# Patient Record
Sex: Female | Born: 1960
Health system: Southern US, Community
[De-identification: ages and names within clinical notes are randomized; demographics above are authoritative.]

## PROBLEM LIST (undated history)

## (undated) DIAGNOSIS — B019 Varicella without complication: Secondary | ICD-10-CM

## (undated) DIAGNOSIS — F909 Attention-deficit hyperactivity disorder, unspecified type: Secondary | ICD-10-CM

## (undated) DIAGNOSIS — G20 Parkinson's disease: Secondary | ICD-10-CM

## (undated) DIAGNOSIS — J45909 Unspecified asthma, uncomplicated: Secondary | ICD-10-CM

## (undated) DIAGNOSIS — R0609 Other forms of dyspnea: Secondary | ICD-10-CM

## (undated) DIAGNOSIS — D122 Benign neoplasm of ascending colon: Secondary | ICD-10-CM

## (undated) DIAGNOSIS — G8929 Other chronic pain: Secondary | ICD-10-CM

## (undated) DIAGNOSIS — R7303 Prediabetes: Secondary | ICD-10-CM

## (undated) DIAGNOSIS — I219 Acute myocardial infarction, unspecified: Secondary | ICD-10-CM

## (undated) DIAGNOSIS — I252 Old myocardial infarction: Secondary | ICD-10-CM

## (undated) DIAGNOSIS — F319 Bipolar disorder, unspecified: Secondary | ICD-10-CM

## (undated) DIAGNOSIS — I639 Cerebral infarction, unspecified: Secondary | ICD-10-CM

## (undated) DIAGNOSIS — B977 Papillomavirus as the cause of diseases classified elsewhere: Secondary | ICD-10-CM

## (undated) DIAGNOSIS — M1990 Unspecified osteoarthritis, unspecified site: Secondary | ICD-10-CM

## (undated) DIAGNOSIS — G2581 Restless legs syndrome: Secondary | ICD-10-CM

## (undated) DIAGNOSIS — K635 Polyp of colon: Secondary | ICD-10-CM

## (undated) DIAGNOSIS — G20A1 Parkinson's disease without dyskinesia, without mention of fluctuations: Secondary | ICD-10-CM

## (undated) DIAGNOSIS — F32A Depression, unspecified: Secondary | ICD-10-CM

## (undated) DIAGNOSIS — J45901 Unspecified asthma with (acute) exacerbation: Secondary | ICD-10-CM

## (undated) DIAGNOSIS — E786 Lipoprotein deficiency: Secondary | ICD-10-CM

## (undated) DIAGNOSIS — K219 Gastro-esophageal reflux disease without esophagitis: Secondary | ICD-10-CM

## (undated) DIAGNOSIS — M109 Gout, unspecified: Secondary | ICD-10-CM

## (undated) DIAGNOSIS — M6282 Rhabdomyolysis: Secondary | ICD-10-CM

## (undated) DIAGNOSIS — R251 Tremor, unspecified: Secondary | ICD-10-CM

## (undated) DIAGNOSIS — R0600 Dyspnea, unspecified: Secondary | ICD-10-CM

## (undated) DIAGNOSIS — F329 Major depressive disorder, single episode, unspecified: Secondary | ICD-10-CM

## (undated) DIAGNOSIS — E785 Hyperlipidemia, unspecified: Secondary | ICD-10-CM

## (undated) DIAGNOSIS — J449 Chronic obstructive pulmonary disease, unspecified: Secondary | ICD-10-CM

## (undated) DIAGNOSIS — G4730 Sleep apnea, unspecified: Secondary | ICD-10-CM

## (undated) DIAGNOSIS — Z8659 Personal history of other mental and behavioral disorders: Secondary | ICD-10-CM

## (undated) DIAGNOSIS — F4310 Post-traumatic stress disorder, unspecified: Secondary | ICD-10-CM

## (undated) DIAGNOSIS — M810 Age-related osteoporosis without current pathological fracture: Secondary | ICD-10-CM

## (undated) HISTORY — DX: Depression, unspecified: F32.A

## (undated) HISTORY — PX: TUBAL LIGATION: SHX77

## (undated) HISTORY — DX: Unspecified asthma, uncomplicated: J45.909

## (undated) HISTORY — DX: Major depressive disorder, single episode, unspecified: F32.9

## (undated) HISTORY — DX: Polyp of colon: K63.5

## (undated) HISTORY — DX: Hyperlipidemia, unspecified: E78.5

## (undated) HISTORY — PX: REPLACEMENT TOTAL KNEE: SUR1224

## (undated) HISTORY — DX: Benign neoplasm of ascending colon: D12.2

## (undated) HISTORY — PX: EYE SURGERY: SHX253

## (undated) HISTORY — DX: Unspecified asthma with (acute) exacerbation: J45.901

## (undated) HISTORY — DX: Prediabetes: R73.03

## (undated) HISTORY — DX: Chronic obstructive pulmonary disease, unspecified: J44.9

## (undated) HISTORY — DX: Sleep apnea, unspecified: G47.30

## (undated) HISTORY — PX: HAND SURGERY: SHX662

## (undated) HISTORY — DX: Attention-deficit hyperactivity disorder, unspecified type: F90.9

## (undated) HISTORY — DX: Varicella without complication: B01.9

## (undated) HISTORY — DX: Gastro-esophageal reflux disease without esophagitis: K21.9

## (undated) HISTORY — DX: Parkinson's disease without dyskinesia, without mention of fluctuations: G20.A1

## (undated) HISTORY — DX: Lipoprotein deficiency: E78.6

## (undated) HISTORY — PX: BACK SURGERY: SHX140

## (undated) HISTORY — PX: JOINT REPLACEMENT: SHX530

## (undated) HISTORY — DX: Parkinson's disease: G20

## (undated) HISTORY — DX: Old myocardial infarction: I25.2

---
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
## 2004-01-06 ENCOUNTER — Emergency Department: Payer: Self-pay | Admitting: Unknown Physician Specialty

## 2008-10-16 ENCOUNTER — Ambulatory Visit: Payer: Self-pay | Admitting: Internal Medicine

## 2009-04-08 ENCOUNTER — Emergency Department: Payer: Self-pay | Admitting: Emergency Medicine

## 2009-04-11 ENCOUNTER — Emergency Department: Payer: Self-pay | Admitting: Emergency Medicine

## 2009-04-19 ENCOUNTER — Emergency Department: Payer: Self-pay | Admitting: Unknown Physician Specialty

## 2010-02-08 ENCOUNTER — Ambulatory Visit: Payer: Self-pay | Admitting: Family Medicine

## 2010-02-28 ENCOUNTER — Emergency Department: Payer: Self-pay | Admitting: Internal Medicine

## 2010-05-02 ENCOUNTER — Ambulatory Visit: Payer: Self-pay | Admitting: Family Medicine

## 2010-06-19 ENCOUNTER — Ambulatory Visit: Payer: Self-pay | Admitting: Sports Medicine

## 2010-06-23 ENCOUNTER — Emergency Department: Payer: Self-pay | Admitting: Unknown Physician Specialty

## 2010-08-30 ENCOUNTER — Emergency Department: Payer: Self-pay | Admitting: Internal Medicine

## 2010-10-01 ENCOUNTER — Emergency Department: Payer: Self-pay | Admitting: Emergency Medicine

## 2010-10-09 ENCOUNTER — Emergency Department: Payer: Self-pay | Admitting: *Deleted

## 2010-10-16 ENCOUNTER — Emergency Department: Payer: Self-pay | Admitting: Emergency Medicine

## 2010-11-18 ENCOUNTER — Emergency Department (HOSPITAL_COMMUNITY): Payer: Medicare Other

## 2010-11-18 ENCOUNTER — Emergency Department (HOSPITAL_COMMUNITY)
Admission: EM | Admit: 2010-11-18 | Discharge: 2010-11-18 | Disposition: A | Discharge status: Home / Self Care | Payer: Medicare Other | Attending: Emergency Medicine | Admitting: Emergency Medicine

## 2010-11-18 DIAGNOSIS — G8929 Other chronic pain: Secondary | ICD-10-CM | POA: Insufficient documentation

## 2010-11-18 DIAGNOSIS — R071 Chest pain on breathing: Secondary | ICD-10-CM | POA: Insufficient documentation

## 2010-11-18 DIAGNOSIS — Z79899 Other long term (current) drug therapy: Secondary | ICD-10-CM | POA: Insufficient documentation

## 2010-11-18 DIAGNOSIS — F17200 Nicotine dependence, unspecified, uncomplicated: Secondary | ICD-10-CM | POA: Insufficient documentation

## 2010-11-18 DIAGNOSIS — J90 Pleural effusion, not elsewhere classified: Secondary | ICD-10-CM | POA: Insufficient documentation

## 2010-11-18 LAB — DIFFERENTIAL
Basophils Relative: 0 % (ref 0–1)
Eosinophils Absolute: 0.2 10*3/uL (ref 0.0–0.7)
Eosinophils Relative: 3 % (ref 0–5)
Monocytes Relative: 8 % (ref 3–12)
Neutrophils Relative %: 54 % (ref 43–77)

## 2010-11-18 LAB — CBC
MCH: 31.8 pg (ref 26.0–34.0)
Platelets: 234 10*3/uL (ref 150–400)
RBC: 4.66 MIL/uL (ref 3.87–5.11)
RDW: 12.3 % (ref 11.5–15.5)
WBC: 6.9 10*3/uL (ref 4.0–10.5)

## 2010-11-18 LAB — POCT I-STAT TROPONIN I: Troponin i, poc: 0.02 ng/mL (ref 0.00–0.08)

## 2010-11-18 LAB — POCT I-STAT, CHEM 8
Calcium, Ion: 1.21 mmol/L (ref 1.12–1.32)
HCT: 45 % (ref 36.0–46.0)
TCO2: 24 mmol/L (ref 0–100)

## 2010-11-18 MED ORDER — IOHEXOL 300 MG/ML  SOLN: 80.0000 mL | Freq: Once | INTRAMUSCULAR | Status: DC | PRN

## 2010-12-02 ENCOUNTER — Emergency Department (HOSPITAL_COMMUNITY)
Admission: EM | Admit: 2010-12-02 | Discharge: 2010-12-02 | Disposition: A | Discharge status: Home / Self Care | Payer: Medicare Other | Attending: Emergency Medicine | Admitting: Emergency Medicine

## 2010-12-02 DIAGNOSIS — W010XXA Fall on same level from slipping, tripping and stumbling without subsequent striking against object, initial encounter: Secondary | ICD-10-CM | POA: Insufficient documentation

## 2010-12-02 DIAGNOSIS — F319 Bipolar disorder, unspecified: Secondary | ICD-10-CM | POA: Insufficient documentation

## 2010-12-02 DIAGNOSIS — G8929 Other chronic pain: Secondary | ICD-10-CM | POA: Insufficient documentation

## 2010-12-02 DIAGNOSIS — F988 Other specified behavioral and emotional disorders with onset usually occurring in childhood and adolescence: Secondary | ICD-10-CM | POA: Insufficient documentation

## 2010-12-02 DIAGNOSIS — M549 Dorsalgia, unspecified: Secondary | ICD-10-CM | POA: Insufficient documentation

## 2011-02-12 ENCOUNTER — Emergency Department: Payer: Self-pay | Admitting: Emergency Medicine

## 2011-02-19 ENCOUNTER — Emergency Department: Payer: Self-pay | Admitting: Emergency Medicine

## 2011-02-28 ENCOUNTER — Emergency Department: Payer: Self-pay | Admitting: Emergency Medicine

## 2011-02-28 LAB — CBC
HCT: 36.1 % (ref 35.0–47.0)
HGB: 12.4 g/dL (ref 12.0–16.0)
MCV: 95 fL (ref 80–100)
RBC: 3.82 10*6/uL (ref 3.80–5.20)
WBC: 4.9 10*3/uL (ref 3.6–11.0)

## 2011-02-28 LAB — COMPREHENSIVE METABOLIC PANEL
Albumin: 3.4 g/dL (ref 3.4–5.0)
BUN: 11 mg/dL (ref 7–18)
EGFR (African American): 49 — ABNORMAL LOW
Glucose: 92 mg/dL (ref 65–99)
SGOT(AST): 54 U/L — ABNORMAL HIGH (ref 15–37)
SGPT (ALT): 57 U/L
Total Protein: 6.5 g/dL (ref 6.4–8.2)

## 2011-02-28 LAB — CK TOTAL AND CKMB (NOT AT ARMC): CK, Total: 382 U/L — ABNORMAL HIGH (ref 21–215)

## 2011-07-31 DIAGNOSIS — Z87898 Personal history of other specified conditions: Secondary | ICD-10-CM | POA: Insufficient documentation

## 2011-07-31 DIAGNOSIS — Z85828 Personal history of other malignant neoplasm of skin: Secondary | ICD-10-CM | POA: Insufficient documentation

## 2011-12-06 ENCOUNTER — Emergency Department: Payer: Self-pay | Admitting: Emergency Medicine

## 2012-01-02 ENCOUNTER — Ambulatory Visit: Payer: Self-pay | Admitting: Pain Medicine

## 2012-02-26 ENCOUNTER — Ambulatory Visit: Payer: Self-pay | Admitting: Cardiology

## 2012-02-27 HISTORY — PX: SPINAL CORD STIMULATOR REMOVAL: SHX2423

## 2012-02-27 HISTORY — PX: SPINAL CORD STIMULATOR INSERTION: SHX5378

## 2012-03-21 ENCOUNTER — Emergency Department: Payer: Self-pay | Admitting: Emergency Medicine

## 2012-04-07 ENCOUNTER — Ambulatory Visit: Payer: Self-pay | Admitting: Pain Medicine

## 2012-04-07 ENCOUNTER — Ambulatory Visit: Payer: Self-pay | Admitting: General Practice

## 2012-04-07 LAB — BASIC METABOLIC PANEL
BUN: 12 mg/dL (ref 7–18)
Calcium, Total: 9.1 mg/dL (ref 8.5–10.1)
Chloride: 101 mmol/L (ref 98–107)
Co2: 29 mmol/L (ref 21–32)
Creatinine: 1.02 mg/dL (ref 0.60–1.30)
EGFR (African American): >60
EGFR (Non-African Amer.): >60
Potassium: 4.4 mmol/L (ref 3.5–5.1)
Sodium: 135 mmol/L — ABNORMAL LOW (ref 136–145)

## 2012-04-07 LAB — CBC WITH DIFFERENTIAL/PLATELET
Eosinophil #: 0.4 10*3/uL (ref 0.0–0.7)
Eosinophil %: 5.8 %
HCT: 41.5 % (ref 35.0–47.0)
HGB: 14.1 g/dL (ref 12.0–16.0)
Lymphocyte #: 2.6 10*3/uL (ref 1.0–3.6)
MCHC: 34 g/dL (ref 32.0–36.0)
MCV: 94 fL (ref 80–100)
Monocyte #: 0.6 x10 3/mm (ref 0.2–0.9)
Monocyte %: 8.6 %
Neutrophil #: 3.5 10*3/uL (ref 1.4–6.5)
Neutrophil %: 49 %
Platelet: 195 10*3/uL (ref 150–440)
RBC: 4.43 10*6/uL (ref 3.80–5.20)
WBC: 7.1 10*3/uL (ref 3.6–11.0)

## 2012-04-16 ENCOUNTER — Emergency Department: Payer: Self-pay | Admitting: Emergency Medicine

## 2012-04-25 ENCOUNTER — Ambulatory Visit: Payer: Self-pay | Admitting: General Practice

## 2012-05-02 ENCOUNTER — Emergency Department: Payer: Self-pay | Admitting: Emergency Medicine

## 2012-05-03 ENCOUNTER — Emergency Department: Payer: Self-pay | Admitting: Emergency Medicine

## 2012-05-05 ENCOUNTER — Ambulatory Visit: Payer: Self-pay | Admitting: Pain Medicine

## 2012-06-04 ENCOUNTER — Ambulatory Visit: Payer: Self-pay | Admitting: Pain Medicine

## 2012-06-18 ENCOUNTER — Emergency Department: Payer: Self-pay | Admitting: Emergency Medicine

## 2012-06-19 LAB — CBC WITH DIFFERENTIAL/PLATELET
Basophil #: 0.1 10*3/uL (ref 0.0–0.1)
HCT: 39.5 % (ref 35.0–47.0)
HGB: 13.4 g/dL (ref 12.0–16.0)
Lymphocyte #: 2.6 10*3/uL (ref 1.0–3.6)
Neutrophil #: 2.9 10*3/uL (ref 1.4–6.5)
Neutrophil %: 42.9 %
Platelet: 211 10*3/uL (ref 150–440)
RBC: 4.24 10*6/uL (ref 3.80–5.20)
WBC: 6.7 10*3/uL (ref 3.6–11.0)

## 2012-06-19 LAB — BASIC METABOLIC PANEL
Anion Gap: 5 — ABNORMAL LOW (ref 7–16)
Calcium, Total: 9 mg/dL (ref 8.5–10.1)
Co2: 30 mmol/L (ref 21–32)
Creatinine: 0.88 mg/dL (ref 0.60–1.30)
EGFR (African American): >60
EGFR (Non-African Amer.): >60
Osmolality: 276 (ref 275–301)
Potassium: 3.9 mmol/L (ref 3.5–5.1)
Sodium: 139 mmol/L (ref 136–145)

## 2012-06-19 LAB — PRO B NATRIURETIC PEPTIDE: B-Type Natriuretic Peptide: 209 pg/mL — ABNORMAL HIGH (ref 0–125)

## 2012-07-11 ENCOUNTER — Ambulatory Visit: Payer: Self-pay | Admitting: Pain Medicine

## 2012-08-05 ENCOUNTER — Ambulatory Visit: Payer: Self-pay | Admitting: Pain Medicine

## 2012-08-15 ENCOUNTER — Emergency Department: Payer: Self-pay | Admitting: Emergency Medicine

## 2012-08-15 LAB — CBC
MCH: 31.8 pg (ref 26.0–34.0)
MCHC: 34.3 g/dL (ref 32.0–36.0)
Platelet: 187 10*3/uL (ref 150–440)

## 2012-08-15 LAB — BASIC METABOLIC PANEL
Calcium, Total: 9 mg/dL (ref 8.5–10.1)
Co2: 28 mmol/L (ref 21–32)
EGFR (Non-African Amer.): >60
Glucose: 98 mg/dL (ref 65–99)
Osmolality: 277 (ref 275–301)

## 2012-09-05 ENCOUNTER — Ambulatory Visit: Payer: Self-pay | Admitting: Pain Medicine

## 2012-09-07 ENCOUNTER — Emergency Department: Payer: Self-pay | Admitting: Emergency Medicine

## 2012-09-26 ENCOUNTER — Ambulatory Visit: Payer: Self-pay | Admitting: Pain Medicine

## 2012-09-30 ENCOUNTER — Ambulatory Visit: Payer: Self-pay | Admitting: Pain Medicine

## 2012-10-31 ENCOUNTER — Ambulatory Visit: Payer: Self-pay | Admitting: Pain Medicine

## 2012-12-15 ENCOUNTER — Emergency Department: Payer: Self-pay | Admitting: Emergency Medicine

## 2012-12-15 LAB — COMPREHENSIVE METABOLIC PANEL
BUN: 9 mg/dL (ref 7–18)
Chloride: 105 mmol/L (ref 98–107)
Co2: 28 mmol/L (ref 21–32)
Creatinine: 0.97 mg/dL (ref 0.60–1.30)
EGFR (Non-African Amer.): >60
Glucose: 86 mg/dL (ref 65–99)
Osmolality: 270 (ref 275–301)
SGOT(AST): 32 U/L (ref 15–37)
SGPT (ALT): 30 U/L (ref 12–78)
Sodium: 136 mmol/L (ref 136–145)
Total Protein: 7.3 g/dL (ref 6.4–8.2)

## 2012-12-19 ENCOUNTER — Ambulatory Visit: Payer: Self-pay | Admitting: Family Medicine

## 2012-12-26 ENCOUNTER — Emergency Department: Payer: Self-pay | Admitting: Internal Medicine

## 2013-01-01 ENCOUNTER — Ambulatory Visit: Payer: Self-pay

## 2013-02-20 ENCOUNTER — Emergency Department: Payer: Self-pay | Admitting: Emergency Medicine

## 2013-05-27 ENCOUNTER — Ambulatory Visit: Payer: Self-pay | Admitting: General Practice

## 2013-05-27 LAB — CBC
HCT: 39.4 % (ref 35.0–47.0)
HGB: 13.4 g/dL (ref 12.0–16.0)
MCH: 31.2 pg (ref 26.0–34.0)
MCHC: 33.9 g/dL (ref 32.0–36.0)
MCV: 92 fL (ref 80–100)
PLATELETS: 205 10*3/uL (ref 150–440)
RBC: 4.28 10*6/uL (ref 3.80–5.20)
RDW: 14.3 % (ref 11.5–14.5)
WBC: 7.9 10*3/uL (ref 3.6–11.0)

## 2013-05-27 LAB — URINALYSIS, COMPLETE
BILIRUBIN, UR: NEGATIVE
BLOOD: NEGATIVE
Glucose,UR: NEGATIVE mg/dL (ref 0–75)
Ketone: NEGATIVE
NITRITE: POSITIVE
PROTEIN: NEGATIVE
Ph: 6 (ref 4.5–8.0)
RBC,UR: NONE SEEN /HPF (ref 0–5)
Specific Gravity: 1.008 (ref 1.003–1.030)
Squamous Epithelial: 1

## 2013-05-27 LAB — BASIC METABOLIC PANEL
ANION GAP: 3 — AB (ref 7–16)
BUN: 13 mg/dL (ref 7–18)
CHLORIDE: 103 mmol/L (ref 98–107)
CREATININE: 1.06 mg/dL (ref 0.60–1.30)
Calcium, Total: 9.2 mg/dL (ref 8.5–10.1)
Co2: 33 mmol/L — ABNORMAL HIGH (ref 21–32)
EGFR (African American): >60
EGFR (Non-African Amer.): 60 — ABNORMAL LOW
Glucose: 117 mg/dL — ABNORMAL HIGH (ref 65–99)
OSMOLALITY: 279 (ref 275–301)
Potassium: 3.8 mmol/L (ref 3.5–5.1)
Sodium: 139 mmol/L (ref 136–145)

## 2013-05-27 LAB — PROTIME-INR
INR: 0.9
Prothrombin Time: 11.7 secs (ref 11.5–14.7)

## 2013-05-27 LAB — APTT: Activated PTT: 37.9 secs — ABNORMAL HIGH (ref 23.6–35.9)

## 2013-05-27 LAB — SEDIMENTATION RATE: ERYTHROCYTE SED RATE: 18 mm/h (ref 0–30)

## 2013-05-27 LAB — MRSA PCR SCREENING

## 2013-05-29 LAB — URINE CULTURE

## 2013-06-03 ENCOUNTER — Inpatient Hospital Stay: Payer: Self-pay | Admitting: General Practice

## 2013-06-04 LAB — BASIC METABOLIC PANEL
Anion Gap: 8 (ref 7–16)
BUN: 9 mg/dL (ref 7–18)
Calcium, Total: 8.1 mg/dL — ABNORMAL LOW (ref 8.5–10.1)
Chloride: 103 mmol/L (ref 98–107)
Co2: 25 mmol/L (ref 21–32)
Creatinine: 1.19 mg/dL (ref 0.60–1.30)
EGFR (African American): >60
EGFR (Non-African Amer.): 52 — ABNORMAL LOW
Glucose: 169 mg/dL — ABNORMAL HIGH (ref 65–99)
OSMOLALITY: 275 (ref 275–301)
Potassium: 4.2 mmol/L (ref 3.5–5.1)
Sodium: 136 mmol/L (ref 136–145)

## 2013-06-04 LAB — PLATELET COUNT: PLATELETS: 201 10*3/uL (ref 150–440)

## 2013-06-04 LAB — HEMOGLOBIN: HGB: 11.5 g/dL — ABNORMAL LOW (ref 12.0–16.0)

## 2013-06-05 LAB — BASIC METABOLIC PANEL
ANION GAP: 0 — AB (ref 7–16)
BUN: 7 mg/dL (ref 7–18)
CHLORIDE: 105 mmol/L (ref 98–107)
CO2: 34 mmol/L — AB (ref 21–32)
CREATININE: 0.83 mg/dL (ref 0.60–1.30)
Calcium, Total: 7.5 mg/dL — ABNORMAL LOW (ref 8.5–10.1)
EGFR (African American): >60
EGFR (Non-African Amer.): >60
Glucose: 99 mg/dL (ref 65–99)
Osmolality: 276 (ref 275–301)
Potassium: 4.2 mmol/L (ref 3.5–5.1)
SODIUM: 139 mmol/L (ref 136–145)

## 2013-06-05 LAB — HEMOGLOBIN: HGB: 10.4 g/dL — ABNORMAL LOW (ref 12.0–16.0)

## 2013-06-05 LAB — PLATELET COUNT: Platelet: 181 10*3/uL (ref 150–440)

## 2013-07-15 DIAGNOSIS — M1712 Unilateral primary osteoarthritis, left knee: Secondary | ICD-10-CM | POA: Insufficient documentation

## 2013-07-15 DIAGNOSIS — M179 Osteoarthritis of knee, unspecified: Secondary | ICD-10-CM | POA: Insufficient documentation

## 2013-07-15 DIAGNOSIS — M1710 Unilateral primary osteoarthritis, unspecified knee: Secondary | ICD-10-CM | POA: Insufficient documentation

## 2013-07-17 DIAGNOSIS — B977 Papillomavirus as the cause of diseases classified elsewhere: Secondary | ICD-10-CM | POA: Insufficient documentation

## 2013-07-17 HISTORY — DX: Papillomavirus as the cause of diseases classified elsewhere: B97.7

## 2013-08-10 ENCOUNTER — Emergency Department: Payer: Self-pay | Admitting: Emergency Medicine

## 2013-10-20 ENCOUNTER — Observation Stay: Payer: Self-pay | Admitting: Internal Medicine

## 2013-10-20 LAB — CBC
HCT: 40.8 % (ref 35.0–47.0)
HGB: 13.8 g/dL (ref 12.0–16.0)
MCH: 30.8 pg (ref 26.0–34.0)
MCHC: 33.8 g/dL (ref 32.0–36.0)
MCV: 91 fL (ref 80–100)
PLATELETS: 196 10*3/uL (ref 150–440)
RBC: 4.48 10*6/uL (ref 3.80–5.20)
RDW: 14.3 % (ref 11.5–14.5)
WBC: 10.9 10*3/uL (ref 3.6–11.0)

## 2013-10-20 LAB — CK TOTAL AND CKMB (NOT AT ARMC)
CK, Total: 101 U/L
CK-MB: 2.9 ng/mL (ref 0.5–3.6)

## 2013-10-20 LAB — COMPREHENSIVE METABOLIC PANEL
ALBUMIN: 3.6 g/dL (ref 3.4–5.0)
ALT: 51 U/L
AST: 42 U/L — AB (ref 15–37)
Alkaline Phosphatase: 114 U/L
Anion Gap: 12 (ref 7–16)
BILIRUBIN TOTAL: 0.5 mg/dL (ref 0.2–1.0)
BUN: 10 mg/dL (ref 7–18)
CHLORIDE: 103 mmol/L (ref 98–107)
CO2: 25 mmol/L (ref 21–32)
Calcium, Total: 8.7 mg/dL (ref 8.5–10.1)
Creatinine: 1.01 mg/dL (ref 0.60–1.30)
EGFR (Non-African Amer.): >60
GLUCOSE: 93 mg/dL (ref 65–99)
Osmolality: 278 (ref 275–301)
Potassium: 3.6 mmol/L (ref 3.5–5.1)
Sodium: 140 mmol/L (ref 136–145)
TOTAL PROTEIN: 7.4 g/dL (ref 6.4–8.2)

## 2013-10-20 LAB — TROPONIN I: Troponin-I: <0.02 ng/mL

## 2013-10-20 LAB — D-DIMER(ARMC): D-Dimer: 1117 ng/ml

## 2013-10-21 LAB — TROPONIN I: Troponin-I: <0.02 ng/mL

## 2013-10-21 LAB — LIPID PANEL
Cholesterol: 169 mg/dL (ref 0–200)
HDL: 35 mg/dL — AB (ref 40–60)
Ldl Cholesterol, Calc: 98 mg/dL (ref 0–100)
TRIGLYCERIDES: 182 mg/dL (ref 0–200)
VLDL Cholesterol, Calc: 36 mg/dL (ref 5–40)

## 2013-10-21 LAB — TSH: THYROID STIMULATING HORM: 1.05 u[IU]/mL

## 2013-10-21 LAB — HEMOGLOBIN A1C: Hemoglobin A1C: 5.7 % (ref 4.2–6.3)

## 2013-10-21 LAB — CK TOTAL AND CKMB (NOT AT ARMC)
CK, Total: 93 U/L
CK-MB: 2.6 ng/mL (ref 0.5–3.6)

## 2013-10-24 LAB — EXPECTORATED SPUTUM ASSESSMENT W REFEX TO RESP CULTURE

## 2013-11-18 ENCOUNTER — Ambulatory Visit: Payer: Self-pay | Admitting: Specialist

## 2013-11-18 DIAGNOSIS — J449 Chronic obstructive pulmonary disease, unspecified: Secondary | ICD-10-CM

## 2013-11-18 HISTORY — DX: Chronic obstructive pulmonary disease, unspecified: J44.9

## 2013-11-24 DIAGNOSIS — R519 Headache, unspecified: Secondary | ICD-10-CM | POA: Insufficient documentation

## 2013-11-24 DIAGNOSIS — H539 Unspecified visual disturbance: Secondary | ICD-10-CM | POA: Insufficient documentation

## 2013-11-24 DIAGNOSIS — R51 Headache: Secondary | ICD-10-CM

## 2013-11-24 DIAGNOSIS — R2689 Other abnormalities of gait and mobility: Secondary | ICD-10-CM | POA: Insufficient documentation

## 2013-11-24 DIAGNOSIS — F411 Generalized anxiety disorder: Secondary | ICD-10-CM | POA: Insufficient documentation

## 2013-11-24 DIAGNOSIS — H538 Other visual disturbances: Secondary | ICD-10-CM | POA: Insufficient documentation

## 2014-01-13 ENCOUNTER — Ambulatory Visit: Payer: Self-pay | Admitting: Family Medicine

## 2014-01-14 ENCOUNTER — Emergency Department: Payer: Self-pay | Admitting: Emergency Medicine

## 2014-01-29 ENCOUNTER — Emergency Department: Payer: Self-pay | Admitting: Emergency Medicine

## 2014-02-26 HISTORY — PX: REPLACEMENT TOTAL KNEE: SUR1224

## 2014-02-26 HISTORY — PX: JOINT REPLACEMENT: SHX530

## 2014-04-17 ENCOUNTER — Inpatient Hospital Stay: Payer: Self-pay | Admitting: Internal Medicine

## 2014-04-19 ENCOUNTER — Ambulatory Visit: Payer: Self-pay | Admitting: Neurology

## 2014-05-26 ENCOUNTER — Emergency Department
Admit: 2014-05-26 | Disposition: A | Discharge status: Home / Self Care | Payer: Self-pay | Admitting: Emergency Medicine

## 2014-06-04 DIAGNOSIS — F3189 Other bipolar disorder: Secondary | ICD-10-CM | POA: Diagnosis not present

## 2014-06-07 ENCOUNTER — Other Ambulatory Visit: Payer: Self-pay

## 2014-06-07 DIAGNOSIS — F3189 Other bipolar disorder: Secondary | ICD-10-CM | POA: Diagnosis not present

## 2014-06-07 NOTE — Patient Outreach (Signed)
[facility]) Care Management  06/07/2014  Deniece Rankin Bethard 04/30/1960 [id number]  RN CM attempted to reach patient to discuss the services of [facility].  Unable to reach patient and unable to leave message.   RN CM will try again to reach patient at a later date.  Maury Dus, RN, Ishmael Holter, [place] Telephonic Care Coordinator [phone]

## 2014-06-08 ENCOUNTER — Other Ambulatory Visit: Payer: Self-pay

## 2014-06-08 DIAGNOSIS — F3189 Other bipolar disorder: Secondary | ICD-10-CM | POA: Diagnosis not present

## 2014-06-08 NOTE — Patient Outreach (Signed)
[facility]) Care Management  06/08/2014  Nicole Austin Aug 20, 1960 [id number]  RN CM spoke with patient to discuss the services of [facility].  RN CM explained to patient that her primary doctor had made a referral.  Patient stated she was having a hard time this morning. Patient stated her 56 year old nephew passed away suddenly.  Patient stated she rolled off her couch last night onto the floor and that is where she slept until her neighbor came this morning. Patient speech was slurred and at times hard to understand.  Patient gave RN CM permission to speak with her neighbor, Nicole Austin.  RN CM manager asked [name] was patient hurt from the fall and needed medical attention.  [name] stated no the patient has done this many times before and that she would be all right. RN CM asked why was patient's speech slurred. [name] stated patient had taken her morning medications.  She stated patient has a CNA named Barnett Applebaum who helps patient 5 days a week with personal and companion care services.  States the agency is [employer] an [employer] services [employer].  [name] asked that RN CM call patient back after 2 pm today and speak with patient.  She states patient will be more alert and will be able engage with RN CM.  Patient agrees to being called back later.    Maury Dus, RN, Ishmael Holter, [place] Telephonic Care Coordinator [phone]

## 2014-06-09 ENCOUNTER — Other Ambulatory Visit: Payer: Self-pay

## 2014-06-09 DIAGNOSIS — F3189 Other bipolar disorder: Secondary | ICD-10-CM | POA: Diagnosis not present

## 2014-06-09 NOTE — Patient Outreach (Signed)
[facility]) Care Management  06/09/2014  Nicole Austin 08/30/1960 [id number]   RN CM attempted outreach call to patient to discuss the services of [facility].  Patient was no available and a HIPPA compliant voice mail message left with return call back number.  RN CM will try again at a later date.  Maury Dus, RN, Ishmael Holter, [place] Telephonic Care Coordinator [phone]

## 2014-06-10 DIAGNOSIS — F3189 Other bipolar disorder: Secondary | ICD-10-CM | POA: Diagnosis not present

## 2014-06-11 DIAGNOSIS — F3189 Other bipolar disorder: Secondary | ICD-10-CM | POA: Diagnosis not present

## 2014-06-14 ENCOUNTER — Inpatient Hospital Stay
Admit: 2014-06-14 | Disposition: A | Discharge status: Home / Self Care | Payer: Self-pay | Attending: Internal Medicine | Admitting: Internal Medicine

## 2014-06-14 DIAGNOSIS — Z87891 Personal history of nicotine dependence: Secondary | ICD-10-CM | POA: Diagnosis not present

## 2014-06-14 DIAGNOSIS — M25571 Pain in right ankle and joints of right foot: Secondary | ICD-10-CM | POA: Diagnosis not present

## 2014-06-14 DIAGNOSIS — S0990XA Unspecified injury of head, initial encounter: Secondary | ICD-10-CM | POA: Diagnosis not present

## 2014-06-14 DIAGNOSIS — F319 Bipolar disorder, unspecified: Secondary | ICD-10-CM | POA: Diagnosis not present

## 2014-06-14 DIAGNOSIS — R319 Hematuria, unspecified: Secondary | ICD-10-CM | POA: Diagnosis not present

## 2014-06-14 DIAGNOSIS — M79621 Pain in right upper arm: Secondary | ICD-10-CM | POA: Diagnosis not present

## 2014-06-14 DIAGNOSIS — J449 Chronic obstructive pulmonary disease, unspecified: Secondary | ICD-10-CM | POA: Diagnosis not present

## 2014-06-14 DIAGNOSIS — R312 Other microscopic hematuria: Secondary | ICD-10-CM | POA: Diagnosis not present

## 2014-06-14 DIAGNOSIS — S79911A Unspecified injury of right hip, initial encounter: Secondary | ICD-10-CM | POA: Diagnosis not present

## 2014-06-14 DIAGNOSIS — M1990 Unspecified osteoarthritis, unspecified site: Secondary | ICD-10-CM | POA: Diagnosis not present

## 2014-06-14 DIAGNOSIS — F3189 Other bipolar disorder: Secondary | ICD-10-CM | POA: Diagnosis not present

## 2014-06-14 DIAGNOSIS — E872 Acidosis: Secondary | ICD-10-CM | POA: Diagnosis not present

## 2014-06-14 DIAGNOSIS — R4182 Altered mental status, unspecified: Secondary | ICD-10-CM | POA: Diagnosis not present

## 2014-06-14 DIAGNOSIS — G9340 Encephalopathy, unspecified: Secondary | ICD-10-CM | POA: Diagnosis not present

## 2014-06-14 DIAGNOSIS — M25551 Pain in right hip: Secondary | ICD-10-CM | POA: Diagnosis not present

## 2014-06-14 DIAGNOSIS — M6282 Rhabdomyolysis: Secondary | ICD-10-CM | POA: Diagnosis not present

## 2014-06-14 DIAGNOSIS — S4991XA Unspecified injury of right shoulder and upper arm, initial encounter: Secondary | ICD-10-CM | POA: Diagnosis not present

## 2014-06-14 DIAGNOSIS — R32 Unspecified urinary incontinence: Secondary | ICD-10-CM | POA: Diagnosis not present

## 2014-06-14 DIAGNOSIS — R410 Disorientation, unspecified: Secondary | ICD-10-CM | POA: Diagnosis not present

## 2014-06-14 DIAGNOSIS — R197 Diarrhea, unspecified: Secondary | ICD-10-CM | POA: Diagnosis not present

## 2014-06-14 LAB — CBC WITH DIFFERENTIAL/PLATELET
BASOS PCT: 0.8 %
Basophil #: 0.1 10*3/uL (ref 0.0–0.1)
EOS ABS: 0.1 10*3/uL (ref 0.0–0.7)
Eosinophil %: 1.2 %
HCT: 40 % (ref 35.0–47.0)
HGB: 13 g/dL (ref 12.0–16.0)
LYMPHS ABS: 1.5 10*3/uL (ref 1.0–3.6)
Lymphocyte %: 15.2 %
MCH: 30.4 pg (ref 26.0–34.0)
MCHC: 32.5 g/dL (ref 32.0–36.0)
MCV: 93 fL (ref 80–100)
MONO ABS: 0.9 x10 3/mm (ref 0.2–0.9)
Monocyte %: 9.5 %
NEUTROS ABS: 7.2 10*3/uL — AB (ref 1.4–6.5)
Neutrophil %: 73.3 %
PLATELETS: 224 10*3/uL (ref 150–440)
RBC: 4.29 10*6/uL (ref 3.80–5.20)
RDW: 15 % — ABNORMAL HIGH (ref 11.5–14.5)
WBC: 9.8 10*3/uL (ref 3.6–11.0)

## 2014-06-14 LAB — URINALYSIS, COMPLETE
BLOOD: NEGATIVE
Bacteria: NONE SEEN
Bilirubin,UR: NEGATIVE
Glucose,UR: NEGATIVE mg/dL (ref 0–75)
Leukocyte Esterase: NEGATIVE
Nitrite: NEGATIVE
PH: 6 (ref 4.5–8.0)
Protein: 30
Specific Gravity: 1.027 (ref 1.003–1.030)
WBC UR: NONE SEEN /HPF (ref 0–5)

## 2014-06-14 LAB — LACTIC ACID, PLASMA: LACTIC ACID, VENOUS: 2.9 mmol/L — AB

## 2014-06-14 LAB — BASIC METABOLIC PANEL
ANION GAP: 12 (ref 7–16)
BUN: 14 mg/dL
CALCIUM: 8.9 mg/dL
Chloride: 102 mmol/L
Co2: 25 mmol/L
Creatinine: 0.78 mg/dL
EGFR (African American): >60
Glucose: 83 mg/dL
Potassium: 3.6 mmol/L
Sodium: 139 mmol/L

## 2014-06-14 LAB — HEPATIC FUNCTION PANEL A (ARMC)
ALBUMIN: 3 g/dL — AB
ALK PHOS: 77 U/L
AST: 56 U/L — AB
Bilirubin, Direct: 0.2 mg/dL
Bilirubin,Total: 0.9 mg/dL
Indirect Bilirubin: 0.7
SGPT (ALT): 37 U/L
TOTAL PROTEIN: 6.3 g/dL — AB

## 2014-06-14 LAB — TROPONIN I: Troponin-I: <0.03 ng/mL

## 2014-06-14 LAB — CK-MB: CK-MB: 8.5 ng/mL — ABNORMAL HIGH

## 2014-06-14 LAB — SALICYLATE LEVEL: Salicylates, Serum: <4 mg/dL

## 2014-06-14 LAB — CK: CK, Total: 2041 U/L — ABNORMAL HIGH

## 2014-06-15 LAB — COMPREHENSIVE METABOLIC PANEL
ALT: 32 U/L
AST: 40 U/L
Albumin: 2.6 g/dL — ABNORMAL LOW
Alkaline Phosphatase: 73 U/L
Anion Gap: 7 (ref 7–16)
BUN: 12 mg/dL
Bilirubin,Total: 0.7 mg/dL
CHLORIDE: 105 mmol/L
Calcium, Total: 8.1 mg/dL — ABNORMAL LOW
Co2: 24 mmol/L
Creatinine: 0.67 mg/dL
EGFR (African American): >60
GLUCOSE: 92 mg/dL
Potassium: 3.1 mmol/L — ABNORMAL LOW
SODIUM: 136 mmol/L
Total Protein: 5.5 g/dL — ABNORMAL LOW

## 2014-06-15 LAB — URINALYSIS, COMPLETE
BILIRUBIN, UR: NEGATIVE
Bacteria: NONE SEEN
Blood: NEGATIVE
Glucose,UR: NEGATIVE mg/dL (ref 0–75)
Leukocyte Esterase: NEGATIVE
NITRITE: NEGATIVE
Ph: 6 (ref 4.5–8.0)
Protein: 30
Specific Gravity: 1.024 (ref 1.003–1.030)

## 2014-06-15 LAB — DRUG SCREEN, URINE
Amphetamines, Ur Screen: NEGATIVE
Barbiturates, Ur Screen: NEGATIVE
Benzodiazepine, Ur Scrn: POSITIVE
CANNABINOID 50 NG, UR ~~LOC~~: POSITIVE
COCAINE METABOLITE, UR ~~LOC~~: NEGATIVE
MDMA (ECSTASY) UR SCREEN: NEGATIVE
Methadone, Ur Screen: NEGATIVE
Opiate, Ur Screen: POSITIVE
PHENCYCLIDINE (PCP) UR S: NEGATIVE
Tricyclic, Ur Screen: POSITIVE

## 2014-06-15 LAB — CBC WITH DIFFERENTIAL/PLATELET
Basophil #: 0 10*3/uL (ref 0.0–0.1)
Basophil %: 0.6 %
EOS PCT: 3 %
Eosinophil #: 0.2 10*3/uL (ref 0.0–0.7)
HCT: 32.9 % — AB (ref 35.0–47.0)
HGB: 10.9 g/dL — ABNORMAL LOW (ref 12.0–16.0)
LYMPHS ABS: 1.5 10*3/uL (ref 1.0–3.6)
Lymphocyte %: 21.1 %
MCH: 30.5 pg (ref 26.0–34.0)
MCHC: 33.2 g/dL (ref 32.0–36.0)
MCV: 92 fL (ref 80–100)
Monocyte #: 0.7 x10 3/mm (ref 0.2–0.9)
Monocyte %: 10.1 %
NEUTROS ABS: 4.6 10*3/uL (ref 1.4–6.5)
Neutrophil %: 65.2 %
Platelet: 219 10*3/uL (ref 150–440)
RBC: 3.58 10*6/uL — AB (ref 3.80–5.20)
RDW: 15 % — ABNORMAL HIGH (ref 11.5–14.5)
WBC: 7 10*3/uL (ref 3.6–11.0)

## 2014-06-15 LAB — MAGNESIUM: MAGNESIUM: 1.7 mg/dL

## 2014-06-15 LAB — TSH: Thyroid Stimulating Horm: 2.162 u[IU]/mL

## 2014-06-15 LAB — PHOSPHORUS: Phosphorus: 3 mg/dL

## 2014-06-15 LAB — CK: CK, Total: 853 U/L — ABNORMAL HIGH

## 2014-06-15 LAB — LACTIC ACID, PLASMA: Lactic Acid, Venous: 0.7 mmol/L

## 2014-06-16 LAB — CK: CK, Total: 321 U/L — ABNORMAL HIGH

## 2014-06-16 LAB — POTASSIUM: Potassium: 3.3 mmol/L — ABNORMAL LOW

## 2014-06-18 DIAGNOSIS — M6282 Rhabdomyolysis: Secondary | ICD-10-CM | POA: Diagnosis not present

## 2014-06-18 DIAGNOSIS — F319 Bipolar disorder, unspecified: Secondary | ICD-10-CM | POA: Diagnosis not present

## 2014-06-18 DIAGNOSIS — J449 Chronic obstructive pulmonary disease, unspecified: Secondary | ICD-10-CM | POA: Diagnosis not present

## 2014-06-18 DIAGNOSIS — M1990 Unspecified osteoarthritis, unspecified site: Secondary | ICD-10-CM | POA: Diagnosis not present

## 2014-06-18 DIAGNOSIS — F4310 Post-traumatic stress disorder, unspecified: Secondary | ICD-10-CM | POA: Diagnosis not present

## 2014-06-18 DIAGNOSIS — F3189 Other bipolar disorder: Secondary | ICD-10-CM | POA: Diagnosis not present

## 2014-06-18 DIAGNOSIS — F17210 Nicotine dependence, cigarettes, uncomplicated: Secondary | ICD-10-CM | POA: Diagnosis not present

## 2014-06-18 LAB — URINE CULTURE

## 2014-06-18 NOTE — Op Note (Signed)
PATIENT NAME:  Nicole Austin, Nicole Austin MR#:  [id number] DATE OF BIRTH:  October 19, 1960  DATE OF PROCEDURE:  04/25/2012  PREOPERATIVE DIAGNOSIS: Internal derangement of the left knee.   POSTOPERATIVE DIAGNOSES:  1.  Tear of the anterior and posterior horns of the medial meniscus, left knee.  2.  Grade 3 chondromalacia involving the medial compartment and patellofemoral compartment.   PROCEDURES PERFORMED: Left knee arthroscopy, partial medial meniscectomy and chondroplasty of the medial and patellofemoral compartments.   SURGEON: Laurice Record. Hooten, MD.   ANESTHESIA: General.   ESTIMATED BLOOD LOSS: Minimal.   DRAINS: None.   TOURNIQUET TIME: Not used.   INDICATIONS FOR SURGERY: The patient is a 54 year old female who has been seen for complaints of left knee pain and swelling. Original injury was approximately 2 years ago with a fall. More recently, the patient sustained a fall while attempting to walk outdoors in the snow. She had had some increased pain and swelling to the knee. Previous MRI demonstrated findings consistent with meniscal pathology. After discussion of the risks and benefits of surgical intervention, the patient expressed understanding of the risks and benefits and agreed with plans for surgical intervention.   PROCEDURE IN DETAIL: The patient was brought to the Operating Room and after adequate general anesthesia was achieved, a tourniquet was placed on the patient's left thigh and the leg was placed in a leg holder. All bony prominences were well padded. The patient's left knee and leg were cleaned and prepped with alcohol and DuraPrep and draped in the usual sterile fashion. A "timeout" was performed as per usual protocol. The anticipated portal sites were injected with 0.25% Marcaine with epinephrine. An anterolateral portal was created and a cannula was inserted. A moderate effusion was evacuated. The scope was inserted and the knee was distended with fluid using the Stryker pump. The  scope was advanced down the medial gutter into the medial compartment of the knee. Under visualization with the scope, an anteromedial portal was created and hook probe was inserted. Inspection of the medial compartment demonstrated a complex tear involving both the anterior and posterior horns of the medial meniscus. A flap-type lesion was countered. The areas of the tears were debrided using meniscal punches and a 4.5 mm incisor shaver. Final contouring of the meniscus was performed using a 50 degree ArthroCare wand. The remaining rim of the meniscus was visualized and probed and felt to be stable. Inspection of the articular surface demonstrated grade 3 changes of chondromalacia to both the femoral and tibial aspects. The ArthroCare wand was used to debride and contour the most severe areas of fibrillation. The scope was then advanced into the intercondylar region. The anterior cruciate ligament was visualized and probed and felt to be stable. The scope was removed and reinserted via the anteromedial portal so as to better visualize the lateral compartment. Inspection of the lateral compartment showed the articular surface to be in reasonably good condition. The lateral meniscus was visualized and probed and felt to be stable. The scope was then positioned so as to visualize the patellofemoral articulation. There were grade 3 changes noted to the intercondylar notch. The articular surface was somewhat soft in this area. Debridement was performed and contouring also performed using the 50 degree ArthroCare wand. Reasonably good patellar tracking was noted. The knee was irrigated with copious amounts of fluid and then suctioned dry. The anterolateral portal was reapproximated using 3-0 nylon. A combination of 0.25% Marcaine with epinephrine and 4 mg of morphine was injected via  the scope. The scope was removed and the anteromedial portal was reapproximated using 3-0 nylon. A sterile dressing was applied followed by  application of ice wrap.   The patient tolerated the procedure well. She was transported to the recovery room in stable condition.   Of note, the patient had apparently used all of the oxycodone that had previously been prescribed by Dr. Milinda Pointer via the pain management clinic. I spoke to Dr. Dossie Arbour and informed him of this fact. The patient was given a prescription for oxycodone 5 mg 1 to 2 tablets p.o. q.4 hours p.r.n. pain, #60 with no refills.    ____________________________ Laurice Record. Holley Bouche., MD jph:jm D: 04/26/2012 14:43:15 ET T: 04/26/2012 13:32:41 ET JOB#: [id number]  cc: Jeneen Rinks P. Holley Bouche., MD, <Dictator> Francisco A. Dossie Arbour, MD Marciano Sequin MD ELECTRONICALLY SIGNED 04/28/2012 6:55

## 2014-06-19 NOTE — Discharge Summary (Signed)
PATIENT NAME:  Nicole Austin, Nicole Austin MR#:  [id number] DATE OF BIRTH:  Apr 21, 1960  DATE OF ADMISSION:  10/20/2013 DATE OF DISCHARGE:  10/22/2013  ADMITTING DIAGNOSIS: Chest pain.   DISCHARGE DIAGNOSES:  1.  Chest pain noncardiac, likely due to hypoxia, not coronary artery disease. 2.  Chronic obstructive pulmonary disease exacerbation.  3.  Acute bronchitis.  4.  Hypoxia, resolved.  5.  Tobacco abuse, ongoing.   DISCHARGE CONDITION: Stable.   DISCHARGE MEDICATIONS:  1.  The patient is to resume gabapentin 800 mg p.o. 3 times daily.  2.  Alprazolam 2 mg 3 times daily. 3.  Seroquel XR 200 mg p.o. at bedtime.  4.  Escitalopram 20 mg p.o. daily.  5.  Cyclobenzaprine 10 mg twice daily. 6.   Lidocaine topical  5% cream 1 application to affected area as needed.  7.  ProAir HFA 2 puffs 4 times daily as needed.  8.  Prednisone taper 50 mg p.o. once on 10/23/2013, then taper by 10 mg until stopped.  9.  Levaquin 750 mg p.o. daily for 4 more days.  10.  Symbicort 160/4.5 two puffs twice daily.  11.  Habitrol transdermal film 14 mg topically daily.  12.  Nicotine oral inhaler 1 inhalation every 2 hours as needed.  13.  DuoNebs 2.5/0.5 mg in 3 mL inhalation solution 6 times daily as needed.   HOME OXYGEN: None.   DIET: Two gram salt, low fat, low cholesterol.   ACTIVITY LIMITATIONS: As tolerated.    FOLLOWUP APPOINTMENT: With Dr. Gavin Potters in 2 days after discharge.   CONSULTANTS: Care management, social work.   RADIOLOGIC STUDIES: Chest x-ray, portable single view, August 25th, showed no acute cardiopulmonary disease. CT scan of chest with IV contrast to rule out pulmonary embolism 10/20/2013 revealed no acute cardiopulmonary disease and no evidence of pulmonary embolism, small amount of pericardial fluid, 1.2 cm liver hypodensity, likely cyst. Nuclear medicine myocardial stress test 10/21/2013 showed no significant wall motion abnormality noted, normal study; overall, low risk scan.  Pharmacological myocardial perfusion study with no significant ischemia. The estimated ejection fraction was 54%. The left ventricular global function was normal. There were no EKG changes concerning for ischemia and there was no artifact noted on this study, read by Dr. Lady Gary.  HOSPITAL COURSE: The patient is a 54 year old Caucasian female with history of ongoing tobacco abuse who presents to the hospital with complaints of chest pains, as well as shortness of breath. Please refer to Dr. Nicky Pugh admission note on 10/20/2013. On arrival to the hospital, the patient's temperature was 98.8, pulse was 107, blood pressure 123/77, saturation was 93% on room air. Physical exam revealed no significant abnormalities. The patient's EKG showed normal sinus rhythm with V4 to V6 T wave inversions. The patient's laboratory data done on admission, 10/20/2013, was unremarkable except of mild elevation of AST to 42. Cardiac enzymes x 3 were within normal limits. TSH was normal at 1.05 and CBC was within normal limits. D-dimer was up to 1117. The patient was admitted to the hospital for further evaluation. Initially, she underwent a stress test after cardiac enzymes were found to be normal. Stress test was negative for ischemia. She was noted to be wheezing and very tight on auscultation on the 10/21/2013 and she was initiated on antibiotics, also steroids and inhalation therapy with which her condition significantly improved and her breathing has improved and she was weaned off oxygen. She was advised to continue steroid taper, as well as inhalation therapy and antibiotics. It  was felt that the patient had acute bronchitis and tobacco abuse related to COPD exacerbation. The patient was advised to stop smoking and this was discussed with her extensively. She was advised to follow up with her primary care physician for further recommendations. She felt satisfactory on the day of discharge, complained of no significant shortness of  breath or chest pains. She did well on exertion on room air. On the day of discharge, 10/22/2013, her vitals were stable with temperature of 97.5, pulse was 98, respirations were 18, blood pressure 120/83, saturation was 95% on 2 liters of oxygen through nasal cannula at rest and 93% on room air on exertion.   TIME SPENT: Forty minutes.  ____________________________ Katharina Caper, MD rv:TT D: 10/22/2013 14:29:55 ET T: 10/23/2013 00:10:50 ET JOB#: [id number]  cc: Katharina Caper, MD, <Dictator> Letitia Caul, MD Nohemy Koop MD ELECTRONICALLY SIGNED 11/14/2013 17:49

## 2014-06-19 NOTE — H&P (Signed)
PATIENT NAME:  Nicole Austin, JEMMOTT MR#:  [id number] DATE OF BIRTH:  Jan 15, 1961  DATE OF ADMISSION:  10/20/2013  PRIMARY CARE PHYSICIAN: Kerin Perna, MD  REFERRING PHYSICIAN: Dr. Clearnce Hasten  CHIEF COMPLAINT: Chest pain today.   HISTORY OF PRESENT ILLNESS: A 54 year old Caucasian female with a history of COPD, bipolar disorder, PTSD, depression, arthritis, presented the ED with chest pain today. The patient is alert, awake, oriented, in no acute distress. The patient said that she was fine until today. She developed chest pain at 2:00 p.m. today. Chest pain is in the substernal area, burning and squeezing, tight, intermittent without radiation. The patient also has epigastric pain and some burning sensation. The patient denies any palpitations. No diaphoresis, but has shortness of breath. The patient denies any other symptoms, but she said recently she has had stress and feels anxious and depressed.  PAST MEDICAL HISTORY: COPD, arthritis, bipolar disorder, PTSD, degenerative disk disease.   PAST SURGICAL HISTORY: Left knee replacement, back surgery.   SOCIAL HISTORY: Smokes 1 pack a day for almost 40 years. Denies any alcohol drinking or any illicit drugs.    FAMILY HISTORY: Mother had a heart attack and stroke.   ALLERGIES: ASPIRIN, CODEINE, IBUPROFEN. SHE SAID ASPIRIN UPSET HER STOMACH.   HOME MEDICATIONS: Seroquel XR 200 mg p.o. in the evening, ProAir HFA CFC free 90 mcg inhalation 2 packs 4 times p.r.n., Levaquin topical 50% topical cream apply topically to affected area p.r.n., gabapentin 800 mg p.o. t.i.d., escitalopram 20 mg p.o. daily, cyclobenzaprine 10 mg p.o. b.i.d., alprazolam 2 mg p.o. t.i.d.   REVIEW OF SYSTEMS: CONSTITUTIONAL: The patient denies any fever or chills. No headache or dizziness. No weakness.  EYES: No double vision. No blurry vision.\ EARS, NOSE AND THROAT: No postnasal drip, slurred speech or dysphagia.  CARDIOVASCULAR: Positive for chest pain. No palpitations.  No orthopnea. No nocturnal dyspnea. No leg edema.  PULMONARY: No cough, sputum, but has shortness of breath. No hemoptysis.  GASTROINTESTINAL: No abdominal pain, nausea, vomiting, diarrhea. No melena or bloody stool.  GENITOURINARY: No dysuria, hematuria or incontinence.  SKIN: No rash or jaundice.  NEUROLOGIC: No syncope, loss of consciousness or seizure.  ENDOCRINOLOGY: No polyuria, polydipsia, heat or cold intolerance.  HEMATOLOGY: No easy bruising or bleeding.  PSYCHIATRY: Has had depression and anxiety.   PHYSICAL EXAMINATION:  VITAL SIGNS: Temperature 98.8, blood pressure 123/77, pulse 107, O2 saturation 93% on room air.  GENERAL: The patient is alert, awake, oriented, in no acute distress.  HEENT: Pupils round, equal and reactive to light and accommodation. NECK: Supple. No JVD or carotid bruit. No lymphadenopathy. No thyromegaly.  CARDIOVASCULAR: S1, S2 regular rate and rhythm. No murmurs or gallops.  PULMONARY: Bilateral air entry. No wheezing or rales. No use of accessory muscle to breathe.  ABDOMEN: Soft. No distention or tenderness. No organomegaly. Bowel sounds present, obese.  EXTREMITIES: No edema, clubbing or cyanosis. No calf tenderness. Bilateral pedal pulses present.  SKIN: No rash or jaundice.  NEUROLOGIC: A and O x 3. No focal deficit. Power 5/5. Sensation intact.   LABORATORY DATA: D-dimer  1117. Chest x-ray did not show any acute cardiopulmonary disease. Glucose 93, BUN 10, creatinine 1.01. Electrolytes normal. CBC normal. Troponin less than 0.02. EKG showed normal sinus rhythm with V4 to V6 T-wave inversion.   IMPRESSION:  1.  Chest pain, need to rule out acute coronary syndrome.  2.  Elevated D-dimer, rule out pulmonary embolism and deep vein thrombosis.  3.  History of chronic obstructive  pulmonary disease, stable.  4.  Anxiety and depression.  5. Tobacco abuse.  PLAN OF TREATMENT:  1.  The patient will be placed for observation. We will give aspirin, statin  and follow up troponin level and get a stress test.  2.  Due to elevated D-dimer we will get an CT angiogram of chest to rule out PE.  3.  We will give nebulizer treatment. Continue the patient's home medication.  4. Smoking cessation was counselled for 4 min, nicotine patch.  I discussed the patient's condition and plan of treatment with the patient.   TIME SPENT: About 53 minutes.   ____________________________ Demetrios Loll, MD qc:TT D: 10/20/2013 18:06:34 ET T: 10/20/2013 19:44:47 ET JOB#: [id number]  cc: Demetrios Loll, MD, <Dictator> Demetrios Loll MD ELECTRONICALLY SIGNED 10/21/2013 21:42

## 2014-06-19 NOTE — Discharge Summary (Signed)
PATIENT NAME:  Nicole Austin, Nicole Austin MR#:  [id number] DATE OF BIRTH:  Nov 16, 1960  DATE OF ADMISSION:  06/03/2013  DATE OF DISCHARGE:  06/05/2013  ADMITTING DIAGNOSIS: Degenerative arthritis, left knee.   DISCHARGE DIAGNOSIS: Degenerative arthritis, left knee.   PROCEDURE: Left total knee arthroplasty using computer-assisted navigation.   SURGEON: Skip Estimable, MD   ASSISTANT: Rachelle Hora, PA-C.   ANESTHESIA:  Spinal.   ESTIMATED BLOOD LOSS: 50 Ml.   FLUIDS REPLACED:  1700 mL of crystalloid.   TOURNIQUET TIME: 103 minutes.   DRAINS: 2 medium drains to reinfusion system.   SOFT TISSUE RELEASES: Anterior cruciate ligament, posterior cruciate ligament, deep medial collateral ligament and patellofemoral ligament.   IMPLANTS UTILIZED:  DePuy PFC Sigma size 3 posterior stabilized femoral component with a size 2.5 MBT tibial component and a 32 mm 3 peg oval dome patella, and a 12.5 mm stabilized rotating platform polyethylene insert. Gentamicin cement was utilized.   HISTORY: The patient is 54 year old female who has had evaluation of left knee. She has had significant knee pain since 2013 that has progressively gotten worse. She had a left knee arthroscopy with partial medial meniscectomy and chondroplasty that showed grade II changes of chondromalacia involving the medial and patellofemoral compartments. Her pain is moderate to severe and aggravated with weight-bearing activities. Pain is progressively getting worse. She has had pain with rest. The knee gives away on her and she does not have much confidence with it. She tried antiinflammatory medications as well as Synvisc and cortisone injections with no relief. She has seen Dr. Marry Guan, agreed, and consented to a left total knee replacement.   PHYSICAL EXAMINATION:  GENERAL: Well-developed, well-nourished female in no apparent distress. She is obese. She presents in a wheelchair.  HEENT: Head normocephalic, atraumatic. Pupils equal, round,  and reactive to light. Patient has upper and lower dentures.  HEART: Regular rate and rhythm.  LUNGS: Clear to auscultation bilaterally. No wheezing, rales or rhonchi.  EXTREMITIES:  Left knee shows the patient has mild swelling. No warmth, erythema, or effusion. She has medial joint line tenderness but no lateral joint line tenderness. She has mild effusion. She has crepitus with range of motion. Range of motion 0-122 degrees. She has a negative anterior, posterior and  Lachman test. She has a negative McMurray's test. She has good quadriceps tone with no significant atrophy. She is able to straight leg raise and she is neurovascularly intact in the left lower extremity.   HOSPITAL COURSE: The patient was admitted to the hospital on 06/03/2013. She had surgery that same day and was brought to the orthopedic floor from the PACU in stable condition. On postop day 1, the patient's pain was controlled. Labs were normal. She did have an acute postop blood loss anemia with a hemoglobin of  11.5. This trended down to 10 on postop day 2. On postop day 2, she had good progress with physical therapy and was able to ambulate 275 feet. She is doing well. Pain control, vital signs are stable. On postoperative day 3, she was stable and ready for discharge to home with home health PT.   CONDITION AT DISCHARGE: Stable.   DISCHARGE INSTRUCTIONS: The patient may gradually increase weight-bearing on the affected extremity. Elevate the affected foot on 1-2 pillows with the foot higher than the knee. Thigh-high TED hose on both legs remove at bedtime and replace on arising the next morning. Elevate the heels off the bed. Use incentive spirometer every hour while awake. Encourage cough and  deep breathing. The patient may resume a regular diet as tolerated. Continue using Polar Care unit maintaining temperature between 40-50 degrees. Do not get the dressing or bandage wet or dirty. Call [facility] ortho if the dressing gets water under  it. Leave dressing on. Call for any fevers above 101.5 degrees, redness, swelling, or drainage at the incision. Call [facility] ortho if you experience any increased leg pain, numbness or weakness and/or bowel or bladder symptoms. Home physical therapy has been arranged for continuation of rehab program. Please call [facility] if a therapist has not contacted you within 48 hours of return home.   DISCHARGE MEDICATIONS: See discharge medications from discharge instructions.     ____________________________ T. Rachelle Hora, PA-C tcg:tc D: 06/05/2013 17:43:13 ET T: 06/05/2013 19:50:04 ET JOB#: [id number]  cc: T. Rachelle Hora, PA-C, <Dictator> Duanne Guess [place] ELECTRONICALLY SIGNED 06/19/2013 8:10

## 2014-06-19 NOTE — Op Note (Signed)
PATIENT NAME:  Nicole Austin, DOUBEK MR#:  [id number] DATE OF BIRTH:  1960-06-14  DATE OF PROCEDURE:  06/03/2013  PREOPERATIVE DIAGNOSIS: Degenerative arthrosis of the left knee.   POSTOPERATIVE DIAGNOSIS: Degenerative arthrosis of the left knee.   PROCEDURE PERFORMED: Left total knee arthroplasty using computer-assisted navigation.   SURGEON: Laurice Record. Holley Bouche., MD  ASSISTANT: Rachelle Hora, PA-C (required to maintain retraction throughout the procedure).   ANESTHESIA: Spinal.   ESTIMATED BLOOD LOSS: 50 mL.   FLUIDS REPLACED: 1700 mL of crystalloid.   TOURNIQUET TIME: 103 minutes.   DRAINS: Two medium drains to reinfusion system.   SOFT TISSUE RELEASES: Anterior cruciate ligament, posterior cruciate ligament, deep medial collateral ligament and patellofemoral ligament.   IMPLANTS UTILIZED: DePuy PFC Sigma size 3 posterior stabilized femoral component (cemented), size 2.5 MBT tibial component (cemented), 32 mm 3 peg oval dome patella (cemented) and a 12.5 mm stabilized rotating platform polyethylene insert. Gentamicin cement was utilized.   INDICATIONS FOR SURGERY: The patient is a 54 year old female who has been seen for complaints of progressive left knee pain. X-rays demonstrated significant degenerative changes in a tricompartmental fashion. After discussion of the risks and benefits of surgical intervention, the patient expressed understanding of the risks and benefits and agreed with plans for surgical intervention.   PROCEDURE IN DETAIL: The patient was brought into the operating room and, after adequate spinal anesthesia was achieved, a tourniquet was placed on the patient's upper left thigh. The patient's left knee and leg were cleaned and prepped with alcohol and DuraPrep and draped in the usual sterile fashion. A "timeout" was performed as per usual protocol. The left lower extremity was exsanguinated using an Esmarch, and the tourniquet was inflated to 300 mmHg. An anterior  longitudinal incision was made, followed by a standard mid vastus approach. A moderate effusion was evacuated. The deep fibers of the medial collateral ligament were elevated in a subperiosteal fashion off of the medial flare of the tibia so as to maintain a continuous soft tissue sleeve. The patella was subluxed laterally, and the patellofemoral ligament was incised. Inspection of the knee demonstrated significant degenerative changes in tricompartmental fashion. Prominent osteophytes were debrided using a rongeur. Anterior and posterior cruciate ligaments were excised. Two 4.0 mm Schanz pins were inserted into the femur and into the tibia for attachment of the array of trackers used for computer-assisted navigation. Hip center was identified using the circumduction technique. Distal landmarks were mapped using the computer. The distal femur and proximal tibia were mapped using the computer. Distal femoral cutting guide was positioned using computer-assisted navigation so as to achieve a 5 degree distal valgus cut. Cut was performed and verified using the computer. Distal femur was sized, and it was felt that a size 3 femoral component was appropriate. A size 3 cutting guide was positioned, and the anterior cut was performed and verified using the computer. This was followed by completion of the posterior and chamfer cuts. Femoral cutting guide for the central box was then positioned, and the central box cut was performed. Attention was then directed to the proximal tibia. Medial and lateral menisci were excised. The extramedullary tibial cutting guide was positioned using computer-assisted navigation so as to achieve 0 degree varus and valgus alignment and 0 degree posterior slope. Cut was performed and verified using the computer. The proximal tibia was sized, and it was felt that a size 2.5 tibial tray was appropriate. Tibial and femoral trials were inserted. Posterior osteophytes were debrided off of the  femoral condyles. First, a 10 and subsequently a 12.5 mm polyethylene trial was inserted. This allowed for good medial and lateral soft tissue balancing. Finally, the patella was cut and prepared so as to accommodate a 32 mm 3 peg oval dome patella. Patellar trial was placed, and the knee was placed through a range of motion with excellent patellar tracking appreciated. The femoral trial was removed. Central post hole for the tibial component was reamed, followed by insertion of a keel punch. Tibial trials were then removed. The cut surfaces of bone were irrigated with copious amounts of normal saline with antibiotic solution using pulsatile lavage and then suctioned dry. Polymethyl methacrylate cement with gentamicin was prepared in the usual fashion using a vacuum mixer. Cement was applied to the cut surface of the proximal tibia as well as along the undersurface of a size 2.5 MBT tibial component. The tibial component was positioned and impacted into place. Excess cement was removed using [employer] fast streamer. Cement was then applied to the cut surface of the femur as well as along the posterior flanges of a size 3 posterior stabilized femoral component. The femoral component was positioned and impacted into place. Excess cement was removed using [employer] fast streamer. A 12.5 mm polyethylene trial was inserted, and the knee was brought into full extension with steady axial compression applied. Finally, cement was applied to the backside of a 32 mm 3 peg oval dome patella, and the patellar component was positioned and patellar clamp applied. Excess cement was removed using [employer] fast streamer.   After adequate curing of cement, the tourniquet was deflated after a total tourniquet time of 103 minutes. Hemostasis was achieved using electrocautery. The knee was irrigated with copious amounts of normal saline with antibiotic solution using pulsatile lavage and then suctioned dry. The knee was inspected for any residual cement  debris. Then, 20 mL of 1.3% Exparel in 40 mL of normal saline was injected along the posterior capsule, medial and lateral gutters and along the arthrotomy site. A 12.5 mm stabilized rotating platform polyethylene insert was inserted, and the knee was placed through a range of motion with excellent patellar tracking appreciated and excellent medial and lateral soft tissue balancing noted. Two medium drains were placed in the wound bed and brought out through a separate stab incision to be attached to a reinfusion system. The medial parapatellar portion of the incision was reapproximated using interrupted sutures of #1 Vicryl. The subcutaneous tissue was approximated in layers using first #0 Vicryl, followed by #2-0 Vicryl. Then, 30 mL of 0.25% Marcaine with epinephrine was injected into the subcutaneous tissue along the incision site. Skin was approximated using skin staples. A sterile dressing was applied.   The patient tolerated the procedure well. She was transported to the recovery room in stable condition.    ____________________________ Laurice Record. Holley Bouche., MD jph:lb D: 06/04/2013 06:41:36 ET T: 06/04/2013 06:56:07 ET JOB#: [id number]  cc: Laurice Record. Holley Bouche., MD, <Dictator> Laurice Record Holley Bouche MD ELECTRONICALLY SIGNED 06/17/2013 11:33

## 2014-06-21 DIAGNOSIS — M1990 Unspecified osteoarthritis, unspecified site: Secondary | ICD-10-CM | POA: Diagnosis not present

## 2014-06-21 DIAGNOSIS — F4310 Post-traumatic stress disorder, unspecified: Secondary | ICD-10-CM | POA: Diagnosis not present

## 2014-06-21 DIAGNOSIS — F3189 Other bipolar disorder: Secondary | ICD-10-CM | POA: Diagnosis not present

## 2014-06-21 DIAGNOSIS — F319 Bipolar disorder, unspecified: Secondary | ICD-10-CM | POA: Diagnosis not present

## 2014-06-21 DIAGNOSIS — F17210 Nicotine dependence, cigarettes, uncomplicated: Secondary | ICD-10-CM | POA: Diagnosis not present

## 2014-06-21 DIAGNOSIS — M6282 Rhabdomyolysis: Secondary | ICD-10-CM | POA: Diagnosis not present

## 2014-06-21 DIAGNOSIS — J449 Chronic obstructive pulmonary disease, unspecified: Secondary | ICD-10-CM | POA: Diagnosis not present

## 2014-06-22 ENCOUNTER — Other Ambulatory Visit: Payer: Self-pay

## 2014-06-22 DIAGNOSIS — F319 Bipolar disorder, unspecified: Secondary | ICD-10-CM | POA: Diagnosis not present

## 2014-06-22 DIAGNOSIS — F3189 Other bipolar disorder: Secondary | ICD-10-CM | POA: Diagnosis not present

## 2014-06-22 DIAGNOSIS — F17210 Nicotine dependence, cigarettes, uncomplicated: Secondary | ICD-10-CM | POA: Diagnosis not present

## 2014-06-22 DIAGNOSIS — M6282 Rhabdomyolysis: Secondary | ICD-10-CM | POA: Diagnosis not present

## 2014-06-22 DIAGNOSIS — J441 Chronic obstructive pulmonary disease with (acute) exacerbation: Secondary | ICD-10-CM

## 2014-06-22 DIAGNOSIS — F4310 Post-traumatic stress disorder, unspecified: Secondary | ICD-10-CM | POA: Diagnosis not present

## 2014-06-22 DIAGNOSIS — J449 Chronic obstructive pulmonary disease, unspecified: Secondary | ICD-10-CM | POA: Diagnosis not present

## 2014-06-22 DIAGNOSIS — M1990 Unspecified osteoarthritis, unspecified site: Secondary | ICD-10-CM | POA: Diagnosis not present

## 2014-06-22 NOTE — Patient Outreach (Signed)
[facility]) Care Management  06/22/2014  Peytan Andringa Madrazo 11-Mar-1960 [id number]   RN CM was able to reach this patient and discuss the services of [facility].  RN CM asked patient how was she managing her COPD.  Patient reports she is taking her medications and using her inhalers as ordered.  Patient states she was in the hospital last week for falling.  States she now has a Marine scientist and a physical therapist coming to see her.  States they started yesterday and will return for several visits.  Patient states she has an Aide coming 5 days a week to help with meals and ADLs.  Patient states she recently lost her nephew unexpectedly and is still grieving the loss.  Patient states  the nurse coming from [place] to see her is enough for now.  States that is all the nursing supports needed presently.  Patient did request for a [facility] social worker to help with transportation issues.  RN CM will make a referral to social work for transportation issues and other psycho-social needs.  RN CM make a coordination of care call to the [place] nurse.    Maury Dus, RN, Ishmael Holter, [facility] Telephonic Care Coordinator [phone]

## 2014-06-23 DIAGNOSIS — F3189 Other bipolar disorder: Secondary | ICD-10-CM | POA: Diagnosis not present

## 2014-06-24 DIAGNOSIS — F4310 Post-traumatic stress disorder, unspecified: Secondary | ICD-10-CM | POA: Diagnosis not present

## 2014-06-24 DIAGNOSIS — M6282 Rhabdomyolysis: Secondary | ICD-10-CM | POA: Diagnosis not present

## 2014-06-24 DIAGNOSIS — F17210 Nicotine dependence, cigarettes, uncomplicated: Secondary | ICD-10-CM | POA: Diagnosis not present

## 2014-06-24 DIAGNOSIS — M1990 Unspecified osteoarthritis, unspecified site: Secondary | ICD-10-CM | POA: Diagnosis not present

## 2014-06-24 DIAGNOSIS — F319 Bipolar disorder, unspecified: Secondary | ICD-10-CM | POA: Diagnosis not present

## 2014-06-24 DIAGNOSIS — F3189 Other bipolar disorder: Secondary | ICD-10-CM | POA: Diagnosis not present

## 2014-06-24 DIAGNOSIS — J449 Chronic obstructive pulmonary disease, unspecified: Secondary | ICD-10-CM | POA: Diagnosis not present

## 2014-06-25 DIAGNOSIS — F3189 Other bipolar disorder: Secondary | ICD-10-CM | POA: Diagnosis not present

## 2014-06-27 NOTE — Consult Note (Addendum)
PATIENT NAME:  Nicole Austin, Nicole Austin MR#:  [id number] DATE OF BIRTH:  [birth year]  DATE OF CONSULTATION:  06/15/2014  CONSULTING PHYSICIAN:  Gonzella Lex, MD.  IDENTIFYING INFORMATION AND REASON FOR CONSULTATION:  A 54 year old woman who presented to the Hospital Emergency Room yesterday with altered mental status after having been passed out and today was very agitated.   CHIEF COMPLAINT:  "I've been under a lot of stress."   HISTORY OF PRESENT ILLNESS:  Information from the patient and the chart and speaking with nursing and social work.  Apparently the patient was brought to the Emergency Room yesterday after being found by her home health aide with the possible history of having been passed out on the floor for some number of days.  Etiology of this appeared to be unclear.  Today the patient was very agitated and several people have told me that she was quite psychotic and agitated earlier in the day.  When I came to see patient this afternoon, she was awake and alert and able to give some history.  She tells me that she does not remember coming into the hospital. Only gradually does she put together the story that she had been passed out on her floor for a few days.  She cannot tell me why she was passed out.  I proposed to her that perhaps she had been overusing some of her medication and she denied it.  She tells me that in fact she was trying to  limit the amount of Xanax that she took because she was afraid she was going to run out of it. She eventually admits that she has also been taking some pain medication recently.  She tells me that last week she was feeling very sad because her nephew had died but she denied having had any suicidal ideation.  She said that she has positive things to look forward to now because she is getting back together with her husband.  The patient denies being aware of any auditory or visual hallucinations.  Denies suicidal or homicidal ideation.   PAST PSYCHIATRIC  HISTORY:  The patient tells me that she has been diagnosed with "bipolar disorder, ADHD, PTSD, with a capital D."  She says that she, however, has never been in a psychiatric hospital.  Denies that she has ever had psychotic symptoms.  Says that she tried to kill herself when she was in her 14s but never again since then.  She currently sees Dr. Kasandra Knudsen for outpatient treatment and is on a rather enormous amount of 2 mg of Xanax 4 times a day.  She says she takes Seroquel 800 mg at night which is different than what we have in the chart here.   SOCIAL HISTORY:  Says that she lives alone with her dog.  Has an in-home home health provider five days a week.  Says that she is trying to get back together with her husband.   SUBSTANCE ABUSE HISTORY:  Denies alcohol or drug abuse.  Denies that she has ever had any problems with alcohol or drug abuse.   PAST MEDICAL HISTORY:  She says she is not on any medications for anything except her psychiatric illness and claims that she has no significant ongoing medical problems.  Just a couple of months ago she was in the hospital for an episode of rhabdomyolysis that seemed to be unclear in etiology as well.   FAMILY HISTORY:  Denies any family history of mental illness.   CURRENT  MEDICATIONS:  Alprazolam 2 mg 4 times a day, Seroquel by her account 800 mg at night, Lexapro 10-20 mg a day, most recently had been taking some narcotics that she cannot remember.   ALLERGIES:  ASPIRIN, CODEINE, AND IBUPROFEN.   REVIEW OF SYSTEMS:  Currently denies any pain, denies GI complaints, denies pulmonary or cardiac complaints.  Denies suicidal ideation, denies hallucinations.   MENTAL STATUS EXAMINATION:  Middle-aged woman, looks her stated age.  She was cooperative and pleasant with the interview.  Eye contact good.  Psychomotor activity normal. Speech was normal in tone, not pressured, a bit rambling at times.  Thoughts are rambling and easily distracted.  Sometimes goes off on a  tangent that really does not make any clear sense at all but then brings herself back to the topic.  She does not during my interview appear to be responding to internal stimuli.  She denies auditory or visual hallucinations, denies suicidal or homicidal ideation.  She is an inconsistent and poor historian.  Her affect is a little odd and she gets easily tearful and then anxious alternately.  She can repeat three words immediately, only remembers one of them at three minutes.  Judgment and insight appear to be poor.   LABORATORY RESULTS:  When she came in to the Emergency Room yesterday, her drug screen was positive for tricyclics, opiates, cannabis, and benzodiazepines.  CK very elevated. TSH normal.  CKs are coming down now.  Creatinine and BUN fortunately are normal.   VITAL SIGNS:  Blood pressure 119/76, respirations 20, pulse 107, temperature 99.7.   ASSESSMENT:  A 54 year old woman who came into the hospital after being passed out for some amount of time.  Every description I am hearing of her sounds like she was delirious earlier today, and in my interview, I think she is still a little bit delirious although much less agitated than before.  I think by far the most likely explanation for this whole series of events is substance related.  The patient is not a very reliable historian.  It could have been that she was overusing Xanax and then ran out early and was having withdrawal.  It could have been that she was intoxicated by the combination of Xanax and opiates and then was passed out for an extended period of time during which she started to suffer from Xanax withdrawal.  I do not think what she is having in a manic episode or an episode of major depression.   TREATMENT PLAN:  I have changed the current Xanax from p.r.n. to q. 6 hours standing. Increased the Seroquel up to 400 to split the difference between what we have in the chart and what she is reporting.  I will follow up with her tomorrow.   Supportive counseling done.  I do not think she requires inpatient hospitalization.  She will be counseled about the extreme dangers of combination of narcotics and benzodiazepines.   DIAGNOSIS, PRINCIPAL AND PRIMARY:  AXIS I:  Delirium related to sedative abuse.   SECONDARY DIAGNOSES:  AXIS I:  Sedative abuse disorder, moderate; opiate use disorder, mild; cannabis use disorder, unknown; history of bipolar disorder by history.     ____________________________ Gonzella Lex, MD jtc:kc D: 06/15/2014 16:55:11 ET T: 06/15/2014 18:11:34 ET JOB#: [id number]  cc: Gonzella Lex, MD, <Dictator> Gonzella Lex MD ELECTRONICALLY SIGNED 07/02/2014 19:28

## 2014-06-27 NOTE — Consult Note (Signed)
Brief Consult Note: Diagnosis: delirium relaterd to substance use.   Patient was seen by consultant.   Consult note dictated.   Orders entered.   Comments: PSychiatry: Full note done. Most likely this whole episode is related to substance use problems. Currently still delirious but improving. Will follow.  Electronic Signatures: Tempest Frankland, Madie Reno (MD)  (Signed 19-Apr-16 16:56)  Authored: Brief Consult Note   Last Updated: 19-Apr-16 16:56 by Gonzella Lex (MD)

## 2014-06-27 NOTE — Consult Note (Signed)
Referring Physician:  Demetrios Loll :   Primary Care Physician:  Nonlocal MD, MD :   Reason for Consult: Admit Date: 18-Apr-2014  Chief Complaint: leg weakness  Reason for Consult: leg weakness   History of Present Illness: History of Present Illness:   seen at request of Dr. Jacqulyn Ducking for leg weakness.  54 yo RHD F presents to [facility] secondary to leg weakness for the past 3-4 days.  Pt states that it was all of a sudden and that she had some cramping in her legs.  Pt denies any numbness but states that she is having pain in both legs that seems to radiate down.  No headache or vision changes.  ROS:  General weakness   HEENT no complaints   Lungs no complaints   Cardiac no complaints   GI no complaints   GU no complaints   Musculoskeletal back pain  muscle ache   Extremities no complaints   Skin no complaints   Neuro no complaints   Endocrine no complaints   Psych no complaints   Past Medical/Surgical Hx:  PTSD:   Chronic Back Pain:   Degenerative Disc Disease:   adhd:   Bipolar:   'whip lash":   arthritis:   Knee Surgery - Left:   Spinal Cord Stimulator:   left hand repair: with pins and rods  left wrist surgery:   Past Medical/ Surgical Hx:  Past Medical History reviewed by me as above   Past Surgical History reviewed by me as above   Home Medications: Medication Instructions Last Modified Date/Time  gabapentin 800 mg oral tablet 1 tab(s) orally 3 times a day 20-Feb-16 14:43  SEROquel XR 200 mg oral tablet, extended release 1 tab(s) orally once a day (in the evening) 20-Feb-16 14:43  Lexapro 20 mg oral tablet 1 tab(s) orally once a day 20-Feb-16 14:43  cyclobenzaprine 10 mg oral tablet 1 tab(s) orally 3 times a day 20-Feb-16 14:43   Allergies:  Codeine: N/V/Diarrhea  Aspirin: GI Distress  Ibuprofen: GI Distress  Allergies:  Allergies codeine, ASA, ibuprofen   Social/Family History: Employment Status: disabled  Lives With: friend  Living  Arrangements: apartment  Social History: + tob, no EtOh, no illicits  Family History: no seizures or stroke   Vital Signs: **Vital Signs.:   21-Feb-16 15:36  Vital Signs Type Q 8hr  Temperature Temperature (F) 98.5  Celsius 36.9  Temperature Source oral  Pulse Pulse 100  Respirations Respirations 18  Systolic BP Systolic BP 073  Diastolic BP (mmHg) Diastolic BP (mmHg) 75  Mean BP 88  Pulse Ox % Pulse Ox % 95  Pulse Ox Activity Level  At rest  Oxygen Delivery Room Air/ 21 %   Physical Exam: General: overweight, NAD, anxious  HEENT: normocephalic, sclera nonicteric, oropharynx clear  Neck: supple, no JVD, no bruits  Chest: CTA B, no wheezing  Cardiac: RRR, no murmurs, no edema, 2+ pulses  Extremities: no C/C/E, FROM   Neurologic Exam: Mental Status: alert and oriented x 3, normal speech and language, follows complex commands  Cranial Nerves: PERRLA, EOMI, nl VF, face symmetric, tongue midline, shoulder shrug equal  Motor Exam: 5/ 5 B UE, 1/5 B LE but poor effort, nl tone, tenderness to palpation in B thighs  Deep Tendon Reflexes: 1+/4 B, plantars downgoing  Sensory Exam: decreased pin and temp in legs only but this is more stocking distrobution  Coordination: F to N WNL, unable to test HTS   Lab Results: Thyroid:  20-Feb-16 18:40  Thyroid Stimulating Hormone 1.56 (0.45-4.50 (IU = International Unit)  ----------------------- Pregnant patients have  different reference  ranges for TSH:  - - - - - - - - - -  Pregnant, first trimetser:  0.36 - 2.50 uIU/mL)  Routine Micro:  20-Feb-16 14:56   Micro Text Report BLOOD CULTURE   COMMENT                   NO GROWTH IN 18-24 HOURS   ANTIBIOTIC                       Culture Comment NO GROWTH IN 18-24 HOURS  Result(s) reported on 18 Apr 2014 at 03:00PM.  Specimen Source INDWELLING CATHETER  Routine Chem:  20-Feb-16 14:09   Lipase  38 (Result(s) reported on 17 Apr 2014 at 05:08PM.)  Magnesium, Serum 2.2  (1.8-2.4 THERAPEUTIC RANGE: 4-7 mg/dL TOXIC: > 10 mg/dL  -----------------------)    14:56   Ammonia, Plasma 20  21-Feb-16 04:27   Result Comment SODIUM/CK - RESULTS VERIFIED BY REPEAT TESTING.  Result(s) reported on 18 Apr 2014 at 06:17AM.  Phosphorus, Serum 2.5 (Result(s) reported on 18 Apr 2014 at 06:17AM.)  Glucose, Serum 96  BUN 8  Creatinine (comp) 0.91  Sodium, Serum 143  Potassium, Serum 3.7  Chloride, Serum  109  CO2, Serum 29  Calcium (Total), Serum  7.5  Anion Gap  5  Osmolality (calc) 283  eGFR (African American) >60  eGFR (Non-African American) >60 (eGFR values <36m/min/1.73 m2 may be an indication of chronic kidney disease (CKD). Calculated eGFR, using the MRDR Study equation, is useful in  patients with stable renal function. The eGFR calculation will not be reliable in acutely ill patients when serum creatinine is changing rapidly. It is not useful in patients on dialysis. The eGFR calculation may not be applicable to patients at the low and high extremes of body sizes, pregnant women, and vegetarians.)  Hemoglobin A1c ([facility]) 6.0 (The American Diabetes Association recommends that a primary goal of therapy should be <7% and that physicians should reevaluate the treatment regimen in patients with HbA1c values consistently >8%.)  Urine Drugs:  [id number]  Tricyclic Antidepressant, Ur Qual (comp) POSITIVE (Result(s) reported on 17 Apr 2014 at 03:47PM.)  Amphetamines, Urine Qual. NEGATIVE  MDMA, Urine Qual. NEGATIVE  Cocaine Metabolite, Urine Qual. NEGATIVE  Opiate, Urine qual POSITIVE  Phencyclidine, Urine Qual. NEGATIVE  Cannabinoid, Urine Qual. POSITIVE  Barbiturates, Urine Qual. NEGATIVE  Benzodiazepine, Urine Qual. POSITIVE (----------------- The URINE DRUG SCREEN provides only a preliminary, unconfirmed analytical test result and should not be used for non-medical  purposes.  Clinical consideration and professional judgment should be  applied to  any positive drug screen result due to possible interfering substances.  A more specific alternate chemical method must be used in order to obtain a confirmed analytical result.  Gas chromatography/mass spectrometry (GC/MS) is the preferred confirmatory method.)  Methadone, Urine Qual. NEGATIVE  Cardiac:  20-Feb-16 18:40   Troponin I < 0.02 (0.00-0.05 0.05 ng/mL or less: NEGATIVE  Repeat testing in 3-6 hrs  if clinically indicated. >0.05 ng/mL: POTENTIAL  MYOCARDIAL INJURY. Repeat  testing in 3-6 hrs if  clinically indicated. NOTE: An increase or decrease  of 30% or more on serial  testing suggests a  clinically important change)  21-Feb-16 04:27   CK, Total  10328  CPK-MB, Serum  5.9 (Result(s) reported on 18 Apr 2014 at 06:17AM.)  Routine UA:  20-Feb-16 14:26  Mucous (UA) PRESENT  Amorphous Crystal (UA) PRESENT (Result(s) reported on 17 Apr 2014 at 02:53PM.)    14:56   Color (UA) Advice worker (UA) Clear  Glucose (UA) Negative  Bilirubin (UA) Negative  Ketones (UA) Negative  Specific Gravity (UA) 1.025  Blood (UA) 3+  pH (UA) 6.0  Protein (UA) 100 mg/dL  Nitrite (UA) Negative  Leukocyte Esterase (UA) Negative (Result(s) reported on 17 Apr 2014 at 03:35PM.)  RBC (UA) NONE SEEN  WBC (UA) 3 /HPF  Bacteria (UA) NONE SEEN  Epithelial Cells (UA) <1 /HPF (Result(s) reported on 17 Apr 2014 at 03:35PM.)  Routine Coag:  20-Feb-16 14:09   Prothrombin 12.5 (11.4-15.0 NOTE: New Reference Range  03/26/14)  INR 0.9 (INR reference interval applies to patients on anticoagulant therapy. A single INR therapeutic range for coumarins is not optimal for all indications; however, the suggested range for most indications is 2.0 - 3.0. Exceptions to the INR Reference Range may include: Prosthetic heart valves, acute myocardial infarction, prevention of myocardial infarction, and combinations of aspirin and anticoagulant. The need for a higher or lower target INR must be assessed  individually. Reference: The Pharmacology and Management of the Vitamin K  antagonists: the seventh ACCP Conference on Antithrombotic and Thrombolytic Therapy. TRRNH.6579 Sept:126 (3suppl): [id number]. A HCT value >55% may artifactually increase the PT.  In one study,  the increase was an average of 25%. Reference:  "Effect on Routine and Special Coagulation Testing Values of Citrate Anticoagulant Adjustment in Patients with High HCT Values." American Journal of Clinical Pathology 2006;126:400-405.)  Routine Hem:  21-Feb-16 04:27   WBC (CBC) 9.7  RBC (CBC)  3.51  Hemoglobin (CBC)  11.1  Hematocrit (CBC)  33.1  Platelet Count (CBC) 169  MCV 95  MCH 31.7  MCHC 33.6  RDW 14.5  Neutrophil % 64.3  Lymphocyte % 22.3  Monocyte % 7.7  Eosinophil % 5.2  Basophil % 0.5  Neutrophil # 6.3  Lymphocyte # 2.2  Monocyte # 0.7  Eosinophil # 0.5  Basophil # 0.1 (Result(s) reported on 18 Apr 2014 at 04:48AM.)   Radiology Results: CT:    20-Feb-16 17:30, CT Head Without Contrast  CT Head Without Contrast   REASON FOR EXAM:    confusion  COMMENTS:       PROCEDURE: CT  - CT HEAD WITHOUT CONTRAST  - Apr 17 2014  5:30PM     CLINICAL DATA:  Acute onset of generalized weakness, swelling and  severe BILATERAL lower extremity pain, confusion, nausea, vomiting,  leukocytosis, dark urine    EXAM:  CT HEAD WITHOUT CONTRAST    TECHNIQUE:  Contiguous axial images were obtained from the base of the skull  through the vertex without intravenous contrast.  COMPARISON:  None    FINDINGS:  Mild generalized atrophy.    Normal ventricular morphology.    No midline shift or mass effect.    Otherwise normal appearance of brain parenchyma.    No intracranial hemorrhage, mass lesion, or acute infarction.    Visualized paranasal sinuses clear.  Bones unremarkable.     IMPRESSION:  Generalized atrophy.    No acute intracranial abnormalities.      Electronically Signed    By: Lavonia Dana  M.D.    On: 04/17/2014 17:50         Verified By: Burnetta Sabin, M.D.,   Radiology Impression: Radiology Impression: MRI of cervical and thoractic spine reviewed by me and shows no significant bulges or cord changes  Impression/Recommendations: Recommendations:   prior notes reviewed by me reviewed by me   Probable myositis-  pt has elevated CPK and tenderness but needs additional w/u.  No sign of cord infarct to me but await radiology official read of MRI.  Weakness is more than expected though for myositis and not sure if this is embellishment vs. some other process as well.  No sensory level which goes against cord problem. UTI-  this is likely cause of incontinence Mild peripheral neuropathy-  unsure if this is why pt is on Neurotin start Prednisone 68m daily check B12/folate, ESR, CRP, liver function, myoglobin and aldolase continue high fluids  needs PT consult will follow  Electronic Signatures: SJamison Neighbor(MD)  (Signed 21-Feb-16 19:47)  Authored: REFERRING PHYSICIAN, Primary Care Physician, Consult, History of Present Illness, Review of Systems, PAST MEDICAL/SURGICAL HISTORY, HOME MEDICATIONS, ALLERGIES, Social/Family History, NURSING VITAL SIGNS, Physical Exam-, LAB RESULTS, RADIOLOGY RESULTS, Recommendations   Last Updated: 21-Feb-16 19:47 by SJamison Neighbor(MD)

## 2014-06-27 NOTE — Consult Note (Signed)
Psychiatry: Follow up patient with delirium. This evening she is awake and alert and appears much more lucid. She is oriented an ddenies any hallucinations or mood problems. Affect is euthymic and she is attemtive and denies any feeling of confusion. described to the patient my speculation that she had accidentally overdosed on sedatives and narcotics and then had withdrawl. The risks of use of high dose sedatives and narcotics were reviewed. PAtient was attentive and appropriate about the conversation. No sign of acute dangerousness and no need for inpatient psych treatment. No change to medication. Will continue to follow.  Electronic Signatures: Clapacs, Madie Reno (MD)  (Signed on 20-Apr-16 23:23)  Authored  Last Updated: 20-Apr-16 23:23 by Gonzella Lex (MD)

## 2014-06-27 NOTE — Discharge Summary (Signed)
PATIENT NAME:  Nicole Austin, RATHJE MR#:  [id number] DATE OF BIRTH:  Jun 13, 1960  DATE OF ADMISSION:  04/17/2014 DATE OF DISCHARGE:    DISCHARGE DIAGNOSES:  1.  Rhabdomyolysis.  2.  Myositis.  3.  Urinary tract infection.  4.  Dehydration.  5.  Hyponatremia.   CONDITION ON DISCHARGE: Stable.   CODE STATUS ON DISCHARGE: Full code.  DISCHARGE MEDICATIONS:   1.  Gabapentin 800 mg 3 times a day.  2.  Seroquel slow release 200 mg once a day.  3.  Lexapro 20 mg oral once a day.  4.  Cyclobenzaprine 10 mg oral tablet 3 times a day.  5.  Prednisone 10 mg oral tablets, start at 60 and taper by 10 daily until complete.  6.  Acetaminophen and oxycodone 325/5 mg 1-4 times a day as needed for moderate pain.  7.  Alprazolam 0.5 mg oral tablet 3 times a day as needed for anxiety and nervousness.  8.  Nicotine patch transdermal once a day.   HISTORY OF PRESENTING ILLNESS: A 54 year old Caucasian female with history of chronic obstructive pulmonary disease, arthritis, presented to the Emergency Department with chief complaint of muscle aching and generalized weakness for the last 3 days.  She had complained of a lot of stress in her life for the last few days and then gradually started having weakness. She also had some hematuria and some dysuria. She complained of dark urine but denied any chills or fever.  In the ER, her CK was noted to be very high and so started on IV fluid and admitted for possible rhabdomyolysis.   HOSPITAL COURSE AND STAY: After being admitted for rhabdomyolysis she was continued on IV fluid which helped improve her CK level.  Her renal function remained stable while she was in the hospital and gradually CK level improved.   Bilateral lower extremity weakness. The patient had bilateral leg weakness which also started at same time, 3-4 days ago. She had excruciating pain and some numbness and weakness in both legs so we called neurology consult. He suggested an MRI of the cervical and  thoracic spine to rule out any spinal cord stroke, which was negative.  He also ordered an MRI of the lumbar spine to rule out any sciatica, which also came out to be negative. ESR level was high, so was her myoglobin and aldolase levels so she was started on steroid suspecting myositis and the patient had good response to that. Left-sided weakness completely resolved and right-sided weakness is improving. She still has some pain and numbness in between, so we arranged for physical therapy and rehab placement for her. She needs to continue oral tapering steroid and pain medication for this for at least 1-2 weeks until she recovers.    Urinary tract infection. She was started on Rocephin and cultures were negative so stopped after 3-4 days of therapy.   Dehydration. Continued IV fluid and resolved.  Hyponatremia with normal saline it resolved.    Hypophosphatemia. This was present on admission and replaced and resolved.   Chronic obstructive pulmonary disease. This was stable. The patient did not have any wheezing.  IMPORTANT CONSULTS IN THE HOSPITAL: Neurology, Dr.  Irish Elders.  LABORATORY RESULTS:  WBC 14.4, hemoglobin 13.8, platelet count 197,000, MCV 92. Glucose 106, BUN 12, creatinine 1.17, sodium 135, potassium 3.8, chloride 98, and CO2 was 31, CK level on presentation was 20,760. Urinalysis was positive with 26 WBC and 3+ leukocyte esterase. Blood cultures were negative on admission. Urine  culture was negative. Urine for toxicology was positive for tricyclic, antidepressant, opiate, and benzodiazepine. CT head without contrast was done and showed no acute intracranial abnormality. TSH was 1.56. MRI of the cervical spine, thoracic spine, and lumbar spine are without any acute findings.  ESR is 67, myoglobin level in serum is 581, and the normal is 25-58.  Aldolase level in serum is 19.9 and normal is 3.3-10.1.   TOTAL TIME SPENT ON THIS DISCHARGE: 40 minutes.     ____________________________ Ceasar Lund Anselm Jungling, MD vgv:mc D: 04/22/2014 14:39:43 ET T: 04/22/2014 14:51:53 ET JOB#: [id number]  cc: Ceasar Lund. Anselm Jungling, MD, <Dictator> Vaughan Basta MD ELECTRONICALLY SIGNED 05/10/2014 12:56

## 2014-06-27 NOTE — H&P (Signed)
PATIENT NAME:  Nicole Austin, Nicole Austin MR#:  [id number] DATE OF BIRTH:  1960-11-15  DATE OF ADMISSION:  04/17/2014  PRIMARY CARE PHYSICIAN: Nonlocal.  REFERRING PHYSICIAN: Rebecca L. Lord, MD   CHIEF COMPLAINT: Muscle aching and generalized weakness for the past 3 days.    HISTORY OF PRESENT ILLNESS: A 54 year old Caucasian female with a history of COPD, arthritis, presented to the ED with above chief complaint. The patient is alert, awake, oriented, in no acute distress. The patient complains of generalized weakness and body aching for the past 3 days. She said she recently walked a lot to take care of her 3 grandchildren. She feels tired, feels headache, dizziness, and generalized weakness. The patient also has urinary frequency, but denies any hematuria or dysuria. The patient complains of dark urine, but she denies any fever or chills. Denies any chest pain or palpitation. The patient had pneumonia and bronchitis 2 months ago and still has some mild cough occasionally. The patient was noted to have a high CK and was treated with IV fluid support in the ED.   PAST MEDICAL HISTORY: COPD, arthritis, bipolar disorder, PTSD, degenerative disk disease.   PAST SURGICAL HISTORY: Left knee replacement, back surgery.   SOCIAL HISTORY: Smokes 1 pack a day for almost 40 years. Denies any alcohol drinking or illicit drugs.   FAMILY HISTORY: Mother had a heart attack and a stroke.   ALLERGIES: ASPIRIN, CODEINE, IBUPROFEN.   HOME MEDICATIONS: Seroquel XR 200 mg p.o. in the evening, Lexapro 20 mg p.o. daily, gabapentin 800 mg t.i.d., and cyclobenzaprine 10 mg p.o. t.i.d.  REVIEW OF SYSTEMS: CONSTITUTIONAL: The patient denies any fever or chills, but has headache, dizziness,  generalized weakness, and poor oral intake.  EYES: No double vision or blurred vision.  ENT: No postnasal drip, slurred speech, or dysphagia.  CARDIOVASCULAR: No chest pain, palpitation, orthopnea, or nocturnal dyspnea. No leg edema.   PULMONARY: Mild cough. No shortness of breath, wheezing, or hemoptysis.   GASTROINTESTINAL: No abdominal pain, nausea, vomiting, or diarrhea. No melena or bloody stool.  GENITOURINARY: No dysuria, hematuria, or incontinence, but has dark urine and urinary frequency.  HEMATOLOGY: No easy bleeding or bleeding.  ENDOCRINE: No polyuria, polydipsia, heat or cold intolerance.  NEUROLOGY: No syncope, loss of consciousness, or seizure.  MUSCULOSKELETAL: Generalized body aching, especially bilateral lower extremities. No leg edema.   PHYSICAL EXAMINATION:  VITAL SIGNS: Temperature 99.6, blood pressure 105/60, pulse 103, O2 saturation 95% in room air.  GENERAL: The patient is alert, awake, oriented, in no acute distress.  HEENT: Pupils round, equal, reactive to light and accommodation. Very dry oral mucosa. Clear oropharynx.  NECK: Supple. No JVD or carotid bruit. No lymphadenopathy. No pain. No thyromegaly.  CARDIOVASCULAR: S1, S2, regular rate and rhythm. No murmurs or gallops.  PULMONARY: Bilateral air entry. No wheezing or rales. No use of accessory muscle to breathe.  ABDOMEN: Soft. No distention, obese. Bowel sounds are present. Mild tenderness in the lower part of the abdomen. No rigidity. No rebound. No organomegaly.  EXTREMITIES: No edema, clubbing, or cyanosis, but has generalized tenderness, especially under the knee. In addition, the patient has a blister on the right lower extremity above the knee. No erythema or swelling.  NEUROLOGIC: A and O x 3.  No focal deficit. Power 3/5. Sensation intact, but has increased pin sensation.   LABORATORY DATA: Urinalysis showed WBCs 26, RBCs 29, nitrite negative. CBC showed  WBC 14.4, hemoglobin 13.8, platelets 197,000. Glucose 106, BUN 12, creatinine 1.17, sodium  135, potassium 3.8, chloride 98, bicarbonate 31. Troponin less than 0.02. Magnesium 2.2, phosphorous 1.5.  INR 0.9. CK 20,760. Lipase is 38. Chest x-ray: No active disease. Lactic acid 1.6. CAT  scan of head: No acute intracranial abnormality. TSH 1.56.    EKG showed sinus tachycardia at 109 bpm.   IMPRESSIONS:  1.  Acute hypophosphatemic or excertional rhabdomyolysis.  2.  Urinary tract infection with leukocytosis.  3.  Dehydration.  4.  Tobacco abuse.  5.  Hyponatremia.  6.  History of post-traumatic stress disorder.  7.  Chronic back pain.  8.  Chronic obstructive pulmonary disease, stable.   PLAN OF TREATMENT:  1.  The patient will be admitted to medical floor. The patient was treated with normal saline bolus. We will continue normal saline IV 150 mL per hour. Follow up her CK and BMP. We will give phosphorus and follow up level. 2.  For UTI, we started Rocephin. Check urine culture.  3.  For tobacco abuse, we will give a nicotine patch. Smoking cessation was counseled for 4 minutes.  4.  The patient is taking Seroquel which has  rare side effect of rhabdomyolysis. I will hold  Seroquel and continue other home medications.  5.  I discussed the patient's condition and plan of treatment with the patient.  TIME SPENT: About 52 minutes.    ____________________________ Demetrios Loll, MD qc:at D: 04/17/2014 20:50:00 ET T: 04/17/2014 21:02:16 ET JOB#: [id number]  cc: Demetrios Loll, MD, <Dictator> Demetrios Loll MD ELECTRONICALLY SIGNED 04/17/2014 23:00

## 2014-06-27 NOTE — Discharge Summary (Signed)
PATIENT NAME:  Nicole Austin, Nicole Austin MR#:  [id number] DATE OF BIRTH:  11-05-1960  DATE OF ADMISSION:  06/14/2014 DATE OF DISCHARGE:  06/17/2014  PRIMARY CARE PHYSICIAN: None local.   DISCHARGE DIAGNOSES:  1.  Rhabdomyolysis.  2.  Altered mental status with loss of consciousness, suspect due to dehydration and substance abuse.  3.  Chronic obstructive pulmonary disease. 4.  Bipolar disorder with hallucination.   CONDITION:  Stable.   CODE STATUS: FULL CODE.     HOME MEDICATIONS:  Please refer to the medication reconciliation list.  DISPOSITION:  The patient needs home health and physical therapy with R.N.    DIET:  Low-sodium, low-fat, low-cholesterol diet.   ACTIVITY:  As tolerated.   FOLLOWUP CARE:  Followup with PCP and psychiatry clinic within 1 to 2 weeks.  The patient also needs EPS service.   REASON FOR ADMISSION: Fall on the floor with loss of consciousness for 4 days.   HOSPITAL COURSE:   23.  A 54 year old Caucasian female with a history of COPD , rhabdomyolysis and arthritis was sent to ED due to fall and loss of consciousness for 4 days.  For detailed history and physical examination, please refer to the admission note dictated by me.  The patient has elevated CK with diagnosis of rhabdomyolysis.  The patient has been treated with IV fluid support.  CK decreased.  Rhabdomyolysis has improved.  2.  Altered mental status with loss of consciousness, unclear etiology.  This is possibly due to dehydration and substance abuse.  The patient's mental status is improved.  Dr. Weber Cooks did suggests the patient continue Celexa and Seroquel.  The patient needs APS service.  In addition, the patient needs home health and physical therapy.  3.  Lactic acidosis improved.  4.  Chronic obstructive pulmonary disease, stable.    DISPOSITION:  The patient has no complaints.  Vital signs are stable. Her physical examination is unremarkable.  She is clinically stable and will be discharged to home with  home health and APS service.  Discussed the patient's discharge plan with the patient, nurse, case Freight forwarder and Education officer, museum.   TIME SPENT:  About 38 minutes.    ___________________________ Demetrios Loll, MD [phone] D: 06/17/2014 19:08:05 ET T: 06/17/2014 20:55:25 ET JOB#: [id number]  cc: Demetrios Loll, MD, <Dictator> Demetrios Loll MD ELECTRONICALLY SIGNED 06/18/2014 15:24

## 2014-06-27 NOTE — H&P (Signed)
PATIENT NAME:  Nicole Austin, INABINET MR#:  [id number] DATE OF BIRTH:  January 11, 1961  DATE OF ADMISSION:  06/14/2014  PRIMARY CARE PHYSICIAN:  Nonlocal.   REFERRING PHYSICIAN:  Debbrah Alar, MD   CHIEF COMPLAINT:  Fall on the floor with loss of consciousness for 4 days.   HISTORY OF PRESENT ILLNESS:  A 54 year old Caucasian female with a history of COPD, arthritis, and rhabdomyolysis, who was sent from home to the ED due to the above chief complaint. The patient is alert, awake, and oriented. According to the patient and Dr. Archie Balboa, the patient was found by health caregiver today. The patient was found on the floor covered with feces and urine all over the body. Today, the patient was wakened up by health caregiver and then sent to the ED for further evaluation. The patient was confused, but now the patient is alert and awake. She said she fell on the floor at home on Friday and has been on the floor for 4 days. She cannot remember what happened to her, but it looks like she lost consciousness for the past 4 days. She feels cold, but she denies any fever or chills. She denies any nausea, vomiting, or diarrhea. The patient said that she has incontinence of urine. The patient had rhabdomyolysis 2 months ago.   PAST MEDICAL HISTORY:  COPD, arthritis, bipolar disorder, PTSD, degenerative disk disease, rhabdomyolysis, myositis.   PAST SURGICAL HISTORY:  Left knee replacement and back surgery.   SOCIAL HISTORY:  Smokes 1 pack a day for 40 years. Denies any alcohol drinking or illicit drugs.   FAMILY HISTORY:  Mother had a heart attack and a stroke.   ALLERGIES:  ASPIRIN, CODEINE, AND IBUPROFEN.   HOME MEDICATIONS:  Seroquel XR 200 mg p.o. in the evening, Lyrica 100 mg p.o. t.i.d., escitalopram 20 mg p.o. daily, cyclobenzaprine 10 mg p.o. every 8 hours p.r.n. for muscle spasm, alprazolam 2 mg p.o. 4 times a day p.r.n. for anxiety.   REVIEW OF SYSTEMS: CONSTITUTIONAL:  The patient denies any fever or  chills. No headache or dizziness, but has weakness.  EYES:  No double vision or blurred vision.  EARS, NOSE, AND THROAT:  No postnasal drip, slurred speech, or dysphagia.  CARDIOVASCULAR:  No chest pain, palpitation, orthopnea, or nocturnal dyspnea. No leg edema.  PULMONARY:  No cough, sputum, shortness of breath, or hemoptysis.  GASTROINTESTINAL:  No abdominal pain, nausea, vomiting, or diarrhea. No melena or bloody stool.  GENITOURINARY:  No dysuria, but has hematuria and incontinence.  SKIN:  No rash or jaundice.  NEUROLOGIC:  Questionable syncope, loss of consciousness, but no seizure.  ENDOCRINE:  No polyuria, polydipsia, or heat or cold intolerance.  HEMATOLOGIC:  No easy bruising or bleeding.  PSYCHIATRIC:  No depression. No anxiety.   PHYSICAL EXAMINATION: VITAL SIGNS:  Temperature 98.4, blood pressure 145/81, pulse 82, and O2 saturation 99% on room air.  GENERAL:  The patient is alert, awake, oriented, and in no acute distress, obese.  HEENT:  Pupils are round, equal, and reactive to light and accommodation. Dry oral mucosa. Clear oropharynx.  NECK:  Supple. No JVD or carotid bruit. No lymphadenopathy. No thyromegaly.  CARDIOVASCULAR:  S1 and S2, regular rate and rhythm. No murmurs or gallops.  PULMONARY:  Bilateral air entry. No wheezing or rales. No use of accessory muscles to breathe.  ABDOMEN:  Soft. No distention or tenderness. No organomegaly. Bowel sounds present.  EXTREMITIES:  No edema, clubbing, or cyanosis. No calf tenderness. Bilateral pedal  pulses present.  SKIN:  No rash or jaundice.  NEUROLOGIC:  AO x 3. No focal deficit. Power 5/5. Sensation is intact.   LABORATORY DATA:  Glucose is 83, BUN 14, creatinine 0.78. Electrolytes are normal. Lactic acid is 2.9. CK is 2041, CK-MB 8.5. Troponin is less than 0.03. CBC is in the normal range. Urinalysis shows RBC TNTC, WBC no seen, urine blood negative, nitrites negative, ketones 1+. ABG showed pH of 7.57, pCO2 of 27, and pO2  of 111.   CAT scan of the head:  No evidence of acute intracranial abnormality. Right hip x-ray:  Normal right hip. Right humerus x-ray shows normal right humerus.   IMPRESSION: 1.  Rhabdomyolysis.  2.  Altered mental status with loss of consciousness, unclear etiology.  3.  Hematuria.  4.  Lactic acidosis.  5.  Chronic obstructive pulmonary disease, stable.  6.  Bipolar disorder.   PLAN OF TREATMENT:   1.  The patient will be admitted to medical floor for rhabdomyolysis, and we will start normal saline IV and follow up CK.  2.  For altered mental status with loss of consciousness, CAT scan is negative, unclear etiology, possibly due to medication. I will get urine toxicology. 3.  For hematuria, we will repeat a urinalysis and get an ultrasound of the kidneys and bladder.  4.  Chronic obstructive pulmonary disease, stable.  5.  For tobacco abuse, the patient was counseled for smoking cessation for 3 to 4 minutes. We will give a nicotine patch.  6.  I discussed the patient's condition and plan of treatment with the patient. The patient wants full code.   TIME SPENT:  About 53 minutes.    ____________________________ Demetrios Loll, MD qc:nb D: 06/14/2014 21:44:50 ET T: 06/14/2014 22:02:24 ET JOB#: [id number]  cc: Demetrios Loll, MD, <Dictator> Demetrios Loll MD ELECTRONICALLY SIGNED 06/16/2014 17:37

## 2014-06-28 ENCOUNTER — Other Ambulatory Visit: Payer: Self-pay

## 2014-06-28 DIAGNOSIS — M79609 Pain in unspecified limb: Secondary | ICD-10-CM | POA: Diagnosis not present

## 2014-06-28 DIAGNOSIS — F314 Bipolar disorder, current episode depressed, severe, without psychotic features: Secondary | ICD-10-CM | POA: Diagnosis not present

## 2014-06-28 DIAGNOSIS — M546 Pain in thoracic spine: Secondary | ICD-10-CM | POA: Diagnosis not present

## 2014-06-28 DIAGNOSIS — R202 Paresthesia of skin: Secondary | ICD-10-CM | POA: Diagnosis not present

## 2014-06-28 DIAGNOSIS — J441 Chronic obstructive pulmonary disease with (acute) exacerbation: Secondary | ICD-10-CM

## 2014-06-28 DIAGNOSIS — F3189 Other bipolar disorder: Secondary | ICD-10-CM | POA: Diagnosis not present

## 2014-06-28 DIAGNOSIS — G894 Chronic pain syndrome: Secondary | ICD-10-CM

## 2014-06-28 DIAGNOSIS — Z8739 Personal history of other diseases of the musculoskeletal system and connective tissue: Secondary | ICD-10-CM | POA: Diagnosis not present

## 2014-06-28 NOTE — Patient Outreach (Signed)
[place] [facility]) Care Management  06/28/2014  Nicole Austin 1961/02/26 [id number]   RN CM spoke with [facility] nurse, Steffanie Dunn Reel [phone] cell/ main office [phone]).  She is presently making home visits with this patient.  She states the last visit is August 13, 2014.  She is educating patient on how to self-manage her COPD.  She feels patient will need the support of a [facility] community nurse to continue learning how to self-manage her COPD.  She reports that the Physical Therapist has 3 more visits to make.  She reports patient has transportation issues to get to her doctor appointments.  RN CM has made a referral for social work to address transportation issues.   RN CM will make a referral for community nurse for case management.  Maury Dus, RN, Ishmael Holter, [place] Telephonic Care Coordinator [phone]

## 2014-06-29 ENCOUNTER — Other Ambulatory Visit: Payer: Self-pay | Admitting: *Deleted

## 2014-06-29 DIAGNOSIS — F3189 Other bipolar disorder: Secondary | ICD-10-CM | POA: Diagnosis not present

## 2014-06-29 NOTE — Patient Outreach (Signed)
[place] [facility]) Care Management  06/29/2014  Danaysia Rader Starliper [birth year] [phone]  Phone call to patient to schedule home visit to assess patient for available community resources. HIPPA compliant voice mail message left for a return phone call.  [name] [facility] Care Management [phone]

## 2014-06-30 DIAGNOSIS — F3189 Other bipolar disorder: Secondary | ICD-10-CM | POA: Diagnosis not present

## 2014-06-30 DIAGNOSIS — M1990 Unspecified osteoarthritis, unspecified site: Secondary | ICD-10-CM | POA: Diagnosis not present

## 2014-06-30 DIAGNOSIS — F4310 Post-traumatic stress disorder, unspecified: Secondary | ICD-10-CM | POA: Diagnosis not present

## 2014-06-30 DIAGNOSIS — F17210 Nicotine dependence, cigarettes, uncomplicated: Secondary | ICD-10-CM | POA: Diagnosis not present

## 2014-06-30 DIAGNOSIS — M6282 Rhabdomyolysis: Secondary | ICD-10-CM | POA: Diagnosis not present

## 2014-06-30 DIAGNOSIS — F319 Bipolar disorder, unspecified: Secondary | ICD-10-CM | POA: Diagnosis not present

## 2014-06-30 DIAGNOSIS — J449 Chronic obstructive pulmonary disease, unspecified: Secondary | ICD-10-CM | POA: Diagnosis not present

## 2014-07-01 DIAGNOSIS — F3189 Other bipolar disorder: Secondary | ICD-10-CM | POA: Diagnosis not present

## 2014-07-02 ENCOUNTER — Inpatient Hospital Stay: Payer: Commercial Managed Care - HMO

## 2014-07-02 ENCOUNTER — Emergency Department: Payer: Commercial Managed Care - HMO

## 2014-07-02 ENCOUNTER — Inpatient Hospital Stay
Admission: EM | Admit: 2014-07-02 | Discharge: 2014-07-03 | DRG: 555 | Disposition: A | Discharge status: Home Health | Payer: Commercial Managed Care - HMO | Attending: Internal Medicine | Admitting: Internal Medicine

## 2014-07-02 ENCOUNTER — Encounter: Payer: Self-pay | Admitting: General Practice

## 2014-07-02 DIAGNOSIS — R400 Somnolence: Secondary | ICD-10-CM | POA: Diagnosis present

## 2014-07-02 DIAGNOSIS — Z720 Tobacco use: Secondary | ICD-10-CM | POA: Diagnosis not present

## 2014-07-02 DIAGNOSIS — F4310 Post-traumatic stress disorder, unspecified: Secondary | ICD-10-CM | POA: Diagnosis present

## 2014-07-02 DIAGNOSIS — R4781 Slurred speech: Secondary | ICD-10-CM | POA: Diagnosis present

## 2014-07-02 DIAGNOSIS — G9340 Encephalopathy, unspecified: Secondary | ICD-10-CM | POA: Diagnosis not present

## 2014-07-02 DIAGNOSIS — J449 Chronic obstructive pulmonary disease, unspecified: Secondary | ICD-10-CM | POA: Diagnosis not present

## 2014-07-02 DIAGNOSIS — M5032 Other cervical disc degeneration, mid-cervical region: Secondary | ICD-10-CM | POA: Diagnosis not present

## 2014-07-02 DIAGNOSIS — F319 Bipolar disorder, unspecified: Secondary | ICD-10-CM | POA: Diagnosis present

## 2014-07-02 DIAGNOSIS — F3189 Other bipolar disorder: Secondary | ICD-10-CM | POA: Diagnosis not present

## 2014-07-02 DIAGNOSIS — I639 Cerebral infarction, unspecified: Secondary | ICD-10-CM | POA: Diagnosis present

## 2014-07-02 DIAGNOSIS — Z79899 Other long term (current) drug therapy: Secondary | ICD-10-CM | POA: Diagnosis not present

## 2014-07-02 DIAGNOSIS — M6282 Rhabdomyolysis: Secondary | ICD-10-CM | POA: Diagnosis not present

## 2014-07-02 DIAGNOSIS — Z87891 Personal history of nicotine dependence: Secondary | ICD-10-CM | POA: Diagnosis not present

## 2014-07-02 DIAGNOSIS — R29898 Other symptoms and signs involving the musculoskeletal system: Secondary | ICD-10-CM

## 2014-07-02 DIAGNOSIS — Z96652 Presence of left artificial knee joint: Secondary | ICD-10-CM | POA: Diagnosis present

## 2014-07-02 DIAGNOSIS — M6281 Muscle weakness (generalized): Secondary | ICD-10-CM | POA: Diagnosis not present

## 2014-07-02 DIAGNOSIS — R531 Weakness: Secondary | ICD-10-CM

## 2014-07-02 DIAGNOSIS — F17210 Nicotine dependence, cigarettes, uncomplicated: Secondary | ICD-10-CM | POA: Diagnosis present

## 2014-07-02 DIAGNOSIS — M2578 Osteophyte, vertebrae: Secondary | ICD-10-CM | POA: Diagnosis not present

## 2014-07-02 DIAGNOSIS — H539 Unspecified visual disturbance: Secondary | ICD-10-CM | POA: Diagnosis not present

## 2014-07-02 DIAGNOSIS — G8190 Hemiplegia, unspecified affecting unspecified side: Secondary | ICD-10-CM | POA: Diagnosis not present

## 2014-07-02 DIAGNOSIS — M1990 Unspecified osteoarthritis, unspecified site: Secondary | ICD-10-CM | POA: Diagnosis not present

## 2014-07-02 DIAGNOSIS — G458 Other transient cerebral ischemic attacks and related syndromes: Secondary | ICD-10-CM | POA: Diagnosis not present

## 2014-07-02 DIAGNOSIS — F1910 Other psychoactive substance abuse, uncomplicated: Secondary | ICD-10-CM | POA: Diagnosis present

## 2014-07-02 HISTORY — DX: Chronic obstructive pulmonary disease, unspecified: J44.9

## 2014-07-02 HISTORY — DX: Rhabdomyolysis: M62.82

## 2014-07-02 HISTORY — DX: Unspecified osteoarthritis, unspecified site: M19.90

## 2014-07-02 HISTORY — DX: Bipolar disorder, unspecified: F31.9

## 2014-07-02 HISTORY — DX: Post-traumatic stress disorder, unspecified: F43.10

## 2014-07-02 LAB — DIFFERENTIAL
BASOS PCT: 1 %
Basophils Absolute: 0.1 10*3/uL (ref 0–0.1)
EOS ABS: 0.3 10*3/uL (ref 0–0.7)
EOS PCT: 3 %
Lymphocytes Relative: 25 %
Lymphs Abs: 2.3 10*3/uL (ref 1.0–3.6)
MONO ABS: 0.6 10*3/uL (ref 0.2–0.9)
Monocytes Relative: 7 %
Neutro Abs: 6 10*3/uL (ref 1.4–6.5)
Neutrophils Relative %: 64 %

## 2014-07-02 LAB — COMPREHENSIVE METABOLIC PANEL
ALBUMIN: 3.8 g/dL (ref 3.5–5.0)
ALK PHOS: 85 U/L (ref 38–126)
ALT: 17 U/L (ref 14–54)
ANION GAP: 9 (ref 5–15)
AST: 25 U/L (ref 15–41)
BUN: 10 mg/dL (ref 6–20)
CALCIUM: 9.1 mg/dL (ref 8.9–10.3)
CO2: 26 mmol/L (ref 22–32)
Chloride: 104 mmol/L (ref 101–111)
Creatinine, Ser: 0.89 mg/dL (ref 0.44–1.00)
GFR calc non Af Amer: >60 mL/min (ref >60)
Glucose, Bld: 88 mg/dL (ref 65–99)
POTASSIUM: 3.9 mmol/L (ref 3.5–5.1)
Sodium: 139 mmol/L (ref 135–145)
TOTAL PROTEIN: 6.8 g/dL (ref 6.5–8.1)
Total Bilirubin: 0.4 mg/dL (ref 0.3–1.2)

## 2014-07-02 LAB — CBC
HEMATOCRIT: 42.5 % (ref 35.0–47.0)
HEMOGLOBIN: 13.9 g/dL (ref 12.0–16.0)
MCH: 29.8 pg (ref 26.0–34.0)
MCHC: 32.8 g/dL (ref 32.0–36.0)
MCV: 91.1 fL (ref 80.0–100.0)
Platelets: 214 10*3/uL (ref 150–440)
RBC: 4.67 MIL/uL (ref 3.80–5.20)
RDW: 14.9 % — ABNORMAL HIGH (ref 11.5–14.5)
WBC: 9.3 10*3/uL (ref 3.6–11.0)

## 2014-07-02 LAB — CK: CK TOTAL: 220 U/L (ref 38–234)

## 2014-07-02 LAB — PROTIME-INR
INR: 0.92
Prothrombin Time: 12.6 seconds (ref 11.4–15.0)

## 2014-07-02 LAB — TROPONIN I

## 2014-07-02 LAB — APTT: APTT: 32 s (ref 24–36)

## 2014-07-02 MED ORDER — ASPIRIN 300 MG RE SUPP: 300.0000 mg | Freq: Every day | RECTAL | Status: DC

## 2014-07-02 MED ORDER — QUETIAPINE FUMARATE ER 50 MG PO TB24
200.0000 mg | ORAL_TABLET | Freq: Every day | ORAL | Status: DC
  Administered 2014-07-02: 200 mg via ORAL
  Filled 2014-07-02: qty 4

## 2014-07-02 MED ORDER — ESCITALOPRAM OXALATE 10 MG PO TABS
20.0000 mg | ORAL_TABLET | Freq: Every day | ORAL | Status: DC
  Administered 2014-07-03: 20 mg via ORAL
  Filled 2014-07-02: qty 2

## 2014-07-02 MED ORDER — ASPIRIN 300 MG RE SUPP: 300.0000 mg | Freq: Once | RECTAL | Status: DC

## 2014-07-02 MED ORDER — SODIUM CHLORIDE 0.9 % IV SOLN
INTRAVENOUS | Status: DC
  Administered 2014-07-02: 19:00:00 via INTRAVENOUS

## 2014-07-02 MED ORDER — ONDANSETRON HCL 4 MG/2ML IJ SOLN
INTRAMUSCULAR | Status: AC
  Filled 2014-07-02: qty 2

## 2014-07-02 MED ORDER — STROKE: EARLY STAGES OF RECOVERY BOOK
Freq: Once | Status: AC
  Administered 2014-07-02: 21:00:00
  Filled 2014-07-02 (×2): qty 1

## 2014-07-02 MED ORDER — LORAZEPAM 0.5 MG PO TABS
0.5000 mg | ORAL_TABLET | Freq: Four times a day (QID) | ORAL | Status: DC | PRN
  Filled 2014-07-02: qty 1

## 2014-07-02 MED ORDER — ALPRAZOLAM 1 MG PO TABS
2.0000 mg | ORAL_TABLET | ORAL | Status: DC | PRN
  Administered 2014-07-02 – 2014-07-03 (×2): 2 mg via ORAL
  Filled 2014-07-02 (×2): qty 2

## 2014-07-02 MED ORDER — METHYLPREDNISOLONE SODIUM SUCC 40 MG IJ SOLR
40.0000 mg | Freq: Four times a day (QID) | INTRAMUSCULAR | Status: DC
  Administered 2014-07-02 – 2014-07-03 (×3): 40 mg via INTRAVENOUS
  Filled 2014-07-02 (×3): qty 1

## 2014-07-02 MED ORDER — ASPIRIN EC 81 MG PO TBEC
81.0000 mg | DELAYED_RELEASE_TABLET | Freq: Every day | ORAL | Status: DC
  Administered 2014-07-02 – 2014-07-03 (×2): 81 mg via ORAL
  Filled 2014-07-02 (×2): qty 1

## 2014-07-02 MED ORDER — ENOXAPARIN SODIUM 40 MG/0.4ML ~~LOC~~ SOLN: 40.0000 mg | SUBCUTANEOUS | Status: DC

## 2014-07-02 MED ORDER — LORAZEPAM 2 MG/ML IJ SOLN: 0.5000 mg | Freq: Four times a day (QID) | INTRAMUSCULAR | Status: DC | PRN

## 2014-07-02 NOTE — Progress Notes (Signed)
Patient ID: Nicole Austin, female   DOB: 05-Dec-1960, 54 y.o.   MRN: [id number]   Patient passed swallow evaluation and asking for food. Diet ordered. Oral medications are restarted. I lowered the dose of her Xanax.

## 2014-07-02 NOTE — Progress Notes (Signed)
Patient ID: Nicole Austin, female   DOB: [birth year], 54 y.o.   MRN: [id number] Patient's cervical spine CT scan came back negative for acute fracture. Admission orders put in for suspected stroke. Rectal aspirin ordered. Swallow evaluation needed prior to ordering oral medication.

## 2014-07-02 NOTE — ED Notes (Signed)
MD at bedside--Dr Kaminski 

## 2014-07-02 NOTE — ED Notes (Signed)
Report given to RN on floor, Medical sales representative

## 2014-07-02 NOTE — ED Provider Notes (Signed)
[facility] Emergency Department Provider Note  ____________________________________________  Time seen: 1235  I have reviewed the triage vital signs and the nursing notes.  History of this patient is limited by the patient's altered mental status and reduced verbal communication. HISTORY  Chief Complaint Extremity Weakness  altered mental status, decreased verbal communication    HPI Nicole Austin is a 54 y.o. female who is brought to the emergency department by ambulance after her physical therapist came to her house today and found the patient had left-sided weakness.As noted in the nurse's documentation, the patient was last seen normal last evening. Apparently she began to have some weakness last night and had difficulty holding a cup of tea. The exact time of onset and the duration are not clear at this time. The severity is noted to be moderate to severe based on my initial examination of the patient. She has no prior experience with symptoms like these. These symptoms are consistent, without waxing or waning. Nothing seems to make him better or worse.     Past Medical History  Diagnosis Date  . COPD (chronic obstructive pulmonary disease)     There are no active problems to display for this patient.   No past surgical history on file.  No current outpatient prescriptions on file.  Allergies Review of patient's allergies indicates no known allergies.  No family history on file.  Social History History  Substance Use Topics  . Smoking status: Current Every Day Smoker -- 0.50 packs/day    Types: Cigarettes  . Smokeless tobacco: Not on file  . Alcohol Use: No    Review of Systems is unavailable due to the patient's limited communication.   ____________________________________________   PHYSICAL EXAM:  VITAL SIGNS: ED Triage Vitals  Enc Vitals Group     BP 07/02/14 1149 119/69 mmHg     Pulse Rate 07/02/14 1149 83     Resp  07/02/14 1149 19     Temp 07/02/14 1149 98.1 F (36.7 C)     Temp Source 07/02/14 1149 Oral     SpO2 07/02/14 1144 100 %     Weight 07/02/14 1149 260 lb (117.935 kg)     Height 07/02/14 1149 5\' 1"  (1.549 m)     Head Cir --      Peak Flow --      Pain Score 07/02/14 1149 10     Pain Loc --      Pain Edu? --      Excl. in [place]? --     Constitutional: Patient appears somnolent. She appeared asleep when I walked into the room. She was difficult to awaken. Slowly with noxious stimuli, she opened eyes and was able to answer some questions verbally. She has limitations with her speech at this time. ENT      Eyes: Pupils are 5 mm and slowly responsive - equal.   Head: Normocephalic and atraumatic.   Nose: No congestion/rhinnorhea.   Mouth/Throat: Mucous membranes are moist. Cardiovascular: Normal rate, regular rhythm at 81 Respiratory: Normal respiratory effort without tachypnea. Breath sounds are clear and equal bilaterally.  Gastrointestinal: Soft and nontender. No distention.  Back: There is no CVA tenderness. Musculoskeletal: Nontender without edema. Neurologic:  As noted above the patient is not very alert. Slowly she woke up and started to follow commands. This included the ability to slowly move her right hand and both feet, but she was unable to move her left arm or left hand at all.  Cranial nerves appear to be intact with no asymmetry. The rest of the neurologic exam is limited due to her mental status. Skin:  Skin is warm, dry. No rash noted. Psychiatric: Somnolent, only answers simple questions. ____________________________________________    LABS (pertinent positives/negatives)  CBC and comp wrens metabolic panel are overall unremarkable.troponin is within normal limits  ____________________________________________   EKG  ED ECG REPORT   Date: 07/02/2014  EKG Time: 1155  Rate: 83  Rhythm: normal EKG, normal sinus rhythm,   Axis: Within normal limits   Intervals: Within normal limits  ST&T Change: No changes noted   ____________________________________________    RADIOLOGY  ----------------------------------------- 1:03 PM on 07/02/2014 -----------------------------------------  IMPRESSION: No acute intracranial abnormality. Mild cerebral atrophy. No definite acute cortical infarction. The gray and white-matter differentiation is preserved.   ____________________________________________   PROCEDURES  Procedure(s) performed: None  Critical Care performed: Yes, see critical care note(s)  ____________________________________________   INITIAL IMPRESSION / ASSESSMENT AND PLAN / ED COURSE  ----------------------------------------- 1:00 PM on 07/02/2014 -----------------------------------------  At this time we are concerned for a CVA for this patient. She had a stat head CT with the interpretation pending. She is hemodynamically stable and has a good oxygen saturation level. We will continue supportive care as we collect additional data and then will likely speak with internal medicine for admission to the hospital.   ----------------------------------------- 2:04 PM on 07/02/2014 -----------------------------------------  As noted above her CT scan was within normal limits without an acute infarction noted. Her CBC is within reasonable limits. Her electrolytes are within reason. A recheck of the patient 15 minutes he has showed normal blood pressure and heart rate with an oxygen saturation level of 94% on room air. I have spoken with Dr. Earleen Newport of the [facility] hospitalist service for admission. We have reviewed her chart and noted that she had been admitted recently with rhabdomyolysis. A CK level has been added as well as a urine drug screen.  Given the patient's altered mental status and focal neurologic deficit, this patient required additional monitoring on my part and review of a broader differential diagnosis.  Critical care time of 30 minutes.   ____________________________________________   FINAL CLINICAL IMPRESSION(S) / ED DIAGNOSES  Final diagnoses:  Somnolence  Left arm weakness  CVA (cerebral vascular accident)      Ahmed Prima, MD 07/02/14 1409

## 2014-07-02 NOTE — Progress Notes (Signed)
Patient ID: Nicole Austin, female   DOB: [birth year], 54 y.o.   MRN: [id number]   Patient's MRI is negative for stroke. I spoke with the nurse on the floor. The patient has started to regain movement on her left side. As per the nurse she is very emotional. I will start Solu-Medrol just in case this is cervical radiculopathy. Await neurology evaluation. Can consider an MRI of the cervical spine if still having symptoms tomorrow. Still awaiting urine toxicology.

## 2014-07-02 NOTE — ED Notes (Signed)
Patient transported to CT 

## 2014-07-02 NOTE — ED Notes (Signed)
Lab notified about add on lab work.

## 2014-07-02 NOTE — ED Notes (Signed)
Pt back from MRI, placed on bedpan to void.  Will transfer to floor

## 2014-07-02 NOTE — ED Notes (Signed)
Pt. Arrived to ed via ems from home. Reports pt has her PT come to her house today and witness left side weakness.  Pt. Reports last night around 1900 she drop a cup of tea due to weakness and reports not feeling "good" since. Pt arrived  Alert and oriented to ED.  Last time seen normal was 07/01/14 1900. Pt experiencing difficulty speaking.  NO pervious deficits per EMS. Pt. Reports right arm pain on arrival.

## 2014-07-02 NOTE — ED Notes (Signed)
Pt is still off floor, will transfer up to floor on return

## 2014-07-02 NOTE — ED Notes (Signed)
Patient transported to X-ray 

## 2014-07-02 NOTE — H&P (Addendum)
[place] [facility] [place] NAME: Nicole Austin    MR#:  [id number]  DATE OF BIRTH:  1961-01-09  DATE OF ADMISSION:  07/02/2014  PRIMARY CARE PHYSICIAN: Dr. Hortencia Pilar  REQUESTING/REFERRING PHYSICIAN: Dr. Francene Castle  CHIEF COMPLAINT:   Chief Complaint  Patient presents with  . Extremity Weakness    HISTORY OF PRESENT ILLNESS:  Nicole Austin  is a 54 y.o. female with a known history of COPD, arthritis, bipolar disorder, PTSD, rhabdomyolysis. As per the ER physician, the patient had physical therapy came to her house and she had left-sided weakness. It was assumed that she had a stroke starting last night. When the patient first came in she needed a sternal rub to be aroused by the ER physician but now she is answering questions. The patient states that she was making coffee this morning around 9 or 9:30 this morning and was unable to move her hand at that time. Last night she did have a fall and was dropping things. She has been having some neck pain. She did have some slurred speech and is unable to move her left arm. The patient is also having left leg weakness. Hospitalist services were contacted for further evaluation.  When I came to talk to her she was complaining of neck pain also and can hardly move her neck on her own.  PAST MEDICAL HISTORY:   Past Medical History  Diagnosis Date  . COPD (chronic obstructive pulmonary disease)   . PTSD (post-traumatic stress disorder)   . Rhabdomyolysis   . Bipolar 1 disorder   . Arthritis     PAST SURGICAL HISTORY:   Past Surgical History  Procedure Laterality Date  . Replacement total knee Left     SOCIAL HISTORY:   History  Substance Use Topics  . Smoking status: Current Every Day Smoker -- 0.50 packs/day    Types: Cigarettes  . Smokeless tobacco: Not on file  . Alcohol Use: No    FAMILY HISTORY:  Both parents are alive and have no medical problems.  DRUG ALLERGIES:   As per previous record aspirin codeine and ibuprofen  REVIEW OF SYSTEMS:  CONSTITUTIONAL: No fever, fatigue. Left-sided weakness EYES: No blurred or double vision.  EARS, NOSE, AND THROAT: No tinnitus or ear pain. No sore throat. Slurred speech. RESPIRATORY: No cough, positive for shortness of breath shortness of breath, wheezing or hemoptysis.  CARDIOVASCULAR: No chest pain, orthopnea, edema.  GASTROINTESTINAL: No nausea, vomiting, diarrhea or abdominal pain. No blood in bowel movements GENITOURINARY: No dysuria, hematuria.  ENDOCRINE: No polyuria, nocturia,  HEMATOLOGY: No anemia, easy bruising or bleeding SKIN: No rash or lesion. MUSCULOSKELETAL: Positive for arthritis.  Unable to move left arm NEUROLOGIC: Left arm paralysis, left leg weakness  PSYCHIATRY: Positive for bipolar disorder  MEDICATIONS AT HOME:   Medication reconciliation process still undergoing.    VITAL SIGNS:  Blood pressure 114/69, pulse 83, temperature 98.1 F (36.7 C), temperature source Oral, resp. rate 20, height 5\' 1"  (1.549 m), weight 117.935 kg (260 lb), SpO2 94 %.  PHYSICAL EXAMINATION:  GENERAL:  54 y.o.-year-old patient lying in the bed with no acute distress.  EYES: Pupils equal, round and dilated, reactive to light and accommodation. No scleral icterus. Extraocular muscles intact.  HEENT: Head atraumatic, normocephalic. Oropharynx and nasopharynx clear.  NECK:  Supple, no jugular venous distention. No thyroid enlargement, neck tender to slight movement and patient unable to move on her own. LUNGS: Normal breath sounds  bilaterally, positive for wheezing throughout entire lung field. norales,rhonchi or crepitation. No use of accessory muscles of respiration.  CARDIOVASCULAR: S1, S2 normal. No murmurs, rubs, or gallops.  ABDOMEN: Soft, nontender, nondistended. Bowel sounds present. No organomegaly or mass.  EXTREMITIES: No pedal edema, cyanosis, or clubbing.  NEUROLOGIC: Patient lethargic. Patient  able to shrug shoulders. Patient has slurred speech. Patient unable to move left arm. Patient barely able to move left leg. Right arm and right leg power 5 out of 5. Sensation decreased to light touch on the left side PSYCHIATRIC: The patient is lethargic but does answer questions appropriately. SKIN: No rash, lesion, or ulcer.   LABORATORY PANEL:   CBC  Recent Labs Lab 07/02/14 1227  WBC 9.3  HGB 13.9  HCT 42.5  PLT 214   Chemistries   Recent Labs Lab 07/02/14 1227  NA 139  K 3.9  CL 104  CO2 26  GLUCOSE 88  BUN 10  CREATININE 0.89  CALCIUM 9.1  AST 25  ALT 17  ALKPHOS 85  BILITOT 0.4   Cardiac Enzymes  Recent Labs Lab 07/02/14 1243  TROPONINI <0.03   RADIOLOGY:  Dg Chest 1 View  07/02/2014   CLINICAL DATA:  History of COPD and tobacco use; possible evolving stroke  EXAM: CHEST  1 VIEW  COMPARISON:  Portable chest x-ray of April 17, 2014  FINDINGS: The lungs are adequately inflated and clear. The cardiac silhouette is enlarged. The pulmonary vascularity is normal. The mediastinum is normal in width. The trachea is midline. The bony thorax exhibits no acute abnormality.  IMPRESSION: There is no acute cardiopulmonary abnormality. There is stable cardiomegaly.   Electronically Signed   By: David  Martinique M.D.   On: 07/02/2014 13:41   Ct Head Wo Contrast  07/02/2014   CLINICAL DATA:  Left side weakness  EXAM: CT HEAD WITHOUT CONTRAST  TECHNIQUE: Contiguous axial images were obtained from the base of the skull through the vertex without intravenous contrast.  COMPARISON:  06/14/2014  FINDINGS: No intracranial hemorrhage, mass effect or midline shift.  Paranasal sinuses and mastoid air cells are unremarkable.  Ventricular size is stable from prior exam. Mild cerebral atrophy is stable. No definite acute cortical infarction. No mass lesion is noted on this unenhanced scan. The gray and white-matter differentiation is preserved  IMPRESSION: No acute intracranial abnormality.  Mild cerebral atrophy. No definite acute cortical infarction. The gray and white-matter differentiation is preserved.   Electronically Signed   By: Lahoma Crocker M.D.   On: 07/02/2014 12:27    EKG:   Normal sinus rhythm 83 bpm   IMPRESSION AND PLAN:   1 .need to rule out neurosurgical emergency prior to admission to the hospital. I will get a stat CT scan of the cervical spine. I spoke with radiology and ER physician about this plan.  2 left sided weakness, slurred speech, acute encephalopathy -If CT scan of the cervical spine is negative for fracture then I will admit to rule out stroke. -We'll get an MRI of the brain, carotid ultrasound and echocardiogram with bubble study. -Neuro, PT, OT and speech pathology consultations will be ordered. -Rectal aspirin. -Send toxicology. 3 history of rhabdomyolysis -we'll check a CPK and give IV fluids hydration.  4.bipolar disorder PTSD awaiting med reconciliation. 5. Tobacco abuse smoking cessation counseling done 3 minutes by me, will order a nicotine patch   All the records are reviewed and case discussed with ED provider. Management plans discussed with the patient and she is  in agreement.  CODE STATUS: Full   TOTAL TIME TAKING CARE OF THIS PATIENT: 60 minutes.    Loletha Grayer M.D on 07/02/2014 at 2:32 PM  Between 7am to 6pm - Pager - [phone]  After 6pm call admission pager [facility]  Office  [phone]  CC: Primary care physician; Bobetta Lime, MD

## 2014-07-02 NOTE — ED Notes (Signed)
Patient transported to [place] for carotid dopplers

## 2014-07-03 ENCOUNTER — Inpatient Hospital Stay: Payer: Commercial Managed Care - HMO

## 2014-07-03 DIAGNOSIS — G458 Other transient cerebral ischemic attacks and related syndromes: Secondary | ICD-10-CM

## 2014-07-03 LAB — LIPID PANEL
CHOL/HDL RATIO: 4.1 ratio
Cholesterol: 163 mg/dL (ref 0–200)
HDL: 40 mg/dL — AB (ref >40)
LDL CALC: 108 mg/dL — AB (ref 0–99)
Triglycerides: 75 mg/dL (ref <150)
VLDL: 15 mg/dL (ref 0–40)

## 2014-07-03 LAB — HEMOGLOBIN A1C: Hgb A1c MFr Bld: 5.5 % (ref 4.0–6.0)

## 2014-07-03 MED ORDER — ESCITALOPRAM OXALATE 20 MG PO TABS: 20.0000 mg | ORAL_TABLET | Freq: Every day | ORAL | Status: DC

## 2014-07-03 MED ORDER — QUETIAPINE FUMARATE ER 200 MG PO TB24: 200.0000 mg | ORAL_TABLET | Freq: Every day | ORAL | Status: DC

## 2014-07-03 NOTE — Evaluation (Signed)
Occupational Therapy Evaluation Patient Details Name: Nicole Austin MRN: [id number] DOB: 10-13-1960 Today's Date: 07/03/2014    History of Present Illness Nicole Austin is a 54 y.o. female with a known history of COPD, arthritis, bipolar disorder, PTSD, rhabdomyolysis. As per the ER physician, the patient had physical therapy came to her house and she had left-sided weakness. It was assumed that she had a stroke starting last night. When the patient first came in she needed a sternal rub to be aroused by the ER physician but now she is answering questions. The patient states that she was making coffee this morning around 9 or 9:30 this morning and was unable to move her hand at that time. Last night she did have a fall and was dropping things. She has been having some neck pain. She did have some slurred speech and is unable to move her left arm.    Clinical Impression   Patient is a 54 yo female who was admitted to [facility] with left sided weakness.  She was at home and her home health PT came and found her in the recliner chair.  When she went to get up, she felt weak and reports she could not move her left UE.  She could not grab or hold items on the left side.  MRI was negative and her symptoms are improved this date with active movement in the left UE and able to grasp items and use hand during tasks.  She present with decreased ROM, decreased strength, decreased coordination in left UE which limits her ability to perform tasks independently and with normal speed and accuracy.  She is independent with sit to stand but requires CGA for mobility.  She would benefit from skilled OT to maximize her safety and independence with self care tasks. She would likely benefit from home health OT to focus on LUE hand function as well as participation in basic self care skills    Follow Up Recommendations  Home health OT    Equipment Recommendations  Tub/shower seat    Recommendations for Other Services        Precautions / Restrictions Precautions Precautions: Fall Restrictions Weight Bearing Restrictions: No      Mobility Bed Mobility Overal bed mobility: Modified Independent                Transfers Overall transfer level: Modified independent Equipment used: None Transfers: Sit to/from Stand Sit to Stand: Modified independent (Device/Increase time)         General transfer comment: No gross LOB noted during transfer    Balance Overall balance assessment: Modified Independent Sitting-balance support: No upper extremity supported;Feet unsupported                                    ADL Overall ADL's : Needs assistance/impaired Eating/Feeding: Modified independent   Grooming: Modified independent   Upper Body Bathing: Set up   Lower Body Bathing: Moderate assistance   Upper Body Dressing : Modified independent   Lower Body Dressing: Supervision/safety   Toilet Transfer: Min guard   Toileting- Clothing Manipulation and Hygiene: Min guard       Functional mobility during ADLs: Min guard General ADL Comments: Patient has an aide at home 5 days a week for 2 hours a day.  PT home health 2 x a week.     Vision     Perception  Praxis      Pertinent Vitals/Pain Pain Assessment: No/denies pain     Hand Dominance Right   Extremity/Trunk Assessment Upper Extremity Assessment Upper Extremity Assessment: Defer to OT evaluation LUE Deficits / Details: Patient demonstrates difficulty with LUE ROM, 1st attempt at shoulder flexion to 70 degrees and then to 120 degrees.  Full elbow flexion/extension, wrist ext to 30 degrees and supination to 70 degrees.  Grip on left 42#, right 42#.  Slow to move left UE. LUE Coordination: decreased fine motor;decreased gross motor   Lower Extremity Assessment Lower Extremity Assessment: Overall WFL for tasks assessed (4-/5 bilateral hip flexion, 5/5 bilateral knee extension, 4+/5 bilateral knee  flexion and ankle dorsiflexion. No focal weaknes noted)       Communication Communication Communication: No difficulties   Cognition Arousal/Alertness: Awake/alert Behavior During Therapy: WFL for tasks assessed/performed Overall Cognitive Status: Within Functional Limits for tasks assessed       Memory: Decreased short-term memory             General Comments       Exercises       Shoulder Instructions      Home Living Family/patient expects to be discharged to:: Private residence Living Arrangements: Alone Available Help at Discharge: Home health;Personal care attendant Type of Home: Apartment Home Access: Level entry     Home Layout: One level     Bathroom Shower/Tub: Tub/shower unit Shower/tub characteristics: Architectural technologist: Standard Bathroom Accessibility: Yes How Accessible: Accessible via walker Foreman - single point;Walker - 4 wheels;Walker - 2 wheels          Prior Functioning/Environment Level of Independence: Needs assistance  Gait / Transfers Assistance Needed: she had progressed from walker to rollator to cane and reports she was walking with a cane indoors and outdoors.  ADL's / Homemaking Assistance Needed: Pt reports that she requires assist from [facility] aid for ADLs/IADLs.   She is unable to drive and does not work.        OT Diagnosis: Generalized weakness   OT Problem List: Decreased strength;Decreased coordination;Decreased range of motion;Impaired UE functional use   OT Treatment/Interventions: Self-care/ADL training;Therapeutic exercise;Neuromuscular education;Patient/family education    OT Goals(Current goals can be found in the care plan section) Acute Rehab OT Goals Patient Stated Goal: Patient wants to go home and reports she would like to be able to use her hand more for daily tasks. OT Goal Formulation: With patient Time For Goal Achievement: 07/17/14 Potential to Achieve Goals: Good  OT Frequency: Min  2X/week   Barriers to D/C:            Co-evaluation              End of Session Equipment Utilized During Treatment: Gait belt  Activity Tolerance: Patient tolerated treatment well;Patient limited by fatigue Patient left: in bed;with call bell/phone within reach;with bed alarm set   Time: 1015-1046 OT Time Calculation (min): 31 min Charges:  OT General Charges $OT Visit: 1 Procedure OT Evaluation $Initial OT Evaluation Tier I: 1 Procedure OT Treatments $Therapeutic Exercise: 8-22 mins G-Codes: OT G-codes **NOT FOR INPATIENT CLASS** Functional Assessment Tool Used: clinical judgment, dynamometer grip reading Functional Limitation: Self care Self Care Current Status (G2836): At least 40 percent but less than 60 percent impaired, limited or restricted Self Care Goal Status (O2947): At least 20 percent but less than 40 percent impaired, limited or restricted  Lovett,Amy 07/03/2014, 11:29 AM

## 2014-07-03 NOTE — Progress Notes (Signed)
Patient is discharge home in a stable condition , with moderate grip to all limbs and no facial drooping at discharge summary given and pt's to cal PMD for f/u appt. verbalized understanding,left in a taxi.

## 2014-07-03 NOTE — Progress Notes (Signed)
Notified physician about xanax order and diet order.

## 2014-07-03 NOTE — Discharge Instructions (Signed)
°  DIET:  °Cardiac diet ° °DISCHARGE CONDITION:  °Stable ° °ACTIVITY:  °Activity as tolerated ° °OXYGEN:  °Home Oxygen: No. °  °Oxygen Delivery: room air ° °DISCHARGE LOCATION:  °Home with home health  ° °If you experience worsening of your admission symptoms, develop shortness of breath, life threatening emergency, suicidal or homicidal thoughts you must seek medical attention immediately by calling 911 or calling your MD immediately  if symptoms less severe. ° °You Must read complete instructions/literature along with all the possible adverse reactions/side effects for all the Medicines you take and that have been prescribed to you. Take any new Medicines after you have completely understood and accpet all the possible adverse reactions/side effects.  ° °Please note ° °You were cared for by a hospitalist during your hospital stay. If you have any questions about your discharge medications or the care you received while you were in the hospital after you are discharged, you can call the unit and asked to speak with the hospitalist on call if the hospitalist that took care of you is not available. Once you are discharged, your primary care physician will handle any further medical issues. Please note that NO REFILLS for any discharge medications will be authorized once you are discharged, as it is imperative that you return to your primary care physician (or establish a relationship with a primary care physician if you do not have one) for your aftercare needs so that they can reassess your need for medications and monitor your lab values. ° ° °

## 2014-07-03 NOTE — Progress Notes (Signed)
Discussed home health providers with Ms Molenda. She reports that she already has [facility] providing PT.  [facility] are both providing home health aides. An unknown agency is also coming in twice a week to provide nursing monitoring.

## 2014-07-03 NOTE — Evaluation (Signed)
Speech Language Pathology Evaluation Patient Details Name: Kaiyla Stahly MRN: [id number] DOB: 19-Jun-1960 Today's Date: 07/03/2014 Time: 0263-7858 SLP Time Calculation (min) (ACUTE ONLY): 60 min  Problem List:  Patient Active Problem List   Diagnosis Date Noted  . Acute CVA (cerebrovascular accident) 07/02/2014   Past Medical History:  Past Medical History  Diagnosis Date  . COPD (chronic obstructive pulmonary disease)   . PTSD (post-traumatic stress disorder)   . Rhabdomyolysis   . Bipolar 1 disorder   . Arthritis    Past Surgical History:  Past Surgical History  Procedure Laterality Date  . Replacement total knee Left    HPI:   Pt denied any trouble swallowing or speech deficits. She stated she did not "feel herself" yesterday morning after waking up but "just needed some time", however, her txing PT arrived and called 911 and she was brought to the hospital. She stated she was eating meals and taking meds today w/out difficulty. NSG consulted and agreed.   Assessment / Plan / Recommendation Clinical Impression  Pt appeared to present w/ an adequate oropharyngeal swallow during this evaluation. Pt appears at reduced risk for aspiration w/ a regular diet consistency following general aspiration precautions. Pt denied any swallowing problems or deficits w/ her speech at this time.     SLP Assessment   Pt appears at her baseline for swallowing and speech at this time; per pt report as well.     Follow Up Recommendations   No further skilled ST services indicated at this time as pt appears at her baseline. NSG to reconsult if any change in status.   Frequency and Duration        Pertinent Vitals/Pain Pain Assessment: No/denies pain   SLP Goals  Patient/Family Stated Goal: to go home and celebrate her birthday; pt denied any trouble swallowing or deficits w/ her speech. Pt does not require further skilled ST services at this time.  SLP Evaluation Prior Functioning  Type of Home: Apartment Available Help at Discharge: Home health;Personal care attendant   Cognition  Overall Cognitive Status: Within Functional Limits for tasks assessed Orientation Level: Oriented X4    Comprehension       Expression Written Expression Dominant Hand: Right   Oral / Motor Oral Motor/Sensory Function Overall Oral Motor/Sensory Function: Appears within functional limits for tasks assessed   GO     Naaman Curro 07/03/2014, 11:46 AM

## 2014-07-03 NOTE — Evaluation (Signed)
Physical Therapy Evaluation Patient Details Name: Shalandria Elsbernd MRN: [id number] DOB: 09/13/1960 Today's Date: 07/03/2014   History of Present Illness  Kaniyah Lisby is a 54 y.o. female with a known history of COPD, arthritis, bipolar disorder, PTSD, rhabdomyolysis. As per the ER physician, the patient had physical therapy came to her house and she had left-sided weakness. It was assumed that she had a stroke starting last night. When the patient first came in she needed a sternal rub to be aroused by the ER physician but now she is answering questions. The patient states that she was making coffee this morning around 9 or 9:30 this morning and was unable to move her hand at that time. Last night she did have a fall and was dropping things. She has been having some neck pain. She did have some slurred speech and is unable to move her left arm.     Clinical Impression  Pt presents with baseline mobility and no acute deficits in LE strength during PT evaluation today. Her baseline mobility and balance are impaired and she would benefit from resuming her current [facility] PT to improve these impairments and decrease her risk for falls.     Follow Up Recommendations Home health PT (Continue [facility] PT for strength and balance)    Equipment Recommendations       Recommendations for Other Services       Precautions / Restrictions Precautions Precautions: Fall Restrictions Weight Bearing Restrictions: No      Mobility  Bed Mobility Overal bed mobility: Modified Independent                Transfers Overall transfer level: Modified independent Equipment used: None Transfers: Sit to/from Stand Sit to Stand: Modified independent (Device/Increase time)         General transfer comment: No gross LOB noted during transfer  Ambulation/Gait Ambulation/Gait assistance: Min guard Ambulation Distance (Feet): 75 Feet Assistive device:  (IV pole progressing to no assistive device) Gait  Pattern/deviations: Step-through pattern     General Gait Details: Decreased heel strike and step length bilateral. Overall decreased speed but not recorded  Stairs            Wheelchair Mobility    Modified Rankin (Stroke Patients Only)       Balance Overall balance assessment: Modified Independent Sitting-balance support: No upper extremity supported;Feet unsupported             Single Leg Stance - Right Leg: 8 Single Leg Stance - Left Leg: 8           High Level Balance Comments: Modified tandem without UE support >10 seconds             Pertinent Vitals/Pain Pain Assessment: No/denies pain    Home Living Family/patient expects to be discharged to:: Private residence Living Arrangements: Alone Available Help at Discharge: Home health;Personal care attendant Type of Home: Apartment Home Access: Level entry     Home Layout: One level Home Equipment: Cane - single point;Walker - 4 wheels;Walker - 2 wheels      Prior Function Level of Independence: Needs assistance   Gait / Transfers Assistance Needed: she had progressed from walker to rollator to cane and reports she was walking with a cane indoors and outdoors.   ADL's / Homemaking Assistance Needed: Pt reports that she requires assist from [facility] aid for ADLs/IADLs.   She is unable to drive and does not work.        Hand Dominance  Dominant Hand: Right    Extremity/Trunk Assessment   Upper Extremity Assessment: Defer to OT evaluation       LUE Deficits / Details: Patient demonstrates difficulty with LUE ROM, 1st attempt at shoulder flexion to 70 degrees and then to 120 degrees.  Full elbow flexion/extension, wrist ext to 30 degrees and supination to 70 degrees.  Grip on left 42#, right 42#.  Slow to move left UE.   Lower Extremity Assessment: Overall WFL for tasks assessed (4-/5 bilateral hip flexion, 5/5 bilateral knee extension, 4+/5 bilateral knee flexion and ankle dorsiflexion. No  focal weaknes noted)         Communication   Communication: No difficulties  Cognition Arousal/Alertness: Awake/alert Behavior During Therapy: WFL for tasks assessed/performed Overall Cognitive Status: Within Functional Limits for tasks assessed       Memory: Decreased short-term memory              General Comments      Exercises Hand Exercises Forearm Supination: AROM;Seated;Left;5 reps Wrist Extension: AROM;Seated;Left;5 reps Digit Composite Flexion: AROM;Seated;Left;10 reps Composite Extension: AROM;Seated;Left;10 reps Digit Composite Abduction: AROM;Seated;Left;10 reps Digit Composite Adduction: Left;10 reps Opposition: AROM;Seated;Left;10 reps      Assessment/Plan    PT Assessment Patient needs continued PT services  PT Diagnosis Difficulty walking   PT Problem List Decreased strength;Decreased balance  PT Treatment Interventions     PT Goals (Current goals can be found in the Care Plan section) Acute Rehab PT Goals Patient Stated Goal: "I want to go home so I can celebrate my birthday" PT Goal Formulation: With patient Time For Goal Achievement: 07/17/14 Potential to Achieve Goals: Good    Frequency Min 2X/week   Barriers to discharge        Co-evaluation               End of Session Equipment Utilized During Treatment: Gait belt Activity Tolerance: Patient tolerated treatment well Patient left: in bed;with bed alarm set;with call bell/phone within reach           Time: 1050-1100 PT Time Calculation (min) (ACUTE ONLY): 10 min   Charges:   PT Evaluation $Initial PT Evaluation Tier I: 1 Procedure     PT G Codes:   PT G-Codes **NOT FOR INPATIENT CLASS** Functional Assessment Tool Used: clinical judgment Functional Limitation: Mobility: Walking and moving around Mobility: Walking and Moving Around Current Status (C4888): At least 20 percent but less than 40 percent impaired, limited or restricted Mobility: Walking and Moving  Around Goal Status [phone]): At least 1 percent but less than 20 percent impaired, limited or restricted   Phillips Grout, PT [place] 07/03/2014, 11:14 AM

## 2014-07-03 NOTE — Progress Notes (Addendum)
Pt has been anxious, agitated, and tearful at times through  the night. Xanax PRN has been administered twice on shift and somehow effective. Quiet environment, emotional support, and resting encouragement provided. Pt had a lot of requests on shift. Neuro checks have been stable, no changes. Will continue to monitor.

## 2014-07-03 NOTE — Consult Note (Signed)
CC: L arm weakness   HPI: Nicole Austin is an 54 y.o. female with a known history of COPD, arthritis, bipolar disorder, PTSD, rhabdomyolysis. As per the ER physician, the patient had physical therapy came to her house and she had left-sided weakness. It was assumed that she had a stroke starting last night. When the patient first came in she needed a sternal rub to be aroused by the ER physician but now she is answering questions. The patient states that she was making coffee this morning around 9 or 9:30 this morning and was unable to move her hand at that time. Last night she did have a fall and was dropping things. Last seen by neurology in February for diffuse pain.  MRI brain done no acute ischemia.   Past Medical History  Diagnosis Date  . COPD (chronic obstructive pulmonary disease)   . PTSD (post-traumatic stress disorder)   . Rhabdomyolysis   . Bipolar 1 disorder   . Arthritis     Past Surgical History  Procedure Laterality Date  . Replacement total knee Left     History reviewed. No pertinent family history.  Social History:  reports that she has been smoking Cigarettes.  She has been smoking about 0.50 packs per day. She does not have any smokeless tobacco history on file. She reports that she does not drink alcohol or use illicit drugs.  No Known Allergies  Medications: I have reviewed the patient's current medications.  ROS: History obtained from the patient  General ROS: negative for - chills, fatigue, fever, night sweats, weight gain or weight loss Psychological ROS: negative for - behavioral disorder, hallucinations, memory difficulties, mood swings or suicidal ideation Ophthalmic ROS: negative for - blurry vision, double vision, eye pain or loss of vision ENT ROS: negative for - epistaxis, nasal discharge, oral lesions, sore throat, tinnitus or vertigo Allergy and Immunology ROS: negative for - hives or itchy/watery eyes Hematological and Lymphatic ROS:  negative for - bleeding problems, bruising or swollen lymph nodes Endocrine ROS: negative for - galactorrhea, hair pattern changes, polydipsia/polyuria or temperature intolerance Respiratory ROS: negative for - cough, hemoptysis, shortness of breath or wheezing Cardiovascular ROS: negative for - chest pain, dyspnea on exertion, edema or irregular heartbeat Gastrointestinal ROS: negative for - abdominal pain, diarrhea, hematemesis, nausea/vomiting or stool incontinence Genito-Urinary ROS: negative for - dysuria, hematuria, incontinence or urinary frequency/urgency Musculoskeletal ROS: negative for - joint swelling or muscular weakness Neurological ROS: as noted in HPI Dermatological ROS: negative for rash and skin lesion changes  Physical Examination: Blood pressure 110/49, pulse 89, temperature 98.5 F (36.9 C), temperature source Oral, resp. rate 18, height 5\' 1"  (1.549 m), weight 122.834 kg (270 lb 12.8 oz), SpO2 96 %.  HEENT-  Normocephalic, no lesions, without obvious abnormality.  Normal external eye and conjunctiva.  Normal TM's bilaterally.  Normal auditory canals and external ears. Normal external nose, mucus membranes and septum.  Normal pharynx. Cardiovascular- regular rate and rhythm, S1, S2 normal, no murmur, click, rub or gallop, pulses palpable throughout   Lungs- chest clear, no wheezing, rales, normal symmetric air entry, Heart exam - S1, S2 normal, no murmur, no gallop, rate regular Abdomen- soft, non-tender; bowel sounds normal; no masses,  no organomegaly Extremities- less then 2 second capillary refill Lymph-no adenopathy palpable Musculoskeletal-no joint tenderness, deformity or swelling Skin-warm and dry, no hyperpigmentation, vitiligo, or suspicious lesions  Neurological Examination Mental Status: Alert, oriented, thought content appropriate.  Speech fluent without evidence of aphasia.  Able to follow 3 step commands without difficulty. Cranial Nerves: II: Discs flat  bilaterally; Visual fields grossly normal, pupils equal, round, reactive to light and accommodation III,IV, VI: ptosis not present, extra-ocular motions intact bilaterally V,VII: smile symmetric, facial light touch sensation normal bilaterally VIII: hearing normal bilaterally IX,X: gag reflex present XI: bilateral shoulder shrug XII: midline tongue extension Motor: Right : Upper extremity   5/5    Left:     Upper extremity   4+/5-->drift  Lower extremity   5/5     Lower extremity   5/5 Tone and bulk:normal tone throughout; no atrophy noted Sensory: Pinprick and light touch intact throughout, bilaterally Deep Tendon Reflexes: 2+ and symmetric throughout Plantars: Right: downgoing   Left: downgoing Cerebellar: normal finger-to-nose, normal rapid alternating movements and normal heel-to-shin test Gait: not examined.      Laboratory Studies:   Basic Metabolic Panel:  Recent Labs Lab 07/02/14 1227  NA 139  K 3.9  CL 104  CO2 26  GLUCOSE 88  BUN 10  CREATININE 0.89  CALCIUM 9.1    Liver Function Tests:  Recent Labs Lab 07/02/14 1227  AST 25  ALT 17  ALKPHOS 85  BILITOT 0.4  PROT 6.8  ALBUMIN 3.8   No results for input(s): LIPASE, AMYLASE in the last 168 hours. No results for input(s): AMMONIA in the last 168 hours.  CBC:  Recent Labs Lab 07/02/14 1227  WBC 9.3  NEUTROABS 6.0  HGB 13.9  HCT 42.5  MCV 91.1  PLT 214    Cardiac Enzymes:  Recent Labs Lab 07/02/14 1243  CKTOTAL 220  TROPONINI <0.03    BNP: Invalid input(s): POCBNP  CBG: No results for input(s): GLUCAP in the last 168 hours.  Microbiology: Results for orders placed or performed during the hospital encounter of 06/14/14  Urine culture     Status: None   Collection Time: 06/14/14  4:50 PM  Result Value Ref Range Status   Micro Text Report   Final       SOURCE: CLEAN CATCH    ORGANISM 1                >100,000 CFU/ML Candida glabrata   COMMENT                   -    ANTIBIOTIC                    ORG#1                                                 Coagulation Studies:  Recent Labs  07/02/14 1227  LABPROT 12.6  INR 0.92    Urinalysis: No results for input(s): COLORURINE, LABSPEC, PHURINE, GLUCOSEU, HGBUR, BILIRUBINUR, KETONESUR, PROTEINUR, UROBILINOGEN, NITRITE, LEUKOCYTESUR in the last 168 hours.  Invalid input(s): APPERANCEUR  Lipid Panel:     Component Value Date/Time   CHOL 163 07/03/2014 0500   TRIG 75 07/03/2014 0500   HDL 40* 07/03/2014 0500   CHOLHDL 4.1 07/03/2014 0500   VLDL 15 07/03/2014 0500   LDLCALC 108* 07/03/2014 0500    HgbA1C: No results found for: HGBA1C  Urine Drug Screen:  No results found for: LABOPIA, COCAINSCRNUR, LABBENZ, AMPHETMU, THCU, LABBARB  Alcohol Level: No results for input(s): ETH in the last 168 hours.  Other results: EKG: normal  EKG, normal sinus rhythm, unchanged from previous tracings.  Imaging: Dg Chest 1 View  07/02/2014   CLINICAL DATA:  History of COPD and tobacco use; possible evolving stroke  EXAM: CHEST  1 VIEW  COMPARISON:  Portable chest x-ray of April 17, 2014  FINDINGS: The lungs are adequately inflated and clear. The cardiac silhouette is enlarged. The pulmonary vascularity is normal. The mediastinum is normal in width. The trachea is midline. The bony thorax exhibits no acute abnormality.  IMPRESSION: There is no acute cardiopulmonary abnormality. There is stable cardiomegaly.   Electronically Signed   By: David  Martinique M.D.   On: 07/02/2014 13:41   Ct Head Wo Contrast  07/02/2014   CLINICAL DATA:  Left side weakness  EXAM: CT HEAD WITHOUT CONTRAST  TECHNIQUE: Contiguous axial images were obtained from the base of the skull through the vertex without intravenous contrast.  COMPARISON:  06/14/2014  FINDINGS: No intracranial hemorrhage, mass effect or midline shift.  Paranasal sinuses and mastoid air cells are unremarkable.  Ventricular size is stable from prior exam. Mild cerebral  atrophy is stable. No definite acute cortical infarction. No mass lesion is noted on this unenhanced scan. The gray and white-matter differentiation is preserved  IMPRESSION: No acute intracranial abnormality. Mild cerebral atrophy. No definite acute cortical infarction. The gray and white-matter differentiation is preserved.   Electronically Signed   By: Lahoma Crocker M.D.   On: 07/02/2014 12:27   Ct Cervical Spine Wo Contrast  07/02/2014   CLINICAL DATA:  54 year old female with a history of left-sided weakness.  EXAM: CT CERVICAL SPINE WITHOUT CONTRAST  TECHNIQUE: Multidetector CT imaging of the cervical spine was performed without intravenous contrast. Multiplanar CT image reconstructions were also generated.  COMPARISON:  None.  FINDINGS: Craniocervical junction is unremarkable, without evidence of acute fracture.  Cervical levels from this level of C1-T1 maintain alignment.  Trace anterolisthesis of C3 on C4.  No subluxation.  No acute fracture line identified.  Vertebral bodies with relative anatomic height maintained.  Degenerative disc disease worst at the C4-C5 and C5-C6 levels were there is bilateral uncovertebral joint spurring, worst on the right at both levels.  Mild bilateral facet disease. Posterior disc osteophyte complex at the C5-C6 level appears that it may contact the anterior thecal sac.  Paraseptal emphysema at the lung apices.  Soft tissues of the neck unremarkable, with symmetric contour maintained. Myofacial planes maintained. No focal soft tissue mass or fluid collection.  Unremarkable appearance of the thyroid.  IMPRESSION: No CT evidence of acute fracture or malalignment of the cervical spine.  Multilevel degenerative disc disease with uncovertebral joint disease, worst at the level of C4-C5 and C5-C6. The posterior disc osteophyte complex at C5-C6 appears to contact the anterior thecal sac. At each of these levels there appears to be right-sided neural foraminal narrowing.  Signed,   Dulcy Fanny. Earleen Newport, DO  [facility]  [facility]   Electronically Signed   By: Corrie Mckusick D.O.   On: 07/02/2014 15:02   Mr Brain Wo Contrast  07/02/2014   CLINICAL DATA:  54 year old female with left side weakness and slurred speech for 1 day. Initial encounter.  EXAM: MRI HEAD WITHOUT CONTRAST  TECHNIQUE: Multiplanar, multiecho pulse sequences of the brain and surrounding structures were obtained without intravenous contrast.  COMPARISON:  Head CT 1215 hours today, and earlier.  FINDINGS: Cerebral volume is within normal limits for age. No restricted diffusion to suggest acute infarction. No midline shift, mass effect,  evidence of mass lesion, ventriculomegaly, extra-axial collection or acute intracranial hemorrhage. Cervicomedullary junction and pituitary are within normal limits. Major intracranial vascular flow voids are preserved. Pearline Cables and white matter signal is within normal limits for age throughout the brain.  Visible internal auditory structures appear normal. Trace right mastoid fluid. Trace paranasal sinus mucosal thickening. Visualized orbit soft tissues are within normal limits. Visualized scalp soft tissues are within normal limits. Negative visualized cervical spine. Bone marrow signal is within normal limits.  IMPRESSION: Normal noncontrast MRI appearance of the brain.   Electronically Signed   By: Genevie Ann M.D.   On: 07/02/2014 17:33   US Carotid Bilateral  07/02/2014   CLINICAL DATA:  Left-sided weakness, visual disturbance, smoker  EXAM: BILATERAL CAROTID DUPLEX ULTRASOUND  TECHNIQUE: Pearline Cables scale imaging, color Doppler and duplex ultrasound were performed of bilateral carotid and vertebral arteries in the neck.  COMPARISON:  None.  FINDINGS: Criteria: Quantification of carotid stenosis is based on velocity parameters that correlate the residual internal carotid diameter with NASCET-based stenosis levels, using the diameter of the distal internal  carotid lumen as the denominator for stenosis measurement.  The following velocity measurements were obtained:  RIGHT  ICA:  155/33 cm/sec  CCA:  229/79 cm/sec  SYSTOLIC ICA/CCA RATIO:  1.4  DIASTOLIC ICA/CCA RATIO:  1.3  ECA:  104 cm/sec  LEFT  ICA:  113/32 cm/sec  CCA:  892/11 cm/sec  SYSTOLIC ICA/CCA RATIO:  1.1  DIASTOLIC ICA/CCA RATIO:  1.8  ECA:  100 cm/sec  RIGHT CAROTID ARTERY: Mild tortuosity. Minor echogenic shadowing plaque formation. No hemodynamically significant right ICA stenosis, velocity elevation, or turbulent flow. Degree of narrowing less than 50%.  RIGHT VERTEBRAL ARTERY:  Antegrade  LEFT CAROTID ARTERY: Similar scattered minor echogenic plaque formation. No hemodynamically significant left ICA stenosis, velocity elevation, or turbulent flow.  LEFT VERTEBRAL ARTERY:  Antegrade  IMPRESSION: Minor carotid atherosclerosis. No hemodynamically significant ICA stenosis. Degree of narrowing less than 50% bilaterally.   Electronically Signed   By: Jerilynn Mages.  Shick M.D.   On: 07/02/2014 17:00     Assessment/Plan: 54 y/o F. with a known history of COPD, arthritis, bipolar disorder, PTSD, rhabdomyolysis admitted with LUE weakness. Significant improvement since yesterday.  On examination there is drift LUE and pt has significant give away weakness.  Last seen by neuro in February.    - close to baseline with giveaway weakness. No acute abnormality of MRI brain - started on solumedrol- will  D/c as pt had imaging of C spine few months back - con't anti platelet therapy - pt has home PT can be d/c today and pt does want to go home -  Psych f/up as out pt.  - f/up Utox screen was positive for multiple agents on last admission.   Leotis Pain   07/03/2014, 8:58 AM

## 2014-07-03 NOTE — Progress Notes (Signed)
Chaplain [facility]) provided pastoral presence to PT and explained A.D. Process.  Pinole Syrianna [phone]   07/03/14 0940  Clinical Encounter Type  Visited With Patient  Visit Type Spiritual support (A.D. education)  Referral From Chaplain  Spiritual Encounters  Spiritual Needs Prayer  Stress Factors  Patient Stress Factors Major life changes (PT stated concern over uncertainty of health.)  Family Stress Factors None identified  Advance Directives (For Healthcare)  Does patient have an advance directive? No  Would patient like information on creating an advanced directive? Yes - Scientist, clinical (histocompatibility and immunogenetics) given

## 2014-07-03 NOTE — Patient Instructions (Signed)
Instruction to patient on ROM for LUE and coordination tasks including oppositional movements and grasp and release of small objects.

## 2014-07-04 DIAGNOSIS — F1910 Other psychoactive substance abuse, uncomplicated: Secondary | ICD-10-CM | POA: Insufficient documentation

## 2014-07-04 NOTE — Discharge Summary (Signed)
Nicole [facility] at [facility] NAME: Nicole Austin    MR#:  [id number]  DATE OF BIRTH:  05/08/1960  DATE OF ADMISSION:  07/02/2014 ADMITTING PHYSICIAN: Nicole Grayer, MD  DATE OF DISCHARGE: 07/03/2014  2:27 PM  PRIMARY CARE PHYSICIAN: Nicole Lime, MD    ADMISSION DIAGNOSIS:  Somnolence [R40.0] CVA (cerebral vascular accident) [I63.9] Left arm weakness [R29.898] Left-sided weakness [M62.89]  DISCHARGE DIAGNOSIS:  Left sided weakness, no acute CVA   SECONDARY DIAGNOSIS:   Past Medical History  Diagnosis Date  . COPD (chronic obstructive pulmonary disease)   . PTSD (post-traumatic stress disorder)   . Rhabdomyolysis   . Bipolar 1 disorder   . Arthritis     HOSPITAL COURSE:    1) left sided weakness, slurred speech, acute encephalopathy - symptoms improved at time of discharge. At baseline with some weakness of LUE. - CT and MRI negative for acute change - neurology consultation appreciated - dc home with continued HHPT, RN - UDS + for benzo's (prescribed), opiates (not on med list) and canabiniods. Likely contributing to symptoms.   DISCHARGE CONDITIONS:   stable  CONSULTS OBTAINED:  Treatment Team:  Nicole Pain, MD  DRUG ALLERGIES:  No Known Allergies  DISCHARGE MEDICATIONS:   Discharge Medication List as of 07/03/2014 12:26 PM    CONTINUE these medications which have NOT CHANGED   Details  alprazolam (XANAX) 2 MG tablet Take 2 mg by mouth 4 (four) times daily as needed for anxiety., Until Discontinued, Historical Med    escitalopram (LEXAPRO) 20 MG tablet Take 20 mg by mouth daily., Until Discontinued, Historical Med    Ipratropium-Albuterol (COMBIVENT) 20-100 MCG/ACT AERS respimat Inhale 1 puff into the lungs every 6 (six) hours., Until Discontinued, Historical Med    pregabalin (LYRICA) 150 MG capsule Take 150 mg by mouth 3 (three) times daily., Until Discontinued, Historical Med     QUEtiapine (SEROQUEL XR) 200 MG 24 hr tablet Take 200 mg by mouth every evening., Until Discontinued, Historical Med    Tiotropium Bromide-Olodaterol 2.5-2.5 MCG/ACT AERS Inhale 2 puffs into the lungs daily. , Until Discontinued, Historical Med    triamcinolone (KENALOG) 0.1 % paste Use as directed 1 application in the mouth or throat 3 (three) times daily., Until Discontinued, Historical Med         DISCHARGE INSTRUCTIONS:     DIET:  Cardiac diet  DISCHARGE CONDITION:  Stable  ACTIVITY:  Activity as tolerated  OXYGEN:  Home Oxygen: No.   Oxygen Delivery: room air  DISCHARGE LOCATION:  home with home health services  Today   CHIEF COMPLAINT:   Chief Complaint  Patient presents with  . Extremity Weakness    HISTORY OF PRESENT ILLNESS:   Nicole Austin is a 54 y.o. female with a known history of COPD, arthritis, bipolar disorder, PTSD, rhabdomyolysis. As per the ER physician, the patient had physical therapy came to her house and she had left-sided weakness. It was assumed that she had a stroke starting last night. When the patient first came in she needed a sternal rub to be aroused by the ER physician but now she is answering questions. The patient states that she was making coffee this morning around 9 or 9:30 this morning and was unable to move her hand at that time. Last night she did have a fall and was dropping things. She has been having some neck Austin. She did have some slurred speech and is unable to  move her left arm. The patient is also having left leg weakness. Hospitalist services were contacted for further evaluation.  VITAL SIGNS:  Blood pressure 130/64, pulse 85, temperature 98.7 F (37.1 C), temperature source Oral, resp. rate 18, height 5\' 1"  (1.549 m), weight 122.834 kg (270 lb 12.8 oz), SpO2 97 %.  I/O:   Intake/Output Summary (Last 24 hours) at 07/04/14 0756 Last data filed at 07/03/14 1226  Gross per 24 hour  Intake    370 ml  Output   1000  ml  Net   -630 ml    PHYSICAL EXAMINATION:  GENERAL:  54 y.o.-year-old patient lying in the bed with no acute distress. obese EYES: Pupils equal, round, reactive to light and accommodation. No scleral icterus. Extraocular muscles intact.  HEENT: Head atraumatic, normocephalic. Oropharynx and nasopharynx clear.  NECK:  Supple, no jugular venous distention. No thyroid enlargement, no tenderness.  LUNGS: Normal breath sounds bilaterally, no wheezing, rales,rhonchi or crepitation. No use of accessory muscles of respiration.  CARDIOVASCULAR: S1, S2 normal. No murmurs, rubs, or gallops.  ABDOMEN: Soft, non-tender, non-distended. Bowel sounds present. No organomegaly or mass.  EXTREMITIES: No pedal edema, cyanosis, or clubbing.  NEUROLOGIC: Cranial nerves II through XII are intact. Muscle strength 5/5 in all extremities except LUE 4/5. Sensation intact. Gait not checked.  PSYCHIATRIC: The patient is alert and oriented x 3, anxious.  SKIN: No obvious rash, lesion, or ulcer.   DATA REVIEW:   CBC  Recent Labs Lab 07/02/14 1227  WBC 9.3  HGB 13.9  HCT 42.5  PLT 214    Chemistries   Recent Labs Lab 07/02/14 1227  NA 139  K 3.9  CL 104  CO2 26  GLUCOSE 88  BUN 10  CREATININE 0.89  CALCIUM 9.1  AST 25  ALT 17  ALKPHOS 85  BILITOT 0.4    Cardiac Enzymes  Recent Labs Lab 07/02/14 1243  TROPONINI <0.03    Microbiology Results  Results for orders placed or performed during the hospital encounter of 06/14/14  Urine culture     Status: None   Collection Time: 06/14/14  4:50 PM  Result Value Ref Range Status   Micro Text Report   Final       SOURCE: CLEAN CATCH    ORGANISM 1                >100,000 CFU/ML Candida glabrata   COMMENT                   -   ANTIBIOTIC                    ORG#1                                                 RADIOLOGY:  Dg Chest 1 View  07/02/2014   CLINICAL DATA:  History of COPD and tobacco use; possible evolving stroke  EXAM: CHEST   1 VIEW  COMPARISON:  Portable chest x-ray of April 17, 2014  FINDINGS: The lungs are adequately inflated and clear. The cardiac silhouette is enlarged. The pulmonary vascularity is normal. The mediastinum is normal in width. The trachea is midline. The bony thorax exhibits no acute abnormality.  IMPRESSION: There is no acute cardiopulmonary abnormality. There is stable cardiomegaly.   Electronically Signed   By: Shanon Brow  [place] M.D.   On: 07/02/2014 13:41   Ct Head Wo Contrast  07/02/2014   CLINICAL DATA:  Left side weakness  EXAM: CT HEAD WITHOUT CONTRAST  TECHNIQUE: Contiguous axial images were obtained from the base of the skull through the vertex without intravenous contrast.  COMPARISON:  06/14/2014  FINDINGS: No intracranial hemorrhage, mass effect or midline shift.  Paranasal sinuses and mastoid air cells are unremarkable.  Ventricular size is stable from prior exam. Mild cerebral atrophy is stable. No definite acute cortical infarction. No mass lesion is noted on this unenhanced scan. The gray and white-matter differentiation is preserved  IMPRESSION: No acute intracranial abnormality. Mild cerebral atrophy. No definite acute cortical infarction. The gray and white-matter differentiation is preserved.   Electronically Signed   By: Lahoma Crocker M.D.   On: 07/02/2014 12:27   Ct Cervical Spine Wo Contrast  07/02/2014   CLINICAL DATA:  54 year old female with a history of left-sided weakness.  EXAM: CT CERVICAL SPINE WITHOUT CONTRAST  TECHNIQUE: Multidetector CT imaging of the cervical spine was performed without intravenous contrast. Multiplanar CT image reconstructions were also generated.  COMPARISON:  None.  FINDINGS: Craniocervical junction is unremarkable, without evidence of acute fracture.  Cervical levels from this level of C1-T1 maintain alignment.  Trace anterolisthesis of C3 on C4.  No subluxation.  No acute fracture line identified.  Vertebral bodies with relative anatomic height maintained.   Degenerative disc disease worst at the C4-C5 and C5-C6 levels were there is bilateral uncovertebral joint spurring, worst on the right at both levels.  Mild bilateral facet disease. Posterior disc osteophyte complex at the C5-C6 level appears that it may contact the anterior thecal sac.  Paraseptal emphysema at the lung apices.  Soft tissues of the neck unremarkable, with symmetric contour maintained. Myofacial planes maintained. No focal soft tissue mass or fluid collection.  Unremarkable appearance of the thyroid.  IMPRESSION: No CT evidence of acute fracture or malalignment of the cervical spine.  Multilevel degenerative disc disease with uncovertebral joint disease, worst at the level of C4-C5 and C5-C6. The posterior disc osteophyte complex at C5-C6 appears to contact the anterior thecal sac. At each of these levels there appears to be right-sided neural foraminal narrowing.  Signed,  Dulcy Fanny. Earleen Newport, DO  [facility]  [facility]   Electronically Signed   By: Corrie Mckusick D.O.   On: 07/02/2014 15:02   Mr Brain Wo Contrast  07/02/2014   CLINICAL DATA:  54 year old female with left side weakness and slurred speech for 1 day. Initial encounter.  EXAM: MRI HEAD WITHOUT CONTRAST  TECHNIQUE: Multiplanar, multiecho pulse sequences of the brain and surrounding structures were obtained without intravenous contrast.  COMPARISON:  Head CT 1215 hours today, and earlier.  FINDINGS: Cerebral volume is within normal limits for age. No restricted diffusion to suggest acute infarction. No midline shift, mass effect, evidence of mass lesion, ventriculomegaly, extra-axial collection or acute intracranial hemorrhage. Cervicomedullary junction and pituitary are within normal limits. Major intracranial vascular flow voids are preserved. Pearline Cables and white matter signal is within normal limits for age throughout the brain.  Visible internal auditory structures appear normal. Trace right  mastoid fluid. Trace paranasal sinus mucosal thickening. Visualized orbit soft tissues are within normal limits. Visualized scalp soft tissues are within normal limits. Negative visualized cervical spine. Bone marrow signal is within normal limits.  IMPRESSION: Normal noncontrast MRI appearance of the brain.   Electronically Signed   By: Herminio Heads.D.  On: 07/02/2014 17:33   US Carotid Bilateral  07/02/2014   CLINICAL DATA:  Left-sided weakness, visual disturbance, smoker  EXAM: BILATERAL CAROTID DUPLEX ULTRASOUND  TECHNIQUE: Pearline Cables scale imaging, color Doppler and duplex ultrasound were performed of bilateral carotid and vertebral arteries in the neck.  COMPARISON:  None.  FINDINGS: Criteria: Quantification of carotid stenosis is based on velocity parameters that correlate the residual internal carotid diameter with NASCET-based stenosis levels, using the diameter of the distal internal carotid lumen as the denominator for stenosis measurement.  The following velocity measurements were obtained:  RIGHT  ICA:  155/33 cm/sec  CCA:  161/09 cm/sec  SYSTOLIC ICA/CCA RATIO:  1.4  DIASTOLIC ICA/CCA RATIO:  1.3  ECA:  104 cm/sec  LEFT  ICA:  113/32 cm/sec  CCA:  604/54 cm/sec  SYSTOLIC ICA/CCA RATIO:  1.1  DIASTOLIC ICA/CCA RATIO:  1.8  ECA:  100 cm/sec  RIGHT CAROTID ARTERY: Mild tortuosity. Minor echogenic shadowing plaque formation. No hemodynamically significant right ICA stenosis, velocity elevation, or turbulent flow. Degree of narrowing less than 50%.  RIGHT VERTEBRAL ARTERY:  Antegrade  LEFT CAROTID ARTERY: Similar scattered minor echogenic plaque formation. No hemodynamically significant left ICA stenosis, velocity elevation, or turbulent flow.  LEFT VERTEBRAL ARTERY:  Antegrade  IMPRESSION: Minor carotid atherosclerosis. No hemodynamically significant ICA stenosis. Degree of narrowing less than 50% bilaterally.   Electronically Signed   By: Jerilynn Mages.  Shick M.D.   On: 07/02/2014 17:00    EKG:   Orders placed or  performed during the hospital encounter of 07/02/14  . EKG      Management plans discussed with the patient, family and they are in agreement.  CODE STATUS: full  TOTAL TIME TAKING CARE OF THIS PATIENT: 35 minutes.    Myrtis Ser M.D on 07/04/2014 at 7:56 AM  Between 7am to 6pm - Pager - [phone]  After 6pm go to [email or web address] - password [email or web address] [place] [facility]  Office  [phone]  CC: Primary care physician; Nicole Lime, MD

## 2014-07-05 DIAGNOSIS — F17210 Nicotine dependence, cigarettes, uncomplicated: Secondary | ICD-10-CM | POA: Diagnosis not present

## 2014-07-05 DIAGNOSIS — M6282 Rhabdomyolysis: Secondary | ICD-10-CM | POA: Diagnosis not present

## 2014-07-05 DIAGNOSIS — I639 Cerebral infarction, unspecified: Secondary | ICD-10-CM | POA: Diagnosis not present

## 2014-07-05 DIAGNOSIS — M1990 Unspecified osteoarthritis, unspecified site: Secondary | ICD-10-CM | POA: Diagnosis not present

## 2014-07-05 DIAGNOSIS — F4310 Post-traumatic stress disorder, unspecified: Secondary | ICD-10-CM | POA: Diagnosis not present

## 2014-07-05 DIAGNOSIS — F319 Bipolar disorder, unspecified: Secondary | ICD-10-CM | POA: Diagnosis not present

## 2014-07-05 DIAGNOSIS — R202 Paresthesia of skin: Secondary | ICD-10-CM | POA: Diagnosis not present

## 2014-07-05 DIAGNOSIS — F3189 Other bipolar disorder: Secondary | ICD-10-CM | POA: Diagnosis not present

## 2014-07-05 DIAGNOSIS — J449 Chronic obstructive pulmonary disease, unspecified: Secondary | ICD-10-CM | POA: Diagnosis not present

## 2014-07-06 DIAGNOSIS — F3189 Other bipolar disorder: Secondary | ICD-10-CM | POA: Diagnosis not present

## 2014-07-07 DIAGNOSIS — F319 Bipolar disorder, unspecified: Secondary | ICD-10-CM | POA: Diagnosis not present

## 2014-07-07 DIAGNOSIS — M6282 Rhabdomyolysis: Secondary | ICD-10-CM | POA: Diagnosis not present

## 2014-07-07 DIAGNOSIS — J449 Chronic obstructive pulmonary disease, unspecified: Secondary | ICD-10-CM | POA: Diagnosis not present

## 2014-07-07 DIAGNOSIS — F17210 Nicotine dependence, cigarettes, uncomplicated: Secondary | ICD-10-CM | POA: Diagnosis not present

## 2014-07-07 DIAGNOSIS — F4310 Post-traumatic stress disorder, unspecified: Secondary | ICD-10-CM | POA: Diagnosis not present

## 2014-07-07 DIAGNOSIS — M1990 Unspecified osteoarthritis, unspecified site: Secondary | ICD-10-CM | POA: Diagnosis not present

## 2014-07-07 DIAGNOSIS — F3189 Other bipolar disorder: Secondary | ICD-10-CM | POA: Diagnosis not present

## 2014-07-08 DIAGNOSIS — F4310 Post-traumatic stress disorder, unspecified: Secondary | ICD-10-CM | POA: Diagnosis not present

## 2014-07-08 DIAGNOSIS — F3189 Other bipolar disorder: Secondary | ICD-10-CM | POA: Diagnosis not present

## 2014-07-08 DIAGNOSIS — F17210 Nicotine dependence, cigarettes, uncomplicated: Secondary | ICD-10-CM | POA: Diagnosis not present

## 2014-07-08 DIAGNOSIS — F319 Bipolar disorder, unspecified: Secondary | ICD-10-CM | POA: Diagnosis not present

## 2014-07-08 DIAGNOSIS — J449 Chronic obstructive pulmonary disease, unspecified: Secondary | ICD-10-CM | POA: Diagnosis not present

## 2014-07-08 DIAGNOSIS — M6282 Rhabdomyolysis: Secondary | ICD-10-CM | POA: Diagnosis not present

## 2014-07-08 DIAGNOSIS — M1990 Unspecified osteoarthritis, unspecified site: Secondary | ICD-10-CM | POA: Diagnosis not present

## 2014-07-09 ENCOUNTER — Other Ambulatory Visit: Payer: Self-pay | Admitting: *Deleted

## 2014-07-09 DIAGNOSIS — F17210 Nicotine dependence, cigarettes, uncomplicated: Secondary | ICD-10-CM | POA: Diagnosis not present

## 2014-07-09 DIAGNOSIS — J449 Chronic obstructive pulmonary disease, unspecified: Secondary | ICD-10-CM | POA: Diagnosis not present

## 2014-07-09 DIAGNOSIS — F319 Bipolar disorder, unspecified: Secondary | ICD-10-CM | POA: Diagnosis not present

## 2014-07-09 DIAGNOSIS — M6282 Rhabdomyolysis: Secondary | ICD-10-CM | POA: Diagnosis not present

## 2014-07-09 DIAGNOSIS — F4310 Post-traumatic stress disorder, unspecified: Secondary | ICD-10-CM | POA: Diagnosis not present

## 2014-07-09 DIAGNOSIS — F3189 Other bipolar disorder: Secondary | ICD-10-CM | POA: Diagnosis not present

## 2014-07-09 DIAGNOSIS — M1990 Unspecified osteoarthritis, unspecified site: Secondary | ICD-10-CM | POA: Diagnosis not present

## 2014-07-09 NOTE — Patient Outreach (Signed)
[facility]) Care Management  07/09/2014  Nicole Austin 1960-07-04 [id number]   Phone call to patient to assess for social work needs and community resources that patient may benefit from.  Patient states that she uses [facility], however would like more information regarding transportation benefit through her insurance.  Patient discussed grief issues related to the sudden death of her nephew.  Patient willing to schedule home visit to complete social work assessment and to provide resources for transportation and grief and loss. Home visit scheduled for 07/20/14 at [place], [place] Management [phone]

## 2014-07-11 DIAGNOSIS — R0902 Hypoxemia: Secondary | ICD-10-CM | POA: Diagnosis not present

## 2014-07-11 DIAGNOSIS — J449 Chronic obstructive pulmonary disease, unspecified: Secondary | ICD-10-CM | POA: Diagnosis not present

## 2014-07-12 DIAGNOSIS — F3189 Other bipolar disorder: Secondary | ICD-10-CM | POA: Diagnosis not present

## 2014-07-13 ENCOUNTER — Other Ambulatory Visit: Payer: Self-pay

## 2014-07-13 ENCOUNTER — Emergency Department: Payer: Commercial Managed Care - HMO

## 2014-07-13 ENCOUNTER — Emergency Department
Admission: EM | Admit: 2014-07-13 | Discharge: 2014-07-13 | Disposition: A | Discharge status: Home / Self Care | Payer: Commercial Managed Care - HMO | Attending: Emergency Medicine | Admitting: Emergency Medicine

## 2014-07-13 ENCOUNTER — Encounter: Payer: Self-pay | Admitting: Emergency Medicine

## 2014-07-13 DIAGNOSIS — R4781 Slurred speech: Secondary | ICD-10-CM | POA: Diagnosis not present

## 2014-07-13 DIAGNOSIS — Z720 Tobacco use: Secondary | ICD-10-CM | POA: Insufficient documentation

## 2014-07-13 DIAGNOSIS — R400 Somnolence: Secondary | ICD-10-CM | POA: Diagnosis not present

## 2014-07-13 DIAGNOSIS — F17200 Nicotine dependence, unspecified, uncomplicated: Secondary | ICD-10-CM | POA: Diagnosis not present

## 2014-07-13 DIAGNOSIS — J441 Chronic obstructive pulmonary disease with (acute) exacerbation: Secondary | ICD-10-CM | POA: Insufficient documentation

## 2014-07-13 DIAGNOSIS — L259 Unspecified contact dermatitis, unspecified cause: Secondary | ICD-10-CM | POA: Diagnosis not present

## 2014-07-13 DIAGNOSIS — J989 Respiratory disorder, unspecified: Secondary | ICD-10-CM | POA: Diagnosis not present

## 2014-07-13 DIAGNOSIS — F17210 Nicotine dependence, cigarettes, uncomplicated: Secondary | ICD-10-CM | POA: Diagnosis not present

## 2014-07-13 DIAGNOSIS — R531 Weakness: Secondary | ICD-10-CM | POA: Diagnosis not present

## 2014-07-13 DIAGNOSIS — R51 Headache: Secondary | ICD-10-CM | POA: Diagnosis not present

## 2014-07-13 DIAGNOSIS — M6282 Rhabdomyolysis: Secondary | ICD-10-CM | POA: Diagnosis not present

## 2014-07-13 DIAGNOSIS — J449 Chronic obstructive pulmonary disease, unspecified: Secondary | ICD-10-CM | POA: Diagnosis not present

## 2014-07-13 DIAGNOSIS — F3189 Other bipolar disorder: Secondary | ICD-10-CM | POA: Diagnosis not present

## 2014-07-13 DIAGNOSIS — F4310 Post-traumatic stress disorder, unspecified: Secondary | ICD-10-CM | POA: Diagnosis not present

## 2014-07-13 DIAGNOSIS — Z79899 Other long term (current) drug therapy: Secondary | ICD-10-CM | POA: Diagnosis not present

## 2014-07-13 DIAGNOSIS — M6281 Muscle weakness (generalized): Secondary | ICD-10-CM | POA: Diagnosis present

## 2014-07-13 DIAGNOSIS — M1990 Unspecified osteoarthritis, unspecified site: Secondary | ICD-10-CM | POA: Diagnosis not present

## 2014-07-13 DIAGNOSIS — F319 Bipolar disorder, unspecified: Secondary | ICD-10-CM | POA: Diagnosis not present

## 2014-07-13 DIAGNOSIS — R079 Chest pain, unspecified: Secondary | ICD-10-CM | POA: Diagnosis not present

## 2014-07-13 HISTORY — DX: Acute myocardial infarction, unspecified: I21.9

## 2014-07-13 HISTORY — DX: Cerebral infarction, unspecified: I63.9

## 2014-07-13 LAB — CBC
HCT: 38.4 % (ref 35.0–47.0)
Hemoglobin: 12.5 g/dL (ref 12.0–16.0)
MCH: 29.6 pg (ref 26.0–34.0)
MCHC: 32.6 g/dL (ref 32.0–36.0)
MCV: 91 fL (ref 80.0–100.0)
Platelets: 170 10*3/uL (ref 150–440)
RBC: 4.22 MIL/uL (ref 3.80–5.20)
RDW: 14.7 % — ABNORMAL HIGH (ref 11.5–14.5)
WBC: 7.8 10*3/uL (ref 3.6–11.0)

## 2014-07-13 LAB — BASIC METABOLIC PANEL
Anion gap: 9 (ref 5–15)
BUN: 9 mg/dL (ref 6–20)
CHLORIDE: 100 mmol/L — AB (ref 101–111)
CO2: 32 mmol/L (ref 22–32)
Calcium: 9.3 mg/dL (ref 8.9–10.3)
Creatinine, Ser: 0.75 mg/dL (ref 0.44–1.00)
GFR calc non Af Amer: >60 mL/min (ref >60)
Glucose, Bld: 87 mg/dL (ref 65–99)
POTASSIUM: 3.5 mmol/L (ref 3.5–5.1)
Sodium: 141 mmol/L (ref 135–145)

## 2014-07-13 LAB — SEDIMENTATION RATE: SED RATE: 39 mm/h — AB (ref 0–30)

## 2014-07-13 MED ORDER — PREDNISONE 20 MG PO TABS
ORAL_TABLET | ORAL | Status: AC
  Administered 2014-07-13: 60 mg via ORAL
  Filled 2014-07-13: qty 3

## 2014-07-13 MED ORDER — PREDNISONE 20 MG PO TABS: 60.0000 mg | ORAL_TABLET | Freq: Every day | ORAL | Status: DC

## 2014-07-13 MED ORDER — PREDNISONE 20 MG PO TABS
60.0000 mg | ORAL_TABLET | ORAL | Status: AC
  Administered 2014-07-13: 60 mg via ORAL

## 2014-07-13 NOTE — ED Notes (Signed)
Pt states she has been lethargic/weak since Sunday, her caregiver told ems that her xanax was increased and seems to be the source of some of the lethargy, pt states she has been cold at home and having chills the past few days, sats upon arrival in low 90's, upper 80's when pt sleeps. Appears in no distress.

## 2014-07-13 NOTE — ED Notes (Signed)
Pt requested food, box lunch given-Dr Karma Greaser aware.

## 2014-07-13 NOTE — ED Notes (Signed)

## 2014-07-13 NOTE — ED Notes (Signed)
Pt states the 2 red areas on her left upper arm where blisters that popped a few days ago, pt states there was oozing from site, however, no ooze at this time. Her in home nurse took a picture of them and pt was to have a Dr appoint to check the site tomorrow.  Pt states that now she feels the blisters forming on the left side of her neck. No blisters noted, however, there are 2 small round areas that pt states are tender, directly under her left ear. Will address with Dr Karma Greaser.

## 2014-07-13 NOTE — ED Notes (Signed)
Patient attempting to call friend to pick her up at this time from hospital.
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
## 2014-07-13 NOTE — ED Notes (Signed)
Patient resting with eyes closed, respirations even and unlabored. NAD noted at this time.

## 2014-07-13 NOTE — ED Notes (Signed)
Pt placed on 2L per [place], sats dropping in the upper 80's.

## 2014-07-14 DIAGNOSIS — F17210 Nicotine dependence, cigarettes, uncomplicated: Secondary | ICD-10-CM | POA: Diagnosis not present

## 2014-07-14 DIAGNOSIS — F3189 Other bipolar disorder: Secondary | ICD-10-CM | POA: Diagnosis not present

## 2014-07-14 DIAGNOSIS — F319 Bipolar disorder, unspecified: Secondary | ICD-10-CM | POA: Diagnosis not present

## 2014-07-14 DIAGNOSIS — J449 Chronic obstructive pulmonary disease, unspecified: Secondary | ICD-10-CM | POA: Diagnosis not present

## 2014-07-14 DIAGNOSIS — F4310 Post-traumatic stress disorder, unspecified: Secondary | ICD-10-CM | POA: Diagnosis not present

## 2014-07-14 DIAGNOSIS — M1990 Unspecified osteoarthritis, unspecified site: Secondary | ICD-10-CM | POA: Diagnosis not present

## 2014-07-14 DIAGNOSIS — M6282 Rhabdomyolysis: Secondary | ICD-10-CM | POA: Diagnosis not present

## 2014-07-15 DIAGNOSIS — M6282 Rhabdomyolysis: Secondary | ICD-10-CM | POA: Diagnosis not present

## 2014-07-15 DIAGNOSIS — F4310 Post-traumatic stress disorder, unspecified: Secondary | ICD-10-CM | POA: Diagnosis not present

## 2014-07-15 DIAGNOSIS — F17210 Nicotine dependence, cigarettes, uncomplicated: Secondary | ICD-10-CM | POA: Diagnosis not present

## 2014-07-15 DIAGNOSIS — F3189 Other bipolar disorder: Secondary | ICD-10-CM | POA: Diagnosis not present

## 2014-07-15 DIAGNOSIS — F3181 Bipolar II disorder: Secondary | ICD-10-CM | POA: Diagnosis not present

## 2014-07-15 DIAGNOSIS — M1990 Unspecified osteoarthritis, unspecified site: Secondary | ICD-10-CM | POA: Diagnosis not present

## 2014-07-15 DIAGNOSIS — J449 Chronic obstructive pulmonary disease, unspecified: Secondary | ICD-10-CM | POA: Diagnosis not present

## 2014-07-15 DIAGNOSIS — F319 Bipolar disorder, unspecified: Secondary | ICD-10-CM | POA: Diagnosis not present

## 2014-07-16 DIAGNOSIS — F3189 Other bipolar disorder: Secondary | ICD-10-CM | POA: Diagnosis not present

## 2014-07-19 ENCOUNTER — Other Ambulatory Visit: Payer: Self-pay | Admitting: *Deleted

## 2014-07-19 DIAGNOSIS — G894 Chronic pain syndrome: Secondary | ICD-10-CM | POA: Diagnosis not present

## 2014-07-19 DIAGNOSIS — F3189 Other bipolar disorder: Secondary | ICD-10-CM | POA: Diagnosis not present

## 2014-07-19 DIAGNOSIS — F314 Bipolar disorder, current episode depressed, severe, without psychotic features: Secondary | ICD-10-CM | POA: Diagnosis not present

## 2014-07-19 NOTE — Patient Outreach (Signed)
RNCM placed a phone call to [facility] related to a referral received regarding this pt. Kristi Reels RN will call RNCM back to give appropriate report.   Rutherford Limerick RN, BSN  [facility] [phone])
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
## 2014-07-20 ENCOUNTER — Other Ambulatory Visit: Payer: Self-pay | Admitting: *Deleted

## 2014-07-20 DIAGNOSIS — M1990 Unspecified osteoarthritis, unspecified site: Secondary | ICD-10-CM | POA: Diagnosis not present

## 2014-07-20 DIAGNOSIS — J449 Chronic obstructive pulmonary disease, unspecified: Secondary | ICD-10-CM | POA: Diagnosis not present

## 2014-07-20 DIAGNOSIS — F4310 Post-traumatic stress disorder, unspecified: Secondary | ICD-10-CM | POA: Diagnosis not present

## 2014-07-20 DIAGNOSIS — M6282 Rhabdomyolysis: Secondary | ICD-10-CM | POA: Diagnosis not present

## 2014-07-20 DIAGNOSIS — F3189 Other bipolar disorder: Secondary | ICD-10-CM | POA: Diagnosis not present

## 2014-07-20 DIAGNOSIS — F17210 Nicotine dependence, cigarettes, uncomplicated: Secondary | ICD-10-CM | POA: Diagnosis not present

## 2014-07-20 DIAGNOSIS — F319 Bipolar disorder, unspecified: Secondary | ICD-10-CM | POA: Diagnosis not present

## 2014-07-20 NOTE — Patient Outreach (Addendum)
[place] [facility]) Care Management  [facility] Social Work  07/20/2014  Roshanna Cimino Tangen April 14, 1960 [id number]   Current Medications:  Current Outpatient Prescriptions  Medication Sig Dispense Refill  . alprazolam (XANAX) 2 MG tablet Take 2 mg by mouth 4 (four) times daily as needed for anxiety.    Marland Kitchen escitalopram (LEXAPRO) 20 MG tablet Take 20 mg by mouth daily.    . Ipratropium-Albuterol (COMBIVENT) 20-100 MCG/ACT AERS respimat Inhale 1 puff into the lungs every 6 (six) hours.    . predniSONE (DELTASONE) 20 MG tablet Take 3 tablets (60 mg total) by mouth daily. 15 tablet 0  . pregabalin (LYRICA) 150 MG capsule Take 150 mg by mouth 3 (three) times daily.    . QUEtiapine (SEROQUEL XR) 200 MG 24 hr tablet Take 200 mg by mouth every evening.    . Tiotropium Bromide-Olodaterol 2.5-2.5 MCG/ACT AERS Inhale 2 puffs into the lungs daily.     Marland Kitchen triamcinolone (KENALOG) 0.1 % paste Use as directed 1 application in the mouth or throat 3 (three) times daily.     No current facility-administered medications for this visit.    Functional Status:  In your present state of health, do you have any difficulty performing the following activities: 07/02/2014  Hearing? N  Vision? N  Difficulty concentrating or making decisions? N  Walking or climbing stairs? Y  Dressing or bathing? Y  Doing errands, shopping? Y    Fall/Depression Screening:  No flowsheet data found.  Assessment: Initial home visit to patient's home.  [facility] nurse present when this social worker arrived. Patient oriented X3, affect-euthymic.  States that she is feeling much better.  Per patient, her mobility has improved, walking with the assistance of a cain when she leaves her home.  Per patient, she has been  approved for the pain clinic in [place], start date pending, possibly next week..  Patient states that she has a Education officer, museum from the [place] that visits regularly to make sure that  her medicines are maintained appropriately.  Per patient her sister called the Department of Social Services due to suspicion of xanax and pain pill abuse.  Per patient she admitted to over use  to the point of overdose and passing out. Per patient overdosed was related to the loss of her nephew  in March, 2016. Per patient, she could not bring herself to attend the funeral.  Per patient she was found passed out on  the floor by her in home  aid after being unconscious for 2 days.  Patient attributes her improved mood to the  change in medications and realizing she has support and is not alone. She discussed being grateful that her sister made the call to the Depatment of Social Services.   Patient currently active with [facility]-outpatient behavioral health- Dr. Jimmye Norman,  last appointment  07/15/14( changed medications). She also has an in home aid coming out 5 days a week for 2 hours a day. The social worker from [place] also involved as well to provde supportive counseling.  Patient has declined need for grief counseling, referrals and emotional support provided.  Plan:   Per patient, she has no current needs as this time as she is already active with another care management program.  This social worker will contact patient in 2 weeks to continue to assess for continued social work needs.  Note routed to patient's provider office.  [name] [facility] Care Management [phone]

## 2014-07-21 DIAGNOSIS — F3189 Other bipolar disorder: Secondary | ICD-10-CM | POA: Diagnosis not present

## 2014-07-21 NOTE — Patient Instructions (Signed)
2

## 2014-07-22 ENCOUNTER — Other Ambulatory Visit: Payer: Self-pay | Admitting: *Deleted

## 2014-07-22 NOTE — Patient Outreach (Signed)
[place] [facility]) Care Management  07/22/2014  Nicole Austin Mar 07, 1960 [id number]   Phone call to patient to follow up with questions regarding her meals at home program.  Left message for a return call.  [name] [facility] Care Management [phone]

## 2014-07-23 ENCOUNTER — Other Ambulatory Visit: Payer: Self-pay | Admitting: *Deleted

## 2014-07-23 DIAGNOSIS — F4310 Post-traumatic stress disorder, unspecified: Secondary | ICD-10-CM | POA: Diagnosis not present

## 2014-07-23 DIAGNOSIS — F3189 Other bipolar disorder: Secondary | ICD-10-CM | POA: Diagnosis not present

## 2014-07-23 DIAGNOSIS — M6282 Rhabdomyolysis: Secondary | ICD-10-CM | POA: Diagnosis not present

## 2014-07-23 DIAGNOSIS — M1990 Unspecified osteoarthritis, unspecified site: Secondary | ICD-10-CM | POA: Diagnosis not present

## 2014-07-23 DIAGNOSIS — J449 Chronic obstructive pulmonary disease, unspecified: Secondary | ICD-10-CM | POA: Diagnosis not present

## 2014-07-23 DIAGNOSIS — F17210 Nicotine dependence, cigarettes, uncomplicated: Secondary | ICD-10-CM | POA: Diagnosis not present

## 2014-07-23 DIAGNOSIS — F319 Bipolar disorder, unspecified: Secondary | ICD-10-CM | POA: Diagnosis not present

## 2014-07-23 NOTE — Patient Outreach (Signed)
[facility] [facility]) Care Management  07/23/2014  Nicole Austin August 01, 1960 [id number]    Phone call to patient to follow up on post discharge meals through [facility].  Per patient, she has not received her 10 day supply.  CSW contacted [employer] meals division [phone], who stated that the meals were delivered on 07/15/14.  This information given to patient.  Contact phone number given to patient to follow up. Patient agrees to contact them regarding her post discharge meals.   [name] [facility] Care Management [phone]

## 2014-07-27 ENCOUNTER — Other Ambulatory Visit: Payer: Self-pay | Admitting: *Deleted

## 2014-07-27 DIAGNOSIS — F17210 Nicotine dependence, cigarettes, uncomplicated: Secondary | ICD-10-CM | POA: Diagnosis not present

## 2014-07-27 DIAGNOSIS — F319 Bipolar disorder, unspecified: Secondary | ICD-10-CM | POA: Diagnosis not present

## 2014-07-27 DIAGNOSIS — M1990 Unspecified osteoarthritis, unspecified site: Secondary | ICD-10-CM | POA: Diagnosis not present

## 2014-07-27 DIAGNOSIS — F3189 Other bipolar disorder: Secondary | ICD-10-CM | POA: Diagnosis not present

## 2014-07-27 DIAGNOSIS — J449 Chronic obstructive pulmonary disease, unspecified: Secondary | ICD-10-CM | POA: Diagnosis not present

## 2014-07-27 DIAGNOSIS — F4310 Post-traumatic stress disorder, unspecified: Secondary | ICD-10-CM | POA: Diagnosis not present

## 2014-07-27 DIAGNOSIS — M6282 Rhabdomyolysis: Secondary | ICD-10-CM | POA: Diagnosis not present

## 2014-07-27 NOTE — Patient Outreach (Signed)
RNCM called and spoke with pt. Pt open to having RNCM come out to do a home visit to assist with any present healthcare barriers.  Plan: Initial visit scheduled for 08/05/2014  Roth Ress RN, BSN  [facility] Care Management [phone])

## 2014-07-28 DIAGNOSIS — F3189 Other bipolar disorder: Secondary | ICD-10-CM | POA: Diagnosis not present

## 2014-07-29 ENCOUNTER — Telehealth: Payer: Self-pay | Admitting: Family Medicine

## 2014-07-29 DIAGNOSIS — F3189 Other bipolar disorder: Secondary | ICD-10-CM | POA: Diagnosis not present

## 2014-07-29 DIAGNOSIS — R079 Chest pain, unspecified: Secondary | ICD-10-CM | POA: Diagnosis not present

## 2014-07-29 NOTE — Telephone Encounter (Signed)
Faxed [employer] referral to [facility] Pain Management [phone], Auth# [id number] Start -07/14/2014 Expires - 01/10/2015

## 2014-07-30 ENCOUNTER — Emergency Department: Payer: Commercial Managed Care - HMO

## 2014-07-30 ENCOUNTER — Other Ambulatory Visit: Payer: Self-pay

## 2014-07-30 ENCOUNTER — Emergency Department
Admission: EM | Admit: 2014-07-30 | Discharge: 2014-07-30 | Disposition: A | Discharge status: Home / Self Care | Payer: Commercial Managed Care - HMO | Attending: Emergency Medicine | Admitting: Emergency Medicine

## 2014-07-30 ENCOUNTER — Encounter: Payer: Self-pay | Admitting: Emergency Medicine

## 2014-07-30 DIAGNOSIS — I252 Old myocardial infarction: Secondary | ICD-10-CM | POA: Insufficient documentation

## 2014-07-30 DIAGNOSIS — Z7952 Long term (current) use of systemic steroids: Secondary | ICD-10-CM | POA: Diagnosis not present

## 2014-07-30 DIAGNOSIS — R079 Chest pain, unspecified: Secondary | ICD-10-CM | POA: Diagnosis not present

## 2014-07-30 DIAGNOSIS — R0789 Other chest pain: Secondary | ICD-10-CM | POA: Diagnosis not present

## 2014-07-30 DIAGNOSIS — Z720 Tobacco use: Secondary | ICD-10-CM | POA: Diagnosis not present

## 2014-07-30 DIAGNOSIS — Z79899 Other long term (current) drug therapy: Secondary | ICD-10-CM | POA: Diagnosis not present

## 2014-07-30 DIAGNOSIS — J449 Chronic obstructive pulmonary disease, unspecified: Secondary | ICD-10-CM | POA: Insufficient documentation

## 2014-07-30 DIAGNOSIS — F3189 Other bipolar disorder: Secondary | ICD-10-CM | POA: Diagnosis not present

## 2014-07-30 LAB — URINE DRUG SCREEN, QUALITATIVE (ARMC ONLY)
AMPHETAMINES, UR SCREEN: NOT DETECTED
BARBITURATES, UR SCREEN: NOT DETECTED
Benzodiazepine, Ur Scrn: POSITIVE — AB
COCAINE METABOLITE, UR ~~LOC~~: NOT DETECTED
Cannabinoid 50 Ng, Ur ~~LOC~~: POSITIVE — AB
MDMA (Ecstasy)Ur Screen: NOT DETECTED
Methadone Scn, Ur: NOT DETECTED
Opiate, Ur Screen: POSITIVE — AB
Phencyclidine (PCP) Ur S: NOT DETECTED
TRICYCLIC, UR SCREEN: POSITIVE — AB

## 2014-07-30 LAB — COMPREHENSIVE METABOLIC PANEL
ALT: 55 U/L — ABNORMAL HIGH (ref 14–54)
ANION GAP: 8 (ref 5–15)
AST: 28 U/L (ref 15–41)
Albumin: 3.3 g/dL — ABNORMAL LOW (ref 3.5–5.0)
Alkaline Phosphatase: 95 U/L (ref 38–126)
BILIRUBIN TOTAL: 0.3 mg/dL (ref 0.3–1.2)
BUN: 12 mg/dL (ref 6–20)
CALCIUM: 9.4 mg/dL (ref 8.9–10.3)
CO2: 29 mmol/L (ref 22–32)
CREATININE: 0.74 mg/dL (ref 0.44–1.00)
Chloride: 102 mmol/L (ref 101–111)
GFR calc Af Amer: >60 mL/min (ref >60)
GFR calc non Af Amer: >60 mL/min (ref >60)
Glucose, Bld: 103 mg/dL — ABNORMAL HIGH (ref 65–99)
Potassium: 3.5 mmol/L (ref 3.5–5.1)
Sodium: 139 mmol/L (ref 135–145)
Total Protein: 6.3 g/dL — ABNORMAL LOW (ref 6.5–8.1)

## 2014-07-30 LAB — CBC WITH DIFFERENTIAL/PLATELET
BASOS ABS: 0.2 10*3/uL — AB (ref 0–0.1)
Basophils Relative: 2 %
EOS PCT: 6 %
Eosinophils Absolute: 0.5 10*3/uL (ref 0–0.7)
HCT: 37.3 % (ref 35.0–47.0)
HEMOGLOBIN: 12.3 g/dL (ref 12.0–16.0)
Lymphocytes Relative: 34 %
Lymphs Abs: 3 10*3/uL (ref 1.0–3.6)
MCH: 29.8 pg (ref 26.0–34.0)
MCHC: 32.9 g/dL (ref 32.0–36.0)
MCV: 90.6 fL (ref 80.0–100.0)
MONO ABS: 0.9 10*3/uL (ref 0.2–0.9)
MONOS PCT: 11 %
NEUTROS ABS: 4 10*3/uL (ref 1.4–6.5)
Neutrophils Relative %: 47 %
PLATELETS: 204 10*3/uL (ref 150–440)
RBC: 4.11 MIL/uL (ref 3.80–5.20)
RDW: 15.2 % — ABNORMAL HIGH (ref 11.5–14.5)
WBC: 8.7 10*3/uL (ref 3.6–11.0)

## 2014-07-30 LAB — TROPONIN I

## 2014-07-30 MED ORDER — ONDANSETRON HCL 4 MG/2ML IJ SOLN
4.0000 mg | Freq: Once | INTRAMUSCULAR | Status: AC
  Administered 2014-07-30: 4 mg via INTRAVENOUS

## 2014-07-30 MED ORDER — MORPHINE SULFATE 2 MG/ML IJ SOLN
2.0000 mg | Freq: Once | INTRAMUSCULAR | Status: AC
  Administered 2014-07-30: 2 mg via INTRAVENOUS

## 2014-07-30 MED ORDER — ONDANSETRON HCL 4 MG/2ML IJ SOLN
INTRAMUSCULAR | Status: AC
  Administered 2014-07-30: 4 mg via INTRAVENOUS
  Filled 2014-07-30: qty 8

## 2014-07-30 MED ORDER — MORPHINE SULFATE 2 MG/ML IJ SOLN
INTRAMUSCULAR | Status: AC
  Administered 2014-07-30: 2 mg via INTRAVENOUS
  Filled 2014-07-30: qty 1

## 2014-07-30 NOTE — Discharge Instructions (Signed)
1. Continue medications as directed by your doctor. 2. Return to the ER for worsening symptoms, persistent vomiting, difficulty breathing or other concerns.  Chest Pain (Nonspecific) It is often hard to give a specific diagnosis for the cause of chest pain. There is always a chance that your pain could be related to something serious, such as a heart attack or a blood clot in the lungs. You need to follow up with your health care provider for further evaluation. CAUSES   Heartburn.  Pneumonia or bronchitis.  Anxiety or stress.  Inflammation around your heart (pericarditis) or lung (pleuritis or pleurisy).  A blood clot in the lung.  A collapsed lung (pneumothorax). It can develop suddenly on its own (spontaneous pneumothorax) or from trauma to the chest.  Shingles infection (herpes zoster virus). The chest wall is composed of bones, muscles, and cartilage. Any of these can be the source of the pain.  The bones can be bruised by injury.  The muscles or cartilage can be strained by coughing or overwork.  The cartilage can be affected by inflammation and become sore (costochondritis). DIAGNOSIS  Lab tests or other studies may be needed to find the cause of your pain. Your health care provider may have you take a test called an ambulatory electrocardiogram (ECG). An ECG records your heartbeat patterns over a 24-hour period. You may also have other tests, such as:  Transthoracic echocardiogram (TTE). During echocardiography, sound waves are used to evaluate how blood flows through your heart.  Transesophageal echocardiogram (TEE).  Cardiac monitoring. This allows your health care provider to monitor your heart rate and rhythm in real time.  Holter monitor. This is a portable device that records your heartbeat and can help diagnose heart arrhythmias. It allows your health care provider to track your heart activity for several days, if needed.  Stress tests by exercise or by giving  medicine that makes the heart beat faster. TREATMENT   Treatment depends on what may be causing your chest pain. Treatment may include:  Acid blockers for heartburn.  Anti-inflammatory medicine.  Pain medicine for inflammatory conditions.  Antibiotics if an infection is present.  You may be advised to change lifestyle habits. This includes stopping smoking and avoiding alcohol, caffeine, and chocolate.  You may be advised to keep your head raised (elevated) when sleeping. This reduces the chance of acid going backward from your stomach into your esophagus. Most of the time, nonspecific chest pain will improve within 2-3 days with rest and mild pain medicine.  HOME CARE INSTRUCTIONS   If antibiotics were prescribed, take them as directed. Finish them even if you start to feel better.  For the next few days, avoid physical activities that bring on chest pain. Continue physical activities as directed.  Do not use any tobacco products, including cigarettes, chewing tobacco, or electronic cigarettes.  Avoid drinking alcohol.  Only take medicine as directed by your health care provider.  Follow your health care provider's suggestions for further testing if your chest pain does not go away.  Keep any follow-up appointments you made. If you do not go to an appointment, you could develop lasting (chronic) problems with pain. If there is any problem keeping an appointment, call to reschedule. SEEK MEDICAL CARE IF:   Your chest pain does not go away, even after treatment.  You have a rash with blisters on your chest.  You have a fever. SEEK IMMEDIATE MEDICAL CARE IF:   You have increased chest pain or pain that  spreads to your arm, neck, jaw, back, or abdomen.  You have shortness of breath.  You have an increasing cough, or you cough up blood.  You have severe back or abdominal pain.  You feel nauseous or vomit.  You have severe weakness.  You faint.  You have  chills. This is an emergency. Do not wait to see if the pain will go away. Get medical help at once. Call your local emergency services (911 in [place] Do not drive yourself to the hospital. MAKE SURE YOU:   Understand these instructions.  Will watch your condition.  Will get help right away if you are not doing well or get worse. Document Released: 11/22/2004 Document Revised: 02/17/2013 Document Reviewed: 09/18/2007 [facility] Patient Information 2015 [place], [place]. This information is not intended to replace advice given to you by your health care provider. Make sure you discuss any questions you have with your health care provider.

## 2014-07-30 NOTE — ED Provider Notes (Signed)
[facility] Emergency Department Provider Note  ____________________________________________  Time seen: Approximately 1:30 AM  I have reviewed the triage vital signs and the nursing notes.   HISTORY  Chief Complaint Chest Pain    HPI Nicole Austin is a 54 y.o. female who presents via EMS with complaints of left-sided chest pressure that started approximately 11 PM while washing dishes. Patient describes 8/10 left chest pain radiating to left arm. Denies associated diaphoresis, nausea/vomiting, shortness of breath. Patient has a history of a prior MI and TIA approximately 3 months prior. States current symptoms are unlike anginal symptoms. Review of records reveal patient also has a history of polysubstance abuse. Patient does not have nitroglycerin at home. Received 324 mg aspirin per EMS.No recent travel or surgery MI; no use of hormones.   Past Medical History  Diagnosis Date  . COPD (chronic obstructive pulmonary disease)   . PTSD (post-traumatic stress disorder)   . Rhabdomyolysis   . Bipolar 1 disorder   . Arthritis   . Stroke   . MI (myocardial infarction)     Patient Active Problem List   Diagnosis Date Noted  . Polysubstance abuse 07/04/2014    Past Surgical History  Procedure Laterality Date  . Replacement total knee Left     Current Outpatient Rx  Name  Route  Sig  Dispense  Refill  . albuterol (PROVENTIL HFA;VENTOLIN HFA) 108 (90 BASE) MCG/ACT inhaler   Inhalation   Inhale 2 puffs into the lungs every 6 (six) hours as needed for wheezing or shortness of breath.         . ALPRAZolam (XANAX) 0.5 MG tablet   Oral   Take 0.5 mg by mouth 3 (three) times daily as needed for anxiety.         Marland Kitchen escitalopram (LEXAPRO) 20 MG tablet   Oral   Take 20 mg by mouth daily.         . pregabalin (LYRICA) 200 MG capsule   Oral   Take 200 mg by mouth 3 (three) times daily.         . QUEtiapine (SEROQUEL XR) 200 MG 24 hr tablet   Oral   Take 200 mg by mouth every evening.         . triamcinolone (KENALOG) 0.1 % paste   Mouth/Throat   Use as directed 1 application in the mouth or throat 3 (three) times daily.         . predniSONE (DELTASONE) 20 MG tablet   Oral   Take 3 tablets (60 mg total) by mouth daily. Patient not taking: Reported on 07/30/2014   15 tablet   0     Allergies Review of patient's allergies indicates no known allergies.  History reviewed. No pertinent family history.  Social History History  Substance Use Topics  . Smoking status: Current Every Day Smoker -- 1.00 packs/day    Types: Cigarettes  . Smokeless tobacco: Not on file  . Alcohol Use: No    Review of Systems Constitutional: No fever/chills Eyes: No visual changes. ENT: No sore throat. Cardiovascular: Positive for chest pain. Respiratory: Denies shortness of breath. Gastrointestinal: No abdominal pain.  No nausea, no vomiting.  No diarrhea.  No constipation. Genitourinary: Negative for dysuria. Musculoskeletal: Negative for back pain. Skin: Negative for rash. Neurological: Negative for headaches, focal weakness or numbness.  10-point ROS otherwise negative.  ____________________________________________   PHYSICAL EXAM:  VITAL SIGNS: ED Triage Vitals  Enc Vitals Group  BP 07/30/14 0127 97/59 mmHg     Pulse Rate 07/30/14 0127 79     Resp 07/30/14 0127 13     Temp 07/30/14 0127 98.3 F (36.8 C)     Temp Source 07/30/14 0127 Oral     SpO2 07/30/14 0127 98 %     Weight 07/30/14 0127 232 lb 2.3 oz (105.3 kg)     Height 07/30/14 0127 5\' 1"  (1.549 m)     Head Cir --      Peak Flow --      Pain Score --      Pain Loc --      Pain Edu? --      Excl. in [place]? --     Constitutional: Alert and oriented. Well appearing and in no acute distress. Eyes: Conjunctivae are normal. PERRL. EOMI. Head: Atraumatic. Nose: No congestion/rhinnorhea. Mouth/Throat: Mucous membranes are moist.  Oropharynx  non-erythematous. Neck: No stridor.   Cardiovascular: Normal rate, regular rhythm. Grossly normal heart sounds.  Good peripheral circulation. Respiratory: Normal respiratory effort.  No retractions. Lungs CTAB. Gastrointestinal: Soft and nontender. No distention. No abdominal bruits. No CVA tenderness. Musculoskeletal: No lower extremity tenderness nor edema.  No joint effusions. Neurologic:  Normal speech and language. No gross focal neurologic deficits are appreciated. Speech is normal. Gait not tested. Skin:  Skin is warm, dry and intact. No rash noted. Psychiatric: Mood and affect are flat. Speech and behavior are normal.  ____________________________________________   LABS (all labs ordered are listed, but only abnormal results are displayed)  Labs Reviewed  CBC WITH DIFFERENTIAL/PLATELET - Abnormal; Notable for the following:    RDW 15.2 (*)    Basophils Absolute 0.2 (*)    All other components within normal limits  COMPREHENSIVE METABOLIC PANEL - Abnormal; Notable for the following:    Glucose, Bld 103 (*)    Total Protein 6.3 (*)    Albumin 3.3 (*)    ALT 55 (*)    All other components within normal limits  TROPONIN I  TROPONIN I  URINE DRUG SCREEN, QUALITATIVE ([facility] ONLY)   ____________________________________________  EKG  ED ECG REPORT I, Kevonte Vanecek J, the attending physician, personally viewed and interpreted this ECG.   Date: 07/30/2014  EKG Time: 0121  Rate: 84  Rhythm: normal EKG, normal sinus rhythm  Axis: Normal  Intervals:none  ST&T Change: Nonspecific  ____________________________________________  RADIOLOGY  Portable chest x-ray (viewed by me, interpreted by Dr. Radene Knee): No acute cardiopulmonary process seen. ____________________________________________   PROCEDURES  Procedure(s) performed: None  Critical Care performed: No  ____________________________________________   INITIAL IMPRESSION / ASSESSMENT AND PLAN / ED COURSE  Pertinent  labs & imaging results that were available during my care of the patient were reviewed by me and considered in my medical decision making (see chart for details).  54 year old female with complaints of left-sided chest pressure. Patient is borderline hypotensive and thus will avoid nitroglycerin. Morphine and Zofran given for pain. Will obtain labs including troponin, chest x-ray; will also obtain urine drug screen.  ----------------------------------------- 4:59 AM on 07/30/2014 -----------------------------------------  Patient sleeping in no acute distress. Denies chest pain. Will plan to draw a repeat troponin.  ----------------------------------------- 6:34 AM on 07/30/2014 -----------------------------------------  Patient resting in no acute distress. Voices no complaints. Repeat troponin negative. Discussed with patient and given strict return precautions. Patient verbalizes understanding and agrees with plan of care. ____________________________________________   FINAL CLINICAL IMPRESSION(S) / ED DIAGNOSES  Final diagnoses:  Chest pain, unspecified chest pain type  Paulette Blanch, MD 07/30/14 [phone]

## 2014-07-30 NOTE — ED Notes (Signed)
Patient presents to Emergency Department via EMS with complaints of Chest pain that started suddenly at 2300 and is left chest radiating to left arm.  Reports tightness and pressure, prior MI and TIA approx 3 months prior , Denies N/V no diaphoresis.

## 2014-08-02 DIAGNOSIS — F3189 Other bipolar disorder: Secondary | ICD-10-CM | POA: Diagnosis not present

## 2014-08-02 DIAGNOSIS — F17210 Nicotine dependence, cigarettes, uncomplicated: Secondary | ICD-10-CM | POA: Diagnosis not present

## 2014-08-02 DIAGNOSIS — F319 Bipolar disorder, unspecified: Secondary | ICD-10-CM | POA: Diagnosis not present

## 2014-08-02 DIAGNOSIS — M6282 Rhabdomyolysis: Secondary | ICD-10-CM | POA: Diagnosis not present

## 2014-08-02 DIAGNOSIS — F4310 Post-traumatic stress disorder, unspecified: Secondary | ICD-10-CM | POA: Diagnosis not present

## 2014-08-02 DIAGNOSIS — J449 Chronic obstructive pulmonary disease, unspecified: Secondary | ICD-10-CM | POA: Diagnosis not present

## 2014-08-02 DIAGNOSIS — M1990 Unspecified osteoarthritis, unspecified site: Secondary | ICD-10-CM | POA: Diagnosis not present

## 2014-08-03 DIAGNOSIS — F3189 Other bipolar disorder: Secondary | ICD-10-CM | POA: Diagnosis not present

## 2014-08-04 DIAGNOSIS — Z720 Tobacco use: Secondary | ICD-10-CM | POA: Insufficient documentation

## 2014-08-04 DIAGNOSIS — F17200 Nicotine dependence, unspecified, uncomplicated: Secondary | ICD-10-CM | POA: Insufficient documentation

## 2014-08-04 DIAGNOSIS — F39 Unspecified mood [affective] disorder: Secondary | ICD-10-CM | POA: Diagnosis not present

## 2014-08-04 DIAGNOSIS — M5440 Lumbago with sciatica, unspecified side: Secondary | ICD-10-CM | POA: Insufficient documentation

## 2014-08-04 DIAGNOSIS — F411 Generalized anxiety disorder: Secondary | ICD-10-CM | POA: Diagnosis not present

## 2014-08-04 DIAGNOSIS — F3189 Other bipolar disorder: Secondary | ICD-10-CM | POA: Diagnosis not present

## 2014-08-04 DIAGNOSIS — H538 Other visual disturbances: Secondary | ICD-10-CM | POA: Diagnosis not present

## 2014-08-04 DIAGNOSIS — R2689 Other abnormalities of gait and mobility: Secondary | ICD-10-CM | POA: Diagnosis not present

## 2014-08-05 ENCOUNTER — Other Ambulatory Visit: Payer: Self-pay | Admitting: *Deleted

## 2014-08-05 ENCOUNTER — Encounter: Payer: Self-pay | Admitting: *Deleted

## 2014-08-05 DIAGNOSIS — F3189 Other bipolar disorder: Secondary | ICD-10-CM | POA: Diagnosis not present

## 2014-08-05 NOTE — Patient Outreach (Addendum)
[place] [facility]) Care Management   08/05/2014  Nicole Austin 09/27/1960 [id number]  Nicole Austin is an 54 y.o. female  Subjective: "I am ready to get my life back and get back on the dance floor." "I know I need to lose weight I fee like that is my biggest obstacle right now." " I am doing so much better since my hospital stay and I want to keep going in the right direction, I am walking so much better, so I would like to start exercising again."  Objective: Blood pressure 110/72, pulse 89, resp. rate 18, height 1.549 m (5\' 1" ), weight 234 lb (106.142 kg), SpO2 92 %.  Review of Systems  Respiratory: Positive for shortness of breath.   Musculoskeletal: Positive for back pain, joint pain and falls.  Neurological:       Pt reports she feels shaky. Slight numbness and tingling to finger tips.  Endo/Heme/Allergies: Bruises/bleeds easily.  Psychiatric/Behavioral: Negative for depression.    Physical Exam  Constitutional: She is oriented to person, place, and time. She appears well-developed and well-nourished.  HENT:  Head: Normocephalic.  Cardiovascular: Normal rate, regular rhythm and normal heart sounds.   Pulses:      Dorsalis pedis pulses are 2+ on the right side, and 2+ on the left side.  Respiratory: Effort normal. She has decreased breath sounds in the right lower field and the left lower field. She has rhonchi in the right middle field and the left middle field.  GI: Soft. Bowel sounds are normal.  Musculoskeletal:       Left knee: Tenderness found.       Lumbar back: She exhibits pain.       Right foot: There is tenderness and swelling.       Left foot: There is tenderness and swelling.  Neurological: She is alert and oriented to person, place, and time.  Skin: Skin is warm and dry.     Psychiatric: She has a normal mood and affect. Her behavior is normal. Judgment and thought content normal. Cognition and memory are normal.  Pt noted to have  slower speech and fell asleep briefly during conversation as if it was a side effect of medication.     Current Medications:   Current Outpatient Prescriptions  Medication Sig Dispense Refill  . albuterol (PROVENTIL HFA;VENTOLIN HFA) 108 (90 BASE) MCG/ACT inhaler Inhale 2 puffs into the lungs every 6 (six) hours as needed for wheezing or shortness of breath.    . ALPRAZolam (XANAX) 0.5 MG tablet Take 0.5 mg by mouth 3 (three) times daily as needed for anxiety.    . divalproex (DEPAKOTE) 250 MG DR tablet Take 250 mg by mouth 2 (two) times daily.    Marland Kitchen escitalopram (LEXAPRO) 20 MG tablet Take 20 mg by mouth daily.    . pregabalin (LYRICA) 200 MG capsule Take 200 mg by mouth 3 (three) times daily.    . QUEtiapine (SEROQUEL XR) 200 MG 24 hr tablet Take 200 mg by mouth every evening.    . triamcinolone (KENALOG) 0.1 % paste Use as directed 1 application in the mouth or throat 3 (three) times daily.    . predniSONE (DELTASONE) 20 MG tablet Take 3 tablets (60 mg total) by mouth daily. (Patient not taking: Reported on 07/30/2014) 15 tablet 0   No current facility-administered medications for this visit.    Functional Status:   In your present state of health, do you have any difficulty performing the following activities:  08/05/2014 07/20/2014  Hearing? N -  Vision? Y -  Difficulty concentrating or making decisions? Y -  Walking or climbing stairs? Y -  Dressing or bathing? Y -  Doing errands, shopping? N -  Preparing Food and eating ? N Y  Using the Toilet? N Y  In the past six months, have you accidently leaked urine? Y N  Do you have problems with loss of bowel control? N N  Managing your Medications? N Y  Managing your Finances? N N  Housekeeping or managing your Housekeeping? Y N    Fall/Depression Screening:    PHQ 2/9 Scores 08/05/2014 07/20/2014  PHQ - 2 Score 0 0   [facility] CM Care Plan Problem One        Patient Outreach from 08/05/2014 in [place] Problem One   knowlege deficit of healthy eating as evidenced by obesity and reported food choices   Care Plan for Problem One  Active   [facility] Long Term Goal (31-90 days)  Pt would like to not be admitted to the hospital in the next 90days   [place] Term Goal Start Date  08/05/14   Interventions for Problem One Long Term Goal  Education about healthy living. disscusion of benefits of insurance, Education on benefits of exercise.   [facility] CM Short Term Goal #1 (0-30 days)  Pt will lose 8lbs in the next 30 days.    [facility] CM Short Term Goal #1 Start Date  08/05/14   Interventions for Short Term Goal #1  RNCM will print and ptovide heart healthy weight loss strategy education. RNCM discussed with pt the high risk associated with obesity and heart disease.    [facility] CM Short Term Goal #2 (0-30 days)  Pt will increase exercise to 6 days a week for 30 minutes a day for the next 30 days.   [facility] CM Short Term Goal #2 Start Date  08/05/14   Interventions for Short Term Goal #2  RNCM discussed the benefit of exercise with pt. Pt will record in [facility] calendar days exercise completed.       Assessment: General: Pt is pleasant and talkative.Alert and oriented x3. Noted to close her eyes during conversation appearing drowsy. Noted to be ambulating slowly and with slight impairment of balance using furniture at times to steady herself. Pt denies depression and relates that her past attempt to harm herself has resolved. She related that the therapy and hospitalization has helped her feel more supported at home and she is ready to get back to doing some of the things she enjoys. Pt reports new medication Depakote helpful in controlling back pain.   COPD: Lungs with ronchi noted to bilateral middle lung fields and decreased breath sounds to bilateral lower lung fields. Pt reports taking inhaler as prescribed. Pt reports she continues to smoke 1 pack of cigarettes per day. No SOB observed during conversation but pt reports SOB with exertion.  Congested cough noted. Pt reports she occasionally will cough up mucus denies any discoloration. Denies the need to sleep sitting upright. Pt attempting to use an E-cigarette to cut down on smoking. Oximetry was 92% on room air.    CAD: Pt noted to be morbidly obese. Bilateral lower extremities with 1+ edema, left foot slightly more puffy.  In discussion of diet pt seemed unaware of heart healthy choices. Pt was familiar with signs and symptoms of a heart attack and stroke. Heart sounds WNL upon auscultation.  Plan:  RNCM will find out if [facility] will transport pt to [facility] for exercise classes. RNCM will collaborate with SW to address pt's financial and equipment needs. ie: home scale, raised toilet seat, and a "reacher".  RNCM will print and go over with pt heart healthy way to lose weight. RNCM will make primary care physician aware of [facility] involvement. RNCM scheduled f/u visit with pt in 1 month.  Rutherford Limerick RN, BSN  [facility] Care Management [phone])

## 2014-08-06 DIAGNOSIS — F3189 Other bipolar disorder: Secondary | ICD-10-CM | POA: Diagnosis not present

## 2014-08-09 DIAGNOSIS — F3189 Other bipolar disorder: Secondary | ICD-10-CM | POA: Diagnosis not present

## 2014-08-10 DIAGNOSIS — F3189 Other bipolar disorder: Secondary | ICD-10-CM | POA: Diagnosis not present

## 2014-08-11 DIAGNOSIS — F3189 Other bipolar disorder: Secondary | ICD-10-CM | POA: Diagnosis not present

## 2014-08-12 ENCOUNTER — Encounter: Payer: Self-pay | Admitting: *Deleted

## 2014-08-12 DIAGNOSIS — M1990 Unspecified osteoarthritis, unspecified site: Secondary | ICD-10-CM | POA: Diagnosis not present

## 2014-08-12 DIAGNOSIS — F4310 Post-traumatic stress disorder, unspecified: Secondary | ICD-10-CM | POA: Diagnosis not present

## 2014-08-12 DIAGNOSIS — F3189 Other bipolar disorder: Secondary | ICD-10-CM | POA: Diagnosis not present

## 2014-08-12 DIAGNOSIS — M6282 Rhabdomyolysis: Secondary | ICD-10-CM | POA: Diagnosis not present

## 2014-08-12 DIAGNOSIS — F319 Bipolar disorder, unspecified: Secondary | ICD-10-CM | POA: Diagnosis not present

## 2014-08-12 DIAGNOSIS — F17210 Nicotine dependence, cigarettes, uncomplicated: Secondary | ICD-10-CM | POA: Diagnosis not present

## 2014-08-12 DIAGNOSIS — J449 Chronic obstructive pulmonary disease, unspecified: Secondary | ICD-10-CM | POA: Diagnosis not present

## 2014-08-12 NOTE — Patient Outreach (Signed)
[facility]) Care Management  08/12/2014  Nicole Austin June 02, 1960 [id number]   RNCM took printed education and a scale to pt. RNCM requested pt read information before next scheduled visit. Home health CNA at pt's home and stated she would assist pt by reminding her to read the information.   Plan: RNCM will see pt at normally scheduled home visit appt.  Rutherford Limerick RN, BSN  [facility] Care Management [phone])

## 2014-08-13 ENCOUNTER — Encounter: Payer: Self-pay | Admitting: Psychiatry

## 2014-08-13 ENCOUNTER — Ambulatory Visit (INDEPENDENT_AMBULATORY_CARE_PROVIDER_SITE_OTHER): Payer: Commercial Managed Care - HMO | Admitting: Psychiatry

## 2014-08-13 VITALS — BP 129/95 | HR 93 | Resp 16 | Ht 61.0 in

## 2014-08-13 DIAGNOSIS — F3176 Bipolar disorder, in full remission, most recent episode depressed: Secondary | ICD-10-CM | POA: Diagnosis not present

## 2014-08-13 DIAGNOSIS — M549 Dorsalgia, unspecified: Secondary | ICD-10-CM | POA: Insufficient documentation

## 2014-08-13 DIAGNOSIS — F319 Bipolar disorder, unspecified: Secondary | ICD-10-CM | POA: Insufficient documentation

## 2014-08-13 DIAGNOSIS — F4310 Post-traumatic stress disorder, unspecified: Secondary | ICD-10-CM

## 2014-08-13 DIAGNOSIS — F3189 Other bipolar disorder: Secondary | ICD-10-CM | POA: Diagnosis not present

## 2014-08-13 DIAGNOSIS — G8929 Other chronic pain: Secondary | ICD-10-CM | POA: Insufficient documentation

## 2014-08-13 MED ORDER — ESCITALOPRAM OXALATE 20 MG PO TABS: 20.0000 mg | ORAL_TABLET | Freq: Every day | ORAL | Status: DC

## 2014-08-13 MED ORDER — ALPRAZOLAM 0.5 MG PO TABS: 0.5000 mg | ORAL_TABLET | Freq: Three times a day (TID) | ORAL | Status: DC | PRN

## 2014-08-13 MED ORDER — QUETIAPINE FUMARATE ER 200 MG PO TB24: 200.0000 mg | ORAL_TABLET | Freq: Every evening | ORAL | Status: DC

## 2014-08-13 NOTE — Progress Notes (Signed)
BH MD/PA/NP OP Progress Note  08/13/2014 11:41 AM Nicole Austin  MRN:  [id number]  Subjective:  Patient reports that her mood is good. She states that in hindsight she realizes that her alprazolam dose was too high and she feels she's had more energy, more interest in things and enjoying things more since being on the reduced dose of alprazolam.  So of note she has prescription for Depakote 250 mg twice a day from, Dr. Melrose Nakayama, a neurologist. I discussed with her whether he was using it to treat a neurologic disorder versus for mood. Patient indicates his for mood. However I've instructed her to speak with him about the management of this medication.  She does have some lipid studies from May of this year which indicated total cholesterol of 163 with a slightly low HDL of 40. Her LDL is slightly elevated at 108. He has a hemoglobin A1c from May 2016 which is 5.5. I will send her for a prolactin level and, Depakote level and LFTs.  He feels good on the current regimen and denies any side effects. Chief Complaint:  Chief Complaint    Other     Visit Diagnosis:     ICD-9-CM ICD-10-CM   1. Bipolar disorder, in full remission, most recent episode depressed 296.56 F31.76 escitalopram (LEXAPRO) 20 MG tablet     QUEtiapine (SEROQUEL XR) 200 MG 24 hr tablet  2. PTSD (post-traumatic stress disorder) 309.81 F43.10 ALPRAZolam (XANAX) 0.5 MG tablet     escitalopram (LEXAPRO) 20 MG tablet    Past Medical History:  Past Medical History  Diagnosis Date  . COPD (chronic obstructive pulmonary disease)   . PTSD (post-traumatic stress disorder)   . Rhabdomyolysis   . Bipolar 1 disorder   . Arthritis   . Stroke   . MI (myocardial infarction)   . Depression     Past Surgical History  Procedure Laterality Date  . Replacement total knee Left    Family History: No family history on file. Social History:  History   Social History  . Marital Status: Divorced    Spouse Name: N/A  . Number of  Children: N/A  . Years of Education: N/A   Social History Main Topics  . Smoking status: Current Every Day Smoker -- 1.00 packs/day    Types: Cigarettes  . Smokeless tobacco: Not on file  . Alcohol Use: No  . Drug Use: No  . Sexual Activity: No   Other Topics Concern  . None   Social History Narrative   Additional History:   Assessment:   Musculoskeletal: Strength & Muscle Tone: within normal limits Gait & Station: normal Patient leans: N/A  Psychiatric Specialty Exam: HPI  Review of Systems  Psychiatric/Behavioral: Negative for depression, suicidal ideas, hallucinations, memory loss and substance abuse. The patient is not nervous/anxious and does not have insomnia.     Blood pressure 129/95, pulse 93, resp. rate 16, height 5\' 1"  (1.549 m).There is no weight on file to calculate BMI.  General Appearance: Well Groomed  Eye Contact:  Good  Speech:  Clear and Coherent and Normal Rate  Volume:  Normal  Mood:  Good  Affect:  Appropriate and Full Range  Thought Process:  Linear  Orientation:  Full (Time, Place, and Person)  Thought Content:  Negative  Suicidal Thoughts:  No  Homicidal Thoughts:  No  Memory:  Immediate;   Good Recent;   Good Remote;   Good  Judgement:  Good  Insight:  Good  Psychomotor  Activity:  Negative  Concentration:  Good  Recall:  Good  Fund of Knowledge: Good  Language: Good  Akathisia:  Negative  Handed:  Right unknown  AIMS (if indicated):  Not done  Assets:  Communication Skills Housing Social Support  ADL's:  Intact  Cognition: WNL  Sleep:  Good   Is the patient at risk to self?  No. Has the patient been a risk to self in the past 6 months?  No. Has the patient been a risk to self within the distant past?  Yes.   Is the patient a risk to others?  No. Has the patient been a risk to others in the past 6 months?  No. Has the patient been a risk to others within the distant past?  No.  Current Medications: Current Outpatient  Prescriptions  Medication Sig Dispense Refill  . albuterol (PROVENTIL HFA;VENTOLIN HFA) 108 (90 BASE) MCG/ACT inhaler Inhale 2 puffs into the lungs every 6 (six) hours as needed for wheezing or shortness of breath.    . ALPRAZolam (XANAX) 0.5 MG tablet Take 1 tablet (0.5 mg total) by mouth 3 (three) times daily as needed for anxiety. 90 tablet 1  . alprazolam (XANAX) 2 MG tablet Take by mouth.    . divalproex (DEPAKOTE) 250 MG DR tablet Take 250 mg by mouth 2 (two) times daily.    . divalproex (DEPAKOTE) 250 MG DR tablet Take by mouth.    . escitalopram (LEXAPRO) 20 MG tablet Take 1 tablet (20 mg total) by mouth daily. 30 tablet 2  . escitalopram (LEXAPRO) 20 MG tablet Take by mouth.    . pregabalin (LYRICA) 200 MG capsule Take 200 mg by mouth 3 (three) times daily.    . QUEtiapine (SEROQUEL XR) 200 MG 24 hr tablet Take 1 tablet (200 mg total) by mouth every evening. 20 tablet 2  . QUEtiapine (SEROQUEL XR) 200 MG 24 hr tablet Take by mouth.    . triamcinolone (KENALOG) 0.1 % paste Use as directed 1 application in the mouth or throat 3 (three) times daily.    Marland Kitchen triamcinolone (KENALOG) 0.1 % paste     . albuterol (PROAIR HFA) 108 (90 BASE) MCG/ACT inhaler Inhale into the lungs.    . predniSONE (DELTASONE) 20 MG tablet Take 3 tablets (60 mg total) by mouth daily. (Patient not taking: Reported on 07/30/2014) 15 tablet 0  . pregabalin (LYRICA) 200 MG capsule Take by mouth.     No current facility-administered medications for this visit.    Medical Decision Making:  Established Problem, Stable/Improving (1) and Review or order clinical lab tests (1)  Treatment Plan Summary:Medication management We will continue her Seroquel XR 200 mg at bedtime. We'll continue her alprazolam 0.5 mg 3 times a day as needed., Continue her Lexapro 20 mg a day. She is on Depakote 250 mg twice a day prescribed by her neurologist. As discussed above she can discuss with her neurologist whether he will be managing this for  neurological purposes or would prefer that this writer handle that medication. Patient will follow up in 2 months. She's been encouraged calling questions or concerns prior to her next appointment. I've given her laboratory orders to have her Depakote, liver function and prolactin assess.  Faith Rogue 08/13/2014, 11:41 AM

## 2014-08-16 ENCOUNTER — Other Ambulatory Visit: Payer: Self-pay

## 2014-08-16 DIAGNOSIS — M62838 Other muscle spasm: Secondary | ICD-10-CM | POA: Insufficient documentation

## 2014-08-16 DIAGNOSIS — E66813 Obesity, class 3: Secondary | ICD-10-CM | POA: Insufficient documentation

## 2014-08-16 DIAGNOSIS — F3189 Other bipolar disorder: Secondary | ICD-10-CM | POA: Diagnosis not present

## 2014-08-16 DIAGNOSIS — R252 Cramp and spasm: Secondary | ICD-10-CM | POA: Insufficient documentation

## 2014-08-16 DIAGNOSIS — E668 Other obesity: Secondary | ICD-10-CM | POA: Insufficient documentation

## 2014-08-16 DIAGNOSIS — F319 Bipolar disorder, unspecified: Secondary | ICD-10-CM | POA: Insufficient documentation

## 2014-08-16 DIAGNOSIS — Z8719 Personal history of other diseases of the digestive system: Secondary | ICD-10-CM | POA: Insufficient documentation

## 2014-08-16 DIAGNOSIS — M109 Gout, unspecified: Secondary | ICD-10-CM

## 2014-08-16 DIAGNOSIS — F419 Anxiety disorder, unspecified: Secondary | ICD-10-CM | POA: Insufficient documentation

## 2014-08-16 HISTORY — DX: Gout, unspecified: M10.9

## 2014-08-17 DIAGNOSIS — F3189 Other bipolar disorder: Secondary | ICD-10-CM | POA: Diagnosis not present

## 2014-08-18 ENCOUNTER — Telehealth: Payer: Self-pay | Admitting: Anesthesiology

## 2014-08-18 DIAGNOSIS — F3189 Other bipolar disorder: Secondary | ICD-10-CM | POA: Diagnosis not present

## 2014-08-18 NOTE — Telephone Encounter (Signed)
left vm for pt to call to sch apt with dr Maureen Chatters , pt papers in parriss box

## 2014-08-19 DIAGNOSIS — F3189 Other bipolar disorder: Secondary | ICD-10-CM | POA: Diagnosis not present

## 2014-08-20 DIAGNOSIS — F3189 Other bipolar disorder: Secondary | ICD-10-CM | POA: Diagnosis not present

## 2014-08-23 DIAGNOSIS — F3189 Other bipolar disorder: Secondary | ICD-10-CM | POA: Diagnosis not present

## 2014-08-24 DIAGNOSIS — F3189 Other bipolar disorder: Secondary | ICD-10-CM | POA: Diagnosis not present

## 2014-08-25 ENCOUNTER — Encounter: Payer: Self-pay | Admitting: Anesthesiology

## 2014-08-25 ENCOUNTER — Ambulatory Visit: Payer: Commercial Managed Care - HMO | Attending: Anesthesiology | Admitting: Anesthesiology

## 2014-08-25 VITALS — BP 113/55 | HR 85 | Temp 97.8°F | Resp 20 | Ht 61.0 in | Wt 223.0 lb

## 2014-08-25 DIAGNOSIS — I259 Chronic ischemic heart disease, unspecified: Secondary | ICD-10-CM

## 2014-08-25 DIAGNOSIS — Z96652 Presence of left artificial knee joint: Secondary | ICD-10-CM | POA: Diagnosis not present

## 2014-08-25 DIAGNOSIS — E669 Obesity, unspecified: Secondary | ICD-10-CM

## 2014-08-25 DIAGNOSIS — F319 Bipolar disorder, unspecified: Secondary | ICD-10-CM | POA: Diagnosis not present

## 2014-08-25 DIAGNOSIS — M5136 Other intervertebral disc degeneration, lumbar region: Secondary | ICD-10-CM | POA: Diagnosis not present

## 2014-08-25 DIAGNOSIS — I639 Cerebral infarction, unspecified: Secondary | ICD-10-CM

## 2014-08-25 DIAGNOSIS — M5441 Lumbago with sciatica, right side: Secondary | ICD-10-CM | POA: Diagnosis not present

## 2014-08-25 DIAGNOSIS — M545 Low back pain: Secondary | ICD-10-CM | POA: Insufficient documentation

## 2014-08-25 DIAGNOSIS — M1712 Unilateral primary osteoarthritis, left knee: Secondary | ICD-10-CM

## 2014-08-25 DIAGNOSIS — F3189 Other bipolar disorder: Secondary | ICD-10-CM | POA: Diagnosis not present

## 2014-08-25 DIAGNOSIS — F17210 Nicotine dependence, cigarettes, uncomplicated: Secondary | ICD-10-CM | POA: Diagnosis not present

## 2014-08-25 DIAGNOSIS — F329 Major depressive disorder, single episode, unspecified: Secondary | ICD-10-CM | POA: Diagnosis not present

## 2014-08-25 DIAGNOSIS — Z8673 Personal history of transient ischemic attack (TIA), and cerebral infarction without residual deficits: Secondary | ICD-10-CM | POA: Diagnosis not present

## 2014-08-25 DIAGNOSIS — M797 Fibromyalgia: Secondary | ICD-10-CM | POA: Insufficient documentation

## 2014-08-25 DIAGNOSIS — I252 Old myocardial infarction: Secondary | ICD-10-CM | POA: Insufficient documentation

## 2014-08-25 DIAGNOSIS — M25561 Pain in right knee: Secondary | ICD-10-CM | POA: Diagnosis present

## 2014-08-25 DIAGNOSIS — M5137 Other intervertebral disc degeneration, lumbosacral region: Secondary | ICD-10-CM | POA: Diagnosis not present

## 2014-08-25 DIAGNOSIS — G8929 Other chronic pain: Secondary | ICD-10-CM | POA: Diagnosis not present

## 2014-08-25 DIAGNOSIS — M1288 Other specific arthropathies, not elsewhere classified, other specified site: Secondary | ICD-10-CM | POA: Insufficient documentation

## 2014-08-25 DIAGNOSIS — M47816 Spondylosis without myelopathy or radiculopathy, lumbar region: Secondary | ICD-10-CM

## 2014-08-25 DIAGNOSIS — F39 Unspecified mood [affective] disorder: Secondary | ICD-10-CM

## 2014-08-25 MED ORDER — MELOXICAM 7.5 MG PO TABS: 7.5000 mg | ORAL_TABLET | Freq: Two times a day (BID) | ORAL | Status: DC
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
## 2014-08-25 NOTE — Progress Notes (Signed)
Safety precautions to be maintained throughout the outpatient stay will include: orient to surroundings, keep bed in low position, maintain call bell within reach at all times, provide assistance with transfer out of bed and ambulation.  

## 2014-08-25 NOTE — Patient Instructions (Addendum)
Epidural Steroid Injection Patient Information  Description: The epidural space surrounds the nerves as they exit the spinal cord.  In some patients, the nerves can be compressed and inflamed by a bulging disc or a tight spinal canal (spinal stenosis).  By injecting steroids into the epidural space, we can bring irritated nerves into direct contact with a potentially helpful medication.  These steroids act directly on the irritated nerves and can reduce swelling and inflammation which often leads to decreased pain.  Epidural steroids may be injected anywhere along the spine and from the neck to the low back depending upon the location of your pain.   After numbing the skin with local anesthetic (like Novocaine), a small needle is passed into the epidural space slowly.  You may experience a sensation of pressure while this is being done.  The entire block usually last less than 10 minutes.  Conditions which may be treated by epidural steroids:   Low back and leg pain  Neck and arm pain  Spinal stenosis  Post-laminectomy syndrome  Herpes zoster (shingles) pain  Pain from compression fractures  Preparation for the injection:  1. Do not eat any solid food or dairy products within 6 hours of your appointment.  2. You may drink clear liquids up to 2 hours before appointment.  Clear liquids include water, black coffee, juice or soda.  No milk or cream please. 3. You may take your regular medication, including pain medications, with a sip of water before your appointment  Diabetics should hold regular insulin (if taken separately) and take 1/2 normal NPH dos the morning of the procedure.  Carry some sugar containing items with you to your appointment. 4. A driver must accompany you and be prepared to drive you home after your procedure.  5. Bring all your current medications with your. 6. An IV may be inserted and sedation may be given at the discretion of the physician.   7. A blood pressure  cuff, EKG and other monitors will often be applied during the procedure.  Some patients may need to have extra oxygen administered for a short period. 8. You will be asked to provide medical information, including your allergies, prior to the procedure.  We must know immediately if you are taking blood thinners (like Coumadin/Warfarin)  Or if you are allergic to IV iodine contrast (dye). We must know if you could possible be pregnant.  Possible side-effects:  Bleeding from needle site  Infection (rare, may require surgery)  Nerve injury (rare)  Numbness & tingling (temporary)  Difficulty urinating (rare, temporary)  Spinal headache ( a headache worse with upright posture)  Light -headedness (temporary)  Pain at injection site (several days)  Decreased blood pressure (temporary)  Weakness in arm/leg (temporary)  Pressure sensation in back/neck (temporary)  Call if you experience:  Fever/chills associated with headache or increased back/neck pain.  Headache worsened by an upright position.  New onset weakness or numbness of an extremity below the injection site  Hives or difficulty breathing (go to the emergency room)  Inflammation or drainage at the infection site  Severe back/neck pain  Any new symptoms which are concerning to you  Please note:  Although the local anesthetic injected can often make your back or neck feel good for several hours after the injection, the pain will likely return.  It takes 3-7 days for steroids to work in the epidural space.  You may not notice any pain relief for at least that one week.    If effective, we will often do a series of three injections spaced 3-6 weeks apart to maximally decrease your pain.  After the initial series, we generally will wait several months before considering a repeat injection of the same type.  If you have any questions, please call [phone] [facility]   Meloxicam  script sent to your pharmacy     Pt patient was told that she would have the caudal epidural steroid injection done for her on Friday, July 7.  She was also told that we would begin smoking cessation counseling and physical physical therapy and diet consultation at the next visit

## 2014-08-26 DIAGNOSIS — F3189 Other bipolar disorder: Secondary | ICD-10-CM | POA: Diagnosis not present

## 2014-08-27 DIAGNOSIS — F3189 Other bipolar disorder: Secondary | ICD-10-CM | POA: Diagnosis not present

## 2014-08-30 DIAGNOSIS — F3189 Other bipolar disorder: Secondary | ICD-10-CM | POA: Diagnosis not present

## 2014-08-31 DIAGNOSIS — F3189 Other bipolar disorder: Secondary | ICD-10-CM | POA: Diagnosis not present

## 2014-09-01 DIAGNOSIS — F3189 Other bipolar disorder: Secondary | ICD-10-CM | POA: Diagnosis not present

## 2014-09-02 ENCOUNTER — Other Ambulatory Visit: Payer: Self-pay | Admitting: *Deleted

## 2014-09-02 NOTE — Patient Outreach (Signed)
RNCM received a return phone call from pt. Pt stated she had been staying with her sister and plans to be home on Monday of next week. RNCM talked with pt about coming to visit. Pt stated she would call RNCM when she got home to schedule a visit or RNCM could come to see her in [place] at her sisters. RNCM discussed with pt the possibility of obtaining a raised toilet seat and a reacher to assist with decreased falls and chronic back pain. Pt was was extremely thankful and stated these items would be so helpful to her.   Plan: RNCM will reschedule home visit for next week.  RNCM will reach out to SW to obtain these items.  Rutherford Limerick RN, BSN  [facility] Care Management [phone])

## 2014-09-02 NOTE — Patient Outreach (Signed)
[facility]) Care Management  09/02/2014  Cherlyn Syring Dolph 1960-07-20 [id number]   RNCM arrived at pt's home and pt was not home. RNCM asked pt's given contact, which is pt's neighbor(Nicole Austin) if she was aware of pt's whereabouts. Nicole Austin stated "Nicole Austin is staying at her sisters house, but I can let her know you came by." RNCM left a note on pt's door letting her know I had stopped by.   Plan: Will attempt to contact pt next week if she does not call RNCM.   Rutherford Limerick RN, BSN  [facility] Care Management [phone])

## 2014-09-03 ENCOUNTER — Ambulatory Visit: Payer: Commercial Managed Care - HMO | Admitting: *Deleted

## 2014-09-03 ENCOUNTER — Ambulatory Visit: Payer: Commercial Managed Care - HMO | Admitting: Anesthesiology

## 2014-09-07 ENCOUNTER — Other Ambulatory Visit: Payer: Self-pay | Admitting: *Deleted

## 2014-09-07 DIAGNOSIS — F3189 Other bipolar disorder: Secondary | ICD-10-CM | POA: Diagnosis not present

## 2014-09-07 NOTE — Patient Outreach (Signed)
RNCM received a call from pt to reschedule previous appointment. Pt stated she was planning to have spinal epidural on Friday. She stated she would be taken care of by her friend Levada Dy. RNCM will see pt on Thursday.  Plan: RNCM will see pt on Thursday.

## 2014-09-08 ENCOUNTER — Other Ambulatory Visit: Payer: Self-pay | Admitting: *Deleted

## 2014-09-08 DIAGNOSIS — F3189 Other bipolar disorder: Secondary | ICD-10-CM | POA: Diagnosis not present

## 2014-09-08 NOTE — Patient Outreach (Signed)
[facility]) Care Management  09/08/2014  [place] [birth year] [id number]   Care coordination phone call to patient to assess needs for raised toilet seat due to high risk for falls.  Voice mail message left for a return call.    [name] [facility] Care Management [phone]

## 2014-09-09 ENCOUNTER — Other Ambulatory Visit: Payer: Self-pay | Admitting: *Deleted

## 2014-09-09 DIAGNOSIS — F3189 Other bipolar disorder: Secondary | ICD-10-CM | POA: Diagnosis not present

## 2014-09-09 DIAGNOSIS — M25562 Pain in left knee: Secondary | ICD-10-CM | POA: Diagnosis not present

## 2014-09-09 DIAGNOSIS — Z96652 Presence of left artificial knee joint: Secondary | ICD-10-CM | POA: Diagnosis not present

## 2014-09-09 NOTE — Patient Outreach (Signed)
[facility]) Care Management  09/09/2014  Nicole Austin 1960/09/15 [id number]   Phone call to patient to confirm home visit for 09/14/14.  Per RNCM, patient thought that appointment was scheduled for today.  This social worker left message for a return call to confirm next home visit.    [name] [facility] Care Management [phone]

## 2014-09-09 NOTE — Patient Outreach (Signed)
RNCM called pt to inquire about pt's visit to orthopedic MD this afternoon @ 2:45pm. RNCM and pt had discussed pt's current swelling/edema to bilateral lower extremities and the need for her to mention this to the MD along with a healing blister noted to pt's first finger on her right hand. Pt stated, " I told the doctor and showed him, but he was only concerned about the site of my knee surgery a year ago. All he said about it was that it was swelling." Pt stated, " I don't know what to do?" RNCM discussed with pt s/s of HF related to pt's history of heart attack, pt stated she was having some SOB with longer distances walking but not while resting, denied chest pain, no observed weight gain(pt reports weighing daily), denies eating high sodium foods, and pt stating she feels the swelling and blister started after taking several doses of the medication Meloxicam. Pt stated she had stopped medication and stated she did make MD aware of this also. Pt scheduled to go to pain clinic tomorrow(09/10/14) for a procedure. RNCM discussed with pt to elevate legs this evening and if increased SOB or s/s of chest pain call 911. RNCM also talked with pt about informing MD tomorrow before procedure about swelling to legs and blister to finger. Pt's friend Levada Dy at her home and available for assistance if pt needed. RNCM reminded pt of the [facility] 24 hour nurse line if she had questions before the morning.   Plan: RNCM will touch base with pt on Monday to inquire about any improvement. Rutherford Limerick RN, BSN  [facility] Care Management [phone])

## 2014-09-09 NOTE — Patient Outreach (Signed)
[facility]) Care Management  09/09/2014  [name] 1961/01/10 [id number]  Phone call to patient.  Home visit scheduled for 09/14/14 at 1:00pm to address safety needs in her bathroom area.  [name] [facility] Care Management [phone]

## 2014-09-09 NOTE — Addendum Note (Signed)
Addended by: Morley Kos on: 09/09/2014 02:47 PM   Modules accepted: Level of Service

## 2014-09-09 NOTE — Patient Outreach (Addendum)
[facility]) Care Management   09/09/2014  Nicole Austin 05/06/1960 [id number]  Nicole Austin is an 54 y.o. female  Subjective: "I am having an epidural to help with my pain on Friday, my friend is going with me I would like to know more about it." " After my epidural, I really want to get back on track with my exercise and weight loss. I have already lost 8lbs."  Objective: Blood pressure 110/70, pulse 74, resp. rate 18, height 1.549 m (5\' 1" ), weight 245 lb (111.131 kg), SpO2 94 %.  Review of Systems  Cardiovascular: Positive for claudication and leg swelling.    Physical Exam  Constitutional: She is oriented to person, place, and time. She appears well-developed and well-nourished.  Cardiovascular: Normal rate and regular rhythm.   Respiratory:  Pt reporting SOB with short distance ambulation, ie: form her front door to the end of the sidewalk, approx 20 feet.  GI: Soft. Normal appearance and bowel sounds are normal.  Musculoskeletal:       Right lower leg: She exhibits swelling.       Left lower leg: She exhibits swelling.       Right foot: There is swelling.       Left foot: There is swelling.  BIlat lower extremities with 3+ edema. Not noted to be pitting, skin was tight and pt described it as very painful.   Neurological: She is alert and oriented to person, place, and time.  Skin: Skin is warm and dry.       Current Medications:   Current Outpatient Prescriptions  Medication Sig Dispense Refill  . albuterol (PROAIR HFA) 108 (90 BASE) MCG/ACT inhaler Inhale into the lungs.    . ALPRAZolam (XANAX) 0.5 MG tablet Take 1 tablet (0.5 mg total) by mouth 3 (three) times daily as needed for anxiety. 90 tablet 1  . divalproex (DEPAKOTE) 250 MG DR tablet Take 250 mg by mouth 2 (two) times daily.    . divalproex (DEPAKOTE) 250 MG DR tablet Take by mouth.    . escitalopram (LEXAPRO) 20 MG tablet Take 1 tablet (20 mg total) by mouth daily. 30 tablet 2    . escitalopram (LEXAPRO) 20 MG tablet Take by mouth daily.     Marland Kitchen LYRICA 100 MG capsule Take 200 mg by mouth 3 (three) times daily.     . Misc Natural Products (OSTEO BI-FLEX ADV TRIPLE ST) TABS Take by mouth 2 (two) times daily.    . pregabalin (LYRICA) 200 MG capsule Take 200 mg by mouth 3 (three) times daily.    . pregabalin (LYRICA) 200 MG capsule Take by mouth.    . pregabalin (LYRICA) 200 MG capsule Take 200 mg by mouth 3 (three) times daily.    . QUEtiapine (SEROQUEL XR) 200 MG 24 hr tablet Take 1 tablet (200 mg total) by mouth every evening. 20 tablet 2  . QUEtiapine (SEROQUEL XR) 200 MG 24 hr tablet Take by mouth.    . triamcinolone (KENALOG) 0.1 % paste Use as directed 1 application in the mouth or throat 3 (three) times daily.    Marland Kitchen triamcinolone (KENALOG) 0.1 % paste     . ADVAIR DISKUS 250-50 MCG/DOSE AEPB     . albuterol (PROVENTIL HFA;VENTOLIN HFA) 108 (90 BASE) MCG/ACT inhaler Inhale 2 puffs into the lungs every 6 (six) hours as needed for wheezing or shortness of breath.    . alprazolam (XANAX) 2 MG tablet Take by mouth.    Marland Kitchen  cyclobenzaprine (FLEXERIL) 10 MG tablet     . gabapentin (NEURONTIN) 800 MG tablet     . LYRICA 150 MG capsule     . meloxicam (MOBIC) 7.5 MG tablet Take 1 tablet (7.5 mg total) by mouth 2 (two) times daily after a meal. Take one tablet after breakfast and after dinner. (Patient not taking: Reported on 09/09/2014) 42 tablet 1  . oxyCODONE-acetaminophen (PERCOCET/ROXICET) 5-325 MG per tablet     . predniSONE (DELTASONE) 10 MG tablet     . predniSONE (DELTASONE) 20 MG tablet Take 3 tablets (60 mg total) by mouth daily. (Patient not taking: Reported on 07/30/2014) 15 tablet 0   No current facility-administered medications for this visit.    Functional Status:   In your present state of health, do you have any difficulty performing the following activities: 08/05/2014 07/20/2014  Hearing? N -  Vision? Y -  Difficulty concentrating or making decisions? Y -   Walking or climbing stairs? Y -  Dressing or bathing? Y -  Doing errands, shopping? N -  Preparing Food and eating ? N Y  Using the Toilet? N Y  In the past six months, have you accidently leaked urine? Y N  Do you have problems with loss of bowel control? N N  Managing your Medications? N Y  Managing your Finances? N N  Housekeeping or managing your Housekeeping? Y N    Fall/Depression Screening:    PHQ 2/9 Scores 08/25/2014 08/05/2014 07/20/2014  PHQ - 2 Score 1 0 0    Assessment: See physical exam as above. RNCM played EMMI video related to spinal epidural for pt and her friend who would be caring for her afterwards. RNCM discussed with pt the importance of her showing the blister on her finger and the swelling to her legs to her MD who she had an appt with on this afternoon and she agreed she would. RNCM and pt discussed techniques for being able to remove ring from swollen finger to reduce the risk of it cutting off circulation. Pt noted to have good support system in place with friend, sister and home health aide coming every day. RNCM requested home health aide assist pt in remembering to write down weights to track possible fluid build up related to Bilat LE swelling.    Plan: Call pt this afternoon to inquire how MD appt went. See pt in one month 8/12.  Rutherford Limerick RN, BSN  [facility] Care Management [phone])

## 2014-09-10 ENCOUNTER — Ambulatory Visit: Payer: Commercial Managed Care - HMO | Attending: Anesthesiology | Admitting: Anesthesiology

## 2014-09-10 ENCOUNTER — Encounter: Payer: Self-pay | Admitting: Anesthesiology

## 2014-09-10 VITALS — BP 100/63 | HR 86 | Temp 98.2°F | Resp 16 | Ht 61.0 in | Wt 245.0 lb

## 2014-09-10 DIAGNOSIS — G8929 Other chronic pain: Secondary | ICD-10-CM | POA: Insufficient documentation

## 2014-09-10 DIAGNOSIS — M5136 Other intervertebral disc degeneration, lumbar region: Secondary | ICD-10-CM | POA: Diagnosis not present

## 2014-09-10 DIAGNOSIS — M545 Low back pain: Secondary | ICD-10-CM | POA: Insufficient documentation

## 2014-09-10 DIAGNOSIS — M5442 Lumbago with sciatica, left side: Secondary | ICD-10-CM | POA: Diagnosis not present

## 2014-09-10 DIAGNOSIS — M47816 Spondylosis without myelopathy or radiculopathy, lumbar region: Secondary | ICD-10-CM

## 2014-09-10 DIAGNOSIS — M5441 Lumbago with sciatica, right side: Secondary | ICD-10-CM | POA: Diagnosis not present

## 2014-09-10 DIAGNOSIS — F3189 Other bipolar disorder: Secondary | ICD-10-CM | POA: Diagnosis not present

## 2014-09-10 MED ORDER — LIDOCAINE HCL (PF) 1 % IJ SOLN
INTRAMUSCULAR | Status: AC
  Administered 2014-09-10: 11:00:00
  Filled 2014-09-10: qty 5

## 2014-09-10 MED ORDER — TRIAMCINOLONE ACETONIDE 40 MG/ML IJ SUSP
INTRAMUSCULAR | Status: AC
  Administered 2014-09-10: 11:00:00
  Filled 2014-09-10: qty 2

## 2014-09-10 MED ORDER — MIDAZOLAM HCL 5 MG/5ML IJ SOLN
INTRAMUSCULAR | Status: AC
  Administered 2014-09-10: 2 mg via INTRAVENOUS
  Filled 2014-09-10: qty 5

## 2014-09-10 MED ORDER — BUPIVACAINE HCL (PF) 0.25 % IJ SOLN
INTRAMUSCULAR | Status: AC
  Administered 2014-09-10: 11:00:00
  Filled 2014-09-10: qty 30

## 2014-09-10 MED ORDER — FENTANYL CITRATE (PF) 100 MCG/2ML IJ SOLN
INTRAMUSCULAR | Status: AC
  Administered 2014-09-10: 50 ug via INTRAVENOUS
  Filled 2014-09-10: qty 2

## 2014-09-10 MED ORDER — IOPAMIDOL (ISOVUE-M 200) INJECTION 41%
INTRAMUSCULAR | Status: AC
  Administered 2014-09-10: 11:00:00
  Filled 2014-09-10: qty 10

## 2014-09-10 NOTE — Progress Notes (Signed)
Safety precautions to be maintained throughout the outpatient stay will include: orient to surroundings, keep bed in low position, maintain call bell within reach at all times, provide assistance with transfer out of bed and ambulation.  

## 2014-09-10 NOTE — Progress Notes (Signed)
   Subjective:    Patient ID: Nicole Austin, female    DOB: May 10, 1960, 54 y.o.   MRN: [id number]  HPI  Date of procedure 09/10/2014  Preoperative diagnosis  1 chronic low back pain 2 lumbar degenerative disc disease 3 lumbar facetogenic disease  Post operative diagnosis Same  Procedure planned 1 Caudal epidural steroid injection 2 epidurogram with interpretation 3 fluoroscopic guidance  Surgeon Beryle Flock M.D.  Anesthesia Intravenous sedation under my direction  Informed consent was obtained and the patient appeared to accept and understand the benefits and risks of this procedure Timeout was performed with the and the nursing staff and it was decided that this procedure was in the midline and no particular side was involved  Description of procedure Patient was taken to the procedure room and intravenous sedation was provided under my direction. After appropriate sedation the sacral coccygeal area was prepped with Betadine After adequate drip and the area between the sacral cornua was palpated and infiltrated with 3 cc of 1% lidocaine And AP view of the sacrum was visualized and a 17-gauge Tuohy needle was inserted in the midline at an angle of 45 through the sacrococcygeal membrane After making contact with the bone and the needle was withdrawn slightly and readvanced in a horizontal position today caudal epidural space  Epidurogram study 1 cc of Isovue was injected through the needle and an epidurogram was visualized in both the lateral and AP views. The spread of the contrast was observed going into the neural foramina on S1 and L5 bilaterally  Caudal epidural steroid injection Then 10 cc of 0.25% bupivacaine and 20 mg of Kenalog were injected into the caudal epidural space. The needle was removed and adequate hemostasis was established. Patient tolerated the procedure quite well vital signs were stable there were no adverse effects Patient was taken to the  recovery room in satisfactory condition dishing where she was observed for approximately 1 hour and then discharged home   we'll follow-up with this patient in the next 3 weeks   Beryle Flock M.D.   Review of Systems     Objective:   Physical Exam        Assessment & Plan:

## 2014-09-10 NOTE — Patient Instructions (Signed)
GENERAL RISKS AND COMPLICATIONS  What are the risk, side effects and possible complications? Generally speaking, most procedures are safe.  However, with any procedure there are risks, side effects, and the possibility of complications.  The risks and complications are dependent upon the sites that are lesioned, or the type of nerve block to be performed.  The closer the procedure is to the spine, the more serious the risks are.  Great care is taken when placing the radio frequency needles, block needles or lesioning probes, but sometimes complications can occur. 1. Infection: Any time there is an injection through the skin, there is a risk of infection.  This is why sterile conditions are used for these blocks.  There are four possible types of infection. 1. Localized skin infection. 2. Central Nervous System Infection-This can be in the form of Meningitis, which can be deadly. 3. Epidural Infections-This can be in the form of an epidural abscess, which can cause pressure inside of the spine, causing compression of the spinal cord with subsequent paralysis. This would require an emergency surgery to decompress, and there are no guarantees that the patient would recover from the paralysis. 4. Discitis-This is an infection of the intervertebral discs.  It occurs in about 1% of discography procedures.  It is difficult to treat and it may lead to surgery.        2. Pain: the needles have to go through skin and soft tissues, will cause soreness.       3. Damage to internal structures:  The nerves to be lesioned may be near blood vessels or    other nerves which can be potentially damaged.       4. Bleeding: Bleeding is more common if the patient is taking blood thinners such as  aspirin, Coumadin, Ticiid, Plavix, etc., or if he/she have some genetic predisposition  such as hemophilia. Bleeding into the spinal canal can cause compression of the spinal  cord with subsequent paralysis.  This would require an  emergency surgery to  decompress and there are no guarantees that the patient would recover from the  paralysis.       5. Pneumothorax:  Puncturing of a lung is a possibility, every time a needle is introduced in  the area of the chest or upper back.  Pneumothorax refers to free air around the  collapsed lung(s), inside of the thoracic cavity (chest cavity).  Another two possible  complications related to a similar event would include: Hemothorax and Chylothorax.   These are variations of the Pneumothorax, where instead of air around the collapsed  lung(s), you may have blood or chyle, respectively.       6. Spinal headaches: They may occur with any procedures in the area of the spine.       7. Persistent CSF (Cerebro-Spinal Fluid) leakage: This is a rare problem, but may occur  with prolonged intrathecal or epidural catheters either due to the formation of a fistulous  track or a dural tear.       8. Nerve damage: By working so close to the spinal cord, there is always a possibility of  nerve damage, which could be as serious as a permanent spinal cord injury with  paralysis.       9. Death:  Although rare, severe deadly allergic reactions known as "Anaphylactic  reaction" can occur to any of the medications used.      10. Worsening of the symptoms:  We can always make thing worse.    What are the chances of something like this happening? Chances of any of this occuring are extremely low.  By statistics, you have more of a chance of getting killed in a motor vehicle accident: while driving to the hospital than any of the above occurring .  Nevertheless, you should be aware that they are possibilities.  In general, it is similar to taking a shower.  Everybody knows that you can slip, hit your head and get killed.  Does that mean that you should not shower again?  Nevertheless always keep in mind that statistics do not mean anything if you happen to be on the wrong side of them.  Even if a procedure has a 1  (one) in a 1,000,000 (million) chance of going wrong, it you happen to be that one..Also, keep in mind that by statistics, you have more of a chance of having something go wrong when taking medications.  Who should not have this procedure? If you are on a blood thinning medication (e.g. Coumadin, Plavix, see list of "Blood Thinners"), or if you have an active infection going on, you should not have the procedure.  If you are taking any blood thinners, please inform your physician.  How should I prepare for this procedure?  Do not eat or drink anything at least six hours prior to the procedure.  Bring a driver with you .  It cannot be a taxi.  Come accompanied by an adult that can drive you back, and that is strong enough to help you if your legs get weak or numb from the local anesthetic.  Take all of your medicines the morning of the procedure with just enough water to swallow them.  If you have diabetes, make sure that you are scheduled to have your procedure done first thing in the morning, whenever possible.  If you have diabetes, take only half of your insulin dose and notify our nurse that you have done so as soon as you arrive at the clinic.  If you are diabetic, but only take blood sugar pills (oral hypoglycemic), then do not take them on the morning of your procedure.  You may take them after you have had the procedure.  Do not take aspirin or any aspirin-containing medications, at least eleven (11) days prior to the procedure.  They may prolong bleeding.  Wear loose fitting clothing that may be easy to take off and that you would not mind if it got stained with Betadine or blood.  Do not wear any jewelry or perfume  Remove any nail coloring.  It will interfere with some of our monitoring equipment.  NOTE: Remember that this is not meant to be interpreted as a complete list of all possible complications.  Unforeseen problems may occur.  BLOOD THINNERS The following drugs  contain aspirin or other products, which can cause increased bleeding during surgery and should not be taken for 2 weeks prior to and 1 week after surgery.  If you should need take something for relief of minor pain, you may take acetaminophen which is found in Tylenol,m Datril, Anacin-3 and Panadol. It is not blood thinner. The products listed below are.  Do not take any of the products listed below in addition to any listed on your instruction sheet.  A.P.C or A.P.C with Codeine Codeine Phosphate Capsules #3 Ibuprofen Ridaura  ABC compound Congesprin Imuran rimadil  Advil Cope Indocin Robaxisal  Alka-Seltzer Effervescent Pain Reliever and Antacid Coricidin or Coricidin-D  Indomethacin Rufen    Alka-Seltzer plus Cold Medicine Cosprin Ketoprofen S-A-C Tablets  Anacin Analgesic Tablets or Capsules Coumadin Korlgesic Salflex  Anacin Extra Strength Analgesic tablets or capsules CP-2 Tablets Lanoril Salicylate  Anaprox Cuprimine Capsules Levenox Salocol  Anexsia-D Dalteparin Magan Salsalate  Anodynos Darvon compound Magnesium Salicylate Sine-off  Ansaid Dasin Capsules Magsal Sodium Salicylate  Anturane Depen Capsules Marnal Soma  APF Arthritis pain formula Dewitt's Pills Measurin Stanback  Argesic Dia-Gesic Meclofenamic Sulfinpyrazone  Arthritis Bayer Timed Release Aspirin Diclofenac Meclomen Sulindac  Arthritis pain formula Anacin Dicumarol Medipren Supac  Analgesic (Safety coated) Arthralgen Diffunasal Mefanamic Suprofen  Arthritis Strength Bufferin Dihydrocodeine Mepro Compound Suprol  Arthropan liquid Dopirydamole Methcarbomol with Aspirin Synalgos  ASA tablets/Enseals Disalcid Micrainin Tagament  Ascriptin Doan's Midol Talwin  Ascriptin A/D Dolene Mobidin Tanderil  Ascriptin Extra Strength Dolobid Moblgesic Ticlid  Ascriptin with Codeine Doloprin or Doloprin with Codeine Momentum Tolectin  Asperbuf Duoprin Mono-gesic Trendar  Aspergum Duradyne Motrin or Motrin IB Triminicin  Aspirin  plain, buffered or enteric coated Durasal Myochrisine Trigesic  Aspirin Suppositories Easprin Nalfon Trillsate  Aspirin with Codeine Ecotrin Regular or Extra Strength Naprosyn Uracel  Atromid-S Efficin Naproxen Ursinus  Auranofin Capsules Elmiron Neocylate Vanquish  Axotal Emagrin Norgesic Verin  Azathioprine Empirin or Empirin with Codeine Normiflo Vitamin E  Azolid Emprazil Nuprin Voltaren  Bayer Aspirin plain, buffered or children's or timed BC Tablets or powders Encaprin Orgaran Warfarin Sodium  Buff-a-Comp Enoxaparin Orudis Zorpin  Buff-a-Comp with Codeine Equegesic Os-Cal-Gesic   Buffaprin Excedrin plain, buffered or Extra Strength Oxalid   Bufferin Arthritis Strength Feldene Oxphenbutazone   Bufferin plain or Extra Strength Feldene Capsules Oxycodone with Aspirin   Bufferin with Codeine Fenoprofen Fenoprofen Pabalate or Pabalate-SF   Buffets II Flogesic Panagesic   Buffinol plain or Extra Strength Florinal or Florinal with Codeine Panwarfarin   Buf-Tabs Flurbiprofen Penicillamine   Butalbital Compound Four-way cold tablets Penicillin   Butazolidin Fragmin Pepto-Bismol   Carbenicillin Geminisyn Percodan   Carna Arthritis Reliever Geopen Persantine   Carprofen Gold's salt Persistin   Chloramphenicol Goody's Phenylbutazone   Chloromycetin Haltrain Piroxlcam   Clmetidine heparin Plaquenil   Cllnoril Hyco-pap Ponstel   Clofibrate Hydroxy chloroquine Propoxyphen         Before stopping any of these medications, be sure to consult the physician who ordered them.  Some, such as Coumadin (Warfarin) are ordered to prevent or treat serious conditions such as "deep thrombosis", "pumonary embolisms", and other heart problems.  The amount of time that you may need off of the medication may also vary with the medication and the reason for which you were taking it.  If you are taking any of these medications, please make sure you notify your pain physician before you undergo any  procedures.         Pain Management Discharge Instructions  General Discharge Instructions :  If you need to reach your doctor call: Monday-Friday 8:00 am - 4:00 pm at [phone] or toll free [phone].  After clinic hours [phone] to have operator reach doctor.  Bring all of your medication bottles to all your appointments in the pain clinic.  To cancel or reschedule your appointment with Pain Management please remember to call 24 hours in advance to avoid a fee.  Refer to the educational materials which you have been given on: General Risks, I had my Procedure. Discharge Instructions, Post Sedation.  Post Procedure Instructions:  The drugs you were given will stay in your system until tomorrow, so for the next 24 hours you should   not drive, make any legal decisions or drink any alcoholic beverages.  You may eat anything you prefer, but it is better to start with liquids then soups and crackers, and gradually work up to solid foods.  Please notify your doctor immediately if you have any unusual bleeding, trouble breathing or pain that is not related to your normal pain.  Depending on the type of procedure that was done, some parts of your body may feel week and/or numb.  This usually clears up by tonight or the next day.  Walk with the use of an assistive device or accompanied by an adult for the 24 hours.  You may use ice on the affected area for the first 24 hours.  Put ice in a Ziploc bag and cover with a towel and place against area 15 minutes on 15 minutes off.  You may switch to heat after 24 hours.

## 2014-09-13 DIAGNOSIS — F3189 Other bipolar disorder: Secondary | ICD-10-CM | POA: Diagnosis not present

## 2014-09-14 ENCOUNTER — Other Ambulatory Visit: Payer: Self-pay | Admitting: *Deleted

## 2014-09-14 DIAGNOSIS — F3189 Other bipolar disorder: Secondary | ICD-10-CM | POA: Diagnosis not present

## 2014-09-14 NOTE — Patient Outreach (Signed)
[facility]) Care Management  [facility] Social Work  09/14/2014  Nicole Austin [birth year] [id number]    Current Medications:  Current Outpatient Prescriptions  Medication Sig Dispense Refill  . albuterol (PROAIR HFA) 108 (90 BASE) MCG/ACT inhaler Inhale into the lungs.    . ALPRAZolam (XANAX) 0.5 MG tablet Take 1 tablet (0.5 mg total) by mouth 3 (three) times daily as needed for anxiety. 90 tablet 1  . divalproex (DEPAKOTE) 250 MG DR tablet Take 250 mg by mouth 2 (two) times daily.    Marland Kitchen escitalopram (LEXAPRO) 20 MG tablet Take 1 tablet (20 mg total) by mouth daily. 30 tablet 2  . LYRICA 100 MG capsule Take 200 mg by mouth 3 (three) times daily.     . pregabalin (LYRICA) 200 MG capsule Take 200 mg by mouth 3 (three) times daily.    . QUEtiapine (SEROQUEL XR) 200 MG 24 hr tablet Take 1 tablet (200 mg total) by mouth every evening. 20 tablet 2  . triamcinolone (KENALOG) 0.1 % paste Use as directed 1 application in the mouth or throat 3 (three) times daily.    Marland Kitchen ADVAIR DISKUS 250-50 MCG/DOSE AEPB     . albuterol (PROVENTIL HFA;VENTOLIN HFA) 108 (90 BASE) MCG/ACT inhaler Inhale 2 puffs into the lungs every 6 (six) hours as needed for wheezing or shortness of breath.    . alprazolam (XANAX) 2 MG tablet Take by mouth.    . cyclobenzaprine (FLEXERIL) 10 MG tablet     . divalproex (DEPAKOTE) 250 MG DR tablet Take by mouth.    . escitalopram (LEXAPRO) 20 MG tablet Take by mouth daily.     Marland Kitchen gabapentin (NEURONTIN) 800 MG tablet     . LYRICA 150 MG capsule     . meloxicam (MOBIC) 7.5 MG tablet Take 1 tablet (7.5 mg total) by mouth 2 (two) times daily after a meal. Take one tablet after breakfast and after dinner. (Patient not taking: Reported on 09/09/2014) 42 tablet 1  . Misc Natural Products (OSTEO BI-FLEX ADV TRIPLE ST) TABS Take by mouth 2 (two) times daily.    Marland Kitchen oxyCODONE-acetaminophen (PERCOCET/ROXICET) 5-325 MG per tablet     . predniSONE (DELTASONE) 10 MG  tablet     . predniSONE (DELTASONE) 20 MG tablet Take 3 tablets (60 mg total) by mouth daily. (Patient not taking: Reported on 07/30/2014) 15 tablet 0  . pregabalin (LYRICA) 200 MG capsule Take 200 mg by mouth 3 (three) times daily.    . pregabalin (LYRICA) 200 MG capsule Take by mouth.    . QUEtiapine (SEROQUEL XR) 200 MG 24 hr tablet Take by mouth.    . triamcinolone (KENALOG) 0.1 % paste      No current facility-administered medications for this visit.    Functional Status:  In your present state of health, do you have any difficulty performing the following activities: 08/05/2014 07/20/2014  Hearing? N -  Vision? Y -  Difficulty concentrating or making decisions? Y -  Walking or climbing stairs? Y -  Dressing or bathing? Y -  Doing errands, shopping? N -  Preparing Food and eating ? N Y  Using the Toilet? N Y  In the past six months, have you accidently leaked urine? Y N  Do you have problems with loss of bowel control? N N  Managing your Medications? N Y  Managing your Finances? N N  Housekeeping or managing your Housekeeping? Y N    Fall/Depression  Screening:  PHQ 2/9 Scores 09/10/2014 08/25/2014 08/05/2014 07/20/2014  PHQ - 2 Score 0 1 0 0  Exception Documentation (No Data) - - -     Assessment: Discharge visit completed in patient's home.  Per patient, she continues to have her pain medications managed through the pain clinic.   Per patient, she had an epidural steroid injection on 09/10/14.  Patient reports feeling good, reports that she has lost some weight.   Patient continues to  be followed by Psychiatrist, Dr.  Faith Rogue and reports being compliant with all psychotropic medications.  The Department of Social  Services has closed her case as well as Home Health. Patient uses [facility] for her transportation needs to medical appointments.  Patient continues to have her aid coming out daily for approximately 2 hours weekly.    Raised toilet seat and  reacher provided to patient to assist with decreased falls and back pain.     Plan: Patient discussed no further social work needs.  Case to be closed to social work at this time.  Patient encouraged to contact this social worker in the future if needed.    [name] [facility] Care Management [phone]

## 2014-09-15 DIAGNOSIS — F3189 Other bipolar disorder: Secondary | ICD-10-CM | POA: Diagnosis not present

## 2014-09-16 DIAGNOSIS — F3189 Other bipolar disorder: Secondary | ICD-10-CM | POA: Diagnosis not present

## 2014-09-17 DIAGNOSIS — F3189 Other bipolar disorder: Secondary | ICD-10-CM | POA: Diagnosis not present

## 2014-09-20 ENCOUNTER — Ambulatory Visit: Payer: Self-pay | Admitting: Family Medicine

## 2014-09-20 DIAGNOSIS — F3189 Other bipolar disorder: Secondary | ICD-10-CM | POA: Diagnosis not present

## 2014-09-21 DIAGNOSIS — F3189 Other bipolar disorder: Secondary | ICD-10-CM | POA: Diagnosis not present

## 2014-09-22 DIAGNOSIS — F3189 Other bipolar disorder: Secondary | ICD-10-CM | POA: Diagnosis not present

## 2014-09-23 ENCOUNTER — Telehealth: Payer: Self-pay | Admitting: Anesthesiology

## 2014-09-23 ENCOUNTER — Encounter: Payer: Self-pay | Admitting: Family Medicine

## 2014-09-23 ENCOUNTER — Ambulatory Visit (INDEPENDENT_AMBULATORY_CARE_PROVIDER_SITE_OTHER): Payer: Commercial Managed Care - HMO | Admitting: Family Medicine

## 2014-09-23 VITALS — BP 118/80 | HR 88 | Temp 98.2°F | Resp 18 | Ht 61.0 in | Wt 225.2 lb

## 2014-09-23 DIAGNOSIS — M62838 Other muscle spasm: Secondary | ICD-10-CM | POA: Diagnosis not present

## 2014-09-23 DIAGNOSIS — T23029A Burn of unspecified degree of unspecified single finger (nail) except thumb, initial encounter: Secondary | ICD-10-CM | POA: Insufficient documentation

## 2014-09-23 DIAGNOSIS — T23021S Burn of unspecified degree of single right finger (nail) except thumb, sequela: Secondary | ICD-10-CM

## 2014-09-23 DIAGNOSIS — F3189 Other bipolar disorder: Secondary | ICD-10-CM | POA: Diagnosis not present

## 2014-09-23 DIAGNOSIS — J449 Chronic obstructive pulmonary disease, unspecified: Secondary | ICD-10-CM | POA: Diagnosis not present

## 2014-09-23 MED ORDER — CYCLOBENZAPRINE HCL 5 MG PO TABS: 5.0000 mg | ORAL_TABLET | Freq: Three times a day (TID) | ORAL | Status: DC | PRN

## 2014-09-23 MED ORDER — ALBUTEROL SULFATE HFA 108 (90 BASE) MCG/ACT IN AERS: 1.0000 | INHALATION_SPRAY | Freq: Four times a day (QID) | RESPIRATORY_TRACT | Status: DC | PRN

## 2014-09-23 NOTE — Telephone Encounter (Signed)
Pt having leg cramps and numbness and cant sleep. Can we give her meds for it. Would like to talk to someone

## 2014-09-23 NOTE — Telephone Encounter (Signed)
Patient informed will discuss this with Dr. Idelia Salm tomorrow. Has appt on 09-29-14

## 2014-09-23 NOTE — Progress Notes (Signed)
Name: Nicole Austin   MRN: [id number]    DOB: Feb 08, 1961   Date:09/23/2014       Progress Note  Subjective  Chief Complaint  Chief Complaint  Patient presents with  . Blister    patient stated that she had a 3rd burn on her right index finger about 2 weeks ago.  Marland Kitchen Spasms    patient stated that she has had several episodes of cramps and muscle spasms.  . Medication Refill    patient needs a refill of her inhaler    HPI  Nicole Austin is here today to discuss recent skin changes on her right index finger. She is not sure how it happened. But she describes a burn palmar surface and over PIP knuckle which blistered then she placed Neosporin and drained the blisters and now it has healed. She still has numbness in that finger but movement intact. She continues to complain of pain and lower extremity spasms. She is followed by [facility] for long history of multi-level spinal pain, sciatica, fibromyalgia all complicated by her frequently fluctuating mood disorder. Asthma and emphysema are well controled, she needs refills today.   Patient Active Problem List   Diagnosis Date Noted  . Anxiety 08/16/2014  . Affective bipolar disorder 08/16/2014  . Gout 08/16/2014  . H/O gastrointestinal disease 08/16/2014  . Extreme obesity 08/16/2014  . Chicken pox 08/16/2014  . Bipolar 1 disorder, depressed 08/13/2014  . Back pain, chronic 08/13/2014  . Low back pain with sciatica 08/04/2014  . Compulsive tobacco user syndrome 08/04/2014  . Polysubstance abuse 07/04/2014  . Anxiety, generalized 11/24/2013  . Imbalance 11/24/2013  . Blurred vision 11/24/2013  . Cephalalgia 11/24/2013  . COPD, moderate 11/18/2013  . HPV (human papilloma virus) infection 07/17/2013  . Arthritis of knee, degenerative 07/15/2013  . H/O neoplasm 07/31/2011    History  Substance Use Topics  . Smoking status: Current Every Day Smoker -- 1.00 packs/day    Types: Cigarettes  . Smokeless tobacco: Not on  file  . Alcohol Use: No     Current outpatient prescriptions:  .  ADVAIR DISKUS 250-50 MCG/DOSE AEPB, , Disp: , Rfl:  .  albuterol (PROAIR HFA) 108 (90 BASE) MCG/ACT inhaler, Inhale into the lungs., Disp: , Rfl:  .  albuterol (PROVENTIL HFA;VENTOLIN HFA) 108 (90 BASE) MCG/ACT inhaler, Inhale 2 puffs into the lungs every 6 (six) hours as needed for wheezing or shortness of breath., Disp: , Rfl:  .  ALPRAZolam (XANAX) 0.5 MG tablet, Take 1 tablet (0.5 mg total) by mouth 3 (three) times daily as needed for anxiety., Disp: 90 tablet, Rfl: 1 .  alprazolam (XANAX) 2 MG tablet, Take by mouth., Disp: , Rfl:  .  cyclobenzaprine (FLEXERIL) 10 MG tablet, , Disp: , Rfl:  .  divalproex (DEPAKOTE) 250 MG DR tablet, Take 250 mg by mouth 2 (two) times daily., Disp: , Rfl:  .  divalproex (DEPAKOTE) 250 MG DR tablet, Take by mouth., Disp: , Rfl:  .  escitalopram (LEXAPRO) 20 MG tablet, Take 1 tablet (20 mg total) by mouth daily., Disp: 30 tablet, Rfl: 2 .  escitalopram (LEXAPRO) 20 MG tablet, Take by mouth daily. , Disp: , Rfl:  .  gabapentin (NEURONTIN) 800 MG tablet, , Disp: , Rfl:  .  GLUCOSAMINE HCL PO, Take by mouth., Disp: , Rfl:  .  LYRICA 100 MG capsule, Take 200 mg by mouth 3 (three) times daily. , Disp: , Rfl:  .  LYRICA 150  MG capsule, , Disp: , Rfl:  .  meloxicam (MOBIC) 7.5 MG tablet, Take 1 tablet (7.5 mg total) by mouth 2 (two) times daily after a meal. Take one tablet after breakfast and after dinner. (Patient not taking: Reported on 09/09/2014), Disp: 42 tablet, Rfl: 1 .  Misc Natural Products (OSTEO BI-FLEX ADV TRIPLE ST) TABS, Take by mouth 2 (two) times daily., Disp: , Rfl:  .  oxyCODONE-acetaminophen (PERCOCET/ROXICET) 5-325 MG per tablet, , Disp: , Rfl:  .  predniSONE (DELTASONE) 10 MG tablet, , Disp: , Rfl:  .  predniSONE (DELTASONE) 20 MG tablet, Take 3 tablets (60 mg total) by mouth daily. (Patient not taking: Reported on 07/30/2014), Disp: 15 tablet, Rfl: 0 .  pregabalin (LYRICA) 200 MG  capsule, Take 200 mg by mouth 3 (three) times daily., Disp: , Rfl:  .  pregabalin (LYRICA) 200 MG capsule, Take by mouth., Disp: , Rfl:  .  pregabalin (LYRICA) 200 MG capsule, Take 200 mg by mouth 3 (three) times daily., Disp: , Rfl:  .  QUEtiapine (SEROQUEL XR) 200 MG 24 hr tablet, Take 1 tablet (200 mg total) by mouth every evening., Disp: 20 tablet, Rfl: 2 .  QUEtiapine (SEROQUEL XR) 200 MG 24 hr tablet, Take by mouth., Disp: , Rfl:  .  triamcinolone (KENALOG) 0.1 % paste, Use as directed 1 application in the mouth or throat 3 (three) times daily., Disp: , Rfl:  .  triamcinolone (KENALOG) 0.1 % paste, , Disp: , Rfl:   Past Surgical History  Procedure Laterality Date  . Replacement total knee Left     Family History  Problem Relation Age of Onset  . Family history unknown: Yes    Allergies  Allergen Reactions  . Codeine Other (See Comments)    Other reaction(s): Vomiting Per pharmacist     Review of Systems  Ten systems reviewed and is negative except as mentioned in HPI.   Objective  BP 118/80 mmHg  Pulse 88  Temp(Src) 98.2 F (36.8 C) (Oral)  Resp 18  Ht 5' 1"  (1.549 m)  Wt 225 lb 3.2 oz (102.15 kg)  BMI 42.57 kg/m2  SpO2 94%  LMP  (LMP Unknown) Body mass index is 42.57 kg/(m^2).  Physical Exam  Constitutional: Patient is obese and well-nourished. In no distress. Well dressed today and happy. HEENT:  - Head: Normocephalic and atraumatic.  - Ears: Bilateral TMs gray, no erythema or effusion - Nose: Nasal mucosa moist - Mouth/Throat: Oropharynx is clear and moist. No tonsillar hypertrophy or erythema. No post nasal drainage.  - Eyes: Conjunctivae clear, EOM movements normal. PERRLA. No scleral icterus.  Neck: Normal range of motion. Neck supple. No JVD present. No thyromegaly present.  Cardiovascular: Normal rate, regular rhythm and normal heart sounds.  No murmur heard.  Pulmonary/Chest: Effort normal and breath sounds normal. No respiratory  distress. Musculoskeletal: Normal range of motion bilateral UE and LE, no joint effusions. Right hand 2nd digit hyperpigmented palmar surface well healed.  Peripheral vascular: Bilateral LE no edema. Neurological: CN II-XII grossly intact with no focal deficits. Alert and oriented to person, place, and time. Coordination, balance, strength, speech and gait are normal.  Skin: Skin is warm and dry. No rash noted. No erythema.  Psychiatric: Patient has stable mood and affect. Behavior is normal in office today. Judgment and thought content normal in office today.   Recent Results (from the past 2160 hour(s))  Protime-INR     Status: None   Collection Time: 07/02/14 12:27 PM  Result Value Ref Range   Prothrombin Time 12.6 11.4 - 15.0 seconds   INR 0.92   APTT     Status: None   Collection Time: 07/02/14 12:27 PM  Result Value Ref Range   aPTT 32 24 - 36 seconds  CBC     Status: Abnormal   Collection Time: 07/02/14 12:27 PM  Result Value Ref Range   WBC 9.3 3.6 - 11.0 K/uL   RBC 4.67 3.80 - 5.20 MIL/uL   Hemoglobin 13.9 12.0 - 16.0 g/dL   HCT 42.5 35.0 - 47.0 %   MCV 91.1 80.0 - 100.0 fL   MCH 29.8 26.0 - 34.0 pg   MCHC 32.8 32.0 - 36.0 g/dL   RDW 14.9 (H) 11.5 - 14.5 %   Platelets 214 150 - 440 K/uL  Differential     Status: None   Collection Time: 07/02/14 12:27 PM  Result Value Ref Range   Neutrophils Relative % 64 %   Neutro Abs 6.0 1.4 - 6.5 K/uL   Lymphocytes Relative 25 %   Lymphs Abs 2.3 1.0 - 3.6 K/uL   Monocytes Relative 7 %   Monocytes Absolute 0.6 0.2 - 0.9 K/uL   Eosinophils Relative 3 %   Eosinophils Absolute 0.3 0 - 0.7 K/uL   Basophils Relative 1 %   Basophils Absolute 0.1 0 - 0.1 K/uL  Comprehensive metabolic panel     Status: None   Collection Time: 07/02/14 12:27 PM  Result Value Ref Range   Sodium 139 135 - 145 mmol/L   Potassium 3.9 3.5 - 5.1 mmol/L   Chloride 104 101 - 111 mmol/L   CO2 26 22 - 32 mmol/L   Glucose, Bld 88 65 - 99 mg/dL   BUN 10 6 -  20 mg/dL   Creatinine, Ser 0.89 0.44 - 1.00 mg/dL   Calcium 9.1 8.9 - 10.3 mg/dL   Total Protein 6.8 6.5 - 8.1 g/dL   Albumin 3.8 3.5 - 5.0 g/dL   AST 25 15 - 41 U/L   ALT 17 14 - 54 U/L   Alkaline Phosphatase 85 38 - 126 U/L   Total Bilirubin 0.4 0.3 - 1.2 mg/dL   GFR calc non Af Amer >60 >60 mL/min   GFR calc Af Amer >60 >60 mL/min    Comment: (NOTE) The eGFR has been calculated using the CKD EPI equation. This calculation has not been validated in all clinical situations. eGFR's persistently <60 mL/min signify possible Chronic Kidney Disease.    Anion gap 9 5 - 15  Troponin I     Status: None   Collection Time: 07/02/14 12:43 PM  Result Value Ref Range   Troponin I <0.03 <0.031 ng/mL    Comment:        NO INDICATION OF MYOCARDIAL INJURY.   CK     Status: None   Collection Time: 07/02/14 12:43 PM  Result Value Ref Range   Total CK 220 38 - 234 U/L  Hemoglobin A1c     Status: None   Collection Time: 07/03/14  5:00 AM  Result Value Ref Range   Hgb A1c MFr Bld 5.5 4.0 - 6.0 %  Lipid panel     Status: Abnormal   Collection Time: 07/03/14  5:00 AM  Result Value Ref Range   Cholesterol 163 0 - 200 mg/dL   Triglycerides 75 <150 mg/dL   HDL 40 (L) >40 mg/dL   Total CHOL/HDL Ratio 4.1 RATIO   VLDL 15 0 -  40 mg/dL   LDL Cholesterol 108 (H) 0 - 99 mg/dL    Comment:        Total Cholesterol/HDL:CHD Risk Coronary Heart Disease Risk Table                     Men   Women  1/2 Average Risk   3.4   3.3  Average Risk       5.0   4.4  2 X Average Risk   9.6   7.1  3 X Average Risk  23.4   11.0        Use the calculated Patient Ratio above and the CHD Risk Table to determine the patient's CHD Risk.        ATP III CLASSIFICATION (LDL):  <100     mg/dL   Optimal  100-129  mg/dL   Near or Above                    Optimal  130-159  mg/dL   Borderline  160-189  mg/dL   High  >190     mg/dL   Very High   CBC     Status: Abnormal   Collection Time: 07/13/14  5:21 PM  Result  Value Ref Range   WBC 7.8 3.6 - 11.0 K/uL   RBC 4.22 3.80 - 5.20 MIL/uL   Hemoglobin 12.5 12.0 - 16.0 g/dL   HCT 38.4 35.0 - 47.0 %   MCV 91.0 80.0 - 100.0 fL   MCH 29.6 26.0 - 34.0 pg   MCHC 32.6 32.0 - 36.0 g/dL   RDW 14.7 (H) 11.5 - 14.5 %   Platelets 170 150 - 440 K/uL  Basic metabolic panel     Status: Abnormal   Collection Time: 07/13/14  5:21 PM  Result Value Ref Range   Sodium 141 135 - 145 mmol/L   Potassium 3.5 3.5 - 5.1 mmol/L   Chloride 100 (L) 101 - 111 mmol/L   CO2 32 22 - 32 mmol/L   Glucose, Bld 87 65 - 99 mg/dL   BUN 9 6 - 20 mg/dL   Creatinine, Ser 0.75 0.44 - 1.00 mg/dL   Calcium 9.3 8.9 - 10.3 mg/dL   GFR calc non Af Amer >60 >60 mL/min   GFR calc Af Amer >60 >60 mL/min    Comment: (NOTE) The eGFR has been calculated using the CKD EPI equation. This calculation has not been validated in all clinical situations. eGFR's persistently <60 mL/min signify possible Chronic Kidney Disease.    Anion gap 9 5 - 15  Sedimentation rate     Status: Abnormal   Collection Time: 07/13/14  5:21 PM  Result Value Ref Range   Sed Rate 39 (H) 0 - 30 mm/hr  Troponin I     Status: None   Collection Time: 07/30/14  1:40 AM  Result Value Ref Range   Troponin I <0.03 <0.031 ng/mL    Comment:        NO INDICATION OF MYOCARDIAL INJURY.   CBC with Differential/Platelet     Status: Abnormal   Collection Time: 07/30/14  1:40 AM  Result Value Ref Range   WBC 8.7 3.6 - 11.0 K/uL   RBC 4.11 3.80 - 5.20 MIL/uL   Hemoglobin 12.3 12.0 - 16.0 g/dL   HCT 37.3 35.0 - 47.0 %   MCV 90.6 80.0 - 100.0 fL   MCH 29.8 26.0 - 34.0 pg   MCHC 32.9 32.0 -  36.0 g/dL   RDW 15.2 (H) 11.5 - 14.5 %   Platelets 204 150 - 440 K/uL   Neutrophils Relative % 47 %   Neutro Abs 4.0 1.4 - 6.5 K/uL   Lymphocytes Relative 34 %   Lymphs Abs 3.0 1.0 - 3.6 K/uL   Monocytes Relative 11 %   Monocytes Absolute 0.9 0.2 - 0.9 K/uL   Eosinophils Relative 6 %   Eosinophils Absolute 0.5 0 - 0.7 K/uL   Basophils  Relative 2 %   Basophils Absolute 0.2 (H) 0 - 0.1 K/uL  Comprehensive metabolic panel     Status: Abnormal   Collection Time: 07/30/14  1:40 AM  Result Value Ref Range   Sodium 139 135 - 145 mmol/L   Potassium 3.5 3.5 - 5.1 mmol/L   Chloride 102 101 - 111 mmol/L   CO2 29 22 - 32 mmol/L   Glucose, Bld 103 (H) 65 - 99 mg/dL   BUN 12 6 - 20 mg/dL   Creatinine, Ser 0.74 0.44 - 1.00 mg/dL   Calcium 9.4 8.9 - 10.3 mg/dL   Total Protein 6.3 (L) 6.5 - 8.1 g/dL   Albumin 3.3 (L) 3.5 - 5.0 g/dL   AST 28 15 - 41 U/L   ALT 55 (H) 14 - 54 U/L   Alkaline Phosphatase 95 38 - 126 U/L   Total Bilirubin 0.3 0.3 - 1.2 mg/dL   GFR calc non Af Amer >60 >60 mL/min   GFR calc Af Amer >60 >60 mL/min    Comment: (NOTE) The eGFR has been calculated using the CKD EPI equation. This calculation has not been validated in all clinical situations. eGFR's persistently <60 mL/min signify possible Chronic Kidney Disease.    Anion gap 8 5 - 15  Urine Drug Screen, Qualitative ([facility] only)     Status: Abnormal   Collection Time: 07/30/14  5:16 AM  Result Value Ref Range   Tricyclic, Ur Screen POSITIVE (A) NONE DETECTED   Amphetamines, Ur Screen NONE DETECTED NONE DETECTED   MDMA (Ecstasy)Ur Screen NONE DETECTED NONE DETECTED   Cocaine Metabolite,Ur [place] NONE DETECTED NONE DETECTED   Opiate, Ur Screen POSITIVE (A) NONE DETECTED   Phencyclidine (PCP) Ur S NONE DETECTED NONE DETECTED   Cannabinoid 50 Ng, Ur [place] POSITIVE (A) NONE DETECTED   Barbiturates, Ur Screen NONE DETECTED NONE DETECTED   Benzodiazepine, Ur Scrn POSITIVE (A) NONE DETECTED   Methadone Scn, Ur NONE DETECTED NONE DETECTED    Comment: (NOTE) 073  Tricyclics, urine               Cutoff 1000 ng/mL 200  Amphetamines, urine             Cutoff 1000 ng/mL 300  MDMA (Ecstasy), urine           Cutoff 500 ng/mL 400  Cocaine Metabolite, urine       Cutoff 300 ng/mL 500  Opiate, urine                   Cutoff 300 ng/mL 600  Phencyclidine (PCP), urine       Cutoff 25 ng/mL 700  Cannabinoid, urine              Cutoff 50 ng/mL 800  Barbiturates, urine             Cutoff 200 ng/mL 900  Benzodiazepine, urine           Cutoff 200 ng/mL 1000 Methadone, urine  Cutoff 300 ng/mL 1100 1200 The urine drug screen provides only a preliminary, unconfirmed 1300 analytical test result and should not be used for non-medical 1400 purposes. Clinical consideration and professional judgment should 1500 be applied to any positive drug screen result due to possible 1600 interfering substances. A more specific alternate chemical method 1700 must be used in order to obtain a confirmed analytical result.  1800 Gas chromato graphy / mass spectrometry (GC/MS) is the preferred 1900 confirmatory method.   Troponin I     Status: None   Collection Time: 07/30/14  5:16 AM  Result Value Ref Range   Troponin I <0.03 <0.031 ng/mL    Comment:        NO INDICATION OF MYOCARDIAL INJURY.      Assessment & Plan  1. Muscle spasms of both lower extremities Muscle relaxer refilled. Discuss symptoms with pain management.   - cyclobenzaprine (FLEXERIL) 5 MG tablet; Take 1 tablet (5 mg total) by mouth 3 (three) times daily as needed for muscle spasms.  Dispense: 90 tablet; Refill: 2  2. COPD, moderate Refilled rescue inhaler, clinically stable  - albuterol (PROVENTIL HFA;VENTOLIN HFA) 108 (90 BASE) MCG/ACT inhaler; Inhale 1-2 puffs into the lungs every 6 (six) hours as needed for wheezing or shortness of breath.  Dispense: 6.7 g; Refill: 2  3. Burn of finger, right, unspecified degree, sequela Well healed.

## 2014-09-24 DIAGNOSIS — F3189 Other bipolar disorder: Secondary | ICD-10-CM | POA: Diagnosis not present

## 2014-09-24 NOTE — Telephone Encounter (Signed)
Spoke with Dr. Idelia Salm, will discuss this at appt on 09-29-14. Attempted to call patient, message left on voicemail informing her of this.

## 2014-09-27 DIAGNOSIS — F3189 Other bipolar disorder: Secondary | ICD-10-CM | POA: Diagnosis not present

## 2014-09-28 DIAGNOSIS — F3189 Other bipolar disorder: Secondary | ICD-10-CM | POA: Diagnosis not present

## 2014-09-29 ENCOUNTER — Ambulatory Visit: Payer: Commercial Managed Care - HMO | Admitting: Anesthesiology

## 2014-09-29 DIAGNOSIS — F3189 Other bipolar disorder: Secondary | ICD-10-CM | POA: Diagnosis not present

## 2014-09-30 DIAGNOSIS — F3189 Other bipolar disorder: Secondary | ICD-10-CM | POA: Diagnosis not present

## 2014-10-01 DIAGNOSIS — F3189 Other bipolar disorder: Secondary | ICD-10-CM | POA: Diagnosis not present

## 2014-10-04 ENCOUNTER — Other Ambulatory Visit: Payer: Self-pay | Admitting: Family Medicine

## 2014-10-04 DIAGNOSIS — G894 Chronic pain syndrome: Secondary | ICD-10-CM

## 2014-10-04 DIAGNOSIS — F3189 Other bipolar disorder: Secondary | ICD-10-CM | POA: Diagnosis not present

## 2014-10-04 MED ORDER — LYRICA 200 MG PO CAPS: 200.0000 mg | ORAL_CAPSULE | Freq: Two times a day (BID) | ORAL | Status: DC

## 2014-10-04 NOTE — Telephone Encounter (Signed)
Pt needs refills on Lyrica  [facility] in [place].

## 2014-10-04 NOTE — Telephone Encounter (Signed)
Refill request was sent to Dr. Ashany Sundaram for approval and submission.  

## 2014-10-05 ENCOUNTER — Other Ambulatory Visit: Payer: Self-pay | Admitting: *Deleted

## 2014-10-05 DIAGNOSIS — F3189 Other bipolar disorder: Secondary | ICD-10-CM | POA: Diagnosis not present

## 2014-10-05 NOTE — Patient Outreach (Signed)
RNCM called to reschedule pt appointment. While on the phone RNCM asked pt about leg swelling and pt's pain. Pt stating she had lost, 20lbs, her leg swelling was greatly improved and her pain was minimal compared to before epidural. Pt was in an upbeat mood and was excited to tell RNCM about the healthy food she was eating and the amount of exercise she had participating in. RNCM encouraged pt to keep up the good work.  Plan: RNCM rescheduled home visit for 8/22  Nicole Lingelbach RN, BSN  [facility] [phone])

## 2014-10-06 ENCOUNTER — Ambulatory Visit: Payer: Commercial Managed Care - HMO | Attending: Anesthesiology | Admitting: Anesthesiology

## 2014-10-06 ENCOUNTER — Encounter: Payer: Self-pay | Admitting: Anesthesiology

## 2014-10-06 VITALS — BP 132/69 | HR 106 | Temp 98.5°F | Resp 18 | Ht 61.0 in | Wt 223.0 lb

## 2014-10-06 DIAGNOSIS — M545 Low back pain, unspecified: Secondary | ICD-10-CM

## 2014-10-06 DIAGNOSIS — M5136 Other intervertebral disc degeneration, lumbar region: Secondary | ICD-10-CM | POA: Insufficient documentation

## 2014-10-06 DIAGNOSIS — F39 Unspecified mood [affective] disorder: Secondary | ICD-10-CM | POA: Diagnosis not present

## 2014-10-06 DIAGNOSIS — M5441 Lumbago with sciatica, right side: Secondary | ICD-10-CM | POA: Diagnosis not present

## 2014-10-06 DIAGNOSIS — M5416 Radiculopathy, lumbar region: Secondary | ICD-10-CM

## 2014-10-06 DIAGNOSIS — E669 Obesity, unspecified: Secondary | ICD-10-CM

## 2014-10-06 DIAGNOSIS — F3131 Bipolar disorder, current episode depressed, mild: Secondary | ICD-10-CM

## 2014-10-06 DIAGNOSIS — M5442 Lumbago with sciatica, left side: Secondary | ICD-10-CM | POA: Diagnosis not present

## 2014-10-06 DIAGNOSIS — F329 Major depressive disorder, single episode, unspecified: Secondary | ICD-10-CM | POA: Diagnosis not present

## 2014-10-06 DIAGNOSIS — I252 Old myocardial infarction: Secondary | ICD-10-CM | POA: Insufficient documentation

## 2014-10-06 DIAGNOSIS — M47816 Spondylosis without myelopathy or radiculopathy, lumbar region: Secondary | ICD-10-CM | POA: Diagnosis not present

## 2014-10-06 DIAGNOSIS — Z8673 Personal history of transient ischemic attack (TIA), and cerebral infarction without residual deficits: Secondary | ICD-10-CM | POA: Diagnosis not present

## 2014-10-06 DIAGNOSIS — G8929 Other chronic pain: Secondary | ICD-10-CM | POA: Insufficient documentation

## 2014-10-06 DIAGNOSIS — M1288 Other specific arthropathies, not elsewhere classified, other specified site: Secondary | ICD-10-CM | POA: Insufficient documentation

## 2014-10-06 DIAGNOSIS — M51369 Other intervertebral disc degeneration, lumbar region without mention of lumbar back pain or lower extremity pain: Secondary | ICD-10-CM

## 2014-10-06 DIAGNOSIS — F3189 Other bipolar disorder: Secondary | ICD-10-CM | POA: Diagnosis not present

## 2014-10-06 DIAGNOSIS — Z96652 Presence of left artificial knee joint: Secondary | ICD-10-CM | POA: Insufficient documentation

## 2014-10-06 DIAGNOSIS — F319 Bipolar disorder, unspecified: Secondary | ICD-10-CM | POA: Insufficient documentation

## 2014-10-06 DIAGNOSIS — M47817 Spondylosis without myelopathy or radiculopathy, lumbosacral region: Secondary | ICD-10-CM

## 2014-10-06 DIAGNOSIS — I639 Cerebral infarction, unspecified: Secondary | ICD-10-CM

## 2014-10-06 NOTE — Progress Notes (Signed)
Safety precautions to be maintained throughout the outpatient stay will include: orient to surroundings, keep bed in low position, maintain call bell within reach at all times, provide assistance with transfer out of bed and ambulation.  

## 2014-10-06 NOTE — Progress Notes (Signed)
Subjective:    Patient ID: Nicole Austin, female    DOB: 1960-11-29, 54 y.o.   MRN: [id number] This patient returned to the clinic today looking pretty stressed out. She had a caudal epidural steroid injection but she indicated that she got very little relief. She indicates that her pain is quite severe and continues to have unacceptable pain  Today her subjective pain intensity rating is 80 Review of her MRI shows there is multiple level severe degenerative disc disease greatest at L4 and L5. She is using medication that is being prescribed to her by her primary care physician %HPI    Review of Systems  Constitutional: Negative.   HENT: Negative.   Eyes: Negative.   Cardiovascular: Negative.   Gastrointestinal: Positive for abdominal distention.  Endocrine: Negative.   Genitourinary: Negative.   Musculoskeletal: Positive for myalgias, back pain, joint swelling, arthralgias and gait problem. Negative for neck pain and neck stiffness.       Musculoskeletal system examination shows there is some decreased range of motion especially in the lower extremities. Straight leg raising test on the left side is 30% on the right side is 45% Torsion test is also positive for facetogenic disease  Skin: Negative.   Allergic/Immunologic: Negative.   Neurological: Negative.   Hematological: Negative.   Psychiatric/Behavioral: Negative.        Objective:   Physical Exam  Constitutional: She is oriented to person, place, and time. She appears well-developed and well-nourished. No distress.  HENT:  Head: Normocephalic and atraumatic.  Right Ear: External ear normal.  Left Ear: External ear normal.  Mouth/Throat: Oropharynx is clear and moist.  Eyes: Conjunctivae and EOM are normal. Pupils are equal, round, and reactive to light. Right eye exhibits no discharge. Left eye exhibits no discharge. No scleral icterus.  Neck: Normal range of motion. Neck supple. No JVD present. No tracheal  deviation present. No thyromegaly present.  Cardiovascular: Normal rate, regular rhythm, normal heart sounds and intact distal pulses.  Exam reveals no gallop and no friction rub.   No murmur heard. Vital signs are relatively stable Blood pressure is 132/69 mmHg Pulse is 106 bpm equal and regular Respirations are 18 breaths per minute  SPO2 is 99   Pulmonary/Chest: No stridor.  Musculoskeletal: Normal range of motion. She exhibits no edema or tenderness.  Range of motion is significantly decreased Straight leg raising test on the left side was 40% Straight-leg raising test on the right side was 30% Neurological exam using light touch and pinprick showed a significant decrease in sensation on the left side Torsion test was also positive  Lymphadenopathy:    She has no cervical adenopathy.  Neurological: She is alert and oriented to person, place, and time. She has normal reflexes. She displays normal reflexes. No cranial nerve deficit. She exhibits normal muscle tone. Coordination normal.  Skin: Skin is warm and dry. She is not diaphoretic.  Psychiatric: She has a normal mood and affect. Her behavior is normal. Thought content normal.    in summary this is a pleasant delightful lady who appeared to be in some distress her subjective pain intensity rating is 85% and she continues to have low back pain Rucci radiates into both lower extremities.   review of her MRI shows significant lumbar DDD discogenic and facetogenic disease. This was mainly present at the L4-L5 and to a lesser extent the L3 level       Assessment & Plan:   Assessment 1) chronic low back  pain 2) lumbar degenerative disc disease 3) lumbar facetogenic arthropathy 4) status post left total knee replacement 5) status post bipolar disorder and depression and mood disorder 6) status post mild obesity.Marland Kitchen 7) S/P Heart Attack 8) S/P CVA   Plan of management This patient did not get much relief from an initial caudal  epidural steroid injection. A review of her lumbar MRI shows significant degenerative disc disease at L4-L5 bilaterally She is not taking any medications that would contraindicate any perispinal lesion. Therefore I will plan to perform a bilateral lumbar transforaminal epidural steroid injection at L4 and L5. We'll plan to do that next week Friday  Established patient ; level III   [place].

## 2014-10-07 DIAGNOSIS — F3189 Other bipolar disorder: Secondary | ICD-10-CM | POA: Diagnosis not present

## 2014-10-08 ENCOUNTER — Ambulatory Visit: Payer: Commercial Managed Care - HMO | Admitting: *Deleted

## 2014-10-08 DIAGNOSIS — F3189 Other bipolar disorder: Secondary | ICD-10-CM | POA: Diagnosis not present

## 2014-10-11 ENCOUNTER — Ambulatory Visit: Payer: Self-pay | Admitting: Psychiatry

## 2014-10-11 DIAGNOSIS — F3189 Other bipolar disorder: Secondary | ICD-10-CM | POA: Diagnosis not present

## 2014-10-12 ENCOUNTER — Encounter: Payer: Self-pay | Admitting: Psychiatry

## 2014-10-12 ENCOUNTER — Ambulatory Visit (INDEPENDENT_AMBULATORY_CARE_PROVIDER_SITE_OTHER): Payer: Commercial Managed Care - HMO | Admitting: Psychiatry

## 2014-10-12 VITALS — BP 128/82 | HR 112 | Temp 99.4°F | Ht 61.0 in | Wt 225.8 lb

## 2014-10-12 DIAGNOSIS — F3176 Bipolar disorder, in full remission, most recent episode depressed: Secondary | ICD-10-CM

## 2014-10-12 DIAGNOSIS — F4310 Post-traumatic stress disorder, unspecified: Secondary | ICD-10-CM

## 2014-10-12 DIAGNOSIS — F3189 Other bipolar disorder: Secondary | ICD-10-CM | POA: Diagnosis not present

## 2014-10-12 MED ORDER — QUETIAPINE FUMARATE ER 200 MG PO TB24: 200.0000 mg | ORAL_TABLET | Freq: Every evening | ORAL | Status: DC

## 2014-10-12 MED ORDER — ESCITALOPRAM OXALATE 20 MG PO TABS: 20.0000 mg | ORAL_TABLET | Freq: Every day | ORAL | Status: DC

## 2014-10-12 MED ORDER — ALPRAZOLAM 0.5 MG PO TABS: 0.5000 mg | ORAL_TABLET | Freq: Three times a day (TID) | ORAL | Status: DC | PRN

## 2014-10-12 NOTE — Progress Notes (Signed)
BH MD/PA/NP OP Progress Note  10/12/2014 3:08 PM Nicole Austin  MRN:  [id number]  Subjective:  Patient reports that her mood is good. She states that she does have several social stressors but she feels she is managing them. She states she got an eviction notice for her apartment and states that the reason was that inspectors found a roach and they're also asserting she did not allow them to perform inspection. She states however she is looking forward to a new housing arrangement she has reconnected with a woman who was the mother of one of her sons best friends. She states that she plans to move in with this woman and her 55 year old son. She states the benefits will be that this woman can support her particularly since the patient has significant back issues. She states she is planning to go get some steroid shots in her lower back and is hopeful that this will provide some relief to her pain.  She states otherwise her mood is been good she is able to laugh and she feels that all the above stressors will work their way out in a positive manner. She denies any complaints or side effects regarding her psychiatric medications. Her neurologist continues to feel her Depakote. Chief Complaint:  Chief Complaint    Follow-up; Medication Refill; Anxiety; Stress     Visit Diagnosis:     ICD-9-CM ICD-10-CM   1. PTSD (post-traumatic stress disorder) 309.81 F43.10 ALPRAZolam (XANAX) 0.5 MG tablet    Past Medical History:  Past Medical History  Diagnosis Date  . COPD (chronic obstructive pulmonary disease)   . PTSD (post-traumatic stress disorder)   . Rhabdomyolysis   . Bipolar 1 disorder   . Arthritis   . Stroke   . MI (myocardial infarction)   . Depression   . ADHD (attention deficit hyperactivity disorder)     Past Surgical History  Procedure Laterality Date  . Replacement total knee Left    Family History:  Family History  Problem Relation Age of Onset  . Hernia Mother   . Heart  disease Mother   . OCD Mother   . Diabetes Mother   . Parkinson's disease Father   . Bipolar disorder Sister   . Schizophrenia Sister   . ADD / ADHD Sister   . Alcohol abuse Brother   . Bipolar disorder Sister   . Paranoid behavior Sister   . ADD / ADHD Sister    Social History:  Social History   Social History  . Marital Status: Divorced    Spouse Name: N/A  . Number of Children: N/A  . Years of Education: N/A   Social History Main Topics  . Smoking status: Current Every Day Smoker -- 0.50 packs/day    Types: Cigarettes    Start date: 10/11/1984  . Smokeless tobacco: Never Used  . Alcohol Use: No  . Drug Use: No  . Sexual Activity: No   Other Topics Concern  . None   Social History Narrative   Additional History:   Assessment:   Musculoskeletal: Strength & Muscle Tone: within normal limits Gait & Station: normal Patient leans: N/A  Psychiatric Specialty Exam: Anxiety Patient reports no insomnia, nervous/anxious behavior or suicidal ideas.      Review of Systems  Psychiatric/Behavioral: Negative for depression, suicidal ideas, hallucinations, memory loss and substance abuse. The patient is not nervous/anxious and does not have insomnia.     Blood pressure 128/82, pulse 112, temperature 99.4 F (37.4 C), temperature source  Tympanic, height 5\' 1"  (1.549 m), weight 225 lb 12.8 oz (102.422 kg), SpO2 93 %.Body mass index is 42.69 kg/(m^2).  General Appearance: Well Groomed  Eye Contact:  Good  Speech:  Clear and Coherent and Normal Rate  Volume:  Normal  Mood:  Good  Affect:  Appropriate and Full Range  Thought Process:  Linear  Orientation:  Full (Time, Place, and Person)  Thought Content:  Negative  Suicidal Thoughts:  No  Homicidal Thoughts:  No  Memory:  Immediate;   Good Recent;   Good Remote;   Good  Judgement:  Good  Insight:  Good  Psychomotor Activity:  Negative  Concentration:  Good  Recall:  Good  Fund of Knowledge: Good  Language: Good   Akathisia:  Negative  Handed:  Right unknown  AIMS (if indicated):  Not done  Assets:  Communication Skills Housing Social Support  ADL's:  Intact  Cognition: WNL  Sleep:  Good   Is the patient at risk to self?  No. Has the patient been a risk to self in the past 6 months?  No. Has the patient been a risk to self within the distant past?  Yes.   Is the patient a risk to others?  No. Has the patient been a risk to others in the past 6 months?  No. Has the patient been a risk to others within the distant past?  No.  Current Medications: Current Outpatient Prescriptions  Medication Sig Dispense Refill  . albuterol (PROVENTIL HFA;VENTOLIN HFA) 108 (90 BASE) MCG/ACT inhaler Inhale 1-2 puffs into the lungs every 6 (six) hours as needed for wheezing or shortness of breath. 6.7 g 2  . ALPRAZolam (XANAX) 0.5 MG tablet Take 1 tablet (0.5 mg total) by mouth 3 (three) times daily as needed for anxiety. 90 tablet 4  . cyclobenzaprine (FLEXERIL) 5 MG tablet Take 1 tablet (5 mg total) by mouth 3 (three) times daily as needed for muscle spasms. 90 tablet 2  . divalproex (DEPAKOTE) 250 MG DR tablet Take 250 mg by mouth 2 (two) times daily.    Marland Kitchen escitalopram (LEXAPRO) 20 MG tablet Take 1 tablet (20 mg total) by mouth daily. 30 tablet 2  . GLUCOSAMINE HCL PO Take by mouth.    Marland Kitchen LYRICA 200 MG capsule Take 1 capsule (200 mg total) by mouth 2 (two) times daily. 60 capsule 5  . Misc Natural Products (OSTEO BI-FLEX ADV TRIPLE ST) TABS Take by mouth 2 (two) times daily.    . QUEtiapine (SEROQUEL XR) 200 MG 24 hr tablet Take 1 tablet (200 mg total) by mouth every evening. 20 tablet 2  . triamcinolone (KENALOG) 0.1 % paste Use as directed 1 application in the mouth or throat 3 (three) times daily.    Marland Kitchen ADVAIR DISKUS 250-50 MCG/DOSE AEPB     . albuterol (PROAIR HFA) 108 (90 BASE) MCG/ACT inhaler Inhale into the lungs.    . cyclobenzaprine (FLEXERIL) 10 MG tablet     . divalproex (DEPAKOTE) 250 MG DR tablet  Take by mouth.    . escitalopram (LEXAPRO) 20 MG tablet Take by mouth daily.     Marland Kitchen gabapentin (NEURONTIN) 800 MG tablet     . meloxicam (MOBIC) 7.5 MG tablet Take 1 tablet (7.5 mg total) by mouth 2 (two) times daily after a meal. Take one tablet after breakfast and after dinner. (Patient not taking: Reported on 09/09/2014) 42 tablet 1  . oxyCODONE-acetaminophen (PERCOCET/ROXICET) 5-325 MG per tablet     .  predniSONE (DELTASONE) 10 MG tablet     . predniSONE (DELTASONE) 20 MG tablet Take 3 tablets (60 mg total) by mouth daily. (Patient not taking: Reported on 07/30/2014) 15 tablet 0  . triamcinolone (KENALOG) 0.1 % paste      No current facility-administered medications for this visit.    Medical Decision Making:  Established Problem, Stable/Improving (1) and Review or order clinical lab tests (1)  Treatment Plan Summary:Medication management We will continue her Seroquel XR 200 mg at bedtime. We'll continue her alprazolam 0.5 mg 3 times a day as needed., Continue her Lexapro 20 mg a day. She is on Depakote 250 mg twice a day prescribed by her neurologist. She has been stable on this regimen and thus she will follow up in 3 months. She's been encouraged call any questions or concerns prior to her next appointment. Faith Rogue 10/12/2014, 3:08 PM

## 2014-10-13 ENCOUNTER — Ambulatory Visit: Payer: Self-pay | Admitting: Psychiatry

## 2014-10-13 DIAGNOSIS — F3189 Other bipolar disorder: Secondary | ICD-10-CM | POA: Diagnosis not present

## 2014-10-14 DIAGNOSIS — F3189 Other bipolar disorder: Secondary | ICD-10-CM | POA: Diagnosis not present

## 2014-10-15 ENCOUNTER — Encounter: Payer: Self-pay | Admitting: Anesthesiology

## 2014-10-15 ENCOUNTER — Ambulatory Visit: Payer: Commercial Managed Care - HMO | Attending: Anesthesiology | Admitting: Anesthesiology

## 2014-10-15 VITALS — BP 117/60 | HR 77 | Temp 98.1°F | Resp 16 | Ht 61.0 in | Wt 223.0 lb

## 2014-10-15 DIAGNOSIS — M5441 Lumbago with sciatica, right side: Secondary | ICD-10-CM | POA: Diagnosis not present

## 2014-10-15 DIAGNOSIS — F3189 Other bipolar disorder: Secondary | ICD-10-CM | POA: Diagnosis not present

## 2014-10-15 DIAGNOSIS — M545 Low back pain, unspecified: Secondary | ICD-10-CM

## 2014-10-15 DIAGNOSIS — M5442 Lumbago with sciatica, left side: Secondary | ICD-10-CM | POA: Diagnosis not present

## 2014-10-15 DIAGNOSIS — M5136 Other intervertebral disc degeneration, lumbar region: Secondary | ICD-10-CM | POA: Insufficient documentation

## 2014-10-15 DIAGNOSIS — M1288 Other specific arthropathies, not elsewhere classified, other specified site: Secondary | ICD-10-CM | POA: Diagnosis not present

## 2014-10-15 DIAGNOSIS — M47816 Spondylosis without myelopathy or radiculopathy, lumbar region: Secondary | ICD-10-CM | POA: Diagnosis not present

## 2014-10-15 DIAGNOSIS — E669 Obesity, unspecified: Secondary | ICD-10-CM | POA: Diagnosis not present

## 2014-10-15 DIAGNOSIS — M5416 Radiculopathy, lumbar region: Secondary | ICD-10-CM | POA: Diagnosis not present

## 2014-10-15 DIAGNOSIS — Z8673 Personal history of transient ischemic attack (TIA), and cerebral infarction without residual deficits: Secondary | ICD-10-CM | POA: Insufficient documentation

## 2014-10-15 DIAGNOSIS — G8929 Other chronic pain: Secondary | ICD-10-CM | POA: Diagnosis not present

## 2014-10-15 DIAGNOSIS — F3131 Bipolar disorder, current episode depressed, mild: Secondary | ICD-10-CM | POA: Diagnosis not present

## 2014-10-15 DIAGNOSIS — I639 Cerebral infarction, unspecified: Secondary | ICD-10-CM

## 2014-10-15 MED ORDER — TRIAMCINOLONE ACETONIDE 40 MG/ML IJ SUSP
INTRAMUSCULAR | Status: AC
  Administered 2014-10-15: 12:00:00
  Filled 2014-10-15: qty 2

## 2014-10-15 MED ORDER — BUPIVACAINE HCL (PF) 0.25 % IJ SOLN
INTRAMUSCULAR | Status: AC
  Administered 2014-10-15: 12:00:00
  Filled 2014-10-15: qty 30

## 2014-10-15 MED ORDER — IOHEXOL 180 MG/ML  SOLN
INTRAMUSCULAR | Status: AC
  Administered 2014-10-15: 12:00:00
  Filled 2014-10-15: qty 20

## 2014-10-15 MED ORDER — MIDAZOLAM HCL 5 MG/5ML IJ SOLN
INTRAMUSCULAR | Status: AC
  Administered 2014-10-15: 2 mg via INTRAVENOUS
  Filled 2014-10-15: qty 5

## 2014-10-15 NOTE — Progress Notes (Signed)
Subjective:    Patient ID: Nicole Austin, female    DOB: 10/18/1960, 54 y.o.   MRN: [id number]  HPI    Review of Systems     Objective:   Physical Exam        Assessment & Plan:   Bilateral transforaminal epidural steroid injection at L4 and L5  Date of procedure  10/15/2014  Preoperative diagnosis  Diagnoses     Back pain at L4-L5 level - Primary    ICD-9-CM: 724.2 ICD-10-CM: M54.5    DDD (degenerative disc disease), lumbar     ICD-9-CM: 722.52 ICD-10-CM: M51.36    Facet arthropathy, lumbosacral     ICD-9-CM: 721.3 ICD-10-CM: M47.817    Lumbar radiculopathy     ICD-9-CM: 724.4 ICD-10-CM: M54.16    Bipolar affective disorder, currently depressed, mild     ICD-9-CM: 296.51 ICD-10-CM: F31.31    Obesity     ICD-9-CM: 278.00 ICD-10-CM: E66.9    CVA (cerebral vascular accident)     ICD-9-CM: 434.91 ICD-10-CM      Postoperative diagnosis Same  Procedure  1 Bilateral lumbar transforaminal epidural steroid injection at L4 and L5  2 epidurogram with interpretation 3 fluoroscopic guidance  Surgeon Lance Bosch M.D.  Anesthesia  Mac anesthesia administered by the nurse and staff under my direction  Informed consent was obtained and the patient appeared to accept and understand the benefits and risks of the procedure Timeout was carried out in conjunction with the nursing staff to verified the precise low patient and side of the procedure  Description of procedure The patient was taken to the procedure room and placed in the prone position. Intravenous sedation and Mac anesthesia was administered by the nursing staff under my direction.  After appropriate sedation the lumbar sacral area was prepped with Betadine and the patient was fully draped. Fluoroscopic view of the lumbar spine was done and the pedicle of the right side was visualized. The skin over the 6:00 position of the pedicle on the right side of L4 4 and 5  was infiltrated with 3 cc of 1% lidocaine using a 25-gauge needle Adequate visualization of the pedicle was obtained by using an oblique fluoroscopic view. Then a 22-gauge 3-1/2 inch needle was introduced through the skin and directed towards the 6:00 position of the pedicles at L4 and L5. Then the needles were advanced into the superior medial portion of the foramen at L4 and L5 A lateral view was utilized to ensure optimal needle positioning and depth in the foramen  Epidurogram with interpretation  After negative aspiration, each needle was injected with 1 cc of Omnipaque 180 118 the performance of independent epidurograms for the purposes of detailing the epidural [place] anatomy along with nerve root anatomy at the corresponding levels which were L4-L5 further the visualization of the contrast media was also to ensure adequate spread of the injectate into the presumed affected areas. No intravascular or intrathecal spread was observed at any level and there were no corresponding paresthesias upon injection. Epidurograms were visualized along with the spread of contrast media into the exiting nerve root.  Transforaminal epidural steroid injection Again aspirations were negative for blurred CSF and other body fluids then 1.5 cc of 0.25% bupivacaine and 25 mg of Kenalog were injected into the L4 and L5 foramina  The same procedure was repeated on the left side at L4 and L5 and epidurograms were obtained.. Injections were made into the foramina on the left side of L4 and L5 and there  were no complications  The patient tolerated procedure quite well the needles were removed and adequate hemostasis was obtained and vital signs were stable There were no complications The patient was taken to the recovery room in satisfactory condition where she was observed and subsequently discharged home.  I will follow-up with the patient in the next 3 weeks.   Lance Bosch M.D.

## 2014-10-15 NOTE — Patient Instructions (Signed)
Pain Management Discharge Instructions  General Discharge Instructions :  If you need to reach your doctor call: Monday-Friday 8:00 am - 4:00 pm at [phone] or toll free [phone].  After clinic hours [phone] to have operator reach doctor.  Bring all of your medication bottles to all your appointments in the pain clinic.  To cancel or reschedule your appointment with Pain Management please remember to call 24 hours in advance to avoid a fee.  Refer to the educational materials which you have been given on: General Risks, I had my Procedure. Discharge Instructions, Post Sedation.  Post Procedure Instructions:  The drugs you were given will stay in your system until tomorrow, so for the next 24 hours you should not drive, make any legal decisions or drink any alcoholic beverages.  You may eat anything you prefer, but it is better to start with liquids then soups and crackers, and gradually work up to solid foods.  Please notify your doctor immediately if you have any unusual bleeding, trouble breathing or pain that is not related to your normal pain.  Depending on the type of procedure that was done, some parts of your body may feel week and/or numb.  This usually clears up by tonight or the next day.  Walk with the use of an assistive device or accompanied by an adult for the 24 hours.  You may use ice on the affected area for the first 24 hours.  Put ice in a Ziploc bag and cover with a towel and place against area 15 minutes on 15 minutes off.  You may switch to heat after 24 hours.Epidural Steroid Injection Patient Information  Description: The epidural space surrounds the nerves as they exit the spinal cord.  In some patients, the nerves can be compressed and inflamed by a bulging disc or a tight spinal canal (spinal stenosis).  By injecting steroids into the epidural space, we can bring irritated nerves into direct contact with a potentially helpful medication.  These  steroids act directly on the irritated nerves and can reduce swelling and inflammation which often leads to decreased pain.  Epidural steroids may be injected anywhere along the spine and from the neck to the low back depending upon the location of your pain.   After numbing the skin with local anesthetic (like Novocaine), a small needle is passed into the epidural space slowly.  You may experience a sensation of pressure while this is being done.  The entire block usually last less than 10 minutes.  Conditions which may be treated by epidural steroids:   Low back and leg pain  Neck and arm pain  Spinal stenosis  Post-laminectomy syndrome  Herpes zoster (shingles) pain  Pain from compression fractures  Preparation for the injection:  1. Do not eat any solid food or dairy products within 6 hours of your appointment.  2. You may drink clear liquids up to 2 hours before appointment.  Clear liquids include water, black coffee, juice or soda.  No milk or cream please. 3. You may take your regular medication, including pain medications, with a sip of water before your appointment  Diabetics should hold regular insulin (if taken separately) and take 1/2 normal NPH dos the morning of the procedure.  Carry some sugar containing items with you to your appointment. 4. A driver must accompany you and be prepared to drive you home after your procedure.  5. Bring all your current medications with your. 6. An IV may be inserted and   sedation may be given at the discretion of the physician.   7. A blood pressure cuff, EKG and other monitors will often be applied during the procedure.  Some patients may need to have extra oxygen administered for a short period. 8. You will be asked to provide medical information, including your allergies, prior to the procedure.  We must know immediately if you are taking blood thinners (like Coumadin/Warfarin)  Or if you are allergic to IV iodine contrast (dye). We must  know if you could possible be pregnant.  Possible side-effects:  Bleeding from needle site  Infection (rare, may require surgery)  Nerve injury (rare)  Numbness & tingling (temporary)  Difficulty urinating (rare, temporary)  Spinal headache ( a headache worse with upright posture)  Light -headedness (temporary)  Pain at injection site (several days)  Decreased blood pressure (temporary)  Weakness in arm/leg (temporary)  Pressure sensation in back/neck (temporary)  Call if you experience:  Fever/chills associated with headache or increased back/neck pain.  Headache worsened by an upright position.  New onset weakness or numbness of an extremity below the injection site  Hives or difficulty breathing (go to the emergency room)  Inflammation or drainage at the infection site  Severe back/neck pain  Any new symptoms which are concerning to you  Please note:  Although the local anesthetic injected can often make your back or neck feel good for several hours after the injection, the pain will likely return.  It takes 3-7 days for steroids to work in the epidural space.  You may not notice any pain relief for at least that one week.  If effective, we will often do a series of three injections spaced 3-6 weeks apart to maximally decrease your pain.  After the initial series, we generally will wait several months before considering a repeat injection of the same type.  If you have any questions, please call [phone] [facility] 

## 2014-10-15 NOTE — Progress Notes (Signed)
Safety precautions to be maintained throughout the outpatient stay will include: orient to surroundings, keep bed in low position, maintain call bell within reach at all times, provide assistance with transfer out of bed and ambulation.  

## 2014-10-18 ENCOUNTER — Telehealth: Payer: Self-pay

## 2014-10-18 ENCOUNTER — Telehealth: Payer: Self-pay | Admitting: Pain Medicine

## 2014-10-18 ENCOUNTER — Other Ambulatory Visit: Payer: Self-pay | Admitting: *Deleted

## 2014-10-18 DIAGNOSIS — F3189 Other bipolar disorder: Secondary | ICD-10-CM | POA: Diagnosis not present

## 2014-10-18 MED ORDER — TRAMADOL HCL 50 MG PO TABS: 50.0000 mg | ORAL_TABLET | Freq: Three times a day (TID) | ORAL | Status: DC

## 2014-10-18 NOTE — Patient Outreach (Signed)
RNCM picked up prescription for pt from MD office and took it to [place] related to the pt being unable to get this done with public transportation before her scheduled train leaving to see her mother. After dropping off prescription at the drug store the pharmacy informed RNCM  medication may cause an interaction with pt's other medications so they were unable to fill it until speaking with the MD office.RNCM made pharmacy aware of pt's plans to go out of town to see her mother and they said they would try again in the morning and pt could also pick up prescription and take it with her. RNCM relayed to pt the pharmacy needed to speak with the MD before filling the prescription and she could come pick up the prescription tomorrow and take it with her to [facility] and have a pharmacy call the MD from there. RNCM called pt to let her know this information.  Pt voiced understanding and appreciation for RNCM's help with this.  Plan: RNCM will keep scheduled appointment in one month.   Rutherford Limerick RN, BSN  [facility] Care Management [phone])

## 2014-10-18 NOTE — Telephone Encounter (Signed)
pt called states that her mom is in ccu in [place]. she is leaving tomorrow afternoon to be on the way up to see her mom.  per the patient her mom is not expect to live much longer, she states her mom kidney and lungs are not working and she on a ventilator. Pt states that she can not sleep and can't stay focus, she wants to know if it is anything she can do to help.

## 2014-10-18 NOTE — Addendum Note (Signed)
Addended by: Lance Bosch on: 10/18/2014 04:58 PM   Modules accepted: Orders

## 2014-10-18 NOTE — Telephone Encounter (Signed)
Dr. Idelia Salm notified of this message. Will write a script for Tramadol. Patient notified.

## 2014-10-18 NOTE — Telephone Encounter (Signed)
This patient had an intervention last Friday and has some residual pain. She is traveling out-of-state on a family emergency and would like to have some medication to allow her to travel and 2 management while she is away from home Therefore I would give her a 2 week course of tramadol 50 mg 3 times a day after meals would give her 42 pills and will schedule for her to return to the clinic when she returns back to [place].  Lance Bosch M.D.

## 2014-10-18 NOTE — Telephone Encounter (Signed)
Pt had procedure on Friday , hurting real bad. Pt will be traveling to [place] on Wednesday due to Mother in coma in hospital. Ms Mom would like to get some pain meds to help her pain while she travels.

## 2014-10-18 NOTE — Patient Outreach (Signed)
[facility]) Care Management   10/18/2014  Nicole Austin 1960-11-25 [id number]  Nicole Austin is an 54 y.o. female  Subjective: " I am so upset. I can't stop crying since my dad told me my mom was in ICU and not expected to make it." "I can't sleep, I'm in a lot of pain. And I am scared I'm going to have another episode like when my nephew died. I just can't handle things like this well."  Objective: Blood pressure 138/72, pulse 88, resp. rate 18, height 1.549 m (5\' 1" ), weight 223 lb (101.152 kg), SpO2 98 %.   Review of Systems  Constitutional: Positive for weight loss.  Eyes: Positive for blurred vision.  Respiratory: Positive for sputum production.   Musculoskeletal: Positive for back pain.  Neurological: Positive for headaches.  Psychiatric/Behavioral: Positive for depression. The patient is nervous/anxious.     Physical Exam  Constitutional: She is oriented to person, place, and time. She appears well-developed and well-nourished.  Cardiovascular: Normal rate, regular rhythm and normal heart sounds.   Pulses:      Dorsalis pedis pulses are 2+ on the right side, and 2+ on the left side.  Respiratory: She has wheezes in the right upper field, the right middle field and the left upper field.  GI: Soft. Bowel sounds are normal.  Musculoskeletal:       Lumbar back: She exhibits pain.  Neurological: She is alert and oriented to person, place, and time.  Skin: Skin is warm, dry and intact.  Psychiatric: Her mood appears anxious. She is agitated. She exhibits a depressed mood.  Pt crying upset related to her mother being in ICU and dying. Travelling to [place].     Current Medications:   Medications reviewed please see encounter for currently taking meds. Current Outpatient Prescriptions  Medication Sig Dispense Refill  . albuterol (PROVENTIL HFA;VENTOLIN HFA) 108 (90 BASE) MCG/ACT inhaler Inhale 1-2 puffs into the lungs every 6 (six) hours as needed for  wheezing or shortness of breath. 6.7 g 2  . ALPRAZolam (XANAX) 0.5 MG tablet Take 1 tablet (0.5 mg total) by mouth 3 (three) times daily as needed for anxiety. 90 tablet 4  . cyclobenzaprine (FLEXERIL) 5 MG tablet Take 1 tablet (5 mg total) by mouth 3 (three) times daily as needed for muscle spasms. 90 tablet 2  . divalproex (DEPAKOTE) 250 MG DR tablet Take 250 mg by mouth 2 (two) times daily.    Marland Kitchen escitalopram (LEXAPRO) 20 MG tablet Take 1 tablet (20 mg total) by mouth daily. 30 tablet 2  . LYRICA 200 MG capsule Take 1 capsule (200 mg total) by mouth 2 (two) times daily. 60 capsule 5  . Misc Natural Products (OSTEO BI-FLEX ADV TRIPLE ST) TABS Take by mouth 2 (two) times daily.    . Multiple Vitamin (MULTIVITAMIN) tablet Take 1 tablet by mouth daily.    . QUEtiapine (SEROQUEL XR) 200 MG 24 hr tablet Take 1 tablet (200 mg total) by mouth every evening. 20 tablet 3  . triamcinolone (KENALOG) 0.1 % paste Use as directed 1 application in the mouth or throat 3 (three) times daily.    Marland Kitchen GLUCOSAMINE HCL PO Take by mouth.     No current facility-administered medications for this visit.    Functional Status:   In your present state of health, do you have any difficulty performing the following activities: 08/05/2014 07/20/2014  Hearing? N -  Vision? Y -  Difficulty concentrating or making decisions?  Y -  Walking or climbing stairs? Y -  Dressing or bathing? Y -  Doing errands, shopping? N -  Preparing Food and eating ? N Y  Using the Toilet? N Y  In the past six months, have you accidently leaked urine? Y N  Do you have problems with loss of bowel control? N N  Managing your Medications? N Y  Managing your Finances? N N  Housekeeping or managing your Housekeeping? Y N    Fall/Depression Screening:    PHQ 2/9 Scores 10/15/2014 09/10/2014 08/25/2014 08/05/2014 07/20/2014  PHQ - 2 Score 1 0 1 0 0  PHQ- 9 Score 9 - - - -  Exception Documentation - (No Data) - - -    Assessment: See physical  assessment above. General: When RNCM arrived pt was sobbing on the phone. She was relaying that her mother was placed in ICU in [place] after complications from a routine hernia repair operation and not expected to live. She stated she was leaving on Wednesday on the train to go to [place] to be with her family. When pt got off the phone she relayed this same information to [facility] and continued crying. RNCM listened and supported pt. RNCM talked with pt about all of her support and resources if she started having suicidal thoughts or feelings of hopelessness. We discussed her calling her psychiatrist for a plan also. RNCM gave pt information about grieving and loss. By the end of the conversation pt was able to list several family and friends she could turn to for support. Jeana pt's assistant through touched by [employer]udenc[facility] Pair planned to assist pt in packing and getting ready for her trip to [place] to see her mother. Also pt staying the night with her sister on tomorrow night so pt not planning to be alone during this time.   Lungs with scattered wheezes. Denies increased cough or colored sputum production. Continues to smoke.  Pt's bilateral LE swelling resolved. Pt without swelling to hands or feet and skin intact.  Pt reported continued weight loss, healthy eating and exercise.    Plan: Pt will call psychiatrist. RNCM will plan to see pt in one month.  RNCM will plan to d/c pt at this time. Rutherford Limerick RN, BSN  [facility] Care Management [phone])

## 2014-10-18 NOTE — Patient Outreach (Signed)
RNCM received a call from pt after home visit. Pt inquired if RNCM could pick a prescription for her at the doctor's office related to she had no one else who could do it for her. RNCM let pt know she could and would take it to the pharmacy for her. Pt expressed gratefulness.   Plan: RNCM will pick up prescription from Dr. Jimmye Norman office at [place] [facility] before [place] will take prescription to [place] for pt.   Rutherford Limerick RN, BSN  [facility] Care Management [phone])

## 2014-10-19 ENCOUNTER — Telehealth: Payer: Self-pay | Admitting: Psychiatry

## 2014-10-19 DIAGNOSIS — F3189 Other bipolar disorder: Secondary | ICD-10-CM | POA: Diagnosis not present

## 2014-10-19 NOTE — Telephone Encounter (Signed)
She contacted the clinic yesterday. Spoke with patient today. Patient indicated she wanted some medicine to help her feel more comfortable as she traveled to see her sick mother in [place]. She indicated she got in contact with her pain management doctor who called in medication and she is comfortable with the medication from her pain management doctor and denies any needs from this writer at this time. AW

## 2014-11-04 ENCOUNTER — Telehealth: Payer: Self-pay | Admitting: Psychiatry

## 2014-11-04 ENCOUNTER — Telehealth: Payer: Self-pay | Admitting: Anesthesiology

## 2014-11-04 NOTE — Telephone Encounter (Signed)
Attempted to call again, same as previous.

## 2014-11-04 NOTE — Telephone Encounter (Signed)
Mother passed and she is trying to travel back here / having severe pain and cramps needs refill please call has appt with dr Idelia Salm on 9-14

## 2014-11-04 NOTE — Telephone Encounter (Signed)
Attempted to call patient, message said, "due to network difficulties, call cannot be completed at this time."

## 2014-11-05 ENCOUNTER — Emergency Department
Admission: EM | Admit: 2014-11-05 | Discharge: 2014-11-06 | Disposition: A | Discharge status: Other Acute Inpatient Hospital | Payer: Commercial Managed Care - HMO | Attending: Student | Admitting: Student

## 2014-11-05 ENCOUNTER — Emergency Department: Payer: Commercial Managed Care - HMO

## 2014-11-05 ENCOUNTER — Telehealth: Payer: Self-pay | Admitting: Family Medicine

## 2014-11-05 ENCOUNTER — Encounter: Payer: Self-pay | Admitting: Emergency Medicine

## 2014-11-05 ENCOUNTER — Other Ambulatory Visit: Payer: Self-pay | Admitting: *Deleted

## 2014-11-05 DIAGNOSIS — F1310 Sedative, hypnotic or anxiolytic abuse, uncomplicated: Secondary | ICD-10-CM | POA: Diagnosis not present

## 2014-11-05 DIAGNOSIS — F329 Major depressive disorder, single episode, unspecified: Secondary | ICD-10-CM | POA: Insufficient documentation

## 2014-11-05 DIAGNOSIS — F1910 Other psychoactive substance abuse, uncomplicated: Secondary | ICD-10-CM | POA: Diagnosis not present

## 2014-11-05 DIAGNOSIS — Z720 Tobacco use: Secondary | ICD-10-CM | POA: Insufficient documentation

## 2014-11-05 DIAGNOSIS — R45851 Suicidal ideations: Secondary | ICD-10-CM

## 2014-11-05 DIAGNOSIS — Z79899 Other long term (current) drug therapy: Secondary | ICD-10-CM | POA: Insufficient documentation

## 2014-11-05 DIAGNOSIS — R079 Chest pain, unspecified: Secondary | ICD-10-CM | POA: Diagnosis not present

## 2014-11-05 DIAGNOSIS — F419 Anxiety disorder, unspecified: Secondary | ICD-10-CM | POA: Insufficient documentation

## 2014-11-05 DIAGNOSIS — F6389 Other impulse disorders: Secondary | ICD-10-CM | POA: Diagnosis not present

## 2014-11-05 DIAGNOSIS — R000 Tachycardia, unspecified: Secondary | ICD-10-CM | POA: Diagnosis not present

## 2014-11-05 HISTORY — DX: Unspecified asthma, uncomplicated: J45.909

## 2014-11-05 LAB — COMPREHENSIVE METABOLIC PANEL
ALBUMIN: 3.7 g/dL (ref 3.5–5.0)
ALK PHOS: 89 U/L (ref 38–126)
ALT: 26 U/L (ref 14–54)
AST: 24 U/L (ref 15–41)
Anion gap: 9 (ref 5–15)
BILIRUBIN TOTAL: 0.3 mg/dL (ref 0.3–1.2)
BUN: 10 mg/dL (ref 6–20)
CALCIUM: 8.8 mg/dL — AB (ref 8.9–10.3)
CO2: 28 mmol/L (ref 22–32)
CREATININE: 0.9 mg/dL (ref 0.44–1.00)
Chloride: 100 mmol/L — ABNORMAL LOW (ref 101–111)
GFR calc Af Amer: >60 mL/min (ref >60)
GLUCOSE: 87 mg/dL (ref 65–99)
Potassium: 3.7 mmol/L (ref 3.5–5.1)
Sodium: 137 mmol/L (ref 135–145)
TOTAL PROTEIN: 6.6 g/dL (ref 6.5–8.1)

## 2014-11-05 LAB — CBC WITH DIFFERENTIAL/PLATELET
BASOS ABS: 0.1 10*3/uL (ref 0–0.1)
BASOS PCT: 1 %
Eosinophils Absolute: 0.1 10*3/uL (ref 0–0.7)
Eosinophils Relative: 1 %
HEMATOCRIT: 40 % (ref 35.0–47.0)
HEMOGLOBIN: 13.2 g/dL (ref 12.0–16.0)
LYMPHS PCT: 29 %
Lymphs Abs: 3 10*3/uL (ref 1.0–3.6)
MCH: 29.6 pg (ref 26.0–34.0)
MCHC: 32.9 g/dL (ref 32.0–36.0)
MCV: 89.9 fL (ref 80.0–100.0)
MONO ABS: 0.8 10*3/uL (ref 0.2–0.9)
MONOS PCT: 8 %
NEUTROS ABS: 6.5 10*3/uL (ref 1.4–6.5)
NEUTROS PCT: 61 %
Platelets: 218 10*3/uL (ref 150–440)
RBC: 4.45 MIL/uL (ref 3.80–5.20)
RDW: 16.1 % — ABNORMAL HIGH (ref 11.5–14.5)
WBC: 10.5 10*3/uL (ref 3.6–11.0)

## 2014-11-05 LAB — URINE DRUG SCREEN, QUALITATIVE (ARMC ONLY)
AMPHETAMINES, UR SCREEN: NOT DETECTED
Barbiturates, Ur Screen: NOT DETECTED
Benzodiazepine, Ur Scrn: POSITIVE — AB
COCAINE METABOLITE, UR ~~LOC~~: NOT DETECTED
Cannabinoid 50 Ng, Ur ~~LOC~~: NOT DETECTED
MDMA (ECSTASY) UR SCREEN: NOT DETECTED
Methadone Scn, Ur: NOT DETECTED
OPIATE, UR SCREEN: NOT DETECTED
PHENCYCLIDINE (PCP) UR S: NOT DETECTED
Tricyclic, Ur Screen: POSITIVE — AB

## 2014-11-05 LAB — TROPONIN I: Troponin I: <0.03 ng/mL (ref <0.031)

## 2014-11-05 LAB — FIBRIN DERIVATIVES D-DIMER (ARMC ONLY): Fibrin derivatives D-dimer (ARMC): 1023.86 — ABNORMAL HIGH (ref 0–499)

## 2014-11-05 LAB — ACETAMINOPHEN LEVEL: Acetaminophen (Tylenol), Serum: <10 ug/mL — ABNORMAL LOW (ref 10–30)

## 2014-11-05 LAB — SALICYLATE LEVEL: Salicylate Lvl: <4 mg/dL (ref 2.8–30.0)

## 2014-11-05 LAB — ETHANOL

## 2014-11-05 MED ORDER — ESCITALOPRAM OXALATE 10 MG PO TABS
20.0000 mg | ORAL_TABLET | Freq: Every day | ORAL | Status: DC
  Administered 2014-11-06: 20 mg via ORAL
  Filled 2014-11-05: qty 2

## 2014-11-05 MED ORDER — CYCLOBENZAPRINE HCL 10 MG PO TABS
5.0000 mg | ORAL_TABLET | Freq: Three times a day (TID) | ORAL | Status: DC | PRN
  Administered 2014-11-05: 5 mg via ORAL
  Filled 2014-11-05: qty 1

## 2014-11-05 MED ORDER — QUETIAPINE FUMARATE 25 MG PO TABS
25.0000 mg | ORAL_TABLET | Freq: Two times a day (BID) | ORAL | Status: DC
  Administered 2014-11-06: 25 mg via ORAL
  Filled 2014-11-05: qty 1

## 2014-11-05 MED ORDER — ALPRAZOLAM 0.5 MG PO TABS
0.5000 mg | ORAL_TABLET | Freq: Three times a day (TID) | ORAL | Status: DC | PRN
  Administered 2014-11-05 – 2014-11-06 (×2): 0.5 mg via ORAL
  Filled 2014-11-05 (×2): qty 1

## 2014-11-05 MED ORDER — HYDROXYZINE PAMOATE 25 MG PO CAPS: ORAL_CAPSULE | ORAL | Status: DC

## 2014-11-05 MED ORDER — QUETIAPINE FUMARATE ER 200 MG PO TB24
200.0000 mg | ORAL_TABLET | Freq: Every day | ORAL | Status: DC
  Administered 2014-11-05: 200 mg via ORAL
  Filled 2014-11-05 (×2): qty 1

## 2014-11-05 MED ORDER — SODIUM CHLORIDE 0.9 % IV BOLUS (SEPSIS)
500.0000 mL | Freq: Once | INTRAVENOUS | Status: AC
  Administered 2014-11-05: 500 mL via INTRAVENOUS

## 2014-11-05 MED ORDER — DIVALPROEX SODIUM 500 MG PO DR TAB
500.0000 mg | DELAYED_RELEASE_TABLET | Freq: Two times a day (BID) | ORAL | Status: DC
  Administered 2014-11-06: 500 mg via ORAL
  Filled 2014-11-05: qty 1

## 2014-11-05 MED ORDER — IOHEXOL 350 MG/ML SOLN
100.0000 mL | Freq: Once | INTRAVENOUS | Status: AC | PRN
  Administered 2014-11-05: 100 mL via INTRAVENOUS

## 2014-11-05 NOTE — ED Notes (Signed)

## 2014-11-05 NOTE — ED Notes (Signed)
Dr Elayne Guerin called for report on pt prior to speaking to pt.  SOC machine in room with pt awaiting Dr Elayne Guerin to come on line

## 2014-11-05 NOTE — Telephone Encounter (Signed)
Attempted to call, same as previous.

## 2014-11-05 NOTE — ED Notes (Signed)
BEHAVIORAL HEALTH ROUNDING Patient sleeping: No. Patient alert and oriented: yes Behavior appropriate: Yes.   Nutrition and fluids offered: Yes  Toileting and hygiene offered: Yes  Sitter present: q15 min observations and security camera monitoring Law enforcement present: Yes [employer] 

## 2014-11-05 NOTE — ED Notes (Signed)
BEHAVIORAL HEALTH ROUNDING Patient sleeping: No. Patient alert and oriented: yes Behavior appropriate: Yes.  ; If no, describe:  Nutrition and fluids offered: Yes  Toileting and hygiene offered: Yes  Sitter present: yes Law enforcement present: Yes  

## 2014-11-05 NOTE — ED Notes (Signed)
ENVIRONMENTAL ASSESSMENT Potentially harmful objects out of patient reach: Yes.   Personal belongings secured: Yes.   Patient dressed in hospital provided attire only: Yes.   Plastic bags out of patient reach: Yes.   Patient care equipment (cords, cables, call bells, lines, and drains) shortened, removed, or accounted for: Yes.   Equipment and supplies removed from bottom of stretcher: Yes.   Potentially toxic materials out of patient reach: yes Sharps container removed or out of patient reach: Yes.

## 2014-11-05 NOTE — BH Assessment (Signed)
Assessment Note  Nicole Austin is an 54 y.o. female presenting to the ED via EMS from home.  Per EMS, [place] [employer] was called out for a psychiatric evaluation then patient started having chest pain.  Per EMS, patient told them that her mother died 06/05/2022 of this week and that [place] were [place] and patient is upset that she left without her.  Pt reports mother died unexpectedly after having multiple surgeries in a 2-week span in [place].  Patient is tearful, but responds to questions. Pt states chest pain started after her mother died, but got better with anxiety medications.   Pt denies being suicidal.  She states that when she said that she and her mother were supposed to go to the [place] together did not mean that she was ready to die now.  She reports that she is a [place] and that her reference to the rapture was based on her religious beliefs.  Pt reports she was having an anxiety attack and feeling overwhelmed over the sudden death of her mother.  Pt states she is out of Seroquel and will feel better once she gets her prescription refilled.  Pt denies drug/alcohol use, HI and A/V hallucinations.   Axis I: Anxiety Disorder NOS and Bipolar, Depressed  Past Medical History:  Past Medical History  Diagnosis Date  . COPD (chronic obstructive pulmonary disease)   . PTSD (post-traumatic stress disorder)   . Rhabdomyolysis   . Bipolar 1 disorder   . Arthritis   . Stroke   . MI (myocardial infarction)   . Depression   . ADHD (attention deficit hyperactivity disorder)   . Asthma     Past Surgical History  Procedure Laterality Date  . Replacement total knee Left     Family History:  Family History  Problem Relation Age of Onset  . Hernia Mother   . Heart disease Mother   . OCD Mother   . Diabetes Mother   . Parkinson's disease Father   . Bipolar disorder Sister   . Schizophrenia Sister   . ADD / ADHD Sister   . Alcohol abuse Brother   .  Bipolar disorder Sister   . Paranoid behavior Sister   . ADD / ADHD Sister     Social History:  reports that she has been smoking Cigarettes.  She started smoking about 30 years ago. She has been smoking about 0.50 packs per day. She has never used smokeless tobacco. She reports that she does not drink alcohol or use illicit drugs.  Additional Social History:  Alcohol / Drug Use History of alcohol / drug use?: No history of alcohol / drug abuse (Pt denies drug/alcohol use)  CIWA: CIWA-Ar BP: (!) 107/51 mmHg Pulse Rate: 91 COWS:    Allergies:  Allergies  Allergen Reactions  . Codeine Nausea And Vomiting    Home Medications:  (Not in a hospital admission)  OB/GYN Status:  No LMP recorded (lmp unknown). Patient is postmenopausal.  General Assessment Data Location of Assessment: [facility] ED TTS Assessment: In system Is this a Tele or Face-to-Face Assessment?: Face-to-Face Is this an Initial Assessment or a Re-assessment for this encounter?: Initial Assessment Marital status: Single Maiden name: N/A Is patient pregnant?: No Pregnancy Status: No Living Arrangements: Alone Can pt return to current living arrangement?: Yes Admission Status: Involuntary Is patient capable of signing voluntary admission?: Yes Referral Source: Self/Family/Friend Insurance type: [facility] Medicare     Crisis Care Plan Living Arrangements: Alone  Name of Psychiatrist: Dr. Jimmye Norman Name of Therapist: Dr. Jimmye Norman  Education Status Is patient currently in school?: No Current Grade: N/A Highest grade of school patient has completed: 15 Name of school: [employer] [place] [employer] Contact person: N/a  Risk to self with the past 6 months Suicidal Ideation: No Has patient been a risk to self within the past 6 months prior to admission? : No Suicidal Intent: No Has patient had any suicidal intent within the past 6 months prior to admission? : No Is patient at risk for suicide?: No Suicidal Plan?: No-Not  Currently/Within Last 6 Months Has patient had any suicidal plan within the past 6 months prior to admission? : No Access to Means: No What has been your use of drugs/alcohol within the last 12 months?: None reported Previous Attempts/Gestures: No How many times?: 0 Other Self Harm Risks: None reported Triggers for Past Attempts: None known Intentional Self Injurious Behavior: None Family Suicide History: No Recent stressful life event(s): Loss (Comment) (Recent death of mother) Persecutory voices/beliefs?: No Depression: No Substance abuse history and/or treatment for substance abuse?: No Suicide prevention information given to non-admitted patients: Not applicable  Risk to Others within the past 6 months Homicidal Ideation: No Does patient have any lifetime risk of violence toward others beyond the six months prior to admission? : No Thoughts of Harm to Others: No Current Homicidal Intent: No Current Homicidal Plan: No Access to Homicidal Means: No Identified Victim: None reported History of harm to others?: No Assessment of Violence: On admission Violent Behavior Description: None reported Does patient have access to weapons?: No Criminal Charges Pending?: No Does patient have a court date: No Is patient on probation?: No  Psychosis Hallucinations: None noted Delusions: None noted  Mental Status Report Appearance/Hygiene: In scrubs Eye Contact: Good Motor Activity: Freedom of movement Speech: Logical/coherent Level of Consciousness: Alert Mood: Anxious, Sad Affect: Anxious Anxiety Level: Minimal Thought Processes: Coherent Judgement: Unimpaired Orientation: Person, Place, Time, Situation, Appropriate for developmental age Obsessive Compulsive Thoughts/Behaviors: None  Cognitive Functioning Concentration: Normal Memory: Recent Intact IQ: Average Insight: Good Impulse Control: Good Appetite: Fair Weight Loss: 0 Weight Gain: 0 Sleep: Decreased Total Hours  of Sleep: 2 Vegetative Symptoms: None  ADLScreening [facility] Assessment Services) Patient's cognitive ability adequate to safely complete daily activities?: Yes Patient able to express need for assistance with ADLs?: Yes Independently performs ADLs?: Yes (appropriate for developmental age)  Prior Inpatient Therapy Prior Inpatient Therapy: No  Prior Outpatient Therapy Prior Outpatient Therapy: No Prior Therapy Dates: 11/04/2014 Prior Therapy Facilty/Provider(s): [facility] Reason for Treatment: bipolar;anxiety Does patient have an ACCT team?: No Does patient have Intensive In-House Services?  : No Does patient have [facility] services? : No Does patient have P4CC services?: No  ADL Screening (condition at time of admission) Patient's cognitive ability adequate to safely complete daily activities?: Yes Patient able to express need for assistance with ADLs?: Yes Independently performs ADLs?: Yes (appropriate for developmental age)       Abuse/Neglect Assessment (Assessment to be complete while patient is alone) Physical Abuse: Denies Verbal Abuse: Denies Sexual Abuse: Denies Exploitation of patient/patient's resources: Denies Self-Neglect: Denies Values / Beliefs Cultural Requests During Hospitalization: None Spiritual Requests During Hospitalization: None Consults Spiritual Care Consult Needed: No Social Work Consult Needed: No Regulatory affairs officer (For Healthcare) Does patient have an advance directive?: No Would patient like information on creating an advanced directive?: No - patient declined information    Additional Information 1:1 In Past 12 Months?: No CIRT Risk: No Elopement  Risk: No     Disposition:  Disposition Initial Assessment Completed for this Encounter: Yes Disposition of Patient: Other dispositions Other disposition(s): Other (Comment) (Disposition pending Psych MD consult)  On Site Evaluation by:   Reviewed with Physician:    Oneita Hurt 11/05/2014 9:17  PM

## 2014-11-05 NOTE — ED Notes (Signed)
BEHAVIORAL HEALTH ROUNDING Patient sleeping: No. Patient alert and oriented: yes Behavior appropriate: Yes.   Nutrition and fluids offered: Yes  Toileting and hygiene offered: Yes  Sitter present: q15 min observations Law enforcement present: Yes [employer] ENVIRONMENTAL ASSESSMENT Potentially harmful objects out of patient reach: Yes.   Personal belongings secured: Yes.   Patient dressed in hospital provided attire only: Yes.   Plastic bags out of patient reach: Yes.   Patient care equipment (cords, cables, call bells, lines, and drains) shortened, removed, or accounted for: Yes.   Equipment and supplies removed from bottom of stretcher: Yes.   Potentially toxic materials out of patient reach: Yes.   Sharps container removed or out of patient reach: Yes.    ED BHU [place] Is the patient under IVC or is there intent for IVC: Yes.    Is IVC current? Yes Is the patient medically cleared: Yes.   Is there vacancy in the ED BHU: Yes.   Is the population mix appropriate for patient: Yes.   Is the patient awaiting placement in inpatient or outpatient setting: Yes.  Inpatient facility Has the patient had a psychiatric consult: Yes.   Survey of unit performed for contraband, proper placement and condition of furniture, tampering with fixtures in bathroom, shower, and each patient room: Yes.  ; Findings: none APPEARANCE/BEHAVIOR Cooperative,calm NEURO ASSESSMENT Orientation: A&O x3 Hallucinations: none noted at this time Speech: Normal Gait: normal RESPIRATORY ASSESSMENT Breathing Pattern-regular, no respiratory distress noted CARDIOVASCULAR ASSESSMENT Skin color appropriate for age and race GASTROINTESTINAL ASSESSMENT no GI distress noted EXTREMITIES Moves all extremities, no distress noted PLAN OF CARE Provide calm/safe environment. Vital signs assessed twice daily. ED BHU Assessment once each 12-hour shift. Collaborate with intake RN daily or as condition  indicates. Assure the ED provider has rounded once each shift. Provide and encourage hygiene. Provide redirection as needed. Assess for escalating behavior; address immediately and inform ED provider.  Assess family dynamic and appropriateness for visitation as needed: Yes.  Educate the patient/family about BHU procedures/visitation: Yes.

## 2014-11-05 NOTE — ED Notes (Signed)
Pt arrived via EMS from home.  Per EMS, [place] PD was called out for a psychiatric evaluation then patient started having chest pain.  Per EMS, patient told them that her mother died Jun 27, 2022 of this week and that they were [place] to go [place] and patient is upset that she left without her.  Pt reports mother died unexpectedly after having multiple surgeries in a 2 week span in [place].  Patient is tearful, but responds to questions. Pt states chest pain started after her mother died, but got better with anxiety medications.  Patient unable to stop crying.  Per EMS, patient was here before in last several months after she was found on the floor after 2-3 days and was confused. It was suspected she had overdosed.  Barnett Applebaum her home health aide was present and called the crisis line.

## 2014-11-05 NOTE — ED Notes (Signed)
Report called to RN Manuela Schwartz in [place], pt to go to [facility]

## 2014-11-05 NOTE — Telephone Encounter (Signed)
pt called earlier and was told that no more xanax and that a different medication was called in for her. pt was stress that she needs to take medication as directed and not take more the what doctor advises. (see previous tc message)

## 2014-11-05 NOTE — Telephone Encounter (Signed)
Attempted to call patient, same as previous.

## 2014-11-05 NOTE — ED Notes (Signed)
BEHAVIORAL HEALTH ROUNDING Patient sleeping: No. Patient alert and oriented: yes Behavior appropriate: Yes.  ; If no, describe:  Nutrition and fluids offered: waiting for MD to see patient Toileting and hygiene offered: Yes  Sitter present: yes Law enforcement present: Yes [employer]

## 2014-11-05 NOTE — Patient Outreach (Signed)
RNCM returned a phone call from pt, who sounded distressed on the phone. Pt relaying that her mother had passed away on 06/25/22 and she was having severe anxiety. Pt stated she had talked with her doctor's offices and they were calling her something in. Pt crying and hyper-ventilating on the phone. Pt yelling upset about the loss of her mom. RNCM talked pt through some deep breathing exercises and requested she go get her friend who lives in a nearby apartment to sit with her until her home health aide arrived. RNCM stayed on the phone with pt comforting her and listening to her until she was able to reach her friend/neighbor who agreed to stay with her until aide arrived at [place] spoke with friend and requested she stay with pt, friend agreed she would. RNCM continued to converse with pt. RNCM asked pt if she felt she was going to harm herself and she stated "no her mom would not like that." As RNCM and pt talked she was able to get calmer and stop crying. RNCM requested pt call her back if she felt she wanted to harm herself, pt verbally agreed she would. RNCM talked with pt about medication coming, and appropriate dose. Pt voiced understanding. RNCM told pt not to take more medication than was ordered, pt stated she wouldn't. RNCM asked pt if she felt ok enough for RNCM to hang up pt voiced yes.  Plan: RNCM will call [facility] to collaborate on pt's status. RNCM will call pt back this afternoon to make sure she is ok.   Rutherford Limerick RN, BSN  [facility] Care Management [phone])

## 2014-11-05 NOTE — Telephone Encounter (Signed)
Patient has overused her Xanax. Will not order any early. She can have Vistaril 25-50 mg twice daily until her next refill of Xanax on 11/09/2014.Marland Kitchen

## 2014-11-05 NOTE — ED Provider Notes (Signed)
[facility] Emergency Department Provider Note  ____________________________________________  Time seen: Approximately 4:00 PM  I have reviewed the triage vital signs and the nursing notes.   HISTORY  Chief Complaint Chest Pain and Depression    HPI Nicole Austin is a 54 y.o. female with COPD, PTSD, bipolar disorder, history of CVA, anxiety and depression who presents for evaluation of severe anxiety and depression, gradual onset, worsening over the past 2 days since she found out her mother died suddenly. Today, she called a crisis hot line and stated that she wanted to die and the police arrived to her house where she was crying uncontrollably. They placed an IVC but just before coming to the hospital, the patient told them that she had been having chest pain all day and so they called EMS to bring the patient to the hospital. Patient reports that she has had cramping pains in the left chest which has been constant today, not exertional, not radiating, not associated with any short of breath, no pleuritic. She reports this does not feel like a heart attack. She has no homicidal ideation, no audiovisual hallucinations. Current severity of symptoms is moderate to severe. She denies any personal or family history of PE or DVT.   Past Medical History  Diagnosis Date  . COPD (chronic obstructive pulmonary disease)   . PTSD (post-traumatic stress disorder)   . Rhabdomyolysis   . Bipolar 1 disorder   . Arthritis   . Stroke   . MI (myocardial infarction)   . Depression   . ADHD (attention deficit hyperactivity disorder)   . Asthma     Patient Active Problem List   Diagnosis Date Noted  . Burn of finger 09/23/2014  . Anxiety 08/16/2014  . Affective bipolar disorder 08/16/2014  . Gout 08/16/2014  . H/O gastrointestinal disease 08/16/2014  . Extreme obesity 08/16/2014  . Muscle spasms of both lower extremities 08/16/2014  . Bipolar 1 disorder, depressed  08/13/2014  . Back pain, chronic 08/13/2014  . Low back pain with sciatica 08/04/2014  . Compulsive tobacco user syndrome 08/04/2014  . Current tobacco use 08/04/2014  . Polysubstance abuse 07/04/2014  . Anxiety, generalized 11/24/2013  . Imbalance 11/24/2013  . Blurred vision 11/24/2013  . Cephalalgia 11/24/2013  . COPD, moderate 11/18/2013  . HPV (human papilloma virus) infection 07/17/2013  . Arthritis of knee, degenerative 07/15/2013  . H/O neoplasm 07/31/2011    Past Surgical History  Procedure Laterality Date  . Replacement total knee Left     Current Outpatient Rx  Name  Route  Sig  Dispense  Refill  . albuterol (PROVENTIL HFA;VENTOLIN HFA) 108 (90 BASE) MCG/ACT inhaler   Inhalation   Inhale 1-2 puffs into the lungs every 6 (six) hours as needed for wheezing or shortness of breath.   6.7 g   2   . ALPRAZolam (XANAX) 0.5 MG tablet   Oral   Take 1 tablet (0.5 mg total) by mouth 3 (three) times daily as needed for anxiety.   90 tablet   4   . cyclobenzaprine (FLEXERIL) 5 MG tablet   Oral   Take 1 tablet (5 mg total) by mouth 3 (three) times daily as needed for muscle spasms.   90 tablet   2   . divalproex (DEPAKOTE) 250 MG DR tablet   Oral   Take 250 mg by mouth 2 (two) times daily.         Marland Kitchen escitalopram (LEXAPRO) 20 MG tablet   Oral  Take 1 tablet (20 mg total) by mouth daily.   30 tablet   2   . hydrOXYzine (VISTARIL) 25 MG capsule      1-2 tablets twice daily as needed for anxiety. Patient taking differently: Take 25-50 mg by mouth 2 (two) times daily as needed for anxiety.    120 capsule   0   . lidocaine (XYLOCAINE) 5 % ointment   Topical   Apply 1 application topically as needed for mild pain.         Marland Kitchen LYRICA 200 MG capsule   Oral   Take 1 capsule (200 mg total) by mouth 2 (two) times daily.   60 capsule   5     Dispense as written.   . Misc Natural Products (OSTEO BI-FLEX ADV TRIPLE ST) TABS   Oral   Take 1 tablet by mouth 2  (two) times daily.         . Multiple Vitamin (MULTIVITAMIN WITH MINERALS) TABS tablet   Oral   Take 1 tablet by mouth daily.         . QUEtiapine (SEROQUEL XR) 200 MG 24 hr tablet   Oral   Take 1 tablet (200 mg total) by mouth every evening.   20 tablet   3   . traMADol (ULTRAM) 50 MG tablet   Oral   Take 1 tablet (50 mg total) by mouth 3 (three) times daily after meals.   42 tablet   0     Allergies Codeine  Family History  Problem Relation Age of Onset  . Hernia Mother   . Heart disease Mother   . OCD Mother   . Diabetes Mother   . Parkinson's disease Father   . Bipolar disorder Sister   . Schizophrenia Sister   . ADD / ADHD Sister   . Alcohol abuse Brother   . Bipolar disorder Sister   . Paranoid behavior Sister   . ADD / ADHD Sister     Social History Social History  Substance Use Topics  . Smoking status: Current Every Day Smoker -- 0.50 packs/day    Types: Cigarettes    Start date: 10/11/1984  . Smokeless tobacco: Never Used  . Alcohol Use: No    Review of Systems Constitutional: No fever/chills Eyes: No visual changes. ENT: No sore throat. Cardiovascular: + chest pain. Respiratory: Denies shortness of breath. Gastrointestinal: No abdominal pain.  No nausea, no vomiting.  No diarrhea.  No constipation. Genitourinary: Negative for dysuria. Musculoskeletal: Negative for back pain. Skin: Negative for rash. Neurological: Negative for headaches, focal weakness or numbness.  10-point ROS otherwise negative.  ____________________________________________   PHYSICAL EXAM:  Filed Vitals:   11/05/14 1608 11/05/14 2109  BP: 155/72 107/51  Pulse: 73 91  Temp: 98.6 F (37 C) 98.4 F (36.9 C)  TempSrc: Oral Oral  Resp: 24 18  Height: 5\' 1"  (1.549 m)   Weight: 220 lb (99.791 kg)   SpO2: 100% 96%      Constitutional: Alert and oriented. Crying, sobbing but redirectable. Eyes: Conjunctivae are normal. PERRL. EOMI. Head: Atraumatic. Nose:  No congestion/rhinnorhea. Mouth/Throat: Mucous membranes are moist.  Oropharynx non-erythematous. Neck: No stridor.   Cardiovascular: Normal rate, regular rhythm. Grossly normal heart sounds.  Good peripheral circulation. Respiratory: Normal respiratory effort.  No retractions. Lungs CTAB. Gastrointestinal: Soft and nontender. No distention. No abdominal bruits. No CVA tenderness. Genitourinary: deferred Musculoskeletal: No lower extremity tenderness nor edema.  No joint effusions.Tenderness to palpation throughout the left chest  wall, palpation reproduces pain.  Neurologic:  Normal speech and language. No gross focal neurologic deficits are appreciated. No gait instability. Skin:  Skin is warm, dry and intact. No rash noted. Psychiatric: Mood and affect are normal. Speech and behavior are normal.  ____________________________________________   LABS (all labs ordered are listed, but only abnormal results are displayed)  Labs Reviewed  CBC WITH DIFFERENTIAL/PLATELET - Abnormal; Notable for the following:    RDW 16.1 (*)    All other components within normal limits  COMPREHENSIVE METABOLIC PANEL - Abnormal; Notable for the following:    Chloride 100 (*)    Calcium 8.8 (*)    All other components within normal limits  FIBRIN DERIVATIVES D-DIMER ([facility] ONLY) - Abnormal; Notable for the following:    Fibrin derivatives D-dimer ([facility]) 1023.86 (*)    All other components within normal limits  ACETAMINOPHEN LEVEL - Abnormal; Notable for the following:    Acetaminophen (Tylenol), Serum <10 (*)    All other components within normal limits  URINE DRUG SCREEN, QUALITATIVE ([facility] ONLY) - Abnormal; Notable for the following:    Tricyclic, Ur Screen POSITIVE (*)    Benzodiazepine, Ur Scrn POSITIVE (*)    All other components within normal limits  TROPONIN I  SALICYLATE LEVEL  ETHANOL  TROPONIN I   ____________________________________________  EKG  ED ECG REPORT I, Joanne Gavel, the  attending physician, personally viewed and interpreted this ECG.   Date: 11/05/2014  EKG Time: 16:01  Rate: 95  Rhythm: normal EKG, normal sinus rhythm  Axis: normal  Intervals:none  ST&T Change: No acute ST segment elevation  ____________________________________________  RADIOLOGY  CXR  IMPRESSION: No active cardiopulmonary disease.  ____________________________________________   PROCEDURES  Procedure(s) performed: None  Critical Care performed: No  ____________________________________________   INITIAL IMPRESSION / ASSESSMENT AND PLAN / ED COURSE  Pertinent labs & imaging results that were available during my care of the patient were reviewed by me and considered in my medical decision making (see chart for details).  Nicole Austin is a 54 y.o. female with COPD, PTSD, bipolar disorder, history of CVA, anxiety and depression who presents for evaluation of severe anxiety and depression, gradual onset, worsening over the past 2 days since she found out her mother died suddenly. Per EMS, she was tachycardic on their arrival. Here in the emergency department she has no tachycardia, vital signs stable, she is afebrile. She is tearful but redirectable. EKG reassuring, troponin negative, doubt ACS. Her pain is reproducible in the left chest wall and suspected as muscular skeletal however her d-dimer is elevated so will obtain CTA chest to rule out PE. We'll continue IVC, consult psychiatry and Behavioral Health.   ----------------------------------------- 9:55 PM on 11/05/2014 ----------------------------------------- Second troponin negative, doubt ACS. CTA chest negative for PE. Patient is medically cleared. SOC/psychiatry evaluated her and recommended inpatient admission.  ____________________________________________   FINAL CLINICAL IMPRESSION(S) / ED DIAGNOSES  Final diagnoses:  Chest pain, unspecified chest pain type  Suicidal ideation      Joanne Gavel,  MD 11/05/14 2304

## 2014-11-05 NOTE — Telephone Encounter (Signed)
Pt called stating her mom passed away on June 01, 2022 and she is having a really hard time with her passing and is asking if you could call her something in for her anxiety. [facility] in [place].

## 2014-11-05 NOTE — ED Notes (Signed)
Report received from [place] in room. No complaints or concerns voiced at this time. No abnormal behavior noted at this time. Will continue to monitor with q15 min checks. ODS officer in area.

## 2014-11-05 NOTE — ED Notes (Signed)
Pt transported to CT with officer at bedside.

## 2014-11-05 NOTE — Patient Outreach (Signed)
[facility]) Care Management  11/05/2014  Nicole Austin [birth year] [id number]  Phone call from [place] stating that patient's mother died recenlty and that patient was having a difficult time managing her grief.  Phone call made to patient.  Patient crying uncontrollably, stating "I want to go to sleep and not wake up".  Patient very difficult to console.    Crisis line for [facility] contacted [phone] patient's thoughts of suicide ideation reported by this Education officer, museum.  This social worker was told that patient would have to call the crisis line herself as I was not patient's guardian.  Per patient " I can't even think straight to call".  This Education officer, museum given [place] permission to call the crisis line back with patient on 3 way.  Patient was able to speak with clinician on the phone informing her of her mother's recent death and thoughts of wanting to harm herself.  Per patient, she could not be sure  her own safety in the amount of time that a clinician from mobile crisis could get to her home to assess her(up to 2 hours).  [place] office contacted by crisis line.  This social worker was able to speak to the officer who confirmed that patient would be taken to the hospital today for further assessment.   Nicole Austin [facility] Care Management [phone]

## 2014-11-05 NOTE — Telephone Encounter (Signed)
called patient and notified her that she would not get anymore xanax that rx for vistaril was sent in.  it was stressed to patient that she needed to take rx as doctor order do not take any extra unless she gets it oked by doctor first.

## 2014-11-05 NOTE — Telephone Encounter (Signed)
pt called states that her mother just passed away and she was wanted to get her xanax refill earl. Pt states pharmancy would not let her unless it was oked by you.  pt told me that she had been taking 6-8 because she didnt want to cry in front of her mother who was in the hospital. But when i asked her how many she had she stated she had 25. (but pt did not go count when i asked her to count how many she had left.) (i did called the pharmacy and the pharmacist  stated that pt received medication on  10-12-14 and that the earliest the patient can have medication refill will be the 11-09-14.    Patient wants to know if you can give her something else to take if she can't get anymore of the xanax

## 2014-11-05 NOTE — ED Notes (Signed)
Returned from CT.

## 2014-11-05 NOTE — ED Notes (Signed)
Called deborah for [employer]  To order consult  [phone]

## 2014-11-05 NOTE — ED Notes (Signed)
Pt in room. No complaints or concerns voiced at this time. No abnormal behavior noted at this time. Will continue to monitor with q15 min checks. ODS officer in area. 

## 2014-11-06 DIAGNOSIS — F1310 Sedative, hypnotic or anxiolytic abuse, uncomplicated: Secondary | ICD-10-CM | POA: Diagnosis not present

## 2014-11-06 DIAGNOSIS — F1910 Other psychoactive substance abuse, uncomplicated: Secondary | ICD-10-CM | POA: Diagnosis not present

## 2014-11-06 DIAGNOSIS — R079 Chest pain, unspecified: Secondary | ICD-10-CM | POA: Diagnosis not present

## 2014-11-06 DIAGNOSIS — F419 Anxiety disorder, unspecified: Secondary | ICD-10-CM | POA: Diagnosis not present

## 2014-11-06 DIAGNOSIS — Z720 Tobacco use: Secondary | ICD-10-CM | POA: Diagnosis not present

## 2014-11-06 DIAGNOSIS — F329 Major depressive disorder, single episode, unspecified: Secondary | ICD-10-CM | POA: Diagnosis not present

## 2014-11-06 DIAGNOSIS — Z79899 Other long term (current) drug therapy: Secondary | ICD-10-CM | POA: Diagnosis not present

## 2014-11-06 NOTE — Consult Note (Signed)
[facility] Consult   Reason for Consult:  Follow up Referring Physician:  ER Patient Identification: Nicole Austin MRN:  [id number] Principal Diagnosis: <principal problem not specified> Diagnosis:   Patient Active Problem List   Diagnosis Date Noted  . Burn of finger [T23.029A] 09/23/2014  . Anxiety [F41.9] 08/16/2014  . Affective bipolar disorder [F31.9] 08/16/2014  . Gout [M10.9] 08/16/2014  . H/O gastrointestinal disease [Z87.19] 08/16/2014  . Extreme obesity [E66.01] 08/16/2014  . Muscle spasms of both lower extremities [M62.838] 08/16/2014  . Bipolar 1 disorder, depressed [F31.9] 08/13/2014  . Back pain, chronic [M54.9, G89.29] 08/13/2014  . Low back pain with sciatica [M54.40] 08/04/2014  . Compulsive tobacco user syndrome [F17.200] 08/04/2014  . Current tobacco use [Z72.0] 08/04/2014  . Polysubstance abuse [F19.10] 07/04/2014  . Anxiety, generalized [F41.1] 11/24/2013  . Imbalance [R26.89] 11/24/2013  . Blurred vision [H53.8] 11/24/2013  . Cephalalgia [R51] 11/24/2013  . COPD, moderate [J44.9] 11/18/2013  . HPV (human papilloma virus) infection [B97.7] 07/17/2013  . Arthritis of knee, degenerative [M17.9] 07/15/2013  . H/O neoplasm [Z87.898] 07/31/2011    Total Time spent with patient: 1 hour  Subjective:   Nicole Austin is a 54 y.o. female patient admitted with a  Long H/O Bi-polar disorder and is divorced and lives by herself in an apt with her dog. Cc" Started getting depressed over my mother's death and today is her 76th birthday.  HPI:  Pt has a long h/O Mi and is being followed by Dr. Jimmye Norman and haS at comign  Up in Nov of 2016. Father is to call her and sisters about ,other's service which will be in [place] and she died in [place]. Pt got very depressed about  The wole situation and be cause of poor coping skills she decompensated and called for help and stated she was suicidal but now she is eager to go home as she has 2 sisters that live  close by and are supportive. HPI Elements:     Past Medical History:  Past Medical History  Diagnosis Date  . COPD (chronic obstructive pulmonary disease)   . PTSD (post-traumatic stress disorder)   . Rhabdomyolysis   . Bipolar 1 disorder   . Arthritis   . Stroke   . MI (myocardial infarction)   . Depression   . ADHD (attention deficit hyperactivity disorder)   . Asthma     Past Surgical History  Procedure Laterality Date  . Replacement total knee Left    Family History:  Family History  Problem Relation Age of Onset  . Hernia Mother   . Heart disease Mother   . OCD Mother   . Diabetes Mother   . Parkinson's disease Father   . Bipolar disorder Sister   . Schizophrenia Sister   . ADD / ADHD Sister   . Alcohol abuse Brother   . Bipolar disorder Sister   . Paranoid behavior Sister   . ADD / ADHD Sister    Social History:  History  Alcohol Use No     History  Drug Use No    Social History   Social History  . Marital Status: Divorced    Spouse Name: N/A  . Number of Children: N/A  . Years of Education: N/A   Social History Main Topics  . Smoking status: Current Every Day Smoker -- 0.50 packs/day    Types: Cigarettes    Start date: 10/11/1984  . Smokeless tobacco: Never Used  . Alcohol Use: No  .  Drug Use: No  . Sexual Activity: No   Other Topics Concern  . None   Social History Narrative   Additional Social History:    History of alcohol / drug use?: No history of alcohol / drug abuse (Pt denies drug/alcohol use)                     Allergies:   Allergies  Allergen Reactions  . Codeine Nausea And Vomiting    Labs:  Results for orders placed or performed during the hospital encounter of 11/05/14 (from the past 48 hour(s))  Urine Drug Screen, Qualitative ([facility] only)     Status: Abnormal   Collection Time: 11/05/14  4:20 PM  Result Value Ref Range   Tricyclic, Ur Screen POSITIVE (A) NONE DETECTED   Amphetamines, Ur Screen NONE  DETECTED NONE DETECTED   MDMA (Ecstasy)Ur Screen NONE DETECTED NONE DETECTED   Cocaine Metabolite,Ur [place] NONE DETECTED NONE DETECTED   Opiate, Ur Screen NONE DETECTED NONE DETECTED   Phencyclidine (PCP) Ur S NONE DETECTED NONE DETECTED   Cannabinoid 50 Ng, Ur [place] NONE DETECTED NONE DETECTED   Barbiturates, Ur Screen NONE DETECTED NONE DETECTED   Benzodiazepine, Ur Scrn POSITIVE (A) NONE DETECTED   Methadone Scn, Ur NONE DETECTED NONE DETECTED    Comment: (NOTE) 683  Tricyclics, urine               Cutoff 1000 ng/mL 200  Amphetamines, urine             Cutoff 1000 ng/mL 300  MDMA (Ecstasy), urine           Cutoff 500 ng/mL 400  Cocaine Metabolite, urine       Cutoff 300 ng/mL 500  Opiate, urine                   Cutoff 300 ng/mL 600  Phencyclidine (PCP), urine      Cutoff 25 ng/mL 700  Cannabinoid, urine              Cutoff 50 ng/mL 800  Barbiturates, urine             Cutoff 200 ng/mL 900  Benzodiazepine, urine           Cutoff 200 ng/mL 1000 Methadone, urine                Cutoff 300 ng/mL 1100 1200 The urine drug screen provides only a preliminary, unconfirmed 1300 analytical test result and should not be used for non-medical 1400 purposes. Clinical consideration and professional judgment should 1500 be applied to any positive drug screen result due to possible 1600 interfering substances. A more specific alternate chemical method 1700 must be used in order to obtain a confirmed analytical result.  1800 Gas chromato graphy / mass spectrometry (GC/MS) is the preferred 1900 confirmatory method.   CBC with Differential     Status: Abnormal   Collection Time: 11/05/14  4:28 PM  Result Value Ref Range   WBC 10.5 3.6 - 11.0 K/uL   RBC 4.45 3.80 - 5.20 MIL/uL   Hemoglobin 13.2 12.0 - 16.0 g/dL   HCT 40.0 35.0 - 47.0 %   MCV 89.9 80.0 - 100.0 fL   MCH 29.6 26.0 - 34.0 pg   MCHC 32.9 32.0 - 36.0 g/dL   RDW 16.1 (H) 11.5 - 14.5 %   Platelets 218 150 - 440 K/uL   Neutrophils Relative  % 61 %   Neutro Abs  6.5 1.4 - 6.5 K/uL   Lymphocytes Relative 29 %   Lymphs Abs 3.0 1.0 - 3.6 K/uL   Monocytes Relative 8 %   Monocytes Absolute 0.8 0.2 - 0.9 K/uL   Eosinophils Relative 1 %   Eosinophils Absolute 0.1 0 - 0.7 K/uL   Basophils Relative 1 %   Basophils Absolute 0.1 0 - 0.1 K/uL  Comprehensive metabolic panel     Status: Abnormal   Collection Time: 11/05/14  4:28 PM  Result Value Ref Range   Sodium 137 135 - 145 mmol/L   Potassium 3.7 3.5 - 5.1 mmol/L   Chloride 100 (L) 101 - 111 mmol/L   CO2 28 22 - 32 mmol/L   Glucose, Bld 87 65 - 99 mg/dL   BUN 10 6 - 20 mg/dL   Creatinine, Ser 0.90 0.44 - 1.00 mg/dL   Calcium 8.8 (L) 8.9 - 10.3 mg/dL   Total Protein 6.6 6.5 - 8.1 g/dL   Albumin 3.7 3.5 - 5.0 g/dL   AST 24 15 - 41 U/L   ALT 26 14 - 54 U/L   Alkaline Phosphatase 89 38 - 126 U/L   Total Bilirubin 0.3 0.3 - 1.2 mg/dL   GFR calc non Af Amer >60 >60 mL/min   GFR calc Af Amer >60 >60 mL/min    Comment: (NOTE) The eGFR has been calculated using the CKD EPI equation. This calculation has not been validated in all clinical situations. eGFR's persistently <60 mL/min signify possible Chronic Kidney Disease.    Anion gap 9 5 - 15  Troponin I     Status: None   Collection Time: 11/05/14  4:28 PM  Result Value Ref Range   Troponin I <0.03 <0.031 ng/mL    Comment:        NO INDICATION OF MYOCARDIAL INJURY.   Fibrin derivatives D-Dimer ([facility] only)     Status: Abnormal   Collection Time: 11/05/14  4:28 PM  Result Value Ref Range   Fibrin derivatives D-dimer ([facility]) 1023.86 (H) 0 - 499    Comment: <> Exclusion of Venous Thromboembolism (VTE) - OUTPATIENTS ONLY        (Emergency Department or [place])             0-499 ng/ml (FEU)  : With a low to intermediate pretest                                        probability for VTE this test result                                        excludes the diagnosis of VTE.           > 499 ng/ml (FEU)  : VTE not excluded.   Additional work up                                   for VTE is required.   <>  Testing on Inpatients and Evaluation of Disseminated Intravascular        Coagulation ([facility])             Reference Range:   0-499 ng/ml (FEU)   Acetaminophen level     Status: Abnormal  Collection Time: 11/05/14  4:28 PM  Result Value Ref Range   Acetaminophen (Tylenol), Serum <10 (L) 10 - 30 ug/mL    Comment:        THERAPEUTIC CONCENTRATIONS VARY SIGNIFICANTLY. A RANGE OF 10-30 ug/mL MAY BE AN EFFECTIVE CONCENTRATION FOR MANY PATIENTS. HOWEVER, SOME ARE BEST TREATED AT CONCENTRATIONS OUTSIDE THIS RANGE. ACETAMINOPHEN CONCENTRATIONS >150 ug/mL AT 4 HOURS AFTER INGESTION AND >50 ug/mL AT 12 HOURS AFTER INGESTION ARE OFTEN ASSOCIATED WITH TOXIC REACTIONS.   Salicylate level     Status: None   Collection Time: 11/05/14  4:28 PM  Result Value Ref Range   Salicylate Lvl <4.8 2.8 - 30.0 mg/dL  Ethanol     Status: None   Collection Time: 11/05/14  4:28 PM  Result Value Ref Range   Alcohol, Ethyl (B) <5 <5 mg/dL    Comment:        LOWEST DETECTABLE LIMIT FOR SERUM ALCOHOL IS 5 mg/dL FOR MEDICAL PURPOSES ONLY   Troponin I     Status: None   Collection Time: 11/05/14  7:35 PM  Result Value Ref Range   Troponin I <0.03 <0.031 ng/mL    Comment:        NO INDICATION OF MYOCARDIAL INJURY.     Vitals: Blood pressure 110/57, pulse 103, temperature 97.7 F (36.5 C), temperature source Oral, resp. rate 17, height 5' 1" (1.549 m), weight 220 lb (99.791 kg), SpO2 97 %.  Risk to Self: Suicidal Ideation: No Suicidal Intent: No Is patient at risk for suicide?: No Suicidal Plan?: No-Not Currently/Within Last 6 Months Access to Means: No What has been your use of drugs/alcohol within the last 12 months?: None reported How many times?: 0 Other Self Harm Risks: None reported Triggers for Past Attempts: None known Intentional Self Injurious Behavior: None Risk to Others: Homicidal Ideation:  No Thoughts of Harm to Others: No Current Homicidal Intent: No Current Homicidal Plan: No Access to Homicidal Means: No Identified Victim: None reported History of harm to others?: No Assessment of Violence: On admission Violent Behavior Description: None reported Does patient have access to weapons?: No Criminal Charges Pending?: No Does patient have a court date: No Prior Inpatient Therapy: Prior Inpatient Therapy: No Prior Outpatient Therapy: Prior Outpatient Therapy: No Prior Therapy Dates: 11/04/2014 Prior Therapy Facilty/Provider(s): [facility] Reason for Treatment: bipolar;anxiety Does patient have an ACCT team?: No Does patient have Intensive In-House Services?  : No Does patient have [facility] services? : No Does patient have [facility] services?: No  Current Facility-Administered Medications  Medication Dose Route Frequency Provider Last Rate Last Dose  . ALPRAZolam Duanne Moron) tablet 0.5 mg  0.5 mg Oral TID PRN Joanne Gavel, MD   0.5 mg at 11/06/14 1134  . cyclobenzaprine (FLEXERIL) tablet 5 mg  5 mg Oral TID PRN Joanne Gavel, MD   5 mg at 11/05/14 2133  . divalproex (DEPAKOTE) DR tablet 500 mg  500 mg Oral Q12H Joanne Gavel, MD   500 mg at 11/06/14 1133  . escitalopram (LEXAPRO) tablet 20 mg  20 mg Oral Daily Joanne Gavel, MD   20 mg at 11/06/14 1132  . QUEtiapine (SEROQUEL XR) 24 hr tablet 200 mg  200 mg Oral QHS Joanne Gavel, MD   200 mg at 11/05/14 2132  . QUEtiapine (SEROQUEL) tablet 25 mg  25 mg Oral BID Joanne Gavel, MD   25 mg at 11/06/14 1133   Current Outpatient Prescriptions  Medication Sig Dispense Refill  .  albuterol (PROVENTIL HFA;VENTOLIN HFA) 108 (90 BASE) MCG/ACT inhaler Inhale 1-2 puffs into the lungs every 6 (six) hours as needed for wheezing or shortness of breath. 6.7 g 2  . ALPRAZolam (XANAX) 0.5 MG tablet Take 1 tablet (0.5 mg total) by mouth 3 (three) times daily as needed for anxiety. 90 tablet 4  . cyclobenzaprine (FLEXERIL) 5 MG tablet Take 1 tablet (5 mg  total) by mouth 3 (three) times daily as needed for muscle spasms. 90 tablet 2  . divalproex (DEPAKOTE) 250 MG DR tablet Take 250 mg by mouth 2 (two) times daily.    Marland Kitchen escitalopram (LEXAPRO) 20 MG tablet Take 1 tablet (20 mg total) by mouth daily. 30 tablet 2  . hydrOXYzine (VISTARIL) 25 MG capsule 1-2 tablets twice daily as needed for anxiety. (Patient taking differently: Take 25-50 mg by mouth 2 (two) times daily as needed for anxiety. ) 120 capsule 0  . lidocaine (XYLOCAINE) 5 % ointment Apply 1 application topically as needed for mild pain.    Marland Kitchen LYRICA 200 MG capsule Take 1 capsule (200 mg total) by mouth 2 (two) times daily. 60 capsule 5  . Misc Natural Products (OSTEO BI-FLEX ADV TRIPLE ST) TABS Take 1 tablet by mouth 2 (two) times daily.    . Multiple Vitamin (MULTIVITAMIN WITH MINERALS) TABS tablet Take 1 tablet by mouth daily.    . QUEtiapine (SEROQUEL XR) 200 MG 24 hr tablet Take 1 tablet (200 mg total) by mouth every evening. 20 tablet 3  . traMADol (ULTRAM) 50 MG tablet Take 1 tablet (50 mg total) by mouth 3 (three) times daily after meals. 42 tablet 0    Musculoskeletal: Strength & Muscle Tone: within normal limits Gait & Station: normal Patient leans: N/A  Psychiatric Specialty Exam: Physical Exam  Nursing note and vitals reviewed.   ROS  Blood pressure 110/57, pulse 103, temperature 97.7 F (36.5 C), temperature source Oral, resp. rate 17, height 5' 1" (1.549 m), weight 220 lb (99.791 kg), SpO2 97 %.Body mass index is 41.59 kg/(m^2).  General Appearance: Casual  Eye Contact::  Fair  Speech:  Normal Rate  Volume:  Normal  Mood:  Anxious, Depressed and fair  Affect:  Appropriate  Thought Process:  Circumstantial and Logical  Orientation:  Full (Time, Place, and Person)  Thought Content:  WDL  Suicidal Thoughts:  No  Homicidal Thoughts:  No  Memory:  Immediate;   Fair Recent;   Fair Remote;   Fair adequate  Judgement:  Fair  Insight:  Fair  Psychomotor Activity:   Normal  Concentration:  Fair  Recall:  [employer] [place]  Language: Fair  Akathisia:  No  Handed:  Right  AIMS (if indicated):     Assets:  Communication Skills Desire for Improvement Housing Social Support Transportation  ADL's:  Intact  Cognition: WNL  Sleep:      Medical Decision Making: Review of Psycho-Social Stressors (1)  Treatment Plan Summary: Plan D/C IVC and discharge pt home and she has enough meds at home and will call for an early apt with Dr. Jimmye Norman  on 11/08/2014 which she already did ith her Home Health Nurse  Plan:  No evidence of imminent risk to self or others at present.   Disposition: as above.  Dewain Penning 11/06/2014 3:17 PM

## 2014-11-06 NOTE — ED Notes (Signed)

## 2014-11-06 NOTE — ED Notes (Signed)
Patient observed lying in bed with eyes closed  Even, unlabored respirations observed   NAD pt appears to be sleeping  I will continue to monitor along with every 15 minute visual observations and ongoing security camera monitoring    

## 2014-11-06 NOTE — ED Notes (Signed)
Discharge instructions reviewed with her and she verbalized agreement and understanding  A bag with her purse was given back in the sally port area - items that have been locked in the safe have been returned to her by [place]   She verbalizes that she received back all items that she came here with

## 2014-11-06 NOTE — ED Notes (Addendum)
BEHAVIORAL HEALTH ROUNDING Patient sleeping: Yes.   Patient alert and oriented: yes Behavior appropriate: Yes.  ; If no, describe:  Nutrition and fluids offered: Yes  Toileting and hygiene offered: Yes  Sitter present: not applicable Law enforcement present: Yes  

## 2014-11-06 NOTE — ED Notes (Signed)
BEHAVIORAL HEALTH ROUNDING Patient sleeping: No. Patient alert and oriented: yes Behavior appropriate: Yes.  ; If no, describe:  Nutrition and fluids offered: yes Toileting and hygiene offered: Yes  Sitter present: q15 minute observations and security camera monitoring Law enforcement present: Yes  ODS  

## 2014-11-06 NOTE — ED Notes (Signed)
BEHAVIORAL HEALTH ROUNDING Patient sleeping: Yes.   Patient alert and oriented: yes Behavior appropriate: Yes.  ; If no, describe:  Nutrition and fluids offered: Yes  Toileting and hygiene offered: Yes  Sitter present: not applicable Law enforcement present: Yes  

## 2014-11-06 NOTE — Telephone Encounter (Signed)
She needs to discuss this with her Psychiatrist.

## 2014-11-06 NOTE — ED Notes (Signed)
ED BHU PLACEMENT JUSTIFICATION Is the patient under IVC or is there intent for IVC: Yes.   Is the patient medically cleared: Yes.   Is there vacancy in the ED BHU: Yes.   Is the population mix appropriate for patient: Yes.   Is the patient awaiting placement in inpatient or outpatient setting: No. Has the patient had a psychiatric consult:  Consult pending Survey of unit performed for contraband, proper placement and condition of furniture, tampering with fixtures in bathroom, shower, and each patient room: Yes.  ; Findings:  APPEARANCE/BEHAVIOR Calm and cooperative NEURO ASSESSMENT Orientation: oriented x3  Denies pain Hallucinations: No.None noted (Hallucinations) Speech: Normal Gait: normal RESPIRATORY ASSESSMENT even unlabored respirations  CARDIOVASCULAR ASSESSMENT Pulses equal  regular rate  Skin warm and dry GASTROINTESTINAL ASSESSMENT no GI complaint EXTREMITIES Full ROM PLAN OF CARE Provide calm/safe environment. Vital signs assessed twice daily. ED BHU Assessment once each 12-hour shift. Collaborate with intake RN daily or as condition indicates. Assure the ED provider has rounded once each shift. Provide and encourage hygiene. Provide redirection as needed. Assess for escalating behavior; address immediately and inform ED provider.  Assess family dynamic and appropriateness for visitation as needed: Yes.  ; If necessary, describe findings:  Educate the patient/family about BHU procedures/visitation: Yes.  ; If necessary, describe findings:   

## 2014-11-06 NOTE — ED Notes (Signed)
Am meds administered as ordered  assessment completed  Lunch provided along with an extra drink   She denies pain    Appropriate to stimulation  No verbalized needs or concerns at this time  NAD assessed  Continue to monitor

## 2014-11-06 NOTE — ED Notes (Signed)
BEHAVIORAL HEALTH ROUNDING Patient sleeping: Yes.   Patient alert and oriented: eyes closed  Appears asleep Behavior appropriate: Yes.  ; If no, describe:  Nutrition and fluids offered: Yes  Toileting and hygiene offered: sleeping Sitter present: q 15 minute observations and security camera monitoring Law enforcement present: yes  ODS 

## 2014-11-06 NOTE — ED Notes (Signed)
Supper provided along with an extra drink  Pt observed with no unusual behavior  Appropriate to stimulation  No verbalized needs or concerns at this time  NAD assessed  Continue to monitor 

## 2014-11-06 NOTE — ED Notes (Signed)
She will be discharged to home  Pt has belongings in the safe that Angie has gone to retrieve    She has called her sister to come and pick her up

## 2014-11-06 NOTE — ED Notes (Signed)
Breakfast provided   Patient observed lying in bed with eyes closed  Even, unlabored respirations observed   NAD pt appears to be sleeping  I will continue to monitor along with every 15 minute visual observations and ongoing security camera monitoring    

## 2014-11-06 NOTE — ED Provider Notes (Addendum)
-----------------------------------------   4:31 PM on 11/06/2014 -----------------------------------------   Blood pressure 110/57, pulse 103, temperature 97.7 F (36.5 C), temperature source Oral, resp. rate 17, height 5\' 1"  (1.549 m), weight 220 lb (99.791 kg), SpO2 97 %.  The patient had no acute events since last update.  Calm and cooperative at this time.  No suicidal or homicidal ideation. No complaints of chest pain at this time. She did evaluated by Dr. Dillard Cannon of psychiatry who deems appropriate for discharge and will follow-up as an outpatient with her known psychiatrist Dr. Jimmye Norman. Per Dr. Dillard Cannon the patient is alert he called for an appointment.  Orbie Pyo, MD 11/06/14 [phone]  Dr. Dillard Cannon also says the patient has a full supply of medicines at home.  Orbie Pyo, MD 11/06/14 [phone]

## 2014-11-06 NOTE — ED Provider Notes (Signed)
-----------------------------------------   7:25 AM on 11/06/2014 -----------------------------------------   Blood pressure 107/51, pulse 91, temperature 98.4 F (36.9 C), temperature source Oral, resp. rate 18, height 5\' 1"  (1.549 m), weight 220 lb (99.791 kg), SpO2 96 %.  The patient had no acute events since last update.  Calm and cooperative at this time.  Disposition is pending per Psychiatry/Behavioral Medicine team recommendations.     Paulette Blanch, MD 11/06/14 [phone]

## 2014-11-06 NOTE — ED Notes (Signed)
Patient ate 100% of breakfast tray

## 2014-11-06 NOTE — Discharge Instructions (Signed)
Chest Pain (Nonspecific) °It is often hard to give a specific diagnosis for the cause of chest pain. There is always a chance that your pain could be related to something serious, such as a heart attack or a blood clot in the lungs. You need to follow up with your health care provider for further evaluation. °CAUSES  °· Heartburn. °· Pneumonia or bronchitis. °· Anxiety or stress. °· Inflammation around your heart (pericarditis) or lung (pleuritis or pleurisy). °· A blood clot in the lung. °· A collapsed lung (pneumothorax). It can develop suddenly on its own (spontaneous pneumothorax) or from trauma to the chest. °· Shingles infection (herpes zoster virus). °The chest wall is composed of bones, muscles, and cartilage. Any of these can be the source of the pain. °· The bones can be bruised by injury. °· The muscles or cartilage can be strained by coughing or overwork. °· The cartilage can be affected by inflammation and become sore (costochondritis). °DIAGNOSIS  °Lab tests or other studies may be needed to find the cause of your pain. Your health care provider may have you take a test called an ambulatory electrocardiogram (ECG). An ECG records your heartbeat patterns over a 24-hour period. You may also have other tests, such as: °· Transthoracic echocardiogram (TTE). During echocardiography, sound waves are used to evaluate how blood flows through your heart. °· Transesophageal echocardiogram (TEE). °· Cardiac monitoring. This allows your health care provider to monitor your heart rate and rhythm in real time. °· Holter monitor. This is a portable device that records your heartbeat and can help diagnose heart arrhythmias. It allows your health care provider to track your heart activity for several days, if needed. °· Stress tests by exercise or by giving medicine that makes the heart beat faster. °TREATMENT  °· Treatment depends on what may be causing your chest pain. Treatment may include: °· Acid blockers for  heartburn. °· Anti-inflammatory medicine. °· Pain medicine for inflammatory conditions. °· Antibiotics if an infection is present. °· You may be advised to change lifestyle habits. This includes stopping smoking and avoiding alcohol, caffeine, and chocolate. °· You may be advised to keep your head raised (elevated) when sleeping. This reduces the chance of acid going backward from your stomach into your esophagus. °Most of the time, nonspecific chest pain will improve within 2-3 days with rest and mild pain medicine.  °HOME CARE INSTRUCTIONS  °· If antibiotics were prescribed, take them as directed. Finish them even if you start to feel better. °· For the next few days, avoid physical activities that bring on chest pain. Continue physical activities as directed. °· Do not use any tobacco products, including cigarettes, chewing tobacco, or electronic cigarettes. °· Avoid drinking alcohol. °· Only take medicine as directed by your health care provider. °· Follow your health care provider's suggestions for further testing if your chest pain does not go away. °· Keep any follow-up appointments you made. If you do not go to an appointment, you could develop lasting (chronic) problems with pain. If there is any problem keeping an appointment, call to reschedule. °SEEK MEDICAL CARE IF:  °· Your chest pain does not go away, even after treatment. °· You have a rash with blisters on your chest. °· You have a fever. °SEEK IMMEDIATE MEDICAL CARE IF:  °· You have increased chest pain or pain that spreads to your arm, neck, jaw, back, or abdomen. °· You have shortness of breath. °· You have an increasing cough, or you cough   up blood.  You have severe back or abdominal pain.  You feel nauseous or vomit.  You have severe weakness.  You faint.  You have chills. This is an emergency. Do not wait to see if the pain will go away. Get medical help at once. Call your local emergency services (911 in [place] Do not drive  yourself to the hospital. MAKE SURE YOU:   Understand these instructions.  Will watch your condition.  Will get help right away if you are not doing well or get worse. Document Released: 11/22/2004 Document Revised: 02/17/2013 Document Reviewed: 09/18/2007 [facility] Patient Information 2015 [place], [place]. This information is not intended to replace advice given to you by your health care provider. Make sure you discuss any questions you have with your health care provider.  Depression Depression refers to feeling sad, low, down in the dumps, blue, gloomy, or empty. In general, there are two kinds of depression:  Normal sadness or normal grief. This kind of depression is one that we all feel from time to time after upsetting life experiences, such as the loss of a job or the ending of a relationship. This kind of depression is considered normal, is short lived, and resolves within a few days to 2 weeks. Depression experienced after the loss of a loved one (bereavement) often lasts longer than 2 weeks but normally gets better with time.  Clinical depression. This kind of depression lasts longer than normal sadness or normal grief or interferes with your ability to function at home, at work, and in school. It also interferes with your personal relationships. It affects almost every aspect of your life. Clinical depression is an illness. Symptoms of depression can also be caused by conditions other than those mentioned above, such as:  Physical illness. Some physical illnesses, including underactive thyroid gland (hypothyroidism), severe anemia, specific types of cancer, diabetes, uncontrolled seizures, heart and lung problems, strokes, and chronic pain are commonly associated with symptoms of depression.  Side effects of some prescription medicine. In some people, certain types of medicine can cause symptoms of depression.  Substance abuse. Abuse of alcohol and illicit drugs can cause symptoms  of depression. SYMPTOMS Symptoms of normal sadness and normal grief include the following:  Feeling sad or crying for short periods of time.  Not caring about anything (apathy).  Difficulty sleeping or sleeping too much.  No longer able to enjoy the things you used to enjoy.  Desire to be by oneself all the time (social isolation).  Lack of energy or motivation.  Difficulty concentrating or remembering.  Change in appetite or weight.  Restlessness or agitation. Symptoms of clinical depression include the same symptoms of normal sadness or normal grief and also the following symptoms:  Feeling sad or crying all the time.  Feelings of guilt or worthlessness.  Feelings of hopelessness or helplessness.  Thoughts of suicide or the desire to harm yourself (suicidal ideation).  Loss of touch with reality (psychotic symptoms). Seeing or hearing things that are not real (hallucinations) or having false beliefs about your life or the people around you (delusions and paranoia). DIAGNOSIS  The diagnosis of clinical depression is usually based on how bad the symptoms are and how long they have lasted. Your health care provider will also ask you questions about your medical history and substance use to find out if physical illness, use of prescription medicine, or substance abuse is causing your depression. Your health care provider may also order blood tests. TREATMENT  Often,  normal sadness and normal grief do not require treatment. However, sometimes antidepressant medicine is given for bereavement to ease the depressive symptoms until they resolve. The treatment for clinical depression depends on how bad the symptoms are but often includes antidepressant medicine, counseling with a mental health professional, or both. Your health care provider will help to determine what treatment is best for you. Depression caused by physical illness usually goes away with appropriate medical treatment of  the illness. If prescription medicine is causing depression, talk with your health care provider about stopping the medicine, decreasing the dose, or changing to another medicine. Depression caused by the abuse of alcohol or illicit drugs goes away when you stop using these substances. Some adults need professional help in order to stop drinking or using drugs. SEEK IMMEDIATE MEDICAL CARE IF:  You have thoughts about hurting yourself or others.  You lose touch with reality (have psychotic symptoms).  You are taking medicine for depression and have a serious side effect. FOR MORE INFORMATION  National Alliance on Mental Illness: www.nami.[employer] of Mental Health: https://carter.com/ Document Released: 02/10/2000 Document Revised: 06/29/2013 Document Reviewed: 05/14/2011 [facility] Patient Information 2015 [place], [place]. This information is not intended to replace advice given to you by your health care provider. Make sure you discuss any questions you have with your health care provider.  Suicidal Feelings, How to Help Yourself Everyone feels sad or unhappy at times, but depressing thoughts and feelings of hopelessness can lead to thoughts of suicide. It can seem as if life is too tough to handle. If you feel as though you have reached the point where suicide is the only answer, it is time to let someone know immediately.  HOW TO COPE AND PREVENT SUICIDE  Let family, friends, teachers, or counselors know. Get help. Try not to isolate yourself from those who care about you. Even though you may not feel sociable, talk with someone every day. It is best if it is face-to-face. Remember, they will want to help you.  Eat a regularly spaced and well-balanced diet.  Get plenty of rest.  Avoid alcohol and drugs because they will only make you feel worse and may also lower your inhibitions. Remove them from the home. If you are thinking of taking an overdose of your prescribed  medicines, give your medicines to someone who can give them to you one day at a time. If you are on antidepressants, let your caregiver know of your feelings so he or she can provide a safer medicine, if that is a concern.  Remove weapons or poisons from your home.  Try to stick to routines. Follow a schedule and remind yourself that you have to keep that schedule every day.  Set some realistic goals and achieve them. Make a list and cross things off as you go. Accomplishments give a sense of worth. Wait until you are feeling better before doing things you find difficult or unpleasant to do.  If you are able, try to start exercising. Even half-hour periods of exercise each day will make you feel better. Getting out in the sun or into nature helps you recover from depression faster. If you have a favorite place to walk, take advantage of that.  Increase safe activities that have always given you pleasure. This may include playing your favorite music, reading a good book, painting a picture, or playing your favorite instrument. Do whatever takes your mind off your depression.  Keep your living space well-lighted. GET  HELP Contact a suicide hotline, crisis center, or local suicide prevention center for help right away. Local centers may include a hospital, clinic, community service organization, social service provider, or health department.  Call your local emergency services (911 in the [place]).  Call a suicide hotline:  1-800-273-TALK (1-(615) 517-5712) in the [place].  1-800-SUICIDE 7340409021) in the [place].  212 357 0197 in the [place] for Spanish-speaking counselors.  3-646-803-2ZYY 418-172-4456) in the [place] for TTY users.  Visit the following websites for information and help:  National Suicide Prevention Lifeline: www.suicidepreventionlifeline.org  Hopeline: www.hopeline.[place] for Suicide Prevention:  PromotionalLoans.co.za  For lesbian, gay, bisexual, transgender, or questioning youth, contact [employer]:  8-891-6-X-IHWTUU 614-114-0074) in the [place].  www.thetrevorproject.org  In [place], treatment resources are listed in each [place] with listings available under USAA for [employer] or similar titles. Another source for Crisis Centres by [place] is located at http://www.suicideprevention.ca/in-crisis-now/find-a-crisis-centre-now/crisis-centres Document Released: 08/19/2002 Document Revised: 05/07/2011 Document Reviewed: 06/09/2013 [facility] Patient Information 2015 [place], [place]. This information is not intended to replace advice given to you by your health care provider. Make sure you discuss any questions you have with your health care provider.

## 2014-11-08 ENCOUNTER — Telehealth: Payer: Self-pay

## 2014-11-08 DIAGNOSIS — F3189 Other bipolar disorder: Secondary | ICD-10-CM | POA: Diagnosis not present

## 2014-11-08 NOTE — Telephone Encounter (Signed)
Patient returned my call and the message was reviewed. Patient stated that she will give her psychiatrist a call.

## 2014-11-08 NOTE — Telephone Encounter (Signed)
Per the request of Dr. Nadine Counts, I tried to contact this patient to inform her that she will have to contact her Psychiatrist in order to get something for her anxiety to help her cope with the passing of her mother, but she was not there. A message was left for her giving our condolences and letting her know that it will be better handled by her psychiatrist, but that if she needed [place] for anything else to give [place] a call

## 2014-11-09 DIAGNOSIS — F3189 Other bipolar disorder: Secondary | ICD-10-CM | POA: Diagnosis not present

## 2014-11-09 NOTE — Telephone Encounter (Signed)
Pt has appt 11-10-14

## 2014-11-10 ENCOUNTER — Ambulatory Visit: Payer: Commercial Managed Care - HMO | Admitting: Anesthesiology

## 2014-11-10 ENCOUNTER — Other Ambulatory Visit: Payer: Self-pay | Admitting: *Deleted

## 2014-11-10 DIAGNOSIS — F3189 Other bipolar disorder: Secondary | ICD-10-CM | POA: Diagnosis not present

## 2014-11-10 NOTE — Patient Outreach (Signed)
[facility]) Care Management  11/10/2014  Nicole Austin [birth year] [id number]   Follow up phone call to patient following her ED visit.  HIPPA compliant voicemail message left for a return call.    [name] [facility] Care Management [phone]

## 2014-11-11 DIAGNOSIS — F3189 Other bipolar disorder: Secondary | ICD-10-CM | POA: Diagnosis not present

## 2014-11-12 DIAGNOSIS — F3189 Other bipolar disorder: Secondary | ICD-10-CM | POA: Diagnosis not present

## 2014-11-12 DIAGNOSIS — F3181 Bipolar II disorder: Secondary | ICD-10-CM | POA: Diagnosis not present

## 2014-11-15 DIAGNOSIS — F3189 Other bipolar disorder: Secondary | ICD-10-CM | POA: Diagnosis not present

## 2014-11-16 ENCOUNTER — Ambulatory Visit (INDEPENDENT_AMBULATORY_CARE_PROVIDER_SITE_OTHER): Payer: Commercial Managed Care - HMO | Admitting: Family Medicine

## 2014-11-16 ENCOUNTER — Encounter: Payer: Self-pay | Admitting: Family Medicine

## 2014-11-16 ENCOUNTER — Other Ambulatory Visit: Payer: Self-pay | Admitting: *Deleted

## 2014-11-16 ENCOUNTER — Telehealth: Payer: Self-pay | Admitting: Anesthesiology

## 2014-11-16 VITALS — BP 130/84 | HR 101 | Temp 98.7°F | Resp 18 | Ht 61.0 in | Wt 238.6 lb

## 2014-11-16 DIAGNOSIS — F3189 Other bipolar disorder: Secondary | ICD-10-CM | POA: Diagnosis not present

## 2014-11-16 DIAGNOSIS — F319 Bipolar disorder, unspecified: Secondary | ICD-10-CM

## 2014-11-16 DIAGNOSIS — M62838 Other muscle spasm: Secondary | ICD-10-CM

## 2014-11-16 DIAGNOSIS — M549 Dorsalgia, unspecified: Secondary | ICD-10-CM | POA: Diagnosis not present

## 2014-11-16 DIAGNOSIS — R21 Rash and other nonspecific skin eruption: Secondary | ICD-10-CM

## 2014-11-16 DIAGNOSIS — G8929 Other chronic pain: Secondary | ICD-10-CM

## 2014-11-16 MED ORDER — PREDNISONE 10 MG (21) PO TBPK: ORAL_TABLET | ORAL | Status: DC

## 2014-11-16 MED ORDER — CYCLOBENZAPRINE HCL 5 MG PO TABS: 5.0000 mg | ORAL_TABLET | Freq: Three times a day (TID) | ORAL | Status: DC | PRN
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
## 2014-11-16 NOTE — Telephone Encounter (Signed)
Needs refill on medications / mother passed away / please call  Needs tramadol

## 2014-11-16 NOTE — Progress Notes (Signed)
Name: Nicole Austin   MRN: [id number]    DOB: 1960/12/16   Date:11/16/2014       Progress Note  Subjective  Chief Complaint  Chief Complaint  Patient presents with  . Spasms    need medication refills  . Rash    spreading for 2 days  . Anxiety    mother passed on 2014/11/06    HPI  Nicole Austin is here today for routine follow up. Patient reports that her mood is very sad, down and tearful since the death of her mother a few weeks ago. She states that she does have several social stressors as usual. [name] did go the ER with depressive moods but denies current suicidal thoughts or plans. In addition she has developed a rash on her nose, ears and back for a few days now she is not sure where it came from. Not painful, not pruritic. Denies exposure to new soaps, lotions. Not associated with fevers, chills or other sick contacts. Has not tried anything for the rash. Is requesting refill of Tramadol and muscle relaxer. She continues to complain of pain in multiple joints and lower extremity spasms. She is followed by [facility] for long history of multi-level spinal pain, sciatica, fibromyalgia all complicated by her frequently fluctuating mood disorder.  Active Ambulatory Problems    Diagnosis Date Noted  . Polysubstance abuse 07/04/2014  . Anxiety, generalized 11/24/2013  . Bipolar 1 disorder, depressed 08/13/2014  . Low back pain with sciatica 08/04/2014  . Back pain, chronic 08/13/2014  . Anxiety 08/16/2014  . Imbalance 11/24/2013  . Affective bipolar disorder 08/16/2014  . Blurred vision 11/24/2013  . COPD, moderate 11/18/2013  . Gout 08/16/2014  . H/O neoplasm 07/31/2011  . Cephalalgia 11/24/2013  . H/O gastrointestinal disease 08/16/2014  . HPV (human papilloma virus) infection 07/17/2013  . Extreme obesity 08/16/2014  . Arthritis of knee, degenerative 07/15/2013  . Compulsive tobacco user syndrome 08/04/2014  . Muscle spasms of both lower extremities 08/16/2014   . Burn of finger 09/23/2014  . Current tobacco use 08/04/2014  . Rash and nonspecific skin eruption 11/16/2014   Resolved Ambulatory Problems    Diagnosis Date Noted  . Acute CVA (cerebrovascular accident) 07/02/2014   Past Medical History  Diagnosis Date  . COPD (chronic obstructive pulmonary disease)   . PTSD (post-traumatic stress disorder)   . Rhabdomyolysis   . Bipolar 1 disorder   . Arthritis   . Stroke   . MI (myocardial infarction)   . Depression   . ADHD (attention deficit hyperactivity disorder)   . Asthma      Social History  Substance Use Topics  . Smoking status: Current Every Day Smoker -- 0.50 packs/day    Types: Cigarettes    Start date: 10/11/1984  . Smokeless tobacco: Never Used  . Alcohol Use: No     Current outpatient prescriptions:  .  albuterol (PROVENTIL HFA;VENTOLIN HFA) 108 (90 BASE) MCG/ACT inhaler, Inhale 1-2 puffs into the lungs every 6 (six) hours as needed for wheezing or shortness of breath., Disp: 6.7 g, Rfl: 2 .  ALPRAZolam (XANAX) 0.5 MG tablet, Take 1 tablet (0.5 mg total) by mouth 3 (three) times daily as needed for anxiety., Disp: 90 tablet, Rfl: 4 .  cyclobenzaprine (FLEXERIL) 5 MG tablet, Take 1 tablet (5 mg total) by mouth 3 (three) times daily as needed for muscle spasms., Disp: 90 tablet, Rfl: 2 .  divalproex (DEPAKOTE) 250 MG DR tablet, Take 250 mg by mouth  2 (two) times daily., Disp: , Rfl:  .  escitalopram (LEXAPRO) 20 MG tablet, Take 1 tablet (20 mg total) by mouth daily., Disp: 30 tablet, Rfl: 2 .  hydrOXYzine (VISTARIL) 25 MG capsule, 1-2 tablets twice daily as needed for anxiety. (Patient taking differently: Take 25-50 mg by mouth 2 (two) times daily as needed for anxiety. ), Disp: 120 capsule, Rfl: 0 .  lidocaine (XYLOCAINE) 5 % ointment, Apply 1 application topically as needed for mild pain., Disp: , Rfl:  .  LYRICA 200 MG capsule, Take 1 capsule (200 mg total) by mouth 2 (two) times daily., Disp: 60 capsule, Rfl: 5 .   Misc Natural Products (OSTEO BI-FLEX ADV TRIPLE ST) TABS, Take 1 tablet by mouth 2 (two) times daily., Disp: , Rfl:  .  Multiple Vitamin (MULTIVITAMIN WITH MINERALS) TABS tablet, Take 1 tablet by mouth daily., Disp: , Rfl:  .  QUEtiapine (SEROQUEL XR) 200 MG 24 hr tablet, Take 1 tablet (200 mg total) by mouth every evening., Disp: 20 tablet, Rfl: 3 .  traMADol (ULTRAM) 50 MG tablet, Take 1 tablet (50 mg total) by mouth 3 (three) times daily after meals. (Patient not taking: Reported on 11/16/2014), Disp: 42 tablet, Rfl: 0  Past Surgical History  Procedure Laterality Date  . Replacement total knee Left     Family History  Problem Relation Age of Onset  . Hernia Mother   . Heart disease Mother   . OCD Mother   . Diabetes Mother   . Parkinson's disease Father   . Bipolar disorder Sister   . Schizophrenia Sister   . ADD / ADHD Sister   . Alcohol abuse Brother   . Bipolar disorder Sister   . Paranoid behavior Sister   . ADD / ADHD Sister     Allergies  Allergen Reactions  . Codeine Nausea And Vomiting     Review of Systems  CONSTITUTIONAL: No significant weight changes, fever, chills, weakness or fatigue.  HEENT:  - Eyes: No visual changes.  - Ears: No auditory changes. No pain.  - Nose: No sneezing, congestion, runny nose. - Throat: No sore throat. No changes in swallowing. SKIN: Yes rash. CARDIOVASCULAR: No chest pain, chest pressure or chest discomfort. No palpitations or edema.  RESPIRATORY: No shortness of breath, cough or sputum.  NEUROLOGICAL: No headache, dizziness, syncope, paralysis, ataxia, numbness or tingling in the extremities. No memory changes. No change in bowel or bladder control.  MUSCULOSKELETAL: Chronic joint pain. Chronic muscle pain. HEMATOLOGIC: No anemia, bleeding or bruising.  LYMPHATICS: No enlarged lymph nodes.  PSYCHIATRIC: Yes change in mood. No change in sleep pattern.  ENDOCRINOLOGIC: No reports of sweating, cold or heat intolerance. No  polyuria or polydipsia.     Objective  BP 130/84 mmHg  Pulse 101  Temp(Src) 98.7 F (37.1 C) (Oral)  Resp 18  Ht 5' 1"  (1.549 m)  Wt 238 lb 9.6 oz (108.228 kg)  BMI 45.11 kg/m2  SpO2 95%  LMP  (LMP Unknown) Body mass index is 45.11 kg/(m^2).  Physical Exam  Constitutional: Patient appears obese, depressed and well-nourished. In no distress.  Cardiovascular: Normal rate, regular rhythm and normal heart sounds.  No murmur heard.  Pulmonary/Chest: Effort normal and breath sounds normal. No respiratory distress. Musculoskeletal: Normal range of motion bilateral UE and LE, no joint effusions. Peripheral vascular: Bilateral LE no edema.  Neurological: CN II-XII grossly intact with no focal deficits. Alert and oriented to person, place, and time. Coordination, balance, strength, speech and gait  are normal.  Skin: Skin is warm and dry. Scattered areas of redness to skin on tip of nose, at ear lobes without vesicles or pustules.  Psychiatric: Patient has a depressed tearful mood and affect. Behavior is stable in office today. She is very drowsy.    Recent Results (from the past 2160 hour(s))  Urine Drug Screen, Qualitative ([place] only)     Status: Abnormal   Collection Time: 11/05/14  4:20 PM  Result Value Ref Range   Tricyclic, Ur Screen POSITIVE (A) NONE DETECTED   Amphetamines, Ur Screen NONE DETECTED NONE DETECTED   MDMA (Ecstasy)Ur Screen NONE DETECTED NONE DETECTED   Cocaine Metabolite,Ur [place] NONE DETECTED NONE DETECTED   Opiate, Ur Screen NONE DETECTED NONE DETECTED   Phencyclidine (PCP) Ur S NONE DETECTED NONE DETECTED   Cannabinoid 50 Ng, Ur [place] NONE DETECTED NONE DETECTED   Barbiturates, Ur Screen NONE DETECTED NONE DETECTED   Benzodiazepine, Ur Scrn POSITIVE (A) NONE DETECTED   Methadone Scn, Ur NONE DETECTED NONE DETECTED    Comment: (NOTE) 852  Tricyclics, urine               Cutoff 1000 ng/mL 200  Amphetamines, urine             Cutoff 1000 ng/mL 300  MDMA (Ecstasy),  urine           Cutoff 500 ng/mL 400  Cocaine Metabolite, urine       Cutoff 300 ng/mL 500  Opiate, urine                   Cutoff 300 ng/mL 600  Phencyclidine (PCP), urine      Cutoff 25 ng/mL 700  Cannabinoid, urine              Cutoff 50 ng/mL 800  Barbiturates, urine             Cutoff 200 ng/mL 900  Benzodiazepine, urine           Cutoff 200 ng/mL 1000 Methadone, urine                Cutoff 300 ng/mL 1100 1200 The urine drug screen provides only a preliminary, unconfirmed 1300 analytical test result and should not be used for non-medical 1400 purposes. Clinical consideration and professional judgment should 1500 be applied to any positive drug screen result due to possible 1600 interfering substances. A more specific alternate chemical method 1700 must be used in order to obtain a confirmed analytical result.  1800 Gas chromato graphy / mass spectrometry (GC/MS) is the preferred 1900 confirmatory method.   CBC with Differential     Status: Abnormal   Collection Time: 11/05/14  4:28 PM  Result Value Ref Range   WBC 10.5 3.6 - 11.0 K/uL   RBC 4.45 3.80 - 5.20 MIL/uL   Hemoglobin 13.2 12.0 - 16.0 g/dL   HCT 40.0 35.0 - 47.0 %   MCV 89.9 80.0 - 100.0 fL   MCH 29.6 26.0 - 34.0 pg   MCHC 32.9 32.0 - 36.0 g/dL   RDW 16.1 (H) 11.5 - 14.5 %   Platelets 218 150 - 440 K/uL   Neutrophils Relative % 61 %   Neutro Abs 6.5 1.4 - 6.5 K/uL   Lymphocytes Relative 29 %   Lymphs Abs 3.0 1.0 - 3.6 K/uL   Monocytes Relative 8 %   Monocytes Absolute 0.8 0.2 - 0.9 K/uL   Eosinophils Relative 1 %   Eosinophils Absolute 0.1  0 - 0.7 K/uL   Basophils Relative 1 %   Basophils Absolute 0.1 0 - 0.1 K/uL  Comprehensive metabolic panel     Status: Abnormal   Collection Time: 11/05/14  4:28 PM  Result Value Ref Range   Sodium 137 135 - 145 mmol/L   Potassium 3.7 3.5 - 5.1 mmol/L   Chloride 100 (L) 101 - 111 mmol/L   CO2 28 22 - 32 mmol/L   Glucose, Bld 87 65 - 99 mg/dL   BUN 10 6 - 20 mg/dL    Creatinine, Ser 0.90 0.44 - 1.00 mg/dL   Calcium 8.8 (L) 8.9 - 10.3 mg/dL   Total Protein 6.6 6.5 - 8.1 g/dL   Albumin 3.7 3.5 - 5.0 g/dL   AST 24 15 - 41 U/L   ALT 26 14 - 54 U/L   Alkaline Phosphatase 89 38 - 126 U/L   Total Bilirubin 0.3 0.3 - 1.2 mg/dL   GFR calc non Af Amer >60 >60 mL/min   GFR calc Af Amer >60 >60 mL/min    Comment: (NOTE) The eGFR has been calculated using the CKD EPI equation. This calculation has not been validated in all clinical situations. eGFR's persistently <60 mL/min signify possible Chronic Kidney Disease.    Anion gap 9 5 - 15  Troponin I     Status: None   Collection Time: 11/05/14  4:28 PM  Result Value Ref Range   Troponin I <0.03 <0.031 ng/mL    Comment:        NO INDICATION OF MYOCARDIAL INJURY.   Fibrin derivatives D-Dimer ([facility] only)     Status: Abnormal   Collection Time: 11/05/14  4:28 PM  Result Value Ref Range   Fibrin derivatives D-dimer ([facility]) 1023.86 (H) 0 - 499    Comment: <> Exclusion of Venous Thromboembolism (VTE) - OUTPATIENTS ONLY        (Emergency Department or [place])             0-499 ng/ml (FEU)  : With a low to intermediate pretest                                        probability for VTE this test result                                        excludes the diagnosis of VTE.           > 499 ng/ml (FEU)  : VTE not excluded.  Additional work up                                   for VTE is required.   <>  Testing on Inpatients and Evaluation of Disseminated Intravascular        Coagulation ([facility]IC)             Reference Range:   0-499 ng/ml (FEU)   Acetaminophen level     Status: Abnormal   Collection Time: 11/05/14  4:28 PM  Result Value Ref Range   Acetaminophen (Tylenol), Serum <10 (L) 10 - 30 ug/mL    Comment:        THERAPEUTIC CONCENTRATIONS VARY SIGNIFICANTLY. A RANGE OF 10-30 ug/mL MAY BE AN  EFFECTIVE CONCENTRATION FOR MANY PATIENTS. HOWEVER, SOME ARE BEST TREATED AT CONCENTRATIONS OUTSIDE  THIS RANGE. ACETAMINOPHEN CONCENTRATIONS >150 ug/mL AT 4 HOURS AFTER INGESTION AND >50 ug/mL AT 12 HOURS AFTER INGESTION ARE OFTEN ASSOCIATED WITH TOXIC REACTIONS.   Salicylate level     Status: None   Collection Time: 11/05/14  4:28 PM  Result Value Ref Range   Salicylate Lvl <7.4 2.8 - 30.0 mg/dL  Ethanol     Status: None   Collection Time: 11/05/14  4:28 PM  Result Value Ref Range   Alcohol, Ethyl (B) <5 <5 mg/dL    Comment:        LOWEST DETECTABLE LIMIT FOR SERUM ALCOHOL IS 5 mg/dL FOR MEDICAL PURPOSES ONLY   Troponin I     Status: None   Collection Time: 11/05/14  7:35 PM  Result Value Ref Range   Troponin I <0.03 <0.031 ng/mL    Comment:        NO INDICATION OF MYOCARDIAL INJURY.      Assessment & Plan  1. Rash and nonspecific skin eruption If prednisone does not help she is instructed to call me for Derm referral.   - predniSONE (STERAPRED UNI-PAK 21 TAB) 10 MG (21) TBPK tablet; Use as directed in a 6 day taper predpak  Dispense: 21 tablet; Refill: 0  2. Muscle spasms of both lower extremities Refilled.  - cyclobenzaprine (FLEXERIL) 5 MG tablet; Take 1 tablet (5 mg total) by mouth 3 (three) times daily as needed for muscle spasms.  Dispense: 90 tablet; Refill: 5  3. Back pain, chronic Needs to follow up with pain management for tramadol refills.   4. Bipolar 1 disorder, depressed Exacerbation since mother passed, she is to follow up with Psychiatrist this week.

## 2014-11-17 ENCOUNTER — Other Ambulatory Visit: Payer: Self-pay | Admitting: *Deleted

## 2014-11-17 DIAGNOSIS — F3189 Other bipolar disorder: Secondary | ICD-10-CM | POA: Diagnosis not present

## 2014-11-17 NOTE — Telephone Encounter (Signed)
Scheduled for appt 11-24-14 at [facility]

## 2014-11-17 NOTE — Patient Outreach (Signed)
[facility]) Care Management  11/17/2014  [name] 11/24/1960 [id number]   Phone call to patient to follow up on emergency room stay.  Patient states that she has been diagnosed with `Hives and has been prescribed Flexeril.  Patient further states that she has an appointment to see her Psychiatrist on 11/18/14 at 1:45pm.   Patient discussed that she was also in the process of being evicted due to missing several required inspections of her home.  Per patient, she did not receive the notices.  Per patient, she has an informal hearing at the [employer] on Friday, 11/19/14.  She has contacted legal counsel through legal aid, Martyn Ehrich [phone] who has helped her in the past and has requested that he attend this hearing with her.  This social worker was also given verbal permission to contact her attorney through legal aid.  Voicemail message left for a return call.  Patient to be referred to the [employer] program through [facility].    [name] [facility] Care Management [phone]

## 2014-11-18 ENCOUNTER — Other Ambulatory Visit: Payer: Self-pay | Admitting: *Deleted

## 2014-11-18 ENCOUNTER — Encounter: Payer: Self-pay | Admitting: Psychiatry

## 2014-11-18 ENCOUNTER — Ambulatory Visit (INDEPENDENT_AMBULATORY_CARE_PROVIDER_SITE_OTHER): Payer: Commercial Managed Care - HMO | Admitting: Psychiatry

## 2014-11-18 VITALS — BP 124/86 | HR 116 | Temp 99.1°F | Ht 61.0 in | Wt 240.0 lb

## 2014-11-18 DIAGNOSIS — F3189 Other bipolar disorder: Secondary | ICD-10-CM | POA: Diagnosis not present

## 2014-11-18 DIAGNOSIS — F4310 Post-traumatic stress disorder, unspecified: Secondary | ICD-10-CM

## 2014-11-18 DIAGNOSIS — F3176 Bipolar disorder, in full remission, most recent episode depressed: Secondary | ICD-10-CM

## 2014-11-18 MED ORDER — ESCITALOPRAM OXALATE 20 MG PO TABS: 20.0000 mg | ORAL_TABLET | Freq: Every day | ORAL | Status: DC

## 2014-11-18 MED ORDER — DIVALPROEX SODIUM 250 MG PO DR TAB: 250.0000 mg | DELAYED_RELEASE_TABLET | Freq: Two times a day (BID) | ORAL | Status: DC

## 2014-11-18 MED ORDER — QUETIAPINE FUMARATE ER 300 MG PO TB24
300.0000 mg | ORAL_TABLET | Freq: Every evening | ORAL | Status: DC
Start: 2014-11-18 — End: 2015-01-13

## 2014-11-18 MED ORDER — ALPRAZOLAM 1 MG PO TABS: 1.0000 mg | ORAL_TABLET | Freq: Three times a day (TID) | ORAL | Status: DC | PRN

## 2014-11-18 NOTE — Progress Notes (Signed)
BH MD/PA/NP OP Progress Note  11/18/2014 2:23 PM Nicole Austin  MRN:  [id number]  Subjective:  Patient presents to the appointment in 2 years. She indicates that the reason is that her mother died. She states she did not get to say goodbye. While saying that she does put her bag of medications. She does make comments about some medications that this writer does not prescribe such as tramadol. She states that they're not doing anything for her.  I did discuss with her the grieving process and that some of her frustration and sadness is a normal response. I did offer to try to facilitate getting her into grief counseling. Patient declines this stating that she is too anxious to enroll in that. She seemed to brighten slightly when I discussed that we could adjust her medications.  She describes a good support social support system. She describes one friend that his social support and another friend that is living with her and is a good social support and facilitated getting her to the appointment today. When we discussed any issues related to suicidal thoughts. Patient was adamant that she did not have any such intent or plan for this because she has faith in the belief that suicide would prevent her from seeing her mother.  Chief Complaint: My mother died Chief Complaint    Follow-up; Medication Refill; Depression; Stress; Insomnia     Visit Diagnosis:     ICD-9-CM ICD-10-CM   1. Bipolar disorder, in full remission, most recent episode depressed 296.56 F31.76 QUEtiapine (SEROQUEL XR) 300 MG 24 hr tablet     escitalopram (LEXAPRO) 20 MG tablet  2. PTSD (post-traumatic stress disorder) 309.81 F43.10 ALPRAZolam (XANAX) 1 MG tablet     escitalopram (LEXAPRO) 20 MG tablet    Past Medical History:  Past Medical History  Diagnosis Date  . COPD (chronic obstructive pulmonary disease)   . PTSD (post-traumatic stress disorder)   . Rhabdomyolysis   . Bipolar 1 disorder   . Arthritis   .  Stroke   . MI (myocardial infarction)   . Depression   . ADHD (attention deficit hyperactivity disorder)   . Asthma     Past Surgical History  Procedure Laterality Date  . Replacement total knee Left    Family History:  Family History  Problem Relation Age of Onset  . Hernia Mother   . Heart disease Mother   . OCD Mother   . Diabetes Mother   . Parkinson's disease Father   . Bipolar disorder Sister   . Schizophrenia Sister   . ADD / ADHD Sister   . Alcohol abuse Brother   . Bipolar disorder Sister   . Paranoid behavior Sister   . ADD / ADHD Sister    Social History:  Social History   Social History  . Marital Status: Divorced    Spouse Name: N/A  . Number of Children: N/A  . Years of Education: N/A   Social History Main Topics  . Smoking status: Current Every Day Smoker -- 0.50 packs/day    Types: Cigarettes    Start date: 10/11/1984  . Smokeless tobacco: Never Used  . Alcohol Use: No  . Drug Use: No  . Sexual Activity: No   Other Topics Concern  . None   Social History Narrative   Additional History:   Assessment:   Musculoskeletal: Strength & Muscle Tone: within normal limits Gait & Station: normal Patient leans: N/A  Psychiatric Specialty Exam: Depression  Associated symptoms include insomnia.  Associated symptoms include no suicidal ideas.  Past medical history includes anxiety.   Insomnia PMH includes: depression.  Anxiety Symptoms include insomnia. Patient reports no nervous/anxious behavior or suicidal ideas.      Review of Systems  Psychiatric/Behavioral: Positive for depression. Negative for suicidal ideas, hallucinations, memory loss and substance abuse. The patient has insomnia. The patient is not nervous/anxious.     Blood pressure 124/86, pulse 116, temperature 99.1 F (37.3 C), temperature source Tympanic, height 5\' 1"  (1.549 m), weight 240 lb (108.863 kg), SpO2 95 %.Body mass index is 45.37 kg/(m^2).  General Appearance:  Well Groomed  Eye Contact:  Good  Speech:  Clear and Coherent and Normal Rate  Volume:  Normal  Mood:  terrible  Affect:  Tearful  Thought Process:  Linear  Orientation:  Full (Time, Place, and Person)  Thought Content:  Negative  Suicidal Thoughts:  No  Homicidal Thoughts:  No  Memory:  Immediate;   Good Recent;   Good Remote;   Good  Judgement:  Good  Insight:  Good  Psychomotor Activity:  Negative  Concentration:  Good  Recall:  Good  Fund of Knowledge: Good  Language: Good  Akathisia:  Negative  Handed:  Right unknown  AIMS (if indicated):  Not done  Assets:  Communication Skills Housing Social Support  ADL's:  Intact  Cognition: WNL  Sleep:  Good   Is the patient at risk to self?  No. Has the patient been a risk to self in the past 6 months?  No. Has the patient been a risk to self within the distant past?  Yes.   Is the patient a risk to others?  No. Has the patient been a risk to others in the past 6 months?  No. Has the patient been a risk to others within the distant past?  No.  Current Medications: Current Outpatient Prescriptions  Medication Sig Dispense Refill  . albuterol (PROVENTIL HFA;VENTOLIN HFA) 108 (90 BASE) MCG/ACT inhaler Inhale 1-2 puffs into the lungs every 6 (six) hours as needed for wheezing or shortness of breath. 6.7 g 2  . ALPRAZolam (XANAX) 1 MG tablet Take 1 tablet (1 mg total) by mouth 3 (three) times daily as needed for anxiety. 90 tablet 1  . cyclobenzaprine (FLEXERIL) 5 MG tablet Take 1 tablet (5 mg total) by mouth 3 (three) times daily as needed for muscle spasms. 90 tablet 5  . divalproex (DEPAKOTE) 250 MG DR tablet Take 1 tablet (250 mg total) by mouth 2 (two) times daily. 60 tablet 1  . escitalopram (LEXAPRO) 20 MG tablet Take 1 tablet (20 mg total) by mouth daily. 30 tablet 2  . hydrOXYzine (VISTARIL) 25 MG capsule 1-2 tablets twice daily as needed for anxiety. (Patient taking differently: Take 25-50 mg by mouth 2 (two) times daily  as needed for anxiety. ) 120 capsule 0  . lidocaine (XYLOCAINE) 5 % ointment Apply 1 application topically as needed for mild pain.    Marland Kitchen LYRICA 200 MG capsule Take 1 capsule (200 mg total) by mouth 2 (two) times daily. 60 capsule 5  . Misc Natural Products (OSTEO BI-FLEX ADV TRIPLE ST) TABS Take 1 tablet by mouth 2 (two) times daily.    . Multiple Vitamin (MULTIVITAMIN WITH MINERALS) TABS tablet Take 1 tablet by mouth daily.    . predniSONE (STERAPRED UNI-PAK 21 TAB) 10 MG (21) TBPK tablet Use as directed in a 6 day taper predpak 21 tablet 0  .  QUEtiapine (SEROQUEL XR) 300 MG 24 hr tablet Take 1 tablet (300 mg total) by mouth every evening. 30 tablet 1  . traMADol (ULTRAM) 50 MG tablet Take 1 tablet (50 mg total) by mouth 3 (three) times daily after meals. 42 tablet 0   No current facility-administered medications for this visit.    Medical Decision Making:  Established Problem, Stable/Improving (1) and Review or order clinical lab tests (1)  Treatment Plan Summary:Medication management  Bipolar disorder-We will increase her Seroquel XR to 300 mg at bedtime. We'll increase her alprazolam to 1 mg 3 times a day as needed., Continue her Lexapro 20 mg a day. She is on Depakote 250 mg twice a day prescribed by her neurologist. She has been stable on this regimen and thus she will follow up in 2 weeks. She's been encouraged call any questions or concerns prior to her next appointment.  Bereavement-patient does have some social supports. She was offered a referral to grief counseling but declines this at this time.   PTSD-stable. Will continue the Lexapro and Seroquel.  Faith Rogue 11/18/2014, 2:23 PM

## 2014-11-18 NOTE — Patient Outreach (Signed)
[facility]) Care Management  11/18/2014  Nicole Austin 04-Oct-1960 [id number]  Phone call to patient to follow up on eviction hearing and if she was able to get in touch with legal aid.  Voicemail message left for a return call.   [name] [facility] Care Management [phone]

## 2014-11-19 ENCOUNTER — Telehealth: Payer: Self-pay | Admitting: Anesthesiology

## 2014-11-19 ENCOUNTER — Other Ambulatory Visit: Payer: Self-pay | Admitting: *Deleted

## 2014-11-19 DIAGNOSIS — F3189 Other bipolar disorder: Secondary | ICD-10-CM | POA: Diagnosis not present

## 2014-11-19 NOTE — Patient Outreach (Signed)
[place] [facility]) Care Management  11/19/2014  Lakea Mittelman [place] 1960-05-10 [id number]  Phone call to patient to follow up on her court hearing at [employer].  Per patient, [place] completed their inspection and needed repairs were completed.  Patient now has an inspection scheduled for 11/25/14 with an exterminator.  Patient will have to have all her furniture moved from the wall in order for the inspection to be completed.  Patient plans to have her friends, neighbors and  personal care aid to assist with moving her furniture so that the inspection can be done.  After the inspection, she will be notified by [employer] if eviction proceedings will continue or not.  Patient declined referral to grief counselor, however agrees to a behavioral referral  where she would be contacted by a licensed therapist by phone which is a benefit of her [employer]. Behavioral Referral completed today.  Per Judeen Hammans, she should be able to reach out to patient this afternoon.    [name] [facility] Care Management [phone]

## 2014-11-19 NOTE — Telephone Encounter (Signed)
Spoke with Dr. Idelia Salm, asked if pt can be seen today. Per Dr.Parris, pt cannto be seen today. Patient notified.

## 2014-11-19 NOTE — Telephone Encounter (Signed)
Having extreme pain / cannot stand it until Wed appt / needs medications called in or something

## 2014-11-22 DIAGNOSIS — F3189 Other bipolar disorder: Secondary | ICD-10-CM | POA: Diagnosis not present

## 2014-11-23 DIAGNOSIS — F3189 Other bipolar disorder: Secondary | ICD-10-CM | POA: Diagnosis not present

## 2014-11-24 ENCOUNTER — Encounter: Payer: Self-pay | Admitting: Anesthesiology

## 2014-11-24 ENCOUNTER — Ambulatory Visit: Payer: Commercial Managed Care - HMO | Attending: Anesthesiology | Admitting: Anesthesiology

## 2014-11-24 VITALS — BP 118/56 | HR 89 | Temp 98.0°F | Resp 20 | Ht 61.0 in | Wt 245.0 lb

## 2014-11-24 DIAGNOSIS — F1190 Opioid use, unspecified, uncomplicated: Secondary | ICD-10-CM | POA: Diagnosis not present

## 2014-11-24 DIAGNOSIS — M5136 Other intervertebral disc degeneration, lumbar region: Secondary | ICD-10-CM | POA: Diagnosis not present

## 2014-11-24 DIAGNOSIS — Z7289 Other problems related to lifestyle: Secondary | ICD-10-CM | POA: Diagnosis not present

## 2014-11-24 DIAGNOSIS — M5441 Lumbago with sciatica, right side: Secondary | ICD-10-CM

## 2014-11-24 DIAGNOSIS — M47816 Spondylosis without myelopathy or radiculopathy, lumbar region: Secondary | ICD-10-CM | POA: Diagnosis not present

## 2014-11-24 DIAGNOSIS — G8929 Other chronic pain: Secondary | ICD-10-CM | POA: Insufficient documentation

## 2014-11-24 DIAGNOSIS — F3189 Other bipolar disorder: Secondary | ICD-10-CM | POA: Diagnosis not present

## 2014-11-24 DIAGNOSIS — M5442 Lumbago with sciatica, left side: Secondary | ICD-10-CM | POA: Diagnosis not present

## 2014-11-24 DIAGNOSIS — F1910 Other psychoactive substance abuse, uncomplicated: Secondary | ICD-10-CM

## 2014-11-24 DIAGNOSIS — F319 Bipolar disorder, unspecified: Secondary | ICD-10-CM

## 2014-11-24 DIAGNOSIS — G894 Chronic pain syndrome: Secondary | ICD-10-CM | POA: Diagnosis not present

## 2014-11-24 DIAGNOSIS — M545 Low back pain: Secondary | ICD-10-CM | POA: Insufficient documentation

## 2014-11-24 DIAGNOSIS — Z79891 Long term (current) use of opiate analgesic: Secondary | ICD-10-CM | POA: Diagnosis not present

## 2014-11-24 DIAGNOSIS — M62838 Other muscle spasm: Secondary | ICD-10-CM

## 2014-11-24 MED ORDER — TRAMADOL HCL 50 MG PO TABS: 50.0000 mg | ORAL_TABLET | Freq: Three times a day (TID) | ORAL | Status: DC

## 2014-11-24 MED ORDER — CYCLOBENZAPRINE HCL 5 MG PO TABS: 10.0000 mg | ORAL_TABLET | Freq: Three times a day (TID) | ORAL | Status: DC | PRN

## 2014-11-24 NOTE — Progress Notes (Signed)
Safety precautions to be maintained throughout the outpatient stay will include: orient to surroundings, keep bed in low position, maintain call bell within reach at all times, provide assistance with transfer out of bed and ambulation.  

## 2014-11-24 NOTE — Progress Notes (Signed)
   Subjective:    Patient ID: Nicole Austin, female    DOB: May 15, 1960, 54 y.o.   MRN: [id number] This patient continues to have low back pain She demonstrates pain dramatization and some significant pain behavior She recently lost her mother and is having significant grief issues associated with that loss She also has a long-standing history of chronic opioid use and misuse and she is demonstrating some drug seeking behavior HPI    Review of Systems  Constitutional: Negative.   HENT: Negative.   Eyes: Negative.   Respiratory: Negative.   Cardiovascular: Negative.   Gastrointestinal: Negative.   Genitourinary: Negative.   Allergic/Immunologic: Negative.   Neurological: Negative.   Hematological: Negative.   Psychiatric/Behavioral:       Patient is demonstrating some drug seeking behavior and is also showing some significant pain exaggeration and pain dramatization Notwithstanding she had a loss in her family and has some grief issues associated with her other symptoms   Physical examination She continues to have tenderness in her lower lumbar area Range of motion is significantly decreased in both lower extremities Straight-leg raising test on the left side is 50 Straight leg raising test on the right side is 70 Torsion test is positive  Her blood pressure is 118/56 mmHg  Her pulse is 89 bpm Equal and regular  Heart sounds 1 and 2 were heard in all areas and there were no audible murmurs   Temperature is 90.108F  Chest is clinically clear and there are no adventitious sounds   Abdomen is soft and nontender and there is no palpable organomegaly No significant lymphadenopathy  Pupils are equal and reactive There are no new positive physical findings       Objective:   Physical Exam        Assessment & Plan:   Assessment 1 chronic low back pain 2 lumbar degenerative disc disease 3 bipolar disorder 4 chronic opioid use and misuse  Plan of  management 1 Will continue to keep her on trazodone 50 mg 3 times a day 2 Will will increase her Flexeril to 10 mg 3 times a day 3 plan a caudal epidural steroid injection for her in the next few days   Established patient   level III   Lance Bosch M.D.

## 2014-11-24 NOTE — Patient Instructions (Signed)
You were given a script for Tramadol today.  A script for Cyclobenzaprine was sent to your pharmacy. GENERAL RISKS AND COMPLICATIONS  What are the risk, side effects and possible complications? Generally speaking, most procedures are safe.  However, with any procedure there are risks, side effects, and the possibility of complications.  The risks and complications are dependent upon the sites that are lesioned, or the type of nerve block to be performed.  The closer the procedure is to the spine, the more serious the risks are.  Great care is taken when placing the radio frequency needles, block needles or lesioning probes, but sometimes complications can occur. 1. Infection: Any time there is an injection through the skin, there is a risk of infection.  This is why sterile conditions are used for these blocks.  There are four possible types of infection. 1. Localized skin infection. 2. Central Nervous System Infection-This can be in the form of Meningitis, which can be deadly. 3. Epidural Infections-This can be in the form of an epidural abscess, which can cause pressure inside of the spine, causing compression of the spinal cord with subsequent paralysis. This would require an emergency surgery to decompress, and there are no guarantees that the patient would recover from the paralysis. 4. Discitis-This is an infection of the intervertebral discs.  It occurs in about 1% of discography procedures.  It is difficult to treat and it may lead to surgery.        2. Pain: the needles have to go through skin and soft tissues, will cause soreness.       3. Damage to internal structures:  The nerves to be lesioned may be near blood vessels or    other nerves which can be potentially damaged.       4. Bleeding: Bleeding is more common if the patient is taking blood thinners such as  aspirin, Coumadin, Ticiid, Plavix, etc., or if he/she have some genetic predisposition  such as hemophilia. Bleeding into the  spinal canal can cause compression of the spinal  cord with subsequent paralysis.  This would require an emergency surgery to  decompress and there are no guarantees that the patient would recover from the  paralysis.       5. Pneumothorax:  Puncturing of a lung is a possibility, every time a needle is introduced in  the area of the chest or upper back.  Pneumothorax refers to free air around the  collapsed lung(s), inside of the thoracic cavity (chest cavity).  Another two possible  complications related to a similar event would include: Hemothorax and Chylothorax.   These are variations of the Pneumothorax, where instead of air around the collapsed  lung(s), you may have blood or chyle, respectively.       6. Spinal headaches: They may occur with any procedures in the area of the spine.       7. Persistent CSF (Cerebro-Spinal Fluid) leakage: This is a rare problem, but may occur  with prolonged intrathecal or epidural catheters either due to the formation of a fistulous  track or a dural tear.       8. Nerve damage: By working so close to the spinal cord, there is always a possibility of  nerve damage, which could be as serious as a permanent spinal cord injury with  paralysis.       9. Death:  Although rare, severe deadly allergic reactions known as "Anaphylactic  reaction" can occur to any of the medications  used.      10. Worsening of the symptoms:  We can always make thing worse.  What are the chances of something like this happening? Chances of any of this occuring are extremely low.  By statistics, you have more of a chance of getting killed in a motor vehicle accident: while driving to the hospital than any of the above occurring .  Nevertheless, you should be aware that they are possibilities.  In general, it is similar to taking a shower.  Everybody knows that you can slip, hit your head and get killed.  Does that mean that you should not shower again?  Nevertheless always keep in mind that  statistics do not mean anything if you happen to be on the wrong side of them.  Even if a procedure has a 1 (one) in a 1,000,000 (million) chance of going wrong, it you happen to be that one..Also, keep in mind that by statistics, you have more of a chance of having something go wrong when taking medications.  Who should not have this procedure? If you are on a blood thinning medication (e.g. Coumadin, Plavix, see list of "Blood Thinners"), or if you have an active infection going on, you should not have the procedure.  If you are taking any blood thinners, please inform your physician.  How should I prepare for this procedure?  Do not eat or drink anything at least six hours prior to the procedure.  Bring a driver with you .  It cannot be a taxi.  Come accompanied by an adult that can drive you back, and that is strong enough to help you if your legs get weak or numb from the local anesthetic.  Take all of your medicines the morning of the procedure with just enough water to swallow them.  If you have diabetes, make sure that you are scheduled to have your procedure done first thing in the morning, whenever possible.  If you have diabetes, take only half of your insulin dose and notify our nurse that you have done so as soon as you arrive at the clinic.  If you are diabetic, but only take blood sugar pills (oral hypoglycemic), then do not take them on the morning of your procedure.  You may take them after you have had the procedure.  Do not take aspirin or any aspirin-containing medications, at least eleven (11) days prior to the procedure.  They may prolong bleeding.  Wear loose fitting clothing that may be easy to take off and that you would not mind if it got stained with Betadine or blood.  Do not wear any jewelry or perfume  Remove any nail coloring.  It will interfere with some of our monitoring equipment.  NOTE: Remember that this is not meant to be interpreted as a complete  list of all possible complications.  Unforeseen problems may occur.  BLOOD THINNERS The following drugs contain aspirin or other products, which can cause increased bleeding during surgery and should not be taken for 2 weeks prior to and 1 week after surgery.  If you should need take something for relief of minor pain, you may take acetaminophen which is found in Tylenol,m Datril, Anacin-3 and Panadol. It is not blood thinner. The products listed below are.  Do not take any of the products listed below in addition to any listed on your instruction sheet.  A.P.C or A.P.C with Codeine Codeine Phosphate Capsules #3 Ibuprofen Ridaura  ABC compound Congesprin Imuran rimadil  Advil Cope Indocin Robaxisal  Alka-Seltzer Effervescent Pain Reliever and Antacid Coricidin or Coricidin-D  Indomethacin Rufen  Alka-Seltzer plus Cold Medicine Cosprin Ketoprofen S-A-C Tablets  Anacin Analgesic Tablets or Capsules Coumadin Korlgesic Salflex  Anacin Extra Strength Analgesic tablets or capsules CP-2 Tablets Lanoril Salicylate  Anaprox Cuprimine Capsules Levenox Salocol  Anexsia-D Dalteparin Magan Salsalate  Anodynos Darvon compound Magnesium Salicylate Sine-off  Ansaid Dasin Capsules Magsal Sodium Salicylate  Anturane Depen Capsules Marnal Soma  APF Arthritis pain formula Dewitt's Pills Measurin Stanback  Argesic Dia-Gesic Meclofenamic Sulfinpyrazone  Arthritis Bayer Timed Release Aspirin Diclofenac Meclomen Sulindac  Arthritis pain formula Anacin Dicumarol Medipren Supac  Analgesic (Safety coated) Arthralgen Diffunasal Mefanamic Suprofen  Arthritis Strength Bufferin Dihydrocodeine Mepro Compound Suprol  Arthropan liquid Dopirydamole Methcarbomol with Aspirin Synalgos  ASA tablets/Enseals Disalcid Micrainin Tagament  Ascriptin Doan's Midol Talwin  Ascriptin A/D Dolene Mobidin Tanderil  Ascriptin Extra Strength Dolobid Moblgesic Ticlid  Ascriptin with Codeine Doloprin or Doloprin with Codeine Momentum  Tolectin  Asperbuf Duoprin Mono-gesic Trendar  Aspergum Duradyne Motrin or Motrin IB Triminicin  Aspirin plain, buffered or enteric coated Durasal Myochrisine Trigesic  Aspirin Suppositories Easprin Nalfon Trillsate  Aspirin with Codeine Ecotrin Regular or Extra Strength Naprosyn Uracel  Atromid-S Efficin Naproxen Ursinus  Auranofin Capsules Elmiron Neocylate Vanquish  Axotal Emagrin Norgesic Verin  Azathioprine Empirin or Empirin with Codeine Normiflo Vitamin E  Azolid Emprazil Nuprin Voltaren  Bayer Aspirin plain, buffered or children's or timed BC Tablets or powders Encaprin Orgaran Warfarin Sodium  Buff-a-Comp Enoxaparin Orudis Zorpin  Buff-a-Comp with Codeine Equegesic Os-Cal-Gesic   Buffaprin Excedrin plain, buffered or Extra Strength Oxalid   Bufferin Arthritis Strength Feldene Oxphenbutazone   Bufferin plain or Extra Strength Feldene Capsules Oxycodone with Aspirin   Bufferin with Codeine Fenoprofen Fenoprofen Pabalate or Pabalate-SF   Buffets II Flogesic Panagesic   Buffinol plain or Extra Strength Florinal or Florinal with Codeine Panwarfarin   Buf-Tabs Flurbiprofen Penicillamine   Butalbital Compound Four-way cold tablets Penicillin   Butazolidin Fragmin Pepto-Bismol   Carbenicillin Geminisyn Percodan   Carna Arthritis Reliever Geopen Persantine   Carprofen Gold's salt Persistin   Chloramphenicol Goody's Phenylbutazone   Chloromycetin Haltrain Piroxlcam   Clmetidine heparin Plaquenil   Cllnoril Hyco-pap Ponstel   Clofibrate Hydroxy chloroquine Propoxyphen         Before stopping any of these medications, be sure to consult the physician who ordered them.  Some, such as Coumadin (Warfarin) are ordered to prevent or treat serious conditions such as "deep thrombosis", "pumonary embolisms", and other heart problems.  The amount of time that you may need off of the medication may also vary with the medication and the reason for which you were taking it.  If you are taking any  of these medications, please make sure you notify your pain physician before you undergo any procedures.         Epidural Steroid Injection Patient Information  Description: The epidural space surrounds the nerves as they exit the spinal cord.  In some patients, the nerves can be compressed and inflamed by a bulging disc or a tight spinal canal (spinal stenosis).  By injecting steroids into the epidural space, we can bring irritated nerves into direct contact with a potentially helpful medication.  These steroids act directly on the irritated nerves and can reduce swelling and inflammation which often leads to decreased pain.  Epidural steroids may be injected anywhere along the spine and from the neck to the low back depending upon the location of  your pain.   After numbing the skin with local anesthetic (like Novocaine), a small needle is passed into the epidural space slowly.  You may experience a sensation of pressure while this is being done.  The entire block usually last less than 10 minutes.  Conditions which may be treated by epidural steroids:   Low back and leg pain  Neck and arm pain  Spinal stenosis  Post-laminectomy syndrome  Herpes zoster (shingles) pain  Pain from compression fractures  Preparation for the injection:  1. Do not eat any solid food or dairy products within 6 hours of your appointment.  2. You may drink clear liquids up to 2 hours before appointment.  Clear liquids include water, black coffee, juice or soda.  No milk or cream please. 3. You may take your regular medication, including pain medications, with a sip of water before your appointment  Diabetics should hold regular insulin (if taken separately) and take 1/2 normal NPH dos the morning of the procedure.  Carry some sugar containing items with you to your appointment. 4. A driver must accompany you and be prepared to drive you home after your procedure.  5. Bring all your current medications  with your. 6. An IV may be inserted and sedation may be given at the discretion of the physician.   7. A blood pressure cuff, EKG and other monitors will often be applied during the procedure.  Some patients may need to have extra oxygen administered for a short period. 8. You will be asked to provide medical information, including your allergies, prior to the procedure.  We must know immediately if you are taking blood thinners (like Coumadin/Warfarin)  Or if you are allergic to IV iodine contrast (dye). We must know if you could possible be pregnant.  Possible side-effects:  Bleeding from needle site  Infection (rare, may require surgery)  Nerve injury (rare)  Numbness & tingling (temporary)  Difficulty urinating (rare, temporary)  Spinal headache ( a headache worse with upright posture)  Light -headedness (temporary)  Pain at injection site (several days)  Decreased blood pressure (temporary)  Weakness in arm/leg (temporary)  Pressure sensation in back/neck (temporary)  Call if you experience:  Fever/chills associated with headache or increased back/neck pain.  Headache worsened by an upright position.  New onset weakness or numbness of an extremity below the injection site  Hives or difficulty breathing (go to the emergency room)  Inflammation or drainage at the infection site  Severe back/neck pain  Any new symptoms which are concerning to you  Please note:  Although the local anesthetic injected can often make your back or neck feel good for several hours after the injection, the pain will likely return.  It takes 3-7 days for steroids to work in the epidural space.  You may not notice any pain relief for at least that one week.  If effective, we will often do a series of three injections spaced 3-6 weeks apart to maximally decrease your pain.  After the initial series, we generally will wait several months before considering a repeat injection of the same  type.  If you have any questions, please call [phone] [facility]

## 2014-11-25 ENCOUNTER — Telehealth: Payer: Self-pay | Admitting: Family Medicine

## 2014-11-25 NOTE — Telephone Encounter (Signed)
Tried to contact [place] to talk to her regarding the orthosis she was trying to get for this patient. It is not listed in out notes where we requested these items nor was it mentioned by this patient that she needed these items so we were trying to get clarity on how this process was initiated, but there was no answer so a message was left for someone to call us when they got the chance.

## 2014-11-25 NOTE — Telephone Encounter (Signed)
Nicole [facility] from [facility] called about paperwork for her back,knee and ankle brace. She states they have not received the forms and would like a call back.

## 2014-11-26 ENCOUNTER — Other Ambulatory Visit: Payer: Self-pay | Admitting: *Deleted

## 2014-11-26 DIAGNOSIS — F3189 Other bipolar disorder: Secondary | ICD-10-CM | POA: Diagnosis not present

## 2014-11-26 NOTE — Patient Outreach (Signed)
RNCM received a call back from pt after RNCM's call. Pt made RNCM aware her mother's service had been moved to a later date in October. Pt stating she was dealing with the grief as well as could be expected, but was really having a hard time with her back pain. Pt explained her left foot was " really swollen" and her right foot was only slightly swollen. Pt stated she could not get her shoes on her feet. Pt denies any hot red areas to legs. Pt denies fall or injury to legs. Pt stated she had been in touch with her pain MD this week and he ordered her Tramadol 50 mg, which she states is not helping. RNCM encouraged pt to call pain MD this morning and make him aware of worsening pain and swelling. Pt verbalized understanding and stated she would call right after we hung up. Pt denies suicidal ideation and stated she has had right much support from friends and family. Pt noted not to be tearful, slurring speech or have flight of thoughts. Pt oriented to person place and time.    Plan: RNCM will check Pt's chart to see if she was able to reach pain MD this day.  RNCM will call pt next week.  RNCM will see pt in 3 weeks.  Rutherford Limerick RN, BSN  [facility] [phone])

## 2014-11-26 NOTE — Patient Outreach (Signed)
RNCM called pt to offer her support and condolences related to pt's mother's funeral today. RNCM was planning to attend but because of unforseen circumstances RNCM will not be able to. Message left for pt on her voice mail to make pt aware that RNCM is thinking of her and is available for support, and pt may call her back if she needs to.    Plan: RNCM will await a return call from pt. RNCM will see pt in 3 weeks.  Rutherford Limerick RN, BSN  [facility] [phone])

## 2014-11-29 DIAGNOSIS — F3189 Other bipolar disorder: Secondary | ICD-10-CM | POA: Diagnosis not present

## 2014-11-30 DIAGNOSIS — F3189 Other bipolar disorder: Secondary | ICD-10-CM | POA: Diagnosis not present

## 2014-12-01 DIAGNOSIS — F3189 Other bipolar disorder: Secondary | ICD-10-CM | POA: Diagnosis not present

## 2014-12-06 ENCOUNTER — Ambulatory Visit: Payer: Commercial Managed Care - HMO | Admitting: Psychiatry

## 2014-12-07 ENCOUNTER — Other Ambulatory Visit: Payer: Self-pay | Admitting: Anesthesiology

## 2014-12-08 DIAGNOSIS — F3189 Other bipolar disorder: Secondary | ICD-10-CM | POA: Diagnosis not present

## 2014-12-09 DIAGNOSIS — F3189 Other bipolar disorder: Secondary | ICD-10-CM | POA: Diagnosis not present

## 2014-12-10 ENCOUNTER — Ambulatory Visit: Payer: Commercial Managed Care - HMO | Attending: Anesthesiology | Admitting: Anesthesiology

## 2014-12-10 ENCOUNTER — Encounter: Payer: Self-pay | Admitting: Anesthesiology

## 2014-12-10 VITALS — BP 105/54 | HR 88 | Temp 98.2°F | Resp 18 | Wt 240.0 lb

## 2014-12-10 DIAGNOSIS — M5441 Lumbago with sciatica, right side: Secondary | ICD-10-CM | POA: Diagnosis not present

## 2014-12-10 DIAGNOSIS — M545 Low back pain, unspecified: Secondary | ICD-10-CM

## 2014-12-10 DIAGNOSIS — M5137 Other intervertebral disc degeneration, lumbosacral region: Secondary | ICD-10-CM

## 2014-12-10 DIAGNOSIS — G8929 Other chronic pain: Secondary | ICD-10-CM | POA: Insufficient documentation

## 2014-12-10 DIAGNOSIS — M5116 Intervertebral disc disorders with radiculopathy, lumbar region: Secondary | ICD-10-CM | POA: Diagnosis not present

## 2014-12-10 DIAGNOSIS — F3189 Other bipolar disorder: Secondary | ICD-10-CM | POA: Diagnosis not present

## 2014-12-10 DIAGNOSIS — M5136 Other intervertebral disc degeneration, lumbar region: Secondary | ICD-10-CM | POA: Diagnosis not present

## 2014-12-10 DIAGNOSIS — M47816 Spondylosis without myelopathy or radiculopathy, lumbar region: Secondary | ICD-10-CM | POA: Diagnosis not present

## 2014-12-10 DIAGNOSIS — M5416 Radiculopathy, lumbar region: Secondary | ICD-10-CM

## 2014-12-10 DIAGNOSIS — M5442 Lumbago with sciatica, left side: Secondary | ICD-10-CM | POA: Diagnosis not present

## 2014-12-10 MED ORDER — TRAMADOL HCL 50 MG PO TABS: 50.0000 mg | ORAL_TABLET | Freq: Three times a day (TID) | ORAL | Status: DC

## 2014-12-10 NOTE — Patient Instructions (Signed)
Epidural Steroid Injection An epidural steroid injection is given to relieve pain in your neck, back, or legs that is caused by the irritation or swelling of a nerve root. This procedure involves injecting a steroid and numbing medicine (anesthetic) into the epidural space. The epidural space is the space between the outer covering of your spinal cord and the bones that form your backbone (vertebra).  LET YOUR HEALTH CARE PROVIDER KNOW ABOUT:   Any allergies you have.  All medicines you are taking, including vitamins, herbs, eye drops, creams, and over-the-counter medicines such as aspirin.  Previous problems you or members of your family have had with the use of anesthetics.  Any blood disorders or blood clotting disorders you have.  Previous surgeries you have had.  Medical conditions you have. RISKS AND COMPLICATIONS Generally, this is a safe procedure. However, as with any procedure, complications can occur. Possible complications of epidural steroid injection include:  Headache.  Bleeding.  Infection.  Allergic reaction to the medicines.  Damage to your nerves. The response to this procedure depends on the underlying cause of the pain and its duration. People who have long-term (chronic) pain are less likely to benefit from epidural steroids than are those people whose pain comes on strong and suddenly. BEFORE THE PROCEDURE   Ask your health care provider about changing or stopping your regular medicines. You may be advised to stop taking blood-thinning medicines a few days before the procedure.  You may be given medicines to reduce anxiety.  Arrange for someone to take you home after the procedure. PROCEDURE   You will remain awake during the procedure. You may receive medicine to make you relaxed.  You will be asked to lie on your stomach.  The injection site will be cleaned.  The injection site will be numbed with a medicine (local anesthetic).  A needle will be  injected through your skin into the epidural space.  Your health care provider will use an X-ray machine to ensure that the steroid is delivered closest to the affected nerve. You may have minimal discomfort at this time.  Once the needle is in the right position, the local anesthetic and the steroid will be injected into the epidural space.  The needle will then be removed and a bandage will be applied to the injection site. AFTER THE PROCEDURE   You may be monitored for a short time before you go home.  You may feel weakness or numbness in your arm or leg, which disappears within hours.  You may be allowed to eat, drink, and take your regular medicine.  You may have soreness at the site of the injection.   This information is not intended to replace advice given to you by your health care provider. Make sure you discuss any questions you have with your health care provider.   Document Released: 05/22/2007 Document Revised: 10/15/2012 Document Reviewed: 08/01/2012 Elsevier Interactive Patient Education 2016 [employer] Pain Management Discharge Instructions  General Discharge Instructions :  If you need to reach your doctor call: Monday-Friday 8:00 am - 4:00 pm at [phone] or toll free [phone].  After clinic hours [phone] to have operator reach doctor.  Bring all of your medication bottles to all your appointments in the pain clinic.  To cancel or reschedule your appointment with Pain Management please remember to call 24 hours in advance to avoid a fee.  Refer to the educational materials which you have been given on: General Risks, I had my Procedure.   Discharge Instructions, Post Sedation.  Post Procedure Instructions:  The drugs you were given will stay in your system until tomorrow, so for the next 24 hours you should not drive, make any legal decisions or drink any alcoholic beverages.  You may eat anything you prefer, but it is better to start with  liquids then soups and crackers, and gradually work up to solid foods.  Please notify your doctor immediately if you have any unusual bleeding, trouble breathing or pain that is not related to your normal pain.  Depending on the type of procedure that was done, some parts of your body may feel week and/or numb.  This usually clears up by tonight or the next day.  Walk with the use of an assistive device or accompanied by an adult for the 24 hours.  You may use ice on the affected area for the first 24 hours.  Put ice in a Ziploc bag and cover with a towel and place against area 15 minutes on 15 minutes off.  You may switch to heat after 24 hours. 

## 2014-12-10 NOTE — Progress Notes (Signed)
Safety precautions to be maintained throughout the outpatient stay will include: orient to surroundings, keep bed in low position, maintain call bell within reach at all times, provide assistance with transfer out of bed and ambulation.  

## 2014-12-10 NOTE — Procedures (Signed)
Date of procedure: 12/10/2014  Preoperative Diagnosis:  1 chronic low back pain  2 lumbar degenerative disc disease 3 lumbar radiculopathy  Postoperative Diagnosis: Same.  Procedure: 1. Caudal epidural steroid injection, 2. Epidural with interpretation. 3. Fluoroscopic guidance.  Surgeon: Lance Bosch, MD  Anesthesia: MAC anesthesia by the nurse and staff under my direction.  Informed consent was obtained and the patient appeared to accept and understand the benefits and risks of this procedure.   Pre procedure comments:  None  Description of the Procedure:  The patient was taken to the operating room and placed in the prone position.  Intravenous sedation and MAC anesthesia was administered by the nurse and staff under my direction. After appropriate sedation, the sacrococcygeal area was prepped with Betadine.   After adequate draping, the area between the sacral cornu was palpated and infiltrated with 3 cc of 1% Lidocaine.   An AP fluoroscopic view of the sacrum was visualized and a 17 gauge Tuohy needle was inserted in the midline at the angle of 45 degrees through the sacrococcygeal membrane.   After making contact with the bone, the needle was withdrawn and readvanced in horizontal position, into the caudal epidural space.  Epidurogram Study: After the contrast media was injected into the caudal epidural space, the dye was observed to spread catheter lab as high as L3 distribution was satisfactory except at the left side at L4 where it appeared that there was some foraminal stenosis which interfered with the distribution of the contrast media.  One cc of Omnipaque 180 was injected through the needle and epidurogram was visualized in both the later and AP views.  Comments:   This procedure was done under fluoroscopic control  Fluoroscopy time was 0.2 minutes  MG Y was 6.6  There were 2 fluoroscopic frames  No catheter was used.  Caudal Epidural Steroid  Injection:  Then 10 cc of 0.25% Bupivacaine and 25 mg of Kenalog were injected into the Caudal epidural space.  The needle was removed and adequate hemostasis was established.    The patient tolerated the procedure quite well and vital signs were stable.   There were no adverse effects.  Additional comments:   None  The patient was taken to the recovery room in satisfactory condition where the patient was observed and subsequently discharged home.  Will follow up in the clinic in the next week.    Lance Bosch M.D.

## 2014-12-10 NOTE — Progress Notes (Signed)
   Subjective:    Patient ID: Nicole Austin, female    DOB: November 11, 1960, 54 y.o.   MRN: [id number]  HPI    Review of Systems     Objective:   Physical Exam        Assessment & Plan:   This patient was out of her tramadol and so I refilled her prescription tramadol 50 mg 3 times a day after meals and I gave her 42 tablets which should last her for 2 weeks  Lance Bosch M.D.

## 2014-12-13 ENCOUNTER — Telehealth: Payer: Self-pay | Admitting: Anesthesiology

## 2014-12-13 DIAGNOSIS — F3189 Other bipolar disorder: Secondary | ICD-10-CM | POA: Diagnosis not present

## 2014-12-13 NOTE — Telephone Encounter (Signed)
Spoke with patient, advised that it is normal to still have pain 3 days post-procedure. Advised to use heat to the lower back area, may take several more days to see any change in pain.

## 2014-12-13 NOTE — Telephone Encounter (Signed)
Attempted to call patient, message left. 

## 2014-12-13 NOTE — Telephone Encounter (Signed)
Had procedure Fri 12-10-14  Still having a lot of pain / please call asap

## 2014-12-14 ENCOUNTER — Other Ambulatory Visit: Payer: Self-pay | Admitting: *Deleted

## 2014-12-14 DIAGNOSIS — F3189 Other bipolar disorder: Secondary | ICD-10-CM | POA: Diagnosis not present

## 2014-12-14 NOTE — Patient Outreach (Signed)
[facility]) Care Management   12/14/2014  Nicole Austin 1961/02/26 [id number]  Nicole Austin is an 54 y.o. female  Subjective: " I am doing better"  " I am making the best of things and I have a lot of support right now."   Objective: Blood pressure 132/80, pulse 95, resp. rate 18, height 1.575 m (5\' 2" ), weight 245 lb (111.131 kg), SpO2 97 %.  Review of Systems  Constitutional: Positive for malaise/fatigue.  Cardiovascular: Positive for leg swelling.  Genitourinary:       Pt reports she has an appt. w gyn Md related to new vag bleeding.  Musculoskeletal: Positive for back pain.    Physical Exam  Constitutional: She is oriented to person, place, and time. She appears well-developed and well-nourished.  Cardiovascular: Normal rate and regular rhythm.   Pulses:      Dorsalis pedis pulses are 2+ on the right side, and 2+ on the left side.  Respiratory: Effort normal and breath sounds normal.  GI: Soft. Bowel sounds are normal.  Musculoskeletal: Normal range of motion. She exhibits edema.       Lumbar back: She exhibits pain.       Right lower leg: She exhibits edema.       Left lower leg: She exhibits edema.  Neurological: She is alert and oriented to person, place, and time.  Skin: Skin is warm and dry.  Psychiatric: She has a normal mood and affect. Her speech is normal and behavior is normal. Judgment and thought content normal. Cognition and memory are normal. She expresses no suicidal plans.    Current Medications:   Current Outpatient Prescriptions  Medication Sig Dispense Refill  . albuterol (PROVENTIL HFA;VENTOLIN HFA) 108 (90 BASE) MCG/ACT inhaler Inhale 1-2 puffs into the lungs every 6 (six) hours as needed for wheezing or shortness of breath. 6.7 g 2  . ALPRAZolam (XANAX) 1 MG tablet Take 1 tablet (1 mg total) by mouth 3 (three) times daily as needed for anxiety. 90 tablet 1  . cyclobenzaprine (FLEXERIL) 5 MG tablet Take 2 tablets (10 mg  total) by mouth 3 (three) times daily as needed for muscle spasms. 90 tablet 5  . divalproex (DEPAKOTE) 250 MG DR tablet Take 1 tablet (250 mg total) by mouth 2 (two) times daily. 60 tablet 1  . escitalopram (LEXAPRO) 20 MG tablet Take 1 tablet (20 mg total) by mouth daily. 30 tablet 2  . lidocaine (XYLOCAINE) 5 % ointment Apply 1 application topically as needed for mild pain.    Marland Kitchen LYRICA 200 MG capsule Take 1 capsule (200 mg total) by mouth 2 (two) times daily. 60 capsule 5  . Misc Natural Products (OSTEO BI-FLEX ADV TRIPLE ST) TABS Take 1 tablet by mouth 2 (two) times daily.    . Multiple Vitamin (MULTIVITAMIN WITH MINERALS) TABS tablet Take 1 tablet by mouth daily.    . QUEtiapine (SEROQUEL XR) 300 MG 24 hr tablet Take 1 tablet (300 mg total) by mouth every evening. 30 tablet 1  . traMADol (ULTRAM) 50 MG tablet Take 1 tablet (50 mg total) by mouth 3 (three) times daily after meals. 42 tablet 0  . hydrOXYzine (VISTARIL) 25 MG capsule 1-2 tablets twice daily as needed for anxiety. (Patient not taking: Reported on 12/10/2014) 120 capsule 0  . predniSONE (STERAPRED UNI-PAK 21 TAB) 10 MG (21) TBPK tablet Use as directed in a 6 day taper predpak (Patient not taking: Reported on 11/24/2014) 21 tablet 0  No current facility-administered medications for this visit.    Functional Status:   In your present state of health, do you have any difficulty performing the following activities: 11/16/2014 08/05/2014  Hearing? N N  Vision? N Y  Difficulty concentrating or making decisions? N Y  Walking or climbing stairs? N Y  Dressing or bathing? N Y  Doing errands, shopping? N N  Preparing Food and eating ? - N  Using the Toilet? - N  In the past six months, have you accidently leaked urine? - Y  Do you have problems with loss of bowel control? - N  Managing your Medications? - N  Managing your Finances? - N  Housekeeping or managing your Housekeeping? - Y    Fall/Depression Screening:    PHQ 2/9  Scores 12/10/2014 11/24/2014 11/16/2014 10/15/2014 09/10/2014 08/25/2014 08/05/2014  PHQ - 2 Score 6 3 1 1  0 1 0  PHQ- 9 Score 24 16 10 9  - - -  Exception Documentation (No Data) - - - (No Data) - -    Assessment: RNCM arrived pt was still in the bed. Pt's roommate Santiago Glad answered the door. Pt rested in bed for most of the visit stating she was tired from her back pain. RNCM talked with pt about recent loss of her mother, pt reports she is doing better. Pt reported she and roommate are planning to move from current apartment to a nicer one and she was happy about this. RNCM asked pt if she had gotten a referral for Dr. Rockey Situ Cardiologist and she said she had. Appointment w [facility] Nov 4, RNCM wrote time and date on pt's calendar. RNCM stressed to pt the importance of this follow up. Pt reports she has transportation. Med Review done and RNCM counseled pt on med adherence. RNCM talked with pt about getting back on track with a healthy diet and exercise, pt reports she was trying to. She stated she still had education provided by [facility] on these subjects. RNCM talked with pt about smoking cessation but pt not receptive at this time. Pt stating she still has personal care services coming to assist her daily. RNCM made pt aware she would be discharged from care management at this time but would be able to reenter the program if needed. Pt has [facility] main office numbers. RNCM made pt aware [facility] was still open to her case if she had further needs.   Plan: RNCM will close this pt to community care management.  Rutherford Limerick RN, BSN  [facility] Care Management [phone])

## 2014-12-15 DIAGNOSIS — F3189 Other bipolar disorder: Secondary | ICD-10-CM | POA: Diagnosis not present

## 2014-12-16 DIAGNOSIS — F3189 Other bipolar disorder: Secondary | ICD-10-CM | POA: Diagnosis not present

## 2014-12-17 ENCOUNTER — Encounter: Payer: Self-pay | Admitting: *Deleted

## 2014-12-17 ENCOUNTER — Other Ambulatory Visit: Payer: Self-pay | Admitting: *Deleted

## 2014-12-17 DIAGNOSIS — F3189 Other bipolar disorder: Secondary | ICD-10-CM | POA: Diagnosis not present

## 2014-12-17 NOTE — Patient Outreach (Signed)
[facility]) Care Management  12/17/2014  Nicole Austin 1960-05-22 [id number]   Phone call to patient to confirm involvement with the [facility] program.  Initial call made to [facility] who confirms that patient is active in the program and [facility]  is scheduled for today, 12/17/14.  Patient active with outpatient psychiatry.    Patient confirms this, stating that the phone calls are helpful and working out fine.     Patient to be closed to social work services at this time. Patient informed to call the [facility] main office number if further needs arise in the future.    [name] [facility] Care Management [phone]

## 2014-12-20 DIAGNOSIS — F3189 Other bipolar disorder: Secondary | ICD-10-CM | POA: Diagnosis not present

## 2014-12-20 NOTE — Patient Outreach (Signed)
[facility]) Care Management  12/20/2014  Nicole Austin [birth year] [id number]   Notification from [facility], LCSW to close case due to goals met with [place] Management.  Thanks, Ronnell Freshwater. [place], Shelley Assistant Phone: [phone] Fax: [phone]

## 2014-12-21 DIAGNOSIS — F3189 Other bipolar disorder: Secondary | ICD-10-CM | POA: Diagnosis not present

## 2014-12-22 ENCOUNTER — Encounter: Payer: Self-pay | Admitting: Family Medicine

## 2014-12-22 ENCOUNTER — Ambulatory Visit (INDEPENDENT_AMBULATORY_CARE_PROVIDER_SITE_OTHER): Payer: Commercial Managed Care - HMO | Admitting: Family Medicine

## 2014-12-22 VITALS — BP 116/70 | HR 115 | Temp 98.8°F | Resp 16 | Wt 241.9 lb

## 2014-12-22 DIAGNOSIS — N939 Abnormal uterine and vaginal bleeding, unspecified: Secondary | ICD-10-CM | POA: Diagnosis not present

## 2014-12-22 DIAGNOSIS — J449 Chronic obstructive pulmonary disease, unspecified: Secondary | ICD-10-CM

## 2014-12-22 DIAGNOSIS — Z13820 Encounter for screening for osteoporosis: Secondary | ICD-10-CM | POA: Diagnosis not present

## 2014-12-22 DIAGNOSIS — Z1211 Encounter for screening for malignant neoplasm of colon: Secondary | ICD-10-CM | POA: Insufficient documentation

## 2014-12-22 DIAGNOSIS — Z23 Encounter for immunization: Secondary | ICD-10-CM

## 2014-12-22 DIAGNOSIS — F3130 Bipolar disorder, current episode depressed, mild or moderate severity, unspecified: Secondary | ICD-10-CM | POA: Insufficient documentation

## 2014-12-22 DIAGNOSIS — F319 Bipolar disorder, unspecified: Secondary | ICD-10-CM | POA: Insufficient documentation

## 2014-12-22 DIAGNOSIS — Z1231 Encounter for screening mammogram for malignant neoplasm of breast: Secondary | ICD-10-CM | POA: Diagnosis not present

## 2014-12-22 DIAGNOSIS — Z0000 Encounter for general adult medical examination without abnormal findings: Secondary | ICD-10-CM | POA: Diagnosis not present

## 2014-12-22 DIAGNOSIS — Z124 Encounter for screening for malignant neoplasm of cervix: Secondary | ICD-10-CM

## 2014-12-22 NOTE — Progress Notes (Signed)
Name: Nicole Austin   MRN: [id number]    DOB: [birth year]   Date:12/22/2014       Progress Note  Subjective  Chief Complaint  Chief Complaint  Patient presents with  . Referral    insurance is requiring that this patient has a mammogram, dexa scan, spirometry performed  . Spasms    right gluteal to lower calf    HPI  Patient is here today for a Complete Female Physical Exam:  The patient has a complex medial history and complex mental health history. Today she reports she is hanging on and feels stable.  Diet is not well balanced. In general does not exercise regularly. Sees dentist regularly and addresses vision concerns with ophthalmologist if applicable. In regards to sexual activity the patient is not currently sexually active. Currently is not concerned about exposure to any STDs.   Menstrual history is post menopausal but she mentions that 2 weeks ago she had some questionable vaginal spotting not associated with pelvic pain.  COPD Follow-up: He has previously been evaluated here for COPD exacerbation and presents for an follow-up with no reported acute exacerbation symptoms today. Symptoms currently include dyspnea with exertion and occasional cough with scant white or yellow sputum. Observed precipitants include cigarette smoke, URI, dramatic change in weather temperature.  Current limitations in activity from COPD is prolonged walking or climbing stairs. Has not needed any maintenance or rescue inhalers in several months. She has been on Advair before but felt that it made her too anxious so she self discontinued.    Past Medical History  Diagnosis Date  . COPD (chronic obstructive pulmonary disease) ([place])   . PTSD (post-traumatic stress disorder)   . Rhabdomyolysis   . Bipolar 1 disorder ([place])   . Arthritis   . Stroke ([place])   . MI (myocardial infarction) ([place])   . Depression   . ADHD (attention deficit hyperactivity disorder)   . Asthma     Past Surgical History   Procedure Laterality Date  . Replacement total knee Left     Family History  Problem Relation Age of Onset  . Hernia Mother   . Heart disease Mother   . OCD Mother   . Diabetes Mother   . Parkinson's disease Father   . Bipolar disorder Sister   . Schizophrenia Sister   . ADD / ADHD Sister   . Alcohol abuse Brother   . Bipolar disorder Sister   . Paranoid behavior Sister   . ADD / ADHD Sister     Social History   Social History  . Marital Status: Divorced    Spouse Name: N/A  . Number of Children: N/A  . Years of Education: N/A   Occupational History  . Not on file.   Social History Main Topics  . Smoking status: Current Every Day Smoker -- 0.50 packs/day    Types: Cigarettes    Start date: 10/11/1984  . Smokeless tobacco: Never Used  . Alcohol Use: No  . Drug Use: No  . Sexual Activity: No   Other Topics Concern  . Not on file   Social History Narrative     Current outpatient prescriptions:  .  albuterol (PROVENTIL HFA;VENTOLIN HFA) 108 (90 BASE) MCG/ACT inhaler, Inhale 1-2 puffs into the lungs every 6 (six) hours as needed for wheezing or shortness of breath., Disp: 6.7 g, Rfl: 2 .  ALPRAZolam (XANAX) 1 MG tablet, Take 1 tablet (1 mg total) by mouth 3 (three) times daily as needed  for anxiety., Disp: 90 tablet, Rfl: 1 .  cyclobenzaprine (FLEXERIL) 5 MG tablet, Take 2 tablets (10 mg total) by mouth 3 (three) times daily as needed for muscle spasms., Disp: 90 tablet, Rfl: 5 .  divalproex (DEPAKOTE) 250 MG DR tablet, Take 1 tablet (250 mg total) by mouth 2 (two) times daily., Disp: 60 tablet, Rfl: 1 .  escitalopram (LEXAPRO) 20 MG tablet, Take 1 tablet (20 mg total) by mouth daily., Disp: 30 tablet, Rfl: 2 .  lidocaine (XYLOCAINE) 5 % ointment, Apply 1 application topically as needed for mild pain., Disp: , Rfl:  .  LYRICA 200 MG capsule, Take 1 capsule (200 mg total) by mouth 2 (two) times daily., Disp: 60 capsule, Rfl: 5 .  Misc Natural Products (OSTEO BI-FLEX  ADV TRIPLE ST) TABS, Take 1 tablet by mouth 2 (two) times daily., Disp: , Rfl:  .  Multiple Vitamin (MULTIVITAMIN WITH MINERALS) TABS tablet, Take 1 tablet by mouth daily., Disp: , Rfl:  .  QUEtiapine (SEROQUEL XR) 300 MG 24 hr tablet, Take 1 tablet (300 mg total) by mouth every evening., Disp: 30 tablet, Rfl: 1 .  traMADol (ULTRAM) 50 MG tablet, Take 1 tablet (50 mg total) by mouth 3 (three) times daily after meals., Disp: 42 tablet, Rfl: 0  Allergies  Allergen Reactions  . Codeine Nausea And Vomiting    ROS  CONSTITUTIONAL: No significant weight changes, fever, chills, weakness or fatigue.  HEENT:  - Eyes: No visual changes.  - Ears: No auditory changes. No pain.  - Nose: No sneezing, congestion, runny nose. - Throat: No sore throat. No changes in swallowing. SKIN: No rash or itching.  CARDIOVASCULAR: No chest pain, chest pressure or chest discomfort. No palpitations or edema.  RESPIRATORY: No shortness of breath, cough or sputum.  GASTROINTESTINAL: No anorexia, nausea, vomiting. No changes in bowel habits. No abdominal pain or blood.  GENITOURINARY: No dysuria. No frequency. No discharge.  NEUROLOGICAL: No headache, dizziness, syncope, paralysis, ataxia, numbness or tingling in the extremities. No memory changes. No change in bowel or bladder control.  MUSCULOSKELETAL: No joint pain. No muscle pain. HEMATOLOGIC: No anemia, bleeding or bruising.  LYMPHATICS: No enlarged lymph nodes.  PSYCHIATRIC: No change in mood. No change in sleep pattern.  ENDOCRINOLOGIC: No reports of sweating, cold or heat intolerance. No polyuria or polydipsia.   Objective  Filed Vitals:   12/22/14 1421  BP: 116/70  Pulse: 115  Temp: 98.8 F (37.1 C)  Resp: 16  Weight: 241 lb 14.4 oz (109.725 kg)  SpO2: 96%   Body mass index is 44.23 kg/(m^2).  Depression screen [facility] 2/9 12/10/2014 11/24/2014 11/16/2014 10/15/2014 09/10/2014  Decreased Interest 3 0 1 1 0  Down, Depressed, Hopeless 3 3 - 0 0  PHQ - 2  Score 6 3 1 1  0  Altered sleeping 3 2 2 2  -  Tired, decreased energy 3 2 2 2  -  Change in appetite 3 2 2 2  -  Feeling bad or failure about yourself  3 2 1 1  -  Trouble concentrating 3 3 0 1 -  Moving slowly or fidgety/restless 3 2 2  0 -  Suicidal thoughts 0 0 0 0 -  PHQ-9 Score 24 16 10 9  -  Difficult doing work/chores Extremely dIfficult Extremely dIfficult Very difficult Very difficult -      Recent Results (from the past 2160 hour(s))  Urine Drug Screen, Qualitative ([place] only)     Status: Abnormal   Collection Time: 11/05/14  4:20  PM  Result Value Ref Range   Tricyclic, Ur Screen POSITIVE (A) NONE DETECTED   Amphetamines, Ur Screen NONE DETECTED NONE DETECTED   MDMA (Ecstasy)Ur Screen NONE DETECTED NONE DETECTED   Cocaine Metabolite,Ur [place] NONE DETECTED NONE DETECTED   Opiate, Ur Screen NONE DETECTED NONE DETECTED   Phencyclidine (PCP) Ur S NONE DETECTED NONE DETECTED   Cannabinoid 50 Ng, Ur [place] NONE DETECTED NONE DETECTED   Barbiturates, Ur Screen NONE DETECTED NONE DETECTED   Benzodiazepine, Ur Scrn POSITIVE (A) NONE DETECTED   Methadone Scn, Ur NONE DETECTED NONE DETECTED    Comment: (NOTE) 563  Tricyclics, urine               Cutoff 1000 ng/mL 200  Amphetamines, urine             Cutoff 1000 ng/mL 300  MDMA (Ecstasy), urine           Cutoff 500 ng/mL 400  Cocaine Metabolite, urine       Cutoff 300 ng/mL 500  Opiate, urine                   Cutoff 300 ng/mL 600  Phencyclidine (PCP), urine      Cutoff 25 ng/mL 700  Cannabinoid, urine              Cutoff 50 ng/mL 800  Barbiturates, urine             Cutoff 200 ng/mL 900  Benzodiazepine, urine           Cutoff 200 ng/mL 1000 Methadone, urine                Cutoff 300 ng/mL 1100 1200 The urine drug screen provides only a preliminary, unconfirmed 1300 analytical test result and should not be used for non-medical 1400 purposes. Clinical consideration and professional judgment should 1500 be applied to any positive drug  screen result due to possible 1600 interfering substances. A more specific alternate chemical method 1700 must be used in order to obtain a confirmed analytical result.  1800 Gas chromato graphy / mass spectrometry (GC/MS) is the preferred 1900 confirmatory method.   CBC with Differential     Status: Abnormal   Collection Time: 11/05/14  4:28 PM  Result Value Ref Range   WBC 10.5 3.6 - 11.0 K/uL   RBC 4.45 3.80 - 5.20 MIL/uL   Hemoglobin 13.2 12.0 - 16.0 g/dL   HCT 40.0 35.0 - 47.0 %   MCV 89.9 80.0 - 100.0 fL   MCH 29.6 26.0 - 34.0 pg   MCHC 32.9 32.0 - 36.0 g/dL   RDW 16.1 (H) 11.5 - 14.5 %   Platelets 218 150 - 440 K/uL   Neutrophils Relative % 61 %   Neutro Abs 6.5 1.4 - 6.5 K/uL   Lymphocytes Relative 29 %   Lymphs Abs 3.0 1.0 - 3.6 K/uL   Monocytes Relative 8 %   Monocytes Absolute 0.8 0.2 - 0.9 K/uL   Eosinophils Relative 1 %   Eosinophils Absolute 0.1 0 - 0.7 K/uL   Basophils Relative 1 %   Basophils Absolute 0.1 0 - 0.1 K/uL  Comprehensive metabolic panel     Status: Abnormal   Collection Time: 11/05/14  4:28 PM  Result Value Ref Range   Sodium 137 135 - 145 mmol/L   Potassium 3.7 3.5 - 5.1 mmol/L   Chloride 100 (L) 101 - 111 mmol/L   CO2 28 22 - 32 mmol/L  Glucose, Bld 87 65 - 99 mg/dL   BUN 10 6 - 20 mg/dL   Creatinine, Ser 0.90 0.44 - 1.00 mg/dL   Calcium 8.8 (L) 8.9 - 10.3 mg/dL   Total Protein 6.6 6.5 - 8.1 g/dL   Albumin 3.7 3.5 - 5.0 g/dL   AST 24 15 - 41 U/L   ALT 26 14 - 54 U/L   Alkaline Phosphatase 89 38 - 126 U/L   Total Bilirubin 0.3 0.3 - 1.2 mg/dL   GFR calc non Af Amer >60 >60 mL/min   GFR calc Af Amer >60 >60 mL/min    Comment: (NOTE) The eGFR has been calculated using the CKD EPI equation. This calculation has not been validated in all clinical situations. eGFR's persistently <60 mL/min signify possible Chronic Kidney Disease.    Anion gap 9 5 - 15  Troponin I     Status: None   Collection Time: 11/05/14  4:28 PM  Result Value Ref  Range   Troponin I <0.03 <0.031 ng/mL    Comment:        NO INDICATION OF MYOCARDIAL INJURY.   Fibrin derivatives D-Dimer ([facility] only)     Status: Abnormal   Collection Time: 11/05/14  4:28 PM  Result Value Ref Range   Fibrin derivatives D-dimer ([facility]) 1023.86 (H) 0 - 499    Comment: <> Exclusion of Venous Thromboembolism (VTE) - OUTPATIENTS ONLY        (Emergency Department or [place])             0-499 ng/ml (FEU)  : With a low to intermediate pretest                                        probability for VTE this test result                                        excludes the diagnosis of VTE.           > 499 ng/ml (FEU)  : VTE not excluded.  Additional work up                                   for VTE is required.   <>  Testing on Inpatients and Evaluation of Disseminated Intravascular        Coagulation ([facility])             Reference Range:   0-499 ng/ml (FEU)   Acetaminophen level     Status: Abnormal   Collection Time: 11/05/14  4:28 PM  Result Value Ref Range   Acetaminophen (Tylenol), Serum <10 (L) 10 - 30 ug/mL    Comment:        THERAPEUTIC CONCENTRATIONS VARY SIGNIFICANTLY. A RANGE OF 10-30 ug/mL MAY BE AN EFFECTIVE CONCENTRATION FOR MANY PATIENTS. HOWEVER, SOME ARE BEST TREATED AT CONCENTRATIONS OUTSIDE THIS RANGE. ACETAMINOPHEN CONCENTRATIONS >150 ug/mL AT 4 HOURS AFTER INGESTION AND >50 ug/mL AT 12 HOURS AFTER INGESTION ARE OFTEN ASSOCIATED WITH TOXIC REACTIONS.   Salicylate level     Status: None   Collection Time: 11/05/14  4:28 PM  Result Value Ref Range   Salicylate Lvl <0.1 2.8 - 30.0 mg/dL  Ethanol     Status:  None   Collection Time: 11/05/14  4:28 PM  Result Value Ref Range   Alcohol, Ethyl (B) <5 <5 mg/dL    Comment:        LOWEST DETECTABLE LIMIT FOR SERUM ALCOHOL IS 5 mg/dL FOR MEDICAL PURPOSES ONLY   Troponin I     Status: None   Collection Time: 11/05/14  7:35 PM  Result Value Ref Range   Troponin I <0.03 <0.031 ng/mL    Comment:         NO INDICATION OF MYOCARDIAL INJURY.     Physical Exam  Constitutional: Patient is obese and well-nourished. In no distress.  HEENT:  - Head: Normocephalic and atraumatic.  - Ears: Bilateral TMs gray, no erythema or effusion - Nose: Nasal mucosa moist - Mouth/Throat: Oropharynx is clear and moist. No tonsillar hypertrophy or erythema. No post nasal drainage.  - Eyes: Conjunctivae clear, EOM movements normal. PERRLA. No scleral icterus.  Neck: Normal range of motion. Neck supple. No JVD present. No thyromegaly present.  Cardiovascular: Normal rate, regular rhythm and normal heart sounds.  No murmur heard.  Pulmonary/Chest: Effort normal and breath sounds normal. No respiratory distress. Abdominal: Soft. Bowel sounds are normal, no distension. There is no tenderness. no masses BREAST: Bilateral breast exam normal with no masses, skin changes or nipple discharge FEMALE GENITALIA:  External genitalia normal External urethra normal Vaginal vault normal without discharge or lesions Cervix normal without discharge or lesions, anteverted uterus making cervix point down Bimanual exam normal without masses RECTAL: no rectal masses or hemorrhoids Musculoskeletal: Normal range of motion bilateral UE and LE, no joint effusions. Peripheral vascular: Bilateral LE no edema. Neurological: CN II-XII grossly intact with no focal deficits. Alert and oriented to person, place, and time. Coordination, balance, strength, speech and gait are normal.  Skin: Skin is warm and dry. No rash noted. No erythema.  Psychiatric: Patient has a stable mood and affect. Behavior is normal in office today. Judgment and thought content normal in office today.   Assessment & Plan  1. Annual physical exam Preventative measures implemented today.  2. Need for influenza vaccination Done  3. Moderate COPD (chronic obstructive pulmonary disease) (HCC) Spirometry testing poor technique today, not reliable. Based on  clinical exam we have decided against daily control inhaler at this time. Needs to stop smoking.  4. Encounter for screening for malignant neoplasm of colon  - Ambulatory referral to Gen Surg, [facility] for C-scope  5. Encounter for screening for malignant neoplasm of cervix Difficult PAP testing due to anterior tilt of uterus causing cervix to point down and back towards her dorsal surface.   - Pap IG w/ reflex to HPV when ASC-U  6. Encounter for screening mammogram for malignant neoplasm of breast  - MM Digital Screening; Future  7. Screening for osteoporosis  - DG Bone Density; Future  8. Vaginal bleeding Resolved. No bleeding seen on pelvic exam. PAP testing done today. If re occurs instructed patient to follow up as next step is pelvic US.

## 2014-12-23 ENCOUNTER — Encounter: Payer: Self-pay | Admitting: Family Medicine

## 2014-12-23 DIAGNOSIS — Z124 Encounter for screening for malignant neoplasm of cervix: Secondary | ICD-10-CM | POA: Diagnosis not present

## 2014-12-24 ENCOUNTER — Other Ambulatory Visit: Payer: Self-pay

## 2014-12-24 ENCOUNTER — Telehealth: Payer: Self-pay | Admitting: Gastroenterology

## 2014-12-24 ENCOUNTER — Telehealth: Payer: Self-pay

## 2014-12-24 NOTE — Telephone Encounter (Signed)
Gastroenterology Pre-Procedure Review  Request Date: 01-11-2015 Requesting Physician: Dr.   PATIENT REVIEW QUESTIONS: The patient responded to the following health history questions as indicated:    1. Are you having any GI issues? no 2. Do you have a personal history of Polyps? no 3. Do you have a family history of Colon Cancer or Polyps? no 4. Diabetes Mellitus? NO 5. Joint replacements in the past 12 months?yes (Total knee replacement last year) 6. Major health problems in the past 3 months?no 7. Any artificial heart valves, MVP, or defibrillator?no    MEDICATIONS & ALLERGIES:    Patient reports the following regarding taking any anticoagulation/antiplatelet therapy:   Plavix, Coumadin, Eliquis, Xarelto, Lovenox, Pradaxa, Brilinta, or Effient? no Aspirin? no  Patient confirms/reports the following medications:  Current Outpatient Prescriptions  Medication Sig Dispense Refill   albuterol (PROVENTIL HFA;VENTOLIN HFA) 108 (90 BASE) MCG/ACT inhaler Inhale 1-2 puffs into the lungs every 6 (six) hours as needed for wheezing or shortness of breath. 6.7 g 2   ALPRAZolam (XANAX) 1 MG tablet Take 1 tablet (1 mg total) by mouth 3 (three) times daily as needed for anxiety. 90 tablet 1   cyclobenzaprine (FLEXERIL) 5 MG tablet Take 2 tablets (10 mg total) by mouth 3 (three) times daily as needed for muscle spasms. 90 tablet 5   divalproex (DEPAKOTE) 250 MG DR tablet Take 1 tablet (250 mg total) by mouth 2 (two) times daily. 60 tablet 1   escitalopram (LEXAPRO) 20 MG tablet Take 1 tablet (20 mg total) by mouth daily. 30 tablet 2   lidocaine (XYLOCAINE) 5 % ointment Apply 1 application topically as needed for mild pain.     LYRICA 200 MG capsule Take 1 capsule (200 mg total) by mouth 2 (two) times daily. 60 capsule 5   Misc Natural Products (OSTEO BI-FLEX ADV TRIPLE ST) TABS Take 1 tablet by mouth 2 (two) times daily.     Multiple Vitamin (MULTIVITAMIN WITH MINERALS) TABS tablet Take 1  tablet by mouth daily.     QUEtiapine (SEROQUEL XR) 300 MG 24 hr tablet Take 1 tablet (300 mg total) by mouth every evening. 30 tablet 1   traMADol (ULTRAM) 50 MG tablet Take 1 tablet (50 mg total) by mouth 3 (three) times daily after meals. 42 tablet 0   No current facility-administered medications for this visit.    Patient confirms/reports the following allergies:  Allergies  Allergen Reactions   Codeine Nausea And Vomiting    No orders of the defined types were placed in this encounter.    AUTHORIZATION INFORMATION Primary Insurance: 1D#: Group #:  Secondary Insurance: 1D#: Group #:  SCHEDULE INFORMATION: Date: 01-11-2015 Time: Location:[facility]

## 2014-12-27 LAB — PAP IG W/ RFLX HPV ASCU: PAP SMEAR COMMENT: 0

## 2014-12-28 NOTE — Telephone Encounter (Signed)
Opened in error

## 2014-12-29 ENCOUNTER — Ambulatory Visit: Payer: Commercial Managed Care - HMO | Admitting: Anesthesiology

## 2014-12-30 DIAGNOSIS — F3189 Other bipolar disorder: Secondary | ICD-10-CM | POA: Diagnosis not present

## 2014-12-31 ENCOUNTER — Encounter: Payer: Self-pay | Admitting: Cardiovascular Disease

## 2014-12-31 ENCOUNTER — Ambulatory Visit (INDEPENDENT_AMBULATORY_CARE_PROVIDER_SITE_OTHER): Payer: Commercial Managed Care - HMO | Admitting: Cardiovascular Disease

## 2014-12-31 VITALS — BP 107/65 | HR 95 | Ht 61.0 in | Wt 253.5 lb

## 2014-12-31 DIAGNOSIS — R079 Chest pain, unspecified: Secondary | ICD-10-CM | POA: Diagnosis not present

## 2014-12-31 DIAGNOSIS — J449 Chronic obstructive pulmonary disease, unspecified: Secondary | ICD-10-CM

## 2014-12-31 DIAGNOSIS — F17200 Nicotine dependence, unspecified, uncomplicated: Secondary | ICD-10-CM

## 2014-12-31 DIAGNOSIS — R0602 Shortness of breath: Secondary | ICD-10-CM | POA: Diagnosis not present

## 2014-12-31 DIAGNOSIS — F3189 Other bipolar disorder: Secondary | ICD-10-CM | POA: Diagnosis not present

## 2014-12-31 DIAGNOSIS — F319 Bipolar disorder, unspecified: Secondary | ICD-10-CM

## 2014-12-31 NOTE — Assessment & Plan Note (Signed)
We have encouraged continued exercise, careful diet management in an effort to lose weight. 

## 2014-12-31 NOTE — Assessment & Plan Note (Signed)
Atypical chest pain, no further workup needed CT scan of her chest reviewed with her showing no significant coronary artery atherosclerosis, no significant aortic plaque Other risk factors are good except for smoking Nondiabetic, cholesterol excellent

## 2014-12-31 NOTE — Assessment & Plan Note (Signed)
Seen by psychiatry Appears stable on today's visit Adjustment disorder after the death of her mother several months ago

## 2014-12-31 NOTE — Progress Notes (Signed)
Patient ID: Nicole Austin, female    DOB: 06/26/1960, 54 y.o.   MRN: [id number]  HPI Comments: [name] is a 54 year old woman with morbid obesity, COPD, who continues to smoke more than one pack per day, bipolar, PTSD per the notes, who presents for evaluation of chest pain  She reports that she was in the hospital earlier this year for heart attack Review of the records from May 2016 shows she had encephalopathy, possibly secondary to pain medication She had left-sided weakness that improved with further monitoring MRI did not show stroke  She presents in a wheelchair, not on oxygen in no distress There has been significant stress at home with her housing She would like to move in with her girlfriends  Reports having chest pain sometimes at rest, does not last very long Several trips to the emergency room for various issues  Has been otherwise very active, shopping, spending time with her grandchildren On her feet for long periods of time, causing some leg swelling Swelling is been a chronic issue, sometimes wears compression hose  EKG on today's visit shows normal sinus rhythm with rate 95 bpm, no significant ST or T-wave changes CT angiogram of the chest reviewed with her in detail This shows no significant coronary artery atherosclerosis, minimal aortic plaquing at the level of the ostial carotids, otherwise no significant atherosclerotic plaque through her aorta     Allergies  Allergen Reactions  . Codeine Nausea And Vomiting    Current Outpatient Prescriptions on File Prior to Visit  Medication Sig Dispense Refill  . albuterol (PROVENTIL HFA;VENTOLIN HFA) 108 (90 BASE) MCG/ACT inhaler Inhale 1-2 puffs into the lungs every 6 (six) hours as needed for wheezing or shortness of breath. 6.7 g 2  . cyclobenzaprine (FLEXERIL) 5 MG tablet Take 2 tablets (10 mg total) by mouth 3 (three) times daily as needed for muscle spasms. 90 tablet 5  . divalproex (DEPAKOTE) 250 MG DR  tablet Take 1 tablet (250 mg total) by mouth 2 (two) times daily. 60 tablet 1  . escitalopram (LEXAPRO) 20 MG tablet Take 1 tablet (20 mg total) by mouth daily. 30 tablet 2  . lidocaine (XYLOCAINE) 5 % ointment Apply 1 application topically as needed for mild pain.    Marland Kitchen LYRICA 200 MG capsule Take 1 capsule (200 mg total) by mouth 2 (two) times daily. 60 capsule 5  . Misc Natural Products (OSTEO BI-FLEX ADV TRIPLE ST) TABS Take 1 tablet by mouth 2 (two) times daily.    . Multiple Vitamin (MULTIVITAMIN WITH MINERALS) TABS tablet Take 1 tablet by mouth daily.    . QUEtiapine (SEROQUEL XR) 300 MG 24 hr tablet Take 1 tablet (300 mg total) by mouth every evening. 30 tablet 1  . traMADol (ULTRAM) 50 MG tablet Take 1 tablet (50 mg total) by mouth 3 (three) times daily after meals. 42 tablet 0   No current facility-administered medications on file prior to visit.    Past Medical History  Diagnosis Date  . COPD (chronic obstructive pulmonary disease) ([place])   . PTSD (post-traumatic stress disorder)   . Rhabdomyolysis   . Bipolar 1 disorder ([place])   . Arthritis   . Stroke ([place])   . MI (myocardial infarction) ([place])   . Depression   . ADHD (attention deficit hyperactivity disorder)   . Asthma     Past Surgical History  Procedure Laterality Date  . Replacement total knee Left     Social History  reports that  she has been smoking Cigarettes.  She started smoking about 30 years ago. She has been smoking about 0.50 packs per day. She has never used smokeless tobacco. She reports that she does not drink alcohol or use illicit drugs.  Family History family history includes ADD / ADHD in her sister and sister; Alcohol abuse in her brother; Bipolar disorder in her sister and sister; Diabetes in her mother; Heart disease in her mother; Hernia in her mother; OCD in her mother; Paranoid behavior in her sister; Parkinson's disease in her father; Schizophrenia in her sister.    Review of Systems   Constitutional: Positive for fatigue.  Respiratory: Positive for chest tightness and shortness of breath.   Gastrointestinal: Negative.   Musculoskeletal: Positive for gait problem.  Allergic/Immunologic: Negative.   Neurological: Negative.   Hematological: Negative.   Psychiatric/Behavioral: Negative.   All other systems reviewed and are negative.   BP 107/65 mmHg  Pulse 95  Ht 5\' 1"  (1.549 m)  Wt 253 lb 8 oz (114.987 kg)  BMI 47.92 kg/m2  SpO2 94%  LMP  (LMP Unknown)   Physical Exam  Constitutional: She is oriented to person, place, and time. She appears well-developed and well-nourished.  Obesity, presenting in a wheelchair  HENT:  Head: Normocephalic.  Nose: Nose normal.  Mouth/Throat: Oropharynx is clear and moist.  Eyes: Conjunctivae are normal. Pupils are equal, round, and reactive to light.  Neck: Normal range of motion. Neck supple. No JVD present.  Cardiovascular: Normal rate, regular rhythm, normal heart sounds and intact distal pulses.  Exam reveals no gallop and no friction rub.   No murmur heard. Pulmonary/Chest: Effort normal. No respiratory distress. She has decreased breath sounds. She has no wheezes. She has no rales. She exhibits no tenderness.  Abdominal: Soft. Bowel sounds are normal. She exhibits no distension. There is no tenderness.  Musculoskeletal: Normal range of motion. She exhibits no edema or tenderness.  Lymphadenopathy:    She has no cervical adenopathy.  Neurological: She is alert and oriented to person, place, and time. Coordination normal.  Skin: Skin is warm and dry. No rash noted. No erythema.  Psychiatric: She has a normal mood and affect. Her behavior is normal. Judgment and thought content normal.

## 2014-12-31 NOTE — Assessment & Plan Note (Signed)
Reports that she uses oxygen at home No respiratory distress on room air in the office today Smoking cessation recommended

## 2014-12-31 NOTE — Assessment & Plan Note (Signed)
We have encouraged her to continue to work on weaning her cigarettes and smoking cessation. She will continue to work on this and does not want any assistance with chantix.  

## 2014-12-31 NOTE — Patient Instructions (Addendum)
You are doing well. No medication changes were made.  Your cholesterol is excellent Your sugars are excellent Your CT scan of the heart is excellent (no blockages) Little blockage are the neck area, mild  Please call us if you have new issues that need to be addressed before your next appt.    Steps to Quit Smoking  Smoking tobacco can be harmful to your health and can affect almost every organ in your body. Smoking puts you, and those around you, at risk for developing many serious chronic diseases. Quitting smoking is difficult, but it is one of the best things that you can do for your health. It is never too late to quit. WHAT ARE THE BENEFITS OF QUITTING SMOKING? When you quit smoking, you lower your risk of developing serious diseases and conditions, such as:  Lung cancer or lung disease, such as COPD.  Heart disease.  Stroke.  Heart attack.  Infertility.  Osteoporosis and bone fractures. Additionally, symptoms such as coughing, wheezing, and shortness of breath may get better when you quit. You may also find that you get sick less often because your body is stronger at fighting off colds and infections. If you are pregnant, quitting smoking can help to reduce your chances of having a baby of low birth weight. HOW DO I GET READY TO QUIT? When you decide to quit smoking, create a plan to make sure that you are successful. Before you quit:  Pick a date to quit. Set a date within the next two weeks to give you time to prepare.  Write down the reasons why you are quitting. Keep this list in places where you will see it often, such as on your bathroom mirror or in your car or wallet.  Identify the people, places, things, and activities that make you want to smoke (triggers) and avoid them. Make sure to take these actions:  Throw away all cigarettes at home, at work, and in your car.  Throw away smoking accessories, such as Scientist, research (medical).  Clean your car and make sure  to empty the ashtray.  Clean your home, including curtains and carpets.  Tell your family, friends, and coworkers that you are quitting. Support from your loved ones can make quitting easier.  Talk with your health care provider about your options for quitting smoking.  Find out what treatment options are covered by your health insurance. WHAT STRATEGIES CAN I USE TO QUIT SMOKING?  Talk with your healthcare provider about different strategies to quit smoking. Some strategies include:  Quitting smoking altogether instead of gradually lessening how much you smoke over a period of time. Research shows that quitting "cold [place]" is more successful than gradually quitting.  Attending in-person counseling to help you build problem-solving skills. You are more likely to have success in quitting if you attend several counseling sessions. Even short sessions of 10 minutes can be effective.  Finding resources and support systems that can help you to quit smoking and remain smoke-free after you quit. These resources are most helpful when you use them often. They can include:  Online chats with a Social worker.  Telephone quitlines.  Printed Furniture conservator/restorer.  Support groups or group counseling.  Text messaging programs.  Mobile phone applications.  Taking medicines to help you quit smoking. (If you are pregnant or breastfeeding, talk with your health care provider first.) Some medicines contain nicotine and some do not. Both types of medicines help with cravings, but the medicines that include nicotine  help to relieve withdrawal symptoms. Your health care provider may recommend:  Nicotine patches, gum, or lozenges.  Nicotine inhalers or sprays.  Non-nicotine medicine that is taken by mouth. Talk with your health care provider about combining strategies, such as taking medicines while you are also receiving in-person counseling. Using these two strategies together makes you more likely to  succeed in quitting than if you used either strategy on its own. If you are pregnant or breastfeeding, talk with your health care provider about finding counseling or other support strategies to quit smoking. Do not take medicine to help you quit smoking unless told to do so by your health care provider. WHAT THINGS CAN I DO TO MAKE IT EASIER TO QUIT? Quitting smoking might feel overwhelming at first, but there is a lot that you can do to make it easier. Take these important actions:  Reach out to your family and friends and ask that they support and encourage you during this time. Call telephone quitlines, reach out to support groups, or work with a counselor for support.  Ask people who smoke to avoid smoking around you.  Avoid places that trigger you to smoke, such as bars, parties, or smoke-break areas at work.  Spend time around people who do not smoke.  Lessen stress in your life, because stress can be a smoking trigger for some people. To lessen stress, try:  Exercising regularly.  Deep-breathing exercises.  Yoga.  Meditating.  Performing a body scan. This involves closing your eyes, scanning your body from head to toe, and noticing which parts of your body are particularly tense. Purposefully relax the muscles in those areas.  Download or purchase mobile phone or tablet apps (applications) that can help you stick to your quit plan by providing reminders, tips, and encouragement. There are many free apps, such as QuitGuide from the [employer] Office manager for Disease Control and Prevention). You can find other support for quitting smoking (smoking cessation) through smokefree.gov and other websites. HOW WILL I FEEL WHEN I QUIT SMOKING? Within the first 24 hours of quitting smoking, you may start to feel some withdrawal symptoms. These symptoms are usually most noticeable 2-3 days after quitting, but they usually do not last beyond 2-3 weeks. Changes or symptoms that you might experience  include:  Mood swings.  Restlessness, anxiety, or irritation.  Difficulty concentrating.  Dizziness.  Strong cravings for sugary foods in addition to nicotine.  Mild weight gain.  Constipation.  Nausea.  Coughing or a sore throat.  Changes in how your medicines work in your body.  A depressed mood.  Difficulty sleeping (insomnia). After the first 2-3 weeks of quitting, you may start to notice more positive results, such as:  Improved sense of smell and taste.  Decreased coughing and sore throat.  Slower heart rate.  Lower blood pressure.  Clearer skin.  The ability to breathe more easily.  Fewer sick days. Quitting smoking is very challenging for most people. Do not get discouraged if you are not successful the first time. Some people need to make many attempts to quit before they achieve long-term success. Do your best to stick to your quit plan, and talk with your health care provider if you have any questions or concerns.   This information is not intended to replace advice given to you by your health care provider. Make sure you discuss any questions you have with your health care provider.   Document Released: 02/06/2001 Document Revised: 06/29/2014 Document Reviewed: 06/29/2014 Elsevier Interactive  Patient Education 2016 [place] WHAT IS SECONDHAND SMOKE? Secondhand smoke is smoke that comes from burning tobacco. It could be the smoke from a cigarette, a pipe, or a cigar. Even if you are not the one smoking, secondhand smoke exposes you to the dangers of smoking. This is called involuntary, or passive, smoking. There are two types of secondhand smoke:  Sidestream smoke is the smoke that comes off the lighted end of a cigarette, pipe, or cigar.  This type of smoke has the highest amount of cancer-causing agents (carcinogens).  The particles in sidestream smoke are smaller. They get into your lungs more easily.  Mainstream smoke is  the smoke that is exhaled by a person who is smoking.  This type of smoke is also dangerous to your health. HOW CAN SECONDHAND SMOKE AFFECT MY HEALTH? Studies show that there is no safe level of secondhand smoke. This smoke contains thousands of chemicals. At least 34 of them are known to cause cancer. Secondhand smoke can also cause many other health problems. It has been linked to:  Lung cancer.  Cancer of the voice box (larynx) or throat.  Cancer of the sinuses.  Brain cancer.  Bladder cancer.  Stomach cancer.  Breast cancer.  White blood cell cancers (lymphoma and leukemia).  Brain and liver tumors in children.  Heart disease and stroke in adults.  Pregnancy loss (miscarriage).  Diseases in children, such as:  Asthma.  Lung infections.  Ear infections.  Sudden infant death syndrome (SIDS).  Slow growth. WHERE CAN I BE AT RISK FOR EXPOSURE TO SECONDHAND SMOKE?   For adults, the workplace is the main source of exposure to secondhand smoke.  Your workplace should have a policy separating smoking areas from nonsmoking areas.  Smoking areas should have a system for ventilating and cleaning the air.  For children, the home may be the most dangerous place for exposure to secondhand smoke.  Children who live in apartment buildings may be at risk from smoke drifting from hallways or other people's homes.  For everyone, many public places are possible sources of exposure to secondhand smoke.  These places include restaurants, shopping centers, and parks. HOW CAN I REDUCE MY RISK FOR EXPOSURE TO SECONDHAND SMOKE? The most important thing you can do is not smoke. Discourage family members from smoking. Other ways to reduce exposure for you and your family include the following:  Keep your home smoke free.  Make sure your child care providers do not smoke.  Warn your child about the dangers of smoking and secondhand smoke.  Do not allow smoking in your car. When  someone smokes in a car, all the damaging chemicals from the smoke are confined in a small area.  Avoid public places where smoking is allowed.   This information is not intended to replace advice given to you by your health care provider. Make sure you discuss any questions you have with your health care provider.   Document Released: 03/22/2004 Document Revised: 03/05/2014 Document Reviewed: 05/29/2013 Elsevier Interactive Patient Education 2016 [employer]. Smoking Hazards Smoking cigarettes is extremely bad for your health. Tobacco smoke has over 200 known poisons in it. It contains the poisonous gases nitrogen oxide and carbon monoxide. There are over 60 chemicals in tobacco smoke that cause cancer. Some of the chemicals found in cigarette smoke include:   Cyanide.   Benzene.   Formaldehyde.   Methanol (wood alcohol).   Acetylene (fuel used in welding torches).   Ammonia.  Even smoking lightly shortens your life expectancy by several years. You can greatly reduce the risk of medical problems for you and your family by stopping now. Smoking is the most preventable cause of death and disease in our society. Within days of quitting smoking, your circulation improves, you decrease the risk of having a heart attack, and your lung capacity improves. There may be some increased phlegm in the first few days after quitting, and it may take months for your lungs to clear up completely. Quitting for 10 years reduces your risk of developing lung cancer to almost that of a nonsmoker.  WHAT ARE THE RISKS OF SMOKING? Cigarette smokers have an increased risk of many serious medical problems, including:  Lung cancer.   Lung disease (such as pneumonia, bronchitis, and emphysema).   Heart attack and chest pain due to the heart not getting enough oxygen (angina).   Heart disease and peripheral blood vessel disease.   Hypertension.   Stroke.   Oral cancer (cancer of the lip,  mouth, or voice box).   Bladder cancer.   Pancreatic cancer.   Cervical cancer.   Pregnancy complications, including premature birth.   Stillbirths and smaller newborn babies, birth defects, and genetic damage to sperm.   Early menopause.   Lower estrogen level for women.   Infertility.   Facial wrinkles.   Blindness.   Increased risk of broken bones (fractures).   Senile dementia.   Stomach ulcers and internal bleeding.   Delayed wound healing and increased risk of complications during surgery. Because of secondhand smoke exposure, children of smokers have an increased risk of the following:   Sudden infant death syndrome (SIDS).   Respiratory infections.   Lung cancer.   Heart disease.   Ear infections.  WHY IS SMOKING ADDICTIVE? Nicotine is the chemical agent in tobacco that is capable of causing addiction or dependence. When you smoke and inhale, nicotine is absorbed rapidly into the bloodstream through your lungs. Both inhaled and noninhaled nicotine may be addictive.  WHAT ARE THE BENEFITS OF QUITTING?  There are many health benefits to quitting smoking. Some are:   The likelihood of developing cancer and heart disease decreases. Health improvements are seen almost immediately.   Blood pressure, pulse rate, and breathing patterns start returning to normal soon after quitting.   People who quit may see an improvement in their overall quality of life.  HOW DO YOU QUIT SMOKING? Smoking is an addiction with both physical and psychological effects, and longtime habits can be hard to change. Your health care provider can recommend:  Programs and community resources, which may include group support, education, or therapy.  Replacement products, such as patches, gum, and nasal sprays. Use these products only as directed. Do not replace cigarette smoking with electronic cigarettes (commonly called e-cigarettes). The safety of e-cigarettes is  unknown, and some may contain harmful chemicals. FOR MORE INFORMATION  American Lung Association: www.lung.org  American Cancer Society: www.cancer.org   This information is not intended to replace advice given to you by your health care provider. Make sure you discuss any questions you have with your health care provider.   Document Released: 03/22/2004 Document Revised: 12/03/2012 Document Reviewed: 08/04/2012 Elsevier Interactive Patient Education 2016 [employer]. Smoking Cessation, Tips for Success If you are ready to quit smoking, congratulations! You have chosen to help yourself be healthier. Cigarettes bring nicotine, tar, carbon monoxide, and other irritants into your body. Your lungs, heart, and blood vessels will be able to  work better without these poisons. There are many different ways to quit smoking. Nicotine gum, nicotine patches, a nicotine inhaler, or nicotine nasal spray can help with physical craving. Hypnosis, support groups, and medicines help break the habit of smoking. WHAT THINGS CAN I DO TO MAKE QUITTING EASIER?  Here are some tips to help you quit for good:  Pick a date when you will quit smoking completely. Tell all of your friends and family about your plan to quit on that date.  Do not try to slowly cut down on the number of cigarettes you are smoking. Pick a quit date and quit smoking completely starting on that day.  Throw away all cigarettes.   Clean and remove all ashtrays from your home, work, and car.  On a card, write down your reasons for quitting. Carry the card with you and read it when you get the urge to smoke.  Cleanse your body of nicotine. Drink enough water and fluids to keep your urine clear or pale yellow. Do this after quitting to flush the nicotine from your body.  Learn to predict your moods. Do not let a bad situation be your excuse to have a cigarette. Some situations in your life might tempt you into wanting a cigarette.  Never  have "just one" cigarette. It leads to wanting another and another. Remind yourself of your decision to quit.  Change habits associated with smoking. If you smoked while driving or when feeling stressed, try other activities to replace smoking. Stand up when drinking your coffee. Brush your teeth after eating. Sit in a different chair when you read the paper. Avoid alcohol while trying to quit, and try to drink fewer caffeinated beverages. Alcohol and caffeine may urge you to smoke.  Avoid foods and drinks that can trigger a desire to smoke, such as sugary or spicy foods and alcohol.  Ask people who smoke not to smoke around you.  Have something planned to do right after eating or having a cup of coffee. For example, plan to take a walk or exercise.  Try a relaxation exercise to calm you down and decrease your stress. Remember, you may be tense and nervous for the first 2 weeks after you quit, but this will pass.  Find new activities to keep your hands busy. Play with a pen, coin, or rubber band. Doodle or draw things on paper.  Brush your teeth right after eating. This will help cut down on the craving for the taste of tobacco after meals. You can also try mouthwash.   Use oral substitutes in place of cigarettes. Try using lemon drops, carrots, cinnamon sticks, or chewing gum. Keep them handy so they are available when you have the urge to smoke.  When you have the urge to smoke, try deep breathing.  Designate your home as a nonsmoking area.  If you are a heavy smoker, ask your health care provider about a prescription for nicotine chewing gum. It can ease your withdrawal from nicotine.  Reward yourself. Set aside the cigarette money you save and buy yourself something nice.  Look for support from others. Join a support group or smoking cessation program. Ask someone at home or at work to help you with your plan to quit smoking.  Always ask yourself, "Do I need this cigarette or is this  just a reflex?" Tell yourself, "Today, I choose not to smoke," or "I do not want to smoke." You are reminding yourself of your decision to quit.  Do not replace cigarette smoking with electronic cigarettes (commonly called e-cigarettes). The safety of e-cigarettes is unknown, and some may contain harmful chemicals.  If you relapse, do not give up! Plan ahead and think about what you will do the next time you get the urge to smoke. HOW WILL I FEEL WHEN I QUIT SMOKING? You may have symptoms of withdrawal because your body is used to nicotine (the addictive substance in cigarettes). You may crave cigarettes, be irritable, feel very hungry, cough often, get headaches, or have difficulty concentrating. The withdrawal symptoms are only temporary. They are strongest when you first quit but will go away within 10-14 days. When withdrawal symptoms occur, stay in control. Think about your reasons for quitting. Remind yourself that these are signs that your body is healing and getting used to being without cigarettes. Remember that withdrawal symptoms are easier to treat than the major diseases that smoking can cause.  Even after the withdrawal is over, expect periodic urges to smoke. However, these cravings are generally short lived and will go away whether you smoke or not. Do not smoke! WHAT RESOURCES ARE AVAILABLE TO HELP ME QUIT SMOKING? Your health care provider can direct you to community resources or hospitals for support, which may include:  Group support.  Education.  Hypnosis.  Therapy.   This information is not intended to replace advice given to you by your health care provider. Make sure you discuss any questions you have with your health care provider.   Document Released: 11/11/2003 Document Revised: 03/05/2014 Document Reviewed: 07/31/2012 Elsevier Interactive Patient Education [employer].

## 2015-01-03 DIAGNOSIS — F3189 Other bipolar disorder: Secondary | ICD-10-CM | POA: Diagnosis not present

## 2015-01-04 DIAGNOSIS — F3189 Other bipolar disorder: Secondary | ICD-10-CM | POA: Diagnosis not present

## 2015-01-05 ENCOUNTER — Ambulatory Visit: Payer: Commercial Managed Care - HMO | Admitting: Psychiatry

## 2015-01-05 ENCOUNTER — Ambulatory Visit: Payer: Commercial Managed Care - HMO | Admitting: Anesthesiology

## 2015-01-10 ENCOUNTER — Ambulatory Visit: Payer: Commercial Managed Care - HMO | Admitting: Licensed Clinical Social Worker

## 2015-01-10 DIAGNOSIS — F3189 Other bipolar disorder: Secondary | ICD-10-CM | POA: Diagnosis not present

## 2015-01-11 ENCOUNTER — Ambulatory Visit: Payer: Self-pay | Admitting: Family Medicine

## 2015-01-11 DIAGNOSIS — F3189 Other bipolar disorder: Secondary | ICD-10-CM | POA: Diagnosis not present

## 2015-01-12 ENCOUNTER — Ambulatory Visit: Payer: Commercial Managed Care - HMO | Attending: Anesthesiology | Admitting: Anesthesiology

## 2015-01-12 ENCOUNTER — Ambulatory Visit: Payer: Commercial Managed Care - HMO | Admitting: Psychiatry

## 2015-01-12 ENCOUNTER — Encounter: Payer: Self-pay | Admitting: Anesthesiology

## 2015-01-12 VITALS — BP 118/65 | HR 102 | Temp 97.9°F | Resp 20 | Ht 61.0 in | Wt 240.0 lb

## 2015-01-12 DIAGNOSIS — G8929 Other chronic pain: Secondary | ICD-10-CM | POA: Insufficient documentation

## 2015-01-12 DIAGNOSIS — F329 Major depressive disorder, single episode, unspecified: Secondary | ICD-10-CM | POA: Insufficient documentation

## 2015-01-12 DIAGNOSIS — M5136 Other intervertebral disc degeneration, lumbar region: Secondary | ICD-10-CM | POA: Insufficient documentation

## 2015-01-12 DIAGNOSIS — M549 Dorsalgia, unspecified: Secondary | ICD-10-CM

## 2015-01-12 DIAGNOSIS — M5442 Lumbago with sciatica, left side: Secondary | ICD-10-CM | POA: Diagnosis not present

## 2015-01-12 DIAGNOSIS — F3189 Other bipolar disorder: Secondary | ICD-10-CM | POA: Diagnosis not present

## 2015-01-12 DIAGNOSIS — M5137 Other intervertebral disc degeneration, lumbosacral region: Secondary | ICD-10-CM

## 2015-01-12 DIAGNOSIS — M5441 Lumbago with sciatica, right side: Secondary | ICD-10-CM | POA: Diagnosis not present

## 2015-01-12 DIAGNOSIS — M545 Low back pain: Secondary | ICD-10-CM | POA: Insufficient documentation

## 2015-01-12 DIAGNOSIS — M47816 Spondylosis without myelopathy or radiculopathy, lumbar region: Secondary | ICD-10-CM | POA: Diagnosis not present

## 2015-01-12 MED ORDER — TRAZODONE HCL 100 MG PO TABS: 100.0000 mg | ORAL_TABLET | Freq: Two times a day (BID) | ORAL | Status: DC

## 2015-01-12 NOTE — Patient Instructions (Signed)
A prescription for Trazodone was sent to your pharmacy.

## 2015-01-12 NOTE — Progress Notes (Signed)
Safety precautions to be maintained throughout the outpatient stay will include: orient to surroundings, keep bed in low position, maintain call bell within reach at all times, provide assistance with transfer out of bed and ambulation.  

## 2015-01-12 NOTE — Progress Notes (Signed)
   Subjective:    Patient ID: Nicole Austin, female    DOB: 1960-08-26, 54 y.o.   MRN: [id number]  HPI  This patient returned to the clinic today indicating that she is not getting significant pain relief from the interventions especially the epidural steroid injections We'll therefore discontinue all interventions at this time She appears to have multiple issues which are possibly interfering with her progress She constantly talks about the death of her mother and the fact that her children do not interact with her Fortunately she is under the care of Dr. Faith Rogue of psychiatry I spent some time trying to comfort her and offered her some new perspectives for optimism She requests opioids but it is clear that these drugs are  contraindicated given her other psychogenic issues I will therefore consider given her trazodone 100 mg twice a day  Review of Systems  Constitutional: Negative.   HENT: Negative.   Eyes: Negative.   Respiratory: Negative.   Cardiovascular: Negative.   Gastrointestinal: Negative.   Endocrine: Negative.   Genitourinary: Negative.   Musculoskeletal: Negative.   Skin: Negative.   Allergic/Immunologic: Negative.   Hematological: Negative.   Psychiatric/Behavioral: Positive for behavioral problems and agitation. Negative for suicidal ideas, hallucinations, confusion, sleep disturbance, dysphoric mood and decreased concentration. The patient is not nervous/anxious and is not hyperactive.        Patient is very depressed and has some other serious issues affecting her emotions and her psychogenic status She is constantly talking about the loss of her mother and also the fact that her children do not interact with her She did not express any suicidal ideations other than the need to be with her mother very soon.       Objective:   Physical Exam  Cardiovascular:  Patient appeared to be under significant emotional stress Her blood pressure is 118/55  mmHg Pulse is 102 bpm Equal and Regular Heart sounds 1 and 2 were heard in all areas There were no audible murmurs Temperature was 97.61F Respirations were 20 breaths per minute SPO2 was 97% Chest is clinically clear There were no adventitious sounds Abdomen was soft and nontender There was some abdominal distention There is no palpable organomegaly No significant lymphadenopathy  Pupils are equal and reactive Cranial nerves are intact No new neurological or musculoskeletal findings  Nursing note and vitals reviewed.         Assessment & Plan:    Assessment 1 chronic low back pain 2 lumbar degenerative disc disease 3 major depression 4 history of polysubstance abuse   Plan of management 1 Will perform all interventions at this time 2 Will begin her on trazodone 100 mg twice a day but give her 60 tablets 3 Will ask her to continue to follow up with Dr. Jimmye Norman for continued care   Established patient   level III   Stand Tawny Hopping.D.

## 2015-01-13 ENCOUNTER — Other Ambulatory Visit: Payer: Self-pay | Admitting: Family Medicine

## 2015-01-13 ENCOUNTER — Ambulatory Visit (INDEPENDENT_AMBULATORY_CARE_PROVIDER_SITE_OTHER): Payer: Commercial Managed Care - HMO | Admitting: Psychiatry

## 2015-01-13 ENCOUNTER — Telehealth: Payer: Self-pay | Admitting: Anesthesiology

## 2015-01-13 ENCOUNTER — Telehealth: Payer: Self-pay | Admitting: Family Medicine

## 2015-01-13 ENCOUNTER — Encounter: Payer: Self-pay | Admitting: Psychiatry

## 2015-01-13 VITALS — BP 118/72 | HR 106 | Temp 99.0°F | Ht 61.0 in | Wt 246.6 lb

## 2015-01-13 DIAGNOSIS — F4310 Post-traumatic stress disorder, unspecified: Secondary | ICD-10-CM

## 2015-01-13 DIAGNOSIS — F3176 Bipolar disorder, in full remission, most recent episode depressed: Secondary | ICD-10-CM | POA: Diagnosis not present

## 2015-01-13 DIAGNOSIS — F3189 Other bipolar disorder: Secondary | ICD-10-CM | POA: Diagnosis not present

## 2015-01-13 DIAGNOSIS — M25561 Pain in right knee: Secondary | ICD-10-CM

## 2015-01-13 MED ORDER — ALPRAZOLAM 1 MG PO TABS: 1.0000 mg | ORAL_TABLET | Freq: Four times a day (QID) | ORAL | Status: DC | PRN

## 2015-01-13 MED ORDER — QUETIAPINE FUMARATE ER 300 MG PO TB24: 300.0000 mg | ORAL_TABLET | Freq: Every evening | ORAL | Status: DC

## 2015-01-13 MED ORDER — ESCITALOPRAM OXALATE 20 MG PO TABS: 20.0000 mg | ORAL_TABLET | Freq: Every day | ORAL | Status: DC

## 2015-01-13 NOTE — Telephone Encounter (Signed)
Pt needs a referral to Dr Marylee Floras at [facility] for her right knee and hip. Pt is having a lot pain.

## 2015-01-13 NOTE — Progress Notes (Signed)
BH MD/PA/NP OP Progress Note  01/13/2015 12:31 PM Nicole Austin  MRN:  [id number]  Subjective:  Patient presents or bipolar disorder and post right stress disorder. She states overall things have been somewhat stressful for her. She spends most of the appointment talking about frustration with her pain. She states that she is not finding relief from the steroid injections. She states that her pain management doctor did give her trazodone and showed me a bottle of trazodone with the instructions 100 mg twice a day. She states that she has not found any benefit of that medication in regards to her pain. She feels like the Seroquel is been effective for stabilizing her mood and stated that the last increase at the last visit was helpful.  He is to use the alprazolam to help her with anxiety. Despite frustrations for pain she does indicate that a good thing as occurred and that she's been approved for section 8 housing. She states she's got her voucher and she has found a place where she wants to live but she is finding difficulty establishing contact with the manager for that house. Chief Complaint: pain Chief Complaint    Medication Refill; Follow-up     Visit Diagnosis:     ICD-9-CM ICD-10-CM   1. Bipolar disorder, in full remission, most recent episode depressed ([place]) 296.56 F31.76 escitalopram (LEXAPRO) 20 MG tablet     QUEtiapine (SEROQUEL XR) 300 MG 24 hr tablet  2. PTSD (post-traumatic stress disorder) 309.81 F43.10 escitalopram (LEXAPRO) 20 MG tablet    Past Medical History:  Past Medical History  Diagnosis Date  . COPD (chronic obstructive pulmonary disease) ([place])   . PTSD (post-traumatic stress disorder)   . Rhabdomyolysis   . Bipolar 1 disorder ([place])   . Arthritis   . Stroke ([place])   . MI (myocardial infarction) ([place])   . Depression   . ADHD (attention deficit hyperactivity disorder)   . Asthma     Past Surgical History  Procedure Laterality Date  . Replacement total  knee Left    Family History:  Family History  Problem Relation Age of Onset  . Hernia Mother   . Heart disease Mother   . OCD Mother   . Diabetes Mother   . Parkinson's disease Father   . Bipolar disorder Sister   . Schizophrenia Sister   . ADD / ADHD Sister   . Alcohol abuse Brother   . Bipolar disorder Sister   . Paranoid behavior Sister   . ADD / ADHD Sister    Social History:  Social History   Social History  . Marital Status: Divorced    Spouse Name: N/A  . Number of Children: N/A  . Years of Education: N/A   Social History Main Topics  . Smoking status: Current Every Day Smoker -- 0.50 packs/day    Types: Cigarettes    Start date: 10/11/1984  . Smokeless tobacco: Never Used  . Alcohol Use: No  . Drug Use: No  . Sexual Activity: No   Other Topics Concern  . None   Social History Narrative   Additional History:   Assessment:   Musculoskeletal: Strength & Muscle Tone: within normal limits Gait & Station: normal Patient leans: N/A  Psychiatric Specialty Exam: Depression        Associated symptoms include does not have insomnia and no suicidal ideas.  Past medical history includes anxiety.   Insomnia PMH includes: no depression.  Anxiety Symptoms include nervous/anxious behavior. Patient reports no  insomnia or suicidal ideas.      Review of Systems  Psychiatric/Behavioral: Negative for depression, suicidal ideas, hallucinations, memory loss and substance abuse. The patient is nervous/anxious. The patient does not have insomnia.     Blood pressure 118/72, pulse 106, temperature 99 F (37.2 C), temperature source Tympanic, height 5\' 1"  (1.549 m), weight 246 lb 9.6 oz (111.857 kg), SpO2 91 %.Body mass index is 46.62 kg/(m^2).  General Appearance: Well Groomed  Eye Contact:  Good  Speech:  Clear and Coherent and Normal Rate  Volume:  Normal  Mood:  not good  Affect:  Constricted  Thought Process:  Linear  Orientation:  Full (Time, Place, and  Person)  Thought Content:  Negative  Suicidal Thoughts:  No  Homicidal Thoughts:  No  Memory:  Immediate;   Good Recent;   Good Remote;   Good  Judgement:  Good  Insight:  Good  Psychomotor Activity:  Negative  Concentration:  Good  Recall:  Good  Fund of Knowledge: Good  Language: Good  Akathisia:  Negative  Handed:  Right unknown  AIMS (if indicated):  Not done  Assets:  Communication Skills Housing Social Support  ADL's:  Intact  Cognition: WNL  Sleep:  Good   Is the patient at risk to self?  No. Has the patient been a risk to self in the past 6 months?  No. Has the patient been a risk to self within the distant past?  Yes.   Is the patient a risk to others?  No. Has the patient been a risk to others in the past 6 months?  No. Has the patient been a risk to others within the distant past?  No.  Current Medications: Current Outpatient Prescriptions  Medication Sig Dispense Refill  . albuterol (PROVENTIL HFA;VENTOLIN HFA) 108 (90 BASE) MCG/ACT inhaler Inhale 1-2 puffs into the lungs every 6 (six) hours as needed for wheezing or shortness of breath. 6.7 g 2  . cyclobenzaprine (FLEXERIL) 5 MG tablet Take 2 tablets (10 mg total) by mouth 3 (three) times daily as needed for muscle spasms. 90 tablet 5  . divalproex (DEPAKOTE) 250 MG DR tablet Take 1 tablet (250 mg total) by mouth 2 (two) times daily. 60 tablet 1  . escitalopram (LEXAPRO) 20 MG tablet Take 1 tablet (20 mg total) by mouth daily. 30 tablet 2  . lidocaine (XYLOCAINE) 5 % ointment Apply 1 application topically as needed for mild pain.    Marland Kitchen LYRICA 200 MG capsule Take 1 capsule (200 mg total) by mouth 2 (two) times daily. 60 capsule 5  . Misc Natural Products (OSTEO BI-FLEX ADV TRIPLE ST) TABS Take 1 tablet by mouth 2 (two) times daily.    . Multiple Vitamin (MULTIVITAMIN WITH MINERALS) TABS tablet Take 1 tablet by mouth daily.    . QUEtiapine (SEROQUEL XR) 300 MG 24 hr tablet Take 1 tablet (300 mg total) by mouth  every evening. 30 tablet 2  . traMADol (ULTRAM) 50 MG tablet Take 1 tablet (50 mg total) by mouth 3 (three) times daily after meals. 42 tablet 0  . traZODone (DESYREL) 100 MG tablet Take 1 tablet (100 mg total) by mouth 2 (two) times daily. 60 tablet 1  . ALPRAZolam (XANAX) 1 MG tablet Take 1 tablet (1 mg total) by mouth 4 (four) times daily as needed for anxiety. 120 tablet 2   No current facility-administered medications for this visit.    Medical Decision Making:  Established Problem, Stable/Improving (1) and Review  or order clinical lab tests (1)  Treatment Plan Summary:Medication management at this time her moods are largely stable and her biggest complaint right now seems to be pain management.  Bipolar disorder-We will increase her Seroquel XR to 300 mg at bedtime. We'll increase her alprazolam to 1 mg 4 times a day as needed., Continue her Lexapro 20 mg a day. She is on Depakote 250 mg twice a day prescribed by her neurologist.   Cherlyn Roberts does have some social supports. She was offered a referral to grief counseling but declines this at this time.   PTSD-stable. Will continue the Lexapro and Seroquel.  Patient will follow up in 3 months. She's been encouraged call with any questions or concerns prior to her next appointment.  Faith Rogue 01/13/2015, 12:31 PM

## 2015-01-13 NOTE — Telephone Encounter (Signed)
Done

## 2015-01-13 NOTE — Telephone Encounter (Signed)
Forgot to get Tramadol HCL 50mg  refill when she was here for appt/ can this be called in ?

## 2015-01-14 ENCOUNTER — Other Ambulatory Visit: Payer: Self-pay

## 2015-01-14 DIAGNOSIS — M25559 Pain in unspecified hip: Secondary | ICD-10-CM | POA: Diagnosis not present

## 2015-01-14 DIAGNOSIS — F3189 Other bipolar disorder: Secondary | ICD-10-CM | POA: Diagnosis not present

## 2015-01-14 NOTE — Telephone Encounter (Signed)
Pt informed

## 2015-01-14 NOTE — Telephone Encounter (Signed)
Got a fax from [facility] requesting a refill for this patient's Tramadol.  Refill request was sent to Dr. Bobetta Lime for approval and submission.

## 2015-01-14 NOTE — Telephone Encounter (Signed)
Spoke with Dr. Idelia Salm, he changed the meds from Tramadol to Trazadone. Pt informed of this, asked if he could change it back to Tramadol.

## 2015-01-14 NOTE — Telephone Encounter (Signed)
Pt informed that meds will not be changed at this time, that she should give it more time. Pt states she isnt going to take the Trazadone.

## 2015-01-15 ENCOUNTER — Emergency Department
Admission: EM | Admit: 2015-01-15 | Discharge: 2015-01-15 | Disposition: A | Discharge status: Home / Self Care | Payer: Commercial Managed Care - HMO | Attending: Emergency Medicine | Admitting: Emergency Medicine

## 2015-01-15 ENCOUNTER — Emergency Department: Payer: Commercial Managed Care - HMO

## 2015-01-15 DIAGNOSIS — S79912A Unspecified injury of left hip, initial encounter: Secondary | ICD-10-CM | POA: Diagnosis not present

## 2015-01-15 DIAGNOSIS — F17210 Nicotine dependence, cigarettes, uncomplicated: Secondary | ICD-10-CM | POA: Insufficient documentation

## 2015-01-15 DIAGNOSIS — M25552 Pain in left hip: Secondary | ICD-10-CM | POA: Diagnosis not present

## 2015-01-15 DIAGNOSIS — Z79899 Other long term (current) drug therapy: Secondary | ICD-10-CM | POA: Diagnosis not present

## 2015-01-15 DIAGNOSIS — G8929 Other chronic pain: Secondary | ICD-10-CM | POA: Diagnosis not present

## 2015-01-15 MED ORDER — CYCLOBENZAPRINE HCL 10 MG PO TABS
5.0000 mg | ORAL_TABLET | Freq: Once | ORAL | Status: AC
  Administered 2015-01-15: 5 mg via ORAL
  Filled 2015-01-15: qty 1

## 2015-01-15 MED ORDER — KETOROLAC TROMETHAMINE 30 MG/ML IJ SOLN
60.0000 mg | Freq: Once | INTRAMUSCULAR | Status: AC
  Administered 2015-01-15: 60 mg via INTRAMUSCULAR
  Filled 2015-01-15: qty 2

## 2015-01-15 NOTE — ED Notes (Signed)
Pt went to X-Ray.  

## 2015-01-15 NOTE — ED Notes (Signed)
Pt has chronic left hip pain but over the last week having increasing pain to the area. Pt reports after walking through grocery store yesterday the pain is worse. EMS reports she walked to the ambulance with no difficulty.

## 2015-01-15 NOTE — ED Notes (Signed)
Pt returned from X Ray.

## 2015-01-15 NOTE — ED Provider Notes (Signed)
[facility] Emergency Department Provider Note  ____________________________________________  Time seen: Approximately 3:49 AM  I have reviewed the triage vital signs and the nursing notes.   HISTORY  Chief Complaint Hip Pain    HPI Choyce Haverty is a 54 y.o. female who presents to the ED from home via EMS for chief complaint of left hip pain. Patient has chronic left hip pain but states over the past week she is experiencing increasing pain to the affected area. States after walking through a grocery store yesterday the pain has worsened. Denies recent trauma, injury or fall. Per EMS, patient ambulated without difficulty to the ambulance. Denies associated symptoms of numbness/tingling, weakness. Denies recent fever, chills, chest pain, shortness of breath, abdominal pain, nausea, vomiting, diarrhea. Nothing makes her pain better. Movement makes her pain worse.   Past Medical History  Diagnosis Date  . COPD (chronic obstructive pulmonary disease) ([place])   . PTSD (post-traumatic stress disorder)   . Rhabdomyolysis   . Bipolar 1 disorder ([place])   . Arthritis   . Stroke ([place])   . MI (myocardial infarction) ([place])   . Depression   . ADHD (attention deficit hyperactivity disorder)   . Asthma     Patient Active Problem List   Diagnosis Date Noted  . Chest pain 12/31/2014  . Bipolar I disorder, most recent episode depressed ([place]) 12/22/2014  . Bipolar affective disorder ([place]) 12/22/2014  . Morbid obesity ([place]) 12/22/2014  . Annual physical exam 12/22/2014  . Need for influenza vaccination 12/22/2014  . Encounter for screening for malignant neoplasm of colon 12/22/2014  . Encounter for screening for malignant neoplasm of cervix 12/22/2014  . Encounter for screening mammogram for malignant neoplasm of breast 12/22/2014  . Screening for osteoporosis 12/22/2014  . Rash and nonspecific skin eruption 11/16/2014  . Burn of finger 09/23/2014  . Anxiety  08/16/2014  . Affective bipolar disorder ([place]) 08/16/2014  . Gout 08/16/2014  . H/O gastrointestinal disease 08/16/2014  . Extreme obesity ([place]) 08/16/2014  . Muscle spasms of both lower extremities 08/16/2014  . Bipolar 1 disorder, depressed ([place]) 08/13/2014  . Back pain, chronic 08/13/2014  . Low back pain with sciatica 08/04/2014  . Compulsive tobacco user syndrome 08/04/2014  . Current tobacco use 08/04/2014  . Polysubstance abuse 07/04/2014  . Anxiety, generalized 11/24/2013  . Imbalance 11/24/2013  . Blurred vision 11/24/2013  . Cephalalgia 11/24/2013  . COPD, moderate ([place]) 11/18/2013  . Moderate COPD (chronic obstructive pulmonary disease) ([place]) 11/18/2013  . HPV (human papilloma virus) infection 07/17/2013  . Arthritis of knee, degenerative 07/15/2013  . H/O neoplasm 07/31/2011    Past Surgical History  Procedure Laterality Date  . Replacement total knee Left     Current Outpatient Rx  Name  Route  Sig  Dispense  Refill  . albuterol (PROVENTIL HFA;VENTOLIN HFA) 108 (90 BASE) MCG/ACT inhaler   Inhalation   Inhale 1-2 puffs into the lungs every 6 (six) hours as needed for wheezing or shortness of breath.   6.7 g   2   . ALPRAZolam (XANAX) 1 MG tablet   Oral   Take 1 tablet (1 mg total) by mouth 4 (four) times daily as needed for anxiety.   120 tablet   2   . cyclobenzaprine (FLEXERIL) 5 MG tablet   Oral   Take 2 tablets (10 mg total) by mouth 3 (three) times daily as needed for muscle spasms.   90 tablet   5   . divalproex (DEPAKOTE)  250 MG DR tablet   Oral   Take 1 tablet (250 mg total) by mouth 2 (two) times daily.   60 tablet   1   . escitalopram (LEXAPRO) 20 MG tablet   Oral   Take 1 tablet (20 mg total) by mouth daily.   30 tablet   2   . lidocaine (XYLOCAINE) 5 % ointment   Topical   Apply 1 application topically as needed for mild pain.         Marland Kitchen LYRICA 200 MG capsule   Oral   Take 1 capsule (200 mg total) by mouth 2 (two) times  daily.   60 capsule   5     Dispense as written.   . Misc Natural Products (OSTEO BI-FLEX ADV TRIPLE ST) TABS   Oral   Take 1 tablet by mouth 2 (two) times daily.         . Multiple Vitamin (MULTIVITAMIN WITH MINERALS) TABS tablet   Oral   Take 1 tablet by mouth daily.         . QUEtiapine (SEROQUEL XR) 300 MG 24 hr tablet   Oral   Take 1 tablet (300 mg total) by mouth every evening.   30 tablet   2   . traMADol (ULTRAM) 50 MG tablet   Oral   Take 1 tablet (50 mg total) by mouth 3 (three) times daily after meals.   42 tablet   0   . traZODone (DESYREL) 100 MG tablet   Oral   Take 1 tablet (100 mg total) by mouth 2 (two) times daily.   60 tablet   1     Allergies Codeine  Family History  Problem Relation Age of Onset  . Hernia Mother   . Heart disease Mother   . OCD Mother   . Diabetes Mother   . Parkinson's disease Father   . Bipolar disorder Sister   . Schizophrenia Sister   . ADD / ADHD Sister   . Alcohol abuse Brother   . Bipolar disorder Sister   . Paranoid behavior Sister   . ADD / ADHD Sister     Social History Social History  Substance Use Topics  . Smoking status: Current Every Day Smoker -- 0.50 packs/day    Types: Cigarettes    Start date: 10/11/1984  . Smokeless tobacco: Never Used  . Alcohol Use: No    Review of Systems Constitutional: No fever/chills Eyes: No visual changes. ENT: No sore throat. Cardiovascular: Denies chest pain. Respiratory: Denies shortness of breath. Gastrointestinal: No abdominal pain.  No nausea, no vomiting.  No diarrhea.  No constipation. Genitourinary: Negative for dysuria. Musculoskeletal: Positive for left hip pain. Negative for back pain. Skin: Negative for rash. Neurological: Negative for headaches, focal weakness or numbness.  10-point ROS otherwise negative.  ____________________________________________   PHYSICAL EXAM:  VITAL SIGNS: ED Triage Vitals  Enc Vitals Group     BP 01/15/15  0016 104/66 mmHg     Pulse Rate 01/15/15 0016 87     Resp 01/15/15 0016 18     Temp 01/15/15 0016 97.7 F (36.5 C)     Temp Source 01/15/15 0016 Oral     SpO2 01/15/15 0016 96 %     Weight 01/15/15 0016 245 lb (111.131 kg)     Height 01/15/15 0016 5\' 1"  (1.549 m)     Head Cir --      Peak Flow --      Pain Score 01/15/15  0004 10     Pain Loc --      Pain Edu? --      Excl. in [place]? --     Constitutional: Asleep, easily awakened for exam. Alert and oriented. Well appearing and in no acute distress. Eyes: Conjunctivae are normal. PERRL. EOMI. Head: Atraumatic. Nose: No congestion/rhinnorhea. Mouth/Throat: Mucous membranes are moist.  Oropharynx non-erythematous. Neck: No stridor.   Cardiovascular: Normal rate, regular rhythm. Grossly normal heart sounds.  Good peripheral circulation. Respiratory: Normal respiratory effort.  No retractions. Lungs CTAB. Gastrointestinal: Soft and nontender. No distention. No abdominal bruits. No CVA tenderness. Musculoskeletal: Left anterior hip tender to palpation. Full range of motion with mild pain. No external evidence of injury. There is no shortening or rotation.  No joint effusions. Neurologic:  Normal speech and language. No gross focal neurologic deficits are appreciated. No gait instability. Skin:  Skin is warm, dry and intact. No rash noted. Psychiatric: Mood and affect are normal. Speech and behavior are normal.  ____________________________________________   LABS (all labs ordered are listed, but only abnormal results are displayed)  Labs Reviewed - No data to display ____________________________________________  EKG  None ____________________________________________  RADIOLOGY  Left hip xrays (viewed by me, interpreted per Dr. Marisue Humble): No acute bony abnormality of the pelvis or left hip. ____________________________________________   PROCEDURES  Procedure(s) performed: None  Critical Care performed:  No  ____________________________________________   INITIAL IMPRESSION / ASSESSMENT AND PLAN / ED COURSE  Pertinent labs & imaging results that were available during my care of the patient were reviewed by me and considered in my medical decision making (see chart for details).  54 year old female with acute on chronic left hip pain which is nontraumatic. X-rays ordered at patient's request. Discussed with patient chronic pain policy; will treat with NSAIDs and muscle relaxer.  ----------------------------------------- 5:49 AM on 01/15/2015 -----------------------------------------  Patient sleeping. Updated her of imaging results. Strict return precautions given. Patient verbalizes understanding and agrees with plan of care. ____________________________________________   FINAL CLINICAL IMPRESSION(S) / ED DIAGNOSES  Final diagnoses:  Chronic hip pain, left      Paulette Blanch, MD 01/15/15 [phone]

## 2015-01-15 NOTE — Discharge Instructions (Signed)
1. You may take Tylenol and/or ibuprofen as needed for pain. 2. Please see your doctor for further medications. 3. Return to the ER for worsening symptoms, numbness/tingling, leg weakness or other concerns.  Chronic Pain Chronic pain can be defined as pain that is off and on and lasts for 3-6 months or longer. Many things cause chronic pain, which can make it difficult to make a diagnosis. There are many treatment options available for chronic pain. However, finding a treatment that works well for you may require trying various approaches until the right one is found. Many people benefit from a combination of two or more types of treatment to control their pain. SYMPTOMS  Chronic pain can occur anywhere in the body and can range from mild to very severe. Some types of chronic pain include:  Headache.  Low back pain.  Cancer pain.  Arthritis pain.  Neurogenic pain. This is pain resulting from damage to nerves. People with chronic pain may also have other symptoms such as:  Depression.  Anger.  Insomnia.  Anxiety. DIAGNOSIS  Your health care provider will help diagnose your condition over time. In many cases, the initial focus will be on excluding possible conditions that could be causing the pain. Depending on your symptoms, your health care provider may order tests to diagnose your condition. Some of these tests may include:   Blood tests.   CT scan.   MRI.   X-rays.   Ultrasounds.   Nerve conduction studies.  You may need to see a specialist.  TREATMENT  Finding treatment that works well may take time. You may be referred to a pain specialist. He or she may prescribe medicine or therapies, such as:   Mindful meditation or yoga.  Shots (injections) of numbing or pain-relieving medicines into the spine or area of pain.  Local electrical stimulation.  Acupuncture.   Massage therapy.   Aroma, color, light, or sound therapy.   Biofeedback.   Working  with a physical therapist to keep from getting stiff.   Regular, gentle exercise.   Cognitive or behavioral therapy.   Group support.  Sometimes, surgery may be recommended.  HOME CARE INSTRUCTIONS   Take all medicines as directed by your health care provider.   Lessen stress in your life by relaxing and doing things such as listening to calming music.   Exercise or be active as directed by your health care provider.   Eat a healthy diet and include things such as vegetables, fruits, fish, and lean meats in your diet.   Keep all follow-up appointments with your health care provider.   Attend a support group with others suffering from chronic pain. SEEK MEDICAL CARE IF:   Your pain gets worse.   You develop a new pain that was not there before.   You cannot tolerate medicines given to you by your health care provider.   You have new symptoms since your last visit with your health care provider.  SEEK IMMEDIATE MEDICAL CARE IF:   You feel weak.   You have decreased sensation or numbness.   You lose control of bowel or bladder function.   Your pain suddenly gets much worse.   You develop shaking.  You develop chills.  You develop confusion.  You develop chest pain.  You develop shortness of breath.  MAKE SURE YOU:  Understand these instructions.  Will watch your condition.  Will get help right away if you are not doing well or get worse.  This information is not intended to replace advice given to you by your health care provider. Make sure you discuss any questions you have with your health care provider.   Document Released: 11/04/2001 Document Revised: 10/15/2012 Document Reviewed: 08/08/2012 Elsevier Interactive Patient Education 2016 Elsevier Inc[employer]  Hip Pain Your hip is the joint between your upper legs and your lower pelvis. The bones, cartilage, tendons, and muscles of your hip joint perform a lot of work each day supporting your  body weight and allowing you to move around. Hip pain can range from a minor ache to severe pain in one or both of your hips. Pain may be felt on the inside of the hip joint near the groin, or the outside near the buttocks and upper thigh. You may have swelling or stiffness as well.  HOME CARE INSTRUCTIONS   Take medicines only as directed by your health care provider.  Apply ice to the injured area:  Put ice in a plastic bag.  Place a towel between your skin and the bag.  Leave the ice on for 15-20 minutes at a time, 3-4 times a day.  Keep your leg raised (elevated) when possible to lessen swelling.  Avoid activities that cause pain.  Follow specific exercises as directed by your health care provider.  Sleep with a pillow between your legs on your most comfortable side.  Record how often you have hip pain, the location of the pain, and what it feels like. SEEK MEDICAL CARE IF:   You are unable to put weight on your leg.  Your hip is red or swollen or very tender to touch.  Your pain or swelling continues or worsens after 1 week.  You have increasing difficulty walking.  You have a fever. SEEK IMMEDIATE MEDICAL CARE IF:   You have fallen.  You have a sudden increase in pain and swelling in your hip. MAKE SURE YOU:   Understand these instructions.  Will watch your condition.  Will get help right away if you are not doing well or get worse.   This information is not intended to replace advice given to you by your health care provider. Make sure you discuss any questions you have with your health care provider.   Document Released: 08/02/2009 Document Revised: 03/05/2014 Document Reviewed: 10/09/2012 Elsevier Interactive Patient Education [employer].

## 2015-01-17 ENCOUNTER — Ambulatory Visit: Payer: Self-pay | Admitting: Family Medicine

## 2015-01-18 DIAGNOSIS — F3189 Other bipolar disorder: Secondary | ICD-10-CM | POA: Diagnosis not present

## 2015-01-24 ENCOUNTER — Ambulatory Visit: Payer: Self-pay | Admitting: Family Medicine

## 2015-01-24 ENCOUNTER — Telehealth: Payer: Self-pay | Admitting: Family Medicine

## 2015-01-24 DIAGNOSIS — F3189 Other bipolar disorder: Secondary | ICD-10-CM | POA: Diagnosis not present

## 2015-01-24 NOTE — Telephone Encounter (Signed)
Patient had appointment for today but had to reschedule due to Dr not being in the office. She did reschedule for next week. Also, she is requesting a referral for [facility] (so they can help with cleaning her home) and also need referral to an Orthopedic.

## 2015-01-25 ENCOUNTER — Ambulatory Visit (INDEPENDENT_AMBULATORY_CARE_PROVIDER_SITE_OTHER): Payer: Commercial Managed Care - HMO | Admitting: Licensed Clinical Social Worker

## 2015-01-25 DIAGNOSIS — F3176 Bipolar disorder, in full remission, most recent episode depressed: Secondary | ICD-10-CM | POA: Diagnosis not present

## 2015-01-25 NOTE — Telephone Encounter (Signed)
Referred to ortho already on 01/13/2015. Will discuss need for PCS with [facility] at our follow up visit.
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
## 2015-01-26 ENCOUNTER — Encounter: Payer: Self-pay | Admitting: Emergency Medicine

## 2015-01-26 ENCOUNTER — Ambulatory Visit: Payer: Commercial Managed Care - HMO | Admitting: Anesthesiology

## 2015-01-26 ENCOUNTER — Emergency Department: Payer: Commercial Managed Care - HMO

## 2015-01-26 ENCOUNTER — Emergency Department
Admission: EM | Admit: 2015-01-26 | Discharge: 2015-01-26 | Disposition: A | Discharge status: Home / Self Care | Payer: Commercial Managed Care - HMO | Attending: Emergency Medicine | Admitting: Emergency Medicine

## 2015-01-26 DIAGNOSIS — M25552 Pain in left hip: Secondary | ICD-10-CM

## 2015-01-26 DIAGNOSIS — Y998 Other external cause status: Secondary | ICD-10-CM | POA: Diagnosis not present

## 2015-01-26 DIAGNOSIS — Y9289 Other specified places as the place of occurrence of the external cause: Secondary | ICD-10-CM | POA: Diagnosis not present

## 2015-01-26 DIAGNOSIS — G8929 Other chronic pain: Secondary | ICD-10-CM | POA: Insufficient documentation

## 2015-01-26 DIAGNOSIS — M25559 Pain in unspecified hip: Secondary | ICD-10-CM | POA: Diagnosis not present

## 2015-01-26 DIAGNOSIS — S79912A Unspecified injury of left hip, initial encounter: Secondary | ICD-10-CM | POA: Insufficient documentation

## 2015-01-26 DIAGNOSIS — F3189 Other bipolar disorder: Secondary | ICD-10-CM | POA: Diagnosis not present

## 2015-01-26 DIAGNOSIS — Y9389 Activity, other specified: Secondary | ICD-10-CM | POA: Diagnosis not present

## 2015-01-26 DIAGNOSIS — F17210 Nicotine dependence, cigarettes, uncomplicated: Secondary | ICD-10-CM | POA: Diagnosis not present

## 2015-01-26 DIAGNOSIS — W2209XA Striking against other stationary object, initial encounter: Secondary | ICD-10-CM | POA: Diagnosis not present

## 2015-01-26 MED ORDER — NAPROXEN 500 MG PO TABS: 500.0000 mg | ORAL_TABLET | Freq: Two times a day (BID) | ORAL | Status: DC

## 2015-01-26 NOTE — ED Notes (Signed)
States she is having left hip pain  Pain started several months ago. Denies new injury hx of hip replacement   Unable to bear wt.. But having increased pain

## 2015-01-26 NOTE — Discharge Instructions (Signed)
Chronic Pain  Chronic pain can be defined as pain that is off and on and lasts for 3-6 months or longer. Many things cause chronic pain, which can make it difficult to make a diagnosis. There are many treatment options available for chronic pain. However, finding a treatment that works well for you may require trying various approaches until the right one is found. Many people benefit from a combination of two or more types of treatment to control their pain.  SYMPTOMS   Chronic pain can occur anywhere in the body and can range from mild to very severe. Some types of chronic pain include:  · Headache.  · Low back pain.  · Cancer pain.  · Arthritis pain.  · Neurogenic pain. This is pain resulting from damage to nerves.   People with chronic pain may also have other symptoms such as:  · Depression.  · Anger.  · Insomnia.  · Anxiety.  DIAGNOSIS   Your health care provider will help diagnose your condition over time. In many cases, the initial focus will be on excluding possible conditions that could be causing the pain. Depending on your symptoms, your health care provider may order tests to diagnose your condition. Some of these tests may include:   · Blood tests.    · CT scan.    · MRI.    · X-rays.    · Ultrasounds.    · Nerve conduction studies.    You may need to see a specialist.   TREATMENT   Finding treatment that works well may take time. You may be referred to a pain specialist. He or she may prescribe medicine or therapies, such as:   · Mindful meditation or yoga.  · Shots (injections) of numbing or pain-relieving medicines into the spine or area of pain.  · Local electrical stimulation.  · Acupuncture.    · Massage therapy.    · Aroma, color, light, or sound therapy.    · Biofeedback.    · Working with a physical therapist to keep from getting stiff.    · Regular, gentle exercise.    · Cognitive or behavioral therapy.    · Group support.    Sometimes, surgery may be recommended.   HOME CARE INSTRUCTIONS    · Take all medicines as directed by your health care provider.    · Lessen stress in your life by relaxing and doing things such as listening to calming music.    · Exercise or be active as directed by your health care provider.    · Eat a healthy diet and include things such as vegetables, fruits, fish, and lean meats in your diet.    · Keep all follow-up appointments with your health care provider.    · Attend a support group with others suffering from chronic pain.  SEEK MEDICAL CARE IF:   · Your pain gets worse.    · You develop a new pain that was not there before.    · You cannot tolerate medicines given to you by your health care provider.    · You have new symptoms since your last visit with your health care provider.    SEEK IMMEDIATE MEDICAL CARE IF:   · You feel weak.    · You have decreased sensation or numbness.    · You lose control of bowel or bladder function.    · Your pain suddenly gets much worse.    · You develop shaking.  · You develop chills.  · You develop confusion.  · You develop chest pain.  · You develop shortness of breath.    MAKE SURE YOU:  ·   Document Revised: 10/15/2012 Document Reviewed: 08/08/2012 Elsevier Interactive Patient Education 2016 [place] will need to call and make an appointment with the orthopedist written on your papers tonight. Naproxen 500 mg 1 twice a day with food. Follow-up with your primary care doctor for your hip pain until you are seen by the orthopedist.

## 2015-01-26 NOTE — ED Notes (Signed)
Pt presents to ed via ems with reports of left hip pain since august. Pt was able to transfer from stretcher to wheelchair without any acute distress noted.
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
## 2015-01-27 ENCOUNTER — Emergency Department
Admission: EM | Admit: 2015-01-27 | Discharge: 2015-01-27 | Disposition: A | Discharge status: Home / Self Care | Payer: Commercial Managed Care - HMO | Attending: Emergency Medicine | Admitting: Emergency Medicine

## 2015-01-27 ENCOUNTER — Encounter: Payer: Self-pay | Admitting: General Practice

## 2015-01-27 DIAGNOSIS — M549 Dorsalgia, unspecified: Secondary | ICD-10-CM | POA: Diagnosis not present

## 2015-01-27 DIAGNOSIS — M545 Low back pain: Secondary | ICD-10-CM | POA: Diagnosis present

## 2015-01-27 DIAGNOSIS — Z79899 Other long term (current) drug therapy: Secondary | ICD-10-CM | POA: Diagnosis not present

## 2015-01-27 DIAGNOSIS — Z791 Long term (current) use of non-steroidal anti-inflammatories (NSAID): Secondary | ICD-10-CM | POA: Diagnosis not present

## 2015-01-27 DIAGNOSIS — G8929 Other chronic pain: Secondary | ICD-10-CM | POA: Insufficient documentation

## 2015-01-27 DIAGNOSIS — M5442 Lumbago with sciatica, left side: Secondary | ICD-10-CM | POA: Diagnosis not present

## 2015-01-27 DIAGNOSIS — M25552 Pain in left hip: Secondary | ICD-10-CM

## 2015-01-27 DIAGNOSIS — F17210 Nicotine dependence, cigarettes, uncomplicated: Secondary | ICD-10-CM | POA: Insufficient documentation

## 2015-01-27 DIAGNOSIS — F3189 Other bipolar disorder: Secondary | ICD-10-CM | POA: Diagnosis not present

## 2015-01-27 MED ORDER — OXYCODONE-ACETAMINOPHEN 5-325 MG PO TABS
1.0000 | ORAL_TABLET | Freq: Once | ORAL | Status: AC
  Administered 2015-01-27: 1 via ORAL
  Filled 2015-01-27: qty 1

## 2015-01-27 MED ORDER — OXYCODONE-ACETAMINOPHEN 5-325 MG PO TABS: 1.0000 | ORAL_TABLET | Freq: Four times a day (QID) | ORAL | Status: DC | PRN

## 2015-01-27 NOTE — Discharge Instructions (Signed)
Chronic Back Pain  When back pain lasts longer than 3 months, it is called chronic back pain.People with chronic back pain often go through certain periods that are more intense (flare-ups).  CAUSES Chronic back pain can be caused by wear and tear (degeneration) on different structures in your back. These structures include:  The bones of your spine (vertebrae) and the joints surrounding your spinal cord and nerve roots (facets).  The strong, fibrous tissues that connect your vertebrae (ligaments). Degeneration of these structures may result in pressure on your nerves. This can lead to constant pain. HOME CARE INSTRUCTIONS  Avoid bending, heavy lifting, prolonged sitting, and activities which make the problem worse.  Take brief periods of rest throughout the day to reduce your pain. Lying down or standing usually is better than sitting while you are resting.  Take over-the-counter or prescription medicines only as directed by your caregiver. SEEK IMMEDIATE MEDICAL CARE IF:   You have weakness or numbness in one of your legs or feet.  You have trouble controlling your bladder or bowels.  You have nausea, vomiting, abdominal pain, shortness of breath, or fainting.   This information is not intended to replace advice given to you by your health care provider. Make sure you discuss any questions you have with your health care provider.   Document Released: 03/22/2004 Document Revised: 05/07/2011 Document Reviewed: 08/02/2014 Elsevier Interactive Patient Education 2016 Elsevier Inc[employer]  Hip Pain Your hip is the joint between your upper legs and your lower pelvis. The bones, cartilage, tendons, and muscles of your hip joint perform a lot of work each day supporting your body weight and allowing you to move around. Hip pain can range from a minor ache to severe pain in one or both of your hips. Pain may be felt on the inside of the hip joint near the groin, or the outside near the buttocks and  upper thigh. You may have swelling or stiffness as well.  HOME CARE INSTRUCTIONS   Take medicines only as directed by your health care provider.  Apply ice to the injured area:  Put ice in a plastic bag.  Place a towel between your skin and the bag.  Leave the ice on for 15-20 minutes at a time, 3-4 times a day.  Keep your leg raised (elevated) when possible to lessen swelling.  Avoid activities that cause pain.  Follow specific exercises as directed by your health care provider.  Sleep with a pillow between your legs on your most comfortable side.  Record how often you have hip pain, the location of the pain, and what it feels like. SEEK MEDICAL CARE IF:   You are unable to put weight on your leg.  Your hip is red or swollen or very tender to touch.  Your pain or swelling continues or worsens after 1 week.  You have increasing difficulty walking.  You have a fever. SEEK IMMEDIATE MEDICAL CARE IF:   You have fallen.  You have a sudden increase in pain and swelling in your hip. MAKE SURE YOU:   Understand these instructions.  Will watch your condition.  Will get help right away if you are not doing well or get worse.   This information is not intended to replace advice given to you by your health care provider. Make sure you discuss any questions you have with your health care provider.   Document Released: 08/02/2009 Document Revised: 03/05/2014 Document Reviewed: 10/09/2012 Elsevier Interactive Patient Education [employer].  Kidney pain medicine as directed for pain control. Contact your primary physician for follow-up as well as the orthopedist. Return to the emergency room for any concerns.

## 2015-01-27 NOTE — ED Notes (Signed)
Ems pt via wheelchair to lobby , complaining of back pain, hx of same , VSS

## 2015-01-27 NOTE — ED Notes (Signed)
Pt to ED via EMS c/o L hip pain that radiates down L leg. Pt denies any specific injury. Pt was walking out of [employer] when the pain began.

## 2015-01-27 NOTE — ED Provider Notes (Signed)
[facility] Emergency Department Provider Note  ____________________________________________  Time seen: Approximately 7:53 PM  I have reviewed the triage vital signs and the nursing notes.   HISTORY  Chief Complaint Back Pain and Hip Pain    HPI Nicole Austin is a 54 y.o. female with history of chronic back pain and worsening left hip pain. Was seen yesterday here in the ER and x-ray revealed possible avascular necrosis. She contacted her primary physician today and plans to see her in 3 days. She's had persistent pain with standing in the left groin, left buttock and left lower back. She denies any abdominal pain, no fevers or chills   Past Medical History  Diagnosis Date  . COPD (chronic obstructive pulmonary disease) ([place])   . PTSD (post-traumatic stress disorder)   . Rhabdomyolysis   . Bipolar 1 disorder ([place])   . Arthritis   . Stroke ([place])   . MI (myocardial infarction) ([place])   . Depression   . ADHD (attention deficit hyperactivity disorder)   . Asthma     Patient Active Problem List   Diagnosis Date Noted  . Chest pain 12/31/2014  . Bipolar I disorder, most recent episode depressed ([place]) 12/22/2014  . Morbid obesity ([place]) 12/22/2014  . Rash and nonspecific skin eruption 11/16/2014  . Burn of finger 09/23/2014  . Anxiety 08/16/2014  . Affective bipolar disorder ([place]) 08/16/2014  . Gout 08/16/2014  . H/O gastrointestinal disease 08/16/2014  . Extreme obesity ([place]) 08/16/2014  . Muscle spasms of both lower extremities 08/16/2014  . Back pain, chronic 08/13/2014  . Low back pain with sciatica 08/04/2014  . Compulsive tobacco user syndrome 08/04/2014  . Current tobacco use 08/04/2014  . Polysubstance abuse 07/04/2014  . Anxiety, generalized 11/24/2013  . Imbalance 11/24/2013  . Blurred vision 11/24/2013  . Cephalalgia 11/24/2013  . COPD, moderate ([place]) 11/18/2013  . Moderate COPD (chronic obstructive pulmonary disease) ([place])  11/18/2013  . HPV (human papilloma virus) infection 07/17/2013  . Arthritis of knee, degenerative 07/15/2013  . H/O neoplasm 07/31/2011    Past Surgical History  Procedure Laterality Date  . Replacement total knee Left     Current Outpatient Rx  Name  Route  Sig  Dispense  Refill  . albuterol (PROVENTIL HFA;VENTOLIN HFA) 108 (90 BASE) MCG/ACT inhaler   Inhalation   Inhale 1-2 puffs into the lungs every 6 (six) hours as needed for wheezing or shortness of breath.   6.7 g   2   . ALPRAZolam (XANAX) 1 MG tablet   Oral   Take 1 tablet (1 mg total) by mouth 4 (four) times daily as needed for anxiety.   120 tablet   2   . cyclobenzaprine (FLEXERIL) 5 MG tablet   Oral   Take 2 tablets (10 mg total) by mouth 3 (three) times daily as needed for muscle spasms.   90 tablet   5   . divalproex (DEPAKOTE) 250 MG DR tablet   Oral   Take 1 tablet (250 mg total) by mouth 2 (two) times daily.   60 tablet   1   . escitalopram (LEXAPRO) 20 MG tablet   Oral   Take 1 tablet (20 mg total) by mouth daily.   30 tablet   2   . lidocaine (XYLOCAINE) 5 % ointment   Topical   Apply 1 application topically as needed for mild pain.         Marland Kitchen LYRICA 200 MG capsule   Oral  Take 1 capsule (200 mg total) by mouth 2 (two) times daily.   60 capsule   5     Dispense as written.   . Misc Natural Products (OSTEO BI-FLEX ADV TRIPLE ST) TABS   Oral   Take 1 tablet by mouth 2 (two) times daily.         . Multiple Vitamin (MULTIVITAMIN WITH MINERALS) TABS tablet   Oral   Take 1 tablet by mouth daily.         . naproxen (NAPROSYN) 500 MG tablet   Oral   Take 1 tablet (500 mg total) by mouth 2 (two) times daily with a meal.   30 tablet   0   . oxyCODONE-acetaminophen (ROXICET) 5-325 MG tablet   Oral   Take 1 tablet by mouth every 6 (six) hours as needed.   20 tablet   0   . QUEtiapine (SEROQUEL XR) 300 MG 24 hr tablet   Oral   Take 1 tablet (300 mg total) by mouth every  evening.   30 tablet   2   . traMADol (ULTRAM) 50 MG tablet   Oral   Take 1 tablet (50 mg total) by mouth 3 (three) times daily after meals.   42 tablet   0   . traZODone (DESYREL) 100 MG tablet   Oral   Take 1 tablet (100 mg total) by mouth 2 (two) times daily.   60 tablet   1     Allergies Codeine  Family History  Problem Relation Age of Onset  . Hernia Mother   . Heart disease Mother   . OCD Mother   . Diabetes Mother   . Parkinson's disease Father   . Bipolar disorder Sister   . Schizophrenia Sister   . ADD / ADHD Sister   . Alcohol abuse Brother   . Bipolar disorder Sister   . Paranoid behavior Sister   . ADD / ADHD Sister     Social History Social History  Substance Use Topics  . Smoking status: Current Every Day Smoker -- 0.50 packs/day    Types: Cigarettes    Start date: 10/11/1984  . Smokeless tobacco: Never Used  . Alcohol Use: No    Review of Systems Constitutional: No fever/chills Eyes: No visual changes. ENT: No sore throat. Cardiovascular: Denies chest pain. Respiratory: Denies shortness of breath. Gastrointestinal: No abdominal pain.  No nausea, no vomiting.  No diarrhea.  No constipation. Genitourinary: Negative for dysuria. Musculoskeletal: per HPI. Skin: Negative for rash. Neurological: Negative for headaches, focal weakness or numbness. 10-point ROS otherwise negative.  ____________________________________________   PHYSICAL EXAM:  VITAL SIGNS: ED Triage Vitals  Enc Vitals Group     BP 01/27/15 1917 121/75 mmHg     Pulse Rate 01/27/15 1917 86     Resp 01/27/15 1917 18     Temp 01/27/15 1917 98.4 F (36.9 C)     Temp Source 01/27/15 1917 Oral     SpO2 01/27/15 1917 98 %     Weight 01/27/15 1917 255 lb (115.667 kg)     Height 01/27/15 1917 5\' 1"  (1.549 m)     Head Cir --      Peak Flow --      Pain Score 01/27/15 1919 9     Pain Loc --      Pain Edu? --      Excl. in [place]? --     Constitutional: Alert and oriented.  Well appearing and in no  acute distress. Eyes: Conjunctivae are normal. PERRL. EOMI. Ears:  Clear with normal landmarks. No erythema. Head: Atraumatic. Nose: No congestion/rhinnorhea. Mouth/Throat: Mucous membranes are moist.  Oropharynx non-erythematous. No lesions. Neck:  Supple.  No adenopathy.   Cardiovascular: Normal rate, regular rhythm. Grossly normal heart sounds.  Good peripheral circulation. Respiratory: Normal respiratory effort.  No retractions. Lungs CTAB. Gastrointestinal: Soft and nontender. No distention. No abdominal bruits. No CVA tenderness. Musculoskeletal: Tender over left lower back, left buttock, left trochanteric bursa, and left groin. Pain with internal and external rotation of the hip joint. Neurologic:  Normal speech and language. No gross focal neurologic deficits are appreciated. No gait instability. Skin:  Skin is warm, dry and intact. No rash noted. Psychiatric: Mood and affect are normal. Speech and behavior are normal.  ____________________________________________   LABS (all labs ordered are listed, but only abnormal results are displayed)  Labs Reviewed - No data to display ____________________________________________  EKG   ____________________________________________  RADIOLOGY  CLINICAL DATA: Left hip pain. Unable to bear weight.  EXAM: DG HIP (WITH OR WITHOUT PELVIS) 2-3V LEFT  COMPARISON: None.  FINDINGS: There is no evidence of hip fracture or dislocation. There is no evidence of arthropathy. There is subtle subchondral sclerosis in bilateral femoral heads as can be seen with avascular necrosis.  IMPRESSION: 1. No acute osseous injury of the left hip. 2. Subchondral sclerosis in bilateral femoral heads as can be seen with avascular necrosis. If there is further clinical concern, recommend an MRI of the hips.   Electronically Signed  By: Kathreen Devoid  On: 01/26/2015  19:02  ____________________________________________   PROCEDURES  Procedure(s) performed: None  Critical Care performed: No  ____________________________________________   INITIAL IMPRESSION / ASSESSMENT AND PLAN / ED COURSE  Pertinent labs & imaging results that were available during my care of the patient were reviewed by me and considered in my medical decision making (see chart for details).  54 year old female with acute on chronic low back pain, and left hip pain. She was seen yesterday in the emergency room and x-rays revealed possible avascular necrosis. She spoke with her primary physician today and has an appointment on December 5. She anticipates seeing orthopedist Dr. Rudene Christians next week. Pain is worse with standing. She currently takes tramadol, Xanax and Lyrica. In the emergency room she is given Percocet 1 with additional prescription for 20 pills. ____________________________________________   FINAL CLINICAL IMPRESSION(S) / ED DIAGNOSES  Final diagnoses:  Hip pain, acute, left  Left-sided low back pain with left-sided sciatica      Mortimer Fries, PA-C 01/27/15 1957  Ahmed Prima, MD 01/27/15 [phone]

## 2015-01-28 DIAGNOSIS — F3189 Other bipolar disorder: Secondary | ICD-10-CM | POA: Diagnosis not present

## 2015-01-31 ENCOUNTER — Ambulatory Visit (INDEPENDENT_AMBULATORY_CARE_PROVIDER_SITE_OTHER): Payer: Commercial Managed Care - HMO | Admitting: Family Medicine

## 2015-01-31 ENCOUNTER — Ambulatory Visit: Payer: Self-pay | Admitting: Family Medicine

## 2015-01-31 ENCOUNTER — Encounter: Payer: Self-pay | Admitting: Family Medicine

## 2015-01-31 VITALS — BP 124/78 | HR 94 | Temp 98.9°F | Resp 18 | Wt 263.0 lb

## 2015-01-31 DIAGNOSIS — F3189 Other bipolar disorder: Secondary | ICD-10-CM | POA: Diagnosis not present

## 2015-01-31 DIAGNOSIS — M25552 Pain in left hip: Secondary | ICD-10-CM

## 2015-01-31 DIAGNOSIS — F3130 Bipolar disorder, current episode depressed, mild or moderate severity, unspecified: Secondary | ICD-10-CM

## 2015-01-31 NOTE — Progress Notes (Signed)
Name: Nicole Austin   MRN: [id number]    DOB: [birth year]   Date:01/31/2015       Progress Note  Subjective  Chief Complaint  Chief Complaint  Patient presents with  . Depression    patient stated that she is still not over her mother's death.    HPI   Nicole Austin is here today for routine follow up. Patient reports that her mood is very sad, down and tearful since the death of her mother in 10-14-14. She states that she does have several social stressors as usual. Her sisters are dividing up her late mother's belongings and they will not give Nicole Austin what she wants, mostly pictures. She has followed up with her psychiatrist on 01/13/15, medications adjusted. At the time she was offered grief and bereavement counseling which she declined according to his notes but today patient states she changed her mind and has started grief counseling. Has another meeting coming up next week.  Nicole Austin has frequented the ER with complaints of pain recently. She denies current suicidal thoughts or plans. She continues to complain of pain in multiple joints and lower extremity spasms. She is followed by Nicole Austin for long history of multi-level spinal pain, sciatica, fibromyalgia all complicated by her frequently fluctuating mood disorder.  More recently she has been having worsening left hip pain. Was seen in the local ER and x-ray revealed possible avascular necrosis without evidence of fracture. She's had persistent pain with standing in the left groin, left buttock and left lower back. She denies any abdominal pain, no fevers or chills.   Other providers: psychiatry specialist Dr. Faith Austin pain management specialist Dr. Lance Austin cardiologist Dr. Ida Austin    Past Medical History  Diagnosis Date  . COPD (chronic obstructive pulmonary disease) ([place])   . PTSD (post-traumatic stress disorder)   . Rhabdomyolysis   . Bipolar 1 disorder ([place])   . Arthritis   . Stroke  ([place])   . MI (myocardial infarction) ([place])   . Depression   . ADHD (attention deficit hyperactivity disorder)   . Asthma     Patient Active Problem List   Diagnosis Date Noted  . Chest pain 12/31/2014  . Bipolar I disorder, most recent episode depressed ([place]) 12/22/2014  . Morbid obesity ([place]) 12/22/2014  . Rash and nonspecific skin eruption 11/16/2014  . Burn of finger 09/23/2014  . Anxiety 08/16/2014  . Affective bipolar disorder ([place]) 08/16/2014  . Gout 08/16/2014  . H/O gastrointestinal disease 08/16/2014  . Extreme obesity ([place]) 08/16/2014  . Muscle spasms of both lower extremities 08/16/2014  . Back pain, chronic 08/13/2014  . Low back pain with sciatica 08/04/2014  . Compulsive tobacco user syndrome 08/04/2014  . Current tobacco use 08/04/2014  . Polysubstance abuse 07/04/2014  . Anxiety, generalized 11/24/2013  . Imbalance 11/24/2013  . Blurred vision 11/24/2013  . Cephalalgia 11/24/2013  . COPD, moderate ([place]) 11/18/2013  . Moderate COPD (chronic obstructive pulmonary disease) ([place]) 11/18/2013  . HPV (human papilloma virus) infection 07/17/2013  . Arthritis of knee, degenerative 07/15/2013  . H/O neoplasm 07/31/2011    Social History  Substance Use Topics  . Smoking status: Current Every Day Smoker -- 0.50 packs/day    Types: Cigarettes    Start date: 10/11/1984  . Smokeless tobacco: Never Used  . Alcohol Use: No     Current outpatient prescriptions:  .  albuterol (PROVENTIL HFA;VENTOLIN HFA) 108 (90 BASE) MCG/ACT inhaler, Inhale 1-2 puffs into the lungs every  6 (six) hours as needed for wheezing or shortness of breath., Disp: 6.7 g, Rfl: 2 .  ALPRAZolam (XANAX) 1 MG tablet, Take 1 tablet (1 mg total) by mouth 4 (four) times daily as needed for anxiety., Disp: 120 tablet, Rfl: 2 .  cyclobenzaprine (FLEXERIL) 5 MG tablet, Take 2 tablets (10 mg total) by mouth 3 (three) times daily as needed for muscle spasms., Disp: 90 tablet, Rfl: 5 .  divalproex (DEPAKOTE)  250 MG DR tablet, Take 1 tablet (250 mg total) by mouth 2 (two) times daily., Disp: 60 tablet, Rfl: 1 .  escitalopram (LEXAPRO) 20 MG tablet, Take 1 tablet (20 mg total) by mouth daily., Disp: 30 tablet, Rfl: 2 .  lidocaine (XYLOCAINE) 5 % ointment, Apply 1 application topically as needed for mild pain., Disp: , Rfl:  .  LYRICA 200 MG capsule, Take 1 capsule (200 mg total) by mouth 2 (two) times daily., Disp: 60 capsule, Rfl: 5 .  Misc Natural Products (OSTEO BI-FLEX ADV TRIPLE ST) TABS, Take 1 tablet by mouth 2 (two) times daily., Disp: , Rfl:  .  Multiple Vitamin (MULTIVITAMIN WITH MINERALS) TABS tablet, Take 1 tablet by mouth daily., Disp: , Rfl:  .  naproxen (NAPROSYN) 500 MG tablet, Take 1 tablet (500 mg total) by mouth 2 (two) times daily with a meal., Disp: 30 tablet, Rfl: 0 .  oxyCODONE-acetaminophen (ROXICET) 5-325 MG tablet, Take 1 tablet by mouth every 6 (six) hours as needed., Disp: 20 tablet, Rfl: 0 .  QUEtiapine (SEROQUEL XR) 300 MG 24 hr tablet, Take 1 tablet (300 mg total) by mouth every evening., Disp: 30 tablet, Rfl: 2 .  traMADol (ULTRAM) 50 MG tablet, Take 1 tablet (50 mg total) by mouth 3 (three) times daily after meals., Disp: 42 tablet, Rfl: 0 .  traZODone (DESYREL) 100 MG tablet, Take 1 tablet (100 mg total) by mouth 2 (two) times daily., Disp: 60 tablet, Rfl: 1  Past Surgical History  Procedure Laterality Date  . Replacement total knee Left     Family History  Problem Relation Age of Onset  . Hernia Mother   . Heart disease Mother   . OCD Mother   . Diabetes Mother   . Parkinson's disease Father   . Bipolar disorder Sister   . Schizophrenia Sister   . ADD / ADHD Sister   . Alcohol abuse Brother   . Bipolar disorder Sister   . Paranoid behavior Sister   . ADD / ADHD Sister     Allergies  Allergen Reactions  . Codeine Itching     Review of Systems  CONSTITUTIONAL: No significant weight changes, fever, chills, weakness or fatigue.   CARDIOVASCULAR: No  chest pain, chest pressure or chest discomfort. No palpitations or edema.  RESPIRATORY: No shortness of breath, cough or sputum.  GASTROINTESTINAL: No anorexia, nausea, vomiting. No changes in bowel habits. No abdominal pain or blood.  NEUROLOGICAL: No headache, dizziness, syncope, paralysis, ataxia, numbness or tingling in the extremities. No memory changes. No change in bowel or bladder control.  MUSCULOSKELETAL: Yes joint pain. No muscle pain. PSYCHIATRIC: Fluctuations in mood. No change in sleep pattern.    Objective  BP 124/78 mmHg  Pulse 94  Temp(Src) 98.9 F (37.2 C) (Oral)  Resp 18  Wt 263 lb (119.296 kg)  SpO2 93%  LMP  (LMP Unknown) Body mass index is 49.72 kg/(m^2).  Physical Exam  Constitutional: Patient is obese and well-nourished. In no distress. In wheel chair today but able to get  up and ambulate with no assistance today. Cardiovascular: Normal rate, regular rhythm and normal heart sounds.  No murmur heard.  Pulmonary/Chest: Effort normal and breath sounds normal. No respiratory distress. Musculoskeletal: Normal range of motion bilateral UE and LE, no joint effusions. Peripheral vascular: Bilateral LE no edema. Neurological: CN II-XII grossly intact with no focal deficits. Alert and oriented to person, place, and time.  Skin: Skin is warm and dry. No rash noted. No erythema.  Psychiatric: Patient has sad but stable mood and affect. Behavior is normal in office today.   Assessment & Plan  1. Bipolar I disorder, most recent episode depressed ([place]) Encouraged [place] to continue grief counseling. If thoughts of suicide occur proceed to ER immediately. Always welcome to reach out to me if she needs anything. She was pleased with our attention to her and thanked me.   2. Left hip pain On going symptoms are stable. She did not request further pain management from me today. I could not find an appointment with Dr. Rudene Christians or [place] so I placed an official referral  as I am concerned about possible avascular necrosis of left hip. Advised patient to continue using cane or walker, avoid falls.   - Ambulatory referral to Orthopedic Surgery

## 2015-02-01 DIAGNOSIS — F3189 Other bipolar disorder: Secondary | ICD-10-CM | POA: Diagnosis not present

## 2015-02-02 DIAGNOSIS — F3189 Other bipolar disorder: Secondary | ICD-10-CM | POA: Diagnosis not present

## 2015-02-03 ENCOUNTER — Other Ambulatory Visit: Payer: Self-pay

## 2015-02-03 DIAGNOSIS — F3189 Other bipolar disorder: Secondary | ICD-10-CM | POA: Diagnosis not present

## 2015-02-03 MED ORDER — DIVALPROEX SODIUM 250 MG PO DR TAB: 250.0000 mg | DELAYED_RELEASE_TABLET | Freq: Two times a day (BID) | ORAL | Status: DC

## 2015-02-03 NOTE — Telephone Encounter (Signed)
received fax for refill on divalproex sodium dr 250mg . pt last seen on 01-13-15 next appt 04-05-15.

## 2015-02-04 DIAGNOSIS — F3189 Other bipolar disorder: Secondary | ICD-10-CM | POA: Diagnosis not present

## 2015-02-07 ENCOUNTER — Ambulatory Visit: Payer: Commercial Managed Care - HMO | Admitting: Licensed Clinical Social Worker

## 2015-02-07 DIAGNOSIS — F3189 Other bipolar disorder: Secondary | ICD-10-CM | POA: Diagnosis not present

## 2015-02-08 ENCOUNTER — Encounter: Admission: RE | Payer: Self-pay | Source: Ambulatory Visit

## 2015-02-08 ENCOUNTER — Ambulatory Visit
Admission: RE | Admit: 2015-02-08 | Payer: Commercial Managed Care - HMO | Source: Ambulatory Visit | Admitting: Gastroenterology

## 2015-02-08 ENCOUNTER — Other Ambulatory Visit: Payer: Self-pay

## 2015-02-08 DIAGNOSIS — F3189 Other bipolar disorder: Secondary | ICD-10-CM | POA: Diagnosis not present

## 2015-02-08 DIAGNOSIS — J449 Chronic obstructive pulmonary disease, unspecified: Secondary | ICD-10-CM

## 2015-02-08 SURGERY — COLONOSCOPY WITH PROPOFOL
Anesthesia: General

## 2015-02-08 MED ORDER — ALBUTEROL SULFATE HFA 108 (90 BASE) MCG/ACT IN AERS: 1.0000 | INHALATION_SPRAY | Freq: Four times a day (QID) | RESPIRATORY_TRACT | Status: DC | PRN

## 2015-02-08 NOTE — Telephone Encounter (Signed)
Got a fax from [place] requesting a refill of this patient's Prednisone 10mg  and Ventolin inhaler.  Refill request was sent to Dr. Bobetta Lime for approval and submission.

## 2015-02-09 DIAGNOSIS — F3189 Other bipolar disorder: Secondary | ICD-10-CM | POA: Diagnosis not present

## 2015-02-10 DIAGNOSIS — F3189 Other bipolar disorder: Secondary | ICD-10-CM | POA: Diagnosis not present

## 2015-02-11 ENCOUNTER — Ambulatory Visit: Payer: Commercial Managed Care - HMO | Admitting: Licensed Clinical Social Worker

## 2015-02-13 ENCOUNTER — Encounter: Payer: Self-pay | Admitting: Emergency Medicine

## 2015-02-13 ENCOUNTER — Emergency Department
Admission: EM | Admit: 2015-02-13 | Discharge: 2015-02-13 | Disposition: A | Discharge status: Home / Self Care | Payer: Commercial Managed Care - HMO | Attending: Emergency Medicine | Admitting: Emergency Medicine

## 2015-02-13 DIAGNOSIS — M1612 Unilateral primary osteoarthritis, left hip: Secondary | ICD-10-CM | POA: Diagnosis not present

## 2015-02-13 DIAGNOSIS — Y9389 Activity, other specified: Secondary | ICD-10-CM | POA: Insufficient documentation

## 2015-02-13 DIAGNOSIS — F17210 Nicotine dependence, cigarettes, uncomplicated: Secondary | ICD-10-CM | POA: Insufficient documentation

## 2015-02-13 DIAGNOSIS — Z79899 Other long term (current) drug therapy: Secondary | ICD-10-CM | POA: Insufficient documentation

## 2015-02-13 DIAGNOSIS — Z791 Long term (current) use of non-steroidal anti-inflammatories (NSAID): Secondary | ICD-10-CM | POA: Diagnosis not present

## 2015-02-13 DIAGNOSIS — M7062 Trochanteric bursitis, left hip: Secondary | ICD-10-CM

## 2015-02-13 DIAGNOSIS — M25559 Pain in unspecified hip: Secondary | ICD-10-CM | POA: Diagnosis not present

## 2015-02-13 DIAGNOSIS — M25552 Pain in left hip: Secondary | ICD-10-CM | POA: Diagnosis present

## 2015-02-13 DIAGNOSIS — G8929 Other chronic pain: Secondary | ICD-10-CM | POA: Insufficient documentation

## 2015-02-13 MED ORDER — MELOXICAM 15 MG PO TABS: 15.0000 mg | ORAL_TABLET | Freq: Every day | ORAL | Status: DC

## 2015-02-13 MED ORDER — KETOROLAC TROMETHAMINE 60 MG/2ML IM SOLN
60.0000 mg | Freq: Once | INTRAMUSCULAR | Status: AC
  Administered 2015-02-13: 60 mg via INTRAMUSCULAR
  Filled 2015-02-13: qty 2

## 2015-02-13 NOTE — ED Notes (Signed)
Patient presents to the ED with left hip pain that she has had for several weeks.  Patient states she has been seen in the ED 3-4 times and has been referred to the orthopedist but they have not called the patient back.  Patient states she went to [employer] today and moving increased her pain.

## 2015-02-13 NOTE — Discharge Instructions (Signed)
Bursitis °Bursitis is inflammation and irritation of a bursa, which is one of the small, fluid-filled sacs that cushion and protect the moving parts of your body. These sacs are located between bones and muscles, muscle attachments, or skin areas next to bones. A bursa protects these structures from the wear and tear that results from frequent movement. °An inflamed bursa causes pain and swelling. Fluid may build up inside the sac. Bursitis is most common near joints, especially the knees, elbows, hips, and shoulders. °CAUSES °Bursitis can be caused by:  °· Injury from: °· A direct blow, like falling on your knee or elbow. °· Overuse of a joint (repetitive stress). °· Infection. This can happen if bacteria gets into a bursa through a cut or scrape near a joint. °· Diseases that cause joint inflammation, such as gout and rheumatoid arthritis. °RISK FACTORS °You may be at risk for bursitis if you:  °· Have a job or hobby that involves a lot of repetitive stress on your joints. °· Have a condition that weakens your body's defense system (immune system), such as diabetes, cancer, or HIV. °· Lift and reach overhead often. °· Kneel or lean on hard surfaces often. °· Run or walk often. °SIGNS AND SYMPTOMS °The most common signs and symptoms of bursitis are: °· Pain that gets worse when you move the affected body part or put weight on it. °· Inflammation. °· Stiffness. °Other signs and symptoms may include: °· Redness. °· Tenderness. °· Warmth. °· Pain that continues after rest. °· Fever and chills. This may occur in bursitis caused by infection. °DIAGNOSIS °Bursitis may be diagnosed by:  °· Medical history and physical exam. °· MRI. °· A procedure to drain fluid from the bursa with a needle (aspiration). The fluid may be checked for signs of infection or gout. °· Blood tests to rule out other causes of inflammation. °TREATMENT  °Bursitis can usually be treated at home with rest, ice, compression, and elevation (RICE). For  mild bursitis, RICE treatment may be all you need. Other treatments may include: °· Nonsteroidal anti-inflammatory drugs (NSAIDs) to treat pain and inflammation. °· Corticosteroids to fight inflammation. You may have these drugs injected into and around the area of bursitis. °· Aspiration of bursitis fluid to relieve pain and improve movement. °· Antibiotic medicine to treat an infected bursa. °· A splint, brace, or walking aid. °· Physical therapy if you continue to have pain or limited movement. °· Surgery to remove a damaged or infected bursa. This may be needed if you have a very bad case of bursitis or if other treatments have not worked. °HOME CARE INSTRUCTIONS  °· Take medicines only as directed by your health care provider. °· If you were prescribed an antibiotic medicine, finish it all even if you start to feel better. °· Rest the affected area as directed by your health care provider. °· Keep the area elevated. °· Avoid activities that make pain worse. °· Apply ice to the injured area: °· Place ice in a plastic bag. °· Place a towel between your skin and the bag. °· Leave the ice on for 20 minutes, 2-3 times a day. °· Use splints, braces, pads, or walking aids as directed by your health care provider. °· Keep all follow-up visits as directed by your health care provider. This is important. °PREVENTION  °· Wear knee pads if you kneel often. °· Wear sturdy running or walking shoes that fit you well. °· Take regular breaks from repetitive activity. °· Warm   up by stretching before doing any strenuous activity. °· Maintain a healthy weight or lose weight as recommended by your health care provider. Ask your health care provider if you need help. °· Exercise regularly. Start any new physical activity gradually. °SEEK MEDICAL CARE IF:  °· Your bursitis is not responding to treatment or home care. °· You have a fever. °· You have chills. °  °This information is not intended to replace advice given to you by your  health care provider. Make sure you discuss any questions you have with your health care provider. °  °Document Released: 02/10/2000 Document Revised: 11/03/2014 Document Reviewed: 05/04/2013 °Elsevier Interactive Patient Education ©2016 [employer] ° °Osteoarthritis °Osteoarthritis is a disease that causes soreness and inflammation of a joint. It occurs when the cartilage at the affected joint wears down. Cartilage acts as a cushion, covering the ends of bones where they meet to form a joint. Osteoarthritis is the most common form of arthritis. It often occurs in older people. The joints affected most often by this condition include those in the: °· Ends of the fingers. °· Thumbs. °· Neck. °· Lower back. °· Knees. °· Hips. °CAUSES  °Over time, the cartilage that covers the ends of bones begins to wear away. This causes bone to rub on bone, producing pain and stiffness in the affected joints.  °RISK FACTORS °Certain factors can increase your chances of having osteoarthritis, including: °· Older age. °· Excessive body weight. °· Overuse of joints. °· Previous joint injury. °SIGNS AND SYMPTOMS  °· Pain, swelling, and stiffness in the joint. °· Over time, the joint may lose its normal shape. °· Small deposits of bone (osteophytes) may grow on the edges of the joint. °· Bits of bone or cartilage can break off and float inside the joint space. This may cause more pain and damage. °DIAGNOSIS  °Your health care provider will do a physical exam and ask about your symptoms. Various tests may be ordered, such as: °· X-rays of the affected joint. °· Blood tests to rule out other types of arthritis. °Additional tests may be used to diagnose your condition. °TREATMENT  °Goals of treatment are to control pain and improve joint function. Treatment plans may include: °· A prescribed exercise program that allows for rest and joint relief. °· A weight control plan. °· Pain relief techniques, such as: °¨ Properly applied heat and  cold. °¨ Electric pulses delivered to nerve endings under the skin (transcutaneous electrical nerve stimulation [TENS]). °¨ Massage. °¨ Certain nutritional supplements. °· Medicines to control pain, such as: °¨ Acetaminophen. °¨ Nonsteroidal anti-inflammatory drugs (NSAIDs), such as naproxen. °¨ Narcotic or central-acting agents, such as tramadol. °¨ Corticosteroids. These can be given orally or as an injection. °· Surgery to reposition the bones and relieve pain (osteotomy) or to remove loose pieces of bone and cartilage. Joint replacement may be needed in advanced states of osteoarthritis. °HOME CARE INSTRUCTIONS  °· Take medicines only as directed by your health care provider. °· Maintain a healthy weight. Follow your health care provider's instructions for weight control. This may include dietary instructions. °· Exercise as directed. Your health care provider can recommend specific types of exercise. These may include: °¨ Strengthening exercises. These are done to strengthen the muscles that support joints affected by arthritis. They can be performed with weights or with exercise bands to add resistance. °¨ Aerobic activities. These are exercises, such as brisk walking or low-impact aerobics, that get your heart pumping. °¨ Range-of-motion activities. These   keep your joints limber. °¨ Balance and agility exercises. These help you maintain daily living skills. °· Rest your affected joints as directed by your health care provider. °· Keep all follow-up visits as directed by your health care provider. °SEEK MEDICAL CARE IF:  °· Your skin turns red. °· You develop a rash in addition to your joint pain. °· You have worsening joint pain. °· You have a fever along with joint or muscle aches. °SEEK IMMEDIATE MEDICAL CARE IF: °· You have a significant loss of weight or appetite. °· You have night sweats. °FOR MORE INFORMATION  °· National Institute of Arthritis and Musculoskeletal and Skin Diseases:  www.niams.nih.gov °· National Institute on Aging: www.nia.nih.gov °· American College of Rheumatology: www.rheumatology.org °  °This information is not intended to replace advice given to you by your health care provider. Make sure you discuss any questions you have with your health care provider. °  °Document Released: 02/12/2005 Document Revised: 03/05/2014 Document Reviewed: 10/20/2012 °Elsevier Interactive Patient Education ©2016 Elsevier Inc. ° °

## 2015-02-13 NOTE — ED Notes (Signed)
Patient medicated for pain. Has called for transport to home. Will D/C after med hold.

## 2015-02-13 NOTE — ED Provider Notes (Signed)
[facility] Emergency Department Provider Note  ____________________________________________  Time seen: Approximately 7:01 PM  I have reviewed the triage vital signs and the nursing notes.   HISTORY  Chief Complaint Hip Pain    HPI Nicole Austin is a 54 y.o. female who presents emergency department complaining of left hip pain. Patient has been seen and evaluated for this chronic pain multiple times in the emergency department. Patient states that she was out doing more activity than normal yesterday and now is having an increase in her pain. Patient states that she has known arthritic and degenerative changes and hip. Patient is on a pain management contract at this time. Patient has been instructed in the past to follow-up with orthopedics by she states that she was waiting for orthopedics to call her. Pain is sharp, constant, unrelieved by chronic pain medicine.   Past Medical History  Diagnosis Date  . COPD (chronic obstructive pulmonary disease) ([place])   . PTSD (post-traumatic stress disorder)   . Rhabdomyolysis   . Bipolar 1 disorder ([place])   . Arthritis   . Stroke ([place])   . MI (myocardial infarction) ([place])   . Depression   . ADHD (attention deficit hyperactivity disorder)   . Asthma     Patient Active Problem List   Diagnosis Date Noted  . Left hip pain 01/31/2015  . Chest pain 12/31/2014  . Bipolar I disorder, most recent episode depressed ([place]) 12/22/2014  . Morbid obesity ([place]) 12/22/2014  . Rash and nonspecific skin eruption 11/16/2014  . Burn of finger 09/23/2014  . Anxiety 08/16/2014  . Affective bipolar disorder ([place]) 08/16/2014  . Gout 08/16/2014  . H/O gastrointestinal disease 08/16/2014  . Extreme obesity ([place]) 08/16/2014  . Muscle spasms of both lower extremities 08/16/2014  . Back pain, chronic 08/13/2014  . Low back pain with sciatica 08/04/2014  . Compulsive tobacco user syndrome 08/04/2014  . Current tobacco use  08/04/2014  . Polysubstance abuse 07/04/2014  . Anxiety, generalized 11/24/2013  . Imbalance 11/24/2013  . Blurred vision 11/24/2013  . Cephalalgia 11/24/2013  . COPD, moderate ([place]) 11/18/2013  . Moderate COPD (chronic obstructive pulmonary disease) ([place]) 11/18/2013  . HPV (human papilloma virus) infection 07/17/2013  . Arthritis of knee, degenerative 07/15/2013  . H/O neoplasm 07/31/2011    Past Surgical History  Procedure Laterality Date  . Replacement total knee Left     Current Outpatient Rx  Name  Route  Sig  Dispense  Refill  . albuterol (PROVENTIL HFA;VENTOLIN HFA) 108 (90 BASE) MCG/ACT inhaler   Inhalation   Inhale 1-2 puffs into the lungs every 6 (six) hours as needed for wheezing or shortness of breath.   6.7 g   1   . ALPRAZolam (XANAX) 1 MG tablet   Oral   Take 1 tablet (1 mg total) by mouth 4 (four) times daily as needed for anxiety.   120 tablet   2   . cyclobenzaprine (FLEXERIL) 5 MG tablet   Oral   Take 2 tablets (10 mg total) by mouth 3 (three) times daily as needed for muscle spasms.   90 tablet   5   . divalproex (DEPAKOTE) 250 MG DR tablet   Oral   Take 1 tablet (250 mg total) by mouth 2 (two) times daily.   60 tablet   1   . escitalopram (LEXAPRO) 20 MG tablet   Oral   Take 1 tablet (20 mg total) by mouth daily.   30 tablet  2   . lidocaine (XYLOCAINE) 5 % ointment   Topical   Apply 1 application topically as needed for mild pain.         Marland Kitchen LYRICA 200 MG capsule   Oral   Take 1 capsule (200 mg total) by mouth 2 (two) times daily.   60 capsule   5     Dispense as written.   . meloxicam (MOBIC) 15 MG tablet   Oral   Take 1 tablet (15 mg total) by mouth daily.   30 tablet   0   . Misc Natural Products (OSTEO BI-FLEX ADV TRIPLE ST) TABS   Oral   Take 1 tablet by mouth 2 (two) times daily.         . Multiple Vitamin (MULTIVITAMIN WITH MINERALS) TABS tablet   Oral   Take 1 tablet by mouth daily.         . naproxen  (NAPROSYN) 500 MG tablet   Oral   Take 1 tablet (500 mg total) by mouth 2 (two) times daily with a meal.   30 tablet   0   . oxyCODONE-acetaminophen (ROXICET) 5-325 MG tablet   Oral   Take 1 tablet by mouth every 6 (six) hours as needed.   20 tablet   0   . QUEtiapine (SEROQUEL XR) 300 MG 24 hr tablet   Oral   Take 1 tablet (300 mg total) by mouth every evening.   30 tablet   2   . traMADol (ULTRAM) 50 MG tablet   Oral   Take 1 tablet (50 mg total) by mouth 3 (three) times daily after meals.   42 tablet   0   . traZODone (DESYREL) 100 MG tablet   Oral   Take 1 tablet (100 mg total) by mouth 2 (two) times daily.   60 tablet   1     Allergies Codeine  Family History  Problem Relation Age of Onset  . Hernia Mother   . Heart disease Mother   . OCD Mother   . Diabetes Mother   . Parkinson's disease Father   . Bipolar disorder Sister   . Schizophrenia Sister   . ADD / ADHD Sister   . Alcohol abuse Brother   . Bipolar disorder Sister   . Paranoid behavior Sister   . ADD / ADHD Sister     Social History Social History  Substance Use Topics  . Smoking status: Current Every Day Smoker -- 0.50 packs/day    Types: Cigarettes    Start date: 10/11/1984  . Smokeless tobacco: Never Used  . Alcohol Use: No    Review of Systems Constitutional: No fever/chills Eyes: No visual changes. ENT: No sore throat. Cardiovascular: Denies chest pain. Respiratory: Denies shortness of breath. Gastrointestinal: No abdominal pain.  No nausea, no vomiting.  No diarrhea.  No constipation. Genitourinary: Negative for dysuria. Musculoskeletal: Negative for back pain. Endorses left hip pain. Skin: Negative for rash. Neurological: Negative for headaches, focal weakness or numbness.  10-point ROS otherwise negative.  ____________________________________________   PHYSICAL EXAM:  VITAL SIGNS: ED Triage Vitals  Enc Vitals Group     BP 02/13/15 1755 95/80 mmHg     Pulse Rate  02/13/15 1755 91     Resp 02/13/15 1755 16     Temp 02/13/15 1755 98.1 F (36.7 C)     Temp Source 02/13/15 1755 Oral     SpO2 02/13/15 1755 96 %     Weight 02/13/15 1755  260 lb (117.935 kg)     Height 02/13/15 1755 5\' 1"  (1.549 m)     Head Cir --      Peak Flow --      Pain Score 02/13/15 1755 10     Pain Loc --      Pain Edu? --      Excl. in [place]? --     Constitutional: Alert and oriented. Well appearing and in no acute distress. Eyes: Conjunctivae are normal. PERRL. EOMI. Head: Atraumatic. Nose: No congestion/rhinnorhea. Mouth/Throat: Mucous membranes are moist.  Oropharynx non-erythematous. Neck: No stridor.   Cardiovascular: Normal rate, regular rhythm. Grossly normal heart sounds.  Good peripheral circulation. Respiratory: Normal respiratory effort.  No retractions. Lungs CTAB. Gastrointestinal: Soft and nontender. No distention. No abdominal bruits. No CVA tenderness. Musculoskeletal: No visible deformity to left hip when compared with right. Patient has sharp tenderness to palpation over the trochanteric region. No palpable abnormality. Limited range of motion due to pain. Sensation and pulses are intact distally. Examination of lumbar spine and left knee are unremarkable..  No joint effusions. Neurologic:  Normal speech and language. No gross focal neurologic deficits are appreciated. No gait instability. Skin:  Skin is warm, dry and intact. No rash noted. Psychiatric: Mood and affect are normal. Speech and behavior are normal.  ____________________________________________   LABS (all labs ordered are listed, but only abnormal results are displayed)  Labs Reviewed - No data to display ____________________________________________  EKG   ____________________________________________  RADIOLOGY  Left hip x-ray reviewed from 01/26/2015. Degenerative changes are appreciated with no fractures.  ____________________________________________   PROCEDURES  Procedure(s)  performed: None  Critical Care performed: No  ____________________________________________   INITIAL IMPRESSION / ASSESSMENT AND PLAN / ED COURSE  Pertinent labs & imaging results that were available during my care of the patient were reviewed by me and considered in my medical decision making (see chart for details).  Patient reports him or to department complaining of left hip pain. This is an aggravation of a chronic pain issue. Patient was informed to follow-up with orthopedics from previous visits but patient states that she was waiting for orthopedics to call her and has never contacted orthopedics. Patient's imaging from 01/26/2015 was reviewed. There is degenerative changes consistent with osteoarthritis to left hip. Patient is very tender to palpation over the trochanteric region left hip. The patient's diagnosis is consistent with osteoarthritis and trochanteric bursitis. Patient is to follow-up with orthopedics. Patient is given a shot of Toradol in the emergency department and is discharged home with anti-inflammatories. ____________________________________________   FINAL CLINICAL IMPRESSION(S) / ED DIAGNOSES  Final diagnoses:  Osteoarthritis of left hip, unspecified osteoarthritis type  Trochanteric bursitis of left hip      Darletta Moll, PA-C 02/13/15 1930  Nena Polio, MD 02/13/15 [phone]

## 2015-02-13 NOTE — ED Notes (Signed)
Pt from home with pain in her left hip. She has been seen here on multiple occasions for this hip pain and states that she is waiting for the orthopedist to contact her. She has been given orthopedist contact info on each visit but "doesn't read" her discharge papers and doesn't know who to call. She is tearful during assessment. Pt states she went shopping yesterday and woke up this morning unable to move well and in pain. Pt alert & oriented.

## 2015-02-14 DIAGNOSIS — F3189 Other bipolar disorder: Secondary | ICD-10-CM | POA: Diagnosis not present

## 2015-02-15 ENCOUNTER — Ambulatory Visit (INDEPENDENT_AMBULATORY_CARE_PROVIDER_SITE_OTHER): Payer: Commercial Managed Care - HMO | Admitting: Family Medicine

## 2015-02-15 ENCOUNTER — Encounter: Payer: Self-pay | Admitting: Family Medicine

## 2015-02-15 VITALS — BP 138/86 | HR 105 | Temp 98.6°F | Resp 16 | Wt 261.5 lb

## 2015-02-15 DIAGNOSIS — M25552 Pain in left hip: Secondary | ICD-10-CM | POA: Diagnosis not present

## 2015-02-15 DIAGNOSIS — F3189 Other bipolar disorder: Secondary | ICD-10-CM | POA: Diagnosis not present

## 2015-02-15 MED ORDER — KETOROLAC TROMETHAMINE 60 MG/2ML IM SOLN
60.0000 mg | Freq: Once | INTRAMUSCULAR | Status: AC
  Administered 2015-02-15: 60 mg via INTRAMUSCULAR

## 2015-02-15 NOTE — Progress Notes (Signed)
Name: Nicole Austin   MRN: [id number]    DOB: Sep 19, 1960   Date:02/15/2015       Progress Note  Subjective  Chief Complaint  Chief Complaint  Patient presents with  . Hip Pain    patient needs an appt with Dr. Hessie Knows with [facility]. patient was told she has osteoarthritis of left hip with trochanteric bursitis     HPI  Nicole Austin is here today to complain about her hip pain. She has an appointment with [facility] on 02/24/15 at 3 pm. She is convinced she needs a hip replacement and is upset about this.   Nicole Austin has frequented the ER with complaints of pain recently. She denies current suicidal thoughts or plans. She continues to complain of pain in multiple joints and lower extremity spasms. She is followed by [facility] for long history of multi-level spinal pain, sciatica, fibromyalgia all complicated by her frequently fluctuating mood disorder.  More recently she has been having worsening left hip pain. Was seen in the local ER and x-ray revealed possible avascular necrosis without evidence of fracture. She's had persistent pain with standing in the left groin, left buttock and left lower back. She denies any abdominal pain, no fevers or chills.   Other providers: psychiatry specialist Dr. Faith Rogue pain management specialist Dr. Lance Bosch cardiologist Dr. Ida Rogue  Past Medical History  Diagnosis Date  . COPD (chronic obstructive pulmonary disease) ([place])   . PTSD (post-traumatic stress disorder)   . Rhabdomyolysis   . Bipolar 1 disorder ([place])   . Arthritis   . Stroke ([place])   . MI (myocardial infarction) ([place])   . Depression   . ADHD (attention deficit hyperactivity disorder)   . Asthma     Patient Active Problem List   Diagnosis Date Noted  . Left hip pain 01/31/2015  . Chest pain 12/31/2014  . Bipolar I disorder, most recent episode depressed ([place]) 12/22/2014  . Morbid obesity ([place]) 12/22/2014  . Rash and nonspecific skin eruption  11/16/2014  . Burn of finger 09/23/2014  . Anxiety 08/16/2014  . Affective bipolar disorder ([place]) 08/16/2014  . Gout 08/16/2014  . H/O gastrointestinal disease 08/16/2014  . Extreme obesity ([place]) 08/16/2014  . Muscle spasms of both lower extremities 08/16/2014  . Back pain, chronic 08/13/2014  . Low back pain with sciatica 08/04/2014  . Compulsive tobacco user syndrome 08/04/2014  . Current tobacco use 08/04/2014  . Polysubstance abuse 07/04/2014  . Anxiety, generalized 11/24/2013  . Imbalance 11/24/2013  . Blurred vision 11/24/2013  . Cephalalgia 11/24/2013  . COPD, moderate ([place]) 11/18/2013  . Moderate COPD (chronic obstructive pulmonary disease) ([place]) 11/18/2013  . HPV (human papilloma virus) infection 07/17/2013  . Arthritis of knee, degenerative 07/15/2013  . H/O neoplasm 07/31/2011    Social History  Substance Use Topics  . Smoking status: Current Every Day Smoker -- 0.50 packs/day    Types: Cigarettes    Start date: 10/11/1984  . Smokeless tobacco: Never Used  . Alcohol Use: No     Current outpatient prescriptions:  .  albuterol (PROVENTIL HFA;VENTOLIN HFA) 108 (90 BASE) MCG/ACT inhaler, Inhale 1-2 puffs into the lungs every 6 (six) hours as needed for wheezing or shortness of breath., Disp: 6.7 g, Rfl: 1 .  ALPRAZolam (XANAX) 1 MG tablet, Take 1 tablet (1 mg total) by mouth 4 (four) times daily as needed for anxiety., Disp: 120 tablet, Rfl: 2 .  cyclobenzaprine (FLEXERIL) 5 MG tablet, Take 2 tablets (10 mg  total) by mouth 3 (three) times daily as needed for muscle spasms., Disp: 90 tablet, Rfl: 5 .  divalproex (DEPAKOTE) 250 MG DR tablet, Take 1 tablet (250 mg total) by mouth 2 (two) times daily., Disp: 60 tablet, Rfl: 1 .  escitalopram (LEXAPRO) 20 MG tablet, Take 1 tablet (20 mg total) by mouth daily., Disp: 30 tablet, Rfl: 2 .  lidocaine (XYLOCAINE) 5 % ointment, Apply 1 application topically as needed for mild pain., Disp: , Rfl:  .  LYRICA 200 MG capsule, Take 1  capsule (200 mg total) by mouth 2 (two) times daily., Disp: 60 capsule, Rfl: 5 .  meloxicam (MOBIC) 15 MG tablet, Take 1 tablet (15 mg total) by mouth daily., Disp: 30 tablet, Rfl: 0 .  Misc Natural Products (OSTEO BI-FLEX ADV TRIPLE ST) TABS, Take 1 tablet by mouth 2 (two) times daily., Disp: , Rfl:  .  Multiple Vitamin (MULTIVITAMIN WITH MINERALS) TABS tablet, Take 1 tablet by mouth daily., Disp: , Rfl:  .  naproxen (NAPROSYN) 500 MG tablet, Take 1 tablet (500 mg total) by mouth 2 (two) times daily with a meal., Disp: 30 tablet, Rfl: 0 .  oxyCODONE-acetaminophen (ROXICET) 5-325 MG tablet, Take 1 tablet by mouth every 6 (six) hours as needed., Disp: 20 tablet, Rfl: 0 .  QUEtiapine (SEROQUEL XR) 300 MG 24 hr tablet, Take 1 tablet (300 mg total) by mouth every evening., Disp: 30 tablet, Rfl: 2 .  traMADol (ULTRAM) 50 MG tablet, Take 1 tablet (50 mg total) by mouth 3 (three) times daily after meals., Disp: 42 tablet, Rfl: 0 .  traZODone (DESYREL) 100 MG tablet, Take 1 tablet (100 mg total) by mouth 2 (two) times daily., Disp: 60 tablet, Rfl: 1  Past Surgical History  Procedure Laterality Date  . Replacement total knee Left     Family History  Problem Relation Age of Onset  . Hernia Mother   . Heart disease Mother   . OCD Mother   . Diabetes Mother   . Parkinson's disease Father   . Bipolar disorder Sister   . Schizophrenia Sister   . ADD / ADHD Sister   . Alcohol abuse Brother   . Bipolar disorder Sister   . Paranoid behavior Sister   . ADD / ADHD Sister     Allergies  Allergen Reactions  . Codeine Itching     Review of Systems  CONSTITUTIONAL: No significant weight changes, fever, chills, weakness or fatigue.  SKIN: No rash or itching.  CARDIOVASCULAR: No chest pain, chest pressure or chest discomfort. No palpitations or edema.  RESPIRATORY: No shortness of breath, cough or sputum.  NEUROLOGICAL: No headache, dizziness, syncope, paralysis, ataxia, numbness or tingling in the  extremities. No memory changes. No change in bowel or bladder control.  MUSCULOSKELETAL: Chronic joint pain. No muscle pain. PSYCHIATRIC: No change in mood. No change in sleep pattern.  ENDOCRINOLOGIC: No reports of sweating, cold or heat intolerance. No polyuria or polydipsia.     Objective  BP 138/86 mmHg  Pulse 105  Temp(Src) 98.6 F (37 C) (Oral)  Resp 16  Wt 261 lb 8 oz (118.616 kg)  SpO2 94%  LMP  (LMP Unknown) Body mass index is 49.44 kg/(m^2).  Physical Exam  Constitutional: Patient is obese and well-nourished. In no distress. Using a seated walker today but able to get up and ambulate with no assistance today. Cardiovascular: Normal rate, regular rhythm and normal heart sounds. No murmur heard.  Pulmonary/Chest: Effort normal and breath sounds normal.  No respiratory distress. Musculoskeletal: Normal range of motion bilateral UE and LE, no joint effusions. Patient reports more pain with external and internal rotation of left hip more then flexion/extension movements.  Peripheral vascular: Bilateral LE no edema. Neurological: CN II-XII grossly intact with no focal deficits. Alert and oriented to person, place, and time.  Skin: Skin is warm and dry. No rash noted. No erythema.  Psychiatric: Patient has sad but stable mood and affect. Behavior is normal in office today.    Assessment & Plan  1. Left hip pain Follow up with pain management specialist and keep Ortho appointment. No one has told her she needs a hip replacement yet. I am not sure where she assumed this from. I reassured her that we are investigating into her left hip pain and her Orthopedist will likely conduct more imaging and then recommend treatment.   - ketorolac (TORADOL) injection 60 mg; Inject 2 mLs (60 mg total) into the muscle once.

## 2015-02-16 ENCOUNTER — Other Ambulatory Visit: Payer: Self-pay

## 2015-02-16 DIAGNOSIS — F3189 Other bipolar disorder: Secondary | ICD-10-CM | POA: Diagnosis not present

## 2015-02-16 MED ORDER — TRAMADOL HCL 50 MG PO TABS: 50.0000 mg | ORAL_TABLET | Freq: Three times a day (TID) | ORAL | Status: DC | PRN

## 2015-02-16 NOTE — Telephone Encounter (Signed)
Patient called stating that she is in a lot of pain and has just found out that her pain doctor is out of the [place]. She wanted to know if something could be called in for her.  After consulting with Dr. Nadine Counts, she agreed to submit a rx for tramadol to her preferred pharmacy.  Refill request was sent to Dr. Bobetta Lime for approval and submission.

## 2015-02-17 DIAGNOSIS — F3189 Other bipolar disorder: Secondary | ICD-10-CM | POA: Diagnosis not present

## 2015-02-18 DIAGNOSIS — F3189 Other bipolar disorder: Secondary | ICD-10-CM | POA: Diagnosis not present

## 2015-02-23 NOTE — Progress Notes (Signed)
Pharmacy notified.

## 2015-02-24 ENCOUNTER — Ambulatory Visit: Payer: Commercial Managed Care - HMO | Admitting: Licensed Clinical Social Worker

## 2015-02-24 DIAGNOSIS — F3189 Other bipolar disorder: Secondary | ICD-10-CM | POA: Diagnosis not present

## 2015-02-25 DIAGNOSIS — F3189 Other bipolar disorder: Secondary | ICD-10-CM | POA: Diagnosis not present

## 2015-03-01 ENCOUNTER — Encounter: Payer: Self-pay | Admitting: Emergency Medicine

## 2015-03-01 ENCOUNTER — Emergency Department: Payer: Commercial Managed Care - HMO

## 2015-03-01 ENCOUNTER — Inpatient Hospital Stay
Admission: EM | Admit: 2015-03-01 | Discharge: 2015-03-08 | DRG: 871 | Disposition: A | Discharge status: Home / Self Care | Payer: Commercial Managed Care - HMO | Attending: Internal Medicine | Admitting: Internal Medicine

## 2015-03-01 DIAGNOSIS — G629 Polyneuropathy, unspecified: Secondary | ICD-10-CM | POA: Diagnosis present

## 2015-03-01 DIAGNOSIS — M549 Dorsalgia, unspecified: Secondary | ICD-10-CM | POA: Diagnosis present

## 2015-03-01 DIAGNOSIS — Z96652 Presence of left artificial knee joint: Secondary | ICD-10-CM | POA: Diagnosis present

## 2015-03-01 DIAGNOSIS — E875 Hyperkalemia: Secondary | ICD-10-CM | POA: Diagnosis not present

## 2015-03-01 DIAGNOSIS — I252 Old myocardial infarction: Secondary | ICD-10-CM

## 2015-03-01 DIAGNOSIS — Z820 Family history of epilepsy and other diseases of the nervous system: Secondary | ICD-10-CM

## 2015-03-01 DIAGNOSIS — J80 Acute respiratory distress syndrome: Secondary | ICD-10-CM | POA: Diagnosis not present

## 2015-03-01 DIAGNOSIS — J441 Chronic obstructive pulmonary disease with (acute) exacerbation: Secondary | ICD-10-CM | POA: Diagnosis present

## 2015-03-01 DIAGNOSIS — R0902 Hypoxemia: Secondary | ICD-10-CM | POA: Diagnosis not present

## 2015-03-01 DIAGNOSIS — G9341 Metabolic encephalopathy: Secondary | ICD-10-CM

## 2015-03-01 DIAGNOSIS — R7989 Other specified abnormal findings of blood chemistry: Secondary | ICD-10-CM | POA: Diagnosis not present

## 2015-03-01 DIAGNOSIS — R7401 Elevation of levels of liver transaminase levels: Secondary | ICD-10-CM

## 2015-03-01 DIAGNOSIS — Z8701 Personal history of pneumonia (recurrent): Secondary | ICD-10-CM | POA: Diagnosis not present

## 2015-03-01 DIAGNOSIS — G8929 Other chronic pain: Secondary | ICD-10-CM | POA: Diagnosis present

## 2015-03-01 DIAGNOSIS — Z8249 Family history of ischemic heart disease and other diseases of the circulatory system: Secondary | ICD-10-CM | POA: Diagnosis not present

## 2015-03-01 DIAGNOSIS — R0689 Other abnormalities of breathing: Secondary | ICD-10-CM | POA: Diagnosis not present

## 2015-03-01 DIAGNOSIS — F17210 Nicotine dependence, cigarettes, uncomplicated: Secondary | ICD-10-CM | POA: Diagnosis not present

## 2015-03-01 DIAGNOSIS — J440 Chronic obstructive pulmonary disease with acute lower respiratory infection: Secondary | ICD-10-CM | POA: Diagnosis not present

## 2015-03-01 DIAGNOSIS — M1990 Unspecified osteoarthritis, unspecified site: Secondary | ICD-10-CM | POA: Diagnosis present

## 2015-03-01 DIAGNOSIS — R740 Nonspecific elevation of levels of transaminase and lactic acid dehydrogenase [LDH]: Secondary | ICD-10-CM | POA: Diagnosis present

## 2015-03-01 DIAGNOSIS — J45909 Unspecified asthma, uncomplicated: Secondary | ICD-10-CM | POA: Diagnosis not present

## 2015-03-01 DIAGNOSIS — J189 Pneumonia, unspecified organism: Secondary | ICD-10-CM | POA: Diagnosis present

## 2015-03-01 DIAGNOSIS — F419 Anxiety disorder, unspecified: Secondary | ICD-10-CM | POA: Diagnosis present

## 2015-03-01 DIAGNOSIS — Z8673 Personal history of transient ischemic attack (TIA), and cerebral infarction without residual deficits: Secondary | ICD-10-CM

## 2015-03-01 DIAGNOSIS — Z833 Family history of diabetes mellitus: Secondary | ICD-10-CM | POA: Diagnosis not present

## 2015-03-01 DIAGNOSIS — Z9981 Dependence on supplemental oxygen: Secondary | ICD-10-CM | POA: Diagnosis not present

## 2015-03-01 DIAGNOSIS — Z886 Allergy status to analgesic agent status: Secondary | ICD-10-CM | POA: Diagnosis not present

## 2015-03-01 DIAGNOSIS — A419 Sepsis, unspecified organism: Secondary | ICD-10-CM | POA: Diagnosis not present

## 2015-03-01 DIAGNOSIS — F329 Major depressive disorder, single episode, unspecified: Secondary | ICD-10-CM | POA: Diagnosis present

## 2015-03-01 DIAGNOSIS — J181 Lobar pneumonia, unspecified organism: Secondary | ICD-10-CM

## 2015-03-01 DIAGNOSIS — G40909 Epilepsy, unspecified, not intractable, without status epilepticus: Secondary | ICD-10-CM | POA: Diagnosis present

## 2015-03-01 DIAGNOSIS — Z79899 Other long term (current) drug therapy: Secondary | ICD-10-CM

## 2015-03-01 DIAGNOSIS — J9601 Acute respiratory failure with hypoxia: Secondary | ICD-10-CM | POA: Diagnosis not present

## 2015-03-01 DIAGNOSIS — J449 Chronic obstructive pulmonary disease, unspecified: Secondary | ICD-10-CM | POA: Diagnosis not present

## 2015-03-01 DIAGNOSIS — M109 Gout, unspecified: Secondary | ICD-10-CM | POA: Diagnosis present

## 2015-03-01 DIAGNOSIS — F319 Bipolar disorder, unspecified: Secondary | ICD-10-CM

## 2015-03-01 DIAGNOSIS — Z818 Family history of other mental and behavioral disorders: Secondary | ICD-10-CM

## 2015-03-01 DIAGNOSIS — M6281 Muscle weakness (generalized): Secondary | ICD-10-CM | POA: Diagnosis not present

## 2015-03-01 DIAGNOSIS — R0602 Shortness of breath: Secondary | ICD-10-CM | POA: Diagnosis not present

## 2015-03-01 DIAGNOSIS — R918 Other nonspecific abnormal finding of lung field: Secondary | ICD-10-CM | POA: Diagnosis not present

## 2015-03-01 DIAGNOSIS — F3176 Bipolar disorder, in full remission, most recent episode depressed: Secondary | ICD-10-CM | POA: Diagnosis present

## 2015-03-01 MED ORDER — IPRATROPIUM-ALBUTEROL 0.5-2.5 (3) MG/3ML IN SOLN
3.0000 mL | Freq: Once | RESPIRATORY_TRACT | Status: AC
  Administered 2015-03-01: 3 mL via RESPIRATORY_TRACT

## 2015-03-01 MED ORDER — DEXTROSE 5 % IV SOLN
1.0000 g | Freq: Once | INTRAVENOUS | Status: AC
  Administered 2015-03-02: 1 g via INTRAVENOUS
  Filled 2015-03-01: qty 10

## 2015-03-01 MED ORDER — IPRATROPIUM-ALBUTEROL 0.5-2.5 (3) MG/3ML IN SOLN
RESPIRATORY_TRACT | Status: AC
  Administered 2015-03-01: 3 mL via RESPIRATORY_TRACT
  Filled 2015-03-01: qty 6

## 2015-03-01 MED ORDER — DEXTROSE 5 % IV SOLN
500.0000 mg | Freq: Once | INTRAVENOUS | Status: AC
  Administered 2015-03-02: 500 mg via INTRAVENOUS
  Filled 2015-03-01: qty 500

## 2015-03-01 NOTE — ED Provider Notes (Signed)
[facility] Emergency Department Provider Note  ____________________________________________  Time seen: Approximately 11:36 PM  I have reviewed the triage vital signs and the nursing notes.   HISTORY  Chief Complaint Respiratory Distress    HPI Nicole Austin is a 55 y.o. female patient reports she's not been feeling good for several days not eating much feeling cold. She was then told she had a fever she's had a cough which is nonproductive. She feels short of breath. She cannot tell me exactly when this started but again his been going on for several days. She does not have any chest pain at this point.   Past Medical History  Diagnosis Date  . COPD (chronic obstructive pulmonary disease) ([place])   . PTSD (post-traumatic stress disorder)   . Rhabdomyolysis   . Bipolar 1 disorder ([place])   . Arthritis   . Stroke ([place])   . MI (myocardial infarction) ([place])   . Depression   . ADHD (attention deficit hyperactivity disorder)   . Asthma     Patient Active Problem List   Diagnosis Date Noted  . Left hip pain 01/31/2015  . Chest pain 12/31/2014  . Bipolar I disorder, most recent episode depressed ([place]) 12/22/2014  . Morbid obesity ([place]) 12/22/2014  . Rash and nonspecific skin eruption 11/16/2014  . Burn of finger 09/23/2014  . Anxiety 08/16/2014  . Affective bipolar disorder ([place]) 08/16/2014  . Gout 08/16/2014  . H/O gastrointestinal disease 08/16/2014  . Extreme obesity ([place]) 08/16/2014  . Muscle spasms of both lower extremities 08/16/2014  . Back pain, chronic 08/13/2014  . Low back pain with sciatica 08/04/2014  . Compulsive tobacco user syndrome 08/04/2014  . Current tobacco use 08/04/2014  . Polysubstance abuse 07/04/2014  . Anxiety, generalized 11/24/2013  . Imbalance 11/24/2013  . Blurred vision 11/24/2013  . Cephalalgia 11/24/2013  . COPD, moderate ([place]) 11/18/2013  . Moderate COPD (chronic obstructive pulmonary disease) ([place])  11/18/2013  . HPV (human papilloma virus) infection 07/17/2013  . Arthritis of knee, degenerative 07/15/2013  . H/O neoplasm 07/31/2011    Past Surgical History  Procedure Laterality Date  . Replacement total knee Left     Current Outpatient Rx  Name  Route  Sig  Dispense  Refill  . albuterol (PROVENTIL HFA;VENTOLIN HFA) 108 (90 BASE) MCG/ACT inhaler   Inhalation   Inhale 1-2 puffs into the lungs every 6 (six) hours as needed for wheezing or shortness of breath.   6.7 g   1   . ALPRAZolam (XANAX) 1 MG tablet   Oral   Take 1 tablet (1 mg total) by mouth 4 (four) times daily as needed for anxiety.   120 tablet   2   . cyclobenzaprine (FLEXERIL) 5 MG tablet   Oral   Take 2 tablets (10 mg total) by mouth 3 (three) times daily as needed for muscle spasms.   90 tablet   5   . divalproex (DEPAKOTE) 250 MG DR tablet   Oral   Take 1 tablet (250 mg total) by mouth 2 (two) times daily.   60 tablet   1   . escitalopram (LEXAPRO) 20 MG tablet   Oral   Take 1 tablet (20 mg total) by mouth daily.   30 tablet   2   . lidocaine (XYLOCAINE) 5 % ointment   Topical   Apply 1 application topically as needed for mild pain.         Marland Kitchen LYRICA 200 MG capsule  Oral   Take 1 capsule (200 mg total) by mouth 2 (two) times daily.   60 capsule   5     Dispense as written.   . meloxicam (MOBIC) 15 MG tablet   Oral   Take 1 tablet (15 mg total) by mouth daily.   30 tablet   0   . Misc Natural Products (OSTEO BI-FLEX ADV TRIPLE ST) TABS   Oral   Take 1 tablet by mouth 2 (two) times daily.         . Multiple Vitamin (MULTIVITAMIN WITH MINERALS) TABS tablet   Oral   Take 1 tablet by mouth daily.         . naproxen (NAPROSYN) 500 MG tablet   Oral   Take 1 tablet (500 mg total) by mouth 2 (two) times daily with a meal.   30 tablet   0   . QUEtiapine (SEROQUEL XR) 300 MG 24 hr tablet   Oral   Take 1 tablet (300 mg total) by mouth every evening.   30 tablet   2   .  traMADol (ULTRAM) 50 MG tablet   Oral   Take 1 tablet (50 mg total) by mouth every 8 (eight) hours as needed.   60 tablet   0     Call in to [place], refill 02/16/15, [phone]   . traZODone (DESYREL) 100 MG tablet   Oral   Take 1 tablet (100 mg total) by mouth 2 (two) times daily.   60 tablet   1     Allergies Codeine  Family History  Problem Relation Age of Onset  . Hernia Mother   . Heart disease Mother   . OCD Mother   . Diabetes Mother   . Parkinson's disease Father   . Bipolar disorder Sister   . Schizophrenia Sister   . ADD / ADHD Sister   . Alcohol abuse Brother   . Bipolar disorder Sister   . Paranoid behavior Sister   . ADD / ADHD Sister     Social History Social History  Substance Use Topics  . Smoking status: Current Every Day Smoker -- 0.50 packs/day    Types: Cigarettes    Start date: 10/11/1984  . Smokeless tobacco: Never Used  . Alcohol Use: No    Review of Systems Constitutional:fever/chills Eyes: No visual changes. ENT: No sore throat. Cardiovascular: Denies chest pain. Respiratory: See history of present illness Gastrointestinal: No abdominal pain.  No nausea, no vomiting.  No diarrhea.  No constipation. Genitourinary: Negative for dysuria. Musculoskeletal: Negative for back pain. Skin: Negative for rash. Neurological: Negative for headaches, focal weakness or numbness.  10-point ROS otherwise negative.  ____________________________________________   PHYSICAL EXAM:  VITAL SIGNS: ED Triage Vitals  Enc Vitals Group     BP 03/01/15 2315 128/51 mmHg     Pulse Rate 03/01/15 2315 119     Resp 03/01/15 2315 28     Temp 03/01/15 2315 101.3 F (38.5 C)     Temp Source 03/01/15 2315 Oral     SpO2 03/01/15 2315 81 %     Weight 03/01/15 2315 261 lb (118.389 kg)     Height 03/01/15 2315 5\' 1"  (1.549 m)     Head Cir --      Peak Flow --      Pain Score 03/01/15 2317 8     Pain Loc --      Pain Edu? --      Excl. in [place]? --  Constitutional: Alert and oriented. Looks short of breath Eyes: Conjunctivae are normal. PERRL. EOMI. Head: Atraumatic. Nose: No congestion/rhinnorhea. Mouth/Throat: Mucous membranes are somewhat dry  Oropharynx non-erythematous. Neck: No stridor. Cardiovascular: Normal rate, regular rhythm. Grossly normal heart sounds.  Good peripheral circulation. Respiratory: Normal respiratory effort.  No retractions. Lungs crackles in bases especially on right. Gastrointestinal: Soft and nontender. No distention. No abdominal bruits. No CVA tenderness. Musculoskeletal: No lower extremity tenderness or lateral edema.  No joint effusions. Neurologic:  Normal speech and language. No gross focal neurologic deficits are appreciated. No gait instability. Skin:  Skin is warm, dry and intact. No rash noted. Psychiatric: Mood and affect are normal. Speech and behavior are normal.  ____________________________________________   LABS (all labs ordered are listed, but only abnormal results are displayed)  Labs Reviewed  CBC  COMPREHENSIVE METABOLIC PANEL  BRAIN NATRIURETIC PEPTIDE  TROPONIN I  LACTIC ACID, PLASMA  LACTIC ACID, PLASMA   ____________________________________________  EKG  EKG read and interpreted by me shows sinus tach at a rate of 1:15 normal axis nonspecific ST-T wave changes ____________________________________________  RADIOLOGY  Chest x-ray read and interpreted by me waiting for the radiologist's reading to make official however looks like a right-sided right lower lobe pneumonia. ____________________________________________   PROCEDURES    ____________________________________________   INITIAL IMPRESSION / ASSESSMENT AND PLAN / ED COURSE  Pertinent labs & imaging results that were available during my care of the patient were reviewed by me and considered in my medical decision making (see chart for  details).   ____________________________________________   FINAL CLINICAL IMPRESSION(S) / ED DIAGNOSES  Final diagnoses:  Community acquired pneumonia  Hypoxia      Nena Polio, MD 03/01/15 [phone]

## 2015-03-01 NOTE — ED Notes (Addendum)
Pt presents to ED from home by EMS with c/o difficulty breathing for the past 4 days. Friends had come to her house and were concerned about her breathing and her living conditions so they called EMS. O2 saturation was 76% upon their arrival. Pt had expiratory wheezing persent and appeared to be in distress at that time. Pt currently has increased work of breathing noted. BP 148/116 per EMS. Pt alert and able to answer questions.

## 2015-03-02 ENCOUNTER — Encounter: Payer: Self-pay | Admitting: Internal Medicine

## 2015-03-02 DIAGNOSIS — J189 Pneumonia, unspecified organism: Secondary | ICD-10-CM | POA: Diagnosis present

## 2015-03-02 DIAGNOSIS — E875 Hyperkalemia: Secondary | ICD-10-CM | POA: Diagnosis present

## 2015-03-02 DIAGNOSIS — J440 Chronic obstructive pulmonary disease with acute lower respiratory infection: Secondary | ICD-10-CM | POA: Diagnosis present

## 2015-03-02 DIAGNOSIS — F17210 Nicotine dependence, cigarettes, uncomplicated: Secondary | ICD-10-CM | POA: Diagnosis present

## 2015-03-02 DIAGNOSIS — Z79899 Other long term (current) drug therapy: Secondary | ICD-10-CM | POA: Diagnosis not present

## 2015-03-02 DIAGNOSIS — A419 Sepsis, unspecified organism: Secondary | ICD-10-CM | POA: Diagnosis present

## 2015-03-02 DIAGNOSIS — F329 Major depressive disorder, single episode, unspecified: Secondary | ICD-10-CM | POA: Diagnosis present

## 2015-03-02 DIAGNOSIS — Z833 Family history of diabetes mellitus: Secondary | ICD-10-CM | POA: Diagnosis not present

## 2015-03-02 DIAGNOSIS — J9601 Acute respiratory failure with hypoxia: Secondary | ICD-10-CM | POA: Diagnosis present

## 2015-03-02 DIAGNOSIS — Z8249 Family history of ischemic heart disease and other diseases of the circulatory system: Secondary | ICD-10-CM | POA: Diagnosis not present

## 2015-03-02 DIAGNOSIS — M109 Gout, unspecified: Secondary | ICD-10-CM | POA: Diagnosis present

## 2015-03-02 DIAGNOSIS — F3176 Bipolar disorder, in full remission, most recent episode depressed: Secondary | ICD-10-CM | POA: Diagnosis present

## 2015-03-02 DIAGNOSIS — Z8673 Personal history of transient ischemic attack (TIA), and cerebral infarction without residual deficits: Secondary | ICD-10-CM | POA: Diagnosis not present

## 2015-03-02 DIAGNOSIS — Z818 Family history of other mental and behavioral disorders: Secondary | ICD-10-CM | POA: Diagnosis not present

## 2015-03-02 DIAGNOSIS — J45909 Unspecified asthma, uncomplicated: Secondary | ICD-10-CM | POA: Diagnosis present

## 2015-03-02 DIAGNOSIS — F319 Bipolar disorder, unspecified: Secondary | ICD-10-CM | POA: Diagnosis not present

## 2015-03-02 DIAGNOSIS — G40909 Epilepsy, unspecified, not intractable, without status epilepticus: Secondary | ICD-10-CM | POA: Diagnosis present

## 2015-03-02 DIAGNOSIS — R7989 Other specified abnormal findings of blood chemistry: Secondary | ICD-10-CM | POA: Diagnosis present

## 2015-03-02 DIAGNOSIS — G629 Polyneuropathy, unspecified: Secondary | ICD-10-CM | POA: Diagnosis present

## 2015-03-02 DIAGNOSIS — I252 Old myocardial infarction: Secondary | ICD-10-CM | POA: Diagnosis not present

## 2015-03-02 DIAGNOSIS — Z8701 Personal history of pneumonia (recurrent): Secondary | ICD-10-CM | POA: Diagnosis not present

## 2015-03-02 DIAGNOSIS — G9341 Metabolic encephalopathy: Secondary | ICD-10-CM | POA: Diagnosis present

## 2015-03-02 DIAGNOSIS — F419 Anxiety disorder, unspecified: Secondary | ICD-10-CM | POA: Diagnosis present

## 2015-03-02 DIAGNOSIS — R740 Nonspecific elevation of levels of transaminase and lactic acid dehydrogenase [LDH]: Secondary | ICD-10-CM | POA: Diagnosis present

## 2015-03-02 DIAGNOSIS — Z820 Family history of epilepsy and other diseases of the nervous system: Secondary | ICD-10-CM | POA: Diagnosis not present

## 2015-03-02 DIAGNOSIS — Z886 Allergy status to analgesic agent status: Secondary | ICD-10-CM | POA: Diagnosis not present

## 2015-03-02 DIAGNOSIS — J441 Chronic obstructive pulmonary disease with (acute) exacerbation: Secondary | ICD-10-CM | POA: Diagnosis present

## 2015-03-02 DIAGNOSIS — G8929 Other chronic pain: Secondary | ICD-10-CM | POA: Diagnosis present

## 2015-03-02 DIAGNOSIS — M1990 Unspecified osteoarthritis, unspecified site: Secondary | ICD-10-CM | POA: Diagnosis present

## 2015-03-02 DIAGNOSIS — Z96652 Presence of left artificial knee joint: Secondary | ICD-10-CM | POA: Diagnosis present

## 2015-03-02 DIAGNOSIS — M549 Dorsalgia, unspecified: Secondary | ICD-10-CM | POA: Diagnosis present

## 2015-03-02 LAB — BLOOD GAS, ARTERIAL
ACID-BASE EXCESS: 8.7 mmol/L — AB (ref 0.0–3.0)
Allens test (pass/fail): POSITIVE — AB
BICARBONATE: 34.1 meq/L — AB (ref 21.0–28.0)
FIO2: 34
O2 Saturation: 92.1 %
PATIENT TEMPERATURE: 37
PO2 ART: 61 mmHg — AB (ref 83.0–108.0)
pCO2 arterial: 49 mmHg — ABNORMAL HIGH (ref 32.0–48.0)
pH, Arterial: 7.45 (ref 7.350–7.450)

## 2015-03-02 LAB — COMPREHENSIVE METABOLIC PANEL
ALBUMIN: 3.3 g/dL — AB (ref 3.5–5.0)
ALT: 90 U/L — AB (ref 14–54)
AST: 413 U/L — AB (ref 15–41)
Alkaline Phosphatase: 75 U/L (ref 38–126)
Anion gap: 11 (ref 5–15)
BUN: 12 mg/dL (ref 6–20)
CHLORIDE: 99 mmol/L — AB (ref 101–111)
CO2: 26 mmol/L (ref 22–32)
CREATININE: 0.87 mg/dL (ref 0.44–1.00)
Calcium: 8.6 mg/dL — ABNORMAL LOW (ref 8.9–10.3)
GFR calc Af Amer: >60 mL/min (ref >60)
Glucose, Bld: 130 mg/dL — ABNORMAL HIGH (ref 65–99)
POTASSIUM: 5 mmol/L (ref 3.5–5.1)
SODIUM: 136 mmol/L (ref 135–145)
Total Bilirubin: 1.8 mg/dL — ABNORMAL HIGH (ref 0.3–1.2)
Total Protein: 6.9 g/dL (ref 6.5–8.1)

## 2015-03-02 LAB — CBC
HCT: 39.6 % (ref 35.0–47.0)
HEMATOCRIT: 36.7 % (ref 35.0–47.0)
Hemoglobin: 13.2 g/dL (ref 12.0–16.0)
Hemoglobin: 12.4 g/dL (ref 12.0–16.0)
MCH: 30 pg (ref 26.0–34.0)
MCH: 30.2 pg (ref 26.0–34.0)
MCHC: 33.3 g/dL (ref 32.0–36.0)
MCHC: 33.7 g/dL (ref 32.0–36.0)
MCV: 90.1 fL (ref 80.0–100.0)
MCV: 89.7 fL (ref 80.0–100.0)
PLATELETS: 172 10*3/uL (ref 150–440)
Platelets: 162 K/uL (ref 150–440)
RBC: 4.39 MIL/uL (ref 3.80–5.20)
RBC: 4.1 MIL/uL (ref 3.80–5.20)
RDW: 14.7 % — ABNORMAL HIGH (ref 11.5–14.5)
RDW: 15 % — AB (ref 11.5–14.5)
WBC: 10.3 K/uL (ref 3.6–11.0)
WBC: 10.5 10*3/uL (ref 3.6–11.0)

## 2015-03-02 LAB — CREATININE, SERUM
CREATININE: 0.81 mg/dL (ref 0.44–1.00)
GFR calc non Af Amer: >60 mL/min (ref >60)

## 2015-03-02 LAB — BASIC METABOLIC PANEL
Anion gap: 6 (ref 5–15)
BUN: 11 mg/dL (ref 6–20)
CALCIUM: 8.2 mg/dL — AB (ref 8.9–10.3)
CO2: 30 mmol/L (ref 22–32)
CREATININE: 0.83 mg/dL (ref 0.44–1.00)
Chloride: 100 mmol/L — ABNORMAL LOW (ref 101–111)
GFR calc Af Amer: >60 mL/min (ref >60)
GLUCOSE: 133 mg/dL — AB (ref 65–99)
Potassium: 4 mmol/L (ref 3.5–5.1)
SODIUM: 136 mmol/L (ref 135–145)

## 2015-03-02 LAB — BRAIN NATRIURETIC PEPTIDE: B Natriuretic Peptide: 159 pg/mL — ABNORMAL HIGH (ref 0.0–100.0)

## 2015-03-02 LAB — LACTIC ACID, PLASMA
Lactic Acid, Venous: 2.2 mmol/L (ref 0.5–2.0)
Lactic Acid, Venous: 2 mmol/L (ref 0.5–2.0)

## 2015-03-02 LAB — TROPONIN I

## 2015-03-02 MED ORDER — DEXTROSE 5 % IV SOLN
500.0000 mg | INTRAVENOUS | Status: DC
  Administered 2015-03-02 – 2015-03-03 (×2): 500 mg via INTRAVENOUS
  Filled 2015-03-02 (×5): qty 500

## 2015-03-02 MED ORDER — TRAMADOL HCL 50 MG PO TABS: 50.0000 mg | ORAL_TABLET | Freq: Four times a day (QID) | ORAL | Status: DC | PRN

## 2015-03-02 MED ORDER — ALPRAZOLAM 0.5 MG PO TABS
1.0000 mg | ORAL_TABLET | Freq: Four times a day (QID) | ORAL | Status: DC | PRN
  Administered 2015-03-03 – 2015-03-08 (×15): 1 mg via ORAL
  Filled 2015-03-02 (×15): qty 2

## 2015-03-02 MED ORDER — SENNOSIDES-DOCUSATE SODIUM 8.6-50 MG PO TABS
2.0000 | ORAL_TABLET | Freq: Two times a day (BID) | ORAL | Status: DC
  Administered 2015-03-02 – 2015-03-08 (×8): 2 via ORAL
  Filled 2015-03-02 (×10): qty 2

## 2015-03-02 MED ORDER — METHYLPREDNISOLONE SODIUM SUCC 40 MG IJ SOLR
40.0000 mg | Freq: Four times a day (QID) | INTRAMUSCULAR | Status: DC
  Administered 2015-03-02 – 2015-03-04 (×8): 40 mg via INTRAVENOUS
  Filled 2015-03-02 (×8): qty 1

## 2015-03-02 MED ORDER — ESCITALOPRAM OXALATE 10 MG PO TABS
20.0000 mg | ORAL_TABLET | Freq: Every day | ORAL | Status: DC
  Administered 2015-03-02 – 2015-03-08 (×7): 20 mg via ORAL
  Filled 2015-03-02 (×7): qty 2

## 2015-03-02 MED ORDER — ENOXAPARIN SODIUM 40 MG/0.4ML ~~LOC~~ SOLN
40.0000 mg | Freq: Two times a day (BID) | SUBCUTANEOUS | Status: DC
Start: 2015-03-02 — End: 2015-03-08
  Administered 2015-03-02 – 2015-03-08 (×12): 40 mg via SUBCUTANEOUS
  Filled 2015-03-02 (×12): qty 0.4

## 2015-03-02 MED ORDER — ONDANSETRON HCL 4 MG/2ML IJ SOLN
4.0000 mg | Freq: Four times a day (QID) | INTRAMUSCULAR | Status: DC | PRN
Start: 2015-03-02 — End: 2015-03-08

## 2015-03-02 MED ORDER — PREGABALIN 75 MG PO CAPS
200.0000 mg | ORAL_CAPSULE | Freq: Two times a day (BID) | ORAL | Status: DC
  Administered 2015-03-02 – 2015-03-08 (×14): 200 mg via ORAL
  Filled 2015-03-02 (×14): qty 1

## 2015-03-02 MED ORDER — LIDOCAINE 5 % EX OINT
1.0000 "application " | TOPICAL_OINTMENT | CUTANEOUS | Status: DC | PRN
  Filled 2015-03-02: qty 35.44

## 2015-03-02 MED ORDER — ONDANSETRON HCL 4 MG PO TABS: 4.0000 mg | ORAL_TABLET | Freq: Four times a day (QID) | ORAL | Status: DC | PRN

## 2015-03-02 MED ORDER — ACETAMINOPHEN 650 MG RE SUPP: 650.0000 mg | Freq: Four times a day (QID) | RECTAL | Status: DC | PRN

## 2015-03-02 MED ORDER — MELOXICAM 7.5 MG PO TABS
15.0000 mg | ORAL_TABLET | Freq: Every day | ORAL | Status: DC
  Administered 2015-03-02 – 2015-03-08 (×7): 15 mg via ORAL
  Filled 2015-03-02 (×7): qty 2

## 2015-03-02 MED ORDER — ADULT MULTIVITAMIN W/MINERALS CH
1.0000 | ORAL_TABLET | Freq: Every day | ORAL | Status: DC
  Administered 2015-03-02 – 2015-03-08 (×7): 1 via ORAL
  Filled 2015-03-02 (×7): qty 1

## 2015-03-02 MED ORDER — SODIUM CHLORIDE 0.9 % IV SOLN
INTRAVENOUS | Status: DC
  Administered 2015-03-02 – 2015-03-03 (×3): via INTRAVENOUS

## 2015-03-02 MED ORDER — TRAZODONE HCL 100 MG PO TABS
100.0000 mg | ORAL_TABLET | Freq: Two times a day (BID) | ORAL | Status: DC
  Administered 2015-03-02 (×2): 100 mg via ORAL
  Filled 2015-03-02 (×2): qty 1

## 2015-03-02 MED ORDER — CYCLOBENZAPRINE HCL 10 MG PO TABS: 10.0000 mg | ORAL_TABLET | Freq: Three times a day (TID) | ORAL | Status: DC | PRN

## 2015-03-02 MED ORDER — SODIUM CHLORIDE 0.9 % IV SOLN: Freq: Once | INTRAVENOUS | Status: DC

## 2015-03-02 MED ORDER — QUETIAPINE FUMARATE ER 300 MG PO TB24
300.0000 mg | ORAL_TABLET | Freq: Every evening | ORAL | Status: DC
  Administered 2015-03-03 – 2015-03-07 (×5): 300 mg via ORAL
  Filled 2015-03-02 (×7): qty 1

## 2015-03-02 MED ORDER — IPRATROPIUM-ALBUTEROL 0.5-2.5 (3) MG/3ML IN SOLN
3.0000 mL | RESPIRATORY_TRACT | Status: DC | PRN
  Administered 2015-03-02 – 2015-03-06 (×8): 3 mL via RESPIRATORY_TRACT
  Filled 2015-03-02 (×9): qty 3

## 2015-03-02 MED ORDER — ACETAMINOPHEN 325 MG PO TABS
650.0000 mg | ORAL_TABLET | Freq: Four times a day (QID) | ORAL | Status: DC | PRN
  Administered 2015-03-02: 650 mg via ORAL
  Filled 2015-03-02: qty 2

## 2015-03-02 MED ORDER — DIVALPROEX SODIUM 250 MG PO DR TAB
250.0000 mg | DELAYED_RELEASE_TABLET | Freq: Two times a day (BID) | ORAL | Status: DC
  Administered 2015-03-02 – 2015-03-08 (×14): 250 mg via ORAL
  Filled 2015-03-02 (×14): qty 1

## 2015-03-02 MED ORDER — HEPARIN SODIUM (PORCINE) 5000 UNIT/ML IJ SOLN
5000.0000 [IU] | Freq: Three times a day (TID) | INTRAMUSCULAR | Status: DC
  Administered 2015-03-02 (×2): 5000 [IU] via SUBCUTANEOUS
  Filled 2015-03-02 (×2): qty 1

## 2015-03-02 MED ORDER — DEXTROSE 5 % IV SOLN
1.0000 g | INTRAVENOUS | Status: DC
  Administered 2015-03-02 – 2015-03-07 (×6): 1 g via INTRAVENOUS
  Filled 2015-03-02 (×9): qty 10

## 2015-03-02 MED ORDER — OSTEO BI-FLEX ADV TRIPLE ST PO TABS: 1.0000 | ORAL_TABLET | Freq: Two times a day (BID) | ORAL | Status: DC

## 2015-03-02 NOTE — Care Management (Signed)
Patient lives at home alone.  Acute need for O2.  Attempted to assess.  Patient is sleeping and lethargic at time

## 2015-03-02 NOTE — Progress Notes (Signed)
Patient very lethargic and difficult to arouse.  MD stopped Trazadone and advised to hold seroquel this evening if still sedated.  ABG levels obtained and no new orders due to results.

## 2015-03-02 NOTE — Progress Notes (Signed)
Pt transferred to 148 from ED via stretcher. Pt oriented to unit/room. Skin team checked with Adonis Huguenin, RN, see assessment for details. No s/s distress at this time, will continue to monitor.

## 2015-03-02 NOTE — H&P (Signed)
[place] [facility] [place] NAME: Nicole Austin    MR#:  [id number]  DATE OF BIRTH:  [birth year]   DATE OF ADMISSION:  03/01/2015  PRIMARY CARE PHYSICIAN: Bobetta Lime, MD   REQUESTING/REFERRING PHYSICIAN: Malinda  CHIEF COMPLAINT:   Feels bad Cough, chills  HISTORY OF PRESENT ILLNESS:  Nicole Austin  is a 55 y.o. female with a known history of COPD non oxygen dependent who is presenting with cough and shortness of breath. She describes having acute duration of cold-like symptoms now with a nonproductive cough as well as having subjective fevers and chills. Her neighbor saw that she looks short of breath and called EMS on arrival she is hypoxic. Denies any further syncopal hematology at this time. On arrival to emergency department noted be febrile as well  PAST MEDICAL HISTORY:   Past Medical History  Diagnosis Date  . COPD (chronic obstructive pulmonary disease) ([place])   . PTSD (post-traumatic stress disorder)   . Rhabdomyolysis   . Bipolar 1 disorder ([place])   . Arthritis   . Stroke ([place])   . MI (myocardial infarction) ([place])   . Depression   . ADHD (attention deficit hyperactivity disorder)   . Asthma     PAST SURGICAL HISTORY:   Past Surgical History  Procedure Laterality Date  . Replacement total knee Left     SOCIAL HISTORY:   Social History  Substance Use Topics  . Smoking status: Current Every Day Smoker -- 0.50 packs/day    Types: Cigarettes    Start date: 10/11/1984  . Smokeless tobacco: Never Used  . Alcohol Use: No    FAMILY HISTORY:   Family History  Problem Relation Age of Onset  . Hernia Mother   . Heart disease Mother   . OCD Mother   . Diabetes Mother   . Parkinson's disease Father   . Bipolar disorder Sister   . Schizophrenia Sister   . ADD / ADHD Sister   . Alcohol abuse Brother   . Bipolar disorder Sister   . Paranoid behavior Sister   . ADD / ADHD Sister     DRUG ALLERGIES:    Allergies  Allergen Reactions  . Codeine Itching    REVIEW OF SYSTEMS:  REVIEW OF SYSTEMS:  CONSTITUTIONAL: Positive fevers, chills, fatigue, weakness.  EYES: Denies blurred vision, double vision, or eye pain.  EARS, NOSE, THROAT: Denies tinnitus, ear pain, hearing loss.  RESPIRATORY: Positive cough, shortness of breath, denies wheezing  CARDIOVASCULAR: Denies chest pain, palpitations, edema.  GASTROINTESTINAL: Denies nausea, vomiting, diarrhea, abdominal pain.  GENITOURINARY: Denies dysuria, hematuria.  ENDOCRINE: Denies nocturia or thyroid problems. HEMATOLOGIC AND LYMPHATIC: Denies easy bruising or bleeding.  SKIN: Denies rash or lesions.  MUSCULOSKELETAL: Denies pain in neck, back, shoulder, knees, hips, or further arthritic symptoms.  NEUROLOGIC: Denies paralysis, paresthesias.  PSYCHIATRIC: Denies anxiety or depressive symptoms. Otherwise full review of systems performed by me is negative.   MEDICATIONS AT HOME:   Prior to Admission medications   Medication Sig Start Date End Date Taking? Authorizing Provider  albuterol (PROVENTIL HFA;VENTOLIN HFA) 108 (90 BASE) MCG/ACT inhaler Inhale 1-2 puffs into the lungs every 6 (six) hours as needed for wheezing or shortness of breath. 02/08/15  Yes Bobetta Lime, MD  ALPRAZolam Duanne Moron) 1 MG tablet Take 1 tablet (1 mg total) by mouth 4 (four) times daily as needed for anxiety. 01/13/15  Yes Marjie Skiff, MD  cyclobenzaprine (FLEXERIL) 5 MG tablet Take 2  tablets (10 mg total) by mouth 3 (three) times daily as needed for muscle spasms. 11/24/14  Yes Lance Bosch, MD  divalproex (DEPAKOTE) 250 MG DR tablet Take 1 tablet (250 mg total) by mouth 2 (two) times daily. 02/03/15  Yes Marjie Skiff, MD  escitalopram (LEXAPRO) 20 MG tablet Take 1 tablet (20 mg total) by mouth daily. 01/13/15  Yes Marjie Skiff, MD  lidocaine (XYLOCAINE) 5 % ointment Apply 1 application topically as needed for mild pain.   Yes Historical Provider, MD   LYRICA 200 MG capsule Take 1 capsule (200 mg total) by mouth 2 (two) times daily. 10/04/14  Yes Bobetta Lime, MD  meloxicam (MOBIC) 15 MG tablet Take 1 tablet (15 mg total) by mouth daily. 02/13/15  Yes Charline Bills Cuthriell, PA-C  Misc Natural Products (OSTEO BI-FLEX ADV TRIPLE ST) TABS Take 1 tablet by mouth 2 (two) times daily.   Yes Historical Provider, MD  Multiple Vitamin (MULTIVITAMIN WITH MINERALS) TABS tablet Take 1 tablet by mouth daily.   Yes Historical Provider, MD  naproxen (NAPROSYN) 500 MG tablet Take 1 tablet (500 mg total) by mouth 2 (two) times daily with a meal. 01/26/15  Yes Johnn Hai, PA-C  QUEtiapine (SEROQUEL XR) 300 MG 24 hr tablet Take 1 tablet (300 mg total) by mouth every evening. 01/13/15  Yes Marjie Skiff, MD  traMADol (ULTRAM) 50 MG tablet Take 1 tablet (50 mg total) by mouth every 8 (eight) hours as needed. 02/16/15  Yes Bobetta Lime, MD  traZODone (DESYREL) 100 MG tablet Take 1 tablet (100 mg total) by mouth 2 (two) times daily. 01/12/15  Yes Lance Bosch, MD      VITAL SIGNS:  Blood pressure 106/56, pulse 121, temperature 101.3 F (38.5 C), temperature source Oral, resp. rate 26, height 5\' 1"  (1.549 m), weight 261 lb (118.389 kg), SpO2 93 %.  PHYSICAL EXAMINATION:  VITAL SIGNS: Filed Vitals:   03/02/15 0015 03/02/15 0100  BP:  106/56  Pulse: 122 121  Temp:    Resp: 31 50   GENERAL:54 y.o.female currently ill-appearing HEAD: Normocephalic, atraumatic.  EYES: Pupils equal, round, reactive to light. Extraocular muscles intact. No scleral icterus.  MOUTH: Moist mucosal membrane. Dentition intact. No abscess noted.  EAR, NOSE, THROAT: Clear without exudates. No external lesions.  NECK: Supple. No thyromegaly. No nodules. No JVD.  PULMONARY: Diffuse coarse rhonchi with diminished breath sounds, tachypneic No use of accessory muscles, Good respiratory effort. Poor air entry bilaterally CHEST: Nontender to palpation.  CARDIOVASCULAR: S1 and  S2. Tachycardic. No murmurs, rubs, or gallops. No edema. Pedal pulses 2+ bilaterally.  GASTROINTESTINAL: Soft, nontender, nondistended. No masses. Positive bowel sounds. No hepatosplenomegaly.  MUSCULOSKELETAL: No swelling, clubbing, or edema. Range of motion full in all extremities.  NEUROLOGIC: Cranial nerves II through XII are intact. No gross focal neurological deficits. Sensation intact. Reflexes intact.  SKIN: No ulceration, lesions, rashes, or cyanosis. Skin warm and dry. Turgor intact.  PSYCHIATRIC: Mood, affect within normal limits. The patient is awake, alert and oriented x 3. Insight, judgment intact.    LABORATORY PANEL:   CBC  Recent Labs Lab 03/01/15 2327  WBC 10.3  HGB 13.2  HCT 39.6  PLT 162   ------------------------------------------------------------------------------------------------------------------  Chemistries   Recent Labs Lab 03/01/15 2327  NA 136  K 5.0  CL 99*  CO2 26  GLUCOSE 130*  BUN 12  CREATININE 0.81  0.87  CALCIUM 8.6*  AST 413*  ALT 90*  ALKPHOS 75  BILITOT  1.8*   ------------------------------------------------------------------------------------------------------------------  Cardiac Enzymes  Recent Labs Lab 03/01/15 2327  TROPONINI <0.03   ------------------------------------------------------------------------------------------------------------------  RADIOLOGY:  Dg Chest Port 1 View  03/02/2015  CLINICAL DATA:  Respiratory distress for 4 days. Hypoxia. Wheezing. EXAM: PORTABLE CHEST 1 VIEW COMPARISON:  Radiographs and CT 11/05/2014 FINDINGS: Patchy and confluent airspace opacity at the right lung base. Left lung is clear. Cardiomediastinal contours are unchanged. Questionable blunting of right costophrenic angle. No pneumothorax. Soft tissue attenuation from body habitus limits detailed assessment. IMPRESSION: Patchy and confluent airspace opacity at the right lung base, concerning for pneumonia. Followup PA and lateral  chest X-ray is recommended in 3-4 weeks following trial of antibiotic therapy to ensure resolution and exclude underlying malignancy. Electronically Signed   By: Jeb Levering M.D.   On: 03/02/2015 00:18    EKG:   Orders placed or performed during the hospital encounter of 03/01/15  . EKG test  . EKG test  . EKG 12-Lead  . EKG 12-Lead    IMPRESSION AND PLAN:   [name] history of COPD non-oxygen dependent presenting with shortness of breath cough  1.Sepsis, meeting septic criteria by temperature, heart rate present on arrival. Source community-acquired pneumonia Panculture. Broad-spectrum antibiotics including azithromycin/ceftriaxone and taper antibiotics when culture data returns.  Continue IV fluid hydration to keep mean arterial pressure greater than 65. He may require pressor therapy if blood pressure worsens. We will repeat lactic acid given the initial is greater than 2.2.  2. COPD not acute exacerbation: Submental oxygen continue home medications 3. Seizure disorder Depakote 4. Hyperkalemia: IV fluid hydration follow potassium level V. Elevated LFTs: Check right upper quadrant ultrasound 6. Venous thromboembolism prophylactic: Heparin subcutaneous     All the records are reviewed and case discussed with ED provider. Management plans discussed with the patient, family and they are in agreement.  CODE STATUS: Full  TOTAL TIME TAKING CARE OF THIS PATIENT: 45 minutes.    Hower,  Karenann Cai.D on 03/02/2015 at 1:54 AM  Between 7am to 6pm - Pager - [phone]  After 6pm: House Pager: - [phone]  [facility]  Office  [phone]  CC: Primary care physician; Bobetta Lime, MD

## 2015-03-02 NOTE — Progress Notes (Signed)
PHARMACIST - PHYSICIAN ORDER COMMUNICATION  CONCERNING: P&T Medication Policy on Herbal Medications  DESCRIPTION:  This patient's order for:  Osteo Bi-flex Adv Triple  has been noted.  This product(s) is classified as an "herbal" or natural product. Due to a lack of definitive safety studies or FDA approval, nonstandard manufacturing practices, plus the potential risk of unknown drug-drug interactions while on inpatient medications, the Pharmacy and Therapeutics Committee does not permit the use of "herbal" or natural products of this type within [facility].   ACTION TAKEN: The pharmacy department is unable to verify this order at this time  Please reevaluate patient's clinical condition at discharge and address if the herbal or natural product(s) should be resumed at that time.

## 2015-03-02 NOTE — Progress Notes (Signed)
[place] at [place] NAME: Nicole Austin    MR#:  [id number]  DATE OF BIRTH:  Jan 04, 1961  SUBJECTIVE:   Patient here due to shortness of breath, wheezing and noted to be in acute respiratory failure. Also noted to have sepsis secondary to pneumonia. Patient quite lethargic/encephalopathic today. Responds to verbal stimuli but falls back asleep.  REVIEW OF SYSTEMS:    Review of Systems  Constitutional: Positive for fever. Negative for chills.  HENT: Negative for congestion and tinnitus.   Eyes: Negative for blurred vision and double vision.  Respiratory: Positive for cough and wheezing. Negative for shortness of breath.   Cardiovascular: Negative for chest pain, orthopnea and PND.  Gastrointestinal: Negative for nausea, vomiting, abdominal pain and diarrhea.  Genitourinary: Negative for dysuria and hematuria.  Neurological: Negative for dizziness, sensory change and focal weakness.  All other systems reviewed and are negative.   Nutrition: Heart healthy Tolerating Diet: Yes Tolerating PT: Await evaluation  DRUG ALLERGIES:   Allergies  Allergen Reactions  . Codeine Itching    VITALS:  Blood pressure 122/63, pulse 95, temperature 99.1 F (37.3 C), temperature source Oral, resp. rate 18, height 5\' 1"  (1.549 m), weight 118.389 kg (261 lb), SpO2 93 %.  PHYSICAL EXAMINATION:   Physical Exam  GENERAL:  55 y.o.-year-old obese patient lying in the bed lethargic/encephalopathic EYES: Pupils equal, round, reactive to light and accommodation. No scleral icterus. Extraocular muscles intact.  HEENT: Head atraumatic, normocephalic. Oropharynx and nasopharynx clear.  NECK:  Supple, no jugular venous distention. No thyroid enlargement, no tenderness.  LUNGS: Diffuse rhonchi/wheezing bilaterally. Good air entry bilaterally. No use of accessory muscles of respiration.  CARDIOVASCULAR: S1, S2 normal. No murmurs, rubs, or gallops.  ABDOMEN:  Soft, nontender, nondistended. Bowel sounds present. No organomegaly or mass.  EXTREMITIES: No cyanosis, clubbing or edema b/l.    NEUROLOGIC: Cranial nerves II through XII are intact. No focal Motor or sensory deficits b/l. Globally weak  PSYCHIATRIC: The patient is alert and oriented x 1.  SKIN: No obvious rash, lesion, or ulcer.    LABORATORY PANEL:   CBC  Recent Labs Lab 03/02/15 0548  WBC 10.5  HGB 12.4  HCT 36.7  PLT 172   ------------------------------------------------------------------------------------------------------------------  Chemistries   Recent Labs Lab 03/01/15 2327 03/02/15 0548  NA 136 136  K 5.0 4.0  CL 99* 100*  CO2 26 30  GLUCOSE 130* 133*  BUN 12 11  CREATININE 0.81  0.87 0.83  CALCIUM 8.6* 8.2*  AST 413*  --   ALT 90*  --   ALKPHOS 75  --   BILITOT 1.8*  --    ------------------------------------------------------------------------------------------------------------------  Cardiac Enzymes  Recent Labs Lab 03/01/15 2327  TROPONINI <0.03   ------------------------------------------------------------------------------------------------------------------  RADIOLOGY:  Dg Chest Port 1 View  03/02/2015  CLINICAL DATA:  Respiratory distress for 4 days. Hypoxia. Wheezing. EXAM: PORTABLE CHEST 1 VIEW COMPARISON:  Radiographs and CT 11/05/2014 FINDINGS: Patchy and confluent airspace opacity at the right lung base. Left lung is clear. Cardiomediastinal contours are unchanged. Questionable blunting of right costophrenic angle. No pneumothorax. Soft tissue attenuation from body habitus limits detailed assessment. IMPRESSION: Patchy and confluent airspace opacity at the right lung base, concerning for pneumonia. Followup PA and lateral chest X-ray is recommended in 3-4 weeks following trial of antibiotic therapy to ensure resolution and exclude underlying malignancy. Electronically Signed   By: Jeb Levering M.D.   On: 03/02/2015 00:18      ASSESSMENT  AND PLAN:   55 year old female with past medical history of COPD, PTSD, bipolar disorder, history of previous CVA, depression, ADHD, who presented to the hospital due to shortness of breath, wheezing and noted to be in acute respiratory failure.  #1 acute respiratory failure with hypoxia-due to COPD exacerbation with pneumonia. -Continue O2 supplementation, continue care for treatment for COPD with pneumonia with IV steroids, DuoNeb's, IV antibiotics and follow clinically.  #2 COPD exacerbation-due to pneumonia.  -I will start the patient on IV steroids, continue ceftriaxone and Zithromax for the pneumonia. -Continue duo nebs.  #3 pneumonia-community acquired. -Continue ceftriaxone, Zithromax.  Follow blood/sputum cultures  #4 altered mental status/encephalopathy-etiology unclear. Possible polypharmacy. -ABG obtained shows no evidence of CO2 retention. -Avoid sedative meds until mental status improves. Patient is on high doses of Lyrica, trazodone, Seroquel. May need to get psychiatric consult to adjust her meds prior to discharge.  #5 history of bipolar disorder-continue Seroquel, Depakote, Lexapro  #6 anxiety-continue Xanax as needed.  #7 osteoarthritis-continue meloxicam.  #8 neuropathy-continue Lyrica  #9 Sepsis - pt. Ruled on admission due to fever, tachycardia, CXR + for pneumonia.  - cont. IV fluids, IV abx (Ceftriaxone, Zithromax), tylenol.  Follow fever curve and cultures.    All the records are reviewed and case discussed with Care Management/Social Workerr. Management plans discussed with the patient, family and they are in agreement.  CODE STATUS: full  DVT Prophylaxis: Lovenox  TOTAL TIME TAKING CARE OF THIS PATIENT: 30 minutes.   POSSIBLE D/C IN 2-3 DAYS, DEPENDING ON CLINICAL CONDITION.   Henreitta Leber M.D on 03/02/2015 at 3:16 PM  Between 7am to 6pm - Pager - [phone]  After 6pm go to [email or web address] - password [email or web address] [facility]  Office  [phone]  CC: Primary care physician; Bobetta Lime, MD

## 2015-03-03 DIAGNOSIS — F319 Bipolar disorder, unspecified: Secondary | ICD-10-CM

## 2015-03-03 LAB — BASIC METABOLIC PANEL
ANION GAP: 8 (ref 5–15)
BUN: 12 mg/dL (ref 6–20)
CALCIUM: 8.3 mg/dL — AB (ref 8.9–10.3)
CO2: 28 mmol/L (ref 22–32)
Chloride: 103 mmol/L (ref 101–111)
Creatinine, Ser: 0.76 mg/dL (ref 0.44–1.00)
GFR calc Af Amer: >60 mL/min (ref >60)
GLUCOSE: 150 mg/dL — AB (ref 65–99)
Potassium: 4.2 mmol/L (ref 3.5–5.1)
SODIUM: 139 mmol/L (ref 135–145)

## 2015-03-03 LAB — CBC
HCT: 38.8 % (ref 35.0–47.0)
Hemoglobin: 12.9 g/dL (ref 12.0–16.0)
MCH: 30 pg (ref 26.0–34.0)
MCHC: 33.2 g/dL (ref 32.0–36.0)
MCV: 90.5 fL (ref 80.0–100.0)
PLATELETS: 186 10*3/uL (ref 150–440)
RBC: 4.29 MIL/uL (ref 3.80–5.20)
RDW: 15 % — AB (ref 11.5–14.5)
WBC: 9.9 10*3/uL (ref 3.6–11.0)

## 2015-03-03 MED ORDER — BUDESONIDE 0.25 MG/2ML IN SUSP
0.2500 mg | Freq: Two times a day (BID) | RESPIRATORY_TRACT | Status: DC
  Administered 2015-03-03 – 2015-03-08 (×10): 0.25 mg via RESPIRATORY_TRACT
  Filled 2015-03-03 (×10): qty 2

## 2015-03-03 NOTE — Progress Notes (Signed)
[place] at [place] NAME: Nicole Austin    MR#:  [id number]  DATE OF BIRTH:  Nov 30, 1960  SUBJECTIVE:   Patient here due to shortness of breath, wheezing and noted to be in acute respiratory failure. Also noted to have sepsis secondary to pneumonia. Much more awake and alert today.  Tearful at times as she is still grieving from her mother's death last year.    REVIEW OF SYSTEMS:    Review of Systems  Constitutional: Positive for fever. Negative for chills.  HENT: Negative for congestion and tinnitus.   Eyes: Negative for blurred vision and double vision.  Respiratory: Positive for cough and wheezing. Negative for shortness of breath.   Cardiovascular: Negative for chest pain, orthopnea and PND.  Gastrointestinal: Negative for nausea, vomiting, abdominal pain and diarrhea.  Genitourinary: Negative for dysuria and hematuria.  Neurological: Negative for dizziness, sensory change and focal weakness.  Psychiatric/Behavioral: Positive for depression.  All other systems reviewed and are negative.   Nutrition: Heart healthy Tolerating Diet: Yes Tolerating PT: Await evaluation  DRUG ALLERGIES:   Allergies  Allergen Reactions  . Codeine Itching    VITALS:  Blood pressure 133/75, pulse 87, temperature 98.5 F (36.9 C), temperature source Oral, resp. rate 18, height 5\' 1"  (1.549 m), weight 118.389 kg (261 lb), SpO2 94 %.  PHYSICAL EXAMINATION:   Physical Exam  GENERAL:  55 y.o.-year-old obese patient lying in the bed in NAD.  Tearful at times.  EYES: Pupils equal, round, reactive to light and accommodation. No scleral icterus. Extraocular muscles intact.  HEENT: Head atraumatic, normocephalic. Oropharynx and nasopharynx clear.  NECK:  Supple, no jugular venous distention. No thyroid enlargement, no tenderness.  LUNGS: Diffuse rhonchi/wheezing bilaterally. Good air entry bilaterally. No use of accessory muscles of respiration.   CARDIOVASCULAR: S1, S2 normal. No murmurs, rubs, or gallops.  ABDOMEN: Soft, nontender, nondistended. Bowel sounds present. No organomegaly or mass.  EXTREMITIES: No cyanosis, clubbing or edema b/l.    NEUROLOGIC: Cranial nerves II through XII are intact. No focal Motor or sensory deficits b/l. Globally weak  PSYCHIATRIC: The patient is alert and oriented x 3. Depressed and Tearful SKIN: No obvious rash, lesion, or ulcer.    LABORATORY PANEL:   CBC  Recent Labs Lab 03/03/15 0511  WBC 9.9  HGB 12.9  HCT 38.8  PLT 186   ------------------------------------------------------------------------------------------------------------------  Chemistries   Recent Labs Lab 03/01/15 2327  03/03/15 0511  NA 136  < > 139  K 5.0  < > 4.2  CL 99*  < > 103  CO2 26  < > 28  GLUCOSE 130*  < > 150*  BUN 12  < > 12  CREATININE 0.81  0.87  < > 0.76  CALCIUM 8.6*  < > 8.3*  AST 413*  --   --   ALT 90*  --   --   ALKPHOS 75  --   --   BILITOT 1.8*  --   --   < > = values in this interval not displayed. ------------------------------------------------------------------------------------------------------------------  Cardiac Enzymes  Recent Labs Lab 03/01/15 2327  TROPONINI <0.03   ------------------------------------------------------------------------------------------------------------------  RADIOLOGY:  Dg Chest Port 1 View  03/02/2015  CLINICAL DATA:  Respiratory distress for 4 days. Hypoxia. Wheezing. EXAM: PORTABLE CHEST 1 VIEW COMPARISON:  Radiographs and CT 11/05/2014 FINDINGS: Patchy and confluent airspace opacity at the right lung base. Left lung is clear. Cardiomediastinal contours are unchanged. Questionable blunting of right costophrenic  angle. No pneumothorax. Soft tissue attenuation from body habitus limits detailed assessment. IMPRESSION: Patchy and confluent airspace opacity at the right lung base, concerning for pneumonia. Followup PA and lateral chest X-ray is  recommended in 3-4 weeks following trial of antibiotic therapy to ensure resolution and exclude underlying malignancy. Electronically Signed   By: Jeb Levering M.D.   On: 03/02/2015 00:18     ASSESSMENT AND PLAN:   55 year old female with past medical history of COPD, PTSD, bipolar disorder, history of previous CVA, depression, ADHD, who presented to the hospital due to shortness of breath, wheezing and noted to be in acute respiratory failure.  #1 acute respiratory failure with hypoxia-due to COPD exacerbation with pneumonia. -Continue O2 supplementation, continue treatment for COPD with pneumonia with IV steroids, DuoNeb's, IV ceftriaxone and Zithromax. -Improving and will wean O2 as tolerated.  #2 COPD exacerbation-due to pneumonia.  -Continue IV steroids, I will start Pulmicort nebs, continue ceftriaxone and Zithromax for the pneumonia. -Continue duo nebs PRN - assess for Home O2 prior to discharge.   #3 pneumonia-community acquired. -Continue ceftriaxone, Zithromax.   - afebrile overnight.   #4 altered mental status/encephalopathy-etiology unclear. Possible polypharmacy. -ABG obtained yesterday showed no evidence of CO2 retention. -Patient is on high doses of Lyrica, trazodone, Seroquel. Will get Psych consult today to help with meds.   #5 history of bipolar disorder/Depression-continue Seroquel, Depakote, Lexapro - pt. Is tearful at times and still grieving from her mother's death last year. I will get a psychiatric consult to help with medical management.  #6 anxiety-continue Xanax as needed.  #7 osteoarthritis-continue meloxicam.  #8 neuropathy-continue Lyrica  #9 Sepsis - pt. Ruled in admission due to fever, tachycardia, CXR + for pneumonia.  - afebrile overnight, hemodynamically stable. Will d/c IV fluids.  - cont. IV abx (Ceftriaxone, Zithromax). Follow fever curve and cultures.   PT eval to assess mobility.   All the records are reviewed and case discussed with  Care Management/Social Workerr. Management plans discussed with the patient, family and they are in agreement.  CODE STATUS: full  DVT Prophylaxis: Lovenox  TOTAL TIME TAKING CARE OF THIS PATIENT: 30 minutes.   POSSIBLE D/C IN 2-3 DAYS, DEPENDING ON CLINICAL CONDITION.   Henreitta Leber M.D on 03/03/2015 at 3:20 PM  Between 7am to 6pm - Pager - [phone]  After 6pm go to [email or web address] - password [email or web address] [facility]  Office  [phone]  CC: Primary care physician; Bobetta Lime, MD

## 2015-03-03 NOTE — Care Management Note (Signed)
Case Management Note  Patient Details  Name: Nicole Austin MRN: [id number] Date of Birth: 08-Mar-1960  Subjective/Objective:              Patient admitted from home with acute respiratory failure with hypoxia.  Patient lives at home with a room mate.  Patient obtains her medications from [facility], which delivers her medications to her home.  Patient states that she has recently been approved for section 8 housing, and is concerned that she will miss the opportunity since she is in the hospital.  Patient is currently requiring acute O2, and would need qualifying sats and diagnosis at time of discharge if needed.  Patient uses [facility] transportation.  Patient states that she has CNA services through [facility] [employer] that come 5 days a week. Patient is very tearful during my assessment, especially when she speaks about the death of her mother in Nov 17, 2022.  Patient does have a sister who has reported concerns of that patient's living conditions. A psych consult has been placed.     Action/Plan:   Expected Discharge Date:                  Expected Discharge Plan:     In-House Referral:     Discharge planning Services     Post Acute Care Choice:    Choice offered to:     DME Arranged:    DME Agency:     HH Arranged:    [place] Agency:     Status of Service:     Medicare Important Message Given:    Date Medicare IM Given:    Medicare IM give by:    Date Additional Medicare IM Given:    Additional Medicare Important Message give by:     If discussed at [place] of Stay Meetings, dates discussed:    Additional Comments:  Beverly Sessions, RN 03/03/2015, 11:46 AM

## 2015-03-03 NOTE — Consult Note (Signed)
[facility] Consult   Reason for Consult:  Consult for this 55 year old woman with a history of bipolar disorder currently in the hospital for pneumonia and sepsis. Concern about depression Referring Physician:  Verdell Carmine Patient Identification: Nicole Austin MRN:  [id number] Principal Diagnosis: Bipolar I disorder [facility]) Diagnosis:   Patient Active Problem List   Diagnosis Date Noted  . Bipolar I disorder ([place]) [F31.9] 03/03/2015  . Sepsis ([place]) [A41.9] 03/02/2015  . Left hip pain [M25.552] 01/31/2015  . Bipolar I disorder, most recent episode depressed ([place]) [F31.30] 12/22/2014  . Morbid obesity ([place]) [E66.01] 12/22/2014  . Rash and nonspecific skin eruption [R21] 11/16/2014  . Burn of finger [T23.029A] 09/23/2014  . Anxiety [F41.9] 08/16/2014  . Affective bipolar disorder ([place]) [F31.9] 08/16/2014  . Gout [M10.9] 08/16/2014  . H/O gastrointestinal disease [Z87.19] 08/16/2014  . Extreme obesity ([place]) [E66.01] 08/16/2014  . Muscle spasms of both lower extremities [M62.838] 08/16/2014  . Back pain, chronic [M54.9, G89.29] 08/13/2014  . Low back pain with sciatica [M54.40] 08/04/2014  . Compulsive tobacco user syndrome [F17.200] 08/04/2014  . Current tobacco use [Z72.0] 08/04/2014  . Polysubstance abuse [F19.10] 07/04/2014  . Anxiety, generalized [F41.1] 11/24/2013  . Imbalance [R26.89] 11/24/2013  . Blurred vision [H53.8] 11/24/2013  . Cephalalgia [R51] 11/24/2013  . COPD, moderate ([place]) [J44.9] 11/18/2013  . Moderate COPD (chronic obstructive pulmonary disease) ([place]) [J44.9] 11/18/2013  . HPV (human papilloma virus) infection [B97.7] 07/17/2013  . Arthritis of knee, degenerative [M17.9] 07/15/2013  . H/O neoplasm [Z87.898] 07/31/2011    Total Time spent with patient: 1 hour  Subjective:   Nicole Austin is a 55 y.o. female patient admitted with "I still get weepy when you talk about my mother".  HPI:  Patient interviewed. Chart reviewed. Old  psychiatric Notes reviewed. Labs reviewed. This a 55 year old woman with a history of bipolar disorder who is currently in the hospital for pneumonia and sepsis. Patient states that today she is feeling much better. Yesterday she admits that she was feeling very confused which she blames on being sick. She says that she was sick for several days before she came into the hospital with fevers and confusion to the point where apparently she wasn't even able to take her medicines normally. Today she says her mood is starting to feel back to normal. Most of the time she feels fairly stable. When she thinks about or someone mentions her mother she still gets upset and tearful but she has good insight into it and is able to keep that under control. Patient says she hadn't been sleeping very well recently. She denies having any hallucinations. Denies any suicidal intent or plan. She admits that over the holidays she had some passing suicidal thoughts related to her missing her mother but never seriously considered acting on it. She is seen by Dr. Jimmye Norman in the outpatient office and is maintained on Seroquel, Lexapro, Depakote and Xanax all of which has been restarted. Her Payton Mccallum  Social history: Patient had been living by herself but it sounds like she has made an agreement with a friend of hers that the 2 of them are going to split an apartment going forward and they are looking for a section 8 house which will save them some money and also give her a companion and someone to help take care of her. Patient's mother passed away in 12/08/2022 of this year and she still gets upset about it. Sounds like she maintains good relationship with her siblings.  Medical history: Patient has COPD, history of pneumonia, chronic low back pain, gout. She is currently in the hospital with acute infection that she is starting to recover from an a worsening of her COPD.  Substance abuse history: Patient continues to smoke up until when  she came into the hospital but she denies use of alcohol or other drugs and there is not a past history of substance abuse identified  Past Psychiatric History: Patient has a history of bipolar disorder. She is currently seen by Dr. Jimmye Norman. She says that she does get periods of time were her mood gets elevated but it doesn't sound like she's ever had a full psychotic mania. Denies any history of hospitalization. She does have a past history of self cutting and suicide attempts but it's been years ago. She finds her current medicine to be helpful and stable.  Risk to Self: Is patient at risk for suicide?: No Risk to Others:   Prior Inpatient Therapy:   Prior Outpatient Therapy:    Past Medical History:  Past Medical History  Diagnosis Date  . COPD (chronic obstructive pulmonary disease) ([place])   . PTSD (post-traumatic stress disorder)   . Rhabdomyolysis   . Bipolar 1 disorder ([place])   . Arthritis   . Stroke ([place])   . MI (myocardial infarction) ([place])   . Depression   . ADHD (attention deficit hyperactivity disorder)   . Asthma     Past Surgical History  Procedure Laterality Date  . Replacement total knee Left    Family History:  Family History  Problem Relation Age of Onset  . Hernia Mother   . Heart disease Mother   . OCD Mother   . Diabetes Mother   . Parkinson's disease Father   . Bipolar disorder Sister   . Schizophrenia Sister   . ADD / ADHD Sister   . Alcohol abuse Brother   . Bipolar disorder Sister   . Paranoid behavior Sister   . ADD / ADHD Sister    Family Psychiatric  History: Patient says she has a sister who probably has social anxiety disorder Social History:  History  Alcohol Use No     History  Drug Use No    Social History   Social History  . Marital Status: Divorced    Spouse Name: N/A  . Number of Children: N/A  . Years of Education: N/A   Social History Main Topics  . Smoking status: Current Every Day Smoker -- 0.50 packs/day    Types:  Cigarettes    Start date: 10/11/1984  . Smokeless tobacco: Never Used  . Alcohol Use: No  . Drug Use: No  . Sexual Activity: No   Other Topics Concern  . None   Social History Narrative   Additional Social History:                          Allergies:   Allergies  Allergen Reactions  . Codeine Itching    Labs:  Results for orders placed or performed during the hospital encounter of 03/01/15 (from the past 48 hour(s))  Comprehensive metabolic panel     Status: Abnormal   Collection Time: 03/01/15 11:27 PM  Result Value Ref Range   Sodium 136 135 - 145 mmol/L   Potassium 5.0 3.5 - 5.1 mmol/L    Comment: HEMOLYSIS AT THIS LEVEL MAY AFFECT RESULT   Chloride 99 (L) 101 - 111 mmol/L   CO2 26 22 -  32 mmol/L   Glucose, Bld 130 (H) 65 - 99 mg/dL   BUN 12 6 - 20 mg/dL   Creatinine, Ser 0.87 0.44 - 1.00 mg/dL   Calcium 8.6 (L) 8.9 - 10.3 mg/dL   Total Protein 6.9 6.5 - 8.1 g/dL   Albumin 3.3 (L) 3.5 - 5.0 g/dL   AST 413 (H) 15 - 41 U/L    Comment: HEMOLYSIS AT THIS LEVEL MAY AFFECT RESULT   ALT 90 (H) 14 - 54 U/L    Comment: HEMOLYSIS AT THIS LEVEL MAY AFFECT RESULT   Alkaline Phosphatase 75 38 - 126 U/L   Total Bilirubin 1.8 (H) 0.3 - 1.2 mg/dL    Comment: HEMOLYSIS AT THIS LEVEL MAY AFFECT RESULT   GFR calc non Af Amer >60 >60 mL/min   GFR calc Af Amer >60 >60 mL/min    Comment: (NOTE) The eGFR has been calculated using the CKD EPI equation. This calculation has not been validated in all clinical situations. eGFR's persistently <60 mL/min signify possible Chronic Kidney Disease.    Anion gap 11 5 - 15  Troponin I     Status: None   Collection Time: 03/01/15 11:27 PM  Result Value Ref Range   Troponin I <0.03 <0.031 ng/mL    Comment:        NO INDICATION OF MYOCARDIAL INJURY.   Lactic acid, plasma     Status: Abnormal   Collection Time: 03/01/15 11:27 PM  Result Value Ref Range   Lactic Acid, Venous 2.2 (HH) 0.5 - 2.0 mmol/L    Comment: CRITICAL RESULT  CALLED TO, READ BACK BY AND VERIFIED WITH [facility] AT 0107 03/02/15 MLZ   CBC     Status: Abnormal   Collection Time: 03/01/15 11:27 PM  Result Value Ref Range   WBC 10.3 3.6 - 11.0 K/uL   RBC 4.39 3.80 - 5.20 MIL/uL   Hemoglobin 13.2 12.0 - 16.0 g/dL   HCT 39.6 35.0 - 47.0 %   MCV 90.1 80.0 - 100.0 fL   MCH 30.0 26.0 - 34.0 pg   MCHC 33.3 32.0 - 36.0 g/dL   RDW 14.7 (H) 11.5 - 14.5 %   Platelets 162 150 - 440 K/uL  Creatinine, serum     Status: None   Collection Time: 03/01/15 11:27 PM  Result Value Ref Range   Creatinine, Ser 0.81 0.44 - 1.00 mg/dL   GFR calc non Af Amer >60 >60 mL/min   GFR calc Af Amer >60 >60 mL/min    Comment: (NOTE) The eGFR has been calculated using the CKD EPI equation. This calculation has not been validated in all clinical situations. eGFR's persistently <60 mL/min signify possible Chronic Kidney Disease.   Brain natriuretic peptide     Status: Abnormal   Collection Time: 03/02/15  1:28 AM  Result Value Ref Range   B Natriuretic Peptide 159.0 (H) 0.0 - 100.0 pg/mL  Lactic acid, plasma     Status: None   Collection Time: 03/02/15  5:48 AM  Result Value Ref Range   Lactic Acid, Venous 2.0 0.5 - 2.0 mmol/L  Basic metabolic panel     Status: Abnormal   Collection Time: 03/02/15  5:48 AM  Result Value Ref Range   Sodium 136 135 - 145 mmol/L   Potassium 4.0 3.5 - 5.1 mmol/L   Chloride 100 (L) 101 - 111 mmol/L   CO2 30 22 - 32 mmol/L   Glucose, Bld 133 (H) 65 - 99 mg/dL  BUN 11 6 - 20 mg/dL   Creatinine, Ser 0.83 0.44 - 1.00 mg/dL   Calcium 8.2 (L) 8.9 - 10.3 mg/dL   GFR calc non Af Amer >60 >60 mL/min   GFR calc Af Amer >60 >60 mL/min    Comment: (NOTE) The eGFR has been calculated using the CKD EPI equation. This calculation has not been validated in all clinical situations. eGFR's persistently <60 mL/min signify possible Chronic Kidney Disease.    Anion gap 6 5 - 15  CBC     Status: Abnormal   Collection Time: 03/02/15  5:48 AM   Result Value Ref Range   WBC 10.5 3.6 - 11.0 K/uL   RBC 4.10 3.80 - 5.20 MIL/uL   Hemoglobin 12.4 12.0 - 16.0 g/dL   HCT 36.7 35.0 - 47.0 %   MCV 89.7 80.0 - 100.0 fL   MCH 30.2 26.0 - 34.0 pg   MCHC 33.7 32.0 - 36.0 g/dL   RDW 15.0 (H) 11.5 - 14.5 %   Platelets 172 150 - 440 K/uL  Blood gas, arterial     Status: Abnormal   Collection Time: 03/02/15 12:32 PM  Result Value Ref Range   FIO2 34.00    Delivery systems NASAL CANNULA    pH, Arterial 7.45 7.350 - 7.450   pCO2 arterial 49 (H) 32.0 - 48.0 mmHg   pO2, Arterial 61 (L) 83.0 - 108.0 mmHg   Bicarbonate 34.1 (H) 21.0 - 28.0 mEq/L   Acid-Base Excess 8.7 (H) 0.0 - 3.0 mmol/L   O2 Saturation 92.1 %   Patient temperature 37.0    Collection site RIGHT RADIAL    Sample type ARTERIAL DRAW    Allens test (pass/fail) POSITIVE (A) PASS  CBC     Status: Abnormal   Collection Time: 03/03/15  5:11 AM  Result Value Ref Range   WBC 9.9 3.6 - 11.0 K/uL   RBC 4.29 3.80 - 5.20 MIL/uL   Hemoglobin 12.9 12.0 - 16.0 g/dL   HCT 38.8 35.0 - 47.0 %   MCV 90.5 80.0 - 100.0 fL   MCH 30.0 26.0 - 34.0 pg   MCHC 33.2 32.0 - 36.0 g/dL   RDW 15.0 (H) 11.5 - 14.5 %   Platelets 186 150 - 440 K/uL  Basic metabolic panel     Status: Abnormal   Collection Time: 03/03/15  5:11 AM  Result Value Ref Range   Sodium 139 135 - 145 mmol/L   Potassium 4.2 3.5 - 5.1 mmol/L   Chloride 103 101 - 111 mmol/L   CO2 28 22 - 32 mmol/L   Glucose, Bld 150 (H) 65 - 99 mg/dL   BUN 12 6 - 20 mg/dL   Creatinine, Ser 0.76 0.44 - 1.00 mg/dL   Calcium 8.3 (L) 8.9 - 10.3 mg/dL   GFR calc non Af Amer >60 >60 mL/min   GFR calc Af Amer >60 >60 mL/min    Comment: (NOTE) The eGFR has been calculated using the CKD EPI equation. This calculation has not been validated in all clinical situations. eGFR's persistently <60 mL/min signify possible Chronic Kidney Disease.    Anion gap 8 5 - 15    Current Facility-Administered Medications  Medication Dose Route Frequency  Provider Last Rate Last Dose  . acetaminophen (TYLENOL) tablet 650 mg  650 mg Oral Q6H PRN Lytle Butte, MD   650 mg at 03/02/15 2841   Or  . acetaminophen (TYLENOL) suppository 650 mg  650 mg Rectal Q6H PRN  Lytle Butte, MD      . ALPRAZolam Duanne Moron) tablet 1 mg  1 mg Oral QID PRN Lytle Butte, MD   1 mg at 03/03/15 1418  . azithromycin (ZITHROMAX) 500 mg in dextrose 5 % 250 mL IVPB  500 mg Intravenous Q24H Lytle Butte, MD   500 mg at 03/03/15 1650  . budesonide (PULMICORT) nebulizer solution 0.25 mg  0.25 mg Nebulization BID Henreitta Leber, MD   0.25 mg at 03/03/15 1540  . cefTRIAXone (ROCEPHIN) 1 g in dextrose 5 % 50 mL IVPB  1 g Intravenous Q24H Lytle Butte, MD   1 g at 03/02/15 1740  . cyclobenzaprine (FLEXERIL) tablet 10 mg  10 mg Oral TID PRN Lytle Butte, MD      . divalproex (DEPAKOTE) DR tablet 250 mg  250 mg Oral BID Lytle Butte, MD   250 mg at 03/03/15 1034  . enoxaparin (LOVENOX) injection 40 mg  40 mg Subcutaneous Q12H Henreitta Leber, MD   40 mg at 03/03/15 1033  . escitalopram (LEXAPRO) tablet 20 mg  20 mg Oral Daily Lytle Butte, MD   20 mg at 03/03/15 1034  . ipratropium-albuterol (DUONEB) 0.5-2.5 (3) MG/3ML nebulizer solution 3 mL  3 mL Nebulization Q4H PRN Lytle Butte, MD   3 mL at 03/03/15 1540  . lidocaine (XYLOCAINE) 5 % ointment 1 application  1 application Topical PRN Lytle Butte, MD      . meloxicam [facility]) tablet 15 mg  15 mg Oral Daily Lytle Butte, MD   15 mg at 03/03/15 1034  . methylPREDNISolone sodium succinate (SOLU-MEDROL) 40 mg/mL injection 40 mg  40 mg Intravenous Q6H Henreitta Leber, MD   40 mg at 03/03/15 1418  . multivitamin with minerals tablet 1 tablet  1 tablet Oral Daily Lytle Butte, MD   1 tablet at 03/03/15 1034  . ondansetron (ZOFRAN) tablet 4 mg  4 mg Oral Q6H PRN Lytle Butte, MD       Or  . ondansetron [facility]) injection 4 mg  4 mg Intravenous Q6H PRN Lytle Butte, MD      . pregabalin (LYRICA) capsule 200 mg  200 mg Oral BID  Lytle Butte, MD   200 mg at 03/03/15 1033  . QUEtiapine (SEROQUEL XR) 24 hr tablet 300 mg  300 mg Oral QPM Lytle Butte, MD   Stopped at 03/02/15 1522  . senna-docusate (Senokot-S) tablet 2 tablet  2 tablet Oral BID Henreitta Leber, MD   2 tablet at 03/03/15 1033  . traMADol (ULTRAM) tablet 50 mg  50 mg Oral Q6H PRN Lytle Butte, MD        Musculoskeletal: Strength & Muscle Tone: within normal limits Gait & Station: unsteady Patient leans: N/A  Psychiatric Specialty Exam: Review of Systems  HENT: Negative.   Eyes: Negative.   Respiratory: Positive for cough and shortness of breath.   Cardiovascular: Negative.   Gastrointestinal: Negative.   Musculoskeletal: Negative.   Skin: Negative.   Neurological: Positive for weakness.  Psychiatric/Behavioral: Negative for depression, suicidal ideas, hallucinations, memory loss and substance abuse. The patient has insomnia. The patient is not nervous/anxious.     Blood pressure 142/69, pulse 94, temperature 98.5 F (36.9 C), temperature source Oral, resp. rate 18, height 5' 1"  (1.549 m), weight 118.389 kg (261 lb), SpO2 94 %.Body mass index is 49.34 kg/(m^2).  General Appearance: Casual  Eye Contact::  Good  Speech:  Normal Rate, Pressured and It's possible that it may be a little pressured although she is certainly interruptible and I would guess that it may not be too much different from her baseline.  Volume:  Increased  Mood:  Euthymic and There is a lability to her mood. She can quickly get tearful when discussing her mother but then pulls out of it equally quickly. For the most part she seems pretty jolly but not to a bizarre degree.  Affect:  Labile and Full Range  Thought Process:  Tangential  Orientation:  Full (Time, Place, and Person)  Thought Content:  Negative  Suicidal Thoughts:  No  Homicidal Thoughts:  No  Memory:  Immediate;   Good Recent;   Fair Remote;   Fair  Judgement:  Fair  Insight:  Fair  Psychomotor Activity:   Normal  Concentration:  Fair  Recall:  [employer] of Knowledge:Fair  Language: Fair  Akathisia:  No  Handed:  Right  AIMS (if indicated):     Assets:  Communication Skills Desire for Improvement Housing Resilience Social Support  ADL's:  Intact  Cognition: WNL  Sleep:      Treatment Plan Summary: Medication management and Plan 55 year old woman with a history of bipolar disorder. Based on my review of her history it sounds like it's probably more like a bipolar type II or 3. Patient currently seems to be a little bit pressured and labile but I don't know that it's that far off her baseline. She is not currently psychotic. It's certainly possible that some of this could be from the intravenous steroids as well. Patient herself says she feels pretty much at her normal baseline. She is very appropriate in understanding her medical problems and cooperative with that. She has been started back on her combination of medicine she was taking as an outpatient. I don't see any indication to make any changes to that. Supportive counseling. I will follow-up as needed.  Disposition: Patient does not meet criteria for psychiatric inpatient admission. Supportive therapy provided about ongoing stressors.  Aurelius Gildersleeve 03/03/2015 5:36 PM

## 2015-03-03 NOTE — Progress Notes (Signed)
Nicole Austin's family visited this evening and were very concerned about "her lifestyle, her over-use of her prescription medications, and her street drug use." They are concerned that "she is going to over-dose and kill herself." Family is wanting to know if there is anyway they can commit her, stated they have been in contact with Nicole Austin's case worker at [employer] [facility] [place], and states the case worker is willing to help as needed. Nursing informed family that the MD will be made aware of their concerns. Nursing continues to monitor.

## 2015-03-04 MED ORDER — AZITHROMYCIN 250 MG PO TABS
500.0000 mg | ORAL_TABLET | ORAL | Status: DC
  Administered 2015-03-04 – 2015-03-07 (×4): 500 mg via ORAL
  Filled 2015-03-04 (×4): qty 2

## 2015-03-04 MED ORDER — METHYLPREDNISOLONE SODIUM SUCC 40 MG IJ SOLR
40.0000 mg | Freq: Two times a day (BID) | INTRAMUSCULAR | Status: DC
  Administered 2015-03-04 – 2015-03-05 (×2): 40 mg via INTRAVENOUS
  Filled 2015-03-04 (×2): qty 1

## 2015-03-04 MED ORDER — LACTULOSE 10 GM/15ML PO SOLN
30.0000 g | Freq: Two times a day (BID) | ORAL | Status: DC
  Administered 2015-03-04 – 2015-03-05 (×3): 30 g via ORAL
  Filled 2015-03-04 (×3): qty 60

## 2015-03-04 NOTE — Evaluation (Signed)
Physical Therapy Evaluation Patient Details Name: Nicole Austin MRN: [id number] DOB: 10-09-1960 Today's Date: 03/04/2015   History of Present Illness  Pt here with prolonged cold-like symptoms and had O2 issues, she also is being seen for bipolar disorder  Clinical Impression  Pt very pleasant and eager to participate with PT.  Attempted some light bed activities on room air and her O2 dropped below 90, maintained 4 liters t/o the rest of the session.  Pt is able to ambulate slowly with no LOBs, but becomes very fatigued with the effort and generally is not able to push herself very much needing a standing rest break and cues for breathing.  Pt very much wants to go home but will need increased activity tolerance to safely do this.     Follow Up Recommendations SNF;Home health PT (per progress/O2 status, pt wants to go home)    Equipment Recommendations  None recommended by PT    Recommendations for Other Services Rehab consult     Precautions / Restrictions Precautions Precautions: Fall Precaution Comments: Pt has had 2 falls, reports she had throw rugs removed Restrictions Weight Bearing Restrictions: No      Mobility  Bed Mobility Overal bed mobility: Needs Assistance Bed Mobility: Supine to Sit;Sit to Supine     Supine to sit: Min assist Sit to supine: Min assist   General bed mobility comments: Pt needing heavy use of rails and extra time to get to sitting, only minimal assist with LEs  Transfers Overall transfer level: Modified independent Equipment used: Rolling walker (2 wheeled)             General transfer comment: Pt needing minimal HHA/walker use to maintain balance, but able to sit/rise w/o physical assist  Ambulation/Gait Ambulation/Gait assistance: Min assist Ambulation Distance (Feet): 30 Feet Assistive device: Rolling walker (2 wheeled)     Gait velocity interpretation: <1.8 ft/sec, indicative of risk for recurrent falls General Gait  Details: Pt with slow, guarded ambulation and she has signficant fatigue on 4 liters O2 with the effort.  Her sats remain in the 90s (drop from mid to low 90s) but she feels very tired and fatigued with the effort.   Stairs            Wheelchair Mobility    Modified Rankin (Stroke Patients Only)       Balance Overall balance assessment: Modified Independent                                           Pertinent Vitals/Pain Pain Assessment: No/denies pain    Home Living Family/patient expects to be discharged to:: Private residence Living Arrangements:  (reports she has a room mate)   Type of Home: Apartment Home Access: Level entry     Home Layout: One level Home Equipment: Cane - single point;Walker - 4 wheels;Walker - 2 wheels;Walker - standard      Prior Function Level of Independence: Needs assistance         Comments: Pt has needed ADs in the past, reports she had not needed any recently     Hand Dominance        Extremity/Trunk Assessment   Upper Extremity Assessment: Overall WFL for tasks assessed           Lower Extremity Assessment: Overall WFL for tasks assessed         Communication  Cognition Arousal/Alertness: Awake/alert Behavior During Therapy: Anxious Overall Cognitive Status: Within Functional Limits for tasks assessed                      General Comments      Exercises        Assessment/Plan    PT Assessment Patient needs continued PT services  PT Diagnosis Difficulty walking;Generalized weakness   PT Problem List Decreased strength;Decreased activity tolerance;Decreased mobility;Decreased safety awareness  PT Treatment Interventions Gait training;Therapeutic exercise;Balance training;Functional mobility training;Therapeutic activities   PT Goals (Current goals can be found in the Care Plan section) Acute Rehab PT Goals Patient Stated Goal: "I really would rather not go to rehab" PT  Goal Formulation: With patient Time For Goal Achievement: 03/18/15    Frequency Min 2X/week   Barriers to discharge        Co-evaluation               End of Session Equipment Utilized During Treatment: Gait belt;Oxygen (4 liters, attempted bed exercises on room air, sats dropped) Activity Tolerance: Patient limited by fatigue Patient left: with bed alarm set           Time: [id number] PT Time Calculation (min) (ACUTE ONLY): 31 min   Charges:   PT Evaluation $PT Eval Low Complexity: 1 Procedure     PT G Codes:       Wayne Both, PT, DPT [phone] Kreg Shropshire 03/04/2015, 11:58 AM

## 2015-03-04 NOTE — Progress Notes (Signed)
PHARMACIST - PHYSICIAN COMMUNICATION DR:   Sain[place] CONCERNING: Antibiotic IV to Oral Route Change Policy  RECOMMENDATION: This patient is receiving azithromycin by the intravenous route.  Based on criteria approved by the Pharmacy and Therapeutics Committee, the antibiotic(s) is/are being converted to the equivalent oral dose form(s).   DESCRIPTION: These criteria include:  Patient being treated for a respiratory tract infection, urinary tract infection, cellulitis or clostridium difficile associated diarrhea if on metronidazole  The patient is not neutropenic and does not exhibit a GI malabsorption state  The patient is eating (either orally or via tube) and/or has been taking other orally administered medications for a least 24 hours  The patient is improving clinically and has a Tmax < 100.5  If you have questions about this conversion, please contact the Pharmacy Department  []  ( [phone] )  [place] [x]  ( [phone] )  [facility] []  ( [phone] )  [place] []  ( [phone] )  [facility] []  ( [phone] )  [facility]   

## 2015-03-04 NOTE — Care Management Important Message (Signed)
Important Message  Patient Details  Name: Nicole Austin MRN: [id number] Date of Birth: [birth year]   Medicare Important Message Given:  Yes    Nahiara Kretzschmar A, RN 03/04/2015, 1:16 PM

## 2015-03-04 NOTE — Progress Notes (Signed)
[facility] [place] NAME: Nicole Austin    MR#:  [id number]  DATE OF BIRTH:  03-07-1960  SUBJECTIVE:   Patient here due to shortness of breath, wheezing and noted to be in acute respiratory failure. Also noted to have sepsis secondary to pneumonia. Awake and worked with PT today. Wheezing/shortness of breath improved.   REVIEW OF SYSTEMS:    Review of Systems  Constitutional: Positive for fever. Negative for chills.  HENT: Negative for congestion and tinnitus.   Eyes: Negative for blurred vision and double vision.  Respiratory: Positive for cough and wheezing. Negative for shortness of breath.   Cardiovascular: Negative for chest pain, orthopnea and PND.  Gastrointestinal: Negative for nausea, vomiting, abdominal pain and diarrhea.  Genitourinary: Negative for dysuria and hematuria.  Neurological: Negative for dizziness, sensory change and focal weakness.  Psychiatric/Behavioral: Positive for depression.  All other systems reviewed and are negative.   Nutrition: Heart healthy Tolerating Diet: Yes Tolerating PT: Eval noted.   DRUG ALLERGIES:   Allergies  Allergen Reactions  . Codeine Itching    VITALS:  Blood pressure 128/61, pulse 81, temperature 98.1 F (36.7 C), temperature source Oral, resp. rate 18, height 5\' 1"  (1.549 m), weight 118.389 kg (261 lb), SpO2 91 %.  PHYSICAL EXAMINATION:   Physical Exam  GENERAL:  55 y.o.-year-old obese patient lying in the bed in NAD.   EYES: Pupils equal, round, reactive to light and accommodation. No scleral icterus. Extraocular muscles intact.  HEENT: Head atraumatic, normocephalic. Oropharynx and nasopharynx clear.  NECK:  Supple, no jugular venous distention. No thyroid enlargement, no tenderness.  LUNGS: End-exp. Wheezing b/l. Good air entry bilaterally. No use of accessory muscles of respiration.  CARDIOVASCULAR: S1, S2 normal. No murmurs, rubs, or gallops.  ABDOMEN: Soft,  nontender, nondistended. Bowel sounds present. No organomegaly or mass.  EXTREMITIES: No cyanosis, clubbing or edema b/l.    NEUROLOGIC: Cranial nerves II through XII are intact. No focal Motor or sensory deficits b/l. Globally weak  PSYCHIATRIC: The patient is alert and oriented x 3. SKIN: No obvious rash, lesion, or ulcer.    LABORATORY PANEL:   CBC  Recent Labs Lab 03/03/15 0511  WBC 9.9  HGB 12.9  HCT 38.8  PLT 186   ------------------------------------------------------------------------------------------------------------------  Chemistries   Recent Labs Lab 03/01/15 2327  03/03/15 0511  NA 136  < > 139  K 5.0  < > 4.2  CL 99*  < > 103  CO2 26  < > 28  GLUCOSE 130*  < > 150*  BUN 12  < > 12  CREATININE 0.81  0.87  < > 0.76  CALCIUM 8.6*  < > 8.3*  AST 413*  --   --   ALT 90*  --   --   ALKPHOS 75  --   --   BILITOT 1.8*  --   --   < > = values in this interval not displayed. ------------------------------------------------------------------------------------------------------------------  Cardiac Enzymes  Recent Labs Lab 03/01/15 2327  TROPONINI <0.03   ------------------------------------------------------------------------------------------------------------------  RADIOLOGY:  No results found.   ASSESSMENT AND PLAN:   55 year old female with past medical history of COPD, PTSD, bipolar disorder, history of previous CVA, depression, ADHD, who presented to the hospital due to shortness of breath, wheezing and noted to be in acute respiratory failure.  #1 acute respiratory failure with hypoxia-due to COPD exacerbation with pneumonia. -Continue O2 supplementation, continue treatment for COPD with pneumonia with IV steroids,  DuoNeb's, IV ceftriaxone and Zithromax. Will wean IV steroids.   -Improving and will wean O2 as tolerated.  #2 COPD exacerbation-due to pneumonia.  -Continue IV steroids bit will wean today, cont. Pulmicort nebs, ceftriaxone and  Zithromax for the pneumonia. -Continue duo nebs PRN - assess for Home O2 prior to discharge.   #3 pneumonia-community acquired. -Continue ceftriaxone, Zithromax and cultures so far (-).   #4 altered mental status/encephalopathy-etiology unclear. Possible polypharmacy. -ABG obtained showed no evidence of CO2 retention. -Patient is on high doses of Lyrica, trazodone, Seroquel. Appreciate Psych input and no change in meds so far.    #5 history of bipolar disorder/Depression-continue Seroquel, Depakote, Lexapro - appreciate psychiatric input and no change in meds. No Acute need for inpatient psychiatric hospitalization.  #6 anxiety-continue Xanax as needed.  #7 osteoarthritis-continue meloxicam.  #8 neuropathy-continue Lyrica  #9 Sepsis - pt. Ruled in admission due to fever, tachycardia, CXR + for pneumonia.  - afebrile overnight, hemodynamically stable. - cont. IV abx (Ceftriaxone, Zithromax). Cultures (-) so far.    Seen by PT and ?? SNF (vs) Home w/ Home health depending on progress and they will reevaluate pt. Tomorrow.   All the records are reviewed and case discussed with Care Management/Social Workerr. Management plans discussed with the patient, family and they are in agreement.  CODE STATUS: full  DVT Prophylaxis: Lovenox  TOTAL TIME TAKING CARE OF THIS PATIENT: 30 minutes.   POSSIBLE D/C IN 1-2 DAYS, DEPENDING ON CLINICAL CONDITION.   Henreitta Leber M.D on 03/04/2015 at 3:15 PM  Between 7am to 6pm - Pager - [phone]  After 6pm go to [email or web address] - password [email or web address] [facility]  Office  [phone]  CC: Primary care physician; Bobetta Lime, MD

## 2015-03-04 NOTE — Progress Notes (Signed)
Pt is alert and oriented, no complaints of pain.  On 2-4 L [place], expiratory wheezing at times.  Tolerating diet, up to [facility].  VSS, afebrile.

## 2015-03-04 NOTE — Clinical Social Work Note (Signed)
Clinical Social Work Assessment  Patient Details  Name: Nicole Austin MRN: [id number] Date of Birth: [birth year]  Date of referral:  03/04/15               Reason for consult:  Facility Placement                Permission sought to share information with:    Permission granted to share information::  No  Name::        Agency::     Relationship::     Contact Information:     Housing/Transportation Living arrangements for the past 2 months:  Single Family Home Source of Information:  Patient Patient Interpreter Needed:  None Criminal Activity/Legal Involvement Pertinent to Current Situation/Hospitalization:    Significant Relationships:    Lives with:  Roommate Do you feel safe going back to the place where you live?  Yes Need for family participation in patient care:  No (Coment)  Care giving concerns:  None at this time   Facilities manager / plan:  Holiday representative (CSW) consult for SNF placement, PT is recommending STR to assist with getting patient back to previous level of functioning.   Patient was alert, oriented and engaged in conversation with CSW.  .  CSW introduced self and explained role of CSW department.    Patient is on disability and currently lives with her roomate. Patient has two sons that live in [place].  Her primary support is her roommate.  Patient states her roommate does not work and will be home with her during the day.  CSW explained that PT is recommending rehab.  Patient has been to SNF and does not want to return. CSW reviewed SNF process with patient, Patient is not agreeable to SNF search as of today as she states she wants to discharge home with PT.    CSW will follow patient in the event she changes her mind and needs SNF for STR.  Employment status:  Disabled (Comment on whether or not currently receiving Disability) Insurance information:  Programmer, applications PT Recommendations:  Home with [place], [place] / Referral to community resources:   (none at this time as patient does not want SNF)  Patient/Family's Response to care:  Patient was appreciative of information provided by CSW.  Patient/Family's Understanding of and Emotional Response to Diagnosis, Current Treatment, and Prognosis:  Patient understands that she is under continued medical work up at this time.  Once medically stable she wants to discharge home with home health PT.  Emotional Assessment Appearance:  Appears stated age Attitude/Demeanor/Rapport:    Affect (typically observed):  Accepting, Tearful/Crying, Pleasant Orientation:  Oriented to Self, Oriented to Place, Oriented to  Time, Oriented to Situation Alcohol / Substance use:  Tobacco Use Psych involvement (Current and /or in the community):  Yes (Comment) (evaluated by psych)  Discharge Needs  Concerns to be addressed:  Care Coordination, Discharge Planning Concerns Readmission within the last 30 days:  Yes Current discharge risk:  Dependent with Mobility, Chronically ill, Psychiatric Illness Barriers to Discharge:  Continued Medical Work up, No Barriers Identified   Maurine Cane, LCSW 03/04/2015, 4:12 PM

## 2015-03-05 MED ORDER — METHYLPREDNISOLONE SODIUM SUCC 40 MG IJ SOLR
40.0000 mg | Freq: Every day | INTRAMUSCULAR | Status: DC
  Administered 2015-03-06 – 2015-03-08 (×3): 40 mg via INTRAVENOUS
  Filled 2015-03-05 (×3): qty 1

## 2015-03-05 NOTE — Progress Notes (Signed)
[facility] at [place] NAME: Nicole Austin    MR#:  [id number]  DATE OF BIRTH:  Sep 08, 1960  SUBJECTIVE:   Patient here due to shortness of breath, wheezing and noted to be in acute respiratory failure. Also noted to have sepsis secondary to pneumonia. Feels better today, less wheezing. No significant sputum production. Overall feels improved , being weaned to room air  REVIEW OF SYSTEMS:    Review of Systems  Constitutional: Positive for fever. Negative for chills.  HENT: Negative for congestion and tinnitus.   Eyes: Negative for blurred vision and double vision.  Respiratory: Positive for cough and wheezing. Negative for shortness of breath.   Cardiovascular: Negative for chest pain, orthopnea and PND.  Gastrointestinal: Negative for nausea, vomiting, abdominal pain and diarrhea.  Genitourinary: Negative for dysuria and hematuria.  Neurological: Negative for dizziness, sensory change and focal weakness.  Psychiatric/Behavioral: Positive for depression.  All other systems reviewed and are negative.   Nutrition: Heart healthy Tolerating Diet: Yes Tolerating PT: Eval noted.   DRUG ALLERGIES:   Allergies  Allergen Reactions  . Codeine Itching    VITALS:  Blood pressure 124/78, pulse 81, temperature 97.7 F (36.5 C), temperature source Oral, resp. rate 17, height 5\' 1"  (1.549 m), weight 118.389 kg (261 lb), SpO2 95 %.  PHYSICAL EXAMINATION:   Physical Exam  GENERAL:  55 y.o.-year-old obese patient lying in the bed in NAD.   EYES: Pupils equal, round, reactive to light and accommodation. No scleral icterus. Extraocular muscles intact.  HEENT: Head atraumatic, normocephalic. Oropharynx and nasopharynx clear.  NECK:  Supple, no jugular venous distention. No thyroid enlargement, no tenderness.  LUNGS: End-exp. Wheezing b/l. Good air entry bilaterally. No use of accessory muscles of respiration.  CARDIOVASCULAR: S1, S2 normal. No  murmurs, rubs, or gallops.  ABDOMEN: Soft, nontender, nondistended. Bowel sounds present. No organomegaly or mass.  EXTREMITIES: No cyanosis, clubbing or edema b/l.    NEUROLOGIC: Cranial nerves II through XII are intact. No focal Motor or sensory deficits b/l. Globally weak  PSYCHIATRIC: The patient is alert and oriented x 3. SKIN: No obvious rash, lesion, or ulcer.    LABORATORY PANEL:   CBC  Recent Labs Lab 03/03/15 0511  WBC 9.9  HGB 12.9  HCT 38.8  PLT 186   ------------------------------------------------------------------------------------------------------------------  Chemistries   Recent Labs Lab 03/01/15 2327  03/03/15 0511  NA 136  < > 139  K 5.0  < > 4.2  CL 99*  < > 103  CO2 26  < > 28  GLUCOSE 130*  < > 150*  BUN 12  < > 12  CREATININE 0.81  0.87  < > 0.76  CALCIUM 8.6*  < > 8.3*  AST 413*  --   --   ALT 90*  --   --   ALKPHOS 75  --   --   BILITOT 1.8*  --   --   < > = values in this interval not displayed. ------------------------------------------------------------------------------------------------------------------  Cardiac Enzymes  Recent Labs Lab 03/01/15 2327  TROPONINI <0.03   ------------------------------------------------------------------------------------------------------------------  RADIOLOGY:  No results found.   ASSESSMENT AND PLAN:   55 year old female with past medical history of COPD, PTSD, bipolar disorder, history of previous CVA, depression, ADHD, who presented to the hospital due to shortness of breath, wheezing and noted to be in acute respiratory failure.  #1 acute respiratory failure with hypoxia-due to COPD exacerbation with pneumonia. -Continue O2 supplementation, continue treatment  for COPD with pneumonia with IV steroids, DuoNeb's, IV ceftriaxone and Zithromax. Weaning off  IV steroids.   -Improving and will wean O2 as tolerated.  #2 COPD exacerbation-due to pneumonia.  -Continue IV steroids, , cont.  Pulmicort nebs, ceftriaxone and Zithromax for the pneumonia. -Continue duo nebs PRN - assess for Home O2 prior to discharge.   #3 pneumonia-community acquired. -Continue ceftriaxone, Zithromax and cultures and not taken.   #4 altered mental status/encephalopathy-etiology unclear. Possible polypharmacy. -ABG obtained showed no evidence of CO2 retention. -Patient is on high doses of Lyrica, trazodone, Seroquel. Appreciate Psych input and no change in meds so far.  She is alert today and has complaints  #5 history of bipolar disorder/Depression-continue Seroquel, Depakote, Lexapro - appreciate psychiatric input and no change in meds. No Acute need for inpatient psychiatric hospitalization.  #6 anxiety-continue Xanax as needed.  #7 osteoarthritis-continue meloxicam.  #8 neuropathy-continue Lyrica  #9 Sepsis - pt. Ruled in admission due to fever, tachycardia, CXR + for pneumonia.  - afebrile overnight, hemodynamically stable. - cont. IV abx (Ceftriaxone, Zithromax). Cultures (-) so far.    Seen by PT and ?? SNF (vs) Home w/ Home health depending on progress and they will reevaluate pt. Tomorrow.   All the records are reviewed and case discussed with Care Management/Social Workerr. Management plans discussed with the patient, family and they are in agreement.  CODE STATUS: full  DVT Prophylaxis: Lovenox  TOTAL TIME TAKING CARE OF THIS PATIENT: 35 minutes.   POSSIBLE D/C IN 1-2 DAYS, DEPENDING ON CLINICAL CONDITION.   Theodoro Grist M.D on 03/05/2015 at 12:54 PM  Between 7am to 6pm - Pager - [phone]  After 6pm go to [email or web address] - password [email or web address] [facility]  Office  [phone]  CC: Primary care physician; Bobetta Lime, MD

## 2015-03-05 NOTE — Plan of Care (Signed)
Problem: Respiratory: Goal: Respiratory status will improve Outcome: Progressing Pt now able to tolerate 2L of oxygen. Able to move around in room with minimal shortness of breath. Goal: Pain level will decrease Outcome: Progressing No complaints of pain this shift.

## 2015-03-06 LAB — CBC
HEMATOCRIT: 39.5 % (ref 35.0–47.0)
HEMOGLOBIN: 12.8 g/dL (ref 12.0–16.0)
MCH: 30.1 pg (ref 26.0–34.0)
MCHC: 32.5 g/dL (ref 32.0–36.0)
MCV: 92.6 fL (ref 80.0–100.0)
Platelets: 252 10*3/uL (ref 150–440)
RBC: 4.27 MIL/uL (ref 3.80–5.20)
RDW: 15.2 % — ABNORMAL HIGH (ref 11.5–14.5)
WBC: 9.3 10*3/uL (ref 3.6–11.0)

## 2015-03-06 LAB — CREATININE, SERUM
Creatinine, Ser: 0.92 mg/dL (ref 0.44–1.00)
GFR calc Af Amer: >60 mL/min (ref >60)

## 2015-03-06 LAB — HEMOGLOBIN A1C: HEMOGLOBIN A1C: 5.7 % (ref 4.0–6.0)

## 2015-03-06 NOTE — Care Management Note (Signed)
Case Management Note  Patient Details  Name: Nicole Austin MRN: [id number] Date of Birth: 1960/07/21  Subjective/Objective:      No discharge orders written and per Dr Keenan Bachelor note today, discharge is anticipated in 1-2 days. Remains on oxygen.              Action/Plan:   Expected Discharge Date:                  Expected Discharge Plan:     In-House Referral:     Discharge planning Services     Post Acute Care Choice:    Choice offered to:     DME Arranged:    DME Agency:     HH Arranged:    [place] Agency:     Status of Service:     Medicare Important Message Given:  Yes Date Medicare IM Given:    Medicare IM give by:    Date Additional Medicare IM Given:    Additional Medicare Important Message give by:     If discussed at [place] of Stay Meetings, dates discussed:    Additional Comments:  Mclain Freer A, RN 03/06/2015, 3:59 PM

## 2015-03-06 NOTE — Progress Notes (Signed)
[facility] at [place] NAME: Nicole Austin    MR#:  [id number]  DATE OF BIRTH:  1960/11/12  SUBJECTIVE:   Patient here due to shortness of breath, wheezing and noted to be in acute respiratory failure. Also noted to have sepsis secondary to pneumonia. Feels good today, less wheezing. No significant sputum production. Overall feels improved , being weaned to room air, but still remains on 2 L of oxygen through nasal cannula. O2 sats on room air at rest as well as on exertion and still pending. Patient wants to go home ASAP  REVIEW OF SYSTEMS:    Review of Systems  Constitutional: Positive for fever. Negative for chills.  HENT: Negative for congestion and tinnitus.   Eyes: Negative for blurred vision and double vision.  Respiratory: Positive for cough and wheezing. Negative for shortness of breath.   Cardiovascular: Negative for chest pain, orthopnea and PND.  Gastrointestinal: Negative for nausea, vomiting, abdominal pain and diarrhea.  Genitourinary: Negative for dysuria and hematuria.  Neurological: Negative for dizziness, sensory change and focal weakness.  Psychiatric/Behavioral: Positive for depression.  All other systems reviewed and are negative.   Nutrition: Heart healthy Tolerating Diet: Yes Tolerating PT: Eval noted.   DRUG ALLERGIES:   Allergies  Allergen Reactions  . Codeine Itching    VITALS:  Blood pressure 118/57, pulse 72, temperature 98.1 F (36.7 C), temperature source Oral, resp. rate 18, height 5\' 1"  (1.549 m), weight 118.389 kg (261 lb), SpO2 92 %.  PHYSICAL EXAMINATION:   Physical Exam  GENERAL:  55 year old obese patient lying in the bed in NAD. lying in the bed in NAD.   EYES: Pupils equal, round, reactive to light and accommodation. No scleral icterus. Extraocular muscles intact.  HEENT: Head atraumatic, normocephalic. Oropharynx and nasopharynx clear.  NECK:  Supple, no jugular venous distention. No thyroid  enlargement, no tenderness.  LUNGS: Minimal expiratory wheezing b/l at bases Good air entry bilaterally. No use of accessory muscles of respiration.  CARDIOVASCULAR: S1, S2 normal. No murmurs, rubs, or gallops.  ABDOMEN: Soft, nontender, nondistended. Bowel sounds present. No organomegaly or mass.  EXTREMITIES: No cyanosis, clubbing or edema b/l.    NEUROLOGIC: Cranial nerves II through XII are intact. No focal Motor or sensory deficits b/l. Globally weak  PSYCHIATRIC: The patient is alert and oriented x 3. SKIN: No obvious rash, lesion, or ulcer.    LABORATORY PANEL:   CBC  Recent Labs Lab 03/06/15 0726  WBC 9.3  HGB 12.8  HCT 39.5  PLT 252   ------------------------------------------------------------------------------------------------------------------  Chemistries   Recent Labs Lab 03/01/15 2327  03/03/15 0511 03/06/15 0726  NA 136  < > 139  --   K 5.0  < > 4.2  --   CL 99*  < > 103  --   CO2 26  < > 28  --   GLUCOSE 130*  < > 150*  --   BUN 12  < > 12  --   CREATININE 0.81  0.87  < > 0.76 0.92  CALCIUM 8.6*  < > 8.3*  --   AST 413*  --   --   --   ALT 90*  --   --   --   ALKPHOS 75  --   --   --   BILITOT 1.8*  --   --   --   < > = values in this interval not displayed. ------------------------------------------------------------------------------------------------------------------  Cardiac Enzymes  Recent Labs Lab 03/01/15 2327  TROPONINI <0.03   ------------------------------------------------------------------------------------------------------------------  RADIOLOGY:  No results found.   ASSESSMENT AND PLAN:   55 year old female with past medical history of COPD, PTSD, bipolar disorder, history of previous CVA, depression, ADHD, who presented to the hospital due to shortness of breath, wheezing and noted to be in acute respiratory failure.  #1 acute respiratory failure with hypoxia-due to COPD exacerbation with pneumonia. -Continue O2  supplementation, continue treatment for COPD with pneumonia with IV steroids, DuoNeb's, IV ceftriaxone and Zithromax. Weaning off  IV steroids, weaning off to room air. Follow O2 sats on room air on exertion as well as at rest and make decisions about discharging patient home, depending on oxygenation.     #2 COPD exacerbation-due to pneumonia.  -Continue IV steroids, , cont. Pulmicort nebs, ceftriaxone and Zithromax for the pneumonia. -Continue duo nebs PRN - assess for Home O2 prior to discharge.   #3 pneumonia-community acquired. -Continue ceftriaxone, Zithromax and cultures and not done.   #4 altered mental status/encephalopathy-etiology unclear. Possible polypharmacy. -ABG obtained showed no evidence of CO2 retention. -Patient is on high doses of Lyrica, trazodone, Seroquel. Appreciate Psych input and no change in meds so far.  She is alert today and has complaints  #5 history of bipolar disorder/Depression-continue Seroquel, Depakote, Lexapro - appreciate psychiatric input and no change in meds. No Acute need for inpatient psychiatric hospitalization.  #6 anxiety-continue Xanax as needed.  #7 osteoarthritis-continue meloxicam.  #8 neuropathy-continue Lyrica  #9 Sepsis - pt. Ruled in admission due to fever, tachycardia, CXR + for pneumonia.  - afebrile overnight, hemodynamically stable. - cont. IV abx (Ceftriaxone, Zithromax). Cultures were not done   All the records are reviewed and case discussed with Care Management/Social Workerr. Management plans discussed with the patient, family and they are in agreement.  CODE STATUS: full  DVT Prophylaxis: Lovenox  TOTAL TIME TAKING CARE OF THIS PATIENT: 35 minutes.   POSSIBLE D/C IN 1-2 DAYS, DEPENDING ON CLINICAL CONDITION.   Theodoro Grist M.D on 03/06/2015 at 11:51 AM  Between 7am to 6pm - Pager - [phone]  After 6pm go to [email or web address] - password [email or web address] [facility]  Office   [phone]  CC: Primary care physician; Bobetta Lime, MD

## 2015-03-06 NOTE — Plan of Care (Signed)
Problem: Respiratory: Goal: Respiratory status will improve Outcome: Progressing Weaned to 1 liter of oxygen

## 2015-03-06 NOTE — Progress Notes (Signed)
Oxygen study showed patient oxygen saturations on room air dropped to 88% at lowest point.  Placed patient back on 1 liter of oxygen and ranged from 92-94% on 1 liter of oxygen.  Educated patient on cough and deep breathing and incentive spirometer.  Patient was able to cough up sputum.

## 2015-03-07 ENCOUNTER — Encounter: Payer: Self-pay | Admitting: Radiology

## 2015-03-07 ENCOUNTER — Inpatient Hospital Stay: Payer: Commercial Managed Care - HMO

## 2015-03-07 ENCOUNTER — Ambulatory Visit: Payer: Commercial Managed Care - HMO | Admitting: Anesthesiology

## 2015-03-07 DIAGNOSIS — R7401 Elevation of levels of liver transaminase levels: Secondary | ICD-10-CM

## 2015-03-07 DIAGNOSIS — J181 Lobar pneumonia, unspecified organism: Secondary | ICD-10-CM

## 2015-03-07 DIAGNOSIS — J9601 Acute respiratory failure with hypoxia: Secondary | ICD-10-CM

## 2015-03-07 DIAGNOSIS — G9341 Metabolic encephalopathy: Secondary | ICD-10-CM

## 2015-03-07 DIAGNOSIS — J189 Pneumonia, unspecified organism: Secondary | ICD-10-CM

## 2015-03-07 DIAGNOSIS — J441 Chronic obstructive pulmonary disease with (acute) exacerbation: Secondary | ICD-10-CM

## 2015-03-07 DIAGNOSIS — R740 Nonspecific elevation of levels of transaminase and lactic acid dehydrogenase [LDH]: Secondary | ICD-10-CM

## 2015-03-07 LAB — HEPATIC FUNCTION PANEL
ALK PHOS: 71 U/L (ref 38–126)
ALT: 45 U/L (ref 14–54)
AST: 34 U/L (ref 15–41)
Albumin: 3 g/dL — ABNORMAL LOW (ref 3.5–5.0)
BILIRUBIN INDIRECT: 0.6 mg/dL (ref 0.3–0.9)
Bilirubin, Direct: 0.1 mg/dL (ref 0.1–0.5)
TOTAL PROTEIN: 6.1 g/dL — AB (ref 6.5–8.1)
Total Bilirubin: 0.7 mg/dL (ref 0.3–1.2)

## 2015-03-07 MED ORDER — IPRATROPIUM-ALBUTEROL 0.5-2.5 (3) MG/3ML IN SOLN: 3.0000 mL | RESPIRATORY_TRACT | Status: DC | PRN

## 2015-03-07 MED ORDER — IOHEXOL 350 MG/ML SOLN
100.0000 mL | Freq: Once | INTRAVENOUS | Status: AC | PRN
  Administered 2015-03-07: 100 mL via INTRAVENOUS

## 2015-03-07 MED ORDER — FUROSEMIDE 10 MG/ML IJ SOLN
20.0000 mg | Freq: Once | INTRAMUSCULAR | Status: AC
  Administered 2015-03-07: 20 mg via INTRAVENOUS
  Filled 2015-03-07: qty 2

## 2015-03-07 MED ORDER — PREDNISONE 10 MG (21) PO TBPK: 10.0000 mg | ORAL_TABLET | Freq: Every day | ORAL | Status: DC

## 2015-03-07 NOTE — Care Management Important Message (Signed)
Important Message  Patient Details  Name: Nicole Austin MRN: [id number] Date of Birth: 05/04/1960   Medicare Important Message Given:  Yes    Juliann Pulse A Edelin Fryer 03/07/2015, 1:44 PM

## 2015-03-07 NOTE — Progress Notes (Signed)
SATURATION QUALIFICATIONS: (This note is used to comply with regulatory documentation for home oxygen)  Patient Saturations on Room Air at Rest = 89%  Patient Saturations on Room Air while Ambulating = 84%  Patient Saturations on 2 Liters of oxygen while Ambulating = 92%  Please briefly explain why patient needs home oxygen: COPD 

## 2015-03-07 NOTE — Care Management Note (Addendum)
Case Management Note  Patient Details  Name: Mikalah Skyles MRN: [id number] Date of Birth: [birth year]  Subjective/Objective:                  Met with patient to discuss discharge planning oxygen is new and patient will require home O2 at discharge. Her PCP is with [facility] but she could not recall MD name. She has a walker, rollator and wheelchair available at home. She has a nebulizer at home. She lives with a roommate. She receives [employer] services through [employer] by [place].Patient does not feel that she will need HHPT.   Action/Plan: Referral made to Will with [facility] for home O2. CT scan pending.   Expected Discharge Date:                  Expected Discharge Plan:     In-House Referral:     Discharge planning Services  CM Consult  Post Acute Care Choice:  Durable Medical Equipment Choice offered to:  Patient  DME Arranged:  Oxygen DME Agency:  [place]:    [place] Agency:     Status of Service:  In process, will continue to follow  Medicare Important Message Given:  Yes Date Medicare IM Given:    Medicare IM give by:    Date Additional Medicare IM Given:    Additional Medicare Important Message give by:     If discussed at [place] of Stay Meetings, dates discussed:    Additional Comments: O2 tank has been delivered.   Marshell Garfinkel, RN 03/07/2015, 11:03 AM

## 2015-03-07 NOTE — Progress Notes (Signed)
Patient attempted all afternoon to get a ride home after discharge was ordered and friend who was going to come pick her up now states she is unable to pick her up.  Unable to send patient home with taxi due to oxygen tank and tubing and unsure if patient will have a way to get into the house.  Plan for discharge tomorrow am.

## 2015-03-07 NOTE — Progress Notes (Signed)
Placed 20G IV in right forearm for CT chest

## 2015-03-07 NOTE — Discharge Summary (Signed)
[facility] [place] NAME: Nicole Austin    MR#:  [id number]  DATE OF BIRTH:  1960-11-01  DATE OF ADMISSION:  03/01/2015 ADMITTING PHYSICIAN: Lytle Butte, MD  DATE OF DISCHARGE: No discharge date for patient encounter.  PRIMARY CARE PHYSICIAN: Bobetta Lime, MD     ADMISSION DIAGNOSIS:  Community acquired pneumonia [J18.9] Hypoxia [R09.02]  DISCHARGE DIAGNOSIS:  Principal Problem:   Bipolar I disorder ([place]) Active Problems:   Sepsis ([place])   Acute respiratory failure with hypoxia ([place])   COPD exacerbation (HCC)   Right lower lobe pneumonia   Encephalopathy, metabolic   Elevated transaminase level   SECONDARY DIAGNOSIS:   Past Medical History  Diagnosis Date  . COPD (chronic obstructive pulmonary disease) ([place])   . PTSD (post-traumatic stress disorder)   . Rhabdomyolysis   . Bipolar 1 disorder ([place])   . Arthritis   . Stroke ([place])   . MI (myocardial infarction) ([place])   . Depression   . ADHD (attention deficit hyperactivity disorder)   . Asthma     .pro HOSPITAL COURSE:  Patient is 55 year old female with past medical history significant for history of COPD, not on oxygen at home, bipolar disorder, stroke, coronary artery disease who presents to the hospital with complaints of shortness of breath and wheezing. She complained of cough and was having subjective fevers and chills. On arrival to emergency room, she was noticed to be hypoxic. Chest x-ray revealed patchy and confluent airspace opacity in the right lung base. Patient was admitted to the hospital for further evaluation and treatment. She was initiated on steroids, inhalation therapy, nebulizers as well as antibiotics. With this, her condition slowly improved, she was finally weaned off oxygen, however, still remained hypoxic on exertion, CT scan of the chest was performed which showed no acute pulmonary embolism. However, trace pleural and pericardial effusions.  No edema. There was noted. Nonspecific groundglass opacity in the left lung apex which was felt to be inflammatory. However, CT of chest was recommended in 3 months. Patient was advised to continue steroid taper inhalation therapy and follow up with her primary care physician.  Discussion by problem  #1 acute respiratory failure with hypoxia-due to COPD exacerbation with pneumonia. -Continue O2 supplementation on exertion, continue treatment for COPD with pneumonia with tapering doses of steroids, DuoNeb's, patient completed ceftriaxone and Zithromax course. CT scan of the chest with IV contrast showed no pulmonary embolism but some inflammatory lung disease, follow-up CT scan was recommended in 3 months  #2 COPD exacerbation-due to pneumonia.  -Continue to taper steroids, , cont. nebulizers. -Continue duo nebs PRN - assessed for Home O2 , qualified for oxygen therapy on exertion.   #3 pneumonia-community acquired. Completed ceftriaxone, Zithromax and cultures were not done.   #4 altered mental status/encephalopathy-etiology unclear. Possible polypharmacy. -ABG obtained showed no evidence of CO2 retention. -Patient is on high doses of Lyrica, trazodone, Seroquel. Patient was seen by Psych , recommended no change in meds, patient's encephalopathy was very likely exacerbated by infection/hypoxemia . She is alert now and has complaints.   #5 history of bipolar disorder/Depression-continue Seroquel, Depakote, Lexapro - appreciate psychiatric input and no change in meds. No Acute need for inpatient psychiatric hospitalization.  #6 anxiety-continue Xanax as needed.  #7 osteoarthritis-continue meloxicam.  #8 neuropathy-continue Lyrica  #9 Sepsis - pt. Ruled in admission due to fever, tachycardia, CXR + for pneumonia. Resolved now  #10. Elevated transaminases of unclear etiology, patient's transaminase levels normalized  DISCHARGE CONDITIONS:   Stable  CONSULTS OBTAINED:  Treatment  Team:  Lytle Butte, MD Gonzella Lex, MD  DRUG ALLERGIES:   Allergies  Allergen Reactions  . Codeine Itching    DISCHARGE MEDICATIONS:   Current Discharge Medication List    START taking these medications   Details  ipratropium-albuterol (DUONEB) 0.5-2.5 (3) MG/3ML SOLN Take 3 mLs by nebulization every 4 (four) hours as needed. Qty: 360 mL, Refills: 6    predniSONE (STERAPRED UNI-PAK 21 TAB) 10 MG (21) TBPK tablet Take 1 tablet (10 mg total) by mouth daily. Please take 6 pills on the day 1 and 2, then 5 pills on day 3 and 4, then taper by 10 mg every 2 days until finished, thank you Qty: 42 tablet, Refills: 0      CONTINUE these medications which have NOT CHANGED   Details  albuterol (PROVENTIL HFA;VENTOLIN HFA) 108 (90 BASE) MCG/ACT inhaler Inhale 1-2 puffs into the lungs every 6 (six) hours as needed for wheezing or shortness of breath. Qty: 6.7 g, Refills: 1   Associated Diagnoses: COPD, moderate (HCC)    ALPRAZolam (XANAX) 1 MG tablet Take 1 tablet (1 mg total) by mouth 4 (four) times daily as needed for anxiety. Qty: 120 tablet, Refills: 2    cyclobenzaprine (FLEXERIL) 5 MG tablet Take 2 tablets (10 mg total) by mouth 3 (three) times daily as needed for muscle spasms. Qty: 90 tablet, Refills: 5   Associated Diagnoses: Muscle spasms of both lower extremities    divalproex (DEPAKOTE) 250 MG DR tablet Take 1 tablet (250 mg total) by mouth 2 (two) times daily. Qty: 60 tablet, Refills: 1    escitalopram (LEXAPRO) 20 MG tablet Take 1 tablet (20 mg total) by mouth daily. Qty: 30 tablet, Refills: 2   Associated Diagnoses: Bipolar disorder, in full remission, most recent episode depressed ([place]); PTSD (post-traumatic stress disorder)    lidocaine (XYLOCAINE) 5 % ointment Apply 1 application topically as needed for mild pain.    LYRICA 200 MG capsule Take 1 capsule (200 mg total) by mouth 2 (two) times daily. Qty: 60 capsule, Refills: 5   Associated Diagnoses: Chronic  pain disorder    meloxicam (MOBIC) 15 MG tablet Take 1 tablet (15 mg total) by mouth daily. Qty: 30 tablet, Refills: 0    Misc Natural Products (OSTEO BI-FLEX ADV TRIPLE ST) TABS Take 1 tablet by mouth 2 (two) times daily.    Multiple Vitamin (MULTIVITAMIN WITH MINERALS) TABS tablet Take 1 tablet by mouth daily.    naproxen (NAPROSYN) 500 MG tablet Take 1 tablet (500 mg total) by mouth 2 (two) times daily with a meal. Qty: 30 tablet, Refills: 0    QUEtiapine (SEROQUEL XR) 300 MG 24 hr tablet Take 1 tablet (300 mg total) by mouth every evening. Qty: 30 tablet, Refills: 2   Associated Diagnoses: Bipolar disorder, in full remission, most recent episode depressed (HCC)    traMADol (ULTRAM) 50 MG tablet Take 1 tablet (50 mg total) by mouth every 8 (eight) hours as needed. Qty: 60 tablet, Refills: 0    traZODone (DESYREL) 100 MG tablet Take 1 tablet (100 mg total) by mouth 2 (two) times daily. Qty: 60 tablet, Refills: 1         DISCHARGE INSTRUCTIONS:    Patient is to follow-up with primary care physician, she is going to be followed by home health nursing staff  If you experience worsening of your admission symptoms, develop shortness of breath, life threatening  emergency, suicidal or homicidal thoughts you must seek medical attention immediately by calling 911 or calling your MD immediately  if symptoms less severe.  You Must read complete instructions/literature along with all the possible adverse reactions/side effects for all the Medicines you take and that have been prescribed to you. Take any new Medicines after you have completely understood and accept all the possible adverse reactions/side effects.   Please note  You were cared for by a hospitalist during your hospital stay. If you have any questions about your discharge medications or the care you received while you were in the hospital after you are discharged, you can call the unit and asked to speak with the hospitalist  on call if the hospitalist that took care of you is not available. Once you are discharged, your primary care physician will handle any further medical issues. Please note that NO REFILLS for any discharge medications will be authorized once you are discharged, as it is imperative that you return to your primary care physician (or establish a relationship with a primary care physician if you do not have one) for your aftercare needs so that they can reassess your need for medications and monitor your lab values.    Today   CHIEF COMPLAINT:   Chief Complaint  Patient presents with  . Respiratory Distress    HISTORY OF PRESENT ILLNESS:  Heilyn Gagne  is a 55 y.o. female with a known history of COPD, not on oxygen at home, bipolar disorder, stroke, coronary artery disease who presents to the hospital with complaints of shortness of breath and wheezing. She complained of cough and was having subjective fevers and chills. On arrival to emergency room, she was noticed to be hypoxic. Chest x-ray revealed patchy and confluent airspace opacity in the right lung base. Patient was admitted to the hospital for further evaluation and treatment. She was initiated on steroids, inhalation therapy, nebulizers as well as antibiotics. With this, her condition slowly improved, she was finally weaned off oxygen, however, still remained hypoxic on exertion, CT scan of the chest was performed which showed no acute pulmonary embolism. However, trace pleural and pericardial effusions. No edema. There was noted. Nonspecific groundglass opacity in the left lung apex which was felt to be inflammatory. However, CT of chest was recommended in 3 months. Patient was advised to continue steroid taper inhalation therapy and follow up with her primary care physician. Discussion by problem  #1 acute respiratory failure with hypoxia-due to COPD exacerbation with pneumonia. -Continue O2 supplementation on exertion, continue treatment  for COPD with pneumonia with tapering doses of steroids, DuoNeb's, patient completed ceftriaxone and Zithromax course. CT scan of the chest with IV contrast showed no pulmonary embolism but some inflammatory lung disease, follow-up CT scan was recommended in 3 months  #2 COPD exacerbation-due to pneumonia.  -Continue to taper steroids, , cont. nebulizers. -Continue duo nebs PRN - assessed for Home O2 , qualified for oxygen therapy on exertion.   #3 pneumonia-community acquired. Completed ceftriaxone, Zithromax and cultures were not done.   #4 altered mental status/encephalopathy-etiology unclear. Possible polypharmacy. -ABG obtained showed no evidence of CO2 retention. -Patient is on high doses of Lyrica, trazodone, Seroquel. Patient was seen by Psych , recommended no change in meds, patient's encephalopathy was very likely exacerbated by infection/hypoxemia . She is alert now and has complaints.   #5 history of bipolar disorder/Depression-continue Seroquel, Depakote, Lexapro - appreciate psychiatric input and no change in meds. No Acute need for inpatient psychiatric  hospitalization.  #6 anxiety-continue Xanax as needed.  #7 osteoarthritis-continue meloxicam.  #8 neuropathy-continue Lyrica  #9 Sepsis - pt. Ruled in admission due to fever, tachycardia, CXR + for pneumonia. Resolved now #10. Elevated transaminases of unclear etiology, patient's transaminase levels normalized  VITAL SIGNS:  Blood pressure 108/53, pulse 78, temperature 98 F (36.7 C), temperature source Oral, resp. rate 18, height 5\' 1"  (1.549 m), weight 118.389 kg (261 lb), SpO2 92 %.  I/O:   Intake/Output Summary (Last 24 hours) at 03/07/15 1516 Last data filed at 03/07/15 1301  Gross per 24 hour  Intake    600 ml  Output    500 ml  Net    100 ml    PHYSICAL EXAMINATION:  GENERAL:  55 y.o.-year-old patient lying in the bed with no acute distress.  EYES: Pupils equal, round, reactive to light and  accommodation. No scleral icterus. Extraocular muscles intact.  HEENT: Head atraumatic, normocephalic. Oropharynx and nasopharynx clear.  NECK:  Supple, no jugular venous distention. No thyroid enlargement, no tenderness.  LUNGS: Normal breath sounds bilaterally, no wheezing, rales,rhonchi or crepitation. No use of accessory muscles of respiration.  CARDIOVASCULAR: S1, S2 normal. No murmurs, rubs, or gallops.  ABDOMEN: Soft, non-tender, non-distended. Bowel sounds present. No organomegaly or mass.  EXTREMITIES: No pedal edema, cyanosis, or clubbing.  NEUROLOGIC: Cranial nerves II through XII are intact. Muscle strength 5/5 in all extremities. Sensation intact. Gait not checked.  PSYCHIATRIC: The patient is alert and oriented x 3.  SKIN: No obvious rash, lesion, or ulcer.   DATA REVIEW:   CBC  Recent Labs Lab 03/06/15 0726  WBC 9.3  HGB 12.8  HCT 39.5  PLT 252    Chemistries   Recent Labs Lab 03/03/15 0511 03/06/15 0726 03/07/15 1121  NA 139  --   --   K 4.2  --   --   CL 103  --   --   CO2 28  --   --   GLUCOSE 150*  --   --   BUN 12  --   --   CREATININE 0.76 0.92  --   CALCIUM 8.3*  --   --   AST  --   --  34  ALT  --   --  45  ALKPHOS  --   --  71  BILITOT  --   --  0.7    Cardiac Enzymes  Recent Labs Lab 03/01/15 2327  TROPONINI <0.03    Microbiology Results  Results for orders placed or performed during the hospital encounter of 06/14/14  Urine culture     Status: None   Collection Time: 06/14/14  4:50 PM  Result Value Ref Range Status   Micro Text Report   Final       SOURCE: CLEAN CATCH    ORGANISM 1                >100,000 CFU/ML Candida glabrata   COMMENT                   -   ANTIBIOTIC                    ORG#1                                                 RADIOLOGY:  Ct Angio Chest Pe W/cm &/or Wo Cm  03/07/2015  CLINICAL DATA:  55 year old female with shortness of breath, wheezing, acute respiratory failure. Possible sepsis,  pneumonia. Initial encounter. EXAM: CT ANGIOGRAPHY CHEST WITH CONTRAST TECHNIQUE: Multidetector CT imaging of the chest was performed using the standard protocol during bolus administration of intravenous contrast. Multiplanar CT image reconstructions and MIPs were obtained to evaluate the vascular anatomy. CONTRAST:  129mL OMNIPAQUE IOHEXOL 350 MG/ML SOLN COMPARISON:  Portable chest radiograph 03/01/2015. Chest CTA 11/05/2014. FINDINGS: Adequate contrast bolus timing in the pulmonary arterial tree. Mild respiratory motion artifact. No focal filling defect identified in the pulmonary arterial tree to suggest the presence of acute pulmonary embolism. There are small to trace bilateral layering pleural effusions today. Similar trace pericardial effusion. Stable cardiac size. Negative visualized aorta aside from mild soft and calcified atherosclerosis. No definite calcified coronary artery plaque. No mediastinal or hilar lymphadenopathy. No axillary lymphadenopathy. Negative thoracic inlet. Incidental small volume of gas in the thoracic esophagus. Stable visualized liver, gallbladder, spleen, pancreas, adrenal glands, kidneys, and bowel in the upper abdomen. Lower lung volumes today with mild right middle lobe atelectasis. Small nonspecific area of ground-glass opacity in the left lung apex is new on series 6, image 24. Major airways are patent. Mild dependent atelectasis also at both lung bases. Mild chronic paraseptal emphysema at the apices. No acute osseous abnormality identified. Review of the MIP images confirms the above findings. IMPRESSION: 1.  No evidence of acute pulmonary embolus. 2. Trace pleural and pericardial effusions today. No overt pulmonary edema. Mildly lower lung volumes with increased atelectasis. 3. 2 cm area of nonspecific ground-glass opacity in the left lung apex is new. Favor inflammatory, but initial follow-up by Chest CT without contrast is recommended in 3 months to confirm persistence.  This recommendation follows the consensus statement: Recommendations for the Management of Subsolid Pulmonary Nodules Detected at CT: A Statement from the [place] as published in Radiology 2013; 266:304-317. Electronically Signed   By: Genevie Ann M.D.   On: 03/07/2015 12:08    EKG:   Orders placed or performed during the hospital encounter of 03/01/15  . EKG 12-Lead  . EKG 12-Lead      Management plans discussed with the patient, family and they are in agreement.  CODE STATUS:     Code Status Orders        Start     Ordered   03/02/15 0129  Full code   Continuous     03/02/15 0128      TOTAL TIME TAKING CARE OF THIS PATIENT: 40 minutes.    Theodoro Grist M.D on 03/07/2015 at 3:16 PM  Between 7am to 6pm - Pager - [phone]  After 6pm go to [email or web address] - password [email or web address] [facility]  Office  [phone]  CC: Primary care physician; Bobetta Lime, MD

## 2015-03-08 NOTE — Progress Notes (Signed)
Patient discharged home via EMS. Patient ambulates w/o difficulty. Oxygen tank with patient. All belongings with patient. Discharge paperwork given. All questions answered. No needs. PIV removed with cath intact.

## 2015-03-08 NOTE — Care Management (Signed)
EMS arranged to prevent delay of O2 being set up in the home. Patient states that side-roads are still bad enough that her roommate cannot get out of the driveway. She denies anyone else being able to pick her up. I have notified Will with [employer] that patient will being going home today to arrange O2 in the home.

## 2015-03-08 NOTE — Progress Notes (Signed)
Physical Therapy Treatment Patient Details Name: Nicole Austin MRN: [id number] DOB: [birth year] Today's Date: 2015-04-06    History of Present Illness Pt here with prolonged cold-like symptoms and had O2 issues, she also is being seen for bipolar disorder    PT Comments    Pt agreeable to ambulation, however session cut short as EMS arrived for transport home. Pt demonstrates safe technique with gait sequencing, no LOB noted. Pt ambulated on 2L of O2 while keeping sats WNL. Improved progress towards goals with increased functional independence and safety with OOB mobility. No further needs during acute care stay.  Follow Up Recommendations  Home health PT     Equipment Recommendations  None recommended by PT    Recommendations for Other Services       Precautions / Restrictions Precautions Precautions: Fall Precaution Comments: Pt has had 2 falls, reports she had throw rugs removed Restrictions Weight Bearing Restrictions: No    Mobility  Bed Mobility               General bed mobility comments: not performed as pt received sitting at EOB  Transfers Overall transfer level: Independent Equipment used: None             General transfer comment: safe technique during standing with no AD required  Ambulation/Gait Ambulation/Gait assistance: Supervision Ambulation Distance (Feet): 150 Feet Assistive device: None Gait Pattern/deviations: Step-through pattern     General Gait Details: ambulated with reciprocal gait pattern and slow speed. Pt stays near railing on wall, however no LOB noted. Pt ambulated on 2L of O2 with sats at 91%. Slight SOB symptoms noted. Able to complete 10' walk test in 7 seconds indicating slow speed.   Stairs            Wheelchair Mobility    Modified Rankin (Stroke Patients Only)       Balance                                    Cognition Arousal/Alertness: Awake/alert Behavior During Therapy: WFL  for tasks assessed/performed Overall Cognitive Status: Within Functional Limits for tasks assessed                      Exercises      General Comments        Pertinent Vitals/Pain Pain Assessment: No/denies pain    Home Living                      Prior Function            PT Goals (current goals can now be found in the care plan section) Acute Rehab PT Goals Patient Stated Goal: go home PT Goal Formulation: With patient Time For Goal Achievement: 03/18/15 Progress towards PT goals: Progressing toward goals    Frequency  Min 2X/week    PT Plan Discharge plan needs to be updated    Co-evaluation             End of Session Equipment Utilized During Treatment: Gait belt;Oxygen Activity Tolerance: Patient tolerated treatment well Patient left: in bed     Time: 1020-1028 PT Time Calculation (min) (ACUTE ONLY): 8 min  Charges:  $Gait Training: 8-22 mins                    G Codes:      Vercie Pokorny 04-06-15,  11:28 AM  Elizabeth Palau, PT, DPT [phone]

## 2015-03-14 ENCOUNTER — Other Ambulatory Visit: Payer: Self-pay

## 2015-03-14 DIAGNOSIS — J449 Chronic obstructive pulmonary disease, unspecified: Secondary | ICD-10-CM

## 2015-03-14 MED ORDER — ALBUTEROL SULFATE HFA 108 (90 BASE) MCG/ACT IN AERS: 1.0000 | INHALATION_SPRAY | Freq: Four times a day (QID) | RESPIRATORY_TRACT | Status: DC | PRN

## 2015-03-16 ENCOUNTER — Other Ambulatory Visit: Payer: Self-pay

## 2015-03-16 MED ORDER — TRAMADOL HCL 50 MG PO TABS: 50.0000 mg | ORAL_TABLET | Freq: Two times a day (BID) | ORAL | Status: DC

## 2015-03-25 ENCOUNTER — Emergency Department: Payer: Commercial Managed Care - HMO

## 2015-03-25 ENCOUNTER — Encounter: Payer: Self-pay | Admitting: Emergency Medicine

## 2015-03-25 ENCOUNTER — Observation Stay
Admission: EM | Admit: 2015-03-25 | Discharge: 2015-03-28 | Disposition: A | Discharge status: Home / Self Care | Payer: Commercial Managed Care - HMO | Attending: Internal Medicine | Admitting: Internal Medicine

## 2015-03-25 ENCOUNTER — Observation Stay: Payer: Commercial Managed Care - HMO

## 2015-03-25 DIAGNOSIS — Z833 Family history of diabetes mellitus: Secondary | ICD-10-CM | POA: Insufficient documentation

## 2015-03-25 DIAGNOSIS — R4781 Slurred speech: Secondary | ICD-10-CM | POA: Diagnosis not present

## 2015-03-25 DIAGNOSIS — Z811 Family history of alcohol abuse and dependence: Secondary | ICD-10-CM | POA: Insufficient documentation

## 2015-03-25 DIAGNOSIS — G894 Chronic pain syndrome: Secondary | ICD-10-CM

## 2015-03-25 DIAGNOSIS — R600 Localized edema: Secondary | ICD-10-CM | POA: Insufficient documentation

## 2015-03-25 DIAGNOSIS — M1991 Primary osteoarthritis, unspecified site: Secondary | ICD-10-CM | POA: Insufficient documentation

## 2015-03-25 DIAGNOSIS — R479 Unspecified speech disturbances: Secondary | ICD-10-CM | POA: Diagnosis not present

## 2015-03-25 DIAGNOSIS — Z791 Long term (current) use of non-steroidal anti-inflammatories (NSAID): Secondary | ICD-10-CM | POA: Diagnosis not present

## 2015-03-25 DIAGNOSIS — Z8673 Personal history of transient ischemic attack (TIA), and cerebral infarction without residual deficits: Secondary | ICD-10-CM | POA: Diagnosis not present

## 2015-03-25 DIAGNOSIS — R748 Abnormal levels of other serum enzymes: Secondary | ICD-10-CM | POA: Insufficient documentation

## 2015-03-25 DIAGNOSIS — Z96652 Presence of left artificial knee joint: Secondary | ICD-10-CM | POA: Diagnosis not present

## 2015-03-25 DIAGNOSIS — R531 Weakness: Secondary | ICD-10-CM | POA: Diagnosis not present

## 2015-03-25 DIAGNOSIS — Z7952 Long term (current) use of systemic steroids: Secondary | ICD-10-CM | POA: Insufficient documentation

## 2015-03-25 DIAGNOSIS — M79673 Pain in unspecified foot: Secondary | ICD-10-CM | POA: Diagnosis not present

## 2015-03-25 DIAGNOSIS — F17210 Nicotine dependence, cigarettes, uncomplicated: Secondary | ICD-10-CM | POA: Insufficient documentation

## 2015-03-25 DIAGNOSIS — I639 Cerebral infarction, unspecified: Secondary | ICD-10-CM | POA: Diagnosis not present

## 2015-03-25 DIAGNOSIS — Z818 Family history of other mental and behavioral disorders: Secondary | ICD-10-CM | POA: Diagnosis not present

## 2015-03-25 DIAGNOSIS — I252 Old myocardial infarction: Secondary | ICD-10-CM | POA: Diagnosis not present

## 2015-03-25 DIAGNOSIS — R778 Other specified abnormalities of plasma proteins: Secondary | ICD-10-CM

## 2015-03-25 DIAGNOSIS — F319 Bipolar disorder, unspecified: Secondary | ICD-10-CM | POA: Diagnosis not present

## 2015-03-25 DIAGNOSIS — F909 Attention-deficit hyperactivity disorder, unspecified type: Secondary | ICD-10-CM | POA: Diagnosis not present

## 2015-03-25 DIAGNOSIS — Z8249 Family history of ischemic heart disease and other diseases of the circulatory system: Secondary | ICD-10-CM | POA: Insufficient documentation

## 2015-03-25 DIAGNOSIS — J45909 Unspecified asthma, uncomplicated: Secondary | ICD-10-CM | POA: Insufficient documentation

## 2015-03-25 DIAGNOSIS — R200 Anesthesia of skin: Secondary | ICD-10-CM | POA: Insufficient documentation

## 2015-03-25 DIAGNOSIS — R5383 Other fatigue: Secondary | ICD-10-CM | POA: Diagnosis not present

## 2015-03-25 DIAGNOSIS — J449 Chronic obstructive pulmonary disease, unspecified: Secondary | ICD-10-CM | POA: Insufficient documentation

## 2015-03-25 DIAGNOSIS — G5621 Lesion of ulnar nerve, right upper limb: Principal | ICD-10-CM | POA: Insufficient documentation

## 2015-03-25 DIAGNOSIS — R7989 Other specified abnormal findings of blood chemistry: Secondary | ICD-10-CM | POA: Diagnosis not present

## 2015-03-25 DIAGNOSIS — N179 Acute kidney failure, unspecified: Secondary | ICD-10-CM | POA: Diagnosis not present

## 2015-03-25 DIAGNOSIS — Z79899 Other long term (current) drug therapy: Secondary | ICD-10-CM | POA: Insufficient documentation

## 2015-03-25 DIAGNOSIS — R609 Edema, unspecified: Secondary | ICD-10-CM | POA: Diagnosis present

## 2015-03-25 DIAGNOSIS — M7989 Other specified soft tissue disorders: Secondary | ICD-10-CM | POA: Diagnosis not present

## 2015-03-25 DIAGNOSIS — Z885 Allergy status to narcotic agent status: Secondary | ICD-10-CM | POA: Diagnosis not present

## 2015-03-25 DIAGNOSIS — G8929 Other chronic pain: Secondary | ICD-10-CM | POA: Insufficient documentation

## 2015-03-25 DIAGNOSIS — F4310 Post-traumatic stress disorder, unspecified: Secondary | ICD-10-CM | POA: Diagnosis not present

## 2015-03-25 DIAGNOSIS — R404 Transient alteration of awareness: Secondary | ICD-10-CM | POA: Diagnosis not present

## 2015-03-25 LAB — COMPREHENSIVE METABOLIC PANEL
ALK PHOS: 72 U/L (ref 38–126)
ALT: 230 U/L — ABNORMAL HIGH (ref 14–54)
ANION GAP: 9 (ref 5–15)
AST: 161 U/L — ABNORMAL HIGH (ref 15–41)
Albumin: 3.5 g/dL (ref 3.5–5.0)
BUN: 15 mg/dL (ref 6–20)
CALCIUM: 9.1 mg/dL (ref 8.9–10.3)
CO2: 30 mmol/L (ref 22–32)
Chloride: 102 mmol/L (ref 101–111)
Creatinine, Ser: 1.44 mg/dL — ABNORMAL HIGH (ref 0.44–1.00)
GFR calc non Af Amer: 40 mL/min — ABNORMAL LOW (ref >60)
GFR, EST AFRICAN AMERICAN: 46 mL/min — AB (ref >60)
Glucose, Bld: 100 mg/dL — ABNORMAL HIGH (ref 65–99)
Potassium: 4.6 mmol/L (ref 3.5–5.1)
SODIUM: 141 mmol/L (ref 135–145)
TOTAL PROTEIN: 7.8 g/dL (ref 6.5–8.1)
Total Bilirubin: 0.9 mg/dL (ref 0.3–1.2)

## 2015-03-25 LAB — PROTIME-INR
INR: 1
PROTHROMBIN TIME: 13.4 s (ref 11.4–15.0)

## 2015-03-25 LAB — DIFFERENTIAL
Basophils Absolute: 0.1 10*3/uL (ref 0–0.1)
Basophils Relative: 1 %
EOS ABS: 0.1 10*3/uL (ref 0–0.7)
EOS PCT: 1 %
LYMPHS PCT: 18 %
Lymphs Abs: 1.7 10*3/uL (ref 1.0–3.6)
MONO ABS: 1 10*3/uL — AB (ref 0.2–0.9)
Monocytes Relative: 11 %
Neutro Abs: 6.3 10*3/uL (ref 1.4–6.5)
Neutrophils Relative %: 69 %

## 2015-03-25 LAB — CBC
HCT: 42.3 % (ref 35.0–47.0)
Hemoglobin: 13.7 g/dL (ref 12.0–16.0)
MCH: 30.1 pg (ref 26.0–34.0)
MCHC: 32.5 g/dL (ref 32.0–36.0)
MCV: 92.8 fL (ref 80.0–100.0)
PLATELETS: 153 10*3/uL (ref 150–440)
RBC: 4.56 MIL/uL (ref 3.80–5.20)
RDW: 16.6 % — AB (ref 11.5–14.5)
WBC: 9.2 10*3/uL (ref 3.6–11.0)

## 2015-03-25 LAB — TROPONIN I: Troponin I: 0.08 ng/mL — ABNORMAL HIGH (ref <0.031)

## 2015-03-25 LAB — GLUCOSE, CAPILLARY: GLUCOSE-CAPILLARY: 83 mg/dL (ref 65–99)

## 2015-03-25 LAB — VALPROIC ACID LEVEL: VALPROIC ACID LVL: 37 ug/mL — AB (ref 50.0–100.0)

## 2015-03-25 LAB — APTT: aPTT: 27 seconds (ref 24–36)

## 2015-03-25 MED ORDER — ONDANSETRON HCL 4 MG PO TABS: 4.0000 mg | ORAL_TABLET | Freq: Four times a day (QID) | ORAL | Status: DC | PRN

## 2015-03-25 MED ORDER — ASPIRIN 325 MG PO TABS
325.0000 mg | ORAL_TABLET | Freq: Every day | ORAL | Status: DC
  Administered 2015-03-26 – 2015-03-28 (×3): 325 mg via ORAL
  Filled 2015-03-25 (×3): qty 1

## 2015-03-25 MED ORDER — CYCLOBENZAPRINE HCL 10 MG PO TABS: 10.0000 mg | ORAL_TABLET | Freq: Three times a day (TID) | ORAL | Status: DC | PRN

## 2015-03-25 MED ORDER — MELOXICAM 7.5 MG PO TABS
15.0000 mg | ORAL_TABLET | Freq: Every day | ORAL | Status: DC
  Administered 2015-03-26 – 2015-03-28 (×3): 15 mg via ORAL
  Filled 2015-03-25 (×4): qty 2

## 2015-03-25 MED ORDER — QUETIAPINE FUMARATE ER 300 MG PO TB24
300.0000 mg | ORAL_TABLET | Freq: Every day | ORAL | Status: DC
  Administered 2015-03-26: 02:00:00 300 mg via ORAL
  Filled 2015-03-25 (×2): qty 1

## 2015-03-25 MED ORDER — TRAZODONE HCL 100 MG PO TABS
100.0000 mg | ORAL_TABLET | Freq: Two times a day (BID) | ORAL | Status: DC
  Administered 2015-03-26: 02:00:00 100 mg via ORAL
  Filled 2015-03-25 (×2): qty 1

## 2015-03-25 MED ORDER — PREGABALIN 75 MG PO CAPS
200.0000 mg | ORAL_CAPSULE | Freq: Two times a day (BID) | ORAL | Status: DC
  Administered 2015-03-26 (×2): 200 mg via ORAL
  Filled 2015-03-25 (×2): qty 2

## 2015-03-25 MED ORDER — ALBUTEROL SULFATE (2.5 MG/3ML) 0.083% IN NEBU: 3.0000 mL | INHALATION_SOLUTION | Freq: Four times a day (QID) | RESPIRATORY_TRACT | Status: DC | PRN

## 2015-03-25 MED ORDER — ACETAMINOPHEN 325 MG PO TABS
650.0000 mg | ORAL_TABLET | Freq: Four times a day (QID) | ORAL | Status: DC | PRN
  Filled 2015-03-25: qty 2

## 2015-03-25 MED ORDER — ONDANSETRON HCL 4 MG/2ML IJ SOLN: 4.0000 mg | Freq: Four times a day (QID) | INTRAMUSCULAR | Status: DC | PRN

## 2015-03-25 MED ORDER — OXYCODONE HCL 5 MG PO TABS: 5.0000 mg | ORAL_TABLET | ORAL | Status: DC | PRN

## 2015-03-25 MED ORDER — ESCITALOPRAM OXALATE 10 MG PO TABS
20.0000 mg | ORAL_TABLET | ORAL | Status: DC
  Administered 2015-03-26 – 2015-03-28 (×3): 20 mg via ORAL
  Filled 2015-03-25 (×3): qty 2

## 2015-03-25 MED ORDER — GLUCOSAMINE-CHONDROITIN 500-400 MG PO TABS: 1.0000 | ORAL_TABLET | Freq: Two times a day (BID) | ORAL | Status: DC

## 2015-03-25 MED ORDER — SODIUM CHLORIDE 0.9% FLUSH
3.0000 mL | Freq: Two times a day (BID) | INTRAVENOUS | Status: DC
  Administered 2015-03-26 – 2015-03-28 (×5): 3 mL via INTRAVENOUS

## 2015-03-25 MED ORDER — LIDOCAINE 5 % EX OINT: 1.0000 "application " | TOPICAL_OINTMENT | Freq: Every day | CUTANEOUS | Status: DC | PRN

## 2015-03-25 MED ORDER — ADULT MULTIVITAMIN W/MINERALS CH
1.0000 | ORAL_TABLET | Freq: Every day | ORAL | Status: DC
  Administered 2015-03-26 – 2015-03-28 (×3): 1 via ORAL
  Filled 2015-03-25 (×3): qty 1

## 2015-03-25 MED ORDER — DIVALPROEX SODIUM 250 MG PO DR TAB
250.0000 mg | DELAYED_RELEASE_TABLET | Freq: Two times a day (BID) | ORAL | Status: DC
  Administered 2015-03-26 – 2015-03-28 (×6): 250 mg via ORAL
  Filled 2015-03-25 (×6): qty 1

## 2015-03-25 MED ORDER — ALPRAZOLAM 1 MG PO TABS
1.0000 mg | ORAL_TABLET | Freq: Four times a day (QID) | ORAL | Status: DC | PRN
  Administered 2015-03-28: 1 mg via ORAL
  Filled 2015-03-25: qty 1

## 2015-03-25 MED ORDER — IPRATROPIUM-ALBUTEROL 0.5-2.5 (3) MG/3ML IN SOLN: 3.0000 mL | RESPIRATORY_TRACT | Status: DC | PRN

## 2015-03-25 MED ORDER — ATORVASTATIN CALCIUM 20 MG PO TABS
40.0000 mg | ORAL_TABLET | Freq: Every day | ORAL | Status: DC
  Administered 2015-03-26 – 2015-03-27 (×2): 40 mg via ORAL
  Filled 2015-03-25 (×2): qty 2

## 2015-03-25 MED ORDER — TRAMADOL HCL 50 MG PO TABS: 50.0000 mg | ORAL_TABLET | Freq: Two times a day (BID) | ORAL | Status: DC | PRN

## 2015-03-25 MED ORDER — ASPIRIN 300 MG RE SUPP: 300.0000 mg | Freq: Every day | RECTAL | Status: DC

## 2015-03-25 MED ORDER — STROKE: EARLY STAGES OF RECOVERY BOOK
Freq: Once | Status: AC
  Administered 2015-03-26: 02:00:00

## 2015-03-25 MED ORDER — ACETAMINOPHEN 650 MG RE SUPP: 650.0000 mg | Freq: Four times a day (QID) | RECTAL | Status: DC | PRN

## 2015-03-25 NOTE — ED Notes (Signed)
Registration at bedside, pt having difficulty signing name.  When asked how long she has had difficulty writing, pt states couple weeks.

## 2015-03-25 NOTE — ED Notes (Signed)
Called floor to inquire about when bed will be ready, per charge nurse, waiting for nurse to come in

## 2015-03-25 NOTE — Progress Notes (Addendum)
PHARMACIST - PHYSICIAN ORDER COMMUNICATION  CONCERNING: P&T Medication Policy on Herbal Medications  DESCRIPTION:  This patient's order for:  Glucosamine-chondroitin  has been noted.  This product(s) is classified as an "herbal" or natural product. Due to a lack of definitive safety studies or FDA approval, nonstandard manufacturing practices, plus the potential risk of unknown drug-drug interactions while on inpatient medications, the Pharmacy and Therapeutics Committee does not permit the use of "herbal" or natural products of this type within [place].   ACTION TAKEN: The pharmacy department is unable to verify this order at this time  Please reevaluate patient's clinical condition at discharge and address if the herbal or natural product(s) should be resumed at that time.    

## 2015-03-25 NOTE — ED Notes (Signed)
Spoke to patient's sister on the phone at this time.  Sister states patient started having difficulty speaking yesterday evening around 2pm.  Patient's sister states patient was having difficulty holding a cup, not able to walk and was having trouble eating.  Patient's sister states the words she was trying to say weren't making any sense.

## 2015-03-25 NOTE — ED Notes (Signed)
Pt arrived by EMS with c/o of weakness. Per family symptoms started yesterday around 2pm. Pt states both legs feel weak.

## 2015-03-25 NOTE — Progress Notes (Signed)
Chaplain received a code stroke and maitained a presence until the code was released. Minerva Fester [phone]

## 2015-03-25 NOTE — H&P (Signed)
[facility] NAME: [name]    MR#:  [id number]  DATE OF BIRTH:  08-18-1960   DATE OF ADMISSION:  03/25/2015  PRIMARY CARE PHYSICIAN: Bobetta Lime, MD   REQUESTING/REFERRING PHYSICIAN: Kinner  CHIEF COMPLAINT:   Chief Complaint  Patient presents with  . Code Stroke   speech difficulty  HISTORY OF PRESENT ILLNESS:  Nicole Austin  is a 55 y.o. female with a known history of COPD non-oxygen dependent, bipolar type I, anxiety not otherwise specified presenting with one-day duration of altered speech. She describes one day duration of altered speech pattern which she describes difficulty getting words out as well as having difficulty finding the correct words. She denies any numbness or paresthesias, paralysis however she does attests to having bilateral lower extremity weakness is been intermittent for about one week total duration. She is evaluated in the emergency department by tele- neurology who recommended stroke workup.  PAST MEDICAL HISTORY:   Past Medical History  Diagnosis Date  . COPD (chronic obstructive pulmonary disease) ([place])   . PTSD (post-traumatic stress disorder)   . Rhabdomyolysis   . Bipolar 1 disorder ([place])   . Arthritis   . Stroke ([place])   . MI (myocardial infarction) ([place])   . Depression   . ADHD (attention deficit hyperactivity disorder)   . Asthma     PAST SURGICAL HISTORY:   Past Surgical History  Procedure Laterality Date  . Replacement total knee Left     SOCIAL HISTORY:   Social History  Substance Use Topics  . Smoking status: Current Every Day Smoker -- 0.50 packs/day    Types: Cigarettes    Start date: 10/11/1984  . Smokeless tobacco: Never Used  . Alcohol Use: No    FAMILY HISTORY:   Family History  Problem Relation Age of Onset  . Hernia Mother   . Heart disease Mother   . OCD Mother   . Diabetes Mother   . Parkinson's disease Father   . Bipolar disorder  Sister   . Schizophrenia Sister   . ADD / ADHD Sister   . Alcohol abuse Brother   . Bipolar disorder Sister   . Paranoid behavior Sister   . ADD / ADHD Sister     DRUG ALLERGIES:   Allergies  Allergen Reactions  . Codeine Itching    REVIEW OF SYSTEMS:  REVIEW OF SYSTEMS:  CONSTITUTIONAL: Denies fevers, chills, fatigue, weakness.  EYES: Denies blurred vision, double vision, or eye pain.  EARS, NOSE, THROAT: Denies tinnitus, ear pain, hearing loss.  RESPIRATORY: denies cough, shortness of breath, wheezing  CARDIOVASCULAR: Denies chest pain, palpitations, edema.  GASTROINTESTINAL: Denies nausea, vomiting, diarrhea, abdominal pain.  GENITOURINARY: Denies dysuria, hematuria.  ENDOCRINE: Denies nocturia or thyroid problems. HEMATOLOGIC AND LYMPHATIC: Denies easy bruising or bleeding.  SKIN: Denies rash or lesions.  MUSCULOSKELETAL: Denies pain in neck, back, shoulder, knees, hips, or further arthritic symptoms.  NEUROLOGIC: Denies paralysis, paresthesias. Bilateral lower extremity weakness PSYCHIATRIC: Positive anxiety denies depressive symptoms. Otherwise full review of systems performed by me is negative.   MEDICATIONS AT HOME:   Prior to Admission medications   Medication Sig Start Date End Date Taking? Authorizing Provider  albuterol (PROVENTIL HFA;VENTOLIN HFA) 108 (90 Base) MCG/ACT inhaler Inhale 1-2 puffs into the lungs every 6 (six) hours as needed for wheezing or shortness of breath. 03/14/15  Yes Bobetta Lime, MD  ALPRAZolam Duanne Moron) 1 MG tablet Take 1 tablet (1 mg total)  by mouth 4 (four) times daily as needed for anxiety. 01/13/15  Yes Marjie Skiff, MD  cyclobenzaprine (FLEXERIL) 5 MG tablet Take 2 tablets (10 mg total) by mouth 3 (three) times daily as needed for muscle spasms. 11/24/14  Yes Lance Bosch, MD  divalproex (DEPAKOTE) 250 MG DR tablet Take 1 tablet (250 mg total) by mouth 2 (two) times daily. 02/03/15  Yes Marjie Skiff, MD  escitalopram (LEXAPRO)  20 MG tablet Take 20 mg by mouth every other day.   Yes Historical Provider, MD  glucosamine-chondroitin 500-400 MG tablet Take 1 tablet by mouth 2 (two) times daily.   Yes Historical Provider, MD  ipratropium-albuterol (DUONEB) 0.5-2.5 (3) MG/3ML SOLN Take 3 mLs by nebulization every 4 (four) hours as needed. Patient taking differently: Take 3 mLs by nebulization every 4 (four) hours as needed (for wheezing).  03/07/15  Yes Theodoro Grist, MD  lidocaine (XYLOCAINE) 5 % ointment Apply 1 application topically as needed for mild pain.   Yes Historical Provider, MD  LYRICA 200 MG capsule Take 1 capsule (200 mg total) by mouth 2 (two) times daily. 10/04/14  Yes Bobetta Lime, MD  meloxicam (MOBIC) 15 MG tablet Take 1 tablet (15 mg total) by mouth daily. 02/13/15  Yes Charline Bills Cuthriell, PA-C  Multiple Vitamin (MULTIVITAMIN WITH MINERALS) TABS tablet Take 1 tablet by mouth daily.   Yes Historical Provider, MD  QUEtiapine (SEROQUEL XR) 300 MG 24 hr tablet Take 300 mg by mouth at bedtime.   Yes Historical Provider, MD  traMADol (ULTRAM) 50 MG tablet Take 1 tablet (50 mg total) by mouth 2 (two) times daily. Patient taking differently: Take 50 mg by mouth 2 (two) times daily as needed for moderate pain.  03/16/15  Yes Bobetta Lime, MD  traZODone (DESYREL) 100 MG tablet Take 1 tablet (100 mg total) by mouth 2 (two) times daily. 01/12/15  Yes Lance Bosch, MD  naproxen (NAPROSYN) 500 MG tablet Take 1 tablet (500 mg total) by mouth 2 (two) times daily with a meal. Patient not taking: Reported on 03/25/2015 01/26/15   Johnn Hai, PA-C  predniSONE (STERAPRED UNI-PAK 21 TAB) 10 MG (21) TBPK tablet Take 1 tablet (10 mg total) by mouth daily. Please take 6 pills on the day 1 and 2, then 5 pills on day 3 and 4, then taper by 10 mg every 2 days until finished, thank you Patient not taking: Reported on 03/25/2015 03/07/15   Theodoro Grist, MD      VITAL SIGNS:  Blood pressure 129/72, pulse 103, temperature 98.4  F (36.9 C), resp. rate 17, height 5\' 1"  (1.549 m), weight 285 lb (129.275 kg), SpO2 97 %.  PHYSICAL EXAMINATION:  VITAL SIGNS: Filed Vitals:   03/25/15 2100 03/25/15 2130  BP: 126/67 129/72  Pulse: 106 103  Temp:    Resp: 31 17   GENERAL:54 y.o.female currently in no acute distress. Anxious HEAD: Normocephalic, atraumatic.  EYES: Pupils equal, round, reactive to light. Extraocular muscles intact. No scleral icterus.  MOUTH: Moist mucosal membrane. Dentition intact. No abscess noted.  EAR, NOSE, THROAT: Clear without exudates. No external lesions.  NECK: Supple. No thyromegaly. No nodules. No JVD.  PULMONARY: Clear to ascultation, without wheeze rails or rhonci. No use of accessory muscles, Good respiratory effort. good air entry bilaterally CHEST: Nontender to palpation.  CARDIOVASCULAR: S1 and S2. Regular rate and rhythm. No murmurs, rubs, or gallops. 2+ edema left leg greater than right. Pedal pulses 2+ bilaterally.  GASTROINTESTINAL: Soft, nontender,  nondistended. No masses. Positive bowel sounds. No hepatosplenomegaly.  MUSCULOSKELETAL: No swelling, clubbing, or edema. Range of motion full in all extremities.  NEUROLOGIC: Cranial nerves II through XII are intact. Strength 4/5 in all extremities including proximal and distal flexion and extension, pronator drift within normal limits, gait deferred at this time Sensation intact. Reflexes intact. Has aphasic speech pattern SKIN: Erythema posterior left calf No ulceration, lesions, rashes, or cyanosis. Skin warm and dry. Turgor intact.  PSYCHIATRIC: Mood, affect within normal limits. The patient is awake, alert and oriented x 3. Insight, judgment intact.    LABORATORY PANEL:   CBC  Recent Labs Lab 03/25/15 1837  WBC 9.2  HGB 13.7  HCT 42.3  PLT 153   ------------------------------------------------------------------------------------------------------------------  Chemistries   Recent Labs Lab 03/25/15 1837  NA 141  K  4.6  CL 102  CO2 30  GLUCOSE 100*  BUN 15  CREATININE 1.44*  CALCIUM 9.1  AST 161*  ALT 230*  ALKPHOS 72  BILITOT 0.9   ------------------------------------------------------------------------------------------------------------------  Cardiac Enzymes  Recent Labs Lab 03/25/15 1837  TROPONINI 0.08*   ------------------------------------------------------------------------------------------------------------------  RADIOLOGY:  Ct Head Wo Contrast  03/25/2015  CLINICAL DATA:  Left-sided weakness EXAM: CT HEAD WITHOUT CONTRAST TECHNIQUE: Contiguous axial images were obtained from the base of the skull through the vertex without intravenous contrast. COMPARISON:  07/02/2014 FINDINGS: No acute intracranial abnormality. Specifically, no hemorrhage, hydrocephalus, mass lesion, acute infarction, or significant intracranial injury. No acute calvarial abnormality. Visualized paranasal sinuses and mastoids clear. Orbital soft tissues unremarkable. IMPRESSION: Negative noncontrast head CT. Electronically Signed   By: Rolm Baptise M.D.   On: 03/25/2015 17:59    EKG:   Orders placed or performed during the hospital encounter of 03/25/15  . ED EKG  . ED EKG  . EKG 12-Lead  . EKG 12-Lead  . EKG 12-Lead  . EKG 12-Lead    IMPRESSION AND PLAN:   [name] history of bipolar 1, COPD presenting with altered speech  1. Stroke, unspecified: Aspirin statin therapy, place on telemetry, check lipid panel hemoglobin A1c, echocardiogram, carotid Doppler, neuro checks, MRI, permissive hypertension 2. Left lower extremity edema: Given the unilateral swelling will check venous Doppler 3. Elevated troponin: Place on telemetry aspirin statin therapy, trend cardiac enzymes 3 it is possible to have elevated troponin mildly and strokelike symptoms would avoid full anticoagulation given concern for stroke 4. Venous thromboembolism prophylactic: SCDs    All the records are reviewed  and case discussed with ED provider. Management plans discussed with the patient, family and they are in agreement.  CODE STATUS: Full  TOTAL TIME TAKING CARE OF THIS PATIENT: 40 minutes.    Hower,  Karenann Cai.D on 03/25/2015 at 9:33 PM  Between 7am to 6pm - Pager - [phone]  After 6pm: House Pager: - [phone]  [facility]  Office  [phone]  CC: Primary care physician; Bobetta Lime, MD

## 2015-03-25 NOTE — ED Provider Notes (Signed)
[facility] Emergency Department Provider Note  ____________________________________________    I have reviewed the triage vital signs and the nursing notes.   HISTORY  Chief Complaint Left-sided weakness, difficulty speaking   HPI Nicole Austin is a 55 y.o. female who presents with left-sided weakness and difficult speaking which apparently started yesterday evening around 2 PM. This is according to the patient's sister who reports that the patient was unable to hold a cup not able to walk and was having a hard time eating. This is very unusual for the patient apparently. The patient is a poor historian and is having a hard time describing her symptoms.     Past Medical History  Diagnosis Date  . COPD (chronic obstructive pulmonary disease) ([place])   . PTSD (post-traumatic stress disorder)   . Rhabdomyolysis   . Bipolar 1 disorder ([place])   . Arthritis   . Stroke ([place])   . MI (myocardial infarction) ([place])   . Depression   . ADHD (attention deficit hyperactivity disorder)   . Asthma     Patient Active Problem List   Diagnosis Date Noted  . Acute respiratory failure with hypoxia ([place]) 03/07/2015  . COPD exacerbation ([place]) 03/07/2015  . Right lower lobe pneumonia 03/07/2015  . Encephalopathy, metabolic 99991111  . Elevated transaminase level 03/07/2015  . Bipolar I disorder ([place]) 03/03/2015  . Sepsis ([place]) 03/02/2015  . Left hip pain 01/31/2015  . Bipolar I disorder, most recent episode depressed ([place]) 12/22/2014  . Morbid obesity ([place]) 12/22/2014  . Rash and nonspecific skin eruption 11/16/2014  . Burn of finger 09/23/2014  . Anxiety 08/16/2014  . Affective bipolar disorder ([place]) 08/16/2014  . Gout 08/16/2014  . H/O gastrointestinal disease 08/16/2014  . Extreme obesity ([place]) 08/16/2014  . Muscle spasms of both lower extremities 08/16/2014  . Back pain, chronic 08/13/2014  . Low back pain with sciatica 08/04/2014  . Compulsive tobacco  user syndrome 08/04/2014  . Current tobacco use 08/04/2014  . Polysubstance abuse 07/04/2014  . Anxiety, generalized 11/24/2013  . Imbalance 11/24/2013  . Blurred vision 11/24/2013  . Cephalalgia 11/24/2013  . COPD, moderate ([place]) 11/18/2013  . Moderate COPD (chronic obstructive pulmonary disease) ([place]) 11/18/2013  . HPV (human papilloma virus) infection 07/17/2013  . Arthritis of knee, degenerative 07/15/2013  . H/O neoplasm 07/31/2011    Past Surgical History  Procedure Laterality Date  . Replacement total knee Left     Current Outpatient Rx  Name  Route  Sig  Dispense  Refill  . albuterol (PROVENTIL HFA;VENTOLIN HFA) 108 (90 Base) MCG/ACT inhaler   Inhalation   Inhale 1-2 puffs into the lungs every 6 (six) hours as needed for wheezing or shortness of breath.   6.7 g   1   . ALPRAZolam (XANAX) 1 MG tablet   Oral   Take 1 tablet (1 mg total) by mouth 4 (four) times daily as needed for anxiety.   120 tablet   2   . cyclobenzaprine (FLEXERIL) 5 MG tablet   Oral   Take 2 tablets (10 mg total) by mouth 3 (three) times daily as needed for muscle spasms.   90 tablet   5   . divalproex (DEPAKOTE) 250 MG DR tablet   Oral   Take 1 tablet (250 mg total) by mouth 2 (two) times daily.   60 tablet   1   . escitalopram (LEXAPRO) 20 MG tablet   Oral   Take 20 mg by mouth every other day.         Marland Kitchen  glucosamine-chondroitin 500-400 MG tablet   Oral   Take 1 tablet by mouth 2 (two) times daily.         Marland Kitchen ipratropium-albuterol (DUONEB) 0.5-2.5 (3) MG/3ML SOLN   Nebulization   Take 3 mLs by nebulization every 4 (four) hours as needed. Patient taking differently: Take 3 mLs by nebulization every 4 (four) hours as needed (for wheezing).    360 mL   6   . lidocaine (XYLOCAINE) 5 % ointment   Topical   Apply 1 application topically as needed for mild pain.         Marland Kitchen LYRICA 200 MG capsule   Oral   Take 1 capsule (200 mg total) by mouth 2 (two) times daily.   60  capsule   5     Dispense as written.   . meloxicam (MOBIC) 15 MG tablet   Oral   Take 1 tablet (15 mg total) by mouth daily.   30 tablet   0   . Multiple Vitamin (MULTIVITAMIN WITH MINERALS) TABS tablet   Oral   Take 1 tablet by mouth daily.         . QUEtiapine (SEROQUEL XR) 300 MG 24 hr tablet   Oral   Take 300 mg by mouth at bedtime.         . traMADol (ULTRAM) 50 MG tablet   Oral   Take 1 tablet (50 mg total) by mouth 2 (two) times daily. Patient taking differently: Take 50 mg by mouth 2 (two) times daily as needed for moderate pain.    60 tablet   0     Call in to [facility], refill 03/15/16, [phone]   . traZODone (DESYREL) 100 MG tablet   Oral   Take 1 tablet (100 mg total) by mouth 2 (two) times daily.   60 tablet   1   . naproxen (NAPROSYN) 500 MG tablet   Oral   Take 1 tablet (500 mg total) by mouth 2 (two) times daily with a meal. Patient not taking: Reported on 03/25/2015   30 tablet   0   . predniSONE (STERAPRED UNI-PAK 21 TAB) 10 MG (21) TBPK tablet   Oral   Take 1 tablet (10 mg total) by mouth daily. Please take 6 pills on the day 1 and 2, then 5 pills on day 3 and 4, then taper by 10 mg every 2 days until finished, thank you Patient not taking: Reported on 03/25/2015   42 tablet   0     Allergies Codeine  Family History  Problem Relation Age of Onset  . Hernia Mother   . Heart disease Mother   . OCD Mother   . Diabetes Mother   . Parkinson's disease Father   . Bipolar disorder Sister   . Schizophrenia Sister   . ADD / ADHD Sister   . Alcohol abuse Brother   . Bipolar disorder Sister   . Paranoid behavior Sister   . ADD / ADHD Sister     Social History Social History  Substance Use Topics  . Smoking status: Current Every Day Smoker -- 0.50 packs/day    Types: Cigarettes    Start date: 10/11/1984  . Smokeless tobacco: Never Used  . Alcohol Use: No    Review of Systems  Constitutional: Negative for fever. Eyes: Negative  for visual changes. ENT: Negative for sore throat Cardiovascular: Negative for chest pain. Respiratory: Negative for shortness of breath. Gastrointestinal: Negative for abdominal pain, vomiting and diarrhea. Genitourinary:  Negative for dysuria. Musculoskeletal: Negative for back pain. Skin: Negative for rash. Neurological: Negative for headaches, difficulty speaking Psychiatric: Positive for anxiety    ____________________________________________   PHYSICAL EXAM:  VITAL SIGNS: ED Triage Vitals  Enc Vitals Group     BP 03/25/15 1713 118/75 mmHg     Pulse Rate 03/25/15 1713 103     Resp 03/25/15 1713 18     Temp 03/25/15 1713 98.4 F (36.9 C)     Temp src --      SpO2 03/25/15 1713 90 %     Weight 03/25/15 1713 285 lb (129.275 kg)     Height 03/25/15 1713 5\' 1"  (1.549 m)     Head Cir --      Peak Flow --      Pain Score 03/25/15 1811 0     Pain Loc --      Pain Edu? --      Excl. in [place]? --      Constitutional: Alert and oriented. Anxious and unkempt Eyes: Conjunctivae are normal.  ENT   Head: Normocephalic and atraumatic.   Mouth/Throat: Mucous membranes are moist. Cardiovascular: Normal rate, regular rhythm. Normal and symmetric distal pulses are present in all extremities.  Respiratory: Normal respiratory effort without tachypnea nor retractions. Breath sounds are clear and equal bilaterally.  Gastrointestinal: Soft and non-tender in all quadrants. No distention. There is no CVA tenderness. Genitourinary: deferred Musculoskeletal: Patient appears to move all extremity is equally Neurologic:  Speech is abnormal and patient is difficult to understand. No gross extremity focal neurologic deficits are appreciated. Skin:  Skin is warm, dry and intact. No rash noted. Psychiatric: Mood and affect are normal. Patient exhibits appropriate insight and judgment.  ____________________________________________    LABS (pertinent positives/negatives)  Labs Reviewed   CBC - Abnormal; Notable for the following:    RDW 16.6 (*)    All other components within normal limits  DIFFERENTIAL - Abnormal; Notable for the following:    Monocytes Absolute 1.0 (*)    All other components within normal limits  COMPREHENSIVE METABOLIC PANEL - Abnormal; Notable for the following:    Glucose, Bld 100 (*)    Creatinine, Ser 1.44 (*)    AST 161 (*)    ALT 230 (*)    GFR calc non Af Amer 40 (*)    GFR calc Af Amer 46 (*)    All other components within normal limits  TROPONIN I - Abnormal; Notable for the following:    Troponin I 0.08 (*)    All other components within normal limits  PROTIME-INR  APTT  GLUCOSE, CAPILLARY  CBG MONITORING, ED    ____________________________________________   EKG  ED ECG REPORT I, Lavonia Drafts, the attending physician, personally viewed and interpreted this ECG.  Date: 03/25/2015 EKG Time: 6:13 PM Rate: 104 Rhythm: Sinus tachycardia QRS Axis: normal Intervals: normal ST/T Wave abnormalities: normal Conduction Disturbances: none Narrative Interpretation: unremarkable   ____________________________________________    RADIOLOGY I have personally reviewed any xrays that were ordered on this patient: Negative head CT   ____________________________________________   PROCEDURES  Procedure(s) performed: none  Critical Care performed: none  ____________________________________________   INITIAL IMPRESSION / ASSESSMENT AND PLAN / ED COURSE  Pertinent labs & imaging results that were available during my care of the patient were reviewed by me and considered in my medical decision making (see chart for details).  Patient presented with difficult speaking and possible left-sided weakness. Code stroke called by triage nurse.  Patient seen by neurologist who does not recommend TPA as she is outside the window but does recommend admission for MRI as well. Patient has an elevated troponin as well of unclear  significance, her EKG is unremarkable. This will need to be trended.  ____________________________________________   FINAL CLINICAL IMPRESSION(S) / ED DIAGNOSES  Final diagnoses:  Cerebral infarction due to unspecified mechanism  Elevated troponin     Lavonia Drafts, MD 03/25/15 2113

## 2015-03-25 NOTE — ED Notes (Signed)
Pt taken to US

## 2015-03-25 NOTE — ED Notes (Signed)
Multiple RNs at bedside to attempt IV placement

## 2015-03-25 NOTE — ED Notes (Signed)
Pt's o2sat in low 90s, 2L o2 via [place] applied

## 2015-03-25 NOTE — ED Notes (Signed)
Patient presents to the ED with left sided weakness, difficulty speaking and uneven smile.  Patient was dropped of by [facility] with a complaint of leg pain.  Patient is having difficulty answering questions and is unable to explain the time of onset.  This RN attempted to call sister to know a time of onset.

## 2015-03-26 ENCOUNTER — Observation Stay: Payer: Commercial Managed Care - HMO

## 2015-03-26 ENCOUNTER — Observation Stay
Admit: 2015-03-26 | Discharge: 2015-03-26 | Disposition: A | Discharge status: Home / Self Care | Payer: Commercial Managed Care - HMO | Attending: Internal Medicine | Admitting: Internal Medicine

## 2015-03-26 DIAGNOSIS — R7989 Other specified abnormal findings of blood chemistry: Secondary | ICD-10-CM | POA: Diagnosis not present

## 2015-03-26 DIAGNOSIS — I6359 Cerebral infarction due to unspecified occlusion or stenosis of other cerebral artery: Secondary | ICD-10-CM | POA: Diagnosis not present

## 2015-03-26 DIAGNOSIS — G5621 Lesion of ulnar nerve, right upper limb: Secondary | ICD-10-CM | POA: Diagnosis not present

## 2015-03-26 DIAGNOSIS — R600 Localized edema: Secondary | ICD-10-CM | POA: Diagnosis not present

## 2015-03-26 DIAGNOSIS — M6289 Other specified disorders of muscle: Secondary | ICD-10-CM | POA: Diagnosis not present

## 2015-03-26 DIAGNOSIS — I639 Cerebral infarction, unspecified: Secondary | ICD-10-CM

## 2015-03-26 DIAGNOSIS — N179 Acute kidney failure, unspecified: Secondary | ICD-10-CM | POA: Diagnosis not present

## 2015-03-26 DIAGNOSIS — R5383 Other fatigue: Secondary | ICD-10-CM | POA: Diagnosis not present

## 2015-03-26 DIAGNOSIS — R471 Dysarthria and anarthria: Secondary | ICD-10-CM | POA: Diagnosis not present

## 2015-03-26 LAB — HEMOGLOBIN A1C: Hgb A1c MFr Bld: 5.6 % (ref 4.0–6.0)

## 2015-03-26 LAB — LIPID PANEL
Cholesterol: 160 mg/dL (ref 0–200)
HDL: 28 mg/dL — ABNORMAL LOW (ref >40)
LDL CALC: 102 mg/dL — AB (ref 0–99)
Total CHOL/HDL Ratio: 5.7 RATIO
Triglycerides: 152 mg/dL — ABNORMAL HIGH (ref <150)
VLDL: 30 mg/dL (ref 0–40)

## 2015-03-26 LAB — TROPONIN I
TROPONIN I: 0.04 ng/mL — AB (ref <0.031)
TROPONIN I: 0.05 ng/mL — AB (ref <0.031)

## 2015-03-26 NOTE — Evaluation (Signed)
Physical Therapy Evaluation Patient Details Name: Nicole Austin MRN: [id number] DOB: [birth year] Today's Date: 03/26/2015   History of Present Illness  Nicole Austin is a 55 y.o. female with a known history of COPD non-oxygen dependent, bipolar type I, anxiety not otherwise specified presenting with one-day duration of altered speech. She describes one day duration of altered speech pattern which she describes difficulty getting words out as well as having difficulty finding the correct words. She denies any numbness or paresthesias, paralysis however she does attests to having bilateral lower extremity weakness is been intermittent for about one week total duration. She is evaluated in the emergency department by tele- neurology who recommended stroke workup. Pt is lethargic and drowsy during evaluation. Eyes remain closed for significant portion of evaluation. Pt is AOx3 but with multiple incorrect answers before answering questions correctly. Does not appear to have a good understanding of why she is in the hospital. Multiple aspects of history are obtained from medical record  Clinical Impression  Pt is very lethargic and somnolent throughout assessment. She keeps her eyes closed for a large portion of the exam. RN present to observe most of neurological testing. Pt does present with slurred speech and L sided facial drooop which is deviation from her baseline. Positive pronator drift on LUE. However strength testing is inconsistent and does not correlate with observed functional mobility. For example pt unable to perform L hip flexion in sitting during MMT but then able to flex L hip to return to bed and perform SLR. Decreased R grip strength. Attempted finger follow with patient which she complains of not being able to see the finger and does not complete appropriately. As stated pt is lethargic and appears somewhat confused at times during evaluation. She is disoriented to situation and takes  multiple tries to provide date and location. Pt is tearful and frustrated at times during evaluation. She is complaining of L foot/ankle pain with light touch and in standing. Unable to march in standing due to LE weakness/pain and unable to ambulate at this time. This is a gross deviation from her baseline mobility which she demonstrated just a few weeks ago while admitted. Currently pt will need SNF placement at discharge to return to prior level of function. Pt will benefit from skilled PT services to address deficits in strength, balance, and mobility in order to return to full function at home.    Follow Up Recommendations SNF    Equipment Recommendations  None recommended by PT    Recommendations for Other Services Rehab consult     Precautions / Restrictions Precautions Precautions: Fall Restrictions Weight Bearing Restrictions: No      Mobility  Bed Mobility Overal bed mobility: Needs Assistance Bed Mobility: Supine to Sit     Supine to sit: Min assist Sit to supine: Min assist   General bed mobility comments: Pt able to roll to right side but requires assist to come to sitting and to scoot toward EOB  Transfers Overall transfer level: Needs assistance Equipment used: Rolling walker (2 wheeled) Transfers: Sit to/from Stand Sit to Stand: Min assist         General transfer comment: Pt requires 2 attempts to come to standing. She reports LE weakness and maintains her eyes closed throughout most of transfer. Once upright pt reports increase in L foot/ankle pain. Attempted stand marches at EOB however pt unable to clear either foot from floor. RN notified of bedpan only for toileting  Ambulation/Gait  General Gait Details: Pt unable to ambulate at this time due to weakness and LLE pain in standing  Stairs            Wheelchair Mobility    Modified Rankin (Stroke Patients Only)       Balance Overall balance assessment: Needs  assistance Sitting-balance support: No upper extremity supported Sitting balance-Leahy Scale: Good     Standing balance support: Bilateral upper extremity supported Standing balance-Leahy Scale: Poor                               Pertinent Vitals/Pain Pain Assessment: No/denies pain (Denies pain initially, reports L ankle/heel pain w/ movement)    Home Living Family/patient expects to be discharged to:: Private residence Living Arrangements: Alone Available Help at Discharge: Home health;Personal care attendant Type of Home: Apartment Home Access: Elevator     Home Layout: One level Home Equipment: Cane - single point;Walker - 4 wheels;Walker - 2 wheels;Walker - standard      Prior Function Level of Independence: Needs assistance   Gait / Transfers Assistance Needed: Pt reports she use single point cane for ambulation  ADL's / Homemaking Assistance Needed: Pt states she is independent with ADLs but has assist with IADLs. Does not drive        Hand Dominance   Dominant Hand: Right    Extremity/Trunk Assessment   Upper Extremity Assessment: LUE deficits/detail       LUE Deficits / Details: Pt present with positive pronator drift in LUE however MMT of LUE appears symmetrical to R side. Poor effort on both sides and lethargy noted. Decreased R grip strength   Lower Extremity Assessment: LLE deficits/detail   LLE Deficits / Details: Inconsistent performance with testing. Pt will not tolerate pressure of MMT on bilateral LE. Unable to flex L hip against gravity in sitting however when returning to bed she lifts her leg to return to bed. Able to perform SLR against gravity inconsistently. States she cannot lift legs to perform heel to shin testing. Reports intact sensation to light touch. Pt reports increase in L ankle/heel pain with light touch by RN while donning socks     Communication   Communication: No difficulties (Speech is slurred)  Cognition  Arousal/Alertness: Lethargic;Suspect due to medications Behavior During Therapy: Flat affect Overall Cognitive Status: Within Functional Limits for tasks assessed       Memory: Decreased short-term memory              General Comments      Exercises        Assessment/Plan    PT Assessment Patient needs continued PT services  PT Diagnosis Difficulty walking;Generalized weakness   PT Problem List Decreased strength;Decreased activity tolerance;Decreased mobility;Decreased balance;Decreased cognition;Decreased safety awareness  PT Treatment Interventions Gait training;Therapeutic exercise;Balance training;Functional mobility training;Therapeutic activities;Neuromuscular re-education;Cognitive remediation;Patient/family education   PT Goals (Current goals can be found in the Care Plan section) Acute Rehab PT Goals Patient Stated Goal: Return to prior functioning at home. Pt tearful, frustrated and lethargic during assessment PT Goal Formulation: With patient Time For Goal Achievement: 04/09/15 Potential to Achieve Goals: Good    Frequency 7X/week   Barriers to discharge Decreased caregiver support Lives alone    Co-evaluation               End of Session Equipment Utilized During Treatment: Gait belt Activity Tolerance: Patient limited by lethargy Patient left: in bed;with call bell/phone  within reach;with bed alarm set;with nursing/sitter in room Nurse Communication: Mobility status;Other (comment) (Inconsistent examination)    Functional Assessment Tool Used: clinical judgement Functional Limitation: Mobility: Walking and moving around Mobility: Walking and Moving Around Current Status [phone]): At least 60 percent but less than 80 percent impaired, limited or restricted Mobility: Walking and Moving Around Goal Status [phone]): At least 20 percent but less than 40 percent impaired, limited or restricted    Time: 1007-1032 PT Time Calculation (min) (ACUTE  ONLY): 25 min   Charges:   PT Evaluation $PT Eval Moderate Complexity: 1 Procedure PT Treatments $Therapeutic Activity: 8-22 mins   PT G Codes:   PT G-Codes **NOT FOR INPATIENT CLASS** Functional Assessment Tool Used: clinical judgement Functional Limitation: Mobility: Walking and moving around Mobility: Walking and Moving Around Current Status ([id number]): At least 60 percent but less than 80 percent impaired, limited or restricted Mobility: Walking and Moving Around Goal Status [phone]): At least 20 percent but less than 40 percent impaired, limited or restricted   Lynnea Maizes PT, DPT   Rubby Barbary 03/26/2015, 12:06 PM

## 2015-03-26 NOTE — Plan of Care (Signed)
Pt admitted w/Left sided weakness and altered stroke to r/o stroke.  CT, MRI negative for stroke.  Carotid doppler "showed min amt of atherosclerotic plaque L>R - not resulting in a hemodynamically significant stenosis". Pt does exhibit L side facial droop.  Was very somnolent first part of shift and confused w/regard to time and place.  NIHSS was 6 but PT and I witnessed inconsistent findings.  She couldn't move left leg one minute but could the next. Pt had cognitive difficulties - could count to 10 but when asked days of the week, she started to count to 10 again.  Pt acts as if she's overmedicated.  Was given trazodone and seroquel last night.  Per dr, the trazodone is d/ced.   Spoke to pt's sister, w/whom she's been living since she was d/ced  From here two weeks ago.  She also sd that pt isn't on trazodone any longer.  Stated that pt has been "talking crazy" for last year and having hallucinations, but seems to have increased lately.  She also states that pt hasn't had meds for past 2 days, but also wonders if pt has been taking too much medication before that.

## 2015-03-26 NOTE — Progress Notes (Signed)
[place] [facility] [place] NAME: Nicole Austin    MR#:  [id number]  DATE OF BIRTH:  1960-04-23  SUBJECTIVE:  CHIEF COMPLAINT:  Patient is very lethargic during my examination. Arousable but falling asleep during conversation. Patient comes with left-sided weakness but now reporting right-sided weakness as well  REVIEW OF SYSTEMS:  CONSTITUTIONAL: Denies fevers, chills, fatigue, weakness.  EYES: Denies blurred vision, double vision, or eye pain.  EARS, NOSE, THROAT: Denies tinnitus, ear pain, hearing loss.  RESPIRATORY: denies cough, shortness of breath, wheezing  CARDIOVASCULAR: Denies chest pain, palpitations, edema.  GASTROINTESTINAL: Denies nausea, vomiting, diarrhea, abdominal pain.  GENITOURINARY: Denies dysuria, hematuria.  ENDOCRINE: Denies nocturia or thyroid problems. HEMATOLOGIC AND LYMPHATIC: Denies easy bruising or bleeding.  SKIN: Denies rash or lesions.  MUSCULOSKELETAL: Denies pain in neck, back, shoulder, knees, hips, or further arthritic symptoms.  NEUROLOGIC:  Bilateral lower extremity weakness PSYCHIATRIC: Positive anxiety denies depressive symptoms.  DRUG ALLERGIES:   Allergies  Allergen Reactions  . Codeine Itching    VITALS:  Blood pressure 103/62, pulse 92, temperature 97.8 F (36.6 C), temperature source Oral, resp. rate 18, height 5\' 1"  (1.549 m), weight 114.669 kg (252 lb 12.8 oz), SpO2 100 %.  PHYSICAL EXAMINATION:  GENERAL:  55 y.o.-year-old patient lying in the bed with no acute distress.  EYES: Pupils equal, round, reactive to light and accommodation. No scleral icterus.  HEENT: Head atraumatic, normocephalic. Oropharynx and nasopharynx clear.  NECK:  Supple, no jugular venous distention. No thyroid enlargement, no tenderness.  LUNGS: Normal breath sounds bilaterally, no wheezing, rales,rhonchi or crepitation. No use of accessory muscles of respiration.  CARDIOVASCULAR: S1, S2 normal. No  murmurs, rubs, or gallops.  ABDOMEN: Soft, nontender, nondistended. Bowel sounds present. No organomegaly or mass.  EXTREMITIES: No pedal edema, cyanosis, or clubbing.  NEUROLOGIC: Motor left upper extremity and left lower extremity 3 out of 5 Right upper and right lower extremity 4 out of 5  Sensation intact. Gait not checked.  PSYCHIATRIC: The patient is lethargic but arousable and answers questions  SKIN: No obvious rash, lesion, or ulcer.    LABORATORY PANEL:   CBC  Recent Labs Lab 03/25/15 1837  WBC 9.2  HGB 13.7  HCT 42.3  PLT 153   ------------------------------------------------------------------------------------------------------------------  Chemistries   Recent Labs Lab 03/25/15 1837  NA 141  K 4.6  CL 102  CO2 30  GLUCOSE 100*  BUN 15  CREATININE 1.44*  CALCIUM 9.1  AST 161*  ALT 230*  ALKPHOS 72  BILITOT 0.9   ------------------------------------------------------------------------------------------------------------------  Cardiac Enzymes  Recent Labs Lab 03/25/15 1837  TROPONINI 0.08*   ------------------------------------------------------------------------------------------------------------------  RADIOLOGY:  Ct Head Wo Contrast  03/25/2015  CLINICAL DATA:  Left-sided weakness EXAM: CT HEAD WITHOUT CONTRAST TECHNIQUE: Contiguous axial images were obtained from the base of the skull through the vertex without intravenous contrast. COMPARISON:  07/02/2014 FINDINGS: No acute intracranial abnormality. Specifically, no hemorrhage, hydrocephalus, mass lesion, acute infarction, or significant intracranial injury. No acute calvarial abnormality. Visualized paranasal sinuses and mastoids clear. Orbital soft tissues unremarkable. IMPRESSION: Negative noncontrast head CT. Electronically Signed   By: Rolm Baptise M.D.   On: 03/25/2015 17:59   Mr Brain Wo Contrast  03/26/2015  CLINICAL DATA:  One day duration of speech disturbance. Difficulty with word  finding. EXAM: MRI HEAD WITHOUT CONTRAST TECHNIQUE: Multiplanar, multiecho pulse sequences of the brain and surrounding structures were obtained without intravenous contrast. COMPARISON:  Head CT 03/25/2015.  MRI 07/02/2014. FINDINGS:  Diffusion imaging does not show any acute or subacute infarction. The brainstem and cerebellum are normal. The cerebral hemispheres show mild atrophy but no evidence of focal small or large vessel infarction. No mass lesion, hemorrhage, hydrocephalus or extra-axial collection. No pituitary mass. No inflammatory sinus disease. No skull or skullbase lesion. Major vessels at the base of the brain show flow. IMPRESSION: No acute or reversible finding. Mild brain volume loss. Otherwise normal. Electronically Signed   By: Nelson Chimes M.D.   On: 03/26/2015 13:56   US Carotid Bilateral  03/26/2015  CLINICAL DATA:  Stroke and visual disturbance.  History of smoking. EXAM: BILATERAL CAROTID DUPLEX ULTRASOUND TECHNIQUE: Pearline Cables scale imaging, color Doppler and duplex ultrasound were performed of bilateral carotid and vertebral arteries in the neck. COMPARISON:  Carotid Doppler ultrasound - 07/02/2014 FINDINGS: Criteria: Quantification of carotid stenosis is based on velocity parameters that correlate the residual internal carotid diameter with NASCET-based stenosis levels, using the diameter of the distal internal carotid lumen as the denominator for stenosis measurement. The following velocity measurements were obtained: RIGHT ICA:  107/34 cm/sec CCA:  [id number] cm/sec SYSTOLIC ICA/CCA RATIO:  0.6 DIASTOLIC ICA/CCA RATIO:  1.0 ECA:  159 cm/sec LEFT ICA:  160/40 cm/sec CCA:  [id number] cm/sec SYSTOLIC ICA/CCA RATIO:  1.0 DIASTOLIC ICA/CCA RATIO:  1.0 ECA:  117 cm/sec RIGHT CAROTID ARTERY: There is a very minimal amount of intimal thickening involving the right carotid bulb (Image 16), extending to involve the origin and proximal aspect the right internal carotid artery (image 23), morphologically  similar to the 06/2014 examination and not definitely resulting in a hemodynamically significant stenosis. Borderline elevated peak systolic velocities within the mid and distal aspects of the right internal carotid artery felt to be factitious Lee elevated due to sampling at a location of tortuous flow. RIGHT VERTEBRAL ARTERY:  Antegrade flow LEFT CAROTID ARTERY: There is a minimal amount of intimal thickening and eccentric hypoechoic plaque involving the origin and proximal aspects of the left internal carotid artery (image 56), morphologically similar to the 06/2014 examination and not definitely resulting in elevated peak systolic velocities within the left internal carotid artery. Borderline elevated peak systolic velocities throughout the left internal carotid artery are transmitted from the common carotid artery with preservation of the systolic and diastolic ICA/CCA ratios. LEFT VERTEBRAL ARTERY:  Antegrade flow IMPRESSION: Minimal amount of bilateral atherosclerotic plaque and intimal wall thickening, left greater than right, not resulting in a hemodynamically significant stenosis within either internal carotid artery. Electronically Signed   By: Sandi Mariscal M.D.   On: 03/26/2015 09:22   US Venous Img Lower Unilateral Left  03/25/2015  CLINICAL DATA:  Acute onset of left leg swelling. Initial encounter. EXAM: LEFT LOWER EXTREMITY VENOUS DOPPLER ULTRASOUND TECHNIQUE: Gray-scale sonography with graded compression, as well as color Doppler and duplex ultrasound were performed to evaluate the lower extremity deep venous systems from the level of the common femoral vein and including the common femoral, femoral, profunda femoral, popliteal and calf veins including the posterior tibial, peroneal and gastrocnemius veins when visible. The superficial great saphenous vein was also interrogated. Spectral Doppler was utilized to evaluate flow at rest and with distal augmentation maneuvers in the common femoral,  femoral and popliteal veins. COMPARISON:  None. FINDINGS: Contralateral Common Femoral Vein: Respiratory phasicity is normal and symmetric with the symptomatic side. No evidence of thrombus. Normal compressibility. Common Femoral Vein: No evidence of thrombus. Normal compressibility, respiratory phasicity and response to augmentation. Saphenofemoral Junction: No evidence of thrombus. Normal  compressibility and flow on color Doppler imaging. Profunda Femoral Vein: No evidence of thrombus. Normal compressibility and flow on color Doppler imaging. Femoral Vein: No evidence of thrombus. Normal compressibility, respiratory phasicity and response to augmentation. Popliteal Vein: No evidence of thrombus. Normal compressibility, respiratory phasicity and response to augmentation. Calf Veins: Not well seen on this study. Superficial Great Saphenous Vein: No evidence of thrombus. Normal compressibility and flow on color Doppler imaging. Venous Reflux:  None. Other Findings:  None. IMPRESSION: No evidence of deep venous thrombosis. Electronically Signed   By: Garald Balding M.D.   On: 03/25/2015 23:12    EKG:   Orders placed or performed during the hospital encounter of 03/25/15  . ED EKG  . ED EKG  . EKG 12-Lead  . EKG 12-Lead  . EKG 12-Lead  . EKG 12-Lead    ASSESSMENT AND PLAN:   [name] history of bipolar 1, COPD presenting with altered speech  1. Stroke, unspecified:  Continue Aspirin and statin therapy MRA of the brain is negative. Echocardiogram is done and results are pending. Carotid Dopplers with minimal atherosclerosis which is not significant  on telemetry,   lipid panel-LDL 102, on statin  hemoglobin A1c 5.6. Ruled out diabetes Allow permissive hypertension Follow-up with neurology   Therapy has recommended mechanical soft diet  Physical therapy has recommended SNf Patient is lethargic . holding Seroquel and Lyrica tonight until patient is more awake and alert ,  discontinue trazodone as patient is not on trazodone at home   2. Left lower extremity edema: Given the unilateral swelling  Negative DVT on  venous Doppler  3. Elevated troponin: Place on telemetry aspirin statin therapy,  Patient is asymptomatic  trend cardiac enzymes 3 it is possible to have elevated troponin mildly and strokelike symptoms would avoid full anticoagulation given concern for stroke  4. Venous thromboembolism prophylactic: SCDs      All the records are reviewed and case discussed with Care Management/Social Workerr. Management plans discussed with the patient, family and they are in agreement.  CODE STATUS: Full code  TOTAL TIME TAKING CARE OF THIS PATIENT: 35  minutes.   POSSIBLE D/C IN 1-2  DAYS, DEPENDING ON CLINICAL CONDITION.   Nicholes Mango M.D on 03/26/2015 at 4:13 PM  Between 7am to 6pm - Pager - [phone] After 6pm go to [email or web address] - password [email or web address] [facility]  Office  [phone]  CC: Primary care physician; Bobetta Lime, MD

## 2015-03-26 NOTE — Evaluation (Signed)
Occupational Therapy Evaluation Patient Details Name: Nicole Austin MRN: [id number] DOB: [birth year] Today's Date: 03/26/2015    History of Present Illness Patient is a 55 yo old female with a history of COPD, denies use of oxygen at home, bipolar type 1, anxiety.  She reports living at home in an apartment alone, however, nursing reports patient has been staying at her sisters house more recently.  Patient states her sister felt she had a blank stare, had difficulty speaking and called EMS.  She was admitted to [facility] to rule out CVA.  She reports she had been independent prior to admission but also had home health services almost daily to assist with housekeeping and some self care tasks.  Patient appears to be a poor historian.    Clinical Impression   Patient was seen for OT evaluation this date following admission for CVA.  She was lethargic this am however more alert during lunch time hour.  She reports living alone in an apartment with home health care coming almost daily.  She had been recently staying with her sister who helped with medication management.  She was crying upon therapist arrival and stated "I don't know what is wrong with me, I can't hold my spoon to feed myself."  She was attempting to use spoon in right hand but did not have grip sufficient to hold.  She presents with inconsistent symptoms with comparison to left/right.  Appears to have some left facial droop and pronator drift but decreased grip and functional use of right hand for self feeding and grooming tasks.  Added built up red foam to spoon handle and patient was able to bring it hand to mouth.  She did require moderate assistance to load spoon with food and cues provided to be successful.  She presents with decreased overall strength and coordination of BUEs limiting her ability to perform self care tasks as well as decreased transfers and functional mobility.  Cognitive status may need to be further assessed, she  did demonstrate decreased attention to task and difficulty processing information at times. She would benefit from skilled OT to further address her limitations and improve her independence in basic self care tasks.  She will likely require short term rehab based on her performance this date. Tech arrived to transport patient to MRI at the end of the session. Discussed findings of eval with nursing as well as the need for built up foam for self feeding.    Follow Up Recommendations  SNF    Equipment Recommendations       Recommendations for Other Services       Precautions / Restrictions Precautions Precautions: Fall Precaution Comments: Pt has had 2 falls, reports she had throw rugs removed Restrictions Weight Bearing Restrictions: No      Mobility   Balance Overall balance assessment: Needs assistance Sitting-balance support: No upper extremity supported Sitting balance-Leahy Scale: Good     Standing balance support: Bilateral upper extremity supported Standing balance-Leahy Scale: Poor  per PT                            ADL Overall ADL's : Needs assistance/impaired Eating/Feeding: Moderate assistance Eating/Feeding Details (indicate cue type and reason): Patient demonstrated difficulty with grasping utensils, provided built up foam for spoon which improved performance however, she still had difficulty at times loading the spoon but was then able to bring it to mouth, increased spillage noted.  Attempted to use  left hand but reported it was awkward as well.  Grooming: Moderate assistance   Upper Body Bathing: Moderate assistance   Lower Body Bathing: Maximal assistance   Upper Body Dressing : Moderate assistance   Lower Body Dressing: Maximal assistance     Toilet Transfer Details (indicate cue type and reason): Did not perform this date, anticipate patient may need 1-2 people to attempt.            General ADL Comments: Patient was taken down to MRI  at the end of the session this date. Discussed with nursing findings of evaluation.     Vision Additional Comments: Difficult to assess tihs date, will need to look at in future sessions.   Perception     Praxis      Pertinent Vitals/Pain Pain Assessment: 0-10 Pain Score: 2  Pain Location: bilateral feet Pain Descriptors / Indicators: Aching     Hand Dominance Right   Extremity/Trunk Assessment Upper Extremity Assessment Upper Extremity Assessment: Generalized weakness;RUE deficits/detail;LUE deficits/detail (Shoulder flexion to 90 degrees bilaterally) RUE Deficits / Details: Patient was attempting to feed herself upon therapist arrival and was crying because she could not hold the spoon or fork in her right hand and coordinate the movements.  Attempted with left hand and had increased spillage.   RUE Coordination: decreased fine motor;decreased gross motor LUE Deficits / Details: Decreased functional use of left hand but was symmetrical as far as motion at the shoulder and hands.   LUE Sensation:  (reports intact sensation to light touch) LUE Coordination: decreased fine motor;decreased gross motor   Lower Extremity Assessment Lower Extremity Assessment: Defer to PT evaluation LLE Deficits / Details: Inconsistent performance with testing. Pt will not tolerate pressure of MMT on bilateral LE. Unable to flex L hip against gravity in sitting however when returning to bed she lifts her leg to return to bed. Able to perform SLR against gravity inconsistently. States she cannot lift legs to perform heel to shin testing. Reports intact sensation to light touch. Pt reports increase in L ankle/heel pain with light touch by RN while donning socks       Communication Communication Communication: No difficulties   Cognition Arousal/Alertness: Awake/alert Behavior During Therapy: Flat affect Overall Cognitive Status: Difficult to assess (decreased processing of info at times. )        Memory: Decreased short-term memory             General Comments       Exercises       Shoulder Instructions      Home Living Family/patient expects to be discharged to:: Private residence Living Arrangements: Alone Available Help at Discharge: Home health;Personal care attendant Type of Home: Apartment Home Access: Elevator     Home Layout: One level     Bathroom Shower/Tub: Tub/shower unit Shower/tub characteristics: Architectural technologist: Standard Bathroom Accessibility: Yes How Accessible: Accessible via walker Home Equipment: [place] - single point;Walker - 4 wheels;Walker - 2 wheels;Walker - standard          Prior Functioning/Environment Level of Independence: Needs assistance  Gait / Transfers Assistance Needed: Pt reports she use single point cane for ambulation ADL's / Homemaking Assistance Needed: Pt states she is independent with ADLs but has assist with IADLs. Does not drive.  She did report occasional assistance with her bathing and dressing from home health. Communication / Swallowing Assistance Needed: No reported baselines slurring of speech      OT Diagnosis: Generalized weakness;Altered mental status;Cognitive  deficits   OT Problem List: Decreased strength;Impaired balance (sitting and/or standing);Decreased cognition;Pain;Decreased range of motion;Decreased safety awareness;Decreased coordination;Impaired UE functional use   OT Treatment/Interventions: Self-care/ADL training;Therapeutic exercise;Patient/family education;Neuromuscular education;DME and/or AE instruction;Cognitive remediation/compensation;Therapeutic activities;Energy conservation    OT Goals(Current goals can be found in the care plan section) Acute Rehab OT Goals Patient Stated Goal: Be able to take care of myself OT Goal Formulation: With patient Time For Goal Achievement: 04/09/15 Potential to Achieve Goals: Good  OT Frequency: Min 1X/week   Barriers to D/C:             Co-evaluation              End of Session    Activity Tolerance:   Patient left: in bed;Other (comment) (tech in to transport to MRI)   Time: 1220-1259 OT Time Calculation (min): 39 min Charges:  OT General Charges $OT Visit: 1 Procedure OT Evaluation $OT Eval Moderate Complexity: 1 Procedure OT Treatments $Self Care/Home Management : 8-22 mins G-Codes: OT G-codes **NOT FOR INPATIENT CLASS** Functional Assessment Tool Used: clinical judgment, self care assessment, strength testing Functional Limitation: Self care Self Care Current Status [id number]): At least 60 percent but less than 80 percent impaired, limited or restricted Self Care Goal Status [id number]): At least 20 percent but less than 40 percent impaired, limited or restricted  [employer], OTR/L, CLT 03/26/2015, 1:27 PM

## 2015-03-26 NOTE — Plan of Care (Signed)
Called Dr. Margaretmary Eddy b/c of pt's somnolence - putting a hold on all sedative meds - trazodone, seroquel and lyrica.  Stated it sometimes takes a while to work out of system - as long as pt is stable.

## 2015-03-26 NOTE — Progress Notes (Signed)
Initial Nutrition Assessment     INTERVENTION:   Meals and Snacks: Cater to patient preferences; if po itnake adequate, recommend addition of Heart Healthy to current diet order  NUTRITION DIAGNOSIS:     Reassess on follow-up  GOAL:   Patient will meet greater than or equal to 90% of their needs  MONITOR:    (Energy Intake, Anthropometrics, Digestive system, Electrolyte/Renal Profile, Glucose Profile, Lipid Profile)  REASON FOR ASSESSMENT:   Consult COPD Protocol  ASSESSMENT:    Pt admitted with 1 day duration of altered speech, bilateral LE weakness x 1 week with acute stoke, neurology following; pt down for MRI on visit today. Pt seen by SLP/OT/PT  Past Medical History  Diagnosis Date  . COPD (chronic obstructive pulmonary disease) ([place])   . PTSD (post-traumatic stress disorder)   . Rhabdomyolysis   . Bipolar 1 disorder ([place])   . Arthritis   . Stroke ([place])   . MI (myocardial infarction) ([place])   . Depression   . ADHD (attention deficit hyperactivity disorder)   . Asthma      Diet Order:  DIET DYS 3 Room service appropriate?: Yes; Fluid consistency:: Thin   Energy Intake: no po intake recorded  Food and Nutrition related History: unable to assess  Skin:  Reviewed, no issues  Last BM:  1/28   Electrolyte and Renal Profile:  Recent Labs Lab 03/25/15 1837  BUN 15  CREATININE 1.44*  NA 141  K 4.6   Protein Profile:  Recent Labs Lab 03/25/15 1837  ALBUMIN 3.5   Glucose Profile:  Recent Labs  03/25/15 1840  GLUCAP 83   Lipid Profile:     Component Value Date/Time   CHOL 160 03/26/2015 0532   CHOL 169 10/21/2013 0406   TRIG 152* 03/26/2015 0532   TRIG 182 10/21/2013 0406   HDL 28* 03/26/2015 0532   HDL 35* 10/21/2013 0406   CHOLHDL 5.7 03/26/2015 0532   VLDL 30 03/26/2015 0532   VLDL 36 10/21/2013 0406   LDLCALC 102* 03/26/2015 0532   LDLCALC 98 10/21/2013 0406     Lab Results  Component Value Date   HGBA1C 5.6 03/26/2015    Meds: lipitor, MVI  Height:   Ht Readings from Last 1 Encounters:  03/25/15 5\' 1"  (1.549 m)    Weight: weight relatively stable since November based on weight encounters (some weight gain followed by wt loss)  Wt Readings from Last 1 Encounters:  03/25/15 252 lb 12.8 oz (114.669 kg)    Wt Readings from Last 10 Encounters:  03/25/15 252 lb 12.8 oz (114.669 kg)  03/01/15 261 lb (118.389 kg)  02/15/15 261 lb 8 oz (118.616 kg)  02/13/15 260 lb (117.935 kg)  01/31/15 263 lb (119.296 kg)  01/27/15 255 lb (115.667 kg)  01/26/15 250 lb (113.399 kg)  01/15/15 245 lb (111.131 kg)  01/13/15 246 lb 9.6 oz (111.857 kg)  01/12/15 240 lb (108.863 kg)    BMI:  Body mass index is 47.79 kg/(m^2).  LOW Care Level  Kerman Passey MS, [place], LDN [phone] Pager  [phone] Weekend/On-Call Pager

## 2015-03-26 NOTE — Consult Note (Addendum)
Referring Physician: Gouru    Chief Complaint: Slurred speech, right sided weakness  HPI: Elexa Latzke is an 55 y.o. female who is extremely lethargic this morning.  No family present to provide history therefore history obtained from the chart.  It appears that she presented with a one day history of slurred speech and difficulty getting her words out.  At that time she had no complaints of focal weakness but did reports that her lower extremities had been weak for about a week.  Patient now also reports right sided weakness as well.    Date last known well: Unable to determine Time last known well: Unable to determine tPA Given: No: Unable to determine LKW  Past Medical History  Diagnosis Date  . COPD (chronic obstructive pulmonary disease) ([place])   . PTSD (post-traumatic stress disorder)   . Rhabdomyolysis   . Bipolar 1 disorder ([place])   . Arthritis   . Stroke ([place])   . MI (myocardial infarction) ([place])   . Depression   . ADHD (attention deficit hyperactivity disorder)   . Asthma     Past Surgical History  Procedure Laterality Date  . Replacement total knee Left     Family History  Problem Relation Age of Onset  . Hernia Mother   . Heart disease Mother   . OCD Mother   . Diabetes Mother   . Parkinson's disease Father   . Bipolar disorder Sister   . Schizophrenia Sister   . ADD / ADHD Sister   . Alcohol abuse Brother   . Bipolar disorder Sister   . Paranoid behavior Sister   . ADD / ADHD Sister    Social History:  reports that she has been smoking Cigarettes.  She started smoking about 30 years ago. She has been smoking about 0.50 packs per day. She has never used smokeless tobacco. She reports that she does not drink alcohol or use illicit drugs.  Allergies:  Allergies  Allergen Reactions  . Codeine Itching    Medications:  I have reviewed the patient's current medications. Prior to Admission:  Prescriptions prior to admission  Medication Sig Dispense  Refill Last Dose  . albuterol (PROVENTIL HFA;VENTOLIN HFA) 108 (90 Base) MCG/ACT inhaler Inhale 1-2 puffs into the lungs every 6 (six) hours as needed for wheezing or shortness of breath. 6.7 g 1 Past Week at Unknown time  . ALPRAZolam (XANAX) 1 MG tablet Take 1 tablet (1 mg total) by mouth 4 (four) times daily as needed for anxiety. 120 tablet 2 03/24/2015 at Unknown time  . cyclobenzaprine (FLEXERIL) 5 MG tablet Take 2 tablets (10 mg total) by mouth 3 (three) times daily as needed for muscle spasms. 90 tablet 5 Past Week at Unknown time  . divalproex (DEPAKOTE) 250 MG DR tablet Take 1 tablet (250 mg total) by mouth 2 (two) times daily. 60 tablet 1 Past Week at Unknown time  . escitalopram (LEXAPRO) 20 MG tablet Take 20 mg by mouth every other day.   03/23/2015 at unknown   . glucosamine-chondroitin 500-400 MG tablet Take 1 tablet by mouth 2 (two) times daily.   03/24/2015 at Unknown time  . ipratropium-albuterol (DUONEB) 0.5-2.5 (3) MG/3ML SOLN Take 3 mLs by nebulization every 4 (four) hours as needed. (Patient taking differently: Take 3 mLs by nebulization every 4 (four) hours as needed (for wheezing). ) 360 mL 6 Past Week at Unknown time  . lidocaine (XYLOCAINE) 5 % ointment Apply 1 application topically as needed for mild pain.  03/24/2015 at Unknown time  . LYRICA 200 MG capsule Take 1 capsule (200 mg total) by mouth 2 (two) times daily. 60 capsule 5 03/25/2015 at Unknown time  . meloxicam (MOBIC) 15 MG tablet Take 1 tablet (15 mg total) by mouth daily. 30 tablet 0 Past Week at Unknown time  . Multiple Vitamin (MULTIVITAMIN WITH MINERALS) TABS tablet Take 1 tablet by mouth daily.   03/24/2015 at Unknown time  . QUEtiapine (SEROQUEL XR) 300 MG 24 hr tablet Take 300 mg by mouth at bedtime.   03/24/2015 at Unknown time  . traMADol (ULTRAM) 50 MG tablet Take 1 tablet (50 mg total) by mouth 2 (two) times daily. (Patient taking differently: Take 50 mg by mouth 2 (two) times daily as needed for moderate pain.  ) 60 tablet 0 Past Week at Unknown time  . traZODone (DESYREL) 100 MG tablet Take 1 tablet (100 mg total) by mouth 2 (two) times daily. 60 tablet 1 03/24/2015 at Unknown time  . naproxen (NAPROSYN) 500 MG tablet Take 1 tablet (500 mg total) by mouth 2 (two) times daily with a meal. (Patient not taking: Reported on 03/25/2015) 30 tablet 0 unknown at unknown  . predniSONE (STERAPRED UNI-PAK 21 TAB) 10 MG (21) TBPK tablet Take 1 tablet (10 mg total) by mouth daily. Please take 6 pills on the day 1 and 2, then 5 pills on day 3 and 4, then taper by 10 mg every 2 days until finished, thank you (Patient not taking: Reported on 03/25/2015) 42 tablet 0    Scheduled: . aspirin  300 mg Rectal Daily   Or  . aspirin  325 mg Oral Daily  . atorvastatin  40 mg Oral q1800  . divalproex  250 mg Oral BID  . escitalopram  20 mg Oral QODAY  . meloxicam  15 mg Oral Daily  . multivitamin with minerals  1 tablet Oral Daily  . pregabalin  200 mg Oral BID  . QUEtiapine  300 mg Oral QHS  . sodium chloride flush  3 mL Intravenous Q12H    ROS: History obtained from the patient  General ROS: negative for - chills, fatigue, fever, night sweats, weight gain or weight loss Psychological ROS: negative for - behavioral disorder, hallucinations, memory difficulties, mood swings or suicidal ideation Ophthalmic ROS: negative for - blurry vision, double vision, eye pain or loss of vision ENT ROS: negative for - epistaxis, nasal discharge, oral lesions, sore throat, tinnitus or vertigo Allergy and Immunology ROS: negative for - hives or itchy/watery eyes Hematological and Lymphatic ROS: negative for - bleeding problems, bruising or swollen lymph nodes Endocrine ROS: negative for - galactorrhea, hair pattern changes, polydipsia/polyuria or temperature intolerance Respiratory ROS: negative for - cough, hemoptysis, shortness of breath or wheezing Cardiovascular ROS: lower extremity swelling Gastrointestinal ROS: negative for -  abdominal pain, diarrhea, hematemesis, nausea/vomiting or stool incontinence Genito-Urinary ROS: negative for - dysuria, hematuria, incontinence or urinary frequency/urgency Musculoskeletal ROS: left foot pain Neurological ROS: as noted in HPI Dermatological ROS: negative for rash and skin lesion changes  Physical Examination: Blood pressure 107/54, pulse 106, temperature 99 F (37.2 C), temperature source Oral, resp. rate 22, height 5\' 1"  (1.549 m), weight 114.669 kg (252 lb 12.8 oz), SpO2 96 %.  HEENT-  Normocephalic, no lesions, without obvious abnormality.  Normal external eye and conjunctiva.  Normal TM's bilaterally.  Normal auditory canals and external ears. Normal external nose, mucus membranes and septum.  Normal pharynx. Cardiovascular- S1, S2 normal, pulses palpable throughout  Lungs- chest clear, no wheezing, rales, normal symmetric air entry Abdomen- soft, non-tender; bowel sounds normal; no masses,  no organomegaly Extremities- BLE edema and erythema Lymph-no adenopathy palpable Musculoskeletal-no joint tenderness, deformity or swelling Skin-erythema noted in lower extremities  Neurological Examination Mental Status: Lethargic but can be awakened, oriented, thought content appropriate.  Speech fluent without evidence of aphasia.  Does have some slurring of speech.  Able to follow commands but needs reinforcement and constant alerting for 3-step commands. Cranial Nerves: II: Discs flat bilaterally; Visual fields grossly normal, pupils equal, round, reactive to light and accommodation III,IV, VI: ptosis not present, extra-ocular motions intact bilaterally V,VII: left facial, facial light touch sensation normal bilaterally VIII: hearing normal bilaterally IX,X: gag reflex present XI: bilateral shoulder shrug XII: midline tongue extension Motor: Right : Upper extremity   5/5    Left:     Upper extremity   5/5  Lower extremity   5/5     Lower extremity   5/5 Tone and  bulk:normal tone throughout; no atrophy noted Sensory: Pinprick and light touch intact throughout, bilaterally Deep Tendon Reflexes: 2+ in the upper extremities and absent in the lower extremities Plantars: Right: upgoing   Left: downgoing Cerebellar: normal finger-to-nose testing.  Unable to perform heel-to-shin testing with either lower extremity Gait: not tested due to safety concerns   Laboratory Studies:  Basic Metabolic Panel:  Recent Labs Lab 03/25/15 1837  NA 141  K 4.6  CL 102  CO2 30  GLUCOSE 100*  BUN 15  CREATININE 1.44*  CALCIUM 9.1    Liver Function Tests:  Recent Labs Lab 03/25/15 1837  AST 161*  ALT 230*  ALKPHOS 72  BILITOT 0.9  PROT 7.8  ALBUMIN 3.5   No results for input(s): LIPASE, AMYLASE in the last 168 hours. No results for input(s): AMMONIA in the last 168 hours.  CBC:  Recent Labs Lab 03/25/15 1837  WBC 9.2  NEUTROABS 6.3  HGB 13.7  HCT 42.3  MCV 92.8  PLT 153    Cardiac Enzymes:  Recent Labs Lab 03/25/15 1837  TROPONINI 0.08*    BNP: Invalid input(s): POCBNP  CBG:  Recent Labs Lab 03/25/15 1840  GLUCAP 83    Microbiology: Results for orders placed or performed during the hospital encounter of 06/14/14  Urine culture     Status: None   Collection Time: 06/14/14  4:50 PM  Result Value Ref Range Status   Micro Text Report   Final       SOURCE: CLEAN CATCH    ORGANISM 1                >100,000 CFU/ML Candida glabrata   COMMENT                   -   ANTIBIOTIC                    ORG#1                                                 Coagulation Studies:  Recent Labs  03/25/15 1837  LABPROT 13.4  INR 1.00    Urinalysis: No results for input(s): COLORURINE, LABSPEC, PHURINE, GLUCOSEU, HGBUR, BILIRUBINUR, KETONESUR, PROTEINUR, UROBILINOGEN, NITRITE, LEUKOCYTESUR in the last 168 hours.  Invalid input(s): APPERANCEUR  Lipid Panel:    Component  Value Date/Time   CHOL 160 03/26/2015 0532   CHOL 169  10/21/2013 0406   TRIG 152* 03/26/2015 0532   TRIG 182 10/21/2013 0406   HDL 28* 03/26/2015 0532   HDL 35* 10/21/2013 0406   CHOLHDL 5.7 03/26/2015 0532   VLDL 30 03/26/2015 0532   VLDL 36 10/21/2013 0406   LDLCALC 102* 03/26/2015 0532   LDLCALC 98 10/21/2013 0406    HgbA1C:  Lab Results  Component Value Date   HGBA1C 5.7 03/06/2015    Urine Drug Screen:     Component Value Date/Time   LABOPIA NONE DETECTED 11/05/2014 1620   LABBENZ POSITIVE* 11/05/2014 1620   AMPHETMU NONE DETECTED 11/05/2014 1620   THCU NONE DETECTED 11/05/2014 1620   LABBARB NONE DETECTED 11/05/2014 1620    Alcohol Level: No results for input(s): ETH in the last 168 hours.  Other results: EKG: sinus tachycardia at 104 bpm.  Imaging: Ct Head Wo Contrast  03/25/2015  CLINICAL DATA:  Left-sided weakness EXAM: CT HEAD WITHOUT CONTRAST TECHNIQUE: Contiguous axial images were obtained from the base of the skull through the vertex without intravenous contrast. COMPARISON:  07/02/2014 FINDINGS: No acute intracranial abnormality. Specifically, no hemorrhage, hydrocephalus, mass lesion, acute infarction, or significant intracranial injury. No acute calvarial abnormality. Visualized paranasal sinuses and mastoids clear. Orbital soft tissues unremarkable. IMPRESSION: Negative noncontrast head CT. Electronically Signed   By: Rolm Baptise M.D.   On: 03/25/2015 17:59   US Carotid Bilateral  03/26/2015  CLINICAL DATA:  Stroke and visual disturbance.  History of smoking. EXAM: BILATERAL CAROTID DUPLEX ULTRASOUND TECHNIQUE: Pearline Cables scale imaging, color Doppler and duplex ultrasound were performed of bilateral carotid and vertebral arteries in the neck. COMPARISON:  Carotid Doppler ultrasound - 07/02/2014 FINDINGS: Criteria: Quantification of carotid stenosis is based on velocity parameters that correlate the residual internal carotid diameter with NASCET-based stenosis levels, using the diameter of the distal internal carotid lumen  as the denominator for stenosis measurement. The following velocity measurements were obtained: RIGHT ICA:  107/34 cm/sec CCA:  [id number] cm/sec SYSTOLIC ICA/CCA RATIO:  0.6 DIASTOLIC ICA/CCA RATIO:  1.0 ECA:  159 cm/sec LEFT ICA:  160/40 cm/sec CCA:  [id number] cm/sec SYSTOLIC ICA/CCA RATIO:  1.0 DIASTOLIC ICA/CCA RATIO:  1.0 ECA:  117 cm/sec RIGHT CAROTID ARTERY: There is a very minimal amount of intimal thickening involving the right carotid bulb (Image 16), extending to involve the origin and proximal aspect the right internal carotid artery (image 23), morphologically similar to the 06/2014 examination and not definitely resulting in a hemodynamically significant stenosis. Borderline elevated peak systolic velocities within the mid and distal aspects of the right internal carotid artery felt to be factitious Lee elevated due to sampling at a location of tortuous flow. RIGHT VERTEBRAL ARTERY:  Antegrade flow LEFT CAROTID ARTERY: There is a minimal amount of intimal thickening and eccentric hypoechoic plaque involving the origin and proximal aspects of the left internal carotid artery (image 56), morphologically similar to the 06/2014 examination and not definitely resulting in elevated peak systolic velocities within the left internal carotid artery. Borderline elevated peak systolic velocities throughout the left internal carotid artery are transmitted from the common carotid artery with preservation of the systolic and diastolic ICA/CCA ratios. LEFT VERTEBRAL ARTERY:  Antegrade flow IMPRESSION: Minimal amount of bilateral atherosclerotic plaque and intimal wall thickening, left greater than right, not resulting in a hemodynamically significant stenosis within either internal carotid artery. Electronically Signed   By: Sandi Mariscal M.D.   On: 03/26/2015 09:22  US Venous Img Lower Unilateral Left  03/25/2015  CLINICAL DATA:  Acute onset of left leg swelling. Initial encounter. EXAM: LEFT LOWER EXTREMITY VENOUS  DOPPLER ULTRASOUND TECHNIQUE: Gray-scale sonography with graded compression, as well as color Doppler and duplex ultrasound were performed to evaluate the lower extremity deep venous systems from the level of the common femoral vein and including the common femoral, femoral, profunda femoral, popliteal and calf veins including the posterior tibial, peroneal and gastrocnemius veins when visible. The superficial great saphenous vein was also interrogated. Spectral Doppler was utilized to evaluate flow at rest and with distal augmentation maneuvers in the common femoral, femoral and popliteal veins. COMPARISON:  None. FINDINGS: Contralateral Common Femoral Vein: Respiratory phasicity is normal and symmetric with the symptomatic side. No evidence of thrombus. Normal compressibility. Common Femoral Vein: No evidence of thrombus. Normal compressibility, respiratory phasicity and response to augmentation. Saphenofemoral Junction: No evidence of thrombus. Normal compressibility and flow on color Doppler imaging. Profunda Femoral Vein: No evidence of thrombus. Normal compressibility and flow on color Doppler imaging. Femoral Vein: No evidence of thrombus. Normal compressibility, respiratory phasicity and response to augmentation. Popliteal Vein: No evidence of thrombus. Normal compressibility, respiratory phasicity and response to augmentation. Calf Veins: Not well seen on this study. Superficial Great Saphenous Vein: No evidence of thrombus. Normal compressibility and flow on color Doppler imaging. Venous Reflux:  None. Other Findings:  None. IMPRESSION: No evidence of deep venous thrombosis. Electronically Signed   By: Garald Balding M.D.   On: 03/25/2015 23:12    Assessment: 55 y.o. female presenting with difficulty with speech.  Now with complaints of right sided weakness as well.  Patient very lethargic this morning and therefore full cooperation is difficult to obtain but no focal weakness appreciated.  Speech is  slurred and facial droop noted.  Head CT personally reviewed and shows no acute changes.  Patient with stroke risk factor of smoking.  On no antiplatelet therapy at home.  Further work up recommended.   Carotid US shows no hemodynamically significant stenosis.    Stroke Risk Factors - smoking  Plan: 1. HgbA1c, fasting lipid panel 2. MRI, MRA  of the brain without contrast 3. PT consult, OT consult, Speech consult 4. Echocardiogram 5. Prophylactic therapy-Antiplatelet med: Aspirin - dose 325mg  daily 6. NPO until RN stroke swallow screen 7. Telemetry monitoring 8. Frequent neuro checks 9. Decrease sedating medications  Alexis Goodell, MD Neurology [phone] 03/26/2015, 11:46 AM

## 2015-03-26 NOTE — Evaluation (Signed)
Speech Language Pathology Evaluation Patient Details Name: Nicole Austin MRN: [id number] DOB: 27-May-1960 Today's Date: 03/26/2015 Time: [id number] SLP Time Calculation (min) (ACUTE ONLY): 50 min  Problem List:  Patient Active Problem List   Diagnosis Date Noted  . Stroke ([place]) 03/25/2015  . Bipolar I disorder ([place]) 03/03/2015  . Bipolar I disorder, most recent episode depressed ([place]) 12/22/2014  . Morbid obesity ([place]) 12/22/2014  . Anxiety 08/16/2014  . Affective bipolar disorder ([place]) 08/16/2014  . Gout 08/16/2014  . Extreme obesity ([place]) 08/16/2014  . Muscle spasms of both lower extremities 08/16/2014  . Back pain, chronic 08/13/2014  . Low back pain with sciatica 08/04/2014  . Compulsive tobacco user syndrome 08/04/2014  . Current tobacco use 08/04/2014  . Polysubstance abuse 07/04/2014  . Anxiety, generalized 11/24/2013  . Imbalance 11/24/2013  . Blurred vision 11/24/2013  . Cephalalgia 11/24/2013  . COPD, moderate ([place]) 11/18/2013  . Moderate COPD (chronic obstructive pulmonary disease) ([place]) 11/18/2013  . HPV (human papilloma virus) infection 07/17/2013  . Arthritis of knee, degenerative 07/15/2013  . H/O neoplasm 07/31/2011   Past Medical History:  Past Medical History  Diagnosis Date  . COPD (chronic obstructive pulmonary disease) ([place])   . PTSD (post-traumatic stress disorder)   . Rhabdomyolysis   . Bipolar 1 disorder ([place])   . Arthritis   . Stroke ([place])   . MI (myocardial infarction) ([place])   . Depression   . ADHD (attention deficit hyperactivity disorder)   . Asthma    Past Surgical History:  Past Surgical History  Procedure Laterality Date  . Replacement total knee Left    HPI:      Assessment / Plan / Recommendation Clinical Impression  pt presents with a mild aphasia possible  due to medications. pt unable to state the days of the week, months of the year, and would often lose train of thought in sentence. On one conversational task she kept  repeating, her birthday is, and the date would change. SLP was present for intake of morning meal as pt was in test prior to this visit. pt could not cut meal independently,. st recommended downgrade for motor skills ability, however no overt ssx aspiraiton noted. pt recommended to have slp follow up in 1-3 days to ensure speech deficits resolve.     SLP Assessment  Patient needs continued Speech Lanaguage Pathology Services    Follow Up Recommendations    Cont with current poc   Frequency and Duration min 3x week  1 week      SLP Evaluation Prior Functioning  Cognitive/Linguistic Baseline: Within functional limits Type of Home: Apartment Available Help at Discharge: Home health;Personal care attendant   Cognition  Overall Cognitive Status: Within Functional Limits for tasks assessed Arousal/Alertness: Suspect due to medications Orientation Level: Oriented to person;Oriented to situation;Disoriented to place;Disoriented to time Memory: Appears intact Awareness: Appears intact Problem Solving: Appears intact Safety/Judgment: Appears intact Comments: pt had difficutly answering basic questions about days of the week and months of the year. She was able to give me a hx of recent events but difficult time processing. this could be due to medications as multiple medications were given the night before.     Comprehension  Auditory Comprehension Overall Auditory Comprehension: Appears within functional limits for tasks assessed Yes/No Questions: Within Functional Limits Commands: Within Functional Limits Conversation: Simple Interfering Components: Attention;Processing speed EffectiveTechniques: Extra processing time;Repetition;Slowed speech Visual Recognition/Discrimination Discrimination: Within Function Limits    Expression Expression Primary Mode of Expression: Verbal Verbal  Expression Overall Verbal Expression: Impaired Initiation: No impairment Automatic Speech:  Counting;Social Response;Name Level of Generative/Spontaneous Verbalization: Conversation Repetition: No impairment Naming: No impairment Pragmatics: No impairment Interfering Components: Attention Effective Techniques: Open ended questions Non-Verbal Means of Communication: Not applicable Other Verbal Expression Comments: pt unable to complete days of the week, moths of the year, she continued to try to count. suspected due to medications.  Written Expression Dominant Hand: Right   Oral / Motor  Oral Motor/Sensory Function Overall Oral Motor/Sensory Function: Within functional limits Motor Speech Overall Motor Speech: Appears within functional limits for tasks assessed Respiration: Within functional limits Phonation: Normal Resonance: Within functional limits Articulation: Within functional limitis Intelligibility: Intelligible Motor Planning: Witnin functional limits   GO                    Nicole Austin 03/26/2015, 12:05 PM

## 2015-03-26 NOTE — Care Management Obs Status (Signed)
[facility] NOTIFICATION   Patient Details  Name: Nicole Austin MRN: [id number] Date of Birth: March 09, 1960   Medicare Observation Status Notification Given:  Yes (patient lethargic/copy left in room/no family in room)    Ival Bible, RN 03/26/2015, 6:41 PM

## 2015-03-27 DIAGNOSIS — G5621 Lesion of ulnar nerve, right upper limb: Secondary | ICD-10-CM | POA: Diagnosis not present

## 2015-03-27 DIAGNOSIS — I639 Cerebral infarction, unspecified: Secondary | ICD-10-CM | POA: Diagnosis not present

## 2015-03-27 DIAGNOSIS — M6289 Other specified disorders of muscle: Secondary | ICD-10-CM

## 2015-03-27 DIAGNOSIS — R7989 Other specified abnormal findings of blood chemistry: Secondary | ICD-10-CM | POA: Diagnosis not present

## 2015-03-27 DIAGNOSIS — N179 Acute kidney failure, unspecified: Secondary | ICD-10-CM | POA: Diagnosis not present

## 2015-03-27 DIAGNOSIS — R5383 Other fatigue: Secondary | ICD-10-CM | POA: Diagnosis not present

## 2015-03-27 DIAGNOSIS — R600 Localized edema: Secondary | ICD-10-CM | POA: Diagnosis not present

## 2015-03-27 MED ORDER — SODIUM CHLORIDE 0.9 % IV SOLN
INTRAVENOUS | Status: AC
  Administered 2015-03-27: 18:00:00 via INTRAVENOUS

## 2015-03-27 NOTE — Clinical Social Work Note (Signed)
CSW consulted for SNF. Pt assessed, full assessment to follow. Pt would like to return home with home health PT. CSW will continue to follow.   Darden Dates, MSW, LCSW Clinical Social Worker [phone]

## 2015-03-27 NOTE — Progress Notes (Signed)
Physical Therapy Treatment Patient Details Name: Nicole Austin MRN: [id number] DOB: [birth year] Today's Date: 03/27/2015    History of Present Illness Patient is a 55 yo old female with a history of COPD, denies use of oxygen at home, bipolar type 1, anxiety.  She reports living at home in an apartment alone, however, nursing reports patient has been staying at her sisters house more recently.  Patient states her sister felt she had a blank stare, had difficulty speaking and called EMS.  She was admitted to [facility] to rule out CVA.  She reports she had been independent prior to admission but also had home health services almost daily to assist with housekeeping and some self care tasks.  Patient appears to be a poor historian.     PT Comments    Pt does not show unilateral weakness today and though she is generally weak and c/o vague pain she is able to participate fairly well with PT.  She shows good effort with exercises, and ambulation, but does need regular redirecting to stay on task.  Pt with no LOBs during ambulation with FWW, but does not show great confidence or activity tolerance. Depending on pt's continued improvement and her amount of assist at home she could be appropriate for HHPT on d/c.  Follow Up Recommendations  SNF (shows functional improvement, may be able to go home w/ HHPT)     Equipment Recommendations       Recommendations for Other Services       Precautions / Restrictions Precautions Precautions: Fall Restrictions Weight Bearing Restrictions: No    Mobility  Bed Mobility Overal bed mobility: Modified Independent Bed Mobility: Supine to Sit;Sit to Supine     Supine to sit: Supervision Sit to supine: Supervision   General bed mobility comments: Pt able to get sitting EOB w/o issue, and returns to supine after ambulation w/o assist  Transfers Overall transfer level: Modified independent Equipment used: Rolling walker (2 wheeled) Transfers: Sit  to/from Stand Sit to Stand: Min guard         General transfer comment: Pt is able to get to standing w/o direct assist though she does need cuing and encourgament.  Ambulation/Gait Ambulation/Gait assistance: Min guard Ambulation Distance (Feet): 35 Feet Assistive device: Rolling walker (2 wheeled)       General Gait Details: Pt with slow but safe ambulation and though she has some fatigue with the effort she does not appear to have any limp, unilateral weakness, or other signficant safety concerns though she was impulsive and needed cuing to remain focused on task at hand.    Stairs            Wheelchair Mobility    Modified Rankin (Stroke Patients Only)       Balance                                    Cognition Arousal/Alertness: Awake/alert Behavior During Therapy: Impulsive Overall Cognitive Status: Difficult to assess                      Exercises General Exercises - Lower Extremity Ankle Circles/Pumps: AROM;10 reps Heel Slides: Strengthening;10 reps Hip ABduction/ADduction: Strengthening;10 reps Straight Leg Raises: Strengthening;10 reps    General Comments        Pertinent Vitals/Pain Pain Score:  (pt reports only minimal chronic pain)    Home Living  Prior Function            PT Goals (current goals can now be found in the care plan section) Progress towards PT goals: Progressing toward goals    Frequency  Min 2X/week    PT Plan Frequency needs to be updated    Co-evaluation             End of Session Equipment Utilized During Treatment: Gait belt;Oxygen (2 liters, O2 drops from 98% to 92% with activity) Activity Tolerance: Patient limited by fatigue Patient left: with bed alarm set;with call bell/phone within reach     Time: 1359-1424 PT Time Calculation (min) (ACUTE ONLY): 25 min  Charges:  $Gait Training: 8-22 mins $Therapeutic Exercise: 8-22 mins                     G Codes:     Nicole Austin, PT, DPT [phone]  Nicole Austin 03/27/2015, 4:16 PM

## 2015-03-27 NOTE — Progress Notes (Signed)
[facility] [place] NAME: Nicole Austin    MR#:  [id number]  DATE OF BIRTH:  1960-05-29  SUBJECTIVE:  CHIEF COMPLAINT:  Patient is more awahe and alert today, reporting rt 4 and 5  Finger numbness . Lt sided weakness improved , denies any dysphagia or dysarthria  REVIEW OF SYSTEMS:  CONSTITUTIONAL: Denies fevers, chills, fatigue, weakness.  EYES: Denies blurred vision, double vision, or eye pain.  EARS, NOSE, THROAT: Denies tinnitus, ear pain, hearing loss.  RESPIRATORY: denies cough, shortness of breath, wheezing  CARDIOVASCULAR: Denies chest pain, palpitations, edema.  GASTROINTESTINAL: Denies nausea, vomiting, diarrhea, abdominal pain.  GENITOURINARY: Denies dysuria, hematuria.  ENDOCRINE: Denies nocturia or thyroid problems. HEMATOLOGIC AND LYMPHATIC: Denies easy bruising or bleeding.  SKIN: Denies rash or lesions.  MUSCULOSKELETAL: Denies pain in neck, back, shoulder, knees, hips, or further arthritic symptoms.  NEUROLOGIC:  Rt hand numbness PSYCHIATRIC: Positive anxiety denies depressive symptoms.  DRUG ALLERGIES:   Allergies  Allergen Reactions  . Codeine Itching    VITALS:  Blood pressure 114/51, pulse 99, temperature 97.7 F (36.5 C), temperature source Oral, resp. rate 20, height 5\' 1"  (1.549 m), weight 114.669 kg (252 lb 12.8 oz), SpO2 97 %.  PHYSICAL EXAMINATION:  GENERAL:  55 y.o.-year-old patient lying in the bed with no acute distress.  EYES: Pupils equal, round, reactive to light and accommodation. No scleral icterus.  HEENT: Head atraumatic, normocephalic. Oropharynx and nasopharynx clear.  NECK:  Supple, no jugular venous distention. No thyroid enlargement, no tenderness.  LUNGS: Normal breath sounds bilaterally, no wheezing, rales,rhonchi or crepitation. No use of accessory muscles of respiration.  CARDIOVASCULAR: S1, S2 normal. No murmurs, rubs, or gallops.  ABDOMEN: Soft, nontender,  nondistended. Bowel sounds present. No organomegaly or mass.  EXTREMITIES: No pedal edema, cyanosis, or clubbing.  NEUROLOGIC: Motor 5/5 in all extremities . Rt 4th and 5 th finger numbness.   Sensation intact. Gait not checked.  PSYCHIATRIC: The patient is lethargic but arousable and answers questions  SKIN: No obvious rash, lesion, or ulcer.    LABORATORY PANEL:   CBC  Recent Labs Lab 03/25/15 1837  WBC 9.2  HGB 13.7  HCT 42.3  PLT 153   ------------------------------------------------------------------------------------------------------------------  Chemistries   Recent Labs Lab 03/25/15 1837  NA 141  K 4.6  CL 102  CO2 30  GLUCOSE 100*  BUN 15  CREATININE 1.44*  CALCIUM 9.1  AST 161*  ALT 230*  ALKPHOS 72  BILITOT 0.9   ------------------------------------------------------------------------------------------------------------------  Cardiac Enzymes  Recent Labs Lab 03/26/15 2213  TROPONINI 0.05*   ------------------------------------------------------------------------------------------------------------------  RADIOLOGY:  Ct Head Wo Contrast  03/25/2015  CLINICAL DATA:  Left-sided weakness EXAM: CT HEAD WITHOUT CONTRAST TECHNIQUE: Contiguous axial images were obtained from the base of the skull through the vertex without intravenous contrast. COMPARISON:  07/02/2014 FINDINGS: No acute intracranial abnormality. Specifically, no hemorrhage, hydrocephalus, mass lesion, acute infarction, or significant intracranial injury. No acute calvarial abnormality. Visualized paranasal sinuses and mastoids clear. Orbital soft tissues unremarkable. IMPRESSION: Negative noncontrast head CT. Electronically Signed   By: Rolm Baptise M.D.   On: 03/25/2015 17:59   Mr Brain Wo Contrast  03/26/2015  CLINICAL DATA:  One day duration of speech disturbance. Difficulty with word finding. EXAM: MRI HEAD WITHOUT CONTRAST TECHNIQUE: Multiplanar, multiecho pulse sequences of the  brain and surrounding structures were obtained without intravenous contrast. COMPARISON:  Head CT 03/25/2015.  MRI 07/02/2014. FINDINGS: Diffusion imaging does not show any acute or  subacute infarction. The brainstem and cerebellum are normal. The cerebral hemispheres show mild atrophy but no evidence of focal small or large vessel infarction. No mass lesion, hemorrhage, hydrocephalus or extra-axial collection. No pituitary mass. No inflammatory sinus disease. No skull or skullbase lesion. Major vessels at the base of the brain show flow. IMPRESSION: No acute or reversible finding. Mild brain volume loss. Otherwise normal. Electronically Signed   By: Nelson Chimes M.D.   On: 03/26/2015 13:56   US Carotid Bilateral  03/26/2015  CLINICAL DATA:  Stroke and visual disturbance.  History of smoking. EXAM: BILATERAL CAROTID DUPLEX ULTRASOUND TECHNIQUE: Pearline Cables scale imaging, color Doppler and duplex ultrasound were performed of bilateral carotid and vertebral arteries in the neck. COMPARISON:  Carotid Doppler ultrasound - 07/02/2014 FINDINGS: Criteria: Quantification of carotid stenosis is based on velocity parameters that correlate the residual internal carotid diameter with NASCET-based stenosis levels, using the diameter of the distal internal carotid lumen as the denominator for stenosis measurement. The following velocity measurements were obtained: RIGHT ICA:  107/34 cm/sec CCA:  [id number] cm/sec SYSTOLIC ICA/CCA RATIO:  0.6 DIASTOLIC ICA/CCA RATIO:  1.0 ECA:  159 cm/sec LEFT ICA:  160/40 cm/sec CCA:  [id number] cm/sec SYSTOLIC ICA/CCA RATIO:  1.0 DIASTOLIC ICA/CCA RATIO:  1.0 ECA:  117 cm/sec RIGHT CAROTID ARTERY: There is a very minimal amount of intimal thickening involving the right carotid bulb (Image 16), extending to involve the origin and proximal aspect the right internal carotid artery (image 23), morphologically similar to the 06/2014 examination and not definitely resulting in a hemodynamically significant  stenosis. Borderline elevated peak systolic velocities within the mid and distal aspects of the right internal carotid artery felt to be factitious Lee elevated due to sampling at a location of tortuous flow. RIGHT VERTEBRAL ARTERY:  Antegrade flow LEFT CAROTID ARTERY: There is a minimal amount of intimal thickening and eccentric hypoechoic plaque involving the origin and proximal aspects of the left internal carotid artery (image 56), morphologically similar to the 06/2014 examination and not definitely resulting in elevated peak systolic velocities within the left internal carotid artery. Borderline elevated peak systolic velocities throughout the left internal carotid artery are transmitted from the common carotid artery with preservation of the systolic and diastolic ICA/CCA ratios. LEFT VERTEBRAL ARTERY:  Antegrade flow IMPRESSION: Minimal amount of bilateral atherosclerotic plaque and intimal wall thickening, left greater than right, not resulting in a hemodynamically significant stenosis within either internal carotid artery. Electronically Signed   By: Sandi Mariscal M.D.   On: 03/26/2015 09:22   US Venous Img Lower Unilateral Left  03/25/2015  CLINICAL DATA:  Acute onset of left leg swelling. Initial encounter. EXAM: LEFT LOWER EXTREMITY VENOUS DOPPLER ULTRASOUND TECHNIQUE: Gray-scale sonography with graded compression, as well as color Doppler and duplex ultrasound were performed to evaluate the lower extremity deep venous systems from the level of the common femoral vein and including the common femoral, femoral, profunda femoral, popliteal and calf veins including the posterior tibial, peroneal and gastrocnemius veins when visible. The superficial great saphenous vein was also interrogated. Spectral Doppler was utilized to evaluate flow at rest and with distal augmentation maneuvers in the common femoral, femoral and popliteal veins. COMPARISON:  None. FINDINGS: Contralateral Common Femoral Vein:  Respiratory phasicity is normal and symmetric with the symptomatic side. No evidence of thrombus. Normal compressibility. Common Femoral Vein: No evidence of thrombus. Normal compressibility, respiratory phasicity and response to augmentation. Saphenofemoral Junction: No evidence of thrombus. Normal compressibility and flow on color Doppler imaging. Profunda  Femoral Vein: No evidence of thrombus. Normal compressibility and flow on color Doppler imaging. Femoral Vein: No evidence of thrombus. Normal compressibility, respiratory phasicity and response to augmentation. Popliteal Vein: No evidence of thrombus. Normal compressibility, respiratory phasicity and response to augmentation. Calf Veins: Not well seen on this study. Superficial Great Saphenous Vein: No evidence of thrombus. Normal compressibility and flow on color Doppler imaging. Venous Reflux:  None. Other Findings:  None. IMPRESSION: No evidence of deep venous thrombosis. Electronically Signed   By: Garald Balding M.D.   On: 03/25/2015 23:12    EKG:   Orders placed or performed during the hospital encounter of 03/25/15  . ED EKG  . ED EKG  . EKG 12-Lead  . EKG 12-Lead  . EKG 12-Lead  . EKG 12-Lead    ASSESSMENT AND PLAN:   [name] history of bipolar 1, COPD presenting with altered speech  1. Stroke ruled out , negative MRI   Continue Aspirin and statin therapy MRA of the brain is negative. Echocardiogram is neg- LVEF 55-60%  Carotid Dopplers with minimal atherosclerosis which is not significant  on telemetry,   lipid panel-LDL 102, on statin  hemoglobin A1c 5.6. Ruled out diabetes Neurology signed off  Therapy has recommended mechanical soft diet  Physical therapy has recommended SNf Patient's lethargy resolved . holding Seroquel and Lyrica tonight until patient is more awake and alert , discontinue trazodone as patient is not on trazodone at home   2. Left lower extremity edema:  Negative DVT on  venous  Doppler  3. Elevated troponin:  No events on telemetry, on  aspirin statin therapy,  Patient is asymptomatic  Non trending  cardiac enzymes 3  4. Rt 4th and 5 th finger numbness - prob 2/2 ulnar neuropathy  Anticipating spontaneous resolution   5. AKI - ivf, check bmp am   5. Venous thromboembolism prophylactic: SCDs  Disposition - SNF possibly tomorrow      All the records are reviewed and case discussed with Care Management/Social Workerr. Management plans discussed with the patient, family and they are in agreement.  CODE STATUS: Full code  TOTAL TIME TAKING CARE OF THIS PATIENT: 35  minutes.   POSSIBLE D/C IN 1-2  DAYS, DEPENDING ON CLINICAL CONDITION.   Nicholes Mango M.D on 03/27/2015 at 4:02 PM  Between 7am to 6pm - Pager - [phone] After 6pm go to [email or web address] - password [email or web address]  Office  [phone]  CC: Primary care physician; Bobetta Lime, MD

## 2015-03-27 NOTE — Progress Notes (Signed)
Subjective: Patient much improved this morning.  Reports numbness in the last two fingers on her right hand.    Objective: Current vital signs: BP 132/89 mmHg  Pulse 103  Temp(Src) 98.7 F (37.1 C) (Oral)  Resp 18  Ht 5\' 1"  (1.549 m)  Wt 114.669 kg (252 lb 12.8 oz)  BMI 47.79 kg/m2  SpO2 96%  LMP  (LMP Unknown) Vital signs in last 24 hours: Temp:  [97.8 F (36.6 C)-98.7 F (37.1 C)] 98.7 F (37.1 C) (01/29 0856) Pulse Rate:  [92-103] 103 (01/29 0856) Resp:  [18] 18 (01/29 0458) BP: (102-132)/(48-89) 132/89 mmHg (01/29 0856) SpO2:  [95 %-100 %] 96 % (01/29 0856)  Intake/Output from previous day: 01/28 0701 - 01/29 0700 In: 123 [P.O.:120; I.V.:3] Out: 1950 [Urine:1950] Intake/Output this shift:   Nutritional status: DIET DYS 3 Room service appropriate?: Yes; Fluid consistency:: Thin  Neurologic Exam: Mental Status: Alert, oriented, thought content appropriate. Speech fluent without evidence of aphasia. Does have some slurring of speech at times. Able to follow commands without difficulty. Cranial Nerves: II: Discs flat bilaterally; Visual fields grossly normal, pupils equal, round, reactive to light and accommodation III,IV, VI: ptosis not present, extra-ocular motions intact bilaterally V,VII: right facial, facial light touch sensation normal bilaterally VIII: hearing normal bilaterally IX,X: gag reflex present XI: bilateral shoulder shrug XII: midline tongue extension Motor: Right :Upper extremity 5/5Left: Upper extremity 5/5 Lower extremity 5/5Lower extremity 5/5 Sensation: Decreased light touch in the pinky and fourth digit on the RUE   Lab Results: Basic Metabolic Panel:  Recent Labs Lab 03/25/15 1837  NA 141  K 4.6  CL 102  CO2 30  GLUCOSE 100*  BUN 15  CREATININE 1.44*  CALCIUM 9.1    Liver Function Tests:  Recent Labs Lab  03/25/15 1837  AST 161*  ALT 230*  ALKPHOS 72  BILITOT 0.9  PROT 7.8  ALBUMIN 3.5   No results for input(s): LIPASE, AMYLASE in the last 168 hours. No results for input(s): AMMONIA in the last 168 hours.  CBC:  Recent Labs Lab 03/25/15 1837  WBC 9.2  NEUTROABS 6.3  HGB 13.7  HCT 42.3  MCV 92.8  PLT 153    Cardiac Enzymes:  Recent Labs Lab 03/25/15 1837 03/26/15 1622 03/26/15 2213  TROPONINI 0.08* 0.04* 0.05*    Lipid Panel:  Recent Labs Lab 03/26/15 0532  CHOL 160  TRIG 152*  HDL 28*  CHOLHDL 5.7  VLDL 30  LDLCALC 102*    CBG:  Recent Labs Lab 03/25/15 1840  GLUCAP 22    Microbiology: Results for orders placed or performed during the hospital encounter of 06/14/14  Urine culture     Status: None   Collection Time: 06/14/14  4:50 PM  Result Value Ref Range Status   Micro Text Report   Final       SOURCE: CLEAN CATCH    ORGANISM 1                >100,000 CFU/ML Candida glabrata   COMMENT                   -   ANTIBIOTIC                    ORG#1  Coagulation Studies:  Recent Labs  03/25/15 1837  LABPROT 13.4  INR 1.00    Imaging: Ct Head Wo Contrast  03/25/2015  CLINICAL DATA:  Left-sided weakness EXAM: CT HEAD WITHOUT CONTRAST TECHNIQUE: Contiguous axial images were obtained from the base of the skull through the vertex without intravenous contrast. COMPARISON:  07/02/2014 FINDINGS: No acute intracranial abnormality. Specifically, no hemorrhage, hydrocephalus, mass lesion, acute infarction, or significant intracranial injury. No acute calvarial abnormality. Visualized paranasal sinuses and mastoids clear. Orbital soft tissues unremarkable. IMPRESSION: Negative noncontrast head CT. Electronically Signed   By: Rolm Baptise M.D.   On: 03/25/2015 17:59   Mr Brain Wo Contrast  03/26/2015  CLINICAL DATA:  One day duration of speech disturbance. Difficulty with word finding. EXAM: MRI HEAD  WITHOUT CONTRAST TECHNIQUE: Multiplanar, multiecho pulse sequences of the brain and surrounding structures were obtained without intravenous contrast. COMPARISON:  Head CT 03/25/2015.  MRI 07/02/2014. FINDINGS: Diffusion imaging does not show any acute or subacute infarction. The brainstem and cerebellum are normal. The cerebral hemispheres show mild atrophy but no evidence of focal small or large vessel infarction. No mass lesion, hemorrhage, hydrocephalus or extra-axial collection. No pituitary mass. No inflammatory sinus disease. No skull or skullbase lesion. Major vessels at the base of the brain show flow. IMPRESSION: No acute or reversible finding. Mild brain volume loss. Otherwise normal. Electronically Signed   By: Nelson Chimes M.D.   On: 03/26/2015 13:56   US Carotid Bilateral  03/26/2015  CLINICAL DATA:  Stroke and visual disturbance.  History of smoking. EXAM: BILATERAL CAROTID DUPLEX ULTRASOUND TECHNIQUE: Pearline Cables scale imaging, color Doppler and duplex ultrasound were performed of bilateral carotid and vertebral arteries in the neck. COMPARISON:  Carotid Doppler ultrasound - 07/02/2014 FINDINGS: Criteria: Quantification of carotid stenosis is based on velocity parameters that correlate the residual internal carotid diameter with NASCET-based stenosis levels, using the diameter of the distal internal carotid lumen as the denominator for stenosis measurement. The following velocity measurements were obtained: RIGHT ICA:  107/34 cm/sec CCA:  [id number] cm/sec SYSTOLIC ICA/CCA RATIO:  0.6 DIASTOLIC ICA/CCA RATIO:  1.0 ECA:  159 cm/sec LEFT ICA:  160/40 cm/sec CCA:  [id number] cm/sec SYSTOLIC ICA/CCA RATIO:  1.0 DIASTOLIC ICA/CCA RATIO:  1.0 ECA:  117 cm/sec RIGHT CAROTID ARTERY: There is a very minimal amount of intimal thickening involving the right carotid bulb (Image 16), extending to involve the origin and proximal aspect the right internal carotid artery (image 23), morphologically similar to the 06/2014  examination and not definitely resulting in a hemodynamically significant stenosis. Borderline elevated peak systolic velocities within the mid and distal aspects of the right internal carotid artery felt to be factitious Lee elevated due to sampling at a location of tortuous flow. RIGHT VERTEBRAL ARTERY:  Antegrade flow LEFT CAROTID ARTERY: There is a minimal amount of intimal thickening and eccentric hypoechoic plaque involving the origin and proximal aspects of the left internal carotid artery (image 56), morphologically similar to the 06/2014 examination and not definitely resulting in elevated peak systolic velocities within the left internal carotid artery. Borderline elevated peak systolic velocities throughout the left internal carotid artery are transmitted from the common carotid artery with preservation of the systolic and diastolic ICA/CCA ratios. LEFT VERTEBRAL ARTERY:  Antegrade flow IMPRESSION: Minimal amount of bilateral atherosclerotic plaque and intimal wall thickening, left greater than right, not resulting in a hemodynamically significant stenosis within either internal carotid artery. Electronically Signed   By: Sandi Mariscal M.D.   On: 03/26/2015 09:22  US Venous Img Lower Unilateral Left  03/25/2015  CLINICAL DATA:  Acute onset of left leg swelling. Initial encounter. EXAM: LEFT LOWER EXTREMITY VENOUS DOPPLER ULTRASOUND TECHNIQUE: Gray-scale sonography with graded compression, as well as color Doppler and duplex ultrasound were performed to evaluate the lower extremity deep venous systems from the level of the common femoral vein and including the common femoral, femoral, profunda femoral, popliteal and calf veins including the posterior tibial, peroneal and gastrocnemius veins when visible. The superficial great saphenous vein was also interrogated. Spectral Doppler was utilized to evaluate flow at rest and with distal augmentation maneuvers in the common femoral, femoral and popliteal  veins. COMPARISON:  None. FINDINGS: Contralateral Common Femoral Vein: Respiratory phasicity is normal and symmetric with the symptomatic side. No evidence of thrombus. Normal compressibility. Common Femoral Vein: No evidence of thrombus. Normal compressibility, respiratory phasicity and response to augmentation. Saphenofemoral Junction: No evidence of thrombus. Normal compressibility and flow on color Doppler imaging. Profunda Femoral Vein: No evidence of thrombus. Normal compressibility and flow on color Doppler imaging. Femoral Vein: No evidence of thrombus. Normal compressibility, respiratory phasicity and response to augmentation. Popliteal Vein: No evidence of thrombus. Normal compressibility, respiratory phasicity and response to augmentation. Calf Veins: Not well seen on this study. Superficial Great Saphenous Vein: No evidence of thrombus. Normal compressibility and flow on color Doppler imaging. Venous Reflux:  None. Other Findings:  None. IMPRESSION: No evidence of deep venous thrombosis. Electronically Signed   By: Garald Balding M.D.   On: 03/25/2015 23:12    Medications:  I have reviewed the patient's current medications. Scheduled: . aspirin  300 mg Rectal Daily   Or  . aspirin  325 mg Oral Daily  . atorvastatin  40 mg Oral q1800  . divalproex  250 mg Oral BID  . escitalopram  20 mg Oral QODAY  . meloxicam  15 mg Oral Daily  . multivitamin with minerals  1 tablet Oral Daily  . sodium chloride flush  3 mL Intravenous Q12H    Assessment/Plan: Patient much improved today.  Complaints of numbness consistent with an ulnar neuropathy that likely occurred due to pressure when she was so lethargic.  Expect this to improve on its own.  Patient advised to prevent putting further pressure at the elbow.  MRI of the brain personally reviewed and shows no evidence of acute changes.  Echocardiogram shows no cardiac source of emboli and a normal EF.    Recommendations: 1.  Continue ASA 2.  No  further neurologic intervention is recommended at this time.  If further questions arise, please call or page at that time.  Thank you for allowing neurology to participate in the care of this patient.     Alexis Goodell, MD Neurology [phone] 03/27/2015  11:19 AM

## 2015-03-27 NOTE — Plan of Care (Signed)
Problem: Coping: Goal: Ability to identify strategies to decrease anxiety will improve Outcome: Progressing Pt on  meds for this calm/ cooperative. Voiding well. Will try to get oob. Possibly will go to snf tomorrow. neuros intact. Dr Doy Mince saw todayi

## 2015-03-28 DIAGNOSIS — R7989 Other specified abnormal findings of blood chemistry: Secondary | ICD-10-CM | POA: Diagnosis not present

## 2015-03-28 DIAGNOSIS — R5383 Other fatigue: Secondary | ICD-10-CM | POA: Diagnosis not present

## 2015-03-28 DIAGNOSIS — R600 Localized edema: Secondary | ICD-10-CM | POA: Diagnosis not present

## 2015-03-28 DIAGNOSIS — G5621 Lesion of ulnar nerve, right upper limb: Secondary | ICD-10-CM | POA: Diagnosis not present

## 2015-03-28 DIAGNOSIS — N179 Acute kidney failure, unspecified: Secondary | ICD-10-CM | POA: Diagnosis not present

## 2015-03-28 DIAGNOSIS — I639 Cerebral infarction, unspecified: Secondary | ICD-10-CM | POA: Diagnosis not present

## 2015-03-28 LAB — BASIC METABOLIC PANEL
Anion gap: 6 (ref 5–15)
BUN: 13 mg/dL (ref 6–20)
CHLORIDE: 107 mmol/L (ref 101–111)
CO2: 27 mmol/L (ref 22–32)
CREATININE: 1.19 mg/dL — AB (ref 0.44–1.00)
Calcium: 8.3 mg/dL — ABNORMAL LOW (ref 8.9–10.3)
GFR, EST AFRICAN AMERICAN: 58 mL/min — AB (ref >60)
GFR, EST NON AFRICAN AMERICAN: 50 mL/min — AB (ref >60)
Glucose, Bld: 99 mg/dL (ref 65–99)
POTASSIUM: 3.3 mmol/L — AB (ref 3.5–5.1)
SODIUM: 140 mmol/L (ref 135–145)

## 2015-03-28 MED ORDER — ATORVASTATIN CALCIUM 40 MG PO TABS: 40.0000 mg | ORAL_TABLET | Freq: Every day | ORAL | Status: DC

## 2015-03-28 MED ORDER — ACETAMINOPHEN 325 MG PO TABS: 650.0000 mg | ORAL_TABLET | Freq: Four times a day (QID) | ORAL | Status: DC | PRN

## 2015-03-28 MED ORDER — ASPIRIN EC 81 MG PO TBEC
81.0000 mg | DELAYED_RELEASE_TABLET | Freq: Every day | ORAL | Status: DC
Start: 2015-03-28 — End: 2015-06-21

## 2015-03-28 NOTE — Progress Notes (Signed)
Occupational Therapy Treatment Patient Details Name: Nicole Austin MRN: [id number] DOB: Aug 13, 1960 Today's Date: 03/28/2015    History of present illness Patient is a 55 yo old female with a history of COPD, denies use of oxygen at home, bipolar type 1, anxiety.  She reports living at home in an apartment alone, however, nursing reports patient has been staying at her sisters house more recently.  Patient states her sister felt she had a blank stare, had difficulty speaking and called EMS.  She was admitted to [facility] to rule out CVA.  She reports she had been independent prior to admission but also had home health services almost daily to assist with housekeeping and some self care tasks.  Patient appears to be a poor historian.    OT comments  Patient doing much better today than at evaluation. She is now able to self feed with out assistive device. Fine motor is still slow but able to complete 9 hole peg test in 108 seconds with cues for technique. Patient has discharge orders.  Follow Up Recommendations       Equipment Recommendations       Recommendations for Other Services      Precautions / Restrictions Precautions Precautions: Fall Precaution Comments: Pt has had 2 falls, reports she had throw rugs removed Restrictions Weight Bearing Restrictions: No       Mobility Bed Mobility  Transfers            Balance                          ADL                                                Vision                     Perception     Praxis      Cognition   Behavior During Therapy: WFL for tasks assessed/performed Overall Cognitive Status: Difficult to assess (Appears likely baseline)                       Extremity/Trunk Assessment               Exercises     Shoulder Instructions       General Comments      Pertinent Vitals/ Pain       Pain Assessment: No/denies pain  Home Living                                           Prior Functioning/Environment              Frequency       Progress Toward Goals  OT Goals(current goals can now be found in the care plan section)     Acute Rehab OT Goals Patient Stated Goal: Be able to take care of myself  Plan      Co-evaluation                 End of Session Equipment Utilized During Treatment:  (9 hole peg test)   Activity Tolerance     Patient Left     Nurse  Communication          Time: [id number] OT Time Calculation (min): 13 min  Charges: OT General Charges $OT Visit: 1 Procedure OT Treatments $Neuromuscular Re-education: 8-22 mins Sharon Mt, MS/OTR/L  Sharon Mt 03/28/2015, 11:51 AM

## 2015-03-28 NOTE — Progress Notes (Signed)
Sister on Unit for transport. Discharged via wheelchair by nursing staff. Belongings sent with patient and family.

## 2015-03-28 NOTE — Progress Notes (Signed)
MD making rounds. Discharge orders received. IV discontinued. Education handouts provided. Discharge paperwork provided, explained and signed. No unanswered questions. Awaiting transportation.

## 2015-03-28 NOTE — Care Management (Signed)
Admitted to [facility] with the diagnosis of stroke. Lives alone in her apartment in [place] x 7 years in February. Last seen Dr. Nadine Counts less than a month ago. Sister is Lisbeth Renshaw [phone]). Chronic home oxygen was established 02/27/15 during that admission here at [facility]. [facility] in the past. Home Health/Physical therapy 3-4 years ago. Doesn't remember the name of the agency. Uses a rollator and wheelchair to aid in getting around. States she has been getting services thru [facility]. No falls. Fair appetite. Physical therapy evaluation completed. Skilled Nursing vs Home Health. States she prefers to go home with home health thru [place]. Sister will transport. Shelbie Ammons RN MSN CCM Care management [phone]

## 2015-03-28 NOTE — Discharge Summary (Signed)
[facility] [place] NAME: Nicole Austin    MR#:  [id number]  DATE OF BIRTH:  21-Sep-1960  DATE OF ADMISSION:  03/25/2015 ADMITTING PHYSICIAN: Lytle Butte, MD  DATE OF DISCHARGE: 03/28/2015 PRIMARY CARE PHYSICIAN: Bobetta Lime, MD    ADMISSION DIAGNOSIS:  Edema [R60.9] Elevated troponin [R79.89] Cerebral infarction due to unspecified mechanism [I63.9]  DISCHARGE DIAGNOSIS:  Right-sided ulnar neuropathy Left leg edema  SECONDARY DIAGNOSIS:   Past Medical History  Diagnosis Date  . COPD (chronic obstructive pulmonary disease) ([place])   . PTSD (post-traumatic stress disorder)   . Rhabdomyolysis   . Bipolar 1 disorder ([place])   . Arthritis   . Stroke ([place])   . MI (myocardial infarction) ([place])   . Depression   . ADHD (attention deficit hyperactivity disorder)   . Asthma     HOSPITAL COURSE:   [name] history of bipolar 1, COPD presenting with altered speech  1. Stroke ruled out , negative MRI  Continue Aspirin and statin therapy MRA of the brain is negative. Echocardiogram is neg- LVEF 55-60% Carotid Dopplers with minimal atherosclerosis which is not significant Monitor patient on telemetry no arrhythmias lipid panel-LDL 102, on statin hemoglobin A1c 5.6. Ruled out diabetes Neurology seen the patient during hospital course. Appreciate their recommendations Therapy has recommended mechanical soft diet  Physical therapy has recommended SNf initially but today patient did good job and they're recommending home PT Patient's lethargy resolved .  , discontinue trazodone as patient is not on trazodone at home   2. Left lower extremity edema: Improving. Negative DVT on venous Doppler  3. Elevated troponin:  No events on telemetry, on aspirin statin therapy,  Patient is asymptomatic  Non trending cardiac enzymes 3  4. Rt 4th and 5 th finger numbness - prob 2/2 ulnar neuropathy   Anticipating spontaneous resolution   5. AKI - creatinine 1.44-1.19, improved with ivf  5. Venous thromboembolism prophylactic: SCDs  Disposition -home with home health    DISCHARGE CONDITIONS:   Fair  CONSULTS OBTAINED:  Treatment Team:  Lytle Butte, MD Catarina Hartshorn, MD   PROCEDURES none  DRUG ALLERGIES:   Allergies  Allergen Reactions  . Codeine Itching    DISCHARGE MEDICATIONS:   Current Discharge Medication List    START taking these medications   Details  acetaminophen (TYLENOL) 325 MG tablet Take 2 tablets (650 mg total) by mouth every 6 (six) hours as needed for mild pain (or Fever >/= 101).    aspirin EC 81 MG tablet Take 1 tablet (81 mg total) by mouth daily.    atorvastatin (LIPITOR) 40 MG tablet Take 1 tablet (40 mg total) by mouth daily at 6 PM. Qty: 30 tablet, Refills: 0      CONTINUE these medications which have NOT CHANGED   Details  albuterol (PROVENTIL HFA;VENTOLIN HFA) 108 (90 Base) MCG/ACT inhaler Inhale 1-2 puffs into the lungs every 6 (six) hours as needed for wheezing or shortness of breath. Qty: 6.7 g, Refills: 1   Associated Diagnoses: COPD, moderate (HCC)    ALPRAZolam (XANAX) 1 MG tablet Take 1 tablet (1 mg total) by mouth 4 (four) times daily as needed for anxiety. Qty: 120 tablet, Refills: 2    cyclobenzaprine (FLEXERIL) 5 MG tablet Take 2 tablets (10 mg total) by mouth 3 (three) times daily as needed for muscle spasms. Qty: 90 tablet, Refills: 5   Associated Diagnoses: Muscle spasms of both lower extremities  divalproex (DEPAKOTE) 250 MG DR tablet Take 1 tablet (250 mg total) by mouth 2 (two) times daily. Qty: 60 tablet, Refills: 1    escitalopram (LEXAPRO) 20 MG tablet Take 20 mg by mouth every other day.    glucosamine-chondroitin 500-400 MG tablet Take 1 tablet by mouth 2 (two) times daily.    ipratropium-albuterol (DUONEB) 0.5-2.5 (3) MG/3ML SOLN Take 3 mLs by nebulization every 4 (four) hours as needed. Qty:  360 mL, Refills: 6    lidocaine (XYLOCAINE) 5 % ointment Apply 1 application topically as needed for mild pain.    LYRICA 200 MG capsule Take 1 capsule (200 mg total) by mouth 2 (two) times daily. Qty: 60 capsule, Refills: 5   Associated Diagnoses: Chronic pain disorder    Multiple Vitamin (MULTIVITAMIN WITH MINERALS) TABS tablet Take 1 tablet by mouth daily.    QUEtiapine (SEROQUEL XR) 300 MG 24 hr tablet Take 300 mg by mouth at bedtime.    traMADol (ULTRAM) 50 MG tablet Take 1 tablet (50 mg total) by mouth 2 (two) times daily. Qty: 60 tablet, Refills: 0    naproxen (NAPROSYN) 500 MG tablet Take 1 tablet (500 mg total) by mouth 2 (two) times daily with a meal. Qty: 30 tablet, Refills: 0      STOP taking these medications     meloxicam (MOBIC) 15 MG tablet      traZODone (DESYREL) 100 MG tablet      predniSONE (STERAPRED UNI-PAK 21 TAB) 10 MG (21) TBPK tablet          DISCHARGE INSTRUCTIONS:   Follow-up with primary care physician in a week  DIET:  Cardiac diet  DISCHARGE CONDITION:  Fair  ACTIVITY:  Activity as tolerated per home health PT recommends  OXYGEN:  Home Oxygen: No.   Oxygen Delivery: room air  DISCHARGE LOCATION:  home   If you experience worsening of your admission symptoms, develop shortness of breath, life threatening emergency, suicidal or homicidal thoughts you must seek medical attention immediately by calling 911 or calling your MD immediately  if symptoms less severe.  You Must read complete instructions/literature along with all the possible adverse reactions/side effects for all the Medicines you take and that have been prescribed to you. Take any new Medicines after you have completely understood and accpet all the possible adverse reactions/side effects.   Please note  You were cared for by a hospitalist during your hospital stay. If you have any questions about your discharge medications or the care you received while you were in the  hospital after you are discharged, you can call the unit and asked to speak with the hospitalist on call if the hospitalist that took care of you is not available. Once you are discharged, your primary care physician will handle any further medical issues. Please note that NO REFILLS for any discharge medications will be authorized once you are discharged, as it is imperative that you return to your primary care physician (or establish a relationship with a primary care physician if you do not have one) for your aftercare needs so that they can reassess your need for medications and monitor your lab values.     Today  Chief Complaint  Patient presents with  . Code Stroke   Patient is feeling fine. Right fourth and fifth fingers numbness is better but not completely resolved. Denies any speech problems or dysphagia. Wants to go home with home health  ROS:  CONSTITUTIONAL: Denies fevers, chills. Denies any fatigue,  weakness.  EYES: Denies blurry vision, double vision, eye pain. EARS, NOSE, THROAT: Denies tinnitus, ear pain, hearing loss. RESPIRATORY: Denies cough, wheeze, shortness of breath.  CARDIOVASCULAR: Denies chest pain, palpitations, edema.  GASTROINTESTINAL: Denies nausea, vomiting, diarrhea, abdominal pain. Denies bright red blood per rectum. GENITOURINARY: Denies dysuria, hematuria. ENDOCRINE: Denies nocturia or thyroid problems. HEMATOLOGIC AND LYMPHATIC: Denies easy bruising or bleeding. SKIN: Denies rash or lesion. MUSCULOSKELETAL: Denies pain in neck, back, shoulder, knees, hips or arthritic symptoms.  NEUROLOGIC: Denies paralysis, paresthesias. Right hand fourth and fifth fingers are numb PSYCHIATRIC: Denies anxiety or depressive symptoms.   VITAL SIGNS:  Blood pressure 105/41, pulse 86, temperature 98.3 F (36.8 C), temperature source Oral, resp. rate 20, height 5\' 1"  (1.549 m), weight 114.669 kg (252 lb 12.8 oz), SpO2 96 %.  I/O:    Intake/Output Summary (Last 24  hours) at 03/28/15 1314 Last data filed at 03/28/15 1100  Gross per 24 hour  Intake    435 ml  Output   1525 ml  Net  -1090 ml    PHYSICAL EXAMINATION:  GENERAL:  55 y.o.-year-old patient lying in the bed with no acute distress.  EYES: Pupils equal, round, reactive to light and accommodation. No scleral icterus. Extraocular muscles intact.  HEENT: Head atraumatic, normocephalic. Oropharynx and nasopharynx clear.  NECK:  Supple, no jugular venous distention. No thyroid enlargement, no tenderness.  LUNGS: Normal breath sounds bilaterally, no wheezing, rales,rhonchi or crepitation. No use of accessory muscles of respiration.  CARDIOVASCULAR: S1, S2 normal. No murmurs, rubs, or gallops.  ABDOMEN: Soft, non-tender, non-distended. Bowel sounds present. No organomegaly or mass.  EXTREMITIES: No pedal edema, cyanosis, or clubbing.  NEUROLOGIC: Cranial nerves II through XII are intact. Muscle strength 5/5 in all extremities. Sensation intact. Gait not checked. Right hand fourth and fifth finger with decreased touch sensation PSYCHIATRIC: The patient is alert and oriented x 3.  SKIN: No obvious rash, lesion, or ulcer.   DATA REVIEW:   CBC  Recent Labs Lab 03/25/15 1837  WBC 9.2  HGB 13.7  HCT 42.3  PLT 153    Chemistries   Recent Labs Lab 03/25/15 1837 03/28/15 0434  NA 141 140  K 4.6 3.3*  CL 102 107  CO2 30 27  GLUCOSE 100* 99  BUN 15 13  CREATININE 1.44* 1.19*  CALCIUM 9.1 8.3*  AST 161*  --   ALT 230*  --   ALKPHOS 72  --   BILITOT 0.9  --     Cardiac Enzymes  Recent Labs Lab 03/26/15 2213  TROPONINI 0.05*    Microbiology Results  Results for orders placed or performed during the hospital encounter of 06/14/14  Urine culture     Status: None   Collection Time: 06/14/14  4:50 PM  Result Value Ref Range Status   Micro Text Report   Final       SOURCE: CLEAN CATCH    ORGANISM 1                >100,000 CFU/ML Candida glabrata   COMMENT                    -   ANTIBIOTIC                    ORG#1  RADIOLOGY:  Ct Head Wo Contrast  03/25/2015  CLINICAL DATA:  Left-sided weakness EXAM: CT HEAD WITHOUT CONTRAST TECHNIQUE: Contiguous axial images were obtained from the base of the skull through the vertex without intravenous contrast. COMPARISON:  07/02/2014 FINDINGS: No acute intracranial abnormality. Specifically, no hemorrhage, hydrocephalus, mass lesion, acute infarction, or significant intracranial injury. No acute calvarial abnormality. Visualized paranasal sinuses and mastoids clear. Orbital soft tissues unremarkable. IMPRESSION: Negative noncontrast head CT. Electronically Signed   By: Rolm Baptise M.D.   On: 03/25/2015 17:59   Mr Brain Wo Contrast  03/26/2015  CLINICAL DATA:  One day duration of speech disturbance. Difficulty with word finding. EXAM: MRI HEAD WITHOUT CONTRAST TECHNIQUE: Multiplanar, multiecho pulse sequences of the brain and surrounding structures were obtained without intravenous contrast. COMPARISON:  Head CT 03/25/2015.  MRI 07/02/2014. FINDINGS: Diffusion imaging does not show any acute or subacute infarction. The brainstem and cerebellum are normal. The cerebral hemispheres show mild atrophy but no evidence of focal small or large vessel infarction. No mass lesion, hemorrhage, hydrocephalus or extra-axial collection. No pituitary mass. No inflammatory sinus disease. No skull or skullbase lesion. Major vessels at the base of the brain show flow. IMPRESSION: No acute or reversible finding. Mild brain volume loss. Otherwise normal. Electronically Signed   By: Nelson Chimes M.D.   On: 03/26/2015 13:56   US Carotid Bilateral  03/26/2015  CLINICAL DATA:  Stroke and visual disturbance.  History of smoking. EXAM: BILATERAL CAROTID DUPLEX ULTRASOUND TECHNIQUE: Pearline Cables scale imaging, color Doppler and duplex ultrasound were performed of bilateral carotid and vertebral arteries in the neck.  COMPARISON:  Carotid Doppler ultrasound - 07/02/2014 FINDINGS: Criteria: Quantification of carotid stenosis is based on velocity parameters that correlate the residual internal carotid diameter with NASCET-based stenosis levels, using the diameter of the distal internal carotid lumen as the denominator for stenosis measurement. The following velocity measurements were obtained: RIGHT ICA:  107/34 cm/sec CCA:  [id number] cm/sec SYSTOLIC ICA/CCA RATIO:  0.6 DIASTOLIC ICA/CCA RATIO:  1.0 ECA:  159 cm/sec LEFT ICA:  160/40 cm/sec CCA:  [id number] cm/sec SYSTOLIC ICA/CCA RATIO:  1.0 DIASTOLIC ICA/CCA RATIO:  1.0 ECA:  117 cm/sec RIGHT CAROTID ARTERY: There is a very minimal amount of intimal thickening involving the right carotid bulb (Image 16), extending to involve the origin and proximal aspect the right internal carotid artery (image 23), morphologically similar to the 06/2014 examination and not definitely resulting in a hemodynamically significant stenosis. Borderline elevated peak systolic velocities within the mid and distal aspects of the right internal carotid artery felt to be factitious Lee elevated due to sampling at a location of tortuous flow. RIGHT VERTEBRAL ARTERY:  Antegrade flow LEFT CAROTID ARTERY: There is a minimal amount of intimal thickening and eccentric hypoechoic plaque involving the origin and proximal aspects of the left internal carotid artery (image 56), morphologically similar to the 06/2014 examination and not definitely resulting in elevated peak systolic velocities within the left internal carotid artery. Borderline elevated peak systolic velocities throughout the left internal carotid artery are transmitted from the common carotid artery with preservation of the systolic and diastolic ICA/CCA ratios. LEFT VERTEBRAL ARTERY:  Antegrade flow IMPRESSION: Minimal amount of bilateral atherosclerotic plaque and intimal wall thickening, left greater than right, not resulting in a hemodynamically  significant stenosis within either internal carotid artery. Electronically Signed   By: Sandi Mariscal M.D.   On: 03/26/2015 09:22   US Venous Img Lower Unilateral Left  03/25/2015  CLINICAL DATA:  Acute onset of  left leg swelling. Initial encounter. EXAM: LEFT LOWER EXTREMITY VENOUS DOPPLER ULTRASOUND TECHNIQUE: Gray-scale sonography with graded compression, as well as color Doppler and duplex ultrasound were performed to evaluate the lower extremity deep venous systems from the level of the common femoral vein and including the common femoral, femoral, profunda femoral, popliteal and calf veins including the posterior tibial, peroneal and gastrocnemius veins when visible. The superficial great saphenous vein was also interrogated. Spectral Doppler was utilized to evaluate flow at rest and with distal augmentation maneuvers in the common femoral, femoral and popliteal veins. COMPARISON:  None. FINDINGS: Contralateral Common Femoral Vein: Respiratory phasicity is normal and symmetric with the symptomatic side. No evidence of thrombus. Normal compressibility. Common Femoral Vein: No evidence of thrombus. Normal compressibility, respiratory phasicity and response to augmentation. Saphenofemoral Junction: No evidence of thrombus. Normal compressibility and flow on color Doppler imaging. Profunda Femoral Vein: No evidence of thrombus. Normal compressibility and flow on color Doppler imaging. Femoral Vein: No evidence of thrombus. Normal compressibility, respiratory phasicity and response to augmentation. Popliteal Vein: No evidence of thrombus. Normal compressibility, respiratory phasicity and response to augmentation. Calf Veins: Not well seen on this study. Superficial Great Saphenous Vein: No evidence of thrombus. Normal compressibility and flow on color Doppler imaging. Venous Reflux:  None. Other Findings:  None. IMPRESSION: No evidence of deep venous thrombosis. Electronically Signed   By: Garald Balding M.D.    On: 03/25/2015 23:12    EKG:   Orders placed or performed during the hospital encounter of 03/25/15  . ED EKG  . ED EKG  . EKG 12-Lead  . EKG 12-Lead  . EKG 12-Lead  . EKG 12-Lead      Management plans discussed with the patient, family and they are in agreement.  CODE STATUS:     Code Status Orders        Start     Ordered   03/25/15 2119  Full code   Continuous     03/25/15 2118    Code Status History    Date Active Date Inactive Code Status Order ID Comments User Context   03/02/2015  1:28 AM 03/08/2015  1:49 PM Full Code [id number]  Lytle Butte, MD ED   07/02/2014  3:15 PM 07/03/2014  5:27 PM Full Code [id number]  Loletha Grayer, MD ED      TOTAL TIME TAKING CARE OF THIS PATIENT: 45 minutes.    @[facility] @  on 03/28/2015 at 1:14 PM  Between 7am to 6pm - Pager - [phone]  After 6pm go to [email or web address] - password [email or web address] [facility]  Office  [phone]  CC: Primary care physician; Bobetta Lime, MD

## 2015-03-28 NOTE — Discharge Instructions (Signed)
Activity as tolerated per home health PT Diet cardiac Follow-up with primary care physician in a week

## 2015-03-28 NOTE — Progress Notes (Signed)
Physical Therapy Treatment Patient Details Name: Nicole Austin MRN: [id number] DOB: 06-Nov-1960 Today's Date: 03/28/2015    History of Present Illness Patient is a 55 yo old female with a history of COPD, denies use of oxygen at home, bipolar type 1, anxiety.  She reports living at home in an apartment alone, however, nursing reports patient has been staying at her sisters house more recently.  Patient states her sister felt she had a blank stare, had difficulty speaking and called EMS.  She was admitted to [facility] to rule out CVA.  She reports she had been independent prior to admission but also had home health services almost daily to assist with housekeeping and some self care tasks.  Patient appears to be a poor historian.     PT Comments    Pt demonstrates considerable improvement in mobility and strength compared to initial evaluation. She is no longer complaining of unilateral weakness. Slurring has improved and pt reports that mobility has returned to baseline. Pt able to considerably increase ambulation distance. Pt denies DOE and vitals are WNL following ambulation on room air. Pt reports she has home O2 following last admission and is unclear if she is supposed to continue utilizing. CSW notified to clarify with patient. Pt is appropriate to discharge home with [facility] PT. Discharge recommendations updated and discharge planning team notified. Pt will benefit from skilled PT services to address deficits in strength, balance, and mobility in order to return to full function at home.    Follow Up Recommendations  Home health PT     Equipment Recommendations  None recommended by PT    Recommendations for Other Services       Precautions / Restrictions Precautions Precautions: Fall Precaution Comments: Pt has had 2 falls, reports she had throw rugs removed Restrictions Weight Bearing Restrictions: No    Mobility  Bed Mobility Overal bed mobility: Modified Independent Bed  Mobility: Supine to Sit;Sit to Supine Rolling: Modified independent (Device/Increase time)   Supine to sit: Modified independent (Device/Increase time) Sit to supine: Modified independent (Device/Increase time)   General bed mobility comments: Pt demonstrates good technique with rolling and coming up to EOB. Considerable improvement in speed and strength noted  Transfers Overall transfer level: Modified independent Equipment used: Rolling walker (2 wheeled) Transfers: Sit to/from Stand Sit to Stand: Min guard         General transfer comment: Pt is able to come to standing with improving LE strength and stability. Able to maintain static standing balance without UE assist but clearly more comfortable with rolling walker. Pt does require 2 attempts due to poor positioning during first attempt  Ambulation/Gait Ambulation/Gait assistance: Min guard Ambulation Distance (Feet): 150 Feet Assistive device: Rolling walker (2 wheeled) Gait Pattern/deviations: Step-through pattern Gait velocity: Decreased but fully functional for hosuehold mobility   General Gait Details: Pt with slow gait speed initially but improves throughout ambulation distance. Able to increase her step length as distance progresses. Good scanning of visual environment noted. Overall gait appears to get easier with extended distance. Pt with progressively less reliance on UE from rolling walker. Denies DOE. Upon returning to bed SaO2 measured at 94% on room air and HR 101.   Stairs            Wheelchair Mobility    Modified Rankin (Stroke Patients Only)       Balance Overall balance assessment: Needs assistance Sitting-balance support: No upper extremity supported Sitting balance-Leahy Scale: Good     Standing  balance support: No upper extremity supported Standing balance-Leahy Scale: Fair                      Cognition Arousal/Alertness: Awake/alert Behavior During Therapy: WFL for tasks  assessed/performed Overall Cognitive Status: Difficult to assess (Appears likely baseline)                      Exercises      General Comments        Pertinent Vitals/Pain Pain Assessment: No/denies pain    Home Living                      Prior Function            PT Goals (current goals can now be found in the care plan section) Acute Rehab PT Goals Patient Stated Goal: Be able to take care of myself PT Goal Formulation: With patient Time For Goal Achievement: 04/09/15 Potential to Achieve Goals: Good Progress towards PT goals: Progressing toward goals    Frequency  Min 2X/week    PT Plan Discharge plan needs to be updated    Co-evaluation             End of Session Equipment Utilized During Treatment: Gait belt Activity Tolerance: Patient tolerated treatment well Patient left: with bed alarm set;with call bell/phone within reach;in bed     Time: 1022-1031 PT Time Calculation (min) (ACUTE ONLY): 9 min  Charges:  $Gait Training: 8-22 mins                    G Codes:      Lyndel Safe Huprich PT, DPT   Huprich,Jason 03/28/2015, 11:02 AM

## 2015-03-29 DIAGNOSIS — J45909 Unspecified asthma, uncomplicated: Secondary | ICD-10-CM | POA: Diagnosis not present

## 2015-03-29 DIAGNOSIS — G5621 Lesion of ulnar nerve, right upper limb: Secondary | ICD-10-CM | POA: Diagnosis not present

## 2015-03-29 DIAGNOSIS — Z96652 Presence of left artificial knee joint: Secondary | ICD-10-CM | POA: Diagnosis not present

## 2015-03-29 DIAGNOSIS — F4310 Post-traumatic stress disorder, unspecified: Secondary | ICD-10-CM | POA: Diagnosis not present

## 2015-03-29 DIAGNOSIS — R2689 Other abnormalities of gait and mobility: Secondary | ICD-10-CM | POA: Diagnosis not present

## 2015-03-29 DIAGNOSIS — J449 Chronic obstructive pulmonary disease, unspecified: Secondary | ICD-10-CM | POA: Diagnosis not present

## 2015-03-29 DIAGNOSIS — F17210 Nicotine dependence, cigarettes, uncomplicated: Secondary | ICD-10-CM | POA: Diagnosis not present

## 2015-03-29 DIAGNOSIS — F319 Bipolar disorder, unspecified: Secondary | ICD-10-CM | POA: Diagnosis not present

## 2015-03-29 DIAGNOSIS — M6281 Muscle weakness (generalized): Secondary | ICD-10-CM | POA: Diagnosis not present

## 2015-03-29 NOTE — Clinical Social Work Note (Signed)
Clinical Social Work Assessment  Patient Details  Name: Nicole Austin MRN: [id number] Date of Birth: [birth year]  Date of referral:  03/27/15               Reason for consult:  Facility Placement                Permission sought to share information with:  Family Supports Permission granted to share information::  Yes, Verbal Permission Granted  Name::     Santiago Glad, roommate   Housing/Transportation Living arrangements for the past 2 months:  Apartment Source of Information:  Patient Patient Interpreter Needed:  None Criminal Activity/Legal Involvement Pertinent to Current Situation/Hospitalization:  No - Comment as needed Significant Relationships:  Adult Children, Friend Lives with:  Adult Children, Friends Do you feel safe going back to the place where you live?  Yes Need for family participation in patient care:  No (Coment)  Care giving concerns:  PT is recommending SNF, however pt is declining placement.    Social Worker assessment / plan:  CSW met with pt to address consult for New SNF. CSW introduced herself and explained role of social work. CSW also explained the process of discharging to SNF with a managed medicare. Pt was very tearful as she does not want to go to a facility. Pt would like to return home with home health services. Pt stated that pt will return home and her roommate will be able to provide the 24 hour supervision needed, if necessary.    CSW provided supportive counseling to pt around her independence and want to return home. Pt is seen by a therapist and has a comfort animal.   CSW will update RNCM of pt's wishes. CSW will continue to follow.   Employment status:  Disabled (Comment on whether or not currently receiving Disability) Insurance information:   Programmer, applications.  PT Recommendations:  [place] / Referral to community resources:  [place]  Patient/Family's Response to care:  Pt was appreciative of  CSW support.   Patient/Family's Understanding of and Emotional Response to Diagnosis, Current Treatment, and Prognosis:  Pt would like to return home rather than STR.   Emotional Assessment Appearance:  Appears stated age Attitude/Demeanor/Rapport:  Guarded Affect (typically observed):  Tearful/Crying Orientation:  Oriented to Self, Oriented to Place, Oriented to  Time, Oriented to Situation Alcohol / Substance use:  Never Used Psych involvement (Current and /or in the community):  No (Comment)  Discharge Needs  Concerns to be addressed:  Patient refuses services, Adjustment to Illness Readmission within the last 30 days:  Yes Current discharge risk:  Chronically ill Barriers to Discharge:  Continued Medical Work up   [employer], LCSW 03/29/2015, 8:55 PM

## 2015-03-29 NOTE — Clinical Social Work Note (Signed)
Pt is ready for discharge today. Pt was revaluated by PT and pt is safe to return home. Pt was appreciative of CSW. RNCM will follow for home health services. CSW is signing off as no further needs identified.   Darden Dates, MSW, LCSW  Clinical Social Worker  [phone]

## 2015-03-30 DIAGNOSIS — J45909 Unspecified asthma, uncomplicated: Secondary | ICD-10-CM | POA: Diagnosis not present

## 2015-03-30 DIAGNOSIS — F17210 Nicotine dependence, cigarettes, uncomplicated: Secondary | ICD-10-CM | POA: Diagnosis not present

## 2015-03-30 DIAGNOSIS — Z96652 Presence of left artificial knee joint: Secondary | ICD-10-CM | POA: Diagnosis not present

## 2015-03-30 DIAGNOSIS — M6281 Muscle weakness (generalized): Secondary | ICD-10-CM | POA: Diagnosis not present

## 2015-03-30 DIAGNOSIS — G5621 Lesion of ulnar nerve, right upper limb: Secondary | ICD-10-CM | POA: Diagnosis not present

## 2015-03-30 DIAGNOSIS — F319 Bipolar disorder, unspecified: Secondary | ICD-10-CM | POA: Diagnosis not present

## 2015-03-30 DIAGNOSIS — J449 Chronic obstructive pulmonary disease, unspecified: Secondary | ICD-10-CM | POA: Diagnosis not present

## 2015-03-30 DIAGNOSIS — R2689 Other abnormalities of gait and mobility: Secondary | ICD-10-CM | POA: Diagnosis not present

## 2015-03-30 DIAGNOSIS — F4310 Post-traumatic stress disorder, unspecified: Secondary | ICD-10-CM | POA: Diagnosis not present

## 2015-04-05 ENCOUNTER — Ambulatory Visit: Payer: Commercial Managed Care - HMO | Admitting: Psychiatry

## 2015-04-05 DIAGNOSIS — M6281 Muscle weakness (generalized): Secondary | ICD-10-CM | POA: Diagnosis not present

## 2015-04-05 DIAGNOSIS — G5621 Lesion of ulnar nerve, right upper limb: Secondary | ICD-10-CM | POA: Diagnosis not present

## 2015-04-05 DIAGNOSIS — F4310 Post-traumatic stress disorder, unspecified: Secondary | ICD-10-CM | POA: Diagnosis not present

## 2015-04-05 DIAGNOSIS — J45909 Unspecified asthma, uncomplicated: Secondary | ICD-10-CM | POA: Diagnosis not present

## 2015-04-05 DIAGNOSIS — F319 Bipolar disorder, unspecified: Secondary | ICD-10-CM | POA: Diagnosis not present

## 2015-04-05 DIAGNOSIS — R2689 Other abnormalities of gait and mobility: Secondary | ICD-10-CM | POA: Diagnosis not present

## 2015-04-05 DIAGNOSIS — J449 Chronic obstructive pulmonary disease, unspecified: Secondary | ICD-10-CM | POA: Diagnosis not present

## 2015-04-05 DIAGNOSIS — Z96652 Presence of left artificial knee joint: Secondary | ICD-10-CM | POA: Diagnosis not present

## 2015-04-05 DIAGNOSIS — F17210 Nicotine dependence, cigarettes, uncomplicated: Secondary | ICD-10-CM | POA: Diagnosis not present

## 2015-04-06 DIAGNOSIS — Z96652 Presence of left artificial knee joint: Secondary | ICD-10-CM | POA: Diagnosis not present

## 2015-04-06 DIAGNOSIS — F4310 Post-traumatic stress disorder, unspecified: Secondary | ICD-10-CM | POA: Diagnosis not present

## 2015-04-06 DIAGNOSIS — F319 Bipolar disorder, unspecified: Secondary | ICD-10-CM | POA: Diagnosis not present

## 2015-04-06 DIAGNOSIS — J45909 Unspecified asthma, uncomplicated: Secondary | ICD-10-CM | POA: Diagnosis not present

## 2015-04-06 DIAGNOSIS — G5621 Lesion of ulnar nerve, right upper limb: Secondary | ICD-10-CM | POA: Diagnosis not present

## 2015-04-06 DIAGNOSIS — J449 Chronic obstructive pulmonary disease, unspecified: Secondary | ICD-10-CM | POA: Diagnosis not present

## 2015-04-06 DIAGNOSIS — F17210 Nicotine dependence, cigarettes, uncomplicated: Secondary | ICD-10-CM | POA: Diagnosis not present

## 2015-04-06 DIAGNOSIS — M6281 Muscle weakness (generalized): Secondary | ICD-10-CM | POA: Diagnosis not present

## 2015-04-06 DIAGNOSIS — R2689 Other abnormalities of gait and mobility: Secondary | ICD-10-CM | POA: Diagnosis not present

## 2015-04-06 DIAGNOSIS — L03116 Cellulitis of left lower limb: Secondary | ICD-10-CM | POA: Diagnosis not present

## 2015-04-07 DIAGNOSIS — G5621 Lesion of ulnar nerve, right upper limb: Secondary | ICD-10-CM | POA: Diagnosis not present

## 2015-04-07 DIAGNOSIS — J45909 Unspecified asthma, uncomplicated: Secondary | ICD-10-CM | POA: Diagnosis not present

## 2015-04-07 DIAGNOSIS — F4310 Post-traumatic stress disorder, unspecified: Secondary | ICD-10-CM | POA: Diagnosis not present

## 2015-04-07 DIAGNOSIS — F319 Bipolar disorder, unspecified: Secondary | ICD-10-CM | POA: Diagnosis not present

## 2015-04-07 DIAGNOSIS — M6281 Muscle weakness (generalized): Secondary | ICD-10-CM | POA: Diagnosis not present

## 2015-04-07 DIAGNOSIS — Z96652 Presence of left artificial knee joint: Secondary | ICD-10-CM | POA: Diagnosis not present

## 2015-04-07 DIAGNOSIS — J449 Chronic obstructive pulmonary disease, unspecified: Secondary | ICD-10-CM | POA: Diagnosis not present

## 2015-04-07 DIAGNOSIS — F17210 Nicotine dependence, cigarettes, uncomplicated: Secondary | ICD-10-CM | POA: Diagnosis not present

## 2015-04-07 DIAGNOSIS — R2689 Other abnormalities of gait and mobility: Secondary | ICD-10-CM | POA: Diagnosis not present

## 2015-04-08 DIAGNOSIS — J449 Chronic obstructive pulmonary disease, unspecified: Secondary | ICD-10-CM | POA: Diagnosis not present

## 2015-04-08 NOTE — Progress Notes (Signed)
   03/26/15 1205  SLP G-Codes **NOT FOR INPATIENT CLASS**  Functional Assessment Tool Used clinical judgement  Functional Limitations Spoken language comprehension  Spoken Language Comprehension Current Status [phone]) CJ  Spoken Language Comprehension Goal Status (G9160) CI  SLP Evaluations  $ SLP Speech Visit 1 Procedure  SLP Evaluations  $ SLP EVAL LANGUAGE/SOUND PRODUCTION 1 Procedure   Late entry G-codes entered by Tera Helper, PT, DPT, NCS after review of documentation by Leafy Kindle and discussion with primary speech therapist Orinda Kenner, [place].  Karey Stucki H. Owens Shark, PT, DPT, NCS 04/08/2015, 8:12 AM [phone]

## 2015-04-11 ENCOUNTER — Ambulatory Visit: Payer: Self-pay | Admitting: Family Medicine

## 2015-04-12 DIAGNOSIS — J449 Chronic obstructive pulmonary disease, unspecified: Secondary | ICD-10-CM | POA: Diagnosis not present

## 2015-04-12 DIAGNOSIS — Z96652 Presence of left artificial knee joint: Secondary | ICD-10-CM | POA: Diagnosis not present

## 2015-04-12 DIAGNOSIS — G5621 Lesion of ulnar nerve, right upper limb: Secondary | ICD-10-CM | POA: Diagnosis not present

## 2015-04-12 DIAGNOSIS — M6281 Muscle weakness (generalized): Secondary | ICD-10-CM | POA: Diagnosis not present

## 2015-04-12 DIAGNOSIS — F17210 Nicotine dependence, cigarettes, uncomplicated: Secondary | ICD-10-CM | POA: Diagnosis not present

## 2015-04-12 DIAGNOSIS — F4310 Post-traumatic stress disorder, unspecified: Secondary | ICD-10-CM | POA: Diagnosis not present

## 2015-04-12 DIAGNOSIS — F319 Bipolar disorder, unspecified: Secondary | ICD-10-CM | POA: Diagnosis not present

## 2015-04-12 DIAGNOSIS — R2689 Other abnormalities of gait and mobility: Secondary | ICD-10-CM | POA: Diagnosis not present

## 2015-04-12 DIAGNOSIS — J45909 Unspecified asthma, uncomplicated: Secondary | ICD-10-CM | POA: Diagnosis not present

## 2015-04-14 DIAGNOSIS — R2689 Other abnormalities of gait and mobility: Secondary | ICD-10-CM | POA: Diagnosis not present

## 2015-04-14 DIAGNOSIS — F319 Bipolar disorder, unspecified: Secondary | ICD-10-CM | POA: Diagnosis not present

## 2015-04-14 DIAGNOSIS — F17210 Nicotine dependence, cigarettes, uncomplicated: Secondary | ICD-10-CM | POA: Diagnosis not present

## 2015-04-14 DIAGNOSIS — M6281 Muscle weakness (generalized): Secondary | ICD-10-CM | POA: Diagnosis not present

## 2015-04-14 DIAGNOSIS — Z96652 Presence of left artificial knee joint: Secondary | ICD-10-CM | POA: Diagnosis not present

## 2015-04-14 DIAGNOSIS — J45909 Unspecified asthma, uncomplicated: Secondary | ICD-10-CM | POA: Diagnosis not present

## 2015-04-14 DIAGNOSIS — J449 Chronic obstructive pulmonary disease, unspecified: Secondary | ICD-10-CM | POA: Diagnosis not present

## 2015-04-14 DIAGNOSIS — F4310 Post-traumatic stress disorder, unspecified: Secondary | ICD-10-CM | POA: Diagnosis not present

## 2015-04-14 DIAGNOSIS — G5621 Lesion of ulnar nerve, right upper limb: Secondary | ICD-10-CM | POA: Diagnosis not present

## 2015-04-15 ENCOUNTER — Other Ambulatory Visit: Payer: Self-pay

## 2015-04-15 ENCOUNTER — Ambulatory Visit: Payer: Commercial Managed Care - HMO | Admitting: Psychiatry

## 2015-04-15 NOTE — Telephone Encounter (Signed)
Pt needs enough medication to come to appt . Pt has appt on Thursday the 23rd.

## 2015-04-20 ENCOUNTER — Ambulatory Visit: Payer: Self-pay | Admitting: Family Medicine

## 2015-04-21 ENCOUNTER — Encounter: Payer: Self-pay | Admitting: Psychiatry

## 2015-04-21 ENCOUNTER — Ambulatory Visit: Payer: Commercial Managed Care - HMO | Admitting: Psychiatry

## 2015-04-21 DIAGNOSIS — F319 Bipolar disorder, unspecified: Secondary | ICD-10-CM | POA: Insufficient documentation

## 2015-04-21 DIAGNOSIS — L03119 Cellulitis of unspecified part of limb: Secondary | ICD-10-CM | POA: Diagnosis not present

## 2015-04-21 MED ORDER — ALPRAZOLAM 1 MG PO TABS: 1.0000 mg | ORAL_TABLET | Freq: Two times a day (BID) | ORAL | Status: DC | PRN

## 2015-04-21 MED ORDER — DIVALPROEX SODIUM 250 MG PO DR TAB: 250.0000 mg | DELAYED_RELEASE_TABLET | Freq: Two times a day (BID) | ORAL | Status: DC

## 2015-04-21 MED ORDER — ESCITALOPRAM OXALATE 20 MG PO TABS: 20.0000 mg | ORAL_TABLET | ORAL | Status: DC
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
## 2015-04-22 ENCOUNTER — Emergency Department: Payer: Commercial Managed Care - HMO

## 2015-04-22 ENCOUNTER — Emergency Department
Admission: EM | Admit: 2015-04-22 | Discharge: 2015-04-22 | Disposition: A | Discharge status: Home / Self Care | Payer: Commercial Managed Care - HMO | Attending: Emergency Medicine | Admitting: Emergency Medicine

## 2015-04-22 ENCOUNTER — Ambulatory Visit: Payer: Self-pay | Admitting: Family Medicine

## 2015-04-22 ENCOUNTER — Encounter: Payer: Self-pay | Admitting: *Deleted

## 2015-04-22 DIAGNOSIS — Z7982 Long term (current) use of aspirin: Secondary | ICD-10-CM | POA: Diagnosis not present

## 2015-04-22 DIAGNOSIS — Z79899 Other long term (current) drug therapy: Secondary | ICD-10-CM | POA: Diagnosis not present

## 2015-04-22 DIAGNOSIS — F17210 Nicotine dependence, cigarettes, uncomplicated: Secondary | ICD-10-CM | POA: Diagnosis not present

## 2015-04-22 DIAGNOSIS — L97429 Non-pressure chronic ulcer of left heel and midfoot with unspecified severity: Secondary | ICD-10-CM | POA: Diagnosis not present

## 2015-04-22 DIAGNOSIS — L98499 Non-pressure chronic ulcer of skin of other sites with unspecified severity: Secondary | ICD-10-CM

## 2015-04-22 DIAGNOSIS — L089 Local infection of the skin and subcutaneous tissue, unspecified: Secondary | ICD-10-CM | POA: Diagnosis present

## 2015-04-22 DIAGNOSIS — Z791 Long term (current) use of non-steroidal anti-inflammatories (NSAID): Secondary | ICD-10-CM | POA: Insufficient documentation

## 2015-04-22 LAB — CBC
HCT: 39 % (ref 35.0–47.0)
HEMOGLOBIN: 12.9 g/dL (ref 12.0–16.0)
MCH: 30.1 pg (ref 26.0–34.0)
MCHC: 33.2 g/dL (ref 32.0–36.0)
MCV: 90.8 fL (ref 80.0–100.0)
Platelets: 167 10*3/uL (ref 150–440)
RBC: 4.29 MIL/uL (ref 3.80–5.20)
RDW: 17.3 % — AB (ref 11.5–14.5)
WBC: 5.3 10*3/uL (ref 3.6–11.0)

## 2015-04-22 LAB — BASIC METABOLIC PANEL
ANION GAP: 8 (ref 5–15)
BUN: 10 mg/dL (ref 6–20)
CHLORIDE: 95 mmol/L — AB (ref 101–111)
CO2: 32 mmol/L (ref 22–32)
CREATININE: 1.31 mg/dL — AB (ref 0.44–1.00)
Calcium: 9.1 mg/dL (ref 8.9–10.3)
GFR calc non Af Amer: 45 mL/min — ABNORMAL LOW (ref >60)
GFR, EST AFRICAN AMERICAN: 52 mL/min — AB (ref >60)
GLUCOSE: 100 mg/dL — AB (ref 65–99)
Potassium: 3.6 mmol/L (ref 3.5–5.1)
Sodium: 135 mmol/L (ref 135–145)

## 2015-04-22 MED ORDER — SODIUM CHLORIDE 0.9 % IV BOLUS (SEPSIS)
500.0000 mL | Freq: Once | INTRAVENOUS | Status: AC
  Administered 2015-04-22: 500 mL via INTRAVENOUS

## 2015-04-22 MED ORDER — CLINDAMYCIN PHOSPHATE 600 MG/50ML IV SOLN
600.0000 mg | Freq: Once | INTRAVENOUS | Status: AC
  Administered 2015-04-22: 600 mg via INTRAVENOUS
  Filled 2015-04-22: qty 50

## 2015-04-22 MED ORDER — CLINDAMYCIN HCL 300 MG PO CAPS: 300.0000 mg | ORAL_CAPSULE | Freq: Three times a day (TID) | ORAL | Status: DC

## 2015-04-22 MED ORDER — CLINDAMYCIN PHOSPHATE 600 MG/50ML IV SOLN
INTRAVENOUS | Status: AC
  Administered 2015-04-22: 600 mg via INTRAVENOUS
  Filled 2015-04-22: qty 50

## 2015-04-22 NOTE — ED Notes (Signed)
Pt states she was seen at urgent care yesterday for an infection in her left heel and told to go to the ER, states she has had an ulcer there for a while, arrives with left heel wrapped, yellow drainage and black wound area

## 2015-04-22 NOTE — ED Notes (Signed)
Patient presents to the ED with infection to left heel.  Patient states she stepped on a pebble about 3 months ago and had a "sore" on her foot.  Patient states she first got area looked at about 3 weeks ago at Urgent care returned to Urgent care yesterday and was told to come into the hospital.  Patient is having difficulty walking on left foot.  Patient denies any history of diabetes.  Foot is wrapped at this time.

## 2015-04-22 NOTE — Discharge Instructions (Signed)
Please seek medical attention for any high fevers, chest pain, shortness of breath, change in behavior, persistent vomiting, bloody stool or any other new or concerning symptoms. ° ° °Cellulitis °Cellulitis is an infection of the skin and the tissue beneath it. The infected area is usually red and tender. Cellulitis occurs most often in the arms and lower legs.  °CAUSES  °Cellulitis is caused by bacteria that enter the skin through cracks or cuts in the skin. The most common types of bacteria that cause cellulitis are staphylococci and streptococci. °SIGNS AND SYMPTOMS  °· Redness and warmth. °· Swelling. °· Tenderness or pain. °· Fever. °DIAGNOSIS  °Your health care provider can usually determine what is wrong based on a physical exam. Blood tests may also be done. °TREATMENT  °Treatment usually involves taking an antibiotic medicine. °HOME CARE INSTRUCTIONS  °· Take your antibiotic medicine as directed by your health care provider. Finish the antibiotic even if you start to feel better. °· Keep the infected arm or leg elevated to reduce swelling. °· Apply a warm cloth to the affected area up to 4 times per day to relieve pain. °· Take medicines only as directed by your health care provider. °· Keep all follow-up visits as directed by your health care provider. °SEEK MEDICAL CARE IF:  °· You notice red streaks coming from the infected area. °· Your red area gets larger or turns dark in color. °· Your bone or joint underneath the infected area becomes painful after the skin has healed. °· Your infection returns in the same area or another area. °· You notice a swollen bump in the infected area. °· You develop new symptoms. °· You have a fever. °SEEK IMMEDIATE MEDICAL CARE IF:  °· You feel very sleepy. °· You develop vomiting or diarrhea. °· You have a general ill feeling (malaise) with muscle aches and pains. °  °This information is not intended to replace advice given to you by your health care provider. Make sure  you discuss any questions you have with your health care provider. °  °Document Released: 11/22/2004 Document Revised: 11/03/2014 Document Reviewed: 04/30/2011 °Elsevier Interactive Patient Education ©2016 [employer] Inc[employer] ° °

## 2015-04-22 NOTE — ED Notes (Signed)
Patient given firm instructions regarding taking anti-biotics and following up with wound care, podiatry, and pcp.  Patient verbalized understanding using teach back method.  Patient also reviewed signs and symptoms of worsening infection and instructed to come back to ED if she notices red streaking, fever, or has vomiting.  Patient verbalized understanding.

## 2015-04-22 NOTE — ED Provider Notes (Signed)
[facility] Emergency Department Provider Note   ____________________________________________  Time seen: ~1510  I have reviewed the triage vital signs and the nursing notes.   HISTORY  Chief Complaint Skin Ulcer   History limited by: Not Limited   HPI Nicole Austin is a 55 y.o. female who presents to the emergency department today because of concerns for left heel infection. The patient states that this started about 3 weeks ago. She states it is progressively gotten worse. She does have pain at that left heel. The pain is constant the pain is severe. It is worse with pressure. She states that the pain does come up her leg a little. There is some associated redness and swelling. The patient states she had a low-grade fever at her psychiatrist's point yesterday. She denies any nausea or vomiting. She denies being put on any antibiotics for this. Patient states that she has not seen her primary care for this.      Past Medical History  Diagnosis Date  . COPD (chronic obstructive pulmonary disease) ([place])   . PTSD (post-traumatic stress disorder)   . Rhabdomyolysis   . Bipolar 1 disorder ([place])   . Arthritis   . Stroke ([place])   . MI (myocardial infarction) ([place])   . Depression   . ADHD (attention deficit hyperactivity disorder)   . Asthma     Patient Active Problem List   Diagnosis Date Noted  . Bipolar affective disorder ([place]) 04/21/2015  . Stroke ([place]) 03/25/2015  . Bipolar I disorder ([place]) 03/03/2015  . Bipolar I disorder, most recent episode depressed ([place]) 12/22/2014  . Morbid obesity ([place]) 12/22/2014  . Anxiety 08/16/2014  . Affective bipolar disorder ([place]) 08/16/2014  . Gout 08/16/2014  . Extreme obesity ([place]) 08/16/2014  . Muscle spasms of both lower extremities 08/16/2014  . Back pain, chronic 08/13/2014  . Low back pain with sciatica 08/04/2014  . Compulsive tobacco user syndrome 08/04/2014  . Current tobacco use 08/04/2014  .  Polysubstance abuse 07/04/2014  . Anxiety, generalized 11/24/2013  . Imbalance 11/24/2013  . Blurred vision 11/24/2013  . Cephalalgia 11/24/2013  . COPD, moderate ([place]) 11/18/2013  . Moderate COPD (chronic obstructive pulmonary disease) ([place]) 11/18/2013  . HPV (human papilloma virus) infection 07/17/2013  . Arthritis of knee, degenerative 07/15/2013  . H/O neoplasm 07/31/2011    Past Surgical History  Procedure Laterality Date  . Replacement total knee Left     Current Outpatient Rx  Name  Route  Sig  Dispense  Refill  . acetaminophen (TYLENOL) 325 MG tablet   Oral   Take 2 tablets (650 mg total) by mouth every 6 (six) hours as needed for mild pain (or Fever >/= 101).         Marland Kitchen albuterol (PROVENTIL HFA;VENTOLIN HFA) 108 (90 Base) MCG/ACT inhaler   Inhalation   Inhale 1-2 puffs into the lungs every 6 (six) hours as needed for wheezing or shortness of breath.   6.7 g   1   . ALPRAZolam (XANAX) 1 MG tablet   Oral   Take 1 tablet (1 mg total) by mouth 2 (two) times daily as needed for anxiety.   45 tablet   2   . aspirin EC 81 MG tablet   Oral   Take 1 tablet (81 mg total) by mouth daily.         Marland Kitchen atorvastatin (LIPITOR) 40 MG tablet   Oral   Take 1 tablet (40 mg total) by mouth daily at  6 PM.   30 tablet   0   . cyclobenzaprine (FLEXERIL) 5 MG tablet   Oral   Take 2 tablets (10 mg total) by mouth 3 (three) times daily as needed for muscle spasms.   90 tablet   5   . divalproex (DEPAKOTE) 250 MG DR tablet   Oral   Take 1 tablet (250 mg total) by mouth 2 (two) times daily.   60 tablet   1   . escitalopram (LEXAPRO) 20 MG tablet   Oral   Take 1 tablet (20 mg total) by mouth every other day.   30 tablet   2   . glucosamine-chondroitin 500-400 MG tablet   Oral   Take 1 tablet by mouth 2 (two) times daily.         Marland Kitchen ipratropium-albuterol (DUONEB) 0.5-2.5 (3) MG/3ML SOLN   Nebulization   Take 3 mLs by nebulization every 4 (four) hours as  needed. Patient taking differently: Take 3 mLs by nebulization every 4 (four) hours as needed (for wheezing).    360 mL   6   . lidocaine (XYLOCAINE) 5 % ointment   Topical   Apply 1 application topically as needed for mild pain.         Marland Kitchen LYRICA 200 MG capsule   Oral   Take 1 capsule (200 mg total) by mouth 2 (two) times daily.   60 capsule   5     Dispense as written.   . Multiple Vitamin (MULTIVITAMIN WITH MINERALS) TABS tablet   Oral   Take 1 tablet by mouth daily.         . naproxen (NAPROSYN) 500 MG tablet   Oral   Take 1 tablet (500 mg total) by mouth 2 (two) times daily with a meal.   30 tablet   0   . QUEtiapine (SEROQUEL XR) 300 MG 24 hr tablet   Oral   Take 300 mg by mouth at bedtime.         . traMADol (ULTRAM) 50 MG tablet   Oral   Take 1 tablet (50 mg total) by mouth 2 (two) times daily. Patient taking differently: Take 50 mg by mouth 2 (two) times daily as needed for moderate pain.    60 tablet   0     Call in to [facility], refill 03/15/16, [phone]     Allergies Codeine  Family History  Problem Relation Age of Onset  . Hernia Mother   . Heart disease Mother   . OCD Mother   . Diabetes Mother   . Parkinson's disease Father   . Bipolar disorder Sister   . Schizophrenia Sister   . ADD / ADHD Sister   . Alcohol abuse Brother   . Bipolar disorder Sister   . Paranoid behavior Sister   . ADD / ADHD Sister     Social History Social History  Substance Use Topics  . Smoking status: Current Every Day Smoker -- 0.50 packs/day    Types: Cigarettes    Start date: 10/11/1984  . Smokeless tobacco: Never Used  . Alcohol Use: No    Review of Systems  Constitutional: Positive for low-grade fever Cardiovascular: Negative for chest pain. Respiratory: Negative for shortness of breath. Gastrointestinal: Negative for abdominal pain, vomiting and diarrhea. Musculoskeletal: Negative for back pain. Skin: left heel ulcer Neurological: Negative  for headaches, focal weakness or numbness.   10-point ROS otherwise negative.  ____________________________________________   PHYSICAL EXAM:  VITAL SIGNS: ED Triage  Vitals  Enc Vitals Group     BP 04/22/15 1201 121/63 mmHg     Pulse Rate 04/22/15 1201 113     Resp 04/22/15 1201 18     Temp 04/22/15 1201 98.4 F (36.9 C)     Temp Source 04/22/15 1201 Oral     SpO2 04/22/15 1201 95 %     Weight 04/22/15 1201 254 lb (115.214 kg)     Height 04/22/15 1201 5\' 1"  (1.549 m)     Head Cir --      Peak Flow --      Pain Score 04/22/15 1204 10   Constitutional: Alert and oriented. Well appearing and in no distress. Eyes: Conjunctivae are normal. PERRL. Normal extraocular movements. ENT   Head: Normocephalic and atraumatic.   Nose: No congestion/rhinnorhea.   Mouth/Throat: Mucous membranes are moist.   Neck: No stridor. Hematological/Lymphatic/Immunilogical: No cervical lymphadenopathy. Cardiovascular: Normal rate, regular rhythm.  No murmurs, rubs, or gallops. Respiratory: Normal respiratory effort without tachypnea nor retractions. Breath sounds are clear and equal bilaterally. No wheezes/rales/rhonchi. Gastrointestinal: Soft and nontender. No distention.  Genitourinary: Deferred Musculoskeletal: Normal range of motion in all extremities. No joint effusions.  No lower extremity tenderness nor edema. Neurologic:  Normal speech and language. No gross focal neurologic deficits are appreciated.  Skin:  Ulcer to left heel, some erythema and swelling surrounding the area Psychiatric: Mood and affect are normal. Speech and behavior are normal. Patient exhibits appropriate insight and judgment.  ____________________________________________    LABS (pertinent positives/negatives)  Labs Reviewed  CBC - Abnormal; Notable for the following:    RDW 17.3 (*)    All other components within normal limits  BASIC METABOLIC PANEL - Abnormal; Notable for the following:    Chloride  95 (*)    Glucose, Bld 100 (*)    Creatinine, Ser 1.31 (*)    GFR calc non Af Amer 45 (*)    GFR calc Af Amer 52 (*)    All other components within normal limits     ____________________________________________   EKG  None  ____________________________________________    RADIOLOGY  Left foot IMPRESSION: Soft tissue abnormality of the heel. No evidence of bone involvement.  ____________________________________________   PROCEDURES  Procedure(s) performed: None  Critical Care performed: No  ____________________________________________   INITIAL IMPRESSION / ASSESSMENT AND PLAN / ED COURSE  Pertinent labs & imaging results that were available during my care of the patient were reviewed by me and considered in my medical decision making (see chart for details).  She presented to the emergency department today because of concerns for left heel ulcer. X-ray did not show any osseous involvement. Patient has not been on antibiotics for this. Will give dose of IV antibiotics here and discharged home with antibiotics. Additionally will give patient podiatry and wound care follow-up.  ____________________________________________   FINAL CLINICAL IMPRESSION(S) / ED DIAGNOSES  Final diagnoses:  Skin ulcer, with unspecified severity ([place])     Nance Pear, MD 04/22/15 [id number]

## 2015-04-27 ENCOUNTER — Encounter: Payer: Self-pay | Admitting: Family Medicine

## 2015-04-27 ENCOUNTER — Ambulatory Visit (INDEPENDENT_AMBULATORY_CARE_PROVIDER_SITE_OTHER): Payer: Medicare HMO | Admitting: Family Medicine

## 2015-04-27 VITALS — BP 128/82 | HR 113 | Temp 98.4°F | Resp 16 | Wt 261.0 lb

## 2015-04-27 DIAGNOSIS — M62838 Other muscle spasm: Secondary | ICD-10-CM

## 2015-04-27 DIAGNOSIS — L97529 Non-pressure chronic ulcer of other part of left foot with unspecified severity: Secondary | ICD-10-CM | POA: Insufficient documentation

## 2015-04-27 DIAGNOSIS — G894 Chronic pain syndrome: Secondary | ICD-10-CM

## 2015-04-27 DIAGNOSIS — L97522 Non-pressure chronic ulcer of other part of left foot with fat layer exposed: Secondary | ICD-10-CM

## 2015-04-27 DIAGNOSIS — L97909 Non-pressure chronic ulcer of unspecified part of unspecified lower leg with unspecified severity: Secondary | ICD-10-CM | POA: Insufficient documentation

## 2015-04-27 DIAGNOSIS — E785 Hyperlipidemia, unspecified: Secondary | ICD-10-CM | POA: Diagnosis not present

## 2015-04-27 MED ORDER — TRAMADOL HCL 50 MG PO TABS: 50.0000 mg | ORAL_TABLET | Freq: Two times a day (BID) | ORAL | Status: DC | PRN

## 2015-04-27 MED ORDER — LYRICA 200 MG PO CAPS: 200.0000 mg | ORAL_CAPSULE | Freq: Two times a day (BID) | ORAL | Status: DC

## 2015-04-27 MED ORDER — CYCLOBENZAPRINE HCL 5 MG PO TABS: 5.0000 mg | ORAL_TABLET | Freq: Three times a day (TID) | ORAL | Status: DC | PRN

## 2015-04-27 MED ORDER — ATORVASTATIN CALCIUM 40 MG PO TABS: 40.0000 mg | ORAL_TABLET | Freq: Every day | ORAL | Status: DC

## 2015-04-27 NOTE — Progress Notes (Signed)
Name: Nicole Austin   MRN: [id number]    DOB: 1960-11-01   Date:04/27/2015       Progress Note  Subjective  Chief Complaint  Chief Complaint  Patient presents with  . Medication Refill  . Hyperlipidemia  . Foot Pain    needs referral to podiatrist for foot infection on left heel    HPI  Nicole Austin is a 55 y.o. female who presents after being seen in the ER on 04/22/15 for a left heel ulcer. The patient states that this started about 4 weeks ago. She states it is progressively gotten worse. She does have pain at that left heel. The pain is constant the pain is severe. It is worse with pressure. She states that the pain does come up her leg a little. There is some associated redness and swelling.   In the ER x-rays showed no bone involvement. She was given IV antibiotics and was to have podiatry and wound care f/u but no referral is in place that I can see and patient does not know of any appointment either.  She was seeing pain management some months ago and the provider has advised against chronic use of pain medications esp opioids due to Nicole Austin propensity for dependency and overuse of her benzodiazepines.  She has known to be drowsy and "falls asleep" throughout" the day on her alprazolam often waking up and forgetting she took the medication and taking more benzo. Her sister is with her today and verifies this and that is why Nicole Austin is staying with her for now. Her sister has been giving Nicole Austin her medications and dressing her heal.  Lipid labs done 03/26/15 non fasting. Tolerating statin therapy well. Mood disorder fluctuates. Follows with psychiatrist regularly.   Patient is requesting tramadol, lyrica refill.   Past Medical History  Diagnosis Date  . COPD (chronic obstructive pulmonary disease) ([place])   . PTSD (post-traumatic stress disorder)   . Rhabdomyolysis   . Bipolar 1 disorder ([place])   . Arthritis   . Stroke ([place])   . MI (myocardial infarction) ([place])    . Depression   . ADHD (attention deficit hyperactivity disorder)   . Asthma     Patient Active Problem List   Diagnosis Date Noted  . Bipolar affective disorder ([place]) 04/21/2015  . Stroke ([place]) 03/25/2015  . Bipolar I disorder ([place]) 03/03/2015  . Bipolar I disorder, most recent episode depressed ([place]) 12/22/2014  . Morbid obesity ([place]) 12/22/2014  . Anxiety 08/16/2014  . Affective bipolar disorder ([place]) 08/16/2014  . Gout 08/16/2014  . Extreme obesity ([place]) 08/16/2014  . Muscle spasms of both lower extremities 08/16/2014  . Back pain, chronic 08/13/2014  . Low back pain with sciatica 08/04/2014  . Compulsive tobacco user syndrome 08/04/2014  . Current tobacco use 08/04/2014  . Polysubstance abuse 07/04/2014  . Anxiety, generalized 11/24/2013  . Imbalance 11/24/2013  . Blurred vision 11/24/2013  . Cephalalgia 11/24/2013  . COPD, moderate ([place]) 11/18/2013  . Moderate COPD (chronic obstructive pulmonary disease) ([place]) 11/18/2013  . HPV (human papilloma virus) infection 07/17/2013  . Arthritis of knee, degenerative 07/15/2013  . H/O neoplasm 07/31/2011    Social History  Substance Use Topics  . Smoking status: Current Every Day Smoker -- 0.50 packs/day    Types: Cigarettes    Start date: 10/11/1984  . Smokeless tobacco: Never Used  . Alcohol Use: No     Current outpatient prescriptions:  .  acetaminophen (TYLENOL) 325 MG tablet, Take 2  tablets (650 mg total) by mouth every 6 (six) hours as needed for mild pain (or Fever >/= 101)., Disp: , Rfl:  .  albuterol (PROVENTIL HFA;VENTOLIN HFA) 108 (90 Base) MCG/ACT inhaler, Inhale 1-2 puffs into the lungs every 6 (six) hours as needed for wheezing or shortness of breath., Disp: 6.7 g, Rfl: 1 .  ALPRAZolam (XANAX) 1 MG tablet, Take 1 tablet (1 mg total) by mouth 2 (two) times daily as needed for anxiety., Disp: 45 tablet, Rfl: 2 .  aspirin EC 81 MG tablet, Take 1 tablet (81 mg total) by mouth daily., Disp: , Rfl:  .  atorvastatin  (LIPITOR) 40 MG tablet, Take 1 tablet (40 mg total) by mouth daily at 6 PM., Disp: 30 tablet, Rfl: 0 .  clindamycin (CLEOCIN) 300 MG capsule, Take 1 capsule (300 mg total) by mouth 3 (three) times daily., Disp: 30 capsule, Rfl: 0 .  cyclobenzaprine (FLEXERIL) 5 MG tablet, Take 2 tablets (10 mg total) by mouth 3 (three) times daily as needed for muscle spasms., Disp: 90 tablet, Rfl: 5 .  divalproex (DEPAKOTE) 250 MG DR tablet, Take 1 tablet (250 mg total) by mouth 2 (two) times daily., Disp: 60 tablet, Rfl: 1 .  escitalopram (LEXAPRO) 20 MG tablet, Take 1 tablet (20 mg total) by mouth every other day., Disp: 30 tablet, Rfl: 2 .  glucosamine-chondroitin 500-400 MG tablet, Take 1 tablet by mouth 2 (two) times daily., Disp: , Rfl:  .  ipratropium-albuterol (DUONEB) 0.5-2.5 (3) MG/3ML SOLN, Take 3 mLs by nebulization every 4 (four) hours as needed. (Patient taking differently: Take 3 mLs by nebulization every 4 (four) hours as needed (for wheezing). ), Disp: 360 mL, Rfl: 6 .  lidocaine (XYLOCAINE) 5 % ointment, Apply 1 application topically as needed for mild pain., Disp: , Rfl:  .  LYRICA 200 MG capsule, Take 1 capsule (200 mg total) by mouth 2 (two) times daily., Disp: 60 capsule, Rfl: 5 .  Multiple Vitamin (MULTIVITAMIN WITH MINERALS) TABS tablet, Take 1 tablet by mouth daily., Disp: , Rfl:  .  naproxen (NAPROSYN) 500 MG tablet, Take 1 tablet (500 mg total) by mouth 2 (two) times daily with a meal., Disp: 30 tablet, Rfl: 0 .  QUEtiapine (SEROQUEL XR) 300 MG 24 hr tablet, Take 300 mg by mouth at bedtime., Disp: , Rfl:  .  traMADol (ULTRAM) 50 MG tablet, Take 1 tablet (50 mg total) by mouth 2 (two) times daily. (Patient taking differently: Take 50 mg by mouth 2 (two) times daily as needed for moderate pain. ), Disp: 60 tablet, Rfl: 0  Past Surgical History  Procedure Laterality Date  . Replacement total knee Left     Family History  Problem Relation Age of Onset  . Hernia Mother   . Heart disease  Mother   . OCD Mother   . Diabetes Mother   . Parkinson's disease Father   . Bipolar disorder Sister   . Schizophrenia Sister   . ADD / ADHD Sister   . Alcohol abuse Brother   . Bipolar disorder Sister   . Paranoid behavior Sister   . ADD / ADHD Sister     Allergies  Allergen Reactions  . Codeine Itching     Review of Systems  CONSTITUTIONAL: No significant weight changes, fever, chills, weakness or fatigue.  SKIN: Yes left foot skin break down. CARDIOVASCULAR: No chest pain, chest pressure or chest discomfort. No palpitations or edema.  RESPIRATORY: No shortness of breath, cough or sputum.  NEUROLOGICAL: No headache, dizziness, syncope, paralysis, ataxia, numbness or tingling in the extremities. No memory changes. No change in bowel or bladder control.  MUSCULOSKELETAL: Yes joint pain. No muscle pain. HEMATOLOGIC: No anemia, bleeding or bruising.  LYMPHATICS: No enlarged lymph nodes.  PSYCHIATRIC: No change in mood. No change in sleep pattern.  ENDOCRINOLOGIC: No reports of sweating, cold or heat intolerance. No polyuria or polydipsia.     Objective  BP 128/82 mmHg  Pulse 113  Temp(Src) 98.4 F (36.9 C) (Oral)  Resp 16  Wt 261 lb (118.389 kg)  SpO2 90%  LMP  (LMP Unknown) Body mass index is 49.34 kg/(m^2).  Physical Exam  Constitutional: Patient is obese and well-nourished. In no distress.  Cardiovascular: Normal rate, regular rhythm and normal heart sounds.  No murmur heard.  Pulmonary/Chest: Effort normal and breath sounds normal. No respiratory distress. Musculoskeletal: Normal range of motion bilateral UE and LE, no joint effusions. Peripheral vascular: Bilateral LE trace pedal edema.  Neurological: CN II-XII grossly intact with no focal deficits. Alert and oriented to person, place, and time. Coordination, balance, strength, speech is normal. Gait is waddling and limping. Walks in and out of clinic with no support. Skin: LEFT FOOT medial aspect heal with a  3 cm ulcer with fat layer exposure. Necrotic debrie present. No pus or erythema in surrounding skin. Psychiatric: Patient has a stable mood and affect. Behavior is normal in office today. Judgment and thought content normal in office today.   Recent Results (from the past 2160 hour(s))  Comprehensive metabolic panel     Status: Abnormal   Collection Time: 03/01/15 11:27 PM  Result Value Ref Range   Sodium 136 135 - 145 mmol/L   Potassium 5.0 3.5 - 5.1 mmol/L    Comment: HEMOLYSIS AT THIS LEVEL MAY AFFECT RESULT   Chloride 99 (L) 101 - 111 mmol/L   CO2 26 22 - 32 mmol/L   Glucose, Bld 130 (H) 65 - 99 mg/dL   BUN 12 6 - 20 mg/dL   Creatinine, Ser 0.87 0.44 - 1.00 mg/dL   Calcium 8.6 (L) 8.9 - 10.3 mg/dL   Total Protein 6.9 6.5 - 8.1 g/dL   Albumin 3.3 (L) 3.5 - 5.0 g/dL   AST 413 (H) 15 - 41 U/L    Comment: HEMOLYSIS AT THIS LEVEL MAY AFFECT RESULT   ALT 90 (H) 14 - 54 U/L    Comment: HEMOLYSIS AT THIS LEVEL MAY AFFECT RESULT   Alkaline Phosphatase 75 38 - 126 U/L   Total Bilirubin 1.8 (H) 0.3 - 1.2 mg/dL    Comment: HEMOLYSIS AT THIS LEVEL MAY AFFECT RESULT   GFR calc non Af Amer >60 >60 mL/min   GFR calc Af Amer >60 >60 mL/min    Comment: (NOTE) The eGFR has been calculated using the CKD EPI equation. This calculation has not been validated in all clinical situations. eGFR's persistently <60 mL/min signify possible Chronic Kidney Disease.    Anion gap 11 5 - 15  Troponin I     Status: None   Collection Time: 03/01/15 11:27 PM  Result Value Ref Range   Troponin I <0.03 <0.031 ng/mL    Comment:        NO INDICATION OF MYOCARDIAL INJURY.   Lactic acid, plasma     Status: Abnormal   Collection Time: 03/01/15 11:27 PM  Result Value Ref Range   Lactic Acid, Venous 2.2 (HH) 0.5 - 2.0 mmol/L    Comment: CRITICAL RESULT CALLED TO,  READ BACK BY AND VERIFIED WITH [facility] AT 0107 03/02/15 MLZ   CBC     Status: Abnormal   Collection Time: 03/01/15 11:27 PM  Result Value Ref  Range   WBC 10.3 3.6 - 11.0 K/uL   RBC 4.39 3.80 - 5.20 MIL/uL   Hemoglobin 13.2 12.0 - 16.0 g/dL   HCT 39.6 35.0 - 47.0 %   MCV 90.1 80.0 - 100.0 fL   MCH 30.0 26.0 - 34.0 pg   MCHC 33.3 32.0 - 36.0 g/dL   RDW 14.7 (H) 11.5 - 14.5 %   Platelets 162 150 - 440 K/uL  Creatinine, serum     Status: None   Collection Time: 03/01/15 11:27 PM  Result Value Ref Range   Creatinine, Ser 0.81 0.44 - 1.00 mg/dL   GFR calc non Af Amer >60 >60 mL/min   GFR calc Af Amer >60 >60 mL/min    Comment: (NOTE) The eGFR has been calculated using the CKD EPI equation. This calculation has not been validated in all clinical situations. eGFR's persistently <60 mL/min signify possible Chronic Kidney Disease.   Brain natriuretic peptide     Status: Abnormal   Collection Time: 03/02/15  1:28 AM  Result Value Ref Range   B Natriuretic Peptide 159.0 (H) 0.0 - 100.0 pg/mL  Lactic acid, plasma     Status: None   Collection Time: 03/02/15  5:48 AM  Result Value Ref Range   Lactic Acid, Venous 2.0 0.5 - 2.0 mmol/L  Basic metabolic panel     Status: Abnormal   Collection Time: 03/02/15  5:48 AM  Result Value Ref Range   Sodium 136 135 - 145 mmol/L   Potassium 4.0 3.5 - 5.1 mmol/L   Chloride 100 (L) 101 - 111 mmol/L   CO2 30 22 - 32 mmol/L   Glucose, Bld 133 (H) 65 - 99 mg/dL   BUN 11 6 - 20 mg/dL   Creatinine, Ser 0.83 0.44 - 1.00 mg/dL   Calcium 8.2 (L) 8.9 - 10.3 mg/dL   GFR calc non Af Amer >60 >60 mL/min   GFR calc Af Amer >60 >60 mL/min    Comment: (NOTE) The eGFR has been calculated using the CKD EPI equation. This calculation has not been validated in all clinical situations. eGFR's persistently <60 mL/min signify possible Chronic Kidney Disease.    Anion gap 6 5 - 15  CBC     Status: Abnormal   Collection Time: 03/02/15  5:48 AM  Result Value Ref Range   WBC 10.5 3.6 - 11.0 K/uL   RBC 4.10 3.80 - 5.20 MIL/uL   Hemoglobin 12.4 12.0 - 16.0 g/dL   HCT 36.7 35.0 - 47.0 %   MCV 89.7 80.0 -  100.0 fL   MCH 30.2 26.0 - 34.0 pg   MCHC 33.7 32.0 - 36.0 g/dL   RDW 15.0 (H) 11.5 - 14.5 %   Platelets 172 150 - 440 K/uL  Blood gas, arterial     Status: Abnormal   Collection Time: 03/02/15 12:32 PM  Result Value Ref Range   FIO2 34.00    Delivery systems NASAL CANNULA    pH, Arterial 7.45 7.350 - 7.450   pCO2 arterial 49 (H) 32.0 - 48.0 mmHg   pO2, Arterial 61 (L) 83.0 - 108.0 mmHg   Bicarbonate 34.1 (H) 21.0 - 28.0 mEq/L   Acid-Base Excess 8.7 (H) 0.0 - 3.0 mmol/L   O2 Saturation 92.1 %   Patient temperature 37.0  Collection site RIGHT RADIAL    Sample type ARTERIAL DRAW    Allens test (pass/fail) POSITIVE (A) PASS  CBC     Status: Abnormal   Collection Time: 03/03/15  5:11 AM  Result Value Ref Range   WBC 9.9 3.6 - 11.0 K/uL   RBC 4.29 3.80 - 5.20 MIL/uL   Hemoglobin 12.9 12.0 - 16.0 g/dL   HCT 38.8 35.0 - 47.0 %   MCV 90.5 80.0 - 100.0 fL   MCH 30.0 26.0 - 34.0 pg   MCHC 33.2 32.0 - 36.0 g/dL   RDW 15.0 (H) 11.5 - 14.5 %   Platelets 186 150 - 440 K/uL  Basic metabolic panel     Status: Abnormal   Collection Time: 03/03/15  5:11 AM  Result Value Ref Range   Sodium 139 135 - 145 mmol/L   Potassium 4.2 3.5 - 5.1 mmol/L   Chloride 103 101 - 111 mmol/L   CO2 28 22 - 32 mmol/L   Glucose, Bld 150 (H) 65 - 99 mg/dL   BUN 12 6 - 20 mg/dL   Creatinine, Ser 0.76 0.44 - 1.00 mg/dL   Calcium 8.3 (L) 8.9 - 10.3 mg/dL   GFR calc non Af Amer >60 >60 mL/min   GFR calc Af Amer >60 >60 mL/min    Comment: (NOTE) The eGFR has been calculated using the CKD EPI equation. This calculation has not been validated in all clinical situations. eGFR's persistently <60 mL/min signify possible Chronic Kidney Disease.    Anion gap 8 5 - 15  Hemoglobin A1c     Status: None   Collection Time: 03/06/15  7:26 AM  Result Value Ref Range   Hgb A1c MFr Bld 5.7 4.0 - 6.0 %  CBC     Status: Abnormal   Collection Time: 03/06/15  7:26 AM  Result Value Ref Range   WBC 9.3 3.6 - 11.0 K/uL    RBC 4.27 3.80 - 5.20 MIL/uL   Hemoglobin 12.8 12.0 - 16.0 g/dL   HCT 39.5 35.0 - 47.0 %   MCV 92.6 80.0 - 100.0 fL   MCH 30.1 26.0 - 34.0 pg   MCHC 32.5 32.0 - 36.0 g/dL   RDW 15.2 (H) 11.5 - 14.5 %   Platelets 252 150 - 440 K/uL  Creatinine, serum     Status: None   Collection Time: 03/06/15  7:26 AM  Result Value Ref Range   Creatinine, Ser 0.92 0.44 - 1.00 mg/dL   GFR calc non Af Amer >60 >60 mL/min   GFR calc Af Amer >60 >60 mL/min    Comment: (NOTE) The eGFR has been calculated using the CKD EPI equation. This calculation has not been validated in all clinical situations. eGFR's persistently <60 mL/min signify possible Chronic Kidney Disease.   Hepatic function panel     Status: Abnormal   Collection Time: 03/07/15 11:21 AM  Result Value Ref Range   Total Protein 6.1 (L) 6.5 - 8.1 g/dL   Albumin 3.0 (L) 3.5 - 5.0 g/dL   AST 34 15 - 41 U/L   ALT 45 14 - 54 U/L   Alkaline Phosphatase 71 38 - 126 U/L   Total Bilirubin 0.7 0.3 - 1.2 mg/dL   Bilirubin, Direct 0.1 0.1 - 0.5 mg/dL   Indirect Bilirubin 0.6 0.3 - 0.9 mg/dL  Protime-INR     Status: None   Collection Time: 03/25/15  6:37 PM  Result Value Ref Range   Prothrombin Time 13.4 11.4 -  15.0 seconds   INR 1.00   APTT     Status: None   Collection Time: 03/25/15  6:37 PM  Result Value Ref Range   aPTT 27 24 - 36 seconds  CBC     Status: Abnormal   Collection Time: 03/25/15  6:37 PM  Result Value Ref Range   WBC 9.2 3.6 - 11.0 K/uL   RBC 4.56 3.80 - 5.20 MIL/uL   Hemoglobin 13.7 12.0 - 16.0 g/dL   HCT 42.3 35.0 - 47.0 %   MCV 92.8 80.0 - 100.0 fL   MCH 30.1 26.0 - 34.0 pg   MCHC 32.5 32.0 - 36.0 g/dL   RDW 16.6 (H) 11.5 - 14.5 %   Platelets 153 150 - 440 K/uL  Differential     Status: Abnormal   Collection Time: 03/25/15  6:37 PM  Result Value Ref Range   Neutrophils Relative % 69 %   Neutro Abs 6.3 1.4 - 6.5 K/uL   Lymphocytes Relative 18 %   Lymphs Abs 1.7 1.0 - 3.6 K/uL   Monocytes Relative 11 %    Monocytes Absolute 1.0 (H) 0.2 - 0.9 K/uL   Eosinophils Relative 1 %   Eosinophils Absolute 0.1 0 - 0.7 K/uL   Basophils Relative 1 %   Basophils Absolute 0.1 0 - 0.1 K/uL  Comprehensive metabolic panel     Status: Abnormal   Collection Time: 03/25/15  6:37 PM  Result Value Ref Range   Sodium 141 135 - 145 mmol/L   Potassium 4.6 3.5 - 5.1 mmol/L   Chloride 102 101 - 111 mmol/L   CO2 30 22 - 32 mmol/L   Glucose, Bld 100 (H) 65 - 99 mg/dL   BUN 15 6 - 20 mg/dL   Creatinine, Ser 1.44 (H) 0.44 - 1.00 mg/dL   Calcium 9.1 8.9 - 10.3 mg/dL   Total Protein 7.8 6.5 - 8.1 g/dL   Albumin 3.5 3.5 - 5.0 g/dL   AST 161 (H) 15 - 41 U/L   ALT 230 (H) 14 - 54 U/L   Alkaline Phosphatase 72 38 - 126 U/L   Total Bilirubin 0.9 0.3 - 1.2 mg/dL   GFR calc non Af Amer 40 (L) >60 mL/min   GFR calc Af Amer 46 (L) >60 mL/min    Comment: (NOTE) The eGFR has been calculated using the CKD EPI equation. This calculation has not been validated in all clinical situations. eGFR's persistently <60 mL/min signify possible Chronic Kidney Disease.    Anion gap 9 5 - 15  Troponin I     Status: Abnormal   Collection Time: 03/25/15  6:37 PM  Result Value Ref Range   Troponin I 0.08 (H) <0.031 ng/mL    Comment: READ BACK AND VERIFIED WITH CHRISTINE KIM ON 03/25/15 AT 2010PM BY TLB        PERSISTENTLY INCREASED TROPONIN VALUES IN THE RANGE OF 0.04-0.49 ng/mL CAN BE SEEN IN:       -UNSTABLE ANGINA       -CONGESTIVE HEART FAILURE       -MYOCARDITIS       -CHEST TRAUMA       -ARRYHTHMIAS       -LATE PRESENTING MYOCARDIAL INFARCTION       -COPD   CLINICAL FOLLOW-UP RECOMMENDED.   Valproic acid level     Status: Abnormal   Collection Time: 03/25/15  6:37 PM  Result Value Ref Range   Valproic Acid Lvl 37 (L) 50.0 - 100.0  ug/mL  Glucose, capillary     Status: None   Collection Time: 03/25/15  6:40 PM  Result Value Ref Range   Glucose-Capillary 83 65 - 99 mg/dL  Hemoglobin A1c     Status: None   Collection  Time: 03/26/15  5:32 AM  Result Value Ref Range   Hgb A1c MFr Bld 5.6 4.0 - 6.0 %  Lipid panel     Status: Abnormal   Collection Time: 03/26/15  5:32 AM  Result Value Ref Range   Cholesterol 160 0 - 200 mg/dL   Triglycerides 152 (H) <150 mg/dL   HDL 28 (L) >40 mg/dL   Total CHOL/HDL Ratio 5.7 RATIO   VLDL 30 0 - 40 mg/dL   LDL Cholesterol 102 (H) 0 - 99 mg/dL    Comment:        Total Cholesterol/HDL:CHD Risk Coronary Heart Disease Risk Table                     Men   Women  1/2 Average Risk   3.4   3.3  Average Risk       5.0   4.4  2 X Average Risk   9.6   7.1  3 X Average Risk  23.4   11.0        Use the calculated Patient Ratio above and the CHD Risk Table to determine the patient's CHD Risk.        ATP III CLASSIFICATION (LDL):  <100     mg/dL   Optimal  100-129  mg/dL   Near or Above                    Optimal  130-159  mg/dL   Borderline  160-189  mg/dL   High  >190     mg/dL   Very High   Troponin I (q 6hr x 3)     Status: Abnormal   Collection Time: 03/26/15  4:22 PM  Result Value Ref Range   Troponin I 0.04 (H) <0.031 ng/mL    Comment: PREVIOUS RESULT CALLED TO [place] ON 03/25/15 AT 2010 BY TLB/QSD        PERSISTENTLY INCREASED TROPONIN VALUES IN THE RANGE OF 0.04-0.49 ng/mL CAN BE SEEN IN:       -UNSTABLE ANGINA       -CONGESTIVE HEART FAILURE       -MYOCARDITIS       -CHEST TRAUMA       -ARRYHTHMIAS       -LATE PRESENTING MYOCARDIAL INFARCTION       -COPD   CLINICAL FOLLOW-UP RECOMMENDED.   Troponin I (q 6hr x 3)     Status: Abnormal   Collection Time: 03/26/15 10:13 PM  Result Value Ref Range   Troponin I 0.05 (H) <0.031 ng/mL    Comment: READ BACK AND VERIFIED WITH [facility] AT 2252 03/26/15.PMH        PERSISTENTLY INCREASED TROPONIN VALUES IN THE RANGE OF 0.04-0.49 ng/mL CAN BE SEEN IN:       -UNSTABLE ANGINA       -CONGESTIVE HEART FAILURE       -MYOCARDITIS       -CHEST TRAUMA       -ARRYHTHMIAS       -LATE PRESENTING  MYOCARDIAL INFARCTION       -COPD   CLINICAL FOLLOW-UP RECOMMENDED.   Basic metabolic panel     Status: Abnormal   Collection Time: 03/28/15  4:34 AM  Result Value Ref Range   Sodium 140 135 - 145 mmol/L   Potassium 3.3 (L) 3.5 - 5.1 mmol/L   Chloride 107 101 - 111 mmol/L   CO2 27 22 - 32 mmol/L   Glucose, Bld 99 65 - 99 mg/dL   BUN 13 6 - 20 mg/dL   Creatinine, Ser 1.19 (H) 0.44 - 1.00 mg/dL   Calcium 8.3 (L) 8.9 - 10.3 mg/dL   GFR calc non Af Amer 50 (L) >60 mL/min   GFR calc Af Amer 58 (L) >60 mL/min    Comment: (NOTE) The eGFR has been calculated using the CKD EPI equation. This calculation has not been validated in all clinical situations. eGFR's persistently <60 mL/min signify possible Chronic Kidney Disease.    Anion gap 6 5 - 15  CBC     Status: Abnormal   Collection Time: 04/22/15 12:07 PM  Result Value Ref Range   WBC 5.3 3.6 - 11.0 K/uL   RBC 4.29 3.80 - 5.20 MIL/uL   Hemoglobin 12.9 12.0 - 16.0 g/dL   HCT 39.0 35.0 - 47.0 %   MCV 90.8 80.0 - 100.0 fL   MCH 30.1 26.0 - 34.0 pg   MCHC 33.2 32.0 - 36.0 g/dL   RDW 17.3 (H) 11.5 - 14.5 %   Platelets 167 150 - 440 K/uL  Basic metabolic panel     Status: Abnormal   Collection Time: 04/22/15 12:07 PM  Result Value Ref Range   Sodium 135 135 - 145 mmol/L   Potassium 3.6 3.5 - 5.1 mmol/L   Chloride 95 (L) 101 - 111 mmol/L   CO2 32 22 - 32 mmol/L   Glucose, Bld 100 (H) 65 - 99 mg/dL   BUN 10 6 - 20 mg/dL   Creatinine, Ser 1.31 (H) 0.44 - 1.00 mg/dL   Calcium 9.1 8.9 - 10.3 mg/dL   GFR calc non Af Amer 45 (L) >60 mL/min   GFR calc Af Amer 52 (L) >60 mL/min    Comment: (NOTE) The eGFR has been calculated using the CKD EPI equation. This calculation has not been validated in all clinical situations. eGFR's persistently <60 mL/min signify possible Chronic Kidney Disease.    Anion gap 8 5 - 15     Assessment & Plan  1. Foot ulcer, left, with fat layer exposed ([place]) Patient acquired foot ulcer due to sedentary  life with frequent drowsiness from medications causing her to fall asleep throughout the day. Certainly PAD could be an issue as she is a heavy cigarette smoker but will need further testing with ABI near future if needed.  Wound cleaned with NS and alcohol swab, triple antibiotic placed and dressing replaced. Will get her to podiatry asap.   - traMADol (ULTRAM) 50 MG tablet; Take 1 tablet (50 mg total) by mouth every 12 (twelve) hours as needed.  Dispense: 60 tablet; Refill: 0 - Ambulatory referral to Podiatry  2. Hyperlipidemia LDL goal <100 Refilled  - atorvastatin (LIPITOR) 40 MG tablet; Take 1 tablet (40 mg total) by mouth at bedtime.  Dispense: 30 tablet; Refill: 5  3. Chronic pain disorder Patient does not have pain contract with me as she should be going to chronic pain management specialist. I have filled tramadol for acute heel injury as she is monitored closely by her sister now and patient should not be on mentation altering medications chronically.   - LYRICA 200 MG capsule; Take 1 capsule (200 mg total) by mouth 2 (  two) times daily.  Dispense: 60 capsule; Refill: 5 - traMADol (ULTRAM) 50 MG tablet; Take 1 tablet (50 mg total) by mouth every 12 (twelve) hours as needed.  Dispense: 60 tablet; Refill: 0  4. Muscle spasms of both lower extremities Refilled. I am not sure she is following with pain management regularly.  - cyclobenzaprine (FLEXERIL) 5 MG tablet; Take 1 tablet (5 mg total) by mouth 3 (three) times daily as needed for muscle spasms.  Dispense: 90 tablet; Refill: 2

## 2015-05-02 DIAGNOSIS — L97422 Non-pressure chronic ulcer of left heel and midfoot with fat layer exposed: Secondary | ICD-10-CM | POA: Diagnosis not present

## 2015-05-04 ENCOUNTER — Ambulatory Visit: Payer: Self-pay | Admitting: Family Medicine

## 2015-05-04 DIAGNOSIS — M1991 Primary osteoarthritis, unspecified site: Secondary | ICD-10-CM | POA: Diagnosis not present

## 2015-05-04 DIAGNOSIS — G8929 Other chronic pain: Secondary | ICD-10-CM | POA: Diagnosis not present

## 2015-05-04 DIAGNOSIS — F319 Bipolar disorder, unspecified: Secondary | ICD-10-CM | POA: Diagnosis not present

## 2015-05-04 DIAGNOSIS — L97422 Non-pressure chronic ulcer of left heel and midfoot with fat layer exposed: Secondary | ICD-10-CM | POA: Diagnosis not present

## 2015-05-04 DIAGNOSIS — G629 Polyneuropathy, unspecified: Secondary | ICD-10-CM | POA: Diagnosis not present

## 2015-05-04 DIAGNOSIS — L97421 Non-pressure chronic ulcer of left heel and midfoot limited to breakdown of skin: Secondary | ICD-10-CM | POA: Diagnosis not present

## 2015-05-04 DIAGNOSIS — M1990 Unspecified osteoarthritis, unspecified site: Secondary | ICD-10-CM | POA: Diagnosis not present

## 2015-05-04 DIAGNOSIS — F988 Other specified behavioral and emotional disorders with onset usually occurring in childhood and adolescence: Secondary | ICD-10-CM | POA: Diagnosis not present

## 2015-05-04 DIAGNOSIS — J449 Chronic obstructive pulmonary disease, unspecified: Secondary | ICD-10-CM | POA: Diagnosis not present

## 2015-05-06 DIAGNOSIS — J449 Chronic obstructive pulmonary disease, unspecified: Secondary | ICD-10-CM | POA: Diagnosis not present

## 2015-05-09 DIAGNOSIS — L97422 Non-pressure chronic ulcer of left heel and midfoot with fat layer exposed: Secondary | ICD-10-CM | POA: Diagnosis not present

## 2015-05-10 DIAGNOSIS — F988 Other specified behavioral and emotional disorders with onset usually occurring in childhood and adolescence: Secondary | ICD-10-CM | POA: Diagnosis not present

## 2015-05-10 DIAGNOSIS — L97422 Non-pressure chronic ulcer of left heel and midfoot with fat layer exposed: Secondary | ICD-10-CM | POA: Diagnosis not present

## 2015-05-10 DIAGNOSIS — M1990 Unspecified osteoarthritis, unspecified site: Secondary | ICD-10-CM | POA: Diagnosis not present

## 2015-05-10 DIAGNOSIS — G8929 Other chronic pain: Secondary | ICD-10-CM | POA: Diagnosis not present

## 2015-05-10 DIAGNOSIS — J449 Chronic obstructive pulmonary disease, unspecified: Secondary | ICD-10-CM | POA: Diagnosis not present

## 2015-05-10 DIAGNOSIS — F319 Bipolar disorder, unspecified: Secondary | ICD-10-CM | POA: Diagnosis not present

## 2015-05-12 ENCOUNTER — Encounter: Payer: Self-pay | Admitting: Podiatry

## 2015-05-12 ENCOUNTER — Encounter: Payer: Self-pay | Admitting: Emergency Medicine

## 2015-05-12 ENCOUNTER — Emergency Department: Payer: Commercial Managed Care - HMO

## 2015-05-12 DIAGNOSIS — Z79891 Long term (current) use of opiate analgesic: Secondary | ICD-10-CM | POA: Insufficient documentation

## 2015-05-12 DIAGNOSIS — Z791 Long term (current) use of non-steroidal anti-inflammatories (NSAID): Secondary | ICD-10-CM | POA: Insufficient documentation

## 2015-05-12 DIAGNOSIS — Z820 Family history of epilepsy and other diseases of the nervous system: Secondary | ICD-10-CM | POA: Insufficient documentation

## 2015-05-12 DIAGNOSIS — J449 Chronic obstructive pulmonary disease, unspecified: Secondary | ICD-10-CM | POA: Insufficient documentation

## 2015-05-12 DIAGNOSIS — L89629 Pressure ulcer of left heel, unspecified stage: Secondary | ICD-10-CM | POA: Diagnosis not present

## 2015-05-12 DIAGNOSIS — M1990 Unspecified osteoarthritis, unspecified site: Secondary | ICD-10-CM | POA: Diagnosis not present

## 2015-05-12 DIAGNOSIS — Z833 Family history of diabetes mellitus: Secondary | ICD-10-CM | POA: Insufficient documentation

## 2015-05-12 DIAGNOSIS — F411 Generalized anxiety disorder: Secondary | ICD-10-CM | POA: Insufficient documentation

## 2015-05-12 DIAGNOSIS — L03116 Cellulitis of left lower limb: Secondary | ICD-10-CM | POA: Insufficient documentation

## 2015-05-12 DIAGNOSIS — I252 Old myocardial infarction: Secondary | ICD-10-CM | POA: Diagnosis not present

## 2015-05-12 DIAGNOSIS — M79605 Pain in left leg: Secondary | ICD-10-CM | POA: Diagnosis not present

## 2015-05-12 DIAGNOSIS — G629 Polyneuropathy, unspecified: Secondary | ICD-10-CM | POA: Insufficient documentation

## 2015-05-12 DIAGNOSIS — Z8249 Family history of ischemic heart disease and other diseases of the circulatory system: Secondary | ICD-10-CM | POA: Insufficient documentation

## 2015-05-12 DIAGNOSIS — Z7982 Long term (current) use of aspirin: Secondary | ICD-10-CM | POA: Diagnosis not present

## 2015-05-12 DIAGNOSIS — Z818 Family history of other mental and behavioral disorders: Secondary | ICD-10-CM | POA: Diagnosis not present

## 2015-05-12 DIAGNOSIS — Z79899 Other long term (current) drug therapy: Secondary | ICD-10-CM | POA: Diagnosis not present

## 2015-05-12 DIAGNOSIS — F319 Bipolar disorder, unspecified: Secondary | ICD-10-CM | POA: Insufficient documentation

## 2015-05-12 DIAGNOSIS — Z8673 Personal history of transient ischemic attack (TIA), and cerebral infarction without residual deficits: Secondary | ICD-10-CM | POA: Insufficient documentation

## 2015-05-12 DIAGNOSIS — F17210 Nicotine dependence, cigarettes, uncomplicated: Secondary | ICD-10-CM | POA: Insufficient documentation

## 2015-05-12 DIAGNOSIS — M549 Dorsalgia, unspecified: Secondary | ICD-10-CM | POA: Diagnosis not present

## 2015-05-12 DIAGNOSIS — E785 Hyperlipidemia, unspecified: Secondary | ICD-10-CM | POA: Diagnosis not present

## 2015-05-12 DIAGNOSIS — J45909 Unspecified asthma, uncomplicated: Secondary | ICD-10-CM | POA: Diagnosis not present

## 2015-05-12 DIAGNOSIS — M7989 Other specified soft tissue disorders: Secondary | ICD-10-CM | POA: Diagnosis not present

## 2015-05-12 DIAGNOSIS — F4310 Post-traumatic stress disorder, unspecified: Secondary | ICD-10-CM | POA: Diagnosis not present

## 2015-05-12 DIAGNOSIS — Z885 Allergy status to narcotic agent status: Secondary | ICD-10-CM | POA: Insufficient documentation

## 2015-05-12 DIAGNOSIS — F909 Attention-deficit hyperactivity disorder, unspecified type: Secondary | ICD-10-CM | POA: Diagnosis not present

## 2015-05-12 DIAGNOSIS — G8929 Other chronic pain: Secondary | ICD-10-CM | POA: Diagnosis not present

## 2015-05-12 LAB — CBC WITH DIFFERENTIAL/PLATELET
Basophils Absolute: 0 10*3/uL (ref 0–0.1)
Basophils Relative: 1 %
EOS ABS: 1.1 10*3/uL — AB (ref 0–0.7)
EOS PCT: 21 %
HCT: 38.5 % (ref 35.0–47.0)
HEMOGLOBIN: 12.7 g/dL (ref 12.0–16.0)
LYMPHS ABS: 1.8 10*3/uL (ref 1.0–3.6)
Lymphocytes Relative: 35 %
MCH: 29.5 pg (ref 26.0–34.0)
MCHC: 33.1 g/dL (ref 32.0–36.0)
MCV: 89.1 fL (ref 80.0–100.0)
MONO ABS: 0.7 10*3/uL (ref 0.2–0.9)
Monocytes Relative: 13 %
NEUTROS ABS: 1.6 10*3/uL (ref 1.4–6.5)
NEUTROS PCT: 30 %
Platelets: 164 10*3/uL (ref 150–440)
RBC: 4.32 MIL/uL (ref 3.80–5.20)
RDW: 16.9 % — AB (ref 11.5–14.5)
WBC: 5.3 10*3/uL (ref 3.6–11.0)

## 2015-05-12 LAB — BASIC METABOLIC PANEL
ANION GAP: 5 (ref 5–15)
BUN: 14 mg/dL (ref 6–20)
CALCIUM: 9.2 mg/dL (ref 8.9–10.3)
CO2: 33 mmol/L — AB (ref 22–32)
Chloride: 99 mmol/L — ABNORMAL LOW (ref 101–111)
Creatinine, Ser: 1.12 mg/dL — ABNORMAL HIGH (ref 0.44–1.00)
GFR calc non Af Amer: 54 mL/min — ABNORMAL LOW (ref >60)
Glucose, Bld: 94 mg/dL (ref 65–99)
POTASSIUM: 3.4 mmol/L — AB (ref 3.5–5.1)
Sodium: 137 mmol/L (ref 135–145)

## 2015-05-12 MED ORDER — IBUPROFEN 600 MG PO TABS: 600.0000 mg | ORAL_TABLET | Freq: Once | ORAL | Status: DC

## 2015-05-12 MED ORDER — IBUPROFEN 100 MG/5ML PO SUSP
ORAL | Status: AC
  Filled 2015-05-12: qty 30

## 2015-05-12 NOTE — ED Notes (Signed)
Patient has had a wound on left heel for over 1 month.  Under the care of physician and has home health care for dressing change.  Patient states that nurse from [facility] was in to see patient today at home and was concerned that patient had developed cellulitis.  Patient c/o left lower leg swelling and pain.

## 2015-05-13 ENCOUNTER — Telehealth: Payer: Self-pay | Admitting: Family Medicine

## 2015-05-13 ENCOUNTER — Encounter: Payer: Self-pay | Admitting: Internal Medicine

## 2015-05-13 ENCOUNTER — Observation Stay
Admission: EM | Admit: 2015-05-13 | Discharge: 2015-05-14 | Disposition: A | Discharge status: Home / Self Care | Payer: Commercial Managed Care - HMO | Attending: Internal Medicine | Admitting: Internal Medicine

## 2015-05-13 DIAGNOSIS — F419 Anxiety disorder, unspecified: Secondary | ICD-10-CM | POA: Diagnosis present

## 2015-05-13 DIAGNOSIS — F319 Bipolar disorder, unspecified: Secondary | ICD-10-CM | POA: Diagnosis not present

## 2015-05-13 DIAGNOSIS — J449 Chronic obstructive pulmonary disease, unspecified: Secondary | ICD-10-CM | POA: Diagnosis present

## 2015-05-13 DIAGNOSIS — E786 Lipoprotein deficiency: Secondary | ICD-10-CM | POA: Insufficient documentation

## 2015-05-13 DIAGNOSIS — L89629 Pressure ulcer of left heel, unspecified stage: Secondary | ICD-10-CM | POA: Diagnosis not present

## 2015-05-13 DIAGNOSIS — L03116 Cellulitis of left lower limb: Secondary | ICD-10-CM | POA: Diagnosis not present

## 2015-05-13 DIAGNOSIS — E785 Hyperlipidemia, unspecified: Secondary | ICD-10-CM | POA: Diagnosis present

## 2015-05-13 DIAGNOSIS — F3130 Bipolar disorder, current episode depressed, mild or moderate severity, unspecified: Secondary | ICD-10-CM | POA: Diagnosis present

## 2015-05-13 DIAGNOSIS — L0390 Cellulitis, unspecified: Secondary | ICD-10-CM | POA: Diagnosis present

## 2015-05-13 LAB — CBC
HCT: 35.8 % (ref 35.0–47.0)
Hemoglobin: 11.9 g/dL — ABNORMAL LOW (ref 12.0–16.0)
MCH: 29.7 pg (ref 26.0–34.0)
MCHC: 33.1 g/dL (ref 32.0–36.0)
MCV: 89.7 fL (ref 80.0–100.0)
PLATELETS: 156 10*3/uL (ref 150–440)
RBC: 3.99 MIL/uL (ref 3.80–5.20)
RDW: 17 % — ABNORMAL HIGH (ref 11.5–14.5)
WBC: 5.5 10*3/uL (ref 3.6–11.0)

## 2015-05-13 LAB — CREATININE, SERUM
CREATININE: 1.03 mg/dL — AB (ref 0.44–1.00)
GFR calc non Af Amer: >60 mL/min (ref >60)

## 2015-05-13 LAB — MRSA PCR SCREENING: MRSA BY PCR: NEGATIVE

## 2015-05-13 MED ORDER — PIPERACILLIN-TAZOBACTAM 3.375 G IVPB
3.3750 g | Freq: Three times a day (TID) | INTRAVENOUS | Status: DC
  Administered 2015-05-13 – 2015-05-14 (×3): 3.375 g via INTRAVENOUS
  Filled 2015-05-13 (×5): qty 50

## 2015-05-13 MED ORDER — ATORVASTATIN CALCIUM 20 MG PO TABS
40.0000 mg | ORAL_TABLET | Freq: Every day | ORAL | Status: DC
  Administered 2015-05-13: 40 mg via ORAL
  Filled 2015-05-13: qty 2

## 2015-05-13 MED ORDER — ACETAMINOPHEN 650 MG RE SUPP: 650.0000 mg | Freq: Four times a day (QID) | RECTAL | Status: DC | PRN

## 2015-05-13 MED ORDER — IPRATROPIUM-ALBUTEROL 0.5-2.5 (3) MG/3ML IN SOLN: 3.0000 mL | RESPIRATORY_TRACT | Status: DC | PRN

## 2015-05-13 MED ORDER — MORPHINE SULFATE (PF) 4 MG/ML IV SOLN
4.0000 mg | Freq: Once | INTRAVENOUS | Status: AC
  Administered 2015-05-13: 4 mg via INTRAVENOUS
  Filled 2015-05-13: qty 1

## 2015-05-13 MED ORDER — VANCOMYCIN HCL IN DEXTROSE 1-5 GM/200ML-% IV SOLN
1000.0000 mg | Freq: Once | INTRAVENOUS | Status: AC
  Administered 2015-05-13: 1000 mg via INTRAVENOUS
  Filled 2015-05-13: qty 200

## 2015-05-13 MED ORDER — PREGABALIN 75 MG PO CAPS
200.0000 mg | ORAL_CAPSULE | Freq: Two times a day (BID) | ORAL | Status: DC
  Administered 2015-05-13 – 2015-05-14 (×3): 200 mg via ORAL
  Filled 2015-05-13 (×3): qty 1

## 2015-05-13 MED ORDER — ESCITALOPRAM OXALATE 10 MG PO TABS
20.0000 mg | ORAL_TABLET | ORAL | Status: DC
  Administered 2015-05-13: 20 mg via ORAL
  Filled 2015-05-13: qty 2

## 2015-05-13 MED ORDER — DIVALPROEX SODIUM 250 MG PO DR TAB
250.0000 mg | DELAYED_RELEASE_TABLET | Freq: Two times a day (BID) | ORAL | Status: DC
  Administered 2015-05-13 – 2015-05-14 (×2): 250 mg via ORAL
  Filled 2015-05-13 (×4): qty 1

## 2015-05-13 MED ORDER — TRAMADOL HCL 50 MG PO TABS
50.0000 mg | ORAL_TABLET | Freq: Four times a day (QID) | ORAL | Status: DC | PRN
Start: 2015-05-13 — End: 2015-05-14
  Administered 2015-05-13 – 2015-05-14 (×3): 50 mg via ORAL
  Filled 2015-05-13 (×3): qty 1

## 2015-05-13 MED ORDER — ALPRAZOLAM 0.5 MG PO TABS
1.0000 mg | ORAL_TABLET | Freq: Two times a day (BID) | ORAL | Status: DC | PRN
  Administered 2015-05-13: 1 mg via ORAL
  Filled 2015-05-13: qty 2

## 2015-05-13 MED ORDER — ENOXAPARIN SODIUM 40 MG/0.4ML ~~LOC~~ SOLN
40.0000 mg | Freq: Two times a day (BID) | SUBCUTANEOUS | Status: DC
  Administered 2015-05-13 – 2015-05-14 (×3): 40 mg via SUBCUTANEOUS
  Filled 2015-05-13 (×3): qty 0.4

## 2015-05-13 MED ORDER — SODIUM CHLORIDE 0.9 % IV SOLN
3.0000 g | Freq: Once | INTRAVENOUS | Status: DC
  Filled 2015-05-13 (×2): qty 3

## 2015-05-13 MED ORDER — VANCOMYCIN HCL IN DEXTROSE 750-5 MG/150ML-% IV SOLN
750.0000 mg | Freq: Two times a day (BID) | INTRAVENOUS | Status: DC
  Administered 2015-05-13 – 2015-05-14 (×3): 750 mg via INTRAVENOUS
  Filled 2015-05-13 (×4): qty 150

## 2015-05-13 MED ORDER — ACETAMINOPHEN 325 MG PO TABS: 650.0000 mg | ORAL_TABLET | Freq: Four times a day (QID) | ORAL | Status: DC | PRN

## 2015-05-13 MED ORDER — ASPIRIN EC 81 MG PO TBEC
81.0000 mg | DELAYED_RELEASE_TABLET | Freq: Every day | ORAL | Status: DC
  Administered 2015-05-13 – 2015-05-14 (×2): 81 mg via ORAL
  Filled 2015-05-13 (×2): qty 1

## 2015-05-13 MED ORDER — VANCOMYCIN HCL 10 G IV SOLR: 1.0000 g | Freq: Once | INTRAVENOUS | Status: DC

## 2015-05-13 MED ORDER — ONDANSETRON HCL 4 MG PO TABS: 4.0000 mg | ORAL_TABLET | Freq: Four times a day (QID) | ORAL | Status: DC | PRN

## 2015-05-13 MED ORDER — QUETIAPINE FUMARATE ER 300 MG PO TB24
300.0000 mg | ORAL_TABLET | Freq: Every day | ORAL | Status: DC
  Administered 2015-05-13: 300 mg via ORAL
  Filled 2015-05-13 (×2): qty 1

## 2015-05-13 MED ORDER — ONDANSETRON HCL 4 MG/2ML IJ SOLN
4.0000 mg | Freq: Once | INTRAMUSCULAR | Status: AC
  Administered 2015-05-13: 4 mg via INTRAVENOUS
  Filled 2015-05-13: qty 2

## 2015-05-13 MED ORDER — ONDANSETRON HCL 4 MG/2ML IJ SOLN: 4.0000 mg | Freq: Four times a day (QID) | INTRAMUSCULAR | Status: DC | PRN

## 2015-05-13 MED ORDER — COLLAGENASE 250 UNIT/GM EX OINT
TOPICAL_OINTMENT | Freq: Every day | CUTANEOUS | Status: DC
Start: 2015-05-13 — End: 2015-05-14
  Administered 2015-05-13 – 2015-05-14 (×2): via TOPICAL
  Filled 2015-05-13: qty 30

## 2015-05-13 NOTE — Progress Notes (Signed)
Pharmacy Antibiotic Note  Nicole Austin is a 55 y.o. female admitted on 05/13/2015 with cellulitis.  Pharmacy has been consulted for vancomycin & piperacillin/tazobactam dosing.  Plan: Vancomycin 750 IV every 12 hours.  Goal trough 10-15 mcg/mL. Zosyn 3.375g IV q8h (4 hour infusion).   Kinetics: adjusted BW 75 kg CrCl 67 mL/min Ke: 0.060 Half-life: 11.5 hrs Cmin (calculated) ~11 mcg/mL Stacked dose to begin 8 hours after initial dose  Height: 5\' 1"  (154.9 cm) Weight: 255 lb (115.667 kg) IBW/kg (Calculated) : 47.8  Temp (24hrs), Avg:97.7 F (36.5 C), Min:97.7 F (36.5 C), Max:97.7 F (36.5 C)   Recent Labs Lab 05/12/15 2253  WBC 5.3  CREATININE 1.12*    Estimated Creatinine Clearance: 67.2 mL/min (by C-G formula based on Cr of 1.12).    Allergies  Allergen Reactions  . Codeine Itching    Thank you for allowing pharmacy to be a part of this patient's care.  Darylene Price [facility] 05/13/2015 4:39 AM

## 2015-05-13 NOTE — Care Management Note (Addendum)
Case Management Note  Patient Details  Name: Nicole Austin MRN: [id number] Date of Birth: 1961-02-17  Subjective/Objective:                  Met with patient to discuss concerns [employer] shared. Patient states that she lives with a friend named Santiago Glad. Santiago Glad pays half bills and able to drive. Patient denies difficulty getting to [facility] doctor appointments or getting her medications. She states she uses [employer] for transportation at times. She states she ambulates with a cane. Santiago Glad has been helping with meals and housekeeping needs per patient.  Action/Plan: RNCM will continue to follow.   Expected Discharge Date:                  Expected Discharge Plan:     In-House Referral:     Discharge planning Services  CM Consult  Post Acute Care Choice:  Home Health Choice offered to:  Patient  DME Arranged:    DME Agency:     HH Arranged:    [place] Agency:  [place]  Status of Service:  In process, will continue to follow  Medicare Important Message Given:    Date Medicare IM Given:    Medicare IM give by:    Date Additional Medicare IM Given:    Additional Medicare Important Message give by:     If discussed at [place] of Stay Meetings, dates discussed:    Additional Comments: [facility] closed patient out in Feb. 2017. Patient was only open to HHPT not nursing. They state she did not have heel wounds.  She states that phone number is accurate. She does not want to see Dr. Elvina Mattes anymore which is why she states she missed appointments. She will not accept another home health agency and states "she wants to keep the [place]" but denies [place] nurse doing wound care. RNCM to continue to follow.    Marshell Garfinkel, RN 05/13/2015, 12:45 PM

## 2015-05-13 NOTE — Progress Notes (Signed)
[facility] [place] NAME: Nicole Austin    MR#:  [id number]  DATE OF BIRTH:  17-Mar-1960  SUBJECTIVE:   Pt. Here due to left lower extremity pain and swelling and redness and noted to have a cellulitis. Still complaining of significant pain in her left leg. Afebrile.   REVIEW OF SYSTEMS:    Review of Systems  Constitutional: Negative for fever and chills.  HENT: Negative for congestion and tinnitus.   Eyes: Negative for blurred vision and double vision.  Respiratory: Negative for cough, shortness of breath and wheezing.   Cardiovascular: Negative for chest pain, orthopnea and PND.  Gastrointestinal: Negative for nausea, vomiting, abdominal pain and diarrhea.  Genitourinary: Negative for dysuria and hematuria.  Skin: Positive for rash (left lower extremity cellulitis).  Neurological: Negative for dizziness, sensory change and focal weakness.  All other systems reviewed and are negative.   Nutrition: Heart healthy Tolerating Diet: Yes Tolerating PT: Await evaluation   DRUG ALLERGIES:   Allergies  Allergen Reactions  . Codeine Itching    VITALS:  Blood pressure 112/63, pulse 79, temperature 97.8 F (36.6 C), temperature source Oral, resp. rate 18, height 5\' 1"  (1.549 m), weight 115.667 kg (255 lb), SpO2 99 %.  PHYSICAL EXAMINATION:   Physical Exam  GENERAL:  55 y.o.-year-old patient lying in the bed in no acute distress.  EYES: Pupils equal, round, reactive to light and accommodation. No scleral icterus. Extraocular muscles intact.  HEENT: Head atraumatic, normocephalic. Oropharynx and nasopharynx clear.  NECK:  Supple, no jugular venous distention. No thyroid enlargement, no tenderness.  LUNGS: Normal breath sounds bilaterally, no wheezing, rales, rhonchi. No use of accessory muscles of respiration.  CARDIOVASCULAR: S1, S2 normal. No murmurs, rubs, or gallops.  ABDOMEN: Soft, nontender, nondistended. Bowel sounds  present. No organomegaly or mass.  EXTREMITIES: No cyanosis, clubbing, +1 dependent edema bilaterally.   NEUROLOGIC: Cranial nerves II through XII are intact. No focal Motor or sensory deficits b/l.   PSYCHIATRIC: The patient is alert and oriented x 3.  SKIN: No obvious rash, lesion, left heel ulcer with clean base and no acute drainage. Left lower extremity swelling redness consistent with cellulitis.  LABORATORY PANEL:   CBC  Recent Labs Lab 05/13/15 0553  WBC 5.5  HGB 11.9*  HCT 35.8  PLT 156   ------------------------------------------------------------------------------------------------------------------  Chemistries   Recent Labs Lab 05/12/15 2253 05/13/15 0553  NA 137  --   K 3.4*  --   CL 99*  --   CO2 33*  --   GLUCOSE 94  --   BUN 14  --   CREATININE 1.12* 1.03*  CALCIUM 9.2  --    ------------------------------------------------------------------------------------------------------------------  Cardiac Enzymes No results for input(s): TROPONINI in the last 168 hours. ------------------------------------------------------------------------------------------------------------------  RADIOLOGY:  US Venous Img Lower Unilateral Left  05/12/2015  CLINICAL DATA:  Acute onset of left leg pain and swelling. Left heel ulceration. Initial encounter. EXAM: LEFT LOWER EXTREMITY VENOUS DOPPLER ULTRASOUND TECHNIQUE: Gray-scale sonography with graded compression, as well as color Doppler and duplex ultrasound were performed to evaluate the lower extremity deep venous systems from the level of the common femoral vein and including the common femoral, femoral, profunda femoral, popliteal and calf veins including the posterior tibial, peroneal and gastrocnemius veins when visible. The superficial great saphenous vein was also interrogated. Spectral Doppler was utilized to evaluate flow at rest and with distal augmentation maneuvers in the common femoral, femoral and popliteal  veins. COMPARISON:  None. FINDINGS: Contralateral Common Femoral Vein: Respiratory phasicity is normal and symmetric with the symptomatic side. No evidence of thrombus. Normal compressibility. Common Femoral Vein: No evidence of thrombus. Normal compressibility, respiratory phasicity and response to augmentation. Saphenofemoral Junction: No evidence of thrombus. Normal compressibility and flow on color Doppler imaging. Profunda Femoral Vein: No evidence of thrombus. Normal compressibility and flow on color Doppler imaging. Femoral Vein: No evidence of thrombus. Normal compressibility, respiratory phasicity and response to augmentation. Popliteal Vein: No evidence of thrombus. Normal compressibility, respiratory phasicity and response to augmentation. Calf Veins: No evidence of thrombus. Normal compressibility and flow on color Doppler imaging. The peroneal vein is not visualized. Superficial Great Saphenous Vein: No evidence of thrombus. Normal compressibility and flow on color Doppler imaging. Venous Reflux:  None. Other Findings:  None. IMPRESSION: No evidence of deep venous thrombosis. Electronically Signed   By: Garald Balding M.D.   On: 05/12/2015 23:51     ASSESSMENT AND PLAN:   56 year old female with past medical history of COPD, PTSD, history of bipolar, previous CVA, depression who presented to the hospital and left lower extremity redness swelling and pain.  #1 left lower extremity cellulitis-this is the cause of patient's redness swelling and pain. -Doppler lower extremities negative for DVT. -Continue vancomycin, Zosyn and can narrow antibiotics by tomorrow if her cultures are negative and she is clinically better. - afebrile, WBC count normal.   #2 left lower heel ulcer-continue local wound care as per wound team.  #3 history of bipolar disorder-continue Seroquel, Depakote.  #4 neuropathy-continue Lyrica.  #5 depression/Anxiety-continue Lexapro/Xanax.  #6 hyperlipidemia-continue  atorvastatin.  Will get PT eval.   All the records are reviewed and case discussed with Care Management/Social Workerr. Management plans discussed with the patient, family and they are in agreement.  CODE STATUS: Full  DVT Prophylaxis: Lovenox  TOTAL TIME TAKING CARE OF THIS PATIENT: 30 minutes.   POSSIBLE D/C IN 1-2 DAYS, DEPENDING ON CLINICAL CONDITION.   Henreitta Leber M.D on 05/13/2015 at 2:28 PM  Between 7am to 6pm - Pager - [phone]  After 6pm go to [email or web address] - password [email or web address] [facility]  Office  [phone]  CC: Primary care physician; Bobetta Lime, MD

## 2015-05-13 NOTE — Telephone Encounter (Signed)
I spoke with home health nurse that has been seeing [place] for several months now, last seen early to mid Feb 2016 prior to patient having left heel ulcer and now cellulitis issues. Today the nurse tried to set up follow up visit but patient stated she was in the hospital for infected ulcer. Overall we have decided to DC home nursing care for now and re-order based on new needs near future as needed. The nurse does express to me concerns that she was a difficult patient to get a hold of, she sleeps all day and stays up all night. Again suspected self dosing of benzodiazepine prescribed by psychiatrist.

## 2015-05-13 NOTE — Progress Notes (Signed)
Order for enoxaparin 40 mg subcutaneously daily changed to 40 mg BID per anticoagulation protocol for CrCl > 30 mL/min and BMI > 40.  Lenis Noon, PharmD

## 2015-05-13 NOTE — ED Notes (Signed)
Pt in room at this time. Helene Kelp, RN at bedside to see patient. PT left with 2 bags of clothing, shoes, pocketbook and medications in room.

## 2015-05-13 NOTE — ED Notes (Signed)
Pt placed on 2L [place] states that she wears at home at night. PT 89% on RA.

## 2015-05-13 NOTE — ED Provider Notes (Signed)
[facility] Emergency Department Provider Note  ____________________________________________  Time seen: Approximately 2:28 AM  I have reviewed the triage vital signs and the nursing notes.   HISTORY  Chief Complaint Leg Pain and Leg Swelling    HPI Nicole Austin is a 55 y.o. female patient reports she had a small wound on her heel she went to the wound clinic and then Dr. Elvina Mattes who debrided it gave her a cast boot to walk with. She is very unhappy she says the wound is gotten worse in fact she thinks Dr. Elvina Mattes is not treated her properly. I tried to explain the care that she has received based on what she is told me sounds to be entirely appropriate at any rate she was sent here today because a wound care nurse who visits her at home thought she had cellulitis. The leg is red swollen warm and tender. Ultrasound showed no DVT. Patient is not running a fever. Patient has a total knee replacement in that leg. The redness is up to the knee at least.  Past Medical History  Diagnosis Date  . COPD (chronic obstructive pulmonary disease) ([place])   . PTSD (post-traumatic stress disorder)   . Rhabdomyolysis   . Bipolar 1 disorder ([place])   . Arthritis   . Stroke ([place])   . MI (myocardial infarction) ([place])   . Depression   . ADHD (attention deficit hyperactivity disorder)   . Asthma     Patient Active Problem List   Diagnosis Date Noted  . Foot ulcer, left ([place]) 04/27/2015  . Hyperlipidemia LDL goal <100 04/27/2015  . Chronic pain disorder 04/27/2015  . Stroke ([place]) 03/25/2015  . Bipolar I disorder, most recent episode depressed ([place]) 12/22/2014  . Morbid obesity ([place]) 12/22/2014  . Anxiety 08/16/2014  . Affective bipolar disorder ([place]) 08/16/2014  . Gout 08/16/2014  . Extreme obesity ([place]) 08/16/2014  . Muscle spasms of both lower extremities 08/16/2014  . Back pain, chronic 08/13/2014  . Low back pain with sciatica 08/04/2014  . Compulsive tobacco  user syndrome 08/04/2014  . Current tobacco use 08/04/2014  . Polysubstance abuse 07/04/2014  . Anxiety, generalized 11/24/2013  . Imbalance 11/24/2013  . Blurred vision 11/24/2013  . Cephalalgia 11/24/2013  . COPD, moderate ([place]) 11/18/2013  . Moderate COPD (chronic obstructive pulmonary disease) ([place]) 11/18/2013  . HPV (human papilloma virus) infection 07/17/2013  . Arthritis of knee, degenerative 07/15/2013  . H/O neoplasm 07/31/2011    Past Surgical History  Procedure Laterality Date  . Replacement total knee Left     Current Outpatient Rx  Name  Route  Sig  Dispense  Refill  . acetaminophen (TYLENOL) 325 MG tablet   Oral   Take 2 tablets (650 mg total) by mouth every 6 (six) hours as needed for mild pain (or Fever >/= 101).         Marland Kitchen albuterol (PROVENTIL HFA;VENTOLIN HFA) 108 (90 Base) MCG/ACT inhaler   Inhalation   Inhale 1-2 puffs into the lungs every 6 (six) hours as needed for wheezing or shortness of breath.   6.7 g   1   . ALPRAZolam (XANAX) 1 MG tablet   Oral   Take 1 tablet (1 mg total) by mouth 2 (two) times daily as needed for anxiety.   45 tablet   2   . aspirin EC 81 MG tablet   Oral   Take 1 tablet (81 mg total) by mouth daily.         Marland Kitchen  atorvastatin (LIPITOR) 40 MG tablet   Oral   Take 1 tablet (40 mg total) by mouth at bedtime.   30 tablet   5   . clindamycin (CLEOCIN) 300 MG capsule   Oral   Take 1 capsule (300 mg total) by mouth 3 (three) times daily.   30 capsule   0   . cyclobenzaprine (FLEXERIL) 5 MG tablet   Oral   Take 1 tablet (5 mg total) by mouth 3 (three) times daily as needed for muscle spasms.   90 tablet   2   . divalproex (DEPAKOTE) 250 MG DR tablet   Oral   Take 1 tablet (250 mg total) by mouth 2 (two) times daily.   60 tablet   1   . escitalopram (LEXAPRO) 20 MG tablet   Oral   Take 1 tablet (20 mg total) by mouth every other day.   30 tablet   2   . glucosamine-chondroitin 500-400 MG tablet   Oral    Take 1 tablet by mouth 2 (two) times daily.         Marland Kitchen ipratropium-albuterol (DUONEB) 0.5-2.5 (3) MG/3ML SOLN   Nebulization   Take 3 mLs by nebulization every 4 (four) hours as needed. Patient taking differently: Take 3 mLs by nebulization every 4 (four) hours as needed (for wheezing).    360 mL   6   . lidocaine (XYLOCAINE) 5 % ointment   Topical   Apply 1 application topically as needed for mild pain.         Marland Kitchen LYRICA 200 MG capsule   Oral   Take 1 capsule (200 mg total) by mouth 2 (two) times daily.   60 capsule   5     Dispense as written.   . Multiple Vitamin (MULTIVITAMIN WITH MINERALS) TABS tablet   Oral   Take 1 tablet by mouth daily.         . naproxen (NAPROSYN) 500 MG tablet   Oral   Take 1 tablet (500 mg total) by mouth 2 (two) times daily with a meal.   30 tablet   0   . QUEtiapine (SEROQUEL XR) 300 MG 24 hr tablet   Oral   Take 300 mg by mouth at bedtime.         . traMADol (ULTRAM) 50 MG tablet   Oral   Take 1 tablet (50 mg total) by mouth every 12 (twelve) hours as needed.   60 tablet   0     Refill 04/27/15     Allergies Codeine  Family History  Problem Relation Age of Onset  . Hernia Mother   . Heart disease Mother   . OCD Mother   . Diabetes Mother   . Parkinson's disease Father   . Bipolar disorder Sister   . Schizophrenia Sister   . ADD / ADHD Sister   . Alcohol abuse Brother   . Bipolar disorder Sister   . Paranoid behavior Sister   . ADD / ADHD Sister     Social History Social History  Substance Use Topics  . Smoking status: Current Every Day Smoker -- 0.50 packs/day    Types: Cigarettes    Start date: 10/11/1984  . Smokeless tobacco: Never Used  . Alcohol Use: No    Review of Systems Constitutional: No fever/chills Eyes: No visual changes. ENT: No sore throat. Cardiovascular: Denies chest pain. Respiratory: Denies shortness of breath. Gastrointestinal: No abdominal pain.  No nausea, no vomiting.  No  diarrhea.  No constipation. Genitourinary: Negative for dysuria. Musculoskeletal: Negative for Any change in her back pain. Skin: Negative for rash. Neurological: Negative for headaches, focal weakness or numbness.  10-point ROS otherwise negative.  ____________________________________________   PHYSICAL EXAM:  VITAL SIGNS: ED Triage Vitals  Enc Vitals Group     BP 05/12/15 2241 115/51 mmHg     Pulse Rate 05/12/15 2241 88     Resp 05/12/15 2241 18     Temp 05/12/15 2241 97.7 F (36.5 C)     Temp Source 05/12/15 2241 Oral     SpO2 05/12/15 2241 94 %     Weight 05/12/15 2241 255 lb (115.667 kg)     Height 05/12/15 2241 5\' 1"  (1.549 m)     Head Cir --      Peak Flow --      Pain Score 05/12/15 2241 10     Pain Loc --      Pain Edu? --      Excl. in [place]? --     Constitutional: Alert and oriented. Well appearing and in no acute distress. Eyes: Conjunctivae are normal. PERRL. EOMI. Head: Atraumatic. Nose: No congestion/rhinnorhea. Mouth/Throat: Mucous membranes are moist.  Oropharynx non-erythematous. Neck: No stridor.  Cardiovascular: Normal rate, regular rhythm. Grossly normal heart sounds.  Good peripheral circulation. Respiratory: Normal respiratory effort.  No retractions. Lungs CTAB. Gastrointestinal: Soft and nontender. No distention. No abdominal bruits. No CVA tenderness. }Musculoskeletal:Left leg is red warm somewhat swollen and tender. The heel has a tonic appearing wound on it with some apparently necrotic tissue surrounding it. Does not have a bad odor at the present time. Neurologic:  Normal speech and language. No gross focal neurologic deficits are appreciated. No gait instability. Skin:  Skin is warm, dry and intact. No rash noted. Psychiatric: Mood and affect are normal. Speech and behavior are normal.  ____________________________________________   LABS (all labs ordered are listed, but only abnormal results are displayed)  Labs Reviewed  CBC WITH  DIFFERENTIAL/PLATELET - Abnormal; Notable for the following:    RDW 16.9 (*)    Eosinophils Absolute 1.1 (*)    All other components within normal limits  BASIC METABOLIC PANEL - Abnormal; Notable for the following:    Potassium 3.4 (*)    Chloride 99 (*)    CO2 33 (*)    Creatinine, Ser 1.12 (*)    GFR calc non Af Amer 54 (*)    All other components within normal limits   ____________________________________________  EKG   ____________________________________________  RADIOLOGY  Ultrasound read by radiology as no DVT ____________________________________________   PROCEDURES   ____________________________________________   INITIAL IMPRESSION / ASSESSMENT AND PLAN / ED COURSE  Pertinent labs & imaging results that were available during my care of the patient were reviewed by me and considered in my medical decision making (see chart for details).   ____________________________________________   FINAL CLINICAL IMPRESSION(S) / ED DIAGNOSES  Final diagnoses:  Cellulitis of left lower extremity      Nena Polio, MD 05/13/15 [phone]

## 2015-05-13 NOTE — Care Management (Addendum)
Per [facility] January admission : Admitted to [facility] with the diagnosis of stroke. Lives alone in her apartment in [place] x 7 years in February. Last seen Dr. Nadine Counts less than a month ago. Sister is Lisbeth Renshaw [phone]). Chronic home oxygen was established 02/27/15 during that admission here at [facility]. [facility] in the past. Home Health/Physical therapy 3-4 years ago. Doesn't remember the name of the agency. Uses a rollator and wheelchair to aid in getting around. States she has been getting services thru [facility]. No falls. Fair appetite. Physical therapy evaluation completed. Skilled Nursing vs Home Health. States she prefers to go home with home health thru [facility]. Sister will transport. Shelbie Ammons RN MSN CCM Care management [phone].  RNCM will continue to follow. I have asked Corene Cornea with [facility] and they are current with patient but have concerns about continuing service. Chronic O2 through [facility].

## 2015-05-13 NOTE — Consult Note (Signed)
WOC wound consult note Reason for Consult:Chronic nonhealing unstageable pressure injury to left heel, was seen by podiatry.  Does not recall what recommended treatment was. States she was wearing a wrap on the left leg, but refuses one now due to pain.  Wound type:Chronic nonhealing pressure injury to left heel, cellulitis to left lower leg Pressure Ulcer POA: Yes Measurement:1 cm x 1 cm nonintact skin with callous present circumferentially and devitalized tissue to wound bed.  Wound [id number] tissue and callous.  Will begin enzymatic debridement.  Drainage (amount, consistency, odor) minimal serosanguinous Periwound:Callous to periwound on heel Left lower extremity of erythematous and edematous.  Dressing procedure/placement/frequency:Cleanse left heel with NS and pat gently dry.  Apply Santyl to wound bed.  Cover with NS moist gauze.  Secure with 4x4 gauze, kerlix and tape.  Change daily.  Refusing compression at this time.   Will not follow at this time.  Please re-consult if needed.  Domenic Moras RN BSN [place] Pager [phone]

## 2015-05-13 NOTE — Care Management Obs Status (Signed)
[place] NOTIFICATION   Patient Details  Name: Dorothy Sather MRN: [id number] Date of Birth: 22-May-1960   Medicare Observation Status Notification Given:  Yes Patient on isolation- verbal acknowledgement. Copy left with patient.   Marshell Garfinkel, RN 05/13/2015, 10:28 AM

## 2015-05-13 NOTE — ED Notes (Signed)
Measurement of left calf 18 1/4", right calf 16 1/2".  Redness and swelling present on left calf.

## 2015-05-13 NOTE — H&P (Signed)
[facility] - [place] at [facility]   PATIENT NAME: Nicole Austin    MR#:  [id number]  DATE OF BIRTH:  [birth year]  DATE OF ADMISSION:  05/13/2015  PRIMARY CARE PHYSICIAN: Edwena Felty, MD   REQUESTING/REFERRING PHYSICIAN: Darnelle Catalan, MD  CHIEF COMPLAINT:   Chief Complaint  Patient presents with  . Leg Pain  . Leg Swelling    HISTORY OF PRESENT ILLNESS:  Nicole Austin  is a 55 y.o. female who presents with Left leg cellulitis. Patient has a left heel ulcer which has been followed by podiatry. She recently had bandaging and wrapping procedures done to try and help the heel ulcer heal. About a week ago she began to develop some erythema and tenderness to that leg. This started distally and has progressed proximally over the week. She came to the ED today because her home health nurse was concerned about the way her leg looked, and after hearing her symptoms of extreme tenderness in conjunction with her physical exam findings of erythema and warmth told her she needed to have the leg looked at. In the ED her workup was largely benign, but she does have what seems to be ascending cellulitis on the leg.  Hospitalists were called for further evaluation and admission for IV antibiotics.  PAST MEDICAL HISTORY:   Past Medical History  Diagnosis Date  . COPD (chronic obstructive pulmonary disease) (HCC)   . PTSD (post-traumatic stress disorder)   . Rhabdomyolysis   . Bipolar 1 disorder (HCC)   . Arthritis   . Stroke (HCC)   . MI (myocardial infarction) (HCC)   . Depression   . ADHD (attention deficit hyperactivity disorder)   . Asthma     PAST SURGICAL HISTORY:   Past Surgical History  Procedure Laterality Date  . Replacement total knee Left     SOCIAL HISTORY:   Social History  Substance Use Topics  . Smoking status: Current Every Day Smoker -- 0.50 packs/day    Types: Cigarettes    Start date: 10/11/1984  . Smokeless tobacco: Never Used  . Alcohol  Use: No    FAMILY HISTORY:   Family History  Problem Relation Age of Onset  . Hernia Mother   . Heart disease Mother   . OCD Mother   . Diabetes Mother   . Parkinson's disease Father   . Bipolar disorder Sister   . Schizophrenia Sister   . ADD / ADHD Sister   . Alcohol abuse Brother   . Bipolar disorder Sister   . Paranoid behavior Sister   . ADD / ADHD Sister     DRUG ALLERGIES:   Allergies  Allergen Reactions  . Codeine Itching    MEDICATIONS AT HOME:   Prior to Admission medications   Medication Sig Start Date End Date Taking? Authorizing Provider  acetaminophen (TYLENOL) 325 MG tablet Take 2 tablets (650 mg total) by mouth every 6 (six) hours as needed for mild pain (or Fever >/= 101). 03/28/15  Yes Ramonita Lab, MD  albuterol (PROVENTIL HFA;VENTOLIN HFA) 108 (90 Base) MCG/ACT inhaler Inhale 1-2 puffs into the lungs every 6 (six) hours as needed for wheezing or shortness of breath. 03/14/15  Yes Edwena Felty, MD  ALPRAZolam Prudy Feeler) 1 MG tablet Take 1 tablet (1 mg total) by mouth 2 (two) times daily as needed for anxiety. 04/21/15  Yes Himabindu Ravi, MD  aspirin EC 81 MG tablet Take 1 tablet (81 mg total) by mouth daily. 03/28/15  Yes Ramonita Lab, MD  atorvastatin (LIPITOR) 40 MG tablet Take 1 tablet (40 mg total) by mouth at bedtime. 04/27/15  Yes Edwena Felty, MD  cyclobenzaprine (FLEXERIL) 5 MG tablet Take 1 tablet (5 mg total) by mouth 3 (three) times daily as needed for muscle spasms. 04/27/15  Yes Edwena Felty, MD  divalproex (DEPAKOTE) 250 MG DR tablet Take 1 tablet (250 mg total) by mouth 2 (two) times daily. 04/21/15  Yes Himabindu Ravi, MD  escitalopram (LEXAPRO) 20 MG tablet Take 1 tablet (20 mg total) by mouth every other day. 04/21/15  Yes Himabindu Ravi, MD  glucosamine-chondroitin 500-400 MG tablet Take 1 tablet by mouth 2 (two) times daily.   Yes Historical Provider, MD  ipratropium-albuterol (DUONEB) 0.5-2.5 (3) MG/3ML SOLN Take 3 mLs by nebulization every  4 (four) hours as needed. Patient taking differently: Take 3 mLs by nebulization every 4 (four) hours as needed (for wheezing).  03/07/15  Yes Katharina Caper, MD  lidocaine (XYLOCAINE) 5 % ointment Apply 1 application topically as needed for mild pain.   Yes Historical Provider, MD  LYRICA 200 MG capsule Take 1 capsule (200 mg total) by mouth 2 (two) times daily. 04/27/15  Yes Edwena Felty, MD  Multiple Vitamin (MULTIVITAMIN WITH MINERALS) TABS tablet Take 1 tablet by mouth daily.   Yes Historical Provider, MD  naproxen (NAPROSYN) 500 MG tablet Take 1 tablet (500 mg total) by mouth 2 (two) times daily with a meal. 01/26/15  Yes Tommi Rumps, PA-C  QUEtiapine (SEROQUEL XR) 300 MG 24 hr tablet Take 300 mg by mouth at bedtime.   Yes Historical Provider, MD  traMADol (ULTRAM) 50 MG tablet Take 1 tablet (50 mg total) by mouth every 12 (twelve) hours as needed. 04/27/15  Yes Edwena Felty, MD  clindamycin (CLEOCIN) 300 MG capsule Take 1 capsule (300 mg total) by mouth 3 (three) times daily. 04/22/15   Phineas Semen, MD    REVIEW OF SYSTEMS:  Review of Systems  Constitutional: Negative for fever, chills, weight loss and malaise/fatigue.  HENT: Negative for ear pain, hearing loss and tinnitus.   Eyes: Negative for blurred vision, double vision, pain and redness.  Respiratory: Negative for cough, hemoptysis and shortness of breath.   Cardiovascular: Negative for chest pain, palpitations, orthopnea and leg swelling.  Gastrointestinal: Negative for nausea, vomiting, abdominal pain, diarrhea and constipation.  Genitourinary: Negative for dysuria, frequency and hematuria.  Musculoskeletal: Negative for back pain, joint pain and neck pain.       Left leg tenderness  Skin:       Ascending erythema left leg. Left heel ulcer. No acne, rash  Neurological: Negative for dizziness, tremors, focal weakness and weakness.  Endo/Heme/Allergies: Negative for polydipsia. Does not bruise/bleed easily.   Psychiatric/Behavioral: Negative for depression. The patient is not nervous/anxious and does not have insomnia.      VITAL SIGNS:   Filed Vitals:   05/12/15 2241  BP: 115/51  Pulse: 88  Temp: 97.7 F (36.5 C)  TempSrc: Oral  Resp: 18  Height: 5\' 1"  (1.549 m)  Weight: 115.667 kg (255 lb)  SpO2: 94%   Wt Readings from Last 3 Encounters:  05/12/15 115.667 kg (255 lb)  04/27/15 118.389 kg (261 lb)  04/22/15 115.214 kg (254 lb)    PHYSICAL EXAMINATION:  Physical Exam  Vitals reviewed. Constitutional: She is oriented to person, place, and time. She appears well-developed and well-nourished. No distress.  HENT:  Head: Normocephalic and atraumatic.  Mouth/Throat: Oropharynx is clear and moist.  Eyes: Conjunctivae and EOM  are normal. Pupils are equal, round, and reactive to light. No scleral icterus.  Neck: Normal range of motion. Neck supple. No JVD present. No thyromegaly present.  Cardiovascular: Normal rate, regular rhythm and intact distal pulses.  Exam reveals no gallop and no friction rub.   No murmur heard. Respiratory: Effort normal and breath sounds normal. No respiratory distress. She has no wheezes. She has no rales.  GI: Soft. Bowel sounds are normal. She exhibits no distension. There is no tenderness.  Musculoskeletal: Normal range of motion. She exhibits tenderness (Left leg tenderness). She exhibits no edema.  No arthritis, no gout  Lymphadenopathy:    She has no cervical adenopathy.  Neurological: She is alert and oriented to person, place, and time. No cranial nerve deficit.  No dysarthria, no aphasia  Skin: Skin is warm and dry. No rash noted. There is erythema (left leg).  Left heel ulcer, no drainage  Psychiatric: She has a normal mood and affect. Her behavior is normal. Judgment and thought content normal.    LABORATORY PANEL:   CBC  Recent Labs Lab 05/12/15 2253  WBC 5.3  HGB 12.7  HCT 38.5  PLT 164    ------------------------------------------------------------------------------------------------------------------  Chemistries   Recent Labs Lab 05/12/15 2253  NA 137  K 3.4*  CL 99*  CO2 33*  GLUCOSE 94  BUN 14  CREATININE 1.12*  CALCIUM 9.2   ------------------------------------------------------------------------------------------------------------------  Cardiac Enzymes No results for input(s): TROPONINI in the last 168 hours. ------------------------------------------------------------------------------------------------------------------  RADIOLOGY:  US Venous Img Lower Unilateral Left  05/12/2015  CLINICAL DATA:  Acute onset of left leg pain and swelling. Left heel ulceration. Initial encounter. EXAM: LEFT LOWER EXTREMITY VENOUS DOPPLER ULTRASOUND TECHNIQUE: Gray-scale sonography with graded compression, as well as color Doppler and duplex ultrasound were performed to evaluate the lower extremity deep venous systems from the level of the common femoral vein and including the common femoral, femoral, profunda femoral, popliteal and calf veins including the posterior tibial, peroneal and gastrocnemius veins when visible. The superficial great saphenous vein was also interrogated. Spectral Doppler was utilized to evaluate flow at rest and with distal augmentation maneuvers in the common femoral, femoral and popliteal veins. COMPARISON:  None. FINDINGS: Contralateral Common Femoral Vein: Respiratory phasicity is normal and symmetric with the symptomatic side. No evidence of thrombus. Normal compressibility. Common Femoral Vein: No evidence of thrombus. Normal compressibility, respiratory phasicity and response to augmentation. Saphenofemoral Junction: No evidence of thrombus. Normal compressibility and flow on color Doppler imaging. Profunda Femoral Vein: No evidence of thrombus. Normal compressibility and flow on color Doppler imaging. Femoral Vein: No evidence of thrombus. Normal  compressibility, respiratory phasicity and response to augmentation. Popliteal Vein: No evidence of thrombus. Normal compressibility, respiratory phasicity and response to augmentation. Calf Veins: No evidence of thrombus. Normal compressibility and flow on color Doppler imaging. The peroneal vein is not visualized. Superficial Great Saphenous Vein: No evidence of thrombus. Normal compressibility and flow on color Doppler imaging. Venous Reflux:  None. Other Findings:  None. IMPRESSION: No evidence of deep venous thrombosis. Electronically Signed   By: Roanna Raider M.D.   On: 05/12/2015 23:51    EKG:   Orders placed or performed during the hospital encounter of 03/25/15  . ED EKG  . ED EKG  . EKG 12-Lead  . EKG 12-Lead  . EKG 12-Lead  . EKG 12-Lead    IMPRESSION AND PLAN:  Principal Problem:   Cellulitis - IV antibiotics initiated in the ED. We'll continue these based on  cellulitis order set. Blood culture sent. Active Problems:   Anxiety - continue home anxiolytics   COPD, moderate (HCC) - continue home meds including when necessary nebs   Bipolar I disorder, most recent episode depressed (HCC) - continue home meds   HLD (hyperlipidemia) - home dose statin  All the records are reviewed and case discussed with ED provider. Management plans discussed with the patient and/or family.  DVT PROPHYLAXIS: SubQ lovenox  GI PROPHYLAXIS: None  ADMISSION STATUS: Observation  CODE STATUS: Full Code Status History    Date Active Date Inactive Code Status Order ID Comments User Context   03/25/2015  9:18 PM 03/28/2015  8:48 PM Full Code [id number]  Wyatt Haste, MD ED   03/02/2015  1:28 AM 03/08/2015  1:49 PM Full Code [id number]  Wyatt Haste, MD ED   07/02/2014  3:15 PM 07/03/2014  5:27 PM Full Code [id number]  Alford Highland, MD ED      TOTAL TIME TAKING CARE OF THIS PATIENT: 40 minutes.    Albirtha Grinage FIELDING 05/13/2015, 3:15 AM  [facility]  Office   [phone]  CC: Primary care physician; Edwena Felty, MD

## 2015-05-14 DIAGNOSIS — F419 Anxiety disorder, unspecified: Secondary | ICD-10-CM | POA: Diagnosis not present

## 2015-05-14 DIAGNOSIS — L03116 Cellulitis of left lower limb: Secondary | ICD-10-CM | POA: Diagnosis not present

## 2015-05-14 DIAGNOSIS — L89629 Pressure ulcer of left heel, unspecified stage: Secondary | ICD-10-CM | POA: Diagnosis not present

## 2015-05-14 DIAGNOSIS — F319 Bipolar disorder, unspecified: Secondary | ICD-10-CM | POA: Diagnosis not present

## 2015-05-14 DIAGNOSIS — J449 Chronic obstructive pulmonary disease, unspecified: Secondary | ICD-10-CM | POA: Diagnosis not present

## 2015-05-14 LAB — BASIC METABOLIC PANEL
ANION GAP: 3 — AB (ref 5–15)
BUN: 17 mg/dL (ref 6–20)
CALCIUM: 8.6 mg/dL — AB (ref 8.9–10.3)
CO2: 34 mmol/L — AB (ref 22–32)
Chloride: 100 mmol/L — ABNORMAL LOW (ref 101–111)
Creatinine, Ser: 0.94 mg/dL (ref 0.44–1.00)
GFR calc non Af Amer: >60 mL/min (ref >60)
GLUCOSE: 104 mg/dL — AB (ref 65–99)
POTASSIUM: 3.6 mmol/L (ref 3.5–5.1)
Sodium: 137 mmol/L (ref 135–145)

## 2015-05-14 LAB — CBC
HEMATOCRIT: 35.4 % (ref 35.0–47.0)
HEMOGLOBIN: 11.6 g/dL — AB (ref 12.0–16.0)
MCH: 29.7 pg (ref 26.0–34.0)
MCHC: 32.7 g/dL (ref 32.0–36.0)
MCV: 90.6 fL (ref 80.0–100.0)
Platelets: 160 10*3/uL (ref 150–440)
RBC: 3.9 MIL/uL (ref 3.80–5.20)
RDW: 16.6 % — ABNORMAL HIGH (ref 11.5–14.5)
WBC: 5.3 10*3/uL (ref 3.6–11.0)

## 2015-05-14 MED ORDER — AMOXICILLIN-POT CLAVULANATE 875-125 MG PO TABS: 1.0000 | ORAL_TABLET | Freq: Two times a day (BID) | ORAL | Status: DC

## 2015-05-14 MED ORDER — COLLAGENASE 250 UNIT/GM EX OINT: TOPICAL_OINTMENT | Freq: Every day | CUTANEOUS | Status: DC

## 2015-05-14 NOTE — Discharge Instructions (Signed)
Dressing changes per instruction. Keep the area dry.

## 2015-05-14 NOTE — Discharge Summary (Signed)
[facility] - [place] at [facility]   PATIENT NAME: Nicole Austin    MR#:  [id number]  DATE OF BIRTH:  08-17-1960  DATE OF ADMISSION:  05/13/2015 ADMITTING PHYSICIAN: Oralia Manis, MD  DATE OF DISCHARGE: 05/14/2015  PRIMARY CARE PHYSICIAN: Edwena Felty, MD    ADMISSION DIAGNOSIS:  Cellulitis of left lower extremity [L03.116]  DISCHARGE DIAGNOSIS:  Chronic left heel ulcer with left lower extremity cellulitis improved  SECONDARY DIAGNOSIS:   Past Medical History  Diagnosis Date  . COPD (chronic obstructive pulmonary disease) (HCC)   . PTSD (post-traumatic stress disorder)   . Rhabdomyolysis   . Bipolar 1 disorder (HCC)   . Arthritis   . Stroke (HCC)   . MI (myocardial infarction) (HCC)   . Depression   . ADHD (attention deficit hyperactivity disorder)   . Asthma     HOSPITAL COURSE:   55 year old female with past medical history of COPD, PTSD, history of bipolar, previous CVA, depression who presented to the hospital and left lower extremity redness swelling and pain.  #1 left lower extremity cellulitis-this is the cause of patient's redness swelling and pain. -Doppler lower extremities negative for DVT. -pt was onvancomycin, Zosyn ----> po augmentin - afebrile, WBC count normal.  -BC neg Redness improved a lot  #2 left lower heel ulcer-continue local wound care as per wound team. Appears dry  #3 history of bipolar disorder-continue Seroquel, Depakote.  #4 neuropathy-continue Lyrica.  #5 depression/Anxiety-continue Lexapro/Xanax.  #6 hyperlipidemia-continue atorvastatin. Overall stable D/c home  CONSULTS OBTAINED:     DRUG ALLERGIES:   Allergies  Allergen Reactions  . Codeine Itching    DISCHARGE MEDICATIONS:   Current Discharge Medication List    START taking these medications   Details  amoxicillin-clavulanate (AUGMENTIN) 875-125 MG tablet Take 1 tablet by mouth every 12 (twelve) hours. Qty: 14 tablet, Refills: 0     collagenase (SANTYL) ointment Apply topically daily. Qty: 15 g, Refills: 0      CONTINUE these medications which have NOT CHANGED   Details  acetaminophen (TYLENOL) 325 MG tablet Take 2 tablets (650 mg total) by mouth every 6 (six) hours as needed for mild pain (or Fever >/= 101).    albuterol (PROVENTIL HFA;VENTOLIN HFA) 108 (90 Base) MCG/ACT inhaler Inhale 1-2 puffs into the lungs every 6 (six) hours as needed for wheezing or shortness of breath. Qty: 6.7 g, Refills: 1   Associated Diagnoses: COPD, moderate (HCC)    ALPRAZolam (XANAX) 1 MG tablet Take 1 tablet (1 mg total) by mouth 2 (two) times daily as needed for anxiety. Qty: 45 tablet, Refills: 2    aspirin EC 81 MG tablet Take 1 tablet (81 mg total) by mouth daily.    atorvastatin (LIPITOR) 40 MG tablet Take 1 tablet (40 mg total) by mouth at bedtime. Qty: 30 tablet, Refills: 5   Associated Diagnoses: Hyperlipidemia LDL goal <100    cyclobenzaprine (FLEXERIL) 5 MG tablet Take 1 tablet (5 mg total) by mouth 3 (three) times daily as needed for muscle spasms. Qty: 90 tablet, Refills: 2   Associated Diagnoses: Muscle spasms of both lower extremities    divalproex (DEPAKOTE) 250 MG DR tablet Take 1 tablet (250 mg total) by mouth 2 (two) times daily. Qty: 60 tablet, Refills: 1    escitalopram (LEXAPRO) 20 MG tablet Take 1 tablet (20 mg total) by mouth every other day. Qty: 30 tablet, Refills: 2    glucosamine-chondroitin 500-400 MG tablet Take 1 tablet by mouth 2 (two)  times daily.    ipratropium-albuterol (DUONEB) 0.5-2.5 (3) MG/3ML SOLN Take 3 mLs by nebulization every 4 (four) hours as needed. Qty: 360 mL, Refills: 6    lidocaine (XYLOCAINE) 5 % ointment Apply 1 application topically as needed for mild pain.    LYRICA 200 MG capsule Take 1 capsule (200 mg total) by mouth 2 (two) times daily. Qty: 60 capsule, Refills: 5   Associated Diagnoses: Chronic pain disorder    Multiple Vitamin (MULTIVITAMIN WITH MINERALS)  TABS tablet Take 1 tablet by mouth daily.    naproxen (NAPROSYN) 500 MG tablet Take 1 tablet (500 mg total) by mouth 2 (two) times daily with a meal. Qty: 30 tablet, Refills: 0    QUEtiapine (SEROQUEL XR) 300 MG 24 hr tablet Take 300 mg by mouth at bedtime.    traMADol (ULTRAM) 50 MG tablet Take 1 tablet (50 mg total) by mouth every 12 (twelve) hours as needed. Qty: 60 tablet, Refills: 0   Associated Diagnoses: Foot ulcer, left, with fat layer exposed (HCC); Chronic pain disorder    clindamycin (CLEOCIN) 300 MG capsule Take 1 capsule (300 mg total) by mouth 3 (three) times daily. Qty: 30 capsule, Refills: 0        If you experience worsening of your admission symptoms, develop shortness of breath, life threatening emergency, suicidal or homicidal thoughts you must seek medical attention immediately by calling 911 or calling your MD immediately  if symptoms less severe.  You Must read complete instructions/literature along with all the possible adverse reactions/side effects for all the Medicines you take and that have been prescribed to you. Take any new Medicines after you have completely understood and accept all the possible adverse reactions/side effects.   Please note  You were cared for by a hospitalist during your hospital stay. If you have any questions about your discharge medications or the care you received while you were in the hospital after you are discharged, you can call the unit and asked to speak with the hospitalist on call if the hospitalist that took care of you is not available. Once you are discharged, your primary care physician will handle any further medical issues. Please note that NO REFILLS for any discharge medications will be authorized once you are discharged, as it is imperative that you return to your primary care physician (or establish a relationship with a primary care physician if you do not have one) for your aftercare needs so that they can reassess your  need for medications and monitor your lab values. Today   SUBJECTIVE   Doing well  VITAL SIGNS:  Blood pressure 108/56, pulse 79, temperature 98.9 F (37.2 C), temperature source Oral, resp. rate 20, height 5\' 1"  (1.549 m), weight 115.667 kg (255 lb), SpO2 98 %.  I/O:   Intake/Output Summary (Last 24 hours) at 05/14/15 1300 Last data filed at 05/14/15 0834  Gross per 24 hour  Intake    520 ml  Output    401 ml  Net    119 ml    PHYSICAL EXAMINATION:  GENERAL:  55 y.o.-year-old patient lying in the bed with no acute distress.  EYES: Pupils equal, round, reactive to light and accommodation. No scleral icterus. Extraocular muscles intact.  HEENT: Head atraumatic, normocephalic. Oropharynx and nasopharynx clear.  NECK:  Supple, no jugular venous distention. No thyroid enlargement, no tenderness.  LUNGS: Normal breath sounds bilaterally, no wheezing, rales,rhonchi or crepitation. No use of accessory muscles of respiration.  CARDIOVASCULAR: S1, S2 normal.  No murmurs, rubs, or gallops.  ABDOMEN: Soft, non-tender, non-distended. Bowel sounds present. No organomegaly or mass.  EXTREMITIES: No pedal edema, cyanosis, or clubbing. Left heel dry ulcer. Left LE redness resolved NEUROLOGIC: Cranial nerves II through XII are intact. Muscle strength 5/5 in all extremities. Sensation intact. Gait not checked.  PSYCHIATRIC:  patient is alert and oriented x 3.  SKIN: No obvious rash, lesion, or ulcer.   DATA REVIEW:   CBC   Recent Labs Lab 05/14/15 0515  WBC 5.3  HGB 11.6*  HCT 35.4  PLT 160    Chemistries   Recent Labs Lab 05/14/15 0515  NA 137  K 3.6  CL 100*  CO2 34*  GLUCOSE 104*  BUN 17  CREATININE 0.94  CALCIUM 8.6*    Microbiology Results   Recent Results (from the past 240 hour(s))  Culture, blood (routine x 2)     Status: None (Preliminary result)   Collection Time: 05/13/15  5:45 AM  Result Value Ref Range Status   Specimen Description BLOOD LEFT ANTECUBITAL   Final   Special Requests BOTTLES DRAWN AEROBIC AND ANAEROBIC  Final   Culture NO GROWTH 1 DAY  Final   Report Status PENDING  Incomplete  Culture, blood (routine x 2)     Status: None (Preliminary result)   Collection Time: 05/13/15  5:53 AM  Result Value Ref Range Status   Specimen Description BLOOD RIGHT ANTECUBITAL  Final   Special Requests BOTTLES DRAWN AEROBIC AND ANAEROBIC  Final   Culture NO GROWTH 1 DAY  Final   Report Status PENDING  Incomplete  MRSA PCR Screening     Status: None   Collection Time: 05/13/15  6:21 AM  Result Value Ref Range Status   MRSA by PCR NEGATIVE NEGATIVE Final    Comment:        The GeneXpert MRSA Assay (FDA approved for NASAL specimens only), is one component of a comprehensive MRSA colonization surveillance program. It is not intended to diagnose MRSA infection nor to guide or monitor treatment for MRSA infections.     RADIOLOGY:  US Venous Img Lower Unilateral Left  05/12/2015  CLINICAL DATA:  Acute onset of left leg pain and swelling. Left heel ulceration. Initial encounter. EXAM: LEFT LOWER EXTREMITY VENOUS DOPPLER ULTRASOUND TECHNIQUE: Gray-scale sonography with graded compression, as well as color Doppler and duplex ultrasound were performed to evaluate the lower extremity deep venous systems from the level of the common femoral vein and including the common femoral, femoral, profunda femoral, popliteal and calf veins including the posterior tibial, peroneal and gastrocnemius veins when visible. The superficial great saphenous vein was also interrogated. Spectral Doppler was utilized to evaluate flow at rest and with distal augmentation maneuvers in the common femoral, femoral and popliteal veins. COMPARISON:  None. FINDINGS: Contralateral Common Femoral Vein: Respiratory phasicity is normal and symmetric with the symptomatic side. No evidence of thrombus. Normal compressibility. Common Femoral Vein: No evidence of thrombus. Normal  compressibility, respiratory phasicity and response to augmentation. Saphenofemoral Junction: No evidence of thrombus. Normal compressibility and flow on color Doppler imaging. Profunda Femoral Vein: No evidence of thrombus. Normal compressibility and flow on color Doppler imaging. Femoral Vein: No evidence of thrombus. Normal compressibility, respiratory phasicity and response to augmentation. Popliteal Vein: No evidence of thrombus. Normal compressibility, respiratory phasicity and response to augmentation. Calf Veins: No evidence of thrombus. Normal compressibility and flow on color Doppler imaging. The peroneal vein is not visualized. Superficial Great Saphenous Vein: No  evidence of thrombus. Normal compressibility and flow on color Doppler imaging. Venous Reflux:  None. Other Findings:  None. IMPRESSION: No evidence of deep venous thrombosis. Electronically Signed   By: Roanna Raider M.D.   On: 05/12/2015 23:51     Management plans discussed with the patient, family and they are in agreement.  CODE STATUS:     Code Status Orders        Start     Ordered   05/13/15 0404  Full code   Continuous     05/13/15 0403    Code Status History    Date Active Date Inactive Code Status Order ID Comments User Context   03/25/2015  9:18 PM 03/28/2015  8:48 PM Full Code [id number]  Wyatt Haste, MD ED   03/02/2015  1:28 AM 03/08/2015  1:49 PM Full Code [id number]  Wyatt Haste, MD ED   07/02/2014  3:15 PM 07/03/2014  5:27 PM Full Code [id number]  Alford Highland, MD ED      TOTAL TIME TAKING CARE OF THIS PATIENT: 40 minutes.    Adianna Darwin M.D on 05/14/2015 at 1:00 PM  Between 7am to 6pm - Pager - [phone] After 6pm go to [email or web address] - password [email or web address] [facility]  [place] [facility]  Office  [phone]  CC: Primary care physician; Edwena Felty, MD

## 2015-05-14 NOTE — Progress Notes (Signed)
Patient being discharged to home. RN from [facility] to come in and change dressing on left foot. IV removed & belongings packed.  Rx's were sent to the incorrect pharmacy [facility]) per patient. Nurse made attempt to call pharmacy to have rx sent to [place] but they had closed. Patient stated that she would call Monday to have Rx's transferred.

## 2015-05-14 NOTE — Care Management Note (Signed)
Case Management Note  Patient Details  Name: Nicole Austin MRN: [id number] Date of Birth: 26-Sep-1960  Subjective/Objective:       A referral was faxed to [facility] requesting RN services for wound care on heel.            Action/Plan:   Expected Discharge Date:                  Expected Discharge Plan:     In-House Referral:     Discharge planning Services  CM Consult  Post Acute Care Choice:  Home Health Choice offered to:  Patient  DME Arranged:    DME Agency:     HH Arranged:    [place] Agency:  [facility]  Status of Service:  In process, will continue to follow  Medicare Important Message Given:    Date Medicare IM Given:    Medicare IM give by:    Date Additional Medicare IM Given:    Additional Medicare Important Message give by:     If discussed at [place] of Stay Meetings, dates discussed:    Additional Comments:  Aidian Salomon A, RN 05/14/2015, 1:19 PM

## 2015-05-16 DIAGNOSIS — L97422 Non-pressure chronic ulcer of left heel and midfoot with fat layer exposed: Secondary | ICD-10-CM | POA: Diagnosis not present

## 2015-05-17 DIAGNOSIS — G8929 Other chronic pain: Secondary | ICD-10-CM | POA: Diagnosis not present

## 2015-05-17 DIAGNOSIS — F988 Other specified behavioral and emotional disorders with onset usually occurring in childhood and adolescence: Secondary | ICD-10-CM | POA: Diagnosis not present

## 2015-05-17 DIAGNOSIS — J449 Chronic obstructive pulmonary disease, unspecified: Secondary | ICD-10-CM | POA: Diagnosis not present

## 2015-05-17 DIAGNOSIS — F319 Bipolar disorder, unspecified: Secondary | ICD-10-CM | POA: Diagnosis not present

## 2015-05-17 DIAGNOSIS — L97422 Non-pressure chronic ulcer of left heel and midfoot with fat layer exposed: Secondary | ICD-10-CM | POA: Diagnosis not present

## 2015-05-17 DIAGNOSIS — M1990 Unspecified osteoarthritis, unspecified site: Secondary | ICD-10-CM | POA: Diagnosis not present

## 2015-05-18 DIAGNOSIS — F3189 Other bipolar disorder: Secondary | ICD-10-CM | POA: Diagnosis not present

## 2015-05-18 DIAGNOSIS — M1990 Unspecified osteoarthritis, unspecified site: Secondary | ICD-10-CM | POA: Diagnosis not present

## 2015-05-18 DIAGNOSIS — L97422 Non-pressure chronic ulcer of left heel and midfoot with fat layer exposed: Secondary | ICD-10-CM | POA: Diagnosis not present

## 2015-05-18 DIAGNOSIS — G8929 Other chronic pain: Secondary | ICD-10-CM | POA: Diagnosis not present

## 2015-05-18 DIAGNOSIS — J449 Chronic obstructive pulmonary disease, unspecified: Secondary | ICD-10-CM | POA: Diagnosis not present

## 2015-05-18 DIAGNOSIS — F988 Other specified behavioral and emotional disorders with onset usually occurring in childhood and adolescence: Secondary | ICD-10-CM | POA: Diagnosis not present

## 2015-05-18 DIAGNOSIS — F319 Bipolar disorder, unspecified: Secondary | ICD-10-CM | POA: Diagnosis not present

## 2015-05-18 LAB — CULTURE, BLOOD (ROUTINE X 2)
Culture: NO GROWTH
Culture: NO GROWTH

## 2015-05-19 ENCOUNTER — Telehealth: Payer: Self-pay

## 2015-05-19 ENCOUNTER — Other Ambulatory Visit: Payer: Self-pay

## 2015-05-19 DIAGNOSIS — G894 Chronic pain syndrome: Secondary | ICD-10-CM

## 2015-05-19 DIAGNOSIS — F3189 Other bipolar disorder: Secondary | ICD-10-CM | POA: Diagnosis not present

## 2015-05-19 DIAGNOSIS — L97522 Non-pressure chronic ulcer of other part of left foot with fat layer exposed: Secondary | ICD-10-CM

## 2015-05-19 NOTE — Telephone Encounter (Signed)
pt was called and told that seroquel was called into [facility] and that the lexapro and xanax was sent to the [facility] that she can call an have transfered.

## 2015-05-19 NOTE — Telephone Encounter (Signed)
called dr. Einar Grad and read the note to her and explained what was going on.  I did get the ok for seroquel to be called in

## 2015-05-19 NOTE — Telephone Encounter (Signed)
spoke with patient she was ok until i told her who i was and where i was calling from.  patient started crying.  patient stated that no one let her know that  Dr. Jimmye Norman had left.  pt was told that was because she did not show up for her appt.  then patient states that the new doctor didn't ask her how she was doing she just changed her medications and she needs her medication.  Pt was told that her xanax was decreased from 4 to 2 and that the lexapro and the new rx for xanax was sent to [facility] and that i would have to call dr. Einar Grad and get the ok for the seroquel to be called in.

## 2015-05-19 NOTE — Telephone Encounter (Signed)
per the nurse kim Ricci Barker at [facility] clinic podiatry pt called crying and stating that the doctor cut her medication and would not refill her medication and that when she called over her she ask to speak to nurse or the doctor and she states that we refused to let her.  Per Virl Axe at [facility] something needs to be done because she is crying and she couldn't have way understand patient.   (kim was told that we would never tell a patient that they couldn't talk to a nurse and that i would call patient back and see what was going on )

## 2015-05-19 NOTE — Telephone Encounter (Signed)
Patient called states she has a foot ulcer has already seen podiatry but since Dr. Nadine Counts was giving tramadol he did not put her on anything else.  She needs a refill is already out due to pain.

## 2015-05-19 NOTE — Telephone Encounter (Signed)
called to find out when seroquel was last filled per the pharmacy pt is due for another refill pt last received february .  a verbal rx was given over the phone per dr. Einar Grad ok

## 2015-05-23 DIAGNOSIS — F3189 Other bipolar disorder: Secondary | ICD-10-CM | POA: Diagnosis not present

## 2015-05-24 ENCOUNTER — Telehealth: Payer: Self-pay | Admitting: Family Medicine

## 2015-05-24 DIAGNOSIS — G8929 Other chronic pain: Secondary | ICD-10-CM | POA: Diagnosis not present

## 2015-05-24 DIAGNOSIS — M1990 Unspecified osteoarthritis, unspecified site: Secondary | ICD-10-CM | POA: Diagnosis not present

## 2015-05-24 DIAGNOSIS — F988 Other specified behavioral and emotional disorders with onset usually occurring in childhood and adolescence: Secondary | ICD-10-CM | POA: Diagnosis not present

## 2015-05-24 DIAGNOSIS — F319 Bipolar disorder, unspecified: Secondary | ICD-10-CM | POA: Diagnosis not present

## 2015-05-24 DIAGNOSIS — J449 Chronic obstructive pulmonary disease, unspecified: Secondary | ICD-10-CM | POA: Diagnosis not present

## 2015-05-24 DIAGNOSIS — L97422 Non-pressure chronic ulcer of left heel and midfoot with fat layer exposed: Secondary | ICD-10-CM | POA: Diagnosis not present

## 2015-05-24 DIAGNOSIS — F3189 Other bipolar disorder: Secondary | ICD-10-CM | POA: Diagnosis not present

## 2015-05-24 NOTE — Telephone Encounter (Signed)
Patient is requesting something for yeast infection. States that it is itching and burning along with a little white discharge. States that she just finished antibiotic last night for her foot. Also patient is requesting a prescription for women underwear because (thin ones) because patient has began to wet herself. [facility] send to [facility].

## 2015-05-25 DIAGNOSIS — F3189 Other bipolar disorder: Secondary | ICD-10-CM | POA: Diagnosis not present

## 2015-05-25 MED ORDER — FLUCONAZOLE 150 MG PO TABS: 150.0000 mg | ORAL_TABLET | ORAL | Status: DC

## 2015-05-25 NOTE — Telephone Encounter (Signed)
Sent medication for yeast infection, however she will have to be evaluated for incontinence before we can write prescriptions for that.

## 2015-05-26 ENCOUNTER — Other Ambulatory Visit: Payer: Self-pay

## 2015-05-26 DIAGNOSIS — J449 Chronic obstructive pulmonary disease, unspecified: Secondary | ICD-10-CM | POA: Diagnosis not present

## 2015-05-26 DIAGNOSIS — F988 Other specified behavioral and emotional disorders with onset usually occurring in childhood and adolescence: Secondary | ICD-10-CM | POA: Diagnosis not present

## 2015-05-26 DIAGNOSIS — G8929 Other chronic pain: Secondary | ICD-10-CM | POA: Diagnosis not present

## 2015-05-26 DIAGNOSIS — F3189 Other bipolar disorder: Secondary | ICD-10-CM | POA: Diagnosis not present

## 2015-05-26 DIAGNOSIS — F319 Bipolar disorder, unspecified: Secondary | ICD-10-CM | POA: Diagnosis not present

## 2015-05-26 DIAGNOSIS — L97422 Non-pressure chronic ulcer of left heel and midfoot with fat layer exposed: Secondary | ICD-10-CM | POA: Diagnosis not present

## 2015-05-26 DIAGNOSIS — M1990 Unspecified osteoarthritis, unspecified site: Secondary | ICD-10-CM | POA: Diagnosis not present

## 2015-05-27 DIAGNOSIS — F3189 Other bipolar disorder: Secondary | ICD-10-CM | POA: Diagnosis not present

## 2015-05-27 NOTE — Progress Notes (Signed)
This encounter was created in error - please disregard.

## 2015-05-30 ENCOUNTER — Ambulatory Visit (INDEPENDENT_AMBULATORY_CARE_PROVIDER_SITE_OTHER): Payer: Commercial Managed Care - HMO | Admitting: Psychiatry

## 2015-05-30 ENCOUNTER — Encounter: Payer: Self-pay | Admitting: Psychiatry

## 2015-05-30 VITALS — BP 126/78 | HR 109 | Temp 98.7°F | Ht 61.0 in | Wt 249.4 lb

## 2015-05-30 DIAGNOSIS — F4310 Post-traumatic stress disorder, unspecified: Secondary | ICD-10-CM

## 2015-05-30 DIAGNOSIS — F3130 Bipolar disorder, current episode depressed, mild or moderate severity, unspecified: Secondary | ICD-10-CM

## 2015-05-30 MED ORDER — DIVALPROEX SODIUM 250 MG PO DR TAB: 250.0000 mg | DELAYED_RELEASE_TABLET | Freq: Two times a day (BID) | ORAL | Status: DC

## 2015-05-30 MED ORDER — ESCITALOPRAM OXALATE 20 MG PO TABS: 20.0000 mg | ORAL_TABLET | ORAL | Status: DC

## 2015-05-30 MED ORDER — TRAZODONE HCL 100 MG PO TABS: 100.0000 mg | ORAL_TABLET | Freq: Every day | ORAL | Status: DC

## 2015-05-30 MED ORDER — ALPRAZOLAM 0.5 MG PO TABS: 0.5000 mg | ORAL_TABLET | Freq: Three times a day (TID) | ORAL | Status: DC

## 2015-05-30 MED ORDER — QUETIAPINE FUMARATE ER 300 MG PO TB24: 300.0000 mg | ORAL_TABLET | Freq: Every day | ORAL | Status: DC

## 2015-05-30 NOTE — Progress Notes (Signed)
Patient ID: Nicole Austin, female   DOB: [birth year], 55 y.o.   MRN: [id number] [facility] MD/PA/NP OP Progress Note  05/30/2015 10:00 AM Nicole Austin  MRN:  [id number]  Subjective:  Patient is a [name] with history of bipolar disorder and posttraumatic stress disorder who was seen for follow-up. She was sad and tearful during the interview as she reported that her medications were adjusted at the last appointment. She reported that she was following Dr. Jimmye Norman for long period Of time and her providers were changed without notification. She reported that she is going through a lot and has been feeling depressed and anxious due to the death of her mother. She reported that she does not do well with the provider change. She reported that she wants to have her medications adjusted during this appointment. She brought her bottles of medications and we discussed in detail about them. She currently denied having any side effects and reported that she does not know the reason why she was prescribed trazodone at her last appointment. She has not been taking it as prescribed. She currently denied having any suicidal homicidal ideations or plans. Patient reported that her foot is infected and she is having pain in her legs and has been prescribed Lyrica due to the same reason. She currently lives by herself.   She is compliant with her medications. She is not having any thoughts to hurt herself.    Chief Complaint: pain Chief Complaint    Follow-up; Medication Refill; Anxiety; Depression; Panic Attack; Insomnia     Visit Diagnosis:     ICD-9-CM ICD-10-CM   1. PTSD (post-traumatic stress disorder) 309.81 F43.10   2. Bipolar I disorder, most recent episode depressed ([place]) 296.50 F31.30     Past Medical History:  Past Medical History  Diagnosis Date  . COPD (chronic obstructive pulmonary disease) ([place])   . PTSD (post-traumatic stress disorder)   . Rhabdomyolysis   . Bipolar 1  disorder ([place])   . Arthritis   . Stroke ([place])   . MI (myocardial infarction) ([place])   . Depression   . ADHD (attention deficit hyperactivity disorder)   . Asthma     Past Surgical History  Procedure Laterality Date  . Replacement total knee Left    Family History:  Family History  Problem Relation Age of Onset  . Hernia Mother   . Heart disease Mother   . OCD Mother   . Diabetes Mother   . Parkinson's disease Father   . Bipolar disorder Sister   . Schizophrenia Sister   . ADD / ADHD Sister   . Alcohol abuse Brother   . Bipolar disorder Sister   . Paranoid behavior Sister   . ADD / ADHD Sister    Social History:  Social History   Social History  . Marital Status: Divorced    Spouse Name: N/A  . Number of Children: N/A  . Years of Education: N/A   Social History Main Topics  . Smoking status: Current Every Day Smoker -- 0.50 packs/day    Types: Cigarettes    Start date: 10/11/1984  . Smokeless tobacco: Never Used  . Alcohol Use: No  . Drug Use: No  . Sexual Activity: No   Other Topics Concern  . None   Social History Narrative   Additional History:   Assessment:   Musculoskeletal: Strength & Muscle Tone: within normal limits Gait & Station: limping Patient leans: N/A  Psychiatric Specialty Exam: Anxiety Symptoms include  insomnia and nervous/anxious behavior. Patient reports no suicidal ideas.    Depression        Associated symptoms include insomnia.  Associated symptoms include no suicidal ideas.  Past medical history includes anxiety.   Insomnia PMH includes: depression.    Review of Systems  Psychiatric/Behavioral: Positive for depression. Negative for suicidal ideas, hallucinations, memory loss and substance abuse. The patient is nervous/anxious and has insomnia.     Blood pressure 126/78, pulse 109, temperature 98.7 F (37.1 C), temperature source Tympanic, height 5\' 1"  (1.549 m), weight 249 lb 6.4 oz (113.127 kg), SpO2 89 %.Body mass index  is 47.15 kg/(m^2).  General Appearance: Disheveled   Eye Contact:  Good  Speech:  Clear and Coherent and Normal Rate  Volume:  Normal  Mood:  Anxious and Depressed  Affect:  Constricted and tearful   Thought Process:  Linear  Orientation:  Full (Time, Place, and Person)  Thought Content:  Negative  Suicidal Thoughts:  No  Homicidal Thoughts:  No  Memory:  Immediate;   Good Recent;   Good Remote;   Good  Judgement:  Good  Insight:  Good  Psychomotor Activity:  Negative  Concentration:  Good  Recall:  Good  Fund of Knowledge: Good  Language: Good  Akathisia:  Negative  Handed:  Right unknown  AIMS (if indicated):  Not done  Assets:  Communication Skills Housing Social Support  ADL's:  Intact  Cognition: WNL  Sleep:  Good   Is the patient at risk to self?  No. Has the patient been a risk to self in the past 6 months?  No. Has the patient been a risk to self within the distant past?  Yes.   Is the patient a risk to others?  No. Has the patient been a risk to others in the past 6 months?  No. Has the patient been a risk to others within the distant past?  No.  Current Medications: Current Outpatient Prescriptions  Medication Sig Dispense Refill  . albuterol (PROVENTIL HFA;VENTOLIN HFA) 108 (90 Base) MCG/ACT inhaler Inhale 1-2 puffs into the lungs every 6 (six) hours as needed for wheezing or shortness of breath. 6.7 g 1  . ALPRAZolam (XANAX) 1 MG tablet Take 1 tablet (1 mg total) by mouth 2 (two) times daily as needed for anxiety. 45 tablet 2  . aspirin EC 81 MG tablet Take 1 tablet (81 mg total) by mouth daily.    Marland Kitchen atorvastatin (LIPITOR) 40 MG tablet Take 1 tablet (40 mg total) by mouth at bedtime. 30 tablet 5  . collagenase (SANTYL) ointment Apply topically daily. 15 g 0  . cyclobenzaprine (FLEXERIL) 5 MG tablet Take 1 tablet (5 mg total) by mouth 3 (three) times daily as needed for muscle spasms. 90 tablet 2  . divalproex (DEPAKOTE) 250 MG DR tablet Take 1 tablet (250  mg total) by mouth 2 (two) times daily. 60 tablet 1  . escitalopram (LEXAPRO) 20 MG tablet Take 1 tablet (20 mg total) by mouth every other day. 30 tablet 2  . fluconazole (DIFLUCAN) 150 MG tablet Take 1 tablet (150 mg total) by mouth every other day. 3 tablet 0  . glucosamine-chondroitin 500-400 MG tablet Take 1 tablet by mouth 2 (two) times daily.    Marland Kitchen ipratropium-albuterol (DUONEB) 0.5-2.5 (3) MG/3ML SOLN Take 3 mLs by nebulization every 4 (four) hours as needed. (Patient taking differently: Take 3 mLs by nebulization every 4 (four) hours as needed (for wheezing). ) 360 mL 6  .  lidocaine (XYLOCAINE) 5 % ointment Apply 1 application topically as needed for mild pain.    Marland Kitchen LYRICA 200 MG capsule Take 1 capsule (200 mg total) by mouth 2 (two) times daily. 60 capsule 5  . Multiple Vitamin (MULTIVITAMIN WITH MINERALS) TABS tablet Take 1 tablet by mouth daily.    . naproxen (NAPROSYN) 500 MG tablet Take 1 tablet (500 mg total) by mouth 2 (two) times daily with a meal. 30 tablet 0  . QUEtiapine (SEROQUEL XR) 300 MG 24 hr tablet Take 300 mg by mouth at bedtime.    . traMADol (ULTRAM) 50 MG tablet Take 1 tablet (50 mg total) by mouth every 12 (twelve) hours as needed. 60 tablet 0   No current facility-administered medications for this visit.    Medical Decision Making:  Established Problem, Stable/Improving (1) and Review or order clinical lab tests (1)  Treatment Plan Summary:Medication management    Bipolar disorder-continue Seroquel XR at 300 mg at bedtime.   Discussed with patient about her Xanax dose and I will decrease it to 0.5 mg by mouth 3 times a day and patient agreed with the plan.   Continue her Lexapro 20 mg a day. She is on Depakote 250 mg twice a day medication refills.   PTSD-stable. Will continue the Lexapro and Seroquel.  Patient will follow up in a  months. She's been encouraged call with any questions or concerns prior to her next appointment.  Time spent was 30  minutes   More than 50% of the time spent in psychoeducation, counseling and coordination of care.    This note was generated in part or whole with voice recognition software. Voice regonition is usually quite accurate but there are transcription errors that can and very often do occur. I apologize for any typographical errors that were not detected and corrected.    Rainey Pines, MD  05/30/2015, 10:00 AM

## 2015-05-31 ENCOUNTER — Emergency Department
Admission: EM | Admit: 2015-05-31 | Discharge: 2015-05-31 | Disposition: A | Discharge status: Home / Self Care | Payer: Commercial Managed Care - HMO | Attending: Emergency Medicine | Admitting: Emergency Medicine

## 2015-05-31 ENCOUNTER — Ambulatory Visit: Payer: Self-pay | Admitting: Family Medicine

## 2015-05-31 ENCOUNTER — Emergency Department: Payer: Commercial Managed Care - HMO

## 2015-05-31 DIAGNOSIS — I639 Cerebral infarction, unspecified: Secondary | ICD-10-CM | POA: Insufficient documentation

## 2015-05-31 DIAGNOSIS — B349 Viral infection, unspecified: Secondary | ICD-10-CM | POA: Diagnosis not present

## 2015-05-31 DIAGNOSIS — Z79899 Other long term (current) drug therapy: Secondary | ICD-10-CM | POA: Diagnosis not present

## 2015-05-31 DIAGNOSIS — F17210 Nicotine dependence, cigarettes, uncomplicated: Secondary | ICD-10-CM | POA: Insufficient documentation

## 2015-05-31 DIAGNOSIS — F319 Bipolar disorder, unspecified: Secondary | ICD-10-CM | POA: Insufficient documentation

## 2015-05-31 DIAGNOSIS — Z7982 Long term (current) use of aspirin: Secondary | ICD-10-CM | POA: Insufficient documentation

## 2015-05-31 DIAGNOSIS — J441 Chronic obstructive pulmonary disease with (acute) exacerbation: Secondary | ICD-10-CM | POA: Diagnosis not present

## 2015-05-31 DIAGNOSIS — R531 Weakness: Secondary | ICD-10-CM | POA: Diagnosis present

## 2015-05-31 DIAGNOSIS — J45909 Unspecified asthma, uncomplicated: Secondary | ICD-10-CM | POA: Insufficient documentation

## 2015-05-31 DIAGNOSIS — I252 Old myocardial infarction: Secondary | ICD-10-CM | POA: Diagnosis not present

## 2015-05-31 DIAGNOSIS — M6281 Muscle weakness (generalized): Secondary | ICD-10-CM | POA: Diagnosis not present

## 2015-05-31 DIAGNOSIS — R05 Cough: Secondary | ICD-10-CM | POA: Diagnosis not present

## 2015-05-31 LAB — COMPREHENSIVE METABOLIC PANEL
ALBUMIN: 3.4 g/dL — AB (ref 3.5–5.0)
ALT: 10 U/L — AB (ref 14–54)
AST: 23 U/L (ref 15–41)
Alkaline Phosphatase: 100 U/L (ref 38–126)
Anion gap: 6 (ref 5–15)
BUN: 11 mg/dL (ref 6–20)
CHLORIDE: 103 mmol/L (ref 101–111)
CO2: 27 mmol/L (ref 22–32)
CREATININE: 0.96 mg/dL (ref 0.44–1.00)
Calcium: 9.1 mg/dL (ref 8.9–10.3)
GFR calc Af Amer: >60 mL/min (ref >60)
GLUCOSE: 89 mg/dL (ref 65–99)
POTASSIUM: 4.1 mmol/L (ref 3.5–5.1)
Sodium: 136 mmol/L (ref 135–145)
Total Bilirubin: 0.6 mg/dL (ref 0.3–1.2)
Total Protein: 7.3 g/dL (ref 6.5–8.1)

## 2015-05-31 LAB — CBC WITH DIFFERENTIAL/PLATELET
Basophils Absolute: 0 10*3/uL (ref 0–0.1)
Basophils Relative: 1 %
EOS PCT: 14 %
Eosinophils Absolute: 1.1 10*3/uL — ABNORMAL HIGH (ref 0–0.7)
HCT: 41.3 % (ref 35.0–47.0)
Hemoglobin: 13.7 g/dL (ref 12.0–16.0)
LYMPHS ABS: 2.1 10*3/uL (ref 1.0–3.6)
LYMPHS PCT: 29 %
MCH: 29.1 pg (ref 26.0–34.0)
MCHC: 33 g/dL (ref 32.0–36.0)
MCV: 88 fL (ref 80.0–100.0)
MONO ABS: 0.5 10*3/uL (ref 0.2–0.9)
MONOS PCT: 7 %
Neutro Abs: 3.6 10*3/uL (ref 1.4–6.5)
Neutrophils Relative %: 49 %
PLATELETS: 164 10*3/uL (ref 150–440)
RBC: 4.7 MIL/uL (ref 3.80–5.20)
RDW: 16 % — ABNORMAL HIGH (ref 11.5–14.5)
WBC: 7.4 10*3/uL (ref 3.6–11.0)

## 2015-05-31 LAB — URINALYSIS COMPLETE WITH MICROSCOPIC (ARMC ONLY)
Bilirubin Urine: NEGATIVE
Glucose, UA: NEGATIVE mg/dL
HGB URINE DIPSTICK: NEGATIVE
Ketones, ur: NEGATIVE mg/dL
LEUKOCYTES UA: NEGATIVE
Nitrite: NEGATIVE
PH: 8 (ref 5.0–8.0)
PROTEIN: NEGATIVE mg/dL
Specific Gravity, Urine: 1.008 (ref 1.005–1.030)

## 2015-05-31 LAB — VALPROIC ACID LEVEL: Valproic Acid Lvl: 29 ug/mL — ABNORMAL LOW (ref 50.0–100.0)

## 2015-05-31 LAB — TROPONIN I: Troponin I: <0.03 ng/mL (ref <0.031)

## 2015-05-31 MED ORDER — IPRATROPIUM-ALBUTEROL 0.5-2.5 (3) MG/3ML IN SOLN
9.0000 mL | Freq: Once | RESPIRATORY_TRACT | Status: AC
  Administered 2015-05-31: 9 mL via RESPIRATORY_TRACT
  Filled 2015-05-31: qty 9

## 2015-05-31 MED ORDER — PREDNISONE 20 MG PO TABS: 40.0000 mg | ORAL_TABLET | Freq: Every day | ORAL | Status: DC

## 2015-05-31 MED ORDER — AZITHROMYCIN 500 MG PO TABS: 500.0000 mg | ORAL_TABLET | Freq: Once | ORAL | Status: DC

## 2015-05-31 MED ORDER — DOXYCYCLINE HYCLATE 100 MG PO TABS
100.0000 mg | ORAL_TABLET | Freq: Once | ORAL | Status: AC
  Administered 2015-05-31: 100 mg via ORAL
  Filled 2015-05-31: qty 1

## 2015-05-31 MED ORDER — DOXYCYCLINE HYCLATE 100 MG PO TABS: 100.0000 mg | ORAL_TABLET | Freq: Two times a day (BID) | ORAL | Status: DC

## 2015-05-31 MED ORDER — ALBUTEROL SULFATE HFA 108 (90 BASE) MCG/ACT IN AERS: 2.0000 | INHALATION_SPRAY | Freq: Four times a day (QID) | RESPIRATORY_TRACT | Status: DC | PRN

## 2015-05-31 MED ORDER — AZITHROMYCIN 250 MG PO TABS: ORAL_TABLET | ORAL | Status: DC

## 2015-05-31 MED ORDER — SODIUM CHLORIDE 0.9 % IV BOLUS (SEPSIS)
1000.0000 mL | Freq: Once | INTRAVENOUS | Status: AC
  Administered 2015-05-31: 1000 mL via INTRAVENOUS

## 2015-05-31 MED ORDER — METHYLPREDNISOLONE SODIUM SUCC 125 MG IJ SOLR
125.0000 mg | Freq: Once | INTRAMUSCULAR | Status: AC
  Administered 2015-05-31: 125 mg via INTRAVENOUS
  Filled 2015-05-31: qty 2

## 2015-05-31 MED ORDER — ONDANSETRON HCL 4 MG/2ML IJ SOLN
4.0000 mg | Freq: Once | INTRAMUSCULAR | Status: AC
  Administered 2015-05-31: 4 mg via INTRAVENOUS
  Filled 2015-05-31: qty 2

## 2015-05-31 NOTE — ED Provider Notes (Addendum)
[facility] Emergency Department Provider Note  ____________________________________________  Time seen: Seen upon arrival to the emergency department  I have reviewed the triage vital signs and the nursing notes.   HISTORY  Chief Complaint Weakness    HPI Nicole Austin is a 55 y.o. female with a history of COPD as well as bipolar disorder who is presenting to the emergency department today with weakness. Patient has been weak and aching over the past week and has not "been her normal self" per her caretaker. Patient is complaining of diarrhea, multiple episodes per day, over the past week. Says she is now wearing a diaper because she says she wakes up with diarrhea in the mornings. She denies any recent antibiotics. Also says that she has a cough which is nonproductive. Denies fever home. Initial blood pressure by EMS was hypotensive but has been normotensive here initially.Patient also reports mild nausea but no vomiting.   Past Medical History  Diagnosis Date  . COPD (chronic obstructive pulmonary disease) ([place])   . PTSD (post-traumatic stress disorder)   . Rhabdomyolysis   . Bipolar 1 disorder ([place])   . Arthritis   . Stroke ([place])   . MI (myocardial infarction) ([place])   . Depression   . ADHD (attention deficit hyperactivity disorder)   . Asthma     Patient Active Problem List   Diagnosis Date Noted  . Cellulitis 05/13/2015  . HLD (hyperlipidemia) 05/13/2015  . Foot ulcer, left ([place]) 04/27/2015  . Hyperlipidemia LDL goal <100 04/27/2015  . Chronic pain disorder 04/27/2015  . Stroke ([place]) 03/25/2015  . Bipolar I disorder, most recent episode depressed ([place]) 12/22/2014  . Morbid obesity ([place]) 12/22/2014  . Anxiety 08/16/2014  . Affective bipolar disorder ([place]) 08/16/2014  . Gout 08/16/2014  . Extreme obesity ([place]) 08/16/2014  . Muscle spasms of both lower extremities 08/16/2014  . Back pain, chronic 08/13/2014  . Low back pain with  sciatica 08/04/2014  . Compulsive tobacco user syndrome 08/04/2014  . Current tobacco use 08/04/2014  . Polysubstance abuse 07/04/2014  . Anxiety, generalized 11/24/2013  . Imbalance 11/24/2013  . Blurred vision 11/24/2013  . Cephalalgia 11/24/2013  . COPD, moderate ([place]) 11/18/2013  . Moderate COPD (chronic obstructive pulmonary disease) ([place]) 11/18/2013  . HPV (human papilloma virus) infection 07/17/2013  . Arthritis of knee, degenerative 07/15/2013  . H/O neoplasm 07/31/2011    Past Surgical History  Procedure Laterality Date  . Replacement total knee Left     Current Outpatient Rx  Name  Route  Sig  Dispense  Refill  . albuterol (PROVENTIL HFA;VENTOLIN HFA) 108 (90 Base) MCG/ACT inhaler   Inhalation   Inhale 1-2 puffs into the lungs every 6 (six) hours as needed for wheezing or shortness of breath.   6.7 g   1   . ALPRAZolam (XANAX) 0.5 MG tablet   Oral   Take 1 tablet (0.5 mg total) by mouth 3 (three) times daily.   90 tablet   0   . aspirin EC 81 MG tablet   Oral   Take 1 tablet (81 mg total) by mouth daily.         Marland Kitchen atorvastatin (LIPITOR) 40 MG tablet   Oral   Take 1 tablet (40 mg total) by mouth at bedtime.   30 tablet   5   . collagenase (SANTYL) ointment   Topical   Apply topically daily.   15 g   0   . cyclobenzaprine (FLEXERIL) 5 MG tablet  Oral   Take 1 tablet (5 mg total) by mouth 3 (three) times daily as needed for muscle spasms.   90 tablet   2   . divalproex (DEPAKOTE) 250 MG DR tablet   Oral   Take 1 tablet (250 mg total) by mouth 2 (two) times daily.   60 tablet   1   . escitalopram (LEXAPRO) 20 MG tablet   Oral   Take 1 tablet (20 mg total) by mouth every other day.   30 tablet   2   . fluconazole (DIFLUCAN) 150 MG tablet   Oral   Take 1 tablet (150 mg total) by mouth every other day.   3 tablet   0   . glucosamine-chondroitin 500-400 MG tablet   Oral   Take 1 tablet by mouth 2 (two) times daily.         Marland Kitchen  ipratropium-albuterol (DUONEB) 0.5-2.5 (3) MG/3ML SOLN   Nebulization   Take 3 mLs by nebulization every 4 (four) hours as needed. Patient taking differently: Take 3 mLs by nebulization every 4 (four) hours as needed (for wheezing).    360 mL   6   . lidocaine (XYLOCAINE) 5 % ointment   Topical   Apply 1 application topically as needed for mild pain.         Marland Kitchen LYRICA 200 MG capsule   Oral   Take 1 capsule (200 mg total) by mouth 2 (two) times daily.   60 capsule   5     Dispense as written.   . Multiple Vitamin (MULTIVITAMIN WITH MINERALS) TABS tablet   Oral   Take 1 tablet by mouth daily.         . naproxen (NAPROSYN) 500 MG tablet   Oral   Take 1 tablet (500 mg total) by mouth 2 (two) times daily with a meal.   30 tablet   0   . QUEtiapine (SEROQUEL XR) 300 MG 24 hr tablet   Oral   Take 1 tablet (300 mg total) by mouth at bedtime.   30 tablet   1   . traMADol (ULTRAM) 50 MG tablet   Oral   Take 1 tablet (50 mg total) by mouth every 12 (twelve) hours as needed.   60 tablet   0     Refill 04/27/15   . traZODone (DESYREL) 100 MG tablet   Oral   Take 1 tablet (100 mg total) by mouth at bedtime.   30 tablet   0     Pt has supply     Allergies Codeine  Family History  Problem Relation Age of Onset  . Hernia Mother   . Heart disease Mother   . OCD Mother   . Diabetes Mother   . Parkinson's disease Father   . Bipolar disorder Sister   . Schizophrenia Sister   . ADD / ADHD Sister   . Alcohol abuse Brother   . Bipolar disorder Sister   . Paranoid behavior Sister   . ADD / ADHD Sister     Social History Social History  Substance Use Topics  . Smoking status: Current Every Day Smoker -- 0.50 packs/day    Types: Cigarettes    Start date: 10/11/1984  . Smokeless tobacco: Never Used  . Alcohol Use: No    Review of Systems Constitutional: No fever/chills Eyes: No visual changes. ENT: No sore throat. Cardiovascular: Denies chest  pain. Respiratory: Denies shortness of breath. Gastrointestinal: No abdominal pain.  no vomiting.  No diarrhea.  No constipation. Genitourinary: Negative for dysuria. Musculoskeletal: Negative for back pain. Skin: Negative for rash. Neurological: Negative for headaches, focal weakness or numbness.  10-point ROS otherwise negative.  ____________________________________________   PHYSICAL EXAM:  VITAL SIGNS: ED Triage Vitals  Enc Vitals Group     BP 05/31/15 1635 122/76 mmHg     Pulse Rate 05/31/15 1635 95     Resp 05/31/15 1635 18     Temp 05/31/15 1635 98 F (36.7 C)     Temp Source 05/31/15 1635 Oral     SpO2 05/31/15 1632 93 %     Weight 05/31/15 1635 249 lb (112.946 kg)     Height 05/31/15 1635 5\' 1"  (1.549 m)     Head Cir --      Peak Flow --      Pain Score 05/31/15 1636 10     Pain Loc --      Pain Edu? --      Excl. in [place]? --     Constitutional: Alert and oriented. Well appearing and in no acute distress. Eyes: Conjunctivae are normal. PERRL. EOMI. Head: Atraumatic. Nose: No congestion/rhinnorhea. Mouth/Throat: Mucous membranes are moist.   Neck: No stridor.   Cardiovascular: Normal rate, regular rhythm. Grossly normal heart sounds.  Good peripheral circulation. Respiratory: Normal respiratory effort.  No retractions. Lungs CTAB With a mildly prolonged for a phase. Gastrointestinal: Soft with mild diffuse tenderness palpation. No distention.  No CVA tenderness. Musculoskeletal: No lower extremity tenderness nor edema.  No joint effusions. Neurologic:  Normal speech and language. No gross focal neurologic deficits are appreciated.  Skin:  Skin is warm, dry and intact. No rash noted. Psychiatric: Mood and affect are normal. Speech and behavior are normal.  ____________________________________________   LABS (all labs ordered are listed, but only abnormal results are displayed)  Labs Reviewed  CBC WITH DIFFERENTIAL/PLATELET - Abnormal; Notable for the  following:    RDW 16.0 (*)    Eosinophils Absolute 1.1 (*)    All other components within normal limits  COMPREHENSIVE METABOLIC PANEL - Abnormal; Notable for the following:    Albumin 3.4 (*)    ALT 10 (*)    All other components within normal limits  VALPROIC ACID LEVEL - Abnormal; Notable for the following:    Valproic Acid Lvl 29 (*)    All other components within normal limits  URINALYSIS COMPLETEWITH MICROSCOPIC ([facility] ONLY) - Abnormal; Notable for the following:    Color, Urine STRAW (*)    APPearance CLEAR (*)    Bacteria, UA RARE (*)    Squamous Epithelial / LPF 0-5 (*)    All other components within normal limits  C DIFFICILE QUICK SCREEN W PCR REFLEX  TROPONIN I   ____________________________________________  EKG  ED ECG REPORT I, Doran Stabler, the attending physician, personally viewed and interpreted this ECG.   Date: 05/31/2015  EKG Time: 1641  Rate: 96  Rhythm: normal sinus rhythm  Axis: Normal  Intervals:none  ST&T Change: No ST segment elevation or depression. No abnormal T-wave inversions.  ____________________________________________  RADIOLOGY  No active cardiopulmonary disease on the chest x-ray. ____________________________________________   PROCEDURES  ____________________________________________   INITIAL IMPRESSION / ASSESSMENT AND PLAN / ED COURSE  Pertinent labs & imaging results that were available during my care of the patient were reviewed by me and considered in my medical decision making (see chart for details).  ----------------------------------------- 9:25 PM on 05/31/2015 -----------------------------------------  Patient lungs are now  clear and no longer has a prolonged history phase. We'll discharge with antibiotics as well as steroids. Patient also says that she is supposed be on 1 L nasal cannula at home and has the equipment but does not wear it currently. Says she will begin wearing her at home oxygen.  Understands plan and willing to comply. Respiratory rate upon my reevaluation is 18 breaths for minute. ____________________________________________   FINAL CLINICAL IMPRESSION(S) / ED DIAGNOSES  Viral illness. COPD exacerbation.    Orbie Pyo, MD 05/31/15 2126  Correction to above. Left heel with small area of scabbing about 1 x 3 cm. No surrounding induration, pus or erythema.  Orbie Pyo, MD 05/31/15 2128  Patient also without any episodes of diarrhea in the emergency department. Unlikely to be C. difficile.  Orbie Pyo, MD 05/31/15 2135

## 2015-05-31 NOTE — ED Notes (Signed)
Pt arrived from home via EMS c/o weakness. Per EMS pt's care giver called and stated she has been weak, aching, and not getting out of bed and moving around as her normal self. Pt alert and oriented c/o diarrhea, cough, aching, in no acute distress at this time.

## 2015-06-02 DIAGNOSIS — J449 Chronic obstructive pulmonary disease, unspecified: Secondary | ICD-10-CM | POA: Diagnosis not present

## 2015-06-02 DIAGNOSIS — M1990 Unspecified osteoarthritis, unspecified site: Secondary | ICD-10-CM | POA: Diagnosis not present

## 2015-06-02 DIAGNOSIS — G8929 Other chronic pain: Secondary | ICD-10-CM | POA: Diagnosis not present

## 2015-06-02 DIAGNOSIS — F319 Bipolar disorder, unspecified: Secondary | ICD-10-CM | POA: Diagnosis not present

## 2015-06-02 DIAGNOSIS — L97422 Non-pressure chronic ulcer of left heel and midfoot with fat layer exposed: Secondary | ICD-10-CM | POA: Diagnosis not present

## 2015-06-02 DIAGNOSIS — F988 Other specified behavioral and emotional disorders with onset usually occurring in childhood and adolescence: Secondary | ICD-10-CM | POA: Diagnosis not present

## 2015-06-06 DIAGNOSIS — J449 Chronic obstructive pulmonary disease, unspecified: Secondary | ICD-10-CM | POA: Diagnosis not present

## 2015-06-07 DIAGNOSIS — G8929 Other chronic pain: Secondary | ICD-10-CM | POA: Diagnosis not present

## 2015-06-07 DIAGNOSIS — J449 Chronic obstructive pulmonary disease, unspecified: Secondary | ICD-10-CM | POA: Diagnosis not present

## 2015-06-07 DIAGNOSIS — F319 Bipolar disorder, unspecified: Secondary | ICD-10-CM | POA: Diagnosis not present

## 2015-06-07 DIAGNOSIS — L97422 Non-pressure chronic ulcer of left heel and midfoot with fat layer exposed: Secondary | ICD-10-CM | POA: Diagnosis not present

## 2015-06-07 DIAGNOSIS — M1990 Unspecified osteoarthritis, unspecified site: Secondary | ICD-10-CM | POA: Diagnosis not present

## 2015-06-07 DIAGNOSIS — F988 Other specified behavioral and emotional disorders with onset usually occurring in childhood and adolescence: Secondary | ICD-10-CM | POA: Diagnosis not present

## 2015-06-09 DIAGNOSIS — H25013 Cortical age-related cataract, bilateral: Secondary | ICD-10-CM | POA: Diagnosis not present

## 2015-06-09 DIAGNOSIS — L97422 Non-pressure chronic ulcer of left heel and midfoot with fat layer exposed: Secondary | ICD-10-CM | POA: Diagnosis not present

## 2015-06-09 DIAGNOSIS — H43399 Other vitreous opacities, unspecified eye: Secondary | ICD-10-CM | POA: Diagnosis not present

## 2015-06-09 DIAGNOSIS — M1990 Unspecified osteoarthritis, unspecified site: Secondary | ICD-10-CM | POA: Diagnosis not present

## 2015-06-09 DIAGNOSIS — F988 Other specified behavioral and emotional disorders with onset usually occurring in childhood and adolescence: Secondary | ICD-10-CM | POA: Diagnosis not present

## 2015-06-09 DIAGNOSIS — J449 Chronic obstructive pulmonary disease, unspecified: Secondary | ICD-10-CM | POA: Diagnosis not present

## 2015-06-09 DIAGNOSIS — F319 Bipolar disorder, unspecified: Secondary | ICD-10-CM | POA: Diagnosis not present

## 2015-06-09 DIAGNOSIS — G8929 Other chronic pain: Secondary | ICD-10-CM | POA: Diagnosis not present

## 2015-06-13 DIAGNOSIS — L97521 Non-pressure chronic ulcer of other part of left foot limited to breakdown of skin: Secondary | ICD-10-CM | POA: Diagnosis not present

## 2015-06-14 DIAGNOSIS — L97422 Non-pressure chronic ulcer of left heel and midfoot with fat layer exposed: Secondary | ICD-10-CM | POA: Diagnosis not present

## 2015-06-14 DIAGNOSIS — F988 Other specified behavioral and emotional disorders with onset usually occurring in childhood and adolescence: Secondary | ICD-10-CM | POA: Diagnosis not present

## 2015-06-14 DIAGNOSIS — J449 Chronic obstructive pulmonary disease, unspecified: Secondary | ICD-10-CM | POA: Diagnosis not present

## 2015-06-14 DIAGNOSIS — G8929 Other chronic pain: Secondary | ICD-10-CM | POA: Diagnosis not present

## 2015-06-14 DIAGNOSIS — F319 Bipolar disorder, unspecified: Secondary | ICD-10-CM | POA: Diagnosis not present

## 2015-06-14 DIAGNOSIS — M1990 Unspecified osteoarthritis, unspecified site: Secondary | ICD-10-CM | POA: Diagnosis not present

## 2015-06-17 DIAGNOSIS — L97422 Non-pressure chronic ulcer of left heel and midfoot with fat layer exposed: Secondary | ICD-10-CM | POA: Diagnosis not present

## 2015-06-17 DIAGNOSIS — J449 Chronic obstructive pulmonary disease, unspecified: Secondary | ICD-10-CM | POA: Diagnosis not present

## 2015-06-17 DIAGNOSIS — F988 Other specified behavioral and emotional disorders with onset usually occurring in childhood and adolescence: Secondary | ICD-10-CM | POA: Diagnosis not present

## 2015-06-17 DIAGNOSIS — M1990 Unspecified osteoarthritis, unspecified site: Secondary | ICD-10-CM | POA: Diagnosis not present

## 2015-06-17 DIAGNOSIS — F319 Bipolar disorder, unspecified: Secondary | ICD-10-CM | POA: Diagnosis not present

## 2015-06-17 DIAGNOSIS — G8929 Other chronic pain: Secondary | ICD-10-CM | POA: Diagnosis not present

## 2015-06-21 ENCOUNTER — Encounter: Payer: Self-pay | Admitting: Family Medicine

## 2015-06-21 ENCOUNTER — Ambulatory Visit (INDEPENDENT_AMBULATORY_CARE_PROVIDER_SITE_OTHER): Payer: Commercial Managed Care - HMO | Admitting: Family Medicine

## 2015-06-21 VITALS — BP 138/82 | HR 98 | Temp 98.5°F | Resp 16 | Ht 61.0 in | Wt 246.0 lb

## 2015-06-21 DIAGNOSIS — F411 Generalized anxiety disorder: Secondary | ICD-10-CM | POA: Diagnosis not present

## 2015-06-21 DIAGNOSIS — J449 Chronic obstructive pulmonary disease, unspecified: Secondary | ICD-10-CM

## 2015-06-21 DIAGNOSIS — M1712 Unilateral primary osteoarthritis, left knee: Secondary | ICD-10-CM | POA: Diagnosis not present

## 2015-06-21 DIAGNOSIS — Z720 Tobacco use: Secondary | ICD-10-CM | POA: Diagnosis not present

## 2015-06-21 DIAGNOSIS — L97522 Non-pressure chronic ulcer of other part of left foot with fat layer exposed: Secondary | ICD-10-CM | POA: Diagnosis not present

## 2015-06-21 DIAGNOSIS — J441 Chronic obstructive pulmonary disease with (acute) exacerbation: Secondary | ICD-10-CM | POA: Diagnosis not present

## 2015-06-21 DIAGNOSIS — Z79899 Other long term (current) drug therapy: Secondary | ICD-10-CM

## 2015-06-21 DIAGNOSIS — Z23 Encounter for immunization: Secondary | ICD-10-CM

## 2015-06-21 DIAGNOSIS — F1910 Other psychoactive substance abuse, uncomplicated: Secondary | ICD-10-CM | POA: Diagnosis not present

## 2015-06-21 DIAGNOSIS — M5441 Lumbago with sciatica, right side: Secondary | ICD-10-CM | POA: Diagnosis not present

## 2015-06-21 MED ORDER — TIOTROPIUM BROMIDE MONOHYDRATE 2.5 MCG/ACT IN AERS: 2.0000 | INHALATION_SPRAY | Freq: Every day | RESPIRATORY_TRACT | Status: DC

## 2015-06-21 MED ORDER — PREDNISONE 10 MG PO TABS: ORAL_TABLET | ORAL | Status: DC

## 2015-06-21 MED ORDER — ALBUTEROL SULFATE (2.5 MG/3ML) 0.083% IN NEBU: 2.5000 mg | INHALATION_SOLUTION | RESPIRATORY_TRACT | Status: DC | PRN

## 2015-06-21 MED ORDER — NAPROXEN 375 MG PO TABS: 375.0000 mg | ORAL_TABLET | Freq: Two times a day (BID) | ORAL | Status: DC

## 2015-06-21 NOTE — Assessment & Plan Note (Addendum)
Encouraged weight loss; see AVS for recommendations

## 2015-06-21 NOTE — Assessment & Plan Note (Signed)
I will prescribe her naproxen, but no narcotics for this

## 2015-06-21 NOTE — Assessment & Plan Note (Addendum)
Encouraged her to quit; she does not sound ready, but I gave her numbers to call if she decides to try class or QUIT-NOW program

## 2015-06-21 NOTE — Patient Instructions (Addendum)
Check out the information at familydoctor.org entitled "What It Takes to Lose Weight" Try to lose between 1-2 pounds per week by taking in fewer calories and burning off more calories You can succeed by limiting portions, limiting foods dense in calories and fat, becoming more active, and drinking 8 glasses of water a day (64 ounces) Don't skip meals, especially breakfast, as skipping meals may alter your metabolism Do not use over-the-counter weight loss pills or gimmicks that claim rapid weight loss A healthy BMI (or body mass index) is between 18.5 and 24.9 You can calculate your ideal BMI at the [place] website ClubMon[employer]fr I do encourage you to quit smoking Call [phone] to sign up for smoking cessation classes You can call 1-800-QUIT-NOW to talk with a smoking cessation coach Return for fasting labs and follow-up in 4 weeks Start the prednisone STOP the combined nebulizer medicine Use the plain albuterol nebulizer medicine or inhaler You have received the Pneumovax vaccine (PPSV-23) and you will not need another booster of this until AFTER you are 56 years old, per current ACIP guidelines

## 2015-06-21 NOTE — Assessment & Plan Note (Signed)
I will be willing to prescribe naproxen 375 mg BID and continue her lyrica as already written, but will not prescribe her any narcotic pain medicine; see note re: polysubstance use; if she decides she would like to see a pain clinic for her back pain, let me know and we'll make that referral

## 2015-06-21 NOTE — Assessment & Plan Note (Signed)
Patient reviewed controlled substance contract, signed, copy given; she may receive Lyrica only here; she gets anti-anxiety medicine from psychiatrist; she will need to get pain medicine from her podiatrist, OR she may be referred to a pain clinic, but I will not prescribe narcotic pain medicine for her; discussed risk of addiction, reasons to call if she thinks she needs help with prescription pills

## 2015-06-21 NOTE — Assessment & Plan Note (Signed)
She is followed by psychiatrist for this issue; we talked some about her anxiety, mother's death, smoking as a coping mechanism for anxiety, etc.

## 2015-06-21 NOTE — Progress Notes (Signed)
BP 138/82 mmHg  Pulse 98  Temp(Src) 98.5 F (36.9 C) (Oral)  Resp 16  Ht 5\' 1"  (1.549 m)  Wt 246 lb (111.585 kg)  BMI 46.51 kg/m2  SpO2 90%  LMP  (LMP Unknown)   Subjective:    Patient ID: Nicole Austin, female    DOB: 09-01-1960, 55 y.o.   MRN: [id number]  HPI: Nicole Austin is a 55 y.o. female  Chief Complaint  Patient presents with  . Medication Refill  . Follow-up   She is here for regular follow-up She sees psychiatrist for her depression, Dr. Gretel Acre; does not see counselor  She is wearing a boot on the left foot; she has a wound on her left heel; started last year; would not heal; seeing Dr. Elvina Mattes, podiatrist; boot keeps pressure off the heel; it started as a callus; broke out like a blister, then several blisters around it; going twice a month; nurse comes out to the house to redress it every 3 days; it would not close up; was infected; he cuts away the dead skin when she sees podiatrist; no fevers; done with antibiotics  Smoking; if the weather is gloomy, smoke more; can't get out and is stuck inside; either smoke or eat or both; tried the patches but didn't help; she takes anxiety medicine; talked again about mother's passing, grieving, or maybe anxiety with everyday life; smoking is a coping mechanism; does make her feel better  The last few weeks, she has had to do breathing treatments and inhaler; coughing and having trouble catching breath; bringing stuff up; used to use the disc; coughing a lot; has had to have prednisone tapers; did not tolerate Advair, makes her revved up  Not going to pain clinic any more; Dr. Nadine Counts was giving her pain medicine, tramadol, for chronic pain; total knee replacement on the left; foot pain related to the ulcer; back pain as well; she had some red flags from percocet, but no red flags from the tramadol; she only takes it for pain; was in rehabilitation; she does not drink any alcohol; no marijuana; not taking naproxen  but willing to start back  Hx of rhabdo noted in chart  Hx of pneumonia in January; has not had pneumonia vaccine  Depression screen [facility] 2/9 06/21/2015 01/31/2015 01/12/2015 12/10/2014 11/24/2014  Decreased Interest 1 2 0 3 0  Down, Depressed, Hopeless 1 3 0 3 3  PHQ - 2 Score 2 5 0 6 3  Altered sleeping 0 2 - 3 2  Tired, decreased energy 2 1 - 3 2  Change in appetite 2 0 - 3 2  Feeling bad or failure about yourself  0 1 - 3 2  Trouble concentrating 1 1 - 3 3  Moving slowly or fidgety/restless 0 1 - 3 2  Suicidal thoughts 0 0 - 0 0  PHQ-9 Score 7 11 - 24 16  Difficult doing work/chores Somewhat difficult - - Extremely dIfficult Extremely dIfficult   Relevant past medical, surgical, family and social history reviewed Past Medical History  Diagnosis Date  . COPD (chronic obstructive pulmonary disease) ([place])   . PTSD (post-traumatic stress disorder)   . Rhabdomyolysis   . Bipolar 1 disorder ([place])   . Arthritis   . Stroke ([place])   . MI (myocardial infarction) ([place])   . Depression   . ADHD (attention deficit hyperactivity disorder)   . Asthma    Past Surgical History  Procedure Laterality Date  . Replacement total knee Left  Family History  Problem Relation Age of Onset  . Hernia Mother   . Heart disease Mother   . OCD Mother   . Diabetes Mother   . Parkinson's disease Father   . Bipolar disorder Sister   . Schizophrenia Sister   . ADD / ADHD Sister   . Alcohol abuse Brother   . Bipolar disorder Sister   . Paranoid behavior Sister   . ADD / ADHD Sister   Mother died after developing sepsis as complication from herniorrhaphy 2016 per pt report; [place]  Social History  Substance Use Topics  . Smoking status: Current Every Day Smoker -- 0.50 packs/day    Types: Cigarettes    Start date: 10/11/1984  . Smokeless tobacco: Never Used  . Alcohol Use: No  Patient is the oldest of three girls in her family Interim medical history since last visit reviewed. Allergies  and medications reviewed and updated.  Review of Systems Per HPI unless specifically indicated above Down from almost 300 pounds     Objective:    BP 138/82 mmHg  Pulse 98  Temp(Src) 98.5 F (36.9 C) (Oral)  Resp 16  Ht 5\' 1"  (1.549 m)  Wt 246 lb (111.585 kg)  BMI 46.51 kg/m2  SpO2 90%  LMP  (LMP Unknown)  Wt Readings from Last 3 Encounters:  06/21/15 246 lb (111.585 kg)  05/31/15 249 lb (112.946 kg)  05/30/15 249 lb 6.4 oz (113.127 kg)    Physical Exam  Constitutional: She appears well-developed and well-nourished.  Morbidly obese  HENT:  Mouth/Throat: Mucous membranes are normal.  Eyes: EOM are normal. No scleral icterus.  Cardiovascular: Normal rate and regular rhythm.   Pulmonary/Chest: Effort normal. No accessory muscle usage. No respiratory distress. She has no decreased breath sounds. She has wheezes (expiratory wheezes). She has no rhonchi. She has no rales.  Musculoskeletal:  Wearing hard soled shoe on left foot  Skin: She is not diaphoretic. No pallor.  Psychiatric: Her behavior is normal. Her mood appears anxious. She exhibits a depressed mood (briefly when discussing her mother, who died last Fall).  Good eye contact with examiner; polite and cooperative   Results for orders placed or performed during the hospital encounter of 05/31/15  CBC with Differential  Result Value Ref Range   WBC 7.4 3.6 - 11.0 K/uL   RBC 4.70 3.80 - 5.20 MIL/uL   Hemoglobin 13.7 12.0 - 16.0 g/dL   HCT 41.3 35.0 - 47.0 %   MCV 88.0 80.0 - 100.0 fL   MCH 29.1 26.0 - 34.0 pg   MCHC 33.0 32.0 - 36.0 g/dL   RDW 16.0 (H) 11.5 - 14.5 %   Platelets 164 150 - 440 K/uL   Neutrophils Relative % 49 %   Neutro Abs 3.6 1.4 - 6.5 K/uL   Lymphocytes Relative 29 %   Lymphs Abs 2.1 1.0 - 3.6 K/uL   Monocytes Relative 7 %   Monocytes Absolute 0.5 0.2 - 0.9 K/uL   Eosinophils Relative 14 %   Eosinophils Absolute 1.1 (H) 0 - 0.7 K/uL   Basophils Relative 1 %   Basophils Absolute 0.0 0 - 0.1  K/uL  Comprehensive metabolic panel  Result Value Ref Range   Sodium 136 135 - 145 mmol/L   Potassium 4.1 3.5 - 5.1 mmol/L   Chloride 103 101 - 111 mmol/L   CO2 27 22 - 32 mmol/L   Glucose, Bld 89 65 - 99 mg/dL   BUN 11 6 - 20  mg/dL   Creatinine, Ser 0.96 0.44 - 1.00 mg/dL   Calcium 9.1 8.9 - 10.3 mg/dL   Total Protein 7.3 6.5 - 8.1 g/dL   Albumin 3.4 (L) 3.5 - 5.0 g/dL   AST 23 15 - 41 U/L   ALT 10 (L) 14 - 54 U/L   Alkaline Phosphatase 100 38 - 126 U/L   Total Bilirubin 0.6 0.3 - 1.2 mg/dL   GFR calc non Af Amer >60 >60 mL/min   GFR calc Af Amer >60 >60 mL/min   Anion gap 6 5 - 15  Valproic acid level  Result Value Ref Range   Valproic Acid Lvl 29 (L) 50.0 - 100.0 ug/mL  Urinalysis complete, with microscopic ([facility] only)  Result Value Ref Range   Color, Urine STRAW (A) YELLOW   APPearance CLEAR (A) CLEAR   Glucose, UA NEGATIVE NEGATIVE mg/dL   Bilirubin Urine NEGATIVE NEGATIVE   Ketones, ur NEGATIVE NEGATIVE mg/dL   Specific Gravity, Urine 1.008 1.005 - 1.030   Hgb urine dipstick NEGATIVE NEGATIVE   pH 8.0 5.0 - 8.0   Protein, ur NEGATIVE NEGATIVE mg/dL   Nitrite NEGATIVE NEGATIVE   Leukocytes, UA NEGATIVE NEGATIVE   RBC / HPF 0-5 0 - 5 RBC/hpf   WBC, UA 0-5 0 - 5 WBC/hpf   Bacteria, UA RARE (A) NONE SEEN   Squamous Epithelial / LPF 0-5 (A) NONE SEEN   Mucous PRESENT   Troponin I  Result Value Ref Range   Troponin I <0.03 <0.031 ng/mL      Assessment & Plan:   Problem List Items Addressed This Visit      Respiratory   Moderate COPD (chronic obstructive pulmonary disease) (HCC) - Primary    Pneumonia vaccine offered and given today; she did not tolerate LABA; will start her on Spiriva; stop combivent neb; use only SABA neb OR inhaler, not both; smoking cessation urged, and I am here to help if she wants assistance quitting; f/u in 4 weeks      Relevant Medications   Tiotropium Bromide Monohydrate (SPIRIVA RESPIMAT) 2.5 MCG/ACT AERS   albuterol (PROVENTIL) (2.5  MG/3ML) 0.083% nebulizer solution   predniSONE (DELTASONE) 10 MG tablet   Other Relevant Orders   Pneumococcal polysaccharide vaccine 23-valent greater than or equal to 2yo subcutaneous/IM (Completed)     Musculoskeletal and Integument   Arthritis of knee, degenerative    I will prescribe her naproxen, but no narcotics for this      Relevant Medications   predniSONE (DELTASONE) 10 MG tablet   naproxen (NAPROSYN) 375 MG tablet     Other   Polysubstance abuse    I reviewed the [place] and reviewed this with her; she asked for a refill of tramadol today, but had just gotten a prescription from her foot doctor last week; I explained risk of addiction, behaviors suggesting problem use; I explained that I will not prescribe her any narcotic pain medicine; I will prescribe naproxen for her, but no narcotic pain medicine; if she thinks about this issue further and wants to call back, I am here to help if she thinks she has a problem with pain medicine; no shame at all; it can happen to anyone; I am here to help, but will not prescribe her narcotics      Anxiety, generalized    She is followed by psychiatrist for this issue; we talked some about her anxiety, mother's death, smoking as a coping mechanism for anxiety, etc.  Low back pain with sciatica    I will be willing to prescribe naproxen 375 mg BID and continue her lyrica as already written, but will not prescribe her any narcotic pain medicine; see note re: polysubstance use; if she decides she would like to see a pain clinic for her back pain, let me know and we'll make that referral      Relevant Medications   predniSONE (DELTASONE) 10 MG tablet   naproxen (NAPROSYN) 375 MG tablet   Current tobacco use    Encouraged her to quit; she does not sound ready, but I gave her numbers to call if she decides to try class or QUIT-NOW program      Relevant Orders   Pneumococcal polysaccharide vaccine 23-valent greater than or equal to 2yo  subcutaneous/IM (Completed)   Morbid obesity ([place])    Encouraged weight loss      Foot ulcer, left ([place])    Treated by podiatrist; he has provided most recent prescription for pain medicine; I am not going to prescribe any narcotics for her for this or other pain issues that she has; I will be glad to refer her to a pain specialist if she feels like that would be beneficial      Controlled substance agreement signed    Patient reviewed controlled substance contract, signed, copy given; she may receive Lyrica only here; she gets anti-anxiety medicine from psychiatrist; she will need to get pain medicine from her podiatrist, OR she may be referred to a pain clinic, but I will not prescribe narcotic pain medicine for her; discussed risk of addiction, reasons to call if she thinks she needs help with prescription pills       Other Visit Diagnoses    COPD exacerbation ([place])        will treat with prednisone; patient to call if getting worse, would call in Doxycycline if needed; f/u in 4 weeks; quit smoking    Relevant Medications    Tiotropium Bromide Monohydrate (SPIRIVA RESPIMAT) 2.5 MCG/ACT AERS    albuterol (PROVENTIL) (2.5 MG/3ML) 0.083% nebulizer solution    predniSONE (DELTASONE) 10 MG tablet       Follow up plan: Return in about 4 weeks (around 07/19/2015) for fasting labs and visit with Dr. Sanda Klein.  An after-visit summary was printed and given to the patient at [facility].  Please see the patient instructions which may contain other information and recommendations beyond what is mentioned above in the assessment and plan.  Meds ordered this encounter  Medications  . Tiotropium Bromide Monohydrate (SPIRIVA RESPIMAT) 2.5 MCG/ACT AERS    Sig: Inhale 2 puffs into the lungs daily.    Dispense:  1 Inhaler    Refill:  5  . albuterol (PROVENTIL) (2.5 MG/3ML) 0.083% nebulizer solution    Sig: Take 3 mLs (2.5 mg total) by nebulization every 4 (four) hours as needed for wheezing or shortness  of breath.    Dispense:  150 mL    Refill:  2    STOP ipratropium combo neb  . predniSONE (DELTASONE) 10 MG tablet    Sig: Take five pills by mouth on day 1, then 4 pills on day 2, then 3 pills on day 3, then 2 pills on day 4, and 1 on day 5; take with food    Dispense:  15 tablet    Refill:  0  . naproxen (NAPROSYN) 375 MG tablet    Sig: Take 1 tablet (375 mg total) by mouth 2 (two)  times daily with a meal. Take 81 mg coated aspirin daily one hour PRIOR to morning naproxen    Dispense:  60 tablet    Refill:  5   Orders Placed This Encounter  Procedures  . Pneumococcal polysaccharide vaccine 23-valent greater than or equal to 2yo subcutaneous/IM   (Note: Doxy if needed) Face-to-face time with patient was more than 40 minutes, >50% time spent counseling and coordination of care

## 2015-06-21 NOTE — Assessment & Plan Note (Signed)
I reviewed the [place] and reviewed this with her; she asked for a refill of tramadol today, but had just gotten a prescription from her foot doctor last week; I explained risk of addiction, behaviors suggesting problem use; I explained that I will not prescribe her any narcotic pain medicine; I will prescribe naproxen for her, but no narcotic pain medicine; if she thinks about this issue further and wants to call back, I am here to help if she thinks she has a problem with pain medicine; no shame at all; it can happen to anyone; I am here to help, but will not prescribe her narcotics

## 2015-06-21 NOTE — Assessment & Plan Note (Signed)
Treated by podiatrist; he has provided most recent prescription for pain medicine; I am not going to prescribe any narcotics for her for this or other pain issues that she has; I will be glad to refer her to a pain specialist if she feels like that would be beneficial

## 2015-06-21 NOTE — Assessment & Plan Note (Addendum)
Pneumonia vaccine offered and given today; she did not tolerate LABA; will start her on Spiriva; stop combivent neb; use only SABA neb OR inhaler, not both; smoking cessation urged, and I am here to help if she wants assistance quitting; f/u in 4 weeks

## 2015-06-22 DIAGNOSIS — F319 Bipolar disorder, unspecified: Secondary | ICD-10-CM | POA: Diagnosis not present

## 2015-06-22 DIAGNOSIS — F988 Other specified behavioral and emotional disorders with onset usually occurring in childhood and adolescence: Secondary | ICD-10-CM | POA: Diagnosis not present

## 2015-06-22 DIAGNOSIS — G8929 Other chronic pain: Secondary | ICD-10-CM | POA: Diagnosis not present

## 2015-06-22 DIAGNOSIS — L97422 Non-pressure chronic ulcer of left heel and midfoot with fat layer exposed: Secondary | ICD-10-CM | POA: Diagnosis not present

## 2015-06-22 DIAGNOSIS — J449 Chronic obstructive pulmonary disease, unspecified: Secondary | ICD-10-CM | POA: Diagnosis not present

## 2015-06-22 DIAGNOSIS — M1990 Unspecified osteoarthritis, unspecified site: Secondary | ICD-10-CM | POA: Diagnosis not present

## 2015-06-24 DIAGNOSIS — G8929 Other chronic pain: Secondary | ICD-10-CM | POA: Diagnosis not present

## 2015-06-24 DIAGNOSIS — J449 Chronic obstructive pulmonary disease, unspecified: Secondary | ICD-10-CM | POA: Diagnosis not present

## 2015-06-24 DIAGNOSIS — F988 Other specified behavioral and emotional disorders with onset usually occurring in childhood and adolescence: Secondary | ICD-10-CM | POA: Diagnosis not present

## 2015-06-24 DIAGNOSIS — L97422 Non-pressure chronic ulcer of left heel and midfoot with fat layer exposed: Secondary | ICD-10-CM | POA: Diagnosis not present

## 2015-06-24 DIAGNOSIS — M1990 Unspecified osteoarthritis, unspecified site: Secondary | ICD-10-CM | POA: Diagnosis not present

## 2015-06-24 DIAGNOSIS — F319 Bipolar disorder, unspecified: Secondary | ICD-10-CM | POA: Diagnosis not present

## 2015-06-28 DIAGNOSIS — F319 Bipolar disorder, unspecified: Secondary | ICD-10-CM | POA: Diagnosis not present

## 2015-06-28 DIAGNOSIS — F988 Other specified behavioral and emotional disorders with onset usually occurring in childhood and adolescence: Secondary | ICD-10-CM | POA: Diagnosis not present

## 2015-06-28 DIAGNOSIS — G8929 Other chronic pain: Secondary | ICD-10-CM | POA: Diagnosis not present

## 2015-06-28 DIAGNOSIS — J449 Chronic obstructive pulmonary disease, unspecified: Secondary | ICD-10-CM | POA: Diagnosis not present

## 2015-06-28 DIAGNOSIS — L97422 Non-pressure chronic ulcer of left heel and midfoot with fat layer exposed: Secondary | ICD-10-CM | POA: Diagnosis not present

## 2015-06-28 DIAGNOSIS — M1990 Unspecified osteoarthritis, unspecified site: Secondary | ICD-10-CM | POA: Diagnosis not present

## 2015-06-29 ENCOUNTER — Other Ambulatory Visit: Payer: Self-pay | Admitting: Psychiatry

## 2015-06-29 ENCOUNTER — Ambulatory Visit: Payer: Commercial Managed Care - HMO | Admitting: Psychiatry

## 2015-06-29 NOTE — Telephone Encounter (Signed)
called in enough medication for to have until her appt on  07-05-15.  xanax #21, depakote #14 and trazodon #7. pt should have refills on the lexapro and on the seroquel.

## 2015-06-29 NOTE — Telephone Encounter (Signed)
pt called back and stated that she did not have a way to appt.  lea moved appt to 07-05-15. pt was told that i would just call in enough medications to last until her appt date. pt states that was fine.

## 2015-06-29 NOTE — Telephone Encounter (Signed)
pt called states that she did not have a f/u appt and that she needs appt and that she also needs enough medication to do until she is seen again.  pt was told that she had an appt today at 1:15. pt states that she will try and find a ride because she did not know that she had an appt today.

## 2015-06-30 DIAGNOSIS — R209 Unspecified disturbances of skin sensation: Secondary | ICD-10-CM | POA: Insufficient documentation

## 2015-06-30 DIAGNOSIS — Z9889 Other specified postprocedural states: Secondary | ICD-10-CM | POA: Insufficient documentation

## 2015-06-30 DIAGNOSIS — L97422 Non-pressure chronic ulcer of left heel and midfoot with fat layer exposed: Secondary | ICD-10-CM | POA: Diagnosis not present

## 2015-06-30 DIAGNOSIS — J45901 Unspecified asthma with (acute) exacerbation: Secondary | ICD-10-CM

## 2015-06-30 DIAGNOSIS — M810 Age-related osteoporosis without current pathological fracture: Secondary | ICD-10-CM | POA: Insufficient documentation

## 2015-06-30 DIAGNOSIS — I213 ST elevation (STEMI) myocardial infarction of unspecified site: Secondary | ICD-10-CM | POA: Insufficient documentation

## 2015-06-30 DIAGNOSIS — J45909 Unspecified asthma, uncomplicated: Secondary | ICD-10-CM

## 2015-06-30 DIAGNOSIS — I252 Old myocardial infarction: Secondary | ICD-10-CM | POA: Insufficient documentation

## 2015-06-30 DIAGNOSIS — G8929 Other chronic pain: Secondary | ICD-10-CM | POA: Insufficient documentation

## 2015-06-30 DIAGNOSIS — F314 Bipolar disorder, current episode depressed, severe, without psychotic features: Secondary | ICD-10-CM | POA: Insufficient documentation

## 2015-06-30 DIAGNOSIS — M79671 Pain in right foot: Secondary | ICD-10-CM | POA: Diagnosis not present

## 2015-06-30 DIAGNOSIS — G4730 Sleep apnea, unspecified: Secondary | ICD-10-CM

## 2015-06-30 DIAGNOSIS — S92514A Nondisplaced fracture of proximal phalanx of right lesser toe(s), initial encounter for closed fracture: Secondary | ICD-10-CM | POA: Diagnosis not present

## 2015-06-30 DIAGNOSIS — Z8739 Personal history of other diseases of the musculoskeletal system and connective tissue: Secondary | ICD-10-CM | POA: Insufficient documentation

## 2015-06-30 DIAGNOSIS — J40 Bronchitis, not specified as acute or chronic: Secondary | ICD-10-CM | POA: Insufficient documentation

## 2015-06-30 DIAGNOSIS — C801 Malignant (primary) neoplasm, unspecified: Secondary | ICD-10-CM | POA: Insufficient documentation

## 2015-06-30 DIAGNOSIS — M546 Pain in thoracic spine: Secondary | ICD-10-CM | POA: Insufficient documentation

## 2015-06-30 HISTORY — DX: Unspecified asthma with (acute) exacerbation: J45.901

## 2015-06-30 HISTORY — DX: Sleep apnea, unspecified: G47.30

## 2015-06-30 HISTORY — DX: Unspecified asthma, uncomplicated: J45.909

## 2015-06-30 HISTORY — DX: Old myocardial infarction: I25.2

## 2015-07-01 DIAGNOSIS — L97422 Non-pressure chronic ulcer of left heel and midfoot with fat layer exposed: Secondary | ICD-10-CM | POA: Diagnosis not present

## 2015-07-01 DIAGNOSIS — J449 Chronic obstructive pulmonary disease, unspecified: Secondary | ICD-10-CM | POA: Diagnosis not present

## 2015-07-01 DIAGNOSIS — F988 Other specified behavioral and emotional disorders with onset usually occurring in childhood and adolescence: Secondary | ICD-10-CM | POA: Diagnosis not present

## 2015-07-01 DIAGNOSIS — G8929 Other chronic pain: Secondary | ICD-10-CM | POA: Diagnosis not present

## 2015-07-01 DIAGNOSIS — M1990 Unspecified osteoarthritis, unspecified site: Secondary | ICD-10-CM | POA: Diagnosis not present

## 2015-07-01 DIAGNOSIS — F319 Bipolar disorder, unspecified: Secondary | ICD-10-CM | POA: Diagnosis not present

## 2015-07-03 DIAGNOSIS — R69 Illness, unspecified: Secondary | ICD-10-CM | POA: Diagnosis not present

## 2015-07-05 ENCOUNTER — Ambulatory Visit (INDEPENDENT_AMBULATORY_CARE_PROVIDER_SITE_OTHER): Payer: Self-pay | Admitting: Psychiatry

## 2015-07-05 DIAGNOSIS — L97421 Non-pressure chronic ulcer of left heel and midfoot limited to breakdown of skin: Secondary | ICD-10-CM | POA: Diagnosis not present

## 2015-07-05 DIAGNOSIS — J449 Chronic obstructive pulmonary disease, unspecified: Secondary | ICD-10-CM | POA: Diagnosis not present

## 2015-07-05 DIAGNOSIS — G8929 Other chronic pain: Secondary | ICD-10-CM | POA: Diagnosis not present

## 2015-07-05 DIAGNOSIS — F319 Bipolar disorder, unspecified: Secondary | ICD-10-CM | POA: Diagnosis not present

## 2015-07-05 DIAGNOSIS — M1991 Primary osteoarthritis, unspecified site: Secondary | ICD-10-CM | POA: Diagnosis not present

## 2015-07-05 DIAGNOSIS — G629 Polyneuropathy, unspecified: Secondary | ICD-10-CM | POA: Diagnosis not present

## 2015-07-06 ENCOUNTER — Telehealth: Payer: Self-pay

## 2015-07-06 DIAGNOSIS — J449 Chronic obstructive pulmonary disease, unspecified: Secondary | ICD-10-CM | POA: Diagnosis not present

## 2015-07-06 DIAGNOSIS — M1990 Unspecified osteoarthritis, unspecified site: Secondary | ICD-10-CM | POA: Diagnosis not present

## 2015-07-06 MED ORDER — CANE MISC: Status: DC

## 2015-07-06 NOTE — Telephone Encounter (Signed)
Pt wants to see about getting rx for a cane due to broke toe and wound on leg? Pt is in the area and would like to pick up today?

## 2015-07-06 NOTE — Telephone Encounter (Signed)
Yes, will be signed

## 2015-07-08 ENCOUNTER — Emergency Department
Admission: EM | Admit: 2015-07-08 | Discharge: 2015-07-08 | Disposition: A | Discharge status: Home / Self Care | Payer: Commercial Managed Care - HMO | Attending: Emergency Medicine | Admitting: Emergency Medicine

## 2015-07-08 ENCOUNTER — Encounter: Payer: Self-pay | Admitting: Emergency Medicine

## 2015-07-08 DIAGNOSIS — G8929 Other chronic pain: Secondary | ICD-10-CM | POA: Diagnosis not present

## 2015-07-08 DIAGNOSIS — Z791 Long term (current) use of non-steroidal anti-inflammatories (NSAID): Secondary | ICD-10-CM | POA: Insufficient documentation

## 2015-07-08 DIAGNOSIS — L97421 Non-pressure chronic ulcer of left heel and midfoot limited to breakdown of skin: Secondary | ICD-10-CM | POA: Diagnosis not present

## 2015-07-08 DIAGNOSIS — M1991 Primary osteoarthritis, unspecified site: Secondary | ICD-10-CM | POA: Diagnosis not present

## 2015-07-08 DIAGNOSIS — F319 Bipolar disorder, unspecified: Secondary | ICD-10-CM | POA: Diagnosis not present

## 2015-07-08 DIAGNOSIS — J449 Chronic obstructive pulmonary disease, unspecified: Secondary | ICD-10-CM | POA: Insufficient documentation

## 2015-07-08 DIAGNOSIS — M179 Osteoarthritis of knee, unspecified: Secondary | ICD-10-CM | POA: Diagnosis not present

## 2015-07-08 DIAGNOSIS — Z8673 Personal history of transient ischemic attack (TIA), and cerebral infarction without residual deficits: Secondary | ICD-10-CM | POA: Diagnosis not present

## 2015-07-08 DIAGNOSIS — G629 Polyneuropathy, unspecified: Secondary | ICD-10-CM | POA: Diagnosis not present

## 2015-07-08 DIAGNOSIS — J45909 Unspecified asthma, uncomplicated: Secondary | ICD-10-CM | POA: Insufficient documentation

## 2015-07-08 DIAGNOSIS — L03119 Cellulitis of unspecified part of limb: Secondary | ICD-10-CM

## 2015-07-08 DIAGNOSIS — F17210 Nicotine dependence, cigarettes, uncomplicated: Secondary | ICD-10-CM | POA: Diagnosis not present

## 2015-07-08 DIAGNOSIS — M7989 Other specified soft tissue disorders: Secondary | ICD-10-CM | POA: Diagnosis present

## 2015-07-08 DIAGNOSIS — F909 Attention-deficit hyperactivity disorder, unspecified type: Secondary | ICD-10-CM | POA: Diagnosis not present

## 2015-07-08 DIAGNOSIS — L03116 Cellulitis of left lower limb: Secondary | ICD-10-CM | POA: Insufficient documentation

## 2015-07-08 DIAGNOSIS — L03115 Cellulitis of right lower limb: Secondary | ICD-10-CM | POA: Diagnosis not present

## 2015-07-08 DIAGNOSIS — Z79899 Other long term (current) drug therapy: Secondary | ICD-10-CM | POA: Insufficient documentation

## 2015-07-08 DIAGNOSIS — M79672 Pain in left foot: Secondary | ICD-10-CM | POA: Diagnosis not present

## 2015-07-08 DIAGNOSIS — I252 Old myocardial infarction: Secondary | ICD-10-CM | POA: Insufficient documentation

## 2015-07-08 LAB — COMPREHENSIVE METABOLIC PANEL
ALT: 30 U/L (ref 14–54)
AST: 36 U/L (ref 15–41)
Albumin: 3.3 g/dL — ABNORMAL LOW (ref 3.5–5.0)
Alkaline Phosphatase: 92 U/L (ref 38–126)
Anion gap: 6 (ref 5–15)
BILIRUBIN TOTAL: 0.5 mg/dL (ref 0.3–1.2)
BUN: 7 mg/dL (ref 6–20)
CHLORIDE: 102 mmol/L (ref 101–111)
CO2: 32 mmol/L (ref 22–32)
Calcium: 8.9 mg/dL (ref 8.9–10.3)
Creatinine, Ser: 1.11 mg/dL — ABNORMAL HIGH (ref 0.44–1.00)
GFR, EST NON AFRICAN AMERICAN: 54 mL/min — AB (ref >60)
Glucose, Bld: 97 mg/dL (ref 65–99)
POTASSIUM: 4.5 mmol/L (ref 3.5–5.1)
Sodium: 140 mmol/L (ref 135–145)
TOTAL PROTEIN: 6.1 g/dL — AB (ref 6.5–8.1)

## 2015-07-08 LAB — CBC WITH DIFFERENTIAL/PLATELET
Basophils Absolute: 0.1 10*3/uL (ref 0–0.1)
Basophils Relative: 1 %
EOS ABS: 0.6 10*3/uL (ref 0–0.7)
EOS PCT: 10 %
HCT: 36.1 % (ref 35.0–47.0)
HEMOGLOBIN: 12.3 g/dL (ref 12.0–16.0)
LYMPHS ABS: 1.7 10*3/uL (ref 1.0–3.6)
LYMPHS PCT: 30 %
MCH: 29.7 pg (ref 26.0–34.0)
MCHC: 34.1 g/dL (ref 32.0–36.0)
MCV: 87.2 fL (ref 80.0–100.0)
Monocytes Absolute: 0.8 10*3/uL (ref 0.2–0.9)
Monocytes Relative: 14 %
NEUTROS PCT: 45 %
Neutro Abs: 2.5 10*3/uL (ref 1.4–6.5)
PLATELETS: 156 10*3/uL (ref 150–440)
RBC: 4.14 MIL/uL (ref 3.80–5.20)
RDW: 17.2 % — ABNORMAL HIGH (ref 11.5–14.5)
WBC: 5.7 10*3/uL (ref 3.6–11.0)

## 2015-07-08 MED ORDER — CLINDAMYCIN HCL 300 MG PO CAPS: 300.0000 mg | ORAL_CAPSULE | Freq: Four times a day (QID) | ORAL | Status: DC

## 2015-07-08 MED ORDER — KETOROLAC TROMETHAMINE 10 MG PO TABS: 10.0000 mg | ORAL_TABLET | Freq: Three times a day (TID) | ORAL | Status: DC

## 2015-07-08 MED ORDER — CLINDAMYCIN HCL 150 MG PO CAPS: 300.0000 mg | ORAL_CAPSULE | Freq: Once | ORAL | Status: DC

## 2015-07-08 MED ORDER — KETOROLAC TROMETHAMINE 60 MG/2ML IM SOLN: 30.0000 mg | Freq: Once | INTRAMUSCULAR | Status: DC

## 2015-07-08 NOTE — ED Provider Notes (Signed)
[facility] Emergency Department Provider Note ____________________________________________  Time seen: 1612  I have reviewed the triage vital signs and the nursing notes.  HISTORY  Chief Complaint  Leg Swelling  HPI Nicole Austin is a 55 y.o. female presents to the ED for evaluation of bilateral leg edema. She arrives via EMS from home, where her wound care nurse dressed her chronic left heel ulcer today. She was last seen by this same home health nurse 3-days prior for routine wound care. The RN reported to EMS that her legs are more swollen and erythematous than on Tuesday. The nurse called EMS for transport to the ED for evaluation and management of her BLE "cellullitis". The wound is apparently stable. The patient denies interim fevers, chills, sweats, or SOB. She has a history of obesity, COPD/chronic bronchitis, MI, and smoking. She denies HTN, CHF, CAD, or DM. She reports pain in the legs due to swelling. She has not been able to wear her [place] boot due to leg swelling. She reports her pain at 8/10 upon arrival.   Past Medical History  Diagnosis Date  . COPD (chronic obstructive pulmonary disease) ([place])   . PTSD (post-traumatic stress disorder)   . Rhabdomyolysis   . Bipolar 1 disorder ([place])   . Arthritis   . Stroke ([place])   . MI (myocardial infarction) ([place])   . Depression   . ADHD (attention deficit hyperactivity disorder)   . Asthma     Patient Active Problem List   Diagnosis Date Noted  . Controlled substance agreement signed 06/21/2015  . HLD (hyperlipidemia) 05/13/2015  . Foot ulcer, left ([place]) 04/27/2015  . Chronic pain disorder 04/27/2015  . Stroke ([place]) 03/25/2015  . Bipolar I disorder, most recent episode depressed ([place]) 12/22/2014  . Morbid obesity ([place]) 12/22/2014  . Affective bipolar disorder ([place]) 08/16/2014  . Gout 08/16/2014  . Extreme obesity ([place]) 08/16/2014  . Muscle spasms of both lower extremities 08/16/2014  . Back  pain, chronic 08/13/2014  . Low back pain with sciatica 08/04/2014  . Current tobacco use 08/04/2014  . Polysubstance abuse 07/04/2014  . Anxiety, generalized 11/24/2013  . Imbalance 11/24/2013  . Blurred vision 11/24/2013  . Cephalalgia 11/24/2013  . COPD, moderate ([place]) 11/18/2013  . Moderate COPD (chronic obstructive pulmonary disease) ([place]) 11/18/2013  . HPV (human papilloma virus) infection 07/17/2013  . Arthritis of knee, degenerative 07/15/2013  . H/O neoplasm 07/31/2011    Past Surgical History  Procedure Laterality Date  . Replacement total knee Left     Current Outpatient Rx  Name  Route  Sig  Dispense  Refill  . albuterol (PROVENTIL HFA;VENTOLIN HFA) 108 (90 Base) MCG/ACT inhaler   Inhalation   Inhale 2 puffs into the lungs every 6 (six) hours as needed for wheezing or shortness of breath.   1 Inhaler   0   . albuterol (PROVENTIL) (2.5 MG/3ML) 0.083% nebulizer solution   Nebulization   Take 3 mLs (2.5 mg total) by nebulization every 4 (four) hours as needed for wheezing or shortness of breath.   150 mL   2     STOP ipratropium combo neb   . ALPRAZolam (XANAX) 0.5 MG tablet   Oral   Take 1 tablet (0.5 mg total) by mouth 3 (three) times daily.   90 tablet   0   . clindamycin (CLEOCIN) 300 MG capsule   Oral   Take 1 capsule (300 mg total) by mouth 4 (four) times daily.   40 capsule  0   . collagenase (SANTYL) ointment   Topical   Apply topically daily.   15 g   0   . cyclobenzaprine (FLEXERIL) 5 MG tablet   Oral   Take 1 tablet (5 mg total) by mouth 3 (three) times daily as needed for muscle spasms.   90 tablet   2   . divalproex (DEPAKOTE) 250 MG DR tablet   Oral   Take 1 tablet (250 mg total) by mouth 2 (two) times daily.   60 tablet   1   . escitalopram (LEXAPRO) 20 MG tablet   Oral   Take 1 tablet (20 mg total) by mouth every other day.   30 tablet   2   . ketorolac (TORADOL) 10 MG tablet   Oral   Take 1 tablet (10 mg total) by  mouth every 8 (eight) hours.   15 tablet   0   . lidocaine (XYLOCAINE) 5 % ointment   Topical   Apply 1 application topically as needed for mild pain.         Marland Kitchen LYRICA 200 MG capsule   Oral   Take 1 capsule (200 mg total) by mouth 2 (two) times daily.   60 capsule   5     Dispense as written.   . Misc. Devices (CANE) MISC      Use as needed   1 each   0   . Multiple Vitamin (MULTIVITAMIN WITH MINERALS) TABS tablet   Oral   Take 1 tablet by mouth daily.         . naproxen (NAPROSYN) 375 MG tablet   Oral   Take 1 tablet (375 mg total) by mouth 2 (two) times daily with a meal. Take 81 mg coated aspirin daily one hour PRIOR to morning naproxen   60 tablet   5   . predniSONE (DELTASONE) 10 MG tablet      Take five pills by mouth on day 1, then 4 pills on day 2, then 3 pills on day 3, then 2 pills on day 4, and 1 on day 5; take with food   15 tablet   0   . QUEtiapine (SEROQUEL XR) 300 MG 24 hr tablet   Oral   Take 1 tablet (300 mg total) by mouth at bedtime.   30 tablet   1   . Tiotropium Bromide Monohydrate (SPIRIVA RESPIMAT) 2.5 MCG/ACT AERS   Inhalation   Inhale 2 puffs into the lungs daily.   1 Inhaler   5   . traMADol (ULTRAM) 50 MG tablet   Oral   Take 1 tablet (50 mg total) by mouth every 12 (twelve) hours as needed.   60 tablet   0     Refill 04/27/15   . traZODone (DESYREL) 100 MG tablet   Oral   Take 1 tablet (100 mg total) by mouth at bedtime.   30 tablet   0     Pt has supply     Allergies Codeine  Family History  Problem Relation Age of Onset  . Hernia Mother   . Heart disease Mother   . OCD Mother   . Diabetes Mother   . Parkinson's disease Father   . Bipolar disorder Sister   . Schizophrenia Sister   . ADD / ADHD Sister   . Alcohol abuse Brother   . Bipolar disorder Sister   . Paranoid behavior Sister   . ADD / ADHD Sister  Social History Social History  Substance Use Topics  . Smoking status: Current Every Day  Smoker -- 0.50 packs/day    Types: Cigarettes    Start date: 10/11/1984  . Smokeless tobacco: Never Used  . Alcohol Use: No    Review of Systems  Constitutional: Negative for fever, chills, or sweats. Cardiovascular: Negative for chest pain. Respiratory: Negative for shortness of breath. Gastrointestinal: Negative for abdominal pain, vomiting and diarrhea. Genitourinary: Negative for dysuria. Denies urinary frequency Musculoskeletal: Negative for back pain. BLE edema as above. Right pinky toe fracture.  Skin: Negative for rash. Chronic left heel wound Neurological: Negative for headaches, focal weakness or numbness. ____________________________________________  PHYSICAL EXAM:  VITAL SIGNS: ED Triage Vitals  Enc Vitals Group     BP 07/08/15 1600 104/62 mmHg     Pulse Rate 07/08/15 1600 90     Resp 07/08/15 1600 18     Temp 07/08/15 1600 97.6 F (36.4 C)     Temp Source 07/08/15 1600 Oral     SpO2 07/08/15 1600 98 %     Weight 07/08/15 1600 245 lb (111.131 kg)     Height 07/08/15 1600 5\' 1"  (1.549 m)     Head Cir --      Peak Flow --      Pain Score 07/08/15 1559 8     Pain Loc --      Pain Edu? --      Excl. in [place]? --    Constitutional: Alert and oriented. Well appearing and in no distress. Head: Normocephalic and atraumatic. Hematological/Lymphatic/Immunological: No cervical lymphadenopathy. Cardiovascular: Normal rate, regular rhythm. Normal distal pulses and cap refill Respiratory: Normal respiratory effort. No wheezes/rales/rhonchi. Gastrointestinal: Soft and nontender. No distention, rebound, guarding, or organomegaly. Musculoskeletal: Nontender with normal range of motion in all extremities. BLE with 2+ pitting edema noted from the feet to the knees. Left heel dressing in place.  Neurologic:  Normal speech and language. No gross focal neurologic deficits are appreciated. Skin:  Skin is warm, dry and intact. No rash noted. Erythema noted to the bilateral LEs. Skin  is without weeping, ulceration, or lesions.  Psychiatric: Mood and affect are normal. Patient exhibits appropriate insight and judgment. ____________________________________________   LABS (pertinent positives/negatives) Labs Reviewed  COMPREHENSIVE METABOLIC PANEL - Abnormal; Notable for the following:    Creatinine, Ser 1.11 (*)    Total Protein 6.1 (*)    Albumin 3.3 (*)    GFR calc non Af Amer 54 (*)    All other components within normal limits  CBC WITH DIFFERENTIAL/PLATELET - Abnormal; Notable for the following:    RDW 17.2 (*)    All other components within normal limits  ____________________________________________  PROCEDURES  Toradol 30 mg IM Cleocin 300 mg PO ____________________________________________  INITIAL IMPRESSION / ASSESSMENT AND PLAN / ED COURSE  Patient with a cellulitis of the bilateral LEs without evidence of sepsis. She will be discharged for outpatient management with clindamycin and ketorolac. She will follow-up with Dr. Sanda Klein on Monday, as scheduled. She should rest with her feet elevated to reduce swelling. Return to the ED as needed.  ____________________________________________  FINAL CLINICAL IMPRESSION(S) / ED DIAGNOSES  Final diagnoses:  Cellulitis of lower extremity, unspecified laterality     Melvenia Needles, PA-C 07/08/15 1855  Nance Pear, MD 07/08/15 2115

## 2015-07-08 NOTE — Discharge Instructions (Signed)
Cellulitis Cellulitis is an infection of the skin and the tissue beneath it. The infected area is usually red and tender. Cellulitis occurs most often in the arms and lower legs.  CAUSES  Cellulitis is caused by bacteria that enter the skin through cracks or cuts in the skin. The most common types of bacteria that cause cellulitis are staphylococci and streptococci. SIGNS AND SYMPTOMS   Redness and warmth.  Swelling.  Tenderness or pain.  Fever. DIAGNOSIS  Your health care provider can usually determine what is wrong based on a physical exam. Blood tests may also be done. TREATMENT  Treatment usually involves taking an antibiotic medicine. HOME CARE INSTRUCTIONS   Take your antibiotic medicine as directed by your health care provider. Finish the antibiotic even if you start to feel better.  Keep the infected arm or leg elevated to reduce swelling.  Apply a warm cloth to the affected area up to 4 times per day to relieve pain.  Take medicines only as directed by your health care provider.  Keep all follow-up visits as directed by your health care provider. SEEK MEDICAL CARE IF:   You notice red streaks coming from the infected area.  Your red area gets larger or turns dark in color.  Your bone or joint underneath the infected area becomes painful after the skin has healed.  Your infection returns in the same area or another area.  You notice a swollen bump in the infected area.  You develop new symptoms.  You have a fever. SEEK IMMEDIATE MEDICAL CARE IF:   You feel very sleepy.  You develop vomiting or diarrhea.  You have a general ill feeling (malaise) with muscle aches and pains.   This information is not intended to replace advice given to you by your health care provider. Make sure you discuss any questions you have with your health care provider.   Document Released: 11/22/2004 Document Revised: 11/03/2014 Document Reviewed: 04/30/2011 Elsevier Interactive  Patient Education [employer].   Your labs were normal today. You should start the antibiotic tomorrow for your cellulitis. You must rest with the legs elevated to reduce swelling. Follow-up with Dr. Sanda Klein on Monday, as scheduled. Take the ketorolac for pain and inflammation, in place of your previously prescribed Naproxen. Return to the ED if needed.

## 2015-07-08 NOTE — ED Notes (Signed)
Patient states she has cellulitis in both legs and feet. Has a wound on left heel that is being managed by podiatry.  Patient has difficulty walking without pain.  Patient noticed increased swelling in lower legs.

## 2015-07-08 NOTE — ED Notes (Signed)
Attempted IV access x1. Unsuccessful, however, able to obtain needed blood work.

## 2015-07-08 NOTE — ED Notes (Signed)
Home health ns sent pt to ER to evaluate wound on left foot.

## 2015-07-12 DIAGNOSIS — L97421 Non-pressure chronic ulcer of left heel and midfoot limited to breakdown of skin: Secondary | ICD-10-CM | POA: Diagnosis not present

## 2015-07-12 DIAGNOSIS — F319 Bipolar disorder, unspecified: Secondary | ICD-10-CM | POA: Diagnosis not present

## 2015-07-12 DIAGNOSIS — J449 Chronic obstructive pulmonary disease, unspecified: Secondary | ICD-10-CM | POA: Diagnosis not present

## 2015-07-12 DIAGNOSIS — G629 Polyneuropathy, unspecified: Secondary | ICD-10-CM | POA: Diagnosis not present

## 2015-07-12 DIAGNOSIS — G8929 Other chronic pain: Secondary | ICD-10-CM | POA: Diagnosis not present

## 2015-07-12 DIAGNOSIS — M1991 Primary osteoarthritis, unspecified site: Secondary | ICD-10-CM | POA: Diagnosis not present

## 2015-07-13 ENCOUNTER — Encounter: Payer: Self-pay | Admitting: Psychiatry

## 2015-07-13 ENCOUNTER — Ambulatory Visit (INDEPENDENT_AMBULATORY_CARE_PROVIDER_SITE_OTHER): Payer: Commercial Managed Care - HMO | Admitting: Psychiatry

## 2015-07-13 VITALS — BP 138/84 | HR 86 | Temp 97.3°F | Ht 61.0 in

## 2015-07-13 DIAGNOSIS — F4310 Post-traumatic stress disorder, unspecified: Secondary | ICD-10-CM

## 2015-07-13 DIAGNOSIS — F3130 Bipolar disorder, current episode depressed, mild or moderate severity, unspecified: Secondary | ICD-10-CM | POA: Diagnosis not present

## 2015-07-13 DIAGNOSIS — F988 Other specified behavioral and emotional disorders with onset usually occurring in childhood and adolescence: Secondary | ICD-10-CM | POA: Insufficient documentation

## 2015-07-13 MED ORDER — ESCITALOPRAM OXALATE 20 MG PO TABS: 20.0000 mg | ORAL_TABLET | ORAL | Status: DC

## 2015-07-13 MED ORDER — QUETIAPINE FUMARATE ER 300 MG PO TB24: 300.0000 mg | ORAL_TABLET | Freq: Every day | ORAL | Status: DC

## 2015-07-13 MED ORDER — ALPRAZOLAM 0.5 MG PO TABS: 0.5000 mg | ORAL_TABLET | Freq: Three times a day (TID) | ORAL | Status: DC

## 2015-07-13 MED ORDER — DIVALPROEX SODIUM 250 MG PO DR TAB: 250.0000 mg | DELAYED_RELEASE_TABLET | Freq: Two times a day (BID) | ORAL | Status: DC

## 2015-07-13 NOTE — Progress Notes (Signed)
Patient ID: Nicole Austin Austin, female   DOB: 1960-11-16, 55 y.o.   MRN: [id number] [facility] MD/PA/NP OP Progress Note  07/13/2015 2:49 PM Nicole Austin Austin  MRN:  [id number]  Subjective:  Patient is a [name] with history of bipolar disorder and posttraumatic stress disorder who was seen for follow-up. She patient reported that the medication changes have really helped her and she has been noticing improvement in her mood symptoms. She does not feel depressed or angry. She brought her medication bottles with them. She reported that she is currently in the wheelchair due to worsening of her cellulitis. Her legs look swollen but she has been following with her primary care physician on a regular basis. She stated that she was seen recently. Home health nurse comes to her house on a regular basis and has with her medication as well as with her daily chores. Patient currently denied worsening of her symptoms. She feels that the current combination is really helping her and she is not having any suicidal homicidal ideations or plans. She currently lives by herself.   She is compliant with her medications. She is not having any thoughts to hurt herself.    Chief Complaint: pain Chief Complaint    Follow-up; Medication Refill     Visit Diagnosis:     ICD-9-CM ICD-10-CM   1. PTSD (post-traumatic stress disorder) 309.81 F43.10   2. Bipolar I disorder, most recent episode depressed ([place]) 296.50 F31.30     Past Medical History:  Past Medical History  Diagnosis Date  . COPD (chronic obstructive pulmonary disease) ([place])   . PTSD (post-traumatic stress disorder)   . Rhabdomyolysis   . Bipolar 1 disorder ([place])   . Arthritis   . Stroke ([place])   . MI (myocardial infarction) ([place])   . Depression   . ADHD (attention deficit hyperactivity disorder)   . Asthma     Past Surgical History  Procedure Laterality Date  . Replacement total knee Left    Family History:  Family History   Problem Relation Age of Onset  . Hernia Mother   . Heart disease Mother   . OCD Mother   . Diabetes Mother   . Parkinson's disease Father   . Bipolar disorder Sister   . Schizophrenia Sister   . ADD / ADHD Sister   . Alcohol abuse Brother   . Bipolar disorder Sister   . Paranoid behavior Sister   . ADD / ADHD Sister    Social History:  Social History   Social History  . Marital Status: Divorced    Spouse Name: N/A  . Number of Children: N/A  . Years of Education: N/A   Social History Main Topics  . Smoking status: Current Every Day Smoker -- 0.50 packs/day    Types: Cigarettes    Start date: 10/11/1984  . Smokeless tobacco: Never Used  . Alcohol Use: No  . Drug Use: No  . Sexual Activity: No   Other Topics Concern  . None   Social History Narrative   Additional History:   Assessment:   Musculoskeletal: Strength & Muscle Tone: within normal limits Gait & Station: limping Patient leans: N/A  Psychiatric Specialty Exam: Anxiety Symptoms include insomnia and nervous/anxious behavior. Patient reports no suicidal ideas.    Depression        Associated symptoms include insomnia.  Associated symptoms include no suicidal ideas.  Past medical history includes anxiety.   Insomnia PMH includes: depression.    Review of  Systems  Psychiatric/Behavioral: Positive for depression. Negative for suicidal ideas, hallucinations, memory loss and substance abuse. The patient is nervous/anxious and has insomnia.     Blood pressure 138/84, pulse 86, temperature 97.3 F (36.3 C), temperature source Tympanic, height 5\' 1"  (1.549 m), SpO2 89 %.There is no weight on file to calculate BMI.  General Appearance: Disheveled   Eye Contact:  Good  Speech:  Clear and Coherent and Normal Rate  Volume:  Normal  Mood:  Anxious and Depressed  Affect:  Constricted and tearful   Thought Process:  Linear  Orientation:  Full (Time, Place, and Person)  Thought Content:  Negative  Suicidal  Thoughts:  No  Homicidal Thoughts:  No  Memory:  Immediate;   Good Recent;   Good Remote;   Good  Judgement:  Good  Insight:  Good  Psychomotor Activity:  Negative  Concentration:  Good  Recall:  Good  Fund of Knowledge: Good  Language: Good  Akathisia:  Negative  Handed:  Right unknown  AIMS (if indicated):  Not done  Assets:  Communication Skills Housing Social Support  ADL's:  Intact  Cognition: WNL  Sleep:  Good   Is the patient at risk to self?  No. Has the patient been a risk to self in the past 6 months?  No. Has the patient been a risk to self within the distant past?  Yes.   Is the patient a risk to others?  No. Has the patient been a risk to others in the past 6 months?  No. Has the patient been a risk to others within the distant past?  No.  Current Medications: Current Outpatient Prescriptions  Medication Sig Dispense Refill  . albuterol (PROVENTIL HFA;VENTOLIN HFA) 108 (90 Base) MCG/ACT inhaler Inhale 2 puffs into the lungs every 6 (six) hours as needed for wheezing or shortness of breath. 1 Inhaler 0  . albuterol (PROVENTIL) (2.5 MG/3ML) 0.083% nebulizer solution Take 3 mLs (2.5 mg total) by nebulization every 4 (four) hours as needed for wheezing or shortness of breath. 150 mL 2  . ALPRAZolam (XANAX) 0.5 MG tablet Take 1 tablet (0.5 mg total) by mouth 3 (three) times daily. 90 tablet 1  . collagenase (SANTYL) ointment Apply topically daily. 15 g 0  . cyclobenzaprine (FLEXERIL) 5 MG tablet Take 1 tablet (5 mg total) by mouth 3 (three) times daily as needed for muscle spasms. 90 tablet 2  . divalproex (DEPAKOTE) 250 MG DR tablet Take 1 tablet (250 mg total) by mouth 2 (two) times daily. 60 tablet 1  . doxycycline (VIBRAMYCIN) 100 MG capsule     . escitalopram (LEXAPRO) 20 MG tablet Take 1 tablet (20 mg total) by mouth every other day. 30 tablet 2  . ketorolac (TORADOL) 10 MG tablet Take 1 tablet (10 mg total) by mouth every 8 (eight) hours. 15 tablet 0  .  lidocaine (XYLOCAINE) 5 % ointment Apply 1 application topically as needed for mild pain.    Marland Kitchen LYRICA 200 MG capsule Take 1 capsule (200 mg total) by mouth 2 (two) times daily. 60 capsule 5  . Misc. Devices (CANE) MISC Use as needed 1 each 0  . Multiple Vitamin (MULTIVITAMIN WITH MINERALS) TABS tablet Take 1 tablet by mouth daily.    . naproxen (NAPROSYN) 375 MG tablet Take 1 tablet (375 mg total) by mouth 2 (two) times daily with a meal. Take 81 mg coated aspirin daily one hour PRIOR to morning naproxen 60 tablet 5  . predniSONE (  DELTASONE) 10 MG tablet Take five pills by mouth on day 1, then 4 pills on day 2, then 3 pills on day 3, then 2 pills on day 4, and 1 on day 5; take with food 15 tablet 0  . QUEtiapine (SEROQUEL XR) 300 MG 24 hr tablet Take 1 tablet (300 mg total) by mouth at bedtime. 30 tablet 1  . traMADol (ULTRAM) 50 MG tablet Take 1 tablet (50 mg total) by mouth every 12 (twelve) hours as needed. 60 tablet 0   No current facility-administered medications for this visit.    Medical Decision Making:  Established Problem, Stable/Improving (1) and Review or order clinical lab tests (1)  Treatment Plan Summary:Medication management    Bipolar disorder-continue Seroquel XR at 300 mg at bedtime.   Discussed with patient about her Xanax dose continue  0.5 mg by mouth 3 times a day and patient agreed with the plan.  Continue her Lexapro 20 mg a day. She is on Depakote 250 mg twice a day medication refills.   PTSD-stable. Will continue the Lexapro and Seroquel.  Patient will follow up in 2 months or earlier depending on her symptoms    More than 50% of the time spent in psychoeducation, counseling and coordination of care.    This note was generated in part or whole with voice recognition software. Voice regonition is usually quite accurate but there are transcription errors that can and very often do occur. I apologize for any typographical errors that were not detected and  corrected.    Rainey Pines, MD  07/13/2015, 2:49 PM

## 2015-07-14 ENCOUNTER — Telehealth: Payer: Self-pay

## 2015-07-14 NOTE — Telephone Encounter (Signed)
Pt called states has cellulitis and is currently on an antibiotic.  Wants to see if she can get rx for pain med?

## 2015-07-14 NOTE — Telephone Encounter (Signed)
No, I'm sorry

## 2015-07-15 ENCOUNTER — Encounter: Payer: Self-pay | Admitting: *Deleted

## 2015-07-15 ENCOUNTER — Telehealth: Payer: Self-pay | Admitting: Family Medicine

## 2015-07-15 ENCOUNTER — Emergency Department
Admission: EM | Admit: 2015-07-15 | Discharge: 2015-07-16 | Disposition: A | Discharge status: Home / Self Care | Payer: Commercial Managed Care - HMO | Attending: Emergency Medicine | Admitting: Emergency Medicine

## 2015-07-15 DIAGNOSIS — F329 Major depressive disorder, single episode, unspecified: Secondary | ICD-10-CM | POA: Diagnosis not present

## 2015-07-15 DIAGNOSIS — M1991 Primary osteoarthritis, unspecified site: Secondary | ICD-10-CM | POA: Diagnosis not present

## 2015-07-15 DIAGNOSIS — F319 Bipolar disorder, unspecified: Secondary | ICD-10-CM | POA: Diagnosis not present

## 2015-07-15 DIAGNOSIS — J45909 Unspecified asthma, uncomplicated: Secondary | ICD-10-CM | POA: Insufficient documentation

## 2015-07-15 DIAGNOSIS — M79605 Pain in left leg: Secondary | ICD-10-CM | POA: Diagnosis present

## 2015-07-15 DIAGNOSIS — F909 Attention-deficit hyperactivity disorder, unspecified type: Secondary | ICD-10-CM | POA: Insufficient documentation

## 2015-07-15 DIAGNOSIS — Z7982 Long term (current) use of aspirin: Secondary | ICD-10-CM | POA: Insufficient documentation

## 2015-07-15 DIAGNOSIS — G629 Polyneuropathy, unspecified: Secondary | ICD-10-CM | POA: Diagnosis not present

## 2015-07-15 DIAGNOSIS — M1990 Unspecified osteoarthritis, unspecified site: Secondary | ICD-10-CM | POA: Insufficient documentation

## 2015-07-15 DIAGNOSIS — R609 Edema, unspecified: Secondary | ICD-10-CM | POA: Diagnosis not present

## 2015-07-15 DIAGNOSIS — G8929 Other chronic pain: Secondary | ICD-10-CM | POA: Diagnosis not present

## 2015-07-15 DIAGNOSIS — J449 Chronic obstructive pulmonary disease, unspecified: Secondary | ICD-10-CM | POA: Diagnosis not present

## 2015-07-15 DIAGNOSIS — L97421 Non-pressure chronic ulcer of left heel and midfoot limited to breakdown of skin: Secondary | ICD-10-CM | POA: Diagnosis not present

## 2015-07-15 DIAGNOSIS — F17210 Nicotine dependence, cigarettes, uncomplicated: Secondary | ICD-10-CM | POA: Insufficient documentation

## 2015-07-15 DIAGNOSIS — Z8673 Personal history of transient ischemic attack (TIA), and cerebral infarction without residual deficits: Secondary | ICD-10-CM | POA: Diagnosis not present

## 2015-07-15 DIAGNOSIS — I252 Old myocardial infarction: Secondary | ICD-10-CM | POA: Diagnosis not present

## 2015-07-15 DIAGNOSIS — Z79899 Other long term (current) drug therapy: Secondary | ICD-10-CM | POA: Diagnosis not present

## 2015-07-15 DIAGNOSIS — R600 Localized edema: Secondary | ICD-10-CM | POA: Diagnosis not present

## 2015-07-15 NOTE — Telephone Encounter (Signed)
It sounds like she needs to go to the urgent care or ER now

## 2015-07-15 NOTE — ED Provider Notes (Signed)
[facility] Emergency Department Provider Note   ____________________________________________  Time seen: Approximately 11:50 PM  I have reviewed the triage vital signs and the nursing notes.   HISTORY  Chief Complaint Leg Pain    HPI Nicole Austin is a 55 y.o. female who presents to the ED from home with a chief complaint of BLE pain and swelling. Patient has a history of BLE edema; started on clindamycin for cellulitis approximately one week ago. Patient has a home health nurse that comes to change her bandages on her left foot for chronic ulcer. Patient states she is having increased swelling to bilateral lower extremities and pain. Denies associated symptoms of fever, chills, chest pain, shortness of breath, abdominal pain, nausea, vomiting, diarrhea. Nothing makes her symptoms better. Ambulation makes her symptoms worse. Denies recent travel or trauma.   Past Medical History  Diagnosis Date  . COPD (chronic obstructive pulmonary disease) ([place])   . PTSD (post-traumatic stress disorder)   . Rhabdomyolysis   . Bipolar 1 disorder ([place])   . Arthritis   . Stroke ([place])   . MI (myocardial infarction) ([place])   . Depression   . ADHD (attention deficit hyperactivity disorder)   . Asthma     Patient Active Problem List   Diagnosis Date Noted  . Other specified behavioral and emotional disorders with onset usually occurring in childhood and adolescence 07/13/2015  . Acute myocardial infarction ([place]) 06/30/2015  . Acute asthma exacerbation 06/30/2015  . Airway hyperreactivity 06/30/2015  . Bronchitis 06/30/2015  . Disturbance of skin sensation 06/30/2015  . OP (osteoporosis) 06/30/2015  . Chronic pain 06/30/2015  . Postprocedural state 06/30/2015  . Pain in thoracic spine 06/30/2015  . Personal history of other diseases of the musculoskeletal system and connective tissue 06/30/2015  . Primary malignant neoplasm ([place]) 06/30/2015  . Apnea, sleep  06/30/2015  . Severe bipolar I disorder with depression ( [place]) 06/30/2015  . Controlled substance agreement signed 06/21/2015  . Other long term (current) drug therapy 06/21/2015  . HLD (hyperlipidemia) 05/13/2015  . Foot ulcer, left ([place]) 04/27/2015  . Chronic pain disorder 04/27/2015  . Chronic pain associated with significant psychosocial dysfunction 04/27/2015  . Non-pressure ulcer of lower extremity ([place]) 04/27/2015  . Stroke ([place]) 03/25/2015  . Cerebral infarction ([place]) 03/25/2015  . Bipolar I disorder, most recent episode depressed ([place]) 12/22/2014  . Morbid obesity ([place]) 12/22/2014  . Bipolar affective disorder, current episode depressed ([place]) 12/22/2014  . Affective bipolar disorder ([place]) 08/16/2014  . Gout 08/16/2014  . Extreme obesity ([place]) 08/16/2014  . Muscle spasms of both lower extremities 08/16/2014  . Spasm 08/16/2014  . Back pain, chronic 08/13/2014  . Low back pain with sciatica 08/04/2014  . Current tobacco use 08/04/2014  . Polysubstance abuse 07/04/2014  . Psychoactive substance abuse 07/04/2014  . Anxiety, generalized 11/24/2013  . Imbalance 11/24/2013  . Blurred vision 11/24/2013  . Cephalalgia 11/24/2013  . Other abnormalities of gait and mobility 11/24/2013  . Unspecified visual disturbance 11/24/2013  . COPD, moderate ([place]) 11/18/2013  . Moderate COPD (chronic obstructive pulmonary disease) ([place]) 11/18/2013  . Chronic obstructive pulmonary disease ([place]) 11/18/2013  . HPV (human papilloma virus) infection 07/17/2013  . Arthritis of knee, degenerative 07/15/2013  . H/O neoplasm 07/31/2011  . Personal history of other specified conditions 07/31/2011    Past Surgical History  Procedure Laterality Date  . Replacement total knee Left     Current Outpatient Rx  Name  Route  Sig  Dispense  Refill  . albuterol (PROVENTIL HFA;VENTOLIN HFA) 108 (90 Base) MCG/ACT inhaler   Inhalation   Inhale 2 puffs into the lungs every 6 (six) hours as needed for  wheezing or shortness of breath.   1 Inhaler   0   . albuterol (PROVENTIL) (2.5 MG/3ML) 0.083% nebulizer solution   Nebulization   Take 3 mLs (2.5 mg total) by nebulization every 4 (four) hours as needed for wheezing or shortness of breath.   150 mL   2     STOP ipratropium combo neb   . ALPRAZolam (XANAX) 0.5 MG tablet   Oral   Take 1 tablet (0.5 mg total) by mouth 3 (three) times daily.   90 tablet   1   . cyclobenzaprine (FLEXERIL) 5 MG tablet   Oral   Take 1 tablet (5 mg total) by mouth 3 (three) times daily as needed for muscle spasms.   90 tablet   2   . divalproex (DEPAKOTE) 250 MG DR tablet   Oral   Take 1 tablet (250 mg total) by mouth 2 (two) times daily.   60 tablet   1   . escitalopram (LEXAPRO) 20 MG tablet   Oral   Take 1 tablet (20 mg total) by mouth every other day. Patient taking differently: Take 20 mg by mouth daily.    30 tablet   2   . ketorolac (TORADOL) 10 MG tablet   Oral   Take 1 tablet (10 mg total) by mouth every 8 (eight) hours.   15 tablet   0   . lidocaine (XYLOCAINE) 5 % ointment   Topical   Apply 1 application topically as needed for mild pain.         Marland Kitchen LYRICA 200 MG capsule   Oral   Take 1 capsule (200 mg total) by mouth 2 (two) times daily.   60 capsule   5     Dispense as written.   . Multiple Vitamin (MULTIVITAMIN WITH MINERALS) TABS tablet   Oral   Take 1 tablet by mouth daily.         . naproxen (NAPROSYN) 375 MG tablet   Oral   Take 1 tablet (375 mg total) by mouth 2 (two) times daily with a meal. Take 81 mg coated aspirin daily one hour PRIOR to morning naproxen   60 tablet   5   . QUEtiapine (SEROQUEL XR) 300 MG 24 hr tablet   Oral   Take 1 tablet (300 mg total) by mouth at bedtime.   30 tablet   1   . traMADol (ULTRAM) 50 MG tablet   Oral   Take 1 tablet (50 mg total) by mouth every 12 (twelve) hours as needed.   60 tablet   0     Refill 04/27/15   . collagenase (SANTYL) ointment   Topical    Apply topically daily.   15 g   0   . furosemide (LASIX) 20 MG tablet   Oral   Take 1 tablet (20 mg total) by mouth daily.   3 tablet   0   . HYDROcodone-acetaminophen (NORCO) 5-325 MG tablet   Oral   Take 1 tablet by mouth every 6 (six) hours as needed for moderate pain.   15 tablet   0   . predniSONE (DELTASONE) 10 MG tablet      Take five pills by mouth on day 1, then 4 pills on day 2, then 3 pills on day 3, then  2 pills on day 4, and 1 on day 5; take with food Patient not taking: Reported on 07/16/2015   15 tablet   0     Allergies Codeine  Family History  Problem Relation Age of Onset  . Hernia Mother   . Heart disease Mother   . OCD Mother   . Diabetes Mother   . Parkinson's disease Father   . Bipolar disorder Sister   . Schizophrenia Sister   . ADD / ADHD Sister   . Alcohol abuse Brother   . Bipolar disorder Sister   . Paranoid behavior Sister   . ADD / ADHD Sister     Social History Social History  Substance Use Topics  . Smoking status: Current Every Day Smoker -- 0.50 packs/day    Types: Cigarettes    Start date: 10/11/1984  . Smokeless tobacco: Never Used  . Alcohol Use: No    Review of Systems  Constitutional: No fever/chills. Eyes: No visual changes. ENT: No sore throat. Cardiovascular: Denies chest pain. Respiratory: Denies shortness of breath. Gastrointestinal: No abdominal pain.  No nausea, no vomiting.  No diarrhea.  No constipation. Genitourinary: Negative for dysuria. Musculoskeletal: Positive for BLE edema. Negative for back pain. Skin: Negative for rash. Neurological: Negative for headaches, focal weakness or numbness.  10-point ROS otherwise negative.  ____________________________________________   PHYSICAL EXAM:  VITAL SIGNS: ED Triage Vitals  Enc Vitals Group     BP 07/15/15 2221 110/66 mmHg     Pulse Rate 07/15/15 2221 95     Resp 07/15/15 2221 20     Temp 07/15/15 2221 97.8 F (36.6 C)     Temp src --       SpO2 07/15/15 2221 94 %     Weight 07/15/15 2221 245 lb (111.131 kg)     Height 07/15/15 2221 5\' 1"  (1.549 m)     Head Cir --      Peak Flow --      Pain Score 07/15/15 2222 10     Pain Loc --      Pain Edu? --      Excl. in [place]? --     Constitutional: Asleep, easily awakened for exam. Alert and oriented. Well appearing and in no acute distress. Eyes: Conjunctivae are normal. PERRL. EOMI. Head: Atraumatic. Nose: No congestion/rhinnorhea. Mouth/Throat: Mucous membranes are moist.  Oropharynx non-erythematous. Neck: No stridor.   Cardiovascular: Normal rate, regular rhythm. Grossly normal heart sounds.  Good peripheral circulation. Respiratory: Normal respiratory effort.  No retractions. Lungs CTAB. Gastrointestinal: Soft and nontender. No distention. No abdominal bruits. No CVA tenderness. Musculoskeletal: 3+ nonpitting BLE edema symmetrically. Clean bandage to left foot which is not removed removed as home health nurse just dressed it this afternoon. Legs appear mildly erythematous but not warm. Calves are not rigid; no suspicion for compartment syndrome. Bilateral warm extremities without evidence for ischemia.  No joint effusions. Neurologic:  Normal speech and language. No gross focal neurologic deficits are appreciated.  Skin:  Skin is warm, dry and intact. No rash noted. Psychiatric: Mood and affect are normal. Speech and behavior are normal.  ____________________________________________   LABS (all labs ordered are listed, but only abnormal results are displayed)  Labs Reviewed  CBC WITH DIFFERENTIAL/PLATELET - Abnormal; Notable for the following:    RDW 17.7 (*)    All other components within normal limits  BASIC METABOLIC PANEL - Abnormal; Notable for the following:    Creatinine, Ser 1.08 (*)    Calcium  8.7 (*)    GFR calc non Af Amer 56 (*)    All other components within normal limits  BRAIN NATRIURETIC PEPTIDE - Abnormal; Notable for the following:    B Natriuretic  Peptide 157.0 (*)    All other components within normal limits   ____________________________________________  EKG  None ____________________________________________  RADIOLOGY  Doppler ultrasound interpreted per Dr. Gerilyn Nestle: No evidence of deep venous thrombosis. ____________________________________________   PROCEDURES  Procedure(s) performed: None  Critical Care performed: No  ____________________________________________   INITIAL IMPRESSION / ASSESSMENT AND PLAN / ED COURSE  Pertinent labs & imaging results that were available during my care of the patient were reviewed by me and considered in my medical decision making (see chart for details).  55 year old female who is currently on antibiotics for BLE cellulitis presents with increased pain secondary to increased swelling. Will obtain screening lab work including BNP, chest x-ray and obtain BLE Dopplers to evaluate for DVT. Will hold analgesia for now as patient fell asleep soon as the exam was over; she took her nighttime dose of Seroquel prior to arrival.  ----------------------------------------- 2:44 AM on 07/16/2015 -----------------------------------------  Delay secondary to ultrasound. Patient asleep in no acute distress.  ----------------------------------------- 6:45 AM on 07/16/2015 -----------------------------------------  Updated patient of ultrasound results negative for DVT. Advised patient to finish her antibiotic as prescribed by her doctor. Will prescribe a limited quantity of analgesia, place patient on Lasix 20 mg daily for the next 3 days, advised elevation of legs as often as possible. Patient will follow up closely with her PCP early next week. Strict return precautions given. Patient verbalizes understanding and agrees with plan of care. ____________________________________________   FINAL CLINICAL IMPRESSION(S) / ED DIAGNOSES  Final diagnoses:  Peripheral edema      NEW  MEDICATIONS STARTED DURING THIS VISIT:  New Prescriptions   FUROSEMIDE (LASIX) 20 MG TABLET    Take 1 tablet (20 mg total) by mouth daily.   HYDROCODONE-ACETAMINOPHEN (NORCO) 5-325 MG TABLET    Take 1 tablet by mouth every 6 (six) hours as needed for moderate pain.     Note:  This document was prepared using Dragon voice recognition software and may include unintentional dictation errors.    Paulette Blanch, MD 07/16/15 [phone]

## 2015-07-15 NOTE — Telephone Encounter (Signed)
Pt.notified

## 2015-07-15 NOTE — ED Notes (Signed)
Pt states she f/u w/ PCP on past Monday, started on abx by him which she has been taking. Pt has home health that comes to change bandages on L foot that is chronic. Pt was told by this home health RN that she should be seen for her pain in L leg. Pt states she is having increased generalized swelling and pain.

## 2015-07-15 NOTE — Telephone Encounter (Signed)
Nicole Austin from [facility] states patient was dx with cellulitis and list patients compliants: Swollen all over Feet swollen Not able to sleep in bed When putting blanket over feet it hurts Antibotics not helping [name] also states that she has not seen the patient today but [name] has called her. Also stated that normally when she calls she ends up in the hospital. Please advise

## 2015-07-16 ENCOUNTER — Emergency Department: Payer: Commercial Managed Care - HMO

## 2015-07-16 DIAGNOSIS — R600 Localized edema: Secondary | ICD-10-CM | POA: Diagnosis not present

## 2015-07-16 LAB — CBC WITH DIFFERENTIAL/PLATELET
BASOS PCT: 0 %
Basophils Absolute: 0 10*3/uL (ref 0–0.1)
EOS ABS: 0.5 10*3/uL (ref 0–0.7)
EOS PCT: 7 %
HCT: 36.2 % (ref 35.0–47.0)
HEMOGLOBIN: 12.2 g/dL (ref 12.0–16.0)
Lymphocytes Relative: 20 %
Lymphs Abs: 1.2 10*3/uL (ref 1.0–3.6)
MCH: 29.6 pg (ref 26.0–34.0)
MCHC: 33.8 g/dL (ref 32.0–36.0)
MCV: 87.5 fL (ref 80.0–100.0)
MONOS PCT: 5 %
Monocytes Absolute: 0.3 10*3/uL (ref 0.2–0.9)
NEUTROS PCT: 68 %
Neutro Abs: 4.2 10*3/uL (ref 1.4–6.5)
PLATELETS: 153 10*3/uL (ref 150–440)
RBC: 4.14 MIL/uL (ref 3.80–5.20)
RDW: 17.7 % — ABNORMAL HIGH (ref 11.5–14.5)
WBC: 6.3 10*3/uL (ref 3.6–11.0)

## 2015-07-16 LAB — BASIC METABOLIC PANEL
Anion gap: 7 (ref 5–15)
BUN: 13 mg/dL (ref 6–20)
CHLORIDE: 103 mmol/L (ref 101–111)
CO2: 28 mmol/L (ref 22–32)
Calcium: 8.7 mg/dL — ABNORMAL LOW (ref 8.9–10.3)
Creatinine, Ser: 1.08 mg/dL — ABNORMAL HIGH (ref 0.44–1.00)
GFR calc Af Amer: >60 mL/min (ref >60)
GFR, EST NON AFRICAN AMERICAN: 56 mL/min — AB (ref >60)
GLUCOSE: 90 mg/dL (ref 65–99)
POTASSIUM: 4.1 mmol/L (ref 3.5–5.1)
Sodium: 138 mmol/L (ref 135–145)

## 2015-07-16 LAB — BRAIN NATRIURETIC PEPTIDE: B NATRIURETIC PEPTIDE 5: 157 pg/mL — AB (ref 0.0–100.0)

## 2015-07-16 MED ORDER — FUROSEMIDE 20 MG PO TABS: 20.0000 mg | ORAL_TABLET | Freq: Every day | ORAL | Status: DC

## 2015-07-16 MED ORDER — HYDROCODONE-ACETAMINOPHEN 5-325 MG PO TABS: 1.0000 | ORAL_TABLET | Freq: Four times a day (QID) | ORAL | Status: DC | PRN

## 2015-07-16 NOTE — Discharge Instructions (Signed)
1. Continue to finish your antibiotic as prescribed by your doctor. 2. Take fluid pill (Lasix 20 mg) daily for the next 3 days. 3. Elevate legs whenever possible. 4. You may take pain medicine as needed (Norco #10). 5. Return to the ER for worsening symptoms, persistent vomiting, fever or other concerns.  Peripheral Edema You have swelling in your legs (peripheral edema). This swelling is due to excess accumulation of salt and water in your body. Edema may be a sign of heart, kidney or liver disease, or a side effect of a medication. It may also be due to problems in the leg veins. Elevating your legs and using special support stockings may be very helpful, if the cause of the swelling is due to poor venous circulation. Avoid long periods of standing, whatever the cause. Treatment of edema depends on identifying the cause. Chips, pretzels, pickles and other salty foods should be avoided. Restricting salt in your diet is almost always needed. Water pills (diuretics) are often used to remove the excess salt and water from your body via urine. These medicines prevent the kidney from reabsorbing sodium. This increases urine flow. Diuretic treatment may also result in lowering of potassium levels in your body. Potassium supplements may be needed if you have to use diuretics daily. Daily weights can help you keep track of your progress in clearing your edema. You should call your caregiver for follow up care as recommended. SEEK IMMEDIATE MEDICAL CARE IF:   You have increased swelling, pain, redness, or heat in your legs.  You develop shortness of breath, especially when lying down.  You develop chest or abdominal pain, weakness, or fainting.  You have a fever.   This information is not intended to replace advice given to you by your health care provider. Make sure you discuss any questions you have with your health care provider.   Document Released: 03/22/2004 Document Revised: 05/07/2011 Document  Reviewed: 08/25/2014 Elsevier Interactive Patient Education [employer].

## 2015-07-19 ENCOUNTER — Ambulatory Visit: Payer: Self-pay | Admitting: Family Medicine

## 2015-07-19 DIAGNOSIS — J449 Chronic obstructive pulmonary disease, unspecified: Secondary | ICD-10-CM | POA: Diagnosis not present

## 2015-07-19 DIAGNOSIS — G8929 Other chronic pain: Secondary | ICD-10-CM | POA: Diagnosis not present

## 2015-07-19 DIAGNOSIS — G629 Polyneuropathy, unspecified: Secondary | ICD-10-CM | POA: Diagnosis not present

## 2015-07-19 DIAGNOSIS — L97421 Non-pressure chronic ulcer of left heel and midfoot limited to breakdown of skin: Secondary | ICD-10-CM | POA: Diagnosis not present

## 2015-07-19 DIAGNOSIS — F319 Bipolar disorder, unspecified: Secondary | ICD-10-CM | POA: Diagnosis not present

## 2015-07-19 DIAGNOSIS — M1991 Primary osteoarthritis, unspecified site: Secondary | ICD-10-CM | POA: Diagnosis not present

## 2015-07-22 ENCOUNTER — Telehealth: Payer: Self-pay | Admitting: Family Medicine

## 2015-07-22 DIAGNOSIS — J449 Chronic obstructive pulmonary disease, unspecified: Secondary | ICD-10-CM | POA: Diagnosis not present

## 2015-07-22 DIAGNOSIS — F319 Bipolar disorder, unspecified: Secondary | ICD-10-CM | POA: Diagnosis not present

## 2015-07-22 DIAGNOSIS — M1991 Primary osteoarthritis, unspecified site: Secondary | ICD-10-CM | POA: Diagnosis not present

## 2015-07-22 DIAGNOSIS — L97421 Non-pressure chronic ulcer of left heel and midfoot limited to breakdown of skin: Secondary | ICD-10-CM | POA: Diagnosis not present

## 2015-07-22 DIAGNOSIS — G629 Polyneuropathy, unspecified: Secondary | ICD-10-CM | POA: Diagnosis not present

## 2015-07-22 DIAGNOSIS — G8929 Other chronic pain: Secondary | ICD-10-CM | POA: Diagnosis not present

## 2015-07-22 NOTE — Telephone Encounter (Signed)
She needs to go to urgent care please

## 2015-07-22 NOTE — Telephone Encounter (Signed)
Patient notified

## 2015-07-22 NOTE — Telephone Encounter (Signed)
Called to inform pt not voicemail setup, also called Leah from [facility] and left voicemail

## 2015-07-22 NOTE — Telephone Encounter (Signed)
Leah from [facility] states that patient has lower extremity edema and wheezing associated with cough. Has history of COPD. Patient was discharged from hospital on last Friday and is suppose to follow up with you. She does have appointment with you for June 2. Denny Peon feels that she need to be seen sooner. Would like to know if your able to see her sooner or call her something in. Patient uses [facility]. Your schedule is completely booked for today Please call Leah at [phone] and you can also give the patient a call as well.

## 2015-07-26 DIAGNOSIS — L97521 Non-pressure chronic ulcer of other part of left foot limited to breakdown of skin: Secondary | ICD-10-CM | POA: Diagnosis not present

## 2015-07-27 ENCOUNTER — Telehealth: Payer: Self-pay | Admitting: Family Medicine

## 2015-07-27 DIAGNOSIS — J449 Chronic obstructive pulmonary disease, unspecified: Secondary | ICD-10-CM

## 2015-07-27 NOTE — Telephone Encounter (Signed)
I received a form from a place called [facility] that patient wants to monitor her oxygen; I do not see anywhere on the chart that she is seeing a pulmonologist, but I may have overlooked it Please call her and let her know that I'd like her to have a lung specialist; referral entered; thank you

## 2015-07-27 NOTE — Telephone Encounter (Signed)
Left voice mail

## 2015-07-27 NOTE — Assessment & Plan Note (Signed)
Refer to pulmonologist 

## 2015-07-28 DIAGNOSIS — M1991 Primary osteoarthritis, unspecified site: Secondary | ICD-10-CM | POA: Diagnosis not present

## 2015-07-28 DIAGNOSIS — F319 Bipolar disorder, unspecified: Secondary | ICD-10-CM | POA: Diagnosis not present

## 2015-07-28 DIAGNOSIS — L97421 Non-pressure chronic ulcer of left heel and midfoot limited to breakdown of skin: Secondary | ICD-10-CM | POA: Diagnosis not present

## 2015-07-28 DIAGNOSIS — G8929 Other chronic pain: Secondary | ICD-10-CM | POA: Diagnosis not present

## 2015-07-28 DIAGNOSIS — J449 Chronic obstructive pulmonary disease, unspecified: Secondary | ICD-10-CM | POA: Diagnosis not present

## 2015-07-28 DIAGNOSIS — G629 Polyneuropathy, unspecified: Secondary | ICD-10-CM | POA: Diagnosis not present

## 2015-07-29 ENCOUNTER — Ambulatory Visit: Payer: Self-pay | Admitting: Family Medicine

## 2015-07-29 DIAGNOSIS — G8929 Other chronic pain: Secondary | ICD-10-CM | POA: Diagnosis not present

## 2015-07-29 DIAGNOSIS — F319 Bipolar disorder, unspecified: Secondary | ICD-10-CM | POA: Diagnosis not present

## 2015-07-29 DIAGNOSIS — G629 Polyneuropathy, unspecified: Secondary | ICD-10-CM | POA: Diagnosis not present

## 2015-07-29 DIAGNOSIS — L97421 Non-pressure chronic ulcer of left heel and midfoot limited to breakdown of skin: Secondary | ICD-10-CM | POA: Diagnosis not present

## 2015-07-29 DIAGNOSIS — J449 Chronic obstructive pulmonary disease, unspecified: Secondary | ICD-10-CM | POA: Diagnosis not present

## 2015-07-29 DIAGNOSIS — M1991 Primary osteoarthritis, unspecified site: Secondary | ICD-10-CM | POA: Diagnosis not present

## 2015-07-30 DIAGNOSIS — L97421 Non-pressure chronic ulcer of left heel and midfoot limited to breakdown of skin: Secondary | ICD-10-CM | POA: Diagnosis not present

## 2015-07-30 DIAGNOSIS — G8929 Other chronic pain: Secondary | ICD-10-CM | POA: Diagnosis not present

## 2015-07-30 DIAGNOSIS — J449 Chronic obstructive pulmonary disease, unspecified: Secondary | ICD-10-CM | POA: Diagnosis not present

## 2015-07-30 DIAGNOSIS — M1991 Primary osteoarthritis, unspecified site: Secondary | ICD-10-CM | POA: Diagnosis not present

## 2015-07-30 DIAGNOSIS — F319 Bipolar disorder, unspecified: Secondary | ICD-10-CM | POA: Diagnosis not present

## 2015-07-30 DIAGNOSIS — G629 Polyneuropathy, unspecified: Secondary | ICD-10-CM | POA: Diagnosis not present

## 2015-07-31 DIAGNOSIS — G629 Polyneuropathy, unspecified: Secondary | ICD-10-CM | POA: Diagnosis not present

## 2015-07-31 DIAGNOSIS — M1991 Primary osteoarthritis, unspecified site: Secondary | ICD-10-CM | POA: Diagnosis not present

## 2015-07-31 DIAGNOSIS — F319 Bipolar disorder, unspecified: Secondary | ICD-10-CM | POA: Diagnosis not present

## 2015-07-31 DIAGNOSIS — L97421 Non-pressure chronic ulcer of left heel and midfoot limited to breakdown of skin: Secondary | ICD-10-CM | POA: Diagnosis not present

## 2015-07-31 DIAGNOSIS — J449 Chronic obstructive pulmonary disease, unspecified: Secondary | ICD-10-CM | POA: Diagnosis not present

## 2015-07-31 DIAGNOSIS — G8929 Other chronic pain: Secondary | ICD-10-CM | POA: Diagnosis not present

## 2015-08-01 DIAGNOSIS — L97521 Non-pressure chronic ulcer of other part of left foot limited to breakdown of skin: Secondary | ICD-10-CM | POA: Diagnosis not present

## 2015-08-02 ENCOUNTER — Other Ambulatory Visit: Payer: Self-pay | Admitting: Psychiatry

## 2015-08-02 DIAGNOSIS — J449 Chronic obstructive pulmonary disease, unspecified: Secondary | ICD-10-CM | POA: Diagnosis not present

## 2015-08-02 DIAGNOSIS — G629 Polyneuropathy, unspecified: Secondary | ICD-10-CM | POA: Diagnosis not present

## 2015-08-02 DIAGNOSIS — L97421 Non-pressure chronic ulcer of left heel and midfoot limited to breakdown of skin: Secondary | ICD-10-CM | POA: Diagnosis not present

## 2015-08-02 DIAGNOSIS — F319 Bipolar disorder, unspecified: Secondary | ICD-10-CM | POA: Diagnosis not present

## 2015-08-02 DIAGNOSIS — L89629 Pressure ulcer of left heel, unspecified stage: Secondary | ICD-10-CM | POA: Diagnosis not present

## 2015-08-02 DIAGNOSIS — M1991 Primary osteoarthritis, unspecified site: Secondary | ICD-10-CM | POA: Diagnosis not present

## 2015-08-02 DIAGNOSIS — G8929 Other chronic pain: Secondary | ICD-10-CM | POA: Diagnosis not present

## 2015-08-03 DIAGNOSIS — M1991 Primary osteoarthritis, unspecified site: Secondary | ICD-10-CM | POA: Diagnosis not present

## 2015-08-03 DIAGNOSIS — J449 Chronic obstructive pulmonary disease, unspecified: Secondary | ICD-10-CM | POA: Diagnosis not present

## 2015-08-03 DIAGNOSIS — L97421 Non-pressure chronic ulcer of left heel and midfoot limited to breakdown of skin: Secondary | ICD-10-CM | POA: Diagnosis not present

## 2015-08-03 DIAGNOSIS — G629 Polyneuropathy, unspecified: Secondary | ICD-10-CM | POA: Diagnosis not present

## 2015-08-03 DIAGNOSIS — G8929 Other chronic pain: Secondary | ICD-10-CM | POA: Diagnosis not present

## 2015-08-03 DIAGNOSIS — F319 Bipolar disorder, unspecified: Secondary | ICD-10-CM | POA: Diagnosis not present

## 2015-08-04 DIAGNOSIS — J449 Chronic obstructive pulmonary disease, unspecified: Secondary | ICD-10-CM | POA: Diagnosis not present

## 2015-08-04 DIAGNOSIS — G629 Polyneuropathy, unspecified: Secondary | ICD-10-CM | POA: Diagnosis not present

## 2015-08-04 DIAGNOSIS — M1991 Primary osteoarthritis, unspecified site: Secondary | ICD-10-CM | POA: Diagnosis not present

## 2015-08-04 DIAGNOSIS — L97421 Non-pressure chronic ulcer of left heel and midfoot limited to breakdown of skin: Secondary | ICD-10-CM | POA: Diagnosis not present

## 2015-08-04 DIAGNOSIS — G8929 Other chronic pain: Secondary | ICD-10-CM | POA: Diagnosis not present

## 2015-08-04 DIAGNOSIS — F319 Bipolar disorder, unspecified: Secondary | ICD-10-CM | POA: Diagnosis not present

## 2015-08-05 DIAGNOSIS — G8929 Other chronic pain: Secondary | ICD-10-CM | POA: Diagnosis not present

## 2015-08-05 DIAGNOSIS — F319 Bipolar disorder, unspecified: Secondary | ICD-10-CM | POA: Diagnosis not present

## 2015-08-05 DIAGNOSIS — J449 Chronic obstructive pulmonary disease, unspecified: Secondary | ICD-10-CM | POA: Diagnosis not present

## 2015-08-05 DIAGNOSIS — M1991 Primary osteoarthritis, unspecified site: Secondary | ICD-10-CM | POA: Diagnosis not present

## 2015-08-05 DIAGNOSIS — L97421 Non-pressure chronic ulcer of left heel and midfoot limited to breakdown of skin: Secondary | ICD-10-CM | POA: Diagnosis not present

## 2015-08-05 DIAGNOSIS — G629 Polyneuropathy, unspecified: Secondary | ICD-10-CM | POA: Diagnosis not present

## 2015-08-06 DIAGNOSIS — M1991 Primary osteoarthritis, unspecified site: Secondary | ICD-10-CM | POA: Diagnosis not present

## 2015-08-06 DIAGNOSIS — F319 Bipolar disorder, unspecified: Secondary | ICD-10-CM | POA: Diagnosis not present

## 2015-08-06 DIAGNOSIS — G8929 Other chronic pain: Secondary | ICD-10-CM | POA: Diagnosis not present

## 2015-08-06 DIAGNOSIS — L97421 Non-pressure chronic ulcer of left heel and midfoot limited to breakdown of skin: Secondary | ICD-10-CM | POA: Diagnosis not present

## 2015-08-06 DIAGNOSIS — G629 Polyneuropathy, unspecified: Secondary | ICD-10-CM | POA: Diagnosis not present

## 2015-08-06 DIAGNOSIS — J449 Chronic obstructive pulmonary disease, unspecified: Secondary | ICD-10-CM | POA: Diagnosis not present

## 2015-08-07 DIAGNOSIS — L97421 Non-pressure chronic ulcer of left heel and midfoot limited to breakdown of skin: Secondary | ICD-10-CM | POA: Diagnosis not present

## 2015-08-07 DIAGNOSIS — M1991 Primary osteoarthritis, unspecified site: Secondary | ICD-10-CM | POA: Diagnosis not present

## 2015-08-07 DIAGNOSIS — G629 Polyneuropathy, unspecified: Secondary | ICD-10-CM | POA: Diagnosis not present

## 2015-08-07 DIAGNOSIS — F319 Bipolar disorder, unspecified: Secondary | ICD-10-CM | POA: Diagnosis not present

## 2015-08-07 DIAGNOSIS — G8929 Other chronic pain: Secondary | ICD-10-CM | POA: Diagnosis not present

## 2015-08-07 DIAGNOSIS — J449 Chronic obstructive pulmonary disease, unspecified: Secondary | ICD-10-CM | POA: Diagnosis not present

## 2015-08-08 ENCOUNTER — Telehealth: Payer: Self-pay | Admitting: Family Medicine

## 2015-08-08 DIAGNOSIS — G8929 Other chronic pain: Secondary | ICD-10-CM | POA: Diagnosis not present

## 2015-08-08 DIAGNOSIS — M1991 Primary osteoarthritis, unspecified site: Secondary | ICD-10-CM | POA: Diagnosis not present

## 2015-08-08 DIAGNOSIS — F319 Bipolar disorder, unspecified: Secondary | ICD-10-CM | POA: Diagnosis not present

## 2015-08-08 DIAGNOSIS — J449 Chronic obstructive pulmonary disease, unspecified: Secondary | ICD-10-CM | POA: Diagnosis not present

## 2015-08-08 DIAGNOSIS — L97421 Non-pressure chronic ulcer of left heel and midfoot limited to breakdown of skin: Secondary | ICD-10-CM | POA: Diagnosis not present

## 2015-08-08 DIAGNOSIS — G629 Polyneuropathy, unspecified: Secondary | ICD-10-CM | POA: Diagnosis not present

## 2015-08-08 NOTE — Telephone Encounter (Signed)
PT NEED [employer] REF FOR PIODOTRY TO AT [place]. APPT IS August 10 2015 . CODE IS [id number]

## 2015-08-08 NOTE — Telephone Encounter (Signed)
Tiffany can you please do this for me?

## 2015-08-09 DIAGNOSIS — G629 Polyneuropathy, unspecified: Secondary | ICD-10-CM | POA: Diagnosis not present

## 2015-08-09 DIAGNOSIS — F319 Bipolar disorder, unspecified: Secondary | ICD-10-CM | POA: Diagnosis not present

## 2015-08-09 DIAGNOSIS — G8929 Other chronic pain: Secondary | ICD-10-CM | POA: Diagnosis not present

## 2015-08-09 DIAGNOSIS — M1991 Primary osteoarthritis, unspecified site: Secondary | ICD-10-CM | POA: Diagnosis not present

## 2015-08-09 DIAGNOSIS — J449 Chronic obstructive pulmonary disease, unspecified: Secondary | ICD-10-CM | POA: Diagnosis not present

## 2015-08-09 DIAGNOSIS — L97421 Non-pressure chronic ulcer of left heel and midfoot limited to breakdown of skin: Secondary | ICD-10-CM | POA: Diagnosis not present

## 2015-08-09 NOTE — Telephone Encounter (Signed)
Referral for Nicole Austin has been submitted and was approved authorization number is [id number] valid from 08/09/15 through 02/05/16 for 6 visits.

## 2015-08-10 DIAGNOSIS — L97512 Non-pressure chronic ulcer of other part of right foot with fat layer exposed: Secondary | ICD-10-CM | POA: Diagnosis not present

## 2015-08-11 DIAGNOSIS — M1991 Primary osteoarthritis, unspecified site: Secondary | ICD-10-CM | POA: Diagnosis not present

## 2015-08-11 DIAGNOSIS — J449 Chronic obstructive pulmonary disease, unspecified: Secondary | ICD-10-CM | POA: Diagnosis not present

## 2015-08-11 DIAGNOSIS — G8929 Other chronic pain: Secondary | ICD-10-CM | POA: Diagnosis not present

## 2015-08-11 DIAGNOSIS — G629 Polyneuropathy, unspecified: Secondary | ICD-10-CM | POA: Diagnosis not present

## 2015-08-11 DIAGNOSIS — F319 Bipolar disorder, unspecified: Secondary | ICD-10-CM | POA: Diagnosis not present

## 2015-08-11 DIAGNOSIS — L97421 Non-pressure chronic ulcer of left heel and midfoot limited to breakdown of skin: Secondary | ICD-10-CM | POA: Diagnosis not present

## 2015-08-12 ENCOUNTER — Other Ambulatory Visit: Payer: Self-pay | Admitting: Psychiatry

## 2015-08-12 DIAGNOSIS — G8929 Other chronic pain: Secondary | ICD-10-CM | POA: Diagnosis not present

## 2015-08-12 DIAGNOSIS — G629 Polyneuropathy, unspecified: Secondary | ICD-10-CM | POA: Diagnosis not present

## 2015-08-12 DIAGNOSIS — M1991 Primary osteoarthritis, unspecified site: Secondary | ICD-10-CM | POA: Diagnosis not present

## 2015-08-12 DIAGNOSIS — L97421 Non-pressure chronic ulcer of left heel and midfoot limited to breakdown of skin: Secondary | ICD-10-CM | POA: Diagnosis not present

## 2015-08-12 DIAGNOSIS — F319 Bipolar disorder, unspecified: Secondary | ICD-10-CM | POA: Diagnosis not present

## 2015-08-12 DIAGNOSIS — J449 Chronic obstructive pulmonary disease, unspecified: Secondary | ICD-10-CM | POA: Diagnosis not present

## 2015-08-13 DIAGNOSIS — G8929 Other chronic pain: Secondary | ICD-10-CM | POA: Diagnosis not present

## 2015-08-13 DIAGNOSIS — G629 Polyneuropathy, unspecified: Secondary | ICD-10-CM | POA: Diagnosis not present

## 2015-08-13 DIAGNOSIS — M1991 Primary osteoarthritis, unspecified site: Secondary | ICD-10-CM | POA: Diagnosis not present

## 2015-08-13 DIAGNOSIS — J449 Chronic obstructive pulmonary disease, unspecified: Secondary | ICD-10-CM | POA: Diagnosis not present

## 2015-08-13 DIAGNOSIS — L97421 Non-pressure chronic ulcer of left heel and midfoot limited to breakdown of skin: Secondary | ICD-10-CM | POA: Diagnosis not present

## 2015-08-13 DIAGNOSIS — F319 Bipolar disorder, unspecified: Secondary | ICD-10-CM | POA: Diagnosis not present

## 2015-08-14 DIAGNOSIS — L97421 Non-pressure chronic ulcer of left heel and midfoot limited to breakdown of skin: Secondary | ICD-10-CM | POA: Diagnosis not present

## 2015-08-14 DIAGNOSIS — M1991 Primary osteoarthritis, unspecified site: Secondary | ICD-10-CM | POA: Diagnosis not present

## 2015-08-14 DIAGNOSIS — G8929 Other chronic pain: Secondary | ICD-10-CM | POA: Diagnosis not present

## 2015-08-14 DIAGNOSIS — F319 Bipolar disorder, unspecified: Secondary | ICD-10-CM | POA: Diagnosis not present

## 2015-08-14 DIAGNOSIS — J449 Chronic obstructive pulmonary disease, unspecified: Secondary | ICD-10-CM | POA: Diagnosis not present

## 2015-08-14 DIAGNOSIS — G629 Polyneuropathy, unspecified: Secondary | ICD-10-CM | POA: Diagnosis not present

## 2015-08-15 DIAGNOSIS — F319 Bipolar disorder, unspecified: Secondary | ICD-10-CM | POA: Diagnosis not present

## 2015-08-15 DIAGNOSIS — G629 Polyneuropathy, unspecified: Secondary | ICD-10-CM | POA: Diagnosis not present

## 2015-08-15 DIAGNOSIS — G8929 Other chronic pain: Secondary | ICD-10-CM | POA: Diagnosis not present

## 2015-08-15 DIAGNOSIS — J449 Chronic obstructive pulmonary disease, unspecified: Secondary | ICD-10-CM | POA: Diagnosis not present

## 2015-08-15 DIAGNOSIS — M1991 Primary osteoarthritis, unspecified site: Secondary | ICD-10-CM | POA: Diagnosis not present

## 2015-08-15 DIAGNOSIS — L97421 Non-pressure chronic ulcer of left heel and midfoot limited to breakdown of skin: Secondary | ICD-10-CM | POA: Diagnosis not present

## 2015-08-16 DIAGNOSIS — F319 Bipolar disorder, unspecified: Secondary | ICD-10-CM | POA: Diagnosis not present

## 2015-08-16 DIAGNOSIS — G629 Polyneuropathy, unspecified: Secondary | ICD-10-CM | POA: Diagnosis not present

## 2015-08-16 DIAGNOSIS — G8929 Other chronic pain: Secondary | ICD-10-CM | POA: Diagnosis not present

## 2015-08-16 DIAGNOSIS — L97421 Non-pressure chronic ulcer of left heel and midfoot limited to breakdown of skin: Secondary | ICD-10-CM | POA: Diagnosis not present

## 2015-08-16 DIAGNOSIS — J449 Chronic obstructive pulmonary disease, unspecified: Secondary | ICD-10-CM | POA: Diagnosis not present

## 2015-08-16 DIAGNOSIS — M1991 Primary osteoarthritis, unspecified site: Secondary | ICD-10-CM | POA: Diagnosis not present

## 2015-08-17 DIAGNOSIS — G629 Polyneuropathy, unspecified: Secondary | ICD-10-CM | POA: Diagnosis not present

## 2015-08-17 DIAGNOSIS — L97421 Non-pressure chronic ulcer of left heel and midfoot limited to breakdown of skin: Secondary | ICD-10-CM | POA: Diagnosis not present

## 2015-08-17 DIAGNOSIS — J449 Chronic obstructive pulmonary disease, unspecified: Secondary | ICD-10-CM | POA: Diagnosis not present

## 2015-08-17 DIAGNOSIS — G8929 Other chronic pain: Secondary | ICD-10-CM | POA: Diagnosis not present

## 2015-08-17 DIAGNOSIS — M1991 Primary osteoarthritis, unspecified site: Secondary | ICD-10-CM | POA: Diagnosis not present

## 2015-08-17 DIAGNOSIS — F319 Bipolar disorder, unspecified: Secondary | ICD-10-CM | POA: Diagnosis not present

## 2015-08-19 DIAGNOSIS — G629 Polyneuropathy, unspecified: Secondary | ICD-10-CM | POA: Diagnosis not present

## 2015-08-19 DIAGNOSIS — L97421 Non-pressure chronic ulcer of left heel and midfoot limited to breakdown of skin: Secondary | ICD-10-CM | POA: Diagnosis not present

## 2015-08-19 DIAGNOSIS — J449 Chronic obstructive pulmonary disease, unspecified: Secondary | ICD-10-CM | POA: Diagnosis not present

## 2015-08-19 DIAGNOSIS — G8929 Other chronic pain: Secondary | ICD-10-CM | POA: Diagnosis not present

## 2015-08-19 DIAGNOSIS — F319 Bipolar disorder, unspecified: Secondary | ICD-10-CM | POA: Diagnosis not present

## 2015-08-19 DIAGNOSIS — M1991 Primary osteoarthritis, unspecified site: Secondary | ICD-10-CM | POA: Diagnosis not present

## 2015-08-22 DIAGNOSIS — J449 Chronic obstructive pulmonary disease, unspecified: Secondary | ICD-10-CM | POA: Diagnosis not present

## 2015-08-22 DIAGNOSIS — G8929 Other chronic pain: Secondary | ICD-10-CM | POA: Diagnosis not present

## 2015-08-22 DIAGNOSIS — M1991 Primary osteoarthritis, unspecified site: Secondary | ICD-10-CM | POA: Diagnosis not present

## 2015-08-22 DIAGNOSIS — L97421 Non-pressure chronic ulcer of left heel and midfoot limited to breakdown of skin: Secondary | ICD-10-CM | POA: Diagnosis not present

## 2015-08-22 DIAGNOSIS — F319 Bipolar disorder, unspecified: Secondary | ICD-10-CM | POA: Diagnosis not present

## 2015-08-22 DIAGNOSIS — G629 Polyneuropathy, unspecified: Secondary | ICD-10-CM | POA: Diagnosis not present

## 2015-08-24 DIAGNOSIS — L97512 Non-pressure chronic ulcer of other part of right foot with fat layer exposed: Secondary | ICD-10-CM | POA: Diagnosis not present

## 2015-08-26 DIAGNOSIS — F319 Bipolar disorder, unspecified: Secondary | ICD-10-CM | POA: Diagnosis not present

## 2015-08-26 DIAGNOSIS — G8929 Other chronic pain: Secondary | ICD-10-CM | POA: Diagnosis not present

## 2015-08-26 DIAGNOSIS — J449 Chronic obstructive pulmonary disease, unspecified: Secondary | ICD-10-CM | POA: Diagnosis not present

## 2015-08-26 DIAGNOSIS — M1991 Primary osteoarthritis, unspecified site: Secondary | ICD-10-CM | POA: Diagnosis not present

## 2015-08-26 DIAGNOSIS — G629 Polyneuropathy, unspecified: Secondary | ICD-10-CM | POA: Diagnosis not present

## 2015-08-26 DIAGNOSIS — L97421 Non-pressure chronic ulcer of left heel and midfoot limited to breakdown of skin: Secondary | ICD-10-CM | POA: Diagnosis not present

## 2015-08-29 ENCOUNTER — Encounter: Payer: Self-pay | Admitting: Family Medicine

## 2015-08-29 ENCOUNTER — Ambulatory Visit (INDEPENDENT_AMBULATORY_CARE_PROVIDER_SITE_OTHER): Payer: Commercial Managed Care - HMO | Admitting: Family Medicine

## 2015-08-29 VITALS — BP 130/82 | HR 120 | Temp 98.2°F | Resp 18 | Ht 61.0 in | Wt 260.0 lb

## 2015-08-29 DIAGNOSIS — M1991 Primary osteoarthritis, unspecified site: Secondary | ICD-10-CM | POA: Diagnosis not present

## 2015-08-29 DIAGNOSIS — Z720 Tobacco use: Secondary | ICD-10-CM

## 2015-08-29 DIAGNOSIS — L97421 Non-pressure chronic ulcer of left heel and midfoot limited to breakdown of skin: Secondary | ICD-10-CM | POA: Diagnosis not present

## 2015-08-29 DIAGNOSIS — F319 Bipolar disorder, unspecified: Secondary | ICD-10-CM | POA: Diagnosis not present

## 2015-08-29 DIAGNOSIS — M797 Fibromyalgia: Secondary | ICD-10-CM

## 2015-08-29 DIAGNOSIS — G894 Chronic pain syndrome: Secondary | ICD-10-CM | POA: Diagnosis not present

## 2015-08-29 DIAGNOSIS — G629 Polyneuropathy, unspecified: Secondary | ICD-10-CM | POA: Diagnosis not present

## 2015-08-29 DIAGNOSIS — G8929 Other chronic pain: Secondary | ICD-10-CM | POA: Diagnosis not present

## 2015-08-29 DIAGNOSIS — K219 Gastro-esophageal reflux disease without esophagitis: Secondary | ICD-10-CM

## 2015-08-29 DIAGNOSIS — E786 Lipoprotein deficiency: Secondary | ICD-10-CM | POA: Diagnosis not present

## 2015-08-29 DIAGNOSIS — L97911 Non-pressure chronic ulcer of unspecified part of right lower leg limited to breakdown of skin: Secondary | ICD-10-CM | POA: Diagnosis not present

## 2015-08-29 DIAGNOSIS — J449 Chronic obstructive pulmonary disease, unspecified: Secondary | ICD-10-CM | POA: Diagnosis not present

## 2015-08-29 MED ORDER — LYRICA 200 MG PO CAPS: 200.0000 mg | ORAL_CAPSULE | Freq: Two times a day (BID) | ORAL | Status: DC

## 2015-08-29 MED ORDER — RANITIDINE HCL 150 MG PO TABS: 150.0000 mg | ORAL_TABLET | Freq: Two times a day (BID) | ORAL | Status: DC

## 2015-08-29 NOTE — Patient Instructions (Signed)
Do not take both naproxen and ibuprofen -- they do the same thing and can hurt your kidneys  Please return for visit and fasting labs at the end of the July or early August  Try to limit saturated fats in your diet (bologna, hot dogs, barbeque, cheeseburgers, hamburgers, steak, bacon, sausage, cheese, etc.) and get more fresh fruits, vegetables, and whole grains  I do encourage you to quit smoking Call [phone] to sign up for smoking cessation classes You can call 1-800-QUIT-NOW to talk with a smoking cessation coach

## 2015-08-29 NOTE — Progress Notes (Signed)
BP 130/82 mmHg  Pulse 120  Temp(Src) 98.2 F (36.8 C) (Oral)  Resp 18  Ht 5\' 1"  (1.549 m)  Wt 260 lb (117.935 kg)  BMI 49.15 kg/m2  SpO2 94%  LMP  (LMP Unknown)   Subjective:    Patient ID: Nicole Austin, female    DOB: 02/05/1961, 55 y.o.   MRN: [id number]  HPI: Nicole Austin is a 55 y.o. female  Chief Complaint  Patient presents with  . Anxiety    medication refills follow up  . Hyperlipidemia  . Foot Swelling    pain med refills   She is dealing with Dr. Elvina Mattes; she has "gangrene" of her 4th and 5th toes of her right foot; nurse coming today at 4:30 pm today to check toes and do dressing change; she has a round cushion thing on her heel but she did not wear it today; she is not having any fevers; goes back to see Dr. Elvina Mattes next Wednesday; they are hoping to not have to amputate; on doxycycline 100 mg BID   She is having swelling in her feet; dryness of the legs  High cholesterol; HDL 28; we talked about weight loss; she has been able to lose weight with walking, but can't walk; she does not want to have labs drawn today; ALT 10 in April; no muscle aches on the atoravastatin; does not eat much cheese; not many processed meats  Lab Results  Component Value Date   CHOL 160 03/26/2015   CHOL 163 07/03/2014   CHOL 169 10/21/2013   Lab Results  Component Value Date   HDL 28* 03/26/2015   HDL 40* 07/03/2014   HDL 35* 10/21/2013   Lab Results  Component Value Date   LDLCALC 102* 03/26/2015   LDLCALC 108* 07/03/2014   LDLCALC 98 10/21/2013   Lab Results  Component Value Date   TRIG 152* 03/26/2015   TRIG 75 07/03/2014   TRIG 182 10/21/2013   Lab Results  Component Value Date   CHOLHDL 5.7 03/26/2015   CHOLHDL 4.1 07/03/2014   No results found for: LDLDIRECT  She is staying elsewhere and having to get her medicines forwarded from [place] to [place]  She needs refill of the Lyrica; she has fibromyalgia; she does think that the  medicine makes a difference; helps a little bit; medicine does not make her feel loopy or goofy  Went to pain clinic, Dr. Idelia Salm was giving her cyclobenzaprine; pain medicine coming from foot doctor now; not able to get to pain clinic so pain meds coming from foot doctor  She was in the hospital in May; labs reviewed   Depression screen [facility] 2/9 08/29/2015 06/21/2015 01/31/2015 01/12/2015 12/10/2014  Decreased Interest 0 1 2 0 3  Down, Depressed, Hopeless 0 1 3 0 3  PHQ - 2 Score 0 2 5 0 6  Altered sleeping - 0 2 - 3  Tired, decreased energy - 2 1 - 3  Change in appetite - 2 0 - 3  Feeling bad or failure about yourself  - 0 1 - 3  Trouble concentrating - 1 1 - 3  Moving slowly or fidgety/restless - 0 1 - 3  Suicidal thoughts - 0 0 - 0  PHQ-9 Score - 7 11 - 24  Difficult doing work/chores - Somewhat difficult - - Extremely dIfficult   Relevant past medical, surgical, family and social history reviewed Past Medical History  Diagnosis Date  . COPD (chronic obstructive pulmonary disease) ([place])   .  PTSD (post-traumatic stress disorder)   . Rhabdomyolysis   . Bipolar 1 disorder ([place])   . Arthritis   . Stroke ([place])   . MI (myocardial infarction) ([place])   . Depression   . ADHD (attention deficit hyperactivity disorder)   . Asthma   . Low HDL (under 40) 08/30/2015  . GERD (gastroesophageal reflux disease) 08/30/2015   Past Surgical History  Procedure Laterality Date  . Replacement total knee Left    Family History  Problem Relation Age of Onset  . Hernia Mother   . Heart disease Mother   . OCD Mother   . Diabetes Mother   . Parkinson's disease Father   . Bipolar disorder Sister   . Schizophrenia Sister   . ADD / ADHD Sister   . Alcohol abuse Brother   . Bipolar disorder Sister   . Paranoid behavior Sister   . ADD / ADHD Sister    Social History  Substance Use Topics  . Smoking status: Current Every Day Smoker -- 0.50 packs/day    Types: Cigarettes    Start date: 10/11/1984  .  Smokeless tobacco: Never Used  . Alcohol Use: No   Interim medical history since last visit reviewed. Allergies and medications reviewed  Review of Systems Per HPI unless specifically indicated above     Objective:    BP 130/82 mmHg  Pulse 120  Temp(Src) 98.2 F (36.8 C) (Oral)  Resp 18  Ht 5\' 1"  (1.549 m)  Wt 260 lb (117.935 kg)  BMI 49.15 kg/m2  SpO2 94%  LMP  (LMP Unknown)  Wt Readings from Last 3 Encounters:  08/29/15 260 lb (117.935 kg)  07/15/15 245 lb (111.131 kg)  07/08/15 245 lb (111.131 kg)   body mass index is 49.15 kg/(m^2).  Physical Exam  Constitutional: She appears well-developed and well-nourished.  Morbidly obese  HENT:  Mouth/Throat: Mucous membranes are normal.  Eyes: EOM are normal. No scleral icterus.  Cardiovascular: Regular rhythm.   No extrasystoles are present. Tachycardia present.   Rate just above 100 during auscultation  Pulmonary/Chest: Effort normal. No accessory muscle usage. She has no decreased breath sounds. She has no wheezes (expiratory wheezes). She has no rhonchi. She has no rales.  Musculoskeletal:       Right foot: There is tenderness. There is no swelling.  Wearing hard soled shoe on right foot; bandages removed; ulcerated lesions on 4th and 5th toes; no evidence of gangrene; granulation tissue present around edges; some foul smell noted, yellowish drainage adherent to bandage; new sterile gauze applied between toes, gently wrapped, nurse coming to dress wounds in about 2 hours  Skin: She is not diaphoretic. No pallor.  Dry skin, almost ichthyotic on shins  Psychiatric: Her behavior is normal. Her mood appears not anxious. Her affect is not blunt and not inappropriate. Her speech is not rapid and/or pressured. She does not exhibit a depressed mood.  Good eye contact with examiner; polite and cooperative   Results for orders placed or performed during the hospital encounter of 07/15/15  CBC with Differential  Result Value Ref  Range   WBC 6.3 3.6 - 11.0 K/uL   RBC 4.14 3.80 - 5.20 MIL/uL   Hemoglobin 12.2 12.0 - 16.0 g/dL   HCT 36.2 35.0 - 47.0 %   MCV 87.5 80.0 - 100.0 fL   MCH 29.6 26.0 - 34.0 pg   MCHC 33.8 32.0 - 36.0 g/dL   RDW 17.7 (H) 11.5 - 14.5 %   Platelets 153  150 - 440 K/uL   Neutrophils Relative % 68 %   Neutro Abs 4.2 1.4 - 6.5 K/uL   Lymphocytes Relative 20 %   Lymphs Abs 1.2 1.0 - 3.6 K/uL   Monocytes Relative 5 %   Monocytes Absolute 0.3 0.2 - 0.9 K/uL   Eosinophils Relative 7 %   Eosinophils Absolute 0.5 0 - 0.7 K/uL   Basophils Relative 0 %   Basophils Absolute 0.0 0 - 0.1 K/uL  Basic metabolic panel  Result Value Ref Range   Sodium 138 135 - 145 mmol/L   Potassium 4.1 3.5 - 5.1 mmol/L   Chloride 103 101 - 111 mmol/L   CO2 28 22 - 32 mmol/L   Glucose, Bld 90 65 - 99 mg/dL   BUN 13 6 - 20 mg/dL   Creatinine, Ser 1.08 (H) 0.44 - 1.00 mg/dL   Calcium 8.7 (L) 8.9 - 10.3 mg/dL   GFR calc non Af Amer 56 (L) >60 mL/min   GFR calc Af Amer >60 >60 mL/min   Anion gap 7 5 - 15  Brain natriuretic peptide  Result Value Ref Range   B Natriuretic Peptide 157.0 (H) 0.0 - 100.0 pg/mL      Assessment & Plan:   Problem List Items Addressed This Visit      Digestive   GERD (gastroesophageal reflux disease)    Risks associated with long-term use of PPIs; in absence of red flags, recommend she use BID H2 blocker      Relevant Medications   ranitidine (ZANTAC) 150 MG tablet     Musculoskeletal and Integument   Non-pressure ulcer of lower extremity (HCC)    Two ulcers on the 4th and 5th toes right foot; under the care of podiatrist; dressed with sterile gauze; home health nurse coming out in 2 hours to redress and monitor; continue antibiotics per podiatrist; no pain medicine written from this office, podiatrist writing pain medicine      Fibromyalgia    Reviewed [place] website; no other prescribers for Lyrica; no early fills; Rx supplied; she is on an SSRI per her psychiatrist, though  the bottle says to take every other day; I encouraged her to contact her psychiatrist right away to find out if that is correct, as I've not seen Lexapro dosed every other day; patient agrees to call her        Other   Morbid obesity ([place])    We talked about her weight gain; she has limited mobility with her foot ulcers, so I urged her to really watch her dietary intake      Low HDL (under 40)    Encouraged patient to work on weight loss and smoking cessation, both of which would help her HDL result      Current tobacco use    She is not ready to quit smoking, but I encouraged her to do so; explained smoking may be interfering with healing of her foot ulcers      Chronic pain disorder - Primary    She is getting pain medicine from foot doctor at this time; she has been unable to get back to the pain clinic; I will not be writing her pain medicine in the future should the need arise; I am willing to prescribe Lyrica for her; Rx written after review of the [place] website      Relevant Medications   LYRICA 200 MG capsule      Follow up plan: Return in about 4 weeks (  around 09/26/2015) for fasting labs and visit.  An after-visit summary was printed and given to the patient at [place].  Please see the patient instructions which may contain other information and recommendations beyond what is mentioned above in the assessment and plan.  Meds ordered this encounter  Medications  . LYRICA 200 MG capsule    Sig: Take 1 capsule (200 mg total) by mouth 2 (two) times daily.    Dispense:  60 capsule    Refill:  2  . doxycycline (DORYX) 100 MG EC tablet    Sig: Take 100 mg by mouth 2 (two) times daily.  Marland Kitchen atorvastatin (LIPITOR) 40 MG tablet    Sig: Take 40 mg by mouth daily.  . ranitidine (ZANTAC) 150 MG tablet    Sig: Take 1 tablet (150 mg total) by mouth 2 (two) times daily.    Dispense:  60 tablet    Refill:  2   Medications Discontinued During This Encounter  Medication Reason    . predniSONE (DELTASONE) 10 MG tablet Completed Course  . HYDROcodone-acetaminophen (NORCO) 5-325 MG tablet Discontinued by provider  . ketorolac (TORADOL) 10 MG tablet Completed Course  . LYRICA 200 MG capsule Reorder

## 2015-08-30 ENCOUNTER — Encounter: Payer: Self-pay | Admitting: Family Medicine

## 2015-08-30 DIAGNOSIS — K219 Gastro-esophageal reflux disease without esophagitis: Secondary | ICD-10-CM

## 2015-08-30 DIAGNOSIS — M797 Fibromyalgia: Secondary | ICD-10-CM | POA: Insufficient documentation

## 2015-08-30 DIAGNOSIS — E786 Lipoprotein deficiency: Secondary | ICD-10-CM

## 2015-08-30 HISTORY — DX: Lipoprotein deficiency: E78.6

## 2015-08-30 HISTORY — DX: Gastro-esophageal reflux disease without esophagitis: K21.9

## 2015-08-30 NOTE — Assessment & Plan Note (Signed)
Encouraged patient to work on weight loss and smoking cessation, both of which would help her HDL result

## 2015-08-30 NOTE — Assessment & Plan Note (Signed)
Risks associated with long-term use of PPIs; in absence of red flags, recommend she use BID H2 blocker

## 2015-08-30 NOTE — Assessment & Plan Note (Signed)
Two ulcers on the 4th and 5th toes right foot; under the care of podiatrist; dressed with sterile gauze; home health nurse coming out in 2 hours to redress and monitor; continue antibiotics per podiatrist; no pain medicine written from this office, podiatrist writing pain medicine

## 2015-08-30 NOTE — Assessment & Plan Note (Signed)
She is getting pain medicine from foot doctor at this time; she has been unable to get back to the pain clinic; I will not be writing her pain medicine in the future should the need arise; I am willing to prescribe Lyrica for her; Rx written after review of the [employer]

## 2015-08-30 NOTE — Assessment & Plan Note (Signed)
We talked about her weight gain; she has limited mobility with her foot ulcers, so I urged her to really watch her dietary intake

## 2015-08-30 NOTE — Assessment & Plan Note (Signed)
Reviewed [place] website; no other prescribers for Lyrica; no early fills; Rx supplied; she is on an SSRI per her psychiatrist, though the bottle says to take every other day; I encouraged her to contact her psychiatrist right away to find out if that is correct, as I've not seen Lexapro dosed every other day; patient agrees to call her

## 2015-08-30 NOTE — Assessment & Plan Note (Signed)
She is not ready to quit smoking, but I encouraged her to do so; explained smoking may be interfering with healing of her foot ulcers

## 2015-08-31 DIAGNOSIS — G629 Polyneuropathy, unspecified: Secondary | ICD-10-CM | POA: Diagnosis not present

## 2015-08-31 DIAGNOSIS — L97421 Non-pressure chronic ulcer of left heel and midfoot limited to breakdown of skin: Secondary | ICD-10-CM | POA: Diagnosis not present

## 2015-08-31 DIAGNOSIS — G8929 Other chronic pain: Secondary | ICD-10-CM | POA: Diagnosis not present

## 2015-08-31 DIAGNOSIS — J449 Chronic obstructive pulmonary disease, unspecified: Secondary | ICD-10-CM | POA: Diagnosis not present

## 2015-08-31 DIAGNOSIS — M1991 Primary osteoarthritis, unspecified site: Secondary | ICD-10-CM | POA: Diagnosis not present

## 2015-08-31 DIAGNOSIS — F319 Bipolar disorder, unspecified: Secondary | ICD-10-CM | POA: Diagnosis not present

## 2015-09-02 DIAGNOSIS — J449 Chronic obstructive pulmonary disease, unspecified: Secondary | ICD-10-CM | POA: Diagnosis not present

## 2015-09-02 DIAGNOSIS — G629 Polyneuropathy, unspecified: Secondary | ICD-10-CM | POA: Diagnosis not present

## 2015-09-02 DIAGNOSIS — L89892 Pressure ulcer of other site, stage 2: Secondary | ICD-10-CM | POA: Diagnosis not present

## 2015-09-02 DIAGNOSIS — M1991 Primary osteoarthritis, unspecified site: Secondary | ICD-10-CM | POA: Diagnosis not present

## 2015-09-02 DIAGNOSIS — G8929 Other chronic pain: Secondary | ICD-10-CM | POA: Diagnosis not present

## 2015-09-02 DIAGNOSIS — F319 Bipolar disorder, unspecified: Secondary | ICD-10-CM | POA: Diagnosis not present

## 2015-09-05 DIAGNOSIS — J449 Chronic obstructive pulmonary disease, unspecified: Secondary | ICD-10-CM | POA: Diagnosis not present

## 2015-09-05 DIAGNOSIS — M1991 Primary osteoarthritis, unspecified site: Secondary | ICD-10-CM | POA: Diagnosis not present

## 2015-09-05 DIAGNOSIS — F319 Bipolar disorder, unspecified: Secondary | ICD-10-CM | POA: Diagnosis not present

## 2015-09-05 DIAGNOSIS — G629 Polyneuropathy, unspecified: Secondary | ICD-10-CM | POA: Diagnosis not present

## 2015-09-05 DIAGNOSIS — L89892 Pressure ulcer of other site, stage 2: Secondary | ICD-10-CM | POA: Diagnosis not present

## 2015-09-05 DIAGNOSIS — G8929 Other chronic pain: Secondary | ICD-10-CM | POA: Diagnosis not present

## 2015-09-07 DIAGNOSIS — L97521 Non-pressure chronic ulcer of other part of left foot limited to breakdown of skin: Secondary | ICD-10-CM | POA: Diagnosis not present

## 2015-09-07 DIAGNOSIS — L97512 Non-pressure chronic ulcer of other part of right foot with fat layer exposed: Secondary | ICD-10-CM | POA: Diagnosis not present

## 2015-09-09 ENCOUNTER — Other Ambulatory Visit: Payer: Self-pay | Admitting: Family Medicine

## 2015-09-09 DIAGNOSIS — J449 Chronic obstructive pulmonary disease, unspecified: Secondary | ICD-10-CM | POA: Diagnosis not present

## 2015-09-09 DIAGNOSIS — G8929 Other chronic pain: Secondary | ICD-10-CM | POA: Diagnosis not present

## 2015-09-09 DIAGNOSIS — G629 Polyneuropathy, unspecified: Secondary | ICD-10-CM | POA: Diagnosis not present

## 2015-09-09 DIAGNOSIS — L89892 Pressure ulcer of other site, stage 2: Secondary | ICD-10-CM | POA: Diagnosis not present

## 2015-09-09 DIAGNOSIS — M1991 Primary osteoarthritis, unspecified site: Secondary | ICD-10-CM | POA: Diagnosis not present

## 2015-09-09 DIAGNOSIS — F319 Bipolar disorder, unspecified: Secondary | ICD-10-CM | POA: Diagnosis not present

## 2015-09-12 DIAGNOSIS — F319 Bipolar disorder, unspecified: Secondary | ICD-10-CM | POA: Diagnosis not present

## 2015-09-12 DIAGNOSIS — L89892 Pressure ulcer of other site, stage 2: Secondary | ICD-10-CM | POA: Diagnosis not present

## 2015-09-12 DIAGNOSIS — G629 Polyneuropathy, unspecified: Secondary | ICD-10-CM | POA: Diagnosis not present

## 2015-09-12 DIAGNOSIS — G8929 Other chronic pain: Secondary | ICD-10-CM | POA: Diagnosis not present

## 2015-09-12 DIAGNOSIS — M1991 Primary osteoarthritis, unspecified site: Secondary | ICD-10-CM | POA: Diagnosis not present

## 2015-09-12 DIAGNOSIS — J449 Chronic obstructive pulmonary disease, unspecified: Secondary | ICD-10-CM | POA: Diagnosis not present

## 2015-09-13 ENCOUNTER — Other Ambulatory Visit: Payer: Self-pay | Admitting: Psychiatry

## 2015-09-13 ENCOUNTER — Ambulatory Visit: Payer: Commercial Managed Care - HMO | Admitting: Psychiatry

## 2015-09-14 ENCOUNTER — Other Ambulatory Visit: Payer: Self-pay

## 2015-09-14 DIAGNOSIS — L89892 Pressure ulcer of other site, stage 2: Secondary | ICD-10-CM | POA: Diagnosis not present

## 2015-09-14 DIAGNOSIS — G629 Polyneuropathy, unspecified: Secondary | ICD-10-CM | POA: Diagnosis not present

## 2015-09-14 DIAGNOSIS — M1991 Primary osteoarthritis, unspecified site: Secondary | ICD-10-CM | POA: Diagnosis not present

## 2015-09-14 DIAGNOSIS — G8929 Other chronic pain: Secondary | ICD-10-CM | POA: Diagnosis not present

## 2015-09-14 DIAGNOSIS — F319 Bipolar disorder, unspecified: Secondary | ICD-10-CM | POA: Diagnosis not present

## 2015-09-14 DIAGNOSIS — J449 Chronic obstructive pulmonary disease, unspecified: Secondary | ICD-10-CM | POA: Diagnosis not present

## 2015-09-14 MED ORDER — ALPRAZOLAM 0.5 MG PO TABS: 0.5000 mg | ORAL_TABLET | Freq: Three times a day (TID) | ORAL | Status: DC

## 2015-09-14 NOTE — Telephone Encounter (Signed)
rx faxed and confirmed. xanax .5mg  id # [id number] order  # [id number]

## 2015-09-14 NOTE — Telephone Encounter (Signed)
pt called states that she needs a refill on xanax pt has appt on  09-20-15

## 2015-09-16 DIAGNOSIS — M1991 Primary osteoarthritis, unspecified site: Secondary | ICD-10-CM | POA: Diagnosis not present

## 2015-09-16 DIAGNOSIS — G629 Polyneuropathy, unspecified: Secondary | ICD-10-CM | POA: Diagnosis not present

## 2015-09-16 DIAGNOSIS — F319 Bipolar disorder, unspecified: Secondary | ICD-10-CM | POA: Diagnosis not present

## 2015-09-16 DIAGNOSIS — J449 Chronic obstructive pulmonary disease, unspecified: Secondary | ICD-10-CM | POA: Diagnosis not present

## 2015-09-16 DIAGNOSIS — G8929 Other chronic pain: Secondary | ICD-10-CM | POA: Diagnosis not present

## 2015-09-16 DIAGNOSIS — L89892 Pressure ulcer of other site, stage 2: Secondary | ICD-10-CM | POA: Diagnosis not present

## 2015-09-19 DIAGNOSIS — G8929 Other chronic pain: Secondary | ICD-10-CM | POA: Diagnosis not present

## 2015-09-19 DIAGNOSIS — J449 Chronic obstructive pulmonary disease, unspecified: Secondary | ICD-10-CM | POA: Diagnosis not present

## 2015-09-19 DIAGNOSIS — M1991 Primary osteoarthritis, unspecified site: Secondary | ICD-10-CM | POA: Diagnosis not present

## 2015-09-19 DIAGNOSIS — G629 Polyneuropathy, unspecified: Secondary | ICD-10-CM | POA: Diagnosis not present

## 2015-09-19 DIAGNOSIS — F319 Bipolar disorder, unspecified: Secondary | ICD-10-CM | POA: Diagnosis not present

## 2015-09-19 DIAGNOSIS — L89892 Pressure ulcer of other site, stage 2: Secondary | ICD-10-CM | POA: Diagnosis not present

## 2015-09-19 DIAGNOSIS — L89629 Pressure ulcer of left heel, unspecified stage: Secondary | ICD-10-CM | POA: Diagnosis not present

## 2015-09-20 ENCOUNTER — Ambulatory Visit (INDEPENDENT_AMBULATORY_CARE_PROVIDER_SITE_OTHER): Payer: Commercial Managed Care - HMO | Admitting: Psychiatry

## 2015-09-20 ENCOUNTER — Encounter: Payer: Self-pay | Admitting: Psychiatry

## 2015-09-20 DIAGNOSIS — F4310 Post-traumatic stress disorder, unspecified: Secondary | ICD-10-CM

## 2015-09-20 DIAGNOSIS — F3130 Bipolar disorder, current episode depressed, mild or moderate severity, unspecified: Secondary | ICD-10-CM | POA: Diagnosis not present

## 2015-09-20 MED ORDER — ALPRAZOLAM 0.5 MG PO TABS: 0.5000 mg | ORAL_TABLET | Freq: Three times a day (TID) | ORAL | 0 refills | Status: DC | PRN

## 2015-09-20 MED ORDER — ESCITALOPRAM OXALATE 20 MG PO TABS: 20.0000 mg | ORAL_TABLET | Freq: Every day | ORAL | 2 refills | Status: DC

## 2015-09-20 MED ORDER — QUETIAPINE FUMARATE ER 300 MG PO TB24: 300.0000 mg | ORAL_TABLET | Freq: Every day | ORAL | 1 refills | Status: DC

## 2015-09-20 MED ORDER — DIVALPROEX SODIUM 250 MG PO DR TAB: 250.0000 mg | DELAYED_RELEASE_TABLET | Freq: Two times a day (BID) | ORAL | 1 refills | Status: DC

## 2015-09-20 NOTE — Progress Notes (Signed)
Patient ID: Nicole Austin, female   DOB: 10-30-1960, 55 y.o.   MRN: [id number] [facility] MD/PA/NP OP Progress Note  09/20/2015 4:24 PM Nicole Austin  MRN:  [id number]  Subjective:  Patient is a [name] with history of bipolar disorder and posttraumatic stress disorder who was seen for follow-up. She was tearful during the interview and reported that she still living with his sisters and does not have her own space. She reported that they have evicted her from her house as she did not have the fire extinguisher in the right place.. She reported that she is trying to go back to her own apartment. She has been compliant with her medications. She remained focus on going on the higher dose of the Xanax. She currently denied having any suicidal ideations or plans. She is able to walk on her feet and continues to have limping. She was tearful throughout the interview and reported that she has increased anxiety as she does not have a good relationship with her sister who are supporting her at this time. She denied having any suicidal homicidal ideations or plans. We discussed about her medications in detail and she demonstrated understanding.     Chief Complaint: pain  Visit Diagnosis:     ICD-9-CM ICD-10-CM   1. PTSD (post-traumatic stress disorder) 309.81 F43.10   2. Bipolar I disorder, most recent episode depressed ([place]) 296.50 F31.30     Past Medical History:  Past Medical History:  Diagnosis Date  . ADHD (attention deficit hyperactivity disorder)   . Arthritis   . Asthma   . Bipolar 1 disorder ([place])   . COPD (chronic obstructive pulmonary disease) ([place])   . Depression   . GERD (gastroesophageal reflux disease) 08/30/2015  . Low HDL (under 40) 08/30/2015  . MI (myocardial infarction) ([place])   . PTSD (post-traumatic stress disorder)   . Rhabdomyolysis   . Stroke [facility])     Past Surgical History:  Procedure Laterality Date  . REPLACEMENT TOTAL KNEE Left    Family  History:  Family History  Problem Relation Age of Onset  . Hernia Mother   . Heart disease Mother   . OCD Mother   . Diabetes Mother   . Parkinson's disease Father   . Bipolar disorder Sister   . Schizophrenia Sister   . ADD / ADHD Sister   . Alcohol abuse Brother   . Bipolar disorder Sister   . Paranoid behavior Sister   . ADD / ADHD Sister    Social History:  Social History   Social History  . Marital status: Divorced    Spouse name: N/A  . Number of children: N/A  . Years of education: N/A   Social History Main Topics  . Smoking status: Current Every Day Smoker    Packs/day: 0.50    Types: Cigarettes    Start date: 10/11/1984  . Smokeless tobacco: Never Used  . Alcohol use No  . Drug use: No  . Sexual activity: No   Other Topics Concern  . Not on file   Social History Narrative  . No narrative on file   Additional History:   Assessment:   Musculoskeletal: Strength & Muscle Tone: within normal limits Gait & Station: limping Patient leans: N/A  Psychiatric Specialty Exam: Anxiety  Symptoms include insomnia and nervous/anxious behavior. Patient reports no suicidal ideas.    Depression         Associated symptoms include insomnia.  Associated symptoms include no suicidal ideas.  Past medical history includes anxiety.   Insomnia  PMH includes: depression.    Review of Systems  Psychiatric/Behavioral: Positive for depression. Negative for hallucinations, memory loss, substance abuse and suicidal ideas. The patient is nervous/anxious and has insomnia.     There were no vitals taken for this visit.There is no height or weight on file to calculate BMI.  General Appearance: Disheveled   Eye Contact:  Good  Speech:  Clear and Coherent and Normal Rate  Volume:  Normal  Mood:  Anxious and Depressed  Affect:  Constricted and tearful   Thought Process:  Linear  Orientation:  Full (Time, Place, and Person)  Thought Content:  Negative  Suicidal Thoughts:  No   Homicidal Thoughts:  No  Memory:  Immediate;   Good Recent;   Good Remote;   Good  Judgement:  Good  Insight:  Good  Psychomotor Activity:  Negative  Concentration:  Good  Recall:  Good  Fund of Knowledge: Good  Language: Good  Akathisia:  Negative  Handed:  Right unknown  AIMS (if indicated):  Not done  Assets:  Communication Skills Housing Social Support  ADL's:  Intact  Cognition: WNL  Sleep:  Good   Is the patient at risk to self?  No. Has the patient been a risk to self in the past 6 months?  No. Has the patient been a risk to self within the distant past?  Yes.   Is the patient a risk to others?  No. Has the patient been a risk to others in the past 6 months?  No. Has the patient been a risk to others within the distant past?  No.  Current Medications: Current Outpatient Prescriptions  Medication Sig Dispense Refill  . albuterol (PROVENTIL HFA;VENTOLIN HFA) 108 (90 Base) MCG/ACT inhaler Inhale 2 puffs into the lungs every 6 (six) hours as needed for wheezing or shortness of breath. 1 Inhaler 0  . albuterol (PROVENTIL) (2.5 MG/3ML) 0.083% nebulizer solution TAKE 3 ML (2.5MG  TOTAL) BY NEBULIZATION EVERY 4 (FOUR) HOURS AS NEEDED FOR WHEEZING AND SHORTNESS OF BREATH 75 mL 0  . ALPRAZolam (XANAX) 0.5 MG tablet Take 1 tablet (0.5 mg total) by mouth 3 (three) times daily. 21 tablet 0  . atorvastatin (LIPITOR) 40 MG tablet Take 40 mg by mouth daily.    . collagenase (SANTYL) ointment Apply topically daily. 15 g 0  . cyclobenzaprine (FLEXERIL) 5 MG tablet Take 1 tablet (5 mg total) by mouth 3 (three) times daily as needed for muscle spasms. 90 tablet 2  . divalproex (DEPAKOTE) 250 MG DR tablet Take 1 tablet (250 mg total) by mouth 2 (two) times daily. 60 tablet 1  . doxycycline (DORYX) 100 MG EC tablet Take 100 mg by mouth 2 (two) times daily.    Marland Kitchen escitalopram (LEXAPRO) 20 MG tablet Take 1 tablet (20 mg total) by mouth every other day. (Patient taking differently: Take 20 mg by  mouth daily. ) 30 tablet 2  . furosemide (LASIX) 20 MG tablet Take 1 tablet (20 mg total) by mouth daily. 3 tablet 0  . lidocaine (XYLOCAINE) 5 % ointment Apply 1 application topically as needed for mild pain.    Marland Kitchen LYRICA 200 MG capsule Take 1 capsule (200 mg total) by mouth 2 (two) times daily. 60 capsule 2  . Multiple Vitamin (MULTIVITAMIN WITH MINERALS) TABS tablet Take 1 tablet by mouth daily.    . naproxen (NAPROSYN) 375 MG tablet Take 1 tablet (375 mg total) by mouth 2 (  two) times daily with a meal. Take 81 mg coated aspirin daily one hour PRIOR to morning naproxen 60 tablet 5  . QUEtiapine (SEROQUEL XR) 300 MG 24 hr tablet Take 1 tablet (300 mg total) by mouth at bedtime. 30 tablet 1  . ranitidine (ZANTAC) 150 MG tablet Take 1 tablet (150 mg total) by mouth 2 (two) times daily. 60 tablet 2  . traMADol (ULTRAM) 50 MG tablet Take 1 tablet (50 mg total) by mouth every 12 (twelve) hours as needed. 60 tablet 0   No current facility-administered medications for this visit.     Medical Decision Making:  Established Problem, Stable/Improving (1) and Review or order clinical lab tests (1)  Treatment Plan Summary:Medication management    Bipolar disorder-continue Seroquel XR at 300 mg at bedtime.   Discussed with patient about her Xanax dose continue  0.5 mg by mouth 3 times a day and patient agreed with the plan.  Continue her Lexapro 20 mg a day. She is on Depakote 250 mg twice a day medication refills.   PTSD-stable. Will continue the Lexapro and Seroquel.  Patient will follow up in 1 months or earlier depending on her symptoms    More than 50% of the time spent in psychoeducation, counseling and coordination of care.    This note was generated in part or whole with voice recognition software. Voice regonition is usually quite accurate but there are transcription errors that can and very often do occur. I apologize for any typographical errors that were not detected and corrected.     Rainey Pines, MD  09/20/2015, 4:24 PM

## 2015-09-21 DIAGNOSIS — L97512 Non-pressure chronic ulcer of other part of right foot with fat layer exposed: Secondary | ICD-10-CM | POA: Diagnosis not present

## 2015-09-23 DIAGNOSIS — J449 Chronic obstructive pulmonary disease, unspecified: Secondary | ICD-10-CM | POA: Diagnosis not present

## 2015-09-23 DIAGNOSIS — F319 Bipolar disorder, unspecified: Secondary | ICD-10-CM | POA: Diagnosis not present

## 2015-09-23 DIAGNOSIS — G8929 Other chronic pain: Secondary | ICD-10-CM | POA: Diagnosis not present

## 2015-09-23 DIAGNOSIS — M1991 Primary osteoarthritis, unspecified site: Secondary | ICD-10-CM | POA: Diagnosis not present

## 2015-09-23 DIAGNOSIS — L89892 Pressure ulcer of other site, stage 2: Secondary | ICD-10-CM | POA: Diagnosis not present

## 2015-09-23 DIAGNOSIS — G629 Polyneuropathy, unspecified: Secondary | ICD-10-CM | POA: Diagnosis not present

## 2015-09-26 DIAGNOSIS — G629 Polyneuropathy, unspecified: Secondary | ICD-10-CM | POA: Diagnosis not present

## 2015-09-26 DIAGNOSIS — M1991 Primary osteoarthritis, unspecified site: Secondary | ICD-10-CM | POA: Diagnosis not present

## 2015-09-26 DIAGNOSIS — J449 Chronic obstructive pulmonary disease, unspecified: Secondary | ICD-10-CM | POA: Diagnosis not present

## 2015-09-26 DIAGNOSIS — F319 Bipolar disorder, unspecified: Secondary | ICD-10-CM | POA: Diagnosis not present

## 2015-09-26 DIAGNOSIS — G8929 Other chronic pain: Secondary | ICD-10-CM | POA: Diagnosis not present

## 2015-09-26 DIAGNOSIS — L89892 Pressure ulcer of other site, stage 2: Secondary | ICD-10-CM | POA: Diagnosis not present

## 2015-09-28 DIAGNOSIS — F319 Bipolar disorder, unspecified: Secondary | ICD-10-CM | POA: Diagnosis not present

## 2015-09-28 DIAGNOSIS — G8929 Other chronic pain: Secondary | ICD-10-CM | POA: Diagnosis not present

## 2015-09-28 DIAGNOSIS — G629 Polyneuropathy, unspecified: Secondary | ICD-10-CM | POA: Diagnosis not present

## 2015-09-28 DIAGNOSIS — M1991 Primary osteoarthritis, unspecified site: Secondary | ICD-10-CM | POA: Diagnosis not present

## 2015-09-28 DIAGNOSIS — L89892 Pressure ulcer of other site, stage 2: Secondary | ICD-10-CM | POA: Diagnosis not present

## 2015-09-28 DIAGNOSIS — J449 Chronic obstructive pulmonary disease, unspecified: Secondary | ICD-10-CM | POA: Diagnosis not present

## 2015-09-29 ENCOUNTER — Ambulatory Visit: Payer: Self-pay | Admitting: Family Medicine

## 2015-09-30 DIAGNOSIS — J449 Chronic obstructive pulmonary disease, unspecified: Secondary | ICD-10-CM | POA: Diagnosis not present

## 2015-09-30 DIAGNOSIS — M1991 Primary osteoarthritis, unspecified site: Secondary | ICD-10-CM | POA: Diagnosis not present

## 2015-09-30 DIAGNOSIS — F319 Bipolar disorder, unspecified: Secondary | ICD-10-CM | POA: Diagnosis not present

## 2015-09-30 DIAGNOSIS — L89892 Pressure ulcer of other site, stage 2: Secondary | ICD-10-CM | POA: Diagnosis not present

## 2015-09-30 DIAGNOSIS — G629 Polyneuropathy, unspecified: Secondary | ICD-10-CM | POA: Diagnosis not present

## 2015-09-30 DIAGNOSIS — G8929 Other chronic pain: Secondary | ICD-10-CM | POA: Diagnosis not present

## 2015-10-04 ENCOUNTER — Other Ambulatory Visit: Payer: Self-pay | Admitting: Family Medicine

## 2015-10-04 ENCOUNTER — Ambulatory Visit (INDEPENDENT_AMBULATORY_CARE_PROVIDER_SITE_OTHER): Payer: Medicare HMO | Admitting: Family Medicine

## 2015-10-04 ENCOUNTER — Encounter: Payer: Self-pay | Admitting: Family Medicine

## 2015-10-04 DIAGNOSIS — R5383 Other fatigue: Secondary | ICD-10-CM | POA: Insufficient documentation

## 2015-10-04 DIAGNOSIS — E786 Lipoprotein deficiency: Secondary | ICD-10-CM | POA: Diagnosis not present

## 2015-10-04 DIAGNOSIS — E785 Hyperlipidemia, unspecified: Secondary | ICD-10-CM

## 2015-10-04 DIAGNOSIS — Z5181 Encounter for therapeutic drug level monitoring: Secondary | ICD-10-CM | POA: Insufficient documentation

## 2015-10-04 DIAGNOSIS — I638 Other cerebral infarction: Secondary | ICD-10-CM | POA: Diagnosis not present

## 2015-10-04 DIAGNOSIS — E8809 Other disorders of plasma-protein metabolism, not elsewhere classified: Secondary | ICD-10-CM | POA: Diagnosis not present

## 2015-10-04 DIAGNOSIS — I6389 Other cerebral infarction: Secondary | ICD-10-CM

## 2015-10-04 DIAGNOSIS — M810 Age-related osteoporosis without current pathological fracture: Secondary | ICD-10-CM | POA: Diagnosis not present

## 2015-10-04 DIAGNOSIS — R635 Abnormal weight gain: Secondary | ICD-10-CM | POA: Diagnosis not present

## 2015-10-04 DIAGNOSIS — M1A0710 Idiopathic chronic gout, right ankle and foot, without tophus (tophi): Secondary | ICD-10-CM

## 2015-10-04 DIAGNOSIS — E538 Deficiency of other specified B group vitamins: Secondary | ICD-10-CM | POA: Diagnosis not present

## 2015-10-04 DIAGNOSIS — M797 Fibromyalgia: Secondary | ICD-10-CM | POA: Diagnosis not present

## 2015-10-04 DIAGNOSIS — R2689 Other abnormalities of gait and mobility: Secondary | ICD-10-CM | POA: Diagnosis not present

## 2015-10-04 LAB — CBC WITH DIFFERENTIAL/PLATELET
BASOS ABS: 0 {cells}/uL (ref 0–200)
Basophils Relative: 0 %
EOS PCT: 8 %
Eosinophils Absolute: 568 cells/uL — ABNORMAL HIGH (ref 15–500)
HCT: 38.5 % (ref 35.0–45.0)
Hemoglobin: 13.1 g/dL (ref 11.7–15.5)
Lymphocytes Relative: 34 %
Lymphs Abs: 2414 cells/uL (ref 850–3900)
MCH: 30.9 pg (ref 27.0–33.0)
MCHC: 34 g/dL (ref 32.0–36.0)
MCV: 90.8 fL (ref 80.0–100.0)
MONOS PCT: 10 %
MPV: 11 fL (ref 7.5–12.5)
Monocytes Absolute: 710 cells/uL (ref 200–950)
NEUTROS ABS: 3408 {cells}/uL (ref 1500–7800)
Neutrophils Relative %: 48 %
PLATELETS: 185 10*3/uL (ref 140–400)
RBC: 4.24 MIL/uL (ref 3.80–5.10)
RDW: 15.3 % — AB (ref 11.0–15.0)
WBC: 7.1 10*3/uL (ref 3.8–10.8)

## 2015-10-04 LAB — FERRITIN: FERRITIN: 25 ng/mL (ref 10–232)

## 2015-10-04 LAB — VITAMIN B12: Vitamin B-12: 278 pg/mL (ref 200–1100)

## 2015-10-04 NOTE — Assessment & Plan Note (Signed)
Followed by Dr. Melrose Nakayama; check lipids today

## 2015-10-04 NOTE — Assessment & Plan Note (Signed)
Check lipid panel  

## 2015-10-04 NOTE — Assessment & Plan Note (Signed)
Check lipid

## 2015-10-04 NOTE — Assessment & Plan Note (Signed)
Check uric acid. 

## 2015-10-04 NOTE — Patient Instructions (Signed)
Try to limit saturated fats in your diet (bologna, hot dogs, barbeque, cheeseburgers, hamburgers, steak, bacon, sausage, cheese, etc.) and get more fresh fruits, vegetables, and whole grains  Your goal blood pressure is less than 140 mmHg on top. Try to follow the DASH guidelines (DASH stands for Dietary Approaches to Stop Hypertension) Try to limit the sodium in your diet.  Ideally, consume less than 1.5 grams (less than 1,500mg ) per day. Do not add salt when cooking or at the table.  Check the sodium amount on labels when shopping, and choose items lower in sodium when given a choice. Avoid or limit foods that already contain a lot of sodium. Eat a diet rich in fruits and vegetables and whole grains.  Check out the information at familydoctor.org entitled "Nutrition for Weight Loss: What You Need to Know about Fad Diets" Try to lose between 1-2 pounds per week by taking in fewer calories and burning off more calories You can succeed by limiting portions, limiting foods dense in calories and fat, becoming more active, and drinking 8 glasses of water a day (64 ounces) Don't skip meals, especially breakfast, as skipping meals may alter your metabolism Do not use over-the-counter weight loss pills or gimmicks that claim rapid weight loss A healthy BMI (or body mass index) is between 18.5 and 24.9 You can calculate your ideal BMI at the [place] website [employer]fr

## 2015-10-04 NOTE — Assessment & Plan Note (Signed)
Check cmp 

## 2015-10-04 NOTE — Assessment & Plan Note (Signed)
Check TSH and free T4 

## 2015-10-04 NOTE — Assessment & Plan Note (Signed)
Check TSH, refer to bariatric surgeon

## 2015-10-04 NOTE — Progress Notes (Signed)
Pulse 96   Temp 98.1 F (36.7 C) (Oral)   Resp 18   Wt 267 lb (121.1 kg)   LMP  (LMP Unknown)   SpO2 91%   BMI 50.45 kg/m    Subjective:    Patient ID: Nicole Austin, female    DOB: 1960-08-20, 55 y.o.   MRN: [id number]  HPI: Nicole Austin is a 55 y.o. female  Chief Complaint  Patient presents with  . Follow-up   Patient is here for follow-up She has gained 22 pounds over the last 2.5 months Having some trouble breathing, fluid in the legs Has a boot on the right foot; gangrene, seeing had a broke pinky toe and nurse wrapped it, Dr. Elvina Mattes was seeing every 2 weeks, but now every 3 weeks Appetite is through the roof; nervous hungry, stress eating like crazy She is interested in the bands for weight loss; helps loose weight and curb appetite is what she has read; she wants to get full faster; eats nutritional food; drinks not much water during the day; uses packets of sweet tea (artificial sweetener), but not doing that any more Weight loss is paramount for her Sister had thyrotoxic storm and is not hypothyroid Little bit of constipation; dry skin; having fatigue  Reviewed last labs; BNP mildly elevated in May; Cr was mildly elevated in May; normal CBC in May Protein 6.1, albumin 3.3 in May Had some tea with sugar one hour before  She wants referral to pain clinic; has "messed up" her L4, L5; has seen back doctor, Dr. Rogers Blocker at [facility] Also sees Dr. Melrose Nakayama, stroke  Depression screen [facility] 2/9 08/29/2015 06/21/2015 01/31/2015 01/12/2015 12/10/2014  Decreased Interest 0 1 2 0 3  Down, Depressed, Hopeless 0 1 3 0 3  PHQ - 2 Score 0 2 5 0 6  Altered sleeping - 0 2 - 3  Tired, decreased energy - 2 1 - 3  Change in appetite - 2 0 - 3  Feeling bad or failure about yourself  - 0 1 - 3  Trouble concentrating - 1 1 - 3  Moving slowly or fidgety/restless - 0 1 - 3  Suicidal thoughts - 0 0 - 0  PHQ-9 Score - 7 11 - 24  Difficult doing work/chores - Somewhat difficult - -  Extremely dIfficult   Relevant past medical, surgical, family and social history reviewed Past Medical History:  Diagnosis Date  . ADHD (attention deficit hyperactivity disorder)   . Arthritis   . Asthma   . Bipolar 1 disorder ([place])   . COPD (chronic obstructive pulmonary disease) ([place])   . Depression   . GERD (gastroesophageal reflux disease) 08/30/2015  . Low HDL (under 40) 08/30/2015  . MI (myocardial infarction) ([place])   . PTSD (post-traumatic stress disorder)   . Rhabdomyolysis   . Stroke [facility])    Past Surgical History:  Procedure Laterality Date  . REPLACEMENT TOTAL KNEE Left    Family History  Problem Relation Age of Onset  . Hernia Mother   . Heart disease Mother   . OCD Mother   . Diabetes Mother   . Parkinson's disease Father   . Bipolar disorder Sister   . Schizophrenia Sister   . ADD / ADHD Sister   . Alcohol abuse Brother   . Bipolar disorder Sister   . Paranoid behavior Sister   . ADD / ADHD Sister    Social History  Substance Use Topics  . Smoking status: Current Every Day  Smoker    Packs/day: 0.50    Types: Cigarettes    Start date: 10/11/1984  . Smokeless tobacco: Never Used  . Alcohol use No   Interim medical history since last visit reviewed. Allergies and medications reviewed  Review of Systems Per HPI unless specifically indicated above     Objective:    Pulse 96   Temp 98.1 F (36.7 C) (Oral)   Resp 18   Wt 267 lb (121.1 kg)   LMP  (LMP Unknown)   SpO2 91%   BMI 50.45 kg/m   Wt Readings from Last 3 Encounters:  10/06/15 267 lb (121.1 kg)  10/04/15 267 lb (121.1 kg)  08/29/15 260 lb (117.9 kg)    Physical Exam  Constitutional: She appears well-developed and well-nourished.  Morbidly obese, weight gain 7 pounds in one month  HENT:  Mouth/Throat: Mucous membranes are normal.  Eyes: EOM are normal. No scleral icterus.  Cardiovascular: Normal rate and regular rhythm.   No extrasystoles are present.  Pulmonary/Chest: Effort normal.  No accessory muscle usage. She has no decreased breath sounds. She has no wheezes. She has no rhonchi. She has no rales.  Musculoskeletal: She exhibits no edema (no pitting pretibial edema).       Right foot: There is no tenderness and no swelling.  Skin: She is not diaphoretic. No pallor.  Dry skin, almost ichthyotic on shins  Psychiatric: Her behavior is normal. Her mood appears not anxious. Her affect is not blunt and not inappropriate. Her speech is not rapid and/or pressured. She does not exhibit a depressed mood.  Good eye contact with examiner; polite and cooperative      Assessment & Plan:   Problem List Items Addressed This Visit      Cardiovascular and Mediastinum   Stroke [facility])    Followed by Dr. Melrose Nakayama; check lipids today        Musculoskeletal and Integument   OP (osteoporosis)    Check vit D, fall precautions, three servings of calcium a day        Other   Morbid obesity ([place])    Check TSH, refer to bariatric surgeon      Relevant Orders   Ambulatory referral to General Surgery   Low HDL (under 40)    Check lipid panel      Relevant Orders   Lipid panel (Completed)   Hypoalbuminemia    Check urine for protein      Relevant Orders   Microalbumin / creatinine urine ratio (Completed)   HLD (hyperlipidemia)    Check lipid      Gout    Check uric acid      Fatigue    Check labs      Relevant Orders   CBC with Differential/Platelet (Completed)   Ferritin (Completed)   Vitamin B12 (Completed)   VITAMIN D 25 Hydroxy (Vit-D Deficiency, Fractures) (Completed)   Encounter for medication monitoring    Check cmp      Relevant Orders   Comprehensive Metabolic Panel (CMET) (Completed)   Abnormal weight gain    Check TSH and free T4      Relevant Orders   TSH (Completed)   T4, free (Completed)    Other Visit Diagnoses   None.     Follow up plan: Return in about 4 weeks (around 11/01/2015) for follow-up.  An after-visit summary was printed  and given to the patient at [facility].  Please see the patient instructions which may contain other information  and recommendations beyond what is mentioned above in the assessment and plan.  No orders of the defined types were placed in this encounter.   Orders Placed This Encounter  Procedures  . CBC with Differential/Platelet  . Ferritin  . Vitamin B12  . VITAMIN D 25 Hydroxy (Vit-D Deficiency, Fractures)  . TSH  . T4, free  . Lipid panel  . Comprehensive Metabolic Panel (CMET)  . Microalbumin / creatinine urine ratio  . Ambulatory referral to General Surgery

## 2015-10-04 NOTE — Assessment & Plan Note (Signed)
Check vit D, fall precautions, three servings of calcium a day

## 2015-10-04 NOTE — Assessment & Plan Note (Signed)
Check labs 

## 2015-10-04 NOTE — Assessment & Plan Note (Signed)
Check urine for protein

## 2015-10-05 ENCOUNTER — Telehealth: Payer: Self-pay | Admitting: Family Medicine

## 2015-10-05 DIAGNOSIS — G629 Polyneuropathy, unspecified: Secondary | ICD-10-CM | POA: Diagnosis not present

## 2015-10-05 DIAGNOSIS — M1991 Primary osteoarthritis, unspecified site: Secondary | ICD-10-CM | POA: Diagnosis not present

## 2015-10-05 DIAGNOSIS — G8929 Other chronic pain: Secondary | ICD-10-CM | POA: Diagnosis not present

## 2015-10-05 DIAGNOSIS — J449 Chronic obstructive pulmonary disease, unspecified: Secondary | ICD-10-CM | POA: Diagnosis not present

## 2015-10-05 DIAGNOSIS — F319 Bipolar disorder, unspecified: Secondary | ICD-10-CM | POA: Diagnosis not present

## 2015-10-05 DIAGNOSIS — L89892 Pressure ulcer of other site, stage 2: Secondary | ICD-10-CM | POA: Diagnosis not present

## 2015-10-05 LAB — LIPID PANEL
CHOL/HDL RATIO: 3.4 ratio (ref <=5.0)
Cholesterol: 140 mg/dL (ref 125–200)
HDL: 41 mg/dL — AB (ref >=46)
LDL CALC: 49 mg/dL (ref <130)
Triglycerides: 248 mg/dL — ABNORMAL HIGH (ref <150)
VLDL: 50 mg/dL — AB (ref <30)

## 2015-10-05 LAB — COMPREHENSIVE METABOLIC PANEL
ALBUMIN: 3.8 g/dL (ref 3.6–5.1)
ALK PHOS: 80 U/L (ref 33–130)
ALT: 19 U/L (ref 6–29)
AST: 24 U/L (ref 10–35)
BILIRUBIN TOTAL: 0.4 mg/dL (ref 0.2–1.2)
BUN: 12 mg/dL (ref 7–25)
CO2: 30 mmol/L (ref 20–31)
CREATININE: 1.13 mg/dL — AB (ref 0.50–1.05)
Calcium: 9.6 mg/dL (ref 8.6–10.4)
Chloride: 102 mmol/L (ref 98–110)
Glucose, Bld: 86 mg/dL (ref 65–99)
Potassium: 4.6 mmol/L (ref 3.5–5.3)
SODIUM: 142 mmol/L (ref 135–146)
TOTAL PROTEIN: 6.2 g/dL (ref 6.1–8.1)

## 2015-10-05 LAB — T4, FREE: Free T4: 1 ng/dL (ref 0.8–1.8)

## 2015-10-05 LAB — MICROALBUMIN / CREATININE URINE RATIO

## 2015-10-05 LAB — TSH: TSH: 1.9 m[IU]/L

## 2015-10-05 LAB — VITAMIN D 25 HYDROXY (VIT D DEFICIENCY, FRACTURES): Vit D, 25-Hydroxy: 31 ng/mL (ref 30–100)

## 2015-10-06 ENCOUNTER — Emergency Department: Payer: Commercial Managed Care - HMO

## 2015-10-06 ENCOUNTER — Encounter: Payer: Self-pay | Admitting: Medical Oncology

## 2015-10-06 ENCOUNTER — Emergency Department
Admission: EM | Admit: 2015-10-06 | Discharge: 2015-10-06 | Disposition: A | Discharge status: Home / Self Care | Payer: Commercial Managed Care - HMO | Attending: Emergency Medicine | Admitting: Emergency Medicine

## 2015-10-06 ENCOUNTER — Telehealth: Payer: Self-pay | Admitting: Family Medicine

## 2015-10-06 DIAGNOSIS — J069 Acute upper respiratory infection, unspecified: Secondary | ICD-10-CM | POA: Diagnosis not present

## 2015-10-06 DIAGNOSIS — F17210 Nicotine dependence, cigarettes, uncomplicated: Secondary | ICD-10-CM | POA: Diagnosis not present

## 2015-10-06 DIAGNOSIS — Z79899 Other long term (current) drug therapy: Secondary | ICD-10-CM | POA: Insufficient documentation

## 2015-10-06 DIAGNOSIS — Z8673 Personal history of transient ischemic attack (TIA), and cerebral infarction without residual deficits: Secondary | ICD-10-CM | POA: Diagnosis not present

## 2015-10-06 DIAGNOSIS — J449 Chronic obstructive pulmonary disease, unspecified: Secondary | ICD-10-CM | POA: Insufficient documentation

## 2015-10-06 DIAGNOSIS — R05 Cough: Secondary | ICD-10-CM | POA: Diagnosis present

## 2015-10-06 DIAGNOSIS — F909 Attention-deficit hyperactivity disorder, unspecified type: Secondary | ICD-10-CM | POA: Diagnosis not present

## 2015-10-06 DIAGNOSIS — R220 Localized swelling, mass and lump, head: Secondary | ICD-10-CM | POA: Diagnosis not present

## 2015-10-06 DIAGNOSIS — J45909 Unspecified asthma, uncomplicated: Secondary | ICD-10-CM | POA: Diagnosis not present

## 2015-10-06 LAB — BASIC METABOLIC PANEL
ANION GAP: 7 (ref 5–15)
BUN: 14 mg/dL (ref 6–20)
CHLORIDE: 102 mmol/L (ref 101–111)
CO2: 30 mmol/L (ref 22–32)
Calcium: 9.3 mg/dL (ref 8.9–10.3)
Creatinine, Ser: 1.01 mg/dL — ABNORMAL HIGH (ref 0.44–1.00)
GFR calc Af Amer: >60 mL/min (ref >60)
GLUCOSE: 99 mg/dL (ref 65–99)
POTASSIUM: 4.3 mmol/L (ref 3.5–5.1)
Sodium: 139 mmol/L (ref 135–145)

## 2015-10-06 LAB — CBC WITH DIFFERENTIAL/PLATELET
BASOS ABS: 0 10*3/uL (ref 0–0.1)
Basophils Relative: 1 %
Eosinophils Absolute: 0.7 10*3/uL (ref 0–0.7)
Eosinophils Relative: 9 %
HEMATOCRIT: 39.2 % (ref 35.0–47.0)
HEMOGLOBIN: 13.6 g/dL (ref 12.0–16.0)
LYMPHS ABS: 2.1 10*3/uL (ref 1.0–3.6)
LYMPHS PCT: 28 %
MCH: 31.2 pg (ref 26.0–34.0)
MCHC: 34.6 g/dL (ref 32.0–36.0)
MCV: 90.2 fL (ref 80.0–100.0)
Monocytes Absolute: 0.6 10*3/uL (ref 0.2–0.9)
Monocytes Relative: 9 %
NEUTROS ABS: 4.2 10*3/uL (ref 1.4–6.5)
Neutrophils Relative %: 55 %
PLATELETS: 181 10*3/uL (ref 150–440)
RBC: 4.35 MIL/uL (ref 3.80–5.20)
RDW: 15 % — ABNORMAL HIGH (ref 11.5–14.5)
WBC: 7.6 10*3/uL (ref 3.6–11.0)

## 2015-10-06 MED ORDER — AZITHROMYCIN 250 MG PO TABS: 500.0000 mg | ORAL_TABLET | Freq: Once | ORAL | Status: DC

## 2015-10-06 MED ORDER — DOXYCYCLINE HYCLATE 100 MG PO TABS
100.0000 mg | ORAL_TABLET | Freq: Once | ORAL | Status: DC
  Filled 2015-10-06 (×2): qty 1

## 2015-10-06 MED ORDER — DOXYCYCLINE HYCLATE 100 MG PO CAPS: 100.0000 mg | ORAL_CAPSULE | Freq: Two times a day (BID) | ORAL | 0 refills | Status: DC

## 2015-10-06 NOTE — ED Triage Notes (Signed)
Pt reports she woke up this am with watery eyes and swelling to bilt eyes, pt also reports that she feels like her face is swollen. Pt denies difficulty breathing.

## 2015-10-06 NOTE — ED Provider Notes (Addendum)
[facility] Emergency Department Provider Note   ____________________________________________   First MD Initiated Contact with Patient 10/06/15 1543     (approximate)  I have reviewed the triage vital signs and the nursing notes.   HISTORY  Chief Complaint Eye Drainage   HPI Nicole Austin is a 55 y.o. female with a history of bipolar disorder as well as COPD who is presenting to the emergency department today because of eye drainage and facial swelling. She says that she has felt weak and had chills last night. She is also reporting a runny nose and cough. She has no known sick contacts. No documented fever. Denies any pain. Michela Pitcher that she called her primary care doctor this morning who sent her to the emergency department for further evaluation. Denying any ear pain or sore throat. Denies any nausea or vomiting.   Past Medical History:  Diagnosis Date  . ADHD (attention deficit hyperactivity disorder)   . Arthritis   . Asthma   . Bipolar 1 disorder ([place])   . COPD (chronic obstructive pulmonary disease) ([place])   . Depression   . GERD (gastroesophageal reflux disease) 08/30/2015  . Low HDL (under 40) 08/30/2015  . MI (myocardial infarction) ([place])   . PTSD (post-traumatic stress disorder)   . Rhabdomyolysis   . Stroke [facility])     Patient Active Problem List   Diagnosis Date Noted  . Abnormal weight gain 10/04/2015  . Fatigue 10/04/2015  . Encounter for medication monitoring 10/04/2015  . Hypoalbuminemia 10/04/2015  . Fibromyalgia 08/30/2015  . Low HDL (under 40) 08/30/2015  . GERD (gastroesophageal reflux disease) 08/30/2015  . Other specified behavioral and emotional disorders with onset usually occurring in childhood and adolescence 07/13/2015  . Acute myocardial infarction ([place]) 06/30/2015  . OP (osteoporosis) 06/30/2015  . Pain in thoracic spine 06/30/2015  . Personal history of other diseases of the musculoskeletal system and connective  tissue 06/30/2015  . Primary malignant neoplasm ([place]) 06/30/2015  . Apnea, sleep 06/30/2015  . Controlled substance agreement signed 06/21/2015  . Other long term (current) drug therapy 06/21/2015  . HLD (hyperlipidemia) 05/13/2015  . Foot ulcer, left ([place]) 04/27/2015  . Chronic pain disorder 04/27/2015  . Chronic pain associated with significant psychosocial dysfunction 04/27/2015  . Non-pressure ulcer of lower extremity ([place]) 04/27/2015  . Stroke ([place]) 03/25/2015  . Cerebral infarction ([place]) 03/25/2015  . Morbid obesity ([place]) 12/22/2014  . Bipolar affective disorder, current episode depressed ([place]) 12/22/2014  . Gout 08/16/2014  . Muscle spasms of both lower extremities 08/16/2014  . Spasm 08/16/2014  . Back pain, chronic 08/13/2014  . Low back pain with sciatica 08/04/2014  . Current tobacco use 08/04/2014  . Polysubstance abuse 07/04/2014  . Psychoactive substance abuse 07/04/2014  . Anxiety, generalized 11/24/2013  . Imbalance 11/24/2013  . Blurred vision 11/24/2013  . Other abnormalities of gait and mobility 11/24/2013  . Unspecified visual disturbance 11/24/2013  . Moderate COPD (chronic obstructive pulmonary disease) ([place]) 11/18/2013  . HPV (human papilloma virus) infection 07/17/2013  . Arthritis of knee, degenerative 07/15/2013  . Personal history of other specified conditions 07/31/2011    Past Surgical History:  Procedure Laterality Date  . REPLACEMENT TOTAL KNEE Left     Prior to Admission medications   Medication Sig Start Date End Date Taking? Authorizing Provider  albuterol (PROVENTIL HFA;VENTOLIN HFA) 108 (90 Base) MCG/ACT inhaler Inhale 2 puffs into the lungs every 6 (six) hours as needed for wheezing or shortness of breath. 05/31/15  Orbie Pyo, MD  albuterol (PROVENTIL) (2.5 MG/3ML) 0.083% nebulizer solution TAKE 3 ML (2.5MG  TOTAL) BY NEBULIZATION EVERY 4 (FOUR) HOURS AS NEEDED FOR WHEEZING AND SHORTNESS OF BREATH 09/09/15   Arnetha Courser, MD    ALPRAZolam Duanne Moron) 0.5 MG tablet Take 1 tablet (0.5 mg total) by mouth 3 (three) times daily as needed for anxiety. 09/20/15   Rainey Pines, MD  atorvastatin (LIPITOR) 40 MG tablet Take 40 mg by mouth daily.    Historical Provider, MD  cyclobenzaprine (FLEXERIL) 5 MG tablet Take 1 tablet (5 mg total) by mouth 3 (three) times daily as needed for muscle spasms. 04/27/15   Bobetta Lime, MD  divalproex (DEPAKOTE) 250 MG DR tablet Take 1 tablet (250 mg total) by mouth 2 (two) times daily. 09/20/15   Rainey Pines, MD  doxycycline (DORYX) 100 MG EC tablet Take 100 mg by mouth 2 (two) times daily.    Historical Provider, MD  escitalopram (LEXAPRO) 20 MG tablet Take 1 tablet (20 mg total) by mouth daily. 09/20/15   Rainey Pines, MD  furosemide (LASIX) 20 MG tablet Take 1 tablet (20 mg total) by mouth daily. 07/16/15   Paulette Blanch, MD  lidocaine (XYLOCAINE) 5 % ointment Apply 1 application topically as needed for mild pain.    Historical Provider, MD  LYRICA 200 MG capsule Take 1 capsule (200 mg total) by mouth 2 (two) times daily. 08/29/15   Arnetha Courser, MD  Multiple Vitamin (MULTIVITAMIN WITH MINERALS) TABS tablet Take 1 tablet by mouth daily.    Historical Provider, MD  naproxen (NAPROSYN) 375 MG tablet Take 1 tablet (375 mg total) by mouth 2 (two) times daily with a meal. Take 81 mg coated aspirin daily one hour PRIOR to morning naproxen 06/21/15   Arnetha Courser, MD  QUEtiapine (SEROQUEL XR) 300 MG 24 hr tablet Take 1 tablet (300 mg total) by mouth at bedtime. 09/20/15   Rainey Pines, MD  ranitidine (ZANTAC) 150 MG tablet Take 1 tablet (150 mg total) by mouth 2 (two) times daily. 08/29/15   Arnetha Courser, MD  traMADol (ULTRAM) 50 MG tablet Take 1 tablet (50 mg total) by mouth every 12 (twelve) hours as needed. 04/27/15   Bobetta Lime, MD    Allergies Codeine  Family History  Problem Relation Age of Onset  . Hernia Mother   . Heart disease Mother   . OCD Mother   . Diabetes Mother   . Parkinson's  disease Father   . Bipolar disorder Sister   . Schizophrenia Sister   . ADD / ADHD Sister   . Alcohol abuse Brother   . Bipolar disorder Sister   . Paranoid behavior Sister   . ADD / ADHD Sister     Social History Social History  Substance Use Topics  . Smoking status: Current Every Day Smoker    Packs/day: 0.50    Types: Cigarettes    Start date: 10/11/1984  . Smokeless tobacco: Never Used  . Alcohol use No    Review of Systems Constitutional: No fever/chills Eyes: As above ENT: No sore throat. Cardiovascular: Denies chest pain. Respiratory:Cough Gastrointestinal: No abdominal pain.  No nausea, no vomiting.  No diarrhea.  No constipation. Genitourinary: Negative for dysuria. Musculoskeletal: Negative for back pain. Skin: Negative for rash. Neurological: Negative for headaches, focal weakness or numbness.  10-point ROS otherwise negative.  ____________________________________________   PHYSICAL EXAM:  VITAL SIGNS: ED Triage Vitals  Enc Vitals Group     BP  10/06/15 1535 (!) 150/84     Pulse Rate 10/06/15 1535 83     Resp 10/06/15 1535 20     Temp 10/06/15 1535 98.2 F (36.8 C)     Temp Source 10/06/15 1535 Oral     SpO2 10/06/15 1535 94 %     Weight 10/06/15 1535 267 lb (121.1 kg)     Height 10/06/15 1535 5\' 1"  (1.549 m)     Head Circumference --      Peak Flow --      Pain Score 10/06/15 1536 10     Pain Loc --      Pain Edu? --      Excl. in [place]? --     Constitutional: Alert and oriented. Well appearing and in no acute distress. Eyes: Conjunctivae are normal. PERRL. EOMI.No swelling around the eyes.No drainage visualized. Head: Atraumatic. TMs are normal bilaterally. Nose: No congestion/rhinnorhea. Mouth/Throat: Mucous membranes are moist.  Oropharynx non-erythematous. Neck: No stridor.   Cardiovascular: Normal rate, regular rhythm. Grossly normal heart sounds.   Respiratory: Normal respiratory effort.  No retractions. Lungs CTAB. Coughs several time  in the room but without any sputum production. Gastrointestinal: Soft and nontender. No distention.  No CVA tenderness. Musculoskeletal: No lower extremity tenderness nor edema.  No joint effusions. Neurologic:  Normal speech and language. No gross focal neurologic deficits are appreciated. No gait instability. Skin:  Skin is warm, dry and intact. No rash noted. Psychiatric: Mood and affect are normal. Speech and behavior are normal.  ____________________________________________   LABS (all labs ordered are listed, but only abnormal results are displayed)  Labs Reviewed  CBC WITH DIFFERENTIAL/PLATELET - Abnormal; Notable for the following:       Result Value   RDW 15.0 (*)    All other components within normal limits  BASIC METABOLIC PANEL - Abnormal; Notable for the following:    Creatinine, Ser 1.01 (*)    All other components within normal limits   ____________________________________________  EKG   ____________________________________________  RADIOLOGY  DG Chest 2 View (Accession [id number]) (Order [id number])  Imaging  Date: 10/06/2015 Department: [facility] EMERGENCY DEPARTMENT Released By/Authorizing: Orbie Pyo, MD (auto-released)  PACS Images   Show images for DG Chest 2 View  Study Result   CLINICAL DATA:  Severe eye swelling.  EXAM: CHEST  2 VIEW  COMPARISON:  Radiographs of Jul 16, 2015.  FINDINGS: The heart size and mediastinal contours are within normal limits. Both lungs are clear. No pneumothorax or pleural effusion is noted. The visualized skeletal structures are unremarkable.  IMPRESSION: No active cardiopulmonary disease.   Electronically Signed   By: Marijo Conception, M.D.   On: 10/06/2015 16:17     ____________________________________________   PROCEDURES  Procedure(s) performed:   Procedures  Critical Care performed:   ____________________________________________   INITIAL  IMPRESSION / ASSESSMENT AND PLAN / ED COURSE  Pertinent labs & imaging results that were available during my care of the patient were reviewed by me and considered in my medical decision making (see chart for details).  ----------------------------------------- 4:50 PM on 10/06/2015 -----------------------------------------  Very reassuring lab workup. The patient likely has a viral etiology for her symptoms are because her COPD history and also that she still smokes 2 packs a day of cigarettes I will be prescribing her azithromycin as an outpatient. I explained her results as well as diagnosis and she is understanding and wanted to comply. She'll be discharged home.  Clinical Course  ____________________________________________   FINAL CLINICAL IMPRESSION(S) / ED DIAGNOSES  Upper respiratory infection.    NEW MEDICATIONS STARTED DURING THIS VISIT:  New Prescriptions   No medications on file     Note:  This document was prepared using Dragon voice recognition software and may include unintentional dictation errors.    Orbie Pyo, MD 10/06/15 1650  Patient says that she is already on doxycycline for skin infection. She will continue this dose. She said that she already took her dose of doxycycline this morning and will continue on her Doxy at home.   Orbie Pyo, MD 10/06/15 1722  I ended up not giving the patient prescription and she understands the plan to continue on her at home Doxy as is.   Orbie Pyo, MD 10/06/15 [phone]

## 2015-10-06 NOTE — Telephone Encounter (Signed)
Patient called; her eyes are practically swollen shut; watering; I told her that doesn't sound like anything related to what I saw her for the other day; she could have an allergic reaction or conjunctivitis; go to urgent care

## 2015-10-06 NOTE — Telephone Encounter (Signed)
No, I'm not sending any pills for fluid She can use leg elevation, compression stockings, salt avoidance

## 2015-10-07 DIAGNOSIS — J449 Chronic obstructive pulmonary disease, unspecified: Secondary | ICD-10-CM | POA: Diagnosis not present

## 2015-10-07 DIAGNOSIS — L89892 Pressure ulcer of other site, stage 2: Secondary | ICD-10-CM | POA: Diagnosis not present

## 2015-10-07 DIAGNOSIS — G629 Polyneuropathy, unspecified: Secondary | ICD-10-CM | POA: Diagnosis not present

## 2015-10-07 DIAGNOSIS — M1991 Primary osteoarthritis, unspecified site: Secondary | ICD-10-CM | POA: Diagnosis not present

## 2015-10-07 DIAGNOSIS — G8929 Other chronic pain: Secondary | ICD-10-CM | POA: Diagnosis not present

## 2015-10-07 DIAGNOSIS — F319 Bipolar disorder, unspecified: Secondary | ICD-10-CM | POA: Diagnosis not present

## 2015-10-07 LAB — MICROALBUMIN / CREATININE URINE RATIO: CREATININE, URINE: 128 mg/dL (ref 20–320)

## 2015-10-10 ENCOUNTER — Telehealth: Payer: Self-pay | Admitting: Family Medicine

## 2015-10-10 DIAGNOSIS — J449 Chronic obstructive pulmonary disease, unspecified: Secondary | ICD-10-CM | POA: Diagnosis not present

## 2015-10-10 DIAGNOSIS — M1991 Primary osteoarthritis, unspecified site: Secondary | ICD-10-CM | POA: Diagnosis not present

## 2015-10-10 DIAGNOSIS — F319 Bipolar disorder, unspecified: Secondary | ICD-10-CM | POA: Diagnosis not present

## 2015-10-10 DIAGNOSIS — G8929 Other chronic pain: Secondary | ICD-10-CM | POA: Diagnosis not present

## 2015-10-10 DIAGNOSIS — G629 Polyneuropathy, unspecified: Secondary | ICD-10-CM | POA: Diagnosis not present

## 2015-10-10 DIAGNOSIS — L89892 Pressure ulcer of other site, stage 2: Secondary | ICD-10-CM | POA: Diagnosis not present

## 2015-10-12 ENCOUNTER — Other Ambulatory Visit: Payer: Self-pay | Admitting: Family Medicine

## 2015-10-12 ENCOUNTER — Telehealth: Payer: Self-pay | Admitting: Family Medicine

## 2015-10-12 DIAGNOSIS — L97512 Non-pressure chronic ulcer of other part of right foot with fat layer exposed: Secondary | ICD-10-CM | POA: Diagnosis not present

## 2015-10-13 ENCOUNTER — Other Ambulatory Visit: Payer: Self-pay | Admitting: Psychiatry

## 2015-10-14 DIAGNOSIS — G629 Polyneuropathy, unspecified: Secondary | ICD-10-CM | POA: Diagnosis not present

## 2015-10-14 DIAGNOSIS — G8929 Other chronic pain: Secondary | ICD-10-CM | POA: Diagnosis not present

## 2015-10-14 DIAGNOSIS — M1991 Primary osteoarthritis, unspecified site: Secondary | ICD-10-CM | POA: Diagnosis not present

## 2015-10-14 DIAGNOSIS — F319 Bipolar disorder, unspecified: Secondary | ICD-10-CM | POA: Diagnosis not present

## 2015-10-14 DIAGNOSIS — L89892 Pressure ulcer of other site, stage 2: Secondary | ICD-10-CM | POA: Diagnosis not present

## 2015-10-14 DIAGNOSIS — J449 Chronic obstructive pulmonary disease, unspecified: Secondary | ICD-10-CM | POA: Diagnosis not present

## 2015-10-14 NOTE — Telephone Encounter (Signed)
done

## 2015-10-22 DIAGNOSIS — G629 Polyneuropathy, unspecified: Secondary | ICD-10-CM | POA: Diagnosis not present

## 2015-10-22 DIAGNOSIS — Z8673 Personal history of transient ischemic attack (TIA), and cerebral infarction without residual deficits: Secondary | ICD-10-CM | POA: Diagnosis not present

## 2015-10-22 DIAGNOSIS — I509 Heart failure, unspecified: Secondary | ICD-10-CM | POA: Diagnosis not present

## 2015-10-22 DIAGNOSIS — J449 Chronic obstructive pulmonary disease, unspecified: Secondary | ICD-10-CM | POA: Diagnosis not present

## 2015-10-22 DIAGNOSIS — G250 Essential tremor: Secondary | ICD-10-CM | POA: Diagnosis not present

## 2015-10-22 DIAGNOSIS — F17210 Nicotine dependence, cigarettes, uncomplicated: Secondary | ICD-10-CM | POA: Diagnosis not present

## 2015-10-22 DIAGNOSIS — F319 Bipolar disorder, unspecified: Secondary | ICD-10-CM | POA: Diagnosis not present

## 2015-10-22 DIAGNOSIS — K219 Gastro-esophageal reflux disease without esophagitis: Secondary | ICD-10-CM | POA: Diagnosis not present

## 2015-10-22 DIAGNOSIS — I252 Old myocardial infarction: Secondary | ICD-10-CM | POA: Diagnosis not present

## 2015-10-24 ENCOUNTER — Other Ambulatory Visit: Payer: Self-pay

## 2015-10-24 ENCOUNTER — Encounter: Payer: Self-pay | Admitting: Family Medicine

## 2015-10-24 DIAGNOSIS — E538 Deficiency of other specified B group vitamins: Secondary | ICD-10-CM | POA: Insufficient documentation

## 2015-10-24 MED ORDER — ALPRAZOLAM 0.5 MG PO TABS: 0.5000 mg | ORAL_TABLET | Freq: Three times a day (TID) | ORAL | 0 refills | Status: DC | PRN

## 2015-10-24 NOTE — Telephone Encounter (Signed)
faxed confirmed xana .5mg  id # [id number] order # [id number]

## 2015-10-24 NOTE — Telephone Encounter (Signed)
pt needs a refill on alprazolam .5mg  pt was last seen on  09-20-15 next appt  11-07-15

## 2015-10-26 ENCOUNTER — Emergency Department: Payer: Commercial Managed Care - HMO

## 2015-10-26 ENCOUNTER — Telehealth: Payer: Self-pay

## 2015-10-26 ENCOUNTER — Encounter: Payer: Self-pay | Admitting: Emergency Medicine

## 2015-10-26 DIAGNOSIS — R0602 Shortness of breath: Secondary | ICD-10-CM | POA: Insufficient documentation

## 2015-10-26 DIAGNOSIS — F17210 Nicotine dependence, cigarettes, uncomplicated: Secondary | ICD-10-CM | POA: Diagnosis not present

## 2015-10-26 DIAGNOSIS — F909 Attention-deficit hyperactivity disorder, unspecified type: Secondary | ICD-10-CM | POA: Insufficient documentation

## 2015-10-26 DIAGNOSIS — Z79899 Other long term (current) drug therapy: Secondary | ICD-10-CM | POA: Insufficient documentation

## 2015-10-26 DIAGNOSIS — J449 Chronic obstructive pulmonary disease, unspecified: Secondary | ICD-10-CM | POA: Diagnosis not present

## 2015-10-26 DIAGNOSIS — J45909 Unspecified asthma, uncomplicated: Secondary | ICD-10-CM | POA: Diagnosis not present

## 2015-10-26 DIAGNOSIS — Z791 Long term (current) use of non-steroidal anti-inflammatories (NSAID): Secondary | ICD-10-CM | POA: Insufficient documentation

## 2015-10-26 DIAGNOSIS — I252 Old myocardial infarction: Secondary | ICD-10-CM | POA: Diagnosis not present

## 2015-10-26 DIAGNOSIS — Z96652 Presence of left artificial knee joint: Secondary | ICD-10-CM | POA: Insufficient documentation

## 2015-10-26 DIAGNOSIS — R6889 Other general symptoms and signs: Secondary | ICD-10-CM | POA: Diagnosis not present

## 2015-10-26 DIAGNOSIS — R609 Edema, unspecified: Secondary | ICD-10-CM | POA: Diagnosis not present

## 2015-10-26 DIAGNOSIS — M7989 Other specified soft tissue disorders: Secondary | ICD-10-CM | POA: Diagnosis not present

## 2015-10-26 LAB — BASIC METABOLIC PANEL
ANION GAP: 7 (ref 5–15)
BUN: 9 mg/dL (ref 6–20)
CALCIUM: 9.2 mg/dL (ref 8.9–10.3)
CO2: 30 mmol/L (ref 22–32)
CREATININE: 1.01 mg/dL — AB (ref 0.44–1.00)
Chloride: 100 mmol/L — ABNORMAL LOW (ref 101–111)
Glucose, Bld: 105 mg/dL — ABNORMAL HIGH (ref 65–99)
Potassium: 3.9 mmol/L (ref 3.5–5.1)
Sodium: 137 mmol/L (ref 135–145)

## 2015-10-26 LAB — CBC
HCT: 35.9 % (ref 35.0–47.0)
HEMOGLOBIN: 12.6 g/dL (ref 12.0–16.0)
MCH: 31.8 pg (ref 26.0–34.0)
MCHC: 35.1 g/dL (ref 32.0–36.0)
MCV: 90.6 fL (ref 80.0–100.0)
PLATELETS: 156 10*3/uL (ref 150–440)
RBC: 3.96 MIL/uL (ref 3.80–5.20)
RDW: 14.8 % — ABNORMAL HIGH (ref 11.5–14.5)
WBC: 7.1 10*3/uL (ref 3.6–11.0)

## 2015-10-26 LAB — BRAIN NATRIURETIC PEPTIDE: B Natriuretic Peptide: 23 pg/mL (ref 0.0–100.0)

## 2015-10-26 LAB — TROPONIN I

## 2015-10-26 NOTE — Telephone Encounter (Signed)
Patient called is having a lot of swelling in feet,ankle and legs.  She says it will not go down with elevation and states does not eat salt.  She says has tried compression stockings and hurts to bad.  Please advise?

## 2015-10-26 NOTE — ED Triage Notes (Signed)
Pt reports swelling to legs bilaterally x 4 days.  Pt denies hx of CHF.  Pt reports SOB r/t pain.  Lung sounds clear upon auscultation.  PT NAD at this time, resp equal and unlabored, skin warm and dry.

## 2015-10-27 ENCOUNTER — Encounter: Payer: Self-pay | Admitting: Radiology

## 2015-10-27 ENCOUNTER — Emergency Department: Payer: Commercial Managed Care - HMO

## 2015-10-27 ENCOUNTER — Emergency Department
Admission: EM | Admit: 2015-10-27 | Discharge: 2015-10-27 | Disposition: A | Discharge status: Home / Self Care | Payer: Commercial Managed Care - HMO | Attending: Emergency Medicine | Admitting: Emergency Medicine

## 2015-10-27 DIAGNOSIS — R609 Edema, unspecified: Secondary | ICD-10-CM | POA: Diagnosis not present

## 2015-10-27 DIAGNOSIS — M7989 Other specified soft tissue disorders: Secondary | ICD-10-CM | POA: Diagnosis not present

## 2015-10-27 DIAGNOSIS — R0602 Shortness of breath: Secondary | ICD-10-CM

## 2015-10-27 LAB — FIBRIN DERIVATIVES D-DIMER (ARMC ONLY): FIBRIN DERIVATIVES D-DIMER (ARMC): 850 — AB (ref 0–499)

## 2015-10-27 MED ORDER — FUROSEMIDE 20 MG PO TABS: 20.0000 mg | ORAL_TABLET | Freq: Two times a day (BID) | ORAL | 0 refills | Status: DC

## 2015-10-27 MED ORDER — ACETAMINOPHEN 325 MG PO TABS
650.0000 mg | ORAL_TABLET | Freq: Once | ORAL | Status: AC
  Administered 2015-10-27: 650 mg via ORAL
  Filled 2015-10-27: qty 2

## 2015-10-27 MED ORDER — FUROSEMIDE 10 MG/ML IJ SOLN
20.0000 mg | Freq: Once | INTRAMUSCULAR | Status: AC
  Administered 2015-10-27: 20 mg via INTRAVENOUS
  Filled 2015-10-27: qty 4

## 2015-10-27 MED ORDER — IOPAMIDOL (ISOVUE-370) INJECTION 76%
100.0000 mL | Freq: Once | INTRAVENOUS | Status: AC | PRN
  Administered 2015-10-27: 100 mL via INTRAVENOUS

## 2015-10-27 MED ORDER — IPRATROPIUM-ALBUTEROL 0.5-2.5 (3) MG/3ML IN SOLN
3.0000 mL | Freq: Once | RESPIRATORY_TRACT | Status: AC
  Administered 2015-10-27: 3 mL via RESPIRATORY_TRACT
  Filled 2015-10-27: qty 3

## 2015-10-27 NOTE — ED Provider Notes (Signed)
Time Seen: Approximately 1232 I have reviewed the triage notes  Chief Complaint: Leg Swelling   History of Present Illness: Nicole Austin is a 54 y.o. female *who presents with bilateral lower extremity swelling over the last 4 days. She describes discomfort from the swelling in both legs. She denies any history of blood clots in the legs or lungs. She denies any significant shortness of breath though she does have a history of COPD. Patient was found to be hypoxic and states that she was told to stop her oxygen at home. She still does have oxygen tanks. She was found to be somewhat listless and had a pulse ox of 77% on room air. She corrected easily with supplemental oxygen here. She denies any chest or abdominal pain.   Past Medical History:  Diagnosis Date  . ADHD (attention deficit hyperactivity disorder)   . Arthritis   . Asthma   . Bipolar 1 disorder ([place])   . COPD (chronic obstructive pulmonary disease) ([place])   . Depression   . GERD (gastroesophageal reflux disease) 08/30/2015  . Low HDL (under 40) 08/30/2015  . MI (myocardial infarction) ([place])   . PTSD (post-traumatic stress disorder)   . Rhabdomyolysis   . Stroke [facility])     Patient Active Problem List   Diagnosis Date Noted  . Vitamin B12 deficiency 10/24/2015  . Abnormal weight gain 10/04/2015  . Fatigue 10/04/2015  . Encounter for medication monitoring 10/04/2015  . Hypoalbuminemia 10/04/2015  . Fibromyalgia 08/30/2015  . Low HDL (under 40) 08/30/2015  . GERD (gastroesophageal reflux disease) 08/30/2015  . Other specified behavioral and emotional disorders with onset usually occurring in childhood and adolescence 07/13/2015  . Acute myocardial infarction ([place]) 06/30/2015  . OP (osteoporosis) 06/30/2015  . Pain in thoracic spine 06/30/2015  . Personal history of other diseases of the musculoskeletal system and connective tissue 06/30/2015  . Primary malignant neoplasm ([place]) 06/30/2015  . Apnea, sleep 06/30/2015   . Controlled substance agreement signed 06/21/2015  . Other long term (current) drug therapy 06/21/2015  . HLD (hyperlipidemia) 05/13/2015  . Foot ulcer, left ([place]) 04/27/2015  . Chronic pain disorder 04/27/2015  . Chronic pain associated with significant psychosocial dysfunction 04/27/2015  . Non-pressure ulcer of lower extremity ([place]) 04/27/2015  . Stroke ([place]) 03/25/2015  . Cerebral infarction ([place]) 03/25/2015  . Morbid obesity ([place]) 12/22/2014  . Bipolar affective disorder, current episode depressed ([place]) 12/22/2014  . Gout 08/16/2014  . Muscle spasms of both lower extremities 08/16/2014  . Spasm 08/16/2014  . Back pain, chronic 08/13/2014  . Low back pain with sciatica 08/04/2014  . Current tobacco use 08/04/2014  . Polysubstance abuse 07/04/2014  . Psychoactive substance abuse 07/04/2014  . Anxiety, generalized 11/24/2013  . Imbalance 11/24/2013  . Blurred vision 11/24/2013  . Other abnormalities of gait and mobility 11/24/2013  . Unspecified visual disturbance 11/24/2013  . Moderate COPD (chronic obstructive pulmonary disease) ([place]) 11/18/2013  . HPV (human papilloma virus) infection 07/17/2013  . Arthritis of knee, degenerative 07/15/2013  . Personal history of other specified conditions 07/31/2011    Past Surgical History:  Procedure Laterality Date  . REPLACEMENT TOTAL KNEE Left     Past Surgical History:  Procedure Laterality Date  . REPLACEMENT TOTAL KNEE Left     Current Outpatient Rx  . Order #: [id number] Class: Print  . Order #: [id number] Class: Normal  . Order #: [id number] Class: Print  . Order #: [id number] Class: Historical Med  . Order #: [id number] Class: Normal  . Order #:  [id number] Class: Normal  . Order #: [id number] Class: Print  . Order #: [id number] Class: Normal  . Order #: [id number] Class: Print  . Order #: [id number] Class: Historical Med  . Order #: [id number] Class: Print  . Order #: [id number] Class: Historical Med  . Order #: [id number] Class: Normal   . Order #: [id number] Class: Normal  . Order #: [id number] Class: Normal  . Order #: [id number] Class: Normal  . Order #: [id number] Class: Print    Allergies:  Codeine  Family History: Family History  Problem Relation Age of Onset  . Hernia Mother   . Heart disease Mother   . OCD Mother   . Diabetes Mother   . Parkinson's disease Father   . Bipolar disorder Sister   . Schizophrenia Sister   . ADD / ADHD Sister   . Alcohol abuse Brother   . Bipolar disorder Sister   . Paranoid behavior Sister   . ADD / ADHD Sister     Social History: Social History  Substance Use Topics  . Smoking status: Current Every Day Smoker    Packs/day: 0.50    Types: Cigarettes    Start date: 10/11/1984  . Smokeless tobacco: Never Used  . Alcohol use No     Review of Systems:   10 point review of systems was performed and was otherwise negative:  Constitutional: No fever Eyes: No visual disturbances ENT: No sore throat, ear pain Cardiac: No chest pain Respiratory: No new shortness of breath, wheezing, or stridor Abdomen: No abdominal pain, no vomiting, No diarrhea Endocrine: No weight loss, No night sweats Extremities: Bilateral leg pain and swelling. No cyanosis Skin: No rashes, easy bruising Neurologic: No focal weakness, trouble with speech or swollowing Urologic: No dysuria, Hematuria, or urinary frequency   Physical Exam:  ED Triage Vitals  Enc Vitals Group     BP 10/26/15 2150 (!) 116/49     Pulse Rate 10/26/15 2150 93     Resp 10/26/15 2150 20     Temp 10/26/15 2150 98.3 F (36.8 C)     Temp Source 10/26/15 2150 Oral     SpO2 10/26/15 2150 95 %     Weight 10/26/15 2151 267 lb (121.1 kg)     Height 10/26/15 2151 5\' 1"  (1.549 m)     Head Circumference --      Peak Flow --      Pain Score 10/26/15 2150 9     Pain Loc --      Pain Edu? --      Excl. in [place]? --     General: Awake , Alert , and Oriented times 3; GCS 15 No signs of respiratory distress Head: Normal cephalic  , atraumatic Eyes: Pupils equal , round, reactive to light Nose/Throat: No nasal drainage, patent upper airway without erythema or exudate.  Neck: Supple, Full range of motion, No anterior adenopathy or palpable thyroid masses Lungs: Bilateral and expiratory wheezes , no rhonchi or rales Heart: Regular rate, regular rhythm without murmurs , gallops , or rubs Abdomen: Obese Soft, non tender without rebound, guarding , or rigidity; bowel sounds positive and symmetric in all 4 quadrants. No organomegaly .        Extremities: 2 plus symmetric pulses. Bilateral circumferential nonpitting edema no focal calf tenderness or swelling  Neurologic: normal ambulation, Motor symmetric without deficits, sensory intact Skin: warm, dry, no rashes   Labs:   All laboratory work was reviewed including any pertinent negatives or positives listed below:  [place] -  Abnormal; Notable for the following:       Result Value   Chloride 100 (*)    Glucose, Bld 105 (*)    Creatinine, Ser 1.01 (*)    All other components within normal limits  CBC - Abnormal; Notable for the following:    RDW 14.8 (*)    All other components within normal limits  TROPONIN I  BRAIN NATRIURETIC PEPTIDE  FIBRIN DERIVATIVES D-DIMER ([facility] ONLY)    EKG:  ED ECG REPORT I, Daymon Larsen, the attending physician, personally viewed and interpreted this ECG.  Date: 10/27/2015 EKG Time: 2207 Rate: 97 Rhythm: normal sinus rhythm QRS Axis: normal Intervals: normal ST/T Wave abnormalities: normal Conduction Disturbances: none Narrative Interpretation: unremarkable No acute ischemic changes  Radiology:  "Dg Chest 2 View  Result Date: 10/26/2015 CLINICAL DATA:  Leg swelling EXAM: CHEST  2 VIEW COMPARISON:  10/06/2015 FINDINGS: Borderline cardiomegaly. Lungs under aerated and grossly clear. Normal vascularity. No pneumothorax or pleural effusion. IMPRESSION: No active cardiopulmonary disease.  Electronically Signed   By: Marybelle Killings M.D.   On: 10/26/2015 22:37   Dg Chest 2 View  Result Date: 10/06/2015 CLINICAL DATA:  Severe eye swelling. EXAM: CHEST  2 VIEW COMPARISON:  Radiographs of Jul 16, 2015. FINDINGS: The heart size and mediastinal contours are within normal limits. Both lungs are clear. No pneumothorax or pleural effusion is noted. The visualized skeletal structures are unremarkable. IMPRESSION: No active cardiopulmonary disease. Electronically Signed   By: Marijo Conception, M.D.   On: 10/06/2015 16:17  "  Doppler studies of both legs showed no evidence of deep venous thrombosis  I personally reviewed the radiologic studies     ED Course:  Patient's stay here was uneventful and her hypoxia seemed to correct with a DuoNeb which she does have at home. She does have home oxygen available and she was advised to apply that she feels short of breath. Otherwise, she should continue with all her medications. Her swelling in her legs appears to be bilateral peripheral edema and she'll be started back up on diuretic therapy. She was advised to follow up with her primary physician for further outpatient follow-up and treatment Clinical Course     Assessment:  Peripheral edema Acute exacerbation of chronic obstructive pulmonary disease      Plan:  Outpatient " New Prescriptions   FUROSEMIDE (LASIX) 20 MG TABLET    Take 1 tablet (20 mg total) by mouth 2 (two) times daily.  " Patient was advised to return immediately if condition worsens. Patient was advised to follow up with their primary care physician or other specialized physicians involved in their outpatient care. The patient and/or family member/power of attorney had laboratory results reviewed at the bedside. All questions and concerns were addressed and appropriate discharge instructions were distributed by the nursing staff.             Daymon Larsen, MD 10/27/15 [phone]

## 2015-10-27 NOTE — ED Notes (Addendum)
Attempted to notify pt's sister of pt's d/c and need for transportation home. No answer on home phone, VMM left for sister to call hospital ED when she gets the message. Upon leaving room, pt stated she would wait in the lobby until she could contact her sister and stated "she won't wake up until 19".  Pt agreeable to wait in the lobby, but stated she "didn't understand why she would be expected to be there for over 3 hrs". This RN explained to the pt that she was medically cleared for d/c and that the room was needed for other pt's.  Pt verbalizes understanding and was taken to lobby in a w/c at this time to wait for transportation.

## 2015-10-27 NOTE — ED Notes (Signed)
Placed pt onto 2L due to stats at 77%. Pt states she has O2 at home.

## 2015-10-28 ENCOUNTER — Telehealth: Payer: Self-pay | Admitting: Emergency Medicine

## 2015-10-28 MED ORDER — FUROSEMIDE 20 MG PO TABS: 20.0000 mg | ORAL_TABLET | Freq: Every day | ORAL | 0 refills | Status: DC

## 2015-10-28 MED ORDER — POTASSIUM CHLORIDE ER 10 MEQ PO TBCR: EXTENDED_RELEASE_TABLET | ORAL | 0 refills | Status: DC

## 2015-10-28 NOTE — Telephone Encounter (Signed)
I already saw that she went to the ER about this and they put her back on BID furosemide Have her do that for just the one week that ER doctor wrote, and then decrease it to just once a day I don't see any additional potassium, so I'll send Rx for that too While she's on BID furosemide, have her take BID potassium When she drops down to once a day furosemide, have her go to once a day potassium

## 2015-10-28 NOTE — Telephone Encounter (Signed)
Pt.notified

## 2015-11-01 ENCOUNTER — Ambulatory Visit: Payer: Self-pay | Admitting: Family Medicine

## 2015-11-04 ENCOUNTER — Telehealth: Payer: Self-pay | Admitting: Family Medicine

## 2015-11-04 NOTE — Telephone Encounter (Signed)
She was supposed to see me Sept 5th but I see she canceled that appointment Please ask her to schedule and come in and we'll set her up for home health then; we need to do a face to face for that

## 2015-11-06 DIAGNOSIS — J449 Chronic obstructive pulmonary disease, unspecified: Secondary | ICD-10-CM | POA: Diagnosis not present

## 2015-11-07 ENCOUNTER — Ambulatory Visit: Payer: Self-pay | Admitting: Psychiatry

## 2015-11-07 ENCOUNTER — Telehealth: Payer: Self-pay | Admitting: Family Medicine

## 2015-11-07 NOTE — Telephone Encounter (Signed)
Thank you :)

## 2015-11-07 NOTE — Telephone Encounter (Signed)
Left detailed voicemail

## 2015-11-07 NOTE — Telephone Encounter (Signed)
Patient has appointment for the end of September (which is your first availability)

## 2015-11-07 NOTE — Telephone Encounter (Signed)
She told me she saw Dr. Rogers Blocker (ortho), but notes reviewed, she hasn't been there in over a year, and that was for her knee; they recommended weight loss, not pain clinic referral I'll see her back and we'll find another route besides pain management

## 2015-11-09 ENCOUNTER — Encounter: Payer: Self-pay | Admitting: Family Medicine

## 2015-11-09 DIAGNOSIS — R7989 Other specified abnormal findings of blood chemistry: Secondary | ICD-10-CM | POA: Insufficient documentation

## 2015-11-14 ENCOUNTER — Other Ambulatory Visit: Payer: Self-pay

## 2015-11-14 DIAGNOSIS — M62838 Other muscle spasm: Secondary | ICD-10-CM

## 2015-11-14 NOTE — Telephone Encounter (Signed)
Pt used to get through psych and she has a new one who states she would need to get refilled by PCP from now on.

## 2015-11-15 MED ORDER — CYCLOBENZAPRINE HCL 5 MG PO TABS: 5.0000 mg | ORAL_TABLET | Freq: Three times a day (TID) | ORAL | 2 refills | Status: DC | PRN

## 2015-11-15 NOTE — Telephone Encounter (Signed)
rx sent

## 2015-11-16 ENCOUNTER — Encounter: Payer: Self-pay | Admitting: Psychiatry

## 2015-11-16 ENCOUNTER — Ambulatory Visit (INDEPENDENT_AMBULATORY_CARE_PROVIDER_SITE_OTHER): Payer: Medicare HMO | Admitting: Psychiatry

## 2015-11-16 ENCOUNTER — Other Ambulatory Visit: Payer: Self-pay

## 2015-11-16 VITALS — BP 108/71 | HR 111 | Temp 97.7°F | Ht 61.0 in | Wt 264.2 lb

## 2015-11-16 DIAGNOSIS — F4310 Post-traumatic stress disorder, unspecified: Secondary | ICD-10-CM | POA: Diagnosis not present

## 2015-11-16 DIAGNOSIS — F3130 Bipolar disorder, current episode depressed, mild or moderate severity, unspecified: Secondary | ICD-10-CM | POA: Diagnosis not present

## 2015-11-16 MED ORDER — ALPRAZOLAM 0.5 MG PO TABS: 0.5000 mg | ORAL_TABLET | Freq: Two times a day (BID) | ORAL | 1 refills | Status: DC | PRN

## 2015-11-16 MED ORDER — QUETIAPINE FUMARATE ER 300 MG PO TB24: 300.0000 mg | ORAL_TABLET | Freq: Every day | ORAL | 1 refills | Status: DC

## 2015-11-16 MED ORDER — ESCITALOPRAM OXALATE 20 MG PO TABS: 20.0000 mg | ORAL_TABLET | Freq: Every day | ORAL | 2 refills | Status: DC

## 2015-11-16 MED ORDER — ATORVASTATIN CALCIUM 40 MG PO TABS: 40.0000 mg | ORAL_TABLET | Freq: Every day | ORAL | 4 refills | Status: DC

## 2015-11-16 MED ORDER — DIVALPROEX SODIUM 250 MG PO DR TAB
250.0000 mg | DELAYED_RELEASE_TABLET | Freq: Two times a day (BID) | ORAL | 1 refills | Status: DC
Start: 2015-11-16 — End: 2016-01-09

## 2015-11-16 NOTE — Progress Notes (Signed)
Patient ID: Nicole Austin, female   DOB: 09/14/1960, 55 y.o.   MRN: [id number] [facility] MD/PA/NP OP Progress Note  11/16/2015 2:26 PM Rajah Sera  MRN:  [id number]  Subjective:  Patient is a [name] with history of bipolar disorder and posttraumatic stress disorder who was seen for follow-up. She reported that she was tired and slept almost all day yesterday. She woke up 7 PM and did not realize that it was evening. She reported that she has been feeling depressed. She reported that she has been taking high doses of the Seroquel and does not want to have her medications adjusted. She reported that she is going to talk to her sister as her sister has been feeling sick. She reported that she is also getting letters from the [place] housing as the sprinkler was not about them". She will get it fixed. Patient currently lives by herself. She reported that she is compliant with her medications. She reported that she continues to have neuropathy but does not want to take any other medications. She has been taking Xanax 3 times daily as well. We discussed about her medications at this time and she is not interested in changing her medications.    She denied having any suicidal homicidal ideations or plans. We discussed about her medications in detail and she demonstrated understanding.     Chief Complaint: pain Chief Complaint    Follow-up; Medication Refill     Visit Diagnosis:     ICD-9-CM ICD-10-CM   1. PTSD (post-traumatic stress disorder) 309.81 F43.10   2. Bipolar I disorder, most recent episode depressed ([place]) 296.50 F31.30     Past Medical History:  Past Medical History:  Diagnosis Date  . ADHD (attention deficit hyperactivity disorder)   . Arthritis   . Asthma   . Bipolar 1 disorder ([place])   . COPD (chronic obstructive pulmonary disease) ([place])   . Depression   . GERD (gastroesophageal reflux disease) 08/30/2015  . Low HDL (under 40) 08/30/2015  . MI  (myocardial infarction) ([place])   . PTSD (post-traumatic stress disorder)   . Rhabdomyolysis   . Stroke [facility])     Past Surgical History:  Procedure Laterality Date  . REPLACEMENT TOTAL KNEE Left    Family History:  Family History  Problem Relation Age of Onset  . Hernia Mother   . Heart disease Mother   . OCD Mother   . Diabetes Mother   . Parkinson's disease Father   . Bipolar disorder Sister   . Schizophrenia Sister   . ADD / ADHD Sister   . Alcohol abuse Brother   . Bipolar disorder Sister   . Paranoid behavior Sister   . ADD / ADHD Sister    Social History:  Social History   Social History  . Marital status: Single    Spouse name: N/A  . Number of children: N/A  . Years of education: N/A   Social History Main Topics  . Smoking status: Current Every Day Smoker    Packs/day: 0.50    Types: Cigarettes    Start date: 10/11/1984  . Smokeless tobacco: Never Used  . Alcohol use No  . Drug use: No  . Sexual activity: No   Other Topics Concern  . None   Social History Narrative  . None   Additional History:   Assessment:   Musculoskeletal: Strength & Muscle Tone: within normal limits Gait & Station: limping Patient leans: N/A  Psychiatric Specialty Exam: Anxiety  Symptoms include insomnia and nervous/anxious behavior. Patient reports no suicidal ideas.    Depression         Associated symptoms include insomnia.  Associated symptoms include no suicidal ideas.  Past medical history includes anxiety.   Insomnia  PMH includes: depression.    Review of Systems  Psychiatric/Behavioral: Positive for depression. Negative for hallucinations, memory loss, substance abuse and suicidal ideas. The patient is nervous/anxious and has insomnia.     Blood pressure 108/71, pulse (!) 111, temperature 97.7 F (36.5 C), temperature source Oral, height 5\' 1"  (1.549 m), weight 264 lb 3.2 oz (119.8 kg).Body mass index is 49.92 kg/m.  General Appearance: Disheveled   Eye  Contact:  Good  Speech:  Clear and Coherent and Normal Rate  Volume:  Normal  Mood:  Anxious and Depressed  Affect:  Constricted   Thought Process:  Linear  Orientation:  Full (Time, Place, and Person)  Thought Content:  Negative  Suicidal Thoughts:  No  Homicidal Thoughts:  No  Memory:  Immediate;   Good Recent;   Good Remote;   Good  Judgement:  Good  Insight:  Good  Psychomotor Activity:  Negative  Concentration:  Good  Recall:  Good  Fund of Knowledge: Good  Language: Good  Akathisia:  Negative  Handed:  Right unknown  AIMS (if indicated):  Not done  Assets:  Communication Skills Housing Social Support  ADL's:  Intact  Cognition: WNL  Sleep:  Good   Is the patient at risk to self?  No. Has the patient been a risk to self in the past 6 months?  No. Has the patient been a risk to self within the distant past?  Yes.   Is the patient a risk to others?  No. Has the patient been a risk to others in the past 6 months?  No. Has the patient been a risk to others within the distant past?  No.  Current Medications: Current Outpatient Prescriptions  Medication Sig Dispense Refill  . albuterol (PROVENTIL HFA;VENTOLIN HFA) 108 (90 Base) MCG/ACT inhaler Inhale 2 puffs into the lungs every 6 (six) hours as needed for wheezing or shortness of breath. 1 Inhaler 0  . albuterol (PROVENTIL) (2.5 MG/3ML) 0.083% nebulizer solution Take 3 mLs (2.5 mg total) by nebulization every 4 (four) hours as needed for wheezing or shortness of breath. 75 mL 0  . ALPRAZolam (XANAX) 0.5 MG tablet Take 1 tablet (0.5 mg total) by mouth 3 (three) times daily as needed for anxiety. 90 tablet 0  . atorvastatin (LIPITOR) 40 MG tablet Take 40 mg by mouth daily.    . cyclobenzaprine (FLEXERIL) 5 MG tablet Take 1 tablet (5 mg total) by mouth 3 (three) times daily as needed for muscle spasms. 90 tablet 2  . divalproex (DEPAKOTE) 250 MG DR tablet Take 1 tablet (250 mg total) by mouth 2 (two) times daily. 60 tablet 1   . doxycycline (VIBRAMYCIN) 100 MG capsule Take 1 capsule (100 mg total) by mouth 2 (two) times daily. 14 capsule 0  . escitalopram (LEXAPRO) 20 MG tablet Take 1 tablet (20 mg total) by mouth daily. 30 tablet 2  . lidocaine (XYLOCAINE) 5 % ointment Apply 1 application topically as needed for mild pain.    Marland Kitchen LYRICA 200 MG capsule Take 1 capsule (200 mg total) by mouth 2 (two) times daily. 60 capsule 2  . Multiple Vitamin (MULTIVITAMIN WITH MINERALS) TABS tablet Take 1 tablet by mouth daily.    . naproxen (NAPROSYN)  375 MG tablet Take 1 tablet (375 mg total) by mouth 2 (two) times daily with a meal. Take 81 mg coated aspirin daily one hour PRIOR to morning naproxen 60 tablet 5  . potassium chloride (KLOR-CON 10) 10 MEQ tablet One by mouth twice a day while on furosemide twice a day (for five more days), then just once a day 40 tablet 0  . QUEtiapine (SEROQUEL XR) 300 MG 24 hr tablet Take 1 tablet (300 mg total) by mouth at bedtime. 30 tablet 1  . ranitidine (ZANTAC) 150 MG tablet Take 1 tablet (150 mg total) by mouth 2 (two) times daily. 60 tablet 2  . Tiotropium Bromide Monohydrate (SPIRIVA RESPIMAT) 2.5 MCG/ACT AERS Inhale 2 puffs into the lungs daily. 4 g 11  . traMADol (ULTRAM) 50 MG tablet Take 1 tablet (50 mg total) by mouth every 12 (twelve) hours as needed. 60 tablet 0  . furosemide (LASIX) 20 MG tablet Take 1 tablet (20 mg total) by mouth daily. (after finishing the BID prescription) 30 tablet 0   No current facility-administered medications for this visit.     Medical Decision Making:  Established Problem, Stable/Improving (1) and Review or order clinical lab tests (1)  Treatment Plan Summary:Medication management    Bipolar disorder-continue Seroquel XR at 300 mg at bedtime.   Discussed with patient about her Xanax doseI will decrease her Xanax dose to twice a day as she has been sleeping a lot and she agreed with the plan..   Continue her Lexapro 20 mg a day. She is on Depakote 250  mg twice a day medication refills.   PTSD-stable. Will continue the Lexapro and Seroquel.  Patient will follow up in 1 months or earlier depending on her symptoms    More than 50% of the time spent in psychoeducation, counseling and coordination of care.    This note was generated in part or whole with voice recognition software. Voice regonition is usually quite accurate but there are transcription errors that can and very often do occur. I apologize for any typographical errors that were not detected and corrected.    Rainey Pines, MD  11/16/2015, 2:26 PM

## 2015-11-16 NOTE — Telephone Encounter (Signed)
reveiwed last lipids and sgpt; rx approved

## 2015-11-17 ENCOUNTER — Other Ambulatory Visit: Payer: Self-pay | Admitting: Family Medicine

## 2015-11-17 NOTE — Telephone Encounter (Signed)
Patient has requested furosemide too soon; 30 pills on 10/28/15; not due yet

## 2015-11-21 DIAGNOSIS — D2372 Other benign neoplasm of skin of left lower limb, including hip: Secondary | ICD-10-CM | POA: Diagnosis not present

## 2015-11-21 DIAGNOSIS — M79672 Pain in left foot: Secondary | ICD-10-CM | POA: Diagnosis not present

## 2015-11-21 DIAGNOSIS — L988 Other specified disorders of the skin and subcutaneous tissue: Secondary | ICD-10-CM | POA: Diagnosis not present

## 2015-11-25 ENCOUNTER — Encounter: Payer: Self-pay | Admitting: Family Medicine

## 2015-11-25 ENCOUNTER — Ambulatory Visit (INDEPENDENT_AMBULATORY_CARE_PROVIDER_SITE_OTHER): Payer: Medicare HMO | Admitting: Family Medicine

## 2015-11-25 ENCOUNTER — Other Ambulatory Visit: Payer: Self-pay

## 2015-11-25 VITALS — BP 106/78 | HR 92 | Temp 97.7°F | Resp 18 | Ht 61.0 in | Wt 268.5 lb

## 2015-11-25 DIAGNOSIS — M47896 Other spondylosis, lumbar region: Secondary | ICD-10-CM | POA: Diagnosis not present

## 2015-11-25 DIAGNOSIS — Z720 Tobacco use: Secondary | ICD-10-CM

## 2015-11-25 DIAGNOSIS — M797 Fibromyalgia: Secondary | ICD-10-CM

## 2015-11-25 DIAGNOSIS — G894 Chronic pain syndrome: Secondary | ICD-10-CM | POA: Diagnosis not present

## 2015-11-25 DIAGNOSIS — G8929 Other chronic pain: Secondary | ICD-10-CM | POA: Diagnosis not present

## 2015-11-25 DIAGNOSIS — W19XXXA Unspecified fall, initial encounter: Secondary | ICD-10-CM | POA: Insufficient documentation

## 2015-11-25 DIAGNOSIS — W19XXXD Unspecified fall, subsequent encounter: Secondary | ICD-10-CM | POA: Diagnosis not present

## 2015-11-25 DIAGNOSIS — M5441 Lumbago with sciatica, right side: Secondary | ICD-10-CM | POA: Diagnosis not present

## 2015-11-25 DIAGNOSIS — Y92009 Unspecified place in unspecified non-institutional (private) residence as the place of occurrence of the external cause: Secondary | ICD-10-CM

## 2015-11-25 DIAGNOSIS — M47816 Spondylosis without myelopathy or radiculopathy, lumbar region: Secondary | ICD-10-CM | POA: Insufficient documentation

## 2015-11-25 MED ORDER — TIZANIDINE HCL 4 MG PO CAPS: 4.0000 mg | ORAL_CAPSULE | Freq: Three times a day (TID) | ORAL | 2 refills | Status: DC | PRN

## 2015-11-25 NOTE — Assessment & Plan Note (Signed)
Referral to pain clinic; benefitted in the past from injections and will see if we can have a pain management specialist take over her care

## 2015-11-25 NOTE — Progress Notes (Signed)
BP 106/78 (BP Location: Right Arm, Patient Position: Sitting, Cuff Size: Large)   Pulse 92   Temp 97.7 F (36.5 C) (Oral)   Resp 18   Ht 5\' 1"  (1.549 m)   Wt 268 lb 8 oz (121.8 kg)   LMP  (LMP Unknown)   SpO2 (!) 87%   BMI 50.73 kg/m    Subjective:    Patient ID: Nicole Austin, female    DOB: 11/14/1960, 55 y.o.   MRN: [id number]  HPI: Nicole Austin is a 55 y.o. female  Chief Complaint  Patient presents with  . Referral    [facility]; band for stomach  . Neck Pain   She says she was contacted by someone about getting a free back brace and a knee brace; she does not remember the name of the company; she has chronic back pain; she has pain in her lower back, feels like it going to separate from her body; when she is walking, it feels like her waist down it is going to separate; hurts so bad when walking that she has to stop sometimes; last PT was January 2017; she was in rehab to do PT after having pneumonia and sepsis; last xrays reviewed; MRI of lumbar spine Feb 2016:  CLINICAL DATA:  Right leg numbness and tingling. Unable to bear  weight or walker for 2 weeks.   EXAM:  MRI LUMBAR SPINE WITHOUT AND WITH CONTRAST   TECHNIQUE:  Multiplanar and multiecho pulse sequences of the lumbar spine were  obtained without and with intravenous contrast.   CONTRAST:  20 mL MultiHance   COMPARISON:  MRI thoracic spine without and with contrast  04/18/2014.   FINDINGS:  Normal signal is present in the conus medullaris which terminates at  L2-3. Marrow signal, vertebral body heights, and alignment are  normal.   Limited imaging of the abdomen is unremarkable. There is no  significant adenopathy.   The disc levels at L2-3 and above are normal.   L3-4: Mild disc bulging and facet hypertrophy is present. No  significant stenosis is present.   L4-5: A broad-based disc protrusion is present. Moderate facet  hypertrophy is evident bilaterally.  Prominent bilateral joint  effusions are noted.   L5-S1:  Mild disc bulging is present without significant stenosis.   IMPRESSION:  1. Leftward disc protrusion at L4-5 with moderate facet hypertrophy.  Although no significant stenosis is present. The large bilateral  facet effusions suggests the probability of motion at this level.  Upright, flexion, and extension views of the lumbar spine would  prove useful for evaluation of stability in the appropriate clinical  setting.  2. Mild disc bulging and facet hypertrophy at L3-4 and L5-S1 without  significant stenosis.    Electronically Signed    By: San Morelle M.D.    On: 04/20/2014 15:30   -----------------------------------  Neck pain radiating from the back of the neck down to the lower back; muscles are tight like a rubber band; twisted the rubber band too much she says and the nerves or tendons have stretched to their limit; tight; not doing anything like massage or biofreeze; using aleve and tylenol; neck and thoracic spine pain as well, and all the muscles around there are tight; tried flexeril but not helping; she used to see Dr. Lance Bosch and would like to see another pain clinic  She needs a referral to [facility]; they can help her with bathing, personal care; they will clean  her house; she cannot do it; she needs help with bathing, she cannot get in and out of the tub alone, scared that she might fall; fear of falling; she fell once with a chair tipping over in the shower; toileting is okay, problems reaching behind to wipe; trouble standing to fix meals; not able to do light household chores; she is not supposed to lift over five pounds  Thinking about quitting smoking  Depression screen [facility] 2/9 11/25/2015 08/29/2015 06/21/2015 01/31/2015 01/12/2015  Decreased Interest 0 0 1 2 0  Down, Depressed, Hopeless 0 0 1 3 0  PHQ - 2 Score 0 0 2 5 0  Altered sleeping - - 0 2 -  Tired, decreased energy - - 2 1 -    Change in appetite - - 2 0 -  Feeling bad or failure about yourself  - - 0 1 -  Trouble concentrating - - 1 1 -  Moving slowly or fidgety/restless - - 0 1 -  Suicidal thoughts - - 0 0 -  PHQ-9 Score - - 7 11 -  Difficult doing work/chores - - Somewhat difficult - -   Relevant past medical, surgical, family and social history reviewed Past Medical History:  Diagnosis Date  . ADHD (attention deficit hyperactivity disorder)   . Arthritis   . Asthma   . Bipolar 1 disorder ([place])   . COPD (chronic obstructive pulmonary disease) ([place])   . Depression   . GERD (gastroesophageal reflux disease) 08/30/2015  . Low HDL (under 40) 08/30/2015  . MI (myocardial infarction)   . PTSD (post-traumatic stress disorder)   . Rhabdomyolysis   . Stroke [facility])    Past Surgical History:  Procedure Laterality Date  . REPLACEMENT TOTAL KNEE Left    Family History  Problem Relation Age of Onset  . Hernia Mother   . Heart disease Mother   . OCD Mother   . Diabetes Mother   . Parkinson's disease Father   . Bipolar disorder Sister   . Schizophrenia Sister   . ADD / ADHD Sister   . Alcohol abuse Brother   . Bipolar disorder Sister   . Paranoid behavior Sister   . ADD / ADHD Sister    Social History  Substance Use Topics  . Smoking status: Current Every Day Smoker    Packs/day: 0.50    Types: Cigarettes    Start date: 10/11/1984  . Smokeless tobacco: Never Used  . Alcohol use No   Interim medical history since last visit reviewed. Allergies and medications reviewed  Review of Systems Per HPI unless specifically indicated above     Objective:    BP 106/78 (BP Location: Right Arm, Patient Position: Sitting, Cuff Size: Large)   Pulse 92   Temp 97.7 F (36.5 C) (Oral)   Resp 18   Ht 5\' 1"  (1.549 m)   Wt 268 lb 8 oz (121.8 kg)   LMP  (LMP Unknown)   SpO2 (!) 87%   BMI 50.73 kg/m   Wt Readings from Last 3 Encounters:  11/25/15 268 lb 8 oz (121.8 kg)  11/16/15 264 lb 3.2 oz (119.8 kg)   10/26/15 267 lb (121.1 kg)    Physical Exam  Constitutional: She appears well-developed and well-nourished.  Morbidly obese  HENT:  Mouth/Throat: Mucous membranes are normal.  Eyes: EOM are normal. No scleral icterus.  Cardiovascular: Normal rate and regular rhythm.   No extrasystoles are present.  Pulmonary/Chest: Effort normal. No accessory muscle usage. She has  no decreased breath sounds. She has no rhonchi.  Abdominal: She exhibits no distension.  Musculoskeletal: She exhibits no edema (no pitting pretibial edema).       Right foot: There is no tenderness and no swelling.  Skin: She is not diaphoretic. No pallor.  Psychiatric: Her behavior is normal. Her mood appears not anxious. Her affect is not blunt and not inappropriate. Her speech is not rapid and/or pressured. She does not exhibit a depressed mood.  Good eye contact with examiner; polite and cooperative      Assessment & Plan:   Problem List Items Addressed This Visit      Musculoskeletal and Integument   Facet hypertrophy of lumbar region - Primary    See MRI from Feb 2016; refer to pain clinic, physical therapy      Relevant Orders   Ambulatory referral to Physical Therapy   Ambulatory referral to Pain Clinic     Other   Morbid obesity ([place])    Encouraged weight loss; see after visit summary      Low back pain with sciatica    Right side; refer to physical therapy      Relevant Medications   tiZANidine (ZANAFLEX) 4 MG capsule   Other Relevant Orders   Ambulatory referral to Physical Therapy   Ambulatory referral to Pain Clinic   Ambulatory referral to Physical Therapy   Ambulatory referral to [place] of chronic pain; refer to PT, pain clinic, and PCS      Fall at home    Refer to PT, refer for PCS      Current tobacco use    Encouraged patient to quit smoking; smoking cessation may help with improved pain control, tissue oxygenation; see after visit summary       Chronic pain disorder    Referral to pain clinic; benefitted in the past from injections and will see if we can have a pain management specialist take over her care      Relevant Orders   Ambulatory referral to Physical Therapy   Ambulatory referral to Pain Clinic   Back pain, chronic    Reviewed MRI from Feb 2016; refer to PT; weight loss and smoking cessation urged      Relevant Medications   tiZANidine (ZANAFLEX) 4 MG capsule   Other Relevant Orders   Ambulatory referral to Physical Therapy   Ambulatory referral to Pain Clinic   Ambulatory referral to Physical Therapy   Ambulatory referral to Pain Clinic    Other Visit Diagnoses   None.      Follow up plan: Return 2-3 weeks, for smoking cessation.  An after-visit summary was printed and given to the patient at [place].  Please see the patient instructions which may contain other information and recommendations beyond what is mentioned above in the assessment and plan.  Meds ordered this encounter  Medications  . DISCONTD: tiZANidine (ZANAFLEX) 4 MG capsule    Sig: Take 1 capsule (4 mg total) by mouth every 8 (eight) hours as needed for muscle spasms. (STOP flexeril, cyclobenzaprine)    Dispense:  90 capsule    Refill:  2  . tiZANidine (ZANAFLEX) 4 MG capsule    Sig: Take 1 capsule (4 mg total) by mouth every 8 (eight) hours as needed for muscle spasms. (STOP flexeril, cyclobenzaprine)    Dispense:  90 capsule    Refill:  2   Orders Placed This Encounter  Procedures  . Ambulatory  referral to Physical Therapy  . Ambulatory referral to Pain Clinic

## 2015-11-25 NOTE — Assessment & Plan Note (Signed)
See MRI from Feb 2016; refer to pain clinic, physical therapy

## 2015-11-25 NOTE — Patient Instructions (Addendum)
Check out the information at familydoctor.org entitled "Nutrition for Weight Loss: What You Need to Know about Fad Diets" Try to lose between 1-2 pounds per week by taking in fewer calories and burning off more calories You can succeed by limiting portions, limiting foods dense in calories and fat, becoming more active, and drinking 8 glasses of water a day (64 ounces) Don't skip meals, especially breakfast, as skipping meals may alter your metabolism Do not use over-the-counter weight loss pills or gimmicks that claim rapid weight loss A healthy BMI (or body mass index) is between 18.5 and 24.9 You can calculate your ideal BMI at the [place] website ClubMon[employer]fr  I do encourage you to quit smoking Call [phone] to sign up for smoking cessation classes You can call 1-800-QUIT-NOW to talk with a smoking cessation coach  We'll have you see physical therapy for your back and neck Stop Flexeril (cyclobenzaprine) and use Zanaflex instead if needed for muscle spasms  We'll contact [place] for your personal care services Please check with staff about the referral that was made to the bariatric surgeon  Chronic Pain Chronic pain can be defined as pain that is off and on and lasts for 3-6 months or longer. Many things cause chronic pain, which can make it difficult to make a diagnosis. There are many treatment options available for chronic pain. However, finding a treatment that works well for you may require trying various approaches until the right one is found. Many people benefit from a combination of two or more types of treatment to control their pain. SYMPTOMS  Chronic pain can occur anywhere in the body and can range from mild to very severe. Some types of chronic pain include:  Headache.  Low back pain.  Cancer pain.  Arthritis pain.  Neurogenic pain. This is pain resulting from damage to nerves. People with chronic  pain may also have other symptoms such as:  Depression.  Anger.  Insomnia.  Anxiety. DIAGNOSIS  Your health care provider will help diagnose your condition over time. In many cases, the initial focus will be on excluding possible conditions that could be causing the pain. Depending on your symptoms, your health care provider may order tests to diagnose your condition. Some of these tests may include:   Blood tests.   CT scan.   MRI.   X-rays.   Ultrasounds.   Nerve conduction studies.  You may need to see a specialist.  TREATMENT  Finding treatment that works well may take time. You may be referred to a pain specialist. He or she may prescribe medicine or therapies, such as:   Mindful meditation or yoga.  Shots (injections) of numbing or pain-relieving medicines into the spine or area of pain.  Local electrical stimulation.  Acupuncture.   Massage therapy.   Aroma, color, light, or sound therapy.   Biofeedback.   Working with a physical therapist to keep from getting stiff.   Regular, gentle exercise.   Cognitive or behavioral therapy.   Group support.  Sometimes, surgery may be recommended.  HOME CARE INSTRUCTIONS   Take all medicines as directed by your health care provider.   Lessen stress in your life by relaxing and doing things such as listening to calming music.   Exercise or be active as directed by your health care provider.   Eat a healthy diet and include things such as vegetables, fruits, fish, and lean meats in your diet.   Keep all follow-up appointments with your health  care provider.   Attend a support group with others suffering from chronic pain. SEEK MEDICAL CARE IF:   Your pain gets worse.   You develop a new pain that was not there before.   You cannot tolerate medicines given to you by your health care provider.   You have new symptoms since your last visit with your health care provider.  SEEK  IMMEDIATE MEDICAL CARE IF:   You feel weak.   You have decreased sensation or numbness.   You lose control of bowel or bladder function.   Your pain suddenly gets much worse.   You develop shaking.  You develop chills.  You develop confusion.  You develop chest pain.  You develop shortness of breath.  MAKE SURE YOU:  Understand these instructions.  Will watch your condition.  Will get help right away if you are not doing well or get worse.   This information is not intended to replace advice given to you by your health care provider. Make sure you discuss any questions you have with your health care provider.   Document Released: 11/04/2001 Document Revised: 10/15/2012 Document Reviewed: 08/08/2012 Elsevier Interactive Patient Education 2016 [employer]. Smoking Cessation, Tips for Success If you are ready to quit smoking, congratulations! You have chosen to help yourself be healthier. Cigarettes bring nicotine, tar, carbon monoxide, and other irritants into your body. Your lungs, heart, and blood vessels will be able to work better without these poisons. There are many different ways to quit smoking. Nicotine gum, nicotine patches, a nicotine inhaler, or nicotine nasal spray can help with physical craving. Hypnosis, support groups, and medicines help break the habit of smoking. WHAT THINGS CAN I DO TO MAKE QUITTING EASIER?  Here are some tips to help you quit for good:  Pick a date when you will quit smoking completely. Tell all of your friends and family about your plan to quit on that date.  Do not try to slowly cut down on the number of cigarettes you are smoking. Pick a quit date and quit smoking completely starting on that day.  Throw away all cigarettes.   Clean and remove all ashtrays from your home, work, and car.  On a card, write down your reasons for quitting. Carry the card with you and read it when you get the urge to smoke.  Cleanse your body of  nicotine. Drink enough water and fluids to keep your urine clear or pale yellow. Do this after quitting to flush the nicotine from your body.  Learn to predict your moods. Do not let a bad situation be your excuse to have a cigarette. Some situations in your life might tempt you into wanting a cigarette.  Never have "just one" cigarette. It leads to wanting another and another. Remind yourself of your decision to quit.  Change habits associated with smoking. If you smoked while driving or when feeling stressed, try other activities to replace smoking. Stand up when drinking your coffee. Brush your teeth after eating. Sit in a different chair when you read the paper. Avoid alcohol while trying to quit, and try to drink fewer caffeinated beverages. Alcohol and caffeine may urge you to smoke.  Avoid foods and drinks that can trigger a desire to smoke, such as sugary or spicy foods and alcohol.  Ask people who smoke not to smoke around you.  Have something planned to do right after eating or having a cup of coffee. For example, plan to take a walk  or exercise.  Try a relaxation exercise to calm you down and decrease your stress. Remember, you may be tense and nervous for the first 2 weeks after you quit, but this will pass.  Find new activities to keep your hands busy. Play with a pen, coin, or rubber band. Doodle or draw things on paper.  Brush your teeth right after eating. This will help cut down on the craving for the taste of tobacco after meals. You can also try mouthwash.   Use oral substitutes in place of cigarettes. Try using lemon drops, carrots, cinnamon sticks, or chewing gum. Keep them handy so they are available when you have the urge to smoke.  When you have the urge to smoke, try deep breathing.  Designate your home as a nonsmoking area.  If you are a heavy smoker, ask your health care provider about a prescription for nicotine chewing gum. It can ease your withdrawal from  nicotine.  Reward yourself. Set aside the cigarette money you save and buy yourself something nice.  Look for support from others. Join a support group or smoking cessation program. Ask someone at home or at work to help you with your plan to quit smoking.  Always ask yourself, "Do I need this cigarette or is this just a reflex?" Tell yourself, "Today, I choose not to smoke," or "I do not want to smoke." You are reminding yourself of your decision to quit.  Do not replace cigarette smoking with electronic cigarettes (commonly called e-cigarettes). The safety of e-cigarettes is unknown, and some may contain harmful chemicals.  If you relapse, do not give up! Plan ahead and think about what you will do the next time you get the urge to smoke. HOW WILL I FEEL WHEN I QUIT SMOKING? You may have symptoms of withdrawal because your body is used to nicotine (the addictive substance in cigarettes). You may crave cigarettes, be irritable, feel very hungry, cough often, get headaches, or have difficulty concentrating. The withdrawal symptoms are only temporary. They are strongest when you first quit but will go away within 10-14 days. When withdrawal symptoms occur, stay in control. Think about your reasons for quitting. Remind yourself that these are signs that your body is healing and getting used to being without cigarettes. Remember that withdrawal symptoms are easier to treat than the major diseases that smoking can cause.  Even after the withdrawal is over, expect periodic urges to smoke. However, these cravings are generally short lived and will go away whether you smoke or not. Do not smoke! WHAT RESOURCES ARE AVAILABLE TO HELP ME QUIT SMOKING? Your health care provider can direct you to community resources or hospitals for support, which may include:  Group support.  Education.  Hypnosis.  Therapy.   This information is not intended to replace advice given to you by your health care provider.  Make sure you discuss any questions you have with your health care provider.   Document Released: 11/11/2003 Document Revised: 03/05/2014 Document Reviewed: 07/31/2012 Elsevier Interactive Patient Education 2016 [place] in the Home  Falls can cause injuries. They can happen to people of all ages. There are many things you can do to make your home safe and to help prevent falls.  WHAT CAN I DO ON THE OUTSIDE OF MY HOME?  Regularly fix the edges of walkways and driveways and fix any cracks.  Remove anything that might make you trip as you walk through a door, such as a  raised step or threshold.  Trim any bushes or trees on the path to your home.  Use bright outdoor lighting.  Clear any walking paths of anything that might make someone trip, such as rocks or tools.  Regularly check to see if handrails are loose or broken. Make sure that both sides of any steps have handrails.  Any raised decks and porches should have guardrails on the edges.  Have any leaves, snow, or ice cleared regularly.  Use sand or salt on walking paths during winter.  Clean up any spills in your garage right away. This includes oil or grease spills. WHAT CAN I DO IN THE BATHROOM?   Use night lights.  Install grab bars by the toilet and in the tub and shower. Do not use towel bars as grab bars.  Use non-skid mats or decals in the tub or shower.  If you need to sit down in the shower, use a plastic, non-slip stool.  Keep the floor dry. Clean up any water that spills on the floor as soon as it happens.  Remove soap buildup in the tub or shower regularly.  Attach bath mats securely with double-sided non-slip rug tape.  Do not have throw rugs and other things on the floor that can make you trip. WHAT CAN I DO IN THE BEDROOM?  Use night lights.  Make sure that you have a light by your bed that is easy to reach.  Do not use any sheets or blankets that are too big for your bed.  They should not hang down onto the floor.  Have a firm chair that has side arms. You can use this for support while you get dressed.  Do not have throw rugs and other things on the floor that can make you trip. WHAT CAN I DO IN THE KITCHEN?  Clean up any spills right away.  Avoid walking on wet floors.  Keep items that you use a lot in easy-to-reach places.  If you need to reach something above you, use a strong step stool that has a grab bar.  Keep electrical cords out of the way.  Do not use floor polish or wax that makes floors slippery. If you must use wax, use non-skid floor wax.  Do not have throw rugs and other things on the floor that can make you trip. WHAT CAN I DO WITH MY STAIRS?  Do not leave any items on the stairs.  Make sure that there are handrails on both sides of the stairs and use them. Fix handrails that are broken or loose. Make sure that handrails are as long as the stairways.  Check any carpeting to make sure that it is firmly attached to the stairs. Fix any carpet that is loose or worn.  Avoid having throw rugs at the top or bottom of the stairs. If you do have throw rugs, attach them to the floor with carpet tape.  Make sure that you have a light switch at the top of the stairs and the bottom of the stairs. If you do not have them, ask someone to add them for you. WHAT ELSE CAN I DO TO HELP PREVENT FALLS?  Wear shoes that:  Do not have high heels.  Have rubber bottoms.  Are comfortable and fit you well.  Are closed at the toe. Do not wear sandals.  If you use a stepladder:  Make sure that it is fully opened. Do not climb a closed stepladder.  Make sure  that both sides of the stepladder are locked into place.  Ask someone to hold it for you, if possible.  Clearly mark and make sure that you can see:  Any grab bars or handrails.  First and last steps.  Where the edge of each step is.  Use tools that help you move around (mobility aids)  if they are needed. These include:  Canes.  Walkers.  Scooters.  Crutches.  Turn on the lights when you go into a dark area. Replace any light bulbs as soon as they burn out.  Set up your furniture so you have a clear path. Avoid moving your furniture around.  If any of your floors are uneven, fix them.  If there are any pets around you, be aware of where they are.  Review your medicines with your doctor. Some medicines can make you feel dizzy. This can increase your chance of falling. Ask your doctor what other things that you can do to help prevent falls.   This information is not intended to replace advice given to you by your health care provider. Make sure you discuss any questions you have with your health care provider.   Document Released: 12/09/2008 Document Revised: 06/29/2014 Document Reviewed: 03/19/2014 Elsevier Interactive Patient Education [employer].

## 2015-11-25 NOTE — Assessment & Plan Note (Signed)
Refer to PT, refer for PCS

## 2015-11-25 NOTE — Assessment & Plan Note (Signed)
Right side; refer to physical therapy

## 2015-11-25 NOTE — Assessment & Plan Note (Signed)
Source of chronic pain; refer to PT, pain clinic, and PCS

## 2015-11-25 NOTE — Assessment & Plan Note (Signed)
Reviewed MRI from Feb 2016; refer to PT; weight loss and smoking cessation urged

## 2015-11-29 ENCOUNTER — Other Ambulatory Visit: Payer: Self-pay | Admitting: Psychiatry

## 2015-11-29 NOTE — Assessment & Plan Note (Signed)
Encouraged patient to quit smoking; smoking cessation may help with improved pain control, tissue oxygenation; see after visit summary

## 2015-11-29 NOTE — Assessment & Plan Note (Signed)
Encouraged weight loss; see after visit summary

## 2015-11-30 ENCOUNTER — Telehealth: Payer: Self-pay | Admitting: Family Medicine

## 2015-11-30 DIAGNOSIS — M5441 Lumbago with sciatica, right side: Secondary | ICD-10-CM

## 2015-11-30 DIAGNOSIS — L97911 Non-pressure chronic ulcer of unspecified part of right lower leg limited to breakdown of skin: Secondary | ICD-10-CM

## 2015-11-30 DIAGNOSIS — G8929 Other chronic pain: Secondary | ICD-10-CM

## 2015-11-30 DIAGNOSIS — M47816 Spondylosis without myelopathy or radiculopathy, lumbar region: Secondary | ICD-10-CM

## 2015-11-30 DIAGNOSIS — M797 Fibromyalgia: Secondary | ICD-10-CM

## 2015-11-30 NOTE — Assessment & Plan Note (Signed)
Refer for [facility]

## 2015-11-30 NOTE — Assessment & Plan Note (Signed)
Refer for [facility]

## 2015-11-30 NOTE — Telephone Encounter (Signed)
-----   Message from Cathrine Muster, [place] sent at 11/30/2015 12:25 PM EDT ----- This should be put in as a [facility] referral, which can be placed through EPIC ----- Message ----- From: Cathrine Muster, CMA Sent: 11/30/2015   8:33 AM To: Andria Frames Mock    ----- Message ----- From: Arnetha Courser, MD Sent: 11/29/2015   8:40 PM To: Cathrine Muster, CMA  I'd like to refer patient to personal care services, but don't know how to find that. (?) do you have any forms? Thanks!!

## 2015-11-30 NOTE — Assessment & Plan Note (Signed)
Refer for [facility]

## 2015-11-30 NOTE — Assessment & Plan Note (Signed)
Refer for [facility]

## 2015-11-30 NOTE — Telephone Encounter (Signed)
PT said that the dr Laurie Panda that she is to have home care again at [place] but they said that they have not recevied any orders. The phone number is [phone] this is for intake. fax# is [phone]. She said that you all discussed about her getting the lap band but she has not heard from them either. She has not heard anything about the referral of the lap band either. Also say that you were getting her to have physical therapy and she has not heard from that either.

## 2015-11-30 NOTE — Telephone Encounter (Signed)
Entered; thank you 

## 2015-12-02 NOTE — Telephone Encounter (Signed)
It was entered on 11/30/15 to [facility] as was suggested

## 2015-12-02 NOTE — Telephone Encounter (Signed)
I have took care of everything but the home health?

## 2015-12-08 ENCOUNTER — Telehealth: Payer: Self-pay

## 2015-12-08 NOTE — Telephone Encounter (Signed)
I put in a referral for personal care services on 11/30/15 Please check on that for her I will have to say "no" to the tramadol; I'm sorry; she has Xanax in her medicine list Thank you ------------------------------------------------- Copied from referral comments: Provider Comments [facility], [place] P  Note Text:  I want to refer my patient for personal care services and I was instructed by staff to use this method; thank you

## 2015-12-08 NOTE — Telephone Encounter (Signed)
Patient called stating that she tried to clean her house and now she is hurting really bad. She is requesting a referral for home health and wanted to know if she could get a rx for Tramadol until she could get in with the pain clinic or get a home health aide.  [facility] uses [facility] [employer] [facility]  Please advise.

## 2015-12-12 ENCOUNTER — Other Ambulatory Visit: Payer: Self-pay

## 2015-12-12 NOTE — Patient Outreach (Signed)
[place] [facility]) Care Management  12/12/2015  Nicole Austin 01-06-1961 [id number]  REFERRAL DATE: 12/01/15  REFERRAL SOURCE: Primary MD REFERRAL REASON: Referring patient for personal care services.   Telephone call to patient regarding primary MD. Unable to reach patient.  HIPAA compliant voice message left with call back phone number.   PLAN: RNCM will attempt 2nd telephone call within 1 week.   Quinn Plowman RN,BSN,CCM [facility] Telephonic  [phone]

## 2015-12-13 ENCOUNTER — Other Ambulatory Visit: Payer: Self-pay

## 2015-12-13 ENCOUNTER — Ambulatory Visit (INDEPENDENT_AMBULATORY_CARE_PROVIDER_SITE_OTHER): Payer: Medicare HMO | Admitting: Family Medicine

## 2015-12-13 ENCOUNTER — Encounter: Payer: Self-pay | Admitting: Family Medicine

## 2015-12-13 VITALS — BP 118/80 | HR 86 | Temp 98.0°F | Resp 16 | Wt 266.2 lb

## 2015-12-13 DIAGNOSIS — G8929 Other chronic pain: Secondary | ICD-10-CM | POA: Diagnosis not present

## 2015-12-13 DIAGNOSIS — M5441 Lumbago with sciatica, right side: Secondary | ICD-10-CM

## 2015-12-13 DIAGNOSIS — Z23 Encounter for immunization: Secondary | ICD-10-CM | POA: Diagnosis not present

## 2015-12-13 DIAGNOSIS — M62838 Other muscle spasm: Secondary | ICD-10-CM

## 2015-12-13 DIAGNOSIS — Z720 Tobacco use: Secondary | ICD-10-CM

## 2015-12-13 MED ORDER — POTASSIUM CHLORIDE ER 10 MEQ PO TBCR: EXTENDED_RELEASE_TABLET | ORAL | 2 refills | Status: DC

## 2015-12-13 MED ORDER — VARENICLINE TARTRATE 0.5 MG X 11 & 1 MG X 42 PO MISC: ORAL | 0 refills | Status: DC

## 2015-12-13 NOTE — Assessment & Plan Note (Addendum)
Discussed different forms of smoking cessation with the patient, and I encouraged cessation. Patient requests trial of Chantix.  I discussed common side effects including but not limited to nausea/vomiting, vivid dreams, behaviour changes, depression, suicidal ideation; also explained possible increased cardiovascular risk. After discussion, patient opts to try quitting with Chantix. The 1-800-QUIT-NOW number given in patient instructions. Patient was encouraged to choose a quit date about 1 week after starting medicine. More than 3 minutes spent on smoking cessation counseling

## 2015-12-13 NOTE — Patient Outreach (Signed)
[facility]) Care Management  12/13/2015  Nicole Austin 14-Nov-1960 [id number]   REFERRAL DATE: 12/01/15     REFERRAL SOURCE: Primary MD REFERRAL REASON: Referring patient for personal care services.  Second telephone call to patient regarding primary MD referral. Unable to reach patient. HIPAA compliant voice message left with call back phone number.   PLAN; RNCM will attempt 3rd telephone outreach to patient within 1 week.  Quinn Plowman RN,BSN,CCM [facility] Telephonic  [phone]

## 2015-12-13 NOTE — Patient Instructions (Addendum)
I do encourage you to quit smoking Call [phone] to sign up for smoking cessation classes You can call 1-800-QUIT-NOW to talk with a smoking cessation coach  Check out the information at familydoctor.org entitled "Nutrition for Weight Loss: What You Need to Know about Fad Diets" Try to lose between 1-2 pounds per week by taking in fewer calories and burning off more calories You can succeed by limiting portions, limiting foods dense in calories and fat, becoming more active, and drinking 8 glasses of water a day (64 ounces) Don't skip meals, especially breakfast, as skipping meals may alter your metabolism Do not use over-the-counter weight loss pills or gimmicks that claim rapid weight loss A healthy BMI (or body mass index) is between 18.5 and 24.9 You can calculate your ideal BMI at the [place] website ClubMonetize.fr  Call [facility] and see if they can do dry needling; try the stretching exercise I taught you (anchor the left hand and lean right ear toward your right shoulder) Ask up front about the August referral to the bariatric surgeon  Smoking Cessation, Tips for Success If you are ready to quit smoking, congratulations! You have chosen to help yourself be healthier. Cigarettes bring nicotine, tar, carbon monoxide, and other irritants into your body. Your lungs, heart, and blood vessels will be able to work better without these poisons. There are many different ways to quit smoking. Nicotine gum, nicotine patches, a nicotine inhaler, or nicotine nasal spray can help with physical craving. Hypnosis, support groups, and medicines help break the habit of smoking. WHAT THINGS CAN I DO TO MAKE QUITTING EASIER?  Here are some tips to help you quit for good:  Pick a date when you will quit smoking completely. Tell all of your friends and family about your plan to quit on that date.  Do not try to slowly cut down on the number  of cigarettes you are smoking. Pick a quit date and quit smoking completely starting on that day.  Throw away all cigarettes.   Clean and remove all ashtrays from your home, work, and car.  On a card, write down your reasons for quitting. Carry the card with you and read it when you get the urge to smoke.  Cleanse your body of nicotine. Drink enough water and fluids to keep your urine clear or pale yellow. Do this after quitting to flush the nicotine from your body.  Learn to predict your moods. Do not let a bad situation be your excuse to have a cigarette. Some situations in your life might tempt you into wanting a cigarette.  Never have "just one" cigarette. It leads to wanting another and another. Remind yourself of your decision to quit.  Change habits associated with smoking. If you smoked while driving or when feeling stressed, try other activities to replace smoking. Stand up when drinking your coffee. Brush your teeth after eating. Sit in a different chair when you read the paper. Avoid alcohol while trying to quit, and try to drink fewer caffeinated beverages. Alcohol and caffeine may urge you to smoke.  Avoid foods and drinks that can trigger a desire to smoke, such as sugary or spicy foods and alcohol.  Ask people who smoke not to smoke around you.  Have something planned to do right after eating or having a cup of coffee. For example, plan to take a walk or exercise.  Try a relaxation exercise to calm you down and decrease your stress. Remember, you may be tense  and nervous for the first 2 weeks after you quit, but this will pass.  Find new activities to keep your hands busy. Play with a pen, coin, or rubber band. Doodle or draw things on paper.  Brush your teeth right after eating. This will help cut down on the craving for the taste of tobacco after meals. You can also try mouthwash.   Use oral substitutes in place of cigarettes. Try using lemon drops, carrots, cinnamon  sticks, or chewing gum. Keep them handy so they are available when you have the urge to smoke.  When you have the urge to smoke, try deep breathing.  Designate your home as a nonsmoking area.  If you are a heavy smoker, ask your health care provider about a prescription for nicotine chewing gum. It can ease your withdrawal from nicotine.  Reward yourself. Set aside the cigarette money you save and buy yourself something nice.  Look for support from others. Join a support group or smoking cessation program. Ask someone at home or at work to help you with your plan to quit smoking.  Always ask yourself, "Do I need this cigarette or is this just a reflex?" Tell yourself, "Today, I choose not to smoke," or "I do not want to smoke." You are reminding yourself of your decision to quit.  Do not replace cigarette smoking with electronic cigarettes (commonly called e-cigarettes). The safety of e-cigarettes is unknown, and some may contain harmful chemicals.  If you relapse, do not give up! Plan ahead and think about what you will do the next time you get the urge to smoke. HOW WILL I FEEL WHEN I QUIT SMOKING? You may have symptoms of withdrawal because your body is used to nicotine (the addictive substance in cigarettes). You may crave cigarettes, be irritable, feel very hungry, cough often, get headaches, or have difficulty concentrating. The withdrawal symptoms are only temporary. They are strongest when you first quit but will go away within 10-14 days. When withdrawal symptoms occur, stay in control. Think about your reasons for quitting. Remind yourself that these are signs that your body is healing and getting used to being without cigarettes. Remember that withdrawal symptoms are easier to treat than the major diseases that smoking can cause.  Even after the withdrawal is over, expect periodic urges to smoke. However, these cravings are generally short lived and will go away whether you smoke or  not. Do not smoke! WHAT RESOURCES ARE AVAILABLE TO HELP ME QUIT SMOKING? Your health care provider can direct you to community resources or hospitals for support, which may include:  Group support.  Education.  Hypnosis.  Therapy.   This information is not intended to replace advice given to you by your health care provider. Make sure you discuss any questions you have with your health care provider.   Document Released: 11/11/2003 Document Revised: 03/05/2014 Document Reviewed: 07/31/2012 Elsevier Interactive Patient Education [employer].

## 2015-12-13 NOTE — Progress Notes (Signed)
BP 118/80   Pulse 86   Temp 98 F (36.7 C)   Resp 16   Wt 266 lb 3 oz (120.7 kg)   LMP  (LMP Unknown)   SpO2 99%   BMI 50.30 kg/m    Subjective:    Patient ID: Nicole Austin, female    DOB: [birth year], 55 y.o.   MRN: [id number]  HPI: Nicole Austin is a 55 y.o. female  Chief Complaint  Patient presents with  . Follow-up    2- 3 week check up for smoking cessation   . Medication Management    Pt is no longer taking tizanidine; not working    She is not taking the tizanidine any more; tight muscles in her neck and trapezius, left > right today, but it seems to go back and forth she says; she is taking also naproxen  Tobacco abuse; smoking 1.5 ppd; does not live with any smokers; friends are smokers Her sister did Chantix and got down to 2 cigarettes a day; patient is interested in quitting with Chantix Wants to have a smoke-free home  Obesity; she has lost 2.5 pounds over last 3 weeks; doing more chores at home; getting around without cane; cleaning out clutter; asked about gastric banding  Depression screen [facility] 2/9 12/13/2015 11/25/2015 08/29/2015 06/21/2015 01/31/2015  Decreased Interest 0 0 0 1 2  Down, Depressed, Hopeless 0 0 0 1 3  PHQ - 2 Score 0 0 0 2 5  Altered sleeping - - - 0 2  Tired, decreased energy - - - 2 1  Change in appetite - - - 2 0  Feeling bad or failure about yourself  - - - 0 1  Trouble concentrating - - - 1 1  Moving slowly or fidgety/restless - - - 0 1  Suicidal thoughts - - - 0 0  PHQ-9 Score - - - 7 11  Difficult doing work/chores - - - Somewhat difficult -   Relevant past medical, surgical, family and social history reviewed Past Medical History:  Diagnosis Date  . ADHD (attention deficit hyperactivity disorder)   . Arthritis   . Asthma   . Bipolar 1 disorder ([place])   . COPD (chronic obstructive pulmonary disease) ([place])   . Depression   . GERD (gastroesophageal reflux disease) 08/30/2015  . Low HDL (under 40) 08/30/2015  . MI  (myocardial infarction)   . PTSD (post-traumatic stress disorder)   . Rhabdomyolysis   . Stroke [facility])    Past Surgical History:  Procedure Laterality Date  . REPLACEMENT TOTAL KNEE Left    Family History  Problem Relation Age of Onset  . Hernia Mother   . Heart disease Mother   . OCD Mother   . Diabetes Mother   . Parkinson's disease Father   . Bipolar disorder Sister   . Schizophrenia Sister   . ADD / ADHD Sister   . Alcohol abuse Brother   . Bipolar disorder Sister   . Paranoid behavior Sister   . ADD / ADHD Sister    Social History  Substance Use Topics  . Smoking status: Current Every Day Smoker    Packs/day: 0.50    Types: Cigarettes    Start date: 10/11/1984  . Smokeless tobacco: Never Used  . Alcohol use No   Interim medical history since last visit reviewed. Allergies and medications reviewed  Review of Systems Per HPI unless specifically indicated above     Objective:    BP 118/80  Pulse 86   Temp 98 F (36.7 C)   Resp 16   Wt 266 lb 3 oz (120.7 kg)   LMP  (LMP Unknown)   SpO2 99%   BMI 50.30 kg/m   Wt Readings from Last 3 Encounters:  12/13/15 266 lb 3 oz (120.7 kg)  11/25/15 268 lb 8 oz (121.8 kg)  11/16/15 264 lb 3.2 oz (119.8 kg)    Physical Exam  Constitutional: She appears well-developed and well-nourished. No distress.  Eyes: EOM are normal. No scleral icterus.  Neck: No thyromegaly present.  Cardiovascular: Normal rate.   Pulmonary/Chest: Effort normal.  Abdominal: She exhibits no distension.  Musculoskeletal:       Right shoulder: She exhibits tenderness (over right trapezius).       Left shoulder: She exhibits tenderness (over left trapezius).  Skin: No pallor.  Psychiatric: She has a normal mood and affect. Her behavior is normal. Judgment and thought content normal. Her mood appears not anxious. She does not exhibit a depressed mood.      Assessment & Plan:   Problem List Items Addressed This Visit      Musculoskeletal  and Integument   Trapezius muscle spasm - Primary    Patient has quit taking tizanidine; she may continue naproxen, recommended gentle stretching; smoking cessation may help decresae pain threshold; I also suggested dry needling and gave her phone number to call for local PT office who can do this for her        Other   Morbid obesity ([place])    Encouragement given for patient to lose weight; she responded favorably to positive feedback      Current tobacco use    Discussed different forms of smoking cessation with the patient, and I encouraged cessation. Patient requests trial of Chantix.  I discussed common side effects including but not limited to nausea/vomiting, vivid dreams, behaviour changes, depression, suicidal ideation; also explained possible increased cardiovascular risk. After discussion, patient opts to try quitting with Chantix. The 1-800-QUIT-NOW number given in patient instructions. Patient was encouraged to choose a quit date about 1 week after starting medicine. More than 3 minutes spent on smoking cessation counseling      Back pain, chronic    No longer taking tizanidine; continue naproxen; encouraged gentle stretching, smoking cessation      Relevant Medications   aspirin EC 81 MG tablet    Other Visit Diagnoses    Needs flu shot       Relevant Orders   Flu Vaccine QUAD 36+ mos PF IM (Fluarix & Fluzone Quad PF) (Completed)   Need for diphtheria-tetanus-pertussis (Tdap) vaccine       Relevant Orders   Tdap vaccine greater than or equal to 7yo IM (Completed)      Follow up plan: Return 4-6 weeks, for smoking cessation.  An after-visit summary was printed and given to the patient at [place].  Please see the patient instructions which may contain other information and recommendations beyond what is mentioned above in the assessment and plan.  Meds ordered this encounter  Medications  . DISCONTD: tiZANidine (ZANAFLEX) 4 MG tablet  . varenicline (CHANTIX  STARTING MONTH PAK) 0.5 MG X 11 & 1 MG X 42 tablet    Sig: Take one 0.5 mg tablet by mouth once daily for 3 days, then increase to one 0.5 mg tablet twice daily for 4 days, then increase to one 1 mg tablet twice daily.    Dispense:  53 tablet  Refill:  0  . potassium chloride (KLOR-CON 10) 10 MEQ tablet    Sig: One by mouth daily    Dispense:  30 tablet    Refill:  2  . aspirin EC 81 MG tablet    Sig: Take 81 mg by mouth daily.    Orders Placed This Encounter  Procedures  . Flu Vaccine QUAD 36+ mos PF IM (Fluarix & Fluzone Quad PF)  . Tdap vaccine greater than or equal to 7yo IM

## 2015-12-14 ENCOUNTER — Other Ambulatory Visit: Payer: Self-pay

## 2015-12-14 ENCOUNTER — Telehealth: Payer: Self-pay | Admitting: Family Medicine

## 2015-12-14 DIAGNOSIS — G894 Chronic pain syndrome: Secondary | ICD-10-CM

## 2015-12-14 MED ORDER — ATORVASTATIN CALCIUM 40 MG PO TABS: 40.0000 mg | ORAL_TABLET | Freq: Every day | ORAL | 3 refills | Status: DC

## 2015-12-14 MED ORDER — LYRICA 200 MG PO CAPS: 200.0000 mg | ORAL_CAPSULE | Freq: Two times a day (BID) | ORAL | 2 refills | Status: DC

## 2015-12-14 NOTE — Telephone Encounter (Signed)
[place] web site reviewed; last fill reviewed; no unknown prescribers; Lyrica Rx approved Last SGPT and lipids reviewed; atorvastatin Rx approved

## 2015-12-14 NOTE — Telephone Encounter (Signed)
Pt said that the [facility] told her that they are not taking new patients. She called [phone]. Please advise.

## 2015-12-14 NOTE — Telephone Encounter (Signed)
The requested information was sent to [facility] for review by there pain clinic physicians.    They will contact us with an appt date and time.

## 2015-12-15 ENCOUNTER — Telehealth: Payer: Self-pay | Admitting: Family Medicine

## 2015-12-15 ENCOUNTER — Other Ambulatory Visit: Payer: Self-pay

## 2015-12-15 NOTE — Telephone Encounter (Signed)
Left voicemail to give [facility] a call back

## 2015-12-15 NOTE — Patient Outreach (Signed)
[place] [facility]) Care Management  12/15/2015  Latorria Muralles [place] 05/04/1960 [id number]  REFERRAL DATE: 12/01/15     REFERRAL SOURCE: Primary MD REFERRAL REASON: Referring patient for personal care services.   Third telephone call to patient regarding primary MD referral. Attempted call to listed home and mobile number.  Unable to reach patient. HIPAA compliant voice messages left at both listed numbers.  RNCM called patients primary MD office, Dr. Sanda Klein.  Spoke with Cassandra and informed her that contact with patient has been unsuccessful. Requested Dr. Sanda Klein be notified.    PLAN:RNCM will send patient [facility] care management outreach letter to attempt contact.  Quinn Plowman RN,BSN,CCM [facility] Telephonic  [phone]

## 2015-12-15 NOTE — Telephone Encounter (Signed)
Nicole Austin from [facility] states that she has been unable to reach patientt to offer [facility] services as requested by Dr Sanda Klein referral.  [phone]

## 2015-12-17 ENCOUNTER — Encounter: Payer: Self-pay | Admitting: Family Medicine

## 2015-12-17 DIAGNOSIS — M62838 Other muscle spasm: Secondary | ICD-10-CM | POA: Insufficient documentation

## 2015-12-17 NOTE — Assessment & Plan Note (Signed)
Encouragement given for patient to lose weight; she responded favorably to positive feedback

## 2015-12-17 NOTE — Assessment & Plan Note (Addendum)
Patient has quit taking tizanidine; she may continue naproxen, recommended gentle stretching; smoking cessation may help decresae pain threshold; I also suggested dry needling and gave her phone number to call for local PT office who can do this for her

## 2015-12-17 NOTE — Assessment & Plan Note (Signed)
No longer taking tizanidine; continue naproxen; encouraged gentle stretching, smoking cessation

## 2015-12-20 ENCOUNTER — Other Ambulatory Visit: Payer: Self-pay

## 2015-12-20 NOTE — Patient Outreach (Signed)
[place] [facility]) Care Management  12/20/2015  Nicole Austin 11/26/1960 [id number]   Telephone call to patient regarding primary MD referral.  Unable to reach patient. HIPAA compliant voice message left with call back phone number.   RNCM contacted Dr. Delight Ovens office and spoke with [place].  Notified RNCM has not been able to contact patient at phone numbers listed.  [name] requested patient be contacted on her sisters phone at [phone].  Informed [facility] care management outreach letter will be sent to patient to attempt contact as well. RNCM called patients sister's phone as requested.  Unable to reach. HIPAA compliant voice message left with call back phone number .  PLAN: RNCM will send patient [facility] care management outreach letter to attempt contact.  Quinn Plowman RN,BSN,CCM [facility] Telephonic  [phone]

## 2015-12-22 ENCOUNTER — Ambulatory Visit: Payer: Commercial Managed Care - HMO | Attending: Family Medicine

## 2015-12-26 ENCOUNTER — Other Ambulatory Visit: Payer: Self-pay

## 2015-12-27 NOTE — Patient Outreach (Signed)
[name]) Care Management  12/27/2015  Nicole Austin 10/15/1960 [id number]  Late entry for 12/26/15 REFERRAL DATE: 12/01/15 REFERRAL SOURCE: Primary MD REFERRAL REASON: Referring patient for personal care services:  CONSENT:  Self / patient  PROVIDER:  Dr. Sanda Klein - primary MD Dr. Elvina Mattes - podiatry Dr. Rainey Pines - psychiatrist  SUBJECTIVE:  Received incoming call from patient. HIPAA verified.  Discussed and offered [facility] care management services to patient. Patient agreed to [facility] care management services.  Patient states she is overweight.  Patient reports she is going to have the band weight surgery. Patient states she is starting the process to prepare for the surgery.  Patient states she has goals she has to meet.  Patient states she has to start trying to lose weight on her own and stop smoking. Patient states she has been taking Chantix for 2 weeks. Patient states she has a total knee replacement 3 years ago on her left leg. Patient states she is now having the same problems from her right leg.  Patient states she is suppose to have physical therapy but it has not started yet.   Patient states due to her knee pain she is afraid to get in the shower. Patient states she is afraid she may fall. Patient states it is hard for her to get around at times.  Patient states she does use a cane for ambulation. Patient states she previously had gangrene in her 2 end toes of her right foot.  Patient states she has been seeing Dr. Elvina Mattes for this. Patient states the gangrene has resolved with no removal of her toes.  Patient states while she was being treated for the gangrene she gained 22 lbs in 2 months.  Patient states she has problems managing her personal care.  Patient states she has problems cleaning herself well and is unable to do house cleaning. Patient reports she has chronic back pain.  Patient states it depends on how she feels determines how much she is able to do with  home maintenance.   Patient states she has a history of heart attack, stroke, and has COPD. Patient states she takes her inhalers and uses her nebulizer. Patient states she does not have to use oxygen anymore.  Patient states she lost her mother in December 2016. Patient states she has still not gotten over her mothers death. Patient reports she has ADD and she is bipolar. Patient states she goes to counseling at [facility] every 3 months and is see by Dr. Gretel Acre.  Patient reports she is on approximately 19 medications. Patient states she is able to afford her medications.   ASSESSMENT: Patient will benefit from referral to social worker to assist  With community resources for ADL's and house keeping. Patient will benefit from referral to community case manager for fall safety evaluation and COPD education/ management.   Referral to pharmacy due to polypharmacy / smoking cessation  PLAN; RNCM will refer patient to community case manager, Education officer, museum, and pharmacy.

## 2015-12-28 ENCOUNTER — Ambulatory Visit: Payer: Commercial Managed Care - HMO

## 2015-12-28 ENCOUNTER — Other Ambulatory Visit: Payer: Self-pay | Admitting: *Deleted

## 2015-12-28 NOTE — Patient Outreach (Addendum)
[facility]) Care Management  12/28/2015  Nicole Austin [birth year] [id number]   This patient was referred to this social worker to assist with a referral for personal care assistance.  Per patient, she is unable to complete her ADL's and fears that she will fall. Per patient, she has had personal care services in the past, however she went to live with her sister for approximately 3 months and her case was closed.  Patient would like assistance to re-apply. Patient also states that she is preparing to have lap-band surgery and will need extra assistance in the home   Plan:  This social worker will fax the request form for personal care services to patient's providers office to complete.   [name] [facility] Care Management [phone]

## 2015-12-28 NOTE — Patient Outreach (Signed)
[facility]) Care Management  12/28/2015  Alyna Mcnaney Radoncic May 14, 1960 [id number]   Patient referred to this social worker to assist with personal care services in the home.  HIPPA complaint voicemail message left requesting a return call.   [name] [facility] Care Management [phone]

## 2015-12-29 ENCOUNTER — Ambulatory Visit: Payer: Medicare HMO | Admitting: Psychiatry

## 2016-01-03 ENCOUNTER — Other Ambulatory Visit: Payer: Self-pay | Admitting: *Deleted

## 2016-01-03 NOTE — Patient Outreach (Signed)
Attempt made to contact pt, follow up on referral received from [place] 11/1 for community nurse case management services.   HIPAA compliant voice message left with contact name and number..     Plan: If no response to voice message left, plan to follow up again within 2  days.     Zara Chess.   [place] Care Management  [phone]

## 2016-01-04 ENCOUNTER — Ambulatory Visit: Payer: Medicare HMO | Admitting: Psychiatry

## 2016-01-04 ENCOUNTER — Other Ambulatory Visit: Payer: Self-pay | Admitting: *Deleted

## 2016-01-04 NOTE — Patient Outreach (Signed)
Late entry- 11/7, received a return phone call from pt to voice message left earlier by RN CM- following up on referral from [facility] telephonic RN CM.  Spoke with pt, HIPAA verified, discussed purpose of calling her earlier, f/u on referral received.  Pt reports she is having the bariatric surgery with the band, looking forward to it, to go for  registration 11/15.  Pt reports she is to f/u with [employer] tomorrow, will find out if she has to move or can stay.   RN CM discussed with pt doing a home visit to which pt agreed.    Plan: As discussed, plan to do a home visit this week.    Zara Chess.   [place] Care Management  [phone]

## 2016-01-05 ENCOUNTER — Encounter: Payer: Self-pay | Admitting: Family Medicine

## 2016-01-05 ENCOUNTER — Ambulatory Visit: Payer: Self-pay | Admitting: *Deleted

## 2016-01-05 ENCOUNTER — Ambulatory Visit (INDEPENDENT_AMBULATORY_CARE_PROVIDER_SITE_OTHER): Payer: Medicare HMO | Admitting: Family Medicine

## 2016-01-05 ENCOUNTER — Ambulatory Visit: Payer: Commercial Managed Care - HMO | Admitting: *Deleted

## 2016-01-05 DIAGNOSIS — G8929 Other chronic pain: Secondary | ICD-10-CM | POA: Diagnosis not present

## 2016-01-05 DIAGNOSIS — M25561 Pain in right knee: Secondary | ICD-10-CM

## 2016-01-05 MED ORDER — NAPROXEN 500 MG PO TABS: 500.0000 mg | ORAL_TABLET | Freq: Two times a day (BID) | ORAL | 0 refills | Status: DC

## 2016-01-05 MED ORDER — NAPROXEN 375 MG PO TABS: 375.0000 mg | ORAL_TABLET | Freq: Two times a day (BID) | ORAL | 2 refills | Status: DC

## 2016-01-05 NOTE — Assessment & Plan Note (Addendum)
Refer to ortho for evaluation; she has had total left knee replacement, likely has significant OA in the right knee as well from her morbid obesity; with her level of pain and subjective symptoms, she may also have internal derangement with ligamentous injury or meniscal injury; ice; since she is on benzo, I am not comfortable giving her narcotic for her pain; she understands; will treat with NSAID Rx; do not use other OTC NSAIDs; I initially sent 500 mg BID Rx to pharmacy, but decided to use the 375 mg BID dosing instead and printed that Rx for patient, handed it to her, and circled the instructions to pharmacy to cancel the 500 mg Rx

## 2016-01-05 NOTE — Patient Instructions (Signed)
We'll have you see the orthopaedist Use the naproxen 375 mg twice a day if needed for pain Ice 15-20 minutes, every 3-4 hours while awake; use protective cloth between skin and ice

## 2016-01-05 NOTE — Progress Notes (Signed)
BP 134/72   Pulse 93   Temp 98.3 F (36.8 C) (Oral)   Resp 16   Wt 268 lb (121.6 kg)   LMP  (LMP Unknown)   SpO2 93%   BMI 50.64 kg/m    Subjective:    Patient ID: Nicole Austin, female    DOB: 1960-10-13, 55 y.o.   MRN: [id number]  HPI: Nicole Austin is a 55 y.o. female  Chief Complaint  Patient presents with  . Knee Pain    right knee   She has trouble with her right knee She has arthritis, torn meniscus in the left knee, then now cartilage, then had total knee replacement on the left Now the right hurts and aches to the bone; hurts so bad If you can imagine a tooth that needs a root canal She limps now If she tries to stand on it, she's going down It buckles and gives way If she has been sitting and then gets up, feels like it tries to twist, feels like the torn meniscus Uses ice and ace bandages Going on for a month Feels like a snap; wants to go backwards; popping  Depression screen [facility] 2/9 01/05/2016 12/26/2015 12/13/2015 11/25/2015 08/29/2015  Decreased Interest 0 1 0 0 0  Down, Depressed, Hopeless 1 0 0 0 0  PHQ - 2 Score 1 1 0 0 0  Altered sleeping - - - - -  Tired, decreased energy - - - - -  Change in appetite - - - - -  Feeling bad or failure about yourself  - - - - -  Trouble concentrating - - - - -  Moving slowly or fidgety/restless - - - - -  Suicidal thoughts - - - - -  PHQ-9 Score - - - - -  Difficult doing work/chores - - - - -  Some recent data might be hidden    No flowsheet data found.  Relevant past medical, surgical, family and social history reviewed Past Medical History:  Diagnosis Date  . ADHD (attention deficit hyperactivity disorder)   . Arthritis   . Asthma   . Bipolar 1 disorder ([place])   . COPD (chronic obstructive pulmonary disease) ([place])   . Depression   . GERD (gastroesophageal reflux disease) 08/30/2015  . Low HDL (under 40) 08/30/2015  . MI (myocardial infarction)   . PTSD (post-traumatic stress disorder)   .  Rhabdomyolysis   . Stroke [facility])    Past Surgical History:  Procedure Laterality Date  . REPLACEMENT TOTAL KNEE Left    Family History  Problem Relation Age of Onset  . Hernia Mother   . Heart disease Mother   . OCD Mother   . Diabetes Mother   . Parkinson's disease Father   . Bipolar disorder Sister   . Schizophrenia Sister   . ADD / ADHD Sister   . Alcohol abuse Brother   . Bipolar disorder Sister   . Paranoid behavior Sister   . ADD / ADHD Sister    Social History  Substance Use Topics  . Smoking status: Current Every Day Smoker    Packs/day: 0.50    Types: Cigarettes    Start date: 10/11/1984  . Smokeless tobacco: Never Used  . Alcohol use No    Interim medical history since last visit reviewed. Allergies and medications reviewed  Review of Systems Per HPI unless specifically indicated above     Objective:    BP 134/72   Pulse 93  Temp 98.3 F (36.8 C) (Oral)   Resp 16   Wt 268 lb (121.6 kg)   LMP  (LMP Unknown)   SpO2 93%   BMI 50.64 kg/m   Wt Readings from Last 3 Encounters:  01/05/16 268 lb (121.6 kg)  12/26/15 266 lb (120.7 kg)  12/13/15 266 lb 3 oz (120.7 kg)    Physical Exam  Constitutional: She appears well-developed and well-nourished. No distress.  Eyes: EOM are normal. No scleral icterus.  Neck: No thyromegaly present.  Cardiovascular: Normal rate.   Pulmonary/Chest: Effort normal.  Abdominal: She exhibits no distension.  Musculoskeletal:       Right knee: She exhibits decreased range of motion. She exhibits no effusion, no deformity and no erythema. Tenderness found. Medial joint line and lateral joint line tenderness noted. No patellar tendon tenderness noted.  Skin: No pallor.  Psychiatric: She has a normal mood and affect. Her behavior is normal. Judgment and thought content normal.  Briefly tearful when discussing her weight gain; frustration with her obesity      Assessment & Plan:   Problem List Items Addressed This Visit       Other   Pain in right knee    Refer to ortho for evaluation; she has had total left knee replacement, likely has significant OA in the right knee as well from her morbid obesity; with her level of pain and subjective symptoms, she may also have internal derangement with ligamentous injury or meniscal injury; ice; since she is on benzo, I am not comfortable giving her narcotic for her pain; she understands; will treat with NSAID Rx; do not use other OTC NSAIDs; I initially sent 500 mg BID Rx to pharmacy, but decided to use the 375 mg BID dosing instead and printed that Rx for patient, handed it to her, and circled the instructions to pharmacy to cancel the 500 mg Rx      Relevant Orders   Ambulatory referral to Orthopedic Surgery   Morbid obesity ([place])    Patient is understandably upset with her obesity and inability to lose weight; support given; she is unable to exercise for any considerable time because of her knees; healthy eating encouraged, little by little          Follow up plan: No Follow-up on file.  An after-visit summary was printed and given to the patient at [facility].  Please see the patient instructions which may contain other information and recommendations beyond what is mentioned above in the assessment and plan.  Meds ordered this encounter  Medications  . DISCONTD: naproxen (NAPROSYN) 500 MG tablet    Sig: Take 1 tablet (500 mg total) by mouth 2 (two) times daily with a meal. If needed for pain; do not take with any other non-steroidals    Dispense:  60 tablet    Refill:  0  . naproxen (NAPROSYN) 375 MG tablet    Sig: Take 1 tablet (375 mg total) by mouth 2 (two) times daily with a meal. If needed; take 81 mg coated aspirin daily one hour PRIOR to morning naproxen    Dispense:  60 tablet    Refill:  2    CANCEL the 500 mg strength I just sent    Orders Placed This Encounter  Procedures  . Ambulatory referral to Orthopedic Surgery

## 2016-01-06 NOTE — Assessment & Plan Note (Signed)
Patient is understandably upset with her obesity and inability to lose weight; support given; she is unable to exercise for any considerable time because of her knees; healthy eating encouraged, little by little

## 2016-01-09 ENCOUNTER — Encounter: Payer: Self-pay | Admitting: Psychiatry

## 2016-01-09 ENCOUNTER — Telehealth: Payer: Self-pay

## 2016-01-09 ENCOUNTER — Ambulatory Visit (INDEPENDENT_AMBULATORY_CARE_PROVIDER_SITE_OTHER): Payer: Medicare HMO | Admitting: Psychiatry

## 2016-01-09 VITALS — BP 116/74 | HR 88 | Ht 61.5 in | Wt 274.8 lb

## 2016-01-09 DIAGNOSIS — F4310 Post-traumatic stress disorder, unspecified: Secondary | ICD-10-CM | POA: Diagnosis not present

## 2016-01-09 DIAGNOSIS — F3130 Bipolar disorder, current episode depressed, mild or moderate severity, unspecified: Secondary | ICD-10-CM

## 2016-01-09 MED ORDER — DIVALPROEX SODIUM 250 MG PO DR TAB: 250.0000 mg | DELAYED_RELEASE_TABLET | Freq: Two times a day (BID) | ORAL | 1 refills | Status: DC

## 2016-01-09 MED ORDER — ALPRAZOLAM 0.5 MG PO TABS: 0.5000 mg | ORAL_TABLET | Freq: Two times a day (BID) | ORAL | 1 refills | Status: DC | PRN

## 2016-01-09 MED ORDER — QUETIAPINE FUMARATE ER 300 MG PO TB24: 300.0000 mg | ORAL_TABLET | Freq: Every day | ORAL | 1 refills | Status: DC

## 2016-01-09 MED ORDER — ESCITALOPRAM OXALATE 20 MG PO TABS: 20.0000 mg | ORAL_TABLET | Freq: Every day | ORAL | 2 refills | Status: DC

## 2016-01-09 NOTE — Telephone Encounter (Signed)
Patient was not happy but notified

## 2016-01-09 NOTE — Telephone Encounter (Signed)
I prescribed naproxen for her I'm not able to prescribe any narcotics for her because she uses a benzodiazepine Narcotics plus a benzo can cause serious risk and potential death from accidental overdose, so I can't use anything stronger than naproxen for this She can use ice, heat, topical rubs on the knee

## 2016-01-09 NOTE — Progress Notes (Signed)
Patient ID: Nicole Austin, female   DOB: Jul 09, 1960, 55 y.o.   MRN: [id number] [facility] MD/PA/NP OP Progress Note  01/09/2016 1:36 PM Delrae Himmelman  MRN:  [id number]  Subjective:  Patient is a [name] with history of bipolar disorder and posttraumatic stress disorder who was seen for follow-up. She reported that she is currently living in the [place] housing and is trying to get herself established. She reported that she feels that her medications are not helping and was focused on getting a high dose of the Xanax. She reported that she has been taking 3 pills daily. She was upset and agitated. She reported that she wants a higher dose. She sleeps most of the time. She reported that she has been feeling sad and does not enjoy the holidays as she remembers her mother who passed away last year in 11/27/22. She has not started seeing a therapist yet. We discussed about starting her therapy on the regular basis and she agreed with the plan. She currently denied having any suicidal homicidal ideations or plans.   She reported that other medications do not help and she will stop taking the other medications except for the Xanax. I encouraged her to start talking to the therapist and she agreed with the plan. She most recent understanding. She reported that she is going to spend the rest of the day with her sister who brought her for the appointment.      Chief Complaint:  Chief Complaint    Follow-up     Visit Diagnosis:     ICD-9-CM ICD-10-CM   1. Bipolar I disorder, most recent episode depressed ([place]) 296.50 F31.30   2. PTSD (post-traumatic stress disorder) 309.81 F43.10     Past Medical History:  Past Medical History:  Diagnosis Date  . ADHD (attention deficit hyperactivity disorder)   . Arthritis   . Asthma   . Bipolar 1 disorder ([place])   . COPD (chronic obstructive pulmonary disease) ([place])   . Depression   . GERD (gastroesophageal reflux disease) 08/30/2015  . Low  HDL (under 40) 08/30/2015  . MI (myocardial infarction)   . PTSD (post-traumatic stress disorder)   . Rhabdomyolysis   . Stroke [facility])     Past Surgical History:  Procedure Laterality Date  . REPLACEMENT TOTAL KNEE Left    Family History:  Family History  Problem Relation Age of Onset  . Hernia Mother   . Heart disease Mother   . OCD Mother   . Diabetes Mother   . Parkinson's disease Father   . Bipolar disorder Sister   . Schizophrenia Sister   . ADD / ADHD Sister   . Alcohol abuse Brother   . Bipolar disorder Sister   . Paranoid behavior Sister   . ADD / ADHD Sister    Social History:  Social History   Social History  . Marital status: Single    Spouse name: N/A  . Number of children: N/A  . Years of education: N/A   Social History Main Topics  . Smoking status: Current Every Day Smoker    Packs/day: 1.00    Years: 36.00    Types: Cigarettes    Start date: 10/11/1984  . Smokeless tobacco: Never Used  . Alcohol use No  . Drug use: No  . Sexual activity: No   Other Topics Concern  . None   Social History Narrative  . None   Additional History:   Assessment:   Musculoskeletal: Strength &  Muscle Tone: within normal limits Gait & Station: limping Patient leans: N/A  Psychiatric Specialty Exam: Anxiety  Symptoms include insomnia and nervous/anxious behavior. Patient reports no suicidal ideas.    Depression         Associated symptoms include insomnia.  Associated symptoms include no suicidal ideas.  Past medical history includes anxiety.   Insomnia  PMH includes: depression.    Review of Systems  Psychiatric/Behavioral: Positive for depression. Negative for hallucinations, memory loss, substance abuse and suicidal ideas. The patient is nervous/anxious and has insomnia.     Blood pressure 116/74, pulse 88, height 5' 1.5" (1.562 m), weight 274 lb 12.8 oz (124.6 kg).Body mass index is 51.08 kg/m.  General Appearance:fair   Eye Contact:  Good  Speech:   Clear and Coherent and Normal Rate  Volume:  Normal  Mood:  Anxious and Depressed  Affect:  Constricted   Thought Process:  Linear  Orientation:  Full (Time, Place, and Person)  Thought Content:  Negative  Suicidal Thoughts:  No  Homicidal Thoughts:  No  Memory:  Immediate;   Good Recent;   Good Remote;   Good  Judgement:  Good  Insight:  Good  Psychomotor Activity:  Negative  Concentration:  Good  Recall:  Good  Fund of Knowledge: Good  Language: Good  Akathisia:  Negative  Handed:  Right unknown  AIMS (if indicated):  Not done  Assets:  Communication Skills Housing Social Support  ADL's:  Intact  Cognition: WNL  Sleep:  Good   Is the patient at risk to self?  No. Has the patient been a risk to self in the past 6 months?  No. Has the patient been a risk to self within the distant past?  Yes.   Is the patient a risk to others?  No. Has the patient been a risk to others in the past 6 months?  No. Has the patient been a risk to others within the distant past?  No.  Current Medications: Current Outpatient Prescriptions  Medication Sig Dispense Refill  . albuterol (PROVENTIL HFA;VENTOLIN HFA) 108 (90 Base) MCG/ACT inhaler Inhale 2 puffs into the lungs every 6 (six) hours as needed for wheezing or shortness of breath. 1 Inhaler 0  . albuterol (PROVENTIL) (2.5 MG/3ML) 0.083% nebulizer solution Take 3 mLs (2.5 mg total) by nebulization every 4 (four) hours as needed for wheezing or shortness of breath. 75 mL 0  . ALPRAZolam (XANAX) 0.5 MG tablet Take 1 tablet (0.5 mg total) by mouth 2 (two) times daily as needed for anxiety. 60 tablet 1  . aspirin EC 81 MG tablet Take 81 mg by mouth daily.    Marland Kitchen atorvastatin (LIPITOR) 40 MG tablet Take 1 tablet (40 mg total) by mouth at bedtime. 30 tablet 3  . divalproex (DEPAKOTE) 250 MG DR tablet Take 1 tablet (250 mg total) by mouth 2 (two) times daily. 60 tablet 1  . escitalopram (LEXAPRO) 20 MG tablet Take 1 tablet (20 mg total) by mouth  daily. 30 tablet 2  . furosemide (LASIX) 20 MG tablet Take 1 tablet (20 mg total) by mouth daily. (after finishing the BID prescription) 30 tablet 0  . lidocaine (XYLOCAINE) 5 % ointment Apply 1 application topically as needed for mild pain.    Marland Kitchen LYRICA 200 MG capsule Take 1 capsule (200 mg total) by mouth 2 (two) times daily. 60 capsule 2  . Multiple Vitamin (MULTIVITAMIN WITH MINERALS) TABS tablet Take 1 tablet by mouth daily.    Marland Kitchen  naproxen (NAPROSYN) 375 MG tablet Take 1 tablet (375 mg total) by mouth 2 (two) times daily with a meal. If needed; take 81 mg coated aspirin daily one hour PRIOR to morning naproxen 60 tablet 2  . potassium chloride (KLOR-CON 10) 10 MEQ tablet One by mouth daily 30 tablet 2  . QUEtiapine (SEROQUEL XR) 300 MG 24 hr tablet Take 1 tablet (300 mg total) by mouth at bedtime. 30 tablet 1  . ranitidine (ZANTAC) 150 MG tablet Take 1 tablet (150 mg total) by mouth 2 (two) times daily. 60 tablet 2  . Tiotropium Bromide Monohydrate (SPIRIVA RESPIMAT) 2.5 MCG/ACT AERS Inhale 2 puffs into the lungs daily. 4 g 11   No current facility-administered medications for this visit.     Medical Decision Making:  Established Problem, Stable/Improving (1) and Review or order clinical lab tests (1)  Treatment Plan Summary:Medication management    Bipolar disorder-continue Seroquel XR at 300 mg at bedtime.   Discussed with patient about her Xanax dose  Continue  Xanax 0.5 mg  twice a day    Continue her Lexapro 20 mg a day.  She is on Depakote 250 mg twice a day   PTSD-stable. Will continue the Lexapro and Seroquel.  Patient will follow up in 2 weeks  or earlier depending on her symptoms    More than 50% of the time spent in psychoeducation, counseling and coordination of care.    This note was generated in part or whole with voice recognition software. Voice regonition is usually quite accurate but there are transcription errors that can and very often do occur. I apologize  for any typographical errors that were not detected and corrected.    Rainey Pines, MD  01/09/2016, 1:36 PM

## 2016-01-09 NOTE — Telephone Encounter (Signed)
Patient called states her right leg is still hurting very badly wants to see if you can give her something for pain?

## 2016-01-10 ENCOUNTER — Encounter: Payer: Self-pay | Admitting: *Deleted

## 2016-01-10 ENCOUNTER — Other Ambulatory Visit: Payer: Self-pay | Admitting: *Deleted

## 2016-01-10 NOTE — Patient Outreach (Signed)
[facility]) Care Management  01/10/2016  Nicole Austin 02-19-1961 [id number]   Phone call to patient to inform her that the completed request form for  a Independent Assessment for [place] has been set to [facility].   Per patient, she states that she is having issues with [employer] stating that they are trying to evict her due to hanging wires from a smoke detector that remained undone.  Patient states that they were also trying to evict her over non-payment of rent. Her next court date on February 28, 2016.  Patient states that she has a Chief Executive Officer with [employer] assisting her with this as  Well as to address additional repairs needed in the phone.   Plan:  This social worker will follow up with patient regarding the independent assessment on  01/16/16.    [name] [facility] Care Management [phone]

## 2016-01-10 NOTE — Patient Outreach (Signed)
[place] [name]) Care Management   01/10/2016  Nicole Austin Hair 1960/04/21 [id number]  Nicole Austin is an 55 y.o. female  Subjective:  Pt reports COPD doing better since using Spiriva, has not had to use  Rescue inhaler.  Pt reports she needs a new nebulizer machine.   Pt reports having  Right knee pain, recently saw Primary Care MD, MD referred her to Orthopedic MD. Pt reports only taking Naproxen for pain, pain currently +10.  Pt reports to register  For bariatric surgery tomorrow, looking forward to weight loss.      Objective:   Vitals:   01/10/16 1338  BP: 112/70  Pulse: 65  Resp: 16    ROS  Physical Exam  Constitutional: She is oriented to person, place, and time. She appears well-developed and well-nourished.  Overweight   Cardiovascular: Normal rate, regular rhythm and normal heart sounds.   Respiratory: Effort normal. She has wheezes.  Wheezing noted on inspiration in posterior left upper lobe.   GI: Soft. Bowel sounds are normal.  Musculoskeletal: She exhibits edema.  +1 edema right lower leg/ankle.   Right knee pain-  Impairs walking.   Neurological: She is alert and oriented to person, place, and time.  Skin: Skin is warm and dry.  Psychiatric: She has a normal mood and affect. Her behavior is normal. Judgment and thought content normal.    Encounter Medications:   Outpatient Encounter Prescriptions as of 01/10/2016  Medication Sig Note  . albuterol (PROVENTIL) (2.5 MG/3ML) 0.083% nebulizer solution Take 3 mLs (2.5 mg total) by nebulization every 4 (four) hours as needed for wheezing or shortness of breath.   . ALPRAZolam (XANAX) 0.5 MG tablet Take 1 tablet (0.5 mg total) by mouth 2 (two) times daily as needed for anxiety.   Marland Kitchen aspirin EC 81 MG tablet Take 81 mg by mouth daily.   Marland Kitchen atorvastatin (LIPITOR) 40 MG tablet Take 1 tablet (40 mg total) by mouth at bedtime.   . divalproex (DEPAKOTE) 250 MG DR tablet Take 1 tablet (250 mg total)  by mouth 2 (two) times daily.   Marland Kitchen escitalopram (LEXAPRO) 20 MG tablet Take 1 tablet (20 mg total) by mouth daily. 01/10/2016: Per pt, taking twice daily   . furosemide (LASIX) 20 MG tablet Take 1 tablet (20 mg total) by mouth daily. (after finishing the BID prescription)   . lidocaine (XYLOCAINE) 5 % ointment Apply 1 application topically as needed for mild pain.   Marland Kitchen LYRICA 200 MG capsule Take 1 capsule (200 mg total) by mouth 2 (two) times daily.   . Multiple Vitamin (MULTIVITAMIN WITH MINERALS) TABS tablet Take 1 tablet by mouth daily.   . naproxen (NAPROSYN) 500 MG tablet Take 500 mg by mouth 2 (two) times daily with a meal. 01/10/2016: As needed.   . potassium chloride (KLOR-CON 10) 10 MEQ tablet One by mouth daily   . QUEtiapine (SEROQUEL XR) 300 MG 24 hr tablet Take 1 tablet (300 mg total) by mouth at bedtime.   . ranitidine (ZANTAC) 150 MG tablet Take 1 tablet (150 mg total) by mouth 2 (two) times daily.   . Tiotropium Bromide Monohydrate (SPIRIVA RESPIMAT) 2.5 MCG/ACT AERS Inhale 2 puffs into the lungs daily.   Marland Kitchen tiZANidine (ZANAFLEX) 4 MG tablet Take 4 mg by mouth every 8 (eight) hours as needed. Take 4 mg every 8 hours as needed. 01/10/2016: As needed.   Marland Kitchen albuterol (PROVENTIL HFA;VENTOLIN HFA) 108 (90 Base) MCG/ACT inhaler Inhale 2 puffs into  the lungs every 6 (six) hours as needed for wheezing or shortness of breath. (Patient not taking: Reported on 01/10/2016)   . naproxen (NAPROSYN) 375 MG tablet Take 1 tablet (375 mg total) by mouth 2 (two) times daily with a meal. If needed; take 81 mg coated aspirin daily one hour PRIOR to morning naproxen (Patient not taking: Reported on 01/10/2016)    No facility-administered encounter medications on file as of 01/10/2016.     Functional Status:   In your present state of health, do you have any difficulty performing the following activities: 01/10/2016 01/05/2016  Hearing? N N  Vision? Y Y  Difficulty concentrating or making decisions? N N   Walking or climbing stairs? Y Y  Dressing or bathing? Y Y  Doing errands, shopping? Nicole Austin  Preparing Food and eating ? N -  Using the Toilet? N -  In the past six months, have you accidently leaked urine? N -  Do you have problems with loss of bowel control? N -  Managing your Medications? N -  Managing your Finances? N -  Housekeeping or managing your Housekeeping? N -  Some recent data might be hidden    Fall/Depression Screening:    PHQ 2/9 Scores 01/10/2016 01/05/2016 12/26/2015 12/13/2015 11/25/2015 08/29/2015 06/21/2015  PHQ - 2 Score 2 1 1  0 0 0 2  PHQ- 9 Score 12 - - - - - 7  Exception Documentation - - - - - - -    Assessment:  Pleasant 55 year old woman, lives alone, sister close by very supportive.                          COPD:  Wheezing noted on inspiration in posterior left upper lobe.  O 2 sat                              At rest 93%.  Discussed with pt use of purse lip breathing, COPD zones magnet                              Provided/reveiwed.                           Obesity:  Per pt to register for Bariatric surgery tomorrow.                          Chronic pain:  Right knee pain, +10 today, taking Naprosyn 500 mg bid.                                                 Per pt, referral for Orthopedic arranged by Primary Care MD.                  Plan:  As discussed, pt to call MD office and request prescription for new nebulizer              Machine be sent to medical equipment company.               Plan to continue to provide pt with ongoing community nurse case management services,  Follow up again next month- home visit.             Barrier letter sent to Dr. Sanda Austin, also to send MD  11/14 home visit encounter.   THN CM Care Plan Problem One   Flowsheet Row Most Recent Value  Care Plan Problem One  COPD- improvement in self management   Role Documenting the Problem One  Care Management Coordinator  Care Plan for Problem One  Active  THN Long Term Goal  (31-90 days)  Pt would have a better understanding of self management of COPD   THN Long Term Goal Start Date  01/10/16  Interventions for Problem One Long Term Goal  initial home visit done- COPD zones magnet provided.   THN CM Short Term Goal #1 (0-30 days)  Pt would have better understanding of yellow zones, when to call MD within the next 30 days   THN CM Short Term Goal #1 Start Date  01/10/16  Interventions for Short Term Goal #1  Reviewed with pt yellow zones of COPD, when to call MD      Zara Chess.   [place] Care Management  [phone]

## 2016-01-10 NOTE — Patient Outreach (Addendum)
[place] [facility]) Care Management  01/10/2016  Nicole Austin 1960-10-04 [id number]   Late Entry-completed request form for an independent assessment for personal care services received from patient's providers office, however missing elements identified and needed to be added for  processing.  Request form taken to the provider office on 01/09/16, missing elements provided and will be faxed to [facility] for processing.  [name] [facility] Care Management [phone]

## 2016-01-16 ENCOUNTER — Ambulatory Visit: Payer: Self-pay | Admitting: *Deleted

## 2016-01-16 ENCOUNTER — Ambulatory Visit: Payer: Commercial Managed Care - HMO | Admitting: *Deleted

## 2016-01-17 ENCOUNTER — Ambulatory Visit: Payer: Self-pay | Admitting: Family Medicine

## 2016-01-17 ENCOUNTER — Other Ambulatory Visit: Payer: Self-pay | Admitting: *Deleted

## 2016-01-17 NOTE — Patient Outreach (Signed)
[place] [facility]) Care Management  01/17/2016  Nicole Austin 17-Aug-1960 [id number]   Phone call to patient to follow up on the scheduling of her independent assessment for personal care services. HIPPA compliant voicemail message let requesting a return call.    [name] [facility] Care Management [phone]

## 2016-01-17 NOTE — Patient Outreach (Signed)
[place] [facility]) Care Management  01/17/2016  Nicole Austin [birth year] [id number]   Phone call to patient to confirm that the independent assessment for personal care services has been received. by [facility]. They will contact patient to schedule a in home assessment. Patient continues to have issues with [facility] and is actively working with [employer] cabin crew for assistance.  Next court date 02/28/15.  Patient plans to spend the Thanksgiving Holiday with her sister for support.   Plan:  This social worker will call patient next week to follow up on referral for personal care services.   [name] [facility] Care Management [phone]

## 2016-01-23 ENCOUNTER — Ambulatory Visit: Payer: Self-pay | Admitting: *Deleted

## 2016-01-23 ENCOUNTER — Other Ambulatory Visit: Payer: Self-pay | Admitting: Family Medicine

## 2016-01-23 ENCOUNTER — Other Ambulatory Visit: Payer: Self-pay | Admitting: *Deleted

## 2016-01-23 DIAGNOSIS — Z5181 Encounter for therapeutic drug level monitoring: Secondary | ICD-10-CM

## 2016-01-23 NOTE — Patient Outreach (Signed)
[place] [facility]) Care Management  01/23/2016  Nicole Austin May 27, 1960 [id number]   Phone call to patient to follow up on request for a independent assessment for personal care services.  HIPPA compliant voicemail message left requesting a return call.   [name] [facility] Care Management [phone]

## 2016-01-24 ENCOUNTER — Other Ambulatory Visit: Payer: Self-pay | Admitting: Family Medicine

## 2016-01-24 ENCOUNTER — Ambulatory Visit: Payer: Medicare HMO | Admitting: Licensed Clinical Social Worker

## 2016-01-24 ENCOUNTER — Ambulatory Visit: Payer: Medicare HMO | Admitting: Psychiatry

## 2016-01-24 ENCOUNTER — Telehealth: Payer: Self-pay

## 2016-01-24 DIAGNOSIS — M62838 Other muscle spasm: Secondary | ICD-10-CM

## 2016-01-24 NOTE — Telephone Encounter (Signed)
Patient called states got referral to ortho but cant go due to a balance she owes and cant afford.  Also says the naproxen is not helping, please advise?

## 2016-01-25 ENCOUNTER — Other Ambulatory Visit: Payer: Self-pay

## 2016-01-25 ENCOUNTER — Other Ambulatory Visit: Payer: Self-pay | Admitting: *Deleted

## 2016-01-25 ENCOUNTER — Other Ambulatory Visit: Payer: Self-pay | Admitting: Psychiatry

## 2016-01-25 DIAGNOSIS — Z5181 Encounter for therapeutic drug level monitoring: Secondary | ICD-10-CM

## 2016-01-25 NOTE — Telephone Encounter (Signed)
I refilled patient's furosemide Please ask if she'll please stop by this week or early next for a lab check to monitor her electrolytes and kidneys on the medicine (I ordered labs already) Thank you

## 2016-01-25 NOTE — Patient Outreach (Signed)
I called Ms. Branscome to discuss smoking cessation and her medications. I had to leave a HIPPA compliant message for the patient to call me back.  If I do not hear back from the patient today, I will call the patient back at a later date.  This was my first attempt to reach her.   Deanne Coffer, PharmD, [employer] [phone]

## 2016-01-25 NOTE — Assessment & Plan Note (Signed)
Check Mg2+ and K+ and creatinine on loop diuretic

## 2016-01-25 NOTE — Patient Outreach (Signed)
[name]) Care Management  01/25/2016  Nicole Austin 10-03-1960 [id number]   Late Entry-Phone call to patient on 01/24/16 to follow up on the request for an independent assessment for personal care services through [facility]. Per patient, [facility] has contacted her and she is scheduled for a independent assessment on 01/27/16 at 8:30am.   Plan:  This Education officer, museum will follow up with patient regarding the results of that in home assessment within 2 weeks   [place], [place] Management [phone]

## 2016-01-25 NOTE — Telephone Encounter (Signed)
Patient notified

## 2016-01-26 ENCOUNTER — Other Ambulatory Visit: Payer: Self-pay | Admitting: *Deleted

## 2016-01-26 ENCOUNTER — Telehealth: Payer: Self-pay | Admitting: Family Medicine

## 2016-01-26 MED ORDER — VARENICLINE TARTRATE 1 MG PO TABS: 1.0000 mg | ORAL_TABLET | Freq: Two times a day (BID) | ORAL | 2 refills | Status: DC

## 2016-01-26 MED ORDER — NEBULIZER COMPRESSOR KIT: PACK | 0 refills | Status: DC

## 2016-01-26 NOTE — Telephone Encounter (Signed)
Can you write new rx?

## 2016-01-26 NOTE — Telephone Encounter (Signed)
Pt states she needs a new nebulizer machine. She is not sure who to contact about this and would like a return call.

## 2016-01-26 NOTE — Telephone Encounter (Signed)
Cyclobenzaprine denied; she has zanaflex on med list Chantix continuing month rx approved

## 2016-01-26 NOTE — Patient Outreach (Signed)
[place] [facility]) Care Management  01/26/2016  Kenesha Kuechle Duffin 1961/02/24 [id number]   Phone call from patient stating need for a new nebulizer machine.  This Education officer, museum suggested that she contact her provider's office to request this.  Per patient, she has already placed a call with them and is waiting on a return call.  This social worker was able to confirm that patient had contacted her doctor and they were working on getting the prescription. This Education officer, museum will follow up with patient regarding the nebulizer within 1 week.   [name] [facility] Care Management [phone]

## 2016-01-26 NOTE — Telephone Encounter (Signed)
rx sent

## 2016-01-27 ENCOUNTER — Other Ambulatory Visit: Payer: Self-pay

## 2016-01-27 MED ORDER — ETODOLAC 200 MG PO CAPS: 200.0000 mg | ORAL_CAPSULE | Freq: Three times a day (TID) | ORAL | 1 refills | Status: DC

## 2016-01-27 NOTE — Telephone Encounter (Signed)
I called, left detailed msg If she can't go to that particular ortho, just call my staff on Monday and they can find out who that is and get her to another ortho; I don't need to be involved with that I will be happy to prescribe a different medicine STOP the naproxen and I'll send in new Rx for pain; do not take them both Chart reviewed; she has already used meloxicam; I'll send in different Rx

## 2016-01-27 NOTE — Patient Outreach (Signed)
I  called Ms. Twist to discuss smoking cessation and her medications. I had to leave a HIPPA compliant message for the patient to call me back.  If I do not hear back from the patient today, I will call the patient back at a later date.  This was my second attempt to reach her.   Deanne Coffer, PharmD, [employer] [phone]

## 2016-02-02 ENCOUNTER — Other Ambulatory Visit: Payer: Self-pay | Admitting: *Deleted

## 2016-02-02 NOTE — Patient Outreach (Signed)
[place] [facility]) Care Management  02/02/2016  Nicole Austin 03/11/1960 [id number]   Phone call to patient to follow up on Independent assessment for personal care services. Per patient, the assessment was completed today and went well. Per patient, she has not received a new nebulizer machine yet and has to received any calls regarding it yet.   Plan: This Education officer, museum will contact providers office to follow up on nebulizer machine.    [name] [facility] Care Management [phone]

## 2016-02-06 ENCOUNTER — Telehealth: Payer: Self-pay

## 2016-02-06 NOTE — Telephone Encounter (Signed)
Yes, instruct her to stop the Chantix Remove from med list and add to adverse reactions please Encourage her to call 1-800-QUIT-NOW for assistance

## 2016-02-06 NOTE — Telephone Encounter (Signed)
Patient states having a lot of nausea, wants to see if it could be a side effect or coming from the chantix?

## 2016-02-07 ENCOUNTER — Other Ambulatory Visit: Payer: Self-pay

## 2016-02-07 NOTE — Telephone Encounter (Signed)
Left detailed voicemail

## 2016-02-10 ENCOUNTER — Other Ambulatory Visit: Payer: Self-pay | Admitting: *Deleted

## 2016-02-10 ENCOUNTER — Telehealth: Payer: Self-pay

## 2016-02-10 MED ORDER — ALBUTEROL SULFATE (2.5 MG/3ML) 0.083% IN NEBU: 2.5000 mg | INHALATION_SOLUTION | RESPIRATORY_TRACT | 0 refills | Status: DC | PRN

## 2016-02-10 MED ORDER — NEBULIZER COMPRESSOR KIT: PACK | 0 refills | Status: DC

## 2016-02-10 NOTE — Patient Outreach (Signed)
[place] [facility]) Care Management   02/10/2016  Nicole Austin December 25, 1960 [id number]  Nicole Austin is an 55 y.o. female  Subjective: Pt reports they took her off Chantix, it was making her violently sick, was on the  Medication for 30 days, ate to get rid of the nausea which is not good for weight loss.  Pt reports She did still have nausea after stopping the Chantix but noticed today it was after having a  Cigarette.   Pt reports in order to qualify for bariatric surgery, has to stop smoking, patch did  Not work in the past.   Pt reports she did  receive nebulizer machine yet, did call MD office and Request it.     Objective:   Vitals:   02/10/16 1408  BP: 90/68  Pulse: 75  Resp: 20    ROS  Physical Exam  Constitutional: She is oriented to person, place, and time. She appears well-developed and well-nourished.  Cardiovascular: Normal rate, regular rhythm and normal heart sounds.   Respiratory: Effort normal.  Wheezing noted on expiration- posterior  bilateral upper lobes.   GI: Soft.  Musculoskeletal: Normal range of motion.  Neurological: She is alert and oriented to person, place, and time.  Skin: Skin is warm and dry.  Psychiatric: She has a normal mood and affect. Her behavior is normal. Judgment and thought content normal.    Encounter Medications:   Outpatient Encounter Prescriptions as of 02/10/2016  Medication Sig Note  . albuterol (PROVENTIL HFA;VENTOLIN HFA) 108 (90 Base) MCG/ACT inhaler Inhale 2 puffs into the lungs every 6 (six) hours as needed for wheezing or shortness of breath. 02/10/2016: As needed.   . ALPRAZolam (XANAX) 0.5 MG tablet Take 1 tablet (0.5 mg total) by mouth 2 (two) times daily as needed for anxiety. 02/10/2016: As needed.   Marland Kitchen aspirin EC 81 MG tablet Take 81 mg by mouth daily.   Marland Kitchen atorvastatin (LIPITOR) 40 MG tablet Take 1 tablet (40 mg total) by mouth at bedtime.   . divalproex (DEPAKOTE) 250 MG DR tablet Take 1  tablet (250 mg total) by mouth 2 (two) times daily.   Marland Kitchen escitalopram (LEXAPRO) 20 MG tablet Take 1 tablet (20 mg total) by mouth daily. 01/10/2016: Per pt, taking twice daily   . etodolac (LODINE) 200 MG capsule Take 1 capsule (200 mg total) by mouth every 8 (eight) hours. PRN pain; take with food; do NOT take w/other NSAIDs 02/10/2016: As needed.   . furosemide (LASIX) 20 MG tablet Take 1 tablet (20 mg total) by mouth daily.   Marland Kitchen lidocaine (XYLOCAINE) 5 % ointment Apply 1 application topically as needed for mild pain. 02/10/2016: As needed.   Marland Kitchen LYRICA 200 MG capsule Take 1 capsule (200 mg total) by mouth 2 (two) times daily.   . Multiple Vitamin (MULTIVITAMIN WITH MINERALS) TABS tablet Take 1 tablet by mouth daily.   . potassium chloride (KLOR-CON 10) 10 MEQ tablet One by mouth daily   . QUEtiapine (SEROQUEL XR) 300 MG 24 hr tablet Take 1 tablet (300 mg total) by mouth at bedtime.   . ranitidine (ZANTAC) 150 MG tablet Take 1 tablet (150 mg total) by mouth 2 (two) times daily.   . Tiotropium Bromide Monohydrate (SPIRIVA RESPIMAT) 2.5 MCG/ACT AERS Inhale 2 puffs into the lungs daily.   Marland Kitchen tiZANidine (ZANAFLEX) 4 MG tablet Take 4 mg by mouth every 8 (eight) hours as needed. Take 4 mg every 8 hours as needed. 02/10/2016: As needed.   Marland Kitchen  albuterol (PROVENTIL) (2.5 MG/3ML) 0.083% nebulizer solution Take 3 mLs (2.5 mg total) by nebulization every 4 (four) hours as needed for wheezing or shortness of breath. (Patient not taking: Reported on 02/10/2016)   . Respiratory Therapy Supplies (NEBULIZER COMPRESSOR) KIT One nebulizer kit/machine with tubing; for use with nebulized albuterol (Patient not taking: Reported on 02/10/2016)    No facility-administered encounter medications on file as of 02/10/2016.     Functional Status:   In your present state of health, do you have any difficulty performing the following activities: 01/10/2016 01/05/2016  Hearing? N N  Vision? Y Y  Difficulty concentrating or making  decisions? N N  Walking or climbing stairs? Y Y  Dressing or bathing? Y Y  Doing errands, shopping? [name]  Preparing Food and eating ? N -  Using the Toilet? N -  In the past six months, have you accidently leaked urine? N -  Do you have problems with loss of bowel control? N -  Managing your Medications? N -  Managing your Finances? N -  Housekeeping or managing your Housekeeping? N -  Some recent data might be hidden    Fall/Depression Screening:    PHQ 2/9 Scores 01/10/2016 01/05/2016 12/26/2015 12/13/2015 11/25/2015 08/29/2015 06/21/2015  PHQ - 2 Score 2 1 1  0 0 0 2  PHQ- 9 Score 12 - - - - - 7  Exception Documentation - - - - - - -    Assessment:  Pleasant 55 year old woman,resides alone.  No c/o pain today                          COPD:  Wheezing noted on expiration posterior bilateral upper lobes.                                         Did not receive new nebulizer machine yet.  Reinforced with pt use of purse                                       Lip breathing when sob, rescue inhaler.                          Smoking cessation: pt taking off Chantix- side effect nausea.                                                           Unable to tolerate nicotine patch in the patch,qualified for                                                          Bariatric surgery, need to quit smoking.  Plan:  RN CM called Dr. Delight Ovens office about nebulizer machine order status, was informed order sent To [facility] to which pt requested order be sent to current pharmacy [facility] (carries  Nebulizer machines)- MD to be notified to send order to [employer].              As discussed, pt  to call  Quit smoking- get help with smoking cessation.            RN CM to continue to provide pt with community nurse case management services, follow up             Again next month - home visit.   [facility] CM Care Plan Problem One    Flowsheet Row Most Recent Value  Care Plan Problem One  COPD- improvement in self improvement   Role Documenting the Problem One  Care Management Coordinator  Care Plan for Problem One  Active  THN Long Term Goal (31-90 days)  Pt would have better understanding of COPD in the next 60 days   THN Long Term Goal Start Date  01/10/16  Interventions for Problem One Long Term Goal  Reviewed with pt use of purse lip breathing when sob.   THN CM Short Term Goal #1 (0-30 days)  Pt will have better understanding of yellow zones, when to call MD within next 30 days   THN CM Short Term Goal #1 Start Date  01/10/16  [facility] CM Short Term Goal #1 Met Date  02/10/16 [passed time frame.  ]    [facility] CM Care Plan Problem Two   Flowsheet Row Most Recent Value  Care Plan Problem Two  Smoking cessation   Role Documenting the Problem Two  Care Management Coordinator  Care Plan for Problem Two  Active  THN CM Short Term Goal #1 (0-30 days)  Pt would follow up with Quit now within the next 30 days   THN CM Short Term Goal #1 Start Date  02/10/16  Interventions for Short Term Goal #2   Provided pt with information on smoking cessation, help line to call, meds to use to help with quitting.   THN CM Short Term Goal #2 (0-30 days)  Pt would follow up with Dr. Sanda Klein within the next 30 days- inquire about other smoking cessation medications   THN CM Short Term Goal #2 Start Date  02/10/16  Interventions for Short Term Goal #2  Discussed with pt other smoking cessation medications to help with smoking cessation.      Zara Chess.   [place] Care Management  [phone]

## 2016-02-10 NOTE — Telephone Encounter (Signed)
Please refax nebulizer to [facility]

## 2016-02-10 NOTE — Telephone Encounter (Signed)
Faxed kit and med to [facility]

## 2016-02-15 ENCOUNTER — Ambulatory Visit: Payer: Medicare HMO | Admitting: Licensed Clinical Social Worker

## 2016-02-15 ENCOUNTER — Ambulatory Visit: Payer: Medicare HMO | Admitting: Psychiatry

## 2016-02-17 ENCOUNTER — Other Ambulatory Visit: Payer: Self-pay | Admitting: *Deleted

## 2016-02-17 NOTE — Patient Outreach (Signed)
Phone call successful, follow up with pt about her nebulizer machine.  Spoke with pt, HIPAA/identity verified.  Pt reports she did not receive her nebulizer machine, found out [facility] does not accept her insurance.  Pt reports she is going to call [facility] as that is where she got other medical supply from.   Pt reports she is doing okay with her breathing but needs her machine.   RN CM encouraged pt to call Medical supply store today, MD prescription will still be needed  to which pt said she would call.   Pt reports he has not follow up with Quit Line yet, been busy but it has been on her mind.     Plan:  As discussed with pt, plan to follow up again next month- home visit.    Zara Chess.   [place] Care Management  [phone]

## 2016-02-22 ENCOUNTER — Other Ambulatory Visit: Payer: Self-pay | Admitting: Family Medicine

## 2016-02-22 ENCOUNTER — Other Ambulatory Visit: Payer: Self-pay | Admitting: Psychiatry

## 2016-02-22 DIAGNOSIS — G894 Chronic pain syndrome: Secondary | ICD-10-CM

## 2016-02-23 ENCOUNTER — Telehealth: Payer: Self-pay | Admitting: Family Medicine

## 2016-02-23 ENCOUNTER — Other Ambulatory Visit: Payer: Self-pay | Admitting: *Deleted

## 2016-02-23 NOTE — Telephone Encounter (Signed)
Chart review shows that she has moderate COPD and uses the nebulizer machine to deliver her SABA  ------------------------------------  For sore throat, I'll recommend chloraseptic spray which is available over-the-counter; I would need to see her for a visit if she is thinking that she might need an antibiotic or something else; I can't diagnose her throat issue over the phone; thank you

## 2016-02-23 NOTE — Patient Outreach (Signed)
Received a call from pt related to nebulizer machine, HIPAA/identity verified by pt.   Pt reports she called [facility] about getting nebulizer machine and was told  paperwork was needed from Primary Care MD.  Pt  Reports she called MD office, relayed paperwork needed  as well as dealing with a sore throat.  Pt reports she was told to use chloraseptic spray, told to make an appointment  but has a cold and  really needs nebulizer machine.  Pt reports being  more sob.  RN CM discussed with pt to follow up with  [facility] again about nebulizer plus  if symptoms get worse to go to ED to which pt voiced understanding.    Plan:  As discussed, pt to follow up with RN CM again  Today if did not receive nebulizer machine.     RN CM to follow up with pt next month- home visit.   Zara Chess.   [place] Care Management  [phone]

## 2016-02-23 NOTE — Telephone Encounter (Signed)
Formation sent to [facility] fax [phone]

## 2016-02-23 NOTE — Telephone Encounter (Signed)
Patient is needing a new nebulizer machine. She contacted [facility] and they told her that we would have to send office notes showing that she is on the machine and why. [facility] can be reached at [phone].  Patient also would like to know if you could please call her something in for sore throat. Please send to [facility]. Have had sore throat for 2 days.

## 2016-02-24 ENCOUNTER — Other Ambulatory Visit: Payer: Self-pay | Admitting: Family Medicine

## 2016-02-24 DIAGNOSIS — J441 Chronic obstructive pulmonary disease with (acute) exacerbation: Secondary | ICD-10-CM | POA: Diagnosis not present

## 2016-02-27 ENCOUNTER — Inpatient Hospital Stay
Admission: EM | Admit: 2016-02-27 | Discharge: 2016-03-01 | DRG: 190 | Disposition: A | Discharge status: Home / Self Care | Payer: Medicare HMO | Attending: Internal Medicine | Admitting: Internal Medicine

## 2016-02-27 ENCOUNTER — Emergency Department: Payer: Medicare HMO

## 2016-02-27 DIAGNOSIS — F329 Major depressive disorder, single episode, unspecified: Secondary | ICD-10-CM | POA: Diagnosis present

## 2016-02-27 DIAGNOSIS — F17210 Nicotine dependence, cigarettes, uncomplicated: Secondary | ICD-10-CM | POA: Diagnosis present

## 2016-02-27 DIAGNOSIS — I252 Old myocardial infarction: Secondary | ICD-10-CM | POA: Diagnosis not present

## 2016-02-27 DIAGNOSIS — Z8673 Personal history of transient ischemic attack (TIA), and cerebral infarction without residual deficits: Secondary | ICD-10-CM | POA: Diagnosis not present

## 2016-02-27 DIAGNOSIS — F319 Bipolar disorder, unspecified: Secondary | ICD-10-CM | POA: Diagnosis not present

## 2016-02-27 DIAGNOSIS — M797 Fibromyalgia: Secondary | ICD-10-CM | POA: Diagnosis present

## 2016-02-27 DIAGNOSIS — I2510 Atherosclerotic heart disease of native coronary artery without angina pectoris: Secondary | ICD-10-CM | POA: Diagnosis present

## 2016-02-27 DIAGNOSIS — Z79899 Other long term (current) drug therapy: Secondary | ICD-10-CM

## 2016-02-27 DIAGNOSIS — Z885 Allergy status to narcotic agent status: Secondary | ICD-10-CM

## 2016-02-27 DIAGNOSIS — I639 Cerebral infarction, unspecified: Secondary | ICD-10-CM

## 2016-02-27 DIAGNOSIS — K219 Gastro-esophageal reflux disease without esophagitis: Secondary | ICD-10-CM | POA: Diagnosis present

## 2016-02-27 DIAGNOSIS — Z6841 Body Mass Index (BMI) 40.0 and over, adult: Secondary | ICD-10-CM

## 2016-02-27 DIAGNOSIS — Z888 Allergy status to other drugs, medicaments and biological substances status: Secondary | ICD-10-CM

## 2016-02-27 DIAGNOSIS — R079 Chest pain, unspecified: Secondary | ICD-10-CM

## 2016-02-27 DIAGNOSIS — J9601 Acute respiratory failure with hypoxia: Secondary | ICD-10-CM | POA: Diagnosis present

## 2016-02-27 DIAGNOSIS — Z7982 Long term (current) use of aspirin: Secondary | ICD-10-CM

## 2016-02-27 DIAGNOSIS — J209 Acute bronchitis, unspecified: Secondary | ICD-10-CM | POA: Diagnosis not present

## 2016-02-27 DIAGNOSIS — F909 Attention-deficit hyperactivity disorder, unspecified type: Secondary | ICD-10-CM | POA: Diagnosis not present

## 2016-02-27 DIAGNOSIS — Z818 Family history of other mental and behavioral disorders: Secondary | ICD-10-CM

## 2016-02-27 DIAGNOSIS — Z833 Family history of diabetes mellitus: Secondary | ICD-10-CM

## 2016-02-27 DIAGNOSIS — Z96652 Presence of left artificial knee joint: Secondary | ICD-10-CM | POA: Diagnosis present

## 2016-02-27 DIAGNOSIS — J440 Chronic obstructive pulmonary disease with acute lower respiratory infection: Secondary | ICD-10-CM | POA: Diagnosis not present

## 2016-02-27 DIAGNOSIS — R0600 Dyspnea, unspecified: Secondary | ICD-10-CM | POA: Diagnosis not present

## 2016-02-27 DIAGNOSIS — M791 Myalgia: Secondary | ICD-10-CM | POA: Diagnosis present

## 2016-02-27 DIAGNOSIS — R0602 Shortness of breath: Secondary | ICD-10-CM | POA: Diagnosis not present

## 2016-02-27 DIAGNOSIS — F4310 Post-traumatic stress disorder, unspecified: Secondary | ICD-10-CM | POA: Diagnosis present

## 2016-02-27 DIAGNOSIS — I10 Essential (primary) hypertension: Secondary | ICD-10-CM | POA: Diagnosis not present

## 2016-02-27 DIAGNOSIS — J441 Chronic obstructive pulmonary disease with (acute) exacerbation: Secondary | ICD-10-CM | POA: Diagnosis not present

## 2016-02-27 DIAGNOSIS — I219 Acute myocardial infarction, unspecified: Secondary | ICD-10-CM

## 2016-02-27 DIAGNOSIS — R05 Cough: Secondary | ICD-10-CM | POA: Diagnosis not present

## 2016-02-27 HISTORY — DX: Acute myocardial infarction, unspecified: I21.9

## 2016-02-27 HISTORY — DX: Cerebral infarction, unspecified: I63.9

## 2016-02-27 LAB — BASIC METABOLIC PANEL
Anion gap: 6 (ref 5–15)
BUN: 8 mg/dL (ref 6–20)
CALCIUM: 8.9 mg/dL (ref 8.9–10.3)
CO2: 33 mmol/L — AB (ref 22–32)
CREATININE: 1.04 mg/dL — AB (ref 0.44–1.00)
Chloride: 100 mmol/L — ABNORMAL LOW (ref 101–111)
GFR calc Af Amer: >60 mL/min (ref >60)
GFR, EST NON AFRICAN AMERICAN: 59 mL/min — AB (ref >60)
GLUCOSE: 114 mg/dL — AB (ref 65–99)
Potassium: 4.1 mmol/L (ref 3.5–5.1)
Sodium: 139 mmol/L (ref 135–145)

## 2016-02-27 LAB — CBC
HEMATOCRIT: 37.2 % (ref 35.0–47.0)
Hemoglobin: 13.1 g/dL (ref 12.0–16.0)
MCH: 31.8 pg (ref 26.0–34.0)
MCHC: 35.1 g/dL (ref 32.0–36.0)
MCV: 90.5 fL (ref 80.0–100.0)
PLATELETS: 163 10*3/uL (ref 150–440)
RBC: 4.11 MIL/uL (ref 3.80–5.20)
RDW: 15.1 % — AB (ref 11.5–14.5)
WBC: 7.5 10*3/uL (ref 3.6–11.0)

## 2016-02-27 LAB — FIBRIN DERIVATIVES D-DIMER (ARMC ONLY): Fibrin derivatives D-dimer (ARMC): 909 — ABNORMAL HIGH (ref 0–499)

## 2016-02-27 LAB — TROPONIN I: Troponin I: <0.03 ng/mL (ref <0.03)

## 2016-02-27 MED ORDER — MORPHINE SULFATE (PF) 2 MG/ML IV SOLN
2.0000 mg | Freq: Once | INTRAVENOUS | Status: AC
  Administered 2016-02-27: 2 mg via INTRAVENOUS
  Filled 2016-02-27: qty 1

## 2016-02-27 MED ORDER — DIVALPROEX SODIUM 250 MG PO DR TAB
250.0000 mg | DELAYED_RELEASE_TABLET | Freq: Two times a day (BID) | ORAL | Status: DC
  Administered 2016-02-27 – 2016-03-01 (×6): 250 mg via ORAL
  Filled 2016-02-27 (×6): qty 1

## 2016-02-27 MED ORDER — ASPIRIN EC 81 MG PO TBEC
81.0000 mg | DELAYED_RELEASE_TABLET | Freq: Every day | ORAL | Status: DC
  Administered 2016-02-28 – 2016-03-01 (×3): 81 mg via ORAL
  Filled 2016-02-27 (×3): qty 1

## 2016-02-27 MED ORDER — IOPAMIDOL (ISOVUE-370) INJECTION 76%
75.0000 mL | Freq: Once | INTRAVENOUS | Status: AC | PRN
  Administered 2016-02-27: 75 mL via INTRAVENOUS

## 2016-02-27 MED ORDER — ATORVASTATIN CALCIUM 20 MG PO TABS
40.0000 mg | ORAL_TABLET | Freq: Every day | ORAL | Status: DC
  Administered 2016-02-27 – 2016-02-29 (×3): 40 mg via ORAL
  Filled 2016-02-27 (×3): qty 2

## 2016-02-27 MED ORDER — MORPHINE SULFATE (PF) 4 MG/ML IV SOLN
4.0000 mg | Freq: Once | INTRAVENOUS | Status: AC
  Administered 2016-02-27: 4 mg via INTRAVENOUS
  Filled 2016-02-27: qty 1

## 2016-02-27 MED ORDER — ACETAMINOPHEN 325 MG PO TABS
650.0000 mg | ORAL_TABLET | Freq: Four times a day (QID) | ORAL | Status: DC | PRN
Start: 2016-02-27 — End: 2016-03-01

## 2016-02-27 MED ORDER — ACETAMINOPHEN 650 MG RE SUPP
650.0000 mg | Freq: Four times a day (QID) | RECTAL | Status: DC | PRN
Start: 2016-02-27 — End: 2016-03-01

## 2016-02-27 MED ORDER — ESCITALOPRAM OXALATE 10 MG PO TABS
20.0000 mg | ORAL_TABLET | Freq: Every day | ORAL | Status: DC
  Administered 2016-02-28 – 2016-03-01 (×3): 20 mg via ORAL
  Filled 2016-02-27 (×3): qty 2

## 2016-02-27 MED ORDER — ONDANSETRON HCL 4 MG PO TABS: 4.0000 mg | ORAL_TABLET | Freq: Four times a day (QID) | ORAL | Status: DC | PRN

## 2016-02-27 MED ORDER — ALBUTEROL SULFATE (2.5 MG/3ML) 0.083% IN NEBU
2.5000 mg | INHALATION_SOLUTION | RESPIRATORY_TRACT | Status: DC | PRN
  Administered 2016-02-28 – 2016-02-29 (×2): 2.5 mg via RESPIRATORY_TRACT
  Filled 2016-02-27 (×2): qty 3

## 2016-02-27 MED ORDER — AZITHROMYCIN 500 MG PO TABS
500.0000 mg | ORAL_TABLET | Freq: Once | ORAL | Status: AC
  Administered 2016-02-27: 500 mg via ORAL
  Filled 2016-02-27: qty 1

## 2016-02-27 MED ORDER — TIOTROPIUM BROMIDE MONOHYDRATE 18 MCG IN CAPS
1.0000 | ORAL_CAPSULE | Freq: Every day | RESPIRATORY_TRACT | Status: DC
  Administered 2016-02-28 – 2016-03-01 (×3): 18 ug via RESPIRATORY_TRACT
  Filled 2016-02-27: qty 5

## 2016-02-27 MED ORDER — MORPHINE SULFATE (PF) 2 MG/ML IV SOLN
2.0000 mg | INTRAVENOUS | Status: DC | PRN
  Administered 2016-02-27 – 2016-03-01 (×6): 2 mg via INTRAVENOUS
  Filled 2016-02-27 (×6): qty 1

## 2016-02-27 MED ORDER — ALBUTEROL SULFATE (2.5 MG/3ML) 0.083% IN NEBU
5.0000 mg | INHALATION_SOLUTION | Freq: Once | RESPIRATORY_TRACT | Status: AC
  Administered 2016-02-27: 5 mg via RESPIRATORY_TRACT

## 2016-02-27 MED ORDER — METHYLPREDNISOLONE SODIUM SUCC 125 MG IJ SOLR
125.0000 mg | INTRAMUSCULAR | Status: AC
  Administered 2016-02-27: 125 mg via INTRAVENOUS
  Filled 2016-02-27: qty 2

## 2016-02-27 MED ORDER — SODIUM CHLORIDE 0.9% FLUSH
3.0000 mL | INTRAVENOUS | Status: DC | PRN
Start: 2016-02-27 — End: 2016-03-01

## 2016-02-27 MED ORDER — METHYLPREDNISOLONE SODIUM SUCC 125 MG IJ SOLR
60.0000 mg | Freq: Two times a day (BID) | INTRAMUSCULAR | Status: DC
  Administered 2016-02-28 – 2016-02-29 (×3): 60 mg via INTRAVENOUS
  Filled 2016-02-27 (×3): qty 2

## 2016-02-27 MED ORDER — PREGABALIN 75 MG PO CAPS
200.0000 mg | ORAL_CAPSULE | Freq: Two times a day (BID) | ORAL | Status: DC
  Administered 2016-02-27 – 2016-03-01 (×6): 200 mg via ORAL
  Filled 2016-02-27 (×6): qty 2

## 2016-02-27 MED ORDER — POLYETHYLENE GLYCOL 3350 17 G PO PACK: 17.0000 g | PACK | Freq: Every day | ORAL | Status: DC | PRN

## 2016-02-27 MED ORDER — QUETIAPINE FUMARATE ER 300 MG PO TB24
300.0000 mg | ORAL_TABLET | Freq: Every day | ORAL | Status: DC
  Administered 2016-02-27 – 2016-02-29 (×3): 300 mg via ORAL
  Filled 2016-02-27 (×3): qty 1

## 2016-02-27 MED ORDER — ALPRAZOLAM 0.5 MG PO TABS
0.5000 mg | ORAL_TABLET | Freq: Two times a day (BID) | ORAL | Status: DC | PRN
  Administered 2016-02-28 – 2016-03-01 (×4): 0.5 mg via ORAL
  Filled 2016-02-27 (×4): qty 1

## 2016-02-27 MED ORDER — ONDANSETRON HCL 4 MG/2ML IJ SOLN: 4.0000 mg | Freq: Four times a day (QID) | INTRAMUSCULAR | Status: DC | PRN

## 2016-02-27 MED ORDER — FUROSEMIDE 20 MG PO TABS
20.0000 mg | ORAL_TABLET | Freq: Every day | ORAL | Status: DC
Start: 2016-02-28 — End: 2016-02-29
  Administered 2016-02-28 – 2016-02-29 (×2): 20 mg via ORAL
  Filled 2016-02-27 (×2): qty 1

## 2016-02-27 MED ORDER — ALBUTEROL SULFATE (2.5 MG/3ML) 0.083% IN NEBU
INHALATION_SOLUTION | RESPIRATORY_TRACT | Status: AC
  Administered 2016-02-27: 5 mg via RESPIRATORY_TRACT
  Filled 2016-02-27: qty 6

## 2016-02-27 MED ORDER — ETODOLAC 200 MG PO CAPS
200.0000 mg | ORAL_CAPSULE | Freq: Three times a day (TID) | ORAL | Status: DC | PRN
  Filled 2016-02-27 (×2): qty 1

## 2016-02-27 MED ORDER — FAMOTIDINE 20 MG PO TABS
20.0000 mg | ORAL_TABLET | Freq: Two times a day (BID) | ORAL | Status: DC
  Administered 2016-02-28 – 2016-03-01 (×5): 20 mg via ORAL
  Filled 2016-02-27 (×6): qty 1

## 2016-02-27 MED ORDER — IPRATROPIUM-ALBUTEROL 0.5-2.5 (3) MG/3ML IN SOLN
3.0000 mL | Freq: Once | RESPIRATORY_TRACT | Status: AC
  Administered 2016-02-27: 3 mL via RESPIRATORY_TRACT

## 2016-02-27 MED ORDER — IPRATROPIUM-ALBUTEROL 0.5-2.5 (3) MG/3ML IN SOLN
3.0000 mL | Freq: Once | RESPIRATORY_TRACT | Status: AC
  Administered 2016-02-27: 3 mL via RESPIRATORY_TRACT
  Filled 2016-02-27: qty 3

## 2016-02-27 MED ORDER — TIZANIDINE HCL 4 MG PO TABS
4.0000 mg | ORAL_TABLET | Freq: Three times a day (TID) | ORAL | Status: DC | PRN
  Administered 2016-02-28: 4 mg via ORAL
  Filled 2016-02-27: qty 1

## 2016-02-27 MED ORDER — SODIUM CHLORIDE 0.9% FLUSH
3.0000 mL | Freq: Two times a day (BID) | INTRAVENOUS | Status: DC
  Administered 2016-02-27 – 2016-03-01 (×6): 3 mL via INTRAVENOUS

## 2016-02-27 MED ORDER — SODIUM CHLORIDE 0.9 % IV SOLN: 250.0000 mL | INTRAVENOUS | Status: DC | PRN

## 2016-02-27 MED ORDER — POTASSIUM CHLORIDE ER 10 MEQ PO TBCR
10.0000 meq | EXTENDED_RELEASE_TABLET | Freq: Every day | ORAL | Status: DC
  Administered 2016-02-28 – 2016-02-29 (×2): 10 meq via ORAL
  Filled 2016-02-27 (×4): qty 1

## 2016-02-27 MED ORDER — ENOXAPARIN SODIUM 40 MG/0.4ML ~~LOC~~ SOLN
40.0000 mg | Freq: Two times a day (BID) | SUBCUTANEOUS | Status: DC
  Administered 2016-02-27 – 2016-03-01 (×6): 40 mg via SUBCUTANEOUS
  Filled 2016-02-27 (×6): qty 0.4

## 2016-02-27 NOTE — Progress Notes (Signed)
PHARMACIST - PHYSICIAN COMMUNICATION  CONCERNING:  Enoxaparin (Lovenox) for DVT Prophylaxis    RECOMMENDATION: Patient was prescribed enoxaprin 40mg  q24 hours for VTE prophylaxis.   Filed Weights   02/27/16 1546  Weight: 272 lb (123.4 kg)    Body mass index is 50.56 kg/m.  Estimated Creatinine Clearance: 75.1 mL/min (by C-G formula based on SCr of 1.04 mg/dL (H)).   Based on [place] patient is candidate for enoxaparin 40mg  every 12 hour dosing due to BMI being >40.   DESCRIPTION: Pharmacy has adjusted enoxaparin dose per [facility] policy.  Patient is now receiving enoxaparin 40mg  every 12 hours.   Nancy Fetter, PharmD Clinical Pharmacist  02/27/2016 9:36 PM

## 2016-02-27 NOTE — ED Provider Notes (Signed)
[facility] Emergency Department Provider Note ____________________________________________   None    (approximate)  I have reviewed the triage vital signs and the nursing notes.   HISTORY  Chief Complaint Shortness of Breath  HPI Nicole Austin is a 56 y.o. female here for evaluation of cough and shortness of breath.  Patient reports she feels like she is having a "COPD attack". For about a week she's been coughing, she is using her steroid inhaler, it is not improved. She reports she feels extremely short of breath that it up to walk. She is utilizing her friend's albuterol nebulizer today, but despite that continued feeling short of breath.  Reports a dry productive cough and reports she's been coughing so much is giving her moderate to severe pain along the pain of the ribs along the left lateral chest.     Past Medical History:  Diagnosis Date  . ADHD (attention deficit hyperactivity disorder)   . Arthritis   . Asthma   . Bipolar 1 disorder ([place])   . COPD (chronic obstructive pulmonary disease) ([place])   . Depression   . GERD (gastroesophageal reflux disease) 08/30/2015  . Low HDL (under 40) 08/30/2015  . MI (myocardial infarction)   . PTSD (post-traumatic stress disorder)   . Rhabdomyolysis   . Stroke [facility])     Patient Active Problem List   Diagnosis Date Noted  . Pain in right knee 01/05/2016  . Trapezius muscle spasm 12/17/2015  . Facet hypertrophy of lumbar region 11/25/2015  . Fall at home 11/25/2015  . Abnormal CBC 11/09/2015  . Vitamin B12 deficiency 10/24/2015  . Fatigue 10/04/2015  . Encounter for medication monitoring 10/04/2015  . Hypoalbuminemia 10/04/2015  . Fibromyalgia 08/30/2015  . Low HDL (under 40) 08/30/2015  . GERD (gastroesophageal reflux disease) 08/30/2015  . Other specified behavioral and emotional disorders with onset usually occurring in childhood and adolescence 07/13/2015  . Acute myocardial infarction  06/30/2015  . OP (osteoporosis) 06/30/2015  . Pain in thoracic spine 06/30/2015  . Personal history of other diseases of the musculoskeletal system and connective tissue 06/30/2015  . Primary malignant neoplasm ([place]) 06/30/2015  . Apnea, sleep 06/30/2015  . Controlled substance agreement signed 06/21/2015  . Other long term (current) drug therapy 06/21/2015  . HLD (hyperlipidemia) 05/13/2015  . Foot ulcer, left ([place]) 04/27/2015  . Chronic pain disorder 04/27/2015  . Chronic pain associated with significant psychosocial dysfunction 04/27/2015  . Non-pressure ulcer of lower extremity ([place]) 04/27/2015  . Stroke ([place]) 03/25/2015  . Cerebral infarction ([place]) 03/25/2015  . Morbid obesity ([place]) 12/22/2014  . Bipolar affective disorder, current episode depressed ([place]) 12/22/2014  . Gout 08/16/2014  . Muscle spasms of both lower extremities 08/16/2014  . Spasm 08/16/2014  . Back pain, chronic 08/13/2014  . Low back pain with sciatica 08/04/2014  . Current tobacco use 08/04/2014  . Polysubstance abuse 07/04/2014  . Psychoactive substance abuse 07/04/2014  . Anxiety, generalized 11/24/2013  . Imbalance 11/24/2013  . Blurred vision 11/24/2013  . Other abnormalities of gait and mobility 11/24/2013  . Unspecified visual disturbance 11/24/2013  . Moderate COPD (chronic obstructive pulmonary disease) ([place]) 11/18/2013  . HPV (human papilloma virus) infection 07/17/2013  . Arthritis of knee, degenerative 07/15/2013  . Personal history of other specified conditions 07/31/2011    Past Surgical History:  Procedure Laterality Date  . BACK SURGERY    . REPLACEMENT TOTAL KNEE Left     Prior to Admission medications   Medication Sig Start  Date End Date Taking? Authorizing Provider  albuterol (PROVENTIL HFA;VENTOLIN HFA) 108 (90 Base) MCG/ACT inhaler Inhale 2 puffs into the lungs every 6 (six) hours as needed for wheezing or shortness of breath. 05/31/15   Orbie Pyo, MD  albuterol  (PROVENTIL) (2.5 MG/3ML) 0.083% nebulizer solution Take 3 mLs (2.5 mg total) by nebulization every 4 (four) hours as needed for wheezing or shortness of breath. 02/10/16   Arnetha Courser, MD  ALPRAZolam Duanne [place]on) 0.5 MG tablet Take 1 tablet (0.5 mg total) by mouth 2 (two) times daily as needed for anxiety. 01/09/16   Rainey Pines, MD  aspirin EC 81 MG tablet Take 81 mg by mouth daily.    Historical Provider, MD  atorvastatin (LIPITOR) 40 MG tablet Take 1 tablet (40 mg total) by mouth at bedtime. 12/14/15   Arnetha Courser, MD  divalproex (DEPAKOTE) 250 MG DR tablet Take 1 tablet (250 mg total) by mouth 2 (two) times daily. 01/09/16   Rainey Pines, MD  escitalopram (LEXAPRO) 20 MG tablet Take 1 tablet (20 mg total) by mouth daily. 01/09/16   Rainey Pines, MD  etodolac (LODINE) 200 MG capsule Take 1 capsule (200 mg total) by mouth every 8 (eight) hours. PRN pain; take with food; do NOT take w/other NSAIDs 01/27/16   Arnetha Courser, MD  furosemide (LASIX) 20 MG tablet Take 1 tablet (20 mg total) by mouth daily. 01/25/16   Arnetha Courser, MD  lidocaine (XYLOCAINE) 5 % ointment Apply 1 application topically as needed for mild pain.    Historical Provider, MD  LYRICA 200 MG capsule Take 1 capsule (200 mg total) by mouth 2 (two) times daily. 12/14/15   Arnetha Courser, MD  Multiple Vitamin (MULTIVITAMIN WITH MINERALS) TABS tablet Take 1 tablet by mouth daily.    Historical Provider, MD  potassium chloride (KLOR-CON 10) 10 MEQ tablet One by mouth daily 12/13/15   Arnetha Courser, MD  QUEtiapine (SEROQUEL XR) 300 MG 24 hr tablet Take 1 tablet (300 mg total) by mouth at bedtime. 01/09/16   Rainey Pines, MD  ranitidine (ZANTAC) 150 MG tablet Take 1 tablet (150 mg total) by mouth 2 (two) times daily. 08/29/15   Arnetha Courser, MD  Respiratory Therapy Supplies (NEBULIZER COMPRESSOR) KIT One nebulizer kit/machine with tubing; for use with nebulized albuterol 02/10/16   Arnetha Courser, MD  Tiotropium Bromide Monohydrate  (SPIRIVA RESPIMAT) 2.5 MCG/ACT AERS Inhale 2 puffs into the lungs daily. 10/13/15   Arnetha Courser, MD  tiZANidine (ZANAFLEX) 4 MG tablet Take 4 mg by mouth every 8 (eight) hours as needed. Take 4 mg every 8 hours as needed.    Historical Provider, MD    Allergies Chantix [varenicline] and Codeine  Family History  Problem Relation Age of Onset  . Hernia Mother   . Heart disease Mother   . OCD Mother   . Diabetes Mother   . Parkinson's disease Father   . Bipolar disorder Sister   . Schizophrenia Sister   . ADD / ADHD Sister   . Alcohol abuse Brother   . Bipolar disorder Sister   . Paranoid behavior Sister   . ADD / ADHD Sister     Social History Social History  Substance Use Topics  . Smoking status: Current Every Day Smoker    Packs/day: 1.00    Years: 36.00    Types: Cigarettes    Start date: 10/11/1984  . Smokeless tobacco: Never Used  . Alcohol use No  Review of Systems Constitutional: No fever/chills Eyes: No visual changes. ENT: No sore throat. Cardiovascular: Sharp pain in the left chest with coughing and breathing.  Respiratory: Denies shortness of breath. Gastrointestinal: No abdominal pain.  No nausea, no vomiting.  No diarrhea.  No constipation. Genitourinary: Negative for dysuria. Musculoskeletal: Negative for back pain. Skin: Negative for rash. Neurological: Negative for headaches, focal weakness or numbness.  10-point ROS otherwise negative.  ____________________________________________   PHYSICAL EXAM:  VITAL SIGNS: ED Triage Vitals  Enc Vitals Group     BP      Pulse      Resp      Temp      Temp src      SpO2      Weight      Height      Head Circumference      Peak Flow      Pain Score      Pain Loc      Pain Edu?      Excl. in [place]?     Constitutional: Alert and oriented. Moderately dyspneic, sitting upright, and expiratory wheezing heard from a few feet away. She appears dyspneic. Also appears to have moderate pain when taking  deep breaths, slight splinting on the left. Eyes: Conjunctivae are normal. PERRL. EOMI. Head: Atraumatic. Nose: No congestion/rhinnorhea. Mouth/Throat: Mucous membranes are moist.  Oropharynx non-erythematous. Neck: No stridor.   Cardiovascular: Normal rate, regular rhythm. Grossly normal heart sounds.  Good peripheral circulation. Respiratory: Moderate work of breathing, sitting upright, slight splinting. Moderate end expiratory wheezing heard throughout. Mild tachypnea with accessory muscle use speaking 1-2 word sentences. Gastrointestinal: Soft and nontender. No distention. No abdominal bruits. No CVA tenderness. Musculoskeletal: No lower extremity tenderness nor edema.  No joint effusions. Neurologic:  Normal speech and language. No gross focal neurologic deficits are appreciated.  Skin:  Skin is warm, dry and intact. No rash noted. Psychiatric: Mood and affect are normal. Speech and behavior are normal.  ____________________________________________   LABS (all labs ordered are listed, but only abnormal results are displayed)  Labs Reviewed  CBC - Abnormal; Notable for the following:       Result Value   RDW 15.1 (*)    All other components within normal limits  BASIC METABOLIC PANEL - Abnormal; Notable for the following:    Chloride 100 (*)    CO2 33 (*)    Glucose, Bld 114 (*)    Creatinine, Ser 1.04 (*)    GFR calc non Af Amer 59 (*)    All other components within normal limits  FIBRIN DERIVATIVES D-DIMER ([facility] ONLY) - Abnormal; Notable for the following:    Fibrin derivatives D-dimer ([facility]) 909 (*)    All other components within normal limits  TROPONIN I   ____________________________________________  EKG  Reviewed injury by me at 1545 Heart rate 90 QRS 90 QTc 490 Normal axis, mild baseline artifact, normal sinus rhythm, nonspecific T wave abnormality seen in anteroseptal distribution, and no clear evidence of acute  ischemia ____________________________________________  RADIOLOGY  Dg Chest 2 View  Result Date: 02/27/2016 CLINICAL DATA:  Patient with shortness of breath and cough for 1 week. EXAM: CHEST  2 VIEW COMPARISON:  Chest radiograph 10/26/2015. FINDINGS: Stable cardiomegaly. No consolidative pulmonary opacities. No pleural effusion or pneumothorax. Mid thoracic spine degenerative changes. IMPRESSION: Cardiomegaly.  No acute cardiopulmonary process. Electronically Signed   By: Lovey Newcomer M.D.   On: 02/27/2016 16:36   Ct Angio Chest Pe W  Or Wo Contrast  Result Date: 02/27/2016 CLINICAL DATA:  Shortness of breath and cough for 1 week EXAM: CT ANGIOGRAPHY CHEST WITH CONTRAST TECHNIQUE: Multidetector CT imaging of the chest was performed using the standard protocol during bolus administration of intravenous contrast. Multiplanar CT image reconstructions and MIPs were obtained to evaluate the vascular anatomy. CONTRAST:  75 mL Isovue 370 intravenous COMPARISON:  Chest x-ray 01/01/ 2018, CT chest 10/27/2015 FINDINGS: Cardiovascular: No evidence for filling defect within the central or segmental pulmonary arteries to suggest an acute embolus. Left-sided aortic arch with normal 3 vessel origin. No aneurysm or dissection. Heart size upper normal. Small pericardial effusion is present. Mediastinum/Nodes: Mild mediastinal adenopathy is present. A right paratracheal lymph node, series 4, image number 18 measures 8 mm. Additional right paratracheal lymph node measures 12 mm in diameter, series 4, image number 27. Lobulated lymph node adjacent to the distal arch measures 9 mm, series 4, image number 28. Trachea and mainstem bronchi appear within normal limits. The imaged thyroid glands is unremarkable. Esophagus is within normal limits. Lungs/Pleura: Mild apical emphysematous changes, right greater than left are again visualized. There is no focal consolidation or pleural effusion identified. Scattered hazy densities may  reflect patchy foci of atelectasis. Upper Abdomen: Enlarged fatty appearing liver. No acute abnormality. Musculoskeletal: Stable skeletal structures with degenerative changes of the spine. Multiple mid to lower thoracic spinous process/posterior element deformities, probably postsurgical. This is unchanged. Review of the MIP images confirms the above findings. IMPRESSION: 1. No CT evidence for acute pulmonary embolus or aortic dissection. 2. Small pericardial effusion 3. Mild mediastinal adenopathy, nonspecific and could be secondary to inflammation or infection. 4. Mild apical emphysematous disease Electronically Signed   By: Donavan Foil M.D.   On: 02/27/2016 18:22    ____________________________________________   PROCEDURES  Procedure(s) performed: None  Procedures  Critical Care performed: No  ____________________________________________   INITIAL IMPRESSION / ASSESSMENT AND PLAN / ED COURSE  Pertinent labs & imaging results that were available during my care of the patient were reviewed by me and considered in my medical decision making (see chart for details).    Clinical Course as of Feb 26 2045  Molli Knock Feb 27, 2016  1702 Patient reports she is feeling better and her chest pain is much better. D-dimer positive, CT imaging ordered. She continues to have moderate and expiratory wheezing with mild increased work of breathing with mid to low 90s on 2 L nasal cannula. I will give additional 2 mg morphine, and additional albuterol this time. Continue following the patient carefully  [MQ]    Clinical Course User Index [MQ] Delman Kitten, MD   ----------------------------------------- 8:46 PM on 02/27/2016 -----------------------------------------  Patient continues to do well on nasal cane oxygen, however when removed she desats again to the mid to high 80s. Patient will be admitted for ongoing care. Overall status improved, additional DuoNeb given at this time. Ongoing care and  admission by the hospitalist service. Patient agreeable with plan discussed the hospice, likely start the patient on doxycycline as her QT is prolonged.  ____________________________________________   FINAL CLINICAL IMPRESSION(S) / ED DIAGNOSES  Final diagnoses:  Right-sided chest pain  COPD exacerbation (HCC)  Prolonged QT interval on EKG    NEW MEDICATIONS STARTED DURING THIS VISIT:  New Prescriptions   No medications on file     Note:  This document was prepared using Dragon voice recognition software and may include unintentional dictation errors.     Delman Kitten, MD 02/27/16 2047

## 2016-02-27 NOTE — Progress Notes (Signed)
[place], [place].   02/27/2016  Patient: Nicole Austin   Date of Birth:  10-20-1960  Date of admission:  02/27/2016  Date of Discharge  02/27/2016    To Whom it May Concern   Nicole Austin  is admitted at [facility] [place] and will likely be here till 03/01/2015.  If you have any questions or concerns, please don't hesitate to call.  Sincerely,   Hillary Bow R M.D Office : [phone]   .

## 2016-02-27 NOTE — ED Notes (Signed)
ED Provider at bedside. 

## 2016-02-27 NOTE — ED Notes (Signed)
Meal tray given to patient.

## 2016-02-27 NOTE — ED Notes (Signed)
Patient took her O2 off to eat and dropped her saturation level to 85%.

## 2016-02-27 NOTE — ED Notes (Signed)
Pt transported to room 115 

## 2016-02-27 NOTE — ED Triage Notes (Signed)
Pt presents via EMS c/o SOB and cough x1 week. Reports hx COPD. No improvement with nebulizer at home. RA saturation 88%, improved with 02.

## 2016-02-27 NOTE — H&P (Signed)
[facility] NAME: Nicole Austin    MR#:  [id number]  DATE OF BIRTH:  Apr 20, 1960  DATE OF ADMISSION:  02/27/2016  PRIMARY CARE PHYSICIAN: Enid Derry, MD   REQUESTING/REFERRING PHYSICIAN: Dr. Jacqualine Code  CHIEF COMPLAINT:   Chief Complaint  Patient presents with  . Shortness of Breath    HISTORY OF PRESENT ILLNESS:  Nicole Austin  is a 57 y.o. female with a known history of COPD, hypertension, PTSD, CAD presents to the emergency room complaining of worsening shortness of breath and wheezing for 1 week. She thinks she caught a cold from her sister which slowly got worse. She has had a productive cough. No chest pain or orthopnea or edema. She continued to use her inhalers at home. She has been waiting for a nebulizer from her primary care physician's office. Here in the emergency room patient had IV steroids and multiple nebulizer treatment and she continued to desat to 85% on room air and is being admitted to the hospitalist service with diffuse wheezing.  PAST MEDICAL HISTORY:   Past Medical History:  Diagnosis Date  . ADHD (attention deficit hyperactivity disorder)   . Arthritis   . Asthma   . Bipolar 1 disorder ([place])   . COPD (chronic obstructive pulmonary disease) ([place])   . Depression   . GERD (gastroesophageal reflux disease) 08/30/2015  . Low HDL (under 40) 08/30/2015  . MI (myocardial infarction)   . PTSD (post-traumatic stress disorder)   . Rhabdomyolysis   . Stroke [facility])     PAST SURGICAL HISTORY:   Past Surgical History:  Procedure Laterality Date  . BACK SURGERY    . REPLACEMENT TOTAL KNEE Left     SOCIAL HISTORY:   Social History  Substance Use Topics  . Smoking status: Current Every Day Smoker    Packs/day: 1.00    Years: 36.00    Types: Cigarettes    Start date: 10/11/1984  . Smokeless tobacco: Never Used  . Alcohol use No    FAMILY HISTORY:   Family History  Problem Relation Age of Onset  . Hernia  Mother   . Heart disease Mother   . OCD Mother   . Diabetes Mother   . Parkinson's disease Father   . Bipolar disorder Sister   . Schizophrenia Sister   . ADD / ADHD Sister   . Alcohol abuse Brother   . Bipolar disorder Sister   . Paranoid behavior Sister   . ADD / ADHD Sister     DRUG ALLERGIES:   Allergies  Allergen Reactions  . Chantix [Varenicline] Nausea Only  . Codeine Itching    REVIEW OF SYSTEMS:   Review of Systems  Constitutional: Positive for malaise/fatigue. Negative for chills, fever and weight loss.  HENT: Negative for hearing loss and nosebleeds.   Eyes: Negative for blurred vision, double vision and pain.  Respiratory: Positive for cough, sputum production, shortness of breath and wheezing. Negative for hemoptysis.   Cardiovascular: Negative for chest pain, palpitations, orthopnea and leg swelling.  Gastrointestinal: Negative for abdominal pain, constipation, diarrhea, nausea and vomiting.  Genitourinary: Negative for dysuria and hematuria.  Musculoskeletal: Negative for back pain, falls and myalgias.  Skin: Negative for rash.  Neurological: Positive for weakness. Negative for dizziness, tremors, sensory change, speech change, focal weakness, seizures and headaches.  Endo/Heme/Allergies: Does not bruise/bleed easily.  Psychiatric/Behavioral: Negative for depression and memory loss. The patient is not nervous/anxious.     MEDICATIONS AT HOME:  Prior to Admission medications   Medication Sig Start Date End Date Taking? Authorizing Provider  albuterol (PROVENTIL HFA;VENTOLIN HFA) 108 (90 Base) MCG/ACT inhaler Inhale 2 puffs into the lungs every 6 (six) hours as needed for wheezing or shortness of breath. 05/31/15   Orbie Pyo, MD  albuterol (PROVENTIL) (2.5 MG/3ML) 0.083% nebulizer solution Take 3 mLs (2.5 mg total) by nebulization every 4 (four) hours as needed for wheezing or shortness of breath. 02/10/16   Arnetha Courser, MD  ALPRAZolam Duanne Moron)  0.5 MG tablet Take 1 tablet (0.5 mg total) by mouth 2 (two) times daily as needed for anxiety. 01/09/16   Rainey Pines, MD  aspirin EC 81 MG tablet Take 81 mg by mouth daily.    Historical Provider, MD  atorvastatin (LIPITOR) 40 MG tablet Take 1 tablet (40 mg total) by mouth at bedtime. 12/14/15   Arnetha Courser, MD  divalproex (DEPAKOTE) 250 MG DR tablet Take 1 tablet (250 mg total) by mouth 2 (two) times daily. 01/09/16   Rainey Pines, MD  escitalopram (LEXAPRO) 20 MG tablet Take 1 tablet (20 mg total) by mouth daily. 01/09/16   Rainey Pines, MD  etodolac (LODINE) 200 MG capsule Take 1 capsule (200 mg total) by mouth every 8 (eight) hours. PRN pain; take with food; do NOT take w/other NSAIDs 01/27/16   Arnetha Courser, MD  furosemide (LASIX) 20 MG tablet Take 1 tablet (20 mg total) by mouth daily. 01/25/16   Arnetha Courser, MD  lidocaine (XYLOCAINE) 5 % ointment Apply 1 application topically as needed for mild pain.    Historical Provider, MD  LYRICA 200 MG capsule Take 1 capsule (200 mg total) by mouth 2 (two) times daily. 12/14/15   Arnetha Courser, MD  Multiple Vitamin (MULTIVITAMIN WITH MINERALS) TABS tablet Take 1 tablet by mouth daily.    Historical Provider, MD  potassium chloride (KLOR-CON 10) 10 MEQ tablet One by mouth daily 12/13/15   Arnetha Courser, MD  QUEtiapine (SEROQUEL XR) 300 MG 24 hr tablet Take 1 tablet (300 mg total) by mouth at bedtime. 01/09/16   Rainey Pines, MD  ranitidine (ZANTAC) 150 MG tablet Take 1 tablet (150 mg total) by mouth 2 (two) times daily. 08/29/15   Arnetha Courser, MD  Respiratory Therapy Supplies (NEBULIZER COMPRESSOR) KIT One nebulizer kit/machine with tubing; for use with nebulized albuterol 02/10/16   Arnetha Courser, MD  Tiotropium Bromide Monohydrate (SPIRIVA RESPIMAT) 2.5 MCG/ACT AERS Inhale 2 puffs into the lungs daily. 10/13/15   Arnetha Courser, MD  tiZANidine (ZANAFLEX) 4 MG tablet Take 4 mg by mouth every 8 (eight) hours as needed. Take 4 mg every 8 hours as  needed.    Historical Provider, MD     VITAL SIGNS:  Blood pressure 124/65, pulse 90, temperature 98.6 F (37 C), temperature source Oral, resp. rate 20, height 5' 1.5" (1.562 m), weight 123.4 kg (272 lb), SpO2 92 %.  PHYSICAL EXAMINATION:  Physical Exam  GENERAL:  56 y.o.-year-old patient lying in the bed With respiratory distress. Obese. EYES: Pupils equal, round, reactive to light and accommodation. No scleral icterus. Extraocular muscles intact.  HEENT: Head atraumatic, normocephalic. Oropharynx and nasopharynx clear. No oropharyngeal erythema, moist oral mucosa  NECK:  Supple, no jugular venous distention. No thyroid enlargement, no tenderness.  LUNGS: Bilateral wheezing and decreased air entry CARDIOVASCULAR: S1, S2 normal. No murmurs, rubs, or gallops.  ABDOMEN: Soft, nontender, nondistended. Bowel sounds present. No organomegaly or mass.  EXTREMITIES: No pedal edema, cyanosis, or clubbing. + 2 pedal & radial pulses b/l.   NEUROLOGIC: Cranial nerves II through XII are intact. No focal Motor or sensory deficits appreciated b/l PSYCHIATRIC: The patient is alert and oriented x 3. Good affect.  SKIN: No obvious rash, lesion, or ulcer.   LABORATORY PANEL:   CBC  Recent Labs Lab 02/27/16 1546  WBC 7.5  HGB 13.1  HCT 37.2  PLT 163   ------------------------------------------------------------------------------------------------------------------  Chemistries   Recent Labs Lab 02/27/16 1546  NA 139  K 4.1  CL 100*  CO2 33*  GLUCOSE 114*  BUN 8  CREATININE 1.04*  CALCIUM 8.9   ------------------------------------------------------------------------------------------------------------------  Cardiac Enzymes  Recent Labs Lab 02/27/16 1546  TROPONINI <0.03   ------------------------------------------------------------------------------------------------------------------  RADIOLOGY:  Dg Chest 2 View  Result Date: 02/27/2016 CLINICAL DATA:  Patient with  shortness of breath and cough for 1 week. EXAM: CHEST  2 VIEW COMPARISON:  Chest radiograph 10/26/2015. FINDINGS: Stable cardiomegaly. No consolidative pulmonary opacities. No pleural effusion or pneumothorax. Mid thoracic spine degenerative changes. IMPRESSION: Cardiomegaly.  No acute cardiopulmonary process. Electronically Signed   By: Lovey Newcomer M.D.   On: 02/27/2016 16:36   Ct Angio Chest Pe W Or Wo Contrast  Result Date: 02/27/2016 CLINICAL DATA:  Shortness of breath and cough for 1 week EXAM: CT ANGIOGRAPHY CHEST WITH CONTRAST TECHNIQUE: Multidetector CT imaging of the chest was performed using the standard protocol during bolus administration of intravenous contrast. Multiplanar CT image reconstructions and MIPs were obtained to evaluate the vascular anatomy. CONTRAST:  75 mL Isovue 370 intravenous COMPARISON:  Chest x-ray 01/01/ 2018, CT chest 10/27/2015 FINDINGS: Cardiovascular: No evidence for filling defect within the central or segmental pulmonary arteries to suggest an acute embolus. Left-sided aortic arch with normal 3 vessel origin. No aneurysm or dissection. Heart size upper normal. Small pericardial effusion is present. Mediastinum/Nodes: Mild mediastinal adenopathy is present. A right paratracheal lymph node, series 4, image number 18 measures 8 mm. Additional right paratracheal lymph node measures 12 mm in diameter, series 4, image number 27. Lobulated lymph node adjacent to the distal arch measures 9 mm, series 4, image number 28. Trachea and mainstem bronchi appear within normal limits. The imaged thyroid glands is unremarkable. Esophagus is within normal limits. Lungs/Pleura: Mild apical emphysematous changes, right greater than left are again visualized. There is no focal consolidation or pleural effusion identified. Scattered hazy densities may reflect patchy foci of atelectasis. Upper Abdomen: Enlarged fatty appearing liver. No acute abnormality. Musculoskeletal: Stable skeletal  structures with degenerative changes of the spine. Multiple mid to lower thoracic spinous process/posterior element deformities, probably postsurgical. This is unchanged. Review of the MIP images confirms the above findings. IMPRESSION: 1. No CT evidence for acute pulmonary embolus or aortic dissection. 2. Small pericardial effusion 3. Mild mediastinal adenopathy, nonspecific and could be secondary to inflammation or infection. 4. Mild apical emphysematous disease Electronically Signed   By: Donavan Foil M.D.   On: 02/27/2016 18:22     IMPRESSION AND PLAN:   * Acute copd exacerbation With acute hypoxic respiratory failure -IV steroids, Antibiotics - Scheduled Nebulizers - Inhalers -Wean O2 as tolerated - Consult pulmonary if no improvement  * Hypertension Continue home medications  DVT prophylaxis with Lovenox    All the records are reviewed and case discussed with ED provider. Management plans discussed with the patient, family and they are in agreement.  CODE STATUS: FULL CODE  TOTAL TIME TAKING CARE OF THIS PATIENT: 40 minutes.  Hillary Bow R M.D on 02/27/2016 at 8:52 PM  Between 7am to 6pm - Pager - [phone]  After 6pm go to [email or web address] - password [email or web address] [facility]  Office  [phone]  CC: Primary care physician; Enid Derry, MD  Note: This dictation was prepared with Dragon dictation along with smaller phrase technology. Any transcriptional errors that result from this process are unintentional. [email or web address]!

## 2016-02-27 NOTE — ED Notes (Signed)
Patient assisted to the restroom with no difficulty.  Patient kept on O2 and no drop in saturation occurred.

## 2016-02-28 LAB — CBC
HEMATOCRIT: 36.8 % (ref 35.0–47.0)
HEMOGLOBIN: 12.8 g/dL (ref 12.0–16.0)
MCH: 31.5 pg (ref 26.0–34.0)
MCHC: 34.8 g/dL (ref 32.0–36.0)
MCV: 90.6 fL (ref 80.0–100.0)
Platelets: 177 10*3/uL (ref 150–440)
RBC: 4.06 MIL/uL (ref 3.80–5.20)
RDW: 14.7 % — AB (ref 11.5–14.5)
WBC: 7.3 10*3/uL (ref 3.6–11.0)

## 2016-02-28 LAB — BASIC METABOLIC PANEL
Anion gap: 6 (ref 5–15)
BUN: 10 mg/dL (ref 6–20)
CHLORIDE: 98 mmol/L — AB (ref 101–111)
CO2: 31 mmol/L (ref 22–32)
Calcium: 8.6 mg/dL — ABNORMAL LOW (ref 8.9–10.3)
Creatinine, Ser: 1.05 mg/dL — ABNORMAL HIGH (ref 0.44–1.00)
GFR calc Af Amer: >60 mL/min (ref >60)
GFR calc non Af Amer: 58 mL/min — ABNORMAL LOW (ref >60)
GLUCOSE: 202 mg/dL — AB (ref 65–99)
POTASSIUM: 4.7 mmol/L (ref 3.5–5.1)
Sodium: 135 mmol/L (ref 135–145)

## 2016-02-28 MED ORDER — GUAIFENESIN ER 600 MG PO TB12
600.0000 mg | ORAL_TABLET | Freq: Two times a day (BID) | ORAL | Status: DC
  Administered 2016-02-28 – 2016-03-01 (×4): 600 mg via ORAL
  Filled 2016-02-28 (×4): qty 1

## 2016-02-28 MED ORDER — KETOROLAC TROMETHAMINE 30 MG/ML IJ SOLN
30.0000 mg | Freq: Once | INTRAMUSCULAR | Status: AC
  Administered 2016-02-28: 30 mg via INTRAVENOUS
  Filled 2016-02-28: qty 1

## 2016-02-28 MED ORDER — ORAL CARE MOUTH RINSE
15.0000 mL | Freq: Two times a day (BID) | OROMUCOSAL | Status: DC
  Administered 2016-02-28 – 2016-03-01 (×5): 15 mL via OROMUCOSAL

## 2016-02-28 MED ORDER — OXYCODONE-ACETAMINOPHEN 5-325 MG PO TABS
1.0000 | ORAL_TABLET | Freq: Four times a day (QID) | ORAL | Status: DC | PRN
  Administered 2016-02-28: 1 via ORAL
  Administered 2016-02-29 – 2016-03-01 (×5): 2 via ORAL
  Filled 2016-02-28 (×3): qty 2
  Filled 2016-02-28: qty 1
  Filled 2016-02-28 (×2): qty 2

## 2016-02-28 MED ORDER — DOXYCYCLINE HYCLATE 100 MG PO TABS
100.0000 mg | ORAL_TABLET | Freq: Two times a day (BID) | ORAL | Status: DC
  Administered 2016-02-28 – 2016-03-01 (×4): 100 mg via ORAL
  Filled 2016-02-28 (×4): qty 1

## 2016-02-28 NOTE — Progress Notes (Signed)
[place] at [facility]                                                                                                                                                                                  Patient Demographics   Nicole Austin, is a 56 y.o. female, DOB - 1960/05/13, [id number]  Admit date - 02/27/2016   Admitting Physician Hillary Bow, MD  Outpatient Primary MD for the patient is Enid Derry, MD   LOS - 1  Subjective: Patient admitted with shortness of cough and wheezing. Complains of pain in the left side of her body due to coughing She is feeling better    Review of Systems:   CONSTITUTIONAL: No documented fever. No fatigue, weakness. No weight gain, no weight loss.  EYES: No blurry or double vision.  ENT: No tinnitus. No postnasal drip. No redness of the oropharynx.  RESPIRATORY: +cough, + wheeze, no hemoptysis. +dyspnea.  CARDIOVASCULAR: No chest pain. No orthopnea. No palpitations. No syncope.  GASTROINTESTINAL: No nausea, no vomiting or diarrhea. No abdominal pain. No melena or hematochezia.  GENITOURINARY: No dysuria or hematuria.  ENDOCRINE: No polyuria or nocturia. No heat or cold intolerance.  HEMATOLOGY: No anemia. No bruising. No bleeding.  INTEGUMENTARY: No rashes. No lesions.  MUSCULOSKELETAL: No arthritis. No swelling. No gout.  NEUROLOGIC: No numbness, tingling, or ataxia. No seizure-type activity.  PSYCHIATRIC: No anxiety. No insomnia. No ADD.    Vitals:   Vitals:   02/27/16 2143 02/28/16 0456 02/28/16 0500 02/28/16 1409  BP: (!) 129/56 (!) 126/52  112/74  Pulse: 96 86  94  Resp: 20 20  16   Temp: 97.8 F (36.6 C) 97.8 F (36.6 C)  98.1 F (36.7 C)  TempSrc: Oral Oral  Oral  SpO2: 90% 91%  97%  Weight: 278 lb 9.6 oz (126.4 kg)  278 lb 9.6 oz (126.4 kg)   Height: 5\' 2"  (1.575 m)       Wt Readings from Last 3 Encounters:  02/28/16 278 lb 9.6 oz (126.4 kg)  02/10/16 260 lb (117.9 kg)  01/10/16 175 lb (79.4  kg)     Intake/Output Summary (Last 24 hours) at 02/28/16 1524 Last data filed at 02/28/16 1345  Gross per 24 hour  Intake              480 ml  Output              200 ml  Net              280 ml    Physical Exam:   GENERAL: Pleasant-appearing in no apparent distress.  HEAD, EYES, EARS, NOSE AND THROAT: Atraumatic,  normocephalic. Extraocular muscles are intact. Pupils equal and reactive to light. Sclerae anicteric. No conjunctival injection. No oro-pharyngeal erythema.  NECK: Supple. There is no jugular venous distention. No bruits, no lymphadenopathy, no thyromegaly.  HEART: Regular rate and rhythm,. No murmurs, no rubs, no clicks.  LUNGS: bilateral wheezing, no crackles or rhonchi  ABDOMEN: Soft, flat, nontender, nondistended. Has good bowel sounds. No hepatosplenomegaly appreciated.  EXTREMITIES: No evidence of any cyanosis, clubbing, or peripheral edema.  +2 pedal and radial pulses bilaterally.  NEUROLOGIC: The patient is alert, awake, and oriented x3 with no focal motor or sensory deficits appreciated bilaterally.  SKIN: Moist and warm with no rashes appreciated.  Psych: Not anxious, depressed LN: No inguinal LN enlargement    Antibiotics   Anti-infectives    Start     Dose/Rate Route Frequency Ordered Stop   02/28/16 1800  doxycycline (VIBRA-TABS) tablet 100 mg     100 mg Oral Every 12 hours 02/28/16 1508     02/27/16 1545  azithromycin (ZITHROMAX) tablet 500 mg     500 mg Oral  Once 02/27/16 1542 02/27/16 1633      Medications   Scheduled Meds: . aspirin EC  81 mg Oral Daily  . atorvastatin  40 mg Oral QHS  . divalproex  250 mg Oral BID  . doxycycline  100 mg Oral Q12H  . enoxaparin (LOVENOX) injection  40 mg Subcutaneous Q12H  . escitalopram  20 mg Oral Daily  . famotidine  20 mg Oral BID  . furosemide  20 mg Oral Daily  . guaiFENesin  600 mg Oral BID  . ketorolac  30 mg Intravenous Once  . mouth rinse  15 mL Mouth Rinse BID  . methylPREDNISolone  (SOLU-MEDROL) injection  60 mg Intravenous Q12H  . potassium chloride  10 mEq Oral Daily  . pregabalin  200 mg Oral BID  . QUEtiapine  300 mg Oral QHS  . sodium chloride flush  3 mL Intravenous Q12H  . tiotropium  1 capsule Inhalation Daily   Continuous Infusions: PRN Meds:.sodium chloride, acetaminophen **OR** acetaminophen, albuterol, ALPRAZolam, etodolac, morphine injection, ondansetron **OR** ondansetron (ZOFRAN) IV, oxyCODONE-acetaminophen, polyethylene glycol, sodium chloride flush, tiZANidine   Data Review:   Micro Results No results found for this or any previous visit (from the past 240 hour(s)).  Radiology Reports Dg Chest 2 View  Result Date: 02/27/2016 CLINICAL DATA:  Patient with shortness of breath and cough for 1 week. EXAM: CHEST  2 VIEW COMPARISON:  Chest radiograph 10/26/2015. FINDINGS: Stable cardiomegaly. No consolidative pulmonary opacities. No pleural effusion or pneumothorax. Mid thoracic spine degenerative changes. IMPRESSION: Cardiomegaly.  No acute cardiopulmonary process. Electronically Signed   By: Lovey Newcomer M.D.   On: 02/27/2016 16:36   Ct Angio Chest Pe W Or Wo Contrast  Result Date: 02/27/2016 CLINICAL DATA:  Shortness of breath and cough for 1 week EXAM: CT ANGIOGRAPHY CHEST WITH CONTRAST TECHNIQUE: Multidetector CT imaging of the chest was performed using the standard protocol during bolus administration of intravenous contrast. Multiplanar CT image reconstructions and MIPs were obtained to evaluate the vascular anatomy. CONTRAST:  75 mL Isovue 370 intravenous COMPARISON:  Chest x-ray 01/01/ 2018, CT chest 10/27/2015 FINDINGS: Cardiovascular: No evidence for filling defect within the central or segmental pulmonary arteries to suggest an acute embolus. Left-sided aortic arch with normal 3 vessel origin. No aneurysm or dissection. Heart size upper normal. Small pericardial effusion is present. Mediastinum/Nodes: Mild mediastinal adenopathy is present. A right  paratracheal lymph node, series 4,  image number 18 measures 8 mm. Additional right paratracheal lymph node measures 12 mm in diameter, series 4, image number 27. Lobulated lymph node adjacent to the distal arch measures 9 mm, series 4, image number 28. Trachea and mainstem bronchi appear within normal limits. The imaged thyroid glands is unremarkable. Esophagus is within normal limits. Lungs/Pleura: Mild apical emphysematous changes, right greater than left are again visualized. There is no focal consolidation or pleural effusion identified. Scattered hazy densities may reflect patchy foci of atelectasis. Upper Abdomen: Enlarged fatty appearing liver. No acute abnormality. Musculoskeletal: Stable skeletal structures with degenerative changes of the spine. Multiple mid to lower thoracic spinous process/posterior element deformities, probably postsurgical. This is unchanged. Review of the MIP images confirms the above findings. IMPRESSION: 1. No CT evidence for acute pulmonary embolus or aortic dissection. 2. Small pericardial effusion 3. Mild mediastinal adenopathy, nonspecific and could be secondary to inflammation or infection. 4. Mild apical emphysematous disease Electronically Signed   By: Donavan Foil M.D.   On: 02/27/2016 18:22     CBC  Recent Labs Lab 02/27/16 1546 02/28/16 0534  WBC 7.5 7.3  HGB 13.1 12.8  HCT 37.2 36.8  PLT 163 177  MCV 90.5 90.6  MCH 31.8 31.5  MCHC 35.1 34.8  RDW 15.1* 14.7*    Chemistries   Recent Labs Lab 02/27/16 1546 02/28/16 0534  NA 139 135  K 4.1 4.7  CL 100* 98*  CO2 33* 31  GLUCOSE 114* 202*  BUN 8 10  CREATININE 1.04* 1.05*  CALCIUM 8.9 8.6*   ------------------------------------------------------------------------------------------------------------------ estimated creatinine clearance is 76.1 mL/min (by C-G formula based on SCr of 1.05 mg/dL  (H)). ------------------------------------------------------------------------------------------------------------------ No results for input(s): HGBA1C in the last 72 hours. ------------------------------------------------------------------------------------------------------------------ No results for input(s): CHOL, HDL, LDLCALC, TRIG, CHOLHDL, LDLDIRECT in the last 72 hours. ------------------------------------------------------------------------------------------------------------------ No results for input(s): TSH, T4TOTAL, T3FREE, THYROIDAB in the last 72 hours.  Invalid input(s): FREET3 ------------------------------------------------------------------------------------------------------------------ No results for input(s): VITAMINB12, FOLATE, FERRITIN, TIBC, IRON, RETICCTPCT in the last 72 hours.  Coagulation profile No results for input(s): INR, PROTIME in the last 168 hours.  No results for input(s): DDIMER in the last 72 hours.  Cardiac Enzymes  Recent Labs Lab 02/27/16 1546  TROPONINI <0.03   ------------------------------------------------------------------------------------------------------------------ Invalid input(s): POCBNP    Assessment & Plan  Patient is a 56 year old admitted with shortness of breath   Acute copd exacerbation With acute hypoxic respiratory failure -IV steroids, Antibiotics with oral for acute bronchitis - Scheduled Nebulizers - Inhalers -Wean O2 as tolerated - CT of the chest without pulmonary embolism  * Hypertension Continue therapy with Lasix  * Anxiety continue alprazolam and Seroquel  *Muscular pain due to coughing I will give her a dose of Toradol will use Percocet as needed  DVT prophylaxis with Lovenox      Code Status Orders        Start     Ordered   02/27/16 2049  Full code  Continuous     02/27/16 2049    Code Status History    Date Active Date Inactive Code Status Order ID Comments User Context    05/13/2015  4:03 AM 05/14/2015  6:08 PM Full Code [id number]  Lance Coon, MD ED   03/25/2015  9:18 PM 03/28/2015  8:48 PM Full Code [id number]  Lytle Butte, MD ED   03/02/2015  1:28 AM 03/08/2015  1:49 PM Full Code [id number]  Lytle Butte, MD ED   07/02/2014  3:15 PM 07/03/2014  5:27 PM Full Code [id number]  Loletha Grayer, MD ED           Consults  none   DVT Prophylaxis  Lovenox   Lab Results  Component Value Date   PLT 177 02/28/2016     Time Spent in minutes 28min Greater than 50% of time spent in care coordination and counseling patient regarding the condition and plan of care.   Dustin Flock M.D on 02/28/2016 at 3:24 PM  Between 7am to 6pm - Pager - [phone]  After 6pm go to [email or web address] - password [email or web address] [facility]   Office  [phone]

## 2016-02-29 LAB — HEMOGLOBIN A1C
HEMOGLOBIN A1C: 5.7 % — AB (ref 4.8–5.6)
Mean Plasma Glucose: 117 mg/dL

## 2016-02-29 MED ORDER — METHYLPREDNISOLONE SODIUM SUCC 40 MG IJ SOLR
40.0000 mg | Freq: Two times a day (BID) | INTRAMUSCULAR | Status: DC
  Administered 2016-02-29 – 2016-03-01 (×2): 40 mg via INTRAVENOUS
  Filled 2016-02-29 (×2): qty 1

## 2016-02-29 MED ORDER — IPRATROPIUM-ALBUTEROL 0.5-2.5 (3) MG/3ML IN SOLN
3.0000 mL | Freq: Four times a day (QID) | RESPIRATORY_TRACT | Status: DC
  Administered 2016-02-29 – 2016-03-01 (×3): 3 mL via RESPIRATORY_TRACT
  Filled 2016-02-29 (×5): qty 3

## 2016-02-29 MED ORDER — ACETYLCYSTEINE 20 % IN SOLN
4.0000 mL | Freq: Two times a day (BID) | RESPIRATORY_TRACT | Status: DC
  Administered 2016-02-29 – 2016-03-01 (×3): 4 mL via RESPIRATORY_TRACT
  Filled 2016-02-29 (×3): qty 4

## 2016-02-29 MED ORDER — BUDESONIDE 0.25 MG/2ML IN SUSP
0.2500 mg | Freq: Two times a day (BID) | RESPIRATORY_TRACT | Status: DC
  Administered 2016-02-29 – 2016-03-01 (×2): 0.25 mg via RESPIRATORY_TRACT
  Filled 2016-02-29 (×2): qty 2

## 2016-02-29 NOTE — Care Management (Signed)
Admitted to this facility with the diagnosis of COPD. Lives alone. Sister is Haynes Dage [phone]). Friend is Santiago Glad [phone]). Last seen Dr. Sanda Klein in November 2017. [facility] in the past. [facility] in the past. [facility] personal care services in the past. States that [facility] is arranging personal care services per [facility]. She is not sure when these services will start. No home oxygen. Last fall was 3 months ago. Good appetite. Prescriptions are filled at [facility]. Takes care of basic activities of daily living herself, doesn't drive. Maxie Barb or sister helps with errands. [place], rolling walker, and 2 canes in the home. Sister or friend will transport. Shelbie Ammons RN MSN CCM Care Management

## 2016-02-29 NOTE — Telephone Encounter (Signed)
Glitch in the system; I was not getting electronic refill requests from Dec 13th until yesterday; addressed with IT; handling refill requests now --------------------- Last Cr reviewed; rx approved

## 2016-02-29 NOTE — Care Management Important Message (Signed)
Important Message  Patient Details  Name: Nicole Austin MRN: [id number] Date of Birth: January 16, 1961   Medicare Important Message Given:  Yes    Shelbie Ammons, RN 02/29/2016, 12:17 PM

## 2016-02-29 NOTE — Telephone Encounter (Signed)
Glitch in our system; I was not receiving electronic refill requests from Dec 13 until yesterday; addressed with IT; addressing now ------------------------------------ Patient currently hospitalized Will not send in any refills, as meds may change

## 2016-02-29 NOTE — Progress Notes (Signed)
Resumed o2 at 3l after neb due to room sat of 88%.

## 2016-02-29 NOTE — Consult Note (Signed)
[facility] CM Inpatient Consult   02/29/2016  Nicole Austin Dec 07, 1960 [id number]   Patient is currently active with [facility] Care Management for chronic disease management services.  Patient has been engaged by a [employer]. Community case manager made hospital liaison know patient was in need of a home nebulizer. This information was passed to inpatient case manager. Our community based plan of care has focused on disease management and community resource support.  Patient will receive a post discharge transition of care call and will be evaluated for monthly home visits for assessments and disease process education.  Made Inpatient Case Manager aware that [facility] Care Management following. Of note, [facility] Care Management services does not replace or interfere with any services that are needed or arranged by inpatient case management or social work.  For additional questions or referrals please contact:  Daniil Labarge RN, BSN [facility] Liaison  [phone]) [employer] [phone]) Toll free office

## 2016-02-29 NOTE — Progress Notes (Signed)
[place] at [facility]                                                                                                                                                                                  Patient Demographics   Nicole Austin, is a 56 y.o. female, DOB - [birth year], [id number]  Admit date - 02/27/2016   Admitting Physician Hillary Bow, MD  Outpatient Primary MD for the patient is Enid Derry, MD   LOS - 2  Subjective: Patient admitted with shortness of cough and wheezing. Complains of pain in the left side of her body due to coughing She is feeling better    Review of Systems:   CONSTITUTIONAL: No documented fever. No fatigue, weakness. No weight gain, no weight loss.  EYES: No blurry or double vision.  ENT: No tinnitus. No postnasal drip. No redness of the oropharynx.  RESPIRATORY: +cough, + wheeze, no hemoptysis. +dyspnea.  CARDIOVASCULAR: No chest pain. No orthopnea. No palpitations. No syncope.  GASTROINTESTINAL: No nausea, no vomiting or diarrhea. No abdominal pain. No melena or hematochezia.  GENITOURINARY: No dysuria or hematuria.  ENDOCRINE: No polyuria or nocturia. No heat or cold intolerance.  HEMATOLOGY: No anemia. No bruising. No bleeding.  INTEGUMENTARY: No rashes. No lesions.  MUSCULOSKELETAL: No arthritis. No swelling. No gout.  NEUROLOGIC: No numbness, tingling, or ataxia. No seizure-type activity.  PSYCHIATRIC: No anxiety. No insomnia. No ADD.    Vitals:   Vitals:   02/29/16 0500 02/29/16 0601 02/29/16 0752 02/29/16 1116  BP:  (!) 118/53 (!) 119/56   Pulse:  82 70   Resp:  (!) 22 20   Temp:  97.5 F (36.4 C) 97.8 F (36.6 C)   TempSrc:  Oral Oral   SpO2:  92% 96% 92%  Weight: 279 lb 1.6 oz (126.6 kg)     Height:        Wt Readings from Last 3 Encounters:  02/29/16 279 lb 1.6 oz (126.6 kg)  02/10/16 260 lb (117.9 kg)  01/10/16 175 lb (79.4 kg)     Intake/Output Summary (Last 24 hours) at 02/29/16  1251 Last data filed at 02/28/16 1844  Gross per 24 hour  Intake              480 ml  Output              500 ml  Net              -20 ml    Physical Exam:   GENERAL: Pleasant-appearing in no apparent distress.  HEAD, EYES, EARS, NOSE AND THROAT: Atraumatic, normocephalic. Extraocular muscles are intact. Pupils equal and reactive to  light. Sclerae anicteric. No conjunctival injection. No oro-pharyngeal erythema.  NECK: Supple. There is no jugular venous distention. No bruits, no lymphadenopathy, no thyromegaly.  HEART: Regular rate and rhythm,. No murmurs, no rubs, no clicks.  LUNGS: bilateral wheezing, no crackles or rhonchi  ABDOMEN: Soft, flat, nontender, nondistended. Has good bowel sounds. No hepatosplenomegaly appreciated.  EXTREMITIES: No evidence of any cyanosis, clubbing, or peripheral edema.  +2 pedal and radial pulses bilaterally.  NEUROLOGIC: The patient is alert, awake, and oriented x3 with no focal motor or sensory deficits appreciated bilaterally.  SKIN: Moist and warm with no rashes appreciated.  Psych: Not anxious, depressed LN: No inguinal LN enlargement    Antibiotics   Anti-infectives    Start     Dose/Rate Route Frequency Ordered Stop   02/28/16 1800  doxycycline (VIBRA-TABS) tablet 100 mg     100 mg Oral Every 12 hours 02/28/16 1508     02/27/16 1545  azithromycin (ZITHROMAX) tablet 500 mg     500 mg Oral  Once 02/27/16 1542 02/27/16 1633      Medications   Scheduled Meds: . acetylcysteine  4 mL Nebulization BID  . aspirin EC  81 mg Oral Daily  . atorvastatin  40 mg Oral QHS  . divalproex  250 mg Oral BID  . doxycycline  100 mg Oral Q12H  . enoxaparin (LOVENOX) injection  40 mg Subcutaneous Q12H  . escitalopram  20 mg Oral Daily  . famotidine  20 mg Oral BID  . furosemide  20 mg Oral Daily  . guaiFENesin  600 mg Oral BID  . mouth rinse  15 mL Mouth Rinse BID  . methylPREDNISolone (SOLU-MEDROL) injection  60 mg Intravenous Q12H  . potassium  chloride  10 mEq Oral Daily  . pregabalin  200 mg Oral BID  . QUEtiapine  300 mg Oral QHS  . sodium chloride flush  3 mL Intravenous Q12H  . tiotropium  1 capsule Inhalation Daily   Continuous Infusions: PRN Meds:.sodium chloride, acetaminophen **OR** acetaminophen, albuterol, ALPRAZolam, etodolac, morphine injection, ondansetron **OR** ondansetron (ZOFRAN) IV, oxyCODONE-acetaminophen, polyethylene glycol, sodium chloride flush, tiZANidine   Data Review:   Micro Results No results found for this or any previous visit (from the past 240 hour(s)).  Radiology Reports Dg Chest 2 View  Result Date: 02/27/2016 CLINICAL DATA:  Patient with shortness of breath and cough for 1 week. EXAM: CHEST  2 VIEW COMPARISON:  Chest radiograph 10/26/2015. FINDINGS: Stable cardiomegaly. No consolidative pulmonary opacities. No pleural effusion or pneumothorax. Mid thoracic spine degenerative changes. IMPRESSION: Cardiomegaly.  No acute cardiopulmonary process. Electronically Signed   By: Lovey Newcomer M.D.   On: 02/27/2016 16:36   Ct Angio Chest Pe W Or Wo Contrast  Result Date: 02/27/2016 CLINICAL DATA:  Shortness of breath and cough for 1 week EXAM: CT ANGIOGRAPHY CHEST WITH CONTRAST TECHNIQUE: Multidetector CT imaging of the chest was performed using the standard protocol during bolus administration of intravenous contrast. Multiplanar CT image reconstructions and MIPs were obtained to evaluate the vascular anatomy. CONTRAST:  75 mL Isovue 370 intravenous COMPARISON:  Chest x-ray 01/01/ 2018, CT chest 10/27/2015 FINDINGS: Cardiovascular: No evidence for filling defect within the central or segmental pulmonary arteries to suggest an acute embolus. Left-sided aortic arch with normal 3 vessel origin. No aneurysm or dissection. Heart size upper normal. Small pericardial effusion is present. Mediastinum/Nodes: Mild mediastinal adenopathy is present. A right paratracheal lymph node, series 4, image number 18 measures 8 mm.  Additional right paratracheal lymph  node measures 12 mm in diameter, series 4, image number 27. Lobulated lymph node adjacent to the distal arch measures 9 mm, series 4, image number 28. Trachea and mainstem bronchi appear within normal limits. The imaged thyroid glands is unremarkable. Esophagus is within normal limits. Lungs/Pleura: Mild apical emphysematous changes, right greater than left are again visualized. There is no focal consolidation or pleural effusion identified. Scattered hazy densities may reflect patchy foci of atelectasis. Upper Abdomen: Enlarged fatty appearing liver. No acute abnormality. Musculoskeletal: Stable skeletal structures with degenerative changes of the spine. Multiple mid to lower thoracic spinous process/posterior element deformities, probably postsurgical. This is unchanged. Review of the MIP images confirms the above findings. IMPRESSION: 1. No CT evidence for acute pulmonary embolus or aortic dissection. 2. Small pericardial effusion 3. Mild mediastinal adenopathy, nonspecific and could be secondary to inflammation or infection. 4. Mild apical emphysematous disease Electronically Signed   By: Donavan Foil M.D.   On: 02/27/2016 18:22     CBC  Recent Labs Lab 02/27/16 1546 02/28/16 0534  WBC 7.5 7.3  HGB 13.1 12.8  HCT 37.2 36.8  PLT 163 177  MCV 90.5 90.6  MCH 31.8 31.5  MCHC 35.1 34.8  RDW 15.1* 14.7*    Chemistries   Recent Labs Lab 02/27/16 1546 02/28/16 0534  NA 139 135  K 4.1 4.7  CL 100* 98*  CO2 33* 31  GLUCOSE 114* 202*  BUN 8 10  CREATININE 1.04* 1.05*  CALCIUM 8.9 8.6*   ------------------------------------------------------------------------------------------------------------------ estimated creatinine clearance is 76.2 mL/min (by C-G formula based on SCr of 1.05 mg/dL (H)). ------------------------------------------------------------------------------------------------------------------  Recent Labs  02/28/16 0534  HGBA1C 5.7*    ------------------------------------------------------------------------------------------------------------------ No results for input(s): CHOL, HDL, LDLCALC, TRIG, CHOLHDL, LDLDIRECT in the last 72 hours. ------------------------------------------------------------------------------------------------------------------ No results for input(s): TSH, T4TOTAL, T3FREE, THYROIDAB in the last 72 hours.  Invalid input(s): FREET3 ------------------------------------------------------------------------------------------------------------------ No results for input(s): VITAMINB12, FOLATE, FERRITIN, TIBC, IRON, RETICCTPCT in the last 72 hours.  Coagulation profile No results for input(s): INR, PROTIME in the last 168 hours.  No results for input(s): DDIMER in the last 72 hours.  Cardiac Enzymes  Recent Labs Lab 02/27/16 1546  TROPONINI <0.03   ------------------------------------------------------------------------------------------------------------------ Invalid input(s): POCBNP    Assessment & Plan  Patient is a 55 year old admitted with shortness of breath   Acute copd exacerbation With acute hypoxic respiratory failure -continueIV steroids, Antibiotics with oral for acute bronchitis - Scheduled Nebulizers - Inhalers -Wean O2 as tolerated - addMucinex and Mucomyst  * Hypertension Continue therapy with Lasix  * Anxiety continue alprazolam and Seroquel  *Muscular pain due to coughing continue Percocet as needed  DVT prophylaxis with Lovenox      Code Status Orders        Start     Ordered   02/27/16 2049  Full code  Continuous     02/27/16 2049    Code Status History    Date Active Date Inactive Code Status Order ID Comments User Context   05/13/2015  4:03 AM 05/14/2015  6:08 PM Full Code [id number]  Lance Coon, MD ED   03/25/2015  9:18 PM 03/28/2015  8:48 PM Full Code [id number]  Lytle Butte, MD ED   03/02/2015  1:28 AM 03/08/2015  1:49 PM Full Code [id number]   Lytle Butte, MD ED   07/02/2014  3:15 PM 07/03/2014  5:27 PM Full Code [id number]  Loletha Grayer, MD ED           Consults  none   DVT Prophylaxis  Lovenox   Lab Results  Component Value Date   PLT 177 02/28/2016     Time Spent in minutes 81min Greater than 50% of time spent in care coordination and counseling patient regarding the condition and plan of care.   Dustin Flock M.D on 02/29/2016 at 12:51 PM  Between 7am to 6pm - Pager - [phone]  After 6pm go to [email or web address] - password [email or web address] [place] [facility]   Office  [phone]

## 2016-03-01 MED ORDER — DOXYCYCLINE HYCLATE 100 MG PO TABS
100.0000 mg | ORAL_TABLET | Freq: Two times a day (BID) | ORAL | 0 refills | Status: DC
Start: 2016-03-01 — End: 2016-03-01

## 2016-03-01 MED ORDER — GUAIFENESIN-DM 100-10 MG/5ML PO SYRP: 15.0000 mL | ORAL_SOLUTION | ORAL | 0 refills | Status: DC | PRN

## 2016-03-01 MED ORDER — PREDNISONE 10 MG (21) PO TBPK: 10.0000 mg | ORAL_TABLET | Freq: Every day | ORAL | 0 refills | Status: DC

## 2016-03-01 MED ORDER — GUAIFENESIN ER 600 MG PO TB12: 600.0000 mg | ORAL_TABLET | Freq: Two times a day (BID) | ORAL | 0 refills | Status: DC

## 2016-03-01 MED ORDER — ALBUTEROL SULFATE (2.5 MG/3ML) 0.083% IN NEBU: 2.5000 mg | INHALATION_SOLUTION | RESPIRATORY_TRACT | 2 refills | Status: DC | PRN

## 2016-03-01 MED ORDER — OXYCODONE-ACETAMINOPHEN 5-325 MG PO TABS: 1.0000 | ORAL_TABLET | Freq: Four times a day (QID) | ORAL | 0 refills | Status: DC | PRN

## 2016-03-01 MED ORDER — DOXYCYCLINE HYCLATE 100 MG PO TABS: 100.0000 mg | ORAL_TABLET | Freq: Two times a day (BID) | ORAL | 0 refills | Status: AC

## 2016-03-01 NOTE — Discharge Summary (Signed)
[name] at [facility], 56 y.o., DOB 06-Feb-1961, MRN [id number]. Admission date: 02/27/2016 Discharge Date 03/01/2016 Primary MD Enid Derry, MD Admitting Physician Hillary Bow, MD  Admission Diagnosis  COPD exacerbation [facility]) [J44.1] Right-sided chest pain [R07.9]  Discharge Diagnosis   Active Problems: Acute on chronic COPD exacerbation ([place]) Essential Hypertension anxiety Muscular pain from coughing pTSD Coronary artery disease GERD Bipolar 1 disorder         [name]  is a 56 y.o. female with a known history of COPD, hypertension, PTSD, CAD presents to the emergency room complaining of worsening shortness of breath and wheezing for 1 week. She thinks she caught a cold from her sister which slowly got worse. She has had a productive cough. She came to the emergency room with he symptoms and noted to have acute on chronic COPD exaceberation as well as acute bronchitis.  She was treated with antibiotics nebulizer and steroids . Patient Complaining of pain in her abdomen  Due to coughing. Which was treated with pain medications. Her breathing is much improved. She is stable for discharge to home.            Consults  None  Significant Tests:  See full reports for all details     Dg Chest 2 View  Result Date: 02/27/2016 CLINICAL DATA:  Patient with shortness of breath and cough for 1 week. EXAM: CHEST  2 VIEW COMPARISON:  Chest radiograph 10/26/2015. FINDINGS: Stable cardiomegaly. No consolidative pulmonary opacities. No pleural effusion or pneumothorax. Mid thoracic spine degenerative changes. IMPRESSION: Cardiomegaly.  No acute cardiopulmonary process. Electronically Signed   By: Lovey Newcomer M.D.   On: 02/27/2016 16:36   Ct Angio Chest Pe W Or Wo Contrast  Result Date: 02/27/2016 CLINICAL DATA:  Shortness of breath and cough for 1 week EXAM: CT ANGIOGRAPHY CHEST WITH CONTRAST TECHNIQUE: Multidetector  CT imaging of the chest was performed using the standard protocol during bolus administration of intravenous contrast. Multiplanar CT image reconstructions and MIPs were obtained to evaluate the vascular anatomy. CONTRAST:  75 mL Isovue 370 intravenous COMPARISON:  Chest x-ray 01/01/ 2018, CT chest 10/27/2015 FINDINGS: Cardiovascular: No evidence for filling defect within the central or segmental pulmonary arteries to suggest an acute embolus. Left-sided aortic arch with normal 3 vessel origin. No aneurysm or dissection. Heart size upper normal. Small pericardial effusion is present. Mediastinum/Nodes: Mild mediastinal adenopathy is present. A right paratracheal lymph node, series 4, image number 18 measures 8 mm. Additional right paratracheal lymph node measures 12 mm in diameter, series 4, image number 27. Lobulated lymph node adjacent to the distal arch measures 9 mm, series 4, image number 28. Trachea and mainstem bronchi appear within normal limits. The imaged thyroid glands is unremarkable. Esophagus is within normal limits. Lungs/Pleura: Mild apical emphysematous changes, right greater than left are again visualized. There is no focal consolidation or pleural effusion identified. Scattered hazy densities may reflect patchy foci of atelectasis. Upper Abdomen: Enlarged fatty appearing liver. No acute abnormality. Musculoskeletal: Stable skeletal structures with degenerative changes of the spine. Multiple mid to lower thoracic spinous process/posterior element deformities, probably postsurgical. This is unchanged. Review of the MIP images confirms the above findings. IMPRESSION: 1. No CT evidence for acute pulmonary embolus or aortic dissection. 2. Small pericardial effusion 3. Mild mediastinal adenopathy, nonspecific and could be secondary to inflammation or infection. 4. Mild apical emphysematous disease Electronically Signed   By: Madie Reno.D.  On: 02/27/2016 18:22       Today   Subjective:    Nicole Austin  Feeling better shortness of breath  Objective:   Blood pressure (!) 103/57, pulse 68, temperature 97.6 F (36.4 C), temperature source Oral, resp. rate 18, height 5' 2"  (1.575 m), weight 281 lb 1.6 oz (127.5 kg), SpO2 93 %.  .  Intake/Output Summary (Last 24 hours) at 03/01/16 1356 Last data filed at 03/01/16 0900  Gross per 24 hour  Intake              360 ml  Output                0 ml  Net              360 ml    Exam VITAL SIGNS: Blood pressure (!) 103/57, pulse 68, temperature 97.6 F (36.4 C), temperature source Oral, resp. rate 18, height 5' 2"  (1.575 m), weight 281 lb 1.6 oz (127.5 kg), SpO2 93 %.  GENERAL:  56 y.o.-year-old patient lying in the bed with no acute distress.  EYES: Pupils equal, round, reactive to light and accommodation. No scleral icterus. Extraocular muscles intact.  HEENT: Head atraumatic, normocephalic. Oropharynx and nasopharynx clear.  NECK:  Supple, no jugular venous distention. No thyroid enlargement, no tenderness.  LUNGS: Normal breath sounds bilaterally, no wheezing, rales,rhonchi or crepitation. No use of accessory muscles of respiration.  CARDIOVASCULAR: S1, S2 normal. No murmurs, rubs, or gallops.  ABDOMEN: Soft, nontender, nondistended. Bowel sounds present. No organomegaly or mass.  EXTREMITIES: No pedal edema, cyanosis, or clubbing.  NEUROLOGIC: Cranial nerves II through XII are intact. Muscle strength 5/5 in all extremities. Sensation intact. Gait not checked.  PSYCHIATRIC: The patient is alert and oriented x 3.  SKIN: No obvious rash, lesion, or ulcer.   Data Review     CBC w Diff: Lab Results  Component Value Date   WBC 7.3 02/28/2016   HGB 12.8 02/28/2016   HGB 10.9 (L) 06/15/2014   HCT 36.8 02/28/2016   HCT 32.9 (L) 06/15/2014   PLT 177 02/28/2016   PLT 219 06/15/2014   LYMPHOPCT 28 10/06/2015   LYMPHOPCT 21.1 06/15/2014   MONOPCT 9 10/06/2015   MONOPCT 10.1 06/15/2014   EOSPCT 9 10/06/2015   EOSPCT 3.0  06/15/2014   BASOPCT 1 10/06/2015   BASOPCT 0.6 06/15/2014   CMP: Lab Results  Component Value Date   NA 135 02/28/2016   NA 136 06/15/2014   K 4.7 02/28/2016   K 3.3 (L) 06/16/2014   CL 98 (L) 02/28/2016   CL 105 06/15/2014   CO2 31 02/28/2016   CO2 24 06/15/2014   BUN 10 02/28/2016   BUN 12 06/15/2014   CREATININE 1.05 (H) 02/28/2016   CREATININE 1.13 (H) 10/04/2015   PROT 6.2 10/04/2015   PROT 5.5 (L) 06/15/2014   ALBUMIN 3.8 10/04/2015   ALBUMIN 2.6 (L) 06/15/2014   BILITOT 0.4 10/04/2015   BILITOT 0.7 06/15/2014   ALKPHOS 80 10/04/2015   ALKPHOS 73 06/15/2014   AST 24 10/04/2015   AST 40 06/15/2014   ALT 19 10/04/2015   ALT 32 06/15/2014  .  Micro Results No results found for this or any previous visit (from the past 240 hour(s)).      Code Status Orders        Start     Ordered   02/27/16 2049  Full code  Continuous     02/27/16 2049    Code  Status History    Date Active Date Inactive Code Status Order ID Comments User Context   05/13/2015  4:03 AM 05/14/2015  6:08 PM Full Code [id number]  Lance Coon, MD ED   03/25/2015  9:18 PM 03/28/2015  8:48 PM Full Code [id number]  Lytle Butte, MD ED   03/02/2015  1:28 AM 03/08/2015  1:49 PM Full Code [id number]  Lytle Butte, MD ED   07/02/2014  3:15 PM 07/03/2014  5:27 PM Full Code [id number]  Loletha Grayer, MD ED            Discharge Medications   Allergies as of 03/01/2016      Reactions   Chantix [varenicline] Nausea Only   Codeine Itching      Medication List    TAKE these medications   albuterol 108 (90 Base) MCG/ACT inhaler Commonly known as:  PROVENTIL HFA;VENTOLIN HFA Inhale 2 puffs into the lungs every 6 (six) hours as needed for wheezing or shortness of breath.   albuterol (2.5 MG/3ML) 0.083% nebulizer solution Commonly known as:  PROVENTIL Take 3 mLs (2.5 mg total) by nebulization every 4 (four) hours as needed for wheezing or shortness of breath.   ALPRAZolam 0.5 MG tablet Commonly known  as:  XANAX Take 1 tablet (0.5 mg total) by mouth 2 (two) times daily as needed for anxiety.   aspirin EC 81 MG tablet Take 81 mg by mouth daily.   atorvastatin 40 MG tablet Commonly known as:  LIPITOR Take 1 tablet (40 mg total) by mouth at bedtime.   divalproex 250 MG DR tablet Commonly known as:  DEPAKOTE Take 1 tablet (250 mg total) by mouth 2 (two) times daily.   doxycycline 100 MG tablet Commonly known as:  VIBRA-TABS Take 1 tablet (100 mg total) by mouth 2 (two) times daily.   escitalopram 20 MG tablet Commonly known as:  LEXAPRO Take 1 tablet (20 mg total) by mouth daily.   etodolac 200 MG capsule Commonly known as:  LODINE TAKE 1 CAPSULE BY MOUTH EVERY 8 HOURS ASNEEDED FOR PAIN. TAKE WITH FOOD What changed:  See the new instructions.   furosemide 20 MG tablet Commonly known as:  LASIX Take 1 tablet (20 mg total) by mouth daily.   guaiFENesin 600 MG 12 hr tablet Commonly known as:  MUCINEX Take 1 tablet (600 mg total) by mouth 2 (two) times daily.   guaiFENesin-dextromethorphan 100-10 MG/5ML syrup Commonly known as:  ROBITUSSIN DM Take 15 mLs by mouth every 4 (four) hours as needed for cough.   lidocaine 5 % ointment Commonly known as:  XYLOCAINE Apply 1 application topically as needed for mild pain.   LYRICA 200 MG capsule Generic drug:  pregabalin Take 1 capsule (200 mg total) by mouth 2 (two) times daily.   multivitamin with minerals Tabs tablet Take 1 tablet by mouth daily.   Nebulizer Compressor Kit One nebulizer kit/machine with tubing; for use with nebulized albuterol   oxyCODONE-acetaminophen 5-325 MG tablet Commonly known as:  PERCOCET/ROXICET Take 1-2 tablets by mouth every 6 (six) hours as needed for moderate pain.   potassium chloride 10 MEQ tablet Commonly known as:  KLOR-CON 10 One by mouth daily   predniSONE 10 MG (21) Tbpk tablet Commonly known as:  STERAPRED UNI-PAK 21 TAB Take 1 tablet (10 mg total) by mouth daily. Start at 53m  taper by 163m Until complete   QUEtiapine 300 MG 24 hr tablet Commonly known as:  SEROQUEL XR Take 1 tablet (300  mg total) by mouth at bedtime.   ranitidine 150 MG tablet Commonly known as:  ZANTAC Take 1 tablet (150 mg total) by mouth 2 (two) times daily.   Tiotropium Bromide Monohydrate 2.5 MCG/ACT Aers Commonly known as:  SPIRIVA RESPIMAT Inhale 2 puffs into the lungs daily.   tiZANidine 4 MG tablet Commonly known as:  ZANAFLEX Take 4 mg by mouth every 8 (eight) hours as needed. Take 4 mg every 8 hours as needed.            Durable Medical Equipment        Start     Ordered   03/01/16 1205  For home use only DME Nebulizer machine  Once    Question:  Patient needs a nebulizer to treat with the following condition  Answer:  COPD (chronic obstructive pulmonary disease) ([place])   03/01/16 1204         Total Time in preparing paper work, data evaluation and todays exam - 3 minutes  Dustin Flock M.D on 03/01/2016 at 1:56 PM  [facility]   Office  [phone]

## 2016-03-01 NOTE — Progress Notes (Signed)
Discharge paperwork reviewed with patient who verbalized understanding. New prescriptions reviewed and educated how to take. Patient is stable for discharge. Friend to transport home.

## 2016-03-01 NOTE — Care Management (Addendum)
Nebulizer requested for this patient per [facility] nurse evaluation. Corene Cornea with [facility] will deliver prior to discharge. Message sent to Dr. Posey Pronto with this request.

## 2016-03-01 NOTE — Discharge Instructions (Signed)
Chronic Obstructive Pulmonary Disease Chronic obstructive pulmonary disease (COPD) is a common lung problem. In COPD, the flow of air from the lungs is limited. The way your lungs work will probably never return to normal, but there are things you can do to improve your lungs and make yourself feel better. Your doctor may treat your condition with:  Medicines.  Oxygen.  Lung surgery.  Changes to your diet.  Rehabilitation. This may involve a team of specialists. Follow these instructions at home:  Take all medicines as told by your doctor.  Avoid medicines or cough syrups that dry up your airway (such as antihistamines) and do not allow you to get rid of thick spit. You do not need to avoid them if told differently by your doctor.  If you smoke, stop. Smoking makes the problem worse.  Avoid being around things that make your breathing worse (like smoke, chemicals, and fumes).  Use oxygen therapy and therapy to help improve your lungs (pulmonary rehabilitation) if told by your doctor. If you need home oxygen therapy, ask your doctor if you should buy a tool to measure your oxygen level (oximeter).  Avoid people who have a sickness you can catch (contagious).  Avoid going outside when it is very hot, cold, or humid.  Eat healthy foods. Eat smaller meals more often. Rest before meals.  Stay active, but remember to also rest.  Make sure to get all the shots (vaccines) your doctor recommends. Ask your doctor if you need a pneumonia shot.  Learn and use tips on how to relax.  Learn and use tips on how to control your breathing as told by your doctor. Try: 1. Breathing in (inhaling) through your nose for 1 second. Then, pucker your lips and breath out (exhale) through your lips for 2 seconds. 2. Putting one hand on your belly (abdomen). Breathe in slowly through your nose for 1 second. Your hand on your belly should move out. Pucker your lips and breathe out slowly through your lips.  Your hand on your belly should move in as you breathe out.  Learn and use controlled coughing to clear thick spit from your lungs. The steps are: 1. Lean your head a little forward. 2. Breathe in deeply. 3. Try to hold your breath for 3 seconds. 4. Keep your mouth slightly open while coughing 2 times. 5. Spit any thick spit out into a tissue. 6. Rest and do the steps again 1 or 2 times as needed. Contact a doctor if:  You cough up more thick spit than usual.  There is a change in the color or thickness of the spit.  It is harder to breathe than usual.  Your breathing is faster than usual. Get help right away if:  You have shortness of breath while resting.  You have shortness of breath that stops you from:  Being able to talk.  Doing normal activities.  You chest hurts for longer than 5 minutes.  Your skin color is more blue than usual.  Your pulse oximeter shows that you have low oxygen for longer than 5 minutes. This information is not intended to replace advice given to you by your health care provider. Make sure you discuss any questions you have with your health care provider. Document Released: 08/01/2007 Document Revised: 07/21/2015 Document Reviewed: 10/09/2012 Elsevier Interactive Patient Education  2017 Elsevier Inc.  

## 2016-03-01 NOTE — Progress Notes (Signed)
Patient is requesting to go outside for a walk. I explained I preferred patient stay on the unit due to snow/ice outside, to taking pain medication frequently and so she is close by if she were to get short of breath. Patient verbalized understanding.

## 2016-03-01 NOTE — Progress Notes (Signed)
Nicole Austin was admitted to the [facility] on 02/27/2016 and Discharged  03/01/2016 and should be excused from work/school   for 4 days starting 02/27/2016 , may return to work/school without any restrictions.  Call Dustin Flock MD with questions.  Dustin Flock M.D on 03/01/2016,at 12:12 PM  [place] at [facility]  [phone]

## 2016-03-01 NOTE — Care Management (Signed)
Discharge to home today per Dr. Posey Pronto. Doesn't qualify for home oxygen. Request for nebulizer sent to [facility] per text message. Friend will transport. Shelbie Ammons RN MSN CCM Care Management

## 2016-03-02 ENCOUNTER — Other Ambulatory Visit: Payer: Self-pay | Admitting: *Deleted

## 2016-03-02 NOTE — Patient Outreach (Signed)
Attempt made to contact pt for transition of care, follow up on recent hospitalization 1/1-1/4 COPD exacerbation, right sided chest pain.     Unable to leave a voice message on pt's phone as phone kept ringing.   HIPAA  Compliant voice message left  on pt's mobile phone- RN CM's contact name and number.    Plan:  If no response, plan to follow up again telephonically in 3 days (next business day).     Zara Chess.   [place] Care Management  [phone]

## 2016-03-05 ENCOUNTER — Other Ambulatory Visit: Payer: Self-pay | Admitting: Pharmacist

## 2016-03-05 ENCOUNTER — Other Ambulatory Visit: Payer: Self-pay | Admitting: *Deleted

## 2016-03-05 NOTE — Patient Outreach (Signed)
[facility]) Care Management  03/05/2016  Nicole Austin December 22, 1960 [id number]   56 year old female referred to [place] for medication management/polypharmacy. Per referral Patient reports being on approximately 19 medications. Per chart review patient was on Chantix for 30 days but unable to tolerate due to side effects and would like more assistance with smoking cessation.  Called patient today to discuss medications but patient states that she is visiting her sister in the hospital and today is not a good time to call.  She requests I call her back later this week.   Plan: Followup via telephone to assess medication management/adherence in 2-3 days  Bennye Alm, PharmD, [place] PGY2 Pharmacy Resident [phone]

## 2016-03-05 NOTE — Patient Outreach (Signed)
Second attempt made to contact pt for transition of care, follow up on recent hospitalization 1/1-1/4 COPD exacerbation, right sided chest pain.   HIPAA compliant voice message left with contact name and number.    Plan: If no response, plan to follow up again tomorrow telephonically  as part of ongoing transition of care.    Zara Chess.   [place] Care Management  [phone]

## 2016-03-06 ENCOUNTER — Telehealth: Payer: Self-pay | Admitting: Family Medicine

## 2016-03-06 ENCOUNTER — Other Ambulatory Visit: Payer: Self-pay | Admitting: *Deleted

## 2016-03-06 ENCOUNTER — Ambulatory Visit: Payer: Medicare HMO | Admitting: Licensed Clinical Social Worker

## 2016-03-06 NOTE — Patient Outreach (Signed)
Another attempt (third) made to contact pt for transition of care, follow up on recent hospitalization 1/1-1/4 COPD exacerbation, right sided chest pain.   HIPAA compliant voice messages left with RN CM's contact name and number on both pt's  Home  and mobile phones.    Plan:  If no response, plan pt to follow up next week for scheduled home visit (arranged prior to recent hospitalization).    Zara Chess.   [place] Care Management  [phone]

## 2016-03-06 NOTE — Patient Outreach (Signed)
Follow up phone call.  Spoke with pt, HIPAA/identity verified.  RN CM informed pt MD office will contact her for scheduled appointment, request was made to make earlier than 1/22.  Also discussed with pt  when MD office calls to ask  MD if okay to restart Prednisone (missed 4 days) to which pt said she would.      Plan:  RN CM to follow up with pt next week- home visit.   Nicole Austin.   [place] Care Management  [phone]

## 2016-03-06 NOTE — Patient Outreach (Signed)
Return phone call successful, follow up on voice message received from pt to  message left earlier by RN CM.  Spoke with pt, HIPAA/identity verified, discussed purpose of call to follow up on recent hospitalization (1/1-1/4 COPD exacerbation,right sided chest pain, bronchitis).   Pt reports been  at sister's for a week (since discharge) but then sister  got sick/ went to the hospital yesterday,home now.  Pt reports both she and sister are taking care of each other.  Pt reports she did receive her nebulizer machine from the hospital when discharged, machine at her house, been using her sister's twice a day, helps some.   Pt reports she has not scheduled follow up appointment with Dr. Sanda Klein yet, plan to do so to which RN CM offered to call, pt agreed.   Patient was recently discharged from hospital and all medications have been reviewed.  Pt reports she took the first two of her Prednisone,forgot to bring it from home, missed 4 days now.   RN CM discussed with pt importance of medication adherence, to have Depakote refilled.    Plan:  As discussed with pt, plan to follow up again next week- scheduled home visit.             RN CM to call Dr. Delight Ovens office, schedule pt's post hospital follow up appointment.               Zara Chess.   [place] Care Management  [phone]

## 2016-03-06 NOTE — Patient Outreach (Signed)
4:26 pm- called Dr. Delight Ovens office, requested post hospital follow up appointment for pt with Dr. Sanda Klein, pt discharged 1/4 (COPD exacerbation).   Was informed next appointment available is 1/22 to which RN CM requested earlier one if possible with another MD for pt.   Was informed office would contact pt to schedule appointment.   Plan:  RN CM to inform pt that  MD office will contact her to schedule appointment.    Zara Chess.   [place] Care Management  [phone]

## 2016-03-06 NOTE — Telephone Encounter (Signed)
[place] Freight forwarder for [facility] would like to schedule an hospital follow up. patient was discharged on 03/01/16. I tried putting her on your scheduled for 03/19/16 but Kalman Shan stated pt need to be seen sooner. Please contact patient to schedule appointment and please advise as to where to place her. She was in the hospital for COPD with excerebration.

## 2016-03-07 ENCOUNTER — Other Ambulatory Visit: Payer: Self-pay | Admitting: Psychiatry

## 2016-03-07 ENCOUNTER — Other Ambulatory Visit: Payer: Self-pay | Admitting: *Deleted

## 2016-03-07 NOTE — Telephone Encounter (Signed)
Patient scheduled for 1st available 1/22, notified

## 2016-03-07 NOTE — Patient Outreach (Signed)
3:51 pm-  Received a return phone call from pt, reports on recent medication refills- Depakote was refilled yesterday, today refill for Seroquel and Zanaflex called in.   Pt confirmed received a call from MD office, to follow up with MD 1/22.   Pt reports she did not talk to MD about restarting Prednisone, went home and could not find the medication.   Pt reports breathing is better, did not do a treatment yesterday or today.  Pt reports she now has her own machine at sisters.  RN CM discussed with pt adherence with using  nebulizer treatments as needed to which pt voiced understanding.     Plan:  As discussed with pt, plan to follow up again next week- home visit (part of ongoing transition of care).  Zara Chess.   [place] Care Management  [phone]

## 2016-03-07 NOTE — Patient Outreach (Addendum)
Attempt made to contact pt, inquire if pt talked to Dr. Sanda Klein about restarting Prednisone.   HIPAA compliant voice message left with contact name and number.   Plan:  If no response, plan to try again tomorrow.  Zara Chess.   [place] Care Management  [phone]

## 2016-03-09 ENCOUNTER — Other Ambulatory Visit: Payer: Self-pay | Admitting: Pharmacist

## 2016-03-09 ENCOUNTER — Ambulatory Visit: Payer: Self-pay | Admitting: Pharmacist

## 2016-03-09 NOTE — Patient Outreach (Signed)
[facility]) Care Management  03/09/2016  Nicole [name] Austin 12-06-1960 [id number]   56 year old female referred to [place] for medication management/polypharmacy. Per referral Patient reports being on approximately 19 medications. Per chart review patient was on Chantix for 30 days but unable to tolerate due to side effects and would like more assistance with smoking cessation.  Called patient today to followup medications but was unable to reach her via telephone.  I have left a HIPAA compliant voicemail asking her to return my call.  Plan:  Will followup via telephone next week.  Nicole Austin, PharmD, [place] PGY2 Pharmacy Resident [phone]

## 2016-03-12 ENCOUNTER — Ambulatory Visit: Payer: Medicare HMO | Admitting: Licensed Clinical Social Worker

## 2016-03-13 ENCOUNTER — Other Ambulatory Visit: Payer: Self-pay | Admitting: Pharmacist

## 2016-03-13 ENCOUNTER — Ambulatory Visit: Payer: Self-pay | Admitting: Pharmacist

## 2016-03-13 ENCOUNTER — Other Ambulatory Visit: Payer: Self-pay | Admitting: *Deleted

## 2016-03-13 NOTE — Patient Outreach (Signed)
[facility]) Care Management  03/13/2016  Zyanah Brightwell Kenisha Lynds [birth year] [id number]   56 year old female referred to [place] for medication management/polypharmacy. Per referral Patient reports being on approximately 19 medications. Per chart review patient was on Chantix for 30 days but unable to tolerate due to side effects and would like more assistance with smoking cessation.  Called patient today to followup medications but was unable to reach her via telephone.  I have left a HIPAA compliant voicemail asking her to return my call. (unsuccessful outreach attempt #2)   Plan: Will followup via telephone tomorrow  Bennye Alm, PharmD, [place] PGY2 Pharmacy Resident [phone]

## 2016-03-13 NOTE — Patient Outreach (Signed)
[facility]) Care Management  03/13/2016  Jason Herbst Bennison [birth year] [id number]   Arrived at pt's home for scheduled home visit (part of transition of care), knocked on door two separate times, no response.  Attempt made to contact by phone, HIPAA compliant voice message left with contact number.    Plan:  If no response, plan to follow up again with pt  telephonically within next 3 days.      Zara Chess.   [place] Care Management  [phone]

## 2016-03-14 ENCOUNTER — Ambulatory Visit: Payer: Self-pay | Admitting: Pharmacist

## 2016-03-14 ENCOUNTER — Other Ambulatory Visit: Payer: Self-pay | Admitting: Pharmacist

## 2016-03-14 NOTE — Patient Outreach (Signed)
[facility]) Care Management  03/14/2016  Nicole [name] Austin Jul 01, 1960 [id number]   56 year old female referred to [place] for medication management/polypharmacy. Per referral Patient reports being on approximately 19 medications. Per chart review patient was on Chantix for 30 days but unable to tolerate due to side effects and would like more assistance with smoking cessation. Called patient today to followup medications but was unable to reach her via telephone. I have left a HIPAA compliant voicemail asking her to return my call. (unsuccessful outreach attempt #3)  Plan: Will send unsuccessful outreach attempt letter and will allow 10 days for patient to respond prior to closing [place] case. Will contact [facility] RN who has previously contacted patient to see if she is able to ask patient to contact [place], PharmD, [place] PGY2 Pharmacy Resident [phone]

## 2016-03-16 ENCOUNTER — Other Ambulatory Visit: Payer: Self-pay | Admitting: *Deleted

## 2016-03-16 ENCOUNTER — Ambulatory Visit: Payer: Self-pay | Admitting: *Deleted

## 2016-03-16 NOTE — Patient Outreach (Signed)
Attempt made to contact pt as part of ongoing transition of care- recent hospitalization 1/1-1/4 COPD exacerbation, right sided chest pain, bronchitis.   HIPAA compliant voice message left with contact name and number.    Plan: if no response, plan to follow up again next week telephonically (part of ongoing transition of care).    Zara Chess.   [place] Care Management  [phone]

## 2016-03-19 ENCOUNTER — Ambulatory Visit: Payer: Self-pay | Admitting: Family Medicine

## 2016-03-21 ENCOUNTER — Other Ambulatory Visit: Payer: Self-pay | Admitting: *Deleted

## 2016-03-21 NOTE — Patient Outreach (Signed)
11:24 am-  Received a return phone call from pt to  transition of care call made earlier by RN CM- ongoing follow up on  recent hospitalization 1/1-1/4 COPD exacerbation, bronchitis.   Spoke with pt, HIPAA verified, pt reports been staying at [facility] as sister went into the hospital for 5 days (caring for sister).   Pt reports she is tired, wants to go home, plan is 1/26.  Pt reports she is doing her nebulizer treatments twice a day, still has a lot of congestion/little cough, treatments helping, doing better.  RN CM discussed with pt ongoing use of nebulizer treatments as needed, use of incentive spirometer, hydration to which pt voiced understanding.  Pt reports she has an appointment to follow up with Dr. Sanda Klein 1/30- been having female  problems/cramping.   Pt reports appetite is good, gained a pound, current weight 271 lbs.  Pt reports she cannot quit smoking.    Plan:  As discussed with pt, plan to follow up again next week- home visit.    Zara Chess.   [place] Care Management  [phone]

## 2016-03-21 NOTE — Patient Outreach (Signed)
Third attempt made to contact pt as part of ongoing transition of care (recent hospitalization 1/1-1/4 for COPD exacerbation, bronchitis.   Home number called- message phone disconnected/not in service.   Called mobile number, HIPAA compliant voice message left with contact name and number.     Plan: If no response, plan to send unable to contact letter.     Zara Chess.   [place] Care Management  [phone]

## 2016-03-23 ENCOUNTER — Telehealth: Payer: Self-pay

## 2016-03-23 DIAGNOSIS — L89629 Pressure ulcer of left heel, unspecified stage: Secondary | ICD-10-CM | POA: Diagnosis not present

## 2016-03-23 NOTE — Telephone Encounter (Signed)
pt called states she needs a refill on her medications. pt will not have enough to last until next  appt

## 2016-03-24 ENCOUNTER — Other Ambulatory Visit: Payer: Self-pay | Admitting: Family Medicine

## 2016-03-24 DIAGNOSIS — G894 Chronic pain syndrome: Secondary | ICD-10-CM

## 2016-03-26 NOTE — Telephone Encounter (Signed)
left message on doctor's line to refill medications lexapri. seroquel and xanax. with no additional refills.

## 2016-03-26 NOTE — Telephone Encounter (Signed)
noted 

## 2016-03-27 ENCOUNTER — Ambulatory Visit: Payer: Self-pay | Admitting: Family Medicine

## 2016-03-27 ENCOUNTER — Other Ambulatory Visit: Payer: Self-pay | Admitting: Pharmacist

## 2016-03-28 NOTE — Patient Outreach (Signed)
[facility]) Care Management  03/28/2016  Nicole [name] Austin [birth year] [id number]   56 year old female referred to [place] for medication management/polypharmacy. Per referral Patient reports being on approximately 19 medications. Per chart review patient was on Chantix for 30 days but unable to tolerate due to side effects and would like more assistance with smoking cessation.  [facility] pharmacy has been unable to reach patient after 3 outreach attempts and sending an unsuccessful outreach letter.  Plan: [place] will close case after unsuccessful outreach attempts. Will notify [facility] RN and ask to reopen case if patient would like [place] involvement.  Bennye Alm, PharmD, [place] PGY2 Pharmacy Resident [phone]

## 2016-03-29 ENCOUNTER — Other Ambulatory Visit: Payer: Self-pay | Admitting: *Deleted

## 2016-03-29 ENCOUNTER — Ambulatory Visit: Payer: Self-pay | Admitting: *Deleted

## 2016-03-29 ENCOUNTER — Ambulatory Visit: Payer: Commercial Managed Care - HMO | Admitting: *Deleted

## 2016-03-29 NOTE — Patient Outreach (Signed)
Received a call from pt cancelling today's home visit, HIPAA/identity verified by pt.    Pt reports needs to reschedule, not a good day, still with sister. This RN CM following pt for transition of care, recent hospitalization 1/1-1/4 COPD exacerbation, right sides chest pain.     Pt reports following up with Dr. Sanda Klein 2/5, still getting sob, no better or no worse.  Pt reports continues with nebulizer treatments, mucous yellow, taking all of her medications.  Pt reports no change in her smoking, does want to quit to which RN CM  discussed  [facility] pharmacy's attempts to contact her, voice messages left with no responses.  Pt reports she still interested in [facility] pharmacist following up with her on her smoking, to make sure to call mobile  number [phone] and she will answer it.     Plan:  As discussed with pt, plan to follow up again next week- home visit (part of ongoing transition of care).      Zara Chess.   [place] Care Management  [phone]

## 2016-04-02 ENCOUNTER — Ambulatory Visit (INDEPENDENT_AMBULATORY_CARE_PROVIDER_SITE_OTHER): Payer: Medicare HMO | Admitting: Family Medicine

## 2016-04-02 ENCOUNTER — Encounter: Payer: Self-pay | Admitting: Family Medicine

## 2016-04-02 VITALS — BP 132/70 | HR 100 | Temp 97.8°F | Resp 18 | Wt 273.0 lb

## 2016-04-02 DIAGNOSIS — M25561 Pain in right knee: Secondary | ICD-10-CM

## 2016-04-02 DIAGNOSIS — Z1239 Encounter for other screening for malignant neoplasm of breast: Secondary | ICD-10-CM

## 2016-04-02 DIAGNOSIS — Z1231 Encounter for screening mammogram for malignant neoplasm of breast: Secondary | ICD-10-CM | POA: Diagnosis not present

## 2016-04-02 DIAGNOSIS — J449 Chronic obstructive pulmonary disease, unspecified: Secondary | ICD-10-CM | POA: Diagnosis not present

## 2016-04-02 DIAGNOSIS — G8929 Other chronic pain: Secondary | ICD-10-CM | POA: Diagnosis not present

## 2016-04-02 NOTE — Patient Instructions (Addendum)
I do encourage you to quit smoking Call [phone] to sign up for smoking cessation classes You can call 1-800-QUIT-NOW to talk with a smoking cessation coach  We'll have you see the nutritionist Return in 2-3 weeks and bring in a list of everything you have eaten for five days, with estimated portion sizes Drink 64 ounces of water or other calorie-free and caffeine-free beverage every day

## 2016-04-02 NOTE — Assessment & Plan Note (Signed)
Patient did not hear about last referral from May 2017; will refer to pulmonologist; patient unfortunately still smokes; I am here to help her quit if/when ready

## 2016-04-02 NOTE — Progress Notes (Signed)
BP 132/70   Pulse 100   Temp 97.8 F (36.6 C) (Oral)   Resp 18   Wt 273 lb (123.8 kg)   LMP  (LMP Unknown)   SpO2 92%   BMI 49.93 kg/m    Subjective:    Patient ID: Nicole Austin, female    DOB: 04/08/1960, 56 y.o.   MRN: [id number]  HPI: [name] is a 56 y.o. female  Chief Complaint  Patient presents with  . Hospitalization Follow-up    COPD   Patient is tired, thinks she is getting a cold; she was in the hospital the first of January; COPD exacerbation and was admitted Jan 1st and discharged on Jan 4th; d/c summary reviewed; breathing is not good now; she gets so out of breath so fast; not seeing a pulmonologist  Patient says she was taking care of her sister; was diagnosed with viral and bacterial meningitis; this is the third week; sister was in the hospital / ICU for five days; no spleen; patient was directly taking care of her during this time; she was in her room and hugged her, kissed her on the forehead; they told her after that to wear a mask and gown and gloves; no neck stiffness; she had a headache the other day and took excedrin PM and then little one day after, then no more headaches; no hallucinations or confusion  She is 273 pounds today; she doesn't know why she weighs this much; she is getting "exercise"; walking around the house, letting the dog out; busy all the time; takes her fiance to work Thyroid tests done in August 2017 We discussed eating; for breakfast today, she had sausage and eggs and toast Dinner last night, [place] beef, white rice, did not eat the whole portion [employer] (regular, not diet)  She is having pain in her leg; toothache; hurts down to the bone; right knee; I put in a referral to Dr. Rogers Blocker on January 05, 2016; needs to see someone else  Depression screen [facility] 2/9 04/02/2016 01/10/2016 01/05/2016 12/26/2015 12/13/2015  Decreased Interest 0 2 0 1 0  Down, Depressed, Hopeless 1 - 1 0 0  PHQ - 2 Score 1  2 1 1  0  Altered sleeping - 1 - - -  Tired, decreased energy - 1 - - -  Change in appetite - 2 - - -  Feeling bad or failure about yourself  - 3 - - -  Trouble concentrating - - - - -  Moving slowly or fidgety/restless - 3 - - -  Suicidal thoughts - 0 - - -  PHQ-9 Score - 12 - - -  Difficult doing work/chores - Very difficult - - -  Some recent data might be hidden   Relevant past medical, surgical, family and social history reviewed Past Medical History:  Diagnosis Date  . ADHD (attention deficit hyperactivity disorder)   . Arthritis   . Asthma   . Bipolar 1 disorder ([place])   . COPD (chronic obstructive pulmonary disease) ([place])   . Depression   . GERD (gastroesophageal reflux disease) 08/30/2015  . Low HDL (under 40) 08/30/2015  . MI (myocardial infarction)   . PTSD (post-traumatic stress disorder)   . Rhabdomyolysis   . Stroke [facility])    Past Surgical History:  Procedure Laterality Date  . BACK SURGERY    . REPLACEMENT TOTAL KNEE Left    Family History  Problem Relation Age of Onset  . Hernia Mother   .  Heart disease Mother   . OCD Mother   . Diabetes Mother   . Parkinson's disease Father   . Bipolar disorder Sister   . Schizophrenia Sister   . ADD / ADHD Sister   . Alcohol abuse Brother   . Bipolar disorder Sister   . Paranoid behavior Sister   . ADD / ADHD Sister    Social History  Substance Use Topics  . Smoking status: Current Every Day Smoker    Packs/day: 1.00    Years: 36.00    Types: Cigarettes    Start date: 10/11/1984  . Smokeless tobacco: Never Used  . Alcohol use No   Interim medical history since last visit reviewed. Allergies and medications reviewed  Review of Systems Per HPI unless specifically indicated above     Objective:    BP 132/70   Pulse 100   Temp 97.8 F (36.6 C) (Oral)   Resp 18   Wt 273 lb (123.8 kg)   LMP  (LMP Unknown)   SpO2 92%   BMI 49.93 kg/m   Wt Readings from Last 3 Encounters:  04/03/16 273 lb (123.8 kg)    04/02/16 273 lb (123.8 kg)  03/01/16 281 lb 1.6 oz (127.5 kg)    Physical Exam  Constitutional: She appears well-developed and well-nourished. No distress.  Eyes: EOM are normal. No scleral icterus.  Neck: No thyromegaly present.  Cardiovascular: Normal rate.   Pulmonary/Chest: Effort normal. She has wheezes (opening low-pitched at beginning of inspiration).  Abdominal: She exhibits no distension.  Musculoskeletal:       Right shoulder: She exhibits decreased range of motion.       Right knee: She exhibits decreased range of motion. She exhibits no effusion, no deformity and no erythema. Tenderness found. Medial joint line and lateral joint line tenderness noted. No patellar tendon tenderness noted.  Skin: No pallor.  Psychiatric: She has a normal mood and affect. Her behavior is normal. Judgment and thought content normal.  Briefly tearful when discussing her weight gain; frustration with her obesity   Results for orders placed or performed during the hospital encounter of 02/27/16  CBC  Result Value Ref Range   WBC 7.5 3.6 - 11.0 K/uL   RBC 4.11 3.80 - 5.20 MIL/uL   Hemoglobin 13.1 12.0 - 16.0 g/dL   HCT 37.2 35.0 - 47.0 %   MCV 90.5 80.0 - 100.0 fL   MCH 31.8 26.0 - 34.0 pg   MCHC 35.1 32.0 - 36.0 g/dL   RDW 15.1 (H) 11.5 - 14.5 %   Platelets 163 150 - 440 K/uL  Basic metabolic panel  Result Value Ref Range   Sodium 139 135 - 145 mmol/L   Potassium 4.1 3.5 - 5.1 mmol/L   Chloride 100 (L) 101 - 111 mmol/L   CO2 33 (H) 22 - 32 mmol/L   Glucose, Bld 114 (H) 65 - 99 mg/dL   BUN 8 6 - 20 mg/dL   Creatinine, Ser 1.04 (H) 0.44 - 1.00 mg/dL   Calcium 8.9 8.9 - 10.3 mg/dL   GFR calc non Af Amer 59 (L) >60 mL/min   GFR calc Af Amer >60 >60 mL/min   Anion gap 6 5 - 15  Troponin I  Result Value Ref Range   Troponin I <0.03 <0.03 ng/mL  Fibrin derivatives D-Dimer ([facility] only)  Result Value Ref Range   Fibrin derivatives D-dimer ([facility]) 909 (H) 0 - 99991111  Basic metabolic panel   Result Value Ref Range  Sodium 135 135 - 145 mmol/L   Potassium 4.7 3.5 - 5.1 mmol/L   Chloride 98 (L) 101 - 111 mmol/L   CO2 31 22 - 32 mmol/L   Glucose, Bld 202 (H) 65 - 99 mg/dL   BUN 10 6 - 20 mg/dL   Creatinine, Ser 1.05 (H) 0.44 - 1.00 mg/dL   Calcium 8.6 (L) 8.9 - 10.3 mg/dL   GFR calc non Af Amer 58 (L) >60 mL/min   GFR calc Af Amer >60 >60 mL/min   Anion gap 6 5 - 15  CBC  Result Value Ref Range   WBC 7.3 3.6 - 11.0 K/uL   RBC 4.06 3.80 - 5.20 MIL/uL   Hemoglobin 12.8 12.0 - 16.0 g/dL   HCT 36.8 35.0 - 47.0 %   MCV 90.6 80.0 - 100.0 fL   MCH 31.5 26.0 - 34.0 pg   MCHC 34.8 32.0 - 36.0 g/dL   RDW 14.7 (H) 11.5 - 14.5 %   Platelets 177 150 - 440 K/uL  Hemoglobin A1c  Result Value Ref Range   Hgb A1c MFr Bld 5.7 (H) 4.8 - 5.6 %   Mean Plasma Glucose 117 mg/dL      Assessment & Plan:   Problem List Items Addressed This Visit      Respiratory   Moderate COPD (chronic obstructive pulmonary disease) ([place])    Patient did not hear about last referral from May 2017; will refer to pulmonologist; patient unfortunately still smokes; I am here to help her quit if/when ready      Relevant Orders   Ambulatory referral to Pulmonology     Other   Pain in right knee    Chronic, refer to orthopaedist      Relevant Orders   Ambulatory referral to Orthopedic Surgery   Morbid obesity ([place])    Refer to nutritionist; encourage plant-based diet, avoidance of sugary beverages      Relevant Orders   Amb ref to Medical Nutrition Therapy-MNT    Other Visit Diagnoses    Screening for breast cancer    -  Primary   Relevant Orders   MM DIGITAL SCREENING BILATERAL      Follow up plan: Return in about 3 weeks (around 04/23/2016) for follow-up, weight management.  An after-visit summary was printed and given to the patient at [place].  Please see the patient instructions which may contain other information and recommendations beyond what is mentioned above in the assessment  and plan.  No orders of the defined types were placed in this encounter.   Orders Placed This Encounter  Procedures  . MM DIGITAL SCREENING BILATERAL  . Amb ref to Medical Nutrition Therapy-MNT  . Ambulatory referral to Pulmonology  . Ambulatory referral to Orthopedic Surgery   Face-to-face time with patient was more than 25 minutes, >50% time spent counseling and coordination of care

## 2016-04-02 NOTE — Assessment & Plan Note (Signed)
Refer to nutritionist; encourage plant-based diet, avoidance of sugary beverages

## 2016-04-02 NOTE — Assessment & Plan Note (Signed)
Chronic, refer to orthopaedist 

## 2016-04-03 ENCOUNTER — Other Ambulatory Visit: Payer: Self-pay | Admitting: *Deleted

## 2016-04-03 NOTE — Patient Outreach (Signed)
[place] [facility]) Care Management   04/03/2016  Nicole Austin 07/05/1960 [id number]  Nicole Austin is an 56 y.o. female  Subjective: Pt reports on visit with Dr. Sanda Klein yesterday, MD to send referral to  Nutritionist, Orthopedic for right leg and Lung MD.  Pt reports still has sob walking around, has not been using her nebulizer lately.  Pt reports needs refill on  Lasix (use for edema), forgot to let MD know. Pt reports She is staying with her sister who has meningitis, coming home occasionally.  Pt reports she has to move From current apartment by 5/1, causing her stress, does not like change.   Objective:   Vitals:   04/03/16 1459  BP: 106/60  Pulse: 87  Resp: (!) 24    ROS  Physical Exam  Constitutional: She is oriented to person, place, and time. She appears well-developed and well-nourished.  Cardiovascular: Normal rate, regular rhythm and normal heart sounds.   Respiratory: Effort normal.  Anterior left upper lobe, congestion noted upon expiration.   GI: Soft. Bowel sounds are normal.  Musculoskeletal: Normal range of motion. She exhibits no edema.  Neurological: She is alert and oriented to person, place, and time.  Skin: Skin is warm and dry.  Psychiatric: Her behavior is normal. Judgment and thought content normal.  Tearful at times over thought of moving.       Encounter Medications:   Outpatient Encounter Prescriptions as of 04/03/2016  Medication Sig Note  . ALPRAZolam (XANAX) 0.5 MG tablet Take 1 tablet (0.5 mg total) by mouth 2 (two) times daily as needed for anxiety. 03/06/2016: As needed.   Marland Kitchen aspirin EC 81 MG tablet Take 81 mg by mouth daily.   . divalproex (DEPAKOTE) 250 MG DR tablet Take 1 tablet (250 mg total) by mouth 2 (two) times daily. 03/06/2016: Per pt, out of medication   . escitalopram (LEXAPRO) 20 MG tablet Take 1 tablet (20 mg total) by mouth daily.   Marland Kitchen etodolac (LODINE) 200 MG capsule TAKE 1 CAPSULE BY MOUTH EVERY 8 HOURS  ASNEEDED FOR PAIN. TAKE WITH FOOD 04/03/2016: As needed.   . lidocaine (XYLOCAINE) 5 % ointment Apply 1 application topically as needed for mild pain. 04/03/2016: As needed.   Marland Kitchen LYRICA 200 MG capsule TAKE 1 CAPSULE BY MOUTH TWICE DAILY   . Multiple Vitamin (MULTIVITAMIN WITH MINERALS) TABS tablet Take 1 tablet by mouth daily.   . QUEtiapine (SEROQUEL XR) 300 MG 24 hr tablet TAKE 1 TABLET BY MOUTH AT BEDTIME   . ranitidine (ZANTAC) 150 MG tablet Take 1 tablet (150 mg total) by mouth 2 (two) times daily.   Marland Kitchen Respiratory Therapy Supplies (NEBULIZER COMPRESSOR) KIT One nebulizer kit/machine with tubing; for use with nebulized albuterol 02/10/2016: Pt needs new machine.   . Tiotropium Bromide Monohydrate (SPIRIVA RESPIMAT) 2.5 MCG/ACT AERS Inhale 2 puffs into the lungs daily.   Marland Kitchen tiZANidine (ZANAFLEX) 4 MG tablet Take 4 mg by mouth every 8 (eight) hours as needed. Take 4 mg every 8 hours as needed. 04/03/2016: As  Needed.   Marland Kitchen albuterol (PROVENTIL HFA;VENTOLIN HFA) 108 (90 Base) MCG/ACT inhaler Inhale 2 puffs into the lungs every 6 (six) hours as needed for wheezing or shortness of breath. (Patient not taking: Reported on 04/03/2016) 03/06/2016: As needed.    Marland Kitchen albuterol (PROVENTIL) (2.5 MG/3ML) 0.083% nebulizer solution Take 3 mLs (2.5 mg total) by nebulization every 4 (four) hours as needed for wheezing or shortness of breath. (Patient not taking: Reported on  04/03/2016)   . atorvastatin (LIPITOR) 40 MG tablet Take 1 tablet (40 mg total) by mouth at bedtime. (Patient not taking: Reported on 04/03/2016) 04/03/2016: Not been taking, to start   . furosemide (LASIX) 20 MG tablet Take 1 tablet (20 mg total) by mouth daily. (Patient not taking: Reported on 04/03/2016)   . potassium chloride (KLOR-CON 10) 10 MEQ tablet One by mouth daily (Patient not taking: Reported on 04/03/2016)    No facility-administered encounter medications on file as of 04/03/2016.     Functional Status:   In your present state of health, do you have any  difficulty performing the following activities: 04/02/2016 02/27/2016  Hearing? N N  Vision? Y N  Difficulty concentrating or making decisions? N N  Walking or climbing stairs? Y Y  Dressing or bathing? N N  Doing errands, shopping? Y Y  Preparing Food and eating ? - -  Using the Toilet? - -  In the past six months, have you accidently leaked urine? - -  Do you have problems with loss of bowel control? - -  Managing your Medications? - -  Managing your Finances? - -  Housekeeping or managing your Housekeeping? - -  Some recent data might be hidden    Fall/Depression Screening:    PHQ 2/9 Scores 04/02/2016 01/10/2016 01/05/2016 12/26/2015 12/13/2015 11/25/2015 08/29/2015  PHQ - 2 Score _0 0 0 0  PHQ- 9 Score - 12 - - - - -  Exception Documentation - - - - - - -    Assessment:  56 year old woman, lives alone but has been staying with sister (recovering from meningitis)                      COPD: congestion noted upon expiration in anterior left upper lobe, all other fields clear.                          Pt not using nebulizer machine, as discussed to do twice a day, more if needed.                           Pt continues to smoke, wants to quit (MD provided information on smoking cessation                            Classes and Quit Now Line.    Plan:  Plan to continue to follow pt for transition of care, follow up again this week - final transition of care            Call.            As discussed, pt to start back using nebulizer machine at least twice a day, more if needed to                 Help with congestion.             Pt to call MD office, request refill on Lasix.    THN CM Care Plan Problem Three   Flowsheet Row Most Recent Value  Care Plan Problem Three  Risk for readmission related to recent hospitalization - COPD exacerbation , right sided chest pain, bronchitis   Role Documenting the Problem Three  Care Management Coordinator  Care Plan for Problem Three  Active  [facility]  CM Short Term Goal #  2 (0-30 days)  Pt would keep MD appointment within the next 30 days   [facility]N CM Short Term Goal #2 Start Date  03/06/16  [facility] CM Short Term Goal #2 Met Date  04/03/16 [met, pt saw Dr. Sanda Klein 2/5. ]     Zara Chess.   [place] Care Management  [phone]

## 2016-04-04 ENCOUNTER — Other Ambulatory Visit: Payer: Self-pay

## 2016-04-04 MED ORDER — FUROSEMIDE 20 MG PO TABS: ORAL_TABLET | ORAL | 0 refills | Status: DC

## 2016-04-04 NOTE — Telephone Encounter (Signed)
I reviewed her echocardiogram; she does not have congestive heart failure that I can find in the chart Please let her know that I'd like to taper her off of the furosemide Take one every other day for five doses, then one every three days for five doses, then stop

## 2016-04-05 NOTE — Telephone Encounter (Signed)
left detailed voice mail

## 2016-04-06 ENCOUNTER — Other Ambulatory Visit: Payer: Self-pay | Admitting: *Deleted

## 2016-04-06 ENCOUNTER — Ambulatory Visit: Payer: Commercial Managed Care - HMO | Admitting: *Deleted

## 2016-04-06 ENCOUNTER — Ambulatory Visit: Payer: Self-pay | Admitting: *Deleted

## 2016-04-06 NOTE — Patient Outreach (Signed)
Final transition of care call completed - follow up on recent hospitalization 1/1-1/4 COPD exacerbation, bronchitis.   Spoke with pt, HIPAA/identity verified.  Pt reports has not started back doing nebulizer treatments, congestion not as bad.  Pt reports did follow up with Dr. Sanda Klein about Lasix, prescription refilled-  to take for 5 days, then one every other day for so long, has prescription at home.  RN CM looked in Epic (medications ordered), relayed to pt  Order for  Lasix (20 mg)- to take one tablet every other day for five doses, then one every third day for five doses,then stop to which pt voiced understanding.  Pt reports swelling in feet.   Pt reports taking all of her medications, Lipitor in am.      Plan: RN CM to continue to provide community nurse case management services.   As discussed with pt, plan to follow up again in 3 weeks telephonically.    Zara Chess.   [place] Care Management  [phone]

## 2016-04-09 ENCOUNTER — Encounter: Payer: Self-pay | Admitting: Family Medicine

## 2016-04-11 ENCOUNTER — Ambulatory Visit (INDEPENDENT_AMBULATORY_CARE_PROVIDER_SITE_OTHER): Payer: Commercial Managed Care - HMO | Admitting: Psychiatry

## 2016-04-11 ENCOUNTER — Encounter: Payer: Self-pay | Admitting: Psychiatry

## 2016-04-11 VITALS — BP 113/72 | HR 99 | Temp 97.6°F | Wt 276.0 lb

## 2016-04-11 DIAGNOSIS — F3130 Bipolar disorder, current episode depressed, mild or moderate severity, unspecified: Secondary | ICD-10-CM | POA: Diagnosis not present

## 2016-04-11 DIAGNOSIS — F4310 Post-traumatic stress disorder, unspecified: Secondary | ICD-10-CM

## 2016-04-11 MED ORDER — QUETIAPINE FUMARATE ER 300 MG PO TB24: 300.0000 mg | ORAL_TABLET | Freq: Every day | ORAL | 2 refills | Status: DC

## 2016-04-11 MED ORDER — DIVALPROEX SODIUM 250 MG PO DR TAB: 250.0000 mg | DELAYED_RELEASE_TABLET | Freq: Two times a day (BID) | ORAL | 2 refills | Status: DC

## 2016-04-11 MED ORDER — ESCITALOPRAM OXALATE 20 MG PO TABS: 20.0000 mg | ORAL_TABLET | Freq: Every day | ORAL | 2 refills | Status: DC

## 2016-04-11 MED ORDER — ALPRAZOLAM 0.5 MG PO TABS
0.5000 mg | ORAL_TABLET | Freq: Two times a day (BID) | ORAL | 2 refills | Status: DC | PRN
Start: 2016-04-11 — End: 2016-07-04

## 2016-04-11 NOTE — Progress Notes (Signed)
Patient ID: Nicole Austin, female   DOB: 08/23/1960, 56 y.o.   MRN: [id number] [facility] MD/PA/NP OP Progress Note  04/11/2016 1:57 PM Nicole Austin  MRN:  [id number]  Subjective:  Patient is a 56 year old Caucasian female with history of bipolar disorder and posttraumatic stress disorder who was seen for follow-up. She reported that her sister has recently been diagnosed with meningitis. She was in the hospital for 3 weeks and currently at home. Patient reported that she has been taking care of her. Patient reported that she has been feeling depressed and anxious because of her sister. She also has to evacuate  her home on May 1. She reported that she has been compliant with her medications. She denied having any side effects of medications. She is worried about her sister and has been helping her and her boyfriend. We discussed about her medications in detail. She denied having any suicidal homicidal ideations or plans.   We discussed about starting her therapy on the regular basis and she agreed with the plan.     Chief Complaint:  Chief Complaint    Follow-up; Medication Refill     Visit Diagnosis:     ICD-9-CM ICD-10-CM   1. Bipolar I disorder, most recent episode depressed ([place]) 296.50 F31.30   2. PTSD (post-traumatic stress disorder) 309.81 F43.10     Past Medical History:  Past Medical History:  Diagnosis Date  . ADHD (attention deficit hyperactivity disorder)   . Arthritis   . Asthma   . Bipolar 1 disorder ([place])   . COPD (chronic obstructive pulmonary disease) ([place])   . Depression   . GERD (gastroesophageal reflux disease) 08/30/2015  . History of acute myocardial infarction 06/30/2015   Overview:  [facility]   . Low HDL (under 40) 08/30/2015  . MI (myocardial infarction)   . PTSD (post-traumatic stress disorder)   . Rhabdomyolysis   . Stroke [facility])     Past Surgical History:  Procedure Laterality Date  . BACK SURGERY    . REPLACEMENT TOTAL KNEE Left    Family History:   Family History  Problem Relation Age of Onset  . Hernia Mother   . Heart disease Mother   . OCD Mother   . Diabetes Mother   . Parkinson's disease Father   . Bipolar disorder Sister   . Schizophrenia Sister   . ADD / ADHD Sister   . Alcohol abuse Brother   . Bipolar disorder Sister   . Paranoid behavior Sister   . ADD / ADHD Sister    Social History:  Social History   Social History  . Marital status: Single    Spouse name: N/A  . Number of children: N/A  . Years of education: N/A   Social History Main Topics  . Smoking status: Current Every Day Smoker    Packs/day: 1.00    Years: 36.00    Types: Cigarettes    Start date: 10/11/1984  . Smokeless tobacco: Never Used  . Alcohol use No  . Drug use: No  . Sexual activity: No   Other Topics Concern  . None   Social History Narrative  . None   Additional History:   Assessment:   Musculoskeletal: Strength & Muscle Tone: within normal limits Gait & Station: limping Patient leans: N/A  Psychiatric Specialty Exam: Anxiety  Symptoms include insomnia and nervous/anxious behavior. Patient reports no suicidal ideas.    Depression         Associated symptoms include insomnia.  Associated  symptoms include no suicidal ideas.  Past medical history includes anxiety.   Insomnia  PMH includes: depression.    Review of Systems  Psychiatric/Behavioral: Positive for depression. Negative for hallucinations, memory loss, substance abuse and suicidal ideas. The patient is nervous/anxious and has insomnia.     Blood pressure 113/72, pulse 99, temperature 97.6 F (36.4 C), temperature source Oral, weight 276 lb (125.2 kg).Body mass index is 52.15 kg/m.  General Appearance:fair   Eye Contact:  Good  Speech:  Clear and Coherent and Normal Rate  Volume:  Normal  Mood:  Anxious and Depressed  Affect:  Constricted   Thought Process:  Linear  Orientation:  Full (Time, Place, and Person)  Thought Content:  Negative  Suicidal  Thoughts:  No  Homicidal Thoughts:  No  Memory:  Immediate;   Good Recent;   Good Remote;   Good  Judgement:  Good  Insight:  Good  Psychomotor Activity:  Negative  Concentration:  Good  Recall:  Good  Fund of Knowledge: Good  Language: Good  Akathisia:  Negative  Handed:  Right unknown  AIMS (if indicated):  Not done  Assets:  Communication Skills Housing Social Support  ADL's:  Intact  Cognition: WNL  Sleep:  Good   Is the patient at risk to self?  No. Has the patient been a risk to self in the past 6 months?  No. Has the patient been a risk to self within the distant past?  Yes.   Is the patient a risk to others?  No. Has the patient been a risk to others in the past 6 months?  No. Has the patient been a risk to others within the distant past?  No.  Current Medications: Current Outpatient Prescriptions  Medication Sig Dispense Refill  . albuterol (PROVENTIL HFA;VENTOLIN HFA) 108 (90 Base) MCG/ACT inhaler Inhale 2 puffs into the lungs every 6 (six) hours as needed for wheezing or shortness of breath. 1 Inhaler 0  . albuterol (PROVENTIL) (2.5 MG/3ML) 0.083% nebulizer solution Take 3 mLs (2.5 mg total) by nebulization every 4 (four) hours as needed for wheezing or shortness of breath. 75 mL 2  . ALPRAZolam (XANAX) 0.5 MG tablet Take 1 tablet (0.5 mg total) by mouth 2 (two) times daily as needed for anxiety. 60 tablet 2  . aspirin EC 81 MG tablet Take 81 mg by mouth daily.    Marland Kitchen atorvastatin (LIPITOR) 40 MG tablet Take 1 tablet (40 mg total) by mouth at bedtime. 30 tablet 3  . divalproex (DEPAKOTE) 250 MG DR tablet Take 1 tablet (250 mg total) by mouth 2 (two) times daily. 60 tablet 2  . escitalopram (LEXAPRO) 20 MG tablet Take 1 tablet (20 mg total) by mouth daily. 30 tablet 2  . etodolac (LODINE) 200 MG capsule TAKE 1 CAPSULE BY MOUTH EVERY 8 HOURS ASNEEDED FOR PAIN. TAKE WITH FOOD 90 capsule 0  . furosemide (LASIX) 20 MG tablet One every other day for five doses, then one  every third day for five doses, then stop 10 tablet 0  . lidocaine (XYLOCAINE) 5 % ointment Apply 1 application topically as needed for mild pain.    Marland Kitchen LYRICA 200 MG capsule TAKE 1 CAPSULE BY MOUTH TWICE DAILY 60 capsule 0  . Multiple Vitamin (MULTIVITAMIN WITH MINERALS) TABS tablet Take 1 tablet by mouth daily.    . potassium chloride (KLOR-CON 10) 10 MEQ tablet One by mouth daily 30 tablet 2  . QUEtiapine (SEROQUEL XR) 300 MG 24 hr  tablet Take 1 tablet (300 mg total) by mouth at bedtime. 30 tablet 2  . ranitidine (ZANTAC) 150 MG tablet Take 1 tablet (150 mg total) by mouth 2 (two) times daily. 60 tablet 2  . Respiratory Therapy Supplies (NEBULIZER COMPRESSOR) KIT One nebulizer kit/machine with tubing; for use with nebulized albuterol 1 each 0  . Tiotropium Bromide Monohydrate (SPIRIVA RESPIMAT) 2.5 MCG/ACT AERS Inhale 2 puffs into the lungs daily. 4 g 11  . tiZANidine (ZANAFLEX) 4 MG tablet Take 4 mg by mouth every 8 (eight) hours as needed. Take 4 mg every 8 hours as needed.     No current facility-administered medications for this visit.     Medical Decision Making:  Established Problem, Stable/Improving (1) and Review or order clinical lab tests (1)  Treatment Plan Summary:Medication management    Bipolar disorder-continue Seroquel XR at 300 mg at bedtime.   Discussed with patient about her Xanax dose  Continue  Xanax 0.5 mg  twice a day    Continue her Lexapro 20 mg a day.  She is on Depakote 250 mg twice a day   PTSD-stable. Will continue the Lexapro and Seroquel.  Patient will follow up in 2 months  or earlier depending on her symptoms    More than 50% of the time spent in psychoeducation, counseling and coordination of care.    This note was generated in part or whole with voice recognition software. Voice regonition is usually quite accurate but there are transcription errors that can and very often do occur. I apologize for any typographical errors that were not detected  and corrected.    Rainey Pines, MD  04/11/2016, 1:57 PM

## 2016-04-12 ENCOUNTER — Other Ambulatory Visit: Payer: Self-pay | Admitting: *Deleted

## 2016-04-12 NOTE — Patient Outreach (Signed)
[facility]) Care Management  04/12/2016  Nicole Austin [birth year] [id number]   Phone call to patient to confirm that personal care services have been  arranged through [facility].  HIPPA compliant voicemail message left requesting a return call.   [name] [facility] Care Management [phone]

## 2016-04-17 ENCOUNTER — Other Ambulatory Visit: Payer: Self-pay | Admitting: Family Medicine

## 2016-04-17 ENCOUNTER — Telehealth: Payer: Self-pay | Admitting: Family Medicine

## 2016-04-17 NOTE — Telephone Encounter (Signed)
We have chantix listed as an allergy I don't recommend she take it

## 2016-04-17 NOTE — Telephone Encounter (Signed)
Pt states she is on Chantix and she states it makes her feel nausea and wants to know if something can be called into [facility].

## 2016-04-18 MED ORDER — BUPROPION HCL ER (SMOKING DET) 150 MG PO TB12: ORAL_TABLET | ORAL | 0 refills | Status: DC

## 2016-04-18 NOTE — Telephone Encounter (Signed)
Rx sent Encourage her to pick a quit date on day 8 of this medicine If she starts the medicine this Thursday, pick next Thursday as her quit date, for example Check out 800-QUIT-NOW or the smoking cessation classes to help her Good luck!

## 2016-04-18 NOTE — Telephone Encounter (Signed)
Patient wants to see about getting something else?

## 2016-04-18 NOTE — Telephone Encounter (Signed)
Left detailed voicemail

## 2016-04-19 ENCOUNTER — Encounter: Payer: Self-pay | Admitting: *Deleted

## 2016-04-19 ENCOUNTER — Telehealth: Payer: Self-pay | Admitting: Family Medicine

## 2016-04-19 ENCOUNTER — Other Ambulatory Visit: Payer: Self-pay | Admitting: *Deleted

## 2016-04-19 DIAGNOSIS — M5431 Sciatica, right side: Secondary | ICD-10-CM

## 2016-04-19 NOTE — Assessment & Plan Note (Signed)
Talked with patient; zanaflex isn't really helping; will do PT with iontophoresis, TENS unit if helpful

## 2016-04-19 NOTE — Patient Outreach (Signed)
[facility]) Care Management  04/19/2016  Nicole Austin [birth year] [id number]   Phone call to patient to follow up on personal care services.  Patient states that she has been at her sister's home providing care as her sister has been ill.  Patient is back home now and states that she is doing well.  Per patient, she does not have to use her cain and she is taking her medications as prescribed.  Patient states that she has come a long way. Patient plans to move in with her best friend by 5/1 due to problems dealing with [facility].  Patient states that at this time she does not feel that she needs an in home aid at this time.   Patient verbalized having no additional community resource needs.  Patient to be closed to social work at this time.  Patient's provider and RNCM Rose Pierzchala to be notified of case closure.     [name] [facility] Care Management [phone]

## 2016-04-19 NOTE — Telephone Encounter (Signed)
I called patient She has sciatica; sharp like an ice pick type of pain Very distracting Zanaflex not really helping This has been going on a few months She is not doing PT and I'll approve iontophoresis and TENS unit if needed

## 2016-04-23 ENCOUNTER — Ambulatory Visit: Payer: Self-pay | Admitting: Family Medicine

## 2016-04-24 ENCOUNTER — Other Ambulatory Visit: Payer: Self-pay | Admitting: Family Medicine

## 2016-04-24 ENCOUNTER — Encounter: Payer: Self-pay | Admitting: *Deleted

## 2016-04-24 ENCOUNTER — Institutional Professional Consult (permissible substitution): Payer: Commercial Managed Care - HMO | Admitting: Pulmonary Disease

## 2016-04-27 ENCOUNTER — Other Ambulatory Visit: Payer: Self-pay | Admitting: *Deleted

## 2016-04-27 NOTE — Patient Outreach (Signed)
Attempt made to contact pt, follow up on her  self management of COPD.  This RN CM followed pt for transition of care/completed- recent hospitalization 1/1-1/4 COPD exacerbation, bronchitis.   HIPAA compliant voice message left with contact name and number.     Plan:  If no response, plan to follow up again next week telephonically.     Zara Chess.   [place] Care Management  [phone]

## 2016-05-01 ENCOUNTER — Other Ambulatory Visit: Payer: Self-pay | Admitting: *Deleted

## 2016-05-01 NOTE — Patient Outreach (Signed)
Follow up phone call successful.  Spoke with pt, HIPAA verified, discussed purpose of call, check on her  respiratory status(hx of COPD) to which pt reports could be better, doing her nebulizer treatments, Spiriva - helping.  Pt reports swelling in legs is  up and down, if stay off feet comes down, currently retaining fluid. Pt reports she completed the Lasix, MD would not order it again.  Pt reports she needs she needs new support stockings, too small to which RN CM informed her can obtain an order from MD.  Pt reports she has an OTC wellness catalog where she can order support stockings.   Pt reports cough is still there, still got some congestion, weather does not help.  Pt reports gets sob walking short distances, coming from being overweight.  RN CM reviewed with pt signs and symptoms of COPD- cough, increased sob, need to call MD and schedule office visit to be assessed to which pt said she would  Call.  Plan:  As discussed with pt, plan to call her again in 3 days, check on status/see if she called MD, scheduled office visit.    Zara Chess.   [place] Care Management  [phone]

## 2016-05-02 DIAGNOSIS — H5213 Myopia, bilateral: Secondary | ICD-10-CM | POA: Diagnosis not present

## 2016-05-02 DIAGNOSIS — H52209 Unspecified astigmatism, unspecified eye: Secondary | ICD-10-CM | POA: Diagnosis not present

## 2016-05-04 ENCOUNTER — Other Ambulatory Visit: Payer: Self-pay | Admitting: *Deleted

## 2016-05-04 NOTE — Patient Outreach (Signed)
Attempt made to contact pt, follow up on respiratory status, call to MD.  View in Epic/appointment desk- pt has an appointment with Dr. Sanda Klein 3/12.   HIPAA compliant voice message left with contact number.  Plan:  If no response, plan to follow up with pt again next week telephonically after visit with MD.    Zara Chess.   [place] Care Management  [phone]

## 2016-05-07 ENCOUNTER — Other Ambulatory Visit: Payer: Self-pay | Admitting: Family Medicine

## 2016-05-07 ENCOUNTER — Encounter: Payer: Self-pay | Admitting: Family Medicine

## 2016-05-07 ENCOUNTER — Other Ambulatory Visit: Payer: Self-pay

## 2016-05-07 ENCOUNTER — Ambulatory Visit (INDEPENDENT_AMBULATORY_CARE_PROVIDER_SITE_OTHER): Payer: Medicare HMO | Admitting: Family Medicine

## 2016-05-07 VITALS — BP 112/72 | HR 94 | Temp 98.0°F | Resp 16 | Wt 273.6 lb

## 2016-05-07 DIAGNOSIS — J449 Chronic obstructive pulmonary disease, unspecified: Secondary | ICD-10-CM | POA: Diagnosis not present

## 2016-05-07 DIAGNOSIS — G894 Chronic pain syndrome: Secondary | ICD-10-CM

## 2016-05-07 DIAGNOSIS — G609 Hereditary and idiopathic neuropathy, unspecified: Secondary | ICD-10-CM | POA: Diagnosis not present

## 2016-05-07 DIAGNOSIS — G629 Polyneuropathy, unspecified: Secondary | ICD-10-CM | POA: Insufficient documentation

## 2016-05-07 DIAGNOSIS — Z1211 Encounter for screening for malignant neoplasm of colon: Secondary | ICD-10-CM | POA: Diagnosis not present

## 2016-05-07 DIAGNOSIS — R7303 Prediabetes: Secondary | ICD-10-CM | POA: Diagnosis not present

## 2016-05-07 MED ORDER — FUROSEMIDE 20 MG PO TABS: ORAL_TABLET | ORAL | 2 refills | Status: DC

## 2016-05-07 MED ORDER — POTASSIUM CHLORIDE ER 10 MEQ PO TBCR: EXTENDED_RELEASE_TABLET | ORAL | 2 refills | Status: DC

## 2016-05-07 MED ORDER — LIRAGLUTIDE -WEIGHT MANAGEMENT 18 MG/3ML ~~LOC~~ SOPN: 0.6000 mg | PEN_INJECTOR | Freq: Every day | SUBCUTANEOUS | 0 refills | Status: DC

## 2016-05-07 MED ORDER — GLYCOPYRROLATE-FORMOTEROL 9-4.8 MCG/ACT IN AERO: 2.0000 | INHALATION_SPRAY | Freq: Two times a day (BID) | RESPIRATORY_TRACT | 11 refills | Status: DC

## 2016-05-07 MED ORDER — PREGABALIN 200 MG PO CAPS: 200.0000 mg | ORAL_CAPSULE | Freq: Two times a day (BID) | ORAL | 2 refills | Status: DC

## 2016-05-07 NOTE — Telephone Encounter (Signed)
Pt need refill on lyric . She mention to me before she left. Lasix and Klor-con needs should be sent to [facility] instead.

## 2016-05-07 NOTE — Patient Instructions (Signed)
Let's start the Saxenda Stop the Spiriva Start the [place] Use the fluid pill and potassium every 3 days Recheck in 4 weeks

## 2016-05-07 NOTE — Progress Notes (Signed)
BP 112/72   Pulse 94   Temp 98 F (36.7 C) (Oral)   Resp 16   Wt 273 lb 9.6 oz (124.1 kg)   LMP  (LMP Unknown)   SpO2 95%   BMI 51.70 kg/m    Subjective:    Patient ID: Nicole Austin, female    DOB: [birth year], 56 y.o.   MRN: [id number]  HPI: Nicole Austin is a 56 y.o. female  Chief Complaint  Patient presents with  . Follow-up  . Medication Refill   Patient is here for f/u  COPD; nebulizer treatments help her breathing  Swelling in the legs, fluid retention She has been taking OTC fluid pills Tries to avoid salt She started using pepper instead of salt She had slight decrease in her GFR, so we weaned her off of furosemide She got her tongue pierced and her eyebrow pierced last week  Prediabetes; last A1c was 5.7 in January; struggling with weight, eating  Her feet were screaming at her at night; she has tried new shoes, thrown flip flops away; she now supportive shoes; she does not want to put her feet down on the floor in the morning; feet have been bothering her for several months  Her obesity is getting her down; was disappointed that bariatric surgeon would not see her because of bill owed to [facility]; she only started to struggle with weight gain a few years ago  Depression screen [facility] 2/9 05/07/2016 04/02/2016 01/10/2016 01/05/2016 12/26/2015  Decreased Interest 0 0 2 0 1  Down, Depressed, Hopeless 1 1 - 1 0  PHQ - 2 Score 1 1 2 1 1   Altered sleeping - - 1 - -  Tired, decreased energy - - 1 - -  Change in appetite - - 2 - -  Feeling bad or failure about yourself  - - 3 - -  Trouble concentrating - - - - -  Moving slowly or fidgety/restless - - 3 - -  Suicidal thoughts - - 0 - -  PHQ-9 Score - - 12 - -  Difficult doing work/chores - - Very difficult - -  Some recent data might be hidden   Relevant past medical, surgical, family and social history reviewed Past Medical History:  Diagnosis Date  . ADHD (attention deficit hyperactivity  disorder)   . Arthritis   . Asthma   . Bipolar 1 disorder ([place])   . COPD (chronic obstructive pulmonary disease) ([place])   . Depression   . GERD (gastroesophageal reflux disease) 08/30/2015  . History of acute myocardial infarction 06/30/2015   Overview:  [facility]   . Low HDL (under 40) 08/30/2015  . MI (myocardial infarction)   . Prediabetes 05/20/2016  . PTSD (post-traumatic stress disorder)   . Rhabdomyolysis   . Stroke [facility])    Past Surgical History:  Procedure Laterality Date  . BACK SURGERY    . REPLACEMENT TOTAL KNEE Left    Family History  Problem Relation Age of Onset  . Hernia Mother   . Heart disease Mother   . OCD Mother   . Diabetes Mother   . Parkinson's disease Father   . Bipolar disorder Sister   . Schizophrenia Sister   . ADD / ADHD Sister   . Alcohol abuse Brother   . Bipolar disorder Sister   . Paranoid behavior Sister   . ADD / ADHD Sister   . ADD / ADHD Son   . Dementia Maternal Grandmother   . Emphysema  Maternal Grandfather   . ADD / ADHD Son   . ADD / ADHD Son   . Depression Son    Social History  Substance Use Topics  . Smoking status: Current Every Day Smoker    Packs/day: 1.00    Years: 36.00    Types: Cigarettes    Start date: 10/11/1984  . Smokeless tobacco: Never Used  . Alcohol use No    Interim medical history since last visit reviewed. Allergies and medications reviewed  Review of Systems Per HPI unless specifically indicated above     Objective:    BP 112/72   Pulse 94   Temp 98 F (36.7 C) (Oral)   Resp 16   Wt 273 lb 9.6 oz (124.1 kg)   LMP  (LMP Unknown)   SpO2 95%   BMI 51.70 kg/m   Wt Readings from Last 3 Encounters:  05/18/16 273 lb (123.8 kg)  05/07/16 273 lb 9.6 oz (124.1 kg)  04/03/16 273 lb (123.8 kg)    Physical Exam  Constitutional: She appears well-developed and well-nourished. No distress.  HENT:  Head: Normocephalic and atraumatic.  Eyes: EOM are normal. No scleral icterus.  Neck: No thyromegaly  present.  Cardiovascular: Normal rate, regular rhythm and normal heart sounds.   No murmur heard. Pulmonary/Chest: Effort normal and breath sounds normal. No respiratory distress. She has no wheezes.  Abdominal: Soft. Bowel sounds are normal. She exhibits no distension.  Musculoskeletal: Normal range of motion. She exhibits no edema.  Neurological: She is alert. She displays no tremor. She exhibits normal muscle tone.  Sensation is not intact distally to monofilament testing, vibration sense, or cold both feet equally in stocking distribution  Skin: Skin is warm and dry. She is not diaphoretic. No pallor.  Psychiatric: She has a normal mood and affect. Her behavior is normal. Judgment and thought content normal.   Results for orders placed or performed during the hospital encounter of 02/27/16  CBC  Result Value Ref Range   WBC 7.5 3.6 - 11.0 K/uL   RBC 4.11 3.80 - 5.20 MIL/uL   Hemoglobin 13.1 12.0 - 16.0 g/dL   HCT 37.2 35.0 - 47.0 %   MCV 90.5 80.0 - 100.0 fL   MCH 31.8 26.0 - 34.0 pg   MCHC 35.1 32.0 - 36.0 g/dL   RDW 15.1 (H) 11.5 - 14.5 %   Platelets 163 150 - 440 K/uL  Basic metabolic panel  Result Value Ref Range   Sodium 139 135 - 145 mmol/L   Potassium 4.1 3.5 - 5.1 mmol/L   Chloride 100 (L) 101 - 111 mmol/L   CO2 33 (H) 22 - 32 mmol/L   Glucose, Bld 114 (H) 65 - 99 mg/dL   BUN 8 6 - 20 mg/dL   Creatinine, Ser 1.04 (H) 0.44 - 1.00 mg/dL   Calcium 8.9 8.9 - 10.3 mg/dL   GFR calc non Af Amer 59 (L) >60 mL/min   GFR calc Af Amer >60 >60 mL/min   Anion gap 6 5 - 15  Troponin I  Result Value Ref Range   Troponin I <0.03 <0.03 ng/mL  Fibrin derivatives D-Dimer ([facility] only)  Result Value Ref Range   Fibrin derivatives D-dimer ([facility]) 909 (H) 0 - 502  Basic metabolic panel  Result Value Ref Range   Sodium 135 135 - 145 mmol/L   Potassium 4.7 3.5 - 5.1 mmol/L   Chloride 98 (L) 101 - 111 mmol/L   CO2 31 22 - 32  mmol/L   Glucose, Bld 202 (H) 65 - 99 mg/dL   BUN 10 6 - 20  mg/dL   Creatinine, Ser 1.05 (H) 0.44 - 1.00 mg/dL   Calcium 8.6 (L) 8.9 - 10.3 mg/dL   GFR calc non Af Amer 58 (L) >60 mL/min   GFR calc Af Amer >60 >60 mL/min   Anion gap 6 5 - 15  CBC  Result Value Ref Range   WBC 7.3 3.6 - 11.0 K/uL   RBC 4.06 3.80 - 5.20 MIL/uL   Hemoglobin 12.8 12.0 - 16.0 g/dL   HCT 36.8 35.0 - 47.0 %   MCV 90.6 80.0 - 100.0 fL   MCH 31.5 26.0 - 34.0 pg   MCHC 34.8 32.0 - 36.0 g/dL   RDW 14.7 (H) 11.5 - 14.5 %   Platelets 177 150 - 440 K/uL  Hemoglobin A1c  Result Value Ref Range   Hgb A1c MFr Bld 5.7 (H) 4.8 - 5.6 %   Mean Plasma Glucose 117 mg/dL      Assessment & Plan:   Problem List Items Addressed This Visit      Respiratory   Moderate COPD (chronic obstructive pulmonary disease) (HCC)    Will switch therapy; new Rx sent; f/u in 4 weeks; smoking cessation key      Relevant Medications   Glycopyrrolate-Formoterol (BEVESPI AEROSPHERE) 9-4.8 MCG/ACT AERO     Nervous and Auditory   Neuropathy, peripheral (HCC)    Refer to neurologist; sugars are not high enough that I would expect the monofilament testing to be diminished, and she has other deficits as well      Relevant Medications   Liraglutide -Weight Management ([place]) 18 MG/3ML SOPN   Other Relevant Orders   Ambulatory referral to Neurology     Other   Prediabetes    Encouraged weight loss, healthy eating      Morbid obesity ([place])    Patient is frustrated; encouragement given, limit portions, fatty foods, drink enough water, etc; discussed options for medical treatment and she will try Saxenda; Rx sent; close f/u      Relevant Medications   Liraglutide -Weight Management (SAXENDA) 18 MG/3ML SOPN    Other Visit Diagnoses    Screen for colon cancer    -  Primary   Relevant Orders   Ambulatory referral to Gastroenterology       Follow up plan: Return in about 4 weeks (around 06/04/2016) for visit with Dr. Sanda Klein.  An after-visit summary was printed and given to the patient at  [place].  Please see the patient instructions which may contain other information and recommendations beyond what is mentioned above in the assessment and plan.  Meds ordered this encounter  Medications  . DISCONTD: furosemide (LASIX) 20 MG tablet    Sig: One by mouth very third day for swelling    Dispense:  10 tablet    Refill:  2  . DISCONTD: potassium chloride (KLOR-CON 10) 10 MEQ tablet    Sig: One by mouth every three days    Dispense:  10 tablet    Refill:  2  . Glycopyrrolate-Formoterol (BEVESPI AEROSPHERE) 9-4.8 MCG/ACT AERO    Sig: Inhale 2 puffs into the lungs 2 (two) times daily.    Dispense:  1 Inhaler    Refill:  11    STOP Spiriva  . Liraglutide -Weight Management (SAXENDA) 18 MG/3ML SOPN    Sig: Inject 0.6 mg into the skin daily. x 1 week, then 1.2 mg daily  x 1 week, then 1.8 mg daily x 1 week, then 2.4 mg daily x 1 week, then 3 mg daily    Dispense:  3 mL    Refill:  0  . furosemide (LASIX) 20 MG tablet    Sig: One by mouth every three days for swelling    Dispense:  10 tablet    Refill:  2  . potassium chloride (KLOR-CON 10) 10 MEQ tablet    Sig: One by mouth every three days    Dispense:  10 tablet    Refill:  2    Orders Placed This Encounter  Procedures  . Ambulatory referral to Gastroenterology  . Ambulatory referral to Neurology

## 2016-05-07 NOTE — Telephone Encounter (Signed)
[place] web site reviewed Other substances from expected providers Rx approved

## 2016-05-08 ENCOUNTER — Other Ambulatory Visit: Payer: Self-pay | Admitting: Family Medicine

## 2016-05-08 DIAGNOSIS — G894 Chronic pain syndrome: Secondary | ICD-10-CM

## 2016-05-08 NOTE — Telephone Encounter (Signed)
Already addressed yesterday; Rx approved after looking at [employer] site

## 2016-05-09 ENCOUNTER — Other Ambulatory Visit: Payer: Self-pay | Admitting: *Deleted

## 2016-05-09 NOTE — Patient Outreach (Signed)
Another  attempt made to contact pt - check on status (COPD, edema)  review recent visit with Dr. Sanda Klein 3/12- medication changes.Marland Kitchen  HIPAA compliant voice message left with contact name and number.   Plan:  If no response, plan to follow up again next week telephonically.   Zara Chess.   [place] Care Management  [phone]

## 2016-05-10 DIAGNOSIS — R079 Chest pain, unspecified: Secondary | ICD-10-CM | POA: Diagnosis not present

## 2016-05-10 DIAGNOSIS — F419 Anxiety disorder, unspecified: Secondary | ICD-10-CM | POA: Diagnosis not present

## 2016-05-11 ENCOUNTER — Telehealth: Payer: Self-pay

## 2016-05-11 DIAGNOSIS — R079 Chest pain, unspecified: Secondary | ICD-10-CM

## 2016-05-11 DIAGNOSIS — I252 Old myocardial infarction: Secondary | ICD-10-CM

## 2016-05-11 NOTE — Assessment & Plan Note (Signed)
With current chest pain; advised her to go to the ER; refer to cardiologist

## 2016-05-11 NOTE — Telephone Encounter (Signed)
Patient called states she thought she was having a heart attack called EMS they states nothing showed any concern on EKG and it was probably anxiety.  Patient states has had heart attack in past and needs referral to cardio?

## 2016-05-11 NOTE — Telephone Encounter (Signed)
I called patient to find out what's going on Last night she called the ambulance, she thought she was having a heart attack; they hooked her up and the ambulance drivers told her it wasn't a heart attack; she didn't go to the hospital She says that she had a heart attack; happened after her mother died; late June 17, 2014; hospitalized at [facility]; Jan 2017 She is having chest pain now; it's been doing this for about a week; "nothing serious"; that why she called last night; her BP was high; oxygen level was up and down I told her that I advise her to get checked out She says "they" told her it's breathing treatments and stress related She is taking aspirin I'll make referral, but explained I cannot tell her what's wrong over the phone; if she has chest pain and thinks it's her heart, she needs to go to the ER

## 2016-05-14 ENCOUNTER — Other Ambulatory Visit: Payer: Self-pay | Admitting: *Deleted

## 2016-05-14 NOTE — Patient Outreach (Signed)
Third attempt made to contact pt, check on respiratory status (COPD,edema), review recent office visit with Dr. Sanda Klein 3/12.  View in [place]- pt's call to Dr. Sanda Klein 3/16  reported EMS call for chest pain on 3/15, told anxiety, also reported  current chest pain to  which MD advised  to go to ER.  HIPAA compliant voice message left with contact name and number.    Plan:  If no response to voice message left and being third phone call attempt, will send unable to contact letter- no response to letter in 10 business days, close case and notify MD.    Zara Chess.   [place] Care Management  [phone]

## 2016-05-15 ENCOUNTER — Other Ambulatory Visit: Payer: Self-pay | Admitting: Family Medicine

## 2016-05-15 ENCOUNTER — Other Ambulatory Visit: Payer: Self-pay | Admitting: Psychiatry

## 2016-05-15 NOTE — Telephone Encounter (Signed)
SGPT and lipids from August 2017 reviewed; Rx approved

## 2016-05-16 ENCOUNTER — Encounter: Payer: Self-pay | Admitting: *Deleted

## 2016-05-18 ENCOUNTER — Encounter: Payer: Self-pay | Admitting: Emergency Medicine

## 2016-05-18 ENCOUNTER — Emergency Department
Admission: EM | Admit: 2016-05-18 | Discharge: 2016-05-19 | Disposition: A | Discharge status: Home / Self Care | Payer: Medicare HMO | Attending: Emergency Medicine | Admitting: Emergency Medicine

## 2016-05-18 DIAGNOSIS — J449 Chronic obstructive pulmonary disease, unspecified: Secondary | ICD-10-CM | POA: Insufficient documentation

## 2016-05-18 DIAGNOSIS — Z79899 Other long term (current) drug therapy: Secondary | ICD-10-CM | POA: Diagnosis not present

## 2016-05-18 DIAGNOSIS — I252 Old myocardial infarction: Secondary | ICD-10-CM | POA: Diagnosis not present

## 2016-05-18 DIAGNOSIS — F909 Attention-deficit hyperactivity disorder, unspecified type: Secondary | ICD-10-CM | POA: Insufficient documentation

## 2016-05-18 DIAGNOSIS — R0789 Other chest pain: Secondary | ICD-10-CM | POA: Diagnosis not present

## 2016-05-18 DIAGNOSIS — J45909 Unspecified asthma, uncomplicated: Secondary | ICD-10-CM | POA: Insufficient documentation

## 2016-05-18 DIAGNOSIS — R0602 Shortness of breath: Secondary | ICD-10-CM | POA: Insufficient documentation

## 2016-05-18 DIAGNOSIS — F17210 Nicotine dependence, cigarettes, uncomplicated: Secondary | ICD-10-CM | POA: Diagnosis not present

## 2016-05-18 DIAGNOSIS — R079 Chest pain, unspecified: Secondary | ICD-10-CM | POA: Diagnosis not present

## 2016-05-18 LAB — CBC
HCT: 40 % (ref 35.0–47.0)
HEMOGLOBIN: 14 g/dL (ref 12.0–16.0)
MCH: 31.3 pg (ref 26.0–34.0)
MCHC: 34.9 g/dL (ref 32.0–36.0)
MCV: 89.7 fL (ref 80.0–100.0)
PLATELETS: 185 10*3/uL (ref 150–440)
RBC: 4.46 MIL/uL (ref 3.80–5.20)
RDW: 14 % (ref 11.5–14.5)
WBC: 9.7 10*3/uL (ref 3.6–11.0)

## 2016-05-18 MED ORDER — ONDANSETRON HCL 4 MG/2ML IJ SOLN
4.0000 mg | Freq: Once | INTRAMUSCULAR | Status: AC
  Administered 2016-05-19: 4 mg via INTRAVENOUS
  Filled 2016-05-18: qty 2

## 2016-05-18 MED ORDER — MORPHINE SULFATE (PF) 4 MG/ML IV SOLN
4.0000 mg | Freq: Once | INTRAVENOUS | Status: AC
  Administered 2016-05-19: 4 mg via INTRAVENOUS
  Filled 2016-05-18: qty 1

## 2016-05-18 NOTE — ED Triage Notes (Signed)
patinet comes in from home via [facility] with chest pain left sided, worse with palpation and deep breathing. Patient reports that she had an episode similar a week ago and called EMS but never came to ER. Patient doe shave a hx of remote MI. Patient is a smoker. Patient used to wear O2 but doesn't anymore. Patient was 87% RA. Placed on 2L [place]. EMS gave patient 324mg  aspirin in route.

## 2016-05-18 NOTE — ED Notes (Signed)
ED Provider at bedside. 

## 2016-05-19 ENCOUNTER — Emergency Department: Payer: Medicare HMO

## 2016-05-19 DIAGNOSIS — R079 Chest pain, unspecified: Secondary | ICD-10-CM | POA: Diagnosis not present

## 2016-05-19 LAB — TROPONIN I
Troponin I: <0.03 ng/mL (ref <0.03)
Troponin I: <0.03 ng/mL (ref <0.03)

## 2016-05-19 LAB — FIBRIN DERIVATIVES D-DIMER (ARMC ONLY): Fibrin derivatives D-dimer (ARMC): 888.36 — ABNORMAL HIGH (ref 0.00–499.00)

## 2016-05-19 LAB — BASIC METABOLIC PANEL
Anion gap: 9 (ref 5–15)
BUN: 16 mg/dL (ref 6–20)
CALCIUM: 9 mg/dL (ref 8.9–10.3)
CHLORIDE: 100 mmol/L — AB (ref 101–111)
CO2: 28 mmol/L (ref 22–32)
CREATININE: 0.96 mg/dL (ref 0.44–1.00)
GFR calc Af Amer: >60 mL/min (ref >60)
GFR calc non Af Amer: >60 mL/min (ref >60)
GLUCOSE: 104 mg/dL — AB (ref 65–99)
Potassium: 3.3 mmol/L — ABNORMAL LOW (ref 3.5–5.1)
Sodium: 137 mmol/L (ref 135–145)

## 2016-05-19 LAB — BRAIN NATRIURETIC PEPTIDE: B NATRIURETIC PEPTIDE 5: 17 pg/mL (ref 0.0–100.0)

## 2016-05-19 MED ORDER — KETOROLAC TROMETHAMINE 30 MG/ML IJ SOLN
30.0000 mg | Freq: Once | INTRAMUSCULAR | Status: AC
  Administered 2016-05-19: 30 mg via INTRAVENOUS
  Filled 2016-05-19: qty 1

## 2016-05-19 MED ORDER — IOPAMIDOL (ISOVUE-370) INJECTION 76%
75.0000 mL | Freq: Once | INTRAVENOUS | Status: AC | PRN
  Administered 2016-05-19: 75 mL via INTRAVENOUS

## 2016-05-19 MED ORDER — TRAMADOL HCL 50 MG PO TABS: 50.0000 mg | ORAL_TABLET | Freq: Four times a day (QID) | ORAL | 0 refills | Status: DC | PRN

## 2016-05-19 NOTE — ED Notes (Signed)
Patient transported to X-ray 

## 2016-05-19 NOTE — ED Provider Notes (Signed)
[facility] Provider Note   ____________________________________________   First MD Initiated Contact with Patient 05/18/16 2327     (approximate)  I have reviewed the triage vital signs and the nursing notes.   HISTORY  Chief Complaint Chest Pain    HPI Nicole Austin is a 56 y.o. female who comes into the hospital today with some chest pain. The patient reports this started around 1045. She was sitting watching TV when it started. She feels like squeezing in her chest hurts to breathe. The patient had a heart attack and a stroke a couple of years ago. She called the ambulance last Thursday with the same pain and reports that she was seen and told everything was okay. She was told that time she breathing treatment. She reports that she's been using her inhalers and even took one before her pain started. The patient reports that she's had some shortness of breath but denies lightheadedness and dizziness. She's had nausea with no vomiting as well as some mild sweats. The patient did have a headache today and rates her pain 8 out of 10 in intensity. The patient did receive some aspirin by EMS. She is here today for evaluation of her pain.   Past Medical History:  Diagnosis Date  . ADHD (attention deficit hyperactivity disorder)   . Arthritis   . Asthma   . Bipolar 1 disorder ([place])   . COPD (chronic obstructive pulmonary disease) ([place])   . Depression   . GERD (gastroesophageal reflux disease) 08/30/2015  . History of acute myocardial infarction 06/30/2015   Overview:  [facility]   . Low HDL (under 40) 08/30/2015  . MI (myocardial infarction)   . PTSD (post-traumatic stress disorder)   . Rhabdomyolysis   . Stroke [facility])     Patient Active Problem List   Diagnosis Date Noted  . History of heart attack 05/11/2016  . Neuropathy, peripheral ([place]) 05/07/2016  . Right sided sciatica 04/19/2016  . Pain in right knee 01/05/2016  . Trapezius  muscle spasm 12/17/2015  . Facet hypertrophy of lumbar region 11/25/2015  . Fall at home 11/25/2015  . Abnormal CBC 11/09/2015  . Vitamin B12 deficiency 10/24/2015  . Fatigue 10/04/2015  . Encounter for medication monitoring 10/04/2015  . Hypoalbuminemia 10/04/2015  . Fibromyalgia 08/30/2015  . Low HDL (under 40) 08/30/2015  . GERD (gastroesophageal reflux disease) 08/30/2015  . Other specified behavioral and emotional disorders with onset usually occurring in childhood and adolescence 07/13/2015  . History of acute myocardial infarction 06/30/2015  . OP (osteoporosis) 06/30/2015  . Pain in thoracic spine 06/30/2015  . Personal history of other diseases of the musculoskeletal system and connective tissue 06/30/2015  . Primary malignant neoplasm ([place]) 06/30/2015  . Apnea, sleep 06/30/2015  . Controlled substance agreement signed 06/21/2015  . Other long term (current) drug therapy 06/21/2015  . HLD (hyperlipidemia) 05/13/2015  . Foot ulcer, left ([place]) 04/27/2015  . Chronic pain disorder 04/27/2015  . Chronic pain associated with significant psychosocial dysfunction 04/27/2015  . Non-pressure ulcer of lower extremity ([place]) 04/27/2015  . Stroke ([place]) 03/25/2015  . Cerebral infarction ([place]) 03/25/2015  . Chest pain 12/31/2014  . Morbid obesity ([place]) 12/22/2014  . Bipolar affective disorder, current episode depressed ([place]) 12/22/2014  . Gout 08/16/2014  . Muscle spasms of both lower extremities 08/16/2014  . Spasm 08/16/2014  . Back pain, chronic 08/13/2014  . Low back pain with sciatica 08/04/2014  . Current tobacco use 08/04/2014  . Polysubstance  abuse 07/04/2014  . Psychoactive substance abuse 07/04/2014  . Anxiety, generalized 11/24/2013  . Imbalance 11/24/2013  . Blurred vision 11/24/2013  . Other abnormalities of gait and mobility 11/24/2013  . Unspecified visual disturbance 11/24/2013  . Moderate COPD (chronic obstructive pulmonary disease) ([place]) 11/18/2013  . HPV (human  papilloma virus) infection 07/17/2013  . Arthritis of knee, degenerative 07/15/2013  . Personal history of other specified conditions 07/31/2011    Past Surgical History:  Procedure Laterality Date  . BACK SURGERY    . REPLACEMENT TOTAL KNEE Left     Prior to Admission medications   Medication Sig Start Date End Date Taking? Authorizing Provider  albuterol (PROVENTIL HFA;VENTOLIN HFA) 108 (90 Base) MCG/ACT inhaler Inhale 2 puffs into the lungs every 6 (six) hours as needed for wheezing or shortness of breath. 05/31/15   Orbie Pyo, MD  albuterol (PROVENTIL) (2.5 MG/3ML) 0.083% nebulizer solution Take 3 mLs (2.5 mg total) by nebulization every 4 (four) hours as needed for wheezing or shortness of breath. 03/01/16   Dustin Flock, MD  ALPRAZolam Duanne Moron) 0.5 MG tablet Take 1 tablet (0.5 mg total) by mouth 2 (two) times daily as needed for anxiety. 04/11/16   Rainey Pines, MD  ASPIRIN LOW DOSE 81 MG EC tablet Take 1 tablet by mouth daily. 04/04/16   Historical Provider, MD  atorvastatin (LIPITOR) 40 MG tablet TAKE 1 TABLET BY MOUTH AT BEDTIME 05/15/16   Arnetha Courser, MD  divalproex (DEPAKOTE) 250 MG DR tablet Take 1 tablet (250 mg total) by mouth 2 (two) times daily. 04/11/16   Rainey Pines, MD  escitalopram (LEXAPRO) 20 MG tablet Take 1 tablet (20 mg total) by mouth daily. 04/11/16   Rainey Pines, MD  furosemide (LASIX) 20 MG tablet One by mouth every three days for swelling 05/07/16 06/01/16  Arnetha Courser, MD  Glycopyrrolate-Formoterol (BEVESPI AEROSPHERE) 9-4.8 MCG/ACT AERO Inhale 2 puffs into the lungs 2 (two) times daily. 05/07/16   Arnetha Courser, MD  lidocaine (XYLOCAINE) 5 % ointment Apply 1 application topically as needed for mild pain.    Historical Provider, MD  Liraglutide -Weight Management (SAXENDA) 18 MG/3ML SOPN Inject 0.6 mg into the skin daily. x 1 week, then 1.2 mg daily x 1 week, then 1.8 mg daily x 1 week, then 2.4 mg daily x 1 week, then 3 mg daily 05/07/16   Arnetha Courser,  MD  Multiple Vitamin (MULTIVITAMIN WITH MINERALS) TABS tablet Take 1 tablet by mouth daily.    Historical Provider, MD  potassium chloride (KLOR-CON 10) 10 MEQ tablet One by mouth every three days 05/07/16   Arnetha Courser, MD  pregabalin (LYRICA) 200 MG capsule Take 1 capsule (200 mg total) by mouth 2 (two) times daily. 05/07/16   Arnetha Courser, MD  QUEtiapine (SEROQUEL XR) 300 MG 24 hr tablet Take 1 tablet (300 mg total) by mouth at bedtime. 04/11/16   Rainey Pines, MD  ranitidine (ZANTAC) 150 MG tablet Take 1 tablet (150 mg total) by mouth 2 (two) times daily. 08/29/15   Arnetha Courser, MD  Respiratory Therapy Supplies (NEBULIZER COMPRESSOR) KIT One nebulizer kit/machine with tubing; for use with nebulized albuterol 02/10/16   Arnetha Courser, MD  traMADol (ULTRAM) 50 MG tablet Take 1 tablet (50 mg total) by mouth every 6 (six) hours as needed. 05/19/16   Loney Hering, MD    Allergies Chantix [varenicline] and Codeine  Family History  Problem Relation Age of Onset  . Hernia  Mother   . Heart disease Mother   . OCD Mother   . Diabetes Mother   . Parkinson's disease Father   . Bipolar disorder Sister   . Schizophrenia Sister   . ADD / ADHD Sister   . Alcohol abuse Brother   . Bipolar disorder Sister   . Paranoid behavior Sister   . ADD / ADHD Sister   . ADD / ADHD Son   . Dementia Maternal Grandmother   . Emphysema Maternal Grandfather   . ADD / ADHD Son   . ADD / ADHD Son   . Depression Son     Social History Social History  Substance Use Topics  . Smoking status: Current Every Day Smoker    Packs/day: 1.00    Years: 36.00    Types: Cigarettes    Start date: 10/11/1984  . Smokeless tobacco: Never Used  . Alcohol use No    Review of Systems Constitutional: No fever/chills Eyes: No visual changes. ENT: No sore throat. Cardiovascular: chest pain. Respiratory:  shortness of breath. Gastrointestinal: No abdominal pain.  No nausea, no vomiting.  No diarrhea.  No  constipation. Genitourinary: Negative for dysuria. Musculoskeletal: Negative for back pain. Skin: Negative for rash. Neurological: Headache.  10-point ROS otherwise negative.  ____________________________________________   PHYSICAL EXAM:  VITAL SIGNS: ED Triage Vitals  Enc Vitals Group     BP 05/18/16 2331 104/70     Pulse Rate 05/18/16 2331 80     Resp 05/18/16 2331 14     Temp 05/18/16 2331 98.4 F (36.9 C)     Temp Source 05/18/16 2331 Oral     SpO2 05/18/16 2331 94 %     Weight 05/18/16 2333 273 lb (123.8 kg)     Height --      Head Circumference --      Peak Flow --      Pain Score 05/18/16 2335 8     Pain Loc --      Pain Edu? --      Excl. in [place]? --     Constitutional: Alert and oriented. Well appearing and in mild distress. Eyes: Conjunctivae are normal. PERRL. EOMI. Head: Atraumatic. Nose: No congestion/rhinnorhea. Mouth/Throat: Mucous membranes are moist.  Oropharynx non-erythematous. Cardiovascular: Normal rate, regular rhythm. Grossly normal heart sounds.  Good peripheral circulation. Respiratory: Normal respiratory effort.  No retractions. Crackles in bilateral bases Gastrointestinal: Soft and nontender. No distention. Positive bowel sounds Musculoskeletal: No lower extremity tenderness nor edema.   Neurologic:  Normal speech and language.  Skin:  Skin is warm, dry and intact.  Psychiatric: Mood and affect are normal. Speech and behavior are normal.  ____________________________________________   LABS (all labs ordered are listed, but only abnormal results are displayed)  Labs Reviewed  BASIC METABOLIC PANEL - Abnormal; Notable for the following:       Result Value   Potassium 3.3 (*)    Chloride 100 (*)    Glucose, Bld 104 (*)    All other components within normal limits  FIBRIN DERIVATIVES D-DIMER ([facility] ONLY) - Abnormal; Notable for the following:    Fibrin derivatives D-dimer ([facility]) 888.36 (*)    All other components within normal limits    CBC  TROPONIN I  BRAIN NATRIURETIC PEPTIDE  TROPONIN I   ____________________________________________  EKG  ED ECG REPORT I, Loney Hering, the attending physician, personally viewed and interpreted this ECG.   Date: 05/18/2016  EKG Time: 2327  Rate: 80  Rhythm:  normal sinus rhythm  Axis: normal  Intervals:none  ST&T Change: none  ____________________________________________  RADIOLOGY  CXR CT angio chest ____________________________________________   PROCEDURES  Procedure(s) performed: None  Procedures  Critical Care performed: No  ____________________________________________   INITIAL IMPRESSION / ASSESSMENT AND PLAN / ED COURSE  Pertinent labs & imaging results that were available during my care of the patient were reviewed by me and considered in my medical decision making (see chart for details).  This is a 56 year old female who comes into the hospital today with chest pain. The patient has had an MI in the past. She reports that it feels that someone is squeezing in her chest and does have a pleuritic component to her pain. The patient's blood work is unremarkable but I did send her for a CTA because her d-dimer was elevated. The patient's CTA is negative but she does have some minimal left basilar atelectasis. I will give the patient a DuoNeb and I will repeat the patient's troponin. She did receive some morphine and Zofran for her pain.  Clinical Course as of May 20 435  Sat May 19, 2016  0112 1. No active cardiopulmonary disease identified. 2. Stable cardiomegaly   DG Chest 2 View [AW]  0112 1. No evidence of pulmonary embolus. 2. Minimal left basilar atelectasis noted. Lungs otherwise clear.   CT Angio Chest PE W and/or Wo Contrast [AW]    Clinical Course User Index [AW] Loney Hering, MD   The patient's studies are unremarkable. She did have a repeat troponin that was negative. I did go back into the room and the patient was  sleeping comfortably. She did tell the nurse that her pain was coming back so I will give the patient a shot of Toradol. The patient will be discharged home to follow-up with her primary care physician.  ____________________________________________   FINAL CLINICAL IMPRESSION(S) / ED DIAGNOSES  Final diagnoses:  Chest pain, unspecified type      NEW MEDICATIONS STARTED DURING THIS VISIT:  New Prescriptions   TRAMADOL (ULTRAM) 50 MG TABLET    Take 1 tablet (50 mg total) by mouth every 6 (six) hours as needed.     Note:  This document was prepared using Dragon voice recognition software and may include unintentional dictation errors.    Loney Hering, MD 05/19/16 [phone]

## 2016-05-19 NOTE — ED Notes (Signed)
Went to give patient pain medication x2 patient was in imaging.

## 2016-05-19 NOTE — ED Notes (Signed)
Patient transported to CT 

## 2016-05-19 NOTE — ED Notes (Signed)
Patient states her blood pressure is usually on the "lower" side which is normal for her.

## 2016-05-20 ENCOUNTER — Encounter: Payer: Self-pay | Admitting: Family Medicine

## 2016-05-20 DIAGNOSIS — R7303 Prediabetes: Secondary | ICD-10-CM

## 2016-05-20 HISTORY — DX: Prediabetes: R73.03

## 2016-05-20 NOTE — Assessment & Plan Note (Signed)
Will switch therapy; new Rx sent; f/u in 4 weeks; smoking cessation key

## 2016-05-20 NOTE — Assessment & Plan Note (Signed)
Refer to neurologist; sugars are not high enough that I would expect the monofilament testing to be diminished, and she has other deficits as well

## 2016-05-20 NOTE — Assessment & Plan Note (Addendum)
Patient is frustrated; encouragement given, limit portions, fatty foods, drink enough water, etc; discussed options for medical treatment and she will try Saxenda; Rx sent; close f/u

## 2016-05-20 NOTE — Assessment & Plan Note (Signed)
Encouraged weight loss, healthy eating 

## 2016-05-22 ENCOUNTER — Telehealth: Payer: Self-pay | Admitting: Family Medicine

## 2016-05-22 DIAGNOSIS — L89629 Pressure ulcer of left heel, unspecified stage: Secondary | ICD-10-CM | POA: Diagnosis not present

## 2016-05-22 NOTE — Telephone Encounter (Signed)
Patient notified saxenda and other weight loss supplements is not covered by ins.

## 2016-05-22 NOTE — Telephone Encounter (Signed)
Requesting return call. Received a call pertaining to a medication and prior authorization. She cannot remember the name of the medication however it is a shot. [phone]

## 2016-05-23 ENCOUNTER — Telehealth: Payer: Self-pay | Admitting: Family Medicine

## 2016-05-23 NOTE — Telephone Encounter (Signed)
Left detailed voicemail

## 2016-05-23 NOTE — Telephone Encounter (Signed)
No I'm sorry, but I won't give her tramadol

## 2016-05-23 NOTE — Telephone Encounter (Signed)
Pt states she went to the ER on 05/18/16, she thought she was having a heartattach. Pt states they gave her 14 Tramadol and she is going out out town and wants to know if Dr Sanda Klein would be willing to give her Crista Elliot so she can have some for when she goes out of town. Pain is in her chest on left side. Pt has an appt with cardiologist on 06/06/16.

## 2016-05-24 ENCOUNTER — Ambulatory Visit: Payer: Commercial Managed Care - HMO | Admitting: Internal Medicine

## 2016-05-31 ENCOUNTER — Encounter: Payer: Self-pay | Admitting: *Deleted

## 2016-05-31 ENCOUNTER — Other Ambulatory Visit: Payer: Self-pay | Admitting: *Deleted

## 2016-05-31 NOTE — Patient Outreach (Signed)
Plan to close pt's case as 3 phone call attempts made to follow up on self management of COPD, no response so unable to contact letter sent- no response.    Plan: per [facility] workflow, to close case.          Plan to notify Dr. Sanda Klein of pt's discharge from community nurse case management services,              Send case closure letter by in basket.           Inform [facility] care management assistant to close case.    Zara Chess.   [facility]  [phone]

## 2016-06-04 ENCOUNTER — Telehealth: Payer: Self-pay

## 2016-06-04 DIAGNOSIS — M5431 Sciatica, right side: Secondary | ICD-10-CM

## 2016-06-04 NOTE — Telephone Encounter (Signed)
Pt stated she would like to be refereed to [facility] in [place]. Pt states she could use the pool at this location and this could help her legs since she has sciatica. The referral that you put in on 04/19/2016 she never went. She states she can get a ride at that location as well since her roommate is a pt there as well.

## 2016-06-04 NOTE — Telephone Encounter (Signed)
Great idea Referral entered Thank you

## 2016-06-05 ENCOUNTER — Telehealth: Payer: Self-pay

## 2016-06-05 NOTE — Progress Notes (Deleted)
Cardiology Office Note  Date:  06/05/2016   ID:  Ahriana Gunkel, DOB [birth year], MRN [id number]  PCP:  Enid Derry, MD   No chief complaint on file.   HPI:  Ms. Whitworth is a 56 year old woman with  morbid obesity,  COPD,  continues to smoke more than one pack per day,  bipolar, PTSD per the notes,  Hx of chest pain at rest, atyjpical Previous CT scan with no CAD Chronic leg swelling who presents for evaluation of chest pain, SOB  Evaluated in the hospital 02/27/2016 Hospital records reviewed with the patient in detail worsening shortness of breath and wheezing for 1 week, productive cough.  acute on chronic COPD exaceberation,  acute bronchitis.  treated with antibiotics nebulizer and steroids .  pain in her abdomen  Due to coughing.   Seen in the ER for chest pain, 05/18/2016 1045 am watching TV,  squeezing in her chest hurts to breathe  headache today and rates her pain 8 out of 10 in intensity.  CT with no PE (el;evated d-dimer)  May 2016 in the hospital for encephalopathy, possibly secondary to pain medication She had left-sided weakness that improved  MRI did not show stroke  She presents in a wheelchair, not on oxygen in no distress  Reports having chest pain sometimes at rest, does not last very long  Has been otherwise very active, shopping, spending time with her grandchildren On her feet for long periods of time, causing some leg swelling Swelling is been a chronic issue, sometimes wears compression hose  EKG personally reviewed by myself on todays visit  shows normal sinus rhythm with rate 95 bpm, no significant ST or T-wave changes  CT angiogram of the chest  This shows no significant coronary artery atherosclerosis, minimal aortic plaquing at the level of the ostial carotids, otherwise no significant atherosclerotic plaque through her aorta     PMH:   has a past medical history of ADHD (attention deficit hyperactivity disorder); Arthritis; Asthma;  Bipolar 1 disorder ([place]); COPD (chronic obstructive pulmonary disease) ([place]); Depression; GERD (gastroesophageal reflux disease) (08/30/2015); History of acute myocardial infarction (06/30/2015); Low HDL (under 40) (08/30/2015); MI (myocardial infarction); Prediabetes (05/20/2016); PTSD (post-traumatic stress disorder); Rhabdomyolysis; and Stroke ([place]).  PSH:    Past Surgical History:  Procedure Laterality Date  . BACK SURGERY    . REPLACEMENT TOTAL KNEE Left     Current Outpatient Prescriptions  Medication Sig Dispense Refill  . albuterol (PROVENTIL HFA;VENTOLIN HFA) 108 (90 Base) MCG/ACT inhaler Inhale 2 puffs into the lungs every 6 (six) hours as needed for wheezing or shortness of breath. 1 Inhaler 0  . albuterol (PROVENTIL) (2.5 MG/3ML) 0.083% nebulizer solution Take 3 mLs (2.5 mg total) by nebulization every 4 (four) hours as needed for wheezing or shortness of breath. 75 mL 2  . ALPRAZolam (XANAX) 0.5 MG tablet Take 1 tablet (0.5 mg total) by mouth 2 (two) times daily as needed for anxiety. 60 tablet 2  . ASPIRIN LOW DOSE 81 MG EC tablet Take 1 tablet by mouth daily.    Marland Kitchen atorvastatin (LIPITOR) 40 MG tablet TAKE 1 TABLET BY MOUTH AT BEDTIME 30 tablet 3  . divalproex (DEPAKOTE) 250 MG DR tablet Take 1 tablet (250 mg total) by mouth 2 (two) times daily. 60 tablet 2  . escitalopram (LEXAPRO) 20 MG tablet Take 1 tablet (20 mg total) by mouth daily. 30 tablet 2  . furosemide (LASIX) 20 MG tablet One by mouth every three days for swelling 10  tablet 2  . Glycopyrrolate-Formoterol (BEVESPI AEROSPHERE) 9-4.8 MCG/ACT AERO Inhale 2 puffs into the lungs 2 (two) times daily. 1 Inhaler 11  . lidocaine (XYLOCAINE) 5 % ointment Apply 1 application topically as needed for mild pain.    . Liraglutide -Weight Management (SAXENDA) 18 MG/3ML SOPN Inject 0.6 mg into the skin daily. x 1 week, then 1.2 mg daily x 1 week, then 1.8 mg daily x 1 week, then 2.4 mg daily x 1 week, then 3 mg daily 3 mL 0  . Multiple Vitamin  (MULTIVITAMIN WITH MINERALS) TABS tablet Take 1 tablet by mouth daily.    . potassium chloride (KLOR-CON 10) 10 MEQ tablet One by mouth every three days 10 tablet 2  . pregabalin (LYRICA) 200 MG capsule Take 1 capsule (200 mg total) by mouth 2 (two) times daily. 60 capsule 2  . QUEtiapine (SEROQUEL XR) 300 MG 24 hr tablet Take 1 tablet (300 mg total) by mouth at bedtime. 30 tablet 2  . ranitidine (ZANTAC) 150 MG tablet Take 1 tablet (150 mg total) by mouth 2 (two) times daily. 60 tablet 2  . Respiratory Therapy Supplies (NEBULIZER COMPRESSOR) KIT One nebulizer kit/machine with tubing; for use with nebulized albuterol 1 each 0  . traMADol (ULTRAM) 50 MG tablet Take 1 tablet (50 mg total) by mouth every 6 (six) hours as needed. 12 tablet 0   No current facility-administered medications for this visit.      Allergies:   Chantix [varenicline] and Codeine   Social History:  The patient  reports that she has been smoking Cigarettes.  She started smoking about 31 years ago. She has a 36.00 pack-year smoking history. She has never used smokeless tobacco. She reports that she does not drink alcohol or use drugs.   Family History:   family history includes ADD / ADHD in her sister, sister, son, son, and son; Alcohol abuse in her brother; Bipolar disorder in her sister and sister; Dementia in her maternal grandmother; Depression in her son; Diabetes in her mother; Emphysema in her maternal grandfather; Heart disease in her mother; Hernia in her mother; OCD in her mother; Paranoid behavior in her sister; Parkinson's disease in her father; Schizophrenia in her sister.    Review of Systems: ROS   PHYSICAL EXAM: VS:  LMP  (LMP Unknown)  , BMI There is no height or weight on file to calculate BMI. GEN: Well nourished, well developed, in no acute distress  HEENT: normal  Neck: no JVD, carotid bruits, or masses Cardiac: RRR; no murmurs, rubs, or gallops,no edema  Respiratory:  clear to auscultation  bilaterally, normal work of breathing GI: soft, nontender, nondistended, + BS MS: no deformity or atrophy  Skin: warm and dry, no rash Neuro:  Strength and sensation are intact Psych: euthymic mood, full affect    Recent Labs: 10/04/2015: ALT 19; TSH 1.90 05/18/2016: B Natriuretic Peptide 17.0; BUN 16; Creatinine, Ser 0.96; Hemoglobin 14.0; Platelets 185; Potassium 3.3; Sodium 137    Lipid Panel Lab Results  Component Value Date   CHOL 140 10/04/2015   HDL 41 (L) 10/04/2015   LDLCALC 49 10/04/2015   TRIG 248 (H) 10/04/2015      Wt Readings from Last 3 Encounters:  05/18/16 273 lb (123.8 kg)  05/07/16 273 lb 9.6 oz (124.1 kg)  04/03/16 273 lb (123.8 kg)       ASSESSMENT AND PLAN:  No diagnosis found.   Disposition:   F/U  6 months  No orders of  the defined types were placed in this encounter.    Signed, Esmond Plants, M.D., Ph.D. 06/05/2016  [place], [place]

## 2016-06-05 NOTE — Telephone Encounter (Signed)
error 

## 2016-06-06 ENCOUNTER — Ambulatory Visit: Payer: Commercial Managed Care - HMO | Admitting: Cardiovascular Disease

## 2016-06-07 ENCOUNTER — Ambulatory Visit: Payer: Self-pay | Admitting: Family Medicine

## 2016-06-12 ENCOUNTER — Other Ambulatory Visit: Payer: Self-pay | Admitting: Family Medicine

## 2016-06-12 NOTE — Telephone Encounter (Signed)
Confirmed with pharmacy.

## 2016-06-12 NOTE — Telephone Encounter (Signed)
Another request for atorvastatin received Receipt confirmed by [facility] in March; please resolve with pharmacy

## 2016-06-13 ENCOUNTER — Ambulatory Visit: Payer: Self-pay | Admitting: Family Medicine

## 2016-06-17 ENCOUNTER — Other Ambulatory Visit: Payer: Self-pay | Admitting: Psychiatry

## 2016-06-18 NOTE — Progress Notes (Deleted)
Cardiology Office Note  Date:  06/18/2016   ID:  Britley, Gashi April 20, 1960, MRN [id number]  PCP:  Enid Derry, MD   No chief complaint on file.  Patient did not show up for her visit today Notes below were precharted in preparation for her visit  HPI:  Ms. Storts is a 56 year old woman with  morbid obesity,  COPD,  continues to smoke more than one pack per day, bipolar,  PTSD per the notes,  Carotid u/s 2017 with minimal dz Previous CT angiogram of the chest no significant coronary artery atherosclerosis, minimal aortic plaquing at the level of the ostial carotids, otherwise no significant atherosclerotic plaque through her aorta who presents for evaluation of chest pain  In the ER 04/2016 with chest pain Work up negative   May 2016 shows she had encephalopathy, possibly secondary to pain medication She had left-sided weakness that improved with further monitoring MRI did not show stroke  Reports having chest pain sometimes at rest, does not last very long Several trips to the emergency room for various issues  Has been otherwise very active, shopping, spending time with her grandchildren On her feet for long periods of time, causing some leg swelling Swelling is been a chronic issue, sometimes wears compression hose  EKG personally reviewed by myself on todays visit  shows normal sinus rhythm with rate 95 bpm, no significant ST or T-wave changes   PMH:   has a past medical history of ADHD (attention deficit hyperactivity disorder); Arthritis; Asthma; Bipolar 1 disorder ([place]); COPD (chronic obstructive pulmonary disease) ([place]); Depression; GERD (gastroesophageal reflux disease) (08/30/2015); History of acute myocardial infarction (06/30/2015); Low HDL (under 40) (08/30/2015); MI (myocardial infarction); Prediabetes (05/20/2016); PTSD (post-traumatic stress disorder); Rhabdomyolysis; and Stroke ([place]).  PSH:    Past Surgical History:  Procedure Laterality Date  . BACK  SURGERY    . REPLACEMENT TOTAL KNEE Left     Current Outpatient Prescriptions  Medication Sig Dispense Refill  . albuterol (PROVENTIL HFA;VENTOLIN HFA) 108 (90 Base) MCG/ACT inhaler Inhale 2 puffs into the lungs every 6 (six) hours as needed for wheezing or shortness of breath. 1 Inhaler 0  . albuterol (PROVENTIL) (2.5 MG/3ML) 0.083% nebulizer solution Take 3 mLs (2.5 mg total) by nebulization every 4 (four) hours as needed for wheezing or shortness of breath. 75 mL 2  . ALPRAZolam (XANAX) 0.5 MG tablet Take 1 tablet (0.5 mg total) by mouth 2 (two) times daily as needed for anxiety. 60 tablet 2  . ASPIRIN LOW DOSE 81 MG EC tablet Take 1 tablet by mouth daily.    Marland Kitchen atorvastatin (LIPITOR) 40 MG tablet TAKE 1 TABLET BY MOUTH AT BEDTIME 30 tablet 3  . divalproex (DEPAKOTE) 250 MG DR tablet Take 1 tablet (250 mg total) by mouth 2 (two) times daily. 60 tablet 2  . escitalopram (LEXAPRO) 20 MG tablet Take 1 tablet (20 mg total) by mouth daily. 30 tablet 2  . furosemide (LASIX) 20 MG tablet One by mouth every three days for swelling 10 tablet 2  . Glycopyrrolate-Formoterol (BEVESPI AEROSPHERE) 9-4.8 MCG/ACT AERO Inhale 2 puffs into the lungs 2 (two) times daily. 1 Inhaler 11  . lidocaine (XYLOCAINE) 5 % ointment Apply 1 application topically as needed for mild pain.    . Liraglutide -Weight Management (SAXENDA) 18 MG/3ML SOPN Inject 0.6 mg into the skin daily. x 1 week, then 1.2 mg daily x 1 week, then 1.8 mg daily x 1 week, then 2.4 mg daily x 1 week,  then 3 mg daily 3 mL 0  . Multiple Vitamin (MULTIVITAMIN WITH MINERALS) TABS tablet Take 1 tablet by mouth daily.    . potassium chloride (KLOR-CON 10) 10 MEQ tablet One by mouth every three days 10 tablet 2  . pregabalin (LYRICA) 200 MG capsule Take 1 capsule (200 mg total) by mouth 2 (two) times daily. 60 capsule 2  . QUEtiapine (SEROQUEL XR) 300 MG 24 hr tablet Take 1 tablet (300 mg total) by mouth at bedtime. 30 tablet 2  . ranitidine (ZANTAC) 150 MG  tablet Take 1 tablet (150 mg total) by mouth 2 (two) times daily. 60 tablet 2  . Respiratory Therapy Supplies (NEBULIZER COMPRESSOR) KIT One nebulizer kit/machine with tubing; for use with nebulized albuterol 1 each 0  . traMADol (ULTRAM) 50 MG tablet Take 1 tablet (50 mg total) by mouth every 6 (six) hours as needed. 12 tablet 0   No current facility-administered medications for this visit.      Allergies:   Chantix [varenicline] and Codeine   Social History:  The patient  reports that she has been smoking Cigarettes.  She started smoking about 31 years ago. She has a 36.00 pack-year smoking history. She has never used smokeless tobacco. She reports that she does not drink alcohol or use drugs.   Family History:   family history includes ADD / ADHD in her sister, sister, son, son, and son; Alcohol abuse in her brother; Bipolar disorder in her sister and sister; Dementia in her maternal grandmother; Depression in her son; Diabetes in her mother; Emphysema in her maternal grandfather; Heart disease in her mother; Hernia in her mother; OCD in her mother; Paranoid behavior in her sister; Parkinson's disease in her father; Schizophrenia in her sister.    Review of Systems: ROS   PHYSICAL EXAM: VS:  LMP  (LMP Unknown)  , BMI There is no height or weight on file to calculate BMI. GEN: Well nourished, well developed, in no acute distress  HEENT: normal  Neck: no JVD, carotid bruits, or masses Cardiac: RRR; no murmurs, rubs, or gallops,no edema  Respiratory:  clear to auscultation bilaterally, normal work of breathing GI: soft, nontender, nondistended, + BS MS: no deformity or atrophy  Skin: warm and dry, no rash Neuro:  Strength and sensation are intact Psych: euthymic mood, full affect    Recent Labs: 10/04/2015: ALT 19; TSH 1.90 05/18/2016: B Natriuretic Peptide 17.0; BUN 16; Creatinine, Ser 0.96; Hemoglobin 14.0; Platelets 185; Potassium 3.3; Sodium 137    Lipid Panel Lab Results   Component Value Date   CHOL 140 10/04/2015   HDL 41 (L) 10/04/2015   LDLCALC 49 10/04/2015   TRIG 248 (H) 10/04/2015      Wt Readings from Last 3 Encounters:  05/18/16 273 lb (123.8 kg)  05/07/16 273 lb 9.6 oz (124.1 kg)  04/03/16 273 lb (123.8 kg)       ASSESSMENT AND PLAN:  No diagnosis found.   Disposition:   F/U  6 months  No orders of the defined types were placed in this encounter.    Signed, Esmond Plants, M.D., Ph.D. 06/18/2016  [place], [place]

## 2016-06-19 ENCOUNTER — Ambulatory Visit: Payer: Commercial Managed Care - HMO | Admitting: Cardiovascular Disease

## 2016-06-20 ENCOUNTER — Encounter: Payer: Self-pay | Admitting: Cardiovascular Disease

## 2016-07-04 ENCOUNTER — Other Ambulatory Visit: Payer: Self-pay | Admitting: Family Medicine

## 2016-07-04 ENCOUNTER — Other Ambulatory Visit: Payer: Self-pay | Admitting: Psychiatry

## 2016-07-04 ENCOUNTER — Telehealth: Payer: Self-pay

## 2016-07-04 DIAGNOSIS — G894 Chronic pain syndrome: Secondary | ICD-10-CM

## 2016-07-04 MED ORDER — DIVALPROEX SODIUM 250 MG PO DR TAB: 250.0000 mg | DELAYED_RELEASE_TABLET | Freq: Two times a day (BID) | ORAL | 2 refills | Status: DC

## 2016-07-04 MED ORDER — ALPRAZOLAM 0.25 MG PO TABS: 0.2500 mg | ORAL_TABLET | Freq: Two times a day (BID) | ORAL | 0 refills | Status: DC | PRN

## 2016-07-04 MED ORDER — QUETIAPINE FUMARATE ER 300 MG PO TB24: 300.0000 mg | ORAL_TABLET | Freq: Every day | ORAL | 2 refills | Status: DC

## 2016-07-04 NOTE — Telephone Encounter (Signed)
Meds refilled till next appt

## 2016-07-04 NOTE — Telephone Encounter (Signed)
pharmacy faxed a refill request for divalproes sodium dr 250mg  . pt will not have enough to get to next appt.  pt was last seen on 04-11-16 next appt 07-11-16

## 2016-07-04 NOTE — Telephone Encounter (Signed)
faxed and confirmed rx for xanax .25 mg id # [id number] order # [id number] #[id number] no refills.

## 2016-07-04 NOTE — Telephone Encounter (Signed)
Okay for naproxen Her potassium was low, creatinine bumped, so I thought lasix was weaned off Too early for lyrica Last filled on 06/10/16 so next due 07/10/16

## 2016-07-09 ENCOUNTER — Ambulatory Visit: Payer: Self-pay | Admitting: Psychiatry

## 2016-07-09 ENCOUNTER — Encounter: Payer: Self-pay | Admitting: Emergency Medicine

## 2016-07-09 ENCOUNTER — Emergency Department
Admission: EM | Admit: 2016-07-09 | Discharge: 2016-07-09 | Disposition: A | Discharge status: Home / Self Care | Payer: Medicare HMO | Attending: Emergency Medicine | Admitting: Emergency Medicine

## 2016-07-09 ENCOUNTER — Emergency Department: Payer: Medicare HMO

## 2016-07-09 DIAGNOSIS — S99921A Unspecified injury of right foot, initial encounter: Secondary | ICD-10-CM | POA: Diagnosis not present

## 2016-07-09 DIAGNOSIS — Y929 Unspecified place or not applicable: Secondary | ICD-10-CM | POA: Diagnosis not present

## 2016-07-09 DIAGNOSIS — F17210 Nicotine dependence, cigarettes, uncomplicated: Secondary | ICD-10-CM | POA: Diagnosis not present

## 2016-07-09 DIAGNOSIS — M79604 Pain in right leg: Secondary | ICD-10-CM

## 2016-07-09 DIAGNOSIS — Z7982 Long term (current) use of aspirin: Secondary | ICD-10-CM | POA: Insufficient documentation

## 2016-07-09 DIAGNOSIS — S93401A Sprain of unspecified ligament of right ankle, initial encounter: Secondary | ICD-10-CM

## 2016-07-09 DIAGNOSIS — J45909 Unspecified asthma, uncomplicated: Secondary | ICD-10-CM | POA: Diagnosis not present

## 2016-07-09 DIAGNOSIS — X501XXA Overexertion from prolonged static or awkward postures, initial encounter: Secondary | ICD-10-CM | POA: Diagnosis not present

## 2016-07-09 DIAGNOSIS — F909 Attention-deficit hyperactivity disorder, unspecified type: Secondary | ICD-10-CM | POA: Insufficient documentation

## 2016-07-09 DIAGNOSIS — Y939 Activity, unspecified: Secondary | ICD-10-CM | POA: Insufficient documentation

## 2016-07-09 DIAGNOSIS — S99911A Unspecified injury of right ankle, initial encounter: Secondary | ICD-10-CM | POA: Diagnosis not present

## 2016-07-09 DIAGNOSIS — J449 Chronic obstructive pulmonary disease, unspecified: Secondary | ICD-10-CM | POA: Insufficient documentation

## 2016-07-09 DIAGNOSIS — S8991XA Unspecified injury of right lower leg, initial encounter: Secondary | ICD-10-CM | POA: Diagnosis not present

## 2016-07-09 DIAGNOSIS — Z79899 Other long term (current) drug therapy: Secondary | ICD-10-CM | POA: Insufficient documentation

## 2016-07-09 DIAGNOSIS — M25571 Pain in right ankle and joints of right foot: Secondary | ICD-10-CM | POA: Diagnosis not present

## 2016-07-09 DIAGNOSIS — Y999 Unspecified external cause status: Secondary | ICD-10-CM | POA: Insufficient documentation

## 2016-07-09 MED ORDER — MELOXICAM 15 MG PO TABS: 15.0000 mg | ORAL_TABLET | Freq: Every day | ORAL | 0 refills | Status: DC

## 2016-07-09 MED ORDER — TRAMADOL HCL 50 MG PO TABS: 50.0000 mg | ORAL_TABLET | Freq: Four times a day (QID) | ORAL | 0 refills | Status: DC | PRN

## 2016-07-09 NOTE — ED Triage Notes (Signed)
R ankle pain since twisted yesterday.

## 2016-07-09 NOTE — ED Provider Notes (Signed)
[facility] Emergency Department Provider Note ____________________________________________  Time seen: Approximately 1:02 PM  I have reviewed the triage vital signs and the nursing notes.   HISTORY  Chief Complaint Ankle Pain    HPI Nicole Austin is a 56 y.o. female who presents to the emergency department for evaluation of right foot, ankle, and knee pain after stepping off of a [employer] trailer yesterday. She has had a previous injury to the same about 2 years ago. No relief with naprosyn.  Past Medical History:  Diagnosis Date  . ADHD (attention deficit hyperactivity disorder)   . Arthritis   . Asthma   . Bipolar 1 disorder ([place])   . COPD (chronic obstructive pulmonary disease) ([place])   . Depression   . GERD (gastroesophageal reflux disease) 08/30/2015  . History of acute myocardial infarction 06/30/2015   Overview:  [facility]   . Low HDL (under 40) 08/30/2015  . MI (myocardial infarction) ([place])   . Prediabetes 05/20/2016  . PTSD (post-traumatic stress disorder)   . Rhabdomyolysis   . Stroke [facility])     Patient Active Problem List   Diagnosis Date Noted  . Prediabetes 05/20/2016  . Neuropathy, peripheral 05/07/2016  . Right sided sciatica 04/19/2016  . Pain in right knee 01/05/2016  . Trapezius muscle spasm 12/17/2015  . Facet hypertrophy of lumbar region 11/25/2015  . Fall at home 11/25/2015  . Abnormal CBC 11/09/2015  . Vitamin B12 deficiency 10/24/2015  . Fatigue 10/04/2015  . Encounter for medication monitoring 10/04/2015  . Hypoalbuminemia 10/04/2015  . Fibromyalgia 08/30/2015  . Low HDL (under 40) 08/30/2015  . GERD (gastroesophageal reflux disease) 08/30/2015  . Other specified behavioral and emotional disorders with onset usually occurring in childhood and adolescence 07/13/2015  . OP (osteoporosis) 06/30/2015  . Pain in thoracic spine 06/30/2015  . Personal history of other diseases of the musculoskeletal system and connective tissue  06/30/2015  . Primary malignant neoplasm ([place]) 06/30/2015  . Apnea, sleep 06/30/2015  . Controlled substance agreement signed 06/21/2015  . Other long term (current) drug therapy 06/21/2015  . HLD (hyperlipidemia) 05/13/2015  . Foot ulcer, left ([place]) 04/27/2015  . Chronic pain disorder 04/27/2015  . Chronic pain associated with significant psychosocial dysfunction 04/27/2015  . Non-pressure ulcer of lower extremity ([place]) 04/27/2015  . Stroke ([place]) 03/25/2015  . Cerebral infarction ([place]) 03/25/2015  . Chest pain 12/31/2014  . Morbid obesity ([place]) 12/22/2014  . Bipolar affective disorder, current episode depressed ([place]) 12/22/2014  . Gout 08/16/2014  . Muscle spasms of both lower extremities 08/16/2014  . Spasm 08/16/2014  . Back pain, chronic 08/13/2014  . Low back pain with sciatica 08/04/2014  . Current tobacco use 08/04/2014  . Polysubstance abuse 07/04/2014  . Psychoactive substance abuse 07/04/2014  . Anxiety, generalized 11/24/2013  . Imbalance 11/24/2013  . Other abnormalities of gait and mobility 11/24/2013  . Moderate COPD (chronic obstructive pulmonary disease) ([place]) 11/18/2013  . HPV (human papilloma virus) infection 07/17/2013  . Arthritis of knee, degenerative 07/15/2013  . Personal history of other specified conditions 07/31/2011    Past Surgical History:  Procedure Laterality Date  . BACK SURGERY    . REPLACEMENT TOTAL KNEE Left     Prior to Admission medications   Medication Sig Start Date End Date Taking? Authorizing Provider  albuterol (PROVENTIL HFA;VENTOLIN HFA) 108 (90 Base) MCG/ACT inhaler Inhale 2 puffs into the lungs every 6 (six) hours as needed for wheezing or shortness of breath. 05/31/15   Schaevitz, Randall An,  MD  albuterol (PROVENTIL) (2.5 MG/3ML) 0.083% nebulizer solution Take 3 mLs (2.5 mg total) by nebulization every 4 (four) hours as needed for wheezing or shortness of breath. 03/01/16   Dustin Flock, MD  ALPRAZolam Duanne Moron) 0.25 MG tablet  Take 1 tablet (0.25 mg total) by mouth 2 (two) times daily as needed for anxiety. 07/04/16   Rainey Pines, MD  ASPIRIN LOW DOSE 81 MG EC tablet Take 1 tablet by mouth daily. 04/04/16   [provider]  atorvastatin (LIPITOR) 40 MG tablet TAKE 1 TABLET BY MOUTH AT BEDTIME 05/15/16   Lada, Satira Anis, MD  divalproex (DEPAKOTE) 250 MG DR tablet Take 1 tablet (250 mg total) by mouth 2 (two) times daily. 07/04/16   Rainey Pines, MD  escitalopram (LEXAPRO) 20 MG tablet Take 1 tablet (20 mg total) by mouth daily. 04/11/16   Rainey Pines, MD  furosemide (LASIX) 20 MG tablet One by mouth every three days for swelling 05/07/16 06/01/16  Lada, Satira Anis, MD  Glycopyrrolate-Formoterol (BEVESPI AEROSPHERE) 9-4.8 MCG/ACT AERO Inhale 2 puffs into the lungs 2 (two) times daily. 05/07/16   Lada, Satira Anis, MD  lidocaine (XYLOCAINE) 5 % ointment Apply 1 application topically as needed for mild pain.    [provider]  Liraglutide -Weight Management (SAXENDA) 18 MG/3ML SOPN Inject 0.6 mg into the skin daily. x 1 week, then 1.2 mg daily x 1 week, then 1.8 mg daily x 1 week, then 2.4 mg daily x 1 week, then 3 mg daily 05/07/16   Lada, Satira Anis, MD  meloxicam (MOBIC) 15 MG tablet Take 1 tablet (15 mg total) by mouth daily. 07/09/16   Lana Flaim, Johnette Abraham B, FNP  Multiple Vitamin (MULTIVITAMIN WITH MINERALS) TABS tablet Take 1 tablet by mouth daily.    [provider]  naproxen (NAPROSYN) 375 MG tablet Take 1 tablet (375 mg total) by mouth 2 (two) times daily as needed. (take aspirin at least one full hour before first naproxen of the day) 07/04/16   Lada, Satira Anis, MD  potassium chloride (KLOR-CON 10) 10 MEQ tablet One by mouth every three days 05/07/16   Arnetha Courser, MD  pregabalin (LYRICA) 200 MG capsule Take 1 capsule (200 mg total) by mouth 2 (two) times daily. 05/07/16   Arnetha Courser, MD  QUEtiapine (SEROQUEL XR) 300 MG 24 hr tablet Take 1 tablet (300 mg total) by mouth at bedtime. 07/04/16   Rainey Pines, MD   ranitidine (ZANTAC) 150 MG tablet Take 1 tablet (150 mg total) by mouth 2 (two) times daily. 08/29/15   Arnetha Courser, MD  Respiratory Therapy Supplies (NEBULIZER COMPRESSOR) KIT One nebulizer kit/machine with tubing; for use with nebulized albuterol 02/10/16   Lada, Satira Anis, MD  traMADol (ULTRAM) 50 MG tablet Take 1 tablet (50 mg total) by mouth every 6 (six) hours as needed. 07/09/16   Victorino Dike, FNP    Allergies Chantix [varenicline]  Family History  Problem Relation Age of Onset  . Hernia Mother   . Heart disease Mother   . OCD Mother   . Diabetes Mother   . Parkinson's disease Father   . Bipolar disorder Sister   . Schizophrenia Sister   . ADD / ADHD Sister   . Alcohol abuse Brother   . Bipolar disorder Sister   . Paranoid behavior Sister   . ADD / ADHD Sister   . ADD / ADHD Son   . Dementia Maternal Grandmother   . Emphysema Maternal Grandfather   .  ADD / ADHD Son   . ADD / ADHD Son   . Depression Son     Social History Social History  Substance Use Topics  . Smoking status: Current Every Day Smoker    Packs/day: 1.00    Years: 36.00    Types: Cigarettes    Start date: 10/11/1984  . Smokeless tobacco: Never Used  . Alcohol use No    Review of Systems Constitutional: No recent illness. Cardiovascular: Denies chest pain or palpitations. Respiratory: Denies shortness of breath. Musculoskeletal: Pain in right foot, ankle, and knee. Skin: Negative for rash, wound, lesion. Neurological: Negative for focal weakness or numbness.  ____________________________________________   PHYSICAL EXAM:  VITAL SIGNS: ED Triage Vitals [07/09/16 1245]  Enc Vitals Group     BP 107/67     Pulse Rate 87     Resp 20     Temp 98.1 F (36.7 C)     Temp Source Oral     SpO2 94 %     Weight 270 lb (122.5 kg)     Height _0  (1.549 m)     Head Circumference      Peak Flow      Pain Score 10     Pain Loc      Pain Edu?      Excl. in [place]?     Constitutional:  Alert and oriented. Well appearing and in no acute distress. Eyes: Conjunctivae are normal. EOMI. Head: Atraumatic. Neck: No stridor.  Respiratory: Normal respiratory effort.   Musculoskeletal: Diffuse tenderness and swelling of the right foot and ankle. [place] ankle rules are positive for tenderness over the distal tibiofibular syndesmosis. Neurologic:  Normal speech and language. No gross focal neurologic deficits are appreciated. Speech is normal. No gait instability. Skin:  Skin is warm, dry and intact. Atraumatic. Psychiatric: Mood and affect are normal. Speech and behavior are normal.  ____________________________________________   LABS (all labs ordered are listed, but only abnormal results are displayed)  Labs Reviewed - No data to display ____________________________________________  RADIOLOGY  X-ray of the right ankle, foot, and knee are negative for acute bony abnormality per radiology. ____________________________________________   PROCEDURES  Procedure(s) performed: Ankle stirrup splint applied by ER tech. Patient neurovascularly intact post-application.  ____________________________________________   INITIAL IMPRESSION / ASSESSMENT AND PLAN / ED COURSE  56 year old female presenting to the emergency department for evaluation of right foot, ankle, and knee pain after a twisting injury yesterday evening. Exam concerning for fracture based on the [place] ankle rules, however x-ray does not reveal any displacement or acute fracture. Ankle stirrup splint and crutches were given in the emergency department and she was advised to see her podiatrist which is Dr. Elvina Mattes in about a week for follow-up. She was advised to rest, ice, and elevate her leg as often as possible. She will be given a prescription for meloxicam and tramadol. She was advised to return to the emergency department for symptoms that change or worsen if she is unable to schedule an appointment with either her  primary care provider or the specialist.  Pertinent labs & imaging results that were available during my care of the patient were reviewed by me and considered in my medical decision making (see chart for details).  _________________________________________   FINAL CLINICAL IMPRESSION(S) / ED DIAGNOSES  Final diagnoses:  Sprain of right ankle, unspecified ligament, initial encounter  Musculoskeletal pain of lower extremity, right    Discharge Medication List as of 07/09/2016  2:47 PM  START taking these medications   Details  meloxicam (MOBIC) 15 MG tablet Take 1 tablet (15 mg total) by mouth daily., Starting Mon 07/09/2016, Print        If controlled substance prescribed during this visit, 12 month history viewed on the [place] prior to issuing an initial prescription for Schedule II or III opiod.    Victorino Dike, FNP 07/09/16 1637    Lavonia Drafts, MD 07/11/16 1323

## 2016-07-09 NOTE — ED Notes (Signed)
See triage note  States she twisted her right ankle yesterday  Min swelling  Positive pulses   Unable to bear full wt

## 2016-07-11 ENCOUNTER — Ambulatory Visit: Payer: Commercial Managed Care - HMO | Admitting: Psychiatry

## 2016-07-17 ENCOUNTER — Other Ambulatory Visit: Payer: Self-pay | Admitting: Psychiatry

## 2016-07-18 NOTE — Telephone Encounter (Signed)
Pt has not been seen since Feb. Need appt for medication refill. She has already received x1 refill of xanax.

## 2016-07-30 ENCOUNTER — Encounter: Payer: Self-pay | Admitting: Psychiatry

## 2016-07-30 ENCOUNTER — Ambulatory Visit (INDEPENDENT_AMBULATORY_CARE_PROVIDER_SITE_OTHER): Payer: Commercial Managed Care - HMO | Admitting: Psychiatry

## 2016-07-30 VITALS — BP 124/77 | HR 81 | Temp 98.4°F | Wt 266.8 lb

## 2016-07-30 DIAGNOSIS — F3130 Bipolar disorder, current episode depressed, mild or moderate severity, unspecified: Secondary | ICD-10-CM

## 2016-07-30 DIAGNOSIS — F4310 Post-traumatic stress disorder, unspecified: Secondary | ICD-10-CM

## 2016-07-30 MED ORDER — HYDROXYZINE HCL 10 MG PO TABS: 10.0000 mg | ORAL_TABLET | Freq: Two times a day (BID) | ORAL | 1 refills | Status: DC | PRN

## 2016-07-30 MED ORDER — DIVALPROEX SODIUM 250 MG PO DR TAB: 250.0000 mg | DELAYED_RELEASE_TABLET | Freq: Two times a day (BID) | ORAL | 2 refills | Status: DC

## 2016-07-30 MED ORDER — FUROSEMIDE 20 MG PO TABS: ORAL_TABLET | ORAL | 2 refills | Status: DC

## 2016-07-30 MED ORDER — ALPRAZOLAM 0.25 MG PO TABS: 0.2500 mg | ORAL_TABLET | Freq: Two times a day (BID) | ORAL | 1 refills | Status: DC | PRN

## 2016-07-30 NOTE — Progress Notes (Signed)
Patient ID: Nicole Austin, female   DOB: [birth year], 56 y.o.   MRN: [id number] [facility] MD/PA/NP OP Progress Note  07/30/2016 4:04 PM Nicole Austin  MRN:  [id number]  Subjective:  Patient is a 56 year old Caucasian female with history of bipolar disorder and posttraumatic stress disorder who was seen for follow-up. She was loud and yelling during the interview as she reported that she has just moved to a trailer home. She reported that she was having problems in her apartment due to smoke detector. She was upset about the dose of her Xanax which was recently decreased. She reported that she has several problems and she cannot tolerate the dose of Xanax to be decreased. She continued to be loud and upset. She reported that she also takes Lyrica and Seroquel and does not have any problems with being too sedated. We discussed about her medications to be adjusted.  Patient appeared to have been more symptoms at this time and is not able to control herself.  I reviewed her chart as well as her [place] controlled registry. She is also getting lyrica on a regular basis.    Chief Complaint:  Chief Complaint    Follow-up; Medication Refill     Visit Diagnosis:     ICD-9-CM ICD-10-CM   1. Bipolar I disorder, most recent episode depressed ([place]) 296.50 F31.30   2. PTSD (post-traumatic stress disorder) 309.81 F43.10     Past Medical History:  Past Medical History:  Diagnosis Date  . ADHD (attention deficit hyperactivity disorder)   . Arthritis   . Asthma   . Bipolar 1 disorder ([place])   . COPD (chronic obstructive pulmonary disease) ([place])   . Depression   . GERD (gastroesophageal reflux disease) 08/30/2015  . History of acute myocardial infarction 06/30/2015   Overview:  [facility]   . Low HDL (under 40) 08/30/2015  . MI (myocardial infarction) ([place])   . Prediabetes 05/20/2016  . PTSD (post-traumatic stress disorder)   . Rhabdomyolysis   . Stroke [facility])     Past Surgical History:  Procedure  Laterality Date  . BACK SURGERY    . REPLACEMENT TOTAL KNEE Left    Family History:  Family History  Problem Relation Age of Onset  . Hernia Mother   . Heart disease Mother   . OCD Mother   . Diabetes Mother   . Parkinson's disease Father   . Bipolar disorder Sister   . Schizophrenia Sister   . ADD / ADHD Sister   . Alcohol abuse Brother   . Bipolar disorder Sister   . Paranoid behavior Sister   . ADD / ADHD Sister   . ADD / ADHD Son   . Dementia Maternal Grandmother   . Emphysema Maternal Grandfather   . ADD / ADHD Son   . ADD / ADHD Son   . Depression Son    Social History:  Social History   Social History  . Marital status: Single    Spouse name: N/A  . Number of children: N/A  . Years of education: N/A   Social History Main Topics  . Smoking status: Current Every Day Smoker    Packs/day: 1.00    Years: 36.00    Types: Cigarettes    Start date: 10/11/1984  . Smokeless tobacco: Never Used  . Alcohol use No  . Drug use: No  . Sexual activity: No   Other Topics Concern  . None   Social History Narrative  . None  Additional History:   Assessment:   Musculoskeletal: Strength & Muscle Tone: within normal limits Gait & Station: limping Patient leans: N/A  Psychiatric Specialty Exam: Anxiety  Symptoms include insomnia and nervous/anxious behavior. Patient reports no suicidal ideas.    Depression         Associated symptoms include insomnia.  Associated symptoms include no suicidal ideas.  Past medical history includes anxiety.   Insomnia  PMH includes: depression.  Medication Refill     Review of Systems  Psychiatric/Behavioral: Positive for depression. Negative for hallucinations, memory loss, substance abuse and suicidal ideas. The patient is nervous/anxious and has insomnia.     Blood pressure 124/77, pulse 81, temperature 98.4 F (36.9 C), temperature source Oral, weight 266 lb 12.8 oz (121 kg).Body mass index is 50.41 kg/m.  General  Appearance:fair   Eye Contact:  Good  Speech:  Pressured  Volume:  Increased  Mood:  Anxious and Depressed  Affect:  Constricted   Thought Process:  Goal Directed  Orientation:  Full (Time, Place, and Person)  Thought Content:  Negative  Suicidal Thoughts:  No  Homicidal Thoughts:  No  Memory:  Immediate;   Good Recent;   Good Remote;   Good  Judgement:  Good  Insight:  Good  Psychomotor Activity:  Negative  Concentration:  Good  Recall:  Good  Fund of Knowledge: Good  Language: Good  Akathisia:  Negative  Handed:  Right unknown  AIMS (if indicated):  Not done  Assets:  Communication Skills Housing Social Support  ADL's:  Intact  Cognition: WNL  Sleep:  Good   Is the patient at risk to self?  No. Has the patient been a risk to self in the past 6 months?  No. Has the patient been a risk to self within the distant past?  Yes.   Is the patient a risk to others?  No. Has the patient been a risk to others in the past 6 months?  No. Has the patient been a risk to others within the distant past?  No.  Current Medications: Current Outpatient Prescriptions  Medication Sig Dispense Refill  . albuterol (PROVENTIL HFA;VENTOLIN HFA) 108 (90 Base) MCG/ACT inhaler Inhale 2 puffs into the lungs every 6 (six) hours as needed for wheezing or shortness of breath. 1 Inhaler 0  . albuterol (PROVENTIL) (2.5 MG/3ML) 0.083% nebulizer solution Take 3 mLs (2.5 mg total) by nebulization every 4 (four) hours as needed for wheezing or shortness of breath. 75 mL 2  . ALPRAZolam (XANAX) 0.25 MG tablet Take 1 tablet (0.25 mg total) by mouth 2 (two) times daily as needed for anxiety. 60 tablet 1  . ASPIRIN LOW DOSE 81 MG EC tablet Take 1 tablet by mouth daily.    Marland Kitchen atorvastatin (LIPITOR) 40 MG tablet TAKE 1 TABLET BY MOUTH AT BEDTIME 30 tablet 3  . divalproex (DEPAKOTE) 250 MG DR tablet Take 1 tablet (250 mg total) by mouth 2 (two) times daily. 60 tablet 2  . escitalopram (LEXAPRO) 20 MG tablet Take 1  tablet (20 mg total) by mouth daily. 30 tablet 2  . Glycopyrrolate-Formoterol (BEVESPI AEROSPHERE) 9-4.8 MCG/ACT AERO Inhale 2 puffs into the lungs 2 (two) times daily. 1 Inhaler 11  . lidocaine (XYLOCAINE) 5 % ointment Apply 1 application topically as needed for mild pain.    . Liraglutide -Weight Management (SAXENDA) 18 MG/3ML SOPN Inject 0.6 mg into the skin daily. x 1 week, then 1.2 mg daily x 1 week, then 1.8 mg daily  x 1 week, then 2.4 mg daily x 1 week, then 3 mg daily 3 mL 0  . meloxicam (MOBIC) 15 MG tablet Take 1 tablet (15 mg total) by mouth daily. 30 tablet 0  . Multiple Vitamin (MULTIVITAMIN WITH MINERALS) TABS tablet Take 1 tablet by mouth daily.    . naproxen (NAPROSYN) 375 MG tablet Take 1 tablet (375 mg total) by mouth 2 (two) times daily as needed. (take aspirin at least one full hour before first naproxen of the day) 60 tablet 1  . potassium chloride (KLOR-CON 10) 10 MEQ tablet One by mouth every three days 10 tablet 2  . pregabalin (LYRICA) 200 MG capsule Take 1 capsule (200 mg total) by mouth 2 (two) times daily. 60 capsule 2  . QUEtiapine (SEROQUEL XR) 300 MG 24 hr tablet Take 1 tablet (300 mg total) by mouth at bedtime. 30 tablet 2  . ranitidine (ZANTAC) 150 MG tablet Take 1 tablet (150 mg total) by mouth 2 (two) times daily. 60 tablet 2  . Respiratory Therapy Supplies (NEBULIZER COMPRESSOR) KIT One nebulizer kit/machine with tubing; for use with nebulized albuterol 1 each 0  . traMADol (ULTRAM) 50 MG tablet Take 1 tablet (50 mg total) by mouth every 6 (six) hours as needed. 12 tablet 0  . furosemide (LASIX) 20 MG tablet One by mouth every three days for swelling 30 tablet 2  . hydrOXYzine (ATARAX/VISTARIL) 10 MG tablet Take 1 tablet (10 mg total) by mouth 2 (two) times daily as needed. 30 tablet 1   No current facility-administered medications for this visit.     Medical Decision Making:  Established Problem, Stable/Improving (1) and Review or order clinical lab tests  (1)  Treatment Plan Summary:Medication management    Bipolar disorder-continue Seroquel XR at 300 mg at bedtime.    Continue  Xanax 0.25 mg  twice a day    Continue her Lexapro 20 mg a day.  She is on Depakote 250 mg twice a day   Patient will follow up in 2 months  or earlier depending on her symptoms    More than 50% of the time spent in psychoeducation, counseling and coordination of care.    This note was generated in part or whole with voice recognition software. Voice regonition is usually quite accurate but there are transcription errors that can and very often do occur. I apologize for any typographical errors that were not detected and corrected.    Rainey Pines, MD  07/30/2016, 4:04 PM

## 2016-08-09 ENCOUNTER — Telehealth: Payer: Self-pay | Admitting: Family Medicine

## 2016-08-09 NOTE — Telephone Encounter (Signed)
Patient would like a RX for her sciatica pain sent into [facility].

## 2016-08-13 NOTE — Telephone Encounter (Signed)
Pt has an appointment tomorrow at 3 pm.

## 2016-08-13 NOTE — Telephone Encounter (Signed)
She has Xanax and Tramadol listed in her med list, so I'm not able to send anything in She should have an appointment or go to urgent care Thank you

## 2016-08-14 ENCOUNTER — Ambulatory Visit: Payer: Self-pay | Admitting: Family Medicine

## 2016-08-25 ENCOUNTER — Other Ambulatory Visit: Payer: Self-pay | Admitting: Psychiatry

## 2016-08-25 ENCOUNTER — Other Ambulatory Visit: Payer: Self-pay | Admitting: Family Medicine

## 2016-08-26 ENCOUNTER — Encounter: Payer: Self-pay | Admitting: Family Medicine

## 2016-08-26 NOTE — Telephone Encounter (Signed)
Naproxen was refilled on 07/04/16 with 2 month supply, so too early to fill - may request around 09/03/16. PT also has no follow up scheduled - Please have patient schedule a follow up with her PCP (Dr. Sanda Klein) for as soon as possible, was supposed to follow up in April and no-showed.  Thank you!

## 2016-09-13 ENCOUNTER — Ambulatory Visit: Payer: Self-pay | Admitting: Family Medicine

## 2016-09-20 ENCOUNTER — Ambulatory Visit: Payer: Self-pay | Admitting: Family Medicine

## 2016-09-28 ENCOUNTER — Other Ambulatory Visit: Payer: Self-pay | Admitting: Family Medicine

## 2016-10-01 ENCOUNTER — Other Ambulatory Visit: Payer: Self-pay | Admitting: *Deleted

## 2016-10-01 NOTE — Patient Outreach (Signed)
[place] [facility]) Care Management  10/01/2016  [name] 04/30/1960 [id number]   Phone call from patient stating that she has recently moved from [place] to a friend of hers and is now regretting the move stating financial difficulties. Patient discussed feeling pushed to move out of  [facility] due to unsubstantiated reports. Reports were found unsubstantiated by the courts, however patient continued to feel harassed. {Patient discussed being represented by legal aid.    Per patient, the rent alone is half of her income. Residing with her sister is not an option at this time. She has thought about moving in with her children in [place], however they have not returned her call.  Patient also discussed that her psychiatrist took replaced her xanax with another medication that does not seem to work as well. Per patient, she sees her Psychiatrist this week and will discuss the medication issue at that time.  This Education officer, museum suggested applying for [employer] as well as low income housing in the [place] area. This social worker will mail patient a list of low income housing in [place] to assist with possible housing options.    [name] [facility] Care Management [phone]

## 2016-10-01 NOTE — Telephone Encounter (Signed)
Pt hs appt on 10/04/16 Rx approved

## 2016-10-02 ENCOUNTER — Encounter: Payer: Self-pay | Admitting: *Deleted

## 2016-10-02 NOTE — Progress Notes (Signed)
This encounter was created in error - please disregard.

## 2016-10-02 NOTE — Patient Outreach (Signed)
Request received from Chrystal Land, LCSW to mail patient personal care resources.  Information mailed today. 

## 2016-10-04 ENCOUNTER — Emergency Department: Payer: Medicare HMO

## 2016-10-04 ENCOUNTER — Telehealth: Payer: Self-pay | Admitting: Family Medicine

## 2016-10-04 ENCOUNTER — Encounter: Payer: Self-pay | Admitting: Emergency Medicine

## 2016-10-04 ENCOUNTER — Emergency Department
Admission: EM | Admit: 2016-10-04 | Discharge: 2016-10-04 | Disposition: A | Discharge status: Home / Self Care | Payer: Medicare HMO | Attending: Emergency Medicine | Admitting: Emergency Medicine

## 2016-10-04 ENCOUNTER — Ambulatory Visit (INDEPENDENT_AMBULATORY_CARE_PROVIDER_SITE_OTHER): Payer: Medicare HMO | Admitting: Family Medicine

## 2016-10-04 ENCOUNTER — Encounter: Payer: Self-pay | Admitting: Family Medicine

## 2016-10-04 VITALS — BP 152/94 | HR 119 | Temp 98.0°F | Resp 18 | Wt 256.1 lb

## 2016-10-04 DIAGNOSIS — F411 Generalized anxiety disorder: Secondary | ICD-10-CM | POA: Diagnosis not present

## 2016-10-04 DIAGNOSIS — F17210 Nicotine dependence, cigarettes, uncomplicated: Secondary | ICD-10-CM | POA: Diagnosis not present

## 2016-10-04 DIAGNOSIS — Z96652 Presence of left artificial knee joint: Secondary | ICD-10-CM | POA: Insufficient documentation

## 2016-10-04 DIAGNOSIS — J449 Chronic obstructive pulmonary disease, unspecified: Secondary | ICD-10-CM | POA: Insufficient documentation

## 2016-10-04 DIAGNOSIS — F319 Bipolar disorder, unspecified: Secondary | ICD-10-CM | POA: Insufficient documentation

## 2016-10-04 DIAGNOSIS — J45909 Unspecified asthma, uncomplicated: Secondary | ICD-10-CM | POA: Diagnosis not present

## 2016-10-04 DIAGNOSIS — F419 Anxiety disorder, unspecified: Secondary | ICD-10-CM | POA: Diagnosis not present

## 2016-10-04 DIAGNOSIS — R0789 Other chest pain: Secondary | ICD-10-CM | POA: Diagnosis not present

## 2016-10-04 DIAGNOSIS — I252 Old myocardial infarction: Secondary | ICD-10-CM | POA: Diagnosis not present

## 2016-10-04 DIAGNOSIS — Z1211 Encounter for screening for malignant neoplasm of colon: Secondary | ICD-10-CM

## 2016-10-04 DIAGNOSIS — Z8673 Personal history of transient ischemic attack (TIA), and cerebral infarction without residual deficits: Secondary | ICD-10-CM | POA: Diagnosis not present

## 2016-10-04 DIAGNOSIS — R0602 Shortness of breath: Secondary | ICD-10-CM | POA: Diagnosis not present

## 2016-10-04 DIAGNOSIS — R079 Chest pain, unspecified: Secondary | ICD-10-CM

## 2016-10-04 DIAGNOSIS — G894 Chronic pain syndrome: Secondary | ICD-10-CM

## 2016-10-04 DIAGNOSIS — F3132 Bipolar disorder, current episode depressed, moderate: Secondary | ICD-10-CM

## 2016-10-04 LAB — BASIC METABOLIC PANEL
Anion gap: 9 (ref 5–15)
BUN: 11 mg/dL (ref 6–20)
CHLORIDE: 104 mmol/L (ref 101–111)
CO2: 25 mmol/L (ref 22–32)
Calcium: 9.4 mg/dL (ref 8.9–10.3)
Creatinine, Ser: 1.02 mg/dL — ABNORMAL HIGH (ref 0.44–1.00)
GFR calc Af Amer: >60 mL/min (ref >60)
GFR calc non Af Amer: 60 mL/min — ABNORMAL LOW (ref >60)
GLUCOSE: 114 mg/dL — AB (ref 65–99)
POTASSIUM: 3.3 mmol/L — AB (ref 3.5–5.1)
Sodium: 138 mmol/L (ref 135–145)

## 2016-10-04 LAB — TROPONIN I: Troponin I: <0.03 ng/mL (ref <0.03)

## 2016-10-04 LAB — CBC
HCT: 41.7 % (ref 35.0–47.0)
HEMOGLOBIN: 14.3 g/dL (ref 12.0–16.0)
MCH: 31 pg (ref 26.0–34.0)
MCHC: 34.3 g/dL (ref 32.0–36.0)
MCV: 90.4 fL (ref 80.0–100.0)
Platelets: 200 10*3/uL (ref 150–440)
RBC: 4.61 MIL/uL (ref 3.80–5.20)
RDW: 14.2 % (ref 11.5–14.5)
WBC: 8.5 10*3/uL (ref 3.6–11.0)

## 2016-10-04 MED ORDER — ALPRAZOLAM 0.5 MG PO TABS
1.0000 mg | ORAL_TABLET | Freq: Once | ORAL | Status: AC
  Administered 2016-10-04: 1 mg via ORAL
  Filled 2016-10-04: qty 2

## 2016-10-04 MED ORDER — ALPRAZOLAM 0.5 MG PO TABS: 0.5000 mg | ORAL_TABLET | Freq: Three times a day (TID) | ORAL | 0 refills | Status: DC | PRN

## 2016-10-04 NOTE — ED Triage Notes (Signed)
Pt to ed via [facility] with reports of left side chest pain for one week. Pt states ran out of her anxiety meds one week ago and does not know if the two are related. Pt was given 2 nitro tabs SL and four baby asa. Pt with hx of MI.

## 2016-10-04 NOTE — ED Notes (Signed)
Pt states that HR went up during a doctors visit. States she has been under a lot of stress lately and has been having pains in the chest and left arm for the past week.

## 2016-10-04 NOTE — Assessment & Plan Note (Signed)
Refer to psych 

## 2016-10-04 NOTE — ED Provider Notes (Signed)
[facility] Emergency Department Provider Note       Time seen: ----------------------------------------- 4:38 PM on 10/04/2016 -----------------------------------------     I have reviewed the triage vital signs and the nursing notes.   HISTORY   Chief Complaint Chest Pain and Anxiety    HPI Nicole Austin is a 56 y.o. female who presents to the ED for left-sided chest pain for the last week. Patient states she ran out of her anxiety medication at least a week ago and is not sure if the 2 are related. She was given 2 nitroglycerin tablets and 4 baby aspirin. She does report history of heart attack in the past. Nothing makes her symptoms better or worse.   Past Medical History:  Diagnosis Date  . ADHD (attention deficit hyperactivity disorder)   . Arthritis   . Asthma   . Bipolar 1 disorder ([place])   . COPD (chronic obstructive pulmonary disease) ([place])   . Depression   . GERD (gastroesophageal reflux disease) 08/30/2015  . History of acute myocardial infarction 06/30/2015   Overview:  [facility]   . Low HDL (under 40) 08/30/2015  . MI (myocardial infarction) ([place])   . Prediabetes 05/20/2016  . PTSD (post-traumatic stress disorder)   . Rhabdomyolysis   . Stroke [facility])     Patient Active Problem List   Diagnosis Date Noted  . Prediabetes 05/20/2016  . Neuropathy, peripheral 05/07/2016  . Right sided sciatica 04/19/2016  . Pain in right knee 01/05/2016  . Trapezius muscle spasm 12/17/2015  . Facet hypertrophy of lumbar region 11/25/2015  . Fall at home 11/25/2015  . Abnormal CBC 11/09/2015  . Vitamin B12 deficiency 10/24/2015  . Fatigue 10/04/2015  . Encounter for medication monitoring 10/04/2015  . Hypoalbuminemia 10/04/2015  . Fibromyalgia 08/30/2015  . Low HDL (under 40) 08/30/2015  . GERD (gastroesophageal reflux disease) 08/30/2015  . Other specified behavioral and emotional disorders with onset usually occurring in childhood and  adolescence 07/13/2015  . OP (osteoporosis) 06/30/2015  . Pain in thoracic spine 06/30/2015  . Personal history of other diseases of the musculoskeletal system and connective tissue 06/30/2015  . Primary malignant neoplasm ([place]) 06/30/2015  . Apnea, sleep 06/30/2015  . Controlled substance agreement signed 06/21/2015  . Other long term (current) drug therapy 06/21/2015  . HLD (hyperlipidemia) 05/13/2015  . Foot ulcer, left ([place]) 04/27/2015  . Chronic pain disorder 04/27/2015  . Chronic pain associated with significant psychosocial dysfunction 04/27/2015  . Non-pressure ulcer of lower extremity ([place]) 04/27/2015  . Stroke ([place]) 03/25/2015  . Cerebral infarction ([place]) 03/25/2015  . Chest pain 12/31/2014  . Morbid obesity ([place]) 12/22/2014  . Bipolar affective disorder, current episode depressed ([place]) 12/22/2014  . Gout 08/16/2014  . Muscle spasms of both lower extremities 08/16/2014  . Spasm 08/16/2014  . Back pain, chronic 08/13/2014  . Low back pain with sciatica 08/04/2014  . Current tobacco use 08/04/2014  . Polysubstance abuse 07/04/2014  . Psychoactive substance abuse 07/04/2014  . Anxiety, generalized 11/24/2013  . Imbalance 11/24/2013  . Other abnormalities of gait and mobility 11/24/2013  . Moderate COPD (chronic obstructive pulmonary disease) ([place]) 11/18/2013  . HPV (human papilloma virus) infection 07/17/2013  . Arthritis of knee, degenerative 07/15/2013  . Personal history of other specified conditions 07/31/2011    Past Surgical History:  Procedure Laterality Date  . BACK SURGERY    . REPLACEMENT TOTAL KNEE Left     Allergies Chantix [varenicline]  Social History Social History  Substance Use Topics  .  Smoking status: Current Every Day Smoker    Packs/day: 1.00    Years: 36.00    Types: Cigarettes    Start date: 10/11/1984  . Smokeless tobacco: Never Used  . Alcohol use No    Review of Systems Constitutional: Negative for fever. Eyes: Negative for  vision changes ENT:  Negative for congestion, sore throat Cardiovascular:Positive for chest pain Respiratory: Negative for shortness of breath. Gastrointestinal: Negative for abdominal pain, vomiting and diarrhea. Genitourinary: Negative for dysuria. Musculoskeletal: Negative for back pain. Skin: Negative for rash. Neurological: Negative for headaches, focal weakness or numbness. Psychiatric: Positive for anxiety  All systems negative/normal/unremarkable except as stated in the HPI  ____________________________________________   PHYSICAL EXAM:  VITAL SIGNS: ED Triage Vitals  Enc Vitals Group     BP 10/04/16 1445 124/64     Pulse Rate 10/04/16 1445 92     Resp 10/04/16 1445 18     Temp 10/04/16 1445 98 F (36.7 C)     Temp Source 10/04/16 1445 Oral     SpO2 10/04/16 1445 97 %     Weight 10/04/16 1441 256 lb (116.1 kg)     Height --      Head Circumference --      Peak Flow --      Pain Score 10/04/16 1441 10     Pain Loc --      Pain Edu? --      Excl. in [place]? --     Constitutional: Alert and oriented. Well appearing and in no distress. Eyes: Conjunctivae are normal. Normal extraocular movements. ENT   Head: Normocephalic and atraumatic.   Nose: No congestion/rhinnorhea.   Mouth/Throat: Mucous membranes are moist.   Neck: No stridor. Cardiovascular: Normal rate, regular rhythm. No murmurs, rubs, or gallops. Respiratory: Normal respiratory effort without tachypnea nor retractions. Breath sounds are clear and equal bilaterally. No wheezes/rales/rhonchi. Gastrointestinal: Soft and nontender. Normal bowel sounds Musculoskeletal: Nontender with normal range of motion in extremities. No lower extremity tenderness nor edema. Neurologic:  Normal speech and language. No gross focal neurologic deficits are appreciated.  Skin:  Skin is warm, dry and intact. No rash noted. Psychiatric: Mood and affect are normal. Speech and behavior are normal.   ____________________________________________  EKG: Interpreted by me.Sinus rhythm rate 90 bpm, normal PR interval, normal QRS size, inverted T waves  ____________________________________________  ED COURSE:  Pertinent labs & imaging results that were available during my care of the patient were reviewed by me and considered in my medical decision making (see chart for details). Patient presents for chest pain and anxiety, we will assess with labs and imaging as indicated.   Procedures ____________________________________________   LABS (pertinent positives/negatives)  Labs Reviewed  BASIC METABOLIC PANEL - Abnormal; Notable for the following:       Result Value   Potassium 3.3 (*)    Glucose, Bld 114 (*)    Creatinine, Ser 1.02 (*)    GFR calc non Af Amer 60 (*)    All other components within normal limits  CBC  TROPONIN I  TROPONIN I    RADIOLOGY Images were viewed by me  Chest x-ray is normal  ____________________________________________  FINAL ASSESSMENT AND PLAN  Chest pain, anxiety  Plan: Patient's labs and imaging were dictated above. Patient had presented for Chest pain which seems to be related to anxiety. Repeat troponin was negative. She'll be discharged with a short supply of Xanax which she is supposed to be taking.   Lenise Arena  E, MD   Note: This note was generated in part or whole with voice recognition software. Voice recognition is usually quite accurate but there are transcription errors that can and very often do occur. I apologize for any typographical errors that were not detected and corrected.     Earleen Newport, MD 10/04/16 [phone]

## 2016-10-04 NOTE — Telephone Encounter (Signed)
Pt would like a referral to Dr Kennith Gain. He is Teacher, music at [employer].

## 2016-10-04 NOTE — Progress Notes (Signed)
BP (!) 152/94   Pulse (!) 119   Temp 98 F (36.7 C) (Oral)   Resp 18   Wt 256 lb 1.6 oz (116.2 kg)   LMP  (LMP Unknown)   SpO2 97%   BMI 48.39 kg/m    Subjective:    Patient ID: Nicole Austin, female    DOB: 04-13-1960, 56 y.o.   MRN: [id number]  HPI: Nicole Austin is a 56 y.o. female  Chief Complaint  Patient presents with  . Follow-up  . Medication Refill  . Chest Pain    1 week    HPI Patient is here for f/u but is complaining of chest pain; hx of MI Patient is out of lexapro; psychiatrist stopped Xanax cold [place] and started Atarax, "Oh my God" Chest pain one week ago; pain is a 9 out of 10 in severity; left arm is painful, pain under the left breast She had a heart attack in January and stroke Cardiologist is -- "I don't have one" Shortness of breath Also nauseated No hx of DVT, no swelling in the legs Chest getting tighter and tighter through the visit she says Previous dose of Xanax 1 mg and last dose, cut down and then stopped completely 1 week ago Last dose of Lexapro 20 mg was 2 months ago; that was prescribed by Dr. Gretel Acre She ran out of aspirin a few months ago She has been taking atorvastatin, did not run out of that Lots of stress and weight loss lately  Depression screen [facility] 2/9 10/04/2016 05/07/2016 04/02/2016 01/10/2016 01/05/2016  Decreased Interest 3 0 0 2 0  Down, Depressed, Hopeless 3 1 1  - 1  PHQ - 2 Score 6 1 1 2 1   Altered sleeping 3 - - 1 -  Tired, decreased energy 3 - - 1 -  Change in appetite 3 - - 2 -  Feeling bad or failure about yourself  3 - - 3 -  Trouble concentrating 3 - - - -  Moving slowly or fidgety/restless 1 - - 3 -  Suicidal thoughts 3 - - 0 -  PHQ-9 Score 25 - - 12 -  Difficult doing work/chores Extremely dIfficult - - Very difficult -  Some recent data might be hidden    Relevant past medical, surgical, family and social history reviewed Past Medical History:  Diagnosis Date  . ADHD (attention deficit  hyperactivity disorder)   . Arthritis   . Asthma   . Bipolar 1 disorder ([place])   . COPD (chronic obstructive pulmonary disease) ([place])   . Depression   . GERD (gastroesophageal reflux disease) 08/30/2015  . History of acute myocardial infarction 06/30/2015   Overview:  [facility]   . Low HDL (under 40) 08/30/2015  . MI (myocardial infarction) ([place])   . Prediabetes 05/20/2016  . PTSD (post-traumatic stress disorder)   . Rhabdomyolysis   . Stroke [facility])    Past Surgical History:  Procedure Laterality Date  . BACK SURGERY    . REPLACEMENT TOTAL KNEE Left    Family History  Problem Relation Age of Onset  . Hernia Mother   . Heart disease Mother   . OCD Mother   . Diabetes Mother   . Parkinson's disease Father   . Bipolar disorder Sister   . Schizophrenia Sister   . ADD / ADHD Sister   . Alcohol abuse Brother   . Bipolar disorder Sister   . Paranoid behavior Sister   . ADD / ADHD Sister   .  ADD / ADHD Son   . Dementia Maternal Grandmother   . Emphysema Maternal Grandfather   . ADD / ADHD Son   . ADD / ADHD Son   . Depression Son    Social History   Social History  . Marital status: Single    Spouse name: N/A  . Number of children: N/A  . Years of education: N/A   Occupational History  . Not on file.   Social History Main Topics  . Smoking status: Current Every Day Smoker    Packs/day: 1.00    Years: 36.00    Types: Cigarettes    Start date: 10/11/1984  . Smokeless tobacco: Never Used  . Alcohol use No  . Drug use: No  . Sexual activity: No   Other Topics Concern  . Not on file   Social History Narrative  . No narrative on file    Interim medical history since last visit reviewed. Allergies and medications reviewed  Review of Systems Per HPI unless specifically indicated above     Objective:    BP (!) 152/94   Pulse (!) 119   Temp 98 F (36.7 C) (Oral)   Resp 18   Wt 256 lb 1.6 oz (116.2 kg)   LMP  (LMP Unknown)   SpO2 97%   BMI 48.39 kg/m      Physical Exam  Constitutional: She appears well-developed and well-nourished.  HENT:  Mouth/Throat: Mucous membranes are normal.  Eyes: EOM are normal. No scleral icterus.  Cardiovascular: Regular rhythm.  Tachycardia present.   Pulmonary/Chest: Effort normal and breath sounds normal.  Musculoskeletal: She exhibits no edema.  Skin: She is not diaphoretic. No pallor.  Psychiatric: Her behavior is normal. Her mood appears anxious. She exhibits a depressed mood.  Tearful, anxious; good eye contact with examiner      Assessment & Plan:   Problem List Items Addressed This Visit      Other   Chest pain - Primary   Relevant Orders   EKG 12-Lead   Anxiety, generalized (Chronic)    Patient reports changes in her medicines; may have precipitated anxiety; will have work with psychiatrist; I have no plans to prescribe benzodiazepines for this patient       Other Visit Diagnoses    Screen for colon cancer       Relevant Orders   Ambulatory referral to Gastroenterology       Follow up plan: No Follow-up on file.  An after-visit summary was printed and given to the patient at [place].  Please see the patient instructions which may contain other information and recommendations beyond what is mentioned above in the assessment and plan.  Meds ordered this encounter  Medications  . acetaminophen (TYLENOL) 500 MG tablet    Sig: Take 500 mg by mouth every 6 (six) hours as needed.    Orders Placed This Encounter  Procedures  . Ambulatory referral to Gastroenterology  . EKG 12-Lead   4 baby aspirin 81 mg 1358 hours 0.4 mg NTG SL at 1359 hours, pain level 9 out of 10 when given 2 liters/minute of oxygen started at 1358 hours Five minutes after NTG, pain is now 9 out of 10, no improvement in chest pain 1st NTG did not worsen headache 2nd NTG given at 1406 hours

## 2016-10-05 ENCOUNTER — Other Ambulatory Visit: Payer: Self-pay | Admitting: Family Medicine

## 2016-10-05 ENCOUNTER — Other Ambulatory Visit: Payer: Self-pay | Admitting: Psychiatry

## 2016-10-05 DIAGNOSIS — G894 Chronic pain syndrome: Secondary | ICD-10-CM

## 2016-10-07 ENCOUNTER — Other Ambulatory Visit: Payer: Self-pay | Admitting: Psychiatry

## 2016-10-10 NOTE — Assessment & Plan Note (Signed)
Patient reports changes in her medicines; may have precipitated anxiety; will have work with psychiatrist; I have no plans to prescribe benzodiazepines for this patient

## 2016-10-16 ENCOUNTER — Telehealth: Payer: Self-pay

## 2016-10-16 ENCOUNTER — Encounter: Payer: Self-pay | Admitting: Family Medicine

## 2016-10-16 ENCOUNTER — Ambulatory Visit (INDEPENDENT_AMBULATORY_CARE_PROVIDER_SITE_OTHER): Payer: Medicare HMO | Admitting: Family Medicine

## 2016-10-16 VITALS — BP 138/80 | HR 90 | Temp 98.5°F | Resp 16 | Wt 257.2 lb

## 2016-10-16 DIAGNOSIS — E876 Hypokalemia: Secondary | ICD-10-CM | POA: Diagnosis not present

## 2016-10-16 DIAGNOSIS — Z114 Encounter for screening for human immunodeficiency virus [HIV]: Secondary | ICD-10-CM | POA: Diagnosis not present

## 2016-10-16 DIAGNOSIS — E8809 Other disorders of plasma-protein metabolism, not elsewhere classified: Secondary | ICD-10-CM

## 2016-10-16 DIAGNOSIS — F410 Panic disorder [episodic paroxysmal anxiety] without agoraphobia: Secondary | ICD-10-CM

## 2016-10-16 DIAGNOSIS — Z23 Encounter for immunization: Secondary | ICD-10-CM | POA: Diagnosis not present

## 2016-10-16 DIAGNOSIS — F430 Acute stress reaction: Secondary | ICD-10-CM | POA: Diagnosis not present

## 2016-10-16 DIAGNOSIS — Z5181 Encounter for therapeutic drug level monitoring: Secondary | ICD-10-CM | POA: Insufficient documentation

## 2016-10-16 DIAGNOSIS — J449 Chronic obstructive pulmonary disease, unspecified: Secondary | ICD-10-CM | POA: Diagnosis not present

## 2016-10-16 DIAGNOSIS — E786 Lipoprotein deficiency: Secondary | ICD-10-CM

## 2016-10-16 DIAGNOSIS — R7303 Prediabetes: Secondary | ICD-10-CM

## 2016-10-16 DIAGNOSIS — Z1159 Encounter for screening for other viral diseases: Secondary | ICD-10-CM | POA: Diagnosis not present

## 2016-10-16 MED ORDER — POTASSIUM CHLORIDE ER 10 MEQ PO TBCR: EXTENDED_RELEASE_TABLET | ORAL | 2 refills | Status: DC

## 2016-10-16 NOTE — Assessment & Plan Note (Signed)
Stable on current medicines; quit smoking, see AVS

## 2016-10-16 NOTE — Assessment & Plan Note (Signed)
Praise given for her efforts to lose weight; encouragement given; see AVS

## 2016-10-16 NOTE — Assessment & Plan Note (Signed)
Check K+, check SGPT

## 2016-10-16 NOTE — Assessment & Plan Note (Signed)
Check A1c today; weight loss key to preventing diabetes

## 2016-10-16 NOTE — Telephone Encounter (Signed)
Attempted colonoscopy triage but patient is having diarrhea, nausea, and change of eating habits.   Transferred patient to scheduler for appointment.

## 2016-10-16 NOTE — Assessment & Plan Note (Signed)
Check albumin and total protein

## 2016-10-16 NOTE — Patient Instructions (Addendum)
Please do get a colonoscopy Please do call to schedule your mammogram; the number to schedule one at either [facility] or [facility] is [phone]  I do encourage you to quit smoking Call [phone] to sign up for smoking cessation classes You can call 1-800-QUIT-NOW to talk with a smoking cessation coach  Check out the information at familydoctor.org entitled "Nutrition for Weight Loss: What You Need to Know about Fad Diets" Try to lose between 1-2 pounds per week by taking in fewer calories and burning off more calories You can succeed by limiting portions, limiting foods dense in calories and fat, becoming more active, and drinking 8 glasses of water a day (64 ounces) Don't skip meals, especially breakfast, as skipping meals may alter your metabolism Do not use over-the-counter weight loss pills or gimmicks that claim rapid weight loss A healthy BMI (or body mass index) is between 18.5 and 24.9 You can calculate your ideal BMI at the [place] website ClubMonetize.fr   Health Risks of Smoking Smoking cigarettes is very bad for your health. Tobacco smoke has over 200 known poisons in it. It contains the poisonous gases nitrogen oxide and carbon monoxide. There are over 60 chemicals in tobacco smoke that cause cancer. Smoking is difficult to quit because a chemical in tobacco, called nicotine, causes addiction or dependence. When you smoke and inhale, nicotine is absorbed rapidly into the bloodstream through your lungs. Both inhaled and non-inhaled nicotine may be addictive. What are the risks of cigarette smoke? Cigarette smokers have an increased risk of many serious medical problems, including:  Lung cancer.  Lung disease, such as pneumonia, bronchitis, and emphysema.  Chest pain (angina) and heart attack because the heart is not getting enough oxygen.  Heart disease and peripheral blood vessel  disease.  High blood pressure (hypertension).  Stroke.  Oral cancer, including cancer of the lip, mouth, or voice box.  Bladder cancer.  Pancreatic cancer.  Cervical cancer.  Pregnancy complications, including premature birth.  Stillbirths and smaller newborn babies, birth defects, and genetic damage to sperm.  Early menopause.  Lower estrogen level for women.  Infertility.  Facial wrinkles.  Blindness.  Increased risk of broken bones (fractures).  Senile dementia.  Stomach ulcers and internal bleeding.  Delayed wound healing and increased risk of complications during surgery.  Even smoking lightly shortens your life expectancy by several years.  Because of secondhand smoke exposure, children of smokers have an increased risk of the following:  Sudden infant death syndrome (SIDS).  Respiratory infections.  Lung cancer.  Heart disease.  Ear infections.  What are the benefits of quitting? There are many health benefits of quitting smoking. Here are some of them:  Within days of quitting smoking, your risk of having a heart attack decreases, your blood flow improves, and your lung capacity improves. Blood pressure, pulse rate, and breathing patterns start returning to normal soon after quitting.  Within months, your lungs may clear up completely.  Quitting for 10 years reduces your risk of developing lung cancer and heart disease to almost that of a nonsmoker.  People who quit may see an improvement in their overall quality of life.  How do I quit smoking? Smoking is an addiction with both physical and psychological effects, and longtime habits can be hard to change. Your health care provider can recommend:  Programs and community resources, which may include group support, education, or talk therapy.  Prescription medicines to help reduce cravings.  Nicotine replacement products, such  as patches, gum, and nasal sprays. Use these products only as  directed. Do not replace cigarette smoking with electronic cigarettes, which are commonly called e-cigarettes. The safety of e-cigarettes is not known, and some may contain harmful chemicals.  A combination of two or more of these methods.  Where to find more information:  American Lung Association: www.lung.org  American Cancer Society: www.cancer.org Summary  Smoking cigarettes is very bad for your health. Cigarette smokers have an increased risk of many serious medical problems, including several cancers, heart disease, and stroke.  Smoking is an addiction with both physical and psychological effects, and longtime habits can be hard to change.  By stopping right away, you can greatly reduce the risk of medical problems for you and your family.  To help you quit smoking, your health care provider can recommend programs, community resources, prescription medicines, and nicotine replacement products such as patches, gum, and nasal sprays. This information is not intended to replace advice given to you by your health care provider. Make sure you discuss any questions you have with your health care provider. Document Released: 03/22/2004 Document Revised: 02/17/2016 Document Reviewed: 02/17/2016 Elsevier Interactive Patient Education  2017 [employer].

## 2016-10-16 NOTE — Assessment & Plan Note (Signed)
Check lipids today; weight loss should help with the low HDL

## 2016-10-16 NOTE — Progress Notes (Signed)
BP 138/80   Pulse 90   Temp 98.5 F (36.9 C) (Oral)   Resp 16   Wt 257 lb 3.2 oz (116.7 kg)   LMP  (LMP Unknown)   SpO2 93%   BMI 48.60 kg/m    Subjective:    Patient ID: Nicole Austin, female    DOB: [birth year], 56 y.o.   MRN: [id number]  HPI: Nicole Austin is a 56 y.o. female  Chief Complaint  Patient presents with  . ER FOLLOW UP    Chest pain     HPI Patient is here for ER follow-up She was seen here and was sent to the ER on August 9th They told her that she had a panic attack The atarax makes her feel cloudy or like a robot and she quit taking it Doesn't feel right; did not agree with her stomach; horrible They gave her a week's supply of the Xanax in the ER; she was supposed to go back to the psychiatrist but they changed her date She will be seeing psychiatrist soon Her living situation "sucks"; she is not subsidized any more; paying $400 for rent; on disability check; has a roommate and not used to that She is losing weight after stopping the Lyrica; used to weigh 274 down to 257 pounds now She has the lasix but no potassium  Swelling is better after stopping the Lyrica too Back on seroquel; back on lexapro, "thank God" she said; felt like a new person again after starting She is going to change psychiatrist, back to Dr. Kasandra Knudsen Came fasting for labs Health maintenance She is going to get the flu shot Mammogram order is in the system; ordered on Apr 02, 2016 She has the card to call She is going to call to set up her colonoscopy I reviewed her ER records, labs, study reports Chart review; prediabetes, last A1c was 5.7 in January; last K+ was 3.3  Depression screen [facility] 2/9 10/16/2016 10/04/2016 05/07/2016 04/02/2016 01/10/2016  Decreased Interest 2 3 0 0 2  Down, Depressed, Hopeless 2 3 1 1  -  PHQ - 2 Score 4 6 1 1 2   Altered sleeping 3 3 - - 1  Tired, decreased energy 3 3 - - 1  Change in appetite 1 3 - - 2  Feeling bad or failure about yourself  2 3  - - 3  Trouble concentrating 2 3 - - -  Moving slowly or fidgety/restless 2 1 - - 3  Suicidal thoughts 0 3 - - 0  PHQ-9 Score 17 25 - - 12  Difficult doing work/chores Not difficult at all Extremely dIfficult - - Very difficult  Some recent data might be hidden    Relevant past medical, surgical, family and social history reviewed Past Medical History:  Diagnosis Date  . ADHD (attention deficit hyperactivity disorder)   . Arthritis   . Asthma   . Bipolar 1 disorder ([place])   . COPD (chronic obstructive pulmonary disease) ([place])   . Depression   . GERD (gastroesophageal reflux disease) 08/30/2015  . History of acute myocardial infarction 06/30/2015   Overview:  [facility]   . Low HDL (under 40) 08/30/2015  . MI (myocardial infarction) ([place])   . Prediabetes 05/20/2016  . PTSD (post-traumatic stress disorder)   . Rhabdomyolysis   . Stroke [facility])    Past Surgical History:  Procedure Laterality Date  . BACK SURGERY    . REPLACEMENT TOTAL KNEE Left    Family History  Problem Relation Age of Onset  . Hernia Mother   . Heart disease Mother   . OCD Mother   . Diabetes Mother   . Parkinson's disease Father   . Bipolar disorder Sister   . Schizophrenia Sister   . ADD / ADHD Sister   . Alcohol abuse Brother   . Bipolar disorder Sister   . Paranoid behavior Sister   . ADD / ADHD Sister   . ADD / ADHD Son   . Dementia Maternal Grandmother   . Emphysema Maternal Grandfather   . ADD / ADHD Son   . ADD / ADHD Son   . Depression Son    Social History   Social History  . Marital status: Single    Spouse name: N/A  . Number of children: N/A  . Years of education: N/A   Occupational History  . Not on file.   Social History Main Topics  . Smoking status: Current Every Day Smoker    Packs/day: 1.00    Years: 36.00    Types: Cigarettes    Start date: 10/11/1984  . Smokeless tobacco: Never Used  . Alcohol use No  . Drug use: No  . Sexual activity: No   Other Topics Concern  . Not on  file   Social History Narrative  . No narrative on file    Interim medical history since last visit reviewed. Allergies and medications reviewed  Review of Systems Per HPI unless specifically indicated above     Objective:    BP 138/80   Pulse 90   Temp 98.5 F (36.9 C) (Oral)   Resp 16   Wt 257 lb 3.2 oz (116.7 kg)   LMP  (LMP Unknown)   SpO2 93%   BMI 48.60 kg/m   Wt Readings from Last 3 Encounters:  10/16/16 257 lb 3.2 oz (116.7 kg)  10/04/16 256 lb (116.1 kg)  10/04/16 256 lb 1.6 oz (116.2 kg)    Physical Exam  Constitutional: She appears well-developed and well-nourished. No distress.  HENT:  Head: Normocephalic and atraumatic.  Eyes: EOM are normal. No scleral icterus.  Neck: No thyromegaly present.  Cardiovascular: Normal rate, regular rhythm and normal heart sounds.   No murmur heard. Pulmonary/Chest: Effort normal and breath sounds normal. No respiratory distress. She has no wheezes.  Abdominal: Soft. Bowel sounds are normal. She exhibits no distension.  Musculoskeletal: Normal range of motion. She exhibits no edema.  Neurological: She is alert. She exhibits normal muscle tone.  Skin: Skin is warm and dry. She is not diaphoretic. No pallor.  Psychiatric: She has a normal mood and affect. Her behavior is normal. Judgment and thought content normal.      Assessment & Plan:   Problem List Items Addressed This Visit      Respiratory   Moderate COPD (chronic obstructive pulmonary disease) ([place])    Stable on current medicines; quit smoking, see AVS      Relevant Medications   Tiotropium Bromide Monohydrate (SPIRIVA RESPIMAT) 2.5 MCG/ACT AERS     Other   Prediabetes    Check A1c today; weight loss key to preventing diabetes      Relevant Orders   Hemoglobin A1c (Completed)   Morbid obesity ([place])    Praise given for her efforts to lose weight; encouragement given; see AVS      Medication monitoring encounter    Check K+, check SGPT       Relevant Orders   COMPLETE METABOLIC PANEL WITH  GFR (Completed)   Low HDL (under 40)    Check lipids today; weight loss should help with the low HDL      Relevant Orders   Lipid panel (Completed)   Hypoalbuminemia    Check albumin and total protein      Relevant Orders   COMPLETE METABOLIC PANEL WITH GFR (Completed)    Other Visit Diagnoses    Panic attack as reaction to stress    -  Primary   on August 9th; she will keep f/u with psychiatrist; I am not going to prescribe this patient benzos   Needs flu shot       Relevant Orders   Flu Vaccine QUAD 6+ mos PF IM (Fluarix Quad PF) (Completed)   Encounter for screening for HIV       Relevant Orders   HIV antibody (Completed)   Need for hepatitis C screening test       Relevant Orders   Hepatitis C antibody (Completed)   Hypokalemia       Relevant Orders   Magnesium (Completed)       Follow up plan: Return in about 3 months (around 01/16/2017) for twenty minute follow-up with fasting labs.  An after-visit summary was printed and given to the patient at [place].  Please see the patient instructions which may contain other information and recommendations beyond what is mentioned above in the assessment and plan.  Meds ordered this encounter  Medications  . aspirin-acetaminophen-caffeine (EXCEDRIN MIGRAINE) 250-250-65 MG tablet    Sig: Take by mouth every 6 (six) hours as needed for headache.  . Tiotropium Bromide Monohydrate (SPIRIVA RESPIMAT) 2.5 MCG/ACT AERS    Sig: Inhale 2 puffs into the lungs daily.  . potassium chloride (KLOR-CON 10) 10 MEQ tablet    Sig: One by mouth every three days    Dispense:  10 tablet    Refill:  2    Orders Placed This Encounter  Procedures  . Flu Vaccine QUAD 6+ mos PF IM (Fluarix Quad PF)  . Lipid panel  . Hemoglobin A1c  . COMPLETE METABOLIC PANEL WITH GFR  . Magnesium  . HIV antibody  . Hepatitis C antibody

## 2016-10-17 LAB — COMPLETE METABOLIC PANEL WITH GFR
ALBUMIN: 4 g/dL (ref 3.6–5.1)
ALK PHOS: 108 U/L (ref 33–130)
ALT: 15 U/L (ref 6–29)
AST: 16 U/L (ref 10–35)
BILIRUBIN TOTAL: 0.6 mg/dL (ref 0.2–1.2)
BUN: 9 mg/dL (ref 7–25)
CO2: 27 mmol/L (ref 20–32)
Calcium: 10 mg/dL (ref 8.6–10.4)
Chloride: 101 mmol/L (ref 98–110)
Creat: 0.96 mg/dL (ref 0.50–1.05)
GFR, EST AFRICAN AMERICAN: 76 mL/min (ref >=60)
GFR, EST NON AFRICAN AMERICAN: 66 mL/min (ref >=60)
Glucose, Bld: 90 mg/dL (ref 65–99)
POTASSIUM: 4.4 mmol/L (ref 3.5–5.3)
SODIUM: 141 mmol/L (ref 135–146)
TOTAL PROTEIN: 6.6 g/dL (ref 6.1–8.1)

## 2016-10-17 LAB — MAGNESIUM: MAGNESIUM: 1.8 mg/dL (ref 1.5–2.5)

## 2016-10-17 LAB — HEPATITIS C ANTIBODY: HCV AB: NONREACTIVE

## 2016-10-17 LAB — LIPID PANEL
CHOL/HDL RATIO: 3.7 ratio (ref <5.0)
CHOLESTEROL: 130 mg/dL (ref <200)
HDL: 35 mg/dL — ABNORMAL LOW (ref >50)
LDL CALC: 54 mg/dL (ref <100)
TRIGLYCERIDES: 203 mg/dL — AB (ref <150)
VLDL: 41 mg/dL — AB (ref <30)

## 2016-10-17 LAB — HEMOGLOBIN A1C
Hgb A1c MFr Bld: 5.5 % (ref <5.7)
Mean Plasma Glucose: 111 mg/dL

## 2016-10-17 LAB — HIV ANTIBODY (ROUTINE TESTING W REFLEX): HIV: NONREACTIVE

## 2016-10-18 ENCOUNTER — Other Ambulatory Visit: Payer: Self-pay

## 2016-10-19 ENCOUNTER — Other Ambulatory Visit: Payer: Self-pay

## 2016-10-19 DIAGNOSIS — Z5181 Encounter for therapeutic drug level monitoring: Secondary | ICD-10-CM

## 2016-10-22 ENCOUNTER — Telehealth: Payer: Self-pay

## 2016-10-22 NOTE — Telephone Encounter (Signed)
I got a call from [place] with [facility] stating that they have tried to reach this patient on multiple occasions and have been unsuccessful. They also stated that she has failed to return their calls.   They assured me that the referral is still valid if she chooses to follow through with this referral.

## 2016-10-23 ENCOUNTER — Ambulatory Visit: Payer: Medicaid Other | Admitting: Psychiatry

## 2016-10-24 NOTE — Telephone Encounter (Signed)
pt called states that her insurance changed so she had to change providers to a dr.sus. but she couldn't get an appt until  Dec 09, 2016. pt needs enough medication until then.  xanax, lexapro, seroquel. Pt seen 07-2016

## 2016-10-26 ENCOUNTER — Other Ambulatory Visit: Payer: Self-pay | Admitting: Psychiatry

## 2016-10-26 ENCOUNTER — Other Ambulatory Visit: Payer: Self-pay | Admitting: Family Medicine

## 2016-10-30 NOTE — Telephone Encounter (Signed)
pt called states she needs refill on her xanax, lexapro and seroquel her insurance changed and therefore she has to see someone else because none of the doctors are in network but her appt is not until the middle of october. pt needs enought meds to due until then.

## 2016-10-31 ENCOUNTER — Other Ambulatory Visit: Payer: Self-pay | Admitting: Licensed Clinical Social Worker

## 2016-10-31 NOTE — Patient Outreach (Signed)
[facility]) Care Management  10/31/2016  Nicole Austin 08-26-1960 [id number]   Assessment- CSW completed outreach call to patient on 10/31/16 and patient answered successfully. HIPPA verifications were provided. CSW introduced self and reason for call (to follow up on housing resources that [employer]). Patient confirms that she did receive those housing resources but has not had the chance to explore them because she has not been feeling well recently. CSW reviewed housing resources with patient and answered all of patient's questions. Patient reports that she will start to pursue these resources once she starts to feel better and have the time. CSW encouraged her to contact [place] if she had any further concerns or questions. Patient was very appreciative of call and community resource review.  Plan-CSW will close case at this time and will update [place] Management Assistant.  [name], BSW, MSW, [email or web address] Phone: [phone] Fax: [phone]

## 2016-11-01 ENCOUNTER — Encounter (HOSPITAL_COMMUNITY): Payer: Self-pay | Admitting: Psychiatry

## 2016-11-01 NOTE — Telephone Encounter (Signed)
Will defer this to DR. Faheem.

## 2016-11-01 NOTE — Telephone Encounter (Signed)
It appears that Dr. Gretel Acre re-prescribed quetiapine. Will not prescribe furosemide and will defer it to her PCP

## 2016-11-01 NOTE — Telephone Encounter (Signed)
Please advise 

## 2016-11-05 ENCOUNTER — Other Ambulatory Visit: Payer: Self-pay | Admitting: Psychiatry

## 2016-11-06 ENCOUNTER — Emergency Department
Admission: EM | Admit: 2016-11-06 | Discharge: 2016-11-06 | Disposition: A | Discharge status: Home / Self Care | Payer: Medicare Other | Attending: Emergency Medicine | Admitting: Emergency Medicine

## 2016-11-06 ENCOUNTER — Other Ambulatory Visit: Payer: Self-pay

## 2016-11-06 ENCOUNTER — Emergency Department: Payer: Medicare Other

## 2016-11-06 ENCOUNTER — Encounter: Payer: Self-pay | Admitting: *Deleted

## 2016-11-06 DIAGNOSIS — G8929 Other chronic pain: Secondary | ICD-10-CM | POA: Diagnosis not present

## 2016-11-06 DIAGNOSIS — J45909 Unspecified asthma, uncomplicated: Secondary | ICD-10-CM | POA: Insufficient documentation

## 2016-11-06 DIAGNOSIS — F17210 Nicotine dependence, cigarettes, uncomplicated: Secondary | ICD-10-CM | POA: Insufficient documentation

## 2016-11-06 DIAGNOSIS — F419 Anxiety disorder, unspecified: Secondary | ICD-10-CM | POA: Insufficient documentation

## 2016-11-06 DIAGNOSIS — J449 Chronic obstructive pulmonary disease, unspecified: Secondary | ICD-10-CM | POA: Insufficient documentation

## 2016-11-06 DIAGNOSIS — I10 Essential (primary) hypertension: Secondary | ICD-10-CM | POA: Diagnosis not present

## 2016-11-06 DIAGNOSIS — R002 Palpitations: Secondary | ICD-10-CM | POA: Insufficient documentation

## 2016-11-06 DIAGNOSIS — E119 Type 2 diabetes mellitus without complications: Secondary | ICD-10-CM | POA: Insufficient documentation

## 2016-11-06 DIAGNOSIS — R079 Chest pain, unspecified: Secondary | ICD-10-CM | POA: Diagnosis not present

## 2016-11-06 DIAGNOSIS — Z8673 Personal history of transient ischemic attack (TIA), and cerebral infarction without residual deficits: Secondary | ICD-10-CM | POA: Insufficient documentation

## 2016-11-06 DIAGNOSIS — I2510 Atherosclerotic heart disease of native coronary artery without angina pectoris: Secondary | ICD-10-CM | POA: Insufficient documentation

## 2016-11-06 DIAGNOSIS — R0602 Shortness of breath: Secondary | ICD-10-CM | POA: Diagnosis not present

## 2016-11-06 LAB — CBC
HEMATOCRIT: 41.1 % (ref 35.0–47.0)
HEMOGLOBIN: 14.3 g/dL (ref 12.0–16.0)
MCH: 31.8 pg (ref 26.0–34.0)
MCHC: 34.8 g/dL (ref 32.0–36.0)
MCV: 91.2 fL (ref 80.0–100.0)
Platelets: 212 10*3/uL (ref 150–440)
RBC: 4.51 MIL/uL (ref 3.80–5.20)
RDW: 13.8 % (ref 11.5–14.5)
WBC: 9.9 10*3/uL (ref 3.6–11.0)

## 2016-11-06 LAB — BASIC METABOLIC PANEL
Anion gap: 10 (ref 5–15)
BUN: 11 mg/dL (ref 6–20)
CALCIUM: 9.5 mg/dL (ref 8.9–10.3)
CO2: 28 mmol/L (ref 22–32)
CREATININE: 1.05 mg/dL — AB (ref 0.44–1.00)
Chloride: 98 mmol/L — ABNORMAL LOW (ref 101–111)
GFR calc Af Amer: >60 mL/min (ref >60)
GFR, EST NON AFRICAN AMERICAN: 58 mL/min — AB (ref >60)
GLUCOSE: 106 mg/dL — AB (ref 65–99)
Potassium: 4.2 mmol/L (ref 3.5–5.1)
Sodium: 136 mmol/L (ref 135–145)

## 2016-11-06 LAB — TROPONIN I: Troponin I: <0.03 ng/mL (ref <0.03)

## 2016-11-06 LAB — BRAIN NATRIURETIC PEPTIDE: B Natriuretic Peptide: 38 pg/mL (ref 0.0–100.0)

## 2016-11-06 MED ORDER — LORAZEPAM 1 MG PO TABS: 1.0000 mg | ORAL_TABLET | Freq: Four times a day (QID) | ORAL | 0 refills | Status: DC | PRN

## 2016-11-06 MED ORDER — LORAZEPAM 1 MG PO TABS
1.0000 mg | ORAL_TABLET | Freq: Once | ORAL | Status: AC
Start: 2016-11-06 — End: 2016-11-06
  Administered 2016-11-06: 1 mg via ORAL
  Filled 2016-11-06: qty 1

## 2016-11-06 MED ORDER — IPRATROPIUM-ALBUTEROL 0.5-2.5 (3) MG/3ML IN SOLN
3.0000 mL | Freq: Once | RESPIRATORY_TRACT | Status: AC
  Administered 2016-11-06: 3 mL via RESPIRATORY_TRACT
  Filled 2016-11-06: qty 3

## 2016-11-06 MED ORDER — ASPIRIN 81 MG PO CHEW
324.0000 mg | CHEWABLE_TABLET | Freq: Once | ORAL | Status: AC
  Administered 2016-11-06: 324 mg via ORAL
  Filled 2016-11-06: qty 4

## 2016-11-06 NOTE — ED Provider Notes (Addendum)
[facility] Emergency Department Provider Note  ____________________________________________  Time seen: Approximately 8:52 PM  I have reviewed the triage vital signs and the nursing notes.   HISTORY  Chief Complaint Chest Pain    HPI Alece Koppel is a 56 y.o. female with a history of CAD status post MI, CVA, COPD with ongoing tobacco abuse, HTN, DM, fibromyalgia, anxiety, depression, PTSD, presenting with chest pain. The patient reports that she has had significant stress around her roommate and issues with that person. She has not taken any of her anxiety medications, and in the setting of a disagreement with her roommate, she developed a left-sided chest "tightness" and sharp pain under the left breast with some associated pain in the bicep in the left neck. She also had a "fluttering" sensation. These symptoms are typical of her anxiety in the past. She had no associated shortness of breath, diaphoresis, nausea or vomiting. The symptoms came and went for the past 2 days, but she is chest pain-free at this time. The patient had a similar episode approximately one month ago for which she was seen in the emergency department, but did not follow-up with a cardiologist or her psychiatrist. She has not had any risk stratification study in the last year. She takes a low-dose aspirin daily.  SH: Ongoing tobacco abuse.    Past Medical History:  Diagnosis Date  . ADHD (attention deficit hyperactivity disorder)   . Arthritis   . Asthma   . Bipolar 1 disorder ([place])   . COPD (chronic obstructive pulmonary disease) ([place])   . Depression   . GERD (gastroesophageal reflux disease) 08/30/2015  . History of acute myocardial infarction 06/30/2015   Overview:  [facility]   . Low HDL (under 40) 08/30/2015  . MI (myocardial infarction) ([place])   . Prediabetes 05/20/2016  . PTSD (post-traumatic stress disorder)   . Rhabdomyolysis   . Stroke [facility])     Patient Active Problem List    Diagnosis Date Noted  . Medication monitoring encounter 10/16/2016  . Prediabetes 05/20/2016  . Neuropathy, peripheral 05/07/2016  . Right sided sciatica 04/19/2016  . Pain in right knee 01/05/2016  . Trapezius muscle spasm 12/17/2015  . Facet hypertrophy of lumbar region 11/25/2015  . Fall at home 11/25/2015  . Abnormal CBC 11/09/2015  . Vitamin B12 deficiency 10/24/2015  . Fatigue 10/04/2015  . Encounter for medication monitoring 10/04/2015  . Hypoalbuminemia 10/04/2015  . Fibromyalgia 08/30/2015  . Low HDL (under 40) 08/30/2015  . GERD (gastroesophageal reflux disease) 08/30/2015  . Other specified behavioral and emotional disorders with onset usually occurring in childhood and adolescence 07/13/2015  . OP (osteoporosis) 06/30/2015  . Pain in thoracic spine 06/30/2015  . Personal history of other diseases of the musculoskeletal system and connective tissue 06/30/2015  . Primary malignant neoplasm ([place]) 06/30/2015  . Apnea, sleep 06/30/2015  . Controlled substance agreement signed 06/21/2015  . Other long term (current) drug therapy 06/21/2015  . HLD (hyperlipidemia) 05/13/2015  . Foot ulcer, left ([place]) 04/27/2015  . Chronic pain disorder 04/27/2015  . Chronic pain associated with significant psychosocial dysfunction 04/27/2015  . Non-pressure ulcer of lower extremity ([place]) 04/27/2015  . Stroke ([place]) 03/25/2015  . Cerebral infarction ([place]) 03/25/2015  . Chest pain 12/31/2014  . Morbid obesity ([place]) 12/22/2014  . Bipolar affective disorder, current episode depressed ([place]) 12/22/2014  . Gout 08/16/2014  . Muscle spasms of both lower extremities 08/16/2014  . Spasm 08/16/2014  . Back pain, chronic 08/13/2014  .  Low back pain with sciatica 08/04/2014  . Current tobacco use 08/04/2014  . Polysubstance abuse 07/04/2014  . Psychoactive substance abuse 07/04/2014  . Anxiety, generalized 11/24/2013  . Imbalance 11/24/2013  . Other abnormalities of gait and mobility 11/24/2013   . Moderate COPD (chronic obstructive pulmonary disease) ([place]) 11/18/2013  . HPV (human papilloma virus) infection 07/17/2013  . Arthritis of knee, degenerative 07/15/2013  . Personal history of other specified conditions 07/31/2011    Past Surgical History:  Procedure Laterality Date  . BACK SURGERY    . REPLACEMENT TOTAL KNEE Left     Current Outpatient Rx  . Order #: [id number] Class: Historical Med  . Order #: [id number] Class: Print  . Order #: [id number] Class: Print  . Order #: [id number] Class: Print  . Order #: [id number] Class: Historical Med  . Order #: [id number] Class: Historical Med  . Order #: [id number] Class: Normal  . Order #: [id number] Class: Normal  . Order #: [id number] Class: Normal  . Order #: [id number] Class: Normal  . Order #: [id number] Class: Historical Med  . Order #: [id number] Class: Historical Med  . Order #: [id number] Class: Normal  . Order #: [id number] Class: Normal  . Order #: [id number] Class: Normal  . Order #: [id number] Class: Normal  . Order #: [id number] Class: Normal  . Order #: [id number] Class: Print    Allergies Chantix [varenicline]  Family History  Problem Relation Age of Onset  . Hernia Mother   . Heart disease Mother   . OCD Mother   . Diabetes Mother   . Parkinson's disease Father   . Bipolar disorder Sister   . Schizophrenia Sister   . ADD / ADHD Sister   . Alcohol abuse Brother   . Bipolar disorder Sister   . Paranoid behavior Sister   . ADD / ADHD Sister   . ADD / ADHD Son   . Dementia Maternal Grandmother   . Emphysema Maternal Grandfather   . ADD / ADHD Son   . ADD / ADHD Son   . Depression Son     Social History Social History  Substance Use Topics  . Smoking status: Current Every Day Smoker    Packs/day: 1.00    Years: 36.00    Types: Cigarettes    Start date: 10/11/1984  . Smokeless tobacco: Never Used  . Alcohol use No    Review of Systems Constitutional: No fever/chills.No lightheadedness or syncope. No diaphoresis.  Eyes: No visual changes. ENT: No sore throat. No congestion or rhinorrhea. Cardiovascular: Positive chest pain. Positive palpitations. Respiratory: Denies shortness of breath.  No cough. Gastrointestinal: No abdominal pain.  No nausea, no vomiting.  No diarrhea.  No constipation. Genitourinary: Negative for dysuria. Musculoskeletal: Negative for back pain. Skin: Negative for rash. Neurological: Negative for headaches. No focal numbness, tingling or weakness.  Psychiatric:Positive anxiety.  ____________________________________________   PHYSICAL EXAM:  VITAL SIGNS: ED Triage Vitals  Enc Vitals Group     BP 11/06/16 2013 125/80     Pulse Rate 11/06/16 2013 87     Resp 11/06/16 2013 20     Temp 11/06/16 2013 98.4 F (36.9 C)     Temp Source 11/06/16 2013 Oral     SpO2 11/06/16 2013 96 %     Weight 11/06/16 1825 257 lb (116.6 kg)     Height 11/06/16 1825 5\' 1"  (1.549 m)     Head Circumference --      Peak Flow --      Pain Score 11/06/16 1824 9     Pain Loc --  Pain Edu? --      Excl. in [place]? --     Constitutional: Alert and orientedAnd anxious. Well appearing and in no acute distress. Answers questions appropriately. Eyes: Conjunctivae are normal.  EOMI. No scleral icterus. Head: Atraumatic. Nose: No congestion/rhinnorhea. Mouth/Throat: Mucous membranes are moist.  Neck: No stridor.  Supple.  No JVD. Cardiovascular: Normal rate, regular rhythm. No murmurs, rubs or gallops.  Respiratory: Normal respiratory effort.  No accessory muscle use or retractions. Lungs CTAB.  No wheezes, rales or ronchi. Gastrointestinal: Obese. Soft, nontender and nondistended.  No guarding or rebound.  No peritoneal signs. Musculoskeletal: No LE edema. No ttp in the calves or palpable cords.  Negative Homan's sign. Neurologic:  A&Ox3.  Speech is clear.  Face and smile are symmetric.  EOMI.  Moves all extremities well. Skin:  Skin is warm, dry and intact. No rash noted. Psychiatric: Mood and  affect are normal. Speech and behavior are normal.  Normal judgement.  ____________________________________________   LABS (all labs ordered are listed, but only abnormal results are displayed)  Labs Reviewed  BASIC METABOLIC PANEL - Abnormal; Notable for the following:       Result Value   Chloride 98 (*)    Glucose, Bld 106 (*)    Creatinine, Ser 1.05 (*)    GFR calc non Af Amer 58 (*)    All other components within normal limits  CBC  TROPONIN I  BRAIN NATRIURETIC PEPTIDE  TROPONIN I   ____________________________________________  EKG  ED ECG REPORT I, Eula Listen, the attending physician, personally viewed and interpreted this ECG.   Date: 11/06/2016  EKG Time: 1821  Rate: 93  Rhythm: normal sinus rhythm  Axis: leftward  Intervals:none  ST&T Change: Nonspecific Q-wave inversions in V1 through V5. No STEMI.  EKG is grossly unchanged from 10/05/16, 1/18, including the T-wave inversions. ____________________________________________  RADIOLOGY  Dg Chest 2 View  Result Date: 11/06/2016 CLINICAL DATA:  Left-sided chest pain and shortness of Breath EXAM: CHEST  2 VIEW COMPARISON:  10/04/2016 FINDINGS: The heart size and mediastinal contours are within normal limits. Both lungs are clear. The visualized skeletal structures are unremarkable. IMPRESSION: No active cardiopulmonary disease. Electronically Signed   By: Inez Catalina M.D.   On: 11/06/2016 19:10    ____________________________________________   PROCEDURES  Procedure(s) performed: None  Procedures  Critical Care performed: No ____________________________________________   INITIAL IMPRESSION / ASSESSMENT AND PLAN / ED COURSE  Pertinent labs & imaging results that were available during my care of the patient were reviewed by me and considered in my medical decision making (see chart for details).  56 y.o. female with multiple medical and psychiatric comorbidities presenting with chest pain in  the setting of a disagreement with her roommate. Overall, the patient is hemodynamically stable. Her EKG does not show any ischemic changes and has no recent changes compared to baseline. She has a troponin which is negative, and we will repeat this for a second troponin. At this time, she is chest pain-free. It is possible that the patient has her symptoms from anxiety surrounding her roommate, she has had anemia plan to stay with her sister. ACS or MI is also on the differential, but less likely with her reassuring workup. I have spoken with Dr. Saunders Revel, who was see the patient has an outpatient for risk stratification study.  ____________________________________________  FINAL CLINICAL IMPRESSION(S) / ED DIAGNOSES  Final diagnoses:  Chest pain, unspecified type  Anxiety  Palpitations  NEW MEDICATIONS STARTED DURING THIS VISIT:  New Prescriptions   No medications on file      Eula Listen, MD 11/06/16 2107    Eula Listen, MD 11/06/16 2236

## 2016-11-06 NOTE — ED Triage Notes (Signed)
PT to ED reporting left sided chest pain radiating to left arm x 1 week. Pt reports dizziness, lightheadedness and nausea. Pt reports having increase SOB over the past week. Pt has Hx of COPD and panic attacks and reports she is not sure if this is anxiety  But reports increased stress latly.   Pt also reports having a headache at this time. No neuro deficits noted.

## 2016-11-06 NOTE — Discharge Instructions (Signed)
Please contact your psychiatrist to discuss your anxiety medications. Try to avoid confrontations which produce anxiety for you.  Please make an appointment with Dr. Saunders Revel for an outpatient stress test  Return to the emergency department if you develop severe pain, lightheadedness, palpitations, nausea or vomiting, sweaty feeling, thoughts of hurting herself or anyone else, or any other symptoms concerning to you.

## 2016-11-08 ENCOUNTER — Telehealth: Payer: Self-pay | Admitting: Family Medicine

## 2016-11-08 NOTE — Telephone Encounter (Signed)
This encounter was created in error - please disregard.

## 2016-11-08 NOTE — Telephone Encounter (Signed)
Pt is requesting refill on lexapro. States that her psychologist is out of the office until Monday. She has been completely out since day before yesterday. She has been trying to call the psychologist office but she is not getting a answer, states that her doctor is only in the office on Mondays. States that she can not be without this medication. Please advise [phone]

## 2016-11-08 NOTE — Telephone Encounter (Signed)
Routing to Dr. Manuella Ghazi to see if he will possibly fill?

## 2016-11-08 NOTE — Telephone Encounter (Signed)
Called patient and left a voice message. Okay to refill Lexapro for 5 days until patient is able to see her psychiatrist.

## 2016-11-08 NOTE — Telephone Encounter (Signed)
Left voicemail with patient and phoned in rx to [facility].

## 2016-11-12 ENCOUNTER — Ambulatory Visit: Payer: Commercial Managed Care - HMO | Admitting: Psychiatry

## 2016-11-12 ENCOUNTER — Telehealth: Payer: Self-pay

## 2016-11-12 NOTE — Telephone Encounter (Signed)
pt called very upset pt states she was suppose to have had an appt with dr. Gretel Acre today to get refills on her medication. pt states that the hospital only gave her enough for today. pt was made a new appt for next monday.   Order Providers   Prescribing Provider Encounter Provider  Earleen Newport, MD None  Medication Detail    Disp Refills Start End   ALPRAZolam Duanne Moron) 0.5 MG tablet 20 tablet 0 10/04/2016 10/04/2017   Sig - Route: Take 1 tablet (0.5 mg total) by mouth 3 (three) times daily as needed for sleep or anxiety. - Oral   Patient not taking: Reported on 10/16/2016       Class: Print    divalproex (DEPAKOTE) 250 MG DR tablet  Medication  Date: 07/30/2016 Department: [facility] Ordering/Authorizing: Rainey Pines, MD  Order Providers   Prescribing Provider Encounter Provider  Rainey Pines, MD Rainey Pines, MD  Medication Detail    Disp Refills Start End   divalproex (DEPAKOTE) 250 MG DR tablet 60 tablet 2 07/30/2016    Sig - Route: Take 1 tablet (250 mg total) by mouth 2 (two) times daily. - Oral   Sent to pharmacy as: divalproex (DEPAKOTE) 250 MG DR tablet   E-Prescribing Status: Receipt confirmed by pharmacy (07/30/2016 3:56 PM EDT)    escitalopram (LEXAPRO) 20 MG tablet  Medication  Date: 04/11/2016 Department: [facility] Ordering/Authorizing: Rainey Pines, MD  Order Providers   Prescribing Provider Encounter Provider  Rainey Pines, MD Rainey Pines, MD  Medication Detail    Disp Refills Start End   escitalopram (LEXAPRO) 20 MG tablet 30 tablet 2 04/11/2016    Sig - Route: Take 1 tablet (20 mg total) by mouth daily. - Oral   Sent to pharmacy as: escitalopram (LEXAPRO) 20 MG tablet   E-Prescribing Status: Receipt confirmed by pharmacy (04/11/2016 1:16 PM EST)    LORazepam (ATIVAN) 1 MG tablet  Medication  Date: 11/06/2016 Department: [facility] EMERGENCY DEPARTMENT Ordering/Authorizing:  Eula Listen, MD  Order Providers   Prescribing Provider Encounter Provider  Eula Listen, MD None  Medication Detail    Disp Refills Start End   LORazepam (ATIVAN) 1 MG tablet 10 tablet 0 11/06/2016 11/06/2017   Sig - Route: Take 1 tablet (1 mg total) by mouth every 6 (six) hours as needed for anxiety. - Oral   Class: Print    QUEtiapine (SEROQUEL XR) 300 MG 24 hr tablet  Medication  Date: 07/04/2016 Department: [facility] Ordering/Authorizing: Rainey Pines, MD  Order Providers   Prescribing Provider Encounter Provider  Rainey Pines, MD Rainey Pines, MD  Medication Detail    Disp Refills Start End   QUEtiapine (SEROQUEL XR) 300 MG 24 hr tablet 30 tablet 2 07/04/2016    Sig - Route: Take 1 tablet (300 mg total) by mouth at bedtime. - Oral   Sent to pharmacy as: QUEtiapine (SEROQUEL XR) 300 MG 24 hr tablet   E-Prescribing Status: Receipt confirmed by pharmacy (07/04/2016 2:32 PM EDT)

## 2016-11-12 NOTE — Telephone Encounter (Signed)
Will defer to Dr. Faheem

## 2016-11-19 ENCOUNTER — Other Ambulatory Visit: Payer: Self-pay | Admitting: Family Medicine

## 2016-11-19 ENCOUNTER — Encounter: Payer: Self-pay | Admitting: Psychiatry

## 2016-11-19 ENCOUNTER — Ambulatory Visit (INDEPENDENT_AMBULATORY_CARE_PROVIDER_SITE_OTHER): Payer: 59 | Admitting: Psychiatry

## 2016-11-19 VITALS — BP 120/78 | HR 112 | Temp 98.7°F | Wt 259.0 lb

## 2016-11-19 DIAGNOSIS — F1394 Sedative, hypnotic or anxiolytic use, unspecified with sedative, hypnotic or anxiolytic-induced mood disorder: Secondary | ICD-10-CM | POA: Diagnosis not present

## 2016-11-19 DIAGNOSIS — F3130 Bipolar disorder, current episode depressed, mild or moderate severity, unspecified: Secondary | ICD-10-CM

## 2016-11-19 MED ORDER — DIVALPROEX SODIUM 250 MG PO DR TAB: 250.0000 mg | DELAYED_RELEASE_TABLET | Freq: Two times a day (BID) | ORAL | 2 refills | Status: AC

## 2016-11-19 MED ORDER — ALPRAZOLAM 0.25 MG PO TABS: 0.2500 mg | ORAL_TABLET | Freq: Every evening | ORAL | 0 refills | Status: DC | PRN

## 2016-11-19 MED ORDER — QUETIAPINE FUMARATE ER 300 MG PO TB24
300.0000 mg | ORAL_TABLET | Freq: Every day | ORAL | 2 refills | Status: DC
Start: 2016-11-19 — End: 2017-02-13

## 2016-11-19 MED ORDER — ESCITALOPRAM OXALATE 20 MG PO TABS: 20.0000 mg | ORAL_TABLET | Freq: Every day | ORAL | 2 refills | Status: DC

## 2016-11-19 NOTE — Telephone Encounter (Signed)
pt was seen today by dr. Gretel Acre.

## 2016-11-19 NOTE — Progress Notes (Signed)
Patient ID: Nicole Austin, female   DOB: Nov 07, 1960, 56 y.o.   MRN: [id number] [facility] MD/PA/NP OP Progress Note  11/19/2016 10:30 AM Ashika Apuzzo  MRN:  [id number]  Subjective:  Patient is a 56 year old Caucasian female with history of bipolar disorder and posttraumatic stress disorder who was seen for follow-up. She remains focused on the prescription of Ativan/Xanax. She has been in the ER since last appointment and was seen there for chest pain and has received prescriptions of Xanax and Ativan. She was last seen on September 11. She reported that she was given 10 day supply of Ativan. Patient reported that she has already made an appointment at [facility] with Dr. Collie Siad on October 11 and she needs a prescription of Xanax till her next appointment. She reported that she WILL GET A PRESCRIPTION TODAY. She was adamant about her benzodiazepine prescription throughout the interview. Patient reported that she has changed her pharmacy as they were not giving her the right dosage of the medications.   Patient reported that she has been compliant with her other medications. She denied having any suicidal homicidal ideations or plans.       Chief Complaint:  Chief Complaint    Follow-up; Medication Refill     Visit Diagnosis:     ICD-10-CM   1. Bipolar I disorder, most recent episode depressed ([place]) F31.30   2. Sedative, hypnotic or anxiolytic-induced mood disorder ([place]) F13.94     Past Medical History:  Past Medical History:  Diagnosis Date  . ADHD (attention deficit hyperactivity disorder)   . Arthritis   . Asthma   . Bipolar 1 disorder ([place])   . COPD (chronic obstructive pulmonary disease) ([place])   . Depression   . GERD (gastroesophageal reflux disease) 08/30/2015  . History of acute myocardial infarction 06/30/2015   Overview:  [facility]   . Low HDL (under 40) 08/30/2015  . MI (myocardial infarction) ([place])   . Prediabetes 05/20/2016  . PTSD (post-traumatic stress disorder)   . Rhabdomyolysis    . Stroke [facility])     Past Surgical History:  Procedure Laterality Date  . BACK SURGERY    . REPLACEMENT TOTAL KNEE Left    Family History:  Family History  Problem Relation Age of Onset  . Hernia Mother   . Heart disease Mother   . OCD Mother   . Diabetes Mother   . Parkinson's disease Father   . Bipolar disorder Sister   . Schizophrenia Sister   . ADD / ADHD Sister   . Alcohol abuse Brother   . Bipolar disorder Sister   . Paranoid behavior Sister   . ADD / ADHD Sister   . ADD / ADHD Son   . Dementia Maternal Grandmother   . Emphysema Maternal Grandfather   . ADD / ADHD Son   . ADD / ADHD Son   . Depression Son    Social History:  Social History   Social History  . Marital status: Single    Spouse name: N/A  . Number of children: N/A  . Years of education: N/A   Social History Main Topics  . Smoking status: Current Every Day Smoker    Packs/day: 1.00    Years: 36.00    Types: Cigarettes    Start date: 10/11/1984  . Smokeless tobacco: Never Used  . Alcohol use No  . Drug use: No  . Sexual activity: No   Other Topics Concern  . None   Social History Narrative  .  None   Additional History:   Assessment:   Musculoskeletal: Strength & Muscle Tone: within normal limits Gait & Station: limping Patient leans: N/A  Psychiatric Specialty Exam: Medication Refill   Anxiety  Symptoms include insomnia and nervous/anxious behavior. Patient reports no suicidal ideas.    Depression         Associated symptoms include insomnia.  Associated symptoms include no suicidal ideas.  Past medical history includes anxiety.   Insomnia  PMH includes: depression.    Review of Systems  Psychiatric/Behavioral: Positive for depression. Negative for hallucinations, memory loss, substance abuse and suicidal ideas. The patient is nervous/anxious and has insomnia.     Blood pressure 120/78, pulse (!) 112, temperature 98.7 F (37.1 C), temperature source Oral, weight 259 lb  (117.5 kg).Body mass index is 48.94 kg/m.  General Appearance:fair   Eye Contact:  Good  Speech:  Pressured  Volume:  Increased  Mood:  Anxious and Depressed  Affect:  Constricted   Thought Process:  Goal Directed  Orientation:  Full (Time, Place, and Person)  Thought Content:  Negative  Suicidal Thoughts:  No  Homicidal Thoughts:  No  Memory:  Immediate;   Good Recent;   Good Remote;   Good  Judgement:  Good  Insight:  Good  Psychomotor Activity:  Negative  Concentration:  Good  Recall:  Good  Fund of Knowledge: Good  Language: Good  Akathisia:  Negative  Handed:  Right unknown  AIMS (if indicated):  Not done  Assets:  Communication Skills Housing Social Support  ADL's:  Intact  Cognition: WNL  Sleep:  Good   Is the patient at risk to self?  No. Has the patient been a risk to self in the past 6 months?  No. Has the patient been a risk to self within the distant past?  Yes.   Is the patient a risk to others?  No. Has the patient been a risk to others in the past 6 months?  No. Has the patient been a risk to others within the distant past?  No.  Current Medications: Current Outpatient Prescriptions  Medication Sig Dispense Refill  . acetaminophen (TYLENOL) 500 MG tablet Take 500 mg by mouth every 6 (six) hours as needed.    Marland Kitchen albuterol (PROVENTIL HFA;VENTOLIN HFA) 108 (90 Base) MCG/ACT inhaler Inhale 2 puffs into the lungs every 6 (six) hours as needed for wheezing or shortness of breath. 1 Inhaler 0  . albuterol (PROVENTIL) (2.5 MG/3ML) 0.083% nebulizer solution Take 3 mLs (2.5 mg total) by nebulization every 4 (four) hours as needed for wheezing or shortness of breath. 75 mL 2  . ASPIRIN LOW DOSE 81 MG EC tablet Take 1 tablet by mouth daily.    Marland Kitchen aspirin-acetaminophen-caffeine (EXCEDRIN MIGRAINE) 250-250-65 MG tablet Take by mouth every 6 (six) hours as needed for headache.    Marland Kitchen atorvastatin (LIPITOR) 40 MG tablet TAKE 1 TABLET BY MOUTH AT BEDTIME 30 tablet 1  .  divalproex (DEPAKOTE) 250 MG DR tablet Take 1 tablet (250 mg total) by mouth 2 (two) times daily. 60 tablet 2  . escitalopram (LEXAPRO) 20 MG tablet Take 1 tablet (20 mg total) by mouth daily. 30 tablet 2  . lidocaine (XYLOCAINE) 5 % ointment Apply 1 application topically as needed for mild pain.    . Multiple Vitamin (MULTIVITAMIN WITH MINERALS) TABS tablet Take 1 tablet by mouth daily.    . potassium chloride (KLOR-CON 10) 10 MEQ tablet One by mouth every three days 10 tablet  2  . QUEtiapine (SEROQUEL XR) 300 MG 24 hr tablet Take 1 tablet (300 mg total) by mouth at bedtime. 30 tablet 2  . ranitidine (ZANTAC) 150 MG tablet Take 1 tablet (150 mg total) by mouth 2 (two) times daily. 60 tablet 2  . Respiratory Therapy Supplies (NEBULIZER COMPRESSOR) KIT One nebulizer kit/machine with tubing; for use with nebulized albuterol 1 each 0  . SPIRIVA RESPIMAT 2.5 MCG/ACT AERS INHALE 2 PUFFS INTO LUNGS DAILY 4 g 6  . traMADol (ULTRAM) 50 MG tablet Take 1 tablet (50 mg total) by mouth every 6 (six) hours as needed. 12 tablet 0  . ALPRAZolam (XANAX) 0.25 MG tablet Take 1 tablet (0.25 mg total) by mouth at bedtime as needed for anxiety. 30 tablet 0  . furosemide (LASIX) 20 MG tablet One by mouth every three days for swelling 30 tablet 2   No current facility-administered medications for this visit.     Medical Decision Making:  Established Problem, Stable/Improving (1) and Review or order clinical lab tests (1)  Treatment Plan Summary:Medication management    Bipolar disorder-continue Seroquel XR at 300 mg at bedtime.    Continue  Xanax 0.25 mg qhs - one month supply given. Continue her Lexapro 20 mg a day.  She is on Depakote 250 mg twice a day   Patient will follow up at [facility] with Dr. Collie Siad.    More than 50% of the time spent in psychoeducation, counseling and coordination of care.    This note was generated in part or whole with voice recognition software. Voice regonition is  usually quite accurate but there are transcription errors that can and very often do occur. I apologize for any typographical errors that were not detected and corrected.    Rainey Pines, MD  11/19/2016, 10:30 AM

## 2016-11-19 NOTE — Telephone Encounter (Signed)
ALPRAZolam (XANAX) 0.25 MG tablet  Medication  Date: 11/19/2016 Department: [facility] Ordering/Authorizing: Rainey Pines, MD  Order Providers   Prescribing Provider Encounter Provider  Rainey Pines, MD Rainey Pines, MD  Medication Detail    Disp Refills Start End   ALPRAZolam Duanne Moron) 0.25 MG tablet 30 tablet 0 11/19/2016    Sig - Route: Take 1 tablet (0.25 mg total) by mouth at bedtime as needed for anxiety. - Oral   Class: Print

## 2016-11-29 NOTE — Telephone Encounter (Signed)
PLEASE SEE PREVIOUS MESSAGE. PLEASE ADVISE

## 2016-12-03 NOTE — Telephone Encounter (Signed)
Pt will follow up at [facility].

## 2016-12-12 DIAGNOSIS — Z1389 Encounter for screening for other disorder: Secondary | ICD-10-CM | POA: Diagnosis not present

## 2016-12-12 DIAGNOSIS — Z79899 Other long term (current) drug therapy: Secondary | ICD-10-CM | POA: Diagnosis not present

## 2017-01-02 ENCOUNTER — Other Ambulatory Visit: Payer: Self-pay

## 2017-01-02 ENCOUNTER — Inpatient Hospital Stay
Admission: EM | Admit: 2017-01-02 | Discharge: 2017-01-05 | DRG: 191 | Disposition: A | Discharge status: Home / Self Care | Payer: Medicare Other | Attending: Internal Medicine | Admitting: Internal Medicine

## 2017-01-02 ENCOUNTER — Emergency Department: Payer: Medicare Other

## 2017-01-02 ENCOUNTER — Encounter: Payer: Self-pay | Admitting: Emergency Medicine

## 2017-01-02 DIAGNOSIS — R7303 Prediabetes: Secondary | ICD-10-CM | POA: Diagnosis present

## 2017-01-02 DIAGNOSIS — G4730 Sleep apnea, unspecified: Secondary | ICD-10-CM | POA: Diagnosis present

## 2017-01-02 DIAGNOSIS — K219 Gastro-esophageal reflux disease without esophagitis: Secondary | ICD-10-CM | POA: Diagnosis present

## 2017-01-02 DIAGNOSIS — Z8249 Family history of ischemic heart disease and other diseases of the circulatory system: Secondary | ICD-10-CM | POA: Diagnosis not present

## 2017-01-02 DIAGNOSIS — Z888 Allergy status to other drugs, medicaments and biological substances status: Secondary | ICD-10-CM

## 2017-01-02 DIAGNOSIS — E876 Hypokalemia: Secondary | ICD-10-CM | POA: Diagnosis not present

## 2017-01-02 DIAGNOSIS — Z96652 Presence of left artificial knee joint: Secondary | ICD-10-CM | POA: Diagnosis present

## 2017-01-02 DIAGNOSIS — F17210 Nicotine dependence, cigarettes, uncomplicated: Secondary | ICD-10-CM | POA: Diagnosis present

## 2017-01-02 DIAGNOSIS — J441 Chronic obstructive pulmonary disease with (acute) exacerbation: Secondary | ICD-10-CM | POA: Diagnosis not present

## 2017-01-02 DIAGNOSIS — Z833 Family history of diabetes mellitus: Secondary | ICD-10-CM

## 2017-01-02 DIAGNOSIS — M6282 Rhabdomyolysis: Secondary | ICD-10-CM | POA: Diagnosis present

## 2017-01-02 DIAGNOSIS — Z7982 Long term (current) use of aspirin: Secondary | ICD-10-CM

## 2017-01-02 DIAGNOSIS — Z8673 Personal history of transient ischemic attack (TIA), and cerebral infarction without residual deficits: Secondary | ICD-10-CM

## 2017-01-02 DIAGNOSIS — M1990 Unspecified osteoarthritis, unspecified site: Secondary | ICD-10-CM | POA: Diagnosis present

## 2017-01-02 DIAGNOSIS — Z818 Family history of other mental and behavioral disorders: Secondary | ICD-10-CM | POA: Diagnosis not present

## 2017-01-02 DIAGNOSIS — Z79899 Other long term (current) drug therapy: Secondary | ICD-10-CM | POA: Diagnosis not present

## 2017-01-02 DIAGNOSIS — F909 Attention-deficit hyperactivity disorder, unspecified type: Secondary | ICD-10-CM | POA: Diagnosis present

## 2017-01-02 DIAGNOSIS — R0600 Dyspnea, unspecified: Secondary | ICD-10-CM | POA: Diagnosis not present

## 2017-01-02 DIAGNOSIS — Z825 Family history of asthma and other chronic lower respiratory diseases: Secondary | ICD-10-CM | POA: Diagnosis not present

## 2017-01-02 DIAGNOSIS — I252 Old myocardial infarction: Secondary | ICD-10-CM

## 2017-01-02 DIAGNOSIS — R0902 Hypoxemia: Secondary | ICD-10-CM | POA: Diagnosis not present

## 2017-01-02 DIAGNOSIS — F3130 Bipolar disorder, current episode depressed, mild or moderate severity, unspecified: Secondary | ICD-10-CM | POA: Diagnosis present

## 2017-01-02 DIAGNOSIS — E785 Hyperlipidemia, unspecified: Secondary | ICD-10-CM | POA: Diagnosis not present

## 2017-01-02 DIAGNOSIS — F4310 Post-traumatic stress disorder, unspecified: Secondary | ICD-10-CM | POA: Diagnosis present

## 2017-01-02 DIAGNOSIS — J449 Chronic obstructive pulmonary disease, unspecified: Secondary | ICD-10-CM | POA: Diagnosis not present

## 2017-01-02 DIAGNOSIS — Z720 Tobacco use: Secondary | ICD-10-CM | POA: Diagnosis not present

## 2017-01-02 LAB — BRAIN NATRIURETIC PEPTIDE: B NATRIURETIC PEPTIDE 5: 52 pg/mL (ref 0.0–100.0)

## 2017-01-02 LAB — BASIC METABOLIC PANEL
Anion gap: 11 (ref 5–15)
BUN: 12 mg/dL (ref 6–20)
CALCIUM: 8.5 mg/dL — AB (ref 8.9–10.3)
CO2: 30 mmol/L (ref 22–32)
CREATININE: 1.1 mg/dL — AB (ref 0.44–1.00)
Chloride: 98 mmol/L — ABNORMAL LOW (ref 101–111)
GFR, EST NON AFRICAN AMERICAN: 55 mL/min — AB (ref >60)
Glucose, Bld: 112 mg/dL — ABNORMAL HIGH (ref 65–99)
Potassium: 3.2 mmol/L — ABNORMAL LOW (ref 3.5–5.1)
SODIUM: 139 mmol/L (ref 135–145)

## 2017-01-02 LAB — TROPONIN I

## 2017-01-02 LAB — BLOOD GAS, VENOUS
Acid-Base Excess: 9.5 mmol/L — ABNORMAL HIGH (ref 0.0–2.0)
Bicarbonate: 35.8 mmol/L — ABNORMAL HIGH (ref 20.0–28.0)
O2 Saturation: 97.9 %
PATIENT TEMPERATURE: 37
PCO2 VEN: 54 mmHg (ref 44.0–60.0)
PH VEN: 7.43 (ref 7.250–7.430)
PO2 VEN: 100 mmHg — AB (ref 32.0–45.0)

## 2017-01-02 LAB — CBC
HCT: 41.2 % (ref 35.0–47.0)
Hemoglobin: 13.8 g/dL (ref 12.0–16.0)
MCH: 30.7 pg (ref 26.0–34.0)
MCHC: 33.4 g/dL (ref 32.0–36.0)
MCV: 92 fL (ref 80.0–100.0)
PLATELETS: 190 10*3/uL (ref 150–440)
RBC: 4.48 MIL/uL (ref 3.80–5.20)
RDW: 13.5 % (ref 11.5–14.5)
WBC: 7.2 10*3/uL (ref 3.6–11.0)

## 2017-01-02 MED ORDER — ALBUTEROL SULFATE (2.5 MG/3ML) 0.083% IN NEBU
2.5000 mg | INHALATION_SOLUTION | Freq: Once | RESPIRATORY_TRACT | Status: AC
  Administered 2017-01-02: 2.5 mg via RESPIRATORY_TRACT
  Filled 2017-01-02: qty 3

## 2017-01-02 MED ORDER — IPRATROPIUM-ALBUTEROL 0.5-2.5 (3) MG/3ML IN SOLN
3.0000 mL | Freq: Once | RESPIRATORY_TRACT | Status: AC
  Administered 2017-01-02: 3 mL via RESPIRATORY_TRACT
  Filled 2017-01-02: qty 6

## 2017-01-02 MED ORDER — METHYLPREDNISOLONE SODIUM SUCC 125 MG IJ SOLR
125.0000 mg | Freq: Once | INTRAMUSCULAR | Status: AC
  Administered 2017-01-02: 125 mg via INTRAVENOUS
  Filled 2017-01-02: qty 2

## 2017-01-02 MED ORDER — ALBUTEROL SULFATE (2.5 MG/3ML) 0.083% IN NEBU: 5.0000 mg | INHALATION_SOLUTION | Freq: Once | RESPIRATORY_TRACT | Status: DC

## 2017-01-02 MED ORDER — IPRATROPIUM-ALBUTEROL 0.5-2.5 (3) MG/3ML IN SOLN
3.0000 mL | Freq: Once | RESPIRATORY_TRACT | Status: AC
  Administered 2017-01-02: 3 mL via RESPIRATORY_TRACT

## 2017-01-02 NOTE — H&P (Signed)
[facility] [place] [facility]   PATIENT NAME: Nicole Austin    MR#:  [id number]  DATE OF BIRTH:  1960-04-06  DATE OF ADMISSION:  01/02/2017  PRIMARY CARE PHYSICIAN: Kerman Passey, MD   REQUESTING/REFERRING PHYSICIAN: Marisa Severin, MD  CHIEF COMPLAINT:   Chief Complaint  Patient presents with  . COPD  . Shortness of Breath    HISTORY OF PRESENT ILLNESS:  Nicole Austin  is a 56 y.o. female who presents with 2 days of progressive shortness of breath and wheezing.  Patient has known history of COPD and has been hospitalized for the same in the past.  She states that several months ago her nebulizer machine stopped working, so she was not able to use any nebulizer treatments at home.  Initial workup here did not indicate infection, she was treated for COPD exacerbation, and hospitalists were called for admission  PAST MEDICAL HISTORY:   Past Medical History:  Diagnosis Date  . ADHD (attention deficit hyperactivity disorder)   . Arthritis   . Asthma   . Bipolar 1 disorder (HCC)   . COPD (chronic obstructive pulmonary disease) (HCC)   . Depression   . GERD (gastroesophageal reflux disease) 08/30/2015  . History of acute myocardial infarction 06/30/2015   Overview:  [facility]   . Low HDL (under 40) 08/30/2015  . MI (myocardial infarction) (HCC)   . Prediabetes 05/20/2016  . PTSD (post-traumatic stress disorder)   . Rhabdomyolysis   . Stroke [facility])     PAST SURGICAL HISTORY:   Past Surgical History:  Procedure Laterality Date  . BACK SURGERY    . REPLACEMENT TOTAL KNEE Left     SOCIAL HISTORY:   Social History   Tobacco Use  . Smoking status: Current Every Day Smoker    Packs/day: 1.00    Years: 36.00    Pack years: 36.00    Types: Cigarettes    Start date: 10/11/1984  . Smokeless tobacco: Never Used  Substance Use Topics  . Alcohol use: No    Alcohol/week: 0.0 oz    FAMILY HISTORY:   Family History  Problem Relation Age of Onset  . Hernia  Mother   . Heart disease Mother   . OCD Mother   . Diabetes Mother   . Parkinson's disease Father   . Bipolar disorder Sister   . Schizophrenia Sister   . ADD / ADHD Sister   . Alcohol abuse Brother   . Bipolar disorder Sister   . Paranoid behavior Sister   . ADD / ADHD Sister   . ADD / ADHD Son   . Dementia Maternal Grandmother   . Emphysema Maternal Grandfather   . ADD / ADHD Son   . ADD / ADHD Son   . Depression Son     DRUG ALLERGIES:   Allergies  Allergen Reactions  . Chantix [Varenicline] Nausea Only    MEDICATIONS AT HOME:   Prior to Admission medications   Medication Sig Start Date End Date Taking? Authorizing Provider  acetaminophen (TYLENOL) 500 MG tablet Take 500 mg by mouth every 6 (six) hours as needed.   Yes [provider]  albuterol (PROVENTIL HFA;VENTOLIN HFA) 108 (90 Base) MCG/ACT inhaler Inhale 2 puffs into the lungs every 6 (six) hours as needed for wheezing or shortness of breath. 05/31/15  Yes Myrna Blazer, MD  albuterol (PROVENTIL) (2.5 MG/3ML) 0.083% nebulizer solution Take 3 mLs (2.5 mg total) by nebulization every 4 (four) hours as needed for  wheezing or shortness of breath. 03/01/16  Yes Auburn Bilberry, MD  ALPRAZolam Prudy Feeler) 0.5 MG tablet Take 0.5 mg 3 (three) times daily as needed by mouth for anxiety or sleep.   Yes [provider]  atorvastatin (LIPITOR) 40 MG tablet TAKE 1 TABLET BY MOUTH AT BEDTIME 11/19/16  Yes Lada, Janit Bern, MD  divalproex (DEPAKOTE) 250 MG DR tablet Take 1 tablet (250 mg total) by mouth 2 (two) times daily. 11/19/16  Yes Brandy Hale, MD  escitalopram (LEXAPRO) 20 MG tablet Take 1 tablet (20 mg total) by mouth daily. 11/19/16  Yes Brandy Hale, MD  furosemide (LASIX) 20 MG tablet One by mouth every three days for swelling 07/30/16 01/02/18 Yes Brandy Hale, MD  lurasidone (LATUDA) 40 MG TABS tablet Take 40 mg every evening by mouth.   Yes [provider]  Multiple Vitamin (MULTIVITAMIN WITH  MINERALS) TABS tablet Take 1 tablet by mouth daily.   Yes [provider]  potassium chloride (KLOR-CON 10) 10 MEQ tablet One by mouth every three days 10/16/16  Yes Lada, Janit Bern, MD  ranitidine (ZANTAC) 150 MG tablet Take 1 tablet (150 mg total) by mouth 2 (two) times daily. 08/29/15  Yes Lada, Janit Bern, MD  SPIRIVA RESPIMAT 2.5 MCG/ACT AERS INHALE 2 PUFFS INTO LUNGS DAILY 10/26/16  Yes Lada, Janit Bern, MD  aspirin-acetaminophen-caffeine (EXCEDRIN MIGRAINE) [phone] MG tablet Take by mouth every 6 (six) hours as needed for headache.    [provider]  QUEtiapine (SEROQUEL XR) 300 MG 24 hr tablet Take 1 tablet (300 mg total) by mouth at bedtime. 11/19/16   Brandy Hale, MD  Respiratory Therapy Supplies (NEBULIZER COMPRESSOR) KIT One nebulizer kit/machine with tubing; for use with nebulized albuterol 02/10/16   Lada, Janit Bern, MD  traMADol (ULTRAM) 50 MG tablet Take 1 tablet (50 mg total) by mouth every 6 (six) hours as needed. Patient not taking: Reported on 01/02/2017 07/09/16   Kem Boroughs B, FNP    REVIEW OF SYSTEMS:  Review of Systems  Constitutional: Negative for chills, fever, malaise/fatigue and weight loss.  HENT: Negative for ear pain, hearing loss and tinnitus.   Eyes: Negative for blurred vision, double vision, pain and redness.  Respiratory: Positive for cough, shortness of breath and wheezing. Negative for hemoptysis.   Cardiovascular: Negative for chest pain, palpitations, orthopnea and leg swelling.  Gastrointestinal: Negative for abdominal pain, constipation, diarrhea, nausea and vomiting.  Genitourinary: Negative for dysuria, frequency and hematuria.  Musculoskeletal: Negative for back pain, joint pain and neck pain.  Skin:       No acne, rash, or lesions  Neurological: Negative for dizziness, tremors, focal weakness and weakness.  Endo/Heme/Allergies: Negative for polydipsia. Does not bruise/bleed easily.  Psychiatric/Behavioral: Negative for  depression. The patient is not nervous/anxious and does not have insomnia.      VITAL SIGNS:   Vitals:   01/02/17 2111 01/02/17 2304  BP: 140/68 118/62  Pulse: 79 81  Resp: 16 17  Temp: 97.9 F (36.6 C)   TempSrc: Oral   SpO2: (!) 89% 100%   Wt Readings from Last 3 Encounters:  11/06/16 116.6 kg (257 lb)  10/16/16 116.7 kg (257 lb 3.2 oz)  10/04/16 116.1 kg (256 lb)    PHYSICAL EXAMINATION:  Physical Exam  Vitals reviewed. Constitutional: She is oriented to person, place, and time. She appears well-developed and well-nourished. No distress.  HENT:  Head: Normocephalic and atraumatic.  Mouth/Throat: Oropharynx is clear and moist.  Eyes: Conjunctivae and EOM are  normal. Pupils are equal, round, and reactive to light. No scleral icterus.  Neck: Normal range of motion. Neck supple. No JVD present. No thyromegaly present.  Cardiovascular: Normal rate, regular rhythm and intact distal pulses. Exam reveals no gallop and no friction rub.  No murmur heard. Respiratory: She is in respiratory distress (Requiring nonrebreather). She has wheezes. She has no rales.  GI: Soft. Bowel sounds are normal. She exhibits no distension. There is no tenderness.  Musculoskeletal: Normal range of motion. She exhibits no edema.  No arthritis, no gout  Lymphadenopathy:    She has no cervical adenopathy.  Neurological: She is alert and oriented to person, place, and time. No cranial nerve deficit.  No dysarthria, no aphasia  Skin: Skin is warm and dry. No rash noted. No erythema.  Psychiatric: She has a normal mood and affect. Her behavior is normal. Judgment and thought content normal.    LABORATORY PANEL:   CBC Recent Labs  Lab 01/02/17 2141  WBC 7.2  HGB 13.8  HCT 41.2  PLT 190   ------------------------------------------------------------------------------------------------------------------  Chemistries  Recent Labs  Lab 01/02/17 2141  NA 139  K 3.2*  CL 98*  CO2 30  GLUCOSE  112*  BUN 12  CREATININE 1.10*  CALCIUM 8.5*   ------------------------------------------------------------------------------------------------------------------  Cardiac Enzymes Recent Labs  Lab 01/02/17 2141  TROPONINI <0.03   ------------------------------------------------------------------------------------------------------------------  RADIOLOGY:  Dg Chest Portable 1 View  Result Date: 01/02/2017 CLINICAL DATA:  Dyspnea and wheezing today.  History of COPD. EXAM: PORTABLE CHEST 1 VIEW COMPARISON:  11/06/2016 FINDINGS: Mildly enlarged but stable cardiopericardial silhouette. No aortic aneurysm. Both lungs are clear. The visualized skeletal structures are unremarkable. IMPRESSION: No active disease. Electronically Signed   By: Tollie Eth M.D.   On: 01/02/2017 21:58    EKG:   Orders placed or performed during the hospital encounter of 01/02/17  . ED EKG  . ED EKG    IMPRESSION AND PLAN:  Principal Problem:   COPD exacerbation (HCC) -IV steroid, duo nebs, azithromycin, continue home inhalers Active Problems:   HLD (hyperlipidemia) -continue home meds   Bipolar affective disorder, current episode depressed (HCC) -home medications   GERD (gastroesophageal reflux disease) -home dose H2 blocker  All the records are reviewed and case discussed with ED provider. Management plans discussed with the patient and/or family.  DVT PROPHYLAXIS: SubQ lovenox  GI PROPHYLAXIS: H2 blocker  ADMISSION STATUS: Inpatient  CODE STATUS: Full Code Status History    Date Active Date Inactive Code Status Order ID Comments User Context   02/27/2016 20:49 03/01/2016 17:18 Full Code [id number]  Milagros Loll, MD ED   05/13/2015 04:03 05/14/2015 18:08 Full Code [id number]  Oralia Manis, MD ED   03/25/2015 21:18 03/28/2015 20:48 Full Code [id number]  Wyatt Haste, MD ED   03/02/2015 01:28 03/08/2015 13:49 Full Code [id number]  Wyatt Haste, MD ED   07/02/2014 15:15 07/03/2014 17:27 Full Code [id number]   Alford Highland, MD ED      TOTAL TIME TAKING CARE OF THIS PATIENT: 45 minutes.   Tamala Manzer FIELDING 01/02/2017, 11:55 PM  [facility]  Office  [phone]  CC: Primary care physician; Kerman Passey, MD  Note:  This document was prepared using Dragon voice recognition software and may include unintentional dictation errors.

## 2017-01-02 NOTE — ED Triage Notes (Signed)
Pt comes into the Ed via PV c/o shortness of breath and COPD.  Patient has wheezing at this time and labored breathing.  Patient has not been using her nebulizer and inhalers regularly at home.  Denies any )2 use at home.  Patient has soft spoken voice at this time, and has a dry non productive cough.

## 2017-01-02 NOTE — ED Provider Notes (Signed)
[facility] Emergency Department Provider Note ____________________________________________   First MD Initiated Contact with Patient 01/02/17 2126     (approximate)  I have reviewed the triage vital signs and the nursing notes.   HISTORY  Chief Complaint COPD and Shortness of Breath    HPI Nicole Austin is a 56 y.o. female with history of COPD, and other past medical history as noted below, who presents with shortness of breath, gradual onset, duration approximately 1 day, and worsening.  It is associated with wheeze and cough.  She also reports some chest discomfort.  She denies fever chills, vomiting, diarrhea, or urinary symptoms.  Unrelieved by albuterol at home.   Past Medical History:  Diagnosis Date  . ADHD (attention deficit hyperactivity disorder)   . Arthritis   . Asthma   . Bipolar 1 disorder ([place])   . COPD (chronic obstructive pulmonary disease) ([place])   . Depression   . GERD (gastroesophageal reflux disease) 08/30/2015  . History of acute myocardial infarction 06/30/2015   Overview:  [facility]   . Low HDL (under 40) 08/30/2015  . MI (myocardial infarction) ([place])   . Prediabetes 05/20/2016  . PTSD (post-traumatic stress disorder)   . Rhabdomyolysis   . Stroke [facility])     Patient Active Problem List   Diagnosis Date Noted  . Medication monitoring encounter 10/16/2016  . Prediabetes 05/20/2016  . Neuropathy, peripheral 05/07/2016  . Right sided sciatica 04/19/2016  . Pain in right knee 01/05/2016  . Trapezius muscle spasm 12/17/2015  . Facet hypertrophy of lumbar region 11/25/2015  . Fall at home 11/25/2015  . Abnormal CBC 11/09/2015  . Vitamin B12 deficiency 10/24/2015  . Fatigue 10/04/2015  . Encounter for medication monitoring 10/04/2015  . Hypoalbuminemia 10/04/2015  . Fibromyalgia 08/30/2015  . Low HDL (under 40) 08/30/2015  . GERD (gastroesophageal reflux disease) 08/30/2015  . Other specified behavioral and emotional  disorders with onset usually occurring in childhood and adolescence 07/13/2015  . OP (osteoporosis) 06/30/2015  . Pain in thoracic spine 06/30/2015  . Personal history of other diseases of the musculoskeletal system and connective tissue 06/30/2015  . Primary malignant neoplasm ([place]) 06/30/2015  . Apnea, sleep 06/30/2015  . Controlled substance agreement signed 06/21/2015  . Other long term (current) drug therapy 06/21/2015  . HLD (hyperlipidemia) 05/13/2015  . Foot ulcer, left ([place]) 04/27/2015  . Chronic pain disorder 04/27/2015  . Chronic pain associated with significant psychosocial dysfunction 04/27/2015  . Non-pressure ulcer of lower extremity ([place]) 04/27/2015  . Stroke ([place]) 03/25/2015  . Cerebral infarction ([place]) 03/25/2015  . Chest pain 12/31/2014  . Morbid obesity ([place]) 12/22/2014  . Bipolar affective disorder, current episode depressed ([place]) 12/22/2014  . Gout 08/16/2014  . Muscle spasms of both lower extremities 08/16/2014  . Spasm 08/16/2014  . Back pain, chronic 08/13/2014  . Low back pain with sciatica 08/04/2014  . Current tobacco use 08/04/2014  . Polysubstance abuse ([place]) 07/04/2014  . Psychoactive substance abuse ([place]) 07/04/2014  . Anxiety, generalized 11/24/2013  . Imbalance 11/24/2013  . Other abnormalities of gait and mobility 11/24/2013  . Moderate COPD (chronic obstructive pulmonary disease) ([place]) 11/18/2013  . HPV (human papilloma virus) infection 07/17/2013  . Arthritis of knee, degenerative 07/15/2013  . Personal history of other specified conditions 07/31/2011    Past Surgical History:  Procedure Laterality Date  . BACK SURGERY    . REPLACEMENT TOTAL KNEE Left     Prior to Admission medications   Medication Sig Start Date  End Date Taking? Authorizing Provider  acetaminophen (TYLENOL) 500 MG tablet Take 500 mg by mouth every 6 (six) hours as needed.    [provider]  albuterol (PROVENTIL HFA;VENTOLIN HFA) 108 (90 Base) MCG/ACT inhaler  Inhale 2 puffs into the lungs every 6 (six) hours as needed for wheezing or shortness of breath. 05/31/15   Orbie Pyo, MD  albuterol (PROVENTIL) (2.5 MG/3ML) 0.083% nebulizer solution Take 3 mLs (2.5 mg total) by nebulization every 4 (four) hours as needed for wheezing or shortness of breath. 03/01/16   Dustin Flock, MD  ALPRAZolam Duanne Moron) 0.25 MG tablet Take 1 tablet (0.25 mg total) by mouth at bedtime as needed for anxiety. 11/19/16   Rainey Pines, MD  ASPIRIN LOW DOSE 81 MG EC tablet Take 1 tablet by mouth daily. 04/04/16   [provider]  aspirin-acetaminophen-caffeine (EXCEDRIN MIGRAINE) [phone] MG tablet Take by mouth every 6 (six) hours as needed for headache.    [provider]  atorvastatin (LIPITOR) 40 MG tablet TAKE 1 TABLET BY MOUTH AT BEDTIME 11/19/16   Lada, Satira Anis, MD  divalproex (DEPAKOTE) 250 MG DR tablet Take 1 tablet (250 mg total) by mouth 2 (two) times daily. 11/19/16   Rainey Pines, MD  escitalopram (LEXAPRO) 20 MG tablet Take 1 tablet (20 mg total) by mouth daily. 11/19/16   Rainey Pines, MD  furosemide (LASIX) 20 MG tablet One by mouth every three days for swelling 07/30/16 10/16/16  Rainey Pines, MD  lidocaine (XYLOCAINE) 5 % ointment Apply 1 application topically as needed for mild pain.    [provider]  Multiple Vitamin (MULTIVITAMIN WITH MINERALS) TABS tablet Take 1 tablet by mouth daily.    [provider]  potassium chloride (KLOR-CON 10) 10 MEQ tablet One by mouth every three days 10/16/16   Arnetha Courser, MD  QUEtiapine (SEROQUEL XR) 300 MG 24 hr tablet Take 1 tablet (300 mg total) by mouth at bedtime. 11/19/16   Rainey Pines, MD  ranitidine (ZANTAC) 150 MG tablet Take 1 tablet (150 mg total) by mouth 2 (two) times daily. 08/29/15   Arnetha Courser, MD  Respiratory Therapy Supplies (NEBULIZER COMPRESSOR) KIT One nebulizer kit/machine with tubing; for use with nebulized albuterol 02/10/16   Lada, Satira Anis, MD  SPIRIVA  RESPIMAT 2.5 MCG/ACT AERS INHALE 2 PUFFS INTO LUNGS DAILY 10/26/16   Lada, Satira Anis, MD  traMADol (ULTRAM) 50 MG tablet Take 1 tablet (50 mg total) by mouth every 6 (six) hours as needed. 07/09/16   Victorino Dike, FNP    Allergies Chantix [varenicline]  Family History  Problem Relation Age of Onset  . Hernia Mother   . Heart disease Mother   . OCD Mother   . Diabetes Mother   . Parkinson's disease Father   . Bipolar disorder Sister   . Schizophrenia Sister   . ADD / ADHD Sister   . Alcohol abuse Brother   . Bipolar disorder Sister   . Paranoid behavior Sister   . ADD / ADHD Sister   . ADD / ADHD Son   . Dementia Maternal Grandmother   . Emphysema Maternal Grandfather   . ADD / ADHD Son   . ADD / ADHD Son   . Depression Son     Social History Social History   Tobacco Use  . Smoking status: Current Every Day Smoker    Packs/day: 1.00    Years: 36.00    Pack years: 36.00    Types: Cigarettes  Start date: 10/11/1984  . Smokeless tobacco: Never Used  Substance Use Topics  . Alcohol use: No    Alcohol/week: 0.0 oz  . Drug use: No    Review of Systems  Constitutional: No fever/chills Eyes: No visual changes. ENT: Positive for sore throat. Cardiovascular: Positive for chest pain. Respiratory: Positive for shortness of breath. Gastrointestinal: No nausea, no vomiting.  No diarrhea.  Genitourinary: Negative for dysuria.  Musculoskeletal: Negative for back pain. Skin: Negative for rash. Neurological: Negative for headache.    ____________________________________________   PHYSICAL EXAM:  VITAL SIGNS: ED Triage Vitals [01/02/17 2111]  Enc Vitals Group     BP 140/68     Pulse Rate 79     Resp 16     Temp 97.9 F (36.6 C)     Temp Source Oral     SpO2 (!) 89 %     Weight      Height      Head Circumference      Peak Flow      Pain Score      Pain Loc      Pain Edu?      Excl. in [place]?     Constitutional: Alert and oriented. Uncomfortable  appearing. Eyes: Conjunctivae are normal.  Head: Atraumatic. Nose: No congestion/rhinnorhea. Mouth/Throat: Mucous membranes are slightly dry.  Oropharynx clear, with slight erythema but no swelling or exudates.    Neck: Normal range of motion.  Cardiovascular: Normal rate, regular rhythm. Grossly normal heart sounds.  Good peripheral circulation. Respiratory: Increased respiratory effort.  Bilateral moderate wheezing.  Gastrointestinal: Soft and nontender. No distention.  Genitourinary: No flank tenderness. Musculoskeletal: No lower extremity edema.  Extremities warm and well perfused.  Neurologic:  Normal speech and language. No gross focal neurologic deficits are appreciated.  Skin:  Skin is warm and dry. No rash noted. Psychiatric: Mood and affect are normal. Speech and behavior are normal.  ____________________________________________   LABS (all labs ordered are listed, but only abnormal results are displayed)  Labs Reviewed  BASIC METABOLIC PANEL - Abnormal; Notable for the following components:      Result Value   Potassium 3.2 (*)    Chloride 98 (*)    Glucose, Bld 112 (*)    Creatinine, Ser 1.10 (*)    Calcium 8.5 (*)    GFR calc non Af Amer 55 (*)    All other components within normal limits  BLOOD GAS, VENOUS - Abnormal; Notable for the following components:   pO2, Ven 100.0 (*)    Bicarbonate 35.8 (*)    Acid-Base Excess 9.5 (*)    All other components within normal limits  CBC  BRAIN NATRIURETIC PEPTIDE  TROPONIN I   ____________________________________________  EKG  ED ECG REPORT I, Arta Silence, the attending physician, personally viewed and interpreted this ECG.  Date: 01/02/2017 EKG Time: 2123 Rate: 87 Rhythm: normal sinus rhythm QRS Axis: normal Intervals: normal ST/T Wave abnormalities: T wave inversions V1 through V3, and ST flattening in lead III Narrative Interpretation: no evidence of acute ischemia; no significant change when compared  to EKG of 11/07/2016  ____________________________________________  RADIOLOGY  CXR: No focal infiltrate.   ____________________________________________   PROCEDURES  Procedure(s) performed: No    Critical Care performed: No ____________________________________________   INITIAL IMPRESSION / ASSESSMENT AND PLAN / ED COURSE  Pertinent labs & imaging results that were available during my care of the patient were reviewed by me and considered in my medical decision making (see  chart for details).  56 year old female with past medical history of COPD and other medical history as noted above presents with 1 day of worsening shortness of breath with wheeze.  In the ED, vital signs are normal except for hypoxia, patient is acutely short of breath and uncomfortable appearing with bilateral wheeze, but no other significant findings.  On review of past medical records in [place], patient was last seen in the emergency department on 11/06/2016 for chest pain and had negative workup and was discharged.  He was last admitted early this year for COPD exacerbation.  Differential includes most likely acute on chronic COPD exacerbation, versus acute bronchitis or pneumonia.  Low susp for acute cardiac cause.  Plan: CXR, nebs, steroid, labs, reassess.     ----------------------------------------- 11:09 PM on 01/02/2017 -----------------------------------------  On reassessment, patient still has significant wheeze and when taken off O2 her O2 sat is 88-89%.  Her respiratory effort has slightly improved.  At this time patient will need admission for COPD exacerbation.  Chest x-ray and cardiac workup are negative.  I discussed the case and signed out to the hospitalist, Dr. Jannifer Franklin.  ____________________________________________   FINAL CLINICAL IMPRESSION(S) / ED DIAGNOSES  Final diagnoses:  COPD exacerbation ([place])  Hypoxia      NEW MEDICATIONS STARTED DURING THIS VISIT:  This SmartLink is  deprecated. Use AVSMEDLIST instead to display the medication list for a patient.   Note:  This document was prepared using Dragon voice recognition software and may include unintentional dictation errors.    Arta Silence, MD 01/02/17 2310

## 2017-01-03 ENCOUNTER — Other Ambulatory Visit: Payer: Self-pay

## 2017-01-03 LAB — CBC
HEMATOCRIT: 38.9 % (ref 35.0–47.0)
HEMOGLOBIN: 13.2 g/dL (ref 12.0–16.0)
MCH: 31.3 pg (ref 26.0–34.0)
MCHC: 34 g/dL (ref 32.0–36.0)
MCV: 92.2 fL (ref 80.0–100.0)
Platelets: 175 10*3/uL (ref 150–440)
RBC: 4.22 MIL/uL (ref 3.80–5.20)
RDW: 13.6 % (ref 11.5–14.5)
WBC: 4.5 10*3/uL (ref 3.6–11.0)

## 2017-01-03 LAB — BASIC METABOLIC PANEL
ANION GAP: 12 (ref 5–15)
BUN: 12 mg/dL (ref 6–20)
CHLORIDE: 96 mmol/L — AB (ref 101–111)
CO2: 30 mmol/L (ref 22–32)
Calcium: 8.5 mg/dL — ABNORMAL LOW (ref 8.9–10.3)
Creatinine, Ser: 1.06 mg/dL — ABNORMAL HIGH (ref 0.44–1.00)
GFR, EST NON AFRICAN AMERICAN: 57 mL/min — AB (ref >60)
Glucose, Bld: 226 mg/dL — ABNORMAL HIGH (ref 65–99)
POTASSIUM: 3.8 mmol/L (ref 3.5–5.1)
SODIUM: 138 mmol/L (ref 135–145)

## 2017-01-03 MED ORDER — IPRATROPIUM-ALBUTEROL 0.5-2.5 (3) MG/3ML IN SOLN: 3.0000 mL | RESPIRATORY_TRACT | Status: DC | PRN

## 2017-01-03 MED ORDER — MORPHINE SULFATE (PF) 2 MG/ML IV SOLN
2.0000 mg | INTRAVENOUS | Status: DC | PRN
  Administered 2017-01-03: 2 mg via INTRAVENOUS
  Filled 2017-01-03: qty 1

## 2017-01-03 MED ORDER — ALPRAZOLAM 0.5 MG PO TABS
0.5000 mg | ORAL_TABLET | Freq: Three times a day (TID) | ORAL | Status: DC | PRN
  Administered 2017-01-03 – 2017-01-05 (×5): 0.5 mg via ORAL
  Filled 2017-01-03 (×5): qty 1

## 2017-01-03 MED ORDER — OXYCODONE-ACETAMINOPHEN 5-325 MG PO TABS
1.0000 | ORAL_TABLET | Freq: Four times a day (QID) | ORAL | Status: DC | PRN
  Administered 2017-01-03 – 2017-01-05 (×8): 1 via ORAL
  Filled 2017-01-03 (×8): qty 1

## 2017-01-03 MED ORDER — ATORVASTATIN CALCIUM 20 MG PO TABS
40.0000 mg | ORAL_TABLET | Freq: Every day | ORAL | Status: DC
  Administered 2017-01-03 – 2017-01-04 (×3): 40 mg via ORAL
  Filled 2017-01-03 (×3): qty 2

## 2017-01-03 MED ORDER — NICOTINE 21 MG/24HR TD PT24
21.0000 mg | MEDICATED_PATCH | Freq: Every day | TRANSDERMAL | Status: DC
  Administered 2017-01-03 – 2017-01-05 (×3): 21 mg via TRANSDERMAL
  Filled 2017-01-03 (×4): qty 1

## 2017-01-03 MED ORDER — ONDANSETRON HCL 4 MG PO TABS: 4.0000 mg | ORAL_TABLET | Freq: Four times a day (QID) | ORAL | Status: DC | PRN

## 2017-01-03 MED ORDER — GUAIFENESIN-DM 100-10 MG/5ML PO SYRP
5.0000 mL | ORAL_SOLUTION | ORAL | Status: DC | PRN
  Administered 2017-01-03: 14:00:00 5 mL via ORAL
  Filled 2017-01-03 (×2): qty 5

## 2017-01-03 MED ORDER — ONDANSETRON HCL 4 MG/2ML IJ SOLN: 4.0000 mg | Freq: Four times a day (QID) | INTRAMUSCULAR | Status: DC | PRN

## 2017-01-03 MED ORDER — TIOTROPIUM BROMIDE MONOHYDRATE 2.5 MCG/ACT IN AERS: 2.5000 ug | INHALATION_SPRAY | Freq: Two times a day (BID) | RESPIRATORY_TRACT | Status: DC

## 2017-01-03 MED ORDER — ACETAMINOPHEN 650 MG RE SUPP: 650.0000 mg | Freq: Four times a day (QID) | RECTAL | Status: DC | PRN

## 2017-01-03 MED ORDER — ENOXAPARIN SODIUM 40 MG/0.4ML ~~LOC~~ SOLN
40.0000 mg | SUBCUTANEOUS | Status: DC
  Administered 2017-01-03: 08:00:00 40 mg via SUBCUTANEOUS
  Filled 2017-01-03: qty 0.4

## 2017-01-03 MED ORDER — METHYLPREDNISOLONE SODIUM SUCC 125 MG IJ SOLR
60.0000 mg | Freq: Four times a day (QID) | INTRAMUSCULAR | Status: DC
  Administered 2017-01-03 – 2017-01-05 (×10): 60 mg via INTRAVENOUS
  Filled 2017-01-03 (×10): qty 2

## 2017-01-03 MED ORDER — DIVALPROEX SODIUM 250 MG PO DR TAB
250.0000 mg | DELAYED_RELEASE_TABLET | Freq: Two times a day (BID) | ORAL | Status: DC
  Administered 2017-01-03 – 2017-01-05 (×6): 250 mg via ORAL
  Filled 2017-01-03 (×7): qty 1

## 2017-01-03 MED ORDER — ATORVASTATIN CALCIUM 20 MG PO TABS: 40.0000 mg | ORAL_TABLET | Freq: Every day | ORAL | Status: DC

## 2017-01-03 MED ORDER — ESCITALOPRAM OXALATE 10 MG PO TABS
20.0000 mg | ORAL_TABLET | Freq: Every day | ORAL | Status: DC
  Administered 2017-01-03 – 2017-01-05 (×3): 20 mg via ORAL
  Filled 2017-01-03 (×2): qty 2
  Filled 2017-01-03: qty 1

## 2017-01-03 MED ORDER — AZITHROMYCIN 500 MG PO TABS
500.0000 mg | ORAL_TABLET | Freq: Every day | ORAL | Status: DC
  Administered 2017-01-03 – 2017-01-05 (×3): 500 mg via ORAL
  Filled 2017-01-03 (×4): qty 1

## 2017-01-03 MED ORDER — QUETIAPINE FUMARATE ER 300 MG PO TB24
300.0000 mg | ORAL_TABLET | Freq: Every day | ORAL | Status: DC
  Administered 2017-01-03 – 2017-01-04 (×2): 300 mg via ORAL
  Filled 2017-01-03 (×3): qty 1

## 2017-01-03 MED ORDER — ENOXAPARIN SODIUM 40 MG/0.4ML ~~LOC~~ SOLN
40.0000 mg | Freq: Two times a day (BID) | SUBCUTANEOUS | Status: DC
  Administered 2017-01-03 – 2017-01-05 (×4): 40 mg via SUBCUTANEOUS
  Filled 2017-01-03 (×4): qty 0.4

## 2017-01-03 MED ORDER — GUAIFENESIN ER 600 MG PO TB12
600.0000 mg | ORAL_TABLET | Freq: Two times a day (BID) | ORAL | Status: DC
  Administered 2017-01-03 – 2017-01-05 (×4): 600 mg via ORAL
  Filled 2017-01-03 (×4): qty 1

## 2017-01-03 MED ORDER — IPRATROPIUM-ALBUTEROL 0.5-2.5 (3) MG/3ML IN SOLN
3.0000 mL | Freq: Four times a day (QID) | RESPIRATORY_TRACT | Status: DC
  Administered 2017-01-03 – 2017-01-05 (×7): 3 mL via RESPIRATORY_TRACT
  Filled 2017-01-03 (×7): qty 3

## 2017-01-03 MED ORDER — TIOTROPIUM BROMIDE MONOHYDRATE 18 MCG IN CAPS
18.0000 ug | ORAL_CAPSULE | Freq: Every day | RESPIRATORY_TRACT | Status: DC
  Administered 2017-01-03 – 2017-01-05 (×3): 18 ug via RESPIRATORY_TRACT
  Filled 2017-01-03: qty 5

## 2017-01-03 MED ORDER — LURASIDONE HCL 40 MG PO TABS
40.0000 mg | ORAL_TABLET | Freq: Every evening | ORAL | Status: DC
  Administered 2017-01-03 – 2017-01-04 (×3): 40 mg via ORAL
  Filled 2017-01-03 (×4): qty 1

## 2017-01-03 MED ORDER — ACETAMINOPHEN 325 MG PO TABS
650.0000 mg | ORAL_TABLET | Freq: Four times a day (QID) | ORAL | Status: DC | PRN
Start: 2017-01-03 — End: 2017-01-05
  Administered 2017-01-03: 650 mg via ORAL
  Filled 2017-01-03: qty 2

## 2017-01-03 MED ORDER — BUDESONIDE 0.25 MG/2ML IN SUSP
0.2500 mg | Freq: Two times a day (BID) | RESPIRATORY_TRACT | Status: DC
  Administered 2017-01-03 – 2017-01-05 (×4): 0.25 mg via RESPIRATORY_TRACT
  Filled 2017-01-03 (×4): qty 2

## 2017-01-03 NOTE — Progress Notes (Signed)
Order for enoxaparin 40 mg subcutaneously daily for DVT ppx changed to enoxaparin 40 mg q12h per protocol for BMI > 40 and CrCl > 30 mL/min.  Lenis Noon, PharmD 01/03/17 10:18 AM

## 2017-01-03 NOTE — Progress Notes (Signed)
[place] at [facility]                                                                                                                                                                                  Patient Demographics   Nicole Austin, is a 56 y.o. female, DOB - 04-27-1960, ZOX:[id number]  Admit date - 01/02/2017   Admitting Physician Lance Coon, MD  Outpatient Primary MD for the patient is Lada, Satira Anis, MD   LOS - 1  Subjective: Patient admitted with COPD exacerbation.  She is still short of breath but improved compared to yesterday. Complains of dry cough and wheezing    Review of Systems:   CONSTITUTIONAL: No documented fever. No fatigue, weakness. No weight gain, no weight loss.  EYES: No blurry or double vision.  ENT: No tinnitus. No postnasal drip. No redness of the oropharynx.  RESPIRATORY: Positive cough, positive wheeze, no hemoptysis. No dyspnea.  CARDIOVASCULAR: No chest pain. No orthopnea. No palpitations. No syncope.  GASTROINTESTINAL: No nausea, no vomiting or diarrhea. No abdominal pain. No melena or hematochezia.  GENITOURINARY: No dysuria or hematuria.  ENDOCRINE: No polyuria or nocturia. No heat or cold intolerance.  HEMATOLOGY: No anemia. No bruising. No bleeding.  INTEGUMENTARY: No rashes. No lesions.  MUSCULOSKELETAL: No arthritis. No swelling. No gout.  NEUROLOGIC: No numbness, tingling, or ataxia. No seizure-type activity.  PSYCHIATRIC: No anxiety. No insomnia. No ADD.    Vitals:   Vitals:   01/03/17 0420 01/03/17 0807 01/03/17 1100 01/03/17 1344  BP: (!) 121/52 (!) 128/58  (!) 121/51  Pulse: 74 65  68  Resp: 20 20  18   Temp: (!) 96.2 F (35.7 C) (!) 97.4 F (36.3 C)  97.8 F (36.6 C)  TempSrc: Axillary Oral  Oral  SpO2: 100% 100% 100% 96%  Weight:      Height:        Wt Readings from Last 3 Encounters:  01/03/17 251 lb 9.6 oz (114.1 kg)  11/06/16 257 lb (116.6 kg)  10/16/16 257 lb 3.2 oz (116.7 kg)      Intake/Output Summary (Last 24 hours) at 01/03/2017 1355 Last data filed at 01/03/2017 1100 Gross per 24 hour  Intake 240 ml  Output -  Net 240 ml    Physical Exam:   GENERAL: Pleasant-appearing in no apparent distress.  HEAD, EYES, EARS, NOSE AND THROAT: Atraumatic, normocephalic. Extraocular muscles are intact. Pupils equal and reactive to light. Sclerae anicteric. No conjunctival injection. No oro-pharyngeal erythema.  NECK: Supple. There is no jugular venous distention. No bruits, no lymphadenopathy, no thyromegaly.  HEART: Regular rate and rhythm,. No murmurs, no rubs, no clicks.  LUNGS:  Bilateral wheezing throughout both lungs ABDOMEN: Soft, flat, nontender, nondistended. Has good bowel sounds. No hepatosplenomegaly appreciated.  EXTREMITIES: No evidence of any cyanosis, clubbing, or peripheral edema.  +2 pedal and radial pulses bilaterally.  NEUROLOGIC: The patient is alert, awake, and oriented x3 with no focal motor or sensory deficits appreciated bilaterally.  SKIN: Moist and warm with no rashes appreciated.  Psych: Not anxious, depressed LN: No inguinal LN enlargement    Antibiotics   Anti-infectives (From admission, onward)   Start     Dose/Rate Route Frequency Ordered Stop   01/03/17 1000  azithromycin (ZITHROMAX) tablet 500 mg     500 mg Oral Daily 01/03/17 0204        Medications   Scheduled Meds: . albuterol  5 mg Nebulization Once  . atorvastatin  40 mg Oral QHS  . azithromycin  500 mg Oral Daily  . divalproex  250 mg Oral BID  . enoxaparin (LOVENOX) injection  40 mg Subcutaneous Q12H  . escitalopram  20 mg Oral Daily  . lurasidone  40 mg Oral QPM  . methylPREDNISolone (SOLU-MEDROL) injection  60 mg Intravenous Q6H  . QUEtiapine  300 mg Oral QHS  . tiotropium  18 mcg Inhalation Daily   Continuous Infusions: PRN Meds:.acetaminophen **OR** acetaminophen, ALPRAZolam, guaiFENesin-dextromethorphan, ipratropium-albuterol, morphine injection, ondansetron  **OR** ondansetron (ZOFRAN) IV, oxyCODONE-acetaminophen   Data Review:   Micro Results No results found for this or any previous visit (from the past 240 hour(s)).  Radiology Reports Dg Chest Portable 1 View  Result Date: 01/02/2017 CLINICAL DATA:  Dyspnea and wheezing today.  History of COPD. EXAM: PORTABLE CHEST 1 VIEW COMPARISON:  11/06/2016 FINDINGS: Mildly enlarged but stable cardiopericardial silhouette. No aortic aneurysm. Both lungs are clear. The visualized skeletal structures are unremarkable. IMPRESSION: No active disease. Electronically Signed   By: Ashley Royalty M.D.   On: 01/02/2017 21:58     CBC Recent Labs  Lab 01/02/17 2141 01/03/17 0549  WBC 7.2 4.5  HGB 13.8 13.2  HCT 41.2 38.9  PLT 190 175  MCV 92.0 92.2  MCH 30.7 31.3  MCHC 33.4 34.0  RDW 13.5 13.6    Chemistries  Recent Labs  Lab 01/02/17 2141 01/03/17 0549  NA 139 138  K 3.2* 3.8  CL 98* 96*  CO2 30 30  GLUCOSE 112* 226*  BUN 12 12  CREATININE 1.10* 1.06*  CALCIUM 8.5* 8.5*   ------------------------------------------------------------------------------------------------------------------ estimated creatinine clearance is 72.6 mL/min (A) (by C-G formula based on SCr of 1.06 mg/dL (H)). ------------------------------------------------------------------------------------------------------------------ No results for input(s): HGBA1C in the last 72 hours. ------------------------------------------------------------------------------------------------------------------ No results for input(s): CHOL, HDL, LDLCALC, TRIG, CHOLHDL, LDLDIRECT in the last 72 hours. ------------------------------------------------------------------------------------------------------------------ No results for input(s): TSH, T4TOTAL, T3FREE, THYROIDAB in the last 72 hours.  Invalid input(s): FREET3 ------------------------------------------------------------------------------------------------------------------ No  results for input(s): VITAMINB12, FOLATE, FERRITIN, TIBC, IRON, RETICCTPCT in the last 72 hours.  Coagulation profile No results for input(s): INR, PROTIME in the last 168 hours.  No results for input(s): DDIMER in the last 72 hours.  Cardiac Enzymes Recent Labs  Lab 01/02/17 2141  TROPONINI <0.03   ------------------------------------------------------------------------------------------------------------------ Invalid input(s): POCBNP    Assessment & Plan  Patient is a 56 year old with COPD with worsening shortness of breath  1.  Acute on chronic COPD exacerbation  Continue therapy with nebulizer Continue IV Solu-Medrol Add Pulmicort to current regimen Mucinex Continue with azithromycin  2.  Hypokalemia replaced  3.  Hyperlipidemia unspecified continue Lipitor  4.  Anxiety depressioncontinue her home regimen  5.  Bipolar disorder continue home regimen  6.  GERD continue H2 blockers  7. Nicotine abuse smoking cessation provided 61min recommend she stop, nicotine patch will be started      Code Status Orders  (From admission, onward)        Start     Ordered   01/03/17 0205  Full code  Continuous     01/03/17 0204    Code Status History    Date Active Date Inactive Code Status Order ID Comments User Context   02/27/2016 20:49 03/01/2016 17:18 Full Code [id number]  Hillary Bow, MD ED   05/13/2015 04:03 05/14/2015 18:08 Full Code [id number]  Lance Coon, MD ED   03/25/2015 21:18 03/28/2015 20:48 Full Code [id number]  Lytle Butte, MD ED   03/02/2015 01:28 03/08/2015 13:49 Full Code [id number]  Lytle Butte, MD ED   07/02/2014 15:15 07/03/2014 17:27 Full Code [id number]  Loletha Grayer, MD ED           Consults none   DVT Prophylaxis  Lovenox  Lab Results  Component Value Date   PLT 175 01/03/2017     Time Spent in minutes 28min Greater than 50% of time spent in care coordination and counseling patient regarding the condition and plan of  care.   Dustin Flock M.D on 01/03/2017 at 1:55 PM  Between 7am to 6pm - Pager - [phone]  After 6pm go to [email or web address] - password [email or web address] [facility]   Office  [phone]

## 2017-01-03 NOTE — Progress Notes (Signed)
Inpatient Diabetes Program Recommendations  AACE/ADA: New Consensus Statement on Inpatient Glycemic Control (2015)  Target Ranges:  Prepandial:   less than 140 mg/dL      Peak postprandial:   less than 180 mg/dL (1-2 hours)      Critically ill patients:  140 - 180 mg/dL   Results for MAYO, OWCZARZAK (MRN [id number]) as of 01/03/2017 15:43  Ref. Range 01/02/2017 21:41 01/03/2017 05:49  Glucose Latest Ref Range: 65 - 99 mg/dL 112 (H) 226 (H)   Review of Glycemic Control  Diabetes history: None last A1c 5.5% on 10/16/16 Current orders for Inpatient glycemic control: None  Inpatient Diabetes Program Recommendations:    Lab glucose 226 this am. Patient receiving IV Solumedrol 60 mg Q6 hours. Please consider CBGs and if elevated Novolog Sensitive Correction 0-9 units tid + Novolog HS scale 0-5 units.  Thanks,  Tama Headings RN, MSN, [facility] Inpatient Diabetes Coordinator Team Pager [phone] (8a-5p)

## 2017-01-03 NOTE — Progress Notes (Signed)
Admission complete. Pnt resting no issues or concerns at this time. Will continue to monitor and assess.

## 2017-01-03 NOTE — Progress Notes (Signed)
Medications administered by student RN [id number] with supervision of Clinical Instructor Elray Dains MSN, RN-BC or patient's assigned RN.   

## 2017-01-04 MED ORDER — PHENOL 1.4 % MT LIQD
1.0000 | OROMUCOSAL | Status: DC | PRN
  Administered 2017-01-04 – 2017-01-05 (×3): 1 via OROMUCOSAL
  Filled 2017-01-04: qty 177

## 2017-01-04 MED ORDER — GUAIFENESIN-CODEINE 100-10 MG/5ML PO SOLN: 10.0000 mL | ORAL | Status: DC | PRN

## 2017-01-04 NOTE — Care Management Note (Signed)
Case Management Note  Patient Details  Name: Mattisyn Cardona MRN: [id number] Date of Birth: Mar 23, 1960  Subjective/Objective: Admitted to [facility] with the diagnosis of COPD. Lives with sister Haynes Dage [phone]). Last seen Dr. Sanda Klein in September. Prescriptions are filled at [facility] and [facility]. [facility] in the past. [facility] 3 years ago. [facility] and [facility] services in the past. States she did qualify for home oxygen per Dr Sanda Klein and had home oxygen, but doesn't anymore. Issued a home nebulizer last January, but lost it when moving,  [place] and 2 canes in the home. Takes care of all basic activities of daily living herself, doesn't drive, Sister helps with errands.Fell last week. Good appetite Sister will transport                   Action/Plan: Will continue to follow for discharge plans   Expected Discharge Date:                  Expected Discharge Plan:     In-House Referral:     Discharge planning Services     Post Acute Care Choice:    Choice offered to:     DME Arranged:    DME Agency:     HH Arranged:    HH Agency:     Status of Service:     If discussed at [employer], dates discussed:    Additional Comments:  Shelbie Ammons, RN MSN CCM Care Management [phone] 01/04/2017, 9:06 AM

## 2017-01-04 NOTE — Care Management Important Message (Signed)
Important Message  Patient Details  Name: Nicole Austin MRN: [id number] Date of Birth: December 09, 1960   Medicare Important Message Given:  Yes    Shelbie Ammons, RN 01/04/2017, 8:22 AM

## 2017-01-04 NOTE — Progress Notes (Signed)
[place] at [facility]                                                                                                                                                                                  Patient Demographics   Nicole Austin, is a 56 y.o. female, DOB - 05-12-1960, WPY:[id number]  Admit date - 01/02/2017   Admitting Physician Lance Coon, MD  Outpatient Primary MD for the patient is Lada, Satira Anis, MD   LOS - 2  Subjective: Patient's breathing is improved compared to admission but still complaining of cough  Review of Systems:   CONSTITUTIONAL: No documented fever. No fatigue, weakness. No weight gain, no weight loss.  EYES: No blurry or double vision.  ENT: No tinnitus. No postnasal drip. No redness of the oropharynx.  RESPIRATORY: Positive cough, positive wheeze, no hemoptysis. No dyspnea.  CARDIOVASCULAR: No chest pain. No orthopnea. No palpitations. No syncope.  GASTROINTESTINAL: No nausea, no vomiting or diarrhea. No abdominal pain. No melena or hematochezia.  GENITOURINARY: No dysuria or hematuria.  ENDOCRINE: No polyuria or nocturia. No heat or cold intolerance.  HEMATOLOGY: No anemia. No bruising. No bleeding.  INTEGUMENTARY: No rashes. No lesions.  MUSCULOSKELETAL: No arthritis. No swelling. No gout.  NEUROLOGIC: No numbness, tingling, or ataxia. No seizure-type activity.  PSYCHIATRIC: No anxiety. No insomnia. No ADD.    Vitals:   Vitals:   01/04/17 0743 01/04/17 0810 01/04/17 0930 01/04/17 1159  BP:  (!) 110/50  125/61  Pulse:  82  75  Resp:    20  Temp:    97.8 F (36.6 C)  TempSrc:    Oral  SpO2: 92% (!) 89% (!) 89% 91%  Weight:      Height:        Wt Readings from Last 3 Encounters:  01/03/17 251 lb 9.6 oz (114.1 kg)  11/06/16 257 lb (116.6 kg)  10/16/16 257 lb 3.2 oz (116.7 kg)    No intake or output data in the 24 hours ending 01/04/17 1322  Physical Exam:   GENERAL: Pleasant-appearing in no apparent  distress.  HEAD, EYES, EARS, NOSE AND THROAT: Atraumatic, normocephalic. Extraocular muscles are intact. Pupils equal and reactive to light. Sclerae anicteric. No conjunctival injection. No oro-pharyngeal erythema.  NECK: Supple. There is no jugular venous distention. No bruits, no lymphadenopathy, no thyromegaly.  HEART: Regular rate and rhythm,. No murmurs, no rubs, no clicks.  LUNGS: Bilateral wheezing throughout both lungs ABDOMEN: Soft, flat, nontender, nondistended. Has good bowel sounds. No hepatosplenomegaly appreciated.  EXTREMITIES: No evidence of any cyanosis, clubbing, or peripheral edema.  +2 pedal and radial pulses bilaterally.  NEUROLOGIC: The patient is alert,  awake, and oriented x3 with no focal motor or sensory deficits appreciated bilaterally.  SKIN: Moist and warm with no rashes appreciated.  Psych: Not anxious, depressed LN: No inguinal LN enlargement    Antibiotics   Anti-infectives (From admission, onward)   Start     Dose/Rate Route Frequency Ordered Stop   01/03/17 1000  azithromycin (ZITHROMAX) tablet 500 mg     500 mg Oral Daily 01/03/17 0204 01/07/17 2359      Medications   Scheduled Meds: . albuterol  5 mg Nebulization Once  . atorvastatin  40 mg Oral QHS  . azithromycin  500 mg Oral Daily  . budesonide (PULMICORT) nebulizer solution  0.25 mg Nebulization BID  . divalproex  250 mg Oral BID  . enoxaparin (LOVENOX) injection  40 mg Subcutaneous Q12H  . escitalopram  20 mg Oral Daily  . guaiFENesin  600 mg Oral BID  . ipratropium-albuterol  3 mL Nebulization Q6H  . lurasidone  40 mg Oral QPM  . methylPREDNISolone (SOLU-MEDROL) injection  60 mg Intravenous Q6H  . nicotine  21 mg Transdermal Daily  . QUEtiapine  300 mg Oral QHS  . tiotropium  18 mcg Inhalation Daily   Continuous Infusions: PRN Meds:.acetaminophen **OR** acetaminophen, ALPRAZolam, guaiFENesin-codeine, morphine injection, ondansetron **OR** ondansetron (ZOFRAN) IV,  oxyCODONE-acetaminophen, phenol   Data Review:   Micro Results No results found for this or any previous visit (from the past 240 hour(s)).  Radiology Reports Dg Chest Portable 1 View  Result Date: 01/02/2017 CLINICAL DATA:  Dyspnea and wheezing today.  History of COPD. EXAM: PORTABLE CHEST 1 VIEW COMPARISON:  11/06/2016 FINDINGS: Mildly enlarged but stable cardiopericardial silhouette. No aortic aneurysm. Both lungs are clear. The visualized skeletal structures are unremarkable. IMPRESSION: No active disease. Electronically Signed   By: Ashley Royalty M.D.   On: 01/02/2017 21:58     CBC Recent Labs  Lab 01/02/17 2141 01/03/17 0549  WBC 7.2 4.5  HGB 13.8 13.2  HCT 41.2 38.9  PLT 190 175  MCV 92.0 92.2  MCH 30.7 31.3  MCHC 33.4 34.0  RDW 13.5 13.6    Chemistries  Recent Labs  Lab 01/02/17 2141 01/03/17 0549  NA 139 138  K 3.2* 3.8  CL 98* 96*  CO2 30 30  GLUCOSE 112* 226*  BUN 12 12  CREATININE 1.10* 1.06*  CALCIUM 8.5* 8.5*   ------------------------------------------------------------------------------------------------------------------ estimated creatinine clearance is 72.6 mL/min (A) (by C-G formula based on SCr of 1.06 mg/dL (H)). ------------------------------------------------------------------------------------------------------------------ No results for input(s): HGBA1C in the last 72 hours. ------------------------------------------------------------------------------------------------------------------ No results for input(s): CHOL, HDL, LDLCALC, TRIG, CHOLHDL, LDLDIRECT in the last 72 hours. ------------------------------------------------------------------------------------------------------------------ No results for input(s): TSH, T4TOTAL, T3FREE, THYROIDAB in the last 72 hours.  Invalid input(s): FREET3 ------------------------------------------------------------------------------------------------------------------ No results for input(s):  VITAMINB12, FOLATE, FERRITIN, TIBC, IRON, RETICCTPCT in the last 72 hours.  Coagulation profile No results for input(s): INR, PROTIME in the last 168 hours.  No results for input(s): DDIMER in the last 72 hours.  Cardiac Enzymes Recent Labs  Lab 01/02/17 2141  TROPONINI <0.03   ------------------------------------------------------------------------------------------------------------------ Invalid input(s): POCBNP    Assessment & Plan  Patient is a 56 year old with COPD with worsening shortness of breath  1.  Acute on chronic COPD exacerbation  Continue therapy with nebulizer Continue IV Solu-Medrol Add Pulmicort to current regimen Mucinex Continue with azithromycin  2.  Hypokalemia replaced  3.  Hyperlipidemia unspecified continue Lipitor  4.  Anxiety depressioncontinue her home regimen  5.  Bipolar disorder continue home  regimen  6.  GERD continue H2 blockers  7. Nicotine abuse smoking cessation provided 67min recommend she stop, nicotine patch will be started      Code Status Orders  (From admission, onward)        Start     Ordered   01/03/17 0205  Full code  Continuous     01/03/17 0204    Code Status History    Date Active Date Inactive Code Status Order ID Comments User Context   02/27/2016 20:49 03/01/2016 17:18 Full Code [id number]  Hillary Bow, MD ED   05/13/2015 04:03 05/14/2015 18:08 Full Code [id number]  Lance Coon, MD ED   03/25/2015 21:18 03/28/2015 20:48 Full Code [id number]  Lytle Butte, MD ED   03/02/2015 01:28 03/08/2015 13:49 Full Code [id number]  Lytle Butte, MD ED   07/02/2014 15:15 07/03/2014 17:27 Full Code [id number]  Loletha Grayer, MD ED           Consults none   DVT Prophylaxis  Lovenox  Lab Results  Component Value Date   PLT 175 01/03/2017     Time Spent in minutes 65min Greater than 50% of time spent in care coordination and counseling patient regarding the condition and plan of care.   Dustin Flock M.D on  01/04/2017 at 1:22 PM  Between 7am to 6pm - Pager - [phone]  After 6pm go to [email or web address] - password [email or web address] [facility]   Office  [phone]

## 2017-01-05 ENCOUNTER — Other Ambulatory Visit: Payer: Self-pay | Admitting: Psychiatry

## 2017-01-05 ENCOUNTER — Other Ambulatory Visit: Payer: Self-pay | Admitting: Family Medicine

## 2017-01-05 MED ORDER — ALBUTEROL SULFATE (2.5 MG/3ML) 0.083% IN NEBU: 2.5000 mg | INHALATION_SOLUTION | RESPIRATORY_TRACT | 0 refills | Status: DC | PRN

## 2017-01-05 MED ORDER — PREDNISONE 10 MG (21) PO TBPK: 10.0000 mg | ORAL_TABLET | Freq: Every day | ORAL | 0 refills | Status: DC

## 2017-01-05 MED ORDER — IPRATROPIUM-ALBUTEROL 0.5-2.5 (3) MG/3ML IN SOLN: 3.0000 mL | Freq: Four times a day (QID) | RESPIRATORY_TRACT | 0 refills | Status: DC

## 2017-01-05 MED ORDER — NICOTINE 21 MG/24HR TD PT24: 21.0000 mg | MEDICATED_PATCH | Freq: Every day | TRANSDERMAL | 0 refills | Status: DC

## 2017-01-05 MED ORDER — AZITHROMYCIN 500 MG PO TABS: 500.0000 mg | ORAL_TABLET | Freq: Every day | ORAL | 0 refills | Status: DC

## 2017-01-05 MED ORDER — METHYLPREDNISOLONE SODIUM SUCC 125 MG IJ SOLR: 60.0000 mg | Freq: Two times a day (BID) | INTRAMUSCULAR | Status: DC

## 2017-01-05 MED ORDER — GUAIFENESIN ER 600 MG PO TB12: 600.0000 mg | ORAL_TABLET | Freq: Two times a day (BID) | ORAL | Status: DC

## 2017-01-05 NOTE — Discharge Summary (Signed)
[place] [facility] [place] NAME: Nicole Austin    MR#:  [id number]  DATE OF BIRTH:  1960/11/18  DATE OF ADMISSION:  01/02/2017 ADMITTING PHYSICIAN: Lance Coon, MD  DATE OF DISCHARGE:  01/05/17  PRIMARY CARE PHYSICIAN: Arnetha Courser, MD    ADMISSION DIAGNOSIS:  Hypoxia [R09.02] COPD exacerbation ([place]) [J44.1]  DISCHARGE DIAGNOSIS:  Principal Problem:   COPD exacerbation ([place]) Active Problems:   HLD (hyperlipidemia)   Bipolar affective disorder, current episode depressed ([place])   Apnea, sleep   GERD (gastroesophageal reflux disease)   SECONDARY DIAGNOSIS:   Past Medical History:  Diagnosis Date  . ADHD (attention deficit hyperactivity disorder)   . Arthritis   . Asthma   . Bipolar 1 disorder ([place])   . COPD (chronic obstructive pulmonary disease) ([place])   . Depression   . GERD (gastroesophageal reflux disease) 08/30/2015  . History of acute myocardial infarction 06/30/2015   Overview:  [facility]   . Low HDL (under 40) 08/30/2015  . MI (myocardial infarction) ([place])   . Prediabetes 05/20/2016  . PTSD (post-traumatic stress disorder)   . Rhabdomyolysis   . Stroke [facility])     HOSPITAL COURSE:  HPI  Nicole Austin  is a 56 y.o. female who presents with 2 days of progressive shortness of breath and wheezing.  Patient has known history of COPD and has been hospitalized for the same in the past.  She states that several months ago her nebulizer machine stopped working, so she was not able to use any nebulizer treatments at home.  Initial workup here did not indicate infection, she was treated for COPD exacerbation, and hospitalists were called for admission   1.  Acute on chronic COPD exacerbation  Continue therapy with nebulizer If clinically improved with IV Solu-Medrol discharge with tapering steroids Patient will be discharged with a nebulizer machine and neb treatments  meets criteria for 2 L of home oxygen, will arrange home O2 Add  Pulmicort to current regimen Mucinex Continue with azithromycin  2.  Hypokalemia replaced  3.  Hyperlipidemia unspecified continue Lipitor  4.  Anxiety depressioncontinue her home regimen  5.  Bipolar disorder continue home regimen  6.  GERD continue H2 blockers  7. Nicotine abuse smoking cessation provided 12mn recommend she stop, nicotine patch started      DISCHARGE CONDITIONS:   stable  CONSULTS OBTAINED:     PROCEDURES  NONE   DRUG ALLERGIES:   Allergies  Allergen Reactions  . Chantix [Varenicline] Nausea Only    DISCHARGE MEDICATIONS:   Current Discharge Medication List    START taking these medications   Details  azithromycin (ZITHROMAX) 500 MG tablet Take 1 tablet (500 mg total) daily by mouth. Qty: 2 tablet, Refills: 0    guaiFENesin (MUCINEX) 600 MG 12 hr tablet Take 1 tablet (600 mg total) 2 (two) times daily by mouth.    ipratropium-albuterol (DUONEB) 0.5-2.5 (3) MG/3ML SOLN Take 3 mLs every 6 (six) hours by nebulization. Qty: 360 mL, Refills: 0    nicotine (NICODERM CQ - DOSED IN MG/24 HOURS) 21 mg/24hr patch Place 1 patch (21 mg total) daily onto the skin. Qty: 28 patch, Refills: 0    predniSONE (STERAPRED UNI-PAK 21 TAB) 10 MG (21) TBPK tablet Take 1 tablet (10 mg total) daily by mouth. Take 6 tablets by mouth for 1 day followed by  5 tablets by mouth for 1 day followed by  4 tablets by mouth for 1 day  followed by  3 tablets by mouth for 1 day followed by  2 tablets by mouth for 1 day followed by  1 tablet by mouth for a day and stop Qty: 21 tablet, Refills: 0      CONTINUE these medications which have CHANGED   Details  albuterol (PROVENTIL) (2.5 MG/3ML) 0.083% nebulizer solution Take 3 mLs (2.5 mg total) every 4 (four) hours as needed by nebulization for wheezing or shortness of breath. Qty: 75 mL, Refills: 0      CONTINUE these medications which have NOT CHANGED   Details  acetaminophen (TYLENOL) 500 MG tablet Take 500 mg  by mouth every 6 (six) hours as needed.    albuterol (PROVENTIL HFA;VENTOLIN HFA) 108 (90 Base) MCG/ACT inhaler Inhale 2 puffs into the lungs every 6 (six) hours as needed for wheezing or shortness of breath. Qty: 1 Inhaler, Refills: 0    ALPRAZolam (XANAX) 0.5 MG tablet Take 0.5 mg 3 (three) times daily as needed by mouth for anxiety or sleep.    atorvastatin (LIPITOR) 40 MG tablet TAKE 1 TABLET BY MOUTH AT BEDTIME Qty: 30 tablet, Refills: 3    divalproex (DEPAKOTE) 250 MG DR tablet Take 1 tablet (250 mg total) by mouth 2 (two) times daily. Qty: 60 tablet, Refills: 2    escitalopram (LEXAPRO) 20 MG tablet Take 1 tablet (20 mg total) by mouth daily. Qty: 30 tablet, Refills: 2    furosemide (LASIX) 20 MG tablet One by mouth every three days for swelling Qty: 30 tablet, Refills: 2    lurasidone (LATUDA) 40 MG TABS tablet Take 40 mg every evening by mouth.    Multiple Vitamin (MULTIVITAMIN WITH MINERALS) TABS tablet Take 1 tablet by mouth daily.    potassium chloride (KLOR-CON 10) 10 MEQ tablet One by mouth every three days Qty: 10 tablet, Refills: 2    ranitidine (ZANTAC) 150 MG tablet Take 1 tablet (150 mg total) by mouth 2 (two) times daily. Qty: 60 tablet, Refills: 2    SPIRIVA RESPIMAT 2.5 MCG/ACT AERS INHALE 2 PUFFS INTO LUNGS DAILY Qty: 4 g, Refills: 6    aspirin-acetaminophen-caffeine (EXCEDRIN MIGRAINE) 250-250-65 MG tablet Take by mouth every 6 (six) hours as needed for headache.    QUEtiapine (SEROQUEL XR) 300 MG 24 hr tablet Take 1 tablet (300 mg total) by mouth at bedtime. Qty: 30 tablet, Refills: 2    Respiratory Therapy Supplies (NEBULIZER COMPRESSOR) KIT One nebulizer kit/machine with tubing; for use with nebulized albuterol Qty: 1 each, Refills: 0      STOP taking these medications     traMADol (ULTRAM) 50 MG tablet          DISCHARGE INSTRUCTIONS:  Continue 2 L of oxygen via nasal cannula Follow-up with primary care physician in 1 week Quit  smoking   DIET:  Cardiac diet  DISCHARGE CONDITION:  Fair  ACTIVITY:  Activity as tolerated  OXYGEN:  Home Oxygen: Yes.     Oxygen Delivery: 2 liters/min via Patient connected to nasal cannula oxygen  DISCHARGE LOCATION:  home   If you experience worsening of your admission symptoms, develop shortness of breath, life threatening emergency, suicidal or homicidal thoughts you must seek medical attention immediately by calling 911 or calling your MD immediately  if symptoms less severe.  You Must read complete instructions/literature along with all the possible adverse reactions/side effects for all the Medicines you take and that have been prescribed to you. Take any new Medicines after you have completely understood and  accpet all the possible adverse reactions/side effects.   Please note  You were cared for by a hospitalist during your hospital stay. If you have any questions about your discharge medications or the care you received while you were in the hospital after you are discharged, you can call the unit and asked to speak with the hospitalist on call if the hospitalist that took care of you is not available. Once you are discharged, your primary care physician will handle any further medical issues. Please note that NO REFILLS for any discharge medications will be authorized once you are discharged, as it is imperative that you return to your primary care physician (or establish a relationship with a primary care physician if you do not have one) for your aftercare needs so that they can reassess your need for medications and monitor your lab values.     Today  Chief Complaint  Patient presents with  . COPD  . Shortness of Breath   Shortness of breath significantly improved.  Denies wheezing.  Wants to go home.  Ambulating in the hallway, desaturated requiring 2 L of oxygen, decided to quit smoking  ROS:  CONSTITUTIONAL: Denies fevers, chills. Denies any fatigue,  weakness.  EYES: Denies blurry vision, double vision, eye pain. EARS, NOSE, THROAT: Denies tinnitus, ear pain, hearing loss. RESPIRATORY: Denies cough, wheeze, shortness of breath.  CARDIOVASCULAR: Denies chest pain, palpitations, edema.  GASTROINTESTINAL: Denies nausea, vomiting, diarrhea, abdominal pain. Denies bright red blood per rectum. GENITOURINARY: Denies dysuria, hematuria. ENDOCRINE: Denies nocturia or thyroid problems. HEMATOLOGIC AND LYMPHATIC: Denies easy bruising or bleeding. SKIN: Denies rash or lesion. MUSCULOSKELETAL: Denies pain in neck, back, shoulder, knees, hips or arthritic symptoms.  NEUROLOGIC: Denies paralysis, paresthesias.  PSYCHIATRIC: Denies anxiety or depressive symptoms.   VITAL SIGNS:  Blood pressure (!) 106/45, pulse 73, temperature 97.7 F (36.5 C), temperature source Oral, resp. rate 20, height _0  (1.626 m), weight 114.1 kg (251 lb 9.6 oz), SpO2 93 %.  I/O:    Intake/Output Summary (Last 24 hours) at 01/05/2017 1413 Last data filed at 01/05/2017 1349 Gross per 24 hour  Intake 720 ml  Output -  Net 720 ml    PHYSICAL EXAMINATION:  GENERAL:  56 y.o.-year-old patient lying in the bed with no acute distress.  EYES: Pupils equal, round, reactive to light and accommodation. No scleral icterus. Extraocular muscles intact.  HEENT: Head atraumatic, normocephalic. Oropharynx and nasopharynx clear.  NECK:  Supple, no jugular venous distention. No thyroid enlargement, no tenderness.  LUNGS: Normal breath sounds bilaterally, no wheezing, rales,rhonchi or crepitation. No use of accessory muscles of respiration.  CARDIOVASCULAR: S1, S2 normal. No murmurs, rubs, or gallops.  ABDOMEN: Soft, non-tender, non-distended. Bowel sounds present. No organomegaly or mass.  EXTREMITIES: No pedal edema, cyanosis, or clubbing.  NEUROLOGIC: Cranial nerves II through XII are intact. Muscle strength 5/5 in all extremities. Sensation intact. Gait not checked.   PSYCHIATRIC: The patient is alert and oriented x 3.  SKIN: No obvious rash, lesion, or ulcer.   DATA REVIEW:   CBC Recent Labs  Lab 01/03/17 0549  WBC 4.5  HGB 13.2  HCT 38.9  PLT 175    Chemistries  Recent Labs  Lab 01/03/17 0549  NA 138  K 3.8  CL 96*  CO2 30  GLUCOSE 226*  BUN 12  CREATININE 1.06*  CALCIUM 8.5*    Cardiac Enzymes Recent Labs  Lab 01/02/17 2141  TROPONINI <0.03    Microbiology Results  Results for orders  placed or performed during the hospital encounter of 05/13/15  Culture, blood (routine x 2)     Status: None   Collection Time: 05/13/15  5:45 AM  Result Value Ref Range Status   Specimen Description BLOOD LEFT ANTECUBITAL  Final   Special Requests BOTTLES DRAWN AEROBIC AND ANAEROBIC 5ML  Final   Culture NO GROWTH 5 DAYS  Final   Report Status 05/18/2015 FINAL  Final  Culture, blood (routine x 2)     Status: None   Collection Time: 05/13/15  5:53 AM  Result Value Ref Range Status   Specimen Description BLOOD RIGHT ANTECUBITAL  Final   Special Requests BOTTLES DRAWN AEROBIC AND ANAEROBIC 5ML  Final   Culture NO GROWTH 5 DAYS  Final   Report Status 05/18/2015 FINAL  Final  MRSA PCR Screening     Status: None   Collection Time: 05/13/15  6:21 AM  Result Value Ref Range Status   MRSA by PCR NEGATIVE NEGATIVE Final    Comment:        The GeneXpert MRSA Assay (FDA approved for NASAL specimens only), is one component of a comprehensive MRSA colonization surveillance program. It is not intended to diagnose MRSA infection nor to guide or monitor treatment for MRSA infections.     RADIOLOGY:  Dg Chest Portable 1 View  Result Date: 01/02/2017 CLINICAL DATA:  Dyspnea and wheezing today.  History of COPD. EXAM: PORTABLE CHEST 1 VIEW COMPARISON:  11/06/2016 FINDINGS: Mildly enlarged but stable cardiopericardial silhouette. No aortic aneurysm. Both lungs are clear. The visualized skeletal structures are unremarkable. IMPRESSION: No active  disease. Electronically Signed   By: Ashley Royalty M.D.   On: 01/02/2017 21:58    EKG:   Orders placed or performed during the hospital encounter of 01/02/17  . ED EKG  . ED EKG      Management plans discussed with the patient, family and they are in agreement.  CODE STATUS:     Code Status Orders  (From admission, onward)        Start     Ordered   01/03/17 0205  Full code  Continuous     01/03/17 0204    Code Status History    Date Active Date Inactive Code Status Order ID Comments User Context   02/27/2016 20:49 03/01/2016 17:18 Full Code [id number]  Hillary Bow, MD ED   05/13/2015 04:03 05/14/2015 18:08 Full Code [id number]  Lance Coon, MD ED   03/25/2015 21:18 03/28/2015 20:48 Full Code [id number]  Lytle Butte, MD ED   03/02/2015 01:28 03/08/2015 13:49 Full Code [id number]  Lytle Butte, MD ED   07/02/2014 15:15 07/03/2014 17:27 Full Code [id number]  Loletha Grayer, MD ED      TOTAL TIME TAKING CARE OF THIS PATIENT: 45  minutes.   Note: This dictation was prepared with Dragon dictation along with smaller phrase technology. Any transcriptional errors that result from this process are unintentional.   _0 @  on 01/05/2017 at 2:13 PM  Between 7am to 6pm - Pager - [phone]  After 6pm go to [email or web address] - password [email or web address] [facility]  Office  [phone]  CC: Primary care physician; Arnetha Courser, MD

## 2017-01-05 NOTE — Progress Notes (Signed)
Pt being discharged home, discharge instructions and prescriptions reviewed with pt, states understanding, pt with no complaints, no distress or discomfort noted, waiting for sister for transport

## 2017-01-05 NOTE — Discharge Instructions (Signed)
Continue 2 L of oxygen via nasal cannula Follow-up with primary care physician in 1 week Quit smoking

## 2017-01-05 NOTE — Progress Notes (Signed)
SATURATION QUALIFICATIONS: (This note is used to comply with regulatory documentation for home oxygen)  Patient Saturations on Room Air at Rest = 92%  Patient Saturations on Room Air while Ambulating = 86%  Patient Saturations on 2Liters of oxygen while Ambulating = 90%

## 2017-01-05 NOTE — Care Management Note (Addendum)
Case Management Note  Patient Details  Name: Nicole Austin MRN: [id number] Date of Birth: 05/17/1960  Subjective/Objective:  [name] qualifies for new home 02. An order for new 02 and a new nebulizer machine was called to Melene Muller at [facility]. [name] has chronic COPD and her 02 sats drop to below 88% upon exertion on room air. [name] has used Spriva, Ventolin HFA, and PO lasix which has not controlled her symptoms. She will now be using Albuterol 2.5mg /48ml nebulizer solution along with new continuous home oxygen.                   Action/Plan:   Expected Discharge Date:  01/05/17               Expected Discharge Plan:  Home/Self Care  In-House Referral:     Discharge planning Services  CM Consult  Post Acute Care Choice:  Durable Medical Equipment Choice offered to:  Patient  DME Arranged:  Oxygen DME Agency:  [place]:  NA Center Agency:  NA  Status of Service:  Completed, signed off  If discussed at [place] of Stay Meetings, dates discussed:    Additional Comments:  Lige Lakeman A, RN 01/05/2017, 2:07 PM

## 2017-01-07 ENCOUNTER — Telehealth: Payer: Self-pay

## 2017-01-07 NOTE — Telephone Encounter (Signed)
Called pt to follow up after discharge from the hospital. Also wanted to confirm her follow up appt with Dr. Sanda Klein on 01/10/17 @ 10:20am. LVM requesting returned call.

## 2017-01-08 ENCOUNTER — Telehealth: Payer: Self-pay

## 2017-01-08 NOTE — Telephone Encounter (Signed)
Transition Care Management Follow-Up Telephone Call   Date discharged and where: 01/05/17 from [facility]  How  have you been since you were released from the hospital?   Symptoms are improving since she started on Prednisone and ATB's. Also states neb txt's are helping to relieve her symptoms as well.   Any patient concerns?   Unable to afford Nicoderm and Mucinex at the time of discharge. Since her discharge, [facility] has agreed to offer financial assistance with these medications.   Pt also states she is homeless but temporarily living with her sister until she can find a place of her own. Pt has reached out to [employer] for financial assistance with food, housing and medical. Provided pt with contact number for [facility] CM and Social Services. Advised she is welcome to reach out to them to determine if she may qualify for any additional services.  Items Reviewed:   Meds: Medications reviewed and has started all newly prescribed medications EXCEPT Mucinex and Nicoderm. Again, [facility] has agreed to offer financial assistance for these medications.  Allergies: Pt denies having developed any new drug allergies since discharge   Dietary Changes Reviewed: Denies any changes to her diet at this time  Functional Questionnaire:  Independent-I Dependent-D  ADLs:   Dressing- I    Eating- I   Maintaining continence- I   Transferring- I   Transportation- I   Meal Prep- I   Managing Meds- I  Confirmed importance and Date/Time of follow-up visits scheduled: Confirmed follow up appt with Dr. Sanda Klein on 01/10/17 @ 10:20am. Sister has agreed to transport pt to this appt.   Confirmed with patient if condition worsens to call PCP or go to the Emergency Dept. Patient was given office number and encouraged to call back with questions or concerns: Verbalized acceptance and understanding.

## 2017-01-10 ENCOUNTER — Emergency Department
Admission: EM | Admit: 2017-01-10 | Discharge: 2017-01-10 | Disposition: A | Discharge status: Home / Self Care | Payer: Medicare Other | Attending: Emergency Medicine | Admitting: Emergency Medicine

## 2017-01-10 ENCOUNTER — Emergency Department: Payer: Medicare Other

## 2017-01-10 ENCOUNTER — Ambulatory Visit (INDEPENDENT_AMBULATORY_CARE_PROVIDER_SITE_OTHER): Payer: Medicare Other | Admitting: Family Medicine

## 2017-01-10 ENCOUNTER — Encounter: Payer: Self-pay | Admitting: Family Medicine

## 2017-01-10 VITALS — BP 128/64 | HR 92 | Temp 98.0°F | Resp 16 | Wt 251.4 lb

## 2017-01-10 DIAGNOSIS — R079 Chest pain, unspecified: Secondary | ICD-10-CM | POA: Diagnosis not present

## 2017-01-10 DIAGNOSIS — J449 Chronic obstructive pulmonary disease, unspecified: Secondary | ICD-10-CM

## 2017-01-10 DIAGNOSIS — J438 Other emphysema: Secondary | ICD-10-CM | POA: Diagnosis not present

## 2017-01-10 DIAGNOSIS — F3132 Bipolar disorder, current episode depressed, moderate: Secondary | ICD-10-CM

## 2017-01-10 DIAGNOSIS — F339 Major depressive disorder, recurrent, unspecified: Secondary | ICD-10-CM | POA: Insufficient documentation

## 2017-01-10 DIAGNOSIS — F4325 Adjustment disorder with mixed disturbance of emotions and conduct: Secondary | ICD-10-CM | POA: Diagnosis not present

## 2017-01-10 DIAGNOSIS — F17210 Nicotine dependence, cigarettes, uncomplicated: Secondary | ICD-10-CM | POA: Insufficient documentation

## 2017-01-10 DIAGNOSIS — Z8673 Personal history of transient ischemic attack (TIA), and cerebral infarction without residual deficits: Secondary | ICD-10-CM | POA: Diagnosis not present

## 2017-01-10 DIAGNOSIS — R45851 Suicidal ideations: Secondary | ICD-10-CM

## 2017-01-10 DIAGNOSIS — J439 Emphysema, unspecified: Secondary | ICD-10-CM | POA: Diagnosis not present

## 2017-01-10 DIAGNOSIS — I252 Old myocardial infarction: Secondary | ICD-10-CM | POA: Diagnosis not present

## 2017-01-10 DIAGNOSIS — Z79899 Other long term (current) drug therapy: Secondary | ICD-10-CM | POA: Insufficient documentation

## 2017-01-10 DIAGNOSIS — F411 Generalized anxiety disorder: Secondary | ICD-10-CM | POA: Diagnosis not present

## 2017-01-10 DIAGNOSIS — R0602 Shortness of breath: Secondary | ICD-10-CM | POA: Diagnosis not present

## 2017-01-10 DIAGNOSIS — F1910 Other psychoactive substance abuse, uncomplicated: Secondary | ICD-10-CM

## 2017-01-10 DIAGNOSIS — J45909 Unspecified asthma, uncomplicated: Secondary | ICD-10-CM | POA: Insufficient documentation

## 2017-01-10 DIAGNOSIS — F331 Major depressive disorder, recurrent, moderate: Secondary | ICD-10-CM

## 2017-01-10 LAB — CBC
HEMATOCRIT: 43 % (ref 35.0–47.0)
HEMOGLOBIN: 14.5 g/dL (ref 12.0–16.0)
MCH: 31.1 pg (ref 26.0–34.0)
MCHC: 33.7 g/dL (ref 32.0–36.0)
MCV: 92.2 fL (ref 80.0–100.0)
Platelets: 276 10*3/uL (ref 150–440)
RBC: 4.67 MIL/uL (ref 3.80–5.20)
RDW: 13.8 % (ref 11.5–14.5)
WBC: 13.9 10*3/uL — ABNORMAL HIGH (ref 3.6–11.0)

## 2017-01-10 LAB — URINALYSIS, COMPLETE (UACMP) WITH MICROSCOPIC
BACTERIA UA: NONE SEEN
BILIRUBIN URINE: NEGATIVE
Glucose, UA: NEGATIVE mg/dL
HGB URINE DIPSTICK: NEGATIVE
Ketones, ur: NEGATIVE mg/dL
Leukocytes, UA: NEGATIVE
Nitrite: NEGATIVE
Protein, ur: NEGATIVE mg/dL
RBC / HPF: NONE SEEN RBC/hpf (ref 0–5)
SPECIFIC GRAVITY, URINE: 1.006 (ref 1.005–1.030)
pH: 7 (ref 5.0–8.0)

## 2017-01-10 LAB — COMPREHENSIVE METABOLIC PANEL
ALBUMIN: 3.9 g/dL (ref 3.5–5.0)
ALK PHOS: 94 U/L (ref 38–126)
ALT: 18 U/L (ref 14–54)
ANION GAP: 14 (ref 5–15)
AST: 25 U/L (ref 15–41)
BUN: 15 mg/dL (ref 6–20)
CO2: 23 mmol/L (ref 22–32)
Calcium: 9.1 mg/dL (ref 8.9–10.3)
Chloride: 98 mmol/L — ABNORMAL LOW (ref 101–111)
Creatinine, Ser: 1 mg/dL (ref 0.44–1.00)
GFR calc Af Amer: >60 mL/min (ref >60)
GFR calc non Af Amer: >60 mL/min (ref >60)
GLUCOSE: 109 mg/dL — AB (ref 65–99)
POTASSIUM: 4.3 mmol/L (ref 3.5–5.1)
SODIUM: 135 mmol/L (ref 135–145)
Total Bilirubin: 0.7 mg/dL (ref 0.3–1.2)
Total Protein: 7.3 g/dL (ref 6.5–8.1)

## 2017-01-10 LAB — URINE DRUG SCREEN, QUALITATIVE (ARMC ONLY)
Amphetamines, Ur Screen: NOT DETECTED
Barbiturates, Ur Screen: NOT DETECTED
Benzodiazepine, Ur Scrn: POSITIVE — AB
CANNABINOID 50 NG, UR ~~LOC~~: POSITIVE — AB
COCAINE METABOLITE, UR ~~LOC~~: NOT DETECTED
MDMA (ECSTASY) UR SCREEN: NOT DETECTED
Methadone Scn, Ur: NOT DETECTED
Opiate, Ur Screen: NOT DETECTED
PHENCYCLIDINE (PCP) UR S: NOT DETECTED
Tricyclic, Ur Screen: NOT DETECTED

## 2017-01-10 LAB — TROPONIN I: Troponin I: <0.03 ng/mL (ref <0.03)

## 2017-01-10 LAB — ETHANOL: Alcohol, Ethyl (B): <10 mg/dL (ref <10)

## 2017-01-10 MED ORDER — FUROSEMIDE 40 MG PO TABS
20.0000 mg | ORAL_TABLET | Freq: Every day | ORAL | Status: DC
  Administered 2017-01-10: 20 mg via ORAL
  Filled 2017-01-10: qty 1

## 2017-01-10 MED ORDER — ESCITALOPRAM OXALATE 10 MG PO TABS
20.0000 mg | ORAL_TABLET | Freq: Every day | ORAL | Status: DC
  Administered 2017-01-10: 20 mg via ORAL
  Filled 2017-01-10: qty 2

## 2017-01-10 MED ORDER — ATORVASTATIN CALCIUM 20 MG PO TABS: 40.0000 mg | ORAL_TABLET | Freq: Every day | ORAL | Status: DC

## 2017-01-10 MED ORDER — POTASSIUM CHLORIDE ER 10 MEQ PO TBCR
10.0000 meq | EXTENDED_RELEASE_TABLET | Freq: Every day | ORAL | Status: DC
  Administered 2017-01-10: 10 meq via ORAL
  Filled 2017-01-10: qty 1

## 2017-01-10 MED ORDER — ALBUTEROL SULFATE (2.5 MG/3ML) 0.083% IN NEBU: 2.5000 mg | INHALATION_SOLUTION | RESPIRATORY_TRACT | Status: DC | PRN

## 2017-01-10 MED ORDER — LURASIDONE HCL 40 MG PO TABS
40.0000 mg | ORAL_TABLET | Freq: Every evening | ORAL | Status: DC
  Filled 2017-01-10: qty 1

## 2017-01-10 MED ORDER — QUETIAPINE FUMARATE ER 300 MG PO TB24
300.0000 mg | ORAL_TABLET | Freq: Every day | ORAL | Status: DC
  Filled 2017-01-10: qty 1

## 2017-01-10 MED ORDER — ACETAMINOPHEN 500 MG PO TABS: 500.0000 mg | ORAL_TABLET | Freq: Four times a day (QID) | ORAL | Status: DC | PRN

## 2017-01-10 MED ORDER — IPRATROPIUM-ALBUTEROL 0.5-2.5 (3) MG/3ML IN SOLN
3.0000 mL | Freq: Four times a day (QID) | RESPIRATORY_TRACT | Status: DC
  Administered 2017-01-10: 3 mL via RESPIRATORY_TRACT
  Filled 2017-01-10: qty 3

## 2017-01-10 MED ORDER — ALBUTEROL SULFATE HFA 108 (90 BASE) MCG/ACT IN AERS
2.0000 | INHALATION_SPRAY | Freq: Four times a day (QID) | RESPIRATORY_TRACT | Status: DC | PRN
  Filled 2017-01-10: qty 6.7

## 2017-01-10 MED ORDER — DIVALPROEX SODIUM 250 MG PO DR TAB
250.0000 mg | DELAYED_RELEASE_TABLET | Freq: Two times a day (BID) | ORAL | Status: DC
  Administered 2017-01-10: 250 mg via ORAL
  Filled 2017-01-10: qty 1

## 2017-01-10 MED ORDER — ALPRAZOLAM 0.5 MG PO TABS
0.5000 mg | ORAL_TABLET | Freq: Three times a day (TID) | ORAL | Status: DC | PRN
  Administered 2017-01-10: 0.5 mg via ORAL
  Filled 2017-01-10: qty 1

## 2017-01-10 NOTE — ED Provider Notes (Signed)
-----------------------------------------   4:24 PM on 01/10/2017 -----------------------------------------  Patient has been seen by psychiatry they believe the patient is safe for discharge home from a psychiatric standpoint.  Patient's medical workup has been largely nonrevealing.  No significant shortness of breath or evidence of COPD exacerbation currently.  Patient will be discharged home with PCP follow-up.   Harvest Dark, MD 01/10/17 [phone]

## 2017-01-10 NOTE — Discharge Instructions (Signed)
You have been seen in the emergency department for a  psychiatric concern. You have been evaluated both medically as well as psychiatrically. Please follow-up with your outpatient resources provided. Return to the emergency department for any worsening symptoms, or any thoughts of hurting yourself or anyone else so that we may attempt to help you. 

## 2017-01-10 NOTE — Patient Instructions (Signed)
Please go directly to [facility] ER for further evaluation.  

## 2017-01-10 NOTE — ED Notes (Signed)
When asked pt about SI, pt becomes very tearful, states, "I am just confused right now."  See first nurse note.  Will adjust protocols accordingly.

## 2017-01-10 NOTE — Progress Notes (Signed)
Name: Nicole Austin   MRN: [id number]    DOB: 28-Jun-1960   Date:01/10/2017       Progress Note  Subjective  Chief Complaint  Chief Complaint  Patient presents with  . Hospitalization Follow-up    COPD    HPI  Patient presents to hospital admission follow up for COPD exacerbation.  She was discharged on 01/05/2017 and has been staying with her sister - she has been staying with her sister since May 2018.   COPD: Current Smoker; she was given a patch in the hospital and this worked while there, however the second she went home, she picked up her cigarettes again.  She was given [place] O2 for home use, but she can't use it because people are smoking in the home.  She has two days left on her prednisone, finished azithromycin. Could not afford nicotine patch or mucinex. Has albuterol PRN. Shortness of breath has remained and has not improved. Endorses intermittent mild chest pain - says this is normal for her when she is having a lot of stress and anxiety.  Depression: She has been staying with her sister since May 2018 and feels like she has lost her independence.  She is feeling very depressed, says all she wanted was to have a one bedroom apartment.  She doesn't go back to see her psychiatrist until 01/22/2017.  Says she is tired of fighting, feels like she is getting nowhere.  She has not been sleeping, has been very stressed, and feels like she has no control. Says she asks herself everyday "Why am I still here", says she has no purpose. "I just want to lay down and go to sleep and not wake up no more".  Says she does not want to kill herself and go to [place], but doesn't know what to do.  She says she is not neglected or abused.  Patient's sister brought her to her appointment today.  She endorses passive thoughts of harming others "I just want to strangle somebody".  When pressed, patient does not have a plan or any individual in mind, just feeling very frustrated.   PHQ-9 not performed in  office today due to presence acute SI and urgency to have patient present for emergency care. Depression screen [facility] 2/9 01/10/2017 10/16/2016 10/04/2016 05/07/2016 04/02/2016  Decreased Interest 0 2 3 0 0  Down, Depressed, Hopeless 1 2 3 1 1   PHQ - 2 Score 1 4 6 1 1   Altered sleeping - 3 3 - -  Tired, decreased energy - 3 3 - -  Change in appetite - 1 3 - -  Feeling bad or failure about yourself  - 2 3 - -  Trouble concentrating - 2 3 - -  Moving slowly or fidgety/restless - 2 1 - -  Suicidal thoughts - 0 3 - -  PHQ-9 Score - 17 25 - -  Difficult doing work/chores - Not difficult at all Extremely dIfficult - -  Some recent data might be hidden    Patient Active Problem List   Diagnosis Date Noted  . Medication monitoring encounter 10/16/2016  . Prediabetes 05/20/2016  . Neuropathy, peripheral 05/07/2016  . Right sided sciatica 04/19/2016  . COPD exacerbation ([place]) 02/27/2016  . Pain in right knee 01/05/2016  . Trapezius muscle spasm 12/17/2015  . Facet hypertrophy of lumbar region 11/25/2015  . Fall at home 11/25/2015  . Abnormal CBC 11/09/2015  . Vitamin B12 deficiency 10/24/2015  . Fatigue 10/04/2015  . Encounter  for medication monitoring 10/04/2015  . Hypoalbuminemia 10/04/2015  . Fibromyalgia 08/30/2015  . Low HDL (under 40) 08/30/2015  . GERD (gastroesophageal reflux disease) 08/30/2015  . Other specified behavioral and emotional disorders with onset usually occurring in childhood and adolescence 07/13/2015  . OP (osteoporosis) 06/30/2015  . Pain in thoracic spine 06/30/2015  . Personal history of other diseases of the musculoskeletal system and connective tissue 06/30/2015  . Primary malignant neoplasm ([place]) 06/30/2015  . Apnea, sleep 06/30/2015  . Controlled substance agreement signed 06/21/2015  . Other long term (current) drug therapy 06/21/2015  . HLD (hyperlipidemia) 05/13/2015  . Foot ulcer, left ([place]) 04/27/2015  . Chronic pain disorder 04/27/2015  . Chronic pain  associated with significant psychosocial dysfunction 04/27/2015  . Non-pressure ulcer of lower extremity ([place]) 04/27/2015  . Stroke ([place]) 03/25/2015  . Cerebral infarction ([place]) 03/25/2015  . Chest pain 12/31/2014  . Morbid obesity ([place]) 12/22/2014  . Bipolar affective disorder, current episode depressed ([place]) 12/22/2014  . Gout 08/16/2014  . Muscle spasms of both lower extremities 08/16/2014  . Spasm 08/16/2014  . Back pain, chronic 08/13/2014  . Low back pain with sciatica 08/04/2014  . Current tobacco use 08/04/2014  . Polysubstance abuse ([place]) 07/04/2014  . Psychoactive substance abuse ([place]) 07/04/2014  . Anxiety, generalized 11/24/2013  . Imbalance 11/24/2013  . Other abnormalities of gait and mobility 11/24/2013  . Moderate COPD (chronic obstructive pulmonary disease) ([place]) 11/18/2013  . HPV (human papilloma virus) infection 07/17/2013  . Arthritis of knee, degenerative 07/15/2013  . Personal history of other specified conditions 07/31/2011    Social History   Tobacco Use  . Smoking status: Current Every Day Smoker    Packs/day: 1.00    Years: 36.00    Pack years: 36.00    Types: Cigarettes    Start date: 10/11/1984  . Smokeless tobacco: Never Used  . Tobacco comment: Rx for Nioderm  Substance Use Topics  . Alcohol use: No    Alcohol/week: 0.0 oz     Current Outpatient Medications:  .  acetaminophen (TYLENOL) 500 MG tablet, Take 500 mg by mouth every 6 (six) hours as needed., Disp: , Rfl:  .  albuterol (PROVENTIL HFA;VENTOLIN HFA) 108 (90 Base) MCG/ACT inhaler, Inhale 2 puffs into the lungs every 6 (six) hours as needed for wheezing or shortness of breath., Disp: 1 Inhaler, Rfl: 0 .  albuterol (PROVENTIL) (2.5 MG/3ML) 0.083% nebulizer solution, Take 3 mLs (2.5 mg total) every 4 (four) hours as needed by nebulization for wheezing or shortness of breath., Disp: 75 mL, Rfl: 0 .  ALPRAZolam (XANAX) 0.5 MG tablet, Take 0.5 mg 3 (three) times daily as needed by mouth for  anxiety or sleep., Disp: , Rfl:  .  aspirin-acetaminophen-caffeine (EXCEDRIN MIGRAINE) 250-250-65 MG tablet, Take by mouth every 6 (six) hours as needed for headache., Disp: , Rfl:  .  atorvastatin (LIPITOR) 40 MG tablet, TAKE 1 TABLET BY MOUTH AT BEDTIME, Disp: 30 tablet, Rfl: 3 .  azithromycin (ZITHROMAX) 500 MG tablet, Take 1 tablet (500 mg total) daily by mouth., Disp: 2 tablet, Rfl: 0 .  divalproex (DEPAKOTE) 250 MG DR tablet, Take 1 tablet (250 mg total) by mouth 2 (two) times daily., Disp: 60 tablet, Rfl: 2 .  escitalopram (LEXAPRO) 20 MG tablet, Take 1 tablet (20 mg total) by mouth daily., Disp: 30 tablet, Rfl: 2 .  furosemide (LASIX) 20 MG tablet, One by mouth every three days for swelling, Disp: 30 tablet, Rfl: 2 .  ipratropium-albuterol (DUONEB)  0.5-2.5 (3) MG/3ML SOLN, Take 3 mLs every 6 (six) hours by nebulization., Disp: 360 mL, Rfl: 0 .  lurasidone (LATUDA) 40 MG TABS tablet, Take 40 mg every evening by mouth., Disp: , Rfl:  .  Multiple Vitamin (MULTIVITAMIN WITH MINERALS) TABS tablet, Take 1 tablet by mouth daily., Disp: , Rfl:  .  potassium chloride (K-DUR) 10 MEQ tablet, TAKE 1 TABLET BY MOUTH EVERY THREE DAYS, Disp: 10 tablet, Rfl: 2 .  predniSONE (STERAPRED UNI-PAK 21 TAB) 10 MG (21) TBPK tablet, Take 1 tablet (10 mg total) daily by mouth. Take 6 tablets by mouth for 1 day followed by  5 tablets by mouth for 1 day followed by  4 tablets by mouth for 1 day followed by  3 tablets by mouth for 1 day followed by  2 tablets by mouth for 1 day followed by  1 tablet by mouth for a day and stop, Disp: 21 tablet, Rfl: 0 .  QUEtiapine (SEROQUEL XR) 300 MG 24 hr tablet, Take 1 tablet (300 mg total) by mouth at bedtime., Disp: 30 tablet, Rfl: 2 .  ranitidine (ZANTAC) 150 MG tablet, Take 1 tablet (150 mg total) by mouth 2 (two) times daily., Disp: 60 tablet, Rfl: 2 .  Respiratory Therapy Supplies (NEBULIZER COMPRESSOR) KIT, One nebulizer kit/machine with tubing; for use with nebulized albuterol,  Disp: 1 each, Rfl: 0 .  SPIRIVA RESPIMAT 2.5 MCG/ACT AERS, INHALE 2 PUFFS INTO LUNGS DAILY, Disp: 4 g, Rfl: 6 .  guaiFENesin (MUCINEX) 600 MG 12 hr tablet, Take 1 tablet (600 mg total) 2 (two) times daily by mouth. (Patient not taking: Reported on 01/08/2017), Disp: , Rfl:  .  nicotine (NICODERM CQ - DOSED IN MG/24 HOURS) 21 mg/24hr patch, Place 1 patch (21 mg total) daily onto the skin. (Patient not taking: Reported on 01/08/2017), Disp: 28 patch, Rfl: 0  Allergies  Allergen Reactions  . Chantix [Varenicline] Nausea Only    ROS  Constitutional: Negative for fever or weight change.  Respiratory: Positive for cough and shortness of breath.   Cardiovascular: Negative for chest pain or palpitations.  Gastrointestinal: Negative for abdominal pain, no bowel changes.  Musculoskeletal: Negative for gait problem or joint swelling.  Skin: Negative for rash.  Neurological: Negative for dizziness or headache.  Psych: See HPI No other specific complaints in a complete review of systems (except as listed in HPI above).  Objective  Vitals:   01/10/17 1035  BP: 128/64  Pulse: 92  Resp: 16  Temp: 98 F (36.7 C)  TempSrc: Oral  SpO2: 92%  Weight: 251 lb 6.4 oz (114 kg)   Body mass index is 43.15 kg/m.  Nursing Note and Vital Signs reviewed.  Physical Exam  Constitutional: Patient appears well-developed and well-nourished. Obese No distress.  HEENT: head atraumatic, normocephalic Cardiovascular: Normal rate, regular rhythm, S1/S2 present.  No murmur or rub heard. No BLE edema. Pulmonary/Chest: Effort normal and breath sounds clear. No respiratory distress or retractions. Psychiatric: Patient has a depressed and tearful mood and anxious and depressed affect. behavior is appropriate for situation. Judgment and thought content clear for situation.  Recent Results (from the past 2160 hour(s))  HIV antibody     Status: None   Collection Time: 10/16/16  4:04 PM  Result Value Ref Range    HIV 1&2 Ab, 4th Generation NONREACTIVE NONREACTIVE    Comment:   HIV-1 antigen and HIV-1/HIV-2 antibodies were not detected.  There is no laboratory evidence of HIV infection.   HIV-1/2 Antibody  Diff        Not indicated. HIV-1 RNA, Qual TMA          Not indicated.     PLEASE NOTE: This information has been disclosed to you from records whose confidentiality may be protected by state law. If your state requires such protection, then the state law prohibits you from making any further disclosure of the information without the specific written consent of the person to whom it pertains, or as otherwise permitted by law. A general authorization for the release of medical or other information is NOT sufficient for this purpose.   The performance of this assay has not been clinically validated in patients less than 3 years old.   For additional information please refer to http://education.[facility].com/faq/FAQ106.  (This link is being provided for informational/educational purposes only.)     Hepatitis C antibody     Status: None   Collection Time: 10/16/16  4:04 PM  Result Value Ref Range   HCV Ab NON-REACTIVE NON-REACTIVE    Comment:                                                                        This test is for screening purposes only.  Reactive results should be confirmed by an alternative method.  Suggest HCV Qualitative, PCR, test code 83130.  Specimens will be stable for reflex testing up to 3 days after collection.   Lipid panel     Status: Abnormal   Collection Time: 10/16/16  4:29 PM  Result Value Ref Range   Cholesterol 130 <200 mg/dL   Triglycerides 203 (H) <150 mg/dL   HDL 35 (L) >50 mg/dL   Total CHOL/HDL Ratio 3.7 <5.0 Ratio   VLDL 41 (H) <30 mg/dL   LDL Cholesterol 54 <100 mg/dL  Hemoglobin A1c     Status: None   Collection Time: 10/16/16  4:29 PM  Result Value Ref Range   Hgb A1c MFr Bld 5.5 <5.7 %    Comment:   For the purpose of screening  for the presence of diabetes:   <5.7%       Consistent with the absence of diabetes 5.7-6.4 %   Consistent with increased risk for diabetes (prediabetes) >=6.5 %     Consistent with diabetes   This assay result is consistent with a decreased risk of diabetes.   Currently, no consensus exists regarding use of hemoglobin A1c for diagnosis of diabetes in children.   According to American Diabetes Association (ADA) guidelines, hemoglobin A1c <7.0% represents optimal control in non-pregnant diabetic patients. Different metrics may apply to specific patient populations. Standards of Medical Care in Diabetes (ADA).      Mean Plasma Glucose 111 mg/dL  COMPLETE METABOLIC PANEL WITH GFR     Status: None   Collection Time: 10/16/16  4:29 PM  Result Value Ref Range   Sodium 141 135 - 146 mmol/L   Potassium 4.4 3.5 - 5.3 mmol/L   Chloride 101 98 - 110 mmol/L   CO2 27 20 - 32 mmol/L    Comment: ** Please note change in reference range(s). **      Glucose, Bld 90 65 - 99 mg/dL   BUN 9 7 - 25 mg/dL  Creat 0.96 0.50 - 1.05 mg/dL    Comment:   For patients > or = 57 years of age: The upper reference limit for Creatinine is approximately 13% higher for people identified as African-American.      Total Bilirubin 0.6 0.2 - 1.2 mg/dL   Alkaline Phosphatase 108 33 - 130 U/L   AST 16 10 - 35 U/L   ALT 15 6 - 29 U/L   Total Protein 6.6 6.1 - 8.1 g/dL   Albumin 4.0 3.6 - 5.1 g/dL   Calcium 10.0 8.6 - 10.4 mg/dL   GFR, Est African American 76 >=60 mL/min   GFR, Est Non African American 66 >=60 mL/min  Magnesium     Status: None   Collection Time: 10/16/16  4:29 PM  Result Value Ref Range   Magnesium 1.8 1.5 - 2.5 mg/dL  Basic metabolic panel     Status: Abnormal   Collection Time: 11/06/16  6:24 PM  Result Value Ref Range   Sodium 136 135 - 145 mmol/L   Potassium 4.2 3.5 - 5.1 mmol/L   Chloride 98 (L) 101 - 111 mmol/L   CO2 28 22 - 32 mmol/L   Glucose, Bld 106 (H) 65 - 99 mg/dL   BUN  11 6 - 20 mg/dL   Creatinine, Ser 1.05 (H) 0.44 - 1.00 mg/dL   Calcium 9.5 8.9 - 10.3 mg/dL   GFR calc non Af Amer 58 (L) >60 mL/min   GFR calc Af Amer >60 >60 mL/min    Comment: (NOTE) The eGFR has been calculated using the CKD EPI equation. This calculation has not been validated in all clinical situations. eGFR's persistently <60 mL/min signify possible Chronic Kidney Disease.    Anion gap 10 5 - 15  CBC     Status: None   Collection Time: 11/06/16  6:24 PM  Result Value Ref Range   WBC 9.9 3.6 - 11.0 K/uL   RBC 4.51 3.80 - 5.20 MIL/uL   Hemoglobin 14.3 12.0 - 16.0 g/dL   HCT 41.1 35.0 - 47.0 %   MCV 91.2 80.0 - 100.0 fL   MCH 31.8 26.0 - 34.0 pg   MCHC 34.8 32.0 - 36.0 g/dL   RDW 13.8 11.5 - 14.5 %   Platelets 212 150 - 440 K/uL  Troponin I     Status: None   Collection Time: 11/06/16  6:24 PM  Result Value Ref Range   Troponin I <0.03 <0.03 ng/mL  Brain natriuretic peptide     Status: None   Collection Time: 11/06/16  6:24 PM  Result Value Ref Range   B Natriuretic Peptide 38.0 0.0 - 100.0 pg/mL  Troponin I     Status: None   Collection Time: 11/06/16  9:32 PM  Result Value Ref Range   Troponin I <0.03 <0.03 ng/mL  Basic metabolic panel     Status: Abnormal   Collection Time: 01/02/17  9:41 PM  Result Value Ref Range   Sodium 139 135 - 145 mmol/L   Potassium 3.2 (L) 3.5 - 5.1 mmol/L   Chloride 98 (L) 101 - 111 mmol/L   CO2 30 22 - 32 mmol/L   Glucose, Bld 112 (H) 65 - 99 mg/dL   BUN 12 6 - 20 mg/dL   Creatinine, Ser 1.10 (H) 0.44 - 1.00 mg/dL   Calcium 8.5 (L) 8.9 - 10.3 mg/dL   GFR calc non Af Amer 55 (L) >60 mL/min   GFR calc Af Amer >60 >60 mL/min  Comment: (NOTE) The eGFR has been calculated using the CKD EPI equation. This calculation has not been validated in all clinical situations. eGFR's persistently <60 mL/min signify possible Chronic Kidney Disease.    Anion gap 11 5 - 15  CBC     Status: None   Collection Time: 01/02/17  9:41 PM  Result  Value Ref Range   WBC 7.2 3.6 - 11.0 K/uL   RBC 4.48 3.80 - 5.20 MIL/uL   Hemoglobin 13.8 12.0 - 16.0 g/dL   HCT 41.2 35.0 - 47.0 %   MCV 92.0 80.0 - 100.0 fL   MCH 30.7 26.0 - 34.0 pg   MCHC 33.4 32.0 - 36.0 g/dL   RDW 13.5 11.5 - 14.5 %   Platelets 190 150 - 440 K/uL  Blood gas, venous     Status: Abnormal   Collection Time: 01/02/17  9:41 PM  Result Value Ref Range   pH, Ven 7.43 7.250 - 7.430   pCO2, Ven 54 44.0 - 60.0 mmHg   pO2, Ven 100.0 (H) 32.0 - 45.0 mmHg   Bicarbonate 35.8 (H) 20.0 - 28.0 mmol/L   Acid-Base Excess 9.5 (H) 0.0 - 2.0 mmol/L   O2 Saturation 97.9 %   Patient temperature 37.0    Collection site LINE    Sample type VENOUS   Brain natriuretic peptide     Status: None   Collection Time: 01/02/17  9:41 PM  Result Value Ref Range   B Natriuretic Peptide 52.0 0.0 - 100.0 pg/mL  Troponin I     Status: None   Collection Time: 01/02/17  9:41 PM  Result Value Ref Range   Troponin I <0.03 <0.03 ng/mL  Basic metabolic panel     Status: Abnormal   Collection Time: 01/03/17  5:49 AM  Result Value Ref Range   Sodium 138 135 - 145 mmol/L   Potassium 3.8 3.5 - 5.1 mmol/L   Chloride 96 (L) 101 - 111 mmol/L   CO2 30 22 - 32 mmol/L   Glucose, Bld 226 (H) 65 - 99 mg/dL   BUN 12 6 - 20 mg/dL   Creatinine, Ser 1.06 (H) 0.44 - 1.00 mg/dL   Calcium 8.5 (L) 8.9 - 10.3 mg/dL   GFR calc non Af Amer 57 (L) >60 mL/min   GFR calc Af Amer >60 >60 mL/min    Comment: (NOTE) The eGFR has been calculated using the CKD EPI equation. This calculation has not been validated in all clinical situations. eGFR's persistently <60 mL/min signify possible Chronic Kidney Disease.    Anion gap 12 5 - 15  CBC     Status: None   Collection Time: 01/03/17  5:49 AM  Result Value Ref Range   WBC 4.5 3.6 - 11.0 K/uL   RBC 4.22 3.80 - 5.20 MIL/uL   Hemoglobin 13.2 12.0 - 16.0 g/dL   HCT 38.9 35.0 - 47.0 %   MCV 92.2 80.0 - 100.0 fL   MCH 31.3 26.0 - 34.0 pg   MCHC 34.0 32.0 - 36.0 g/dL    RDW 13.6 11.5 - 14.5 %   Platelets 175 150 - 440 K/uL     Assessment & Plan  1. Suicidal thoughts 2. Bipolar affective disorder, currently depressed, moderate ([place]) 3. Anxiety, generalized - Patient able to contract for safety and to have sister driver her to [facility] ER for evaluation of SI. - Go directly to [facility] ER for further evaluation; report is called to [facility]. 4. Moderate COPD (chronic  obstructive pulmonary disease) ([facility]) We will follow up on this when patient is out of the hospital for SI.  Remain on current medications for the time being. Smoking cessation reinforced.  - Advised patient to call as soon as she is discharged from the hospital to schedule a follow up visit with our clinic. - Discussed case with Dr. Sanda Klein, PCP, who agrees with the plan of care.

## 2017-01-10 NOTE — ED Notes (Signed)
Pt belongings (clothes, shoes, jewelry) sent home with pt's sister, Haynes Dage.

## 2017-01-10 NOTE — Consult Note (Signed)
[place] [facility] Consult   Reason for Consult: Consult for 56 year old woman with a history of depression and multiple medical problems who was referred by her primary care doctor for symptoms of depression Referring Physician:  Eual Fines Patient Identification: Nicole Austin MRN:  [id number] Principal Diagnosis: Adjustment disorder with mixed disturbance of emotions and conduct Diagnosis:   Patient Active Problem List   Diagnosis Date Noted  . Adjustment disorder with mixed disturbance of emotions and conduct [F43.25] 01/10/2017  . Moderate recurrent major depression ([place]) [F33.1] 01/10/2017  . Medication monitoring encounter [Z51.81] 10/16/2016  . Prediabetes [R73.03] 05/20/2016  . Neuropathy, peripheral [G62.9] 05/07/2016  . Right sided sciatica [M54.31] 04/19/2016  . COPD exacerbation ([place]) [J44.1] 02/27/2016  . Pain in right knee [M25.561] 01/05/2016  . Trapezius muscle spasm [M62.838] 12/17/2015  . Facet hypertrophy of lumbar region [facility] 11/25/2015  . Fall at home [W19.Merril Abbe, P71.062] 11/25/2015  . Abnormal CBC [R79.89] 11/09/2015  . Vitamin B12 deficiency [E53.8] 10/24/2015  . Fatigue [R53.83] 10/04/2015  . Encounter for medication monitoring [Z51.81] 10/04/2015  . Hypoalbuminemia [E88.09] 10/04/2015  . Fibromyalgia [M79.7] 08/30/2015  . Low HDL (under 40) [E78.6] 08/30/2015  . GERD (gastroesophageal reflux disease) [K21.9] 08/30/2015  . Other specified behavioral and emotional disorders with onset usually occurring in childhood and adolescence [F98.8] 07/13/2015  . OP (osteoporosis) [M81.0] 06/30/2015  . Pain in thoracic spine [M54.6] 06/30/2015  . Personal history of other diseases of the musculoskeletal system and connective tissue [Z87.39] 06/30/2015  . Primary malignant neoplasm ([place]) [C80.1] 06/30/2015  . Apnea, sleep [G47.30] 06/30/2015  . Controlled substance agreement signed [Z79.899] 06/21/2015  . Other long term (current) drug therapy [Z79.899]  06/21/2015  . HLD (hyperlipidemia) [E78.5] 05/13/2015  . Foot ulcer, left ([place]) [L97.529] 04/27/2015  . Chronic pain disorder [G89.4] 04/27/2015  . Chronic pain associated with significant psychosocial dysfunction [G89.4] 04/27/2015  . Non-pressure ulcer of lower extremity ([place]) [L97.909] 04/27/2015  . Stroke ([place]) [I63.9] 03/25/2015  . Cerebral infarction ([place]) [I63.9] 03/25/2015  . Chest pain [R07.9] 12/31/2014  . Morbid obesity ([place]) [E66.01] 12/22/2014  . Bipolar affective disorder, current episode depressed ([place]) [F31.30] 12/22/2014  . Gout [M10.9] 08/16/2014  . Muscle spasms of both lower extremities [M62.838] 08/16/2014  . Spasm [R25.2] 08/16/2014  . Back pain, chronic [M54.9, G89.29] 08/13/2014  . Low back pain with sciatica [M54.40] 08/04/2014  . Current tobacco use [Z72.0] 08/04/2014  . Polysubstance abuse ([place]) [F19.10] 07/04/2014  . Psychoactive substance abuse ([place]) [F19.10] 07/04/2014  . Anxiety, generalized [F41.1] 11/24/2013  . Imbalance [R26.89] 11/24/2013  . Other abnormalities of gait and mobility [R26.89] 11/24/2013  . Moderate COPD (chronic obstructive pulmonary disease) ([place]) [J44.9] 11/18/2013  . HPV (human papilloma virus) infection [B97.7] 07/17/2013  . Arthritis of knee, degenerative [M17.10] 07/15/2013  . Personal history of other specified conditions [Z87.898] 07/31/2011    Total Time spent with patient: 1 hour  Subjective:   Nicole Austin is a 56 y.o. female patient admitted with "I just do not know how to make it anymore".  HPI: Patient interviewed chart reviewed.  56 year old woman with a history of depression.  Went to see her primary care doctor today and expressed worsening of her depression which resulted in referral to the emergency room.  Patient complains of major stresses in her life.  Over the last several months she has moved out of her independent living situation and been through some difficult social times.  She is now living with  her sister and does not  like it.  Feels that she has lost all of her independence.  Patient's mood stays anxious and down much of the time.  Cries quite a bit.  Has trouble sleeping well at night.  Appetite adequate.  Patient says she has had suicidal thoughts but has no intention or plan of acting on them.  No homicidal ideation.  Not drinking alcohol but admits to marijuana use.  She has an outpatient psychiatrist she sees regularly and has been compliant with her prescribed medicine.  Not reporting any psychotic symptoms.  Social history: Patient gets disability.  Previously lived independently but through a variety of circumstance has come to now no longer have a place of her own but is staying with her sister.  She finds it very constricting and resents losing her independence.  Medical history: Patient has severe COPD dyslipidemia overweight.  Multiple medical problems.  Substance abuse history: Regular marijuana use.  Denies alcohol abuse denies other clear substance abuse problems  Past Psychiatric History: Patient has a long history of depression.  She has been seen in consultation before.  Denies any past suicide attempts.  Has been hospitalized in the past.  No history of psychosis.  Currently seeing her outpatient psychiatrist and taking medicine regularly.  Denies overusing or abusing her medicine.    Risk to Self: Is patient at risk for suicide?: No, but patient needs Medical Clearance(Pt tearful, states, "I'm just confused right now.") Risk to Others:   Prior Inpatient Therapy:   Prior Outpatient Therapy:    Past Medical History:  Past Medical History:  Diagnosis Date  . ADHD (attention deficit hyperactivity disorder)   . Arthritis   . Asthma   . Bipolar 1 disorder ([place])   . COPD (chronic obstructive pulmonary disease) ([place])   . Depression   . GERD (gastroesophageal reflux disease) 08/30/2015  . History of acute myocardial infarction 06/30/2015   Overview:  [facility]   . Low HDL  (under 40) 08/30/2015  . MI (myocardial infarction) ([place])   . Prediabetes 05/20/2016  . PTSD (post-traumatic stress disorder)   . Rhabdomyolysis   . Stroke [facility])     Past Surgical History:  Procedure Laterality Date  . BACK SURGERY    . REPLACEMENT TOTAL KNEE Left    Family History:  Family History  Problem Relation Age of Onset  . Hernia Mother   . Heart disease Mother   . OCD Mother   . Diabetes Mother   . Parkinson's disease Father   . Bipolar disorder Sister   . Schizophrenia Sister   . ADD / ADHD Sister   . Alcohol abuse Brother   . Bipolar disorder Sister   . Paranoid behavior Sister   . ADD / ADHD Sister   . ADD / ADHD Son   . Dementia Maternal Grandmother   . Emphysema Maternal Grandfather   . ADD / ADHD Son   . ADD / ADHD Son   . Depression Son    Family Psychiatric  History: Positive for depression and anxiety Social History:  Social History   Substance and Sexual Activity  Alcohol Use No  . Alcohol/week: 0.0 oz     Social History   Substance and Sexual Activity  Drug Use No    Social History   Socioeconomic History  . Marital status: Single    Spouse name: None  . Number of children: None  . Years of education: None  . Highest education level: None  Social Needs  . Emergency planning/management officer  strain: Somewhat hard  . Food insecurity - worry: Sometimes true  . Food insecurity - inability: Sometimes true  . Transportation needs - medical: No  . Transportation needs - non-medical: No  Occupational History  . Occupation: Disability  Tobacco Use  . Smoking status: Current Every Day Smoker    Packs/day: 1.00    Years: 36.00    Pack years: 36.00    Types: Cigarettes    Start date: 10/11/1984  . Smokeless tobacco: Never Used  . Tobacco comment: Rx for Nioderm  Substance and Sexual Activity  . Alcohol use: No    Alcohol/week: 0.0 oz  . Drug use: No  . Sexual activity: No  Other Topics Concern  . None  Social History Narrative   Pt is currently  homeless but living with her sister temporarily. Has reached out to [employer] with housing and food. Sister has agreed to assist with Transportation to all appts and to [employer]. Pt has also made arrangements with [facility] for assistance with affording Nicoderm and Mucinex.   Additional Social History:    Allergies:   Allergies  Allergen Reactions  . Chantix [Varenicline] Nausea Only    Labs:  Results for orders placed or performed during the hospital encounter of 01/10/17 (from the past 48 hour(s))  Troponin I     Status: None   Collection Time: 01/10/17 12:24 PM  Result Value Ref Range   Troponin I <0.03 <0.03 ng/mL  Comprehensive metabolic panel     Status: Abnormal   Collection Time: 01/10/17 12:24 PM  Result Value Ref Range   Sodium 135 135 - 145 mmol/L   Potassium 4.3 3.5 - 5.1 mmol/L   Chloride 98 (L) 101 - 111 mmol/L   CO2 23 22 - 32 mmol/L   Glucose, Bld 109 (H) 65 - 99 mg/dL   BUN 15 6 - 20 mg/dL   Creatinine, Ser 1.00 0.44 - 1.00 mg/dL   Calcium 9.1 8.9 - 10.3 mg/dL   Total Protein 7.3 6.5 - 8.1 g/dL   Albumin 3.9 3.5 - 5.0 g/dL   AST 25 15 - 41 U/L   ALT 18 14 - 54 U/L   Alkaline Phosphatase 94 38 - 126 U/L   Total Bilirubin 0.7 0.3 - 1.2 mg/dL   GFR calc non Af Amer >60 >60 mL/min   GFR calc Af Amer >60 >60 mL/min    Comment: (NOTE) The eGFR has been calculated using the CKD EPI equation. This calculation has not been validated in all clinical situations. eGFR's persistently <60 mL/min signify possible Chronic Kidney Disease.    Anion gap 14 5 - 15  Ethanol     Status: None   Collection Time: 01/10/17 12:24 PM  Result Value Ref Range   Alcohol, Ethyl (B) <10 <10 mg/dL    Comment:        LOWEST DETECTABLE LIMIT FOR SERUM ALCOHOL IS 10 mg/dL FOR MEDICAL PURPOSES ONLY   cbc     Status: Abnormal   Collection Time: 01/10/17 12:24 PM  Result Value Ref Range   WBC 13.9 (H) 3.6 - 11.0 K/uL   RBC 4.67 3.80 - 5.20 MIL/uL    Hemoglobin 14.5 12.0 - 16.0 g/dL   HCT 43.0 35.0 - 47.0 %   MCV 92.2 80.0 - 100.0 fL   MCH 31.1 26.0 - 34.0 pg   MCHC 33.7 32.0 - 36.0 g/dL   RDW 13.8 11.5 - 14.5 %   Platelets 276 150 -  440 K/uL  Urine Drug Screen, Qualitative     Status: Abnormal   Collection Time: 01/10/17 12:24 PM  Result Value Ref Range   Tricyclic, Ur Screen NONE DETECTED NONE DETECTED   Amphetamines, Ur Screen NONE DETECTED NONE DETECTED   MDMA (Ecstasy)Ur Screen NONE DETECTED NONE DETECTED   Cocaine Metabolite,Ur [place] NONE DETECTED NONE DETECTED   Opiate, Ur Screen NONE DETECTED NONE DETECTED   Phencyclidine (PCP) Ur S NONE DETECTED NONE DETECTED   Cannabinoid 50 Ng, Ur Ages POSITIVE (A) NONE DETECTED   Barbiturates, Ur Screen NONE DETECTED NONE DETECTED   Benzodiazepine, Ur Scrn POSITIVE (A) NONE DETECTED   Methadone Scn, Ur NONE DETECTED NONE DETECTED    Comment: (NOTE) 528  Tricyclics, urine               Cutoff 1000 ng/mL 200  Amphetamines, urine             Cutoff 1000 ng/mL 300  MDMA (Ecstasy), urine           Cutoff 500 ng/mL 400  Cocaine Metabolite, urine       Cutoff 300 ng/mL 500  Opiate, urine                   Cutoff 300 ng/mL 600  Phencyclidine (PCP), urine      Cutoff 25 ng/mL 700  Cannabinoid, urine              Cutoff 50 ng/mL 800  Barbiturates, urine             Cutoff 200 ng/mL 900  Benzodiazepine, urine           Cutoff 200 ng/mL 1000 Methadone, urine                Cutoff 300 ng/mL 1100 1200 The urine drug screen provides only a preliminary, unconfirmed 1300 analytical test result and should not be used for non-medical 1400 purposes. Clinical consideration and professional judgment should 1500 be applied to any positive drug screen result due to possible 1600 interfering substances. A more specific alternate chemical method 1700 must be used in order to obtain a confirmed analytical result.  1800 Gas chromato graphy / mass spectrometry (GC/MS) is the preferred 1900 confirmatory  method.   Urinalysis, Complete w Microscopic     Status: Abnormal   Collection Time: 01/10/17 12:24 PM  Result Value Ref Range   Color, Urine STRAW (A) YELLOW   APPearance CLEAR (A) CLEAR   Specific Gravity, Urine 1.006 1.005 - 1.030   pH 7.0 5.0 - 8.0   Glucose, UA NEGATIVE NEGATIVE mg/dL   Hgb urine dipstick NEGATIVE NEGATIVE   Bilirubin Urine NEGATIVE NEGATIVE   Ketones, ur NEGATIVE NEGATIVE mg/dL   Protein, ur NEGATIVE NEGATIVE mg/dL   Nitrite NEGATIVE NEGATIVE   Leukocytes, UA NEGATIVE NEGATIVE   RBC / HPF NONE SEEN 0 - 5 RBC/hpf   WBC, UA 0-5 0 - 5 WBC/hpf   Bacteria, UA NONE SEEN NONE SEEN   Squamous Epithelial / LPF 0-5 (A) NONE SEEN    Current Facility-Administered Medications  Medication Dose Route Frequency Provider Last Rate Last Dose  . acetaminophen (TYLENOL) tablet 500 mg  500 mg Oral Q6H PRN Alfred Levins, [place], MD      . albuterol (PROVENTIL HFA;VENTOLIN HFA) 108 (90 Base) MCG/ACT inhaler 2 puff  2 puff Inhalation Q6H PRN Alfred Levins, [place], MD      . albuterol (PROVENTIL) (2.5 MG/3ML) 0.083% nebulizer solution 2.5 mg  2.5  mg Nebulization Q4H PRN Alfred Levins, [place], MD      . ALPRAZolam Duanne Moron) tablet 0.5 mg  0.5 mg Oral TID PRN Rudene Re, MD   0.5 mg at 01/10/17 1516  . atorvastatin (LIPITOR) tablet 40 mg  40 mg Oral QHS [place], [place], MD      . divalproex (DEPAKOTE) DR tablet 250 mg  250 mg Oral BID Alfred Levins, [place], MD   250 mg at 01/10/17 1502  . escitalopram (LEXAPRO) tablet 20 mg  20 mg Oral Daily Alfred Levins, [place], MD   20 mg at 01/10/17 1501  . furosemide (LASIX) tablet 20 mg  20 mg Oral Daily Alfred Levins, [place], MD   20 mg at 01/10/17 1501  . ipratropium-albuterol (DUONEB) 0.5-2.5 (3) MG/3ML nebulizer solution 3 mL  3 mL Nebulization Q6H Alfred Levins, [place], MD   3 mL at 01/10/17 1517  . lurasidone (LATUDA) tablet 40 mg  40 mg Oral QPM Alfred Levins, [place], MD      . potassium chloride (K-DUR) CR tablet 10 mEq  10 mEq Oral Daily Alfred Levins,  [place], MD   10 mEq at 01/10/17 1502  . QUEtiapine (SEROQUEL XR) 24 hr tablet 300 mg  300 mg Oral QHS Rudene Re, MD       Current Outpatient Medications  Medication Sig Dispense Refill  . acetaminophen (TYLENOL) 500 MG tablet Take 500 mg by mouth every 6 (six) hours as needed.    Marland Kitchen albuterol (PROVENTIL HFA;VENTOLIN HFA) 108 (90 Base) MCG/ACT inhaler Inhale 2 puffs into the lungs every 6 (six) hours as needed for wheezing or shortness of breath. 1 Inhaler 0  . albuterol (PROVENTIL) (2.5 MG/3ML) 0.083% nebulizer solution Take 3 mLs (2.5 mg total) every 4 (four) hours as needed by nebulization for wheezing or shortness of breath. 75 mL 0  . ALPRAZolam (XANAX) 0.5 MG tablet Take 0.5 mg 3 (three) times daily as needed by mouth for anxiety or sleep.    Marland Kitchen aspirin-acetaminophen-caffeine (EXCEDRIN MIGRAINE) 250-250-65 MG tablet Take by mouth every 6 (six) hours as needed for headache.    Marland Kitchen atorvastatin (LIPITOR) 40 MG tablet TAKE 1 TABLET BY MOUTH AT BEDTIME 30 tablet 3  . divalproex (DEPAKOTE) 250 MG DR tablet Take 1 tablet (250 mg total) by mouth 2 (two) times daily. 60 tablet 2  . escitalopram (LEXAPRO) 20 MG tablet Take 1 tablet (20 mg total) by mouth daily. 30 tablet 2  . furosemide (LASIX) 20 MG tablet One by mouth every three days for swelling 30 tablet 2  . ipratropium-albuterol (DUONEB) 0.5-2.5 (3) MG/3ML SOLN Take 3 mLs every 6 (six) hours by nebulization. 360 mL 0  . lurasidone (LATUDA) 40 MG TABS tablet Take 40 mg every evening by mouth.    . Multiple Vitamin (MULTIVITAMIN WITH MINERALS) TABS tablet Take 1 tablet by mouth daily.    . potassium chloride (K-DUR) 10 MEQ tablet TAKE 1 TABLET BY MOUTH EVERY THREE DAYS 10 tablet 2  . QUEtiapine (SEROQUEL XR) 300 MG 24 hr tablet Take 1 tablet (300 mg total) by mouth at bedtime. 30 tablet 2  . ranitidine (ZANTAC) 150 MG tablet Take 1 tablet (150 mg total) by mouth 2 (two) times daily. 60 tablet 2  . Respiratory Therapy Supplies (NEBULIZER  COMPRESSOR) KIT One nebulizer kit/machine with tubing; for use with nebulized albuterol 1 each 0  . SPIRIVA RESPIMAT 2.5 MCG/ACT AERS INHALE 2 PUFFS INTO LUNGS DAILY 4 g 6    Musculoskeletal: Strength & Muscle Tone: within normal limits Gait & Station: unsteady Patient  leans: N/A  Psychiatric Specialty Exam: Physical Exam  Nursing note and vitals reviewed. Constitutional: She appears well-developed and well-nourished.  HENT:  Head: Normocephalic and atraumatic.  Eyes: Conjunctivae are normal. Pupils are equal, round, and reactive to light.  Neck: Normal range of motion.  Cardiovascular: Regular rhythm and normal heart sounds.  Respiratory: She is in respiratory distress.  GI: Soft.  Musculoskeletal: Normal range of motion.  Neurological: She is alert.  Skin: Skin is warm and dry.  Psychiatric: Her mood appears anxious. Her speech is tangential. She is agitated. She is not aggressive. Thought content is not paranoid. Cognition and memory are normal. She expresses impulsivity. She exhibits a depressed mood. She expresses no homicidal and no suicidal ideation.    Review of Systems  Constitutional: Negative.   HENT: Negative.   Eyes: Negative.   Respiratory: Positive for shortness of breath.   Cardiovascular: Negative.   Gastrointestinal: Negative.   Musculoskeletal: Negative.   Skin: Negative.   Neurological: Negative.   Psychiatric/Behavioral: Positive for depression. Negative for hallucinations, memory loss, substance abuse and suicidal ideas. The patient is nervous/anxious. The patient does not have insomnia.     Blood pressure 122/74, pulse 72, temperature 97.9 F (36.6 C), temperature source Oral, resp. rate 18, height 5' 1"  (1.549 m), weight 113.9 kg (251 lb), SpO2 95 %.Body mass index is 47.43 kg/m.  General Appearance: Casual  Eye Contact:  Good  Speech:  Clear and Coherent  Volume:  Increased  Mood:  Anxious and Depressed  Affect:  Congruent and Labile  Thought  Process:  Coherent  Orientation:  Full (Time, Place, and Person)  Thought Content:  Logical and Tangential  Suicidal Thoughts:  Yes.  without intent/plan  Homicidal Thoughts:  No  Memory:  Immediate;   Fair Recent;   Fair Remote;   Fair  Judgement:  Fair  Insight:  Fair  Psychomotor Activity:  Decreased  Concentration:  Concentration: Fair  Recall:  [employer] of Knowledge:  Fair  Language:  Fair  Akathisia:  No  Handed:  Right  AIMS (if indicated):     Assets:  Desire for Improvement Resilience Social Support  ADL's:  Intact  Cognition:  WNL  Sleep:        Treatment Plan Summary: Plan 56 year old woman with chronic depression and anxiety.  She presents sad today and wanting to talk about her stresses.  Very demonstrative dramatic conversation.  Nevertheless the patient multiple times states she has no intention of hurting herself.  Is not psychotic.  Patient really does not require inpatient psychiatric treatment.  Her outpatient medicines are appropriate and there is no clear indication to change anything.  Spent time doing supportive therapy and cognitive therapy helping her to focus on planning for the future instead of being overwhelmed by her emotions.  Case reviewed with emergency room doctor and TTS.  Discontinue IVC she can be discharged home and follow-up with Dr. Collie Siad.  Disposition: Patient does not meet criteria for psychiatric inpatient admission. Supportive therapy provided about ongoing stressors. Discussed crisis plan, support from social network, calling 911, coming to the Emergency Department, and calling Suicide Hotline.  Alethia Berthold, MD 01/10/2017 6:16 PM

## 2017-01-10 NOTE — ED Notes (Addendum)
FIRST NURSE NOTE: Provider from [place] called about patient to inform RN that pt was seen in office and referred to ER for SI ideation. Hx of polysubstance abuse and bipolar  Pt provided wheelchair on arrival. Pt alert and oriented X4, active, cooperative, pt in NAD. RR even and unlabored, color WNL.

## 2017-01-10 NOTE — ED Triage Notes (Signed)
Pt in via POV, sent over PCP, being seen today for follow up due from recent hospitalization, advised to be further evaluated here.  Pt reports being sent home from hospital with home oxygen and nebulizers, pt states, "I chose to smoke rather than using oxygen and nebulizers."  NAD noted at this time.

## 2017-01-10 NOTE — ED Provider Notes (Signed)
[facility] Emergency Department Provider Note  ____________________________________________  Time seen: Approximately 2:31 PM  I have reviewed the triage vital signs and the nursing notes.   HISTORY  Chief Complaint Shortness of Breath   HPI Nicole Austin is a 56 y.o. female with a history of asthma, COPD, smoking, PTSD, bipolar disorder, ADHD who presents from her primary care doctor's office for evaluation of suicidal ideation. Patient was there for a checkup after being diagnosed with a COPD exacerbation.Patient endorses suicidal ideation and worsening depression during her visit in which prompted her to be sent to the emergency room for evaluation. Patient reports that she has been very sad for several months since losing her apartment. She is currently living with her sister and a very small bedroom. She is feeling overwhelmed. She's been having suicidal thoughts for several months with no plan. She has tried to kill herself in the past by cutting. She endorses compliance with her psychiatric medications. Patient is also complaining of mild shortness of breath which she has had for several months. She reports that she does not use the oxygen that was given to her or her inhalers but continues to smoke. No fever or chills, no chest pain.  Past Medical History:  Diagnosis Date  . ADHD (attention deficit hyperactivity disorder)   . Arthritis   . Asthma   . Bipolar 1 disorder ([place])   . COPD (chronic obstructive pulmonary disease) ([place])   . Depression   . GERD (gastroesophageal reflux disease) 08/30/2015  . History of acute myocardial infarction 06/30/2015   Overview:  [facility]   . Low HDL (under 40) 08/30/2015  . MI (myocardial infarction) ([place])   . Prediabetes 05/20/2016  . PTSD (post-traumatic stress disorder)   . Rhabdomyolysis   . Stroke [facility])     Patient Active Problem List   Diagnosis Date Noted  . Medication monitoring encounter 10/16/2016  .  Prediabetes 05/20/2016  . Neuropathy, peripheral 05/07/2016  . Right sided sciatica 04/19/2016  . COPD exacerbation ([place]) 02/27/2016  . Pain in right knee 01/05/2016  . Trapezius muscle spasm 12/17/2015  . Facet hypertrophy of lumbar region 11/25/2015  . Fall at home 11/25/2015  . Abnormal CBC 11/09/2015  . Vitamin B12 deficiency 10/24/2015  . Fatigue 10/04/2015  . Encounter for medication monitoring 10/04/2015  . Hypoalbuminemia 10/04/2015  . Fibromyalgia 08/30/2015  . Low HDL (under 40) 08/30/2015  . GERD (gastroesophageal reflux disease) 08/30/2015  . Other specified behavioral and emotional disorders with onset usually occurring in childhood and adolescence 07/13/2015  . OP (osteoporosis) 06/30/2015  . Pain in thoracic spine 06/30/2015  . Personal history of other diseases of the musculoskeletal system and connective tissue 06/30/2015  . Primary malignant neoplasm ([place]) 06/30/2015  . Apnea, sleep 06/30/2015  . Controlled substance agreement signed 06/21/2015  . Other long term (current) drug therapy 06/21/2015  . HLD (hyperlipidemia) 05/13/2015  . Foot ulcer, left ([place]) 04/27/2015  . Chronic pain disorder 04/27/2015  . Chronic pain associated with significant psychosocial dysfunction 04/27/2015  . Non-pressure ulcer of lower extremity ([place]) 04/27/2015  . Stroke ([place]) 03/25/2015  . Cerebral infarction ([place]) 03/25/2015  . Chest pain 12/31/2014  . Morbid obesity ([place]) 12/22/2014  . Bipolar affective disorder, current episode depressed ([place]) 12/22/2014  . Gout 08/16/2014  . Muscle spasms of both lower extremities 08/16/2014  . Spasm 08/16/2014  . Back pain, chronic 08/13/2014  . Low back pain with sciatica 08/04/2014  . Current tobacco use 08/04/2014  .  Polysubstance abuse ([place]) 07/04/2014  . Psychoactive substance abuse ([place]) 07/04/2014  . Anxiety, generalized 11/24/2013  . Imbalance 11/24/2013  . Other abnormalities of gait and mobility 11/24/2013  . Moderate COPD  (chronic obstructive pulmonary disease) ([place]) 11/18/2013  . HPV (human papilloma virus) infection 07/17/2013  . Arthritis of knee, degenerative 07/15/2013  . Personal history of other specified conditions 07/31/2011    Past Surgical History:  Procedure Laterality Date  . BACK SURGERY    . REPLACEMENT TOTAL KNEE Left     Prior to Admission medications   Medication Sig Start Date End Date Taking? Authorizing Provider  acetaminophen (TYLENOL) 500 MG tablet Take 500 mg by mouth every 6 (six) hours as needed.    [provider]  albuterol (PROVENTIL HFA;VENTOLIN HFA) 108 (90 Base) MCG/ACT inhaler Inhale 2 puffs into the lungs every 6 (six) hours as needed for wheezing or shortness of breath. 05/31/15   Orbie Pyo, MD  albuterol (PROVENTIL) (2.5 MG/3ML) 0.083% nebulizer solution Take 3 mLs (2.5 mg total) every 4 (four) hours as needed by nebulization for wheezing or shortness of breath. 01/05/17   Nicholes Mango, MD  ALPRAZolam Duanne Moron) 0.5 MG tablet Take 0.5 mg 3 (three) times daily as needed by mouth for anxiety or sleep.    [provider]  aspirin-acetaminophen-caffeine (EXCEDRIN MIGRAINE) 208 [id number] MG tablet Take by mouth every 6 (six) hours as needed for headache.    [provider]  atorvastatin (LIPITOR) 40 MG tablet TAKE 1 TABLET BY MOUTH AT BEDTIME 11/19/16   Lada, Satira Anis, MD  divalproex (DEPAKOTE) 250 MG DR tablet Take 1 tablet (250 mg total) by mouth 2 (two) times daily. 11/19/16   Rainey Pines, MD  escitalopram (LEXAPRO) 20 MG tablet Take 1 tablet (20 mg total) by mouth daily. 11/19/16   Rainey Pines, MD  furosemide (LASIX) 20 MG tablet One by mouth every three days for swelling 07/30/16 01/02/18  Rainey Pines, MD  ipratropium-albuterol (DUONEB) 0.5-2.5 (3) MG/3ML SOLN Take 3 mLs every 6 (six) hours by nebulization. 01/05/17   Gouru, Illene Silver, MD  lurasidone (LATUDA) 40 MG TABS tablet Take 40 mg every evening by mouth.    [provider]    Multiple Vitamin (MULTIVITAMIN WITH MINERALS) TABS tablet Take 1 tablet by mouth daily.    [provider]  potassium chloride (K-DUR) 10 MEQ tablet TAKE 1 TABLET BY MOUTH EVERY THREE DAYS 01/07/17   Arnetha Courser, MD  QUEtiapine (SEROQUEL XR) 300 MG 24 hr tablet Take 1 tablet (300 mg total) by mouth at bedtime. 11/19/16   Rainey Pines, MD  ranitidine (ZANTAC) 150 MG tablet Take 1 tablet (150 mg total) by mouth 2 (two) times daily. 08/29/15   Arnetha Courser, MD  Respiratory Therapy Supplies (NEBULIZER COMPRESSOR) KIT One nebulizer kit/machine with tubing; for use with nebulized albuterol 02/10/16   Arnetha Courser, MD  SPIRIVA RESPIMAT 2.5 MCG/ACT AERS INHALE 2 PUFFS INTO LUNGS DAILY 10/26/16   Arnetha Courser, MD    Allergies Chantix [varenicline]  Family History  Problem Relation Age of Onset  . Hernia Mother   . Heart disease Mother   . OCD Mother   . Diabetes Mother   . Parkinson's disease Father   . Bipolar disorder Sister   . Schizophrenia Sister   . ADD / ADHD Sister   . Alcohol abuse Brother   . Bipolar disorder Sister   . Paranoid behavior Sister   . ADD / ADHD Sister   .  ADD / ADHD Son   . Dementia Maternal Grandmother   . Emphysema Maternal Grandfather   . ADD / ADHD Son   . ADD / ADHD Son   . Depression Son     Social History Social History   Tobacco Use  . Smoking status: Current Every Day Smoker    Packs/day: 1.00    Years: 36.00    Pack years: 36.00    Types: Cigarettes    Start date: 10/11/1984  . Smokeless tobacco: Never Used  . Tobacco comment: Rx for Nioderm  Substance Use Topics  . Alcohol use: No    Alcohol/week: 0.0 oz  . Drug use: No    Review of Systems  Constitutional: Negative for fever. Eyes: Negative for visual changes. ENT: Negative for sore throat. Neck: No neck pain  Cardiovascular: Negative for chest pain. Respiratory: + shortness of breath. Gastrointestinal: Negative for abdominal pain, vomiting or  diarrhea. Genitourinary: Negative for dysuria. Musculoskeletal: Negative for back pain. Skin: Negative for rash. Neurological: Negative for headaches, weakness or numbness. Psych: + SI. No HI  ____________________________________________   PHYSICAL EXAM:  VITAL SIGNS: ED Triage Vitals  Enc Vitals Group     BP 01/10/17 1142 122/74     Pulse Rate 01/10/17 1142 72     Resp 01/10/17 1142 18     Temp 01/10/17 1142 97.9 F (36.6 C)     Temp Source 01/10/17 1142 Oral     SpO2 01/10/17 1142 95 %     Weight 01/10/17 1142 251 lb (113.9 kg)     Height 01/10/17 1142 5' 1" (1.549 m)     Head Circumference --      Peak Flow --      Pain Score 01/10/17 1202 9     Pain Loc --      Pain Edu? --      Excl. in [place]? --     Constitutional: Alert and oriented, tearful.  HEENT:      Head: Normocephalic and atraumatic.         Eyes: Conjunctivae are normal. Sclera is non-icteric.       Mouth/Throat: Mucous membranes are moist.       Neck: Supple with no signs of meningismus. Cardiovascular: Regular rate and rhythm. No murmurs, gallops, or rubs. 2+ symmetrical distal pulses are present in all extremities. No JVD. Respiratory: Normal respiratory effort. Lungs are clear to auscultation bilaterally with faint wheezes.  Gastrointestinal: Soft, non tender, and non distended with positive bowel sounds. No rebound or guarding. Musculoskeletal: Nontender with normal range of motion in all extremities. No edema, cyanosis, or erythema of extremities. Neurologic: Normal speech and language. Face is symmetric. Moving all extremities. No gross focal neurologic deficits are appreciated. Skin: Skin is warm, dry and intact. No rash noted. Psychiatric: Mood and affect are normal. Speech and behavior are normal.  ____________________________________________   LABS (all labs ordered are listed, but only abnormal results are displayed)  Labs Reviewed  COMPREHENSIVE METABOLIC PANEL - Abnormal; Notable for the  following components:      Result Value   Chloride 98 (*)    Glucose, Bld 109 (*)    All other components within normal limits  CBC - Abnormal; Notable for the following components:   WBC 13.9 (*)    All other components within normal limits  URINE DRUG SCREEN, QUALITATIVE ([facility] ONLY) - Abnormal; Notable for the following components:   Cannabinoid 50 Ng, Ur [place] POSITIVE (*)    Benzodiazepine,  Ur Scrn POSITIVE (*)    All other components within normal limits  TROPONIN I  ETHANOL   ____________________________________________  EKG  ED ECG REPORT I, Rudene Re, the attending physician, personally viewed and interpreted this ECG.  Normal sinus rhythm, rate of 62, normal intervals, normal axis, no ST elevations or depressions.  ____________________________________________  RADIOLOGY  CXR:  Chronic bronchitic changes, likely smoking-related. No active cardiopulmonary disease ____________________________________________   PROCEDURES  Procedure(s) performed: None Procedures Critical Care performed:  None ____________________________________________   INITIAL IMPRESSION / ASSESSMENT AND PLAN / ED COURSE  56 y.o. female with a history of asthma, COPD, smoking, PTSD, bipolar disorder, ADHD who presents from her primary care doctor's office for evaluation of suicidal ideation. Patient endorsing SI, prior h/o SA. IVC papers taken and psych consulted.   # SOB: COPD, normal sats, normal WOB, faint wheezes, patient does not use nebs at home and continues to smoke. Will re-start COPD home meds. EKG with no ischemia. Labs showing slight leukocytosis (patient recently on steroids, no fever and CXR negative for PNA). Patient is medically cleared.       As part of my medical decision making, I reviewed the following data within the [place] notes reviewed and incorporated, Labs reviewed , EKG interpreted , Old chart reviewed, Radiograph reviewed , A  consult was requested and obtained from this/these consultant(s) Psychiatry, Notes from prior ED visits and [place] Controlled Substance Database    Pertinent labs & imaging results that were available during my care of the patient were reviewed by me and considered in my medical decision making (see chart for details).    ____________________________________________   FINAL CLINICAL IMPRESSION(S) / ED DIAGNOSES  Final diagnoses:  Pulmonary emphysema, unspecified emphysema type ([place])  Episode of recurrent major depressive disorder, unspecified depression episode severity ([place])  Suicidal ideation      NEW MEDICATIONS STARTED DURING THIS VISIT:  This SmartLink is deprecated. Use AVSMEDLIST instead to display the medication list for a patient.   Note:  This document was prepared using Dragon voice recognition software and may include unintentional dictation errors.    Rudene Re, MD 01/10/17 [phone]

## 2017-01-15 ENCOUNTER — Ambulatory Visit: Payer: Self-pay | Admitting: Family Medicine

## 2017-01-22 DIAGNOSIS — Z79899 Other long term (current) drug therapy: Secondary | ICD-10-CM | POA: Diagnosis not present

## 2017-01-23 ENCOUNTER — Other Ambulatory Visit: Payer: Self-pay | Admitting: Family Medicine

## 2017-01-23 NOTE — Telephone Encounter (Signed)
Too soon; four months approved in late Sept; not due until late January

## 2017-01-24 ENCOUNTER — Other Ambulatory Visit: Payer: Self-pay

## 2017-01-24 MED ORDER — ATORVASTATIN CALCIUM 40 MG PO TABS: 40.0000 mg | ORAL_TABLET | Freq: Every day | ORAL | 1 refills | Status: DC

## 2017-01-24 NOTE — Telephone Encounter (Signed)
Insurance request 90 days.

## 2017-01-24 NOTE — Telephone Encounter (Signed)
Reviewed last sgpt and lipids; rx approved 

## 2017-01-29 ENCOUNTER — Ambulatory Visit: Payer: Self-pay | Admitting: Family Medicine

## 2017-02-02 IMAGING — CR DG CHEST 1V PORT
1 series · 1 of 1 positions shown · non-contrast
Comparison: 10/20/2013

CLINICAL DATA: Cough since yesterday

EXAM:
PORTABLE CHEST - 1 VIEW

[ap]
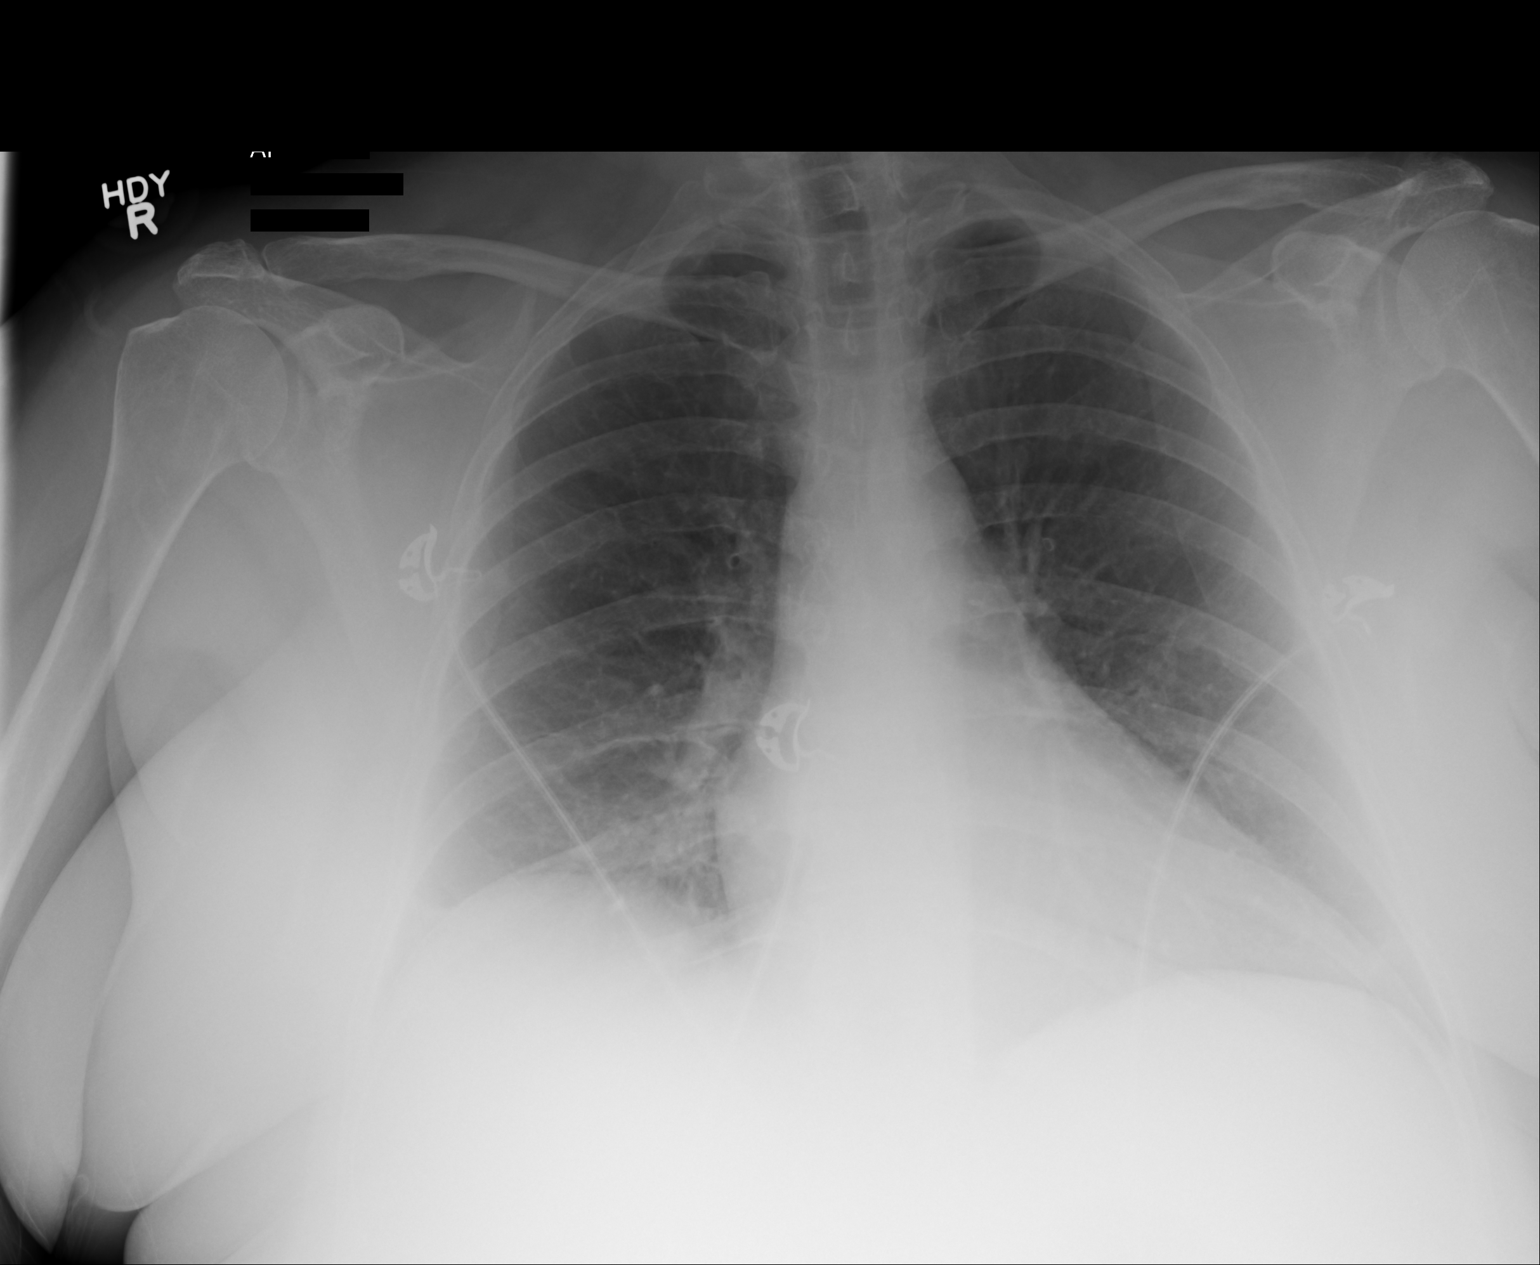

[1 of 1 positions shown; findings below may reference images not displayed]

FINDINGS: The heart size and mediastinal contours are within normal limits.
Both lungs are clear. The visualized skeletal structures are
unremarkable.
IMPRESSION: No active disease.

## 2017-02-02 IMAGING — CT CT HEAD WITHOUT CONTRAST
1 series · 16 of 30 positions shown, 20 images · non-contrast
Comparison: None

CLINICAL DATA: Acute onset of generalized weakness, swelling and
severe BILATERAL lower extremity pain, confusion, nausea, vomiting,
leukocytosis, dark urine

EXAM:
CT HEAD WITHOUT CONTRAST
TECHNIQUE: Contiguous axial images were obtained from the base of the skull
through the vertex without intravenous contrast.

[Series 2: head wo · axial · 0.46mm/px · z∈[-138,-12]mm · 16 of 32 slices shown, 20 images]
[im 2/32  brain]
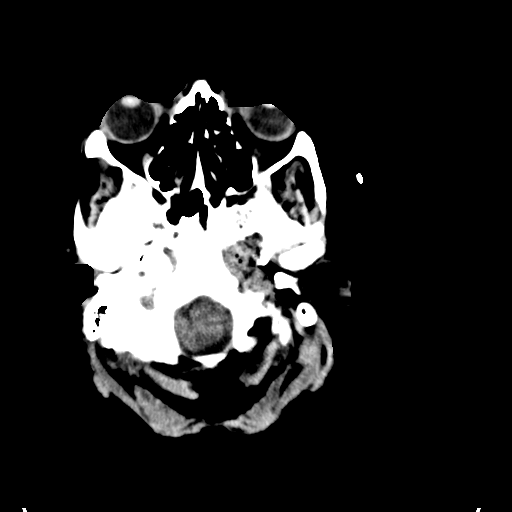
[im 2/32  bone]
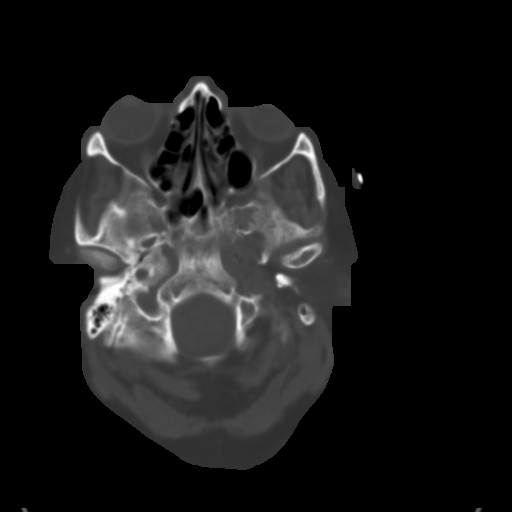
[im 4/32  brain]
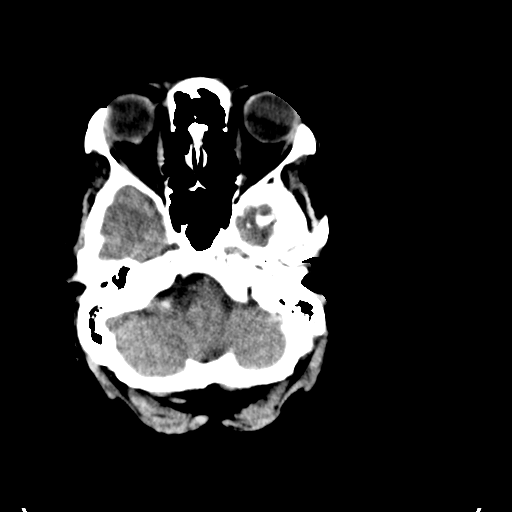
[im 6/32  brain]
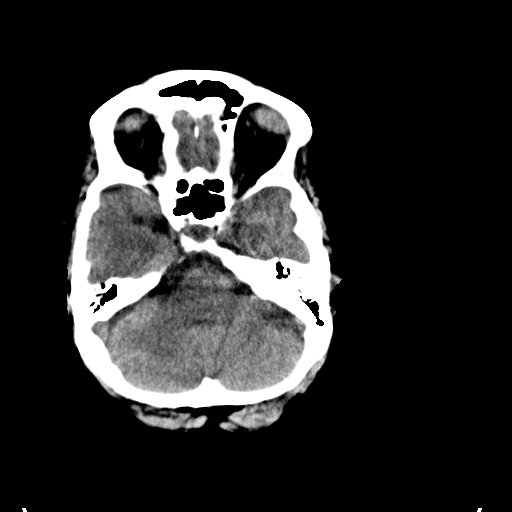
[im 8/32  brain]
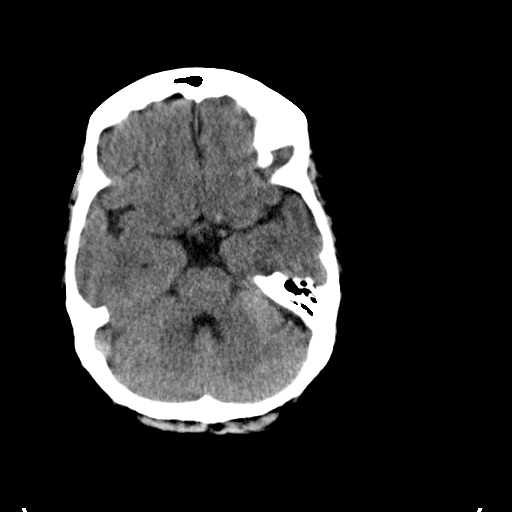
[im 9/32  brain]
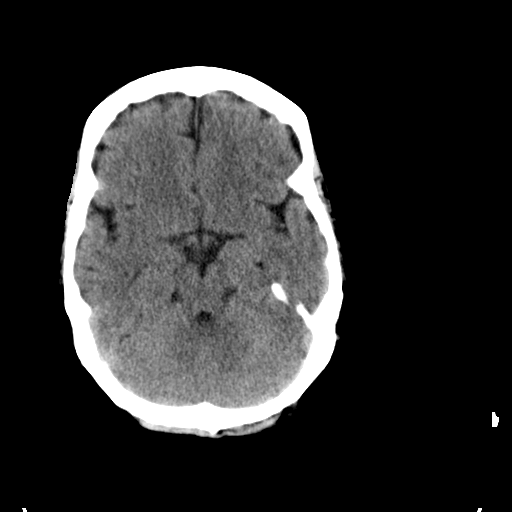
[im 9/32  bone]
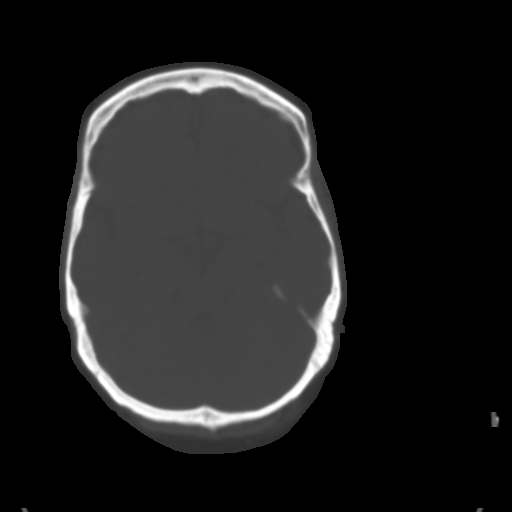
[im 11/32  brain]
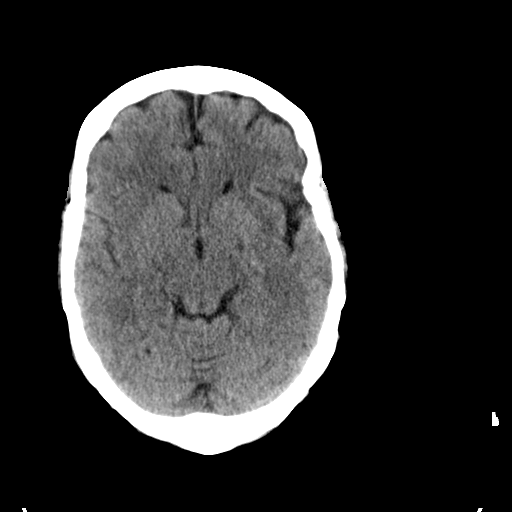
[im 13/32  brain]
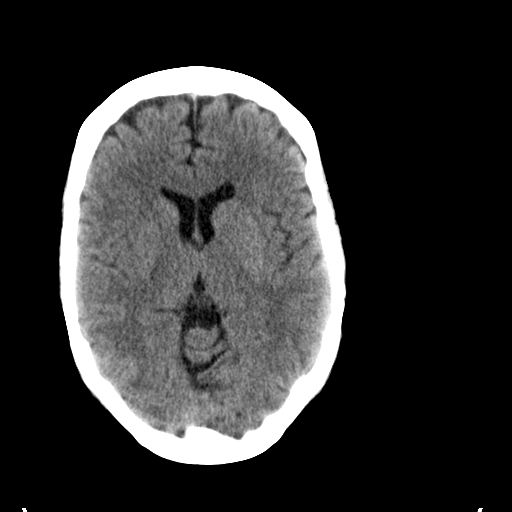
[im 15/32  brain]
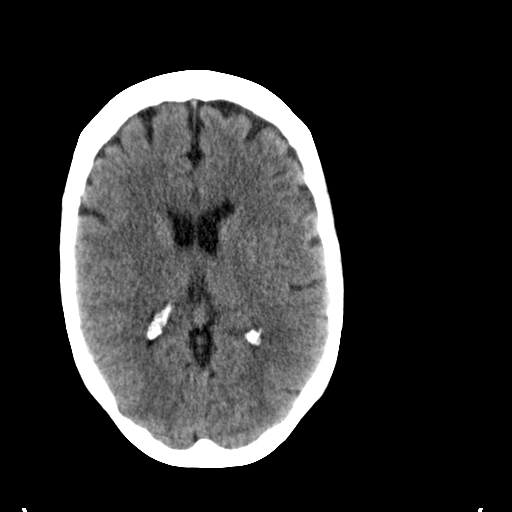
[im 17/32  brain]
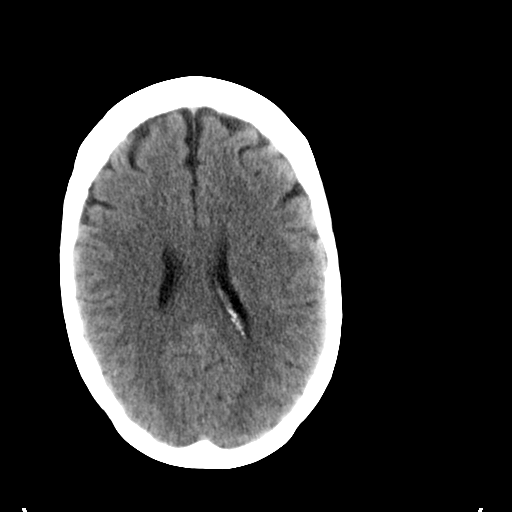
[im 17/32  bone]
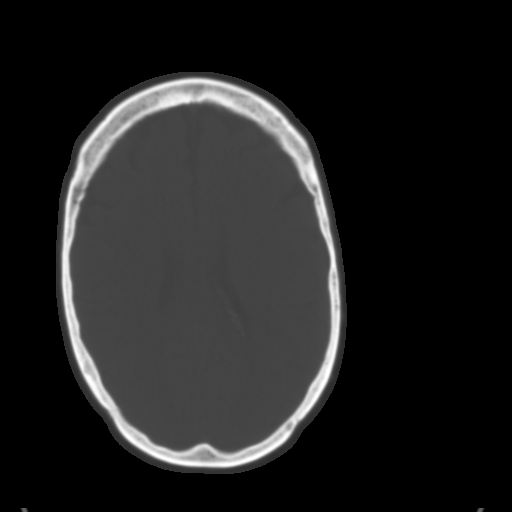
[im 19/32  brain]
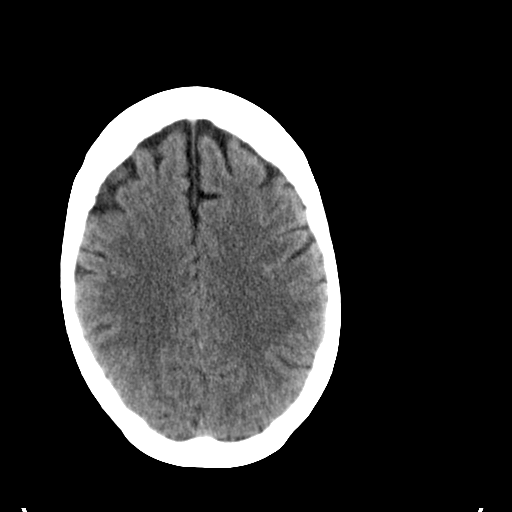
[im 21/32  brain]
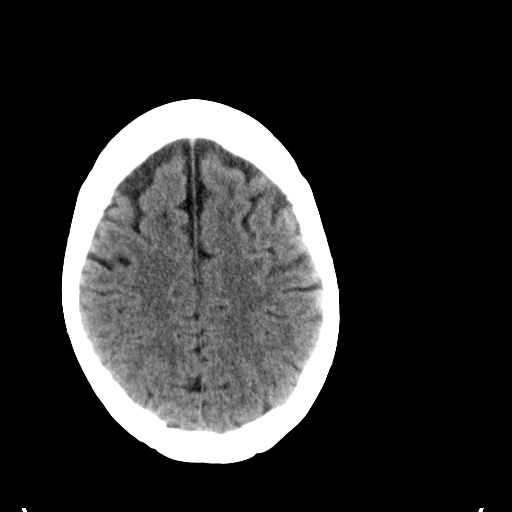
[im 23/32  brain]
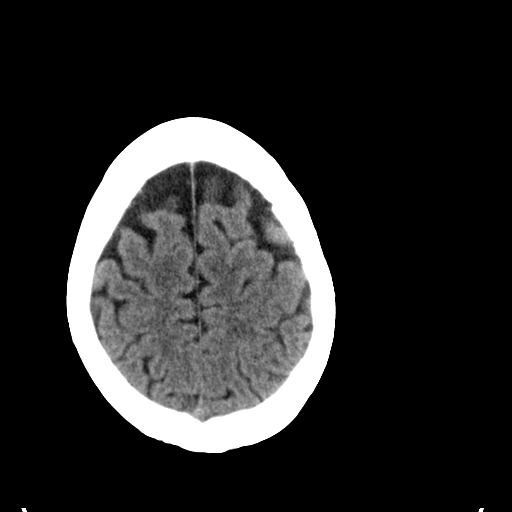
[im 24/32  brain]
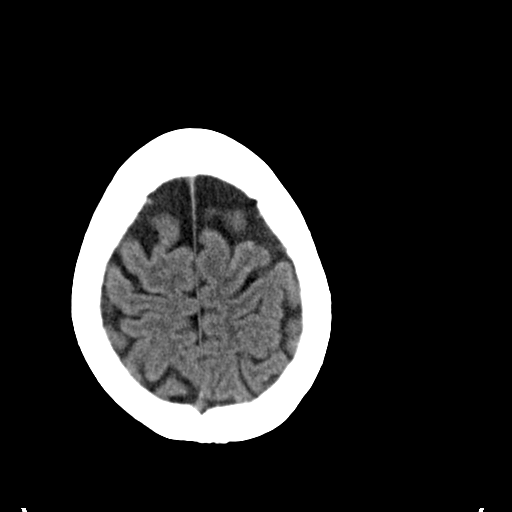
[im 24/32  bone]
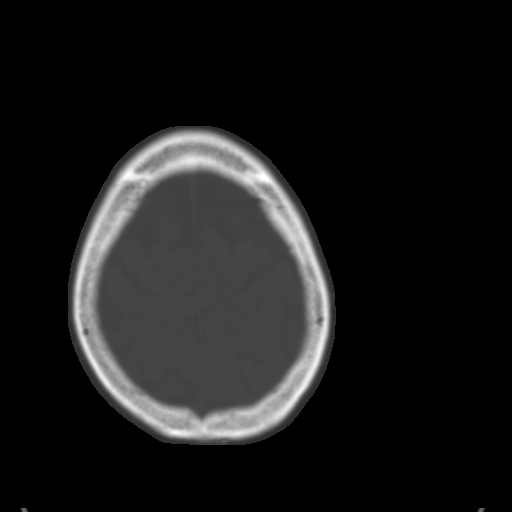
[im 26/32  brain]
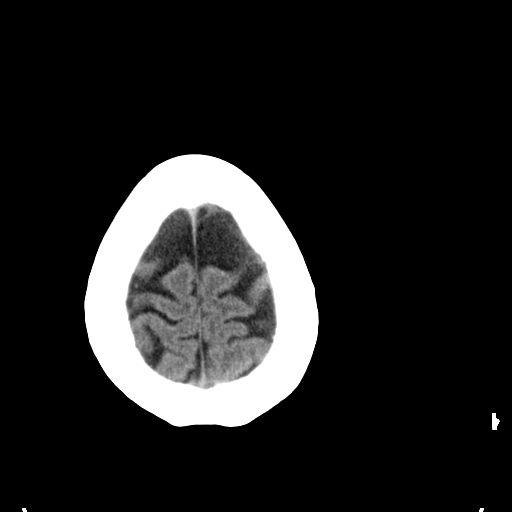
[im 28/32  brain]
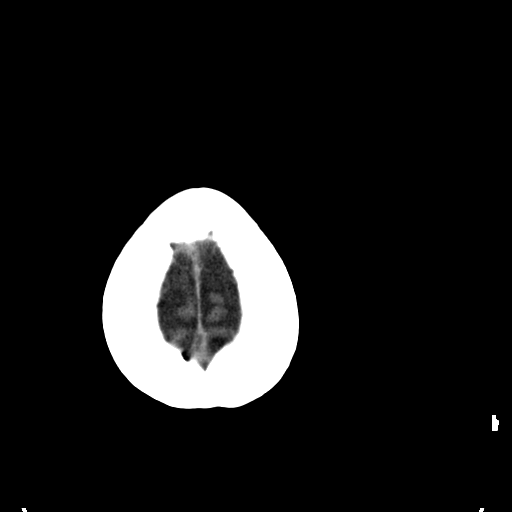
[im 30/32  brain]
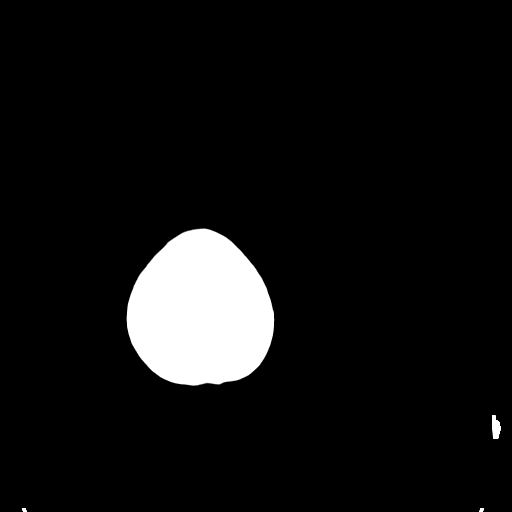

[16 of 30 positions shown; findings below may reference images not displayed]

FINDINGS: Mild generalized atrophy.

Normal ventricular morphology.

No midline shift or mass effect.

Otherwise normal appearance of brain parenchyma.

No intracranial hemorrhage, mass lesion, or acute infarction.

Visualized paranasal sinuses clear.

Bones unremarkable.
IMPRESSION: Generalized atrophy.

No acute intracranial abnormalities.

## 2017-02-04 DIAGNOSIS — J449 Chronic obstructive pulmonary disease, unspecified: Secondary | ICD-10-CM | POA: Diagnosis not present

## 2017-02-05 ENCOUNTER — Ambulatory Visit: Payer: Self-pay | Admitting: Family Medicine

## 2017-02-08 ENCOUNTER — Other Ambulatory Visit: Payer: Self-pay | Admitting: Psychiatry

## 2017-02-11 ENCOUNTER — Other Ambulatory Visit: Payer: Self-pay | Admitting: Psychiatry

## 2017-02-13 ENCOUNTER — Ambulatory Visit (INDEPENDENT_AMBULATORY_CARE_PROVIDER_SITE_OTHER): Payer: Medicare Other | Admitting: Family Medicine

## 2017-02-13 ENCOUNTER — Encounter: Payer: Self-pay | Admitting: Family Medicine

## 2017-02-13 VITALS — BP 132/84 | HR 64 | Temp 97.6°F | Resp 16 | Wt 251.0 lb

## 2017-02-13 DIAGNOSIS — M47896 Other spondylosis, lumbar region: Secondary | ICD-10-CM | POA: Diagnosis not present

## 2017-02-13 DIAGNOSIS — R7303 Prediabetes: Secondary | ICD-10-CM | POA: Diagnosis not present

## 2017-02-13 DIAGNOSIS — M797 Fibromyalgia: Secondary | ICD-10-CM | POA: Diagnosis not present

## 2017-02-13 DIAGNOSIS — J449 Chronic obstructive pulmonary disease, unspecified: Secondary | ICD-10-CM

## 2017-02-13 DIAGNOSIS — R222 Localized swelling, mass and lump, trunk: Secondary | ICD-10-CM

## 2017-02-13 DIAGNOSIS — F1290 Cannabis use, unspecified, uncomplicated: Secondary | ICD-10-CM | POA: Diagnosis not present

## 2017-02-13 DIAGNOSIS — M1712 Unilateral primary osteoarthritis, left knee: Secondary | ICD-10-CM

## 2017-02-13 DIAGNOSIS — M47816 Spondylosis without myelopathy or radiculopathy, lumbar region: Secondary | ICD-10-CM

## 2017-02-13 MED ORDER — PEN NEEDLES 31G X 6 MM MISC: 0 refills | Status: DC

## 2017-02-13 MED ORDER — BARIATRIC ROLLATOR MISC: 0 refills | Status: DC

## 2017-02-13 MED ORDER — LIRAGLUTIDE -WEIGHT MANAGEMENT 18 MG/3ML ~~LOC~~ SOPN: 0.6000 mg | PEN_INJECTOR | Freq: Every day | SUBCUTANEOUS | 0 refills | Status: DC

## 2017-02-13 MED ORDER — UMECLIDINIUM-VILANTEROL 62.5-25 MCG/INH IN AEPB: 1.0000 | INHALATION_SPRAY | Freq: Every day | RESPIRATORY_TRACT | 11 refills | Status: DC

## 2017-02-13 NOTE — Assessment & Plan Note (Signed)
Last A1c was controlled in August (5.5); check again in February or March

## 2017-02-13 NOTE — Assessment & Plan Note (Signed)
Okay for rolling walker with four wheels and seat

## 2017-02-13 NOTE — Patient Instructions (Addendum)
Please do talk with the family members you live with and try to either quit smoking together or at the very least smoke outdoors Consider hyponosis One place is [facility] Address: [address] [place], [place], [place] [address]  Phone: [phone]  I do encourage you to quit smoking Call [phone] to sign up for smoking cessation classes You can call 1-800-QUIT-NOW to talk with a smoking cessation coach  Stop the Spiriva Start the Anoro  Return in February for fasting labs and visit  Check out the information at familydoctor.org entitled "Nutrition for Weight Loss: What You Need to Know about Fad Diets" Try to lose between 1-2 pounds per week by taking in fewer calories and burning off more calories You can succeed by limiting portions, limiting foods dense in calories and fat, becoming more active, and drinking 8 glasses of water a day (64 ounces) Don't skip meals, especially breakfast, as skipping meals may alter your metabolism Do not use over-the-counter weight loss pills or gimmicks that claim rapid weight loss A healthy BMI (or body mass index) is between 18.5 and 24.9 You can calculate your ideal BMI at the [place] website ClubMonetize.fr  Start the new Saxenda for weight loss; call with any problems in the meantime  We'll have you see a surgeon for the spot on your chest  Steps to Quit Smoking Smoking tobacco can be bad for your health. It can also affect almost every organ in your body. Smoking puts you and people around you at risk for many serious long-lasting (chronic) diseases. Quitting smoking is hard, but it is one of the best things that you can do for your health. It is never too late to quit. What are the benefits of quitting smoking? When you quit smoking, you lower your risk for getting serious diseases and conditions. They can include:  Lung cancer or lung disease.  Heart disease.  Stroke.  Heart  attack.  Not being able to have children (infertility).  Weak bones (osteoporosis) and broken bones (fractures).  If you have coughing, wheezing, and shortness of breath, those symptoms may get better when you quit. You may also get sick less often. If you are pregnant, quitting smoking can help to lower your chances of having a baby of low birth weight. What can I do to help me quit smoking? Talk with your doctor about what can help you quit smoking. Some things you can do (strategies) include:  Quitting smoking totally, instead of slowly cutting back how much you smoke over a period of time.  Going to in-person counseling. You are more likely to quit if you go to many counseling sessions.  Using resources and support systems, such as: ? Database administrator with a Social worker. ? Phone quitlines. ? Careers information officer. ? Support groups or group counseling. ? Text messaging programs. ? Mobile phone apps or applications.  Taking medicines. Some of these medicines may have nicotine in them. If you are pregnant or breastfeeding, do not take any medicines to quit smoking unless your doctor says it is okay. Talk with your doctor about counseling or other things that can help you.  Talk with your doctor about using more than one strategy at the same time, such as taking medicines while you are also going to in-person counseling. This can help make quitting easier. What things can I do to make it easier to quit? Quitting smoking might feel very hard at first, but there is a lot that you can  do to make it easier. Take these steps:  Talk to your family and friends. Ask them to support and encourage you.  Call phone quitlines, reach out to support groups, or work with a Social worker.  Ask people who smoke to not smoke around you.  Avoid places that make you want (trigger) to smoke, such as: ? Bars. ? Parties. ? Smoke-break areas at work.  Spend time with people who do not smoke.  Lower the  stress in your life. Stress can make you want to smoke. Try these things to help your stress: ? Getting regular exercise. ? Deep-breathing exercises. ? Yoga. ? Meditating. ? Doing a body scan. To do this, close your eyes, focus on one area of your body at a time from head to toe, and notice which parts of your body are tense. Try to relax the muscles in those areas.  Download or buy apps on your mobile phone or tablet that can help you stick to your quit plan. There are many free apps, such as QuitGuide from the [employer] Office manager for Disease Control and Prevention). You can find more support from smokefree.gov and other websites.  This information is not intended to replace advice given to you by your health care provider. Make sure you discuss any questions you have with your health care provider. Document Released: 12/09/2008 Document Revised: 10/11/2015 Document Reviewed: 06/29/2014 Elsevier Interactive Patient Education  2018 [employer].

## 2017-02-13 NOTE — Progress Notes (Signed)
BP 132/84   Pulse 64   Temp 97.6 F (36.4 C) (Oral)   Resp 16   Wt 251 lb (113.9 kg)   LMP  (LMP Unknown)   SpO2 93%   BMI 47.43 kg/m    Subjective:    Patient ID: Nicole Austin, female    DOB: 1960/12/27, 56 y.o.   MRN: [id number]  HPI: Nicole Austin is a 56 y.o. female  Chief Complaint  Patient presents with  . Follow-up    HPI Patient is here for follow-up She has COPD; "very very difficult" It's hard when she has to do her spiriva inhaler She has not seen a pulmonologist  She continues to smoke She smokes to relieve stress; she has to have it She cannot take Chantix Smoking 1 ppd, sometimes more Brother-in-law and sister smoke, everybody in the house smokes; living together Patient is not comfortable asking her sister to smoke outside; sister does not smoke as much as she does Brother-in-law smokes as much as patient does We talked about relieving stress Seeing psychiatrist, Dr. Carlos Levering Su; back with him now; very glad  She had a rollator, walker and could get more exercise with the other walker Back pain and leg pain; she has chronic back pain; she had the total knee replacement Hip arthritis Xray from May 2018 of the right knee shows OA Chest CT from Jan 2018 showed degenerative changes of the spine  Obesity; stable even with the holidays and limited ability to walk; buys bottled water No hx of MTC; no fam hx of MEN-2 Would be comfortable doing an injection, has done injections in the past to prevent blood clots No hx of pancreatitis  Prediabetes in the chart; she was much heavier then; last A1c was excellent Lab Results  Component Value Date   HGBA1C 5.5 10/16/2016    Depression screen [facility] 2/9 02/13/2017 01/10/2017 10/16/2016 10/04/2016 05/07/2016  Decreased Interest 3 0 2 3 0  Down, Depressed, Hopeless 3 1 2 3 1   PHQ - 2 Score 6 1 4 6 1   Altered sleeping 3 - 3 3 -  Tired, decreased energy 3 - 3 3 -  Change in appetite 3 - 1 3 -  Feeling  bad or failure about yourself  3 - 2 3 -  Trouble concentrating 3 - 2 3 -  Moving slowly or fidgety/restless 1 - 2 1 -  Suicidal thoughts 0 - 0 3 -  PHQ-9 Score 22 - 17 25 -  Difficult doing work/chores Very difficult - Not difficult at all Extremely dIfficult -  Some recent data might be hidden  seeing her psychiatrist, Dr. Carlos Levering Su  Relevant past medical, surgical, family and social history reviewed Past Medical History:  Diagnosis Date  . ADHD (attention deficit hyperactivity disorder)   . Arthritis   . Asthma   . Bipolar 1 disorder ([place])   . COPD (chronic obstructive pulmonary disease) ([place])   . Depression   . GERD (gastroesophageal reflux disease) 08/30/2015  . History of acute myocardial infarction 06/30/2015   Overview:  [facility]   . Low HDL (under 40) 08/30/2015  . MI (myocardial infarction) ([place])   . Prediabetes 05/20/2016  . PTSD (post-traumatic stress disorder)   . Rhabdomyolysis   . Stroke [facility])    Past Surgical History:  Procedure Laterality Date  . BACK SURGERY    . REPLACEMENT TOTAL KNEE Left    Family History  Problem Relation Age of Onset  . Hernia Mother   .  Heart disease Mother   . OCD Mother   . Diabetes Mother   . Parkinson's disease Father   . Bipolar disorder Sister   . Schizophrenia Sister   . ADD / ADHD Sister   . Alcohol abuse Brother   . Bipolar disorder Sister   . Paranoid behavior Sister   . ADD / ADHD Sister   . ADD / ADHD Son   . Dementia Maternal Grandmother   . Emphysema Maternal Grandfather   . ADD / ADHD Son   . ADD / ADHD Son   . Depression Son    Social History   Tobacco Use  . Smoking status: Current Every Day Smoker    Packs/day: 1.00    Years: 36.00    Pack years: 36.00    Types: Cigarettes    Start date: 10/11/1984  . Smokeless tobacco: Never Used  . Tobacco comment: Rx for Nioderm  Substance Use Topics  . Alcohol use: No    Alcohol/week: 0.0 oz  . Drug use: No    Interim medical history since last visit  reviewed. Allergies and medications reviewed  Review of Systems Per HPI unless specifically indicated above     Objective:    BP 132/84   Pulse 64   Temp 97.6 F (36.4 C) (Oral)   Resp 16   Wt 251 lb (113.9 kg)   LMP  (LMP Unknown)   SpO2 93%   BMI 47.43 kg/m   Wt Readings from Last 3 Encounters:  02/13/17 251 lb (113.9 kg)  01/10/17 251 lb (113.9 kg)  01/10/17 251 lb 6.4 oz (114 kg)    Physical Exam  Constitutional: She appears well-developed and well-nourished. No distress.  HENT:  Head: Normocephalic and atraumatic.  Eyes: EOM are normal. No scleral icterus.  Neck: No thyromegaly present.  Cardiovascular: Normal rate, regular rhythm and normal heart sounds.  No murmur heard. Pulmonary/Chest: Effort normal and breath sounds normal. No respiratory distress. She has no wheezes.    Soft palpable mass upper chest wall; no overlying skin changes, slightly irregular, consistency of lipoma, perhaps 4 cm in greatest diameter  Abdominal: Soft. Bowel sounds are normal. She exhibits no distension.  Musculoskeletal: Normal range of motion. She exhibits no edema.  Neurological: She is alert. She exhibits normal muscle tone.  Skin: Skin is warm and dry. She is not diaphoretic. No pallor.  Psychiatric: She has a normal mood and affect. Her behavior is normal. Her affect is not blunt. She is not agitated and not slowed.  Good eye contact with examiner; briefly tearful when discussing her lung function, but otherwise full range of affect, smiling, conversant, forward-thinking    Results for orders placed or performed during the hospital encounter of 01/10/17  Troponin I  Result Value Ref Range   Troponin I <0.03 <0.03 ng/mL  Comprehensive metabolic panel  Result Value Ref Range   Sodium 135 135 - 145 mmol/L   Potassium 4.3 3.5 - 5.1 mmol/L   Chloride 98 (L) 101 - 111 mmol/L   CO2 23 22 - 32 mmol/L   Glucose, Bld 109 (H) 65 - 99 mg/dL   BUN 15 6 - 20 mg/dL   Creatinine, Ser  1.00 0.44 - 1.00 mg/dL   Calcium 9.1 8.9 - 10.3 mg/dL   Total Protein 7.3 6.5 - 8.1 g/dL   Albumin 3.9 3.5 - 5.0 g/dL   AST 25 15 - 41 U/L   ALT 18 14 - 54 U/L   Alkaline Phosphatase  94 38 - 126 U/L   Total Bilirubin 0.7 0.3 - 1.2 mg/dL   GFR calc non Af Amer >60 >60 mL/min   GFR calc Af Amer >60 >60 mL/min   Anion gap 14 5 - 15  Ethanol  Result Value Ref Range   Alcohol, Ethyl (B) <10 <10 mg/dL  cbc  Result Value Ref Range   WBC 13.9 (H) 3.6 - 11.0 K/uL   RBC 4.67 3.80 - 5.20 MIL/uL   Hemoglobin 14.5 12.0 - 16.0 g/dL   HCT 43.0 35.0 - 47.0 %   MCV 92.2 80.0 - 100.0 fL   MCH 31.1 26.0 - 34.0 pg   MCHC 33.7 32.0 - 36.0 g/dL   RDW 13.8 11.5 - 14.5 %   Platelets 276 150 - 440 K/uL  Urine Drug Screen, Qualitative  Result Value Ref Range   Tricyclic, Ur Screen NONE DETECTED NONE DETECTED   Amphetamines, Ur Screen NONE DETECTED NONE DETECTED   MDMA (Ecstasy)Ur Screen NONE DETECTED NONE DETECTED   Cocaine Metabolite,Ur [place] NONE DETECTED NONE DETECTED   Opiate, Ur Screen NONE DETECTED NONE DETECTED   Phencyclidine (PCP) Ur S NONE DETECTED NONE DETECTED   Cannabinoid 50 Ng, Ur [place] POSITIVE (A) NONE DETECTED   Barbiturates, Ur Screen NONE DETECTED NONE DETECTED   Benzodiazepine, Ur Scrn POSITIVE (A) NONE DETECTED   Methadone Scn, Ur NONE DETECTED NONE DETECTED  Urinalysis, Complete w Microscopic  Result Value Ref Range   Color, Urine STRAW (A) YELLOW   APPearance CLEAR (A) CLEAR   Specific Gravity, Urine 1.006 1.005 - 1.030   pH 7.0 5.0 - 8.0   Glucose, UA NEGATIVE NEGATIVE mg/dL   Hgb urine dipstick NEGATIVE NEGATIVE   Bilirubin Urine NEGATIVE NEGATIVE   Ketones, ur NEGATIVE NEGATIVE mg/dL   Protein, ur NEGATIVE NEGATIVE mg/dL   Nitrite NEGATIVE NEGATIVE   Leukocytes, UA NEGATIVE NEGATIVE   RBC / HPF NONE SEEN 0 - 5 RBC/hpf   WBC, UA 0-5 0 - 5 WBC/hpf   Bacteria, UA NONE SEEN NONE SEEN   Squamous Epithelial / LPF 0-5 (A) NONE SEEN      Assessment & Plan:   Problem List  Items Addressed This Visit      Respiratory   Stage 3 severe COPD by GOLD classification ([place]) - Primary    Showed patient Fletcher-Peto curve, today's spirometry Okay the rolling walker; gives out of breath if walking far; she requests a seat for resting when needed      Relevant Medications   umeclidinium-vilanterol (ANORO ELLIPTA) 62.5-25 MCG/INH AEPB     Musculoskeletal and Integument   Facet hypertrophy of lumbar region    Okay for rolling walker with four wheels and seat      Arthritis of knee, degenerative (Chronic)    Okay for rolling walker with four wheels and seat        Other   Prediabetes    Last A1c was controlled in August (5.5); check again in February or March      Morbid obesity ([place])    Encouraged weight loss      Relevant Medications   Liraglutide -Weight Management (SAXENDA) 18 MG/3ML SOPN   Marijuana use    Cautioned about recent rise in accidental fentanyl overdoses, marijuana can be laced with other drugs; use extreme caution because of risk of accidental overdose and death      Fibromyalgia    Limits her ability to exercise, in addition to her morbid obesity and COPD  Other Visit Diagnoses    Mass of chest wall       Relevant Orders   Ambulatory referral to General Surgery      Follow up plan: Return in about 2 months (around 04/19/2017) for twenty minute follow-up with fasting labs, or just after.  An after-visit summary was printed and given to the patient at [place].  Please see the patient instructions which may contain other information and recommendations beyond what is mentioned above in the assessment and plan.  Meds ordered this encounter  Medications  . Misc. Devices (BARIATRIC ROLLATOR) MISC    Sig: For use when walking; dx: degenerative changes of spine, OA of knee; morbid obesity, COPD    Dispense:  1 each    Refill:  0  . umeclidinium-vilanterol (ANORO ELLIPTA) 62.5-25 MCG/INH AEPB    Sig: Inhale 1 puff into the  lungs daily. (this replaces the spiriva)    Dispense:  1 each    Refill:  11  . Liraglutide -Weight Management (SAXENDA) 18 MG/3ML SOPN    Sig: Inject 0.6 mg into the skin daily. x 1 week, then 1.2 mg daily x 1 week, then 1.8 mg daily x 1 week, then 2.4 mg daily x 1 week, then 3 mg daily    Dispense:  3 mL    Refill:  0  . Insulin Pen Needle (PEN NEEDLES) 31G X 6 MM MISC    Sig: To use with Saxenda for daily injections subcutaneously    Dispense:  100 each    Refill:  0    Orders Placed This Encounter  Procedures  . Ambulatory referral to General Surgery  . Spirometry with Graph

## 2017-02-13 NOTE — Assessment & Plan Note (Signed)
Encouraged weight loss 

## 2017-02-13 NOTE — Assessment & Plan Note (Addendum)
Showed patient Fletcher-Peto curve, today's spirometry Okay the rolling walker; gives out of breath if walking far; she requests a seat for resting when needed

## 2017-02-13 NOTE — Assessment & Plan Note (Signed)
Limits her ability to exercise, in addition to her morbid obesity and COPD

## 2017-02-13 NOTE — Assessment & Plan Note (Signed)
Cautioned about recent rise in accidental fentanyl overdoses, marijuana can be laced with other drugs; use extreme caution because of risk of accidental overdose and death

## 2017-02-25 DIAGNOSIS — G8929 Other chronic pain: Secondary | ICD-10-CM | POA: Insufficient documentation

## 2017-02-25 DIAGNOSIS — F319 Bipolar disorder, unspecified: Secondary | ICD-10-CM | POA: Insufficient documentation

## 2017-02-25 DIAGNOSIS — M549 Dorsalgia, unspecified: Secondary | ICD-10-CM

## 2017-02-25 DIAGNOSIS — F988 Other specified behavioral and emotional disorders with onset usually occurring in childhood and adolescence: Secondary | ICD-10-CM | POA: Insufficient documentation

## 2017-02-25 DIAGNOSIS — B019 Varicella without complication: Secondary | ICD-10-CM

## 2017-02-25 DIAGNOSIS — Z8719 Personal history of other diseases of the digestive system: Secondary | ICD-10-CM | POA: Insufficient documentation

## 2017-02-25 HISTORY — DX: Varicella without complication: B01.9

## 2017-03-01 ENCOUNTER — Ambulatory Visit: Payer: Self-pay | Admitting: Cardiothoracic Surgery

## 2017-03-07 DIAGNOSIS — J449 Chronic obstructive pulmonary disease, unspecified: Secondary | ICD-10-CM | POA: Diagnosis not present

## 2017-03-08 ENCOUNTER — Ambulatory Visit: Payer: Self-pay | Admitting: Surgery

## 2017-03-13 IMAGING — CT CT CERVICAL SPINE WITHOUT CONTRAST
4 series · 16 of 33 positions shown, 19 images · non-contrast
Comparison: Cervical MRI 04/18/2014

CLINICAL DATA: Fall today.  Neck pain

EXAM:
CT CERVICAL SPINE WITHOUT CONTRAST
TECHNIQUE: Multidetector CT imaging of the cervical spine was performed without
intravenous contrast. Multiplanar CT image reconstructions were also
generated.

[Series 3: c spine soft · axial · 0.33mm/px · z∈[-380,-324]mm · 3 of 83 slices shown]
[im 14/83  soft-tissue]
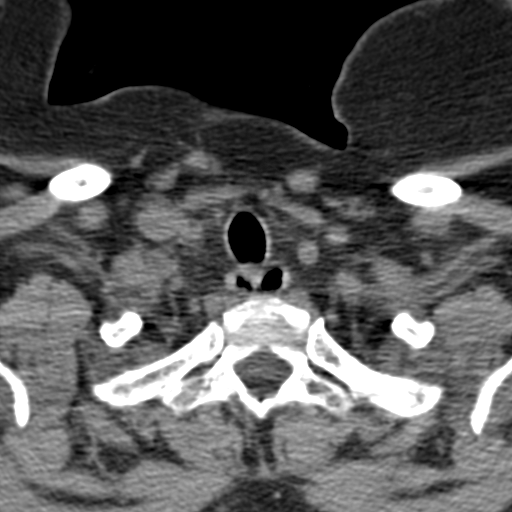
[im 28/83  soft-tissue]
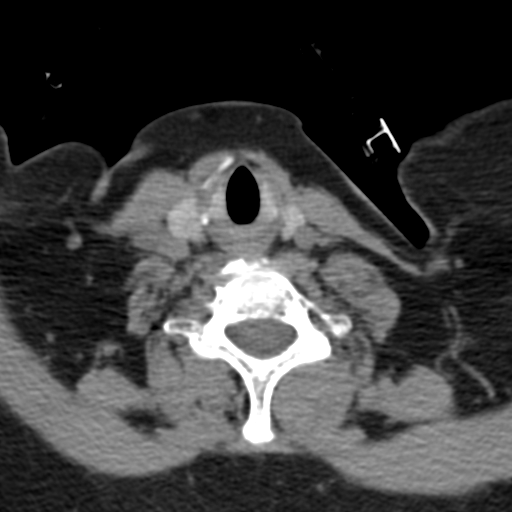
[im 42/83  soft-tissue]
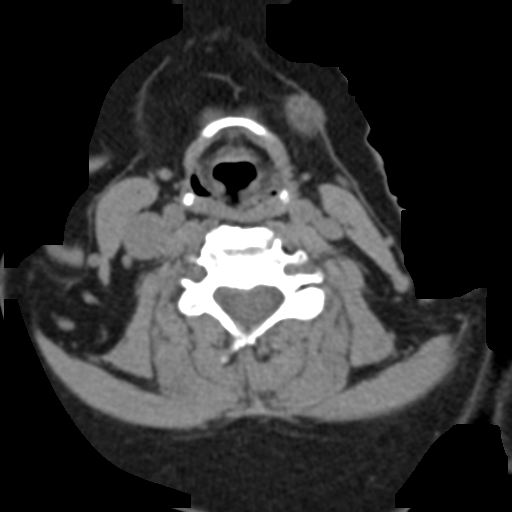

[Series 4: sag bone · sagittal · 0.28mm/px · 5 of 40 slices shown, 6 images]
[im 14/40  bone]
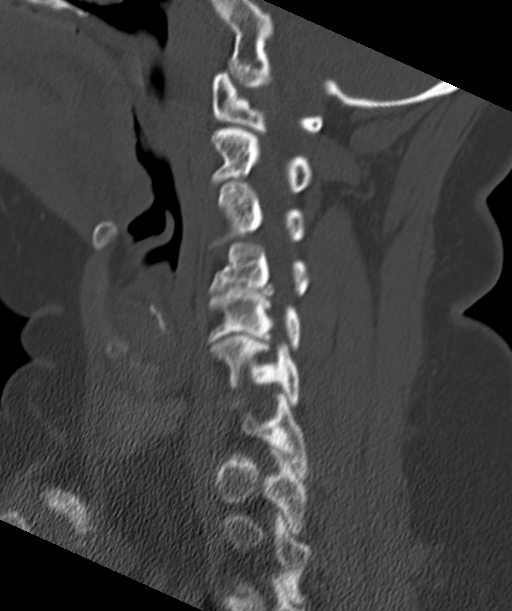
[im 17/40  bone]
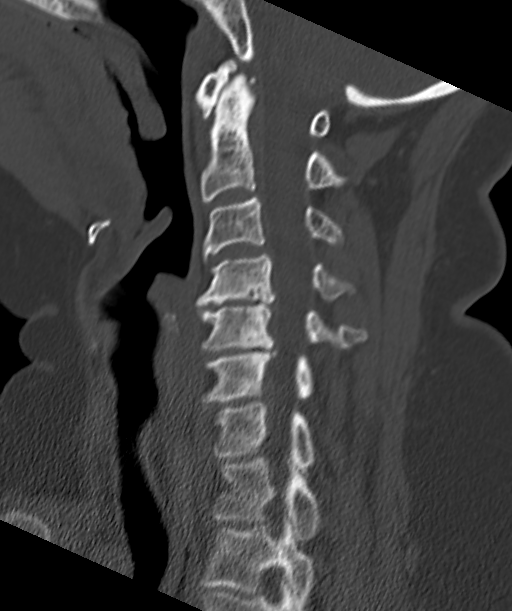
[im 20/40  soft-tissue]
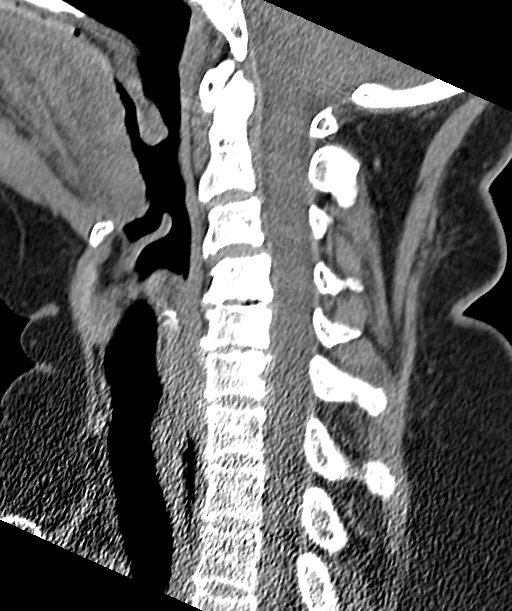
[im 20/40  bone]
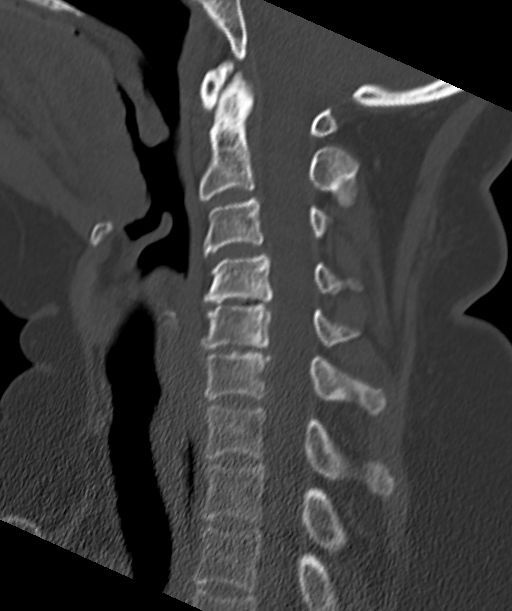
[im 23/40  bone]
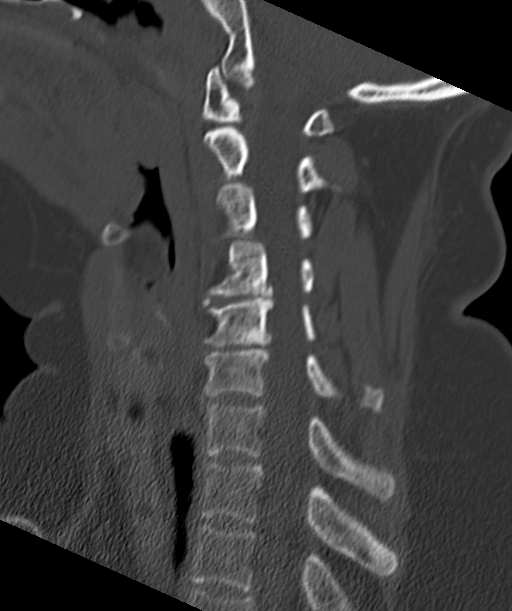
[im 27/40  bone]
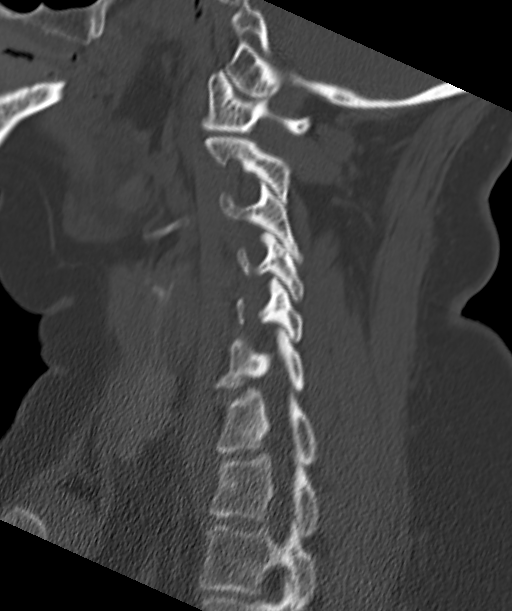

[Series 5: cor bone · coronal · 0.35mm/px · 3 of 43 slices shown]
[im 9/43  bone]
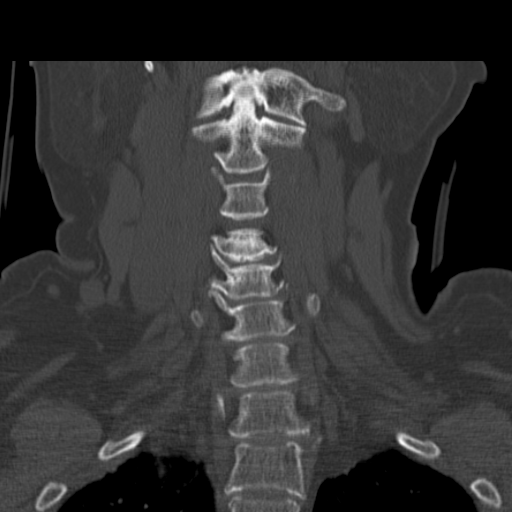
[im 17/43  bone]
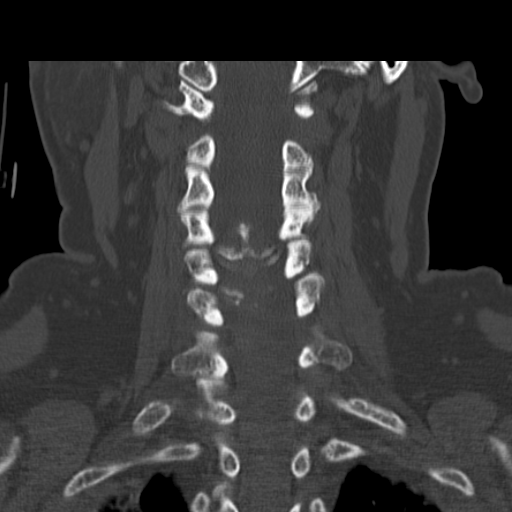
[im 26/43  bone]
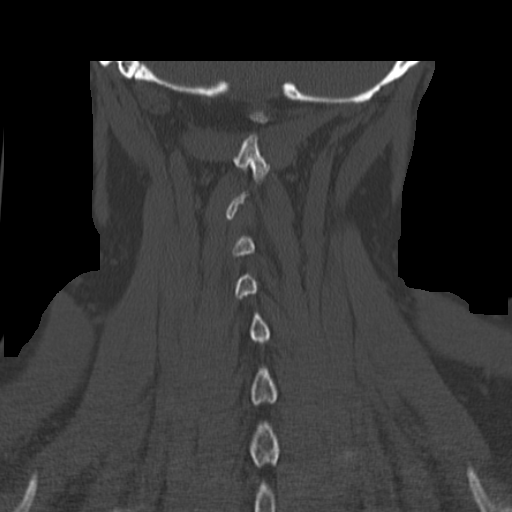

[Series 6: orthogonal axials · axial · 0.29mm/px · z∈[-407,-305]mm · 5 of 85 slices shown, 7 images]
[im 15/85  soft-tissue]
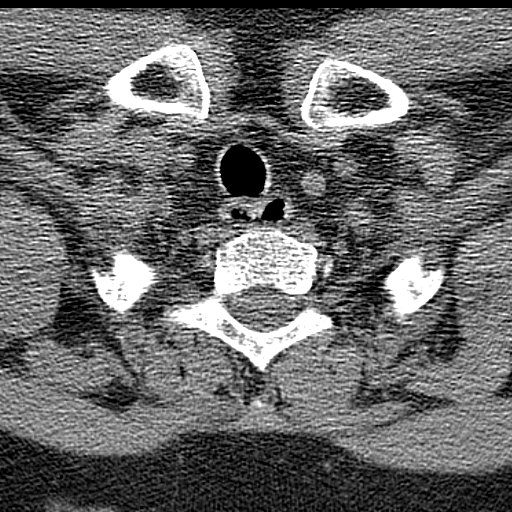
[im 15/85  bone]
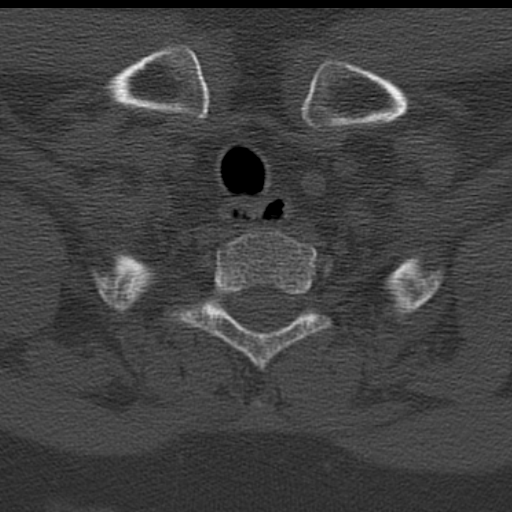
[im 29/85  bone]
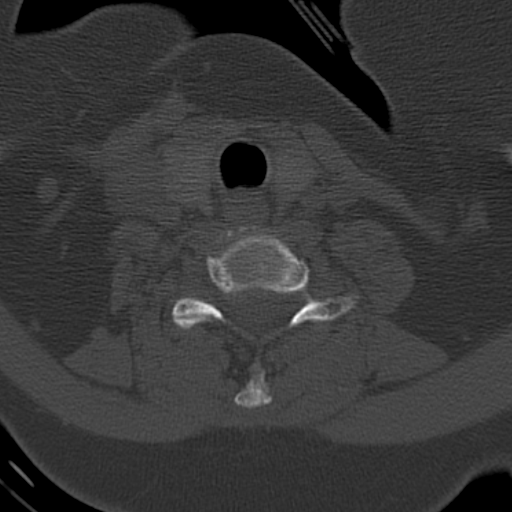
[im 43/85  bone]
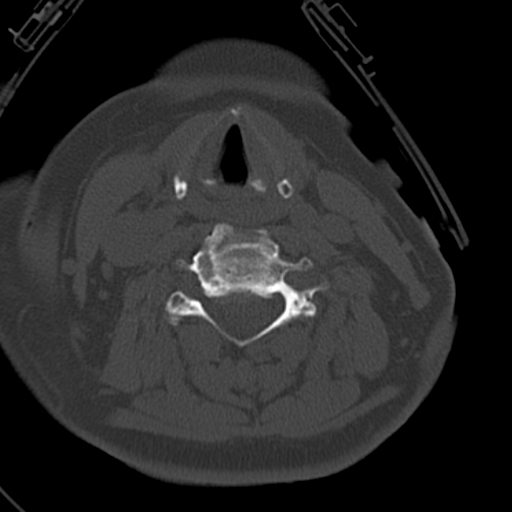
[im 57/85  bone]
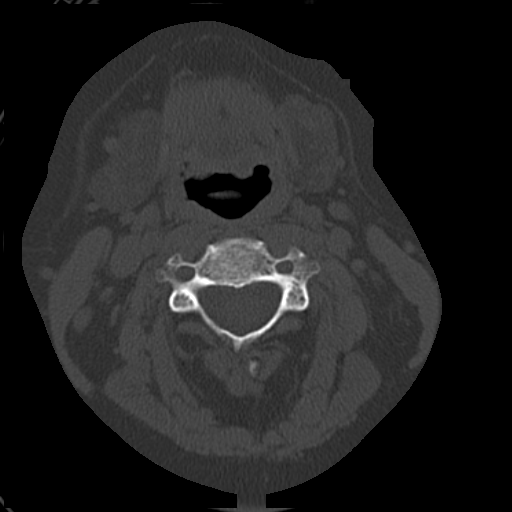
[im 71/85  soft-tissue]
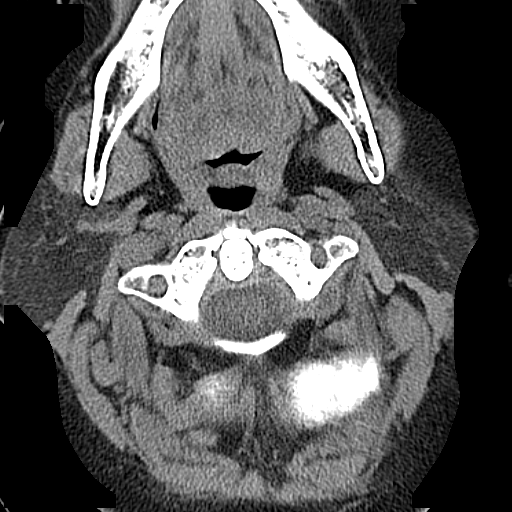
[im 71/85  bone]
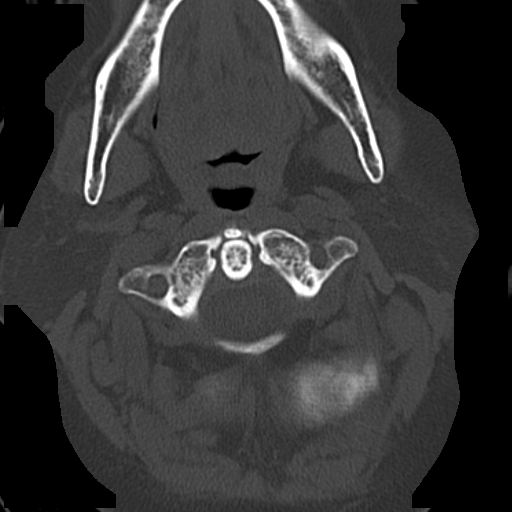

[16 of 33 positions shown; findings below may reference images not displayed]

FINDINGS: Mild cervical kyphosis. Mild anterior slip C3-4 unchanged from the
prior study. Moderate disc degeneration and spondylosis C4-5 and
C5-6. Foraminal narrowing bilaterally C4-5. Right foraminal
narrowing C5-6 secondary to spurring. Mild disc degeneration C6-7.

Negative for fracture. No acute bony change. No soft tissue mass or
adenopathy.
IMPRESSION: Cervical spondylosis.  Negative for fracture.

## 2017-03-14 ENCOUNTER — Ambulatory Visit: Payer: Self-pay | Admitting: Surgery

## 2017-03-15 ENCOUNTER — Telehealth: Payer: Self-pay | Admitting: General Practice

## 2017-03-15 NOTE — Telephone Encounter (Signed)
I have left a message for the patient to call the office patient no showed appointment 03/14/17. Please r/s if patient calls back.

## 2017-03-19 NOTE — Telephone Encounter (Signed)
Patient had a death in the family and asked if we could call her back in about a week.

## 2017-04-01 ENCOUNTER — Encounter: Payer: Self-pay | Admitting: Surgery

## 2017-04-01 ENCOUNTER — Ambulatory Visit (INDEPENDENT_AMBULATORY_CARE_PROVIDER_SITE_OTHER): Payer: Medicare Other | Admitting: Surgery

## 2017-04-01 VITALS — BP 122/81 | HR 77 | Temp 98.1°F | Ht 61.0 in | Wt 245.6 lb

## 2017-04-01 DIAGNOSIS — L821 Other seborrheic keratosis: Secondary | ICD-10-CM

## 2017-04-01 NOTE — Patient Instructions (Signed)
We will send the referral to the Dermatologist. Someone from their office will contact you to schedule an appointment.  If you have not heard from their office within 5-7 days please call our office so we can check on this for you.

## 2017-04-01 NOTE — Progress Notes (Signed)
Nicole Austin is an 57 y.o. female.   Chief Complaint: Chest wall mass HPI: This a patient with a chest wall mass.  She actually points to an area over her manubrium stating that is been there for 2 years and is growing slightly and is discolored.  She states that she used to do a lot of tanning both tanning beds and sitting in the sun and has a family history of skin cancer.  This does not cause her much pain except when she pushes on it  Past Medical History:  Diagnosis Date  . ADHD (attention deficit hyperactivity disorder)   . Apnea, sleep 06/30/2015  . Arthritis   . Asthma   . Asthma with acute exacerbation 06/30/2015  . Bipolar 1 disorder ([place])   . COPD (chronic obstructive pulmonary disease) ([place])   . COPD, moderate ([place]) 11/18/2013   Overview:  Last Assessment & Plan:  Refer to pulmonologist Overview:  Overview:  Last Assessment & Plan:  Pneumonia vaccine offered and given today; she did not tolerate LABA; will start her on Spiriva; stop combivent neb; use only SABA neb OR inhaler, not both; smoking cessation urged, and I am here to help if she wants assistance quitting; f/u in 4 weeks Last Assessment & Plan:  Pneumonia vaccin  . Depression   . GERD (gastroesophageal reflux disease) 08/30/2015  . History of acute myocardial infarction 06/30/2015   Overview:  [facility]   . Low HDL (under 40) 08/30/2015  . MI (myocardial infarction) ([place])   . Prediabetes 05/20/2016  . PTSD (post-traumatic stress disorder)   . Rhabdomyolysis   . Sleep apnea 06/30/2015  . Stage 3 severe COPD by GOLD classification ([place]) 11/18/2013  . Stroke ([place])   . Uncomplicated asthma 05/04/1827  . Varicella 02/25/2017    Past Surgical History:  Procedure Laterality Date  . BACK SURGERY    . REPLACEMENT TOTAL KNEE Left     Family History  Problem Relation Age of Onset  . Hernia Mother   . Heart disease Mother   . OCD Mother   . Diabetes Mother   . Parkinson's disease Father   . Bipolar disorder Sister   .  Schizophrenia Sister   . ADD / ADHD Sister   . Alcohol abuse Brother   . Bipolar disorder Sister   . Paranoid behavior Sister   . ADD / ADHD Sister   . ADD / ADHD Son   . Dementia Maternal Grandmother   . Emphysema Maternal Grandfather   . ADD / ADHD Son   . ADD / ADHD Son   . Depression Son    Social History:  reports that she has been smoking cigarettes.  She started smoking about 32 years ago. She has a 36.00 pack-year smoking history. she has never used smokeless tobacco. She reports that she does not drink alcohol or use drugs.  Allergies:  Allergies  Allergen Reactions  . Chantix [Varenicline] Nausea Only  . Codeine Nausea And Vomiting     (Not in a hospital admission)   Review of Systems:   Review of Systems  Constitutional: Negative.   HENT: Negative.   Respiratory: Positive for shortness of breath.   Skin: Negative.     Physical Exam:  Physical Exam  Constitutional: She is well-developed, well-nourished, and in no distress. No distress.  Morbidly obese patient multiple piercings including her nose and tongue In general she has trouble completing long sentences without taking a long deep breath  HENT:  Head: Normocephalic  and atraumatic.  Pulmonary/Chest: Effort normal. No respiratory distress.  Fatty tissue over the manubrium with overlying seborrheic keratosis.  Multiple SKs over the chest.  No significant worrisome lesions.  Skin: She is not diaphoretic.  Vitals reviewed.   There were no vitals taken for this visit.    No results found for this or any previous visit (from the past 48 hour(s)). No results found.   Assessment/Plan Seborrheic keratosis over multiple areas of the anterior chest especially over the area where she points which is fatty tissue over the manubrium.  I would recommend that she see a dermatologist as she has a family history of skin cancer and extensive tanning history both in tanning beds and in the sun.  She continues to  smoke.  She has considerable lung disease as well.  She will follow-up with [place] on an as-needed basis dermatology consultation will be obtained  Florene Glen, MD, FACS

## 2017-04-02 ENCOUNTER — Telehealth: Payer: Self-pay | Admitting: Surgery

## 2017-04-02 NOTE — Telephone Encounter (Signed)
Patient has been referred to dermatology per Dr Burt Knack for seborrheic keratosis.   Referral has been faxed to [facility] in [place]. Fax: [phone] Phone: [phone].  Demographics, insurance and clinic notes have been faxed.   I spoke with Lelon Frohlich with the department and she stated that she will call the patient to make an appointment within 24 hrs of receiving the referral.

## 2017-04-03 ENCOUNTER — Telehealth: Payer: Self-pay | Admitting: Surgery

## 2017-04-03 NOTE — Telephone Encounter (Signed)
Referral was sent to [facility] per Dr Burt Knack for patient to be consulted for seborrheic keratosis.   Appt: 04/08/17 @ 2:30pm with Dr Phillip Heal.  Patient is aware of appointment.

## 2017-04-07 DIAGNOSIS — J449 Chronic obstructive pulmonary disease, unspecified: Secondary | ICD-10-CM | POA: Diagnosis not present

## 2017-04-19 ENCOUNTER — Ambulatory Visit (INDEPENDENT_AMBULATORY_CARE_PROVIDER_SITE_OTHER): Payer: Medicare Other | Admitting: Family Medicine

## 2017-04-19 ENCOUNTER — Encounter: Payer: Self-pay | Admitting: Family Medicine

## 2017-04-19 VITALS — BP 114/74 | HR 99 | Temp 98.3°F | Resp 18 | Wt 238.7 lb

## 2017-04-19 DIAGNOSIS — E782 Mixed hyperlipidemia: Secondary | ICD-10-CM | POA: Diagnosis not present

## 2017-04-19 DIAGNOSIS — R197 Diarrhea, unspecified: Secondary | ICD-10-CM

## 2017-04-19 DIAGNOSIS — R7303 Prediabetes: Secondary | ICD-10-CM

## 2017-04-19 DIAGNOSIS — B349 Viral infection, unspecified: Secondary | ICD-10-CM | POA: Diagnosis not present

## 2017-04-19 DIAGNOSIS — E786 Lipoprotein deficiency: Secondary | ICD-10-CM | POA: Diagnosis not present

## 2017-04-19 MED ORDER — PROMETHAZINE HCL 25 MG PO TABS: 25.0000 mg | ORAL_TABLET | Freq: Three times a day (TID) | ORAL | 0 refills | Status: DC | PRN

## 2017-04-19 NOTE — Progress Notes (Signed)
BP 114/74   Pulse 99   Temp 98.3 F (36.8 C) (Oral)   Resp 18   Wt 238 lb 11.2 oz (108.3 kg)   LMP  (LMP Unknown)   SpO2 94%   BMI 45.10 kg/m    Subjective:    Patient ID: Nicole Austin, female    DOB: Feb 08, 1961, 57 y.o.   MRN: [id number]  HPI: Nicole Austin is a 56 y.o. female  Chief Complaint  Patient presents with  . Follow-up  . URI    last friday symptoms include: headache, congestion, nausea, diarrhea and cough    HPI Patient is here for f/u, but also sick; she has headache, congestion, nausea, diarrhea, and cough; got sick last Friday (today is Friday); vomiting up phlegm and dry heaves; taking "everything" she could, "severe cold and chest medicine, nasal decongestant tablets, vicks vapor rub, nyquil, robitussin, tylenol, but just one thing one day, not mixing them altogether; no energy; not eating much; diarrhea is almost water, just a blow out; she does not have jaundice; abd pain, gnawing  Her mood is doing much better; trying to think positive; "[place] wasn't built in a day"; she is getting older and was really set with her own things for so long, then it spiraled, and now she is coming to accept her situation, can't do everything; more accepting, seeing the bright side of things  COPD; has the [place] but not using much, but will start back after the sickness  High cholesterol; on medicine; tries to watch what she eats  Depression screen [facility] 2/9 04/19/2017 02/13/2017 01/10/2017 10/16/2016 10/04/2016  Decreased Interest 0 3 0 2 3  Down, Depressed, Hopeless 1 3 1 2 3   PHQ - 2 Score 1 6 1 4 6   Altered sleeping - 3 - 3 3  Tired, decreased energy - 3 - 3 3  Change in appetite - 3 - 1 3  Feeling bad or failure about yourself  - 3 - 2 3  Trouble concentrating - 3 - 2 3  Moving slowly or fidgety/restless - 1 - 2 1  Suicidal thoughts - 0 - 0 3  PHQ-9 Score - 22 - 17 25  Difficult doing work/chores - Very difficult - Not difficult at all Extremely dIfficult    Some recent data might be hidden    Relevant past medical, surgical, family and social history reviewed Past Medical History:  Diagnosis Date  . ADHD (attention deficit hyperactivity disorder)   . Apnea, sleep 06/30/2015  . Arthritis   . Asthma   . Asthma with acute exacerbation 06/30/2015  . Bipolar 1 disorder ([place])   . COPD (chronic obstructive pulmonary disease) ([place])   . COPD, moderate ([place]) 11/18/2013   Overview:  Last Assessment & Plan:  Refer to pulmonologist Overview:  Overview:  Last Assessment & Plan:  Pneumonia vaccine offered and given today; she did not tolerate LABA; will start her on Spiriva; stop combivent neb; use only SABA neb OR inhaler, not both; smoking cessation urged, and I am here to help if she wants assistance quitting; f/u in 4 weeks Last Assessment & Plan:  Pneumonia vaccin  . Depression   . GERD (gastroesophageal reflux disease) 08/30/2015  . History of acute myocardial infarction 06/30/2015   Overview:  [facility]   . Low HDL (under 40) 08/30/2015  . MI (myocardial infarction) ([place])   . Prediabetes 05/20/2016  . PTSD (post-traumatic stress disorder)   . Rhabdomyolysis   . Sleep apnea 06/30/2015  .  Stage 3 severe COPD by GOLD classification ([place]) 11/18/2013  . Stroke ([place])   . Uncomplicated asthma 05/27/9620  . Varicella 02/25/2017   Past Surgical History:  Procedure Laterality Date  . BACK SURGERY    . REPLACEMENT TOTAL KNEE Left    Family History  Problem Relation Age of Onset  . Hernia Mother   . Heart disease Mother   . OCD Mother   . Diabetes Mother   . Parkinson's disease Father   . Bipolar disorder Sister   . Schizophrenia Sister   . ADD / ADHD Sister   . Alcohol abuse Brother   . Bipolar disorder Sister   . Paranoid behavior Sister   . ADD / ADHD Sister   . ADD / ADHD Son   . Dementia Maternal Grandmother   . Emphysema Maternal Grandfather   . ADD / ADHD Son   . ADD / ADHD Son   . Depression Son    Social History   Tobacco Use  . Smoking  status: Current Every Day Smoker    Packs/day: 1.00    Years: 36.00    Pack years: 36.00    Types: Cigarettes    Start date: 10/11/1984  . Smokeless tobacco: Never Used  . Tobacco comment: Rx for Nioderm  Substance Use Topics  . Alcohol use: No    Alcohol/week: 0.0 oz  . Drug use: No    Interim medical history since last visit reviewed. Allergies and medications reviewed  Review of Systems Per HPI unless specifically indicated above     Objective:    BP 114/74   Pulse 99   Temp 98.3 F (36.8 C) (Oral)   Resp 18   Wt 238 lb 11.2 oz (108.3 kg)   LMP  (LMP Unknown)   SpO2 94%   BMI 45.10 kg/m   Wt Readings from Last 3 Encounters:  04/19/17 238 lb 11.2 oz (108.3 kg)  04/01/17 245 lb 9.6 oz (111.4 kg)  02/13/17 251 lb (113.9 kg)    Physical Exam  Constitutional: She appears well-developed and well-nourished. No distress.  HENT:  Head: Normocephalic and atraumatic.  Eyes: EOM are normal. No scleral icterus.  Neck: No thyromegaly present.  Cardiovascular: Normal rate, regular rhythm and normal heart sounds.  No murmur heard. Pulmonary/Chest: Effort normal and breath sounds normal. No respiratory distress. She has no wheezes.  Abdominal: Soft. Bowel sounds are normal. She exhibits no distension.  Musculoskeletal: Normal range of motion. She exhibits no edema.  Neurological: She is alert. She exhibits normal muscle tone.  Skin: Skin is warm and dry. She is not diaphoretic. No pallor.  Psychiatric: She has a normal mood and affect. Her behavior is normal. Judgment and thought content normal.       Assessment & Plan:   Problem List Items Addressed This Visit      Other   Prediabetes   Relevant Orders   Hemoglobin A1c (Completed)   Low HDL (under 40)    Check lipids today      Relevant Orders   Lipid panel (Completed)   Hyperlipidemia, unspecified    Check lipids      Relevant Orders   Lipid panel (Completed)    Other Visit Diagnoses    Diarrhea of  presumed infectious origin    -  Primary   Relevant Orders   Gastrointestinal Pathogen Panel PCR   COMPLETE METABOLIC PANEL WITH GFR (Completed)   Viral syndrome       Relevant Orders  Gastrointestinal Pathogen Panel PCR   CBC with Differential/Platelet (Completed)   COMPLETE METABOLIC PANEL WITH GFR (Completed)       Follow up plan: Return in about 3 months (around 07/17/2017) for follow-up visit with Dr. Sanda Klein.  An after-visit summary was printed and given to the patient at [place].  Please see the patient instructions which may contain other information and recommendations beyond what is mentioned above in the assessment and plan.  Meds ordered this encounter  Medications  . promethazine (PHENERGAN) 25 MG tablet    Sig: Take 1 tablet (25 mg total) by mouth every 8 (eight) hours as needed for nausea or vomiting.    Dispense:  20 tablet    Refill:  0    Orders Placed This Encounter  Procedures  . Gastrointestinal Pathogen Panel PCR  . CBC with Differential/Platelet  . COMPLETE METABOLIC PANEL WITH GFR  . Lipid panel  . Hemoglobin A1c

## 2017-04-19 NOTE — Patient Instructions (Signed)
Stay well-hydrated Use the phenergan if needed We'll get labs today Go to the ER or urgent care if you get worse If you have not heard anything from my staff in a week about any orders/referrals/studies from today, please contact us here to follow-up [phone]

## 2017-04-19 NOTE — Assessment & Plan Note (Signed)
Check lipids today 

## 2017-04-19 NOTE — Assessment & Plan Note (Signed)
Check lipids 

## 2017-04-20 LAB — CBC WITH DIFFERENTIAL/PLATELET
BASOS PCT: 0.7 %
Basophils Absolute: 69 cells/uL (ref 0–200)
EOS PCT: 9.9 %
Eosinophils Absolute: 980 cells/uL — ABNORMAL HIGH (ref 15–500)
HCT: 46.1 % — ABNORMAL HIGH (ref 35.0–45.0)
Hemoglobin: 16.1 g/dL — ABNORMAL HIGH (ref 11.7–15.5)
Lymphs Abs: 3128 cells/uL (ref 850–3900)
MCH: 31.1 pg (ref 27.0–33.0)
MCHC: 34.9 g/dL (ref 32.0–36.0)
MCV: 89.2 fL (ref 80.0–100.0)
MPV: 11.5 fL (ref 7.5–12.5)
Monocytes Relative: 7.6 %
Neutro Abs: 4970 cells/uL (ref 1500–7800)
Neutrophils Relative %: 50.2 %
PLATELETS: 253 10*3/uL (ref 140–400)
RBC: 5.17 10*6/uL — AB (ref 3.80–5.10)
RDW: 13.2 % (ref 11.0–15.0)
TOTAL LYMPHOCYTE: 31.6 %
WBC: 9.9 10*3/uL (ref 3.8–10.8)
WBCMIX: 752 {cells}/uL (ref 200–950)

## 2017-04-20 LAB — COMPLETE METABOLIC PANEL WITH GFR
AG Ratio: 1.6 (calc) (ref 1.0–2.5)
ALKALINE PHOSPHATASE (APISO): 97 U/L (ref 33–130)
ALT: 14 U/L (ref 6–29)
AST: 17 U/L (ref 10–35)
Albumin: 4.2 g/dL (ref 3.6–5.1)
BUN / CREAT RATIO: 6 (calc) (ref 6–22)
BUN: 6 mg/dL — AB (ref 7–25)
CHLORIDE: 103 mmol/L (ref 98–110)
CO2: 26 mmol/L (ref 20–32)
CREATININE: 0.97 mg/dL (ref 0.50–1.05)
Calcium: 9.7 mg/dL (ref 8.6–10.4)
GFR, Est African American: 75 mL/min/{1.73_m2} (ref >=60)
GFR, Est Non African American: 65 mL/min/{1.73_m2} (ref >=60)
GLUCOSE: 90 mg/dL (ref 65–99)
Globulin: 2.7 g/dL (calc) (ref 1.9–3.7)
Potassium: 3.9 mmol/L (ref 3.5–5.3)
Sodium: 140 mmol/L (ref 135–146)
Total Bilirubin: 0.6 mg/dL (ref 0.2–1.2)
Total Protein: 6.9 g/dL (ref 6.1–8.1)

## 2017-04-20 LAB — LIPID PANEL
CHOLESTEROL: 120 mg/dL (ref <200)
HDL: 34 mg/dL — AB (ref >50)
LDL Cholesterol (Calc): 60 mg/dL (calc)
Non-HDL Cholesterol (Calc): 86 mg/dL (calc) (ref <130)
TRIGLYCERIDES: 191 mg/dL — AB (ref <150)
Total CHOL/HDL Ratio: 3.5 (calc) (ref <5.0)

## 2017-04-20 LAB — HEMOGLOBIN A1C
Hgb A1c MFr Bld: 5.6 % of total Hgb (ref <5.7)
MEAN PLASMA GLUCOSE: 114 (calc)
eAG (mmol/L): 6.3 (calc)

## 2017-04-22 ENCOUNTER — Telehealth: Payer: Self-pay

## 2017-04-22 NOTE — Telephone Encounter (Signed)
Called pt informed her of lab results per Dr.Lada, pt gave verbal understanding.

## 2017-04-22 NOTE — Telephone Encounter (Signed)
-----   Message from Arnetha Courser, MD sent at 04/20/2017 11:13 AM EST ----- Please let patient know that her white count was not elevated; she does have a fair amount of eosinophils, so we'll be looking for her stool studies to see if there are parasites or other infection causing the diarrhea; she looks a little dehydrated; stay well-hydrated Her liver enzymes are okay, so she doesn't have hepatitis, good news; her 3 month blood sugar average is very good; thank you

## 2017-05-01 ENCOUNTER — Other Ambulatory Visit: Payer: Self-pay | Admitting: Family Medicine

## 2017-05-02 MED ORDER — POTASSIUM CHLORIDE ER 10 MEQ PO TBCR: EXTENDED_RELEASE_TABLET | ORAL | 2 refills | Status: DC

## 2017-05-02 MED ORDER — FUROSEMIDE 20 MG PO TABS: ORAL_TABLET | ORAL | 2 refills | Status: DC

## 2017-05-05 DIAGNOSIS — J449 Chronic obstructive pulmonary disease, unspecified: Secondary | ICD-10-CM | POA: Diagnosis not present

## 2017-05-30 ENCOUNTER — Telehealth: Payer: Self-pay | Admitting: Family Medicine

## 2017-05-30 DIAGNOSIS — M5440 Lumbago with sciatica, unspecified side: Secondary | ICD-10-CM

## 2017-05-30 DIAGNOSIS — M1712 Unilateral primary osteoarthritis, left knee: Secondary | ICD-10-CM

## 2017-05-30 DIAGNOSIS — M5441 Lumbago with sciatica, right side: Principal | ICD-10-CM

## 2017-05-30 DIAGNOSIS — G8929 Other chronic pain: Secondary | ICD-10-CM

## 2017-05-30 DIAGNOSIS — M47816 Spondylosis without myelopathy or radiculopathy, lumbar region: Secondary | ICD-10-CM

## 2017-05-30 MED ORDER — BARIATRIC ROLLATOR MISC: 0 refills | Status: DC

## 2017-05-30 NOTE — Telephone Encounter (Signed)
Copied from [place] [id number]. Topic: Quick Communication - See Telephone Encounter >> May 30, 2017  4:16 PM Boyd Kerbs wrote: CRM for notification.   Pt. Called about prescription for rolader.  Pharmacy does not do this, and said they lost it. She needs a hand written one to take to medical supply.  Please call her when ready to pick up   See Telephone encounter for: 05/30/17.

## 2017-05-30 NOTE — Telephone Encounter (Signed)
Called pt and she needs a written prescription for a "Roll-Aider" which is a four wheel walker with a seat and a basket. Pt would like to be called when the prescription is ready so that she may come pick it up. Need to clarify with pt if she wanted it also sent to [facility].

## 2017-05-30 NOTE — Telephone Encounter (Signed)
Bariatric rolling walker Rx printed out; to be faxed to [facility] 99 months ICD-10 codes: M17.2, M54.40, J44.9, E66.01

## 2017-05-30 NOTE — Addendum Note (Signed)
Addended by: Salia Cangemi, Satira Anis on: 05/30/2017 05:08 PM   Modules accepted: Orders

## 2017-06-05 DIAGNOSIS — J449 Chronic obstructive pulmonary disease, unspecified: Secondary | ICD-10-CM | POA: Diagnosis not present

## 2017-06-10 ENCOUNTER — Other Ambulatory Visit: Payer: Self-pay | Admitting: Family Medicine

## 2017-06-11 NOTE — Telephone Encounter (Signed)
Last sgpt and lipids reviewed; Rx approved 

## 2017-06-13 DIAGNOSIS — J449 Chronic obstructive pulmonary disease, unspecified: Secondary | ICD-10-CM | POA: Diagnosis not present

## 2017-07-05 DIAGNOSIS — J449 Chronic obstructive pulmonary disease, unspecified: Secondary | ICD-10-CM | POA: Diagnosis not present

## 2017-07-18 ENCOUNTER — Other Ambulatory Visit: Payer: Self-pay

## 2017-07-18 ENCOUNTER — Ambulatory Visit (INDEPENDENT_AMBULATORY_CARE_PROVIDER_SITE_OTHER): Payer: Medicare Other | Admitting: Family Medicine

## 2017-07-18 ENCOUNTER — Encounter: Payer: Self-pay | Admitting: Family Medicine

## 2017-07-18 VITALS — BP 124/72 | HR 90 | Temp 97.9°F | Resp 16 | Ht 61.0 in | Wt 233.5 lb

## 2017-07-18 DIAGNOSIS — Z5982 Transportation insecurity: Secondary | ICD-10-CM

## 2017-07-18 DIAGNOSIS — B977 Papillomavirus as the cause of diseases classified elsewhere: Secondary | ICD-10-CM

## 2017-07-18 DIAGNOSIS — M1712 Unilateral primary osteoarthritis, left knee: Secondary | ICD-10-CM | POA: Diagnosis not present

## 2017-07-18 DIAGNOSIS — Z1211 Encounter for screening for malignant neoplasm of colon: Secondary | ICD-10-CM | POA: Diagnosis not present

## 2017-07-18 DIAGNOSIS — R251 Tremor, unspecified: Secondary | ICD-10-CM

## 2017-07-18 DIAGNOSIS — Z638 Other specified problems related to primary support group: Secondary | ICD-10-CM

## 2017-07-18 DIAGNOSIS — J449 Chronic obstructive pulmonary disease, unspecified: Secondary | ICD-10-CM

## 2017-07-18 DIAGNOSIS — Z1231 Encounter for screening mammogram for malignant neoplasm of breast: Secondary | ICD-10-CM | POA: Diagnosis not present

## 2017-07-18 DIAGNOSIS — G4730 Sleep apnea, unspecified: Secondary | ICD-10-CM | POA: Diagnosis not present

## 2017-07-18 DIAGNOSIS — Z1239 Encounter for other screening for malignant neoplasm of breast: Secondary | ICD-10-CM

## 2017-07-18 DIAGNOSIS — K529 Noninfective gastroenteritis and colitis, unspecified: Secondary | ICD-10-CM

## 2017-07-18 DIAGNOSIS — Z720 Tobacco use: Secondary | ICD-10-CM | POA: Diagnosis not present

## 2017-07-18 DIAGNOSIS — Z9189 Other specified personal risk factors, not elsewhere classified: Secondary | ICD-10-CM

## 2017-07-18 DIAGNOSIS — Z124 Encounter for screening for malignant neoplasm of cervix: Secondary | ICD-10-CM

## 2017-07-18 NOTE — Assessment & Plan Note (Signed)
Discussed other options to deal with stress

## 2017-07-18 NOTE — Assessment & Plan Note (Addendum)
Cannot go back to ortho because of unpaid bill; suggested biofreeze first and then refer to another ortho if needed

## 2017-07-18 NOTE — Assessment & Plan Note (Signed)
Refer to GYN; overdue for pap smear

## 2017-07-18 NOTE — Assessment & Plan Note (Signed)
Not seeing pulm; managed here; controlled except during recent pollen season

## 2017-07-18 NOTE — Assessment & Plan Note (Signed)
Questionable diagnosis, patient doubts, had sleep study at [facility] and was not told to use CPAP
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
## 2017-07-18 NOTE — Assessment & Plan Note (Signed)
Encouraged weight loss 

## 2017-07-18 NOTE — Progress Notes (Signed)
BP 124/72   Pulse 90   Temp 97.9 F (36.6 C) (Oral)   Resp 16   Ht 5\' 1"  (1.549 m)   Wt 233 lb 8 oz (105.9 kg)   LMP  (LMP Unknown)   SpO2 93%   BMI 44.12 kg/m    Subjective:    Patient ID: Nicole Austin, female    DOB: May 04, 1960, 57 y.o.   MRN: [id number]  HPI: Nicole Austin is a 57 y.o. female  Chief Complaint  Patient presents with  . Follow-up    HPI See above  Depression screen [facility] 2/9 07/18/2017 04/19/2017 02/13/2017 01/10/2017 10/16/2016  Decreased Interest 0 0 3 0 2  Down, Depressed, Hopeless 0 1 3 1 2   PHQ - 2 Score 0 1 6 1 4   Altered sleeping - - 3 - 3  Tired, decreased energy - - 3 - 3  Change in appetite - - 3 - 1  Feeling bad or failure about yourself  - - 3 - 2  Trouble concentrating - - 3 - 2  Moving slowly or fidgety/restless - - 1 - 2  Suicidal thoughts - - 0 - 0  PHQ-9 Score - - 22 - 17  Difficult doing work/chores - - Very difficult - Not difficult at all  Some recent data might be hidden    Relevant past medical, surgical, family and social history reviewed Past Medical History:  Diagnosis Date  . ADHD (attention deficit hyperactivity disorder)   . Apnea, sleep 06/30/2015  . Arthritis   . Asthma   . Asthma with acute exacerbation 06/30/2015  . Bipolar 1 disorder ([place])   . COPD (chronic obstructive pulmonary disease) ([place])   . COPD, moderate ([place]) 11/18/2013   Overview:  Last Assessment & Plan:  Refer to pulmonologist Overview:  Overview:  Last Assessment & Plan:  Pneumonia vaccine offered and given today; she did not tolerate LABA; will start her on Spiriva; stop combivent neb; use only SABA neb OR inhaler, not both; smoking cessation urged, and I am here to help if she wants assistance quitting; f/u in 4 weeks Last Assessment & Plan:  Pneumonia vaccin  . Depression   . GERD (gastroesophageal reflux disease) 08/30/2015  . History of acute myocardial infarction 06/30/2015   Overview:  [facility]   . Low HDL (under 40) 08/30/2015  . MI  (myocardial infarction) ([place])   . Prediabetes 05/20/2016  . PTSD (post-traumatic stress disorder)   . Rhabdomyolysis   . Sleep apnea 06/30/2015  . Stage 3 severe COPD by GOLD classification ([place]) 11/18/2013  . Stroke ([place])   . Uncomplicated asthma 4/0/9811  . Varicella 02/25/2017   Past Surgical History:  Procedure Laterality Date  . BACK SURGERY    . REPLACEMENT TOTAL KNEE Left    Family History  Problem Relation Age of Onset  . Hernia Mother   . Heart disease Mother   . OCD Mother   . Diabetes Mother   . Parkinson's disease Father   . Bipolar disorder Sister   . Schizophrenia Sister   . ADD / ADHD Sister   . Alcohol abuse Brother   . Bipolar disorder Sister   . Paranoid behavior Sister   . ADD / ADHD Sister   . ADD / ADHD Son   . Dementia Maternal Grandmother   . Emphysema Maternal Grandfather   . ADD / ADHD Son   . ADD / ADHD Son   . Depression Son    Social History  Tobacco Use  . Smoking status: Current Every Day Smoker    Packs/day: 1.00    Years: 36.00    Pack years: 36.00    Types: Cigarettes    Start date: 10/11/1984  . Smokeless tobacco: Never Used  . Tobacco comment: Rx for Nioderm  Substance Use Topics  . Alcohol use: No    Alcohol/week: 0.0 oz  . Drug use: No    Interim medical history since last visit reviewed. Allergies and medications reviewed  Review of Systems Per HPI unless specifically indicated above     Objective:    BP 124/72   Pulse 90   Temp 97.9 F (36.6 C) (Oral)   Resp 16   Ht 5\' 1"  (1.549 m)   Wt 233 lb 8 oz (105.9 kg)   LMP  (LMP Unknown)   SpO2 93%   BMI 44.12 kg/m   Wt Readings from Last 3 Encounters:  07/18/17 233 lb 8 oz (105.9 kg)  04/19/17 238 lb 11.2 oz (108.3 kg)  04/01/17 245 lb 9.6 oz (111.4 kg)    Physical Exam  Constitutional: She appears well-developed and well-nourished. No distress.  Morbidly obese  HENT:  Head: Normocephalic and atraumatic.  Eyes: EOM are normal. No scleral icterus.  Neck: No  thyromegaly present.  Cardiovascular: Normal rate, regular rhythm and normal heart sounds.  No murmur heard. Pulmonary/Chest: Effort normal. No respiratory distress. She has wheezes.  Abdominal: Soft. Bowel sounds are normal. She exhibits no distension.  Musculoskeletal: Normal range of motion. She exhibits no edema.  Neurological: She is alert. She exhibits normal muscle tone.  Skin: Skin is warm and dry. She is not diaphoretic. No pallor.  Psychiatric: She has a normal mood and affect. Her behavior is normal. Judgment and thought content normal.    Results for orders placed or performed in visit on 04/19/17  CBC with Differential/Platelet  Result Value Ref Range   WBC 9.9 3.8 - 10.8 Thousand/uL   RBC 5.17 (H) 3.80 - 5.10 Million/uL   Hemoglobin 16.1 (H) 11.7 - 15.5 g/dL   HCT 46.1 (H) 35.0 - 45.0 %   MCV 89.2 80.0 - 100.0 fL   MCH 31.1 27.0 - 33.0 pg   MCHC 34.9 32.0 - 36.0 g/dL   RDW 13.2 11.0 - 15.0 %   Platelets 253 140 - 400 Thousand/uL   MPV 11.5 7.5 - 12.5 fL   Neutro Abs 4,970 1,500 - 7,800 cells/uL   Lymphs Abs 3,128 850 - 3,900 cells/uL   WBC mixed population 752 200 - 950 cells/uL   Eosinophils Absolute 980 (H) 15 - 500 cells/uL   Basophils Absolute 69 0 - 200 cells/uL   Neutrophils Relative % 50.2 %   Total Lymphocyte 31.6 %   Monocytes Relative 7.6 %   Eosinophils Relative 9.9 %   Basophils Relative 0.7 %  COMPLETE METABOLIC PANEL WITH GFR  Result Value Ref Range   Glucose, Bld 90 65 - 99 mg/dL   BUN 6 (L) 7 - 25 mg/dL   Creat 0.97 0.50 - 1.05 mg/dL   GFR, Est Non African American 65 > OR = 60 mL/min/1.56m2   GFR, Est African American 75 > OR = 60 mL/min/1.19m2   BUN/Creatinine Ratio 6 6 - 22 (calc)   Sodium 140 135 - 146 mmol/L   Potassium 3.9 3.5 - 5.3 mmol/L   Chloride 103 98 - 110 mmol/L   CO2 26 20 - 32 mmol/L   Calcium 9.7 8.6 - 10.4 mg/dL  Total Protein 6.9 6.1 - 8.1 g/dL   Albumin 4.2 3.6 - 5.1 g/dL   Globulin 2.7 1.9 - 3.7 g/dL (calc)   AG  Ratio 1.6 1.0 - 2.5 (calc)   Total Bilirubin 0.6 0.2 - 1.2 mg/dL   Alkaline phosphatase (APISO) 97 33 - 130 U/L   AST 17 10 - 35 U/L   ALT 14 6 - 29 U/L  Lipid panel  Result Value Ref Range   Cholesterol 120 <200 mg/dL   HDL 34 (L) >50 mg/dL   Triglycerides 191 (H) <150 mg/dL   LDL Cholesterol (Calc) 60 mg/dL (calc)   Total CHOL/HDL Ratio 3.5 <5.0 (calc)   Non-HDL Cholesterol (Calc) 86 <130 mg/dL (calc)  Hemoglobin A1c  Result Value Ref Range   Hgb A1c MFr Bld 5.6 <5.7 % of total Hgb   Mean Plasma Glucose 114 (calc)   eAG (mmol/L) 6.3 (calc)      Assessment & Plan:   Problem List Items Addressed This Visit      Respiratory   Sleep apnea    Questionable diagnosis, patient doubts, had sleep study at [facility] and was not told to use CPAP      COPD, moderate ([place]) - Primary    Not seeing pulm; managed here; controlled except during recent pollen season        Musculoskeletal and Integument   Arthritis of knee, degenerative (Chronic)    Cannot go back to ortho because of unpaid bill; suggested biofreeze first and then refer to another ortho if needed        Other   Morbid obesity ([place])    Encouraged weight loss      HPV (human papilloma virus) infection    Refer to GYN; overdue for pap smear      Relevant Orders   Ambulatory referral to Obstetrics / Gynecology   Current tobacco use    Discussed other options to deal with stress       Other Visit Diagnoses    Lack of access to transportation       Relevant Orders   Ambulatory referral to Connected Care   Stress due to family tension       Screen for colon cancer       Screening for breast cancer       Relevant Orders   MM Digital Screening   Tremor of both hands       Relevant Orders   Ambulatory referral to Neurology   Cervical cancer screening       Relevant Orders   Ambulatory referral to Obstetrics / Gynecology   Chronic diarrhea       Relevant Orders   Ambulatory referral to  Gastroenterology       Follow up plan: Return in about 1 month (around 08/18/2017).  An after-visit summary was printed and given to the patient at [facility].  Please see the patient instructions which may contain other information and recommendations beyond what is mentioned above in the assessment and plan.  No orders of the defined types were placed in this encounter.   Orders Placed This Encounter  Procedures  . MM Digital Screening  . Ambulatory referral to Connected Care  . Ambulatory referral to Neurology  . Ambulatory referral to Obstetrics / Gynecology  . Ambulatory referral to Gastroenterology

## 2017-07-18 NOTE — Patient Instructions (Signed)
I do encourage you to quit smoking Call [phone] to sign up for smoking cessation classes You can call 1-800-QUIT-NOW to talk with a smoking cessation coach Check out the information at familydoctor.org entitled "Nutrition for Weight Loss: What You Need to Know about Fad Diets" Try to lose between 1-2 pounds per week by taking in fewer calories and burning off more calories You can succeed by limiting portions, limiting foods dense in calories and fat, becoming more active, and drinking 8 glasses of water a day (64 ounces) Don't skip meals, especially breakfast, as skipping meals may alter your metabolism Do not use over-the-counter weight loss pills or gimmicks that claim rapid weight loss A healthy BMI (or body mass index) is between 18.5 and 24.9 You can calculate your ideal BMI at the [employer] website ClubMonetize.fr We'll refer you to the gynecologist and the gastroenterologist We'll have someone from the [facility] care team contact you about your needs If you have not heard anything from my staff in a week about any orders/referrals/studies from today, please contact us here to follow-up [phone]

## 2017-07-23 ENCOUNTER — Telehealth: Payer: Self-pay | Admitting: Obstetrics and Gynecology

## 2017-07-23 NOTE — Telephone Encounter (Signed)
-----   Message ----- From: Will Bonnet, MD Sent: 07/19/2017   1:48 PM To: Coralee Pesa Paschal Subject: RE: Referral.                                  She should be seen for a visit where a pap smear needs to be performed.  If she has a hard time with mobility, she should be scheduled for this in the procedure room. Thank you! Will Bonnet, MD    ----- Message ----- From: Audree Camel Sent: 07/19/2017   8:10 AM To: Will Bonnet, MD Subject: Referral.                                      Hey Dr. Glennon Mac, Can you please advise if this patient show come in for an COLPO or an Consultation. Thank you.   [facility] referring for HPV (human papilloma virus) infection Cervical cancer screening. I called and left voicemail for patient to call back to be schedule for pap smear.

## 2017-07-23 NOTE — Telephone Encounter (Signed)
Referral scheduled with [facility] 5/31

## 2017-07-26 ENCOUNTER — Telehealth: Payer: Self-pay | Admitting: Gastroenterology

## 2017-07-26 ENCOUNTER — Ambulatory Visit: Payer: Self-pay | Admitting: Obstetrics and Gynecology

## 2017-07-26 ENCOUNTER — Ambulatory Visit: Payer: Self-pay

## 2017-07-26 NOTE — Telephone Encounter (Signed)
PT IS RETURNING CALL FOR MICHELLE

## 2017-07-26 NOTE — Telephone Encounter (Signed)
Patient has been contacted to move up her colonoscopy with Dr. Bonna Gains from 08/01/17 to 07/31/17.  Pt has agreed to move up her colonoscopy date and I've notified Esther in Endo of change.  Thanks [employer]

## 2017-07-31 ENCOUNTER — Encounter: Payer: Self-pay | Admitting: Certified Registered Nurse Anesthetist

## 2017-07-31 ENCOUNTER — Other Ambulatory Visit: Payer: Self-pay

## 2017-07-31 ENCOUNTER — Encounter: Admission: RE | Payer: Self-pay | Source: Ambulatory Visit

## 2017-07-31 ENCOUNTER — Ambulatory Visit: Admission: RE | Admit: 2017-07-31 | Payer: Medicare Other | Source: Ambulatory Visit | Admitting: Gastroenterology

## 2017-07-31 DIAGNOSIS — Z1211 Encounter for screening for malignant neoplasm of colon: Secondary | ICD-10-CM

## 2017-07-31 SURGERY — COLONOSCOPY WITH PROPOFOL
Anesthesia: General

## 2017-07-31 MED ORDER — PROPOFOL 500 MG/50ML IV EMUL
INTRAVENOUS | Status: AC
Start: 2017-07-31 — End: ?
  Filled 2017-07-31: qty 50

## 2017-08-05 ENCOUNTER — Ambulatory Visit: Payer: Self-pay | Admitting: Obstetrics and Gynecology

## 2017-08-05 DIAGNOSIS — J449 Chronic obstructive pulmonary disease, unspecified: Secondary | ICD-10-CM | POA: Diagnosis not present

## 2017-08-06 ENCOUNTER — Encounter: Payer: Self-pay | Admitting: Student

## 2017-08-07 ENCOUNTER — Encounter: Payer: Self-pay | Admitting: *Deleted

## 2017-08-07 ENCOUNTER — Ambulatory Visit: Payer: Medicare Other | Admitting: Certified Registered"

## 2017-08-07 ENCOUNTER — Encounter
Admission: RE | Disposition: A | Discharge status: Home / Self Care | Payer: Self-pay | Source: Ambulatory Visit | Attending: Gastroenterology

## 2017-08-07 ENCOUNTER — Ambulatory Visit
Admission: RE | Admit: 2017-08-07 | Discharge: 2017-08-07 | Disposition: A | Discharge status: Home / Self Care | Payer: Medicare Other | Source: Ambulatory Visit | Attending: Gastroenterology | Admitting: Gastroenterology

## 2017-08-07 DIAGNOSIS — Z885 Allergy status to narcotic agent status: Secondary | ICD-10-CM | POA: Diagnosis not present

## 2017-08-07 DIAGNOSIS — F319 Bipolar disorder, unspecified: Secondary | ICD-10-CM | POA: Insufficient documentation

## 2017-08-07 DIAGNOSIS — D125 Benign neoplasm of sigmoid colon: Secondary | ICD-10-CM | POA: Diagnosis not present

## 2017-08-07 DIAGNOSIS — Z9981 Dependence on supplemental oxygen: Secondary | ICD-10-CM | POA: Diagnosis not present

## 2017-08-07 DIAGNOSIS — F4310 Post-traumatic stress disorder, unspecified: Secondary | ICD-10-CM | POA: Insufficient documentation

## 2017-08-07 DIAGNOSIS — I252 Old myocardial infarction: Secondary | ICD-10-CM | POA: Diagnosis not present

## 2017-08-07 DIAGNOSIS — Z96652 Presence of left artificial knee joint: Secondary | ICD-10-CM | POA: Insufficient documentation

## 2017-08-07 DIAGNOSIS — Z888 Allergy status to other drugs, medicaments and biological substances status: Secondary | ICD-10-CM | POA: Insufficient documentation

## 2017-08-07 DIAGNOSIS — I2510 Atherosclerotic heart disease of native coronary artery without angina pectoris: Secondary | ICD-10-CM | POA: Insufficient documentation

## 2017-08-07 DIAGNOSIS — F909 Attention-deficit hyperactivity disorder, unspecified type: Secondary | ICD-10-CM | POA: Diagnosis not present

## 2017-08-07 DIAGNOSIS — G709 Myoneural disorder, unspecified: Secondary | ICD-10-CM | POA: Insufficient documentation

## 2017-08-07 DIAGNOSIS — Z8249 Family history of ischemic heart disease and other diseases of the circulatory system: Secondary | ICD-10-CM | POA: Insufficient documentation

## 2017-08-07 DIAGNOSIS — Z1211 Encounter for screening for malignant neoplasm of colon: Secondary | ICD-10-CM | POA: Diagnosis not present

## 2017-08-07 DIAGNOSIS — D122 Benign neoplasm of ascending colon: Secondary | ICD-10-CM

## 2017-08-07 DIAGNOSIS — Z79899 Other long term (current) drug therapy: Secondary | ICD-10-CM | POA: Insufficient documentation

## 2017-08-07 DIAGNOSIS — F17210 Nicotine dependence, cigarettes, uncomplicated: Secondary | ICD-10-CM | POA: Insufficient documentation

## 2017-08-07 DIAGNOSIS — G4730 Sleep apnea, unspecified: Secondary | ICD-10-CM | POA: Diagnosis not present

## 2017-08-07 DIAGNOSIS — K635 Polyp of colon: Secondary | ICD-10-CM | POA: Diagnosis not present

## 2017-08-07 DIAGNOSIS — F419 Anxiety disorder, unspecified: Secondary | ICD-10-CM | POA: Insufficient documentation

## 2017-08-07 DIAGNOSIS — Z8673 Personal history of transient ischemic attack (TIA), and cerebral infarction without residual deficits: Secondary | ICD-10-CM | POA: Insufficient documentation

## 2017-08-07 DIAGNOSIS — Z8619 Personal history of other infectious and parasitic diseases: Secondary | ICD-10-CM | POA: Insufficient documentation

## 2017-08-07 DIAGNOSIS — J449 Chronic obstructive pulmonary disease, unspecified: Secondary | ICD-10-CM | POA: Insufficient documentation

## 2017-08-07 DIAGNOSIS — Z6841 Body Mass Index (BMI) 40.0 and over, adult: Secondary | ICD-10-CM | POA: Diagnosis not present

## 2017-08-07 DIAGNOSIS — R7303 Prediabetes: Secondary | ICD-10-CM | POA: Insufficient documentation

## 2017-08-07 HISTORY — PX: COLONOSCOPY WITH PROPOFOL: SHX5780

## 2017-08-07 HISTORY — DX: Prediabetes: R73.03

## 2017-08-07 SURGERY — COLONOSCOPY WITH PROPOFOL
Anesthesia: General

## 2017-08-07 MED ORDER — MIDAZOLAM HCL 2 MG/2ML IJ SOLN
INTRAMUSCULAR | Status: DC | PRN
  Administered 2017-08-07: 2 mg via INTRAVENOUS

## 2017-08-07 MED ORDER — EPHEDRINE SULFATE 50 MG/ML IJ SOLN
INTRAMUSCULAR | Status: AC
  Filled 2017-08-07: qty 1

## 2017-08-07 MED ORDER — PHENYLEPHRINE HCL 10 MG/ML IJ SOLN
INTRAMUSCULAR | Status: AC
  Filled 2017-08-07: qty 1

## 2017-08-07 MED ORDER — SODIUM CHLORIDE 0.9 % IV SOLN
INTRAVENOUS | Status: DC
  Administered 2017-08-07: 1000 mL via INTRAVENOUS

## 2017-08-07 MED ORDER — PROPOFOL 500 MG/50ML IV EMUL
INTRAVENOUS | Status: AC
  Filled 2017-08-07: qty 50

## 2017-08-07 MED ORDER — EPHEDRINE SULFATE 50 MG/ML IJ SOLN
INTRAMUSCULAR | Status: DC | PRN
  Administered 2017-08-07: 20 mg via INTRAVENOUS

## 2017-08-07 MED ORDER — PROPOFOL 10 MG/ML IV BOLUS
INTRAVENOUS | Status: DC | PRN
  Administered 2017-08-07: 50 mg via INTRAVENOUS

## 2017-08-07 MED ORDER — PROPOFOL 10 MG/ML IV BOLUS
INTRAVENOUS | Status: AC
  Filled 2017-08-07: qty 20

## 2017-08-07 MED ORDER — MIDAZOLAM HCL 2 MG/2ML IJ SOLN
INTRAMUSCULAR | Status: AC
  Filled 2017-08-07: qty 2

## 2017-08-07 MED ORDER — SODIUM CHLORIDE 0.9 % IV SOLN
INTRAVENOUS | Status: DC | PRN
  Administered 2017-08-07: 13:00:00 via INTRAVENOUS

## 2017-08-07 MED ORDER — GLYCOPYRROLATE 0.2 MG/ML IJ SOLN
INTRAMUSCULAR | Status: AC
  Filled 2017-08-07: qty 1

## 2017-08-07 MED ORDER — LIDOCAINE HCL (PF) 1 % IJ SOLN
INTRAMUSCULAR | Status: AC
  Administered 2017-08-07: 0.3 mL
  Filled 2017-08-07: qty 2

## 2017-08-07 MED ORDER — LIDOCAINE HCL (CARDIAC) PF 100 MG/5ML IV SOSY
PREFILLED_SYRINGE | INTRAVENOUS | Status: DC | PRN
  Administered 2017-08-07: 50 mg via INTRAVENOUS

## 2017-08-07 MED ORDER — GLYCOPYRROLATE 0.2 MG/ML IJ SOLN
INTRAMUSCULAR | Status: DC | PRN
  Administered 2017-08-07: 0.1 mg via INTRAVENOUS

## 2017-08-07 MED ORDER — LIDOCAINE HCL (PF) 2 % IJ SOLN
INTRAMUSCULAR | Status: AC
  Filled 2017-08-07: qty 10

## 2017-08-07 MED ORDER — PROPOFOL 500 MG/50ML IV EMUL
INTRAVENOUS | Status: DC | PRN
  Administered 2017-08-07: 100 ug/kg/min via INTRAVENOUS

## 2017-08-07 NOTE — Op Note (Signed)
[facility] Patient Name: Areli Weir Procedure Date: 08/07/2017 12:50 PM MRN: [id number] Account #: [id number] Date of Birth: [birth year] Admit Type: Outpatient Age: 57 Room: [facility] ENDO ROOM 2 Gender: Female Note Status: Finalized Procedure:            Colonoscopy Indications:          Screening for colorectal malignant neoplasm Providers:            Mohamud Mrozek B. Maximino Greenland MD, MD Referring MD:         Darrin Nipper. Kizzie Ide (Referring MD) Medicines:            Monitored Anesthesia Care Complications:        No immediate complications. Procedure:            Pre-Anesthesia Assessment:                       - ASA Grade Assessment: III - A patient with severe                        systemic disease.                       - Prior to the procedure, a History and Physical was                        performed, and patient medications, allergies and                        sensitivities were reviewed. The patient's tolerance of                        previous anesthesia was reviewed.                       - The risks and benefits of the procedure and the                        sedation options and risks were discussed with the                        patient. All questions were answered and informed                        consent was obtained.                       - Patient identification and proposed procedure were                        verified prior to the procedure by the physician, the                        nurse, the anesthesiologist, the anesthetist and the                        technician. The procedure was verified in the procedure                        room.  After obtaining informed consent, the colonoscope was                        passed under direct vision. Throughout the procedure,                        the patient's blood pressure, pulse, and oxygen                        saturations were monitored continuously. The                         Colonoscope was introduced through the anus and                        advanced to the the cecum, identified by appendiceal                        orifice and ileocecal valve. The colonoscopy was                        performed with ease. The patient tolerated the                        procedure well. The quality of the bowel preparation                        was fair except the ascending colon was poor and the                        cecum was poor. Findings:      The perianal and digital rectal examinations were normal.      A 3 mm polyp was found in the ascending colon. The polyp was sessile.       The polyp was removed with a cold biopsy forceps. Resection and       retrieval were complete.      Six sessile polyps were found in the sigmoid colon and ascending colon.       The polyps were 4 to 7 mm in size. These polyps were removed with a cold       snare. Resection and retrieval were complete.      A 7 mm polyp was found in the sigmoid colon. The polyp was       semi-pedunculated. The polyp was removed with a hot snare. Resection and       retrieval were complete.      The exam was otherwise without abnormality.      The rectum, sigmoid colon, descending colon, transverse colon, ascending       colon and cecum appeared normal.      The retroflexed view of the distal rectum and anal verge was normal and       showed no anal or rectal abnormalities. Impression:           - One 3 mm polyp in the ascending colon, removed with a                        cold biopsy forceps. Resected and retrieved.                       -  Six 4 to 7 mm polyps in the sigmoid colon and in the                        ascending colon, removed with a cold snare. Resected                        and retrieved.                       - One 7 mm polyp in the sigmoid colon, removed with a                        hot snare. Resected and retrieved.                       - The examination was otherwise  normal.                       - The rectum, sigmoid colon, descending colon,                        transverse colon, ascending colon and cecum are normal.                       - The distal rectum and anal verge are normal on                        retroflexion view. Recommendation:       - Discharge patient to home (with escort).                       - Advance diet as tolerated.                       - Continue present medications.                       - Await pathology results.                       - Repeat colonoscopy in 6 months for surveillance with                        2 day prep due to poor prep.                       - The findings and recommendations were discussed with                        the patient.                       - The findings and recommendations were discussed with                        the patient's family.                       - Return to primary care physician as previously                        scheduled. Procedure Code(s):    ---  Professional ---                       [phone], Colonoscopy, flexible; with removal of tumor(s),                        polyp(s), or other lesion(s) by snare technique                       45380, 59, Colonoscopy, flexible; with biopsy, single                        or multiple Diagnosis Code(s):    --- Professional ---                       Z12.11, Encounter for screening for malignant neoplasm                        of colon                       D12.2, Benign neoplasm of ascending colon                       D12.5, Benign neoplasm of sigmoid colon CPT copyright 2017 American Medical Association. All rights reserved. The codes documented in this report are preliminary and upon coder review may  be revised to meet current compliance requirements.  Melodie Bouillon, MD Michel Bickers B. Maximino Greenland MD, MD 08/07/2017 1:53:45 PM This report has been signed electronically. Number of Addenda: 0 Note Initiated On: 08/07/2017 12:50  PM Scope Withdrawal Time: 0 hours 29 minutes 43 seconds  Total Procedure Duration: 0 hours 34 minutes 39 seconds  Estimated Blood Loss: Estimated blood loss: none.      [facility]

## 2017-08-07 NOTE — Anesthesia Postprocedure Evaluation (Signed)
Anesthesia Post Note  Patient: Nicole Austin  Procedure(s) Performed: COLONOSCOPY WITH PROPOFOL (N/A )  Patient location during evaluation: Endoscopy Anesthesia Type: General Level of consciousness: awake and alert, oriented and patient cooperative Pain management: satisfactory to patient Vital Signs Assessment: post-procedure vital signs reviewed and stable Respiratory status: spontaneous breathing and respiratory function stable Cardiovascular status: blood pressure returned to baseline and stable Postop Assessment: no backache, no headache, patient able to bend at knees, no apparent nausea or vomiting, adequate PO intake and able to ambulate Anesthetic complications: no     Last Vitals:  Vitals:   08/07/17 1237  BP: 128/68  Pulse: 62  Resp: 17  Temp: (!) 36.1 C  SpO2: 96%    Last Pain:  Vitals:   08/07/17 1237  TempSrc: Tympanic  PainSc: 0-No pain                 Bayler Gehrig H Jaydis Duchene

## 2017-08-07 NOTE — Anesthesia Post-op Follow-up Note (Signed)
Anesthesia QCDR form completed.        

## 2017-08-07 NOTE — H&P (Signed)
Nicole Antigua, MD [address], [place], [place], [place], [address] [address], [place], [place], [place], [address] Phone: [phone]  Fax: [phone]  Primary Care Physician:  Arnetha Courser, MD   Pre-Procedure History & Physical: HPI:  Nicole Austin is a 57 y.o. female is here for a colonoscopy.   Past Medical History:  Diagnosis Date  . ADHD (attention deficit hyperactivity disorder)   . Apnea, sleep 06/30/2015  . Arthritis   . Asthma   . Asthma with acute exacerbation 06/30/2015  . Bipolar 1 disorder ([place])   . COPD (chronic obstructive pulmonary disease) ([place])   . COPD, moderate ([place]) 11/18/2013   Overview:  Last Assessment & Plan:  Refer to pulmonologist Overview:  Overview:  Last Assessment & Plan:  Pneumonia vaccine offered and given today; she did not tolerate LABA; will start her on Spiriva; stop combivent neb; use only SABA neb OR inhaler, not both; smoking cessation urged, and I am here to help if she wants assistance quitting; f/u in 4 weeks Last Assessment & Plan:  Pneumonia vaccin  . Depression   . GERD (gastroesophageal reflux disease) 08/30/2015  . History of acute myocardial infarction 06/30/2015   Overview:  [facility]   . Low HDL (under 40) 08/30/2015  . MI (myocardial infarction) ([place])   . Pre-diabetes   . Prediabetes 05/20/2016  . PTSD (post-traumatic stress disorder)   . Rhabdomyolysis   . Sleep apnea 06/30/2015  . Stage 3 severe COPD by GOLD classification ([place]) 11/18/2013  . Stroke ([place])   . Uncomplicated asthma 07/28/8313  . Varicella 02/25/2017    Past Surgical History:  Procedure Laterality Date  . BACK SURGERY    . REPLACEMENT TOTAL KNEE Left     Prior to Admission medications   Medication Sig Start Date End Date Taking? Authorizing Provider  acetaminophen (TYLENOL) 500 MG tablet Take 500 mg by mouth every 6 (six) hours as needed.    [provider]  albuterol (PROVENTIL HFA;VENTOLIN HFA) 108 (90 Base) MCG/ACT inhaler Inhale 2  puffs into the lungs every 6 (six) hours as needed for wheezing or shortness of breath. 05/31/15   Orbie Pyo, MD  albuterol (PROVENTIL) (2.5 MG/3ML) 0.083% nebulizer solution Take 3 mLs (2.5 mg total) every 4 (four) hours as needed by nebulization for wheezing or shortness of breath. 01/05/17   Gouru, Illene Silver, MD  Alpha-D-Galactosidase (BEANO PO) Take 1 tablet by mouth as needed.    [provider]  ALPRAZolam Duanne Moron) 0.5 MG tablet Take 0.5 mg 3 (three) times daily as needed by mouth for anxiety or sleep.    [provider]  aspirin-acetaminophen-caffeine (EXCEDRIN MIGRAINE) [phone] MG tablet Take by mouth every 6 (six) hours as needed for headache.    [provider]  atorvastatin (LIPITOR) 40 MG tablet TAKE 1 TABLET BY MOUTH AT BEDTIME 06/11/17   Lada, Satira Anis, MD  divalproex (DEPAKOTE) 250 MG DR tablet Take 1 tablet (250 mg total) by mouth 2 (two) times daily. 11/19/16   Rainey Pines, MD  escitalopram (LEXAPRO) 20 MG tablet Take 1 tablet (20 mg total) by mouth daily. 11/19/16   Rainey Pines, MD  furosemide (LASIX) 20 MG tablet One by mouth every three days for swelling 05/02/17 10/05/18  Lada, Satira Anis, MD  ipratropium-albuterol (DUONEB) 0.5-2.5 (3) MG/3ML SOLN Take 3 mLs every 6 (six) hours by nebulization. Patient not taking: Reported on 07/18/2017 01/05/17   Nicholes Mango, MD  lurasidone (LATUDA) 40 MG TABS tablet Take 60  mg by mouth every evening.     [provider]  Misc. Devices (BARIATRIC ROLLATOR) MISC For use when walking; dx: degenerative changes of spine, OA of knee; morbid obesity, COPD 05/30/17   Lada, Satira Anis, MD  Multiple Vitamin (MULTIVITAMIN WITH MINERALS) TABS tablet Take 1 tablet by mouth daily.    [provider]  potassium chloride (K-DUR) 10 MEQ tablet TAKE 1 TABLET BY MOUTH EVERY THREE DAYS 05/02/17   Arnetha Courser, MD  promethazine (PHENERGAN) 25 MG tablet Take 1 tablet (25 mg total) by mouth every 8 (eight) hours as  needed for nausea or vomiting. 04/19/17   Lada, Satira Anis, MD  Respiratory Therapy Supplies (NEBULIZER COMPRESSOR) KIT One nebulizer kit/machine with tubing; for use with nebulized albuterol Patient not taking: Reported on 04/19/2017 02/10/16   Arnetha Courser, MD  umeclidinium-vilanterol (ANORO ELLIPTA) 62.5-25 MCG/INH AEPB Inhale 1 puff into the lungs daily. (this replaces the spiriva) 02/13/17   Lada, Satira Anis, MD    Allergies as of 07/31/2017 - Review Complete 07/18/2017  Allergen Reaction Noted  . Chantix [varenicline] Nausea Only 02/07/2016  . Codeine Nausea And Vomiting 09/23/2014    Family History  Problem Relation Age of Onset  . Hernia Mother   . Heart disease Mother   . OCD Mother   . Diabetes Mother   . Parkinson's disease Father   . Bipolar disorder Sister   . Schizophrenia Sister   . ADD / ADHD Sister   . Alcohol abuse Brother   . Bipolar disorder Sister   . Paranoid behavior Sister   . ADD / ADHD Sister   . ADD / ADHD Son   . Dementia Maternal Grandmother   . Emphysema Maternal Grandfather   . ADD / ADHD Son   . ADD / ADHD Son   . Depression Son     Social History   Socioeconomic History  . Marital status: Single    Spouse name: Not on file  . Number of children: Not on file  . Years of education: Not on file  . Highest education level: Not on file  Occupational History  . Occupation: Disability  Social Needs  . Financial resource strain: Somewhat hard  . Food insecurity:    Worry: Sometimes true    Inability: Sometimes true  . Transportation needs:    Medical: No    Non-medical: No  Tobacco Use  . Smoking status: Current Every Day Smoker    Packs/day: 1.00    Years: 36.00    Pack years: 36.00    Types: Cigarettes    Start date: 10/11/1984  . Smokeless tobacco: Never Used  . Tobacco comment: Rx for Nioderm  Substance and Sexual Activity  . Alcohol use: No    Alcohol/week: 0.0 oz  . Drug use: No  . Sexual activity: Not Currently  Lifestyle   . Physical activity:    Days per week: 0 days    Minutes per session: 0 min  . Stress: Rather much  Relationships  . Social connections:    Talks on phone: Once a week    Gets together: Once a week    Attends religious service: Never    Active member of club or organization: No    Attends meetings of clubs or organizations: Never    Relationship status: Patient refused  . Intimate partner violence:    Fear of current or ex partner: No    Emotionally abused: No    Physically abused: No  Forced sexual activity: No  Other Topics Concern  . Not on file  Social History Narrative   Pt is currently homeless but living with her sister temporarily. Has reached out to [employer] with housing and food. Sister has agreed to assist with Transportation to all appts and to [employer]. Pt has also made arrangements with [facility] for assistance with affording Nicoderm and Mucinex.    Review of Systems: See HPI, otherwise negative ROS  Physical Exam: LMP  (LMP Unknown)  General:   Alert,  pleasant and cooperative in NAD Head:  Normocephalic and atraumatic. Neck:  Supple; no masses or thyromegaly. Lungs:  Clear throughout to auscultation, normal respiratory effort.    Heart:  +S1, +S2, Regular rate and rhythm, No edema. Abdomen:  Soft, nontender and nondistended. Normal bowel sounds, without guarding, and without rebound.   Neurologic:  Alert and  oriented x4;  grossly normal neurologically.  Impression/Plan: Nicole Austin is here for a colonoscopy to be performed for average risk screening.  Risks, benefits, limitations, and alternatives regarding  colonoscopy have been reviewed with the patient.  Questions have been answered.  All parties agreeable.   Virgel Manifold, MD  08/07/2017, 11:55 AM

## 2017-08-07 NOTE — Anesthesia Preprocedure Evaluation (Addendum)
Anesthesia Evaluation  Patient identified by MRN, date of birth, ID band Patient awake    Reviewed: Allergy & Precautions, H&P , NPO status , Patient's Chart, lab work & pertinent test results, reviewed documented beta blocker date and time   History of Anesthesia Complications Negative for: history of anesthetic complications  Airway Mallampati: I  TM Distance: >3 FB Neck ROM: full    Dental  (+) Dental Advidsory Given, Upper Dentures, Lower Dentures   Pulmonary shortness of breath, asthma , sleep apnea , COPD,  COPD inhaler and oxygen dependent, neg recent URI, Current Smoker,           Cardiovascular Exercise Tolerance: Good (-) hypertension(-) angina+ CAD and + Past MI  (-) Cardiac Stents and (-) CABG Normal cardiovascular exam(-) dysrhythmias (-) Valvular Problems/Murmurs     Neuro/Psych  Headaches, neg Seizures PSYCHIATRIC DISORDERS Anxiety Depression Bipolar Disorder  Neuromuscular disease CVA, No Residual Symptoms    GI/Hepatic Neg liver ROS, GERD  ,  Endo/Other  diabetes (borderline)Morbid obesity  Renal/GU negative Renal ROS  negative genitourinary   Musculoskeletal   Abdominal   Peds  Hematology negative hematology ROS (+)   Anesthesia Other Findings Past Medical History: No date: ADHD (attention deficit hyperactivity disorder) 06/30/2015: Apnea, sleep No date: Arthritis No date: Asthma 06/30/2015: Asthma with acute exacerbation No date: Bipolar 1 disorder ([facility]) No date: COPD (chronic obstructive pulmonary disease) ([place]) 11/18/2013: COPD, moderate ([place])     Comment:  Overview:  Last Assessment & Plan:  Refer to               pulmonologist Overview:  Overview:  Last Assessment &               Plan:  Pneumonia vaccine offered and given today; she did              not tolerate LABA; will start her on Spiriva; stop               combivent neb; use only SABA neb OR inhaler, not both;               smoking  cessation urged, and I am here to help if she               wants assistance quitting; f/u in 4 weeks Last Assessment              & Plan:  Pneumonia vaccin No date: Depression 08/30/2015: GERD (gastroesophageal reflux disease) 06/30/2015: History of acute myocardial infarction     Comment:  Overview:  [facility]  08/30/2015: Low HDL (under 40) No date: MI (myocardial infarction) ([place]) No date: Pre-diabetes 05/20/2016: Prediabetes No date: PTSD (post-traumatic stress disorder) No date: Rhabdomyolysis 06/30/2015: Sleep apnea 11/18/2013: Stage 3 severe COPD by GOLD classification ([place]) No date: Stroke ([place]) 06/30/2015: Uncomplicated asthma 12/87/8676: Varicella   Reproductive/Obstetrics negative OB ROS                            Anesthesia Physical Anesthesia Plan  ASA: III  Anesthesia Plan: General   Post-op Pain Management:    Induction: Intravenous  PONV Risk Score and Plan: 2 and Propofol infusion  Airway Management Planned: Nasal Cannula  Additional Equipment:   Intra-op Plan:   Post-operative Plan:   Informed Consent: I have reviewed the patients History and Physical, chart, labs and discussed the procedure including the risks, benefits and alternatives for the proposed anesthesia with the  patient or authorized representative who has indicated his/her understanding and acceptance.   Dental Advisory Given  Plan Discussed with: Anesthesiologist, CRNA and Surgeon  Anesthesia Plan Comments:         Anesthesia Quick Evaluation

## 2017-08-07 NOTE — Transfer of Care (Signed)
Immediate Anesthesia Transfer of Care Note  Patient: Nicole Austin  Procedure(s) Performed: COLONOSCOPY WITH PROPOFOL (N/A )  Patient Location: PACU and Endoscopy Unit  Anesthesia Type:General  Level of Consciousness: awake, alert , oriented and patient cooperative  Airway & Oxygen Therapy: Patient Spontanous Breathing  Post-op Assessment: Report given to RN, Post -op Vital signs reviewed and stable and Patient moving all extremities  Post vital signs: Reviewed and stable  Last Vitals:  Vitals Value Taken Time  BP    Temp    Pulse    Resp    SpO2      Last Pain:  Vitals:   08/07/17 1237  TempSrc: Tympanic  PainSc: 0-No pain         Complications: No apparent anesthesia complications

## 2017-08-10 LAB — SURGICAL PATHOLOGY

## 2017-08-12 ENCOUNTER — Encounter: Payer: Self-pay | Admitting: Gastroenterology

## 2017-08-13 ENCOUNTER — Ambulatory Visit: Payer: Self-pay | Admitting: Obstetrics and Gynecology

## 2017-08-13 ENCOUNTER — Encounter: Payer: Self-pay | Admitting: Gastroenterology

## 2017-08-19 ENCOUNTER — Encounter: Payer: Self-pay | Admitting: Family Medicine

## 2017-08-19 ENCOUNTER — Ambulatory Visit (INDEPENDENT_AMBULATORY_CARE_PROVIDER_SITE_OTHER): Payer: Medicare Other | Admitting: Family Medicine

## 2017-08-19 VITALS — BP 122/78 | HR 92 | Temp 98.9°F | Resp 16 | Ht 61.0 in | Wt 228.3 lb

## 2017-08-19 DIAGNOSIS — F319 Bipolar disorder, unspecified: Secondary | ICD-10-CM

## 2017-08-19 DIAGNOSIS — R197 Diarrhea, unspecified: Secondary | ICD-10-CM | POA: Diagnosis not present

## 2017-08-19 DIAGNOSIS — Z720 Tobacco use: Secondary | ICD-10-CM

## 2017-08-19 NOTE — Assessment & Plan Note (Signed)
She is not ready to quit smoking; I am here to help her if/when ready to quit

## 2017-08-19 NOTE — Assessment & Plan Note (Signed)
She agrees to call 911 with any thoughts of hurting herself or others; please do call psychiatrist today

## 2017-08-19 NOTE — Progress Notes (Signed)
BP 122/78   Pulse 92   Temp 98.9 F (37.2 C) (Oral)   Resp 16   Ht 5\' 1"  (1.549 m)   Wt 228 lb 4.8 oz (103.6 kg)   LMP  (LMP Unknown)   SpO2 96%   BMI 43.14 kg/m    Subjective:    Patient ID: Nicole Austin, female    DOB: [birth year], 57 y.o.   MRN: [id number]  HPI: Nicole Austin is a 57 y.o. female  Chief Complaint  Patient presents with  . Follow-up    HPI Patient is here for f/u; see last note 07/18/2017  Her PHQ-9 score today is 23, with a score of 2 for suicidal thoughts She has been in a crappy mood for the last few weeks; her sister is "always bitchin'" She is living with her sister right now and it's not working out well; she just feels like she is paying their bills She has a chance to get her own apartment  She had a stooling accident the other day; she had to go one block away to use the bathroom; all the pipes are messed up; "I got bitched at yesterday"; sister told her she needed to go to assisted living; just couldn't get to the bathroom She is having chronic diarrhea; they removed a lot of polpys; has to have repeat colonoscopy in 6 months; she just had her colonoscopy on 08/07/2017  Last A1c was normal; no longer in prediabetes range; losing some weight; scared to eat because she is afraid she'll have to go to the bathroom  COPD; doing fine and not needing the rescue inhaler right now  Functional Status Survey: Is the patient deaf or have difficulty hearing?: No Does the patient have difficulty seeing, even when wearing glasses/contacts?: No Does the patient have difficulty concentrating, remembering, or making decisions?: No Does the patient have difficulty walking or climbing stairs?: No Does the patient have difficulty dressing or bathing?: No Does the patient have difficulty doing errands alone such as visiting a doctor's office or shopping?: No  Fall Risk  08/19/2017 07/18/2017 04/19/2017 02/13/2017 01/10/2017  Falls in the past year? No  No Yes No Yes  Comment - - - - -  Number falls in past yr: - - 1 1 2  or more  Comment - - - - -  Injury with Fall? - - No No No  Risk Factor Category  - - - - -  Risk for fall due to : - - - - -  Follow up - - - - -  Comment - - - - -     Depression screen [facility] 2/9 08/19/2017 07/18/2017 04/19/2017 02/13/2017 01/10/2017  Decreased Interest 2 0 0 3 0  Down, Depressed, Hopeless 3 0 1 3 1   PHQ - 2 Score 5 0 1 6 1   Altered sleeping 3 - - 3 -  Tired, decreased energy 3 - - 3 -  Change in appetite 3 - - 3 -  Feeling bad or failure about yourself  3 - - 3 -  Trouble concentrating 3 - - 3 -  Moving slowly or fidgety/restless 1 - - 1 -  Suicidal thoughts 2 - - 0 -  PHQ-9 Score 23 - - 22 -  Difficult doing work/chores Extremely dIfficult - - Very difficult -  Some recent data might be hidden  MD notes: no active thoughts of hurting herself; she doesn't want to die and go to hell; if the living  situation changed, she would "feel like an individual again"  Relevant past medical, surgical, family and social history reviewed Past Medical History:  Diagnosis Date  . ADHD (attention deficit hyperactivity disorder)   . Apnea, sleep 06/30/2015  . Arthritis   . Asthma   . Asthma with acute exacerbation 06/30/2015  . Bipolar 1 disorder ([place])   . COPD (chronic obstructive pulmonary disease) ([place])   . COPD, moderate ([place]) 11/18/2013   Overview:  Last Assessment & Plan:  Refer to pulmonologist Overview:  Overview:  Last Assessment & Plan:  Pneumonia vaccine offered and given today; she did not tolerate LABA; will start her on Spiriva; stop combivent neb; use only SABA neb OR inhaler, not both; smoking cessation urged, and I am here to help if she wants assistance quitting; f/u in 4 weeks Last Assessment & Plan:  Pneumonia vaccin  . Depression   . GERD (gastroesophageal reflux disease) 08/30/2015  . History of acute myocardial infarction 06/30/2015   Overview:  [facility]   . Low HDL (under 40) 08/30/2015  . MI  (myocardial infarction) ([place])   . Pre-diabetes   . Prediabetes 05/20/2016  . PTSD (post-traumatic stress disorder)   . Rhabdomyolysis   . Sleep apnea 06/30/2015  . Stage 3 severe COPD by GOLD classification ([place]) 11/18/2013  . Stroke ([place])   . Uncomplicated asthma 06/29/6142  . Varicella 02/25/2017   Past Surgical History:  Procedure Laterality Date  . BACK SURGERY    . COLONOSCOPY WITH PROPOFOL N/A 08/07/2017   Procedure: COLONOSCOPY WITH PROPOFOL;  Surgeon: Virgel Manifold, MD;  Location: [facility] ENDOSCOPY;  Service: Endoscopy;  Laterality: N/A;  . JOINT REPLACEMENT    . REPLACEMENT TOTAL KNEE Left    Family History  Problem Relation Age of Onset  . Hernia Mother   . Heart disease Mother   . OCD Mother   . Diabetes Mother   . Parkinson's disease Father   . Bipolar disorder Sister   . Schizophrenia Sister   . ADD / ADHD Sister   . Alcohol abuse Brother   . Bipolar disorder Sister   . Paranoid behavior Sister   . ADD / ADHD Sister   . ADD / ADHD Son   . Dementia Maternal Grandmother   . Emphysema Maternal Grandfather   . ADD / ADHD Son   . ADD / ADHD Son   . Depression Son    Social History   Tobacco Use  . Smoking status: Current Every Day Smoker    Packs/day: 1.00    Years: 36.00    Pack years: 36.00    Types: Cigarettes    Start date: 10/11/1984  . Smokeless tobacco: Never Used  . Tobacco comment: Rx for Nioderm  Substance Use Topics  . Alcohol use: No    Alcohol/week: 0.0 oz  . Drug use: No    Interim medical history since last visit reviewed. Allergies and medications reviewed  Review of Systems Per HPI unless specifically indicated above     Objective:    BP 122/78   Pulse 92   Temp 98.9 F (37.2 C) (Oral)   Resp 16   Ht 5\' 1"  (1.549 m)   Wt 228 lb 4.8 oz (103.6 kg)   LMP  (LMP Unknown)   SpO2 96%   BMI 43.14 kg/m   Wt Readings from Last 3 Encounters:  08/19/17 228 lb 4.8 oz (103.6 kg)  08/07/17 233 lb (105.7 kg)  07/18/17 233 lb 8 oz  (105.9 kg)  Physical Exam  Constitutional: She appears well-developed and well-nourished. No distress.  HENT:  Head: Normocephalic and atraumatic.  Eyes: EOM are normal. No scleral icterus.  Neck: No thyromegaly present.  Cardiovascular: Normal rate, regular rhythm and normal heart sounds.  No murmur heard. Pulmonary/Chest: Effort normal and breath sounds normal. No respiratory distress. She has no wheezes.  Abdominal: Soft. Bowel sounds are normal. She exhibits no distension.  Musculoskeletal: She exhibits no edema.  Neurological: She is alert. She exhibits normal muscle tone.  Skin: Skin is warm and dry. She is not diaphoretic. No pallor.  Psychiatric: She has a normal mood and affect. She expresses no suicidal plans.  Good eye contact with examiner; she believes if living situation changed, problem would be solved    Results for orders placed or performed during the hospital encounter of 08/07/17  Surgical pathology  Result Value Ref Range   SURGICAL PATHOLOGY      Surgical Pathology CASE: [id number] PATIENT: Heena Mizuno Surgical Pathology Report     SPECIMEN SUBMITTED: A. Colon polyp x 7, ascending and sigmoid; cbx and cold snare B. Colon polyp, sigmoid; hot snare  CLINICAL HISTORY: None provided  PRE-OPERATIVE DIAGNOSIS: Screening colonoscopy  POST-OPERATIVE DIAGNOSIS: None provided.     DIAGNOSIS: A.  COLON POLYPS X 7, ASCENDING; COLD BIOPSY AND COLD SNARE: - TUBULAR ADENOMAS, 3 FRAGMENTS, NEGATIVE FOR HIGH-GRADE DYSPLASIA AND MALIGNANCY. - HYPERPLASTIC POLYPS, 3 FRAGMENTS, NEGATIVE FOR DYSPLASIA AND MALIGNANCY.  B.  COLON POLYP, SIGMOID; HOT SNARE: - TUBULAR ADENOMA, 5 MM, NEGATIVE FOR HIGH-GRADE DYSPLASIA AND MALIGNANCY. - HYPERPLASTIC POLYP, 2 MM, NEGATIVE FOR DYSPLASIA AND MALIGNANCY.  Comment: The hyperplastic polyp in container B is probably the missing polyp from container A.   GROSS DESCRIPTION: A. Labeled: Ascending colon polyp  cbx/cold snare x2 cold snare sigmoid colon polyp x5 Receive d: In formalin Tissue fragment(s): Multiple Size: Aggregate, 2.2 x 1.8 x 0.2 cm Description: Tan fragments and fecal material Entirely submitted in one cassette.  B. Labeled: Sigmoid colon polyp hot snare Received: In formalin Tissue fragment(s): 2 Size: 0.3 and 0.5 cm Description: Tan polypoid fragments, largest marked blue at the base and bisected Entirely submitted in one cassette.    Final Diagnosis performed by Bryan Lemma, MD.   Electronically signed 08/10/2017 2:08:08PM The electronic signature indicates that the named Attending Pathologist has evaluated the specimen  Technical component performed at [facility], [address] [place], [place] [address] Lab: [phone] Dir: Rush Farmer, MD, MMM  Professional component performed at [facility], [facility], [place], [place], [place] [address] Lab: [phone] Dir: Dellia Nims. Rubinas, MD       Assessment & Plan:   Problem List Items Addressed This Visit      Other   Morbid obesity ([place])    Encouraged her to eat healthy and work on weight loss      Current tobacco use    She is not ready to quit smoking; I am here to help her if/when ready to quit      Bipolar disorder with depression ([place])    She agrees to call 911 with any thoughts of hurting herself or others; please do call psychiatrist today       Other Visit Diagnoses    Diarrhea, unspecified type    -  Primary   asked her to speak with GI specialist; reviewed colonoscopy; avoid sugar-free gums and mints; repeat colonoscopy in 6 months       Follow up plan: Return in about  2 weeks (around 09/02/2017) for follow-up visit with Dr. Sanda Klein.  An after-visit summary was printed and given to the patient at [facility].  Please see the patient instructions which may contain other information and recommendations beyond what is mentioned above in the assessment and plan.  Face-to-face  time with patient was more than 25 minutes, >50% time spent counseling and coordination of care

## 2017-08-19 NOTE — Patient Instructions (Addendum)
Please do call Dr. Bonna Gains about your chronic diarrhea Phone: [phone]  Please do contact your psychiatrist today about your symptoms; call him even before leaving the office  Here are some resources to help you if you feel you are in a mental health crisis:  [place] - Call [phone]  for help - Website with more resources: GripTrip.com.pt  [facility] - Call [phone] for help. - Mobile Crisis Program available 24 hours a day, 365 days a year. - Available for anyone of any age in [place].  [facility] - Address: [address], [place] - Telephone: [phone]  - Hours of Operation: Sunday - Saturday - 8:00 a.m. - 8:00 p.m. - Medicaid, Medicare (Government Issued Only), BCBS, and [place] [employer], [place], Psychiatrists on-site to provide medication management, [place], and Peer Support Care.  [facility] - Call [phone] for help. - Mobile Crisis Program available 24 hours a day, 365 days a year. - Available for anyone of any age in [place] out the information at [employer].org entitled "Nutrition for Weight Loss: What You Need to Know about Fad Diets" Try to lose between 1-2 pounds per week by taking in fewer calories and burning off more calories You can succeed by limiting portions, limiting foods dense in calories and fat, becoming more active, and drinking 8 glasses of water a day (64 ounces) Don't skip meals, especially breakfast, as skipping meals may alter your metabolism Do not use over-the-counter weight loss pills or gimmicks that claim rapid weight loss A healthy BMI (or body mass index) is between 18.5 and 24.9 You can calculate your ideal BMI at the [place] website  ClubMonetize.fr  I do encourage you to quit smoking Call [phone] to sign up for smoking cessation classes You can call 1-800-QUIT-NOW to talk with a smoking cessation coach  Steps to Quit Smoking Smoking tobacco can be bad for your health. It can also affect almost every organ in your body. Smoking puts you and people around you at risk for many serious long-lasting (chronic) diseases. Quitting smoking is hard, but it is one of the best things that you can do for your health. It is never too late to quit. What are the benefits of quitting smoking? When you quit smoking, you lower your risk for getting serious diseases and conditions. They can include:  Lung cancer or lung disease.  Heart disease.  Stroke.  Heart attack.  Not being able to have children (infertility).  Weak bones (osteoporosis) and broken bones (fractures).  If you have coughing, wheezing, and shortness of breath, those symptoms may get better when you quit. You may also get sick less often. If you are pregnant, quitting smoking can help to lower your chances of having a baby of low birth weight. What can I do to help me quit smoking? Talk with your doctor about what can help you quit smoking. Some things you can do (strategies) include:  Quitting smoking totally, instead of slowly cutting back how much you smoke over a period of time.  Going to in-person counseling. You are more likely to quit if you go to many counseling sessions.  Using resources and support systems, such as: ? Database administrator with a Social worker. ? Phone quitlines. ? Careers information officer. ? Support groups or group counseling. ? Text messaging programs. ? Mobile phone apps or applications.  Taking medicines. Some of these medicines may have nicotine in them. If you are pregnant or breastfeeding, do not take any medicines to quit smoking unless your doctor says it is okay. Talk with  your doctor about counseling or other things that can help you.  Talk with your doctor about using more than one strategy at the same time, such as taking medicines while you are also going to in-person counseling. This can help make quitting easier. What things can I do to make it easier to quit? Quitting smoking might feel very hard at first, but there is a lot that you can do to make it easier. Take these steps:  Talk to your family and friends. Ask them to support and encourage you.  Call phone quitlines, reach out to support groups, or work with a Social worker.  Ask people who smoke to not smoke around you.  Avoid places that make you want (trigger) to smoke, such as: ? Bars. ? Parties. ? Smoke-break areas at work.  Spend time with people who do not smoke.  Lower the stress in your life. Stress can make you want to smoke. Try these things to help your stress: ? Getting regular exercise. ? Deep-breathing exercises. ? Yoga. ? Meditating. ? Doing a body scan. To do this, close your eyes, focus on one area of your body at a time from head to toe, and notice which parts of your body are tense. Try to relax the muscles in those areas.  Download or buy apps on your mobile phone or tablet that can help you stick to your quit plan. There are many free apps, such as QuitGuide from the [employer] Office manager for Disease Control and Prevention). You can find more support from smokefree.gov and other websites.  This information is not intended to replace advice given to you by your health care provider. Make sure you discuss any questions you have with your health care provider. Document Released: 12/09/2008 Document Revised: 10/11/2015 Document Reviewed: 06/29/2014 Elsevier Interactive Patient Education  2018 [employer].

## 2017-08-19 NOTE — Assessment & Plan Note (Signed)
Encouraged her to eat healthy and work on weight loss

## 2017-08-26 ENCOUNTER — Other Ambulatory Visit: Payer: Self-pay | Admitting: Psychiatry

## 2017-08-26 ENCOUNTER — Ambulatory Visit: Payer: Medicare Other | Admitting: Gastroenterology

## 2017-08-30 ENCOUNTER — Ambulatory Visit: Payer: Self-pay | Admitting: Obstetrics and Gynecology

## 2017-09-03 ENCOUNTER — Ambulatory Visit: Payer: Medicare Other | Admitting: Gastroenterology

## 2017-09-04 DIAGNOSIS — J449 Chronic obstructive pulmonary disease, unspecified: Secondary | ICD-10-CM | POA: Diagnosis not present

## 2017-09-05 ENCOUNTER — Ambulatory Visit: Payer: Self-pay | Admitting: Family Medicine

## 2017-09-12 ENCOUNTER — Other Ambulatory Visit: Payer: Self-pay | Admitting: Family Medicine

## 2017-09-14 ENCOUNTER — Other Ambulatory Visit: Payer: Self-pay | Admitting: Family Medicine

## 2017-09-17 ENCOUNTER — Ambulatory Visit: Payer: Medicaid Other | Admitting: Obstetrics and Gynecology

## 2017-09-17 ENCOUNTER — Ambulatory Visit: Payer: Self-pay

## 2017-09-23 ENCOUNTER — Ambulatory Visit: Payer: Self-pay | Admitting: Family Medicine

## 2017-09-23 ENCOUNTER — Other Ambulatory Visit: Payer: Self-pay | Admitting: Family Medicine

## 2017-10-04 ENCOUNTER — Encounter: Payer: Self-pay | Admitting: Family Medicine

## 2017-10-04 ENCOUNTER — Ambulatory Visit (INDEPENDENT_AMBULATORY_CARE_PROVIDER_SITE_OTHER): Payer: Medicare Other | Admitting: Family Medicine

## 2017-10-04 VITALS — BP 100/60 | HR 105 | Temp 98.1°F | Resp 16 | Ht 61.0 in | Wt 223.0 lb

## 2017-10-04 DIAGNOSIS — R2689 Other abnormalities of gait and mobility: Secondary | ICD-10-CM

## 2017-10-04 DIAGNOSIS — Z5181 Encounter for therapeutic drug level monitoring: Secondary | ICD-10-CM

## 2017-10-04 DIAGNOSIS — F319 Bipolar disorder, unspecified: Secondary | ICD-10-CM

## 2017-10-04 DIAGNOSIS — R251 Tremor, unspecified: Secondary | ICD-10-CM

## 2017-10-04 DIAGNOSIS — E782 Mixed hyperlipidemia: Secondary | ICD-10-CM

## 2017-10-04 DIAGNOSIS — R5383 Other fatigue: Secondary | ICD-10-CM | POA: Diagnosis not present

## 2017-10-04 DIAGNOSIS — R002 Palpitations: Secondary | ICD-10-CM | POA: Diagnosis not present

## 2017-10-04 DIAGNOSIS — K529 Noninfective gastroenteritis and colitis, unspecified: Secondary | ICD-10-CM | POA: Diagnosis not present

## 2017-10-04 DIAGNOSIS — F3132 Bipolar disorder, current episode depressed, moderate: Secondary | ICD-10-CM

## 2017-10-04 NOTE — Assessment & Plan Note (Signed)
Will check B12

## 2017-10-04 NOTE — Progress Notes (Signed)
BP 100/60 (BP Location: Right Arm, Patient Position: Sitting, Cuff Size: Large)   Pulse (!) 105   Temp 98.1 F (36.7 C) (Oral)   Resp 16   Ht 5\' 1"  (1.549 m)   Wt 223 lb (101.2 kg)   LMP  (LMP Unknown)   SpO2 96%   BMI 42.14 kg/m    Subjective:    Patient ID: Nicole Austin, female    DOB: [birth year], 57 y.o.   MRN: [id number]  HPI: Nicole Austin is a 57 y.o. female  Chief Complaint  Patient presents with  . Diarrhea  . Tremors    HPI She is going to see Dr. Manuella Ghazi on August 19th; feels like she is going to jump out of her skin; not sure if tremors or the heat and no air conditioning  Worried about gaiing her weight back; she is down 10 pounds since June; not hungry with the heat lately; drinking enough water, but more iced tea (sweetened); we discussed the calories right away  Diarrhea; still got it; appt with GI on Tuesday; seeing clear slimy stuff; thinks she has some bleeding from hemorrhoids, or if just raw from all the wiping; going maybe 3x a day; urgency to it; previously all 7 on the [facility] scale, now mostly 6 and occasionally 7; sometimes foods will be a trigger; sister uses spices  Depression; reviewed PHQ-9; treated by Dr. Kasandra Knudsen; sees him August 26th; seeing this psychiatrist for years and years, then switched for insurance purposes, then back with him for at least six months; she feels confident with him; she has not had thyroid checked in a while; not sure about vit D, but spending time in the sun; not vegetarian  Depression screen [facility] 2/9 10/04/2017 08/19/2017 07/18/2017 04/19/2017 02/13/2017  Decreased Interest 3 2 0 0 3  Down, Depressed, Hopeless 3 3 0 1 3  PHQ - 2 Score 6 5 0 1 6  Altered sleeping 3 3 - - 3  Tired, decreased energy 3 3 - - 3  Change in appetite 3 3 - - 3  Feeling bad or failure about yourself  3 3 - - 3  Trouble concentrating 3 3 - - 3  Moving slowly or fidgety/restless 3 1 - - 1  Suicidal thoughts 2 2 - - 0  PHQ-9 Score 26 23 - -  22  Difficult doing work/chores Somewhat difficult Extremely dIfficult - - Very difficult  Some recent data might be hidden    Relevant past medical, surgical, family and social history reviewed Past Medical History:  Diagnosis Date  . ADHD (attention deficit hyperactivity disorder)   . Apnea, sleep 06/30/2015  . Arthritis   . Asthma   . Asthma with acute exacerbation 06/30/2015  . Bipolar 1 disorder ([place])   . COPD (chronic obstructive pulmonary disease) ([place])   . COPD, moderate ([place]) 11/18/2013   Overview:  Last Assessment & Plan:  Refer to pulmonologist Overview:  Overview:  Last Assessment & Plan:  Pneumonia vaccine offered and given today; she did not tolerate LABA; will start her on Spiriva; stop combivent neb; use only SABA neb OR inhaler, not both; smoking cessation urged, and I am here to help if she wants assistance quitting; f/u in 4 weeks Last Assessment & Plan:  Pneumonia vaccin  . Depression   . GERD (gastroesophageal reflux disease) 08/30/2015  . History of acute myocardial infarction 06/30/2015   Overview:  [facility]   . Low HDL (under 40) 08/30/2015  .  MI (myocardial infarction) ([place])   . Pre-diabetes   . Prediabetes 05/20/2016  . PTSD (post-traumatic stress disorder)   . Rhabdomyolysis   . Sleep apnea 06/30/2015  . Stage 3 severe COPD by GOLD classification ([place]) 11/18/2013  . Stroke ([place])   . Uncomplicated asthma 03/04/7937  . Varicella 02/25/2017   Past Surgical History:  Procedure Laterality Date  . BACK SURGERY    . COLONOSCOPY WITH PROPOFOL N/A 08/07/2017   Procedure: COLONOSCOPY WITH PROPOFOL;  Surgeon: Virgel Manifold, MD;  Location: [facility] ENDOSCOPY;  Service: Endoscopy;  Laterality: N/A;  . JOINT REPLACEMENT    . REPLACEMENT TOTAL KNEE Left    Family History  Problem Relation Age of Onset  . Hernia Mother   . Heart disease Mother   . OCD Mother   . Diabetes Mother   . Parkinson's disease Father   . Bipolar disorder Sister   . Schizophrenia Sister   . ADD / ADHD  Sister   . Alcohol abuse Brother   . Bipolar disorder Sister   . Paranoid behavior Sister   . ADD / ADHD Sister   . ADD / ADHD Son   . Dementia Maternal Grandmother   . Emphysema Maternal Grandfather   . ADD / ADHD Son   . ADD / ADHD Son   . Depression Son    Social History   Tobacco Use  . Smoking status: Current Every Day Smoker    Packs/day: 1.00    Years: 36.00    Pack years: 36.00    Types: Cigarettes    Start date: 10/11/1984  . Smokeless tobacco: Never Used  . Tobacco comment: Rx for Nioderm  Substance Use Topics  . Alcohol use: No    Alcohol/week: 0.0 standard drinks  . Drug use: No    Interim medical history since last visit reviewed. Allergies and medications reviewed  Review of Systems Per HPI unless specifically indicated above     Objective:    BP 100/60 (BP Location: Right Arm, Patient Position: Sitting, Cuff Size: Large)   Pulse (!) 105   Temp 98.1 F (36.7 C) (Oral)   Resp 16   Ht 5\' 1"  (1.549 m)   Wt 223 lb (101.2 kg)   LMP  (LMP Unknown)   SpO2 96%   BMI 42.14 kg/m   Wt Readings from Last 3 Encounters:  10/08/17 226 lb 9.6 oz (102.8 kg)  10/04/17 223 lb (101.2 kg)  08/19/17 228 lb 4.8 oz (103.6 kg)    Physical Exam  Constitutional: She appears well-developed and well-nourished. No distress.  Morbidly obese, but down 10 pounds over last 2- months  HENT:  Head: Normocephalic and atraumatic.  Eyes: EOM are normal. No scleral icterus.  Neck: No thyromegaly present.  Cardiovascular: Normal rate, regular rhythm and normal heart sounds.  No murmur heard. Pulmonary/Chest: Effort normal and breath sounds normal. No respiratory distress. She has no wheezes.  Abdominal: Soft. Bowel sounds are normal. She exhibits no distension.  Musculoskeletal: She exhibits no edema.  Neurological: She is alert.  Skin: Skin is warm and dry. She is not diaphoretic. No pallor.  Psychiatric: She has a normal mood and affect. Her behavior is normal. Judgment and  thought content normal.       Assessment & Plan:   Problem List Items Addressed This Visit      Other   Fatigue   Relevant Orders   Vitamin B12 (Completed)   Medication monitoring encounter   Relevant Orders  CBC with Differential/Platelet (Completed)   COMPLETE METABOLIC PANEL WITH GFR (Completed)   Hyperlipidemia, unspecified   Relevant Orders   Lipid panel (Completed)   Balance problem    Will check B12      Bipolar disorder with depression ([place])    Seeing psychiatrist, no SI/HI      Relevant Orders   Vitamin B12 (Completed)   Bipolar affective disorder, current episode depressed ([place])    Seeing psychiatrist       Other Visit Diagnoses    Palpitation    -  Primary   Relevant Orders   TSH (Completed)   T4, free (Completed)   T3, free (Completed)   Chronic diarrhea       Relevant Orders   CBC with Differential/Platelet (Completed)   COMPLETE METABOLIC PANEL WITH GFR (Completed)   Magnesium (Completed)   Tremor of both hands       will be seeing neurologist       Follow up plan: No follow-ups on file.  An after-visit summary was printed and given to the patient at [facility].  Please see the patient instructions which may contain other information and recommendations beyond what is mentioned above in the assessment and plan.  No orders of the defined types were placed in this encounter.   Orders Placed This Encounter  Procedures  . TSH  . T4, free  . T3, free  . Vitamin B12  . CBC with Differential/Platelet  . COMPLETE METABOLIC PANEL WITH GFR  . Lipid panel  . Magnesium

## 2017-10-05 DIAGNOSIS — J449 Chronic obstructive pulmonary disease, unspecified: Secondary | ICD-10-CM | POA: Diagnosis not present

## 2017-10-05 LAB — COMPLETE METABOLIC PANEL WITH GFR
AG Ratio: 1.6 (calc) (ref 1.0–2.5)
ALKALINE PHOSPHATASE (APISO): 90 U/L (ref 33–130)
ALT: 10 U/L (ref 6–29)
AST: 15 U/L (ref 10–35)
Albumin: 4.2 g/dL (ref 3.6–5.1)
BUN/Creatinine Ratio: 7 (calc) (ref 6–22)
BUN: 8 mg/dL (ref 7–25)
CO2: 28 mmol/L (ref 20–32)
CREATININE: 1.16 mg/dL — AB (ref 0.50–1.05)
Calcium: 9.6 mg/dL (ref 8.6–10.4)
Chloride: 100 mmol/L (ref 98–110)
GFR, EST NON AFRICAN AMERICAN: 52 mL/min/{1.73_m2} — AB (ref >=60)
GFR, Est African American: 60 mL/min/{1.73_m2} (ref >=60)
GLOBULIN: 2.7 g/dL (ref 1.9–3.7)
GLUCOSE: 83 mg/dL (ref 65–139)
Potassium: 4.1 mmol/L (ref 3.5–5.3)
Sodium: 137 mmol/L (ref 135–146)
Total Bilirubin: 0.7 mg/dL (ref 0.2–1.2)
Total Protein: 6.9 g/dL (ref 6.1–8.1)

## 2017-10-05 LAB — CBC WITH DIFFERENTIAL/PLATELET
Basophils Absolute: 64 cells/uL (ref 0–200)
Basophils Relative: 0.7 %
EOS PCT: 9.6 %
Eosinophils Absolute: 883 cells/uL — ABNORMAL HIGH (ref 15–500)
HEMATOCRIT: 43.3 % (ref 35.0–45.0)
Hemoglobin: 15.2 g/dL (ref 11.7–15.5)
LYMPHS ABS: 3312 {cells}/uL (ref 850–3900)
MCH: 31.4 pg (ref 27.0–33.0)
MCHC: 35.1 g/dL (ref 32.0–36.0)
MCV: 89.5 fL (ref 80.0–100.0)
MONOS PCT: 7.6 %
MPV: 11.7 fL (ref 7.5–12.5)
NEUTROS ABS: 4241 {cells}/uL (ref 1500–7800)
NEUTROS PCT: 46.1 %
Platelets: 260 10*3/uL (ref 140–400)
RBC: 4.84 10*6/uL (ref 3.80–5.10)
RDW: 12.8 % (ref 11.0–15.0)
Total Lymphocyte: 36 %
WBC mixed population: 699 cells/uL (ref 200–950)
WBC: 9.2 10*3/uL (ref 3.8–10.8)

## 2017-10-05 LAB — LIPID PANEL
CHOL/HDL RATIO: 3.8 (calc) (ref <5.0)
CHOLESTEROL: 125 mg/dL (ref <200)
HDL: 33 mg/dL — AB (ref >50)
LDL Cholesterol (Calc): 63 mg/dL (calc)
NON-HDL CHOLESTEROL (CALC): 92 mg/dL (ref <130)
TRIGLYCERIDES: 236 mg/dL — AB (ref <150)

## 2017-10-05 LAB — TSH: TSH: 0.66 m[IU]/L (ref 0.40–4.50)

## 2017-10-05 LAB — T4, FREE: Free T4: 1.1 ng/dL (ref 0.8–1.8)

## 2017-10-05 LAB — MAGNESIUM: Magnesium: 1.7 mg/dL (ref 1.5–2.5)

## 2017-10-05 LAB — VITAMIN B12: VITAMIN B 12: 281 pg/mL (ref 200–1100)

## 2017-10-05 LAB — T3, FREE: T3, Free: 2.6 pg/mL (ref 2.3–4.2)

## 2017-10-08 ENCOUNTER — Ambulatory Visit (INDEPENDENT_AMBULATORY_CARE_PROVIDER_SITE_OTHER): Payer: Medicare Other

## 2017-10-08 ENCOUNTER — Encounter: Payer: Self-pay | Admitting: Gastroenterology

## 2017-10-08 ENCOUNTER — Ambulatory Visit (INDEPENDENT_AMBULATORY_CARE_PROVIDER_SITE_OTHER): Payer: Medicare Other | Admitting: Gastroenterology

## 2017-10-08 VITALS — BP 107/71 | HR 66 | Ht 61.0 in | Wt 226.6 lb

## 2017-10-08 DIAGNOSIS — R197 Diarrhea, unspecified: Secondary | ICD-10-CM

## 2017-10-08 DIAGNOSIS — E538 Deficiency of other specified B group vitamins: Secondary | ICD-10-CM

## 2017-10-08 MED ORDER — CYANOCOBALAMIN 1000 MCG/ML IJ SOLN
1000.0000 ug | Freq: Once | INTRAMUSCULAR | Status: AC
  Administered 2017-10-08: 1000 ug via INTRAMUSCULAR

## 2017-10-08 NOTE — Progress Notes (Signed)
[facility] [place]  [place], [place] [address]  Main: [phone]  Fax: [phone]   Gastroenterology Consultation  Referring Provider:     Arnetha Courser, MD Primary Care Physician:  Arnetha Courser, MD Primary Gastroenterologist:  Dr. Vonda Antigua Reason for Consultation:     Diarrhea        HPI:   CC: Diarrhea  Nicole Austin is a 57 y.o. y/o female referred for consultation & management  by Dr. Arnetha Courser, MD.  Patient with history of bipolar disorder, reports loose stools since February 2019.  States has a loose bowel movement in the morning, but does not feel completely evacuated, and has to go shortly after that again.  No blood in stool.  No weight loss.  Has 3-4 loose bowel movements a day.  No abdominal pain, nausea vomiting, heartburn, dysphagia.  Had a screening colonoscopy in June 2019, with 8 total polyps removed, largest one being 7 mm.  Colon mucosa was otherwise normal, no reports of inflammation or ulceration present.  Pathology showed tubular adenoma and hyperplastic polyps.  Repeat recommended in 6 months with a 2-day prep due to poor prep on last exam.  Past Medical History:  Diagnosis Date  . ADHD (attention deficit hyperactivity disorder)   . Apnea, sleep 06/30/2015  . Arthritis   . Asthma   . Asthma with acute exacerbation 06/30/2015  . Bipolar 1 disorder ([place])   . COPD (chronic obstructive pulmonary disease) ([place])   . COPD, moderate ([place]) 11/18/2013   Overview:  Last Assessment & Plan:  Refer to pulmonologist Overview:  Overview:  Last Assessment & Plan:  Pneumonia vaccine offered and given today; she did not tolerate LABA; will start her on Spiriva; stop combivent neb; use only SABA neb OR inhaler, not both; smoking cessation urged, and I am here to help if she wants assistance quitting; f/u in 4 weeks Last Assessment & Plan:  Pneumonia vaccin  . Depression   . GERD (gastroesophageal reflux disease) 08/30/2015    . History of acute myocardial infarction 06/30/2015   Overview:  [facility]   . Low HDL (under 40) 08/30/2015  . MI (myocardial infarction) ([place])   . Pre-diabetes   . Prediabetes 05/20/2016  . PTSD (post-traumatic stress disorder)   . Rhabdomyolysis   . Sleep apnea 06/30/2015  . Stage 3 severe COPD by GOLD classification ([place]) 11/18/2013  . Stroke ([place])   . Uncomplicated asthma 03/01/9700  . Varicella 02/25/2017    Past Surgical History:  Procedure Laterality Date  . BACK SURGERY    . COLONOSCOPY WITH PROPOFOL N/A 08/07/2017   Procedure: COLONOSCOPY WITH PROPOFOL;  Surgeon: Virgel Manifold, MD;  Location: [facility] ENDOSCOPY;  Service: Endoscopy;  Laterality: N/A;  . JOINT REPLACEMENT    . REPLACEMENT TOTAL KNEE Left     Prior to Admission medications   Medication Sig Start Date End Date Taking? Authorizing Provider  acetaminophen (TYLENOL) 500 MG tablet Take 500 mg by mouth every 6 (six) hours as needed.    [provider]  albuterol (PROVENTIL HFA;VENTOLIN HFA) 108 (90 Base) MCG/ACT inhaler Inhale 2 puffs into the lungs every 6 (six) hours as needed for wheezing or shortness of breath. 05/31/15   Orbie Pyo, MD  albuterol (PROVENTIL) (2.5 MG/3ML) 0.083% nebulizer solution Take 3 mLs (2.5 mg total) every 4 (four) hours as needed by nebulization for wheezing or shortness of breath. 01/05/17   Nicholes Mango, MD  Alpha-D-Galactosidase (  BEANO PO) Take 1 tablet by mouth as needed.    [provider]  ALPRAZolam Duanne Moron) 0.5 MG tablet Take 0.5 mg 3 (three) times daily as needed by mouth for anxiety or sleep.    [provider]  aspirin-acetaminophen-caffeine (EXCEDRIN MIGRAINE) [phone] MG tablet Take by mouth every 6 (six) hours as needed for headache.    [provider]  atorvastatin (LIPITOR) 40 MG tablet TAKE 1 TABLET BY MOUTH AT BEDTIME 06/11/17   Lada, Satira Anis, MD  divalproex (DEPAKOTE) 250 MG DR tablet Take 1 tablet (250 mg total) by mouth 2 (two)  times daily. 11/19/16   Rainey Pines, MD  escitalopram (LEXAPRO) 20 MG tablet Take 1 tablet (20 mg total) by mouth daily. 11/19/16   Rainey Pines, MD  furosemide (LASIX) 20 MG tablet TAKE 1 TABLET BY MOUTH EVERY 3 DAYS FOR SWELLING. 09/14/17   Lada, Satira Anis, MD  ipratropium-albuterol (DUONEB) 0.5-2.5 (3) MG/3ML SOLN Take 3 mLs every 6 (six) hours by nebulization. 01/05/17   Gouru, Illene Silver, MD  lurasidone (LATUDA) 40 MG TABS tablet Take 60 mg by mouth every evening.     [provider]  Misc. Devices (BARIATRIC ROLLATOR) MISC For use when walking; dx: degenerative changes of spine, OA of knee; morbid obesity, COPD 05/30/17   Lada, Satira Anis, MD  Multiple Vitamin (MULTIVITAMIN WITH MINERALS) TABS tablet Take 1 tablet by mouth daily.    [provider]  potassium chloride (K-DUR) 10 MEQ tablet TAKE 1 TABLET BY MOUTH EVERY THREE DAYS 05/02/17   Arnetha Courser, MD  promethazine (PHENERGAN) 25 MG tablet Take 1 tablet (25 mg total) by mouth every 8 (eight) hours as needed for nausea or vomiting. 04/19/17   Lada, Satira Anis, MD  Respiratory Therapy Supplies (NEBULIZER COMPRESSOR) KIT One nebulizer kit/machine with tubing; for use with nebulized albuterol 02/10/16   Lada, Satira Anis, MD  umeclidinium-vilanterol (ANORO ELLIPTA) 62.5-25 MCG/INH AEPB Inhale 1 puff into the lungs daily. (this replaces the spiriva) 02/13/17   Lada, Satira Anis, MD    Family History  Problem Relation Age of Onset  . Hernia Mother   . Heart disease Mother   . OCD Mother   . Diabetes Mother   . Parkinson's disease Father   . Bipolar disorder Sister   . Schizophrenia Sister   . ADD / ADHD Sister   . Alcohol abuse Brother   . Bipolar disorder Sister   . Paranoid behavior Sister   . ADD / ADHD Sister   . ADD / ADHD Son   . Dementia Maternal Grandmother   . Emphysema Maternal Grandfather   . ADD / ADHD Son   . ADD / ADHD Son   . Depression Son      Social History   Tobacco Use  . Smoking status: Current Every  Day Smoker    Packs/day: 1.00    Years: 36.00    Pack years: 36.00    Types: Cigarettes    Start date: 10/11/1984  . Smokeless tobacco: Never Used  . Tobacco comment: Rx for Nioderm  Substance Use Topics  . Alcohol use: No    Alcohol/week: 0.0 standard drinks  . Drug use: No    Allergies as of 10/08/2017 - Review Complete 10/04/2017  Allergen Reaction Noted  . Chantix [varenicline] Nausea Only 02/07/2016  . Codeine Nausea And Vomiting 09/23/2014    Review of Systems:    All systems reviewed and negative except where noted in HPI.   Physical Exam:  LMP  (LMP Unknown)  No LMP recorded (lmp unknown). Patient is postmenopausal.  Vitals:   10/08/17 1437  BP: 107/71  Pulse: 66  Weight: 226 lb 9.6 oz (102.8 kg)  Height: _0  (1.549 m)    Psych:  Alert and cooperative. Normal mood and affect. General:   Alert,  Well-developed, well-nourished, pleasant and cooperative in NAD Head:  Normocephalic and atraumatic. Eyes:  Sclera clear, no icterus.   Conjunctiva pink. Ears:  Normal auditory acuity. Nose:  No deformity, discharge, or lesions. Mouth:  No deformity or lesions,oropharynx pink & moist. Neck:  Supple; no masses or thyromegaly. Lungs:  Respirations even and unlabored.  Clear throughout to auscultation.   No wheezes, crackles, or rhonchi. No acute distress. Heart:  Regular rate and rhythm; no murmurs, clicks, rubs, or gallops. Abdomen:  Normal bowel sounds.  No bruits.  Soft, non-tender and non-distended without masses, hepatosplenomegaly or hernias noted.  No guarding or rebound tenderness.    Msk:  Symmetrical without gross deformities. Good, equal movement & strength bilaterally. Pulses:  Normal pulses noted. Extremities:  No clubbing or edema.  No cyanosis. Neurologic:  Alert and oriented x3;  grossly normal neurologically. Skin:  Intact without significant lesions or rashes. No jaundice. Lymph Nodes:  No significant cervical adenopathy. Psych:  Alert and  cooperative. Normal mood and affect.   Labs: CBC    Component Value Date/Time   WBC 9.2 10/04/2017 1536   RBC 4.84 10/04/2017 1536   HGB 15.2 10/04/2017 1536   HGB 10.9 (L) 06/15/2014 0625   HCT 43.3 10/04/2017 1536   HCT 32.9 (L) 06/15/2014 0625   PLT 260 10/04/2017 1536   PLT 219 06/15/2014 0625   MCV 89.5 10/04/2017 1536   MCV 92 06/15/2014 0625   MCH 31.4 10/04/2017 1536   MCHC 35.1 10/04/2017 1536   RDW 12.8 10/04/2017 1536   RDW 15.0 (H) 06/15/2014 0625   LYMPHSABS 3,312 10/04/2017 1536   LYMPHSABS 1.5 06/15/2014 0625   MONOABS 0.6 10/06/2015 1552   MONOABS 0.7 06/15/2014 0625   EOSABS 883 (H) 10/04/2017 1536   EOSABS 0.2 06/15/2014 0625   BASOSABS 64 10/04/2017 1536   BASOSABS 0.0 06/15/2014 0625   CMP     Component Value Date/Time   NA 137 10/04/2017 1536   NA 136 06/15/2014 0625   K 4.1 10/04/2017 1536   K 3.3 (L) 06/16/2014 0744   CL 100 10/04/2017 1536   CL 105 06/15/2014 0625   CO2 28 10/04/2017 1536   CO2 24 06/15/2014 0625   GLUCOSE 83 10/04/2017 1536   GLUCOSE 92 06/15/2014 0625   BUN 8 10/04/2017 1536   BUN 12 06/15/2014 0625   CREATININE 1.16 (H) 10/04/2017 1536   CALCIUM 9.6 10/04/2017 1536   CALCIUM 8.1 (L) 06/15/2014 0625   PROT 6.9 10/04/2017 1536   PROT 5.5 (L) 06/15/2014 0625   ALBUMIN 3.9 01/10/2017 1224   ALBUMIN 2.6 (L) 06/15/2014 0625   AST 15 10/04/2017 1536   AST 40 06/15/2014 0625   ALT 10 10/04/2017 1536   ALT 32 06/15/2014 0625   ALKPHOS 94 01/10/2017 1224   ALKPHOS 73 06/15/2014 0625   BILITOT 0.7 10/04/2017 1536   BILITOT 0.7 06/15/2014 0625   GFRNONAA 52 (L) 10/04/2017 1536   GFRAA 60 10/04/2017 1536    Imaging Studies: No results found.  Assessment and Plan:   Nicole Austin is a 57 y.o. y/o female has been referred for diarrhea  Patient reports feeling of incomplete  evacuation after a bowel movement in the mornings Possibly postobstructive diarrhea We will obtain stool studies to rule out infection,  fat malabsorption, and fecal calprotectin as well Recent colonoscopy in June 2019 for screening is reassuring, his colon mucosa was normal except for the polyps removed Patient was asked to start taking Metamucil to help bulk stool and she verbalized understanding If above measures do not help, can consider repeat colonoscopy sooner than 6 months  Lab work is otherwise reassuring, with normal MCV and hemoglobin   Dr Vonda Antigua

## 2017-10-09 ENCOUNTER — Ambulatory Visit: Payer: Self-pay

## 2017-10-13 NOTE — Assessment & Plan Note (Signed)
Seeing psychiatrist 

## 2017-10-13 NOTE — Assessment & Plan Note (Signed)
Seeing psychiatrist, no SI/HI

## 2017-10-14 ENCOUNTER — Telehealth: Payer: Self-pay

## 2017-10-14 NOTE — Telephone Encounter (Signed)
Copied from [place] [id number]. Topic: Inquiry >> Oct 14, 2017 12:52 PM Nils Flack, Marland Kitchen wrote: Reason for CRM: pt is calling to find out the phone number to the [facility] homeless shelter in [place]. She says that you gave her this info previously.  Cb is [phone]

## 2017-10-15 NOTE — Telephone Encounter (Signed)
I also left informational VM.

## 2017-10-15 NOTE — Telephone Encounter (Signed)
LVM with the information of the [facility] phone number [phone].

## 2017-11-05 DIAGNOSIS — J449 Chronic obstructive pulmonary disease, unspecified: Secondary | ICD-10-CM | POA: Diagnosis not present

## 2017-11-14 DIAGNOSIS — R259 Unspecified abnormal involuntary movements: Secondary | ICD-10-CM | POA: Diagnosis not present

## 2017-11-14 DIAGNOSIS — R0683 Snoring: Secondary | ICD-10-CM | POA: Diagnosis not present

## 2017-11-15 ENCOUNTER — Other Ambulatory Visit: Payer: Self-pay | Admitting: Neurology

## 2017-11-15 DIAGNOSIS — G252 Other specified forms of tremor: Secondary | ICD-10-CM

## 2017-11-15 DIAGNOSIS — R259 Unspecified abnormal involuntary movements: Principal | ICD-10-CM

## 2017-11-30 ENCOUNTER — Ambulatory Visit: Payer: Medicare Other | Attending: Neurology

## 2017-12-05 DIAGNOSIS — J449 Chronic obstructive pulmonary disease, unspecified: Secondary | ICD-10-CM | POA: Diagnosis not present

## 2017-12-30 ENCOUNTER — Other Ambulatory Visit: Payer: Self-pay | Admitting: Family Medicine

## 2017-12-30 DIAGNOSIS — Z79899 Other long term (current) drug therapy: Secondary | ICD-10-CM | POA: Diagnosis not present

## 2017-12-30 DIAGNOSIS — Z720 Tobacco use: Secondary | ICD-10-CM | POA: Diagnosis not present

## 2018-01-07 ENCOUNTER — Ambulatory Visit: Payer: Self-pay

## 2018-01-07 ENCOUNTER — Ambulatory Visit: Payer: Self-pay | Admitting: Family Medicine

## 2018-01-30 ENCOUNTER — Ambulatory Visit: Payer: Self-pay

## 2018-01-30 ENCOUNTER — Ambulatory Visit: Payer: Self-pay | Admitting: Family Medicine

## 2018-02-03 ENCOUNTER — Ambulatory Visit: Payer: Self-pay | Admitting: Family Medicine

## 2018-02-04 ENCOUNTER — Ambulatory Visit: Payer: Self-pay | Admitting: Family Medicine

## 2018-02-11 ENCOUNTER — Ambulatory Visit: Payer: Self-pay | Admitting: Family Medicine

## 2018-02-27 ENCOUNTER — Ambulatory Visit (INDEPENDENT_AMBULATORY_CARE_PROVIDER_SITE_OTHER): Payer: Medicare Other

## 2018-02-27 ENCOUNTER — Ambulatory Visit (INDEPENDENT_AMBULATORY_CARE_PROVIDER_SITE_OTHER): Payer: Medicare Other | Admitting: Family Medicine

## 2018-02-27 ENCOUNTER — Encounter: Payer: Self-pay | Admitting: Family Medicine

## 2018-02-27 VITALS — BP 138/74 | HR 91 | Temp 98.5°F | Resp 18 | Ht 61.0 in | Wt 220.3 lb

## 2018-02-27 VITALS — BP 138/74 | HR 91 | Temp 98.5°F | Ht 61.0 in | Wt 220.3 lb

## 2018-02-27 DIAGNOSIS — R6883 Chills (without fever): Secondary | ICD-10-CM | POA: Diagnosis not present

## 2018-02-27 DIAGNOSIS — B349 Viral infection, unspecified: Secondary | ICD-10-CM

## 2018-02-27 DIAGNOSIS — Z748 Other problems related to care provider dependency: Secondary | ICD-10-CM | POA: Diagnosis not present

## 2018-02-27 DIAGNOSIS — R7303 Prediabetes: Secondary | ICD-10-CM

## 2018-02-27 DIAGNOSIS — R05 Cough: Secondary | ICD-10-CM

## 2018-02-27 DIAGNOSIS — E786 Lipoprotein deficiency: Secondary | ICD-10-CM | POA: Diagnosis not present

## 2018-02-27 DIAGNOSIS — Z599 Problem related to housing and economic circumstances, unspecified: Secondary | ICD-10-CM | POA: Diagnosis not present

## 2018-02-27 DIAGNOSIS — Z0000 Encounter for general adult medical examination without abnormal findings: Secondary | ICD-10-CM

## 2018-02-27 DIAGNOSIS — J449 Chronic obstructive pulmonary disease, unspecified: Secondary | ICD-10-CM

## 2018-02-27 DIAGNOSIS — R059 Cough, unspecified: Secondary | ICD-10-CM

## 2018-02-27 DIAGNOSIS — Z720 Tobacco use: Secondary | ICD-10-CM

## 2018-02-27 LAB — POCT INFLUENZA A/B
INFLUENZA A, POC: NEGATIVE
Influenza B, POC: NEGATIVE

## 2018-02-27 NOTE — Assessment & Plan Note (Signed)
Encouraged smoking; transportation is an issue so she cannot attent classes at the hospital, but can use QUIT-NOW services, number given

## 2018-02-27 NOTE — Assessment & Plan Note (Signed)
Doing well even with this illness; not seeing pulmonologist

## 2018-02-27 NOTE — Assessment & Plan Note (Signed)
Praise given for weight loss; offered nutritionist referral; nutritional counseling given today; limit the sweet tea; try to drink more water; limit portions

## 2018-02-27 NOTE — Progress Notes (Signed)
BP 138/74   Pulse 91   Temp 98.5 F (36.9 C) (Oral)   Ht 5\' 1"  (1.549 m)   Wt 220 lb 4.8 oz (99.9 kg)   LMP  (LMP Unknown)   SpO2 95%   BMI 41.63 kg/m    Subjective:    Patient ID: Nicole Austin, female    DOB: 03-19-1960, 58 y.o.   MRN: [id number]  HPI: Jodell Weitman is a 58 y.o. female  Chief Complaint  Patient presents with  . Follow-up  . URI    onset 6 days symptoms include: cold chills, cough runny, nasal congestion, sore throat and headaches    HPI Patient is here for f/u  She says it started with her granddaughter who had the flu; then patient's sister got sick, then patient got sick Patient started with aching and sneezing and coughing and sore throat; she just feels miserable She has been using OTC dayquil and nyquil Eating a lot of cough drops; still eating some, but stomach upset; little bit of diarrhea, almost dry heaves, but no outright vomiting; lots of coughing, no phlegm She has not had the flu shot yet this year She is getting an apartment soon, getting transportation  COPD; lungs have actually been okay with no extra wheezing with this illness; not seeing pulmonologist; not on oxygen; still smoking; about 3/4 ppd; down from 1 ppd  Obesity; losing weight even with the holidays; using artificial sweeteners in iced tea; not drinking much water  Recurrent depression; seeing Dr. Kasandra Knudsen in [place]; she is very pleased with his service; he gave her a new medicine; he upped her Xanax  Dr. Manuella Ghazi diagnosed her with Parkinsons; saw him in September; runs in the family, biological dad had it (he is deceased); did not meet him until she was 78 and he was in her 66s maybe  Vitamin B12 deficiency; taking the medicine; helping energy until the illness  CKD stage 3; was using NSAID when staying with sister when she was sick; at home she takes excedrin for headaches; just with the illness  High TG; not many fried foods; some cheese; some fatty  meats  Prediabetes; not much dry mouth; A1c was 5.7 in Jan 2018 and Jan 2017  Due for pap smear; does not want female doctor  Depression screen [facility] 2/9 02/27/2018 10/04/2017 08/19/2017 07/18/2017 04/19/2017  Decreased Interest 0 3 2 0 0  Down, Depressed, Hopeless 1 3 3  0 1  PHQ - 2 Score 1 6 5  0 1  Altered sleeping 0 3 3 - -  Tired, decreased energy 1 3 3  - -  Change in appetite 1 3 3  - -  Feeling bad or failure about yourself  0 3 3 - -  Trouble concentrating 2 3 3  - -  Moving slowly or fidgety/restless 0 3 1 - -  Suicidal thoughts 0 2 2 - -  PHQ-9 Score 5 26 23  - -  Difficult doing work/chores Somewhat difficult Somewhat difficult Extremely dIfficult - -  Some recent data might be hidden   Fall Risk  02/27/2018 10/04/2017 08/19/2017 07/18/2017 04/19/2017  Falls in the past year? 1 No No No Yes  Comment - - - - -  Number falls in past yr: 1 - - - 1  Comment - - - - -  Injury with Fall? 0 - - - No  Risk Factor Category  - - - - -  Risk for fall due to : - - - - -  Follow up - - - - -  Comment - - - - -    Relevant past medical, surgical, family and social history reviewed Past Medical History:  Diagnosis Date  . ADHD (attention deficit hyperactivity disorder)   . Apnea, sleep 06/30/2015  . Arthritis   . Asthma   . Asthma with acute exacerbation 06/30/2015  . Bipolar 1 disorder ([place])   . COPD (chronic obstructive pulmonary disease) ([place])   . COPD, moderate ([place]) 11/18/2013   Overview:  Last Assessment & Plan:  Refer to pulmonologist Overview:  Overview:  Last Assessment & Plan:  Pneumonia vaccine offered and given today; she did not tolerate LABA; will start her on Spiriva; stop combivent neb; use only SABA neb OR inhaler, not both; smoking cessation urged, and I am here to help if she wants assistance quitting; f/u in 4 weeks Last Assessment & Plan:  Pneumonia vaccin  . Depression   . GERD (gastroesophageal reflux disease) 08/30/2015  . History of acute myocardial infarction 06/30/2015    Overview:  [facility]   . Low HDL (under 40) 08/30/2015  . MI (myocardial infarction) ([place])   . Parkinson disease ([place])   . Pre-diabetes   . Prediabetes 05/20/2016  . PTSD (post-traumatic stress disorder)   . Rhabdomyolysis   . Sleep apnea 06/30/2015  . Stage 3 severe COPD by GOLD classification ([place]) 11/18/2013  . Stroke ([place])   . Uncomplicated asthma 05/27/9620  . Varicella 02/25/2017   Past Surgical History:  Procedure Laterality Date  . BACK SURGERY    . COLONOSCOPY WITH PROPOFOL N/A 08/07/2017   Procedure: COLONOSCOPY WITH PROPOFOL;  Surgeon: Virgel Manifold, MD;  Location: [facility] ENDOSCOPY;  Service: Endoscopy;  Laterality: N/A;  . JOINT REPLACEMENT    . REPLACEMENT TOTAL KNEE Left    Family History  Problem Relation Age of Onset  . Hernia Mother   . Heart disease Mother   . OCD Mother   . Diabetes Mother   . Parkinson's disease Father   . Bipolar disorder Sister   . Schizophrenia Sister   . ADD / ADHD Sister   . Alcohol abuse Brother   . Bipolar disorder Sister   . Paranoid behavior Sister   . ADD / ADHD Sister   . ADD / ADHD Son   . Dementia Maternal Grandmother   . Emphysema Maternal Grandfather   . ADD / ADHD Son   . ADD / ADHD Son   . Depression Son    Social History   Tobacco Use  . Smoking status: Current Every Day Smoker    Packs/day: 1.00    Years: 36.00    Pack years: 36.00    Types: Cigarettes    Start date: 10/11/1984  . Smokeless tobacco: Never Used  . Tobacco comment: Rx for Nioderm  Substance Use Topics  . Alcohol use: No    Alcohol/week: 0.0 standard drinks  . Drug use: No     Office Visit from 02/27/2018 in [facility]  AUDIT-C Score  0      Interim medical history since last visit reviewed. Allergies and medications reviewed  Review of Systems Per HPI unless specifically indicated above     Objective:    BP 138/74   Pulse 91   Temp 98.5 F (36.9 C) (Oral)   Ht 5\' 1"  (1.549 m)   Wt 220 lb 4.8 oz (99.9 kg)   LMP   (LMP Unknown)   SpO2 95%   BMI 41.63 kg/m  Wt Readings from Last 3 Encounters:  02/27/18 220 lb 4.8 oz (99.9 kg)  10/08/17 226 lb 9.6 oz (102.8 kg)  10/04/17 223 lb (101.2 kg)    Physical Exam Constitutional:      General: She is not in acute distress.    Appearance: She is well-developed. She is obese. She is not ill-appearing or diaphoretic.  HENT:     Head: Normocephalic and atraumatic.     Mouth/Throat:     Pharynx: Posterior oropharyngeal erythema (mild injection posteriorly) present. No pharyngeal swelling or oropharyngeal exudate.     Tonsils: No tonsillar exudate.  Eyes:     General: No scleral icterus. Neck:     Thyroid: No thyromegaly.  Cardiovascular:     Rate and Rhythm: Normal rate and regular rhythm.     Heart sounds: Normal heart sounds. No murmur.  Pulmonary:     Effort: Pulmonary effort is normal. No respiratory distress.     Breath sounds: Normal breath sounds. No wheezing.  Abdominal:     General: Bowel sounds are normal. There is no distension.     Palpations: Abdomen is soft.  Lymphadenopathy:     Cervical: No cervical adenopathy.  Skin:    General: Skin is warm and dry.     Coloration: Skin is not pale.  Neurological:     Mental Status: She is alert.  Psychiatric:        Behavior: Behavior normal.        Thought Content: Thought content normal.        Judgment: Judgment normal.     Results for orders placed or performed in visit on 02/27/18  POCT Influenza A/B  Result Value Ref Range   Influenza A, POC Negative Negative   Influenza B, POC Negative Negative      Assessment & Plan:   Problem List Items Addressed This Visit      Respiratory   Stage 3 severe COPD by GOLD classification ([place])    Doing well even with this illness; not seeing pulmonologist      Relevant Orders   COMPLETE METABOLIC PANEL WITH GFR     Other   Tobacco abuse    Encouraged smoking; transportation is an issue so she cannot attent classes at the hospital,  but can use QUIT[facility]NOW services, number given      Prediabetes    Check A1c      Relevant Orders   Hemoglobin A1c   Morbid obesity ([place])    Praise given for weight loss; offered nutritionist referral; nutritional counseling given today; limit the sweet tea; try to drink more water; limit portions      Relevant Orders   TSH   Low HDL (under 40)    Check lipids today; working on weight loss      Relevant Orders   Lipid panel    Other Visit Diagnoses    Viral infection    -  Primary   flu swab done; rest and hydration; contagious so take care; call or seek medical care if worse   Relevant Orders   CBC with Differential/Platelet   Assistance needed with transportation       Relevant Orders   Referral to Chronic Care Management Services   Housing or economic problem       Relevant Orders   Referral to Chronic Care Management Services   Cough       Relevant Orders   POCT Influenza A/B (Completed)   Chills  Relevant Orders   POCT Influenza A/B (Completed)       Follow up plan: Return in about 5 weeks (around 04/03/2018) for CPE with Dr. Sanda Klein (before or after is okay).  An after-visit summary was printed and given to the patient at [facility].  Please see the patient instructions which may contain other information and recommendations beyond what is mentioned above in the assessment and plan.  No orders of the defined types were placed in this encounter.   Orders Placed This Encounter  Procedures  . CBC with Differential/Platelet  . COMPLETE METABOLIC PANEL WITH GFR  . Lipid panel  . TSH  . Hemoglobin A1c  . Referral to Chronic Care Management Services  . POCT Influenza A/B

## 2018-02-27 NOTE — Patient Instructions (Addendum)
I do encourage you to quit smoking Call [phone] to sign up for smoking cessation classes You can call 1-800-QUIT-NOW to talk with a smoking cessation coach  Check out the information at familydoctor.org entitled "Nutrition for Weight Loss: What You Need to Know about Fad Diets" Try to lose between 1-2 pounds per week by taking in fewer calories and burning off more calories You can succeed by limiting portions, limiting foods dense in calories and fat, becoming more active, and drinking 8 glasses of water a day (64 ounces) Don't skip meals, especially breakfast, as skipping meals may alter your metabolism Do not use over-the-counter weight loss pills or gimmicks that claim rapid weight loss A healthy BMI (or body mass index) is between 18.5 and 24.9 You can calculate your ideal BMI at the [place] website ClubMonetize.fr  Obesity, Adult Obesity is the condition of having too much total body fat. Being overweight or obese means that your weight is greater than what is considered healthy for your body size. Obesity is determined by a measurement called BMI. BMI is an estimate of body fat and is calculated from height and weight. For adults, a BMI of 30 or higher is considered obese. Obesity can eventually lead to other health concerns and major illnesses, including:  Stroke.  Coronary artery disease (CAD).  Type 2 diabetes.  Some types of cancer, including cancers of the colon, breast, uterus, and gallbladder.  Osteoarthritis.  High blood pressure (hypertension).  High cholesterol.  Sleep apnea.  Gallbladder stones.  Infertility problems. What are the causes? The main cause of obesity is taking in (consuming) more calories than your body uses for energy. Other factors that contribute to this condition may include:  Being born with genes that make you more likely to become obese.  Having a medical condition that causes obesity.  These conditions include: ? Hypothyroidism. ? Polycystic ovarian syndrome (PCOS). ? Binge-eating disorder. ? Cushing syndrome.  Taking certain medicines, such as steroids, antidepressants, and seizure medicines.  Not being physically active (sedentary lifestyle).  Living where there are limited places to exercise safely or buy healthy foods.  Not getting enough sleep. What increases the risk? The following factors may increase your risk of this condition:  Having a family history of obesity.  Being a woman of African-American descent.  Being a man of Hispanic descent. What are the signs or symptoms? Having excessive body fat is the main symptom of this condition. How is this diagnosed? This condition may be diagnosed based on:  Your symptoms.  Your medical history.  A physical exam. Your health care provider may measure: ? Your BMI. If you are an adult with a BMI between 25 and less than 30, you are considered overweight. If you are an adult with a BMI of 30 or higher, you are considered obese. ? The distances around your hips and your waist (circumferences). These may be compared to each other to help diagnose your condition. ? Your skinfold thickness. Your health care provider may gently pinch a fold of your skin and measure it. How is this treated? Treatment for this condition often includes changing your lifestyle. Treatment may include some or all of the following:  Dietary changes. Work with your health care provider and a dietitian to set a weight-loss goal that is healthy and reasonable for you. Dietary changes may include eating: ? Smaller portions. A portion size is the amount of a particular food that is healthy for you to eat at one time. This  varies from person to person. ? Low-calorie or low-fat options. ? More whole grains, fruits, and vegetables.  Regular physical activity. This may include aerobic activity (cardio) and strength training.  Medicine to help  you lose weight. Your health care provider may prescribe medicine if you are unable to lose 1 pound a week after 6 weeks of eating more healthily and doing more physical activity.  Surgery. Surgical options may include gastric banding and gastric bypass. Surgery may be done if: ? Other treatments have not helped to improve your condition. ? You have a BMI of 40 or higher. ? You have life-threatening health problems related to obesity. Follow these instructions at home:  Eating and drinking   Follow recommendations from your health care provider about what you eat and drink. Your health care provider may advise you to: ? Limit fast foods, sweets, and processed snack foods. ? Choose low-fat options, such as low-fat milk instead of whole milk. ? Eat 5 or more servings of fruits or vegetables every day. ? Eat at home more often. This gives you more control over what you eat. ? Choose healthy foods when you eat out. ? Learn what a healthy portion size is. ? Keep low-fat snacks on hand. ? Avoid sugary drinks, such as soda, fruit juice, iced tea sweetened with sugar, and flavored milk. ? Eat a healthy breakfast.  Drink enough water to keep your urine clear or pale yellow.  Do not go without eating for long periods of time (do not fast) or follow a fad diet. Fasting and fad diets can be unhealthy and even dangerous. Physical Activity  Exercise regularly, as told by your health care provider. Ask your health care provider what types of exercise are safe for you and how often you should exercise.  Warm up and stretch before being active.  Cool down and stretch after being active.  Rest between periods of activity. Lifestyle  Limit the time that you spend in front of your TV, computer, or video game system.  Find ways to reward yourself that do not involve food.  Limit alcohol intake to no more than 1 drink a day for nonpregnant women and 2 drinks a day for men. One drink equals 12 oz of  beer, 5 oz of wine, or 1 oz of hard liquor. General instructions  Keep a weight loss journal to keep track of the food you eat and how much you exercise you get.  Take over-the-counter and prescription medicines only as told by your health care provider.  Take vitamins and supplements only as told by your health care provider.  Consider joining a support group. Your health care provider may be able to recommend a support group.  Keep all follow-up visits as told by your health care provider. This is important. Contact a health care provider if:  You are unable to meet your weight loss goal after 6 weeks of dietary and lifestyle changes. This information is not intended to replace advice given to you by your health care provider. Make sure you discuss any questions you have with your health care provider. Document Released: 03/22/2004 Document Revised: 07/18/2015 Document Reviewed: 12/01/2014 Elsevier Interactive Patient Education  2019 [employer]  Preventing Unhealthy [employer], Adult Staying at a healthy weight is important to your overall health. When fat builds up in your body, you may become overweight or obese. Being overweight or obese increases your risk of developing certain health problems, such as heart disease, diabetes, sleeping problems,  joint problems, and some types of cancer. Unhealthy weight gain is often the result of making unhealthy food choices or not getting enough exercise. You can make changes to your lifestyle to prevent obesity and stay as healthy as possible. What nutrition changes can be made?   Eat only as much as your body needs. To do this: ? Pay attention to signs that you are hungry or full. Stop eating as soon as you feel full. ? If you feel hungry, try drinking water first before eating. Drink enough water so your urine is clear or pale yellow. ? Eat smaller portions. Pay attention to portion sizes when eating out. ? Look at serving sizes on food  labels. Most foods contain more than one serving per container. ? Eat the recommended number of calories for your gender and activity level. For most active people, a daily total of 2,000 calories is appropriate. If you are trying to lose weight or are not very active, you may need to eat fewer calories. Talk with your health care provider or a diet and nutrition specialist (dietitian) about how many calories you need each day.  Choose healthy foods, such as: ? Fruits and vegetables. At each meal, try to fill at least half of your plate with fruits and vegetables. ? Whole grains, such as whole-wheat bread, brown rice, and quinoa. ? Lean meats, such as chicken or fish. ? Other healthy proteins, such as beans, eggs, or tofu. ? Healthy fats, such as nuts, seeds, fatty fish, and olive oil. ? Low-fat or fat-free dairy products.  Check food labels, and avoid food and drinks that: ? Are high in calories. ? Have added sugar. ? Are high in sodium. ? Have saturated fats or trans fats.  Cook foods in healthier ways, such as by baking, broiling, or grilling.  Make a meal plan for the week, and shop with a grocery list to help you stay on track with your purchases. Try to avoid going to the grocery store when you are hungry.  When grocery shopping, try to shop around the outside of the store first, where the fresh foods are. Doing this helps you to avoid prepackaged foods, which can be high in sugar, salt (sodium), and fat. What lifestyle changes can be made?   Exercise for 30 or more minutes on 5 or more days each week. Exercising may include brisk walking, yard work, biking, running, swimming, and team sports like basketball and soccer. Ask your health care provider which exercises are safe for you.  Do muscle-strengthening activities, such as lifting weights or using resistance bands, on 2 or more days a week.  Do not use any products that contain nicotine or tobacco, such as cigarettes and  e-cigarettes. If you need help quitting, ask your health care provider.  Limit alcohol intake to no more than 1 drink a day for nonpregnant women and 2 drinks a day for men. One drink equals 12 oz of beer, 5 oz of wine, or 1 oz of hard liquor.  Try to get 7-9 hours of sleep each night. What other changes can be made?  Keep a food and activity journal to keep track of: ? What you ate and how many calories you had. Remember to count the calories in sauces, dressings, and side dishes. ? Whether you were active, and what exercises you did. ? Your calorie, weight, and activity goals.  Check your weight regularly. Track any changes. If you notice you have gained weight, make changes  to your diet or activity routine.  Avoid taking weight-loss medicines or supplements. Talk to your health care provider before starting any new medicine or supplement.  Talk to your health care provider before trying any new diet or exercise plan. Why are these changes important? Eating healthy, staying active, and having healthy habits can help you to prevent obesity. Those changes also:  Help you manage stress and emotions.  Help you connect with friends and family.  Improve your self-esteem.  Improve your sleep.  Prevent long-term health problems. What can happen if changes are not made? Being obese or overweight can cause you to develop joint or bone problems, which can make it hard for you to stay active or do activities you enjoy. Being obese or overweight also puts stress on your heart and lungs and can lead to health problems like diabetes, heart disease, and some cancers. Where to find more information Talk with your health care provider or a dietitian about healthy eating and healthy lifestyle choices. You may also find information from:  U.[place]. Department of Agriculture, MyPlate: FormerBoss.no  American Heart Association: www.heart.org  Centers for Disease Control and Prevention:  http://www.wolf.info/ Summary  Staying at a healthy weight is important to your overall health. It helps you to prevent certain diseases and health problems, such as heart disease, diabetes, joint problems, sleep disorders, and some types of cancer.  Being obese or overweight can cause you to develop joint or bone problems, which can make it hard for you to stay active or do activities you enjoy.  You can prevent unhealthy weight gain by eating a healthy diet, exercising regularly, not smoking, limiting alcohol, and getting enough sleep.  Talk with your health care provider or a dietitian for guidance about healthy eating and healthy lifestyle choices. This information is not intended to replace advice given to you by your health care provider. Make sure you discuss any questions you have with your health care provider. Document Released: 02/14/2016 Document Revised: 11/23/2016 Document Reviewed: 03/21/2016 Elsevier Interactive Patient Education  2019 [place] with Quitting Smoking  Quitting smoking is a physical and mental challenge. You will face cravings, withdrawal symptoms, and temptation. Before quitting, work with your health care provider to make a plan that can help you cope. Preparation can help you quit and keep you from giving in. How can I cope with cravings? Cravings usually last for 5-10 minutes. If you get through it, the craving will pass. Consider taking the following actions to help you cope with cravings:  Keep your mouth busy: ? Chew sugar-free gum. ? Suck on hard candies or a straw. ? Brush your teeth.  Keep your hands and body busy: ? Immediately change to a different activity when you feel a craving. ? Squeeze or play with a ball. ? Do an activity or a hobby, like making bead jewelry, practicing needlepoint, or working with wood. ? Mix up your normal routine. ? Take a short exercise break. Go for a quick walk or run up and down stairs. ? Spend time in public  places where smoking is not allowed.  Focus on doing something kind or helpful for someone else.  Call a friend or family member to talk during a craving.  Join a support group.  Call a quit line, such as 1-800-QUIT-NOW.  Talk with your health care provider about medicines that might help you cope with cravings and make quitting easier for you. How can I deal with withdrawal symptoms? Your  body may experience negative effects as it tries to get used to not having nicotine in the system. These effects are called withdrawal symptoms. They may include:  Feeling hungrier than normal.  Trouble concentrating.  Irritability.  Trouble sleeping.  Feeling depressed.  Restlessness and agitation.  Craving a cigarette. To manage withdrawal symptoms:  Avoid places, people, and activities that trigger your cravings.  Remember why you want to quit.  Get plenty of sleep.  Avoid coffee and other caffeinated drinks. These may worsen some of your symptoms. How can I handle social situations? Social situations can be difficult when you are quitting smoking, especially in the first few weeks. To manage this, you can:  Avoid parties, bars, and other social situations where people might be smoking.  Avoid alcohol.  Leave right away if you have the urge to smoke.  Explain to your family and friends that you are quitting smoking. Ask for understanding and support.  Plan activities with friends or family where smoking is not an option. What are some ways I can cope with stress? Wanting to smoke may cause stress, and stress can make you want to smoke. Find ways to manage your stress. Relaxation techniques can help. For example:  Breathe slowly and deeply, in through your nose and out through your mouth.  Listen to soothing, relaxing music.  Talk with a family member or friend about your stress.  Light a candle.  Soak in a bath or take a shower.  Think about a peaceful place. What  are some ways I can prevent weight gain? Be aware that many people gain weight after they quit smoking. However, not everyone does. To keep from gaining weight, have a plan in place before you quit and stick to the plan after you quit. Your plan should include:  Having healthy snacks. When you have a craving, it may help to: ? Eat plain popcorn, crunchy carrots, celery, or other cut vegetables. ? Chew sugar-free gum.  Changing how you eat: ? Eat small portion sizes at meals. ? Eat 4-6 small meals throughout the day instead of 1-2 large meals a day. ? Be mindful when you eat. Do not watch television or do other things that might distract you as you eat.  Exercising regularly: ? Make time to exercise each day. If you do not have time for a long workout, do short bouts of exercise for 5-10 minutes several times a day. ? Do some form of strengthening exercise, like weight lifting, and some form of aerobic exercise, like running or swimming.  Drinking plenty of water or other low-calorie or no-calorie drinks. Drink 6-8 glasses of water daily, or as much as instructed by your health care provider. Summary  Quitting smoking is a physical and mental challenge. You will face cravings, withdrawal symptoms, and temptation to smoke again. Preparation can help you as you go through these challenges.  You can cope with cravings by keeping your mouth busy (such as by chewing gum), keeping your body and hands busy, and making calls to family, friends, or a helpline for people who want to quit smoking.  You can cope with withdrawal symptoms by avoiding places where people smoke, avoiding drinks with caffeine, and getting plenty of rest.  Ask your health care provider about the different ways to prevent weight gain, avoid stress, and handle social situations. This information is not intended to replace advice given to you by your health care provider. Make sure you discuss any questions you have  with your  health care provider. Document Released: 02/10/2016 Document Revised: 02/10/2016 Document Reviewed: 02/10/2016 Elsevier Interactive Patient Education  2019 [employer].

## 2018-02-27 NOTE — Patient Instructions (Signed)
Nicole Austin , Thank you for taking time to come for your Medicare Wellness Visit. I appreciate your ongoing commitment to your health goals. Please review the following plan we discussed and let me know if I can assist you in the future.   Screening recommendations/referrals: Colonoscopy: done 08/07/17 Please contact gastroenterology to set up repeat screening.  Mammogram: Please call [phone] to schedule your mammogram.  Bone Density: due at age 58 Recommended yearly ophthalmology/optometry visit for glaucoma screening and checkup Recommended yearly dental visit for hygiene and checkup  Vaccinations: Influenza vaccine: postpone for today due to sickness Pneumococcal vaccine: done 06/21/15 second dose due at age 25 Tdap vaccine: done 12/13/15 Shingles vaccine: Shingrix discussed. Please contact your pharmacy for coverage information.     Advanced directives: Advance directive discussed with you today. I have provided a copy for you to complete at home and have notarized. Once this is complete please bring a copy in to our office so we can scan it into your chart.  Conditions/risks identified: Recommend increasing water intake to 6-8 glasses per day.   Next appointment: Please follow up in one year for your Medicare Annual Wellness visit.    Preventive Care 40-64 Years, Female Preventive care refers to lifestyle choices and visits with your health care provider that can promote health and wellness. What does preventive care include?  A yearly physical exam. This is also called an annual well check.  Dental exams once or twice a year.  Routine eye exams. Ask your health care provider how often you should have your eyes checked.  Personal lifestyle choices, including:  Daily care of your teeth and gums.  Regular physical activity.  Eating a healthy diet.  Avoiding tobacco and drug use.  Limiting alcohol use.  Practicing safe sex.  Taking low-dose aspirin daily starting at  age 11.  Taking vitamin and mineral supplements as recommended by your health care provider. What happens during an annual well check? The services and screenings done by your health care provider during your annual well check will depend on your age, overall health, lifestyle risk factors, and family history of disease. Counseling  Your health care provider may ask you questions about your:  Alcohol use.  Tobacco use.  Drug use.  Emotional well-being.  Home and relationship well-being.  Sexual activity.  Eating habits.  Work and work Statistician.  Method of birth control.  Menstrual cycle.  Pregnancy history. Screening  You may have the following tests or measurements:  Height, weight, and BMI.  Blood pressure.  Lipid and cholesterol levels. These may be checked every 5 years, or more frequently if you are over 61 years old.  Skin check.  Lung cancer screening. You may have this screening every year starting at age 58 if you have a 30-pack-year history of smoking and currently smoke or have quit within the past 15 years.  Fecal occult blood test (FOBT) of the stool. You may have this test every year starting at age 65.  Flexible sigmoidoscopy or colonoscopy. You may have a sigmoidoscopy every 5 years or a colonoscopy every 10 years starting at age 18.  Hepatitis C blood test.  Hepatitis B blood test.  Sexually transmitted disease (STD) testing.  Diabetes screening. This is done by checking your blood sugar (glucose) after you have not eaten for a while (fasting). You may have this done every 1-3 years.  Mammogram. This may be done every 1-2 years. Talk to your health care provider about when you  should start having regular mammograms. This may depend on whether you have a family history of breast cancer.  BRCA-related cancer screening. This may be done if you have a family history of breast, ovarian, tubal, or peritoneal cancers.  Pelvic exam and Pap test.  This may be done every 3 years starting at age 20. Starting at age 45, this may be done every 5 years if you have a Pap test in combination with an HPV test.  Bone density scan. This is done to screen for osteoporosis. You may have this scan if you are at high risk for osteoporosis. Discuss your test results, treatment options, and if necessary, the need for more tests with your health care provider. Vaccines  Your health care provider may recommend certain vaccines, such as:  Influenza vaccine. This is recommended every year.  Tetanus, diphtheria, and acellular pertussis (Tdap, Td) vaccine. You may need a Td booster every 10 years.  Zoster vaccine. You may need this after age 36.  Pneumococcal 13-valent conjugate (PCV13) vaccine. You may need this if you have certain conditions and were not previously vaccinated.  Pneumococcal polysaccharide (PPSV23) vaccine. You may need one or two doses if you smoke cigarettes or if you have certain conditions. Talk to your health care provider about which screenings and vaccines you need and how often you need them. This information is not intended to replace advice given to you by your health care provider. Make sure you discuss any questions you have with your health care provider. Document Released: 03/11/2015 Document Revised: 11/02/2015 Document Reviewed: 12/14/2014 Elsevier Interactive Patient Education  2017 [employer] Prevention in the Home Falls can cause injuries. They can happen to people of all ages. There are many things you can do to make your home safe and to help prevent falls. What can I do on the outside of my home?  Regularly fix the edges of walkways and driveways and fix any cracks.  Remove anything that might make you trip as you walk through a door, such as a raised step or threshold.  Trim any bushes or trees on the path to your home.  Use bright outdoor lighting.  Clear any walking paths of anything that  might make someone trip, such as rocks or tools.  Regularly check to see if handrails are loose or broken. Make sure that both sides of any steps have handrails.  Any raised decks and porches should have guardrails on the edges.  Have any leaves, snow, or ice cleared regularly.  Use sand or salt on walking paths during winter.  Clean up any spills in your garage right away. This includes oil or grease spills. What can I do in the bathroom?  Use night lights.  Install grab bars by the toilet and in the tub and shower. Do not use towel bars as grab bars.  Use non-skid mats or decals in the tub or shower.  If you need to sit down in the shower, use a plastic, non-slip stool.  Keep the floor dry. Clean up any water that spills on the floor as soon as it happens.  Remove soap buildup in the tub or shower regularly.  Attach bath mats securely with double-sided non-slip rug tape.  Do not have throw rugs and other things on the floor that can make you trip. What can I do in the bedroom?  Use night lights.  Make sure that you have a light by your bed that is  easy to reach.  Do not use any sheets or blankets that are too big for your bed. They should not hang down onto the floor.  Have a firm chair that has side arms. You can use this for support while you get dressed.  Do not have throw rugs and other things on the floor that can make you trip. What can I do in the kitchen?  Clean up any spills right away.  Avoid walking on wet floors.  Keep items that you use a lot in easy-to-reach places.  If you need to reach something above you, use a strong step stool that has a grab bar.  Keep electrical cords out of the way.  Do not use floor polish or wax that makes floors slippery. If you must use wax, use non-skid floor wax.  Do not have throw rugs and other things on the floor that can make you trip. What can I do with my stairs?  Do not leave any items on the stairs.  Make  sure that there are handrails on both sides of the stairs and use them. Fix handrails that are broken or loose. Make sure that handrails are as long as the stairways.  Check any carpeting to make sure that it is firmly attached to the stairs. Fix any carpet that is loose or worn.  Avoid having throw rugs at the top or bottom of the stairs. If you do have throw rugs, attach them to the floor with carpet tape.  Make sure that you have a light switch at the top of the stairs and the bottom of the stairs. If you do not have them, ask someone to add them for you. What else can I do to help prevent falls?  Wear shoes that:  Do not have high heels.  Have rubber bottoms.  Are comfortable and fit you well.  Are closed at the toe. Do not wear sandals.  If you use a stepladder:  Make sure that it is fully opened. Do not climb a closed stepladder.  Make sure that both sides of the stepladder are locked into place.  Ask someone to hold it for you, if possible.  Clearly mark and make sure that you can see:  Any grab bars or handrails.  First and last steps.  Where the edge of each step is.  Use tools that help you move around (mobility aids) if they are needed. These include:  Canes.  Walkers.  Scooters.  Crutches.  Turn on the lights when you go into a dark area. Replace any light bulbs as soon as they burn out.  Set up your furniture so you have a clear path. Avoid moving your furniture around.  If any of your floors are uneven, fix them.  If there are any pets around you, be aware of where they are.  Review your medicines with your doctor. Some medicines can make you feel dizzy. This can increase your chance of falling. Ask your doctor what other things that you can do to help prevent falls. This information is not intended to replace advice given to you by your health care provider. Make sure you discuss any questions you have with your health care provider. Document  Released: 12/09/2008 Document Revised: 07/21/2015 Document Reviewed: 03/19/2014 Elsevier Interactive Patient Education  2017 [employer].

## 2018-02-27 NOTE — Assessment & Plan Note (Signed)
Check A1c. 

## 2018-02-27 NOTE — Progress Notes (Signed)
Subjective:   Nicole Austin is a 58 y.o. female who presents for an Initial Medicare Annual Wellness Visit.  Review of Systems      Cardiac Risk Factors include: dyslipidemia;obesity (BMI >30kg/m2);smoking/ tobacco exposure     Objective:    Today's Vitals   02/27/18 1517  BP: 138/74  Pulse: 91  Resp: 18  Temp: 98.5 F (36.9 C)  TempSrc: Oral  SpO2: 95%  Weight: 220 lb 4.8 oz (99.9 kg)  Height: 5' 1"  (1.549 m)   Body mass index is 41.63 kg/m.  Advanced Directives 02/27/2018 08/07/2017 01/10/2017 01/03/2017 01/02/2017 11/06/2016 10/16/2016  Does Patient Have a Medical Advance Directive? No No No No No No No  Type of Advance Directive - - - - - - -  Does patient want to make changes to medical advance directive? - - - - - - -  Copy of [place] in Chart? - - - - - - -  Would patient like information on creating a medical advance directive? Yes (MAU/Ambulatory/Procedural Areas - Information given) - No - Patient declined No - Patient declined - - -  Some encounter information is confidential and restricted. Go to Review Flowsheets activity to see all data.    Current Medications (verified) Outpatient Encounter Medications as of 02/27/2018  Medication Sig  . acetaminophen (TYLENOL) 500 MG tablet Take 500 mg by mouth every 6 (six) hours as needed.  Marland Kitchen albuterol (PROVENTIL HFA;VENTOLIN HFA) 108 (90 Base) MCG/ACT inhaler Inhale 2 puffs into the lungs every 6 (six) hours as needed for wheezing or shortness of breath.  Marland Kitchen albuterol (PROVENTIL) (2.5 MG/3ML) 0.083% nebulizer solution Take 3 mLs (2.5 mg total) every 4 (four) hours as needed by nebulization for wheezing or shortness of breath.  . Alpha-D-Galactosidase (BEANO PO) Take 1 tablet by mouth as needed.  . ALPRAZolam (XANAX) 0.5 MG tablet Take 0.5 mg by mouth 3 (three) times daily as needed for anxiety or sleep. And bedtime if needed  . aspirin-acetaminophen-caffeine (EXCEDRIN MIGRAINE) 250-250-65 MG tablet  Take by mouth every 6 (six) hours as needed for headache.  Marland Kitchen atorvastatin (LIPITOR) 40 MG tablet TAKE 1 TABLET BY MOUTH AT BEDTIME.  . benztropine (COGENTIN) 1 MG tablet Take 1 mg by mouth at bedtime.  . divalproex (DEPAKOTE) 250 MG DR tablet Take 1 tablet (250 mg total) by mouth 2 (two) times daily.  Marland Kitchen escitalopram (LEXAPRO) 20 MG tablet Take 1 tablet (20 mg total) by mouth daily.  . furosemide (LASIX) 20 MG tablet TAKE 1 TABLET BY MOUTH EVERY 3 DAYS FOR SWELLING.  Marland Kitchen ipratropium-albuterol (DUONEB) 0.5-2.5 (3) MG/3ML SOLN Take 3 mLs every 6 (six) hours by nebulization.  Marland Kitchen lurasidone (LATUDA) 40 MG TABS tablet Take 60 mg by mouth every evening.   . Misc. Devices (BARIATRIC ROLLATOR) MISC For use when walking; dx: degenerative changes of spine, OA of knee; morbid obesity, COPD  . Multiple Vitamin (MULTIVITAMIN WITH MINERALS) TABS tablet Take 1 tablet by mouth daily.  . potassium chloride (K-DUR) 10 MEQ tablet TAKE 1 TABLET BY MOUTH EVERY THREE DAYS  . promethazine (PHENERGAN) 25 MG tablet Take 1 tablet (25 mg total) by mouth every 8 (eight) hours as needed for nausea or vomiting.  . umeclidinium-vilanterol (ANORO ELLIPTA) 62.5-25 MCG/INH AEPB Inhale 1 puff into the lungs daily. (this replaces the spiriva)  . [DISCONTINUED] Respiratory Therapy Supplies (NEBULIZER COMPRESSOR) KIT One nebulizer kit/machine with tubing; for use with nebulized albuterol   No facility-administered encounter medications on file  as of 02/27/2018.     Allergies (verified) Chantix [varenicline] and Codeine   History: Past Medical History:  Diagnosis Date  . ADHD (attention deficit hyperactivity disorder)   . Apnea, sleep 06/30/2015  . Arthritis   . Asthma   . Asthma with acute exacerbation 06/30/2015  . Bipolar 1 disorder ([place])   . COPD (chronic obstructive pulmonary disease) ([place])   . COPD, moderate ([place]) 11/18/2013   Overview:  Last Assessment & Plan:  Refer to pulmonologist Overview:  Overview:  Last Assessment &  Plan:  Pneumonia vaccine offered and given today; she did not tolerate LABA; will start her on Spiriva; stop combivent neb; use only SABA neb OR inhaler, not both; smoking cessation urged, and I am here to help if she wants assistance quitting; f/u in 4 weeks Last Assessment & Plan:  Pneumonia vaccin  . Depression   . GERD (gastroesophageal reflux disease) 08/30/2015  . History of acute myocardial infarction 06/30/2015   Overview:  [facility]   . Hyperlipidemia   . Low HDL (under 40) 08/30/2015  . MI (myocardial infarction) ([place])   . Parkinson disease ([place])   . Pre-diabetes   . Prediabetes 05/20/2016  . PTSD (post-traumatic stress disorder)   . Rhabdomyolysis   . Sleep apnea 06/30/2015  . Stage 3 severe COPD by GOLD classification ([place]) 11/18/2013  . Stroke ([place])   . Uncomplicated asthma 7/0/9643  . Varicella 02/25/2017   Past Surgical History:  Procedure Laterality Date  . BACK SURGERY    . COLONOSCOPY WITH PROPOFOL N/A 08/07/2017   Procedure: COLONOSCOPY WITH PROPOFOL;  Surgeon: Virgel Manifold, MD;  Location: [facility] ENDOSCOPY;  Service: Endoscopy;  Laterality: N/A;  . JOINT REPLACEMENT    . REPLACEMENT TOTAL KNEE Left    Family History  Problem Relation Age of Onset  . Hernia Mother   . Heart disease Mother   . OCD Mother   . Diabetes Mother   . Parkinson's disease Father   . Bipolar disorder Sister   . Schizophrenia Sister   . ADD / ADHD Sister   . Alcohol abuse Brother   . Bipolar disorder Sister   . Paranoid behavior Sister   . ADD / ADHD Sister   . ADD / ADHD Son   . Dementia Maternal Grandmother   . Emphysema Maternal Grandfather   . ADD / ADHD Son   . ADD / ADHD Son   . Depression Son    Social History   Socioeconomic History  . Marital status: Single    Spouse name: Not on file  . Number of children: Not on file  . Years of education: Not on file  . Highest education level: Not on file  Occupational History  . Occupation: Disability  Social Needs  . Financial  resource strain: Somewhat hard  . Food insecurity:    Worry: Sometimes true    Inability: Sometimes true  . Transportation needs:    Medical: No    Non-medical: No  Tobacco Use  . Smoking status: Current Every Day Smoker    Packs/day: 1.00    Years: 36.00    Pack years: 36.00    Types: Cigarettes    Start date: 10/11/1984  . Smokeless tobacco: Never Used  Substance and Sexual Activity  . Alcohol use: No    Alcohol/week: 0.0 standard drinks  . Drug use: No  . Sexual activity: Not Currently  Lifestyle  . Physical activity:    Days per week: 0 days  Minutes per session: 0 min  . Stress: Rather much  Relationships  . Social connections:    Talks on phone: Once a week    Gets together: Once a week    Attends religious service: Never    Active member of club or organization: No    Attends meetings of clubs or organizations: Never    Relationship status: Patient refused  Other Topics Concern  . Not on file  Social History Narrative   Pt is currently homeless but living with her sister temporarily. Has reached out to [employer] [facility] [employer] with housing and food. Sister has agreed to assist with Transportation to all appts and to [employer]. Pt has also made arrangements with [facility] for assistance with affording Nicoderm and Mucinex.    Tobacco Counseling Ready to quit: Yes Counseling given: Yes   Clinical Intake:  Pre-visit preparation completed: Yes  Pain : No/denies pain     BMI - recorded: 41.63 Nutritional Status: BMI > 30  Obese Nutritional Risks: Nausea/ vomitting/ diarrhea(mild diarrhea, pt has head cold) Diabetes: No  How often do you need to have someone help you when you read instructions, pamphlets, or other written materials from your doctor or pharmacy?: 1 - Never What is the last grade level you completed in school?: 10th grade  Interpreter Needed?: No  Information entered by :: [name] LPN   Activities of Daily Living In  your present state of health, do you have any difficulty performing the following activities: 02/27/2018 02/27/2018  Hearing? N N  Comment declines hearing aids -  Vision? N N  Comment wears glasses, due for eye exam -  Difficulty concentrating or making decisions? Y N  Walking or climbing stairs? Y Y  Dressing or bathing? N N  Doing errands, shopping? [name]  Preparing Food and eating ? N -  Using the Toilet? N -  In the past six months, have you accidently leaked urine? Y -  Comment wears pads for protection -  Do you have problems with loss of bowel control? N -  Managing your Medications? N -  Managing your Finances? N -  Housekeeping or managing your Housekeeping? N -  Some recent data might be hidden     Immunizations and Health Maintenance Immunization History  Administered Date(s) Administered  . Influenza Split 12/15/2012  . Influenza,inj,Quad PF,6+ Mos 10/27/2013, 12/22/2014, 12/13/2015, 10/16/2016  . Influenza-Unspecified 11/07/2011, 12/15/2012  . Pneumococcal Polysaccharide-23 06/21/2015  . Tdap 12/13/2015   Health Maintenance Due  Topic Date Due  . MAMMOGRAM  07/01/1977  . PAP SMEAR-Modifier  12/22/2017    Patient Care Team: Lada, Satira Anis, MD as PCP - General (Family Medicine) Rainey Pines, MD as Referring Physician (Psychiatry) Virgel Manifold, MD as Consulting Physician (Gastroenterology)  Indicate any recent Medical Services you may have received from other than [facility] providers in the past year (date may be approximate).     Assessment:   This is a routine wellness examination for Shantina.  Hearing/Vision screen Hearing Screening Comments: Pt has no difficulty hearing  Vision Screening Comments: Past due for vision screening, Dr. Ellin Mayhew  Dietary issues and exercise activities discussed: Current Exercise Habits: The patient does not participate in regular exercise at present, Exercise limited by: orthopedic condition(s);neurologic  condition(s)  Goals    . DIET - INCREASE WATER INTAKE     Recommend increasing water to 6-8 glasses per day    . Eat more fruits and vegetables    .  Increase physical activity    . Increase physical activity      Depression Screen PHQ 2/9 Scores 02/27/2018 10/04/2017 08/19/2017 07/18/2017 04/19/2017 02/13/2017 01/10/2017  PHQ - 2 Score 1 6 5  0 1 6 1   PHQ- 9 Score 5 26 23  - - 22 -  Exception Documentation - - - - - - -    Fall Risk Fall Risk  02/27/2018 02/27/2018 10/04/2017 08/19/2017 07/18/2017  Falls in the past year? 1 1 No No No  Comment - - - - -  Number falls in past yr: 1 1 - - -  Comment - - - - -  Injury with Fall? 0 0 - - -  Comment tripped in house, no major injury - - - -  Risk Factor Category  - - - - -  Risk for fall due to : History of fall(s) - - - -  Follow up Falls evaluation completed;Falls prevention discussed - - - -  Comment - - - - -   FALL RISK PREVENTION PERTAINING TO THE HOME:  Any stairs in or around the home WITH handrails? No  Home free of loose throw rugs in walkways, pet beds, electrical cords, etc? No  currently lives with sister Adequate lighting in your home to reduce risk of falls? Yes   ASSISTIVE DEVICES UTILIZED TO PREVENT FALLS:  Life alert? No  Use of a cane, walker or w/c? Yes  Grab bars in the bathroom? No  Shower chair or bench in shower? Yes  Elevated toilet seat or a handicapped toilet? No   DME ORDERS:  DME order needed?  No   TIMED UP AND GO:  Was the test performed? Yes .  Length of time to ambulate 10 feet: 6 sec.   GAIT:  Appearance of gait: Gait stead-fast and without the use of an assistive device.  Education: Fall risk prevention has been discussed.  Intervention(s) required? No    Cognitive Function:     6CIT Screen 02/27/2018  What Year? 0 points  What month? 0 points  What time? 0 points  Count back from 20 0 points  Months in reverse 0 points  Repeat phrase 4 points  Total Score 4    Screening  Tests Health Maintenance  Topic Date Due  . MAMMOGRAM  07/01/1977  . PAP SMEAR-Modifier  12/22/2017  . INFLUENZA VACCINE  03/29/2018 (Originally 09/26/2017)  . TETANUS/TDAP  12/12/2025  . COLONOSCOPY  08/08/2027  . Hepatitis C Screening  Completed  . HIV Screening  Completed    Qualifies for Shingles Vaccine? Yes . Due for Shingrix. Education has been provided regarding the importance of this vaccine. Pt has been advised to call insurance company to determine out of pocket expense. Advised may also receive vaccine at local pharmacy or Health Dept. Verbalized acceptance and understanding.  Tdap: Up to date  Flu Vaccine: Due for Flu vaccine. Does the patient want to receive this vaccine today?  No . Education has been provided regarding the importance of this vaccine but still declined. Advised may receive this vaccine at local pharmacy or Health Dept. Aware to provide a copy of the vaccination record if obtained from local pharmacy or Health Dept. Verbalized acceptance and understanding. Pt seen in office today for sick visit for viral URI.  Pneumococcal Vaccine: PCV13 due age 41  Cancer Screenings:  Colorectal Screening: Completed 08/07/17. Pt received letter to repeat colonoscopy due to eating something prior to procedure and not completing prep. Pt to contact  GI to reschedule.   Mammogram: Ordered 07/18/17. Pt provided with contact information and advised to call to schedule appt.   Bone Density: due age 38   Lung Cancer Screening: (Low Dose CT Chest recommended if Age 51-80 years, 30 pack-year currently smoking OR have quit w/in 15years.) does qualify.   Lung Cancer Screening Referral: An Epic message has been sent to Burgess Estelle, RN (Oncology Nurse Navigator) regarding the possible need for this exam. Raquel Sarna will review the patient's chart to determine if the patient truly qualifies for the exam. If the patient qualifies, Raquel Sarna will order the Low Dose CT of the chest to facilitate the  scheduling of this exam.  Additional Screening:  Hepatitis C Screening: does qualify; Completed 10/16/16  Vision Screening: Recommended annual ophthalmology exams for early detection of glaucoma and other disorders of the eye. Is the patient up to date with their annual eye exam?  No  Who is the provider or what is the name of the office in which the pt attends annual eye exams? [place] Screening: Recommended annual dental exams for proper oral hygiene  Community Resource Referral:  CRR required this visit?  No     Plan:    I have personally reviewed and addressed the Medicare Annual Wellness questionnaire and have noted the following in the patient's chart:  A. Medical and social history B. Use of alcohol, tobacco or illicit drugs  C. Current medications and supplements D. Functional ability and status E.  Nutritional status F.  Physical activity G. Advance directives H. List of other physicians I.  Hospitalizations, surgeries, and ER visits in previous 12 months J.  [place] such as hearing and vision if needed, cognitive and depression L. Referrals and appointments   In addition, I have reviewed and discussed with patient certain preventive protocols, quality metrics, and best practice recommendations. A written personalized care plan for preventive services as well as general preventive health recommendations were provided to patient.   Signed,  [name], LPN Nurse Health Advisor   Nurse Notes: pt seen in office for sick visit prior to appt with me for viral URI. Pt is currently living with her sister and has an interview for her own apartment on Monday so that she can become more independent and apply for food stamps and assistance. Pt aware of  community resources options and will contact us for additional information if needed.

## 2018-02-27 NOTE — Assessment & Plan Note (Signed)
Check lipids today; working on weight loss

## 2018-02-28 ENCOUNTER — Telehealth: Payer: Self-pay | Admitting: *Deleted

## 2018-02-28 DIAGNOSIS — Z122 Encounter for screening for malignant neoplasm of respiratory organs: Secondary | ICD-10-CM

## 2018-02-28 DIAGNOSIS — Z87891 Personal history of nicotine dependence: Secondary | ICD-10-CM

## 2018-02-28 LAB — CBC WITH DIFFERENTIAL/PLATELET
Absolute Monocytes: 670 cells/uL (ref 200–950)
BASOS ABS: 74 {cells}/uL (ref 0–200)
Basophils Relative: 0.8 %
EOS ABS: 651 {cells}/uL — AB (ref 15–500)
Eosinophils Relative: 7 %
HCT: 41.8 % (ref 35.0–45.0)
HEMOGLOBIN: 13.9 g/dL (ref 11.7–15.5)
Lymphs Abs: 2911 cells/uL (ref 850–3900)
MCH: 30.4 pg (ref 27.0–33.0)
MCHC: 33.3 g/dL (ref 32.0–36.0)
MCV: 91.5 fL (ref 80.0–100.0)
MONOS PCT: 7.2 %
MPV: 11.9 fL (ref 7.5–12.5)
Neutro Abs: 4994 cells/uL (ref 1500–7800)
Neutrophils Relative %: 53.7 %
PLATELETS: 227 10*3/uL (ref 140–400)
RBC: 4.57 10*6/uL (ref 3.80–5.10)
RDW: 12.2 % (ref 11.0–15.0)
TOTAL LYMPHOCYTE: 31.3 %
WBC: 9.3 10*3/uL (ref 3.8–10.8)

## 2018-02-28 LAB — LIPID PANEL
Cholesterol: 112 mg/dL (ref <200)
HDL: 34 mg/dL — ABNORMAL LOW (ref >50)
LDL Cholesterol (Calc): 54 mg/dL (calc)
Non-HDL Cholesterol (Calc): 78 mg/dL (calc) (ref <130)
Total CHOL/HDL Ratio: 3.3 (calc) (ref <5.0)
Triglycerides: 159 mg/dL — ABNORMAL HIGH (ref <150)

## 2018-02-28 LAB — COMPLETE METABOLIC PANEL WITH GFR
AG RATIO: 1.5 (calc) (ref 1.0–2.5)
ALT: 7 U/L (ref 6–29)
AST: 11 U/L (ref 10–35)
Albumin: 3.8 g/dL (ref 3.6–5.1)
Alkaline phosphatase (APISO): 72 U/L (ref 33–130)
BUN: 11 mg/dL (ref 7–25)
CO2: 30 mmol/L (ref 20–32)
Calcium: 9.2 mg/dL (ref 8.6–10.4)
Chloride: 102 mmol/L (ref 98–110)
Creat: 1.03 mg/dL (ref 0.50–1.05)
GFR, EST AFRICAN AMERICAN: 69 mL/min/{1.73_m2} (ref >=60)
GFR, EST NON AFRICAN AMERICAN: 60 mL/min/{1.73_m2} (ref >=60)
GLOBULIN: 2.5 g/dL (ref 1.9–3.7)
Glucose, Bld: 85 mg/dL (ref 65–99)
POTASSIUM: 4.3 mmol/L (ref 3.5–5.3)
SODIUM: 138 mmol/L (ref 135–146)
TOTAL PROTEIN: 6.3 g/dL (ref 6.1–8.1)
Total Bilirubin: 0.4 mg/dL (ref 0.2–1.2)

## 2018-02-28 LAB — HEMOGLOBIN A1C
Hgb A1c MFr Bld: 5.5 % of total Hgb (ref <5.7)
Mean Plasma Glucose: 111 (calc)
eAG (mmol/L): 6.2 (calc)

## 2018-02-28 LAB — TSH: TSH: 1.29 mIU/L (ref 0.40–4.50)

## 2018-02-28 NOTE — Progress Notes (Signed)
Nicole Austin, please let the patient know that her labs overall look stable; her cholesterol is better; her kidney function has improved; A1c is back to normal; thank you

## 2018-02-28 NOTE — Telephone Encounter (Signed)
Received referral for initial lung cancer screening scan. Contacted patient and obtained smoking history,(current, 36 pack year) as well as answering questions related to screening process. Patient denies signs of lung cancer such as weight loss or hemoptysis. Patient denies comorbidity that would prevent curative treatment if lung cancer were found. Patient is scheduled for shared decision making visit and CT scan on 03/25/18 at 145. Delay is per patient request.

## 2018-03-07 ENCOUNTER — Ambulatory Visit: Payer: Self-pay

## 2018-03-07 DIAGNOSIS — F1910 Other psychoactive substance abuse, uncomplicated: Secondary | ICD-10-CM

## 2018-03-07 DIAGNOSIS — Z748 Other problems related to care provider dependency: Secondary | ICD-10-CM

## 2018-03-07 DIAGNOSIS — J449 Chronic obstructive pulmonary disease, unspecified: Secondary | ICD-10-CM

## 2018-03-07 DIAGNOSIS — Z599 Problem related to housing and economic circumstances, unspecified: Secondary | ICD-10-CM

## 2018-03-07 DIAGNOSIS — Z720 Tobacco use: Secondary | ICD-10-CM

## 2018-03-07 NOTE — Chronic Care Management (AMB) (Signed)
Chronic Care Management   Note  03/07/2018 Name: [name] MRN: [id number] DOB: [birth year]   Nicole Austin is a 58 year old female who sees Dr. Baruch Gouty for primary care. Dr. Sherie Don asked the CCM team to consult the patient for assistance with chronic disease management related to financial insecurities, transportation, and  housing. Referral was placed 02/27/2018. Telephone outreach to patient today to introduce CCM services.   [name] agreed that CCM services would be helpful to them.   She does not have time to discuss chronic disease management at this time as she is at the hospital with her sister. She also consents to a referral to C3 for her financial insecurities and transportation barrier. She states her housing has been resolved as she will move into an apartment in [place] 03/13/2018.   Patient agreed to services and verbal consent obtained  [name] was given information about Chronic Care Management services today including:  1. CCM service includes personalized support from designated clinical staff supervised by her physician, including individualized plan of care and coordination with other care providers 2. 24/7 contact phone numbers for assistance for urgent and routine care needs. 3. Service will only be billed when office clinical staff spend 20 minutes or more in a month to coordinate care. 4. Only one practitioner may furnish and bill the service in a calendar month. 5. The patient may stop CCM services at any time (effective at the end of the month) by phone call to the office staff. 6. The patient will be responsible for cost sharing (co-pay) of up to 20% of the service fee (after annual deductible is met). .  Goals Addressed    . "I need help with transportation" (pt-stated)       Current Barriers: knowledge deficits related to available resources for transportation  Nurse Case Manager Clinical Goal(s):   Patient will consent to referral  to C3 team for transportation resources. Goal met 03/07/2018  Over the next 2 weeks patient will verbalize receipt of transportation resources from C3 team.  Interventions:  . Discussed in depth patients need for transportation . Referral placed to C3 team . Obtained consent for CCM Services      Plan: CCM RN CM will refer to C3 for financial difficulties. Will call patient back next week to discuss face to face appointment for disease management education and medication assistance.     Anginette Espejo E. Suzie Portela, RN, BSN Nurse Care Coordinator [facility] Care Management  [phone]

## 2018-03-10 ENCOUNTER — Telehealth: Payer: Self-pay

## 2018-03-10 NOTE — Telephone Encounter (Signed)
Copied from [place] [id number]. Topic: Referral - Status >> Mar 10, 2018  8:38 PM Simone Curia D wrote: 1/84/0375 called left msg with my name and number to return call re: community resources. Will attempt to call again 03/11/2018.MA

## 2018-03-11 ENCOUNTER — Telehealth: Payer: Self-pay

## 2018-03-11 NOTE — Telephone Encounter (Signed)
Copied from [place] [id number]. Topic: Referral - Status >> Mar 11, 2018  9:12 PM Simone Curia D wrote: 2/58/3462 Spoke with patient about community resources for transportation and in-home aide.MA >> Mar 11, 2018  1:94 PM Simone Curia D wrote: 09/06/5269 Spoke with patient about community resources for transportation and in-home aide.MA

## 2018-03-13 ENCOUNTER — Ambulatory Visit: Payer: Self-pay

## 2018-03-13 DIAGNOSIS — F3132 Bipolar disorder, current episode depressed, moderate: Secondary | ICD-10-CM

## 2018-03-13 DIAGNOSIS — J449 Chronic obstructive pulmonary disease, unspecified: Secondary | ICD-10-CM

## 2018-03-13 DIAGNOSIS — Z720 Tobacco use: Secondary | ICD-10-CM

## 2018-03-13 NOTE — Chronic Care Management (AMB) (Signed)
  Chronic Care Management Note  Nicole Austin is a 58 year old female who sees Dr. Mikki Harbor primary care. Dr. Sanda Klein asked the CCM team to consult the patient for assistance with chronic disease management related tofinancial insecurities, transportation, and  housing. Referral was placed1/03/2018. Telephone outreach 03/07/2018 to introduce CCM services.Patient agreed to services but did not have time to discuss healthcare goals/needs. Referral was place to C3 and per EMR documentation, contacted patient on 03/11/2018.   Was unable to reach patient via telephone today for follow up and to see if patient would like to engage with CCM RN CM/Clinic Pharmacist for disease management. I have left HIPAA compliant voicemail asking patient to return my call. (unsuccessful outreach #1).  Plan: Will follow-up within 3-5  business days via telephone.     [name] E. Rollene Rotunda, RN, BSN Nurse Care Coordinator [facility] Care Management  [phone]

## 2018-03-17 ENCOUNTER — Ambulatory Visit (INDEPENDENT_AMBULATORY_CARE_PROVIDER_SITE_OTHER): Payer: Medicare Other

## 2018-03-17 DIAGNOSIS — Z720 Tobacco use: Secondary | ICD-10-CM

## 2018-03-17 DIAGNOSIS — J449 Chronic obstructive pulmonary disease, unspecified: Secondary | ICD-10-CM

## 2018-03-17 DIAGNOSIS — F3132 Bipolar disorder, current episode depressed, moderate: Secondary | ICD-10-CM | POA: Diagnosis not present

## 2018-03-17 DIAGNOSIS — F1910 Other psychoactive substance abuse, uncomplicated: Secondary | ICD-10-CM

## 2018-03-17 NOTE — Patient Instructions (Signed)
1. Please contact the CCM Team with any needs, concerns, or questions.  CCM (Chronic Care Management) Team   Trish Fountain RN, BSN Nurse Care Coordinator  [phone]  Ruben Reason PharmD  Clinical Pharmacist  [phone]

## 2018-03-17 NOTE — Chronic Care Management (AMB) (Signed)
Chronic Care Management   Follow Up Note   03/17/2018 Name: Nicole Austin MRN: [id number] DOB: September 28, 1960  Referred by: Kerman Passey, MD Reason for referral : Chronic Care Management (follow up to schedule face to face with CCM Team)    Subjective: "I have no way to appointments right now until my sister can drive" "Aldean Jewett has given me numbers to call for transportation"   Objective:  Lab Results  Component Value Date   HGBA1C 5.5 02/27/2018    BP Readings from Last 3 Encounters:  02/27/18 138/74  02/27/18 138/74  10/08/17 107/71     Assessment: Nicole Austin is a 57 year old female who sees Dr. Vickey Huger primary care. Dr. Sherie Don asked the CCM team to consult the patient for assistance with chronic disease management related tofinancial insecurities, transportation, and  housing. Referral was placed1/03/2018. Telephone outreach to patient today to follow up with C3 referral and to see if patient would like to work with CCM Team regarding chronic disease management secondary to COPD.  Patient states she has been contacted by the C3 team and provided information (numbers to call) to secure public transportation now that she has moved into her own apartment in [place]. Confirmed C3 contact via EMR documentation. Patient is now in [facility] and will utilize [employer]. Patient has also been provided with DSS number to request home aide through her Medicaid benefit. Patient states she is able to make these call independently and does not need CCM to assist.  Patient is not interested in schedule appointment with the CCM Team to discuss chronic disease management at this time. Currently her sister provides her transportation and is recovering from recent surgery. Sister does not have follow up appointment until later this week to see if she is allowed to drive.  Ms. Wider denies needs for medication assistance and feels her COPD is stable at  this time.   Follow Up Plan: Will follow up with patient next week to see if calls have been made to transportation resources and to schedule face to face with CCM Team     Zi Sek E. Suzie Portela, RN, BSN Nurse Care Coordinator [facility] Care Management  [phone]

## 2018-03-18 ENCOUNTER — Telehealth: Payer: Self-pay

## 2018-03-24 ENCOUNTER — Telehealth: Payer: Self-pay | Admitting: *Deleted

## 2018-03-24 NOTE — Telephone Encounter (Signed)
Called pt to remind her of her appt for ldct screening pt unable to keep appt at this time, just moved and does not have transportation, needs to reschedule.  Would like to be called back in about 1 month.

## 2018-03-25 ENCOUNTER — Ambulatory Visit: Payer: Medicare Other

## 2018-03-25 ENCOUNTER — Inpatient Hospital Stay: Payer: Medicare Other | Admitting: Oncology

## 2018-03-27 ENCOUNTER — Telehealth: Payer: Self-pay

## 2018-03-28 ENCOUNTER — Telehealth: Payer: Self-pay

## 2018-03-28 ENCOUNTER — Ambulatory Visit: Payer: Self-pay

## 2018-03-28 DIAGNOSIS — J449 Chronic obstructive pulmonary disease, unspecified: Secondary | ICD-10-CM

## 2018-03-28 DIAGNOSIS — Z720 Tobacco use: Secondary | ICD-10-CM

## 2018-03-28 NOTE — Chronic Care Management (AMB) (Signed)
Chronic Care Management   Note  03/28/2018 Name: Nicole Austin MRN: [id number] DOB: [birth year]  Assessment: Vena Rua Combsis a 58year old female who sees Dr. Vickey Huger primary care.Dr. Oneita Hurt the CCM team to consult the patient for assistance with chronic disease management related tofinancial insecurities, transportation, and housing. Referral was placed1/03/2018. Telephone outreach to patient today to confirm patient has engaged with transportation resource and to possible schedule appointment for initial visit with CCM Team if her transportation barrier has been resolved. She previously declined assistance from CCM Team secondary to transportation barrier.  Was unable to reach patient via telephone today for follow up and to see if patient would like to engage with CCM RN CM/Clinic Pharmacist for disease management. I have left HIPAA compliant voicemail asking patient to return my call. (unsuccessful outreach #2).   The CM team will reach out to the patient again over the next 7 days.     Oyindamola Key E. Suzie Portela, RN, BSN Nurse Care Coordinator [facility] Care Management  [phone]

## 2018-04-02 ENCOUNTER — Other Ambulatory Visit: Payer: Self-pay | Admitting: Family Medicine

## 2018-04-03 ENCOUNTER — Encounter: Payer: Self-pay | Admitting: Family Medicine

## 2018-04-04 ENCOUNTER — Ambulatory Visit: Payer: Self-pay

## 2018-04-04 DIAGNOSIS — Z599 Problem related to housing and economic circumstances, unspecified: Secondary | ICD-10-CM

## 2018-04-04 DIAGNOSIS — J449 Chronic obstructive pulmonary disease, unspecified: Secondary | ICD-10-CM

## 2018-04-04 DIAGNOSIS — Z720 Tobacco use: Secondary | ICD-10-CM

## 2018-04-04 NOTE — Chronic Care Management (AMB) (Signed)
Chronic Care Management   Note  04/04/2018 Name: Nicole Austin MRN: [id number] DOB: 11/11/1960  Assessment:Etoile Sallye Ober Combsis a 58year old female who sees Dr. Vickey Huger primary care.Dr. Oneita Hurt the CCM team to consult the patient for assistance with chronic disease management related tofinancial insecurities, transportation, and housing. Referral was placed1/03/2018. Telephone outreach to patient today to confirm patient has engaged with transportation resource and to possible schedule appointment for initial visit with CCM Team if her transportation barrier has been resolved. She previously declined assistance from CCM Team secondary to transportation barrier.  Was unable to reach patient via telephone today forfollow up and to see if patient would like to engage with CCM RN CM/Clinic Pharmacist for disease management. Ihave left HIPAA compliant voicemail asking patient to return my call. (unsuccessful outreach #3).  We will discontinue outreach calls but will gladly be available at any time to provide care management services to Ms. [name].   Mikhi Athey E. Suzie Portela, RN, BSN Nurse Care Coordinator [facility] Care Management  [phone]

## 2018-04-25 ENCOUNTER — Encounter: Payer: Self-pay | Admitting: Family Medicine

## 2018-04-28 ENCOUNTER — Other Ambulatory Visit: Payer: Self-pay | Admitting: Family Medicine

## 2018-04-30 ENCOUNTER — Telehealth: Payer: Self-pay | Admitting: *Deleted

## 2018-04-30 NOTE — Telephone Encounter (Signed)
Received referral for initial lung cancer screening scan. Contacted patient and obtained smoking history,(current, 36 pack year) as well as answering questions related to screening process. Patient denies signs of lung cancer such as weight loss or hemoptysis. Patient denies comorbidity that would prevent curative treatment if lung cancer were found. Patient is scheduled for shared decision making visit and CT scan on 05/16/18 at 2pm.

## 2018-05-02 ENCOUNTER — Ambulatory Visit (INDEPENDENT_AMBULATORY_CARE_PROVIDER_SITE_OTHER): Payer: Medicare Other | Admitting: Family Medicine

## 2018-05-02 ENCOUNTER — Other Ambulatory Visit (HOSPITAL_COMMUNITY)
Admission: RE | Admit: 2018-05-02 | Discharge: 2018-05-02 | Disposition: A | Discharge status: Home / Self Care | Payer: Medicare Other | Source: Ambulatory Visit | Attending: Family Medicine | Admitting: Family Medicine

## 2018-05-02 ENCOUNTER — Encounter: Payer: Self-pay | Admitting: Family Medicine

## 2018-05-02 VITALS — BP 118/70 | HR 67 | Temp 98.0°F | Ht 62.0 in | Wt 216.7 lb

## 2018-05-02 DIAGNOSIS — M25562 Pain in left knee: Secondary | ICD-10-CM | POA: Diagnosis not present

## 2018-05-02 DIAGNOSIS — Z124 Encounter for screening for malignant neoplasm of cervix: Secondary | ICD-10-CM | POA: Insufficient documentation

## 2018-05-02 DIAGNOSIS — Z0000 Encounter for general adult medical examination without abnormal findings: Secondary | ICD-10-CM | POA: Diagnosis not present

## 2018-05-02 DIAGNOSIS — N898 Other specified noninflammatory disorders of vagina: Secondary | ICD-10-CM | POA: Diagnosis not present

## 2018-05-02 DIAGNOSIS — R5383 Other fatigue: Secondary | ICD-10-CM | POA: Diagnosis not present

## 2018-05-02 DIAGNOSIS — Z23 Encounter for immunization: Secondary | ICD-10-CM | POA: Diagnosis not present

## 2018-05-02 DIAGNOSIS — Z1151 Encounter for screening for human papillomavirus (HPV): Secondary | ICD-10-CM | POA: Insufficient documentation

## 2018-05-02 NOTE — Progress Notes (Signed)
BP 118/70   Pulse 67   Temp 98 F (36.7 C)   Ht _0  (1.575 m)   Wt 216 lb 11.2 oz (98.3 kg)   LMP  (LMP Unknown)   SpO2 93%   BMI 39.63 kg/m    Subjective:    Patient ID: Nicole Austin, female    DOB: [birth year], 58 y.o.   MRN: [id number]  HPI: Nicole Austin is a 58 y.o. female  Chief Complaint  Patient presents with  . Annual Exam    with pap    HPI USPSTF grade A and B recommendations Depression:  Depression screen [facility] 2/9 05/02/2018 02/27/2018 10/04/2017 08/19/2017 07/18/2017  Decreased Interest 0 0 3 2 0  Down, Depressed, Hopeless 0 _1 0  PHQ - 2 Score 0 _2 0  Altered sleeping 0 0 3 3 -  Tired, decreased energy 0 _3 -  Change in appetite 0 _4 -  Feeling bad or failure about yourself  0 0 3 3 -  Trouble concentrating 0 _5 -  Moving slowly or fidgety/restless 0 0 3 1 -  Suicidal thoughts 0 0 2 2 -  PHQ-9 Score 0 _6 -  Difficult doing work/chores Not difficult at all Somewhat difficult Somewhat difficult Extremely dIfficult -  Some recent data might be hidden  MD note: not depressed  Hypertension: BP Readings from Last 3 Encounters:  05/02/18 118/70  02/27/18 138/74  02/27/18 138/74   Obesity: working on this, clothes are fitting differently, 3x to 2x; jeans size dropped Wt Readings from Last 3 Encounters:  05/02/18 216 lb 11.2 oz (98.3 kg)  02/27/18 220 lb 4.8 oz (99.9 kg)  02/27/18 220 lb 4.8 oz (99.9 kg)   BMI Readings from Last 3 Encounters:  05/02/18 39.63 kg/m  02/27/18 41.63 kg/m  02/27/18 41.63 kg/m     Skin cancer: nothing worrisome; SK on the left shoulder Lung cancer:  Smoker 1/2 ppd; smoking since 18; 39 years total, most years were close to 1 ppd; now down to 1/2 ppd, sounds like 30 pack years or more; scheduled for chest CT March 20th; continue yearly until she has been quit for 70 years or reaches age 37 Breast cancer: still dealing with transportation; will get mammogram soon; no lumps or bumps Colorectal  cancer: overdue and she will call Cervical cancer screening: when she was a teenager, had "cancer" of the cervix at age 15 or 83; they froze it; they never took it out; managed to have 3 boys naturally; all other paps after were normal BRCA gene screening: family hx of breast and/or ovarian cancer and/or metastatic prostate cancer? Not sure HIV, hep B, hep C: will get STD testing and prevention (chl/gon/syphilis): will get Intimate partner violence: no abuse Contraception: "that is not possible", tubes tied in 1984 Osteoporosis: no bony abnormalities on previous imaging Fall prevention/vitamin D: discussed Immunizations: shingrix and flu today; all other UTD Diet: eating good things Exercise: gets out breath real fast, can walk to mailbox, chores keep her active Alcohol:    Office Visit from 05/02/2018 in [facility]  AUDIT-C Score  0     Tobacco use: current, encouraged to quit AAA: n/a Aspirin: taking aspirin daily, no bleeding Glucose: check today Lipids: check today    Depression screen [facility] 2/9 05/02/2018 02/27/2018 10/04/2017 08/19/2017 07/18/2017  Decreased Interest 0 0 3 2 0  Down, Depressed, Hopeless 0 1 3  3 0  PHQ - 2 Score 0 _0 0  Altered sleeping 0 0 3 3 -  Tired, decreased energy 0 _1 -  Change in appetite 0 _2 -  Feeling bad or failure about yourself  0 0 3 3 -  Trouble concentrating 0 _3 -  Moving slowly or fidgety/restless 0 0 3 1 -  Suicidal thoughts 0 0 2 2 -  PHQ-9 Score 0 _4 -  Difficult doing work/chores Not difficult at all Somewhat difficult Somewhat difficult Extremely dIfficult -  Some recent data might be hidden  MD note: not depressed  Fall Risk  05/02/2018 02/27/2018 02/27/2018 10/04/2017 08/19/2017  Falls in the past year? _5 No No  Comment - - - - -  Number falls in past yr: _6 - -  Comment - - - - -  Injury with Fall? 0 0 0 - -  Comment - tripped in house, no major injury - - -  Risk Factor Category  - - - - -    Risk for fall due to : - History of fall(s) - - -  Follow up - Falls evaluation completed;Falls prevention discussed - - -  Comment - - - - -    Relevant past medical, surgical, family and social history reviewed Past Medical History:  Diagnosis Date  . ADHD (attention deficit hyperactivity disorder)   . Apnea, sleep 06/30/2015  . Arthritis   . Asthma   . Asthma with acute exacerbation 06/30/2015  . Bipolar 1 disorder ([place])   . COPD (chronic obstructive pulmonary disease) ([place])   . COPD, moderate ([place]) 11/18/2013   Overview:  Last Assessment & Plan:  Refer to pulmonologist Overview:  Overview:  Last Assessment & Plan:  Pneumonia vaccine offered and given today; she did not tolerate LABA; will start her on Spiriva; stop combivent neb; use only SABA neb OR inhaler, not both; smoking cessation urged, and I am here to help if she wants assistance quitting; f/u in 4 weeks Last Assessment & Plan:  Pneumonia vaccin  . Depression   . GERD (gastroesophageal reflux disease) 08/30/2015  . History of acute myocardial infarction 06/30/2015   Overview:  [facility]   . Hyperlipidemia   . Low HDL (under 40) 08/30/2015  . MI (myocardial infarction) ([place])   . Parkinson disease ([place])   . Pre-diabetes   . Prediabetes 05/20/2016  . PTSD (post-traumatic stress disorder)   . Rhabdomyolysis   . Sleep apnea 06/30/2015  . Stage 3 severe COPD by GOLD classification ([place]) 11/18/2013  . Stroke ([place])   . Uncomplicated asthma 09/27/1570  . Varicella 02/25/2017   Past Surgical History:  Procedure Laterality Date  . BACK SURGERY    . COLONOSCOPY WITH PROPOFOL N/A 08/07/2017   Procedure: COLONOSCOPY WITH PROPOFOL;  Surgeon: Virgel Manifold, MD;  Location: [facility] ENDOSCOPY;  Service: Endoscopy;  Laterality: N/A;  . JOINT REPLACEMENT    . REPLACEMENT TOTAL KNEE Left    Family History  Problem Relation Age of Onset  . Hernia Mother   . Heart disease Mother   . OCD Mother   . Diabetes Mother   . Parkinson's disease Father    . Bipolar disorder Sister   . Schizophrenia Sister   . ADD / ADHD Sister   . Alcohol abuse Brother   . Bipolar disorder Sister   . Paranoid behavior Sister   . ADD / ADHD Sister   .  ADD / ADHD Son   . Dementia Maternal Grandmother   . Emphysema Maternal Grandfather   . ADD / ADHD Son   . ADD / ADHD Son   . Depression Son    Social History   Tobacco Use  . Smoking status: Current Every Day Smoker    Packs/day: 0.50    Years: 36.00    Pack years: 18.00    Types: Cigarettes    Start date: 10/11/1984  . Smokeless tobacco: Never Used  Substance Use Topics  . Alcohol use: No    Alcohol/week: 0.0 standard drinks  . Drug use: No     Office Visit from 05/02/2018 in [facility]  AUDIT-C Score  0      Interim medical history since last visit reviewed. Allergies and medications reviewed  Review of Systems  Constitutional: Negative for fever and unexpected weight change (not unexplected; patient is working hard).  HENT: Negative for hearing loss.   Eyes: Negative for visual disturbance.  Gastrointestinal: Negative for blood in stool.  Endocrine: Positive for polydipsia.  Genitourinary: Negative for hematuria.  Skin:       No worrisome moles  Allergic/Immunologic: Negative for food allergies.  Neurological: Positive for tremors (the same).  Hematological: Negative for adenopathy.   Per HPI unless specifically indicated above     Objective:    BP 118/70   Pulse 67   Temp 98 F (36.7 C)   Ht '5\' 2"'$  (1.575 m)   Wt 216 lb 11.2 oz (98.3 kg)   LMP  (LMP Unknown)   SpO2 93%   BMI 39.63 kg/m   Wt Readings from Last 3 Encounters:  05/02/18 216 lb 11.2 oz (98.3 kg)  02/27/18 220 lb 4.8 oz (99.9 kg)  02/27/18 220 lb 4.8 oz (99.9 kg)    Physical Exam Constitutional:      General: She is not in acute distress.    Appearance: She is well-developed. She is obese.  HENT:     Head: Normocephalic and atraumatic.  Eyes:     General: No scleral icterus.        Right eye: No hordeolum.        Left eye: No hordeolum.     Conjunctiva/sclera: Conjunctivae normal.  Neck:     Thyroid: No thyromegaly.     Vascular: No carotid bruit.  Cardiovascular:     Rate and Rhythm: Normal rate and regular rhythm.  No extrasystoles are present.    Heart sounds: Normal heart sounds, S1 normal and S2 normal.  Pulmonary:     Effort: Pulmonary effort is normal. No respiratory distress.     Breath sounds: Normal breath sounds.  Chest:     Breasts: Breasts are symmetrical.        Right: No inverted nipple, mass, nipple discharge, skin change or tenderness.        Left: No inverted nipple, mass, nipple discharge, skin change or tenderness.  Abdominal:     General: Bowel sounds are normal. There is no distension or abdominal bruit.     Palpations: Abdomen is soft. There is no mass or pulsatile mass.     Tenderness: There is no abdominal tenderness.     Hernia: No hernia is present.  Genitourinary:    Exam position: Prone.     Labia:        Right: No rash or lesion.        Left: No rash or lesion.      Cervix:  No cervical motion tenderness.     Adnexa:        Right: No mass, tenderness or fullness.         Left: No mass, tenderness or fullness.    Musculoskeletal: Normal range of motion.  Lymphadenopathy:     Head:     Right side of head: No submandibular adenopathy.     Left side of head: No submandibular adenopathy.     Cervical: No cervical adenopathy.  Skin:    General: Skin is warm and dry.     Coloration: Skin is not pale.     Findings: No bruising or ecchymosis.  Neurological:     Mental Status: She is alert.     Cranial Nerves: No cranial nerve deficit.     Motor: No tremor or abnormal muscle tone.     Gait: Gait normal.  Psychiatric:        Mood and Affect: Mood is not anxious or depressed.        Speech: Speech normal.        Behavior: Behavior normal.        Thought Content: Thought content normal.       Assessment & Plan:    Problem List Items Addressed This Visit      Other   Morbid obesity ([place])    BMI > 35 with depression, COPD, GERD; encouraged weight loss       Other Visit Diagnoses    Preventative health care    -  Primary   Relevant Orders   CBC (Completed)   COMPLETE METABOLIC PANEL WITH GFR (Completed)   Lipid panel (Completed)   TSH (Completed)   Need for influenza vaccination       Relevant Orders   Flu Vaccine QUAD 6+ mos PF IM (Fluarix Quad PF) (Completed)   Need for shingles vaccine       Vaginal discharge       Relevant Orders   Cervicovaginal ancillary only (Completed)   Screening for cervical cancer       Relevant Orders   Cytology - PAP (Completed)   Left knee pain, unspecified chronicity       Relevant Orders   Ambulatory referral to Orthopedic Surgery       Follow up plan: Return in about 3 months (around 08/02/2018) for Shingrix booster only; one year for next physical.  An after-visit summary was printed and given to the patient at [place].  Please see the patient instructions which may contain other information and recommendations beyond what is mentioned above in the assessment and plan.  No orders of the defined types were placed in this encounter.   Orders Placed This Encounter  Procedures  . Flu Vaccine QUAD 6+ mos PF IM (Fluarix Quad PF)  . CBC  . COMPLETE METABOLIC PANEL WITH GFR  . Lipid panel  . TSH  . Ambulatory referral to Orthopedic Surgery

## 2018-05-02 NOTE — Patient Instructions (Addendum)
Please do contact Dr. Bonna Gains to schedule your repeat colonoscopy (overdue) I do encourage you to quit smoking Call [phone] to sign up for smoking cessation classes You can call 1-800-QUIT-NOW to talk with a smoking cessation coach  Check out the information at familydoctor.org entitled "Nutrition for Weight Loss: What You Need to Know about Fad Diets" Try to lose between 1-2 pounds per week by taking in fewer calories and burning off more calories You can succeed by limiting portions, limiting foods dense in calories and fat, becoming more active, and drinking 8 glasses of water a day (64 ounces) Don't skip meals, especially breakfast, as skipping meals may alter your metabolism Do not use over-the-counter weight loss pills or gimmicks that claim rapid weight loss A healthy BMI (or body mass index) is between 18.5 and 24.9 You can calculate your ideal BMI at the [place] website [employer]fr   Obesity, Adult Obesity is the condition of having too much total body fat. Being overweight or obese means that your weight is greater than what is considered healthy for your body size. Obesity is determined by a measurement called BMI. BMI is an estimate of body fat and is calculated from height and weight. For adults, a BMI of 30 or higher is considered obese. Obesity can eventually lead to other health concerns and major illnesses, including:  Stroke.  Coronary artery disease (CAD).  Type 2 diabetes.  Some types of cancer, including cancers of the colon, breast, uterus, and gallbladder.  Osteoarthritis.  High blood pressure (hypertension).  High cholesterol.  Sleep apnea.  Gallbladder stones.  Infertility problems. What are the causes? The main cause of obesity is taking in (consuming) more calories than your body uses for energy. Other factors that contribute to this condition may include:  Being born with genes that make you  more likely to become obese.  Having a medical condition that causes obesity. These conditions include: ? Hypothyroidism. ? Polycystic ovarian syndrome (PCOS). ? Binge-eating disorder. ? Cushing syndrome.  Taking certain medicines, such as steroids, antidepressants, and seizure medicines.  Not being physically active (sedentary lifestyle).  Living where there are limited places to exercise safely or buy healthy foods.  Not getting enough sleep. What increases the risk? The following factors may increase your risk of this condition:  Having a family history of obesity.  Being a woman of African-American descent.  Being a man of Hispanic descent. What are the signs or symptoms? Having excessive body fat is the main symptom of this condition. How is this diagnosed? This condition may be diagnosed based on:  Your symptoms.  Your medical history.  A physical exam. Your health care provider may measure: ? Your BMI. If you are an adult with a BMI between 25 and less than 30, you are considered overweight. If you are an adult with a BMI of 30 or higher, you are considered obese. ? The distances around your hips and your waist (circumferences). These may be compared to each other to help diagnose your condition. ? Your skinfold thickness. Your health care provider may gently pinch a fold of your skin and measure it. How is this treated? Treatment for this condition often includes changing your lifestyle. Treatment may include some or all of the following:  Dietary changes. Work with your health care provider and a dietitian to set a weight-loss goal that is healthy and reasonable for you. Dietary changes may include eating: ? Smaller portions. A portion size is the amount of a particular  food that is healthy for you to eat at one time. This varies from person to person. ? Low-calorie or low-fat options. ? More whole grains, fruits, and vegetables.  Regular physical activity. This  may include aerobic activity (cardio) and strength training.  Medicine to help you lose weight. Your health care provider may prescribe medicine if you are unable to lose 1 pound a week after 6 weeks of eating more healthily and doing more physical activity.  Surgery. Surgical options may include gastric banding and gastric bypass. Surgery may be done if: ? Other treatments have not helped to improve your condition. ? You have a BMI of 40 or higher. ? You have life-threatening health problems related to obesity. Follow these instructions at home:  Eating and drinking   Follow recommendations from your health care provider about what you eat and drink. Your health care provider may advise you to: ? Limit fast foods, sweets, and processed snack foods. ? Choose low-fat options, such as low-fat milk instead of whole milk. ? Eat 5 or more servings of fruits or vegetables every day. ? Eat at home more often. This gives you more control over what you eat. ? Choose healthy foods when you eat out. ? Learn what a healthy portion size is. ? Keep low-fat snacks on hand. ? Avoid sugary drinks, such as soda, fruit juice, iced tea sweetened with sugar, and flavored milk. ? Eat a healthy breakfast.  Drink enough water to keep your urine clear or pale yellow.  Do not go without eating for long periods of time (do not fast) or follow a fad diet. Fasting and fad diets can be unhealthy and even dangerous. Physical Activity  Exercise regularly, as told by your health care provider. Ask your health care provider what types of exercise are safe for you and how often you should exercise.  Warm up and stretch before being active.  Cool down and stretch after being active.  Rest between periods of activity. Lifestyle  Limit the time that you spend in front of your TV, computer, or video game system.  Find ways to reward yourself that do not involve food.  Limit alcohol intake to no more than 1 drink  a day for nonpregnant women and 2 drinks a day for men. One drink equals 12 oz of beer, 5 oz of wine, or 1 oz of hard liquor. General instructions  Keep a weight loss journal to keep track of the food you eat and how much you exercise you get.  Take over-the-counter and prescription medicines only as told by your health care provider.  Take vitamins and supplements only as told by your health care provider.  Consider joining a support group. Your health care provider may be able to recommend a support group.  Keep all follow-up visits as told by your health care provider. This is important. Contact a health care provider if:  You are unable to meet your weight loss goal after 6 weeks of dietary and lifestyle changes. This information is not intended to replace advice given to you by your health care provider. Make sure you discuss any questions you have with your health care provider. Document Released: 03/22/2004 Document Revised: 07/18/2015 Document Reviewed: 12/01/2014 Elsevier Interactive Patient Education  2019 [place] Maintenance, Female Adopting a healthy lifestyle and getting preventive care can go a long way to promote health and wellness. Talk with your health care provider about what schedule of regular examinations is right for you.  This is a good chance for you to check in with your provider about disease prevention and staying healthy. In between checkups, there are plenty of things you can do on your own. Experts have done a lot of research about which lifestyle changes and preventive measures are most likely to keep you healthy. Ask your health care provider for more information. Weight and diet Eat a healthy diet  Be sure to include plenty of vegetables, fruits, low-fat dairy products, and lean protein.  Do not eat a lot of foods high in solid fats, added sugars, or salt.  Get regular exercise. This is one of the most important things you can do for your  health. ? Most adults should exercise for at least 150 minutes each week. The exercise should increase your heart rate and make you sweat (moderate-intensity exercise). ? Most adults should also do strengthening exercises at least twice a week. This is in addition to the moderate-intensity exercise. Maintain a healthy weight  Body mass index (BMI) is a measurement that can be used to identify possible weight problems. It estimates body fat based on height and weight. Your health care provider can help determine your BMI and help you achieve or maintain a healthy weight.  For females 29 years of age and older: ? A BMI below 18.5 is considered underweight. ? A BMI of 18.5 to 24.9 is normal. ? A BMI of 25 to 29.9 is considered overweight. ? A BMI of 30 and above is considered obese. Watch levels of cholesterol and blood lipids  You should start having your blood tested for lipids and cholesterol at 59 years of age, then have this test every 5 years.  You may need to have your cholesterol levels checked more often if: ? Your lipid or cholesterol levels are high. ? You are older than 59 years of age. ? You are at high risk for heart disease. Cancer screening Lung Cancer  Lung cancer screening is recommended for adults 2-46 years old who are at high risk for lung cancer because of a history of smoking.  A yearly low-dose CT scan of the lungs is recommended for people who: ? Currently smoke. ? Have quit within the past 15 years. ? Have at least a 30-pack-year history of smoking. A pack year is smoking an average of one pack of cigarettes a day for 1 year.  Yearly screening should continue until it has been 15 years since you quit.  Yearly screening should stop if you develop a health problem that would prevent you from having lung cancer treatment. Breast Cancer  Practice breast self-awareness. This means understanding how your breasts normally appear and feel.  It also means doing  regular breast self-exams. Let your health care provider know about any changes, no matter how small.  If you are in your 20s or 30s, you should have a clinical breast exam (CBE) by a health care provider every 1-3 years as part of a regular health exam.  If you are 79 or older, have a CBE every year. Also consider having a breast X-ray (mammogram) every year.  If you have a family history of breast cancer, talk to your health care provider about genetic screening.  If you are at high risk for breast cancer, talk to your health care provider about having an MRI and a mammogram every year.  Breast cancer gene (BRCA) assessment is recommended for women who have family members with BRCA-related cancers. BRCA-related cancers include: ? Breast. ?  Ovarian. ? Tubal. ? Peritoneal cancers.  Results of the assessment will determine the need for genetic counseling and BRCA1 and BRCA2 testing. Cervical Cancer Your health care provider may recommend that you be screened regularly for cancer of the pelvic organs (ovaries, uterus, and vagina). This screening involves a pelvic examination, including checking for microscopic changes to the surface of your cervix (Pap test). You may be encouraged to have this screening done every 3 years, beginning at age 75.  For women ages 59-65, health care providers may recommend pelvic exams and Pap testing every 3 years, or they may recommend the Pap and pelvic exam, combined with testing for human papilloma virus (HPV), every 5 years. Some types of HPV increase your risk of cervical cancer. Testing for HPV may also be done on women of any age with unclear Pap test results.  Other health care providers may not recommend any screening for nonpregnant women who are considered low risk for pelvic cancer and who do not have symptoms. Ask your health care provider if a screening pelvic exam is right for you.  If you have had past treatment for cervical cancer or a condition  that could lead to cancer, you need Pap tests and screening for cancer for at least 20 years after your treatment. If Pap tests have been discontinued, your risk factors (such as having a new sexual partner) need to be reassessed to determine if screening should resume. Some women have medical problems that increase the chance of getting cervical cancer. In these cases, your health care provider may recommend more frequent screening and Pap tests. Colorectal Cancer  This type of cancer can be detected and often prevented.  Routine colorectal cancer screening usually begins at 59 years of age and continues through 59 years of age.  Your health care provider may recommend screening at an earlier age if you have risk factors for colon cancer.  Your health care provider may also recommend using home test kits to check for hidden blood in the stool.  A small camera at the end of a tube can be used to examine your colon directly (sigmoidoscopy or colonoscopy). This is done to check for the earliest forms of colorectal cancer.  Routine screening usually begins at age 87.  Direct examination of the colon should be repeated every 5-10 years through 59 years of age. However, you may need to be screened more often if early forms of precancerous polyps or small growths are found. Skin Cancer  Check your skin from head to toe regularly.  Tell your health care provider about any new moles or changes in moles, especially if there is a change in a mole's shape or color.  Also tell your health care provider if you have a mole that is larger than the size of a pencil eraser.  Always use sunscreen. Apply sunscreen liberally and repeatedly throughout the day.  Protect yourself by wearing long sleeves, pants, a wide-brimmed hat, and sunglasses whenever you are outside. Heart disease, diabetes, and high blood pressure  High blood pressure causes heart disease and increases the risk of stroke. High blood  pressure is more likely to develop in: ? People who have blood pressure in the high end of the normal range (130-139/85-89 mm Hg). ? People who are overweight or obese. ? People who are African American.  If you are 43-62 years of age, have your blood pressure checked every 3-5 years. If you are 44 years of age or older, have  your blood pressure checked every year. You should have your blood pressure measured twice-once when you are at a hospital or clinic, and once when you are not at a hospital or clinic. Record the average of the two measurements. To check your blood pressure when you are not at a hospital or clinic, you can use: ? An automated blood pressure machine at a pharmacy. ? A home blood pressure monitor.  If you are between 75 years and 68 years old, ask your health care provider if you should take aspirin to prevent strokes.  Have regular diabetes screenings. This involves taking a blood sample to check your fasting blood sugar level. ? If you are at a normal weight and have a low risk for diabetes, have this test once every three years after 59 years of age. ? If you are overweight and have a high risk for diabetes, consider being tested at a younger age or more often. Preventing infection Hepatitis B  If you have a higher risk for hepatitis B, you should be screened for this virus. You are considered at high risk for hepatitis B if: ? You were born in a country where hepatitis B is common. Ask your health care provider which countries are considered high risk. ? Your parents were born in a high-risk country, and you have not been immunized against hepatitis B (hepatitis B vaccine). ? You have HIV or AIDS. ? You use needles to inject street drugs. ? You live with someone who has hepatitis B. ? You have had sex with someone who has hepatitis B. ? You get hemodialysis treatment. ? You take certain medicines for conditions, including cancer, organ transplantation, and autoimmune  conditions. Hepatitis C  Blood testing is recommended for: ? Everyone born from [birth year] through 1966. ? Anyone with known risk factors for hepatitis C. Sexually transmitted infections (STIs)  You should be screened for sexually transmitted infections (STIs) including gonorrhea and chlamydia if: ? You are sexually active and are younger than 59 years of age. ? You are older than 59 years of age and your health care provider tells you that you are at risk for this type of infection. ? Your sexual activity has changed since you were last screened and you are at an increased risk for chlamydia or gonorrhea. Ask your health care provider if you are at risk.  If you do not have HIV, but are at risk, it may be recommended that you take a prescription medicine daily to prevent HIV infection. This is called pre-exposure prophylaxis (PrEP). You are considered at risk if: ? You are sexually active and do not regularly use condoms or know the HIV status of your partner(s). ? You take drugs by injection. ? You are sexually active with a partner who has HIV. Talk with your health care provider about whether you are at high risk of being infected with HIV. If you choose to begin PrEP, you should first be tested for HIV. You should then be tested every 3 months for as long as you are taking PrEP. Pregnancy  If you are premenopausal and you may become pregnant, ask your health care provider about preconception counseling.  If you may become pregnant, take 400 to 800 micrograms (mcg) of folic acid every day.  If you want to prevent pregnancy, talk to your health care provider about birth control (contraception). Osteoporosis and menopause  Osteoporosis is a disease in which the bones lose minerals and strength with aging. This can  result in serious bone fractures. Your risk for osteoporosis can be identified using a bone density scan.  If you are 32 years of age or older, or if you are at risk for  osteoporosis and fractures, ask your health care provider if you should be screened.  Ask your health care provider whether you should take a calcium or vitamin D supplement to lower your risk for osteoporosis.  Menopause may have certain physical symptoms and risks.  Hormone replacement therapy may reduce some of these symptoms and risks. Talk to your health care provider about whether hormone replacement therapy is right for you. Follow these instructions at home:  Schedule regular health, dental, and eye exams.  Stay current with your immunizations.  Do not use any tobacco products including cigarettes, chewing tobacco, or electronic cigarettes.  If you are pregnant, do not drink alcohol.  If you are breastfeeding, limit how much and how often you drink alcohol.  Limit alcohol intake to no more than 1 drink per day for nonpregnant women. One drink equals 12 ounces of beer, 5 ounces of wine, or 1 ounces of hard liquor.  Do not use street drugs.  Do not share needles.  Ask your health care provider for help if you need support or information about quitting drugs.  Tell your health care provider if you often feel depressed.  Tell your health care provider if you have ever been abused or do not feel safe at home. This information is not intended to replace advice given to you by your health care provider. Make sure you discuss any questions you have with your health care provider. Document Released: 08/28/2010 Document Revised: 07/21/2015 Document Reviewed: 11/16/2014 Elsevier Interactive Patient Education  2019 [employer].

## 2018-05-03 LAB — COMPLETE METABOLIC PANEL WITH GFR
AG Ratio: 1.4 (calc) (ref 1.0–2.5)
ALT: 13 U/L (ref 6–29)
AST: 23 U/L (ref 10–35)
Albumin: 3.8 g/dL (ref 3.6–5.1)
Alkaline phosphatase (APISO): 79 U/L (ref 37–153)
BUN/Creatinine Ratio: 9 (calc) (ref 6–22)
BUN: 10 mg/dL (ref 7–25)
CO2: 29 mmol/L (ref 20–32)
Calcium: 9.5 mg/dL (ref 8.6–10.4)
Chloride: 102 mmol/L (ref 98–110)
Creat: 1.13 mg/dL — ABNORMAL HIGH (ref 0.50–1.05)
GFR, EST NON AFRICAN AMERICAN: 54 mL/min/{1.73_m2} — AB (ref >=60)
GFR, Est African American: 62 mL/min/{1.73_m2} (ref >=60)
GLUCOSE: 92 mg/dL (ref 65–99)
Globulin: 2.8 g/dL (calc) (ref 1.9–3.7)
Potassium: 4.5 mmol/L (ref 3.5–5.3)
Sodium: 140 mmol/L (ref 135–146)
Total Bilirubin: 0.4 mg/dL (ref 0.2–1.2)
Total Protein: 6.6 g/dL (ref 6.1–8.1)

## 2018-05-03 LAB — CBC
HCT: 39.3 % (ref 35.0–45.0)
Hemoglobin: 13.5 g/dL (ref 11.7–15.5)
MCH: 31.4 pg (ref 27.0–33.0)
MCHC: 34.4 g/dL (ref 32.0–36.0)
MCV: 91.4 fL (ref 80.0–100.0)
MPV: 11.7 fL (ref 7.5–12.5)
Platelets: 251 10*3/uL (ref 140–400)
RBC: 4.3 10*6/uL (ref 3.80–5.10)
RDW: 12.6 % (ref 11.0–15.0)
WBC: 9.1 10*3/uL (ref 3.8–10.8)

## 2018-05-03 LAB — LIPID PANEL
Cholesterol: 131 mg/dL (ref <200)
HDL: 35 mg/dL — ABNORMAL LOW (ref >=50)
LDL Cholesterol (Calc): 77 mg/dL (calc)
Non-HDL Cholesterol (Calc): 96 mg/dL (calc) (ref <130)
Total CHOL/HDL Ratio: 3.7 (calc) (ref <5.0)
Triglycerides: 106 mg/dL (ref <150)

## 2018-05-03 LAB — TSH: TSH: 1.45 mIU/L (ref 0.40–4.50)

## 2018-05-03 NOTE — Progress Notes (Signed)
Nicole Austin, please let the patient know that her CBC is normal. Her kidney function has declined some, but is pretty stable over the last few years overall. Her kidneys don't work like they did when she was 58 years old. Avoid NSAIDs, stay well-hydrated, don't smoke. LDL cholesterol has gone up some. Really encourage her to work on weight loss and healthy eating. If she could try going vegetarian or vegan one or two days a week, that may help. Her thyroid test is normal.

## 2018-05-06 ENCOUNTER — Telehealth: Payer: Self-pay | Admitting: Family Medicine

## 2018-05-06 LAB — CYTOLOGY - PAP
Diagnosis: NEGATIVE
HPV (WINDOPATH): NOT DETECTED

## 2018-05-06 LAB — CERVICOVAGINAL ANCILLARY ONLY
Bacterial vaginitis: NEGATIVE
Candida vaginitis: NEGATIVE
Chlamydia: NEGATIVE
Neisseria Gonorrhea: NEGATIVE
Trichomonas: NEGATIVE

## 2018-05-06 NOTE — Telephone Encounter (Signed)
Copied from [place] [id number]. Topic: Quick Communication - Rx Refill/Question >> May 06, 2018 10:10 AM Margot Ables wrote: Medication: medication for yeast infection Pt states a medicine was supposed to be sent in Friday but the pharmacy does not have anything. I do not see orders from 05/02/2018.  Has the patient contacted their pharmacy? yes Preferred Pharmacy (with phone number or street name): [place], [place] [facility] [address] [phone] (Phone) [phone] (Fax)

## 2018-05-06 NOTE — Telephone Encounter (Signed)
I don't see the results back yet for the yeast infection testing We usually wait on the results before anything would be called in; I'm sorry if she misunderstood Please check with lab

## 2018-05-06 NOTE — Telephone Encounter (Signed)
Pt called back in to follow up on antibiotic concern. Advised pt per Dr. Delight Ovens response. Pt expressed understanding.

## 2018-05-10 NOTE — Assessment & Plan Note (Signed)
BMI > 35 with depression, COPD, GERD; encouraged weight loss

## 2018-05-15 ENCOUNTER — Telehealth: Payer: Self-pay | Admitting: *Deleted

## 2018-05-15 NOTE — Telephone Encounter (Signed)
Patient notified/message left to notify patient that due to current restrictions lung screening appointments are cancelled and patient will be contacted regarding rescheduling.  

## 2018-05-16 ENCOUNTER — Inpatient Hospital Stay: Payer: Medicare Other | Admitting: Oncology

## 2018-05-16 ENCOUNTER — Ambulatory Visit: Payer: Medicare Other

## 2018-05-22 DIAGNOSIS — E611 Iron deficiency: Secondary | ICD-10-CM | POA: Diagnosis not present

## 2018-05-22 DIAGNOSIS — G2581 Restless legs syndrome: Secondary | ICD-10-CM | POA: Diagnosis not present

## 2018-05-22 DIAGNOSIS — R259 Unspecified abnormal involuntary movements: Secondary | ICD-10-CM | POA: Diagnosis not present

## 2018-05-22 DIAGNOSIS — E538 Deficiency of other specified B group vitamins: Secondary | ICD-10-CM | POA: Diagnosis not present

## 2018-05-27 ENCOUNTER — Other Ambulatory Visit: Payer: Self-pay | Admitting: Neurology

## 2018-05-27 DIAGNOSIS — R259 Unspecified abnormal involuntary movements: Principal | ICD-10-CM

## 2018-05-27 DIAGNOSIS — G252 Other specified forms of tremor: Secondary | ICD-10-CM

## 2018-06-11 ENCOUNTER — Telehealth: Payer: Self-pay

## 2018-06-11 NOTE — Telephone Encounter (Signed)
Copied from [place] [id number]. Topic: General - Other >> Jun 11, 2018 11:09 AM Yvette Rack wrote: Reason for CRM: Pt stated she needs the paperwork for [facility] [employer] to be completed and faxed back as soon as possible because she needs to be able to have a ride to her appts.

## 2018-06-11 NOTE — Telephone Encounter (Signed)
She lives in [place] now and wants to keep same doctors Dr. Sanda Klein and orthopaedist, already established relationships and does not want to change That is fine

## 2018-07-01 ENCOUNTER — Ambulatory Visit: Admission: RE | Admit: 2018-07-01 | Payer: Medicare Other | Source: Ambulatory Visit

## 2018-07-09 ENCOUNTER — Other Ambulatory Visit: Payer: Self-pay | Admitting: Nurse Practitioner

## 2018-07-09 ENCOUNTER — Ambulatory Visit (HOSPITAL_COMMUNITY): Payer: Medicare Other

## 2018-07-09 DIAGNOSIS — M1711 Unilateral primary osteoarthritis, right knee: Secondary | ICD-10-CM | POA: Diagnosis not present

## 2018-07-09 DIAGNOSIS — Z96652 Presence of left artificial knee joint: Secondary | ICD-10-CM | POA: Diagnosis not present

## 2018-07-10 ENCOUNTER — Telehealth: Payer: Self-pay | Admitting: *Deleted

## 2018-07-10 NOTE — Telephone Encounter (Signed)
Received referral for low dose lung cancer screening CT scan. Message left at phone number listed in EMR for patient to call me back to facilitate scheduling scan.  

## 2018-07-17 ENCOUNTER — Ambulatory Visit (HOSPITAL_COMMUNITY)
Admission: RE | Admit: 2018-07-17 | Discharge: 2018-07-17 | Disposition: A | Discharge status: Home / Self Care | Payer: Medicare Other | Source: Ambulatory Visit | Attending: Neurology | Admitting: Neurology

## 2018-07-17 ENCOUNTER — Other Ambulatory Visit: Payer: Self-pay

## 2018-07-17 DIAGNOSIS — R259 Unspecified abnormal involuntary movements: Secondary | ICD-10-CM | POA: Diagnosis not present

## 2018-07-17 DIAGNOSIS — G252 Other specified forms of tremor: Secondary | ICD-10-CM

## 2018-07-17 DIAGNOSIS — R251 Tremor, unspecified: Secondary | ICD-10-CM | POA: Diagnosis not present

## 2018-07-25 ENCOUNTER — Other Ambulatory Visit: Payer: Self-pay

## 2018-07-25 ENCOUNTER — Ambulatory Visit (INDEPENDENT_AMBULATORY_CARE_PROVIDER_SITE_OTHER): Payer: Medicare Other

## 2018-07-25 DIAGNOSIS — Z23 Encounter for immunization: Secondary | ICD-10-CM

## 2018-08-07 ENCOUNTER — Telehealth: Payer: Self-pay | Admitting: *Deleted

## 2018-08-07 NOTE — Telephone Encounter (Signed)
Attempted to contact patient to schedule lung screening scan. However, the phone was hung up twice as soon as it was answered. Will be mailing notification.

## 2018-08-26 DIAGNOSIS — Z96652 Presence of left artificial knee joint: Secondary | ICD-10-CM | POA: Diagnosis not present

## 2018-08-26 DIAGNOSIS — M179 Osteoarthritis of knee, unspecified: Secondary | ICD-10-CM | POA: Diagnosis not present

## 2018-09-01 ENCOUNTER — Encounter: Payer: Self-pay | Admitting: *Deleted

## 2018-09-16 ENCOUNTER — Ambulatory Visit: Payer: Medicare Other | Admitting: Nurse Practitioner

## 2018-10-01 ENCOUNTER — Other Ambulatory Visit: Payer: Self-pay | Admitting: Family Medicine

## 2018-10-08 DIAGNOSIS — H2513 Age-related nuclear cataract, bilateral: Secondary | ICD-10-CM | POA: Diagnosis not present

## 2018-10-14 ENCOUNTER — Other Ambulatory Visit: Payer: Self-pay

## 2018-10-14 ENCOUNTER — Encounter: Payer: Self-pay | Admitting: Nurse Practitioner

## 2018-10-14 ENCOUNTER — Ambulatory Visit (INDEPENDENT_AMBULATORY_CARE_PROVIDER_SITE_OTHER): Payer: Medicare Other | Admitting: Nurse Practitioner

## 2018-10-14 DIAGNOSIS — G4730 Sleep apnea, unspecified: Secondary | ICD-10-CM | POA: Diagnosis not present

## 2018-10-14 DIAGNOSIS — J449 Chronic obstructive pulmonary disease, unspecified: Secondary | ICD-10-CM

## 2018-10-14 DIAGNOSIS — H259 Unspecified age-related cataract: Secondary | ICD-10-CM

## 2018-10-14 DIAGNOSIS — F411 Generalized anxiety disorder: Secondary | ICD-10-CM | POA: Diagnosis not present

## 2018-10-14 DIAGNOSIS — K219 Gastro-esophageal reflux disease without esophagitis: Secondary | ICD-10-CM

## 2018-10-14 DIAGNOSIS — R251 Tremor, unspecified: Secondary | ICD-10-CM

## 2018-10-14 DIAGNOSIS — E786 Lipoprotein deficiency: Secondary | ICD-10-CM

## 2018-10-14 DIAGNOSIS — F3132 Bipolar disorder, current episode depressed, moderate: Secondary | ICD-10-CM

## 2018-10-14 DIAGNOSIS — Z720 Tobacco use: Secondary | ICD-10-CM

## 2018-10-14 MED ORDER — ATORVASTATIN CALCIUM 40 MG PO TABS: 40.0000 mg | ORAL_TABLET | Freq: Every day | ORAL | 1 refills | Status: DC

## 2018-10-14 NOTE — Progress Notes (Signed)
Telephone Note  I connected with Nicole Austin on 10/14/18 at  4:00 PM EDT by telephone and verified that I am speaking with the correct person using two identifiers.   Staff discussed the limitations of evaluation and management by telemedicine and the availability of in person appointments. The patient expressed understanding and agreed to proceed.  Patient location: home  My location: work office Other people present:  none HPI  COPD & Asthma Pulmonologist: does not have one Has not had exacerbation in over a year Takes Anoro daily  Has exertional shortness of breath when cleaning- has to take breaks. Uses albuterol inhaler PRN- maybe once every few months.  Smoking- cut back 0.5 pack  Sleep apnea- states she was told she did not CPAP  Hyperlipidemia Patient rx atorvastatin 40mg  daily Takes medications as prescribed with no missed doses a month.  Diet: eats vegetables daily and Fried foods rarely  Denies myalgias Lab Results  Component Value Date   CHOL 131 05/02/2018   HDL 35 (L) 05/02/2018   LDLCALC 77 05/02/2018   TRIG 106 05/02/2018   CHOLHDL 3.7 05/02/2018    GERD Avoids triggers has not had an issues since   Tremors Neurology- Dr. Manuella Ghazi, her father had parkinsons and she started to notice she was having tremors starting in 2018 and is progressively getting worse in stead of better. She is taking congerntin  Peripheral edema Has been on lasix 20mg  and potassium 10 MEQ PRN to help with lower extremity swelling Last ECHO 2017- EF 60%  Prediabetes Had a history but last A1C was normal.  Lab Results  Component Value Date   HGBA1C 5.5 02/27/2018   Bipolar disorder with depression and anxiety Sees Dr. Carlos Levering Su MD depakote 150mg  BID, Lexapro 20mg  daily, Latuda 60mg  every morning, Xanax TID  PHQ2/9: Depression screen [facility] 2/9 10/14/2018 05/02/2018 02/27/2018 10/04/2017 08/19/2017  Decreased Interest 0 0 0 3 2  Down, Depressed, Hopeless 0 0 1 3 3   PHQ - 2 Score 0 0 1 6 5    Altered sleeping 0 0 0 3 3  Tired, decreased energy 0 0 1 3 3   Change in appetite 0 0 1 3 3   Feeling bad or failure about yourself  0 0 0 3 3  Trouble concentrating 0 0 2 3 3   Moving slowly or fidgety/restless 0 0 0 3 1  Suicidal thoughts 0 0 0 2 2  PHQ-9 Score 0 0 5 26 23   Difficult doing work/chores Not difficult at all Not difficult at all Somewhat difficult Somewhat difficult Extremely dIfficult  Some recent data might be hidden  PHQ reviewed. Negative  Patient Active Problem List   Diagnosis Date Noted  . Benign neoplasm of ascending colon   . Polyp of sigmoid colon   . ADD (attention deficit disorder) 02/25/2017  . Marijuana use 02/13/2017  . Adjustment disorder with mixed disturbance of emotions and conduct 01/10/2017  . Moderate recurrent major depression ([place]) 01/10/2017  . Medication monitoring encounter 10/16/2016  . Prediabetes 05/20/2016  . Neuropathy, peripheral 05/07/2016  . Right sided sciatica 04/19/2016  . Trapezius muscle spasm 12/17/2015  . Facet hypertrophy of lumbar region 11/25/2015  . Vitamin B12 deficiency 10/24/2015  . Fatigue 10/04/2015  . Encounter for medication monitoring 10/04/2015  . Hypoalbuminemia 10/04/2015  . Fibromyalgia 08/30/2015  . Low HDL (under 40) 08/30/2015  . GERD (gastroesophageal reflux disease) 08/30/2015  . OP (osteoporosis) 06/30/2015  . Pain in thoracic spine 06/30/2015  . Primary malignant neoplasm ([place]) 06/30/2015  .  Age-related osteoporosis without current pathological fracture 06/30/2015  . Uncomplicated asthma 15/17/6160  . Sleep apnea 06/30/2015  . Severe bipolar I disorder, current or most recent episode depressed ([place]) 06/30/2015  . Other chronic pain 06/30/2015  . Osteoporosis 06/30/2015  . Chronic pain disorder 04/27/2015  . Chronic pain syndrome 04/27/2015  . Morbid obesity ([place]) 12/22/2014  . Bipolar affective disorder, current episode depressed ([place]) 12/22/2014  . Gout 08/16/2014  . Morbid (severe)  obesity due to excess calories ([place]) 08/16/2014  . Back pain, chronic 08/13/2014  . Low back pain with sciatica 08/04/2014  . Current tobacco use 08/04/2014  . Tobacco abuse 08/04/2014  . Lumbago with sciatica 08/04/2014  . Polysubstance abuse ([place]) 07/04/2014  . Psychoactive substance abuse ([place]) 07/04/2014  . Anxiety, generalized 11/24/2013  . Other abnormalities of gait and mobility 11/24/2013  . Stage 3 severe COPD by GOLD classification ([place]) 11/18/2013  . HPV (human papilloma virus) infection 07/17/2013  . Arthritis of knee, degenerative 07/15/2013  . H/O nonmelanoma skin cancer 07/31/2011    Past Medical History:  Diagnosis Date  . ADHD (attention deficit hyperactivity disorder)   . Apnea, sleep 06/30/2015  . Arthritis   . Asthma   . Asthma with acute exacerbation 06/30/2015  . Bipolar 1 disorder ([place])   . COPD (chronic obstructive pulmonary disease) ([place])   . COPD, moderate ([place]) 11/18/2013   Overview:  Last Assessment & Plan:  Refer to pulmonologist Overview:  Overview:  Last Assessment & Plan:  Pneumonia vaccine offered and given today; she did not tolerate LABA; will start her on Spiriva; stop combivent neb; use only SABA neb OR inhaler, not both; smoking cessation urged, and I am here to help if she wants assistance quitting; f/u in 4 weeks Last Assessment & Plan:  Pneumonia vaccin  . Depression   . GERD (gastroesophageal reflux disease) 08/30/2015  . History of acute myocardial infarction 06/30/2015   Overview:  [facility]   . Hyperlipidemia   . Low HDL (under 40) 08/30/2015  . MI (myocardial infarction) ([place])   . Parkinson disease ([place])   . Pre-diabetes   . Prediabetes 05/20/2016  . PTSD (post-traumatic stress disorder)   . Rhabdomyolysis   . Sleep apnea 06/30/2015  . Stage 3 severe COPD by GOLD classification ([place]) 11/18/2013  . Stroke ([place])   . Uncomplicated asthma 08/28/7104  . Varicella 02/25/2017    Past Surgical History:  Procedure Laterality Date  . BACK SURGERY    .  COLONOSCOPY WITH PROPOFOL N/A 08/07/2017   Procedure: COLONOSCOPY WITH PROPOFOL;  Surgeon: Virgel Manifold, MD;  Location: [facility] ENDOSCOPY;  Service: Endoscopy;  Laterality: N/A;  . JOINT REPLACEMENT    . REPLACEMENT TOTAL KNEE Left     Social History   Tobacco Use  . Smoking status: Current Every Day Smoker    Packs/day: 0.50    Years: 36.00    Pack years: 18.00    Types: Cigarettes    Start date: 10/11/1984  . Smokeless tobacco: Never Used  Substance Use Topics  . Alcohol use: No    Alcohol/week: 0.0 standard drinks     Current Outpatient Medications:  .  acetaminophen (TYLENOL) 500 MG tablet, Take 500 mg by mouth every 6 (six) hours as needed., Disp: , Rfl:  .  albuterol (PROVENTIL HFA;VENTOLIN HFA) 108 (90 Base) MCG/ACT inhaler, Inhale 2 puffs into the lungs every 6 (six) hours as needed for wheezing or shortness of breath., Disp: 1 Inhaler, Rfl: 0 .  albuterol (PROVENTIL) (2.5  MG/3ML) 0.083% nebulizer solution, Take 3 mLs (2.5 mg total) every 4 (four) hours as needed by nebulization for wheezing or shortness of breath., Disp: 75 mL, Rfl: 0 .  Alpha-D-Galactosidase (BEANO PO), Take 1 tablet by mouth as needed., Disp: , Rfl:  .  ALPRAZolam (XANAX) 0.5 MG tablet, Take 0.5 mg by mouth 3 (three) times daily as needed for anxiety or sleep. And bedtime if needed, Disp: , Rfl:  .  ANORO ELLIPTA 62.5-25 MCG/INH AEPB, INHALE 1 PUFF INTO THE LUNGS DAILY TO REPLACE SPIRIVA, Disp: 30 each, Rfl: 11 .  aspirin-acetaminophen-caffeine (EXCEDRIN MIGRAINE) 250-250-65 MG tablet, Take by mouth every 6 (six) hours as needed for headache., Disp: , Rfl:  .  atorvastatin (LIPITOR) 40 MG tablet, TAKE 1 TABLET BY MOUTH AT BEDTIME, Disp: 90 tablet, Rfl: 1 .  benztropine (COGENTIN) 1 MG tablet, Take 1 mg by mouth at bedtime., Disp: , Rfl:  .  divalproex (DEPAKOTE) 250 MG DR tablet, Take 1 tablet (250 mg total) by mouth 2 (two) times daily., Disp: 60 tablet, Rfl: 2 .  escitalopram (LEXAPRO) 20 MG tablet,  Take 1 tablet (20 mg total) by mouth daily., Disp: 30 tablet, Rfl: 2 .  furosemide (LASIX) 20 MG tablet, TAKE 1 TABLET BY MOUTH EVERY 3 DAYS FOR SWELLING., Disp: 10 tablet, Rfl: 2 .  ipratropium-albuterol (DUONEB) 0.5-2.5 (3) MG/3ML SOLN, Take 3 mLs every 6 (six) hours by nebulization., Disp: 360 mL, Rfl: 0 .  lurasidone (LATUDA) 40 MG TABS tablet, Take 60 mg by mouth every evening. , Disp: , Rfl:  .  methocarbamol (ROBAXIN) 500 MG tablet, Take 1 tablet by mouth as needed., Disp: , Rfl:  .  Misc. Devices (BARIATRIC ROLLATOR) MISC, For use when walking; dx: degenerative changes of spine, OA of knee; morbid obesity, COPD, Disp: 1 each, Rfl: 0 .  Multiple Vitamin (MULTIVITAMIN WITH MINERALS) TABS tablet, Take 1 tablet by mouth daily., Disp: , Rfl:  .  potassium chloride (K-DUR) 10 MEQ tablet, TAKE 1 TABLET BY MOUTH EVERY THREE DAYS, Disp: 10 tablet, Rfl: 2 .  promethazine (PHENERGAN) 25 MG tablet, Take 1 tablet (25 mg total) by mouth every 8 (eight) hours as needed for nausea or vomiting., Disp: 20 tablet, Rfl: 0 .  rOPINIRole (REQUIP) 4 MG tablet, TAKE 1 TABLET (4MG  TOTAL) BY MOUTH DAILYAT BEDTIME 2 HOURS BEFORE RLSSYMPTOMS START. BEGIN TAKING AFTER COMPLETING THE2MG  TABLETS, Disp: , Rfl:   Allergies  Allergen Reactions  . Chantix [Varenicline] Nausea Only  . Codeine Nausea And Vomiting    ROS   No other specific complaints in a complete review of systems (except as listed in HPI above).  Objective  There were no vitals filed for this visit.   There is no height or weight on file to calculate BMI.  Nursing Note and Vital Signs reviewed.  Physical Exam    Assessment & Plan  1. COPD with asthma ([place]) Stable, continue inhalers.   2. Sleep apnea, unspecified type Does not use cpap, consider retesting   3. Gastroesophageal reflux disease, esophagitis presence not specified Avoids triggers  4. Anxiety, generalized Follows up with psychiatry, stable   5. Bipolar disorder with   depression ([place]) Follows up with psychiatry, stable   6. Age-related cataract of both eyes, unspecified age-related cataract type Surgery coming up   7. Low HDL (under 40)  - atorvastatin (LIPITOR) 40 MG tablet; Take 1 tablet (40 mg total) by mouth at bedtime.  Dispense: 90 tablet; Refill: 1  8. Current tobacco  use Continue to decrease  9. Tremors of nervous system Follow up with neuro Follow Up Instructions:   3 months  I discussed the assessment and treatment plan with the patient. The patient was provided an opportunity to ask questions and all were answered. The patient agreed with the plan and demonstrated an understanding of the instructions.   The patient was advised to call back or seek an in-person evaluation if the symptoms worsen or if the condition fails to improve as anticipated.  I provided 26 minutes of non-face-to-face time during this encounter.   Fredderick Severance, NP

## 2018-10-21 ENCOUNTER — Telehealth: Payer: Self-pay | Admitting: *Deleted

## 2018-10-21 NOTE — Telephone Encounter (Signed)
Reschedule for lung screening scan. See prior documentation for eligibility information.

## 2018-10-24 DIAGNOSIS — G252 Other specified forms of tremor: Secondary | ICD-10-CM | POA: Diagnosis not present

## 2018-10-29 ENCOUNTER — Encounter: Payer: Self-pay | Admitting: Oncology

## 2018-10-30 ENCOUNTER — Ambulatory Visit: Payer: Medicare Other

## 2018-10-30 ENCOUNTER — Inpatient Hospital Stay: Payer: Medicare Other | Admitting: Oncology

## 2018-11-10 ENCOUNTER — Inpatient Hospital Stay: Payer: Medicare Other | Admitting: Nurse Practitioner

## 2018-11-10 ENCOUNTER — Ambulatory Visit: Payer: Medicaid Other

## 2018-11-10 ENCOUNTER — Other Ambulatory Visit: Payer: Self-pay

## 2018-11-17 ENCOUNTER — Telehealth: Payer: Self-pay | Admitting: *Deleted

## 2018-11-17 NOTE — Telephone Encounter (Signed)
Patient has previously requested a call back this afternoon to reschedule lung screening scan. Attempted to contact and left voicemail for patient to return my call to reschedule.

## 2018-11-18 ENCOUNTER — Ambulatory Visit: Payer: Medicare Other | Attending: Oncology

## 2018-11-18 ENCOUNTER — Inpatient Hospital Stay: Payer: Medicare Other | Admitting: Nurse Practitioner

## 2018-11-27 DIAGNOSIS — J449 Chronic obstructive pulmonary disease, unspecified: Secondary | ICD-10-CM

## 2018-11-27 HISTORY — DX: Chronic obstructive pulmonary disease, unspecified: J44.9

## 2018-12-03 ENCOUNTER — Encounter: Payer: Self-pay | Admitting: Emergency Medicine

## 2018-12-03 ENCOUNTER — Encounter: Payer: Self-pay | Admitting: Family Medicine

## 2018-12-03 ENCOUNTER — Ambulatory Visit (INDEPENDENT_AMBULATORY_CARE_PROVIDER_SITE_OTHER): Payer: Medicare Other | Admitting: Family Medicine

## 2018-12-03 ENCOUNTER — Emergency Department
Admission: EM | Admit: 2018-12-03 | Discharge: 2018-12-03 | Disposition: A | Discharge status: Home / Self Care | Payer: Medicare Other | Attending: Emergency Medicine | Admitting: Emergency Medicine

## 2018-12-03 ENCOUNTER — Other Ambulatory Visit: Payer: Self-pay

## 2018-12-03 DIAGNOSIS — I259 Chronic ischemic heart disease, unspecified: Secondary | ICD-10-CM | POA: Insufficient documentation

## 2018-12-03 DIAGNOSIS — Z79899 Other long term (current) drug therapy: Secondary | ICD-10-CM | POA: Insufficient documentation

## 2018-12-03 DIAGNOSIS — M797 Fibromyalgia: Secondary | ICD-10-CM

## 2018-12-03 DIAGNOSIS — F3163 Bipolar disorder, current episode mixed, severe, without psychotic features: Secondary | ICD-10-CM | POA: Diagnosis not present

## 2018-12-03 DIAGNOSIS — F430 Acute stress reaction: Secondary | ICD-10-CM

## 2018-12-03 DIAGNOSIS — G20 Parkinson's disease: Secondary | ICD-10-CM | POA: Diagnosis not present

## 2018-12-03 DIAGNOSIS — F1910 Other psychoactive substance abuse, uncomplicated: Secondary | ICD-10-CM

## 2018-12-03 DIAGNOSIS — F419 Anxiety disorder, unspecified: Secondary | ICD-10-CM | POA: Diagnosis not present

## 2018-12-03 DIAGNOSIS — F329 Major depressive disorder, single episode, unspecified: Secondary | ICD-10-CM | POA: Diagnosis present

## 2018-12-03 DIAGNOSIS — F411 Generalized anxiety disorder: Secondary | ICD-10-CM

## 2018-12-03 DIAGNOSIS — F603 Borderline personality disorder: Secondary | ICD-10-CM

## 2018-12-03 DIAGNOSIS — J449 Chronic obstructive pulmonary disease, unspecified: Secondary | ICD-10-CM | POA: Insufficient documentation

## 2018-12-03 DIAGNOSIS — F17210 Nicotine dependence, cigarettes, uncomplicated: Secondary | ICD-10-CM | POA: Insufficient documentation

## 2018-12-03 DIAGNOSIS — R4589 Other symptoms and signs involving emotional state: Secondary | ICD-10-CM

## 2018-12-03 LAB — CBC WITH DIFFERENTIAL/PLATELET
Abs Immature Granulocytes: 0.01 10*3/uL (ref 0.00–0.07)
Basophils Absolute: 0.1 10*3/uL (ref 0.0–0.1)
Basophils Relative: 1 %
Eosinophils Absolute: 0.7 10*3/uL — ABNORMAL HIGH (ref 0.0–0.5)
Eosinophils Relative: 8 %
HCT: 39.6 % (ref 36.0–46.0)
Hemoglobin: 13.9 g/dL (ref 12.0–15.0)
Immature Granulocytes: 0 %
Lymphocytes Relative: 36 %
Lymphs Abs: 3.2 10*3/uL (ref 0.7–4.0)
MCH: 30.9 pg (ref 26.0–34.0)
MCHC: 35.1 g/dL (ref 30.0–36.0)
MCV: 88 fL (ref 80.0–100.0)
Monocytes Absolute: 0.7 10*3/uL (ref 0.1–1.0)
Monocytes Relative: 8 %
Neutro Abs: 4.3 10*3/uL (ref 1.7–7.7)
Neutrophils Relative %: 47 %
Platelets: 242 10*3/uL (ref 150–400)
RBC: 4.5 MIL/uL (ref 3.87–5.11)
RDW: 12 % (ref 11.5–15.5)
WBC: 9 10*3/uL (ref 4.0–10.5)
nRBC: 0 % (ref 0.0–0.2)

## 2018-12-03 LAB — URINALYSIS, COMPLETE (UACMP) WITH MICROSCOPIC
Bacteria, UA: NONE SEEN
Bilirubin Urine: NEGATIVE
Glucose, UA: NEGATIVE mg/dL
Hgb urine dipstick: NEGATIVE
Ketones, ur: NEGATIVE mg/dL
Leukocytes,Ua: NEGATIVE
Nitrite: NEGATIVE
Protein, ur: NEGATIVE mg/dL
Specific Gravity, Urine: 1.01 (ref 1.005–1.030)
pH: 6 (ref 5.0–8.0)

## 2018-12-03 LAB — COMPREHENSIVE METABOLIC PANEL
ALT: 15 U/L (ref 0–44)
AST: 24 U/L (ref 15–41)
Albumin: 3.9 g/dL (ref 3.5–5.0)
Alkaline Phosphatase: 67 U/L (ref 38–126)
Anion gap: 9 (ref 5–15)
BUN: 8 mg/dL (ref 6–20)
CO2: 25 mmol/L (ref 22–32)
Calcium: 8.8 mg/dL — ABNORMAL LOW (ref 8.9–10.3)
Chloride: 96 mmol/L — ABNORMAL LOW (ref 98–111)
Creatinine, Ser: 0.89 mg/dL (ref 0.44–1.00)
GFR calc Af Amer: >60 mL/min (ref >60)
GFR calc non Af Amer: >60 mL/min (ref >60)
Glucose, Bld: 92 mg/dL (ref 70–99)
Potassium: 4.2 mmol/L (ref 3.5–5.1)
Sodium: 130 mmol/L — ABNORMAL LOW (ref 135–145)
Total Bilirubin: 0.6 mg/dL (ref 0.3–1.2)
Total Protein: 6.9 g/dL (ref 6.5–8.1)

## 2018-12-03 LAB — URINE DRUG SCREEN, QUALITATIVE (ARMC ONLY)
Amphetamines, Ur Screen: NOT DETECTED
Barbiturates, Ur Screen: NOT DETECTED
Benzodiazepine, Ur Scrn: POSITIVE — AB
Cannabinoid 50 Ng, Ur ~~LOC~~: POSITIVE — AB
Cocaine Metabolite,Ur ~~LOC~~: NOT DETECTED
MDMA (Ecstasy)Ur Screen: NOT DETECTED
Methadone Scn, Ur: NOT DETECTED
Opiate, Ur Screen: NOT DETECTED
Phencyclidine (PCP) Ur S: NOT DETECTED
Tricyclic, Ur Screen: NOT DETECTED

## 2018-12-03 LAB — ACETAMINOPHEN LEVEL: Acetaminophen (Tylenol), Serum: <10 ug/mL — ABNORMAL LOW (ref 10–30)

## 2018-12-03 LAB — ETHANOL: Alcohol, Ethyl (B): <10 mg/dL (ref <10)

## 2018-12-03 LAB — SALICYLATE LEVEL: Salicylate Lvl: <7 mg/dL (ref 2.8–30.0)

## 2018-12-03 MED ORDER — ACETAMINOPHEN 325 MG PO TABS
650.0000 mg | ORAL_TABLET | Freq: Once | ORAL | Status: AC
  Administered 2018-12-03: 650 mg via ORAL
  Filled 2018-12-03: qty 2

## 2018-12-03 NOTE — Progress Notes (Signed)
Name: Nicole Austin   MRN: [id number]    DOB: [birth year]   Date:12/03/2018       Progress Note  Subjective:    Chief Complaint  Chief Complaint  Patient presents with  . Follow-up  . Fatigue    with headaches    I connected with  Mercadies Truxillo Defino on 12/03/18 at  1:20 PM EDT by telephone and verified that I am speaking with the correct person using two identifiers.   I discussed the limitations, risks, security and privacy concerns of performing an evaluation and management service by telephone and the availability of in person appointments. Staff also discussed with the patient that there may be a patient responsible charge related to this service. Patient Location: home Provider Location: [facility] Additional Individuals present: none  HPI  Pt sobbing on the phone, pt new to me, she states that she is exhausted, doesn't feel good, she can't eat, can't sleep, she doesn't want to be around anyone.  She is upset.  Everything hurts.  She lives alone.  "I can't explain everything that's going on, I don't know whats going on and I'm scared. I'm a smoker and I can't even smoke" Totally stressed out and exhausted Restless Having nightmares Very scared She's taking her temperature all the time and its normal   See's psychiatry, has appt Psychiatry the [facility].  she's "so scared"  And screaming and sobbing on the phone says "what's wrong with me, I'm afraid somethings wrong with me"   She lives alone, she cannot drive herself anywhere.  She has not called psychiatry to discuss her feelings/anxiety.  She is is on mood stabilizers and antidepressants she is taking medications as prescribed -including Latuda, Lexapro and Depakote -Cogentin and Xanax are also on her chart.  She wonders if her medicine should be adjusted.  When asked very directly if she is having suicidal thoughts or plan she denies SI.      Patient Active Problem List   Diagnosis Date Noted  . Benign neoplasm of  ascending colon   . Polyp of sigmoid colon   . Bipolar disorder with depression ([place]) 02/25/2017  . ADD (attention deficit disorder) 02/25/2017  . Marijuana use 02/13/2017  . Neuropathy, peripheral 05/07/2016  . Vitamin B12 deficiency 10/24/2015  . Fibromyalgia 08/30/2015  . Low HDL (under 40) 08/30/2015  . GERD (gastroesophageal reflux disease) 08/30/2015  . OP (osteoporosis) 06/30/2015  . Sleep apnea 06/30/2015  . Morbid (severe) obesity due to excess calories ([place]) 08/16/2014  . Low back pain with sciatica 08/04/2014  . Current tobacco use 08/04/2014  . Polysubstance abuse ([place]) 07/04/2014  . Psychoactive substance abuse ([place]) 07/04/2014  . Anxiety, generalized 11/24/2013  . COPD with asthma ([place]) 11/18/2013  . Arthritis of knee, degenerative 07/15/2013    Social History   Tobacco Use  . Smoking status: Current Every Day Smoker    Packs/day: 1.00    Years: 36.00    Pack years: 36.00    Types: Cigarettes  . Smokeless tobacco: Never Used  Substance Use Topics  . Alcohol use: No    Alcohol/week: 0.0 standard drinks     Current Outpatient Medications:  .  acetaminophen (TYLENOL) 500 MG tablet, Take 500 mg by mouth every 6 (six) hours as needed., Disp: , Rfl:  .  albuterol (PROVENTIL HFA;VENTOLIN HFA) 108 (90 Base) MCG/ACT inhaler, Inhale 2 puffs into the lungs every 6 (six) hours as needed for wheezing or shortness of breath., Disp: 1 Inhaler,  Rfl: 0 .  Alpha-D-Galactosidase (BEANO PO), Take 1 tablet by mouth as needed., Disp: , Rfl:  .  ALPRAZolam (XANAX) 0.5 MG tablet, Take 0.5 mg by mouth 3 (three) times daily as needed for anxiety or sleep. And bedtime if needed, Disp: , Rfl:  .  ANORO ELLIPTA 62.5-25 MCG/INH AEPB, INHALE 1 PUFF INTO THE LUNGS DAILY TO REPLACE SPIRIVA, Disp: 30 each, Rfl: 11 .  aspirin-acetaminophen-caffeine (EXCEDRIN MIGRAINE) 250-250-65 MG tablet, Take by mouth every 6 (six) hours as needed for headache., Disp: , Rfl:  .  benztropine (COGENTIN) 1 MG  tablet, Take 1 mg by mouth at bedtime., Disp: , Rfl:  .  divalproex (DEPAKOTE) 250 MG DR tablet, Take 1 tablet (250 mg total) by mouth 2 (two) times daily., Disp: 60 tablet, Rfl: 2 .  escitalopram (LEXAPRO) 20 MG tablet, Take 1 tablet (20 mg total) by mouth daily., Disp: 30 tablet, Rfl: 2 .  furosemide (LASIX) 20 MG tablet, TAKE 1 TABLET BY MOUTH EVERY 3 DAYS FOR SWELLING., Disp: 10 tablet, Rfl: 2 .  ipratropium-albuterol (DUONEB) 0.5-2.5 (3) MG/3ML SOLN, Take 3 mLs every 6 (six) hours by nebulization., Disp: 360 mL, Rfl: 0 .  lurasidone (LATUDA) 40 MG TABS tablet, Take 60 mg by mouth every evening. , Disp: , Rfl:  .  Multiple Vitamin (MULTIVITAMIN WITH MINERALS) TABS tablet, Take 1 tablet by mouth daily., Disp: , Rfl:  .  potassium chloride (K-DUR) 10 MEQ tablet, TAKE 1 TABLET BY MOUTH EVERY THREE DAYS, Disp: 10 tablet, Rfl: 2 .  rOPINIRole (REQUIP) 4 MG tablet, TAKE 1 TABLET (4MG  TOTAL) BY MOUTH DAILYAT BEDTIME 2 HOURS BEFORE RLSSYMPTOMS START. BEGIN TAKING AFTER COMPLETING THE2MG  TABLETS, Disp: , Rfl:  .  atorvastatin (LIPITOR) 40 MG tablet, Take 1 tablet (40 mg total) by mouth at bedtime. (Patient not taking: Reported on 12/03/2018), Disp: 90 tablet, Rfl: 1 .  methocarbamol (ROBAXIN) 500 MG tablet, Take 1 tablet by mouth as needed., Disp: , Rfl:   Allergies  Allergen Reactions  . Chantix [Varenicline] Nausea Only  . Codeine Nausea And Vomiting    I personally reviewed active problem list, medication list, allergies, family history, social history with the patient/caregiver today.  Review of Systems  Unable to perform ROS: Psychiatric disorder (Unable to due to emotional and mental state)     Objective:    Virtual encounter, vitals limited, only able to obtain the following There were no vitals filed for this visit. There is no height or weight on file to calculate BMI. Nursing Note and Vital Signs reviewed.  Physical Exam Neurological:     Mental Status: She is alert.   Psychiatric:        Attention and Perception: She is inattentive.        Mood and Affect: Mood is anxious and depressed. Affect is tearful.        Speech: Speech is rapid and pressured and tangential.        Thought Content: Thought content is paranoid. Thought content does not include suicidal ideation. Thought content does not include suicidal plan.        Judgment: Judgment is impulsive.     Comments: Possibly delusional     PE limited by telephone encounter  No results found for this or any previous visit (from the past 72 hour(s)).  Assessment and Plan:     ICD-10-CM   1. Emotional instability ([place])  F60.3    pt new to me, severly emotionally unstable on phone visit, significant  psych history, 911 to do well check, advised go to ER for psych assessment, since I currently am extremely worried about her safety at home by herself in her current state - 911 called to check on her with our concerns and would like her to get to the ER or [facility]   Some of psych hx below and in chart -   2. Psychoactive substance abuse ([place])  F19.10   3. Polysubstance abuse ([place])  F19.10   4. Anxiety as acute reaction to exceptional stress  F41.1    F43.0   5. Fibromyalgia  M79.7    I did talk to the patient about me calling an ambulance or 911 for her or options of seeing if I could find a crisis hotline or a mobile crisis unit to come to her, she was agreeable to this.  Did call the nonemergency 911 line in [place] to build to go out and check on her and explained her concerns   I provided 21 minutes of non-face-to-face time during this encounter.  Delsa Grana, PA-C 12/03/18 1:44 PM

## 2018-12-03 NOTE — ED Notes (Signed)
Signature pad not available for discharge. Hard copy printed and signed by patient.

## 2018-12-03 NOTE — ED Triage Notes (Signed)
Pt in via POV, reports migraine, fatigue, decreased appetite, decreased sleep.  Pt tearful, states, "I just feel like something is wrong but I dont know whats going on."  NAD noted at this time.

## 2018-12-03 NOTE — ED Notes (Addendum)
Pt brought here by [employer], was on a e-visit with primary Dr for c/o "everything hurting and being wrong with me," Dr requested pt be brought here for a psych eval. Pt is voluntary at this time. Pt tearful during first nurse evaluation. NAD.

## 2018-12-03 NOTE — ED Notes (Signed)
SOC called report given.  Pt. Moved to interview room to talk to [facility].  Pt. Given drink while waiting in interview room.

## 2018-12-03 NOTE — ED Notes (Signed)
Pt states has hx of anxiety and believes her symptoms may be related. Pt states she just doesn't "feel right".

## 2018-12-03 NOTE — ED Provider Notes (Signed)
[facility] Emergency Department Provider Note   ____________________________________________   First MD Initiated Contact with Patient 12/03/18 1533     (approximate)  I have reviewed the triage vital signs and the nursing notes.   HISTORY  Chief Complaint Multiple Complaints    HPI Nicole Austin is a 58 y.o. female with possible history of Parkinson's disease, bipolar disorder, depression, COPD, PTSD, CAD who presents to the ED complaining of "feeling overwhelmed.  Patient reports that for about the past week she has been increasingly depressed.  She denies any specific stressors but states she has been overwhelmed and cannot sleep, states "I feel like something is wrong with me but I do not know what it is".  She denies any fevers, states she has been regularly checking her temperature but it has been normal.  She has not had any cough, chest pain, shortness of breath, abdominal pain, vomiting, diarrhea, or urinary symptoms.  She states she has been taking all of her medications and denies any alcohol or drug abuse.  She specifically denies any suicidal ideation, homicidal ideation, auditory or visual hallucinations.  She spoke with her psychiatrist via phone earlier today and was referred to the ED for psychiatric evaluation.        Past Medical History:  Diagnosis Date   ADHD (attention deficit hyperactivity disorder)    Apnea, sleep 06/30/2015   Arthritis    Asthma    Asthma with acute exacerbation 06/30/2015   Bipolar 1 disorder (HCC)    COPD (chronic obstructive pulmonary disease) (HCC)    COPD, moderate ([place]) 11/18/2013   Overview:  Last Assessment & Plan:  Refer to pulmonologist Overview:  Overview:  Last Assessment & Plan:  Pneumonia vaccine offered and given today; she did not tolerate LABA; will start her on Spiriva; stop combivent neb; use only SABA neb OR inhaler, not both; smoking cessation urged, and I am here to help if she wants  assistance quitting; f/u in 4 weeks Last Assessment & Plan:  Pneumonia vaccin   Depression    GERD (gastroesophageal reflux disease) 08/30/2015   Gout 08/16/2014   History of acute myocardial infarction 06/30/2015   Overview:  [facility]    HPV (human papilloma virus) infection 07/17/2013   Hyperlipidemia    Low HDL (under 40) 08/30/2015   MI (myocardial infarction) ([place])    Parkinson disease ([place])    Pre-diabetes    Prediabetes 05/20/2016   PTSD (post-traumatic stress disorder)    Rhabdomyolysis    Sleep apnea 06/30/2015   Stage 3 severe COPD by GOLD classification ([place]) 11/18/2013   Stroke ([place])    Uncomplicated asthma [id number]   Varicella 02/25/2017    Patient Active Problem List   Diagnosis Date Noted   Benign neoplasm of ascending colon    Polyp of sigmoid colon    Bipolar disorder with depression ([place]) 02/25/2017   ADD (attention deficit disorder) 02/25/2017   Marijuana use 02/13/2017   Neuropathy, peripheral 05/07/2016   Vitamin B12 deficiency 10/24/2015   Fibromyalgia 08/30/2015   Low HDL (under 40) 08/30/2015   GERD (gastroesophageal reflux disease) 08/30/2015   OP (osteoporosis) 06/30/2015   Sleep apnea 06/30/2015   Morbid (severe) obesity due to excess calories ([place]) 08/16/2014   Low back pain with sciatica 08/04/2014   Current tobacco use 08/04/2014   Polysubstance abuse ([place]) 07/04/2014   Psychoactive substance abuse ([place]) 07/04/2014   Anxiety, generalized 11/24/2013   COPD with asthma ([place]) 11/18/2013   Arthritis of  knee, degenerative 07/15/2013    Past Surgical History:  Procedure Laterality Date   BACK SURGERY     COLONOSCOPY WITH PROPOFOL N/A 08/07/2017   Procedure: COLONOSCOPY WITH PROPOFOL;  Surgeon: Virgel Manifold, MD;  Location: [facility] ENDOSCOPY;  Service: Endoscopy;  Laterality: N/A;   JOINT REPLACEMENT     REPLACEMENT TOTAL KNEE Left     Prior to Admission medications   Medication Sig Start Date End Date Taking?  Authorizing Provider  acetaminophen (TYLENOL) 500 MG tablet Take 500-1,000 mg by mouth every 6 (six) hours as needed for mild pain or fever.    Yes [provider]  albuterol (PROVENTIL HFA;VENTOLIN HFA) 108 (90 Base) MCG/ACT inhaler Inhale 2 puffs into the lungs every 6 (six) hours as needed for wheezing or shortness of breath. 05/31/15  Yes Orbie Pyo, MD  Alpha-D-Galactosidase (BEANO PO) Take 1 tablet by mouth as needed (flatulence).    Yes [provider]  ALPRAZolam Duanne Moron) 0.5 MG tablet Take 0.5 mg by mouth 4 (four) times daily as needed for anxiety or sleep. And bedtime if neede   Yes [provider]  ANORO ELLIPTA 62.5-25 MCG/INH AEPB INHALE 1 PUFF INTO THE LUNGS DAILY TO REPLACE SPIRIVA Patient taking differently: Inhale 1 puff into the lungs daily.  04/02/18  Yes Lada, Satira Anis, MD  aspirin-acetaminophen-caffeine (EXCEDRIN MIGRAINE) [phone] MG tablet Take 1-2 tablets by mouth every 6 (six) hours as needed for headache.    Yes [provider]  benztropine (COGENTIN) 1 MG tablet Take 1 mg by mouth at bedtime.   Yes [provider]  carbidopa-levodopa (SINEMET IR) 25-100 MG tablet Take 0.5 tablets by mouth 3 (three) times daily.   Yes [provider]  divalproex (DEPAKOTE) 250 MG DR tablet Take 1 tablet (250 mg total) by mouth 2 (two) times daily. 11/19/16  Yes Rainey Pines, MD  escitalopram (LEXAPRO) 20 MG tablet Take 1 tablet (20 mg total) by mouth daily. 11/19/16  Yes Rainey Pines, MD  lurasidone (LATUDA) 40 MG TABS tablet Take 40 mg by mouth every evening.    Yes [provider]  meloxicam (MOBIC) 15 MG tablet Take 15 mg by mouth daily.   Yes [provider]  methocarbamol (ROBAXIN) 500 MG tablet Take 500 mg by mouth 2 (two) times daily as needed for muscle spasms.    Yes [provider]  Multiple Vitamin (MULTIVITAMIN WITH MINERALS) TABS tablet Take 1 tablet by mouth daily.   Yes [provider]  rOPINIRole (REQUIP) 4 MG tablet Take 4 mg by mouth at bedtime.  09/25/18  Yes [provider]  atorvastatin (LIPITOR) 40 MG tablet Take 1 tablet (40 mg total) by mouth at bedtime. Patient not taking: Reported on 12/03/2018 10/14/18   Fredderick Severance, NP    Allergies Chantix [varenicline] and Codeine  Family History  Problem Relation Age of Onset   Hernia Mother    Heart disease Mother    OCD Mother    Diabetes Mother    Parkinson's disease Father    Bipolar disorder Sister    Schizophrenia Sister    ADD / ADHD Sister    Alcohol abuse Brother    Bipolar disorder Sister    Paranoid behavior Sister    ADD / ADHD Sister    ADD / ADHD Son    Dementia Maternal Grandmother    Emphysema Maternal Grandfather    ADD / ADHD Son    ADD / ADHD Son  Depression Son     Social History Social History   Tobacco Use   Smoking status: Current Every Day Smoker    Packs/day: 1.00    Years: 36.00    Pack years: 36.00    Types: Cigarettes   Smokeless tobacco: Never Used  Substance Use Topics   Alcohol use: No    Alcohol/week: 0.0 standard drinks   Drug use: No    Review of Systems  Constitutional: No fever/chills.  Positive for malaise. Eyes: No visual changes. ENT: No sore throat. Cardiovascular: Denies chest pain. Respiratory: Denies shortness of breath. Gastrointestinal: No abdominal pain.  No nausea, no vomiting.  No diarrhea.  No constipation. Genitourinary: Negative for dysuria. Musculoskeletal: Negative for back pain. Skin: Negative for rash. Neurological: Negative for headaches, focal weakness or numbness.  Positive for depression and anxiety.  ____________________________________________   PHYSICAL EXAM:  VITAL SIGNS: ED Triage Vitals  Enc Vitals Group     BP 12/03/18 1500 (!) 135/109     Pulse Rate 12/03/18 1500 89     Resp --      Temp 12/03/18 1500 98.4 F (36.9 C)     Temp Source 12/03/18 1500 Oral      SpO2 12/03/18 1500 95 %     Weight 12/03/18 1501 220 lb (99.8 kg)     Height 12/03/18 1501 5\' 1"  (1.549 m)     Head Circumference --      Peak Flow --      Pain Score 12/03/18 1515 10     Pain Loc --      Pain Edu? --      Excl. in [place]? --     Constitutional: Alert and oriented. Eyes: Conjunctivae are normal. Head: Atraumatic. Nose: No congestion/rhinnorhea. Mouth/Throat: Mucous membranes are moist. Neck: Normal ROM Cardiovascular: Normal rate, regular rhythm. Grossly normal heart sounds. Respiratory: Normal respiratory effort.  No retractions. Lungs CTAB. Gastrointestinal: Soft and nontender. No distention. Genitourinary: deferred Musculoskeletal: No lower extremity tenderness nor edema. Neurologic:  Normal speech and language. No gross focal neurologic deficits are appreciated. Skin:  Skin is warm, dry and intact. No rash noted. Psychiatric: Anxious appearing and tearful, depressed mood. Speech and behavior are normal.  ____________________________________________   LABS (all labs ordered are listed, but only abnormal results are displayed)  Labs Reviewed  URINALYSIS, COMPLETE (UACMP) WITH MICROSCOPIC - Abnormal; Notable for the following components:      Result Value   Color, Urine YELLOW (*)    APPearance CLEAR (*)    All other components within normal limits  CBC WITH DIFFERENTIAL/PLATELET - Abnormal; Notable for the following components:   Eosinophils Absolute 0.7 (*)    All other components within normal limits  COMPREHENSIVE METABOLIC PANEL - Abnormal; Notable for the following components:   Sodium 130 (*)    Chloride 96 (*)    Calcium 8.8 (*)    All other components within normal limits  URINE DRUG SCREEN, QUALITATIVE ([facility] ONLY) - Abnormal; Notable for the following components:   Cannabinoid 50 Ng, Ur  POSITIVE (*)    Benzodiazepine, Ur Scrn POSITIVE (*)    All other components within normal limits  ACETAMINOPHEN LEVEL - Abnormal; Notable for the following  components:   Acetaminophen (Tylenol), Serum <10 (*)    All other components within normal limits  ETHANOL  SALICYLATE LEVEL   ____________________________________________  EKG  ED ECG REPORT I, Blake Divine, the attending physician, personally viewed and interpreted this ECG.   Date: 12/03/2018  EKG Time: 10:55  Rate: 56  Rhythm: normal sinus rhythm  Axis: Normal  Intervals:none  ST&T Change: T wave inversion in V2, similar to prior  _  PROCEDURES  Procedure(s) performed (including Critical Care):  Procedures   ____________________________________________   INITIAL IMPRESSION / ASSESSMENT AND PLAN / ED COURSE       58 year old female presents to the ED with increasing anxiety and depression over the past week, but denies any suicidal or homicidal ideation.  She states "I feel bad but I do not know what is going on".  Will screen labs, EKG, UA and if these are unremarkable patient to be medically cleared for psychiatric evaluation.  Will maintain voluntary status for now as she does not appear to be a threat to herself or others.  Labs unremarkable, patient medically cleared.  Will have patient evaluated by tele-psychiatry.  Patient evaluated by tele-psychiatry and does not meet criteria for admission.  She is appropriate for outpatient psychiatric follow-up and was provided with resources.  She continues to deny SI or HI, counseled patient to return to the ED for new or worsening symptoms, patient agrees with plan.      ____________________________________________   FINAL CLINICAL IMPRESSION(S) / ED DIAGNOSES  Final diagnoses:  Depressed mood  Anxiety     ED Discharge Orders    None       Note:  This document was prepared using Dragon voice recognition software and may include unintentional dictation errors.   Blake Divine, MD 12/03/18 2337

## 2018-12-03 NOTE — Patient Instructions (Signed)
Here are some resources to help you if you feel you are in a mental health crisis:  [place] - Call [phone]  for help - Website with more resources: [email or web address].pt  [employer] - Call [phone] for help. - Mobile Crisis Program available 24 hours a day, 365 days a year. - Available for anyone of any age in [place] counties.  [facility] - Address: [address], [place] - Telephone: [phone]  - Hours of Operation: Sunday - Saturday - 8:00 a.m. - 8:00 p.m. - Medicaid, Medicare (Government Issued Only), BCBS, and [place], Psychiatrists on-site to provide medication management, [place], and Peer Support Care.  National Mobile Crisis: [phone] [phone] - [place] available 24 hours a day, 365 days a year. - Available for anyone of any age in [place]

## 2018-12-04 ENCOUNTER — Telehealth: Payer: Self-pay

## 2018-12-04 ENCOUNTER — Other Ambulatory Visit: Payer: Self-pay | Admitting: Family Medicine

## 2018-12-04 MED ORDER — TIZANIDINE HCL 2 MG PO CAPS: 2.0000 mg | ORAL_CAPSULE | Freq: Three times a day (TID) | ORAL | 1 refills | Status: DC | PRN

## 2018-12-04 NOTE — Telephone Encounter (Signed)
Copied from [place] [phone]. Topic: General - Other >> Dec 04, 2018 10:31 AM Burchel, Abbi R wrote: Reason for CRM: Pt states she would like to get rx(s) discussed during OV yesterday w/ [name] Tapia (muscle relaxer?).  Please advise.  Pt: [phone]

## 2018-12-04 NOTE — Telephone Encounter (Signed)
Sent in muscle relaxer refill

## 2018-12-16 ENCOUNTER — Encounter: Payer: Self-pay | Admitting: *Deleted

## 2018-12-18 ENCOUNTER — Telehealth: Payer: Self-pay

## 2018-12-18 NOTE — Telephone Encounter (Signed)
Copied from [place] [phone]. Topic: Referral - Request for Referral >> Dec 18, 2018  2:24 PM Rainey Pines A wrote: Has patient seen PCP for this complaint? Yes *If NO, is insurance requiring patient see PCP for this issue before PCP can refer them? Referral for which specialty:Psychology Preferred provider/office: Dr. Ernest Mallick Reason for referral:Depression

## 2018-12-19 ENCOUNTER — Other Ambulatory Visit: Payer: Self-pay | Admitting: Family Medicine

## 2018-12-19 DIAGNOSIS — F603 Borderline personality disorder: Secondary | ICD-10-CM

## 2018-12-19 DIAGNOSIS — F411 Generalized anxiety disorder: Secondary | ICD-10-CM

## 2018-12-19 DIAGNOSIS — F988 Other specified behavioral and emotional disorders with onset usually occurring in childhood and adolescence: Secondary | ICD-10-CM

## 2018-12-19 DIAGNOSIS — F329 Major depressive disorder, single episode, unspecified: Secondary | ICD-10-CM

## 2018-12-19 DIAGNOSIS — F319 Bipolar disorder, unspecified: Secondary | ICD-10-CM

## 2018-12-19 DIAGNOSIS — F1910 Other psychoactive substance abuse, uncomplicated: Secondary | ICD-10-CM

## 2018-12-19 NOTE — Progress Notes (Signed)
Referrals put in for CCM and psych/psychology    ICD-10-CM   1. Bipolar disorder with depression [facility])  F31.9 Ambulatory referral to Chronic Care Management Services    Ambulatory referral to Psychiatry    Ambulatory referral to Psychology  2. Attention deficit disorder, unspecified hyperactivity presence  F98.8 Ambulatory referral to Chronic Care Management Services    Ambulatory referral to Psychiatry    Ambulatory referral to Psychology  3. Anxiety, generalized  F41.1 Ambulatory referral to Chronic Care Management Services    Ambulatory referral to Psychiatry    Ambulatory referral to Psychology  4. Polysubstance abuse ([place])  F19.10 Ambulatory referral to Chronic Care Management Services    Ambulatory referral to Psychiatry    Ambulatory referral to Psychology  5. Emotional instability ([place])  F60.3 Ambulatory referral to Chronic Care Management Services    Ambulatory referral to Psychiatry    Ambulatory referral to Psychology  6. Psychoactive substance abuse ([place])  F19.10 Ambulatory referral to Chronic Care Management Services    Ambulatory referral to Psychiatry    Ambulatory referral to Psychology  7. Major depressive disorder with current active episode, unspecified depression episode severity, unspecified whether recurrent  F32.9 Ambulatory referral to Chronic Care Management Services    Ambulatory referral to Psychiatry    Ambulatory referral to Psychology

## 2018-12-22 ENCOUNTER — Telehealth: Payer: Self-pay | Admitting: Family Medicine

## 2018-12-22 ENCOUNTER — Telehealth: Payer: Self-pay

## 2018-12-22 NOTE — Telephone Encounter (Signed)
I called this patient to find out who she wanted to be referred to since I was unable to find Ernest Mallick and she told me that it is [employer] [facility] in [place].   The referral will be processed and faxed as requested by patient.  Mrs. Nicole [name] Austin, [place] [address] [phone]

## 2018-12-22 NOTE — Chronic Care Management (AMB) (Signed)
  Chronic Care Management   Note  12/22/2018 Name: Yakisha Detorres MRN: [id number] DOB: [birth year]  Sachi Maw Selmer is a 58 y.o. year old female who is a primary care patient of Lada, Satira Anis, MD. Cortina Halim Cunning is currently enrolled in care management services. An additional referral for 12/25/2018 was placed.   Follow up plan: Telephone appointment with CCM team member scheduled for: 12/25/2018  Glenna Durand LPN Nurse Health Advisor . [place]  [email or web address] ??[phone]

## 2018-12-23 ENCOUNTER — Ambulatory Visit: Payer: Self-pay | Admitting: *Deleted

## 2018-12-23 ENCOUNTER — Telehealth: Payer: Self-pay

## 2018-12-23 ENCOUNTER — Encounter: Payer: Self-pay | Admitting: *Deleted

## 2018-12-23 NOTE — Telephone Encounter (Signed)
Copied from [place] [id number]. Topic: General - Other >> Dec 23, 2018  2:07 PM Pauline Good wrote: Reason for CRM: pt's appt is sched for 11.12.20 at 1pm   appt date and time has been documented in the referral and closed out.

## 2018-12-23 NOTE — Chronic Care Management (AMB) (Signed)
  Chronic Care Management    Clinical Social Work General Note  12/23/2018 Name: Nicole Austin MRN: [id number] DOB: Feb 11, 1961  Nicole Austin is a 58 y.o. year old female who is a primary care patient of Lada, Satira Anis, MD. The CCM was consulted to assist the patient with Mental Health Counseling and Resources. Patient had a recent phone visit and ER visit with worsening mood/anxiety/stress.  Per patient, she has an appointment set up with [facility] in [place]. Due to transportation issues, the therapist, Ernest Mallick will be coming to her home on 01/05/19 .  Patient also has an appointment with her psychiatrist on 01/19/19 with Dr.Hansen  Su through [facility] Patient verbalized having no additional community resource needs at this time.  Review of patient status, including review of consultants reports, relevant laboratory and other test results, and collaboration with appropriate care team members and the patient's provider was performed as part of comprehensive patient evaluation and provision of chronic care management services.    Advanced Directives Status: <no information> See Care Plan for related entries.   Outpatient Encounter Medications as of 12/23/2018  Medication Sig  . acetaminophen (TYLENOL) 500 MG tablet Take 500-1,000 mg by mouth every 6 (six) hours as needed for mild pain or fever.   Marland Kitchen albuterol (PROVENTIL HFA;VENTOLIN HFA) 108 (90 Base) MCG/ACT inhaler Inhale 2 puffs into the lungs every 6 (six) hours as needed for wheezing or shortness of breath.  . Alpha-D-Galactosidase (BEANO PO) Take 1 tablet by mouth as needed (flatulence).   . ALPRAZolam (XANAX) 0.5 MG tablet Take 0.5 mg by mouth 4 (four) times daily as needed for anxiety or sleep. And bedtime if neede  . ANORO ELLIPTA 62.5-25 MCG/INH AEPB INHALE 1 PUFF INTO THE LUNGS DAILY TO REPLACE SPIRIVA (Patient taking differently: Inhale 1 puff into the lungs daily. )  .  aspirin-acetaminophen-caffeine (EXCEDRIN MIGRAINE) 250-250-65 MG tablet Take 1-2 tablets by mouth every 6 (six) hours as needed for headache.   Marland Kitchen atorvastatin (LIPITOR) 40 MG tablet Take 1 tablet (40 mg total) by mouth at bedtime. (Patient not taking: Reported on 12/03/2018)  . benztropine (COGENTIN) 1 MG tablet Take 1 mg by mouth at bedtime.  . carbidopa-levodopa (SINEMET IR) 25-100 MG tablet Take 0.5 tablets by mouth 3 (three) times daily.  . divalproex (DEPAKOTE) 250 MG DR tablet Take 1 tablet (250 mg total) by mouth 2 (two) times daily.  Marland Kitchen escitalopram (LEXAPRO) 20 MG tablet Take 1 tablet (20 mg total) by mouth daily.  Marland Kitchen lurasidone (LATUDA) 40 MG TABS tablet Take 40 mg by mouth every evening.   . meloxicam (MOBIC) 15 MG tablet Take 15 mg by mouth daily.  . methocarbamol (ROBAXIN) 500 MG tablet Take 500 mg by mouth 2 (two) times daily as needed for muscle spasms.   . Multiple Vitamin (MULTIVITAMIN WITH MINERALS) TABS tablet Take 1 tablet by mouth daily.  Marland Kitchen rOPINIRole (REQUIP) 4 MG tablet Take 4 mg by mouth at bedtime.   . tizanidine (ZANAFLEX) 2 MG capsule Take 1-2 capsules (2-4 mg total) by mouth 3 (three) times daily as needed for muscle spasms.   No facility-administered encounter medications on file as of 12/23/2018.     Goals Addressed   None      Follow Up Plan: Client will contact this social worker with any additional community resource needs       Elliot Gurney, [place] Care Management [phone]

## 2018-12-25 ENCOUNTER — Telehealth: Payer: Medicare Other

## 2018-12-29 ENCOUNTER — Other Ambulatory Visit
Admission: RE | Admit: 2018-12-29 | Discharge: 2018-12-29 | Disposition: A | Discharge status: Home / Self Care | Payer: Medicare Other | Source: Ambulatory Visit | Attending: Ophthalmology | Admitting: Ophthalmology

## 2018-12-29 ENCOUNTER — Other Ambulatory Visit: Payer: Self-pay

## 2018-12-29 DIAGNOSIS — Z01812 Encounter for preprocedural laboratory examination: Secondary | ICD-10-CM | POA: Diagnosis present

## 2018-12-29 DIAGNOSIS — Z20828 Contact with and (suspected) exposure to other viral communicable diseases: Secondary | ICD-10-CM | POA: Insufficient documentation

## 2018-12-29 LAB — SARS CORONAVIRUS 2 (TAT 6-24 HRS): SARS Coronavirus 2: NEGATIVE

## 2019-01-01 ENCOUNTER — Ambulatory Visit
Admission: RE | Admit: 2019-01-01 | Discharge: 2019-01-01 | Disposition: A | Discharge status: Home / Self Care | Payer: Medicare Other | Attending: Ophthalmology | Admitting: Ophthalmology

## 2019-01-01 ENCOUNTER — Ambulatory Visit: Payer: Medicare Other | Admitting: Certified Registered"

## 2019-01-01 ENCOUNTER — Encounter: Payer: Self-pay | Admitting: *Deleted

## 2019-01-01 ENCOUNTER — Other Ambulatory Visit: Payer: Self-pay

## 2019-01-01 ENCOUNTER — Encounter
Admission: RE | Disposition: A | Discharge status: Home / Self Care | Payer: Self-pay | Source: Home / Self Care | Attending: Ophthalmology

## 2019-01-01 DIAGNOSIS — Z6841 Body Mass Index (BMI) 40.0 and over, adult: Secondary | ICD-10-CM | POA: Diagnosis not present

## 2019-01-01 DIAGNOSIS — Z888 Allergy status to other drugs, medicaments and biological substances status: Secondary | ICD-10-CM | POA: Diagnosis not present

## 2019-01-01 DIAGNOSIS — H2511 Age-related nuclear cataract, right eye: Secondary | ICD-10-CM | POA: Insufficient documentation

## 2019-01-01 DIAGNOSIS — J449 Chronic obstructive pulmonary disease, unspecified: Secondary | ICD-10-CM | POA: Diagnosis not present

## 2019-01-01 DIAGNOSIS — Z79899 Other long term (current) drug therapy: Secondary | ICD-10-CM | POA: Diagnosis not present

## 2019-01-01 DIAGNOSIS — G20 Parkinson's disease: Secondary | ICD-10-CM | POA: Diagnosis not present

## 2019-01-01 DIAGNOSIS — Z96652 Presence of left artificial knee joint: Secondary | ICD-10-CM | POA: Insufficient documentation

## 2019-01-01 DIAGNOSIS — F419 Anxiety disorder, unspecified: Secondary | ICD-10-CM | POA: Diagnosis not present

## 2019-01-01 DIAGNOSIS — Z791 Long term (current) use of non-steroidal anti-inflammatories (NSAID): Secondary | ICD-10-CM | POA: Diagnosis not present

## 2019-01-01 DIAGNOSIS — F988 Other specified behavioral and emotional disorders with onset usually occurring in childhood and adolescence: Secondary | ICD-10-CM | POA: Insufficient documentation

## 2019-01-01 DIAGNOSIS — G2581 Restless legs syndrome: Secondary | ICD-10-CM | POA: Diagnosis not present

## 2019-01-01 DIAGNOSIS — F17200 Nicotine dependence, unspecified, uncomplicated: Secondary | ICD-10-CM | POA: Insufficient documentation

## 2019-01-01 DIAGNOSIS — I252 Old myocardial infarction: Secondary | ICD-10-CM | POA: Insufficient documentation

## 2019-01-01 DIAGNOSIS — F319 Bipolar disorder, unspecified: Secondary | ICD-10-CM | POA: Diagnosis not present

## 2019-01-01 DIAGNOSIS — G4730 Sleep apnea, unspecified: Secondary | ICD-10-CM | POA: Insufficient documentation

## 2019-01-01 DIAGNOSIS — M1990 Unspecified osteoarthritis, unspecified site: Secondary | ICD-10-CM | POA: Insufficient documentation

## 2019-01-01 HISTORY — DX: Other chronic pain: G89.29

## 2019-01-01 HISTORY — PX: CATARACT EXTRACTION W/PHACO: SHX586

## 2019-01-01 HISTORY — DX: Tremor, unspecified: R25.1

## 2019-01-01 HISTORY — DX: Dyspnea, unspecified: R06.00

## 2019-01-01 HISTORY — DX: Restless legs syndrome: G25.81

## 2019-01-01 SURGERY — PHACOEMULSIFICATION, CATARACT, WITH IOL INSERTION
Anesthesia: Monitor Anesthesia Care | Site: Eye | Laterality: Right

## 2019-01-01 MED ORDER — TETRACAINE HCL 0.5 % OP SOLN
1.0000 [drp] | Freq: Once | OPHTHALMIC | Status: AC
  Administered 2019-01-01: 08:00:00 1 [drp] via OPHTHALMIC

## 2019-01-01 MED ORDER — CARBACHOL 0.01 % IO SOLN
INTRAOCULAR | Status: DC | PRN
  Administered 2019-01-01: 0.5 mL via INTRAOCULAR

## 2019-01-01 MED ORDER — NA HYALUR & NA CHOND-NA HYALUR 0.55-0.5 ML IO KIT
PACK | INTRAOCULAR | Status: DC | PRN
  Administered 2019-01-01: 1 via OPHTHALMIC

## 2019-01-01 MED ORDER — ARMC OPHTHALMIC DILATING DROPS
OPHTHALMIC | Status: AC
  Filled 2019-01-01: qty 0.5

## 2019-01-01 MED ORDER — MIDAZOLAM HCL 2 MG/2ML IJ SOLN
INTRAMUSCULAR | Status: AC
  Filled 2019-01-01: qty 2

## 2019-01-01 MED ORDER — ARMC OPHTHALMIC DILATING DROPS
1.0000 "application " | OPHTHALMIC | Status: AC
  Administered 2019-01-01 (×3): 1 via OPHTHALMIC

## 2019-01-01 MED ORDER — GLYCOPYRROLATE PF 0.2 MG/ML IJ SOSY
PREFILLED_SYRINGE | INTRAMUSCULAR | Status: DC | PRN
  Administered 2019-01-01 (×2): .2 mg via INTRAVENOUS

## 2019-01-01 MED ORDER — SODIUM CHLORIDE 0.9 % IV SOLN
INTRAVENOUS | Status: DC
  Administered 2019-01-01: 08:00:00 via INTRAVENOUS

## 2019-01-01 MED ORDER — POVIDONE-IODINE 5 % OP SOLN
OPHTHALMIC | Status: DC | PRN
  Administered 2019-01-01: 1 via OPHTHALMIC

## 2019-01-01 MED ORDER — TETRACAINE HCL 0.5 % OP SOLN
OPHTHALMIC | Status: AC
  Administered 2019-01-01: 08:00:00
  Filled 2019-01-01: qty 4

## 2019-01-01 MED ORDER — DEXMEDETOMIDINE HCL 200 MCG/2ML IV SOLN
INTRAVENOUS | Status: DC | PRN
  Administered 2019-01-01 (×2): 8 ug via INTRAVENOUS
  Administered 2019-01-01: 4 ug via INTRAVENOUS

## 2019-01-01 MED ORDER — EPINEPHRINE PF 1 MG/ML IJ SOLN
INTRAOCULAR | Status: DC | PRN
  Administered 2019-01-01: 09:00:00 via OPHTHALMIC

## 2019-01-01 MED ORDER — NA CHONDROIT SULF-NA HYALURON 40-17 MG/ML IO SOLN
INTRAOCULAR | Status: DC | PRN
  Administered 2019-01-01: 1 mL via INTRAOCULAR

## 2019-01-01 MED ORDER — LIDOCAINE HCL (PF) 4 % IJ SOLN
INTRAOCULAR | Status: DC | PRN
  Administered 2019-01-01: 4 mL via OPHTHALMIC

## 2019-01-01 MED ORDER — TRYPAN BLUE 0.06 % OP SOLN
OPHTHALMIC | Status: DC | PRN
  Administered 2019-01-01: 0.5 mL via INTRAOCULAR

## 2019-01-01 MED ORDER — MOXIFLOXACIN HCL 0.5 % OP SOLN
OPHTHALMIC | Status: AC
  Filled 2019-01-01: qty 3

## 2019-01-01 MED ORDER — MOXIFLOXACIN HCL 0.5 % OP SOLN: 1.0000 [drp] | Freq: Once | OPHTHALMIC | Status: DC

## 2019-01-01 MED ORDER — NEOMYCIN-POLYMYXIN-DEXAMETH 3.5-10000-0.1 OP OINT
TOPICAL_OINTMENT | OPHTHALMIC | Status: AC
  Filled 2019-01-01: qty 3.5

## 2019-01-01 MED ORDER — MIDAZOLAM HCL 2 MG/2ML IJ SOLN
INTRAMUSCULAR | Status: DC | PRN
  Administered 2019-01-01 (×2): 2 mg via INTRAVENOUS

## 2019-01-01 MED ORDER — MOXIFLOXACIN HCL 0.5 % OP SOLN
OPHTHALMIC | Status: DC | PRN
  Administered 2019-01-01: 0.2 mL via OPHTHALMIC

## 2019-01-01 SURGICAL SUPPLY — 18 items
BNDG EYE OVAL (GAUZE/BANDAGES/DRESSINGS) ×4 IMPLANT
DISSECTOR HYDRO NUCLEUS 50X22 (MISCELLANEOUS) ×12 IMPLANT
DRSG TEGADERM 2-3/8X2-3/4 SM (GAUZE/BANDAGES/DRESSINGS) ×3 IMPLANT
GLOVE BIOGEL M 6.5 STRL (GLOVE) ×3 IMPLANT
GOWN STRL REUS W/ TWL LRG LVL3 (GOWN DISPOSABLE) ×1 IMPLANT
GOWN STRL REUS W/ TWL XL LVL3 (GOWN DISPOSABLE) ×1 IMPLANT
GOWN STRL REUS W/TWL LRG LVL3 (GOWN DISPOSABLE) ×2
GOWN STRL REUS W/TWL XL LVL3 (GOWN DISPOSABLE) ×2
KNIFE 45D UP 2.3 (MISCELLANEOUS) ×3 IMPLANT
LABEL CATARACT MEDS ST (LABEL) ×3 IMPLANT
LENS IOL TECNIS ITEC 17.5 (Intraocular Lens) ×2 IMPLANT
PACK CATARACT (MISCELLANEOUS) ×3 IMPLANT
PACK CATARACT KING (MISCELLANEOUS) ×3 IMPLANT
PACK EYE AFTER SURG (MISCELLANEOUS) ×3 IMPLANT
SOL BSS BAG (MISCELLANEOUS) ×3
SOLUTION BSS BAG (MISCELLANEOUS) ×1 IMPLANT
WATER STERILE IRR 250ML POUR (IV SOLUTION) ×3 IMPLANT
WIPE NON LINTING 3.25X3.25 (MISCELLANEOUS) ×3 IMPLANT

## 2019-01-01 NOTE — H&P (Signed)
   I have reviewed the patient's H&P and agree with its findings. There have been no interval changes.  Wilberto Console MD Ophthalmology 

## 2019-01-01 NOTE — Transfer of Care (Signed)
Immediate Anesthesia Transfer of Care Note  Patient: Nicole Austin  Procedure(s) Performed: CATARACT EXTRACTION PHACO AND INTRAOCULAR LENS PLACEMENT (IOC) right vision blue (Right Eye)  Patient Location: PACU  Anesthesia Type:MAC  Level of Consciousness: awake, alert  and oriented  Airway & Oxygen Therapy: Patient Spontanous Breathing  Post-op Assessment: Report given to RN and Post -op Vital signs reviewed and stable  Post vital signs: Reviewed and stable  Last Vitals:  Vitals Value Taken Time  BP    Temp    Pulse    Resp    SpO2      Last Pain:  Vitals:   01/01/19 0702  TempSrc: Tympanic  PainSc: 0-No pain         Complications: No apparent anesthesia complications

## 2019-01-01 NOTE — Anesthesia Preprocedure Evaluation (Addendum)
Anesthesia Evaluation  Patient identified by MRN, date of birth, ID band Patient awake    Reviewed: Allergy & Precautions, H&P , NPO status , reviewed documented beta blocker date and time   Airway Mallampati: II  TM Distance: >3 FB Neck ROM: full    Dental  (+) Upper Dentures, Lower Dentures   Pulmonary shortness of breath, asthma , sleep apnea , COPD, Current Smoker and Patient abstained from smoking.,    Pulmonary exam normal        Cardiovascular + Past MI  Normal cardiovascular exam  02/2015 ECHO Study Conclusions  - Left ventricle: The cavity size was normal. Systolic function was   normal. The estimated ejection fraction was in the range of 55%   to 60%.   Neuro/Psych PSYCHIATRIC DISORDERS Anxiety Depression Bipolar Disorder  Neuromuscular disease CVA    GI/Hepatic GERD  Controlled,  Endo/Other  Morbid obesity  Renal/GU      Musculoskeletal  (+) Arthritis , Fibromyalgia -  Abdominal   Peds  Hematology   Anesthesia Other Findings Past Medical History: No date: ADHD (attention deficit hyperactivity disorder) 06/30/2015: Apnea, sleep No date: Arthritis No date: Asthma 06/30/2015: Asthma with acute exacerbation No date: Bipolar 1 disorder (HCC) No date: Chronic pain 11/2018: COPD (chronic obstructive pulmonary disease) (HCC)     Comment:  **LUNG SOUNDS WHEEZING AND WITH RALES** 11/18/2013: COPD, moderate ([place])     Comment:  Overview:  Last Assessment & Plan:  Refer to               pulmonologist Overview:  Overview:  Last Assessment &               Plan:  Pneumonia vaccine offered and given today; she did              not tolerate LABA; will start her on Spiriva; stop               combivent neb; use only SABA neb OR inhaler, not both;               smoking cessation urged, and I am here to help if she               wants assistance quitting; f/u in 4 weeks Last Assessment              & Plan:  Pneumonia  vaccin No date: Depression No date: Dyspnea     Comment:  with exertion 08/30/2015: GERD (gastroesophageal reflux disease) 08/16/2014: Gout 06/30/2015: History of acute myocardial infarction     Comment:  Overview:  [facility]  07/17/2013: HPV (human papilloma virus) infection No date: Hyperlipidemia 08/30/2015: Low HDL (under 40) 2018: MI (myocardial infarction) ([place]) No date: Parkinson disease ([place]) No date: Pre-diabetes 05/20/2016: Prediabetes No date: PTSD (post-traumatic stress disorder)     Comment:  history of panic attacks No date: PTSD (post-traumatic stress disorder) No date: Restless leg syndrome No date: Rhabdomyolysis 06/30/2015: Sleep apnea 11/18/2013: Stage 3 severe COPD by GOLD classification ([place]) 2018: Stroke ([place]) No date: Tremors of nervous system     Comment:  hands 06/30/2015: Uncomplicated asthma 60/73/7106: Varicella  Past Surgical History: No date: BACK SURGERY 08/07/2017: COLONOSCOPY WITH PROPOFOL; N/A     Comment:  Procedure: COLONOSCOPY WITH PROPOFOL;  Surgeon:               Virgel Manifold, MD;  Location: [facility] ENDOSCOPY;  Service: Endoscopy;  Laterality: N/A; 2016: JOINT REPLACEMENT; Left     Comment:  tkr No date: REPLACEMENT TOTAL KNEE; Left  BMI    Body Mass Index: 41.24 kg/m      Reproductive/Obstetrics                            Anesthesia Physical Anesthesia Plan  ASA: III  Anesthesia Plan: MAC   Post-op Pain Management:    Induction: Intravenous  PONV Risk Score and Plan: 1 and TIVA  Airway Management Planned: Nasal Cannula and Natural Airway  Additional Equipment:   Intra-op Plan:   Post-operative Plan:   Informed Consent: I have reviewed the patients History and Physical, chart, labs and discussed the procedure including the risks, benefits and alternatives for the proposed anesthesia with the patient or authorized representative who has indicated his/her understanding and acceptance.      Dental Advisory Given  Plan Discussed with: CRNA  Anesthesia Plan Comments:         Anesthesia Quick Evaluation

## 2019-01-01 NOTE — Anesthesia Post-op Follow-up Note (Signed)
Anesthesia QCDR form completed.        

## 2019-01-01 NOTE — Op Note (Signed)
  PREOPERATIVE DIAGNOSIS:  Nuclear sclerotic cataract of the RIGHT eye.   POSTOPERATIVE DIAGNOSIS:  Nuclear sclerotic cataract of the RIGHT eye.   OPERATIVE PROCEDURE: Cataract surgery OD   SURGEON:  Marchia Meiers, MD.   ANESTHESIA:  Anesthesiologist: Alphonsus Sias, MD CRNA: Staci Acosta, CRNA  1.      Managed anesthesia care. 2.     0.3ml of Shugarcaine was instilled following the paracentesis   COMPLICATIONS:  None.   TECHNIQUE:   Divide and conquer   DESCRIPTION OF PROCEDURE:  The patient was examined and consented in the preoperative holding area where the aforementioned topical anesthesia was applied to the RIGHT eye and then brought back to the Operating Room where the RIGHT eye was prepped and draped in the usual sterile ophthalmic fashion and a lid speculum was placed. A paracentesis was created with the side port blade, the anterior chamber was washed out with trypan blue to stain the anterior capsule, and the anterior chamber was filled with viscoelastic. A near clear corneal incision was performed with the steel keratome. A continuous curvilinear capsulorrhexis was performed with a cystotome followed by the capsulorrhexis forceps. Hydrodissection and hydrodelineation were carried out with BSS on a blunt cannula. The lens was removed in a divide and conquer  technique and the remaining cortical material was removed with the irrigation-aspiration handpiece. The capsular bag was inflated with viscoelastic and the lens was placed in the capsular bag without complication. The remaining viscoelastic was removed from the eye with the irrigation-aspiration handpiece. The wounds were hydrated. The anterior chamber was flushed and the eye was inflated to physiologic pressure. 0.54ml Vigamox was placed in the anterior chamber. The wounds were found to be water tight. The eye was dressed with Vigamox. The patient was given protective glasses to wear throughout the day and a shield with which to  sleep tonight. The patient was also given drops with which to begin a drop regimen today and will follow-up with me in one day. Implant Name Type Inv. Item Serial No. Manufacturer Lot No. LRB No. Used Action  LENS IOL DIOP 17.5 - [id number] Intraocular Lens LENS IOL DIOP 17.5 [id number] [place]  Right 1 Implanted    Procedure(s) with comments: CATARACT EXTRACTION PHACO AND INTRAOCULAR LENS PLACEMENT (IOC) right vision blue (Right) - [place] 00:39.4 CDE 5.49 Fluid Pack lot # [id number]  Electronically signed: Marchia Meiers 01/01/2019 12:04 PM

## 2019-01-01 NOTE — Discharge Instructions (Signed)
Eye Surgery Discharge Instructions    Expect mild scratchy sensation or mild soreness. DO NOT RUB YOUR EYE!  The day of surgery:  Minimal physical activity, but bed rest is not required  No reading, computer work, or close hand work  No bending, lifting, or straining.  May watch TV  For 24 hours:  No driving, legal decisions, or alcoholic beverages  Safety precautions  Eat anything you prefer: It is better to start with liquids, then soup then solid foods.  _____ Eye patch should be worn until postoperative exam tomorrow.  ____ Solar shield eyeglasses should be worn for comfort in the sunlight/patch while sleeping  Resume all regular medications including aspirin or Coumadin if these were discontinued prior to surgery. You may shower, bathe, shave, or wash your hair. Tylenol may be taken for mild discomfort.  Call your doctor if you experience significant pain, nausea, or vomiting, fever > 101 or other signs of infection. [phone] or [phone] Specific instructions:  Follow-up Information    Marchia Meiers, MD Follow up on 01/02/2019.   Specialty: Ophthalmology Why: at [facility] . Contact information: [place] [address] [phone]

## 2019-01-02 ENCOUNTER — Encounter: Payer: Self-pay | Admitting: Ophthalmology

## 2019-01-07 ENCOUNTER — Telehealth: Payer: Self-pay | Admitting: Family Medicine

## 2019-01-07 NOTE — Telephone Encounter (Signed)
Medication Refill - Medication: tizanidine (ZANAFLEX) 2 MG capsule  Has the patient contacted their pharmacy? no (Agent: If no, request that the patient contact the pharmacy for the refill.) (Agent: If yes, when and what did the pharmacy advise?)  Preferred Pharmacy (with phone number or street name):  [facility], [place] [phone] (Phone) [phone] (Fax)   Agent: Please be advised that RX refills may take up to 3 business days. We ask that you follow-up with your pharmacy.

## 2019-01-07 NOTE — Telephone Encounter (Signed)
Pt has refills, notified

## 2019-01-08 ENCOUNTER — Other Ambulatory Visit: Payer: Self-pay | Admitting: Family Medicine

## 2019-01-08 NOTE — Anesthesia Postprocedure Evaluation (Signed)
Anesthesia Post Note  Patient: Faylinn Schwenn Dietze  Procedure(s) Performed: CATARACT EXTRACTION PHACO AND INTRAOCULAR LENS PLACEMENT (IOC) right vision blue (Right Eye)  Patient location during evaluation: Phase II Anesthesia Type: MAC Level of consciousness: awake and alert Pain management: pain level controlled Vital Signs Assessment: post-procedure vital signs reviewed and stable Respiratory status: spontaneous breathing, nonlabored ventilation and respiratory function stable Cardiovascular status: blood pressure returned to baseline and stable Postop Assessment: no apparent nausea or vomiting Anesthetic complications: no     Last Vitals:  Vitals:   01/01/19 0702 01/01/19 0904  BP: 102/69 (!) 113/56  Pulse: 71 61  Resp: 16 16  Temp: (!) 35.9 C (!) 36.3 C  SpO2: 100% 98%    Last Pain:  Vitals:   01/02/19 0916  TempSrc:   PainSc: 0-No pain                 Alphonsus Sias

## 2019-01-14 ENCOUNTER — Other Ambulatory Visit: Payer: Self-pay | Admitting: Family Medicine

## 2019-01-14 ENCOUNTER — Encounter: Payer: Self-pay | Admitting: *Deleted

## 2019-01-26 ENCOUNTER — Other Ambulatory Visit
Admission: RE | Admit: 2019-01-26 | Discharge: 2019-01-26 | Disposition: A | Discharge status: Home / Self Care | Payer: 59 | Source: Ambulatory Visit | Attending: Ophthalmology | Admitting: Ophthalmology

## 2019-01-26 ENCOUNTER — Other Ambulatory Visit: Payer: Self-pay

## 2019-01-26 DIAGNOSIS — Z20828 Contact with and (suspected) exposure to other viral communicable diseases: Secondary | ICD-10-CM | POA: Diagnosis not present

## 2019-01-26 DIAGNOSIS — Z01812 Encounter for preprocedural laboratory examination: Secondary | ICD-10-CM | POA: Insufficient documentation

## 2019-01-26 LAB — SARS CORONAVIRUS 2 (TAT 6-24 HRS): SARS Coronavirus 2: NEGATIVE

## 2019-01-29 ENCOUNTER — Encounter
Admission: RE | Disposition: A | Discharge status: Home / Self Care | Payer: Self-pay | Source: Home / Self Care | Attending: Ophthalmology

## 2019-01-29 ENCOUNTER — Ambulatory Visit: Payer: Medicare Other | Admitting: Certified Registered"

## 2019-01-29 ENCOUNTER — Other Ambulatory Visit: Payer: Self-pay

## 2019-01-29 ENCOUNTER — Ambulatory Visit
Admission: RE | Admit: 2019-01-29 | Discharge: 2019-01-29 | Disposition: A | Discharge status: Home / Self Care | Payer: Medicare Other | Attending: Ophthalmology | Admitting: Ophthalmology

## 2019-01-29 DIAGNOSIS — M1990 Unspecified osteoarthritis, unspecified site: Secondary | ICD-10-CM | POA: Insufficient documentation

## 2019-01-29 DIAGNOSIS — Z96652 Presence of left artificial knee joint: Secondary | ICD-10-CM | POA: Insufficient documentation

## 2019-01-29 DIAGNOSIS — J449 Chronic obstructive pulmonary disease, unspecified: Secondary | ICD-10-CM | POA: Diagnosis not present

## 2019-01-29 DIAGNOSIS — F4310 Post-traumatic stress disorder, unspecified: Secondary | ICD-10-CM | POA: Insufficient documentation

## 2019-01-29 DIAGNOSIS — G20 Parkinson's disease: Secondary | ICD-10-CM | POA: Diagnosis not present

## 2019-01-29 DIAGNOSIS — G4730 Sleep apnea, unspecified: Secondary | ICD-10-CM | POA: Insufficient documentation

## 2019-01-29 DIAGNOSIS — Z79899 Other long term (current) drug therapy: Secondary | ICD-10-CM | POA: Diagnosis not present

## 2019-01-29 DIAGNOSIS — E785 Hyperlipidemia, unspecified: Secondary | ICD-10-CM | POA: Diagnosis not present

## 2019-01-29 DIAGNOSIS — F17200 Nicotine dependence, unspecified, uncomplicated: Secondary | ICD-10-CM | POA: Diagnosis not present

## 2019-01-29 DIAGNOSIS — F319 Bipolar disorder, unspecified: Secondary | ICD-10-CM | POA: Diagnosis not present

## 2019-01-29 DIAGNOSIS — H2512 Age-related nuclear cataract, left eye: Secondary | ICD-10-CM | POA: Insufficient documentation

## 2019-01-29 DIAGNOSIS — M797 Fibromyalgia: Secondary | ICD-10-CM | POA: Insufficient documentation

## 2019-01-29 DIAGNOSIS — F419 Anxiety disorder, unspecified: Secondary | ICD-10-CM | POA: Insufficient documentation

## 2019-01-29 DIAGNOSIS — K219 Gastro-esophageal reflux disease without esophagitis: Secondary | ICD-10-CM | POA: Diagnosis not present

## 2019-01-29 HISTORY — PX: CATARACT EXTRACTION W/PHACO: SHX586

## 2019-01-29 SURGERY — PHACOEMULSIFICATION, CATARACT, WITH IOL INSERTION
Anesthesia: Monitor Anesthesia Care | Site: Eye | Laterality: Left

## 2019-01-29 MED ORDER — MIDAZOLAM HCL 2 MG/2ML IJ SOLN
INTRAMUSCULAR | Status: AC
  Filled 2019-01-29: qty 2

## 2019-01-29 MED ORDER — CARBACHOL 0.01 % IO SOLN
INTRAOCULAR | Status: DC | PRN
  Administered 2019-01-29: 0.5 mL via INTRAOCULAR

## 2019-01-29 MED ORDER — EPINEPHRINE PF 1 MG/ML IJ SOLN
INTRAOCULAR | Status: DC | PRN
  Administered 2019-01-29: 09:00:00 via OPHTHALMIC

## 2019-01-29 MED ORDER — ONDANSETRON HCL 4 MG/2ML IJ SOLN
INTRAMUSCULAR | Status: DC | PRN
  Administered 2019-01-29: 4 mg via INTRAVENOUS

## 2019-01-29 MED ORDER — TRYPAN BLUE 0.06 % OP SOLN
OPHTHALMIC | Status: DC | PRN
  Administered 2019-01-29: 0.5 mL via INTRAOCULAR

## 2019-01-29 MED ORDER — MOXIFLOXACIN HCL 0.5 % OP SOLN
OPHTHALMIC | Status: DC | PRN
  Administered 2019-01-29: 0.2 mL via OPHTHALMIC

## 2019-01-29 MED ORDER — TETRACAINE HCL 0.5 % OP SOLN
1.0000 [drp] | OPHTHALMIC | Status: DC | PRN
  Administered 2019-01-29: 08:00:00 1 [drp] via OPHTHALMIC

## 2019-01-29 MED ORDER — MOXIFLOXACIN HCL 0.5 % OP SOLN - NO CHARGE
1.0000 [drp] | Freq: Once | OPHTHALMIC | Status: AC
  Administered 2019-01-29: 1 [drp] via OPHTHALMIC
  Filled 2019-01-29: qty 3

## 2019-01-29 MED ORDER — POVIDONE-IODINE 5 % OP SOLN
OPHTHALMIC | Status: DC | PRN
  Administered 2019-01-29: 1 via OPHTHALMIC

## 2019-01-29 MED ORDER — SODIUM CHLORIDE 0.9 % IV SOLN
INTRAVENOUS | Status: DC
  Administered 2019-01-29: 08:00:00 via INTRAVENOUS

## 2019-01-29 MED ORDER — FENTANYL CITRATE (PF) 100 MCG/2ML IJ SOLN
INTRAMUSCULAR | Status: DC | PRN
  Administered 2019-01-29: 50 ug via INTRAVENOUS

## 2019-01-29 MED ORDER — BSS IO SOLN
INTRAOCULAR | Status: DC | PRN
  Administered 2019-01-29: 15 mL via INTRAOCULAR

## 2019-01-29 MED ORDER — LIDOCAINE HCL (PF) 4 % IJ SOLN
INTRAOCULAR | Status: DC | PRN
  Administered 2019-01-29: 4 mL via OPHTHALMIC

## 2019-01-29 MED ORDER — PROVISC 10 MG/ML IO SOLN
INTRAOCULAR | Status: DC | PRN
  Administered 2019-01-29: .85 mL via INTRAOCULAR

## 2019-01-29 MED ORDER — MOXIFLOXACIN HCL 0.5 % OP SOLN
OPHTHALMIC | Status: AC
  Filled 2019-01-29: qty 3

## 2019-01-29 MED ORDER — ARMC OPHTHALMIC DILATING DROPS
OPHTHALMIC | Status: AC
  Administered 2019-01-29: 1 via OPHTHALMIC
  Filled 2019-01-29: qty 0.5

## 2019-01-29 MED ORDER — NA CHONDROIT SULF-NA HYALURON 40-17 MG/ML IO SOLN
INTRAOCULAR | Status: DC | PRN
  Administered 2019-01-29: 1 mL via INTRAOCULAR

## 2019-01-29 MED ORDER — TETRACAINE HCL 0.5 % OP SOLN
OPHTHALMIC | Status: AC
  Administered 2019-01-29: 1 [drp] via OPHTHALMIC
  Filled 2019-01-29: qty 4

## 2019-01-29 MED ORDER — ARMC OPHTHALMIC DILATING DROPS
1.0000 "application " | OPHTHALMIC | Status: AC
  Administered 2019-01-29 (×3): 1 via OPHTHALMIC

## 2019-01-29 MED ORDER — MIDAZOLAM HCL 2 MG/2ML IJ SOLN
INTRAMUSCULAR | Status: DC | PRN
  Administered 2019-01-29 (×2): 1 mg via INTRAVENOUS

## 2019-01-29 MED ORDER — FENTANYL CITRATE (PF) 100 MCG/2ML IJ SOLN
INTRAMUSCULAR | Status: AC
  Filled 2019-01-29: qty 2

## 2019-01-29 SURGICAL SUPPLY — 17 items
DISSECTOR HYDRO NUCLEUS 50X22 (MISCELLANEOUS) ×12 IMPLANT
DRSG TEGADERM 2-3/8X2-3/4 SM (GAUZE/BANDAGES/DRESSINGS) ×3 IMPLANT
GLOVE BIOGEL M 6.5 STRL (GLOVE) ×3 IMPLANT
GOWN STRL REUS W/ TWL LRG LVL3 (GOWN DISPOSABLE) ×1 IMPLANT
GOWN STRL REUS W/ TWL XL LVL3 (GOWN DISPOSABLE) ×1 IMPLANT
GOWN STRL REUS W/TWL LRG LVL3 (GOWN DISPOSABLE) ×2
GOWN STRL REUS W/TWL XL LVL3 (GOWN DISPOSABLE) ×2
KNIFE 45D UP 2.3 (MISCELLANEOUS) ×3 IMPLANT
LABEL CATARACT MEDS ST (LABEL) ×3 IMPLANT
LENS IOL TECNIS ITEC 17.0 (Intraocular Lens) ×3 IMPLANT
PACK CATARACT (MISCELLANEOUS) ×3 IMPLANT
PACK CATARACT KING (MISCELLANEOUS) ×3 IMPLANT
PACK EYE AFTER SURG (MISCELLANEOUS) ×3 IMPLANT
SOL BSS BAG (MISCELLANEOUS) ×3
SOLUTION BSS BAG (MISCELLANEOUS) ×1 IMPLANT
WATER STERILE IRR 250ML POUR (IV SOLUTION) ×3 IMPLANT
WIPE NON LINTING 3.25X3.25 (MISCELLANEOUS) ×3 IMPLANT

## 2019-01-29 NOTE — Discharge Instructions (Signed)
Eye Surgery Discharge Instructions    Expect mild scratchy sensation or mild soreness. DO NOT RUB YOUR EYE!  The day of surgery:  Minimal physical activity, but bed rest is not required  No reading, computer work, or close hand work  No bending, lifting, or straining.  May watch TV  For 24 hours:  No driving, legal decisions, or alcoholic beverages  Safety precautions  Eat anything you prefer: It is better to start with liquids, then soup then solid foods.  _____ Eye patch should be worn until postoperative exam tomorrow.  ____ Solar shield eyeglasses should be worn for comfort in the sunlight/patch while sleeping  Resume all regular medications including aspirin or Coumadin if these were discontinued prior to surgery. You may shower, bathe, shave, or wash your hair. Tylenol may be taken for mild discomfort.  Call your doctor if you experience significant pain, nausea, or vomiting, fever > 101 or other signs of infection. [phone] or [phone] Specific instructions:  Follow-up Information    Marchia Meiers, MD Follow up on 01/30/2019.   Specialty: Ophthalmology Why: @ 3:15 pm for post op visit Contact information: [address] [place] [address] [phone]          Eye Surgery Discharge Instructions    Expect mild scratchy sensation or mild soreness. DO NOT RUB YOUR EYE!  The day of surgery:  Minimal physical activity, but bed rest is not required  No reading, computer work, or close hand work  No bending, lifting, or straining.  May watch TV  For 24 hours:  No driving, legal decisions, or alcoholic beverages  Safety precautions  Eat anything you prefer: It is better to start with liquids, then soup then solid foods.  _____ Eye patch should be worn until postoperative exam tomorrow.  ____ Solar shield eyeglasses should be worn for comfort in the sunlight/patch while sleeping  Resume all regular medications including aspirin  or Coumadin if these were discontinued prior to surgery. You may shower, bathe, shave, or wash your hair. Tylenol may be taken for mild discomfort.  Call your doctor if you experience significant pain, nausea, or vomiting, fever > 101 or other signs of infection. [phone] or [phone] Specific instructions:  Follow-up Information    Marchia Meiers, MD Follow up on 01/30/2019.   Specialty: Ophthalmology Why: @ 3:15 pm for post op visit Contact information: [address] [place] [address] [phone]         Eye Surgery Discharge Instructions    Expect mild scratchy sensation or mild soreness. DO NOT RUB YOUR EYE!  The day of surgery:  Minimal physical activity, but bed rest is not required  No reading, computer work, or close hand work  No bending, lifting, or straining.  May watch TV  For 24 hours:  No driving, legal decisions, or alcoholic beverages  Safety precautions  Eat anything you prefer: It is better to start with liquids, then soup then solid foods.  _____ Eye patch should be worn until postoperative exam tomorrow.  ____ Solar shield eyeglasses should be worn for comfort in the sunlight/patch while sleeping  Resume all regular medications including aspirin or Coumadin if these were discontinued prior to surgery. You may shower, bathe, shave, or wash your hair. Tylenol may be taken for mild discomfort.  Call your doctor if you experience significant pain, nausea, or vomiting, fever > 101 or other signs of infection. [phone] or [phone] Specific instructions:  Follow-up Information    Marchia Meiers, MD Follow  up on 01/30/2019.   Specialty: Ophthalmology Why: @ 3:15 pm for post op visit Contact information: [address] [place] [address] [phone]

## 2019-01-29 NOTE — Anesthesia Post-op Follow-up Note (Signed)
Anesthesia QCDR form completed.        

## 2019-01-29 NOTE — Transfer of Care (Signed)
Immediate Anesthesia Transfer of Care Note  Patient: Nicole Austin  Procedure(s) Performed: CATARACT EXTRACTION PHACO AND INTRAOCULAR LENS PLACEMENT (IOC) LEFT Vision Blue (Left Eye)  Patient Location: PACU  Anesthesia Type:MAC  Level of Consciousness: awake, alert  and oriented  Airway & Oxygen Therapy: Patient Spontanous Breathing  Post-op Assessment: Report given to RN and Post -op Vital signs reviewed and stable  Post vital signs: Reviewed and stable  Last Vitals:  Vitals Value Taken Time  BP    Temp    Pulse    Resp    SpO2      Last Pain:  Vitals:   01/29/19 0736  TempSrc: Temporal  PainSc: 0-No pain         Complications: No apparent anesthesia complications

## 2019-01-29 NOTE — Anesthesia Preprocedure Evaluation (Signed)
Anesthesia Evaluation  Patient identified by MRN, date of birth, ID band Patient awake    Reviewed: Allergy & Precautions, H&P , NPO status , reviewed documented beta blocker date and time   Airway Mallampati: II  TM Distance: >3 FB Neck ROM: full    Dental  (+) Upper Dentures, Lower Dentures   Pulmonary shortness of breath, asthma , sleep apnea , COPD, Current Smoker and Patient abstained from smoking.,    Pulmonary exam normal        Cardiovascular + Past MI  Normal cardiovascular exam  02/2015 ECHO Study Conclusions  - Left ventricle: The cavity size was normal. Systolic function was   normal. The estimated ejection fraction was in the range of 55%   to 60%.   Neuro/Psych PSYCHIATRIC DISORDERS Anxiety Depression Bipolar Disorder  Neuromuscular disease CVA    GI/Hepatic GERD  Controlled,  Endo/Other    Renal/GU      Musculoskeletal  (+) Arthritis , Fibromyalgia -  Abdominal   Peds  Hematology   Anesthesia Other Findings Past Medical History: No date: ADHD (attention deficit hyperactivity disorder) 06/30/2015: Apnea, sleep No date: Arthritis No date: Asthma 06/30/2015: Asthma with acute exacerbation No date: Bipolar 1 disorder (HCC) No date: Chronic pain 11/2018: COPD (chronic obstructive pulmonary disease) (HCC)     Comment:  **LUNG SOUNDS WHEEZING AND WITH RALES** 11/18/2013: COPD, moderate ([place])     Comment:  Overview:  Last Assessment & Plan:  Refer to               pulmonologist Overview:  Overview:  Last Assessment &               Plan:  Pneumonia vaccine offered and given today; she did              not tolerate LABA; will start her on Spiriva; stop               combivent neb; use only SABA neb OR inhaler, not both;               smoking cessation urged, and I am here to help if she               wants assistance quitting; f/u in 4 weeks Last Assessment              & Plan:  Pneumonia vaccin No date:  Depression No date: Dyspnea     Comment:  with exertion 08/30/2015: GERD (gastroesophageal reflux disease) 08/16/2014: Gout 06/30/2015: History of acute myocardial infarction     Comment:  Overview:  [facility]  07/17/2013: HPV (human papilloma virus) infection No date: Hyperlipidemia 08/30/2015: Low HDL (under 40) 2018: MI (myocardial infarction) ([place]) No date: Parkinson disease ([place]) No date: Pre-diabetes 05/20/2016: Prediabetes No date: PTSD (post-traumatic stress disorder)     Comment:  history of panic attacks No date: PTSD (post-traumatic stress disorder) No date: PTSD (post-traumatic stress disorder) No date: Restless leg syndrome No date: Rhabdomyolysis 06/30/2015: Sleep apnea 11/18/2013: Stage 3 severe COPD by GOLD classification ([place]) 2018: Stroke ([place]) No date: Tremors of nervous system     Comment:  hands 06/30/2015: Uncomplicated asthma 33/54/5625: Varicella  Past Surgical History: No date: BACK SURGERY 01/01/2019: CATARACT EXTRACTION W/PHACO; Right     Comment:  Procedure: CATARACT EXTRACTION PHACO AND INTRAOCULAR               LENS PLACEMENT (IOC) right vision blue;  Surgeon: Neville Route,  Aaron Edelman, MD;  Location: [facility] ORS;  Service: Ophthalmology;               Laterality: Right;  [place] 00:39.4 CDE 5.49 Fluid Pack lot               # [id number] 08/07/2017: COLONOSCOPY WITH PROPOFOL; N/A     Comment:  Procedure: COLONOSCOPY WITH PROPOFOL;  Surgeon:               Virgel Manifold, MD;  Location: [facility] ENDOSCOPY;                Service: Endoscopy;  Laterality: N/A; 2016: JOINT REPLACEMENT; Left     Comment:  tkr 2016: REPLACEMENT TOTAL KNEE; Left  BMI    Body Mass Index: 40.81 kg/m      Reproductive/Obstetrics                             Anesthesia Physical Anesthesia Plan  ASA: III  Anesthesia Plan: MAC   Post-op Pain Management:    Induction: Intravenous  PONV Risk Score and Plan: Treatment may vary due to age or medical condition and  TIVA  Airway Management Planned: Nasal Cannula and Natural Airway  Additional Equipment:   Intra-op Plan:   Post-operative Plan:   Informed Consent: I have reviewed the patients History and Physical, chart, labs and discussed the procedure including the risks, benefits and alternatives for the proposed anesthesia with the patient or authorized representative who has indicated his/her understanding and acceptance.     Dental Advisory Given  Plan Discussed with: CRNA  Anesthesia Plan Comments:         Anesthesia Quick Evaluation

## 2019-01-29 NOTE — Anesthesia Postprocedure Evaluation (Signed)
Anesthesia Post Note  Patient: Jonet Mathies Faniel  Procedure(s) Performed: CATARACT EXTRACTION PHACO AND INTRAOCULAR LENS PLACEMENT (IOC) LEFT Vision Blue (Left Eye)  Patient location during evaluation: Phase II Anesthesia Type: MAC Level of consciousness: awake and alert Pain management: pain level controlled Vital Signs Assessment: post-procedure vital signs reviewed and stable Respiratory status: spontaneous breathing, nonlabored ventilation and respiratory function stable Cardiovascular status: blood pressure returned to baseline and stable Postop Assessment: no apparent nausea or vomiting Anesthetic complications: no     Last Vitals:  Vitals:   01/29/19 0736 01/29/19 0943  BP: 119/62 135/73  Pulse: 69 67  Resp: 16 16  Temp: (!) 35.7 C 36.5 C  SpO2: 97% 95%    Last Pain:  Vitals:   01/29/19 0943  TempSrc: Temporal  PainSc: 0-No pain                 Alphonsus Sias

## 2019-01-29 NOTE — Op Note (Signed)
  PREOPERATIVE DIAGNOSIS:  Nuclear sclerotic cataract of the LEFT eye.   POSTOPERATIVE DIAGNOSIS:  Nuclear sclerotic cataract of the LEFT eye.   OPERATIVE PROCEDURE: Cataract surgery OS   SURGEON:  Marchia Meiers, MD.   ANESTHESIA:  Anesthesiologist: Alphonsus Sias, MD CRNA: Disser, Einar Grad, CRNA  1.      Managed anesthesia care. 2.     0.57ml of Shugarcaine was instilled following the paracentesis   COMPLICATIONS:  None.   TECHNIQUE:   Divide and conquer   DESCRIPTION OF PROCEDURE:  The patient was examined and consented in the preoperative holding area where the aforementioned topical anesthesia was applied to the LEFT eye and then brought back to the Operating Room where the left eye was prepped and draped in the usual sterile ophthalmic fashion and a lid speculum was placed. A paracentesis was created with the side port blade, the anterior chamber was washed out with trypan blue to stain the anterior capsule, and the anterior chamber was filled with viscoelastic. A near clear corneal incision was performed with the steel keratome. A continuous curvilinear capsulorrhexis was performed with a cystotome followed by the capsulorrhexis forceps. Hydrodissection and hydrodelineation were carried out with BSS on a blunt cannula. The lens was removed in a divide and conquer  technique and the remaining cortical material was removed with the irrigation-aspiration handpiece. The capsular bag was inflated with viscoelastic and the lens was placed in the capsular bag without complication. The remaining viscoelastic was removed from the eye with the irrigation-aspiration handpiece. The wounds were hydrated. The anterior chamber was flushed and the eye was inflated to physiologic pressure. 0.16ml Vigamox was placed in the anterior chamber. The wounds were found to be water tight. The eye was dressed with Vigamox. The patient was given protective glasses to wear throughout the day and a shield with which to  sleep tonight. The patient was also given drops with which to begin a drop regimen today and will follow-up with me in one day. Implant Name Type Inv. Item Serial No. Manufacturer Lot No. LRB No. Used Action  LENS IOL DIOP 17.0 - [id number] Intraocular Lens LENS IOL DIOP 17.0 [id number] [employer]  Left 1 Implanted    Procedure(s) with comments: CATARACT EXTRACTION PHACO AND INTRAOCULAR LENS PLACEMENT (IOC) LEFT Vision Blue (Left) - [place] 00:51.1 CDE 4.38 Fluid Pack Lot # [id number]  Electronically signed: Marchia Meiers 01/29/2019 11:39 AM

## 2019-01-29 NOTE — H&P (Signed)
   I have reviewed the patient's H&P and agree with its findings. There have been no interval changes.  Mckenlee Mangham MD Ophthalmology 

## 2019-01-30 ENCOUNTER — Encounter: Payer: Self-pay | Admitting: Ophthalmology

## 2019-02-04 ENCOUNTER — Other Ambulatory Visit: Payer: Self-pay | Admitting: Family Medicine

## 2019-02-19 ENCOUNTER — Telehealth: Payer: Self-pay | Admitting: Family Medicine

## 2019-02-19 DIAGNOSIS — Z1231 Encounter for screening mammogram for malignant neoplasm of breast: Secondary | ICD-10-CM

## 2019-02-19 NOTE — Telephone Encounter (Signed)
Patient is calling to request a referral for a mammogram. Patient would like the referral placed over by [facility] [facility] Please advise [phone]---[phone]

## 2019-03-03 ENCOUNTER — Ambulatory Visit: Payer: Medicare Other

## 2019-03-17 ENCOUNTER — Ambulatory Visit (INDEPENDENT_AMBULATORY_CARE_PROVIDER_SITE_OTHER): Payer: Medicare Other

## 2019-03-17 ENCOUNTER — Other Ambulatory Visit: Payer: Self-pay

## 2019-03-17 VITALS — Ht 61.0 in | Wt 219.0 lb

## 2019-03-17 DIAGNOSIS — Z0000 Encounter for general adult medical examination without abnormal findings: Secondary | ICD-10-CM

## 2019-03-17 DIAGNOSIS — Z1211 Encounter for screening for malignant neoplasm of colon: Secondary | ICD-10-CM

## 2019-03-17 NOTE — Progress Notes (Signed)
Subjective:   Nicole Austin is a 59 y.o. female who presents for Medicare Annual (Subsequent) preventive examination.  Virtual Visit via Telephone Note  I connected with Nicole Austin on 03/17/19 at  2:10 PM EST by telephone and verified that I am speaking with the correct person using two identifiers.  Medicare Annual Wellness visit completed telephonically due to Covid-19 pandemic.   Location: Patient: home Provider: office   I discussed the limitations, risks, security and privacy concerns of performing an evaluation and management service by telephone and the availability of in person appointments. The patient expressed understanding and agreed to proceed.  Some vital signs may be absent or patient reported.   [name], LPN    Review of Systems:   Cardiac Risk Factors include: obesity (BMI >30kg/m2);smoking/ tobacco exposure;hypertension;dyslipidemia     Objective:     Vitals: Ht 5\' 1"  (1.549 m)   Wt 219 lb (99.3 kg)   LMP  (LMP Unknown)   BMI 41.38 kg/m   Body mass index is 41.38 kg/m.  Advanced Directives 03/17/2019 01/29/2019 01/01/2019 12/03/2018 02/27/2018 08/07/2017 01/10/2017  Does Patient Have a Medical Advance Directive? No No No No No No No  Type of Advance Directive - - - - - - -  Does patient want to make changes to medical advance directive? - - - - - - -  Copy of [place] in Chart? - - - - - - -  Would patient like information on creating a medical advance directive? Yes (MAU/Ambulatory/Procedural Areas - Information given) No - Patient declined No - Patient declined No - Patient declined Yes (MAU/Ambulatory/Procedural Areas - Information given) - No - Patient declined  Some encounter information is confidential and restricted. Go to Review Flowsheets activity to see all data.    Tobacco Social History   Tobacco Use  Smoking Status Current Every Day Smoker  . Packs/day: 0.50  . Years: 36.00  . Pack years: 18.00   . Types: Cigarettes  Smokeless Tobacco Never Used     Ready to quit: Not Answered Counseling given: Not Answered   Clinical Intake:  Pre-visit preparation completed: Yes  Pain : 0-10 Pain Score: 7  Pain Type: Acute pain Pain Location: Back(neck, right knee) Pain Descriptors / Indicators: Aching, Discomfort, Sore, Other (Comment)(stiff) Pain Onset: 1 to 4 weeks ago Pain Frequency: Constant     BMI - recorded: 41.38 Nutritional Status: BMI > 30  Obese Nutritional Risks: None Diabetes: No  How often do you need to have someone help you when you read instructions, pamphlets, or other written materials from your doctor or pharmacy?: 1 - Never  Interpreter Needed?: No  Information entered by :: [name] LPN  Past Medical History:  Diagnosis Date  . ADHD (attention deficit hyperactivity disorder)   . Apnea, sleep 06/30/2015  . Arthritis   . Asthma   . Asthma with acute exacerbation 06/30/2015  . Bipolar 1 disorder ([place])   . Chronic pain   . COPD (chronic obstructive pulmonary disease) ([place]) 11/2018   **LUNG SOUNDS WHEEZING AND WITH RALES**  . COPD, moderate ([place]) 11/18/2013   Overview:  Last Assessment & Plan:  Refer to pulmonologist Overview:  Overview:  Last Assessment & Plan:  Pneumonia vaccine offered and given today; she did not tolerate LABA; will start her on Spiriva; stop combivent neb; use only SABA neb OR inhaler, not both; smoking cessation urged, and I am here to help if she wants assistance quitting; f/u  in 4 weeks Last Assessment & Plan:  Pneumonia vaccin  . Depression   . Dyspnea    with exertion  . GERD (gastroesophageal reflux disease) 08/30/2015  . Gout 08/16/2014  . History of acute myocardial infarction 06/30/2015   Overview:  [facility]   . HPV (human papilloma virus) infection 07/17/2013  . Hyperlipidemia   . Low HDL (under 40) 08/30/2015  . MI (myocardial infarction) ([place]) 2018  . Parkinson disease ([place])   . Pre-diabetes   . Prediabetes 05/20/2016  . PTSD  (post-traumatic stress disorder)    history of panic attacks  . PTSD (post-traumatic stress disorder)   . PTSD (post-traumatic stress disorder)   . Restless leg syndrome   . Rhabdomyolysis   . Sleep apnea 06/30/2015  . Stage 3 severe COPD by GOLD classification ([place]) 11/18/2013  . Stroke ([place]) 2018  . Tremors of nervous system    hands  . Uncomplicated asthma [id number]  . Varicella 02/25/2017   Past Surgical History:  Procedure Laterality Date  . BACK SURGERY    . CATARACT EXTRACTION W/PHACO Right 01/01/2019   Procedure: CATARACT EXTRACTION PHACO AND INTRAOCULAR LENS PLACEMENT (IOC) right vision blue;  Surgeon: Marchia Meiers, MD;  Location: [facility] ORS;  Service: Ophthalmology;  Laterality: Right;  [place] 00:39.4 CDE 5.49 Fluid Pack lot # [id number]  . CATARACT EXTRACTION W/PHACO Left 01/29/2019   Procedure: CATARACT EXTRACTION PHACO AND INTRAOCULAR LENS PLACEMENT ([place]) LEFT Vision Blue;  Surgeon: Marchia Meiers, MD;  Location: [facility] ORS;  Service: Ophthalmology;  Laterality: Left;  [place] 00:51.1 CDE 4.38 Fluid Pack Lot # [id number]  . COLONOSCOPY WITH PROPOFOL N/A 08/07/2017   Procedure: COLONOSCOPY WITH PROPOFOL;  Surgeon: Virgel Manifold, MD;  Location: [facility] ENDOSCOPY;  Service: Endoscopy;  Laterality: N/A;  . JOINT REPLACEMENT Left 2016   tkr  . REPLACEMENT TOTAL KNEE Left 2016   Family History  Problem Relation Age of Onset  . Hernia Mother   . Heart disease Mother   . OCD Mother   . Diabetes Mother   . Parkinson's disease Father   . Bipolar disorder Sister   . Schizophrenia Sister   . ADD / ADHD Sister   . Alcohol abuse Brother   . Bipolar disorder Sister   . Paranoid behavior Sister   . ADD / ADHD Sister   . ADD / ADHD Son   . Dementia Maternal Grandmother   . Emphysema Maternal Grandfather   . ADD / ADHD Son   . ADD / ADHD Son   . Depression Son    Social History   Socioeconomic History  . Marital status: Single    Spouse name: Haynes Dage - sister  . Number of children: 3   . Years of education: Not on file  . Highest education level: 10th grade  Occupational History  . Occupation: Disability  Tobacco Use  . Smoking status: Current Every Day Smoker    Packs/day: 0.50    Years: 36.00    Pack years: 18.00    Types: Cigarettes  . Smokeless tobacco: Never Used  Substance and Sexual Activity  . Alcohol use: No    Alcohol/week: 0.0 standard drinks  . Drug use: No  . Sexual activity: Not Currently  Other Topics Concern  . Not on file  Social History Narrative  . Not on file   Social Determinants of Health   Financial Resource Strain: Medium Risk  . Difficulty of Paying Living Expenses: Somewhat hard  Food Insecurity: No Food Insecurity  . Worried  About Running Out of Food in the Last Year: Never true  . Ran Out of Food in the Last Year: Never true  Transportation Needs: Unmet Transportation Needs  . Lack of Transportation (Medical): Yes  . Lack of Transportation (Non-Medical): Yes  Physical Activity: Inactive  . Days of Exercise per Week: 0 days  . Minutes of Exercise per Session: 0 min  Stress: Stress Concern Present  . Feeling of Stress : Rather much  Social Connections: Unknown  . Frequency of Communication with Friends and Family: Once a week  . Frequency of Social Gatherings with Friends and Family: Once a week  . Attends Religious Services: Never  . Active Member of Clubs or Organizations: No  . Attends Archivist Meetings: Never  . Marital Status: Patient refused    Outpatient Encounter Medications as of 03/17/2019  Medication Sig  . ALPRAZolam (XANAX) 0.5 MG tablet Take 0.5 mg by mouth 4 (four) times daily as needed for anxiety or sleep. And bedtime if neede  . amantadine (SYMMETREL) 100 MG capsule Take 1 capsule by mouth 2 (two) times daily.  Jearl Klinefelter ELLIPTA 62.5-25 MCG/INH AEPB INHALE 1 PUFF INTO THE LUNGS DAILY TO REPLACE SPIRIVA (Patient taking differently: Inhale 1 puff into the lungs daily as needed. )  . benztropine  (COGENTIN) 1 MG tablet Take 1 mg by mouth at bedtime.  . divalproex (DEPAKOTE) 250 MG DR tablet Take 1 tablet (250 mg total) by mouth 2 (two) times daily.  Marland Kitchen escitalopram (LEXAPRO) 20 MG tablet Take 1 tablet (20 mg total) by mouth daily.  Marland Kitchen LATUDA 60 MG TABS Take 1 tablet by mouth at bedtime.  . Multiple Vitamin (MULTIVITAMIN WITH MINERALS) TABS tablet Take 1 tablet by mouth daily.  Marland Kitchen rOPINIRole (REQUIP) 4 MG tablet Take 4 mg by mouth at bedtime.   Marland Kitchen tiZANidine (ZANAFLEX) 2 MG tablet TAKE 1 TO 2 TABLETS BY MOUTH 3 TIMES DAILY AS NEEDED FOR MUSCLE SPASMS  . [DISCONTINUED] lurasidone (LATUDA) 40 MG TABS tablet Take 60 mg by mouth every evening.   . [DISCONTINUED] meloxicam (MOBIC) 15 MG tablet Take 15 mg by mouth daily.   No facility-administered encounter medications on file as of 03/17/2019.    Activities of Daily Living In your present state of health, do you have any difficulty performing the following activities: 03/17/2019 12/03/2018  Hearing? N N  Comment declines hearing aids -  Vision? N Y  Difficulty concentrating or making decisions? Y N  Comment concentrating -  Walking or climbing stairs? N N  Dressing or bathing? N N  Doing errands, shopping? N N  Preparing Food and eating ? N -  Using the Toilet? N -  In the past six months, have you accidently leaked urine? Y -  Comment wears pads for protection -  Do you have problems with loss of bowel control? N -  Managing your Medications? N -  Managing your Finances? N -  Housekeeping or managing your Housekeeping? N -  Some recent data might be hidden    Patient Care Team: Delsa Grana, PA-C as PCP - General (Family Medicine) Rainey Pines, MD as Referring Physician (Psychiatry) Virgel Manifold, MD as Consulting Physician (Gastroenterology) Vladimir Crofts, MD as Consulting Physician (Neurology) Vern Claude, [place] as [facility] Management    Assessment:   This is a routine wellness examination for  Nicole Austin.  Exercise Activities and Dietary recommendations Current Exercise Habits: The patient does not participate in regular exercise  at present, Exercise limited by: orthopedic condition(s)  Goals    . DIET - INCREASE WATER INTAKE     Recommend increasing water to 6-8 glasses per day    . Eat more fruits and vegetables    . Increase physical activity       Fall Risk Fall Risk  03/17/2019 12/03/2018 10/14/2018 05/02/2018 02/27/2018  Falls in the past year? 1 0 0 1 1  Comment - - - - -  Number falls in past yr: 1 0 0 1 1  Comment slipped on steps - - - -  Injury with Fall? 0 0 0 0 0  Comment - - - - tripped in house, no major injury  Risk Factor Category  - - - - -  Risk for fall due to : History of fall(s) - - - History of fall(s)  Follow up Falls prevention discussed - - - Falls evaluation completed;Falls prevention discussed  Comment - - - - -   FALL RISK PREVENTION PERTAINING TO THE HOME:  Any stairs in or around the home? No  If so, do they handrails? No   Home free of loose throw rugs in walkways, pet beds, electrical cords, etc? Yes  Adequate lighting in your home to reduce risk of falls? Yes   ASSISTIVE DEVICES UTILIZED TO PREVENT FALLS:  Life alert? No  Use of a cane, walker or w/c? Yes  Grab bars in the bathroom? No  Shower chair or bench in shower? Yes  Elevated toilet seat or a handicapped toilet? No   DME ORDERS:  DME order needed?  Yes   TIMED UP AND GO:  Was the test performed? No . Telephonic visit.   Education: Fall risk prevention has been discussed.  Intervention(s) required? Yes  - pt states unable to add grab bars in bathroom at her apartment   Depression Screen PHQ 2/9 Scores 03/17/2019 12/23/2018 12/03/2018 10/14/2018  PHQ - 2 Score 6 2 4  0  PHQ- 9 Score 16 11 19  0  Exception Documentation - - - -     Cognitive Function     6CIT Screen 03/17/2019 02/27/2018  What Year? 0 points 0 points  What month? 0 points 0 points  What time? 0 points  0 points  Count back from 20 0 points 0 points  Months in reverse 4 points 0 points  Repeat phrase 0 points 4 points  Total Score 4 4    Immunization History  Administered Date(s) Administered  . Influenza Split 12/15/2012  . Influenza,inj,Quad PF,6+ Mos 10/27/2013, 12/22/2014, 12/13/2015, 10/16/2016, 05/02/2018  . Influenza-Unspecified 11/07/2011, 12/15/2012  . Pneumococcal Polysaccharide-23 06/21/2015  . Tdap 12/13/2015  . Zoster Recombinat (Shingrix) 07/25/2018, 10/08/2018    Qualifies for Shingles Vaccine? Yes  Shingrix series completed.   Tdap: Up to date  Flu Vaccine: Due for Flu vaccine. Does the patient want to receive this vaccine today?  No . Education has been provided regarding the importance of this vaccine but still declined. Advised may receive this vaccine at local pharmacy or Health Dept. Aware to provide a copy of the vaccination record if obtained from local pharmacy or Health Dept. Verbalized acceptance and understanding.  Pneumococcal Vaccine: Up to date   Screening Tests Health Maintenance  Topic Date Due  . MAMMOGRAM  07/01/1977  . COLONOSCOPY  08/08/2018  . INFLUENZA VACCINE  09/27/2018  . PAP SMEAR-Modifier  05/01/2021  . TETANUS/TDAP  12/12/2025  . Hepatitis C Screening  Completed  . HIV Screening  Completed    Cancer Screenings:  Colorectal Screening: Due - pt previously scheduled for colonoscopy 07/2017 but procedure unsuccessful. New referral to GI  Sent today.   Mammogram: Completed due.  Ordered 02/23/19. Pt provided with contact information and advised to call to schedule appt.   Bone Density: due at age 39   Lung Cancer Screening: (Low Dose CT Chest recommended if Age 60-80 years, 30 pack-year currently smoking OR have quit w/in 15years.) does qualify.   Lung Cancer Screening Referral: An Epic message has been sent to Burgess Estelle, RN (Oncology Nurse Navigator) regarding the possible need for this exam. Raquel Sarna will review the patient's  chart to determine if the patient truly qualifies for the exam. If the patient qualifies, Raquel Sarna will order the Low Dose CT of the chest to facilitate the scheduling of this exam. Pt previously scheduled for lung cancer screening and did not keep appt, msg sent to [facility].   Additional Screening:  Hepatitis C Screening: does qualify; Completed 10/16/16  Vision Screening: Recommended annual ophthalmology exams for early detection of glaucoma and other disorders of the eye. Is the patient up to date with their annual eye exam?  Yes  Who is the provider or what is the name of the office in which the pt attends annual eye exams? [place] Screening: Recommended annual dental exams for proper oral hygiene  Community Resource Referral:  CRR required this visit?  No      Plan:     I have personally reviewed and addressed the Medicare Annual Wellness questionnaire and have noted the following in the patient's chart:  A. Medical and social history B. Use of alcohol, tobacco or illicit drugs  C. Current medications and supplements D. Functional ability and status E.  Nutritional status F.  Physical activity G. Advance directives H. List of other physicians I.  Hospitalizations, surgeries, and ER visits in previous 12 months J.  [place] such as hearing and vision if needed, cognitive and depression L. Referrals and appointments   In addition, I have reviewed and discussed with patient certain preventive protocols, quality metrics, and best practice recommendations. A written personalized care plan for preventive services as well as general preventive health recommendations were provided to patient.   Signed,  [name], LPN Nurse Health Advisor   Nurse Notes: PHQ9 score of 16 today but patient is doing better with her current living situation and financial stability. She reports having a therapist come to her home twice a week and feels this is helpful. Pt  has been in contact with [facility] social worker and also sees a Teacher, music.

## 2019-03-17 NOTE — Patient Instructions (Signed)
Nicole Austin , Thank you for taking time to come for your Medicare Wellness Visit. I appreciate your ongoing commitment to your health goals. Please review the following plan we discussed and let me know if I can assist you in the future.   Screening recommendations/referrals: Colonoscopy: due - referral sent to [facility] to reschedule Mammogram: Please call [phone] to schedule your mammogram.  Bone Density: due at age 60 Recommended yearly ophthalmology/optometry visit for glaucoma screening and checkup Recommended yearly dental visit for hygiene and checkup  Vaccinations: Influenza vaccine: done 05/02/18  Pneumococcal vaccine: done 06/21/15 Tdap vaccine: done 12/13/15 Shingles vaccine: Shingrix series completed 10/08/18  Advanced directives: Advance directive discussed with you today. I have provided a copy for you to complete at home and have notarized. Once this is complete please bring a copy in to our office so we can scan it into your chart.  Conditions/risks identified: Recommend increasing physical activity   Next appointment: Please follow up in one year for your Medicare Annual Wellness visit.    Preventive Care 40-64 Years, Female Preventive care refers to lifestyle choices and visits with your health care provider that can promote health and wellness. What does preventive care include?  A yearly physical exam. This is also called an annual well check.  Dental exams once or twice a year.  Routine eye exams. Ask your health care provider how often you should have your eyes checked.  Personal lifestyle choices, including:  Daily care of your teeth and gums.  Regular physical activity.  Eating a healthy diet.  Avoiding tobacco and drug use.  Limiting alcohol use.  Practicing safe sex.  Taking low-dose aspirin daily starting at age 14.  Taking vitamin and mineral supplements as recommended by your health care provider. What happens during an annual well  check? The services and screenings done by your health care provider during your annual well check will depend on your age, overall health, lifestyle risk factors, and family history of disease. Counseling  Your health care provider may ask you questions about your:  Alcohol use.  Tobacco use.  Drug use.  Emotional well-being.  Home and relationship well-being.  Sexual activity.  Eating habits.  Work and work Statistician.  Method of birth control.  Menstrual cycle.  Pregnancy history. Screening  You may have the following tests or measurements:  Height, weight, and BMI.  Blood pressure.  Lipid and cholesterol levels. These may be checked every 5 years, or more frequently if you are over 40 years old.  Skin check.  Lung cancer screening. You may have this screening every year starting at age 62 if you have a 30-pack-year history of smoking and currently smoke or have quit within the past 15 years.  Fecal occult blood test (FOBT) of the stool. You may have this test every year starting at age 43.  Flexible sigmoidoscopy or colonoscopy. You may have a sigmoidoscopy every 5 years or a colonoscopy every 10 years starting at age 61.  Hepatitis C blood test.  Hepatitis B blood test.  Sexually transmitted disease (STD) testing.  Diabetes screening. This is done by checking your blood sugar (glucose) after you have not eaten for a while (fasting). You may have this done every 1-3 years.  Mammogram. This may be done every 1-2 years. Talk to your health care provider about when you should start having regular mammograms. This may depend on whether you have a family history of breast cancer.  BRCA-related cancer screening. This may  be done if you have a family history of breast, ovarian, tubal, or peritoneal cancers.  Pelvic exam and Pap test. This may be done every 3 years starting at age 44. Starting at age 72, this may be done every 5 years if you have a Pap test in  combination with an HPV test.  Bone density scan. This is done to screen for osteoporosis. You may have this scan if you are at high risk for osteoporosis. Discuss your test results, treatment options, and if necessary, the need for more tests with your health care provider. Vaccines  Your health care provider may recommend certain vaccines, such as:  Influenza vaccine. This is recommended every year.  Tetanus, diphtheria, and acellular pertussis (Tdap, Td) vaccine. You may need a Td booster every 10 years.  Zoster vaccine. You may need this after age 30.  Pneumococcal 13-valent conjugate (PCV13) vaccine. You may need this if you have certain conditions and were not previously vaccinated.  Pneumococcal polysaccharide (PPSV23) vaccine. You may need one or two doses if you smoke cigarettes or if you have certain conditions. Talk to your health care provider about which screenings and vaccines you need and how often you need them. This information is not intended to replace advice given to you by your health care provider. Make sure you discuss any questions you have with your health care provider. Document Released: 03/11/2015 Document Revised: 11/02/2015 Document Reviewed: 12/14/2014 Elsevier Interactive Patient Education  2017 [place] Prevention in the Home Falls can cause injuries. They can happen to people of all ages. There are many things you can do to make your home safe and to help prevent falls. What can I do on the outside of my home?  Regularly fix the edges of walkways and driveways and fix any cracks.  Remove anything that might make you trip as you walk through a door, such as a raised step or threshold.  Trim any bushes or trees on the path to your home.  Use bright outdoor lighting.  Clear any walking paths of anything that might make someone trip, such as rocks or tools.  Regularly check to see if handrails are loose or broken. Make sure that both  sides of any steps have handrails.  Any raised decks and porches should have guardrails on the edges.  Have any leaves, snow, or ice cleared regularly.  Use sand or salt on walking paths during winter.  Clean up any spills in your garage right away. This includes oil or grease spills. What can I do in the bathroom?  Use night lights.  Install grab bars by the toilet and in the tub and shower. Do not use towel bars as grab bars.  Use non-skid mats or decals in the tub or shower.  If you need to sit down in the shower, use a plastic, non-slip stool.  Keep the floor dry. Clean up any water that spills on the floor as soon as it happens.  Remove soap buildup in the tub or shower regularly.  Attach bath mats securely with double-sided non-slip rug tape.  Do not have throw rugs and other things on the floor that can make you trip. What can I do in the bedroom?  Use night lights.  Make sure that you have a light by your bed that is easy to reach.  Do not use any sheets or blankets that are too big for your bed. They should not hang down onto  the floor.  Have a firm chair that has side arms. You can use this for support while you get dressed.  Do not have throw rugs and other things on the floor that can make you trip. What can I do in the kitchen?  Clean up any spills right away.  Avoid walking on wet floors.  Keep items that you use a lot in easy-to-reach places.  If you need to reach something above you, use a strong step stool that has a grab bar.  Keep electrical cords out of the way.  Do not use floor polish or wax that makes floors slippery. If you must use wax, use non-skid floor wax.  Do not have throw rugs and other things on the floor that can make you trip. What can I do with my stairs?  Do not leave any items on the stairs.  Make sure that there are handrails on both sides of the stairs and use them. Fix handrails that are broken or loose. Make sure that  handrails are as long as the stairways.  Check any carpeting to make sure that it is firmly attached to the stairs. Fix any carpet that is loose or worn.  Avoid having throw rugs at the top or bottom of the stairs. If you do have throw rugs, attach them to the floor with carpet tape.  Make sure that you have a light switch at the top of the stairs and the bottom of the stairs. If you do not have them, ask someone to add them for you. What else can I do to help prevent falls?  Wear shoes that:  Do not have high heels.  Have rubber bottoms.  Are comfortable and fit you well.  Are closed at the toe. Do not wear sandals.  If you use a stepladder:  Make sure that it is fully opened. Do not climb a closed stepladder.  Make sure that both sides of the stepladder are locked into place.  Ask someone to hold it for you, if possible.  Clearly mark and make sure that you can see:  Any grab bars or handrails.  First and last steps.  Where the edge of each step is.  Use tools that help you move around (mobility aids) if they are needed. These include:  Canes.  Walkers.  Scooters.  Crutches.  Turn on the lights when you go into a dark area. Replace any light bulbs as soon as they burn out.  Set up your furniture so you have a clear path. Avoid moving your furniture around.  If any of your floors are uneven, fix them.  If there are any pets around you, be aware of where they are.  Review your medicines with your doctor. Some medicines can make you feel dizzy. This can increase your chance of falling. Ask your doctor what other things that you can do to help prevent falls. This information is not intended to replace advice given to you by your health care provider. Make sure you discuss any questions you have with your health care provider. Document Released: 12/09/2008 Document Revised: 07/21/2015 Document Reviewed: 03/19/2014 Elsevier Interactive Patient Education  2017  [employer].

## 2019-03-19 ENCOUNTER — Telehealth: Payer: Self-pay | Admitting: *Deleted

## 2019-03-19 DIAGNOSIS — Z87891 Personal history of nicotine dependence: Secondary | ICD-10-CM

## 2019-03-19 NOTE — Telephone Encounter (Signed)
Received referral for initial lung cancer screening scan. Contacted patient and obtained smoking history,(current, 36 pack year) as well as answering questions related to screening process. Patient denies signs of lung cancer such as weight loss or hemoptysis. Patient denies comorbidity that would prevent curative treatment if lung cancer were found. Patient is scheduled for shared decision making visit and CT scan on 04/01/19 at 10am.

## 2019-03-24 ENCOUNTER — Encounter: Payer: Self-pay | Admitting: *Deleted

## 2019-04-01 ENCOUNTER — Ambulatory Visit: Payer: Medicare Other

## 2019-04-01 ENCOUNTER — Inpatient Hospital Stay: Payer: Medicare Other | Admitting: Oncology

## 2019-04-08 ENCOUNTER — Inpatient Hospital Stay: Payer: Medicare Other | Attending: Oncology | Admitting: Oncology

## 2019-04-08 ENCOUNTER — Encounter: Payer: Self-pay | Admitting: Oncology

## 2019-04-08 ENCOUNTER — Ambulatory Visit: Payer: Medicare Other | Attending: Oncology

## 2019-04-08 ENCOUNTER — Other Ambulatory Visit: Payer: Self-pay | Admitting: Family Medicine

## 2019-04-08 DIAGNOSIS — Z87891 Personal history of nicotine dependence: Secondary | ICD-10-CM

## 2019-04-08 NOTE — Progress Notes (Addendum)
This patient was a no show.   Will get her rescheduled.  Faythe Casa, NP 04/08/2019 11:18 AM

## 2019-04-14 ENCOUNTER — Other Ambulatory Visit: Payer: Self-pay | Admitting: Family Medicine

## 2019-04-14 ENCOUNTER — Encounter: Payer: Self-pay | Admitting: *Deleted

## 2019-04-14 MED ORDER — ANORO ELLIPTA 62.5-25 MCG/INH IN AEPB: 1.0000 | INHALATION_SPRAY | Freq: Every day | RESPIRATORY_TRACT | 11 refills | Status: DC

## 2019-04-14 NOTE — Telephone Encounter (Signed)
Patient requesting refill of Spiriva. Patient was unsure if this medication was prescribed by [facility] or another location.     [name], Nicole Austin Phone:  [phone]  Fax:  [phone]

## 2019-04-14 NOTE — Telephone Encounter (Signed)
Refill request for general medication.  Last office visit: 12/03/2018  No follow-ups on file.

## 2019-05-01 ENCOUNTER — Telehealth: Payer: Self-pay | Admitting: Family Medicine

## 2019-05-01 DIAGNOSIS — B351 Tinea unguium: Secondary | ICD-10-CM

## 2019-05-01 NOTE — Telephone Encounter (Signed)
Patient is requesting referral to the foot doctor Dr Elvina Mattes at [facility]. Please advise [phone]

## 2019-05-01 NOTE — Telephone Encounter (Signed)
For toe nail trimming and toenail fungus

## 2019-05-05 NOTE — Addendum Note (Signed)
Addended by: Docia Furl on: 05/05/2019 09:08 AM   Modules accepted: Orders

## 2019-05-11 ENCOUNTER — Encounter: Payer: Medicare Other | Admitting: Family Medicine

## 2019-05-12 ENCOUNTER — Encounter: Payer: Self-pay | Admitting: Family Medicine

## 2019-05-13 ENCOUNTER — Ambulatory Visit: Payer: Self-pay | Admitting: *Deleted

## 2019-05-13 NOTE — Telephone Encounter (Addendum)
Per initial encounter, addendum to original triage note; initial encounter placed; notified Melissa and Jamie at [place] of addendum; the verbalized understanding.  Message from Esaw Dace sent at 05/13/2019 12:59 PM EDT  Summary: Cold chills   Pt stated she has been having chills along with sweating and lower back pain. She would like to speak with a nurse about this.         Call History   Type Contact  05/13/2019 02:35 PM EDT Phone (Outgoing) Lozano, Soliel Claypool (Self)  Phone: [phone] (H)  User: Addison Naegeli, RN  Left Message   05/13/2019 01:33 PM EDT Phone (Outgoing) Dyas, Gennie Alma (Self)  Phone: [phone] (H)  User: Addison Naegeli, RN  Left Message   05/13/2019 01:02 PM EDT Phone (Outgoing) Vilardi, Gennie Alma (Self)  Phone: [phone] (H)  User: Addison Naegeli, RN  Left Message   05/13/2019 12:57 PM EDT Phone (Incoming) Macneill, Gennie Alma (Self)  Phone: [phone] (H)  User: Ky Barban

## 2019-05-13 NOTE — Telephone Encounter (Addendum)
3rd attempt to contact pt; left messages on voicemail; unable to complete nurse triage at this time; she is seen by Delsa Grana, [place]; will route to office for final disposition.  Message from Esaw Dace sent at 05/13/2019 12:59 PM EDT  Summary: Cold chills   Pt stated she has been having chills along with sweating and lower back pain. She would like to speak with a nurse about this.         Call History   Type Contact  05/13/2019 02:35 PM EDT Phone (Outgoing) Austin, Nicole Milkey (Self)  Phone: [phone] (H)  User: Addison Naegeli, RN  Left Message   05/13/2019 01:33 PM EDT Phone (Outgoing) Austin, Nicole Alma (Self)  Phone: [phone] (H)  User: Addison Naegeli, RN  Left Message   05/13/2019 01:02 PM EDT Phone (Outgoing) Austin, Nicole Alma (Self)  Phone: [phone] (H)  User: Addison Naegeli, RN  Left Message   05/13/2019 12:57 PM EDT Phone (Incoming) Austin, Nicole Alma (Self)  Phone: [phone] (H)  User: Ky Barban

## 2019-05-13 NOTE — Telephone Encounter (Signed)
Sorry  Im confused on what this is regarding?

## 2019-05-13 NOTE — Telephone Encounter (Signed)
Pt will need virtual visit

## 2019-05-14 ENCOUNTER — Ambulatory Visit (INDEPENDENT_AMBULATORY_CARE_PROVIDER_SITE_OTHER): Payer: Medicare Other | Admitting: Internal Medicine

## 2019-05-14 ENCOUNTER — Encounter: Payer: Self-pay | Admitting: Internal Medicine

## 2019-05-14 ENCOUNTER — Telehealth: Payer: Self-pay

## 2019-05-14 ENCOUNTER — Inpatient Hospital Stay
Admission: EM | Admit: 2019-05-14 | Discharge: 2019-05-17 | DRG: 872 | Disposition: A | Discharge status: Home / Self Care | Payer: Medicare Other | Attending: Internal Medicine | Admitting: Internal Medicine

## 2019-05-14 ENCOUNTER — Emergency Department: Payer: Medicare Other

## 2019-05-14 ENCOUNTER — Other Ambulatory Visit: Payer: Self-pay

## 2019-05-14 VITALS — Ht 61.0 in | Wt 225.0 lb

## 2019-05-14 DIAGNOSIS — F17210 Nicotine dependence, cigarettes, uncomplicated: Secondary | ICD-10-CM | POA: Diagnosis present

## 2019-05-14 DIAGNOSIS — M797 Fibromyalgia: Secondary | ICD-10-CM | POA: Diagnosis present

## 2019-05-14 DIAGNOSIS — F4310 Post-traumatic stress disorder, unspecified: Secondary | ICD-10-CM | POA: Diagnosis present

## 2019-05-14 DIAGNOSIS — F410 Panic disorder [episodic paroxysmal anxiety] without agoraphobia: Secondary | ICD-10-CM | POA: Diagnosis present

## 2019-05-14 DIAGNOSIS — K219 Gastro-esophageal reflux disease without esophagitis: Secondary | ICD-10-CM | POA: Diagnosis present

## 2019-05-14 DIAGNOSIS — B9620 Unspecified Escherichia coli [E. coli] as the cause of diseases classified elsewhere: Secondary | ICD-10-CM | POA: Diagnosis present

## 2019-05-14 DIAGNOSIS — F329 Major depressive disorder, single episode, unspecified: Secondary | ICD-10-CM | POA: Diagnosis present

## 2019-05-14 DIAGNOSIS — G8929 Other chronic pain: Secondary | ICD-10-CM | POA: Diagnosis present

## 2019-05-14 DIAGNOSIS — A419 Sepsis, unspecified organism: Secondary | ICD-10-CM | POA: Diagnosis present

## 2019-05-14 DIAGNOSIS — F909 Attention-deficit hyperactivity disorder, unspecified type: Secondary | ICD-10-CM | POA: Diagnosis present

## 2019-05-14 DIAGNOSIS — R3915 Urgency of urination: Secondary | ICD-10-CM

## 2019-05-14 DIAGNOSIS — G20 Parkinson's disease: Secondary | ICD-10-CM | POA: Diagnosis present

## 2019-05-14 DIAGNOSIS — M109 Gout, unspecified: Secondary | ICD-10-CM | POA: Diagnosis present

## 2019-05-14 DIAGNOSIS — E785 Hyperlipidemia, unspecified: Secondary | ICD-10-CM | POA: Diagnosis present

## 2019-05-14 DIAGNOSIS — J449 Chronic obstructive pulmonary disease, unspecified: Secondary | ICD-10-CM | POA: Diagnosis present

## 2019-05-14 DIAGNOSIS — G2581 Restless legs syndrome: Secondary | ICD-10-CM | POA: Diagnosis present

## 2019-05-14 DIAGNOSIS — Z8249 Family history of ischemic heart disease and other diseases of the circulatory system: Secondary | ICD-10-CM | POA: Diagnosis not present

## 2019-05-14 DIAGNOSIS — Z6841 Body Mass Index (BMI) 40.0 and over, adult: Secondary | ICD-10-CM

## 2019-05-14 DIAGNOSIS — Z833 Family history of diabetes mellitus: Secondary | ICD-10-CM

## 2019-05-14 DIAGNOSIS — Z96652 Presence of left artificial knee joint: Secondary | ICD-10-CM | POA: Diagnosis present

## 2019-05-14 DIAGNOSIS — M1990 Unspecified osteoarthritis, unspecified site: Secondary | ICD-10-CM | POA: Diagnosis present

## 2019-05-14 DIAGNOSIS — R6883 Chills (without fever): Secondary | ICD-10-CM

## 2019-05-14 DIAGNOSIS — G4730 Sleep apnea, unspecified: Secondary | ICD-10-CM | POA: Diagnosis present

## 2019-05-14 DIAGNOSIS — N179 Acute kidney failure, unspecified: Secondary | ICD-10-CM | POA: Diagnosis present

## 2019-05-14 DIAGNOSIS — Z20822 Contact with and (suspected) exposure to covid-19: Secondary | ICD-10-CM | POA: Diagnosis present

## 2019-05-14 DIAGNOSIS — Z825 Family history of asthma and other chronic lower respiratory diseases: Secondary | ICD-10-CM

## 2019-05-14 DIAGNOSIS — I252 Old myocardial infarction: Secondary | ICD-10-CM | POA: Diagnosis not present

## 2019-05-14 DIAGNOSIS — Z811 Family history of alcohol abuse and dependence: Secondary | ICD-10-CM

## 2019-05-14 DIAGNOSIS — R109 Unspecified abdominal pain: Secondary | ICD-10-CM | POA: Diagnosis present

## 2019-05-14 DIAGNOSIS — N10 Acute tubulo-interstitial nephritis: Secondary | ICD-10-CM | POA: Diagnosis present

## 2019-05-14 DIAGNOSIS — Z820 Family history of epilepsy and other diseases of the nervous system: Secondary | ICD-10-CM

## 2019-05-14 DIAGNOSIS — Z8673 Personal history of transient ischemic attack (TIA), and cerebral infarction without residual deficits: Secondary | ICD-10-CM

## 2019-05-14 DIAGNOSIS — N390 Urinary tract infection, site not specified: Secondary | ICD-10-CM | POA: Diagnosis present

## 2019-05-14 DIAGNOSIS — E7439 Other disorders of intestinal carbohydrate absorption: Secondary | ICD-10-CM | POA: Diagnosis present

## 2019-05-14 DIAGNOSIS — N12 Tubulo-interstitial nephritis, not specified as acute or chronic: Secondary | ICD-10-CM

## 2019-05-14 DIAGNOSIS — Z818 Family history of other mental and behavioral disorders: Secondary | ICD-10-CM

## 2019-05-14 DIAGNOSIS — M545 Low back pain, unspecified: Secondary | ICD-10-CM

## 2019-05-14 DIAGNOSIS — R7303 Prediabetes: Secondary | ICD-10-CM | POA: Diagnosis present

## 2019-05-14 LAB — PROTIME-INR
INR: 1.2 (ref 0.8–1.2)
Prothrombin Time: 15 seconds (ref 11.4–15.2)

## 2019-05-14 LAB — HEPATIC FUNCTION PANEL
ALT: 26 U/L (ref 0–44)
AST: 27 U/L (ref 15–41)
Albumin: 3.4 g/dL — ABNORMAL LOW (ref 3.5–5.0)
Alkaline Phosphatase: 73 U/L (ref 38–126)
Bilirubin, Direct: 0.2 mg/dL (ref 0.0–0.2)
Indirect Bilirubin: 0.7 mg/dL (ref 0.3–0.9)
Total Bilirubin: 0.9 mg/dL (ref 0.3–1.2)
Total Protein: 7 g/dL (ref 6.5–8.1)

## 2019-05-14 LAB — CBC
HCT: 39.6 % (ref 36.0–46.0)
HCT: 36.4 % (ref 36.0–46.0)
Hemoglobin: 13.4 g/dL (ref 12.0–15.0)
Hemoglobin: 11.9 g/dL — ABNORMAL LOW (ref 12.0–15.0)
MCH: 31.2 pg (ref 26.0–34.0)
MCH: 30.8 pg (ref 26.0–34.0)
MCHC: 33.8 g/dL (ref 30.0–36.0)
MCHC: 32.7 g/dL (ref 30.0–36.0)
MCV: 92.3 fL (ref 80.0–100.0)
MCV: 94.3 fL (ref 80.0–100.0)
Platelets: 195 10*3/uL (ref 150–400)
Platelets: 158 10*3/uL (ref 150–400)
RBC: 4.29 MIL/uL (ref 3.87–5.11)
RBC: 3.86 MIL/uL — ABNORMAL LOW (ref 3.87–5.11)
RDW: 12.1 % (ref 11.5–15.5)
RDW: 12.1 % (ref 11.5–15.5)
WBC: 14.4 10*3/uL — ABNORMAL HIGH (ref 4.0–10.5)
WBC: 11.4 10*3/uL — ABNORMAL HIGH (ref 4.0–10.5)
nRBC: 0 % (ref 0.0–0.2)
nRBC: 0 % (ref 0.0–0.2)

## 2019-05-14 LAB — BASIC METABOLIC PANEL
Anion gap: 13 (ref 5–15)
BUN: 17 mg/dL (ref 6–20)
CO2: 22 mmol/L (ref 22–32)
Calcium: 8.9 mg/dL (ref 8.9–10.3)
Chloride: 98 mmol/L (ref 98–111)
Creatinine, Ser: 2.02 mg/dL — ABNORMAL HIGH (ref 0.44–1.00)
GFR calc Af Amer: 31 mL/min — ABNORMAL LOW (ref >60)
GFR calc non Af Amer: 26 mL/min — ABNORMAL LOW (ref >60)
Glucose, Bld: 109 mg/dL — ABNORMAL HIGH (ref 70–99)
Potassium: 3.6 mmol/L (ref 3.5–5.1)
Sodium: 133 mmol/L — ABNORMAL LOW (ref 135–145)

## 2019-05-14 LAB — URINALYSIS, COMPLETE (UACMP) WITH MICROSCOPIC
Bilirubin Urine: NEGATIVE
Glucose, UA: NEGATIVE mg/dL
Hgb urine dipstick: NEGATIVE
Ketones, ur: 5 mg/dL — AB
Nitrite: NEGATIVE
Protein, ur: >=300 mg/dL — AB
Specific Gravity, Urine: 1.023 (ref 1.005–1.030)
WBC, UA: >50 WBC/hpf — ABNORMAL HIGH (ref 0–5)
pH: 5 (ref 5.0–8.0)

## 2019-05-14 LAB — LACTIC ACID, PLASMA
Lactic Acid, Venous: 1.4 mmol/L (ref 0.5–1.9)
Lactic Acid, Venous: 1.4 mmol/L (ref 0.5–1.9)

## 2019-05-14 LAB — CREATININE, SERUM
Creatinine, Ser: 1.8 mg/dL — ABNORMAL HIGH (ref 0.44–1.00)
GFR calc Af Amer: 35 mL/min — ABNORMAL LOW (ref >60)
GFR calc non Af Amer: 30 mL/min — ABNORMAL LOW (ref >60)

## 2019-05-14 LAB — TSH: TSH: 1 u[IU]/mL (ref 0.350–4.500)

## 2019-05-14 LAB — LIPASE, BLOOD: Lipase: 24 U/L (ref 11–51)

## 2019-05-14 LAB — APTT: aPTT: 37 seconds — ABNORMAL HIGH (ref 24–36)

## 2019-05-14 LAB — CK: Total CK: 217 U/L (ref 38–234)

## 2019-05-14 MED ORDER — SODIUM CHLORIDE 0.9 % IV BOLUS (SEPSIS)
1000.0000 mL | Freq: Once | INTRAVENOUS | Status: AC
  Administered 2019-05-14: 1000 mL via INTRAVENOUS

## 2019-05-14 MED ORDER — SODIUM CHLORIDE 0.9 % IV BOLUS (SEPSIS)
500.0000 mL | Freq: Once | INTRAVENOUS | Status: AC
  Administered 2019-05-14: 21:00:00 500 mL via INTRAVENOUS

## 2019-05-14 MED ORDER — FENTANYL CITRATE (PF) 100 MCG/2ML IJ SOLN
75.0000 ug | Freq: Once | INTRAMUSCULAR | Status: AC
  Administered 2019-05-14: 19:00:00 75 ug via INTRAVENOUS
  Filled 2019-05-14: qty 2

## 2019-05-14 MED ORDER — ACETAMINOPHEN 650 MG RE SUPP: 650.0000 mg | Freq: Four times a day (QID) | RECTAL | Status: DC | PRN

## 2019-05-14 MED ORDER — HEPARIN SODIUM (PORCINE) 5000 UNIT/ML IJ SOLN
5000.0000 [IU] | Freq: Three times a day (TID) | INTRAMUSCULAR | Status: DC
  Administered 2019-05-14 – 2019-05-17 (×8): 5000 [IU] via SUBCUTANEOUS
  Filled 2019-05-14 (×8): qty 1

## 2019-05-14 MED ORDER — ONDANSETRON HCL 4 MG/2ML IJ SOLN
4.0000 mg | Freq: Once | INTRAMUSCULAR | Status: AC
  Administered 2019-05-14: 4 mg via INTRAVENOUS
  Filled 2019-05-14: qty 2

## 2019-05-14 MED ORDER — ACETAMINOPHEN 325 MG PO TABS
650.0000 mg | ORAL_TABLET | Freq: Four times a day (QID) | ORAL | Status: DC | PRN
  Administered 2019-05-14 – 2019-05-16 (×2): 650 mg via ORAL
  Filled 2019-05-14 (×2): qty 2

## 2019-05-14 MED ORDER — SODIUM CHLORIDE 0.9 % IV SOLN
1.0000 g | Freq: Once | INTRAVENOUS | Status: AC
  Administered 2019-05-14: 19:00:00 1 g via INTRAVENOUS
  Filled 2019-05-14: qty 10

## 2019-05-14 MED ORDER — SODIUM CHLORIDE 0.9 % IV BOLUS
1000.0000 mL | Freq: Once | INTRAVENOUS | Status: AC
  Administered 2019-05-14: 1000 mL via INTRAVENOUS

## 2019-05-14 MED ORDER — SODIUM CHLORIDE 0.9 % IV SOLN
1.0000 g | INTRAVENOUS | Status: DC
  Filled 2019-05-14: qty 10

## 2019-05-14 MED ORDER — SENNOSIDES-DOCUSATE SODIUM 8.6-50 MG PO TABS: 1.0000 | ORAL_TABLET | Freq: Every evening | ORAL | Status: DC | PRN

## 2019-05-14 MED ORDER — SODIUM CHLORIDE 0.9 % IV SOLN: INTRAVENOUS | Status: DC

## 2019-05-14 MED ORDER — HYDROCODONE-ACETAMINOPHEN 5-325 MG PO TABS
1.0000 | ORAL_TABLET | Freq: Four times a day (QID) | ORAL | Status: DC | PRN
  Administered 2019-05-14 – 2019-05-17 (×6): 1 via ORAL
  Filled 2019-05-14 (×6): qty 1

## 2019-05-14 MED ORDER — ONDANSETRON HCL 4 MG/2ML IJ SOLN
4.0000 mg | Freq: Four times a day (QID) | INTRAMUSCULAR | Status: DC | PRN
  Administered 2019-05-17: 4 mg via INTRAVENOUS
  Filled 2019-05-14: qty 2

## 2019-05-14 MED ORDER — ONDANSETRON HCL 4 MG PO TABS: 4.0000 mg | ORAL_TABLET | Freq: Four times a day (QID) | ORAL | Status: DC | PRN

## 2019-05-14 NOTE — H&P (Signed)
History and Physical    Nicole Austin [id number] DOB: Jul 04, 1960 DOA: 05/14/2019  PCP: Delsa Grana, PA-C  Patient coming from: home  I have personally briefly reviewed patient's old medical records in [place]  Chief Complaint: flank pain fever and chills x4 days  HPI: Nicole Austin is a 59 y.o. female with medical history significant of COPD/asthma, tobacco abuse, hyperlipidemia , glucose intolerance, sleep apnea, GERD,tremors, and bipolar disorder with depression and anxiety flank pain fever and chills x4 days.  Patient also notes symptoms of urinary urgency and hesitancy. She also notes episode of emesis and since then has had poor appetite. Patient presents to day in referral from pcp after tele visit with concern for  pyelonephritis. On further ros, patient denies sob, chest pain, diarrhea, near syncope , or weakness.    ED Course: Tmx 100.7 , hr 102 bp 98/54  Sat 96 % on ra UA :+ Ct scan: findings concerning for left pyelonephritis   Review of Systems: As per HPI otherwise 10 point review of systems negative.   Past Medical History:  Diagnosis Date  . ADHD (attention deficit hyperactivity disorder)   . Apnea, sleep 06/30/2015  . Arthritis   . Asthma   . Asthma with acute exacerbation 06/30/2015  . Bipolar 1 disorder ([place])   . Chronic pain   . COPD (chronic obstructive pulmonary disease) ([place]) 11/2018   **LUNG SOUNDS WHEEZING AND WITH RALES**  . COPD, moderate ([place]) 11/18/2013   Overview:  Last Assessment & Plan:  Refer to pulmonologist Overview:  Overview:  Last Assessment & Plan:  Pneumonia vaccine offered and given today; she did not tolerate LABA; will start her on Spiriva; stop combivent neb; use only SABA neb OR inhaler, not both; smoking cessation urged, and I am here to help if she wants assistance quitting; f/u in 4 weeks Last Assessment & Plan:  Pneumonia vaccin  . Depression   . Dyspnea    with exertion  . GERD (gastroesophageal reflux  disease) 08/30/2015  . Gout 08/16/2014  . History of acute myocardial infarction 06/30/2015   Overview:  [facility]   . HPV (human papilloma virus) infection 07/17/2013  . Hyperlipidemia   . Low HDL (under 40) 08/30/2015  . MI (myocardial infarction) ([place]) 2018  . Parkinson disease ([place])   . Pre-diabetes   . Prediabetes 05/20/2016  . PTSD (post-traumatic stress disorder)    history of panic attacks  . PTSD (post-traumatic stress disorder)   . PTSD (post-traumatic stress disorder)   . Restless leg syndrome   . Rhabdomyolysis   . Sleep apnea 06/30/2015  . Stage 3 severe COPD by GOLD classification ([place]) 11/18/2013  . Stroke ([place]) 2018  . Tremors of nervous system    hands  . Uncomplicated asthma [id number]  . Varicella 02/25/2017    Past Surgical History:  Procedure Laterality Date  . BACK SURGERY    . CATARACT EXTRACTION W/PHACO Right 01/01/2019   Procedure: CATARACT EXTRACTION PHACO AND INTRAOCULAR LENS PLACEMENT (IOC) right vision blue;  Surgeon: Marchia Meiers, MD;  Location: [facility] ORS;  Service: Ophthalmology;  Laterality: Right;  [place] 00:39.4 CDE 5.49 Fluid Pack lot # [id number]  . CATARACT EXTRACTION W/PHACO Left 01/29/2019   Procedure: CATARACT EXTRACTION PHACO AND INTRAOCULAR LENS PLACEMENT ([place]) LEFT Vision Blue;  Surgeon: Marchia Meiers, MD;  Location: [facility] ORS;  Service: Ophthalmology;  Laterality: Left;  [place] 00:51.1 CDE 4.38 Fluid Pack Lot # [id number]  . COLONOSCOPY WITH PROPOFOL N/A 08/07/2017   Procedure: COLONOSCOPY  WITH PROPOFOL;  Surgeon: Virgel Manifold, MD;  Location: [facility] ENDOSCOPY;  Service: Endoscopy;  Laterality: N/A;  . EYE SURGERY    . JOINT REPLACEMENT Left 2016   tkr  . REPLACEMENT TOTAL KNEE Left 2016     reports that she has been smoking cigarettes. She has a 36.00 pack-year smoking history. She has never used smokeless tobacco. She reports that she does not drink alcohol or use drugs.  Allergies  Allergen Reactions  . Chantix [Varenicline] Nausea Only  . Codeine  Nausea And Vomiting    Family History  Problem Relation Age of Onset  . Hernia Mother   . Heart disease Mother   . OCD Mother   . Diabetes Mother   . Parkinson's disease Father   . Bipolar disorder Sister   . Schizophrenia Sister   . ADD / ADHD Sister   . Alcohol abuse Brother   . Bipolar disorder Sister   . Paranoid behavior Sister   . ADD / ADHD Sister   . ADD / ADHD Son   . Dementia Maternal Grandmother   . Emphysema Maternal Grandfather   . ADD / ADHD Son   . ADD / ADHD Son   . Depression Son     Prior to Admission medications   Medication Sig Start Date End Date Taking? Authorizing Provider  ALPRAZolam Duanne Moron) 0.5 MG tablet Take 0.5 mg by mouth 4 (four) times daily as needed for anxiety or sleep. And bedtime if neede    [provider]  amantadine (SYMMETREL) 100 MG capsule Take 1 capsule by mouth 2 (two) times daily. 03/11/19 05/14/19  [provider]  benztropine (COGENTIN) 1 MG tablet Take 1 mg by mouth at bedtime.    [provider]  divalproex (DEPAKOTE) 250 MG DR tablet Take 1 tablet (250 mg total) by mouth 2 (two) times daily. 11/19/16   Rainey Pines, MD  escitalopram (LEXAPRO) 20 MG tablet Take 1 tablet (20 mg total) by mouth daily. 11/19/16   Rainey Pines, MD  LATUDA 60 MG TABS Take 1 tablet by mouth at bedtime. 01/19/19   [provider]  Multiple Vitamin (MULTIVITAMIN WITH MINERALS) TABS tablet Take 1 tablet by mouth daily.    [provider]  rOPINIRole (REQUIP) 4 MG tablet Take 4 mg by mouth at bedtime.  09/25/18   [provider]  tiZANidine (ZANAFLEX) 2 MG tablet TAKE 1 TO 2 TABLETS BY MOUTH 3 TIMES DAILY AS NEEDED FOR MUSCLE SPASMS 04/08/19   Delsa Grana, PA-C  umeclidinium-vilanterol (ANORO ELLIPTA) 62.5-25 MCG/INH AEPB Inhale 1 puff into the lungs daily. 04/14/19   Delsa Grana, PA-C    Physical Exam: Vitals:   05/14/19 1548 05/14/19 1549 05/14/19 1829  BP: (!) 98/54  106/64  Pulse: (!) 102  94  Resp:  18  18  Temp: 98.2 F (36.8 C)  (!) 100.7 F (38.2 C)  TempSrc: Oral  Oral  SpO2: 96%  98%  Weight:  102.1 kg   Height:  5\' 1"  (1.549 m)     Constitutional: NAD, calm, comfortable Vitals:   05/14/19 1548 05/14/19 1549 05/14/19 1829  BP: (!) 98/54  106/64  Pulse: (!) 102  94  Resp: 18  18  Temp: 98.2 F (36.8 C)  (!) 100.7 F (38.2 C)  TempSrc: Oral  Oral  SpO2: 96%  98%  Weight:  102.1 kg   Height:  5\' 1"  (1.549 m)    Eyes: PERRL, lids and conjunctivae normal ENMT: Mucous membranes  are moist. Posterior pharynx clear of any exudate or lesions.Normal dentition.  Neck: normal, supple, no masses, no thyromegaly Respiratory: clear to auscultation bilaterally, no wheezing, no crackles. Normal respiratory effort. No accessory muscle use.  Cardiovascular: Regular rate and rhythm, no murmurs / rubs / gallops. No extremity edema. 2+ pedal pulses. No carotid bruits.  Abdomen: +tenderness left flank and suprapubic area, no masses palpated. No hepatosplenomegaly. Bowel sounds positive.  Musculoskeletal: no clubbing / cyanosis. No joint deformity upper and lower extremities. Good ROM, no contractures. Normal muscle tone.  Skin: no rashes, lesions, ulcers. No induration Neurologic: CN 2-12 grossly intact. Sensation intact, DTR normal. Strength 5/5 in all 4.  Psychiatric: Normal judgment and insight. Alert and oriented x 3. Normal mood.    Labs on Admission: I have personally reviewed following labs and imaging studies  CBC: Recent Labs  Lab 05/14/19 1553  WBC 14.4*  HGB 13.4  HCT 39.6  MCV 92.3  PLT 0000000   Basic Metabolic Panel: Recent Labs  Lab 05/14/19 1553  NA 133*  K 3.6  CL 98  CO2 22  GLUCOSE 109*  BUN 17  CREATININE 2.02*  CALCIUM 8.9   GFR: Estimated Creatinine Clearance: 32.9 mL/min (A) (by C-G formula based on SCr of 2.02 mg/dL (H)). Liver Function Tests: Recent Labs  Lab 05/14/19 1553  AST 27  ALT 26  ALKPHOS 73  BILITOT 0.9  PROT 7.0  ALBUMIN 3.4*    Recent Labs  Lab 05/14/19 1553  LIPASE 24   No results for input(s): AMMONIA in the last 168 hours. Coagulation Profile: No results for input(s): INR, PROTIME in the last 168 hours. Cardiac Enzymes: Recent Labs  Lab 05/14/19 1553  CKTOTAL 217   BNP (last 3 results) No results for input(s): PROBNP in the last 8760 hours. HbA1C: No results for input(s): HGBA1C in the last 72 hours. CBG: No results for input(s): GLUCAP in the last 168 hours. Lipid Profile: No results for input(s): CHOL, HDL, LDLCALC, TRIG, CHOLHDL, LDLDIRECT in the last 72 hours. Thyroid Function Tests: No results for input(s): TSH, T4TOTAL, FREET4, T3FREE, THYROIDAB in the last 72 hours. Anemia Panel: No results for input(s): VITAMINB12, FOLATE, FERRITIN, TIBC, IRON, RETICCTPCT in the last 72 hours. Urine analysis:    Component Value Date/Time   COLORURINE AMBER (A) 05/14/2019 1554   APPEARANCEUR CLOUDY (A) 05/14/2019 1554   APPEARANCEUR Clear 06/15/2014 0241   LABSPEC 1.023 05/14/2019 1554   LABSPEC 1.024 06/15/2014 0241   PHURINE 5.0 05/14/2019 1554   GLUCOSEU NEGATIVE 05/14/2019 1554   GLUCOSEU Negative 06/15/2014 0241   HGBUR NEGATIVE 05/14/2019 1554   BILIRUBINUR NEGATIVE 05/14/2019 1554   BILIRUBINUR Negative 06/15/2014 0241   KETONESUR 5 (A) 05/14/2019 1554   PROTEINUR >=300 (A) 05/14/2019 1554   NITRITE NEGATIVE 05/14/2019 1554   LEUKOCYTESUR LARGE (A) 05/14/2019 1554   LEUKOCYTESUR Negative 06/15/2014 0241    Radiological Exams on Admission: CT Renal Stone Study  Result Date: 05/14/2019 CLINICAL DATA:  Left flank pain for 3 days.  Decreased urine output. EXAM: CT ABDOMEN AND PELVIS WITHOUT CONTRAST TECHNIQUE: Multidetector CT imaging of the abdomen and pelvis was performed following the standard protocol without IV contrast. COMPARISON:  04/17/2014 FINDINGS: Lower chest: Clear lung bases.  Heart normal in size. Hepatobiliary: Liver normal in size. Decreased attenuation of the liver  diffusely consistent with fatty infiltration. 1 cm hypoattenuating lesion in the right lobe, stable consistent with a cyst. No other liver masses or lesions. Normal gallbladder. No bile duct dilation.  Pancreas: Unremarkable. No pancreatic ductal dilatation or surrounding inflammatory changes. Spleen: Normal in size without focal abnormality. Adrenals/Urinary Tract: No adrenal masses. There is left perinephric stranding. No hydronephrosis. No renal mass or stone. Normal left ureter. Normal right kidney. No right hydronephrosis. Normal right ureter. Bladder is unremarkable. Stomach/Bowel: Stomach is within normal limits. Appendix appears normal. No evidence of bowel wall thickening, distention, or inflammatory changes. Vascular/Lymphatic: Aortic atherosclerosis. No aneurysm. No enlarged lymph nodes. Reproductive: Uterus and bilateral adnexa are unremarkable. Other: No abdominal wall hernia or abnormality. No abdominopelvic ascites. Musculoskeletal: No fracture or acute finding. Chronic avascular necrosis of both femoral heads without subchondral collapse. No osteoblastic or osteolytic lesions. IMPRESSION: 1. Left perinephric stranding, which is new compared to the prior CT. There is no left hydronephrosis or hydroureter. No renal or ureteral stones. Findings may be due to left pyelonephritis if this correlates clinically. No evidence of an abscess. 2. No other acute abnormality within the abdomen or pelvis. 3. Hepatic steatosis. 4. Aortic atherosclerosis. Electronically Signed   By: Lajean Manes M.D.   On: 05/14/2019 18:28     Assessment/Plan Acute left sided  Pyelonephritis -ctx  -f/u on cultures  -supportive care with pain medications and antiemetics  COPD/asthma -no acute exacerbation  -continue controller medication   -prn nebs   tobacco abuse -encourage cessation  -patient does not want a nicotine patch    Hyperlipidemia   continue statin  Glucose intolerance Ada diet  Check finger  sticks  Sleep Apnea -cpap as able   GERD  -ppi   Bipolar disorder d/o/anxiety  Resume home medications  DVT prophylaxis:  Lwmh Code Status: FULL Family Communication: N/A Disposition Plan: 2-3 days  Consults called: n/a Admission status:inpatient  Clance Boll MD [facility]  If 7PM-7AM, please contact night-coverage [email or web address] Password [facility]  05/14/2019, 7:33 PM

## 2019-05-14 NOTE — Telephone Encounter (Signed)
Called patient, she is currently at the hospital. She did get a ride.

## 2019-05-14 NOTE — Progress Notes (Signed)
CODE SEPSIS - PHARMACY COMMUNICATION  **Broad Spectrum Antibiotics should be administered within 1 hour of Sepsis diagnosis**  Time Code Sepsis Called/Page Received: 03/18  1859  Antibiotics Ordered: Ceftriaxone  Time of 1st antibiotic administration: 1929  Additional action taken by pharmacy:    If necessary, Name of Provider/Nurse Contacted:      Noralee Space ,PharmD Clinical Pharmacist  05/14/2019  7:37 PM

## 2019-05-14 NOTE — ED Notes (Signed)
Pt mildly hypotensive 9in triage. Pt states she has a hx of low BP at times. Pt denies taking any meds PTA.

## 2019-05-14 NOTE — ED Provider Notes (Signed)
[facility] Emergency Department Provider Note  ____________________________________________   First MD Initiated Contact with Patient 05/14/19 1744     (approximate)  I have reviewed the triage vital signs and the nursing notes.   HISTORY  Chief Complaint Flank Pain    HPI Nicole Austin is a 59 y.o. female with bipolar, chronic chronic pain, prior rhabdo who comes in with left flank pain.  Patient states that she started to have flank pain 4 days ago.  The pain is in her left flank, radiates into her groin, constant, not better with Tylenol.  Last took Tylenol just prior to arrival.  Denies ever having pain like this before, nothing seems to make it worse.  Denies history of kidney stones.  Denies any chest pain or shortness of breath.  It has been associated with some difficulty with starting her stream.          Past Medical History:  Diagnosis Date  . ADHD (attention deficit hyperactivity disorder)   . Apnea, sleep 06/30/2015  . Arthritis   . Asthma   . Asthma with acute exacerbation 06/30/2015  . Bipolar 1 disorder ([place])   . Chronic pain   . COPD (chronic obstructive pulmonary disease) ([place]) 11/2018   **LUNG SOUNDS WHEEZING AND WITH RALES**  . COPD, moderate ([place]) 11/18/2013   Overview:  Last Assessment & Plan:  Refer to pulmonologist Overview:  Overview:  Last Assessment & Plan:  Pneumonia vaccine offered and given today; she did not tolerate LABA; will start her on Spiriva; stop combivent neb; use only SABA neb OR inhaler, not both; smoking cessation urged, and I am here to help if she wants assistance quitting; f/u in 4 weeks Last Assessment & Plan:  Pneumonia vaccin  . Depression   . Dyspnea    with exertion  . GERD (gastroesophageal reflux disease) 08/30/2015  . Gout 08/16/2014  . History of acute myocardial infarction 06/30/2015   Overview:  [facility]   . HPV (human papilloma virus) infection 07/17/2013  . Hyperlipidemia   . Low HDL (under  40) 08/30/2015  . MI (myocardial infarction) ([place]) 2018  . Parkinson disease ([place])   . Pre-diabetes   . Prediabetes 05/20/2016  . PTSD (post-traumatic stress disorder)    history of panic attacks  . PTSD (post-traumatic stress disorder)   . PTSD (post-traumatic stress disorder)   . Restless leg syndrome   . Rhabdomyolysis   . Sleep apnea 06/30/2015  . Stage 3 severe COPD by GOLD classification ([place]) 11/18/2013  . Stroke ([place]) 2018  . Tremors of nervous system    hands  . Uncomplicated asthma [id number]  . Varicella 02/25/2017    Patient Active Problem List   Diagnosis Date Noted  . Benign neoplasm of ascending colon   . Polyp of sigmoid colon   . Bipolar disorder with depression ([place]) 02/25/2017  . ADD (attention deficit disorder) 02/25/2017  . Marijuana use 02/13/2017  . Neuropathy, peripheral 05/07/2016  . Vitamin B12 deficiency 10/24/2015  . Fibromyalgia 08/30/2015  . Low HDL (under 40) 08/30/2015  . GERD (gastroesophageal reflux disease) 08/30/2015  . OP (osteoporosis) 06/30/2015  . Sleep apnea 06/30/2015  . Morbid (severe) obesity due to excess calories ([place]) 08/16/2014  . Low back pain with sciatica 08/04/2014  . Current tobacco use 08/04/2014  . Polysubstance abuse ([place]) 07/04/2014  . Psychoactive substance abuse ([place]) 07/04/2014  . Anxiety, generalized 11/24/2013  . COPD with asthma ([place]) 11/18/2013  . Arthritis of knee, degenerative 07/15/2013  Past Surgical History:  Procedure Laterality Date  . BACK SURGERY    . CATARACT EXTRACTION W/PHACO Right 01/01/2019   Procedure: CATARACT EXTRACTION PHACO AND INTRAOCULAR LENS PLACEMENT (IOC) right vision blue;  Surgeon: Marchia Meiers, MD;  Location: [facility] ORS;  Service: Ophthalmology;  Laterality: Right;  [place] 00:39.4 CDE 5.49 Fluid Pack lot # [id number]  . CATARACT EXTRACTION W/PHACO Left 01/29/2019   Procedure: CATARACT EXTRACTION PHACO AND INTRAOCULAR LENS PLACEMENT ([place]) LEFT Vision Blue;  Surgeon: Marchia Meiers, MD;   Location: [facility] ORS;  Service: Ophthalmology;  Laterality: Left;  [place] 00:51.1 CDE 4.38 Fluid Pack Lot # [id number]  . COLONOSCOPY WITH PROPOFOL N/A 08/07/2017   Procedure: COLONOSCOPY WITH PROPOFOL;  Surgeon: Virgel Manifold, MD;  Location: [facility] ENDOSCOPY;  Service: Endoscopy;  Laterality: N/A;  . EYE SURGERY    . JOINT REPLACEMENT Left 2016   tkr  . REPLACEMENT TOTAL KNEE Left 2016    Prior to Admission medications   Medication Sig Start Date End Date Taking? Authorizing Provider  ALPRAZolam Duanne Moron) 0.5 MG tablet Take 0.5 mg by mouth 4 (four) times daily as needed for anxiety or sleep. And bedtime if neede    [provider]  amantadine (SYMMETREL) 100 MG capsule Take 1 capsule by mouth 2 (two) times daily. 03/11/19 05/14/19  [provider]  benztropine (COGENTIN) 1 MG tablet Take 1 mg by mouth at bedtime.    [provider]  divalproex (DEPAKOTE) 250 MG DR tablet Take 1 tablet (250 mg total) by mouth 2 (two) times daily. 11/19/16   Rainey Pines, MD  escitalopram (LEXAPRO) 20 MG tablet Take 1 tablet (20 mg total) by mouth daily. 11/19/16   Rainey Pines, MD  LATUDA 60 MG TABS Take 1 tablet by mouth at bedtime. 01/19/19   [provider]  Multiple Vitamin (MULTIVITAMIN WITH MINERALS) TABS tablet Take 1 tablet by mouth daily.    [provider]  rOPINIRole (REQUIP) 4 MG tablet Take 4 mg by mouth at bedtime.  09/25/18   [provider]  tiZANidine (ZANAFLEX) 2 MG tablet TAKE 1 TO 2 TABLETS BY MOUTH 3 TIMES DAILY AS NEEDED FOR MUSCLE SPASMS 04/08/19   Delsa Grana, PA-C  umeclidinium-vilanterol (ANORO ELLIPTA) 62.5-25 MCG/INH AEPB Inhale 1 puff into the lungs daily. 04/14/19   Delsa Grana, PA-C    Allergies Chantix [varenicline] and Codeine  Family History  Problem Relation Age of Onset  . Hernia Mother   . Heart disease Mother   . OCD Mother   . Diabetes Mother   . Parkinson's disease Father   . Bipolar disorder Sister   .  Schizophrenia Sister   . ADD / ADHD Sister   . Alcohol abuse Brother   . Bipolar disorder Sister   . Paranoid behavior Sister   . ADD / ADHD Sister   . ADD / ADHD Son   . Dementia Maternal Grandmother   . Emphysema Maternal Grandfather   . ADD / ADHD Son   . ADD / ADHD Son   . Depression Son     Social History Social History   Tobacco Use  . Smoking status: Current Every Day Smoker    Packs/day: 1.00    Years: 36.00    Pack years: 36.00    Types: Cigarettes  . Smokeless tobacco: Never Used  Substance Use Topics  . Alcohol use: No    Alcohol/week: 0.0 standard drinks  . Drug use: No      Review of Systems Constitutional: No  fever/chills Eyes: No visual changes. ENT: No sore throat. Cardiovascular: Denies chest pain. Respiratory: Denies shortness of breath. Gastrointestinal: No abdominal pain.  No nausea, no vomiting.  No diarrhea.  No constipation. Genitourinary: Difficulty with starting her stream Musculoskeletal: Left flank tenderness, Skin: Negative for rash. Neurological: Negative for headaches, focal weakness or numbness. All other ROS negative ____________________________________________   PHYSICAL EXAM:  VITAL SIGNS: ED Triage Vitals  Enc Vitals Group     BP 05/14/19 1548 (!) 98/54     Pulse Rate 05/14/19 1548 (!) 102     Resp 05/14/19 1548 18     Temp 05/14/19 1548 98.2 F (36.8 C)     Temp Source 05/14/19 1548 Oral     SpO2 05/14/19 1548 96 %     Weight 05/14/19 1549 225 lb (102.1 kg)     Height 05/14/19 1549 5\' 1"  (1.549 m)     Head Circumference --      Peak Flow --      Pain Score 05/14/19 1549 10     Pain Loc --      Pain Edu? --      Excl. in [place]? --     Constitutional: Alert and oriented. Well appearing and in no acute distress but does appear to be uncomfortable Eyes: Conjunctivae are normal. EOMI. Head: Atraumatic. Nose: No congestion/rhinnorhea. Mouth/Throat: Mucous membranes are moist.   Neck: No stridor. Trachea Midline.  FROM Cardiovascular: Tachycardic, regular rhythm. Grossly normal heart sounds.  Good peripheral circulation. Respiratory: Normal respiratory effort.  No retractions. Lungs CTAB. Gastrointestinal: Soft and nontender. No distention. No abdominal bruits.  Musculoskeletal: No lower extremity tenderness nor edema.  No joint effusions. Neurologic:  Normal speech and language. No gross focal neurologic deficits are appreciated.  Skin:  Skin is warm, dry and intact. No rash noted. Psychiatric: Mood and affect are normal. Speech and behavior are normal. GU: Deferred  Positive CVA tenderness on the left ____________________________________________   LABS (all labs ordered are listed, but only abnormal results are displayed)  Labs Reviewed  URINALYSIS, COMPLETE (UACMP) WITH MICROSCOPIC - Abnormal; Notable for the following components:      Result Value   Color, Urine AMBER (*)    APPearance CLOUDY (*)    Ketones, ur 5 (*)    Protein, ur >=300 (*)    Leukocytes,Ua LARGE (*)    WBC, UA >50 (*)    Bacteria, UA MANY (*)    All other components within normal limits  BASIC METABOLIC PANEL - Abnormal; Notable for the following components:   Sodium 133 (*)    Glucose, Bld 109 (*)    Creatinine, Ser 2.02 (*)    GFR calc non Af Amer 26 (*)    GFR calc Af Amer 31 (*)    All other components within normal limits  CBC - Abnormal; Notable for the following components:   WBC 14.4 (*)    All other components within normal limits  HEPATIC FUNCTION PANEL - Abnormal; Notable for the following components:   Albumin 3.4 (*)    All other components within normal limits  CULTURE, BLOOD (ROUTINE X 2)  CULTURE, BLOOD (ROUTINE X 2)  URINE CULTURE  LIPASE, BLOOD  CK  LACTIC ACID, PLASMA  LACTIC ACID, PLASMA   ____________________________________________   RADIOLOGY   Official radiology report(s): CT Renal Stone Study  Result Date: 05/14/2019 CLINICAL DATA:  Left flank pain for 3 days.  Decreased  urine output. EXAM: CT ABDOMEN AND PELVIS WITHOUT CONTRAST  TECHNIQUE: Multidetector CT imaging of the abdomen and pelvis was performed following the standard protocol without IV contrast. COMPARISON:  04/17/2014 FINDINGS: Lower chest: Clear lung bases.  Heart normal in size. Hepatobiliary: Liver normal in size. Decreased attenuation of the liver diffusely consistent with fatty infiltration. 1 cm hypoattenuating lesion in the right lobe, stable consistent with a cyst. No other liver masses or lesions. Normal gallbladder. No bile duct dilation. Pancreas: Unremarkable. No pancreatic ductal dilatation or surrounding inflammatory changes. Spleen: Normal in size without focal abnormality. Adrenals/Urinary Tract: No adrenal masses. There is left perinephric stranding. No hydronephrosis. No renal mass or stone. Normal left ureter. Normal right kidney. No right hydronephrosis. Normal right ureter. Bladder is unremarkable. Stomach/Bowel: Stomach is within normal limits. Appendix appears normal. No evidence of bowel wall thickening, distention, or inflammatory changes. Vascular/Lymphatic: Aortic atherosclerosis. No aneurysm. No enlarged lymph nodes. Reproductive: Uterus and bilateral adnexa are unremarkable. Other: No abdominal wall hernia or abnormality. No abdominopelvic ascites. Musculoskeletal: No fracture or acute finding. Chronic avascular necrosis of both femoral heads without subchondral collapse. No osteoblastic or osteolytic lesions. IMPRESSION: 1. Left perinephric stranding, which is new compared to the prior CT. There is no left hydronephrosis or hydroureter. No renal or ureteral stones. Findings may be due to left pyelonephritis if this correlates clinically. No evidence of an abscess. 2. No other acute abnormality within the abdomen or pelvis. 3. Hepatic steatosis. 4. Aortic atherosclerosis. Electronically Signed   By: Lajean Manes M.D.   On: 05/14/2019 18:28     ____________________________________________   PROCEDURES  Procedure(s) performed (including Critical Care):  .Critical Care Performed by: Vanessa Belding, MD Authorized by: Vanessa Baden, MD   Critical care provider statement:    Critical care time (minutes):  45   Critical care was necessary to treat or prevent imminent or life-threatening deterioration of the following conditions:  Sepsis   Critical care was time spent personally by me on the following activities:  Discussions with consultants, evaluation of patient's response to treatment, examination of patient, ordering and performing treatments and interventions, ordering and review of laboratory studies, ordering and review of radiographic studies, pulse oximetry, re-evaluation of patient's condition, obtaining history from patient or surrogate and review of old charts     ____________________________________________   INITIAL IMPRESSION / ASSESSMENT AND PLAN / ED COURSE  Nicole Austin was evaluated in Emergency Department on 05/14/2019 for the symptoms described in the history of present illness. She was evaluated in the context of the global COVID-19 pandemic, which necessitated consideration that the patient might be at risk for infection with the SARS-CoV-2 virus that causes COVID-19. Institutional protocols and algorithms that pertain to the evaluation of patients at risk for COVID-19 are in a state of rapid change based on information released by regulatory bodies including the CDC and federal and state organizations. These policies and algorithms were followed during the patient's care in the ED.    Patient is a 59 year old who comes in with left flank pain radiating to the groin.  And difficulty with starting her stream.  Will get urine to evaluate for UTI.  Will get CT scan to evaluate for [place] versus kidney stone.  Will get labs to evaluate for electrolyte abnormalities, AKI  Creatinine is elevated  2.02.  Patient spiked a fever of 38.2 and has a white count of 14 this meet sirs criteria.  We will give patient fluid resuscitation and start on ceftriaxone.  CT scan concerning for pyelonephritis no evidence of  kidney stone  We will discussed with the hospital team for admission  I discussed the provisional nature of ED diagnosis, the treatment so far, the ongoing plan of care, follow up appointments and return precautions with the patient and any family or support people present. They expressed understanding and agreed with the plan, discharged home.          ____________________________________________   FINAL CLINICAL IMPRESSION(S) / ED DIAGNOSES   Final diagnoses:  Sepsis, due to unspecified organism, unspecified whether acute organ dysfunction present ([place])  Pyelonephritis      MEDICATIONS GIVEN DURING THIS VISIT:  Medications  cefTRIAXone (ROCEPHIN) 1 g in sodium chloride 0.9 % 100 mL IVPB (has no administration in time range)  sodium chloride 0.9 % bolus 1,000 mL (has no administration in time range)    And  sodium chloride 0.9 % bolus 1,000 mL (has no administration in time range)    And  sodium chloride 0.9 % bolus 500 mL (has no administration in time range)  sodium chloride 0.9 % bolus 1,000 mL (1,000 mLs Intravenous New Bag/Given 05/14/19 1848)  fentaNYL (SUBLIMAZE) injection 75 mcg (75 mcg Intravenous Given 05/14/19 1849)  ondansetron (ZOFRAN) injection 4 mg (4 mg Intravenous Given 05/14/19 1849)     ED Discharge Orders    None       Note:  This document was prepared using Dragon voice recognition software and may include unintentional dictation errors.   Vanessa Harris, MD 05/14/19 [phone]

## 2019-05-14 NOTE — ED Triage Notes (Signed)
Pt to the ER via [facility] for flank pain x 3 days and decreased urine output. Pt denies n/v/d. Pt from home. No falls. Vitals with ems 110/70, HR 100, 95%, 98.2 temp.

## 2019-05-14 NOTE — Progress Notes (Signed)
Name: Nicole Austin   MRN: [id number]    DOB: 1960/09/02   Date:05/14/2019       Progress Note  Subjective  Chief Complaint  Chief Complaint  Patient presents with  . Excessive Sweating  . Chills  . Back Pain  . Urinary Urgency    has the urge to go but small amount when she tries to urinate    I connected with  Chiara Lamons Gottlieb  on 05/14/19 at  2:00 PM EDT by a video enabled telemedicine application and verified that I am speaking with the correct person using two identifiers.  I discussed the limitations of evaluation and management by telemedicine and the availability of in person appointments. The patient expressed understanding and agreed to proceed. Staff also discussed with the patient that there may be a patient responsible charge related to this service. Patient Location: Home Provider Location: [facility] Additional Individuals present: none  HPI  Patient is a 59 year old female patient of Delsa Grana, with a history of COPD/asthma and takes an oral daily and has an albuterol inhaler to use as needed (is a smoker), hyperlipidemia on a statin, GERD, lower extremity edema, tremors, and bipolar disorder with depression and anxiety who follows up today with the above chief complaint.   Since Monday, started with lower back on left side, pain now significantly increased, 10/10 pain, freezing with chills noted, sweating, threw up day before yesterday, can't eat since or drink as hurts to do so, drinking cranberry grape juice last few days, not drink much water.  She notes when she tries to urinate, just a very little comes out and it is painful.  She feels the pain in her low back.  Is not significantly burning when she urinates, denies frequency, no blood with the urine.  She has not had a bowel movement since Monday.  She denies feeling constipated or marked abdominal pains.  She was lying reclined, looked very uncomfortable, and was tearful in answering questions and noting  she is in so much pain she "just cannot take it anymore "  Lives alone with cat, she notes her sister has an appointment this afternoon, and a neighbor who has a car noted it will start.   Patient Active Problem List   Diagnosis Date Noted  . Benign neoplasm of ascending colon   . Polyp of sigmoid colon   . Bipolar disorder with depression ([place]) 02/25/2017  . ADD (attention deficit disorder) 02/25/2017  . Marijuana use 02/13/2017  . Neuropathy, peripheral 05/07/2016  . Vitamin B12 deficiency 10/24/2015  . Fibromyalgia 08/30/2015  . Low HDL (under 40) 08/30/2015  . GERD (gastroesophageal reflux disease) 08/30/2015  . OP (osteoporosis) 06/30/2015  . Sleep apnea 06/30/2015  . Morbid (severe) obesity due to excess calories ([place]) 08/16/2014  . Low back pain with sciatica 08/04/2014  . Current tobacco use 08/04/2014  . Polysubstance abuse ([place]) 07/04/2014  . Psychoactive substance abuse ([place]) 07/04/2014  . Anxiety, generalized 11/24/2013  . COPD with asthma ([place]) 11/18/2013  . Arthritis of knee, degenerative 07/15/2013    Past Surgical History:  Procedure Laterality Date  . BACK SURGERY    . CATARACT EXTRACTION W/PHACO Right 01/01/2019   Procedure: CATARACT EXTRACTION PHACO AND INTRAOCULAR LENS PLACEMENT (IOC) right vision blue;  Surgeon: Marchia Meiers, MD;  Location: [facility] ORS;  Service: Ophthalmology;  Laterality: Right;  [place] 00:39.4 CDE 5.49 Fluid Pack lot # [id number]  . CATARACT EXTRACTION W/PHACO Left 01/29/2019   Procedure: CATARACT EXTRACTION PHACO  AND INTRAOCULAR LENS PLACEMENT (IOC) LEFT Vision Blue;  Surgeon: Marchia Meiers, MD;  Location: [facility] ORS;  Service: Ophthalmology;  Laterality: Left;  [place] 00:51.1 CDE 4.38 Fluid Pack Lot # [id number]  . COLONOSCOPY WITH PROPOFOL N/A 08/07/2017   Procedure: COLONOSCOPY WITH PROPOFOL;  Surgeon: Virgel Manifold, MD;  Location: [facility] ENDOSCOPY;  Service: Endoscopy;  Laterality: N/A;  . JOINT REPLACEMENT Left 2016   tkr  . REPLACEMENT  TOTAL KNEE Left 2016    Family History  Problem Relation Age of Onset  . Hernia Mother   . Heart disease Mother   . OCD Mother   . Diabetes Mother   . Parkinson's disease Father   . Bipolar disorder Sister   . Schizophrenia Sister   . ADD / ADHD Sister   . Alcohol abuse Brother   . Bipolar disorder Sister   . Paranoid behavior Sister   . ADD / ADHD Sister   . ADD / ADHD Son   . Dementia Maternal Grandmother   . Emphysema Maternal Grandfather   . ADD / ADHD Son   . ADD / ADHD Son   . Depression Son     Social History   Tobacco Use  . Smoking status: Current Every Day Smoker    Packs/day: 1.00    Years: 36.00    Pack years: 36.00    Types: Cigarettes  . Smokeless tobacco: Never Used  Substance Use Topics  . Alcohol use: No    Alcohol/week: 0.0 standard drinks     Current Outpatient Medications:  .  ALPRAZolam (XANAX) 0.5 MG tablet, Take 0.5 mg by mouth 4 (four) times daily as needed for anxiety or sleep. And bedtime if neede, Disp: , Rfl:  .  amantadine (SYMMETREL) 100 MG capsule, Take 1 capsule by mouth 2 (two) times daily., Disp: , Rfl:  .  benztropine (COGENTIN) 1 MG tablet, Take 1 mg by mouth at bedtime., Disp: , Rfl:  .  divalproex (DEPAKOTE) 250 MG DR tablet, Take 1 tablet (250 mg total) by mouth 2 (two) times daily., Disp: 60 tablet, Rfl: 2 .  escitalopram (LEXAPRO) 20 MG tablet, Take 1 tablet (20 mg total) by mouth daily., Disp: 30 tablet, Rfl: 2 .  LATUDA 60 MG TABS, Take 1 tablet by mouth at bedtime., Disp: , Rfl:  .  Multiple Vitamin (MULTIVITAMIN WITH MINERALS) TABS tablet, Take 1 tablet by mouth daily., Disp: , Rfl:  .  rOPINIRole (REQUIP) 4 MG tablet, Take 4 mg by mouth at bedtime. , Disp: , Rfl:  .  tiZANidine (ZANAFLEX) 2 MG tablet, TAKE 1 TO 2 TABLETS BY MOUTH 3 TIMES DAILY AS NEEDED FOR MUSCLE SPASMS, Disp: 180 tablet, Rfl: 1 .  umeclidinium-vilanterol (ANORO ELLIPTA) 62.5-25 MCG/INH AEPB, Inhale 1 puff into the lungs daily., Disp: 30 each, Rfl: 11   Allergies  Allergen Reactions  . Chantix [Varenicline] Nausea Only  . Codeine Nausea And Vomiting    With staff assistance, above reviewed with the patient today.   ROS: As per HPI, otherwise no specific complaints on a limited and focused system review   Objective  Virtual encounter, vitals not obtained.  Body mass index is 42.51 kg/m.  Physical Exam  Patient appears ill, lying reclined and tearful in conversation HENT: She did not open her eyes often in the video chat Breathing: Effort normal. No respiratory distress. Speaking in complete sentences Neurological: Pt is alert  Psychiatric: Patient was tearful and in pain,  appropriate with conversation, judgment and thought content  normal.   No results found for this or any previous visit (from the past 72 hour(s)).  PHQ2/9: Depression screen [facility] 2/9 05/14/2019 03/17/2019 12/23/2018 12/03/2018 10/14/2018  Decreased Interest 1 3 1 1  0  Down, Depressed, Hopeless 1 3 1 3  0  PHQ - 2 Score 2 6 2 4  0  Altered sleeping 1 3 1 3  0  Tired, decreased energy 1 3 2 3  0  Change in appetite 3 0 2 3 0  Feeling bad or failure about yourself  0 0 1 3 0  Trouble concentrating 1 3 3 3  0  Moving slowly or fidgety/restless 0 1 0 0 0  Suicidal thoughts 0 0 0 0 0  PHQ-9 Score 8 16 11 19  0  Difficult doing work/chores Not difficult at all Not difficult at all - Very difficult Not difficult at all  Some recent data might be hidden   PHQ-2/9 Result reviewed, on meds to manage  Fall Risk: Fall Risk  05/14/2019 03/17/2019 12/03/2018 10/14/2018 05/02/2018  Falls in the past year? 0 1 0 0 1  Comment - - - - -  Number falls in past yr: 0 1 0 0 1  Comment - slipped on steps - - -  Injury with Fall? 0 0 0 0 0  Comment - - - - -  Risk Factor Category  - - - - -  Risk for fall due to : - History of fall(s) - - -  Follow up - Falls prevention discussed - - -  Comment - - - - -     Assessment & Plan 1. Acute left-sided low back pain without sciatica  Concerned with this patient's history and appearance for a possible pyelonephritis.  She is ill-appearing, very uncomfortable, and has had poor p.o. intake in the last couple days.  She lives alone presently.  I advised her that she needs to be seen emergently in an emergency room setting and if she cannot get a ride to the ER should very soon, an ambulance needs to be called to transport her.  She stated she is waiting to hear from her sister, expects to within the hour, and if so hopes to be able to get a ride from her.  Emphasized if she does not hear from her sister or is unable to get a ride to the emergency room in that timeframe, she should call an ambulance, and she was understanding of that. I will have a follow-up call in approximately 60 minutes with Melissa's help to reassess her status  2. Chills As above  3. Urinary urgency As above    I discussed the assessment and treatment plan with the patient. The patient was provided an opportunity to ask questions and all were answered. The patient agreed with the plan and demonstrated an understanding of the instructions.  I provided 15 minutes of non-face-to-face time during this encounter that included discussing at length patient's sx/history, pertinent pmhx, medications, treatment and follow up plan. This time also included the necessary documentation, orders, and chart review.

## 2019-05-15 ENCOUNTER — Encounter: Payer: Self-pay | Admitting: Internal Medicine

## 2019-05-15 DIAGNOSIS — N10 Acute tubulo-interstitial nephritis: Secondary | ICD-10-CM | POA: Diagnosis present

## 2019-05-15 DIAGNOSIS — N179 Acute kidney failure, unspecified: Secondary | ICD-10-CM

## 2019-05-15 LAB — BASIC METABOLIC PANEL
Anion gap: 9 (ref 5–15)
BUN: 15 mg/dL (ref 6–20)
CO2: 23 mmol/L (ref 22–32)
Calcium: 7.7 mg/dL — ABNORMAL LOW (ref 8.9–10.3)
Chloride: 104 mmol/L (ref 98–111)
Creatinine, Ser: 1.38 mg/dL — ABNORMAL HIGH (ref 0.44–1.00)
GFR calc Af Amer: 48 mL/min — ABNORMAL LOW (ref >60)
GFR calc non Af Amer: 42 mL/min — ABNORMAL LOW (ref >60)
Glucose, Bld: 107 mg/dL — ABNORMAL HIGH (ref 70–99)
Potassium: 3.9 mmol/L (ref 3.5–5.1)
Sodium: 136 mmol/L (ref 135–145)

## 2019-05-15 LAB — CBC
HCT: 32.5 % — ABNORMAL LOW (ref 36.0–46.0)
Hemoglobin: 11 g/dL — ABNORMAL LOW (ref 12.0–15.0)
MCH: 31.3 pg (ref 26.0–34.0)
MCHC: 33.8 g/dL (ref 30.0–36.0)
MCV: 92.3 fL (ref 80.0–100.0)
Platelets: 143 10*3/uL — ABNORMAL LOW (ref 150–400)
RBC: 3.52 MIL/uL — ABNORMAL LOW (ref 3.87–5.11)
RDW: 12.3 % (ref 11.5–15.5)
WBC: 9.8 10*3/uL (ref 4.0–10.5)
nRBC: 0 % (ref 0.0–0.2)

## 2019-05-15 LAB — HIV ANTIBODY (ROUTINE TESTING W REFLEX): HIV Screen 4th Generation wRfx: NONREACTIVE

## 2019-05-15 LAB — HEMOGLOBIN A1C
Hgb A1c MFr Bld: 5.6 % (ref 4.8–5.6)
Mean Plasma Glucose: 114.02 mg/dL

## 2019-05-15 LAB — SARS CORONAVIRUS 2 (TAT 6-24 HRS): SARS Coronavirus 2: NEGATIVE

## 2019-05-15 MED ORDER — DIVALPROEX SODIUM 250 MG PO DR TAB
250.0000 mg | DELAYED_RELEASE_TABLET | Freq: Two times a day (BID) | ORAL | Status: DC
  Administered 2019-05-15 – 2019-05-17 (×6): 250 mg via ORAL
  Filled 2019-05-15 (×7): qty 1

## 2019-05-15 MED ORDER — BENZTROPINE MESYLATE 1 MG PO TABS
1.0000 mg | ORAL_TABLET | Freq: Every day | ORAL | Status: DC
  Administered 2019-05-15 – 2019-05-16 (×2): 1 mg via ORAL
  Filled 2019-05-15 (×3): qty 1

## 2019-05-15 MED ORDER — ESCITALOPRAM OXALATE 10 MG PO TABS
20.0000 mg | ORAL_TABLET | Freq: Every day | ORAL | Status: DC
  Administered 2019-05-15 – 2019-05-17 (×3): 20 mg via ORAL
  Filled 2019-05-15 (×3): qty 2

## 2019-05-15 MED ORDER — MORPHINE SULFATE (PF) 2 MG/ML IV SOLN
2.0000 mg | INTRAVENOUS | Status: DC | PRN
  Administered 2019-05-15 – 2019-05-17 (×7): 2 mg via INTRAVENOUS
  Filled 2019-05-15 (×7): qty 1

## 2019-05-15 MED ORDER — ADULT MULTIVITAMIN W/MINERALS CH
1.0000 | ORAL_TABLET | Freq: Every day | ORAL | Status: DC
  Administered 2019-05-15 – 2019-05-17 (×3): 1 via ORAL
  Filled 2019-05-15 (×3): qty 1

## 2019-05-15 MED ORDER — LURASIDONE HCL 40 MG PO TABS
60.0000 mg | ORAL_TABLET | Freq: Every day | ORAL | Status: DC
  Administered 2019-05-15 – 2019-05-16 (×2): 60 mg via ORAL
  Filled 2019-05-15 (×3): qty 2

## 2019-05-15 MED ORDER — ALPRAZOLAM 0.5 MG PO TABS: 0.5000 mg | ORAL_TABLET | Freq: Four times a day (QID) | ORAL | Status: DC

## 2019-05-15 MED ORDER — ROPINIROLE HCL 1 MG PO TABS
4.0000 mg | ORAL_TABLET | Freq: Every day | ORAL | Status: DC
  Administered 2019-05-15 – 2019-05-16 (×2): 4 mg via ORAL
  Filled 2019-05-15 (×3): qty 4

## 2019-05-15 MED ORDER — UMECLIDINIUM-VILANTEROL 62.5-25 MCG/INH IN AEPB
1.0000 | INHALATION_SPRAY | Freq: Every day | RESPIRATORY_TRACT | Status: DC
  Administered 2019-05-15: 1 via RESPIRATORY_TRACT
  Filled 2019-05-15: qty 14

## 2019-05-15 MED ORDER — ALPRAZOLAM 0.5 MG PO TABS
0.5000 mg | ORAL_TABLET | Freq: Four times a day (QID) | ORAL | Status: DC | PRN
  Administered 2019-05-16 – 2019-05-17 (×2): 0.5 mg via ORAL
  Filled 2019-05-15 (×2): qty 1

## 2019-05-15 MED ORDER — SODIUM CHLORIDE 0.9 % IV SOLN
2.0000 g | INTRAVENOUS | Status: DC
  Administered 2019-05-15 – 2019-05-16 (×2): 2 g via INTRAVENOUS
  Filled 2019-05-15: qty 20
  Filled 2019-05-15 (×2): qty 2

## 2019-05-15 NOTE — Progress Notes (Addendum)
Progress Note    Angelynne Thacher Hessler  [id number] DOB: 1960-05-02  DOA: 05/14/2019 PCP: Danelle Berry, PA-C      Brief Narrative:    Medical records reviewed and are as summarized below:  Zailyn Mintz is an 59 y.o. female with medical history significant of COPD/asthma, tobacco abuse,hyperlipidemia , glucose intolerance, sleep apnea, GERD,Parkinson's disease, RLS, tremors, and bipolar disorder with depression and anxiety flank pain fever and chills for about 4 days prior to admission.  She also had urinary urgency and hesitancy.  He had poor appetite and vomited once.  She presented to the emergency room at the behest of her primary care physician after she was evaluated by PCP via tele consult.     Assessment/Plan:   Principal Problem:   Acute pyelonephritis, left Active Problems:   UTI (urinary tract infection)   AKI (acute kidney injury) (HCC)   Acute left sided  Pyelonephritis Continue IV Rocephin.  Continue IV fluids because of poor oral intake.  Follow-up urine and blood cultures. leukocytosis has improved  Acute kidney injury Improving.  Continue IV fluids and monitor BMP.  Baseline creatinine is around 0.89.  COPD/asthma Continue bronchodilators   tobacco abuse -encourage cessation  -patient does not want a nicotine patch   Hyperlipidemia   continue statin  Glucose intolerance Ada diet  Check finger sticks  Sleep Apnea -cpap as able   GERD  -ppi   Bipolar disorder d/o/anxiety  Continue psychotropics  Parkinson's disease/ Restless legs syndrome Continue Ropinirole and benztropine   Body mass index is 41.89 kg/m.  (Morbid obesity)   Family Communication/Anticipated D/C date and plan/Code Status   DVT prophylaxis: Heparin Code Status: Full code Family Communication: Plan discussed with patient Disposition Plan: Patient is from home.  Plan to discharge her home after urine and blood culture results are  available.      Subjective:   She complains of left flank pain which he grades as 8/10 in severity.  She has poor appetite and she has not eating well for several days.  Chills have improved.  Objective:    Vitals:   05/14/19 2216 05/15/19 0210 05/15/19 0508 05/15/19 1131  BP: 107/70  (!) 109/54 120/60  Pulse: 95  97 (!) 102  Resp: 19  19 18   Temp: 99.2 F (37.3 C)  100.1 F (37.8 C) 98.3 F (36.8 C)  TempSrc: Oral  Oral   SpO2: 94%  95% 98%  Weight:  100.6 kg    Height:        Intake/Output Summary (Last 24 hours) at 05/15/2019 1154 Last data filed at 05/15/2019 8119 Gross per 24 hour  Intake 2534.03 ml  Output 400 ml  Net 2134.03 ml   Filed Weights   05/14/19 1549 05/14/19 2215 05/15/19 0210  Weight: 102.1 kg 100.7 kg 100.6 kg    Exam:  GEN: NAD SKIN: No rash EYES: EOMI ENT: MMM CV: RRR PULM: Decreased air entry bilaterally, bilateral expiratory wheezing, no rales heard ABD: soft, obese, NT, +BS CNS: AAO x 3, non focal, tremulous right lower extremity EXT: No edema or tenderness GU: Left flank and left costovertebral angle tenderness  Data Reviewed:   I have personally reviewed following labs and imaging studies:  Labs: Labs show the following:   Basic Metabolic Panel: Recent Labs  Lab 05/14/19 1553 05/14/19 2042 05/15/19 0412  NA 133*  --  136  K 3.6  --  3.9  CL 98  --  104  CO2  22  --  23  GLUCOSE 109*  --  107*  BUN 17  --  15  CREATININE 2.02* 1.80* 1.38*  CALCIUM 8.9  --  7.7*   GFR Estimated Creatinine Clearance: 47.7 mL/min (A) (by C-G formula based on SCr of 1.38 mg/dL (H)). Liver Function Tests: Recent Labs  Lab 05/14/19 1553  AST 27  ALT 26  ALKPHOS 73  BILITOT 0.9  PROT 7.0  ALBUMIN 3.4*   Recent Labs  Lab 05/14/19 1553  LIPASE 24   No results for input(s): AMMONIA in the last 168 hours. Coagulation profile Recent Labs  Lab 05/14/19 1934  INR 1.2    CBC: Recent Labs  Lab 05/14/19 1553 05/14/19 01/25/2041  05/15/19 0412  WBC 14.4* 11.4* 9.8  HGB 13.4 11.9* 11.0*  HCT 39.6 36.4 32.5*  MCV 92.3 94.3 92.3  PLT 195 158 143*   Cardiac Enzymes: Recent Labs  Lab 05/14/19 1553  CKTOTAL 217   BNP (last 3 results) No results for input(s): PROBNP in the last 8760 hours. CBG: No results for input(s): GLUCAP in the last 168 hours. D-Dimer: No results for input(s): DDIMER in the last 72 hours. Hgb A1c: No results for input(s): HGBA1C in the last 72 hours. Lipid Profile: No results for input(s): CHOL, HDL, LDLCALC, TRIG, CHOLHDL, LDLDIRECT in the last 72 hours. Thyroid function studies: Recent Labs    05/14/19 January 25, 2041  TSH 1.000   Anemia work up: No results for input(s): VITAMINB12, FOLATE, FERRITIN, TIBC, IRON, RETICCTPCT in the last 72 hours. Sepsis Labs: Recent Labs  Lab 05/14/19 1553 05/14/19 1852 05/14/19 01/26/2040 05/14/19 01-25-41 05/15/19 0412  WBC 14.4*  --   --  11.4* 9.8  LATICACIDVEN  --  1.4 1.4  --   --     Microbiology Recent Results (from the past 240 hour(s))  Blood culture (routine x 2)     Status: None (Preliminary result)   Collection Time: 05/14/19  6:52 PM   Specimen: BLOOD  Result Value Ref Range Status   Specimen Description BLOOD LEFT ANTECUBITAL  Final   Special Requests   Final    BOTTLES DRAWN AEROBIC AND ANAEROBIC Blood Culture adequate volume   Culture   Final    NO GROWTH < 12 HOURS Performed at [facility], [address] [place], [place] [address]    Report Status PENDING  Incomplete  Blood culture (routine x 2)     Status: None (Preliminary result)   Collection Time: 05/14/19  6:52 PM   Specimen: BLOOD  Result Value Ref Range Status   Specimen Description BLOOD RIGHT ANTECUBITAL  Final   Special Requests   Final    BOTTLES DRAWN AEROBIC AND ANAEROBIC Blood Culture adequate volume   Culture   Final    NO GROWTH < 12 HOURS Performed at [facility], [address] [place], [place] [address]    Report Status PENDING   Incomplete  SARS CORONAVIRUS 2 (TAT 6-24 HRS) Nasopharyngeal Nasopharyngeal Swab     Status: None   Collection Time: 05/14/19  7:31 PM   Specimen: Nasopharyngeal Swab  Result Value Ref Range Status   SARS Coronavirus 2 NEGATIVE NEGATIVE Final    Comment: (NOTE) SARS-CoV-2 target nucleic acids are NOT DETECTED. The SARS-CoV-2 RNA is generally detectable in upper and lower respiratory specimens during the acute phase of infection. Negative results do not preclude SARS-CoV-2 infection, do not rule out co-infections with other pathogens, and should not be used as the sole  basis for treatment or other patient management decisions. Negative results must be combined with clinical observations, patient history, and epidemiological information. The expected result is Negative. Fact Sheet for Patients: HairSlick.no Fact Sheet for Healthcare Providers: quierodirigir.com This test is not yet approved or cleared by the [place] FDA and  has been authorized for detection and/or diagnosis of SARS-CoV-2 by FDA under an Emergency Use Authorization (EUA). This EUA will remain  in effect (meaning this test can be used) for the duration of the COVID-19 declaration under Section 56 4(b)(1) of the Act, 21 U.S.C. section 360bbb-3(b)(1), unless the authorization is terminated or revoked sooner. Performed at [facility], [address], [place], [place] [address]     Procedures and diagnostic studies:  CT Renal Stone Study  Result Date: 05/14/2019 CLINICAL DATA:  Left flank pain for 3 days.  Decreased urine output. EXAM: CT ABDOMEN AND PELVIS WITHOUT CONTRAST TECHNIQUE: Multidetector CT imaging of the abdomen and pelvis was performed following the standard protocol without IV contrast. COMPARISON:  04/17/2014 FINDINGS: Lower chest: Clear lung bases.  Heart normal in size. Hepatobiliary: Liver normal in size. Decreased attenuation of the  liver diffusely consistent with fatty infiltration. 1 cm hypoattenuating lesion in the right lobe, stable consistent with a cyst. No other liver masses or lesions. Normal gallbladder. No bile duct dilation. Pancreas: Unremarkable. No pancreatic ductal dilatation or surrounding inflammatory changes. Spleen: Normal in size without focal abnormality. Adrenals/Urinary Tract: No adrenal masses. There is left perinephric stranding. No hydronephrosis. No renal mass or stone. Normal left ureter. Normal right kidney. No right hydronephrosis. Normal right ureter. Bladder is unremarkable. Stomach/Bowel: Stomach is within normal limits. Appendix appears normal. No evidence of bowel wall thickening, distention, or inflammatory changes. Vascular/Lymphatic: Aortic atherosclerosis. No aneurysm. No enlarged lymph nodes. Reproductive: Uterus and bilateral adnexa are unremarkable. Other: No abdominal wall hernia or abnormality. No abdominopelvic ascites. Musculoskeletal: No fracture or acute finding. Chronic avascular necrosis of both femoral heads without subchondral collapse. No osteoblastic or osteolytic lesions. IMPRESSION: 1. Left perinephric stranding, which is new compared to the prior CT. There is no left hydronephrosis or hydroureter. No renal or ureteral stones. Findings may be due to left pyelonephritis if this correlates clinically. No evidence of an abscess. 2. No other acute abnormality within the abdomen or pelvis. 3. Hepatic steatosis. 4. Aortic atherosclerosis. Electronically Signed   By: Amie Portland M.D.   On: 05/14/2019 18:28    Medications:   . benztropine  1 mg Oral QHS  . divalproex  250 mg Oral BID  . escitalopram  20 mg Oral Daily  . heparin  5,000 Units Subcutaneous Q8H  . lurasidone  60 mg Oral QHS  . multivitamin with minerals  1 tablet Oral Daily  . rOPINIRole  4 mg Oral QHS  . umeclidinium-vilanterol  1 puff Inhalation Daily   Continuous Infusions: . sodium chloride 75 mL/hr at 05/15/19  1054  . cefTRIAXone (ROCEPHIN)  IV       LOS: 1 day   Asante Blanda  [facility]     05/15/2019, 11:54 AM

## 2019-05-16 LAB — CBC WITH DIFFERENTIAL/PLATELET
Abs Immature Granulocytes: 0.03 10*3/uL (ref 0.00–0.07)
Basophils Absolute: 0 10*3/uL (ref 0.0–0.1)
Basophils Relative: 0 %
Eosinophils Absolute: 0.1 10*3/uL (ref 0.0–0.5)
Eosinophils Relative: 2 %
HCT: 30.7 % — ABNORMAL LOW (ref 36.0–46.0)
Hemoglobin: 10.1 g/dL — ABNORMAL LOW (ref 12.0–15.0)
Immature Granulocytes: 0 %
Lymphocytes Relative: 13 %
Lymphs Abs: 1 10*3/uL (ref 0.7–4.0)
MCH: 31.2 pg (ref 26.0–34.0)
MCHC: 32.9 g/dL (ref 30.0–36.0)
MCV: 94.8 fL (ref 80.0–100.0)
Monocytes Absolute: 1 10*3/uL (ref 0.1–1.0)
Monocytes Relative: 13 %
Neutro Abs: 5.4 10*3/uL (ref 1.7–7.7)
Neutrophils Relative %: 72 %
Platelets: 144 10*3/uL — ABNORMAL LOW (ref 150–400)
RBC: 3.24 MIL/uL — ABNORMAL LOW (ref 3.87–5.11)
RDW: 12.2 % (ref 11.5–15.5)
WBC: 7.6 10*3/uL (ref 4.0–10.5)
nRBC: 0 % (ref 0.0–0.2)

## 2019-05-16 LAB — BASIC METABOLIC PANEL
Anion gap: 6 (ref 5–15)
BUN: 11 mg/dL (ref 6–20)
CO2: 26 mmol/L (ref 22–32)
Calcium: 8 mg/dL — ABNORMAL LOW (ref 8.9–10.3)
Chloride: 103 mmol/L (ref 98–111)
Creatinine, Ser: 1.09 mg/dL — ABNORMAL HIGH (ref 0.44–1.00)
GFR calc Af Amer: >60 mL/min (ref >60)
GFR calc non Af Amer: 56 mL/min — ABNORMAL LOW (ref >60)
Glucose, Bld: 100 mg/dL — ABNORMAL HIGH (ref 70–99)
Potassium: 4 mmol/L (ref 3.5–5.1)
Sodium: 135 mmol/L (ref 135–145)

## 2019-05-16 LAB — URINE CULTURE: Culture: >=100000 — AB

## 2019-05-16 NOTE — Plan of Care (Signed)
?  Problem: Clinical Measurements: ?Goal: Will remain free from infection ?Outcome: Progressing ?  ?

## 2019-05-16 NOTE — Progress Notes (Signed)
Progress Note    Nicole Austin  OFB:[id number] DOB: Jun 17, 1960  DOA: 05/14/2019 PCP: Danelle Berry, PA-C      Brief Narrative:    Medical records reviewed and are as summarized below:  Nicole Austin is an 59 y.o. female with medical history significant of COPD/asthma, tobacco abuse,hyperlipidemia , glucose intolerance, sleep apnea, GERD,Parkinson's disease, RLS, tremors, and bipolar disorder with depression and anxiety flank pain fever and chills for about 4 days prior to admission.  She also had urinary urgency and hesitancy.  He had poor appetite and vomited once.  She presented to the emergency room at the behest of her primary care physician after she was evaluated by PCP via tele consult.     Assessment/Plan:   Principal Problem:   Acute pyelonephritis, left Active Problems:   UTI (urinary tract infection)   AKI (acute kidney injury) (HCC)   Acute left sided  Pyelonephritis Urine culture showed E. coli.  Continue IV Rocephin.  Follow-up blood cultures.   Acute kidney injury Improving.  Discontinue IV fluids and monitor BMP.  Baseline creatinine is around 0.89.  COPD/asthma Continue bronchodilators   tobacco abuse -encourage cessation  -patient does not want a nicotine patch   Hyperlipidemia   continue statin  Glucose intolerance Ada diet  Check finger sticks  Sleep Apnea -cpap as able   GERD  -ppi   Bipolar disorder d/o/anxiety  Continue psychotropics  Parkinson's disease/ Restless legs syndrome Continue Ropinirole and benztropine   Body mass index is 42.36 kg/m.  (Morbid obesity)   Family Communication/Anticipated D/C date and plan/Code Status   DVT prophylaxis: Heparin Code Status: Full code Family Communication: Plan discussed with patient Disposition Plan: Patient is from home.  Plan to discharge her home tomorrow if there is no growth on blood cultures      Subjective:   She feels better today.  Left  flank pain and back pain are improving.  Her appetite is improving.  Objective:    Vitals:   05/15/19 1928 05/16/19 0356 05/16/19 0729 05/16/19 1158  BP: (!) 115/56 (!) 95/58 (!) 106/57 (!) 119/58  Pulse: 88 94 73 78  Resp: 19 19 18    Temp: 98.8 F (37.1 C) 99.9 F (37.7 C) 98.2 F (36.8 C) 98.7 F (37.1 C)  TempSrc: Oral Oral    SpO2: 96% 91% 91% 98%  Weight:  101.7 kg    Height:        Intake/Output Summary (Last 24 hours) at 05/16/2019 1458 Last data filed at 05/16/2019 1451 Gross per 24 hour  Intake 945.96 ml  Output 1600 ml  Net -654.04 ml   Filed Weights   05/14/19 2215 05/15/19 0210 05/16/19 0356  Weight: 100.7 kg 100.6 kg 101.7 kg    Exam:  GEN: NAD SKIN: No rash EYES: EOMI ENT: MMM CV: RRR PULM: Air entry adequate bilaterally, mild bilateral expiratory wheezing, no rales heard ABD: soft, obese, NT, +BS CNS: AAO x 3, non focal EXT: No edema or tenderness GU: No flank or CVA tenderness  Data Reviewed:   I have personally reviewed following labs and imaging studies:  Labs: Labs show the following:   Basic Metabolic Panel: Recent Labs  Lab 05/14/19 1553 05/14/19 1553 05/14/19 2042 05/15/19 0412 05/16/19 0605  NA 133*  --   --  136 135  K 3.6   < >  --  3.9 4.0  CL 98  --   --  104 103  CO2 22  --   --  23 26  GLUCOSE 109*  --   --  107* 100*  BUN 17  --   --  15 11  CREATININE 2.02*  --  1.80* 1.38* 1.09*  CALCIUM 8.9  --   --  7.7* 8.0*   < > = values in this interval not displayed.   GFR Estimated Creatinine Clearance: 60.9 mL/min (A) (by C-G formula based on SCr of 1.09 mg/dL (H)). Liver Function Tests: Recent Labs  Lab 05/14/19 1553  AST 27  ALT 26  ALKPHOS 73  BILITOT 0.9  PROT 7.0  ALBUMIN 3.4*   Recent Labs  Lab 05/14/19 1553  LIPASE 24   No results for input(s): AMMONIA in the last 168 hours. Coagulation profile Recent Labs  Lab 05/14/19 1934  INR 1.2    CBC: Recent Labs  Lab 05/14/19 1553 05/14/19 Jan 30, 2041  05/15/19 0412 05/16/19 0605  WBC 14.4* 11.4* 9.8 7.6  NEUTROABS  --   --   --  5.4  HGB 13.4 11.9* 11.0* 10.1*  HCT 39.6 36.4 32.5* 30.7*  MCV 92.3 94.3 92.3 94.8  PLT 195 158 143* 144*   Cardiac Enzymes: Recent Labs  Lab 05/14/19 1553  CKTOTAL 217   BNP (last 3 results) No results for input(s): PROBNP in the last 8760 hours. CBG: No results for input(s): GLUCAP in the last 168 hours. D-Dimer: No results for input(s): DDIMER in the last 72 hours. Hgb A1c: Recent Labs    05/14/19 01-30-2041  HGBA1C 5.6   Lipid Profile: No results for input(s): CHOL, HDL, LDLCALC, TRIG, CHOLHDL, LDLDIRECT in the last 72 hours. Thyroid function studies: Recent Labs    05/14/19 01-30-41  TSH 1.000   Anemia work up: No results for input(s): VITAMINB12, FOLATE, FERRITIN, TIBC, IRON, RETICCTPCT in the last 72 hours. Sepsis Labs: Recent Labs  Lab 05/14/19 1553 05/14/19 1852 05/14/19 Jan 31, 2040 05/14/19 January 30, 2041 05/15/19 0412 05/16/19 0605  WBC 14.4*  --   --  11.4* 9.8 7.6  LATICACIDVEN  --  1.4 1.4  --   --   --     Microbiology Recent Results (from the past 240 hour(s))  Urine culture     Status: Abnormal   Collection Time: 05/14/19  3:54 PM   Specimen: Urine, Random  Result Value Ref Range Status   Specimen Description   Final    URINE, RANDOM Performed at [facility], [address] [place], [place] [address]    Special Requests   Final    NONE Performed at [facility], [address] [place], [place] [address]    Culture >=100,000 COLONIES/mL ESCHERICHIA COLI (A)  Final   Report Status 05/16/2019 FINAL  Final   Organism ID, Bacteria ESCHERICHIA COLI (A)  Final      Susceptibility   Escherichia coli - MIC*    AMPICILLIN 4 SENSITIVE Sensitive     CEFAZOLIN <=4 SENSITIVE Sensitive     CEFTRIAXONE <=0.25 SENSITIVE Sensitive     CIPROFLOXACIN <=0.25 SENSITIVE Sensitive     GENTAMICIN <=1 SENSITIVE Sensitive     IMIPENEM <=0.25 SENSITIVE Sensitive      NITROFURANTOIN <=16 SENSITIVE Sensitive     TRIMETH/SULFA <=20 SENSITIVE Sensitive     AMPICILLIN/SULBACTAM <=2 SENSITIVE Sensitive     PIP/TAZO <=4 SENSITIVE Sensitive     * >=100,000 COLONIES/mL ESCHERICHIA COLI  Blood culture (routine x 2)     Status: None (Preliminary result)   Collection Time: 05/14/19  6:52 PM   Specimen: BLOOD  Result Value Ref Range Status   Specimen Description BLOOD LEFT ANTECUBITAL  Final   Special Requests   Final    BOTTLES DRAWN AEROBIC AND ANAEROBIC Blood Culture adequate volume   Culture   Final    NO GROWTH 2 DAYS Performed at [facility], [address] [place], [place] [address]    Report Status PENDING  Incomplete  Blood culture (routine x 2)     Status: None (Preliminary result)   Collection Time: 05/14/19  6:52 PM   Specimen: BLOOD  Result Value Ref Range Status   Specimen Description BLOOD RIGHT ANTECUBITAL  Final   Special Requests   Final    BOTTLES DRAWN AEROBIC AND ANAEROBIC Blood Culture adequate volume   Culture   Final    NO GROWTH 2 DAYS Performed at [facility], [address], [place], [place] [address]    Report Status PENDING  Incomplete  SARS CORONAVIRUS 2 (TAT 6-24 HRS) Nasopharyngeal Nasopharyngeal Swab     Status: None   Collection Time: 05/14/19  7:31 PM   Specimen: Nasopharyngeal Swab  Result Value Ref Range Status   SARS Coronavirus 2 NEGATIVE NEGATIVE Final    Comment: (NOTE) SARS-CoV-2 target nucleic acids are NOT DETECTED. The SARS-CoV-2 RNA is generally detectable in upper and lower respiratory specimens during the acute phase of infection. Negative results do not preclude SARS-CoV-2 infection, do not rule out co-infections with other pathogens, and should not be used as the sole basis for treatment or other patient management decisions. Negative results must be combined with clinical observations, patient history, and epidemiological information. The expected result is Negative. Fact  Sheet for Patients: HairSlick.no Fact Sheet for Healthcare Providers: quierodirigir.com This test is not yet approved or cleared by the [place] FDA and  has been authorized for detection and/or diagnosis of SARS-CoV-2 by FDA under an Emergency Use Authorization (EUA). This EUA will remain  in effect (meaning this test can be used) for the duration of the COVID-19 declaration under Section 56 4(b)(1) of the Act, 21 U.S.C. section 360bbb-3(b)(1), unless the authorization is terminated or revoked sooner. Performed at [facility], [address] [place], [place] [address]     Procedures and diagnostic studies:  CT Renal Stone Study  Result Date: 05/14/2019 CLINICAL DATA:  Left flank pain for 3 days.  Decreased urine output. EXAM: CT ABDOMEN AND PELVIS WITHOUT CONTRAST TECHNIQUE: Multidetector CT imaging of the abdomen and pelvis was performed following the standard protocol without IV contrast. COMPARISON:  04/17/2014 FINDINGS: Lower chest: Clear lung bases.  Heart normal in size. Hepatobiliary: Liver normal in size. Decreased attenuation of the liver diffusely consistent with fatty infiltration. 1 cm hypoattenuating lesion in the right lobe, stable consistent with a cyst. No other liver masses or lesions. Normal gallbladder. No bile duct dilation. Pancreas: Unremarkable. No pancreatic ductal dilatation or surrounding inflammatory changes. Spleen: Normal in size without focal abnormality. Adrenals/Urinary Tract: No adrenal masses. There is left perinephric stranding. No hydronephrosis. No renal mass or stone. Normal left ureter. Normal right kidney. No right hydronephrosis. Normal right ureter. Bladder is unremarkable. Stomach/Bowel: Stomach is within normal limits. Appendix appears normal. No evidence of bowel wall thickening, distention, or inflammatory changes. Vascular/Lymphatic: Aortic atherosclerosis. No aneurysm. No enlarged  lymph nodes. Reproductive: Uterus and bilateral adnexa are unremarkable. Other: No abdominal wall hernia or abnormality. No abdominopelvic ascites. Musculoskeletal: No fracture or acute finding. Chronic avascular necrosis of both femoral heads without subchondral collapse. No osteoblastic or  osteolytic lesions. IMPRESSION: 1. Left perinephric stranding, which is new compared to the prior CT. There is no left hydronephrosis or hydroureter. No renal or ureteral stones. Findings may be due to left pyelonephritis if this correlates clinically. No evidence of an abscess. 2. No other acute abnormality within the abdomen or pelvis. 3. Hepatic steatosis. 4. Aortic atherosclerosis. Electronically Signed   By: Amie Portland M.D.   On: 05/14/2019 18:28    Medications:   . benztropine  1 mg Oral QHS  . divalproex  250 mg Oral BID  . escitalopram  20 mg Oral Daily  . heparin  5,000 Units Subcutaneous Q8H  . lurasidone  60 mg Oral QHS  . multivitamin with minerals  1 tablet Oral Daily  . rOPINIRole  4 mg Oral QHS  . umeclidinium-vilanterol  1 puff Inhalation Daily   Continuous Infusions: . sodium chloride 75 mL/hr at 05/15/19 2050  . cefTRIAXone (ROCEPHIN)  IV 2 g (05/15/19 2050)     LOS: 2 days   Euclide Granito  [facility]     05/16/2019, 2:58 PM

## 2019-05-17 LAB — BASIC METABOLIC PANEL
Anion gap: 11 (ref 5–15)
BUN: 9 mg/dL (ref 6–20)
CO2: 29 mmol/L (ref 22–32)
Calcium: 8.6 mg/dL — ABNORMAL LOW (ref 8.9–10.3)
Chloride: 98 mmol/L (ref 98–111)
Creatinine, Ser: 1.12 mg/dL — ABNORMAL HIGH (ref 0.44–1.00)
GFR calc Af Amer: >60 mL/min (ref >60)
GFR calc non Af Amer: 54 mL/min — ABNORMAL LOW (ref >60)
Glucose, Bld: 104 mg/dL — ABNORMAL HIGH (ref 70–99)
Potassium: 4.2 mmol/L (ref 3.5–5.1)
Sodium: 138 mmol/L (ref 135–145)

## 2019-05-17 MED ORDER — LEVOFLOXACIN 500 MG PO TABS: 500.0000 mg | ORAL_TABLET | ORAL | Status: DC

## 2019-05-17 MED ORDER — LEVOFLOXACIN 750 MG PO TABS: 750.0000 mg | ORAL_TABLET | ORAL | 0 refills | Status: DC

## 2019-05-17 MED ORDER — TIZANIDINE HCL 2 MG PO TABS: 4.0000 mg | ORAL_TABLET | Freq: Three times a day (TID) | ORAL | Status: DC | PRN

## 2019-05-17 MED ORDER — LEVOFLOXACIN 750 MG PO TABS: 750.0000 mg | ORAL_TABLET | ORAL | Status: DC

## 2019-05-17 NOTE — Discharge Summary (Addendum)
Physician Discharge Summary  Vaeda Maye Javed ZDG:[id number] DOB: 10-Feb-1961 DOA: 05/14/2019  PCP: Danelle Berry, PA-C  Admit date: 05/14/2019 Discharge date: 05/17/2019  Discharge disposition: Home   Recommendations for Outpatient Follow-Up:   Outpatient follow-up with PCP in 1 week   Discharge Diagnosis:   Principal Problem:   Acute pyelonephritis, left Active Problems:   UTI (urinary tract infection)   AKI (acute kidney injury) (HCC)    Discharge Condition: Stable.  Diet recommendation: Low-salt low sugar diet  Code status: Full code    Hospital Course:   Ms. Beda Rabuck is a 59 y.o. female with medical history significant ofCOPD/asthma, tobacco abuse,hyperlipidemia, glucose intolerance, sleep apnea,GERD,Parkinson's disease, RLS, tremors, and bipolar disorder with depression and anxiety.  She presented to the hospital with left flank pain, fever and chills for about 4 days prior to admission.  She also had urinary urgency and hesitancy.  She had poor appetite and vomited once.  She presented to the emergency room at the behest of her primary care physician after she was evaluated by PCP via tele consult.  She was admitted to the hospital for acute kidney injury and sepsis secondary to acute left pyelonephritis.  She was treated with analgesics, antiemetics, empiric IV antibiotics and IV fluids.  Urine culture showed E. coli and she was switched from IV Rocephin to oral Levaquin at discharge.  Her condition has improved and she is deemed stable for discharge to home.      Discharge Exam:   Vitals:   05/17/19 0505 05/17/19 0808  BP: (!) 101/49 129/74  Pulse: 73 70  Resp: 20 16  Temp: 98.6 F (37 C) 98.4 F (36.9 C)  SpO2: 96% 98%   Vitals:   05/16/19 1628 05/16/19 1951 05/17/19 0505 05/17/19 0808  BP: (!) 115/98 (!) 123/57 (!) 101/49 129/74  Pulse: 90 (!) 108 73 70  Resp:  20 20 16   Temp: 100.1 F (37.8 C) 98 F (36.7 C) 98.6 F (37 C)  98.4 F (36.9 C)  TempSrc:  Oral Oral   SpO2: 92% 92% 96% 98%  Weight:   102.2 kg   Height:         GEN: NAD SKIN: No rash EYES: EOMI ENT: MMM CV: RRR PULM: CTA B ABD: soft, obese, NT, +BS CNS: AAO x 3, non focal EXT: No edema or tenderness   The results of significant diagnostics from this hospitalization (including imaging, microbiology, ancillary and laboratory) are listed below for reference.     Procedures and Diagnostic Studies:   CT Renal Stone Study  Result Date: 05/14/2019 CLINICAL DATA:  Left flank pain for 3 days.  Decreased urine output. EXAM: CT ABDOMEN AND PELVIS WITHOUT CONTRAST TECHNIQUE: Multidetector CT imaging of the abdomen and pelvis was performed following the standard protocol without IV contrast. COMPARISON:  04/17/2014 FINDINGS: Lower chest: Clear lung bases.  Heart normal in size. Hepatobiliary: Liver normal in size. Decreased attenuation of the liver diffusely consistent with fatty infiltration. 1 cm hypoattenuating lesion in the right lobe, stable consistent with a cyst. No other liver masses or lesions. Normal gallbladder. No bile duct dilation. Pancreas: Unremarkable. No pancreatic ductal dilatation or surrounding inflammatory changes. Spleen: Normal in size without focal abnormality. Adrenals/Urinary Tract: No adrenal masses. There is left perinephric stranding. No hydronephrosis. No renal mass or stone. Normal left ureter. Normal right kidney. No right hydronephrosis. Normal right ureter. Bladder is unremarkable. Stomach/Bowel: Stomach is within normal limits. Appendix appears normal. No evidence of bowel wall  thickening, distention, or inflammatory changes. Vascular/Lymphatic: Aortic atherosclerosis. No aneurysm. No enlarged lymph nodes. Reproductive: Uterus and bilateral adnexa are unremarkable. Other: No abdominal wall hernia or abnormality. No abdominopelvic ascites. Musculoskeletal: No fracture or acute finding. Chronic avascular necrosis of both  femoral heads without subchondral collapse. No osteoblastic or osteolytic lesions. IMPRESSION: 1. Left perinephric stranding, which is new compared to the prior CT. There is no left hydronephrosis or hydroureter. No renal or ureteral stones. Findings may be due to left pyelonephritis if this correlates clinically. No evidence of an abscess. 2. No other acute abnormality within the abdomen or pelvis. 3. Hepatic steatosis. 4. Aortic atherosclerosis. Electronically Signed   By: Amie Portland M.D.   On: 05/14/2019 18:28     Labs:   Basic Metabolic Panel: Recent Labs  Lab 05/14/19 1553 05/14/19 1553 05/14/19 2042 05/15/19 0412 05/15/19 0412 05/16/19 0605 05/17/19 0429  NA 133*  --   --  136  --  135 138  K 3.6   < >  --  3.9   < > 4.0 4.2  CL 98  --   --  104  --  103 98  CO2 22  --   --  23  --  26 29  GLUCOSE 109*  --   --  107*  --  100* 104*  BUN 17  --   --  15  --  11 9  CREATININE 2.02*  --  1.80* 1.38*  --  1.09* 1.12*  CALCIUM 8.9  --   --  7.7*  --  8.0* 8.6*   < > = values in this interval not displayed.   GFR Estimated Creatinine Clearance: 59.4 mL/min (A) (by C-G formula based on SCr of 1.12 mg/dL (H)). Liver Function Tests: Recent Labs  Lab 05/14/19 1553  AST 27  ALT 26  ALKPHOS 73  BILITOT 0.9  PROT 7.0  ALBUMIN 3.4*   Recent Labs  Lab 05/14/19 1553  LIPASE 24   No results for input(s): AMMONIA in the last 168 hours. Coagulation profile Recent Labs  Lab 05/14/19 1934  INR 1.2    CBC: Recent Labs  Lab 05/14/19 1553 05/14/19 2042 05/15/19 0412 05/16/19 0605  WBC 14.4* 11.4* 9.8 7.6  NEUTROABS  --   --   --  5.4  HGB 13.4 11.9* 11.0* 10.1*  HCT 39.6 36.4 32.5* 30.7*  MCV 92.3 94.3 92.3 94.8  PLT 195 158 143* 144*   Cardiac Enzymes: Recent Labs  Lab 05/14/19 1553  CKTOTAL 217   BNP: Invalid input(s): POCBNP CBG: No results for input(s): GLUCAP in the last 168 hours. D-Dimer No results for input(s): DDIMER in the last 72 hours. Hgb  A1c Recent Labs    05/14/19 2042  HGBA1C 5.6   Lipid Profile No results for input(s): CHOL, HDL, LDLCALC, TRIG, CHOLHDL, LDLDIRECT in the last 72 hours. Thyroid function studies Recent Labs    05/14/19 2042  TSH 1.000   Anemia work up No results for input(s): VITAMINB12, FOLATE, FERRITIN, TIBC, IRON, RETICCTPCT in the last 72 hours. Microbiology Recent Results (from the past 240 hour(s))  Urine culture     Status: Abnormal   Collection Time: 05/14/19  3:54 PM   Specimen: Urine, Random  Result Value Ref Range Status   Specimen Description   Final    URINE, RANDOM Performed at [facility], [address] [place], [place] [address]    Special Requests   Final    NONE  Performed at [facility], [address] [place], [place] [address]    Culture >=100,000 COLONIES/mL ESCHERICHIA COLI (A)  Final   Report Status 05/16/2019 FINAL  Final   Organism ID, Bacteria ESCHERICHIA COLI (A)  Final      Susceptibility   Escherichia coli - MIC*    AMPICILLIN 4 SENSITIVE Sensitive     CEFAZOLIN <=4 SENSITIVE Sensitive     CEFTRIAXONE <=0.25 SENSITIVE Sensitive     CIPROFLOXACIN <=0.25 SENSITIVE Sensitive     GENTAMICIN <=1 SENSITIVE Sensitive     IMIPENEM <=0.25 SENSITIVE Sensitive     NITROFURANTOIN <=16 SENSITIVE Sensitive     TRIMETH/SULFA <=20 SENSITIVE Sensitive     AMPICILLIN/SULBACTAM <=2 SENSITIVE Sensitive     PIP/TAZO <=4 SENSITIVE Sensitive     * >=100,000 COLONIES/mL ESCHERICHIA COLI  Blood culture (routine x 2)     Status: None (Preliminary result)   Collection Time: 05/14/19  6:52 PM   Specimen: BLOOD  Result Value Ref Range Status   Specimen Description BLOOD LEFT ANTECUBITAL  Final   Special Requests   Final    BOTTLES DRAWN AEROBIC AND ANAEROBIC Blood Culture adequate volume   Culture   Final    NO GROWTH 3 DAYS Performed at [facility], [address], [place], [place] [address]    Report Status PENDING  Incomplete   Blood culture (routine x 2)     Status: None (Preliminary result)   Collection Time: 05/14/19  6:52 PM   Specimen: BLOOD  Result Value Ref Range Status   Specimen Description BLOOD RIGHT ANTECUBITAL  Final   Special Requests   Final    BOTTLES DRAWN AEROBIC AND ANAEROBIC Blood Culture adequate volume   Culture   Final    NO GROWTH 3 DAYS Performed at [facility], [address] [place], [place] [address]    Report Status PENDING  Incomplete  SARS CORONAVIRUS 2 (TAT 6-24 HRS) Nasopharyngeal Nasopharyngeal Swab     Status: None   Collection Time: 05/14/19  7:31 PM   Specimen: Nasopharyngeal Swab  Result Value Ref Range Status   SARS Coronavirus 2 NEGATIVE NEGATIVE Final    Comment: (NOTE) SARS-CoV-2 target nucleic acids are NOT DETECTED. The SARS-CoV-2 RNA is generally detectable in upper and lower respiratory specimens during the acute phase of infection. Negative results do not preclude SARS-CoV-2 infection, do not rule out co-infections with other pathogens, and should not be used as the sole basis for treatment or other patient management decisions. Negative results must be combined with clinical observations, patient history, and epidemiological information. The expected result is Negative. Fact Sheet for Patients: HairSlick.no Fact Sheet for Healthcare Providers: quierodirigir.com This test is not yet approved or cleared by the [place] FDA and  has been authorized for detection and/or diagnosis of SARS-CoV-2 by FDA under an Emergency Use Authorization (EUA). This EUA will remain  in effect (meaning this test can be used) for the duration of the COVID-19 declaration under Section 56 4(b)(1) of the Act, 21 U.S.C. section 360bbb-3(b)(1), unless the authorization is terminated or revoked sooner. Performed at [facility], [address] [place], [place] [address]      Discharge Instructions:    Discharge Instructions    Diet - low sodium heart healthy   Complete by: As directed    Increase activity slowly   Complete by: As directed      Allergies as of 05/17/2019      Reactions  Chantix [varenicline] Nausea Only   Codeine Nausea And Vomiting      Medication List    TAKE these medications   ALPRAZolam 0.5 MG tablet Commonly known as: XANAX Take 0.5 mg by mouth 4 (four) times daily.   Anoro Ellipta 62.5-25 MCG/INH Aepb Generic drug: umeclidinium-vilanterol Inhale 1 puff into the lungs daily.   benztropine 1 MG tablet Commonly known as: COGENTIN Take 1 mg by mouth at bedtime.   divalproex 250 MG DR tablet Commonly known as: DEPAKOTE Take 1 tablet (250 mg total) by mouth 2 (two) times daily.   escitalopram 20 MG tablet Commonly known as: LEXAPRO Take 1 tablet (20 mg total) by mouth daily.   Latuda 60 MG Tabs Generic drug: Lurasidone HCl Take 60 mg by mouth at bedtime.   levofloxacin 750 MG tablet Commonly known as: LEVAQUIN Take 1 tablet (750 mg total) by mouth every other day. Start taking on: May 19, 2019   meloxicam 15 MG tablet Commonly known as: MOBIC Take 15 mg by mouth daily.   methocarbamol 500 MG tablet Commonly known as: ROBAXIN Take 500 mg by mouth in the morning and at bedtime.   multivitamin with minerals Tabs tablet Take 1 tablet by mouth daily.   promethazine 25 MG tablet Commonly known as: PHENERGAN Take 25 mg by mouth 2 (two) times daily as needed for nausea or vomiting.   rOPINIRole 4 MG tablet Commonly known as: REQUIP Take 4 mg by mouth at bedtime.   tiZANidine 2 MG tablet Commonly known as: ZANAFLEX Take 2 tablets (4 mg total) by mouth 3 (three) times daily as needed for muscle spasms. What changed: See the new instructions.         Time coordinating discharge: 28 minutes  Signed:  Katrenia Alkins  [facility] 05/17/2019, 10:47 AM

## 2019-05-17 NOTE — Plan of Care (Signed)
  Problem: Clinical Measurements: Goal: Will remain free from infection Outcome: Progressing Goal: Respiratory complications will improve Outcome: Progressing   Problem: Nutrition: Goal: Adequate nutrition will be maintained Outcome: Progressing   Problem: Coping: Goal: Level of anxiety will decrease Outcome: Progressing   Problem: Pain Managment: Goal: General experience of comfort will improve Outcome: Progressing

## 2019-05-17 NOTE — Progress Notes (Signed)
Discharge instructions explained to pt/ verbalized an understanding / iv and tele removed/ transported off unit via wheelchair.  

## 2019-05-19 LAB — CULTURE, BLOOD (ROUTINE X 2)
Culture: NO GROWTH
Culture: NO GROWTH
Special Requests: ADEQUATE
Special Requests: ADEQUATE

## 2019-05-20 ENCOUNTER — Telehealth: Payer: Self-pay

## 2019-05-20 NOTE — Telephone Encounter (Signed)
Attempted to reach patient for TCM call and schedule hospital follow up appt. Left msg for her to contact the office at [phone]. Pt to be seen on 3/29 if possible for hospital follow up appt and will need to push back scheduled CPE on 06/05/19 per PCP.

## 2019-05-21 NOTE — Telephone Encounter (Signed)
Transition Care Management Follow-up Telephone Call  Date of discharge and from where: 05/17/19 [facility]  How have you been since you were released from the hospital? Pt states she is doing much better  Any questions or concerns? No   Items Reviewed:  Did the pt receive and understand the discharge instructions provided? Yes   Medications obtained and verified? Yes   Any new allergies since your discharge? No   Dietary orders reviewed? Yes  Do you have support at home? Yes   Functional Questionnaire: (I = Independent and D = Dependent) ADLs: I  Bathing/Dressing- I  Meal Prep- I  Eating- I  Maintaining continence- I  Transferring/Ambulation- I  Managing Meds- I  Follow up appointments reviewed:   PCP Hospital f/u appt confirmed? No  - need approval to overbook per provider.   Are transportation arrangements needed? Yes  - pt has to request transportation 5 days in advance for appts  If their condition worsens, is the pt aware to call PCP or go to the Emergency Dept.? Yes  Was the patient provided with contact information for the PCP's office or ED? Yes  Was to pt encouraged to call back with questions or concerns? Yes

## 2019-05-25 NOTE — Telephone Encounter (Signed)
I spoke with the patient and scheduled two appointments for her at [facility] with Dr. Roxan Hockey. She has to call her transportation and see when they are available to pick her up, then she will call to confirm one appointment.

## 2019-05-25 NOTE — Telephone Encounter (Signed)
Ok, for hosp f/u.

## 2019-05-25 NOTE — Telephone Encounter (Signed)
Hi Nicole Austin, will you please check with Dr. Roxan Hockey to see if he is able to see this patient this week for a hospital follow up and contact her to schedule. She has to contact transportation. For hospital follow up she needs to be seen within 14 days of discharge for TCM services to be billed. Also per [name] we need to push back her physical.   Thank you!

## 2019-05-26 NOTE — Telephone Encounter (Signed)
Patient confirmed appointment for April 1 @ 2:00.

## 2019-05-27 NOTE — Progress Notes (Signed)
Name: Nicole Austin   MRN: [id number]    DOB: [birth year]   Date:05/28/2019       Progress Note  Subjective  Chief Complaint  Chief Complaint  Patient presents with  . Hospitalization Follow-up    had a kidney infection    I connected with  Maryjayne Kleven Fryer  on 05/28/19 at  2:00 PM EDT by a video enabled telemedicine application and verified that I am speaking with the correct person using two identifiers.  I discussed the limitations of evaluation and management by telemedicine and the availability of in person appointments. The patient expressed understanding and agreed to proceed. Staff also discussed with the patient that there may be a patient responsible charge related to this service. Patient Location: Home Provider Location: [facility] Additional Individuals present: none Tried to connect with patient, and she did not respond for over 3 minutes.  Followed up with a phone call, and she agreed to continue the visit by phone. HPI  Patient is a 59 year old female who I first met via televisit May 14, 2019 and referred her to the emergency room due to concerns for pyelonephritis. Her past medical history is significant forCOPD/asthma, tobacco abuse,hyperlipidemia, glucose intolerance, sleep apnea,GERD,Parkinson's disease, RLS, tremors, and bipolar disorder with depression and anxiety.  She was admitted to the hospital 05/14/2019, and discharged 05/17/2019, and follows up today for a post hospital visit.  Her discharge summary: She presented to the hospital with left flank pain, fever and chills for about 4 days prior to admission. She also had urinary urgency and hesitancy. She had poor appetite and vomited once. She presented to the emergency room at the behest of her primary care physician after she was evaluated by PCP via tele consult. She was admitted to the hospital for acute kidney injury and sepsis secondary to acute left pyelonephritis.  She was treated with  analgesics, antiemetics, empiric IV antibiotics and IV fluids.  Urine culture showed E. coli and she was switched from IV Rocephin to oral Levaquin at discharge.  Her condition has improved and she is deemed stable for discharge to home.  Since discharge, doing good.  Notes feel tired, no other problems. Eating better, lost a little weight with her recent illness. Getting meals from [facility] and that has been helpful.. No CP, SOB, no increased leg swelling, urinating back to normal, no pain, no blood.  No flank pains. No fevers, chills, no concerns for Covid.  Lives alone with cat, almost lost cat this past week after an illness. Increased stress and not sleep as well as a result, better now as cat better and can relax more. Plans to get Covid vaccine, made appt with pharmacy.  And encouraged her to get.  Patient Active Problem List   Diagnosis Date Noted  . Acute pyelonephritis, left 05/15/2019  . AKI (acute kidney injury) ([place]) 05/15/2019  . UTI (urinary tract infection) 05/14/2019  . Benign neoplasm of ascending colon   . Polyp of sigmoid colon   . Bipolar disorder with depression ([place]) 02/25/2017  . ADD (attention deficit disorder) 02/25/2017  . Marijuana use 02/13/2017  . Neuropathy, peripheral 05/07/2016  . Vitamin B12 deficiency 10/24/2015  . Fibromyalgia 08/30/2015  . Low HDL (under 40) 08/30/2015  . GERD (gastroesophageal reflux disease) 08/30/2015  . OP (osteoporosis) 06/30/2015  . Sleep apnea 06/30/2015  . Morbid (severe) obesity due to excess calories ([place]) 08/16/2014  . Low back pain with sciatica 08/04/2014  . Current tobacco  use 08/04/2014  . Polysubstance abuse ([place]) 07/04/2014  . Psychoactive substance abuse ([place]) 07/04/2014  . Anxiety, generalized 11/24/2013  . COPD with asthma ([place]) 11/18/2013  . Arthritis of knee, degenerative 07/15/2013    Past Surgical History:  Procedure Laterality Date  . BACK SURGERY    . CATARACT EXTRACTION W/PHACO Right  01/01/2019   Procedure: CATARACT EXTRACTION PHACO AND INTRAOCULAR LENS PLACEMENT (IOC) right vision blue;  Surgeon: Marchia Meiers, MD;  Location: [facility] ORS;  Service: Ophthalmology;  Laterality: Right;  [place] 00:39.4 CDE 5.49 Fluid Pack lot # [id number]  . CATARACT EXTRACTION W/PHACO Left 01/29/2019   Procedure: CATARACT EXTRACTION PHACO AND INTRAOCULAR LENS PLACEMENT ([place]) LEFT Vision Blue;  Surgeon: Marchia Meiers, MD;  Location: [facility] ORS;  Service: Ophthalmology;  Laterality: Left;  [place] 00:51.1 CDE 4.38 Fluid Pack Lot # [id number]  . COLONOSCOPY WITH PROPOFOL N/A 08/07/2017   Procedure: COLONOSCOPY WITH PROPOFOL;  Surgeon: Virgel Manifold, MD;  Location: [facility] ENDOSCOPY;  Service: Endoscopy;  Laterality: N/A;  . EYE SURGERY    . JOINT REPLACEMENT Left 2016   tkr  . REPLACEMENT TOTAL KNEE Left 2016    Family History  Problem Relation Age of Onset  . Hernia Mother   . Heart disease Mother   . OCD Mother   . Diabetes Mother   . Parkinson's disease Father   . Bipolar disorder Sister   . Schizophrenia Sister   . ADD / ADHD Sister   . Alcohol abuse Brother   . Bipolar disorder Sister   . Paranoid behavior Sister   . ADD / ADHD Sister   . ADD / ADHD Son   . Dementia Maternal Grandmother   . Emphysema Maternal Grandfather   . ADD / ADHD Son   . ADD / ADHD Son   . Depression Son     Social History   Tobacco Use  . Smoking status: Current Every Day Smoker    Packs/day: 1.00    Years: 36.00    Pack years: 36.00    Types: Cigarettes  . Smokeless tobacco: Never Used  Substance Use Topics  . Alcohol use: No    Alcohol/week: 0.0 standard drinks     Current Outpatient Medications:  .  ALPRAZolam (XANAX) 0.5 MG tablet, Take 0.5 mg by mouth 4 (four) times daily. , Disp: , Rfl:  .  benztropine (COGENTIN) 1 MG tablet, Take 1 mg by mouth at bedtime., Disp: , Rfl:  .  divalproex (DEPAKOTE) 250 MG DR tablet, Take 1 tablet (250 mg total) by mouth 2 (two) times daily., Disp: 60 tablet, Rfl:  2 .  escitalopram (LEXAPRO) 20 MG tablet, Take 1 tablet (20 mg total) by mouth daily., Disp: 30 tablet, Rfl: 2 .  LATUDA 60 MG TABS, Take 60 mg by mouth at bedtime. , Disp: , Rfl:  .  levofloxacin (LEVAQUIN) 750 MG tablet, Take 1 tablet (750 mg total) by mouth every other day., Disp: 3 tablet, Rfl: 0 .  meloxicam (MOBIC) 15 MG tablet, Take 15 mg by mouth daily., Disp: , Rfl:  .  methocarbamol (ROBAXIN) 500 MG tablet, Take 500 mg by mouth in the morning and at bedtime., Disp: , Rfl:  .  Multiple Vitamin (MULTIVITAMIN WITH MINERALS) TABS tablet, Take 1 tablet by mouth daily., Disp: , Rfl:  .  promethazine (PHENERGAN) 25 MG tablet, Take 25 mg by mouth 2 (two) times daily as needed for nausea or vomiting., Disp: , Rfl:  .  rOPINIRole (REQUIP) 4 MG tablet, Take  4 mg by mouth at bedtime. , Disp: , Rfl:  .  tiZANidine (ZANAFLEX) 2 MG tablet, Take 2 tablets (4 mg total) by mouth 3 (three) times daily as needed for muscle spasms., Disp: , Rfl:  .  umeclidinium-vilanterol (ANORO ELLIPTA) 62.5-25 MCG/INH AEPB, Inhale 1 puff into the lungs daily., Disp: 30 each, Rfl: 11  Allergies  Allergen Reactions  . Chantix [Varenicline] Nausea Only  . Codeine Nausea And Vomiting    With staff assistance, above reviewed with the patient today.   ROS: As per HPI, otherwise no specific complaints on a limited and focused system review   Objective Ht 5' 1" (1.549 m)   Wt 220 lb (99.8 kg)   LMP  (LMP Unknown)   BMI 41.57 kg/m   Virtual encounter, vitals not obtained.  Body mass index is 41.57 kg/m. Wt Readings from Last 3 Encounters:  05/28/19 220 lb (99.8 kg)  05/17/19 225 lb 6.4 oz (102.2 kg)  05/14/19 225 lb (102.1 kg)   Physical Exam  Patient in NAD, very pleasant Breathing: Effort normal. No respiratory distress. Speaking in complete sentences Neurological: Pt is alert and oriented,  Speech is normal.  Psychiatric: Patient has a normal mood and affect. Very appropriate with conversation, judgment  and thought content normal.   No results found for this or any previous visit (from the past 72 hour(s)).  PHQ2/9: Depression screen [facility] 2/9 05/28/2019 05/14/2019 03/17/2019 12/23/2018 12/03/2018  Decreased Interest 0 _0 Down, Depressed, Hopeless _1 PHQ - 2 Score _2 Altered sleeping _3 Tired, decreased energy _4 Change in appetite 1 3 0 2 3  Feeling bad or failure about yourself  0 0 0 1 3  Trouble concentrating _5 Moving slowly or fidgety/restless 0 0 1 0 0  Suicidal thoughts 0 0 0 0 0  PHQ-9 Score _6 Difficult doing work/chores Somewhat difficult Not difficult at all Not difficult at all - Very difficult  Some recent data might be hidden   PHQ-2/9 Result reviewed   Fall Risk: Fall Risk  05/28/2019 05/14/2019 03/17/2019 12/03/2018 10/14/2018  Falls in the past year? 0 0 1 0 0  Comment - - - - -  Number falls in past yr: 0 0 1 0 0  Comment - - slipped on steps - -  Injury with Fall? 0 0 0 0 0  Comment - - - - -  Risk Factor Category  - - - - -  Risk for fall due to : - - History of fall(s) - -  Follow up - - Falls prevention discussed - -  Comment - - - - -     Assessment & Plan 1. Encounter for examination following treatment at hospital Patient home and much improved after her hospital admission for pyelonephritis.  2. H/O pyelonephritis No concerns presently, with no flank pains, fevers, dysuria. Is trying to stay well-hydrated, and encouraged that today.   3. Anxiety, generalized Increased symptoms with the illness of her cat, cause some insomnia issues, and in the last day, she notes her cat is now improved, and she feels much better.  She notes she can now relax, which will help her sleep.  4. Bipolar disorder with depression ([place]) Denies marked depressive symptoms.  Her PHQ-9 was reviewed. Continue her current medications.  Patient noted  she has a follow-up visit in early April, and recommended she keep her  planned scheduled f/u with L.Tapia on 06/05/19  I discussed the assessment and treatment plan with the patient. The patient was provided an opportunity to ask questions and all were answered. The patient agreed with the plan and demonstrated an understanding of the instructions..  The patient was advised to call back or seek an in-person evaluation if the symptoms worsen or if the condition fails to improve as anticipated.  I provided 15 minutes of non-face-to-face time during this encounter that included discussing at length patient's sx/history, pertinent pmhx, medications, treatment and follow up plan. This time also included the necessary documentation, orders, and chart review.

## 2019-05-28 ENCOUNTER — Encounter: Payer: Self-pay | Admitting: Internal Medicine

## 2019-05-28 ENCOUNTER — Ambulatory Visit (INDEPENDENT_AMBULATORY_CARE_PROVIDER_SITE_OTHER): Payer: Medicare Other | Admitting: Internal Medicine

## 2019-05-28 VITALS — Ht 61.0 in | Wt 220.0 lb

## 2019-05-28 DIAGNOSIS — Z87448 Personal history of other diseases of urinary system: Secondary | ICD-10-CM

## 2019-05-28 DIAGNOSIS — F411 Generalized anxiety disorder: Secondary | ICD-10-CM | POA: Diagnosis not present

## 2019-05-28 DIAGNOSIS — F319 Bipolar disorder, unspecified: Secondary | ICD-10-CM

## 2019-05-28 DIAGNOSIS — Z09 Encounter for follow-up examination after completed treatment for conditions other than malignant neoplasm: Secondary | ICD-10-CM | POA: Diagnosis not present

## 2019-05-29 ENCOUNTER — Ambulatory Visit: Payer: Self-pay | Admitting: Internal Medicine

## 2019-06-05 ENCOUNTER — Encounter: Payer: Medicare Other | Admitting: Family Medicine

## 2019-06-06 ENCOUNTER — Other Ambulatory Visit: Payer: Self-pay | Admitting: Family Medicine

## 2019-06-18 ENCOUNTER — Encounter: Payer: Medicare Other | Admitting: Family Medicine

## 2019-07-02 ENCOUNTER — Encounter: Payer: Self-pay | Admitting: Family Medicine

## 2019-07-02 ENCOUNTER — Other Ambulatory Visit: Payer: Self-pay

## 2019-07-02 ENCOUNTER — Encounter (INDEPENDENT_AMBULATORY_CARE_PROVIDER_SITE_OTHER): Payer: Medicare Other | Admitting: Family Medicine

## 2019-07-02 NOTE — Progress Notes (Signed)
Name: Nicole Austin   MRN: [id number]    DOB: 23-Aug-1960   Date:07/02/2019       Progress Note  Subjective:    Chief Complaint  Chief Complaint  Patient presents with  . Hospitalization Follow-up  . Blood Infection    pt states feeling much better    I connected with  Jorian Moore Duquette  on 07/02/19 at  2:20 PM EDT by a video enabled telemedicine application and verified that I am speaking with the correct person using two identifiers.  I discussed the limitations of evaluation and management by telemedicine and the availability of in person appointments. The patient expressed understanding and agreed to proceed. Staff also discussed with the patient that there may be a patient responsible charge related to this service. Patient Location:  Provider Location:  Additional Individuals present:    HPI   Patient Active Problem List   Diagnosis Date Noted  . Acute pyelonephritis, left 05/15/2019  . AKI (acute kidney injury) ([place]) 05/15/2019  . UTI (urinary tract infection) 05/14/2019  . Benign neoplasm of ascending colon   . Polyp of sigmoid colon   . Bipolar disorder with depression ([place]) 02/25/2017  . ADD (attention deficit disorder) 02/25/2017  . Marijuana use 02/13/2017  . Neuropathy, peripheral 05/07/2016  . Vitamin B12 deficiency 10/24/2015  . Fibromyalgia 08/30/2015  . Low HDL (under 40) 08/30/2015  . GERD (gastroesophageal reflux disease) 08/30/2015  . OP (osteoporosis) 06/30/2015  . Sleep apnea 06/30/2015  . Morbid (severe) obesity due to excess calories ([place]) 08/16/2014  . Low back pain with sciatica 08/04/2014  . Current tobacco use 08/04/2014  . Polysubstance abuse ([place]) 07/04/2014  . Psychoactive substance abuse ([place]) 07/04/2014  . Anxiety, generalized 11/24/2013  . COPD with asthma ([place]) 11/18/2013  . Arthritis of knee, degenerative 07/15/2013    Social History   Tobacco Use  . Smoking status: Current Every Day Smoker    Packs/day: 1.00   Years: 36.00    Pack years: 36.00    Types: Cigarettes  . Smokeless tobacco: Never Used  Substance Use Topics  . Alcohol use: No    Alcohol/week: 0.0 standard drinks     Current Outpatient Medications:  .  ALPRAZolam (XANAX) 0.5 MG tablet, Take 0.5 mg by mouth 4 (four) times daily. , Disp: , Rfl:  .  benztropine (COGENTIN) 1 MG tablet, Take 1 mg by mouth at bedtime., Disp: , Rfl:  .  divalproex (DEPAKOTE) 250 MG DR tablet, Take 1 tablet (250 mg total) by mouth 2 (two) times daily., Disp: 60 tablet, Rfl: 2 .  escitalopram (LEXAPRO) 20 MG tablet, Take 1 tablet (20 mg total) by mouth daily., Disp: 30 tablet, Rfl: 2 .  LATUDA 60 MG TABS, Take 60 mg by mouth at bedtime. , Disp: , Rfl:  .  levofloxacin (LEVAQUIN) 750 MG tablet, Take 1 tablet (750 mg total) by mouth every other day., Disp: 3 tablet, Rfl: 0 .  meloxicam (MOBIC) 15 MG tablet, Take 15 mg by mouth daily., Disp: , Rfl:  .  methocarbamol (ROBAXIN) 500 MG tablet, Take 500 mg by mouth in the morning and at bedtime., Disp: , Rfl:  .  Multiple Vitamin (MULTIVITAMIN WITH MINERALS) TABS tablet, Take 1 tablet by mouth daily., Disp: , Rfl:  .  promethazine (PHENERGAN) 25 MG tablet, Take 25 mg by mouth 2 (two) times daily as needed for nausea or vomiting., Disp: , Rfl:  .  rOPINIRole (REQUIP) 4 MG tablet, Take 4 mg by  mouth at bedtime. , Disp: , Rfl:  .  tiZANidine (ZANAFLEX) 2 MG tablet, Take 2 tablets (4 mg total) by mouth 3 (three) times daily as needed for muscle spasms., Disp: , Rfl:  .  umeclidinium-vilanterol (ANORO ELLIPTA) 62.5-25 MCG/INH AEPB, Inhale 1 puff into the lungs daily., Disp: 30 each, Rfl: 11  Allergies  Allergen Reactions  . Chantix [Varenicline] Nausea Only  . Codeine Nausea And Vomiting      Review of Systems    Objective:   Virtual encounter, vitals limited, only able to obtain the following Today's Vitals   07/02/19 1504  Weight: 220 lb (99.8 kg)  Height: 5\' 1"  (1.549 m)   Body mass index is 41.57  kg/m. Nursing Note and Vital Signs reviewed.  Physical Exam  PE limited by telephone encounter  No results found for this or any previous visit (from the past 72 hour(s)).  Assessment and Plan:   No diagnosis found.   -Red flags and when to present for emergency care or RTC including fever >101.8F, chest pain, shortness of breath, new/worsening/un-resolving symptoms, reviewed with patient at time of visit. Follow up and care instructions discussed and provided in AVS. - I discussed the assessment and treatment plan with the patient. The patient was provided an opportunity to ask questions and all were answered. The patient agreed with the plan and demonstrated an understanding of the instructions.  I provided  minutes of non-face-to-face time during this encounter.  Delsa Grana, PA-C 07/02/19 3:28 PM

## 2019-07-24 ENCOUNTER — Other Ambulatory Visit: Payer: Self-pay | Admitting: Family Medicine

## 2019-07-30 ENCOUNTER — Other Ambulatory Visit: Payer: Self-pay | Admitting: Family Medicine

## 2019-07-30 NOTE — Telephone Encounter (Signed)
Pt has an appt with leisa on 08-06-2019 and would like a refill on tizanidine. [facility]. Pt will also discuss medication with provider

## 2019-07-31 MED ORDER — TIZANIDINE HCL 2 MG PO TABS: 2.0000 mg | ORAL_TABLET | Freq: Three times a day (TID) | ORAL | 1 refills | Status: DC | PRN

## 2019-08-06 ENCOUNTER — Ambulatory Visit (INDEPENDENT_AMBULATORY_CARE_PROVIDER_SITE_OTHER): Payer: Medicare Other | Admitting: Family Medicine

## 2019-08-06 ENCOUNTER — Other Ambulatory Visit: Payer: Self-pay

## 2019-08-06 ENCOUNTER — Encounter: Payer: Self-pay | Admitting: Family Medicine

## 2019-08-06 VITALS — BP 122/68 | HR 90 | Temp 97.8°F | Resp 16 | Ht 61.0 in | Wt 218.5 lb

## 2019-08-06 DIAGNOSIS — J449 Chronic obstructive pulmonary disease, unspecified: Secondary | ICD-10-CM

## 2019-08-06 DIAGNOSIS — Z1211 Encounter for screening for malignant neoplasm of colon: Secondary | ICD-10-CM | POA: Diagnosis not present

## 2019-08-06 DIAGNOSIS — Z720 Tobacco use: Secondary | ICD-10-CM

## 2019-08-06 DIAGNOSIS — Z1231 Encounter for screening mammogram for malignant neoplasm of breast: Secondary | ICD-10-CM | POA: Diagnosis not present

## 2019-08-06 DIAGNOSIS — F319 Bipolar disorder, unspecified: Secondary | ICD-10-CM

## 2019-08-06 DIAGNOSIS — G8929 Other chronic pain: Secondary | ICD-10-CM

## 2019-08-06 DIAGNOSIS — Z0000 Encounter for general adult medical examination without abnormal findings: Secondary | ICD-10-CM | POA: Diagnosis not present

## 2019-08-06 DIAGNOSIS — Z780 Asymptomatic menopausal state: Secondary | ICD-10-CM

## 2019-08-06 DIAGNOSIS — M797 Fibromyalgia: Secondary | ICD-10-CM

## 2019-08-06 DIAGNOSIS — M549 Dorsalgia, unspecified: Secondary | ICD-10-CM

## 2019-08-06 LAB — COMPLETE METABOLIC PANEL WITH GFR
AG Ratio: 1.6 (calc) (ref 1.0–2.5)
ALT: 11 U/L (ref 6–29)
AST: 18 U/L (ref 10–35)
Albumin: 4.1 g/dL (ref 3.6–5.1)
Alkaline phosphatase (APISO): 69 U/L (ref 37–153)
BUN/Creatinine Ratio: 7 (calc) (ref 6–22)
BUN: 9 mg/dL (ref 7–25)
CO2: 29 mmol/L (ref 20–32)
Calcium: 9.9 mg/dL (ref 8.6–10.4)
Chloride: 97 mmol/L — ABNORMAL LOW (ref 98–110)
Creat: 1.29 mg/dL — ABNORMAL HIGH (ref 0.50–0.99)
GFR, Est African American: 52 mL/min/{1.73_m2} — ABNORMAL LOW (ref >=60)
GFR, Est Non African American: 45 mL/min/{1.73_m2} — ABNORMAL LOW (ref >=60)
Globulin: 2.6 g/dL (calc) (ref 1.9–3.7)
Glucose, Bld: 75 mg/dL (ref 65–99)
Potassium: 4.5 mmol/L (ref 3.5–5.3)
Sodium: 136 mmol/L (ref 135–146)
Total Bilirubin: 0.3 mg/dL (ref 0.2–1.2)
Total Protein: 6.7 g/dL (ref 6.1–8.1)

## 2019-08-06 LAB — LIPID PANEL
Cholesterol: 193 mg/dL (ref <200)
HDL: 35 mg/dL — ABNORMAL LOW (ref >=50)
LDL Cholesterol (Calc): 118 mg/dL (calc) — ABNORMAL HIGH
Non-HDL Cholesterol (Calc): 158 mg/dL (calc) — ABNORMAL HIGH (ref <130)
Total CHOL/HDL Ratio: 5.5 (calc) — ABNORMAL HIGH (ref <5.0)
Triglycerides: 299 mg/dL — ABNORMAL HIGH (ref <150)

## 2019-08-06 LAB — CBC WITH DIFFERENTIAL/PLATELET
Absolute Monocytes: 723 cells/uL (ref 200–950)
Basophils Absolute: 51 cells/uL (ref 0–200)
Basophils Relative: 0.6 %
Eosinophils Absolute: 723 cells/uL — ABNORMAL HIGH (ref 15–500)
Eosinophils Relative: 8.5 %
HCT: 41.4 % (ref 35.0–45.0)
Hemoglobin: 13.8 g/dL (ref 11.7–15.5)
Lymphs Abs: 2933 cells/uL (ref 850–3900)
MCH: 30.8 pg (ref 27.0–33.0)
MCHC: 33.3 g/dL (ref 32.0–36.0)
MCV: 92.4 fL (ref 80.0–100.0)
MPV: 11.3 fL (ref 7.5–12.5)
Monocytes Relative: 8.5 %
Neutro Abs: 4072 cells/uL (ref 1500–7800)
Neutrophils Relative %: 47.9 %
Platelets: 232 10*3/uL (ref 140–400)
RBC: 4.48 10*6/uL (ref 3.80–5.10)
RDW: 12.6 % (ref 11.0–15.0)
Total Lymphocyte: 34.5 %
WBC: 8.5 10*3/uL (ref 3.8–10.8)

## 2019-08-06 MED ORDER — TRELEGY ELLIPTA 100-62.5-25 MCG/INH IN AEPB: 1.0000 | INHALATION_SPRAY | Freq: Every day | RESPIRATORY_TRACT | 11 refills | Status: DC

## 2019-08-06 MED ORDER — ALBUTEROL SULFATE HFA 108 (90 BASE) MCG/ACT IN AERS: 2.0000 | INHALATION_SPRAY | RESPIRATORY_TRACT | 2 refills | Status: DC | PRN

## 2019-08-06 MED ORDER — TIZANIDINE HCL 2 MG PO TABS: 2.0000 mg | ORAL_TABLET | Freq: Three times a day (TID) | ORAL | 1 refills | Status: DC | PRN

## 2019-08-06 NOTE — Progress Notes (Signed)
Patient: Nicole Austin, Female    DOB: 09-12-1960, 59 y.o.   MRN: [id number] Delsa Grana, PA-C Visit Date: 08/06/2019  Today's Provider: Delsa Grana, PA-C   Chief Complaint  Patient presents with  . Annual Exam   Subjective:   Annual physical exam:  Nicole Austin is a 59 y.o. female who presents today for complete physical exam:  Dr Wende Crease and therapist manages psych meds- on chronic benzos - xanax 0.5 #120 per month - controlled substance/PDMP reviewed   Exercise/Activity:  Not active due to ortho issues and chronic pain/FMS - doesn't get a lot of exercise  Mostly back pain, no recent back specialist - two past surgeries Diet/nutrition:   Not good diet, tough to get good foods, doesn't cook meals as much , buys a lot of fruits  Sleep:  Not sleeping through the night, wakes up several times -    USPSTF grade A and B recommendations - reviewed and addressed today  Depression:  Phq 9 completed today by patient, was reviewed by me with patient in the room PHQ score is positive, pt feels good On xanax 0.5 mg 120 prescribed by Dr. Kasandra Knudsen On latuda, lexapro, cogentin and depakote    PHQ 2/9 Scores 08/06/2019 07/02/2019 05/28/2019 05/14/2019  PHQ - 2 Score 5 0 1 2  PHQ- 9 Score 17 0 5 8  Exception Documentation - - - -   Depression screen [facility] 2/9 08/06/2019 07/02/2019 05/28/2019 05/14/2019 03/17/2019  Decreased Interest 3 0 0 1 3  Down, Depressed, Hopeless 2 0 _0 PHQ - 2 Score 5 0 _1 Altered sleeping 3 0 _2 Tired, decreased energy 3 0 _3 Change in appetite 3 0 1 3 0  Feeling bad or failure about yourself  0 0 0 0 0  Trouble concentrating 3 0 _4 Moving slowly or fidgety/restless 0 0 0 0 1  Suicidal thoughts 0 0 0 0 0  PHQ-9 Score 17 0 _5 Difficult doing work/chores Somewhat difficult Not difficult at all Somewhat difficult Not difficult at all Not difficult at all  Some recent data might be hidden    Alcohol screening:   Office Visit  from 08/06/2019 in [facility]  AUDIT-C Score 0      Immunizations and Health Maintenance: Health Maintenance  Topic Date Due  . MAMMOGRAM  Never done  . COLONOSCOPY  08/08/2018  . INFLUENZA VACCINE  09/27/2019  . PAP SMEAR-Modifier  05/01/2021  . TETANUS/TDAP  12/12/2025  . COVID-19 Vaccine  Completed  . Hepatitis C Screening  Completed  . HIV Screening  Completed     Hep C Screening: done previously  STD testing and prevention (HIV/chl/gon/syphilis):  see above, no additional testing desired by pt today  Intimate partner violence:safe at home, lives alone  Sexual History/Pain during Intercourse: Single  Menstrual History/LMP/Abnormal Bleeding: no abnormal bleeding No LMP recorded (lmp unknown). Patient is postmenopausal. Went through menopause in late 30's   Incontinence Symptoms: none  Breast cancer: due Last Mammogram: see HM list above BRCA gene screening:   Cervical cancer screening: not due to 2023 Pt family hx of cancers - breast, ovarian, uterine, colon:     Osteoporosis:   Discussion on osteoporosis per age, including high calcium and vitamin D supplementation, weight bearing exercises Pt is not supplementing with daily calcium/Vit   Skin cancer:  Hx of skin CA -  NO Discussed atypical lesions   Colorectal cancer:   Colonoscopy is due Discussed concerning signs and sx of CRC, pt denies melena, hematochezia   Lung cancer:  Working with the lung cancer screening team - transportation issues needs to reschedule  Low Dose CT Chest recommended if Age 72-80 years, 30 pack-year currently smoking OR have quit w/in 15years. Patient does qualify.    Social History   Tobacco Use  . Smoking status: Current Every Day Smoker    Packs/day: 1.00    Years: 36.00    Pack years: 36.00    Types: Cigarettes  . Smokeless tobacco: Never Used  Vaping Use  . Vaping Use: Former  Substance Use Topics  . Alcohol use: No    Alcohol/week: 0.0 standard  drinks  . Drug use: No       [facility] Visit from 08/06/2019 in [facility]  AUDIT-C Score 0      Family History  Problem Relation Age of Onset  . Hernia Mother   . Heart disease Mother   . OCD Mother   . Diabetes Mother   . Parkinson's disease Father   . Bipolar disorder Sister   . Schizophrenia Sister   . ADD / ADHD Sister   . Alcohol abuse Brother   . Bipolar disorder Sister   . Paranoid behavior Sister   . ADD / ADHD Sister   . ADD / ADHD Son   . Dementia Maternal Grandmother   . Emphysema Maternal Grandfather   . ADD / ADHD Son   . ADD / ADHD Son   . Depression Son      Blood pressure/Hypertension: BP Readings from Last 3 Encounters:  08/06/19 122/68  05/17/19 130/71  01/29/19 135/73    Weight/Obesity: Wt Readings from Last 3 Encounters:  08/06/19 218 lb 8 oz (99.1 kg)  07/02/19 220 lb (99.8 kg)  05/28/19 220 lb (99.8 kg)   BMI Readings from Last 3 Encounters:  08/06/19 41.29 kg/m  07/02/19 41.57 kg/m  05/28/19 41.57 kg/m     Lipids:  Lab Results  Component Value Date   CHOL 131 05/02/2018   CHOL 112 02/27/2018   CHOL 125 10/04/2017   Lab Results  Component Value Date   HDL 35 (L) 05/02/2018   HDL 34 (L) 02/27/2018   HDL 33 (L) 10/04/2017   Lab Results  Component Value Date   LDLCALC 77 05/02/2018   LDLCALC 54 02/27/2018   LDLCALC 63 10/04/2017   Lab Results  Component Value Date   TRIG 106 05/02/2018   TRIG 159 (H) 02/27/2018   TRIG 236 (H) 10/04/2017   Lab Results  Component Value Date   CHOLHDL 3.7 05/02/2018   CHOLHDL 3.3 02/27/2018   CHOLHDL 3.8 10/04/2017   No results found for: LDLDIRECT Based on the results of lipid panel his/her cardiovascular risk factor ( using [place] )  in the next 10 years is: The ASCVD Risk score Mikey Bussing DC Jr., et al., 2013) failed to calculate for the following reasons:   The patient has a prior MI or stroke diagnosis Glucose:  Glucose  Date Value Ref Range Status    06/15/2014 92 mg/dL Final    Comment:    65-99 NOTE: New Reference Range  05/04/14   06/14/2014 83 mg/dL Final    Comment:    65-99 NOTE: New Reference Range  05/04/14   10/20/2013 93 65 - 99 mg/dL Final   Glucose, Bld  Date Value Ref Range Status  05/17/2019 104 (H) 70 - 99 mg/dL Final    Comment:    Glucose reference range applies only to samples taken after fasting for at least 8 hours.  05/16/2019 100 (H) 70 - 99 mg/dL Final    Comment:    Glucose reference range applies only to samples taken after fasting for at least 8 hours.  05/15/2019 107 (H) 70 - 99 mg/dL Final    Comment:    Glucose reference range applies only to samples taken after fasting for at least 8 hours.   Glucose-Capillary  Date Value Ref Range Status  03/25/2015 83 65 - 99 mg/dL Final   Hypertension: BP Readings from Last 3 Encounters:  08/06/19 122/68  05/17/19 130/71  01/29/19 135/73   Obesity: Wt Readings from Last 3 Encounters:  08/06/19 218 lb 8 oz (99.1 kg)  07/02/19 220 lb (99.8 kg)  05/28/19 220 lb (99.8 kg)   BMI Readings from Last 3 Encounters:  08/06/19 41.29 kg/m  07/02/19 41.57 kg/m  05/28/19 41.57 kg/m      Advanced Care Planning:  A voluntary discussion about advance care planning including the explanation and discussion of advance directives.   Discussed health care proxy and Living will, and the patient was able to identify a health care proxy as her sister Patient does not have a living will at present time.   Social History      She        Social History   Socioeconomic History  . Marital status: Single    Spouse name: Haynes Dage - sister  . Number of children: 3  . Years of education: Not on file  . Highest education level: 10th grade  Occupational History  . Occupation: Disability  Tobacco Use  . Smoking status: Current Every Day Smoker    Packs/day: 1.00    Years: 36.00    Pack years: 36.00    Types: Cigarettes  . Smokeless tobacco: Never Used   Vaping Use  . Vaping Use: Former  Substance and Sexual Activity  . Alcohol use: No    Alcohol/week: 0.0 standard drinks  . Drug use: No  . Sexual activity: Not Currently  Other Topics Concern  . Not on file  Social History Narrative  . Not on file   Social Determinants of Health   Financial Resource Strain: Medium Risk  . Difficulty of Paying Living Expenses: Somewhat hard  Food Insecurity: No Food Insecurity  . Worried About Charity fundraiser in the Last Year: Never true  . Ran Out of Food in the Last Year: Never true  Transportation Needs: Unmet Transportation Needs  . Lack of Transportation (Medical): Yes  . Lack of Transportation (Non-Medical): Yes  Physical Activity: Inactive  . Days of Exercise per Week: 0 days  . Minutes of Exercise per Session: 0 min  Stress: Stress Concern Present  . Feeling of Stress : Rather much  Social Connections: Unknown  . Frequency of Communication with Friends and Family: Once a week  . Frequency of Social Gatherings with Friends and Family: Once a week  . Attends Religious Services: Never  . Active Member of Clubs or Organizations: No  . Attends Archivist Meetings: Never  . Marital Status: Patient refused    Family History        Family History  Problem Relation Age of Onset  . Hernia Mother   . Heart disease Mother   . OCD Mother   . Diabetes Mother   .  Parkinson's disease Father   . Bipolar disorder Sister   . Schizophrenia Sister   . ADD / ADHD Sister   . Alcohol abuse Brother   . Bipolar disorder Sister   . Paranoid behavior Sister   . ADD / ADHD Sister   . ADD / ADHD Son   . Dementia Maternal Grandmother   . Emphysema Maternal Grandfather   . ADD / ADHD Son   . ADD / ADHD Son   . Depression Son     Patient Active Problem List   Diagnosis Date Noted  . Acute pyelonephritis, left 05/15/2019  . AKI (acute kidney injury) ([place]) 05/15/2019  . UTI (urinary tract infection) 05/14/2019  . Benign neoplasm  of ascending colon   . Polyp of sigmoid colon   . Bipolar disorder with depression ([place]) 02/25/2017  . ADD (attention deficit disorder) 02/25/2017  . Marijuana use 02/13/2017  . Neuropathy, peripheral 05/07/2016  . Vitamin B12 deficiency 10/24/2015  . Fibromyalgia 08/30/2015  . Low HDL (under 40) 08/30/2015  . GERD (gastroesophageal reflux disease) 08/30/2015  . OP (osteoporosis) 06/30/2015  . Sleep apnea 06/30/2015  . Morbid (severe) obesity due to excess calories ([place]) 08/16/2014  . Low back pain with sciatica 08/04/2014  . Current tobacco use 08/04/2014  . Polysubstance abuse ([place]) 07/04/2014  . Psychoactive substance abuse ([place]) 07/04/2014  . Anxiety, generalized 11/24/2013  . COPD with asthma ([place]) 11/18/2013  . Arthritis of knee, degenerative 07/15/2013    Past Surgical History:  Procedure Laterality Date  . BACK SURGERY    . CATARACT EXTRACTION W/PHACO Right 01/01/2019   Procedure: CATARACT EXTRACTION PHACO AND INTRAOCULAR LENS PLACEMENT (IOC) right vision blue;  Surgeon: Marchia Meiers, MD;  Location: [facility] ORS;  Service: Ophthalmology;  Laterality: Right;  [place] 00:39.4 CDE 5.49 Fluid Pack lot # [id number]  . CATARACT EXTRACTION W/PHACO Left 01/29/2019   Procedure: CATARACT EXTRACTION PHACO AND INTRAOCULAR LENS PLACEMENT ([place]) LEFT Vision Blue;  Surgeon: Marchia Meiers, MD;  Location: [facility] ORS;  Service: Ophthalmology;  Laterality: Left;  [place] 00:51.1 CDE 4.38 Fluid Pack Lot # [id number]  . COLONOSCOPY WITH PROPOFOL N/A 08/07/2017   Procedure: COLONOSCOPY WITH PROPOFOL;  Surgeon: Virgel Manifold, MD;  Location: [facility] ENDOSCOPY;  Service: Endoscopy;  Laterality: N/A;  . EYE SURGERY    . JOINT REPLACEMENT Left 2016   tkr  . REPLACEMENT TOTAL KNEE Left 2016     Current Outpatient Medications:  .  ALPRAZolam (XANAX) 0.5 MG tablet, Take 0.5 mg by mouth 4 (four) times daily. , Disp: , Rfl:  .  benztropine (COGENTIN) 1 MG tablet, Take 1 mg by mouth at bedtime., Disp: , Rfl:  .   divalproex (DEPAKOTE) 250 MG DR tablet, Take 1 tablet (250 mg total) by mouth 2 (two) times daily., Disp: 60 tablet, Rfl: 2 .  escitalopram (LEXAPRO) 20 MG tablet, Take 1 tablet (20 mg total) by mouth daily., Disp: 30 tablet, Rfl: 2 .  LATUDA 60 MG TABS, Take 60 mg by mouth at bedtime. , Disp: , Rfl:  .  Multiple Vitamin (MULTIVITAMIN WITH MINERALS) TABS tablet, Take 1 tablet by mouth daily., Disp: , Rfl:  .  promethazine (PHENERGAN) 25 MG tablet, Take 25 mg by mouth 2 (two) times daily as needed for nausea or vomiting., Disp: , Rfl:  .  rOPINIRole (REQUIP) 4 MG tablet, Take 4 mg by mouth at bedtime. , Disp: , Rfl:  .  tiZANidine (ZANAFLEX) 2 MG tablet, Take 1-2 tablets (2-4 mg total) by mouth  3 (three) times daily as needed for muscle spasms., Disp: 60 tablet, Rfl: 1 .  umeclidinium-vilanterol (ANORO ELLIPTA) 62.5-25 MCG/INH AEPB, Inhale 1 puff into the lungs daily., Disp: 30 each, Rfl: 11 .  meloxicam (MOBIC) 15 MG tablet, Take 15 mg by mouth daily. (Patient not taking: Reported on 08/06/2019), Disp: , Rfl:   Allergies  Allergen Reactions  . Chantix [Varenicline] Nausea Only  . Codeine Nausea And Vomiting    Patient Care Team: Delsa Grana, PA-C as PCP - General (Family Medicine) Rainey Pines, MD as Referring Physician (Psychiatry) Virgel Manifold, MD as Consulting Physician (Gastroenterology) Vladimir Crofts, MD as Consulting Physician (Neurology) Vern Claude, LCSW as [place] Management  Review of Systems  Constitutional: Negative.  Negative for activity change, appetite change, fatigue and unexpected weight change.  HENT: Negative.   Eyes: Negative.   Respiratory: Negative.  Negative for shortness of breath.   Cardiovascular: Negative.  Negative for chest pain, palpitations and leg swelling.  Gastrointestinal: Negative.  Negative for abdominal pain and blood in stool.  Endocrine: Negative.   Genitourinary: Negative.   Musculoskeletal: Negative.   Negative for arthralgias, gait problem, joint swelling and myalgias.  Skin: Negative.  Negative for color change, pallor and rash.  Allergic/Immunologic: Negative.   Neurological: Negative.  Negative for syncope and weakness.  Hematological: Negative.   Psychiatric/Behavioral: Negative.  Negative for confusion, dysphoric mood, self-injury and suicidal ideas. The patient is not nervous/anxious.   All other systems reviewed and are negative.      I personally reviewed active problem list, medication list, allergies, family history, social history, health maintenance, notes from last encounter, lab results, imaging with the patient/caregiver today.        Objective:   Vitals:  Vitals:   08/06/19 1435  BP: 122/68  Pulse: 90  Resp: 16  Temp: 97.8 F (36.6 C)  SpO2: 94%  Weight: 218 lb 8 oz (99.1 kg)  Height: '5\' 1"'$  (1.549 m)    Body mass index is 41.29 kg/m.  Physical Exam Vitals and nursing note reviewed.  Constitutional:      General: She is not in acute distress.    Appearance: Normal appearance. She is well-developed. She is obese. She is not ill-appearing, toxic-appearing or diaphoretic.     Interventions: Face mask in place.  HENT:     Head: Normocephalic and atraumatic.     Right Ear: External ear normal.     Left Ear: External ear normal.  Eyes:     General: Lids are normal. No scleral icterus.       Right eye: No discharge.        Left eye: No discharge.     Conjunctiva/sclera: Conjunctivae normal.  Neck:     Thyroid: No thyroid mass, thyromegaly or thyroid tenderness.     Trachea: Phonation normal. No tracheal deviation.  Cardiovascular:     Rate and Rhythm: Normal rate and regular rhythm.     Pulses: Normal pulses.          Radial pulses are 2+ on the right side and 2+ on the left side.       Posterior tibial pulses are 2+ on the right side and 2+ on the left side.     Heart sounds: Normal heart sounds. No murmur heard.  No friction rub. No gallop.     Pulmonary:     Effort: Pulmonary effort is normal. No respiratory distress.     Breath sounds: No stridor.  Wheezing present. No rhonchi or rales.  Chest:     Chest wall: No tenderness.  Abdominal:     General: Bowel sounds are normal. There is no distension.     Palpations: Abdomen is soft.     Tenderness: There is no abdominal tenderness. There is no guarding or rebound.  Musculoskeletal:        General: No deformity.     Cervical back: Normal range of motion and neck supple.     Right lower leg: No edema.     Left lower leg: No edema.  Lymphadenopathy:     Cervical: No cervical adenopathy.  Skin:    General: Skin is warm and dry.     Coloration: Skin is not jaundiced or pale.     Findings: No rash.  Neurological:     Mental Status: She is alert.     Motor: No abnormal muscle tone.     Gait: Gait normal.  Psychiatric:        Mood and Affect: Mood normal.        Speech: Speech normal.        Behavior: Behavior normal.       Fall Risk: Fall Risk  08/06/2019 07/02/2019 05/28/2019 05/14/2019 03/17/2019  Falls in the past year? 0 0 0 0 1  Comment - - - - -  Number falls in past yr: 0 0 0 0 1  Comment - - - - slipped on steps  Injury with Fall? 0 0 0 0 0  Comment - - - - -  Risk Factor Category  - - - - -  Risk for fall due to : - - - - History of fall(s)  Follow up - - - - Falls prevention discussed  Comment - - - - -    Functional Status Survey: Is the patient deaf or have difficulty hearing?: No Does the patient have difficulty seeing, even when wearing glasses/contacts?: No Does the patient have difficulty concentrating, remembering, or making decisions?: No Does the patient have difficulty walking or climbing stairs?: No Does the patient have difficulty dressing or bathing?: No Does the patient have difficulty doing errands alone such as visiting a doctor's office or shopping?: No   Assessment & Plan:    CPE completed today  . USPSTF grade A and B  recommendations reviewed with patient; age-appropriate recommendations, preventive care, screening tests, etc discussed and encouraged; healthy living encouraged; see AVS for patient education given to patient  . Discussed importance of 150 minutes of physical activity weekly, AHA exercise recommendations given to pt in AVS/handout  . Discussed importance of healthy diet:  eating lean meats and proteins, avoiding trans fats and saturated fats, avoid simple sugars and excessive carbs in diet, eat 6 servings of fruit/vegetables daily and drink plenty of water and avoid sweet beverages.    . Recommended pt to do annual eye exam and routine dental exams/cleanings  . Depression, alcohol, fall screening completed as documented above and per flowsheets  . Reviewed Health Maintenance: Health Maintenance  Topic Date Due  . MAMMOGRAM  Never done  . COLONOSCOPY  08/08/2018  . INFLUENZA VACCINE  09/27/2019  . PAP SMEAR-Modifier  05/01/2021  . TETANUS/TDAP  12/12/2025  . COVID-19 Vaccine  Completed  . Hepatitis C Screening  Completed  . HIV Screening  Completed    . Immunizations: Immunization History  Administered Date(s) Administered  . Influenza Split 12/15/2012  . Influenza,inj,Quad PF,6+ Mos 10/27/2013, 12/22/2014, 12/13/2015, 10/16/2016, 05/02/2018  .  Influenza-Unspecified 11/07/2011, 12/15/2012  . Moderna SARS-COVID-2 Vaccination 06/03/2019, 07/15/2019  . Pneumococcal Polysaccharide-23 06/21/2015  . Tdap 12/13/2015  . Zoster Recombinat (Shingrix) 07/25/2018, 10/08/2018      ICD-10-CM   1. Adult general medical exam  Z00.00 CBC with Differential/Platelet    COMPLETE METABOLIC PANEL WITH GFR    Lipid panel  2. Screen for colon cancer  Z12.11 Ambulatory referral to Gastroenterology  3. Encounter for screening mammogram for malignant neoplasm of breast  Z12.31 MM 3D SCREEN BREAST BILATERAL  4. Postmenopausal estrogen deficiency  Z78.0 DG Bone Density  5. Chronic back pain, unspecified  back location, unspecified back pain laterality  M54.9 tiZANidine (ZANAFLEX) 2 MG tablet   G89.29    zanaflex, avoid prolonged periods of immobility - may need physiatry? PT? or f/up with spinal specialist  6. COPD with asthma ([place])  J44.9 Fluticasone-Umeclidin-Vilant (TRELEGY ELLIPTA) 100-62.5-25 MCG/INH AEPB    albuterol (VENTOLIN HFA) 108 (90 Base) MCG/ACT inhaler   poorly controlled, trelegy, smoking cessation, avoid asthma triggers, nebs and/or albuterol inhaler PRN  7. Bipolar disorder with depression ([place])  F31.9    per psych, I have concerns about controlled substances   8. Current tobacco use  Z72.0    tobacco counseling done today, resourced offered  9. Fibromyalgia  M79.7 tiZANidine (ZANAFLEX) 2 MG tablet   pt encouraged to come in for management of this or we can refer to specialist, refill on zanaflex - caution with other sedating meds        Delsa Grana, PA-C 08/06/19 2:46 PM  [facility]

## 2019-08-06 NOTE — Patient Instructions (Addendum)
[facility],  [place]  [address] Get Driving Directions Main: [phone]  Call to get your bone density scan and mammogram done   Preventive Care 43-60 Years Old, Female Preventive care refers to visits with your health care provider and lifestyle choices that can promote health and wellness. This includes:  A yearly physical exam. This may also be called an annual well check.  Regular dental visits and eye exams.  Immunizations.  Screening for certain conditions.  Healthy lifestyle choices, such as eating a healthy diet, getting regular exercise, not using drugs or products that contain nicotine and tobacco, and limiting alcohol use. What can I expect for my preventive care visit? Physical exam Your health care provider will check your:  Height and weight. This may be used to calculate body mass index (BMI), which tells if you are at a healthy weight.  Heart rate and blood pressure.  Skin for abnormal spots. Counseling Your health care provider may ask you questions about your:  Alcohol, tobacco, and drug use.  Emotional well-being.  Home and relationship well-being.  Sexual activity.  Eating habits.  Work and work Statistician.  Method of birth control.  Menstrual cycle.  Pregnancy history. What immunizations do I need?  Influenza (flu) vaccine  This is recommended every year. Tetanus, diphtheria, and pertussis (Tdap) vaccine  You may need a Td booster every 10 years. Varicella (chickenpox) vaccine  You may need this if you have not been vaccinated. Zoster (shingles) vaccine  You may need this after age 68. Measles, mumps, and rubella (MMR) vaccine  You may need at least one dose of MMR if you were born in 1957 or later. You may also need a second dose. Pneumococcal conjugate (PCV13) vaccine  You may need this if you have certain conditions and were not previously  vaccinated. Pneumococcal polysaccharide (PPSV23) vaccine  You may need one or two doses if you smoke cigarettes or if you have certain conditions. Meningococcal conjugate (MenACWY) vaccine  You may need this if you have certain conditions. Hepatitis A vaccine  You may need this if you have certain conditions or if you travel or work in places where you may be exposed to hepatitis A. Hepatitis B vaccine  You may need this if you have certain conditions or if you travel or work in places where you may be exposed to hepatitis B. Haemophilus influenzae type b (Hib) vaccine  You may need this if you have certain conditions. Human papillomavirus (HPV) vaccine  If recommended by your health care provider, you may need three doses over 6 months. You may receive vaccines as individual doses or as more than one vaccine together in one shot (combination vaccines). Talk with your health care provider about the risks and benefits of combination vaccines. What tests do I need? Blood tests  Lipid and cholesterol levels. These may be checked every 5 years, or more frequently if you are over 67 years old.  Hepatitis C test.  Hepatitis B test. Screening  Lung cancer screening. You may have this screening every year starting at age 82 if you have a 30-pack-year history of smoking and currently smoke or have quit within the past 15 years.  Colorectal cancer screening. All adults should have this screening starting at age 60 and continuing until age 7. Your health care provider may recommend screening at age 55 if you are at increased risk. You will have tests every 1-10 years, depending  on your results and the type of screening test.  Diabetes screening. This is done by checking your blood sugar (glucose) after you have not eaten for a while (fasting). You may have this done every 1-3 years.  Mammogram. This may be done every 1-2 years. Talk with your health care provider about when you should start  having regular mammograms. This may depend on whether you have a family history of breast cancer.  BRCA-related cancer screening. This may be done if you have a family history of breast, ovarian, tubal, or peritoneal cancers.  Pelvic exam and Pap test. This may be done every 3 years starting at age 29. Starting at age 64, this may be done every 5 years if you have a Pap test in combination with an HPV test. Other tests  Sexually transmitted disease (STD) testing.  Bone density scan. This is done to screen for osteoporosis. You may have this scan if you are at high risk for osteoporosis. Follow these instructions at home: Eating and drinking  Eat a diet that includes fresh fruits and vegetables, whole grains, lean protein, and low-fat dairy.  Take vitamin and mineral supplements as recommended by your health care provider.  Do not drink alcohol if: ? Your health care provider tells you not to drink. ? You are pregnant, may be pregnant, or are planning to become pregnant.  If you drink alcohol: ? Limit how much you have to 0-1 drink a day. ? Be aware of how much alcohol is in your drink. In the [place] one drink equals one 12 oz bottle of beer (355 mL), one 5 oz glass of wine (148 mL), or one 1 oz glass of hard liquor (44 mL). Lifestyle  Take daily care of your teeth and gums.  Stay active. Exercise for at least 30 minutes on 5 or more days each week.  Do not use any products that contain nicotine or tobacco, such as cigarettes, e-cigarettes, and chewing tobacco. If you need help quitting, ask your health care provider.  If you are sexually active, practice safe sex. Use a condom or other form of birth control (contraception) in order to prevent pregnancy and STIs (sexually transmitted infections).  If told by your health care provider, take low-dose aspirin daily starting at age 32. What's next?  Visit your health care provider once a year for a well check visit.  Ask your health  care provider how often you should have your eyes and teeth checked.  Stay up to date on all vaccines. This information is not intended to replace advice given to you by your health care provider. Make sure you discuss any questions you have with your health care provider. Document Revised: 10/24/2017 Document Reviewed: 10/24/2017 Elsevier Patient Education  2020 [place].   Preventing Osteoporosis, Adult Osteoporosis is a condition that causes the bones to lose density. This means that the bones become thinner, and the normal spaces in bone tissue become larger. Low bone density can make the bones weak and cause them to break more easily. Osteoporosis cannot always be prevented, but you can take steps to lower your risk of developing this condition. How can this condition affect me? If you develop osteoporosis, you will be more likely to break bones in your wrist, spine, or hip. Even a minor accident or injury can be enough to break weak bones. The bones will also be slower to heal. Osteoporosis can cause other problems as well, such as a stooped posture or trouble  with movement. Osteoporosis can occur with aging. As you get older, you may lose bone tissue more quickly, or it may be replaced more slowly. Osteoporosis is more likely to develop if you have poor nutrition or do not get enough calcium or vitamin D. Other lifestyle factors can also play a role. By eating a well-balanced diet and making lifestyle changes, you can help keep your bones strong and healthy, lowering your chances of developing osteoporosis. What can increase my risk? The following factors may make you more likely to develop osteoporosis:  Having a family history of the condition.  Having poor nutrition or not getting enough calcium or vitamin D.  Using certain medicines, such as steroid medicines or antiseizure medicines.  Being any of the following: ? 73 years of age or older. ? Female. ? A woman who has gone  through menopause (is postmenopausal). ? White (Caucasian) or of Asian descent.  Smoking or having a history of smoking.  Not being physically active (being sedentary).  Having a small body frame. What actions can I take to prevent this?  Get enough calcium   Make sure you get enough calcium every day. Calcium is the most important mineral for bone health. Most people can get enough calcium from their diet, but supplements may be recommended for people who are at risk for osteoporosis. Follow these guidelines: ? If you are age 61 or younger, aim to get 1,000 mg of calcium every day. ? If you are older than age 15, aim to get 1,200 mg of calcium every day.  Good sources of calcium include: ? Dairy products, such as low-fat or nonfat milk, cheese, and yogurt. ? Dark green leafy vegetables, such as bok choy and broccoli. ? Foods that have had calcium added to them (calcium-fortified foods), such as orange juice, cereal, bread, soy beverages, and tofu products. ? Nuts, such as almonds.  Check nutrition labels to see how much calcium is in a food or drink. Get enough vitamin D  Try to get enough vitamin D every day. Vitamin D is the most essential vitamin for bone health. It helps the body absorb calcium. Follow these guidelines for how much vitamin D to get from food: ? If you are age 32 or younger, aim to get at least 600 international units (IU) every day. Your health care provider may suggest more. ? If you are older than age 43, aim to get at least 800 international units every day. Your health care provider may suggest more.  Good sources of vitamin D in your diet include: ? Egg yolks. ? Oily fish, such as salmon, sardines, and tuna. ? Milk and cereal fortified with vitamin D.  Your body also makes vitamin D when you are out in the sun. Exposing the bare skin on your face, arms, legs, or back to the sun for no more than 30 minutes a day, 2 times a week is more than enough. Beyond  that, make sure you use sunblock to protect your skin from sunburn, which increases your risk for skin cancer. Exercise  Stay active and get exercise every day.  Ask your health care provider what types of exercise are best for you. Weight-bearing and strength-building activities are important for building and maintaining healthy bones. Some examples of these types of activities include: ? Walking and hiking. ? Jogging and running. ? Dancing. ? Gym exercises. ? Lifting weights. ? Tennis and racquetball. ? Climbing stairs. ? Aerobics. Make other lifestyle changes  Do  not use any products that contain nicotine or tobacco, such as cigarettes, e-cigarettes, and chewing tobacco. If you need help quitting, ask your health care provider.  Lose weight if you are overweight.  If you drink alcohol: ? Limit how much you use to:  0-1 drink a day for nonpregnant women.  0-2 drinks a day for men. ? Be aware of how much alcohol is in your drink. In the [place] one drink equals one 12 oz bottle of beer (355 mL), one 5 oz glass of wine (148 mL), or one 1 oz glass of hard liquor (44 mL). Where to find support If you need help making changes to prevent osteoporosis, talk with your health care provider. You can ask for a referral to a diet and nutrition specialist (dietitian) and a physical therapist. Where to find more information Learn more about osteoporosis from:  NIH Osteoporosis and Related [place]: www.bones.SouthExposed.es  [place] Office on [employer] Health: [place].tn  [place]: EquipmentWeekly.com.ee Summary  Osteoporosis is a condition that causes weak bones that are more likely to break.  Eat a healthy diet, making sure you get enough calcium and vitamin D, and stay active by getting regular exercise to help prevent osteoporosis.  Other ways to reduce your risk of osteoporosis include maintaining a healthy weight and avoiding alcohol and  products that contain nicotine or tobacco. This information is not intended to replace advice given to you by your health care provider. Make sure you discuss any questions you have with your health care provider. Document Revised: 09/12/2018 Document Reviewed: 09/12/2018 Elsevier Patient Education  [place].

## 2019-08-10 ENCOUNTER — Other Ambulatory Visit: Payer: Self-pay | Admitting: Family Medicine

## 2019-08-10 DIAGNOSIS — E786 Lipoprotein deficiency: Secondary | ICD-10-CM

## 2019-08-10 MED ORDER — ATORVASTATIN CALCIUM 40 MG PO TABS: 40.0000 mg | ORAL_TABLET | Freq: Every day | ORAL | 3 refills | Status: DC

## 2019-08-18 ENCOUNTER — Encounter: Payer: Self-pay | Admitting: *Deleted

## 2019-08-24 ENCOUNTER — Encounter: Payer: Self-pay | Admitting: Family Medicine

## 2019-09-03 ENCOUNTER — Telehealth: Payer: Self-pay | Admitting: Family Medicine

## 2019-09-03 NOTE — Telephone Encounter (Signed)
Pt request refill  tiZANidine (ZANAFLEX) 2 MG tablet  Pt states she has been taking 3 X a day, 1-2 tabs, depending on pain.  [place], [place] Phone:  [phone]  Fax:  [phone]

## 2019-10-13 ENCOUNTER — Other Ambulatory Visit: Payer: Self-pay | Admitting: Family Medicine

## 2019-10-13 DIAGNOSIS — G8929 Other chronic pain: Secondary | ICD-10-CM

## 2019-10-13 DIAGNOSIS — M797 Fibromyalgia: Secondary | ICD-10-CM

## 2019-10-15 NOTE — Telephone Encounter (Signed)
Pt called in to follow up on her refill request for tiZANidine (ZANAFLEX) 2 MG tablet     Pharmacy:  [place], [place] Phone:  [phone]  Fax:  [phone]

## 2019-10-15 NOTE — Telephone Encounter (Signed)
Requested Prescriptions  Pending Prescriptions Disp Refills  . tiZANidine (ZANAFLEX) 2 MG tablet [Pharmacy Med Name: TIZANIDINE HCL 2 MG TAB] 60 tablet 1    Sig: TAKE 1 TO 2 TABLETS BY MOUTH 3 TIMES DAILY AS NEEDED FOR MUSLCE SPASMS     Not Delegated - Cardiovascular:  Alpha-2 Agonists - tizanidine Failed - 10/15/2019  2:42 PM      Failed - This refill cannot be delegated      Passed - Valid encounter within last 6 months    Recent Outpatient Visits          2 months ago Adult general medical exam   [facility] Delsa Grana, PA-C   4 months ago Encounter for examination following treatment at hospital   [place], MD   5 months ago Acute left-sided low back pain without sciatica   [place], MD   10 months ago Emotional instability [facility])   [facility] Delsa Grana, PA-C   1 year ago COPD with asthma [facility])   [place], NP      Future Appointments            In 3 weeks Delsa Grana, PA-C [facility] [place]   In 5 months  [facility], [facility]

## 2019-11-09 ENCOUNTER — Ambulatory Visit: Payer: Medicare Other | Admitting: Family Medicine

## 2019-11-25 ENCOUNTER — Other Ambulatory Visit: Payer: Self-pay | Admitting: Family Medicine

## 2019-11-25 DIAGNOSIS — M549 Dorsalgia, unspecified: Secondary | ICD-10-CM

## 2019-11-25 DIAGNOSIS — M797 Fibromyalgia: Secondary | ICD-10-CM

## 2019-12-07 ENCOUNTER — Encounter: Payer: Self-pay | Admitting: Family Medicine

## 2019-12-07 ENCOUNTER — Ambulatory Visit (INDEPENDENT_AMBULATORY_CARE_PROVIDER_SITE_OTHER): Payer: Medicare Other | Admitting: Family Medicine

## 2019-12-07 ENCOUNTER — Other Ambulatory Visit: Payer: Self-pay

## 2019-12-07 VITALS — BP 124/82 | HR 85 | Temp 98.0°F | Resp 16 | Ht 61.0 in | Wt 211.6 lb

## 2019-12-07 DIAGNOSIS — G8929 Other chronic pain: Secondary | ICD-10-CM

## 2019-12-07 DIAGNOSIS — J449 Chronic obstructive pulmonary disease, unspecified: Secondary | ICD-10-CM

## 2019-12-07 DIAGNOSIS — M549 Dorsalgia, unspecified: Secondary | ICD-10-CM

## 2019-12-07 DIAGNOSIS — G2581 Restless legs syndrome: Secondary | ICD-10-CM

## 2019-12-07 DIAGNOSIS — F319 Bipolar disorder, unspecified: Secondary | ICD-10-CM

## 2019-12-07 DIAGNOSIS — E785 Hyperlipidemia, unspecified: Secondary | ICD-10-CM | POA: Diagnosis not present

## 2019-12-07 DIAGNOSIS — J441 Chronic obstructive pulmonary disease with (acute) exacerbation: Secondary | ICD-10-CM

## 2019-12-07 DIAGNOSIS — R197 Diarrhea, unspecified: Secondary | ICD-10-CM

## 2019-12-07 DIAGNOSIS — F17200 Nicotine dependence, unspecified, uncomplicated: Secondary | ICD-10-CM

## 2019-12-07 DIAGNOSIS — G252 Other specified forms of tremor: Secondary | ICD-10-CM

## 2019-12-07 DIAGNOSIS — J4489 Other specified chronic obstructive pulmonary disease: Secondary | ICD-10-CM

## 2019-12-07 LAB — LIPID PANEL
Cholesterol: 117 mg/dL (ref <200)
HDL: 31 mg/dL — ABNORMAL LOW (ref >=50)
LDL Cholesterol (Calc): 58 mg/dL (calc)
Non-HDL Cholesterol (Calc): 86 mg/dL (calc) (ref <130)
Total CHOL/HDL Ratio: 3.8 (calc) (ref <5.0)
Triglycerides: 216 mg/dL — ABNORMAL HIGH (ref <150)

## 2019-12-07 LAB — COMPLETE METABOLIC PANEL WITH GFR
AG Ratio: 1.5 (calc) (ref 1.0–2.5)
ALT: 9 U/L (ref 6–29)
AST: 12 U/L (ref 10–35)
Albumin: 4 g/dL (ref 3.6–5.1)
Alkaline phosphatase (APISO): 70 U/L (ref 37–153)
BUN: 10 mg/dL (ref 7–25)
CO2: 30 mmol/L (ref 20–32)
Calcium: 9.3 mg/dL (ref 8.6–10.4)
Chloride: 103 mmol/L (ref 98–110)
Creat: 0.94 mg/dL (ref 0.50–0.99)
GFR, Est African American: 76 mL/min/{1.73_m2} (ref >=60)
GFR, Est Non African American: 66 mL/min/{1.73_m2} (ref >=60)
Globulin: 2.6 g/dL (calc) (ref 1.9–3.7)
Glucose, Bld: 82 mg/dL (ref 65–99)
Potassium: 4 mmol/L (ref 3.5–5.3)
Sodium: 137 mmol/L (ref 135–146)
Total Bilirubin: 0.5 mg/dL (ref 0.2–1.2)
Total Protein: 6.6 g/dL (ref 6.1–8.1)

## 2019-12-07 MED ORDER — NEBULIZER/TUBING/MOUTHPIECE KIT: PACK | 0 refills | Status: DC

## 2019-12-07 MED ORDER — IPRATROPIUM-ALBUTEROL 0.5-2.5 (3) MG/3ML IN SOLN: 3.0000 mL | Freq: Three times a day (TID) | RESPIRATORY_TRACT | 1 refills | Status: DC | PRN

## 2019-12-07 MED ORDER — PREDNISONE 20 MG PO TABS: ORAL_TABLET | ORAL | 0 refills | Status: DC

## 2019-12-07 NOTE — Patient Instructions (Signed)
F/up neurology - call and get back in - I am submitting your repeated referral  Switch back to spiriva inhaler and follow up with pulmonology

## 2019-12-07 NOTE — Progress Notes (Signed)
Name: Nicole Austin   MRN: [id number]    DOB: August 28, 1960   Date:12/07/2019       Progress Note  Chief Complaint  Patient presents with  . Hyperlipidemia  . Anxiety     Subjective:   Nicole Austin is a 59 y.o. female, presents to clinic for HLD and anxiety  Pt is est with Psychiatry - Dr. Kasandra Knudsen in [facility] - manages anxiety On xanax daily - multiple other meds - records not available per specialist (including but not limited to latuda, requip, cogentin, depakote, lexapro, xanax)  Hyperlipidemia: Currently treated with lipitor 40 mg, pt reports good med compliance Last Lipids: Lab Results  Component Value Date   CHOL 193 08/06/2019   HDL 35 (L) 08/06/2019   LDLCALC 118 (H) 08/06/2019   TRIG 299 (H) 08/06/2019   CHOLHDL 5.5 (H) 08/06/2019   - Denies: Chest pain,, myalgias, claudication Confusing hx in chart of MI and stroke in 2018, but imaging report states negative for stroke.  abd sx x 1 week loose BM 2-3 times a day, denies abd pain, N, V - [t est with GI/[place]  Out of requip for her RLS prescribed by Neuro - she hasn't seen in a while  She complains of fatigue and anxiety - per psychiatry Dr. Kasandra Knudsen PHQ 9 and GAD7 positive reviewed  Depression screen [facility] 2/9 12/07/2019 08/06/2019 07/02/2019  Decreased Interest 3 3 0  Down, Depressed, Hopeless 3 2 0  PHQ - 2 Score 6 5 0  Altered sleeping 3 3 0  Tired, decreased energy 3 3 0  Change in appetite 3 3 0  Feeling bad or failure about yourself  3 0 0  Trouble concentrating 3 3 0  Moving slowly or fidgety/restless 3 0 0  Suicidal thoughts 2 0 0  PHQ-9 Score 26 17 0  Difficult doing work/chores Extremely dIfficult Somewhat difficult Not difficult at all  Some recent data might be hidden   GAD 7 : Generalized Anxiety Score 12/07/2019  Nervous, Anxious, on Edge 3  Control/stop worrying 3  Worry too much - different things 3  Trouble relaxing 3  Restless 3  Easily annoyed or irritable 3  Afraid - awful  might happen 3  Total GAD 7 Score 21  Anxiety Difficulty Extremely difficult   COPD and asthma - she is not using trelegy - it made her mouth taste funny and she doesn't feel like it worked as well at [place] in the past.  She has no nebulizer machine or tx, she does note worse DOE over the past couple months.  She is a current smoker, reports smoking more recently - notes probably some of her worsening dyspnea is due to smoking    Current Outpatient Medications:  .  albuterol (VENTOLIN HFA) 108 (90 Base) MCG/ACT inhaler, Inhale 2 puffs into the lungs every 4 (four) hours as needed for wheezing or shortness of breath., Disp: 18 g, Rfl: 2 .  ALPRAZolam (XANAX) 0.5 MG tablet, Take 0.5 mg by mouth 4 (four) times daily. , Disp: , Rfl:  .  atorvastatin (LIPITOR) 40 MG tablet, Take 1 tablet (40 mg total) by mouth at bedtime., Disp: 90 tablet, Rfl: 3 .  benztropine (COGENTIN) 1 MG tablet, Take 1 mg by mouth at bedtime., Disp: , Rfl:  .  divalproex (DEPAKOTE) 250 MG DR tablet, Take 1 tablet (250 mg total) by mouth 2 (two) times daily., Disp: 60 tablet, Rfl: 2 .  escitalopram (LEXAPRO) 20 MG tablet, Take  1 tablet (20 mg total) by mouth daily., Disp: 30 tablet, Rfl: 2 .  Fluticasone-Umeclidin-Vilant (TRELEGY ELLIPTA) 100-62.5-25 MCG/INH AEPB, Inhale 1 puff into the lungs daily., Disp: 28 each, Rfl: 11 .  LATUDA 60 MG TABS, Take 60 mg by mouth at bedtime. , Disp: , Rfl:  .  Multiple Vitamin (MULTIVITAMIN WITH MINERALS) TABS tablet, Take 1 tablet by mouth daily., Disp: , Rfl:  .  promethazine (PHENERGAN) 25 MG tablet, Take 25 mg by mouth 2 (two) times daily as needed for nausea or vomiting., Disp: , Rfl:  .  rOPINIRole (REQUIP) 4 MG tablet, Take 4 mg by mouth at bedtime. , Disp: , Rfl:  .  tiZANidine (ZANAFLEX) 2 MG tablet, TAKE 1 TO 2 TABLETS BY MOUTH 3 TIMES DAILY AS NEEDED FOR MUSLCE SPASMS, Disp: 60 tablet, Rfl: 1  Patient Active Problem List   Diagnosis Date Noted  . Benign neoplasm of ascending  colon   . Polyp of sigmoid colon   . Bipolar disorder with depression ([place]) 02/25/2017  . ADD (attention deficit disorder) 02/25/2017  . Marijuana use 02/13/2017  . Neuropathy, peripheral 05/07/2016  . Vitamin B12 deficiency 10/24/2015  . Fibromyalgia 08/30/2015  . Low HDL (under 40) 08/30/2015  . GERD (gastroesophageal reflux disease) 08/30/2015  . OP (osteoporosis) 06/30/2015  . Sleep apnea 06/30/2015  . Morbid (severe) obesity due to excess calories ([place]) 08/16/2014  . Low back pain with sciatica 08/04/2014  . Current tobacco use 08/04/2014  . Polysubstance abuse ([place]) 07/04/2014  . Psychoactive substance abuse ([place]) 07/04/2014  . Anxiety, generalized 11/24/2013  . COPD with asthma ([place]) 11/18/2013  . Arthritis of knee, degenerative 07/15/2013  . Osteoarthritis of knee, unspecified 07/15/2013    Past Surgical History:  Procedure Laterality Date  . BACK SURGERY    . CATARACT EXTRACTION W/PHACO Right 01/01/2019   Procedure: CATARACT EXTRACTION PHACO AND INTRAOCULAR LENS PLACEMENT (IOC) right vision blue;  Surgeon: Marchia Meiers, MD;  Location: [facility] ORS;  Service: Ophthalmology;  Laterality: Right;  [place] 00:39.4 CDE 5.49 Fluid Pack lot # [id number]  . CATARACT EXTRACTION W/PHACO Left 01/29/2019   Procedure: CATARACT EXTRACTION PHACO AND INTRAOCULAR LENS PLACEMENT ([place]) LEFT Vision Blue;  Surgeon: Marchia Meiers, MD;  Location: [facility] ORS;  Service: Ophthalmology;  Laterality: Left;  [place] 00:51.1 CDE 4.38 Fluid Pack Lot # [id number]  . COLONOSCOPY WITH PROPOFOL N/A 08/07/2017   Procedure: COLONOSCOPY WITH PROPOFOL;  Surgeon: Virgel Manifold, MD;  Location: [facility] ENDOSCOPY;  Service: Endoscopy;  Laterality: N/A;  . EYE SURGERY    . JOINT REPLACEMENT Left 2016   tkr  . REPLACEMENT TOTAL KNEE Left 2016    Family History  Problem Relation Age of Onset  . Hernia Mother   . Heart disease Mother   . OCD Mother   . Diabetes Mother   . Parkinson's disease Father   . Bipolar disorder  Sister   . Schizophrenia Sister   . ADD / ADHD Sister   . Alcohol abuse Brother   . Bipolar disorder Sister   . Paranoid behavior Sister   . ADD / ADHD Sister   . ADD / ADHD Son   . Dementia Maternal Grandmother   . Emphysema Maternal Grandfather   . ADD / ADHD Son   . ADD / ADHD Son   . Depression Son     Social History   Tobacco Use  . Smoking status: Current Every Day Smoker    Packs/day: 1.00    Years: 36.00  Pack years: 36.00    Types: Cigarettes  . Smokeless tobacco: Never Used  Vaping Use  . Vaping Use: Former  Substance Use Topics  . Alcohol use: No    Alcohol/week: 0.0 standard drinks  . Drug use: No     Allergies  Allergen Reactions  . Chantix [Varenicline] Nausea Only  . Codeine Nausea And Vomiting    Health Maintenance  Topic Date Due  . MAMMOGRAM  Never done  . COLONOSCOPY  08/08/2018  . INFLUENZA VACCINE  09/27/2019  . PAP SMEAR-Modifier  05/01/2021  . TETANUS/TDAP  12/12/2025  . COVID-19 Vaccine  Completed  . Hepatitis C Screening  Completed  . HIV Screening  Completed    Chart Review Today: I personally reviewed active problem list, medication list, allergies, family history, social history, health maintenance, notes from last encounter, lab results, imaging with the patient/caregiver today.   Review of Systems  Constitutional: Negative.  Negative for activity change, chills, diaphoresis and fever.  HENT: Negative.   Eyes: Negative.   Respiratory: Positive for cough, chest tightness, shortness of breath and wheezing.   Cardiovascular: Negative.  Negative for chest pain, palpitations and leg swelling.  Gastrointestinal: Negative.  Negative for abdominal distention, abdominal pain, blood in stool, nausea and vomiting.  Endocrine: Negative.   Genitourinary: Negative.   Musculoskeletal: Negative.   Skin: Negative.  Negative for color change.  Allergic/Immunologic: Negative.   Neurological: Negative.   Hematological: Negative.    Psychiatric/Behavioral: Negative.   All other systems reviewed and are negative.   10 Systems reviewed and are negative for acute change except as noted in the HPI.  .Objective:   Vitals:   12/07/19 1429  BP: 124/82  Pulse: 85  Resp: 16  Temp: 98 F (36.7 C)  TempSrc: Oral  SpO2: 99%  Weight: 211 lb 9.6 oz (96 kg)  Height: 5' 1"  (1.549 m)    Body mass index is 39.98 kg/m.  Physical Exam Vitals and nursing note reviewed.  Constitutional:      Appearance: She is obese. She is not diaphoretic.     Interventions: Face mask in place.     Comments: Chronically ill appearing, nontoxic, NAD  HENT:     Head: Normocephalic and atraumatic.     Right Ear: External ear normal.     Left Ear: External ear normal.  Eyes:     General:        Right eye: No discharge.        Left eye: No discharge.     Conjunctiva/sclera: Conjunctivae normal.  Cardiovascular:     Rate and Rhythm: Normal rate and regular rhythm.  Pulmonary:     Effort: Pulmonary effort is normal. No tachypnea, accessory muscle usage, respiratory distress or retractions.     Breath sounds: Normal breath sounds. No stridor. No rales.     Comments: Diffuse inspiratory and expiratory wheeze in all lung fields Abdominal:     General: Bowel sounds are normal. There is no distension.     Palpations: Abdomen is soft.     Tenderness: There is no abdominal tenderness.  Musculoskeletal:     Cervical back: Normal range of motion and neck supple.  Neurological:     Mental Status: She is alert.  Psychiatric:        Mood and Affect: Mood is depressed.        Behavior: Behavior is cooperative.        Thought Content: Thought content includes suicidal ideation. Thought content does  not include suicidal plan.     Comments: Slowed speech, slightly slurred         Assessment & Plan:     ICD-10-CM   1. Hyperlipidemia, unspecified hyperlipidemia type  E78.5 Lipid panel    COMPLETE METABOLIC PANEL WITH GFR   pt reports she  is compliant with meds, due for labs  2. COPD with asthma ([place])  J44.9 Respiratory Therapy Supplies (NEBULIZER/TUBING/MOUTHPIECE) KIT    predniSONE (DELTASONE) 20 MG tablet    ipratropium-albuterol (DUONEB) 0.5-2.5 (3) MG/3ML SOLN    Ambulatory referral to Pulmonology    Tiotropium Bromide Monohydrate (SPIRIVA RESPIMAT) 2.5 MCG/ACT AERS   uncontrolled, noncompliant with maintenence inhaler, refill on prior inhaler, refer to pulm for further eval  3. Chronic back pain, unspecified back location, unspecified back pain laterality  M54.9    G89.29    pt wants refills on muscle relaxers, she is on chronic benzos, warned of potentiating meds effects and interactions, pt advised not to take with sedating meds  4. Bipolar disorder with depression ([place])  F31.9    chronic condition, uncontrolled, per psych, many meds - encouraged pt to f/up with psych  5. COPD with acute exacerbation ([facility])  J44.1 Respiratory Therapy Supplies (NEBULIZER/TUBING/MOUTHPIECE) KIT    predniSONE (DELTASONE) 20 MG tablet    ipratropium-albuterol (DUONEB) 0.5-2.5 (3) MG/3ML SOLN    Ambulatory referral to Pulmonology    Tiotropium Bromide Monohydrate (SPIRIVA RESPIMAT) 2.5 MCG/ACT AERS   steroids, resume maintenence inhaler, spiriva refilled due to pt preference, she doesn't like trelegy, f/up pulm, likely needs some pfts  6. Resting tremor  G25.2 Ambulatory referral to Neurology   pt notes hx of tremor and wants to f/up with neuro  7. RLS (restless legs syndrome)  G25.81 Ambulatory referral to Neurology   per neuro - lost to f/up, pt states she needs new referral to see again fo rmed refills  8. Current smoker  F17.200    increasing smoking, encouraged to cut back and quit  9. Diarrhea, unspecified type  R19.7    few days of sx, 2-3 loose stools a day, no other associated sx, no abd pain, no hypotension or signs of dehydration     Return in about 6 months (around 06/06/2020) for Routine follow-up HLD, med refills.   Delsa Grana, PA-C 12/07/19 2:39 PM

## 2019-12-08 ENCOUNTER — Encounter: Payer: Self-pay | Admitting: Family Medicine

## 2019-12-08 MED ORDER — SPIRIVA RESPIMAT 2.5 MCG/ACT IN AERS: INHALATION_SPRAY | RESPIRATORY_TRACT | 2 refills | Status: DC

## 2019-12-17 ENCOUNTER — Ambulatory Visit: Payer: Self-pay | Admitting: *Deleted

## 2019-12-17 NOTE — Chronic Care Management (AMB) (Signed)
Patient's CCM status changed to previously enrolled.  Elliot Gurney, [place] Administrator, [facility] [phone]

## 2019-12-17 NOTE — Addendum Note (Signed)
Addended by: Vern Claude on: 12/17/2019 09:51 AM   Modules accepted: Level of Service, SmartSet

## 2019-12-17 NOTE — Progress Notes (Signed)
This encounter was created in error - please disregard.

## 2019-12-17 NOTE — Chronic Care Management (AMB) (Signed)
Patient's CCM status changed to previously enrolled   Nicole Austin, [place] Worker  [facility] Management [phone]

## 2019-12-31 ENCOUNTER — Other Ambulatory Visit: Payer: Self-pay | Admitting: Neurology

## 2019-12-31 ENCOUNTER — Other Ambulatory Visit (HOSPITAL_COMMUNITY): Payer: Self-pay | Admitting: Neurology

## 2019-12-31 DIAGNOSIS — M5416 Radiculopathy, lumbar region: Secondary | ICD-10-CM

## 2020-01-20 ENCOUNTER — Ambulatory Visit: Payer: Medicare Other

## 2020-01-20 ENCOUNTER — Encounter: Payer: Self-pay | Admitting: General Practice

## 2020-01-25 ENCOUNTER — Other Ambulatory Visit: Payer: Self-pay | Admitting: Family Medicine

## 2020-01-25 DIAGNOSIS — G8929 Other chronic pain: Secondary | ICD-10-CM

## 2020-01-25 DIAGNOSIS — M797 Fibromyalgia: Secondary | ICD-10-CM

## 2020-02-02 ENCOUNTER — Other Ambulatory Visit: Payer: Self-pay

## 2020-02-02 ENCOUNTER — Encounter: Payer: Self-pay | Admitting: Internal Medicine

## 2020-02-02 ENCOUNTER — Ambulatory Visit (INDEPENDENT_AMBULATORY_CARE_PROVIDER_SITE_OTHER): Payer: Medicare Other | Admitting: Internal Medicine

## 2020-02-02 ENCOUNTER — Ambulatory Visit: Payer: Self-pay | Admitting: *Deleted

## 2020-02-02 DIAGNOSIS — R059 Cough, unspecified: Secondary | ICD-10-CM | POA: Diagnosis not present

## 2020-02-02 DIAGNOSIS — J449 Chronic obstructive pulmonary disease, unspecified: Secondary | ICD-10-CM | POA: Diagnosis not present

## 2020-02-02 DIAGNOSIS — J441 Chronic obstructive pulmonary disease with (acute) exacerbation: Secondary | ICD-10-CM

## 2020-02-02 DIAGNOSIS — F17200 Nicotine dependence, unspecified, uncomplicated: Secondary | ICD-10-CM | POA: Diagnosis not present

## 2020-02-02 DIAGNOSIS — F319 Bipolar disorder, unspecified: Secondary | ICD-10-CM

## 2020-02-02 MED ORDER — PREDNISONE 20 MG PO TABS: ORAL_TABLET | ORAL | 0 refills | Status: DC

## 2020-02-02 MED ORDER — DOXYCYCLINE HYCLATE 100 MG PO TABS: 100.0000 mg | ORAL_TABLET | Freq: Two times a day (BID) | ORAL | 0 refills | Status: DC

## 2020-02-02 NOTE — Telephone Encounter (Signed)
Tried contacting pt but no answer. Please schedule with Dr Roxan Hockey

## 2020-02-02 NOTE — Telephone Encounter (Addendum)
Pt calling in c/o of shortness of breath and pain in her right chest when she coughs only.   "I have COPD and this weather change does this to me".  I'm ok while I'm sitting but when I get up to do something I feel short of breath.  This has been going on for a couple of days now. "I'm going out of town on Friday and I'm wanting to get this taken care of before I leave".  "I'm not going to the hospital".   "I want to come to the office".    (She is agreeable to a video visit which she has done before via FaceTime).   She is not active on MyChart.  Protocol is to be seen within 24 hours.  There were no openings available so I have sent a high priority note to [facility] requesting they give her a call regarding getting her scheduled if possible.  Pt was agreeable to this plan.  Number listed in chart is correct.  I called the office and spoke with Melissa.  She can be scheduled with Dr. Roxan Hockey for today at 1:20 as a telephone call.  I attempted to call her back 4 times but got her voicemail which is full so I can't leave a message.  I did not schedule her for the 1:20 appt with Dr. Roxan Hockey since I could not get a hold of her to see if that time was good for her.   Reason for Disposition . [1] Longstanding difficulty breathing AND [2] not responding to usual therapy  Answer Assessment - Initial Assessment Questions 1. RESPIRATORY STATUS: "Describe your breathing?" (e.g., wheezing, shortness of breath, unable to speak, severe coughing)      I'm having trouble breathing.   The right side of my chest hurts when I cough only and I'm short of breath. My insurance won't cover nebulizer.  I'm going out of town Friday so I'm not going to a hospital. 2. ONSET: "When did this breathing problem begin?"      A couple days 3. PATTERN "Does the difficult breathing come and go, or has it been constant since it started?"      When sitting I'm fine then I get up to do something I'm very  short of breath.   I'm not coughing up anything.   I'm wheezing.  4. SEVERITY: "How bad is your breathing?" (e.g., mild, moderate, severe)    - MILD: No SOB at rest, mild SOB with walking, speaks normally in sentences, can lay down, no retractions, pulse < 100.    - MODERATE: SOB at rest, SOB with minimal exertion and prefers to sit, cannot lie down flat, speaks in phrases, mild retractions, audible wheezing, pulse 100-120.    - SEVERE: Very SOB at rest, speaks in single words, struggling to breathe, sitting hunched forward, retractions, pulse > 120      See above 5. RECURRENT SYMPTOM: "Have you had difficulty breathing before?" If Yes, ask: "When was the last time?" and "What happened that time?"      About once a year.   The change in the weather. 6. CARDIAC HISTORY: "Do you have any history of heart disease?" (e.g., heart attack, angina, bypass surgery, angioplasty)      I've had a heart attack and a stroke several years ago  7. LUNG HISTORY: "Do you have any history of lung disease?"  (e.g., pulmonary embolus, asthma, emphysema)     COPD 8. CAUSE: "What do  you think is causing the breathing problem?"      The change in the weather. 9. OTHER SYMPTOMS: "Do you have any other symptoms? (e.g., dizziness, runny nose, cough, chest pain, fever)     No fever, nasal congestion on and off. I'm taking Anoro and Mucus relief 12 hour.   These are helping me a lot. 10. PREGNANCY: "Is there any chance you are pregnant?" "When was your last menstrual period?"       N/A 11. TRAVEL: "Have you traveled out of the country in the last month?" (e.g., travel history, exposures)       No  Protocols used: BREATHING DIFFICULTY-A-AH

## 2020-02-02 NOTE — Telephone Encounter (Signed)
FYI

## 2020-02-02 NOTE — Progress Notes (Signed)
Name: Nicole Austin   MRN: [id number]    DOB: August 10, 1960   Date:02/02/2020       Progress Note  Subjective  Chief Complaint  Chief Complaint  Patient presents with  . Shortness of Breath    I connected with  Nicole Austin on 02/02/20 at  1:20 PM EST by telephone and verified that I am speaking with the correct person using two identifiers.  I discussed the limitations, risks, security and privacy concerns of performing an evaluation and management service by telephone and the availability of in person appointments. The patient expressed understanding and agreed to proceed. Staff also discussed with the patient that there may be a patient responsible charge related to this service. Patient Location: Home Provider Location: [facility] Additional Individuals present: none  HPI Patient is a 59 year old female patient of Nicole Austin Last visit with her was in October 2021. She called this morning with the following message noted by the triage nurse:  Pt calling in c/o of shortness of breath and pain in her right chest when she coughs only.   "I have COPD and this weather change does this to me".  I'm ok while I'm sitting but when I get up to do something I feel short of breath.  This has been going on for a couple of days now. "I'm going out of town on Friday and I'm wanting to get this taken care of before I leave".  "I'm not going to the hospital".   "I want to come to the office".    (She is agreeable to a video visit which she has done before via FaceTime).   She is not active on MyChart.    Covid vaccination status-had vaccine, supposed to get booster today and could not go Symptoms started 2 days ago + cough, no marked production, described as a dry cough No marked SOB at rest, is when up and about No fever, T - 98.something this am, occas feeling feverish at times noted Scratchy sore throat in the beginning.  No increased sore throat + congestion, + wheeze intermittently No  loss of smell, loss of taste No N/V No muscle aches No marked loose stools/diarrhea No left sided CP, no passing out episodes, some right sided CP with cough, not otherwise  Taking max strength mucus relief product, cough drops, old ANoro inhaler once daily. Tylenol in addition to her maintenance Spiriva inhaler daily and has a red inhaler (likely albuterol) to use as needed, although has not been using.  Tobacco - current every day smoker, states can't smoke presently due to sx's Comorbid conditions reviewed + asthma/COPD hx -  No h/o DM, heart disease + morbid obesity   She also has a history of anxiety/bipolar depression noted on her last note with [name] as follows:  Pt is est with Psychiatry - Dr. Kasandra Knudsen in [facility] - manages anxiety On xanax daily - multiple other meds - records not available per specialist (including but not limited to latuda, requip, cogentin, depakote, lexapro, xanax)   Patient Active Problem List   Diagnosis Date Noted  . Benign neoplasm of ascending colon   . Polyp of sigmoid colon   . Bipolar disorder with depression ([place]) 02/25/2017  . ADD (attention deficit disorder) 02/25/2017  . Marijuana use 02/13/2017  . Neuropathy, peripheral 05/07/2016  . Vitamin B12 deficiency 10/24/2015  . Fibromyalgia 08/30/2015  . Low HDL (under 40) 08/30/2015  . GERD (gastroesophageal reflux disease) 08/30/2015  . OP (osteoporosis) 06/30/2015  .  Sleep apnea 06/30/2015  . Morbid (severe) obesity due to excess calories ([place]) 08/16/2014  . Low back pain with sciatica 08/04/2014  . Current tobacco use 08/04/2014  . Polysubstance abuse ([place]) 07/04/2014  . Psychoactive substance abuse ([place]) 07/04/2014  . Anxiety, generalized 11/24/2013  . COPD with asthma ([place]) 11/18/2013  . Arthritis of knee, degenerative 07/15/2013  . Osteoarthritis of knee, unspecified 07/15/2013    Past Surgical History:  Procedure Laterality Date  . BACK SURGERY    . CATARACT EXTRACTION W/PHACO  Right 01/01/2019   Procedure: CATARACT EXTRACTION PHACO AND INTRAOCULAR LENS PLACEMENT (IOC) right vision blue;  Surgeon: Marchia Meiers, MD;  Location: [facility] ORS;  Service: Ophthalmology;  Laterality: Right;  [place] 00:39.4 CDE 5.49 Fluid Pack lot # [id number]  . CATARACT EXTRACTION W/PHACO Left 01/29/2019   Procedure: CATARACT EXTRACTION PHACO AND INTRAOCULAR LENS PLACEMENT ([place]) LEFT Vision Blue;  Surgeon: Marchia Meiers, MD;  Location: [facility] ORS;  Service: Ophthalmology;  Laterality: Left;  [place] 00:51.1 CDE 4.38 Fluid Pack Lot # [id number]  . COLONOSCOPY WITH PROPOFOL N/A 08/07/2017   Procedure: COLONOSCOPY WITH PROPOFOL;  Surgeon: Virgel Manifold, MD;  Location: [facility] ENDOSCOPY;  Service: Endoscopy;  Laterality: N/A;  . EYE SURGERY    . JOINT REPLACEMENT Left 2016   tkr  . REPLACEMENT TOTAL KNEE Left 2016    Family History  Problem Relation Age of Onset  . Hernia Mother   . Heart disease Mother   . OCD Mother   . Diabetes Mother   . Parkinson's disease Father   . Bipolar disorder Sister   . Schizophrenia Sister   . ADD / ADHD Sister   . Alcohol abuse Brother   . Bipolar disorder Sister   . Paranoid behavior Sister   . ADD / ADHD Sister   . ADD / ADHD Son   . Dementia Maternal Grandmother   . Emphysema Maternal Grandfather   . ADD / ADHD Son   . ADD / ADHD Son   . Depression Son     Social History   Tobacco Use  . Smoking status: Current Every Day Smoker    Packs/day: 1.00    Years: 36.00    Pack years: 36.00    Types: Cigarettes  . Smokeless tobacco: Never Used  Substance Use Topics  . Alcohol use: No    Alcohol/week: 0.0 standard drinks     Current Outpatient Medications:  .  albuterol (VENTOLIN HFA) 108 (90 Base) MCG/ACT inhaler, Inhale 2 puffs into the lungs every 4 (four) hours as needed for wheezing or shortness of breath., Disp: 18 g, Rfl: 2 .  ALPRAZolam (XANAX) 0.5 MG tablet, Take 0.5 mg by mouth 4 (four) times daily. , Disp: , Rfl:  .  atorvastatin (LIPITOR)  40 MG tablet, Take 1 tablet (40 mg total) by mouth at bedtime., Disp: 90 tablet, Rfl: 3 .  benztropine (COGENTIN) 1 MG tablet, Take 1 mg by mouth at bedtime., Disp: , Rfl:  .  divalproex (DEPAKOTE) 250 MG DR tablet, Take 1 tablet (250 mg total) by mouth 2 (two) times daily., Disp: 60 tablet, Rfl: 2 .  escitalopram (LEXAPRO) 20 MG tablet, Take 1 tablet (20 mg total) by mouth daily., Disp: 30 tablet, Rfl: 2 .  ipratropium-albuterol (DUONEB) 0.5-2.5 (3) MG/3ML SOLN, Take 3 mLs by nebulization 3 (three) times daily as needed., Disp: 180 mL, Rfl: 1 .  LATUDA 60 MG TABS, Take 60 mg by mouth at bedtime. , Disp: , Rfl:  .  Multiple  Vitamin (MULTIVITAMIN WITH MINERALS) TABS tablet, Take 1 tablet by mouth daily., Disp: , Rfl:  .  promethazine (PHENERGAN) 25 MG tablet, Take 25 mg by mouth 2 (two) times daily as needed for nausea or vomiting., Disp: , Rfl:  .  Respiratory Therapy Supplies (NEBULIZER/TUBING/MOUTHPIECE) KIT, Disp one nebulizer machine, tubing set and mouthpiece kit, Disp: 1 kit, Rfl: 0 .  rOPINIRole (REQUIP) 4 MG tablet, Take 4 mg by mouth at bedtime. , Disp: , Rfl:  .  Tiotropium Bromide Monohydrate (SPIRIVA RESPIMAT) 2.5 MCG/ACT AERS, INHALE 2 PUFFS INTO LUNGS DAILY, Disp: 4 g, Rfl: 2 .  tiZANidine (ZANAFLEX) 2 MG tablet, TAKE 1 TO 2 TABLETS BY MOUTH 3 TIMES DAILY AS NEEDED FOR MUSLCE SPASMS, Disp: 60 tablet, Rfl: 1  Allergies  Allergen Reactions  . Chantix [Varenicline] Nausea Only  . Codeine Nausea And Vomiting    With staff assistance, above reviewed with the patient today.  ROS: As per HPI, otherwise no specific complaints on a limited and focused system review   Objective  Virtual encounter, vitals not obtained.  There is no height or weight on file to calculate BMI.  Physical Exam   Appears in NAD via conversation, slightly nasal sounding voice, and a couple prolonged coughing episodes during our conversation, sounding like a dry cough Breathing: No obvious respiratory  distress. Speaking in complete sentences Neurological: Pt is alert, Speech is normal Psychiatric: Patient has a normal mood and affect, Judgment and thought content normal.   No results found for this or any previous visit (from the past 72 hour(s)).  PHQ2/9: Depression screen [facility] 2/9 02/02/2020 12/07/2019 08/06/2019 07/02/2019 05/28/2019  Decreased Interest 3 3 3  0 0  Down, Depressed, Hopeless 1 3 2  0 1  PHQ - 2 Score 4 6 5  0 1  Altered sleeping 1 3 3  0 1  Tired, decreased energy 0 3 3 0 1  Change in appetite 1 3 3  0 1  Feeling bad or failure about yourself  0 3 0 0 0  Trouble concentrating 2 3 3  0 1  Moving slowly or fidgety/restless 0 3 0 0 0  Suicidal thoughts 0 2 0 0 0  PHQ-9 Score 8 26 17  0 5  Difficult doing work/chores Somewhat difficult Extremely dIfficult Somewhat difficult Not difficult at all Somewhat difficult  Some recent data might be hidden   PHQ-2/9 Result reviewed  Fall Risk: Fall Risk  02/02/2020 12/07/2019 08/06/2019 07/02/2019 05/28/2019  Falls in the past year? 0 0 0 0 0  Comment - - - - -  Number falls in past yr: 0 0 0 0 0  Comment - - - - -  Injury with Fall? 0 0 0 0 0  Comment - - - - -  Risk Factor Category  - - - - -  Risk for fall due to : - - - - -  Follow up - Falls evaluation completed - - -  Comment - - - - -     Assessment & Plan  1. COPD exacerbation ([place]) 2. Cough 3. COPD with asthma ([place]) Noted concerns with this being a phone visit and not in person, and did note concerns exist for a respiratory infection.  Discussed concerns for possible Covid in the setting of the pandemic, and do feel getting tested is indicated.  Reviewed places she can get tested presently, and strongly recommended she do so.  She states she would do so.  Needs to remain isolated until that test returns  at a minimum, and if negative, remain isolated until her symptoms significantly improved/resolved.  If that test is positive, she should contact us to discuss further next  steps.  She would likely be a candidate for the monoclonal antibody infusions offered if available due to comorbidities.  Did feel treating this like a COPD exacerbation was indicated, and will proceed as follows. We will add an oral steroid with a mild wean as ordered.  (Will prescribe prednisone 40 mg for 3 days, then 20 mg for 3 days) Also will add doxycycline-twice daily, and not use azithromycin due to some concerns with potential interactions of other medicines may be taking. Recommended using the Spiriva product that she was taking daily, not adding the old Anoro at this time, and using the albuterol inhaler routinely 3-4 times a day over the next few days to help with clearance and symptoms. Can continue the generic Mucus Relief product and Tylenol product as needed, stay well-hydrated, rest important  4. Current smoker She notes she has not been able to smoke due to her symptoms, and did note her smoking history puts her at a higher risk for severity of infection and strongly encouraged her to continue off of tobacco.  5. Bipolar disorder with depression (HCC)/anxiety Has this history, with her PHQ noted today.  Was seeing psychiatry to help manage.  Also on medications to help manage.  Emphasized to her at length the importance of following up to be seen if her symptoms are increasing, especially if more severe symptoms arise such as marked shortness of breath, increasing chest pains, fevers, more production to her cough, or passing out feelings, and she was understanding of that.  Not waiting too long to be seen if symptoms not improving emphasized as well.  There are marked limitations to this phone visit.  I discussed the assessment and treatment plan with the patient. The patient was provided an opportunity to ask questions and all were answered. The patient agreed with the plan and demonstrated an understanding of the instructions.  Red flags and when to present for emergency care or  RTC including fevers, chest pain, shortness of breath, new/worsening/un-resolving symptoms reviewed with patient at time of visit.   The patient was advised to call back or seek an in-person evaluation if the symptoms worsen or if the condition fails to improve as anticipated.  I provided 20 minutes of non-face-to-face time during this encounter that included discussing at length patient's sx/history, pertinent pmhx, medications, treatment and follow up plan. This time also included the necessary documentation, orders, and chart review.  Towanda Malkin, MD

## 2020-02-04 ENCOUNTER — Other Ambulatory Visit: Payer: Self-pay | Admitting: Family Medicine

## 2020-02-04 DIAGNOSIS — J449 Chronic obstructive pulmonary disease, unspecified: Secondary | ICD-10-CM

## 2020-02-04 MED ORDER — ALBUTEROL SULFATE HFA 108 (90 BASE) MCG/ACT IN AERS: 2.0000 | INHALATION_SPRAY | RESPIRATORY_TRACT | 2 refills | Status: DC | PRN

## 2020-02-04 NOTE — Telephone Encounter (Signed)
Copied from [place] [id number]. Topic: Quick Communication - Rx Refill/Question >> Feb 04, 2020  8:54 AM Leward Quan A wrote: Medication: albuterol (VENTOLIN HFA) 108 (90 Base) MCG/ACT inhaler Asking for RX to go to [facility] today going out of town tomorrow  Has the patient contacted their pharmacy? Yes.   (Agent: If no, request that the patient contact the pharmacy for the refill.) (Agent: If yes, when and what did the pharmacy advise?)  Preferred Pharmacy (with phone number or street name): [place], [place] [facility] [place]  Phone:  [phone] Fax:  [phone]     Agent: Please be advised that RX refills may take up to 3 business days. We ask that you follow-up with your pharmacy.

## 2020-02-12 ENCOUNTER — Institutional Professional Consult (permissible substitution): Payer: Medicare Other | Admitting: Internal Medicine

## 2020-02-23 ENCOUNTER — Other Ambulatory Visit: Payer: Self-pay | Admitting: Family Medicine

## 2020-02-23 DIAGNOSIS — M549 Dorsalgia, unspecified: Secondary | ICD-10-CM

## 2020-02-23 DIAGNOSIS — M797 Fibromyalgia: Secondary | ICD-10-CM

## 2020-02-23 DIAGNOSIS — J449 Chronic obstructive pulmonary disease, unspecified: Secondary | ICD-10-CM

## 2020-02-23 DIAGNOSIS — J441 Chronic obstructive pulmonary disease with (acute) exacerbation: Secondary | ICD-10-CM

## 2020-02-29 ENCOUNTER — Ambulatory Visit: Payer: Self-pay | Admitting: *Deleted

## 2020-02-29 NOTE — Telephone Encounter (Signed)
Patient experiencing abdominal, feet, calve and hand pain for several months, unsure if its arthritis. Patient states the abdominal pain happens when she eats or drinks. Patient scheduled with the next available female physician on 03/22/2020. Patient reports she has had abdominal pain for a couple of months and getting worse. Pain below "belly button" reported and abdomen feels heavy. Patient reports taking pepto bismol daily and phenergan for the pain. Denies diarrhea or fever or pain radiating to back . Patient also reports vaginal discharge noted for the last month of Dec. White sticky and bad odor, unable to describe. Patient reports abdominal pain in now constant and she is tired of no relief. No available appt until 03/22/20. Patient requires public transportation and needs 3/5 days to set up ride if earlier appt available. Encouraged patient to go to [facility] or ED if pain is constant and she reports she will not go to [facility] Encouraged [place] to patient. Care advise given. Patient verbalized understanding of care advise and to call back or go to ED for worsening symptoms.   Reason for Disposition . [1] MILD-MODERATE pain AND [2] constant AND [3] present > 2 hours  Answer Assessment - Initial Assessment Questions 1. LOCATION: "Where does it hurt?"      Lower abdomen in the middle  2. RADIATION: "Does the pain shoot anywhere else?" (e.g., chest, back)     No but has had shooting pain down  3. ONSET: "When did the pain begin?" (e.g., minutes, hours or days ago)      Couple months ago  4. SUDDEN: "Gradual or sudden onset?"     Was gradual now becoming constant  5. PATTERN "Does the pain come and go, or is it constant?"    - If constant: "Is it getting better, staying the same, or worsening?"      (Note: Constant means the pain never goes away completely; most serious pain is constant and it progresses)     - If intermittent: "How long does it last?" "Do you have pain now?"     (Note:  Intermittent means the pain goes away completely between bouts)     Constant  6. SEVERITY: "How bad is the pain?"  (e.g., Scale 1-10; mild, moderate, or severe)   - MILD (1-3): doesn't interfere with normal activities, abdomen soft and not tender to touch    - MODERATE (4-7): interferes with normal activities or awakens from sleep, tender to touch    - SEVERE (8-10): excruciating pain, doubled over, unable to do any normal activities      Mild  7. RECURRENT SYMPTOM: "Have you ever had this type of stomach pain before?" If Yes, ask: "When was the last time?" and "What happened that time?"      no 8. CAUSE: "What do you think is causing the stomach pain?"     unknown 9. RELIEVING/AGGRAVATING FACTORS: "What makes it better or worse?" (e.g., movement, antacids, bowel movement)     Soft not formed BMs. Taking Pepto bismol. Taking phenergan . 10. OTHER SYMPTOMS: "Has there been any vomiting, diarrhea, constipation, or urine problems?"       No  11. PREGNANCY: "Is there any chance you are pregnant?" "When was your last menstrual period?"       na  Protocols used: ABDOMINAL PAIN - [facility]

## 2020-03-01 NOTE — Telephone Encounter (Signed)
Patient stated she did not go to ED or UC for pain and she refused today also . She stated she is feeling a little better but still in pain.

## 2020-03-07 NOTE — Telephone Encounter (Signed)
Agree pt needs assessment for sx

## 2020-03-07 NOTE — Telephone Encounter (Signed)
Please call and make this patient appointment if she still need

## 2020-03-08 NOTE — Telephone Encounter (Signed)
lvm to see if pt is still needing this appt. If so please schedule

## 2020-03-17 ENCOUNTER — Ambulatory Visit (INDEPENDENT_AMBULATORY_CARE_PROVIDER_SITE_OTHER): Payer: Medicare Other

## 2020-03-17 DIAGNOSIS — J449 Chronic obstructive pulmonary disease, unspecified: Secondary | ICD-10-CM

## 2020-03-17 DIAGNOSIS — M797 Fibromyalgia: Secondary | ICD-10-CM

## 2020-03-17 DIAGNOSIS — Z1211 Encounter for screening for malignant neoplasm of colon: Secondary | ICD-10-CM

## 2020-03-17 DIAGNOSIS — Z720 Tobacco use: Secondary | ICD-10-CM

## 2020-03-17 DIAGNOSIS — F411 Generalized anxiety disorder: Secondary | ICD-10-CM | POA: Diagnosis not present

## 2020-03-17 DIAGNOSIS — Z0000 Encounter for general adult medical examination without abnormal findings: Secondary | ICD-10-CM | POA: Diagnosis not present

## 2020-03-17 NOTE — Patient Instructions (Signed)
[name] , Thank you for taking time to come for your Medicare Wellness Visit. I appreciate your ongoing commitment to your health goals. Please review the following plan we discussed and let me know if I can assist you in the future.   Screening recommendations/referrals: Colonoscopy: done 08/07/17. Due for repeat screening colonoscopy. Referral sent to [facility] for follow up. They will contact you for an appointment.  Mammogram: Please call [phone] to schedule your mammogram.  Bone Density: due at age 43 Recommended yearly ophthalmology/optometry visit for glaucoma screening and checkup Recommended yearly dental visit for hygiene and checkup  Vaccinations: Influenza vaccine: due Pneumococcal vaccine: done 06/21/15 Tdap vaccine: done 12/13/15 Shingles vaccine: done 07/25/18 & 10/08/18  Covid-19: done 06/03/19, 07/15/19 & 03/08/20  Advanced directives: Advance directive discussed with you today. I have provided a copy for you to complete at home and have notarized. Once this is complete please bring a copy in to our office so we can scan it into your chart.  Conditions/risks identified: If you wish to quit smoking, help is available. For free tobacco cessation program offerings call the [facility] at [phone] or Live Well Line at 747 823 1173. You may also visit [email or web address] or email [email or web address] for more information on other programs.   Next appointment: Follow up in one year for your annual wellness visit.   Preventive Care 40-64 Years, Female Preventive care refers to lifestyle choices and visits with your health care provider that can promote health and wellness. What does preventive care include?  A yearly physical exam. This is also called an annual well check.  Dental exams once or twice a year.  Routine eye exams. Ask your health care provider how often you should have your eyes checked.  Personal lifestyle choices,  including:  Daily care of your teeth and gums.  Regular physical activity.  Eating a healthy diet.  Avoiding tobacco and drug use.  Limiting alcohol use.  Practicing safe sex.  Taking low-dose aspirin daily starting at age 44.  Taking vitamin and mineral supplements as recommended by your health care provider. What happens during an annual well check? The services and screenings done by your health care provider during your annual well check will depend on your age, overall health, lifestyle risk factors, and family history of disease. Counseling  Your health care provider may ask you questions about your:  Alcohol use.  Tobacco use.  Drug use.  Emotional well-being.  Home and relationship well-being.  Sexual activity.  Eating habits.  Work and work Statistician.  Method of birth control.  Menstrual cycle.  Pregnancy history. Screening  You may have the following tests or measurements:  Height, weight, and BMI.  Blood pressure.  Lipid and cholesterol levels. These may be checked every 5 years, or more frequently if you are over 76 years old.  Skin check.  Lung cancer screening. You may have this screening every year starting at age 23 if you have a 30-pack-year history of smoking and currently smoke or have quit within the past 15 years.  Fecal occult blood test (FOBT) of the stool. You may have this test every year starting at age 52.  Flexible sigmoidoscopy or colonoscopy. You may have a sigmoidoscopy every 5 years or a colonoscopy every 10 years starting at age 34.  Hepatitis C blood test.  Hepatitis B blood test.  Sexually transmitted disease (STD) testing.  Diabetes screening. This is done by checking your blood sugar (glucose) after you  have not eaten for a while (fasting). You may have this done every 1-3 years.  Mammogram. This may be done every 1-2 years. Talk to your health care provider about when you should start having regular  mammograms. This may depend on whether you have a family history of breast cancer.  BRCA-related cancer screening. This may be done if you have a family history of breast, ovarian, tubal, or peritoneal cancers.  Pelvic exam and Pap test. This may be done every 3 years starting at age 59. Starting at age 35, this may be done every 5 years if you have a Pap test in combination with an HPV test.  Bone density scan. This is done to screen for osteoporosis. You may have this scan if you are at high risk for osteoporosis. Discuss your test results, treatment options, and if necessary, the need for more tests with your health care provider. Vaccines  Your health care provider may recommend certain vaccines, such as:  Influenza vaccine. This is recommended every year.  Tetanus, diphtheria, and acellular pertussis (Tdap, Td) vaccine. You may need a Td booster every 10 years.  Zoster vaccine. You may need this after age 30.  Pneumococcal 13-valent conjugate (PCV13) vaccine. You may need this if you have certain conditions and were not previously vaccinated.  Pneumococcal polysaccharide (PPSV23) vaccine. You may need one or two doses if you smoke cigarettes or if you have certain conditions. Talk to your health care provider about which screenings and vaccines you need and how often you need them. This information is not intended to replace advice given to you by your health care provider. Make sure you discuss any questions you have with your health care provider. Document Released: 03/11/2015 Document Revised: 11/02/2015 Document Reviewed: 12/14/2014 Elsevier Interactive Patient Education  2017 [place] Prevention in the Home Falls can cause injuries. They can happen to people of all ages. There are many things you can do to make your home safe and to help prevent falls. What can I do on the outside of my home?  Regularly fix the edges of walkways and driveways and fix any  cracks.  Remove anything that might make you trip as you walk through a door, such as a raised step or threshold.  Trim any bushes or trees on the path to your home.  Use bright outdoor lighting.  Clear any walking paths of anything that might make someone trip, such as rocks or tools.  Regularly check to see if handrails are loose or broken. Make sure that both sides of any steps have handrails.  Any raised decks and porches should have guardrails on the edges.  Have any leaves, snow, or ice cleared regularly.  Use sand or salt on walking paths during winter.  Clean up any spills in your garage right away. This includes oil or grease spills. What can I do in the bathroom?  Use night lights.  Install grab bars by the toilet and in the tub and shower. Do not use towel bars as grab bars.  Use non-skid mats or decals in the tub or shower.  If you need to sit down in the shower, use a plastic, non-slip stool.  Keep the floor dry. Clean up any water that spills on the floor as soon as it happens.  Remove soap buildup in the tub or shower regularly.  Attach bath mats securely with double-sided non-slip rug tape.  Do not have throw rugs and other  things on the floor that can make you trip. What can I do in the bedroom?  Use night lights.  Make sure that you have a light by your bed that is easy to reach.  Do not use any sheets or blankets that are too big for your bed. They should not hang down onto the floor.  Have a firm chair that has side arms. You can use this for support while you get dressed.  Do not have throw rugs and other things on the floor that can make you trip. What can I do in the kitchen?  Clean up any spills right away.  Avoid walking on wet floors.  Keep items that you use a lot in easy-to-reach places.  If you need to reach something above you, use a strong step stool that has a grab bar.  Keep electrical cords out of the way.  Do not use floor  polish or wax that makes floors slippery. If you must use wax, use non-skid floor wax.  Do not have throw rugs and other things on the floor that can make you trip. What can I do with my stairs?  Do not leave any items on the stairs.  Make sure that there are handrails on both sides of the stairs and use them. Fix handrails that are broken or loose. Make sure that handrails are as long as the stairways.  Check any carpeting to make sure that it is firmly attached to the stairs. Fix any carpet that is loose or worn.  Avoid having throw rugs at the top or bottom of the stairs. If you do have throw rugs, attach them to the floor with carpet tape.  Make sure that you have a light switch at the top of the stairs and the bottom of the stairs. If you do not have them, ask someone to add them for you. What else can I do to help prevent falls?  Wear shoes that:  Do not have high heels.  Have rubber bottoms.  Are comfortable and fit you well.  Are closed at the toe. Do not wear sandals.  If you use a stepladder:  Make sure that it is fully opened. Do not climb a closed stepladder.  Make sure that both sides of the stepladder are locked into place.  Ask someone to hold it for you, if possible.  Clearly mark and make sure that you can see:  Any grab bars or handrails.  First and last steps.  Where the edge of each step is.  Use tools that help you move around (mobility aids) if they are needed. These include:  Canes.  Walkers.  Scooters.  Crutches.  Turn on the lights when you go into a dark area. Replace any light bulbs as soon as they burn out.  Set up your furniture so you have a clear path. Avoid moving your furniture around.  If any of your floors are uneven, fix them.  If there are any pets around you, be aware of where they are.  Review your medicines with your doctor. Some medicines can make you feel dizzy. This can increase your chance of falling. Ask your  doctor what other things that you can do to help prevent falls. This information is not intended to replace advice given to you by your health care provider. Make sure you discuss any questions you have with your health care provider. Document Released: 12/09/2008 Document Revised: 07/21/2015 Document Reviewed: 03/19/2014 Elsevier Interactive Patient  Education  2017 [employer]  

## 2020-03-17 NOTE — Progress Notes (Signed)
Subjective:   Nicole Austin is a 60 y.o. female who presents for Medicare Annual (Subsequent) preventive examination.  Virtual Visit via Telephone Note  I connected with  Nicole Austin on 03/17/20 at  2:10 PM EST by telephone and verified that I am speaking with the correct person using two identifiers.  Location: Patient: home Provider: [place] Persons participating in the virtual visit: Nicole Austin   I discussed the limitations, risks, security and privacy concerns of performing an evaluation and management service by telephone and the availability of in person appointments. The patient expressed understanding and agreed to proceed.  Interactive audio and video telecommunications were attempted between this nurse and patient, however failed, due to patient having technical difficulties OR patient did not have access to video capability.  We continued and completed visit with audio only.  Some vital signs may be absent or patient reported.   Nicole Marker, LPN    Review of Systems     Cardiac Risk Factors include: advanced age (>11mn, >>42women);dyslipidemia;hypertension;smoking/ tobacco exposure;obesity (BMI >30kg/m2)     Objective:    There were no vitals filed for this visit. There is no height or weight on file to calculate BMI.  Advanced Directives 03/17/2020 05/14/2019 05/14/2019 03/17/2019 01/29/2019 01/01/2019 12/03/2018  Does Patient Have a Medical Advance Directive? No No No No No No No  Type of Advance Directive - - - - - - -  Does patient want to make changes to medical advance directive? - - - - - - -  [place]? - - - - - - -  Would patient like information on creating a medical advance directive? Yes (MAU/Ambulatory/Procedural Areas - Information given) No - Patient declined - Yes (MAU/Ambulatory/Procedural Areas - Information given) No - Patient declined No - Patient declined No - Patient declined  Some  encounter information is confidential and restricted. Go to Review Flowsheets activity to see all data.    Current Medications (verified) Outpatient Encounter Medications as of 03/17/2020  Medication Sig  . albuterol (VENTOLIN HFA) 108 (90 Base) MCG/ACT inhaler Inhale 2 puffs into the lungs every 4 (four) hours as needed for wheezing or shortness of breath.  . ALPRAZolam (XANAX) 0.5 MG tablet Take 0.5 mg by mouth 4 (four) times daily.  .Marland Kitchenatorvastatin (LIPITOR) 40 MG tablet Take 1 tablet (40 mg total) by mouth at bedtime.  . benztropine (COGENTIN) 1 MG tablet Take 1 mg by mouth at bedtime.  . divalproex (DEPAKOTE) 250 MG DR tablet Take 1 tablet (250 mg total) by mouth 2 (two) times daily.  .Marland Kitchenescitalopram (LEXAPRO) 20 MG tablet Take 1 tablet (20 mg total) by mouth daily.  .Marland KitchenLATUDA 60 MG TABS Take 60 mg by mouth at bedtime.   . Multiple Vitamin (MULTIVITAMIN WITH MINERALS) TABS tablet Take 1 tablet by mouth daily.  . promethazine (PHENERGAN) 25 MG tablet Take 25 mg by mouth 2 (two) times daily as needed for nausea or vomiting.  .Marland KitchenrOPINIRole (REQUIP XL) 4 MG 24 hr tablet Take 4 mg by mouth daily.  . Tiotropium Bromide Monohydrate (SPIRIVA RESPIMAT) 2.5 MCG/ACT AERS INHHALE TWO PUFFS INTO LUNGS DAILY  . tiZANidine (ZANAFLEX) 2 MG tablet TAKE 1 TO 2 TABLETS BY MOUTH 3 TIMES DAILY AS NEEDED FOR MUSLCE SPASMS  . ipratropium-albuterol (DUONEB) 0.5-2.5 (3) MG/3ML SOLN Take 3 mLs by nebulization 3 (three) times daily as needed. (Patient not taking: Reported on 03/17/2020)  . Respiratory Therapy Supplies (  NEBULIZER/TUBING/MOUTHPIECE) KIT Disp one nebulizer machine, tubing set and mouthpiece kit (Patient not taking: Reported on 03/17/2020)  . [DISCONTINUED] doxycycline (VIBRA-TABS) 100 MG tablet Take 1 tablet (100 mg total) by mouth 2 (two) times daily.  . [DISCONTINUED] predniSONE (DELTASONE) 20 MG tablet Take two tabs daily x 3 days, then one tab daily X 3 days,  . [DISCONTINUED] rOPINIRole (REQUIP) 4 MG  tablet Take 4 mg by mouth at bedtime.    No facility-administered encounter medications on file as of 03/17/2020.    Allergies (verified) Chantix [varenicline] and Codeine   History: Past Medical History:  Diagnosis Date  . ADHD (attention deficit hyperactivity disorder)   . Apnea, sleep 06/30/2015  . Arthritis   . Asthma   . Asthma with acute exacerbation 06/30/2015  . Bipolar 1 disorder ([place])   . Chronic pain   . COPD (chronic obstructive pulmonary disease) ([place]) 11/2018   **LUNG SOUNDS WHEEZING AND WITH RALES**  . COPD, moderate ([place]) 11/18/2013   Overview:  Last Assessment & Plan:  Refer to pulmonologist Overview:  Overview:  Last Assessment & Plan:  Pneumonia vaccine offered and given today; she did not tolerate LABA; will start her on Spiriva; stop combivent neb; use only SABA neb OR inhaler, not both; smoking cessation urged, and I am here to help if she wants assistance quitting; f/u in 4 weeks Last Assessment & Plan:  Pneumonia vaccin  . Depression   . Dyspnea    with exertion  . GERD (gastroesophageal reflux disease) 08/30/2015  . Gout 08/16/2014  . History of acute myocardial infarction 06/30/2015   Overview:  [facility]   . HPV (human papilloma virus) infection 07/17/2013  . Hyperlipidemia   . Low HDL (under 40) 08/30/2015  . MI (myocardial infarction) ([place]) 2018  . Parkinson disease ([place])   . Pre-diabetes   . Prediabetes 05/20/2016  . PTSD (post-traumatic stress disorder)    history of panic attacks  . PTSD (post-traumatic stress disorder)   . PTSD (post-traumatic stress disorder)   . Restless leg syndrome   . Rhabdomyolysis   . Sleep apnea 06/30/2015  . Stage 3 severe COPD by GOLD classification ([place]) 11/18/2013  . Stroke ([place]) 2018  . Tremors of nervous system    hands  . Uncomplicated asthma 05/28/3242  . Varicella 02/25/2017   Past Surgical History:  Procedure Laterality Date  . BACK SURGERY    . CATARACT EXTRACTION W/PHACO Right 01/01/2019   Procedure: CATARACT EXTRACTION  PHACO AND INTRAOCULAR LENS PLACEMENT (IOC) right vision blue;  Surgeon: Marchia Meiers, MD;  Location: [facility] ORS;  Service: Ophthalmology;  Laterality: Right;  [place] 00:39.4 CDE 5.49 Fluid Pack lot # [id number]  . CATARACT EXTRACTION W/PHACO Left 01/29/2019   Procedure: CATARACT EXTRACTION PHACO AND INTRAOCULAR LENS PLACEMENT ([place]) LEFT Vision Blue;  Surgeon: Marchia Meiers, MD;  Location: [facility] ORS;  Service: Ophthalmology;  Laterality: Left;  [place] 00:51.1 CDE 4.38 Fluid Pack Lot # [id number]  . COLONOSCOPY WITH PROPOFOL N/A 08/07/2017   Procedure: COLONOSCOPY WITH PROPOFOL;  Surgeon: Virgel Manifold, MD;  Location: [facility] ENDOSCOPY;  Service: Endoscopy;  Laterality: N/A;  . EYE SURGERY    . JOINT REPLACEMENT Left 2016   tkr  . REPLACEMENT TOTAL KNEE Left 2016   Family History  Problem Relation Age of Onset  . Hernia Mother   . Heart disease Mother   . OCD Mother   . Diabetes Mother   . Parkinson's disease Father   . Bipolar disorder Sister   . Schizophrenia  Sister   . ADD / ADHD Sister   . Alcohol abuse Brother   . Bipolar disorder Sister   . Paranoid behavior Sister   . ADD / ADHD Sister   . ADD / ADHD Son   . Dementia Maternal Grandmother   . Emphysema Maternal Grandfather   . ADD / ADHD Son   . ADD / ADHD Son   . Depression Son    Social History   Socioeconomic History  . Marital status: Single    Spouse name: Haynes Dage - sister  . Number of children: 3  . Years of education: Not on file  . Highest education level: 10th grade  Occupational History  . Occupation: Disability  Tobacco Use  . Smoking status: Current Every Day Smoker    Packs/day: 1.00    Years: 36.00    Pack years: 36.00    Types: Cigarettes  . Smokeless tobacco: Never Used  Vaping Use  . Vaping Use: Former  Substance and Sexual Activity  . Alcohol use: No    Alcohol/week: 0.0 standard drinks  . Drug use: No  . Sexual activity: Not Currently  Other Topics Concern  . Not on file  Social History  Narrative   Pt lives alone   Social Determinants of Health   Financial Resource Strain: Medium Risk  . Difficulty of Paying Living Expenses: Somewhat hard  Food Insecurity: No Food Insecurity  . Worried About Charity fundraiser in the Last Year: Never true  . Ran Out of Food in the Last Year: Never true  Transportation Needs: Unmet Transportation Needs  . Lack of Transportation (Medical): Yes  . Lack of Transportation (Non-Medical): Yes  Physical Activity: Inactive  . Days of Exercise per Week: 0 days  . Minutes of Exercise per Session: 0 min  Stress: Stress Concern Present  . Feeling of Stress : Rather much  Social Connections: Socially Isolated  . Frequency of Communication with Friends and Family: More than three times a week  . Frequency of Social Gatherings with Friends and Family: More than three times a week  . Attends Religious Services: Never  . Active Member of Clubs or Organizations: No  . Attends Archivist Meetings: Never  . Marital Status: Divorced    Tobacco Counseling Ready to quit: Not Answered Counseling given: Not Answered   Clinical Intake:  Pre-visit preparation completed: Yes  Pain : No/denies pain     Nutritional Risks: None Diabetes: No  How often do you need to have someone help you when you read instructions, pamphlets, or other written materials from your doctor or pharmacy?: 1 - Never    Interpreter Needed?: No  Information entered by :: Nicole Marker LPN   Activities of Daily Living In your present state of health, do you have any difficulty performing the following activities: 03/17/2020 02/02/2020  Hearing? N N  Comment declines hearing aids -  Vision? N N  Difficulty concentrating or making decisions? N N  Walking or climbing stairs? N N  Dressing or bathing? N N  Doing errands, shopping? N N  Preparing Food and eating ? N -  Using the Toilet? N -  In the past six months, have you accidently leaked urine? Y -   Comment wears liners for protection -  Do you have problems with loss of bowel control? N -  Managing your Medications? N -  Managing your Finances? N -  Housekeeping or managing your Housekeeping? N -  Some recent  data might be hidden    Patient Care Team: Delsa Grana, PA-C as PCP - General (Family Medicine) Virgel Manifold, MD as Consulting Physician (Gastroenterology) Vladimir Crofts, MD as Consulting Physician (Neurology) Myer Haff, MD as Referring Physician (Psychiatry) Hambright, Stephani Police, LCSW as Social Worker Sales promotion account executive)  Indicate any recent Medical Services you may have received from other than [facility] providers in the past year (date may be approximate).     Assessment:   This is a routine wellness examination for [place].  Hearing/Vision screen  Hearing Screening   125Hz  250Hz  500Hz  1000Hz  2000Hz  3000Hz  4000Hz  6000Hz  8000Hz   Right ear:           Left ear:           Comments: Pt denies hearing difficulty  Vision Screening Comments: Pt denies hearing difficulty  Dietary issues and exercise activities discussed: Current Exercise Habits: The patient does not participate in regular exercise at present, Exercise limited by: respiratory conditions(s)  Goals    . DIET - INCREASE WATER INTAKE     Recommend increasing water to 6-8 glasses per day    . Eat more fruits and vegetables    . Increase physical activity      Depression Screen PHQ 2/9 Scores 03/17/2020 02/02/2020 12/07/2019 08/06/2019 07/02/2019 05/28/2019 05/14/2019  PHQ - 2 Score 6 4 6 5  0 1 2  PHQ- 9 Score 15 8 26 17  0 5 8  Exception Documentation - - - - - - -    Fall Risk Fall Risk  03/17/2020 02/02/2020 12/07/2019 08/06/2019 07/02/2019  Falls in the past year? 0 0 0 0 0  Comment - - - - -  Number falls in past yr: 0 0 0 0 0  Comment - - - - -  Injury with Fall? 0 0 0 0 0  Comment - - - - -  Risk Factor Category  - - - - -  Risk for fall due to : No Fall Risks - - - -  Follow up Falls  prevention discussed - Falls evaluation completed - -  Comment - - - - -    FALL RISK PREVENTION PERTAINING TO THE HOME:  Any stairs in or around the home? No  If so, are there any without handrails? No  Home free of loose throw rugs in walkways, pet beds, electrical cords, etc? Yes  Adequate lighting in your home to reduce risk of falls? Yes   ASSISTIVE DEVICES UTILIZED TO PREVENT FALLS:  Life alert? No  Use of a cane, walker or w/c? No  Grab bars in the bathroom? Yes Shower chair or bench in shower? Yes  Elevated toilet seat or a handicapped toilet? No   TIMED UP AND GO:  Was the test performed? No . Telephonic visit.   Cognitive Function:     6CIT Screen 03/17/2020 03/17/2019 02/27/2018  What Year? 0 points 0 points 0 points  What month? 0 points 0 points 0 points  What time? 0 points 0 points 0 points  Count back from 20 0 points 0 points 0 points  Months in reverse 0 points 4 points 0 points  Repeat phrase 2 points 0 points 4 points  Total Score 2 4 4     Immunizations Immunization History  Administered Date(s) Administered  . Influenza Split 12/15/2012  . Influenza,inj,Quad PF,6+ Mos 10/27/2013, 12/22/2014, 12/13/2015, 10/16/2016, 05/02/2018  . Influenza-Unspecified 11/07/2011, 12/15/2012  . Moderna Sars-Covid-2 Vaccination 06/03/2019, 07/15/2019, 03/08/2020  . Pneumococcal Polysaccharide-23 06/21/2015  .  Tdap 12/13/2015  . Zoster Recombinat (Shingrix) 07/25/2018, 10/08/2018    TDAP status: Up to date  Flu Vaccine status: Due, Education has been provided regarding the importance of this vaccine. Advised may receive this vaccine at local pharmacy or Health Dept. Aware to provide a copy of the vaccination record if obtained from local pharmacy or Health Dept. Verbalized acceptance and understanding.  Pneumococcal vaccine status: Up to date  Covid-19 vaccine status: Completed vaccines  Qualifies for Shingles Vaccine? Yes   Zostavax completed Yes   Shingrix  Completed?: Yes  Screening Tests Health Maintenance  Topic Date Due  . MAMMOGRAM  Never done  . COLONOSCOPY (Pts 45-9yr Insurance coverage will need to be confirmed)  08/08/2018  . INFLUENZA VACCINE  09/27/2019  . COVID-19 Vaccine (4 - Booster for Moderna series) 09/05/2020  . PAP SMEAR-Modifier  05/01/2021  . TETANUS/TDAP  12/12/2025  . Hepatitis C Screening  Completed  . HIV Screening  Completed    Health Maintenance  Health Maintenance Due  Topic Date Due  . MAMMOGRAM  Never done  . COLONOSCOPY (Pts 45-454yrInsurance coverage will need to be confirmed)  08/08/2018  . INFLUENZA VACCINE  09/27/2019    Colorectal cancer screening: Type of screening: Colonoscopy. Completed 08/07/17. Repeat every 1 years. New referral sent to gastroenterology for patient to follow up.   Mammogram status: Ordered 08/06/19. Pt provided with contact info and advised to call to schedule appt.   Bone density status: due at age [age]Lung Cancer Screening: (Low Dose CT Chest recommended if Age 61-80ears, 30 pack-year currently smoking OR have quit w/in 15years.) does qualify. Previously ordered on 03/19/19. Pt unable to keep appt.    Additional Screening:  Hepatitis C Screening: does qualify; Completed 10/16/16  Vision Screening: Recommended annual ophthalmology exams for early detection of glaucoma and other disorders of the eye. Is the patient up to date with their annual eye exam?  Yes  Who is the provider or what is the name of the office in which the patient attends annual eye exams? [place]: Recommended annual dental exams for proper oral hygiene  Community Resource Referral / Chronic Care Management: CRR required this visit?  No   CCM required this visit?  No      Plan:     I have personally reviewed and noted the following in the patient's chart:   . Medical and social history . Use of alcohol, tobacco or illicit drugs  . Current medications and  supplements . Functional ability and status . Nutritional status . Physical activity . Advanced directives . List of other physicians . Hospitalizations, surgeries, and ER visits in previous 12 months . Vitals . Screenings to include cognitive, depression, and falls . Referrals and appointments  In addition, I have reviewed and discussed with patient certain preventive protocols, quality metrics, and best practice recommendations. A written personalized care plan for preventive services as well as general preventive health recommendations were provided to patient.     KaClemetine MarkerLPN   03/06/65/8938 Nurse Notes: pt states she has not been able to get a nebulizer machine. I contact [facility] are unable to bill for DME supplies through Medicare part B. Referral sent to CCM for follow up. [facility] also need [facility] regarding preferred billable DME supplier.

## 2020-03-18 ENCOUNTER — Telehealth: Payer: Self-pay | Admitting: *Deleted

## 2020-03-18 ENCOUNTER — Telehealth: Payer: Self-pay

## 2020-03-18 NOTE — Telephone Encounter (Signed)
Contacted patient to let her know I called [employer] today to find out who preferred DME supplier is in order to obtain nebulizer.   Preferrered provider is [employer]. [place] office and spoke to [place] at [phone] who was able to pull the order for the nebulizer and supplies from [employer]. Per Lavella Lemons they will contact patient to notify and confirm demographics. Per Lavella Lemons pt should not have any coinsurance with [facility] and Medicaid as secondary.   Patient aware Huey Romans will contact her and then she will need to call [place] for duoneb rx. Pt very appreciative of call and thankful to be able to get nebulizer.

## 2020-03-18 NOTE — Chronic Care Management (AMB) (Signed)
  Chronic Care Management   Outreach Note  03/18/2020 Name: [name] MRN: [id number] DOB: [birth year]  Nicole Austin is a 60 y.o. year old female who is a primary care patient of Delsa Grana, [place]. I reached out to [place] by phone today in response to a referral sent by Ms. Gennie Alma Miske's patient's AWV (annual wellness visit) nurse, [name] LPN.      An unsuccessful telephone outreach was attempted today. The patient was referred to the case management team for assistance with care management and care coordination.   Follow Up Plan: A HIPAA compliant phone message was left for the patient providing contact information and requesting a return call. The care management team will reach out to the patient again over the next 7 days. If patient returns call to provider office, please advise to call [place] at [phone].  [place] Management

## 2020-03-21 NOTE — Telephone Encounter (Signed)
Pt called to let me know that she has been contacted by [facility] and they will be delivering her nebulizer machine today at no cost. Pt very appreciative to receive new machine. Pt aware to contact office for any other needs.

## 2020-03-21 NOTE — Chronic Care Management (AMB) (Signed)
  Chronic Care Management   Note  03/21/2020 Name: Nicole Austin MRN: [id number] DOB: 02-21-1961  Nicole Austin is a 60 y.o. year old female who is a primary care patient of Delsa Grana, [place]. I reached out to [place] by phone today in response to a referral sent by [name]'s patient's AWV (annual wellness visit) nurse, Trish Mage, LPN..      Nicole Austin was given information about Chronic Care Management services today including:  1. CCM service includes personalized support from designated clinical staff supervised by her physician, including individualized plan of care and coordination with other care providers 2. 24/7 contact phone numbers for assistance for urgent and routine care needs. 3. Service will only be billed when office clinical staff spend 20 minutes or more in a month to coordinate care. 4. Only one practitioner may furnish and bill the service in a calendar month. 5. The patient may stop CCM services at any time (effective at the end of the month) by phone call to the office staff. 6. The patient will be responsible for cost sharing (co-pay) of up to 20% of the service fee (after annual deductible is met).  Patient agreed to services and verbal consent obtained.   Follow up plan: Telephone appointment with care management team member scheduled for: RNCM on 03/23/2020 and Pharmacy on 04/20/2020  [place] Management

## 2020-03-22 ENCOUNTER — Ambulatory Visit: Payer: Medicaid Other | Admitting: Family Medicine

## 2020-03-23 ENCOUNTER — Telehealth: Payer: Self-pay

## 2020-03-23 ENCOUNTER — Telehealth: Payer: Medicaid Other

## 2020-03-23 NOTE — Telephone Encounter (Signed)
Encounter opened in error. Please disregard.

## 2020-03-23 NOTE — Telephone Encounter (Signed)
  Chronic Care Management   Outreach Note  03/23/2020 Name: Eleshia Wooley MRN: [id number] DOB: 1960/04/04  Primary Care Provider: Delsa Grana, PA-C Reason for referral : Chronic Care Management   An unsuccessful telephone outreach was attempted today. Ms. Friedt was referred to the case management team for assistance with care management and care coordination.    Follow Up Plan:  A HIPAA compliant voice message was left today requesting a return call.    Cristy [facility] Care Management [facility] [phone]

## 2020-03-30 ENCOUNTER — Telehealth: Payer: Self-pay

## 2020-03-30 ENCOUNTER — Telehealth: Payer: Medicare Other

## 2020-03-30 NOTE — Telephone Encounter (Signed)
Copied from [place] [phone]. Topic: General - Other >> Mar 30, 2020  8:11 AM Alanda Slim E wrote: Reason for CRM: pt asked to speak with [name]'s nurse / pt stated it is about something personal with her private parts/ please advise

## 2020-03-30 NOTE — Telephone Encounter (Signed)
Patient has missed her 1:30pm triage call.  LVM for her to call the office back to reschedule.  Thanks, [place], [place]

## 2020-03-30 NOTE — Telephone Encounter (Signed)
Patient is calling again to see if she could get the antibiotic sent to pharmacy today.  She stated she is very uncomfortable and would like the meds asap.  Please advise and call patient to confirm.

## 2020-03-30 NOTE — Telephone Encounter (Signed)
FYI  Patient told me she had yeast and want diflucan.

## 2020-03-30 NOTE — Telephone Encounter (Signed)
Patient stated she has a yeast infection and would like for you to call her in a diflucan.   [place]

## 2020-03-30 NOTE — Telephone Encounter (Signed)
Patient has made contact again requesting a call back. Patient shared that she is experiencing a burning sensation

## 2020-03-31 NOTE — Telephone Encounter (Signed)
Pt has an appt for 04/11/20 and we will call her if we get a cancellation.

## 2020-04-01 ENCOUNTER — Other Ambulatory Visit: Payer: Self-pay

## 2020-04-01 ENCOUNTER — Encounter: Payer: Self-pay | Admitting: Family Medicine

## 2020-04-01 ENCOUNTER — Telehealth (INDEPENDENT_AMBULATORY_CARE_PROVIDER_SITE_OTHER): Payer: Medicare Other | Admitting: Family Medicine

## 2020-04-01 DIAGNOSIS — L304 Erythema intertrigo: Secondary | ICD-10-CM

## 2020-04-01 DIAGNOSIS — N760 Acute vaginitis: Secondary | ICD-10-CM

## 2020-04-01 MED ORDER — FLUCONAZOLE 100 MG PO TABS: ORAL_TABLET | ORAL | 0 refills | Status: DC

## 2020-04-01 MED ORDER — NYSTATIN 100000 UNIT/GM EX POWD: 1.0000 "application " | Freq: Three times a day (TID) | CUTANEOUS | 1 refills | Status: DC

## 2020-04-01 NOTE — Progress Notes (Signed)
Name: Nicole Austin   MRN: [id number]    DOB: 04/06/1960   Date:04/01/2020       Progress Note  Subjective:    Chief Complaint  Chief Complaint  Patient presents with  . Vaginitis    I connected with  Nicole Austin on 04/01/20 at 10:20 AM EST by telephone and verified that I am speaking with the correct person using two identifiers.   I discussed the limitations, risks, security and privacy concerns of performing an evaluation and management service by telephone and the availability of in person appointments. Staff also discussed with the patient that there may be a patient responsible charge related to this service.  Patient verbalized understanding and agreed to proceed with encounter. Patient Location:   Provider Location: [facility] clinic Additional Individuals present: none  HPI   Symptoms started a week ago  Pt presents via telephone Odor and vaginal disch Bad discharge with foul smell - raw in between her legs - creases in legs and where stomach overlaps and skin folds Beefy red color, at one point bleeding, skin folds painful, cannot wear undergarments Trying corn starch to skin folds Now some dysuria the last day - no hematuria, bladder pressure, frequency urgency No abd pain, flank pain, N, V, F/C/S Appetite normal  She states the vag discharge has gotten a little less and odor better - similar to yeast infections in the past Urinary sx mild - does not feel like prior UTI  Skin folds are less raw but still red and uncomfortable and still having difficulty keeping skin dry       Patient Active Problem List   Diagnosis Date Noted  . Benign neoplasm of ascending colon   . Polyp of sigmoid colon   . Bipolar disorder with depression ([place]) 02/25/2017  . ADD (attention deficit disorder) 02/25/2017  . Marijuana use 02/13/2017  . Neuropathy, peripheral 05/07/2016  . Vitamin B12 deficiency 10/24/2015  . Fibromyalgia 08/30/2015  . Low HDL (under 40)  08/30/2015  . GERD (gastroesophageal reflux disease) 08/30/2015  . OP (osteoporosis) 06/30/2015  . Sleep apnea 06/30/2015  . Morbid (severe) obesity due to excess calories ([place]) 08/16/2014  . Low back pain with sciatica 08/04/2014  . Current tobacco use 08/04/2014  . Polysubstance abuse ([place]) 07/04/2014  . Psychoactive substance abuse ([place]) 07/04/2014  . Anxiety, generalized 11/24/2013  . COPD with asthma ([place]) 11/18/2013  . Arthritis of knee, degenerative 07/15/2013  . Osteoarthritis of knee, unspecified 07/15/2013    Social History   Tobacco Use  . Smoking status: Current Every Day Smoker    Packs/day: 1.00    Years: 36.00    Pack years: 36.00    Types: Cigarettes  . Smokeless tobacco: Never Used  Substance Use Topics  . Alcohol use: No    Alcohol/week: 0.0 standard drinks     Current Outpatient Medications:  .  albuterol (VENTOLIN HFA) 108 (90 Base) MCG/ACT inhaler, Inhale 2 puffs into the lungs every 4 (four) hours as needed for wheezing or shortness of breath., Disp: 18 g, Rfl: 2 .  ALPRAZolam (XANAX) 0.5 MG tablet, Take 0.5 mg by mouth 4 (four) times daily., Disp: , Rfl:  .  amantadine (SYMMETREL) 100 MG capsule, Take 100 mg by mouth 2 (two) times daily., Disp: , Rfl:  .  atorvastatin (LIPITOR) 40 MG tablet, Take 1 tablet (40 mg total) by mouth at bedtime., Disp: 90 tablet, Rfl: 3 .  benztropine (COGENTIN) 1 MG tablet, Take 1 mg by  mouth at bedtime., Disp: , Rfl:  .  carbidopa-levodopa (SINEMET IR) 25-100 MG tablet, Take by mouth., Disp: , Rfl:  .  divalproex (DEPAKOTE) 250 MG DR tablet, Take 1 tablet (250 mg total) by mouth 2 (two) times daily., Disp: 60 tablet, Rfl: 2 .  escitalopram (LEXAPRO) 20 MG tablet, Take 1 tablet (20 mg total) by mouth daily., Disp: 30 tablet, Rfl: 2 .  LATUDA 60 MG TABS, Take 60 mg by mouth at bedtime. , Disp: , Rfl:  .  Multiple Vitamin (MULTIVITAMIN WITH MINERALS) TABS tablet, Take 1 tablet by mouth daily., Disp: , Rfl:  .  promethazine  (PHENERGAN) 25 MG tablet, Take 25 mg by mouth 2 (two) times daily as needed for nausea or vomiting., Disp: , Rfl:  .  rOPINIRole (REQUIP XL) 4 MG 24 hr tablet, Take 4 mg by mouth daily., Disp: , Rfl:  .  Tiotropium Bromide Monohydrate (SPIRIVA RESPIMAT) 2.5 MCG/ACT AERS, INHHALE TWO PUFFS INTO LUNGS DAILY, Disp: 4 g, Rfl: 2 .  tiZANidine (ZANAFLEX) 2 MG tablet, TAKE 1 TO 2 TABLETS BY MOUTH 3 TIMES DAILY AS NEEDED FOR MUSLCE SPASMS, Disp: 60 tablet, Rfl: 1 .  ipratropium-albuterol (DUONEB) 0.5-2.5 (3) MG/3ML SOLN, Take 3 mLs by nebulization 3 (three) times daily as needed. (Patient not taking: No sig reported), Disp: 180 mL, Rfl: 1 .  Respiratory Therapy Supplies (NEBULIZER/TUBING/MOUTHPIECE) KIT, Disp one nebulizer machine, tubing set and mouthpiece kit (Patient not taking: No sig reported), Disp: 1 kit, Rfl: 0  Allergies  Allergen Reactions  . Chantix [Varenicline] Nausea Only  . Codeine Nausea And Vomiting    Chart Review: I personally reviewed active problem list, medication list, allergies, family history, social history, health maintenance, notes from last encounter, lab results, imaging with the patient/caregiver today. Personally reviewed last urine micro, labs trends over the past year, blood sugars, past vaginal testing Spent more than 5 min on chart review  Review of Systems   Objective:    Virtual encounter, vitals limited, only able to obtain the following There were no vitals filed for this visit. There is no height or weight on file to calculate BMI. Nursing Note and Vital Signs reviewed.  Physical Exam Phonation clear, pt alert, oriented appropriate, no audible distress PE limited by telephone encounter  No results found for this or any previous visit (from the past 72 hour(s)).  Assessment and Plan:     ICD-10-CM   1. Acute vaginitis  N76.0 fluconazole (DIFLUCAN) 100 MG tablet  2. Intertrigo  L30.4 nystatin (MYCOSTATIN/NYSTOP) powder    fluconazole (DIFLUCAN)  100 MG tablet   Plan reviewed with pt - provided in AVS and noted below: Pt instructions: Continue to keep skin aired out and dry - to the best of your ability - avoid clothing that rubs or causes friction  Start diflucan oral pills and nystatin powder today Do pills for 2 weeks - you can stop early (1 week) if symptoms resolve Do powder up to three times a day to skin folds for 1-2 weeks and you can use later if similar symptoms occur (so can restart treatment as needed - usually you will want to do for 1-2 weeks at a time)  You can apply jock itch over the counter cream - or jock itch treatment with steroids over the counter to areas of skin that are inflammed or have rash but where skin is not raw or broken (only skin folds do not apply to genitals)    If any symptoms  are worsening you need to let us know and you need to arrange transportation because I will need to do testing and examine you.   Info given on intertrigo and vaginitis  Pt was instructed to contact us on Monday if any worsening - and start to arrange transportation  Explained to pt that her symptoms cannot be address through the call center or multiple telephone messages, and for most new symptoms that do not resolve or that are bothersome - she needs to work on arranging transportation so that we can do in person eval and better assessment and treatment.  Limitations to all encounters being on telephone, and cannot do new assessments through call center messages - she will need to start making appts.  If she is unable to make appointments or keep her routine appts going forward, we will be unable to care for her.  -Red flags and when to present for emergency care or RTC including but not limited to new/worsening/un-resolving symptoms,  reviewed with patient at time of visit. Follow up and care instructions discussed and provided in AVS. - I discussed the assessment and treatment plan with the patient. The patient was  provided an opportunity to ask questions and all were answered. The patient agreed with the plan and demonstrated an understanding of the instructions.  - The patient was advised to call back or seek an in-person evaluation if the symptoms worsen or if the condition fails to improve as anticipated.  I provided 25+ minutes of non-face-to-face time during this encounter.  Delsa Grana, PA-C 04/01/20 10:53 AM

## 2020-04-01 NOTE — Patient Instructions (Signed)
Continue to keep skin aired out and dry - to the best of your ability - avoid clothing that rubs or causes friction  Start diflucan oral pills and nystatin powder today Do pills for 2 weeks - you can stop early (1 week) if symptoms resolve Do powder up to three times a day to skin folds for 1-2 weeks and you can use later if similar symptoms occur (so can restart treatment as needed - usually you will want to do for 1-2 weeks at a time)  You can apply jock itch over the counter cream - or jock itch treatment with steroids over the counter to areas of skin that are inflammed or have rash but where skin is not raw or broken (only skin folds do not apply to genitals)    If any symptoms are worsening you need to let us know and you need to arrange transportation because I will need to do testing and examine you.   Intertrigo Intertrigo is skin irritation (inflammation) that happens in warm, moist areas of the body. The irritation can cause a rash and make skin raw and itchy. The rash is usually pink or red. It happens mostly between folds of skin or where skin rubs together, such as:  Between the toes.  In the armpits.  In the groin area.  Under the belly.  Under the breasts.  Around the butt area. This condition is not passed from person to person (is not contagious). What are the causes?  Heat, moisture, rubbing, and not enough air movement.  The condition can be made worse by: ? Sweat. ? Bacteria. ? A fungus, such as yeast. What increases the risk?  Moisture in your skin folds.  You are more likely to develop this condition if you: ? Have diabetes. ? Are overweight. ? Are not able to move around. ? Live in a warm and moist climate. ? Wear splints, braces, or other medical devices. ? Are not able to control your pee (urine) or poop (stool). What are the signs or symptoms?  A pink or red skin rash in the skin fold or near the skin fold.  Raw or scaly  skin.  Itching.  A burning feeling.  Bleeding.  Leaking fluid.  A bad smell. How is this treated?  Cleaning and drying your skin.  Taking an antibiotic medicine or using an antibiotic skin cream for a bacterial infection.  Using an antifungal cream on your skin or taking pills for an infection that was caused by a fungus, such as yeast.  Using a steroid ointment to stop the itching and irritation.  Separating the skin fold with a clean cotton cloth to absorb moisture and allow air to flow into the area. Follow these instructions at home:  Keep the affected area clean and dry.  Do not scratch your skin.  Stay cool as much as you can. Use an air conditioner or a fan, if you have one.  Apply over-the-counter and prescription medicines only as told by your doctor.  If you were prescribed an antibiotic medicine, use it as told by your doctor. Do not stop using the antibiotic even if your condition starts to get better.  Keep all follow-up visits as told by your doctor. This is important. How is this prevented?  Stay at a healthy weight.  Take care of your feet. This is very important if you have diabetes. You should: ? Wear shoes that fit well. ? Keep your feet dry. ? Wear  clean cotton or wool socks.  Protect the skin in your groin and butt area as told by your doctor. To do this: ? Follow a regular cleaning routine. ? Use creams, powders, or ointments that protect your skin. ? Change protection pads often.  Do not wear tight clothes. Wear clothes that: ? Are loose. ? Take moisture away from your body. ? Are made of cotton.  Wear a bra that gives good support, if needed.  Shower and dry yourself well after being active. Use a hair dryer on a cool setting to dry between skin folds.  Keep your blood sugar under control if you have diabetes.   Contact a doctor if:  Your symptoms do not get better with treatment.  Your symptoms get worse or they spread.  You  notice more redness and warmth.  You have a fever. Summary  Intertrigo is skin irritation that occurs when folds of skin rub together.  This condition is caused by heat, moisture, and rubbing.  This condition may be treated by cleaning and drying your skin and with medicines.  Apply over-the-counter and prescription medicines only as told by your doctor.  Keep all follow-up visits as told by your doctor. This is important. This information is not intended to replace advice given to you by your health care provider. Make sure you discuss any questions you have with your health care provider. Document Revised: 11/21/2017 Document Reviewed: 11/21/2017 Elsevier Patient Education  2021 Elsevier Inc.    Vaginitis  Vaginitis is irritation and swelling of the vagina. Treatment will depend on the cause. What are the causes? It can be caused by:  Bacteria.  Yeast.  A parasite.  A virus.  Low hormone levels.  Bubble baths, scented tampons, and feminine sprays. Other things can change the balance of the yeast and bacteria that live in the vagina. These include:  Antibiotic medicines.  Not being clean enough.  Some birth control methods.  Sex.  Infection.  Diabetes.  A weakened body defense system (immune system). What increases the risk?  Smoking or being around someone who smokes.  Using washes (douches), scented tampons, or scented pads.  Wearing tight pants or thong underwear.  Using birth control pills or an IUD.  Having sex without a condom or having a lot of partners.  Having an STI.  Using a certain product to kill sperm (nonoxynol-9).  Eating foods that are high in sugar.  Having diabetes.  Having low levels of a female hormone.  Having a weakened body defense system.  Being pregnant or breastfeeding. What are the signs or symptoms?  Fluid coming from the vagina that is not normal.  A bad smell.  Itching, pain, or swelling.  Pain with  sex.  Pain or burning when you pee (urinate). Sometimes there are no symptoms. How is this treated? Treatment may include:  Antibiotic creams or pills.  Antifungal medicines.  Medicines to ease symptoms if you have a virus. Your sex partner should also be treated.  Estrogen medicines.  Avoiding scented soaps, sprays, or douches.  Stopping use of products that caused irritation and then using a cream to treat symptoms. Follow these instructions at home: Lifestyle  Keep the area around your vagina clean and dry. ? Avoid using soap. ? Rinse the area with water.  Until your doctor says it is okay: ? Do not use washes for the vagina. ? Do not use tampons. ? Do not have sex.  Wipe from front to back after  going to the bathroom.  When your doctor says it is okay, practice safe sex and use condoms. General instructions  Take over-the-counter and prescription medicines only as told by your doctor.  If you were prescribed an antibiotic medicine, take or use it as told by your doctor. Do not stop taking or using it even if you start to feel better.  Keep all follow-up visits. How is this prevented?  Do not use things that can irritate the vagina, such as fabric softeners. Avoid these products if they are scented: ? Sprays. ? Detergents. ? Tampons. ? Products for cleaning the vagina. ? Soaps or bubble baths.  Let air reach your vagina. To do this: ? Wear cotton underwear. ? Do not wear:  Underwear while you sleep.  Tight pants.  Thong underwear.  Underwear or nylons without a cotton panel. ? Take off any wet clothing, such as bathing suits, as soon as you can. ? Practice safe sex and use condoms. Contact a doctor if:  You have pain in your belly or in the area between your hips.  You have a fever or chills.  Your symptoms last for more than 2-3 days. Get help right away if:  You have a fever and your symptoms get worse all of a sudden. Summary  Vaginitis  is irritation and swelling of the vagina.  Treatment will depend on the cause of the condition.  Do not use washes or tampons or have sex until your doctor says it is okay. This information is not intended to replace advice given to you by your health care provider. Make sure you discuss any questions you have with your health care provider. Document Revised: 08/13/2019 Document Reviewed: 08/13/2019 Elsevier Patient Education  [place].

## 2020-04-11 ENCOUNTER — Ambulatory Visit: Payer: Medicaid Other | Admitting: Family Medicine

## 2020-04-13 ENCOUNTER — Telehealth: Payer: Self-pay

## 2020-04-13 NOTE — Progress Notes (Signed)
  Chronic Care Management Pharmacy Assistant   Name: Nicole Austin  MRN: [id number] DOB: 02/24/1961  Reason for Encounter: Medication Review /Initial question for initial visit with clinical pharmacist on 04/20/2020.   Patient Questions:  1.  Have you seen any other providers since your last visit? No  2.  Any changes in your medicines or health? No  .  PCP : Tapia, [name], PA-C  Allergies:   Allergies  Allergen Reactions  . Chantix [Varenicline] Nausea Only  . Codeine Nausea And Vomiting    Medications: Outpatient Encounter Medications as of 04/13/2020  Medication Sig  . albuterol (VENTOLIN HFA) 108 (90 Base) MCG/ACT inhaler Inhale 2 puffs into the lungs every 4 (four) hours as needed for wheezing or shortness of breath.  . ALPRAZolam (XANAX) 0.5 MG tablet Take 0.5 mg by mouth 4 (four) times daily.  . amantadine (SYMMETREL) 100 MG capsule Take 100 mg by mouth 2 (two) times daily.  . atorvastatin (LIPITOR) 40 MG tablet Take 1 tablet (40 mg total) by mouth at bedtime.  . benztropine (COGENTIN) 1 MG tablet Take 1 mg by mouth at bedtime.  . carbidopa-levodopa (SINEMET IR) 25-100 MG tablet Take by mouth.  . divalproex (DEPAKOTE) 250 MG DR tablet Take 1 tablet (250 mg total) by mouth 2 (two) times daily.  . escitalopram (LEXAPRO) 20 MG tablet Take 1 tablet (20 mg total) by mouth daily.  . fluconazole (DIFLUCAN) 100 MG tablet Take 200 mg (2 tabs) PO once today, then take 100 mg (1 tab) po q daily x 13 d  . ipratropium-albuterol (DUONEB) 0.5-2.5 (3) MG/3ML SOLN Take 3 mLs by nebulization 3 (three) times daily as needed. (Patient not taking: No sig reported)  . LATUDA 60 MG TABS Take 60 mg by mouth at bedtime.   . Multiple Vitamin (MULTIVITAMIN WITH MINERALS) TABS tablet Take 1 tablet by mouth daily.  . nystatin (MYCOSTATIN/NYSTOP) powder Apply 1 application topically 3 (three) times daily.  . promethazine (PHENERGAN) 25 MG tablet Take 25 mg by mouth 2 (two) times daily as needed  for nausea or vomiting.  . Respiratory Therapy Supplies (NEBULIZER/TUBING/MOUTHPIECE) KIT Disp one nebulizer machine, tubing set and mouthpiece kit (Patient not taking: No sig reported)  . rOPINIRole (REQUIP XL) 4 MG 24 hr tablet Take 4 mg by mouth daily.  . Tiotropium Bromide Monohydrate (SPIRIVA RESPIMAT) 2.5 MCG/ACT AERS INHHALE TWO PUFFS INTO LUNGS DAILY  . tiZANidine (ZANAFLEX) 2 MG tablet TAKE 1 TO 2 TABLETS BY MOUTH 3 TIMES DAILY AS NEEDED FOR MUSLCE SPASMS   No facility-administered encounter medications on file as of 04/13/2020.    Current Diagnosis: Patient Active Problem List   Diagnosis Date Noted  . Benign neoplasm of ascending colon   . Polyp of sigmoid colon   . Bipolar disorder with depression ([facility]) 02/25/2017  . ADD (attention deficit disorder) 02/25/2017  . Marijuana use 02/13/2017  . Neuropathy, peripheral 05/07/2016  . Vitamin B12 deficiency 10/24/2015  . Fibromyalgia 08/30/2015  . Low HDL (under 40) 08/30/2015  . GERD (gastroesophageal reflux disease) 08/30/2015  . OP (osteoporosis) 06/30/2015  . Sleep apnea 06/30/2015  . Morbid (severe) obesity due to excess calories ([facility]) 08/16/2014  . Low back pain with sciatica 08/04/2014  . Current tobacco use 08/04/2014  . Polysubstance abuse ([facility]) 07/04/2014  . Psychoactive substance abuse ([facility]) 07/04/2014  . Anxiety, generalized 11/24/2013  . COPD with asthma ([facility]) 11/18/2013  . Arthritis of knee, degenerative 07/15/2013  . Osteoarthritis of knee, unspecified 07/15/2013      Goals Addressed   None   Left Voice message to do initial question prior to patient appointment on 04/20/2020  for CCM at 2:00pm  with Alex Fleury the Clinical pharmacist.    LVM 02/16,02/21 ,02/22 Follow-Up:  Pharmacist Review   Bessie Kellihan,CPA Clinical Pharmacist Assistant [phone]   

## 2020-04-19 NOTE — Progress Notes (Signed)
 Chronic Care Management Pharmacy Note  04/25/2020 Name:  Nicole Austin MRN:  [id number] DOB:  08/24/1960  Subjective: Nicole Austin is an 60 y.o. year old female who is a primary patient of Tapia, [name], PA-C.  The CCM team was consulted for assistance with disease management and care coordination needs.    Engaged with patient by telephone for initial visit in response to provider referral for pharmacy case management and/or care coordination services.   Consent to Services:  The patient was given the following information about Chronic Care Management services today, agreed to services, and gave verbal consent: 1. CCM service includes personalized support from designated clinical staff supervised by the primary care provider, including individualized plan of care and coordination with other care providers 2. 24/7 contact phone numbers for assistance for urgent and routine care needs. 3. Service will only be billed when office clinical staff spend 20 minutes or more in a month to coordinate care. 4. Only one practitioner may furnish and bill the service in a calendar month. 5.The patient may stop CCM services at any time (effective at the end of the month) by phone call to the office staff. 6. The patient will be responsible for cost sharing (co-pay) of up to 20% of the service fee (after annual deductible is met). Patient agreed to services and consent obtained.  Patient Care Team: Tapia, [name], PA-C as PCP - General (Family Medicine) Tahiliani, Varnita B, MD as Consulting Physician (Gastroenterology) Shah, Hemang K, MD as Consulting Physician (Neurology) Su, Hansen, MD as Referring Physician (Psychiatry) Hambright, Jennifer L, LCSW as Social Worker (Professional Counselor) ,  A, RPH (Pharmacist) McCray, Felecia, RN as Registered Nurse  Recent office visits: 04/01/20: Video visit with [name] Tapia for vaginitis. Patient started on fluconazole and nystatin.   02/02/20: Patient presented to Dr. Hendrickson for COPD exacerbation. Patient started on doxycycline and prednisone.   Recent consult visits: None in previous 6 months   Hospital visits: None in previous 6 months   Subjective:   Patient reports she has been sleeping a lot more recently, feeling very low energy. She has been stressed because she will have to take care of sister following her sister undergoing a TKR.    Objective:  Lab Results  Component Value Date   CREATININE 0.94 12/07/2019   BUN 10 12/07/2019   GFRNONAA 66 12/07/2019   GFRAA 76 12/07/2019   NA 137 12/07/2019   K 4.0 12/07/2019   CALCIUM 9.3 12/07/2019   CO2 30 12/07/2019    Lab Results  Component Value Date/Time   HGBA1C 5.6 05/14/2019 08:42 PM   HGBA1C 5.5 02/27/2018 03:08 PM   HGBA1C 5.7 10/21/2013 04:06 AM   MICROALBUR CANCELED 10/04/2015 04:07 PM   MICROALBUR <0.2 10/04/2015 04:07 PM    Last diabetic Eye exam: No results found for: HMDIABEYEEXA  Last diabetic Foot exam: No results found for: HMDIABFOOTEX   Lab Results  Component Value Date   CHOL 117 12/07/2019   HDL 31 (L) 12/07/2019   LDLCALC 58 12/07/2019   TRIG 216 (H) 12/07/2019   CHOLHDL 3.8 12/07/2019    Hepatic Function Latest Ref Rng & Units 12/07/2019 08/06/2019 05/14/2019  Total Protein 6.1 - 8.1 g/dL 6.6 6.7 7.0  Albumin 3.5 - 5.0 g/dL - - 3.4(L)  AST 10 - 35 U/L 12 18 27  ALT 6 - 29 U/L 9 11 26  Alk Phosphatase 38 - 126 U/L - - 73  Total Bilirubin 0.2 - 1.2 mg/dL   Chronic Care Management Pharmacy Note  04/25/2020 Name:  Nicole Austin MRN:  [id number] DOB:  [birth year]  Subjective: Nicole Austin is an 60 y.o. year old female who is a primary patient of Delsa Grana, [place].  The CCM team was consulted for assistance with disease management and care coordination needs.    Engaged with patient by telephone for initial visit in response to provider referral for pharmacy case management and/or care coordination services.   Consent to Services:  The patient was given the following information about Chronic Care Management services today, agreed to services, and gave verbal consent: 1. CCM service includes personalized support from designated clinical staff supervised by the primary care provider, including individualized plan of care and coordination with other care providers 2. 24/7 contact phone numbers for assistance for urgent and routine care needs. 3. Service will only be billed when office clinical staff spend 20 minutes or more in a month to coordinate care. 4. Only one practitioner may furnish and bill the service in a calendar month. 5.The patient may stop CCM services at any time (effective at the end of the month) by phone call to the office staff. 6. The patient will be responsible for cost sharing (co-pay) of up to 20% of the service fee (after annual deductible is met). Patient agreed to services and consent obtained.  Patient Care Team: Delsa Grana, PA-C as PCP - General (Family Medicine) Virgel Manifold, MD as Consulting Physician (Gastroenterology) Vladimir Crofts, MD as Consulting Physician (Neurology) Myer Haff, MD as Referring Physician (Psychiatry) Colin Ina Stephani Police, LCSW as Social Worker (Professional Counselor) Germaine Pomfret, [facility] (Pharmacist) Neldon Labella, RN as Registered Nurse  Recent office visits: 04/01/20: Video visit with Delsa Grana for vaginitis. Patient started on fluconazole and nystatin.   02/02/20: Patient presented to Dr. Roxan Hockey for COPD exacerbation. Patient started on doxycycline and prednisone.   Recent consult visits: None in previous 6 months   Hospital visits: None in previous 6 months   Subjective:   Patient reports she has been sleeping a lot more recently, feeling very low energy. She has been stressed because she will have to take care of sister following her sister undergoing a TKR.    Objective:  Lab Results  Component Value Date   CREATININE 0.94 12/07/2019   BUN 10 12/07/2019   GFRNONAA 66 12/07/2019   GFRAA 76 12/07/2019   NA 137 12/07/2019   K 4.0 12/07/2019   CALCIUM 9.3 12/07/2019   CO2 30 12/07/2019    Lab Results  Component Value Date/Time   HGBA1C 5.6 05/14/2019 08:42 PM   HGBA1C 5.5 02/27/2018 03:08 PM   HGBA1C 5.7 10/21/2013 04:06 AM   MICROALBUR CANCELED 10/04/2015 04:07 PM   MICROALBUR <0.2 10/04/2015 04:07 PM    Last diabetic Eye exam: No results found for: HMDIABEYEEXA  Last diabetic Foot exam: No results found for: HMDIABFOOTEX   Lab Results  Component Value Date   CHOL 117 12/07/2019   HDL 31 (L) 12/07/2019   LDLCALC 58 12/07/2019   TRIG 216 (H) 12/07/2019   CHOLHDL 3.8 12/07/2019    Hepatic Function Latest Ref Rng & Units 12/07/2019 08/06/2019 05/14/2019  Total Protein 6.1 - 8.1 g/dL 6.6 6.7 7.0  Albumin 3.5 - 5.0 g/dL - - 3.4(L)  AST 10 - 35 U/L _0 ALT 6 - 29 U/L _1 Alk Phosphatase 38 - 126 U/L - - 73  Total Bilirubin 0.2 - 1.2 mg/dL  Chronic Care Management Pharmacy Note  04/25/2020 Name:  Nicole Austin MRN:  [id number] DOB:  [birth year]  Subjective: Nicole Austin is an 60 y.o. year old female who is a primary patient of Delsa Grana, [place].  The CCM team was consulted for assistance with disease management and care coordination needs.    Engaged with patient by telephone for initial visit in response to provider referral for pharmacy case management and/or care coordination services.   Consent to Services:  The patient was given the following information about Chronic Care Management services today, agreed to services, and gave verbal consent: 1. CCM service includes personalized support from designated clinical staff supervised by the primary care provider, including individualized plan of care and coordination with other care providers 2. 24/7 contact phone numbers for assistance for urgent and routine care needs. 3. Service will only be billed when office clinical staff spend 20 minutes or more in a month to coordinate care. 4. Only one practitioner may furnish and bill the service in a calendar month. 5.The patient may stop CCM services at any time (effective at the end of the month) by phone call to the office staff. 6. The patient will be responsible for cost sharing (co-pay) of up to 20% of the service fee (after annual deductible is met). Patient agreed to services and consent obtained.  Patient Care Team: Delsa Grana, PA-C as PCP - General (Family Medicine) Virgel Manifold, MD as Consulting Physician (Gastroenterology) Vladimir Crofts, MD as Consulting Physician (Neurology) Myer Haff, MD as Referring Physician (Psychiatry) Colin Ina Stephani Police, LCSW as Social Worker (Professional Counselor) Germaine Pomfret, [facility] (Pharmacist) Neldon Labella, RN as Registered Nurse  Recent office visits: 04/01/20: Video visit with Delsa Grana for vaginitis. Patient started on fluconazole and nystatin.   02/02/20: Patient presented to Dr. Roxan Hockey for COPD exacerbation. Patient started on doxycycline and prednisone.   Recent consult visits: None in previous 6 months   Hospital visits: None in previous 6 months   Subjective:   Patient reports she has been sleeping a lot more recently, feeling very low energy. She has been stressed because she will have to take care of sister following her sister undergoing a TKR.    Objective:  Lab Results  Component Value Date   CREATININE 0.94 12/07/2019   BUN 10 12/07/2019   GFRNONAA 66 12/07/2019   GFRAA 76 12/07/2019   NA 137 12/07/2019   K 4.0 12/07/2019   CALCIUM 9.3 12/07/2019   CO2 30 12/07/2019    Lab Results  Component Value Date/Time   HGBA1C 5.6 05/14/2019 08:42 PM   HGBA1C 5.5 02/27/2018 03:08 PM   HGBA1C 5.7 10/21/2013 04:06 AM   MICROALBUR CANCELED 10/04/2015 04:07 PM   MICROALBUR <0.2 10/04/2015 04:07 PM    Last diabetic Eye exam: No results found for: HMDIABEYEEXA  Last diabetic Foot exam: No results found for: HMDIABFOOTEX   Lab Results  Component Value Date   CHOL 117 12/07/2019   HDL 31 (L) 12/07/2019   LDLCALC 58 12/07/2019   TRIG 216 (H) 12/07/2019   CHOLHDL 3.8 12/07/2019    Hepatic Function Latest Ref Rng & Units 12/07/2019 08/06/2019 05/14/2019  Total Protein 6.1 - 8.1 g/dL 6.6 6.7 7.0  Albumin 3.5 - 5.0 g/dL - - 3.4(L)  AST 10 - 35 U/L _0 ALT 6 - 29 U/L _1 Alk Phosphatase 38 - 126 U/L - - 73  Total Bilirubin 0.2 - 1.2 mg/dL  Chronic Care Management Pharmacy Note  04/25/2020 Name:  Nicole Austin MRN:  [id number] DOB:  [birth year]  Subjective: Nicole Austin is an 60 y.o. year old female who is a primary patient of Delsa Grana, [place].  The CCM team was consulted for assistance with disease management and care coordination needs.    Engaged with patient by telephone for initial visit in response to provider referral for pharmacy case management and/or care coordination services.   Consent to Services:  The patient was given the following information about Chronic Care Management services today, agreed to services, and gave verbal consent: 1. CCM service includes personalized support from designated clinical staff supervised by the primary care provider, including individualized plan of care and coordination with other care providers 2. 24/7 contact phone numbers for assistance for urgent and routine care needs. 3. Service will only be billed when office clinical staff spend 20 minutes or more in a month to coordinate care. 4. Only one practitioner may furnish and bill the service in a calendar month. 5.The patient may stop CCM services at any time (effective at the end of the month) by phone call to the office staff. 6. The patient will be responsible for cost sharing (co-pay) of up to 20% of the service fee (after annual deductible is met). Patient agreed to services and consent obtained.  Patient Care Team: Delsa Grana, PA-C as PCP - General (Family Medicine) Virgel Manifold, MD as Consulting Physician (Gastroenterology) Vladimir Crofts, MD as Consulting Physician (Neurology) Myer Haff, MD as Referring Physician (Psychiatry) Colin Ina Stephani Police, LCSW as Social Worker (Professional Counselor) Germaine Pomfret, [facility] (Pharmacist) Neldon Labella, RN as Registered Nurse  Recent office visits: 04/01/20: Video visit with Delsa Grana for vaginitis. Patient started on fluconazole and nystatin.   02/02/20: Patient presented to Dr. Roxan Hockey for COPD exacerbation. Patient started on doxycycline and prednisone.   Recent consult visits: None in previous 6 months   Hospital visits: None in previous 6 months   Subjective:   Patient reports she has been sleeping a lot more recently, feeling very low energy. She has been stressed because she will have to take care of sister following her sister undergoing a TKR.    Objective:  Lab Results  Component Value Date   CREATININE 0.94 12/07/2019   BUN 10 12/07/2019   GFRNONAA 66 12/07/2019   GFRAA 76 12/07/2019   NA 137 12/07/2019   K 4.0 12/07/2019   CALCIUM 9.3 12/07/2019   CO2 30 12/07/2019    Lab Results  Component Value Date/Time   HGBA1C 5.6 05/14/2019 08:42 PM   HGBA1C 5.5 02/27/2018 03:08 PM   HGBA1C 5.7 10/21/2013 04:06 AM   MICROALBUR CANCELED 10/04/2015 04:07 PM   MICROALBUR <0.2 10/04/2015 04:07 PM    Last diabetic Eye exam: No results found for: HMDIABEYEEXA  Last diabetic Foot exam: No results found for: HMDIABFOOTEX   Lab Results  Component Value Date   CHOL 117 12/07/2019   HDL 31 (L) 12/07/2019   LDLCALC 58 12/07/2019   TRIG 216 (H) 12/07/2019   CHOLHDL 3.8 12/07/2019    Hepatic Function Latest Ref Rng & Units 12/07/2019 08/06/2019 05/14/2019  Total Protein 6.1 - 8.1 g/dL 6.6 6.7 7.0  Albumin 3.5 - 5.0 g/dL - - 3.4(L)  AST 10 - 35 U/L _0 ALT 6 - 29 U/L _1 Alk Phosphatase 38 - 126 U/L - - 73  Total Bilirubin 0.2 - 1.2 mg/dL   Chronic Care Management Pharmacy Note  04/25/2020 Name:  Nicole Austin MRN:  [id number] DOB:  08/24/1960  Subjective: Nicole Austin is an 60 y.o. year old female who is a primary patient of Tapia, [name], PA-C.  The CCM team was consulted for assistance with disease management and care coordination needs.    Engaged with patient by telephone for initial visit in response to provider referral for pharmacy case management and/or care coordination services.   Consent to Services:  The patient was given the following information about Chronic Care Management services today, agreed to services, and gave verbal consent: 1. CCM service includes personalized support from designated clinical staff supervised by the primary care provider, including individualized plan of care and coordination with other care providers 2. 24/7 contact phone numbers for assistance for urgent and routine care needs. 3. Service will only be billed when office clinical staff spend 20 minutes or more in a month to coordinate care. 4. Only one practitioner may furnish and bill the service in a calendar month. 5.The patient may stop CCM services at any time (effective at the end of the month) by phone call to the office staff. 6. The patient will be responsible for cost sharing (co-pay) of up to 20% of the service fee (after annual deductible is met). Patient agreed to services and consent obtained.  Patient Care Team: Tapia, [name], PA-C as PCP - General (Family Medicine) Tahiliani, Varnita B, MD as Consulting Physician (Gastroenterology) Shah, Hemang K, MD as Consulting Physician (Neurology) Su, Hansen, MD as Referring Physician (Psychiatry) Hambright, Jennifer L, LCSW as Social Worker (Professional Counselor) ,  A, RPH (Pharmacist) McCray, Felecia, RN as Registered Nurse  Recent office visits: 04/01/20: Video visit with [name] Tapia for vaginitis. Patient started on fluconazole and nystatin.   02/02/20: Patient presented to Dr. Hendrickson for COPD exacerbation. Patient started on doxycycline and prednisone.   Recent consult visits: None in previous 6 months   Hospital visits: None in previous 6 months   Subjective:   Patient reports she has been sleeping a lot more recently, feeling very low energy. She has been stressed because she will have to take care of sister following her sister undergoing a TKR.    Objective:  Lab Results  Component Value Date   CREATININE 0.94 12/07/2019   BUN 10 12/07/2019   GFRNONAA 66 12/07/2019   GFRAA 76 12/07/2019   NA 137 12/07/2019   K 4.0 12/07/2019   CALCIUM 9.3 12/07/2019   CO2 30 12/07/2019    Lab Results  Component Value Date/Time   HGBA1C 5.6 05/14/2019 08:42 PM   HGBA1C 5.5 02/27/2018 03:08 PM   HGBA1C 5.7 10/21/2013 04:06 AM   MICROALBUR CANCELED 10/04/2015 04:07 PM   MICROALBUR <0.2 10/04/2015 04:07 PM    Last diabetic Eye exam: No results found for: HMDIABEYEEXA  Last diabetic Foot exam: No results found for: HMDIABFOOTEX   Lab Results  Component Value Date   CHOL 117 12/07/2019   HDL 31 (L) 12/07/2019   LDLCALC 58 12/07/2019   TRIG 216 (H) 12/07/2019   CHOLHDL 3.8 12/07/2019    Hepatic Function Latest Ref Rng & Units 12/07/2019 08/06/2019 05/14/2019  Total Protein 6.1 - 8.1 g/dL 6.6 6.7 7.0  Albumin 3.5 - 5.0 g/dL - - 3.4(L)  AST 10 - 35 U/L 12 18 27  ALT 6 - 29 U/L 9 11 26  Alk Phosphatase 38 - 126 U/L - - 73  Total Bilirubin 0.2 - 1.2 mg/dL

## 2020-04-20 ENCOUNTER — Ambulatory Visit (INDEPENDENT_AMBULATORY_CARE_PROVIDER_SITE_OTHER): Payer: Medicare Other

## 2020-04-20 ENCOUNTER — Ambulatory Visit: Payer: Self-pay

## 2020-04-20 DIAGNOSIS — J449 Chronic obstructive pulmonary disease, unspecified: Secondary | ICD-10-CM | POA: Diagnosis not present

## 2020-04-20 DIAGNOSIS — F319 Bipolar disorder, unspecified: Secondary | ICD-10-CM

## 2020-04-20 DIAGNOSIS — J4489 Other specified chronic obstructive pulmonary disease: Secondary | ICD-10-CM

## 2020-04-21 NOTE — Patient Instructions (Addendum)
Visit Information It was great speaking with you today!  Please let me know if you have any questions about our visit.  Goals Addressed            This Visit's Progress   . Track and Manage My Triggers-COPD       Timeframe:  Long-Range Goal Priority:  High Start Date: 04/20/2020                            Expected End Date: 10/19/2020                      Follow Up Date 06/25/2020    - eliminate smoking in my home - identify and remove indoor air pollutants - limit outdoor activity during cold weather    Why is this important?    Triggers are activities or things, like tobacco smoke or cold weather, that make your COPD (chronic obstructive pulmonary disease) flare-up.   Knowing these triggers helps you plan how to stay away from them.   When you cannot remove them, you can learn how to manage them.     Notes:        Patient Care Plan: General Pharmacy (Adult)    Problem Identified: GERD, COPD, Asthma, Depression, Anxiety, Osteoporosis, Osteoarthritis, Tobacco use and Bipolar disorder, and ADD     Long-Range Goal: Patient-Specific Goal   Start Date: 04/20/2020  Expected End Date: 10/19/2020  This Visit's Progress: On track  Priority: High  Note:   Current Barriers:  . Does not adhere to prescribed medication regimen  Pharmacist Clinical Goal(s):  Marland Kitchen Over the next 90 days, patient will adhere to prescribed medication regimen as evidenced by improved mood through collaboration with PharmD and provider.  Interventions: . 1:1 collaboration with Delsa Grana, PA-C regarding development and update of comprehensive plan of care as evidenced by provider attestation and co-signature . Inter-disciplinary care team collaboration (see longitudinal plan of care) . Comprehensive medication review performed; medication list updated in electronic medical record  COPD (Goal: control symptoms and prevent exacerbations) -controlled -Current treatment  . Ventolin HFA 108 mcg/act 2  puff every 4 hours as needed  . Duoneb 64mL nebulizer three times daily as needed  . Spiriva 2.5 mcg/act 2 puffs daily  -Medications previously tried: NA  -Gold Grade: NA -Current COPD Classification: NA -MMRC/CAT score: NA -Pulmonary function testing: NA -Exacerbations requiring treatment in last 6 months: None noted -Patient denies consistent use of maintenance inhaler -Frequency of rescue inhaler use: Not using frequently.  -Frequency of nebulizer: twice in past two weeks  -Counseled on Benefits of consistent maintenance inhaler use When to use rescue inhaler -Recommended to continue current medication  Depression/Anxiety (Goal: maintain stable mood) -Managed by Dr. Kasandra Knudsen  -uncontrolled -Current treatment: . Alprazolam 0.5 mg four times daily  . Benztropine 1 mg at bedtime  . Divalproex DR 250 mg twice daily (ineffective)  . Escitalopram 20 mg daily (ineffective)    . Latuda 60 mg at bedtime  -Medications previously tried/failed: NA -PHQ9: 19 -GAD7: 2 -Connected with Dr. Kasandra Knudsen for mental health support -Missed follow-up with psychiatry  -Patient reports divalproex and Lexapro have been ineffective -Patient reports discontinuing benztropine.  -Educated on Benefits of medication for symptom control Benefits of cognitive-behavioral therapy with or without medication -Recommended to continue current medication Recommended rescheduling follow-up with psychiatry   Osteoporosis (Goal maintain bone density and prevent fractures) -uncontrolled -Last DEXA Scan: NA  T-Score femoral neck: NA  T-Score total hip: NA  T-Score lumbar spine: NA  T-Score forearm radius: NA  10-year probability of major osteoporotic fracture: NA  10-year probability of hip fracture: NA -Current treatment  . None -Medications previously tried: NA  -Recommend [phone] units of vitamin D daily. Recommend 1200 mg of calcium daily from dietary and supplemental sources. Recommend rescheduling DEXA  scan. -Counseled on diet and exercise extensively  Parkinson's Disease (Goal: maintain quality of life) -Managed by Dr. Manuella Ghazi and Dr. Sunday Corn -controlled -Current treatment  . Amantadine 100 mg twice daily  . Ropinirole XL 4 mg daily -Medications previously tried: Sinemet IR 25-100 mg ("Makes me feel weird")   -Recommended to continue current medication  Patient Goals/Self-Care Activities . Over the next 90 days, patient will:  - take medications as prescribed  Follow Up Plan: Telephone follow up appointment with care management team member scheduled for: 10/19/2020 at 1:00 PM      Ms. Pinnock was given information about Chronic Care Management services today including:  1. CCM service includes personalized support from designated clinical staff supervised by her physician, including individualized plan of care and coordination with other care providers 2. 24/7 contact phone numbers for assistance for urgent and routine care needs. 3. Standard insurance, coinsurance, copays and deductibles apply for chronic care management only during months in which we provide at least 20 minutes of these services. Most insurances cover these services at 100%, however patients may be responsible for any copay, coinsurance and/or deductible if applicable. This service may help you avoid the need for more expensive face-to-face services. 4. Only one practitioner may furnish and bill the service in a calendar month. 5. The patient may stop CCM services at any time (effective at the end of the month) by phone call to the office staff.  Patient agreed to services and verbal consent obtained.   The patient verbalized understanding of instructions, educational materials, and care plan provided today and declined offer to receive copy of patient instructions, educational materials, and care plan.   [facility] [phone]

## 2020-04-22 ENCOUNTER — Other Ambulatory Visit: Payer: Self-pay

## 2020-04-22 DIAGNOSIS — J45909 Unspecified asthma, uncomplicated: Secondary | ICD-10-CM | POA: Diagnosis not present

## 2020-04-22 DIAGNOSIS — F17210 Nicotine dependence, cigarettes, uncomplicated: Secondary | ICD-10-CM | POA: Insufficient documentation

## 2020-04-22 DIAGNOSIS — R21 Rash and other nonspecific skin eruption: Secondary | ICD-10-CM | POA: Diagnosis present

## 2020-04-22 DIAGNOSIS — M793 Panniculitis, unspecified: Secondary | ICD-10-CM | POA: Diagnosis not present

## 2020-04-22 DIAGNOSIS — B372 Candidiasis of skin and nail: Secondary | ICD-10-CM | POA: Diagnosis not present

## 2020-04-22 DIAGNOSIS — J449 Chronic obstructive pulmonary disease, unspecified: Secondary | ICD-10-CM | POA: Insufficient documentation

## 2020-04-22 NOTE — ED Triage Notes (Signed)
Reports redness and weeping to L groin. Ongoing x 1 wk with treatment by pcp for fungal or yeast infx (pt unsure which). Pt ambulatory in triage, breathing unlabored with symmetric chest rise and fall. Denies pain and is in NAD.

## 2020-04-22 NOTE — ED Notes (Signed)
First RN note: per ems pt complains of yeast infection in perineum. Vitals: 122/70, 90, 95% on ra. Pt ambulatory in no acute distress.

## 2020-04-23 ENCOUNTER — Emergency Department
Admission: EM | Admit: 2020-04-23 | Discharge: 2020-04-23 | Disposition: A | Discharge status: Home / Self Care | Payer: Medicare Other | Attending: Emergency Medicine | Admitting: Emergency Medicine

## 2020-04-23 DIAGNOSIS — M793 Panniculitis, unspecified: Secondary | ICD-10-CM

## 2020-04-23 DIAGNOSIS — B372 Candidiasis of skin and nail: Secondary | ICD-10-CM | POA: Diagnosis not present

## 2020-04-23 LAB — CBG MONITORING, ED: Glucose-Capillary: 105 mg/dL — ABNORMAL HIGH (ref 70–99)

## 2020-04-23 MED ORDER — NYSTATIN-TRIAMCINOLONE 100000-0.1 UNIT/GM-% EX OINT: 1.0000 "application " | TOPICAL_OINTMENT | Freq: Two times a day (BID) | CUTANEOUS | 1 refills | Status: DC

## 2020-04-23 MED ORDER — CEPHALEXIN 500 MG PO CAPS: 500.0000 mg | ORAL_CAPSULE | Freq: Four times a day (QID) | ORAL | 0 refills | Status: AC

## 2020-04-23 MED ORDER — FLUCONAZOLE 50 MG PO TABS
150.0000 mg | ORAL_TABLET | Freq: Once | ORAL | Status: AC
  Administered 2020-04-23: 150 mg via ORAL
  Filled 2020-04-23: qty 1

## 2020-04-23 MED ORDER — CEPHALEXIN 500 MG PO CAPS
500.0000 mg | ORAL_CAPSULE | Freq: Once | ORAL | Status: AC
  Administered 2020-04-23: 500 mg via ORAL
  Filled 2020-04-23: qty 1

## 2020-04-23 NOTE — ED Notes (Signed)
Pt ambulatory around lobby in no acute distress.

## 2020-04-23 NOTE — Discharge Instructions (Signed)
You may alternate Tylenol 1000 mg every 6 hours as needed for pain, fever and Ibuprofen 800 mg every 8 hours as needed for pain, fever.  Please take Ibuprofen with food.  Do not take more than 4000 mg of Tylenol (acetaminophen) in a 24 hour period.  Please keep this area clean and dry.  Please take your antibiotics until complete.

## 2020-04-23 NOTE — ED Provider Notes (Signed)
[facility] Emergency Department Provider Note  ____________________________________________   Event Date/Time   First MD Initiated Contact with Patient 04/23/20 [phone]     (approximate)  I have reviewed the triage vital signs and the nursing notes.   HISTORY  Chief Complaint Skin Problem    HPI Nicole Austin is a 60 y.o. female with history of asthma, bipolar disorder, COPD, prediabetes who presents to the emergency department with pruritic, painful rash underneath her pannus and in the right inguinal area for 3 weeks.  She took a 10-day course of Diflucan and has been using nystatin powder without relief.  No fevers.  No new exposures.        Past Medical History:  Diagnosis Date  . ADHD (attention deficit hyperactivity disorder)   . Apnea, sleep 06/30/2015  . Arthritis   . Asthma   . Asthma with acute exacerbation 06/30/2015  . Bipolar 1 disorder ([place])   . Chronic pain   . COPD (chronic obstructive pulmonary disease) ([place]) 11/2018   **LUNG SOUNDS WHEEZING AND WITH RALES**  . COPD, moderate ([place]) 11/18/2013   Overview:  Last Assessment & Plan:  Refer to pulmonologist Overview:  Overview:  Last Assessment & Plan:  Pneumonia vaccine offered and given today; she did not tolerate LABA; will start her on Spiriva; stop combivent neb; use only SABA neb OR inhaler, not both; smoking cessation urged, and I am here to help if she wants assistance quitting; f/u in 4 weeks Last Assessment & Plan:  Pneumonia vaccin  . Depression   . Dyspnea    with exertion  . GERD (gastroesophageal reflux disease) 08/30/2015  . Gout 08/16/2014  . History of acute myocardial infarction 06/30/2015   Overview:  [facility]   . HPV (human papilloma virus) infection 07/17/2013  . Hyperlipidemia   . Low HDL (under 40) 08/30/2015  . MI (myocardial infarction) ([place]) 2018  . Parkinson disease ([place])   . Pre-diabetes   . Prediabetes 05/20/2016  . PTSD (post-traumatic stress disorder)     history of panic attacks  . PTSD (post-traumatic stress disorder)   . PTSD (post-traumatic stress disorder)   . Restless leg syndrome   . Rhabdomyolysis   . Sleep apnea 06/30/2015  . Stage 3 severe COPD by GOLD classification ([place]) 11/18/2013  . Stroke ([place]) 2018  . Tremors of nervous system    hands  . Uncomplicated asthma 0/0/9381  . Varicella 02/25/2017    Patient Active Problem List   Diagnosis Date Noted  . Benign neoplasm of ascending colon   . Polyp of sigmoid colon   . Bipolar disorder with depression ([place]) 02/25/2017  . ADD (attention deficit disorder) 02/25/2017  . Marijuana use 02/13/2017  . Neuropathy, peripheral 05/07/2016  . Vitamin B12 deficiency 10/24/2015  . Fibromyalgia 08/30/2015  . Low HDL (under 40) 08/30/2015  . GERD (gastroesophageal reflux disease) 08/30/2015  . OP (osteoporosis) 06/30/2015  . Sleep apnea 06/30/2015  . Morbid (severe) obesity due to excess calories ([place]) 08/16/2014  . Low back pain with sciatica 08/04/2014  . Current tobacco use 08/04/2014  . Polysubstance abuse ([place]) 07/04/2014  . Psychoactive substance abuse ([place]) 07/04/2014  . Anxiety, generalized 11/24/2013  . COPD with asthma ([place]) 11/18/2013  . Arthritis of knee, degenerative 07/15/2013  . Osteoarthritis of knee, unspecified 07/15/2013    Past Surgical History:  Procedure Laterality Date  . BACK SURGERY    . CATARACT EXTRACTION W/PHACO Right 01/01/2019   Procedure: CATARACT EXTRACTION PHACO AND INTRAOCULAR  LENS PLACEMENT (IOC) right vision blue;  Surgeon: Marchia Meiers, MD;  Location: [facility] ORS;  Service: Ophthalmology;  Laterality: Right;  [place] 00:39.4 CDE 5.49 Fluid Pack lot # [id number]  . CATARACT EXTRACTION W/PHACO Left 01/29/2019   Procedure: CATARACT EXTRACTION PHACO AND INTRAOCULAR LENS PLACEMENT ([place]) LEFT Vision Blue;  Surgeon: Marchia Meiers, MD;  Location: [facility] ORS;  Service: Ophthalmology;  Laterality: Left;  [place] 00:51.1 CDE 4.38 Fluid Pack Lot # [id number]  . COLONOSCOPY  WITH PROPOFOL N/A 08/07/2017   Procedure: COLONOSCOPY WITH PROPOFOL;  Surgeon: Virgel Manifold, MD;  Location: [facility] ENDOSCOPY;  Service: Endoscopy;  Laterality: N/A;  . EYE SURGERY    . JOINT REPLACEMENT Left 2016   tkr  . REPLACEMENT TOTAL KNEE Left 2016    Prior to Admission medications   Medication Sig Start Date End Date Taking? Authorizing Provider  cephALEXin (KEFLEX) 500 MG capsule Take 1 capsule (500 mg total) by mouth 4 (four) times daily for 7 days. 04/23/20 04/30/20 Yes Deandrae Wajda, Delice Bison, DO  nystatin-triamcinolone ointment (MYCOLOG) Apply 1 application topically 2 (two) times daily. 04/23/20  Yes Rin Gorton, Cyril Mourning N, DO  albuterol (VENTOLIN HFA) 108 (90 Base) MCG/ACT inhaler Inhale 2 puffs into the lungs every 4 (four) hours as needed for wheezing or shortness of breath. 02/04/20   Delsa Grana, PA-C  ALPRAZolam Duanne Moron) 0.5 MG tablet Take 0.5 mg by mouth 4 (four) times daily.    [provider]  amantadine (SYMMETREL) 100 MG capsule Take 100 mg by mouth 2 (two) times daily. 03/21/20   [provider]  atorvastatin (LIPITOR) 40 MG tablet Take 1 tablet (40 mg total) by mouth at bedtime. 08/10/19   Delsa Grana, PA-C  benztropine (COGENTIN) 1 MG tablet Take 1 mg by mouth at bedtime. Patient not taking: Reported on 04/20/2020    [provider]  carbidopa-levodopa (SINEMET IR) 25-100 MG tablet Take by mouth. Patient not taking: Reported on 04/20/2020 03/22/20 03/22/21  [provider]  divalproex (DEPAKOTE) 250 MG DR tablet Take 1 tablet (250 mg total) by mouth 2 (two) times daily. 11/19/16   Rainey Pines, MD  escitalopram (LEXAPRO) 20 MG tablet Take 1 tablet (20 mg total) by mouth daily. 11/19/16   Rainey Pines, MD  ipratropium-albuterol (DUONEB) 0.5-2.5 (3) MG/3ML SOLN Take 3 mLs by nebulization 3 (three) times daily as needed. 12/07/19   Delsa Grana, PA-C  LATUDA 60 MG TABS Take 60 mg by mouth at bedtime.  01/19/19   [provider]  Multiple Vitamin  (MULTIVITAMIN WITH MINERALS) TABS tablet Take 1 tablet by mouth daily.    [provider]  promethazine (PHENERGAN) 25 MG tablet Take 25 mg by mouth 2 (two) times daily as needed for nausea or vomiting.    [provider]  Respiratory Therapy Supplies (NEBULIZER/TUBING/MOUTHPIECE) KIT Disp one nebulizer machine, tubing set and mouthpiece kit Patient not taking: No sig reported 12/07/19   Delsa Grana, PA-C  rOPINIRole (REQUIP XL) 4 MG 24 hr tablet Take 4 mg by mouth daily. 02/23/20   [provider]  Tiotropium Bromide Monohydrate (SPIRIVA RESPIMAT) 2.5 MCG/ACT AERS [facility] TWO PUFFS INTO LUNGS DAILY 02/23/20   Delsa Grana, PA-C  tiZANidine (ZANAFLEX) 2 MG tablet TAKE 1 TO 2 TABLETS BY MOUTH 3 TIMES DAILY AS NEEDED FOR MUSLCE SPASMS 02/23/20   Delsa Grana, PA-C    Allergies Chantix [varenicline] and Codeine  Family History  Problem Relation Age of Onset  . Hernia Mother   . Heart disease Mother   .  OCD Mother   . Diabetes Mother   . Parkinson's disease Father   . Bipolar disorder Sister   . Schizophrenia Sister   . ADD / ADHD Sister   . Alcohol abuse Brother   . Bipolar disorder Sister   . Paranoid behavior Sister   . ADD / ADHD Sister   . ADD / ADHD Son   . Dementia Maternal Grandmother   . Emphysema Maternal Grandfather   . ADD / ADHD Son   . ADD / ADHD Son   . Depression Son     Social History Social History   Tobacco Use  . Smoking status: Current Every Day Smoker    Packs/day: 1.00    Years: 36.00    Pack years: 36.00    Types: Cigarettes  . Smokeless tobacco: Never Used  Vaping Use  . Vaping Use: Former  Substance Use Topics  . Alcohol use: No    Alcohol/week: 0.0 standard drinks  . Drug use: No    Review of Systems Constitutional: No fever. Eyes: No visual changes. ENT: No sore throat. Cardiovascular: Denies chest pain. Respiratory: Denies shortness of breath. Gastrointestinal: No nausea, vomiting, diarrhea. Genitourinary:  Negative for dysuria. Musculoskeletal: Negative for back pain. Skin: + for rash. Neurological: Negative for focal weakness or numbness.  ____________________________________________   PHYSICAL EXAM:  VITAL SIGNS: ED Triage Vitals [04/22/20 2320]  Enc Vitals Group     BP 115/90     Pulse Rate 80     Resp 18     Temp 98.2 F (36.8 C)     Temp Source Oral     SpO2 94 %     Weight 215 lb (97.5 kg)     Height 5' 1"  (1.549 m)     Head Circumference      Peak Flow      Pain Score 0     Pain Loc      Pain Edu?      Excl. in [place]?    CONSTITUTIONAL: Alert and oriented and responds appropriately to questions. Well-appearing; well-nourished HEAD: Normocephalic EYES: Conjunctivae clear, pupils appear equal, EOM appear intact ENT: normal nose; moist mucous membranes NECK: Supple, normal ROM CARD: RRR; S1 and S2 appreciated; no murmurs, no clicks, no rubs, no gallops RESP: Normal chest excursion without splinting or tachypnea; breath sounds clear and equal bilaterally; no wheezes, no rhonchi, no rales, no hypoxia or respiratory distress, speaking full sentences ABD/GI: Normal bowel sounds; non-distended; soft, non-tender, no rebound, no guarding, no peritoneal signs, no hepatosplenomegaly BACK: The back appears normal EXT: Normal ROM in all joints; no deformity noted, no edema; no cyanosis SKIN: Normal color for age and race; warm; patient has erythematous warm raised areas to the right inguinal area and underneath the pannus with scattered excoriation and slightly foul smell without signs of drainage, abscess.  No subcutaneous emphysema. NEURO: Moves all extremities equally PSYCH: The patient's mood and manner are appropriate.  ____________________________________________   LABS (all labs ordered are listed, but only abnormal results are displayed)  Labs Reviewed  CBG MONITORING, ED - Abnormal; Notable for the following components:      Result Value   Glucose-Capillary 105 (*)     All other components within normal limits   ____________________________________________  EKG  None ____________________________________________  RADIOLOGY I, Brunetta Newingham, personally viewed and evaluated these images (plain radiographs) as part of my medical decision making, as well as reviewing the written report by the radiologist.  ED MD interpretation: None  Official radiology report(s): No results found.  ____________________________________________   PROCEDURES  Procedure(s) performed (including Critical Care):  Procedures    ____________________________________________   INITIAL IMPRESSION / ASSESSMENT AND PLAN / ED COURSE  As part of my medical decision making, I reviewed the following data within the [place] notes reviewed and incorporated, Old chart reviewed and Notes from prior ED visits         Patient here with acute yeast infection underneath her pannus and in the right inguinal area with possible superimposed bacterial infection.  She has areas of excoriation.  No abscess.  No subcutaneous emphysema.  No systemic symptoms.  Blood sugar normal.  Will give dose of Diflucan here and start her on nystatin with triamcinolone and discharged on Keflex for possible cellulitis/panniculitis.  She has a primary care provider for close follow-up.  Discussed return precautions.  At this time, I do not feel there is any life-threatening condition present. I have reviewed, interpreted and discussed all results (EKG, imaging, lab, urine as appropriate) and exam findings with patient/family. I have reviewed nursing notes and appropriate previous records.  I feel the patient is safe to be discharged home without further emergent workup and can continue workup as an outpatient as needed. Discussed usual and customary return precautions. Patient/family verbalize understanding and are comfortable with this plan.  Outpatient follow-up has been provided  as needed. All questions have been answered.  ____________________________________________   FINAL CLINICAL IMPRESSION(S) / ED DIAGNOSES  Final diagnoses:  Yeast infection of the skin  Panniculitis     ED Discharge Orders         Ordered    cephALEXin (KEFLEX) 500 MG capsule  4 times daily        04/23/20 0607    nystatin-triamcinolone ointment (MYCOLOG)  2 times daily        04/23/20 5615          *Please note:  Nicole Austin was evaluated in Emergency Department on 04/23/2020 for the symptoms described in the history of present illness. She was evaluated in the context of the global COVID-19 pandemic, which necessitated consideration that the patient might be at risk for infection with the SARS-CoV-2 virus that causes COVID-19. Institutional protocols and algorithms that pertain to the evaluation of patients at risk for COVID-19 are in a state of rapid change based on information released by regulatory bodies including the CDC and federal and state organizations. These policies and algorithms were followed during the patient's care in the ED.  Some ED evaluations and interventions may be delayed as a result of limited staffing during and the pandemic.*   Note:  This document was prepared using Dragon voice recognition software and may include unintentional dictation errors.   Haizley Cannella, Delice Bison, DO 04/23/20 0700

## 2020-04-29 ENCOUNTER — Ambulatory Visit: Payer: Medicare Other | Admitting: Family Medicine

## 2020-05-06 NOTE — Chronic Care Management (AMB) (Signed)
  Chronic Care Management   Outreach Note   Name: Ronetta Molla MRN: [id number] DOB: 1960-09-15  Primary Care Provider: Delsa Grana, PA-C Reason for referral : Chronic Care Management   An unsuccessful telephone outreach was attempted today. Ms. Gulledge was referred to the case management team for assistance with care management and care coordination.     Follow Up Plan:  A HIPAA compliant voice message was left today requesting a return call.    [facility] Care Management [facility] [phone]

## 2020-05-09 ENCOUNTER — Ambulatory Visit: Payer: Self-pay

## 2020-05-09 NOTE — Chronic Care Management (AMB) (Signed)
  Chronic Care Management   Outreach Note  05/09/2020 Name: Nicole Austin MRN: [id number] DOB: March 24, 1960  Primary Care Provider: Delsa Grana, PA-C Reason for referral : Chronic Care Management   Nicole Austin  was referred to the care management team for assistance with chronic care management and care coordination. Unfortunately multiple call attempts have been made and  she has not returned calls to establish nursing services. Per chart review, she has engaged with the team Pharmacist.  Will gladly engage for nursing outreach at any time in the future if she is interested in receiving assistance.   Follow Up Plan:  Will gladly engage for nursing outreach if Nicole Austin requires assistance and agrees to services.    [facility] Care [facility] [phone]

## 2020-05-19 ENCOUNTER — Ambulatory Visit: Payer: Medicare Other | Admitting: Family Medicine

## 2020-05-24 ENCOUNTER — Other Ambulatory Visit: Payer: Self-pay | Admitting: Family Medicine

## 2020-05-24 DIAGNOSIS — M797 Fibromyalgia: Secondary | ICD-10-CM

## 2020-05-24 DIAGNOSIS — G8929 Other chronic pain: Secondary | ICD-10-CM

## 2020-05-24 NOTE — Telephone Encounter (Signed)
Medication Refill - Medication:   tiZANidine (ZANAFLEX) 2 MG tablet    Has the patient contacted their pharmacy? Yes.  no refills and future appt scheduled on 4/5.  (Agent: If no, request that the patient contact the pharmacy for the refill.) (Agent: If yes, when and what did the pharmacy advise?)  Preferred Pharmacy (with phone number or street name):   [place], [place] - [address] [place] [address]  Phone:  [phone] Fax:  [phone]   Agent: Please be advised that RX refills may take up to 3 business days. We ask that you follow-up with your pharmacy.

## 2020-05-24 NOTE — Telephone Encounter (Signed)
Requested medication (s) are due for refill today: yes  Requested medication (s) are on the active medication list: yes  Last refill:  02/23/20  Future visit scheduled: yes  Notes to clinic:  med not delegated    Requested Prescriptions  Pending Prescriptions Disp Refills   tiZANidine (ZANAFLEX) 2 MG tablet 60 tablet 1      Not Delegated - Cardiovascular:  Alpha-2 Agonists - tizanidine Failed - 05/24/2020  1:26 PM      Failed - This refill cannot be delegated      Passed - Valid encounter within last 6 months    Recent Outpatient Visits           1 month ago Acute vaginitis   [facility] Delsa Grana, PA-C   3 months ago COPD exacerbation [facility])   [place], MD   5 months ago Hyperlipidemia, unspecified hyperlipidemia type   [facility] Delsa Grana, PA-C   9 months ago Adult general medical exam   [facility] Delsa Grana, PA-C   12 months ago Encounter for examination following treatment at hospital   [facility] Towanda Malkin, MD       Future Appointments             In 1 week Delsa Grana, PA-C [facility], [place]   In 10 months  [facility], [facility]

## 2020-05-25 ENCOUNTER — Ambulatory Visit: Payer: Self-pay | Admitting: *Deleted

## 2020-05-25 NOTE — Telephone Encounter (Signed)
I returned pt's call.  She is c/o severe pain in her entire left groin area and under her stomach fold (pannus).  The left groin started burning 2 days ago.  "It's cellulitis"    "It's burning, very red and draining yellow substance".   She is requesting something for the pain until she can be seen in the office next Tuesday with Delsa Grana, PA-C.   She uses public transportation and has to make arrangements 5 days in advance so she can't come in today.  She was seen in the ED on 04/23/2020.  They told her she had a yeast infection in her right groin area and under her stomach.  She may possibly have a bacterial infection too.   They gave her medication for it.  The right groin area is a little better but not much and now it has spread to the left groin area.    She is requesting something for the severe burning pain.   "I hurt so bad I can't hardly do anything including eat because I hurt so bad".    "I can't even stand up straight due to the pain in my groin and under my stomach".  Protocol is to be seen within 4 hours however since she doesn't have transportation and doesn't want to return to the ED I have sent a high priority note to [facility] for Delsa Grana, PA-C high priority.  Pt can be reached at [phone].  Reason for Disposition . [1] Looks infected (spreading redness, pus) AND [2] large red area (> 2 in. or 5 cm)    Left groin and under her stomach (pannus).  Answer Assessment - Initial Assessment Questions 1. APPEARANCE of RASH: "Describe the rash."      Has a very sore rash in left groin that started day before yesterday.   It is extremely painful.   I went to the hospital on 04/23/2020 for same thing but it was on my right groin and under my stomach.   It was a yeast infection.  They gave me medication for it but it hasn't helped.   The right groin area is a little better but still not cleared up. 2. LOCATION: "Where is the rash located?"      Left groin area now.    I think it's the cellulitis.   It's not a yeast infection that is going on in my left groin because it's more like the cellulitis.    I have a headache.  My stomach is upset due to the pain.  I can't stand up straight.  3. NUMBER: "How many spots are there?"      My whole left groin area and under my stomach.  "It's not between my legs thank goodness!" 4. SIZE: "How big are the spots?" (Inches, centimeters or compare to size of a coin)      Entire left groin area 5. ONSET: "When did the rash start?"      Day before yesterday the left groin area started burning and is very red and draining a yellow substance. 6. ITCHING: "Does the rash itch?" If Yes, ask: "How bad is the itch?"  (Scale 1-10; or mild, moderate, severe)     No but it burns bad. 7. PAIN: "Does the rash hurt?" If Yes, ask: "How bad is the pain?"  (Scale 1-10; or mild, moderate, severe)     Severe   8. OTHER SYMPTOMS: "Do you have any other symptoms?" (e.g., fever)  Denies fever, chills or body aches but is hurting so bad she is having difficulty doing her daily activities.   "I have a very poor appetite". 9. PREGNANCY: "Is there any chance you are pregnant?" "When was your last menstrual period?"     N/A due to age  Protocols used: [place]

## 2020-05-25 NOTE — Telephone Encounter (Signed)
Called pt told her she could not be treated without appt.  Pt was told to go to urgent care or ER
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
## 2020-05-31 ENCOUNTER — Ambulatory Visit: Payer: Medicare Other | Admitting: Family Medicine

## 2020-05-31 DIAGNOSIS — E786 Lipoprotein deficiency: Secondary | ICD-10-CM

## 2020-05-31 DIAGNOSIS — F319 Bipolar disorder, unspecified: Secondary | ICD-10-CM

## 2020-05-31 DIAGNOSIS — J449 Chronic obstructive pulmonary disease, unspecified: Secondary | ICD-10-CM

## 2020-06-17 ENCOUNTER — Telehealth: Payer: Self-pay

## 2020-06-17 NOTE — Telephone Encounter (Signed)
Pt has to give transprtation a 5 day notice. Please advise as to where to schedule Copied from [place] [phone]. Topic: General - Other >> Jun 17, 2020 12:17 PM Mcneil, Ja-Kwan wrote: Reason for CRM: Pt stated her Rollator walker is broken and she would like to get a Rx for a new one. Pt requests call back

## 2020-06-20 NOTE — Telephone Encounter (Signed)
Per ins. She will have to have an appt.  And this will need to be documented in an office visit for ins to cover

## 2020-06-20 NOTE — Telephone Encounter (Signed)
Please schedule whenever she can come per 5 day notice

## 2020-06-22 ENCOUNTER — Telehealth: Payer: Self-pay

## 2020-06-22 NOTE — Progress Notes (Signed)
Chronic Care Management Pharmacy Assistant   Name: Nicole Austin  MRN: [id number] DOB: 14-Aug-1960  Reason for Encounter:COPD Disease State   Recent office visits:  05/09/2020 Nicole Austin  Recent consult visits:  No recent [facility] [facility] visits:  Medication Reconciliation was completed by comparing discharge summary, patient's EMR and Pharmacy list, and upon discussion with patient.  Admitted to the hospital on 04/23/2020 due to Yeast Infection of the skin . Discharge date was 04/23/2020. Discharged from [place]?Medications Started at [facility] Discharge:?? -started Cephalexin 500 mg 4 times daily,Started Nystatin-Triamcinolone Topical 2 times daily   Medication Changes at Hospital Discharge: -Changed None  Medications Discontinued at Hospital Discharge: -Stopped None   Medications that remain the same after Hospital Discharge:??  -All other medications will remain the same.    Medications: Outpatient Encounter Medications as of 06/22/2020  Medication Sig Note  . albuterol (VENTOLIN HFA) 108 (90 Base) MCG/ACT inhaler Inhale 2 puffs into the lungs every 4 (four) hours as needed for wheezing or shortness of breath.   . ALPRAZolam (XANAX) 0.5 MG tablet Take 0.5 mg by mouth 4 (four) times daily.   Marland Kitchen amantadine (SYMMETREL) 100 MG capsule Take 100 mg by mouth 2 (two) times daily.   Marland Kitchen atorvastatin (LIPITOR) 40 MG tablet Take 1 tablet (40 mg total) by mouth at bedtime.   . divalproex (DEPAKOTE) 250 MG DR tablet Take 1 tablet (250 mg total) by mouth 2 (two) times daily.   Marland Kitchen escitalopram (LEXAPRO) 20 MG tablet Take 1 tablet (20 mg total) by mouth daily.   Marland Kitchen ipratropium-albuterol (DUONEB) 0.5-2.5 (3) MG/3ML SOLN Take 3 mLs by nebulization 3 (three) times daily as needed.   Marland Kitchen LATUDA 60 MG TABS Take 60 mg by mouth at bedtime.    . Multiple Vitamin (MULTIVITAMIN WITH MINERALS) TABS tablet Take 1 tablet by mouth daily. 04/20/2020: Centrum  Silver 50+ Womens   . nystatin-triamcinolone ointment (MYCOLOG) Apply 1 application topically 2 (two) times daily.   . promethazine (PHENERGAN) 25 MG tablet Take 25 mg by mouth 2 (two) times daily as needed for nausea or vomiting.   Marland Kitchen Respiratory Therapy Supplies (NEBULIZER/TUBING/MOUTHPIECE) KIT Disp one nebulizer machine, tubing set and mouthpiece kit (Patient not taking: No sig reported)   . rOPINIRole (REQUIP XL) 4 MG 24 hr tablet Take 4 mg by mouth daily.   . Tiotropium Bromide Monohydrate (SPIRIVA RESPIMAT) 2.5 MCG/ACT AERS INHHALE TWO PUFFS INTO LUNGS DAILY   . tiZANidine (ZANAFLEX) 2 MG tablet TAKE 1 TO 2 TABLETS BY MOUTH 3 TIMES DAILY AS NEEDED FOR MUSLCE SPASMS    No facility-administered encounter medications on file as of 06/22/2020.   Star Rating Drugs: Atorvastatin 40 mg last filled on 06/17/2020 for 30 day supply at [facility].  . Current COPD regimen:   Ventolin HFA 108 mcg/act 2 puff every 4 hours as needed   Duoneb 68m nebulizer three times daily as needed   Spiriva 2.5 mcg/act 2 puffs daily  . No flowsheet data found. . Any recent hospitalizations or ED visits since last visit with CPP? Yes . Reports COPD symptoms, including Increased shortness of breath , Shortness of breath at rest and Wheezing . What recent interventions/DTPs have been made by any provider to improve breathing since last visit:None ID . Have you had exacerbation/flare-up since last visit? No . What do you do when you are short of breath?  Rescue medication  Respiratory Devices/Equipment . Do you have  a nebulizer? Yes . Do you use a Peak Flow Meter? No . Do you use a maintenance inhaler? Yes . How often do you forget to use your daily inhaler?  o Patient states she forgets to use her inhaler two to three times a week because she was busy taking care of her sister . Marland Kitchen Do you use a rescue inhaler? Yes . How often do you use your rescue inhaler?  prn . Do you use a spacer with your  inhaler? No  Adherence Review: . Does the patient have >5 day gap between last estimated fill date for maintenance inhaler medications? Yes   [place] Pharmacist Assistant [phone]

## 2020-06-29 ENCOUNTER — Ambulatory Visit: Payer: Medicare (Managed Care) | Admitting: Family Medicine

## 2020-07-06 ENCOUNTER — Ambulatory Visit (INDEPENDENT_AMBULATORY_CARE_PROVIDER_SITE_OTHER): Payer: Medicare Other | Admitting: Family Medicine

## 2020-07-06 ENCOUNTER — Other Ambulatory Visit: Payer: Self-pay

## 2020-07-06 ENCOUNTER — Encounter: Payer: Self-pay | Admitting: Family Medicine

## 2020-07-06 VITALS — BP 126/74 | HR 92 | Temp 98.2°F | Resp 16 | Ht 61.0 in | Wt 217.2 lb

## 2020-07-06 DIAGNOSIS — Z7409 Other reduced mobility: Secondary | ICD-10-CM

## 2020-07-06 DIAGNOSIS — G8929 Other chronic pain: Secondary | ICD-10-CM

## 2020-07-06 DIAGNOSIS — J449 Chronic obstructive pulmonary disease, unspecified: Secondary | ICD-10-CM | POA: Diagnosis not present

## 2020-07-06 DIAGNOSIS — M549 Dorsalgia, unspecified: Secondary | ICD-10-CM

## 2020-07-06 DIAGNOSIS — E786 Lipoprotein deficiency: Secondary | ICD-10-CM

## 2020-07-06 DIAGNOSIS — M797 Fibromyalgia: Secondary | ICD-10-CM

## 2020-07-06 DIAGNOSIS — E785 Hyperlipidemia, unspecified: Secondary | ICD-10-CM

## 2020-07-06 DIAGNOSIS — Z1211 Encounter for screening for malignant neoplasm of colon: Secondary | ICD-10-CM

## 2020-07-06 DIAGNOSIS — Z789 Other specified health status: Secondary | ICD-10-CM

## 2020-07-06 DIAGNOSIS — F319 Bipolar disorder, unspecified: Secondary | ICD-10-CM

## 2020-07-06 MED ORDER — ATORVASTATIN CALCIUM 40 MG PO TABS: 40.0000 mg | ORAL_TABLET | Freq: Every day | ORAL | 3 refills | Status: DC

## 2020-07-06 MED ORDER — TIZANIDINE HCL 2 MG PO TABS
ORAL_TABLET | ORAL | 1 refills | Status: DC
Start: 2020-07-06 — End: 2020-10-17

## 2020-07-06 NOTE — Patient Instructions (Signed)
Allene Dillon, NP (Attending) [phone] (Work) [phone] (Fax) [address] [place], [place] [address] Physical Medicine and Rehabilitation

## 2020-07-06 NOTE — Progress Notes (Signed)
Name: Nicole Austin   MRN: [id number]    DOB: 1961/02/12   Date:07/06/2020       Progress Note  Chief Complaint  Patient presents with  . Hyperlipidemia  . Medication Refill  . RX    Rollator   . paperwork    For nurse aid       Subjective:   Nicole Austin is a 60 y.o. female, presents to clinic for routine f/up and needs paperwork for home aid and new rolling walker  She has chronic pain, seziure disorder -managed with muscle relaxers, Requip, Mobic, Depakote, Xanax, Latuda she does see neurology and psychiatry specialist   Near fall recently -desire to have a rolling walker with a seat, hers is very old, she uses walker with longer distances -  Fatigue with ambulating long distances she needs to rest due to fatigue, weakness, chronic pain and dyspnea on exertion  Hyperlipidemia on Lipitor 40 mg  History of ADHD, anxiety, fibromyalgia, managed on Lexapro 20 mg   Current Outpatient Medications:  .  albuterol (VENTOLIN HFA) 108 (90 Base) MCG/ACT inhaler, Inhale 2 puffs into the lungs every 4 (four) hours as needed for wheezing or shortness of breath., Disp: 18 g, Rfl: 2 .  ALPRAZolam (XANAX) 0.5 MG tablet, Take 0.5 mg by mouth 4 (four) times daily., Disp: , Rfl:  .  amantadine (SYMMETREL) 100 MG capsule, Take 100 mg by mouth 2 (two) times daily., Disp: , Rfl:  .  atorvastatin (LIPITOR) 40 MG tablet, Take 1 tablet (40 mg total) by mouth at bedtime., Disp: 90 tablet, Rfl: 3 .  divalproex (DEPAKOTE) 250 MG DR tablet, Take 1 tablet (250 mg total) by mouth 2 (two) times daily., Disp: 60 tablet, Rfl: 2 .  escitalopram (LEXAPRO) 20 MG tablet, Take 1 tablet (20 mg total) by mouth daily., Disp: 30 tablet, Rfl: 2 .  ipratropium-albuterol (DUONEB) 0.5-2.5 (3) MG/3ML SOLN, Take 3 mLs by nebulization 3 (three) times daily as needed., Disp: 180 mL, Rfl: 1 .  LATUDA 60 MG TABS, Take 60 mg by mouth at bedtime. , Disp: , Rfl:  .  meloxicam (MOBIC) 15 MG tablet, Take 1 tablet by  mouth daily., Disp: , Rfl:  .  methocarbamol (ROBAXIN) 500 MG tablet, Take 1 tablet by mouth 3 (three) times daily., Disp: , Rfl:  .  Multiple Vitamin (MULTIVITAMIN WITH MINERALS) TABS tablet, Take 1 tablet by mouth daily., Disp: , Rfl:  .  nystatin-triamcinolone ointment (MYCOLOG), Apply 1 application topically 2 (two) times daily., Disp: 60 g, Rfl: 1 .  promethazine (PHENERGAN) 25 MG tablet, Take 25 mg by mouth 2 (two) times daily as needed for nausea or vomiting., Disp: , Rfl:  .  Respiratory Therapy Supplies (NEBULIZER/TUBING/MOUTHPIECE) KIT, Disp one nebulizer machine, tubing set and mouthpiece kit, Disp: 1 kit, Rfl: 0 .  rOPINIRole (REQUIP XL) 4 MG 24 hr tablet, Take 4 mg by mouth daily., Disp: , Rfl:  .  Tiotropium Bromide Monohydrate (SPIRIVA RESPIMAT) 2.5 MCG/ACT AERS, INHHALE TWO PUFFS INTO LUNGS DAILY, Disp: 4 g, Rfl: 2 .  tiZANidine (ZANAFLEX) 2 MG tablet, TAKE 1 TO 2 TABLETS BY MOUTH 3 TIMES DAILY AS NEEDED FOR MUSLCE SPASMS, Disp: 60 tablet, Rfl: 1 .  carbidopa-levodopa (SINEMET IR) 25-100 MG tablet, Take by mouth., Disp: , Rfl:   Patient Active Problem List   Diagnosis Date Noted  . Benign neoplasm of ascending colon   . Polyp of sigmoid colon   . Bipolar disorder with depression ([place])  02/25/2017  . ADD (attention deficit disorder) 02/25/2017  . Marijuana use 02/13/2017  . Neuropathy, peripheral 05/07/2016  . Vitamin B12 deficiency 10/24/2015  . Fibromyalgia 08/30/2015  . Low HDL (under 40) 08/30/2015  . GERD (gastroesophageal reflux disease) 08/30/2015  . OP (osteoporosis) 06/30/2015  . Sleep apnea 06/30/2015  . Morbid (severe) obesity due to excess calories ([place]) 08/16/2014  . Low back pain with sciatica 08/04/2014  . Current tobacco use 08/04/2014  . Polysubstance abuse ([place]) 07/04/2014  . Psychoactive substance abuse ([place]) 07/04/2014  . Anxiety, generalized 11/24/2013  . COPD with asthma ([place]) 11/18/2013  . Arthritis of knee, degenerative 07/15/2013  .  Osteoarthritis of knee, unspecified 07/15/2013    Past Surgical History:  Procedure Laterality Date  . BACK SURGERY    . CATARACT EXTRACTION W/PHACO Right 01/01/2019   Procedure: CATARACT EXTRACTION PHACO AND INTRAOCULAR LENS PLACEMENT (IOC) right vision blue;  Surgeon: Marchia Meiers, MD;  Location: [facility] ORS;  Service: Ophthalmology;  Laterality: Right;  [place] 00:39.4 CDE 5.49 Fluid Pack lot # [id number]  . CATARACT EXTRACTION W/PHACO Left 01/29/2019   Procedure: CATARACT EXTRACTION PHACO AND INTRAOCULAR LENS PLACEMENT ([place]) LEFT Vision Blue;  Surgeon: Marchia Meiers, MD;  Location: [facility] ORS;  Service: Ophthalmology;  Laterality: Left;  [place] 00:51.1 CDE 4.38 Fluid Pack Lot # [id number]  . COLONOSCOPY WITH PROPOFOL N/A 08/07/2017   Procedure: COLONOSCOPY WITH PROPOFOL;  Surgeon: Virgel Manifold, MD;  Location: [facility] ENDOSCOPY;  Service: Endoscopy;  Laterality: N/A;  . EYE SURGERY    . JOINT REPLACEMENT Left 2016   tkr  . REPLACEMENT TOTAL KNEE Left 2016    Family History  Problem Relation Age of Onset  . Hernia Mother   . Heart disease Mother   . OCD Mother   . Diabetes Mother   . Parkinson's disease Father   . Bipolar disorder Sister   . Schizophrenia Sister   . ADD / ADHD Sister   . Alcohol abuse Brother   . Bipolar disorder Sister   . Paranoid behavior Sister   . ADD / ADHD Sister   . ADD / ADHD Son   . Dementia Maternal Grandmother   . Emphysema Maternal Grandfather   . ADD / ADHD Son   . ADD / ADHD Son   . Depression Son     Social History   Tobacco Use  . Smoking status: Current Every Day Smoker    Packs/day: 1.00    Years: 36.00    Pack years: 36.00    Types: Cigarettes  . Smokeless tobacco: Never Used  Vaping Use  . Vaping Use: Former  Substance Use Topics  . Alcohol use: No    Alcohol/week: 0.0 standard drinks  . Drug use: No     Allergies  Allergen Reactions  . Chantix [Varenicline] Nausea Only  . Codeine Nausea And Vomiting    Health Maintenance   Topic Date Due  . MAMMOGRAM  Never done  . COLONOSCOPY (Pts 45-75yr Insurance coverage will need to be confirmed)  08/08/2018  . INFLUENZA VACCINE  09/26/2020  . PAP SMEAR-Modifier  05/01/2021  . TETANUS/TDAP  12/12/2025  . COVID-19 Vaccine  Completed  . Hepatitis C Screening  Completed  . HIV Screening  Completed  . HPV VACCINES  Aged Out    Chart Review Today: I personally reviewed active problem list, medication list, allergies, family history, social history, health maintenance, notes from last encounter, lab results, imaging with the patient/caregiver today.   Review of Systems  Constitutional:  Negative.   HENT: Negative.   Eyes: Negative.   Respiratory: Negative.   Cardiovascular: Negative.   Gastrointestinal: Negative.   Endocrine: Negative.   Genitourinary: Negative.   Musculoskeletal: Negative.   Skin: Negative.   Allergic/Immunologic: Negative.   Neurological: Negative.   Hematological: Negative.   Psychiatric/Behavioral: Negative.   All other systems reviewed and are negative.    Objective:   Vitals:   07/06/20 1459  BP: 126/74  Pulse: 92  Resp: 16  Temp: 98.2 F (36.8 C)  SpO2: 95%  Weight: 217 lb 3.2 oz (98.5 kg)  Height: _0  (1.549 m)    Body mass index is 41.04 kg/m.  Physical Exam Vitals and nursing note reviewed.  Constitutional:      General: She is not in acute distress.    Appearance: She is well-developed and well-groomed. She is morbidly obese. She is not toxic-appearing or diaphoretic. Ill appearance: chronically ill appearing.     Interventions: Face mask in place.  Cardiovascular:     Rate and Rhythm: Normal rate and regular rhythm.     Pulses: Normal pulses.     Heart sounds: Normal heart sounds.  Pulmonary:     Effort: Pulmonary effort is normal. No respiratory distress.     Breath sounds: No stridor. Wheezing present. No rhonchi.  Abdominal:     General: Bowel sounds are normal. There is no distension.     Palpations:  Abdomen is soft.  Skin:    Coloration: Skin is not jaundiced or pale.     Findings: No lesion or rash.  Neurological:     Mental Status: She is alert.     Sensory: No sensory deficit.     Motor: Weakness present.     Coordination: Coordination normal.     Gait: Gait abnormal.  Psychiatric:        Mood and Affect: Mood normal.        Behavior: Behavior normal. Behavior is cooperative.         Assessment & Plan:     ICD-10-CM   1. Hyperlipidemia, unspecified hyperlipidemia type  E78.5   2. Low HDL (under 40)  E78.6 atorvastatin (LIPITOR) 40 MG tablet  3. COPD with asthma ([place])  J44.9 Ambulatory referral to Home Health  4. Chronic back pain, unspecified back location, unspecified back pain laterality  M54.9 tiZANidine (ZANAFLEX) 2 MG tablet   G89.29 For home use only DME 4 wheeled rolling walker with seat ([id number])    Ambulatory referral to [place] mobility and duration of ambulation  5. Chronic back pain, unspecified back location, unspecified back pain laterality  M54.9 tiZANidine (ZANAFLEX) 2 MG tablet   G89.29 For home use only DME 4 wheeled rolling walker with seat ([id number])    Ambulatory referral to [place]   zanaflex, avoid prolonged periods of immobility - may need physiatry? PT? or f/up with spinal specialist  6. Fibromyalgia  M79.7 tiZANidine (ZANAFLEX) 2 MG tablet    For home use only DME 4 wheeled rolling walker with seat ([id number])    Ambulatory referral to [place]   pt encouraged to come in for management of this or we can refer to specialist, refill on zanaflex - caution with other sedating meds  7. Screening for malignant neoplasm of colon  Z12.11 Fecal Globin By Immunochemistry  8. Bipolar disorder with depression ([place]) Chronic F31.9    Managed with Latuda, per specialist  9. Morbid obesity ([place]) Chronic E66.01 Ambulatory referral to Home  Health   Morbid obesity with multiple comorbidities including COPD, hyperlipidemia, impaired  mobility, chronic pain  10. Impaired mobility and ADLs  Z74.09 Ambulatory referral to [place]   Z78.9      Return in about 6 months (around 01/06/2021).   Delsa Grana, PA-C 07/06/20 3:30 PM

## 2020-07-15 ENCOUNTER — Encounter: Payer: Self-pay | Admitting: Family Medicine

## 2020-08-03 ENCOUNTER — Telehealth: Payer: Self-pay

## 2020-08-03 NOTE — Progress Notes (Signed)
Chronic Care Management Pharmacy Assistant   Name: Nicole Austin  MRN: [id number] DOB: 1960/10/14  Reason for Encounter:Osteoporosis and COPD Disease State Call.   Recent office visits:  06/26/2020 Delsa Grana PA-C  (PCP)   Recent consult visits:  None ID  Hospital visits:  None in previous 6 months  Medications: Outpatient Encounter Medications as of 08/03/2020  Medication Sig Note  . albuterol (VENTOLIN HFA) 108 (90 Base) MCG/ACT inhaler Inhale 2 puffs into the lungs every 4 (four) hours as needed for wheezing or shortness of breath.   . ALPRAZolam (XANAX) 0.5 MG tablet Take 0.5 mg by mouth 4 (four) times daily.   Marland Kitchen amantadine (SYMMETREL) 100 MG capsule Take 100 mg by mouth 2 (two) times daily.   Marland Kitchen atorvastatin (LIPITOR) 40 MG tablet Take 1 tablet (40 mg total) by mouth at bedtime.   . carbidopa-levodopa (SINEMET IR) 25-100 MG tablet Take by mouth.   . divalproex (DEPAKOTE) 250 MG DR tablet Take 1 tablet (250 mg total) by mouth 2 (two) times daily.   Marland Kitchen escitalopram (LEXAPRO) 20 MG tablet Take 1 tablet (20 mg total) by mouth daily.   Marland Kitchen ipratropium-albuterol (DUONEB) 0.5-2.5 (3) MG/3ML SOLN Take 3 mLs by nebulization 3 (three) times daily as needed.   Marland Kitchen LATUDA 60 MG TABS Take 60 mg by mouth at bedtime.    . meloxicam (MOBIC) 15 MG tablet Take 1 tablet by mouth daily.   . methocarbamol (ROBAXIN) 500 MG tablet Take 1 tablet by mouth 3 (three) times daily.   . Multiple Vitamin (MULTIVITAMIN WITH MINERALS) TABS tablet Take 1 tablet by mouth daily. 04/20/2020: Centrum Silver 50+ Womens   . nystatin-triamcinolone ointment (MYCOLOG) Apply 1 application topically 2 (two) times daily.   . promethazine (PHENERGAN) 25 MG tablet Take 25 mg by mouth 2 (two) times daily as needed for nausea or vomiting.   Marland Kitchen Respiratory Therapy Supplies (NEBULIZER/TUBING/MOUTHPIECE) KIT Disp one nebulizer machine, tubing set and mouthpiece kit   . rOPINIRole (REQUIP XL) 4 MG 24 hr tablet Take 4 mg by mouth  daily.   . Tiotropium Bromide Monohydrate (SPIRIVA RESPIMAT) 2.5 MCG/ACT AERS INHHALE TWO PUFFS INTO LUNGS DAILY   . tiZANidine (ZANAFLEX) 2 MG tablet TAKE 1 TO 2 TABLETS BY MOUTH 3 TIMES DAILY AS NEEDED FOR MUSLCE SPASMS    No facility-administered encounter medications on file as of 08/03/2020.   Star Rating Drugs: Atorvastatin 40 mg last filled on 07/19/2020 for 30 day supply at [facility]  . Current COPD regimen:   Ventolin HFA 108 mcg/act 2 puff every 4 hours as needed   Duoneb 26m nebulizer three times daily as needed   Spiriva 2.5 mcg/act 2 puffs daily  . No flowsheet data found. . Any recent hospitalizations or ED visits since last visit with CPP? No . Reports COPD symptoms, including Increased shortness of breath , Shortness of breath at rest and Wheezing . What recent interventions/DTPs have been made by any provider to improve breathing since last visit:None ID . Have you had exacerbation/flare-up since last visit? No . What do you do when you are short of breath?  Rescue medication  Respiratory Devices/Equipment . Do you have a nebulizer? Yes . Do you use a Peak Flow Meter? No . Do you use a maintenance inhaler? Yes . How often do you forget to use your daily inhaler?  ? Patient states she forgets to use her inhaler two to three times a week because she was busy taking care  of her sister  . Do you use a rescue inhaler? Yes . How often do you use your rescue inhaler?  prn . Do you use a spacer with your inhaler? No  Adherence Review: . Does the patient have >5 day gap between last estimated fill date for maintenance inhaler medications? Yes   Osteoporosis   Has the patient had any falls recently?  Patient states she a couple of falls a few time a months landing on her knees and hip.Patient denies hitting her head.Patient states she is very off balance.  How many servings of dietary calcium (Milk, cheese, yogurt) do they get daily?  Patient reports she  lives by herself, and she  does not get a lot of calcium in her diet.Patient states she eat sandwiches and soup most of the time.Patient states when she does find a way to the store she will purchase fruits and vegetables.   Are they taking any OTC vitamin D or calcium supplements?Per Clinical Pharmacist patient should be getting at least 2000 units of vitamin D daily.   Patient states she takes a Multivitamin daily that contains 1000 units of Vitamin D.I informed patient clinical pharmacist recommendation of taking at least Vitamin D 2000 units daily.Patient verbalized understanding and states she will try to purchase Vitamin D supplement when she goes back to the store.    Per clinical Pharmacist: Check imaging tab, is there a new DEXA that was done? If not, is patient willing to reschedule their active DEXA scan? patient will need to call and schedule [phone]).  Patient states she has not been able to reschedule her appointment yet because she has been taking care of her sister who has been diagnose with COVID.Patient reports she has not been feeling well and decide to quarantine herself just in case she had COVID. I informed patient if  she was willing to call this number [phone] and schedule her DEXA scan when she is feeling better.Patient verbalized understanding and agrees to call [phone] to schedule her DEXA scan when she is feeling better.   [place] Pharmacist Assistant [phone]

## 2020-08-18 LAB — HEMOGLOBIN A1C: Hemoglobin A1C: 4

## 2020-08-19 ENCOUNTER — Other Ambulatory Visit: Payer: Self-pay | Admitting: Family Medicine

## 2020-09-07 ENCOUNTER — Encounter: Payer: Self-pay | Admitting: Family Medicine

## 2020-10-17 ENCOUNTER — Other Ambulatory Visit: Payer: Self-pay | Admitting: Family Medicine

## 2020-10-17 DIAGNOSIS — G8929 Other chronic pain: Secondary | ICD-10-CM

## 2020-10-17 DIAGNOSIS — M549 Dorsalgia, unspecified: Secondary | ICD-10-CM

## 2020-10-17 DIAGNOSIS — M797 Fibromyalgia: Secondary | ICD-10-CM

## 2020-10-18 ENCOUNTER — Telehealth: Payer: Self-pay

## 2020-10-18 NOTE — Progress Notes (Signed)
No mailbox to leave voice message to confirmed patient telephone appointment on 10/19/2020 for CCM at 1:00 pm with Junius Argyle the Clinical pharmacist.   [place] Pharmacist Assistant [phone]

## 2020-10-19 ENCOUNTER — Telehealth: Payer: Self-pay

## 2020-10-24 ENCOUNTER — Ambulatory Visit: Payer: Medicare Other | Admitting: Family Medicine

## 2020-11-07 ENCOUNTER — Ambulatory Visit: Payer: Medicare Other | Admitting: Family Medicine

## 2020-12-09 ENCOUNTER — Other Ambulatory Visit: Payer: Self-pay | Admitting: Family Medicine

## 2020-12-09 DIAGNOSIS — M797 Fibromyalgia: Secondary | ICD-10-CM

## 2020-12-09 DIAGNOSIS — M549 Dorsalgia, unspecified: Secondary | ICD-10-CM

## 2020-12-09 DIAGNOSIS — G8929 Other chronic pain: Secondary | ICD-10-CM

## 2020-12-09 NOTE — Telephone Encounter (Signed)
Requested medication (s) are due for refill today: yes  Requested medication (s) are on the active medication list: yes  Last refill:  10/17/20 #180 with 1 refill  Future visit scheduled: yes  Notes to clinic:  Please review for refill. Refill not delegated per protocol    Requested Prescriptions  Pending Prescriptions Disp Refills   tiZANidine (ZANAFLEX) 2 MG tablet [Pharmacy Med Name: TIZANIDINE HCL 2 MG TAB] 180 tablet 1    Sig: TAKE 1 TO 2 TABLETS BY MOUTH 3 TIMES DAILY AS NEEDED FOR MUSLCE SPASMS     Not Delegated - Cardiovascular:  Alpha-2 Agonists - tizanidine Failed - 12/09/2020  1:14 PM      Failed - This refill cannot be delegated      Passed - Valid encounter within last 6 months    Recent Outpatient Visits           5 months ago Hyperlipidemia, unspecified hyperlipidemia type   [facility] Delsa Grana, PA-C   8 months ago Acute vaginitis   [facility] [place], [name], PA-C   10 months ago COPD exacerbation [facility])   [facility] Tow[facility] Malkin, MD   1 year ago Hyperlipidemia, unspecified hyperlipidemia type   [facility] Delsa Grana, PA-C   1 year ago Adult general medical exam   [facility] Delsa Grana, PA-C       Future Appointments             In 3 months [facility], [facility]

## 2020-12-12 ENCOUNTER — Ambulatory Visit: Payer: Self-pay

## 2020-12-12 NOTE — Telephone Encounter (Signed)
Called pt to ask how she was doing, if sx worsen suggested to go to ER otherwise we could see the options here at the clinic to get seen before her appointment 10/25 with a different provider. Pt stated due to her transportation issues she cannot get seen earlier.

## 2020-12-12 NOTE — Telephone Encounter (Signed)
Pt. States she had been outside helping her sister in the yard Saturday, and started having neck pain and numbness and tingling down her right arm. Comes and goes. Aleve and Tylenol not helping. Has appointment 12/20/20. States she needs time to arrange transportation. Instructed to go to ED for worsening of symptoms. Verbalizes understanding.   Answer Assessment - Initial Assessment Questions 1. ONSET: "When did the pain begin?"      Saturday 2. LOCATION: "Where does it hurt?"      Neck 3. PATTERN "Does the pain come and go, or has it been constant since it started?"      Comes and goes 4. SEVERITY: "How bad is the pain?"  (Scale 1-10; or mild, moderate, severe)   - NO PAIN (0): no pain or only slight stiffness    - MILD (1-3): doesn't interfere with normal activities    - MODERATE (4-7): interferes with normal activities or awakens from sleep    - SEVERE (8-10):  excruciating pain, unable to do any normal activities      8 5. RADIATION: "Does the pain go anywhere else, shoot into your arms?"     Down right arm and hand 6. CORD SYMPTOMS: "Any weakness or numbness of the arms or legs?"     Mild weakness 7. CAUSE: "What do you think is causing the neck pain?"     Working outside 8. NECK OVERUSE: "Any recent activities that involved turning or twisting the neck?"     Yes 9. OTHER SYMPTOMS: "Do you have any other symptoms?" (e.g., headache, fever, chest pain, difficulty breathing, neck swelling)     Numbness and tingling 10. PREGNANCY: "Is there any chance you are pregnant?" "When was your last menstrual period?"       No  Protocols used: Neck Pain or Stiffness-A-AH

## 2020-12-14 ENCOUNTER — Telehealth: Payer: Self-pay

## 2020-12-14 NOTE — Progress Notes (Signed)
Chronic Care Management Pharmacy Assistant   Name: Dustee Bottenfield  MRN: [id number] DOB: 1960/11/19  Reason for Encounter:COPD Disease State Call.   Recent office visits:  No recent Office Visit  Recent consult visits:  No recent [facility] visits:  None in previous 6 months  Medications: Outpatient Encounter Medications as of 12/14/2020  Medication Sig Note   albuterol (VENTOLIN HFA) 108 (90 Base) MCG/ACT inhaler Inhale 2 puffs into the lungs every 4 (four) hours as needed for wheezing or shortness of breath.    ALPRAZolam (XANAX) 0.5 MG tablet Take 0.5 mg by mouth 4 (four) times daily.    amantadine (SYMMETREL) 100 MG capsule Take 100 mg by mouth 2 (two) times daily.    atorvastatin (LIPITOR) 40 MG tablet Take 1 tablet (40 mg total) by mouth at bedtime.    carbidopa-levodopa (SINEMET IR) 25-100 MG tablet Take by mouth.    divalproex (DEPAKOTE) 250 MG DR tablet Take 1 tablet (250 mg total) by mouth 2 (two) times daily.    escitalopram (LEXAPRO) 20 MG tablet Take 1 tablet (20 mg total) by mouth daily.    ipratropium-albuterol (DUONEB) 0.5-2.5 (3) MG/3ML SOLN Take 3 mLs by nebulization 3 (three) times daily as needed.    LATUDA 60 MG TABS Take 60 mg by mouth at bedtime.     meloxicam (MOBIC) 15 MG tablet Take 1 tablet by mouth daily.    methocarbamol (ROBAXIN) 500 MG tablet Take 1 tablet by mouth 3 (three) times daily.    Multiple Vitamin (MULTIVITAMIN WITH MINERALS) TABS tablet Take 1 tablet by mouth daily. 04/20/2020: Centrum Silver 50+ Womens    nystatin ointment (MYCOSTATIN) APPLY ONE APPLICATION AS INTRUCTED TWO TIMES DAILY.    nystatin-triamcinolone ointment (MYCOLOG) Apply 1 application topically 2 (two) times daily.    promethazine (PHENERGAN) 25 MG tablet Take 25 mg by mouth 2 (two) times daily as needed for nausea or vomiting.    Respiratory Therapy Supplies (NEBULIZER/TUBING/MOUTHPIECE) KIT Disp one nebulizer machine, tubing set and mouthpiece kit     rOPINIRole (REQUIP XL) 4 MG 24 hr tablet Take 4 mg by mouth daily.    Tiotropium Bromide Monohydrate (SPIRIVA RESPIMAT) 2.5 MCG/ACT AERS INHHALE TWO PUFFS INTO LUNGS DAILY    tiZANidine (ZANAFLEX) 2 MG tablet TAKE 1 TO 2 TABLETS BY MOUTH 3 TIMES DAILY AS NEEDED FOR MUSLCE SPASMS    No facility-administered encounter medications on file as of 12/14/2020.    Care Gaps: Mammogram Pneumococcal Vaccine (Last Completed 06/21/2015) Colonoscopy (Last Completed 08/07/2017) COVID-19 Vaccine (4- Booster Moderna Series) Influenza Vaccine Star Rating Drugs: Atorvastatin 40 mg last filled 10/17/2020 90 day supply at [facility]. Medication Fill Gaps: Methocarbamol 500 MG last filled 01/06/2020 90 day supply Ropinirole 4 MG last filled 09/23/2020 30 day supply  Current COPD regimen:  Ventolin HFA 108 mcg/act 2 puff every 4 hours as needed  Duoneb 45m nebulizer three times daily as needed  Spiriva 2.5 mcg/act 2 puffs daily  No flowsheet data found. Any recent hospitalizations or ED visits since last visit with CPP? No Reports COPD symptoms, including Increased shortness of breath  Patient states she feels her rescue inhaler may not be working as great as before,but not sure.Patient states every year around this time when the weather changes her breathing seems to be "acting up". What recent interventions/DTPs have been made by any provider to improve breathing since last visit:None ID Have you had exacerbation/flare-up since last visit? No Patient states she is not  having a exacerbation/flare-up or a cold, but she can tell her breathing is "acting up due to the weather change, it always gets better within a few days.I feel fine now". What do you do when you are short of breath?  Rescue medication  Respiratory Devices/Equipment Do you have a nebulizer? Yes Do you use a Peak Flow Meter? No Do you use a maintenance inhaler? Yes How often do you forget to use your daily inhaler?  Patient  states she forgets to use her inhaler two to three times a week because she will forget to place her inhalers in her pocket of book when she goes to take care of her sister.   Do you use a rescue inhaler? Yes How often do you use your rescue inhaler?  prn Do you use a spacer with your inhaler? Yes  Patient states she feels her rescue inhaler may not be working as great as before,but not sure.Patient states every year around this time when the weather changes her breathing seems to "act up". Adherence Review: Does the patient have >5 day gap between last estimated fill date for maintenance inhaler medications? No    Anderson Malta Clinical Production designer, theatre/television/film [phone]

## 2020-12-20 ENCOUNTER — Ambulatory Visit (INDEPENDENT_AMBULATORY_CARE_PROVIDER_SITE_OTHER): Payer: Medicare Other | Admitting: Nurse Practitioner

## 2020-12-20 ENCOUNTER — Encounter: Payer: Self-pay | Admitting: Nurse Practitioner

## 2020-12-20 ENCOUNTER — Other Ambulatory Visit: Payer: Self-pay

## 2020-12-20 ENCOUNTER — Ambulatory Visit: Payer: Medicare Other | Admitting: Family Medicine

## 2020-12-20 VITALS — BP 132/78 | HR 90 | Temp 98.6°F | Resp 16 | Ht 61.0 in | Wt 219.4 lb

## 2020-12-20 DIAGNOSIS — M62838 Other muscle spasm: Secondary | ICD-10-CM | POA: Diagnosis not present

## 2020-12-20 DIAGNOSIS — Z23 Encounter for immunization: Secondary | ICD-10-CM | POA: Diagnosis not present

## 2020-12-20 DIAGNOSIS — M542 Cervicalgia: Secondary | ICD-10-CM

## 2020-12-20 MED ORDER — METHOCARBAMOL 500 MG PO TABS: 500.0000 mg | ORAL_TABLET | Freq: Three times a day (TID) | ORAL | 0 refills | Status: AC

## 2020-12-20 NOTE — Progress Notes (Signed)
BP 132/78   Pulse 90   Temp 98.6 F (37 C) (Oral)   Resp 16   Ht 5\' 1"  (1.549 m)   Wt 219 lb 6.4 oz (99.5 kg)   LMP  (LMP Unknown)   SpO2 98%   BMI 41.46 kg/m    Subjective:    Patient ID: Nicole Austin, female    DOB: 10-25-1960, 60 y.o.   MRN: [id number]  HPI: Nicole Austin is a 60 y.o. female, here alone  Chief Complaint  Patient presents with   Numbness    Numbness and tingling right side of neck that radiates down to hand for 2 weeks   Neck pain/muscle spasm:  Sharp Pain 8/10, constant, radiates down right arm since 12/10/20.  She says she was lifting boxes and she thinks that's what caused the pain.  She has taken tylenol for pain.  She says it is not working.  She says she did have tingling but it has since resolved.  She has good distal pulse, motor, sensory.  She does not have decrease ROM.    Relevant past medical, surgical, family and social history reviewed and updated as indicated. Interim medical history since our last visit reviewed. Allergies and medications reviewed and updated.  Review of Systems  Constitutional: Negative for fever or weight change.  Respiratory: Negative for cough and shortness of breath.   Cardiovascular: Negative for chest pain or palpitations.  Gastrointestinal: Negative for abdominal pain, no bowel changes.  Musculoskeletal: Negative for gait problem or joint swelling. Positive for neck pain and muscle spasms Skin: Negative for rash.  Neurological: Negative for dizziness or headache.  No other specific complaints in a complete review of systems (except as listed in HPI above).      Objective:    BP 132/78   Pulse 90   Temp 98.6 F (37 C) (Oral)   Resp 16   Ht 5\' 1"  (1.549 m)   Wt 219 lb 6.4 oz (99.5 kg)   LMP  (LMP Unknown)   SpO2 98%   BMI 41.46 kg/m   Wt Readings from Last 3 Encounters:  12/20/20 219 lb 6.4 oz (99.5 kg)  07/06/20 217 lb 3.2 oz (98.5 kg)  04/22/20 215 lb (97.5 kg)    Physical  Exam  Constitutional: Patient appears well-developed and well-nourished. Obese No distress.  HEENT: head atraumatic, normocephalic, pupils equal and reactive to light, neck supple Cardiovascular: Normal rate, regular rhythm and normal heart sounds.  No murmur heard. No BLE edema. Pulmonary/Chest: Effort normal and breath sounds normal. No respiratory distress. Abdominal: Soft.  There is no tenderness. Musculoskeletal:  tenderness and tightness noted to trapezius muscle, no tenderness to cervical vertebrae, no decrease in ROM.   Psychiatric: Patient has a normal mood and affect. behavior is normal. Judgment and thought content normal.   Results for orders placed or performed in visit on 09/07/20  Hemoglobin A1c  Result Value Ref Range   Hemoglobin A1C 4.0       Assessment & Plan:   1. Neck pain -discussed heat therapy and massage - methocarbamol (ROBAXIN) 500 MG tablet; Take 1 tablet (500 mg total) by mouth 3 (three) times daily for 10 days.  Dispense: 30 tablet; Refill: 0 -Will get radiology if no improvement with muscle relaxer  2. Neck muscle spasm -discussed heat therapy and massage - methocarbamol (ROBAXIN) 500 MG tablet; Take 1 tablet (500 mg total) by mouth 3 (three) times daily for 10 days.  Dispense: 30 tablet;  Refill: 0  3. Need for influenza vaccination  - Flu Vaccine QUAD 6+ mos PF IM (Fluarix Quad PF)   Follow up plan: Return if symptoms worsen or fail to improve.

## 2020-12-31 ENCOUNTER — Other Ambulatory Visit: Payer: Self-pay

## 2020-12-31 ENCOUNTER — Encounter: Payer: Self-pay | Admitting: Emergency Medicine

## 2020-12-31 ENCOUNTER — Emergency Department: Payer: Medicare Other

## 2020-12-31 DIAGNOSIS — R059 Cough, unspecified: Secondary | ICD-10-CM | POA: Diagnosis not present

## 2020-12-31 DIAGNOSIS — R0602 Shortness of breath: Secondary | ICD-10-CM | POA: Insufficient documentation

## 2020-12-31 DIAGNOSIS — Z8616 Personal history of COVID-19: Secondary | ICD-10-CM | POA: Diagnosis not present

## 2020-12-31 DIAGNOSIS — Z5321 Procedure and treatment not carried out due to patient leaving prior to being seen by health care provider: Secondary | ICD-10-CM | POA: Diagnosis not present

## 2020-12-31 LAB — CBC WITH DIFFERENTIAL/PLATELET
Abs Immature Granulocytes: 0.08 10*3/uL — ABNORMAL HIGH (ref 0.00–0.07)
Basophils Absolute: 0.1 10*3/uL (ref 0.0–0.1)
Basophils Relative: 1 %
Eosinophils Absolute: 0.2 10*3/uL (ref 0.0–0.5)
Eosinophils Relative: 1 %
HCT: 38.9 % (ref 36.0–46.0)
Hemoglobin: 12.9 g/dL (ref 12.0–15.0)
Immature Granulocytes: 1 %
Lymphocytes Relative: 13 %
Lymphs Abs: 1.8 10*3/uL (ref 0.7–4.0)
MCH: 30.9 pg (ref 26.0–34.0)
MCHC: 33.2 g/dL (ref 30.0–36.0)
MCV: 93.1 fL (ref 80.0–100.0)
Monocytes Absolute: 1.7 10*3/uL — ABNORMAL HIGH (ref 0.1–1.0)
Monocytes Relative: 12 %
Neutro Abs: 10.1 10*3/uL — ABNORMAL HIGH (ref 1.7–7.7)
Neutrophils Relative %: 72 %
Platelets: 234 10*3/uL (ref 150–400)
RBC: 4.18 MIL/uL (ref 3.87–5.11)
RDW: 12.8 % (ref 11.5–15.5)
WBC: 13.9 10*3/uL — ABNORMAL HIGH (ref 4.0–10.5)
nRBC: 0 % (ref 0.0–0.2)

## 2020-12-31 LAB — BASIC METABOLIC PANEL
Anion gap: 8 (ref 5–15)
BUN: 12 mg/dL (ref 8–23)
CO2: 31 mmol/L (ref 22–32)
Calcium: 8.5 mg/dL — ABNORMAL LOW (ref 8.9–10.3)
Chloride: 96 mmol/L — ABNORMAL LOW (ref 98–111)
Creatinine, Ser: 0.94 mg/dL (ref 0.44–1.00)
GFR, Estimated: >60 mL/min (ref >60)
Glucose, Bld: 104 mg/dL — ABNORMAL HIGH (ref 70–99)
Potassium: 3.6 mmol/L (ref 3.5–5.1)
Sodium: 135 mmol/L (ref 135–145)

## 2020-12-31 NOTE — ED Triage Notes (Signed)
Pt to ED via [facility] from home for shortness of breath and cough. Pt had recent COVID exposure. EMS had pt on 4 liters of O2.Pt does not wear oxygen at home.

## 2020-12-31 NOTE — ED Triage Notes (Signed)
Pt in via EMS from home with c/o SOB since yesterday. Pt 98% on 4L. Pt with wet cough, 134/89, 96HR. FSBS 116, 16RR, recent covid exposure

## 2020-12-31 NOTE — ED Provider Notes (Addendum)
Emergency Medicine Provider Triage Evaluation Note  Nicole Austin , a 60 y.o. female  was evaluated in triage.  Pt complains of SHOB and fatigue  O2 sat is 96-97% on O2 which she does not wear normally - Will trial off and check O2 sat .- O2 sat dropped to 82% and she was placed back on O2   Review of Systems  Positive: Reports SHOB Negative: Cough, CP, dizziness, N/V/D  Physical Exam  BP (!) 109/55 (BP Location: Right Arm)   Pulse 97   Temp 98.6 F (37 C) (Oral)   Resp 20   Ht 5\' 1"  (1.549 m)   Wt 99.8 kg   LMP  (LMP Unknown)   SpO2 98%   BMI 41.57 kg/m  Gen:   Awake, no distress   Resp:  Normal effort  MSK:   Moves extremities without difficulty  Other:    Medical Decision Making  Medically screening exam initiated at 4:20 PM.  Appropriate orders placed.  Nicole Austin was informed that the remainder of the evaluation will be completed by another provider, this initial triage assessment does not replace that evaluation, and the importance of remaining in the ED until their evaluation is complete.  Pt will have normal SHOB work up with CXR and basic labs  Will place on O2   Willaim Rayas, NP 12/31/20 1622    Willaim Rayas, NP 12/31/20 1629    Nance Pear, MD 12/31/20 1904

## 2021-01-01 ENCOUNTER — Emergency Department (HOSPITAL_COMMUNITY): Payer: Medicare Other

## 2021-01-01 ENCOUNTER — Inpatient Hospital Stay (HOSPITAL_COMMUNITY)
Admission: EM | Admit: 2021-01-01 | Discharge: 2021-01-05 | DRG: 190 | Disposition: A | Discharge status: Home Health | Payer: Medicare Other | Attending: Internal Medicine | Admitting: Internal Medicine

## 2021-01-01 ENCOUNTER — Other Ambulatory Visit: Payer: Self-pay

## 2021-01-01 ENCOUNTER — Emergency Department
Admission: EM | Admit: 2021-01-01 | Discharge: 2021-01-01 | Disposition: A | Discharge status: Home / Self Care | Payer: Medicare Other | Attending: Student | Admitting: Student

## 2021-01-01 ENCOUNTER — Encounter (HOSPITAL_COMMUNITY): Payer: Self-pay | Admitting: Internal Medicine

## 2021-01-01 DIAGNOSIS — F17210 Nicotine dependence, cigarettes, uncomplicated: Secondary | ICD-10-CM | POA: Diagnosis present

## 2021-01-01 DIAGNOSIS — Z720 Tobacco use: Secondary | ICD-10-CM | POA: Diagnosis present

## 2021-01-01 DIAGNOSIS — R7303 Prediabetes: Secondary | ICD-10-CM | POA: Diagnosis present

## 2021-01-01 DIAGNOSIS — G2581 Restless legs syndrome: Secondary | ICD-10-CM | POA: Diagnosis present

## 2021-01-01 DIAGNOSIS — G8929 Other chronic pain: Secondary | ICD-10-CM | POA: Diagnosis present

## 2021-01-01 DIAGNOSIS — J441 Chronic obstructive pulmonary disease with (acute) exacerbation: Secondary | ICD-10-CM | POA: Diagnosis not present

## 2021-01-01 DIAGNOSIS — F909 Attention-deficit hyperactivity disorder, unspecified type: Secondary | ICD-10-CM | POA: Diagnosis present

## 2021-01-01 DIAGNOSIS — F4310 Post-traumatic stress disorder, unspecified: Secondary | ICD-10-CM | POA: Diagnosis present

## 2021-01-01 DIAGNOSIS — M810 Age-related osteoporosis without current pathological fracture: Secondary | ICD-10-CM | POA: Diagnosis present

## 2021-01-01 DIAGNOSIS — G20 Parkinson's disease: Secondary | ICD-10-CM | POA: Diagnosis present

## 2021-01-01 DIAGNOSIS — Z20822 Contact with and (suspected) exposure to covid-19: Secondary | ICD-10-CM | POA: Diagnosis present

## 2021-01-01 DIAGNOSIS — Z818 Family history of other mental and behavioral disorders: Secondary | ICD-10-CM

## 2021-01-01 DIAGNOSIS — G4733 Obstructive sleep apnea (adult) (pediatric): Secondary | ICD-10-CM | POA: Diagnosis present

## 2021-01-01 DIAGNOSIS — G4730 Sleep apnea, unspecified: Secondary | ICD-10-CM | POA: Diagnosis present

## 2021-01-01 DIAGNOSIS — M797 Fibromyalgia: Secondary | ICD-10-CM | POA: Diagnosis present

## 2021-01-01 DIAGNOSIS — J9621 Acute and chronic respiratory failure with hypoxia: Secondary | ICD-10-CM | POA: Diagnosis present

## 2021-01-01 DIAGNOSIS — Z7951 Long term (current) use of inhaled steroids: Secondary | ICD-10-CM

## 2021-01-01 DIAGNOSIS — E786 Lipoprotein deficiency: Secondary | ICD-10-CM

## 2021-01-01 DIAGNOSIS — F419 Anxiety disorder, unspecified: Secondary | ICD-10-CM | POA: Diagnosis present

## 2021-01-01 DIAGNOSIS — Z9114 Patient's other noncompliance with medication regimen: Secondary | ICD-10-CM

## 2021-01-01 DIAGNOSIS — M1990 Unspecified osteoarthritis, unspecified site: Secondary | ICD-10-CM | POA: Diagnosis present

## 2021-01-01 DIAGNOSIS — R739 Hyperglycemia, unspecified: Secondary | ICD-10-CM | POA: Diagnosis present

## 2021-01-01 DIAGNOSIS — R9431 Abnormal electrocardiogram [ECG] [EKG]: Secondary | ICD-10-CM | POA: Diagnosis present

## 2021-01-01 DIAGNOSIS — Z6841 Body Mass Index (BMI) 40.0 and over, adult: Secondary | ICD-10-CM

## 2021-01-01 DIAGNOSIS — Z791 Long term (current) use of non-steroidal anti-inflammatories (NSAID): Secondary | ICD-10-CM

## 2021-01-01 DIAGNOSIS — Z885 Allergy status to narcotic agent status: Secondary | ICD-10-CM

## 2021-01-01 DIAGNOSIS — K219 Gastro-esophageal reflux disease without esophagitis: Secondary | ICD-10-CM | POA: Diagnosis present

## 2021-01-01 DIAGNOSIS — Z8673 Personal history of transient ischemic attack (TIA), and cerebral infarction without residual deficits: Secondary | ICD-10-CM

## 2021-01-01 DIAGNOSIS — Z825 Family history of asthma and other chronic lower respiratory diseases: Secondary | ICD-10-CM

## 2021-01-01 DIAGNOSIS — I252 Old myocardial infarction: Secondary | ICD-10-CM

## 2021-01-01 DIAGNOSIS — J449 Chronic obstructive pulmonary disease, unspecified: Secondary | ICD-10-CM

## 2021-01-01 DIAGNOSIS — Z79899 Other long term (current) drug therapy: Secondary | ICD-10-CM

## 2021-01-01 DIAGNOSIS — E875 Hyperkalemia: Secondary | ICD-10-CM | POA: Diagnosis present

## 2021-01-01 DIAGNOSIS — E785 Hyperlipidemia, unspecified: Secondary | ICD-10-CM | POA: Diagnosis present

## 2021-01-01 DIAGNOSIS — M109 Gout, unspecified: Secondary | ICD-10-CM | POA: Diagnosis present

## 2021-01-01 DIAGNOSIS — F319 Bipolar disorder, unspecified: Secondary | ICD-10-CM | POA: Diagnosis present

## 2021-01-01 DIAGNOSIS — Z820 Family history of epilepsy and other diseases of the nervous system: Secondary | ICD-10-CM

## 2021-01-01 DIAGNOSIS — Z833 Family history of diabetes mellitus: Secondary | ICD-10-CM

## 2021-01-01 LAB — BLOOD GAS, VENOUS
Acid-Base Excess: 1.1 mmol/L (ref 0.0–2.0)
Bicarbonate: 26.9 mmol/L (ref 20.0–28.0)
O2 Saturation: 88.9 %
Patient temperature: 98.6
pCO2, Ven: 50.1 mmHg (ref 44.0–60.0)
pH, Ven: 7.349 (ref 7.250–7.430)
pO2, Ven: 61 mmHg — ABNORMAL HIGH (ref 32.0–45.0)

## 2021-01-01 LAB — BASIC METABOLIC PANEL
Anion gap: 11 (ref 5–15)
BUN: 14 mg/dL (ref 8–23)
CO2: 27 mmol/L (ref 22–32)
Calcium: 8.5 mg/dL — ABNORMAL LOW (ref 8.9–10.3)
Chloride: 97 mmol/L — ABNORMAL LOW (ref 98–111)
Creatinine, Ser: 0.91 mg/dL (ref 0.44–1.00)
GFR, Estimated: >60 mL/min (ref >60)
Glucose, Bld: 159 mg/dL — ABNORMAL HIGH (ref 70–99)
Potassium: 4 mmol/L (ref 3.5–5.1)
Sodium: 135 mmol/L (ref 135–145)

## 2021-01-01 LAB — BRAIN NATRIURETIC PEPTIDE: B Natriuretic Peptide: 91.8 pg/mL (ref 0.0–100.0)

## 2021-01-01 LAB — CBC WITH DIFFERENTIAL/PLATELET
Abs Immature Granulocytes: 0.08 10*3/uL — ABNORMAL HIGH (ref 0.00–0.07)
Basophils Absolute: 0 10*3/uL (ref 0.0–0.1)
Basophils Relative: 0 %
Eosinophils Absolute: 0.1 10*3/uL (ref 0.0–0.5)
Eosinophils Relative: 1 %
HCT: 39 % (ref 36.0–46.0)
Hemoglobin: 12.9 g/dL (ref 12.0–15.0)
Immature Granulocytes: 1 %
Lymphocytes Relative: 15 %
Lymphs Abs: 1.4 10*3/uL (ref 0.7–4.0)
MCH: 31 pg (ref 26.0–34.0)
MCHC: 33.1 g/dL (ref 30.0–36.0)
MCV: 93.8 fL (ref 80.0–100.0)
Monocytes Absolute: 0.9 10*3/uL (ref 0.1–1.0)
Monocytes Relative: 10 %
Neutro Abs: 7.2 10*3/uL (ref 1.7–7.7)
Neutrophils Relative %: 73 %
Platelets: 251 10*3/uL (ref 150–400)
RBC: 4.16 MIL/uL (ref 3.87–5.11)
RDW: 13.2 % (ref 11.5–15.5)
WBC: 9.8 10*3/uL (ref 4.0–10.5)
nRBC: 0 % (ref 0.0–0.2)

## 2021-01-01 LAB — RESP PANEL BY RT-PCR (FLU A&B, COVID) ARPGX2
Influenza A by PCR: NEGATIVE
Influenza B by PCR: NEGATIVE
SARS Coronavirus 2 by RT PCR: NEGATIVE

## 2021-01-01 MED ORDER — PREDNISONE 20 MG PO TABS
40.0000 mg | ORAL_TABLET | Freq: Every day | ORAL | Status: AC
  Administered 2021-01-02 – 2021-01-05 (×4): 40 mg via ORAL
  Filled 2021-01-01 (×4): qty 2

## 2021-01-01 MED ORDER — ONDANSETRON HCL 4 MG/2ML IJ SOLN: 4.0000 mg | Freq: Once | INTRAMUSCULAR | Status: DC | PRN

## 2021-01-01 MED ORDER — DIVALPROEX SODIUM 250 MG PO DR TAB
250.0000 mg | DELAYED_RELEASE_TABLET | Freq: Two times a day (BID) | ORAL | Status: DC
  Administered 2021-01-01 – 2021-01-05 (×8): 250 mg via ORAL
  Filled 2021-01-01 (×8): qty 1

## 2021-01-01 MED ORDER — ENOXAPARIN SODIUM 40 MG/0.4ML IJ SOSY
40.0000 mg | PREFILLED_SYRINGE | INTRAMUSCULAR | Status: DC
  Administered 2021-01-01 – 2021-01-04 (×4): 40 mg via SUBCUTANEOUS
  Filled 2021-01-01 (×4): qty 0.4

## 2021-01-01 MED ORDER — ALBUTEROL (5 MG/ML) CONTINUOUS INHALATION SOLN
10.0000 mg/h | INHALATION_SOLUTION | Freq: Once | RESPIRATORY_TRACT | Status: AC
  Administered 2021-01-01: 10 mg/h via RESPIRATORY_TRACT

## 2021-01-01 MED ORDER — ACETAMINOPHEN 325 MG PO TABS
650.0000 mg | ORAL_TABLET | Freq: Four times a day (QID) | ORAL | Status: DC | PRN
  Administered 2021-01-01 – 2021-01-02 (×2): 650 mg via ORAL
  Filled 2021-01-01 (×2): qty 2

## 2021-01-01 MED ORDER — LURASIDONE HCL 20 MG PO TABS
60.0000 mg | ORAL_TABLET | Freq: Every day | ORAL | Status: DC
  Administered 2021-01-01 – 2021-01-04 (×4): 60 mg via ORAL
  Filled 2021-01-01 (×3): qty 3
  Filled 2021-01-01: qty 1
  Filled 2021-01-01 (×2): qty 3

## 2021-01-01 MED ORDER — ONDANSETRON HCL 4 MG/2ML IJ SOLN: 4.0000 mg | Freq: Four times a day (QID) | INTRAMUSCULAR | Status: DC | PRN

## 2021-01-01 MED ORDER — ESCITALOPRAM OXALATE 10 MG PO TABS
20.0000 mg | ORAL_TABLET | Freq: Every day | ORAL | Status: DC
  Administered 2021-01-01 – 2021-01-05 (×5): 20 mg via ORAL
  Filled 2021-01-01 (×5): qty 2

## 2021-01-01 MED ORDER — METHYLPREDNISOLONE SODIUM SUCC 125 MG IJ SOLR: 125.0000 mg | Freq: Once | INTRAMUSCULAR | Status: DC

## 2021-01-01 MED ORDER — METHYLPREDNISOLONE SODIUM SUCC 40 MG IJ SOLR
40.0000 mg | Freq: Once | INTRAMUSCULAR | Status: AC
  Administered 2021-01-01: 40 mg via INTRAVENOUS
  Filled 2021-01-01: qty 1

## 2021-01-01 MED ORDER — ROPINIROLE HCL ER 4 MG PO TB24
4.0000 mg | ORAL_TABLET | Freq: Every day | ORAL | Status: DC
  Administered 2021-01-01 – 2021-01-05 (×4): 4 mg via ORAL
  Filled 2021-01-01 (×6): qty 1

## 2021-01-01 MED ORDER — LACTATED RINGERS IV BOLUS
1000.0000 mL | Freq: Once | INTRAVENOUS | Status: AC
  Administered 2021-01-01: 1000 mL via INTRAVENOUS

## 2021-01-01 MED ORDER — PROCHLORPERAZINE EDISYLATE 10 MG/2ML IJ SOLN: 5.0000 mg | INTRAMUSCULAR | Status: DC | PRN

## 2021-01-01 MED ORDER — POTASSIUM CHLORIDE IN NACL 20-0.45 MEQ/L-% IV SOLN
INTRAVENOUS | Status: DC
  Filled 2021-01-01 (×3): qty 1000

## 2021-01-01 MED ORDER — ACETAMINOPHEN 650 MG RE SUPP: 650.0000 mg | Freq: Four times a day (QID) | RECTAL | Status: DC | PRN

## 2021-01-01 MED ORDER — ALPRAZOLAM 0.5 MG PO TABS
0.5000 mg | ORAL_TABLET | Freq: Three times a day (TID) | ORAL | Status: DC | PRN
  Administered 2021-01-01 – 2021-01-05 (×11): 0.5 mg via ORAL
  Filled 2021-01-01 (×11): qty 1

## 2021-01-01 MED ORDER — ATORVASTATIN CALCIUM 40 MG PO TABS
40.0000 mg | ORAL_TABLET | Freq: Every day | ORAL | Status: DC
  Administered 2021-01-01 – 2021-01-04 (×4): 40 mg via ORAL
  Filled 2021-01-01 (×4): qty 1

## 2021-01-01 MED ORDER — IPRATROPIUM-ALBUTEROL 0.5-2.5 (3) MG/3ML IN SOLN: 3.0000 mL | Freq: Once | RESPIRATORY_TRACT | Status: DC

## 2021-01-01 MED ORDER — ALBUTEROL SULFATE (2.5 MG/3ML) 0.083% IN NEBU
INHALATION_SOLUTION | RESPIRATORY_TRACT | Status: AC
  Administered 2021-01-01: 10 mg
  Filled 2021-01-01: qty 12

## 2021-01-01 MED ORDER — NICOTINE 21 MG/24HR TD PT24
21.0000 mg | MEDICATED_PATCH | Freq: Every day | TRANSDERMAL | Status: DC
  Administered 2021-01-01 – 2021-01-05 (×5): 21 mg via TRANSDERMAL
  Filled 2021-01-01 (×5): qty 1

## 2021-01-01 MED ORDER — MAGNESIUM SULFATE 2 GM/50ML IV SOLN
2.0000 g | Freq: Once | INTRAVENOUS | Status: AC
  Administered 2021-01-01: 2 g via INTRAVENOUS
  Filled 2021-01-01: qty 50

## 2021-01-01 MED ORDER — ONDANSETRON HCL 4 MG PO TABS: 4.0000 mg | ORAL_TABLET | Freq: Four times a day (QID) | ORAL | Status: DC | PRN

## 2021-01-01 MED ORDER — IPRATROPIUM-ALBUTEROL 0.5-2.5 (3) MG/3ML IN SOLN
3.0000 mL | Freq: Four times a day (QID) | RESPIRATORY_TRACT | Status: DC
  Administered 2021-01-01 – 2021-01-02 (×5): 3 mL via RESPIRATORY_TRACT
  Filled 2021-01-01 (×5): qty 3

## 2021-01-01 NOTE — H&P (Signed)
History and Physical    Nicole Austin HBZ:[id number] DOB: 1960-06-09 DOA: 01/01/2021  PCP: Delsa Grana, PA-C   Patient coming from: Home.  I have personally briefly reviewed patient's old medical records in [place]  Chief Complaint: Shortness of breath.  HPI: Nicole Austin is a 60year-old female with a past medical history of bipolar disorder, anxiety, ADD, tobacco use, polysubstance abuse, hyperlipidemia, ADD, fibromyalgia, sleep apnea, osteoporosis, morbid obesity who is coming to the emergency department for the second time with in the last 2 days due to progressively worse dyspnea associated with wheezing, dry cough, sore throat and fatigue.  The patient stated that she contracted a URI 2 to 3 days before the symptoms started.  She has chills, but no fever or night sweats.  Denied hemoptysis or sputum production.  She has had decreased appetite and difficulty sleeping.  No chest pain, palpitations, diaphoresis, PND, orthopnea or pitting edema of the lower extremities.  She denied abdominal pain, nausea, vomiting, diarrhea, melena or hematochezia.  No flank pain, dysuria, frequency or hematuria.  No polyuria, polydipsia, polyphagia or blurred vision.  ED Course: Initial vital signs were temperature 97.9 F, pulse 121, respiration 16, BP 152/84 mmHg O2 sat 96% on nasal cannula oxygen at 4 LPM.  Patient received Solu-Medrol 125 mg IVP by EMS and a nebulizer treatment.  She received a continuous neb of 10 mg of albuterol in the emergency department.  Lab work: Her CBC was normal.  Venous blood gas was unremarkable except for a high PO2 of 61 mmHg.  BNP was normal.  CMP showed chloride 97 mmol/L, glucose 159.5 mg/dL.  Imaging: Right chest radiograph did not show any acute cardiopulmonary abnormality.  Please see images and full radiology report for further details.  Review of Systems: As per HPI otherwise all other systems reviewed and are negative.  Past Medical History:   Diagnosis Date   ADHD (attention deficit hyperactivity disorder)    Apnea, sleep 06/30/2015   Arthritis    Asthma    Asthma with acute exacerbation 06/30/2015   Bipolar 1 disorder (HCC)    Chronic pain    COPD (chronic obstructive pulmonary disease) ([place]) 11/2018   **LUNG SOUNDS WHEEZING AND WITH RALES**   COPD, moderate ([place]) 11/18/2013   Overview:  Last Assessment & Plan:  Refer to pulmonologist Overview:  Overview:  Last Assessment & Plan:  Pneumonia vaccine offered and given today; she did not tolerate LABA; will start her on Spiriva; stop combivent neb; use only SABA neb OR inhaler, not both; smoking cessation urged, and I am here to help if she wants assistance quitting; f/u in 4 weeks Last Assessment & Plan:  Pneumonia vaccin   Depression    Dyspnea    with exertion   GERD (gastroesophageal reflux disease) 08/30/2015   Gout 08/16/2014   History of acute myocardial infarction 06/30/2015   Overview:  [facility]    HPV (human papilloma virus) infection 07/17/2013   Hyperlipidemia    Low HDL (under 40) 08/30/2015   MI (myocardial infarction) ([place]) 2018   Parkinson disease ([place])    Pre-diabetes    Prediabetes 05/20/2016   PTSD (post-traumatic stress disorder)    history of panic attacks   PTSD (post-traumatic stress disorder)    PTSD (post-traumatic stress disorder)    Restless leg syndrome    Rhabdomyolysis    Sleep apnea 06/30/2015   Stage 3 severe COPD by GOLD classification ([place]) 11/18/2013   Stroke ([place]) 2018   Tremors  of nervous system    hands   Uncomplicated asthma 09/27/1570   Varicella 02/25/2017   Past Surgical History:  Procedure Laterality Date   BACK SURGERY     CATARACT EXTRACTION W/PHACO Right 01/01/2019   Procedure: CATARACT EXTRACTION PHACO AND INTRAOCULAR LENS PLACEMENT (IOC) right vision blue;  Surgeon: Marchia Meiers, MD;  Location: [facility] ORS;  Service: Ophthalmology;  Laterality: Right;  [place] 00:39.4 CDE 5.49 Fluid Pack lot # [id number]   CATARACT EXTRACTION W/PHACO Left  01/29/2019   Procedure: CATARACT EXTRACTION PHACO AND INTRAOCULAR LENS PLACEMENT ([place]) LEFT Vision Blue;  Surgeon: Marchia Meiers, MD;  Location: [facility] ORS;  Service: Ophthalmology;  Laterality: Left;  [place] 00:51.1 CDE 4.38 Fluid Pack Lot # [id number]   COLONOSCOPY WITH PROPOFOL N/A 08/07/2017   Procedure: COLONOSCOPY WITH PROPOFOL;  Surgeon: Virgel Manifold, MD;  Location: [facility] ENDOSCOPY;  Service: Endoscopy;  Laterality: N/A;   EYE SURGERY     JOINT REPLACEMENT Left 2016   tkr   REPLACEMENT TOTAL KNEE Left 2016   Social History  reports that she has been smoking cigarettes. She has a 36.00 pack-year smoking history. She has never used smokeless tobacco. She reports that she does not drink alcohol and does not use drugs.  Allergies  Allergen Reactions   Chantix [Varenicline] Nausea Only   Codeine Nausea And Vomiting   Family History  Problem Relation Age of Onset   Hernia Mother    Heart disease Mother    OCD Mother    Diabetes Mother    Parkinson's disease Father    Bipolar disorder Sister    Schizophrenia Sister    ADD / ADHD Sister    Alcohol abuse Brother    Bipolar disorder Sister    Paranoid behavior Sister    ADD / ADHD Sister    ADD / ADHD Son    Dementia Maternal Grandmother    Emphysema Maternal Grandfather    ADD / ADHD Son    ADD / ADHD Son    Depression Son    Prior to Admission medications   Medication Sig Start Date End Date Taking? Authorizing Provider  albuterol (VENTOLIN HFA) 108 (90 Base) MCG/ACT inhaler Inhale 2 puffs into the lungs every 4 (four) hours as needed for wheezing or shortness of breath. 02/04/20  Yes Delsa Grana, PA-C  ALPRAZolam Duanne Moron) 0.5 MG tablet Take 0.5 mg by mouth 4 (four) times daily.   Yes [provider]  atorvastatin (LIPITOR) 40 MG tablet Take 1 tablet (40 mg total) by mouth at bedtime. 07/06/20  Yes Delsa Grana, PA-C  divalproex (DEPAKOTE) 250 MG DR tablet Take 1 tablet (250 mg total) by mouth 2 (two) times daily.  11/19/16  Yes Rainey Pines, MD  escitalopram (LEXAPRO) 20 MG tablet Take 1 tablet (20 mg total) by mouth daily. 11/19/16  Yes Rainey Pines, MD  ipratropium-albuterol (DUONEB) 0.5-2.5 (3) MG/3ML SOLN Take 3 mLs by nebulization 3 (three) times daily as needed. Patient taking differently: Take 3 mLs by nebulization 3 (three) times daily as needed (wheezing, shortness of breath). 12/07/19  Yes Tapia, [name], PA-C  LATUDA 60 MG TABS Take 60 mg by mouth at bedtime.  01/19/19  Yes [provider]  meloxicam (MOBIC) 15 MG tablet Take 15 mg by mouth daily. 04/27/20  Yes [provider]  Multiple Vitamin (MULTIVITAMIN WITH MINERALS) TABS tablet Take 1 tablet by mouth daily.   Yes [provider]  nystatin ointment (MYCOSTATIN) APPLY ONE APPLICATION AS INTRUCTED TWO TIMES DAILY. Patient  taking differently: Apply 1 application topically 2 (two) times daily. 08/31/20  Yes Delsa Grana, PA-C  promethazine (PHENERGAN) 25 MG tablet Take 25 mg by mouth 2 (two) times daily as needed for nausea or vomiting.   Yes [provider]  rOPINIRole (REQUIP XL) 4 MG 24 hr tablet Take 4 mg by mouth daily. 02/23/20  Yes [provider]  Tiotropium Bromide Monohydrate (SPIRIVA RESPIMAT) 2.5 MCG/ACT AERS INHHALE TWO PUFFS INTO LUNGS DAILY Patient taking differently: Inhale 2 puffs into the lungs daily. 02/23/20  Yes Delsa Grana, PA-C  tizanidine (ZANAFLEX) 2 MG capsule Take 2-4 mg by mouth 3 (three) times daily as needed for muscle spasms.   Yes [provider]  amantadine (SYMMETREL) 100 MG capsule Take 100 mg by mouth 2 (two) times daily. Patient not taking: Reported on 01/01/2021 03/21/20   [provider]  carbidopa-levodopa (SINEMET IR) 25-100 MG tablet Take 1 tablet by mouth. 06/17/20   [provider]  nystatin-triamcinolone ointment (MYCOLOG) Apply 1 application topically 2 (two) times daily. Patient not taking: Reported on 01/01/2021 04/23/20   Ward, Delice Bison,  DO   Physical Exam: Vitals:   01/01/21 1245 01/01/21 1300 01/01/21 1315 01/01/21 1330  BP: (!) 140/56 (!) 136/56 (!) 141/67 (!) 138/102  Pulse: (!) 105 (!) 107 (!) 114 (!) 123  Resp:  18  18  Temp:      TempSrc:      SpO2: 100% 100% 96% 96%   Constitutional: NAD, calm, comfortable Eyes: PERRL, lids and conjunctivae normal.  Injected sclera bilaterally. ENMT: Mucous membranes are moist. Posterior pharynx mildly erythematous. Neck: normal, supple, no masses, no thyromegaly Respiratory: Decreased breath sounds with bilateral rhonchi and wheezing.  No crackles. Normal respiratory effort. No accessory muscle use.  Cardiovascular: Occasionally tachycardic in the low 100, no murmurs / rubs / gallops. No extremity edema. 2+ pedal pulses. No carotid bruits.  Abdomen: Obese, soft, no tenderness, no masses palpated. No hepatosplenomegaly. Bowel sounds positive.  Musculoskeletal: Mild generalized weakness.  No clubbing / cyanosis. Good ROM, no contractures. Normal muscle tone.  Skin: no acute rashes, lesions, ulcers on limited dermatological examination. Neurologic: CN 2-12 grossly intact. Sensation intact, DTR normal. Strength 5/5 in all 4.  Psychiatric: Normal judgment and insight. Alert and oriented x 3. Normal mood.   Labs on Admission: I have personally reviewed following labs and imaging studies  CBC: Recent Labs  Lab 12/31/20 1617 01/01/21 1030  WBC 13.9* 9.8  NEUTROABS 10.1* 7.2  HGB 12.9 12.9  HCT 38.9 39.0  MCV 93.1 93.8  PLT 234 619    Basic Metabolic Panel: Recent Labs  Lab 12/31/20 1617 01/01/21 1030  NA 135 135  K 3.6 4.0  CL 96* 97*  CO2 31 27  GLUCOSE 104* 159*  BUN 12 14  CREATININE 0.94 0.91  CALCIUM 8.5* 8.5*    GFR: Estimated Creatinine Clearance: 70.3 mL/min (by C-G formula based on SCr of 0.91 mg/dL).  Liver Function Tests: No results for input(s): AST, ALT, ALKPHOS, BILITOT, PROT, ALBUMIN in the last 168 hours.  Radiological Exams on  Admission: DG Chest 2 View  Result Date: 12/31/2020 CLINICAL DATA:  Shortness of breath. EXAM: CHEST - 2 VIEW COMPARISON:  January 10, 2018 FINDINGS: Cardiomediastinal silhouette is normal. Mediastinal contours appear intact. There is no evidence of focal airspace consolidation, pleural effusion or pneumothorax. Osseous structures are without acute abnormality. Soft tissues are grossly normal. IMPRESSION: No active cardiopulmonary disease. Electronically Signed   By: Fidela Salisbury  M.D.   On: 12/31/2020 17:25   DG Chest Port 1 View  Result Date: 01/01/2021 CLINICAL DATA:  Shortness of breath. EXAM: PORTABLE CHEST 1 VIEW COMPARISON:  December 31, 2020 FINDINGS: Cardiomediastinal silhouette is normal. Mediastinal contours appear intact. There is no evidence of focal airspace consolidation, pleural effusion or pneumothorax. Osseous structures are without acute abnormality. Soft tissues are grossly normal. IMPRESSION: No active disease. Electronically Signed   By: Fidela Salisbury M.D.   On: 01/01/2021 11:28    EKG: Independently reviewed.  Vent. rate 111 BPM PR interval 153 ms QRS duration 84 ms QT/QTcB 380/517 ms P-R-T axes 64 42 -45 Sinus tachycardia Borderline repolarization abnormality Prolonged QT interval  Assessment/Plan Principal Problem:   COPD with acute exacerbation (HCC) Observation/telemetry. Continue supplemental oxygen for Scheduled and as needed bronchodilators. Solu-Medrol 40 mg IVP this evening. Begin prednisone taper in AM. Smoking cessation advised.  Active Problems:   Prolonged QT interval Magnesium sulfate 2 g IVPB given. Keep electrolytes optimized. Continue cardio monitoring. Follow-up EKG in AM.. If possible avoid QT prolonging meds.    Current tobacco use Tobacco cessation encouraged. Discussed briefly cardiovascular complications. Discussed briefly risk of cancer. NicoDerm CQ 21 mg patch ordered. Staff to provide written smoking cessation  info.    Sleep apnea CPAP at bedtime.    Bipolar disorder with depression (HCC) Continue Depakote 250 mg p.o. twice daily. Continue Lexapro 20 mg p.o. daily. Continue lactulose 60 mg p.o. bedtime. Continue Xanax 0.5 mg p.o. 3 times daily as needed. Follow-up as an OP with PCP and behavioral health.    Morbid (severe) obesity due to excess calories (HCC) Lifestyle modifications. Follow-up with PCP.    Hyperglycemia Nonfasting level. Check fasting glucose. Monitor CBG check hemoglobin A1c if elevated.    Hyperlipidemia Continue atorvastatin 40 mg p.o. daily.    DVT prophylaxis: Lovenox SQ. Code Status:   Full code. Family Communication:   Disposition Plan:   Patient is from:  Home.  Anticipated DC to:  Home.  Anticipated DC date:  01/03/19/2022 or 01/03/2021.  Anticipated DC barriers: Clinical status. Consults called:   Admission status:  Observation/telemetry.  Severity of Illness:  High severity after presenting for the second day in the route to the emergency department due to progressively worsening dyspnea for several days.  The patient will remain in the hospital for 24 to 48 hours.  COPD with acute exacerbation treatment.  Reubin Milan MD [facility]  How to contact the [facility] Attending or Consulting provider [place] or covering provider during after hours [place], for this patient?   Check the care team in [facility] and look for a) attending/consulting [facility] provider listed and b) the [facility] team listed Log into [email or web address] and use [place]'s universal password to access. If you do not have the password, please contact the hospital operator. Locate the [facility] provider you are looking for under [facility] and page to a number that you can be directly reached. If you still have difficulty reaching the provider, please page the [facility] (Director on Call) for the Hospitalists listed on amion for assistance.  01/01/2021, 3:37 PM   This document was prepared with  Dragon voice recognition software and may contain some unintended transcription errors.

## 2021-01-01 NOTE — ED Provider Notes (Signed)
[facility] DEPT Provider Note   CSN: [id number] Arrival date & time: 01/01/21  1062     History Chief Complaint  Patient presents with   Shortness of Breath    Nicole Austin is a 60 y.o. female with progressively worsening shortness of breath for the last week.  History of COPD but not on any oxygen at home.  States she was on oxygen in the past but her tanks expired and she did not wear it all the time anyway so she never pursued replacement.  Called EMS this morning and upon events arrival patient was clammy, pale, with significantly increased work of breathing.  She received 20 mg of albuterol and 2 of Atrovent, received Solu-Medrol 125 IV.  Was hypoxic to the 80s upon their arrival, required 4 L by nasal cannula for improvement in her sats above 94%. Patient states she has been doing her albuterol and nebulizer treatments at home without improvement  I have personally reviewed this patient's medical records.  She has history of COPD, fibromyalgia, sleep apnea, polysubstance abuse.  She is not anticoagulated.  HPI     Past Medical History:  Diagnosis Date   ADHD (attention deficit hyperactivity disorder)    Apnea, sleep 06/30/2015   Arthritis    Asthma    Asthma with acute exacerbation 06/30/2015   Bipolar 1 disorder (HCC)    Chronic pain    COPD (chronic obstructive pulmonary disease) ([place]) 11/2018   **LUNG SOUNDS WHEEZING AND WITH RALES**   COPD, moderate ([place]) 11/18/2013   Overview:  Last Assessment & Plan:  Refer to pulmonologist Overview:  Overview:  Last Assessment & Plan:  Pneumonia vaccine offered and given today; she did not tolerate LABA; will start her on Spiriva; stop combivent neb; use only SABA neb OR inhaler, not both; smoking cessation urged, and I am here to help if she wants assistance quitting; f/u in 4 weeks Last Assessment & Plan:  Pneumonia vaccin   Depression    Dyspnea    with exertion   GERD (gastroesophageal reflux  disease) 08/30/2015   Gout 08/16/2014   History of acute myocardial infarction 06/30/2015   Overview:  [facility]    HPV (human papilloma virus) infection 07/17/2013   Hyperlipidemia    Low HDL (under 40) 08/30/2015   MI (myocardial infarction) ([place]) 2018   Parkinson disease ([place])    Pre-diabetes    Prediabetes 05/20/2016   PTSD (post-traumatic stress disorder)    history of panic attacks   PTSD (post-traumatic stress disorder)    PTSD (post-traumatic stress disorder)    Restless leg syndrome    Rhabdomyolysis    Sleep apnea 06/30/2015   Stage 3 severe COPD by GOLD classification ([place]) 11/18/2013   Stroke ([place]) 2018   Tremors of nervous system    hands   Uncomplicated asthma 08/04/4852   Varicella 02/25/2017    Patient Active Problem List   Diagnosis Date Noted   COPD with acute exacerbation ([place]) 01/01/2021   Benign neoplasm of ascending colon    Polyp of sigmoid colon    Bipolar disorder with depression ([place]) 02/25/2017   ADD (attention deficit disorder) 02/25/2017   Marijuana use 02/13/2017   Neuropathy, peripheral 05/07/2016   Vitamin B12 deficiency 10/24/2015   Fibromyalgia 08/30/2015   Low HDL (under 40) 08/30/2015   GERD (gastroesophageal reflux disease) 08/30/2015   OP (osteoporosis) 06/30/2015   Sleep apnea 06/30/2015   Morbid (severe) obesity due to excess calories ([place]) 08/16/2014  Low back pain with sciatica 08/04/2014   Current tobacco use 08/04/2014   Polysubstance abuse ([place]) 07/04/2014   Psychoactive substance abuse ([place]) 07/04/2014   Anxiety, generalized 11/24/2013   COPD with asthma ([place]) 11/18/2013   Arthritis of knee, degenerative 07/15/2013   Osteoarthritis of knee, unspecified 07/15/2013    Past Surgical History:  Procedure Laterality Date   BACK SURGERY     CATARACT EXTRACTION W/PHACO Right 01/01/2019   Procedure: CATARACT EXTRACTION PHACO AND INTRAOCULAR LENS PLACEMENT ([place]) right vision blue;  Surgeon: Marchia Meiers, MD;  Location: [facility] ORS;  Service:  Ophthalmology;  Laterality: Right;  [place] 00:39.4 CDE 5.49 Fluid Pack lot # [id number]   CATARACT EXTRACTION W/PHACO Left 01/29/2019   Procedure: CATARACT EXTRACTION PHACO AND INTRAOCULAR LENS PLACEMENT ([place]) LEFT Vision Blue;  Surgeon: Marchia Meiers, MD;  Location: [facility] ORS;  Service: Ophthalmology;  Laterality: Left;  [place] 00:51.1 CDE 4.38 Fluid Pack Lot # [id number]   COLONOSCOPY WITH PROPOFOL N/A 08/07/2017   Procedure: COLONOSCOPY WITH PROPOFOL;  Surgeon: Virgel Manifold, MD;  Location: [facility] ENDOSCOPY;  Service: Endoscopy;  Laterality: N/A;   EYE SURGERY     JOINT REPLACEMENT Left 2016   tkr   REPLACEMENT TOTAL KNEE Left 2016     OB History   No obstetric history on file.     Family History  Problem Relation Age of Onset   Hernia Mother    Heart disease Mother    OCD Mother    Diabetes Mother    Parkinson's disease Father    Bipolar disorder Sister    Schizophrenia Sister    ADD / ADHD Sister    Alcohol abuse Brother    Bipolar disorder Sister    Paranoid behavior Sister    ADD / ADHD Sister    ADD / ADHD Son    Dementia Maternal Grandmother    Emphysema Maternal Grandfather    ADD / ADHD Son    ADD / ADHD Son    Depression Son     Social History   Tobacco Use   Smoking status: Every Day    Packs/day: 1.00    Years: 36.00    Pack years: 36.00    Types: Cigarettes   Smokeless tobacco: Never  Vaping Use   Vaping Use: Former  Substance Use Topics   Alcohol use: No    Alcohol/week: 0.0 standard drinks   Drug use: No    Home Medications Prior to Admission medications   Medication Sig Start Date End Date Taking? Authorizing Provider  albuterol (VENTOLIN HFA) 108 (90 Base) MCG/ACT inhaler Inhale 2 puffs into the lungs every 4 (four) hours as needed for wheezing or shortness of breath. 02/04/20  Yes Delsa Grana, PA-C  ALPRAZolam Duanne Moron) 0.5 MG tablet Take 0.5 mg by mouth 4 (four) times daily.   Yes [provider]  atorvastatin (LIPITOR) 40 MG tablet  Take 1 tablet (40 mg total) by mouth at bedtime. 07/06/20  Yes Delsa Grana, PA-C  divalproex (DEPAKOTE) 250 MG DR tablet Take 1 tablet (250 mg total) by mouth 2 (two) times daily. 11/19/16  Yes Rainey Pines, MD  escitalopram (LEXAPRO) 20 MG tablet Take 1 tablet (20 mg total) by mouth daily. 11/19/16  Yes Rainey Pines, MD  ipratropium-albuterol (DUONEB) 0.5-2.5 (3) MG/3ML SOLN Take 3 mLs by nebulization 3 (three) times daily as needed. Patient taking differently: Take 3 mLs by nebulization 3 (three) times daily as needed (wheezing, shortness of breath). 12/07/19  Yes Delsa Grana, PA-C  LATUDA 60 MG TABS Take 60 mg by mouth at bedtime.  01/19/19  Yes [provider]  meloxicam (MOBIC) 15 MG tablet Take 15 mg by mouth daily. 04/27/20  Yes [provider]  Multiple Vitamin (MULTIVITAMIN WITH MINERALS) TABS tablet Take 1 tablet by mouth daily.   Yes [provider]  nystatin ointment (MYCOSTATIN) APPLY ONE APPLICATION AS INTRUCTED TWO TIMES DAILY. Patient taking differently: Apply 1 application topically 2 (two) times daily. 08/31/20  Yes Delsa Grana, PA-C  promethazine (PHENERGAN) 25 MG tablet Take 25 mg by mouth 2 (two) times daily as needed for nausea or vomiting.   Yes [provider]  rOPINIRole (REQUIP XL) 4 MG 24 hr tablet Take 4 mg by mouth daily. 02/23/20  Yes [provider]  Tiotropium Bromide Monohydrate (SPIRIVA RESPIMAT) 2.5 MCG/ACT AERS INHHALE TWO PUFFS INTO LUNGS DAILY Patient taking differently: Inhale 2 puffs into the lungs daily. 02/23/20  Yes Delsa Grana, PA-C  tizanidine (ZANAFLEX) 2 MG capsule Take 2-4 mg by mouth 3 (three) times daily as needed for muscle spasms.   Yes [provider]  amantadine (SYMMETREL) 100 MG capsule Take 100 mg by mouth 2 (two) times daily. Patient not taking: Reported on 01/01/2021 03/21/20   [provider]  carbidopa-levodopa (SINEMET IR) 25-100 MG tablet Take 1 tablet by mouth. 06/17/20    [provider]  nystatin-triamcinolone ointment (MYCOLOG) Apply 1 application topically 2 (two) times daily. Patient not taking: Reported on 01/01/2021 04/23/20   Ward, Delice Bison, DO    Allergies    Chantix [varenicline] and Codeine  Review of Systems   Review of Systems  Constitutional:  Positive for activity change and fatigue. Negative for appetite change, chills, diaphoresis and fever.  HENT: Negative.    Eyes: Negative.   Respiratory:  Positive for cough, chest tightness, shortness of breath and wheezing.   Cardiovascular:  Positive for leg swelling. Negative for chest pain and palpitations.  Gastrointestinal: Negative.   Genitourinary: Negative.   Musculoskeletal: Negative.   Skin: Negative.   Neurological:  Positive for weakness. Negative for syncope.   Physical Exam Updated Vital Signs BP (!) 138/102   Pulse (!) 123   Temp 97.9 F (36.6 C) (Oral)   Resp 18   LMP  (LMP Unknown)   SpO2 96%   Physical Exam Vitals and nursing note reviewed.  Constitutional:      Appearance: She is obese. She is not ill-appearing or toxic-appearing.  HENT:     Head: Normocephalic and atraumatic.     Nose: Nose normal.     Mouth/Throat:     Mouth: Mucous membranes are moist.     Pharynx: Oropharynx is clear. Uvula midline. No oropharyngeal exudate or posterior oropharyngeal erythema.     Tonsils: No tonsillar exudate.  Eyes:     General: Lids are normal. Vision grossly intact.        Right eye: No discharge.        Left eye: No discharge.     Extraocular Movements: Extraocular movements intact.     Conjunctiva/sclera: Conjunctivae normal.     Pupils: Pupils are equal, round, and reactive to light.  Neck:     Trachea: Trachea and phonation normal.  Cardiovascular:     Rate and Rhythm: Regular rhythm. Tachycardia present.     Pulses: Normal pulses.     Heart sounds: Normal heart sounds. No murmur heard. Pulmonary:     Effort: Pulmonary effort is normal. Tachypnea  present. No bradypnea,  accessory muscle usage, prolonged expiration or respiratory distress.     Breath sounds: Examination of the right-upper field reveals wheezing. Examination of the left-upper field reveals wheezing. Examination of the right-middle field reveals wheezing. Examination of the left-middle field reveals wheezing. Examination of the right-lower field reveals wheezing. Examination of the left-lower field reveals wheezing. Wheezing present. No rales.  Chest:     Chest wall: No mass, lacerations, deformity, swelling, tenderness, crepitus or edema.  Abdominal:     General: Bowel sounds are normal. There is no distension.     Palpations: Abdomen is soft.     Tenderness: There is no abdominal tenderness. There is no right CVA tenderness, left CVA tenderness, guarding or rebound.  Musculoskeletal:        General: No deformity.     Cervical back: Normal range of motion and neck supple. No edema, rigidity or crepitus. No pain with movement, spinous process tenderness or muscular tenderness.     Right lower leg: 1+ Edema present.     Left lower leg: 1+ Edema present.  Lymphadenopathy:     Cervical: No cervical adenopathy.  Skin:    General: Skin is warm and dry.     Capillary Refill: Capillary refill takes less than 2 seconds.  Neurological:     General: No focal deficit present.     Mental Status: She is alert and oriented to person, place, and time. Mental status is at baseline.     Gait: Gait is intact. Gait normal.  Psychiatric:        Mood and Affect: Mood normal.    ED Results / Procedures / Treatments   Labs (all labs ordered are listed, but only abnormal results are displayed) Labs Reviewed  BASIC METABOLIC PANEL - Abnormal; Notable for the following components:      Result Value   Chloride 97 (*)    Glucose, Bld 159 (*)    Calcium 8.5 (*)    All other components within normal limits  CBC WITH DIFFERENTIAL/PLATELET - Abnormal; Notable for the following components:    Abs Immature Granulocytes 0.08 (*)    All other components within normal limits  BLOOD GAS, VENOUS - Abnormal; Notable for the following components:   pO2, Ven 61.0 (*)    All other components within normal limits  RESP PANEL BY RT-PCR (FLU A&B, COVID) [id number]  BRAIN NATRIURETIC PEPTIDE    EKG EKG: sinus tachycardia, prolonged QT.   Radiology DG Chest 2 View  Result Date: 12/31/2020 CLINICAL DATA:  Shortness of breath. EXAM: CHEST - 2 VIEW COMPARISON:  January 10, 2018 FINDINGS: Cardiomediastinal silhouette is normal. Mediastinal contours appear intact. There is no evidence of focal airspace consolidation, pleural effusion or pneumothorax. Osseous structures are without acute abnormality. Soft tissues are grossly normal. IMPRESSION: No active cardiopulmonary disease. Electronically Signed   By: Fidela Salisbury M.D.   On: 12/31/2020 17:25   DG Chest Port 1 View  Result Date: 01/01/2021 CLINICAL DATA:  Shortness of breath. EXAM: PORTABLE CHEST 1 VIEW COMPARISON:  December 31, 2020 FINDINGS: Cardiomediastinal silhouette is normal. Mediastinal contours appear intact. There is no evidence of focal airspace consolidation, pleural effusion or pneumothorax. Osseous structures are without acute abnormality. Soft tissues are grossly normal. IMPRESSION: No active disease. Electronically Signed   By: Fidela Salisbury M.D.   On: 01/01/2021 11:28    Procedures Procedures   Medications Ordered in ED Medications  magnesium sulfate IVPB 2 g 50 mL (has no administration in time range)  lactated ringers bolus 1,000 mL (has no administration in time range)  albuterol (PROVENTIL,VENTOLIN) solution continuous neb (10 mg/hr Nebulization Given 01/01/21 1207)  albuterol (PROVENTIL) (2.5 MG/3ML) 0.083% nebulizer solution (10 mg  Given 01/01/21 1206)    ED Course  I have reviewed the triage vital signs and the nursing notes.  Pertinent labs & imaging results that were available during my care of the  patient were reviewed by me and considered in my medical decision making (see chart for details).  Clinical Course as of 01/01/21 1342  Sun Jan 01, 2021  1339 Consult to hospitalist, Dr. Olevia Bowens, was agreeable to admitting this patient to his service.  Appreciate his collaboration in the care of this patient. [RS]    Clinical Course User Index [RS] Aracelys Glade, Sharlene Dory   MDM Rules/Calculators/A&P                         60 year old female presents with aggressively worsening shortness of breath x1 week.  Not improved with albuterol and nebulizers at home.  Tachycardic and tachypneic on intake, hypoxic to the 80s on room air requiring 4 L by nasal cannula to maintain her oxygen saturation.  Cardiac exam with tachycardia with regular rhythm, pulmonary exam with wheezing throughout lung fields bilaterally and decreased air movement.  Abdominal exam is benign.  Patient with mild lower extremity edema without pitting.  Of note patient did also have bedbugs on her.  Patient administered albuterol and Solu-Medrol via EMS.  We will proceed with continuous albuterol inhaler at this time and laboratory studies.  CBC unremarkable, BMP unremarkable, COVID is negative, VBG without acidosis. Chest x-ray negative for acute cardiopulmonary disease and EKG with sinus tachycardia with prolonged QT with QTC of 517.  Patient with improvement in her wheezing and her symptoms after cat but continues to have wheezing throughout the lung fields and remains intermittently tachypneic.  We will proceed with magnesium.  Given hypoxia upon presentation to feel patient would benefit from admission to the hospital for COPD exacerbation.  Consult to hospitalist above.  Sunny voiced understanding for medical evaluation and treatment plan thus far.  Each of her questions was answered to her expressed satisfaction.  She is amenable to plan for admission at this time.  This chart was dictated using voice recognition  software, Dragon. Despite the best efforts of this provider to proofread and correct errors, errors may still occur which can change documentation meaning.   Final Clinical Impression(s) / ED Diagnoses Final diagnoses:  None    Rx / DC Orders ED Discharge Orders     None        Aura Dials 01/01/21 1342    Lacretia Leigh, MD 01/03/21 1549

## 2021-01-01 NOTE — ED Provider Notes (Signed)
I provided a substantive portion of the care of this patient.  I personally performed the entirety of the medical decision making for this encounter.     60 year old female with history of COPD and medication noncompliance here with increased shortness of breath.  Wheezing noted.  Patient will be given albuterol, Solu-Medrol and will require admission   Lacretia Leigh, MD 01/01/21 1055

## 2021-01-01 NOTE — ED Triage Notes (Signed)
Pt BIB EMS from home c/o SOB x 1 week, was seen at [facility] yesterday but left after triage, recent Covid exposure. Usually takes Albuterol, not on O2 at home. H/O COPD. On EMS arrival, pt was pale and clammy and significant SOB noted. Albuterol 20mg , Atrovent 2mg , Solumedrol 125mg  IV given by EMS.  EMS vs pta: CBG= 116 HR= 122 SPO2= 88% RA. 95% on 4LNC BP 122/90

## 2021-01-02 DIAGNOSIS — M810 Age-related osteoporosis without current pathological fracture: Secondary | ICD-10-CM | POA: Diagnosis present

## 2021-01-02 DIAGNOSIS — Z20822 Contact with and (suspected) exposure to covid-19: Secondary | ICD-10-CM | POA: Diagnosis present

## 2021-01-02 DIAGNOSIS — J9621 Acute and chronic respiratory failure with hypoxia: Secondary | ICD-10-CM | POA: Diagnosis present

## 2021-01-02 DIAGNOSIS — F419 Anxiety disorder, unspecified: Secondary | ICD-10-CM | POA: Diagnosis present

## 2021-01-02 DIAGNOSIS — I252 Old myocardial infarction: Secondary | ICD-10-CM | POA: Diagnosis not present

## 2021-01-02 DIAGNOSIS — R7303 Prediabetes: Secondary | ICD-10-CM | POA: Diagnosis present

## 2021-01-02 DIAGNOSIS — M109 Gout, unspecified: Secondary | ICD-10-CM | POA: Diagnosis present

## 2021-01-02 DIAGNOSIS — J441 Chronic obstructive pulmonary disease with (acute) exacerbation: Secondary | ICD-10-CM | POA: Diagnosis present

## 2021-01-02 DIAGNOSIS — E785 Hyperlipidemia, unspecified: Secondary | ICD-10-CM | POA: Diagnosis present

## 2021-01-02 DIAGNOSIS — E875 Hyperkalemia: Secondary | ICD-10-CM | POA: Diagnosis present

## 2021-01-02 DIAGNOSIS — G2581 Restless legs syndrome: Secondary | ICD-10-CM | POA: Diagnosis present

## 2021-01-02 DIAGNOSIS — F17210 Nicotine dependence, cigarettes, uncomplicated: Secondary | ICD-10-CM | POA: Diagnosis present

## 2021-01-02 DIAGNOSIS — G8929 Other chronic pain: Secondary | ICD-10-CM | POA: Diagnosis present

## 2021-01-02 DIAGNOSIS — F319 Bipolar disorder, unspecified: Secondary | ICD-10-CM | POA: Diagnosis present

## 2021-01-02 DIAGNOSIS — Z6841 Body Mass Index (BMI) 40.0 and over, adult: Secondary | ICD-10-CM | POA: Diagnosis not present

## 2021-01-02 DIAGNOSIS — G20 Parkinson's disease: Secondary | ICD-10-CM | POA: Diagnosis present

## 2021-01-02 DIAGNOSIS — R9431 Abnormal electrocardiogram [ECG] [EKG]: Secondary | ICD-10-CM | POA: Diagnosis present

## 2021-01-02 DIAGNOSIS — F4310 Post-traumatic stress disorder, unspecified: Secondary | ICD-10-CM | POA: Diagnosis present

## 2021-01-02 DIAGNOSIS — K219 Gastro-esophageal reflux disease without esophagitis: Secondary | ICD-10-CM | POA: Diagnosis present

## 2021-01-02 DIAGNOSIS — F909 Attention-deficit hyperactivity disorder, unspecified type: Secondary | ICD-10-CM | POA: Diagnosis present

## 2021-01-02 DIAGNOSIS — M1990 Unspecified osteoarthritis, unspecified site: Secondary | ICD-10-CM | POA: Diagnosis present

## 2021-01-02 DIAGNOSIS — G4733 Obstructive sleep apnea (adult) (pediatric): Secondary | ICD-10-CM | POA: Diagnosis present

## 2021-01-02 DIAGNOSIS — M797 Fibromyalgia: Secondary | ICD-10-CM | POA: Diagnosis present

## 2021-01-02 LAB — HIV ANTIBODY (ROUTINE TESTING W REFLEX): HIV Screen 4th Generation wRfx: NONREACTIVE

## 2021-01-02 MED ORDER — LIP MEDEX EX OINT
TOPICAL_OINTMENT | CUTANEOUS | Status: DC | PRN
  Filled 2021-01-02: qty 7

## 2021-01-02 MED ORDER — ACETAMINOPHEN 325 MG PO TABS
650.0000 mg | ORAL_TABLET | Freq: Four times a day (QID) | ORAL | Status: DC | PRN
  Administered 2021-01-02: 650 mg via ORAL
  Filled 2021-01-02: qty 2

## 2021-01-02 MED ORDER — HYDROCOD POLST-CPM POLST ER 10-8 MG/5ML PO SUER
5.0000 mL | Freq: Two times a day (BID) | ORAL | Status: DC | PRN
  Administered 2021-01-02 – 2021-01-05 (×7): 5 mL via ORAL
  Filled 2021-01-02 (×7): qty 5

## 2021-01-02 MED ORDER — IPRATROPIUM-ALBUTEROL 0.5-2.5 (3) MG/3ML IN SOLN
RESPIRATORY_TRACT | Status: AC
  Filled 2021-01-02: qty 3

## 2021-01-02 MED ORDER — BENZONATATE 100 MG PO CAPS
200.0000 mg | ORAL_CAPSULE | Freq: Three times a day (TID) | ORAL | Status: AC
  Administered 2021-01-02 – 2021-01-04 (×9): 200 mg via ORAL
  Filled 2021-01-02 (×9): qty 2

## 2021-01-02 MED ORDER — IPRATROPIUM-ALBUTEROL 0.5-2.5 (3) MG/3ML IN SOLN
3.0000 mL | RESPIRATORY_TRACT | Status: DC | PRN
  Administered 2021-01-02: 3 mL via RESPIRATORY_TRACT
  Filled 2021-01-02: qty 3

## 2021-01-02 MED ORDER — GUAIFENESIN-DM 100-10 MG/5ML PO SYRP
5.0000 mL | ORAL_SOLUTION | ORAL | Status: DC | PRN
  Administered 2021-01-02 – 2021-01-05 (×14): 5 mL via ORAL
  Filled 2021-01-02 (×14): qty 10

## 2021-01-02 MED ORDER — ACETAMINOPHEN 500 MG PO TABS
500.0000 mg | ORAL_TABLET | Freq: Four times a day (QID) | ORAL | Status: DC | PRN
  Administered 2021-01-02 – 2021-01-05 (×8): 500 mg via ORAL
  Filled 2021-01-02 (×8): qty 1

## 2021-01-02 NOTE — Plan of Care (Signed)
  Problem: Clinical Measurements: Goal: Will remain free from infection Outcome: Progressing Goal: Respiratory complications will improve Outcome: Progressing   Problem: Coping: Goal: Level of anxiety will decrease Outcome: Progressing   Problem: Pain Managment: Goal: General experience of comfort will improve Outcome: Progressing

## 2021-01-02 NOTE — Progress Notes (Signed)
Nicole Austin Kidney  XBD:[id number] DOB: [birth year] DOA: 01/01/2021 PCP: Delsa Grana, PA-C    Brief Narrative:  60 year old with a history of bipolar disorder, ADD, polysubstance abuse, HLD, fibromyalgia, morbid obesity, and sleep apnea who presented to the ED for the second time in 2 days on 11/6 with progressive dyspnea wheezing sore throat dry cough and fatigue.  In the ED she is found to have a heart rate of 121 with respirations of 16 and saturations 96% on 4 L nasal cannula.  She was wheezing.  CXR revealed no acute infiltrate.  Consultants:  None  Code Status: FULL CODE  Antimicrobials:  None  DVT prophylaxis: Lovenox  Interval history: Afebrile since admission.  Vital signs stable.  Coughing remains a significant problem.  Saturations 95% on 4 L nasal cannula.  Tells me that she previously wore oxygen at home but ran out many months ago and has not been able to have this replaced.  States that her breathing is improving since admission but that she does feel she is still short of breath beyond her baseline.  Denies chest pain  Assessment & Plan:  Acute bronchospastic COPD exacerbation Continue inhaled bronchodilators as well as systemic steroid -assess oxygen saturations with exertion prior to discharge as she likely will need resumption of home O2  Tobacco abuse Has been counseled on absolute need to discontinue tobacco use  Sleep apnea Continue usual CPAP regimen  Hyperglycemia A1c pending  HLD Continue usual dose of atorvastatin  Prolonged QTC  Bipolar disorder with depression Continue usual home medications  Obesity - Body mass index is 40.07 kg/m.   Family Communication: No family present at time of exam Status is: Inpatient   Objective: Blood pressure 136/68, pulse 74, temperature 98.2 F (36.8 C), temperature source Oral, resp. rate 20, height 5\' 1"  (1.549 m), weight 96.2 kg, SpO2 95 %.  Intake/Output Summary (Last 24 hours) at 01/02/2021  1002 Last data filed at 01/02/2021 0925 Gross per 24 hour  Intake 2862.56 ml  Output --  Net 2862.56 ml   Filed Weights   01/01/21 1819  Weight: 96.2 kg    Examination: General: No acute respiratory distress Lungs: Mild expiratory wheezing diffusely without focal crackles Cardiovascular: Regular rate and rhythm without murmur gallop or rub normal S1 and S2 Abdomen: Nontender, nondistended, soft, bowel sounds positive, no rebound, no ascites, no appreciable mass Extremities: No significant cyanosis, clubbing, or edema bilateral lower extremities  CBC: Recent Labs  Lab 12/31/20 1617 01/01/21 1030  WBC 13.9* 9.8  NEUTROABS 10.1* 7.2  HGB 12.9 12.9  HCT 38.9 39.0  MCV 93.1 93.8  PLT 234 834   Basic Metabolic Panel: Recent Labs  Lab 12/31/20 1617 01/01/21 1030  NA 135 135  K 3.6 4.0  CL 96* 97*  CO2 31 27  GLUCOSE 104* 159*  BUN 12 14  CREATININE 0.94 0.91  CALCIUM 8.5* 8.5*   GFR: Estimated Creatinine Clearance: 68.9 mL/min (by C-G formula based on SCr of 0.91 mg/dL).  HbA1C: Hemoglobin A1C  Date/Time Value Ref Range Status  08/18/2020 12:00 AM 4.0  Final    Comment:    ins.  10/21/2013 04:06 AM 5.7 4.2 - 6.3 % Final    Comment:    The American Diabetes Association recommends that a primary goal of therapy should be <7% and that physicians should reevaluate the treatment regimen in patients with HbA1c values consistently >8%.    Hgb A1c MFr Bld  Date/Time Value Ref Range Status  05/14/2019  08:42 PM 5.6 4.8 - 5.6 % Final    Comment:    (NOTE) Pre diabetes:          5.7%-6.4% Diabetes:              >6.4% Glycemic control for   <7.0% adults with diabetes   02/27/2018 03:08 PM 5.5 <5.7 % of total Hgb Final    Comment:    For the purpose of screening for the presence of diabetes: . <5.7%       Consistent with the absence of diabetes 5.7-6.4%    Consistent with increased risk for diabetes             (prediabetes) > or =6.5%  Consistent with  diabetes . This assay result is consistent with a decreased risk of diabetes. . Currently, no consensus exists regarding use of hemoglobin A1c for diagnosis of diabetes in children. . According to American Diabetes Association (ADA) guidelines, hemoglobin A1c <7.0% represents optimal control in non-pregnant diabetic patients. Different metrics may apply to specific patient populations.  Standards of Medical Care in Diabetes(ADA). Marland Kitchen     Recent Results (from the past 240 hour(s))  Resp Panel by RT-PCR (Flu A&B, Covid) Nasopharyngeal Swab     Status: None   Collection Time: 01/01/21 11:57 AM   Specimen: Nasopharyngeal Swab; Nasopharyngeal(NP) swabs in vial transport medium  Result Value Ref Range Status   SARS Coronavirus 2 by RT PCR NEGATIVE NEGATIVE Final    Comment: (NOTE) SARS-CoV-2 target nucleic acids are NOT DETECTED.  The SARS-CoV-2 RNA is generally detectable in upper respiratory specimens during the acute phase of infection. The lowest concentration of SARS-CoV-2 viral copies this assay can detect is 138 copies/mL. A negative result does not preclude SARS-Cov-2 infection and should not be used as the sole basis for treatment or other patient management decisions. A negative result may occur with  improper specimen collection/handling, submission of specimen other than nasopharyngeal swab, presence of viral mutation(s) within the areas targeted by this assay, and inadequate number of viral copies(<138 copies/mL). A negative result must be combined with clinical observations, patient history, and epidemiological information. The expected result is Negative.  Fact Sheet for Patients:  EntrepreneurPulse.com.au  Fact Sheet for Healthcare Providers:  IncredibleEmployment.be  This test is no t yet approved or cleared by the [place] FDA and  has been authorized for detection and/or diagnosis of SARS-CoV-2 by FDA under an Emergency  Use Authorization (EUA). This EUA will remain  in effect (meaning this test can be used) for the duration of the COVID-19 declaration under Section 564(b)(1) of the Act, 21 U.S.C.section 360bbb-3(b)(1), unless the authorization is terminated  or revoked sooner.       Influenza A by PCR NEGATIVE NEGATIVE Final   Influenza B by PCR NEGATIVE NEGATIVE Final    Comment: (NOTE) The Xpert Xpress SARS-CoV-2/FLU/RSV plus assay is intended as an aid in the diagnosis of influenza from Nasopharyngeal swab specimens and should not be used as a sole basis for treatment. Nasal washings and aspirates are unacceptable for Xpert Xpress SARS-CoV-2/FLU/RSV testing.  Fact Sheet for Patients: EntrepreneurPulse.com.au  Fact Sheet for Healthcare Providers: IncredibleEmployment.be  This test is not yet approved or cleared by the [place] FDA and has been authorized for detection and/or diagnosis of SARS-CoV-2 by FDA under an Emergency Use Authorization (EUA). This EUA will remain in effect (meaning this test can be used) for the duration of the COVID-19 declaration under Section 564(b)(1) of the Act, 21 U.S.C.  section 360bbb-3(b)(1), unless the authorization is terminated or revoked.  Performed at [facility], [place] [address] [place],  [address]      Scheduled Meds:  atorvastatin  40 mg Oral QHS   benzonatate  200 mg Oral TID   divalproex  250 mg Oral BID   enoxaparin (LOVENOX) injection  40 mg Subcutaneous Q24H   escitalopram  20 mg Oral Daily   ipratropium-albuterol  3 mL Nebulization QID   ipratropium-albuterol       lurasidone  60 mg Oral QHS   nicotine  21 mg Transdermal Daily   predniSONE  40 mg Oral Q breakfast   rOPINIRole  4 mg Oral Daily   Continuous Infusions:  0.45 % NaCl with KCl 20 mEq / L 125 mL/hr at 01/02/21 0436     LOS: 0 days   Cherene Altes, MD [facility] Office  [phone] Pager -  Text Page per Shea Evans  If 7PM-7AM, please contact night-coverage per Amion 01/02/2021, 10:02 AM

## 2021-01-02 NOTE — Evaluation (Addendum)
Physical Therapy Evaluation Patient Details Name: Nicole Austin MRN: [id number] DOB: 11/13/1960 Today's Date: 01/02/2021    SATURATION QUALIFICATIONS: (This note is used to comply with regulatory documentation for home oxygen)  Patient Saturations on Room Air at Rest = 94%  Patient Saturations on Room Air while Ambulating = 86-89%   History of Present Illness  60 yo female admitted with COPD exac. Hx of bipolar d/o, polysubstance abuse, obesity, fibromyalgia. pt report of Parkinson's, OSA, PTSD  Clinical Impression  On eval, pt was Supv-Min guard assist for mobility.She walked ~75 feet x 2 with RW for safety. O2 86-89% on RA, dyspnea 2/4 + coughing spell with ambulation. Will plan to follow and progress activity as tolerated. Do not anticipate any f/u PT needs after discharge, at this time.        Recommendations for follow up therapy are one component of a multi-disciplinary discharge planning process, led by the attending physician.  Recommendations may be updated based on patient status, additional functional criteria and insurance authorization.  Follow Up Recommendations No PT follow up    Assistance Recommended at Discharge    Functional Status Assessment Patient has had a recent decline in their functional status and demonstrates the ability to make significant improvements in function in a reasonable and predictable amount of time.  Equipment Recommendations  None recommended by PT    Recommendations for Other Services       Precautions / Restrictions Precautions Precautions: Fall Precaution Comments: monitor O2 Restrictions Weight Bearing Restrictions: No      Mobility  Bed Mobility               General bed mobility comments: oob in recliner    Transfers Overall transfer level: Needs assistance Equipment used: Rolling walker (2 wheels);None Transfers: Sit to/from Stand Sit to Stand: Supervision           General transfer comment: for  safety.    Ambulation/Gait Ambulation/Gait assistance: Min guard Gait Distance (Feet): 75 Feet (x2) Assistive device: Rolling walker (2 wheels) Gait Pattern/deviations: Step-through pattern;Decreased stride length       General Gait Details: walked x 2. seated rest break between walks. O2 86-89% on RA, dyspnea 2/4, coughing spell.  Stairs            Wheelchair Mobility    Modified Rankin (Stroke Patients Only)       Balance Overall balance assessment: Needs assistance         Standing balance support: Bilateral upper extremity supported Standing balance-Leahy Scale: Poor                               Pertinent Vitals/Pain Pain Assessment: No/denies pain    Home Living Family/patient expects to be discharged to:: Private residence Living Arrangements: Alone   Type of Home: Apartment Home Access: Stairs to enter Entrance Stairs-Rails: None Entrance Stairs-Number of Steps: 1 step   Home Layout: One level Home Equipment: Conservation officer, nature (2 wheels);Rollator (4 wheels);Cane - single point;Crutches;BSC/3in1      Prior Function Prior Level of Function : Independent/Modified Independent             Mobility Comments: not using any devices for ambulation currently       Hand Dominance        Extremity/Trunk Assessment   Upper Extremity Assessment Upper Extremity Assessment: Overall WFL for tasks assessed    Lower Extremity Assessment Lower Extremity Assessment: Generalized weakness  Cervical / Trunk Assessment Cervical / Trunk Assessment: Normal  Communication   Communication: No difficulties  Cognition Arousal/Alertness: Awake/alert Behavior During Therapy: WFL for tasks assessed/performed Overall Cognitive Status: Within Functional Limits for tasks assessed                                          General Comments      Exercises     Assessment/Plan    PT Assessment Patient needs continued PT  services  PT Problem List Decreased mobility;Decreased activity tolerance;Decreased balance       PT Treatment Interventions DME instruction;Gait training;Therapeutic activities;Therapeutic exercise;Patient/family education;Balance training;Functional mobility training    PT Goals (Current goals can be found in the Care Plan section)  Acute Rehab PT Goals Patient Stated Goal: to feel better and get home soon PT Goal Formulation: With patient Time For Goal Achievement: 01/16/21 Potential to Achieve Goals: Good    Frequency Min 3X/week   Barriers to discharge        Co-evaluation               AM-PAC PT "6 Clicks" Mobility  Outcome Measure Help needed turning from your back to your side while in a flat bed without using bedrails?: None Help needed moving from lying on your back to sitting on the side of a flat bed without using bedrails?: None Help needed moving to and from a bed to a chair (including a wheelchair)?: A Little Help needed standing up from a chair using your arms (e.g., wheelchair or bedside chair)?: A Little Help needed to walk in hospital room?: A Little Help needed climbing 3-5 steps with a railing? : A Little 6 Click Score: 20    End of Session   Activity Tolerance: Patient tolerated treatment well Patient left: in chair;with call bell/phone within reach   PT Visit Diagnosis: Difficulty in walking, not elsewhere classified (R26.2)    Time: 7124-5809 PT Time Calculation (min) (ACUTE ONLY): 24 min   Charges:   PT Evaluation $PT Eval Moderate Complexity: 1 Mod PT Treatments $Gait Training: 8-22 mins          Doreatha Massed, PT Acute Rehabilitation  Office: [phone] Pager: [phone]

## 2021-01-02 NOTE — Progress Notes (Signed)
RT entered the Pt's room and she was audible wheezing and coughing. RT gave the Pt a duo neb and the Pt's wheezing was a little better and Rt was able to here some air flow. RT put an order for a duo prn and RT gave the Pt a prn. Pt has coughed a lot and has significant neck wheezes. RT will continue to monitor

## 2021-01-02 NOTE — TOC Initial Note (Signed)
Transition of Care [facility]) - Initial/Assessment Note    Patient Details  Name: Nicole Austin MRN: [id number] Date of Birth: 03/29/1960  Transition of [facility]) CM/SW Contact:    Leeroy Cha, RN Phone Number: 01/02/2021, 7:57 AM  Clinical Narrative:                  60year-old female with a past medical history of bipolar disorder, anxiety, ADD, tobacco use, polysubstance abuse, hyperlipidemia, ADD, fibromyalgia, sleep apnea, osteoporosis, morbid obesity who is coming to the emergency department for the second time with in the last 2 days due to progressively worse dyspnea associated with wheezing, dry cough, sore throat and fatigue.  The patient stated that she contracted a URI 2 to 3 days before the symptoms started.  She has chills, but no fever or night sweats.  Denied hemoptysis or sputum production.  She has had decreased appetite and difficulty sleeping.  No chest pain, palpitations, diaphoresis, PND, orthopnea or pitting edema of the lower extremities.  She denied abdominal pain, nausea, vomiting, diarrhea, melena or hematochezia.  No flank pain, dysuria, frequency or hematuria.  No polyuria, polydipsia, polyphagia or blurred vision.   ED Course: Initial vital signs were temperature 97.9 F, pulse 121, respiration 16, BP 152/84 mmHg O2 sat 96% on nasal cannula oxygen at 4 LPM.  Patient received Solu-Medrol 125 mg IVP by EMS and a nebulizer treatment.  She received a continuous neb of 10 mg of albuterol in the emergency department. TOC PLAN OF CARE: Following for hhc needs and dme needs/lives alone has sister has contact. Progression following: o2 at 4l/min [place], 1/2 ns with 20Meq of kcl.  Breathing treament was required am of 110722 due to increased dough and wheezing. Expected Discharge Plan: [place] Barriers to Discharge: Continued Medical Work up   Patient Goals and CMS Choice Patient states their goals for this hospitalization and ongoing recovery are::  to go home CMS Medicare.gov Compare Post Acute Care list provided to:: Patient    Expected Discharge Plan and Services Expected Discharge Plan: [place]   Discharge Planning Services: CM Consult   Living arrangements for the past 2 months: Apartment                                      Prior Living Arrangements/Services Living arrangements for the past 2 months: Apartment Lives with:: Self Patient language and need for interpreter reviewed:: Yes              Criminal Activity/Legal Involvement Pertinent to Current Situation/Hospitalization: No - Comment as needed  Activities of Daily Living Home Assistive Devices/Equipment: Dentures (specify type), Eyeglasses, Shower chair with back, Raised toilet seat with rails ADL Screening (condition at time of admission) Patient's cognitive ability adequate to safely complete daily activities?: Yes Is the patient deaf or have difficulty hearing?: No Does the patient have difficulty seeing, even when wearing glasses/contacts?: No Does the patient have difficulty concentrating, remembering, or making decisions?: Yes Patient able to express need for assistance with ADLs?: Yes Does the patient have difficulty dressing or bathing?: No Independently performs ADLs?: Yes (appropriate for developmental age) Does the patient have difficulty walking or climbing stairs?: No Weakness of Legs: None Weakness of Arms/Hands: None  Permission Sought/Granted                  Emotional Assessment  Attitude/Demeanor/Rapport: Engaged Affect (typically observed): Calm Orientation: : Oriented to Place, Oriented to  Time, Oriented to Self, Oriented to Situation Alcohol / Substance Use: Not Applicable Psych Involvement: No (comment)  Admission diagnosis:  COPD with acute exacerbation ([place]) [J44.1] Patient Active Problem List   Diagnosis Date Noted   COPD with acute exacerbation ([place]) 01/01/2021   Hyperglycemia  01/01/2021   Prolonged QT interval 01/01/2021   Benign neoplasm of ascending colon    Polyp of sigmoid colon    Bipolar disorder with depression ([place]) 02/25/2017   ADD (attention deficit disorder) 02/25/2017   Marijuana use 02/13/2017   Neuropathy, peripheral 05/07/2016   Vitamin B12 deficiency 10/24/2015   Fibromyalgia 08/30/2015   Low HDL (under 40) 08/30/2015   GERD (gastroesophageal reflux disease) 08/30/2015   OP (osteoporosis) 06/30/2015   Sleep apnea 06/30/2015   Morbid (severe) obesity due to excess calories ([place]) 08/16/2014   Low back pain with sciatica 08/04/2014   Current tobacco use 08/04/2014   Polysubstance abuse ([place]) 07/04/2014   Psychoactive substance abuse ([place]) 07/04/2014   Anxiety, generalized 11/24/2013   COPD with asthma ([place]) 11/18/2013   Arthritis of knee, degenerative 07/15/2013   Osteoarthritis of knee, unspecified 07/15/2013   PCP:  Delsa Grana, PA-C Pharmacy:   [facility], [address] [place] [address] Phone: [phone] Fax: [facility], [place]. [place] [address] Phone: [phone] Fax: [phone]     Social Determinants of Health (SDOH) Interventions    Readmission Risk Interventions No flowsheet data found.

## 2021-01-03 LAB — COMPREHENSIVE METABOLIC PANEL
ALT: 24 U/L (ref 0–44)
AST: 27 U/L (ref 15–41)
Albumin: 2.7 g/dL — ABNORMAL LOW (ref 3.5–5.0)
Alkaline Phosphatase: 76 U/L (ref 38–126)
Anion gap: 7 (ref 5–15)
BUN: 23 mg/dL (ref 8–23)
CO2: 28 mmol/L (ref 22–32)
Calcium: 8.1 mg/dL — ABNORMAL LOW (ref 8.9–10.3)
Chloride: 94 mmol/L — ABNORMAL LOW (ref 98–111)
Creatinine, Ser: 0.8 mg/dL (ref 0.44–1.00)
GFR, Estimated: >60 mL/min (ref >60)
Glucose, Bld: 122 mg/dL — ABNORMAL HIGH (ref 70–99)
Potassium: 5.6 mmol/L — ABNORMAL HIGH (ref 3.5–5.1)
Sodium: 129 mmol/L — ABNORMAL LOW (ref 135–145)
Total Bilirubin: 0.6 mg/dL (ref 0.3–1.2)
Total Protein: 6.1 g/dL — ABNORMAL LOW (ref 6.5–8.1)

## 2021-01-03 LAB — MAGNESIUM: Magnesium: 2 mg/dL (ref 1.7–2.4)

## 2021-01-03 LAB — HEMOGLOBIN A1C
Hgb A1c MFr Bld: 5.8 % — ABNORMAL HIGH (ref 4.8–5.6)
Mean Plasma Glucose: 119.76 mg/dL

## 2021-01-03 MED ORDER — IPRATROPIUM-ALBUTEROL 0.5-2.5 (3) MG/3ML IN SOLN
3.0000 mL | RESPIRATORY_TRACT | Status: DC
Start: 2021-01-03 — End: 2021-01-03
  Administered 2021-01-03 (×3): 3 mL via RESPIRATORY_TRACT
  Filled 2021-01-03 (×3): qty 3

## 2021-01-03 MED ORDER — IPRATROPIUM-ALBUTEROL 0.5-2.5 (3) MG/3ML IN SOLN
3.0000 mL | Freq: Four times a day (QID) | RESPIRATORY_TRACT | Status: DC
Start: 2021-01-03 — End: 2021-01-04
  Administered 2021-01-03 – 2021-01-04 (×4): 3 mL via RESPIRATORY_TRACT
  Filled 2021-01-03 (×3): qty 3

## 2021-01-03 NOTE — Plan of Care (Signed)

## 2021-01-03 NOTE — Progress Notes (Addendum)
Nicole Austin  URK:[id number] DOB: 05/04/1960 DOA: 01/01/2021 PCP: Delsa Grana, PA-C    Brief Narrative:  60 year old with a history of bipolar disorder, ADD, polysubstance abuse, HLD, fibromyalgia, morbid obesity, and sleep apnea who presented to the ED for the second time in 2 days on 11/6 with progressive dyspnea wheezing sore throat dry cough and fatigue.  In the ED she is found to have a heart rate of 121 with respirations of 16 and saturations 96% on 4 L nasal cannula.  She was wheezing.  CXR revealed no acute infiltrate.  Consultants:  None  Code Status: FULL CODE  Antimicrobials:  None  DVT prophylaxis: Lovenox  Interval history: Remains afebrile.  Vital signs stable.  Saturation 94% on 2 L nasal cannula.  Slow to improve clinically.  Continues to wheeze today.  States she still feels quite short of breath, even at rest.  Denies chest pain nausea or vomiting.  Assessment & Plan:  Acute bronchospastic COPD exacerbation - chronic hypoxic resp failure w/ acute exacerbation  Continue inhaled bronchodilators as well as systemic steroid - assess oxygen saturations with exertion prior to discharge as she likely will need resumption of home O2 - no in extremis presently, but resp not yet improved enough to make safe d/c possible   Tobacco abuse Has been counseled on absolute need to discontinue tobacco use  Sleep apnea Continue usual CPAP regimen  Hyperglycemia A1c 5.8 therefore not yet indicative of diabetes but consistent with a prediabetic state -weight loss advised  HLD Continue usual dose of atorvastatin  Prolonged QTC  Bipolar disorder with depression Continue usual home medications  Obesity - Body mass index is 40.07 kg/m.   Family Communication: No family present at time of exam Status is: Inpatient   Objective: Blood pressure 122/61, pulse 72, temperature 97.7 F (36.5 C), temperature source Oral, resp. rate 20, height 5\' 1"  (1.549 m), weight 96.2  kg, SpO2 94 %.  Intake/Output Summary (Last 24 hours) at 01/03/2021 1044 Last data filed at 01/03/2021 0600 Gross per 24 hour  Intake 480 ml  Output --  Net 480 ml    Filed Weights   01/01/21 1819  Weight: 96.2 kg    Examination: General: No acute respiratory distress Lungs: Diffuse expiratory wheezing persist with no focal crackles Cardiovascular: Regular rate and rhythm without rub Abdomen: Obese, soft, BS positive, no rebound Extremities: Trace bilateral lower extremity edema without erythema  CBC: Recent Labs  Lab 12/31/20 1617 01/01/21 1030  WBC 13.9* 9.8  NEUTROABS 10.1* 7.2  HGB 12.9 12.9  HCT 38.9 39.0  MCV 93.1 93.8  PLT 234 831    Basic Metabolic Panel: Recent Labs  Lab 12/31/20 1617 01/01/21 1030 01/03/21 0336  NA 135 135 129*  K 3.6 4.0 5.6*  CL 96* 97* 94*  CO2 31 27 28   GLUCOSE 104* 159* 122*  BUN 12 14 23   CREATININE 0.94 0.91 0.80  CALCIUM 8.5* 8.5* 8.1*  MG  --   --  2.0    GFR: Estimated Creatinine Clearance: 78.3 mL/min (by C-G formula based on SCr of 0.8 mg/dL).  HbA1C: Hemoglobin A1C  Date/Time Value Ref Range Status  08/18/2020 12:00 AM 4.0  Final    Comment:    ins.  10/21/2013 04:06 AM 5.7 4.2 - 6.3 % Final    Comment:    The American Diabetes Association recommends that a primary goal of therapy should be <7% and that physicians should reevaluate the treatment regimen in patients with  HbA1c values consistently >8%.    Hgb A1c MFr Bld  Date/Time Value Ref Range Status  01/03/2021 03:36 AM 5.8 (H) 4.8 - 5.6 % Final    Comment:    (NOTE) Pre diabetes:          5.7%-6.4%  Diabetes:              >6.4%  Glycemic control for   <7.0% adults with diabetes   05/14/2019 08:42 PM 5.6 4.8 - 5.6 % Final    Comment:    (NOTE) Pre diabetes:          5.7%-6.4% Diabetes:              >6.4% Glycemic control for   <7.0% adults with diabetes     Recent Results (from the past 240 hour(s))  Resp Panel by RT-PCR (Flu A&B,  Covid) Nasopharyngeal Swab     Status: None   Collection Time: 01/01/21 11:57 AM   Specimen: Nasopharyngeal Swab; Nasopharyngeal(NP) swabs in vial transport medium  Result Value Ref Range Status   SARS Coronavirus 2 by RT PCR NEGATIVE NEGATIVE Final    Comment: (NOTE) SARS-CoV-2 target nucleic acids are NOT DETECTED.  The SARS-CoV-2 RNA is generally detectable in upper respiratory specimens during the acute phase of infection. The lowest concentration of SARS-CoV-2 viral copies this assay can detect is 138 copies/mL. A negative result does not preclude SARS-Cov-2 infection and should not be used as the sole basis for treatment or other patient management decisions. A negative result may occur with  improper specimen collection/handling, submission of specimen other than nasopharyngeal swab, presence of viral mutation(s) within the areas targeted by this assay, and inadequate number of viral copies(<138 copies/mL). A negative result must be combined with clinical observations, patient history, and epidemiological information. The expected result is Negative.  Fact Sheet for Patients:  EntrepreneurPulse.com.au  Fact Sheet for Healthcare Providers:  IncredibleEmployment.be  This test is no t yet approved or cleared by the [place] FDA and  has been authorized for detection and/or diagnosis of SARS-CoV-2 by FDA under an Emergency Use Authorization (EUA). This EUA will remain  in effect (meaning this test can be used) for the duration of the COVID-19 declaration under Section 564(b)(1) of the Act, 21 U.S.C.section 360bbb-3(b)(1), unless the authorization is terminated  or revoked sooner.       Influenza A by PCR NEGATIVE NEGATIVE Final   Influenza B by PCR NEGATIVE NEGATIVE Final    Comment: (NOTE) The Xpert Xpress SARS-CoV-2/FLU/RSV plus assay is intended as an aid in the diagnosis of influenza from Nasopharyngeal swab specimens and should  not be used as a sole basis for treatment. Nasal washings and aspirates are unacceptable for Xpert Xpress SARS-CoV-2/FLU/RSV testing.  Fact Sheet for Patients: EntrepreneurPulse.com.au  Fact Sheet for Healthcare Providers: IncredibleEmployment.be  This test is not yet approved or cleared by the [place] FDA and has been authorized for detection and/or diagnosis of SARS-CoV-2 by FDA under an Emergency Use Authorization (EUA). This EUA will remain in effect (meaning this test can be used) for the duration of the COVID-19 declaration under Section 564(b)(1) of the Act, 21 U.S.C. section 360bbb-3(b)(1), unless the authorization is terminated or revoked.  Performed at [facility], [place] [address] [place], [place] [address]      Scheduled Meds:  atorvastatin  40 mg Oral QHS   benzonatate  200 mg Oral TID   divalproex  250 mg Oral BID   enoxaparin (LOVENOX) injection  40 mg Subcutaneous Q24H   escitalopram  20 mg Oral Daily   ipratropium-albuterol  3 mL Nebulization Q4H WA   lurasidone  60 mg Oral QHS   nicotine  21 mg Transdermal Daily   predniSONE  40 mg Oral Q breakfast   rOPINIRole  4 mg Oral Daily     LOS: 1 day   Cherene Altes, MD [facility] Office  [phone] Pager - Text Page per Shea Evans  If 7PM-7AM, please contact night-coverage per Amion 01/03/2021, 10:44 AM

## 2021-01-03 NOTE — Evaluation (Signed)
Occupational Therapy Evaluation Patient Details Name: Nicole Austin MRN: [id number] DOB: 07-Aug-1960 Today's Date: 01/03/2021   History of Present Illness 60 yo female admitted with COPD exac. Hx of bipolar d/o, polysubstance abuse, obesity, fibromyalgia. pt report of Parkinson's, OSA, PTSD   Clinical Impression   Patient is a 60 year old female who was living at home independently prior level. Currently, patient is min guard for ADLs with RW while on supplemental O2 2L/min.  Patient would continue to benefit from skilled OT services at this time while admitted and after d/c to address noted deficits in order to improve overall safety and independence in ADLs.    SATURATION QUALIFICATIONS: (This note is used to comply with regulatory documentation for home oxygen)  Patient Saturations on Room Air at Rest = 88%  Patient Saturations on Room Air while Ambulating = 87%  Patient Saturations on 2 Liters of oxygen while Ambulating = 94%  Please briefly explain why patient needs home oxygen:   Patient needs supplemental O2 to maintain O2 saturation during functional mobility and ADL tasks.    Recommendations for follow up therapy are one component of a multi-disciplinary discharge planning process, led by the attending physician.  Recommendations may be updated based on patient status, additional functional criteria and insurance authorization.   Follow Up Recommendations  No OT follow up    Assistance Recommended at Discharge Intermittent Supervision/Assistance  Functional Status Assessment  Patient has had a recent decline in their functional status and demonstrates the ability to make significant improvements in function in a reasonable and predictable amount of time.  Equipment Recommendations  Tub/shower bench;Other (comment) (pulse Ox, total hip kit)    Recommendations for Other Services       Precautions / Restrictions Precautions Precautions: Fall Precaution Comments:  monitor O2 Restrictions Weight Bearing Restrictions: No      Mobility Bed Mobility               General bed mobility comments: oob in recliner    Transfers Overall transfer level: Needs assistance Equipment used: Rolling walker (2 wheels);None Transfers: Sit to/from Stand Sit to Stand: Supervision           General transfer comment: for safety.      Balance Overall balance assessment: Needs assistance Sitting-balance support: No upper extremity supported Sitting balance-Leahy Scale: Good     Standing balance support: During functional activity;Single extremity supported Standing balance-Leahy Scale: Fair                             ADL either performed or assessed with clinical judgement   ADL Overall ADL's : Needs assistance/impaired Eating/Feeding: Modified independent;Sitting   Grooming: Wash/dry face;Sitting;Modified independent   Upper Body Bathing: Set up;Sitting   Lower Body Bathing: Minimal assistance;Sitting/lateral leans Lower Body Bathing Details (indicate cue type and reason): with education on AE for LB bathing for ECT Upper Body Dressing : Set up;Sitting   Lower Body Dressing: Set up;Sit to/from stand Lower Body Dressing Details (indicate cue type and reason): with education on AE to reduce windedness with LB dressing tasks. patient demonstrated understanding with Ae to don/doff socks and simulation for pants Toilet Transfer: Supervision/safety;Rolling walker (2 wheels) Toilet Transfer Details (indicate cue type and reason): with education for O2 cord management Toileting- Clothing Manipulation and Hygiene: Sit to/from stand;Min guard       Functional mobility during ADLs: Min guard;Rolling walker (2 wheels)  Vision Baseline Vision/History: 1 Wears glasses Patient Visual Report: No change from baseline       Perception     Praxis      Pertinent Vitals/Pain Pain Assessment: No/denies pain     Hand Dominance  Right   Extremity/Trunk Assessment Upper Extremity Assessment Upper Extremity Assessment: Overall WFL for tasks assessed   Lower Extremity Assessment Lower Extremity Assessment: Defer to PT evaluation   Cervical / Trunk Assessment Cervical / Trunk Assessment: Normal   Communication Communication Communication: No difficulties   Cognition Arousal/Alertness: Awake/alert Behavior During Therapy: WFL for tasks assessed/performed Overall Cognitive Status: Within Functional Limits for tasks assessed                                       General Comments       Exercises     Shoulder Instructions      Home Living Family/patient expects to be discharged to:: Private residence Living Arrangements: Alone   Type of Home: Apartment Home Access: Stairs to enter [employer] of Steps: 1 step Entrance Stairs-Rails: None Home Layout: One level     Bathroom Shower/Tub: Teacher, early years/pre: Standard Bathroom Accessibility: Yes   Home Equipment: Conservation officer, nature (2 wheels);Rollator (4 wheels);Cane - single point;Crutches;BSC/3in1;Shower seat          Prior Functioning/Environment Prior Level of Function : Independent/Modified Independent             Mobility Comments: not using any devices for ambulation currently ADLs Comments: independent in ADLs        OT Problem List: Decreased strength;Impaired balance (sitting and/or standing);Decreased activity tolerance;Decreased safety awareness;Decreased knowledge of use of DME or AE;Decreased knowledge of precautions      OT Treatment/Interventions: Self-care/ADL training;Therapeutic exercise;Neuromuscular education;DME and/or AE instruction;Energy conservation;Therapeutic activities;Patient/family education;Balance training    OT Goals(Current goals can be found in the care plan section) Acute Rehab OT Goals Patient Stated Goal: to get back home OT Goal Formulation: With patient Time  For Goal Achievement: 01/17/21 Potential to Achieve Goals: Good  OT Frequency: Min 2X/week   Barriers to D/C:            Co-evaluation              AM-PAC OT "6 Clicks" Daily Activity     Outcome Measure Help from another person eating meals?: None Help from another person taking care of personal grooming?: None Help from another person toileting, which includes using toliet, bedpan, or urinal?: A Little Help from another person bathing (including washing, rinsing, drying)?: A Little Help from another person to put on and taking off regular upper body clothing?: None Help from another person to put on and taking off regular lower body clothing?: A Little 6 Click Score: 21   End of Session Equipment Utilized During Treatment: Rolling walker (2 wheels);Oxygen Nurse Communication: Mobility status  Activity Tolerance: Patient tolerated treatment well Patient left: in chair;with call bell/phone within reach  OT Visit Diagnosis: Unsteadiness on feet (R26.81);Muscle weakness (generalized) (M62.81)                Time: 1025-1105 OT Time Calculation (min): 40 min Charges:  OT General Charges $OT Visit: 1 Visit OT Evaluation $OT Eval Low Complexity: 1 Low OT Treatments $Self Care/Home Management : 23-37 mins  Jackelyn Poling OTR/L, MS Acute Rehabilitation Department Office# [phone] Pager# [phone]  01/03/2021, 12:23 PM

## 2021-01-04 LAB — BASIC METABOLIC PANEL
Anion gap: 4 — ABNORMAL LOW (ref 5–15)
BUN: 17 mg/dL (ref 8–23)
CO2: 33 mmol/L — ABNORMAL HIGH (ref 22–32)
Calcium: 8.6 mg/dL — ABNORMAL LOW (ref 8.9–10.3)
Chloride: 95 mmol/L — ABNORMAL LOW (ref 98–111)
Creatinine, Ser: 0.89 mg/dL (ref 0.44–1.00)
GFR, Estimated: >60 mL/min (ref >60)
Glucose, Bld: 106 mg/dL — ABNORMAL HIGH (ref 70–99)
Potassium: 5.2 mmol/L — ABNORMAL HIGH (ref 3.5–5.1)
Sodium: 132 mmol/L — ABNORMAL LOW (ref 135–145)

## 2021-01-04 LAB — POTASSIUM: Potassium: 5 mmol/L (ref 3.5–5.1)

## 2021-01-04 MED ORDER — GUAIFENESIN ER 600 MG PO TB12
600.0000 mg | ORAL_TABLET | Freq: Two times a day (BID) | ORAL | Status: DC
  Administered 2021-01-04 – 2021-01-05 (×2): 600 mg via ORAL
  Filled 2021-01-04 (×2): qty 1

## 2021-01-04 MED ORDER — BUDESONIDE 0.25 MG/2ML IN SUSP
0.2500 mg | Freq: Two times a day (BID) | RESPIRATORY_TRACT | Status: DC
  Administered 2021-01-04 – 2021-01-05 (×3): 0.25 mg via RESPIRATORY_TRACT
  Filled 2021-01-04 (×3): qty 2

## 2021-01-04 MED ORDER — IPRATROPIUM-ALBUTEROL 0.5-2.5 (3) MG/3ML IN SOLN
3.0000 mL | Freq: Three times a day (TID) | RESPIRATORY_TRACT | Status: DC
  Administered 2021-01-05: 3 mL via RESPIRATORY_TRACT
  Filled 2021-01-04: qty 3

## 2021-01-04 MED ORDER — SODIUM ZIRCONIUM CYCLOSILICATE 5 G PO PACK
5.0000 g | PACK | Freq: Once | ORAL | Status: DC
  Filled 2021-01-04: qty 1

## 2021-01-04 NOTE — Care Management Important Message (Signed)
Important Message  Patient Details IM Letter given to the Patient. Name: Nicole Austin MRN: [id number] Date of Birth: Jul 22, 1960   Medicare Important Message Given:  Yes     Kerin Salen 01/04/2021, 11:33 AM

## 2021-01-04 NOTE — TOC Progression Note (Signed)
Transition of Care [facility]) - Progression Note    Patient Details  Name: Talma Aguillard MRN: [id number] Date of Birth: [birth year]  Transition of Care [facility]) CM/SW Contact  Leeroy Cha, RN Phone Number: 01/04/2021, 1:05 PM  Clinical Narrative:    Oxygen for home ordered though [employer] [facility] at 1255   Expected Discharge Plan: [place] Barriers to Discharge: Continued Medical Work up  Expected Discharge Plan and Services Expected Discharge Plan: [place]   Discharge Planning Services: CM Consult   Living arrangements for the past 2 months: Apartment                 DME Arranged: Oxygen DME Agency: [employer] Date DME Agency Contacted: 01/04/21 Time DME Agency Contacted: [phone] Representative spoke with at DME Agency: [facility]             Social Determinants of Health ([place]) Interventions    Readmission Risk Interventions No flowsheet data found.

## 2021-01-04 NOTE — Plan of Care (Signed)
  Problem: Clinical Measurements: Goal: Diagnostic test results will improve Outcome: Progressing Goal: Respiratory complications will improve Outcome: Progressing   Problem: Coping: Goal: Level of anxiety will decrease Outcome: Progressing   

## 2021-01-04 NOTE — TOC Progression Note (Signed)
Transition of Care [facility]) - Progression Note    Patient Details  Name: Nicole Austin MRN: [id number] Date of Birth: 1960/10/02  Transition of Care [facility]) CM/SW Contact  Leeroy Cha, RN Phone Number: 01/04/2021, 7:48 AM  Clinical Narrative:    Remains afebrile.  Vital signs stable.  Saturation 94% on 2 L nasal cannula.  Slow to improve clinically.  Continues to wheeze today.  States she still feels quite short of breath, even at rest.  Denies chest pain nausea or vomiting.   Assessment & Plan:   Acute bronchospastic COPD exacerbation - chronic hypoxic resp failure w/ acute exacerbation  Continue inhaled bronchodilators as well as systemic steroid - assess oxygen saturations with exertion prior to discharge as she likely will need resumption of home O2 - no in extremis presently, but resp not yet improved enough to make safe d/c possible    Tobacco abuse Has been counseled on absolute need to discontinue tobacco use   Sleep apnea Continue usual CPAP regimen   Hyperglycemia A1c 5.8 therefore not yet indicative of diabetes but consistent with a prediabetic state -weight loss advised   HLD Continue usual dose of atorvastatin   Prolonged QTC   Bipolar disorder with depression Continue usual home medications   Obesity - Body mass index is 40.07 kg/m.  TOC PLAN OF CARE: FOLLOWING FOR TOC NEEDS AND PROGRESSION. Plan is to return to home with self care.  Expected Discharge Plan: [place] Barriers to Discharge: Continued Medical Work up  Expected Discharge Plan and Services Expected Discharge Plan: [place]   Discharge Planning Services: CM Consult   Living arrangements for the past 2 months: Apartment                                       Social Determinants of Health (SDOH) Interventions    Readmission Risk Interventions No flowsheet data found.

## 2021-01-04 NOTE — Progress Notes (Addendum)
Attempted to wean pt from 3 to 2 L/min O2 [place].  Pt tolerated poorly, with dyspnea, strong dry hacking cough, anxiety.  Increased O2 to 3 L, SpO2 90-96.  Lung sounds rhonchi, expiratory wheezes throughout.  PRN Robitussin, Xanax administered.  Cough refractory to PRNs.  Tussionex administered.  Will continue to monitor.  Angie Fava, RN

## 2021-01-04 NOTE — Progress Notes (Signed)
Pt refused CPAP qhs.  Pt states that she is unable to tolerate it.

## 2021-01-04 NOTE — Progress Notes (Signed)
Occupational Therapy Treatment Patient Details Name: Nicole Austin MRN: [id number] DOB: [birth year] Today's Date: 01/04/2021   History of present illness 60 yo female admitted with COPD exac. Hx of bipolar d/o, polysubstance abuse, obesity, fibromyalgia. pt report of Parkinson's, OSA, PTSD   OT comments  Patient demonstrated ability to stand and perform grooming task over 10 min at sink on oxygen. No significant complaints of dyspnea or fatigue. Patient able to ambulate in room without a device and with intermittent hand holds on furniture. Patient educated on energy conservation strategies and provided with handout. Patient has met goals and has no further OT needs.   Recommendations for follow up therapy are one component of a multi-disciplinary discharge planning process, led by the attending physician.  Recommendations may be updated based on patient status, additional functional criteria and insurance authorization.    Follow Up Recommendations  No OT follow up    Assistance Recommended at Discharge    Equipment Recommendations  None recommended by OT    Recommendations for Other Services      Precautions / Restrictions Precautions Precautions: Fall Precaution Comments: monitor O2 Restrictions Weight Bearing Restrictions: No       Mobility Bed Mobility Overal bed mobility: Modified Independent                  Transfers Overall transfer level: Needs assistance Equipment used: Rolling walker (2 wheels) Transfers: Sit to/from Stand Sit to Stand: Supervision           General transfer comment: supervision to ambulate in room without a device. Intermittent use of hand to steady herself as needed - she reports doing this at home.     Balance Overall balance assessment: Mild deficits observed, not formally tested         Standing balance support: During functional activity Standing balance-Leahy Scale: Fair                              ADL either performed or assessed with clinical judgement   ADL       Grooming: Independent;Wash/dry hands;Oral care;Brushing hair;Standing Grooming Details (indicate cue type and reason): 10 min 30 sec performing grooming tasks at sink. on 3.5 L [place]             Lower Body Dressing: Modified independent Lower Body Dressing Details (indicate cue type and reason): demonstrated ability to perform LB feet by using figure four method.                    Extremity/Trunk Assessment              Vision Patient Visual Report: No change from baseline     Perception     Praxis      Cognition Arousal/Alertness: Awake/alert Behavior During Therapy: WFL for tasks assessed/performed Overall Cognitive Status: Within Functional Limits for tasks assessed                                            Exercises Other Exercises Other Exercises: Educated on [facility] and provided with handout.   Shoulder Instructions       General Comments      Pertinent Vitals/ Pain       Pain Assessment: No/denies pain  Home Living  Prior Functioning/Environment              Frequency           Progress Toward Goals  OT Goals(current goals can now be found in the care plan section)  Progress towards OT goals: Goals met/education completed, patient discharged from OT     Plan All goals met and education completed, patient discharged from OT services    Co-evaluation                 AM-PAC OT "6 Clicks" Daily Activity     Outcome Measure   Help from another person eating meals?: None Help from another person taking care of personal grooming?: None Help from another person toileting, which includes using toliet, bedpan, or urinal?: None Help from another person bathing (including washing, rinsing, drying)?: None Help from another person to put on and taking off regular upper body clothing?:  None Help from another person to put on and taking off regular lower body clothing?: None 6 Click Score: 24    End of Session Equipment Utilized During Treatment: Oxygen  OT Visit Diagnosis: Unsteadiness on feet (R26.81);Muscle weakness (generalized) (M62.81)   Activity Tolerance Patient tolerated treatment well   Patient Left in bed;with call bell/phone within reach   Nurse Communication Mobility status        Time: 1610-9604 OT Time Calculation (min): 25 min  Charges: OT General Charges $OT Visit: 1 Visit OT Treatments $Self Care/Home Management : 23-37 mins  Lianni Kanaan, OTR/L Acute Care Rehab Services  Office [phone] Pager: [phone]   Kelli Churn 01/04/2021, 1:48 PM

## 2021-01-04 NOTE — Progress Notes (Signed)
Physical Therapy Treatment Patient Details Name: Nicole Austin MRN: [id number] DOB: 01-Jun-1960 Today's Date: 01/04/2021   History of Present Illness 60 yo female admitted with COPD exac. Hx of bipolar d/o, polysubstance abuse, obesity, fibromyalgia. pt report of Parkinson's, OSA, PTSD    PT Comments    Pt continues to participate well. O2 90% on 3L while ambulating.    Recommendations for follow up therapy are one component of a multi-disciplinary discharge planning process, led by the attending physician.  Recommendations may be updated based on patient status, additional functional criteria and insurance authorization.  Follow Up Recommendations  No PT follow up     Assistance Recommended at Discharge    Equipment Recommendations  None recommended by PT    Recommendations for Other Services       Precautions / Restrictions Precautions Precautions: Fall Precaution Comments: monitor O2 Restrictions Weight Bearing Restrictions: No     Mobility  Bed Mobility Overal bed mobility: Modified Independent                  Transfers Overall transfer level: Needs assistance Equipment used: Rolling walker (2 wheels) Transfers: Sit to/from Stand Sit to Stand: Supervision           General transfer comment: for safety, hand placement, proper hand placement    Ambulation/Gait Ambulation/Gait assistance: Min guard Gait Distance (Feet): 165 Feet (x2) Assistive device: Rollator (4 wheels) Gait Pattern/deviations: Step-through pattern;Decreased stride length       General Gait Details: walked x 2. seated rest break between walks. O2 90% on 3L, dyspnea 2/4   Stairs             Wheelchair Mobility    Modified Rankin (Stroke Patients Only)       Balance Overall balance assessment: Needs assistance         Standing balance support: During functional activity Standing balance-Leahy Scale: Fair                               Cognition Arousal/Alertness: Awake/alert Behavior During Therapy: WFL for tasks assessed/performed Overall Cognitive Status: Within Functional Limits for tasks assessed                                          Exercises      General Comments        Pertinent Vitals/Pain Pain Assessment: No/denies pain    Home Living                          Prior Function            PT Goals (current goals can now be found in the care plan section) Progress towards PT goals: Progressing toward goals    Frequency    Min 3X/week      PT Plan Current plan remains appropriate    Co-evaluation              AM-PAC PT "6 Clicks" Mobility   Outcome Measure  Help needed turning from your back to your side while in a flat bed without using bedrails?: None Help needed moving from lying on your back to sitting on the side of a flat bed without using bedrails?: None Help needed moving to and from a bed to a chair (including a  wheelchair)?: A Little Help needed standing up from a chair using your arms (e.g., wheelchair or bedside chair)?: A Little Help needed to walk in hospital room?: A Little Help needed climbing 3-5 steps with a railing? : A Little 6 Click Score: 20    End of Session Equipment Utilized During Treatment: Gait belt;Oxygen Activity Tolerance: Patient tolerated treatment well Patient left: in bed;with call bell/phone within reach   PT Visit Diagnosis: Difficulty in walking, not elsewhere classified (R26.2)     Time: 4174-0814 PT Time Calculation (min) (ACUTE ONLY): 23 min  Charges:  $Gait Training: 23-37 mins                         Doreatha Massed, PT Acute Rehabilitation  Office: [phone] Pager: [phone]

## 2021-01-04 NOTE — Progress Notes (Signed)
PROGRESS NOTE    Nicole Austin  EXB:[id number] DOB: 04-Mar-1960 DOA: 01/01/2021 PCP: Danelle Berry, PA-C    Brief Narrative:  Nicole Austin is a 60 year old female with past medical history significant for bipolar disorder, attention deficit disorder, hyperlipidemia, fibromyalgia, morbid obesity, OSA, tobacco use disorder who presented to [facility] ED on 11/6 with progressive shortness of breath, cough, fatigue.  Patient second ED visit in 2 days.  Reports history of oxygen dependence, has been out of oxygen for some time for unclear reason.  In the ED, HR 121, RR 16, SPO2 96% on 4 L nasal cannula.  Chest x-ray with no acute cardiopulmonary disease process.  Patient was started on neb treatments, steroids.  TRH consulted for further evaluation management of acute hypoxic respiratory failure secondary to COPD exacerbation.   Assessment & Plan:   Principal Problem:   COPD with acute exacerbation (HCC) Active Problems:   Current tobacco use   Sleep apnea   Bipolar disorder with depression (HCC)   Morbid (severe) obesity due to excess calories (HCC)   Hyperglycemia   Prolonged QT interval   Acute respiratory failure with hypoxia COPD with acute exacerbation Patient presenting to the ED for second episode in 2 days with progressive shortness of breath, cough.  Previously oxygen dependent, has been off for some years due to unclear reason.  Patient was noted to be hypoxic requiring 4 L nasal cannula on ED presentation.  Chest x-ray with no pulmonary consolidation.  Patient is afebrile without leukocytosis. --Pulmicort neb BID --DuoNeb q6h and q4h PRN wheezing --Prednisone 40 mg p.o. daily  Tobacco use disorder Patient continues to endorse tobacco abuse.  Discussed need for complete cessation. --Nicotine patch  OSA: Continue nocturnal CPAP  Hyperglycemia Hemoglobin A1c 5.8.  Discussed dietary changes  Hyperkalemia: Resolved --K 5.6>5.2>5.0 -- Continue to monitor on  telemetry  Hyperlipidemia: Atorvastatin  Bipolar disorder with depression --Lexapro 20 mg p.o. daily --Depakote 200 mg p.o. twice daily --Alprazolam 0.5 mg p.o. TID PRN anxiety --Latuda 60 mg p.o. nightly  RLS: Requip 4 mg p.o. daily  QTC prolongation: QTC on EKG 517. --Avoid QTC prolonging medications --Monitor on telemetry  Morbid obesity Body mass index is 40.07 kg/m.  Discussed with patient needs for aggressive lifestyle changes/weight loss as this complicates all facets of care.  Outpatient follow-up with PCP.  May benefit from bariatric evaluation outpatient.    DVT prophylaxis: enoxaparin (LOVENOX) injection 40 mg Start: 01/01/21 2200   Code Status: Full Code Family Communication: No family present at bedside this morning  Disposition Plan:  Level of care: Med-Surg Status is: Inpatient  Remains inpatient appropriate because: Continues require scheduled neb treatments, TOC consult for home oxygen  Consultants:  None  Procedures:  None  Antimicrobials:  None    Subjective: Patient seen examined at bedside, resting comfortably.  Continues to complain of shortness of breath and wheezing.  Continues on 3 L nasal cannula at rest.  Seen by OT yesterday and will require home O2 on discharge.  Discussed need for complete tobacco cessation when returns home, she is in agreement.  No other complaints or questions at this time.  Denies headache, no fever/chills/night sweats, no nausea/vomiting/diarrhea, no chest pain, palpitations, no abdominal pain, no weakness, no fatigue, no paresthesias.  No acute events overnight per nursing staff.  Objective: Vitals:   01/03/21 2137 01/04/21 0418 01/04/21 0845 01/04/21 1257  BP:  139/71  (!) 146/71  Pulse:  66  82  Resp: (!) 23 (!) 24  20  Temp:  97.9 F (36.6 C)  98.9 F (37.2 C)  TempSrc:  Oral  Oral  SpO2:  95% 93% 93%  Weight:      Height:        Intake/Output Summary (Last 24 hours) at 01/04/2021 1639 Last data  filed at 01/04/2021 1247 Gross per 24 hour  Intake 480 ml  Output 4 ml  Net 476 ml   Filed Weights   01/01/21 1819  Weight: 96.2 kg    Examination:  General exam: Appears calm and comfortable, appears slightly older than stated age Respiratory system: Mild mid-late expiratory wheezing bilaterally, normal Respaire effort without accessory muscle use, on 3 L nasal cannula with SPO2 95% at rest Cardiovascular system: S1 & S2 heard, RRR. No JVD, murmurs, rubs, gallops or clicks. No pedal edema. Gastrointestinal system: Abdomen is nondistended, soft and nontender. No organomegaly or masses felt. Normal bowel sounds heard. Central nervous system: Alert and oriented. No focal neurological deficits. Extremities: Symmetric 5 x 5 power. Skin: No rashes, lesions or ulcers Psychiatry: Judgement and insight appear normal. Mood & affect appropriate.     Data Reviewed: I have personally reviewed following labs and imaging studies  CBC: Recent Labs  Lab 12/31/20 1617 01/01/21 1030  WBC 13.9* 9.8  NEUTROABS 10.1* 7.2  HGB 12.9 12.9  HCT 38.9 39.0  MCV 93.1 93.8  PLT 234 251   Basic Metabolic Panel: Recent Labs  Lab 12/31/20 1617 01/01/21 1030 01/03/21 0336 01/04/21 0339 01/04/21 1336  NA 135 135 129* 132*  --   K 3.6 4.0 5.6* 5.2* 5.0  CL 96* 97* 94* 95*  --   CO2 31 27 28  33*  --   GLUCOSE 104* 159* 122* 106*  --   BUN 12 14 23 17   --   CREATININE 0.94 0.91 0.80 0.89  --   CALCIUM 8.5* 8.5* 8.1* 8.6*  --   MG  --   --  2.0  --   --    GFR: Estimated Creatinine Clearance: 70.4 mL/min (by C-G formula based on SCr of 0.89 mg/dL). Liver Function Tests: Recent Labs  Lab 01/03/21 0336  AST 27  ALT 24  ALKPHOS 76  BILITOT 0.6  PROT 6.1*  ALBUMIN 2.7*   No results for input(s): LIPASE, AMYLASE in the last 168 hours. No results for input(s): AMMONIA in the last 168 hours. Coagulation Profile: No results for input(s): INR, PROTIME in the last 168 hours. Cardiac  Enzymes: No results for input(s): CKTOTAL, CKMB, CKMBINDEX, TROPONINI in the last 168 hours. BNP (last 3 results) No results for input(s): PROBNP in the last 8760 hours. HbA1C: Recent Labs    01/03/21 0336  HGBA1C 5.8*   CBG: No results for input(s): GLUCAP in the last 168 hours. Lipid Profile: No results for input(s): CHOL, HDL, LDLCALC, TRIG, CHOLHDL, LDLDIRECT in the last 72 hours. Thyroid Function Tests: No results for input(s): TSH, T4TOTAL, FREET4, T3FREE, THYROIDAB in the last 72 hours. Anemia Panel: No results for input(s): VITAMINB12, FOLATE, FERRITIN, TIBC, IRON, RETICCTPCT in the last 72 hours. Sepsis Labs: No results for input(s): PROCALCITON, LATICACIDVEN in the last 168 hours.  Recent Results (from the past 240 hour(s))  Resp Panel by RT-PCR (Flu A&B, Covid) Nasopharyngeal Swab     Status: None   Collection Time: 01/01/21 11:57 AM   Specimen: Nasopharyngeal Swab; Nasopharyngeal(NP) swabs in vial transport medium  Result Value Ref Range Status   SARS Coronavirus 2 by RT PCR NEGATIVE NEGATIVE Final  Comment: (NOTE) SARS-CoV-2 target nucleic acids are NOT DETECTED.  The SARS-CoV-2 RNA is generally detectable in upper respiratory specimens during the acute phase of infection. The lowest concentration of SARS-CoV-2 viral copies this assay can detect is 138 copies/mL. A negative result does not preclude SARS-Cov-2 infection and should not be used as the sole basis for treatment or other patient management decisions. A negative result may occur with  improper specimen collection/handling, submission of specimen other than nasopharyngeal swab, presence of viral mutation(s) within the areas targeted by this assay, and inadequate number of viral copies(<138 copies/mL). A negative result must be combined with clinical observations, patient history, and epidemiological information. The expected result is Negative.  Fact Sheet for Patients:   BloggerCourse.com  Fact Sheet for Healthcare Providers:  SeriousBroker.it  This test is no t yet approved or cleared by the [place] FDA and  has been authorized for detection and/or diagnosis of SARS-CoV-2 by FDA under an Emergency Use Authorization (EUA). This EUA will remain  in effect (meaning this test can be used) for the duration of the COVID-19 declaration under Section 564(b)(1) of the Act, 21 U.S.C.section 360bbb-3(b)(1), unless the authorization is terminated  or revoked sooner.       Influenza A by PCR NEGATIVE NEGATIVE Final   Influenza B by PCR NEGATIVE NEGATIVE Final    Comment: (NOTE) The Xpert Xpress SARS-CoV-2/FLU/RSV plus assay is intended as an aid in the diagnosis of influenza from Nasopharyngeal swab specimens and should not be used as a sole basis for treatment. Nasal washings and aspirates are unacceptable for Xpert Xpress SARS-CoV-2/FLU/RSV testing.  Fact Sheet for Patients: BloggerCourse.com  Fact Sheet for Healthcare Providers: SeriousBroker.it  This test is not yet approved or cleared by the [place] FDA and has been authorized for detection and/or diagnosis of SARS-CoV-2 by FDA under an Emergency Use Authorization (EUA). This EUA will remain in effect (meaning this test can be used) for the duration of the COVID-19 declaration under Section 564(b)(1) of the Act, 21 U.S.C. section 360bbb-3(b)(1), unless the authorization is terminated or revoked.  Performed at [facility], [address], [place], [place] [address]          Radiology Studies: No results found.      Scheduled Meds:  atorvastatin  40 mg Oral QHS   benzonatate  200 mg Oral TID   budesonide (PULMICORT) nebulizer solution  0.25 mg Nebulization BID   divalproex  250 mg Oral BID   enoxaparin (LOVENOX) injection  40 mg Subcutaneous Q24H    escitalopram  20 mg Oral Daily   guaiFENesin  600 mg Oral BID   ipratropium-albuterol  3 mL Nebulization Q6H   lurasidone  60 mg Oral QHS   nicotine  21 mg Transdermal Daily   predniSONE  40 mg Oral Q breakfast   rOPINIRole  4 mg Oral Daily   Continuous Infusions:   LOS: 2 days    Time spent: 39 minutes spent on chart review, discussion with nursing staff, consultants, updating family and interview/physical exam; more than 50% of that time was spent in counseling and/or coordination of care.    Alvira Philips [place], DO [facility] Available via Epic secure chat 7am-7pm After these hours, please refer to coverage provider listed on [email or web address] 01/04/2021, 4:39 PM

## 2021-01-05 ENCOUNTER — Telehealth: Payer: Self-pay | Admitting: Internal Medicine

## 2021-01-05 DIAGNOSIS — E786 Lipoprotein deficiency: Secondary | ICD-10-CM

## 2021-01-05 LAB — BASIC METABOLIC PANEL
Anion gap: 7 (ref 5–15)
BUN: 15 mg/dL (ref 8–23)
CO2: 35 mmol/L — ABNORMAL HIGH (ref 22–32)
Calcium: 9.1 mg/dL (ref 8.9–10.3)
Chloride: 89 mmol/L — ABNORMAL LOW (ref 98–111)
Creatinine, Ser: 0.92 mg/dL (ref 0.44–1.00)
GFR, Estimated: >60 mL/min (ref >60)
Glucose, Bld: 102 mg/dL — ABNORMAL HIGH (ref 70–99)
Potassium: 4.5 mmol/L (ref 3.5–5.1)
Sodium: 131 mmol/L — ABNORMAL LOW (ref 135–145)

## 2021-01-05 MED ORDER — ATORVASTATIN CALCIUM 40 MG PO TABS: 40.0000 mg | ORAL_TABLET | Freq: Every day | ORAL | 2 refills | Status: DC

## 2021-01-05 MED ORDER — NICOTINE 21 MG/24HR TD PT24: 21.0000 mg | MEDICATED_PATCH | TRANSDERMAL | 0 refills | Status: DC

## 2021-01-05 MED ORDER — SPIRIVA RESPIMAT 2.5 MCG/ACT IN AERS: 2.0000 | INHALATION_SPRAY | Freq: Every day | RESPIRATORY_TRACT | 2 refills | Status: DC

## 2021-01-05 MED ORDER — NICOTINE 14 MG/24HR TD PT24: 14.0000 mg | MEDICATED_PATCH | TRANSDERMAL | 0 refills | Status: DC

## 2021-01-05 MED ORDER — GUAIFENESIN ER 600 MG PO TB12: 600.0000 mg | ORAL_TABLET | Freq: Two times a day (BID) | ORAL | 0 refills | Status: DC

## 2021-01-05 MED ORDER — HYDROCOD POLST-CPM POLST ER 10-8 MG/5ML PO SUER: 5.0000 mL | Freq: Two times a day (BID) | ORAL | 0 refills | Status: DC | PRN

## 2021-01-05 MED ORDER — LATUDA 60 MG PO TABS: 60.0000 mg | ORAL_TABLET | Freq: Every day | ORAL | 2 refills | Status: DC

## 2021-01-05 MED ORDER — PREDNISONE 10 MG PO TABS: 40.0000 mg | ORAL_TABLET | Freq: Every day | ORAL | 0 refills | Status: AC

## 2021-01-05 MED ORDER — DULERA 100-5 MCG/ACT IN AERO
2.0000 | INHALATION_SPRAY | Freq: Two times a day (BID) | RESPIRATORY_TRACT | 3 refills | Status: DC
Start: 2021-01-05 — End: 2021-11-16

## 2021-01-05 MED ORDER — MELOXICAM 15 MG PO TABS: 15.0000 mg | ORAL_TABLET | Freq: Every day | ORAL | 2 refills | Status: AC

## 2021-01-05 MED ORDER — ESCITALOPRAM OXALATE 20 MG PO TABS: 20.0000 mg | ORAL_TABLET | Freq: Every day | ORAL | 2 refills | Status: DC

## 2021-01-05 MED ORDER — NICOTINE 7 MG/24HR TD PT24: 7.0000 mg | MEDICATED_PATCH | TRANSDERMAL | 0 refills | Status: DC

## 2021-01-05 MED ORDER — IPRATROPIUM-ALBUTEROL 0.5-2.5 (3) MG/3ML IN SOLN: 3.0000 mL | Freq: Three times a day (TID) | RESPIRATORY_TRACT | 1 refills | Status: DC | PRN

## 2021-01-05 MED ORDER — NYSTATIN 100000 UNIT/GM EX OINT: 1.0000 "application " | TOPICAL_OINTMENT | Freq: Two times a day (BID) | CUTANEOUS | 0 refills | Status: DC

## 2021-01-05 MED ORDER — PROMETHAZINE HCL 25 MG PO TABS: 25.0000 mg | ORAL_TABLET | Freq: Two times a day (BID) | ORAL | 0 refills | Status: DC | PRN

## 2021-01-05 MED ORDER — TIZANIDINE HCL 4 MG PO TABS: 4.0000 mg | ORAL_TABLET | Freq: Three times a day (TID) | ORAL | 0 refills | Status: DC | PRN

## 2021-01-05 MED ORDER — DOXYCYCLINE HYCLATE 100 MG PO TABS: 100.0000 mg | ORAL_TABLET | Freq: Two times a day (BID) | ORAL | 0 refills | Status: DC

## 2021-01-05 NOTE — Progress Notes (Signed)
Physical Therapy Treatment Patient Details Name: Nicole Austin MRN: [id number] DOB: 1960/05/04 Today's Date: 01/05/2021   History of Present Illness 60 yo female admitted with COPD exac. Hx of bipolar d/o, polysubstance abuse, obesity, fibromyalgia. pt report of Parkinson's, OSA, PTSD    PT Comments    Pt very pleasant and eager to mobilize.  Pt continues to require 3L O2 [place] and provided cues for pursed lip breathing.  Pt reports she will likely d/c home today and plans to quit smoking.  Pt reports her sister has been very supportive and plans to help with encouragement and sustaining her no smoking plan.  Also emphasized pt is NOT to smoke when using oxygen especially with nasal cannula.    Recommendations for follow up therapy are one component of a multi-disciplinary discharge planning process, led by the attending physician.  Recommendations may be updated based on patient status, additional functional criteria and insurance authorization.  Follow Up Recommendations  No PT follow up     Assistance Recommended at Discharge    Equipment Recommendations  None recommended by PT    Recommendations for Other Services       Precautions / Restrictions Precautions Precautions: Fall Precaution Comments: monitor O2     Mobility  Bed Mobility Overal bed mobility: Modified Independent                  Transfers Overall transfer level: Modified independent                 General transfer comment: pt on 2.5L O2 [place] upon arrival in room    Ambulation/Gait Ambulation/Gait assistance: Supervision Gait Distance (Feet): 200 Feet Assistive device: Rolling walker (2 wheels) Gait Pattern/deviations: Step-through pattern;Decreased stride length Gait velocity: decr     General Gait Details: pt required short standing rest break midway, cues for pursed lip breathing; SPO2 down to 88% on 2L and pt with dyspnea so increased to 3L O2 [place] and SPO2 91% upon returning to  room   Stairs             Wheelchair Mobility    Modified Rankin (Stroke Patients Only)       Balance                                            Cognition Arousal/Alertness: Awake/alert Behavior During Therapy: WFL for tasks assessed/performed Overall Cognitive Status: Within Functional Limits for tasks assessed                                          Exercises      General Comments        Pertinent Vitals/Pain Pain Assessment: No/denies pain    Home Living                          Prior Function            PT Goals (current goals can now be found in the care plan section) Progress towards PT goals: Progressing toward goals    Frequency    Min 3X/week      PT Plan Current plan remains appropriate    Co-evaluation  AM-PAC PT "6 Clicks" Mobility   Outcome Measure  Help needed turning from your back to your side while in a flat bed without using bedrails?: None Help needed moving from lying on your back to sitting on the side of a flat bed without using bedrails?: None Help needed moving to and from a bed to a chair (including a wheelchair)?: None Help needed standing up from a chair using your arms (e.g., wheelchair or bedside chair)?: A Little Help needed to walk in hospital room?: A Little Help needed climbing 3-5 steps with a railing? : A Little 6 Click Score: 21    End of Session Equipment Utilized During Treatment: Oxygen Activity Tolerance: Patient tolerated treatment well Patient left: in chair;with call bell/phone within reach Nurse Communication: Mobility status PT Visit Diagnosis: Difficulty in walking, not elsewhere classified (R26.2)     Time: 6578-4696 PT Time Calculation (min) (ACUTE ONLY): 12 min  Charges:  $Gait Training: 8-22 mins                     Arlyce Dice, DPT Acute Rehabilitation Services Pager: [phone] Office: [place] 01/05/2021, 1:42 PM

## 2021-01-05 NOTE — Telephone Encounter (Signed)
Patient requested refills of lost home medications

## 2021-01-05 NOTE — Discharge Summary (Signed)
Physician Discharge Summary  Nicole Austin ZOX:[id number] DOB: 11-30-1960 DOA: 01/01/2021  PCP: Danelle Berry, PA-C  Admit date: 01/01/2021 Discharge date: 01/05/2021  Admitted From: Home Disposition: Home  Recommendations for Outpatient Follow-up:  Follow up with PCP in 1-2 weeks Continue doxycycline 100 mg p.o. twice daily to complete 7-day course Continue prednisone 40 mg p.o. daily to complete 5-day course Started on Dulera inhaler for poorly controlled COPD; continue home Teah Tropium Started on nicotine patch, tapering dose Tussionex as needed for cough Continue to encourage tobacco cessation outpatient  Home Health: No Equipment/Devices: Oxygen 2 L per nasal cannula  Discharge Condition: Stable CODE STATUS: Full code Diet recommendation: Heart healthy diet  History of present illness:  Nicole Austin is a 60 year old female with past medical history significant for bipolar disorder, attention deficit disorder, hyperlipidemia, fibromyalgia, morbid obesity, OSA, tobacco use disorder who presented to [facility] ED on 11/6 with progressive shortness of breath, cough, fatigue.  Patient second ED visit in 2 days.  Reports history of oxygen dependence, has been out of oxygen for some time for unclear reason.   In the ED, HR 121, RR 16, SPO2 96% on 4 L nasal cannula.  Chest x-ray with no acute cardiopulmonary disease process.  Patient was started on neb treatments, steroids.  TRH consulted for further evaluation management of acute hypoxic respiratory failure secondary to COPD exacerbation.  Hospital course:  Acute respiratory failure with hypoxia COPD with acute exacerbation Patient presenting to the ED for second episode in 2 days with progressive shortness of breath, cough.  Previously oxygen dependent, has been off for some years due to unclear reason.  Patient was noted to be hypoxic requiring 4 L nasal cannula on ED presentation.  Chest x-ray with no pulmonary  consolidation.  Patient is afebrile without leukocytosis.  Patient was started on duo nebs and Pulmicort nebs during hospitalization as well as oral prednisone with improvement of her symptoms.  Ambulatory O2 evaluation performed and patient required 2 L nasal cannula for ambulation, social work to arrange restart of home oxygen.  Patient was started on Dulera inhaler to take twice daily and to continue home to Tropium.  We will continue prednisone to complete 5-day course.  We will also continue doxycycline 100 mg p.o. twice daily to complete 7-day course.  Discussed with patient needs complete cessation from tobacco abuse.  Outpatient follow-up with PCP.   Tobacco use disorder Patient continues to endorse tobacco abuse.  Discussed need for complete cessation.  Continue nicotine patch with tapering dose on discharge.  Follow-up with PCP.   OSA: Continue nocturnal CPAP   Hyperglycemia Hemoglobin A1c 5.8.  Discussed dietary changes   Hyperkalemia: Resolved Resolved with IV fluid hydration.   Hyperlipidemia: Atorvastatin   Bipolar disorder with depression  Latuda 60 mg p.o. nightly, Lexapro 20 mg p.o. daily, Depakote 200 mg p.o. twice daily, Alprazolam 0.5 mg p.o. TID PRN anxiety.   RLS: Requip 4 mg p.o. daily   QTC prolongation: QTC on EKG 517. Avoid QTC prolonging medications  Morbid obesity Body mass index is 40.07 kg/m.  Discussed with patient needs for aggressive lifestyle changes/weight loss as this complicates all facets of care.  Outpatient follow-up with PCP.  May benefit from bariatric evaluation outpatient.  Discharge Diagnoses:  Principal Problem:   COPD with acute exacerbation (HCC) Active Problems:   Current tobacco use   Sleep apnea   Bipolar disorder with depression (HCC)   Morbid (severe) obesity due to excess calories (HCC)  Hyperglycemia   Prolonged QT interval    Discharge Instructions  Discharge Instructions     Call MD for:  difficulty breathing,  headache or visual disturbances   Complete by: As directed    Call MD for:  extreme fatigue   Complete by: As directed    Call MD for:  persistant dizziness or light-headedness   Complete by: As directed    Call MD for:  persistant nausea and vomiting   Complete by: As directed    Call MD for:  severe uncontrolled pain   Complete by: As directed    Call MD for:  temperature >100.4   Complete by: As directed    Diet - low sodium heart healthy   Complete by: As directed    Increase activity slowly   Complete by: As directed    No wound care   Complete by: As directed       Allergies as of 01/05/2021       Reactions   Chantix [varenicline] Nausea Only   Codeine Nausea And Vomiting        Medication List     STOP taking these medications    amantadine 100 MG capsule Commonly known as: SYMMETREL   carbidopa-levodopa 25-100 MG tablet Commonly known as: SINEMET IR       TAKE these medications    albuterol 108 (90 Base) MCG/ACT inhaler Commonly known as: VENTOLIN HFA Inhale 2 puffs into the lungs every 4 (four) hours as needed for wheezing or shortness of breath.   ALPRAZolam 0.5 MG tablet Commonly known as: XANAX Take 0.5 mg by mouth 4 (four) times daily.   atorvastatin 40 MG tablet Commonly known as: LIPITOR Take 1 tablet (40 mg total) by mouth at bedtime.   chlorpheniramine-HYDROcodone 10-8 MG/5ML Suer Commonly known as: TUSSIONEX Take 5 mLs by mouth every 12 (twelve) hours as needed (refractory cough).   divalproex 250 MG DR tablet Commonly known as: DEPAKOTE Take 1 tablet (250 mg total) by mouth 2 (two) times daily.   doxycycline 100 MG tablet Commonly known as: VIBRA-TABS Take 1 tablet (100 mg total) by mouth 2 (two) times daily for 7 days.   Dulera 100-5 MCG/ACT Aero Generic drug: mometasone-formoterol Inhale 2 puffs into the lungs in the morning and at bedtime.   escitalopram 20 MG tablet Commonly known as: LEXAPRO Take 1 tablet (20 mg  total) by mouth daily.   guaiFENesin 600 MG 12 hr tablet Commonly known as: MUCINEX Take 1 tablet (600 mg total) by mouth 2 (two) times daily for 14 days.   ipratropium-albuterol 0.5-2.5 (3) MG/3ML Soln Commonly known as: DUONEB Take 3 mLs by nebulization 3 (three) times daily as needed. What changed: reasons to take this   Latuda 60 MG Tabs Generic drug: Lurasidone HCl Take 60 mg by mouth at bedtime.   meloxicam 15 MG tablet Commonly known as: MOBIC Take 15 mg by mouth daily.   multivitamin with minerals Tabs tablet Take 1 tablet by mouth daily.   nicotine 21 mg/24hr patch Commonly known as: NICODERM CQ - dosed in mg/24 hours Place 1 patch (21 mg total) onto the skin daily for 14 days.   nicotine 14 mg/24hr patch Commonly known as: NICODERM CQ - dosed in mg/24 hours Place 1 patch (14 mg total) onto the skin daily for 14 days. Start taking on: January 20, 2021   nicotine 7 mg/24hr patch Commonly known as: NICODERM CQ - dosed in mg/24 hr Place 1 patch (7 mg  total) onto the skin daily for 21 days. Start taking on: February 04, 2021   nystatin ointment Commonly known as: MYCOSTATIN APPLY ONE APPLICATION AS INTRUCTED TWO TIMES DAILY. What changed: See the new instructions.   predniSONE 10 MG tablet Commonly known as: DELTASONE Take 4 tablets (40 mg total) by mouth daily for 2 days. Start taking on: January 06, 2021   promethazine 25 MG tablet Commonly known as: PHENERGAN Take 25 mg by mouth 2 (two) times daily as needed for nausea or vomiting.   rOPINIRole 4 MG 24 hr tablet Commonly known as: REQUIP XL Take 4 mg by mouth daily.   Spiriva Respimat 2.5 MCG/ACT Aers Generic drug: Tiotropium Bromide Monohydrate INHHALE TWO PUFFS INTO LUNGS DAILY What changed:  how much to take how to take this when to take this additional instructions   tizanidine 2 MG capsule Commonly known as: ZANAFLEX Take 2-4 mg by mouth 3 (three) times daily as needed for muscle  spasms.               Durable Medical Equipment  (From admission, onward)           Start     Ordered   01/04/21 1651  For home use only DME oxygen  Once       Comments: Eval for POC  Question Answer Comment  Length of Need Lifetime   Mode or (Route) Nasal cannula   Liters per Minute 2   Frequency Continuous (stationary and portable oxygen unit needed)   Oxygen conserving device Yes   Oxygen delivery system Gas      01/04/21 1651   01/03/21 1609  For home use only DME oxygen  Once       Question Answer Comment  Length of Need Lifetime   Mode or (Route) Nasal cannula   Liters per Minute 2   Frequency Continuous (stationary and portable oxygen unit needed)   Oxygen conserving device Yes   Oxygen delivery system Gas      01/03/21 1608            Allergies  Allergen Reactions   Chantix [Varenicline] Nausea Only   Codeine Nausea And Vomiting    Consultations: None   Procedures/Studies: DG Chest 2 View  Result Date: 12/31/2020 CLINICAL DATA:  Shortness of breath. EXAM: CHEST - 2 VIEW COMPARISON:  January 10, 2018 FINDINGS: Cardiomediastinal silhouette is normal. Mediastinal contours appear intact. There is no evidence of focal airspace consolidation, pleural effusion or pneumothorax. Osseous structures are without acute abnormality. Soft tissues are grossly normal. IMPRESSION: No active cardiopulmonary disease. Electronically Signed   By: Ted Mcalpine M.D.   On: 12/31/2020 17:25   DG Chest Port 1 View  Result Date: 01/01/2021 CLINICAL DATA:  Shortness of breath. EXAM: PORTABLE CHEST 1 VIEW COMPARISON:  December 31, 2020 FINDINGS: Cardiomediastinal silhouette is normal. Mediastinal contours appear intact. There is no evidence of focal airspace consolidation, pleural effusion or pneumothorax. Osseous structures are without acute abnormality. Soft tissues are grossly normal. IMPRESSION: No active disease. Electronically Signed   By: Ted Mcalpine  M.D.   On: 01/01/2021 11:28     Subjective: Patient seen examined bedside, resting comfortably.  Eating breakfast.  No complaints this morning.  Ready for discharge home.  Continues with intermittent cough.  Discussed once again needs for complete tobacco cessation and patient requesting nicotine patches on discharge.  No other questions or concerns at this time.  Denies headache, no fever/chills/night sweats, no nausea/vomiting/diarrhea, no chest  pain, no palpitations, no abdominal pain, no weakness, no fatigue, no congestion, no paresthesias.  No acute events overnight per nursing staff.  Discharge Exam: Vitals:   01/05/21 0428 01/05/21 0821  BP: (!) 143/79   Pulse: 60   Resp: 19   Temp: 98.7 F (37.1 C)   SpO2: 99% 93%   Vitals:   01/04/21 1257 01/04/21 2006 01/05/21 0428 01/05/21 0821  BP: (!) 146/71  (!) 143/79   Pulse: 82  60   Resp: 20  19   Temp: 98.9 F (37.2 C)  98.7 F (37.1 C)   TempSrc: Oral  Oral   SpO2: 93% 92% 99% 93%  Weight:      Height:        General: Pt is alert, awake, not in acute distress, obese Cardiovascular: RRR, S1/S2 +, no rubs, no gallops Respiratory: CTA bilaterally, no wheezing, no rhonchi, on 2 L nasal cannula with SPO2 100% Abdominal: Soft, NT, ND, bowel sounds + Extremities: no edema, no cyanosis    The results of significant diagnostics from this hospitalization (including imaging, microbiology, ancillary and laboratory) are listed below for reference.     Microbiology: Recent Results (from the past 240 hour(s))  Resp Panel by RT-PCR (Flu A&B, Covid) Nasopharyngeal Swab     Status: None   Collection Time: 01/01/21 11:57 AM   Specimen: Nasopharyngeal Swab; Nasopharyngeal(NP) swabs in vial transport medium  Result Value Ref Range Status   SARS Coronavirus 2 by RT PCR NEGATIVE NEGATIVE Final    Comment: (NOTE) SARS-CoV-2 target nucleic acids are NOT DETECTED.  The SARS-CoV-2 RNA is generally detectable in upper respiratory specimens  during the acute phase of infection. The lowest concentration of SARS-CoV-2 viral copies this assay can detect is 138 copies/mL. A negative result does not preclude SARS-Cov-2 infection and should not be used as the sole basis for treatment or other patient management decisions. A negative result may occur with  improper specimen collection/handling, submission of specimen other than nasopharyngeal swab, presence of viral mutation(s) within the areas targeted by this assay, and inadequate number of viral copies(<138 copies/mL). A negative result must be combined with clinical observations, patient history, and epidemiological information. The expected result is Negative.  Fact Sheet for Patients:  BloggerCourse.com  Fact Sheet for Healthcare Providers:  SeriousBroker.it  This test is no t yet approved or cleared by the [place] FDA and  has been authorized for detection and/or diagnosis of SARS-CoV-2 by FDA under an Emergency Use Authorization (EUA). This EUA will remain  in effect (meaning this test can be used) for the duration of the COVID-19 declaration under Section 564(b)(1) of the Act, 21 U.S.C.section 360bbb-3(b)(1), unless the authorization is terminated  or revoked sooner.       Influenza A by PCR NEGATIVE NEGATIVE Final   Influenza B by PCR NEGATIVE NEGATIVE Final    Comment: (NOTE) The Xpert Xpress SARS-CoV-2/FLU/RSV plus assay is intended as an aid in the diagnosis of influenza from Nasopharyngeal swab specimens and should not be used as a sole basis for treatment. Nasal washings and aspirates are unacceptable for Xpert Xpress SARS-CoV-2/FLU/RSV testing.  Fact Sheet for Patients: BloggerCourse.com  Fact Sheet for Healthcare Providers: SeriousBroker.it  This test is not yet approved or cleared by the [place] FDA and has been authorized for detection  and/or diagnosis of SARS-CoV-2 by FDA under an Emergency Use Authorization (EUA). This EUA will remain in effect (meaning this test can be used) for the duration of the COVID-19  declaration under Section 564(b)(1) of the Act, 21 U.S.C. section 360bbb-3(b)(1), unless the authorization is terminated or revoked.  Performed at [facility], [address] [place], [place] [address]      Labs: BNP (last 3 results) Recent Labs    01/01/21 1030  BNP 91.8   Basic Metabolic Panel: Recent Labs  Lab 12/31/20 1617 01/01/21 1030 01/03/21 0336 01/04/21 0339 01/04/21 1336 01/05/21 0332  NA 135 135 129* 132*  --  131*  K 3.6 4.0 5.6* 5.2* 5.0 4.5  CL 96* 97* 94* 95*  --  89*  CO2 31 27 28  33*  --  35*  GLUCOSE 104* 159* 122* 106*  --  102*  BUN 12 14 23 17   --  15  CREATININE 0.94 0.91 0.80 0.89  --  0.92  CALCIUM 8.5* 8.5* 8.1* 8.6*  --  9.1  MG  --   --  2.0  --   --   --    Liver Function Tests: Recent Labs  Lab 01/03/21 0336  AST 27  ALT 24  ALKPHOS 76  BILITOT 0.6  PROT 6.1*  ALBUMIN 2.7*   No results for input(s): LIPASE, AMYLASE in the last 168 hours. No results for input(s): AMMONIA in the last 168 hours. CBC: Recent Labs  Lab 12/31/20 1617 01/01/21 1030  WBC 13.9* 9.8  NEUTROABS 10.1* 7.2  HGB 12.9 12.9  HCT 38.9 39.0  MCV 93.1 93.8  PLT 234 251   Cardiac Enzymes: No results for input(s): CKTOTAL, CKMB, CKMBINDEX, TROPONINI in the last 168 hours. BNP: Invalid input(s): POCBNP CBG: No results for input(s): GLUCAP in the last 168 hours. D-Dimer No results for input(s): DDIMER in the last 72 hours. Hgb A1c Recent Labs    01/03/21 0336  HGBA1C 5.8*   Lipid Profile No results for input(s): CHOL, HDL, LDLCALC, TRIG, CHOLHDL, LDLDIRECT in the last 72 hours. Thyroid function studies No results for input(s): TSH, T4TOTAL, T3FREE, THYROIDAB in the last 72 hours.  Invalid input(s): FREET3 Anemia work up No results for input(s):  VITAMINB12, FOLATE, FERRITIN, TIBC, IRON, RETICCTPCT in the last 72 hours. Urinalysis    Component Value Date/Time   COLORURINE AMBER (A) 05/14/2019 1554   APPEARANCEUR CLOUDY (A) 05/14/2019 1554   APPEARANCEUR Clear 06/15/2014 0241   LABSPEC 1.023 05/14/2019 1554   LABSPEC 1.024 06/15/2014 0241   PHURINE 5.0 05/14/2019 1554   GLUCOSEU NEGATIVE 05/14/2019 1554   GLUCOSEU Negative 06/15/2014 0241   HGBUR NEGATIVE 05/14/2019 1554   BILIRUBINUR NEGATIVE 05/14/2019 1554   BILIRUBINUR Negative 06/15/2014 0241   KETONESUR 5 (A) 05/14/2019 1554   PROTEINUR >=300 (A) 05/14/2019 1554   NITRITE NEGATIVE 05/14/2019 1554   LEUKOCYTESUR LARGE (A) 05/14/2019 1554   LEUKOCYTESUR Negative 06/15/2014 0241   Sepsis Labs Invalid input(s): PROCALCITONIN,  WBC,  LACTICIDVEN Microbiology Recent Results (from the past 240 hour(s))  Resp Panel by RT-PCR (Flu A&B, Covid) Nasopharyngeal Swab     Status: None   Collection Time: 01/01/21 11:57 AM   Specimen: Nasopharyngeal Swab; Nasopharyngeal(NP) swabs in vial transport medium  Result Value Ref Range Status   SARS Coronavirus 2 by RT PCR NEGATIVE NEGATIVE Final    Comment: (NOTE) SARS-CoV-2 target nucleic acids are NOT DETECTED.  The SARS-CoV-2 RNA is generally detectable in upper respiratory specimens during the acute phase of infection. The lowest concentration of SARS-CoV-2 viral copies this assay can detect is 138 copies/mL. A negative result does not preclude SARS-Cov-2 infection and should not be  used as the sole basis for treatment or other patient management decisions. A negative result may occur with  improper specimen collection/handling, submission of specimen other than nasopharyngeal swab, presence of viral mutation(s) within the areas targeted by this assay, and inadequate number of viral copies(<138 copies/mL). A negative result must be combined with clinical observations, patient history, and epidemiological information. The expected  result is Negative.  Fact Sheet for Patients:  BloggerCourse.com  Fact Sheet for Healthcare Providers:  SeriousBroker.it  This test is no t yet approved or cleared by the [place] FDA and  has been authorized for detection and/or diagnosis of SARS-CoV-2 by FDA under an Emergency Use Authorization (EUA). This EUA will remain  in effect (meaning this test can be used) for the duration of the COVID-19 declaration under Section 564(b)(1) of the Act, 21 U.S.C.section 360bbb-3(b)(1), unless the authorization is terminated  or revoked sooner.       Influenza A by PCR NEGATIVE NEGATIVE Final   Influenza B by PCR NEGATIVE NEGATIVE Final    Comment: (NOTE) The Xpert Xpress SARS-CoV-2/FLU/RSV plus assay is intended as an aid in the diagnosis of influenza from Nasopharyngeal swab specimens and should not be used as a sole basis for treatment. Nasal washings and aspirates are unacceptable for Xpert Xpress SARS-CoV-2/FLU/RSV testing.  Fact Sheet for Patients: BloggerCourse.com  Fact Sheet for Healthcare Providers: SeriousBroker.it  This test is not yet approved or cleared by the [place] FDA and has been authorized for detection and/or diagnosis of SARS-CoV-2 by FDA under an Emergency Use Authorization (EUA). This EUA will remain in effect (meaning this test can be used) for the duration of the COVID-19 declaration under Section 564(b)(1) of the Act, 21 U.S.C. section 360bbb-3(b)(1), unless the authorization is terminated or revoked.  Performed at [facility], [address] [place], [place] [address]      Time coordinating discharge: Over 30 minutes  SIGNED:   Alvira Philips [place], DO  [facility] 01/05/2021, 10:24 AM

## 2021-01-09 ENCOUNTER — Telehealth: Payer: Self-pay

## 2021-01-09 NOTE — Telephone Encounter (Signed)
Transition Care Management Follow-up Telephone Call Date of discharge and from where: 01/05/21 [facility] How have you been since you were released from the hospital? Pt states she is doing okay  Any questions or concerns? No  Items Reviewed: Did the pt receive and understand the discharge instructions provided? Yes  Medications obtained and verified? Yes  Other? No  Any new allergies since your discharge? No  Dietary orders reviewed? Yes Do you have support at home? Yes   Home Care and Equipment/Supplies: Were home health services ordered? no Were any new equipment or medical supplies ordered?  Yes: oxygen What is the name of the medical supply agency? [employer] Were you able to get the supplies/equipment? yes Do you have any questions related to the use of the equipment or supplies? No  Functional Questionnaire: (I = Independent and D = Dependent) ADLs: I  Bathing/Dressing- I  Meal Prep- I  Eating- I  Maintaining continence- I  Transferring/Ambulation- I  Managing Meds- I  Follow up appointments reviewed:  PCP Hospital f/u appt confirmed? Yes  Scheduled to see Dr. Ky Barban  on 01/11/21 @ 2;40. [facility] f/u appt confirmed? No  awaiting appt with neurology Are transportation arrangements needed? No  If their condition worsens, is the pt aware to call PCP or go to the Emergency Dept.? Yes Was the patient provided with contact information for the PCP's office or ED? Yes Was to pt encouraged to call back with questions or concerns? Yes

## 2021-01-11 ENCOUNTER — Encounter: Payer: Self-pay | Admitting: Family Medicine

## 2021-01-11 ENCOUNTER — Other Ambulatory Visit: Payer: Self-pay

## 2021-01-11 ENCOUNTER — Ambulatory Visit (INDEPENDENT_AMBULATORY_CARE_PROVIDER_SITE_OTHER): Payer: Medicare Other | Admitting: Family Medicine

## 2021-01-11 VITALS — BP 102/54 | HR 96 | Temp 97.4°F | Resp 16 | Ht 61.0 in | Wt 222.3 lb

## 2021-01-11 DIAGNOSIS — Z122 Encounter for screening for malignant neoplasm of respiratory organs: Secondary | ICD-10-CM

## 2021-01-11 DIAGNOSIS — G4733 Obstructive sleep apnea (adult) (pediatric): Secondary | ICD-10-CM

## 2021-01-11 DIAGNOSIS — J441 Chronic obstructive pulmonary disease with (acute) exacerbation: Secondary | ICD-10-CM

## 2021-01-11 NOTE — Patient Instructions (Addendum)
It was great to see you!  Our plans for today:  - Keep taking your inhaler.  - finish your antibiotic. - Try weaning your oxygen to 1L over the next week.  - Come back in 2 weeks for follow up   Take care and seek immediate care sooner if you develop any concerns.   Dr. Ky Barban

## 2021-01-11 NOTE — Assessment & Plan Note (Signed)
Improved. Compliant with dulera. O2 saturations higher than desired, instructed to decrease to 1L. Follow up in 2 weeks, if continues to do well with appropriate saturations, can d/c supplemental O2. Congratulated efforts for tobacco cessation. Referred for lung cancer screening. H/o OSA but is without CPAP, referred for sleep study. F/u in 2 weeks.

## 2021-01-11 NOTE — Progress Notes (Signed)
   SUBJECTIVE:   CHIEF COMPLAINT / HPI:   HOSPITAL FOLLOW UP Hospital/facility: [facility] 11/6-11/10/22 Diagnosis: COPD exacerbation Procedures/tests:  - COVID/flu negative - CXR clear Consultants: none New medications:  - dulera - prednisone, doxycycline - restart of home O2 Discharge instructions:   Status: better - on 2L home O2 - has 1 more dose of doxycycline left - finished doxycycline - still smoking. 2 cigarettes per day now. 80 pack year history  OBJECTIVE:   BP (!) 102/54   Pulse 96   Temp (!) 97.4 F (36.3 C)   Resp 16   Ht 5\' 1"  (1.549 m)   Wt 222 lb 4.8 oz (100.8 kg)   LMP  (LMP Unknown)   SpO2 96%   BMI 42.00 kg/m   Gen: well appearing, in NAD Card: RRR Lungs: CTAB. No wheeze/rales/rhonchi. Comfortable WOB on RA Ext: WWP, no edema   ASSESSMENT/PLAN:   COPD with acute exacerbation (HCC) Improved. Compliant with dulera. O2 saturations higher than desired, instructed to decrease to 1L. Follow up in 2 weeks, if continues to do well with appropriate saturations, can d/c supplemental O2. Congratulated efforts for tobacco cessation. Referred for lung cancer screening. H/o OSA but is without CPAP, referred for sleep study. F/u in 2 weeks.      Myles Gip, DO

## 2021-01-24 ENCOUNTER — Ambulatory Visit: Payer: Medicare Other | Admitting: Family Medicine

## 2021-01-27 ENCOUNTER — Telehealth: Payer: Self-pay

## 2021-01-27 NOTE — Progress Notes (Signed)
Chronic Care Management Pharmacy Assistant   Name: Careena Degraffenreid  MRN: [id number] DOB: 1960-07-03  Reason for Encounter: COPD Disease State Call.   Recent office visits:  01/11/2021 Rory Percy DO (PCP Office)Decrease [place] Mannan to 1L, Referred for lung cancer screening, referred for sleep stud, Follow up in 2 weeks 12/20/2020 Serafina Royals FNP (PCP Office)  Start methocarbamol (ROBAXIN) 500 MG tablet; Take 1 tablet (500 mg total) by mouth 3 (three) times daily for 10 days  Recent consult visits:  No recent [facility] visits:  Medication Reconciliation was completed by comparing discharge summary, patient's EMR and Pharmacy list, and upon discussion with patient.  Admitted to the hospital on 11/06/202 due to COPD Exacerbation. Discharge date was 01/05/2021. Discharged from [place]?Medications Started at [facility] Discharge:?? -started doxycycline 100 mg p.o. twice daily to complete 7-day course, Start prednisone 40 mg p.o. daily to complete 5-day course,start Dulera inhaler, start on nicotine patch, tapering dose,start Tussionex as needed for cough  Medication Changes at Hospital Discharge: -Changed None  Medications Discontinued at Hospital Discharge: -Stopped amantadine 100 MG capsule, stop carbidopa-levodopa 25-100 MG tablet  Medications that remain the same after Hospital Discharge:??  -All other medications will remain the same.    Medications: Outpatient Encounter Medications as of 01/27/2021  Medication Sig Note   albuterol (VENTOLIN HFA) 108 (90 Base) MCG/ACT inhaler Inhale 2 puffs into the lungs every 4 (four) hours as needed for wheezing or shortness of breath.    ALPRAZolam (XANAX) 0.5 MG tablet Take 0.5 mg by mouth 4 (four) times daily.    atorvastatin (LIPITOR) 40 MG tablet Take 1 tablet (40 mg total) by mouth at bedtime.    chlorpheniramine-HYDROcodone (TUSSIONEX) 10-8 MG/5ML SUER Take 5 mLs by mouth every 12  (twelve) hours as needed (refractory cough).    divalproex (DEPAKOTE) 250 MG DR tablet Take 1 tablet (250 mg total) by mouth 2 (two) times daily.    escitalopram (LEXAPRO) 20 MG tablet Take 1 tablet (20 mg total) by mouth daily.    escitalopram (LEXAPRO) 20 MG tablet Take 1 tablet (20 mg total) by mouth daily.    ipratropium-albuterol (DUONEB) 0.5-2.5 (3) MG/3ML SOLN Take 3 mLs by nebulization 3 (three) times daily as needed. (Patient not taking: Reported on 01/11/2021)    LATUDA 60 MG TABS Take 60 mg by mouth at bedtime.     Lurasidone HCl (LATUDA) 60 MG TABS Take 1 tablet (60 mg total) by mouth daily.    meloxicam (MOBIC) 15 MG tablet Take 15 mg by mouth daily.    meloxicam (MOBIC) 15 MG tablet Take 1 tablet (15 mg total) by mouth daily.    mometasone-formoterol (DULERA) 100-5 MCG/ACT AERO Inhale 2 puffs into the lungs in the morning and at bedtime.    Multiple Vitamin (MULTIVITAMIN WITH MINERALS) TABS tablet Take 1 tablet by mouth daily. 04/20/2020: Centrum Silver 50+ Womens    nicotine (NICODERM CQ - DOSED IN MG/24 HOURS) 14 mg/24hr patch Place 1 patch (14 mg total) onto the skin daily for 14 days.    [START ON 02/04/2021] nicotine (NICODERM CQ - DOSED IN MG/24 HR) 7 mg/24hr patch Place 1 patch (7 mg total) onto the skin daily for 21 days.    nystatin ointment (MYCOSTATIN) APPLY ONE APPLICATION AS INTRUCTED TWO TIMES DAILY. (Patient taking differently: Apply 1 application topically 2 (two) times daily.)    nystatin ointment (MYCOSTATIN) Apply 1 application topically 2 (two) times daily.  promethazine (PHENERGAN) 25 MG tablet Take 25 mg by mouth 2 (two) times daily as needed for nausea or vomiting.    promethazine (PHENERGAN) 25 MG tablet Take 1 tablet (25 mg total) by mouth every 12 (twelve) hours as needed for nausea or vomiting.    rOPINIRole (REQUIP XL) 4 MG 24 hr tablet Take 4 mg by mouth daily.    Tiotropium Bromide Monohydrate (SPIRIVA RESPIMAT) 2.5 MCG/ACT AERS INHHALE TWO PUFFS INTO  LUNGS DAILY (Patient taking differently: Inhale 2 puffs into the lungs daily.)    Tiotropium Bromide Monohydrate (SPIRIVA RESPIMAT) 2.5 MCG/ACT AERS Inhale 2 puffs into the lungs daily.    tizanidine (ZANAFLEX) 2 MG capsule Take 2-4 mg by mouth 3 (three) times daily as needed for muscle spasms.    tiZANidine (ZANAFLEX) 4 MG tablet Take 1 tablet (4 mg total) by mouth 3 (three) times daily as needed for muscle spasms.    No facility-administered encounter medications on file as of 01/27/2021.   Care Gaps: Pneumococcal Vaccine (Last Completed 06/21/2015) COVID-19 Vaccine (4- Booster Moderna Series)  Star Rating Drugs: Atorvastatin 40 mg last filled 12/24/2020 90 day supply at [facility].  Medication Fill Gaps: None ID  Current COPD regimen:  Ventolin HFA 108 mcg/act 2 puff every 4 hours as needed  Duoneb 18mL nebulizer three times daily as needed  Spiriva 2.5 mcg/act 2 puffs daily  Dulera 100-5 MCG/ACT inhale 2 puffs into lungs in the morning and bedtime No flowsheet data found. Any recent hospitalizations or ED visits since last visit with CPP? Yes  What recent interventions/DTPs have been made by any provider to improve breathing since last visit: ED 01/01/2021 started Dulera 100-5 MCG/ACT inhale 2 puffs into lungs in the morning and bedtime  I have attempted without success to contact this patient by phone three times to do her COPD Disease State call. Mailbox Full 12/05,12/06,12/12  Adherence Review: Does the patient have >5 day gap between last estimated fill date for maintenance inhaler medications? No  Anderson [place] Clinical Production designer, theatre/television/film [phone]

## 2021-02-09 ENCOUNTER — Telehealth: Payer: Self-pay

## 2021-02-09 ENCOUNTER — Telehealth (INDEPENDENT_AMBULATORY_CARE_PROVIDER_SITE_OTHER): Payer: Medicare Other | Admitting: Internal Medicine

## 2021-02-09 DIAGNOSIS — J449 Chronic obstructive pulmonary disease, unspecified: Secondary | ICD-10-CM | POA: Diagnosis not present

## 2021-02-09 DIAGNOSIS — R251 Tremor, unspecified: Secondary | ICD-10-CM | POA: Diagnosis not present

## 2021-02-09 DIAGNOSIS — Z720 Tobacco use: Secondary | ICD-10-CM

## 2021-02-09 DIAGNOSIS — Z9981 Dependence on supplemental oxygen: Secondary | ICD-10-CM | POA: Diagnosis not present

## 2021-02-09 MED ORDER — NICOTINE 21 MG/24HR TD PT24: 21.0000 mg | MEDICATED_PATCH | Freq: Every day | TRANSDERMAL | 0 refills | Status: DC

## 2021-02-09 NOTE — Telephone Encounter (Signed)
Mb full but patient need a Return in about 4 weeks (around 03/09/2021) for in person for copd.

## 2021-02-09 NOTE — Patient Instructions (Signed)
It was great seeing you today!  Plan discussed at today's visit: -Blood work ordered today, results will be uploaded to MyChart.   Follow up in:  Take care and let us know if you have any questions or concerns prior to your next visit.  Dr. Wynn Alldredge  

## 2021-02-09 NOTE — Progress Notes (Signed)
Virtual Visit via Telephone Note  I connected with Nicole Austin on 02/09/21 at  2:20 PM EST by telephone and verified that I am speaking with the correct person using two identifiers.  Location: Patient: Home Provider: [facility]   I discussed the limitations, risks, security and privacy concerns of performing an evaluation and management service by telephone and the availability of in person appointments. I also discussed with the patient that there may be a patient responsible charge related to this service. The patient expressed understanding and agreed to proceed.   History of Present Illness:  Nicole Austin is a 60 year old female presenting over the phone for follow up on COPD and oxygen use. She had been admitted to the hospital in November for COPD exacerbation. At that time, she was discharged with [facility] ([facility]), Prednisone and Doxycycline. She was also told to restart her home supplemental oxygen. She was seen in the office for hospital follow up, at that time she was still on oxygen but weaning down. A low dose CT for lung cancer screening was ordered as well as a sleep study.   COPD: -COPD status: better -Current medications: Dulera and Spiriva everyday, using Duonebs twice a day along with Albuterol once a day  -Satisfied with current treatment?: yes -Oxygen use: yes - 1 L at night but not during sleep, none with activity  -Dyspnea frequency: everyday  -Cough frequency: normal smokers cough, no change in volume  -Rescue inhaler frequency:  see above -Limitation of activity: yes -Productive cough: no -Last Spirometry/PFTs:  -Pneumovax:  Had 23, due for 20 -Influenza: Up to Date -Currently smoking 0.5 ppd for 40+ years, cannot tolerate Chantix   Tremors: had for 2 years, seen by Neurology who initially thought the whole body tremors were due to Parkinson's, however they did not respond to treatment. Reviewed note, thinking the tremors are multifactorial and/or due to polypharmacy.  Patient would like a second opinion.   Observations/Objective:  General: no acute distress Neuro: answers questions appropriately  Assessment and Plan:  1. Functional tremor: Patient requesting second opinion, referral ordered.   - Ambulatory referral to Neurology  2. COPD with asthma (HCC)/Current tobacco use/ Requires supplemental oxygen: Discussed tobacco cessation, higher dose nicotine patches sent to pharmacy. COPD better but not completely stable, continue inhalers and follow up in 1 month for in person exam, may require repeat PFTs and pulmonology referral.   - nicotine (NICODERM CQ - DOSED IN MG/24 HOURS) 21 mg/24hr patch; Place 1 patch (21 mg total) onto the skin daily.  Dispense: 45 patch; Refill: 0  Follow Up Instructions: 1 month in person    I discussed the assessment and treatment plan with the patient. The patient was provided an opportunity to ask questions and all were answered. The patient agreed with the plan and demonstrated an understanding of the instructions.   The patient was advised to call back or seek an in-person evaluation if the symptoms worsen or if the condition fails to improve as anticipated.  I provided 34 minutes of non-face-to-face time during this encounter.   [name], DO

## 2021-02-26 DIAGNOSIS — A4902 Methicillin resistant Staphylococcus aureus infection, unspecified site: Secondary | ICD-10-CM

## 2021-02-26 HISTORY — DX: Methicillin resistant Staphylococcus aureus infection, unspecified site: A49.02

## 2021-03-10 ENCOUNTER — Encounter: Payer: Self-pay | Admitting: Nurse Practitioner

## 2021-03-10 ENCOUNTER — Other Ambulatory Visit: Payer: Self-pay

## 2021-03-10 ENCOUNTER — Ambulatory Visit: Payer: Medicare Other | Admitting: Internal Medicine

## 2021-03-10 ENCOUNTER — Telehealth (INDEPENDENT_AMBULATORY_CARE_PROVIDER_SITE_OTHER): Payer: 59 | Admitting: Nurse Practitioner

## 2021-03-10 DIAGNOSIS — J069 Acute upper respiratory infection, unspecified: Secondary | ICD-10-CM

## 2021-03-10 DIAGNOSIS — J441 Chronic obstructive pulmonary disease with (acute) exacerbation: Secondary | ICD-10-CM

## 2021-03-10 MED ORDER — AMOXICILLIN-POT CLAVULANATE 875-125 MG PO TABS: 1.0000 | ORAL_TABLET | Freq: Two times a day (BID) | ORAL | 0 refills | Status: AC

## 2021-03-10 MED ORDER — BENZONATATE 100 MG PO CAPS: 200.0000 mg | ORAL_CAPSULE | Freq: Two times a day (BID) | ORAL | 0 refills | Status: DC | PRN

## 2021-03-10 MED ORDER — PREDNISONE 10 MG PO TABS: ORAL_TABLET | ORAL | 0 refills | Status: DC

## 2021-03-10 NOTE — Addendum Note (Signed)
Addended by: Serafina Royals F on: 03/10/2021 12:09 PM   Modules accepted: Orders

## 2021-03-10 NOTE — Progress Notes (Addendum)
Name: Nicole Austin   MRN: [id number]    DOB: 1961-01-17   Date:03/10/2021       Progress Note  Subjective  Chief Complaint  Chief Complaint  Patient presents with   URI    Cough, sore throat, runny nose since yesterday    I connected with  Nicole Austin  on 03/10/21 at 11:40 am by a telephone enabled telemedicine application and verified that I am speaking with the correct person using two identifiers.  I discussed the limitations of evaluation and management by telemedicine and the availability of in person appointments. The patient expressed understanding and agreed to proceed with a virtual visit  Staff also discussed with the patient that there may be a patient responsible charge related to this service. Patient Location: home Provider Location: [facility] Additional Individuals present: alone  HPI  URI: She says her symptoms started yesterday.  She says she has a productive cough, sore throat, and nasal congestion. She is unsure if she has a fever because she cannot find her thermometer but says she feels like she has a fever. She does say she feels short of breath.  She is speaking in complete sentences and does not sound like she is in distress.  She says she does not have a pulse ox at home to check her oxygen level.  Discussed that if her shortness of breath gets worse she will need to seek emergency care.  She says she has been around her sister who is now sick.  She says she has been taking tylenol and using cough drops for symptoms. Discussed Flu and Covid testing and she says she does not have a ride and cannot do that.  Discussed OTC treatment for symptoms.  Will send in course of steroids and cough medication.   COPD exacerbation:  She says she is using her albuterol as needed which has been once today and she has been using her Dulera two times a day.  She says she is short of breath but she says it is not that bad.  She does not have access to a pulse ox meter. She  says when she has a coughing fit she feels winded. She does say that she has more mucous production. Will send in steroid pack, antibiotics and cough medication.    Patient Active Problem List   Diagnosis Date Noted   COPD with acute exacerbation ([place]) 01/01/2021   Hyperglycemia 01/01/2021   Prolonged QT interval 01/01/2021   Benign neoplasm of ascending colon    Polyp of sigmoid colon    Bipolar disorder with depression ([place]) 02/25/2017   ADD (attention deficit disorder) 02/25/2017   Marijuana use 02/13/2017   Neuropathy, peripheral 05/07/2016   Vitamin B12 deficiency 10/24/2015   Fibromyalgia 08/30/2015   Low HDL (under 40) 08/30/2015   GERD (gastroesophageal reflux disease) 08/30/2015   OP (osteoporosis) 06/30/2015   Sleep apnea 06/30/2015   Morbid (severe) obesity due to excess calories ([place]) 08/16/2014   Low back pain with sciatica 08/04/2014   Current tobacco use 08/04/2014   Polysubstance abuse ([place]) 07/04/2014   Psychoactive substance abuse ([place]) 07/04/2014   Anxiety, generalized 11/24/2013   COPD with asthma ([place]) 11/18/2013   Arthritis of knee, degenerative 07/15/2013   Osteoarthritis of knee, unspecified 07/15/2013    Social History   Tobacco Use   Smoking status: Every Day    Packs/day: 1.00    Years: 36.00    Pack years: 36.00    Types: Cigarettes  Smokeless tobacco: Never  Substance Use Topics   Alcohol use: No    Alcohol/week: 0.0 standard drinks    Current Outpatient Medications:    albuterol (VENTOLIN HFA) 108 (90 Base) MCG/ACT inhaler, Inhale 2 puffs into the lungs every 4 (four) hours as needed for wheezing or shortness of breath., Disp: 18 g, Rfl: 2   ALPRAZolam (XANAX) 0.5 MG tablet, Take 0.5 mg by mouth 4 (four) times daily., Disp: , Rfl:    atorvastatin (LIPITOR) 40 MG tablet, Take 1 tablet (40 mg total) by mouth at bedtime., Disp: 30 tablet, Rfl: 2   chlorpheniramine-HYDROcodone (TUSSIONEX) 10-8 MG/5ML SUER, Take 5 mLs by mouth every 12  (twelve) hours as needed (refractory cough)., Disp: 140 mL, Rfl: 0   divalproex (DEPAKOTE) 250 MG DR tablet, Take 1 tablet (250 mg total) by mouth 2 (two) times daily., Disp: 60 tablet, Rfl: 2   escitalopram (LEXAPRO) 20 MG tablet, Take 1 tablet (20 mg total) by mouth daily., Disp: 30 tablet, Rfl: 2   escitalopram (LEXAPRO) 20 MG tablet, Take 1 tablet (20 mg total) by mouth daily., Disp: 30 tablet, Rfl: 2   ipratropium-albuterol (DUONEB) 0.5-2.5 (3) MG/3ML SOLN, Take 3 mLs by nebulization 3 (three) times daily as needed., Disp: 180 mL, Rfl: 1   LATUDA 60 MG TABS, Take 60 mg by mouth at bedtime. , Disp: , Rfl:    Lurasidone HCl (LATUDA) 60 MG TABS, Take 1 tablet (60 mg total) by mouth daily., Disp: 30 tablet, Rfl: 2   meloxicam (MOBIC) 15 MG tablet, Take 15 mg by mouth daily., Disp: , Rfl:    meloxicam (MOBIC) 15 MG tablet, Take 1 tablet (15 mg total) by mouth daily., Disp: 30 tablet, Rfl: 2   mometasone-formoterol (DULERA) 100-5 MCG/ACT AERO, Inhale 2 puffs into the lungs in the morning and at bedtime., Disp: 1 each, Rfl: 3   Multiple Vitamin (MULTIVITAMIN WITH MINERALS) TABS tablet, Take 1 tablet by mouth daily., Disp: , Rfl:    nicotine (NICODERM CQ - DOSED IN MG/24 HOURS) 21 mg/24hr patch, Place 1 patch (21 mg total) onto the skin daily., Disp: 45 patch, Rfl: 0   nystatin ointment (MYCOSTATIN), APPLY ONE APPLICATION AS INTRUCTED TWO TIMES DAILY. (Patient taking differently: Apply 1 application topically 2 (two) times daily.), Disp: 30 g, Rfl: 1   nystatin ointment (MYCOSTATIN), Apply 1 application topically 2 (two) times daily., Disp: 30 g, Rfl: 0   promethazine (PHENERGAN) 25 MG tablet, Take 25 mg by mouth 2 (two) times daily as needed for nausea or vomiting., Disp: , Rfl:    promethazine (PHENERGAN) 25 MG tablet, Take 1 tablet (25 mg total) by mouth every 12 (twelve) hours as needed for nausea or vomiting., Disp: 30 tablet, Rfl: 0   rOPINIRole (REQUIP XL) 4 MG 24 hr tablet, Take 4 mg by mouth  daily., Disp: , Rfl:    Tiotropium Bromide Monohydrate (SPIRIVA RESPIMAT) 2.5 MCG/ACT AERS, INHHALE TWO PUFFS INTO LUNGS DAILY (Patient taking differently: Inhale 2 puffs into the lungs daily.), Disp: 4 g, Rfl: 2   Tiotropium Bromide Monohydrate (SPIRIVA RESPIMAT) 2.5 MCG/ACT AERS, Inhale 2 puffs into the lungs daily., Disp: 4 g, Rfl: 2   tizanidine (ZANAFLEX) 2 MG capsule, Take 2-4 mg by mouth 3 (three) times daily as needed for muscle spasms., Disp: , Rfl:    tiZANidine (ZANAFLEX) 4 MG tablet, Take 1 tablet (4 mg total) by mouth 3 (three) times daily as needed for muscle spasms., Disp: 30 tablet, Rfl: 0  Allergies  Allergen Reactions   Chantix [Varenicline] Nausea Only   Codeine Nausea And Vomiting    I personally reviewed active problem list, medication list, allergies, notes from last encounter with the patient/caregiver today.  ROS  Constitutional: Negative for fever or weight change.  HEENT positive sore throat, nasal congestion Respiratory: Positive for cough and shortness of breath.   Cardiovascular: Negative for chest pain or palpitations.  Gastrointestinal: Negative for abdominal pain, no bowel changes.  Musculoskeletal: Negative for gait problem or joint swelling.  Skin: Negative for rash.  Neurological: Negative for dizziness or headache.  No other specific complaints in a complete review of systems (except as listed in HPI above).   Objective  Virtual encounter, vitals not obtained.  There is no height or weight on file to calculate BMI.  Nursing Note and Vital Signs reviewed.  Physical Exam  Awake, alert and oriented, speaking in complete sentences  No results found for this or any previous visit (from the past 72 hour(s)).  Assessment & Plan  1. Viral upper respiratory tract infection -discussed OTC treatments for symptoms -gargle with salt water -drink hot tea with honey - benzonatate (TESSALON) 100 MG capsule; Take 2 capsules (200 mg total) by mouth 2  (two) times daily as needed for cough.  Dispense: 20 capsule; Refill: 0 - predniSONE (DELTASONE) 10 MG tablet; Day 1 take 6 pills, day 2 take 5 pills, day 3 take 4 pills, day 4 take 3 pills, day 5 take 2 pills, day 6 take 1 pill  Dispense: 21 tablet; Refill: 0  2. COPD with acute exacerbation ([place]) -continue to take your usual medications - benzonatate (TESSALON) 100 MG capsule; Take 2 capsules (200 mg total) by mouth 2 (two) times daily as needed for cough.  Dispense: 20 capsule; Refill: 0 - predniSONE (DELTASONE) 10 MG tablet; Day 1 take 6 pills, day 2 take 5 pills, day 3 take 4 pills, day 4 take 3 pills, day 5 take 2 pills, day 6 take 1 pill  Dispense: 21 tablet; Refill: 0  - amoxicillin-clavulanate (AUGMENTIN) 875-125 MG tablet; Take 1 tablet by mouth 2 (two) times daily for 5 days.  Dispense: 10 tablet; Refill: 0   -Red flags and when to present for emergency care or RTC including fever >101.35F, chest pain, shortness of breath, new/worsening/un-resolving symptoms,  reviewed with patient at time of visit. Follow up and care instructions discussed and provided in AVS. - I discussed the assessment and treatment plan with the patient. The patient was provided an opportunity to ask questions and all were answered. The patient agreed with the plan and demonstrated an understanding of the instructions.  I provided 20 minutes of non-face-to-face time during this encounter.  Bo Merino, FNP

## 2021-03-13 ENCOUNTER — Other Ambulatory Visit: Payer: Self-pay | Admitting: Family Medicine

## 2021-03-13 NOTE — Telephone Encounter (Signed)
Requested medication (s) are due for refill today: yes  Requested medication (s) are on the active medication list: yes  Last refill:  01/05/21  Future visit scheduled: no  Notes to clinic:  Unable to refill per protocol, medication not assigned to the refill protocol.      Requested Prescriptions  Pending Prescriptions Disp Refills   nystatin ointment (MYCOSTATIN) [Pharmacy Med Name: NYSTATIN 100000 UNIT/GM TOP OINT GM] 30 g     Sig: APPLY ONE APPLICATION AS INTRUCTED TWO TIMES DAILY.     Off-Protocol Failed - 03/13/2021  3:19 PM      Failed - Medication not assigned to a protocol, review manually.      Passed - Valid encounter within last 12 months    Recent Outpatient Visits           3 days ago Viral upper respiratory tract infection   [place], FNP   1 month ago COPD with asthma [facility])   [place], DO   2 months ago Screening for lung cancer   [place], DO   2 months ago Neck pain   [place], FNP   8 months ago Hyperlipidemia, unspecified hyperlipidemia type   [facility] Delsa Grana, PA-C       Future Appointments             In 1 week [facility], [facility]

## 2021-03-14 ENCOUNTER — Ambulatory Visit: Payer: Self-pay | Admitting: *Deleted

## 2021-03-14 NOTE — Telephone Encounter (Signed)
Chief Complaint: anxious, scared, smoking more than ever. Wants to see PCP thinks she is having a reaction to medications  Symptoms: crying, scared, nervous, worn out, keyed up . Smoking more than ever and will not wear oxygen. Breathing difficulties due to hx . Frequency: since started antibiotics, benzonatate, prednisone Pertinent Negatives: Patient denies wearing oxygen. Disposition: [x] ED /[] Urgent Care (no appt availability in office) / [] Appointment(In office/virtual)/ []  [place] [facility]/ [] Home Care/ [x] Refused Recommended Disposition /[] [place] [facility]/ []  Follow-up with PCP Additional Notes:  Please advise. Request appt regarding medication reaction . Reports only 1 tab left of amoxicillin and prednisone. Declined calling 911 for assistance. Gave [phone] urgent crisis center due to anxiety episode. Please advise today. Patient waiting for call back        Reason for Disposition  Symptoms interfere with work or school  Answer Assessment - Initial Assessment Questions 1. CONCERN: "Did anything happen that prompted you to call today?"      Yes . Scared she can not quit smoking . Antibiotics given started increase in anxiety , feeling "wired up". 2. ANXIETY SYMPTOMS: "Can you describe how you (your loved one; patient) have been feeling?" (e.g., tense, restless, panicky, anxious, keyed up, overwhelmed, sense of impending doom).      Restless, anxious, keyed up. Overwhelmed. Crying  3. ONSET: "How long have you been feeling this way?" (e.g., hours, days, weeks)     Since started antibiotics 4. SEVERITY: "How would you rate the level of anxiety?" (e.g., 0 - 10; or mild, moderate, severe).     Unable to stop smoking and now smoking more than ever 5. FUNCTIONAL IMPAIRMENT: "How have these feelings affected your ability to do daily activities?" "Have you had more difficulty than usual doing your normal daily activities?" (e.g., getting better, same, worse; self-care,  school, work, interactions)     No not able to focus, not able to function clean house, do ADL 6. HISTORY: "Have you felt this way before?" "Have you ever been diagnosed with an anxiety problem in the past?" (e.g., generalized anxiety disorder, panic attacks, PTSD). If Yes, ask: "How was this problem treated?" (e.g., medicines, counseling, etc.)     Take xanax  7. RISK OF HARM - SUICIDAL IDEATION: "Do you ever have thoughts of hurting or killing yourself?" If Yes, ask:  "Do you have these feelings now?" "Do you have a plan on how you would do this?"     No just feels scared she will not stop smoking  8. TREATMENT:  "What has been done so far to treat this anxiety?" (e.g., medicines, relaxation strategies). "What has helped?"     Xanax not helping .  9. TREATMENT - THERAPIST: "Do you have a counselor or therapist? Name?"     na 10. POTENTIAL TRIGGERS: "Do you drink caffeinated beverages (e.g., coffee, colas, teas), and how much daily?" "Do you drink alcohol or use any drugs?" "Have you started any new medicines recently?"     na 10. PATIENT SUPPORT: "Who is with you now?" "Who do you live with?" "Do you have family or friends who you can talk to?"        No  11. OTHER SYMPTOMS: "Do you have any other symptoms?" (e.g., feeling depressed, trouble concentrating, trouble sleeping, trouble breathing, palpitations or fast heartbeat, chest pain, sweating, nausea, or diarrhea)       Trouble concentrating. Crying, worn out . Stopped wearing oxygen  12. PREGNANCY: "Is there any chance you are pregnant?" "When  was your last menstrual period?"       na  Protocols used: Anxiety and Panic Attack-A-AH

## 2021-03-14 NOTE — Telephone Encounter (Signed)
Pt notified, should be done with pills today, and most likely hyped up due to prednisone, if not better in a couple of day of not taking to call us back to schedule an appt.  Pt also told to restart her nicotine patches that was prescribed to stop smoking.

## 2021-03-14 NOTE — Telephone Encounter (Signed)
Called pt to confirm if needed pt states she no longer needs any refills at the moment. Also I do see it was not prescribed from any providers here from the office.

## 2021-03-21 ENCOUNTER — Ambulatory Visit (INDEPENDENT_AMBULATORY_CARE_PROVIDER_SITE_OTHER): Payer: 59

## 2021-03-21 DIAGNOSIS — Z0000 Encounter for general adult medical examination without abnormal findings: Secondary | ICD-10-CM | POA: Diagnosis not present

## 2021-03-21 NOTE — Progress Notes (Signed)
Subjective:   Nicole Austin is a 61 y.o. female who presents for Medicare Annual (Subsequent) preventive examination.  Virtual Visit via Telephone Note  I connected with  [name] on 03/21/21 at  2:10 PM EST by telephone and verified that I am speaking with the correct person using two identifiers.  Location: Patient: home Provider: [place] Persons participating in the virtual visit: [place]   I discussed the limitations, risks, security and privacy concerns of performing an evaluation and management service by telephone and the availability of in person appointments. The patient expressed understanding and agreed to proceed.  Interactive audio and video telecommunications were attempted between this nurse and patient, however failed, due to patient having technical difficulties OR patient did not have access to video capability.  We continued and completed visit with audio only.  Some vital signs may be absent or patient reported.   [name], LPN   Review of Systems     Cardiac Risk Factors include: advanced age (>25men, >91 women);dyslipidemia;hypertension;smoking/ tobacco exposure;sedentary lifestyle;obesity (BMI >30kg/m2)     Objective:    There were no vitals filed for this visit. There is no height or weight on file to calculate BMI.  Advanced Directives 03/21/2021 01/01/2021 01/01/2021 12/31/2020 04/22/2020 03/17/2020 05/14/2019  Does Patient Have a Medical Advance Directive? No No No No No No No  Type of Advance Directive - - - - - - -  Does patient want to make changes to medical advance directive? - - - - - - -  Copy of [place] in Chart? - - - - - - -  Would patient like information on creating a medical advance directive? Yes (MAU/Ambulatory/Procedural Areas - Information given) No - Patient declined - - - Yes (MAU/Ambulatory/Procedural Areas - Information given) No - Patient declined  Some encounter information  is confidential and restricted. Go to Review Flowsheets activity to see all data.    Current Medications (verified) Outpatient Encounter Medications as of 03/21/2021  Medication Sig   albuterol (VENTOLIN HFA) 108 (90 Base) MCG/ACT inhaler Inhale 2 puffs into the lungs every 4 (four) hours as needed for wheezing or shortness of breath.   ALPRAZolam (XANAX) 0.5 MG tablet Take 0.5 mg by mouth 4 (four) times daily.   atorvastatin (LIPITOR) 40 MG tablet Take 1 tablet (40 mg total) by mouth at bedtime.   benzonatate (TESSALON) 100 MG capsule Take 2 capsules (200 mg total) by mouth 2 (two) times daily as needed for cough.   escitalopram (LEXAPRO) 20 MG tablet Take 1 tablet (20 mg total) by mouth daily.   ipratropium-albuterol (DUONEB) 0.5-2.5 (3) MG/3ML SOLN Take 3 mLs by nebulization 3 (three) times daily as needed.   Lurasidone HCl (LATUDA) 60 MG TABS Take 1 tablet (60 mg total) by mouth daily.   meloxicam (MOBIC) 15 MG tablet Take 1 tablet (15 mg total) by mouth daily.   mometasone-formoterol (DULERA) 100-5 MCG/ACT AERO Inhale 2 puffs into the lungs in the morning and at bedtime.   Multiple Vitamin (MULTIVITAMIN WITH MINERALS) TABS tablet Take 1 tablet by mouth daily.   nicotine (NICODERM CQ - DOSED IN MG/24 HOURS) 21 mg/24hr patch Place 1 patch (21 mg total) onto the skin daily.   nystatin ointment (MYCOSTATIN) Apply 1 application topically 2 (two) times daily.   promethazine (PHENERGAN) 25 MG tablet Take 1 tablet (25 mg total) by mouth every 12 (twelve) hours as needed for nausea or vomiting.   Tiotropium Bromide Monohydrate (  SPIRIVA RESPIMAT) 2.5 MCG/ACT AERS INHHALE TWO PUFFS INTO LUNGS DAILY (Patient taking differently: Inhale 2 puffs into the lungs daily.)   tizanidine (ZANAFLEX) 2 MG capsule Take 2-4 mg by mouth 3 (three) times daily as needed for muscle spasms.   divalproex (DEPAKOTE) 250 MG DR tablet Take 1 tablet (250 mg total) by mouth 2 (two) times daily.   tiZANidine (ZANAFLEX) 4 MG  tablet Take 1 tablet (4 mg total) by mouth 3 (three) times daily as needed for muscle spasms. (Patient not taking: Reported on 03/21/2021)   [DISCONTINUED] carbidopa-levodopa (SINEMET IR) 25-100 MG tablet Take by mouth.   [DISCONTINUED] chlorpheniramine-HYDROcodone (TUSSIONEX) 10-8 MG/5ML SUER Take 5 mLs by mouth every 12 (twelve) hours as needed (refractory cough).   [DISCONTINUED] escitalopram (LEXAPRO) 20 MG tablet Take 1 tablet (20 mg total) by mouth daily.   [DISCONTINUED] LATUDA 60 MG TABS Take 60 mg by mouth at bedtime.    [DISCONTINUED] meloxicam (MOBIC) 15 MG tablet Take 15 mg by mouth daily.   [DISCONTINUED] nystatin ointment (MYCOSTATIN) APPLY ONE APPLICATION AS INTRUCTED TWO TIMES DAILY. (Patient taking differently: Apply 1 application topically 2 (two) times daily.)   [DISCONTINUED] predniSONE (DELTASONE) 10 MG tablet Day 1 take 6 pills, day 2 take 5 pills, day 3 take 4 pills, day 4 take 3 pills, day 5 take 2 pills, day 6 take 1 pill   [DISCONTINUED] promethazine (PHENERGAN) 25 MG tablet Take 25 mg by mouth 2 (two) times daily as needed for nausea or vomiting.   [DISCONTINUED] rOPINIRole (REQUIP XL) 4 MG 24 hr tablet Take 4 mg by mouth daily.   [DISCONTINUED] Tiotropium Bromide Monohydrate (SPIRIVA RESPIMAT) 2.5 MCG/ACT AERS Inhale 2 puffs into the lungs daily.   No facility-administered encounter medications on file as of 03/21/2021.    Allergies (verified) Chantix [varenicline] and Codeine   History: Past Medical History:  Diagnosis Date   ADHD (attention deficit hyperactivity disorder)    Apnea, sleep 06/30/2015   Arthritis    Asthma    Asthma with acute exacerbation 06/30/2015   Bipolar 1 disorder (HCC)    Chronic pain    COPD (chronic obstructive pulmonary disease) ([place]) 11/2018   **LUNG SOUNDS WHEEZING AND WITH RALES**   COPD, moderate ([place]) 11/18/2013   Overview:  Last Assessment & Plan:  Refer to pulmonologist Overview:  Overview:  Last Assessment & Plan:  Pneumonia  vaccine offered and given today; she did not tolerate LABA; will start her on Spiriva; stop combivent neb; use only SABA neb OR inhaler, not both; smoking cessation urged, and I am here to help if she wants assistance quitting; f/u in 4 weeks Last Assessment & Plan:  Pneumonia vaccin   Depression    Dyspnea    with exertion   GERD (gastroesophageal reflux disease) 08/30/2015   Gout 08/16/2014   History of acute myocardial infarction 06/30/2015   Overview:  [facility]    HPV (human papilloma virus) infection 07/17/2013   Hyperlipidemia    Low HDL (under 40) 08/30/2015   MI (myocardial infarction) ([place]) 2018   Parkinson disease ([name])    Pre-diabetes    Prediabetes 05/20/2016   PTSD (post-traumatic stress disorder)    history of panic attacks   PTSD (post-traumatic stress disorder)    PTSD (post-traumatic stress disorder)    Restless leg syndrome    Rhabdomyolysis    Sleep apnea 06/30/2015   Stage 3 severe COPD by GOLD classification ([place]) 11/18/2013   Stroke ([place]) 2018   Tremors of nervous system  hands   Uncomplicated asthma 07/31/3327   Varicella 02/25/2017   Past Surgical History:  Procedure Laterality Date   BACK SURGERY     CATARACT EXTRACTION W/PHACO Right 01/01/2019   Procedure: CATARACT EXTRACTION PHACO AND INTRAOCULAR LENS PLACEMENT ([place]) right vision blue;  Surgeon: Marchia Meiers, MD;  Location: [facility] ORS;  Service: Ophthalmology;  Laterality: Right;  [place] 00:39.4 CDE 5.49 Fluid Pack lot # [id number]   CATARACT EXTRACTION W/PHACO Left 01/29/2019   Procedure: CATARACT EXTRACTION PHACO AND INTRAOCULAR LENS PLACEMENT ([place]) LEFT Vision Blue;  Surgeon: Marchia Meiers, MD;  Location: [facility] ORS;  Service: Ophthalmology;  Laterality: Left;  [place] 00:51.1 CDE 4.38 Fluid Pack Lot # [id number]   COLONOSCOPY WITH PROPOFOL N/A 08/07/2017   Procedure: COLONOSCOPY WITH PROPOFOL;  Surgeon: Virgel Manifold, MD;  Location: [facility] ENDOSCOPY;  Service: Endoscopy;  Laterality: N/A;   EYE SURGERY     JOINT  REPLACEMENT Left 2016   tkr   REPLACEMENT TOTAL KNEE Left 2016   Family History  Problem Relation Age of Onset   Hernia Mother    Heart disease Mother    OCD Mother    Diabetes Mother    Parkinson's disease Father    Bipolar disorder Sister    Schizophrenia Sister    ADD / ADHD Sister    Alcohol abuse Brother    Bipolar disorder Sister    Paranoid behavior Sister    ADD / ADHD Sister    ADD / ADHD Son    Dementia Maternal Grandmother    Emphysema Maternal Grandfather    ADD / ADHD Son    ADD / ADHD Son    Depression Son    Social History   Socioeconomic History   Marital status: Single    Spouse name: Banker - sister   Number of children: 3   Years of education: Not on file   Highest education level: 10th grade  Occupational History   Occupation: Disability  Tobacco Use   Smoking status: Every Day    Packs/day: 1.00    Years: 36.00    Pack years: 36.00    Types: Cigarettes   Smokeless tobacco: Never   Tobacco comments:    Pt using nicotine patches; 1/2 PPD as of 03/21/21  Vaping Use   Vaping Use: Former  Substance and Sexual Activity   Alcohol use: No    Alcohol/week: 0.0 standard drinks   Drug use: No   Sexual activity: Not Currently  Other Topics Concern   Not on file  Social History Narrative   Pt lives alone   Social Determinants of Health   Financial Resource Strain: Low Risk    Difficulty of Paying Living Expenses: Not hard at all  Food Insecurity: Not on file  Transportation Needs: Not on file  Physical Activity: Inactive   Days of Exercise per Week: 0 days   Minutes of Exercise per Session: 0 min  Stress: Stress Concern Present   Feeling of Stress : Very much  Social Connections: Socially Isolated   Frequency of Communication with Friends and Family: More than three times a week   Frequency of Social Gatherings with Friends and Family: More than three times a week   Attends Religious Services: Never   Marine scientist or  Organizations: No   Attends Archivist Meetings: Never   Marital Status: Divorced    Tobacco Counseling Ready to quit: Yes Counseling given: No Tobacco comments: Pt using nicotine patches; 1/2 PPD as of  03/21/21   Clinical Intake:  Pre-visit preparation completed: Yes  Pain : No/denies pain     Nutritional Risks: None Diabetes: No  How often do you need to have someone help you when you read instructions, pamphlets, or other written materials from your doctor or pharmacy?: 1 - Never    Interpreter Needed?: No  Information entered by :: [name] LPN   Activities of Daily Living In your present state of health, do you have any difficulty performing the following activities: 03/21/2021 03/10/2021  Hearing? N N  Vision? N N  Difficulty concentrating or making decisions? N N  Walking or climbing stairs? Y N  Dressing or bathing? N N  Doing errands, shopping? N N  Preparing Food and eating ? N -  Using the Toilet? N -  In the past six months, have you accidently leaked urine? Y -  Comment wears pads for protection -  Do you have problems with loss of bowel control? N -  Managing your Medications? N -  Managing your Finances? N -  Housekeeping or managing your Housekeeping? N -  Some recent data might be hidden    Patient Care Team: Delsa Grana, PA-C as PCP - General (Family Medicine) Myer Haff, MD as Referring Physician (Psychiatry) Hambright, Stephani Police, LCSW as Social Worker Sales promotion account executive) Germaine Pomfret, [facility] (Pharmacist)  Indicate any recent Medical Services you may have received from other than [facility] providers in the past year (date may be approximate).     Assessment:   This is a routine wellness examination for Nicole Austin.  Hearing/Vision screen Hearing Screening - Comments:: Pt denies hearing difficulty Vision Screening - Comments:: [facility] screen[facility] at [facility]  Dietary issues and exercise activities  discussed: Current Exercise Habits: The patient does not participate in regular exercise at present, Exercise limited by: None identified   Goals Addressed             This Visit's Progress    DIET - INCREASE WATER INTAKE   On track    Recommend increasing water to 6-8 glasses per day     Increase physical activity   Not on track    Quit Smoking       If you wish to quit smoking, help is available. For free tobacco cessation program offerings call the [facility] at 825-093-5225 or Live Well Line at 930-612-2006. You may also visit [email or web address] or email [email or web address] for more information on other programs.         Depression Screen PHQ 2/9 Scores 03/21/2021 03/10/2021 02/09/2021 01/11/2021 12/20/2020 07/06/2020 04/20/2020  PHQ - 2 Score 6 0 0 6 6 6 5   PHQ- 9 Score 18 - 0 20 11 26 19   Exception Documentation - - - - - - -    Fall Risk Fall Risk  03/21/2021 03/10/2021 02/09/2021 01/11/2021 12/20/2020  Falls in the past year? 0 0 0 0 1  Comment - - - - -  Number falls in past yr: 0 0 0 0 1  Comment - - - - -  Injury with Fall? 0 0 0 0 0  Comment - - - - -  Risk Factor Category  - - - - -  Risk for fall due to : No Fall Risks - No Fall Risks - History of fall(s)  Follow up Falls prevention discussed Falls evaluation completed Falls prevention discussed - Falls evaluation completed  Comment - - - - -  FALL RISK PREVENTION PERTAINING TO THE HOME:  Any stairs in or around the home? No  If so, are there any without handrails? No  Home free of loose throw rugs in walkways, pet beds, electrical cords, etc? Yes  Adequate lighting in your home to reduce risk of falls? Yes   ASSISTIVE DEVICES UTILIZED TO PREVENT FALLS:  Life alert? No  Use of a cane, walker or w/c? Yes  Grab bars in the bathroom? Yes  Shower chair or bench in shower? Yes  Elevated toilet seat or a handicapped toilet? No   TIMED UP AND GO:  Was the test performed? No .  Telephonic visit.   Cognitive Function: Normal cognitive status assessed by direct observation by this Nurse Health Advisor. No abnormalities found.       6CIT Screen 03/17/2020 03/17/2019 02/27/2018  What Year? 0 points 0 points 0 points  What month? 0 points 0 points 0 points  What time? 0 points 0 points 0 points  Count back from 20 0 points 0 points 0 points  Months in reverse 0 points 4 points 0 points  Repeat phrase 2 points 0 points 4 points  Total Score 2 4 4     Immunizations Immunization History  Administered Date(s) Administered   Influenza Split 12/15/2012   Influenza,inj,Quad PF,6+ Mos 10/27/2013, 12/22/2014, 12/13/2015, 10/16/2016, 05/02/2018, 12/20/2020   Influenza-Unspecified 11/07/2011, 12/15/2012   Moderna Sars-Covid-2 Vaccination 06/03/2019, 07/15/2019, 03/08/2020   Pneumococcal Polysaccharide-23 06/21/2015   Tdap 12/13/2015   Zoster Recombinat (Shingrix) 07/25/2018, 10/08/2018    TDAP status: Up to date  Flu Vaccine status: Up to date  Pneumococcal vaccine status: Up to date  Covid-19 vaccine status: Completed vaccines  Qualifies for Shingles Vaccine? Yes   Zostavax completed No   Shingrix Completed?: Yes  Screening Tests Health Maintenance  Topic Date Due   COVID-19 Vaccine (4 - Booster for Moderna series) 05/03/2020   PAP SMEAR-Modifier  05/01/2021   MAMMOGRAM  12/20/2021 (Originally 07/01/1978)   COLONOSCOPY (Pts 45-21yrs Insurance coverage will need to be confirmed)  12/20/2021 (Originally 08/08/2018)   TETANUS/TDAP  12/12/2025   INFLUENZA VACCINE  Completed   Hepatitis C Screening  Completed   HIV Screening  Completed   Zoster Vaccines- Shingrix  Completed   HPV VACCINES  Aged Out    Health Maintenance  Health Maintenance Due  Topic Date Due   COVID-19 Vaccine (4 - Booster for Moderna series) 05/03/2020   PAP SMEAR-Modifier  05/01/2021    Colorectal cancer screening: Type of screening: Colonoscopy. Completed 08/07/17. Repeat every 1  years. Pt declined repeat screening at this time.  Mammogram status: pt declines at this time  Bone density status: due age 9  Lung Cancer Screening: (Low Dose CT Chest recommended if Age 28-80 years, 30 pack-year currently smoking OR have quit w/in 15years.) does qualify. Ordered 01/11/21  Additional Screening:  Hepatitis C Screening: does qualify; Completed 10/16/16  Vision Screening: Recommended annual ophthalmology exams for early detection of glaucoma and other disorders of the eye. Is the patient up to date with their annual eye exam?  Yes  Who is the provider or what is the name of the office in which the patient attends annual eye exams? [facility].   Dental Screening: Recommended annual dental exams for proper oral hygiene  Community Resource Referral / Chronic Care Management: CRR required this visit?  No   CCM required this visit?  No      Plan:     I have personally  reviewed and noted the following in the patients chart:   Medical and social history Use of alcohol, tobacco or illicit drugs  Current medications and supplements including opioid prescriptions.  Functional ability and status Nutritional status Physical activity Advanced directives List of other physicians Hospitalizations, surgeries, and ER visits in previous 12 months Vitals Screenings to include cognitive, depression, and falls Referrals and appointments  In addition, I have reviewed and discussed with patient certain preventive protocols, quality metrics, and best practice recommendations. A written personalized care plan for preventive services as well as general preventive health recommendations were provided to patient.    Due to this being a telephonic visit, the after visit summary with patients personalized plan was offered to patient via mail or my-chart. Patient declined at this time.  [name], LPN   3/83/3383   Nurse Notes: patient c/o sinus congestion and pain due  to head cold. Pt taking OTC Sinus PE max strength (phenylephrine). Pt also c/o pain in right ear she attributes to congestion. Pt advised to contact office if sxs worsen or persist.

## 2021-03-21 NOTE — Patient Instructions (Signed)
Nicole Austin , Thank you for taking time to come for your Medicare Wellness Visit. I appreciate your ongoing commitment to your health goals. Please review the following plan we discussed and let me know if I can assist you in the future.   Screening recommendations/referrals: Colonoscopy: done 08/07/17. Due for repeat screening.  Mammogram: due Bone Density: due age 61 Recommended yearly ophthalmology/optometry visit for glaucoma screening and checkup Recommended yearly dental visit for hygiene and checkup  Vaccinations: Influenza vaccine: done 12/20/20 Pneumococcal vaccine: done 06/21/15 Tdap vaccine: done 12/13/15 Shingles vaccine: done 07/25/18 & 10/08/18   Covid-19:done 06/03/19, 07/15/19 & 03/08/20  Advanced directives: Advance directive discussed with you today. I have provided a copy for you to complete at home and have notarized. Once this is complete please bring a copy in to our office so we can scan it into your chart.   Conditions/risks identified: If you wish to quit smoking, help is available. For free tobacco cessation program offerings call the [facility] at 561-225-7353 or Live Well Line at (854)224-0893. You may also visit [email or web address] or email [email or web address] for more information on other programs.    Next appointment: Follow up in one year for your annual wellness visit    Preventive Care 65 Years and Older, Female Preventive care refers to lifestyle choices and visits with your health care provider that can promote health and wellness. What does preventive care include? A yearly physical exam. This is also called an annual well check. Dental exams once or twice a year. Routine eye exams. Ask your health care provider how often you should have your eyes checked. Personal lifestyle choices, including: Daily care of your teeth and gums. Regular physical activity. Eating a healthy diet. Avoiding tobacco and drug use. Limiting alcohol  use. Practicing safe sex. Taking low-dose aspirin every day. Taking vitamin and mineral supplements as recommended by your health care provider. What happens during an annual well check? The services and screenings done by your health care provider during your annual well check will depend on your age, overall health, lifestyle risk factors, and family history of disease. Counseling  Your health care provider may ask you questions about your: Alcohol use. Tobacco use. Drug use. Emotional well-being. Home and relationship well-being. Sexual activity. Eating habits. History of falls. Memory and ability to understand (cognition). Work and work Statistician. Reproductive health. Screening  You may have the following tests or measurements: Height, weight, and BMI. Blood pressure. Lipid and cholesterol levels. These may be checked every 5 years, or more frequently if you are over 30 years old. Skin check. Lung cancer screening. You may have this screening every year starting at age 16 if you have a 30-pack-year history of smoking and currently smoke or have quit within the past 15 years. Fecal occult blood test (FOBT) of the stool. You may have this test every year starting at age 15. Flexible sigmoidoscopy or colonoscopy. You may have a sigmoidoscopy every 5 years or a colonoscopy every 10 years starting at age 47. Hepatitis C blood test. Hepatitis B blood test. Sexually transmitted disease (STD) testing. Diabetes screening. This is done by checking your blood sugar (glucose) after you have not eaten for a while (fasting). You may have this done every 1-3 years. Bone density scan. This is done to screen for osteoporosis. You may have this done starting at age 62. Mammogram. This may be done every 1-2 years. Talk to your health care provider about how often you  should have regular mammograms. Talk with your health care provider about your test results, treatment options, and if necessary,  the need for more tests. Vaccines  Your health care provider may recommend certain vaccines, such as: Influenza vaccine. This is recommended every year. Tetanus, diphtheria, and acellular pertussis (Tdap, Td) vaccine. You may need a Td booster every 10 years. Zoster vaccine. You may need this after age 42. Pneumococcal 13-valent conjugate (PCV13) vaccine. One dose is recommended after age 3. Pneumococcal polysaccharide (PPSV23) vaccine. One dose is recommended after age 42. Talk to your health care provider about which screenings and vaccines you need and how often you need them. This information is not intended to replace advice given to you by your health care provider. Make sure you discuss any questions you have with your health care provider. Document Released: 03/11/2015 Document Revised: 11/02/2015 Document Reviewed: 12/14/2014 Elsevier Interactive Patient Education  2017 [place] Prevention in the Home Falls can cause injuries. They can happen to people of all ages. There are many things you can do to make your home safe and to help prevent falls. What can I do on the outside of my home? Regularly fix the edges of walkways and driveways and fix any cracks. Remove anything that might make you trip as you walk through a door, such as a raised step or threshold. Trim any bushes or trees on the path to your home. Use bright outdoor lighting. Clear any walking paths of anything that might make someone trip, such as rocks or tools. Regularly check to see if handrails are loose or broken. Make sure that both sides of any steps have handrails. Any raised decks and porches should have guardrails on the edges. Have any leaves, snow, or ice cleared regularly. Use sand or salt on walking paths during winter. Clean up any spills in your garage right away. This includes oil or grease spills. What can I do in the bathroom? Use night lights. Install grab bars by the toilet and in the  tub and shower. Do not use towel bars as grab bars. Use non-skid mats or decals in the tub or shower. If you need to sit down in the shower, use a plastic, non-slip stool. Keep the floor dry. Clean up any water that spills on the floor as soon as it happens. Remove soap buildup in the tub or shower regularly. Attach bath mats securely with double-sided non-slip rug tape. Do not have throw rugs and other things on the floor that can make you trip. What can I do in the bedroom? Use night lights. Make sure that you have a light by your bed that is easy to reach. Do not use any sheets or blankets that are too big for your bed. They should not hang down onto the floor. Have a firm chair that has side arms. You can use this for support while you get dressed. Do not have throw rugs and other things on the floor that can make you trip. What can I do in the kitchen? Clean up any spills right away. Avoid walking on wet floors. Keep items that you use a lot in easy-to-reach places. If you need to reach something above you, use a strong step stool that has a grab bar. Keep electrical cords out of the way. Do not use floor polish or wax that makes floors slippery. If you must use wax, use non-skid floor wax. Do not have throw rugs and other things on the  floor that can make you trip. What can I do with my stairs? Do not leave any items on the stairs. Make sure that there are handrails on both sides of the stairs and use them. Fix handrails that are broken or loose. Make sure that handrails are as long as the stairways. Check any carpeting to make sure that it is firmly attached to the stairs. Fix any carpet that is loose or worn. Avoid having throw rugs at the top or bottom of the stairs. If you do have throw rugs, attach them to the floor with carpet tape. Make sure that you have a light switch at the top of the stairs and the bottom of the stairs. If you do not have them, ask someone to add them for  you. What else can I do to help prevent falls? Wear shoes that: Do not have high heels. Have rubber bottoms. Are comfortable and fit you well. Are closed at the toe. Do not wear sandals. If you use a stepladder: Make sure that it is fully opened. Do not climb a closed stepladder. Make sure that both sides of the stepladder are locked into place. Ask someone to hold it for you, if possible. Clearly mark and make sure that you can see: Any grab bars or handrails. First and last steps. Where the edge of each step is. Use tools that help you move around (mobility aids) if they are needed. These include: Canes. Walkers. Scooters. Crutches. Turn on the lights when you go into a dark area. Replace any light bulbs as soon as they burn out. Set up your furniture so you have a clear path. Avoid moving your furniture around. If any of your floors are uneven, fix them. If there are any pets around you, be aware of where they are. Review your medicines with your doctor. Some medicines can make you feel dizzy. This can increase your chance of falling. Ask your doctor what other things that you can do to help prevent falls. This information is not intended to replace advice given to you by your health care provider. Make sure you discuss any questions you have with your health care provider. Document Released: 12/09/2008 Document Revised: 07/21/2015 Document Reviewed: 03/19/2014 Elsevier Interactive Patient Education  2017 [employer].

## 2021-03-30 ENCOUNTER — Telehealth: Payer: Self-pay

## 2021-03-30 NOTE — Progress Notes (Signed)
Chronic Care Management Pharmacy Assistant   Name: Shenandoah Yeats  MRN: [id number] DOB: 07-21-1960  Reason for Encounter:COPD Disease State Call.   Recent office visits:  03/21/2021 [name] LPN (PCP Office) Medicare Wellness completed,No medication Changes noted 03/10/2021 Serafina Royals FNP (PCP Office) start amoxicillin-clavulanate 875-125 MG tablet 2 times daily, start benzonatate 200 mg PRN, Start Prednisone 10 mg for 6 days 02/09/2021 [name] DO (PCP office) Increase Nicotine to 21 mg transdermal daily, Ambulatory referral to Neurology, Follow up 1 month  Recent consult visits:  No recent consult visit  Hospital visits:  None in previous 6 months  Medications: Outpatient Encounter Medications as of 03/30/2021  Medication Sig Note   albuterol (VENTOLIN HFA) 108 (90 Base) MCG/ACT inhaler Inhale 2 puffs into the lungs every 4 (four) hours as needed for wheezing or shortness of breath.    ALPRAZolam (XANAX) 0.5 MG tablet Take 0.5 mg by mouth 4 (four) times daily.    atorvastatin (LIPITOR) 40 MG tablet Take 1 tablet (40 mg total) by mouth at bedtime.    benzonatate (TESSALON) 100 MG capsule Take 2 capsules (200 mg total) by mouth 2 (two) times daily as needed for cough.    divalproex (DEPAKOTE) 250 MG DR tablet Take 1 tablet (250 mg total) by mouth 2 (two) times daily.    escitalopram (LEXAPRO) 20 MG tablet Take 1 tablet (20 mg total) by mouth daily.    ipratropium-albuterol (DUONEB) 0.5-2.5 (3) MG/3ML SOLN Take 3 mLs by nebulization 3 (three) times daily as needed.    Lurasidone HCl (LATUDA) 60 MG TABS Take 1 tablet (60 mg total) by mouth daily.    meloxicam (MOBIC) 15 MG tablet Take 1 tablet (15 mg total) by mouth daily.    mometasone-formoterol (DULERA) 100-5 MCG/ACT AERO Inhale 2 puffs into the lungs in the morning and at bedtime.    Multiple Vitamin (MULTIVITAMIN WITH MINERALS) TABS tablet Take 1 tablet by mouth daily. 04/20/2020: Centrum Silver 50+ Womens     nicotine (NICODERM CQ - DOSED IN MG/24 HOURS) 21 mg/24hr patch Place 1 patch (21 mg total) onto the skin daily.    nystatin ointment (MYCOSTATIN) Apply 1 application topically 2 (two) times daily.    promethazine (PHENERGAN) 25 MG tablet Take 1 tablet (25 mg total) by mouth every 12 (twelve) hours as needed for nausea or vomiting.    Tiotropium Bromide Monohydrate (SPIRIVA RESPIMAT) 2.5 MCG/ACT AERS INHHALE TWO PUFFS INTO LUNGS DAILY (Patient taking differently: Inhale 2 puffs into the lungs daily.)    tizanidine (ZANAFLEX) 2 MG capsule Take 2-4 mg by mouth 3 (three) times daily as needed for muscle spasms.    tiZANidine (ZANAFLEX) 4 MG tablet Take 1 tablet (4 mg total) by mouth 3 (three) times daily as needed for muscle spasms. (Patient not taking: Reported on 03/21/2021)    No facility-administered encounter medications on file as of 03/30/2021.    Care Gaps: COVID-19 Vaccine (4- Booster Moderna Series)   Star Rating Drugs: Atorvastatin 40 mg last filled 12/24/2020 90 day supply at [facility].   Medication Fill Gaps: None ID  Current COPD regimen:  Ventolin HFA 108 mcg/act 2 puff every 4 hours as needed  Duoneb 58mL nebulizer three times daily as needed  Spiriva 2.5 mcg/act 2 puffs daily  Dulera 100-5 MCG/ACT inhale 2 puffs into lungs in the morning and bedtime No flowsheet data found. Any recent hospitalizations or ED visits since last visit with CPP? No  What recent interventions/DTPs have  been made by any provider to improve breathing since last visit:None ID  I have attempted without success to contact this patient by phone three times to do her COPD Disease State call. I left a Voice message for patient to return my call.  Adherence Review: Does the patient have >5 day gap between last estimated fill date for maintenance inhaler medications? No  Anderson [place] Clinical Production designer, theatre/television/film [phone]

## 2021-04-10 ENCOUNTER — Ambulatory Visit: Payer: 59 | Admitting: Physician Assistant

## 2021-04-15 ENCOUNTER — Emergency Department
Admission: EM | Admit: 2021-04-15 | Discharge: 2021-04-15 | Disposition: A | Discharge status: Home / Self Care | Payer: 59 | Attending: Emergency Medicine | Admitting: Emergency Medicine

## 2021-04-15 ENCOUNTER — Emergency Department: Payer: 59

## 2021-04-15 ENCOUNTER — Other Ambulatory Visit: Payer: Self-pay

## 2021-04-15 ENCOUNTER — Encounter: Payer: Self-pay | Admitting: Emergency Medicine

## 2021-04-15 DIAGNOSIS — L0291 Cutaneous abscess, unspecified: Secondary | ICD-10-CM

## 2021-04-15 DIAGNOSIS — R079 Chest pain, unspecified: Secondary | ICD-10-CM | POA: Diagnosis present

## 2021-04-15 DIAGNOSIS — L03313 Cellulitis of chest wall: Secondary | ICD-10-CM | POA: Insufficient documentation

## 2021-04-15 DIAGNOSIS — J449 Chronic obstructive pulmonary disease, unspecified: Secondary | ICD-10-CM | POA: Diagnosis not present

## 2021-04-15 DIAGNOSIS — L0390 Cellulitis, unspecified: Secondary | ICD-10-CM

## 2021-04-15 LAB — BASIC METABOLIC PANEL
Anion gap: 8 (ref 5–15)
BUN: 11 mg/dL (ref 8–23)
CO2: 32 mmol/L (ref 22–32)
Calcium: 9.3 mg/dL (ref 8.9–10.3)
Chloride: 93 mmol/L — ABNORMAL LOW (ref 98–111)
Creatinine, Ser: 0.96 mg/dL (ref 0.44–1.00)
GFR, Estimated: >60 mL/min (ref >60)
Glucose, Bld: 95 mg/dL (ref 70–99)
Potassium: 4 mmol/L (ref 3.5–5.1)
Sodium: 133 mmol/L — ABNORMAL LOW (ref 135–145)

## 2021-04-15 LAB — CBC
HCT: 39.4 % (ref 36.0–46.0)
Hemoglobin: 13.3 g/dL (ref 12.0–15.0)
MCH: 30.4 pg (ref 26.0–34.0)
MCHC: 33.8 g/dL (ref 30.0–36.0)
MCV: 90 fL (ref 80.0–100.0)
Platelets: 242 10*3/uL (ref 150–400)
RBC: 4.38 MIL/uL (ref 3.87–5.11)
RDW: 12.7 % (ref 11.5–15.5)
WBC: 9.9 10*3/uL (ref 4.0–10.5)
nRBC: 0 % (ref 0.0–0.2)

## 2021-04-15 LAB — TROPONIN I (HIGH SENSITIVITY): Troponin I (High Sensitivity): 3 ng/L (ref <18)

## 2021-04-15 MED ORDER — OXYCODONE HCL 5 MG PO TABS
5.0000 mg | ORAL_TABLET | Freq: Once | ORAL | Status: AC
  Administered 2021-04-15: 5 mg via ORAL
  Filled 2021-04-15: qty 1

## 2021-04-15 MED ORDER — DOXYCYCLINE MONOHYDRATE 100 MG PO TABS: 100.0000 mg | ORAL_TABLET | Freq: Two times a day (BID) | ORAL | 0 refills | Status: DC

## 2021-04-15 MED ORDER — CEPHALEXIN 500 MG PO CAPS: 500.0000 mg | ORAL_CAPSULE | Freq: Two times a day (BID) | ORAL | 0 refills | Status: DC

## 2021-04-15 MED ORDER — OXYCODONE HCL 5 MG PO TABS: 5.0000 mg | ORAL_TABLET | Freq: Four times a day (QID) | ORAL | 0 refills | Status: AC | PRN

## 2021-04-15 MED ORDER — OXYCODONE HCL 5 MG PO TABS
5.0000 mg | ORAL_TABLET | Freq: Four times a day (QID) | ORAL | 0 refills | Status: DC | PRN
Start: 2021-04-15 — End: 2021-04-15

## 2021-04-15 NOTE — Discharge Instructions (Addendum)
Do warm compresses over this area call your primary care office on Monday so they can help schedule you a repeat ultrasound and help set up a mammogram outpatient.  Return to the ER for worsening symptoms including fevers, worsening spreading or any other concerns.  Do not drink or use the oxycodone with your benzos  IMPRESSION: 1. 1 cm heterogeneous collection with adjacent inflammation/edema in the UPPER OUTER LEFT breast with a tract to the skin. This may represent a small area of infection/abscess or inflamed/infected sebaceous cyst. Given complexity to this collection, it is felt that this collection would be very difficult to drain by needle aspiration. 2. 0.7 cm inflamed versus infected sebaceous cyst within the LEFT axilla. 3. Short-term follow-up is recommended after appropriate antibiotic therapy.  RECOMMENDATION: Bilateral diagnostic mammogram and LEFT breast ultrasound in 5-7 days after initiation of antibiotic therapy.  I have discussed the findings and recommendations with the patient. If applicable, a reminder letter will be sent to the patient regarding the next appointment.  BI-RADS CATEGORY 3: Probably benign.   Take oxycodone as prescribed. Do not drink alcohol, drive or participate in any other potentially dangerous activities while taking this medication as it may make you sleepy. Do not take this medication with any other sedating medications, either prescription or over-the-counter. If you were prescribed Percocet or Vicodin, do not take these with acetaminophen (Tylenol) as it is already contained within these medications.  This medication is an opiate (or narcotic) pain medication and can be habit forming. Use it as little as possible to achieve adequate pain control. Do not use or use it with extreme caution if you have a history of opiate abuse or dependence. If you are on a pain contract with your primary care doctor or a pain specialist, be sure to let them know  you were prescribed this medication today from the Emergency Department. This medication is intended for your use only - do not give any to anyone else and keep it in a secure place where nobody else, especially children, have access to it.

## 2021-04-15 NOTE — ED Triage Notes (Signed)
Pt via POV from home. Pt c/o L sided CP that started in her underarms on Thursday and travelled to her chest. Pt is SOB and has a hx of COPD. Pt states that she is 1.5L of O2 chronically.Pt is A&OX4 and NAD.

## 2021-04-15 NOTE — ED Provider Notes (Signed)
[facility] Provider Note    Event Date/Time   First MD Initiated Contact with Patient 04/15/21 1206     (approximate)   History   Chest Pain   HPI  Nicole Austin is a 61 y.o. female with COPD, bipolar, fibromyalgia who comes in for chest pain.  Patient reports some increasing chest pain with pressure on the left side.  She reports that the pain is worse when she presses on it.  She denies any history of heart attacks.  Denies any shortness of breath.  She is on her normal oxygen level.  Denies any swelling in her legs.  Denies any fevers.   Physical Exam   Triage Vital Signs: ED Triage Vitals  Enc Vitals Group     BP 04/15/21 1112 (!) 147/64     Pulse Rate 04/15/21 1112 81     Resp 04/15/21 1112 20     Temp 04/15/21 1112 98.3 F (36.8 C)     Temp Source 04/15/21 1112 Oral     SpO2 04/15/21 1112 93 %     Weight 04/15/21 1113 220 lb (99.8 kg)     Height 04/15/21 1113 5\' 1"  (1.549 m)     Head Circumference --      Peak Flow --      Pain Score 04/15/21 1113 10     Pain Loc --      Pain Edu? --      Excl. in [place]? --     Most recent vital signs: Vitals:   04/15/21 1112  BP: (!) 147/64  Pulse: 81  Resp: 20  Temp: 98.3 F (36.8 C)  SpO2: 93%     General: Awake, no distress.  CV:  Good peripheral perfusion.  Resp:  Normal effort.  Abd:  No distention.  Other:  See media tab.  Patient has a white nodule noted in the armpit and 1 noted on the left breast wall with surrounding erythema, warmth, redness   ED Results / Procedures / Treatments   Labs (all labs ordered are listed, but only abnormal results are displayed) Labs Reviewed  BASIC METABOLIC PANEL - Abnormal; Notable for the following components:      Result Value   Sodium 133 (*)    Chloride 93 (*)    All other components within normal limits  CBC  TROPONIN I (HIGH SENSITIVITY)     EKG  My interpretation of EKG: Normal sinus rate of 81 without any ST elevation or  T wave inversions, normal intervals   RADIOLOGY I have reviewed the [place] personally no obvious large abscess   I reviewed the x-ray personally without evidence of pneumonia  PROCEDURES:  Critical Care performed: No  Procedures   MEDICATIONS ORDERED IN ED: Medications  oxyCODONE (Oxy IR/ROXICODONE) immediate release tablet 5 mg (5 mg Oral Given 04/15/21 1313)     IMPRESSION / MDM / ASSESSMENT AND PLAN / ED COURSE  I reviewed the triage vital signs and the nursing notes.   Differential diagnosis includes, but is not limited to, ACS, pneumonia.  However based on my examination patient has cellulitis.  There already 2 white nodules noted 1 in her armpit and one noted on the lateral side of her left breast.  Please see media tab.  Suspect patient has cellulitis but will get ultrasound to make sure no evidence of underlying abscess that would require drainage.  Patient is not septic.  Discussed case with ID Dr. Juleen China and just  recommends warm complexes for pustule nodules but agrees with antibiotics and outpatient management  Troponin is negative. Bmp Labs show slightly low sodium and chloride similar to prior Her white count is normal on CBC  Patient given oxycodone to help with pain.  She understands not to use the oxycodone in addition with her Xanax and do not drive on it.  Ultrasound with maybe 1 cm collection that they stated would be very difficult to drain.  We will start off with some antibiotics with Keflex and doxycycline and have her follow-up for repeat ultrasound and mammogram.  She expressed understanding already has follow-up with her primary care doctor on Thursday.  Extract her to call them on Monday to help him schedule an repeat ultrasound.  She expressed understanding felt comfortable with plan and understands return precautions in regards to fevers, worsening pain.  Considered admission given the  cellulitis but given patient is afebrile patient would prefer to go  home and trial oral antibiotics  I discussed the provisional nature of ED diagnosis, the treatment so far, the ongoing plan of care, follow up appointments and return precautions with the patient and any family or support people present. They expressed understanding and agreed with the plan, discharged home.         FINAL CLINICAL IMPRESSION(S) / ED DIAGNOSES   Final diagnoses:  Cellulitis     Rx / DC Orders   ED Discharge Orders     None        Note:  This document was prepared using Dragon voice recognition software and may include unintentional dictation errors.   Vanessa [place], MD 04/15/21 [phone]

## 2021-04-15 NOTE — ED Notes (Signed)
Pt to ED for soreness in chest since 2 days and it hurts to breathe deeply. Has to "hold chest" when walks or moves certain ways. At times chest feels heavy.  Lives alone at home with cat, who is "special needs".  Pt wears 2L at baseline and is current smoker, "5 cigarettes/day". Pt has occasional weak cough that sounds wet.  Pt in wheelchair. Is ambulatory at home.

## 2021-04-17 ENCOUNTER — Telehealth: Payer: Self-pay

## 2021-04-17 ENCOUNTER — Ambulatory Visit: Payer: Self-pay | Admitting: *Deleted

## 2021-04-17 NOTE — Telephone Encounter (Signed)
Second attempted to call patient- no answer and mailbox is full. Unable to leave call back message

## 2021-04-17 NOTE — Telephone Encounter (Signed)
Please advise 

## 2021-04-17 NOTE — Telephone Encounter (Signed)
Summary: abx & oxy making her sick to stomach   Pt states she cannot seem to get the abx she is on and the oxycodone to stay down.  She says when she takes together it makes her sick and she vomits.  Needs to keep abx down please advise     Attempted to call patient to discuss medication SE- vomiting. No answer- mailbox is full- unable to leave message

## 2021-04-17 NOTE — Telephone Encounter (Signed)
Third attempt to contact patient- no answer and mailbox is full.

## 2021-04-17 NOTE — Telephone Encounter (Signed)
Pt notified to eat w/ antibiotic

## 2021-04-17 NOTE — Telephone Encounter (Signed)
Reason for CRM: pt went to ED on Sat. They recommend another [place] within 5-7 days after abx  But she has to schedule her ride 5 days out.  Would like to know if we can get her to [place] appointment when it is scheduled

## 2021-04-20 ENCOUNTER — Ambulatory Visit (INDEPENDENT_AMBULATORY_CARE_PROVIDER_SITE_OTHER): Payer: 59 | Admitting: Surgery

## 2021-04-20 ENCOUNTER — Encounter: Payer: Self-pay | Admitting: Surgery

## 2021-04-20 ENCOUNTER — Other Ambulatory Visit: Payer: Self-pay

## 2021-04-20 ENCOUNTER — Encounter: Payer: Self-pay | Admitting: Internal Medicine

## 2021-04-20 ENCOUNTER — Ambulatory Visit (INDEPENDENT_AMBULATORY_CARE_PROVIDER_SITE_OTHER): Payer: 59 | Admitting: Internal Medicine

## 2021-04-20 VITALS — BP 126/84 | HR 86 | Temp 98.3°F | Ht 61.0 in | Wt 231.0 lb

## 2021-04-20 VITALS — BP 122/64 | HR 104 | Temp 98.1°F | Resp 20 | Ht 61.0 in | Wt 231.9 lb

## 2021-04-20 DIAGNOSIS — L02412 Cutaneous abscess of left axilla: Secondary | ICD-10-CM

## 2021-04-20 DIAGNOSIS — N611 Abscess of the breast and nipple: Secondary | ICD-10-CM | POA: Diagnosis not present

## 2021-04-20 DIAGNOSIS — L0291 Cutaneous abscess, unspecified: Secondary | ICD-10-CM | POA: Diagnosis not present

## 2021-04-20 DIAGNOSIS — B49 Unspecified mycosis: Secondary | ICD-10-CM

## 2021-04-20 MED ORDER — SULFAMETHOXAZOLE-TRIMETHOPRIM 800-160 MG PO TABS: 1.0000 | ORAL_TABLET | Freq: Two times a day (BID) | ORAL | 0 refills | Status: DC

## 2021-04-20 MED ORDER — NYSTATIN 100000 UNIT/GM EX POWD: 1.0000 "application " | Freq: Three times a day (TID) | CUTANEOUS | 0 refills | Status: DC

## 2021-04-20 MED ORDER — DOXYCYCLINE HYCLATE 100 MG PO TABS: 100.0000 mg | ORAL_TABLET | Freq: Two times a day (BID) | ORAL | 0 refills | Status: DC

## 2021-04-20 MED ORDER — IBUPROFEN 600 MG PO TABS: 600.0000 mg | ORAL_TABLET | Freq: Four times a day (QID) | ORAL | 0 refills | Status: DC | PRN

## 2021-04-20 NOTE — Patient Instructions (Signed)
You may shower tomorrow. First remove your dressings and packing. Shower letting the warm soapy water run over the area. Rinse areas well pat dry, then repack and redress.  You will pack the area once daily with the iodoform packing until you meet resistance, let some hang out, then place 1-2 dry gauze to the are and tape in place. For the area under the arm just use a dry gauze dressing and tape in place.   Follow up here in 9 days.  We have sent in a prescription for Motrin/Ibuprofen 600 mg to your pharmacy. Take ibuprofen, then 3-4 hours later take 2 extra strength Tylenol, then 3-4 hours later take the Ibuprofen and so on for pain control.   We have sent in a prescription for antibiotics, please take all of your medication.

## 2021-04-20 NOTE — Patient Instructions (Addendum)
It was great seeing you today!  Plan discussed at today's visit: -Finished first two antibiotics as directed, then continue with Doxycycline twice a day for another 7 days for a total of 14 days  Follow up in: as needed  Take care and let us know if you have any questions or concerns prior to your next visit.  Dr. Rosana Berger

## 2021-04-20 NOTE — Progress Notes (Signed)
Acute Office Visit  Subjective:    Patient ID: Nicole Austin, female    DOB: [birth year], 61 y.o.   MRN: [id number]  Chief Complaint  Patient presents with   Cellulitis    Left side of breast.  Pt states worse and still on antibiotics    HPI Patient is in today for lump under arm. Was in the ER for chest pain recently but evaluation for lump under arm as well. US showed 1 cm heterogenous collection with adjacent inflammation/edema in the upper outer left breast qudrant. Consider abscess vs infected sebaceous cyst. She was told to do warm compresses and was given Keflex and Doxycycline and Oxycodone with suggestion of repeat US and mammogram.   Today she states that she first noticed symptoms last week while showering, lump with white center in her left axillary region. It is very tender and she cannot bare to touch it or lift her arm. Has been taking antibiotics but feels the pain is worse and it has enlarged> Additionally, there is a new similar lump on side left breast. No fevers/chills.   Past Medical History:  Diagnosis Date   ADHD (attention deficit hyperactivity disorder)    Apnea, sleep 06/30/2015   Arthritis    Asthma    Asthma with acute exacerbation 06/30/2015   Bipolar 1 disorder (HCC)    Chronic pain    COPD (chronic obstructive pulmonary disease) ([place]) 11/2018   **LUNG SOUNDS WHEEZING AND WITH RALES**   COPD, moderate ([place]) 11/18/2013   Overview:  Last Assessment & Plan:  Refer to pulmonologist Overview:  Overview:  Last Assessment & Plan:  Pneumonia vaccine offered and given today; she did not tolerate LABA; will start her on Spiriva; stop combivent neb; use only SABA neb OR inhaler, not both; smoking cessation urged, and I am here to help if she wants assistance quitting; f/u in 4 weeks Last Assessment & Plan:  Pneumonia vaccin   Depression    Dyspnea    with exertion   GERD (gastroesophageal reflux disease) 08/30/2015   Gout 08/16/2014   History of acute myocardial  infarction 06/30/2015   Overview:  [facility]    HPV (human papilloma virus) infection 07/17/2013   Hyperlipidemia    Low HDL (under 40) 08/30/2015   MI (myocardial infarction) ([place]) 2018   Parkinson disease ([place])    Pre-diabetes    Prediabetes 05/20/2016   PTSD (post-traumatic stress disorder)    history of panic attacks   PTSD (post-traumatic stress disorder)    PTSD (post-traumatic stress disorder)    Restless leg syndrome    Rhabdomyolysis    Sleep apnea 06/30/2015   Stage 3 severe COPD by GOLD classification ([place]) 11/18/2013   Stroke ([place]) 2018   Tremors of nervous system    hands   Uncomplicated asthma 03/05/5629   Varicella 02/25/2017    Past Surgical History:  Procedure Laterality Date   BACK SURGERY     CATARACT EXTRACTION W/PHACO Right 01/01/2019   Procedure: CATARACT EXTRACTION PHACO AND INTRAOCULAR LENS PLACEMENT ([place]) right vision blue;  Surgeon: Marchia Meiers, MD;  Location: [facility] ORS;  Service: Ophthalmology;  Laterality: Right;  [place] 00:39.4 CDE 5.49 Fluid Pack lot # [id number]   CATARACT EXTRACTION W/PHACO Left 01/29/2019   Procedure: CATARACT EXTRACTION PHACO AND INTRAOCULAR LENS PLACEMENT ([place]) LEFT Vision Blue;  Surgeon: Marchia Meiers, MD;  Location: [facility] ORS;  Service: Ophthalmology;  Laterality: Left;  [place] 00:51.1 CDE 4.38 Fluid Pack Lot # [id number]   COLONOSCOPY WITH PROPOFOL  N/A 08/07/2017   Procedure: COLONOSCOPY WITH PROPOFOL;  Surgeon: Virgel Manifold, MD;  Location: [facility] ENDOSCOPY;  Service: Endoscopy;  Laterality: N/A;   EYE SURGERY     JOINT REPLACEMENT Left 2016   tkr   REPLACEMENT TOTAL KNEE Left 2016    Family History  Problem Relation Age of Onset   Hernia Mother    Heart disease Mother    OCD Mother    Diabetes Mother    Parkinson's disease Father    Bipolar disorder Sister    Schizophrenia Sister    ADD / ADHD Sister    Alcohol abuse Brother    Bipolar disorder Sister    Paranoid behavior Sister    ADD / ADHD Sister    ADD / ADHD Son    Dementia  Maternal Grandmother    Emphysema Maternal Grandfather    ADD / ADHD Son    ADD / ADHD Son    Depression Son     Social History   Socioeconomic History   Marital status: Single    Spouse name: Banker - sister   Number of children: 3   Years of education: Not on file   Highest education level: 10th grade  Occupational History   Occupation: Disability  Tobacco Use   Smoking status: Every Day    Packs/day: 1.00    Years: 36.00    Pack years: 36.00    Types: Cigarettes   Smokeless tobacco: Never   Tobacco comments:    Pt using nicotine patches; 1/2 PPD as of 03/21/21  Vaping Use   Vaping Use: Former  Substance and Sexual Activity   Alcohol use: No    Alcohol/week: 0.0 standard drinks   Drug use: No   Sexual activity: Not Currently  Other Topics Concern   Not on file  Social History Narrative   Pt lives alone   Social Determinants of Health   Financial Resource Strain: Low Risk    Difficulty of Paying Living Expenses: Not hard at all  Food Insecurity: Not on file  Transportation Needs: Not on file  Physical Activity: Inactive   Days of Exercise per Week: 0 days   Minutes of Exercise per Session: 0 min  Stress: Stress Concern Present   Feeling of Stress : Very much  Social Connections: Socially Isolated   Frequency of Communication with Friends and Family: More than three times a week   Frequency of Social Gatherings with Friends and Family: More than three times a week   Attends Religious Services: Never   Marine scientist or Organizations: No   Attends Music therapist: Never   Marital Status: Divorced  Human resources officer Violence: Not At Risk   Fear of Current or Ex-Partner: No   Emotionally Abused: No   Physically Abused: No   Sexually Abused: No    Outpatient Medications Prior to Visit  Medication Sig Dispense Refill   albuterol (VENTOLIN HFA) 108 (90 Base) MCG/ACT inhaler Inhale 2 puffs into the lungs every 4 (four) hours as needed  for wheezing or shortness of breath. 18 g 2   ALPRAZolam (XANAX) 0.5 MG tablet Take 0.5 mg by mouth 4 (four) times daily.     atorvastatin (LIPITOR) 40 MG tablet Take 1 tablet (40 mg total) by mouth at bedtime. 30 tablet 2   benzonatate (TESSALON) 100 MG capsule Take 2 capsules (200 mg total) by mouth 2 (two) times daily as needed for cough. 20 capsule 0   cephALEXin (KEFLEX)  500 MG capsule Take 1 capsule (500 mg total) by mouth 2 (two) times daily for 7 days. 14 capsule 0   divalproex (DEPAKOTE) 250 MG DR tablet Take 1 tablet (250 mg total) by mouth 2 (two) times daily. 60 tablet 2   doxycycline (ADOXA) 100 MG tablet Take 1 tablet (100 mg total) by mouth 2 (two) times daily for 7 days. 14 tablet 0   escitalopram (LEXAPRO) 20 MG tablet Take 1 tablet (20 mg total) by mouth daily. 30 tablet 2   ipratropium-albuterol (DUONEB) 0.5-2.5 (3) MG/3ML SOLN Take 3 mLs by nebulization 3 (three) times daily as needed. 180 mL 1   Lurasidone HCl (LATUDA) 60 MG TABS Take 1 tablet (60 mg total) by mouth daily. 30 tablet 2   mometasone-formoterol (DULERA) 100-5 MCG/ACT AERO Inhale 2 puffs into the lungs in the morning and at bedtime. 1 each 3   Multiple Vitamin (MULTIVITAMIN WITH MINERALS) TABS tablet Take 1 tablet by mouth daily.     nicotine (NICODERM CQ - DOSED IN MG/24 HOURS) 21 mg/24hr patch Place 1 patch (21 mg total) onto the skin daily. 45 patch 0   nystatin ointment (MYCOSTATIN) Apply 1 application topically 2 (two) times daily. 30 g 0   promethazine (PHENERGAN) 25 MG tablet Take 1 tablet (25 mg total) by mouth every 12 (twelve) hours as needed for nausea or vomiting. 30 tablet 0   Tiotropium Bromide Monohydrate (SPIRIVA RESPIMAT) 2.5 MCG/ACT AERS INHHALE TWO PUFFS INTO LUNGS DAILY (Patient taking differently: Inhale 2 puffs into the lungs daily.) 4 g 2   tizanidine (ZANAFLEX) 2 MG capsule Take 2-4 mg by mouth 3 (three) times daily as needed for muscle spasms.     tiZANidine (ZANAFLEX) 4 MG tablet Take 1  tablet (4 mg total) by mouth 3 (three) times daily as needed for muscle spasms. (Patient not taking: Reported on 03/21/2021) 30 tablet 0   No facility-administered medications prior to visit.    Allergies  Allergen Reactions   Chantix [Varenicline] Nausea Only   Codeine Nausea And Vomiting    Review of Systems  Constitutional:  Negative for chills and fever.  Gastrointestinal:  Negative for diarrhea, nausea and vomiting.  Skin:  Positive for color change and rash.      Objective:    Physical Exam Constitutional:      Appearance: Normal appearance. She is obese.  HENT:     Head: Normocephalic and atraumatic.  Eyes:     Conjunctiva/sclera: Conjunctivae normal.  Cardiovascular:     Rate and Rhythm: Normal rate and regular rhythm.  Pulmonary:     Effort: Pulmonary effort is normal.     Breath sounds: Normal breath sounds.  Skin:    Comments: Abscess with white center in left axilla with surrounding erythema and swelling, very tender to touch. A second 1x1 inch abscess on the side of her left breast with erythema and swelling. Fungal rash under breast as well.   Neurological:     General: No focal deficit present.     Mental Status: She is alert. Mental status is at baseline.  Psychiatric:        Mood and Affect: Mood normal.        Behavior: Behavior normal.    BP 122/64    Pulse (!) 104    Temp 98.1 F (36.7 C)    Resp 20    Ht 5\' 1"  (1.549 m)    Wt 231 lb 14.4 oz (105.2 kg)    LMP  (  LMP Unknown)    SpO2 91%    BMI 43.82 kg/m  Wt Readings from Last 3 Encounters:  04/20/21 231 lb 14.4 oz (105.2 kg)  04/15/21 220 lb (99.8 kg)  01/11/21 222 lb 4.8 oz (100.8 kg)    Health Maintenance Due  Topic Date Due   COVID-19 Vaccine (4 - Booster for Moderna series) 05/03/2020   PAP SMEAR-Modifier  05/01/2021    There are no preventive care reminders to display for this patient.   Lab Results  Component Value Date   TSH 1.000 05/14/2019   Lab Results  Component Value Date    WBC 9.9 04/15/2021   HGB 13.3 04/15/2021   HCT 39.4 04/15/2021   MCV 90.0 04/15/2021   PLT 242 04/15/2021   Lab Results  Component Value Date   NA 133 (L) 04/15/2021   K 4.0 04/15/2021   CO2 32 04/15/2021   GLUCOSE 95 04/15/2021   BUN 11 04/15/2021   CREATININE 0.96 04/15/2021   BILITOT 0.6 01/03/2021   ALKPHOS 76 01/03/2021   AST 27 01/03/2021   ALT 24 01/03/2021   PROT 6.1 (L) 01/03/2021   ALBUMIN 2.7 (L) 01/03/2021   CALCIUM 9.3 04/15/2021   ANIONGAP 8 04/15/2021   Lab Results  Component Value Date   CHOL 117 12/07/2019   Lab Results  Component Value Date   HDL 31 (L) 12/07/2019   Lab Results  Component Value Date   LDLCALC 58 12/07/2019   Lab Results  Component Value Date   TRIG 216 (H) 12/07/2019   Lab Results  Component Value Date   CHOLHDL 3.8 12/07/2019   Lab Results  Component Value Date   HGBA1C 5.8 (H) 01/03/2021       Assessment & Plan:   1. Abscess: Extended antibiotics that cover MRSA but will need to be lanced. Referral to general surgery placed.   - doxycycline (VIBRA-TABS) 100 MG tablet; Take 1 tablet (100 mg total) by mouth 2 (two) times daily for 7 days.  Dispense: 14 tablet; Refill: 0 - Ambulatory referral to General Surgery  2. Fungal infection:   - nystatin (MYCOSTATIN/NYSTOP) powder; Apply 1 application topically 3 (three) times daily.  Dispense: 15 g; Refill: 0    [name], DO

## 2021-04-20 NOTE — Progress Notes (Signed)
Patient ID: Nicole Austin, female   DOB: February 15, 1961, 61 y.o.   MRN: [id number]  Chief Complaint: Boils of left lateral breast and left axilla.  History of Present Illness Nicole Austin is a 61 y.o. female with small abscesses of the left breast and axilla as noted, apparently first appeared Friday the 17th.  Was treated with oral doxycycline without progress.  She completed her last dose today.  She denies fevers and chills.  She denies abnormal blood sugars.  Past Medical History Past Medical History:  Diagnosis Date   ADHD (attention deficit hyperactivity disorder)    Apnea, sleep 06/30/2015   Arthritis    Asthma    Asthma with acute exacerbation 06/30/2015   Bipolar 1 disorder (HCC)    Chronic pain    COPD (chronic obstructive pulmonary disease) ([place]) 11/2018   **LUNG SOUNDS WHEEZING AND WITH RALES**   COPD, moderate ([place]) 11/18/2013   Overview:  Last Assessment & Plan:  Refer to pulmonologist Overview:  Overview:  Last Assessment & Plan:  Pneumonia vaccine offered and given today; she did not tolerate LABA; will start her on Spiriva; stop combivent neb; use only SABA neb OR inhaler, not both; smoking cessation urged, and I am here to help if she wants assistance quitting; f/u in 4 weeks Last Assessment & Plan:  Pneumonia vaccin   Depression    Dyspnea    with exertion   GERD (gastroesophageal reflux disease) 08/30/2015   Gout 08/16/2014   History of acute myocardial infarction 06/30/2015   Overview:  [facility]    HPV (human papilloma virus) infection 07/17/2013   Hyperlipidemia    Low HDL (under 40) 08/30/2015   MI (myocardial infarction) ([place]) 2018   Parkinson disease ([place])    Pre-diabetes    Prediabetes 05/20/2016   PTSD (post-traumatic stress disorder)    history of panic attacks   PTSD (post-traumatic stress disorder)    PTSD (post-traumatic stress disorder)    Restless leg syndrome    Rhabdomyolysis    Sleep apnea 06/30/2015   Stage 3 severe COPD by GOLD classification ([place])  11/18/2013   Stroke ([place]) 2018   Tremors of nervous system    hands   Uncomplicated asthma 02/28/7515   Varicella 02/25/2017      Past Surgical History:  Procedure Laterality Date   BACK SURGERY     CATARACT EXTRACTION W/PHACO Right 01/01/2019   Procedure: CATARACT EXTRACTION PHACO AND INTRAOCULAR LENS PLACEMENT ([place]) right vision blue;  Surgeon: Marchia Meiers, MD;  Location: [facility] ORS;  Service: Ophthalmology;  Laterality: Right;  [place] 00:39.4 CDE 5.49 Fluid Pack lot # [id number]   CATARACT EXTRACTION W/PHACO Left 01/29/2019   Procedure: CATARACT EXTRACTION PHACO AND INTRAOCULAR LENS PLACEMENT ([place]) LEFT Vision Blue;  Surgeon: Marchia Meiers, MD;  Location: [facility];  Service: Ophthalmology;  Laterality: Left;  [place] 00:51.1 CDE 4.38 Fluid Pack Lot # [id number]   COLONOSCOPY WITH PROPOFOL N/A 08/07/2017   Procedure: COLONOSCOPY WITH PROPOFOL;  Surgeon: Virgel Manifold, MD;  Location: [facility];  Service: Endoscopy;  Laterality: N/A;   EYE SURGERY     JOINT REPLACEMENT Left 2016   tkr   REPLACEMENT TOTAL KNEE Left 2016    Allergies  Allergen Reactions   Chantix [Varenicline] Nausea Only    Current Outpatient Medications  Medication Sig Dispense Refill   albuterol (VENTOLIN HFA) 108 (90 Base) MCG/ACT inhaler Inhale 2 puffs into the lungs every 4 (four) hours as needed for wheezing or shortness of breath. 18 g  2   ALPRAZolam (XANAX) 0.5 MG tablet Take 0.5 mg by mouth 4 (four) times daily.     benzonatate (TESSALON) 100 MG capsule Take 2 capsules (200 mg total) by mouth 2 (two) times daily as needed for cough. 20 capsule 0   divalproex (DEPAKOTE) 250 MG DR tablet Take 1 tablet (250 mg total) by mouth 2 (two) times daily. 60 tablet 2   doxycycline (VIBRA-TABS) 100 MG tablet Take 1 tablet (100 mg total) by mouth 2 (two) times daily for 7 days. 14 tablet 0   ipratropium-albuterol (DUONEB) 0.5-2.5 (3) MG/3ML SOLN Take 3 mLs by nebulization 3 (three) times daily as needed. 180 mL 1    meloxicam (MOBIC) 15 MG tablet Take 15 mg by mouth daily.     mometasone-formoterol (DULERA) 100-5 MCG/ACT AERO Inhale 2 puffs into the lungs in the morning and at bedtime. 1 each 3   Multiple Vitamin (MULTIVITAMIN WITH MINERALS) TABS tablet Take 1 tablet by mouth daily.     nystatin ointment (MYCOSTATIN) Apply 1 application topically 2 (two) times daily. 30 g 0   promethazine (PHENERGAN) 25 MG tablet Take 1 tablet (25 mg total) by mouth every 12 (twelve) hours as needed for nausea or vomiting. 30 tablet 0   Tiotropium Bromide Monohydrate (SPIRIVA RESPIMAT) 2.5 MCG/ACT AERS INHHALE TWO PUFFS INTO LUNGS DAILY (Patient taking differently: Inhale 2 puffs into the lungs daily.) 4 g 2   tizanidine (ZANAFLEX) 2 MG capsule Take 2-4 mg by mouth 3 (three) times daily as needed for muscle spasms.     tiZANidine (ZANAFLEX) 4 MG tablet Take 1 tablet (4 mg total) by mouth 3 (three) times daily as needed for muscle spasms. 30 tablet 0   atorvastatin (LIPITOR) 40 MG tablet Take 1 tablet (40 mg total) by mouth at bedtime. 30 tablet 2   escitalopram (LEXAPRO) 20 MG tablet Take 1 tablet (20 mg total) by mouth daily. 30 tablet 2   Lurasidone HCl (LATUDA) 60 MG TABS Take 1 tablet (60 mg total) by mouth daily. 30 tablet 2   nicotine (NICODERM CQ - DOSED IN MG/24 HOURS) 21 mg/24hr patch Place 1 patch (21 mg total) onto the skin daily. 45 patch 0   No current facility-administered medications for this visit.    Family History Family History  Problem Relation Age of Onset   Hernia Mother    Heart disease Mother    OCD Mother    Diabetes Mother    Parkinson's disease Father    Bipolar disorder Sister    Schizophrenia Sister    ADD / ADHD Sister    Alcohol abuse Brother    Bipolar disorder Sister    Paranoid behavior Sister    ADD / ADHD Sister    ADD / ADHD Son    Dementia Maternal Grandmother    Emphysema Maternal Grandfather    ADD / ADHD Son    ADD / ADHD Son    Depression Son       Social  History Social History   Tobacco Use   Smoking status: Every Day    Packs/day: 0.50    Years: 36.00    Pack years: 18.00    Types: Cigarettes    Passive exposure: Past   Smokeless tobacco: Never   Tobacco comments:    Pt using nicotine patches; 1/2 PPD as of 03/21/21  Vaping Use   Vaping Use: Former  Substance Use Topics   Alcohol use: No    Alcohol/week: 0.0 standard drinks  Drug use: No        Review of Systems  Constitutional:  Positive for malaise/fatigue.  HENT: Negative.    Eyes: Negative.   Respiratory:  Positive for cough, shortness of breath and wheezing.   Cardiovascular:  Positive for chest pain.  Gastrointestinal: Negative.   Genitourinary: Negative.   Skin:  Positive for rash.  Neurological:  Positive for headaches.  Psychiatric/Behavioral:  Positive for depression.      Physical Exam Blood pressure 126/84, pulse 86, temperature 98.3 F (36.8 C), height 5\' 1"  (1.549 m), weight 231 lb (104.8 kg), SpO2 92 %. Last Weight  Most recent update: 04/20/2021  2:24 PM    Weight  104.8 kg (231 lb)             CONSTITUTIONAL: Well developed, and morbidly obese, appropriately responsive and aware without distress.   EYES: Sclera non-icteric.   EARS, NOSE, MOUTH AND THROAT: Mask worn.    Hearing is intact to voice.  NECK: Trachea is midline, and there is no jugular venous distension.  LYMPH NODES:  Lymph nodes in the neck are not enlarged. RESPIRATORY:  Lungs are clear, and breath sounds are equal bilaterally. Normal respiratory effort without pathologic use of accessory muscles. CARDIOVASCULAR: Heart is regular in rate and rhythm. GI: The abdomen is  soft, nontender, and nondistended. There were no palpable masses.  MUSCULOSKELETAL:  Symmetrical muscle tone appreciated in all four extremities.    SKIN: Skin turgor is normal. No pathologic skin lesions appreciated.  On the lateral aspect of her left breast there is exquisitely tender rather focal abscess of  about a centimeter in diameter with yellow center.  Surrounding erythema present. In the axilla there is a small area, which appears erythematous, indurated and without significant cellulitis around it however it is also exquisitely tender. NEUROLOGIC:  Motor and sensation appear grossly normal.  Cranial nerves are grossly without defect. PSYCH:  Alert and oriented to person, place and time. Affect is appropriate for situation.  Data Reviewed I have personally reviewed what is currently available of the patient's imaging, recent labs and medical records.   Labs:  CBC Latest Ref Rng & Units 04/15/2021 01/01/2021 12/31/2020  WBC 4.0 - 10.5 K/uL 9.9 9.8 13.9(H)  Hemoglobin 12.0 - 15.0 g/dL 13.3 12.9 12.9  Hematocrit 36.0 - 46.0 % 39.4 39.0 38.9  Platelets 150 - 400 K/uL 242 251 234   CMP Latest Ref Rng & Units 04/15/2021 01/05/2021 01/04/2021  Glucose 70 - 99 mg/dL 95 102(H) -  BUN 8 - 23 mg/dL 11 15 -  Creatinine 0.44 - 1.00 mg/dL 0.96 0.92 -  Sodium 135 - 145 mmol/L 133(L) 131(L) -  Potassium 3.5 - 5.1 mmol/L 4.0 4.5 5.0  Chloride 98 - 111 mmol/L 93(L) 89(L) -  CO2 22 - 32 mmol/L 32 35(H) -  Calcium 8.9 - 10.3 mg/dL 9.3 9.1 -  Total Protein 6.5 - 8.1 g/dL - - -  Total Bilirubin 0.3 - 1.2 mg/dL - - -  Alkaline Phos 38 - 126 U/L - - -  AST 15 - 41 U/L - - -  ALT 0 - 44 U/L - - -     Imaging:  Within last 24 hrs: No results found.  Assessment    Abscesses left breast and axilla. Patient Active Problem List   Diagnosis Date Noted   COPD with acute exacerbation ([place]) 01/01/2021   Hyperglycemia 01/01/2021   Prolonged QT interval 01/01/2021   Benign neoplasm of ascending colon  Polyp of sigmoid colon    Bipolar disorder with depression ([place]) 02/25/2017   ADD (attention deficit disorder) 02/25/2017   Marijuana use 02/13/2017   Neuropathy, peripheral 05/07/2016   Vitamin B12 deficiency 10/24/2015   Fibromyalgia 08/30/2015   Low HDL (under 40) 08/30/2015   GERD (gastroesophageal  reflux disease) 08/30/2015   OP (osteoporosis) 06/30/2015   Sleep apnea 06/30/2015   Morbid (severe) obesity due to excess calories ([place]) 08/16/2014   Low back pain with sciatica 08/04/2014   Current tobacco use 08/04/2014   Polysubstance abuse ([place]) 07/04/2014   Psychoactive substance abuse ([place]) 07/04/2014   Anxiety, generalized 11/24/2013   COPD with asthma ([place]) 11/18/2013   Arthritis of knee, degenerative 07/15/2013   Osteoarthritis of knee, unspecified 07/15/2013    Plan    Incision and drainage, left lateral breast and axillary abscesses.  After informed consent was obtained the patient's left breast and axilla were prepped and draped in usual sterile manner using ChloraPrep.  Local infiltration with 1% lidocaine with epinephrine utilized to adequate anesthetic effect.  The lateral breast site was incised initially, an elliptical incision to remove the overlying roof was completed.  Purulent drainage was obtained.  The Q-tip was probed to a depth of 2 cm with attaining more purulent drainage.  Cultures were collected and sent. Like manner the same incision was done to the axillary focus, there was not nearly the purulent cavity present in the axilla fortunately.  The breast wound was subsequently irrigated and packed with half-inch iodoform packing strip.  Instructions given to the patient to continue the dressing changes, she may remove the dressing and shower on a daily basis and replace the dressing. We will change her antibiotics over to Bactrim. We will have her follow-up in a week.  Face-to-face time spent with the patient and accompanying care providers(if present) was 30 minutes, with more than 50% of the time spent counseling, educating, and coordinating care of the patient.    These notes generated with voice recognition software. I apologize for typographical errors.  Ronny Bacon M.D., FACS 04/20/2021, 2:35 PM

## 2021-04-24 ENCOUNTER — Telehealth: Payer: Self-pay

## 2021-04-24 ENCOUNTER — Ambulatory Visit: Payer: Self-pay | Admitting: *Deleted

## 2021-04-24 MED ORDER — DOXYCYCLINE HYCLATE 100 MG PO CAPS: 100.0000 mg | ORAL_CAPSULE | Freq: Two times a day (BID) | ORAL | 0 refills | Status: AC

## 2021-04-24 NOTE — Telephone Encounter (Signed)
Reason for Disposition  [1] Caller has URGENT medicine question about med that PCP or specialist prescribed AND [2] triager unable to answer question  Answer Assessment - Initial Assessment Questions 1. NAME of MEDICATION: "What medicine are you calling about?"     Ibuprofen and antibiotic 2. QUESTION: "What is your question?" (e.g., double dose of medicine, side effect)     Patient is not sure what medication is causing her stomach upset 3. PRESCRIBING HCP: "Who prescribed it?" Reason: if prescribed by specialist, call should be referred to that group.     PCP/general surgeon 4. SYMPTOMS: "Do you have any symptoms?"     Nausea, diarrhea 5. SEVERITY: If symptoms are present, ask "Are they mild, moderate or severe?"     moderate  Patient states she has sensitive stomach and believes the ibuprofen is upsetting her system. Patient states she has never had a reaction to antibiotic.Patient would like to stop the ibuprofen and see if she gets better- advised would send her message to PCP for advised on pain control.  Protocols used: Medication Question Call-A-AH

## 2021-04-24 NOTE — Telephone Encounter (Signed)
°  Chief Complaint: medication upsetting patient GI Symptoms: nausea, diarrhea, stomach discomfort Frequency:   Pertinent Negatives: Patient denies vomiting Disposition: [] ED /[] Urgent Care (no appt availability in office) / [] Appointment(In office/virtual)/ []  [place] [facility]/ [] Home Care/ [] Refused Recommended Disposition /[] [place] [facility]/ []  Follow-up with PCP Additional Notes: Patient is stating she is having stomach upset due to Ibuprofen- she is also on antibiotic. Patient would like to know if she can stop the ibuprofen. Advised Tylenol may be option- will send message to provider

## 2021-04-24 NOTE — Telephone Encounter (Signed)
Pt was last seen by you Dr.Andrews any advice?

## 2021-04-24 NOTE — Telephone Encounter (Signed)
Relayed Dr.Andrews message to pt, pt understood and will contact Surgery department.

## 2021-04-24 NOTE — Telephone Encounter (Signed)
Patient called and states that she has developed very bad side effects from her antibiotic, Bactrim, she is having nausea with vomiting and diarrhea with stomach cramping. She would like to see if she could change her antibiotic as she is unable to take this one due to the side effects.  Will notify Dr Christian Mate to see what may be done.

## 2021-04-24 NOTE — Telephone Encounter (Signed)
Spoke with the patient. Dr Ferdinand Cava said we could stop the Bactrim and send in a prescription for Doxycycline BID for 1 week. Patient notified. She states that the surgery sites are doing well and packing has been going well. She will follow up as scheduled.

## 2021-04-25 ENCOUNTER — Other Ambulatory Visit: Payer: Self-pay | Admitting: Family Medicine

## 2021-04-25 DIAGNOSIS — E786 Lipoprotein deficiency: Secondary | ICD-10-CM

## 2021-04-27 ENCOUNTER — Ambulatory Visit: Payer: Medicaid Other | Admitting: Neurology

## 2021-04-29 LAB — ANAEROBIC AND AEROBIC CULTURE

## 2021-05-02 ENCOUNTER — Encounter: Payer: 59 | Admitting: Surgery

## 2021-05-04 ENCOUNTER — Ambulatory Visit (INDEPENDENT_AMBULATORY_CARE_PROVIDER_SITE_OTHER): Payer: 59 | Admitting: Surgery

## 2021-05-04 ENCOUNTER — Encounter: Payer: Self-pay | Admitting: Surgery

## 2021-05-04 ENCOUNTER — Encounter: Payer: 59 | Admitting: Surgery

## 2021-05-04 ENCOUNTER — Other Ambulatory Visit: Payer: Self-pay

## 2021-05-04 VITALS — BP 134/70 | HR 74 | Temp 98.2°F | Ht 61.0 in | Wt 229.8 lb

## 2021-05-04 DIAGNOSIS — N611 Abscess of the breast and nipple: Secondary | ICD-10-CM | POA: Insufficient documentation

## 2021-05-04 DIAGNOSIS — Z22322 Carrier or suspected carrier of Methicillin resistant Staphylococcus aureus: Secondary | ICD-10-CM | POA: Diagnosis not present

## 2021-05-04 NOTE — Patient Instructions (Signed)
Please wash the wound with soap and water daily. Remove the old packing material prior to showering washing the wound well. Rinse well. After your shower place new packing material into the wound bed using a q-tip. Place a dry dressing over the area and secure with tape. Do this daily.  ?

## 2021-05-04 NOTE — Progress Notes (Signed)
Surgical Clinic Progress/Follow-up Note  ? ?HPI:  ?61 y.o. Female presents to clinic for left lateral breast dermal abscess follow-up.  She was unable to have attentive daily wound care over the last week, leaving the packing in place for the last week.   And accordingly it has become significantly tender since then.  The secondary smaller boil, appears to have completely resolved.  She has been tolerating regular diet with +flatus and normal BM's, denies N/V, fever/chills, CP, or SOB.  She has caregivers that will be ensuring her dressings are changed daily from now on. ?We discussed the fact that she has MRSA cultured from this cavity, resistant to everything but linezolid.  She did not tolerate prior antibiotics of Septra and doxycycline.  And she is also suffering from sensitivity to ibuprofen as well. ? ?Review of Systems:  ?Constitutional: denies fever/chills  ?ENT: denies sore throat, hearing problems  ?Respiratory: denies shortness of breath, wheezing  ?Cardiovascular: denies chest pain, palpitations  ?Gastrointestinal: denies abdominal pain, N/V, or diarrhea/and bowel function as per interval history ?Skin: Denies any other rashes or skin discolorations except post-surgical wound. ?Vital Signs:  ?BP 134/70   Pulse 74   Temp 98.2 ?F (36.8 ?C) (Oral)   Ht '5\' 1"'$  (1.549 m)   Wt 229 lb 12.8 oz (104.2 kg)   LMP  (LMP Unknown)   SpO2 92%   BMI 43.42 kg/m?   ? ?Physical Exam:  ?Constitutional:  ?-- Obese body habitus  ?-- Awake, alert, and oriented x3  ?Pulmonary:  ?-- No crackles ?-- Equal breath sounds bilaterally ?-- Breathing non-labored at rest ?Cardiovascular:  ?-- S1, S2 present  ?-- No pericardial rubs  ?Gastrointestinal:  ?-- Soft and non-distended, non-tender/with no tenderness to palpation, no guarding/rebound tenderness ?Musculoskeletal / Integumentary:  ?-- Wounds or skin discoloration: There is a well-defined 1 to 2 cm hyperemic ring about the centimeter diameter sized wound of the upper outer  left breast skin.  I remove the old packing and found no evidence of purulent discharge.  Utilizing a sterile Q-tip I probed it to its depth which is about the Q-tip head size.  The only drainage was bloody.  This is consistent with a well granulated cavity very well defined.  There is no other adjacent breast induration, or erythematous changes of the adjacent skin except for that immediate hyperemic perimeter.   ?-- Extremities: B/L UE and LE FROM, hands and feet warm, no edema  ? ?Laboratory studies: Microbiology culture and sensitivity reviewed. ? ?Imaging: No new pertinent imaging available for review ? ? ?Assessment:  ?61 y.o. yo Female with a problem list including...  ?Patient Active Problem List  ? Diagnosis Date Noted  ? COPD with acute exacerbation ([place]) 01/01/2021  ? Hyperglycemia 01/01/2021  ? Prolonged QT interval 01/01/2021  ? Benign neoplasm of ascending colon   ? Polyp of sigmoid colon   ? Bipolar disorder with depression ([place]) 02/25/2017  ? ADD (attention deficit disorder) 02/25/2017  ? Marijuana use 02/13/2017  ? Neuropathy, peripheral 05/07/2016  ? Vitamin B12 deficiency 10/24/2015  ? Fibromyalgia 08/30/2015  ? Low HDL (under 40) 08/30/2015  ? GERD (gastroesophageal reflux disease) 08/30/2015  ? OP (osteoporosis) 06/30/2015  ? Sleep apnea 06/30/2015  ? Morbid (severe) obesity due to excess calories ([place]) 08/16/2014  ? Low back pain with sciatica 08/04/2014  ? Current tobacco use 08/04/2014  ? Polysubstance abuse ([place]) 07/04/2014  ? Psychoactive substance abuse ([place]) 07/04/2014  ? Anxiety, generalized 11/24/2013  ? COPD with asthma (  [place]) 11/18/2013  ? Arthritis of knee, degenerative 07/15/2013  ? Osteoarthritis of knee, unspecified 07/15/2013  ?  ?presents to clinic for follow-up evaluation of MRSA dermal abscess, progressing well, despite exacerbated local inflammatory changes due to inadequate wound care. ? ?Plan:  ?            - return to clinic 2 weeks or as needed, instructed to call office  if any questions or concerns ? -We discussed the need for oral antibiotics, may be determined on how she improves over the next few days. ?Today we irrigated her wound with saline solution, and applied plain packing strip of half-inch with.  She has once again reviewed that changing her dressing must be done daily, and after showering where she allows the water to run past her wound. ?All of the above recommendations were discussed with the patient, and all of patient's questions were answered to her expressed satisfaction. ? ?These notes generated with voice recognition software. I apologize for typographical errors. ? ?Ronny Bacon, MD, FACS ?[place] ?General Surgery - Partnering for exceptional care. ?Office: [phone]  ?

## 2021-05-18 ENCOUNTER — Other Ambulatory Visit: Payer: Self-pay

## 2021-05-18 ENCOUNTER — Encounter: Payer: Self-pay | Admitting: Surgery

## 2021-05-18 ENCOUNTER — Ambulatory Visit (INDEPENDENT_AMBULATORY_CARE_PROVIDER_SITE_OTHER): Payer: 59 | Admitting: Surgery

## 2021-05-18 ENCOUNTER — Telehealth: Payer: Self-pay

## 2021-05-18 VITALS — BP 143/79 | HR 67 | Temp 97.9°F | Ht 61.0 in | Wt 228.0 lb

## 2021-05-18 DIAGNOSIS — Z22322 Carrier or suspected carrier of Methicillin resistant Staphylococcus aureus: Secondary | ICD-10-CM | POA: Diagnosis not present

## 2021-05-18 DIAGNOSIS — L02219 Cutaneous abscess of trunk, unspecified: Secondary | ICD-10-CM

## 2021-05-18 DIAGNOSIS — L03319 Cellulitis of trunk, unspecified: Secondary | ICD-10-CM | POA: Diagnosis not present

## 2021-05-18 MED ORDER — DOXYCYCLINE HYCLATE 100 MG PO CAPS: 100.0000 mg | ORAL_CAPSULE | Freq: Two times a day (BID) | ORAL | 0 refills | Status: DC

## 2021-05-18 NOTE — Progress Notes (Signed)
Surgical Clinic Progress/Follow-up Note  ? ?HPI:  ?61 y.o. Female presents to clinic for left lateral breast dermal abscess follow-up.   ?The primary lesion in follow-up appears to have healed.  She reports however that a couple new areas of tenderness in the axilla on the left and in the axilla on the right have developed.  She has been off doxycycline for a while, and is concerned why these keep recurring. ?We discussed the fact that she has MRSA cultured from this cavity, resistant to everything but linezolid.  She did not tolerate prior antibiotics of Septra, but reports tolerance of doxycycline.  And she is also suffering from sensitivity to ibuprofen as well. ? ?Review of Systems:  ?Constitutional: denies fever/chills  ?ENT: denies sore throat, hearing problems  ?Respiratory: denies shortness of breath, wheezing  ?Cardiovascular: denies chest pain, palpitations  ?Gastrointestinal: denies abdominal pain, N/V, or diarrhea/and bowel function as per interval history ?Skin: Denies any other rashes or skin discolorations except post-surgical wound. ?Vital Signs:  ?BP (!) 143/79   Pulse 67   Temp 97.9 ?F (36.6 ?C) (Oral)   Ht '5\' 1"'$  (1.549 m)   Wt 228 lb (103.4 kg)   LMP  (LMP Unknown)   SpO2 96%   BMI 43.08 kg/m?   ? ?Physical Exam:  ?Constitutional:  ?-- Obese body habitus  ?-- Awake, alert, and oriented x3  ?Pulmonary:  ?-- No crackles ?-- Equal breath sounds bilaterally ?-- Breathing non-labored at rest ?Cardiovascular:  ?-- S1, S2 present  ?-- No pericardial rubs  ?Gastrointestinal:  ?-- Soft and non-distended, non-tender/with no tenderness to palpation, no guarding/rebound tenderness ?Musculoskeletal / Integumentary:  ?-- Wounds or skin discoloration: The prior wound of the upper outer left breast skin, has since healed without evidence of residual.  There are 2 areas 1 in each axilla that is quite superficial and small consistent with a minute pustule.  Just enough pink/erythema there to identify it.   Feels quite superficial.  Yet mildly tender. ?-- Extremities: B/L UE and LE FROM, hands and feet warm, no edema  ? ?Laboratory studies: Microbiology culture and sensitivity reviewed. ? ?Imaging: No new pertinent imaging available for review ? ? ?Assessment:  ?61 y.o. yo Female with a problem list including...  ?Patient Active Problem List  ? Diagnosis Date Noted  ? MRSA (methicillin resistant staph aureus) culture positive 05/04/2021  ? Breast abscess of female 05/04/2021  ? COPD with acute exacerbation ([place]) 01/01/2021  ? Hyperglycemia 01/01/2021  ? Prolonged QT interval 01/01/2021  ? Benign neoplasm of ascending colon   ? Polyp of sigmoid colon   ? Bipolar disorder with depression ([place]) 02/25/2017  ? ADD (attention deficit disorder) 02/25/2017  ? Marijuana use 02/13/2017  ? Neuropathy, peripheral 05/07/2016  ? Vitamin B12 deficiency 10/24/2015  ? Fibromyalgia 08/30/2015  ? Low HDL (under 40) 08/30/2015  ? GERD (gastroesophageal reflux disease) 08/30/2015  ? OP (osteoporosis) 06/30/2015  ? Sleep apnea 06/30/2015  ? Morbid (severe) obesity due to excess calories ([place]) 08/16/2014  ? Low back pain with sciatica 08/04/2014  ? Current tobacco use 08/04/2014  ? Polysubstance abuse ([place]) 07/04/2014  ? Psychoactive substance abuse ([place]) 07/04/2014  ? Anxiety, generalized 11/24/2013  ? COPD with asthma ([place]) 11/18/2013  ? Arthritis of knee, degenerative 07/15/2013  ? Osteoarthritis of knee, unspecified 07/15/2013  ?  ?presents to clinic for follow-up evaluation of history of MRSA dermal abscess.  Now with 2 new areas in each axilla that concern her. ?Plan:  ?            -  return to clinic 3 weeks or as needed, instructed to call office if any questions or concerns ? -We discussed the role for oral antibiotics, warm compresses and twice daily showering/cleansing of the axillary skin.  We asked her to follow-up with [place] should she worsen. ? ?All of the above recommendations were discussed with the patient, and all of patient's  questions were answered to her expressed satisfaction. ? ?These notes generated with voice recognition software. I apologize for typographical errors. ? ?Ronny Bacon, MD, FACS ?[place]: [facility] ?General Surgery - Partnering for exceptional care. ?Office: [phone]  ?

## 2021-05-18 NOTE — Patient Instructions (Addendum)
Apply warm compresses to the area as many times as you want. Allow warm water to flow over the area in the shower. As warm as you can handle as long as you do not burn yourself.  ?Please take the antibiotic for one month.  ?

## 2021-05-18 NOTE — Telephone Encounter (Signed)
Called pt to inform her she needs a follow-up appointment per [facility] based on her recent Housecall visit summary. Pt stated she will get her self scheduled. ?

## 2021-06-05 ENCOUNTER — Ambulatory Visit: Payer: 59 | Admitting: Family Medicine

## 2021-06-06 ENCOUNTER — Ambulatory Visit (INDEPENDENT_AMBULATORY_CARE_PROVIDER_SITE_OTHER): Payer: 59 | Admitting: Family Medicine

## 2021-06-06 ENCOUNTER — Encounter: Payer: Self-pay | Admitting: Family Medicine

## 2021-06-06 VITALS — BP 122/64 | HR 96 | Temp 97.7°F | Resp 16 | Ht 61.0 in | Wt 232.7 lb

## 2021-06-06 DIAGNOSIS — G609 Hereditary and idiopathic neuropathy, unspecified: Secondary | ICD-10-CM | POA: Diagnosis not present

## 2021-06-06 DIAGNOSIS — M5441 Lumbago with sciatica, right side: Secondary | ICD-10-CM

## 2021-06-06 DIAGNOSIS — G8929 Other chronic pain: Secondary | ICD-10-CM

## 2021-06-06 DIAGNOSIS — F319 Bipolar disorder, unspecified: Secondary | ICD-10-CM

## 2021-06-06 DIAGNOSIS — J449 Chronic obstructive pulmonary disease, unspecified: Secondary | ICD-10-CM | POA: Diagnosis not present

## 2021-06-06 DIAGNOSIS — N611 Abscess of the breast and nipple: Secondary | ICD-10-CM

## 2021-06-06 DIAGNOSIS — M179 Osteoarthritis of knee, unspecified: Secondary | ICD-10-CM

## 2021-06-06 DIAGNOSIS — Z22322 Carrier or suspected carrier of Methicillin resistant Staphylococcus aureus: Secondary | ICD-10-CM

## 2021-06-06 MED ORDER — METOCLOPRAMIDE HCL 10 MG PO TABS: 10.0000 mg | ORAL_TABLET | Freq: Once | ORAL | 0 refills | Status: DC

## 2021-06-06 MED ORDER — LURASIDONE HCL 80 MG PO TABS
80.0000 mg | ORAL_TABLET | Freq: Every day | ORAL | 0 refills | Status: DC
Start: 2021-06-06 — End: 2022-07-10

## 2021-06-06 NOTE — Patient Instructions (Signed)
It was great to see you! ? ?Our plans for today:  ?- Your abscess site looks great! Continue your antibiotics. ?- Take the walker prescription to the medical supply store for your walker. ?- We increased your latuda, make sure to follow up with your psychiatrist soon.  ?- take the metoclopramide for your headache. Make sure not to take tylenol and ibuprofen so frequently.  ? ?Take care and seek immediate care sooner if you develop any concerns.  ? ?Dr. Ky Barban ? ?

## 2021-06-06 NOTE — Progress Notes (Signed)
? ?SUBJECTIVE:  ? ?CHIEF COMPLAINT / HPI:  ? ?Cellulitis, abscess ?- abscess near L breast, 2/23 underwent I&D with surgery, wound culture +MRSA.  ?- had f/u with surgery 3/23 with improvement but few additional areas of redness and tenderness in b/l axilla, rx 30 days of doxycycline. ?- still with some tenderness and redness in b/l axilla but improving. Initial abscess site well healed. No fevers.  ? ?Anxiety, Bipolar d/o ?- Medications: lexapro, latuda, depakote, xanax prn ?- followed by Psychiatry, Neurology. Last appt with Psych was last week. Seeing therapy every Friday. Last saw neurology 01/2021 for tremor, referred for second opinion 01/2021. ?- Taking: xanax usually BID.  ?- Counseling: yes ?- Previous hospitalizations: no ?- Symptoms: down, tired ?- Current stressors: family stress, weather changes ? ? ?  06/06/2021  ?  2:21 PM 04/20/2021  ?  1:38 PM 03/21/2021  ?  2:30 PM  ?Depression screen PHQ 2/9  ?Decreased Interest '3 1 3  '$ ?Down, Depressed, Hopeless '3 1 3  '$ ?PHQ - 2 Score '6 2 6  '$ ?Altered sleeping '2 1 2  '$ ?Tired, decreased energy '2 1 3  '$ ?Change in appetite '2 1 1  '$ ?Feeling bad or failure about yourself  2 1 0  ?Trouble concentrating 3 0 3  ?Moving slowly or fidgety/restless 0 0 3  ?Suicidal thoughts 0 0 0  ?PHQ-9 Score '17 6 18  '$ ?Difficult doing work/chores Extremely dIfficult Somewhat difficult Not difficult at all  ? ? ?  06/06/2021  ?  2:22 PM 01/11/2021  ?  2:55 PM 07/06/2020  ?  3:04 PM 04/01/2020  ?  9:55 AM  ?GAD 7 : Generalized Anxiety Score  ?Nervous, Anxious, on Edge '2 3 1 '$ 0  ?Control/stop worrying '3 3 1 1  '$ ?Worry too much - different things '3 3 1 '$ 0  ?Trouble relaxing '3 3 1 1  '$ ?Restless 3 0 1 0  ?Easily annoyed or irritable 0 2 1 0  ?Afraid - awful might happen '3 2 1 '$ 0  ?Total GAD 7 Score '17 16 7 2  '$ ?Anxiety Difficulty Extremely difficult Somewhat difficult Somewhat difficult Not difficult at all  ? ?HEADACHE ?Duration:  week ?Quality: sharp ?Frequency: intermittent, current headache constant for a  week. ?Location: frontal ?Radiation: no ?Alleviating factors: nothing ?Aggravating factors: nothing ?Headache status at time of visit: current headache ?Treatments attempted: tylenol and ibuprofen scheduled q6-8h for the past week, phenergan   ?Nausea:  yes ?Vomiting: no ?Photophobia:  no ?Phonophobia:  yes ?Confusion:  no ?Gait disturbance/ataxia:  no ?Behavioral changes:  no ?Fevers:  no ? ? ? ?OBJECTIVE:  ? ?BP 122/64   Pulse 96   Temp 97.7 ?F (36.5 ?C) (Oral)   Resp 16   Ht '5\' 1"'$  (1.549 m)   Wt 232 lb 11.2 oz (105.6 kg)   LMP  (LMP Unknown)   SpO2 95%   BMI 43.97 kg/m?   ?Gen: well appearing, in NAD ?Card: RRR ?Lungs: CTAB, wheezing throughout ?Ext: WWP, no edema ?MSK: Full ROM, strength 5/5 to U/LE bilaterally, normal gait.  No edema.  ?Neuro: Alert and oriented, speech normal.  Optic field normal. PERRL, Extraocular movements intact.  Intact symmetric sensation to light touch of face and extremities bilaterally.  Hearing grossly intact bilaterally.  Tongue protrudes normally with no deviation.  Shoulder shrug, smile symmetric. Finger to nose normal. ? ? ?ASSESSMENT/PLAN:  ? ?COPD with asthma ([place]) ?With wheezing on exam today, otherwise comfortable WOB and appropriately saturated. Reports has not been compliant with inhaler,  encouraged compliance. ? ?Bipolar disorder with depression ([place]) ?Chronic, worsened. No SI. Increase latuda. Maintain f/u with Psych. ? ?Breast abscess of female ?Improved. F/u after completion of antibiotics.  ?  ?Headache ?H/o tension headache, likely element of medication overuse as well. Normal neuro exam. Will give abortive therapy, cautioned on judicious use of OTC pain relief. ? ?Myles Gip, DO ?

## 2021-06-07 NOTE — Assessment & Plan Note (Signed)
Improved. F/u after completion of antibiotics.  ?

## 2021-06-07 NOTE — Assessment & Plan Note (Signed)
Chronic, worsened. No SI. Increase latuda. Maintain f/u with Psych. ?

## 2021-06-07 NOTE — Assessment & Plan Note (Signed)
With wheezing on exam today, otherwise comfortable WOB and appropriately saturated. Reports has not been compliant with inhaler, encouraged compliance. ?

## 2021-06-13 ENCOUNTER — Encounter: Payer: Self-pay | Admitting: Family Medicine

## 2021-06-13 ENCOUNTER — Other Ambulatory Visit: Payer: Self-pay

## 2021-06-13 ENCOUNTER — Ambulatory Visit: Payer: Self-pay | Admitting: *Deleted

## 2021-06-13 ENCOUNTER — Telehealth (INDEPENDENT_AMBULATORY_CARE_PROVIDER_SITE_OTHER): Payer: 59 | Admitting: Family Medicine

## 2021-06-13 DIAGNOSIS — N611 Abscess of the breast and nipple: Secondary | ICD-10-CM | POA: Diagnosis not present

## 2021-06-13 DIAGNOSIS — K219 Gastro-esophageal reflux disease without esophagitis: Secondary | ICD-10-CM

## 2021-06-13 MED ORDER — PANTOPRAZOLE SODIUM 40 MG PO TBEC: 40.0000 mg | DELAYED_RELEASE_TABLET | Freq: Every day | ORAL | 0 refills | Status: DC

## 2021-06-13 NOTE — Assessment & Plan Note (Signed)
With few nodular areas of induration and redness in b/l axilla not improved with prolonged antibiotics and warm compresses. Ok to discontinue antibiotics given above and not much improvement despite prolonged course. May require surgical excision given previous I&D. Exam limited due to virtual format.  ?

## 2021-06-13 NOTE — Telephone Encounter (Signed)
?  Chief Complaint: abd pain/burning ?Symptoms: some diarrhea,some vomiting ?Frequency: pain almost constant ?Pertinent Negatives: Patient denies fever ?Disposition: '[]'$ ED /'[]'$ Urgent Care (no appt availability in office) / '[x]'$ Appointment(In office/virtual)/ '[]'$  [place] [facility]/ '[]'$ Home Care/ '[]'$ Refused Recommended Disposition /'[]'$ [place] [facility]/ '[]'$  Follow-up with PCP ?Additional Notes: Pt states she mentioned this to Dr. Ky Barban at last weeks appt. Pt states that is started after starting Doxycycline given by Dr. Christian Mate. It is a month long regimen with only 5 days left. Virtual appt made for this afternoon since pt just seen last in person last week and has had no changes. ? ?Reason for Disposition ? [1] MODERATE pain (e.g., interferes with normal activities) AND [2] pain comes and goes (cramps) AND [3] present > 24 hours  (Exception: pain with Vomiting or Diarrhea - see that Guideline) ? ?Answer Assessment - Initial Assessment Questions ?1. LOCATION: "Where does it hurt?"  ?    Lower abdomen ?2. RADIATION: "Does the pain shoot anywhere else?" (e.g., chest, back) ?    No, but does cramp, bm everyday ?3. ONSET: "When did the pain begin?" (e.g., minutes, hours or days ago)  ?    Several weeks, is an antibiotic for something else. ?4. SUDDEN: "Gradual or sudden onset?" ?    unsure ?5. PATTERN "Does the pain come and go, or is it constant?" ?   - If constant: "Is it getting better, staying the same, or worsening?"  ?    (Note: Constant means the pain never goes away completely; most serious pain is constant and it progresses)  ?   - If intermittent: "How long does it last?" "Do you have pain now?" ?    (Note: Intermittent means the pain goes away completely between bouts) ?    constanbt ?6. SEVERITY: "How bad is the pain?"  (e.g., Scale 1-10; mild, moderate, or severe) ?  - MILD (1-3): doesn't interfere with normal activities, abdomen soft and not tender to touch  ?  - MODERATE (4-7): interferes with normal  activities or awakens from sleep, abdomen tender to touch  ?  - SEVERE (8-10): excruciating pain, doubled over, unable to do any normal activities  ?    9 ?7. RECURRENT SYMPTOM: "Have you ever had this type of stomach pain before?" If Yes, ask: "When was the last time?" and "What happened that time?"  ?    Right now, mouth waterying like need to vomit ?8. CAUSE: "What do you think is causing the stomach pain?" ?    Maybe antibiotic ?9. RELIEVING/AGGRAVATING FACTORS: "What makes it better or worse?" (e.g., movement, antacids, bowel movement) ?    None, taking phenergan, antacids, not helping ?10. OTHER SYMPTOMS: "Do you have any other symptoms?" (e.g., back pain, diarrhea, fever, urination pain, vomiting) ?      Vomiting, diarrhea twice a day ?11. PREGNANCY: "Is there any chance you are pregnant?" "When was your last menstrual period?" ?      na ? ?Protocols used: Abdominal Pain - Female-A-AH ? ?

## 2021-06-13 NOTE — Assessment & Plan Note (Signed)
Worsened with recent prolonged doxycycline use. Given few days of antibiotic course left and not much improvement, ok to discontinue use. Recommend stop NSAID. Will send protonix. No red flags to concern for ulcer, malignant process. F/u in person in a few weeks if no better. ?

## 2021-06-13 NOTE — Progress Notes (Signed)
Virtual Visit via Video Note ? ?I connected with Nicole Austin on 06/13/21 at  9:20 AM EDT by a video enabled telemedicine application and verified that I am speaking with the correct person using two identifiers. ? ?Location: ?Patient: home ?Provider: [facility] ?  ?I discussed the limitations of evaluation and management by telemedicine and the availability of in person appointments. The patient expressed understanding and agreed to proceed. ? ?History of Present Illness: ? ?ABDOMINAL ISSUES ?- h/o abscess near L breast, 2/23 underwent I&D with surgery, wound culture +MRSA.  ?- had f/u with surgery 3/23 with improvement but few additional areas of redness and tenderness in b/l axilla, rx 30 days of doxycycline. Putting warm compresses on axilla areas without much improvement.  ?- has had some nausea since starting doxycycline, getting worse. Now with abd pain, diffuse. ?- eating helps, has gained some weight. ?- h/o GERD, not currently on treatment ? ?Duration:  few weeks ?Nature: burning ?Location: diffuse  ?Radiation: no ?Alleviating factors: eating ?Aggravating factors: ibuprofen ?Treatments attempted: phenergan, zofran, tylenol, ibuprofen, tums ?Constipation: no ?Diarrhea:  some ?Episodes of diarrhea/day: 1-2 ?Nausea: yes ?Vomiting: yes ?Episodes of vomit/day: 2, not every day ?Melena or hematochezia: no ?Fever: no ?Weight loss: no ?  ? ?Observations/Objective: ? ?Well appearing, in NAD. Speaks in full sentences. Comfortable WOB on RA. No resp distress.  ? ? ?Assessment and Plan: ? ?Problem List Items Addressed This Visit   ? ?  ? Digestive  ? GERD (gastroesophageal reflux disease) - Primary  ?  Worsened with recent prolonged doxycycline use. Given few days of antibiotic course left and not much improvement, ok to discontinue use. Recommend stop NSAID. Will send protonix. No red flags to concern for ulcer, malignant process. F/u in person in a few weeks if no better. ? ?  ?  ? Relevant Medications  ?  pantoprazole (PROTONIX) 40 MG tablet  ?  ? Other  ? Breast abscess of female  ?  With few nodular areas of induration and redness in b/l axilla not improved with prolonged antibiotics and warm compresses. Ok to discontinue antibiotics given above and not much improvement despite prolonged course. May require surgical excision given previous I&D. Exam limited due to virtual format.  ? ?  ?  ? ? ? ?  ?I discussed the assessment and treatment plan with the patient. The patient was provided an opportunity to ask questions and all were answered. The patient agreed with the plan and demonstrated an understanding of the instructions. ?  ?The patient was advised to call back or seek an in-person evaluation if the symptoms worsen or if the condition fails to improve as anticipated. ? ?I provided 12 minutes of non-face-to-face time during this encounter. ? ? ?Myles Gip, DO ?

## 2021-06-26 ENCOUNTER — Telehealth: Payer: Self-pay

## 2021-06-26 ENCOUNTER — Other Ambulatory Visit: Payer: Self-pay | Admitting: Internal Medicine

## 2021-06-26 DIAGNOSIS — B49 Unspecified mycosis: Secondary | ICD-10-CM

## 2021-06-26 NOTE — Progress Notes (Signed)
? ? ?Chronic Care Management ?Pharmacy Assistant  ? ?Name: Nicole Austin  MRN: [id number] DOB: Oct 15, 1960 ? ?Reason for Encounter:COPD Disease State Call. ? ?Recent office visits:  ?06/13/2021 Rory Percy DO (PCP Office)  start Pantoprazole 40 mg daily ?06/06/2021 Rory Percy DO (PCP Office) Start metoclopramide 10 mg once , Increase Lurasidone HCI to 80 mg daily ?04/20/2021 [name] DO (PCP Office) Start Nystatin 100000 3 times daily, Referral to general surgery  ? ?Recent consult visits:  ?05/18/2021 Dr. Christian Mate MD (General Surgery) start Doxycycline 100 mg 2 times daily,return to clinic 3 weeks  ?05/04/2021 Dr. Christian Mate MD (General Surgery) No medication Changes noted ?04/24/2021  Dr. Christian Mate MD (General Surgery) stop Bactrim, start Doxycycline 100 mg 2 times daily ?04/20/2021 Dr. Christian Mate MD (General Surgery) Start Ibuprofen 600 mg PRN, start BACTRIM 800-160 mg 2 times daily, Follow up 9 days ? ?Hospital visits:  ?Medication Reconciliation was completed by comparing discharge summary, patient?s EMR and Pharmacy list, and upon discussion with patient. ? ?Admitted to the hospital on 04/15/2021 due to Cellulitis. Discharge date was 04/15/2021. Discharged from [facility].   ? ?New?Medications Started at [facility] Discharge:?? ?-started Cephalexin 500 mg 2 times daily ?- Started Doxycycline 100 mg 2 times daily ?- Started Oxycodone 5 mg PRN ? ?Medication Changes at Hospital Discharge: ?-Changed None ID ? ?Medications Discontinued at Hospital Discharge: ?-Stopped None ID ? ?Medications that remain the same after Hospital Discharge:??  ?-All other medications will remain the same.   ? ?Medications: ?Outpatient Encounter Medications as of 06/26/2021  ?Medication Sig Note  ? albuterol (VENTOLIN HFA) 108 (90 Base) MCG/ACT inhaler Inhale 2 puffs into the lungs every 4 (four) hours as needed for wheezing or shortness of breath.   ? ALPRAZolam (XANAX) 0.5 MG tablet Take 0.5 mg  by mouth 4 (four) times daily.   ? atorvastatin (LIPITOR) 40 MG tablet TAKE 1 TABLET BY MOUTH EVERY NIGHT AT BEDTIME   ? benzonatate (TESSALON) 100 MG capsule Take 2 capsules (200 mg total) by mouth 2 (two) times daily as needed for cough. (Patient not taking: Reported on 06/13/2021)   ? divalproex (DEPAKOTE) 250 MG DR tablet Take 1 tablet (250 mg total) by mouth 2 (two) times daily.   ? doxycycline (VIBRAMYCIN) 100 MG capsule Take 1 capsule (100 mg total) by mouth 2 (two) times daily. (Patient not taking: Reported on 06/13/2021)   ? escitalopram (LEXAPRO) 20 MG tablet Take 1 tablet (20 mg total) by mouth daily.   ? ipratropium-albuterol (DUONEB) 0.5-2.5 (3) MG/3ML SOLN Take 3 mLs by nebulization 3 (three) times daily as needed.   ? lurasidone (LATUDA) 80 MG TABS tablet Take 1 tablet (80 mg total) by mouth daily with breakfast.   ? meloxicam (MOBIC) 15 MG tablet Take 15 mg by mouth daily.   ? metoCLOPramide (REGLAN) 10 MG tablet Take 1 tablet (10 mg total) by mouth once for 1 dose.   ? mometasone-formoterol (DULERA) 100-5 MCG/ACT AERO Inhale 2 puffs into the lungs in the morning and at bedtime.   ? Multiple Vitamin (MULTIVITAMIN WITH MINERALS) TABS tablet Take 1 tablet by mouth daily. 04/20/2020: Centrum Silver 50+ Womens   ? nicotine (NICODERM CQ - DOSED IN MG/24 HOURS) 21 mg/24hr patch Place 1 patch (21 mg total) onto the skin daily.   ? nystatin (MYCOSTATIN/NYSTOP) powder Apply 1 application topically 3 (three) times daily.   ? nystatin ointment (MYCOSTATIN) Apply 1 application topically 2 (two) times daily.   ? pantoprazole (PROTONIX) 40 MG  tablet Take 1 tablet (40 mg total) by mouth daily.   ? promethazine (PHENERGAN) 25 MG tablet Take 1 tablet (25 mg total) by mouth every 12 (twelve) hours as needed for nausea or vomiting.   ? Tiotropium Bromide Monohydrate (SPIRIVA RESPIMAT) 2.5 MCG/ACT AERS INHHALE TWO PUFFS INTO LUNGS DAILY (Patient taking differently: Inhale 2 puffs into the lungs daily.)   ? tizanidine  (ZANAFLEX) 2 MG capsule Take 2-4 mg by mouth 3 (three) times daily as needed for muscle spasms.   ? tiZANidine (ZANAFLEX) 4 MG tablet Take 1 tablet (4 mg total) by mouth 3 (three) times daily as needed for muscle spasms.   ? ?No facility-administered encounter medications on file as of 06/26/2021.  ? ? ?Care Gaps: ?COVID-19 Vaccine (4- Booster Moderna Series) ?PAP Smears ?  ?Star Rating Drugs: ?Atorvastatin 40 mg last filled 04/07/2021 90 day supply at [facility]. ?  ?Medication Fill Gaps: ?None ID ? ?Current COPD regimen:  ? ?Ventolin HFA 108 mcg/act 2 puff every 4 hours as needed  ?Duoneb 74m nebulizer three times daily as needed  ?Spiriva 2.5 mcg/act 2 puffs daily  ?Dulera 100-5 MCG/ACT inhale 2 puffs into lungs in the morning and bedtime ?   ? View : No data to display.  ?  ?  ?  ? ?Any recent hospitalizations or ED visits since last visit with CPP? Yes ? ?Reports COPD symptoms, including Increased shortness of breath  and Wheezing ?Patient reports she lives in a small one bedroom apartment, and when she walks from her living room to her bathroom she is out of breath. ? ?What recent interventions/DTPs have been made by any provider to improve breathing since last visit: ?None ID ? ?Have you had exacerbation/flare-up since last visit? Yes ? ?What do you do when you are short of breath?  Rescue medication and Rest ? ?Respiratory Devices/Equipment ?Do you have a nebulizer? Yes ? ?Do you use a Peak Flow Meter? No ? ?Do you use a maintenance inhaler? Yes ? ?How often do you forget to use your daily inhaler?  ?Patient reports she may forget to use her daily inhaler "from time to time". ? ?Do you use a rescue inhaler? Yes ? ?How often do you use your rescue inhaler?  3-5x times per week ? ?Do you use a spacer with your inhaler? No ? ? ?CAT ASSESSMENT  ?Rank each of the following items on a scale of 0 to 5 (with 5 being most severe) Write a # 0-5 in each box  ?I never cough (0) > I cough all the time (5) 5  ?I  have no phlegm (mucus) in my chest (0) > My chest is completely full of phlegm (mucus) (5) 5  ?My chest does not feel tight at all (0) > My chest feels very tight (5) 0  ?When I walk up a hill or one flight of stairs I am not breathless (0) > When I walk up a hill or one flight of stairs I am very breathless (5) 5  ?I am not limited doing any activities at home (0) > I am very limited doing activities at home (5) 5  ?I am confident leaving my home despite my lung function (0) > I am not at all confident leaving my home because of my lung condition (5)  5  ?I sleep soundly (0) > I don't sleep soundly because of my lung condition (5) 5  ?I have lots of energy (0) > I have no  energy at all (5) 5  ? ?Total CAT Score: 35 - Notified clinical pharmacist. ? ?Patient agreed to schedule Telephone follow up appointment with Care management team member  for : 08/16/2021 at 3:45 pm. ? ? ?Adherence Review: ?Does the patient have >5 day gap between last estimated fill date for maintenance inhaler medications? Yes ? ?Bessie Kellihan,CPA ?Clinical Pharmacist Assistant ?[phone]  ? ?

## 2021-06-27 ENCOUNTER — Telehealth (INDEPENDENT_AMBULATORY_CARE_PROVIDER_SITE_OTHER): Payer: 59 | Admitting: Family Medicine

## 2021-06-27 ENCOUNTER — Encounter: Payer: Self-pay | Admitting: Family Medicine

## 2021-06-27 ENCOUNTER — Telehealth: Payer: Self-pay

## 2021-06-27 VITALS — Ht 61.0 in | Wt 225.0 lb

## 2021-06-27 DIAGNOSIS — R32 Unspecified urinary incontinence: Secondary | ICD-10-CM | POA: Diagnosis not present

## 2021-06-27 DIAGNOSIS — Z9981 Dependence on supplemental oxygen: Secondary | ICD-10-CM

## 2021-06-27 DIAGNOSIS — R110 Nausea: Secondary | ICD-10-CM

## 2021-06-27 DIAGNOSIS — K219 Gastro-esophageal reflux disease without esophagitis: Secondary | ICD-10-CM | POA: Diagnosis not present

## 2021-06-27 DIAGNOSIS — R21 Rash and other nonspecific skin eruption: Secondary | ICD-10-CM | POA: Diagnosis not present

## 2021-06-27 DIAGNOSIS — L304 Erythema intertrigo: Secondary | ICD-10-CM

## 2021-06-27 DIAGNOSIS — Z789 Other specified health status: Secondary | ICD-10-CM

## 2021-06-27 DIAGNOSIS — L089 Local infection of the skin and subcutaneous tissue, unspecified: Secondary | ICD-10-CM

## 2021-06-27 DIAGNOSIS — Z7409 Other reduced mobility: Secondary | ICD-10-CM

## 2021-06-27 DIAGNOSIS — Z8614 Personal history of Methicillin resistant Staphylococcus aureus infection: Secondary | ICD-10-CM

## 2021-06-27 MED ORDER — FLUCONAZOLE 150 MG PO TABS: ORAL_TABLET | ORAL | 0 refills | Status: DC

## 2021-06-27 MED ORDER — FAMOTIDINE 20 MG PO TABS: 20.0000 mg | ORAL_TABLET | Freq: Two times a day (BID) | ORAL | 1 refills | Status: DC | PRN

## 2021-06-27 MED ORDER — NYSTATIN 100000 UNIT/GM EX POWD: 1.0000 "application " | Freq: Three times a day (TID) | CUTANEOUS | 1 refills | Status: DC | PRN

## 2021-06-27 NOTE — Telephone Encounter (Signed)
Refilled today at pt's appt. ?Requested Prescriptions  ?Pending Prescriptions Disp Refills  ?? NYSTATIN powder [Pharmacy Med Name: NYSTOP 100000 UNIT/GM TOP POW GM] 15 g 0  ?  Sig: APPLY 1 APPLICATION TOPICALLY 3 TIMES DAILY  ?  ? Off-Protocol Failed - 06/26/2021  9:09 AM  ?  ?  Failed - Medication not assigned to a protocol, review manually.  ?  ?  Passed - Valid encounter within last 12 months  ?  Recent Outpatient Visits   ?      ? Today Intertrigo  ? [facility] Delsa Grana, PA-C  ? 2 weeks ago Gastroesophageal reflux disease, unspecified whether esophagitis present  ? [place], DO  ? 3 weeks ago COPD with asthma [facility])  ? [place], DO  ? 2 months ago Abscess  ? [facility] [name], DO  ? 3 months ago Viral upper respiratory tract infection  ? [facility] Bo Merino, FNP  ?  ?  ?Future Appointments   ?        ? In 8 months [place]   ?  ? ?  ?  ?  ? ? ? ?

## 2021-06-27 NOTE — Telephone Encounter (Signed)
Pt triaged and link sent ?

## 2021-06-27 NOTE — Telephone Encounter (Signed)
Copied from [place]. Topic: Appointment Scheduling - Scheduling Inquiry for Clinic ?>> Jun 27, 2021  9:46 AM Erick Blinks wrote: ?Reason for CRM: Pt called to report that she needs the link sent to her phone to join her appt because she is not technologically savvy she says.  ?Best contact: [phone] ?

## 2021-06-27 NOTE — Progress Notes (Signed)
? ?Name: Nicole Austin   MRN: [id number]    DOB: 1960-09-23   Date:06/27/2021 ? ?     Progress Note ? ?Subjective:  ? ? ?Chief Complaint ? ?Chief Complaint  ?Patient presents with  ? Raw Spot  ?  In groin area, wearing underwear hurts. Nystatin not helping, getting worse.  ? Nausea  ?  Medicine is not helping  ? ? ?I connected with  Tonja Jezewski Doukas on 06/27/21 at 10:40 AM EDT by telephone and verified that I am speaking with the correct person using two identifiers. ?  ?I discussed the limitations, risks, security and privacy concerns of performing an evaluation and management service by telephone and the availability of in person appointments. Staff also discussed with the patient that there may be a patient responsible charge related to this service.  Patient verbalized understanding and agreed to proceed with encounter. ?Patient Location: home ?Provider Location: [facility] clinic office ?Additional Individuals present: none ? ?HPI ?Raw skin to groin -in skin fold she states is very painful irritating and it burns, she has been doing nystatin ointment and powders without improvement ?She reports upon waking its soaking wet she is not sure if it sweat but it smells foul ?She also reports some urinary incontinence -stress incontinence to leaking urine with any cough or sneeze ? ?She has no way to get to the clinic without arranging several days ahead of time transportation ? ?She recently had a breast abscess, positive for MRSA with surgery and difficult follow-up, she started a months worth of antibiotics and afterwards had GI upset and nausea ? ? ? ? ? ? ?Patient Active Problem List  ? Diagnosis Date Noted  ? MRSA (methicillin resistant staph aureus) culture positive 05/04/2021  ? Breast abscess of female 05/04/2021  ? COPD with acute exacerbation ([place]) 01/01/2021  ? Hyperglycemia 01/01/2021  ? Prolonged QT interval 01/01/2021  ? Benign neoplasm of ascending colon   ? Polyp of sigmoid colon   ? Bipolar disorder  with depression ([place]) 02/25/2017  ? ADD (attention deficit disorder) 02/25/2017  ? Marijuana use 02/13/2017  ? Neuropathy, peripheral 05/07/2016  ? Vitamin B12 deficiency 10/24/2015  ? Fibromyalgia 08/30/2015  ? Low HDL (under 40) 08/30/2015  ? GERD (gastroesophageal reflux disease) 08/30/2015  ? OP (osteoporosis) 06/30/2015  ? Sleep apnea 06/30/2015  ? Morbid (severe) obesity due to excess calories ([place]) 08/16/2014  ? Low back pain with sciatica 08/04/2014  ? Current tobacco use 08/04/2014  ? Polysubstance abuse ([place]) 07/04/2014  ? Psychoactive substance abuse ([place]) 07/04/2014  ? Anxiety, generalized 11/24/2013  ? COPD with asthma ([place]) 11/18/2013  ? Arthritis of knee, degenerative 07/15/2013  ? Osteoarthritis of knee, unspecified 07/15/2013  ? ? ?Social History  ? ?Tobacco Use  ? Smoking status: Every Day  ?  Packs/day: 0.50  ?  Years: 36.00  ?  Pack years: 18.00  ?  Types: Cigarettes  ?  Passive exposure: Past  ? Smokeless tobacco: Never  ? Tobacco comments:  ?  Pt using nicotine patches; 1/2 PPD as of 03/21/21  ?Substance Use Topics  ? Alcohol use: No  ?  Alcohol/week: 0.0 standard drinks  ? ? ? ?Current Outpatient Medications:  ?  albuterol (VENTOLIN HFA) 108 (90 Base) MCG/ACT inhaler, Inhale 2 puffs into the lungs every 4 (four) hours as needed for wheezing or shortness of breath., Disp: 18 g, Rfl: 2 ?  ALPRAZolam (XANAX) 0.5 MG tablet, Take 0.5 mg by mouth 4 (four) times daily., Disp: ,  Rfl:  ?  atorvastatin (LIPITOR) 40 MG tablet, TAKE 1 TABLET BY MOUTH EVERY NIGHT AT BEDTIME, Disp: 90 tablet, Rfl: 3 ?  divalproex (DEPAKOTE) 250 MG DR tablet, Take 1 tablet (250 mg total) by mouth 2 (two) times daily., Disp: 60 tablet, Rfl: 2 ?  escitalopram (LEXAPRO) 20 MG tablet, Take 1 tablet (20 mg total) by mouth daily., Disp: 30 tablet, Rfl: 2 ?  ipratropium-albuterol (DUONEB) 0.5-2.5 (3) MG/3ML SOLN, Take 3 mLs by nebulization 3 (three) times daily as needed., Disp: 180 mL, Rfl: 1 ?  lurasidone (LATUDA) 80 MG TABS  tablet, Take 1 tablet (80 mg total) by mouth daily with breakfast., Disp: 90 tablet, Rfl: 0 ?  meloxicam (MOBIC) 15 MG tablet, Take 15 mg by mouth daily., Disp: , Rfl:  ?  metoCLOPramide (REGLAN) 10 MG tablet, Take 1 tablet (10 mg total) by mouth once for 1 dose., Disp: 1 tablet, Rfl: 0 ?  mometasone-formoterol (DULERA) 100-5 MCG/ACT AERO, Inhale 2 puffs into the lungs in the morning and at bedtime., Disp: 1 each, Rfl: 3 ?  Multiple Vitamin (MULTIVITAMIN WITH MINERALS) TABS tablet, Take 1 tablet by mouth daily., Disp: , Rfl:  ?  nicotine (NICODERM CQ - DOSED IN MG/24 HOURS) 21 mg/24hr patch, Place 1 patch (21 mg total) onto the skin daily., Disp: 45 patch, Rfl: 0 ?  nystatin (MYCOSTATIN/NYSTOP) powder, Apply 1 application topically 3 (three) times daily., Disp: 15 g, Rfl: 0 ?  nystatin ointment (MYCOSTATIN), Apply 1 application topically 2 (two) times daily., Disp: 30 g, Rfl: 0 ?  pantoprazole (PROTONIX) 40 MG tablet, Take 1 tablet (40 mg total) by mouth daily., Disp: 90 tablet, Rfl: 0 ?  promethazine (PHENERGAN) 25 MG tablet, Take 1 tablet (25 mg total) by mouth every 12 (twelve) hours as needed for nausea or vomiting., Disp: 30 tablet, Rfl: 0 ?  Tiotropium Bromide Monohydrate (SPIRIVA RESPIMAT) 2.5 MCG/ACT AERS, INHHALE TWO PUFFS INTO LUNGS DAILY (Patient taking differently: Inhale 2 puffs into the lungs daily.), Disp: 4 g, Rfl: 2 ?  tizanidine (ZANAFLEX) 2 MG capsule, Take 2-4 mg by mouth 3 (three) times daily as needed for muscle spasms., Disp: , Rfl:  ?  tiZANidine (ZANAFLEX) 4 MG tablet, Take 1 tablet (4 mg total) by mouth 3 (three) times daily as needed for muscle spasms., Disp: 30 tablet, Rfl: 0 ? ?Allergies  ?Allergen Reactions  ? Chantix [Varenicline] Nausea Only  ? ? ?Chart Review: ?I personally reviewed active problem list, medication list, allergies, family history, social history, health maintenance, notes from last encounter, lab results, imaging with the patient/caregiver today. ? ? ?Review of  Systems  ?Constitutional: Negative.   ?HENT: Negative.    ?Eyes: Negative.   ?Respiratory: Negative.    ?Cardiovascular: Negative.   ?Gastrointestinal: Negative.   ?Endocrine: Negative.   ?Genitourinary: Negative.   ?Musculoskeletal: Negative.   ?Skin: Negative.   ?Allergic/Immunologic: Negative.   ?Neurological: Negative.   ?Hematological: Negative.   ?Psychiatric/Behavioral: Negative.    ?All other systems reviewed and are negative. ? ? ?Objective:  ? ? ?Virtual encounter, vitals limited, only able to obtain the following ?Today's Vitals  ? 06/27/21 0959  ?Weight: 225 lb (102.1 kg)  ?Height: '5\' 1"'$  (1.549 m)  ? ?Body mass index is 42.51 kg/m?Marland Kitchen ?Nursing Note and Vital Signs reviewed. ? ?Physical Exam ?Vitals and nursing note reviewed.  ?Pulmonary:  ?   Effort: No respiratory distress.  ?Neurological:  ?   Mental Status: She is alert.  ?Psychiatric:     ?  Mood and Affect: Mood normal.  ? ? ?PE limited by telephone encounter ? ?No results found for this or any previous visit (from the past 72 hour(s)). ? ?Assessment and Plan:  ? ?  ICD-10-CM   ?1. Intertrigo  L30.4 nystatin (MYCOSTATIN/NYSTOP) powder  ?  fluconazole (DIFLUCAN) 150 MG tablet  ?  Ambulatory referral to Home Health  ? focus on keeping skin dry, airing it out, avoiding skin to skin rubbing and sweating, off label diflucan po x 2weeks with continued topical nystatin powder  ?  ?2. Rash and nonspecific skin eruption  R21 nystatin (MYCOSTATIN/NYSTOP) powder  ?  fluconazole (DIFLUCAN) 150 MG tablet  ?  Ambulatory referral to Home Health  ? likely fungal with hx and recent abx use  ?  ?3. Urinary incontinence, unspecified type  R32 Ambulatory referral to [place]  ? stress possible? with coughing, discussed depends/pads, making sure to get urine away from skin asap, will need in person appt   ?  ?4. Gastroesophageal reflux disease, unspecified whether esophagitis present  K21.9 famotidine (PEPCID) 20 MG tablet  ? GI SE after 1 month of doxy, continue  protonix and can add bid prn pepcid  ?  ?5. Nausea  R11.0 famotidine (PEPCID) 20 MG tablet  ? would avoid the prior antiemetics and use protonix and pepcid (avoid polypharmacy with mutliple neuro/psych meds)  ?  ?6.

## 2021-08-09 ENCOUNTER — Ambulatory Visit: Payer: 59 | Admitting: Family Medicine

## 2021-08-10 ENCOUNTER — Encounter: Payer: Self-pay | Admitting: Family Medicine

## 2021-08-10 ENCOUNTER — Ambulatory Visit (INDEPENDENT_AMBULATORY_CARE_PROVIDER_SITE_OTHER): Payer: 59 | Admitting: Family Medicine

## 2021-08-10 VITALS — BP 132/70 | HR 84 | Temp 98.0°F | Resp 16 | Ht 61.0 in | Wt 236.3 lb

## 2021-08-10 DIAGNOSIS — L02414 Cutaneous abscess of left upper limb: Secondary | ICD-10-CM

## 2021-08-10 DIAGNOSIS — Z8614 Personal history of Methicillin resistant Staphylococcus aureus infection: Secondary | ICD-10-CM

## 2021-08-10 MED ORDER — MUPIROCIN 2 % EX OINT: 1.0000 | TOPICAL_OINTMENT | Freq: Two times a day (BID) | CUTANEOUS | 0 refills | Status: AC

## 2021-08-10 MED ORDER — DOXYCYCLINE HYCLATE 100 MG PO TABS: 100.0000 mg | ORAL_TABLET | Freq: Two times a day (BID) | ORAL | 0 refills | Status: AC

## 2021-08-10 MED ORDER — HYDROCODONE-ACETAMINOPHEN 5-325 MG PO TABS
1.0000 | ORAL_TABLET | Freq: Three times a day (TID) | ORAL | 0 refills | Status: DC | PRN
Start: 2021-08-10 — End: 2021-08-22

## 2021-08-10 NOTE — Progress Notes (Signed)
Patient ID: Nicole Austin, female    DOB: [birth year], 61 y.o.   MRN: [id number]  PCP: Delsa Grana, PA-C  Chief Complaint  Patient presents with   Rash    Under Left armpit area pt states it burns, denies itch    Subjective:   Nicole Austin is a 61 y.o. female, presents to clinic with CC of the following:  HPI  Presents for rash to axilla/skin folds Visit 06/27/21 for the same: 1. Intertrigo  L30.4 nystatin (MYCOSTATIN/NYSTOP) powder      fluconazole (DIFLUCAN) 150 MG tablet      Ambulatory referral to [facility]    focus on keeping skin dry, airing it out, avoiding skin to skin rubbing and sweating, off label diflucan po x 2weeks with continued topical nystatin powder     2. Rash and nonspecific skin eruption  R21 nystatin (MYCOSTATIN/NYSTOP) powder      fluconazole (DIFLUCAN) 150 MG tablet      Ambulatory referral to [facility]    likely fungal with hx and recent abx use      Really has infections, recurrent abscesses and severe pain with some drainage to an area in left armpit  Patient Active Problem List   Diagnosis Date Noted   MRSA (methicillin resistant staph aureus) culture positive 05/04/2021   Breast abscess of female 05/04/2021   COPD with acute exacerbation ([place]) 01/01/2021   Hyperglycemia 01/01/2021   Prolonged QT interval 01/01/2021   Benign neoplasm of ascending colon    Polyp of sigmoid colon    Bipolar disorder with depression ([place]) 02/25/2017   ADD (attention deficit disorder) 02/25/2017   Marijuana use 02/13/2017   Neuropathy, peripheral 05/07/2016   Vitamin B12 deficiency 10/24/2015   Fibromyalgia 08/30/2015   Low HDL (under 40) 08/30/2015   GERD (gastroesophageal reflux disease) 08/30/2015   OP (osteoporosis) 06/30/2015   Sleep apnea 06/30/2015   Morbid (severe) obesity due to excess calories ([place]) 08/16/2014   Low back pain with sciatica 08/04/2014   Current tobacco use 08/04/2014   Polysubstance abuse ([place]) 07/04/2014    Psychoactive substance abuse ([place]) 07/04/2014   Anxiety, generalized 11/24/2013   COPD with asthma ([place]) 11/18/2013   Arthritis of knee, degenerative 07/15/2013   Osteoarthritis of knee, unspecified 07/15/2013      Current Outpatient Medications:    albuterol (VENTOLIN HFA) 108 (90 Base) MCG/ACT inhaler, Inhale 2 puffs into the lungs every 4 (four) hours as needed for wheezing or shortness of breath., Disp: 18 g, Rfl: 2   ALPRAZolam (XANAX) 0.5 MG tablet, Take 0.5 mg by mouth 4 (four) times daily., Disp: , Rfl:    atorvastatin (LIPITOR) 40 MG tablet, TAKE 1 TABLET BY MOUTH EVERY NIGHT AT BEDTIME, Disp: 90 tablet, Rfl: 3   divalproex (DEPAKOTE) 250 MG DR tablet, Take 1 tablet (250 mg total) by mouth 2 (two) times daily., Disp: 60 tablet, Rfl: 2   famotidine (PEPCID) 20 MG tablet, Take 1 tablet (20 mg total) by mouth 2 (two) times daily as needed (nausea/indigestion/stomach upset)., Disp: 60 tablet, Rfl: 1   ipratropium-albuterol (DUONEB) 0.5-2.5 (3) MG/3ML SOLN, Take 3 mLs by nebulization 3 (three) times daily as needed., Disp: 180 mL, Rfl: 1   lurasidone (LATUDA) 80 MG TABS tablet, Take 1 tablet (80 mg total) by mouth daily with breakfast., Disp: 90 tablet, Rfl: 0   meloxicam (MOBIC) 15 MG tablet, Take 15 mg by mouth daily., Disp: , Rfl:    mometasone-formoterol (DULERA) 100-5 MCG/ACT AERO, Inhale  2 puffs into the lungs in the morning and at bedtime., Disp: 1 each, Rfl: 3   Multiple Vitamin (MULTIVITAMIN WITH MINERALS) TABS tablet, Take 1 tablet by mouth daily., Disp: , Rfl:    nystatin (MYCOSTATIN/NYSTOP) powder, Apply 1 application. topically 3 (three) times daily as needed (rash in skin folds)., Disp: 60 g, Rfl: 1   nystatin ointment (MYCOSTATIN), Apply 1 application topically 2 (two) times daily., Disp: 30 g, Rfl: 0   promethazine (PHENERGAN) 25 MG tablet, Take 25 mg by mouth 2 (two) times daily as needed., Disp: , Rfl:    Tiotropium Bromide Monohydrate (SPIRIVA RESPIMAT) 2.5 MCG/ACT AERS,  INHHALE TWO PUFFS INTO LUNGS DAILY (Patient taking differently: Inhale 2 puffs into the lungs daily.), Disp: 4 g, Rfl: 2   tizanidine (ZANAFLEX) 2 MG capsule, Take 2-4 mg by mouth 3 (three) times daily as needed for muscle spasms., Disp: , Rfl:    tiZANidine (ZANAFLEX) 4 MG tablet, Take 1 tablet (4 mg total) by mouth 3 (three) times daily as needed for muscle spasms., Disp: 30 tablet, Rfl: 0   escitalopram (LEXAPRO) 20 MG tablet, Take 1 tablet (20 mg total) by mouth daily., Disp: 30 tablet, Rfl: 2   fluconazole (DIFLUCAN) 150 MG tablet, Take 2 tablets po (300 mg) daily x 1 d, then take 1 tab po once daily x 13 days (Patient not taking: Reported on 08/10/2021), Disp: 15 tablet, Rfl: 0   nicotine (NICODERM CQ - DOSED IN MG/24 HOURS) 21 mg/24hr patch, Place 1 patch (21 mg total) onto the skin daily., Disp: 45 patch, Rfl: 0   pantoprazole (PROTONIX) 40 MG tablet, Take 1 tablet (40 mg total) by mouth daily. (Patient not taking: Reported on 08/10/2021), Disp: 90 tablet, Rfl: 0   Allergies  Allergen Reactions   Chantix [Varenicline] Nausea Only     Social History   Tobacco Use   Smoking status: Every Day    Packs/day: 0.50    Years: 36.00    Total pack years: 18.00    Types: Cigarettes    Passive exposure: Past   Smokeless tobacco: Never   Tobacco comments:    Pt using nicotine patches; 1/2 PPD as of 03/21/21  Vaping Use   Vaping Use: Former  Substance Use Topics   Alcohol use: No    Alcohol/week: 0.0 standard drinks of alcohol   Drug use: No      Chart Review Today: I personally reviewed active problem list, medication list, allergies, family history, social history, health maintenance, notes from last encounter, lab results, imaging with the patient/caregiver today.   Review of Systems  Constitutional: Negative.   HENT: Negative.    Eyes: Negative.   Respiratory: Negative.    Cardiovascular: Negative.   Gastrointestinal: Negative.   Endocrine: Negative.   Genitourinary:  Negative.   Musculoskeletal: Negative.   Skin: Negative.   Allergic/Immunologic: Negative.   Neurological: Negative.   Hematological: Negative.   Psychiatric/Behavioral: Negative.    All other systems reviewed and are negative.      Objective:   Vitals:   08/10/21 1355  BP: 132/70  Pulse: 84  Resp: 16  Temp: 98 F (36.7 C)  TempSrc: Oral  SpO2: 95%  Weight: 236 lb 4.8 oz (107.2 kg)  Height: '5\' 1"'$  (1.549 m)    Body mass index is 44.65 kg/m.  Physical Exam Vitals and nursing note reviewed.  Constitutional:      General: She is not in acute distress.    Appearance: She is morbidly  obese. She is not ill-appearing, toxic-appearing or diaphoretic.     Comments: tearful  HENT:     Head: Normocephalic and atraumatic.  Cardiovascular:     Rate and Rhythm: Normal rate and regular rhythm.     Pulses: Normal pulses.     Heart sounds: Normal heart sounds.  Pulmonary:     Effort: Pulmonary effort is normal. No respiratory distress.     Breath sounds: Normal breath sounds. No wheezing.  Skin:    Findings: Abscess (left axilla roughly 3x2 cm with central opening with purulent discharge) present.  Neurological:     Mental Status: She is alert. Mental status is at baseline.  Psychiatric:        Mood and Affect: Mood normal.        Behavior: Behavior normal. Behavior is cooperative.      Results for orders placed or performed in visit on 04/20/21  Anaerobic and Aerobic Culture   Specimen: Soft Tissue Abscess   Abscess Abscess of lef  Result Value Ref Range   Anaerobic Culture Final report    Result 1 Comment    Aerobic Culture Final report (A)    Result 1 Comment (A)    Antimicrobial Susceptibility Comment        Assessment & Plan:   1. Abscess of left arm - Wound culture - Doxy coverage for ~5 d - pending culture - HYDROcodone-acetaminophen (NORCO) 5-325 MG tablet; Take 1 tablet by mouth 3 (three) times daily as needed for severe pain.  Dispense: 10 tablet;  Refill: 0 - mupirocin ointment (BACTROBAN) 2 %; Place 1 Application into the nose 2 (two) times daily for 7 days.  Dispense: 22 g; Refill: 0  2. History of MRSA infection  mupirocin ointment (BACTROBAN) 2 %; Place 1 Application into the nose 2 (two) times daily for 7 days.  Dispense: 22 g; Refill: 0   Pt with abscess to left axilla - small one on trunk and larger one to arm- arm with some scant purulent drainage and small area of scabbing - soaked in clinic with gauze and warm water, expressed gently  Size of abscess dramatically reduced from firm 4 cm diameter to roughly 2 cm Culture obtained with recent recurrent infections and hx of MRSA  Advised to try and showed with hibiclens or antimicrobial soap a few times a week We can try tx to nares with mupirocin Shorter abx since abscess is draining - advised warm soaks BID - TID to the area to encourage continued drainage Severe pain and sobbing while pt was in clinic Gave a few pain pills after draining her abscess today - side effects and risks were reviewed and she verbalized understanding - she was instructed NOT to take with any other sedating medications - and no refills  If she continues to have abscesses - may need derm?  If any worsening infection or abscess may need to f/up with gen surgery - Dr. Campbell Lerner, PA-C 08/10/21 2:06 PM

## 2021-08-10 NOTE — Patient Instructions (Signed)
Try to get hibiclens soap at your local pharmacy and use it a few times a week in the shower to decrease the amount of bacteria on your skin/body.  Skin Abscess  A skin abscess is an infected area on or under your skin that contains a collection of pus and other material. An abscess may also be called a furuncle, carbuncle, or boil. An abscess can occur in or on almost any part of your body. Some abscesses break open (rupture) on their own. Most continue to get worse unless they are treated. The infection can spread deeper into the body and eventually into your blood, which can make you feel ill. Treatment usually involves draining the abscess. What are the causes? An abscess occurs when germs, like bacteria, pass through your skin and cause an infection. This may be caused by: A scrape or cut on your skin. A puncture wound through your skin, including a needle injection or insect bite. Blocked oil or sweat glands. Blocked and infected hair follicles. A cyst that forms beneath your skin (sebaceous cyst) and becomes infected. What increases the risk? This condition is more likely to develop in people who: Have a weak body defense system (immune system). Have diabetes. Have dry and irritated skin. Get frequent injections or use illegal IV drugs. Have a foreign body in a wound, such as a splinter. Have problems with their lymph system or veins. What are the signs or symptoms? Symptoms of this condition include: A painful, firm bump under the skin. A bump with pus at the top. This may break through the skin and drain. Other symptoms include: Redness surrounding the abscess site. Warmth. Swelling of the lymph nodes (glands) near the abscess. Tenderness. A sore on the skin. How is this diagnosed? This condition may be diagnosed based on: A physical exam. Your medical history. A sample of pus. This may be used to find out what is causing the infection. Blood tests. Imaging tests, such  as an ultrasound, CT scan, or MRI. How is this treated? A small abscess that drains on its own may not need treatment. Treatment for larger abscesses may include: Moist heat or heat pack applied to the area several times a day. A procedure to drain the abscess (incision and drainage). Antibiotic medicines. For a severe abscess, you may first get antibiotics through an IV and then change to antibiotics by mouth. Follow these instructions at home: Medicines  Take over-the-counter and prescription medicines only as told by your health care provider. If you were prescribed an antibiotic medicine, take it as told by your health care provider. Do not stop taking the antibiotic even if you start to feel better. Abscess care  If you have an abscess that has not drained, apply heat to the affected area. Use the heat source that your health care provider recommends, such as a moist heat pack or a heating pad. Place a towel between your skin and the heat source. Leave the heat on for 20-30 minutes. Remove the heat if your skin turns bright red. This is especially important if you are unable to feel pain, heat, or cold. You may have a greater risk of getting burned. Follow instructions from your health care provider about how to take care of your abscess. Make sure you: Cover the abscess with a bandage (dressing). Change your dressing or gauze as told by your health care provider. Wash your hands with soap and water before you change the dressing or gauze. If soap and  water are not available, use hand sanitizer. Check your abscess every day for signs of a worsening infection. Check for: More redness, swelling, or pain. More fluid or blood. Warmth. More pus or a bad smell. General instructions To avoid spreading the infection: Do not share personal care items, towels, or hot tubs with others. Avoid making skin contact with other people. Keep all follow-up visits as told by your health care provider.  This is important. Contact a health care provider if you have: More redness, swelling, or pain around your abscess. More fluid or blood coming from your abscess. Warm skin around your abscess. More pus or a bad smell coming from your abscess. Muscle aches. Chills or a general ill feeling. Get help right away if you: Have severe pain. See red streaks on your skin spreading away from the abscess. See redness that spreads quickly. Have a fever or chills. Summary A skin abscess is an infected area on or under your skin that contains a collection of pus and other material. A small abscess that drains on its own may not need treatment. Treatment for larger abscesses may include having a procedure to drain the abscess and taking an antibiotic. This information is not intended to replace advice given to you by your health care provider. Make sure you discuss any questions you have with your health care provider. Document Revised: 11/21/2020 Document Reviewed: 11/21/2020 Elsevier Patient Education  [place].

## 2021-08-13 LAB — WOUND CULTURE
MICRO NUMBER:: 13533934
SPECIMEN QUALITY:: ADEQUATE

## 2021-08-14 ENCOUNTER — Telehealth: Payer: Self-pay

## 2021-08-14 NOTE — Telephone Encounter (Signed)
Pt called and asked of se needs to go to the hospital. Advised she needs to go to ED if her wound looks worse (spreading redness, swelling and pain). Pt stated she does not feel she needs to go to hospital at this time.

## 2021-08-15 ENCOUNTER — Encounter: Payer: Self-pay | Admitting: Emergency Medicine

## 2021-08-15 ENCOUNTER — Other Ambulatory Visit: Payer: Self-pay

## 2021-08-15 ENCOUNTER — Telehealth: Payer: Self-pay

## 2021-08-15 ENCOUNTER — Emergency Department
Admission: EM | Admit: 2021-08-15 | Discharge: 2021-08-15 | Disposition: A | Discharge status: Home / Self Care | Payer: 59 | Attending: Emergency Medicine | Admitting: Emergency Medicine

## 2021-08-15 DIAGNOSIS — A4902 Methicillin resistant Staphylococcus aureus infection, unspecified site: Secondary | ICD-10-CM | POA: Insufficient documentation

## 2021-08-15 DIAGNOSIS — Z22322 Carrier or suspected carrier of Methicillin resistant Staphylococcus aureus: Secondary | ICD-10-CM

## 2021-08-15 DIAGNOSIS — R7989 Other specified abnormal findings of blood chemistry: Secondary | ICD-10-CM | POA: Diagnosis present

## 2021-08-15 LAB — CBC WITH DIFFERENTIAL/PLATELET
Abs Immature Granulocytes: 0.01 10*3/uL (ref 0.00–0.07)
Basophils Absolute: 0.1 10*3/uL (ref 0.0–0.1)
Basophils Relative: 1 %
Eosinophils Absolute: 0.8 10*3/uL — ABNORMAL HIGH (ref 0.0–0.5)
Eosinophils Relative: 11 %
HCT: 38.8 % (ref 36.0–46.0)
Hemoglobin: 12.7 g/dL (ref 12.0–15.0)
Immature Granulocytes: 0 %
Lymphocytes Relative: 33 %
Lymphs Abs: 2.3 10*3/uL (ref 0.7–4.0)
MCH: 30 pg (ref 26.0–34.0)
MCHC: 32.7 g/dL (ref 30.0–36.0)
MCV: 91.7 fL (ref 80.0–100.0)
Monocytes Absolute: 0.7 10*3/uL (ref 0.1–1.0)
Monocytes Relative: 10 %
Neutro Abs: 3.1 10*3/uL (ref 1.7–7.7)
Neutrophils Relative %: 45 %
Platelets: 247 10*3/uL (ref 150–400)
RBC: 4.23 MIL/uL (ref 3.87–5.11)
RDW: 12.8 % (ref 11.5–15.5)
WBC: 6.9 10*3/uL (ref 4.0–10.5)
nRBC: 0 % (ref 0.0–0.2)

## 2021-08-15 LAB — BASIC METABOLIC PANEL
Anion gap: 8 (ref 5–15)
BUN: 9 mg/dL (ref 8–23)
CO2: 29 mmol/L (ref 22–32)
Calcium: 9.3 mg/dL (ref 8.9–10.3)
Chloride: 97 mmol/L — ABNORMAL LOW (ref 98–111)
Creatinine, Ser: 0.93 mg/dL (ref 0.44–1.00)
GFR, Estimated: >60 mL/min (ref >60)
Glucose, Bld: 93 mg/dL (ref 70–99)
Potassium: 4.4 mmol/L (ref 3.5–5.1)
Sodium: 134 mmol/L — ABNORMAL LOW (ref 135–145)

## 2021-08-15 MED ORDER — OXYCODONE-ACETAMINOPHEN 5-325 MG PO TABS
1.0000 | ORAL_TABLET | Freq: Once | ORAL | Status: AC
  Administered 2021-08-15: 1 via ORAL
  Filled 2021-08-15: qty 1

## 2021-08-15 MED ORDER — LINEZOLID 600 MG PO TABS: 600.0000 mg | ORAL_TABLET | Freq: Two times a day (BID) | ORAL | 0 refills | Status: DC

## 2021-08-15 MED ORDER — OXYCODONE-ACETAMINOPHEN 5-325 MG PO TABS: 1.0000 | ORAL_TABLET | ORAL | 0 refills | Status: DC | PRN

## 2021-08-15 MED ORDER — VANCOMYCIN HCL IN DEXTROSE 1-5 GM/200ML-% IV SOLN
1000.0000 mg | Freq: Once | INTRAVENOUS | Status: AC
Start: 2021-08-15 — End: 2021-08-15
  Administered 2021-08-15: 1000 mg via INTRAVENOUS
  Filled 2021-08-15: qty 200

## 2021-08-15 NOTE — Discharge Instructions (Signed)
You have been treated with an IV antibiotic in the emergency department.  Please follow-up with your primary care provider for ongoing management.  Please fill and begin taking your prescribed antibiotic.  Return to the emergency department for any fever or any other symptom personally concerning to yourself.

## 2021-08-15 NOTE — ED Triage Notes (Signed)
Pt here with possible MRSA. Pt states she had several cysts pop up. Pt states pain under her right arm and her chest. Pt also having nausea.

## 2021-08-15 NOTE — ED Triage Notes (Signed)
First Nurse Note:  Pt via EMS from home. Pt went to her PCP, pt has had some swollen lymph, pt was called today and was told she had MRSA. Denies complaints. Was told to come to ER. Pt is A&Ox4 and NAD  80 HR  150/90 100% on RA

## 2021-08-15 NOTE — ED Provider Triage Note (Signed)
Emergency Medicine Provider Triage Evaluation Note  Nicole Austin , a 61 y.o. female  was evaluated in triage.  Pt complains of MRSA, patient was seen by her PCP and started on doxycycline.  Had Bactroban for her nose.  Her doctor called her and told her she needed to come the ER because her strain of MRSA is resistant to the outpatient antibiotics and will need IV.  She has had no fever or chills..  Review of Systems  Positive:  Negative:   Physical Exam  BP (!) 145/59 (BP Location: Right Arm)   Pulse 75   Temp 98.2 F (36.8 C) (Oral)   Resp 18   LMP  (LMP Unknown)   SpO2 94%  Gen:   Awake, no distress   Resp:  Normal effort  MSK:   Moves extremities without difficulty  Other:    Medical Decision Making  Medically screening exam initiated at 4:37 PM.  Appropriate orders placed.  Nicole Austin was informed that the remainder of the evaluation will be completed by another provider, this initial triage assessment does not replace that evaluation, and the importance of remaining in the ED until their evaluation is complete.  We will do basic labs to assess if the patient actually would need to be admitted.  Feel that she probably could follow-up with infectious disease versus coming to the emergency department.   Nicole Starks, PA-C 08/15/21 [phone]

## 2021-08-15 NOTE — ED Notes (Signed)
Pt ambulatory to restroom with steady gait.

## 2021-08-15 NOTE — ED Provider Notes (Signed)
[facility] Provider Note    Event Date/Time   First MD Initiated Contact with Patient 08/15/21 1732     (approximate)  History   Chief Complaint: Abnormal lab  HPI  Nicole Austin is a 61 y.o. female with multiple medical issues presents to the emergency department for a wound culture that showed MRSA.  According to the patient she has a history of abscesses, states she recently saw her doctor who would lanced an abscess under her left armpit.  This was 5 days ago, wound culture was taken showing MRSA that was resistant to nearly all antibiotics and they referred her to the emergency department for IV antibiotics.  Patient states the pain is gone down in the left armpit, no fever.  Physical Exam   Triage Vital Signs: ED Triage Vitals  Enc Vitals Group     BP 08/15/21 1612 (!) 145/59     Pulse Rate 08/15/21 1612 75     Resp 08/15/21 1612 18     Temp 08/15/21 1612 98.2 F (36.8 C)     Temp Source 08/15/21 1612 Oral     SpO2 08/15/21 1612 94 %     Weight 08/15/21 1639 236 lb (107 kg)     Height 08/15/21 1639 '5\' 1"'$  (1.549 m)     Head Circumference --      Peak Flow --      Pain Score 08/15/21 1639 8     Pain Loc --      Pain Edu? --      Excl. in [place]? --     Most recent vital signs: Vitals:   08/15/21 1612 08/15/21 1640  BP: (!) 145/59 122/67  Pulse: 75 78  Resp: 18 18  Temp: 98.2 F (36.8 C) 98.3 F (36.8 C)  SpO2: 94% 92%    General: Awake, no distress.  CV:  Good peripheral perfusion.  Regular rate and rhythm  Resp:  Normal effort.  Equal breath sounds bilaterally.  Abd:  No distention.  Soft, nontender.  No rebound or guarding. Other:  Patient has mild tenderness to left armpit but no erythema, has an area where the lanced abscess/cyst was which appears well.  No drainable collection on my evaluation.   ED Results / Procedures / Treatments   MEDICATIONS ORDERED IN ED: Medications  vancomycin (VANCOCIN) IVPB 1000 mg/200 mL  premix (has no administration in time range)  oxyCODONE-acetaminophen (PERCOCET/ROXICET) 5-325 MG per tablet 1 tablet (has no administration in time range)     IMPRESSION / MDM / ASSESSMENT AND PLAN / ED COURSE  I reviewed the triage vital signs and the nursing notes.  Patient's presentation is most consistent with acute presentation with potential threat to life or bodily function.  Patient presents to the emergency department for a wound culture positive for MRSA that is resistant to nearly all oral antibiotics.  I reviewed the wound culture from 08/10/2021 showing nearly resistant MRSA however does susceptible to vancomycin and linezolid.  Patient's work-up in the emergency department is reassuring, CBC is normal, chemistry is normal.  We will check blood cultures as a precaution and start the patient on IV vancomycin.  We will discharge with oral linezolid and have the patient follow-up with her doctor.  Patient is agreeable to plan of care.  FINAL CLINICAL IMPRESSION(S) / ED DIAGNOSES   MRSA    Note:  This document was prepared using Dragon voice recognition software and may include unintentional dictation errors.  Harvest Dark, MD 08/15/21 1818

## 2021-08-15 NOTE — Progress Notes (Cosign Needed)
Chronic Care Management APPOINTMENT REMINDER   Called Ernesta Trabert Bolds, No answer, left message of appointment on 08/16/2021 at 3:45 pm via telephone visit with Junius Argyle , Pharm D. Notified to have all medications, supplements, blood pressure and/or blood sugar logs available during appointment and to return call if need to reschedule.  [place] Pharmacist Assistant [phone]

## 2021-08-16 ENCOUNTER — Telehealth: Payer: Self-pay

## 2021-08-16 ENCOUNTER — Telehealth: Payer: 59

## 2021-08-16 NOTE — Progress Notes (Deleted)
 Chronic Care Management Pharmacy Note  08/16/2021 Name:  Nicole Austin MRN:  [id number] DOB:  03/15/1960  Subjective: Nicole Austin is an 61 y.o. year old female who is a primary patient of Tapia, [name], PA-C.  The CCM team was consulted for assistance with disease management and care coordination needs.    Engaged with patient by telephone for initial visit in response to provider referral for pharmacy case management and/or care coordination services.   Consent to Services:  The patient was given the following information about Chronic Care Management services today, agreed to services, and gave verbal consent: 1. CCM service includes personalized support from designated clinical staff supervised by the primary care provider, including individualized plan of care and coordination with other care providers 2. 24/7 contact phone numbers for assistance for urgent and routine care needs. 3. Service will only be billed when office clinical staff spend 20 minutes or more in a month to coordinate care. 4. Only one practitioner may furnish and bill the service in a calendar month. 5.The patient may stop CCM services at any time (effective at the end of the month) by phone call to the office staff. 6. The patient will be responsible for cost sharing (co-pay) of up to 20% of the service fee (after annual deductible is met). Patient agreed to services and consent obtained.  Patient Care Team: Tapia, [name], PA-C as PCP - General (Family Medicine) Su, Hansen, MD as Referring Physician (Psychiatry) Hambright, Jennifer L, LCSW as Social Worker (Professional Counselor) ,  A, RPH (Pharmacist)  Recent office visits: 04/01/20: Video visit with [name] Tapia for vaginitis. Patient started on fluconazole and nystatin.  02/02/20: Patient presented to Dr. Hendrickson for COPD exacerbation. Patient started on doxycycline and prednisone.   Recent consult visits: None in previous 6 months    Hospital visits: None in previous 6 months   Subjective:   Patient reports she has been sleeping a lot more recently, feeling very low energy. She has been stressed because she will have to take care of sister following her sister undergoing a TKR.    Objective:  Lab Results  Component Value Date   CREATININE 0.93 08/15/2021   BUN 9 08/15/2021   GFRNONAA >60 08/15/2021   GFRAA 76 12/07/2019   NA 134 (L) 08/15/2021   K 4.4 08/15/2021   CALCIUM 9.3 08/15/2021   CO2 29 08/15/2021    Lab Results  Component Value Date/Time   HGBA1C 5.8 (H) 01/03/2021 03:36 AM   HGBA1C 4.0 08/18/2020 12:00 AM   HGBA1C 5.6 05/14/2019 08:42 PM   HGBA1C 5.7 10/21/2013 04:06 AM   MICROALBUR CANCELED 10/04/2015 04:07 PM   MICROALBUR <0.2 10/04/2015 04:07 PM    Last diabetic Eye exam: No results found for: "HMDIABEYEEXA"  Last diabetic Foot exam: No results found for: "HMDIABFOOTEX"   Lab Results  Component Value Date   CHOL 117 12/07/2019   HDL 31 (L) 12/07/2019   LDLCALC 58 12/07/2019   TRIG 216 (H) 12/07/2019   CHOLHDL 3.8 12/07/2019       Latest Ref Rng & Units 01/03/2021    3:36 AM 12/07/2019    3:16 PM 08/06/2019    3:34 PM  Hepatic Function  Total Protein 6.5 - 8.1 g/dL 6.1  6.6  6.7   Albumin 3.5 - 5.0 g/dL 2.7     AST 15 - 41 U/L 27  12  18   ALT 0 - 44 U/L 24  9  11      Chronic Care Management Pharmacy Note  08/16/2021 Name:  Nicole Austin MRN:  [id number] DOB:  03/15/1960  Subjective: Nicole Austin is an 61 y.o. year old female who is a primary patient of Tapia, [name], PA-C.  The CCM team was consulted for assistance with disease management and care coordination needs.    Engaged with patient by telephone for initial visit in response to provider referral for pharmacy case management and/or care coordination services.   Consent to Services:  The patient was given the following information about Chronic Care Management services today, agreed to services, and gave verbal consent: 1. CCM service includes personalized support from designated clinical staff supervised by the primary care provider, including individualized plan of care and coordination with other care providers 2. 24/7 contact phone numbers for assistance for urgent and routine care needs. 3. Service will only be billed when office clinical staff spend 20 minutes or more in a month to coordinate care. 4. Only one practitioner may furnish and bill the service in a calendar month. 5.The patient may stop CCM services at any time (effective at the end of the month) by phone call to the office staff. 6. The patient will be responsible for cost sharing (co-pay) of up to 20% of the service fee (after annual deductible is met). Patient agreed to services and consent obtained.  Patient Care Team: Tapia, [name], PA-C as PCP - General (Family Medicine) Su, Hansen, MD as Referring Physician (Psychiatry) Hambright, Jennifer L, LCSW as Social Worker (Professional Counselor) ,  A, RPH (Pharmacist)  Recent office visits: 04/01/20: Video visit with [name] Tapia for vaginitis. Patient started on fluconazole and nystatin.  02/02/20: Patient presented to Dr. Hendrickson for COPD exacerbation. Patient started on doxycycline and prednisone.   Recent consult visits: None in previous 6 months    Hospital visits: None in previous 6 months   Subjective:   Patient reports she has been sleeping a lot more recently, feeling very low energy. She has been stressed because she will have to take care of sister following her sister undergoing a TKR.    Objective:  Lab Results  Component Value Date   CREATININE 0.93 08/15/2021   BUN 9 08/15/2021   GFRNONAA >60 08/15/2021   GFRAA 76 12/07/2019   NA 134 (L) 08/15/2021   K 4.4 08/15/2021   CALCIUM 9.3 08/15/2021   CO2 29 08/15/2021    Lab Results  Component Value Date/Time   HGBA1C 5.8 (H) 01/03/2021 03:36 AM   HGBA1C 4.0 08/18/2020 12:00 AM   HGBA1C 5.6 05/14/2019 08:42 PM   HGBA1C 5.7 10/21/2013 04:06 AM   MICROALBUR CANCELED 10/04/2015 04:07 PM   MICROALBUR <0.2 10/04/2015 04:07 PM    Last diabetic Eye exam: No results found for: "HMDIABEYEEXA"  Last diabetic Foot exam: No results found for: "HMDIABFOOTEX"   Lab Results  Component Value Date   CHOL 117 12/07/2019   HDL 31 (L) 12/07/2019   LDLCALC 58 12/07/2019   TRIG 216 (H) 12/07/2019   CHOLHDL 3.8 12/07/2019       Latest Ref Rng & Units 01/03/2021    3:36 AM 12/07/2019    3:16 PM 08/06/2019    3:34 PM  Hepatic Function  Total Protein 6.5 - 8.1 g/dL 6.1  6.6  6.7   Albumin 3.5 - 5.0 g/dL 2.7     AST 15 - 41 U/L 27  12  18   ALT 0 - 44 U/L 24  9  11      Chronic Care Management Pharmacy Note  08/16/2021 Name:  Nicole Austin MRN:  [id number] DOB:  03/15/1960  Subjective: Nicole Austin is an 61 y.o. year old female who is a primary patient of Tapia, [name], PA-C.  The CCM team was consulted for assistance with disease management and care coordination needs.    Engaged with patient by telephone for initial visit in response to provider referral for pharmacy case management and/or care coordination services.   Consent to Services:  The patient was given the following information about Chronic Care Management services today, agreed to services, and gave verbal consent: 1. CCM service includes personalized support from designated clinical staff supervised by the primary care provider, including individualized plan of care and coordination with other care providers 2. 24/7 contact phone numbers for assistance for urgent and routine care needs. 3. Service will only be billed when office clinical staff spend 20 minutes or more in a month to coordinate care. 4. Only one practitioner may furnish and bill the service in a calendar month. 5.The patient may stop CCM services at any time (effective at the end of the month) by phone call to the office staff. 6. The patient will be responsible for cost sharing (co-pay) of up to 20% of the service fee (after annual deductible is met). Patient agreed to services and consent obtained.  Patient Care Team: Tapia, [name], PA-C as PCP - General (Family Medicine) Su, Hansen, MD as Referring Physician (Psychiatry) Hambright, Jennifer L, LCSW as Social Worker (Professional Counselor) ,  A, RPH (Pharmacist)  Recent office visits: 04/01/20: Video visit with [name] Tapia for vaginitis. Patient started on fluconazole and nystatin.  02/02/20: Patient presented to Dr. Hendrickson for COPD exacerbation. Patient started on doxycycline and prednisone.   Recent consult visits: None in previous 6 months    Hospital visits: None in previous 6 months   Subjective:   Patient reports she has been sleeping a lot more recently, feeling very low energy. She has been stressed because she will have to take care of sister following her sister undergoing a TKR.    Objective:  Lab Results  Component Value Date   CREATININE 0.93 08/15/2021   BUN 9 08/15/2021   GFRNONAA >60 08/15/2021   GFRAA 76 12/07/2019   NA 134 (L) 08/15/2021   K 4.4 08/15/2021   CALCIUM 9.3 08/15/2021   CO2 29 08/15/2021    Lab Results  Component Value Date/Time   HGBA1C 5.8 (H) 01/03/2021 03:36 AM   HGBA1C 4.0 08/18/2020 12:00 AM   HGBA1C 5.6 05/14/2019 08:42 PM   HGBA1C 5.7 10/21/2013 04:06 AM   MICROALBUR CANCELED 10/04/2015 04:07 PM   MICROALBUR <0.2 10/04/2015 04:07 PM    Last diabetic Eye exam: No results found for: "HMDIABEYEEXA"  Last diabetic Foot exam: No results found for: "HMDIABFOOTEX"   Lab Results  Component Value Date   CHOL 117 12/07/2019   HDL 31 (L) 12/07/2019   LDLCALC 58 12/07/2019   TRIG 216 (H) 12/07/2019   CHOLHDL 3.8 12/07/2019       Latest Ref Rng & Units 01/03/2021    3:36 AM 12/07/2019    3:16 PM 08/06/2019    3:34 PM  Hepatic Function  Total Protein 6.5 - 8.1 g/dL 6.1  6.6  6.7   Albumin 3.5 - 5.0 g/dL 2.7     AST 15 - 41 U/L 27  12  18   ALT 0 - 44 U/L 24  9  11      Chronic Care Management Pharmacy Note  08/16/2021 Name:  Nicole Austin MRN:  [id number] DOB:  03/15/1960  Subjective: Nicole Austin is an 61 y.o. year old female who is a primary patient of Tapia, [name], PA-C.  The CCM team was consulted for assistance with disease management and care coordination needs.    Engaged with patient by telephone for initial visit in response to provider referral for pharmacy case management and/or care coordination services.   Consent to Services:  The patient was given the following information about Chronic Care Management services today, agreed to services, and gave verbal consent: 1. CCM service includes personalized support from designated clinical staff supervised by the primary care provider, including individualized plan of care and coordination with other care providers 2. 24/7 contact phone numbers for assistance for urgent and routine care needs. 3. Service will only be billed when office clinical staff spend 20 minutes or more in a month to coordinate care. 4. Only one practitioner may furnish and bill the service in a calendar month. 5.The patient may stop CCM services at any time (effective at the end of the month) by phone call to the office staff. 6. The patient will be responsible for cost sharing (co-pay) of up to 20% of the service fee (after annual deductible is met). Patient agreed to services and consent obtained.  Patient Care Team: Tapia, [name], PA-C as PCP - General (Family Medicine) Su, Hansen, MD as Referring Physician (Psychiatry) Hambright, Jennifer L, LCSW as Social Worker (Professional Counselor) ,  A, RPH (Pharmacist)  Recent office visits: 04/01/20: Video visit with [name] Tapia for vaginitis. Patient started on fluconazole and nystatin.  02/02/20: Patient presented to Dr. Hendrickson for COPD exacerbation. Patient started on doxycycline and prednisone.   Recent consult visits: None in previous 6 months    Hospital visits: None in previous 6 months   Subjective:   Patient reports she has been sleeping a lot more recently, feeling very low energy. She has been stressed because she will have to take care of sister following her sister undergoing a TKR.    Objective:  Lab Results  Component Value Date   CREATININE 0.93 08/15/2021   BUN 9 08/15/2021   GFRNONAA >60 08/15/2021   GFRAA 76 12/07/2019   NA 134 (L) 08/15/2021   K 4.4 08/15/2021   CALCIUM 9.3 08/15/2021   CO2 29 08/15/2021    Lab Results  Component Value Date/Time   HGBA1C 5.8 (H) 01/03/2021 03:36 AM   HGBA1C 4.0 08/18/2020 12:00 AM   HGBA1C 5.6 05/14/2019 08:42 PM   HGBA1C 5.7 10/21/2013 04:06 AM   MICROALBUR CANCELED 10/04/2015 04:07 PM   MICROALBUR <0.2 10/04/2015 04:07 PM    Last diabetic Eye exam: No results found for: "HMDIABEYEEXA"  Last diabetic Foot exam: No results found for: "HMDIABFOOTEX"   Lab Results  Component Value Date   CHOL 117 12/07/2019   HDL 31 (L) 12/07/2019   LDLCALC 58 12/07/2019   TRIG 216 (H) 12/07/2019   CHOLHDL 3.8 12/07/2019       Latest Ref Rng & Units 01/03/2021    3:36 AM 12/07/2019    3:16 PM 08/06/2019    3:34 PM  Hepatic Function  Total Protein 6.5 - 8.1 g/dL 6.1  6.6  6.7   Albumin 3.5 - 5.0 g/dL 2.7     AST 15 - 41 U/L 27  12  18   ALT 0 - 44 U/L 24  9  11      Chronic Care Management Pharmacy Note  08/16/2021 Name:  Nicole Austin MRN:  [id number] DOB:  03/15/1960  Subjective: Nicole Austin is an 61 y.o. year old female who is a primary patient of Tapia, [name], PA-C.  The CCM team was consulted for assistance with disease management and care coordination needs.    Engaged with patient by telephone for initial visit in response to provider referral for pharmacy case management and/or care coordination services.   Consent to Services:  The patient was given the following information about Chronic Care Management services today, agreed to services, and gave verbal consent: 1. CCM service includes personalized support from designated clinical staff supervised by the primary care provider, including individualized plan of care and coordination with other care providers 2. 24/7 contact phone numbers for assistance for urgent and routine care needs. 3. Service will only be billed when office clinical staff spend 20 minutes or more in a month to coordinate care. 4. Only one practitioner may furnish and bill the service in a calendar month. 5.The patient may stop CCM services at any time (effective at the end of the month) by phone call to the office staff. 6. The patient will be responsible for cost sharing (co-pay) of up to 20% of the service fee (after annual deductible is met). Patient agreed to services and consent obtained.  Patient Care Team: Tapia, [name], PA-C as PCP - General (Family Medicine) Su, Hansen, MD as Referring Physician (Psychiatry) Hambright, Jennifer L, LCSW as Social Worker (Professional Counselor) ,  A, RPH (Pharmacist)  Recent office visits: 04/01/20: Video visit with [name] Tapia for vaginitis. Patient started on fluconazole and nystatin.  02/02/20: Patient presented to Dr. Hendrickson for COPD exacerbation. Patient started on doxycycline and prednisone.   Recent consult visits: None in previous 6 months    Hospital visits: None in previous 6 months   Subjective:   Patient reports she has been sleeping a lot more recently, feeling very low energy. She has been stressed because she will have to take care of sister following her sister undergoing a TKR.    Objective:  Lab Results  Component Value Date   CREATININE 0.93 08/15/2021   BUN 9 08/15/2021   GFRNONAA >60 08/15/2021   GFRAA 76 12/07/2019   NA 134 (L) 08/15/2021   K 4.4 08/15/2021   CALCIUM 9.3 08/15/2021   CO2 29 08/15/2021    Lab Results  Component Value Date/Time   HGBA1C 5.8 (H) 01/03/2021 03:36 AM   HGBA1C 4.0 08/18/2020 12:00 AM   HGBA1C 5.6 05/14/2019 08:42 PM   HGBA1C 5.7 10/21/2013 04:06 AM   MICROALBUR CANCELED 10/04/2015 04:07 PM   MICROALBUR <0.2 10/04/2015 04:07 PM    Last diabetic Eye exam: No results found for: "HMDIABEYEEXA"  Last diabetic Foot exam: No results found for: "HMDIABFOOTEX"   Lab Results  Component Value Date   CHOL 117 12/07/2019   HDL 31 (L) 12/07/2019   LDLCALC 58 12/07/2019   TRIG 216 (H) 12/07/2019   CHOLHDL 3.8 12/07/2019       Latest Ref Rng & Units 01/03/2021    3:36 AM 12/07/2019    3:16 PM 08/06/2019    3:34 PM  Hepatic Function  Total Protein 6.5 - 8.1 g/dL 6.1  6.6  6.7   Albumin 3.5 - 5.0 g/dL 2.7     AST 15 - 41 U/L 27  12  18   ALT 0 - 44 U/L 24  9  11   

## 2021-08-16 NOTE — Telephone Encounter (Signed)
  Care Management   Follow Up Note   08/16/2021 Name: Nicole Austin MRN: [id number] DOB: 05/24/1960   Referred by: Delsa Grana, PA-C Reason for referral : No chief complaint on file.   Third unsuccessful telephone outreach was attempted today. The patient was referred to the case management team for assistance with care management and care coordination. The patient's primary care provider has been notified of our unsuccessful attempts to make or maintain contact with the patient. The care management team is pleased to engage with this patient at any time in the future should he/she be interested in assistance from the care management team.   Follow Up Plan:  Dis-enrolled from CCM. Please refer if patient interested in re-engaging with clinical pharmacy team.  Malva Limes, [place] Pharmacist Practitioner  [facility] [phone]

## 2021-08-17 ENCOUNTER — Telehealth: Payer: Self-pay | Admitting: Family Medicine

## 2021-08-17 NOTE — Telephone Encounter (Unsigned)
Copied from [place] [id number]. Topic: General - Inquiry >> Aug 17, 2021  2:58 PM Erskine Squibb wrote: Reason for CRM: Patient called in requesting to speak with Cristie Hem and returning a call. I tried to transfer her over but it went straight into voice mail so she just wanted me to leave a message. Please assist patient further.

## 2021-08-18 ENCOUNTER — Ambulatory Visit: Payer: Self-pay

## 2021-08-18 NOTE — Telephone Encounter (Signed)
Pt would like to know what she need to do about her swollen feet. Please return call as we are booked for today

## 2021-08-20 LAB — CULTURE, BLOOD (ROUTINE X 2)
Culture: NO GROWTH
Culture: NO GROWTH
Special Requests: ADEQUATE

## 2021-08-22 ENCOUNTER — Other Ambulatory Visit: Payer: Self-pay

## 2021-08-22 ENCOUNTER — Encounter: Payer: Self-pay | Admitting: Intensive Care

## 2021-08-22 ENCOUNTER — Emergency Department
Admission: EM | Admit: 2021-08-22 | Discharge: 2021-08-22 | Disposition: A | Discharge status: Home / Self Care | Payer: 59 | Attending: Emergency Medicine | Admitting: Emergency Medicine

## 2021-08-22 ENCOUNTER — Ambulatory Visit: Payer: Self-pay

## 2021-08-22 ENCOUNTER — Emergency Department: Payer: 59

## 2021-08-22 ENCOUNTER — Telehealth (INDEPENDENT_AMBULATORY_CARE_PROVIDER_SITE_OTHER): Payer: 59 | Admitting: Family Medicine

## 2021-08-22 ENCOUNTER — Encounter: Payer: Self-pay | Admitting: Family Medicine

## 2021-08-22 VITALS — Ht 61.0 in | Wt 236.0 lb

## 2021-08-22 DIAGNOSIS — J45909 Unspecified asthma, uncomplicated: Secondary | ICD-10-CM | POA: Insufficient documentation

## 2021-08-22 DIAGNOSIS — R112 Nausea with vomiting, unspecified: Secondary | ICD-10-CM | POA: Diagnosis present

## 2021-08-22 DIAGNOSIS — G20 Parkinson's disease: Secondary | ICD-10-CM | POA: Diagnosis not present

## 2021-08-22 DIAGNOSIS — F17200 Nicotine dependence, unspecified, uncomplicated: Secondary | ICD-10-CM | POA: Diagnosis not present

## 2021-08-22 DIAGNOSIS — R197 Diarrhea, unspecified: Secondary | ICD-10-CM | POA: Diagnosis not present

## 2021-08-22 DIAGNOSIS — A4902 Methicillin resistant Staphylococcus aureus infection, unspecified site: Secondary | ICD-10-CM | POA: Diagnosis not present

## 2021-08-22 DIAGNOSIS — R531 Weakness: Secondary | ICD-10-CM | POA: Diagnosis not present

## 2021-08-22 DIAGNOSIS — J449 Chronic obstructive pulmonary disease, unspecified: Secondary | ICD-10-CM | POA: Diagnosis not present

## 2021-08-22 LAB — BASIC METABOLIC PANEL
Anion gap: 8 (ref 5–15)
BUN: 12 mg/dL (ref 8–23)
CO2: 26 mmol/L (ref 22–32)
Calcium: 8.4 mg/dL — ABNORMAL LOW (ref 8.9–10.3)
Chloride: 96 mmol/L — ABNORMAL LOW (ref 98–111)
Creatinine, Ser: 1.26 mg/dL — ABNORMAL HIGH (ref 0.44–1.00)
GFR, Estimated: 48 mL/min — ABNORMAL LOW (ref >60)
Glucose, Bld: 90 mg/dL (ref 70–99)
Potassium: 4.2 mmol/L (ref 3.5–5.1)
Sodium: 130 mmol/L — ABNORMAL LOW (ref 135–145)

## 2021-08-22 LAB — CBC
HCT: 35.9 % — ABNORMAL LOW (ref 36.0–46.0)
Hemoglobin: 11.7 g/dL — ABNORMAL LOW (ref 12.0–15.0)
MCH: 29.5 pg (ref 26.0–34.0)
MCHC: 32.6 g/dL (ref 30.0–36.0)
MCV: 90.7 fL (ref 80.0–100.0)
Platelets: 243 10*3/uL (ref 150–400)
RBC: 3.96 MIL/uL (ref 3.87–5.11)
RDW: 12.7 % (ref 11.5–15.5)
WBC: 6.5 10*3/uL (ref 4.0–10.5)
nRBC: 0 % (ref 0.0–0.2)

## 2021-08-22 LAB — TROPONIN I (HIGH SENSITIVITY): Troponin I (High Sensitivity): 3 ng/L (ref <18)

## 2021-08-22 LAB — LIPASE, BLOOD: Lipase: 28 U/L (ref 11–51)

## 2021-08-22 MED ORDER — ONDANSETRON HCL 4 MG/2ML IJ SOLN
4.0000 mg | Freq: Once | INTRAMUSCULAR | Status: DC
  Filled 2021-08-22: qty 2

## 2021-08-22 MED ORDER — SODIUM CHLORIDE 0.9 % IV BOLUS: 1000.0000 mL | Freq: Once | INTRAVENOUS | Status: DC

## 2021-08-22 MED ORDER — ACETAMINOPHEN 500 MG PO TABS
1000.0000 mg | ORAL_TABLET | Freq: Once | ORAL | Status: AC
Start: 2021-08-22 — End: 2021-08-22
  Administered 2021-08-22: 1000 mg via ORAL
  Filled 2021-08-22: qty 2

## 2021-08-22 MED ORDER — IPRATROPIUM-ALBUTEROL 0.5-2.5 (3) MG/3ML IN SOLN
3.0000 mL | Freq: Once | RESPIRATORY_TRACT | Status: AC
  Administered 2021-08-22: 3 mL via RESPIRATORY_TRACT
  Filled 2021-08-22: qty 3

## 2021-08-22 MED ORDER — PREDNISONE 20 MG PO TABS
60.0000 mg | ORAL_TABLET | Freq: Once | ORAL | Status: AC
  Administered 2021-08-22: 60 mg via ORAL
  Filled 2021-08-22: qty 3

## 2021-08-22 NOTE — Telephone Encounter (Signed)
Patient returning call back to [facility]. Please call back

## 2021-08-22 NOTE — ED Notes (Signed)
RN went to room and pt was not in room. Called pt and she had eloped and left the ER. Dr. Sidney Ace notified.

## 2021-08-22 NOTE — ED Triage Notes (Signed)
Patient c/o nausea, diarrhea, and fatigue. Patient currently on antibiotics for MRSA diagnosis recently. Patient also reports while sitting in triage having right sided chest pain that feels like a cramp.

## 2021-08-23 ENCOUNTER — Ambulatory Visit: Payer: Self-pay | Admitting: Family Medicine

## 2021-08-23 NOTE — Telephone Encounter (Signed)
  Chief Complaint: Called in with multiple complaints:  Has MRSA, bad headache, cold, can't get warm, balance is off, diarrhea and vomiting every time she eats, no energy.  Symptoms: See above.   Had headache since 6/21 Frequency: constant.   Tylenol not helping Pertinent Negatives: Patient denies staying in ED yesterday.   I left.   It was too long and I felt so bad. Disposition: '[]'$ ED /'[]'$ Urgent Care (no appt availability in office) / '[x]'$ Appointment(In office/virtual)/ '[]'$  [facility] [facility]/ '[]'$ Home Care/ '[]'$ Refused Recommended Disposition /'[]'$ [place] [facility]/ '[]'$  Follow-up with PCP Additional Notes: She needs at least a 3 day notice to arrange transportation through her insurance co.  Made an appt with Delsa Grana, PA-C for 09/01/2021 at 3:00.  Went over s/s to go to ED for.   Pt was agreeable to this plan.

## 2021-08-23 NOTE — Telephone Encounter (Signed)
Reason for Disposition  [1] MODERATE headache (e.g., interferes with normal activities) AND [2] present > 24 hours AND [3] unexplained  (Exceptions: analgesics not tried, typical migraine, or headache part of viral illness)  Answer Assessment - Initial Assessment Questions 1. LOCATION: "Where does it hurt?"      I have a headache since 21st.   I finally went to hospital.    [name] said I had MRSA.   She gave me antibiotics because I hurt all over.   I'm so cold and I don't have any energy.   I can't get warm.   I went to ED again yesterday.   They were poking me and blood was going everywhere.   I was freezing and did not feel good.   I left.   I felt so bad.   They did blood work and it looked good.   I waited in room for IV Team.  They never got an IV in me.    2. ONSET: "When did the headache start?" (Minutes, hours or days)      Since June 21st.    3. PATTERN: "Does the pain come and go, or has it been constant since it started?"     Constant.   Yesterday was last day of my antibiotic to take.  I have nausea and diarrhea.   I have to have diarrhea every time I eat. 4. SEVERITY: "How bad is the pain?" and "What does it keep you from doing?"  (e.g., Scale 1-10; mild, moderate, or severe)   - MILD (1-3): doesn't interfere with normal activities    - MODERATE (4-7): interferes with normal activities or awakens from sleep    - SEVERE (8-10): excruciating pain, unable to do any normal activities        Terrible 5. RECURRENT SYMPTOM: "Have you ever had headaches before?" If Yes, ask: "When was the last time?" and "What happened that time?"      New for me 6. CAUSE: "What do you think is causing the headache?"     MRSA I think 7. MIGRAINE: "Have you been diagnosed with migraine headaches?" If Yes, ask: "Is this headache similar?"      No 8. HEAD INJURY: "Has there been any recent injury to the head?"      No 9. OTHER SYMPTOMS: "Do you have any other symptoms?" (fever, stiff neck, eye pain, sore  throat, cold symptoms)     My balance is off.   I have to catch myself with the wall because my balance is off.   10. PREGNANCY: "Is there any chance you are pregnant?" "When was your last menstrual period?"       N/A  Protocols used: Headache-A-AH

## 2021-08-24 NOTE — Telephone Encounter (Signed)
Spoke to pt and she already has an appt on 09/01/21 with [name]. Pt states due to transportation taking 3 business days to get scheduled and the 4th of July holiday taking her out to [place] she is just going to wait for her appt on 09/01/21

## 2021-09-01 ENCOUNTER — Encounter: Payer: Self-pay | Admitting: Family Medicine

## 2021-09-01 ENCOUNTER — Ambulatory Visit (INDEPENDENT_AMBULATORY_CARE_PROVIDER_SITE_OTHER): Payer: Medicare Other | Admitting: Family Medicine

## 2021-09-01 VITALS — BP 134/72 | HR 87 | Temp 98.0°F | Resp 16 | Ht 61.0 in | Wt 235.4 lb

## 2021-09-01 DIAGNOSIS — J441 Chronic obstructive pulmonary disease with (acute) exacerbation: Secondary | ICD-10-CM | POA: Diagnosis not present

## 2021-09-01 DIAGNOSIS — R0609 Other forms of dyspnea: Secondary | ICD-10-CM

## 2021-09-01 DIAGNOSIS — Z09 Encounter for follow-up examination after completed treatment for conditions other than malignant neoplasm: Secondary | ICD-10-CM

## 2021-09-01 DIAGNOSIS — Z720 Tobacco use: Secondary | ICD-10-CM

## 2021-09-01 DIAGNOSIS — N179 Acute kidney failure, unspecified: Secondary | ICD-10-CM | POA: Diagnosis not present

## 2021-09-01 DIAGNOSIS — L02412 Cutaneous abscess of left axilla: Secondary | ICD-10-CM

## 2021-09-01 DIAGNOSIS — Z5181 Encounter for therapeutic drug level monitoring: Secondary | ICD-10-CM

## 2021-09-01 DIAGNOSIS — J449 Chronic obstructive pulmonary disease, unspecified: Secondary | ICD-10-CM

## 2021-09-01 MED ORDER — IPRATROPIUM-ALBUTEROL 0.5-2.5 (3) MG/3ML IN SOLN: 3.0000 mL | Freq: Three times a day (TID) | RESPIRATORY_TRACT | 1 refills | Status: DC | PRN

## 2021-09-01 MED ORDER — PREDNISONE 20 MG PO TABS: 40.0000 mg | ORAL_TABLET | Freq: Every day | ORAL | 0 refills | Status: AC

## 2021-09-01 NOTE — Progress Notes (Signed)
Patient ID: Nicole Austin, female    DOB: 08/27/1960, 61 y.o.   MRN: [id number]  PCP: Delsa Grana, PA-C  Chief Complaint  Patient presents with   Headache   Nausea   Diarrhea    All sx started when she get off the antibiotics that were prescribed to her on her previous visit.   Abscess    More abscess continue to grow on right arm near the armpit area.    Subjective:   Nicole Austin is a 61 y.o. female, presents to clinic with CC of the following:  HPI   Worse SOB and cough than her normal  Smokes 1 ppd on spiriva and dulera and having more SOB DOE and cough and using inhalers   Abscess left armpit improved and small abscess has almost come to a head but is not draining spontaneously - very painful   Discussed importance of smoking cessation as well as different options available.  Encouraged smoking cessation.  Gauged progress on stages of change.  Provided with [email or web address].[facility] resource.    Anxiety, Bipolar d/o - Medications: lexapro, latuda, depakote, xanax prn - followed by Psychiatry, Neurology. Last appt with Psych was last week. Seeing therapy every Friday. Last saw neurology 01/2021 for tremor, referred for second opinion 01/2021.     Patient Active Problem List   Diagnosis Date Noted   MRSA (methicillin resistant staph aureus) culture positive 05/04/2021   Hyperglycemia 01/01/2021   Prolonged QT interval 01/01/2021   Benign neoplasm of ascending colon    Polyp of sigmoid colon    Bipolar disorder with depression ([place]) 02/25/2017   ADD (attention deficit disorder) 02/25/2017   Marijuana use 02/13/2017   Neuropathy, peripheral 05/07/2016   Vitamin B12 deficiency 10/24/2015   Fibromyalgia 08/30/2015   Low HDL (under 40) 08/30/2015   GERD (gastroesophageal reflux disease) 08/30/2015   OP (osteoporosis) 06/30/2015   Sleep apnea 06/30/2015   Morbid (severe) obesity due to excess calories ([place]) 08/16/2014   Low back pain with sciatica 08/04/2014    Current tobacco use 08/04/2014   Polysubstance abuse ([place]) 07/04/2014   Psychoactive substance abuse ([place]) 07/04/2014   Anxiety, generalized 11/24/2013   COPD with asthma ([place]) 11/18/2013   Arthritis of knee, degenerative 07/15/2013   Osteoarthritis of knee, unspecified 07/15/2013      Current Outpatient Medications:    albuterol (VENTOLIN HFA) 108 (90 Base) MCG/ACT inhaler, Inhale 2 puffs into the lungs every 4 (four) hours as needed for wheezing or shortness of breath., Disp: 18 g, Rfl: 2   ALPRAZolam (XANAX) 0.5 MG tablet, Take 0.5 mg by mouth 4 (four) times daily., Disp: , Rfl:    atorvastatin (LIPITOR) 40 MG tablet, TAKE 1 TABLET BY MOUTH EVERY NIGHT AT BEDTIME, Disp: 90 tablet, Rfl: 3   divalproex (DEPAKOTE) 250 MG DR tablet, Take 1 tablet (250 mg total) by mouth 2 (two) times daily., Disp: 60 tablet, Rfl: 2   famotidine (PEPCID) 20 MG tablet, Take 1 tablet (20 mg total) by mouth 2 (two) times daily as needed (nausea/indigestion/stomach upset)., Disp: 60 tablet, Rfl: 1   ipratropium-albuterol (DUONEB) 0.5-2.5 (3) MG/3ML SOLN, Take 3 mLs by nebulization 3 (three) times daily as needed., Disp: 180 mL, Rfl: 1   linezolid (ZYVOX) 600 MG tablet, Take 1 tablet (600 mg total) by mouth 2 (two) times daily., Disp: 20 tablet, Rfl: 0   lurasidone (LATUDA) 80 MG TABS tablet, Take 1 tablet (80 mg total) by mouth daily with breakfast., Disp: 90 tablet,  Rfl: 0   meloxicam (MOBIC) 15 MG tablet, Take 15 mg by mouth daily., Disp: , Rfl:    mometasone-formoterol (DULERA) 100-5 MCG/ACT AERO, Inhale 2 puffs into the lungs in the morning and at bedtime., Disp: 1 each, Rfl: 3   Multiple Vitamin (MULTIVITAMIN WITH MINERALS) TABS tablet, Take 1 tablet by mouth daily., Disp: , Rfl:    nystatin (MYCOSTATIN/NYSTOP) powder, Apply 1 application. topically 3 (three) times daily as needed (rash in skin folds)., Disp: 60 g, Rfl: 1   nystatin ointment (MYCOSTATIN), Apply 1 application topically 2 (two) times daily.,  Disp: 30 g, Rfl: 0   oxyCODONE-acetaminophen (PERCOCET) 5-325 MG tablet, Take 1 tablet by mouth every 4 (four) hours as needed for severe pain., Disp: 10 tablet, Rfl: 0   promethazine (PHENERGAN) 25 MG tablet, Take 25 mg by mouth 2 (two) times daily as needed., Disp: , Rfl:    Tiotropium Bromide Monohydrate (SPIRIVA RESPIMAT) 2.5 MCG/ACT AERS, INHHALE TWO PUFFS INTO LUNGS DAILY (Patient taking differently: Inhale 2 puffs into the lungs daily.), Disp: 4 g, Rfl: 2   tiZANidine (ZANAFLEX) 4 MG tablet, Take 1 tablet (4 mg total) by mouth 3 (three) times daily as needed for muscle spasms., Disp: 30 tablet, Rfl: 0   escitalopram (LEXAPRO) 20 MG tablet, Take 1 tablet (20 mg total) by mouth daily., Disp: 30 tablet, Rfl: 2   nicotine (NICODERM CQ - DOSED IN MG/24 HOURS) 21 mg/24hr patch, Place 1 patch (21 mg total) onto the skin daily., Disp: 45 patch, Rfl: 0   Allergies  Allergen Reactions   Chantix [Varenicline] Nausea Only     Social History   Tobacco Use   Smoking status: Every Day    Packs/day: 0.50    Years: 36.00    Total pack years: 18.00    Types: Cigarettes    Passive exposure: Past   Smokeless tobacco: Never   Tobacco comments:    Pt using nicotine patches; 1/2 PPD as of 03/21/21  Vaping Use   Vaping Use: Former  Substance Use Topics   Alcohol use: No    Alcohol/week: 0.0 standard drinks of alcohol   Drug use: No      Chart Review Today: I personally reviewed active problem list, medication list, allergies, family history, social history, health maintenance, notes from last encounter, lab results, imaging with the patient/caregiver today.   Review of Systems  Constitutional: Negative.   HENT: Negative.    Eyes: Negative.   Respiratory: Negative.    Cardiovascular: Negative.   Gastrointestinal: Negative.   Endocrine: Negative.   Genitourinary: Negative.   Musculoskeletal: Negative.   Skin: Negative.   Allergic/Immunologic: Negative.   Neurological: Negative.    Hematological: Negative.   Psychiatric/Behavioral: Negative.    All other systems reviewed and are negative.      Objective:   Vitals:   09/01/21 1459  BP: 134/72  Pulse: 87  Resp: 16  Temp: 98 F (36.7 C)  TempSrc: Oral  SpO2: 97%  Weight: 235 lb 6.4 oz (106.8 kg)  Height: '5\' 1"'$  (1.549 m)    Body mass index is 44.48 kg/m.  Physical Exam Vitals and nursing note reviewed.  Constitutional:      General: She is not in acute distress.    Appearance: She is morbidly obese. She is not ill-appearing, toxic-appearing or diaphoretic.     Comments: tearful  HENT:     Head: Normocephalic and atraumatic.     Right Ear: External ear normal.  Left Ear: External ear normal.     Nose: Nose normal.     Mouth/Throat:     Mouth: Mucous membranes are moist.  Eyes:     General:        Right eye: No discharge.        Left eye: No discharge.     Conjunctiva/sclera: Conjunctivae normal.  Cardiovascular:     Rate and Rhythm: Normal rate and regular rhythm.     Pulses: Normal pulses.     Heart sounds: Normal heart sounds.  Pulmonary:     Effort: Pulmonary effort is normal. No respiratory distress.     Breath sounds: Wheezing present.  Skin:    Findings: Abscess (left axilla roughly 3x2 cm with central opening with purulent discharge) present.     Comments: Left lower anterior axilla 1 cm erythematous papule, raised, ttp, with minimal surrounding erythema and edema  Neurological:     Mental Status: She is alert. Mental status is at baseline.  Psychiatric:        Mood and Affect: Mood normal.        Behavior: Behavior normal. Behavior is cooperative.      Results for orders placed or performed during the hospital encounter of 28/36/62  Basic metabolic panel  Result Value Ref Range   Sodium 130 (L) 135 - 145 mmol/L   Potassium 4.2 3.5 - 5.1 mmol/L   Chloride 96 (L) 98 - 111 mmol/L   CO2 26 22 - 32 mmol/L   Glucose, Bld 90 70 - 99 mg/dL   BUN 12 8 - 23 mg/dL   Creatinine,  Ser 1.26 (H) 0.44 - 1.00 mg/dL   Calcium 8.4 (L) 8.9 - 10.3 mg/dL   GFR, Estimated 48 (L) >60 mL/min   Anion gap 8 5 - 15  CBC  Result Value Ref Range   WBC 6.5 4.0 - 10.5 K/uL   RBC 3.96 3.87 - 5.11 MIL/uL   Hemoglobin 11.7 (L) 12.0 - 15.0 g/dL   HCT 35.9 (L) 36.0 - 46.0 %   MCV 90.7 80.0 - 100.0 fL   MCH 29.5 26.0 - 34.0 pg   MCHC 32.6 30.0 - 36.0 g/dL   RDW 12.7 11.5 - 15.5 %   Platelets 243 150 - 400 K/uL   nRBC 0.0 0.0 - 0.2 %  Lipase, blood  Result Value Ref Range   Lipase 28 11 - 51 U/L  Troponin I (High Sensitivity)  Result Value Ref Range   Troponin I (High Sensitivity) 3 <18 ng/L    I & D  Date/Time: 09/08/2021 7:13 PM  Performed by: Delsa Grana, PA-C Authorized by: Delsa Grana, PA-C   Consent:    Consent obtained:  Verbal   Risks, benefits, and alternatives were discussed: yes     Risks discussed:  Bleeding, incomplete drainage, pain and infection   Alternatives discussed:  No treatment and delayed treatment Location:    Type:  Abscess   Size:  1 cm diameter   Location:  Upper extremity   Upper extremity location: left axilla. Pre-procedure details:    Skin preparation:  Alcohol Sedation:    Sedation type:  None Anesthesia:    Anesthesia method:  Topical application Procedure details:    Needle aspiration: yes     Needle size:  18 G   Incision types:  Stab incision and single straight   Incision depth:  Dermal   Wound management:  Irrigated with saline and debrided   Drainage:  Purulent  Drainage amount:  Moderate   Wound treatment:  Wound left open Post-procedure details:    Procedure completion:  Tolerated well, no immediate complications       Assessment & Plan:     ICD-10-CM   1. COPD with acute exacerbation ([place])  J44.1 Ambulatory referral to Pulmonology    predniSONE (DELTASONE) 20 MG tablet    ipratropium-albuterol (DUONEB) 0.5-2.5 (3) MG/3ML SOLN   given neb in clinic, wheeze improved, tx with nebs and steroids, with GI upset and  recent abx avoid abx, needs pulm eval/pfts    2. DOE (dyspnea on exertion)  R06.09 Ambulatory referral to Pulmonology    3. Current tobacco use  Z72.0 Ambulatory referral to Pulmonology    4. AKI (acute kidney injury) ([place])  U13.2 COMPLETE METABOLIC PANEL WITH GFR    5. Encounter for medication monitoring  G40.10 COMPLETE METABOLIC PANEL WITH GFR    6. COPD with asthma ([place])  J44.9    uncontrolled, noncompliant with maintenence inhaler, refill on prior inhaler, refer to pulm for further eval    7. COPD with acute exacerbation ([place])  J44.1 Ambulatory referral to Pulmonology    predniSONE (DELTASONE) 20 MG tablet    ipratropium-albuterol (DUONEB) 0.5-2.5 (3) MG/3ML SOLN   steroids, resume maintenence inhaler, spiriva refilled due to pt preference, she doesn't like trelegy, f/up pulm, likely needs some pfts    8. Abscess of left axilla  L02.412 I & D    9. Encounter for examination following treatment at hospital  U72 COMPLETE METABOLIC PANEL WITH GFR   labs reviewed, renal function will need recheck          Delsa Grana, PA-C 09/01/21 3:41 PM

## 2021-09-02 LAB — COMPLETE METABOLIC PANEL WITH GFR
AG Ratio: 1.4 (calc) (ref 1.0–2.5)
ALT: 17 U/L (ref 6–29)
AST: 24 U/L (ref 10–35)
Albumin: 3.9 g/dL (ref 3.6–5.1)
Alkaline phosphatase (APISO): 78 U/L (ref 37–153)
BUN/Creatinine Ratio: 10 (calc) (ref 6–22)
BUN: 12 mg/dL (ref 7–25)
CO2: 29 mmol/L (ref 20–32)
Calcium: 9.9 mg/dL (ref 8.6–10.4)
Chloride: 99 mmol/L (ref 98–110)
Creat: 1.23 mg/dL — ABNORMAL HIGH (ref 0.50–1.05)
Globulin: 2.8 g/dL (calc) (ref 1.9–3.7)
Glucose, Bld: 89 mg/dL (ref 65–99)
Potassium: 4.7 mmol/L (ref 3.5–5.3)
Sodium: 134 mmol/L — ABNORMAL LOW (ref 135–146)
Total Bilirubin: 0.3 mg/dL (ref 0.2–1.2)
Total Protein: 6.7 g/dL (ref 6.1–8.1)
eGFR: 50 mL/min/{1.73_m2} — ABNORMAL LOW (ref >=60)

## 2021-09-04 NOTE — Progress Notes (Deleted)
Pt called back but line disconnected. Attempted to reach, left VM to call back.

## 2021-09-08 DIAGNOSIS — L02412 Cutaneous abscess of left axilla: Secondary | ICD-10-CM

## 2021-09-20 ENCOUNTER — Other Ambulatory Visit: Payer: Self-pay | Admitting: Family Medicine

## 2021-09-20 DIAGNOSIS — K219 Gastro-esophageal reflux disease without esophagitis: Secondary | ICD-10-CM

## 2021-09-20 DIAGNOSIS — R110 Nausea: Secondary | ICD-10-CM

## 2021-10-04 ENCOUNTER — Ambulatory Visit: Payer: Medicare Other | Admitting: Family Medicine

## 2021-10-18 ENCOUNTER — Ambulatory Visit: Payer: Self-pay

## 2021-10-18 NOTE — Telephone Encounter (Signed)
This encounter was created in error - please disregard.

## 2021-10-18 NOTE — Telephone Encounter (Signed)
Pt has an appointment tomorrow.

## 2021-10-18 NOTE — Telephone Encounter (Signed)
  Chief Complaint: Boils under left arm Symptoms: Several small boils Frequency: 1 week - but has a hx of boils Pertinent Negatives: Patient denies Fever Disposition: '[]'$ ED /'[]'$ Urgent Care (no appt availability in office) / '[x]'$ Appointment(In office/virtual)/ '[]'$  [facility]/ '[]'$ Home Care/ '[]'$ Refused Recommended Disposition /'[]'$ [facility]/ '[]'$  Follow-up with PCP Additional Notes: Pt states that she has a hx of boils. Boils were noticed Sunday under left arm.  Boils are currently about the size of an eraser on a pencil.   Reason for Disposition  Boil > 1/2 inch across (> 12 mm; larger than a marble)  2 or more boils  Answer Assessment - Initial Assessment Questions 1. APPEARANCE of BOIL: "What does the boil look like?"      Pus filled 2. LOCATION: "Where is the boil located?"      Left arm pit 3. NUMBER: "How many boils are there?"      several 4. SIZE: "How big is the boil?" (e.g., inches, cm; compare to size of a coin or other object)     Pencil eraser 5. ONSET: "When did the boil start?"     This week 6. PAIN: "Is there any pain?" If Yes, ask: "How bad is the pain?"   (Scale 1-10; or mild, moderate, severe)     8/10 7. FEVER: "Do you have a fever?" If Yes, ask: "What is it, how was it measured, and when did it start?"      no 8. SOURCE: "Have you been around anyone with boils or other Staph infections?" "Have you ever had boils before?"     no 9. OTHER SYMPTOMS: "Do you have any other symptoms?" (e.g., shaking chills, weakness, rash elsewhere on body)     no 10. PREGNANCY: "Is there any chance you are pregnant?" "When was your last menstrual period?"       no  Protocols used: Boil (Skin Abscess)-A-AH

## 2021-10-19 ENCOUNTER — Telehealth: Payer: Self-pay | Admitting: Family Medicine

## 2021-10-19 ENCOUNTER — Encounter: Payer: Self-pay | Admitting: Family Medicine

## 2021-10-19 ENCOUNTER — Telehealth (INDEPENDENT_AMBULATORY_CARE_PROVIDER_SITE_OTHER): Payer: Medicare Other | Admitting: Family Medicine

## 2021-10-19 ENCOUNTER — Telehealth: Payer: Self-pay

## 2021-10-19 DIAGNOSIS — L0291 Cutaneous abscess, unspecified: Secondary | ICD-10-CM

## 2021-10-19 DIAGNOSIS — Z22322 Carrier or suspected carrier of Methicillin resistant Staphylococcus aureus: Secondary | ICD-10-CM | POA: Diagnosis not present

## 2021-10-19 NOTE — Telephone Encounter (Signed)
Called pt no answer left vm to call back. I was calling to inform her PCP [name] wanted me to let her know that-  she cannot prescribe antibiotics for her to her pharmacy because only IV antibiotics will work - so she should do warm soaks and go to the ER if worsening cause they will have to give her IV abx.  I consulted dermatology and infectious disease

## 2021-10-19 NOTE — Telephone Encounter (Signed)
Copied from [place] [id number]. Topic: General - Other >> Oct 19, 2021  4:28 PM [name] wrote: Reason for CRM: The patient has been directed by their pharmacy to contact their PCP about a previously discussed prescription for antibiotics   Please contact the patient further when possible  The patient would like to know when they can pick up their prescription

## 2021-10-19 NOTE — Telephone Encounter (Signed)
Attempted to reach pt, left VM to call back. Another encounter open on issue with message from PCP.

## 2021-10-19 NOTE — Telephone Encounter (Signed)
Multiple attempts to reach pt, left VM each time. Left detailed message from [place], [place] to leave detailed message per DPR.  . I was calling to inform her PCP [name] wanted me to let her know that-  she cannot prescribe antibiotics for her to her pharmacy because only IV antibiotics will work - so she should do warm soaks and go to the ER if worsening cause they will have to give her IV abx.  I consulted dermatology and infectious disease

## 2021-10-19 NOTE — Telephone Encounter (Signed)
Attempted to reach pt, left VM to call back. 

## 2021-10-19 NOTE — Progress Notes (Signed)
Name: Nicole Austin   MRN: [id number]    DOB: [birth year]   Date:10/19/2021       Progress Note  Subjective:    Chief Complaint  Chief Complaint  Patient presents with   Abscess    Around armpit on both arms, recurrent onset for months. Pt unable to give vital signs    I connected with  [name] on 10/19/21 at  2:20 PM EDT by telephone and verified that I am speaking with the correct person using two identifiers.   I discussed the limitations, risks, security and privacy concerns of performing an evaluation and management service by telephone and the availability of in person appointments. Staff also discussed with the patient that there may be a patient responsible charge related to this service.  Patient verbalized understanding and agreed to proceed with encounter. Patient Location: at a friends house Provider Location: [facility] clinic Additional Individuals present:   Abscess   Recurrent boils abscesses left axilla and one popping up to right arm Painful, not draining She had several earlier this year appt and ER visits Feb, March, April, June and July + for MRSA in Feb and June  Last culture and infection required linezolid  Previously tx with doxy, bactrim and, keflex (multiple courses in 2023)  Last culture showed diffuse abx resistance: 2 mo ago   MICRO NUMBER: [id number]   SPECIMEN QUALITY: Adequate   SOURCE: WOUND (SITE NOT SPECIFIED)   STATUS: FINAL   GRAM STAIN: Many White blood cells seen No epithelial cells seen Rare Gram positive cocci in clusters   ISOLATE 1: methicillin resistant Staphylococcus aureus Abnormal    Comment: Moderate growth of Methicillin resistant Staphylococcus aureus (MRSA)  Resulting Agency [facility]     Susceptibility   Methicillin resistant staphylococcus aureus    AEROBIC CULT, GRAM STAIN POSITIVE 1    CIPROFLOXACIN >=8  Resistant    CLINDAMYCIN >=8  Resistant    ERYTHROMYCIN >=8  Resistant     GENTAMICIN <=0.5  Sensitive    LEVOFLOXACIN 4  Intermediate    OXACILLIN NR  Resistant 1    TETRACYCLINE >=16  Resistant    TRIMETH/SULFA >=320  Resistant 2    VANCOMYCIN 1  Sensitive            Patient Active Problem List   Diagnosis Date Noted   MRSA (methicillin resistant staph aureus) culture positive 05/04/2021   Hyperglycemia 01/01/2021   Prolonged QT interval 01/01/2021   Benign neoplasm of ascending colon    Polyp of sigmoid colon    Bipolar disorder with depression ([place]) 02/25/2017   ADD (attention deficit disorder) 02/25/2017   Marijuana use 02/13/2017   Neuropathy, peripheral 05/07/2016   Vitamin B12 deficiency 10/24/2015   Fibromyalgia 08/30/2015   Low HDL (under 40) 08/30/2015   GERD (gastroesophageal reflux disease) 08/30/2015   OP (osteoporosis) 06/30/2015   Sleep apnea 06/30/2015   Morbid (severe) obesity due to excess calories ([place]) 08/16/2014   Low back pain with sciatica 08/04/2014   Current tobacco use 08/04/2014   Polysubstance abuse ([place]) 07/04/2014   Psychoactive substance abuse ([place]) 07/04/2014   Anxiety, generalized 11/24/2013   COPD with asthma ([place]) 11/18/2013   Arthritis of knee, degenerative 07/15/2013   Osteoarthritis of knee, unspecified 07/15/2013    Social History   Tobacco Use   Smoking status: Every Day    Packs/day: 0.50    Years: 36.00    Total pack years: 18.00    Types: Cigarettes  Passive exposure: Past   Smokeless tobacco: Never   Tobacco comments:    Pt using nicotine patches; 1/2 PPD as of 03/21/21  Substance Use Topics   Alcohol use: No    Alcohol/week: 0.0 standard drinks of alcohol     Current Outpatient Medications:    albuterol (VENTOLIN HFA) 108 (90 Base) MCG/ACT inhaler, Inhale 2 puffs into the lungs every 4 (four) hours as needed for wheezing or shortness of breath., Disp: 18 g, Rfl: 2   ALPRAZolam (XANAX) 0.5 MG tablet, Take 0.5 mg by mouth 4 (four) times daily., Disp: , Rfl:    atorvastatin (LIPITOR) 40  MG tablet, TAKE 1 TABLET BY MOUTH EVERY NIGHT AT BEDTIME, Disp: 90 tablet, Rfl: 3   divalproex (DEPAKOTE) 250 MG DR tablet, Take 1 tablet (250 mg total) by mouth 2 (two) times daily., Disp: 60 tablet, Rfl: 2   famotidine (PEPCID) 20 MG tablet, TAKE 1 TABLET BY MOUTH 2 TIMES DAILY AS NEEDED FOR NAUSEA/INDIGESTION/STOMACH UPSET., Disp: 60 tablet, Rfl: 1   ipratropium-albuterol (DUONEB) 0.5-2.5 (3) MG/3ML SOLN, Take 3 mLs by nebulization 3 (three) times daily as needed., Disp: 180 mL, Rfl: 1   linezolid (ZYVOX) 600 MG tablet, Take 1 tablet (600 mg total) by mouth 2 (two) times daily., Disp: 20 tablet, Rfl: 0   Lurasidone HCl 60 MG TABS, Take 1 tablet by mouth daily., Disp: , Rfl:    meloxicam (MOBIC) 15 MG tablet, Take 15 mg by mouth daily., Disp: , Rfl:    mometasone-formoterol (DULERA) 100-5 MCG/ACT AERO, Inhale 2 puffs into the lungs in the morning and at bedtime., Disp: 1 each, Rfl: 3   Multiple Vitamin (MULTIVITAMIN WITH MINERALS) TABS tablet, Take 1 tablet by mouth daily., Disp: , Rfl:    nystatin (MYCOSTATIN/NYSTOP) powder, Apply 1 application. topically 3 (three) times daily as needed (rash in skin folds)., Disp: 60 g, Rfl: 1   nystatin ointment (MYCOSTATIN), Apply 1 application topically 2 (two) times daily., Disp: 30 g, Rfl: 0   oxyCODONE-acetaminophen (PERCOCET) 5-325 MG tablet, Take 1 tablet by mouth every 4 (four) hours as needed for severe pain., Disp: 10 tablet, Rfl: 0   promethazine (PHENERGAN) 25 MG tablet, Take 25 mg by mouth 2 (two) times daily as needed., Disp: , Rfl:    Tiotropium Bromide Monohydrate (SPIRIVA RESPIMAT) 2.5 MCG/ACT AERS, INHHALE TWO PUFFS INTO LUNGS DAILY (Patient taking differently: Inhale 2 puffs into the lungs daily.), Disp: 4 g, Rfl: 2   tiZANidine (ZANAFLEX) 4 MG tablet, Take 1 tablet (4 mg total) by mouth 3 (three) times daily as needed for muscle spasms., Disp: 30 tablet, Rfl: 0   escitalopram (LEXAPRO) 20 MG tablet, Take 1 tablet (20 mg total) by mouth daily.,  Disp: 30 tablet, Rfl: 2   lurasidone (LATUDA) 80 MG TABS tablet, Take 1 tablet (80 mg total) by mouth daily with breakfast. (Patient not taking: Reported on 10/19/2021), Disp: 90 tablet, Rfl: 0   nicotine (NICODERM CQ - DOSED IN MG/24 HOURS) 21 mg/24hr patch, Place 1 patch (21 mg total) onto the skin daily., Disp: 45 patch, Rfl: 0  Allergies  Allergen Reactions   Chantix [Varenicline] Nausea Only    Chart Review: I personally reviewed active problem list, medication list, allergies, family history, social history, health maintenance, notes from last encounter, lab results, imaging with the patient/caregiver today.   Review of Systems  Constitutional: Negative.   HENT: Negative.    Eyes: Negative.   Respiratory: Negative.    Cardiovascular: Negative.  Gastrointestinal: Negative.   Endocrine: Negative.   Genitourinary: Negative.   Musculoskeletal: Negative.   Skin: Negative.   Allergic/Immunologic: Negative.   Neurological: Negative.   Hematological: Negative.   Psychiatric/Behavioral: Negative.    All other systems reviewed and are negative.    Objective:    Virtual encounter, vitals limited, only able to obtain the following There were no vitals filed for this visit. There is no height or weight on file to calculate BMI. Nursing Note and Vital Signs reviewed.  Physical Exam Vitals and nursing note reviewed.  Neck:     Trachea: Phonation normal.  Pulmonary:     Effort: No respiratory distress.  Psychiatric:        Mood and Affect: Mood normal.        Behavior: Behavior normal.     PE limited by telephone encounter   No results found for this or any previous visit (from the past 72 hour(s)).  Assessment and Plan:     ICD-10-CM   1. Abscess  L02.91 Ambulatory referral to Dermatology    Ambulatory referral to Infectious Disease    2. MRSA (methicillin resistant staph aureus) culture positive  Z22.322 Ambulatory referral to Dermatology    Ambulatory referral  to Infectious Disease     Do not know if more antibiotics will even be helpful at this point - encouraged warm compresses, f/up with surgery if she needs I&D, she reports a minor prior hx of few abscesses to her groin, and only these to axilla this year - consider HS?  Will consult derm.   With MRSA ID may also be helpful.     -Red flags and when to present for emergency care or RTC including but not limited to new/worsening/un-resolving symptoms,  reviewed with patient at time of visit. Follow up and care instructions discussed and provided in AVS. - I discussed the assessment and treatment plan with the patient. The patient was provided an opportunity to ask questions and all were answered. The patient agreed with the plan and demonstrated an understanding of the instructions.  - The patient was advised to call back or seek an in-person evaluation if the symptoms worsen or if the condition fails to improve as anticipated.  I provided 25+ minutes of non-face-to-face time during this encounter.  Delsa Grana, PA-C 10/19/21 2:53 PM

## 2021-10-20 NOTE — Telephone Encounter (Signed)
I tried again to speak to her unable to reach her. Straight to vm.

## 2021-10-24 ENCOUNTER — Other Ambulatory Visit: Payer: Self-pay

## 2021-10-24 ENCOUNTER — Emergency Department
Admission: EM | Admit: 2021-10-24 | Discharge: 2021-10-24 | Disposition: A | Discharge status: Home / Self Care | Payer: Medicare Other | Attending: Emergency Medicine | Admitting: Emergency Medicine

## 2021-10-24 DIAGNOSIS — L0291 Cutaneous abscess, unspecified: Secondary | ICD-10-CM

## 2021-10-24 DIAGNOSIS — L02411 Cutaneous abscess of right axilla: Secondary | ICD-10-CM | POA: Diagnosis present

## 2021-10-24 MED ORDER — DOXYCYCLINE HYCLATE 100 MG PO TABS: 100.0000 mg | ORAL_TABLET | Freq: Once | ORAL | Status: DC

## 2021-10-24 MED ORDER — OXYCODONE-ACETAMINOPHEN 5-325 MG PO TABS: 1.0000 | ORAL_TABLET | Freq: Once | ORAL | Status: DC

## 2021-10-24 MED ORDER — DOXYCYCLINE HYCLATE 100 MG PO TABS: 100.0000 mg | ORAL_TABLET | Freq: Two times a day (BID) | ORAL | 0 refills | Status: DC

## 2021-10-24 MED ORDER — OXYCODONE-ACETAMINOPHEN 5-325 MG PO TABS: 1.0000 | ORAL_TABLET | ORAL | 0 refills | Status: DC | PRN

## 2021-10-24 NOTE — Telephone Encounter (Signed)
Form has not been received yet will call patient once we receive it and fax it back.

## 2021-10-24 NOTE — Discharge Instructions (Signed)
Follow-up with your regular doctor if not improving 3 days.  Return emergency department worsening.  Take medications as prescribed.  Apply warm compress to the area.  Keep a bandage on the area to catch the drainage.

## 2021-10-24 NOTE — Telephone Encounter (Signed)
Copied from [place] [id number]. Topic: General - Other >> Oct 24, 2021 11:55 AM Leitha Schuller wrote: Reason for CRM: Pt making provider aware that medical transportation is faxing over a form verfying she is a current pt  Pt requesting a cb once form is ready for pick up

## 2021-10-24 NOTE — ED Triage Notes (Signed)
Pt comes via [facility] with c/o abscess under right arm. Pt states she noticed it last week. Pt states drainage.

## 2021-10-24 NOTE — ED Provider Notes (Signed)
[facility] Provider Note    Event Date/Time   First MD Initiated Contact with Patient 10/24/21 1016     (approximate)   History   Abscess   HPI  Nicole Austin is a 61 y.o. female with history of frequent abscesses along with multiple past medical history problems, and MRSA positive presents to the emergency department complaining of an abscess in the right axilla.  Patient denies any fever or chills.  States area became very sore and started draining.  States hurts to move her arm.  States she has some that are starting to appear in the left axilla.  Her regular doctor was supposed to send in an antibiotic but the pharmacy did not call her.  She states she is also having pain.  She arrived via EMS.      Physical Exam   Triage Vital Signs: ED Triage Vitals  Enc Vitals Group     BP 10/24/21 0938 (!) 157/68     Pulse Rate 10/24/21 0938 64     Resp 10/24/21 0938 18     Temp 10/24/21 0938 97.9 F (36.6 C)     Temp src --      SpO2 10/24/21 0938 99 %     Weight --      Height --      Head Circumference --      Peak Flow --      Pain Score 10/24/21 0936 10     Pain Loc --      Pain Edu? --      Excl. in [place]? --     Most recent vital signs: Vitals:   10/24/21 0938  BP: (!) 157/68  Pulse: 64  Resp: 18  Temp: 97.9 F (36.6 C)  SpO2: 99%     General: Awake, no distress.   CV:  Good peripheral perfusion. regular rate and  rhythm Resp:  Normal effort. Abd:  No distention.   Other:  Right axilla with a quarter sized abscess that is actively draining, mild redness noted or surrounding the area but no induration   ED Results / Procedures / Treatments   Labs (all labs ordered are listed, but only abnormal results are displayed) Labs Reviewed  AEROBIC CULTURE W GRAM STAIN (SUPERFICIAL SPECIMEN)     EKG     RADIOLOGY     PROCEDURES:   Procedures   MEDICATIONS ORDERED IN ED: Medications  doxycycline (VIBRA-TABS)  tablet 100 mg (has no administration in time range)  oxyCODONE-acetaminophen (PERCOCET/ROXICET) 5-325 MG per tablet 1 tablet (has no administration in time range)     IMPRESSION / MDM / ASSESSMENT AND PLAN / ED COURSE  I reviewed the triage vital signs and the nursing notes.                              Differential diagnosis includes, but is not limited to, abscess, MRSA, cellulitis  Patient's presentation is most consistent with acute complicated illness / injury requiring diagnostic workup.   Since the area is already draining I did not perform an I&D.  Did place her on doxycycline.  Sent a aerobic wound culture.  She is to follow-up with her regular doctor, surgery if the area worsens and dermatology for recurrent abscesses.  She may need to see infectious disease but will await the culture to assess for MRSA.  He was given a Percocet while here in the ED.  She was given 8 Percocet as a prescription.  Discharged in stable condition.      FINAL CLINICAL IMPRESSION(S) / ED DIAGNOSES   Final diagnoses:  Abscess     Rx / DC Orders   ED Discharge Orders          Ordered    doxycycline (VIBRA-TABS) 100 MG tablet  2 times daily        10/24/21 1049    oxyCODONE-acetaminophen (PERCOCET) 5-325 MG tablet  Every 4 hours PRN        10/24/21 1049             Note:  This document was prepared using Dragon voice recognition software and may include unintentional dictation errors.    Versie Starks, PA-C 10/24/21 1056    Harvest Dark, MD 10/24/21 1430

## 2021-10-26 LAB — AEROBIC CULTURE W GRAM STAIN (SUPERFICIAL SPECIMEN): Gram Stain: NONE SEEN

## 2021-10-27 ENCOUNTER — Telehealth: Payer: Self-pay | Admitting: Emergency Medicine

## 2021-10-27 ENCOUNTER — Telehealth: Payer: Self-pay | Admitting: Family Medicine

## 2021-10-27 NOTE — Telephone Encounter (Signed)
No answer from pt left vm to return call. I do see there is a telephone encounter done by Vallery Ridge from ER about changing a medication.

## 2021-10-27 NOTE — Telephone Encounter (Signed)
Copied from [place] [id number]. Topic: General - Other >> Oct 27, 2021  3:31 PM Leitha Schuller wrote: Pt states she received a call from the hospital advising her they up the MG for her antibiotics  Pt inquiring why, inquired if pt asked caller, pt stated she forgot  Please assist pt further

## 2021-10-27 NOTE — Telephone Encounter (Signed)
Called patient and informed her of need to stop the doxycycline and start clindamycin.  She is aware that I willl call to [place].  Per susan fisher pa--clindamycin 150 mg --take 2 pills three times a day for 7 days.    Patient also asked about more pain meds, but I told her that the ED is very limited on how much we can prescribe.  I advised her to call pcp and inform them of ED visit and maybe they can see her sooner than her appt date.  She agrees.

## 2021-10-27 NOTE — Telephone Encounter (Signed)
Pt was advised By provider at the hospital to contact PCP for refill for a few more oxyCODONE-acetaminophen (PERCOCET) 5-325 MG tablet / please advise and send to [facility]

## 2021-10-27 NOTE — Telephone Encounter (Signed)
Pt needs an appointment, has been transferred to [facility] to schedule.

## 2021-10-31 ENCOUNTER — Ambulatory Visit: Payer: Medicare Other | Admitting: Infectious Diseases

## 2021-11-02 ENCOUNTER — Ambulatory Visit: Payer: Medicare Other | Admitting: Infectious Diseases

## 2021-11-03 ENCOUNTER — Ambulatory Visit: Payer: Medicare Other | Admitting: Nurse Practitioner

## 2021-11-07 ENCOUNTER — Other Ambulatory Visit: Payer: Self-pay | Admitting: Family Medicine

## 2021-11-07 DIAGNOSIS — J449 Chronic obstructive pulmonary disease, unspecified: Secondary | ICD-10-CM

## 2021-11-07 DIAGNOSIS — J441 Chronic obstructive pulmonary disease with (acute) exacerbation: Secondary | ICD-10-CM

## 2021-11-07 DIAGNOSIS — J4489 Other specified chronic obstructive pulmonary disease: Secondary | ICD-10-CM

## 2021-11-07 NOTE — Telephone Encounter (Signed)
Medication Refill - Medication: albuterol (VENTOLIN HFA) 108 (90 Base) MCG/ACT inhaler and Tiotropium Bromide Monohydrate (SPIRIVA RESPIMAT) 2.5 MCG/ACT AERS  Has the patient contacted their pharmacy? Yes.   Pt told to contact provider  Preferred Pharmacy (with phone number or street name):  [facility] - [place], [place] Phone:  [phone]  Fax:  [phone]     Has the patient been seen for an appointment in the last year OR does the patient have an upcoming appointment? Yes.    Agent: Please be advised that RX refills may take up to 3 business days. We ask that you follow-up with your pharmacy.

## 2021-11-08 MED ORDER — ALBUTEROL SULFATE HFA 108 (90 BASE) MCG/ACT IN AERS: 2.0000 | INHALATION_SPRAY | RESPIRATORY_TRACT | 2 refills | Status: DC | PRN

## 2021-11-08 MED ORDER — SPIRIVA RESPIMAT 2.5 MCG/ACT IN AERS: INHALATION_SPRAY | RESPIRATORY_TRACT | 2 refills | Status: DC

## 2021-11-08 NOTE — Telephone Encounter (Signed)
Requested Prescriptions  Pending Prescriptions Disp Refills  . Tiotropium Bromide Monohydrate (SPIRIVA RESPIMAT) 2.5 MCG/ACT AERS 4 g 2    Sig: INHHALE TWO PUFFS INTO LUNGS DAILY     Pulmonology:  Anticholinergic Agents Passed - 11/07/2021  3:47 PM      Passed - Valid encounter within last 12 months    Recent Outpatient Visits          2 weeks ago Abscess   [facility] Delsa Grana, PA-C   2 months ago COPD with acute exacerbation [facility])   [facility] Delsa Grana, PA-C   2 months ago MRSA infection   [facility] Steele Sizer, MD   3 months ago Abscess of left arm   [facility] Delsa Grana, PA-C   4 months ago [facility] Delsa Grana, PA-C      Future Appointments            In 5 days Reece Packer, Myna Hidalgo, [facility], [place]   In 6 days Tsosie Billing, MD [place]   In 4 weeks Delsa Grana, PA-C [facility], [place]   In 4 months  [facility], [facility]           . albuterol (VENTOLIN HFA) 108 (90 Base) MCG/ACT inhaler 18 g 2    Sig: Inhale 2 puffs into the lungs every 4 (four) hours as needed for wheezing or shortness of breath.     Pulmonology:  Beta Agonists 2 Failed - 11/07/2021  3:47 PM      Failed - Last BP in normal range    BP Readings from Last 1 Encounters:  10/24/21 (!) 157/68         Passed - Last Heart Rate in normal range    Pulse Readings from Last 1 Encounters:  10/24/21 64         Passed - Valid encounter within last 12 months    Recent Outpatient Visits          2 weeks ago Abscess   [facility] Delsa Grana, PA-C   2 months ago COPD with acute exacerbation [facility])   [facility] Delsa Grana, PA-C   2 months ago MRSA infection   [facility] Steele Sizer, MD   3 months ago Abscess of left arm    [facility] Delsa Grana, PA-C   4 months ago [facility] Delsa Grana, PA-C      Future Appointments            In 5 days Reece Packer, Myna Hidalgo, [facility], [place]   In 6 days Tsosie Billing, MD [place]   In 4 weeks Delsa Grana, PA-C [facility], [place]   In 4 months  [facility], [facility]

## 2021-11-09 ENCOUNTER — Ambulatory Visit: Payer: Medicare Other | Admitting: Infectious Diseases

## 2021-11-13 ENCOUNTER — Ambulatory Visit (INDEPENDENT_AMBULATORY_CARE_PROVIDER_SITE_OTHER): Payer: Medicare Other | Admitting: Nurse Practitioner

## 2021-11-13 ENCOUNTER — Encounter: Payer: Self-pay | Admitting: Nurse Practitioner

## 2021-11-13 ENCOUNTER — Other Ambulatory Visit: Payer: Self-pay

## 2021-11-13 VITALS — BP 126/72 | HR 94 | Temp 97.8°F | Resp 18 | Ht 61.0 in | Wt 233.5 lb

## 2021-11-13 DIAGNOSIS — B372 Candidiasis of skin and nail: Secondary | ICD-10-CM | POA: Diagnosis not present

## 2021-11-13 DIAGNOSIS — L0291 Cutaneous abscess, unspecified: Secondary | ICD-10-CM

## 2021-11-13 DIAGNOSIS — Z23 Encounter for immunization: Secondary | ICD-10-CM | POA: Diagnosis not present

## 2021-11-13 DIAGNOSIS — Z22322 Carrier or suspected carrier of Methicillin resistant Staphylococcus aureus: Secondary | ICD-10-CM | POA: Diagnosis not present

## 2021-11-13 DIAGNOSIS — B379 Candidiasis, unspecified: Secondary | ICD-10-CM | POA: Diagnosis not present

## 2021-11-13 DIAGNOSIS — T3695XA Adverse effect of unspecified systemic antibiotic, initial encounter: Secondary | ICD-10-CM

## 2021-11-13 MED ORDER — OXYCODONE-ACETAMINOPHEN 5-325 MG PO TABS: 1.0000 | ORAL_TABLET | ORAL | 0 refills | Status: AC | PRN

## 2021-11-13 MED ORDER — NYSTATIN-TRIAMCINOLONE 100000-0.1 UNIT/GM-% EX OINT: 1.0000 | TOPICAL_OINTMENT | Freq: Two times a day (BID) | CUTANEOUS | 0 refills | Status: DC

## 2021-11-13 MED ORDER — CLOTRIMAZOLE 1 % VA CREA: 1.0000 | TOPICAL_CREAM | Freq: Every day | VAGINAL | 0 refills | Status: AC

## 2021-11-13 NOTE — Assessment & Plan Note (Signed)
Has appointment with infectious disease tomorrow.

## 2021-11-13 NOTE — Progress Notes (Signed)
BP 126/72   Pulse 94   Temp 97.8 F (36.6 C) (Oral)   Resp 18   Ht '5\' 1"'$  (1.549 m)   Wt 233 lb 8 oz (105.9 kg)   LMP  (LMP Unknown)   BMI 44.12 kg/m    Subjective:    Patient ID: Nicole Austin, female    DOB: 05/12/1960, 61 y.o.   MRN: [id number]  HPI: Nicole Austin is a 61 y.o. female  Chief Complaint  Patient presents with   Follow-up    Emergency Room   Vaginitis   ER follow up/abscess/ MRSA: Patient was seen by [name], PA on 10/19/2021 for an abscess in her bilateral axilla.  Patient has had a few MRSA infections in the past.  At that time a referral was placed to infectious disease and dermatology.  Patient ended up going to the ER on 10/24/2021.  According to ER chart cyst was draining so no I&D was performed.  Patient was started on doxycycline and a wound culture was collected.  Wound culture did grow back MRSA. Patient has completed doxycycline.  She has an appointment with infectious disease on 11/14/2021. Patient was also give a prescription of percocet for pain.  She would like a refill.  She states the pain has continued to be bad making it difficult for her to sleep at night.  Gust with patient I would continue her Percocet prescription for couple of days but she would not get a refill after that.  Patient's abscess is located in bilateral axilla are healing.  No signs of redness or drainage.  Antibiotic induced yeast infection/ yeast in groin: Patient reports she has white vaginal discharge.  She has been on doxycycline.  Discussed it is an antibiotic induced yeast infection.  Discussed with patient that she could not do the diflucan due to prolonged QT syndrome and current medications she is taking.  Patient also has a yeast tightness in her groin.  She says she has been doing nystatin powder and it has not helped.  We will send in prescription for nystatin-triamcinolone ointment for her groin area.   Relevant past medical, surgical, family and social  history reviewed and updated as indicated. Interim medical history since our last visit reviewed. Allergies and medications reviewed and updated.  Review of Systems  Constitutional: Negative for fever or weight change.  Respiratory: Negative for cough and shortness of breath.   Cardiovascular: Negative for chest pain or palpitations.  Gastrointestinal: Negative for abdominal pain, no bowel changes.  Musculoskeletal: Negative for gait problem or joint swelling.  Skin: positive for rash.  Neurological: Negative for dizziness or headache.  No other specific complaints in a complete review of systems (except as listed in HPI above).      Objective:    BP 126/72   Pulse 94   Temp 97.8 F (36.6 C) (Oral)   Resp 18   Ht '5\' 1"'$  (1.549 m)   Wt 233 lb 8 oz (105.9 kg)   LMP  (LMP Unknown)   BMI 44.12 kg/m   Wt Readings from Last 3 Encounters:  11/13/21 233 lb 8 oz (105.9 kg)  10/24/21 235 lb 7.2 oz (106.8 kg)  09/01/21 235 lb 6.4 oz (106.8 kg)    Physical Exam  Constitutional: Patient appears well-developed and well-nourished. Obese  No distress.  HEENT: head atraumatic, normocephalic, pupils equal and reactive to light, neck supple Cardiovascular: Normal rate, regular rhythm and normal heart sounds.  No murmur heard. No  BLE edema. Pulmonary/Chest: Effort normal and breath sounds normal. No respiratory distress. Abdominal: Soft.  There is no tenderness. SKIN: Yeast dermatitis noted in groins, bilateral axilla abscesses healing no signs of redness or drainage Psychiatric: Patient has a normal mood and affect. behavior is normal. Judgment and thought content normal.  Results for orders placed or performed during the hospital encounter of 10/24/21  Aerobic Culture w Gram Stain (superficial specimen)   Specimen: Axilla  Result Value Ref Range   Specimen Description      AXILLA Performed at [facility], [address] [place], [place] [address]    Special Requests       AXILLA Performed at [facility], [address] [place], [place] [address]    Gram Stain      NO WBC SEEN RARE Pratt Performed at [facility], [place] [address] [place],  [address]    Culture      ABUNDANT METHICILLIN RESISTANT STAPHYLOCOCCUS AUREUS   Report Status 10/26/2021 FINAL    Organism ID, Bacteria METHICILLIN RESISTANT STAPHYLOCOCCUS AUREUS       Susceptibility   Methicillin resistant staphylococcus aureus - MIC*    CIPROFLOXACIN >=8 RESISTANT Resistant     ERYTHROMYCIN >=8 RESISTANT Resistant     GENTAMICIN <=0.5 SENSITIVE Sensitive     OXACILLIN >=4 RESISTANT Resistant     TETRACYCLINE >=16 RESISTANT Resistant     VANCOMYCIN <=0.5 SENSITIVE Sensitive     TRIMETH/SULFA 80 RESISTANT Resistant     CLINDAMYCIN <=0.25 SENSITIVE Sensitive     RIFAMPIN <=0.5 SENSITIVE Sensitive     Inducible Clindamycin NEGATIVE Sensitive     * ABUNDANT METHICILLIN RESISTANT STAPHYLOCOCCUS AUREUS      Assessment & Plan:   Problem List Items Addressed This Visit       Other   MRSA (methicillin resistant staph aureus) culture positive    Has appointment with infectious disease tomorrow.       Other Visit Diagnoses     Abscess    -  Primary   axilla abscesses are healing, no redness or drainage noted, finished doxycycline.  has appointment with infectious disease tomorrow   Relevant Medications   oxyCODONE-acetaminophen (PERCOCET) 5-325 MG tablet   Need for influenza vaccination       Relevant Orders   Flu Vaccine QUAD 6+ mos PF IM (Fluarix Quad PF) (Completed)   Antibiotic-induced yeast infection       Relevant Medications   clotrimazole (GYNE-LOTRIMIN) 1 % vaginal cream   nystatin-triamcinolone ointment (MYCOLOG)   Yeast dermatitis       Relevant Medications   nystatin-triamcinolone ointment (MYCOLOG)        Follow up plan: Return if symptoms worsen or fail to improve.

## 2021-11-14 ENCOUNTER — Ambulatory Visit: Payer: Medicare Other | Attending: Infectious Diseases | Admitting: Infectious Diseases

## 2021-11-14 ENCOUNTER — Encounter: Payer: Self-pay | Admitting: Infectious Diseases

## 2021-11-14 VITALS — BP 138/83 | HR 73 | Temp 97.3°F | Ht 61.0 in | Wt 232.0 lb

## 2021-11-14 DIAGNOSIS — Z1624 Resistance to multiple antibiotics: Secondary | ICD-10-CM | POA: Insufficient documentation

## 2021-11-14 DIAGNOSIS — A4902 Methicillin resistant Staphylococcus aureus infection, unspecified site: Secondary | ICD-10-CM

## 2021-11-14 DIAGNOSIS — E669 Obesity, unspecified: Secondary | ICD-10-CM | POA: Diagnosis not present

## 2021-11-14 DIAGNOSIS — B9562 Methicillin resistant Staphylococcus aureus infection as the cause of diseases classified elsewhere: Secondary | ICD-10-CM | POA: Diagnosis not present

## 2021-11-14 DIAGNOSIS — F32A Depression, unspecified: Secondary | ICD-10-CM | POA: Insufficient documentation

## 2021-11-14 DIAGNOSIS — L042 Acute lymphadenitis of upper limb: Secondary | ICD-10-CM | POA: Diagnosis not present

## 2021-11-14 DIAGNOSIS — Z6841 Body Mass Index (BMI) 40.0 and over, adult: Secondary | ICD-10-CM | POA: Insufficient documentation

## 2021-11-14 DIAGNOSIS — Z79899 Other long term (current) drug therapy: Secondary | ICD-10-CM | POA: Insufficient documentation

## 2021-11-14 MED ORDER — CHLORHEXIDINE GLUCONATE 2 % EX LIQD: 15.0000 mL | Freq: Every day | CUTANEOUS | 0 refills | Status: AC

## 2021-11-14 MED ORDER — LINEZOLID 600 MG PO TABS: 600.0000 mg | ORAL_TABLET | Freq: Two times a day (BID) | ORAL | 0 refills | Status: DC

## 2021-11-14 MED ORDER — MUPIROCIN 2 % EX OINT: 1.0000 | TOPICAL_OINTMENT | Freq: Two times a day (BID) | CUTANEOUS | 0 refills | Status: DC

## 2021-11-14 NOTE — Progress Notes (Signed)
NAME: Nicole Austin  DOB: [birth year]  MRN: [id number]  Date/Time: 11/14/2021 11:47 AM   Subjective:    ? Nicole Austin is a 61 y.o. female is referred to me for recurrent MRSA skin infection. She currently has an small nodle on the left axillae Culture from 10/24/21 was MRSA and resistant to Doxy/bactrim- she recently took doxy She has no fever No diabetes Obese Smoker Has a cat  Past Medical History:  Diagnosis Date   ADHD (attention deficit hyperactivity disorder)    Apnea, sleep 06/30/2015   Arthritis    Asthma    Asthma with acute exacerbation 06/30/2015   Bipolar 1 disorder (HCC)    Chronic pain    COPD (chronic obstructive pulmonary disease) ([place]) 11/2018   **LUNG SOUNDS WHEEZING AND WITH RALES**   COPD, moderate ([place]) 11/18/2013   Overview:  Last Assessment & Plan:  Refer to pulmonologist Overview:  Overview:  Last Assessment & Plan:  Pneumonia vaccine offered and given today; she did not tolerate LABA; will start her on Spiriva; stop combivent neb; use only SABA neb OR inhaler, not both; smoking cessation urged, and I am here to help if she wants assistance quitting; f/u in 4 weeks Last Assessment & Plan:  Pneumonia vaccin   Depression    Dyspnea    with exertion   GERD (gastroesophageal reflux disease) 08/30/2015   Gout 08/16/2014   History of acute myocardial infarction 06/30/2015   Overview:  [facility]    HPV (human papilloma virus) infection 07/17/2013   Hyperlipidemia    Low HDL (under 40) 08/30/2015   MI (myocardial infarction) ([place]) 2018   Parkinson disease ([place])    Pre-diabetes    Prediabetes 05/20/2016   PTSD (post-traumatic stress disorder)    history of panic attacks   PTSD (post-traumatic stress disorder)    PTSD (post-traumatic stress disorder)    Restless leg syndrome    Rhabdomyolysis    Sleep apnea 06/30/2015   Stage 3 severe COPD by GOLD classification ([place]) 11/18/2013   Stroke ([place]) 2018   Tremors of nervous system    hands   Uncomplicated asthma  10/29/7900   Varicella 02/25/2017    Past Surgical History:  Procedure Laterality Date   BACK SURGERY     CATARACT EXTRACTION W/PHACO Right 01/01/2019   Procedure: CATARACT EXTRACTION PHACO AND INTRAOCULAR LENS PLACEMENT ([place]) right vision blue;  Surgeon: Marchia Meiers, MD;  Location: [facility] ORS;  Service: Ophthalmology;  Laterality: Right;  [place] 00:39.4 CDE 5.49 Fluid Pack lot # [id number]   CATARACT EXTRACTION W/PHACO Left 01/29/2019   Procedure: CATARACT EXTRACTION PHACO AND INTRAOCULAR LENS PLACEMENT ([place]) LEFT Vision Blue;  Surgeon: Marchia Meiers, MD;  Location: [facility] ORS;  Service: Ophthalmology;  Laterality: Left;  [place] 00:51.1 CDE 4.38 Fluid Pack Lot # [id number]   COLONOSCOPY WITH PROPOFOL N/A 08/07/2017   Procedure: COLONOSCOPY WITH PROPOFOL;  Surgeon: Virgel Manifold, MD;  Location: [facility] ENDOSCOPY;  Service: Endoscopy;  Laterality: N/A;   EYE SURGERY     JOINT REPLACEMENT Left 2016   tkr   REPLACEMENT TOTAL KNEE Left 2016    Social History   Socioeconomic History   Marital status: Single    Spouse name: marianne - sister   Number of children: 3   Years of education: Not on file   Highest education level: 10th grade  Occupational History   Occupation: Disability  Tobacco Use   Smoking status: Every Day    Packs/day: 0.50    Years: 36.00  Total pack years: 18.00    Types: Cigarettes    Passive exposure: Past   Smokeless tobacco: Never   Tobacco comments:    Pt using nicotine patches; 1/2 PPD as of 03/21/21  Vaping Use   Vaping Use: Former  Substance and Sexual Activity   Alcohol use: No    Alcohol/week: 0.0 standard drinks of alcohol   Drug use: No   Sexual activity: Not Currently  Other Topics Concern   Not on file  Social History Narrative   Pt lives alone   Social Determinants of Health   Financial Resource Strain: Low Risk  (04/25/2020)   Overall Financial Resource Strain (CARDIA)    Difficulty of Paying Living Expenses: Not hard at all  Recent Concern:  Financial Resource Strain - Medium Risk (03/17/2020)   Overall Financial Resource Strain (CARDIA)    Difficulty of Paying Living Expenses: Somewhat hard  Food Insecurity: No Food Insecurity (03/17/2020)   Hunger Vital Sign    Worried About Running Out of Food in the Last Year: Never true    Ran Out of Food in the Last Year: Never true  Transportation Needs: Unmet Transportation Needs (03/17/2020)   PRAPARE - Transportation    Lack of Transportation (Medical): Yes    Lack of Transportation (Non-Medical): Yes  Physical Activity: Inactive (03/21/2021)   Exercise Vital Sign    Days of Exercise per Week: 0 days    Minutes of Exercise per Session: 0 min  Stress: Stress Concern Present (03/21/2021)   [place]    Feeling of Stress : Very much  Social Connections: Socially Isolated (03/21/2021)   Social Connection and Isolation Panel [NHANES]    Frequency of Communication with Friends and Family: More than three times a week    Frequency of Social Gatherings with Friends and Family: More than three times a week    Attends Religious Services: Never    Marine scientist or Organizations: No    Attends Archivist Meetings: Never    Marital Status: Divorced  Human resources officer Violence: Not At Risk (03/21/2021)   Humiliation, Afraid, Rape, and Kick questionnaire    Fear of Current or Ex-Partner: No    Emotionally Abused: No    Physically Abused: No    Sexually Abused: No    Family History  Problem Relation Age of Onset   Hernia Mother    Heart disease Mother    OCD Mother    Diabetes Mother    Parkinson's disease Father    Bipolar disorder Sister    Schizophrenia Sister    ADD / ADHD Sister    Alcohol abuse Brother    Bipolar disorder Sister    Paranoid behavior Sister    ADD / ADHD Sister    ADD / ADHD Son    Dementia Maternal Grandmother    Emphysema Maternal Grandfather    ADD / ADHD Son    ADD / ADHD  Son    Depression Son    Allergies  Allergen Reactions   Chantix [Varenicline] Nausea Only   I? Current Outpatient Medications  Medication Sig Dispense Refill   albuterol (VENTOLIN HFA) 108 (90 Base) MCG/ACT inhaler Inhale 2 puffs into the lungs every 4 (four) hours as needed for wheezing or shortness of breath. 18 g 2   ALPRAZolam (XANAX) 0.5 MG tablet Take 0.5 mg by mouth 4 (four) times daily.     atorvastatin (LIPITOR) 40 MG tablet TAKE  1 TABLET BY MOUTH EVERY NIGHT AT BEDTIME 90 tablet 3   clotrimazole (GYNE-LOTRIMIN) 1 % vaginal cream Place 1 Applicatorful vaginally at bedtime for 7 days. 45 g 0   divalproex (DEPAKOTE) 250 MG DR tablet Take 1 tablet (250 mg total) by mouth 2 (two) times daily. 60 tablet 2   doxycycline (VIBRA-TABS) 100 MG tablet Take 1 tablet (100 mg total) by mouth 2 (two) times daily. 20 tablet 0   famotidine (PEPCID) 20 MG tablet TAKE 1 TABLET BY MOUTH 2 TIMES DAILY AS NEEDED FOR NAUSEA/INDIGESTION/STOMACH UPSET. 60 tablet 1   ipratropium-albuterol (DUONEB) 0.5-2.5 (3) MG/3ML SOLN Take 3 mLs by nebulization 3 (three) times daily as needed. 180 mL 1   lurasidone (LATUDA) 80 MG TABS tablet Take 1 tablet (80 mg total) by mouth daily with breakfast. 90 tablet 0   meloxicam (MOBIC) 15 MG tablet Take 15 mg by mouth daily.     mometasone-formoterol (DULERA) 100-5 MCG/ACT AERO Inhale 2 puffs into the lungs in the morning and at bedtime. 1 each 3   Multiple Vitamin (MULTIVITAMIN WITH MINERALS) TABS tablet Take 1 tablet by mouth daily.     nystatin (MYCOSTATIN/NYSTOP) powder Apply 1 application. topically 3 (three) times daily as needed (rash in skin folds). 60 g 1   nystatin ointment (MYCOSTATIN) Apply 1 application topically 2 (two) times daily. 30 g 0   nystatin-triamcinolone ointment (MYCOLOG) Apply 1 Application topically 2 (two) times daily. 30 g 0   oxyCODONE-acetaminophen (PERCOCET) 5-325 MG tablet Take 1 tablet by mouth every 4 (four) hours as needed for up to 3 days  for severe pain. 12 tablet 0   promethazine (PHENERGAN) 25 MG tablet Take 25 mg by mouth 2 (two) times daily as needed.     Tiotropium Bromide Monohydrate (SPIRIVA RESPIMAT) 2.5 MCG/ACT AERS INHHALE TWO PUFFS INTO LUNGS DAILY 4 g 2   tiZANidine (ZANAFLEX) 4 MG tablet Take 1 tablet (4 mg total) by mouth 3 (three) times daily as needed for muscle spasms. 30 tablet 0   escitalopram (LEXAPRO) 20 MG tablet Take 1 tablet (20 mg total) by mouth daily. 30 tablet 2   No current facility-administered medications for this visit.     Abtx:  Anti-infectives (From admission, onward)    None       REVIEW OF SYSTEMS:  Const: negative fever, negative chills, negative weight loss Eyes: negative diplopia or visual changes, negative eye pain ENT: negative coryza, negative sore throat Resp: negative cough, hemoptysis, dyspnea Cards: negative for chest pain, palpitations, lower extremity edema GU: negative for frequency, dysuria and hematuria GI: Negative for abdominal pain, diarrhea, bleeding, constipation Skin:as above Heme: negative for easy bruising and gum/nose bleeding AS:[id number] Neurolo:negative for headaches, dizziness, vertigo, memory problems  Psych: negative for feelings of anxiety, depression  Endocrine: r thyroid, diabetes Allergy/Immunology- as above- chantix Objective:  VITALS:  BP 138/83   Pulse 73   Temp (!) 97.3 F (36.3 C) (Temporal)   Ht '5\' 1"'$  (1.549 m)   Wt 232 lb (105.2 kg)   LMP  (LMP Unknown)   BMI 43.84 kg/m   PHYSICAL EXAM:  General: Alert, cooperative, no distress, appears stated age. obese Head: Normocephalic, without obvious abnormality, atraumatic. Eyes: Conjunctivae clear, anicteric sclerae. Pupils are equal ENT Nares normal. No drainage or sinus tenderness. Lips, mucosa, and tongue normal. No Thrush Neck: Supple, symmetrical, no adenopathy, thyroid: non tender no carotid bruit and no JVD. Back: No CVA tenderness. Lungs: Clear to auscultation  bilaterally. No Wheezing or  Rhonchi. No rales. Heart: Regular rate and rhythm, no murmur, rub or gallop. Abdomen: Soft, non-tender,not distended. Bowel sounds normal. No masses Extremities: atraumatic, no cyanosis. No edema. No clubbing Skin: left axilla- small nodular abscess developing- no fluctuation   Rt groin intertrigo  Lymph: Cervical, supraclavicular normal. Neurologic: Grossly non-focal Pertinent Labs Lab Results CBC    Component Value Date/Time   WBC 6.5 08/22/2021 1640   RBC 3.96 08/22/2021 1640   HGB 11.7 (L) 08/22/2021 1640   HGB 10.9 (L) 06/15/2014 0625   HCT 35.9 (L) 08/22/2021 1640   HCT 32.9 (L) 06/15/2014 0625   PLT 243 08/22/2021 1640   PLT 219 06/15/2014 0625   MCV 90.7 08/22/2021 1640   MCV 92 06/15/2014 0625   MCH 29.5 08/22/2021 1640   MCHC 32.6 08/22/2021 1640   RDW 12.7 08/22/2021 1640   RDW 15.0 (H) 06/15/2014 0625   LYMPHSABS 2.3 08/15/2021 1641   LYMPHSABS 1.5 06/15/2014 0625   MONOABS 0.7 08/15/2021 1641   MONOABS 0.7 06/15/2014 0625   EOSABS 0.8 (H) 08/15/2021 1641   EOSABS 0.2 06/15/2014 0625   BASOSABS 0.1 08/15/2021 1641   BASOSABS 0.0 06/15/2014 0625       Latest Ref Rng & Units 09/01/2021    4:14 PM 08/22/2021    4:40 PM 08/15/2021    4:41 PM  CMP  Glucose 65 - 99 mg/dL 89  90  93   BUN 7 - 25 mg/dL '12  12  9   '$ Creatinine 0.50 - 1.05 mg/dL 1.23  1.26  0.93   Sodium 135 - 146 mmol/L 134  130  134   Potassium 3.5 - 5.3 mmol/L 4.7  4.2  4.4   Chloride 98 - 110 mmol/L 99  96  97   CO2 20 - 32 mmol/L '29  26  29   '$ Calcium 8.6 - 10.4 mg/dL 9.9  8.4  9.3   Total Protein 6.1 - 8.1 g/dL 6.7     Total Bilirubin 0.2 - 1.2 mg/dL 0.3     AST 10 - 35 U/L 24     ALT 6 - 29 U/L 17         Microbiology: 10/24/21- wound MRSA   ? Impression/Recommendation ?Recurrent MRSA skin infection Has an active infeciton left axilla Will do linezolid as MRSA resisatnt to other oral antibiotics except clinda ( which may become resistant if  started) Discussed interaction with lexapro and to identify symptoms suggestive of Serotonin syndrome- Also discussed foods and alcohol to avoid Gave printed material on linezolid  At the end of the antibiotic therapy she will do an elaborate decolonization protocol with mupirocin and CHG bathing as below Prepare Chose a period when you will be uninterrupted by going away or other distractions. To ensure that your skin is in good condition, follow the Routine Skin Care principles below to reduce drying and enhance healing. Do not start while you have any active boils. Routine Skin Care principles to reduce drying and enhance healing: Avoid the use of soap when bathing or showering when performing this decolonization. DO NOT routinely use antiseptic solutions. If you need to use something, chose a soap substitute (examples -QV Wash or Cetaphil).  When drying with a towel, be gentle and pat dry your skin. Avoid rubbing the skin.  Reduce the overall frequency of bathing or showering. A short shower (3 minutes) is better than a bath in terms of its effect on the skin.  Use a simple sorbelene based-cream on  your skin prior to showering and immediately after drying. (Examples: Hydroderm or other Sorbelene-based preparations). Especially protect healing or dry areas of skin in this way. Don't use a barrier cream with a vaseline base or with perfumes and additives. You are more likely to be allergic to these products, and cause further damage to your skin.  Make sure that you clean and cover any skin cuts or grazes that occur. Try to avoid picking or biting fingernails and the skin around the nails Keep your fingernails clipped short and clean to reduce problems caused by scratching.  For itchy skin, try gently massaging sorbelene-based cream into itchy areas instead of scratching it. A long-acting non-sedating antihistamine drug is the next option to try. However make sure that you are not taking medication  that will interact with this drug type.                                                                         To prepare yourself for your treatment, it is recommended that you complete the following steps: Remove nose, ear and other body piercing items for several days prior to the treatment and keep them out during the treatment period  Purchase a new toothbrush, disposable razor (if used), sterident for dentures (if required) and a container of alcohol hand hygiene solution (gel or rub)  Discard old toothbrushes and razors when the treatment starts. Also discard opened deodorant rollers, skin adhesive tapes, skin creams and solutions- all of these may already be contaminated with staph  Discard pumice stone(s), sponges and disposable face cloths if used   Discard all make-up brushes, creams, and implements  Discard or hot wash all fluffy toys  Wash hair brush and comb, nail files, plastic toys and cutters in the dishwasher or purchase new ones  Remove nail varnish and artificial nails  Daily routine for 5 days     *Minimize contact with members of the community during the 5 days of treatment as much as possible* Body washes The effectiveness of the program increases if the correct procedure is used: Apply the provided antiseptic body wash (2% chlorhexidine) in the shower daily  Take care to wash hair, under the arms and into the groin and into any folds of skin  Allow the antiseptic to remain on the skin for at least 5 minutes  Nasal ointment Disinfect your hands with alcohol gel/rub and allow to dry   Open the mupirocin 2% (Bactroban) nasal ointment.  Place small amount (size of match head) of ointment onto a clean cotton swab and massage gently around the inside of the nostril on one side, making sure not to insert it too deeply (no more than 2 cm or a little less than an inch).  Use a new cotton bud for the other nostril so that you do not contaminate the Bactroban tube with staph.    After applying the ointment, press a finger against the nose next to the nostril opening and use a circular motion to spread the ointment within the nose  Apply the mupirocin ointment two times a day for 5 days  Disinfect your hands with alcohol rub/gel after applying the ointmentp>  Personal items (Zenon, razor, eyeglasses, jewelry, etc.)  Disinfect all personal items daily with an alcohol-based cleanser  House Environment and Clothes/Linens On day 2 and 5 of the treatment, clean your house, (especially the bedroom and bathroom). Clean dust off all surfaces and then vacuum clean all floor surfaces AND soft furnishings (such as your favorite chair). If your chair/couch has a vinyl or leather covering then wipe over the chair with warm soapy water and then dry with a clean towel (which should then be washed). Staph lives in skin scales from humans that contaminate the environment. This can lead to re-infection.   Disinfect the shower floor and/or bath tub daily   On days 1, 3, AND 5 of the treatment wash your clothes, underwear, pajamas and bed linen (such as towels, sheets, washcloths, and bath mats). A hot wash with laundry detergent is best (there is no need to use expensive laundry detergent or powder). Dry clothes in sun if possible. Change into clean clothes or pajamas on those days after your shower.   Do not share or exchange any personal items of clothing  Sports/Gym     Surfaces, equipment and towels, and skin-to-skin contact are all potential sources for staph re-infection Pets Dogs and other companion animals can also be colonized with the same strains of MRSA. Best to wash or replace bedding material for the animal and wash the animal at least once during the treatment period with antiseptic solution (2% chlorhexidine wash). Ensure that the skin of the animal is kept in good condition. If the pet has any chronic skin disorders, consult your vet prior to starting your treatment  process. What about my partner, family, or household members? Usually when an aggressive strain of staph moves in to a family or household, only certain members of that group get infections (boils). This is despite the fact that the strain has probably transferred itself from person to person within the group. Those without boils may also be carrying the bacterium, however they must have better resistance (immunity) or perhaps have better skin condition. Staph likes to invade through cuts, scratches and skin with dermatitis or dryness.    Follow-up after decolonization treatment  Possible approaches will vary depending on how your treatment goes. Your provider will instruct you on the follow-up best suited for you. Some options are:  Wait and see - if no further boils occur within 6 months then it is probable that the strain of staph has been eliminated from you.   Continue intermittent body washes 1-2 times per week with 2% aqueous chlorhexidine soap preparation or similar   Future antibiotic use  The best preventative approach to avoiding future problems may be to avoid use of antibiotics unless there is a strong indication. Antibiotics are often prescribed for minor infections or for respiratory infections that are mostly due to viruses. It is in your best interest to ride these infections out rather than taking antibiotics. Taking antibiotics alters your natural bacterial flora on your skin and in your gut. This may reduce your resistance against acquiring a resistant bacterial strain such as MRSA).   Important note: if you do become very ill with possible infection and require hospital review, it is important for you to remind your medical care providers that you have been colonized with MRSA in the past as they may have to use antibiotics that are active against the strain that you had previously.   References Wiese-Posselt et al. Clin Inf Dis 3086:57:Q46  CDC:   StyleTurn.[email or web address].html ? ? ___________________________________________________ Discussed with  patient,in detail Follow as needed  Note:  This document was prepared using Dragon voice recognition software and may include unintentional dictation errors.

## 2021-11-14 NOTE — Patient Instructions (Addendum)
You are here for recurrent MRSA infection- now you have it under your left arm- as Doxy, bactrim resistant ai am sending a prescription for linezolid '600mg'$  tablet taken twice a day- While on linezolid reduce lexapro to '10mg'$  a day. Avoid foods like fermented cheese vinegar, sauerkraut, red wine and too much coffee- if you experience shaking, tremors, palpitations, nervousness stop the antibiotic I have attached the steps below to reduce MRSA and also given material to read about the antibiotic    Prepare Chose a period when you will be uninterrupted by going away or other distractions. To ensure that your skin is in good condition, follow the Routine Skin Care principles below to reduce drying and enhance healing. Do not start while you have any active boils. Routine Skin Care principles to reduce drying and enhance healing: Avoid the use of soap when bathing or showering when performing this decolonization. DO NOT routinely use antiseptic solutions. If you need to use something, chose a soap substitute (examples -QV Wash or Cetaphil).  When drying with a towel, be gentle and pat dry your skin. Avoid rubbing the skin.  Reduce the overall frequency of bathing or showering. A short shower (3 minutes) is better than a bath in terms of its effect on the skin.  Use a simple sorbelene based-cream on your skin prior to showering and immediately after drying. (Examples: Hydroderm or other Sorbelene-based preparations). Especially protect healing or dry areas of skin in this way. Don't use a barrier cream with a vaseline base or with perfumes and additives. You are more likely to be allergic to these products, and cause further damage to your skin.  Make sure that you clean and cover any skin cuts or grazes that occur. Try to avoid picking or biting fingernails and the skin around the nails Keep your fingernails clipped short and clean to reduce problems caused by scratching.  For itchy skin, try gently massaging  sorbelene-based cream into itchy areas instead of scratching it. A long-acting non-sedating antihistamine drug is the next option to try. However make sure that you are not taking medication that will interact with this drug type.                                                                         To prepare yourself for your treatment, it is recommended that you complete the following steps: Remove nose, ear and other body piercing items for several days prior to the treatment and keep them out during the treatment period  Purchase a new toothbrush, disposable razor (if used), sterident for dentures (if required) and a container of alcohol hand hygiene solution (gel or rub)  Discard old toothbrushes and razors when the treatment starts. Also discard opened deodorant rollers, skin adhesive tapes, skin creams and solutions- all of these may already be contaminated with staph  Discard pumice stone(s), sponges and disposable face cloths if used   Discard all make-up brushes, creams, and implements  Discard or hot wash all fluffy toys  Wash hair brush and comb, nail files, plastic toys and cutters in the dishwasher or purchase new ones  Remove nail varnish and artificial nails  Daily routine for7 days     *Minimize contact with  members of the community during the 7 days of treatment as much as possible* Body washes The effectiveness of the program increases if the correct procedure is used: Apply the provided antiseptic body wash (2% chlorhexidine) in the shower daily  Take care to wash hair, under the arms and into the groin and into any folds of skin  Allow the antiseptic to remain on the skin for at least 5 minutes  Nasal ointment Disinfect your hands with alcohol gel/rub and allow to dry   Open the mupirocin 2% (Bactroban) nasal ointment.  Place small amount (size of match head) of ointment onto a clean cotton swab and massage gently around the inside of the nostril on one side, making sure  not to insert it too deeply (no more than 2 cm or a little less than an inch).  Use a new cotton bud for the other nostril so that you do not contaminate the Bactroban tube with staph.   After applying the ointment, press a finger against the nose next to the nostril opening and use a circular motion to spread the ointment within the nose  Apply the mupirocin ointment two times a day for 5 days  Disinfect your hands with alcohol rub/gel after applying the ointmentp>  Personal items (Bechtol, razor, eyeglasses, jewelry, etc.)     Disinfect all personal items daily with an alcohol-based cleanser  House Environment and Clothes/Linens On day 2 and 5 of the treatment, clean your house, (especially the bedroom and bathroom). Clean dust off all surfaces and then vacuum clean all floor surfaces AND soft furnishings (such as your favorite chair). If your chair/couch has a vinyl or leather covering then wipe over the chair with warm soapy water and then dry with a clean towel (which should then be washed). Staph lives in skin scales from humans that contaminate the environment. This can lead to re-infection.   Disinfect the shower floor and/or bath tub daily   On days 1, 3, AND 5 of the treatment wash your clothes, underwear, pajamas and bed linen (such as towels, sheets, washcloths, and bath mats). A hot wash with laundry detergent is best (there is no need to use expensive laundry detergent or powder). Dry clothes in sun if possible. Change into clean clothes or pajamas on those days after your shower.   Do not share or exchange any personal items of clothing  Sports/Gym     Surfaces, equipment and towels, and skin-to-skin contact are all potential sources for staph re-infection Pets Dogs and other companion animals can also be colonized with the same strains of MRSA. Best to wash or replace bedding material for the animal and wash the animal at least once during the treatment period with antiseptic solution  (2% chlorhexidine wash). Ensure that the skin of the animal is kept in good condition. If the pet has any chronic skin disorders, consult your vet prior to starting your treatment process. What about my partner, family, or household members? Usually when an aggressive strain of staph moves in to a family or household, only certain members of that group get infections (boils). This is despite the fact that the strain has probably transferred itself from person to person within the group. Those without boils may also be carrying the bacterium, however they must have better resistance (immunity) or perhaps have better skin condition. Staph likes to invade through cuts, scratches and skin with dermatitis or dryness.    Follow-up after decolonization treatment  Possible approaches will vary  depending on how your treatment goes. Your provider will instruct you on the follow-up best suited for you. Some options are:  Wait and see - if no further boils occur within 6 months then it is probable that the strain of staph has been eliminated from you.   Continue intermittent body washes 1-2 times per week with 2% aqueous chlorhexidine soap preparation or similar   Future antibiotic use  The best preventative approach to avoiding future problems may be to avoid use of antibiotics unless there is a strong indication. Antibiotics are often prescribed for minor infections or for respiratory infections that are mostly due to viruses. It is in your best interest to ride these infections out rather than taking antibiotics. Taking antibiotics alters your natural bacterial flora on your skin and in your gut. This may reduce your resistance against acquiring a resistant bacterial strain such as MRSA).   Important note: if you do become very ill with possible infection and require hospital review, it is important for you to remind your medical care providers that you have been colonized with MRSA in the past as they may have  to use antibiotics that are active against the strain that you had previously.   References Wiese-Posselt et al. Clin Inf Dis [id number]  CDC:  StyleTurn.tn.html

## 2021-11-16 ENCOUNTER — Ambulatory Visit (INDEPENDENT_AMBULATORY_CARE_PROVIDER_SITE_OTHER): Payer: Medicare Other | Admitting: Pulmonary Disease

## 2021-11-16 ENCOUNTER — Encounter: Payer: Self-pay | Admitting: Pulmonary Disease

## 2021-11-16 VITALS — BP 132/78 | HR 89 | Temp 97.8°F | Ht 61.0 in | Wt 233.8 lb

## 2021-11-16 DIAGNOSIS — Z6841 Body Mass Index (BMI) 40.0 and over, adult: Secondary | ICD-10-CM

## 2021-11-16 DIAGNOSIS — J9611 Chronic respiratory failure with hypoxia: Secondary | ICD-10-CM

## 2021-11-16 DIAGNOSIS — J449 Chronic obstructive pulmonary disease, unspecified: Secondary | ICD-10-CM | POA: Diagnosis not present

## 2021-11-16 MED ORDER — BREZTRI AEROSPHERE 160-9-4.8 MCG/ACT IN AERO: 2.0000 | INHALATION_SPRAY | Freq: Two times a day (BID) | RESPIRATORY_TRACT | 11 refills | Status: DC

## 2021-11-16 MED ORDER — BREZTRI AEROSPHERE 160-9-4.8 MCG/ACT IN AERO: 2.0000 | INHALATION_SPRAY | Freq: Two times a day (BID) | RESPIRATORY_TRACT | 0 refills | Status: DC

## 2021-11-16 NOTE — Patient Instructions (Signed)
We are getting some breathing tests ordered.  We will need oxygen at 2 L/min at all times.  We have switched your inhalers to Breztri 2 puffs twice a day, make sure you rinse your mouth well after you use it.  You can still use your albuterol (emergency) inhaler as needed during the day.  We will see you in follow-up in 3 months time call sooner should any new problems arise.

## 2021-11-16 NOTE — Progress Notes (Signed)
Subjective:    Patient ID: Nicole Austin, female    DOB: [birth year], 61 y.o.   MRN: [id number] Patient Care Team: Delsa Grana, PA-C as PCP - General (Family Medicine) Myer Haff, MD as Referring Physician (Psychiatry) Colin Ina Stephani Police, LCSW as Social Worker Sales promotion account executive)  Chief Complaint  Patient presents with   pulmonary consult    Hx of COPD. SOB with exertion, wheezing and non prod cough.    HPI Patient is a 61 year old current smoker who presents for evaluation of shortness of breath and COPD.  She is kindly referred by Nicole Alpha, PA-C.  The patient has noted shortness of breath for a number of years since about a year ago she started worsening with regards to this symptom.  She has been using nocturnal oxygen for approximately 1 year.  She is currently maintained on Dulera, Spiriva and as needed albuterol.  She notes that she gets "panic attacks" and this makes her dyspnea worse.  She has developed issues with shortness of breath with activities of daily living.  She has not had any chest pain.  No calf tenderness.  Lower extremity edema.  No weight loss or anorexia.  She has issues with frequent indigestion.  Also has issues with anxiety and depression.  She has cats as pets in the home.  She is disabled and does not endorse any significant occupational exposure.  No prior military history.   Review of Systems A 10 point review of systems was performed and it is as noted above otherwise negative.  Past Medical History:  Diagnosis Date   ADHD (attention deficit hyperactivity disorder)    Apnea, sleep 06/30/2015   Arthritis    Asthma    Asthma with acute exacerbation 06/30/2015   Bipolar 1 disorder (HCC)    Chronic pain    COPD (chronic obstructive pulmonary disease) ([place]) 11/2018   **LUNG SOUNDS WHEEZING AND WITH RALES**   COPD, moderate ([place]) 11/18/2013   Overview:  Last Assessment & Plan:  Refer to pulmonologist Overview:  Overview:  Last Assessment &  Plan:  Pneumonia vaccine offered and given today; she did not tolerate LABA; will start her on Spiriva; stop combivent neb; use only SABA neb OR inhaler, not both; smoking cessation urged, and I am here to help if she wants assistance quitting; f/u in 4 weeks Last Assessment & Plan:  Pneumonia vaccin   Depression    Dyspnea    with exertion   GERD (gastroesophageal reflux disease) 08/30/2015   Gout 08/16/2014   History of acute myocardial infarction 06/30/2015   Overview:  [facility]    HPV (human papilloma virus) infection 07/17/2013   Hyperlipidemia    Low HDL (under 40) 08/30/2015   MI (myocardial infarction) ([place]) 2018   Parkinson disease ([place])    Pre-diabetes    Prediabetes 05/20/2016   PTSD (post-traumatic stress disorder)    history of panic attacks   PTSD (post-traumatic stress disorder)    PTSD (post-traumatic stress disorder)    Restless leg syndrome    Rhabdomyolysis    Sleep apnea 06/30/2015   Stage 3 severe COPD by GOLD classification ([place]) 11/18/2013   Stroke ([place]) 2018   Tremors of nervous system    hands   Uncomplicated asthma 08/31/6431   Varicella 02/25/2017   Past Surgical History:  Procedure Laterality Date   BACK SURGERY     CATARACT EXTRACTION W/PHACO Right 01/01/2019   Procedure: CATARACT EXTRACTION PHACO AND INTRAOCULAR LENS PLACEMENT (IOC) right vision blue;  Surgeon: Marchia Meiers, MD;  Location: [facility] ORS;  Service: Ophthalmology;  Laterality: Right;  [place] 00:39.4 CDE 5.49 Fluid Pack lot # [id number]   CATARACT EXTRACTION W/PHACO Left 01/29/2019   Procedure: CATARACT EXTRACTION PHACO AND INTRAOCULAR LENS PLACEMENT ([place]) LEFT Vision Blue;  Surgeon: Marchia Meiers, MD;  Location: [facility] ORS;  Service: Ophthalmology;  Laterality: Left;  [place] 00:51.1 CDE 4.38 Fluid Pack Lot # [id number]   COLONOSCOPY WITH PROPOFOL N/A 08/07/2017   Procedure: COLONOSCOPY WITH PROPOFOL;  Surgeon: Virgel Manifold, MD;  Location: [facility] ENDOSCOPY;  Service: Endoscopy;  Laterality: N/A;   EYE SURGERY      JOINT REPLACEMENT Left 2016   tkr   REPLACEMENT TOTAL KNEE Left 2016   Patient Active Problem List   Diagnosis Date Noted   MRSA (methicillin resistant staph aureus) culture positive 05/04/2021   Hyperglycemia 01/01/2021   Prolonged QT interval 01/01/2021   Benign neoplasm of ascending colon    Polyp of sigmoid colon    Bipolar disorder with depression (Nicole Austin) 02/25/2017   ADD (attention deficit disorder) 02/25/2017   Marijuana use 02/13/2017   Neuropathy, peripheral 05/07/2016   Vitamin B12 deficiency 10/24/2015   Fibromyalgia 08/30/2015   Low HDL (under 40) 08/30/2015   GERD (gastroesophageal reflux disease) 08/30/2015   OP (osteoporosis) 06/30/2015   Sleep apnea 06/30/2015   Morbid (severe) obesity due to excess calories (Nicole Austin) 08/16/2014   Low back pain with sciatica 08/04/2014   Current tobacco use 08/04/2014   Polysubstance abuse ([place]) 07/04/2014   Psychoactive substance abuse ([place]) 07/04/2014   Anxiety, generalized 11/24/2013   COPD with asthma (Nicole Austin) 11/18/2013   Arthritis of knee, degenerative 07/15/2013   Osteoarthritis of knee, unspecified 07/15/2013   Family History  Problem Relation Age of Onset   Hernia Mother    Heart disease Mother    OCD Mother    Diabetes Mother    Parkinson's disease Father    Bipolar disorder Sister    Schizophrenia Sister    ADD / ADHD Sister    Alcohol abuse Brother    Bipolar disorder Sister    Paranoid behavior Sister    ADD / ADHD Sister    ADD / ADHD Son    Dementia Maternal Grandmother    Emphysema Maternal Grandfather    ADD / ADHD Son    ADD / ADHD Son    Depression Son    Social History   Tobacco Use   Smoking status: Every Day    Packs/day: 0.50    Years: 36.00    Total pack years: 18.00    Types: Cigarettes    Passive exposure: Past   Smokeless tobacco: Never   Tobacco comments:    Pt using nicotine patches; 1/2 PPD as of 03/21/21  Substance Use Topics   Alcohol use: No    Alcohol/week: 0.0 standard drinks  of alcohol   Allergies  Allergen Reactions   Chantix [Varenicline] Nausea Only   Current Meds  Medication Sig   albuterol (VENTOLIN HFA) 108 (90 Base) MCG/ACT inhaler Inhale 2 puffs into the lungs every 4 (four) hours as needed for wheezing or shortness of breath.   ALPRAZolam (XANAX) 0.5 MG tablet Take 0.5 mg by mouth 4 (four) times daily.   atorvastatin (LIPITOR) 40 MG tablet TAKE 1 TABLET BY MOUTH EVERY NIGHT AT BEDTIME   Chlorhexidine Gluconate 2 % LIQD Apply 15 mLs topically daily for 14 days.   clotrimazole (GYNE-LOTRIMIN) 1 % vaginal cream Place 1 Applicatorful vaginally at bedtime for  7 days.   divalproex (DEPAKOTE) 250 MG DR tablet Take 1 tablet (250 mg total) by mouth 2 (two) times daily.   famotidine (PEPCID) 20 MG tablet TAKE 1 TABLET BY MOUTH 2 TIMES DAILY AS NEEDED FOR NAUSEA/INDIGESTION/STOMACH UPSET.   ipratropium-albuterol (DUONEB) 0.5-2.5 (3) MG/3ML SOLN Take 3 mLs by nebulization 3 (three) times daily as needed.   linezolid (ZYVOX) 600 MG tablet Take 1 tablet (600 mg total) by mouth 2 (two) times daily.   lurasidone (LATUDA) 80 MG TABS tablet Take 1 tablet (80 mg total) by mouth daily with breakfast.   meloxicam (MOBIC) 15 MG tablet Take 15 mg by mouth daily.   mometasone-formoterol (DULERA) 100-5 MCG/ACT AERO Inhale 2 puffs into the lungs in the morning and at bedtime.   Multiple Vitamin (MULTIVITAMIN WITH MINERALS) TABS tablet Take 1 tablet by mouth daily.   mupirocin ointment (BACTROBAN) 2 % Apply 1 Application topically 2 (two) times daily. nares   nystatin (MYCOSTATIN/NYSTOP) powder Apply 1 application. topically 3 (three) times daily as needed (rash in skin folds).   nystatin ointment (MYCOSTATIN) Apply 1 application topically 2 (two) times daily.   nystatin-triamcinolone ointment (MYCOLOG) Apply 1 Application topically 2 (two) times daily.   oxyCODONE-acetaminophen (PERCOCET) 5-325 MG tablet Take 1 tablet by mouth every 4 (four) hours as needed for up to 3 days for  severe pain.   promethazine (PHENERGAN) 25 MG tablet Take 25 mg by mouth 2 (two) times daily as needed.   Tiotropium Bromide Monohydrate (SPIRIVA RESPIMAT) 2.5 MCG/ACT AERS INHHALE TWO PUFFS INTO LUNGS DAILY   tiZANidine (ZANAFLEX) 4 MG tablet Take 1 tablet (4 mg total) by mouth 3 (three) times daily as needed for muscle spasms.   [DISCONTINUED] doxycycline (VIBRA-TABS) 100 MG tablet Take 1 tablet (100 mg total) by mouth 2 (two) times daily.   Immunization History  Administered Date(s) Administered   Influenza Split 12/15/2012   Influenza,inj,Quad PF,6+ Mos 10/27/2013, 12/22/2014, 12/13/2015, 10/16/2016, 05/02/2018, 12/20/2020, 11/13/2021   Influenza-Unspecified 11/07/2011, 12/15/2012   Moderna Sars-Covid-2 Vaccination 06/03/2019, 07/15/2019, 03/08/2020   Pneumococcal Polysaccharide-23 06/21/2015   Tdap 12/13/2015   Zoster Recombinat (Shingrix) 07/25/2018, 10/08/2018       Objective:   Physical Exam BP 132/78 (BP Location: Left Wrist, Cuff Size: Normal)   Pulse 89   Temp 97.8 F (36.6 C) (Temporal)   Ht 5\' 1"  (1.549 m)   Wt 233 lb 12.8 oz (106.1 kg)   LMP  (LMP Unknown)   SpO2 93%   BMI 44.18 kg/m   SpO2: 93 % O2 Device: None (Room air)  GENERAL: Obese woman, no acute distress, fully ambulatory.  No conversational dyspnea. HEAD: Normocephalic, atraumatic.  EYES: Pupils equal, round, reactive to light.  No scleral icterus.  MOUTH: No thrush, oral mucosa moist. NECK: Supple. No thyromegaly. Trachea midline. No JVD.  No adenopathy. PULMONARY: Good air entry bilaterally.  Scattered rhonchi and wheezes throughout.Marland Kitchen CARDIOVASCULAR: S1 and S2. Regular rate and rhythm.  ABDOMEN: Obese, otherwise benign. MUSCULOSKELETAL: No joint deformity, no clubbing, no edema.  NEUROLOGIC: No overt focal deficit, no gait disturbance, speech is fluent. SKIN: Intact,warm,dry.  Resolving abscess left axilla. PSYCH: Flat affect.  Ambulatory oximetry was performed today: At baseline resting heart  rate was 68 and oxygen saturation was 92%.  250 feet ambulation she desaturated to 89% and subsequently at 500 feet to 87%.  Patient was then placed on 2 L/min nasal cannula to which maintain her oxygen saturations between 90 to 93% with ambulation.  Maximum heart rate during  exercise was 99 bpm.  Patient meets criteria for supplemental oxygen during ambulation.      Assessment & Plan:     ICD-10-CM   1. COPD suggested by initial evaluation [facility])  J44.9 Pulmonary Function Test [facility] Only   Order PFTs Trial of Breztri 2 puffs twice a day Continue as needed albuterol    2. Chronic respiratory failure with hypoxia (HCC)  J96.11 Pulmonary Function Test [facility] Only   Meets criteria for oxygen supplementation 2 L/min She has oxygen at home, instructed to use 2 L/min at all times    3. Morbid obesity with BMI of 40.0-44.9, adult [facility])  E66.01    Z68.41    This issue adds complexity to her management May lead to alveolar hypoventilation     Meds ordered this encounter  Medications   Budeson-Glycopyrrol-Formoterol (BREZTRI AEROSPHERE) 160-9-4.8 MCG/ACT AERO    Sig: Inhale 2 puffs into the lungs in the morning and at bedtime.    Dispense:  10.7 g    Refill:  11   Budeson-Glycopyrrol-Formoterol (BREZTRI AEROSPHERE) 160-9-4.8 MCG/ACT AERO    Sig: Inhale 2 puffs into the lungs in the morning and at bedtime.    Dispense:  10.7 g    Refill:  0    Order Specific Question:   Lot Number?    Answer:   [id number]    Order Specific Question:   Expiration Date?    Answer:   02/27/2024    Order Specific Question:   Manufacturer?    Answer:   [employer] [[id number]]    Order Specific Question:   Quantity    Answer:   2   Qualifies for oxygen at 2 L/min 24/7.  The patient already has oxygen source at home through [place].  Will see the patient in follow-up in 3 months time she is to call sooner should any new problems arise.  Renold Don, MD Advanced Bronchoscopy P[facility] [place]  [facility]    *This note was dictated using voice recognition software/Dragon.  Despite best efforts to proofread, errors can occur which can change the meaning. Any transcriptional errors that result from this process are unintentional and may not be fully corrected at the time of dictation.

## 2021-11-21 ENCOUNTER — Ambulatory Visit: Payer: Self-pay

## 2021-11-21 ENCOUNTER — Other Ambulatory Visit: Payer: Self-pay

## 2021-11-21 ENCOUNTER — Encounter: Payer: Self-pay | Admitting: Emergency Medicine

## 2021-11-21 ENCOUNTER — Emergency Department
Admission: EM | Admit: 2021-11-21 | Discharge: 2021-11-21 | Disposition: A | Discharge status: Home / Self Care | Payer: Medicare Other | Attending: Emergency Medicine | Admitting: Emergency Medicine

## 2021-11-21 DIAGNOSIS — N189 Chronic kidney disease, unspecified: Secondary | ICD-10-CM | POA: Insufficient documentation

## 2021-11-21 DIAGNOSIS — K1379 Other lesions of oral mucosa: Secondary | ICD-10-CM | POA: Insufficient documentation

## 2021-11-21 DIAGNOSIS — K0889 Other specified disorders of teeth and supporting structures: Secondary | ICD-10-CM | POA: Diagnosis present

## 2021-11-21 DIAGNOSIS — J449 Chronic obstructive pulmonary disease, unspecified: Secondary | ICD-10-CM | POA: Insufficient documentation

## 2021-11-21 LAB — COMPREHENSIVE METABOLIC PANEL
ALT: 14 U/L (ref 0–44)
AST: 17 U/L (ref 15–41)
Albumin: 3.5 g/dL (ref 3.5–5.0)
Alkaline Phosphatase: 77 U/L (ref 38–126)
Anion gap: 10 (ref 5–15)
BUN: 20 mg/dL (ref 8–23)
CO2: 25 mmol/L (ref 22–32)
Calcium: 9.1 mg/dL (ref 8.9–10.3)
Chloride: 99 mmol/L (ref 98–111)
Creatinine, Ser: 1.22 mg/dL — ABNORMAL HIGH (ref 0.44–1.00)
GFR, Estimated: 50 mL/min — ABNORMAL LOW (ref >60)
Glucose, Bld: 87 mg/dL (ref 70–99)
Potassium: 4.4 mmol/L (ref 3.5–5.1)
Sodium: 134 mmol/L — ABNORMAL LOW (ref 135–145)
Total Bilirubin: 0.6 mg/dL (ref 0.3–1.2)
Total Protein: 6.9 g/dL (ref 6.5–8.1)

## 2021-11-21 LAB — CBC WITH DIFFERENTIAL/PLATELET
Abs Immature Granulocytes: 0.01 10*3/uL (ref 0.00–0.07)
Basophils Absolute: 0.1 10*3/uL (ref 0.0–0.1)
Basophils Relative: 1 %
Eosinophils Absolute: 0.4 10*3/uL (ref 0.0–0.5)
Eosinophils Relative: 4 %
HCT: 39.2 % (ref 36.0–46.0)
Hemoglobin: 12.8 g/dL (ref 12.0–15.0)
Immature Granulocytes: 0 %
Lymphocytes Relative: 26 %
Lymphs Abs: 2 10*3/uL (ref 0.7–4.0)
MCH: 29 pg (ref 26.0–34.0)
MCHC: 32.7 g/dL (ref 30.0–36.0)
MCV: 88.9 fL (ref 80.0–100.0)
Monocytes Absolute: 0.6 10*3/uL (ref 0.1–1.0)
Monocytes Relative: 7 %
Neutro Abs: 4.9 10*3/uL (ref 1.7–7.7)
Neutrophils Relative %: 62 %
Platelets: 205 10*3/uL (ref 150–400)
RBC: 4.41 MIL/uL (ref 3.87–5.11)
RDW: 13.7 % (ref 11.5–15.5)
WBC: 7.9 10*3/uL (ref 4.0–10.5)
nRBC: 0 % (ref 0.0–0.2)

## 2021-11-21 MED ORDER — LIDOCAINE VISCOUS HCL 2 % MT SOLN
15.0000 mL | Freq: Once | OROMUCOSAL | Status: DC
  Filled 2021-11-21: qty 15

## 2021-11-21 MED ORDER — MOMETASONE FURO-FORMOTEROL FUM 100-5 MCG/ACT IN AERO: 2.0000 | INHALATION_SPRAY | Freq: Two times a day (BID) | RESPIRATORY_TRACT | 0 refills | Status: DC

## 2021-11-21 MED ORDER — MAGIC MOUTHWASH W/LIDOCAINE: 5.0000 mL | Freq: Four times a day (QID) | ORAL | 0 refills | Status: DC | PRN

## 2021-11-21 MED ORDER — ALUM & MAG HYDROXIDE-SIMETH 200-200-20 MG/5ML PO SUSP
30.0000 mL | Freq: Once | ORAL | Status: DC
  Filled 2021-11-21: qty 30

## 2021-11-21 MED ORDER — SPIRIVA RESPIMAT 2.5 MCG/ACT IN AERS: 2.0000 | INHALATION_SPRAY | Freq: Every day | RESPIRATORY_TRACT | 0 refills | Status: DC

## 2021-11-21 MED ORDER — MAGIC MOUTHWASH W/LIDOCAINE
5.0000 mL | Freq: Once | ORAL | Status: AC
  Administered 2021-11-21: 5 mL via ORAL
  Filled 2021-11-21: qty 5

## 2021-11-21 NOTE — ED Triage Notes (Signed)
Pt here with mouth pain after taking a new inhaler. Pt states she cannot eat or drink and feels like she has blisters in her mouth. Pt does have discoloration to both sides of her mouth seen by this RN. Pt states she took her xanax due to her being so anxious.

## 2021-11-21 NOTE — Discharge Instructions (Signed)
Contact a health care provider if: You have mouth pain or throat pain. Your symptoms get worse. You have new symptoms. Your pain is not controlled with medicine. You have trouble speaking. Get help right away if you: Have a fever. Cannot swallow solid food or liquids. Have a lot of bleeding in your mouth. Develop new, open, or draining sores in your mouth.

## 2021-11-21 NOTE — ED Notes (Signed)
First Nurse Note:  C/O oral pain and redness since changing inhaler last week.  Tongue beefy red and redness noted to just outside mouth.

## 2021-11-21 NOTE — Telephone Encounter (Signed)
  Chief Complaint: moth and lips ulcers Symptoms: drooling, severe pain, hurts to eat, drink swallow, diarrhea Frequency: 11/17/21 Pertinent Negatives: Patient denies difficulty breathing Disposition: '[x]'$ ED /'[]'$ Urgent Care (no appt availability in office) / '[]'$ Appointment(In office/virtual)/ '[]'$  [facility]/ '[]'$ Home Care/ '[]'$ Refused Recommended Disposition /'[]'$ [place] [facility]/ '[]'$  Follow-up with PCP Additional Notes: severe pain/drooling Reason for Disposition  [1] Drooling or spitting out saliva (because can't swallow) AND [2] new-onset  Answer Assessment - Initial Assessment Questions 1. ONSET: "When did the mouth start hurting?" (e.g., hours or days ago)      Took inhaler brestri day after  2. SEVERITY: "How bad is the pain?" (Scale 1-10; mild, moderate or severe)   - MILD (1-3):  doesn't interfere with eating or normal activities   - MODERATE (4-7): interferes with eating some solids and normal activities   - SEVERE (8-10):  excruciating pain, interferes with most normal activities   - SEVERE DYSPHAGIA: can't swallow liquids, drooling     severe 3. SORES: "Are there any sores or ulcers in the mouth?" If Yes, ask: "What part of the mouth are the sores in?"     Yes- blisters 4. FEVER: "Do you have a fever?" If Yes, ask: "What is your temperature, how was it measured, and when did it start?"     no 5. CAUSE: "What do you think is causing the mouth pain?"     inhaler 6. OTHER SYMPTOMS: "Do you have any other symptoms?" (e.g., difficulty breathing)     Headache,diarrhea, anxiety(meds dont help  Protocols used: Mouth Pain-A-AH

## 2021-11-21 NOTE — ED Provider Notes (Signed)
[facility] Provider Note    Event Date/Time   First MD Initiated Contact with Patient 11/21/21 1504     (approximate)   History   Oral Pain   HPI  Nicole Austin is a 61 y.o. female history of COPD, follows with pulmonology  Patient reports that on Thursday she saw pulmonologist and had her inhaler switched to a new product called "Breztri".  After using it she reports that she is her mouth started to feel irritated and for the next several days she has had mouth pain and soreness.  She reported slightly painful swallows well.  She still able to eat and drink but mostly liquids  No difficulty breathing.  No fevers or chills.  Otherwise no complaints but reports her mouth feels very sore   Able to swallow just hurts mouth feels sore mostly around her inside of her cheeks.  Physical Exam   Triage Vital Signs: ED Triage Vitals [11/21/21 1247]  Enc Vitals Group     BP (!) 169/77     Pulse Rate 78     Resp 18     Temp 98.5 F (36.9 C)     Temp Source Oral     SpO2 92 %     Weight 233 lb 14.5 oz (106.1 kg)     Height '5\' 1"'$  (1.549 m)     Head Circumference      Peak Flow      Pain Score 10     Pain Loc      Pain Edu?      Excl. in [place]?     Most recent vital signs: Vitals:   11/21/21 1247 11/21/21 1550  BP: (!) 169/77 (!) 150/70  Pulse: 78 80  Resp: 18 18  Temp: 98.5 F (36.9 C)   SpO2: 92% 94%     General: Awake, no distress.  Very pleasant, resting comfortably.  Oral examination demonstrates small areas of slight purpura with edges of minimal petechiae along t the inner buccal mucosa and across the posterior soft palate.  Patient able to remove her dentures without difficulty, and of note the rash does not extend to the areas covered by her dentures but is seems to involve all areas that were not covered by the dentures.  There is no lingular edema.  Posterior oropharynx is without evidence of acute edema.  She speaks without a  hoarse voice.  There is no stridor.  She is able demonstrate no difficulty with swallowing and is handling secretions normally CV:  Good peripheral perfusion.  Resp:  Normal effort. Abd:  No distention.  Other:    Denies any trouble breathing.  Using home oxygen.  No cough.  No fevers.  No rash or itching area elsewhere on the body  ED Results / Procedures / Treatments   Labs (all labs ordered are listed, but only abnormal results are displayed) Labs Reviewed  COMPREHENSIVE METABOLIC PANEL - Abnormal; Notable for the following components:      Result Value   Sodium 134 (*)    Creatinine, Ser 1.22 (*)    GFR, Estimated 50 (*)    All other components within normal limits  CBC WITH DIFFERENTIAL/PLATELET     EKG     RADIOLOGY     PROCEDURES:  Critical Care performed: No  Procedures   MEDICATIONS ORDERED IN ED: Medications  magic mouthwash w/lidocaine (has no administration in time range)     IMPRESSION / MDM / ASSESSMENT  AND PLAN / ED COURSE  I reviewed the triage vital signs and the nursing notes.                              Differential diagnosis includes, but is not limited to, chemical irritant, mucositis, medication reaction, infection though at this point there is no indication or evidence to support obvious infectious etiology, etc.  Seems to be limited to the oral mucosa.  It seems to spare the area that she had her dentures in and she reports she had her dentures and when she first utilized a new inhaler and immediately started noticing symptoms thereafter.  Clinically no evidence of acute airway compromise.  After receiving a dose of lidocaine swish and swallow she feels much better took the pain away and she is comfortable to plan to follow-up with Dr. Patsey Berthold.  I discussed with Dr. Patsey Berthold and we have placed the patient back on her previous inhalers and she will discontinue use of the new inhaler and I have listed glycopyrrolate as a allergy on her chart  which seems the most likely cause  Patient's presentation is most consistent with acute complicated illness / injury requiring diagnostic workup.  The patient is on the cardiac monitor to evaluate for evidence of arrhythmia and/or significant heart rate changes.  Labs interpreted as normal CBC and metabolic panel with exception to very mild chronic renal disease  ----------------------------------------- 4:22 PM on 11/21/2021 ----------------------------------------- Patient resting comfortably.  Mouth pain much improved.  Return precautions and treatment recommendations and follow-up discussed with the patient who is agreeable with the plan.      FINAL CLINICAL IMPRESSION(S) / ED DIAGNOSES   Final diagnoses:  Mucosal irritation of oral cavity     Rx / DC Orders   ED Discharge Orders          Ordered    mometasone-formoterol (DULERA) 100-5 MCG/ACT AERO  2 times daily        11/21/21 1541    Tiotropium Bromide Monohydrate (SPIRIVA RESPIMAT) 2.5 MCG/ACT AERS  Daily        11/21/21 1541    magic mouthwash w/lidocaine SOLN  4 times daily PRN       Note to Pharmacy: Please mix viscous lidocaine 2% with magic mouthwash solution so that the lidocaine comprises approximately 25 % of the total solution.   11/21/21 1545             Note:  This document was prepared using Dragon voice recognition software and may include unintentional dictation errors.   Delman Kitten, MD 11/21/21 1622

## 2021-11-22 ENCOUNTER — Other Ambulatory Visit: Payer: Self-pay

## 2021-11-22 ENCOUNTER — Telehealth: Payer: Self-pay

## 2021-11-22 NOTE — Telephone Encounter (Signed)
Tyler Pita, MD  Claudette Head A, CMA She was seen in the ED for apparent reaction to [facility].  Call to check make sure she is doing okay.  She is back on [place] and Spiriva.  She did not make follow-up appointment with me and will need that.   Called and spoke to patient. Patient stated that sx are baseline since being discharged. She is on the way to the pharmacy now to pick up magic mouthwash.  Appt scheduled 02/21/2022 at 11:00. I have made Dr. Patsey Berthold aware of this verbally.  Nothing further needed.

## 2021-12-07 ENCOUNTER — Ambulatory Visit (INDEPENDENT_AMBULATORY_CARE_PROVIDER_SITE_OTHER): Payer: Medicare Other | Admitting: Family Medicine

## 2021-12-07 ENCOUNTER — Encounter: Payer: Self-pay | Admitting: Family Medicine

## 2021-12-07 VITALS — BP 134/74 | HR 88 | Temp 98.1°F | Resp 16 | Ht 61.0 in | Wt 233.7 lb

## 2021-12-07 DIAGNOSIS — J4489 Other specified chronic obstructive pulmonary disease: Secondary | ICD-10-CM | POA: Diagnosis not present

## 2021-12-07 DIAGNOSIS — K219 Gastro-esophageal reflux disease without esophagitis: Secondary | ICD-10-CM

## 2021-12-07 DIAGNOSIS — Z1231 Encounter for screening mammogram for malignant neoplasm of breast: Secondary | ICD-10-CM

## 2021-12-07 DIAGNOSIS — B888 Other specified infestations: Secondary | ICD-10-CM

## 2021-12-07 DIAGNOSIS — R197 Diarrhea, unspecified: Secondary | ICD-10-CM

## 2021-12-07 DIAGNOSIS — Z1211 Encounter for screening for malignant neoplasm of colon: Secondary | ICD-10-CM

## 2021-12-07 DIAGNOSIS — L089 Local infection of the skin and subcutaneous tissue, unspecified: Secondary | ICD-10-CM

## 2021-12-07 DIAGNOSIS — J441 Chronic obstructive pulmonary disease with (acute) exacerbation: Secondary | ICD-10-CM

## 2021-12-07 MED ORDER — DICYCLOMINE HCL 20 MG PO TABS: 20.0000 mg | ORAL_TABLET | Freq: Three times a day (TID) | ORAL | 1 refills | Status: DC | PRN

## 2021-12-07 MED ORDER — PANTOPRAZOLE SODIUM 40 MG PO TBEC: 40.0000 mg | DELAYED_RELEASE_TABLET | Freq: Every day | ORAL | 0 refills | Status: DC

## 2021-12-07 NOTE — Progress Notes (Signed)
Patient ID: Nicole Austin, female    DOB: [birth year], 61 y.o.   MRN: [id number]  PCP: Delsa Grana, PA-C  Chief Complaint  Patient presents with   Follow-up   COPD   Gastroesophageal Reflux    Subjective:   Nicole Austin is a 61 y.o. female, presents to clinic with CC of the following:  HPI  Presents for routine care but has bugs crawling on her in clinic with hx of bed bugs, recently having home treated    Patient Active Problem List   Diagnosis Date Noted   MRSA (methicillin resistant staph aureus) culture positive 05/04/2021   Hyperglycemia 01/01/2021   Prolonged QT interval 01/01/2021   Benign neoplasm of ascending colon    Polyp of sigmoid colon    Bipolar disorder with depression ([place]) 02/25/2017   ADD (attention deficit disorder) 02/25/2017   Marijuana use 02/13/2017   Neuropathy, peripheral 05/07/2016   Vitamin B12 deficiency 10/24/2015   Fibromyalgia 08/30/2015   Low HDL (under 40) 08/30/2015   GERD (gastroesophageal reflux disease) 08/30/2015   OP (osteoporosis) 06/30/2015   Sleep apnea 06/30/2015   Morbid (severe) obesity due to excess calories ([place]) 08/16/2014   Low back pain with sciatica 08/04/2014   Current tobacco use 08/04/2014   Polysubstance abuse ([place]) 07/04/2014   Psychoactive substance abuse ([place]) 07/04/2014   Anxiety, generalized 11/24/2013   COPD with asthma 11/18/2013   Arthritis of knee, degenerative 07/15/2013   Osteoarthritis of knee, unspecified 07/15/2013      Current Outpatient Medications:    albuterol (VENTOLIN HFA) 108 (90 Base) MCG/ACT inhaler, Inhale 2 puffs into the lungs every 4 (four) hours as needed for wheezing or shortness of breath., Disp: 18 g, Rfl: 2   ALPRAZolam (XANAX) 0.5 MG tablet, Take 0.5 mg by mouth 4 (four) times daily., Disp: , Rfl:    atorvastatin (LIPITOR) 40 MG tablet, TAKE 1 TABLET BY MOUTH EVERY NIGHT AT BEDTIME, Disp: 90 tablet, Rfl: 3   divalproex (DEPAKOTE) 250 MG DR tablet, Take 1  tablet (250 mg total) by mouth 2 (two) times daily., Disp: 60 tablet, Rfl: 2   famotidine (PEPCID) 20 MG tablet, TAKE 1 TABLET BY MOUTH 2 TIMES DAILY AS NEEDED FOR NAUSEA/INDIGESTION/STOMACH UPSET., Disp: 60 tablet, Rfl: 1   ipratropium-albuterol (DUONEB) 0.5-2.5 (3) MG/3ML SOLN, Take 3 mLs by nebulization 3 (three) times daily as needed., Disp: 180 mL, Rfl: 1   linezolid (ZYVOX) 600 MG tablet, Take 1 tablet (600 mg total) by mouth 2 (two) times daily., Disp: 14 tablet, Rfl: 0   lurasidone (LATUDA) 80 MG TABS tablet, Take 1 tablet (80 mg total) by mouth daily with breakfast., Disp: 90 tablet, Rfl: 0   magic mouthwash w/lidocaine SOLN, Take 5 mLs by mouth 4 (four) times daily as needed for mouth pain. Swish and spit, do not swallow the solution., Disp: 360 mL, Rfl: 0   meloxicam (MOBIC) 15 MG tablet, Take 15 mg by mouth daily., Disp: , Rfl:    mometasone-formoterol (DULERA) 100-5 MCG/ACT AERO, Inhale 2 puffs into the lungs in the morning and at bedtime., Disp: 1 each, Rfl: 0   Multiple Vitamin (MULTIVITAMIN WITH MINERALS) TABS tablet, Take 1 tablet by mouth daily., Disp: , Rfl:    mupirocin ointment (BACTROBAN) 2 %, Apply 1 Application topically 2 (two) times daily. nares, Disp: 22 g, Rfl: 0   nystatin (MYCOSTATIN/NYSTOP) powder, Apply 1 application. topically 3 (three) times daily as needed (rash in skin folds)., Disp: 60 g, Rfl: 1  nystatin ointment (MYCOSTATIN), Apply 1 application topically 2 (two) times daily., Disp: 30 g, Rfl: 0   nystatin-triamcinolone ointment (MYCOLOG), Apply 1 Application topically 2 (two) times daily., Disp: 30 g, Rfl: 0   promethazine (PHENERGAN) 25 MG tablet, Take 25 mg by mouth 2 (two) times daily as needed., Disp: , Rfl:    Tiotropium Bromide Monohydrate (SPIRIVA RESPIMAT) 2.5 MCG/ACT AERS, Inhale 2 puffs into the lungs daily., Disp: 1 each, Rfl: 0   tiZANidine (ZANAFLEX) 4 MG tablet, Take 1 tablet (4 mg total) by mouth 3 (three) times daily as needed for muscle  spasms., Disp: 30 tablet, Rfl: 0   escitalopram (LEXAPRO) 20 MG tablet, Take 1 tablet (20 mg total) by mouth daily., Disp: 30 tablet, Rfl: 2   Allergies  Allergen Reactions   Chantix [Varenicline] Nausea Only   Glycopyrrolate Rash    Oral irritation     Social History   Tobacco Use   Smoking status: Every Day    Packs/day: 0.50    Years: 36.00    Total pack years: 18.00    Types: Cigarettes    Passive exposure: Past   Smokeless tobacco: Never   Tobacco comments:    Pt using nicotine patches; 1/2 PPD as of 03/21/21  Vaping Use   Vaping Use: Former  Substance Use Topics   Alcohol use: No    Alcohol/week: 0.0 standard drinks of alcohol   Drug use: No      Chart Review Today: I personally reviewed active problem list, medication list, allergies, family history, social history, health maintenance, notes from last encounter, lab results, imaging with the patient/caregiver today.   Review of Systems  Constitutional: Negative.   HENT: Negative.    Eyes: Negative.   Respiratory: Negative.    Cardiovascular: Negative.   Gastrointestinal: Negative.   Endocrine: Negative.   Genitourinary: Negative.   Musculoskeletal: Negative.   Skin: Negative.   Allergic/Immunologic: Negative.   Neurological: Negative.   Hematological: Negative.   Psychiatric/Behavioral: Negative.    All other systems reviewed and are negative.      Objective:   Vitals:   12/07/21 1354  BP: 134/74  Pulse: 88  Resp: 16  Temp: 98.1 F (36.7 C)  TempSrc: Oral  SpO2: 94%  Weight: 233 lb 11.2 oz (106 kg)  Height: '5\' 1"'$  (1.549 m)    Body mass index is 44.16 kg/m.  Physical Exam Vitals and nursing note reviewed.  Constitutional:      General: She is not in acute distress.    Appearance: She is well-developed. She is obese. She is not toxic-appearing or diaphoretic.  HENT:     Head: Normocephalic and atraumatic.     Nose: Nose normal.  Eyes:     General:        Right eye: No discharge.         Left eye: No discharge.     Conjunctiva/sclera: Conjunctivae normal.  Neck:     Trachea: No tracheal deviation.  Cardiovascular:     Rate and Rhythm: Normal rate and regular rhythm.  Pulmonary:     Effort: Pulmonary effort is normal. No respiratory distress.     Breath sounds: No stridor.  Musculoskeletal:        General: Normal range of motion.  Skin:    General: Skin is warm and dry.     Findings: No rash.     Comments: Small red-brown bugs crawling on her sweater and back  Neurological:     Mental  Status: She is alert.     Motor: No abnormal muscle tone.     Coordination: Coordination normal.  Psychiatric:        Behavior: Behavior normal.      Results for orders placed or performed during the hospital encounter of 11/21/21  CBC with Differential  Result Value Ref Range   WBC 7.9 4.0 - 10.5 K/uL   RBC 4.41 3.87 - 5.11 MIL/uL   Hemoglobin 12.8 12.0 - 15.0 g/dL   HCT 39.2 36.0 - 46.0 %   MCV 88.9 80.0 - 100.0 fL   MCH 29.0 26.0 - 34.0 pg   MCHC 32.7 30.0 - 36.0 g/dL   RDW 13.7 11.5 - 15.5 %   Platelets 205 150 - 400 K/uL   nRBC 0.0 0.0 - 0.2 %   Neutrophils Relative % 62 %   Neutro Abs 4.9 1.7 - 7.7 K/uL   Lymphocytes Relative 26 %   Lymphs Abs 2.0 0.7 - 4.0 K/uL   Monocytes Relative 7 %   Monocytes Absolute 0.6 0.1 - 1.0 K/uL   Eosinophils Relative 4 %   Eosinophils Absolute 0.4 0.0 - 0.5 K/uL   Basophils Relative 1 %   Basophils Absolute 0.1 0.0 - 0.1 K/uL   Immature Granulocytes 0 %   Abs Immature Granulocytes 0.01 0.00 - 0.07 K/uL  Comprehensive metabolic panel  Result Value Ref Range   Sodium 134 (L) 135 - 145 mmol/L   Potassium 4.4 3.5 - 5.1 mmol/L   Chloride 99 98 - 111 mmol/L   CO2 25 22 - 32 mmol/L   Glucose, Bld 87 70 - 99 mg/dL   BUN 20 8 - 23 mg/dL   Creatinine, Ser 1.22 (H) 0.44 - 1.00 mg/dL   Calcium 9.1 8.9 - 10.3 mg/dL   Total Protein 6.9 6.5 - 8.1 g/dL   Albumin 3.5 3.5 - 5.0 g/dL   AST 17 15 - 41 U/L   ALT 14 0 - 44 U/L    Alkaline Phosphatase 77 38 - 126 U/L   Total Bilirubin 0.6 0.3 - 1.2 mg/dL   GFR, Estimated 50 (L) >60 mL/min   Anion gap 10 5 - 15       Assessment & Plan:     ICD-10-CM   1. Infestation by bed bug  B88.8    unable to do whole visit due to this, bugs in room, unable to do labs, reschedule appt    2. Recurrent infection of skin  L08.9    improved, seeing ID    3. COPD with asthma  J44.89    she reports sx are stable    4. Gastroesophageal reflux disease, unspecified whether esophagitis present  K21.9 pantoprazole (PROTONIX) 40 MG tablet   refill on PPI    5. Diarrhea, unspecified type  R19.7 dicyclomine (BENTYL) 20 MG tablet   recurrent GI sx, but also many recent abx, bentyl PRN, may need GI consult    6. Encounter for screening mammogram for malignant neoplasm of breast  Z12.31 MM 3D SCREEN BREAST BILATERAL    7. Screening for malignant neoplasm of colon  Z12.11 Ambulatory referral to Gastroenterology          Delsa Grana, PA-C 12/07/21 2:21 PM

## 2021-12-12 ENCOUNTER — Other Ambulatory Visit: Payer: Self-pay | Admitting: Family Medicine

## 2021-12-12 NOTE — Telephone Encounter (Signed)
Requested medications are due for refill today.  yes  Requested medications are on the active medications list.  yes  Last refill. 06/06/2021 #90 0 rf  Future visit scheduled.   yes  Notes to clinic.  Refill not delegated.    Requested Prescriptions  Pending Prescriptions Disp Refills   LATUDA 80 MG TABS tablet [Pharmacy Med Name: LATUDA 80 MG TAB] 90 tablet 0    Sig: TAKE 1 TABLET BY MOUTH DAILY WITH BREAKFAST     Not Delegated - Psychiatry:  Antipsychotics - Second Generation (Atypical) - lurasidone Failed - 12/12/2021  1:48 PM      Failed - This refill cannot be delegated      Failed - TSH in normal range and within 360 days    TSH  Date Value Ref Range Status  05/14/2019 1.000 0.350 - 4.500 uIU/mL Final    Comment:    Performed by a 3rd Generation assay with a functional sensitivity of <=0.01 uIU/mL. Performed at [facility], [address] [place], [place] [address]   05/02/2018 1.45 0.40 - 4.50 mIU/L Final         Failed - Lipid Panel in normal range within the last 12 months    Cholesterol  Date Value Ref Range Status  12/07/2019 117 <200 mg/dL Final  10/21/2013 169 0 - 200 mg/dL Final   Ldl Cholesterol, Calc  Date Value Ref Range Status  10/21/2013 98 0 - 100 mg/dL Final   LDL Cholesterol (Calc)  Date Value Ref Range Status  12/07/2019 58 mg/dL (calc) Final    Comment:    Reference range: <100 . Desirable range <100 mg/dL for primary prevention;   <70 mg/dL for patients with CHD or diabetic patients  with > or = 2 CHD risk factors. Marland Kitchen LDL-C is now calculated using the Martin-Hopkins  calculation, which is a validated novel method providing  better accuracy than the Friedewald equation in the  estimation of LDL-C.  Cresenciano Genre et al. Annamaria Helling. 7829;562(13): 2061-2068  (http://education.[employer].com/faq/FAQ164)    HDL Cholesterol  Date Value Ref Range Status  10/21/2013 35 (L) 40 - 60 mg/dL Final   HDL  Date Value Ref Range Status   12/07/2019 31 (L) > OR = 50 mg/dL Final   Triglycerides  Date Value Ref Range Status  12/07/2019 216 (H) <150 mg/dL Final    Comment:    . If a non-fasting specimen was collected, consider repeat triglyceride testing on a fasting specimen if clinically indicated.  Yates Decamp et al. J. of Clin. Lipidol. 0865;7:846-962. .   10/21/2013 182 0 - 200 mg/dL Final         Passed - Last BP in normal range    BP Readings from Last 1 Encounters:  12/07/21 134/74         Passed - Last Heart Rate in normal range    Pulse Readings from Last 1 Encounters:  12/07/21 88         Passed - Valid encounter within last 6 months    Recent Outpatient Visits           5 days ago Infestation by bed bug   [facility] Delsa Grana, PA-C   4 weeks ago Abscess   [place], FNP   1 month ago Abscess   [facility] Delsa Grana, PA-C   3 months ago COPD with acute exacerbation [facility])   [facility] Delsa Grana, [place]  3 months ago MRSA infection   [facility] Steele Sizer, MD       Future Appointments             In 1 week Delsa Grana, PA-C [facility] [place]   In 2 months Tyler Pita, MD [facility] [place]   In 3 months  [place] within normal limits and completed in the last 12 months    WBC  Date Value Ref Range Status  11/21/2021 7.9 4.0 - 10.5 K/uL Final   RBC  Date Value Ref Range Status  11/21/2021 4.41 3.87 - 5.11 MIL/uL Final   Hemoglobin  Date Value Ref Range Status  11/21/2021 12.8 12.0 - 15.0 g/dL Final   HGB  Date Value Ref Range Status  06/15/2014 10.9 (L) 12.0 - 16.0 g/dL Final   HCT  Date Value Ref Range Status  11/21/2021 39.2 36.0 - 46.0 % Final  06/15/2014 32.9 (L) 35.0 - 47.0 % Final   MCHC  Date Value Ref Range Status  11/21/2021 32.7 30.0 - 36.0  g/dL Final   [facility]  Date Value Ref Range Status  11/21/2021 29.0 26.0 - 34.0 pg Final   MCV  Date Value Ref Range Status  11/21/2021 88.9 80.0 - 100.0 fL Final  06/15/2014 92 80 - 100 fL Final   No results found for: "PLTCOUNTKUC", "LABPLAT", "POCPLA" RDW  Date Value Ref Range Status  11/21/2021 13.7 11.5 - 15.5 % Final  06/15/2014 15.0 (H) 11.5 - 14.5 % Final         Passed - CMP within normal limits and completed in the last 12 months    Albumin  Date Value Ref Range Status  11/21/2021 3.5 3.5 - 5.0 g/dL Final  06/15/2014 2.6 (L) g/dL Final    Comment:    3.5-5.0 NOTE: New reference range  05/04/14    Alkaline Phosphatase  Date Value Ref Range Status  11/21/2021 77 38 - 126 U/L Final  06/15/2014 73 U/L Final    Comment:    38-126 NOTE: New Reference Range  05/04/14    Alkaline phosphatase (APISO)  Date Value Ref Range Status  09/01/2021 78 37 - 153 U/L Final   ALT  Date Value Ref Range Status  11/21/2021 14 0 - 44 U/L Final   SGPT (ALT)  Date Value Ref Range Status  06/15/2014 32 U/L Final    Comment:    14-54 NOTE: New Reference Range  05/04/14    AST  Date Value Ref Range Status  11/21/2021 17 15 - 41 U/L Final   SGOT(AST)  Date Value Ref Range Status  06/15/2014 40 U/L Final    Comment:    15-41 NOTE: New Reference Range  05/04/14    BUN  Date Value Ref Range Status  11/21/2021 20 8 - 23 mg/dL Final  06/15/2014 12 mg/dL Final    Comment:    6-20 NOTE: New Reference Range  05/04/14    Calcium  Date Value Ref Range Status  11/21/2021 9.1 8.9 - 10.3 mg/dL Final   Calcium, Total  Date Value Ref Range Status  06/15/2014 8.1 (L) mg/dL Final    Comment:    8.9-10.3 NOTE: New Reference Range  05/04/14    Calcium, Ion  Date Value Ref Range Status  11/18/2010 1.21 1.12 - 1.32 mmol/L Final   CO2  Date Value  Ref Range Status  11/21/2021 25 22 - 32 mmol/L Final   Co2  Date Value Ref Range Status  06/15/2014 24 mmol/L Final     Comment:    22-32 NOTE: New Reference Range  05/04/14    Bicarbonate  Date Value Ref Range Status  01/01/2021 26.9 20.0 - 28.0 mmol/L Final   TCO2  Date Value Ref Range Status  11/18/2010 24 0 - 100 mmol/L Final   Creat  Date Value Ref Range Status  09/01/2021 1.23 (H) 0.50 - 1.05 mg/dL Final   Creatinine, Ser  Date Value Ref Range Status  11/21/2021 1.22 (H) 0.44 - 1.00 mg/dL Final   Creatinine, Urine  Date Value Ref Range Status  10/04/2015 CANCELED 20 - 320 mg/dL     Comment:    Test not performed, no urine was received.    Result canceled by the ancillary   10/04/2015 128 20 - 320 mg/dL Final   Glucose  Date Value Ref Range Status  06/15/2014 92 mg/dL Final    Comment:    65-99 NOTE: New Reference Range  05/04/14    Glucose, Bld  Date Value Ref Range Status  11/21/2021 87 70 - 99 mg/dL Final    Comment:    Glucose reference range applies only to samples taken after fasting for at least 8 hours.   Glucose-Capillary  Date Value Ref Range Status  04/23/2020 105 (H) 70 - 99 mg/dL Final    Comment:    Glucose reference range applies only to samples taken after fasting for at least 8 hours.   Potassium  Date Value Ref Range Status  11/21/2021 4.4 3.5 - 5.1 mmol/L Final  06/16/2014 3.3 (L) mmol/L Final    Comment:    3.5-5.1 NOTE: New Reference Range  05/04/14    Sodium  Date Value Ref Range Status  11/21/2021 134 (L) 135 - 145 mmol/L Final  06/15/2014 136 mmol/L Final    Comment:    135-145 NOTE: New Reference Range  05/04/14    Total Bilirubin  Date Value Ref Range Status  11/21/2021 0.6 0.3 - 1.2 mg/dL Final   Bilirubin,Total  Date Value Ref Range Status  06/15/2014 0.7 mg/dL Final    Comment:    0.3-1.2 NOTE: New Reference Range  05/04/14    Bilirubin, Direct  Date Value Ref Range Status  05/14/2019 0.2 0.0 - 0.2 mg/dL Final  06/14/2014 0.2 mg/dL Final    Comment:    0.1-0.5 NOTE: New Reference Range  05/04/14     Indirect Bilirubin  Date Value Ref Range Status  05/14/2019 0.7 0.3 - 0.9 mg/dL Final    Comment:    Performed at [facility], [address] [place], [place] [address]   Protein, ur  Date Value Ref Range Status  05/14/2019 >=300 (A) NEGATIVE mg/dL Final   Total Protein  Date Value Ref Range Status  11/21/2021 6.9 6.5 - 8.1 g/dL Final  06/15/2014 5.5 (L) g/dL Final    Comment:    6.5-8.1 NOTE: New Reference Range  05/04/14    GFR, Est African American  Date Value Ref Range Status  12/07/2019 76 > OR = 60 mL/min/1.20m Final   eGFR  Date Value Ref Range Status  09/01/2021 50 (L) > OR = 60 mL/min/1.739mFinal    Comment:    The eGFR is based on the CKD-EPI 2021 equation. To calculate  the new eGFR from a previous Creatinine or Cystatin C result, go to https://www.kidney.org/professionals/ kdoqi/gfr%5Fcalculator  GFR, Est Non African American  Date Value Ref Range Status  12/07/2019 66 > OR = 60 mL/min/1.79m Final   GFR, Estimated  Date Value Ref Range Status  11/21/2021 50 (L) >60 mL/min Final    Comment:    (NOTE) Calculated using the CKD-EPI Creatinine Equation (2021)           c

## 2021-12-13 ENCOUNTER — Encounter: Payer: Self-pay | Admitting: *Deleted

## 2021-12-21 ENCOUNTER — Ambulatory Visit: Payer: Medicare Other | Admitting: Family Medicine

## 2022-01-05 ENCOUNTER — Emergency Department
Admission: EM | Admit: 2022-01-05 | Discharge: 2022-01-05 | Disposition: A | Discharge status: Home / Self Care | Payer: Medicare Other | Attending: Emergency Medicine | Admitting: Emergency Medicine

## 2022-01-05 ENCOUNTER — Other Ambulatory Visit: Payer: Self-pay

## 2022-01-05 ENCOUNTER — Emergency Department: Payer: Medicare Other

## 2022-01-05 ENCOUNTER — Ambulatory Visit: Payer: Self-pay | Admitting: *Deleted

## 2022-01-05 DIAGNOSIS — Z20822 Contact with and (suspected) exposure to covid-19: Secondary | ICD-10-CM | POA: Insufficient documentation

## 2022-01-05 DIAGNOSIS — J441 Chronic obstructive pulmonary disease with (acute) exacerbation: Secondary | ICD-10-CM | POA: Insufficient documentation

## 2022-01-05 DIAGNOSIS — R0602 Shortness of breath: Secondary | ICD-10-CM | POA: Diagnosis present

## 2022-01-05 LAB — CBC
HCT: 38.3 % (ref 36.0–46.0)
Hemoglobin: 12.1 g/dL (ref 12.0–15.0)
MCH: 28.8 pg (ref 26.0–34.0)
MCHC: 31.6 g/dL (ref 30.0–36.0)
MCV: 91.2 fL (ref 80.0–100.0)
Platelets: 208 10*3/uL (ref 150–400)
RBC: 4.2 MIL/uL (ref 3.87–5.11)
RDW: 15.4 % (ref 11.5–15.5)
WBC: 5.6 10*3/uL (ref 4.0–10.5)
nRBC: 0 % (ref 0.0–0.2)

## 2022-01-05 LAB — BASIC METABOLIC PANEL
Anion gap: 9 (ref 5–15)
BUN: 9 mg/dL (ref 8–23)
CO2: 28 mmol/L (ref 22–32)
Calcium: 8.8 mg/dL — ABNORMAL LOW (ref 8.9–10.3)
Chloride: 97 mmol/L — ABNORMAL LOW (ref 98–111)
Creatinine, Ser: 0.97 mg/dL (ref 0.44–1.00)
GFR, Estimated: >60 mL/min (ref >60)
Glucose, Bld: 103 mg/dL — ABNORMAL HIGH (ref 70–99)
Potassium: 4.2 mmol/L (ref 3.5–5.1)
Sodium: 134 mmol/L — ABNORMAL LOW (ref 135–145)

## 2022-01-05 LAB — BRAIN NATRIURETIC PEPTIDE: B Natriuretic Peptide: 55.4 pg/mL (ref 0.0–100.0)

## 2022-01-05 LAB — RESP PANEL BY RT-PCR (FLU A&B, COVID) ARPGX2
Influenza A by PCR: NEGATIVE
Influenza B by PCR: NEGATIVE
SARS Coronavirus 2 by RT PCR: NEGATIVE

## 2022-01-05 LAB — TROPONIN I (HIGH SENSITIVITY)
Troponin I (High Sensitivity): 5 ng/L (ref <18)
Troponin I (High Sensitivity): 4 ng/L (ref <18)

## 2022-01-05 MED ORDER — IPRATROPIUM-ALBUTEROL 0.5-2.5 (3) MG/3ML IN SOLN
3.0000 mL | Freq: Once | RESPIRATORY_TRACT | Status: AC
  Administered 2022-01-05: 3 mL via RESPIRATORY_TRACT

## 2022-01-05 MED ORDER — METHYLPREDNISOLONE SODIUM SUCC 125 MG IJ SOLR
125.0000 mg | Freq: Once | INTRAMUSCULAR | Status: AC
  Administered 2022-01-05: 125 mg via INTRAVENOUS
  Filled 2022-01-05: qty 2

## 2022-01-05 MED ORDER — IPRATROPIUM-ALBUTEROL 0.5-2.5 (3) MG/3ML IN SOLN
3.0000 mL | Freq: Once | RESPIRATORY_TRACT | Status: AC
  Filled 2022-01-05: qty 3

## 2022-01-05 MED ORDER — DEXAMETHASONE SODIUM PHOSPHATE 10 MG/ML IJ SOLN
6.0000 mg | Freq: Once | INTRAMUSCULAR | Status: AC
  Administered 2022-01-05: 6 mg via INTRAVENOUS
  Filled 2022-01-05: qty 1

## 2022-01-05 MED ORDER — ALBUTEROL SULFATE (2.5 MG/3ML) 0.083% IN NEBU: 2.5000 mg | INHALATION_SOLUTION | RESPIRATORY_TRACT | Status: DC | PRN

## 2022-01-05 MED ORDER — IPRATROPIUM-ALBUTEROL 0.5-2.5 (3) MG/3ML IN SOLN
RESPIRATORY_TRACT | Status: AC
  Administered 2022-01-05: 3 mL via RESPIRATORY_TRACT
  Filled 2022-01-05: qty 3

## 2022-01-05 MED ORDER — IPRATROPIUM-ALBUTEROL 0.5-2.5 (3) MG/3ML IN SOLN
3.0000 mL | Freq: Four times a day (QID) | RESPIRATORY_TRACT | Status: DC
  Administered 2022-01-05: 3 mL via RESPIRATORY_TRACT
  Filled 2022-01-05: qty 3

## 2022-01-05 MED ORDER — IPRATROPIUM-ALBUTEROL 0.5-2.5 (3) MG/3ML IN SOLN
3.0000 mL | Freq: Once | RESPIRATORY_TRACT | Status: AC
  Administered 2022-01-05: 3 mL via RESPIRATORY_TRACT
  Filled 2022-01-05: qty 3

## 2022-01-05 MED ORDER — AZITHROMYCIN 500 MG PO TABS
500.0000 mg | ORAL_TABLET | Freq: Once | ORAL | Status: AC
  Administered 2022-01-05: 500 mg via ORAL
  Filled 2022-01-05: qty 1

## 2022-01-05 NOTE — ED Provider Notes (Signed)
[facility] Provider Note    Event Date/Time   First MD Initiated Contact with Patient 01/05/22 1610     (approximate)   History   Shortness of Breath   HPI  Nicole Austin is a 61 y.o. female past medical history of [place] who reports at the end of last month she started getting some coughing.  This is gotten worse she has some chest tightness when she lays down.  In fact the last several days when she lays down at night she has to get up after a while and sit up in a chair because he gets so short of breath.  Cough is nonproductive but she feels like there is something rattling around in her chest that she just cannot get up.  She reports chills but no fever.  She thinks this is something like her COPD but perhaps not as bad.  She is on 2 L of home oxygen.      Physical Exam   Triage Vital Signs: ED Triage Vitals  Enc Vitals Group     BP 01/05/22 1557 136/78     Pulse Rate 01/05/22 1557 84     Resp 01/05/22 1557 (!) 30     Temp 01/05/22 1557 (!) 97.3 F (36.3 C)     Temp Source 01/05/22 1557 Oral     SpO2 01/05/22 1600 93 %     Weight --      Height --      Head Circumference --      Peak Flow --      Pain Score 01/05/22 1600 0     Pain Loc --      Pain Edu? --      Excl. in [place]? --     Most recent vital signs: Vitals:   01/05/22 1850 01/05/22 1921  BP:  119/73  Pulse: 81 95  Resp: (!) 24 (!) 22  Temp:  97.9 F (36.6 C)  SpO2: 97% 91%     General: Awake, no distress.  CV:  Good peripheral perfusion.  Heart regular rate and rhythm no audible murmurs.  Is very hard to hear because of the wheezing Resp:  Somewhat increased effort.  Diffuse wheezing. Abd:  No distention.  Soft Extremities: No edema  ----------------------------------------- 8:26 PM on 01/05/2022 ----------------------------------------- On repeat exam lungs are much much better almost clear. ED Results / Procedures / Treatments   Labs (all labs ordered are  listed, but only abnormal results are displayed) Labs Reviewed  BASIC METABOLIC PANEL - Abnormal; Notable for the following components:      Result Value   Sodium 134 (*)    Chloride 97 (*)    Glucose, Bld 103 (*)    Calcium 8.8 (*)    All other components within normal limits  CBC  BRAIN NATRIURETIC PEPTIDE  TROPONIN I (HIGH SENSITIVITY)  TROPONIN I (HIGH SENSITIVITY)     EKG  EKG read interpreted by me shows normal sinus rhythm rate of 79 normal axis no acute ST-T changes she is 457 ms she does not have prolonged QT at this time.   RADIOLOGY Chest x-ray read by radiology reviewed and interpreted by me does not seem to show any acute changes   PROCEDURES:  Critical Care performed:   Procedures   MEDICATIONS ORDERED IN ED: Medications  albuterol (PROVENTIL) (2.5 MG/3ML) 0.083% nebulizer solution 2.5 mg (has no administration in time range)  dexamethasone (DECADRON) injection 6 mg (has no  administration in time range)  azithromycin (ZITHROMAX) tablet 500 mg (has no administration in time range)  ipratropium-albuterol (DUONEB) 0.5-2.5 (3) MG/3ML nebulizer solution 3 mL (has no administration in time range)  ipratropium-albuterol (DUONEB) 0.5-2.5 (3) MG/3ML nebulizer solution 3 mL (3 mLs Nebulization Given 01/05/22 1712)  ipratropium-albuterol (DUONEB) 0.5-2.5 (3) MG/3ML nebulizer solution 3 mL (3 mLs Nebulization Given 01/05/22 1634)  methylPREDNISolone sodium succinate (SOLU-MEDROL) 125 mg/2 mL injection 125 mg (125 mg Intravenous Given 01/05/22 1712)  ipratropium-albuterol (DUONEB) 0.5-2.5 (3) MG/3ML nebulizer solution 3 mL (3 mLs Nebulization Given 01/05/22 1844)     IMPRESSION / MDM / ASSESSMENT AND PLAN / ED COURSE  I reviewed the triage vital signs and the nursing notes. We will give the patient some DuoNebs and some Solu-Medrol and see how she does.  She is afebrile and running 93% on 4 L.  Likely this is COPD  exacerbation.  ----------------------------------------- 8:24 PM on 01/05/2022 ----------------------------------------- Patient feels well.  She feels better than she does at home.  Her O2 sats walking do go down however again she says she feels better walking now that she does at home.  Her O2 sats laying in the bed are ranging between 90 and 92%.  She wants to go home.  She does not want to stay.  Since she says she feels better I will let her go.  We will give her some Zithromax and dexamethasone now patient.  Patient says she cannot afford any medicines.  Patient will return if she is worse.  I told her to call 911 if need be.  Patient's presentation is most consistent with acute presentation with potential threat to life or bodily function.  The patient is on the cardiac monitor to evaluate for evidence of arrhythmia and/or significant heart rate changes.  None were seen.   FINAL CLINICAL IMPRESSION(S) / ED DIAGNOSES   Final diagnoses:  COPD exacerbation ([place])     Rx / DC Orders   ED Discharge Orders     None        Note:  This document was prepared using Dragon voice recognition software and may include unintentional dictation errors.   Nena Polio, MD 01/05/22 2026

## 2022-01-05 NOTE — Telephone Encounter (Signed)
  Chief Complaint: difficulty breathing , possible dehydration Symptoms: shortness of breath at rest. Wears oxygen O 2 at 2L/min. Light headedness. Exhausted x 1 week unable to bath self. Incontinent of urine. Dry mouth. No tolerated food intake of liquids poor. Dry cough, causing chest discomfort. Unable to cough up phlegm. Sounds congested.  Frequency: worsening since week of 12/26/21 Pertinent Negatives: Patient denies na  Disposition: '[x]'$ ED /'[]'$ Urgent Care (no appt availability in office) / '[]'$ Appointment(In office/virtual)/ '[]'$  [place] [facility]/ '[]'$ Home Care/ '[]'$ Refused Recommended Disposition /'[]'$ [place] [facility]/ '[]'$  Follow-up with PCP Additional Notes:   Instructed patient to go to ED now. If she can not tolerate getting in car call 911 .      Reason for Disposition  [1] MODERATE difficulty breathing (e.g., speaks in phrases, SOB even at rest, pulse 100-120) AND [2] NEW-onset or WORSE than normal  Answer Assessment - Initial Assessment Questions 1. RESPIRATORY STATUS: "Describe your breathing?" (e.g., wheezing, shortness of breath, unable to speak, severe coughing)      Shortness of breath at rest cough  2. ONSET: "When did this breathing problem begin?"      Ongoing since week of 12/26/21 3. PATTERN "Does the difficult breathing come and go, or has it been constant since it started?"      Na  4. SEVERITY: "How bad is your breathing?" (e.g., mild, moderate, severe)    - MILD: No SOB at rest, mild SOB with walking, speaks normally in sentences, can lie down, no retractions, pulse < 100.    - MODERATE: SOB at rest, SOB with minimal exertion and prefers to sit, cannot lie down flat, speaks in phrases, mild retractions, audible wheezing, pulse 100-120.    - SEVERE: Very SOB at rest, speaks in single words, struggling to breathe, sitting hunched forward, retractions, pulse > 120      Moderate to severe. Wears O 2 at 2L/min  5. RECURRENT SYMPTOM: "Have you had difficulty breathing  before?" If Yes, ask: "When was the last time?" and "What happened that time?"      Yes  6. CARDIAC HISTORY: "Do you have any history of heart disease?" (e.g., heart attack, angina, bypass surgery, angioplasty)      See hx  7. LUNG HISTORY: "Do you have any history of lung disease?"  (e.g., pulmonary embolus, asthma, emphysema)     Yes wears oxygen  8. CAUSE: "What do you think is causing the breathing problem?"      Not sure  9. OTHER SYMPTOMS: "Do you have any other symptoms? (e.g., dizziness, runny nose, cough, chest pain, fever)     Lightheaded, dry mouth, exhausted, incontinent of urine. Cough dry unable to cough up sputum. 10. O2 SATURATION MONITOR:  "Do you use an oxygen saturation monitor (pulse oximeter) at home?" If Yes, ask: "What is your reading (oxygen level) today?" "What is your usual oxygen saturation reading?" (e.g., 95%)       Does not have one  11. PREGNANCY: "Is there any chance you are pregnant?" "When was your last menstrual period?"       na 12. TRAVEL: "Have you traveled out of the country in the last month?" (e.g., travel history, exposures)       na  Protocols used: Breathing Difficulty-A-AH

## 2022-01-05 NOTE — ED Notes (Addendum)
Ambulated patient in hall while wearing 2 lpm [place]. Lowest oxygen saturation reading:  85%. Pt stated she felt ok and didn't feel short of breath. Pt ambulated back to exam room and assisted on to stretcher. Tolerated well. Current O2 sat 91% on 2 lpm [place].

## 2022-01-05 NOTE — Discharge Instructions (Signed)
I have given you some antibiotics here that will last for a couple days in your system.  Have also given you a long-acting steroid IV.  Try the inhaler 1 puff 4 times a day along with your albuterol as needed.  Please return here if you are worse.  If she gets bad at 3:00 in the morning just call the ambulance.  Follow-up with your doctor within the week.

## 2022-01-05 NOTE — ED Notes (Signed)
Report to Melinda, RN

## 2022-01-05 NOTE — ED Notes (Signed)
Pt taken to xray at this time.

## 2022-01-05 NOTE — ED Notes (Signed)
Pt reports feeling better after 2x breathing treatments - pt reports feeling sputum loosen but hasn't coughed any up yet.  Monitors in place.

## 2022-01-05 NOTE — ED Triage Notes (Addendum)
Pt c/o SOB and cough since September that's been getting worse. Pt has history of COPD. Pt AOX4, NAD noted. Respirations even and unlabored, tachypnea noted. Expiratory wheeze noted to auscultation bilaterally. Pt denies dizziness, CP, N/V/D. Pt on 2 liter's [place] chronically. Pt states she tried nebulizer treatments and oxygen w/ out improvement. Pt 84% on RA.

## 2022-01-08 ENCOUNTER — Other Ambulatory Visit: Payer: Self-pay | Admitting: Family Medicine

## 2022-01-08 DIAGNOSIS — K219 Gastro-esophageal reflux disease without esophagitis: Secondary | ICD-10-CM

## 2022-01-08 NOTE — Telephone Encounter (Signed)
No answer from pt left vm to return call. I want to check up on her to see if she is doing better. I see pt went to hospital and was discharged with her feeling better.

## 2022-02-08 ENCOUNTER — Telehealth: Payer: Self-pay | Admitting: Family Medicine

## 2022-02-08 ENCOUNTER — Telehealth (INDEPENDENT_AMBULATORY_CARE_PROVIDER_SITE_OTHER): Payer: Medicare Other | Admitting: Family Medicine

## 2022-02-08 DIAGNOSIS — R269 Unspecified abnormalities of gait and mobility: Secondary | ICD-10-CM

## 2022-02-08 DIAGNOSIS — M25552 Pain in left hip: Secondary | ICD-10-CM | POA: Diagnosis not present

## 2022-02-08 MED ORDER — ACETAMINOPHEN 500 MG PO TABS: 1000.0000 mg | ORAL_TABLET | Freq: Three times a day (TID) | ORAL | 0 refills | Status: DC | PRN

## 2022-02-08 MED ORDER — NAPROXEN SODIUM 220 MG PO TABS: 220.0000 mg | ORAL_TABLET | Freq: Two times a day (BID) | ORAL | 0 refills | Status: DC | PRN

## 2022-02-08 MED ORDER — PANTOPRAZOLE SODIUM 40 MG PO TBEC: 40.0000 mg | DELAYED_RELEASE_TABLET | Freq: Two times a day (BID) | ORAL | 0 refills | Status: DC

## 2022-02-08 NOTE — Progress Notes (Signed)
Name: Nicole Austin   MRN: [id number]    DOB: 05-16-1960   Date:02/08/2022       Progress Note  Subjective:    Chief Complaint  Chief Complaint  Patient presents with   Groin Pain    Left side onset for a month, unable to walk    I connected with  Gennie Alma Gren on 02/08/22 at 10:20 AM EST by telephone and verified that I am speaking with the correct person using two identifiers.   I discussed the limitations, risks, security and privacy concerns of performing an evaluation and management service by telephone and the availability of in person appointments. Staff also discussed with the patient that there may be a patient responsible charge related to this service.  Patient verbalized understanding and agreed to proceed with encounter. Patient Location: home Provider Location: [facility] Additional Individuals present: none  Groin Pain    Left groin pain feels pulled, shooting to left groin and some to buttock, hurts with any movement and with weight bearing  Onset gradually 3 weeks ago, she's taken tylenol and her muscle relaxer Last week she was able to go to walk in clinic with assistance from her sister, however now she can barely move or walk - it "catches" and "stops her in her tracks" She does have a cane and walker but hasn't used them [place] health care transportation-3d [employer] transportation - 5 d  She can get to [employer] next week if she has appt date and time and 3 d notice    Patient Active Problem List   Diagnosis Date Noted   MRSA (methicillin resistant staph aureus) culture positive 05/04/2021   Hyperglycemia 01/01/2021   Prolonged QT interval 01/01/2021   Benign neoplasm of ascending colon    Polyp of sigmoid colon    Bipolar disorder with depression ([place]) 02/25/2017   ADD (attention deficit disorder) 02/25/2017   Marijuana use 02/13/2017   Neuropathy, peripheral 05/07/2016   Vitamin B12 deficiency 10/24/2015    Fibromyalgia 08/30/2015   Low HDL (under 40) 08/30/2015   GERD (gastroesophageal reflux disease) 08/30/2015   OP (osteoporosis) 06/30/2015   Sleep apnea 06/30/2015   Morbid (severe) obesity due to excess calories ([place]) 08/16/2014   Low back pain with sciatica 08/04/2014   Current tobacco use 08/04/2014   Polysubstance abuse ([place]) 07/04/2014   Psychoactive substance abuse ([place]) 07/04/2014   Anxiety, generalized 11/24/2013   COPD with asthma 11/18/2013   Arthritis of knee, degenerative 07/15/2013   Osteoarthritis of knee, unspecified 07/15/2013    Social History   Tobacco Use   Smoking status: Every Day    Packs/day: 0.50    Years: 36.00    Total pack years: 18.00    Types: Cigarettes    Passive exposure: Past   Smokeless tobacco: Never   Tobacco comments:    Pt using nicotine patches; 1/2 PPD as of 03/21/21  Substance Use Topics   Alcohol use: No    Alcohol/week: 0.0 standard drinks of alcohol     Current Outpatient Medications:    albuterol (VENTOLIN HFA) 108 (90 Base) MCG/ACT inhaler, Inhale 2 puffs into the lungs every 4 (four) hours as needed for wheezing or shortness of breath., Disp: 18 g, Rfl: 2   ALPRAZolam (XANAX) 0.5 MG tablet, Take 0.5 mg by mouth 4 (four) times daily., Disp: , Rfl:    atorvastatin (LIPITOR) 40 MG tablet, TAKE 1 TABLET BY MOUTH EVERY NIGHT AT BEDTIME, Disp: 90 tablet, Rfl: 3  dicyclomine (BENTYL) 20 MG tablet, Take 1 tablet (20 mg total) by mouth 3 (three) times daily as needed for spasms (abd cramping)., Disp: 20 tablet, Rfl: 1   divalproex (DEPAKOTE) 250 MG DR tablet, Take 1 tablet (250 mg total) by mouth 2 (two) times daily., Disp: 60 tablet, Rfl: 2   famotidine (PEPCID) 20 MG tablet, TAKE 1 TABLET BY MOUTH 2 TIMES DAILY AS NEEDED FOR NAUSEA/INDIGESTION/STOMACH UPSET., Disp: 60 tablet, Rfl: 1   ipratropium-albuterol (DUONEB) 0.5-2.5 (3) MG/3ML SOLN, Take 3 mLs by nebulization 3 (three) times daily as needed., Disp: 180 mL, Rfl: 1   linezolid  (ZYVOX) 600 MG tablet, Take 1 tablet (600 mg total) by mouth 2 (two) times daily., Disp: 14 tablet, Rfl: 0   lurasidone (LATUDA) 80 MG TABS tablet, Take 1 tablet (80 mg total) by mouth daily with breakfast., Disp: 90 tablet, Rfl: 0   magic mouthwash w/lidocaine SOLN, Take 5 mLs by mouth 4 (four) times daily as needed for mouth pain. Swish and spit, do not swallow the solution., Disp: 360 mL, Rfl: 0   meloxicam (MOBIC) 15 MG tablet, Take 15 mg by mouth daily., Disp: , Rfl:    mometasone-formoterol (DULERA) 100-5 MCG/ACT AERO, Inhale 2 puffs into the lungs in the morning and at bedtime., Disp: 1 each, Rfl: 0   Multiple Vitamin (MULTIVITAMIN WITH MINERALS) TABS tablet, Take 1 tablet by mouth daily., Disp: , Rfl:    mupirocin ointment (BACTROBAN) 2 %, Apply 1 Application topically 2 (two) times daily. nares, Disp: 22 g, Rfl: 0   nystatin (MYCOSTATIN/NYSTOP) powder, Apply 1 application. topically 3 (three) times daily as needed (rash in skin folds)., Disp: 60 g, Rfl: 1   nystatin ointment (MYCOSTATIN), Apply 1 application topically 2 (two) times daily., Disp: 30 g, Rfl: 0   nystatin-triamcinolone ointment (MYCOLOG), Apply 1 Application topically 2 (two) times daily., Disp: 30 g, Rfl: 0   pantoprazole (PROTONIX) 40 MG tablet, TAKE 1 TABLET BY MOUTH ONCE A DAY, Disp: 30 tablet, Rfl: 0   promethazine (PHENERGAN) 25 MG tablet, Take 25 mg by mouth 2 (two) times daily as needed., Disp: , Rfl:    Tiotropium Bromide Monohydrate (SPIRIVA RESPIMAT) 2.5 MCG/ACT AERS, Inhale 2 puffs into the lungs daily., Disp: 1 each, Rfl: 0   tiZANidine (ZANAFLEX) 4 MG tablet, Take 1 tablet (4 mg total) by mouth 3 (three) times daily as needed for muscle spasms., Disp: 30 tablet, Rfl: 0   escitalopram (LEXAPRO) 20 MG tablet, Take 1 tablet (20 mg total) by mouth daily., Disp: 30 tablet, Rfl: 2  Allergies  Allergen Reactions   Chantix [Varenicline] Nausea Only   Glycopyrrolate Rash    Oral irritation    Chart Review: I  personally reviewed active problem list, medication list, allergies, family history, social history, health maintenance, notes from last encounter, lab results, imaging with the patient/caregiver today.   Review of Systems  Constitutional: Negative.   HENT: Negative.    Eyes: Negative.   Respiratory: Negative.    Cardiovascular: Negative.   Gastrointestinal: Negative.   Endocrine: Negative.   Genitourinary: Negative.   Musculoskeletal: Negative.   Skin: Negative.   Allergic/Immunologic: Negative.   Neurological: Negative.   Hematological: Negative.   Psychiatric/Behavioral: Negative.    All other systems reviewed and are negative.    Objective:    Virtual encounter, vitals limited, only able to obtain the following There were no vitals filed for this visit. There is no height or weight on file to calculate BMI. Nursing  Note and Vital Signs reviewed.  Physical Exam Vitals and nursing note reviewed.  Neurological:     Mental Status: She is alert.  Psychiatric:        Mood and Affect: Mood normal.   Phonation clear, no distress, speaking in full and complete sentences  PE limited by telephone encounter  No results found for this or any previous visit (from the past 72 hour(s)).  Assessment and Plan:     ICD-10-CM   1. Acute hip pain, left  M25.552 Ambulatory referral to Orthopedics   onset 3 wks ago, no injury/fall/specific strain, gradually worsening, likely needs plain films - will help to get ortho UC appt - [facility]    2. Unspecified abnormalities of gait and mobility  R26.9 Ambulatory referral to Orthopedics   states she currently cannot bear weight on left leg due to hip pain, advised to use walker and avoid putting weight on L leg      -Red flags and when to present for emergency care or RTC including but not limited to new/worsening/un-resolving symptoms,  reviewed with patient at time of visit. Follow up and care instructions discussed and provided in  AVS. - I discussed the assessment and treatment plan with the patient. The patient was provided an opportunity to ask questions and all were answered. The patient agreed with the plan and demonstrated an understanding of the instructions.  - The patient was advised to call back or seek an in-person evaluation if the symptoms worsen or if the condition fails to improve as anticipated.  I provided 25 minutes of non-face-to-face time during this encounter.  Delsa Grana, PA-C 02/08/22 10:46 AM

## 2022-02-08 NOTE — Telephone Encounter (Signed)
Copied from [place] [phone]. Topic: Referral - Status >> Feb 08, 2022 11:12 AM Cyndi Bender wrote: Reason for CRM: Pt stated she had an appt with Delsa Grana today and she was told to schedule an appt with Ortho asap. Pt stated she contacted [facility] but she was told a prior authorization and a referral is needed to schedule an appt.

## 2022-02-08 NOTE — Telephone Encounter (Signed)
Called pt to ask if she was able to set an appointment and she said she has everything settled for tomorrow.

## 2022-02-08 NOTE — Telephone Encounter (Signed)
Called pt to inform her referral was placed. Pt states she called office as soon as visit was done with [name]. I told her that was too soon for her to call them and that's is why probably she was told they did not have it. On our end it shows it was completed successfully. I advised pt to call again. Pt verbalized understanding

## 2022-02-13 ENCOUNTER — Ambulatory Visit: Payer: Medicare Other

## 2022-02-21 ENCOUNTER — Ambulatory Visit: Payer: Medicare Other | Admitting: Pulmonary Disease

## 2022-02-23 ENCOUNTER — Emergency Department
Admission: EM | Admit: 2022-02-23 | Discharge: 2022-02-23 | Disposition: A | Discharge status: Home / Self Care | Payer: Medicare Other | Attending: Emergency Medicine | Admitting: Emergency Medicine

## 2022-02-23 ENCOUNTER — Emergency Department: Payer: Medicare Other

## 2022-02-23 DIAGNOSIS — J449 Chronic obstructive pulmonary disease, unspecified: Secondary | ICD-10-CM | POA: Diagnosis not present

## 2022-02-23 DIAGNOSIS — M79605 Pain in left leg: Secondary | ICD-10-CM | POA: Insufficient documentation

## 2022-02-23 LAB — CBC
HCT: 41.2 % (ref 36.0–46.0)
Hemoglobin: 13.5 g/dL (ref 12.0–15.0)
MCH: 29.2 pg (ref 26.0–34.0)
MCHC: 32.8 g/dL (ref 30.0–36.0)
MCV: 89 fL (ref 80.0–100.0)
Platelets: 238 10*3/uL (ref 150–400)
RBC: 4.63 MIL/uL (ref 3.87–5.11)
RDW: 14.5 % (ref 11.5–15.5)
WBC: 8.4 10*3/uL (ref 4.0–10.5)
nRBC: 0 % (ref 0.0–0.2)

## 2022-02-23 LAB — COMPREHENSIVE METABOLIC PANEL
ALT: 11 U/L (ref 0–44)
AST: 17 U/L (ref 15–41)
Albumin: 3.7 g/dL (ref 3.5–5.0)
Alkaline Phosphatase: 68 U/L (ref 38–126)
Anion gap: 8 (ref 5–15)
BUN: 14 mg/dL (ref 8–23)
CO2: 29 mmol/L (ref 22–32)
Calcium: 9.1 mg/dL (ref 8.9–10.3)
Chloride: 94 mmol/L — ABNORMAL LOW (ref 98–111)
Creatinine, Ser: 1.01 mg/dL — ABNORMAL HIGH (ref 0.44–1.00)
GFR, Estimated: >60 mL/min (ref >60)
Glucose, Bld: 89 mg/dL (ref 70–99)
Potassium: 4 mmol/L (ref 3.5–5.1)
Sodium: 131 mmol/L — ABNORMAL LOW (ref 135–145)
Total Bilirubin: 0.6 mg/dL (ref 0.3–1.2)
Total Protein: 6.9 g/dL (ref 6.5–8.1)

## 2022-02-23 MED ORDER — CYCLOBENZAPRINE HCL 10 MG PO TABS
5.0000 mg | ORAL_TABLET | Freq: Once | ORAL | Status: AC
  Administered 2022-02-23: 5 mg via ORAL
  Filled 2022-02-23: qty 1

## 2022-02-23 MED ORDER — PREDNISONE 20 MG PO TABS: 40.0000 mg | ORAL_TABLET | Freq: Every day | ORAL | 0 refills | Status: AC

## 2022-02-23 MED ORDER — KETOROLAC TROMETHAMINE 30 MG/ML IJ SOLN
30.0000 mg | Freq: Once | INTRAMUSCULAR | Status: AC
  Administered 2022-02-23: 30 mg via INTRAMUSCULAR
  Filled 2022-02-23: qty 1

## 2022-02-23 NOTE — Discharge Instructions (Signed)
Your CT scans show any serious underlying spinal cord injury or femur fractures.  No evidence or indication for your ongoing groin pain.  You should take prescription meds as directed.  Follow with primary provider or Ortho specialist for ongoing evaluation including MRI imaging.

## 2022-02-23 NOTE — ED Triage Notes (Signed)
Pt sts that she has been to [facility] due to her left thigh pain. Pt sts from her hip to her knee it feels like her muscles are weaved together and she is not able to put any pressure on that leg.

## 2022-02-23 NOTE — ED Provider Triage Note (Signed)
  Emergency Medicine Provider Triage Evaluation Note  Nicole Austin , a 61 y.o.female,  was evaluated in triage.  Pt complains of pain.  She states that she has been experiencing pain in her groin and left thigh, as well as tingliness, for the past few days.  She states that she has barely been able to walk due to the pain.  Denies any recent falls or injuries.  She was evaluated at [facility], where they performed x-rays, which showed nothing.  Advised her to go to the emergency department for CT scan/MRI.   Review of Systems  Positive: Leg pain Negative: Denies fever, chest pain, vomiting  Physical Exam   Vitals:   02/23/22 1637  BP: (!) 157/65  Pulse: 63  Resp: 17  Temp: 98.1 F (36.7 C)  SpO2: 95%   Gen:   Awake, no distress   Resp:  Normal effort  MSK:   Moves extremities without difficulty  Other:    Medical Decision Making  Given the patient's initial medical screening exam, the following diagnostic evaluation has been ordered. The patient will be placed in the appropriate treatment space, once one is available, to complete the evaluation and treatment. I have discussed the plan of care with the patient and I have advised the patient that an ED physician or mid-level practitioner will reevaluate their condition after the test results have been received, as the results may give them additional insight into the type of treatment they may need.    Diagnostics: Labs, CT lumbar/hip/femur.  Treatments: none immediately   Teodoro Spray, [place] 02/23/22 1704

## 2022-02-23 NOTE — ED Provider Notes (Incomplete)
[facility] Emergency Department Provider Note     Event Date/Time   First MD Initiated Contact with Patient 02/23/22 1957     (approximate)   History   Leg Pain   HPI  Nicole Austin is a 61 y.o. female with a history of DJD of the knee, anxiety, sciatica, COPD, morbid obesity, and fibromyalgia, presents to the ED for evaluation of chronic intermittent pain in the left thigh.  Patient reports pain from her hip to the knee that feels like her "muscles are weaved together.".  She has not been able to put pressure on the leg, reports difficult weightbearing gait lately.  Denies any recent injury, trauma, fall patient also denies any bladder or bowel incontinence, foot drop, saddle anesthesia.  She is under the care of [facility], who recently evaluated the patient with normal x-rays.  They have advised to report to the ED for further evaluation and advanced imaging at this time.     Physical Exam   Triage Vital Signs: ED Triage Vitals [02/23/22 1637]  Enc Vitals Group     BP (!) 157/65     Pulse Rate 63     Resp 17     Temp 98.1 F (36.7 C)     Temp Source Oral     SpO2 95 %     Weight 230 lb (104.3 kg)     Height      Head Circumference      Peak Flow      Pain Score      Pain Loc      Pain Edu?      Excl. in [place]?     Most recent vital signs: Vitals:   02/23/22 1637  BP: (!) 157/65  Pulse: 63  Resp: 17  Temp: 98.1 F (36.7 C)  SpO2: 95%    General Awake, no distress. *** {**HEENT NCAT. PERRL. EOMI. No rhinorrhea. Mucous membranes are moist. **} CV:  Good peripheral perfusion. *** RESP:  Normal effort. *** ABD:  No distention. *** {**Other: **}   ED Results / Procedures / Treatments   Labs (all labs ordered are listed, but only abnormal results are displayed) Labs Reviewed  COMPREHENSIVE METABOLIC PANEL - Abnormal; Notable for the following components:      Result Value   Sodium 131 (*)    Chloride 94 (*)     Creatinine, Ser 1.01 (*)    All other components within normal limits  CBC     EKG  ***  RADIOLOGY  {**I personally viewed and evaluated these images as part of my medical decision making, as well as reviewing the written report by the radiologist.  ED Provider Interpretation: ***  CT Lumbar Spine Wo Contrast  Result Date: 02/23/2022 CLINICAL DATA:  Lumbar radiculopathy, symptoms persist with > 6 wks treatment EXAM: CT LUMBAR SPINE WITHOUT CONTRAST TECHNIQUE: Multidetector CT imaging of the lumbar spine was performed without intravenous contrast administration. Multiplanar CT image reconstructions were also generated. RADIATION DOSE REDUCTION: This exam was performed according to the departmental dose-optimization program which includes automated exposure control, adjustment of the mA and/or kV according to patient size and/or use of iterative reconstruction technique. COMPARISON:  CT renal 05/14/2019 FINDINGS: Segmentation: 5 lumbar type vertebrae. Alignment: Normal. Vertebrae: No acute fracture or focal pathologic process. Paraspinal and other soft tissues: Negative. Disc levels: Maintained. Other: Atherosclerotic plaque. IMPRESSION: 1. None No acute displaced fracture or traumatic listhesis of the lumbar spine. 2.  Aortic  Atherosclerosis (ICD10-I70.0). Electronically Signed   By: Iven Finn M.D.   On: 02/23/2022 18:11   CT FEMUR LEFT WO CONTRAST  Result Date: 02/23/2022 CLINICAL DATA:  Fracture, femur EXAM: CT OF THE LOWER LEFT EXTREMITY WITHOUT CONTRAST TECHNIQUE: Multidetector CT imaging of the lower left extremity was performed according to the standard protocol. RADIATION DOSE REDUCTION: This exam was performed according to the departmental dose-optimization program which includes automated exposure control, adjustment of the mA and/or kV according to patient size and/or use of iterative reconstruction technique. COMPARISON:  None Available. FINDINGS: Bones/Joint/Cartilage Total  left knee arthroplasty . No CT evidence of surgical hardware complication. No evidence of fracture, dislocation, or joint effusion. No evidence of severe arthropathy. No aggressive appearing focal bone abnormality. Limited evaluation along the knee joint due to streak artifact originating from the surgical hardware. Ligaments Suboptimally assessed by CT. Muscles and Tendons Grossly unremarkable grade Soft tissues Grossly unremarkable. IMPRESSION: 1. Total left knee arthroplasty. No CT evidence of surgical hardware complication. 2.  No acute displaced fracture or dislocation. 3. Limited evaluation along the knee joint due to streak artifact originating from the surgical hardware. Electronically Signed   By: Iven Finn M.D.   On: 02/23/2022 18:05     PROCEDURES:  Critical Care performed: No  Procedures   MEDICATIONS ORDERED IN ED: Medications - No data to display   IMPRESSION / MDM / [place] / ED COURSE  I reviewed the triage vital signs and the nursing notes.                              Differential diagnosis includes, but is not limited to,    Patient's presentation is most consistent with acute complicated illness / injury requiring diagnostic workup.  Patient's diagnosis is consistent with ***. Patient will be discharged home with prescriptions for ***. Patient is to follow up with *** as needed or otherwise directed. Patient is given ED precautions to return to the ED for any worsening or new symptoms.     FINAL CLINICAL IMPRESSION(S) / ED DIAGNOSES   Final diagnoses:  None     Rx / DC Orders   ED Discharge Orders     None        Note:  This document was prepared using Dragon voice recognition software and may include unintentional dictation errors.

## 2022-02-27 ENCOUNTER — Ambulatory Visit (INDEPENDENT_AMBULATORY_CARE_PROVIDER_SITE_OTHER): Payer: Medicare Other | Admitting: Family Medicine

## 2022-02-27 ENCOUNTER — Encounter: Payer: Self-pay | Admitting: Family Medicine

## 2022-02-27 VITALS — BP 132/74 | HR 66 | Temp 98.1°F | Resp 16 | Ht 61.0 in | Wt 292.4 lb

## 2022-02-27 DIAGNOSIS — Z09 Encounter for follow-up examination after completed treatment for conditions other than malignant neoplasm: Secondary | ICD-10-CM

## 2022-02-27 DIAGNOSIS — R269 Unspecified abnormalities of gait and mobility: Secondary | ICD-10-CM

## 2022-02-27 DIAGNOSIS — M5440 Lumbago with sciatica, unspecified side: Secondary | ICD-10-CM

## 2022-02-27 DIAGNOSIS — M5417 Radiculopathy, lumbosacral region: Secondary | ICD-10-CM

## 2022-02-27 MED ORDER — KETOROLAC TROMETHAMINE 60 MG/2ML IM SOLN
60.0000 mg | Freq: Once | INTRAMUSCULAR | Status: AC
  Administered 2022-02-27: 30 mg via INTRAMUSCULAR

## 2022-02-27 MED ORDER — PREDNISONE 20 MG PO TABS: ORAL_TABLET | ORAL | 0 refills | Status: DC

## 2022-02-27 NOTE — Patient Instructions (Addendum)
Take a few more days of steroids  Make sure you take naproxen twice a day (or a different NSAID) with the pantoprazole  Continue the tylenol for pain  I will try and send out Home health to evaluate you and start some therapy  Call and follow up with [facility] ASAP for the leg and low back pain and they should be the ones to get the MRI ordered  I will put in a new referral for the back pain so they know you need help with that  Lumbosacral Radiculopathy Lumbosacral radiculopathy is a condition that involves the spinal nerves and nerve roots in the low back and bottom of the spine. The condition develops when these nerves and nerve roots move out of place or become inflamed and cause symptoms. What are the causes? This condition may be caused by: Pressure from a disk that bulges out of place (herniated disk). A disk is a plate of soft cartilage that separates bones in the spine. Disk changes that occur with age (disk degeneration). A narrowing of the bones of the lower back (spinal stenosis). A tumor. An infection. An injury that places sudden pressure on the disks that cushion the bones of your lower spine. What increases the risk? You are more likely to develop this condition if: You are a female who is 47-75 years old. You are a female who is 42-85 years old. You use improper technique when lifting things. You are overweight or live a sedentary lifestyle. You smoke. Your work requires frequent lifting. You do repetitive activities that strain the spine. What are the signs or symptoms? Symptoms of this condition include: Pain that goes down from your back into your legs (sciatica), usually on one side of the body. This is the most common symptom. The pain may be worse when you sit, cough, or sneeze. Tingling and numbness in your legs. Muscle weakness in your legs. Loss of bladder control or bowel control. How is this diagnosed? This condition may be diagnosed based on: Your  symptoms and medical history. A physical exam. If the pain lasts, you may have tests, such as: MRI. X-ray. CT scan. A type of CT scan used to examine the spinal canal after injecting a dye into your spine (myelogram). A test to measure how electrical impulses move through a nerve (nerve conduction study). A test to measure the electrical activity in muscles (electromyogram). How is this treated? In many cases, treatment is not needed for this condition. With rest, the condition usually gets better over time. If treatment is needed, it may include: Working with a physical therapist to improve strength and flexibility. Taking pain medicine. Applying heat or ice or both to the affected areas. Having chiropractic spinal manipulation. Using transcutaneous electrical nerve stimulation (TENS) therapy. Getting a steroid injection in the spine. Having surgery. This may be needed if other treatments do not help. Different types of surgery may be done depending on the cause of this condition. Follow these instructions at home: Activity Avoid bending and any other activities that make the problem worse. Maintain a proper position when standing or sitting. When standing, keep your upper back and neck straight with your shoulders pulled back. Avoid slouching. When sitting, keep your back straight and relax your shoulders. Do not round your shoulders or pull them backward. Do not sit or stand in one place for long periods of time. Take brief periods of rest throughout the day. This will reduce your pain. It is usually better to  rest by lying down or standing, not sitting. Mix in mild activity or stretching between long periods of rest. This will help to prevent stiffness and pain. Get regular exercise. Ask your health care provider what activities are safe for you. If you were shown how to do any exercises or stretches, do them as told by your health care provider. You may have to avoid lifting. Ask  your health care provider how much you can safely lift. Always use proper lifting technique, which includes: Bending your knees. Keeping the load close to your body. Avoiding twisting. Managing pain If directed, put ice on the affected area. To do this: Put ice in a plastic bag. Place a towel between your skin and the bag. Leave the ice on for 20 minutes, 2-3 times a day. Remove the ice if your skin turns bright red. This is very important. If you cannot feel pain, heat, or cold, you have a greater risk of damage to the area. If directed, apply heat to the affected area as often as told by your health care provider. Use the heat source that your health care provider recommends, such as a moist heat pack or a heating pad. Place a towel between your skin and the heat source. Leave the heat on for 20-30 minutes. Remove the heat if your skin turns bright red. This is especially important if you are unable to feel pain, heat, or cold. You have a greater risk of getting burned. Take over-the-counter and prescription medicines only as told by your health care provider. General instructions Sleep on a firm mattress in a comfortable position. Try lying on your side with your knees slightly bent. If you lie on your back, put a pillow under your knees. Ask your health care provider if the medicine prescribed to you requires you to avoid driving or using machinery. If your health care provider prescribed a diet or exercise program, follow it as told. Keep all follow-up visits. This is important. Contact a health care provider if: Your pain does not get better over time, even when taking pain medicines. Get help right away if: You develop severe pain. Your pain suddenly gets worse. You develop increasing weakness in your legs. You lose the ability to control your bladder or bowel. You have difficulty walking or balancing. You have a fever. Summary Lumbosacral radiculopathy is a condition that  occurs when the spinal nerves and nerve roots in the lower part of the spine move out of place or become inflamed and cause symptoms. Symptoms include pain, numbness, and tingling that go down from your back into your legs (sciatica), muscle weakness, and loss of bladder control or bowel control. If directed, apply ice or heat or both to the affected area as told by your health care provider. Follow instructions about activity, rest, and proper lifting technique. This information is not intended to replace advice given to you by your health care provider. Make sure you discuss any questions you have with your health care provider. Document Revised: 08/18/2020 Document Reviewed: 08/18/2020 Elsevier Patient Education  [place].

## 2022-02-27 NOTE — Progress Notes (Signed)
Patient ID: Nicole Austin, female    DOB: 06/02/1960, 62 y.o.   MRN: [id number]  PCP: Delsa Grana, PA-C  Chief Complaint  Patient presents with   Follow-up   Leg Pain    Left leg along with hip pt states not better than ER still the same meds are not helping    Subjective:   Nicole Austin is a 62 y.o. female, presents to clinic with CC of the following:  HPI  Pt here for f/up after ED visit Has seen [facility] as well - on 12/29 was sent by ortho to ED  She got CT and xray of left femur and lumbar spine- both unremarkable  Told to come back to PCP and ortho and to get MRI She had some mild relief with toradol shot in ED, has been on steroids, muscle relaxers and tylenol since 12/29 ED visit, using walker, pain is not much better, worse with any ambulation or movement, limited ability to bend over Denies saddle anesthesia, incontinence  Narrative & Impression  CLINICAL DATA:  Fracture, femur   EXAM: CT OF THE LOWER LEFT EXTREMITY WITHOUT CONTRAST   TECHNIQUE: Multidetector CT imaging of the lower left extremity was performed according to the standard protocol.   RADIATION DOSE REDUCTION: This exam was performed according to the departmental dose-optimization program which includes automated exposure control, adjustment of the mA and/or kV according to patient size and/or use of iterative reconstruction technique.   COMPARISON:  None Available.   FINDINGS: Bones/Joint/Cartilage   Total left knee arthroplasty . No CT evidence of surgical hardware complication. No evidence of fracture, dislocation, or joint effusion. No evidence of severe arthropathy. No aggressive appearing focal bone abnormality. Limited evaluation along the knee joint due to streak artifact originating from the surgical hardware.   Ligaments   Suboptimally assessed by CT.   Muscles and Tendons   Grossly unremarkable grade   Soft tissues   Grossly unremarkable.    IMPRESSION: 1. Total left knee arthroplasty. No CT evidence of surgical hardware complication. 2.  No acute displaced fracture or dislocation. 3. Limited evaluation along the knee joint due to streak artifact originating from the surgical hardware.     Electronically Signed   By: Iven Finn M.D.   On: 02/23/2022 18:05    Narrative & Impression  CLINICAL DATA:  Lumbar radiculopathy, symptoms persist with > 6 wks treatment   EXAM: CT LUMBAR SPINE WITHOUT CONTRAST   TECHNIQUE: Multidetector CT imaging of the lumbar spine was performed without intravenous contrast administration. Multiplanar CT image reconstructions were also generated.   RADIATION DOSE REDUCTION: This exam was performed according to the departmental dose-optimization program which includes automated exposure control, adjustment of the mA and/or kV according to patient size and/or use of iterative reconstruction technique.   COMPARISON:  CT renal 05/14/2019   FINDINGS: Segmentation: 5 lumbar type vertebrae.   Alignment: Normal.   Vertebrae: No acute fracture or focal pathologic process.   Paraspinal and other soft tissues: Negative.   Disc levels: Maintained.   Other: Atherosclerotic plaque.   IMPRESSION: 1. None No acute displaced fracture or traumatic listhesis of the lumbar spine. 2.  Aortic Atherosclerosis (ICD10-I70.0).     Electronically Signed   By: Iven Finn M.D.   On: 02/23/2022 18:11     Patient Active Problem List   Diagnosis Date Noted   MRSA (methicillin resistant staph aureus) culture positive 05/04/2021   Hyperglycemia 01/01/2021   Prolonged QT interval 01/01/2021  Benign neoplasm of ascending colon    Polyp of sigmoid colon    Bipolar disorder with depression ([place]) 02/25/2017   ADD (attention deficit disorder) 02/25/2017   Marijuana use 02/13/2017   Neuropathy, peripheral 05/07/2016   Vitamin B12 deficiency 10/24/2015   Fibromyalgia 08/30/2015   Low HDL  (under 40) 08/30/2015   GERD (gastroesophageal reflux disease) 08/30/2015   OP (osteoporosis) 06/30/2015   Sleep apnea 06/30/2015   Morbid (severe) obesity due to excess calories ([place]) 08/16/2014   Low back pain with sciatica 08/04/2014   Current tobacco use 08/04/2014   Polysubstance abuse ([place]) 07/04/2014   Psychoactive substance abuse ([place]) 07/04/2014   Anxiety, generalized 11/24/2013   COPD with asthma 11/18/2013   Arthritis of knee, degenerative 07/15/2013   Osteoarthritis of knee, unspecified 07/15/2013      Current Outpatient Medications:    acetaminophen (TYLENOL) 500 MG tablet, Take 2 tablets (1,000 mg total) by mouth every 8 (eight) hours as needed., Disp: 90 tablet, Rfl: 0   albuterol (VENTOLIN HFA) 108 (90 Base) MCG/ACT inhaler, Inhale 2 puffs into the lungs every 4 (four) hours as needed for wheezing or shortness of breath., Disp: 18 g, Rfl: 2   ALPRAZolam (XANAX) 0.5 MG tablet, Take 0.5 mg by mouth 4 (four) times daily., Disp: , Rfl:    atorvastatin (LIPITOR) 40 MG tablet, TAKE 1 TABLET BY MOUTH EVERY NIGHT AT BEDTIME, Disp: 90 tablet, Rfl: 3   dicyclomine (BENTYL) 20 MG tablet, Take 1 tablet (20 mg total) by mouth 3 (three) times daily as needed for spasms (abd cramping)., Disp: 20 tablet, Rfl: 1   divalproex (DEPAKOTE) 250 MG DR tablet, Take 1 tablet (250 mg total) by mouth 2 (two) times daily., Disp: 60 tablet, Rfl: 2   famotidine (PEPCID) 20 MG tablet, TAKE 1 TABLET BY MOUTH 2 TIMES DAILY AS NEEDED FOR NAUSEA/INDIGESTION/STOMACH UPSET., Disp: 60 tablet, Rfl: 1   ipratropium-albuterol (DUONEB) 0.5-2.5 (3) MG/3ML SOLN, Take 3 mLs by nebulization 3 (three) times daily as needed., Disp: 180 mL, Rfl: 1   linezolid (ZYVOX) 600 MG tablet, Take 1 tablet (600 mg total) by mouth 2 (two) times daily., Disp: 14 tablet, Rfl: 0   lurasidone (LATUDA) 80 MG TABS tablet, Take 1 tablet (80 mg total) by mouth daily with breakfast., Disp: 90 tablet, Rfl: 0   magic mouthwash w/lidocaine  SOLN, Take 5 mLs by mouth 4 (four) times daily as needed for mouth pain. Swish and spit, do not swallow the solution., Disp: 360 mL, Rfl: 0   meloxicam (MOBIC) 15 MG tablet, Take 15 mg by mouth daily., Disp: , Rfl:    mometasone-formoterol (DULERA) 100-5 MCG/ACT AERO, Inhale 2 puffs into the lungs in the morning and at bedtime., Disp: 1 each, Rfl: 0   Multiple Vitamin (MULTIVITAMIN WITH MINERALS) TABS tablet, Take 1 tablet by mouth daily., Disp: , Rfl:    mupirocin ointment (BACTROBAN) 2 %, Apply 1 Application topically 2 (two) times daily. nares, Disp: 22 g, Rfl: 0   naproxen sodium (ALEVE) 220 MG tablet, Take 1 tablet (220 mg total) by mouth 2 (two) times daily as needed., Disp: 60 tablet, Rfl: 0   nystatin (MYCOSTATIN/NYSTOP) powder, Apply 1 application. topically 3 (three) times daily as needed (rash in skin folds)., Disp: 60 g, Rfl: 1   nystatin ointment (MYCOSTATIN), Apply 1 application topically 2 (two) times daily., Disp: 30 g, Rfl: 0   nystatin-triamcinolone ointment (MYCOLOG), Apply 1 Application topically 2 (two) times daily., Disp: 30 g, Rfl: 0  pantoprazole (PROTONIX) 40 MG tablet, TAKE 1 TABLET BY MOUTH ONCE A DAY, Disp: 30 tablet, Rfl: 0   pantoprazole (PROTONIX) 40 MG tablet, Take 1 tablet (40 mg total) by mouth 2 (two) times daily., Disp: 60 tablet, Rfl: 0   predniSONE (DELTASONE) 20 MG tablet, Take 2 tablets (40 mg total) by mouth daily with breakfast for 5 days., Disp: 10 tablet, Rfl: 0   promethazine (PHENERGAN) 25 MG tablet, Take 25 mg by mouth 2 (two) times daily as needed., Disp: , Rfl:    Tiotropium Bromide Monohydrate (SPIRIVA RESPIMAT) 2.5 MCG/ACT AERS, Inhale 2 puffs into the lungs daily., Disp: 1 each, Rfl: 0   tiZANidine (ZANAFLEX) 4 MG tablet, Take 1 tablet (4 mg total) by mouth 3 (three) times daily as needed for muscle spasms., Disp: 30 tablet, Rfl: 0   escitalopram (LEXAPRO) 20 MG tablet, Take 1 tablet (20 mg total) by mouth daily., Disp: 30 tablet, Rfl:  2   Allergies  Allergen Reactions   Chantix [Varenicline] Nausea Only   Glycopyrrolate Rash    Oral irritation     Social History   Tobacco Use   Smoking status: Every Day    Packs/day: 0.50    Years: 36.00    Total pack years: 18.00    Types: Cigarettes    Passive exposure: Past   Smokeless tobacco: Never   Tobacco comments:    Pt using nicotine patches; 1/2 PPD as of 03/21/21  Vaping Use   Vaping Use: Former  Substance Use Topics   Alcohol use: No    Alcohol/week: 0.0 standard drinks of alcohol   Drug use: No      Chart Review Today: I personally reviewed active problem list, medication list, allergies, family history, social history, health maintenance, notes from last encounter, lab results, imaging with the patient/caregiver today.   Review of Systems  Constitutional: Negative.  Negative for appetite change, chills, diaphoresis, fatigue, fever and unexpected weight change.  HENT: Negative.    Eyes: Negative.   Respiratory: Negative.    Cardiovascular: Negative.   Gastrointestinal: Negative.   Endocrine: Negative.   Genitourinary: Negative.  Negative for decreased urine volume and difficulty urinating.  Musculoskeletal:  Positive for back pain and gait problem.  Skin: Negative.   Allergic/Immunologic: Negative.   Hematological: Negative.   Psychiatric/Behavioral: Negative.    All other systems reviewed and are negative.      Objective:   Vitals:   02/27/22 1455  BP: 132/74  Pulse: 66  Resp: 16  Temp: 98.1 F (36.7 C)  TempSrc: Oral  SpO2: 94%  Weight: 292 lb 6.4 oz (132.6 kg)  Height: '5\' 1"'$  (1.549 m)    Body mass index is 55.25 kg/m.  Physical Exam Vitals and nursing note reviewed.  Constitutional:      General: She is not in acute distress.    Appearance: She is well-developed. She is obese. She is not ill-appearing, toxic-appearing or diaphoretic.     Comments: Appears uncomfortable  HENT:     Head: Normocephalic and atraumatic.      Nose: Nose normal.  Eyes:     General:        Right eye: No discharge.        Left eye: No discharge.     Conjunctiva/sclera: Conjunctivae normal.  Neck:     Trachea: No tracheal deviation.  Cardiovascular:     Rate and Rhythm: Normal rate and regular rhythm.  Pulmonary:     Effort: Pulmonary effort is normal.  No respiratory distress.     Breath sounds: No stridor.  Musculoskeletal:     Comments: Positive left SLR, unable to dorsiflex, minimal plantar flexion Examined seated in WC Grossly normal sensation to light touch to bilateral LE  Skin:    General: Skin is warm and dry.     Findings: No rash.  Neurological:     Mental Status: She is alert.     Motor: No abnormal muscle tone.     Coordination: Coordination normal.     Gait: Gait abnormal (in WC and using rolling walker).  Psychiatric:        Mood and Affect: Mood normal.        Behavior: Behavior normal.      Results for orders placed or performed during the hospital encounter of 02/23/22  Comprehensive metabolic panel  Result Value Ref Range   Sodium 131 (L) 135 - 145 mmol/L   Potassium 4.0 3.5 - 5.1 mmol/L   Chloride 94 (L) 98 - 111 mmol/L   CO2 29 22 - 32 mmol/L   Glucose, Bld 89 70 - 99 mg/dL   BUN 14 8 - 23 mg/dL   Creatinine, Ser 1.01 (H) 0.44 - 1.00 mg/dL   Calcium 9.1 8.9 - 10.3 mg/dL   Total Protein 6.9 6.5 - 8.1 g/dL   Albumin 3.7 3.5 - 5.0 g/dL   AST 17 15 - 41 U/L   ALT 11 0 - 44 U/L   Alkaline Phosphatase 68 38 - 126 U/L   Total Bilirubin 0.6 0.3 - 1.2 mg/dL   GFR, Estimated >60 >60 mL/min   Anion gap 8 5 - 15  CBC  Result Value Ref Range   WBC 8.4 4.0 - 10.5 K/uL   RBC 4.63 3.87 - 5.11 MIL/uL   Hemoglobin 13.5 12.0 - 15.0 g/dL   HCT 41.2 36.0 - 46.0 %   MCV 89.0 80.0 - 100.0 fL   MCH 29.2 26.0 - 34.0 pg   MCHC 32.8 30.0 - 36.0 g/dL   RDW 14.5 11.5 - 15.5 %   Platelets 238 150 - 400 K/uL   nRBC 0.0 0.0 - 0.2 %       Assessment & Plan:     ICD-10-CM   1. Bilateral low back pain with  sciatica, sciatica laterality unspecified, unspecified chronicity  M54.40 predniSONE (DELTASONE) 20 MG tablet    Ambulatory referral to Orthopedics    Ambulatory referral to Home Health   radicular sx with + left SLR, f/up with ortho - spine specialist? needs lumbar MRI, extended steroids, NSAIDs + PPI    2. Unspecified abnormalities of gait and mobility  R26.9 Ambulatory referral to Home Health   worse mobility - will try to get [facility] assessment - f/up with ortho for further eval and tx    3. Encounter for examination following treatment at hospital  Z09 Ambulatory referral to Orthopedics   reviewed all imaging and encounter today with pt    4. Lumbosacral radiculopathy  M54.17 predniSONE (DELTASONE) 20 MG tablet    Ambulatory referral to Orthopedics    Ambulatory referral to [place]   +SLR, not improved much with steroids, dose of toradol today in office, resume NSAIDs with PPI and longer steroids - monitor GI tolerance          Delsa Grana, PA-C 02/27/22 3:10 PM

## 2022-02-27 NOTE — Addendum Note (Signed)
Addended by: Salomon Fick on: 02/27/2022 03:46 PM   Modules accepted: Orders

## 2022-02-28 ENCOUNTER — Telehealth: Payer: Self-pay

## 2022-02-28 NOTE — Telephone Encounter (Signed)
Pt called back and says she cannot wait until Friday

## 2022-02-28 NOTE — Telephone Encounter (Signed)
Called pt back inform her [name] is out of the office she would have to wait till Friday when she comes back. Otherwise she could call [facility] and see if they can do something else for her. Per PCP note: Call and follow up with [facility] ASAP for the leg and low back pain and they should be the ones to get the MRI ordered   I advised pt if pain is so bad to seek ER care. Pt verbalized understanding

## 2022-02-28 NOTE — Telephone Encounter (Signed)
Can you order? Since Nicole Austin is out  Copied from [place] [id number]. Topic: Referral - Question >> Feb 27, 2022  4:25 PM Penni Bombard wrote: Pt called saying [facility] wants Dr Lucio Edward to order the MRI for back pain.  CB#  [phone]

## 2022-03-01 DIAGNOSIS — M5416 Radiculopathy, lumbar region: Secondary | ICD-10-CM | POA: Diagnosis not present

## 2022-03-01 DIAGNOSIS — M25552 Pain in left hip: Secondary | ICD-10-CM | POA: Diagnosis not present

## 2022-03-02 ENCOUNTER — Other Ambulatory Visit: Payer: Self-pay

## 2022-03-02 DIAGNOSIS — J449 Chronic obstructive pulmonary disease, unspecified: Secondary | ICD-10-CM

## 2022-03-02 NOTE — Progress Notes (Signed)
Visit addended to include additional orders after pt could not get f/up or MRI with ortho:    ICD-10-CM   1. Bilateral low back pain with sciatica, sciatica laterality unspecified, unspecified chronicity  M54.40 predniSONE (DELTASONE) 20 MG tablet    Ambulatory referral to Orthopedics    Ambulatory referral to [place]    ketorolac (TORADOL) injection 60 mg    MR Lumbar Spine Wo Contrast    Ambulatory referral to Neurosurgery   radicular sx with + left SLR, f/up with ortho - spine specialist? needs lumbar MRI, extended steroids, NSAIDs + PPI    2. Unspecified abnormalities of gait and mobility  R26.9 Ambulatory referral to [place]    Ambulatory referral to Neurosurgery   worse mobility - will try to get [facility] assessment - f/up with ortho for further eval and tx    3. Encounter for examination following treatment at hospital  Z09 Ambulatory referral to Orthopedics   reviewed all imaging and encounter today with pt    4. Lumbosacral radiculopathy  M54.17 predniSONE (DELTASONE) 20 MG tablet    Ambulatory referral to Orthopedics    Ambulatory referral to [place]    MR Lumbar Spine Wo Contrast    Ambulatory referral to Neurosurgery   +SLR, not improved much with steroids, dose of toradol today in office, resume NSAIDs with PPI and longer steroids - monitor GI tolerance     Referral notes: to neurosurgery Severe left leg pain, limited mobility, low back pain with+SLR [facility] sent pt to hospital for CT - CT pelvis and femur neg, I f/up on pt, she was not much better with steroids NSAIDS, muscle relaxers, referred back to ortho for lumbar MRI and more assessment for specifically leg pain now suspected to be radiculopathy - they for some reason will not order MRI She has hx on chart of prior back surgery, I will try to order lumbar MRI, but will need neuro/spine eval since ortho is refusing to see the pt again or do next imaging.  Please consult   I did previously attempt to extend  steroids, keep NSAIDs high dose while protecting stomach, got pt HH eval/tx, and sent new orders/referral to [facility] to specify back/radiculopathy/sciatica I have ordered the imaging but will need specialists f/up    Delsa Grana, PA-C

## 2022-03-02 NOTE — Addendum Note (Signed)
Addended by: Delsa Grana on: 03/02/2022 05:56 PM   Modules accepted: Orders, Level of Service

## 2022-03-05 ENCOUNTER — Other Ambulatory Visit: Payer: Self-pay | Admitting: Family Medicine

## 2022-03-05 ENCOUNTER — Ambulatory Visit: Payer: Self-pay | Admitting: *Deleted

## 2022-03-05 MED ORDER — TIZANIDINE HCL 4 MG PO TABS: 4.0000 mg | ORAL_TABLET | Freq: Three times a day (TID) | ORAL | 2 refills | Status: DC | PRN

## 2022-03-05 NOTE — Telephone Encounter (Signed)
Called pt to inform her about the MRI she states is already scheduled. No HH has came to her yet.  Pt was also informed of Neurosurgery consult

## 2022-03-05 NOTE — Telephone Encounter (Signed)
Also asked about her tiZANidine (ZANAFLEX) 4 MG tablet [[id number]] I advised provider prescribed not in the office to contact there office.

## 2022-03-05 NOTE — Telephone Encounter (Signed)
Pt called to report that she is almost out of her medication,

## 2022-03-05 NOTE — Telephone Encounter (Signed)
I told her earlier provider who prescribed it not at this office FYI

## 2022-03-05 NOTE — Telephone Encounter (Signed)
Medication Refill - Medication: tiZANidine (ZANAFLEX) 4 MG tablet   Has the patient contacted their pharmacy? No.  Pt states that she only has 8 pills left and take 3 pills a day.   Preferred Pharmacy (with phone number or street name):  [facility] - [place], [place] Phone: [phone]  Fax: [phone]     Has the patient been seen for an appointment in the last year OR does the patient have an upcoming appointment? Yes.    Agent: Please be advised that RX refills may take up to 3 business days. We ask that you follow-up with your pharmacy.

## 2022-03-05 NOTE — Telephone Encounter (Signed)
Addressed in TE, for refill.

## 2022-03-05 NOTE — Telephone Encounter (Signed)
Requested medication (s) are due for refill today: yes  Requested medication (s) are on the active medication list: yes    Last refill: 01/05/21  #30  0 refills  Future visit scheduled With nurse 03/22/22  Notes to clinic: Historical Provider from ED. Pt states she is now taking Tizanidine from older refill of 2022 #180, has 6 tabs left. Pt states she has MRI of hip 03/13/22 States need med to "Get through the day."  Pt tearful  "I don't know who to get to refill it."   Please advise.   Requested Prescriptions  Pending Prescriptions Disp Refills   tiZANidine (ZANAFLEX) 4 MG tablet 30 tablet 0    Sig: Take 1 tablet (4 mg total) by mouth 3 (three) times daily as needed for muscle spasms.     Not Delegated - Cardiovascular:  Alpha-2 Agonists - tizanidine Failed - 03/05/2022 12:17 PM      Failed - This refill cannot be delegated      Passed - Valid encounter within last 6 months    Recent Outpatient Visits           6 days ago Bilateral low back pain with sciatica, sciatica laterality unspecified, unspecified chronicity   [facility] Delsa Grana, PA-C   3 weeks ago Acute hip pain, left   [facility] Delsa Grana, PA-C   2 months ago Infestation by bed bug   [place], PA-C   3 months ago Abscess   [place], FNP   4 months ago Abscess   [facility] Delsa Grana, PA-C       Future Appointments             In 2 weeks [facility], [facility]

## 2022-03-06 DIAGNOSIS — J4489 Other specified chronic obstructive pulmonary disease: Secondary | ICD-10-CM | POA: Diagnosis not present

## 2022-03-06 DIAGNOSIS — M5417 Radiculopathy, lumbosacral region: Secondary | ICD-10-CM | POA: Diagnosis not present

## 2022-03-06 DIAGNOSIS — Z87891 Personal history of nicotine dependence: Secondary | ICD-10-CM | POA: Diagnosis not present

## 2022-03-06 DIAGNOSIS — M810 Age-related osteoporosis without current pathological fracture: Secondary | ICD-10-CM | POA: Diagnosis not present

## 2022-03-06 DIAGNOSIS — Z8673 Personal history of transient ischemic attack (TIA), and cerebral infarction without residual deficits: Secondary | ICD-10-CM | POA: Diagnosis not present

## 2022-03-06 DIAGNOSIS — J449 Chronic obstructive pulmonary disease, unspecified: Secondary | ICD-10-CM | POA: Diagnosis not present

## 2022-03-06 DIAGNOSIS — M179 Osteoarthritis of knee, unspecified: Secondary | ICD-10-CM | POA: Diagnosis not present

## 2022-03-06 DIAGNOSIS — J441 Chronic obstructive pulmonary disease with (acute) exacerbation: Secondary | ICD-10-CM | POA: Diagnosis not present

## 2022-03-06 DIAGNOSIS — G629 Polyneuropathy, unspecified: Secondary | ICD-10-CM | POA: Diagnosis not present

## 2022-03-06 DIAGNOSIS — M5441 Lumbago with sciatica, right side: Secondary | ICD-10-CM | POA: Diagnosis not present

## 2022-03-06 DIAGNOSIS — G8929 Other chronic pain: Secondary | ICD-10-CM | POA: Diagnosis not present

## 2022-03-06 DIAGNOSIS — Z9181 History of falling: Secondary | ICD-10-CM | POA: Diagnosis not present

## 2022-03-06 DIAGNOSIS — M25552 Pain in left hip: Secondary | ICD-10-CM | POA: Diagnosis not present

## 2022-03-06 DIAGNOSIS — M5442 Lumbago with sciatica, left side: Secondary | ICD-10-CM | POA: Diagnosis not present

## 2022-03-06 DIAGNOSIS — M797 Fibromyalgia: Secondary | ICD-10-CM | POA: Diagnosis not present

## 2022-03-06 DIAGNOSIS — Z96652 Presence of left artificial knee joint: Secondary | ICD-10-CM | POA: Diagnosis not present

## 2022-03-08 ENCOUNTER — Emergency Department: Payer: 59

## 2022-03-08 ENCOUNTER — Emergency Department
Admission: EM | Admit: 2022-03-08 | Discharge: 2022-03-08 | Disposition: A | Discharge status: Home / Self Care | Payer: 59 | Attending: Emergency Medicine | Admitting: Emergency Medicine

## 2022-03-08 DIAGNOSIS — M79605 Pain in left leg: Secondary | ICD-10-CM | POA: Diagnosis not present

## 2022-03-08 DIAGNOSIS — E871 Hypo-osmolality and hyponatremia: Secondary | ICD-10-CM | POA: Insufficient documentation

## 2022-03-08 DIAGNOSIS — M5126 Other intervertebral disc displacement, lumbar region: Secondary | ICD-10-CM | POA: Diagnosis not present

## 2022-03-08 DIAGNOSIS — J441 Chronic obstructive pulmonary disease with (acute) exacerbation: Secondary | ICD-10-CM | POA: Diagnosis not present

## 2022-03-08 DIAGNOSIS — M25552 Pain in left hip: Secondary | ICD-10-CM | POA: Diagnosis not present

## 2022-03-08 DIAGNOSIS — L03211 Cellulitis of face: Secondary | ICD-10-CM | POA: Diagnosis not present

## 2022-03-08 DIAGNOSIS — M545 Low back pain, unspecified: Secondary | ICD-10-CM | POA: Diagnosis not present

## 2022-03-08 DIAGNOSIS — J449 Chronic obstructive pulmonary disease, unspecified: Secondary | ICD-10-CM | POA: Diagnosis not present

## 2022-03-08 DIAGNOSIS — D72829 Elevated white blood cell count, unspecified: Secondary | ICD-10-CM | POA: Diagnosis not present

## 2022-03-08 LAB — COMPREHENSIVE METABOLIC PANEL
ALT: 15 U/L (ref 0–44)
AST: 16 U/L (ref 15–41)
Albumin: 3.5 g/dL (ref 3.5–5.0)
Alkaline Phosphatase: 57 U/L (ref 38–126)
Anion gap: 10 (ref 5–15)
BUN: 15 mg/dL (ref 8–23)
CO2: 29 mmol/L (ref 22–32)
Calcium: 8.5 mg/dL — ABNORMAL LOW (ref 8.9–10.3)
Chloride: 89 mmol/L — ABNORMAL LOW (ref 98–111)
Creatinine, Ser: 1.11 mg/dL — ABNORMAL HIGH (ref 0.44–1.00)
GFR, Estimated: 56 mL/min — ABNORMAL LOW (ref >60)
Glucose, Bld: 79 mg/dL (ref 70–99)
Potassium: 4.2 mmol/L (ref 3.5–5.1)
Sodium: 128 mmol/L — ABNORMAL LOW (ref 135–145)
Total Bilirubin: 0.7 mg/dL (ref 0.3–1.2)
Total Protein: 6.4 g/dL — ABNORMAL LOW (ref 6.5–8.1)

## 2022-03-08 LAB — LIPASE, BLOOD: Lipase: 36 U/L (ref 11–51)

## 2022-03-08 LAB — CBC WITH DIFFERENTIAL/PLATELET
Abs Immature Granulocytes: 0.04 10*3/uL (ref 0.00–0.07)
Basophils Absolute: 0 10*3/uL (ref 0.0–0.1)
Basophils Relative: 0 %
Eosinophils Absolute: 0.1 10*3/uL (ref 0.0–0.5)
Eosinophils Relative: 1 %
HCT: 39.7 % (ref 36.0–46.0)
Hemoglobin: 12.9 g/dL (ref 12.0–15.0)
Immature Granulocytes: 0 %
Lymphocytes Relative: 42 %
Lymphs Abs: 4.6 10*3/uL — ABNORMAL HIGH (ref 0.7–4.0)
MCH: 29.1 pg (ref 26.0–34.0)
MCHC: 32.5 g/dL (ref 30.0–36.0)
MCV: 89.6 fL (ref 80.0–100.0)
Monocytes Absolute: 1 10*3/uL (ref 0.1–1.0)
Monocytes Relative: 9 %
Neutro Abs: 5.3 10*3/uL (ref 1.7–7.7)
Neutrophils Relative %: 48 %
Platelets: 272 10*3/uL (ref 150–400)
RBC: 4.43 MIL/uL (ref 3.87–5.11)
RDW: 14.6 % (ref 11.5–15.5)
WBC: 11.1 10*3/uL — ABNORMAL HIGH (ref 4.0–10.5)
nRBC: 0 % (ref 0.0–0.2)

## 2022-03-08 LAB — URINALYSIS, ROUTINE W REFLEX MICROSCOPIC
Bilirubin Urine: NEGATIVE
Glucose, UA: NEGATIVE mg/dL
Hgb urine dipstick: NEGATIVE
Ketones, ur: NEGATIVE mg/dL
Leukocytes,Ua: NEGATIVE
Nitrite: NEGATIVE
Protein, ur: NEGATIVE mg/dL
Specific Gravity, Urine: 1.006 (ref 1.005–1.030)
pH: 7 (ref 5.0–8.0)

## 2022-03-08 MED ORDER — AMOXICILLIN-POT CLAVULANATE 875-125 MG PO TABS: 1.0000 | ORAL_TABLET | Freq: Two times a day (BID) | ORAL | 0 refills | Status: AC

## 2022-03-08 MED ORDER — OXYCODONE-ACETAMINOPHEN 5-325 MG PO TABS
1.0000 | ORAL_TABLET | Freq: Once | ORAL | Status: AC
  Administered 2022-03-08: 1 via ORAL
  Filled 2022-03-08: qty 1

## 2022-03-08 MED ORDER — MOXIFLOXACIN HCL 0.5 % OP SOLN: 1.0000 [drp] | Freq: Three times a day (TID) | OPHTHALMIC | 0 refills | Status: AC

## 2022-03-08 MED ORDER — OXYCODONE-ACETAMINOPHEN 5-325 MG PO TABS: 1.0000 | ORAL_TABLET | Freq: Four times a day (QID) | ORAL | 0 refills | Status: AC | PRN

## 2022-03-08 MED ORDER — SODIUM CHLORIDE 0.9 % IV SOLN
3.0000 g | Freq: Once | INTRAVENOUS | Status: AC
  Administered 2022-03-08: 3 g via INTRAVENOUS
  Filled 2022-03-08: qty 8

## 2022-03-08 MED ORDER — SODIUM CHLORIDE 0.9 % IV BOLUS
1000.0000 mL | Freq: Once | INTRAVENOUS | Status: AC
  Administered 2022-03-08: 1000 mL via INTRAVENOUS

## 2022-03-08 MED ORDER — DOXYCYCLINE MONOHYDRATE 100 MG PO TABS: 100.0000 mg | ORAL_TABLET | Freq: Two times a day (BID) | ORAL | 0 refills | Status: AC

## 2022-03-08 NOTE — ED Notes (Signed)
First nurse note: Pt here via [facility] for chronic L groin pain, pt has MRI for Tuesday.   156/98 HR: 60  96% RA

## 2022-03-08 NOTE — Discharge Instructions (Addendum)
You need to take Augmentin twice daily for the next 7 days. You need to take doxycycline twice daily for the next 7 days. Please apply 1 drop of Vigamox 3 times daily for the next 7 days. You can take Percocet for pain until you see orthopedics. Please return to the emergency department for new or worsening symptoms.

## 2022-03-08 NOTE — ED Provider Notes (Signed)
[facility] Provider Note  Patient Contact: 6:36 PM (approximate)   History   Groin Pain   HPI  Nicole Austin is a 62 y.o. female presents to the emergency department with multiple medical complaints.  Patient is primarily presenting to the emergency department due to left lower extremity weakness that started yesterday.  Patient anticipates having an MRI of her lumbar spine on Tuesday ordered by orthopedics.  She states that she has had low back pain with left lower extremity radiculopathy for a long time.  No bowel or bladder incontinence or saddle anesthesia.  Patient states that she has never had difficulty bearing weight and can usually ambulate with a walker.  Patient states that she was able to hobble to the bathroom today but has otherwise been unable to ambulate to take care of herself at home.  She is apprehensive that she cannot take care of herself at home.  She also has erythema of the left upper cheek and left lower eyelid for the past 2 days with no prior history of periorbital cellulitis      Physical Exam   Triage Vital Signs: ED Triage Vitals [03/08/22 1722]  Enc Vitals Group     BP (!) 150/78     Pulse Rate 64     Resp 20     Temp 98.1 F (36.7 C)     Temp Source Oral     SpO2 95 %     Weight 230 lb (104.3 kg)     Height      Head Circumference      Peak Flow      Pain Score 10     Pain Loc      Pain Edu?      Excl. in [place]?     Most recent vital signs: Vitals:   03/08/22 1722 03/08/22 2238  BP: (!) 150/78 (!) 148/70  Pulse: 64 70  Resp: 20 19  Temp: 98.1 F (36.7 C) 98 F (36.7 C)  SpO2: 95% 98%     General: Alert and in no acute distress. Eyes:  PERRL. EOMI. Head: No acute traumatic findings ENT:      Nose: No congestion/rhinnorhea.      Mouth/Throat: Mucous membranes are moist. Neck: No stridor. No cervical spine tenderness to palpation. Cardiovascular:  Good peripheral perfusion Respiratory: Normal  respiratory effort without tachypnea or retractions. Lungs CTAB. Good air entry to the bases with no decreased or absent breath sounds. Gastrointestinal: Bowel sounds 4 quadrants. Soft and nontender to palpation. No guarding or rigidity. No palpable masses. No distention. No CVA tenderness. Musculoskeletal: Full range of motion to all extremities.  Positive straight leg raise, left.  Palpable dorsalis pedis pulse bilaterally and symmetrically. Neurologic:  No gross focal neurologic deficits are appreciated.  Skin:   No rash noted    ED Results / Procedures / Treatments   Labs (all labs ordered are listed, but only abnormal results are displayed) Labs Reviewed  URINALYSIS, ROUTINE W REFLEX MICROSCOPIC - Abnormal; Notable for the following components:      Result Value   Color, Urine STRAW (*)    APPearance CLEAR (*)    All other components within normal limits  CBC WITH DIFFERENTIAL/PLATELET - Abnormal; Notable for the following components:   WBC 11.1 (*)    Lymphs Abs 4.6 (*)    All other components within normal limits  COMPREHENSIVE METABOLIC PANEL - Abnormal; Notable for the following components:   Sodium 128 (*)  Chloride 89 (*)    Creatinine, Ser 1.11 (*)    Calcium 8.5 (*)    Total Protein 6.4 (*)    GFR, Estimated 56 (*)    All other components within normal limits  LIPASE, BLOOD       RADIOLOGY  I personally viewed and evaluated these images as part of my medical decision making, as well as reviewing the written report by the radiologist.  ED Provider Interpretation: No acute abnormality of the lumbar spine.  Mild degenerative changes of the left hip.   PROCEDURES:  Critical Care performed: No  Procedures   MEDICATIONS ORDERED IN ED: Medications  Ampicillin-Sulbactam (UNASYN) 3 g in sodium chloride 0.9 % 100 mL IVPB (0 g Intravenous Stopped 03/08/22 2012)  sodium chloride 0.9 % bolus 1,000 mL (0 mLs Intravenous Stopped 03/08/22 2156)   oxyCODONE-acetaminophen (PERCOCET/ROXICET) 5-325 MG per tablet 1 tablet (1 tablet Oral Given 03/08/22 2214)     IMPRESSION / MDM / ASSESSMENT AND PLAN / ED COURSE  I reviewed the triage vital signs and the nursing notes.                              Assessment and plan:  Low back pain with radiculopathy Facial cellulitis  62 year old female presents to the emergency department with low back pain with left lower extremity radiculopathy.  Vital signs are reassuring at triage.  On exam, patient was alert, active and nontoxic-appearing.  CMP indicated hyponatremia but only mildly compared to patient's baseline.  Patient had mildly elevated white blood cell count.  Urinalysis reassuring.  MRI of the lumbar spine showed no acute abnormality and no large disc herniations or central cord compression.  X-ray of the left hip showed mild osteoarthritic changes but no acute abnormality.  Patient was able to ambulate fairly easily from her exam table to the restroom.  Patient feels comfortable at this time being discharged.  She was discharged with Augmentin and doxycycline for facial cellulitis and was also prescribed Vigamox drops as I suspect patient has facial cellulitis secondary to chalazion.  Patient was given IV Unasyn in the emergency department.  Return precautions were given to return if the symptoms seem to be worsening at home.  All patient questions were answered.   FINAL CLINICAL IMPRESSION(S) / ED DIAGNOSES   Final diagnoses:  Facial cellulitis  Acute bilateral low back pain without sciatica     Rx / DC Orders   ED Discharge Orders          Ordered    amoxicillin-clavulanate (AUGMENTIN) 875-125 MG tablet  2 times daily        03/08/22 2137    doxycycline (ADOXA) 100 MG tablet  2 times daily        03/08/22 2137    moxifloxacin (VIGAMOX) 0.5 % ophthalmic solution  3 times daily        03/08/22 2138    oxyCODONE-acetaminophen (PERCOCET/ROXICET) 5-325 MG tablet  Every 6 hours  PRN        03/08/22 2139             Note:  This document was prepared using Dragon voice recognition software and may include unintentional dictation errors.   Nicole Mare [place], PA-C 03/08/22 2305    Harvest Dark, MD 03/09/22 1926

## 2022-03-08 NOTE — ED Triage Notes (Signed)
Pt sts that she has been having groin and leg pain for several months. Pt has an apt with orthopedics soon.

## 2022-03-08 NOTE — ED Notes (Signed)
Pt assisted to bathroom, pt ambulatory with walker and stand-by assist.  Urine collected and sent to lab. Primary RN updated.

## 2022-03-09 DIAGNOSIS — Z87891 Personal history of nicotine dependence: Secondary | ICD-10-CM | POA: Diagnosis not present

## 2022-03-09 DIAGNOSIS — M5442 Lumbago with sciatica, left side: Secondary | ICD-10-CM | POA: Diagnosis not present

## 2022-03-09 DIAGNOSIS — M810 Age-related osteoporosis without current pathological fracture: Secondary | ICD-10-CM | POA: Diagnosis not present

## 2022-03-09 DIAGNOSIS — Z96652 Presence of left artificial knee joint: Secondary | ICD-10-CM | POA: Diagnosis not present

## 2022-03-09 DIAGNOSIS — Z8673 Personal history of transient ischemic attack (TIA), and cerebral infarction without residual deficits: Secondary | ICD-10-CM | POA: Diagnosis not present

## 2022-03-09 DIAGNOSIS — M179 Osteoarthritis of knee, unspecified: Secondary | ICD-10-CM | POA: Diagnosis not present

## 2022-03-09 DIAGNOSIS — M797 Fibromyalgia: Secondary | ICD-10-CM | POA: Diagnosis not present

## 2022-03-09 DIAGNOSIS — M25552 Pain in left hip: Secondary | ICD-10-CM | POA: Diagnosis not present

## 2022-03-09 DIAGNOSIS — M5441 Lumbago with sciatica, right side: Secondary | ICD-10-CM | POA: Diagnosis not present

## 2022-03-09 DIAGNOSIS — J4489 Other specified chronic obstructive pulmonary disease: Secondary | ICD-10-CM | POA: Diagnosis not present

## 2022-03-09 DIAGNOSIS — G629 Polyneuropathy, unspecified: Secondary | ICD-10-CM | POA: Diagnosis not present

## 2022-03-09 DIAGNOSIS — Z9181 History of falling: Secondary | ICD-10-CM | POA: Diagnosis not present

## 2022-03-09 DIAGNOSIS — G8929 Other chronic pain: Secondary | ICD-10-CM | POA: Diagnosis not present

## 2022-03-09 DIAGNOSIS — M5417 Radiculopathy, lumbosacral region: Secondary | ICD-10-CM | POA: Diagnosis not present

## 2022-03-13 ENCOUNTER — Emergency Department: Payer: 59

## 2022-03-13 ENCOUNTER — Emergency Department
Admission: EM | Admit: 2022-03-13 | Discharge: 2022-03-14 | Disposition: A | Discharge status: Home / Self Care | Payer: 59 | Attending: Student in an Organized Health Care Education/Training Program | Admitting: Student in an Organized Health Care Education/Training Program

## 2022-03-13 DIAGNOSIS — M5126 Other intervertebral disc displacement, lumbar region: Secondary | ICD-10-CM | POA: Diagnosis not present

## 2022-03-13 DIAGNOSIS — M179 Osteoarthritis of knee, unspecified: Secondary | ICD-10-CM | POA: Diagnosis not present

## 2022-03-13 DIAGNOSIS — J4489 Other specified chronic obstructive pulmonary disease: Secondary | ICD-10-CM | POA: Diagnosis not present

## 2022-03-13 DIAGNOSIS — M5417 Radiculopathy, lumbosacral region: Secondary | ICD-10-CM | POA: Diagnosis not present

## 2022-03-13 DIAGNOSIS — Z9181 History of falling: Secondary | ICD-10-CM | POA: Diagnosis not present

## 2022-03-13 DIAGNOSIS — M797 Fibromyalgia: Secondary | ICD-10-CM | POA: Diagnosis not present

## 2022-03-13 DIAGNOSIS — M810 Age-related osteoporosis without current pathological fracture: Secondary | ICD-10-CM | POA: Diagnosis not present

## 2022-03-13 DIAGNOSIS — Z8673 Personal history of transient ischemic attack (TIA), and cerebral infarction without residual deficits: Secondary | ICD-10-CM | POA: Diagnosis not present

## 2022-03-13 DIAGNOSIS — M87052 Idiopathic aseptic necrosis of left femur: Secondary | ICD-10-CM | POA: Diagnosis not present

## 2022-03-13 DIAGNOSIS — K769 Liver disease, unspecified: Secondary | ICD-10-CM | POA: Diagnosis not present

## 2022-03-13 DIAGNOSIS — R6889 Other general symptoms and signs: Secondary | ICD-10-CM | POA: Diagnosis not present

## 2022-03-13 DIAGNOSIS — M25552 Pain in left hip: Secondary | ICD-10-CM | POA: Diagnosis not present

## 2022-03-13 DIAGNOSIS — M5442 Lumbago with sciatica, left side: Secondary | ICD-10-CM | POA: Diagnosis not present

## 2022-03-13 DIAGNOSIS — M1612 Unilateral primary osteoarthritis, left hip: Secondary | ICD-10-CM | POA: Diagnosis not present

## 2022-03-13 DIAGNOSIS — Z87891 Personal history of nicotine dependence: Secondary | ICD-10-CM | POA: Diagnosis not present

## 2022-03-13 DIAGNOSIS — G629 Polyneuropathy, unspecified: Secondary | ICD-10-CM | POA: Diagnosis not present

## 2022-03-13 DIAGNOSIS — G8929 Other chronic pain: Secondary | ICD-10-CM | POA: Diagnosis not present

## 2022-03-13 DIAGNOSIS — R109 Unspecified abdominal pain: Secondary | ICD-10-CM | POA: Diagnosis not present

## 2022-03-13 DIAGNOSIS — Z96652 Presence of left artificial knee joint: Secondary | ICD-10-CM | POA: Diagnosis not present

## 2022-03-13 DIAGNOSIS — M5441 Lumbago with sciatica, right side: Secondary | ICD-10-CM | POA: Diagnosis not present

## 2022-03-13 DIAGNOSIS — M87852 Other osteonecrosis, left femur: Secondary | ICD-10-CM | POA: Diagnosis not present

## 2022-03-13 DIAGNOSIS — Z743 Need for continuous supervision: Secondary | ICD-10-CM | POA: Diagnosis not present

## 2022-03-13 LAB — BASIC METABOLIC PANEL
Anion gap: 7 (ref 5–15)
BUN: 10 mg/dL (ref 8–23)
CO2: 26 mmol/L (ref 22–32)
Calcium: 9.1 mg/dL (ref 8.9–10.3)
Chloride: 96 mmol/L — ABNORMAL LOW (ref 98–111)
Creatinine, Ser: 0.9 mg/dL (ref 0.44–1.00)
GFR, Estimated: >60 mL/min (ref >60)
Glucose, Bld: 89 mg/dL (ref 70–99)
Potassium: 4.2 mmol/L (ref 3.5–5.1)
Sodium: 129 mmol/L — ABNORMAL LOW (ref 135–145)

## 2022-03-13 LAB — CBC WITH DIFFERENTIAL/PLATELET
Abs Immature Granulocytes: 0.02 10*3/uL (ref 0.00–0.07)
Basophils Absolute: 0 10*3/uL (ref 0.0–0.1)
Basophils Relative: 0 %
Eosinophils Absolute: 0.6 10*3/uL — ABNORMAL HIGH (ref 0.0–0.5)
Eosinophils Relative: 7 %
HCT: 39.3 % (ref 36.0–46.0)
Hemoglobin: 12.9 g/dL (ref 12.0–15.0)
Immature Granulocytes: 0 %
Lymphocytes Relative: 22 %
Lymphs Abs: 1.9 10*3/uL (ref 0.7–4.0)
MCH: 29.3 pg (ref 26.0–34.0)
MCHC: 32.8 g/dL (ref 30.0–36.0)
MCV: 89.1 fL (ref 80.0–100.0)
Monocytes Absolute: 1 10*3/uL (ref 0.1–1.0)
Monocytes Relative: 11 %
Neutro Abs: 5.3 10*3/uL (ref 1.7–7.7)
Neutrophils Relative %: 60 %
Platelets: 250 10*3/uL (ref 150–400)
RBC: 4.41 MIL/uL (ref 3.87–5.11)
RDW: 14.7 % (ref 11.5–15.5)
WBC: 8.9 10*3/uL (ref 4.0–10.5)
nRBC: 0 % (ref 0.0–0.2)

## 2022-03-13 LAB — URINALYSIS, ROUTINE W REFLEX MICROSCOPIC
Bilirubin Urine: NEGATIVE
Glucose, UA: NEGATIVE mg/dL
Hgb urine dipstick: NEGATIVE
Ketones, ur: 5 mg/dL — AB
Leukocytes,Ua: NEGATIVE
Nitrite: NEGATIVE
Protein, ur: NEGATIVE mg/dL
Specific Gravity, Urine: 1.016 (ref 1.005–1.030)
pH: 7 (ref 5.0–8.0)

## 2022-03-13 MED ORDER — LORAZEPAM 1 MG PO TABS
0.5000 mg | ORAL_TABLET | ORAL | Status: DC | PRN
  Administered 2022-03-13: 0.5 mg via ORAL
  Filled 2022-03-13: qty 1

## 2022-03-13 MED ORDER — OXYCODONE-ACETAMINOPHEN 5-325 MG PO TABS: 1.0000 | ORAL_TABLET | ORAL | 0 refills | Status: DC | PRN

## 2022-03-13 MED ORDER — OXYCODONE-ACETAMINOPHEN 5-325 MG PO TABS
1.0000 | ORAL_TABLET | Freq: Once | ORAL | Status: AC
  Administered 2022-03-13: 1 via ORAL
  Filled 2022-03-13: qty 1

## 2022-03-13 NOTE — ED Notes (Signed)
See triage note. Pt is here for L hip pain and groin pain. Was unable to get MRI done today L hip and groin area because she was in too much pain.

## 2022-03-13 NOTE — ED Triage Notes (Signed)
Pt presents to the ED via [facility] from home due to ongoing hip pain. Pt was recently seen here for the same complaint. Pt state it is her hips and groin area that is causing her pain. Pt states "I want a MRI of my hips". Pt went to [facility] earlier today and was unable to complete scans do to pain in her hips. Pt denies any other complaint at this moment. Pt A&OX4

## 2022-03-13 NOTE — ED Provider Notes (Addendum)
[facility] Provider Note    Event Date/Time   First MD Initiated Contact with Patient 03/13/22 2003     (approximate)   History   Hip Pain   HPI  Nicole Austin is a 62 y.o. female who presents to the ER for evaluation of progressively worsening chronic left hip pain and groin pain.  Patient has had extensive workup including CT imaging as well as MRI of her lumbar spine and was to have outpatient MRI of her left hip at Ortho clinic today but was unable to tolerate due to pain while laying flat on MRI.  Was sent from Ortho clinic to the ER for pain control to complete MRI.  She denies any fevers no interval falls.  States that the pain has been daily and constant for several months.  No fevers or chills.     Physical Exam   Triage Vital Signs: ED Triage Vitals  Enc Vitals Group     BP 03/13/22 1838 134/65     Pulse Rate 03/13/22 1838 75     Resp 03/13/22 1838 18     Temp 03/13/22 1838 98 F (36.7 C)     Temp Source 03/13/22 1838 Oral     SpO2 03/13/22 1838 93 %     Weight 03/13/22 1839 229 lb 15 oz (104.3 kg)     Height 03/13/22 1839 '5\' 1"'$  (1.549 m)     Head Circumference --      Peak Flow --      Pain Score 03/13/22 1838 10     Pain Loc --      Pain Edu? --      Excl. in [place]? --     Most recent vital signs: Vitals:   03/13/22 1838 03/13/22 2009  BP: 134/65   Pulse: 75   Resp: 18   Temp: 98 F (36.7 C)   SpO2: 93% 95%     Constitutional: Alert  Eyes: Conjunctivae are normal.  Head: Atraumatic. Nose: No congestion/rhinnorhea. Mouth/Throat: Mucous membranes are moist.   Neck: Painless ROM.  Cardiovascular:   Good peripheral circulation. Respiratory: Normal respiratory effort.  No retractions.  Gastrointestinal: Soft and nontender.  Musculoskeletal:  no deformity, pain with log roll of left hip Neurologic:  MAE spontaneously. No gross focal neurologic deficits are appreciated.  Skin:  Skin is warm, dry and intact. No rash  noted. Psychiatric: Mood and affect are normal. Speech and behavior are normal.    ED Results / Procedures / Treatments   Labs (all labs ordered are listed, but only abnormal results are displayed) Labs Reviewed  URINALYSIS, ROUTINE W REFLEX MICROSCOPIC - Abnormal; Notable for the following components:      Result Value   Color, Urine YELLOW (*)    APPearance CLEAR (*)    Ketones, ur 5 (*)    All other components within normal limits  CBC WITH DIFFERENTIAL/PLATELET - Abnormal; Notable for the following components:   Eosinophils Absolute 0.6 (*)    All other components within normal limits  BASIC METABOLIC PANEL - Abnormal; Notable for the following components:   Sodium 129 (*)    Chloride 96 (*)    All other components within normal limits     EKG     RADIOLOGY Please see ED Course for my review and interpretation.  I personally reviewed all radiographic images ordered to evaluate for the above acute complaints and reviewed radiology reports and findings.  These findings were personally discussed  with the patient.  Please see medical record for radiology report.    PROCEDURES:  Critical Care performed:   Procedures   MEDICATIONS ORDERED IN ED: Medications  LORazepam (ATIVAN) tablet 0.5 mg (0.5 mg Oral Given 03/13/22 2032)  oxyCODONE-acetaminophen (PERCOCET/ROXICET) 5-325 MG per tablet 1 tablet (1 tablet Oral Given 03/13/22 2308)     IMPRESSION / MDM / ASSESSMENT AND PLAN / ED COURSE  I reviewed the triage vital signs and the nursing notes.                              Differential diagnosis includes, but is not limited to, fracture, dislocation, contusion, bursitis, arthritis, radiculopathy, stenosis, stone, UTI, hernia, mass  Patient sent over from Ortho clinic for MRI.  MRI ordered but given age risk factors as well as order CT imaging due to concern for possible stone colitis or mass.   Clinical Course as of 03/13/22 2342  Tue Mar 13, 2022  2148 MRI of  the left hip with evidence of AVN.  Will consult Ortho. [PR]  2237 CT imaging on my review and interpretation does not show any evidence of stone hernia or mass. [PR]  2258 Still not heard back from Ortho have repaged.  Patient be signed out to oncoming physician pending follow-up Ortho consultation and recommendations. [PR]    Clinical Course User Index [PR] Merlyn Lot, MD    Case discussed with presents care of orthopedics.  Given patient's findings will be appropriate for outpatient follow-up for elective management total hip.  Patient has walker at home.  Requesting wheelchair to help with assistance while at home.  Will give another prescription for pain medication.  She does appear stable and appropriate for outpatient follow-up  FINAL CLINICAL IMPRESSION(S) / ED DIAGNOSES   Final diagnoses:  Avascular necrosis of left femur ([place])     Rx / DC Orders   ED Discharge Orders          Ordered    For home use only DME standard manual wheelchair with seat cushion       Comments: Patient suffers from left hip AVN which impairs their ability to perform daily activities like bathing, dressing, grooming, and toileting in the home.  A walker will not resolve issue with performing activities of daily living. A wheelchair will allow patient to safely perform daily activities. Patient can safely propel the wheelchair in the home or has a caregiver who can provide assistance. Length of need 6 months . Accessories: elevating leg rests (ELRs), wheel locks, extensions and anti-tippers.   03/13/22 2340    oxyCODONE-acetaminophen (PERCOCET) 5-325 MG tablet  Every 4 hours PRN        03/13/22 2340             Note:  This document was prepared using Dragon voice recognition software and may include unintentional dictation errors.      Merlyn Lot, MD 03/13/22 2342

## 2022-03-13 NOTE — ED Notes (Signed)
Patient verbalizes understanding of discharge instructions. Opportunity for questioning and answers were provided. Armband removed by staff, pt discharged from ED. Wheeled out to lobby, awaiting for sister to come pick her up

## 2022-03-14 ENCOUNTER — Telehealth: Payer: Self-pay

## 2022-03-14 ENCOUNTER — Telehealth: Payer: Self-pay | Admitting: Family Medicine

## 2022-03-14 NOTE — Telephone Encounter (Signed)
Patient states its a wheel chair not a walker.

## 2022-03-14 NOTE — Telephone Encounter (Signed)
Received a call from ED nurse that patient calling asking how to go about getting a wheelchair that was ordered while she was seen in the emergency department last night.   RNCM is able to see the DME order for wheelchair.   RNCM called and gave the referral for Wheelchair to [facility] accepted referral.  Darnelle Maffucci will send to his intake team.  If they cannot deliver today he will get it delivered by tomorrow.

## 2022-03-14 NOTE — Telephone Encounter (Signed)
Copied from [place] [id number]. Topic: General - Other >> Mar 14, 2022  8:53 AM Sabas Sous wrote: Reason for CRM: Pt called requesting to speak to the clinic about instructions she received from the hospital. Please advise  Best contact: [phone]  Regarding orders for a wheelchair, cannot use her walker, needs call back today

## 2022-03-14 NOTE — Telephone Encounter (Signed)
Spoke to patient, pt states she is in the need of a walker. I advised in order for that order to be put in she will need to do an appointment. I do see that hospital put in the order but unsure if they actually sent it to a place for equipment. Transferred to [facility] to schedule.

## 2022-03-14 NOTE — Progress Notes (Deleted)
   Established Patient Office Visit  Subjective   Patient ID: Nicole Austin, female    DOB: Nov 17, 1960  Age: 62 y.o. MRN: [id number]  No chief complaint on file.   HPI   Patient is here to discuss need for DME items and ER follow up. Patient is new to me. She was seen in the ER 03/13/21 for progressively worsening chronic left hip pain. She was being evaluated by Orthopedics outpatient but she could not tolerate MRI due to pain and had to be medicated in the ER for complete MRI. Left hip x-ray showing no acute bony abnormality. Left hip MRI showing AVN of the left femoral head with surrounding marrow edema extending into the femoral neck near complete loss of the articular cartilage with multiple area of subchondral collapse as well as subchondral marrow edema of the acetabulum, small area of subchondral necrosis of the right femoral head, likely subacute to chronic AVN of the right hip. MRI of lumbar spine with chronic degenerative changes. CT A/P negative for acute process in the abdomen and pelvis.   {History (Optional):23778}  ROS    Objective:     LMP  (LMP Unknown)  {Vitals History (Optional):23777}  Physical Exam   No results found for any visits on 03/15/22.  {Labs (Optional):23779}  The ASCVD Risk score (Arnett DK, et al., 2019) failed to calculate for the following reasons:   The valid total cholesterol range is 130 to 320 mg/dL    Assessment & Plan:   Problem List Items Addressed This Visit   None   No follow-ups on file.    [name], DO

## 2022-03-15 ENCOUNTER — Ambulatory Visit: Payer: Medicare Other | Admitting: Internal Medicine

## 2022-03-15 DIAGNOSIS — Z8673 Personal history of transient ischemic attack (TIA), and cerebral infarction without residual deficits: Secondary | ICD-10-CM | POA: Diagnosis not present

## 2022-03-15 DIAGNOSIS — M797 Fibromyalgia: Secondary | ICD-10-CM | POA: Diagnosis not present

## 2022-03-15 DIAGNOSIS — Z96652 Presence of left artificial knee joint: Secondary | ICD-10-CM | POA: Diagnosis not present

## 2022-03-15 DIAGNOSIS — G629 Polyneuropathy, unspecified: Secondary | ICD-10-CM | POA: Diagnosis not present

## 2022-03-15 DIAGNOSIS — M5417 Radiculopathy, lumbosacral region: Secondary | ICD-10-CM | POA: Diagnosis not present

## 2022-03-15 DIAGNOSIS — M5442 Lumbago with sciatica, left side: Secondary | ICD-10-CM | POA: Diagnosis not present

## 2022-03-15 DIAGNOSIS — M87059 Idiopathic aseptic necrosis of unspecified femur: Secondary | ICD-10-CM | POA: Diagnosis not present

## 2022-03-15 DIAGNOSIS — G8929 Other chronic pain: Secondary | ICD-10-CM | POA: Diagnosis not present

## 2022-03-15 DIAGNOSIS — M25552 Pain in left hip: Secondary | ICD-10-CM | POA: Diagnosis not present

## 2022-03-15 DIAGNOSIS — Z87891 Personal history of nicotine dependence: Secondary | ICD-10-CM | POA: Diagnosis not present

## 2022-03-15 DIAGNOSIS — J4489 Other specified chronic obstructive pulmonary disease: Secondary | ICD-10-CM | POA: Diagnosis not present

## 2022-03-15 DIAGNOSIS — M179 Osteoarthritis of knee, unspecified: Secondary | ICD-10-CM | POA: Diagnosis not present

## 2022-03-15 DIAGNOSIS — M810 Age-related osteoporosis without current pathological fracture: Secondary | ICD-10-CM | POA: Diagnosis not present

## 2022-03-15 DIAGNOSIS — Z9181 History of falling: Secondary | ICD-10-CM | POA: Diagnosis not present

## 2022-03-15 DIAGNOSIS — M5441 Lumbago with sciatica, right side: Secondary | ICD-10-CM | POA: Diagnosis not present

## 2022-03-16 ENCOUNTER — Ambulatory Visit: Payer: Medicare Other | Admitting: Internal Medicine

## 2022-03-19 DIAGNOSIS — M1612 Unilateral primary osteoarthritis, left hip: Secondary | ICD-10-CM | POA: Diagnosis not present

## 2022-03-19 DIAGNOSIS — M87852 Other osteonecrosis, left femur: Secondary | ICD-10-CM | POA: Diagnosis not present

## 2022-03-21 ENCOUNTER — Encounter: Payer: Self-pay | Admitting: Family Medicine

## 2022-03-21 ENCOUNTER — Encounter: Payer: Self-pay | Admitting: Orthopedic Surgery

## 2022-03-21 ENCOUNTER — Ambulatory Visit (INDEPENDENT_AMBULATORY_CARE_PROVIDER_SITE_OTHER): Payer: 59 | Admitting: Family Medicine

## 2022-03-21 ENCOUNTER — Ambulatory Visit: Payer: 59 | Admitting: Urgent Care

## 2022-03-21 ENCOUNTER — Encounter
Admission: RE | Admit: 2022-03-21 | Discharge: 2022-03-21 | Disposition: A | Discharge status: Home / Self Care | Payer: 59 | Source: Ambulatory Visit | Attending: Orthopedic Surgery | Admitting: Orthopedic Surgery

## 2022-03-21 ENCOUNTER — Other Ambulatory Visit: Payer: Self-pay | Admitting: Orthopedic Surgery

## 2022-03-21 VITALS — BP 113/75 | HR 95 | Temp 98.6°F | Resp 18 | Ht 61.0 in | Wt 229.9 lb

## 2022-03-21 DIAGNOSIS — K635 Polyp of colon: Secondary | ICD-10-CM | POA: Diagnosis not present

## 2022-03-21 DIAGNOSIS — Z01818 Encounter for other preprocedural examination: Secondary | ICD-10-CM | POA: Diagnosis not present

## 2022-03-21 DIAGNOSIS — M87051 Idiopathic aseptic necrosis of right femur: Secondary | ICD-10-CM | POA: Diagnosis not present

## 2022-03-21 DIAGNOSIS — Z720 Tobacco use: Secondary | ICD-10-CM

## 2022-03-21 DIAGNOSIS — Z0000 Encounter for general adult medical examination without abnormal findings: Secondary | ICD-10-CM

## 2022-03-21 DIAGNOSIS — M8700 Idiopathic aseptic necrosis of unspecified bone: Secondary | ICD-10-CM | POA: Insufficient documentation

## 2022-03-21 DIAGNOSIS — M87059 Idiopathic aseptic necrosis of unspecified femur: Secondary | ICD-10-CM | POA: Insufficient documentation

## 2022-03-21 DIAGNOSIS — Z22322 Carrier or suspected carrier of Methicillin resistant Staphylococcus aureus: Secondary | ICD-10-CM

## 2022-03-21 DIAGNOSIS — Z01812 Encounter for preprocedural laboratory examination: Secondary | ICD-10-CM

## 2022-03-21 DIAGNOSIS — J4489 Other specified chronic obstructive pulmonary disease: Secondary | ICD-10-CM | POA: Diagnosis not present

## 2022-03-21 HISTORY — DX: Other forms of dyspnea: R06.09

## 2022-03-21 HISTORY — DX: Carrier or suspected carrier of methicillin resistant Staphylococcus aureus: Z22.322

## 2022-03-21 HISTORY — DX: Personal history of other mental and behavioral disorders: Z86.59

## 2022-03-21 HISTORY — DX: Age-related osteoporosis without current pathological fracture: M81.0

## 2022-03-21 LAB — TYPE AND SCREEN
ABO/RH(D): O POS
Antibody Screen: NEGATIVE

## 2022-03-21 LAB — SURGICAL PCR SCREEN
MRSA, PCR: POSITIVE — AB
Staphylococcus aureus: POSITIVE — AB

## 2022-03-21 NOTE — Patient Instructions (Signed)
It was great to see you!  Our plans for today:  - Call [facility] at [phone] to schedule your colonoscopy. - Call the [facility] at [phone] to schedule your mammogram. - Review the information on advanced directives and let us know if you have questions or if you choose to put any of your wishes in writing so we can have a copy on your chart. - Follow up with your regular provider 1-2 months after your surgery so we can do lab workup and follow up on your chronic conditions.   Take care and seek immediate care sooner if you develop any concerns.   Dr. Ky Barban  Things to do to keep yourself healthy  - Exercise at least 30-45 minutes a day, 3-4 days a week.  - Eat a low-fat diet with lots of fruits and vegetables, up to 7-9 servings per day.  - Seatbelts can save your life. Wear them always.  - Smoke detectors on every level of your home, check batteries every year.  - Eye Doctor - have an eye exam every 1-2 years  - Safe sex - if you may be exposed to STDs, use a condom.  - Alcohol -  If you drink, do it moderately, less than 2 drinks per day.  - [place]. Choose someone to speak for you if you are not able. https://www.prepareforyourcare.org is a great website to help you navigate this. - Depression is common in our stressful world.If you're feeling down or losing interest in things you normally enjoy, please come in for a visit.  - Violence - If anyone is threatening or hurting you, please call immediately.

## 2022-03-21 NOTE — H&P (Signed)
NAME: Nicole Austin MRN:   [id number] DOB:   [birth year]     HISTORY AND PHYSICAL  CHIEF COMPLAINT:  left hip pain  HISTORY:   Nicole Sandy Combsis a 62 y.o. female  with left  Hip Pain Patient complains of left hip pain. Onset of the symptoms was several years ago. Inciting event: known DJD. The patient reports the hip pain is worse with weight bearing. Associated symptoms: none. Aggravating symptoms include: any weight bearing. Patient has had prior hip problems. Previous visits for this problem: yes, last seen several weeks ago by Dr. Harlow Mares . Evaluation to date: plain films, which were abnormal  osteoarthritis . Treatment to date: OTC analgesics, which have been somewhat effective, prescription analgesics, which have been somewhat effective, and home exercise program, which has been somewhat effective.    Plan for left total hip replacement  PAST MEDICAL HISTORY:   Past Medical History:  Diagnosis Date   ADHD (attention deficit hyperactivity disorder)    Apnea, sleep 06/30/2015   Arthritis    Asthma    Asthma with acute exacerbation 06/30/2015   Bipolar 1 disorder (HCC)    Chronic pain    COPD (chronic obstructive pulmonary disease) ([place]) 11/2018   **LUNG SOUNDS WHEEZING AND WITH RALES**   COPD, moderate ([place]) 11/18/2013   Overview:  Last Assessment & Plan:  Refer to pulmonologist Overview:  Overview:  Last Assessment & Plan:  Pneumonia vaccine offered and given today; she did not tolerate LABA; will start her on Spiriva; stop combivent neb; use only SABA neb OR inhaler, not both; smoking cessation urged, and I am here to help if she wants assistance quitting; f/u in 4 weeks Last Assessment & Plan:  Pneumonia vaccin   Depression    Dyspnea    with exertion   GERD (gastroesophageal reflux disease) 08/30/2015   Gout 08/16/2014   History of acute myocardial infarction 06/30/2015   Overview:  [facility]    HPV (human papilloma virus) infection 07/17/2013   Hyperlipidemia    Low HDL (under 40)  08/30/2015   MI (myocardial infarction) ([place]) 2018   Parkinson disease    Pre-diabetes    Prediabetes 05/20/2016   PTSD (post-traumatic stress disorder)    history of panic attacks   PTSD (post-traumatic stress disorder)    PTSD (post-traumatic stress disorder)    Restless leg syndrome    Rhabdomyolysis    Sleep apnea 06/30/2015   Stage 3 severe COPD by GOLD classification ([place]) 11/18/2013   Stroke ([place]) 2018   Tremors of nervous system    hands   Uncomplicated asthma 0/02/270   Varicella 02/25/2017    PAST SURGICAL HISTORY:   Past Surgical History:  Procedure Laterality Date   BACK SURGERY     CATARACT EXTRACTION W/PHACO Right 01/01/2019   Procedure: CATARACT EXTRACTION PHACO AND INTRAOCULAR LENS PLACEMENT ([place]) right vision blue;  Surgeon: Marchia Meiers, MD;  Location: [facility] ORS;  Service: Ophthalmology;  Laterality: Right;  [place] 00:39.4 CDE 5.49 Fluid Pack lot # [id number]   CATARACT EXTRACTION W/PHACO Left 01/29/2019   Procedure: CATARACT EXTRACTION PHACO AND INTRAOCULAR LENS PLACEMENT ([place]) LEFT Vision Blue;  Surgeon: Marchia Meiers, MD;  Location: [facility] ORS;  Service: Ophthalmology;  Laterality: Left;  [place] 00:51.1 CDE 4.38 Fluid Pack Lot # [id number]   COLONOSCOPY WITH PROPOFOL N/A 08/07/2017   Procedure: COLONOSCOPY WITH PROPOFOL;  Surgeon: Virgel Manifold, MD;  Location: [facility] ENDOSCOPY;  Service: Endoscopy;  Laterality: N/A;   EYE SURGERY  JOINT REPLACEMENT Left 2016   tkr   REPLACEMENT TOTAL KNEE Left 2016    MEDICATIONS:  (Not in a hospital admission)   ALLERGIES:   Allergies  Allergen Reactions   Chantix [Varenicline] Nausea Only   Glycopyrrolate Rash    Oral irritation    REVIEW OF SYSTEMS:   Negative except HPI  FAMILY HISTORY:   Family History  Problem Relation Age of Onset   Hernia Mother    Heart disease Mother    OCD Mother    Diabetes Mother    Parkinson's disease Father    Bipolar disorder Sister    Schizophrenia Sister    ADD / ADHD Sister     Alcohol abuse Brother    Bipolar disorder Sister    Paranoid behavior Sister    ADD / ADHD Sister    ADD / ADHD Son    Dementia Maternal Grandmother    Emphysema Maternal Grandfather    ADD / ADHD Son    ADD / ADHD Son    Depression Son     SOCIAL HISTORY:   reports that she has been smoking cigarettes. She has a 18.00 pack-year smoking history. She has been exposed to tobacco smoke. She has never used smokeless tobacco. She reports that she does not drink alcohol and does not use drugs.  PHYSICAL EXAM:  General appearance: alert, cooperative, and no distress Neck: no JVD and supple, symmetrical, trachea midline Resp: clear to auscultation bilaterally Cardio: regular rate and rhythm, S1, S2 normal, no murmur, click, rub or gallop GI: soft, non-tender; bowel sounds normal; no masses,  no organomegaly Extremities: extremities normal, atraumatic, no cyanosis or edema and Homans sign is negative, no sign of DVT Pulses: 2+ and symmetric Skin: Skin color, texture, turgor normal. No rashes or lesions Neurologic: Alert and oriented X 3, normal strength and tone. Normal symmetric reflexes. Normal coordination and gait    LABORATORY STUDIES: No results for input(s): "WBC", "HGB", "HCT", "PLT" in the last 72 hours.  No results for input(s): "NA", "K", "CL", "CO2", "GLUCOSE", "BUN", "CREATININE", "CALCIUM" in the last 72 hours.  STUDIES/RESULTS:  CT ABDOMEN PELVIS WO CONTRAST  Result Date: 03/13/2022 CLINICAL DATA:  Abdominal and flank pain.  Stone suspected. EXAM: CT ABDOMEN AND PELVIS WITHOUT CONTRAST TECHNIQUE: Multidetector CT imaging of the abdomen and pelvis was performed following the standard protocol without IV contrast. RADIATION DOSE REDUCTION: This exam was performed according to the departmental dose-optimization program which includes automated exposure control, adjustment of the mA and/or kV according to patient size and/or use of iterative reconstruction technique. COMPARISON:   05/14/2019 FINDINGS: Lower chest: Lung bases are clear. Hepatobiliary: Subcentimeter low-attenuation lesion in the anterior right lobe of the liver is unchanged since prior study, likely representing a small cyst or hemangioma. No new focal lesions. Gallbladder and bile ducts are unremarkable. Pancreas: Unremarkable. No pancreatic ductal dilatation or surrounding inflammatory changes. Spleen: Normal in size without focal abnormality. Adrenals/Urinary Tract: Adrenal glands are unremarkable. Kidneys are normal, without renal calculi, focal lesion, or hydronephrosis. Bladder is unremarkable. Stomach/Bowel: Stomach, small bowel, and colon are not abnormally distended. No wall thickening or inflammatory changes. Appendix is normal. Vascular/Lymphatic: Aortic atherosclerosis. No enlarged abdominal or pelvic lymph nodes. Reproductive: Uterus and bilateral adnexa are unremarkable. Other: No abdominal wall hernia or abnormality. No abdominopelvic ascites. Musculoskeletal: Scarring lateral to the right hip, likely postoperative. No change since prior study. Degenerative changes in the spine. Sclerosis in the superior aspects of both femoral heads consistent with avascular  necrosis. No significant progression since the previous study. IMPRESSION: 1. No acute process demonstrated in the abdomen or pelvis. No evidence of bowel obstruction or inflammation. 2. Aortic atherosclerosis. 3. Bone changes consistent with avascular necrosis of both hips. Electronically Signed   By: Lucienne Capers M.D.   On: 03/13/2022 21:48   MR HIP LEFT WO CONTRAST  Result Date: 03/13/2022 CLINICAL DATA:  Chronic left hip pain articular cartilage evaluation EXAM: MR OF THE LEFT HIP WITHOUT CONTRAST TECHNIQUE: Multiplanar, multisequence MR imaging was performed. No intravenous contrast was administered. COMPARISON:  CT examination dated February 23, 2022 and radiograph performed earlier on the same date. FINDINGS: Bone There is contour irregularity  of the femoral head with T2 hypointense and hyperintense lines and surrounding marrow edema which extends into the femoral neck consistent with osteonecrosis of the femoral head (AVN). There is near complete loss of the articular cartilage with multiple area of subchondral collapse as well as subchondral marrow edema of the acetabulum there is small joint effusion. There is also small area of subchondral necrosis of the right femoral head without evidence of surrounding edema, likely a subacute to chronic AVN of the right hip. SI joints are normal. No SI joint widening or erosive changes. Lower lumbar spine demonstrates no focal abnormality. Alignment Normal. No subluxation. Dysplasia None. Labrum Advanced degeneration/diminution of the labrum. Cartilage Near complete loss of the articular cartilage secondary to subacute to chronic AVN/osteoarthritis. Capsule and ligaments Normal. Muscles and Tendons Flexors: Normal. Extensors: Normal. Abductors: Normal. Adductors: Normal. Rotators: Normal. Hamstrings: Normal. Other Findings No bursal fluid. Viscera No abnormality seen in pelvis. No lymphadenopathy. No free fluid in the pelvis. IMPRESSION: 1. Osteonecrosis (AVN) of the left femoral head with surrounding marrow edema extending into the femoral neck. This is likely a subacute process. 2. Near complete loss of the articular cartilage with multiple area of subchondral collapse as well as subchondral marrow edema of the acetabulum. 3. Small left hip joint effusion, which is likely reactive secondary to AVN. Differential may include septic arthritis, however given bilateral findings most consistent with osteonecrosis. 4. Small area of subchondral necrosis of the right femoral head without evidence of surrounding edema, likely subacute to chronic AVN of the right hip. Electronically Signed   By: Keane Police D.O.   On: 03/13/2022 21:40   MR LUMBAR SPINE WO CONTRAST  Result Date: 03/13/2022 CLINICAL DATA:  Low back  pain, cauda equina syndrome suspected EXAM: MRI LUMBAR SPINE WITHOUT CONTRAST TECHNIQUE: Multiplanar, multisequence MR imaging of the lumbar spine was performed. No intravenous contrast was administered. COMPARISON:  None Available. FINDINGS: Segmentation:  Standard. Alignment:  No substantial sagittal subluxation. Vertebrae: No evidence of fracture, discitis, or suspicious bone lesion. Conus medullaris and cauda equina: Conus extends to the L2 level. Conus appears normal. Paraspinal and other soft tissues: Negative. Disc levels: T12-L1: Mild disc bulge without significant stenosis. L1-L2: No significant disc protrusion, foraminal stenosis, or canal stenosis. L2-L3: No significant disc protrusion, foraminal stenosis, or canal stenosis. L3-L4: Mild disc bulging without significant stenosis. L4-L5: Left eccentric disc bulge and facet arthropathy with mild bilateral foraminal stenosis. No significant canal stenosis. L5-S1: Facet arthropathy.  No significant stenosis. IMPRESSION: Similar degenerative changes (detailed above) without evidence of impingement. Electronically Signed   By: Margaretha Sheffield M.D.   On: 03/13/2022 21:34   DG HIP UNILAT WITH PELVIS 2-3 VIEWS LEFT  Result Date: 03/13/2022 CLINICAL DATA:  Hip pain EXAM: DG HIP (WITH OR WITHOUT PELVIS) 2-3V LEFT COMPARISON:  03/08/2022 FINDINGS:  Mild degenerative changes in the left hip with joint space narrowing and early spurring. Right hip maintained. SI joints symmetric and unremarkable. No acute bony abnormality. Specifically, no fracture, subluxation, or dislocation. IMPRESSION: No acute bony abnormality. Electronically Signed   By: Rolm Baptise M.D.   On: 03/13/2022 19:21   MR LUMBAR SPINE WO CONTRAST  Result Date: 03/08/2022 CLINICAL DATA:  Initial evaluation for low back pain, cauda equina syndrome suspected. EXAM: MRI LUMBAR SPINE WITHOUT CONTRAST TECHNIQUE: Multiplanar, multisequence MR imaging of the lumbar spine was performed. No intravenous  contrast was administered. COMPARISON:  CT from 02/23/2022. FINDINGS: Segmentation: Standard. Lowest well-formed disc space labeled the L5-S1 level. Alignment: Mild levoscoliosis. Alignment otherwise normal preservation of the normal lumbar lordosis. No listhesis. Vertebrae: Vertebral body height maintained without acute or chronic fracture. Bone marrow signal intensity within normal limits. No discrete or worrisome osseous lesions. No abnormal marrow edema. Conus medullaris and cauda equina: Conus extends to the L1-2 level. Conus and cauda equina appear normal. Paraspinal and other soft tissues: Unremarkable. Disc levels: T12-L1: Disc desiccation with mild disc bulge. Small chronic endplate Schmorl's node deformities. No spinal stenosis. Foramina remain patent. L1-2: Disc desiccation with minimal disc bulge. No spinal stenosis. Foramina remain patent. L2-3: Disc desiccation without significant disc bulge. Mild left greater than right facet hypertrophy. No spinal stenosis. Foramina remain patent. L3-4: Mild degenerative intervertebral disc space narrowing. Diffuse disc bulge with disc desiccation. Mild bilateral facet hypertrophy, slightly worse on the left. No spinal stenosis. Foramina remain patent. L4-5: Disc desiccation with mild disc bulge. Superimposed small central disc protrusion with annular fissure minimally indents the ventral thecal sac (series 8, image 28). Moderate bilateral facet arthrosis with associated small joint effusions. No significant spinal stenosis. Mild bilateral L4 foraminal narrowing. L5-S1: Disc desiccation without significant disc bulge. Advanced left worse than right facet arthrosis. No spinal stenosis. Foramina remain widely patent. IMPRESSION: 1. No acute abnormality within the lumbar spine. No evidence for cord compression or high-grade spinal stenosis. 2. Small central disc protrusion at L4-5 without significant stenosis or neural impingement. 3. Moderate to advanced lower lumbar  facet hypertrophy at L4-5 and L5-S1. Associated mild bilateral L4 foraminal stenosis. Electronically Signed   By: Jeannine Boga M.D.   On: 03/08/2022 21:23   DG HIP UNILAT WITH PELVIS 2-3 VIEWS LEFT  Result Date: 03/08/2022 CLINICAL DATA:  Left leg pain. EXAM: DG HIP (WITH OR WITHOUT PELVIS) 2-3V LEFT COMPARISON:  None Available. FINDINGS: There is no evidence of hip fracture or dislocation. Mild degenerative changes are seen in the form of joint space narrowing and acetabular sclerosis. IMPRESSION: Mild degenerative changes in the left hip. Electronically Signed   By: Virgina Norfolk M.D.   On: 03/08/2022 19:09   CT Lumbar Spine Wo Contrast  Result Date: 02/23/2022 CLINICAL DATA:  Lumbar radiculopathy, symptoms persist with > 6 wks treatment EXAM: CT LUMBAR SPINE WITHOUT CONTRAST TECHNIQUE: Multidetector CT imaging of the lumbar spine was performed without intravenous contrast administration. Multiplanar CT image reconstructions were also generated. RADIATION DOSE REDUCTION: This exam was performed according to the departmental dose-optimization program which includes automated exposure control, adjustment of the mA and/or kV according to patient size and/or use of iterative reconstruction technique. COMPARISON:  CT renal 05/14/2019 FINDINGS: Segmentation: 5 lumbar type vertebrae. Alignment: Normal. Vertebrae: No acute fracture or focal pathologic process. Paraspinal and other soft tissues: Negative. Disc levels: Maintained. Other: Atherosclerotic plaque. IMPRESSION: 1. None No acute displaced fracture or traumatic listhesis of the lumbar spine. 2.  Aortic Atherosclerosis (ICD10-I70.0). Electronically Signed   By: Iven Finn M.D.   On: 02/23/2022 18:11   CT FEMUR LEFT WO CONTRAST  Result Date: 02/23/2022 CLINICAL DATA:  Fracture, femur EXAM: CT OF THE LOWER LEFT EXTREMITY WITHOUT CONTRAST TECHNIQUE: Multidetector CT imaging of the lower left extremity was performed according to the  standard protocol. RADIATION DOSE REDUCTION: This exam was performed according to the departmental dose-optimization program which includes automated exposure control, adjustment of the mA and/or kV according to patient size and/or use of iterative reconstruction technique. COMPARISON:  None Available. FINDINGS: Bones/Joint/Cartilage Total left knee arthroplasty . No CT evidence of surgical hardware complication. No evidence of fracture, dislocation, or joint effusion. No evidence of severe arthropathy. No aggressive appearing focal bone abnormality. Limited evaluation along the knee joint due to streak artifact originating from the surgical hardware. Ligaments Suboptimally assessed by CT. Muscles and Tendons Grossly unremarkable grade Soft tissues Grossly unremarkable. IMPRESSION: 1. Total left knee arthroplasty. No CT evidence of surgical hardware complication. 2.  No acute displaced fracture or dislocation. 3. Limited evaluation along the knee joint due to streak artifact originating from the surgical hardware. Electronically Signed   By: Iven Finn M.D.   On: 02/23/2022 18:05    ASSESSMENT:  End stage osteoarthritis left hip        Active Problems:   * No active hospital problems. *    PLAN:  Left Primary Total Hip   Carlynn Spry 03/21/2022. 7:26 AM

## 2022-03-21 NOTE — Progress Notes (Addendum)
Annual Wellness Visit Virtual Telephone Visit  Patient location: home Provider location: [facility]  Patient: Nicole Austin, Female    DOB: Feb 11, 1961, 62 y.o.   MRN: [id number]  Subjective  Chief Complaint  Patient presents with   Medicare Wellness    Nicole Austin is a 62 y.o. female who presents today for her Annual Wellness Visit. She reports consuming a general diet. Exercise is limited by orthopedic condition(s): hip pain, respiratory condition(s): COPD. She generally feels poorly, b/l hip pain. She does not have additional problems to discuss today.   Vision:Within last year and Dental: No current dental problems and Receives regular dental care. Has full dentures. [facility], [facility]  Patient Active Problem List   Diagnosis Date Noted   AVN of femur ([place]) 03/21/2022   MRSA (methicillin resistant staph aureus) culture positive 05/04/2021   Hyperglycemia 01/01/2021   Prolonged QT interval 01/01/2021   Benign neoplasm of ascending colon    Polyp of sigmoid colon    Bipolar disorder with depression ([place]) 02/25/2017   ADD (attention deficit disorder) 02/25/2017   Marijuana use 02/13/2017   Neuropathy, peripheral 05/07/2016   Vitamin B12 deficiency 10/24/2015   Fibromyalgia 08/30/2015   Low HDL (under 40) 08/30/2015   GERD (gastroesophageal reflux disease) 08/30/2015   OP (osteoporosis) 06/30/2015   Sleep apnea 06/30/2015   Morbid (severe) obesity due to excess calories ([place]) 08/16/2014   Low back pain with sciatica 08/04/2014   Current tobacco use 08/04/2014   Polysubstance abuse ([place]) 07/04/2014   Psychoactive substance abuse ([place]) 07/04/2014   Anxiety, generalized 11/24/2013   COPD with asthma 11/18/2013   Arthritis of knee, degenerative 07/15/2013   Osteoarthritis of knee, unspecified 07/15/2013   Past Surgical History:  Procedure Laterality Date   BACK SURGERY     lumbar   CATARACT EXTRACTION W/PHACO Right 01/01/2019    Procedure: CATARACT EXTRACTION PHACO AND INTRAOCULAR LENS PLACEMENT ([place]) right vision blue;  Surgeon: Marchia Meiers, MD;  Location: [facility] ORS;  Service: Ophthalmology;  Laterality: Right;  [place] 00:39.4 CDE 5.49 Fluid Pack lot # [id number]   CATARACT EXTRACTION W/PHACO Left 01/29/2019   Procedure: CATARACT EXTRACTION PHACO AND INTRAOCULAR LENS PLACEMENT ([place]) LEFT Vision Blue;  Surgeon: Marchia Meiers, MD;  Location: [facility] ORS;  Service: Ophthalmology;  Laterality: Left;  [place] 00:51.1 CDE 4.38 Fluid Pack Lot # [id number]   COLONOSCOPY WITH PROPOFOL N/A 08/07/2017   Procedure: COLONOSCOPY WITH PROPOFOL;  Surgeon: Virgel Manifold, MD;  Location: [facility] ENDOSCOPY;  Service: Endoscopy;  Laterality: N/A;   HAND SURGERY Left    fractured with pins   REPLACEMENT TOTAL KNEE Left 2016   SPINAL CORD STIMULATOR INSERTION  2014   SPINAL CORD STIMULATOR REMOVAL  2014   TUBAL LIGATION     Social History   Tobacco Use   Smoking status: Former    Packs/day: 0.50    Years: 36.00    Total pack years: 18.00    Types: Cigarettes    Quit date: 03/13/2022    Years since quitting: 0.0    Passive exposure: Past   Smokeless tobacco: Never   Tobacco comments:    Pt using nicotine patches; 1/2 PPD as of 03/21/21  Vaping Use   Vaping Use: Never used  Substance Use Topics   Alcohol use: No    Alcohol/week: 0.0 standard drinks of alcohol   Drug use: Yes    Types: Marijuana      Medications: Outpatient Medications Prior to Visit  Medication  Sig   acetaminophen (TYLENOL) 500 MG tablet Take 2 tablets (1,000 mg total) by mouth every 8 (eight) hours as needed.   albuterol (VENTOLIN HFA) 108 (90 Base) MCG/ACT inhaler Inhale 2 puffs into the lungs every 4 (four) hours as needed for wheezing or shortness of breath.   ALPRAZolam (XANAX) 0.5 MG tablet Take 0.5 mg by mouth 4 (four) times daily.   atorvastatin (LIPITOR) 40 MG tablet TAKE 1 TABLET BY MOUTH EVERY NIGHT AT BEDTIME   dicyclomine (BENTYL) 20 MG tablet Take 1  tablet (20 mg total) by mouth 3 (three) times daily as needed for spasms (abd cramping).   divalproex (DEPAKOTE) 250 MG DR tablet Take 1 tablet (250 mg total) by mouth 2 (two) times daily.   escitalopram (LEXAPRO) 20 MG tablet Take 1 tablet (20 mg total) by mouth daily.   famotidine (PEPCID) 20 MG tablet TAKE 1 TABLET BY MOUTH 2 TIMES DAILY AS NEEDED FOR NAUSEA/INDIGESTION/STOMACH UPSET.   ipratropium-albuterol (DUONEB) 0.5-2.5 (3) MG/3ML SOLN Take 3 mLs by nebulization 3 (three) times daily as needed.   lurasidone (LATUDA) 80 MG TABS tablet Take 1 tablet (80 mg total) by mouth daily with breakfast.   mometasone-formoterol (DULERA) 100-5 MCG/ACT AERO Inhale 2 puffs into the lungs in the morning and at bedtime.   Multiple Vitamin (MULTIVITAMIN WITH MINERALS) TABS tablet Take 1 tablet by mouth daily.   nystatin (MYCOSTATIN/NYSTOP) powder Apply 1 application. topically 3 (three) times daily as needed (rash in skin folds).   nystatin ointment (MYCOSTATIN) Apply 1 application topically 2 (two) times daily.   nystatin-triamcinolone ointment (MYCOLOG) Apply 1 Application topically 2 (two) times daily.   OXYGEN Inhale 2 L into the lungs as needed.   pantoprazole (PROTONIX) 40 MG tablet TAKE 1 TABLET BY MOUTH ONCE A DAY (Patient taking differently: Take 40 mg by mouth daily as needed.)   promethazine (PHENERGAN) 25 MG tablet Take 25 mg by mouth 2 (two) times daily as needed.   rOPINIRole (REQUIP XL) 4 MG 24 hr tablet Take 4 mg by mouth at bedtime.   Tiotropium Bromide Monohydrate (SPIRIVA RESPIMAT) 2.5 MCG/ACT AERS Inhale 2 puffs into the lungs daily.   tiZANidine (ZANAFLEX) 4 MG tablet Take 1 tablet (4 mg total) by mouth 3 (three) times daily as needed for muscle spasms.   traMADol (ULTRAM) 50 MG tablet Take 50 mg by mouth every 6 (six) hours as needed.   No facility-administered medications prior to visit.    Allergies  Allergen Reactions   Chantix [Varenicline] Nausea Only   Glycopyrrolate Rash     Oral irritation    Patient Care Team: Delsa Grana, PA-C as PCP - General (Family Medicine) Myer Haff, MD as Referring Physician (Psychiatry) Hambright, Stephani Police, LCSW as Social Worker (Professional Counselor) Lovell Sheehan, MD as Consulting Physician (Orthopedic Surgery) Tyler Pita, MD as Consulting Physician (Pulmonary Disease) Vladimir Crofts, MD as Consulting Physician (Neurology)      Objective  LMP  (LMP Unknown)  BP Readings from Last 3 Encounters:  03/21/22 113/75  03/14/22 126/68  03/08/22 (!) 148/70   Wt Readings from Last 3 Encounters:  03/21/22 229 lb 15 oz (104.3 kg)  03/13/22 229 lb 15 oz (104.3 kg)  03/08/22 230 lb (104.3 kg)      Physical Exam Entirety of visit conducted over the phone. Speaks in full sentences, no respiratory distress.   Most recent functional status assessment:    03/21/2022   11:34 AM  In your present state of  health, do you have any difficulty performing the following activities:  Hearing? 0  Vision? 0  Difficulty concentrating or making decisions? 0  Walking or climbing stairs? 1  Dressing or bathing? 0  Doing errands, shopping? 0   Most recent fall risk assessment:    03/21/2022   11:34 AM  Fall Risk   Falls in the past year? 1  Number falls in past yr: 0  Injury with Fall? 0  Risk for fall due to : Impaired balance/gait  Follow up Falls prevention discussed;Education provided;Falls evaluation completed    Most recent depression screenings:    03/21/2022   11:34 AM 02/27/2022    2:55 PM  PHQ 2/9 Scores  PHQ - 2 Score 0 0  PHQ- 9 Score 0 0   Most recent cognitive screening:    03/21/2022   11:34 AM  6CIT Screen  What Year? 0 points  What month? 0 points  What time? 3 points  Count back from 20 0 points  Months in reverse 4 points  Repeat phrase 0 points  Total Score 7 points   Most recent Audit-C alcohol use screening    03/21/2022   11:34 AM  Alcohol Use Disorder Test (AUDIT)  1. How often  do you have a drink containing alcohol? 0  2. How many drinks containing alcohol do you have on a typical day when you are drinking? 0  3. How often do you have six or more drinks on one occasion? 0  AUDIT-C Score 0   A score of 3 or more in women, and 4 or more in men indicates increased risk for alcohol abuse, EXCEPT if all of the points are from question 1   Vision/Hearing Screen: No results found.  Last CBC Lab Results  Component Value Date   WBC 8.9 03/13/2022   HGB 12.9 03/13/2022   HCT 39.3 03/13/2022   MCV 89.1 03/13/2022   MCH 29.3 03/13/2022   RDW 14.7 03/13/2022   PLT 250 58/30/9407   Last metabolic panel Lab Results  Component Value Date   GLUCOSE 89 03/13/2022   NA 129 (L) 03/13/2022   K 4.2 03/13/2022   CL 96 (L) 03/13/2022   CO2 26 03/13/2022   BUN 10 03/13/2022   CREATININE 0.90 03/13/2022   GFRNONAA >60 03/13/2022   CALCIUM 9.1 03/13/2022   PHOS 3.0 06/14/2014   PROT 6.4 (L) 03/08/2022   ALBUMIN 3.5 03/08/2022   BILITOT 0.7 03/08/2022   ALKPHOS 57 03/08/2022   AST 16 03/08/2022   ALT 15 03/08/2022   ANIONGAP 7 03/13/2022   Last lipids Lab Results  Component Value Date   CHOL 117 12/07/2019   HDL 31 (L) 12/07/2019   LDLCALC 58 12/07/2019   TRIG 216 (H) 12/07/2019   CHOLHDL 3.8 12/07/2019   Last hemoglobin A1c Lab Results  Component Value Date   HGBA1C 5.8 (H) 01/03/2021      Results for orders placed or performed during the hospital encounter of 03/21/22  Type and screen [place]  Result Value Ref Range   ABO/RH(D) O POS    Antibody Screen NEG    Sample Expiration 04/04/2022,2359    Extend sample reason      NO TRANSFUSIONS OR PREGNANCY IN THE PAST 3 MONTHS Performed at [facility], [address] [place], [place] [address]       Assessment & Plan   Annual wellness visit done today including the all of the following: Reviewed patient's  Family Medical History Reviewed and updated list of  patient's medical providers Assessment of cognitive impairment was done Assessed patient's functional ability Established a written schedule for health screening [place] Completed and Reviewed  Exercise Activities and Dietary recommendations  Goals      DIET - INCREASE WATER INTAKE     Recommend increasing water to 6-8 glasses per day     Eat more fruits and vegetables     Increase physical activity     Quit Smoking     If you wish to quit smoking, help is available. For free tobacco cessation program offerings call the [facility] at ([phone] [phone] or Live Well Line at [phone]. You may also visit [email or web address] or email [email or web address] for more information on other programs.       Track and Manage My Triggers-COPD     Timeframe:  Long-Range Goal Priority:  High Start Date: 04/20/2020                            Expected End Date: 10/19/2020                      Follow Up Date 06/25/2020    - eliminate smoking in my home - identify and remove indoor air pollutants - limit outdoor activity during cold weather    Why is this important?   Triggers are activities or things, like tobacco smoke or cold weather, that make your COPD (chronic obstructive pulmonary disease) flare-up.  Knowing these triggers helps you plan how to stay away from them.  When you cannot remove them, you can learn how to manage them.     Notes:         Immunization History  Administered Date(s) Administered   Influenza Split 12/15/2012   Influenza,inj,Quad PF,6+ Mos 10/27/2013, 12/22/2014, 12/13/2015, 10/16/2016, 05/02/2018, 12/20/2020, 11/13/2021   Influenza-Unspecified 11/07/2011, 12/15/2012   Moderna Sars-Covid-2 Vaccination 06/03/2019, 07/15/2019, 03/08/2020   Pneumococcal Polysaccharide-23 06/21/2015   Tdap 12/13/2015   Zoster Recombinat (Shingrix) 07/25/2018, 10/08/2018    Health Maintenance  Topic Date Due   MAMMOGRAM  Never done    COLONOSCOPY (Pts 45-75yr Insurance coverage will need to be confirmed)  08/08/2018   COVID-19 Vaccine (4 - 2023-24 season) 10/27/2021   Medicare Annual Wellness (AWV)  03/21/2022   PAP SMEAR-Modifier  05/02/2023   DTaP/Tdap/Td (2 - Td or Tdap) 12/12/2025   INFLUENZA VACCINE  Completed   Hepatitis C Screening  Completed   HIV Screening  Completed   Zoster Vaccines- Shingrix  Completed   HPV VACCINES  Aged Out   Advanced Care Planning: A voluntary discussion about advance care planning including the explanation and discussion of advance directives.  Discussed health care proxy and Living will, and the patient was able to identify a health care proxy as MLisbeth Renshaw  Patient does not have a living will at present time. If patient does have living will, I have requested they bring this to the clinic to be scanned in to their chart.   Discussed health benefits of physical activity, and encouraged her to engage in regular exercise appropriate for her age and condition.    Problem List Items Addressed This Visit       Respiratory   COPD with asthma     Digestive   Polyp of sigmoid colon     Musculoskeletal and Integument   [place] of [place]  Other   Current tobacco use   Morbid (severe) obesity due to excess calories [facility])   Other Visit Diagnoses     Medicare annual wellness visit, subsequent    -  Primary       Return in about 2 months (around 05/20/2022) for chronic disease f/u with PCP.     Myles Gip, DO

## 2022-03-21 NOTE — Patient Instructions (Signed)
Your procedure is scheduled on: Tuesday, January 30 Report to the Registration Desk on the 1st floor of the [facility]. To find out your arrival time, please call [phone] between 1PM - 3PM on: Monday, January 29 If your arrival time is 6:00 am, do not arrive prior to that time as the [facility] entrance doors do not open until 6:00 am.  REMEMBER: Instructions that are not followed completely may result in serious medical risk, up to and including death; or upon the discretion of your surgeon and anesthesiologist your surgery may need to be rescheduled.  Do not eat or drinkafter midnight the night before surgery.  No gum chewing, lozengers or hard candies.  TAKE THESE MEDICATIONS THE MORNING OF SURGERY WITH A SIP OF WATER:  Alprazolam (Xanax) Divalproex (Depakote) Escitalopram (Lexapro) Famotidine (Pepcid) Ipratropium-albuterol (Duoneb) nebulizer Lurasidone (Latuda) Dulera inhaler Spiriva inhaler  One week prior to surgery: starting today, January 24 Stop Anti-inflammatories (NSAIDS) such as Advil, Aleve, Ibuprofen, Motrin, Naproxen, Naprosyn and Aspirin based products such as Excedrin, Goodys Powder, BC Powder. Stop ANY OVER THE COUNTER supplements until after surgery. You may however, continue to take Tylenol if needed for pain up until the day of surgery.  No Alcohol for 24 hours before or after surgery.  No Smoking including e-cigarettes for 24 hours prior to surgery.  No chewable tobacco products for at least 6 hours prior to surgery.  No nicotine patches on the day of surgery.  Do not use any "recreational" drugs for at least a week prior to your surgery.  Please be advised that the combination of cocaine and anesthesia may have negative outcomes, up to and including death. If you test positive for cocaine, your surgery will be cancelled.  On the morning of surgery brush your teeth with toothpaste and water, you may rinse your mouth with mouthwash if you wish. Do  not swallow any toothpaste or mouthwash.  Use CHG wipes as directed on instruction sheet.  Do not wear jewelry, make-up, hairpins, clips or nail polish.  Do not wear lotions, powders, or perfumes.   Do not shave body from the neck down 48 hours prior to surgery just in case you cut yourself which could leave a site for infection.  Also, freshly shaved skin may become irritated if using the CHG soap.  Contact lenses, hearing aids and dentures may not be worn into surgery.  Do not bring valuables to the hospital. [facility] is not responsible for any missing/lost belongings or valuables.   Notify your doctor if there is any change in your medical condition (cold, fever, infection).  Wear comfortable clothing (specific to your surgery type) to the hospital.  After surgery, you can help prevent lung complications by doing breathing exercises.  Take deep breaths and cough every 1-2 hours. Your doctor may order a device called an Incentive Spirometer to help you take deep breaths.  If you are being admitted to the hospital overnight, leave your suitcase in the car. After surgery it may be brought to your room.  If you are being discharged the day of surgery, you will not be allowed to drive home. You will need a responsible adult (18 years or older) to drive you home and stay with you that night.   If you are taking public transportation, you will need to have a responsible adult (18 years or older) with you. Please confirm with your physician that it is acceptable to use public transportation.   Please call the  Pre-admissions Testing Dept. at [phone] if you have any questions about these instructions.  Surgery Visitation Policy:  Patients undergoing a surgery or procedure may have two family members or support persons with them as long as the person is not COVID-19 positive or experiencing its symptoms.   Inpatient Visitation:    Visiting hours are 7 a.m. to 8 p.m. Up to  four visitors are allowed at one time in a patient room. The visitors may rotate out with other people during the day. One designated support person (adult) may remain overnight.  Due to an increase in RSV and influenza rates and associated hospitalizations, children ages 29 and under will not be able to visit patients in [facility]. Masks continue to be strongly recommended.  Preparing the Skin Before Surgery     To help prevent the risk of infection at your surgical site, we are now providing you with rinse-free Sage 2% Chlorhexidine Gluconate (CHG) disposable wipes.  Chlorhexidine Gluconate (CHG) Soap  o An antiseptic cleaner that kills germs and bonds with the skin to continue killing germs even after washing  o Used for showering the night before surgery and morning of surgery  The night before surgery: Shower or bathe with warm water. Do not apply perfume, lotions, powders. Wait one hour after shower. Skin should be dry and cool. Open Sage wipe package - 6 disposable cloths are inside. Wipe body using one cloth for the right arm, one cloth for the left arm, one cloth for the right leg, one cloth for the left leg, one cloth for the chest/abdomen area (do not use on breasts if breast feeding), and one cloth for the back. 5. Do not rinse, allow to dry. 6. Skin may fee "tacky" for several minutes. 7. Dress in clean clothes. 8. Place clean sheets on your bed and do not sleep with pets.  REPEAT ABOVE ON THE MORNING OF SURGERY PRIOR TO ARRIVING TO [place].

## 2022-03-21 NOTE — Progress Notes (Signed)
  Perioperative Services  Abnormal Lab Notification and Treatment Plan of Care  Date: 03/21/22  Name: Nicole Austin MRN:   [id number]  Re: Abnormal labs noted during PAT appointment  Provider Notified: Lovell Sheehan, MD Notification mode: Routed and/or faxed via [place] of concern: Lab Results  Component Value Date   STAPHAUREUS POSITIVE (A) 03/21/2022   MRSAPCR POSITIVE (A) 03/21/2022    Notes: Patient is scheduled for a TOTAL HIP ARTHROPLASTY ANTERIOR APPROACH (Left: Hip) on 03/27/2022. She is scheduled to receive CEFAZOLIN pre-operatively. Surgical PCR (+) for MRSA; see above.  PLANS:  Review renal function. Estimated Creatinine Clearance: 72 mL/min (by C-G formula based on SCr of 0.9 mg/dL). Review allergies. No documented allergy to vancomycin. Order added for VANCOMYCIN 1 GRAM IV to current preoperative prophylactic regimen.  Patient with orders for both CEFAZOLIN + VANCOMYCIN to be given in the setting of documented MRSA (+) surgical PCR.   Guidelines suggest that a beta-lactam antibiotic (first or second generation cephalosporin) should be added for activity against gram-negative organisms.  Vancomycin appears to be less effective than cefazolin for preventing SSIs caused by MSSA. For this reason, the use of vancomycin in combination with cefazolin is favored for prevention of SSI due to MRSA and coagulase-negative staphylococci.  Honor Loh, MSN, APRN, FNP-C, CEN [facility]  Peri-operative Services Nurse Practitioner Phone: [phone] 03/21/22 2:19 PM

## 2022-03-22 ENCOUNTER — Ambulatory Visit: Payer: 59

## 2022-03-27 ENCOUNTER — Other Ambulatory Visit: Payer: Self-pay

## 2022-03-27 ENCOUNTER — Ambulatory Visit
Admission: RE | Admit: 2022-03-27 | Discharge: 2022-03-27 | Disposition: A | Discharge status: Home / Self Care | Payer: 59 | Source: Ambulatory Visit | Attending: Orthopedic Surgery | Admitting: Orthopedic Surgery

## 2022-03-27 ENCOUNTER — Encounter
Admission: RE | Disposition: A | Discharge status: Home / Self Care | Payer: Self-pay | Source: Ambulatory Visit | Attending: Orthopedic Surgery

## 2022-03-27 ENCOUNTER — Encounter: Payer: Self-pay | Admitting: Orthopedic Surgery

## 2022-03-27 ENCOUNTER — Ambulatory Visit: Payer: Self-pay

## 2022-03-27 ENCOUNTER — Ambulatory Visit: Payer: 59 | Admitting: Neurosurgery

## 2022-03-27 DIAGNOSIS — Z539 Procedure and treatment not carried out, unspecified reason: Secondary | ICD-10-CM | POA: Insufficient documentation

## 2022-03-27 LAB — ABO/RH: ABO/RH(D): O POS

## 2022-03-27 SURGERY — ARTHROPLASTY, HIP, TOTAL, ANTERIOR APPROACH
Anesthesia: General | Site: Hip | Laterality: Left

## 2022-03-27 MED ORDER — BUPIVACAINE HCL (PF) 0.5 % IJ SOLN
INTRAMUSCULAR | Status: AC
  Filled 2022-03-27: qty 10

## 2022-03-27 MED ORDER — POVIDONE-IODINE 10 % EX SWAB: 2.0000 | Freq: Once | CUTANEOUS | Status: DC

## 2022-03-27 MED ORDER — VANCOMYCIN HCL IN DEXTROSE 1-5 GM/200ML-% IV SOLN: 1000.0000 mg | Freq: Once | INTRAVENOUS | Status: DC

## 2022-03-27 MED ORDER — PHENYLEPHRINE HCL-NACL 20-0.9 MG/250ML-% IV SOLN
INTRAVENOUS | Status: AC
  Filled 2022-03-27: qty 250

## 2022-03-27 MED ORDER — FENTANYL CITRATE (PF) 100 MCG/2ML IJ SOLN
INTRAMUSCULAR | Status: AC
  Filled 2022-03-27: qty 2

## 2022-03-27 MED ORDER — VANCOMYCIN HCL IN DEXTROSE 1-5 GM/200ML-% IV SOLN
INTRAVENOUS | Status: AC
  Filled 2022-03-27: qty 200

## 2022-03-27 MED ORDER — PROPOFOL 1000 MG/100ML IV EMUL
INTRAVENOUS | Status: AC
  Filled 2022-03-27: qty 100

## 2022-03-27 MED ORDER — CHLORHEXIDINE GLUCONATE 0.12 % MT SOLN
OROMUCOSAL | Status: AC
  Administered 2022-03-27: 15 mL via OROMUCOSAL
  Filled 2022-03-27: qty 15

## 2022-03-27 MED ORDER — PROPOFOL 10 MG/ML IV BOLUS
INTRAVENOUS | Status: AC
  Filled 2022-03-27: qty 20

## 2022-03-27 MED ORDER — CEFAZOLIN SODIUM-DEXTROSE 2-4 GM/100ML-% IV SOLN
INTRAVENOUS | Status: AC
  Filled 2022-03-27: qty 100

## 2022-03-27 MED ORDER — CHLORHEXIDINE GLUCONATE 0.12 % MT SOLN: 15.0000 mL | Freq: Once | OROMUCOSAL | Status: AC

## 2022-03-27 MED ORDER — LACTATED RINGERS IV SOLN: INTRAVENOUS | Status: DC

## 2022-03-27 MED ORDER — TRANEXAMIC ACID-NACL 1000-0.7 MG/100ML-% IV SOLN: 1000.0000 mg | INTRAVENOUS | Status: DC

## 2022-03-27 MED ORDER — VANCOMYCIN HCL 10 G IV SOLR
1500.0000 mg | Freq: Once | INTRAVENOUS | Status: DC
  Filled 2022-03-27: qty 15

## 2022-03-27 MED ORDER — VANCOMYCIN HCL 500 MG/100ML IV SOLN
500.0000 mg | Freq: Once | INTRAVENOUS | Status: DC
  Filled 2022-03-27: qty 100

## 2022-03-27 MED ORDER — MIDAZOLAM HCL 2 MG/2ML IJ SOLN
INTRAMUSCULAR | Status: AC
  Filled 2022-03-27: qty 2

## 2022-03-27 MED ORDER — ORAL CARE MOUTH RINSE: 15.0000 mL | Freq: Once | OROMUCOSAL | Status: AC

## 2022-03-27 MED ORDER — TRANEXAMIC ACID-NACL 1000-0.7 MG/100ML-% IV SOLN
INTRAVENOUS | Status: AC
  Filled 2022-03-27: qty 100

## 2022-03-27 MED ORDER — CEFAZOLIN SODIUM-DEXTROSE 2-4 GM/100ML-% IV SOLN: 2.0000 g | INTRAVENOUS | Status: DC

## 2022-03-27 SURGICAL SUPPLY — 48 items
BLADE SAGITTAL WIDE XTHICK NO (BLADE) ×1 IMPLANT
BNDG COHESIVE 4X5 TAN STRL LF (GAUZE/BANDAGES/DRESSINGS) IMPLANT
BRUSH SCRUB EZ  4% CHG (MISCELLANEOUS) ×1
BRUSH SCRUB EZ 4% CHG (MISCELLANEOUS) ×1 IMPLANT
CHLORAPREP W/TINT 26 (MISCELLANEOUS) ×2 IMPLANT
DRAPE 3/4 80X56 (DRAPES) ×1 IMPLANT
DRAPE C-ARM 42X72 X-RAY (DRAPES) ×1 IMPLANT
DRAPE STERI IOBAN 125X83 (DRAPES) IMPLANT
DRAPE U-SHAPE 47X51 STRL (DRAPES) ×1 IMPLANT
DRSG AQUACEL AG ADV 3.5X10 (GAUZE/BANDAGES/DRESSINGS) IMPLANT
DRSG AQUACEL AG ADV 3.5X14 (GAUZE/BANDAGES/DRESSINGS) IMPLANT
ELECT REM PT RETURN 9FT ADLT (ELECTROSURGICAL) ×1
ELECTRODE REM PT RTRN 9FT ADLT (ELECTROSURGICAL) ×1 IMPLANT
GAUZE 4X4 16PLY ~~LOC~~+RFID DBL (SPONGE) ×1 IMPLANT
GAUZE XEROFORM 1X8 LF (GAUZE/BANDAGES/DRESSINGS) IMPLANT
GLOVE BIO SURGEON STRL SZ8 (GLOVE) ×2 IMPLANT
GLOVE BIOGEL PI IND STRL 8.5 (GLOVE) ×2 IMPLANT
GLOVE PI ORTHO PRO STRL SZ8 (GLOVE) ×2 IMPLANT
GOWN STRL REUS W/ TWL XL LVL3 (GOWN DISPOSABLE) ×2 IMPLANT
GOWN STRL REUS W/TWL XL LVL3 (GOWN DISPOSABLE) ×2
HOOD PEEL AWAY T7 (MISCELLANEOUS) ×3 IMPLANT
IV NS 250ML (IV SOLUTION)
IV NS 250ML BAXH (IV SOLUTION) IMPLANT
IV NS IRRIG 3000ML ARTHROMATIC (IV SOLUTION) ×1 IMPLANT
KIT PATIENT CARE HANA TABLE (KITS) ×1 IMPLANT
KIT TURNOVER CYSTO (KITS) ×1 IMPLANT
MANIFOLD NEPTUNE II (INSTRUMENTS) ×1 IMPLANT
MAT ABSORB  FLUID 56X50 GRAY (MISCELLANEOUS) ×1
MAT ABSORB FLUID 56X50 GRAY (MISCELLANEOUS) ×1 IMPLANT
NDL SAFETY ECLIP 18X1.5 (MISCELLANEOUS) IMPLANT
NEEDLE SPNL 20GX3.5 QUINCKE YW (NEEDLE) ×1 IMPLANT
PACK HIP PROSTHESIS (MISCELLANEOUS) ×1 IMPLANT
PADDING CAST BLEND 4X4 NS (MISCELLANEOUS) ×2 IMPLANT
PENCIL SMOKE EVACUATOR (MISCELLANEOUS) ×1 IMPLANT
PILLOW ABDUCTION MEDIUM (MISCELLANEOUS) ×1 IMPLANT
PULSAVAC PLUS IRRIG FAN TIP (DISPOSABLE) ×1
SOLUTION IRRIG SURGIPHOR (IV SOLUTION) ×1 IMPLANT
SPONGE T-LAP 18X18 ~~LOC~~+RFID (SPONGE) ×2 IMPLANT
STAPLER SKIN PROX 35W (STAPLE) ×1 IMPLANT
SUT BONE WAX W31G (SUTURE) ×1 IMPLANT
SUT DVC 2 QUILL PDO  T11 36X36 (SUTURE) ×1
SUT DVC 2 QUILL PDO T11 36X36 (SUTURE) ×1 IMPLANT
SUT VIC AB 2-0 CT1 18 (SUTURE) ×1 IMPLANT
SYR 30ML LL (SYRINGE) ×1 IMPLANT
TIP FAN IRRIG PULSAVAC PLUS (DISPOSABLE) ×1 IMPLANT
TRAP FLUID SMOKE EVACUATOR (MISCELLANEOUS) ×1 IMPLANT
WAND WEREWOLF FASTSEAL 6.0 (MISCELLANEOUS) ×1 IMPLANT
WATER STERILE IRR 500ML POUR (IV SOLUTION) ×1 IMPLANT

## 2022-03-27 NOTE — Telephone Encounter (Signed)
Summary: cold sores on lip   Patient called in has cold sores on lips. She  doesn't want an appt. She is asking for baclofen to be sent to the pharmacy      Called pt - [facility]

## 2022-03-27 NOTE — Telephone Encounter (Signed)
  Chief Complaint: cold sore and skin splits and corners of mouth Symptoms: painful to eat Frequency: 3 days Pertinent Negatives: Patient denies fever Disposition: '[]'$ ED /'[]'$ Urgent Care (no appt availability in office) / '[]'$ Appointment(In office/virtual)/ '[]'$  [facility]/ '[]'$ Home Care/ '[]'$ Refused Recommended Disposition /'[]'$ [place]/ '[]'$  Follow-up with PCP Additional Notes: Patient is requesting medication- advised she will need appointment-  patient has been scheduled for tomorrow am

## 2022-03-27 NOTE — Telephone Encounter (Signed)
Per Junie Panning- she will need to be seen .

## 2022-03-27 NOTE — Telephone Encounter (Signed)
Pt last seen 1.2.2024

## 2022-03-27 NOTE — Telephone Encounter (Signed)
Patient called, left VM to return the call to the office to discuss symptoms with a nurse.  Summary: cold sores on lip   Patient called in has cold sores on lips. She  doesn't want an appt. She is asking for baclofen to be sent to the pharmacy

## 2022-03-27 NOTE — Telephone Encounter (Signed)
Reason for Disposition . Sores last > 2 weeks    Sores for 3 days - very painful- patient requesting medication  Answer Assessment - Initial Assessment Questions 1. APPEARANCE of BLISTERS: "Describe the sores."     Crease/corners of lips- top lip to the left- dry red 2. SIZE: "How large an area is involved with the cold sores?" (e.g., inches, cm or compare to coins)     No sores- splits in the skin of the corner of the mouth 3. LOCATION: "Which part of the lip is involved?"     Bilateral- but more to the left 4. ONSET: "When did the fever blisters begin?"     Started as small bump- today is day 3 5. RECURRENT BLISTERS: "Have you had fever blisters before?" If Yes, ask: "When was the last time?" "How many times a year?"     Hx fever blister hx- depends on how much patient dust and cleans 6. OTHER SYMPTOMS: "Do you have any other symptoms?" (e.g., fever, sores inside mouth)     Pain Using medicated Blistex- not helping.  Protocols used: Cold Sores (Fever Blisters)-A-AH

## 2022-03-27 NOTE — Telephone Encounter (Signed)
Summary: cold sores on lip   Pt called back and stated she will have to call back to see when she can get someone to bring her to the office and for a wheel chair / she is unable to come in at this time for the cold sore medication /please advise  ----- Message from Bayard Beaver sent at 03/27/2022 12:21 PM EST ----- Patient called in has cold sores on lips. She  doesn't want an appt. She is asking for baclofen to be sent to the pharmacy      Called pt left message to return call.

## 2022-03-27 NOTE — Telephone Encounter (Signed)
Lvm for pt to return call to schedule appt

## 2022-03-27 NOTE — Progress Notes (Signed)
Surgery cancelled due to patient taking dose of Xarelto am of surgery. Pt verbalizes understanding. Surgery instructions given to patient and sister.

## 2022-03-27 NOTE — H&P (Signed)
Patient took Xarelto this morning and will need to be rescheduled for surgery.

## 2022-03-27 NOTE — Telephone Encounter (Signed)
Summary: cold sores on lip   Patient called in has cold sores on lips. She  doesn't want an appt. She is asking for baclofen to be sent to the pharmacy      Called PT [email or web address].

## 2022-03-27 NOTE — Anesthesia Preprocedure Evaluation (Signed)
Anesthesia Evaluation  Patient identified by MRN, date of birth, ID band Patient awake    Reviewed: Allergy & Precautions, NPO status , Patient's Chart, lab work & pertinent test results  History of Anesthesia Complications Negative for: history of anesthetic complications  Airway Mallampati: III  TM Distance: <3 FB Neck ROM: full    Dental  (+) Upper Dentures, Lower Dentures   Pulmonary asthma , sleep apnea , COPD,  COPD inhaler and oxygen dependent, Patient abstained from smoking., former smoker   Pulmonary exam normal        Cardiovascular (-) angina + Past MI  Normal cardiovascular exam     Neuro/Psych  Neuromuscular disease CVA, Residual Symptoms  negative psych ROS   GI/Hepatic Neg liver ROS,GERD  Controlled,,  Endo/Other  negative endocrine ROS    Renal/GU      Musculoskeletal   Abdominal   Peds  Hematology negative hematology ROS (+)   Anesthesia Other Findings Past Medical History: No date: ADHD (attention deficit hyperactivity disorder) 06/30/2015: Apnea, sleep No date: Arthritis No date: Asthma 06/30/2015: Asthma with acute exacerbation No date: Bipolar 1 disorder (HCC) No date: Chronic pain 11/18/2013: COPD, moderate (HCC) No date: Depression No date: DOE (dyspnea on exertion) 08/30/2015: GERD (gastroesophageal reflux disease) 08/16/2014: Gout 06/30/2015: History of acute myocardial infarction No date: History of panic attacks 07/17/2013: HPV (human papilloma virus) infection No date: Hyperlipidemia 08/30/2015: Low HDL (under 40) 2018: MI (myocardial infarction) ([place]) 2023: MRSA infection     Comment:  groin abscess 03/21/2022: Nose colonized with MRSA     Comment:  a.) PCR (+) prior to LEFT THA No date: Osteoporosis No date: Parkinson disease No date: Pre-diabetes 05/20/2016: Prediabetes No date: PTSD (post-traumatic stress disorder) No date: Restless leg syndrome No date:  Rhabdomyolysis 06/30/2015: Sleep apnea 11/18/2013: Stage 3 severe COPD by GOLD classification ([place]) 2018: Stroke ([place])     Comment:  right arm weakness No date: Tremors of nervous system     Comment:  hands 06/30/2015: Uncomplicated asthma 69/62/9528: Varicella  Past Surgical History: No date: BACK SURGERY     Comment:  lumbar 01/01/2019: CATARACT EXTRACTION W/PHACO; Right     Comment:  Procedure: CATARACT EXTRACTION PHACO AND INTRAOCULAR               LENS PLACEMENT (IOC) right vision blue;  Surgeon: Marchia Meiers, MD;  Location: [facility] ORS;  Service: Ophthalmology;               Laterality: Right;  [place] 00:39.4 CDE 5.49 Fluid Pack lot               # [id number] 01/29/2019: CATARACT EXTRACTION W/PHACO; Left     Comment:  Procedure: CATARACT EXTRACTION PHACO AND INTRAOCULAR               LENS PLACEMENT ([place]) LEFT Vision Blue;  Surgeon: Marchia Meiers, MD;  Location: [facility] OR[facility];  Service: Ophthalmology;               Laterality: Left;  [place] 00:51.1 CDE 4.38 Fluid Pack Lot #              [id number] 08/07/2017: COLONOSCOPY WITH PROPOFOL; N/A     Comment:  Procedure: COLONOSCOPY WITH PROPOFOL;  Surgeon:               Virgel Manifold,  MD;  Location: [facility] END[facility];                Service: Endoscopy;  Laterality: N/A; No date: HAND SURGERY; Left     Comment:  fractured with pins 2016: REPLACEMENT TOTAL KNEE; Left 2014: SPINAL CORD STIMULATOR INSERTION 2014: SPINAL CORD STIMULATOR REMOVAL No date: TUBAL LIGATION  BMI    Body Mass Index: 43.45 kg/m      Reproductive/Obstetrics negative OB ROS                             Anesthesia Physical Anesthesia Plan  ASA: 3  Anesthesia Plan: General ETT   Post-op Pain Management:    Induction: Intravenous  PONV Risk Score and Plan: Ondansetron, Dexamethasone, Midazolam and Treatment may vary due to age or medical condition  Airway Management Planned: Oral ETT  Additional  Equipment:   Intra-op Plan:   Post-operative Plan: Extubation in OR and Possible Post-op intubation/ventilation  Informed Consent: I have reviewed the patients History and Physical, chart, labs and discussed the procedure including the risks, benefits and alternatives for the proposed anesthesia with the patient or authorized representative who has indicated his/her understanding and acceptance.     Dental Advisory Given  Plan Discussed with: Anesthesiologist, CRNA and Surgeon  Anesthesia Plan Comments: (Patient declines spinal and request GA 2/2 her lumbar spine history   Patient consented for risks of anesthesia including but not limited to:  - adverse reactions to medications - damage to eyes, teeth, lips or other oral mucosa - nerve damage due to positioning  - sore throat or hoarseness - Damage to heart, brain, nerves, lungs, other parts of body or loss of life  Patient voiced understanding.)       Anesthesia Quick Evaluation

## 2022-03-28 ENCOUNTER — Telehealth (INDEPENDENT_AMBULATORY_CARE_PROVIDER_SITE_OTHER): Payer: 59 | Admitting: Physician Assistant

## 2022-03-28 DIAGNOSIS — B001 Herpesviral vesicular dermatitis: Secondary | ICD-10-CM

## 2022-03-28 MED ORDER — VALACYCLOVIR HCL 1 G PO TABS: 2000.0000 mg | ORAL_TABLET | Freq: Two times a day (BID) | ORAL | 0 refills | Status: AC

## 2022-03-28 NOTE — Progress Notes (Signed)
     Virtual Visit via Video Note  I connected with Nicole Austin on 03/28/22 at 11:00 AM EST by a video enabled telemedicine application and verified that I am speaking with the correct person using two identifiers. Today's Provider: Talitha Givens, MHS, PA-C Introduced myself to the patient as a PA-C and provided education on APPs in clinical practice.   Location: Patient: at home  Provider: [place], [place]    I discussed the limitations of evaluation and management by telemedicine and the availability of in person appointments. The patient expressed understanding and agreed to proceed.    Chief Complaint  Patient presents with   Mouth Lesions    Upper lip and the creases of lips onset for 3 days    History of Present Illness:    She reports she has bumps and lesions on her lips Reports this is the 3rd day with lesion  She reports she has a hx of these in the past  She has been under a bit of stress - trying to get a hip replacement, surgery yesterday was cancelled.    Observations/Objective:  Due to the nature of the virtual visit, physical exam and observations are limited. Able to obtain the following observations:   Alert, oriented, x3  Appears comfortable, in no acute distress.  No scleral injection, no appreciated hoarseness, tachypnea, wheeze or strider. Able to maintain conversation without visible strain.  No cough appreciated during visit.  Lesion on upper lip and cracking in lip creases bilaterally- does not appear to be draining or swollen on video visit    Assessment and Plan:  Problem List Items Addressed This Visit   None Visit Diagnoses     Recurrent cold sores    -  Primary Acute, recurrent concern Reports hx of cold sores along oral mucosa and lips  States she has about 3 at this time and reports dry, cracking lips Reviewed keeps lips well moisturized and protected from sun or other environmental stressors as  well as emotional/psychological stress impact on recurrence Will send Valtrex 1000 mg - 2 g to be taken PO BID x 1 day for relief Reviewed GFR to ensure dosing and strength was appropriate. Reviewed self-inoculation precautions, sanitizing or discarding objects that have come into contact with her mouth after flare is over- she voiced understanding to the above education components.  Follow up as needed for persistent or progressing symptoms    Relevant Medications   valACYclovir (VALTREX) 1000 MG tablet      Follow Up Instructions:    I discussed the assessment and treatment plan with the patient. The patient was provided an opportunity to ask questions and all were answered. The patient agreed with the plan and demonstrated an understanding of the instructions.   The patient was advised to call back or seek an in-person evaluation if the symptoms worsen or if the condition fails to improve as anticipated.  I provided 10 minutes of non-face-to-face time during this encounter.   No follow-ups on file.   I, Lyndsie Wallman E Joe Gee, PA-C, have reviewed all documentation for this visit. The documentation on 03/28/22 for the exam, diagnosis, procedures, and orders are all accurate and complete.   Talitha Givens, MHS, PA-C [facility]

## 2022-03-29 DIAGNOSIS — M87059 Idiopathic aseptic necrosis of unspecified femur: Secondary | ICD-10-CM | POA: Diagnosis not present

## 2022-03-30 DIAGNOSIS — M87052 Idiopathic aseptic necrosis of left femur: Secondary | ICD-10-CM | POA: Diagnosis not present

## 2022-04-02 NOTE — Addendum Note (Signed)
Addended by: Myles Gip on: 04/02/2022 08:36 AM   Modules accepted: Level of Service

## 2022-04-05 ENCOUNTER — Ambulatory Visit: Payer: 59 | Admitting: Family Medicine

## 2022-04-06 DIAGNOSIS — J449 Chronic obstructive pulmonary disease, unspecified: Secondary | ICD-10-CM | POA: Diagnosis not present

## 2022-04-06 DIAGNOSIS — J441 Chronic obstructive pulmonary disease with (acute) exacerbation: Secondary | ICD-10-CM | POA: Diagnosis not present

## 2022-04-08 DIAGNOSIS — J441 Chronic obstructive pulmonary disease with (acute) exacerbation: Secondary | ICD-10-CM | POA: Diagnosis not present

## 2022-04-08 DIAGNOSIS — J449 Chronic obstructive pulmonary disease, unspecified: Secondary | ICD-10-CM | POA: Diagnosis not present

## 2022-04-09 ENCOUNTER — Emergency Department: Payer: 59

## 2022-04-09 ENCOUNTER — Other Ambulatory Visit: Payer: Self-pay

## 2022-04-09 ENCOUNTER — Observation Stay
Admission: EM | Admit: 2022-04-09 | Discharge: 2022-04-10 | Disposition: A | Discharge status: Home Health | Payer: 59 | Attending: Internal Medicine | Admitting: Internal Medicine

## 2022-04-09 ENCOUNTER — Ambulatory Visit: Payer: Medicaid Other | Admitting: Family Medicine

## 2022-04-09 DIAGNOSIS — Z7901 Long term (current) use of anticoagulants: Secondary | ICD-10-CM | POA: Diagnosis not present

## 2022-04-09 DIAGNOSIS — I2510 Atherosclerotic heart disease of native coronary artery without angina pectoris: Secondary | ICD-10-CM | POA: Diagnosis not present

## 2022-04-09 DIAGNOSIS — F319 Bipolar disorder, unspecified: Secondary | ICD-10-CM | POA: Diagnosis present

## 2022-04-09 DIAGNOSIS — R5383 Other fatigue: Secondary | ICD-10-CM | POA: Diagnosis not present

## 2022-04-09 DIAGNOSIS — M87059 Idiopathic aseptic necrosis of unspecified femur: Secondary | ICD-10-CM | POA: Diagnosis present

## 2022-04-09 DIAGNOSIS — Z1152 Encounter for screening for COVID-19: Secondary | ICD-10-CM | POA: Insufficient documentation

## 2022-04-09 DIAGNOSIS — Z96652 Presence of left artificial knee joint: Secondary | ICD-10-CM | POA: Diagnosis not present

## 2022-04-09 DIAGNOSIS — T50995A Adverse effect of other drugs, medicaments and biological substances, initial encounter: Secondary | ICD-10-CM | POA: Diagnosis not present

## 2022-04-09 DIAGNOSIS — J45909 Unspecified asthma, uncomplicated: Secondary | ICD-10-CM | POA: Insufficient documentation

## 2022-04-09 DIAGNOSIS — Z87891 Personal history of nicotine dependence: Secondary | ICD-10-CM | POA: Insufficient documentation

## 2022-04-09 DIAGNOSIS — E785 Hyperlipidemia, unspecified: Secondary | ICD-10-CM | POA: Diagnosis present

## 2022-04-09 DIAGNOSIS — R42 Dizziness and giddiness: Secondary | ICD-10-CM | POA: Diagnosis not present

## 2022-04-09 DIAGNOSIS — E860 Dehydration: Secondary | ICD-10-CM | POA: Diagnosis not present

## 2022-04-09 DIAGNOSIS — T887XXA Unspecified adverse effect of drug or medicament, initial encounter: Principal | ICD-10-CM | POA: Insufficient documentation

## 2022-04-09 DIAGNOSIS — J449 Chronic obstructive pulmonary disease, unspecified: Secondary | ICD-10-CM | POA: Diagnosis present

## 2022-04-09 DIAGNOSIS — M797 Fibromyalgia: Secondary | ICD-10-CM | POA: Diagnosis present

## 2022-04-09 DIAGNOSIS — R001 Bradycardia, unspecified: Secondary | ICD-10-CM | POA: Diagnosis not present

## 2022-04-09 DIAGNOSIS — R059 Cough, unspecified: Secondary | ICD-10-CM | POA: Insufficient documentation

## 2022-04-09 DIAGNOSIS — R531 Weakness: Secondary | ICD-10-CM | POA: Diagnosis not present

## 2022-04-09 DIAGNOSIS — Z79899 Other long term (current) drug therapy: Secondary | ICD-10-CM

## 2022-04-09 DIAGNOSIS — J4489 Other specified chronic obstructive pulmonary disease: Secondary | ICD-10-CM | POA: Diagnosis not present

## 2022-04-09 DIAGNOSIS — R509 Fever, unspecified: Secondary | ICD-10-CM | POA: Diagnosis not present

## 2022-04-09 DIAGNOSIS — E871 Hypo-osmolality and hyponatremia: Secondary | ICD-10-CM | POA: Insufficient documentation

## 2022-04-09 DIAGNOSIS — G20C Parkinsonism, unspecified: Secondary | ICD-10-CM | POA: Insufficient documentation

## 2022-04-09 DIAGNOSIS — Z8673 Personal history of transient ischemic attack (TIA), and cerebral infarction without residual deficits: Secondary | ICD-10-CM | POA: Insufficient documentation

## 2022-04-09 DIAGNOSIS — M8700 Idiopathic aseptic necrosis of unspecified bone: Secondary | ICD-10-CM | POA: Diagnosis present

## 2022-04-09 DIAGNOSIS — F418 Other specified anxiety disorders: Secondary | ICD-10-CM | POA: Diagnosis present

## 2022-04-09 LAB — COMPREHENSIVE METABOLIC PANEL
ALT: 13 U/L (ref 0–44)
AST: 21 U/L (ref 15–41)
Albumin: 3.2 g/dL — ABNORMAL LOW (ref 3.5–5.0)
Alkaline Phosphatase: 81 U/L (ref 38–126)
Anion gap: 13 (ref 5–15)
BUN: 10 mg/dL (ref 8–23)
CO2: 21 mmol/L — ABNORMAL LOW (ref 22–32)
Calcium: 8.7 mg/dL — ABNORMAL LOW (ref 8.9–10.3)
Chloride: 96 mmol/L — ABNORMAL LOW (ref 98–111)
Creatinine, Ser: 0.98 mg/dL (ref 0.44–1.00)
GFR, Estimated: >60 mL/min (ref >60)
Glucose, Bld: 100 mg/dL — ABNORMAL HIGH (ref 70–99)
Potassium: 4.5 mmol/L (ref 3.5–5.1)
Sodium: 130 mmol/L — ABNORMAL LOW (ref 135–145)
Total Bilirubin: 0.7 mg/dL (ref 0.3–1.2)
Total Protein: 6.5 g/dL (ref 6.5–8.1)

## 2022-04-09 LAB — URINALYSIS, ROUTINE W REFLEX MICROSCOPIC
Bilirubin Urine: NEGATIVE
Glucose, UA: NEGATIVE mg/dL
Hgb urine dipstick: NEGATIVE
Ketones, ur: NEGATIVE mg/dL
Leukocytes,Ua: NEGATIVE
Nitrite: NEGATIVE
Protein, ur: NEGATIVE mg/dL
Specific Gravity, Urine: 1.018 (ref 1.005–1.030)
pH: 7 (ref 5.0–8.0)

## 2022-04-09 LAB — RESP PANEL BY RT-PCR (RSV, FLU A&B, COVID)  RVPGX2
Influenza A by PCR: NEGATIVE
Influenza B by PCR: NEGATIVE
Resp Syncytial Virus by PCR: NEGATIVE
SARS Coronavirus 2 by RT PCR: NEGATIVE

## 2022-04-09 LAB — CBC WITH DIFFERENTIAL/PLATELET
Abs Immature Granulocytes: 0.01 10*3/uL (ref 0.00–0.07)
Basophils Absolute: 0.1 10*3/uL (ref 0.0–0.1)
Basophils Relative: 1 %
Eosinophils Absolute: 0.3 10*3/uL (ref 0.0–0.5)
Eosinophils Relative: 4 %
HCT: 39.1 % (ref 36.0–46.0)
Hemoglobin: 12.8 g/dL (ref 12.0–15.0)
Immature Granulocytes: 0 %
Lymphocytes Relative: 27 %
Lymphs Abs: 1.8 10*3/uL (ref 0.7–4.0)
MCH: 28.9 pg (ref 26.0–34.0)
MCHC: 32.7 g/dL (ref 30.0–36.0)
MCV: 88.3 fL (ref 80.0–100.0)
Monocytes Absolute: 0.6 10*3/uL (ref 0.1–1.0)
Monocytes Relative: 9 %
Neutro Abs: 4 10*3/uL (ref 1.7–7.7)
Neutrophils Relative %: 59 %
Platelets: 221 10*3/uL (ref 150–400)
RBC: 4.43 MIL/uL (ref 3.87–5.11)
RDW: 13.4 % (ref 11.5–15.5)
WBC: 6.8 10*3/uL (ref 4.0–10.5)
nRBC: 0 % (ref 0.0–0.2)

## 2022-04-09 LAB — URINE DRUG SCREEN, QUALITATIVE (ARMC ONLY)
Amphetamines, Ur Screen: NOT DETECTED
Barbiturates, Ur Screen: NOT DETECTED
Benzodiazepine, Ur Scrn: POSITIVE — AB
Cannabinoid 50 Ng, Ur ~~LOC~~: POSITIVE — AB
Cocaine Metabolite,Ur ~~LOC~~: NOT DETECTED
MDMA (Ecstasy)Ur Screen: NOT DETECTED
Methadone Scn, Ur: NOT DETECTED
Opiate, Ur Screen: NOT DETECTED
Phencyclidine (PCP) Ur S: NOT DETECTED
Tricyclic, Ur Screen: NOT DETECTED

## 2022-04-09 LAB — TROPONIN I (HIGH SENSITIVITY): Troponin I (High Sensitivity): 2 ng/L (ref <18)

## 2022-04-09 LAB — LACTIC ACID, PLASMA: Lactic Acid, Venous: 1.5 mmol/L (ref 0.5–1.9)

## 2022-04-09 MED ORDER — HYDRALAZINE HCL 20 MG/ML IJ SOLN: 5.0000 mg | INTRAMUSCULAR | Status: DC | PRN

## 2022-04-09 MED ORDER — ADULT MULTIVITAMIN W/MINERALS CH
1.0000 | ORAL_TABLET | Freq: Every day | ORAL | Status: DC
  Administered 2022-04-09 – 2022-04-10 (×2): 1 via ORAL
  Filled 2022-04-09 (×2): qty 1

## 2022-04-09 MED ORDER — ACETAMINOPHEN 325 MG PO TABS
650.0000 mg | ORAL_TABLET | Freq: Four times a day (QID) | ORAL | Status: DC | PRN
  Administered 2022-04-09 – 2022-04-10 (×3): 650 mg via ORAL
  Filled 2022-04-09 (×3): qty 2

## 2022-04-09 MED ORDER — KETOROLAC TROMETHAMINE 15 MG/ML IJ SOLN
15.0000 mg | Freq: Once | INTRAMUSCULAR | Status: AC
  Administered 2022-04-09: 15 mg via INTRAVENOUS
  Filled 2022-04-09: qty 1

## 2022-04-09 MED ORDER — MOMETASONE FURO-FORMOTEROL FUM 100-5 MCG/ACT IN AERO
2.0000 | INHALATION_SPRAY | Freq: Two times a day (BID) | RESPIRATORY_TRACT | Status: DC
  Administered 2022-04-10: 2 via RESPIRATORY_TRACT

## 2022-04-09 MED ORDER — ENOXAPARIN SODIUM 40 MG/0.4ML IJ SOSY: 40.0000 mg | PREFILLED_SYRINGE | INTRAMUSCULAR | Status: DC

## 2022-04-09 MED ORDER — ALBUTEROL SULFATE (2.5 MG/3ML) 0.083% IN NEBU: 3.0000 mL | INHALATION_SOLUTION | RESPIRATORY_TRACT | Status: DC | PRN

## 2022-04-09 MED ORDER — DM-GUAIFENESIN ER 30-600 MG PO TB12: 1.0000 | ORAL_TABLET | Freq: Two times a day (BID) | ORAL | Status: DC | PRN

## 2022-04-09 MED ORDER — MORPHINE SULFATE (PF) 2 MG/ML IV SOLN
2.0000 mg | INTRAVENOUS | Status: DC | PRN
  Administered 2022-04-09: 2 mg via INTRAVENOUS
  Filled 2022-04-09: qty 1

## 2022-04-09 MED ORDER — TIOTROPIUM BROMIDE MONOHYDRATE 18 MCG IN CAPS
1.0000 | ORAL_CAPSULE | Freq: Every day | RESPIRATORY_TRACT | Status: DC
  Administered 2022-04-10: 18 ug via RESPIRATORY_TRACT

## 2022-04-09 MED ORDER — OXYCODONE-ACETAMINOPHEN 5-325 MG PO TABS
1.0000 | ORAL_TABLET | ORAL | Status: DC | PRN
  Administered 2022-04-09: 1 via ORAL
  Filled 2022-04-09: qty 1

## 2022-04-09 MED ORDER — ENOXAPARIN SODIUM 60 MG/0.6ML IJ SOSY
0.5000 mg/kg | PREFILLED_SYRINGE | INTRAMUSCULAR | Status: DC
  Administered 2022-04-09: 52.5 mg via SUBCUTANEOUS
  Filled 2022-04-09: qty 0.6

## 2022-04-09 MED ORDER — LIDOCAINE 5 % EX PTCH
1.0000 | MEDICATED_PATCH | CUTANEOUS | Status: DC
  Administered 2022-04-09: 1 via TRANSDERMAL
  Filled 2022-04-09: qty 1

## 2022-04-09 MED ORDER — ALPRAZOLAM 0.5 MG PO TABS
0.5000 mg | ORAL_TABLET | Freq: Four times a day (QID) | ORAL | Status: DC
  Administered 2022-04-09 – 2022-04-10 (×4): 0.5 mg via ORAL
  Filled 2022-04-09 (×4): qty 1

## 2022-04-09 MED ORDER — OXYCODONE-ACETAMINOPHEN 5-325 MG PO TABS
1.0000 | ORAL_TABLET | Freq: Four times a day (QID) | ORAL | Status: DC | PRN
  Administered 2022-04-09 – 2022-04-10 (×4): 1 via ORAL
  Filled 2022-04-09 (×4): qty 1

## 2022-04-09 MED ORDER — ATORVASTATIN CALCIUM 20 MG PO TABS
40.0000 mg | ORAL_TABLET | Freq: Every day | ORAL | Status: DC
  Administered 2022-04-09: 40 mg via ORAL
  Filled 2022-04-09: qty 2

## 2022-04-09 MED ORDER — DIVALPROEX SODIUM 250 MG PO DR TAB
250.0000 mg | DELAYED_RELEASE_TABLET | Freq: Two times a day (BID) | ORAL | Status: DC
  Administered 2022-04-09 – 2022-04-10 (×2): 250 mg via ORAL
  Filled 2022-04-09 (×2): qty 1

## 2022-04-09 MED ORDER — LURASIDONE HCL 40 MG PO TABS
80.0000 mg | ORAL_TABLET | Freq: Every day | ORAL | Status: DC
  Filled 2022-04-09: qty 1

## 2022-04-09 MED ORDER — MORPHINE SULFATE (PF) 4 MG/ML IV SOLN
4.0000 mg | Freq: Once | INTRAVENOUS | Status: AC
  Administered 2022-04-09: 4 mg via INTRAVENOUS
  Filled 2022-04-09: qty 1

## 2022-04-09 MED ORDER — ONDANSETRON HCL 4 MG/2ML IJ SOLN
4.0000 mg | Freq: Three times a day (TID) | INTRAMUSCULAR | Status: DC | PRN
  Administered 2022-04-09: 4 mg via INTRAVENOUS
  Filled 2022-04-09: qty 2

## 2022-04-09 NOTE — Plan of Care (Signed)
  Problem: Clinical Measurements: Goal: Will remain free from infection Outcome: Progressing Goal: Diagnostic test results will improve Outcome: Progressing   Problem: Activity: Goal: Risk for activity intolerance will decrease Outcome: Progressing   Problem: Nutrition: Goal: Adequate nutrition will be maintained Outcome: Progressing   Problem: Pain Managment: Goal: General experience of comfort will improve Outcome: Progressing   Problem: Safety: Goal: Ability to remain free from injury will improve Outcome: Progressing

## 2022-04-09 NOTE — ED Triage Notes (Signed)
Patient c/o dizziness and weakness x 2 days when standing up and moving around her house. + orthostatics for EMS.

## 2022-04-09 NOTE — H&P (Addendum)
History and Physical    Nicole Austin [id number] DOB: 1960/11/24 DOA: 04/09/2022  Referring MD/NP/PA:   PCP: Delsa Grana, PA-C   Patient coming from:  The patient is coming from home.  At baseline, pt is independent for most of ADL.        Chief Complaint: dizziness, weakness and drowsiness   HPI: Nicole Austin is a 62 y.o. female with medical history significant of end stage osteoarthritis left hip with AVN, hyperlipidemia, CAD, COPD, gout, depression with anxiety, fibromyalgia, depression with anxiety, bipolar, panic disorder, ADHD, PTSD, RLS, wheelchair-bound, who presents with dizziness, weakness, drowsiness.  Patient states that in the past several days, he has increased generalized weakness and also has dizziness.  No fall or injury.  She has poor appetite and decreased oral intake.  Patient is mildly drowsy during the interview, but is oriented x 3.  Patient denies chest pain, cough, shortness breath.  No nausea, vomiting, diarrhea or abdominal pain no symptoms of UTI.  She states that she has avascular necrosis of left hip.  Patient was originally scheduled for surgery on 1/30, but she wrongly took Xarelto which she supposed to take after surgery, therefore her surgery is rescheduled on 05/17/2022. She complains of severe left hip pain.  Of note, patient has multiple sedative medications on her med list, including Phenergan, Xanax, Depakote, tizanidine, Bentyl, Lexapro, Robaxin, oxycodone, Requip, tramadol.  Not sure exactly what is patient taking currently.  Data reviewed independently and ED Course: pt was found to have WBC 6.8, negative urinalysis, negative COVID PCR for COVID, flu and RSV, GFR> 60, temperature normal, blood pressure 168/65, heart rate 49, 54, RR 21, oxygen saturation 96% on room air.  Chest x-ray negative.  Patient is placed on telemetry bed for observation.  EKG: I have personally reviewed.  Sinus rhythm, nonspecific T wave change.   Review of  Systems:   General: no fevers, chills, no body weight gain, has poor appetite, has fatigue HEENT: no blurry vision, hearing changes or sore throat Respiratory: no dyspnea, coughing, wheezing CV: no chest pain, no palpitations GI: no nausea, vomiting, abdominal pain, diarrhea, constipation GU: no dysuria, burning on urination, increased urinary frequency, hematuria  Ext: no leg edema Neuro: no unilateral weakness, numbness, or tingling, no vision change or hearing loss. Has drowsiness dizziness Skin: no rash, no skin tear. MSK:  Has severe left hip pain Heme: No easy bruising.  Travel history: No recent long distant travel.   Allergy:  Allergies  Allergen Reactions   Chantix [Varenicline] Nausea Only   Glycopyrrolate Rash    Oral irritation    Past Medical History:  Diagnosis Date   ADHD (attention deficit hyperactivity disorder)    Apnea, sleep 06/30/2015   Arthritis    Asthma    Asthma with acute exacerbation 06/30/2015   Bipolar 1 disorder (HCC)    Chronic pain    COPD, moderate ([place]) 11/18/2013   Depression    DOE (dyspnea on exertion)    GERD (gastroesophageal reflux disease) 08/30/2015   Gout 08/16/2014   History of acute myocardial infarction 06/30/2015   History of panic attacks    HPV (human papilloma virus) infection 07/17/2013   Hyperlipidemia    Low HDL (under 40) 08/30/2015   MI (myocardial infarction) ([place]) 2018   MRSA infection 2023   groin abscess   Nose colonized with MRSA 03/21/2022   a.) PCR (+) prior to LEFT THA   Osteoporosis    Parkinson disease    Pre-diabetes  Prediabetes 05/20/2016   PTSD (post-traumatic stress disorder)    Restless leg syndrome    Rhabdomyolysis    Sleep apnea 06/30/2015   Stage 3 severe COPD by GOLD classification ([place]) 11/18/2013   Stroke ([place]) 2018   right arm weakness   Tremors of nervous system    hands   Uncomplicated asthma [id number]   Varicella 02/25/2017    Past Surgical History:  Procedure  Laterality Date   BACK SURGERY     lumbar   CATARACT EXTRACTION W/PHACO Right 01/01/2019   Procedure: CATARACT EXTRACTION PHACO AND INTRAOCULAR LENS PLACEMENT ([place]) right vision blue;  Surgeon: Marchia Meiers, MD;  Location: [facility] ORS;  Service: Ophthalmology;  Laterality: Right;  [place] 00:39.4 CDE 5.49 Fluid Pack lot # [id number]   CATARACT EXTRACTION W/PHACO Left 01/29/2019   Procedure: CATARACT EXTRACTION PHACO AND INTRAOCULAR LENS PLACEMENT ([place]) LEFT Vision Blue;  Surgeon: Marchia Meiers, MD;  Location: [facility] ORS;  Service: Ophthalmology;  Laterality: Left;  [place] 00:51.1 CDE 4.38 Fluid Pack Lot # [id number]   COLONOSCOPY WITH PROPOFOL N/A 08/07/2017   Procedure: COLONOSCOPY WITH PROPOFOL;  Surgeon: Virgel Manifold, MD;  Location: [facility] ENDOSCOPY;  Service: Endoscopy;  Laterality: N/A;   HAND SURGERY Left    fractured with pins   REPLACEMENT TOTAL KNEE Left 2016   SPINAL CORD STIMULATOR INSERTION  2014   SPINAL CORD STIMULATOR REMOVAL  2014   TUBAL LIGATION      Social History:  reports that she quit smoking about 3 weeks ago. Her smoking use included cigarettes. She has a 18.00 pack-year smoking history. She has been exposed to tobacco smoke. She has never used smokeless tobacco. She reports current drug use. Drug: Marijuana. She reports that she does not drink alcohol.  Family History:  Family History  Problem Relation Age of Onset   Hernia Mother    Heart disease Mother    OCD Mother    Diabetes Mother    Parkinson's disease Father    Bipolar disorder Sister    Schizophrenia Sister    ADD / ADHD Sister    Alcohol abuse Brother    Bipolar disorder Sister    Paranoid behavior Sister    ADD / ADHD Sister    ADD / ADHD Son    Dementia Maternal Grandmother    Emphysema Maternal Grandfather    ADD / ADHD Son    ADD / ADHD Son    Depression Son      Prior to Admission medications   Medication Sig Start Date End Date Taking? Authorizing Provider  acetaminophen (TYLENOL) 500 MG  tablet Take 2 tablets (1,000 mg total) by mouth every 8 (eight) hours as needed. 02/08/22   Delsa Grana, PA-C  albuterol (VENTOLIN HFA) 108 (90 Base) MCG/ACT inhaler Inhale 2 puffs into the lungs every 4 (four) hours as needed for wheezing or shortness of breath. 11/08/21   Delsa Grana, PA-C  ALPRAZolam Duanne Moron) 0.5 MG tablet Take 0.5 mg by mouth 4 (four) times daily.    [provider]  atorvastatin (LIPITOR) 40 MG tablet TAKE 1 TABLET BY MOUTH EVERY NIGHT AT BEDTIME 04/25/21   [name], DO  dicyclomine (BENTYL) 20 MG tablet Take 1 tablet (20 mg total) by mouth 3 (three) times daily as needed for spasms (abd cramping). 12/07/21   Delsa Grana, PA-C  divalproex (DEPAKOTE) 250 MG DR tablet Take 1 tablet (250 mg total) by mouth 2 (two) times daily. 11/19/16   Rainey Pines, MD  docusate sodium (COLACE)  100 MG capsule Take 100 mg by mouth 2 (two) times daily.    [provider]  escitalopram (LEXAPRO) 20 MG tablet Take 1 tablet (20 mg total) by mouth daily. 01/05/21 03/27/22  [place] Donnamarie Poag, DO  famotidine (PEPCID) 20 MG tablet TAKE 1 TABLET BY MOUTH 2 TIMES DAILY AS NEEDED FOR NAUSEA/INDIGESTION/STOMACH UPSET. 09/20/21   Bo Merino, FNP  ipratropium-albuterol (DUONEB) 0.5-2.5 (3) MG/3ML SOLN Take 3 mLs by nebulization 3 (three) times daily as needed. 09/01/21   Delsa Grana, PA-C  lurasidone (LATUDA) 80 MG TABS tablet Take 1 tablet (80 mg total) by mouth daily with breakfast. 06/06/21   Myles Gip, DO  methocarbamol (ROBAXIN) 500 MG tablet Take 500 mg by mouth 4 (four) times daily.    [provider]  mometasone-formoterol (DULERA) 100-5 MCG/ACT AERO Inhale 2 puffs into the lungs in the morning and at bedtime. 11/21/21   Delman Kitten, MD  Multiple Vitamin (MULTIVITAMIN WITH MINERALS) TABS tablet Take 1 tablet by mouth daily.    [provider]  nystatin (MYCOSTATIN/NYSTOP) powder Apply 1 application. topically 3 (three) times daily as needed (rash in skin  folds). 06/27/21   Delsa Grana, PA-C  nystatin ointment (MYCOSTATIN) Apply 1 application topically 2 (two) times daily. 01/05/21   [place] Donnamarie Poag, DO  nystatin-triamcinolone ointment (MYCOLOG) Apply 1 Application topically 2 (two) times daily. 11/13/21   Bo Merino, FNP  oxycodone (OXY-IR) 5 MG capsule Take 5 mg by mouth every 4 (four) hours as needed.    [provider]  OXYGEN Inhale 2 L into the lungs as needed.    [provider]  pantoprazole (PROTONIX) 40 MG tablet TAKE 1 TABLET BY MOUTH ONCE A DAY 01/08/22   Delsa Grana, PA-C  promethazine (PHENERGAN) 25 MG tablet Take 25 mg by mouth 2 (two) times daily as needed. 07/31/21   [provider]  rivaroxaban (XARELTO) 10 MG TABS tablet Take 10 mg by mouth daily.    [provider]  rOPINIRole (REQUIP XL) 4 MG 24 hr tablet Take 4 mg by mouth at bedtime.    [provider]  Tiotropium Bromide Monohydrate (SPIRIVA RESPIMAT) 2.5 MCG/ACT AERS Inhale 2 puffs into the lungs daily. 11/21/21   Delman Kitten, MD  tiZANidine (ZANAFLEX) 4 MG tablet Take 1 tablet (4 mg total) by mouth 3 (three) times daily as needed for muscle spasms. 03/05/22   Delsa Grana, PA-C  traMADol (ULTRAM) 50 MG tablet Take 50 mg by mouth every 6 (six) hours as needed.    [provider]    Physical Exam: Vitals:   04/09/22 0943 04/09/22 0944 04/09/22 1100 04/09/22 1130  BP:  (!) 162/69 (!) 148/85 (!) 168/65  Pulse:   (!) 49 (!) 50  Resp:   (!) 21 20  Temp:      TempSrc:      SpO2:   92% 96%  Weight: 104.7 kg      General: Not in acute distress HEENT:       Eyes: PERRL, EOMI, no scleral icterus.       ENT: No discharge from the ears and nose, no pharynx injection, no tonsillar enlargement.        Neck: No JVD, no bruit, no mass felt. Heme: No neck lymph node enlargement. Cardiac: S1/S2, RRR, No murmurs, No gallops or rubs. Respiratory: No rales, wheezing, rhonchi or rubs. GI: Soft, nondistended, nontender, no rebound  pain, no organomegaly, BS present. GU: No hematuria Ext: No pitting  leg edema bilaterally. 1+DP/PT pulse bilaterally. Musculoskeletal: has left hip tenderness Skin: No rashes.  Neuro: Patient is mildly drowsy, but still oriented X3, cranial nerves II-XII grossly intact, moves all extremities Psych: Patient is not psychotic, no suicidal or hemocidal ideation.  Labs on Admission: I have personally reviewed following labs and imaging studies  CBC: Recent Labs  Lab 04/09/22 0947  WBC 6.8  NEUTROABS 4.0  HGB 12.8  HCT 39.1  MCV 88.3  PLT [id number]   Basic Metabolic Panel: Recent Labs  Lab 04/09/22 0947  NA 130*  K 4.5  CL 96*  CO2 21*  GLUCOSE 100*  BUN 10  CREATININE 0.98  CALCIUM 8.7*   GFR: Estimated Creatinine Clearance: 66.3 mL/min (by C-G formula based on SCr of 0.98 mg/dL). Liver Function Tests: Recent Labs  Lab 04/09/22 0947  AST 21  ALT 13  ALKPHOS 81  BILITOT 0.7  PROT 6.5  ALBUMIN 3.2*   No results for input(s): "LIPASE", "AMYLASE" in the last 168 hours. No results for input(s): "AMMONIA" in the last 168 hours. Coagulation Profile: No results for input(s): "INR", "PROTIME" in the last 168 hours. Cardiac Enzymes: No results for input(s): "CKTOTAL", "CKMB", "CKMBINDEX", "TROPONINI" in the last 168 hours. BNP (last 3 results) No results for input(s): "PROBNP" in the last 8760 hours. HbA1C: No results for input(s): "HGBA1C" in the last 72 hours. CBG: No results for input(s): "GLUCAP" in the last 168 hours. Lipid Profile: No results for input(s): "CHOL", "HDL", "LDLCALC", "TRIG", "CHOLHDL", "LDLDIRECT" in the last 72 hours. Thyroid Function Tests: No results for input(s): "TSH", "T4TOTAL", "FREET4", "T3FREE", "THYROIDAB" in the last 72 hours. Anemia Panel: No results for input(s): "VITAMINB12", "FOLATE", "FERRITIN", "TIBC", "IRON", "RETICCTPCT" in the last 72 hours. Urine analysis:    Component Value Date/Time   COLORURINE YELLOW (A) 04/09/2022 1051    APPEARANCEUR CLEAR (A) 04/09/2022 1051   APPEARANCEUR Clear 06/15/2014 0241   LABSPEC 1.018 04/09/2022 1051   LABSPEC 1.024 06/15/2014 0241   PHURINE 7.0 04/09/2022 1051   GLUCOSEU NEGATIVE 04/09/2022 1051   GLUCOSEU Negative 06/15/2014 0241   HGBUR NEGATIVE 04/09/2022 1051   BILIRUBINUR NEGATIVE 04/09/2022 1051   BILIRUBINUR Negative 06/15/2014 0241   KETONESUR NEGATIVE 04/09/2022 1051   PROTEINUR NEGATIVE 04/09/2022 1051   NITRITE NEGATIVE 04/09/2022 1051   LEUKOCYTESUR NEGATIVE 04/09/2022 1051   LEUKOCYTESUR Negative 06/15/2014 0241   Sepsis Labs: @LABRCNTIP$ (procalcitonin:4,lacticidven:4) ) Recent Results (from the past 240 hour(s))  Resp panel by RT-PCR (RSV, Flu A&B, Covid) Anterior Nasal Swab     Status: None   Collection Time: 04/09/22  9:47 AM   Specimen: Anterior Nasal Swab  Result Value Ref Range Status   SARS Coronavirus 2 by RT PCR NEGATIVE NEGATIVE Final    Comment: (NOTE) SARS-CoV-2 target nucleic acids are NOT DETECTED.  The SARS-CoV-2 RNA is generally detectable in upper respiratory specimens during the acute phase of infection. The lowest concentration of SARS-CoV-2 viral copies this assay can detect is 138 copies/mL. A negative result does not preclude SARS-Cov-2 infection and should not be used as the sole basis for treatment or other patient management decisions. A negative result may occur with  improper specimen collection/handling, submission of specimen other than nasopharyngeal swab, presence of viral mutation(s) within the areas targeted by this assay, and inadequate number of viral copies(<138 copies/mL). A negative result must be combined with clinical observations, patient history, and epidemiological information. The expected result is Negative.  Fact Sheet for Patients:  EntrepreneurPulse.com.au  Fact Sheet for Healthcare  Providers:  IncredibleEmployment.be  This test is no t yet approved or cleared by  the [place] and  has been authorized for detection and/or diagnosis of SARS-CoV-2 by FDA under an Emergency Use Authorization (EUA). This EUA will remain  in effect (meaning this test can be used) for the duration of the COVID-19 declaration under Section 564(b)(1) of the Act, 21 U.S.C.section 360bbb-3(b)(1), unless the authorization is terminated  or revoked sooner.       Influenza A by PCR NEGATIVE NEGATIVE Final   Influenza B by PCR NEGATIVE NEGATIVE Final    Comment: (NOTE) The Xpert Xpress SARS-CoV-2/FLU/RSV plus assay is intended as an aid in the diagnosis of influenza from Nasopharyngeal swab specimens and should not be used as a sole basis for treatment. Nasal washings and aspirates are unacceptable for Xpert Xpress SARS-CoV-2/FLU/RSV testing.  Fact Sheet for Patients: EntrepreneurPulse.com.au  Fact Sheet for Healthcare Providers: IncredibleEmployment.be  This test is not yet approved or cleared by the [place] FDA and has been authorized for detection and/or diagnosis of SARS-CoV-2 by FDA under an Emergency Use Authorization (EUA). This EUA will remain in effect (meaning this test can be used) for the duration of the COVID-19 declaration under Section 564(b)(1) of the Act, 21 U.S.C. section 360bbb-3(b)(1), unless the authorization is terminated or revoked.     Resp Syncytial Virus by PCR NEGATIVE NEGATIVE Final    Comment: (NOTE) Fact Sheet for Patients: EntrepreneurPulse.com.au  Fact Sheet for Healthcare Providers: IncredibleEmployment.be  This test is not yet approved or cleared by the [place] FDA and has been authorized for detection and/or diagnosis of SARS-CoV-2 by FDA under an Emergency Use Authorization (EUA). This EUA will remain in effect (meaning this test can be used) for the duration of the COVID-19 declaration under Section 564(b)(1) of the Act, 21  U.S.C. section 360bbb-3(b)(1), unless the authorization is terminated or revoked.  Performed at [facility], [address], [place], [place] [address]      Radiological Exams on Admission: DG Chest Portable 1 View  Result Date: 04/09/2022 CLINICAL DATA:  Weakness EXAM: PORTABLE CHEST 1 VIEW COMPARISON:  Chest x-ray dated January 05, 2022 FINDINGS: The heart size and mediastinal contours are within normal limits. Both lungs are clear. The visualized skeletal structures are unremarkable. IMPRESSION: No active disease. Electronically Signed   By: Yetta Glassman M.D.   On: 04/09/2022 10:40      Assessment/Plan Principal Problem:   Polypharmacy Active Problems:   COPD with asthma   Fibromyalgia   AVN of femur (HCC)   HLD (hyperlipidemia)   Sinus bradycardia   Bipolar disorder with depression ([place])   Depression with anxiety   Assessment and Plan:  Polypharmacy: Patient has generalized weakness, dizziness and mild drowsiness.  Possibly due to pharmacy. Pt has  ten sedative medications on her med list, including Phenergan, Xanax, Depakote, tizanidine, Bentyl, Lexapro, Robaxin, oxycodone, Requip, tramadol.  Not sure exactly what is patient taking currently.  -will place in tele bed for obs -will hold  Phenergan, tizanidine, Bentyl, Robaxin, Requip, tramadol -Fall precaution -Frequent neurocheck -PT/OT  COPD with asthma: stable -As needed Mucinex -Bronchodilator  Fibromyalgia, and left hip AVN of femur ([place]): -prn Percocet and Tylenol -lidoderm patch  HLD (hyperlipidemia) -lipitor  Sinus bradycardia: HR 54, 49.  -tele mornitoring -Avoid using nodal blockers  Bipolar disorder with depression ([place]) and depression with anxiety: Patient strongly wants to continue Depakote, Xanax and Lexapro, refused to hold thses medications. -will continue Depakote, Lexapro and  Xanax     DVT ppx:  SQ Lovenox  Code Status: Full code  Family Communication: not done, no  family member is at bed side.   Disposition Plan:  Anticipate discharge back to previous environment  Consults called:  none  Admission status and Level of care: Telemetry Medical:    for obs     Dispo: The patient is from: Home              Anticipated d/c is to: Home              Anticipated d/c date is: 1 day              Patient currently is not medically stable to d/c.    Severity of Illness:  The appropriate patient status for this patient is OBSERVATION. Observation status is judged to be reasonable and necessary in order to provide the required intensity of service to ensure the patient's safety. The patient's presenting symptoms, physical exam findings, and initial radiographic and laboratory data in the context of their medical condition is felt to place them at decreased risk for further clinical deterioration. Furthermore, it is anticipated that the patient will be medically stable for discharge from the hospital within 2 midnights of admission.        Date of Service 04/09/2022    Ivor Costa [facility]   If 7PM-7AM, please contact night-coverage [email or web address] 04/09/2022, 3:23 PM

## 2022-04-09 NOTE — Progress Notes (Signed)
PHARMACIST - PHYSICIAN COMMUNICATION  CONCERNING:  Enoxaparin (Lovenox) for DVT Prophylaxis    RECOMMENDATION: Patient was prescribed enoxaprin 14m q24 hours for VTE prophylaxis.   Filed Weights   04/09/22 0943  Weight: 104.7 kg (230 lb 12.8 oz)    Body mass index is 43.61 kg/m.  Estimated Creatinine Clearance: 66.3 mL/min (by C-G formula based on SCr of 0.98 mg/dL).   Based on [place] candidate for enoxaparin 0.518mkg TBW SQ every 24 hours based on BMI being >30.  DESCRIPTION: Pharm[facility]  Patient is now receiving enoxaparin 52.5 mg every 24 hours    LiVira BlancoPharmD Clinical Pharmacist  04/09/2022 12:51 PM

## 2022-04-09 NOTE — Progress Notes (Incomplete)
       CROSS COVER NOTE  NAME: Reni Hausner MRN: [id number] DOB : [birth year] ATTENDING PHYSICIAN: Ivor Costa, MD    Date of Service   04/09/2022   HPI/Events of Note   Ongoing chronic 10/10 hip pain  Interventions   Assessment/Plan: Toradol Kpad X    *** professional thanks      To reach the provider On-Call:   7AM- 7PM see care teams to locate the attending and reach out to them via [email or web address] Password: [email or web address] 7PM-7AM contact night-coverage If you still have difficulty reaching the appropriate provider, please page the [facility] (Director on Call) for [facility] on amion for assistance  This document was prepared using Systems analyst and may include unintentional dictation errors.  Neomia Glass DNP, MBA, FNP-BC, PMHNP-BC Nurse Practitioner [facility] Pager [phone]

## 2022-04-09 NOTE — ED Provider Notes (Addendum)
[facility] Provider Note    Event Date/Time   First MD Initiated Contact with Patient 04/09/22 [phone]     (approximate)   History   Weakness (Weakness/dizziness x 2 days when up and moving around. +orthostatics for EMS. Patient states had contact with someone this past week who tested positive for covid.)   HPI  Nicole Austin is a 62 y.o. female here with generalized weakness, deconditioning.  The patient is currently essentially wheelchair-bound but uses a walker to go to the bathroom.  She lives alone.  This is because of hip arthritis and she actually was scheduled to have this replaced at the end of last month but this was delayed due to her accidentally taking a blood thinner.  The patient has had progressive generalized weakness due to this.  However, over the last several days, she has had fairly acute worsening of her weakness.  She states that she has been very fatigued.  She has been having subjective fevers and chills.  She has had a mild cough.  She has had some decreased urinary output as well as decreased appetite.  Denies any known fevers.  No medication changes.     Physical Exam   Triage Vital Signs: ED Triage Vitals  Enc Vitals Group     BP      Pulse      Resp      Temp      Temp src      SpO2      Weight      Height      Head Circumference      Peak Flow      Pain Score      Pain Loc      Pain Edu?      Excl. in [place]?     Most recent vital signs: Vitals:   04/09/22 1100 04/09/22 1130  BP: (!) 148/85 (!) 168/65  Pulse: (!) 49 (!) 50  Resp: (!) 21 20  Temp:    SpO2: 92% 96%     General: Awake, no distress.  CV:  Good peripheral perfusion.  Regular rate and rhythm. Resp:  Normal effort.  Lungs clear to auscultation bilaterally. Abd:  No distention.  No significant tenderness. Other:  Significant tenderness to palpation over the left hip, which appears chronic.  No joint effusion or warmth.   ED Results /  Procedures / Treatments   Labs (all labs ordered are listed, but only abnormal results are displayed) Labs Reviewed  COMPREHENSIVE METABOLIC PANEL - Abnormal; Notable for the following components:      Result Value   Sodium 130 (*)    Chloride 96 (*)    CO2 21 (*)    Glucose, Bld 100 (*)    Calcium 8.7 (*)    Albumin 3.2 (*)    All other components within normal limits  URINALYSIS, ROUTINE W REFLEX MICROSCOPIC - Abnormal; Notable for the following components:   Color, Urine YELLOW (*)    APPearance CLEAR (*)    All other components within normal limits  RESP PANEL BY RT-PCR (RSV, FLU A&B, COVID)  [id number]  CBC WITH DIFFERENTIAL/PLATELET  LACTIC ACID, PLASMA  TROPONIN I (HIGH SENSITIVITY)  TROPONIN I (HIGH SENSITIVITY)     EKG Normal sinus rhythm, ventricular rate 55.  PR 160, QRS 99, QTc 448.  No acute ST elevations or depressions.  EKG evidence of acute ischemic infarct.   RADIOLOGY Chest x-ray: No active disease  I also independently reviewed and agree with radiologist interpretations.   PROCEDURES:  Critical Care performed: No  .1-3 Lead EKG Interpretation  Performed by: Duffy Bruce, MD Authorized by: Duffy Bruce, MD     Interpretation: non-specific     ECG rate:  50-60   ECG rate assessment: normal     Rhythm: sinus rhythm     Ectopy: none     Conduction: normal   Comments:     Indication: Weakness     MEDICATIONS ORDERED IN ED: Medications  morphine (PF) 4 MG/ML injection 4 mg (4 mg Intravenous Given 04/09/22 1046)     IMPRESSION / MDM / ASSESSMENT AND PLAN / ED COURSE  I reviewed the triage vital signs and the nursing notes.                              Differential diagnosis includes, but is not limited to, occult infection such as UTI, PNA, COVID-19 with known sick contacts, AKI/CKD 2/2 NSAID use, deconditioning, polypharmacy  Patient's presentation is most consistent with acute presentation with potential threat to life or bodily  function.  The patient is on the cardiac monitor to evaluate for evidence of arrhythmia and/or significant heart rate changes.  62 year old female here with generalized weakness and deconditioning.  Clinically, the patient appears very weak and essentially unable to even get herself out of bed.  Interestingly, this occurs in the setting of significant pain from her hip for which she has been taking high doses of tizanidine as well as NSAIDs.  She essentially cannot care for herself.  On review of her previous admissions and vitals, her heart rate of 45-50 today is significantly lower than her usual.  I suspect this could be related to her tizanidine use which is likely contributing to her weakness.  Moreover, she just has general deconditioning due to her hip.  Otherwise, no apparent UTI or pneumonia.  Her lab work is overall very reassuring.  UA is clear as mentioned.  No focal neurological deficits.  Having said, she is very weak, persistently bradycardic, and unable to care for self, will admit fo management of her pain and washout of her tizanidine.    Final diagnoses:  Polypharmacy     Rx / DC Orders   ED Discharge Orders     None        Note:  This document was prepared using Dragon voice recognition software and may include unintentional dictation errors.   Duffy Bruce, MD 04/09/22 1206    Duffy Bruce, MD 04/09/22 [phone]

## 2022-04-10 DIAGNOSIS — T887XXA Unspecified adverse effect of drug or medicament, initial encounter: Secondary | ICD-10-CM | POA: Diagnosis not present

## 2022-04-10 DIAGNOSIS — Z7409 Other reduced mobility: Secondary | ICD-10-CM | POA: Diagnosis not present

## 2022-04-10 DIAGNOSIS — J449 Chronic obstructive pulmonary disease, unspecified: Secondary | ICD-10-CM | POA: Diagnosis not present

## 2022-04-10 DIAGNOSIS — Z79899 Other long term (current) drug therapy: Secondary | ICD-10-CM | POA: Diagnosis not present

## 2022-04-10 DIAGNOSIS — J4489 Other specified chronic obstructive pulmonary disease: Secondary | ICD-10-CM | POA: Diagnosis not present

## 2022-04-10 DIAGNOSIS — R531 Weakness: Secondary | ICD-10-CM | POA: Diagnosis not present

## 2022-04-10 DIAGNOSIS — F319 Bipolar disorder, unspecified: Secondary | ICD-10-CM | POA: Diagnosis not present

## 2022-04-10 LAB — BASIC METABOLIC PANEL
Anion gap: 8 (ref 5–15)
BUN: 16 mg/dL (ref 8–23)
CO2: 27 mmol/L (ref 22–32)
Calcium: 8.7 mg/dL — ABNORMAL LOW (ref 8.9–10.3)
Chloride: 95 mmol/L — ABNORMAL LOW (ref 98–111)
Creatinine, Ser: 1.19 mg/dL — ABNORMAL HIGH (ref 0.44–1.00)
GFR, Estimated: 52 mL/min — ABNORMAL LOW (ref >60)
Glucose, Bld: 106 mg/dL — ABNORMAL HIGH (ref 70–99)
Potassium: 3.6 mmol/L (ref 3.5–5.1)
Sodium: 130 mmol/L — ABNORMAL LOW (ref 135–145)

## 2022-04-10 LAB — CBC
HCT: 37.5 % (ref 36.0–46.0)
Hemoglobin: 12.4 g/dL (ref 12.0–15.0)
MCH: 28.6 pg (ref 26.0–34.0)
MCHC: 33.1 g/dL (ref 30.0–36.0)
MCV: 86.4 fL (ref 80.0–100.0)
Platelets: 207 10*3/uL (ref 150–400)
RBC: 4.34 MIL/uL (ref 3.87–5.11)
RDW: 13.4 % (ref 11.5–15.5)
WBC: 7.1 10*3/uL (ref 4.0–10.5)
nRBC: 0 % (ref 0.0–0.2)

## 2022-04-10 LAB — HIV ANTIBODY (ROUTINE TESTING W REFLEX): HIV Screen 4th Generation wRfx: NONREACTIVE

## 2022-04-10 MED ORDER — ALPRAZOLAM 0.5 MG PO TABS: 0.5000 mg | ORAL_TABLET | Freq: Two times a day (BID) | ORAL | 0 refills | Status: DC

## 2022-04-10 MED ORDER — SENNOSIDES-DOCUSATE SODIUM 8.6-50 MG PO TABS: 1.0000 | ORAL_TABLET | Freq: Two times a day (BID) | ORAL | 0 refills | Status: DC

## 2022-04-10 MED ORDER — POLYETHYLENE GLYCOL 3350 17 G PO PACK
17.0000 g | PACK | Freq: Every day | ORAL | Status: DC | PRN
  Filled 2022-04-10: qty 1

## 2022-04-10 MED ORDER — ESCITALOPRAM OXALATE 10 MG PO TABS
20.0000 mg | ORAL_TABLET | Freq: Every day | ORAL | Status: DC
  Administered 2022-04-10: 20 mg via ORAL
  Filled 2022-04-10: qty 2

## 2022-04-10 MED ORDER — LURASIDONE HCL 40 MG PO TABS
80.0000 mg | ORAL_TABLET | Freq: Every day | ORAL | Status: DC
  Administered 2022-04-10: 80 mg via ORAL
  Filled 2022-04-10: qty 2

## 2022-04-10 MED ORDER — LACTULOSE 10 GM/15ML PO SOLN
20.0000 g | Freq: Once | ORAL | Status: DC
  Filled 2022-04-10: qty 30

## 2022-04-10 NOTE — Progress Notes (Signed)
  Chaplain On-Call responded to Spiritual Care Consult Order from Ivor Costa, MD.  The Order was for Advance Directives information for the patient.  Chaplain gave the documents and education to the patient, who is being discharged shortly.  Chaplain Pollyann Samples M.Div., [facility]

## 2022-04-10 NOTE — TOC Progression Note (Signed)
Transition of Care [facility]) - Progression Note    Patient Details  Name: Nicole Austin MRN: [id number] Date of Birth: 03-Oct-1960  [facility]) CM/SW [place], RN Phone Number: 04/10/2022, 8:45 AM  Clinical Narrative:   TOC to follow an assist the patient with DC planning and needs, she is scheduled to have surgery on 05/17/22 on her hip, Per the ED notes she is WC bound at [facility], PT and OT will eval and TOC will assist with needs      Barriers to Discharge: Continued Medical Work up  Expected Discharge Plan and Services                                               Social Determinants of Health (SDOH) Interventions SDOH Screenings   Food Insecurity: No Food Insecurity (04/09/2022)  Housing: Low Risk  (04/09/2022)  Transportation Needs: No Transportation Needs (04/09/2022)  Utilities: Not At Risk (04/09/2022)  Alcohol Screen: Low Risk  (03/21/2022)  Depression (PHQ2-9): Low Risk  (03/28/2022)  Financial Resource Strain: Low Risk  (04/25/2020)  Recent Concern: Financial Resource Strain - Medium Risk (03/17/2020)  Physical Activity: Inactive (03/21/2021)  Social Connections: Socially Isolated (03/21/2021)  Stress: Stress Concern Present (03/21/2021)  Tobacco Use: Medium Risk (04/09/2022)    Readmission Risk Interventions     No data to display

## 2022-04-10 NOTE — Plan of Care (Signed)
  Problem: Clinical Measurements: Goal: Will remain free from infection Outcome: Progressing   Problem: Clinical Measurements: Goal: Diagnostic test results will improve Outcome: Progressing   Problem: Clinical Measurements: Goal: Cardiovascular complication will be avoided Outcome: Progressing   Problem: Elimination: Goal: Will not experience complications related to urinary retention Outcome: Progressing   Problem: Pain Managment: Goal: General experience of comfort will improve Outcome: Progressing   Problem: Safety: Goal: Ability to remain free from injury will improve Outcome: Progressing   Problem: Skin Integrity: Goal: Risk for impaired skin integrity will decrease Outcome: Progressing

## 2022-04-10 NOTE — Discharge Summary (Signed)
Physician Discharge Summary   Patient: Nicole Austin MRN: [id number] DOB: [birth year]  Admit date:     04/09/2022  Discharge date: 04/10/22  Discharge Physician: Sharen Hones   PCP: Delsa Grana, PA-C   Recommendations at discharge:   Follow-up with PCP as outpatient.  Discharge Diagnoses: Principal Problem:   Polypharmacy Active Problems:   COPD with asthma   Fibromyalgia   AVN of femur (HCC)   HLD (hyperlipidemia)   Sinus bradycardia   Bipolar disorder with depression ([place])   Depression with anxiety Hyponatremia. Resolved Problems:   * No resolved hospital problems. *  Hospital Course: Nicole Austin is a 62 y.o. female with medical history significant of end stage osteoarthritis left hip with AVN, hyperlipidemia, CAD, COPD, gout, depression with anxiety, fibromyalgia, depression with anxiety, bipolar, panic disorder, ADHD, PTSD, RLS, wheelchair-bound, who presents with dizziness, weakness, drowsiness.  This appears to be secondary to polypharmacy especially high-dose benzodiazepine.  Medication dose was decreased, patient was In the hospital overnight, she was also evaluated by PT/OT, she ambulated well.  No need for home therapy.  DME ordered.  At this point, she is medically stable for discharge.  She will be followed by PCP as outpatient.  Assessment and Plan: Polypharmacy:  Patient has generalized weakness, dizziness and mild drowsiness.  Possibly due to pharmacy.  Medication dose decreased, also discontinue some.  Follow-up with PCP as outpatient.   COPD with asthma: stable Resume home treatment.   Fibromyalgia, and left hip AVN of femur [facility]): Home treatment, patient is scheduled to have surgery next month.   HLD (hyperlipidemia) -lipitor   Sinus bradycardia: HR 54, 49.  Asymptomatic.   Bipolar disorder with depression ([place]) and depression with anxiety: Patient  -will continue Depakote, Lexapro.  Reduce the dose of Xanax  Mild  hyponatremia Follow-up with PCP, repeat BMP in 1 week.       Consultants: None Procedures performed: None  Disposition: Home Diet recommendation:  Discharge Diet Orders (From admission, onward)     Start     Ordered   04/10/22 0000  Diet - low sodium heart healthy        04/10/22 1105           Cardiac diet DISCHARGE MEDICATION: Allergies as of 04/10/2022       Reactions   Chantix [varenicline] Nausea Only   Glycopyrrolate Rash   Oral irritation        Medication List     STOP taking these medications    dicyclomine 20 MG tablet Commonly known as: BENTYL   docusate sodium 100 MG capsule Commonly known as: COLACE   famotidine 20 MG tablet Commonly known as: PEPCID   ipratropium-albuterol 0.5-2.5 (3) MG/3ML Soln Commonly known as: DUONEB   methocarbamol 500 MG tablet Commonly known as: ROBAXIN   mometasone-formoterol 100-5 MCG/ACT Aero Commonly known as: DULERA   multivitamin with minerals Tabs tablet   oxycodone 5 MG capsule Commonly known as: OXY-IR   pantoprazole 40 MG tablet Commonly known as: PROTONIX   promethazine 25 MG tablet Commonly known as: PHENERGAN   tiZANidine 4 MG tablet Commonly known as: Zanaflex   traMADol 50 MG tablet Commonly known as: ULTRAM       TAKE these medications    acetaminophen 500 MG tablet Commonly known as: TYLENOL Take 2 tablets (1,000 mg total) by mouth every 8 (eight) hours as needed.   albuterol 108 (90 Base) MCG/ACT inhaler Commonly known as: VENTOLIN HFA Inhale 2 puffs into the  lungs every 4 (four) hours as needed for wheezing or shortness of breath.   ALPRAZolam 0.5 MG tablet Commonly known as: XANAX Take 1 tablet (0.5 mg total) by mouth 2 (two) times daily. What changed: when to take this   atorvastatin 40 MG tablet Commonly known as: LIPITOR TAKE 1 TABLET BY MOUTH EVERY NIGHT AT BEDTIME   divalproex 250 MG DR tablet Commonly known as: DEPAKOTE Take 1 tablet (250 mg total) by mouth  2 (two) times daily.   escitalopram 20 MG tablet Commonly known as: Lexapro Take 1 tablet (20 mg total) by mouth daily.   lurasidone 80 MG Tabs tablet Commonly known as: Latuda Take 1 tablet (80 mg total) by mouth daily with breakfast.   nystatin powder Commonly known as: MYCOSTATIN/NYSTOP Apply 1 application. topically 3 (three) times daily as needed (rash in skin folds). What changed: Another medication with the same name was removed. Continue taking this medication, and follow the directions you see here.   nystatin-triamcinolone ointment Commonly known as: MYCOLOG Apply 1 Application topically 2 (two) times daily.   OXYGEN Inhale 2 L into the lungs as needed.   rivaroxaban 10 MG Tabs tablet Commonly known as: XARELTO Take 10 mg by mouth daily.   rOPINIRole 4 MG 24 hr tablet Commonly known as: REQUIP XL Take 4 mg by mouth at bedtime.   senna-docusate 8.6-50 MG tablet Commonly known as: Senokot-S Take 1 tablet by mouth 2 (two) times daily.   Spiriva Respimat 2.5 MCG/ACT Aers Generic drug: Tiotropium Bromide Monohydrate Inhale 2 puffs into the lungs daily.               Durable Medical Equipment  (From admission, onward)           Start     Ordered   04/10/22 1102  For home use only DME Shower stool  Once        04/10/22 1101   04/10/22 1101  For home use only DME Bedside commode  Once       Comments: 3 in 1 please  Question:  Patient needs a bedside commode to treat with the following condition  Answer:  Weakness   04/10/22 1101   04/10/22 0858  For home use only DME Walker  Once       Question:  Patient needs a walker to treat with the following condition  Answer:  Weakness   04/10/22 0858            Follow-up Information     Delsa Grana, PA-C Follow up in 1 week(s).   Specialty: Family Medicine Contact information: [address] [place] [address] [phone]                Discharge Exam: Danley Danker Weights    04/09/22 [id number] 04/09/22 2011  Weight: 104.7 kg 104.3 kg   General exam: Appears calm and comfortable  Respiratory system: Clear to auscultation. Respiratory effort normal. Cardiovascular system: S1 & S2 heard, RRR. No JVD, murmurs, rubs, gallops or clicks. No pedal edema. Gastrointestinal system: Abdomen is nondistended, soft and nontender. No organomegaly or masses felt. Normal bowel sounds heard. Central nervous system: Alert and oriented. No focal neurological deficits. Extremities: Symmetric 5 x 5 power. Skin: No rashes, lesions or ulcers Psychiatry: Judgement and insight appear normal. Mood & affect appropriate.    Condition at discharge: good  The results of significant diagnostics from this hospitalization (including imaging, microbiology, ancillary and laboratory) are listed below for reference.  Imaging Studies: DG Chest Portable 1 View  Result Date: 04/09/2022 CLINICAL DATA:  Weakness EXAM: PORTABLE CHEST 1 VIEW COMPARISON:  Chest x-ray dated January 05, 2022 FINDINGS: The heart size and mediastinal contours are within normal limits. Both lungs are clear. The visualized skeletal structures are unremarkable. IMPRESSION: No active disease. Electronically Signed   By: Yetta Glassman M.D.   On: 04/09/2022 10:40   CT ABDOMEN PELVIS WO CONTRAST  Result Date: 03/13/2022 CLINICAL DATA:  Abdominal and flank pain.  Stone suspected. EXAM: CT ABDOMEN AND PELVIS WITHOUT CONTRAST TECHNIQUE: Multidetector CT imaging of the abdomen and pelvis was performed following the standard protocol without IV contrast. RADIATION DOSE REDUCTION: This exam was performed according to the departmental dose-optimization program which includes automated exposure control, adjustment of the mA and/or kV according to patient size and/or use of iterative reconstruction technique. COMPARISON:  05/14/2019 FINDINGS: Lower chest: Lung bases are clear. Hepatobiliary: Subcentimeter low-attenuation lesion in the anterior  right lobe of the liver is unchanged since prior study, likely representing a small cyst or hemangioma. No new focal lesions. Gallbladder and bile ducts are unremarkable. Pancreas: Unremarkable. No pancreatic ductal dilatation or surrounding inflammatory changes. Spleen: Normal in size without focal abnormality. Adrenals/Urinary Tract: Adrenal glands are unremarkable. Kidneys are normal, without renal calculi, focal lesion, or hydronephrosis. Bladder is unremarkable. Stomach/Bowel: Stomach, small bowel, and colon are not abnormally distended. No wall thickening or inflammatory changes. Appendix is normal. Vascular/Lymphatic: Aortic atherosclerosis. No enlarged abdominal or pelvic lymph nodes. Reproductive: Uterus and bilateral adnexa are unremarkable. Other: No abdominal wall hernia or abnormality. No abdominopelvic ascites. Musculoskeletal: Scarring lateral to the right hip, likely postoperative. No change since prior study. Degenerative changes in the spine. Sclerosis in the superior aspects of both femoral heads consistent with avascular necrosis. No significant progression since the previous study. IMPRESSION: 1. No acute process demonstrated in the abdomen or pelvis. No evidence of bowel obstruction or inflammation. 2. Aortic atherosclerosis. 3. Bone changes consistent with avascular necrosis of both hips. Electronically Signed   By: Lucienne Capers M.D.   On: 03/13/2022 21:48   MR HIP LEFT WO CONTRAST  Result Date: 03/13/2022 CLINICAL DATA:  Chronic left hip pain articular cartilage evaluation EXAM: MR OF THE LEFT HIP WITHOUT CONTRAST TECHNIQUE: Multiplanar, multisequence MR imaging was performed. No intravenous contrast was administered. COMPARISON:  CT examination dated February 23, 2022 and radiograph performed earlier on the same date. FINDINGS: Bone There is contour irregularity of the femoral head with T2 hypointense and hyperintense lines and surrounding marrow edema which extends into the femoral  neck consistent with osteonecrosis of the femoral head (AVN). There is near complete loss of the articular cartilage with multiple area of subchondral collapse as well as subchondral marrow edema of the acetabulum there is small joint effusion. There is also small area of subchondral necrosis of the right femoral head without evidence of surrounding edema, likely a subacute to chronic AVN of the right hip. SI joints are normal. No SI joint widening or erosive changes. Lower lumbar spine demonstrates no focal abnormality. Alignment Normal. No subluxation. Dysplasia None. Labrum Advanced degeneration/diminution of the labrum. Cartilage Near complete loss of the articular cartilage secondary to subacute to chronic AVN/osteoarthritis. Capsule and ligaments Normal. Muscles and Tendons Flexors: Normal. Extensors: Normal. Abductors: Normal. Adductors: Normal. Rotators: Normal. Hamstrings: Normal. Other Findings No bursal fluid. Viscera No abnormality seen in pelvis. No lymphadenopathy. No free fluid in the pelvis. IMPRESSION: 1. Osteonecrosis (AVN) of the left femoral head with surrounding  marrow edema extending into the femoral neck. This is likely a subacute process. 2. Near complete loss of the articular cartilage with multiple area of subchondral collapse as well as subchondral marrow edema of the acetabulum. 3. Small left hip joint effusion, which is likely reactive secondary to AVN. Differential may include septic arthritis, however given bilateral findings most consistent with osteonecrosis. 4. Small area of subchondral necrosis of the right femoral head without evidence of surrounding edema, likely subacute to chronic AVN of the right hip. Electronically Signed   By: Keane Police D.O.   On: 03/13/2022 21:40   MR LUMBAR SPINE WO CONTRAST  Result Date: 03/13/2022 CLINICAL DATA:  Low back pain, cauda equina syndrome suspected EXAM: MRI LUMBAR SPINE WITHOUT CONTRAST TECHNIQUE: Multiplanar, multisequence MR imaging  of the lumbar spine was performed. No intravenous contrast was administered. COMPARISON:  None Available. FINDINGS: Segmentation:  Standard. Alignment:  No substantial sagittal subluxation. Vertebrae: No evidence of fracture, discitis, or suspicious bone lesion. Conus medullaris and cauda equina: Conus extends to the L2 level. Conus appears normal. Paraspinal and other soft tissues: Negative. Disc levels: T12-L1: Mild disc bulge without significant stenosis. L1-L2: No significant disc protrusion, foraminal stenosis, or canal stenosis. L2-L3: No significant disc protrusion, foraminal stenosis, or canal stenosis. L3-L4: Mild disc bulging without significant stenosis. L4-L5: Left eccentric disc bulge and facet arthropathy with mild bilateral foraminal stenosis. No significant canal stenosis. L5-S1: Facet arthropathy.  No significant stenosis. IMPRESSION: Similar degenerative changes (detailed above) without evidence of impingement. Electronically Signed   By: Margaretha Sheffield M.D.   On: 03/13/2022 21:34   DG HIP UNILAT WITH PELVIS 2-3 VIEWS LEFT  Result Date: 03/13/2022 CLINICAL DATA:  Hip pain EXAM: DG HIP (WITH OR WITHOUT PELVIS) 2-3V LEFT COMPARISON:  03/08/2022 FINDINGS: Mild degenerative changes in the left hip with joint space narrowing and early spurring. Right hip maintained. SI joints symmetric and unremarkable. No acute bony abnormality. Specifically, no fracture, subluxation, or dislocation. IMPRESSION: No acute bony abnormality. Electronically Signed   By: Rolm Baptise M.D.   On: 03/13/2022 19:21    Microbiology: Results for orders placed or performed during the hospital encounter of 04/09/22  Resp panel by RT-PCR (RSV, Flu A&B, Covid) Anterior Nasal Swab     Status: None   Collection Time: 04/09/22  9:47 AM   Specimen: Anterior Nasal Swab  Result Value Ref Range Status   SARS Coronavirus 2 by RT PCR NEGATIVE NEGATIVE Final    Comment: (NOTE) SARS-CoV-2 target nucleic acids are NOT  DETECTED.  The SARS-CoV-2 RNA is generally detectable in upper respiratory specimens during the acute phase of infection. The lowest concentration of SARS-CoV-2 viral copies this assay can detect is 138 copies/mL. A negative result does not preclude SARS-Cov-2 infection and should not be used as the sole basis for treatment or other patient management decisions. A negative result may occur with  improper specimen collection/handling, submission of specimen other than nasopharyngeal swab, presence of viral mutation(s) within the areas targeted by this assay, and inadequate number of viral copies(<138 copies/mL). A negative result must be combined with clinical observations, patient history, and epidemiological information. The expected result is Negative.  Fact Sheet for Patients:  EntrepreneurPulse.com.au  Fact Sheet for Healthcare Providers:  IncredibleEmployment.be  This test is no t yet approved or cleared by the [place] FDA and  has been authorized for detection and/or diagnosis of SARS-CoV-2 by FDA under an Emergency Use Authorization (EUA). This EUA will remain  in effect (meaning  this test can be used) for the duration of the COVID-19 declaration under Section 564(b)(1) of the Act, 21 U.S.C.section 360bbb-3(b)(1), unless the authorization is terminated  or revoked sooner.       Influenza A by PCR NEGATIVE NEGATIVE Final   Influenza B by PCR NEGATIVE NEGATIVE Final    Comment: (NOTE) The Xpert Xpress SARS-CoV-2/FLU/RSV plus assay is intended as an aid in the diagnosis of influenza from Nasopharyngeal swab specimens and should not be used as a sole basis for treatment. Nasal washings and aspirates are unacceptable for Xpert Xpress SARS-CoV-2/FLU/RSV testing.  Fact Sheet for Patients: EntrepreneurPulse.com.au  Fact Sheet for Healthcare Providers: IncredibleEmployment.be  This test is not yet  approved or cleared by the [place] FDA and has been authorized for detection and/or diagnosis of SARS-CoV-2 by FDA under an Emergency Use Authorization (EUA). This EUA will remain in effect (meaning this test can be used) for the duration of the COVID-19 declaration under Section 564(b)(1) of the Act, 21 U.S.C. section 360bbb-3(b)(1), unless the authorization is terminated or revoked.     Resp Syncytial Virus by PCR NEGATIVE NEGATIVE Final    Comment: (NOTE) Fact Sheet for Patients: EntrepreneurPulse.com.au  Fact Sheet for Healthcare Providers: IncredibleEmployment.be  This test is not yet approved or cleared by the [place] FDA and has been authorized for detection and/or diagnosis of SARS-CoV-2 by FDA under an Emergency Use Authorization (EUA). This EUA will remain in effect (meaning this test can be used) for the duration of the COVID-19 declaration under Section 564(b)(1) of the Act, 21 U.S.C. section 360bbb-3(b)(1), unless the authorization is terminated or revoked.  Performed at [facility], [address] [place], [place] [address]     Labs: CBC: Recent Labs  Lab 04/09/22 0947 04/10/22 0314  WBC 6.8 7.1  NEUTROABS 4.0  --   HGB 12.8 12.4  HCT 39.1 37.5  MCV 88.3 86.4  PLT 221 [id number]   Basic Metabolic Panel: Recent Labs  Lab 04/09/22 0947 04/10/22 0314  NA 130* 130*  K 4.5 3.6  CL 96* 95*  CO2 21* 27  GLUCOSE 100* 106*  BUN 10 16  CREATININE 0.98 1.19*  CALCIUM 8.7* 8.7*   Liver Function Tests: Recent Labs  Lab 04/09/22 0947  AST 21  ALT 13  ALKPHOS 81  BILITOT 0.7  PROT 6.5  ALBUMIN 3.2*   CBG: No results for input(s): "GLUCAP" in the last 168 hours.  Discharge time spent: greater than 30 minutes.  Signed: Sharen Hones, MD [facility] 04/10/2022

## 2022-04-10 NOTE — Evaluation (Addendum)
Occupational Therapy Evaluation Patient Details Name: Nicole Austin MRN: [id number] DOB: 29-Dec-1960 Today's Date: 04/10/2022   History of Present Illness Nicole Austin is a 62 y.o. female with medical history significant of end stage osteoarthritis left hip with AVN, hyperlipidemia, CAD, COPD, gout, depression with anxiety, fibromyalgia, depression with anxiety, bipolar, panic disorder, ADHD, PTSD, RLS, who presents with dizziness, weakness, drowsiness.   Clinical Impression   Nicole Austin presents with generalized weakness, limited endurance, and pain. She lives alone in a single-story home, 1 STE, has been largely IND in ADLs, although pt's sister assists with bathing, transportation, and shopping. Pt normally ambulates in her apt with a RW and uses a WC for community outings, although of late she has been using the [facility] more frequently in her home. She states that she has constant severe pain in her L hip. During today's evaluation, pt was able to perform bed mobility, transfers, ambulation w/ RW, toileting, dressing, all with Mod I-SUPV. She reports feeling back to her baseline level of fxl mobility. She does endorse 9/10 pain but states that this is her norm. Pt states that she has difficulty getting up and down from her low toilet at home, as well as difficulty getting into and out of the bathtub. Recommend a BSC/3-in-1 as well as a shower bench. Provided educ re: importance of knowing what medications she is taking and any potential side effects or interactions between medications. Encouraged pt to take all her medications into the pharmacy whenever she picks up prescriptions and to consult with the pharmacist about these issues. No additional rehab services are needed at this time. Pt's sister is available to assist with pt's care needs PRN post DC.   Recommendations for follow up therapy are one component of a multi-disciplinary discharge planning process, led by the attending physician.   Recommendations may be updated based on patient status, additional functional criteria and insurance authorization.   Follow Up Recommendations  No OT follow up     Assistance Recommended at Discharge PRN  Patient can return home with the following A little help with bathing/dressing/bathroom;Assist for transportation    Functional Status Assessment  Patient has had a recent decline in their functional status and demonstrates the ability to make significant improvements in function in a reasonable and predictable amount of time.  Equipment Recommendations  BSC/3in1;Tub/shower bench    Recommendations for Other Services       Precautions / Restrictions Precautions Precautions: Fall Restrictions Weight Bearing Restrictions: No      Mobility Bed Mobility Overal bed mobility: Modified Independent                  Transfers Overall transfer level: Needs assistance Equipment used: Rolling walker (2 wheels) Transfers: Sit to/from Stand, Bed to chair/wheelchair/BSC Sit to Stand: Supervision     Step pivot transfers: Supervision            Balance Overall balance assessment: Needs assistance Sitting-balance support: No upper extremity supported, Feet supported Sitting balance-Leahy Scale: Normal     Standing balance support: Bilateral upper extremity supported, During functional activity, Reliant on assistive device for balance Standing balance-Leahy Scale: Good                             ADL either performed or assessed with clinical judgement   ADL Overall ADL's : At baseline;Needs assistance/impaired  Lower Body Dressing: Supervision/safety   Toilet Transfer: Supervision/safety;BSC/3in1;Ambulation;Rolling walker (2 wheels)           Functional mobility during ADLs: Supervision/safety;Rolling walker (2 wheels)       Vision         Perception     Praxis      Pertinent Vitals/Pain Pain  Assessment Pain Assessment: 0-10 Pain Score: 9  Pain Descriptors / Indicators: Aching, Constant Pain Intervention(s): Repositioned     Hand Dominance Right   Extremity/Trunk Assessment Upper Extremity Assessment Upper Extremity Assessment: Overall WFL for tasks assessed   Lower Extremity Assessment Lower Extremity Assessment: Generalized weakness   Cervical / Trunk Assessment Cervical / Trunk Assessment: Normal   Communication Communication Communication: No difficulties   Cognition Arousal/Alertness: Awake/alert Behavior During Therapy: WFL for tasks assessed/performed Overall Cognitive Status: Within Functional Limits for tasks assessed                                       General Comments       Exercises Other Exercises Other Exercises: Provided educ re: tracking medications, home safety, AE for bathing   Shoulder Instructions      Home Living Family/patient expects to be discharged to:: Private residence Living Arrangements: Alone Available Help at Discharge: Family Type of Home: Apartment Home Access: Stairs to enter Technical brewer of Steps: 1 Entrance Stairs-Rails: None Home Layout: One level     Bathroom Shower/Tub: Teacher, early years/pre: Standard     Home Equipment: Conservation officer, nature (2 wheels);Rollator (4 wheels);Cane - single point;Shower seat;Wheelchair - manual          Prior Functioning/Environment Prior Level of Function : Needs assist             Mobility Comments: Uses RW at home, Nicole Austin for community ambulation. No falls history ADLs Comments: IND in most ADL. Has trouble getting into/out of tub; sister assists pt w/ bathing. Pt does not drive, sister assists with transportation and shopping        OT Problem List: Decreased strength;Decreased activity tolerance;Impaired balance (sitting and/or standing);Pain      OT Treatment/Interventions:      OT Goals(Current goals can be found in the care  plan section) Acute Rehab OT Goals Patient Stated Goal: to get hip replacement OT Goal Formulation: With patient Time For Goal Achievement: 04/24/22 Potential to Achieve Goals: Good  OT Frequency:      Co-evaluation              AM-PAC OT "6 Clicks" Daily Activity     Outcome Measure Help from another person eating meals?: None Help from another person taking care of personal grooming?: None Help from another person toileting, which includes using toliet, bedpan, or urinal?: A Little Help from another person bathing (including washing, rinsing, drying)?: A Little Help from another person to put on and taking off regular upper body clothing?: None Help from another person to put on and taking off regular lower body clothing?: A Little 6 Click Score: 21   End of Session Equipment Utilized During Treatment: Rolling walker (2 wheels)  Activity Tolerance: Patient tolerated treatment well Patient left: in chair;with call bell/phone within reach;with nursing/sitter in room  OT Visit Diagnosis: Muscle weakness (generalized) (M62.81);Other abnormalities of gait and mobility (R26.89)                Time: [id number] OT  Time Calculation (min): 17 min Charges:  OT General Charges $OT Visit: 1 Visit OT Evaluation $OT Eval Low Complexity: 1 Low OT Treatments $Self Care/Home Management : 8-22 mins Josiah Lobo, PhD, MS, OTR/L 04/10/22, 10:44 AM

## 2022-04-10 NOTE — Evaluation (Signed)
Physical Therapy Evaluation Patient Details Name: Nicole Austin MRN: [id number] DOB: 08/08/1960 Today's Date: 04/10/2022  History of Present Illness  Nicole Austin is a 62 y.o. female with medical history significant of end stage osteoarthritis left hip with AVN, hyperlipidemia, CAD, COPD, gout, depression with anxiety, fibromyalgia, depression with anxiety, bipolar, panic disorder, ADHD, PTSD, RLS, who presents with dizziness, weakness, drowsiness.  Clinical Impression  Nicole Austin is a pleasant 62 year old female who was admitted for polypharmacy. Nicole Austin performs transfers with cga and able to step pivot from [facility] to hospital bed. RN in room to provide discharge items. Nicole Austin demonstrates deficits with strength/mobility/pain. Would benefit from skilled Nicole Austin to address above deficits and promote optimal return to PLOF. Discussed with patient. Although she is roughly at functional baseline, would like HHPT to work with her to improve strength in preparation of hip replacement surgery next month. Discussed with care team via secure chat. Recommend transition to [place] upon discharge from acute hospitalization.      Recommendations for follow up therapy are one component of a multi-disciplinary discharge planning process, led by the attending physician.  Recommendations may be updated based on patient status, additional functional criteria and insurance authorization.  Follow Up Recommendations Home health Nicole Austin      Assistance Recommended at Discharge Set up Supervision/Assistance  Patient can return home with the following  A little help with walking and/or transfers;A little help with bathing/dressing/bathroom;Help with stairs or ramp for entrance    Equipment Recommendations BSC/3in1  Recommendations for Other Services       Functional Status Assessment Patient has had a recent decline in their functional status and demonstrates the ability to make significant improvements in function in a reasonable  and predictable amount of time.     Precautions / Restrictions Precautions Precautions: Fall Restrictions Weight Bearing Restrictions: No      Mobility  Bed Mobility               General bed mobility comments: received seated on BSC and then left sitting at EOB with RN in room. Didn't perform bed mobility    Transfers Overall transfer level: Needs assistance Equipment used: Rolling walker (2 wheels) Transfers: Sit to/from Stand, Bed to chair/wheelchair/BSC Sit to Stand: Min guard   Step pivot transfers: Min guard       General transfer comment: cues for pushing from seated surface. Once standing, limp noted. Nicole Austin able to step over from [facility] to bed    Ambulation/Gait               General Gait Details: not performed this date  Stairs            Wheelchair Mobility    Modified Rankin (Stroke Patients Only)       Balance Overall balance assessment: Needs assistance Sitting-balance support: No upper extremity supported, Feet supported Sitting balance-Leahy Scale: Normal     Standing balance support: Bilateral upper extremity supported, During functional activity, Reliant on assistive device for balance Standing balance-Leahy Scale: Good                               Pertinent Vitals/Pain Pain Assessment Pain Assessment: Faces Faces Pain Scale: Hurts little more Pain Location: hip Pain Descriptors / Indicators: Aching, Constant Pain Intervention(s): Limited activity within patient's tolerance, Repositioned    Home Living Family/patient expects to be discharged to:: Private residence Living Arrangements: Alone Available Help at Discharge: Family  Type of Home: Apartment Home Access: Stairs to enter Entrance Stairs-Rails: None Entrance Stairs-Number of Steps: 1   Home Layout: One level Home Equipment: Conservation officer, nature (2 wheels);Rollator (4 wheels);Cane - single point;Shower seat;Wheelchair - manual      Prior Function Prior  Level of Function : Needs assist             Mobility Comments: Uses RW at home, [facility] for community ambulation. No falls history ADLs Comments: IND in most ADL. Has trouble getting into/out of tub; sister assists Nicole Austin w/ bathing. Nicole Austin does not drive, sister assists with transportation and shopping     Hand Dominance   Dominant Hand: Right    Extremity/Trunk Assessment   Upper Extremity Assessment Upper Extremity Assessment: Overall WFL for tasks assessed    Lower Extremity Assessment Lower Extremity Assessment: Generalized weakness (B LE grossly 4/5)    Cervical / Trunk Assessment Cervical / Trunk Assessment: Normal  Communication   Communication: No difficulties  Cognition Arousal/Alertness: Awake/alert Behavior During Therapy: WFL for tasks assessed/performed Overall Cognitive Status: Within Functional Limits for tasks assessed                                          General Comments      Exercises     Assessment/Plan    Nicole Austin Assessment Patient needs continued Nicole Austin services  Nicole Austin Problem List Decreased strength;Decreased balance;Decreased mobility;Pain;Decreased knowledge of use of DME;Decreased safety awareness       Nicole Austin Treatment Interventions Gait training;Stair training;Therapeutic exercise;Balance training;DME instruction    Nicole Austin Goals (Current goals can be found in the Care Plan section)  Acute Rehab Nicole Austin Goals Patient Stated Goal: to go home Nicole Austin Goal Formulation: With patient Time For Goal Achievement: 04/24/22 Potential to Achieve Goals: Good    Frequency Min 2X/week     Co-evaluation               AM-PAC Nicole Austin "6 Clicks" Mobility  Outcome Measure Help needed turning from your back to your side while in a flat bed without using bedrails?: A Little Help needed moving from lying on your back to sitting on the side of a flat bed without using bedrails?: A Little Help needed moving to and from a bed to a chair (including a wheelchair)?: A  Little Help needed standing up from a chair using your arms (e.g., wheelchair or bedside chair)?: A Little Help needed to walk in hospital room?: A Little Help needed climbing 3-5 steps with a railing? : A Little 6 Click Score: 18    End of Session   Activity Tolerance: Patient tolerated treatment well Patient left: in bed Nurse Communication: Mobility status Nicole Austin Visit Diagnosis: Difficulty in walking, not elsewhere classified (R26.2);Unsteadiness on feet (R26.81);Pain Pain - Right/Left: Left Pain - part of body: Hip    Time: [id number] Nicole Austin Time Calculation (min) (ACUTE ONLY): 8 min   Charges:   Nicole Austin Evaluation $Nicole Austin Eval Low Complexity: 1 Low          Greggory Stallion, Nicole Austin, DPT, GCS [phone]   Sufyan Meidinger 04/10/2022, 11:59 AM

## 2022-04-10 NOTE — TOC Progression Note (Signed)
Transition of Care [facility]) - Progression Note    Patient Details  Name: Nicole Austin MRN: [id number] Date of Birth: Feb 08, 1961  Transition of Care [facility]) CM/SW [place], RN Phone Number: 04/10/2022, 11:35 AM  Clinical Narrative:   Spoke with the patient and she stated that she will be going to stay at her sisters house at [place], she is agreeable to [place] has accepted her for [facility], Her sister will provide transport    Expected Discharge Plan: [place] Barriers to Discharge: Continued Medical Work up  Expected Discharge Plan and Services   Discharge Planning Services: CM Consult   Living arrangements for the past 2 months: Single Family Home Expected Discharge Date: 04/10/22               DME Arranged: 3-N-1 DME Agency: [employer] Date DME Agency Contacted: 04/10/22 Time DME Agency Contacted: 1134 Representative spoke with at DME Agency: [place]: PT, OT [place] Agency: [place] ([place]) Date [place]: 04/10/22 Time [place]: 1134 Representative spoke with at [place]: [place] ([place]) Interventions SDOH Screenings   Food Insecurity: No Food Insecurity (04/09/2022)  Housing: Low Risk  (04/09/2022)  Transportation Needs: No Transportation Needs (04/09/2022)  Utilities: Not At Risk (04/09/2022)  Alcohol Screen: Low Risk  (03/21/2022)  Depression (PHQ2-9): Low Risk  (03/28/2022)  Financial Resource Strain: Low Risk  (04/25/2020)  Recent Concern: Financial Resource Strain - Medium Risk (03/17/2020)  Physical Activity: Inactive (03/21/2021)  Social Connections: Socially Isolated (03/21/2021)  Stress: Stress Concern Present (03/21/2021)  Tobacco Use: Medium Risk (04/09/2022)    Readmission Risk Interventions     No data to display

## 2022-04-10 NOTE — Progress Notes (Signed)
Patient is not able to walk the distance required to go the bathroom, or he/she is unable to safely negotiate stairs required to access the bathroom.  A 3in1 BSC will alleviate this problem  

## 2022-04-11 ENCOUNTER — Telehealth: Payer: Self-pay

## 2022-04-11 NOTE — Transitions of Care (Post Inpatient/ED Visit) (Signed)
   04/11/2022  Name: Nicole Austin MRN: [id number] DOB: 1961/02/01  Today's TOC FU Call Status: Today's TOC FU Call Status:: Unsuccessul Call (1st Attempt) Unsuccessful Call (1st Attempt) Date: 04/11/22  Attempted to reach the patient regarding the most recent Inpatient/ED visit.  Follow Up Plan: Additional outreach attempts will be made to reach the patient to complete the Transitions of Care (Post Inpatient/ED visit) call.   Signature Juanda Crumble, [place] Direct Dial [phone]

## 2022-04-12 DIAGNOSIS — Z8614 Personal history of Methicillin resistant Staphylococcus aureus infection: Secondary | ICD-10-CM | POA: Diagnosis not present

## 2022-04-12 DIAGNOSIS — M87052 Idiopathic aseptic necrosis of left femur: Secondary | ICD-10-CM | POA: Diagnosis not present

## 2022-04-12 DIAGNOSIS — Z87891 Personal history of nicotine dependence: Secondary | ICD-10-CM | POA: Diagnosis not present

## 2022-04-12 DIAGNOSIS — G2581 Restless legs syndrome: Secondary | ICD-10-CM | POA: Diagnosis not present

## 2022-04-12 DIAGNOSIS — I69351 Hemiplegia and hemiparesis following cerebral infarction affecting right dominant side: Secondary | ICD-10-CM | POA: Diagnosis not present

## 2022-04-12 DIAGNOSIS — R278 Other lack of coordination: Secondary | ICD-10-CM | POA: Diagnosis not present

## 2022-04-12 DIAGNOSIS — I2510 Atherosclerotic heart disease of native coronary artery without angina pectoris: Secondary | ICD-10-CM | POA: Diagnosis not present

## 2022-04-12 DIAGNOSIS — M109 Gout, unspecified: Secondary | ICD-10-CM | POA: Diagnosis not present

## 2022-04-12 DIAGNOSIS — Z9181 History of falling: Secondary | ICD-10-CM | POA: Diagnosis not present

## 2022-04-12 DIAGNOSIS — M1612 Unilateral primary osteoarthritis, left hip: Secondary | ICD-10-CM | POA: Diagnosis not present

## 2022-04-12 DIAGNOSIS — R7301 Impaired fasting glucose: Secondary | ICD-10-CM | POA: Diagnosis not present

## 2022-04-12 DIAGNOSIS — M797 Fibromyalgia: Secondary | ICD-10-CM | POA: Diagnosis not present

## 2022-04-12 DIAGNOSIS — G20A1 Parkinson's disease without dyskinesia, without mention of fluctuations: Secondary | ICD-10-CM | POA: Diagnosis not present

## 2022-04-12 DIAGNOSIS — K219 Gastro-esophageal reflux disease without esophagitis: Secondary | ICD-10-CM | POA: Diagnosis not present

## 2022-04-12 DIAGNOSIS — M810 Age-related osteoporosis without current pathological fracture: Secondary | ICD-10-CM | POA: Diagnosis not present

## 2022-04-12 DIAGNOSIS — J4489 Other specified chronic obstructive pulmonary disease: Secondary | ICD-10-CM | POA: Diagnosis not present

## 2022-04-12 DIAGNOSIS — I252 Old myocardial infarction: Secondary | ICD-10-CM | POA: Diagnosis not present

## 2022-04-12 DIAGNOSIS — G4730 Sleep apnea, unspecified: Secondary | ICD-10-CM | POA: Diagnosis not present

## 2022-04-12 DIAGNOSIS — G8929 Other chronic pain: Secondary | ICD-10-CM | POA: Diagnosis not present

## 2022-04-12 DIAGNOSIS — E785 Hyperlipidemia, unspecified: Secondary | ICD-10-CM | POA: Diagnosis not present

## 2022-04-12 DIAGNOSIS — Z9981 Dependence on supplemental oxygen: Secondary | ICD-10-CM | POA: Diagnosis not present

## 2022-04-12 NOTE — Transitions of Care (Post Inpatient/ED Visit) (Signed)
   04/12/2022  Name: Nicole Austin MRN: [id number] DOB: Sep 25, 1960  Today's TOC FU Call Status: Today's TOC FU Call Status:: Unsuccessful Call (2nd Attempt) Unsuccessful Call (1st Attempt) Date: 04/11/22 Unsuccessful Call (2nd Attempt) Date: 04/12/22  Attempted to reach the patient regarding the most recent Inpatient/ED visit.  Follow Up Plan: Additional outreach attempts will be made to reach the patient to complete the Transitions of Care (Post Inpatient/ED visit) call.   Signature Juanda Crumble, [place] Direct Dial [phone]

## 2022-04-13 ENCOUNTER — Telehealth: Payer: Self-pay | Admitting: Family Medicine

## 2022-04-13 NOTE — Transitions of Care (Post Inpatient/ED Visit) (Signed)
   04/13/2022  Name: Faigy Hibben MRN: [id number] DOB: 09/06/1960  Today's TOC FU Call Status: Today's TOC FU Call Status:: Successful TOC FU Call Competed Unsuccessful Call (1st Attempt) Date: 04/11/22 Unsuccessful Call (2nd Attempt) Date: 04/12/22 [facility] FU Call Complete Date: 04/13/22  Transition Care Management Follow-up Telephone Call Date of Discharge: 04/10/22 Discharge Facility: [facility] Type of Discharge: Inpatient Admission Primary Inpatient Discharge Diagnosis:: drug therapy How have you been since you were released from the hospital?: Better Any questions or concerns?: No  Items Reviewed: Did you receive and understand the discharge instructions provided?: Yes Medications obtained and verified?: Yes (Medications Reviewed) Any new allergies since your discharge?: No Dietary orders reviewed?: Yes Do you have support at home?: Yes People in Home: spouse  Home Care and Equipment/Supplies: [place] Ordered?: Yes Name of [place]:: [place] set up a time to come to your home?: Yes [place] Visit Date: 04/15/22 Any new equipment or medical supplies ordered?: NA  Functional Questionnaire: Do you need assistance with bathing/showering or dressing?: Yes Do you need assistance with meal preparation?: No Do you need assistance with eating?: No Do you have difficulty maintaining continence: No Do you need assistance with getting out of bed/getting out of a chair/moving?: No Do you have difficulty managing or taking your medications?: No  Folllow up appointments reviewed: PCP Follow-up appointment confirmed?: Yes Date of PCP follow-up appointment?: 04/24/22 Follow-up Provider: [facility] Follow-up appointment confirmed?: NA Do you need transportation to your follow-up appointment?: No Do you understand care options if your condition(s) worsen?: Yes-patient verbalized understanding    [place], [place] Direct Dial [phone]

## 2022-04-13 NOTE — Telephone Encounter (Signed)
[place] calling from [place] is calling to request verbal orders for PT, OT, SW, [place].  Frequency for Aide- 1 w 3 Frequency for PT- 1 W 4 Evaluation for PT & OT  [phone] Verbal ok on Vm

## 2022-04-13 NOTE — Telephone Encounter (Signed)
Left verbal order through vm.

## 2022-04-14 LAB — HM DIABETES FOOT EXAM: HM Diabetic Foot Exam: NORMAL

## 2022-04-14 LAB — HEMOGLOBIN A1C: Hemoglobin A1C: 4.1

## 2022-04-15 DIAGNOSIS — M87059 Idiopathic aseptic necrosis of unspecified femur: Secondary | ICD-10-CM | POA: Diagnosis not present

## 2022-04-16 ENCOUNTER — Inpatient Hospital Stay: Payer: 59 | Admitting: Family Medicine

## 2022-04-17 DIAGNOSIS — G4730 Sleep apnea, unspecified: Secondary | ICD-10-CM | POA: Diagnosis not present

## 2022-04-17 DIAGNOSIS — Z87891 Personal history of nicotine dependence: Secondary | ICD-10-CM | POA: Diagnosis not present

## 2022-04-17 DIAGNOSIS — M810 Age-related osteoporosis without current pathological fracture: Secondary | ICD-10-CM | POA: Diagnosis not present

## 2022-04-17 DIAGNOSIS — R7301 Impaired fasting glucose: Secondary | ICD-10-CM | POA: Diagnosis not present

## 2022-04-17 DIAGNOSIS — Z9181 History of falling: Secondary | ICD-10-CM | POA: Diagnosis not present

## 2022-04-17 DIAGNOSIS — G20A1 Parkinson's disease without dyskinesia, without mention of fluctuations: Secondary | ICD-10-CM | POA: Diagnosis not present

## 2022-04-17 DIAGNOSIS — Z8614 Personal history of Methicillin resistant Staphylococcus aureus infection: Secondary | ICD-10-CM | POA: Diagnosis not present

## 2022-04-17 DIAGNOSIS — Z9981 Dependence on supplemental oxygen: Secondary | ICD-10-CM | POA: Diagnosis not present

## 2022-04-17 DIAGNOSIS — M109 Gout, unspecified: Secondary | ICD-10-CM | POA: Diagnosis not present

## 2022-04-17 DIAGNOSIS — R278 Other lack of coordination: Secondary | ICD-10-CM | POA: Diagnosis not present

## 2022-04-17 DIAGNOSIS — I69351 Hemiplegia and hemiparesis following cerebral infarction affecting right dominant side: Secondary | ICD-10-CM | POA: Diagnosis not present

## 2022-04-17 DIAGNOSIS — M87052 Idiopathic aseptic necrosis of left femur: Secondary | ICD-10-CM | POA: Diagnosis not present

## 2022-04-17 DIAGNOSIS — K219 Gastro-esophageal reflux disease without esophagitis: Secondary | ICD-10-CM | POA: Diagnosis not present

## 2022-04-17 DIAGNOSIS — I2510 Atherosclerotic heart disease of native coronary artery without angina pectoris: Secondary | ICD-10-CM | POA: Diagnosis not present

## 2022-04-17 DIAGNOSIS — M797 Fibromyalgia: Secondary | ICD-10-CM | POA: Diagnosis not present

## 2022-04-17 DIAGNOSIS — M1612 Unilateral primary osteoarthritis, left hip: Secondary | ICD-10-CM | POA: Diagnosis not present

## 2022-04-17 DIAGNOSIS — G2581 Restless legs syndrome: Secondary | ICD-10-CM | POA: Diagnosis not present

## 2022-04-17 DIAGNOSIS — J4489 Other specified chronic obstructive pulmonary disease: Secondary | ICD-10-CM | POA: Diagnosis not present

## 2022-04-17 DIAGNOSIS — G8929 Other chronic pain: Secondary | ICD-10-CM | POA: Diagnosis not present

## 2022-04-17 DIAGNOSIS — I252 Old myocardial infarction: Secondary | ICD-10-CM | POA: Diagnosis not present

## 2022-04-17 DIAGNOSIS — E785 Hyperlipidemia, unspecified: Secondary | ICD-10-CM | POA: Diagnosis not present

## 2022-04-18 DIAGNOSIS — Z8614 Personal history of Methicillin resistant Staphylococcus aureus infection: Secondary | ICD-10-CM | POA: Diagnosis not present

## 2022-04-18 DIAGNOSIS — I2510 Atherosclerotic heart disease of native coronary artery without angina pectoris: Secondary | ICD-10-CM | POA: Diagnosis not present

## 2022-04-18 DIAGNOSIS — G20A1 Parkinson's disease without dyskinesia, without mention of fluctuations: Secondary | ICD-10-CM | POA: Diagnosis not present

## 2022-04-18 DIAGNOSIS — Z87891 Personal history of nicotine dependence: Secondary | ICD-10-CM | POA: Diagnosis not present

## 2022-04-18 DIAGNOSIS — Z9181 History of falling: Secondary | ICD-10-CM | POA: Diagnosis not present

## 2022-04-18 DIAGNOSIS — R278 Other lack of coordination: Secondary | ICD-10-CM | POA: Diagnosis not present

## 2022-04-18 DIAGNOSIS — M1612 Unilateral primary osteoarthritis, left hip: Secondary | ICD-10-CM | POA: Diagnosis not present

## 2022-04-18 DIAGNOSIS — I69351 Hemiplegia and hemiparesis following cerebral infarction affecting right dominant side: Secondary | ICD-10-CM | POA: Diagnosis not present

## 2022-04-18 DIAGNOSIS — G8929 Other chronic pain: Secondary | ICD-10-CM | POA: Diagnosis not present

## 2022-04-18 DIAGNOSIS — G2581 Restless legs syndrome: Secondary | ICD-10-CM | POA: Diagnosis not present

## 2022-04-18 DIAGNOSIS — E785 Hyperlipidemia, unspecified: Secondary | ICD-10-CM | POA: Diagnosis not present

## 2022-04-18 DIAGNOSIS — G4730 Sleep apnea, unspecified: Secondary | ICD-10-CM | POA: Diagnosis not present

## 2022-04-18 DIAGNOSIS — K219 Gastro-esophageal reflux disease without esophagitis: Secondary | ICD-10-CM | POA: Diagnosis not present

## 2022-04-18 DIAGNOSIS — M797 Fibromyalgia: Secondary | ICD-10-CM | POA: Diagnosis not present

## 2022-04-18 DIAGNOSIS — M810 Age-related osteoporosis without current pathological fracture: Secondary | ICD-10-CM | POA: Diagnosis not present

## 2022-04-18 DIAGNOSIS — M109 Gout, unspecified: Secondary | ICD-10-CM | POA: Diagnosis not present

## 2022-04-18 DIAGNOSIS — R7301 Impaired fasting glucose: Secondary | ICD-10-CM | POA: Diagnosis not present

## 2022-04-18 DIAGNOSIS — I252 Old myocardial infarction: Secondary | ICD-10-CM | POA: Diagnosis not present

## 2022-04-18 DIAGNOSIS — Z9981 Dependence on supplemental oxygen: Secondary | ICD-10-CM | POA: Diagnosis not present

## 2022-04-18 DIAGNOSIS — J4489 Other specified chronic obstructive pulmonary disease: Secondary | ICD-10-CM | POA: Diagnosis not present

## 2022-04-18 DIAGNOSIS — M87052 Idiopathic aseptic necrosis of left femur: Secondary | ICD-10-CM | POA: Diagnosis not present

## 2022-04-19 ENCOUNTER — Telehealth: Payer: Self-pay | Admitting: Family Medicine

## 2022-04-19 NOTE — Telephone Encounter (Signed)
Needs an appointment.

## 2022-04-19 NOTE — Telephone Encounter (Signed)
Copied from [place] [id number]. Topic: General - Other >> Apr 19, 2022  1:28 PM Dominique A wrote: Reason for CRM: Pt states that she will be having total hip replacement on 05/17/22 and is need to speak with PCP regarding needing a walker and shower bench.  Please advise (pt would like a call back)

## 2022-04-19 NOTE — Telephone Encounter (Signed)
Pt informed, pt already has an appointment.

## 2022-04-24 ENCOUNTER — Encounter: Payer: Self-pay | Admitting: Family Medicine

## 2022-04-24 ENCOUNTER — Ambulatory Visit (INDEPENDENT_AMBULATORY_CARE_PROVIDER_SITE_OTHER): Payer: 59 | Admitting: Family Medicine

## 2022-04-24 VITALS — BP 124/76 | HR 75 | Temp 98.0°F | Resp 16 | Ht 61.0 in | Wt 225.7 lb

## 2022-04-24 DIAGNOSIS — R269 Unspecified abnormalities of gait and mobility: Secondary | ICD-10-CM | POA: Diagnosis not present

## 2022-04-24 DIAGNOSIS — E871 Hypo-osmolality and hyponatremia: Secondary | ICD-10-CM | POA: Diagnosis not present

## 2022-04-24 DIAGNOSIS — G8929 Other chronic pain: Secondary | ICD-10-CM

## 2022-04-24 DIAGNOSIS — Z09 Encounter for follow-up examination after completed treatment for conditions other than malignant neoplasm: Secondary | ICD-10-CM | POA: Diagnosis not present

## 2022-04-24 DIAGNOSIS — M545 Low back pain, unspecified: Secondary | ICD-10-CM

## 2022-04-24 DIAGNOSIS — J449 Chronic obstructive pulmonary disease, unspecified: Secondary | ICD-10-CM

## 2022-04-24 DIAGNOSIS — Z79899 Other long term (current) drug therapy: Secondary | ICD-10-CM | POA: Diagnosis not present

## 2022-04-24 DIAGNOSIS — M87052 Idiopathic aseptic necrosis of left femur: Secondary | ICD-10-CM

## 2022-04-24 DIAGNOSIS — M87051 Idiopathic aseptic necrosis of right femur: Secondary | ICD-10-CM

## 2022-04-24 NOTE — Progress Notes (Signed)
Name: Nicole Austin   MRN: [id number]    DOB: [birth year]   Date:04/24/2022       Progress Note  Chief Complaint  Patient presents with   Hospitalization Follow-up    Pt states feeling much better   request    Requesting a walker and shower bench for upcoming surgery     Subjective:   [name] is a 62 y.o. female, presents to clinic for HFU  Here for hospital follow up/transition of care.  Admit date: 04/09/2022 Discharge date: 04/10/2022 Transition of care was initiated previously by lanette hamilton on 2/14/202 and med changes, diagnosis, specialist follow ups and pts symptoms and condition were all reviewed.  Pt was admitted for polypharmacy and labs showed some dehydration/electrolyte abnormality New medications started per hospitalization - none  Labs due today - CMP CBC Pt feels much better  She has stopped many of the sedating meds, but is still on several and she's continued her chronic xanax per Dr. Kasandra Knudsen  Pt needs walker and shower bench - here for orders, she has been using WC  and not able to ambulate much, she has been consulting with ortho/spine She is already working with [place] - from hospital and discharge orders  Surgery March 21st The lumbar and hip MRI showed left hip subacute to chronic AVN/osteoarthritis CT abd/pelvis showed avascular necrosis of bilateral hips She is getting pain meds from the ortho surgeon - controlled substance database reviewed today with multiple rx's for pain meds and chronic benzos Discussed risk  She stopped smoking hoping to do better with surgery      Current Outpatient Medications:    acetaminophen (TYLENOL) 500 MG tablet, Take 2 tablets (1,000 mg total) by mouth every 8 (eight) hours as needed., Disp: 90 tablet, Rfl: 0   albuterol (VENTOLIN HFA) 108 (90 Base) MCG/ACT inhaler, Inhale 2 puffs into the lungs every 4 (four) hours as needed for wheezing or shortness of breath., Disp: 18 g, Rfl: 2    ALPRAZolam (XANAX) 0.5 MG tablet, Take 1 tablet (0.5 mg total) by mouth 2 (two) times daily., Disp: , Rfl: 0   atorvastatin (LIPITOR) 40 MG tablet, TAKE 1 TABLET BY MOUTH EVERY NIGHT AT BEDTIME, Disp: 90 tablet, Rfl: 3   divalproex (DEPAKOTE) 250 MG DR tablet, Take 1 tablet (250 mg total) by mouth 2 (two) times daily., Disp: 60 tablet, Rfl: 2   lurasidone (LATUDA) 80 MG TABS tablet, Take 1 tablet (80 mg total) by mouth daily with breakfast., Disp: 90 tablet, Rfl: 0   nystatin (MYCOSTATIN/NYSTOP) powder, Apply 1 application. topically 3 (three) times daily as needed (rash in skin folds)., Disp: 60 g, Rfl: 1   nystatin-triamcinolone ointment (MYCOLOG), Apply 1 Application topically 2 (two) times daily., Disp: 30 g, Rfl: 0   OXYGEN, Inhale 2 L into the lungs as needed., Disp: , Rfl:    rivaroxaban (XARELTO) 10 MG TABS tablet, Take 10 mg by mouth daily., Disp: , Rfl:    rOPINIRole (REQUIP XL) 4 MG 24 hr tablet, Take 4 mg by mouth at bedtime., Disp: , Rfl:    senna-docusate (SENOKOT-S) 8.6-50 MG tablet, Take 1 tablet by mouth 2 (two) times daily., Disp: 100 tablet, Rfl: 0   Tiotropium Bromide Monohydrate (SPIRIVA RESPIMAT) 2.5 MCG/ACT AERS, Inhale 2 puffs into the lungs daily., Disp: 1 each, Rfl: 0   escitalopram (LEXAPRO) 20 MG tablet, Take 1 tablet (20 mg total) by mouth daily., Disp: 30 tablet, Rfl: 2  Patient Active  Problem List   Diagnosis Date Noted   Polypharmacy 04/09/2022   Sinus bradycardia 04/09/2022   Depression with anxiety 04/09/2022   AVN of femur ([place]) 03/21/2022   MRSA (methicillin resistant staph aureus) culture positive 05/04/2021   Hyperglycemia 01/01/2021   Prolonged QT interval 01/01/2021   Benign neoplasm of ascending colon    Polyp of sigmoid colon    Bipolar disorder with depression ([place]) 02/25/2017   ADD (attention deficit disorder) 02/25/2017   Marijuana use 02/13/2017   Neuropathy, peripheral 05/07/2016   Vitamin B12 deficiency 10/24/2015   Fibromyalgia 08/30/2015    Low HDL (under 40) 08/30/2015   GERD (gastroesophageal reflux disease) 08/30/2015   OP (osteoporosis) 06/30/2015   Sleep apnea 06/30/2015   HLD (hyperlipidemia) 05/13/2015   Morbid (severe) obesity due to excess calories ([place]) 08/16/2014   Low back pain with sciatica 08/04/2014   Current tobacco use 08/04/2014   Polysubstance abuse ([place]) 07/04/2014   Psychoactive substance abuse ([place]) 07/04/2014   Anxiety, generalized 11/24/2013   COPD with asthma 11/18/2013   Arthritis of knee, degenerative 07/15/2013   Osteoarthritis of knee, unspecified 07/15/2013    Past Surgical History:  Procedure Laterality Date   BACK SURGERY     lumbar   CATARACT EXTRACTION W/PHACO Right 01/01/2019   Procedure: CATARACT EXTRACTION PHACO AND INTRAOCULAR LENS PLACEMENT ([place]) right vision blue;  Surgeon: Marchia Meiers, MD;  Location: [facility] ORS;  Service: Ophthalmology;  Laterality: Right;  [place] 00:39.4 CDE 5.49 Fluid Pack lot # [id number]   CATARACT EXTRACTION W/PHACO Left 01/29/2019   Procedure: CATARACT EXTRACTION PHACO AND INTRAOCULAR LENS PLACEMENT ([place]) LEFT Vision Blue;  Surgeon: Marchia Meiers, MD;  Location: [facility] ORS;  Service: Ophthalmology;  Laterality: Left;  [place] 00:51.1 CDE 4.38 Fluid Pack Lot # [id number]   COLONOSCOPY WITH PROPOFOL N/A 08/07/2017   Procedure: COLONOSCOPY WITH PROPOFOL;  Surgeon: Virgel Manifold, MD;  Location: [facility] ENDOSCOPY;  Service: Endoscopy;  Laterality: N/A;   HAND SURGERY Left    fractured with pins   REPLACEMENT TOTAL KNEE Left 2016   SPINAL CORD STIMULATOR INSERTION  2014   SPINAL CORD STIMULATOR REMOVAL  2014   TUBAL LIGATION      Family History  Problem Relation Age of Onset   Hernia Mother    Heart disease Mother    OCD Mother    Diabetes Mother    Parkinson's disease Father    Bipolar disorder Sister    Schizophrenia Sister    ADD / ADHD Sister    Alcohol abuse Brother    Bipolar disorder Sister    Paranoid behavior Sister    ADD / ADHD Sister    ADD  / ADHD Son    Dementia Maternal Grandmother    Emphysema Maternal Grandfather    ADD / ADHD Son    ADD / ADHD Son    Depression Son     Social History   Tobacco Use   Smoking status: Former    Packs/day: 0.50    Years: 36.00    Total pack years: 18.00    Types: Cigarettes    Quit date: 03/13/2022    Years since quitting: 0.1    Passive exposure: Past   Smokeless tobacco: Never   Tobacco comments:    Pt using nicotine patches; 1/2 PPD as of 03/21/21  Vaping Use   Vaping Use: Never used  Substance Use Topics   Alcohol use: No    Alcohol/week: 0.0 standard drinks of alcohol   Drug use: Yes  Types: Marijuana     Allergies  Allergen Reactions   Chantix [Varenicline] Nausea Only   Glycopyrrolate Rash    Oral irritation    Health Maintenance  Topic Date Due   MAMMOGRAM  Never done   COLONOSCOPY (Pts 45-62yr Insurance coverage will need to be confirmed)  08/08/2018   COVID-19 Vaccine (4 - 2023-24 season) 05/10/2022 (Originally 10/27/2021)   Medicare Annual Wellness (AWV)  03/22/2023   PAP SMEAR-Modifier  05/02/2023   DTaP/Tdap/Td (2 - Td or Tdap) 12/12/2025   INFLUENZA VACCINE  Completed   Hepatitis C Screening  Completed   HIV Screening  Completed   Zoster Vaccines- Shingrix  Completed   HPV VACCINES  Aged Out    Chart Review Today: I personally reviewed active problem list, medication list, allergies, family history, social history, health maintenance, notes from last encounter, lab results, imaging with the patient/caregiver today.   Review of Systems  Constitutional: Negative.   HENT: Negative.    Eyes: Negative.   Respiratory: Negative.    Cardiovascular: Negative.   Gastrointestinal: Negative.   Endocrine: Negative.   Genitourinary: Negative.   Musculoskeletal: Negative.   Skin: Negative.   Allergic/Immunologic: Negative.   Neurological: Negative.   Hematological: Negative.   Psychiatric/Behavioral: Negative.    All other systems reviewed and are  negative.    Objective:   Vitals:   04/24/22 1454  BP: 124/76  Pulse: 75  Resp: 16  Temp: 98 F (36.7 C)  TempSrc: Oral  SpO2: 90%  Weight: 225 lb 11.2 oz (102.4 kg)  Height: '5\' 1"'$  (1.549 m)    Body mass index is 42.65 kg/m.  Physical Exam Vitals and nursing note reviewed.  Constitutional:      General: She is not in acute distress.    Appearance: She is well-developed. She is obese. She is not ill-appearing, toxic-appearing or diaphoretic.     Comments: Well appearing, alert  HENT:     Head: Normocephalic and atraumatic.     Nose: Nose normal.  Eyes:     General:        Right eye: No discharge.        Left eye: No discharge.     Conjunctiva/sclera: Conjunctivae normal.  Neck:     Trachea: No tracheal deviation.  Cardiovascular:     Rate and Rhythm: Normal rate and regular rhythm.  Pulmonary:     Effort: Pulmonary effort is normal. No respiratory distress.     Breath sounds: No stridor.  Skin:    General: Skin is warm and dry.     Findings: No rash.  Neurological:     Mental Status: She is alert. Mental status is at baseline.     Motor: No abnormal muscle tone.     Coordination: Coordination normal.     Gait: Gait abnormal (in WC).  Psychiatric:        Mood and Affect: Mood normal.        Behavior: Behavior normal.         Assessment & Plan:     ICD-10-CM   1. Encounter for examination following treatment at hospital  Z09    extensive review today, pt feels improved, d/c muscle relaxers, unfortunately still polypharmacy and high risk meds per specialists    2. Hyponatremia  [id number] METABOLIC PANEL WITH GFR   recheck labs    3. Polypharmacy  Z79.899    multiple psych meds, daily/chronic benzos, now with narcotic pain meds, muscle relaxers, requip  4. Long-term use of high-risk medication  Z79.899    benzo per psychiatry with multiple other psych meds, strongly encouraged pt to work on decreasing doses and freq with Dr. Su    5. Chronic  prescription benzodiazepine use  Z79.899    see above - explained to pt with pain meds + benzos much higher risk of unintentional OD    6. Avascular necrosis of right femur (HCC)  M87.051 For home use only DME Other see comment    For home use only DME Other see comment   per ortho - home PT, upcoming surgery    7. Avascular necrosis of bones of both hips (HCC)  M87.051 For home use only DME Other see comment   M87.052 For home use only DME Other see comment   same as above    8. Chronic low back pain, unspecified back pain laterality, unspecified whether sciatica present  M54.50 For home use only DME Other see comment   G89.29 For home use only DME Other see comment   working with ortho/spine, increased and severe pain for months, doing home pt, needs DME per their assessment and per surgeons recommendations    9. Unspecified abnormalities of gait and mobility  R26.9 For home use only DME Other see comment    For home use only DME Other see comment   HH PT has been working with pt and likely done assessment - we will coordinate with them for assessment/order specifics    10. Chronic obstructive pulmonary disease, unspecified COPD type ([place])  J44.9   She stopped smoking Prior severe respiratory sx - even respiratory failure Sx currently controlled with daily inhaler and albuterol prn for acute sx, no recent exacerbations   F/up in 3 months      Delsa Grana, PA-C 04/24/22 3:22 PM

## 2022-04-25 ENCOUNTER — Telehealth: Payer: Self-pay

## 2022-04-25 LAB — COMPLETE METABOLIC PANEL WITH GFR
AG Ratio: 1.3 (calc) (ref 1.0–2.5)
ALT: 12 U/L (ref 6–29)
AST: 19 U/L (ref 10–35)
Albumin: 3.6 g/dL (ref 3.6–5.1)
Alkaline phosphatase (APISO): 79 U/L (ref 37–153)
BUN: 9 mg/dL (ref 7–25)
CO2: 31 mmol/L (ref 20–32)
Calcium: 9.3 mg/dL (ref 8.6–10.4)
Chloride: 92 mmol/L — ABNORMAL LOW (ref 98–110)
Creat: 0.97 mg/dL (ref 0.50–1.05)
Globulin: 2.7 g/dL (calc) (ref 1.9–3.7)
Glucose, Bld: 88 mg/dL (ref 65–99)
Potassium: 4.4 mmol/L (ref 3.5–5.3)
Sodium: 132 mmol/L — ABNORMAL LOW (ref 135–146)
Total Bilirubin: 0.4 mg/dL (ref 0.2–1.2)
Total Protein: 6.3 g/dL (ref 6.1–8.1)
eGFR: 66 mL/min/{1.73_m2} (ref >=60)

## 2022-04-25 NOTE — Telephone Encounter (Signed)
I called Lake Bells from [place] and no answer left vm to call me back. Pt is requesting a walker and bench shower, is there any suggestions from there behalf what type of walker is needed for patient. Is this something they can order for patient or at least to know what types would work best for patient and we can submit order for patient.

## 2022-04-25 NOTE — Telephone Encounter (Signed)
I called [facility], I was told patient is no longer a patient there due to patient preference. Unable to get the information you needed for DME: walker and bench shower.

## 2022-04-26 DIAGNOSIS — Z9981 Dependence on supplemental oxygen: Secondary | ICD-10-CM | POA: Diagnosis not present

## 2022-04-26 DIAGNOSIS — J4489 Other specified chronic obstructive pulmonary disease: Secondary | ICD-10-CM | POA: Diagnosis not present

## 2022-04-26 DIAGNOSIS — G4730 Sleep apnea, unspecified: Secondary | ICD-10-CM | POA: Diagnosis not present

## 2022-04-26 DIAGNOSIS — Z9181 History of falling: Secondary | ICD-10-CM | POA: Diagnosis not present

## 2022-04-26 DIAGNOSIS — G2581 Restless legs syndrome: Secondary | ICD-10-CM | POA: Diagnosis not present

## 2022-04-26 DIAGNOSIS — M797 Fibromyalgia: Secondary | ICD-10-CM | POA: Diagnosis not present

## 2022-04-26 DIAGNOSIS — I2510 Atherosclerotic heart disease of native coronary artery without angina pectoris: Secondary | ICD-10-CM | POA: Diagnosis not present

## 2022-04-26 DIAGNOSIS — M109 Gout, unspecified: Secondary | ICD-10-CM | POA: Diagnosis not present

## 2022-04-26 DIAGNOSIS — K219 Gastro-esophageal reflux disease without esophagitis: Secondary | ICD-10-CM | POA: Diagnosis not present

## 2022-04-26 DIAGNOSIS — I252 Old myocardial infarction: Secondary | ICD-10-CM | POA: Diagnosis not present

## 2022-04-26 DIAGNOSIS — M810 Age-related osteoporosis without current pathological fracture: Secondary | ICD-10-CM | POA: Diagnosis not present

## 2022-04-26 DIAGNOSIS — M87052 Idiopathic aseptic necrosis of left femur: Secondary | ICD-10-CM | POA: Diagnosis not present

## 2022-04-26 DIAGNOSIS — R7301 Impaired fasting glucose: Secondary | ICD-10-CM | POA: Diagnosis not present

## 2022-04-26 DIAGNOSIS — G20A1 Parkinson's disease without dyskinesia, without mention of fluctuations: Secondary | ICD-10-CM | POA: Diagnosis not present

## 2022-04-26 DIAGNOSIS — G8929 Other chronic pain: Secondary | ICD-10-CM | POA: Diagnosis not present

## 2022-04-26 DIAGNOSIS — M1612 Unilateral primary osteoarthritis, left hip: Secondary | ICD-10-CM | POA: Diagnosis not present

## 2022-04-26 DIAGNOSIS — I69351 Hemiplegia and hemiparesis following cerebral infarction affecting right dominant side: Secondary | ICD-10-CM | POA: Diagnosis not present

## 2022-04-26 DIAGNOSIS — Z8614 Personal history of Methicillin resistant Staphylococcus aureus infection: Secondary | ICD-10-CM | POA: Diagnosis not present

## 2022-04-26 DIAGNOSIS — E785 Hyperlipidemia, unspecified: Secondary | ICD-10-CM | POA: Diagnosis not present

## 2022-04-26 DIAGNOSIS — Z87891 Personal history of nicotine dependence: Secondary | ICD-10-CM | POA: Diagnosis not present

## 2022-04-26 DIAGNOSIS — R278 Other lack of coordination: Secondary | ICD-10-CM | POA: Diagnosis not present

## 2022-04-26 NOTE — Telephone Encounter (Signed)
[facility] Called back saying they are going to order her a 2 wheeled walker.  He said he did not need to talk to anyone to just leave the message.

## 2022-04-26 NOTE — Telephone Encounter (Signed)
I was able to speak to Nicole Austin from [facility] he states he will get back with me once he gets a chance to talk to patient and see previous evaluation to determine which walker could be best for her.

## 2022-04-27 ENCOUNTER — Other Ambulatory Visit: Payer: Self-pay | Admitting: Orthopedic Surgery

## 2022-04-27 DIAGNOSIS — M87059 Idiopathic aseptic necrosis of unspecified femur: Secondary | ICD-10-CM | POA: Diagnosis not present

## 2022-04-30 ENCOUNTER — Telehealth: Payer: Self-pay | Admitting: Family Medicine

## 2022-04-30 ENCOUNTER — Emergency Department
Admission: EM | Admit: 2022-04-30 | Discharge: 2022-04-30 | Disposition: A | Discharge status: Home / Self Care | Payer: 59 | Attending: Emergency Medicine | Admitting: Emergency Medicine

## 2022-04-30 ENCOUNTER — Other Ambulatory Visit: Payer: Self-pay

## 2022-04-30 ENCOUNTER — Encounter: Payer: Self-pay | Admitting: Emergency Medicine

## 2022-04-30 ENCOUNTER — Telehealth: Payer: Self-pay | Admitting: *Deleted

## 2022-04-30 DIAGNOSIS — G20A1 Parkinson's disease without dyskinesia, without mention of fluctuations: Secondary | ICD-10-CM | POA: Diagnosis not present

## 2022-04-30 DIAGNOSIS — G2581 Restless legs syndrome: Secondary | ICD-10-CM | POA: Diagnosis not present

## 2022-04-30 DIAGNOSIS — I69351 Hemiplegia and hemiparesis following cerebral infarction affecting right dominant side: Secondary | ICD-10-CM | POA: Diagnosis not present

## 2022-04-30 DIAGNOSIS — K219 Gastro-esophageal reflux disease without esophagitis: Secondary | ICD-10-CM | POA: Diagnosis not present

## 2022-04-30 DIAGNOSIS — M87052 Idiopathic aseptic necrosis of left femur: Secondary | ICD-10-CM | POA: Diagnosis not present

## 2022-04-30 DIAGNOSIS — E785 Hyperlipidemia, unspecified: Secondary | ICD-10-CM | POA: Diagnosis not present

## 2022-04-30 DIAGNOSIS — M109 Gout, unspecified: Secondary | ICD-10-CM | POA: Diagnosis not present

## 2022-04-30 DIAGNOSIS — G8929 Other chronic pain: Secondary | ICD-10-CM | POA: Diagnosis not present

## 2022-04-30 DIAGNOSIS — J449 Chronic obstructive pulmonary disease, unspecified: Secondary | ICD-10-CM | POA: Insufficient documentation

## 2022-04-30 DIAGNOSIS — Z87891 Personal history of nicotine dependence: Secondary | ICD-10-CM | POA: Diagnosis not present

## 2022-04-30 DIAGNOSIS — G4730 Sleep apnea, unspecified: Secondary | ICD-10-CM | POA: Diagnosis not present

## 2022-04-30 DIAGNOSIS — M25551 Pain in right hip: Secondary | ICD-10-CM | POA: Diagnosis not present

## 2022-04-30 DIAGNOSIS — I2510 Atherosclerotic heart disease of native coronary artery without angina pectoris: Secondary | ICD-10-CM | POA: Insufficient documentation

## 2022-04-30 DIAGNOSIS — Z8673 Personal history of transient ischemic attack (TIA), and cerebral infarction without residual deficits: Secondary | ICD-10-CM | POA: Diagnosis not present

## 2022-04-30 DIAGNOSIS — M1612 Unilateral primary osteoarthritis, left hip: Secondary | ICD-10-CM | POA: Diagnosis not present

## 2022-04-30 DIAGNOSIS — Z9981 Dependence on supplemental oxygen: Secondary | ICD-10-CM | POA: Diagnosis not present

## 2022-04-30 DIAGNOSIS — Z8614 Personal history of Methicillin resistant Staphylococcus aureus infection: Secondary | ICD-10-CM | POA: Diagnosis not present

## 2022-04-30 DIAGNOSIS — M25552 Pain in left hip: Secondary | ICD-10-CM | POA: Diagnosis not present

## 2022-04-30 DIAGNOSIS — R7301 Impaired fasting glucose: Secondary | ICD-10-CM | POA: Diagnosis not present

## 2022-04-30 DIAGNOSIS — J4489 Other specified chronic obstructive pulmonary disease: Secondary | ICD-10-CM | POA: Diagnosis not present

## 2022-04-30 DIAGNOSIS — Z9181 History of falling: Secondary | ICD-10-CM | POA: Diagnosis not present

## 2022-04-30 DIAGNOSIS — I252 Old myocardial infarction: Secondary | ICD-10-CM | POA: Diagnosis not present

## 2022-04-30 DIAGNOSIS — M797 Fibromyalgia: Secondary | ICD-10-CM | POA: Diagnosis not present

## 2022-04-30 DIAGNOSIS — R278 Other lack of coordination: Secondary | ICD-10-CM | POA: Diagnosis not present

## 2022-04-30 DIAGNOSIS — I10 Essential (primary) hypertension: Secondary | ICD-10-CM | POA: Diagnosis not present

## 2022-04-30 DIAGNOSIS — M810 Age-related osteoporosis without current pathological fracture: Secondary | ICD-10-CM | POA: Diagnosis not present

## 2022-04-30 MED ORDER — OXYCODONE HCL 10 MG PO TABS: 10.0000 mg | ORAL_TABLET | Freq: Four times a day (QID) | ORAL | 0 refills | Status: AC | PRN

## 2022-04-30 MED ORDER — OXYCODONE HCL 5 MG PO TABS: 10.0000 mg | ORAL_TABLET | Freq: Four times a day (QID) | ORAL | 0 refills | Status: DC | PRN

## 2022-04-30 MED ORDER — OXYCODONE HCL 5 MG PO TABS
10.0000 mg | ORAL_TABLET | Freq: Once | ORAL | Status: AC
  Administered 2022-04-30: 10 mg via ORAL
  Filled 2022-04-30: qty 2

## 2022-04-30 NOTE — Discharge Instructions (Signed)
Take Tylenol 650 mg every 6 hours for pain.  Use muscle rub as prescribed.  If you have severe pain despite these medications you may take oxycodone as prescribed.  Pedis.

## 2022-04-30 NOTE — Telephone Encounter (Signed)
Pt called saying they put off her total hip replacement until may.  Pt states she is in a lot of pain and wants to know if she could get some pain medication.    [place]  [phone]

## 2022-04-30 NOTE — ED Provider Notes (Signed)
[facility] Provider Note    Event Date/Time   First MD Initiated Contact with Patient 04/30/22 1820     (approximate)   History   Hip Pain   HPI  Nicole Austin is a 62 y.o. female   Past medical history of a number of medical comorbidities including CAD, stroke, sleep apnea, COPD, chronic hip pain scheduled for total hip replacement who presents to the emergency department for medication request that she ran out of her oxycodone when she switched orthopedics provider.  She has an upcoming surgery for later this month.  With her oxycodone she is able to ambulate with a walker.  Since running out on Friday she is unable to walk.  She had no new trauma.  No new quality or or location of pain.  No other acute medical complaints.   External Medical Documents Reviewed: Telephone encounters from the end of February 2024 documenting her chronic hip pain and need for walker      Physical Exam   Triage Vital Signs: ED Triage Vitals  Enc Vitals Group     BP 04/30/22 1731 135/60     Pulse Rate 04/30/22 1731 65     Resp 04/30/22 1731 18     Temp 04/30/22 1731 98.3 F (36.8 C)     Temp Source 04/30/22 1731 Oral     SpO2 04/30/22 1731 96 %     Weight 04/30/22 1732 224 lb (101.6 kg)     Height 04/30/22 1732 '5\' 1"'$  (1.549 m)     Head Circumference --      Peak Flow --      Pain Score 04/30/22 1731 10     Pain Loc --      Pain Edu? --      Excl. in [place]? --     Most recent vital signs: Vitals:   04/30/22 1731  BP: 135/60  Pulse: 65  Resp: 18  Temp: 98.3 F (36.8 C)  SpO2: 96%    General: Awake, no distress.  CV:  Good peripheral perfusion.  Resp:  Normal effort.  Abd:  No distention.  Other:  neuroVascular intact to both bilateral lower extremities otherwise appears well normal vital signs no signs of injury.   ED Results / Procedures / Treatments   Labs (all labs ordered are listed, but only abnormal results are displayed) Labs  Reviewed - No data to display   PROCEDURES:  Critical Care performed: No  Procedures   MEDICATIONS ORDERED IN ED: Medications  oxyCODONE (Oxy IR/ROXICODONE) immediate release tablet 10 mg (10 mg Oral Given 04/30/22 1903)    IMPRESSION / MDM / ASSESSMENT AND PLAN / ED COURSE  I reviewed the triage vital signs and the nursing notes.                                Patient's presentation is most consistent with acute presentation with potential threat to life or bodily function.  Differential diagnosis includes, but is not limited to, chronic hip pain, arthritis, -- acute fracture dislocation septic joint less likely   MDM: I reviewed this patient's medical dispense history and noted oxycodone prescribed for 5-day courses over the left last month of February.  This last course ended several days ago.  She is scheduled for total hip replacement later this month.  I will prescribe her oxycodone to last until her next appointment later this week.  I will also encourage her to use multimodal pain control including topicals, and acetaminophen.         FINAL CLINICAL IMPRESSION(S) / ED DIAGNOSES   Final diagnoses:  Chronic pain of both hips     Rx / DC Orders   ED Discharge Orders          Ordered    oxyCODONE (ROXICODONE) 5 MG immediate release tablet  Every 6 hours PRN        04/30/22 1905             Note:  This document was prepared using Dragon voice recognition software and may include unintentional dictation errors.    Lucillie Garfinkel, MD 04/30/22 Drema Halon

## 2022-04-30 NOTE — Telephone Encounter (Unsigned)
Copied from [place] [id number]. Topic: General - Other >> Apr 30, 2022  1:54 PM Chapman Fitch wrote: Reason for CRM: [facility] called to get an update on the fax they sent on 2.5.24/ please advise ./ they will fax it again today

## 2022-04-30 NOTE — Telephone Encounter (Signed)
Result note read to pt, verbalizes understanding.  "Please let the patient know that her complete metabolic panel did show improvement in some of her labs particularly her kidney function and hydration status looks better Her glucose was normal Low calcium improved and is now normal She continues to have a little bit low sodium and chloride But everything else was good and reassuring     Please also update her that we are working with the home health PT and waiting for them to get back to [place] on which devices are needed based on their prior assessments  -we should have an update for her soon."

## 2022-04-30 NOTE — ED Notes (Signed)
First nurse-pt brought in via ems from home with pain management.  Pt out of oxycodone for 3 days.  Pt has left hip pain and is scheduled for procedure.  Pt in recliner in lobby  pt alert  bp 170/110  fsbs 88.  Pt alert.

## 2022-04-30 NOTE — ED Triage Notes (Signed)
Pt in via EMS for chronic hip pain. Pt is scheduled to have a total hip replacement in may. Pt is unable to walk even with her walker which she had been able to do prior.

## 2022-05-01 ENCOUNTER — Encounter: Payer: Self-pay | Admitting: Family Medicine

## 2022-05-01 NOTE — Telephone Encounter (Signed)
Pt notified- verbalized understanding.

## 2022-05-01 NOTE — Assessment & Plan Note (Signed)
She stopped smoking Prior severe respiratory sx - even respiratory failure Sx currently controlled with daily inhaler and albuterol prn for acute sx, no recent exacerbations

## 2022-05-01 NOTE — Progress Notes (Signed)
Patient ID: Nicole Austin, female    DOB: 1960/09/23, 62 y.o.   MRN: [id number]  PCP: Delsa Grana, PA-C  No chief complaint on file.   Subjective:   Nicole Austin is a 62 y.o. female, presents to clinic with CC of the following:  HPI  Pt presents for preop clearance eval 05/17/2022 surgery with   Patient Active Problem List   Diagnosis Date Noted   Polypharmacy 04/09/2022   Sinus bradycardia 04/09/2022   Depression with anxiety 04/09/2022   AVN of femur ([place]) 03/21/2022   MRSA (methicillin resistant staph aureus) culture positive 05/04/2021   Hyperglycemia 01/01/2021   Prolonged QT interval 01/01/2021   Benign neoplasm of ascending colon    Polyp of sigmoid colon    Bipolar disorder with depression ([place]) 02/25/2017   ADD (attention deficit disorder) 02/25/2017   Marijuana use 02/13/2017   Neuropathy, peripheral 05/07/2016   Vitamin B12 deficiency 10/24/2015   Fibromyalgia 08/30/2015   Low HDL (under 40) 08/30/2015   GERD (gastroesophageal reflux disease) 08/30/2015   OP (osteoporosis) 06/30/2015   Sleep apnea 06/30/2015   HLD (hyperlipidemia) 05/13/2015   Morbid (severe) obesity due to excess calories ([place]) 08/16/2014   Low back pain with sciatica 08/04/2014   Current tobacco use 08/04/2014   Anxiety, generalized 11/24/2013   COPD (chronic obstructive pulmonary disease) ([place]) 11/18/2013   Arthritis of knee, degenerative 07/15/2013   Osteoarthritis of knee, unspecified 07/15/2013      Current Outpatient Medications:    acetaminophen (TYLENOL) 500 MG tablet, Take 2 tablets (1,000 mg total) by mouth every 8 (eight) hours as needed., Disp: 90 tablet, Rfl: 0   albuterol (VENTOLIN HFA) 108 (90 Base) MCG/ACT inhaler, Inhale 2 puffs into the lungs every 4 (four) hours as needed for wheezing or shortness of breath., Disp: 18 g, Rfl: 2   ALPRAZolam (XANAX) 0.5 MG tablet, Take 1 tablet (0.5 mg total) by mouth 2 (two) times daily., Disp: , Rfl: 0    atorvastatin (LIPITOR) 40 MG tablet, TAKE 1 TABLET BY MOUTH EVERY NIGHT AT BEDTIME, Disp: 90 tablet, Rfl: 3   divalproex (DEPAKOTE) 250 MG DR tablet, Take 1 tablet (250 mg total) by mouth 2 (two) times daily., Disp: 60 tablet, Rfl: 2   escitalopram (LEXAPRO) 20 MG tablet, Take 1 tablet (20 mg total) by mouth daily., Disp: 30 tablet, Rfl: 2   lurasidone (LATUDA) 80 MG TABS tablet, Take 1 tablet (80 mg total) by mouth daily with breakfast., Disp: 90 tablet, Rfl: 0   nystatin (MYCOSTATIN/NYSTOP) powder, Apply 1 application. topically 3 (three) times daily as needed (rash in skin folds)., Disp: 60 g, Rfl: 1   nystatin-triamcinolone ointment (MYCOLOG), Apply 1 Application topically 2 (two) times daily., Disp: 30 g, Rfl: 0   oxyCODONE 10 MG TABS, Take 1 tablet (10 mg total) by mouth every 6 (six) hours as needed for up to 8 days for severe pain or breakthrough pain., Disp: 24 tablet, Rfl: 0   OXYGEN, Inhale 2 L into the lungs as needed., Disp: , Rfl:    rivaroxaban (XARELTO) 10 MG TABS tablet, Take 10 mg by mouth daily., Disp: , Rfl:    rOPINIRole (REQUIP XL) 4 MG 24 hr tablet, Take 4 mg by mouth at bedtime., Disp: , Rfl:    senna-docusate (SENOKOT-S) 8.6-50 MG tablet, Take 1 tablet by mouth 2 (two) times daily., Disp: 100 tablet, Rfl: 0   Tiotropium Bromide Monohydrate (SPIRIVA RESPIMAT) 2.5 MCG/ACT AERS, Inhale 2 puffs into the lungs  daily., Disp: 1 each, Rfl: 0   Allergies  Allergen Reactions   Chantix [Varenicline] Nausea Only   Glycopyrrolate Rash    Oral irritation     Social History   Tobacco Use   Smoking status: Former    Packs/day: 0.50    Years: 36.00    Total pack years: 18.00    Types: Cigarettes    Quit date: 03/13/2022    Years since quitting: 0.1    Passive exposure: Past   Smokeless tobacco: Never   Tobacco comments:    Pt using nicotine patches; 1/2 PPD as of 03/21/21  Vaping Use   Vaping Use: Never used  Substance Use Topics   Alcohol use: No    Alcohol/week: 0.0  standard drinks of alcohol   Drug use: Yes    Types: Marijuana      Chart Review Today:   Review of Systems     Objective:   There were no vitals filed for this visit.  There is no height or weight on file to calculate BMI.  Physical Exam   Results for orders placed or performed in visit on 05/01/22  Hemoglobin A1c  Result Value Ref Range   Hemoglobin A1C 4.1   HM DIABETES FOOT EXAM  Result Value Ref Range   HM Diabetic Foot Exam Bialteral Normal        Assessment & Plan:   1. Encounter for preoperative assessment  - Urinalysis, Routine w reflex microscopic - Drugs of abuse screen w/o alc, rtn urine-sln - DG Chest 2 View; Future  2. Chronic obstructive pulmonary disease, unspecified COPD type ([place])  - DG Chest 2 View; Future  3. Sleep apnea, unspecified type  - DG Chest 2 View; Future  4. Prolonged QT interval   5. Polypharmacy  - Urinalysis, Routine w reflex microscopic - Drugs of abuse screen w/o alc, rtn urine-sln  6. Depression with anxiety   7. Chronic prescription benzodiazepine use  - Urinalysis, Routine w reflex microscopic - Drugs of abuse screen w/o alc, rtn urine-sln  8. Long-term use of high-risk medication  - Urinalysis, Routine w reflex microscopic - Drugs of abuse screen w/o alc, rtn urine-sln  9. Hyponatremia  - COMPLETE METABOLIC PANEL WITH GFR      Delsa Grana, PA-C 05/01/22 9:24 PM

## 2022-05-02 ENCOUNTER — Ambulatory Visit: Payer: Self-pay

## 2022-05-02 NOTE — Chronic Care Management (AMB) (Signed)
   05/02/2022  Nicole Austin Aug 04, 1960 [id number]   Reason for Encounter: Change in CCM enrollment status   Horris Latino RN Care Manager/Chronic Care Management [phone]

## 2022-05-03 ENCOUNTER — Ambulatory Visit: Payer: 59 | Admitting: Family Medicine

## 2022-05-03 ENCOUNTER — Ambulatory Visit (INDEPENDENT_AMBULATORY_CARE_PROVIDER_SITE_OTHER): Payer: 59 | Admitting: Family Medicine

## 2022-05-03 DIAGNOSIS — J449 Chronic obstructive pulmonary disease, unspecified: Secondary | ICD-10-CM

## 2022-05-03 DIAGNOSIS — Z01818 Encounter for other preprocedural examination: Secondary | ICD-10-CM

## 2022-05-03 DIAGNOSIS — F418 Other specified anxiety disorders: Secondary | ICD-10-CM

## 2022-05-03 DIAGNOSIS — M87052 Idiopathic aseptic necrosis of left femur: Secondary | ICD-10-CM | POA: Insufficient documentation

## 2022-05-03 DIAGNOSIS — R9431 Abnormal electrocardiogram [ECG] [EKG]: Secondary | ICD-10-CM

## 2022-05-03 DIAGNOSIS — R001 Bradycardia, unspecified: Secondary | ICD-10-CM

## 2022-05-03 DIAGNOSIS — E871 Hypo-osmolality and hyponatremia: Secondary | ICD-10-CM

## 2022-05-03 DIAGNOSIS — M87051 Idiopathic aseptic necrosis of right femur: Secondary | ICD-10-CM | POA: Insufficient documentation

## 2022-05-03 DIAGNOSIS — Z8614 Personal history of Methicillin resistant Staphylococcus aureus infection: Secondary | ICD-10-CM | POA: Insufficient documentation

## 2022-05-03 DIAGNOSIS — Z79899 Other long term (current) drug therapy: Secondary | ICD-10-CM | POA: Insufficient documentation

## 2022-05-03 DIAGNOSIS — G4730 Sleep apnea, unspecified: Secondary | ICD-10-CM

## 2022-05-03 NOTE — Telephone Encounter (Unsigned)
Copied from [place] [id number]. Topic: General - Other >> May 03, 2022  3:24 PM Dominique A wrote: Reason for CRM: Pt is requesting to speak with PCP. She is wanting to check on the status of her walker. Per pt she is really in need of a walker. Please call pt back.

## 2022-05-03 NOTE — Progress Notes (Deleted)
Patient ID: Nicole Austin, female    DOB: [birth year], 62 y.o.   MRN: [id number]  PCP: Delsa Grana, PA-C  No chief complaint on file.   Subjective:   Nicole Austin is a 62 y.o. female, presents to clinic with CC of the following:  HPI  Pt presents for preop clearance eval 05/17/2022 surgery [facility] for left total hip   Patient Active Problem List   Diagnosis Date Noted   Polypharmacy 04/09/2022   Sinus bradycardia 04/09/2022   Depression with anxiety 04/09/2022   AVN of femur ([place]) 03/21/2022   MRSA (methicillin resistant staph aureus) culture positive 05/04/2021   Prolonged QT interval 01/01/2021   Benign neoplasm of ascending colon    Polyp of sigmoid colon    Bipolar disorder with depression ([place]) 02/25/2017   ADD (attention deficit disorder) 02/25/2017   Marijuana use 02/13/2017   Neuropathy, peripheral 05/07/2016   Vitamin B12 deficiency 10/24/2015   Fibromyalgia 08/30/2015   GERD (gastroesophageal reflux disease) 08/30/2015   OP (osteoporosis) 06/30/2015   Sleep apnea 06/30/2015   HLD (hyperlipidemia) 05/13/2015   Morbid (severe) obesity due to excess calories ([place]) 08/16/2014   Low back pain with sciatica 08/04/2014   Anxiety, generalized 11/24/2013   COPD (chronic obstructive pulmonary disease) ([place]) 11/18/2013   Arthritis of knee, degenerative 07/15/2013   Osteoarthritis of knee, unspecified 07/15/2013      Current Outpatient Medications:    acetaminophen (TYLENOL) 500 MG tablet, Take 2 tablets (1,000 mg total) by mouth every 8 (eight) hours as needed., Disp: 90 tablet, Rfl: 0   albuterol (VENTOLIN HFA) 108 (90 Base) MCG/ACT inhaler, Inhale 2 puffs into the lungs every 4 (four) hours as needed for wheezing or shortness of breath., Disp: 18 g, Rfl: 2   ALPRAZolam (XANAX) 0.5 MG tablet, Take 1 tablet (0.5 mg total) by mouth 2 (two) times daily., Disp: , Rfl: 0   atorvastatin (LIPITOR) 40 MG tablet, TAKE 1 TABLET BY MOUTH EVERY  NIGHT AT BEDTIME, Disp: 90 tablet, Rfl: 3   divalproex (DEPAKOTE) 250 MG DR tablet, Take 1 tablet (250 mg total) by mouth 2 (two) times daily., Disp: 60 tablet, Rfl: 2   escitalopram (LEXAPRO) 20 MG tablet, Take 1 tablet (20 mg total) by mouth daily., Disp: 30 tablet, Rfl: 2   lurasidone (LATUDA) 80 MG TABS tablet, Take 1 tablet (80 mg total) by mouth daily with breakfast., Disp: 90 tablet, Rfl: 0   nystatin (MYCOSTATIN/NYSTOP) powder, Apply 1 application. topically 3 (three) times daily as needed (rash in skin folds)., Disp: 60 g, Rfl: 1   nystatin-triamcinolone ointment (MYCOLOG), Apply 1 Application topically 2 (two) times daily., Disp: 30 g, Rfl: 0   oxyCODONE 10 MG TABS, Take 1 tablet (10 mg total) by mouth every 6 (six) hours as needed for up to 8 days for severe pain or breakthrough pain., Disp: 24 tablet, Rfl: 0   OXYGEN, Inhale 2 L into the lungs as needed., Disp: , Rfl:    rivaroxaban (XARELTO) 10 MG TABS tablet, Take 10 mg by mouth daily., Disp: , Rfl:    rOPINIRole (REQUIP XL) 4 MG 24 hr tablet, Take 4 mg by mouth at bedtime., Disp: , Rfl:    senna-docusate (SENOKOT-S) 8.6-50 MG tablet, Take 1 tablet by mouth 2 (two) times daily., Disp: 100 tablet, Rfl: 0   Tiotropium Bromide Monohydrate (SPIRIVA RESPIMAT) 2.5 MCG/ACT AERS, Inhale 2 puffs into the lungs daily., Disp: 1 each, Rfl: 0   Allergies  Allergen Reactions   Chantix [Varenicline] Nausea Only   Glycopyrrolate Rash    Oral irritation     Social History   Tobacco Use   Smoking status: Former    Packs/day: 0.50    Years: 36.00    Total pack years: 18.00    Types: Cigarettes    Quit date: 03/13/2022    Years since quitting: 0.1    Passive exposure: Past   Smokeless tobacco: Never   Tobacco comments:    Pt using nicotine patches; 1/2 PPD as of 03/21/21  Vaping Use   Vaping Use: Never used  Substance Use Topics   Alcohol use: No    Alcohol/week: 0.0 standard drinks of alcohol   Drug use: Yes    Types: Marijuana       Chart Review Today: ***  Review of Systems     Objective:   There were no vitals filed for this visit.  There is no height or weight on file to calculate BMI.  Physical Exam   Results for orders placed or performed in visit on 05/01/22  Hemoglobin A1c  Result Value Ref Range   Hemoglobin A1C 4.1   HM DIABETES FOOT EXAM  Result Value Ref Range   HM Diabetic Foot Exam Bialteral Normal        Assessment & Plan:   ***  EKG and CXR done with recent hospital visits/admission 2/12 Labs also recently done Hemoglobin  Date Value Ref Range Status  04/10/2022 12.4 12.0 - 15.0 g/dL Final  04/09/2022 12.8 12.0 - 15.0 g/dL Final  03/13/2022 12.9 12.0 - 15.0 g/dL Final  03/08/2022 12.9 12.0 - 15.0 g/dL Final   HGB  Date Value Ref Range Status  06/15/2014 10.9 (L) 12.0 - 16.0 g/dL Final  06/14/2014 13.0 12.0 - 16.0 g/dL Final  10/20/2013 13.8 12.0 - 16.0 g/dL Final  06/05/2013 10.4 (L) 12.0 - 16.0 g/dL Final   Lab Results  Component Value Date   CREATININE 0.97 04/24/2022   Lab Results  Component Value Date   BUN 9 04/24/2022   Lab Results  Component Value Date   GFRNONAA 52 (L) 04/10/2022   Lab Results  Component Value Date   HGBA1C 4.1 04/14/2022   Pt est with her current surgeon on 03/30/2022: She was scheduled for surgery with a different surgeon (Dr. Harlow Mares) but the morning of surgery took her meds incorrectly and surgery was cancelled and surgeon no longer able to operate. Dr. Harlow Mares Rx for pain meds only on 1/22 and 1/29 (tramadol both times while pt was getting oxy from another prescriber)  Dr. Caryn Bee - surgery on 3/21  Assessment: Left hip avascular necrosis  Plan: Nicole Austin is a 62year old female who presents with left hip avascular necrosis with subchondral collapse and focal right hip AVN which appears chronic in nature with no right hip symptoms at this time. Based upon the patient's continued symptoms and failure to respond to  conservative treatments and the severe nature of her pain and AVN in the left hip, I have recommended a left total hip replacement for this patient. A long discussion took place with the patient describing what a total joint replacement is and what the procedure would entail. A hip model, similar to the implants that will be used during the operation, was utilized to demonstrate the implants. Choices of implant manufactures were discussed and reviewed. The ability to secure the implant utilizing cement or cementless (press fit) fixation was discussed. Anterior and posterior  exposures were discussed. For this patient an appropriate approach will be anterior.  The hospitalization and post-operative care and rehabilitation were also discussed. The use of perioperative antibiotics and DVT prophylaxis were discussed. The risk, benefits and alternatives to a surgical intervention were discussed at length with the patient. The patient was also advised of risks related to the medical comorbidities and elevated body mass index (BMI). A lengthy discussion took place to review the most common complications including but not limited to: deep vein thrombosis, pulmonary embolus, heart attack, stroke, infection, wound breakdown, heterotopic ossification, dislocation, numbness, leg length in-equality, intraoperative fracture, damage to nerves, tendon,muscles, arteries or other blood vessels, death and other possible complications from anesthesia. The patient was told that we will take steps to minimize these risks by using sterile technique, antibiotics and DVT prophylaxis when appropriate and follow the patient postoperatively in the office setting to monitor progress. The possibility of recurrent pain, no improvement in pain and actual worsening of pain were also discussed with the patient. The risk of dislocation following total hip replacement was discussed and potential precautions to prevent dislocation were  reviewed.  The discharge plan of care focused on the patient going home following surgery. The patient was encouraged to make the necessary arrangements to have someone stay with them when they are discharged home.  The benefits of surgery were discussed with the patient including the potential for improving the patient's current clinical condition through operative intervention. Alternatives to surgical intervention including continued conservative management were also discussed in detail. All questions were answered to the satisfaction of the patient. The patient participated and agreed to the plan of care as well as the use of the recommended implants for their total hip replacement surgery. An information packet was given to the patient to review prior to surgery. We specifically had a conversation about her weight and her increased risks regarding her BMI she has lost weight and will continue losing weight and I advised the patient that we will only proceed if she has continued to lose weight at her next preoperative visit prior to surgery. We also discussed that she will need to maintain her smoking cessation for at least 6 weeks prior to surgery and she will be tested for nicotine prior to proceeding with surgery. We will obtain new films with a marker ball at her presurgical visit for templating purposes. Patient will need medical clearance for the surgery. All questions answered and the patient agrees with the above plan to proceed with preparation for a left anterior total hip replacement.  Portions of this record have been created using [employer]. Dictation errors have been sought, but may not have been identified and corrected.  Steffanie Rainwater MD  Electronically signed by Dutch Gray, MD at 03/30/2022 3:21 PM EST    Delsa Grana, PA-C 05/03/22 2:13 PM

## 2022-05-03 NOTE — Telephone Encounter (Signed)
Spoke to patient already per [facility] PT he was going to take care of it. Pt verbalized understanding

## 2022-05-05 ENCOUNTER — Encounter (HOSPITAL_COMMUNITY): Payer: Self-pay | Admitting: Urgent Care

## 2022-05-05 DIAGNOSIS — J441 Chronic obstructive pulmonary disease with (acute) exacerbation: Secondary | ICD-10-CM | POA: Diagnosis not present

## 2022-05-05 DIAGNOSIS — J449 Chronic obstructive pulmonary disease, unspecified: Secondary | ICD-10-CM | POA: Diagnosis not present

## 2022-05-07 ENCOUNTER — Ambulatory Visit: Payer: 59 | Admitting: Family Medicine

## 2022-05-07 ENCOUNTER — Other Ambulatory Visit: Payer: Self-pay

## 2022-05-07 ENCOUNTER — Encounter
Admission: RE | Admit: 2022-05-07 | Discharge: 2022-05-07 | Disposition: A | Discharge status: Home / Self Care | Payer: 59 | Source: Ambulatory Visit | Attending: Orthopedic Surgery | Admitting: Orthopedic Surgery

## 2022-05-07 VITALS — BP 139/74 | HR 71 | Temp 97.9°F | Resp 18 | Ht 61.0 in | Wt 220.0 lb

## 2022-05-07 DIAGNOSIS — Z01818 Encounter for other preprocedural examination: Secondary | ICD-10-CM | POA: Insufficient documentation

## 2022-05-07 DIAGNOSIS — Z7901 Long term (current) use of anticoagulants: Secondary | ICD-10-CM | POA: Insufficient documentation

## 2022-05-07 DIAGNOSIS — J449 Chronic obstructive pulmonary disease, unspecified: Secondary | ICD-10-CM | POA: Diagnosis not present

## 2022-05-07 DIAGNOSIS — Z01810 Encounter for preprocedural cardiovascular examination: Secondary | ICD-10-CM | POA: Diagnosis not present

## 2022-05-07 DIAGNOSIS — E871 Hypo-osmolality and hyponatremia: Secondary | ICD-10-CM | POA: Diagnosis not present

## 2022-05-07 DIAGNOSIS — J441 Chronic obstructive pulmonary disease with (acute) exacerbation: Secondary | ICD-10-CM | POA: Diagnosis not present

## 2022-05-07 DIAGNOSIS — Z22322 Carrier or suspected carrier of Methicillin resistant Staphylococcus aureus: Secondary | ICD-10-CM | POA: Diagnosis not present

## 2022-05-07 DIAGNOSIS — Z79899 Other long term (current) drug therapy: Secondary | ICD-10-CM | POA: Diagnosis not present

## 2022-05-07 LAB — CBC WITH DIFFERENTIAL/PLATELET
Abs Immature Granulocytes: 0.01 10*3/uL (ref 0.00–0.07)
Basophils Absolute: 0.1 10*3/uL (ref 0.0–0.1)
Basophils Relative: 1 %
Eosinophils Absolute: 0.3 10*3/uL (ref 0.0–0.5)
Eosinophils Relative: 3 %
HCT: 36.7 % (ref 36.0–46.0)
Hemoglobin: 12.3 g/dL (ref 12.0–15.0)
Immature Granulocytes: 0 %
Lymphocytes Relative: 25 %
Lymphs Abs: 1.8 10*3/uL (ref 0.7–4.0)
MCH: 29 pg (ref 26.0–34.0)
MCHC: 33.5 g/dL (ref 30.0–36.0)
MCV: 86.6 fL (ref 80.0–100.0)
Monocytes Absolute: 0.7 10*3/uL (ref 0.1–1.0)
Monocytes Relative: 10 %
Neutro Abs: 4.5 10*3/uL (ref 1.7–7.7)
Neutrophils Relative %: 61 %
Platelets: 250 10*3/uL (ref 150–400)
RBC: 4.24 MIL/uL (ref 3.87–5.11)
RDW: 14 % (ref 11.5–15.5)
WBC: 7.4 10*3/uL (ref 4.0–10.5)
nRBC: 0 % (ref 0.0–0.2)

## 2022-05-07 LAB — URINALYSIS, ROUTINE W REFLEX MICROSCOPIC
Bilirubin Urine: NEGATIVE
Glucose, UA: NEGATIVE mg/dL
Hgb urine dipstick: NEGATIVE
Ketones, ur: NEGATIVE mg/dL
Leukocytes,Ua: NEGATIVE
Nitrite: NEGATIVE
Protein, ur: NEGATIVE mg/dL
Specific Gravity, Urine: 1.011 (ref 1.005–1.030)
pH: 7 (ref 5.0–8.0)

## 2022-05-07 LAB — TYPE AND SCREEN
ABO/RH(D): O POS
Antibody Screen: NEGATIVE

## 2022-05-07 LAB — COMPREHENSIVE METABOLIC PANEL
ALT: 10 U/L (ref 0–44)
AST: 15 U/L (ref 15–41)
Albumin: 3.4 g/dL — ABNORMAL LOW (ref 3.5–5.0)
Alkaline Phosphatase: 66 U/L (ref 38–126)
Anion gap: 8 (ref 5–15)
BUN: 6 mg/dL — ABNORMAL LOW (ref 8–23)
CO2: 27 mmol/L (ref 22–32)
Calcium: 8.8 mg/dL — ABNORMAL LOW (ref 8.9–10.3)
Chloride: 90 mmol/L — ABNORMAL LOW (ref 98–111)
Creatinine, Ser: 0.81 mg/dL (ref 0.44–1.00)
GFR, Estimated: >60 mL/min (ref >60)
Glucose, Bld: 78 mg/dL (ref 70–99)
Potassium: 4 mmol/L (ref 3.5–5.1)
Sodium: 125 mmol/L — ABNORMAL LOW (ref 135–145)
Total Bilirubin: 0.5 mg/dL (ref 0.3–1.2)
Total Protein: 6.6 g/dL (ref 6.5–8.1)

## 2022-05-07 MED ORDER — MUPIROCIN 2 % EX OINT: TOPICAL_OINTMENT | CUTANEOUS | 0 refills | Status: DC

## 2022-05-07 NOTE — Patient Instructions (Addendum)
Your procedure is scheduled on: Thursday May 17, 2022. Report to the Registration Desk on the 1st floor of the [facility]. To find out your arrival time, please call [phone] between 1PM - 3PM on: Wednesday May 16, 2022. If your arrival time is 6:00 am, do not arrive before that time as the [facility] entrance doors do not open until 6:00 am.  REMEMBER: Instructions that are not followed completely may result in serious medical risk, up to and including death; or upon the discretion of your surgeon and anesthesiologist your surgery may need to be rescheduled.  Do not eat food after midnight the night before surgery.  No gum chewing or hard candies.  You may however, drink CLEAR liquids up to 2 hours before you are scheduled to arrive for your surgery. Do not drink anything within 2 hours of your scheduled arrival time.  Clear liquids include: - water  - apple juice without pulp - gatorade (not RED colors) - black coffee or tea (Do NOT add milk or creamers to the coffee or tea) Do NOT drink anything that is not on this list.   In addition, your doctor has ordered for you to drink the provided:  Ensure Pre-Surgery Clear Carbohydrate Drink  Gatorade G2 Drinking this carbohydrate drink up to two hours before surgery helps to reduce insulin resistance and improve patient outcomes. Please complete drinking 2 hours before scheduled arrival time.  One week prior to surgery: Stop Anti-inflammatories (NSAIDS) such as Advil, Aleve, Ibuprofen, Motrin, Naproxen, Naprosyn and Aspirin based products such as Excedrin, Goody's Powder, BC Powder. Stop ANY OVER THE COUNTER supplements until after surgery. You may however, continue to take Tylenol if needed for pain up until the day of surgery.  Continue taking all prescribed medications with the exception of the following:   Follow recommendations from Cardiologist or PCP regarding stopping blood thinners.  TAKE ONLY THESE MEDICATIONS  THE MORNING OF SURGERY WITH A SIP OF WATER:  ALPRAZolam (XANAX) 0.5 MG  divalproex (DEPAKOTE) 250 MG  escitalopram (LEXAPRO) 20 MG  oxyCODONE 10 MG    Antacid (take one the night before and one on the morning of surgery - helps to prevent nausea after surgery.)  Use inhalers on the day of surgery and bring to the hospital. Tiotropium Bromide Monohydrate (SPIRIVA RESPIMAT) 2.5 MCG/ACT AERS  albuterol (VENTOLIN HFA) 108 (90 Base) MCG/ACT inhaler   No Alcohol for 24 hours before or after surgery.  No Smoking including e-cigarettes for 24 hours before surgery.  No chewable tobacco products for at least 6 hours before surgery.  No nicotine patches on the day of surgery.  Do not use any "recreational" drugs for at least a week (preferably 2 weeks) before your surgery.  Please be advised that the combination of cocaine and anesthesia may have negative outcomes, up to and including death. If you test positive for cocaine, your surgery will be cancelled.  On the morning of surgery brush your teeth with toothpaste and water, you may rinse your mouth with mouthwash if you wish. Do not swallow any toothpaste or mouthwash.  Use CHG Soap or wipes as directed on instruction sheet.  Do not wear jewelry, make-up, hairpins, clips or nail polish.  Do not wear lotions, powders, or perfumes.   Do not shave body hair from the neck down 48 hours before surgery.  Contact lenses, hearing aids and dentures may not be worn into surgery.  Do not bring valuables to the hospital. [facility] is  not responsible for any missing/lost belongings or valuables.   Total Shoulder Arthroplasty:  use Benzoyl Peroxide 5% Gel as directed on instruction sheet.  Bring your C-PAP to the hospital in case you may have to spend the night.   Notify your doctor if there is any change in your medical condition (cold, fever, infection).  Wear comfortable clothing (specific to your surgery type) to the hospital.  After  surgery, you can help prevent lung complications by doing breathing exercises.  Take deep breaths and cough every 1-2 hours. Your doctor may order a device called an Incentive Spirometer to help you take deep breaths. When coughing or sneezing, hold a pillow firmly against your incision with both hands. This is called "splinting." Doing this helps protect your incision. It also decreases belly discomfort.  If you are being admitted to the hospital overnight, leave your suitcase in the car. After surgery it may be brought to your room.  In case of increased patient census, it may be necessary for you, the patient, to continue your postoperative care in the Same Day Surgery department.  If you are being discharged the day of surgery, you will not be allowed to drive home. You will need a responsible individual to drive you home and stay with you for 24 hours after surgery.   If you are taking public transportation, you will need to have a responsible individual with you.  Please call the [facility]. at [phone] if you have any questions about these instructions.  Surgery Visitation Policy:  Patients undergoing a surgery or procedure may have two family members or support persons with them as long as the person is not COVID-19 positive or experiencing its symptoms.   Inpatient Visitation:    Visiting hours are 7 a.m. to 8 p.m. Up to four visitors are allowed at one time in a patient room. The visitors may rotate out with other people during the day. One designated support person (adult) may remain overnight.  Due to an increase in RSV and influenza rates and associated hospitalizations, children ages 26 and under will not be able to visit patients in [facility]. Masks continue to be strongly recommended.    Preparing for Surgery with CHLORHEXIDINE GLUCONATE (CHG) Soap  Chlorhexidine Gluconate (CHG) Soap  o An antiseptic cleaner that kills germs and  bonds with the skin to continue killing germs even after washing  o Used for showering the night before surgery and morning of surgery  Before surgery, you can play an important role by reducing the number of germs on your skin.  CHG (Chlorhexidine gluconate) soap is an antiseptic cleanser which kills germs and bonds with the skin to continue killing germs even after washing.  Please do not use if you have an allergy to CHG or antibacterial soaps. If your skin becomes reddened/irritated stop using the CHG.  1. Shower the NIGHT BEFORE SURGERY and the MORNING OF SURGERY with CHG soap.  2. If you choose to wash your hair, wash your hair first as usual with your normal shampoo.  3. After shampooing, rinse your hair and body thoroughly to remove the shampoo.  4. Use CHG as you would any other liquid soap. You can apply CHG directly to the skin and wash gently with a scrungie or a clean washcloth.  5. Apply the CHG soap to your body only from the neck down. Do not use on open wounds or open sores. Avoid contact with your eyes, ears, mouth,  and genitals (private parts). Wash face and genitals (private parts) with your normal soap.  6. Wash thoroughly, paying special attention to the area where your surgery will be performed.  7. Thoroughly rinse your body with warm water.  8. Do not shower/wash with your normal soap after using and rinsing off the CHG soap.  9. Pat yourself dry with a clean towel.  10. Wear clean pajamas to bed the night before surgery.  12. Place clean sheets on your bed the night of your first shower and do not sleep with pets.  13. Shower again with the CHG soap on the day of surgery prior to arriving at the hospital.  14. Do not apply any deodorants/lotions/powders.  15. Please wear clean clothes to the hospital.How to Use an Incentive Spirometer  An incentive spirometer is a tool that measures how well you are filling your lungs with each breath. Learning to take  long, deep breaths using this tool can help you keep your lungs clear and active. This may help to reverse or lessen your chance of developing breathing (pulmonary) problems, especially infection. You may be asked to use a spirometer: After a surgery. If you have a lung problem or a history of smoking. After a long period of time when you have been unable to move or be active. If the spirometer includes an indicator to show the highest number that you have reached, your health care provider or respiratory therapist will help you set a goal. Keep a log of your progress as told by your health care provider. What are the risks? Breathing too quickly may cause dizziness or cause you to pass out. Take your time so you do not get dizzy or light-headed. If you are in pain, you may need to take pain medicine before doing incentive spirometry. It is harder to take a deep breath if you are having pain. How to use your incentive spirometer  Sit up on the edge of your bed or on a chair. Hold the incentive spirometer so that it is in an upright position. Before you use the spirometer, breathe out normally. Place the mouthpiece in your mouth. Make sure your lips are closed tightly around it. Breathe in slowly and as deeply as you can through your mouth, causing the piston or the ball to rise toward the top of the chamber. Hold your breath for 3-5 seconds, or for as long as possible. If the spirometer includes a coach indicator, use this to guide you in breathing. Slow down your breathing if the indicator goes above the marked areas. Remove the mouthpiece from your mouth and breathe out normally. The piston or ball will return to the bottom of the chamber. Rest for a few seconds, then repeat the steps 10 or more times. Take your time and take a few normal breaths between deep breaths so that you do not get dizzy or light-headed. Do this every 1-2 hours when you are awake. If the spirometer includes a goal  marker to show the highest number you have reached (best effort), use this as a goal to work toward during each repetition. After each set of 10 deep breaths, cough a few times. This will help to make sure that your lungs are clear. If you have an incision on your chest or abdomen from surgery, place a pillow or a rolled-up towel firmly against the incision when you cough. This can help to reduce pain while taking deep breaths and coughing. General tips When  you are able to get out of bed: Walk around often. Continue to take deep breaths and cough in order to clear your lungs. Keep using the incentive spirometer until your health care provider says it is okay to stop using it. If you have been in the hospital, you may be told to keep using the spirometer at home. Contact a health care provider if: You are having difficulty using the spirometer. You have trouble using the spirometer as often as instructed. Your pain medicine is not giving enough relief for you to use the spirometer as told. You have a fever. Get help right away if: You develop shortness of breath. You develop a cough with bloody mucus from the lungs. You have fluid or blood coming from an incision site after you cough. Summary An incentive spirometer is a tool that can help you learn to take long, deep breaths to keep your lungs clear and active. You may be asked to use a spirometer after a surgery, if you have a lung problem or a history of smoking, or if you have been inactive for a long period of time. Use your incentive spirometer as instructed every 1-2 hours while you are awake. If you have an incision on your chest or abdomen, place a pillow or a rolled-up towel firmly against your incision when you cough. This will help to reduce pain. Get help right away if you have shortness of breath, you cough up bloody mucus, or blood comes from your incision when you cough. This information is not intended to replace advice given  to you by your health care provider. Make sure you discuss any questions you have with your health care provider. Document Revised: 05/04/2019 Document Reviewed: 05/04/2019 Elsevier Patient Education  [place].   Please go to the following website to access important education materials concerning your upcoming joint replacement.                                   [email or web address]

## 2022-05-07 NOTE — Progress Notes (Signed)
  Perioperative Services  Abnormal Lab Notification and Treatment Plan of Care  Date: 05/07/22  Name: Nicole Austin MRN:   [id number]  Re: Abnormal labs noted during PAT appointment  Provider Notified: Steffanie Rainwater, MD Notification mode: Routed and/or faxed via [place] of concern: Lab Results  Component Value Date   STAPHAUREUS POSITIVE (A) 03/21/2022   MRSAPCR POSITIVE (A) 03/21/2022    Notes: Patient is scheduled for a TOTAL HIP ARTHROPLASTY ANTERIOR APPROACH (Left: Hip) on 05/17/2022. She is scheduled to receive CEFAZOLIN pre-operatively. Surgical PCR (+) for MRSA; see above. Of note, patient previously scheduled for surgery on 03/28/2022. She was subsequenly cancelled because she failed to hold her anticoagultion therapy. MRSA (+) result for 03/21/2022 being used for this case.   PLANS:  Review renal function. Estimated Creatinine Clearance: 65.8 mL/min (by C-G formula based on SCr of 0.97 mg/dL).  Review allergies. No documented allergy to vancomycin.  Order added for VANCOMYCIN 1 GRAM IV to current preoperative prophylactic regimen.   Patient with orders for both CEFAZOLIN + VANCOMYCIN to be given in the setting of documented MRSA (+) surgical PCR.   Guidelines suggest that a beta-lactam antibiotic (first or second generation cephalosporin) should be added for activity against gram-negative organisms.  Vancomycin appears to be less effective than cefazolin for preventing SSIs caused by MSSA. For this reason, the use of vancomycin in combination with cefazolin is favored for prevention of SSI due to MRSA and coagulase-negative staphylococci.  Medical history in [facility] updated to reflect (+) PCR result indicating nasal MRSA colonization   Rx for additional preoperative nasal decolonization sent to patient's pharmacy of record per MD request.   Meds ordered this encounter  Medications   mupirocin ointment (BACTROBAN) 2 %    Sig: Apply small about inside of both  nostrils TWICE a day for the next 5 days.    Dispense:  15 g    Refill:  0    Honor Loh, MSN, APRN, FNP-C, CEN [facility]  Peri-operative Services Nurse Practitioner Phone: [phone] 05/07/22 1:15 PM

## 2022-05-07 NOTE — Progress Notes (Signed)
  [facility] Perioperative Services: Pre-Admission/Anesthesia Testing  Abnormal Lab Notification   Date: 05/07/22  Name: Nicole Austin MRN:   [id number]  Re: Abnormal labs noted during PAT appointment   Notified:  Provider Name Provider Role Notification Mode  Steffanie Rainwater, MD Orthopedics (Surgeon) Routed and/or faxed via Derenda Fennel [facility] Primary Care Provider Routed and/or faxed via [place] and Notes:  ABNORMAL LAB VALUE(S): Lab Results  Component Value Date   NA 125 (L) 05/07/2022   CL 90 (L) 05/07/2022    Nicole Austin is scheduled for a TOTAL HIP ARTHROPLASTY ANTERIOR APPROACH (Left: Hip) on 05/17/2022.  In review of her medication reconciliation, patient is not noted to be taking any type of diuretic therapies. Renal function normal with a BUN of 6 mg/dL and a creatinine of 0.81 mg/dL. Chloride also low at 90 mmol/L. Remaining electrolytes WNL/acceptable.   In review of her medication list, patient with several interventions in place for her psychiatric conditions. Divalproex, lurasidone, and escitalopram can all lead to SIADH-like syndromes resulting in HYPOnatremia. Unsure how long patient has been on these medications. It appears as if patient has been persistently hyponatremic since 12/2020 (range 128-134 mmol/L). Sodium at lowest point today on preoperative labs at 125 mmol/L.  With that being said, given her upcoming elective orthopedic procedure, electrolyte derangement ideally needs to be better optimized. In efforts to promote a safe and effective surgical/anesthetic course, the threshold for sodium levels, as decided by our [facility] anesthesia group, is 125 mmol/L. Levels at or below this level stand to result in case being postponed, unless deemed urgent/emergent, pending adequate optimization. Sending note to primary attending surgeon and patient's PCP for review.    Honor Loh, MSN,  APRN, FNP-C, CEN [facility]  Peri-operative Services Nurse Practitioner Phone: [phone] Fax: [phone] 05/07/22 4:55 PM

## 2022-05-09 ENCOUNTER — Telehealth: Payer: Self-pay | Admitting: Family Medicine

## 2022-05-09 ENCOUNTER — Encounter: Payer: Self-pay | Admitting: Family Medicine

## 2022-05-09 ENCOUNTER — Ambulatory Visit (INDEPENDENT_AMBULATORY_CARE_PROVIDER_SITE_OTHER): Payer: 59 | Admitting: Family Medicine

## 2022-05-09 VITALS — BP 136/78 | HR 90 | Temp 97.6°F | Resp 16 | Ht 61.0 in | Wt 224.3 lb

## 2022-05-09 DIAGNOSIS — Z01818 Encounter for other preprocedural examination: Secondary | ICD-10-CM

## 2022-05-09 DIAGNOSIS — M87052 Idiopathic aseptic necrosis of left femur: Secondary | ICD-10-CM | POA: Diagnosis not present

## 2022-05-09 DIAGNOSIS — M87051 Idiopathic aseptic necrosis of right femur: Secondary | ICD-10-CM | POA: Diagnosis not present

## 2022-05-09 DIAGNOSIS — G4730 Sleep apnea, unspecified: Secondary | ICD-10-CM

## 2022-05-09 DIAGNOSIS — Z79899 Other long term (current) drug therapy: Secondary | ICD-10-CM

## 2022-05-09 DIAGNOSIS — J449 Chronic obstructive pulmonary disease, unspecified: Secondary | ICD-10-CM

## 2022-05-09 DIAGNOSIS — E871 Hypo-osmolality and hyponatremia: Secondary | ICD-10-CM | POA: Diagnosis not present

## 2022-05-09 NOTE — Telephone Encounter (Signed)
Called back, disconnected on there end waited for minutes. I wanted to let Prentiss Bells know we have the forms but patient has not been seen yet. She has an appointment today for clearance.

## 2022-05-09 NOTE — Telephone Encounter (Signed)
Copied from [place] [phone]. Topic: General - Other >> May 09, 2022 12:45 PM [place] B wrote: Reason for CRM: Prentiss Bells from [place] called in again about clearance for pt, hasn't heard anything since 02/05 with form they faxed over, also on March 11. Please cal back

## 2022-05-09 NOTE — Progress Notes (Signed)
Patient ID: Nicole Austin, female    DOB: 09-Oct-1960, 62 y.o.   MRN: [id number]  PCP: Delsa Grana, PA-C  Chief Complaint  Patient presents with   Pre-op Exam     Subjective:   Nicole Austin is a 62 y.o. female, presents to clinic with CC of the following:  HPI  Pt presents for preop clearance eval 05/17/2022 surgery specialist at [facility]- hip replacement - forms received   Pt reports no new issues or health concerns she is just in a lot of pain- she has gotten hundreds of pain meds in the past ~2 months, recent ED visit for more last week  Hyponatremia on recent labs She reports drinking a lot of water daily  Drinks about 3000 mL water per day (6 water bottles - 500 mL)  No ETOH, meds from psychiatry can cause SIADH    Patient Active Problem List   Diagnosis Date Noted   History of methicillin resistant staphylococcus aureus (MRSA) 05/03/2022   Avascular necrosis of bones of both hips ([place]) 05/03/2022   Chronic prescription benzodiazepine use 05/03/2022   Polypharmacy 04/09/2022   Sinus bradycardia 04/09/2022   Depression with anxiety 04/09/2022   AVN of femur ([place]) 03/21/2022   MRSA (methicillin resistant staph aureus) culture positive 05/04/2021   Prolonged QT interval 01/01/2021   Benign neoplasm of ascending colon    Polyp of sigmoid colon    Bipolar disorder with depression ([place]) 02/25/2017   ADD (attention deficit disorder) 02/25/2017   Marijuana use 02/13/2017   Neuropathy, peripheral 05/07/2016   Vitamin B12 deficiency 10/24/2015   Fibromyalgia 08/30/2015   GERD (gastroesophageal reflux disease) 08/30/2015   OP (osteoporosis) 06/30/2015   Sleep apnea 06/30/2015   HLD (hyperlipidemia) 05/13/2015   Morbid (severe) obesity due to excess calories ([place]) 08/16/2014   Low back pain with sciatica 08/04/2014   Polysubstance abuse ([place]) 07/04/2014   Anxiety, generalized 11/24/2013   COPD (chronic obstructive pulmonary disease) ([place]) 11/18/2013    Arthritis of knee, degenerative 07/15/2013   Osteoarthritis of knee, unspecified 07/15/2013      Current Outpatient Medications:    acetaminophen (TYLENOL) 500 MG tablet, Take 2 tablets (1,000 mg total) by mouth every 8 (eight) hours as needed., Disp: 90 tablet, Rfl: 0   albuterol (VENTOLIN HFA) 108 (90 Base) MCG/ACT inhaler, Inhale 2 puffs into the lungs every 4 (four) hours as needed for wheezing or shortness of breath., Disp: 18 g, Rfl: 2   ALPRAZolam (XANAX) 0.5 MG tablet, Take 1 tablet (0.5 mg total) by mouth 2 (two) times daily., Disp: , Rfl: 0   atorvastatin (LIPITOR) 40 MG tablet, TAKE 1 TABLET BY MOUTH EVERY NIGHT AT BEDTIME, Disp: 90 tablet, Rfl: 3   divalproex (DEPAKOTE) 250 MG DR tablet, Take 1 tablet (250 mg total) by mouth 2 (two) times daily., Disp: 60 tablet, Rfl: 2   ibuprofen (ADVIL) 200 MG tablet, Take 200 mg by mouth every 6 (six) hours as needed., Disp: , Rfl:    lurasidone (LATUDA) 80 MG TABS tablet, Take 1 tablet (80 mg total) by mouth daily with breakfast., Disp: 90 tablet, Rfl: 0   meloxicam (MOBIC) 15 MG tablet, Take 15 mg by mouth daily., Disp: , Rfl:    mupirocin ointment (BACTROBAN) 2 %, Apply small about inside of both nostrils TWICE a day for the next 5 days., Disp: 15 g, Rfl: 0   nystatin (MYCOSTATIN/NYSTOP) powder, Apply 1 application. topically 3 (three) times daily as needed (rash in skin folds).,  Disp: 60 g, Rfl: 1   nystatin-triamcinolone ointment (MYCOLOG), Apply 1 Application topically 2 (two) times daily., Disp: 30 g, Rfl: 0   OXYGEN, Inhale 2 L into the lungs as needed., Disp: , Rfl:    rivaroxaban (XARELTO) 10 MG TABS tablet, Take 10 mg by mouth daily., Disp: , Rfl:    rOPINIRole (REQUIP XL) 4 MG 24 hr tablet, Take 4 mg by mouth at bedtime., Disp: , Rfl:    senna-docusate (SENOKOT-S) 8.6-50 MG tablet, Take 1 tablet by mouth 2 (two) times daily., Disp: 100 tablet, Rfl: 0   Tiotropium Bromide Monohydrate (SPIRIVA RESPIMAT) 2.5 MCG/ACT AERS, Inhale 2 puffs  into the lungs daily., Disp: 1 each, Rfl: 0   escitalopram (LEXAPRO) 20 MG tablet, Take 1 tablet (20 mg total) by mouth daily., Disp: 30 tablet, Rfl: 2   Allergies  Allergen Reactions   Chantix [Varenicline] Nausea Only   Glycopyrrolate Rash    Oral irritation     Social History   Tobacco Use   Smoking status: Former    Packs/day: 0.50    Years: 36.00    Total pack years: 18.00    Types: Cigarettes    Quit date: 03/13/2022    Years since quitting: 0.1    Passive exposure: Past   Smokeless tobacco: Never   Tobacco comments:    Pt using nicotine patches; 1/2 PPD as of 03/21/21  Vaping Use   Vaping Use: Some days   Substances: Nicotine, Flavoring  Substance Use Topics   Alcohol use: No    Alcohol/week: 0.0 standard drinks of alcohol   Drug use: Yes    Types: Marijuana    Comment: occasionally      Chart Review Today:   Review of Systems  Constitutional: Negative.   HENT: Negative.    Eyes: Negative.   Respiratory: Negative.    Cardiovascular: Negative.   Gastrointestinal: Negative.   Endocrine: Negative.   Genitourinary: Negative.   Musculoskeletal: Negative.   Skin: Negative.   Allergic/Immunologic: Negative.   Neurological: Negative.   Hematological: Negative.   Psychiatric/Behavioral: Negative.    All other systems reviewed and are negative.      Objective:   Vitals:   05/09/22 1451  BP: 136/78  Pulse: 90  Resp: 16  Temp: 97.6 F (36.4 C)  TempSrc: Oral  SpO2: 97%  Weight: 224 lb 4.8 oz (101.7 kg)  Height: '5\' 1"'$  (1.549 m)    Body mass index is 42.38 kg/m.  Physical Exam Vitals and nursing note reviewed.  Constitutional:      General: She is not in acute distress.    Appearance: Normal appearance. She is well-developed. She is obese. She is not ill-appearing, toxic-appearing or diaphoretic.  HENT:     Head: Normocephalic and atraumatic.     Nose: Nose normal.  Eyes:     General:        Right eye: No discharge.        Left eye: No  discharge.     Conjunctiva/sclera: Conjunctivae normal.  Neck:     Trachea: No tracheal deviation.  Cardiovascular:     Rate and Rhythm: Normal rate and regular rhythm.     Pulses: Normal pulses.     Heart sounds: Normal heart sounds.  Pulmonary:     Effort: Pulmonary effort is normal. No respiratory distress.     Breath sounds: Normal breath sounds. No stridor.  Musculoskeletal:     Comments: In WC throughout OV/exam  Skin:  General: Skin is warm and dry.     Findings: No rash.  Neurological:     Mental Status: She is alert. Mental status is at baseline.     Motor: No abnormal muscle tone.     Coordination: Coordination normal.  Psychiatric:        Behavior: Behavior normal.      Results for orders placed or performed during the hospital encounter of 05/07/22  CBC WITH DIFFERENTIAL  Result Value Ref Range   WBC 7.4 4.0 - 10.5 K/uL   RBC 4.24 3.87 - 5.11 MIL/uL   Hemoglobin 12.3 12.0 - 15.0 g/dL   HCT 36.7 36.0 - 46.0 %   MCV 86.6 80.0 - 100.0 fL   MCH 29.0 26.0 - 34.0 pg   MCHC 33.5 30.0 - 36.0 g/dL   RDW 14.0 11.5 - 15.5 %   Platelets 250 150 - 400 K/uL   nRBC 0.0 0.0 - 0.2 %   Neutrophils Relative % 61 %   Neutro Abs 4.5 1.7 - 7.7 K/uL   Lymphocytes Relative 25 %   Lymphs Abs 1.8 0.7 - 4.0 K/uL   Monocytes Relative 10 %   Monocytes Absolute 0.7 0.1 - 1.0 K/uL   Eosinophils Relative 3 %   Eosinophils Absolute 0.3 0.0 - 0.5 K/uL   Basophils Relative 1 %   Basophils Absolute 0.1 0.0 - 0.1 K/uL   Immature Granulocytes 0 %   Abs Immature Granulocytes 0.01 0.00 - 0.07 K/uL  Comprehensive metabolic panel  Result Value Ref Range   Sodium 125 (L) 135 - 145 mmol/L   Potassium 4.0 3.5 - 5.1 mmol/L   Chloride 90 (L) 98 - 111 mmol/L   CO2 27 22 - 32 mmol/L   Glucose, Bld 78 70 - 99 mg/dL   BUN 6 (L) 8 - 23 mg/dL   Creatinine, Ser 0.81 0.44 - 1.00 mg/dL   Calcium 8.8 (L) 8.9 - 10.3 mg/dL   Total Protein 6.6 6.5 - 8.1 g/dL   Albumin 3.4 (L) 3.5 - 5.0 g/dL   AST 15  15 - 41 U/L   ALT 10 0 - 44 U/L   Alkaline Phosphatase 66 38 - 126 U/L   Total Bilirubin 0.5 0.3 - 1.2 mg/dL   GFR, Estimated >60 >60 mL/min   Anion gap 8 5 - 15  Urinalysis, Routine w reflex microscopic -Urine, Clean Catch  Result Value Ref Range   Color, Urine YELLOW (A) YELLOW   APPearance CLEAR (A) CLEAR   Specific Gravity, Urine 1.011 1.005 - 1.030   pH 7.0 5.0 - 8.0   Glucose, UA NEGATIVE NEGATIVE mg/dL   Hgb urine dipstick NEGATIVE NEGATIVE   Bilirubin Urine NEGATIVE NEGATIVE   Ketones, ur NEGATIVE NEGATIVE mg/dL   Protein, ur NEGATIVE NEGATIVE mg/dL   Nitrite NEGATIVE NEGATIVE   Leukocytes,Ua NEGATIVE NEGATIVE  Type and screen Order type and screen if day of surgery is less than 15 days from draw of preadmission visit or order morning of surgery if day of surgery is greater than 6 days from preadmission visit.  Result Value Ref Range   ABO/RH(D) O POS    Antibody Screen NEG    Sample Expiration 05/21/2022,2359    Extend sample reason      NO TRANSFUSIONS OR PREGNANCY IN THE PAST 3 MONTHS Performed at [facility], [address], [place], [place] [address]        Assessment & Plan:   1. Encounter for preoperative assessment Forms  to be completed once labs resulted - she's likely moderate risk - will need to optimize electrolytes but everything else stable - Urinalysis, Routine w reflex microscopic - Drugs of abuse screen w/o alc, rtn urine-sln - DG Chest 2 View; Future  2. Chronic obstructive pulmonary disease, unspecified COPD type ([place]) She may not be able to get transportation to get this completed today, respiratory status has been stable, no recent infection/exacerbations or worsening sx - DG Chest 2 View; Future  3. Sleep apnea, unspecified type  - DG Chest 2 View; Future  4. Prolonged QT interval Reviewed preop ECG  5. Polypharmacy  - Urinalysis, Routine w reflex microscopic - Drugs of abuse screen w/o alc, rtn urine-sln  6. Depression  with anxiety Managed by psychiatry  7. Chronic prescription benzodiazepine use A ongoing concern - Urinalysis, Routine w reflex microscopic - Drugs of abuse screen w/o alc, rtn urine-sln  8. Long-term use of high-risk medication  - Urinalysis, Routine w reflex microscopic - Drugs of abuse screen w/o alc, rtn urine-sln  9. Hyponatremia Recent preop labs showed worsening electrolyte abnormality  - COMPLETE METABOLIC PANEL WITH GFR   preop assessment appt done on 3/11 at hospital, ECG and labs done Pt is not on blood thinners Rechecking labs here in office and will complete forms for surgery  Biggest concerns is polypharmacy, hx of substance abuse/ concern for diversion of pain meds, COPD, electrolyte abnormality    Delsa Grana, PA-C 05/09/22 3:14 PM

## 2022-05-10 NOTE — Telephone Encounter (Unsigned)
Copied from [place] [id number]. Topic: General - Other >> May 10, 2022  9:03 AM [name] wrote: Reason for CRM: Olivia Mackie with [facility] has called to discuss medication cessation with a member of clinical staff when possible  Please contact further when possible

## 2022-05-10 NOTE — Telephone Encounter (Signed)
I also mentioned PCP still working on forms and once completed will fax them over

## 2022-05-10 NOTE — Telephone Encounter (Signed)
Spoke to [place], she was wanting to know if PCP was giving patient pain meds. I told her PCP has not given her any pain meds but patient seems to be going frequently to ER.

## 2022-05-11 ENCOUNTER — Encounter (HOSPITAL_COMMUNITY): Payer: Self-pay

## 2022-05-11 ENCOUNTER — Other Ambulatory Visit: Payer: Self-pay

## 2022-05-11 ENCOUNTER — Encounter: Payer: Self-pay | Admitting: Pulmonary Disease

## 2022-05-11 ENCOUNTER — Emergency Department (HOSPITAL_COMMUNITY)
Admission: EM | Admit: 2022-05-11 | Discharge: 2022-05-11 | Disposition: A | Discharge status: Home / Self Care | Payer: 59 | Attending: Emergency Medicine | Admitting: Emergency Medicine

## 2022-05-11 DIAGNOSIS — G8929 Other chronic pain: Secondary | ICD-10-CM | POA: Insufficient documentation

## 2022-05-11 DIAGNOSIS — M87051 Idiopathic aseptic necrosis of right femur: Secondary | ICD-10-CM | POA: Diagnosis not present

## 2022-05-11 DIAGNOSIS — M25552 Pain in left hip: Secondary | ICD-10-CM | POA: Diagnosis not present

## 2022-05-11 DIAGNOSIS — M87052 Idiopathic aseptic necrosis of left femur: Secondary | ICD-10-CM | POA: Diagnosis not present

## 2022-05-11 DIAGNOSIS — M87059 Idiopathic aseptic necrosis of unspecified femur: Secondary | ICD-10-CM | POA: Insufficient documentation

## 2022-05-11 DIAGNOSIS — Z87891 Personal history of nicotine dependence: Secondary | ICD-10-CM | POA: Insufficient documentation

## 2022-05-11 DIAGNOSIS — M25551 Pain in right hip: Secondary | ICD-10-CM | POA: Diagnosis not present

## 2022-05-11 DIAGNOSIS — I10 Essential (primary) hypertension: Secondary | ICD-10-CM | POA: Diagnosis not present

## 2022-05-11 LAB — BASIC METABOLIC PANEL
Anion gap: 11 (ref 5–15)
BUN: 8 mg/dL (ref 8–23)
CO2: 23 mmol/L (ref 22–32)
Calcium: 9.4 mg/dL (ref 8.9–10.3)
Chloride: 93 mmol/L — ABNORMAL LOW (ref 98–111)
Creatinine, Ser: 0.75 mg/dL (ref 0.44–1.00)
GFR, Estimated: >60 mL/min (ref >60)
Glucose, Bld: 96 mg/dL (ref 70–99)
Potassium: 4 mmol/L (ref 3.5–5.1)
Sodium: 127 mmol/L — ABNORMAL LOW (ref 135–145)

## 2022-05-11 LAB — COMPLETE METABOLIC PANEL WITH GFR
AG Ratio: 1.5 (calc) (ref 1.0–2.5)
ALT: 9 U/L (ref 6–29)
AST: 12 U/L (ref 10–35)
Albumin: 3.6 g/dL (ref 3.6–5.1)
Alkaline phosphatase (APISO): 70 U/L (ref 37–153)
BUN: 7 mg/dL (ref 7–25)
CO2: 25 mmol/L (ref 20–32)
Calcium: 9.2 mg/dL (ref 8.6–10.4)
Chloride: 94 mmol/L — ABNORMAL LOW (ref 98–110)
Creat: 0.81 mg/dL (ref 0.50–1.05)
Globulin: 2.4 g/dL (calc) (ref 1.9–3.7)
Glucose, Bld: 82 mg/dL (ref 65–99)
Potassium: 4.5 mmol/L (ref 3.5–5.3)
Sodium: 129 mmol/L — ABNORMAL LOW (ref 135–146)
Total Bilirubin: 0.3 mg/dL (ref 0.2–1.2)
Total Protein: 6 g/dL — ABNORMAL LOW (ref 6.1–8.1)
eGFR: 82 mL/min/{1.73_m2} (ref >=60)

## 2022-05-11 LAB — CBC WITH DIFFERENTIAL/PLATELET
Abs Immature Granulocytes: 0 10*3/uL (ref 0.00–0.07)
Basophils Absolute: 0.1 10*3/uL (ref 0.0–0.1)
Basophils Relative: 1 %
Eosinophils Absolute: 0.2 10*3/uL (ref 0.0–0.5)
Eosinophils Relative: 3 %
HCT: 36.8 % (ref 36.0–46.0)
Hemoglobin: 12.4 g/dL (ref 12.0–15.0)
Immature Granulocytes: 0 %
Lymphocytes Relative: 33 %
Lymphs Abs: 2.4 10*3/uL (ref 0.7–4.0)
MCH: 29.7 pg (ref 26.0–34.0)
MCHC: 33.7 g/dL (ref 30.0–36.0)
MCV: 88 fL (ref 80.0–100.0)
Monocytes Absolute: 0.7 10*3/uL (ref 0.1–1.0)
Monocytes Relative: 10 %
Neutro Abs: 4.1 10*3/uL (ref 1.7–7.7)
Neutrophils Relative %: 53 %
Platelets: 249 10*3/uL (ref 150–400)
RBC: 4.18 MIL/uL (ref 3.87–5.11)
RDW: 14 % (ref 11.5–15.5)
WBC: 7.5 10*3/uL (ref 4.0–10.5)
nRBC: 0 % (ref 0.0–0.2)

## 2022-05-11 LAB — DRUG MONITOR, PANEL 1, W/CONF, URINE
Alphahydroxyalprazolam: 1130 ng/mL — ABNORMAL HIGH (ref <25)
Alphahydroxymidazolam: NEGATIVE ng/mL (ref <50)
Alphahydroxytriazolam: NEGATIVE ng/mL (ref <50)
Aminoclonazepam: NEGATIVE ng/mL (ref <25)
Amphetamines: NEGATIVE ng/mL (ref <500)
Barbiturates: NEGATIVE ng/mL (ref <300)
Benzodiazepines: POSITIVE ng/mL — AB (ref <100)
Cocaine Metabolite: NEGATIVE ng/mL (ref <150)
Codeine: NEGATIVE ng/mL (ref <50)
Creatinine: 155.5 mg/dL (ref >=20.0)
Hydrocodone: NEGATIVE ng/mL (ref <50)
Hydromorphone: NEGATIVE ng/mL (ref <50)
Hydroxyethylflurazepam: NEGATIVE ng/mL (ref <50)
Lorazepam: NEGATIVE ng/mL (ref <50)
Marijuana Metabolite: 751 ng/mL — ABNORMAL HIGH (ref <5)
Marijuana Metabolite: POSITIVE ng/mL — AB (ref <20)
Methadone Metabolite: NEGATIVE ng/mL (ref <100)
Morphine: NEGATIVE ng/mL (ref <50)
Nordiazepam: NEGATIVE ng/mL (ref <50)
Norhydrocodone: NEGATIVE ng/mL (ref <50)
Noroxycodone: 1838 ng/mL — ABNORMAL HIGH (ref <50)
Opiates: NEGATIVE ng/mL (ref <100)
Oxazepam: NEGATIVE ng/mL (ref <50)
Oxidant: NEGATIVE ug/mL (ref <200)
Oxycodone: 155 ng/mL — ABNORMAL HIGH (ref <50)
Oxycodone: POSITIVE ng/mL — AB (ref <100)
Oxymorphone: 1855 ng/mL — ABNORMAL HIGH (ref <50)
Phencyclidine: NEGATIVE ng/mL (ref <25)
Temazepam: NEGATIVE ng/mL (ref <50)
pH: 8.2 (ref 4.5–9.0)

## 2022-05-11 LAB — URINALYSIS, ROUTINE W REFLEX MICROSCOPIC
Bilirubin Urine: NEGATIVE
Glucose, UA: NEGATIVE
Hgb urine dipstick: NEGATIVE
Hyaline Cast: NONE SEEN /LPF
Ketones, ur: NEGATIVE
Nitrite: NEGATIVE
Specific Gravity, Urine: 1.024 (ref 1.001–1.035)
pH: 8 (ref 5.0–8.0)

## 2022-05-11 LAB — DM TEMPLATE

## 2022-05-11 LAB — MICROSCOPIC MESSAGE

## 2022-05-11 MED ORDER — HYDROMORPHONE HCL 1 MG/ML IJ SOLN
1.0000 mg | Freq: Once | INTRAMUSCULAR | Status: AC
  Administered 2022-05-11: 1 mg via INTRAVENOUS
  Filled 2022-05-11: qty 1

## 2022-05-11 MED ORDER — OXYCODONE HCL 10 MG PO TABS: 10.0000 mg | ORAL_TABLET | Freq: Four times a day (QID) | ORAL | 0 refills | Status: DC | PRN

## 2022-05-11 NOTE — ED Triage Notes (Signed)
Chronic left hip pain, scheduled for surgery in may. 2 days ago felt a pop in her hip, having worsening pain since that radiates down to her knees. Able to ambulate with walker briefly due to pain. No trauma, falls or obvious deformity. Tender to touch.

## 2022-05-11 NOTE — Discharge Instructions (Signed)
I have prescribed you several doses of oxycodone for pain.  As we discussed, you can follow-up with your orthopedic doctor or I have referred you to Dr. Erlinda Hong and you can call the office on Monday for appointment  Return to ER if you have uncontrolled pain, fever

## 2022-05-11 NOTE — ED Provider Notes (Signed)
[facility] Provider Note   CSN: [id number] Arrival date & time: 05/11/22  1650     History  Chief Complaint  Patient presents with   Hip Pain    Nicole Austin is a 62 y.o. female history of avascular necrosis of the femoral neck requiring hip replacement here presenting bilateral hip pain.  Patient states that her hip surgery got canceled twice.  She was supposed to happen next week and for some reason it got canceled.  She states that she was told that she needs to stop smoking before she gets the hip surgery.  She was trying to vape instead.  She states that she ran out of her pain medicine several days ago.  Patient states that she wants another opinion from another orthopedic doctor.  She is frustrated about her pain situation.  Of note patient has several ED visits for pain related issues about her hip.  Patient uses a walker and a wheelchair at home.   The history is provided by the patient.       Home Medications Prior to Admission medications   Medication Sig Start Date End Date Taking? Authorizing Provider  acetaminophen (TYLENOL) 500 MG tablet Take 2 tablets (1,000 mg total) by mouth every 8 (eight) hours as needed. 02/08/22   Delsa Grana, PA-C  albuterol (VENTOLIN HFA) 108 (90 Base) MCG/ACT inhaler Inhale 2 puffs into the lungs every 4 (four) hours as needed for wheezing or shortness of breath. 11/08/21   Delsa Grana, PA-C  ALPRAZolam Duanne Moron) 0.5 MG tablet Take 1 tablet (0.5 mg total) by mouth 2 (two) times daily. 04/10/22   Sharen Hones, MD  atorvastatin (LIPITOR) 40 MG tablet TAKE 1 TABLET BY MOUTH EVERY NIGHT AT BEDTIME 04/25/21   [name], DO  divalproex (DEPAKOTE) 250 MG DR tablet Take 1 tablet (250 mg total) by mouth 2 (two) times daily. 11/19/16   Rainey Pines, MD  escitalopram (LEXAPRO) 20 MG tablet Take 1 tablet (20 mg total) by mouth daily. 01/05/21 05/07/22  [place] Donnamarie Poag, DO  ibuprofen (ADVIL) 200 MG  tablet Take 200 mg by mouth every 6 (six) hours as needed.    [provider]  lurasidone (LATUDA) 80 MG TABS tablet Take 1 tablet (80 mg total) by mouth daily with breakfast. 06/06/21   Myles Gip, DO  meloxicam (MOBIC) 15 MG tablet Take 15 mg by mouth daily.    [provider]  mupirocin ointment (BACTROBAN) 2 % Apply small about inside of both nostrils TWICE a day for the next 5 days. 05/07/22   Karen Kitchens, NP  nystatin (MYCOSTATIN/NYSTOP) powder Apply 1 application. topically 3 (three) times daily as needed (rash in skin folds). 06/27/21   Delsa Grana, PA-C  nystatin-triamcinolone ointment (MYCOLOG) Apply 1 Application topically 2 (two) times daily. 11/13/21   Bo Merino, FNP  OXYGEN Inhale 2 L into the lungs as needed.    [provider]  rOPINIRole (REQUIP XL) 4 MG 24 hr tablet Take 4 mg by mouth at bedtime.    [provider]  senna-docusate (SENOKOT-S) 8.6-50 MG tablet Take 1 tablet by mouth 2 (two) times daily. 04/10/22   Sharen Hones, MD  Tiotropium Bromide Monohydrate (SPIRIVA RESPIMAT) 2.5 MCG/ACT AERS Inhale 2 puffs into the lungs daily. 11/21/21   Delman Kitten, MD      Allergies    Chantix [varenicline] and Glycopyrrolate    Review of Systems   Review of Systems  Musculoskeletal:        Hip pain   All other systems reviewed and are negative.   Physical Exam Updated Vital Signs BP (!) 149/75 (BP Location: Left Arm)   Pulse 64   Temp 98.5 F (36.9 C) (Oral)   Resp 18   Ht 5\' 1"  (1.549 m)   Wt 101.7 kg   LMP  (LMP Unknown)   SpO2 97%   BMI 42.36 kg/m  Physical Exam Vitals and nursing note reviewed.  Constitutional:      Comments: Uncomfortable and crying in pain  HENT:     Head: Normocephalic.     Nose: Nose normal.     Mouth/Throat:     Mouth: Mucous membranes are moist.  Eyes:     Extraocular Movements: Extraocular movements intact.     Pupils: Pupils are equal, round, and reactive to light.  Cardiovascular:      Rate and Rhythm: Normal rate and regular rhythm.     Pulses: Normal pulses.     Heart sounds: Normal heart sounds.  Pulmonary:     Effort: Pulmonary effort is normal.     Breath sounds: Normal breath sounds.  Abdominal:     General: Abdomen is flat.     Palpations: Abdomen is soft.  Musculoskeletal:     Cervical back: Normal range of motion and neck supple.     Comments: Difficulty ranging bilateral hips.  Patient has no obvious deformity of the hips or pelvis  Skin:    General: Skin is warm.     Capillary Refill: Capillary refill takes less than 2 seconds.  Neurological:     General: No focal deficit present.     Mental Status: She is oriented to person, place, and time.  Psychiatric:        Mood and Affect: Mood normal.        Behavior: Behavior normal.     ED Results / Procedures / Treatments   Labs (all labs ordered are listed, but only abnormal results are displayed) Labs Reviewed  CBC WITH DIFFERENTIAL/PLATELET  BASIC METABOLIC PANEL    EKG None  Radiology No results found.  Procedures Procedures    Medications Ordered in ED Medications  HYDROmorphone (DILAUDID) injection 1 mg (has no administration in time range)    ED Course/ Medical Decision Making/ A&P                             Medical Decision Making Nicole Austin is a 62 y.o. female here presenting with bilateral hip pain.  She has history of avascular necrosis.  This is a chronic issue.  She ran out of pain meds.  She did not have any fall or trauma.  Patient is scheduled for hip replacement but is canceled due to her smoking history.  Will give pain medicine and patient request referral to another orthopedic doctor.  6:14 PM White blood cell count is normal.  Sodium is 127 which is chronic.  Patient's pain is under control now.  I told her I can give her a short course of pain medicine.  Will refer to oncall ortho.   Problems Addressed: Avascular necrosis of femur, unspecified  laterality [facility]): chronic illness or injury Chronic pain of both hips: chronic illness or injury  Amount and/or Complexity of Data Reviewed Labs: ordered. Decision-making details documented in ED Course.  Risk Prescription drug management.    Final Clinical Impression(s) / ED Diagnoses  Final diagnoses:  None    Rx / DC Orders ED Discharge Orders     None         Drenda Freeze, MD 05/11/22 1815

## 2022-05-14 DIAGNOSIS — M87059 Idiopathic aseptic necrosis of unspecified femur: Secondary | ICD-10-CM | POA: Diagnosis not present

## 2022-05-17 ENCOUNTER — Emergency Department: Payer: 59

## 2022-05-17 ENCOUNTER — Encounter: Admission: RE | Payer: Self-pay | Source: Home / Self Care

## 2022-05-17 ENCOUNTER — Other Ambulatory Visit: Payer: Self-pay

## 2022-05-17 ENCOUNTER — Emergency Department
Admission: EM | Admit: 2022-05-17 | Discharge: 2022-05-17 | Disposition: A | Discharge status: Home / Self Care | Payer: 59 | Attending: Emergency Medicine | Admitting: Emergency Medicine

## 2022-05-17 ENCOUNTER — Ambulatory Visit: Admission: RE | Admit: 2022-05-17 | Payer: 59 | Source: Home / Self Care | Admitting: Orthopedic Surgery

## 2022-05-17 DIAGNOSIS — M87852 Other osteonecrosis, left femur: Secondary | ICD-10-CM | POA: Diagnosis not present

## 2022-05-17 DIAGNOSIS — M87851 Other osteonecrosis, right femur: Secondary | ICD-10-CM | POA: Diagnosis not present

## 2022-05-17 DIAGNOSIS — M87052 Idiopathic aseptic necrosis of left femur: Secondary | ICD-10-CM | POA: Diagnosis not present

## 2022-05-17 DIAGNOSIS — M8700 Idiopathic aseptic necrosis of unspecified bone: Secondary | ICD-10-CM

## 2022-05-17 DIAGNOSIS — Z79899 Other long term (current) drug therapy: Secondary | ICD-10-CM | POA: Diagnosis not present

## 2022-05-17 DIAGNOSIS — G8929 Other chronic pain: Secondary | ICD-10-CM | POA: Diagnosis not present

## 2022-05-17 DIAGNOSIS — Z794 Long term (current) use of insulin: Secondary | ICD-10-CM | POA: Diagnosis not present

## 2022-05-17 DIAGNOSIS — M25552 Pain in left hip: Secondary | ICD-10-CM | POA: Diagnosis not present

## 2022-05-17 DIAGNOSIS — Z7984 Long term (current) use of oral hypoglycemic drugs: Secondary | ICD-10-CM | POA: Diagnosis not present

## 2022-05-17 DIAGNOSIS — Z7982 Long term (current) use of aspirin: Secondary | ICD-10-CM | POA: Diagnosis not present

## 2022-05-17 DIAGNOSIS — E1122 Type 2 diabetes mellitus with diabetic chronic kidney disease: Secondary | ICD-10-CM | POA: Diagnosis not present

## 2022-05-17 SURGERY — ARTHROPLASTY, HIP, TOTAL, ANTERIOR APPROACH
Anesthesia: Choice | Site: Hip | Laterality: Left

## 2022-05-17 MED ORDER — OXYCODONE HCL 10 MG PO TABS: 10.0000 mg | ORAL_TABLET | Freq: Four times a day (QID) | ORAL | 0 refills | Status: DC | PRN

## 2022-05-17 MED ORDER — OXYCODONE-ACETAMINOPHEN 5-325 MG PO TABS
2.0000 | ORAL_TABLET | Freq: Once | ORAL | Status: AC
  Administered 2022-05-17: 2 via ORAL
  Filled 2022-05-17: qty 2

## 2022-05-17 MED ORDER — FENTANYL CITRATE PF 50 MCG/ML IJ SOSY
50.0000 ug | PREFILLED_SYRINGE | Freq: Once | INTRAMUSCULAR | Status: AC
  Administered 2022-05-17: 50 ug via INTRAMUSCULAR
  Filled 2022-05-17: qty 1

## 2022-05-17 NOTE — ED Notes (Signed)
First Nurse: Pt here with left hip pain via [facility]. Pt is not compliant with medications so her hip replacement surgery gets pushed back often. Pt here wanting pain medication. Vitals Stable.  83 90%  86-cbg 179/100 96.1

## 2022-05-17 NOTE — ED Notes (Signed)
Pt in CT.

## 2022-05-17 NOTE — ED Provider Notes (Signed)
[facility] Provider Note    Event Date/Time   First MD Initiated Contact with Patient 05/17/22 1142     (approximate)   History   Hip Pain   HPI  Nicole Austin is a 62 y.o. female with history of avascular necrosis of the left hip among other chronic medical problems see medical history, presents emergency department with complaints of increasing left hip pain.  Patient has been scheduled for surgery 2 times and both times have been canceled.  States she ran out of pain medicine on Tuesday.  Denies fever or chills      Physical Exam   Triage Vital Signs: ED Triage Vitals  Enc Vitals Group     BP 05/17/22 1045 (!) 152/80     Pulse Rate 05/17/22 1045 65     Resp 05/17/22 1045 16     Temp 05/17/22 1045 97.8 F (36.6 C)     Temp src --      SpO2 05/17/22 1045 95 %     Weight 05/17/22 1045 224 lb (101.6 kg)     Height 05/17/22 1045 5\' 1"  (1.549 m)     Head Circumference --      Peak Flow --      Pain Score 05/17/22 1049 10     Pain Loc --      Pain Edu? --      Excl. in [place]? --     Most recent vital signs: Vitals:   05/17/22 1345 05/17/22 1347  BP: (!) 147/80 (!) 147/80  Pulse: 72 72  Resp: 18 18  Temp:    SpO2: 98% 98%     General: Awake, no distress.   CV:  Good peripheral perfusion. regular rate and  rhythm Resp:  Normal effort.  Abd:  No distention.   Other:  Left hip very tender to palpation, decreased range of motion secondary discomfort, neurovascular intact   ED Results / Procedures / Treatments   Labs (all labs ordered are listed, but only abnormal results are displayed) Labs Reviewed - No data to display   EKG     RADIOLOGY CT of the pelvis    PROCEDURES:   Procedures   MEDICATIONS ORDERED IN ED: Medications  fentaNYL (SUBLIMAZE) injection 50 mcg (50 mcg Intramuscular Given 05/17/22 1158)  oxyCODONE-acetaminophen (PERCOCET/ROXICET) 5-325 MG per tablet 2 tablet (2 tablets Oral Given 05/17/22 1342)      IMPRESSION / MDM / [place] / ED COURSE  I reviewed the triage vital signs and the nursing notes.                              Differential diagnosis includes, but is not limited to, avascular necrosis, chronic pain, new fracture of the hip  Patient's presentation is most consistent with acute complicated illness / injury requiring diagnostic workup.   CT of the pelvis due to the patient's history of AVN and now worsening pain.  Fentanyl 50 mcg IM  CT of the pelvis, I did review the radiologist report.  He does comment that the AVN of the left hip has progressed since January.  Consult orthopedics.  Dr. Posey Pronto states for her to follow-up outpatient.  States does not need to be admitted at this time.  Just pain control.  Patient did have some pain control with fentanyl, she was given Percocet p.o. prior to discharge.  Prescription for Percocet.  She does have  an appointment with [facility] tomorrow.  She is to follow-up and see them.  I did instruct her that she does need to have surgery done.  She is in agreement treatment plan.  Discharged stable condition.      FINAL CLINICAL IMPRESSION(S) / ED DIAGNOSES   Final diagnoses:  AVN (avascular necrosis of bone) ([place])  Other chronic pain     Rx / DC Orders   ED Discharge Orders          Ordered    Oxycodone HCl 10 MG TABS  Every 6 hours PRN        05/17/22 1336             Note:  This document was prepared using Dragon voice recognition software and may include unintentional dictation errors.    Versie Starks, PA-C 05/17/22 1740    Carrie Mew, MD 05/17/22 [id number]

## 2022-05-18 DIAGNOSIS — M1612 Unilateral primary osteoarthritis, left hip: Secondary | ICD-10-CM | POA: Diagnosis not present

## 2022-05-22 ENCOUNTER — Emergency Department
Admission: EM | Admit: 2022-05-22 | Discharge: 2022-05-22 | Disposition: A | Discharge status: Home / Self Care | Payer: 59 | Attending: Emergency Medicine | Admitting: Emergency Medicine

## 2022-05-22 ENCOUNTER — Other Ambulatory Visit: Payer: Self-pay

## 2022-05-22 ENCOUNTER — Other Ambulatory Visit: Payer: Self-pay | Admitting: Family Medicine

## 2022-05-22 DIAGNOSIS — M87051 Idiopathic aseptic necrosis of right femur: Secondary | ICD-10-CM | POA: Diagnosis not present

## 2022-05-22 DIAGNOSIS — M25551 Pain in right hip: Secondary | ICD-10-CM | POA: Diagnosis present

## 2022-05-22 DIAGNOSIS — M8700 Idiopathic aseptic necrosis of unspecified bone: Secondary | ICD-10-CM

## 2022-05-22 DIAGNOSIS — L723 Sebaceous cyst: Secondary | ICD-10-CM

## 2022-05-22 DIAGNOSIS — I10 Essential (primary) hypertension: Secondary | ICD-10-CM | POA: Diagnosis not present

## 2022-05-22 DIAGNOSIS — M87052 Idiopathic aseptic necrosis of left femur: Secondary | ICD-10-CM | POA: Insufficient documentation

## 2022-05-22 DIAGNOSIS — M25552 Pain in left hip: Secondary | ICD-10-CM | POA: Diagnosis not present

## 2022-05-22 MED ORDER — OXYCODONE HCL 10 MG PO TABS: 10.0000 mg | ORAL_TABLET | Freq: Four times a day (QID) | ORAL | 0 refills | Status: AC | PRN

## 2022-05-22 MED ORDER — OXYCODONE HCL 5 MG PO TABS
10.0000 mg | ORAL_TABLET | Freq: Once | ORAL | Status: AC
  Administered 2022-05-22: 10 mg via ORAL
  Filled 2022-05-22: qty 2

## 2022-05-22 MED ORDER — OXYCODONE HCL 10 MG PO TABS: 10.0000 mg | ORAL_TABLET | Freq: Four times a day (QID) | ORAL | 0 refills | Status: DC | PRN

## 2022-05-22 NOTE — ED Provider Notes (Signed)
[facility] Provider Note    Event Date/Time   First MD Initiated Contact with Patient 05/22/22 1819     (approximate)   History   Hip Pain   HPI  Nicole Austin is a 62 y.o. female past medical history significant for avascular necrosis of bilateral hips who presents to the emergency department with hip pain.  States that she has had worsening hip pain since running out of her pain medication and since having irritation to it today.  States that she was having to do multiple things around the house including laundry, feeding her cat.  States that she had significant worsening pain to her hips that is worse in the left side.  Is scheduled to have hip replacement and have follow-up with pain clinic in 1 month.  States that she has been seen for this issue in the past.  Denies any extremity numbness or weakness.  Denies any fall.     Physical Exam   Triage Vital Signs: ED Triage Vitals [05/22/22 1824]  Enc Vitals Group     BP (!) 143/74     Pulse Rate 61     Resp 19     Temp 97.9 F (36.6 C)     Temp Source Oral     SpO2 97 %     Weight      Height      Head Circumference      Peak Flow      Pain Score 10     Pain Loc      Pain Edu?      Excl. in [place]?     Most recent vital signs: Vitals:   05/22/22 1824  BP: (!) 143/74  Pulse: 61  Resp: 19  Temp: 97.9 F (36.6 C)  SpO2: 97%    Physical Exam Constitutional:      General: She is in acute distress.     Appearance: She is well-developed.  HENT:     Head: Atraumatic.  Eyes:     Conjunctiva/sclera: Conjunctivae normal.  Cardiovascular:     Rate and Rhythm: Regular rhythm.  Pulmonary:     Effort: No respiratory distress.  Abdominal:     General: There is no distension.  Musculoskeletal:        General: Normal range of motion.     Cervical back: Normal range of motion.     Comments: Tenderness to palpation to bilateral hips.  Sensation intact bilateral lower extremities.  No  saddle anesthesia.  Skin:    General: Skin is warm.  Neurological:     Mental Status: She is alert. Mental status is at baseline.      IMPRESSION / MDM / ASSESSMENT AND PLAN / ED COURSE  I reviewed the triage vital signs and the nursing notes.  On chart review patient had a recent CT scan of the pelvis without contrast that showed avascular necrosis of bilateral hips.  They discussed with Dr. Roland Rack with orthopedics who recommended outpatient follow-up.  Patient has a scheduled surgery in 1 month.  On review of PMP patient has received pain medication through the emergency department but has not been prescribed anything through primary care provider.  10 mg of oxycodone has been prescribed in the past.  Differential diagnosis including musculoskeletal, acute on chronic hip pain, avascular necrosis.  No concern for cauda equina or epidural compression syndrome.  Do not feel that repeat imaging is necessary at this time.  Labs (all labs  ordered are listed, but only abnormal results are displayed) Labs interpreted as -    Labs Reviewed - No data to display    Given p.o. oxycodone  Do a short course of oxycodone to go home with.  Patient has not a scheduled follow-up appointment with pain clinic and orthopedics.  Encouraged to call her primary care physician discussed that we would no longer be giving any narcotic pain medication through the emergency department.  Given return precautions for any worsening symptoms.   PROCEDURES:  Critical Care performed: No  Procedures  Patient's presentation is most consistent with acute, uncomplicated illness.   MEDICATIONS ORDERED IN ED: Medications  oxyCODONE (Oxy IR/ROXICODONE) immediate release tablet 10 mg (10 mg Oral Given 05/22/22 1849)    FINAL CLINICAL IMPRESSION(S) / ED DIAGNOSES   Final diagnoses:  Avascular necrosis ([place])  Left hip pain  Right hip pain     Rx / DC Orders   ED Discharge Orders          Ordered     Oxycodone HCl 10 MG TABS  Every 6 hours PRN        05/22/22 1846             Note:  This document was prepared using Dragon voice recognition software and may include unintentional dictation errors.   Nathaniel Man, MD 05/22/22 Lurline Hare

## 2022-05-22 NOTE — Discharge Instructions (Addendum)
Call your primary care physician fist thing in the morning and ask if they would be able to help you with pain control until you are able to make it to your surgery and to see the pain clinic.  Call your surgeon with orthopedics to ask if they can call you when ongoing pain medication.  Pain control:  Ibuprofen (motrin/aleve/advil) - You can take 3-4 tablets (600-800 mg) every 6 hours as needed for pain/fever.  Acetaminophen (tylenol) - You can take 2 extra strength tablets (1000 mg) every 6 hours as needed for pain/fever.  You can alternate these medications or take them together.  Make sure you eat food/drink water when taking these medications.   You were given a prescription for narcotic pain medications.  Take only if in severe pain.  These are very addictive medications.  These medications can make you constipated.  If you need to take more than 1-2 doses, start a stool softner.  If you become constipated, take 1 capfull of MiraLAX, can repeat untill having regular bowel movements.  Keep this medication out of reach of any children.

## 2022-05-22 NOTE — ED Triage Notes (Signed)
Pt co of left hip pain due to a chronic issue. Pt states she was supposed to have hip surgery 2 times but each have been re-scheduled. Pt states she ran out of her oxycodone 10mg  last night. Pt is A&Ox4. Pt is able to stand with staff assist x 2.

## 2022-05-28 DIAGNOSIS — M87059 Idiopathic aseptic necrosis of unspecified femur: Secondary | ICD-10-CM | POA: Diagnosis not present

## 2022-06-04 ENCOUNTER — Ambulatory Visit: Payer: Self-pay

## 2022-06-04 ENCOUNTER — Other Ambulatory Visit: Payer: Self-pay | Admitting: Nurse Practitioner

## 2022-06-04 ENCOUNTER — Ambulatory Visit: Payer: 59 | Admitting: Family Medicine

## 2022-06-04 ENCOUNTER — Other Ambulatory Visit: Payer: Self-pay | Admitting: Family Medicine

## 2022-06-04 DIAGNOSIS — B372 Candidiasis of skin and nail: Secondary | ICD-10-CM

## 2022-06-04 DIAGNOSIS — M25562 Pain in left knee: Secondary | ICD-10-CM | POA: Diagnosis not present

## 2022-06-04 DIAGNOSIS — Z96652 Presence of left artificial knee joint: Secondary | ICD-10-CM | POA: Diagnosis not present

## 2022-06-04 DIAGNOSIS — L304 Erythema intertrigo: Secondary | ICD-10-CM

## 2022-06-04 DIAGNOSIS — R21 Rash and other nonspecific skin eruption: Secondary | ICD-10-CM

## 2022-06-04 NOTE — Telephone Encounter (Unsigned)
Copied from CRM [id number]. Topic: General - Other >> Jun 04, 2022 12:33 PM [name] wrote: Reason for CRM: The patient has called to request an order for a new walker sent to [facility]   Please contact the patient further if needed

## 2022-06-04 NOTE — Telephone Encounter (Signed)
FYI

## 2022-06-04 NOTE — Telephone Encounter (Signed)
  Chief Complaint: Left knee pain Symptoms: pain 10/10 Frequency: week and 1/2 Pertinent Negatives: Patient denies fever Disposition: [] ED /[x] Urgent Care (no appt availability in office) / [] Appointment(In office/virtual)/ []  [facility]/ [] Home Care/ [] Refused Recommended Disposition /[] [facility]/ []  Follow-up with PCP Additional Notes: Pt states that her left knee, which was replaced, is painful. Pt also needs left hip replaced.  Pt states she has not pain medication and is taking Tylenol. Pt will get a ride to [facility] for care today.    Reason for Disposition  [1] SEVERE pain (e.g., excruciating, unable to walk) AND [2] not improved after 2 hours of pain medicine  Answer Assessment - Initial Assessment Questions 1. LOCATION and RADIATION: "Where is the pain located?"      Left knee 2. QUALITY: "What does the pain feel like?"  (e.g., sharp, dull, aching, burning)     10/10 3. SEVERITY: "How bad is the pain?" "What does it keep you from doing?"   (Scale 1-10; or mild, moderate, severe)   -  MILD (1-3): doesn't interfere with normal activities    -  MODERATE (4-7): interferes with normal activities (e.g., work or school) or awakens from sleep, limping    -  SEVERE (8-10): excruciating pain, unable to do any normal activities, unable to walk     severe 4. ONSET: "When did the pain start?" "Does it come and go, or is it there all the time?"     A week and 1/2 ago 5. RECURRENT: "Have you had this pain before?" If Yes, ask: "When, and what happened then?"     yes 6. SETTING: "Has there been any recent work, exercise or other activity that involved that part of the body?"      no 7. AGGRAVATING FACTORS: "What makes the knee pain worse?" (e.g., walking, climbing stairs, running)     walking 8. ASSOCIATED SYMPTOMS: "Is there any swelling or redness of the knee?"     swelling 9. OTHER SYMPTOMS: "Do you have any other symptoms?" (e.g., chest pain, difficulty  breathing, fever, calf pain)     no  Protocols used: Knee Pain-A-AH

## 2022-06-04 NOTE — Telephone Encounter (Signed)
Called Nicole Austin from [facility] he states he told pt to contact Orthopedics surgeon office to get the order from them. I have called to notify her she verbalizes understanding. FYI

## 2022-06-05 ENCOUNTER — Emergency Department (HOSPITAL_COMMUNITY): Payer: 59

## 2022-06-05 ENCOUNTER — Other Ambulatory Visit: Payer: Self-pay

## 2022-06-05 ENCOUNTER — Emergency Department (HOSPITAL_COMMUNITY)
Admission: EM | Admit: 2022-06-05 | Discharge: 2022-06-05 | Disposition: A | Discharge status: Home / Self Care | Payer: 59 | Attending: Emergency Medicine | Admitting: Emergency Medicine

## 2022-06-05 DIAGNOSIS — S80919A Unspecified superficial injury of unspecified knee, initial encounter: Secondary | ICD-10-CM | POA: Diagnosis not present

## 2022-06-05 DIAGNOSIS — M25562 Pain in left knee: Secondary | ICD-10-CM | POA: Diagnosis not present

## 2022-06-05 DIAGNOSIS — J449 Chronic obstructive pulmonary disease, unspecified: Secondary | ICD-10-CM | POA: Diagnosis not present

## 2022-06-05 DIAGNOSIS — Z96653 Presence of artificial knee joint, bilateral: Secondary | ICD-10-CM | POA: Diagnosis not present

## 2022-06-05 DIAGNOSIS — M1611 Unilateral primary osteoarthritis, right hip: Secondary | ICD-10-CM | POA: Diagnosis not present

## 2022-06-05 DIAGNOSIS — W1830XA Fall on same level, unspecified, initial encounter: Secondary | ICD-10-CM | POA: Diagnosis not present

## 2022-06-05 DIAGNOSIS — J441 Chronic obstructive pulmonary disease with (acute) exacerbation: Secondary | ICD-10-CM | POA: Diagnosis not present

## 2022-06-05 DIAGNOSIS — M1610 Unilateral primary osteoarthritis, unspecified hip: Secondary | ICD-10-CM

## 2022-06-05 DIAGNOSIS — M160 Bilateral primary osteoarthritis of hip: Secondary | ICD-10-CM | POA: Diagnosis not present

## 2022-06-05 DIAGNOSIS — I10 Essential (primary) hypertension: Secondary | ICD-10-CM | POA: Diagnosis not present

## 2022-06-05 DIAGNOSIS — M1612 Unilateral primary osteoarthritis, left hip: Secondary | ICD-10-CM | POA: Diagnosis not present

## 2022-06-05 DIAGNOSIS — M25551 Pain in right hip: Secondary | ICD-10-CM | POA: Diagnosis present

## 2022-06-05 DIAGNOSIS — W19XXXA Unspecified fall, initial encounter: Secondary | ICD-10-CM

## 2022-06-05 LAB — CBC WITH DIFFERENTIAL/PLATELET
Abs Immature Granulocytes: 0.01 10*3/uL (ref 0.00–0.07)
Basophils Absolute: 0 10*3/uL (ref 0.0–0.1)
Basophils Relative: 0 %
Eosinophils Absolute: 0.2 10*3/uL (ref 0.0–0.5)
Eosinophils Relative: 3 %
HCT: 34.6 % — ABNORMAL LOW (ref 36.0–46.0)
Hemoglobin: 11.7 g/dL — ABNORMAL LOW (ref 12.0–15.0)
Immature Granulocytes: 0 %
Lymphocytes Relative: 29 %
Lymphs Abs: 2.1 10*3/uL (ref 0.7–4.0)
MCH: 30.7 pg (ref 26.0–34.0)
MCHC: 33.8 g/dL (ref 30.0–36.0)
MCV: 90.8 fL (ref 80.0–100.0)
Monocytes Absolute: 0.6 10*3/uL (ref 0.1–1.0)
Monocytes Relative: 9 %
Neutro Abs: 4.2 10*3/uL (ref 1.7–7.7)
Neutrophils Relative %: 59 %
Platelets: 227 10*3/uL (ref 150–400)
RBC: 3.81 MIL/uL — ABNORMAL LOW (ref 3.87–5.11)
RDW: 14.2 % (ref 11.5–15.5)
WBC: 7.2 10*3/uL (ref 4.0–10.5)
nRBC: 0 % (ref 0.0–0.2)

## 2022-06-05 LAB — BASIC METABOLIC PANEL
Anion gap: 8 (ref 5–15)
BUN: 13 mg/dL (ref 8–23)
CO2: 25 mmol/L (ref 22–32)
Calcium: 8.9 mg/dL (ref 8.9–10.3)
Chloride: 94 mmol/L — ABNORMAL LOW (ref 98–111)
Creatinine, Ser: 0.98 mg/dL (ref 0.44–1.00)
GFR, Estimated: >60 mL/min (ref >60)
Glucose, Bld: 87 mg/dL (ref 70–99)
Potassium: 4.4 mmol/L (ref 3.5–5.1)
Sodium: 127 mmol/L — ABNORMAL LOW (ref 135–145)

## 2022-06-05 MED ORDER — FENTANYL CITRATE PF 50 MCG/ML IJ SOSY
50.0000 ug | PREFILLED_SYRINGE | Freq: Once | INTRAMUSCULAR | Status: AC
  Administered 2022-06-05: 50 ug via INTRAVENOUS
  Filled 2022-06-05: qty 1

## 2022-06-05 MED ORDER — OXYCODONE HCL 5 MG PO TABS
10.0000 mg | ORAL_TABLET | Freq: Once | ORAL | Status: AC
  Administered 2022-06-05: 10 mg via ORAL
  Filled 2022-06-05: qty 2

## 2022-06-05 MED ORDER — OXYCODONE HCL 10 MG PO TABS: 10.0000 mg | ORAL_TABLET | Freq: Four times a day (QID) | ORAL | 0 refills | Status: DC | PRN

## 2022-06-05 NOTE — ED Provider Triage Note (Signed)
Emergency Medicine Provider Triage Evaluation Note  Nivaeh Hofland , a 62 y.o. female  was evaluated in triage.  Pt complains of left hip pain. History of AVN, awaiting total hip replacement. She uses a walker for ambulation. No recent falls or injury. Pain intensifying, and not responding to conservative therapy at home   Review of Systems  Positive: Left hip pain Negative: Rotation or shortening.   Physical Exam  BP (!) 156/77   Pulse (!) 56   Temp 98.2 F (36.8 C)   Resp 20   LMP  (LMP Unknown)   SpO2 100%  Gen:   Awake, no distress   Resp:  Normal effort  MSK:   Moves extremities without difficulty. Distal PMS intact  Other:  Pelvis stable  Medical Decision Making  Medically screening exam initiated at 4:16 PM.  Appropriate orders placed.  Jovie Seigel Lentz was informed that the remainder of the evaluation will be completed by another provider, this initial triage assessment does not replace that evaluation, and the importance of remaining in the ED until their evaluation is complete.     Felicie Morn, NP 06/05/22 1621

## 2022-06-05 NOTE — ED Notes (Signed)
Patient Alert and oriented to baseline. Stable and ambulatory to baseline. Patient verbalized understanding of the discharge instructions.  Patient belongings were taken by the patient.   

## 2022-06-05 NOTE — ED Triage Notes (Signed)
C/o left hip pain. Pt reports needing left hip replaced for avascular necrosis.   fentanyl with ems.

## 2022-06-05 NOTE — Discharge Instructions (Signed)
You have hip arthritis and you need to follow-up with Ortho to get a hip replacement  In the meantime, I have refilled your oxycodone as needed.  Please pick up your prescription today  Return to ER if you have another fall, severe pain, unable to walk

## 2022-06-05 NOTE — ED Provider Notes (Signed)
[place] EMERGENCY DEPARTMENT AT [facility] Provider Note   CSN: [id number] Arrival date & time: 06/05/22  1555     History  Chief Complaint  Patient presents with   Hip Pain    Nicole Austin is a 62 y.o. female history of avascular necrosis of the hip, chronic pain, here presenting with fall and needing refill for pain meds.  Patient was seen by me several weeks ago.  She states that she was followed up with Ortho and her hip replacement was cancelled.  She states that 3 to 4 days ago she had a fall and landed on the right hip.  She states that she ran out of her oxycodone and now is just taking Tylenol for pain.  Denies any fevers or chills.  She states that she had another Ortho appointment in a week.  The history is provided by the patient.       Home Medications Prior to Admission medications   Medication Sig Start Date End Date Taking? Authorizing Provider  oxyCODONE 10 MG TABS Take 1 tablet (10 mg total) by mouth every 6 (six) hours as needed for severe pain. 06/05/22  Yes Charlynne Pander, MD  acetaminophen (TYLENOL) 500 MG tablet Take 2 tablets (1,000 mg total) by mouth every 8 (eight) hours as needed. 02/08/22   Danelle Berry, PA-C  albuterol (VENTOLIN HFA) 108 (90 Base) MCG/ACT inhaler Inhale 2 puffs into the lungs every 4 (four) hours as needed for wheezing or shortness of breath. 11/08/21   Danelle Berry, PA-C  ALPRAZolam Prudy Feeler) 0.5 MG tablet Take 1 tablet (0.5 mg total) by mouth 2 (two) times daily. 04/10/22   Marrion Coy, MD  atorvastatin (LIPITOR) 40 MG tablet TAKE 1 TABLET BY MOUTH EVERY NIGHT AT BEDTIME 04/25/21   Margarita Mail, DO  divalproex (DEPAKOTE) 250 MG DR tablet Take 1 tablet (250 mg total) by mouth 2 (two) times daily. 11/19/16   Brandy Hale, MD  escitalopram (LEXAPRO) 20 MG tablet Take 1 tablet (20 mg total) by mouth daily. 01/05/21 05/07/22  [place], Alvira Philips, DO  ibuprofen (ADVIL) 200 MG tablet Take 200 mg by mouth every 6 (six) hours  as needed.    [provider]  lurasidone (LATUDA) 80 MG TABS tablet Take 1 tablet (80 mg total) by mouth daily with breakfast. 06/06/21   Caro Laroche, DO  meloxicam (MOBIC) 15 MG tablet Take 15 mg by mouth daily.    [provider]  mupirocin ointment (BACTROBAN) 2 % Apply small about inside of both nostrils TWICE a day for the next 5 days. 05/07/22   Verlee Monte, NP  nystatin (MYCOSTATIN/NYSTOP) powder APPLY ONE APPLICATION TOPICALLY THREE TIMES DIALY AS NEEDED. (RASH IN SKIN FOLDS) 06/04/22   Danelle Berry, PA-C  nystatin-triamcinolone ointment (MYCOLOG) Apply topically 2 (two) times daily as needed (to affected intact skin with yeast rash). 06/05/22   Danelle Berry, PA-C  OXYGEN Inhale 2 L into the lungs as needed.    [provider]  rOPINIRole (REQUIP XL) 4 MG 24 hr tablet Take 4 mg by mouth at bedtime.    [provider]  senna-docusate (SENOKOT-S) 8.6-50 MG tablet Take 1 tablet by mouth 2 (two) times daily. 04/10/22   Marrion Coy, MD  Tiotropium Bromide Monohydrate (SPIRIVA RESPIMAT) 2.5 MCG/ACT AERS Inhale 2 puffs into the lungs daily. 11/21/21   Sharyn Creamer, MD      Allergies    Chantix [varenicline] and Glycopyrrolate    Review of  Systems   Review of Systems  Musculoskeletal:        Bilateral hip pain  All other systems reviewed and are negative.   Physical Exam Updated Vital Signs BP (!) 156/77   Pulse (!) 56   Temp 98.2 F (36.8 C)   Resp 20   LMP  (LMP Unknown)   SpO2 100%  Physical Exam Vitals and nursing note reviewed.  Constitutional:      Appearance: Normal appearance.     Comments: Chronically ill and uncomfortable  HENT:     Head: Normocephalic.     Nose: Nose normal.     Mouth/Throat:     Mouth: Mucous membranes are moist.  Eyes:     Pupils: Pupils are equal, round, and reactive to light.  Cardiovascular:     Rate and Rhythm: Normal rate and regular rhythm.     Pulses: Normal pulses.     Heart sounds: Normal heart  sounds.  Pulmonary:     Effort: Pulmonary effort is normal.     Breath sounds: Normal breath sounds.  Abdominal:     General: Abdomen is flat.     Palpations: Abdomen is soft.  Musculoskeletal:     Cervical back: Normal range of motion and neck supple.     Comments: Mild tenderness of bilateral hips but normal range of motion.  Patient has bilateral knee replacements with no signs of trauma  Skin:    General: Skin is warm.  Neurological:     General: No focal deficit present.     Mental Status: She is alert and oriented to person, place, and time.  Psychiatric:        Mood and Affect: Mood normal.     ED Results / Procedures / Treatments   Labs (all labs ordered are listed, but only abnormal results are displayed) Labs Reviewed  CBC WITH DIFFERENTIAL/PLATELET - Abnormal; Notable for the following components:      Result Value   RBC 3.81 (*)    Hemoglobin 11.7 (*)    HCT 34.6 (*)    All other components within normal limits  BASIC METABOLIC PANEL - Abnormal; Notable for the following components:   Sodium 127 (*)    Chloride 94 (*)    All other components within normal limits    EKG None  Radiology DG Hip Unilat W or Wo Pelvis 2-3 Views Left  Result Date: 06/05/2022 CLINICAL DATA:  Left pain, history of avascular necrosis. EXAM: DG HIP (WITH OR WITHOUT PELVIS) 2-3V LEFT COMPARISON:  CT examination dated May 17, 2022 FINDINGS: Subchondral collapse of the left femoral head with subchondral sclerosis and near complete loss of the joint space suggesting osteoarthritis in the settings of chronic avascular necrosis. Findings are not significantly changed since recent prior examination. Mild sclerosis of the right femoral head. Degenerative disc disease of the lower lumbar spine. No displaced pelvic fracture. IMPRESSION: Moderate osteoarthritis of the left hip in the settings of AVN with subchondral collapse. Findings are similar to recent prior CT examination. No acute fracture.  Electronically Signed   By: Larose Hires D.O.   On: 06/05/2022 16:57    Procedures Procedures    Medications Ordered in ED Medications  oxyCODONE (Oxy IR/ROXICODONE) immediate release tablet 10 mg (has no administration in time range)  fentaNYL (SUBLIMAZE) injection 50 mcg (50 mcg Intravenous Given 06/05/22 1626)    ED Course/ Medical Decision Making/ A&P  Medical Decision Making Nicole Austin is a 62 y.o. female here presenting with bilateral hip pain.  This is a known issue.  She has avascular necrosis of the hip.  Her x-rays today did not show any fractures.  I refilled her oxycodone and told her to follow-up with her orthopedic doctor as scheduled on Tuesday.   Problems Addressed: Fall, initial encounter: acute illness or injury Hip arthritis: chronic illness or injury  Risk Prescription drug management.    Final Clinical Impression(s) / ED Diagnoses Final diagnoses:  Fall, initial encounter  Hip arthritis    Rx / DC Orders ED Discharge Orders          Ordered    oxyCODONE 10 MG TABS  Every 6 hours PRN        06/05/22 1755              Charlynne PanderYao, Deyra Perdomo Hsienta, MD 06/05/22 1759

## 2022-06-05 NOTE — Telephone Encounter (Signed)
Requested medication (s) are due for refill today - yes  Requested medication (s) are on the active medication list -yes  Future visit scheduled -no  Last refill: 11/13/21 30g  Notes to clinic: off protocol - provider review   Requested Prescriptions  Pending Prescriptions Disp Refills   nystatin-triamcinolone ointment (MYCOLOG) [Pharmacy Med Name: NYSTATIN/TRIAMCINOLONE ACETONIDE  OINTMENT] 30 g 0    Sig: APPLY ONE APPLICATION TOPCIALLY TWO TIMES DAILY     Off-Protocol Failed - 06/04/2022  2:08 PM      Failed - Medication not assigned to a protocol, review manually.      Passed - Valid encounter within last 12 months    Recent Outpatient Visits           3 weeks ago Encounter for preoperative assessment   [facility] Danelle Berry, PA-C   1 month ago Encounter for preoperative assessment   [place] [facility] Danelle Berry, PA-C   1 month ago Encounter for examination following treatment at hospital   [facility] Danelle Berry, PA-C   2 months ago Recurrent cold sores   [place] [facility] Mecum, Oswaldo [place], PA-C   2 months ago Medicare annual wellness visit, subsequent   [facility] Caro Laroche, DO                 Requested Prescriptions  Pending Prescriptions Disp Refills   nystatin-triamcinolone ointment (MYCOLOG) [Pharmacy Med Name: NYSTATIN/TRIAMCINOLONE ACETONIDE  OINTMENT] 30 g 0    Sig: APPLY ONE APPLICATION TOPCIALLY TWO TIMES DAILY     Off-Protocol Failed - 06/04/2022  2:08 PM      Failed - Medication not assigned to a protocol, review manually.      Passed - Valid encounter within last 12 months    Recent Outpatient Visits           3 weeks ago Encounter for preoperative assessment   [facility] Danelle Berry, PA-C   1 month ago Encounter for preoperative assessment   [facility]  Danelle Berry, PA-C   1 month ago Encounter for examination following treatment at hospital   [facility] Danelle Berry, PA-C   2 months ago Recurrent cold sores   [place] [facility] Mecum, Oswaldo [place], PA-C   2 months ago Medicare annual wellness visit, subsequent   [facility] Caro Laroche, [place]

## 2022-06-07 ENCOUNTER — Other Ambulatory Visit: Payer: Self-pay | Admitting: Family Medicine

## 2022-06-07 DIAGNOSIS — M87852 Other osteonecrosis, left femur: Secondary | ICD-10-CM | POA: Diagnosis not present

## 2022-06-07 DIAGNOSIS — J441 Chronic obstructive pulmonary disease with (acute) exacerbation: Secondary | ICD-10-CM | POA: Diagnosis not present

## 2022-06-07 DIAGNOSIS — J449 Chronic obstructive pulmonary disease, unspecified: Secondary | ICD-10-CM | POA: Diagnosis not present

## 2022-06-07 DIAGNOSIS — M25552 Pain in left hip: Secondary | ICD-10-CM | POA: Diagnosis not present

## 2022-06-09 ENCOUNTER — Other Ambulatory Visit: Payer: Self-pay

## 2022-06-09 ENCOUNTER — Emergency Department (HOSPITAL_COMMUNITY)
Admission: EM | Admit: 2022-06-09 | Discharge: 2022-06-09 | Disposition: A | Discharge status: Home / Self Care | Payer: 59 | Attending: Emergency Medicine | Admitting: Emergency Medicine

## 2022-06-09 ENCOUNTER — Encounter (HOSPITAL_COMMUNITY): Payer: Self-pay | Admitting: Pharmacy Technician

## 2022-06-09 DIAGNOSIS — E119 Type 2 diabetes mellitus without complications: Secondary | ICD-10-CM | POA: Insufficient documentation

## 2022-06-09 DIAGNOSIS — B379 Candidiasis, unspecified: Secondary | ICD-10-CM | POA: Diagnosis not present

## 2022-06-09 DIAGNOSIS — B372 Candidiasis of skin and nail: Secondary | ICD-10-CM | POA: Diagnosis not present

## 2022-06-09 DIAGNOSIS — J449 Chronic obstructive pulmonary disease, unspecified: Secondary | ICD-10-CM | POA: Insufficient documentation

## 2022-06-09 DIAGNOSIS — Z743 Need for continuous supervision: Secondary | ICD-10-CM | POA: Diagnosis not present

## 2022-06-09 DIAGNOSIS — R21 Rash and other nonspecific skin eruption: Secondary | ICD-10-CM | POA: Diagnosis present

## 2022-06-09 DIAGNOSIS — L089 Local infection of the skin and subcutaneous tissue, unspecified: Secondary | ICD-10-CM | POA: Diagnosis not present

## 2022-06-09 DIAGNOSIS — R6889 Other general symptoms and signs: Secondary | ICD-10-CM | POA: Diagnosis not present

## 2022-06-09 MED ORDER — FLUCONAZOLE 50 MG PO TABS: 50.0000 mg | ORAL_TABLET | Freq: Once | ORAL | Status: DC

## 2022-06-09 MED ORDER — IBUPROFEN 200 MG PO TABS
600.0000 mg | ORAL_TABLET | Freq: Once | ORAL | Status: AC
  Administered 2022-06-09: 600 mg via ORAL
  Filled 2022-06-09: qty 3

## 2022-06-09 MED ORDER — FLUCONAZOLE 100 MG PO TABS: 100.0000 mg | ORAL_TABLET | Freq: Once | ORAL | Status: DC

## 2022-06-09 MED ORDER — NYSTATIN 100000 UNIT/GM EX POWD
Freq: Once | CUTANEOUS | Status: AC
  Filled 2022-06-09: qty 15

## 2022-06-09 MED ORDER — NYSTATIN 100000 UNIT/GM EX POWD: 1.0000 | Freq: Three times a day (TID) | CUTANEOUS | 0 refills | Status: DC

## 2022-06-09 MED ORDER — FLUCONAZOLE 150 MG PO TABS
150.0000 mg | ORAL_TABLET | Freq: Once | ORAL | Status: AC
  Administered 2022-06-09: 150 mg via ORAL
  Filled 2022-06-09: qty 1

## 2022-06-09 MED ORDER — FLUCONAZOLE 150 MG PO TABS: 150.0000 mg | ORAL_TABLET | Freq: Every day | ORAL | 0 refills | Status: AC

## 2022-06-09 NOTE — ED Triage Notes (Signed)
Pt  bib ems from home with redness and irritation along the panty line. Pt using topical OTC meds to try to treat for yeast. VSS with ems.

## 2022-06-09 NOTE — ED Notes (Signed)
Patient Alert and oriented to baseline. Stable and ambulatory to baseline. Patient verbalized understanding of the discharge instructions.  Patient belongings were taken by the patient.   

## 2022-06-09 NOTE — Discharge Instructions (Addendum)
Evaluation of your rash revealed that you likely have a yeast infection.  I am refilling your nystatin and starting on Diflucan which is an antifungal oral medication.  Advised that you continue to apply the nystatin you can also use baby powder with your nystatin with the goal to keep the area dry.  Also recommend you follow-up with your PCP in 2 to 3 days for reevaluation.  Please return if you develop fever, or any other concerning symptom.

## 2022-06-09 NOTE — ED Provider Notes (Signed)
[place] EMERGENCY DEPARTMENT AT [facility] Provider Note   CSN: [id number] Arrival date & time: 06/09/22  6606     History  Chief Complaint  Patient presents with   Skin Irritation   HPI Nicole Austin is a 62 y.o. female with COPD, prediabetes, hyperlipidemia, history of MRSA infection presenting for rash.  She noticed a rash about a week ago.  It is primarily localized about her "panty line" predominantly on the right side extending into her lower abdomen.  Was initially pruritic but now burning and painful.  She has used topical nystatin with no improvement.  Denies fever and chills at home.  HPI     Home Medications Prior to Admission medications   Medication Sig Start Date End Date Taking? Authorizing Provider  fluconazole (DIFLUCAN) 150 MG tablet Take 1 tablet (150 mg total) by mouth daily for 7 days. 06/09/22 06/16/22 Yes Gareth Eagle, PA-C  nystatin (MYCOSTATIN/NYSTOP) powder Apply 1 Application topically 3 (three) times daily. 06/09/22  Yes Gareth Eagle, PA-C  acetaminophen (TYLENOL) 500 MG tablet Take 2 tablets (1,000 mg total) by mouth every 8 (eight) hours as needed. 02/08/22   Danelle Berry, PA-C  albuterol (VENTOLIN HFA) 108 (90 Base) MCG/ACT inhaler Inhale 2 puffs into the lungs every 4 (four) hours as needed for wheezing or shortness of breath. 11/08/21   Danelle Berry, PA-C  ALPRAZolam Prudy Feeler) 0.5 MG tablet Take 1 tablet (0.5 mg total) by mouth 2 (two) times daily. 04/10/22   Marrion Coy, MD  atorvastatin (LIPITOR) 40 MG tablet TAKE 1 TABLET BY MOUTH EVERY NIGHT AT BEDTIME 04/25/21   Margarita Mail, DO  divalproex (DEPAKOTE) 250 MG DR tablet Take 1 tablet (250 mg total) by mouth 2 (two) times daily. 11/19/16   Brandy Hale, MD  escitalopram (LEXAPRO) 20 MG tablet Take 1 tablet (20 mg total) by mouth daily. 01/05/21 05/07/22  [place], Alvira Philips, DO  ibuprofen (ADVIL) 200 MG tablet Take 200 mg by mouth every 6 (six) hours as needed.    [provider]  lurasidone (LATUDA) 80 MG TABS tablet Take 1 tablet (80 mg total) by mouth daily with breakfast. 06/06/21   Caro Laroche, DO  meloxicam (MOBIC) 15 MG tablet Take 15 mg by mouth daily.    [provider]  mupirocin ointment (BACTROBAN) 2 % Apply small about inside of both nostrils TWICE a day for the next 5 days. 05/07/22   Verlee Monte, NP  nystatin-triamcinolone ointment [facility]) Apply topically 2 (two) times daily as needed (to affected intact skin with yeast rash). 06/05/22   Danelle Berry, PA-C  oxyCODONE 10 MG TABS Take 1 tablet (10 mg total) by mouth every 6 (six) hours as needed for severe pain. 06/05/22   Charlynne Pander, MD  OXYGEN Inhale 2 L into the lungs as needed.    [provider]  rOPINIRole (REQUIP XL) 4 MG 24 hr tablet Take 4 mg by mouth at bedtime.    [provider]  senna-docusate (SENOKOT-S) 8.6-50 MG tablet Take 1 tablet by mouth 2 (two) times daily. 04/10/22   Marrion Coy, MD  Tiotropium Bromide Monohydrate (SPIRIVA RESPIMAT) 2.5 MCG/ACT AERS Inhale 2 puffs into the lungs daily. 11/21/21   Sharyn Creamer, MD      Allergies    Chantix [varenicline] and Glycopyrrolate    Review of Systems   See HPI for pertinent positives  Physical Exam Updated Vital Signs BP 137/65   Pulse 62  Temp (!) 97.3 F (36.3 C) (Oral)   Resp 18   LMP  (LMP Unknown)   SpO2 95%  Physical Exam Constitutional:      Appearance: Normal appearance.  HENT:     Head: Normocephalic.     Nose: Nose normal.  Eyes:     Conjunctiva/sclera: Conjunctivae normal.  Pulmonary:     Effort: Pulmonary effort is normal.  Skin:      Neurological:     Mental Status: She is alert.  Psychiatric:        Mood and Affect: Mood normal.     ED Results / Procedures / Treatments   Labs (all labs ordered are listed, but only abnormal results are displayed) Labs Reviewed - No data to display  EKG None  Radiology No results found.  Procedures Procedures     Medications Ordered in ED Medications  nystatin (MYCOSTATIN/NYSTOP) topical powder (has no administration in time range)  fluconazole (DIFLUCAN) tablet 150 mg (has no administration in time range)  ibuprofen (ADVIL) tablet 600 mg (has no administration in time range)    ED Course/ Medical Decision Making/ A&P                             Medical Decision Making  62 year old female who is well-appearing presenting for rash.  Exam notable for well-demarcated red rash primarily in her right groin area.  DDx includes candidiasis, tinea, cellulitis, other fungal infection.  Symptoms and clinical findings consistent with likely candidiasis.  Patient has been using topical nystatin at home.  Refilled her nystatin and started her on Diflucan.  Treat her pain with ibuprofen.  Advised to follow-up with her PCP.  Discussed pertinent return precautions.  Vital stable at discharge.        Final Clinical Impression(s) / ED Diagnoses Final diagnoses:  Candidiasis    Rx / DC Orders ED Discharge Orders          Ordered    nystatin (MYCOSTATIN/NYSTOP) powder  3 times daily        06/09/22 1047    fluconazole (DIFLUCAN) 150 MG tablet  Daily        06/09/22 1047              Gareth Eagle, PA-C 06/09/22 1053    Terald Sleeper, MD 06/09/22 1506

## 2022-06-12 ENCOUNTER — Emergency Department (HOSPITAL_COMMUNITY)
Admission: EM | Admit: 2022-06-12 | Discharge: 2022-06-12 | Disposition: A | Discharge status: Home / Self Care | Payer: 59 | Attending: Emergency Medicine | Admitting: Emergency Medicine

## 2022-06-12 ENCOUNTER — Other Ambulatory Visit: Payer: Self-pay

## 2022-06-12 ENCOUNTER — Encounter (HOSPITAL_COMMUNITY): Payer: Self-pay

## 2022-06-12 ENCOUNTER — Ambulatory Visit: Payer: Self-pay

## 2022-06-12 ENCOUNTER — Telehealth: Payer: Self-pay

## 2022-06-12 DIAGNOSIS — B379 Candidiasis, unspecified: Secondary | ICD-10-CM | POA: Diagnosis not present

## 2022-06-12 DIAGNOSIS — R9431 Abnormal electrocardiogram [ECG] [EKG]: Secondary | ICD-10-CM | POA: Diagnosis not present

## 2022-06-12 DIAGNOSIS — M25552 Pain in left hip: Secondary | ICD-10-CM | POA: Insufficient documentation

## 2022-06-12 DIAGNOSIS — J449 Chronic obstructive pulmonary disease, unspecified: Secondary | ICD-10-CM | POA: Insufficient documentation

## 2022-06-12 DIAGNOSIS — E871 Hypo-osmolality and hyponatremia: Secondary | ICD-10-CM | POA: Diagnosis not present

## 2022-06-12 DIAGNOSIS — I10 Essential (primary) hypertension: Secondary | ICD-10-CM | POA: Diagnosis not present

## 2022-06-12 DIAGNOSIS — B3749 Other urogenital candidiasis: Secondary | ICD-10-CM | POA: Diagnosis not present

## 2022-06-12 DIAGNOSIS — R6889 Other general symptoms and signs: Secondary | ICD-10-CM | POA: Diagnosis not present

## 2022-06-12 DIAGNOSIS — R279 Unspecified lack of coordination: Secondary | ICD-10-CM | POA: Diagnosis not present

## 2022-06-12 DIAGNOSIS — G8929 Other chronic pain: Secondary | ICD-10-CM | POA: Insufficient documentation

## 2022-06-12 DIAGNOSIS — Z743 Need for continuous supervision: Secondary | ICD-10-CM | POA: Diagnosis not present

## 2022-06-12 DIAGNOSIS — R21 Rash and other nonspecific skin eruption: Secondary | ICD-10-CM | POA: Diagnosis present

## 2022-06-12 LAB — URINALYSIS, ROUTINE W REFLEX MICROSCOPIC
Bilirubin Urine: NEGATIVE
Glucose, UA: NEGATIVE mg/dL
Hgb urine dipstick: NEGATIVE
Ketones, ur: NEGATIVE mg/dL
Nitrite: NEGATIVE
Protein, ur: NEGATIVE mg/dL
Specific Gravity, Urine: 1.023 (ref 1.005–1.030)
pH: 6 (ref 5.0–8.0)

## 2022-06-12 LAB — CBC WITH DIFFERENTIAL/PLATELET
Abs Immature Granulocytes: 0.01 10*3/uL (ref 0.00–0.07)
Basophils Absolute: 0.1 10*3/uL (ref 0.0–0.1)
Basophils Relative: 1 %
Eosinophils Absolute: 0.3 10*3/uL (ref 0.0–0.5)
Eosinophils Relative: 5 %
HCT: 32.7 % — ABNORMAL LOW (ref 36.0–46.0)
Hemoglobin: 10.8 g/dL — ABNORMAL LOW (ref 12.0–15.0)
Immature Granulocytes: 0 %
Lymphocytes Relative: 31 %
Lymphs Abs: 2.2 10*3/uL (ref 0.7–4.0)
MCH: 30.4 pg (ref 26.0–34.0)
MCHC: 33 g/dL (ref 30.0–36.0)
MCV: 92.1 fL (ref 80.0–100.0)
Monocytes Absolute: 0.7 10*3/uL (ref 0.1–1.0)
Monocytes Relative: 9 %
Neutro Abs: 3.9 10*3/uL (ref 1.7–7.7)
Neutrophils Relative %: 54 %
Platelets: 238 10*3/uL (ref 150–400)
RBC: 3.55 MIL/uL — ABNORMAL LOW (ref 3.87–5.11)
RDW: 14.4 % (ref 11.5–15.5)
WBC: 7.2 10*3/uL (ref 4.0–10.5)
nRBC: 0 % (ref 0.0–0.2)

## 2022-06-12 LAB — BASIC METABOLIC PANEL
Anion gap: 7 (ref 5–15)
BUN: 22 mg/dL (ref 8–23)
CO2: 28 mmol/L (ref 22–32)
Calcium: 8.7 mg/dL — ABNORMAL LOW (ref 8.9–10.3)
Chloride: 91 mmol/L — ABNORMAL LOW (ref 98–111)
Creatinine, Ser: 1.11 mg/dL — ABNORMAL HIGH (ref 0.44–1.00)
GFR, Estimated: 56 mL/min — ABNORMAL LOW (ref >60)
Glucose, Bld: 87 mg/dL (ref 70–99)
Potassium: 4.7 mmol/L (ref 3.5–5.1)
Sodium: 126 mmol/L — ABNORMAL LOW (ref 135–145)

## 2022-06-12 MED ORDER — FLUCONAZOLE 200 MG PO TABS
400.0000 mg | ORAL_TABLET | Freq: Once | ORAL | Status: AC
  Administered 2022-06-12: 400 mg via ORAL
  Filled 2022-06-12: qty 2

## 2022-06-12 MED ORDER — HYDROCODONE-ACETAMINOPHEN 5-325 MG PO TABS
1.0000 | ORAL_TABLET | Freq: Once | ORAL | Status: AC
  Administered 2022-06-12: 1 via ORAL
  Filled 2022-06-12: qty 1

## 2022-06-12 MED ORDER — OXYCODONE-ACETAMINOPHEN 5-325 MG PO TABS
1.0000 | ORAL_TABLET | Freq: Once | ORAL | Status: AC
  Administered 2022-06-12: 1 via ORAL
  Filled 2022-06-12: qty 1

## 2022-06-12 MED ORDER — FLUCONAZOLE 200 MG PO TABS: 200.0000 mg | ORAL_TABLET | Freq: Every day | ORAL | 0 refills | Status: AC

## 2022-06-12 MED ORDER — NYSTATIN 100000 UNIT/GM EX POWD: 1.0000 | Freq: Three times a day (TID) | CUTANEOUS | 0 refills | Status: DC

## 2022-06-12 NOTE — ED Provider Notes (Signed)
[place] EMERGENCY DEPARTMENT AT [facility] Provider Note   CSN: [id number] Arrival date & time: 06/12/22  1710     History  No chief complaint on file.   Nicole Austin is a 62 y.o. female with a past medical history significant for COPD, prediabetes, hyperlipidemia, history of MRSA who presents to the ED due to rash to groin area.  Patient states rash is located in groin region.  She admits to a burning sensation.  Admits to significant pain with palpation.  Seen in the ED on 4/13 and was discharged with nystatin and fluconazole.  Patient states symptoms have worsened.  No fever.  Also admits to acute on chronic left hip pain.  She notes she is due for a left hip replacement.  No recent injury.  History obtained from patient and past medical records. No interpreter used during encounter.       Home Medications Prior to Admission medications   Medication Sig Start Date End Date Taking? Authorizing Provider  fluconazole (DIFLUCAN) 200 MG tablet Take 1 tablet (200 mg total) by mouth daily for 7 days. 06/12/22 06/19/22 Yes Ananiah Maciolek, Merla Riches, PA-C  acetaminophen (TYLENOL) 500 MG tablet Take 2 tablets (1,000 mg total) by mouth every 8 (eight) hours as needed. 02/08/22   Danelle Berry, PA-C  albuterol (VENTOLIN HFA) 108 (90 Base) MCG/ACT inhaler Inhale 2 puffs into the lungs every 4 (four) hours as needed for wheezing or shortness of breath. 11/08/21   Danelle Berry, PA-C  ALPRAZolam Prudy Feeler) 0.5 MG tablet Take 1 tablet (0.5 mg total) by mouth 2 (two) times daily. 04/10/22   Marrion Coy, MD  atorvastatin (LIPITOR) 40 MG tablet TAKE 1 TABLET BY MOUTH EVERY NIGHT AT BEDTIME 04/25/21   Margarita Mail, DO  divalproex (DEPAKOTE) 250 MG DR tablet Take 1 tablet (250 mg total) by mouth 2 (two) times daily. 11/19/16   Brandy Hale, MD  escitalopram (LEXAPRO) 20 MG tablet Take 1 tablet (20 mg total) by mouth daily. 01/05/21 05/07/22  [place], Alvira Philips, DO  fluconazole (DIFLUCAN) 150 MG  tablet Take 1 tablet (150 mg total) by mouth daily for 7 days. 06/09/22 06/16/22  Gareth Eagle, PA-C  ibuprofen (ADVIL) 200 MG tablet Take 200 mg by mouth every 6 (six) hours as needed.    [provider]  lurasidone (LATUDA) 80 MG TABS tablet Take 1 tablet (80 mg total) by mouth daily with breakfast. 06/06/21   Caro Laroche, DO  meloxicam (MOBIC) 15 MG tablet Take 15 mg by mouth daily.    [provider]  mupirocin ointment (BACTROBAN) 2 % Apply small about inside of both nostrils TWICE a day for the next 5 days. 05/07/22   Verlee Monte, NP  nystatin (MYCOSTATIN/NYSTOP) powder Apply 1 Application topically 3 (three) times daily. 06/09/22   Gareth Eagle, PA-C  nystatin-triamcinolone ointment [facility]) Apply topically 2 (two) times daily as needed (to affected intact skin with yeast rash). 06/05/22   Danelle Berry, PA-C  oxyCODONE 10 MG TABS Take 1 tablet (10 mg total) by mouth every 6 (six) hours as needed for severe pain. 06/05/22   Charlynne Pander, MD  OXYGEN Inhale 2 L into the lungs as needed.    [provider]  rOPINIRole (REQUIP XL) 4 MG 24 hr tablet Take 4 mg by mouth at bedtime.    [provider]  senna-docusate (SENOKOT-S) 8.6-50 MG tablet Take 1 tablet by mouth 2 (two) times daily. 04/10/22   Chipper Herb,  Dek[place], MD  Tiotropium Bromide Monohydrate (SPIRIVA RESPIMAT) 2.5 MCG/ACT AERS Inhale 2 puffs into the lungs daily. 11/21/21   Sharyn Creamer, MD      Allergies    Chantix [varenicline] and Glycopyrrolate    Review of Systems   Review of Systems  Constitutional:  Negative for fever.  Musculoskeletal:  Positive for arthralgias.  Skin:  Positive for rash.    Physical Exam Updated Vital Signs BP (!) 153/67   Pulse (!) 57   Temp 97.6 F (36.4 C)   Resp 14   LMP  (LMP Unknown)   SpO2 97%  Physical Exam Vitals and nursing note reviewed.  Constitutional:      General: She is not in acute distress.    Appearance: She is not ill-appearing.   HENT:     Head: Normocephalic.  Eyes:     Pupils: Pupils are equal, round, and reactive to light.  Cardiovascular:     Rate and Rhythm: Normal rate and regular rhythm.     Pulses: Normal pulses.     Heart sounds: Normal heart sounds. No murmur heard.    No friction rub. No gallop.  Pulmonary:     Effort: Pulmonary effort is normal.     Breath sounds: Normal breath sounds.  Abdominal:     General: Abdomen is flat. There is no distension.     Palpations: Abdomen is soft.     Tenderness: There is no abdominal tenderness. There is no guarding or rebound.  Musculoskeletal:        General: Normal range of motion.     Cervical back: Neck supple.  Skin:    General: Skin is warm and dry.     Comments: Erythema to groin with yeast. See photo below.  Neurological:     General: No focal deficit present.     Mental Status: She is alert.  Psychiatric:        Mood and Affect: Mood normal.        Behavior: Behavior normal.     ED Results / Procedures / Treatments   Labs (all labs ordered are listed, but only abnormal results are displayed) Labs Reviewed  CBC WITH DIFFERENTIAL/PLATELET - Abnormal; Notable for the following components:      Result Value   RBC 3.55 (*)    Hemoglobin 10.8 (*)    HCT 32.7 (*)    All other components within normal limits  BASIC METABOLIC PANEL - Abnormal; Notable for the following components:   Sodium 126 (*)    Chloride 91 (*)    Creatinine, Ser 1.11 (*)    Calcium 8.7 (*)    GFR, Estimated 56 (*)    All other components within normal limits  URINALYSIS, ROUTINE W REFLEX MICROSCOPIC - Abnormal; Notable for the following components:   Leukocytes,Ua MODERATE (*)    Bacteria, UA RARE (*)    All other components within normal limits    EKG None  Radiology No results found.  Procedures Procedures    Medications Ordered in ED Medications  oxyCODONE-acetaminophen (PERCOCET/ROXICET) 5-325 MG per tablet 1 tablet (1 tablet Oral Given 06/12/22  1823)  fluconazole (DIFLUCAN) tablet 400 mg (400 mg Oral Given 06/12/22 2054)    ED Course/ Medical Decision Making/ A&P                             Medical Decision Making Amount and/or Complexity of Data Reviewed External Data Reviewed: notes.  Labs: ordered. Decision-making details documented in ED Course. ECG/medicine tests: ordered and independent interpretation performed. Decision-making details documented in ED Course.  Risk Prescription drug management.   62 year old female presents to the ED due to yeast infection to groin area.  Patient has been using fluconazole and nystatin powder with no relief. Seen in ED 3 days ago for same.  No fever or chills.  Also admits to acute on chronic left hip pain.  Patient states she needs a hip replacement and is in chronic pain.  No recent injury.  Denies edema and erythema to left hip.  Upon arrival, stable vitals.  Patient in no acute distress.  Erythema to right groin region as noted above consistent with candidal infection. No evidence of superimposed bacterial infection. Left hip without erythema or edema.  Some bony tenderness.  No recent injury to suggest bony fracture. History of avascular necrosis, awaiting hip replacement. Low suspicion for septic joint.  Will obtain routine labs given significant candidiasis to rule out significant leukocytosis.   Discussed with Kenard Gower from pharmacy who recommends increasing fluconazole to one dose of  and then sending patient home with  daily x1 week. Recheck in a few days. EKG with Qtc 423. NSR  8:50 PM reassessed patient at bedside.  Patient resting comfortably in bed.  Admits to some improvement in hip pain after pain medication.  Patient stable for discharge.  Advised patient to follow-up with PCP in 2 days for recheck or return if worsening.  Also advised patient to have labs drawn in the next few days to check sodium level.  BMP significant for hyponatremia at 126 which appears to be around  patient's baseline.  Unknown etiology of hyponatremia.  Elevated creatinine 1.1. CBC with anemia with hemoglobin at 10.8.  No leukocytosis. Patient stable for discharge. Strict ED precautions discussed with patient. Patient states understanding and agrees to plan. Patient discharged home in no acute distress and stable vitals  Has PCP        Final Clinical Impression(s) / ED Diagnoses Final diagnoses:  Candidiasis  Hyponatremia  Chronic left hip pain    Rx / DC Orders ED Discharge Orders          Ordered    fluconazole (DIFLUCAN) 200 MG tablet  Daily        06/12/22 2107              Jesusita Oka 06/12/22 2112    Pricilla Loveless, MD 06/12/22 2348

## 2022-06-12 NOTE — Telephone Encounter (Signed)
     Patient  visit on 4/13  at [facility]  Have you been able to follow up with your primary care physician? Yes      The patient was or was not able to obtain any needed medicine or equipment. Yes   Are there diet recommendations that you are having difficulty following? Na   Patient expresses understanding of discharge instructions and education provided has no other needs at this time.  Yes     Lenard Forth [facility] Guide, [facility] [phone] [address] [place], [place], [place] [address] Phone: [phone] Email: [email or web address]

## 2022-06-12 NOTE — Discharge Instructions (Addendum)
It was a pleasure taking care of you today.  As discussed, I have increased your fluconazole.  Please take 200 mg daily.  Please follow-up with PCP in 2 days for a recheck.  Your sodium was low however, appears to always be low.  Please have your labs rechecked within 1 week.  Return to the ER for any worsening symptoms.  Continue using nystatin powder 4x daily.

## 2022-06-12 NOTE — Telephone Encounter (Signed)
Chief Complaint: Rash to right groin, skin infection Symptoms: Pain 20, burning, odor, bleeding Frequency: 1 week ago, getting worse Pertinent Negatives: Patient denies fever, other symptoms Disposition: [] ED /[] Urgent Care (no appt availability in office) / [x] Appointment(In office/virtual)/ []  [facility]/ [] Home Care/ [] Refused Recommended Disposition /[] [facility]/ []  Follow-up with PCP Additional Notes: N/A   Reason for Disposition  Genital area rash  Answer Assessment - Initial Assessment Questions 1. APPEARANCE of RASH: "Describe the rash."      Red, bleeding 2. LOCATION: "Where is the rash located?"      Right groin 3. ONSET: "When did the rash start?"      1 week ago it started 6. ITCHING: "Does the rash itch?" If Yes, ask: "How bad is the itch?"  (Scale 0-10; or none, mild, moderate, severe)     No 7. PAIN: "Does the rash hurt?" If Yes, ask: "How bad is the pain?"  (Scale 0-10; or none, mild, moderate, severe)    - NONE (0): no pain    - MILD (1-3): doesn't interfere with normal activities     - MODERATE (4-7): interferes with normal activities or awakens from sleep     - SEVERE (8-10): excruciating pain, unable to do any normal activities     20 constantly burns, stings 8. OTHER SYMPTOMS: "Do you have any other symptoms?" (e.g., fever)     Just don't feel good, not sure if it's from the pain  Protocols used: Rash or Redness - Localized-A-AH

## 2022-06-12 NOTE — ED Triage Notes (Signed)
Arrived via EMS, recently seen for external yeast infection, oral and topical abx.. no relief, bad hip on left side/chronic pain.

## 2022-06-13 ENCOUNTER — Ambulatory Visit: Payer: 59 | Admitting: Family Medicine

## 2022-06-13 NOTE — Progress Notes (Deleted)
Patient ID: Nicole Austin, female    DOB: [birth year], 62 y.o.   MRN: [id number]  PCP: Danelle Berry, PA-C  No chief complaint on file.   Subjective:   Nicole Austin is a 62 y.o. female, presents to clinic with CC of the following:  HPI    Patient Active Problem List   Diagnosis Date Noted   History of methicillin resistant staphylococcus aureus (MRSA) 05/03/2022   Avascular necrosis of bones of both hips 05/03/2022   Chronic prescription benzodiazepine use 05/03/2022   Polypharmacy 04/09/2022   Sinus bradycardia 04/09/2022   Depression with anxiety 04/09/2022   AVN of femur 03/21/2022   MRSA (methicillin resistant staph aureus) culture positive 05/04/2021   Prolonged QT interval 01/01/2021   Benign neoplasm of ascending colon    Polyp of sigmoid colon    Bipolar disorder with depression 02/25/2017   ADD (attention deficit disorder) 02/25/2017   Marijuana use 02/13/2017   Neuropathy, peripheral 05/07/2016   Vitamin B12 deficiency 10/24/2015   Fibromyalgia 08/30/2015   GERD (gastroesophageal reflux disease) 08/30/2015   OP (osteoporosis) 06/30/2015   Sleep apnea 06/30/2015   HLD (hyperlipidemia) 05/13/2015   Morbid (severe) obesity due to excess calories 08/16/2014   Low back pain with sciatica 08/04/2014   Polysubstance abuse 07/04/2014   Anxiety, generalized 11/24/2013   COPD (chronic obstructive pulmonary disease) 11/18/2013   Arthritis of knee, degenerative 07/15/2013   Osteoarthritis of knee, unspecified 07/15/2013      Current Outpatient Medications:    acetaminophen (TYLENOL) 500 MG tablet, Take 2 tablets (1,000 mg total) by mouth every 8 (eight) hours as needed., Disp: 90 tablet, Rfl: 0   albuterol (VENTOLIN HFA) 108 (90 Base) MCG/ACT inhaler, Inhale 2 puffs into the lungs every 4 (four) hours as needed for wheezing or shortness of breath., Disp: 18 g, Rfl: 2   ALPRAZolam (XANAX) 0.5 MG tablet, Take 1 tablet (0.5 mg total) by mouth 2 (two)  times daily., Disp: , Rfl: 0   atorvastatin (LIPITOR) 40 MG tablet, TAKE 1 TABLET BY MOUTH EVERY NIGHT AT BEDTIME, Disp: 90 tablet, Rfl: 3   divalproex (DEPAKOTE) 250 MG DR tablet, Take 1 tablet (250 mg total) by mouth 2 (two) times daily., Disp: 60 tablet, Rfl: 2   escitalopram (LEXAPRO) 20 MG tablet, Take 1 tablet (20 mg total) by mouth daily., Disp: 30 tablet, Rfl: 2   fluconazole (DIFLUCAN) 150 MG tablet, Take 1 tablet (150 mg total) by mouth daily for 7 days., Disp: 7 tablet, Rfl: 0   fluconazole (DIFLUCAN) 200 MG tablet, Take 1 tablet (200 mg total) by mouth daily for 7 days., Disp: 7 tablet, Rfl: 0   ibuprofen (ADVIL) 200 MG tablet, Take 200 mg by mouth every 6 (six) hours as needed., Disp: , Rfl:    lurasidone (LATUDA) 80 MG TABS tablet, Take 1 tablet (80 mg total) by mouth daily with breakfast., Disp: 90 tablet, Rfl: 0   meloxicam (MOBIC) 15 MG tablet, Take 15 mg by mouth daily., Disp: , Rfl:    mupirocin ointment (BACTROBAN) 2 %, Apply small about inside of both nostrils TWICE a day for the next 5 days., Disp: 15 g, Rfl: 0   nystatin (MYCOSTATIN/NYSTOP) powder, Apply 1 Application topically 3 (three) times daily., Disp: 15 g, Rfl: 0   nystatin (MYCOSTATIN/NYSTOP) powder, Apply 1 Application topically 3 (three) times daily., Disp: 15 g, Rfl: 0   nystatin-triamcinolone ointment (MYCOLOG), Apply topically 2 (two) times daily as needed (to affected intact skin  with yeast rash)., Disp: 30 g, Rfl: 0   oxyCODONE 10 MG TABS, Take 1 tablet (10 mg total) by mouth every 6 (six) hours as needed for severe pain., Disp: 20 tablet, Rfl: 0   OXYGEN, Inhale 2 L into the lungs as needed., Disp: , Rfl:    rOPINIRole (REQUIP XL) 4 MG 24 hr tablet, Take 4 mg by mouth at bedtime., Disp: , Rfl:    senna-docusate (SENOKOT-S) 8.6-50 MG tablet, Take 1 tablet by mouth 2 (two) times daily., Disp: 100 tablet, Rfl: 0   Tiotropium Bromide Monohydrate (SPIRIVA RESPIMAT) 2.5 MCG/ACT AERS, Inhale 2 puffs into the lungs  daily., Disp: 1 each, Rfl: 0   Allergies  Allergen Reactions   Chantix [Varenicline] Nausea Only   Glycopyrrolate Rash    Oral irritation     Social History   Tobacco Use   Smoking status: Former    Packs/day: 0.50    Years: 36.00    Additional pack years: 0.00    Total pack years: 18.00    Types: Cigarettes    Quit date: 03/13/2022    Years since quitting: 0.2    Passive exposure: Past   Smokeless tobacco: Never   Tobacco comments:    Pt using nicotine patches; 1/2 PPD as of 03/21/21  Vaping Use   Vaping Use: Some days   Substances: Nicotine, Flavoring  Substance Use Topics   Alcohol use: No    Alcohol/week: 0.0 standard drinks of alcohol   Drug use: Yes    Types: Marijuana    Comment: occasionally      Chart Review Today: ***  Review of Systems     Objective:   There were no vitals filed for this visit.  There is no height or weight on file to calculate BMI.  Physical Exam   Results for orders placed or performed during the hospital encounter of 06/12/22  CBC with Differential  Result Value Ref Range   WBC 7.2 4.0 - 10.5 K/uL   RBC 3.55 (L) 3.87 - 5.11 MIL/uL   Hemoglobin 10.8 (L) 12.0 - 15.0 g/dL   HCT 16.1 (L) 09.6 - 04.5 %   MCV 92.1 80.0 - 100.0 fL   MCH 30.4 26.0 - 34.0 pg   MCHC 33.0 30.0 - 36.0 g/dL   RDW 40.9 81.1 - 91.4 %   Platelets 238 150 - 400 K/uL   nRBC 0.0 0.0 - 0.2 %   Neutrophils Relative % 54 %   Neutro Abs 3.9 1.7 - 7.7 K/uL   Lymphocytes Relative 31 %   Lymphs Abs 2.2 0.7 - 4.0 K/uL   Monocytes Relative 9 %   Monocytes Absolute 0.7 0.1 - 1.0 K/uL   Eosinophils Relative 5 %   Eosinophils Absolute 0.3 0.0 - 0.5 K/uL   Basophils Relative 1 %   Basophils Absolute 0.1 0.0 - 0.1 K/uL   Immature Granulocytes 0 %   Abs Immature Granulocytes 0.01 0.00 - 0.07 K/uL  Basic metabolic panel  Result Value Ref Range   Sodium 126 (L) 135 - 145 mmol/L   Potassium 4.7 3.5 - 5.1 mmol/L   Chloride 91 (L) 98 - 111 mmol/L   CO2 28 22 - 32  mmol/L   Glucose, Bld 87 70 - 99 mg/dL   BUN 22 8 - 23 mg/dL   Creatinine, Ser 7.82 (H) 0.44 - 1.00 mg/dL   Calcium 8.7 (L) 8.9 - 10.3 mg/dL   GFR, Estimated 56 (L) >60 mL/min   Anion  gap 7 5 - 15  Urinalysis, Routine w reflex microscopic -Urine, Clean Catch  Result Value Ref Range   Color, Urine YELLOW YELLOW   APPearance CLEAR CLEAR   Specific Gravity, Urine 1.023 1.005 - 1.030   pH 6.0 5.0 - 8.0   Glucose, UA NEGATIVE NEGATIVE mg/dL   Hgb urine dipstick NEGATIVE NEGATIVE   Bilirubin Urine NEGATIVE NEGATIVE   Ketones, ur NEGATIVE NEGATIVE mg/dL   Protein, ur NEGATIVE NEGATIVE mg/dL   Nitrite NEGATIVE NEGATIVE   Leukocytes,Ua MODERATE (A) NEGATIVE   RBC / HPF 0-5 0 - 5 RBC/hpf   WBC, UA 6-10 0 - 5 WBC/hpf   Bacteria, UA RARE (A) NONE SEEN   Squamous Epithelial / HPF 0-5 0 - 5 /HPF       Assessment & Plan:   ***     Danelle Berry, PA-C 06/13/22 11:40 AM

## 2022-06-14 ENCOUNTER — Other Ambulatory Visit: Payer: Self-pay

## 2022-06-14 ENCOUNTER — Ambulatory Visit (INDEPENDENT_AMBULATORY_CARE_PROVIDER_SITE_OTHER): Payer: 59 | Admitting: Family Medicine

## 2022-06-14 ENCOUNTER — Emergency Department
Admission: EM | Admit: 2022-06-14 | Discharge: 2022-06-14 | Disposition: A | Discharge status: Home / Self Care | Payer: 59 | Attending: Emergency Medicine | Admitting: Emergency Medicine

## 2022-06-14 ENCOUNTER — Encounter: Payer: Self-pay | Admitting: Family Medicine

## 2022-06-14 VITALS — BP 130/74 | HR 63 | Temp 97.8°F | Resp 16 | Ht 61.0 in | Wt 224.2 lb

## 2022-06-14 DIAGNOSIS — M87059 Idiopathic aseptic necrosis of unspecified femur: Secondary | ICD-10-CM | POA: Diagnosis not present

## 2022-06-14 DIAGNOSIS — R32 Unspecified urinary incontinence: Secondary | ICD-10-CM

## 2022-06-14 DIAGNOSIS — Z09 Encounter for follow-up examination after completed treatment for conditions other than malignant neoplasm: Secondary | ICD-10-CM

## 2022-06-14 DIAGNOSIS — L03314 Cellulitis of groin: Secondary | ICD-10-CM | POA: Diagnosis not present

## 2022-06-14 DIAGNOSIS — R1909 Other intra-abdominal and pelvic swelling, mass and lump: Secondary | ICD-10-CM | POA: Diagnosis not present

## 2022-06-14 DIAGNOSIS — R21 Rash and other nonspecific skin eruption: Secondary | ICD-10-CM | POA: Diagnosis not present

## 2022-06-14 DIAGNOSIS — D649 Anemia, unspecified: Secondary | ICD-10-CM | POA: Diagnosis not present

## 2022-06-14 DIAGNOSIS — B372 Candidiasis of skin and nail: Secondary | ICD-10-CM | POA: Insufficient documentation

## 2022-06-14 DIAGNOSIS — M87052 Idiopathic aseptic necrosis of left femur: Secondary | ICD-10-CM | POA: Diagnosis not present

## 2022-06-14 DIAGNOSIS — L304 Erythema intertrigo: Secondary | ICD-10-CM

## 2022-06-14 DIAGNOSIS — Z8614 Personal history of Methicillin resistant Staphylococcus aureus infection: Secondary | ICD-10-CM

## 2022-06-14 DIAGNOSIS — R269 Unspecified abnormalities of gait and mobility: Secondary | ICD-10-CM

## 2022-06-14 DIAGNOSIS — R600 Localized edema: Secondary | ICD-10-CM | POA: Diagnosis not present

## 2022-06-14 DIAGNOSIS — R7989 Other specified abnormal findings of blood chemistry: Secondary | ICD-10-CM | POA: Diagnosis not present

## 2022-06-14 DIAGNOSIS — M87051 Idiopathic aseptic necrosis of right femur: Secondary | ICD-10-CM | POA: Diagnosis not present

## 2022-06-14 LAB — LACTIC ACID, PLASMA: Lactic Acid, Venous: 1.6 mmol/L (ref 0.5–1.9)

## 2022-06-14 LAB — CBC WITH DIFFERENTIAL/PLATELET
Abs Immature Granulocytes: 0.01 10*3/uL (ref 0.00–0.07)
Basophils Absolute: 0 10*3/uL (ref 0.0–0.1)
Basophils Relative: 0 %
Eosinophils Absolute: 0.1 10*3/uL (ref 0.0–0.5)
Eosinophils Relative: 2 %
HCT: 36.3 % (ref 36.0–46.0)
Hemoglobin: 11.8 g/dL — ABNORMAL LOW (ref 12.0–15.0)
Immature Granulocytes: 0 %
Lymphocytes Relative: 21 %
Lymphs Abs: 1.7 10*3/uL (ref 0.7–4.0)
MCH: 30.6 pg (ref 26.0–34.0)
MCHC: 32.5 g/dL (ref 30.0–36.0)
MCV: 94 fL (ref 80.0–100.0)
Monocytes Absolute: 0.4 10*3/uL (ref 0.1–1.0)
Monocytes Relative: 5 %
Neutro Abs: 5.8 10*3/uL (ref 1.7–7.7)
Neutrophils Relative %: 72 %
Platelets: 235 10*3/uL (ref 150–400)
RBC: 3.86 MIL/uL — ABNORMAL LOW (ref 3.87–5.11)
RDW: 13.9 % (ref 11.5–15.5)
WBC: 8.1 10*3/uL (ref 4.0–10.5)
nRBC: 0 % (ref 0.0–0.2)

## 2022-06-14 LAB — BASIC METABOLIC PANEL
Anion gap: 8 (ref 5–15)
BUN: 19 mg/dL (ref 8–23)
CO2: 26 mmol/L (ref 22–32)
Calcium: 8.8 mg/dL — ABNORMAL LOW (ref 8.9–10.3)
Chloride: 92 mmol/L — ABNORMAL LOW (ref 98–111)
Creatinine, Ser: 1.11 mg/dL — ABNORMAL HIGH (ref 0.44–1.00)
GFR, Estimated: 56 mL/min — ABNORMAL LOW (ref >60)
Glucose, Bld: 115 mg/dL — ABNORMAL HIGH (ref 70–99)
Potassium: 4.7 mmol/L (ref 3.5–5.1)
Sodium: 126 mmol/L — ABNORMAL LOW (ref 135–145)

## 2022-06-14 MED ORDER — NYSTATIN 100000 UNIT/GM EX POWD: 1.0000 | Freq: Two times a day (BID) | CUTANEOUS | 1 refills | Status: DC

## 2022-06-14 MED ORDER — INTERDRY 10"X144" EX SHEE
MEDICATED_PATCH | CUTANEOUS | 1 refills | Status: DC
Start: 2022-06-14 — End: 2022-07-10

## 2022-06-14 MED ORDER — ZINC OXIDE 20 % EX OINT
1.0000 | TOPICAL_OINTMENT | Freq: Once | CUTANEOUS | Status: AC
  Administered 2022-06-14: 1 via TOPICAL
  Filled 2022-06-14: qty 1

## 2022-06-14 MED ORDER — MICONAZOLE NITRATE 2 % EX POWD
Freq: Three times a day (TID) | CUTANEOUS | 2 refills | Status: DC | PRN
Start: 2022-06-14 — End: 2022-06-20

## 2022-06-14 MED ORDER — DOXYCYCLINE HYCLATE 100 MG PO TABS: 100.0000 mg | ORAL_TABLET | Freq: Two times a day (BID) | ORAL | 0 refills | Status: DC

## 2022-06-14 MED ORDER — NYSTATIN 100000 UNIT/GM EX POWD
1.0000 | Freq: Two times a day (BID) | CUTANEOUS | Status: DC
  Administered 2022-06-14: 1 via TOPICAL
  Filled 2022-06-14: qty 15

## 2022-06-14 MED ORDER — DOXYCYCLINE HYCLATE 100 MG PO TABS
100.0000 mg | ORAL_TABLET | Freq: Once | ORAL | Status: AC
  Administered 2022-06-14: 100 mg via ORAL
  Filled 2022-06-14: qty 1

## 2022-06-14 MED ORDER — HYDROCODONE-ACETAMINOPHEN 5-325 MG PO TABS: 1.0000 | ORAL_TABLET | Freq: Three times a day (TID) | ORAL | 0 refills | Status: DC | PRN

## 2022-06-14 MED ORDER — HYDROCODONE-ACETAMINOPHEN 5-325 MG PO TABS
1.0000 | ORAL_TABLET | Freq: Once | ORAL | Status: AC
  Administered 2022-06-14: 1 via ORAL
  Filled 2022-06-14: qty 1

## 2022-06-14 MED ORDER — HYDROCODONE-ACETAMINOPHEN 5-325 MG PO TABS: 1.0000 | ORAL_TABLET | Freq: Three times a day (TID) | ORAL | 0 refills | Status: AC | PRN

## 2022-06-14 NOTE — Patient Instructions (Signed)
[employer] InterD[employer] Ag [employer]ile with Antimicrobial Silver

## 2022-06-14 NOTE — Discharge Instructions (Addendum)
Your labs are reassuring without signs of sepsis.  Your yeast dermatitis needs to be cared for with hygiene measures and daily wound care.  You are being discharged with doxycycline to treat any underlying MRSA cellulitis.  You are also being prescribed hydrocodone for pain, and nystatin powder to continue to use with daily wound dressing.  Home health care nurses from [facility] have been set up to issue with ADLs, PT, daily wound care.  They will be available starting tomorrow to help you with wound care.  Take antibiotic as directed the pain medicine as needed, and keep the area of your pannus and perineum is clean and dry as possible.  You may use a cool hand dryer as needed to cleanse the area.  You should keep barrier sheets or ABD pads between your skin folds to help reduce skin to skin contact.

## 2022-06-14 NOTE — Progress Notes (Signed)
Patient ID: Kamira Mellette, female    DOB: 08/20/1960, 62 y.o.   MRN: [id number]  PCP: Danelle Berry, PA-C  Chief Complaint  Patient presents with   Hospitalization Follow-up    Rash on the groin area not any better pt states feels rash spreading to left side now and hurts    Subjective:   Zaylin Pistilli is a 62 y.o. female, presents to clinic with CC of the following:  HPI  Rash severe burning not improved with 2 ER visits, diflucan daily dose which was increased yesterday and topical nystatin - she cannot walk due to pain, she is unable to see where she is putting the meds, she has increased drainage, odor and pain. She was told to stop topical nystatin-steroid  Failed outpt tx over the past week  After history and exam today she asks if she can just be admitted to the hospital to help get infection to improve She asks for EMS transport to ER   Patient Active Problem List   Diagnosis Date Noted   History of methicillin resistant staphylococcus aureus (MRSA) 05/03/2022   Avascular necrosis of bones of both hips 05/03/2022   Chronic prescription benzodiazepine use 05/03/2022   Polypharmacy 04/09/2022   Sinus bradycardia 04/09/2022   Depression with anxiety 04/09/2022   AVN of femur 03/21/2022   MRSA (methicillin resistant staph aureus) culture positive 05/04/2021   Prolonged QT interval 01/01/2021   Benign neoplasm of ascending colon    Polyp of sigmoid colon    Bipolar disorder with depression 02/25/2017   ADD (attention deficit disorder) 02/25/2017   Marijuana use 02/13/2017   Neuropathy, peripheral 05/07/2016   Vitamin B12 deficiency 10/24/2015   Fibromyalgia 08/30/2015   GERD (gastroesophageal reflux disease) 08/30/2015   OP (osteoporosis) 06/30/2015   Sleep apnea 06/30/2015   HLD (hyperlipidemia) 05/13/2015   Morbid (severe) obesity due to excess calories 08/16/2014   Low back pain with sciatica 08/04/2014   Polysubstance abuse 07/04/2014   Anxiety,  generalized 11/24/2013   COPD (chronic obstructive pulmonary disease) 11/18/2013   Arthritis of knee, degenerative 07/15/2013   Osteoarthritis of knee, unspecified 07/15/2013      Current Outpatient Medications:    acetaminophen (TYLENOL) 500 MG tablet, Take 2 tablets (1,000 mg total) by mouth every 8 (eight) hours as needed., Disp: 90 tablet, Rfl: 0   albuterol (VENTOLIN HFA) 108 (90 Base) MCG/ACT inhaler, Inhale 2 puffs into the lungs every 4 (four) hours as needed for wheezing or shortness of breath., Disp: 18 g, Rfl: 2   ALPRAZolam (XANAX) 0.5 MG tablet, Take 1 tablet (0.5 mg total) by mouth 2 (two) times daily., Disp: , Rfl: 0   atorvastatin (LIPITOR) 40 MG tablet, TAKE 1 TABLET BY MOUTH EVERY NIGHT AT BEDTIME, Disp: 90 tablet, Rfl: 3   divalproex (DEPAKOTE) 250 MG DR tablet, Take 1 tablet (250 mg total) by mouth 2 (two) times daily., Disp: 60 tablet, Rfl: 2   fluconazole (DIFLUCAN) 150 MG tablet, Take 1 tablet (150 mg total) by mouth daily for 7 days., Disp: 7 tablet, Rfl: 0   fluconazole (DIFLUCAN) 200 MG tablet, Take 1 tablet (200 mg total) by mouth daily for 7 days., Disp: 7 tablet, Rfl: 0   ibuprofen (ADVIL) 200 MG tablet, Take 200 mg by mouth every 6 (six) hours as needed., Disp: , Rfl:    lurasidone (LATUDA) 80 MG TABS tablet, Take 1 tablet (80 mg total) by mouth daily with breakfast., Disp: 90 tablet, Rfl: 0  meloxicam (MOBIC) 15 MG tablet, Take 15 mg by mouth daily., Disp: , Rfl:    miconazole (MICOTIN) 2 % powder, Apply topically 3 (three) times daily as needed for itching., Disp: 70 g, Rfl: 2   mupirocin ointment (BACTROBAN) 2 %, Apply small about inside of both nostrils TWICE a day for the next 5 days., Disp: 15 g, Rfl: 0   nystatin (MYCOSTATIN/NYSTOP) powder, Apply 1 Application topically 3 (three) times daily., Disp: 15 g, Rfl: 0   nystatin (MYCOSTATIN/NYSTOP) powder, Apply 1 Application topically 3 (three) times daily., Disp: 15 g, Rfl: 0   nystatin-triamcinolone ointment  (MYCOLOG), Apply topically 2 (two) times daily as needed (to affected intact skin with yeast rash)., Disp: 30 g, Rfl: 0   oxyCODONE 10 MG TABS, Take 1 tablet (10 mg total) by mouth every 6 (six) hours as needed for severe pain., Disp: 20 tablet, Rfl: 0   OXYGEN, Inhale 2 L into the lungs as needed., Disp: , Rfl:    rOPINIRole (REQUIP XL) 4 MG 24 hr tablet, Take 4 mg by mouth at bedtime., Disp: , Rfl:    senna-docusate (SENOKOT-S) 8.6-50 MG tablet, Take 1 tablet by mouth 2 (two) times daily., Disp: 100 tablet, Rfl: 0   Tiotropium Bromide Monohydrate (SPIRIVA RESPIMAT) 2.5 MCG/ACT AERS, Inhale 2 puffs into the lungs daily., Disp: 1 each, Rfl: 0   escitalopram (LEXAPRO) 20 MG tablet, Take 1 tablet (20 mg total) by mouth daily., Disp: 30 tablet, Rfl: 2   Allergies  Allergen Reactions   Chantix [Varenicline] Nausea Only   Glycopyrrolate Rash    Oral irritation     Social History   Tobacco Use   Smoking status: Former    Packs/day: 0.50    Years: 36.00    Additional pack years: 0.00    Total pack years: 18.00    Types: Cigarettes    Quit date: 03/13/2022    Years since quitting: 0.2    Passive exposure: Past   Smokeless tobacco: Never   Tobacco comments:    Pt using nicotine patches; 1/2 PPD as of 03/21/21  Vaping Use   Vaping Use: Some days   Substances: Nicotine, Flavoring  Substance Use Topics   Alcohol use: No    Alcohol/week: 0.0 standard drinks of alcohol   Drug use: Yes    Types: Marijuana    Comment: occasionally      Chart Review Today: I personally reviewed active problem list, medication list, allergies, family history, social history, health maintenance, notes from last encounter, lab results, imaging with the patient/caregiver today.   Review of Systems  Constitutional: Negative.   HENT: Negative.    Eyes: Negative.   Respiratory: Negative.    Cardiovascular: Negative.   Gastrointestinal: Negative.   Endocrine: Negative.   Genitourinary: Negative.    Musculoskeletal: Negative.   Skin: Negative.   Allergic/Immunologic: Negative.   Neurological: Negative.   Hematological: Negative.   Psychiatric/Behavioral: Negative.    All other systems reviewed and are negative.      Objective:   Vitals:   06/14/22 1034  BP: 130/74  Pulse: 63  Resp: 16  Temp: 97.8 F (36.6 C)  TempSrc: Oral  SpO2: 96%  Weight: 224 lb 3.2 oz (101.7 kg)  Height: 5\' 1"  (1.549 m)    Body mass index is 42.36 kg/m.  Physical Exam Vitals and nursing note reviewed.  Constitutional:      General: She is not in acute distress.    Appearance: She is well-developed. She  is obese. She is ill-appearing (mildly ill appearing). She is not toxic-appearing or diaphoretic.  HENT:     Head: Normocephalic and atraumatic.     Nose: Nose normal.  Eyes:     General:        Right eye: No discharge.        Left eye: No discharge.     Conjunctiva/sclera: Conjunctivae normal.  Neck:     Trachea: No tracheal deviation.  Cardiovascular:     Rate and Rhythm: Normal rate and regular rhythm.  Pulmonary:     Effort: Pulmonary effort is normal. No respiratory distress.     Breath sounds: No stridor.  Musculoskeletal:        General: Normal range of motion.  Skin:    General: Skin is warm and dry.     Coloration: Skin is pale.     Findings: Lesion, rash and wound present.     Comments: See pictures  Neurological:     Mental Status: She is alert.     Motor: No abnormal muscle tone.     Comments: Pt alert, examined in WC only  Psychiatric:        Mood and Affect: Affect is tearful.        Behavior: Behavior normal.           Results for orders placed or performed during the hospital encounter of 06/12/22  CBC with Differential  Result Value Ref Range   WBC 7.2 4.0 - 10.5 K/uL   RBC 3.55 (L) 3.87 - 5.11 MIL/uL   Hemoglobin 10.8 (L) 12.0 - 15.0 g/dL   HCT 29.5 (L) 62.1 - 30.8 %   MCV 92.1 80.0 - 100.0 fL   MCH 30.4 26.0 - 34.0 pg   MCHC 33.0 30.0 - 36.0  g/dL   RDW 65.7 84.6 - 96.2 %   Platelets 238 150 - 400 K/uL   nRBC 0.0 0.0 - 0.2 %   Neutrophils Relative % 54 %   Neutro Abs 3.9 1.7 - 7.7 K/uL   Lymphocytes Relative 31 %   Lymphs Abs 2.2 0.7 - 4.0 K/uL   Monocytes Relative 9 %   Monocytes Absolute 0.7 0.1 - 1.0 K/uL   Eosinophils Relative 5 %   Eosinophils Absolute 0.3 0.0 - 0.5 K/uL   Basophils Relative 1 %   Basophils Absolute 0.1 0.0 - 0.1 K/uL   Immature Granulocytes 0 %   Abs Immature Granulocytes 0.01 0.00 - 0.07 K/uL  Basic metabolic panel  Result Value Ref Range   Sodium 126 (L) 135 - 145 mmol/L   Potassium 4.7 3.5 - 5.1 mmol/L   Chloride 91 (L) 98 - 111 mmol/L   CO2 28 22 - 32 mmol/L   Glucose, Bld 87 70 - 99 mg/dL   BUN 22 8 - 23 mg/dL   Creatinine, Ser 9.52 (H) 0.44 - 1.00 mg/dL   Calcium 8.7 (L) 8.9 - 10.3 mg/dL   GFR, Estimated 56 (L) >60 mL/min   Anion gap 7 5 - 15  Urinalysis, Routine w reflex microscopic -Urine, Clean Catch  Result Value Ref Range   Color, Urine YELLOW YELLOW   APPearance CLEAR CLEAR   Specific Gravity, Urine 1.023 1.005 - 1.030   pH 6.0 5.0 - 8.0   Glucose, UA NEGATIVE NEGATIVE mg/dL   Hgb urine dipstick NEGATIVE NEGATIVE   Bilirubin Urine NEGATIVE NEGATIVE   Ketones, ur NEGATIVE NEGATIVE mg/dL   Protein, ur NEGATIVE NEGATIVE mg/dL   Nitrite NEGATIVE  NEGATIVE   Leukocytes,Ua MODERATE (A) NEGATIVE   RBC / HPF 0-5 0 - 5 RBC/hpf   WBC, UA 6-10 0 - 5 WBC/hpf   Bacteria, UA RARE (A) NONE SEEN   Squamous Epithelial / HPF 0-5 0 - 5 /HPF       Assessment & Plan:   1. Intertrigo No improvement with diflucan x 5 d 150 increased 2 d ago to 200 mg daily, topical meds, 2 er visits - miconazole (MICOTIN) 2 % powder; Apply topically 3 (three) times daily as needed for itching.  Dispense: 70 g; Refill: 2 - Skin Protectants, Misc. (INTERDRY 96"E454") SHEE; Cut piece of fabric to fit area of skin affected and apply to skin for for up to 5 days, replace sooner if soiled  Dispense: 2 each;  Refill: 1 - WOUND CULTURE  2. Cellulitis of right groin Worsening skin infection to panus right skin folds, spreading to left - Skin Protectants, Misc. (INTERDRY 09"W119") SHEE; Cut piece of fabric to fit area of skin affected and apply to skin for for up to 5 days, replace sooner if soiled  Dispense: 2 each; Refill: 1 - WOUND CULTURE  3. Anemia, unspecified type H/H trending down, pt mildly pale appearing,   4. Encounter for examination following treatment at hospital ER x 2 no improvement, failed outpt tx  5. History of methicillin resistant staphylococcus aureus (MRSA) Culture obtained with worsening skin wound/infection purulence Previously needed IV abx and ID consult  6. Avascular necrosis of bones of both hips Stable - still pending surgery  7. Urinary incontinence, unspecified type Unable to get up to urinate, incontinence making wound/infection care worse  8. Unspecified abnormalities of gait and mobility Worsening mobility, cannot walk currently with walker due to worsening infection/pain  PT requested to go to the ED for reevaluation EMS called and currently getting her in gurney to go to [facility], PA-C 06/14/22 11:19 AM

## 2022-06-14 NOTE — ED Provider Notes (Signed)
[facility] Emergency Department Provider Note     Event Date/Time   First MD Initiated Contact with Patient 06/14/22 1317     (approximate)   History   Rash   HPI  Nicole Austin is a 62 y.o. female presents to the ED via EMS from her PCPs office.  Patient has been under the care of her PCP for severe intertriginous L secondary to yeast.  Patient has had 2 subsequent ED visits for the same complaint.  She is currently on a course of 200 mg of p.o. fluconazole as well as nystatin powder with daily dressing changes.  Patient presents with concern for worsening intertrigo secondary to her body habitus, and poor wound care capabilities.  Patient denies any fevers, chills, or sweats.  She does endorse pain and burning to the perineum, pannus and intertriginous skin of the lower abdomen.  Patient reports difficulty keeping the area clean, and dry.   Physical Exam   Triage Vital Signs: ED Triage Vitals  Enc Vitals Group     BP 06/14/22 1235 (!) 152/67     Pulse Rate 06/14/22 1235 63     Resp 06/14/22 1235 18     Temp 06/14/22 1235 97.9 F (36.6 C)     Temp src --      SpO2 06/14/22 1235 96 %     Weight --      Height --      Head Circumference --      Peak Flow --      Pain Score 06/14/22 1230 0     Pain Loc --      Pain Edu? --      Excl. in GC? --     Most recent vital signs: Vitals:   06/14/22 1235 06/14/22 1800  BP: (!) 152/67   Pulse: 63 64  Resp: 18   Temp: 97.9 F (36.6 C)   SpO2: 96% 98%    General Awake, no distress. NAD CV:  Good peripheral perfusion.  RESP:  Normal effort.  ABD:  No distention.  Protuberant abdomen with severe erythema, weeping, and diminutive skin consistent with yeast dermatitis to the pannus and in the intertriginous area of the lower abdomen.  Skin changes extend into the perineum and vulva.  ED Results / Procedures / Treatments   Labs (all labs ordered are listed, but only abnormal results are  displayed) Labs Reviewed  CBC WITH DIFFERENTIAL/PLATELET - Abnormal; Notable for the following components:      Result Value   RBC 3.86 (*)    Hemoglobin 11.8 (*)    All other components within normal limits  BASIC METABOLIC PANEL - Abnormal; Notable for the following components:   Sodium 126 (*)    Chloride 92 (*)    Glucose, Bld 115 (*)    Creatinine, Ser 1.11 (*)    Calcium 8.8 (*)    GFR, Estimated 56 (*)    All other components within normal limits  LACTIC ACID, PLASMA  URINALYSIS, ROUTINE W REFLEX MICROSCOPIC     EKG   RADIOLOGY  No results found.   PROCEDURES:  Critical Care performed: No  Procedures   MEDICATIONS ORDERED IN ED: Medications  nystatin (MYCOSTATIN/NYSTOP) topical powder 1 Application (1 Application Topical Given 06/14/22 1710)  zinc oxide 20 % ointment 1 Application (1 Application Topical Given 06/14/22 1711)  HYDROcodone-acetaminophen (NORCO/VICODIN) 5-325 MG per tablet 1 tablet (1 tablet Oral Given 06/14/22 1706)  doxycycline (VIBRA-TABS) tablet 100  mg (100 mg Oral Given 06/14/22 1710)     IMPRESSION / MDM / ASSESSMENT AND PLAN / ED COURSE  I reviewed the triage vital signs and the nursing notes.                              Differential diagnosis includes, but is not limited to, cellulitis, contact dermatitis, yeast dermatitis, Fournier's gangrene  Patient's presentation is most consistent with acute complicated illness / injury requiring diagnostic workup.  Patient to the ED for evaluation of persistent yeast dermatitis and intertrigo to the perineum and pannus.  Patient presents to the ED with concern for worsening of continued infection.  Patient with reassuring exam workup overall.  Patient with normal vital signs on presentation she is afebrile without tachycardia or other abnormal vital signs.  Labs are reassuring as is shows a normal white blood cell count and a negative lactic acid.  No electrolyte abnormalities appreciated.   Clinically the patient has significant intertrigo secondary to her body habitus.  The case is complicated by her difficulty in managing her daily wound care given some chronic right hip DJD.  No indication at this time for septic or toxic presentation.  I think it is wise however, The patient with Doxy for possible secondary infection with MRSA.  Chart review reveals that the patient has wound cultures pending at this time.  She also has been treated appropriately with oral fluconazole and topical antifungal powder.  We discussed with the patient the importance of keeping the skin clean, dry, and out of contact with itself.  Patient has been given care instructions and supplies including ABD pads, mesh underwear, and wound dressing in the ED.  I reached out to the social worker on-call, and arranged for immediate home health care for both wound care management and PT with [facility] agency.  The agency is familiar with the patient as they took care of her earlier in the year.  I discussed the case management with the patient along with her sister via telephone, who verbalized understanding that the patient's condition while concerning did not present at this time is an indication for admission.  Patient needs daily wound care which at this point has been arranged.  Patient is agreeable to the treatment plan and is comfortable discharging home with supplies, and expectant management with home health nurse tomorrow.  Patient is at this time patient's diagnosis is consistent with intertrigo secondary to yeast candidiasis. Patient will be discharged home with prescriptions for nystatin powder, hydrocodone, doxycycline for MRSA coverage. Patient is to follow up with primary provider as needed or otherwise directed. Patient is given ED precautions to return to the ED for any worsening or new symptoms.  FINAL CLINICAL IMPRESSION(S) / ED DIAGNOSES   Final diagnoses:  Intertriginous candidiasis      Rx / DC Orders   ED Discharge Orders          Ordered    doxycycline (VIBRA-TABS) 100 MG tablet  2 times daily,   Status:  Discontinued        06/14/22 1710    HYDROcodone-acetaminophen (NORCO) 5-325 MG tablet  3 times daily PRN,   Status:  Discontinued        06/14/22 1710    nystatin (MYCOSTATIN/NYSTOP) powder  2 times daily,   Status:  Discontinued        06/14/22 1710    nystatin (MYCOSTATIN/NYSTOP) powder  2  times daily        06/14/22 1722    HYDROcodone-acetaminophen (NORCO) 5-325 MG tablet  3 times daily PRN        06/14/22 1722    doxycycline (VIBRA-TABS) 100 MG tablet  2 times daily        06/14/22 1722             Note:  This document was prepared using Dragon voice recognition software and may include unintentional dictation errors.    Lissa Hoard, PA-C 06/14/22 1946    Pilar Jarvis, MD 06/15/22 [phone]

## 2022-06-14 NOTE — ED Triage Notes (Signed)
Pt comes via EMS from doctor office with c/o rash under breast. Pt was seen here on 16th for same.

## 2022-06-14 NOTE — TOC Initial Note (Signed)
Transition of Care [facility]) - Initial/Assessment Note    Patient Details  Name: Nicole Austin MRN: [id number] Date of Birth: Dec 08, 1960  Transition of [facility] CM/SW Contact:    Darolyn Rua, LCSW Phone Number: 06/14/2022, 3:55 PM  Clinical Narrative:                  CSW spoke with patient regarding home health recommendations for RN for wound care and PT, agreeable to [facility] who she had back in February of 2024.   Patient reports no additional needs. Per PA, patient needs to keep the area clean, dry, and covered with RX antifungal powder and avoid skin-skin contact.   Barbara Cower with [facility] provided referral and accepted.   Expected Discharge Plan: Home w Home Health Services Barriers to Discharge: Continued Medical Work up   Patient Goals and CMS Choice Patient states their goals for this hospitalization and ongoing recovery are:: to go home CMS Medicare.gov Compare Post Acute Care list provided to:: Patient Choice offered to / list presented to : Patient      Expected Discharge Plan and Services       Living arrangements for the past 2 months: Single Family Home                                      Prior Living Arrangements/Services Living arrangements for the past 2 months: Single Family Home                     Activities of Daily Living      Permission Sought/Granted                  Emotional Assessment              Admission diagnosis:  Skin Infection, EMS Patient Active Problem List   Diagnosis Date Noted   History of methicillin resistant staphylococcus aureus (MRSA) 05/03/2022   Avascular necrosis of bones of both hips 05/03/2022   Chronic prescription benzodiazepine use 05/03/2022   Polypharmacy 04/09/2022   Sinus bradycardia 04/09/2022   Depression with anxiety 04/09/2022   AVN of femur 03/21/2022   MRSA (methicillin resistant staph aureus) culture positive 05/04/2021   Prolonged QT interval 01/01/2021    Benign neoplasm of ascending colon    Polyp of sigmoid colon    Bipolar disorder with depression 02/25/2017   ADD (attention deficit disorder) 02/25/2017   Marijuana use 02/13/2017   Neuropathy, peripheral 05/07/2016   Vitamin B12 deficiency 10/24/2015   Fibromyalgia 08/30/2015   GERD (gastroesophageal reflux disease) 08/30/2015   OP (osteoporosis) 06/30/2015   Sleep apnea 06/30/2015   HLD (hyperlipidemia) 05/13/2015   Morbid (severe) obesity due to excess calories 08/16/2014   Low back pain with sciatica 08/04/2014   Polysubstance abuse 07/04/2014   Anxiety, generalized 11/24/2013   COPD (chronic obstructive pulmonary disease) 11/18/2013   Arthritis of knee, degenerative 07/15/2013   Osteoarthritis of knee, unspecified 07/15/2013   PCP:  Danelle Berry, PA-C Pharmacy:   [facility] - [place],  - [address] [place] [address] Phone: [phone] Fax: [phone]  [facility] - [place], [place] - [address] [place] [address] Phone: [phone] Fax: [phone]  [facility] [address], [place] - [address] [place] [address] Phone: [phone] Fax: [phone]  [id number] - [place],  [place] - [address] AT [facility] [address] [facility] [address] [place] [address] Phone: [phone] Fax: [phone]  [id number] - [place], [place] - [address] AT [facility] OF [address] [place] [address] Phone: [phone] Fax: [phone]     Social Determinants of Health (SDOH) Social History: SDOH Screenings   Food Insecurity: No Food Insecurity (04/09/2022)  Housing: Low Risk  (04/09/2022)  Transportation Needs: No Transportation Needs (04/09/2022)  Utilities: Not At Risk (04/09/2022)  Alcohol Screen: Low Risk  (03/21/2022)  Depression (PHQ2-9): High Risk (06/14/2022)   Financial Resource Strain: Low Risk  (04/25/2020)  Recent Concern: Financial Resource Strain - Medium Risk (03/17/2020)  Physical Activity: Inactive (03/21/2021)  Social Connections: Socially Isolated (03/21/2021)  Stress: Stress Concern Present (03/21/2021)  Tobacco Use: Medium Risk (06/14/2022)   SDOH Interventions:     Readmission Risk Interventions     No data to display

## 2022-06-15 ENCOUNTER — Telehealth: Payer: Self-pay

## 2022-06-15 DIAGNOSIS — B372 Candidiasis of skin and nail: Secondary | ICD-10-CM | POA: Diagnosis not present

## 2022-06-15 LAB — WOUND CULTURE

## 2022-06-15 NOTE — Telephone Encounter (Signed)
     Patient  visit on 06/12/2022  at [facility]  was for rash.   Have you been able to follow up with your primary care physician? Yes  The patient was or was not able to obtain any needed medicine or equipment. Patient was able to obtain medication.  Are there diet recommendations that you are having difficulty following? No  Patient expresses understanding of discharge instructions and education provided has no other needs at this time. Yes   Kaylla Cobos [facility]  [facility] Population Health Community Resource Care Guide   [email or web address]  ?? [phone]   Website: [email or web address]  [email or web address]

## 2022-06-16 LAB — WOUND CULTURE

## 2022-06-17 LAB — WOUND CULTURE
MICRO NUMBER:: 14844878
SPECIMEN QUALITY:: ADEQUATE

## 2022-06-18 ENCOUNTER — Telehealth: Payer: Self-pay | Admitting: Family Medicine

## 2022-06-18 DIAGNOSIS — B372 Candidiasis of skin and nail: Secondary | ICD-10-CM | POA: Diagnosis not present

## 2022-06-18 NOTE — Telephone Encounter (Signed)
Pt is wanting to see if PCP can have [employer] deliver the walker to her home.

## 2022-06-18 NOTE — Telephone Encounter (Signed)
Pt is requesting for PCP to send over an order to [employer] for a two wheel walker for her. Please advise.

## 2022-06-18 NOTE — Telephone Encounter (Signed)
Unable to see any message or number to call back on this encounter

## 2022-06-18 NOTE — Telephone Encounter (Signed)
Will you write the walker order?

## 2022-06-18 NOTE — Telephone Encounter (Signed)
I called [facility] office spoke to [facility] nurse. She states patient is no longer a patient there she was dismissed from there office on 06/13/2022 and her surgery got canceled. They did not put any order in for a walker FYI.

## 2022-06-19 ENCOUNTER — Ambulatory Visit: Payer: 59 | Admitting: Family Medicine

## 2022-06-19 ENCOUNTER — Telehealth: Payer: Self-pay

## 2022-06-19 ENCOUNTER — Ambulatory Visit: Payer: Self-pay

## 2022-06-19 NOTE — Telephone Encounter (Signed)
Telephone encounter was:  Successful.  06/19/2022 Name: Nicole Austin MRN: [id number] DOB: Sep 09, 1960  Nicole Austin is a 62 y.o. year old female who is a primary care patient of Nicole Austin, [place] . The community resource team was consulted for assistance with Transportation Needs   Care guide performed the following interventions: Patient provided with information about care guide support team and interviewed to confirm resource needs.  Follow Up Plan:  No further follow up planned at this time. The patient has been provided with needed resources.   Nicole [name] Nicole Austin Guide, Nicole Austin [phone] Nicole [name] Austin Phone: [phone] Email: [email or web address]     Nicole Austin DOB: Jul 01, 1960 MRN: [id number]   Nicole Austin  For purposes of improving physical access to our facilities, Nicole Austin is pleased to partner with third parties to provide Nicole Austin patients or other authorized individuals the option of convenient, on-demand ground transportation services (the [employer]") through use of the technology service that enables users to request on-demand ground transportation from independent third-party providers.  By opting to use and accept these [employer], I, the undersigned, hereby agree on behalf of myself, and on behalf of any minor child using the Science writer for whom I am the parent or legal guardian, as follows:  Science writer provided to me are provided by independent third-party transportation providers who are not [employer] or employees and who are unaffiliated with [employer]. [facility] is neither a transportation carrier nor a common or public carrier. [facility] has no control over the quality or safety of the transportation that occurs as a result of the [employer]. [facility] cannot guarantee that any third-party  transportation provider will complete any arranged transportation service. [facility] makes no representation, warranty, or guarantee regarding the reliability, timeliness, quality, safety, suitability, or availability of any of the Transport Services or that they will be error free. I fully understand that traveling by vehicle involves risks and dangers of serious bodily injury, including permanent disability, paralysis, and death. I agree, on behalf of myself and on behalf of any minor child using the Transport Services for whom I am the parent or legal guardian, that the entire risk arising out of my use of the [employer] remains solely with me, to the maximum extent permitted under applicable law. The [employer] are provided "as is" and "as available." [facility] disclaims all representations and warranties, express, implied or statutory, not expressly set out in these terms, including the implied warranties of merchantability and fitness for a particular purpose. I hereby waive and release [place] [facility], its agents, employees, officers, directors, representatives, insurers, attorneys, assigns, successors, subsidiaries, and affiliates from any and all past, present, or future claims, demands, liabilities, actions, causes of action, or suits of any kind directly or indirectly arising from acceptance and use of the [employer]. I further waive and release [facility] and its affiliates from all present and future Austin and responsibility for any injury or death to persons or damages to property caused by or related to the use of the [employer]. I have read this Waiver and Release of [place], and I understand the terms used in it and their legal significance. This Waiver is freely and voluntarily given with the understanding that my right (as well as the right of any minor child for whom I am the parent or legal guardian using the Transport  Services) to legal recourse  against [facility] in connection with the Transport Services is knowingly surrendered in return for use of these services.   I attest that I read the consent document to Nicole Austin, gave Nicole Austin the opportunity to ask questions and answered the questions asked (if any). I affirm that Nicole Austin then provided consent for she's participation in this program.     Nicole Austin

## 2022-06-19 NOTE — Telephone Encounter (Signed)
  Chief Complaint: No help with wound care - infection Symptoms: infection Frequency: Ongoing Pertinent Negatives: Patient denies  Disposition: ED /[] Urgent Care (no appt availability in office) / Appointment(In office/virtual)/  [facility]/ Home Care/ Refused Recommended Disposition /[] [facility]/  Follow-up with PCP Additional Notes: Pt needs help with wound care. Home health did not come yesterday and today they sent PT instead of nursing. Pt is unable to care for wound.  Pt needs help getting home health out and wound needs care today.    Reason for Disposition  [1] Skin around the wound has become red AND [2] larger than 2 inches (5 cm)  Answer Assessment - Initial Assessment Questions 1. LOCATION: "Where is the wound located?"      Vaginal area 2. WOUND APPEARANCE: "What does the wound look like?"      Unsure 3. SIZE: If redness is present, ask: "What is the size of the red area?" (Inches, centimeters, or compare to size of a coin)       4. SPREAD: "What's changed in the last day?"  "Do you see any red streaks coming from the wound?"      5. ONSET: "When did it start to look infected?"       6. MECHANISM: "How did the wound start, what was the cause?"      7. PAIN: "Is there any pain?" If Yes, ask: "How bad is the pain?"   (Scale 1-10; or mild, moderate, severe)      8. FEVER: "Do you have a fever?" If Yes, ask: "What is your temperature, how was it measured, and when did it start?"      9. OTHER SYMPTOMS: "Do you have any other symptoms?" (e.g., shaking chills, weakness, rash elsewhere on body)      10. PREGNANCY: "Is there any chance you are pregnant?" "When was your last menstrual period?"  Protocols used: Wound Infection-A-AH

## 2022-06-19 NOTE — Telephone Encounter (Signed)
     Patient  visit on 4/18  at [facility]   Have you been able to follow up with your primary care physician? Yes   The patient was or was not able to obtain any needed medicine or equipment. Yes   Are there diet recommendations that you are having difficulty following? Na   Patient expresses understanding of discharge instructions and education provided has no other needs at this time.  Yes      Nithin Demeo [facility] Care Guide, [place] [phone] [address], [place], [place] [address] Phone: [phone] Email: [email or web address]    

## 2022-06-20 ENCOUNTER — Ambulatory Visit (INDEPENDENT_AMBULATORY_CARE_PROVIDER_SITE_OTHER): Payer: 59 | Admitting: Internal Medicine

## 2022-06-20 ENCOUNTER — Emergency Department (HOSPITAL_COMMUNITY): Payer: 59

## 2022-06-20 ENCOUNTER — Emergency Department (HOSPITAL_BASED_OUTPATIENT_CLINIC_OR_DEPARTMENT_OTHER): Payer: 59

## 2022-06-20 ENCOUNTER — Encounter: Payer: Self-pay | Admitting: Internal Medicine

## 2022-06-20 ENCOUNTER — Other Ambulatory Visit: Payer: Self-pay

## 2022-06-20 ENCOUNTER — Encounter (HOSPITAL_COMMUNITY): Payer: Self-pay

## 2022-06-20 ENCOUNTER — Emergency Department (HOSPITAL_COMMUNITY)
Admission: EM | Admit: 2022-06-20 | Discharge: 2022-06-20 | Disposition: A | Discharge status: Home / Self Care | Payer: 59 | Attending: Emergency Medicine | Admitting: Emergency Medicine

## 2022-06-20 ENCOUNTER — Telehealth: Payer: Self-pay | Admitting: Family Medicine

## 2022-06-20 VITALS — HR 88 | Temp 97.9°F | Resp 16 | Ht 61.0 in

## 2022-06-20 DIAGNOSIS — L304 Erythema intertrigo: Secondary | ICD-10-CM | POA: Diagnosis not present

## 2022-06-20 DIAGNOSIS — M79605 Pain in left leg: Secondary | ICD-10-CM | POA: Insufficient documentation

## 2022-06-20 DIAGNOSIS — M25552 Pain in left hip: Secondary | ICD-10-CM | POA: Diagnosis not present

## 2022-06-20 DIAGNOSIS — J449 Chronic obstructive pulmonary disease, unspecified: Secondary | ICD-10-CM | POA: Diagnosis not present

## 2022-06-20 DIAGNOSIS — Z743 Need for continuous supervision: Secondary | ICD-10-CM | POA: Diagnosis not present

## 2022-06-20 DIAGNOSIS — M545 Low back pain, unspecified: Secondary | ICD-10-CM | POA: Diagnosis not present

## 2022-06-20 DIAGNOSIS — R6889 Other general symptoms and signs: Secondary | ICD-10-CM | POA: Diagnosis not present

## 2022-06-20 DIAGNOSIS — G8929 Other chronic pain: Secondary | ICD-10-CM | POA: Diagnosis not present

## 2022-06-20 DIAGNOSIS — W19XXXA Unspecified fall, initial encounter: Secondary | ICD-10-CM

## 2022-06-20 DIAGNOSIS — R109 Unspecified abdominal pain: Secondary | ICD-10-CM | POA: Diagnosis not present

## 2022-06-20 DIAGNOSIS — I10 Essential (primary) hypertension: Secondary | ICD-10-CM | POA: Diagnosis not present

## 2022-06-20 DIAGNOSIS — M549 Dorsalgia, unspecified: Secondary | ICD-10-CM | POA: Diagnosis not present

## 2022-06-20 DIAGNOSIS — M79662 Pain in left lower leg: Secondary | ICD-10-CM | POA: Diagnosis not present

## 2022-06-20 DIAGNOSIS — I700 Atherosclerosis of aorta: Secondary | ICD-10-CM | POA: Diagnosis not present

## 2022-06-20 LAB — CBC WITH DIFFERENTIAL/PLATELET
Abs Immature Granulocytes: 0.03 10*3/uL (ref 0.00–0.07)
Basophils Absolute: 0.1 10*3/uL (ref 0.0–0.1)
Basophils Relative: 1 %
Eosinophils Absolute: 0.3 10*3/uL (ref 0.0–0.5)
Eosinophils Relative: 4 %
HCT: 33.1 % — ABNORMAL LOW (ref 36.0–46.0)
Hemoglobin: 11.2 g/dL — ABNORMAL LOW (ref 12.0–15.0)
Immature Granulocytes: 0 %
Lymphocytes Relative: 31 %
Lymphs Abs: 2.4 10*3/uL (ref 0.7–4.0)
MCH: 30.9 pg (ref 26.0–34.0)
MCHC: 33.8 g/dL (ref 30.0–36.0)
MCV: 91.4 fL (ref 80.0–100.0)
Monocytes Absolute: 0.7 10*3/uL (ref 0.1–1.0)
Monocytes Relative: 9 %
Neutro Abs: 4.3 10*3/uL (ref 1.7–7.7)
Neutrophils Relative %: 55 %
Platelets: 238 10*3/uL (ref 150–400)
RBC: 3.62 MIL/uL — ABNORMAL LOW (ref 3.87–5.11)
RDW: 14.5 % (ref 11.5–15.5)
WBC: 7.7 10*3/uL (ref 4.0–10.5)
nRBC: 0 % (ref 0.0–0.2)

## 2022-06-20 LAB — URINALYSIS, ROUTINE W REFLEX MICROSCOPIC
Bacteria, UA: NONE SEEN
Bilirubin Urine: NEGATIVE
Glucose, UA: NEGATIVE mg/dL
Hgb urine dipstick: NEGATIVE
Ketones, ur: NEGATIVE mg/dL
Leukocytes,Ua: NEGATIVE
Nitrite: NEGATIVE
Protein, ur: NEGATIVE mg/dL
Specific Gravity, Urine: 1.044 — ABNORMAL HIGH (ref 1.005–1.030)
pH: 7 (ref 5.0–8.0)

## 2022-06-20 LAB — COMPREHENSIVE METABOLIC PANEL
ALT: 10 U/L (ref 0–44)
AST: 14 U/L — ABNORMAL LOW (ref 15–41)
Albumin: 3.3 g/dL — ABNORMAL LOW (ref 3.5–5.0)
Alkaline Phosphatase: 60 U/L (ref 38–126)
Anion gap: 9 (ref 5–15)
BUN: 25 mg/dL — ABNORMAL HIGH (ref 8–23)
CO2: 26 mmol/L (ref 22–32)
Calcium: 9.1 mg/dL (ref 8.9–10.3)
Chloride: 93 mmol/L — ABNORMAL LOW (ref 98–111)
Creatinine, Ser: 1.37 mg/dL — ABNORMAL HIGH (ref 0.44–1.00)
GFR, Estimated: 44 mL/min — ABNORMAL LOW (ref >60)
Glucose, Bld: 86 mg/dL (ref 70–99)
Potassium: 4.7 mmol/L (ref 3.5–5.1)
Sodium: 128 mmol/L — ABNORMAL LOW (ref 135–145)
Total Bilirubin: 0.4 mg/dL (ref 0.3–1.2)
Total Protein: 6.6 g/dL (ref 6.5–8.1)

## 2022-06-20 LAB — CK: Total CK: 66 U/L (ref 38–234)

## 2022-06-20 MED ORDER — OXYCODONE HCL 5 MG PO TABS
10.0000 mg | ORAL_TABLET | Freq: Once | ORAL | Status: AC
  Administered 2022-06-20: 10 mg via ORAL
  Filled 2022-06-20: qty 2

## 2022-06-20 MED ORDER — NYSTATIN 100000 UNIT/GM EX POWD
1.0000 | Freq: Three times a day (TID) | CUTANEOUS | 0 refills | Status: DC
Start: 2022-06-20 — End: 2023-07-01

## 2022-06-20 MED ORDER — MICONAZOLE NITRATE 2 % EX POWD
Freq: Three times a day (TID) | CUTANEOUS | 2 refills | Status: DC | PRN
Start: 2022-06-20 — End: 2022-07-10

## 2022-06-20 MED ORDER — OXYCODONE HCL 5 MG PO TABS: 5.0000 mg | ORAL_TABLET | Freq: Four times a day (QID) | ORAL | 0 refills | Status: DC | PRN

## 2022-06-20 MED ORDER — IOHEXOL 300 MG/ML  SOLN
100.0000 mL | Freq: Once | INTRAMUSCULAR | Status: AC | PRN
  Administered 2022-06-20: 100 mL via INTRAVENOUS

## 2022-06-20 MED ORDER — DIPHENHYDRAMINE HCL 50 MG/ML IJ SOLN
12.5000 mg | Freq: Once | INTRAMUSCULAR | Status: AC
  Administered 2022-06-20: 12.5 mg via INTRAVENOUS
  Filled 2022-06-20: qty 1

## 2022-06-20 MED ORDER — FLUCONAZOLE 200 MG PO TABS
ORAL_TABLET | ORAL | 0 refills | Status: DC
Start: 2022-06-20 — End: 2022-07-10

## 2022-06-20 MED ORDER — MORPHINE SULFATE (PF) 4 MG/ML IV SOLN
4.0000 mg | Freq: Once | INTRAVENOUS | Status: AC
  Administered 2022-06-20: 4 mg via INTRAVENOUS
  Filled 2022-06-20: qty 1

## 2022-06-20 NOTE — Telephone Encounter (Signed)
Copied from CRM [id number]. Topic: General - Inquiry >> Jun 20, 2022  1:54 PM [place] wrote: Reason for CRM: [place] [facility] from [facility] stated they received a referral regarding pt for home health services. However, pt advised them that she is with [facility].  Please advise.

## 2022-06-20 NOTE — ED Triage Notes (Signed)
Pt bib ems from home c/o lower back pain since yesterday. Pt states was moving things around her house and woke up today with pain. Took Ibuprofen and tylenol pta

## 2022-06-20 NOTE — Progress Notes (Signed)
LLE venous duplex has been completed.  Preliminary results given to Britni Henderly, PA-C.   Results can be found under chart review under CV PROC. 06/20/2022 4:35 PM Jowanda Heeg RVT, RDMS

## 2022-06-20 NOTE — Progress Notes (Signed)
Acute Office Visit  Subjective:     Patient ID: Nicole Austin, female    DOB: Apr 25, 1960, 62 y.o.   MRN: [id number]  Chief Complaint  Patient presents with   Follow-up    HPI Patient is in today for follow up on rash.  Patient has been seen here in the office last week for severe candidiasis rash under pannus and on upper thighs and groin area.  She was also seen in the ER for the same problem on 06/14/2022.  She has been treated with oral fluconazole for 1 week.  She is also using antifungal powders as well.  She had a wound culture taken from an ulceration within the rash, that was negative for MRSA.  She was prophylactically treated with doxycycline last week and finished this without issues.  She did have a home health order placed, however she was only seen at home once so far for help with her wound care.    Review of Systems  Constitutional:  Negative for chills and fever.  Skin:  Positive for itching and rash.        Objective:    Pulse 88   Temp 97.9 F (36.6 C)   Resp 16   Ht  (1.549 m)   LMP  (LMP Unknown)   SpO2 92%   BMI 42.36 kg/m  BP Readings from Last 3 Encounters:  06/14/22 (!) 152/67  06/14/22 130/74  06/12/22 (!) 162/69   Wt Readings from Last 3 Encounters:  06/14/22 224 lb 3.2 oz (101.7 kg)  05/17/22 223 lb 15.8 oz (101.6 kg)  05/11/22 224 lb 3.3 oz (101.7 kg)      Physical Exam Constitutional:      Appearance: Normal appearance.     Comments: Presenting in wheelchair  HENT:     Head: Normocephalic and atraumatic.  Eyes:     Conjunctiva/sclera: Conjunctivae normal.  Cardiovascular:     Rate and Rhythm: Normal rate and regular rhythm.  Pulmonary:     Effort: Pulmonary effort is normal.     Breath sounds: Normal breath sounds.  Skin:    Comments: Extensive candidiasis rash under pannus, tops of thighs and groin area.  This is the first time I am seeing the rash, however compared to photographs in her chart it does appear to be  improving.  Patient agrees.  There is a small area of ulceration with good granulation tissue, does not appear to be infected.  Neurological:     General: No focal deficit present.     Mental Status: She is alert. Mental status is at baseline.  Psychiatric:        Mood and Affect: Mood normal.        Behavior: Behavior normal.     No results found for any visits on 06/20/22.      Assessment & Plan:   1. Intertrigo: Another referral to home health placed with more specific directions, patient needs to be seen about every 2 to 3 days to help with wound care.  Continue nystatin and miconazole powder.  Patient counseled to let the area air dry and to keep it clean.  Will treat with oral Diflucan 200 mg every 3 days for 3 doses and then weekly for the next 4 weeks.  Patient did have an EKG on 06/12/2022 with appropriate QT interval.  Follow-up here in 4 weeks for recheck or sooner as needed.  - Ambulatory referral to [facility] Duceynazole (MICOTIN) 2 %  powder; Apply topically 3 (three) times daily as needed for itching.  Dispense: 70 g; Refill: 2 - nystatin (MYCOSTATIN/NYSTOP) powder; Apply 1 Application topically 3 (three) times daily.  Dispense: 15 g; Refill: 0 - fluconazole (DIFLUCAN) 200 MG tablet; Take 200 mg tablet once every 3 days for 3 doses, then take one 200 mg tablet weekly for 4 weeks.  Dispense: 8 tablet; Refill: 0  Return in about 4 weeks (around 07/18/2022).  Margarita Mail, DO

## 2022-06-20 NOTE — Telephone Encounter (Signed)
I do not recall me giving you anything related HH for this patient.

## 2022-06-20 NOTE — Discharge Instructions (Addendum)
Follow up with orthopedics

## 2022-06-20 NOTE — ED Notes (Signed)
Pt urinated in bedside commode. Unable to collect sample due to tissue in sample

## 2022-06-20 NOTE — Telephone Encounter (Signed)
Approval given to [facility]

## 2022-06-20 NOTE — Telephone Encounter (Signed)
Jatana from [facility] checking status of pt orders. She says line is secure for verbal approval,or  fax is  [phone]

## 2022-06-20 NOTE — ED Notes (Signed)
patient ambulated to bedside commode

## 2022-06-20 NOTE — Patient Instructions (Signed)
It was great seeing you today!  Plan discussed at today's visit: -Continue to work to keep area dry and clean -New home health order placed -Use topical powders 2-3 times daily -Take oral anti-fungal pill 200 mg every 3 days for 3 doses, then switch to weekly dosing for 4 weeks.   Follow up in: 1 month  Take care and let us know if you have any questions or concerns prior to your next visit.  Dr. Caralee Ates

## 2022-06-20 NOTE — ED Provider Notes (Signed)
[place] EMERGENCY DEPARTMENT AT [facility] Provider Note   CSN: [id number] Arrival date & time: 06/20/22  1414     History  Hip pain  Nicole Austin is a 62 y.o. female history of polysubstance use, osteoporosis, peripheral neuropathy, bipolar, obesity, prolonged QT, COPD, chronic pain, avascular necrosis here for evaluation of left hip and leg pain.  States she was seen recently for a fall.  She continues to have pain.  Feels worse than her chronic pain.  She has been out of her home narcotic pain medicine.  States she was supposed to have surgery on her hip however this was canceled and she is not sure why.  She has noted that she has been treated for rash to her abdomen which has actually improved from its baseline.  No dysuria hematuria.  No flank pain, abdominal pain.  States she feels like she has some lower back pain which goes into her left hip.  No bowel or bladder incontinence, no saddle paresthesia.  No numbness or weakness.  Occasionally leg will feel heavy when she tries to ambulate.  She uses a walker and a wheelchair.  States she does not ambulate much at baseline.  No redness, warmth to extremities.  No lower extremity swelling.  No fever, chest pain or shortness of breath.  Denies head injury  Fall 2 weeks ago  Seen by PCP today for rash, which per note has improved  HPI     Home Medications Prior to Admission medications   Medication Sig Start Date End Date Taking? Authorizing Provider  oxyCODONE (ROXICODONE) 5 MG immediate release tablet Take 1 tablet (5 mg total) by mouth every 6 (six) hours as needed for severe pain. 06/20/22  Yes Linley Moxley A, PA-C  acetaminophen (TYLENOL) 500 MG tablet Take 2 tablets (1,000 mg total) by mouth every 8 (eight) hours as needed. 02/08/22   Danelle Berry, PA-C  albuterol (VENTOLIN HFA) 108 (90 Base) MCG/ACT inhaler Inhale 2 puffs into the lungs every 4 (four) hours as needed for wheezing or shortness of breath.  11/08/21   Danelle Berry, PA-C  ALPRAZolam Prudy Feeler) 0.5 MG tablet Take 1 tablet (0.5 mg total) by mouth 2 (two) times daily. 04/10/22   Marrion Coy, MD  atorvastatin (LIPITOR) 40 MG tablet TAKE 1 TABLET BY MOUTH EVERY NIGHT AT BEDTIME 04/25/21   Margarita Mail, DO  divalproex (DEPAKOTE) 250 MG DR tablet Take 1 tablet (250 mg total) by mouth 2 (two) times daily. 11/19/16   Brandy Hale, MD  doxycycline (VIBRA-TABS) 100 MG tablet Take 1 tablet (100 mg total) by mouth 2 (two) times daily. 06/14/22   Menshew, Charlesetta Ivory, PA-C  escitalopram (LEXAPRO) 20 MG tablet Take 1 tablet (20 mg total) by mouth daily. 01/05/21 05/07/22  [place], Alvira Philips, DO  fluconazole (DIFLUCAN) 200 MG tablet Take 200 mg tablet once every 3 days for 3 doses, then take one 200 mg tablet weekly for 4 weeks. 06/20/22   Margarita Mail, DO  ibuprofen (ADVIL) 200 MG tablet Take 200 mg by mouth every 6 (six) hours as needed.    [provider]  lurasidone (LATUDA) 80 MG TABS tablet Take 1 tablet (80 mg total) by mouth daily with breakfast. 06/06/21   Caro Laroche, DO  meloxicam (MOBIC) 15 MG tablet Take 15 mg by mouth daily.    [provider]  miconazole (MICOTIN) 2 % powder Apply topically 3 (three) times daily as needed for itching. 06/20/22   Caralee Ates,  Gentry Fitz, DO  mupirocin ointment (BACTROBAN) 2 % Apply small about inside of both nostrils TWICE a day for the next 5 days. 05/07/22   Verlee Monte, NP  nystatin (MYCOSTATIN/NYSTOP) powder Apply 1 Application topically 2 (two) times daily. 06/14/22   Menshew, Charlesetta Ivory, PA-C  nystatin (MYCOSTATIN/NYSTOP) powder Apply 1 Application topically 3 (three) times daily. 06/20/22   Margarita Mail, DO  nystatin-triamcinolone ointment [facility]) Apply topically 2 (two) times daily as needed (to affected intact skin with yeast rash). 06/05/22   Danelle Berry, PA-C  OXYGEN Inhale 2 L into the lungs as needed.    [provider]  rOPINIRole (REQUIP XL) 4 MG 24 hr  tablet Take 4 mg by mouth at bedtime.    [provider]  senna-docusate (SENOKOT-S) 8.6-50 MG tablet Take 1 tablet by mouth 2 (two) times daily. 04/10/22   Marrion Coy, MD  Skin Protectants, Misc. (INTERDRY [id number]") SHEE Cut piece of fabric to fit area of skin affected and apply to skin for for up to 5 days, replace sooner if soiled 06/14/22   Danelle Berry, PA-C  Tiotropium Bromide Monohydrate (SPIRIVA RESPIMAT) 2.5 MCG/ACT AERS Inhale 2 puffs into the lungs daily. 11/21/21   Sharyn Creamer, MD      Allergies    Chantix [varenicline] and Glycopyrrolate    Review of Systems   Review of Systems  Constitutional: Negative.   HENT: Negative.    Respiratory: Negative.    Cardiovascular: Negative.   Gastrointestinal: Negative.   Genitourinary: Negative.   Musculoskeletal:  Positive for back pain.       Hip pain  Skin: Negative.   Neurological: Negative.   All other systems reviewed and are negative.   Physical Exam Updated Vital Signs BP (!) 117/95 (BP Location: Left Arm)   Pulse 64   Temp 98 F (36.7 C)   Resp 16   Ht  (1.549 m)   Wt 101 kg   LMP  (LMP Unknown)   SpO2 100%   BMI 42.07 kg/m  Physical Exam Vitals and nursing note reviewed.  Constitutional:      General: She is not in acute distress.    Appearance: She is well-developed. She is not ill-appearing, toxic-appearing or diaphoretic.  HENT:     Head: Atraumatic.  Eyes:     Pupils: Pupils are equal, round, and reactive to light.  Cardiovascular:     Rate and Rhythm: Normal rate.     Pulses: Normal pulses.          Radial pulses are 2+ on the right side and 2+ on the left side.       Dorsalis pedis pulses are 2+ on the right side and 2+ on the left side.       Posterior tibial pulses are 2+ on the right side and 2+ on the left side.     Heart sounds: Normal heart sounds.  Pulmonary:     Effort: Pulmonary effort is normal. No respiratory distress.     Breath sounds: Normal breath sounds.  Abdominal:      General: Bowel sounds are normal. There is no distension.     Palpations: Abdomen is soft.     Comments: Intertriginous rash to groin, abd pannus   Musculoskeletal:        General: Normal range of motion.     Cervical back: Normal range of motion.     Comments: Diffuse tenderness left hip.  Can lift bilateral legs off bed.  Nontender tib-fib, bilateral femur.  Nontender midline C/T/L tenderness.  Tenderness to left piriformis.  No overlying erythema, warmth, fluctuance or induration to bilateral hips.  Skin:    General: Skin is warm and dry.     Capillary Refill: Capillary refill takes less than 2 seconds.  Neurological:     General: No focal deficit present.     Mental Status: She is alert and oriented to person, place, and time.     Comments: Ambulatory, few steps which patient states is at baseline  Psychiatric:        Mood and Affect: Mood normal.     ED Results / Procedures / Treatments   Labs (all labs ordered are listed, but only abnormal results are displayed) Labs Reviewed  CBC WITH DIFFERENTIAL/PLATELET - Abnormal; Notable for the following components:      Result Value   RBC 3.62 (*)    Hemoglobin 11.2 (*)    HCT 33.1 (*)    All other components within normal limits  COMPREHENSIVE METABOLIC PANEL - Abnormal; Notable for the following components:   Sodium 128 (*)    Chloride 93 (*)    BUN 25 (*)    Creatinine, Ser 1.37 (*)    Albumin 3.3 (*)    AST 14 (*)    GFR, Estimated 44 (*)    All other components within normal limits  URINALYSIS, ROUTINE W REFLEX MICROSCOPIC - Abnormal; Notable for the following components:   Specific Gravity, Urine 1.044 (*)    All other components within normal limits  CK    EKG None  Radiology CT Hip Left Wo Contrast  Result Date: 06/20/2022 CLINICAL DATA:  Chronic left hip pain. Stress fracture suspected. Fall. Known avascular necrosis. EXAM: CT OF THE LEFT HIP WITHOUT CONTRAST TECHNIQUE: Multidetector CT imaging of the left  hip was performed according to the standard protocol. Multiplanar CT image reconstructions were also generated. RADIATION DOSE REDUCTION: This exam was performed according to the departmental dose-optimization program which includes automated exposure control, adjustment of the mA and/or kV according to patient size and/or use of iterative reconstruction technique. COMPARISON:  CT abdomen and pelvis 03/13/2022 FINDINGS: Bones/Joint/Cartilage Changes of avascular necrosis of both proximal femurs, progressing since the previous study. There is new cortical depression and deformity of the superior left femoral head likely representing acute cortical compression. Degenerative changes in the hips. No acute fractures demonstrated in the pelvis, sacrum, or right hip. Ligaments Suboptimally assessed by CT. Muscles and Tendons No acute abnormality. Soft tissues Scarring lateral to the right hip is unchanged since prior study, possibly previous postoperative change or old trauma. IMPRESSION: 1. Bilateral avascular necrosis of the hips, progressing since prior study. 2. Acute appearing cortical depression and associated deformity of the superior left femoral head, new since prior study. 3. Degenerative changes in the hips and lumbar spine. Electronically Signed   By: Burman Nieves M.D.   On: 06/20/2022 18:54   CT ABDOMEN PELVIS W CONTRAST  Result Date: 06/20/2022 CLINICAL DATA:  Left pelvic pain and left hip pain after a fall. Low back pain started 1 week ago. EXAM: CT ABDOMEN AND PELVIS WITH CONTRAST TECHNIQUE: Multidetector CT imaging of the abdomen and pelvis was performed using the standard protocol following bolus administration of intravenous contrast. RADIATION DOSE REDUCTION: This exam was performed according to the departmental dose-optimization program which includes automated exposure control, adjustment of the mA and/or kV according to patient size and/or use of iterative reconstruction technique. CONTRAST:    OMNIPAQUE IOHEXOL 300 MG/ML  SOLN COMPARISON:  03/13/2022 FINDINGS: Lower chest: Lung bases are clear. Hepatobiliary: No focal liver abnormality is seen. No gallstones, gallbladder wall thickening, or biliary dilatation. Pancreas: Unremarkable. No pancreatic ductal dilatation or surrounding inflammatory changes. Spleen: Normal in size without focal abnormality. Adrenals/Urinary Tract: Adrenal glands are unremarkable. Kidneys are normal, without renal calculi, focal lesion, or hydronephrosis. Bladder is unremarkable. Stomach/Bowel: Stomach is within normal limits. Appendix appears normal. No evidence of bowel wall thickening, distention, or inflammatory changes. Vascular/Lymphatic: Aortic atherosclerosis. No enlarged abdominal or pelvic lymph nodes. Reproductive: Uterus and bilateral adnexa are unremarkable. Other: No abdominal wall hernia or abnormality. No abdominopelvic ascites. Musculoskeletal: Degenerative changes in the spine. Acute appearing fracture of the anterior left ninth rib. Old rib fractures. Sclerosis, lucency, and deformities of the femoral heads bilaterally consistent with avascular necrosis, greater on the left. There may be acute depression of the left femoral head, progressing since prior study. IMPRESSION: 1. No acute process demonstrated in the abdomen or pelvis. No evidence of bowel obstruction or inflammation. 2. Aortic atherosclerosis. 3. Changes of bilateral avascular necrosis in the hips, progressing since prior study. Probable acute depression of the left femoral head superiorly. 4. Acute and chronic left rib fractures. Electronically Signed   By: Burman Nieves M.D.   On: 06/20/2022 18:51   CT L-SPINE NO CHARGE  Result Date: 06/20/2022 CLINICAL DATA:  Chronic hip pain. Low back pain beginning 1 week ago. Hip pain is on the left. EXAM: CT LUMBAR SPINE WITHOUT CONTRAST TECHNIQUE: Multidetector CT imaging of the lumbar spine was performed without intravenous contrast  administration. Multiplanar CT image reconstructions were also generated. RADIATION DOSE REDUCTION: This exam was performed according to the departmental dose-optimization program which includes automated exposure control, adjustment of the mA and/or kV according to patient size and/or use of iterative reconstruction technique. COMPARISON:  MRI 03/13/2022 FINDINGS: Segmentation: 5 lumbar type vertebral bodies as numbered previously. Alignment: Mild scoliotic curvature convex to the left. No listhesis. Vertebrae: No fracture or focal bone lesion. Paraspinal and other soft tissues: Negative Disc levels: No significant finding from T11-12 through L2-3. L3-4: Mild bulging of the disc. Mild facet osteoarthritis. No compressive stenosis. L4-5: Bulging of the disc. Bilateral facet degeneration and hypertrophy with gaping joints. Mild stenosis of both lateral recesses. This appearance could worsen with standing or flexion, when anterolisthesis would likely occur based on the morphology of the facet arthropathy. The facet disease could be a cause of back pain or referred facet syndrome pain in addition. L5-S1: Mild bulging of the disc. Bilateral facet osteoarthritis. No canal or foraminal stenosis. The facet arthritis could be painful. IMPRESSION: 1. L4-5: Bulging of the disc. Bilateral facet degeneration and hypertrophy with gaping joints. Mild stenosis of both lateral recesses. This appearance could worsen with standing or flexion, when anterolisthesis would likely occur based on the morphology of the facet arthropathy. The facet disease could be a cause of back pain or referred facet syndrome pain in addition. 2. L5-S1: Mild bulging of the disc. Bilateral facet osteoarthritis. No canal or foraminal stenosis. The facet arthritis could be a cause of back pain or referred facet syndrome pain in addition. Electronically Signed   By: [name] Fusi M.D.   On: 06/20/2022 18:40   VAS [place] LOWER EXTREMITY VENOUS (DVT) (ONLY [facility] &  [facility])  Result Date: 06/20/2022  Lower Venous DVT Study Patient Name:  Nicole Austin  Date of Exam:   06/20/2022 Medical Rec #: [id number]  Accession #:    [id number] Date of Birth: [birth year]              Patient Gender: F Patient Age:   44 years Exam Location:  [facility] Procedure:      VAS [place] LOWER EXTREMITY VENOUS (DVT) Referring Phys: Jannette Cotham --------------------------------------------------------------------------------  Indications: Left hip pain.  Limitations: Poor ultrasound/tissue interface. Comparison Study: Previous exam on 10/27/15 was negative for DVT Performing Technologist: Ernestene Mention RVT, RDMS  Examination Guidelines: A complete evaluation includes B-mode imaging, spectral Doppler, color Doppler, and power Doppler as needed of all accessible portions of each vessel. Bilateral testing is considered an integral part of a complete examination. Limited examinations for reoccurring indications may be performed as noted. The reflux portion of the exam is performed with the patient in reverse Trendelenburg.  +-----+---------------+---------+-----------+----------+--------------+ RIGHTCompressibilityPhasicitySpontaneityPropertiesThrombus Aging +-----+---------------+---------+-----------+----------+--------------+ CFV  Full           Yes      Yes                                 +-----+---------------+---------+-----------+----------+--------------+   +---------+---------------+---------+-----------+----------+-------------------+ LEFT     CompressibilityPhasicitySpontaneityPropertiesThrombus Aging      +---------+---------------+---------+-----------+----------+-------------------+ CFV      Full           Yes      Yes                                      +---------+---------------+---------+-----------+----------+-------------------+ SFJ      Full                                                              +---------+---------------+---------+-----------+----------+-------------------+ FV Prox  Full           Yes      Yes                                      +---------+---------------+---------+-----------+----------+-------------------+ FV Mid   Full           Yes      Yes                                      +---------+---------------+---------+-----------+----------+-------------------+ FV DistalFull           Yes      Yes                                      +---------+---------------+---------+-----------+----------+-------------------+ PFV      Full                                                             +---------+---------------+---------+-----------+----------+-------------------+ POP      Full  Yes      Yes                                      +---------+---------------+---------+-----------+----------+-------------------+ PTV      Full                                                             +---------+---------------+---------+-----------+----------+-------------------+ PERO                                                  Not well visualized +---------+---------------+---------+-----------+----------+-------------------+    Summary: RIGHT: - No evidence of common femoral vein obstruction.  LEFT: - There is no evidence of deep vein thrombosis in the lower extremity.  - No cystic structure found in the popliteal fossa.  *See table(s) above for measurements and observations.    Preliminary     Procedures Procedures    Medications Ordered in ED Medications  morphine (PF) 4 MG/ML injection 4 mg (4 mg Intravenous Given 06/20/22 1740)  diphenhydrAMINE (BENADRYL) injection 12.5 mg (12.5 mg Intravenous Given 06/20/22 1749)  iohexol (OMNIPAQUE) 300 MG/ML solution 100 mL (100 mLs Intravenous Contrast Given 06/20/22 1802)  oxyCODONE (Oxy IR/ROXICODONE) immediate release tablet 10 mg (10 mg Oral Given 06/20/22 2024)    ED Course/ Medical  Decision Making/ A&P Clinical Course as of 06/20/22 2157  Wed Jun 20, 2022  1639 VAS [place] LOWER EXTREMITY VENOUS (DVT) (ONLY [facility] & [facility]) Neg for dvt [BH]  2121 Luke with Ortho, weight baring as tolerated, FU outpatient [BH]    Clinical Course User Index [BH] Saiquan Hands A, PA-C   62 year old known chronic pain here in ED for fall.  Had fall sounds like a few weeks ago and she has had continued pain to her left hip.  Has known avascular necrosis.  Extensive chart review she has known history of avascular necrosis.  It appears per notes she has been discharged from prior orthopedist office.  She does not follow-up with pain management.  She has no redness, warmth to her joints.  I will suspicion for septic joint, overlying cellulitis.  She has been treated for intertrigo to her abdominal pannus and groin which is consistent with exam today.  I have low suspicion for cellulitis, necrotizing fasciitis, Fournier's gangrene.  She has equal pulses bilaterally she has no obvious edema, erythema or warmth to her extremities.  Low suspicion for compartment syndrome, rhabdomyolysis, DVT.  She has 2+ pulses to her lower extremities low suspicion for acute arterial occlusion.  Her abdomen is soft, nontender.  Low suspicion for acute intra-abdominal process such as abscess.  Will plan on repeat imaging, labs given she states this is worse than her chronic pain.  Labs and imaging personally viewed  and interpreted:  UA negative for infection CBC without leukocytosis Metabolic panel creatinine 1.37, sodium 128, appears to be at baseline CK 66 Ultrasound negative for DVT D abdomen/pelvis without significant abnormality CT L-spine disc bulge, facet arthritis CT hip with avascular necrosis, cortical depression  Lets base with orthopedics for further management  Consult with Franky Macho,  orthopedics.  Has reviewed patient's imaging.  He recommends weightbearing as tolerated, follow-up outpatient  Discussed results  with patient.  She has had good pain control here.  She is ambulatory at her baseline per patient.  Will have her follow-up outpatient.  I did discuss with patient her frequent narcotic prescriptions from various providers in the emergency department.  I encouraged her to follow-up with orthopedics/pain management for further prescriptions narcotic pain medicine.  Will have her return for new or worsening symptoms  The patient has been appropriately medically screened and/or stabilized in the ED. I have low suspicion for any other emergent medical condition which would require further screening, evaluation or treatment in the ED or require inpatient management.  Patient is hemodynamically stable and in no acute distress.  Patient able to ambulate in department prior to ED.  Evaluation does not show acute pathology that would require ongoing or additional emergent interventions while in the emergency department or further inpatient treatment.  I have discussed the diagnosis with the patient and answered all questions.  Pain is been managed while in the emergency department and patient has no further complaints prior to discharge.  Patient is comfortable with plan discussed in room and is stable for discharge at this time.  I have discussed strict return precautions for returning to the emergency department.  Patient was encouraged to follow-up with PCP/specialist refer to at discharge.                              Medical Decision Making Amount and/or Complexity of Data Reviewed External Data Reviewed: labs, radiology and notes. Labs: ordered. Decision-making details documented in ED Course. Radiology: ordered and independent interpretation performed. Decision-making details documented in ED Course.  Risk OTC drugs. Prescription drug management. Parenteral controlled substances. Decision regarding hospitalization. Diagnosis or treatment significantly limited by social determinants of  health.          Final Clinical Impression(s) / ED Diagnoses Final diagnoses:  Fall, initial encounter  Chronic left hip pain    Rx / DC Orders ED Discharge Orders          Ordered    oxyCODONE (ROXICODONE) 5 MG immediate release tablet  Every 6 hours PRN        06/20/22 2142              Brendy Ficek A, PA-C 06/20/22 2157    Arby Barrette, MD 06/21/22 1519

## 2022-06-21 ENCOUNTER — Ambulatory Visit: Payer: 59 | Admitting: Family Medicine

## 2022-06-21 DIAGNOSIS — B372 Candidiasis of skin and nail: Secondary | ICD-10-CM | POA: Diagnosis not present

## 2022-06-22 DIAGNOSIS — B372 Candidiasis of skin and nail: Secondary | ICD-10-CM | POA: Diagnosis not present

## 2022-06-26 ENCOUNTER — Telehealth: Payer: Self-pay

## 2022-06-26 DIAGNOSIS — B372 Candidiasis of skin and nail: Secondary | ICD-10-CM | POA: Diagnosis not present

## 2022-06-26 NOTE — Telephone Encounter (Signed)
Transition Care Management Unsuccessful Follow-up Telephone Call  Date of discharge and from where:  06/20/2022 [facility].  Attempts:  1st Attempt  Reason for unsuccessful TCM follow-up call:  Left voice message  Amen Staszak [facility]  [facility] Population Health Community Resource Care Guide   [email or web address]  ?? [phone]   Website: [email or web address]  [email or web address]

## 2022-06-27 ENCOUNTER — Telehealth: Payer: Self-pay | Admitting: Family Medicine

## 2022-06-27 ENCOUNTER — Telehealth: Payer: Self-pay

## 2022-06-27 DIAGNOSIS — B372 Candidiasis of skin and nail: Secondary | ICD-10-CM | POA: Diagnosis not present

## 2022-06-27 DIAGNOSIS — M87059 Idiopathic aseptic necrosis of unspecified femur: Secondary | ICD-10-CM | POA: Diagnosis not present

## 2022-06-27 NOTE — Telephone Encounter (Signed)
Patient called, left VM to return the call to the office. [facility] called and spoke to [place], [facility] about the refill(s) fluconazole requested. He says that he spoke to the patient and she said she didn't read the instructions and had taken them all up. He says this will need to be addressed with the provider to determine next steps. Advised I will send to the provider.

## 2022-06-27 NOTE — Telephone Encounter (Signed)
Pt is calling in because she wants to stop taking Meloxicam and start with Celebrex. Pt says she was told she couldn't take both medications at the same time. Pt says she was taking the medication fluconazole (DIFLUCAN) 200 MG tablet [[id number]] wrong. Pt says she was taking a higher dosage than she was supposed to and needs it refilled.

## 2022-06-27 NOTE — Telephone Encounter (Signed)
Transition Care Management Unsuccessful Follow-up Telephone Call  Date of discharge and from where:  06/20/2022 [facility]  Attempts:  2nd Attempt  Reason for unsuccessful TCM follow-up call:  Left voice message  Derionna Salvador [facility]  [facility] Population Health Community Resource Care Guide   ??[email or web address]  ?? [phone]   Website: [email or web address]  [email or web address]

## 2022-06-27 NOTE — Telephone Encounter (Signed)
Medication Refill - Medication: fluconazole (DIFLUCAN) 200 MG tablet [[id number]]   Has the patient contacted their pharmacy? Yes.    (Agent: If yes, when and what did the pharmacy advise?) Contact PCP   Preferred Pharmacy (with phone number or street name): [facility] - [place], [place] - [address]  [address], [place] [address]   Has the patient been seen for an appointment in the last year OR does the patient have an upcoming appointment? Yes.    Agent: Please be advised that RX refills may take up to 3 business days. We ask that you follow-up with your pharmacy.

## 2022-06-28 DIAGNOSIS — B372 Candidiasis of skin and nail: Secondary | ICD-10-CM | POA: Diagnosis not present

## 2022-06-28 NOTE — Telephone Encounter (Signed)
Pt.notified

## 2022-06-28 NOTE — Telephone Encounter (Signed)
Pt took DIFLUCAN pill back to back. Wanting to know if you would refill, which I told pt most likely not since that would be more doses. FYI

## 2022-06-29 ENCOUNTER — Telehealth: Payer: Self-pay | Admitting: Family Medicine

## 2022-06-29 NOTE — Telephone Encounter (Signed)
No answer from Darral Dash did leave a detailed vm.

## 2022-06-29 NOTE — Telephone Encounter (Signed)
Home Health Verbal Orders -  Caller/Agency: [facility] Callback Number:  [phone] Requesting OT/PT/Skilled Nursing/Social Work/Speech Therapy: OT Frequency: 1w2

## 2022-07-03 ENCOUNTER — Ambulatory Visit: Payer: 59 | Admitting: Family Medicine

## 2022-07-03 ENCOUNTER — Telehealth: Payer: Self-pay | Admitting: Family Medicine

## 2022-07-03 DIAGNOSIS — B372 Candidiasis of skin and nail: Secondary | ICD-10-CM | POA: Diagnosis not present

## 2022-07-03 NOTE — Telephone Encounter (Signed)
Home Health Verbal Orders - Caller/Agency: [facility] Number: [phone] option 2  Requesting OT/PT/Skilled Nursing/Social Work/Speech Therapy: Nursing 1 time a week for 4 weeks

## 2022-07-04 ENCOUNTER — Encounter: Payer: Self-pay | Admitting: Emergency Medicine

## 2022-07-04 ENCOUNTER — Inpatient Hospital Stay
Admission: EM | Admit: 2022-07-04 | Discharge: 2022-07-10 | DRG: 565 | Disposition: A | Discharge status: Skilled Nursing Facility | Payer: 59 | Attending: Internal Medicine | Admitting: Internal Medicine

## 2022-07-04 ENCOUNTER — Emergency Department: Payer: 59

## 2022-07-04 ENCOUNTER — Other Ambulatory Visit: Payer: Self-pay

## 2022-07-04 DIAGNOSIS — Z791 Long term (current) use of non-steroidal anti-inflammatories (NSAID): Secondary | ICD-10-CM

## 2022-07-04 DIAGNOSIS — I2510 Atherosclerotic heart disease of native coronary artery without angina pectoris: Secondary | ICD-10-CM | POA: Diagnosis present

## 2022-07-04 DIAGNOSIS — M879 Osteonecrosis, unspecified: Secondary | ICD-10-CM | POA: Diagnosis present

## 2022-07-04 DIAGNOSIS — R7303 Prediabetes: Secondary | ICD-10-CM | POA: Diagnosis present

## 2022-07-04 DIAGNOSIS — G4730 Sleep apnea, unspecified: Secondary | ICD-10-CM | POA: Diagnosis present

## 2022-07-04 DIAGNOSIS — W06XXXA Fall from bed, initial encounter: Secondary | ICD-10-CM | POA: Diagnosis present

## 2022-07-04 DIAGNOSIS — M6281 Muscle weakness (generalized): Secondary | ICD-10-CM | POA: Diagnosis not present

## 2022-07-04 DIAGNOSIS — G2581 Restless legs syndrome: Secondary | ICD-10-CM | POA: Diagnosis present

## 2022-07-04 DIAGNOSIS — T796XXA Traumatic ischemia of muscle, initial encounter: Principal | ICD-10-CM | POA: Diagnosis present

## 2022-07-04 DIAGNOSIS — Z9981 Dependence on supplemental oxygen: Secondary | ICD-10-CM | POA: Diagnosis not present

## 2022-07-04 DIAGNOSIS — Z961 Presence of intraocular lens: Secondary | ICD-10-CM | POA: Diagnosis present

## 2022-07-04 DIAGNOSIS — G20A1 Parkinson's disease without dyskinesia, without mention of fluctuations: Secondary | ICD-10-CM | POA: Diagnosis not present

## 2022-07-04 DIAGNOSIS — Y92003 Bedroom of unspecified non-institutional (private) residence as the place of occurrence of the external cause: Secondary | ICD-10-CM

## 2022-07-04 DIAGNOSIS — Z5986 Financial insecurity: Secondary | ICD-10-CM

## 2022-07-04 DIAGNOSIS — F319 Bipolar disorder, unspecified: Secondary | ICD-10-CM | POA: Diagnosis present

## 2022-07-04 DIAGNOSIS — G8929 Other chronic pain: Secondary | ICD-10-CM | POA: Diagnosis not present

## 2022-07-04 DIAGNOSIS — Z6841 Body Mass Index (BMI) 40.0 and over, adult: Secondary | ICD-10-CM

## 2022-07-04 DIAGNOSIS — W19XXXA Unspecified fall, initial encounter: Secondary | ICD-10-CM

## 2022-07-04 DIAGNOSIS — F909 Attention-deficit hyperactivity disorder, unspecified type: Secondary | ICD-10-CM | POA: Diagnosis present

## 2022-07-04 DIAGNOSIS — Z9842 Cataract extraction status, left eye: Secondary | ICD-10-CM

## 2022-07-04 DIAGNOSIS — M25562 Pain in left knee: Secondary | ICD-10-CM | POA: Diagnosis not present

## 2022-07-04 DIAGNOSIS — E871 Hypo-osmolality and hyponatremia: Secondary | ICD-10-CM | POA: Diagnosis present

## 2022-07-04 DIAGNOSIS — J4489 Other specified chronic obstructive pulmonary disease: Secondary | ICD-10-CM | POA: Diagnosis present

## 2022-07-04 DIAGNOSIS — E785 Hyperlipidemia, unspecified: Secondary | ICD-10-CM | POA: Diagnosis present

## 2022-07-04 DIAGNOSIS — Z87891 Personal history of nicotine dependence: Secondary | ICD-10-CM

## 2022-07-04 DIAGNOSIS — Z043 Encounter for examination and observation following other accident: Secondary | ICD-10-CM | POA: Diagnosis not present

## 2022-07-04 DIAGNOSIS — Z8673 Personal history of transient ischemic attack (TIA), and cerebral infarction without residual deficits: Secondary | ICD-10-CM

## 2022-07-04 DIAGNOSIS — R079 Chest pain, unspecified: Secondary | ICD-10-CM | POA: Diagnosis not present

## 2022-07-04 DIAGNOSIS — Z818 Family history of other mental and behavioral disorders: Secondary | ICD-10-CM

## 2022-07-04 DIAGNOSIS — Z888 Allergy status to other drugs, medicaments and biological substances status: Secondary | ICD-10-CM

## 2022-07-04 DIAGNOSIS — K219 Gastro-esophageal reflux disease without esophagitis: Secondary | ICD-10-CM | POA: Diagnosis not present

## 2022-07-04 DIAGNOSIS — J449 Chronic obstructive pulmonary disease, unspecified: Secondary | ICD-10-CM | POA: Diagnosis present

## 2022-07-04 DIAGNOSIS — J441 Chronic obstructive pulmonary disease with (acute) exacerbation: Secondary | ICD-10-CM | POA: Diagnosis not present

## 2022-07-04 DIAGNOSIS — M6282 Rhabdomyolysis: Secondary | ICD-10-CM | POA: Diagnosis not present

## 2022-07-04 DIAGNOSIS — S0990XA Unspecified injury of head, initial encounter: Secondary | ICD-10-CM | POA: Diagnosis present

## 2022-07-04 DIAGNOSIS — T796XXS Traumatic ischemia of muscle, sequela: Secondary | ICD-10-CM | POA: Diagnosis not present

## 2022-07-04 DIAGNOSIS — M25462 Effusion, left knee: Secondary | ICD-10-CM | POA: Diagnosis not present

## 2022-07-04 DIAGNOSIS — Z7401 Bed confinement status: Secondary | ICD-10-CM | POA: Diagnosis not present

## 2022-07-04 DIAGNOSIS — E222 Syndrome of inappropriate secretion of antidiuretic hormone: Secondary | ICD-10-CM | POA: Insufficient documentation

## 2022-07-04 DIAGNOSIS — M8700 Idiopathic aseptic necrosis of unspecified bone: Secondary | ICD-10-CM | POA: Diagnosis present

## 2022-07-04 DIAGNOSIS — I959 Hypotension, unspecified: Secondary | ICD-10-CM | POA: Diagnosis not present

## 2022-07-04 DIAGNOSIS — M25559 Pain in unspecified hip: Secondary | ICD-10-CM | POA: Diagnosis not present

## 2022-07-04 DIAGNOSIS — N179 Acute kidney failure, unspecified: Secondary | ICD-10-CM | POA: Diagnosis present

## 2022-07-04 DIAGNOSIS — S199XXA Unspecified injury of neck, initial encounter: Secondary | ICD-10-CM | POA: Diagnosis not present

## 2022-07-04 DIAGNOSIS — R6889 Other general symptoms and signs: Secondary | ICD-10-CM | POA: Diagnosis not present

## 2022-07-04 DIAGNOSIS — S3992XA Unspecified injury of lower back, initial encounter: Secondary | ICD-10-CM | POA: Diagnosis not present

## 2022-07-04 DIAGNOSIS — Z743 Need for continuous supervision: Secondary | ICD-10-CM | POA: Diagnosis not present

## 2022-07-04 DIAGNOSIS — M25552 Pain in left hip: Secondary | ICD-10-CM

## 2022-07-04 DIAGNOSIS — I252 Old myocardial infarction: Secondary | ICD-10-CM

## 2022-07-04 DIAGNOSIS — D508 Other iron deficiency anemias: Secondary | ICD-10-CM | POA: Diagnosis not present

## 2022-07-04 DIAGNOSIS — M810 Age-related osteoporosis without current pathological fracture: Secondary | ICD-10-CM | POA: Diagnosis present

## 2022-07-04 DIAGNOSIS — M545 Low back pain, unspecified: Secondary | ICD-10-CM | POA: Diagnosis not present

## 2022-07-04 DIAGNOSIS — Z9841 Cataract extraction status, right eye: Secondary | ICD-10-CM

## 2022-07-04 DIAGNOSIS — M542 Cervicalgia: Secondary | ICD-10-CM | POA: Diagnosis not present

## 2022-07-04 DIAGNOSIS — Z833 Family history of diabetes mellitus: Secondary | ICD-10-CM

## 2022-07-04 DIAGNOSIS — Z79899 Other long term (current) drug therapy: Secondary | ICD-10-CM

## 2022-07-04 DIAGNOSIS — E66813 Obesity, class 3: Secondary | ICD-10-CM | POA: Diagnosis present

## 2022-07-04 DIAGNOSIS — D509 Iron deficiency anemia, unspecified: Secondary | ICD-10-CM | POA: Insufficient documentation

## 2022-07-04 DIAGNOSIS — S299XXA Unspecified injury of thorax, initial encounter: Secondary | ICD-10-CM | POA: Diagnosis not present

## 2022-07-04 DIAGNOSIS — W19XXXS Unspecified fall, sequela: Secondary | ICD-10-CM | POA: Diagnosis not present

## 2022-07-04 DIAGNOSIS — Z820 Family history of epilepsy and other diseases of the nervous system: Secondary | ICD-10-CM

## 2022-07-04 DIAGNOSIS — Z825 Family history of asthma and other chronic lower respiratory diseases: Secondary | ICD-10-CM

## 2022-07-04 DIAGNOSIS — Z811 Family history of alcohol abuse and dependence: Secondary | ICD-10-CM

## 2022-07-04 DIAGNOSIS — S161XXA Strain of muscle, fascia and tendon at neck level, initial encounter: Secondary | ICD-10-CM

## 2022-07-04 DIAGNOSIS — R2681 Unsteadiness on feet: Secondary | ICD-10-CM | POA: Diagnosis not present

## 2022-07-04 DIAGNOSIS — L304 Erythema intertrigo: Secondary | ICD-10-CM | POA: Diagnosis present

## 2022-07-04 DIAGNOSIS — R9431 Abnormal electrocardiogram [ECG] [EKG]: Secondary | ICD-10-CM | POA: Diagnosis not present

## 2022-07-04 DIAGNOSIS — Z8614 Personal history of Methicillin resistant Staphylococcus aureus infection: Secondary | ICD-10-CM

## 2022-07-04 DIAGNOSIS — Z8249 Family history of ischemic heart disease and other diseases of the circulatory system: Secondary | ICD-10-CM

## 2022-07-04 DIAGNOSIS — Z96652 Presence of left artificial knee joint: Secondary | ICD-10-CM | POA: Diagnosis present

## 2022-07-04 DIAGNOSIS — R2689 Other abnormalities of gait and mobility: Secondary | ICD-10-CM | POA: Diagnosis not present

## 2022-07-04 DIAGNOSIS — M19012 Primary osteoarthritis, left shoulder: Secondary | ICD-10-CM | POA: Diagnosis not present

## 2022-07-04 HISTORY — DX: Unspecified fall, initial encounter: W19.XXXA

## 2022-07-04 HISTORY — DX: Traumatic ischemia of muscle, initial encounter: T79.6XXA

## 2022-07-04 LAB — HEPATIC FUNCTION PANEL
ALT: 22 U/L (ref 0–44)
AST: 71 U/L — ABNORMAL HIGH (ref 15–41)
Albumin: 3.3 g/dL — ABNORMAL LOW (ref 3.5–5.0)
Alkaline Phosphatase: 59 U/L (ref 38–126)
Bilirubin, Direct: <0.1 mg/dL (ref 0.0–0.2)
Total Bilirubin: 0.5 mg/dL (ref 0.3–1.2)
Total Protein: 6.4 g/dL — ABNORMAL LOW (ref 6.5–8.1)

## 2022-07-04 LAB — BASIC METABOLIC PANEL
Anion gap: 10 (ref 5–15)
BUN: 14 mg/dL (ref 8–23)
CO2: 29 mmol/L (ref 22–32)
Calcium: 9.2 mg/dL (ref 8.9–10.3)
Chloride: 96 mmol/L — ABNORMAL LOW (ref 98–111)
Creatinine, Ser: 1.07 mg/dL — ABNORMAL HIGH (ref 0.44–1.00)
GFR, Estimated: 58 mL/min — ABNORMAL LOW (ref >60)
Glucose, Bld: 81 mg/dL (ref 70–99)
Potassium: 3.6 mmol/L (ref 3.5–5.1)
Sodium: 135 mmol/L (ref 135–145)

## 2022-07-04 LAB — CBC
HCT: 35.9 % — ABNORMAL LOW (ref 36.0–46.0)
Hemoglobin: 11.4 g/dL — ABNORMAL LOW (ref 12.0–15.0)
MCH: 30.6 pg (ref 26.0–34.0)
MCHC: 31.8 g/dL (ref 30.0–36.0)
MCV: 96.5 fL (ref 80.0–100.0)
Platelets: 252 10*3/uL (ref 150–400)
RBC: 3.72 MIL/uL — ABNORMAL LOW (ref 3.87–5.11)
RDW: 14.6 % (ref 11.5–15.5)
WBC: 7.5 10*3/uL (ref 4.0–10.5)
nRBC: 0 % (ref 0.0–0.2)

## 2022-07-04 LAB — LACTIC ACID, PLASMA
Lactic Acid, Venous: 1.1 mmol/L (ref 0.5–1.9)
Lactic Acid, Venous: 0.8 mmol/L (ref 0.5–1.9)

## 2022-07-04 LAB — CK: Total CK: 3039 U/L — ABNORMAL HIGH (ref 38–234)

## 2022-07-04 LAB — TSH: TSH: 0.759 u[IU]/mL (ref 0.350–4.500)

## 2022-07-04 LAB — T4, FREE: Free T4: 0.81 ng/dL (ref 0.61–1.12)

## 2022-07-04 MED ORDER — LACTATED RINGERS IV SOLN: INTRAVENOUS | Status: DC

## 2022-07-04 MED ORDER — ALPRAZOLAM 0.5 MG PO TABS
0.5000 mg | ORAL_TABLET | Freq: Two times a day (BID) | ORAL | Status: DC
  Administered 2022-07-04 – 2022-07-10 (×12): 0.5 mg via ORAL
  Filled 2022-07-04 (×12): qty 1

## 2022-07-04 MED ORDER — ESCITALOPRAM OXALATE 10 MG PO TABS
20.0000 mg | ORAL_TABLET | Freq: Every day | ORAL | Status: DC
  Administered 2022-07-05 – 2022-07-10 (×6): 20 mg via ORAL
  Filled 2022-07-04 (×7): qty 2

## 2022-07-04 MED ORDER — ACETAMINOPHEN 650 MG RE SUPP: 650.0000 mg | Freq: Four times a day (QID) | RECTAL | Status: DC | PRN

## 2022-07-04 MED ORDER — HEPARIN SODIUM (PORCINE) 5000 UNIT/ML IJ SOLN
5000.0000 [IU] | Freq: Three times a day (TID) | INTRAMUSCULAR | Status: DC
  Administered 2022-07-04 – 2022-07-10 (×17): 5000 [IU] via SUBCUTANEOUS
  Filled 2022-07-04 (×17): qty 1

## 2022-07-04 MED ORDER — MORPHINE SULFATE (PF) 2 MG/ML IV SOLN
2.0000 mg | INTRAVENOUS | Status: DC | PRN
  Administered 2022-07-04 – 2022-07-08 (×17): 2 mg via INTRAVENOUS
  Filled 2022-07-04 (×17): qty 1

## 2022-07-04 MED ORDER — ACETAMINOPHEN 500 MG PO TABS
1000.0000 mg | ORAL_TABLET | Freq: Once | ORAL | Status: AC
  Administered 2022-07-04: 1000 mg via ORAL
  Filled 2022-07-04: qty 2

## 2022-07-04 MED ORDER — DIVALPROEX SODIUM 250 MG PO DR TAB
250.0000 mg | DELAYED_RELEASE_TABLET | Freq: Two times a day (BID) | ORAL | Status: DC
  Administered 2022-07-04 – 2022-07-10 (×12): 250 mg via ORAL
  Filled 2022-07-04 (×12): qty 1

## 2022-07-04 MED ORDER — PANTOPRAZOLE SODIUM 40 MG IV SOLR
40.0000 mg | Freq: Two times a day (BID) | INTRAVENOUS | Status: DC
  Administered 2022-07-04 – 2022-07-05 (×2): 40 mg via INTRAVENOUS
  Filled 2022-07-04 (×2): qty 10

## 2022-07-04 MED ORDER — OXYCODONE HCL 5 MG PO TABS
5.0000 mg | ORAL_TABLET | Freq: Once | ORAL | Status: AC
  Administered 2022-07-04: 5 mg via ORAL
  Filled 2022-07-04: qty 1

## 2022-07-04 MED ORDER — SODIUM CHLORIDE 0.9% FLUSH
3.0000 mL | Freq: Two times a day (BID) | INTRAVENOUS | Status: DC
  Administered 2022-07-04 – 2022-07-10 (×11): 3 mL via INTRAVENOUS

## 2022-07-04 MED ORDER — KETOROLAC TROMETHAMINE 15 MG/ML IJ SOLN
15.0000 mg | Freq: Once | INTRAMUSCULAR | Status: AC
  Administered 2022-07-04: 15 mg via INTRAVENOUS
  Filled 2022-07-04: qty 1

## 2022-07-04 MED ORDER — ACETAMINOPHEN 325 MG PO TABS
650.0000 mg | ORAL_TABLET | Freq: Four times a day (QID) | ORAL | Status: DC | PRN
  Administered 2022-07-05 – 2022-07-09 (×9): 650 mg via ORAL
  Filled 2022-07-04 (×9): qty 2

## 2022-07-04 MED ORDER — SODIUM CHLORIDE 0.9 % IV BOLUS: 1000.0000 mL | Freq: Once | INTRAVENOUS | Status: DC

## 2022-07-04 NOTE — Assessment & Plan Note (Signed)
PRN xanax continued.

## 2022-07-04 NOTE — H&P (Signed)
History and Physical     Patient: Nicole Austin WJX:[id number] DOB: [birth year] DOA: 07/04/2022 DOS: the patient was seen and examined on 07/05/2022 PCP: Danelle Berry, PA-C   Patient coming from: Home  Chief Complaint: Fall .  HISTORY OF PRESENT ILLNESS: Nicole Austin is an 62 y.o. female coming from home via EMS for Patient fell last night and she did note hitting her head but was awake on the floor running her son found her. Patient does report off-and-on intermittent dizziness at home.  On my exam is alert and awake and oriented answering questions and cooperative in the emergency room She did not have any headaches or seizures or chest pain palpitations or any other complaints overnight patient in triage was 89 to 91% O2 sats improved with oxygenation.  Past Medical History:  Diagnosis Date   ADHD (attention deficit hyperactivity disorder)    Apnea, sleep 06/30/2015   Arthritis    Asthma    Asthma with acute exacerbation 06/30/2015   Bipolar 1 disorder (HCC)    Chronic pain    COPD, moderate (HCC) 11/18/2013   Depression    DOE (dyspnea on exertion)    GERD (gastroesophageal reflux disease) 08/30/2015   Gout 08/16/2014   History of acute myocardial infarction 06/30/2015   History of panic attacks    HPV (human papilloma virus) infection 07/17/2013   Hyperlipidemia    Low HDL (under 40) 08/30/2015   MI (myocardial infarction) (HCC) 2018   MRSA infection 2023   groin abscess   Nose colonized with MRSA 03/21/2022   a.) PCR (+) prior to LEFT THA   Osteoporosis    Parkinson disease    Pre-diabetes    Prediabetes 05/20/2016   PTSD (post-traumatic stress disorder)    Restless leg syndrome    Rhabdomyolysis    Sleep apnea 06/30/2015   Stage 3 severe COPD by GOLD classification (HCC) 11/18/2013   Stroke (HCC) 2018   right arm weakness   Tremors of nervous system    hands   Uncomplicated asthma 06/30/2015   Varicella 02/25/2017   Review of Systems   Constitutional:  Positive for fatigue.  Neurological:  Positive for light-headedness.  All other systems reviewed and are negative.  Allergies  Allergen Reactions   Chantix [Varenicline] Nausea Only   Glycopyrrolate Rash    Oral irritation   Past Surgical History:  Procedure Laterality Date   BACK SURGERY     lumbar   CATARACT EXTRACTION W/PHACO Right 01/01/2019   Procedure: CATARACT EXTRACTION PHACO AND INTRAOCULAR LENS PLACEMENT (IOC) right vision blue;  Surgeon: Elliot Cousin, MD;  Location: [facility] OR[facility];  Service: Ophthalmology;  Laterality: Right;  [place] 00:39.4 CDE 5.49 Fluid Pack lot # [id number]   CATARACT EXTRACTION W/PHACO Left 01/29/2019   Procedure: CATARACT EXTRACTION PHACO AND INTRAOCULAR LENS PLACEMENT (IOC) LEFT Vision Blue;  Surgeon: Elliot Cousin, MD;  Location: [facility];  Service: Ophthalmology;  Laterality: Left;  [place] 00:51.1 CDE 4.38 Fluid Pack Lot # [id number]   COLONOSCOPY WITH PROPOFOL N/A 08/07/2017   Procedure: COLONOSCOPY WITH PROPOFOL;  Surgeon: Pasty Spillers, MD;  Location: [facility];  Service: Endoscopy;  Laterality: N/A;   HAND SURGERY Left    fractured with pins   REPLACEMENT TOTAL KNEE Left 2016   SPINAL CORD STIMULATOR INSERTION  2014   SPINAL CORD STIMULATOR REMOVAL  2014   TUBAL LIGATION     MEDICATIONS: Prior to Admission medications   Medication Sig Start Date End Date Taking? Authorizing Provider  acetaminophen (TYLENOL) 500 MG tablet Take 2 tablets (1,000 mg total) by mouth every 8 (eight) hours as needed. 02/08/22   Danelle Berry, PA-C  albuterol (VENTOLIN HFA) 108 (90 Base) MCG/ACT inhaler Inhale 2 puffs into the lungs every 4 (four) hours as needed for wheezing or shortness of breath. 11/08/21   Danelle Berry, PA-C  ALPRAZolam Prudy Feeler) 0.5 MG tablet Take 1 tablet (0.5 mg total) by mouth 2 (two) times daily. 04/10/22   Marrion Coy, MD  atorvastatin (LIPITOR) 40 MG tablet TAKE 1 TABLET BY MOUTH EVERY NIGHT AT BEDTIME 04/25/21   Margarita Mail, DO  divalproex (DEPAKOTE) 250 MG DR tablet Take 1 tablet (250 mg total) by mouth 2 (two) times daily. 11/19/16   Brandy Hale, MD  doxycycline (VIBRA-TABS) 100 MG tablet Take 1 tablet (100 mg total) by mouth 2 (two) times daily. 06/14/22   Menshew, Charlesetta Ivory, PA-C  escitalopram (LEXAPRO) 20 MG tablet Take 1 tablet (20 mg total) by mouth daily. 01/05/21 05/07/22  [place], Alvira Philips, DO  fluconazole (DIFLUCAN) 200 MG tablet Take 200 mg tablet once every 3 days for 3 doses, then take one 200 mg tablet weekly for 4 weeks. 06/20/22   Margarita Mail, DO  ibuprofen (ADVIL) 200 MG tablet Take 200 mg by mouth every 6 (six) hours as needed.    [provider]  lurasidone (LATUDA) 80 MG TABS tablet Take 1 tablet (80 mg total) by mouth daily with breakfast. 06/06/21   Caro Laroche, DO  meloxicam (MOBIC) 15 MG tablet Take 15 mg by mouth daily.    [provider]  miconazole (MICOTIN) 2 % powder Apply topically 3 (three) times daily as needed for itching. 06/20/22   Margarita Mail, DO  mupirocin ointment (BACTROBAN) 2 % Apply small about inside of both nostrils TWICE a day for the next 5 days. 05/07/22   Verlee Monte, NP  nystatin (MYCOSTATIN/NYSTOP) powder Apply 1 Application topically 2 (two) times daily. 06/14/22   Menshew, Charlesetta Ivory, PA-C  nystatin (MYCOSTATIN/NYSTOP) powder Apply 1 Application topically 3 (three) times daily. 06/20/22   Margarita Mail, DO  nystatin-triamcinolone ointment [facility]) Apply topically 2 (two) times daily as needed (to affected intact skin with yeast rash). 06/05/22   Danelle Berry, PA-C  oxyCODONE (ROXICODONE) 5 MG immediate release tablet Take 1 tablet (5 mg total) by mouth every 6 (six) hours as needed for severe pain. 06/20/22   Henderly, Britni A, PA-C  OXYGEN Inhale 2 L into the lungs as needed.    [provider]  rOPINIRole (REQUIP XL) 4 MG 24 hr tablet Take 4 mg by mouth at bedtime.    [provider]   senna-docusate (SENOKOT-S) 8.6-50 MG tablet Take 1 tablet by mouth 2 (two) times daily. 04/10/22   Marrion Coy, MD  Skin Protectants, Misc. (INTERDRY [id number]") SHEE Cut piece of fabric to fit area of skin affected and apply to skin for for up to 5 days, replace sooner if soiled 06/14/22   Danelle Berry, PA-C  Tiotropium Bromide Monohydrate (SPIRIVA RESPIMAT) 2.5 MCG/ACT AERS Inhale 2 puffs into the lungs daily. 11/21/21   Sharyn Creamer, MD    ALPRAZolam  0.5 mg Oral BID   divalproex  250 mg Oral BID   escitalopram  20 mg Oral Daily   heparin  5,000 Units Subcutaneous Q8H   pantoprazole (PROTONIX) IV  40 mg Intravenous Q12H   sodium chloride flush  3 mL Intravenous Q12H     lactated ringers Stopped (  07/04/22 1857)   sodium chloride     ED Course: Pt in Ed alert awake oriented afebrile O2 sats of 93%. Patient requesting her Xanax as she is anxious. Vitals:   07/04/22 1754 07/04/22 1945 07/04/22 2041 07/05/22 0009  BP: (!) 152/66 (!) 161/76 (!) 124/56 (!) 100/50  Pulse: 71 72 82 65  Resp: 13 16 20 18   Temp:  98.5 F (36.9 C) 98.2 F (36.8 C) 97.9 F (36.6 C)  TempSrc:  Oral    SpO2: 97% 98% 93% 95%  Weight:      Height:       No intake/output data recorded. SpO2: 95 % Blood work in ed shows: CMP shows creatinine of 1.07 patient has mild intermittent AKI. Patient CPK is 3039. Lactic within normal limits. CBC shows normal white count hemoglobin 11.4 normal platelet count. normal thyroid testing.  Home meds reviewed patient requesting Xanax and takes Depakote for mood.  No history of seizures.  Patient continued on LR at 75 cc/h. CT C-spine is negative for any acute abnormality as is head CT noncontrast CT lumbar as well shows mild to moderate degenerative changes but no acute fractures and CT thoracic spine as well as shows no acute fractures but degenerative changes. Results for orders placed or performed during the hospital encounter of 07/04/22 (from the past 72 hour(s))  Basic  metabolic panel     Status: Abnormal   Collection Time: 07/04/22  4:04 PM  Result Value Ref Range   Sodium 135 135 - 145 mmol/L   Potassium 3.6 3.5 - 5.1 mmol/L   Chloride 96 (L) 98 - 111 mmol/L   CO2 29 22 - 32 mmol/L   Glucose, Bld 81 70 - 99 mg/dL    Comment: Glucose reference range applies only to samples taken after fasting for at least 8 hours.   BUN 14 8 - 23 mg/dL   Creatinine, Ser 1.61 (H) 0.44 - 1.00 mg/dL   Calcium 9.2 8.9 - 09.6 mg/dL   GFR, Estimated 58 (L) >60 mL/min    Comment: (NOTE) Calculated using the CKD-EPI Creatinine Equation (2021)    Anion gap 10 5 - 15    Comment: Performed at [facility], [address] [place], [place] [address]  CBC     Status: Abnormal   Collection Time: 07/04/22  4:04 PM  Result Value Ref Range   WBC 7.5 4.0 - 10.5 K/uL   RBC 3.72 (L) 3.87 - 5.11 MIL/uL   Hemoglobin 11.4 (L) 12.0 - 15.0 g/dL   HCT 98.1 (L) 19.1 - 47.8 %   MCV 96.5 80.0 - 100.0 fL   MCH 30.6 26.0 - 34.0 pg   MCHC 31.8 30.0 - 36.0 g/dL   RDW 29.5 62.1 - 30.8 %   Platelets 252 150 - 400 K/uL   nRBC 0.0 0.0 - 0.2 %    Comment: Performed at [facility], [address] [place], [place] [address]  CK     Status: Abnormal   Collection Time: 07/04/22  4:04 PM  Result Value Ref Range   Total CK 3,039 (H) 38 - 234 U/L    Comment: Performed at [facility], [address] [place], [place] [address]  Lactic acid, plasma     Status: None   Collection Time: 07/04/22  4:04 PM  Result Value Ref Range   Lactic Acid, Venous 1.1 0.5 - 1.9 mmol/L    Comment: Performed at [facility], [address]  [place], [place] [address]  Hepatic function panel     Status: Abnormal   Collection Time: 07/04/22  4:04 PM  Result Value Ref Range   Total Protein 6.4 (L) 6.5 - 8.1 g/dL   Albumin 3.3 (L) 3.5 - 5.0 g/dL   AST 71 (H) 15 - 41 U/L   ALT 22 0 - 44 U/L   Alkaline Phosphatase 59 38 - 126 U/L   Total Bilirubin 0.5 0.3 - 1.2 mg/dL    Bilirubin, Direct <1.9 0.0 - 0.2 mg/dL   Indirect Bilirubin NOT CALCULATED 0.3 - 0.9 mg/dL    Comment: Performed at [facility], [address] [place], [place] [address]  Lactic acid, plasma     Status: None   Collection Time: 07/04/22  7:47 PM  Result Value Ref Range   Lactic Acid, Venous 0.8 0.5 - 1.9 mmol/L    Comment: Performed at [facility], [address] [place], [place] [address]  TSH     Status: None   Collection Time: 07/04/22  7:47 PM  Result Value Ref Range   TSH 0.759 0.350 - 4.500 uIU/mL    Comment: Performed by a 3rd Generation assay with a functional sensitivity of <=0.01 uIU/mL. Performed at [facility], [address] [place], [place] [address]   T4, free     Status: None   Collection Time: 07/04/22  7:47 PM  Result Value Ref Range   Free T4 0.81 0.61 - 1.12 ng/dL    Comment: (NOTE) Biotin ingestion may interfere with free T4 tests. If the results are inconsistent with the TSH level, previous test results, or the clinical presentation, then consider biotin interference. If needed, order repeat testing after stopping biotin. Performed at [facility], [address] [place], [place] [address]     Lab Results  Component Value Date   CREATININE 1.07 (H) 07/04/2022   CREATININE 1.37 (H) 06/20/2022   CREATININE 1.11 (H) 06/14/2022      Latest Ref Rng & Units 07/04/2022    4:04 PM 06/20/2022    4:58 PM 06/14/2022    3:08 PM  CMP  Glucose 70 - 99 mg/dL 81  86  629   BUN 8 - 23 mg/dL 14  25  19    Creatinine 0.44 - 1.00 mg/dL 5.28  4.13  2.44   Sodium 135 - 145 mmol/L 135  128  126   Potassium 3.5 - 5.1 mmol/L 3.6  4.7  4.7   Chloride 98 - 111 mmol/L 96  93  92   CO2 22 - 32 mmol/L 29  26  26    Calcium 8.9 - 10.3 mg/dL 9.2  9.1  8.8   Total Protein 6.5 - 8.1 g/dL 6.4  6.6    Total Bilirubin 0.3 - 1.2 mg/dL 0.5  0.4    Alkaline Phos 38 - 126 U/L 59  60    AST 15 - 41 U/L 71  14    ALT 0 - 44 U/L 22  10      Unresulted Labs (From admission, onward)     Start     Ordered   07/04/22 1736  Urinalysis, w/ Reflex to Culture (Infection Suspected) -Urine, Clean Catch  Once,   URGENT       Question:  Specimen Source  Answer:  Urine, Clean Catch   07/04/22 1735           Pt has received : Orders Placed This Encounter  Procedures  CT HEAD WO CONTRAST    Place this order only IF head trauma is known/suspected and pt is taking anticoagulant.    Standing Status:   Standing    Number of Occurrences:   1    Order Specific Question:   Radiology Contrast Protocol - do NOT remove file path    Answer:   [email or web address]   DG Shoulder Left    Standing Status:   Standing    Number of Occurrences:   1    Order Specific Question:   Reason for Exam (SYMPTOM  OR DIAGNOSIS REQUIRED)    Answer:   fall   DG Chest Port 1 View    Standing Status:   Standing    Number of Occurrences:   1    Order Specific Question:   Reason for Exam (SYMPTOM  OR DIAGNOSIS REQUIRED)    Answer:   fall   DG Pelvis 1-2 Views    Standing Status:   Standing    Number of Occurrences:   1    Order Specific Question:   Symptom/Reason for Exam    Answer:   Fall [[id number]]    Order Specific Question:   Call Results- Best Contact Number?    Answer:   fall   CT Cervical Spine Wo Contrast    Standing Status:   Standing    Number of Occurrences:   1   CT Thoracic Spine Wo Contrast    Standing Status:   Standing    Number of Occurrences:   1   CT Lumbar Spine Wo Contrast    Standing Status:   Standing    Number of Occurrences:   1   Basic metabolic panel    Standing Status:   Standing    Number of Occurrences:   1   CBC    Standing Status:   Standing    Number of Occurrences:   1   CK    Standing Status:   Standing    Number of Occurrences:   1   Lactic acid, plasma    Standing Status:   Standing    Number of Occurrences:   2   Hepatic function panel    Standing Status:   Standing     Number of Occurrences:   1   Urinalysis, w/ Reflex to Culture (Infection Suspected) -Urine, Clean Catch    Standing Status:   Standing    Number of Occurrences:   1    Order Specific Question:   Specimen Source    Answer:   Urine, Clean Catch [76]   TSH    Standing Status:   Standing    Number of Occurrences:   1    Order Specific Question:   Specimen collection method    Answer:   IV Team=IV Team collect   T4, free    Standing Status:   Standing    Number of Occurrences:   1    Order Specific Question:   Specimen collection method    Answer:   IV Team=IV Team collect   Document Height and Actual Weight    Use scales to weigh patient, not stated or estimated weight.    Standing Status:   Standing    Number of Occurrences:   1   Cardiac Monitoring Continuous x 24 hours Indications for use: Other; other indications for use: Rhabdomyolysis    Standing Status:   Standing    Number of Occurrences:   1  Order Specific Question:   Indications for use:    Answer:   Other    Order Specific Question:   other indications for use:    Answer:   Rhabdomyolysis   Maintain IV access    Standing Status:   Standing    Number of Occurrences:   1   Vital signs    Standing Status:   Standing    Number of Occurrences:   1   Notify physician (specify)    Standing Status:   Standing    Number of Occurrences:   20    Order Specific Question:   Notify Physician    Answer:   for pulse less than 55 or greater than 120    Order Specific Question:   Notify Physician    Answer:   for respiratory rate less than 12 or greater than 25    Order Specific Question:   Notify Physician    Answer:   for temperature greater than 100.5 F    Order Specific Question:   Notify Physician    Answer:   for urinary output less than 30 mL/hr for four hours    Order Specific Question:   Notify Physician    Answer:   for systolic BP less than 90 or greater than 160, diastolic BP less than 60 or greater than 100     Order Specific Question:   Notify Physician    Answer:   for new hypoxia w/ oxygen saturations < 88%   Progressive Mobility Protocol: No Restrictions    Standing Status:   Standing    Number of Occurrences:   1   Do not place and if present remove PureWick    Standing Status:   Standing    Number of Occurrences:   1   Initiate Oral Care Protocol    Standing Status:   Standing    Number of Occurrences:   1   Initiate Carrier Fluid Protocol    Standing Status:   Standing    Number of Occurrences:   1   RN may order General Admission PRN Orders utilizing "General Admission PRN medications" (through manage orders) for the following patient needs: allergy symptoms (Claritin), cold sores (Carmex), cough (Robitussin DM), eye irritation (Liquifilm Tears), hemorrhoids (Tucks), indigestion (Maalox), minor skin irritation (Hydrocortisone Cream), muscle pain (Ben Gay), nose irritation (saline nasal spray) and sore throat (Chloraseptic spray).    Standing Status:   Standing    Number of Occurrences:   [phone]   Swallow screen    Standing Status:   Standing    Number of Occurrences:   1   Intake and Output    Standing Status:   Standing    Number of Occurrences:   1   Orthostatic vital signs    Standing Status:   Standing    Number of Occurrences:   1   Full code    Standing Status:   Standing    Number of Occurrences:   1    Order Specific Question:   By:    Answer:   Other   Consult to hospitalist    Standing Status:   Standing    Number of Occurrences:   1    Order Specific Question:   Place call to:    Answer:   [id number]    Order Specific Question:   Reason for Consult    Answer:   Admit   Pulse oximetry check with vital signs  Standing Status:   Standing    Number of Occurrences:   1   Oxygen therapy Mode or (Route): Nasal cannula; Liters Per Minute: 2; Keep 02 saturation: greater than 92 %    Standing Status:   Standing    Number of Occurrences:   1    Order Specific Question:    Mode or (Route)    Answer:   Nasal cannula    Order Specific Question:   Liters Per Minute    Answer:   2    Order Specific Question:   Keep 02 saturation    Answer:   greater than 92 %   CBG monitoring, ED    Standing Status:   Standing    Number of Occurrences:   1   ED EKG    Altered mental status    Standing Status:   Standing    Number of Occurrences:   1    Order Specific Question:   Reason for Exam    Answer:   Other (See Comments)   EKG 12-Lead    Standing Status:   Standing    Number of Occurrences:   1   Admit to Inpatient (patient's expected length of stay will be greater than 2 midnights or inpatient only procedure)    Standing Status:   Standing    Number of Occurrences:   1    Order Specific Question:   Hospital Area    Answer:   [facility] [[id number]]    Order Specific Question:   Level of Care    Answer:   Telemetry Medical [104]    Order Specific Question:   Covid Evaluation    Answer:   Asymptomatic - no recent exposure (last 10 days) testing not required    Order Specific Question:   Diagnosis    Answer:   Fall [290176]    Order Specific Question:   Admitting Physician    Answer:   Darrold Junker    Order Specific Question:   Attending Physician    Answer:   Darrold Junker    Order Specific Question:   Certification:    Answer:   I certify this patient will need inpatient services for at least 2 midnights    Order Specific Question:   Estimated Length of Stay    Answer:   3   Aspiration precautions    Standing Status:   Standing    Number of Occurrences:   1   Fall precautions    Standing Status:   Standing    Number of Occurrences:   1    Meds ordered this encounter  Medications   acetaminophen (TYLENOL) tablet 1,000 mg   oxyCODONE (Oxy IR/ROXICODONE) immediate release tablet 5 mg   ketorolac (TORADOL) 15 MG/ML injection 15 mg   sodium chloride 0.9 % bolus 1,000 mL   divalproex (DEPAKOTE) DR tablet 250 mg    sodium chloride flush (NS) 0.9 % injection 3 mL   lactated ringers infusion   OR Linked Order Group    acetaminophen (TYLENOL) tablet 650 mg    acetaminophen (TYLENOL) suppository 650 mg   heparin injection 5,000 Units   pantoprazole (PROTONIX) injection 40 mg   morphine (PF) 2 MG/ML injection 2 mg   escitalopram (LEXAPRO) tablet 20 mg   ALPRAZolam (XANAX) tablet 0.5 mg    Admission Imaging : CT Thoracic Spine Wo Contrast  Result Date: 07/04/2022 CLINICAL DATA:  Back trauma, no prior  imaging (Age >= 16y) EXAM: CT THORACIC AND LUMBAR SPINE WITHOUT CONTRAST TECHNIQUE: Multidetector CT imaging of the thoracic and lumbar spine was performed without contrast. Multiplanar CT image reconstructions were also generated. RADIATION DOSE REDUCTION: This exam was performed according to the departmental dose-optimization program which includes automated exposure control, adjustment of the mA and/or kV according to patient size and/or use of iterative reconstruction technique. COMPARISON:  CT lumbar spine 06/20/2022, CT chest 05/19/2016 FINDINGS: CT THORACIC SPINE FINDINGS Alignment: Normal. Vertebrae: There is unchanged chronic anterior height loss of T9 and mild anterior wedging of T8. There is a superior endplate Schmorl's node of T9 which is also unchanged. Unchanged mild superior endplate depression of T12. There is no evidence of acute thoracic spine fracture. Paraspinal and other soft tissues: Paraseptal emphysema in the lung apices. Aortic atherosclerosis. Disc levels: There is mild to moderate multilevel degenerative disc disease worst in the midthoracic spine at T7-T8 and T9-T10. Mild-to-moderate multilevel facet arthropathy worst in the upper thoracic spine. No visible impingement. CT LUMBAR SPINE FINDINGS Segmentation: 5 lumbar type vertebrae. Alignment: Slight levoconvex curvature.  No significant listhesis. Vertebrae: No evidence of acute lumbar spine fracture. Unchanged mild degenerative superior  endplate irregularity of L1. Paraspinal and other soft tissues: Aortoiliac atherosclerosis. No other significant findings. Disc levels: Mild-to-moderate degenerative disc disease at L3-L4 and L4-L5, with mild disc bulging and bilateral facet arthropathy resulting in mild spinal canal narrowing and neural foraminal narrowing. No visible impingement. Findings are similar to recent CT. IMPRESSION: No evidence of acute fracture in the thoracic or lumbar spine. Mild to moderate multilevel degenerative changes, worst at T7-T8, T9-T10, L3-L4, and L4-L5. Electronically Signed   By: Caprice Renshaw M.D.   On: 07/04/2022 17:19   CT Lumbar Spine Wo Contrast  Result Date: 07/04/2022 CLINICAL DATA:  Back trauma, no prior imaging (Age >= 16y) EXAM: CT THORACIC AND LUMBAR SPINE WITHOUT CONTRAST TECHNIQUE: Multidetector CT imaging of the thoracic and lumbar spine was performed without contrast. Multiplanar CT image reconstructions were also generated. RADIATION DOSE REDUCTION: This exam was performed according to the departmental dose-optimization program which includes automated exposure control, adjustment of the mA and/or kV according to patient size and/or use of iterative reconstruction technique. COMPARISON:  CT lumbar spine 06/20/2022, CT chest 05/19/2016 FINDINGS: CT THORACIC SPINE FINDINGS Alignment: Normal. Vertebrae: There is unchanged chronic anterior height loss of T9 and mild anterior wedging of T8. There is a superior endplate Schmorl's node of T9 which is also unchanged. Unchanged mild superior endplate depression of T12. There is no evidence of acute thoracic spine fracture. Paraspinal and other soft tissues: Paraseptal emphysema in the lung apices. Aortic atherosclerosis. Disc levels: There is mild to moderate multilevel degenerative disc disease worst in the midthoracic spine at T7-T8 and T9-T10. Mild-to-moderate multilevel facet arthropathy worst in the upper thoracic spine. No visible impingement. CT LUMBAR  SPINE FINDINGS Segmentation: 5 lumbar type vertebrae. Alignment: Slight levoconvex curvature.  No significant listhesis. Vertebrae: No evidence of acute lumbar spine fracture. Unchanged mild degenerative superior endplate irregularity of L1. Paraspinal and other soft tissues: Aortoiliac atherosclerosis. No other significant findings. Disc levels: Mild-to-moderate degenerative disc disease at L3-L4 and L4-L5, with mild disc bulging and bilateral facet arthropathy resulting in mild spinal canal narrowing and neural foraminal narrowing. No visible impingement. Findings are similar to recent CT. IMPRESSION: No evidence of acute fracture in the thoracic or lumbar spine. Mild to moderate multilevel degenerative changes, worst at T7-T8, T9-T10, L3-L4, and L4-L5. Electronically Signed   By: Gerilyn Pilgrim  Park Breed M.D.   On: 07/04/2022 17:19   CT Cervical Spine Wo Contrast  Result Date: 07/04/2022 CLINICAL DATA:  Neck trauma, intoxicated or obtunded (Age >= 15y) EXAM: CT CERVICAL SPINE WITHOUT CONTRAST TECHNIQUE: Multidetector CT imaging of the cervical spine was performed without intravenous contrast. Multiplanar CT image reconstructions were also generated. RADIATION DOSE REDUCTION: This exam was performed according to the departmental dose-optimization program which includes automated exposure control, adjustment of the mA and/or kV according to patient size and/or use of iterative reconstruction technique. COMPARISON:  CT cervical spine 07/02/2014. FINDINGS: Alignment: None Skull base and vertebrae: There is no evidence of acute cervical spine fracture. Soft tissues and spinal canal: Negative. Disc levels: There is multilevel degenerative disc disease, moderate-severe at C4-C5 and C5-C6 with posterior disc osteophyte complexes, and a suspected left paracentral disc protrusion at C5-C6. progressive multilevel facet arthropathy, worst at C6-C7 and C7-T1 on the right. Upper chest: Paraseptal emphysema in the lung apices. Other:  None IMPRESSION: No evidence of acute cervical spine fracture. Multilevel degenerative disc disease, moderate-severe at C4-C5 and C5-C6. Suspected left paracentral disc protrusion at C5-C6. Progressive multilevel facet arthropathy worst at C6-C7 and C7-T1 on the right. Electronically Signed   By: Caprice Renshaw M.D.   On: 07/04/2022 17:09   DG Shoulder Left  Result Date: 07/04/2022 CLINICAL DATA:  Fall EXAM: LEFT SHOULDER - 2+ VIEW COMPARISON:  None Available. FINDINGS: There is no evidence of acute fracture. Alignment is normal. Mild glenohumeral moderate AC joint osteoarthritis. Soft tissues appear unremarkable radiographically. IMPRESSION: No acute fracture or dislocation. Mild glenohumeral and moderate AC joint osteoarthritis. Electronically Signed   By: Caprice Renshaw M.D.   On: 07/04/2022 16:57   DG Pelvis 1-2 Views  Result Date: 07/04/2022 CLINICAL DATA:  Fall EXAM: PELVIS - 1-2 VIEW COMPARISON:  CT 06/20/2022 FINDINGS: Avascular necrosis of the femoral heads with left femoral head collapse and severe left hip osteoarthritis, similar to prior CT. Mild right hip osteoarthritis. No evidence of acute fracture. IMPRESSION: No evidence of acute fracture. 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 Electronically Signed   By: Caprice Renshaw M.D.   On: 07/04/2022 16:55   DG Chest Port 1 View  Result Date: 07/04/2022 CLINICAL DATA:  Fall out of bed with left shoulder and back pain EXAM: PORTABLE CHEST 1 VIEW COMPARISON:  Chest radiograph dated 04/09/2022 FINDINGS: Normal lung volumes. No focal consolidations. No pleural effusion or pneumothorax. The heart size and mediastinal contours are within normal limits. No acute osseous abnormality. IMPRESSION: No radiographic finding of acute displaced fracture. Electronically Signed   By: Agustin Cree M.D.   On: 07/04/2022 16:55   CT HEAD WO CONTRAST  Result Date:  07/04/2022 CLINICAL DATA:  Head trauma, abnormal mental status (Age 81-64y) EXAM: CT HEAD WITHOUT CONTRAST TECHNIQUE: Contiguous axial images were obtained from the base of the skull through the vertex without intravenous contrast. RADIATION DOSE REDUCTION: This exam was performed according to the departmental dose-optimization program which includes automated exposure control, adjustment of the mA and/or kV according to patient size and/or use of iterative reconstruction technique. COMPARISON:  Head CT 03/25/2015 brain MRI 07/17/2018 FINDINGS: Brain: No intracranial hemorrhage, mass effect, or midline shift. Mild generalized atrophy, normal for age. No hydrocephalus. The basilar cisterns are patent. No evidence of territorial infarct or acute ischemia. No extra-axial or intracranial fluid collection. Vascular: No hyperdense vessel or unexpected calcification. Skull: No fracture or focal lesion.  Sinuses/Orbits: Paranasal sinuses and mastoid air cells are clear. The visualized orbits are unremarkable. Bilateral cataract resection. Other: Right supraorbital dermal piercing. IMPRESSION: No acute intracranial abnormality. No skull fracture. Electronically Signed   By: Narda Rutherford M.D.   On: 07/04/2022 16:04   Physical Examination: Vitals:   07/04/22 1754 07/04/22 1945 07/04/22 2041 07/05/22 0009  BP: (!) 152/66 (!) 161/76 (!) 124/56 (!) 100/50  Pulse: 71 72 82 65  Temp:  98.5 F (36.9 C) 98.2 F (36.8 C) 97.9 F (36.6 C)  Resp: 13 16 20 18   Height:      Weight:      SpO2: 97% 98% 93% 95%  TempSrc:  Oral    BMI (Calculated):       Physical Exam Vitals and nursing note reviewed.  Constitutional:      General: She is not in acute distress.    Appearance: Normal appearance. She is not ill-appearing, toxic-appearing or diaphoretic.  HENT:     Head: Normocephalic and atraumatic.     Right Ear: Hearing and external ear normal.     Left Ear: Hearing and external ear normal.     Nose: Nose normal.  No nasal deformity.     Mouth/Throat:     Lips: Pink.     Mouth: Mucous membranes are moist.     Tongue: No lesions.     Pharynx: Oropharynx is clear.  Eyes:     Extraocular Movements: Extraocular movements intact.     Pupils: Pupils are equal, round, and reactive to light.  Neck:     Vascular: No carotid bruit.  Cardiovascular:     Rate and Rhythm: Normal rate and regular rhythm.     Pulses: Normal pulses.     Heart sounds: Normal heart sounds.  Pulmonary:     Effort: Pulmonary effort is normal.     Breath sounds: Normal breath sounds.  Abdominal:     General: Bowel sounds are normal. There is no distension.     Palpations: Abdomen is soft. There is no mass.     Tenderness: There is no abdominal tenderness. There is no guarding.     Hernia: No hernia is present.  Musculoskeletal:     Right lower leg: No edema.     Left lower leg: No edema.  Skin:    General: Skin is warm.  Neurological:     General: No focal deficit present.     Mental Status: She is alert and oriented to person, place, and time.     Cranial Nerves: Cranial nerves 2-12 are intact.     Motor: Motor function is intact.  Psychiatric:        Attention and Perception: Attention normal.        Mood and Affect: Mood normal.        Speech: Speech normal.        Behavior: Behavior normal. Behavior is cooperative.        Cognition and Memory: Cognition normal.     Assessment and Plan: * Traumatic rhabdomyolysis (HCC) Pt fell yesterday and was on the floor overnight.  She was awake she did not pass out.  This morning when her son came bringing her grocery in the house he brought her to the hospital as she was having pain all over.  In ed pt found to have rhabdo and elevated CPK and low bp. Admission requested for same we will cont with cautious hydration overnight.    COPD (chronic obstructive pulmonary disease) (  HCC) Prn albuterol. Scheduled spiriva. Pt no longer smokes.   Bipolar disorder with  depression (HCC) PRN xanax continued.    Fall Gait strengthening.  STR or SNF with agressive PT. Fall precaution. Orthostatic BP eval.   GERD (gastroesophageal reflux disease) IV PPI.    DVT prophylaxis:  Heparin Code Status:  Full code    07/04/2022    9:00 PM  Advanced Directives  Would patient like information on creating a medical advance directive? No - Patient declined    Family Communication:  None Emergency Contact: Contact Information     Name Relation Home Work [place] Sister [phone]  [phone]       Disposition Plan:  Home Consults: None Admission status: Observe patient Unit / Expected LOS: Med/tele/1-2   Gertha Calkin MD [facility]  6 PM- 2 AM. [phone]( Pager )  For questions regarding this patient please use [email or web address] to contact the current [facility] MD.   Bonita Quin may also call [phone] to contact current Assigned [facility] Attending/Consulting MD for this patient.

## 2022-07-04 NOTE — ED Provider Notes (Addendum)
[facility] Provider Note    Event Date/Time   First MD Initiated Contact with Patient 07/04/22 1545     (approximate)   History   Fall, Back Pain, and Neck Pain   HPI  Nicole Austin is a 62 y.o. female   Past medical history of bipolar, depression, ADHD, CAD, asthma, Parkinson's, hyperlipidemia, sleep apnea, chronic pain, osteoporosis, and polysubstance use who presents to the emergency department with a fall out of bed.  She was sleeping when she rolled out of her bed and struck her head.  This was in the middle of night she was able to get up until this morning when somebody found her.  She reports no other acute medical complaints and no recent illnesses.  She feels that her pain is throughout the entirety of her body and notes that she has chronic pain but is worsened now.     External Medical Documents Reviewed: Emergency department visit dated 06/20/2022 where she was seen for hip and leg pain with a recent fall with negative workup and able to ambulate in the ED and was thus discharged at that time.      Physical Exam   Triage Vital Signs: ED Triage Vitals  Enc Vitals Group     BP 07/04/22 1521 (!) 148/78     Pulse Rate 07/04/22 1521 73     Resp 07/04/22 1521 16     Temp 07/04/22 1521 98.6 F (37 C)     Temp Source 07/04/22 1615 Oral     SpO2 07/04/22 1524 98 %     Weight 07/04/22 1523 230 lb (104.3 kg)     Height 07/04/22 1523 5\' 1"  (1.549 m)     Head Circumference --      Peak Flow --      Pain Score 07/04/22 1523 10     Pain Loc --      Pain Edu? --      Excl. in GC? --     Most recent vital signs: Vitals:   07/04/22 1620 07/04/22 1754  BP:  (!) 152/66  Pulse:  71  Resp:  13  Temp:    SpO2: 95% 97%    General: Awake, no distress.  CV:  Good peripheral perfusion.  Resp:  Normal effort.  Abd:  No distention.  Other:  Awake with normal hemodynamics.  She is ranging all extremities though winces with pain when doing  so.  She has reported bony tenderness everywhere I press pacifically around the head, C-spine, bilateral shoulders, bilateral hips and T and L-spine.  Her abdomen however remains soft and nontender.  ED Results / Procedures / Treatments   Labs (all labs ordered are listed, but only abnormal results are displayed) Labs Reviewed  BASIC METABOLIC PANEL - Abnormal; Notable for the following components:      Result Value   Chloride 96 (*)    Creatinine, Ser 1.07 (*)    GFR, Estimated 58 (*)    All other components within normal limits  CBC - Abnormal; Notable for the following components:   RBC 3.72 (*)    Hemoglobin 11.4 (*)    HCT 35.9 (*)    All other components within normal limits  CK - Abnormal; Notable for the following components:   Total CK 3,039 (*)    All other components within normal limits  HEPATIC FUNCTION PANEL - Abnormal; Notable for the following components:   Total Protein 6.4 (*)  Albumin 3.3 (*)    AST 71 (*)    All other components within normal limits  LACTIC ACID, PLASMA  LACTIC ACID, PLASMA  URINALYSIS, W/ REFLEX TO CULTURE (INFECTION SUSPECTED)  CBG MONITORING, ED     I ordered and reviewed the above labs they are notable for creatinine of 1.07 and H&H 11/35 both at baseline.  EKG  ED ECG REPORT I, Pilar Jarvis, the attending physician, personally viewed and interpreted this ECG.   Date: 07/04/2022  EKG Time: 1530  Rate: 72  Rhythm: nsr  Axis: nl  Intervals:none  ST&T Change: no stemi    RADIOLOGY I independently reviewed and interpreted CT scan of the head see no obvious bleeding or midline shift   PROCEDURES:  Critical Care performed: No  Procedures   MEDICATIONS ORDERED IN ED: Medications  sodium chloride 0.9 % bolus 1,000 mL (has no administration in time range)  acetaminophen (TYLENOL) tablet 1,000 mg (1,000 mg Oral Given 07/04/22 1757)  oxyCODONE (Oxy IR/ROXICODONE) immediate release tablet 5 mg (5 mg Oral Given 07/04/22 1758)   ketorolac (TORADOL) 15 MG/ML injection 15 mg (15 mg Intravenous Given 07/04/22 1759)    IMPRESSION / MDM / ASSESSMENT AND PLAN / ED COURSE  I reviewed the triage vital signs and the nursing notes.                                Patient's presentation is most consistent with acute presentation with potential threat to life or bodily function.  Differential diagnosis includes, but is not limited to, blunt traumatic injury including intracranial bleeding, C-spine fracture dislocation, fracture or compression fracture or dislocation of the T or L-spine, hip fracture or dislocation.  Rhabdomyolysis   The patient is on the cardiac monitor to evaluate for evidence of arrhythmia and/or significant heart rate changes.  MDM: This is a patient with a roll out of bed chronic pain worsening pain and imaging was ordered to assess for acute traumatic injuries to her areas of pain.   -- Imaging is negative for acute traumatic injuries.  Will give pain medications and reassess.    Markedly elevated CK, will give IV fluid bolus for rhabdomyolysis and admit to the hospitalist service.      FINAL CLINICAL IMPRESSION(S) / ED DIAGNOSES   Final diagnoses:  Fall, initial encounter  Acute bilateral low back pain without sciatica  Cervical strain, acute, initial encounter  Minor head injury, initial encounter  Traumatic rhabdomyolysis, initial encounter [facility])     Rx / DC Orders   ED Discharge Orders     None        Note:  This document was prepared using Dragon voice recognition software and may include unintentional dictation errors.    Pilar Jarvis, MD 07/04/22 Aldona Lento    Pilar Jarvis, MD 07/04/22 [phone]

## 2022-07-04 NOTE — ED Notes (Signed)
First Nurse Note: Patient to ED via [facility] from home for a fall. Patient fell sometime during the night and was helped up this AM by family. C/o neck and left shoulder pain. VS WNL with EMS.

## 2022-07-04 NOTE — ED Triage Notes (Signed)
Pt to ED via [facility]. Pt states that she fell out of the bed last night. Pt reports pain in her head, neck, and back. Pt is in NAD.

## 2022-07-04 NOTE — ED Notes (Signed)
Pt states she is not able to ambulate due to her being on the floor all night last night and developing rhabdomyolysis. Pt appreciative for purewick.

## 2022-07-04 NOTE — Telephone Encounter (Signed)
Copied from CRM [id number]. Topic: General - Other >> Jul 03, 2022  4:15 PM [name] wrote: Reason for CRM: Darral Dash with [employer] has called to request orders for a tub bench with suction cups   Please contact the patient at the number on file further if needed

## 2022-07-04 NOTE — ED Notes (Signed)
Hospitalist present at the bedside

## 2022-07-04 NOTE — Discharge Instructions (Signed)
Take your home pain medications to help with your pain related to your injury.  Fortunately your imaging today did not show any emergency findings from your fall out of bed today.  Follow-up with your primary doctor for a checkup this week.  If you experience any new, worsening, or unexpected symptoms come back to the emergency department for recheck.

## 2022-07-04 NOTE — ED Notes (Addendum)
Multiple attempts to get IV and blood in patient by this RN and other RN and unsuccessful. IV team consult put in.

## 2022-07-04 NOTE — Assessment & Plan Note (Addendum)
PT and OT recommending rehab.  Patient agreeable to rehab.

## 2022-07-04 NOTE — ED Notes (Signed)
IV team at bedside 

## 2022-07-04 NOTE — ED Notes (Signed)
Pt initial SpO2 in triage 89-91%. Pt appears sluggish in responses. Pt reports that she was in the floor for a while until her brother came in and found her this morning around 1030. Pt denies taking pain medication other than OTC pain meds. Pts SpO2 improved without O2 while patient talking.

## 2022-07-04 NOTE — Assessment & Plan Note (Signed)
Pt has pain in both hips and has large Hematoma overlying left hip. Orthostatic BP when pt is stable prior to discharge to rule out syncope

## 2022-07-04 NOTE — Assessment & Plan Note (Addendum)
Slight wheeze.  Add budesonide and albuterol nebulizers, continue Spiriva.

## 2022-07-05 DIAGNOSIS — M25562 Pain in left knee: Secondary | ICD-10-CM

## 2022-07-05 DIAGNOSIS — T796XXS Traumatic ischemia of muscle, sequela: Secondary | ICD-10-CM | POA: Diagnosis not present

## 2022-07-05 DIAGNOSIS — J449 Chronic obstructive pulmonary disease, unspecified: Secondary | ICD-10-CM

## 2022-07-05 DIAGNOSIS — F319 Bipolar disorder, unspecified: Secondary | ICD-10-CM | POA: Diagnosis not present

## 2022-07-05 DIAGNOSIS — W19XXXS Unspecified fall, sequela: Secondary | ICD-10-CM

## 2022-07-05 LAB — BASIC METABOLIC PANEL
Anion gap: 7 (ref 5–15)
BUN: 15 mg/dL (ref 8–23)
CO2: 28 mmol/L (ref 22–32)
Calcium: 8.4 mg/dL — ABNORMAL LOW (ref 8.9–10.3)
Chloride: 101 mmol/L (ref 98–111)
Creatinine, Ser: 0.86 mg/dL (ref 0.44–1.00)
GFR, Estimated: >60 mL/min (ref >60)
Glucose, Bld: 87 mg/dL (ref 70–99)
Potassium: 3.7 mmol/L (ref 3.5–5.1)
Sodium: 136 mmol/L (ref 135–145)

## 2022-07-05 LAB — CK: Total CK: 1873 U/L — ABNORMAL HIGH (ref 38–234)

## 2022-07-05 MED ORDER — NYSTATIN 100000 UNIT/GM EX POWD
Freq: Three times a day (TID) | CUTANEOUS | Status: DC
  Filled 2022-07-05: qty 15

## 2022-07-05 MED ORDER — SODIUM CHLORIDE 0.9 % IV BOLUS
500.0000 mL | Freq: Once | INTRAVENOUS | Status: AC
  Administered 2022-07-05: 500 mL via INTRAVENOUS

## 2022-07-05 MED ORDER — CYCLOBENZAPRINE HCL 10 MG PO TABS
5.0000 mg | ORAL_TABLET | Freq: Three times a day (TID) | ORAL | Status: DC | PRN
  Administered 2022-07-05 – 2022-07-09 (×10): 5 mg via ORAL
  Filled 2022-07-05 (×10): qty 1

## 2022-07-05 MED ORDER — LURASIDONE HCL 40 MG PO TABS
60.0000 mg | ORAL_TABLET | Freq: Every day | ORAL | Status: DC
  Administered 2022-07-05 – 2022-07-10 (×6): 60 mg via ORAL
  Filled 2022-07-05 (×6): qty 1

## 2022-07-05 MED ORDER — ROPINIROLE HCL 1 MG PO TABS
4.0000 mg | ORAL_TABLET | Freq: Every day | ORAL | Status: DC
  Administered 2022-07-05 – 2022-07-09 (×5): 4 mg via ORAL
  Filled 2022-07-05 (×5): qty 4

## 2022-07-05 NOTE — Assessment & Plan Note (Signed)
BMI 43.46

## 2022-07-05 NOTE — Progress Notes (Signed)
  Progress Note   Patient: Nicole Austin ZOX:[id number] DOB: 1960-11-02 DOA: 07/04/2022     1 DOS: the patient was seen and examined on 07/05/2022   Brief hospital course: 62 year old female came in after a fall out of bed and was lying on the floor all night long.  She stated that her brother came over and heard her yelling and had to climb through a window to get her.  She was brought into the emergency room and found to be in rhabdomyolysis.  CK was 3039.  5/9.  Continue IV fluids.  CK down to 1873.  Will get PT and OT consultations.  Assessment and Plan: * Traumatic rhabdomyolysis (HCC) Fall out of bed and was lying on the floor all night long.  Continue IV fluid hydration.  CK down to 1873.  Continue IV fluids.  Hold Lipitor   COPD (chronic obstructive pulmonary disease) (HCC) Respiratory status stable.  Continue Spiriva.  Bipolar disorder with depression (HCC) Continue Latuda and Lexapro   Fall PT and OT evaluations  Obesity, Class III, BMI 40-49.9 (morbid obesity) (HCC) BMI 43.46  GERD (gastroesophageal reflux disease)          Subjective: Patient states she had a fall out of bed and was on the floor overnight until her brother found her.  Admitted with rhabdomyolysis and fall.  Her muscles feel so weak.  Always has a resting tremor.  Physical Exam: Vitals:   07/04/22 2041 07/05/22 0009 07/05/22 0403 07/05/22 0902  BP: (!) 124/56 (!) 100/50 (!) 111/56 (!) 139/52  Pulse: 82 65 71 64  Resp: 20 18 18 18   Temp: 98.2 F (36.8 C) 97.9 F (36.6 C) 98.4 F (36.9 C) 98.1 F (36.7 C)  TempSrc: Oral Oral    SpO2: 93% 95% 94% 94%  Weight:      Height:       Physical Exam HENT:     Head: Normocephalic.     Mouth/Throat:     Pharynx: No oropharyngeal exudate.  Eyes:     General: Lids are normal.     Conjunctiva/sclera: Conjunctivae normal.  Cardiovascular:     Rate and Rhythm: Normal rate and regular rhythm.     Heart sounds: Normal heart sounds, S1 normal  and S2 normal.  Pulmonary:     Breath sounds: No decreased breath sounds, wheezing, rhonchi or rales.  Abdominal:     Palpations: Abdomen is soft.     Tenderness: There is no abdominal tenderness.  Musculoskeletal:     Right lower leg: Swelling present.     Left lower leg: Swelling present.  Skin:    General: Skin is warm.     Findings: No rash.  Neurological:     Mental Status: She is alert and oriented to person, place, and time.     Comments: Lower extremity resting tremor     Data Reviewed: Creatinine 0.86, CK 1873  Family Communication: spoke with sister on phone  Disposition: Status is: Inpatient Remains inpatient appropriate because: Continue IV fluids for rhabdomyolysis.  Get PT and OT consultations  Planned Discharge Destination: To be based on PT and OT consultation reports    Time spent: 28 minutes  Author: Alford Highland, MD 07/05/2022 2:26 PM  For on call review [email or web address].

## 2022-07-05 NOTE — Evaluation (Signed)
Physical Therapy Evaluation Patient Details Name: Nicole Austin MRN: [id number] DOB: [birth year] Today's Date: 07/05/2022  History of Present Illness  Tasmin Zahradka is a 62yoF wo comes to [facility] after fall out of bed at home, down on floor for prolonged time. Pt arrives with elevated creatinine. Extensive imaging is negative for any acute changes, however revealing of potential progression of known Left sided hip AVN and femoral head collapse. Pt has been largely immobile since January 2/2 her Left hip pathology, THA has been planned but delayed for medical reasons. Pt has largely been AMB short household distances c RW and WC for out of house mobility.  Clinical Impression  Pt in bed on entry, agreeable to session. Pt c/o significant stiffness and discomfort all over, as well as marked increase in her chronic left hip pain. Pt has know Left hip AVN c femoral head collapse, loss of acetabulum cartilage, awaiting THA since Jan. Seems her recent fall and extended time down has stirred up her chronic hip pain to remarkable levels. Per pt reports, recently received morphine prior to entry, but still having high level anxiety and tearful presentation at author simply asking about LLE reposition/ROM. Pt able to move RLE at hip/knee ankle level, and tolerate painful Left ankle movement (painful to hip area), but fearful of Left knee activation. Pt is fairly limited in mobility at baseline, but able to live alone with family support. In current state, her extreme pain and anxiety are significant barriers to any gross mobility. Will continue to follow.      Recommendations for follow up therapy are one component of a multi-disciplinary discharge planning process, led by the attending physician.  Recommendations may be updated based on patient status, additional functional criteria and insurance authorization.  Follow Up Recommendations Can patient physically be transported by private vehicle: No      Assistance Recommended at Discharge Set up Supervision/Assistance  Patient can return home with the following  Two people to help with walking and/or transfers;Two people to help with bathing/dressing/bathroom    Equipment Recommendations None recommended by PT  Recommendations for Other Services       Functional Status Assessment Patient has had a recent decline in their functional status and demonstrates the ability to make significant improvements in function in a reasonable and predictable amount of time.     Precautions / Restrictions Precautions Precautions: Fall Restrictions Weight Bearing Restrictions: No      Mobility  Bed Mobility               General bed mobility comments: unable, between extreme pain and fear avoidance behaviors, heightened anxiety, pt is very opposed to attempting minimal LLe movement    Transfers                        Ambulation/Gait                  Stairs            Wheelchair Mobility    Modified Rankin (Stroke Patients Only)       Balance                                             Pertinent Vitals/Pain Pain Assessment Pain Assessment: Faces Faces Pain Scale: Hurts worst Pain Location: anxiety, crying, shaking just thinkng about moving LLE  Pain Intervention(s): Limited activity within patient's tolerance, Monitored during session, Premedicated before session    Home Living Family/patient expects to be discharged to:: Private residence Living Arrangements: Alone Available Help at Discharge: Family (brother lives 2 blocks away) Type of Home: Apartment Home Access: Stairs to enter       Home Layout: One level Home Equipment: Agricultural consultant (2 wheels);Rollator (4 wheels);Cane - single point;Shower seat;Wheelchair - manual      Prior Function               Mobility Comments: Uses RW at home, [facility] for community mobility. No falls history ADLs Comments: IND in most ADL.  Has trouble getting into/out of tub; sister assists pt w/ bathing. Pt does not drive, sister assists with transportation and shopping     Hand Dominance        Extremity/Trunk Assessment                Communication      Cognition                                                General Comments      Exercises General Exercises - Lower Extremity Ankle Circles/Pumps: Right, 5 reps, Supine Quad Sets: Right, 5 reps, Supine Short Arc Quad: Right, 5 reps, Supine Heel Slides: Right, 5 reps, Supine   Assessment/Plan    PT Assessment Patient needs continued PT services  PT Problem List Decreased strength;Decreased range of motion;Decreased activity tolerance;Decreased balance;Decreased mobility       PT Treatment Interventions DME instruction;Stair training;Therapeutic activities;Balance training    PT Goals (Current goals can be found in the Care Plan section)  Acute Rehab PT Goals Patient Stated Goal: return to home, continue to regain strength for eventual Left hip THA PT Goal Formulation: With patient Time For Goal Achievement: 07/19/22 Potential to Achieve Goals: Fair    Frequency Min 3X/week     Co-evaluation               AM-PAC PT "6 Clicks" Mobility  Outcome Measure                  End of Session   Activity Tolerance: Patient limited by pain Patient left: in bed;with call bell/phone within reach Nurse Communication: Mobility status PT Visit Diagnosis: Difficulty in walking, not elsewhere classified (R26.2);Other abnormalities of gait and mobility (R26.89);Muscle weakness (generalized) (M62.81);Pain Pain - Right/Left: Left Pain - part of body: Hip    Time: 1610-9604 PT Time Calculation (min) (ACUTE ONLY): 20 min   Charges:   PT Evaluation $PT Eval High Complexity: 1 High         4:35 PM, 07/05/22 Rosamaria Lints, PT, DPT Physical Therapist - [facility]  [phone]  (ASCOM)    Boss Danielsen C 07/05/2022, 4:30 PM

## 2022-07-05 NOTE — Progress Notes (Signed)
Patient had no urine output since admitted to the unit, bladder scan done recorded, NP [facility] notified.

## 2022-07-05 NOTE — Progress Notes (Signed)
       CROSS COVER NOTE  NAME: Nicole Austin MRN: [id number] DOB : 07/17/1960    HPI/Events of Note   Report:no urine output since admission. Bladder scan volume 350  On review of chart: Admitted after fall at home hitting head and prolonged down time on floor till son found her  Has had dizziness recently. Labs significant for AKI and rhado with creat 1.07, CK 3039, normocytic anemia.  BP soft 100/50 (62) Current orders LR at 75 ml/h    Assessment and  Interventions   Assessment:  Plan: NS bolus 500 ml x1       Donnie Mesa NP [facility]

## 2022-07-05 NOTE — Assessment & Plan Note (Addendum)
Fall out of bed and was lying on the floor all night long.  CK down to 979.  Hold Lipitor

## 2022-07-05 NOTE — H&P (Incomplete)
History and Physical     Patient: Nicole Austin ZOX:[id number] DOB: 1960-04-14 DOA: 07/04/2022 DOS: the patient was seen and examined on 07/05/2022 PCP: Danelle Berry, PA-C   Patient coming from: Home  Chief Complaint: Fall .  HISTORY OF PRESENT ILLNESS: Anu Camilleri is an 62 y.o. female coming from home via EMS for Patient fell last night and she did note hitting her head but was awake on the floor running her son found her. Patient does report off-and-on intermittent dizziness at home.  On my exam is alert and awake and oriented answering questions and cooperative in the emergency room She did not have any headaches or seizures or chest pain palpitations or any other complaints overnight patient in triage was 89 to 91% O2 sats improved with oxygenation.  Past Medical History:  Diagnosis Date  . ADHD (attention deficit hyperactivity disorder)   . Apnea, sleep 06/30/2015  . Arthritis   . Asthma   . Asthma with acute exacerbation 06/30/2015  . Bipolar 1 disorder (HCC)   . Chronic pain   . COPD, moderate (HCC) 11/18/2013  . Depression   . DOE (dyspnea on exertion)   . GERD (gastroesophageal reflux disease) 08/30/2015  . Gout 08/16/2014  . History of acute myocardial infarction 06/30/2015  . History of panic attacks   . HPV (human papilloma virus) infection 07/17/2013  . Hyperlipidemia   . Low HDL (under 40) 08/30/2015  . MI (myocardial infarction) (HCC) 2018  . MRSA infection 2023   groin abscess  . 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 Osteoporosis   . Parkinson disease   . Pre-diabetes   . Prediabetes 05/20/2016  . PTSD (post-traumatic stress disorder)   . Restless leg syndrome   . Rhabdomyolysis   . Sleep apnea 06/30/2015  . Stage 3 severe COPD by GOLD classification (HCC) 11/18/2013  . Stroke [facility]) 2018   right arm weakness  . Tremors of nervous system    hands  . Uncomplicated asthma 06/30/2015  . Varicella  02/25/2017   Review of Systems  Constitutional:  Positive for fatigue.  Neurological:  Positive for light-headedness.  All other systems reviewed and are negative.  Allergies  Allergen Reactions  . Chantix [Varenicline] Nausea Only  . Glycopyrrolate Rash    Oral irritation   Past Surgical History:  Procedure Laterality Date  . BACK SURGERY     lumbar  . CATARACT EXTRACTION W/PHACO Right 01/01/2019   Procedure: CATARACT EXTRACTION PHACO AND INTRAOCULAR LENS PLACEMENT (IOC) right vision blue;  Surgeon: Elliot Cousin, MD;  Location: [facility] ORS;  Service: Ophthalmology;  Laterality: Right;  [place] 00:39.4 CDE 5.49 Fluid Pack lot # [id number]  . CATARACT EXTRACTION W/PHACO Left 01/29/2019   Procedure: CATARACT EXTRACTION PHACO AND INTRAOCULAR LENS PLACEMENT (IOC) LEFT Vision Blue;  Surgeon: Elliot Cousin, MD;  Location: [facility] ORS;  Service: Ophthalmology;  Laterality: Left;  [place] 00:51.1 CDE 4.38 Fluid Pack Lot # [id number]  . COLONOSCOPY WITH PROPOFOL N/A 08/07/2017   Procedure: COLONOSCOPY WITH PROPOFOL;  Surgeon: Pasty Spillers, MD;  Location: [facility] ENDOSCOPY;  Service: Endoscopy;  Laterality: N/A;  . HAND SURGERY Left    fractured with pins  . REPLACEMENT TOTAL KNEE Left 2016  . SPINAL CORD STIMULATOR INSERTION  2014  . SPINAL CORD STIMULATOR REMOVAL  2014  . TUBAL LIGATION     MEDICATIONS: Prior to Admission medications   Medication Sig Start Date End Date Taking? Authorizing Provider  acetaminophen (TYLENOL) 500 MG tablet Take 2 tablets (1,000 mg total) by mouth every 8 (eight) hours as needed. 02/08/22   Danelle Berry, PA-C  albuterol (VENTOLIN HFA) 108 (90 Base) MCG/ACT inhaler Inhale 2 puffs into the lungs every 4 (four) hours as needed for wheezing or shortness of breath. 11/08/21   Danelle Berry, PA-C  ALPRAZolam Prudy Feeler) 0.5 MG tablet Take 1 tablet (0.5 mg total) by mouth 2 (two) times daily. 04/10/22   Marrion Coy, MD  atorvastatin (LIPITOR) 40 MG tablet TAKE 1 TABLET BY MOUTH  EVERY NIGHT AT BEDTIME 04/25/21   Margarita Mail, DO  divalproex (DEPAKOTE) 250 MG DR tablet Take 1 tablet (250 mg total) by mouth 2 (two) times daily. 11/19/16   Brandy Hale, MD  doxycycline (VIBRA-TABS) 100 MG tablet Take 1 tablet (100 mg total) by mouth 2 (two) times daily. 06/14/22   Menshew, Charlesetta Ivory, PA-C  escitalopram (LEXAPRO) 20 MG tablet Take 1 tablet (20 mg total) by mouth daily. 01/05/21 05/07/22  [place], Alvira Philips, DO  fluconazole (DIFLUCAN) 200 MG tablet Take 200 mg tablet once every 3 days for 3 doses, then take one 200 mg tablet weekly for 4 weeks. 06/20/22   Margarita Mail, DO  ibuprofen (ADVIL) 200 MG tablet Take 200 mg by mouth every 6 (six) hours as needed.    [provider]  lurasidone (LATUDA) 80 MG TABS tablet Take 1 tablet (80 mg total) by mouth daily with breakfast. 06/06/21   Caro Laroche, DO  meloxicam (MOBIC) 15 MG tablet Take 15 mg by mouth daily.    [provider]  miconazole (MICOTIN) 2 % powder Apply topically 3 (three) times daily as needed for itching. 06/20/22   Margarita Mail, DO  mupirocin ointment (BACTROBAN) 2 % Apply small about inside of both nostrils TWICE a day for the next 5 days. 05/07/22   Verlee Monte, NP  nystatin (MYCOSTATIN/NYSTOP) powder Apply 1 Application topically 2 (two) times daily. 06/14/22   Menshew, Charlesetta Ivory, PA-C  nystatin (MYCOSTATIN/NYSTOP) powder Apply 1 Application topically 3 (three) times daily. 06/20/22   Margarita Mail, DO  nystatin-triamcinolone ointment [facility]) Apply topically 2 (two) times daily as needed (to affected intact skin with yeast rash). 06/05/22   Danelle Berry, PA-C  oxyCODONE (ROXICODONE) 5 MG immediate release tablet Take 1 tablet (5 mg total) by mouth every 6 (six) hours as needed for severe pain. 06/20/22   Henderly, Britni A, PA-C  OXYGEN Inhale 2 L into the lungs as needed.    [provider]  rOPINIRole (REQUIP XL) 4 MG 24 hr tablet Take 4 mg by mouth at bedtime.     [provider]  senna-docusate (SENOKOT-S) 8.6-50 MG tablet Take 1 tablet by mouth 2 (two) times daily. 04/10/22   Marrion Coy, MD  Skin Protectants, Misc. (INTERDRY [id number]") SHEE Cut piece of fabric to fit area of skin affected and apply to skin for for up to 5 days, replace sooner if soiled 06/14/22   Danelle Berry, PA-C  Tiotropium Bromide Monohydrate (SPIRIVA RESPIMAT) 2.5 MCG/ACT AERS Inhale 2 puffs into the lungs daily. 11/21/21   Sharyn Creamer, MD   . ALPRAZolam  0.5 mg Oral BID  . divalproex  250 mg Oral BID  . escitalopram  20 mg Oral Daily  . heparin  5,000 Units Subcutaneous Q8H  . pantoprazole (PROTONIX) IV  40 mg Intravenous Q12H  . sodium chloride flush  3 mL Intravenous Q12H    . lactated ringers Stopped (  07/04/22 1857)  . sodium chloride     ED Course: Pt in Ed alert awake oriented afebrile O2 sats of 93%. Patient requesting her Xanax as she is anxious. Vitals:   07/04/22 1620 07/04/22 1754 07/04/22 1945 07/04/22 2041  BP:  (!) 152/66 (!) 161/76 (!) 124/56  Pulse:  71 72 82  Resp:  13 16 20   Temp:   98.5 F (36.9 C) 98.2 F (36.8 C)  TempSrc:   Oral   SpO2: 95% 97% 98% 93%  Weight:      Height:       No intake/output data recorded. SpO2: 93 % Blood work in ed shows: CMP shows creatinine of 1.07 patient has mild intermittent AKI. Patient CPK is 3039 Results for orders placed or performed during the hospital encounter of 07/04/22 (from the past 72 hour(s))  Basic metabolic panel     Status: Abnormal   Collection Time: 07/04/22  4:04 PM  Result Value Ref Range   Sodium 135 135 - 145 mmol/L   Potassium 3.6 3.5 - 5.1 mmol/L   Chloride 96 (L) 98 - 111 mmol/L   CO2 29 22 - 32 mmol/L   Glucose, Bld 81 70 - 99 mg/dL    Comment: Glucose reference range applies only to samples taken after fasting for at least 8 hours.   BUN 14 8 - 23 mg/dL   Creatinine, Ser 9.14 (H) 0.44 - 1.00 mg/dL   Calcium 9.2 8.9 - 78.2 mg/dL   GFR, Estimated 58 (L) >60 mL/min     Comment: (NOTE) Calculated using the CKD-EPI Creatinine Equation (2021)    Anion gap 10 5 - 15    Comment: Performed at [facility], [address] [place], [place] [address]  CBC     Status: Abnormal   Collection Time: 07/04/22  4:04 PM  Result Value Ref Range   WBC 7.5 4.0 - 10.5 K/uL   RBC 3.72 (L) 3.87 - 5.11 MIL/uL   Hemoglobin 11.4 (L) 12.0 - 15.0 g/dL   HCT 30.8 (L) 65.7 - 84.6 %   MCV 96.5 80.0 - 100.0 fL   MCH 30.6 26.0 - 34.0 pg   MCHC 31.8 30.0 - 36.0 g/dL   RDW 96.2 95.2 - 84.1 %   Platelets 252 150 - 400 K/uL   nRBC 0.0 0.0 - 0.2 %    Comment: Performed at [facility], [address] [place], [place] [address]  CK     Status: Abnormal   Collection Time: 07/04/22  4:04 PM  Result Value Ref Range   Total CK 3,039 (H) 38 - 234 U/L    Comment: Performed at [facility], [address] [place], [place] [address]  Lactic acid, plasma     Status: None   Collection Time: 07/04/22  4:04 PM  Result Value Ref Range   Lactic Acid, Venous 1.1 0.5 - 1.9 mmol/L    Comment: Performed at [facility], [address] [place], [place] [address]  Hepatic function panel     Status: Abnormal   Collection Time: 07/04/22  4:04 PM  Result Value Ref Range   Total Protein 6.4 (L) 6.5 - 8.1 g/dL   Albumin 3.3 (L) 3.5 - 5.0 g/dL   AST 71 (H) 15 - 41 U/L   ALT 22 0 - 44 U/L   Alkaline Phosphatase 59 38 - 126 U/L   Total Bilirubin 0.5 0.3 - 1.2 mg/dL   Bilirubin, Direct <4.0 0.0 -  0.2 mg/dL   Indirect Bilirubin NOT CALCULATED 0.3 - 0.9 mg/dL    Comment: Performed at [facility], [address] [place], [place] [address]  Lactic acid, plasma     Status: None   Collection Time: 07/04/22  7:47 PM  Result Value Ref Range   Lactic Acid, Venous 0.8 0.5 - 1.9 mmol/L    Comment: Performed at [facility], [address] [place], [place] [address]  TSH     Status: None   Collection Time: 07/04/22  7:47 PM  Result Value Ref  Range   TSH 0.759 0.350 - 4.500 uIU/mL    Comment: Performed by a 3rd Generation assay with a functional sensitivity of <=0.01 uIU/mL. Performed at [facility], [address] [place], [place] [address]   T4, free     Status: None   Collection Time: 07/04/22  7:47 PM  Result Value Ref Range   Free T4 0.81 0.61 - 1.12 ng/dL    Comment: (NOTE) Biotin ingestion may interfere with free T4 tests. If the results are inconsistent with the TSH level, previous test results, or the clinical presentation, then consider biotin interference. If needed, order repeat testing after stopping biotin. Performed at [facility], [address] [place], [place] [address]     Lab Results  Component Value Date   CREATININE 1.07 (H) 07/04/2022   CREATININE 1.37 (H) 06/20/2022   CREATININE 1.11 (H) 06/14/2022      Latest Ref Rng & Units 07/04/2022    4:04 PM 06/20/2022    4:58 PM 06/14/2022    3:08 PM  CMP  Glucose 70 - 99 mg/dL 81  86  295   BUN 8 - 23 mg/dL 14  25  19    Creatinine 0.44 - 1.00 mg/dL 6.21  3.08  6.57   Sodium 135 - 145 mmol/L 135  128  126   Potassium 3.5 - 5.1 mmol/L 3.6  4.7  4.7   Chloride 98 - 111 mmol/L 96  93  92   CO2 22 - 32 mmol/L 29  26  26    Calcium 8.9 - 10.3 mg/dL 9.2  9.1  8.8   Total Protein 6.5 - 8.1 g/dL 6.4  6.6    Total Bilirubin 0.3 - 1.2 mg/dL 0.5  0.4    Alkaline Phos 38 - 126 U/L 59  60    AST 15 - 41 U/L 71  14    ALT 0 - 44 U/L 22  10     Unresulted Labs (From admission, onward)     Start     Ordered   07/04/22 1736  Urinalysis, w/ Reflex to Culture (Infection Suspected) -Urine, Clean Catch  Once,   URGENT       Question:  Specimen Source  Answer:  Urine, Clean Catch   07/04/22 1735           Pt has received : Orders Placed This Encounter  Procedures  . CT HEAD WO CONTRAST    Place this order only IF head trauma is known/suspected and pt is taking anticoagulant.    Standing Status:   Standing    Number of Occurrences:    1    Order Specific Question:   Radiology Contrast Protocol - do NOT remove file path    Answer:   [email or web address]  . DG Shoulder Left    Standing Status:   Standing    Number of Occurrences:   1  Order Specific Question:   Reason for Exam (SYMPTOM  OR DIAGNOSIS REQUIRED)    Answer:   fall  . DG Chest Port 1 View    Standing Status:   Standing    Number of Occurrences:   1    Order Specific Question:   Reason for Exam (SYMPTOM  OR DIAGNOSIS REQUIRED)    Answer:   fall  . DG Pelvis 1-2 Views    Standing Status:   Standing    Number of Occurrences:   1    Order Specific Question:   Symptom/Reason for Exam    Answer:   Fall [[id number]]    Order Specific Question:   Call Results- Best Contact Number?    Answer:   fall  . CT Cervical Spine Wo Contrast    Standing Status:   Standing    Number of Occurrences:   1  . CT Thoracic Spine Wo Contrast    Standing Status:   Standing    Number of Occurrences:   1  . CT Lumbar Spine Wo Contrast    Standing Status:   Standing    Number of Occurrences:   1  . Basic metabolic panel    Standing Status:   Standing    Number of Occurrences:   1  . CBC    Standing Status:   Standing    Number of Occurrences:   1  . CK    Standing Status:   Standing    Number of Occurrences:   1  . Lactic acid, plasma    Standing Status:   Standing    Number of Occurrences:   2  . Hepatic function panel    Standing Status:   Standing    Number of Occurrences:   1  . Urinalysis, w/ Reflex to Culture (Infection Suspected) -Urine, Clean Catch    Standing Status:   Standing    Number of Occurrences:   1    Order Specific Question:   Specimen Source    Answer:   Urine, Clean Catch [76]  . TSH    Standing Status:   Standing    Number of Occurrences:   1    Order Specific Question:   Specimen collection method    Answer:   IV Team=IV Team collect  . T4, free    Standing Status:   Standing    Number of Occurrences:   1     Order Specific Question:   Specimen collection method    Answer:   IV Team=IV Team collect  . Document Height and Actual Weight    Use scales to weigh patient, not stated or estimated weight.    Standing Status:   Standing    Number of Occurrences:   1  . Cardiac Monitoring Continuous x 24 hours Indications for use: Other; other indications for use: Rhabdomyolysis    Standing Status:   Standing    Number of Occurrences:   1    Order Specific Question:   Indications for use:    Answer:   Other    Order Specific Question:   other indications for use:    Answer:   Rhabdomyolysis  . Maintain IV access    Standing Status:   Standing    Number of Occurrences:   1  . Vital signs    Standing Status:   Standing    Number of Occurrences:   1  . Notify physician (specify)    Standing  Status:   Standing    Number of Occurrences:   20    Order Specific Question:   Notify Physician    Answer:   for pulse less than 55 or greater than 120    Order Specific Question:   Notify Physician    Answer:   for respiratory rate less than 12 or greater than 25    Order Specific Question:   Notify Physician    Answer:   for temperature greater than 100.5 F    Order Specific Question:   Notify Physician    Answer:   for urinary output less than 30 mL/hr for four hours    Order Specific Question:   Notify Physician    Answer:   for systolic BP less than 90 or greater than 160, diastolic BP less than 60 or greater than 100    Order Specific Question:   Notify Physician    Answer:   for new hypoxia w/ oxygen saturations < 88%  . Progressive Mobility Protocol: No Restrictions    Standing Status:   Standing    Number of Occurrences:   1  . Do not place and if present remove PureWick    Standing Status:   Standing    Number of Occurrences:   1  . Initiate Oral Care Protocol    Standing Status:   Standing    Number of Occurrences:   1  . Initiate Carrier Fluid Protocol    Standing Status:   Standing     Number of Occurrences:   1  . RN may order General Admission PRN Orders utilizing "General Admission PRN medications" (through manage orders) for the following patient needs: allergy symptoms (Claritin), cold sores (Carmex), cough (Robitussin DM), eye irritation (Liquifilm Tears), hemorrhoids (Tucks), indigestion (Maalox), minor skin irritation (Hydrocortisone Cream), muscle pain Romeo Apple Gay), nose irritation (saline nasal spray) and sore throat (Chloraseptic spray).    Standing Status:   Standing    Number of Occurrences:   [id number]  . Swallow screen    Standing Status:   Standing    Number of Occurrences:   1  . Intake and Output    Standing Status:   Standing    Number of Occurrences:   1  . Orthostatic vital signs    Standing Status:   Standing    Number of Occurrences:   1  . Full code    Standing Status:   Standing    Number of Occurrences:   1    Order Specific Question:   By:    Answer:   Other  . Consult to hospitalist    Standing Status:   Standing    Number of Occurrences:   1    Order Specific Question:   Place call to:    Answer:   [phone]    Order Specific Question:   Reason for Consult    Answer:   Admit  . Pulse oximetry check with vital signs    Standing Status:   Standing    Number of Occurrences:   1  . Oxygen therapy Mode or (Route): Nasal cannula; Liters Per Minute: 2; Keep 02 saturation: greater than 92 %    Standing Status:   Standing    Number of Occurrences:   1    Order Specific Question:   Mode or (Route)    Answer:   Nasal cannula    Order Specific Question:   Liters Per Minute    Answer:  2    Order Specific Question:   Keep 02 saturation    Answer:   greater than 92 %  . CBG monitoring, ED    Standing Status:   Standing    Number of Occurrences:   1  . ED EKG    Altered mental status    Standing Status:   Standing    Number of Occurrences:   1    Order Specific Question:   Reason for Exam    Answer:   Other (See Comments)  . EKG 12-Lead     Standing Status:   Standing    Number of Occurrences:   1  . Admit to Inpatient (patient's expected length of stay will be greater than 2 midnights or inpatient only procedure)    Standing Status:   Standing    Number of Occurrences:   1    Order Specific Question:   Hospital Area    Answer:   [facility] [[id number]]    Order Specific Question:   Level of Care    Answer:   Telemetry Medical [104]    Order Specific Question:   Covid Evaluation    Answer:   Asymptomatic - no recent exposure (last 10 days) testing not required    Order Specific Question:   Diagnosis    Answer:   Fall [[id number]]    Order Specific Question:   Admitting Physician    Answer:   Darrold Junker    Order Specific Question:   Attending Physician    Answer:   Darrold Junker    Order Specific Question:   Certification:    Answer:   I certify this patient will need inpatient services for at least 2 midnights    Order Specific Question:   Estimated Length of Stay    Answer:   3  . Aspiration precautions    Standing Status:   Standing    Number of Occurrences:   1  . Fall precautions    Standing Status:   Standing    Number of Occurrences:   1    Meds ordered this encounter  Medications  . acetaminophen (TYLENOL) tablet 1,000 mg  . oxyCODONE (Oxy IR/ROXICODONE) immediate release tablet 5 mg  . ketorolac (TORADOL) 15 MG/ML injection 15 mg  . sodium chloride 0.9 % bolus 1,000 mL  . divalproex (DEPAKOTE) DR tablet 250 mg  . sodium chloride flush (NS) 0.9 % injection 3 mL  . lactated ringers infusion  . OR Linked Order Group   . acetaminophen (TYLENOL) tablet 650 mg   . acetaminophen (TYLENOL) suppository 650 mg  . heparin injection 5,000 Units  . pantoprazole (PROTONIX) injection 40 mg  . morphine (PF) 2 MG/ML injection 2 mg  . escitalopram (LEXAPRO) tablet 20 mg  . ALPRAZolam Prudy Feeler) tablet 0.5 mg    Admission Imaging : CT Thoracic Spine Wo Contrast  Result Date:  07/04/2022 CLINICAL DATA:  Back trauma, no prior imaging (Age >= 16y) EXAM: CT THORACIC AND LUMBAR SPINE WITHOUT CONTRAST TECHNIQUE: Multidetector CT imaging of the thoracic and lumbar spine was performed without contrast. Multiplanar CT image reconstructions were also generated. RADIATION DOSE REDUCTION: This exam was performed according to the departmental dose-optimization program which includes automated exposure control, adjustment of the mA and/or kV according to patient size and/or use of iterative reconstruction technique. COMPARISON:  CT lumbar spine 06/20/2022, CT chest 05/19/2016 FINDINGS: CT THORACIC SPINE FINDINGS Alignment: Normal. Vertebrae: There is unchanged  chronic anterior height loss of T9 and mild anterior wedging of T8. There is a superior endplate Schmorl's node of T9 which is also unchanged. Unchanged mild superior endplate depression of T12. There is no evidence of acute thoracic spine fracture. Paraspinal and other soft tissues: Paraseptal emphysema in the lung apices. Aortic atherosclerosis. Disc levels: There is mild to moderate multilevel degenerative disc disease worst in the midthoracic spine at T7-T8 and T9-T10. Mild-to-moderate multilevel facet arthropathy worst in the upper thoracic spine. No visible impingement. CT LUMBAR SPINE FINDINGS Segmentation: 5 lumbar type vertebrae. Alignment: Slight levoconvex curvature.  No significant listhesis. Vertebrae: No evidence of acute lumbar spine fracture. Unchanged mild degenerative superior endplate irregularity of L1. Paraspinal and other soft tissues: Aortoiliac atherosclerosis. No other significant findings. Disc levels: Mild-to-moderate degenerative disc disease at L3-L4 and L4-L5, with mild disc bulging and bilateral facet arthropathy resulting in mild spinal canal narrowing and neural foraminal narrowing. No visible impingement. Findings are similar to recent CT. IMPRESSION: No evidence of acute fracture in the thoracic or lumbar spine.  Mild to moderate multilevel degenerative changes, worst at T7-T8, T9-T10, L3-L4, and L4-L5. Electronically Signed   By: Caprice Renshaw M.D.   On: 07/04/2022 17:19   CT Lumbar Spine Wo Contrast  Result Date: 07/04/2022 CLINICAL DATA:  Back trauma, no prior imaging (Age >= 16y) EXAM: CT THORACIC AND LUMBAR SPINE WITHOUT CONTRAST TECHNIQUE: Multidetector CT imaging of the thoracic and lumbar spine was performed without contrast. Multiplanar CT image reconstructions were also generated. RADIATION DOSE REDUCTION: This exam was performed according to the departmental dose-optimization program which includes automated exposure control, adjustment of the mA and/or kV according to patient size and/or use of iterative reconstruction technique. COMPARISON:  CT lumbar spine 06/20/2022, CT chest 05/19/2016 FINDINGS: CT THORACIC SPINE FINDINGS Alignment: Normal. Vertebrae: There is unchanged chronic anterior height loss of T9 and mild anterior wedging of T8. There is a superior endplate Schmorl's node of T9 which is also unchanged. Unchanged mild superior endplate depression of T12. There is no evidence of acute thoracic spine fracture. Paraspinal and other soft tissues: Paraseptal emphysema in the lung apices. Aortic atherosclerosis. Disc levels: There is mild to moderate multilevel degenerative disc disease worst in the midthoracic spine at T7-T8 and T9-T10. Mild-to-moderate multilevel facet arthropathy worst in the upper thoracic spine. No visible impingement. CT LUMBAR SPINE FINDINGS Segmentation: 5 lumbar type vertebrae. Alignment: Slight levoconvex curvature.  No significant listhesis. Vertebrae: No evidence of acute lumbar spine fracture. Unchanged mild degenerative superior endplate irregularity of L1. Paraspinal and other soft tissues: Aortoiliac atherosclerosis. No other significant findings. Disc levels: Mild-to-moderate degenerative disc disease at L3-L4 and L4-L5, with mild disc bulging and bilateral facet  arthropathy resulting in mild spinal canal narrowing and neural foraminal narrowing. No visible impingement. Findings are similar to recent CT. IMPRESSION: No evidence of acute fracture in the thoracic or lumbar spine. Mild to moderate multilevel degenerative changes, worst at T7-T8, T9-T10, L3-L4, and L4-L5. Electronically Signed   By: Caprice Renshaw M.D.   On: 07/04/2022 17:19   CT Cervical Spine Wo Contrast  Result Date: 07/04/2022 CLINICAL DATA:  Neck trauma, intoxicated or obtunded (Age >= 16y) EXAM: CT CERVICAL SPINE WITHOUT CONTRAST TECHNIQUE: Multidetector CT imaging of the cervical spine was performed without intravenous contrast. Multiplanar CT image reconstructions were also generated. RADIATION DOSE REDUCTION: This exam was performed according to the departmental dose-optimization program which includes automated exposure control, adjustment of the mA and/or kV according to patient size and/or use of  iterative reconstruction technique. COMPARISON:  CT cervical spine 07/02/2014. FINDINGS: Alignment: None Skull base and vertebrae: There is no evidence of acute cervical spine fracture. Soft tissues and spinal canal: Negative. Disc levels: There is multilevel degenerative disc disease, moderate-severe at C4-C5 and C5-C6 with posterior disc osteophyte complexes, and a suspected left paracentral disc protrusion at C5-C6. progressive multilevel facet arthropathy, worst at C6-C7 and C7-T1 on the right. Upper chest: Paraseptal emphysema in the lung apices. Other: None IMPRESSION: No evidence of acute cervical spine fracture. Multilevel degenerative disc disease, moderate-severe at C4-C5 and C5-C6. Suspected left paracentral disc protrusion at C5-C6. Progressive multilevel facet arthropathy worst at C6-C7 and C7-T1 on the right. Electronically Signed   By: Caprice Renshaw M.D.   On: 07/04/2022 17:09   DG Shoulder Left  Result Date: 07/04/2022 CLINICAL DATA:  Fall EXAM: LEFT SHOULDER - 2+ VIEW COMPARISON:  None  Available. FINDINGS: There is no evidence of acute fracture. Alignment is normal. Mild glenohumeral moderate AC joint osteoarthritis. Soft tissues appear unremarkable radiographically. IMPRESSION: No acute fracture or dislocation. Mild glenohumeral and moderate AC joint osteoarthritis. Electronically Signed   By: Caprice Renshaw M.D.   On: 07/04/2022 16:57   DG Pelvis 1-2 Views  Result Date: 07/04/2022 CLINICAL DATA:  Fall EXAM: PELVIS - 1-2 VIEW COMPARISON:  CT 06/20/2022 FINDINGS: Avascular necrosis of the femoral heads with left femoral head collapse and severe left hip osteoarthritis, similar to prior CT. Mild right hip osteoarthritis. No evidence of acute fracture. IMPRESSION: No evidence of acute fracture. 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 Electronically Signed   By: Caprice Renshaw M.D.   On: 07/04/2022 16:55   DG Chest Port 1 View  Result Date: 07/04/2022 CLINICAL DATA:  Fall out of bed with left shoulder and back pain EXAM: PORTABLE CHEST 1 VIEW COMPARISON:  Chest radiograph dated 04/09/2022 FINDINGS: Normal lung volumes. No focal consolidations. No pleural effusion or pneumothorax. The heart size and mediastinal contours are within normal limits. No acute osseous abnormality. IMPRESSION: No radiographic finding of acute displaced fracture. Electronically Signed   By: Agustin Cree M.D.   On: 07/04/2022 16:55   CT HEAD WO CONTRAST  Result Date: 07/04/2022 CLINICAL DATA:  Head trauma, abnormal mental status (Age 80-64y) EXAM: CT HEAD WITHOUT CONTRAST TECHNIQUE: Contiguous axial images were obtained from the base of the skull through the vertex without intravenous contrast. RADIATION DOSE REDUCTION: This exam was performed according to the departmental dose-optimization program which includes automated exposure control, adjustment of the mA and/or kV according to patient size and/or use of iterative  reconstruction technique. COMPARISON:  Head CT 03/25/2015 brain MRI 07/17/2018 FINDINGS: Brain: No intracranial hemorrhage, mass effect, or midline shift. Mild generalized atrophy, normal for age. No hydrocephalus. The basilar cisterns are patent. No evidence of territorial infarct or acute ischemia. No extra-axial or intracranial fluid collection. Vascular: No hyperdense vessel or unexpected calcification. Skull: No fracture or focal lesion. Sinuses/Orbits: Paranasal sinuses and mastoid air cells are clear. The visualized orbits are unremarkable. Bilateral cataract resection. Other: Right supraorbital dermal piercing. IMPRESSION: No acute intracranial abnormality. No skull fracture. Electronically Signed   By: Narda Rutherford M.D.   On: 07/04/2022 16:04   Physical Examination: Vitals:   07/04/22 1620 07/04/22 1754 07/04/22 1945 07/04/22 2041  BP:  (!) 152/66 (!) 161/76 (!) 124/56  Pulse:  71 72 82  Temp:   98.5 F (36.9 C) 98.2 F (36.8 C)  Resp:  13 16 20   Height:      Weight:      SpO2: 95% 97% 98% 93%  TempSrc:   Oral   BMI (Calculated):       Physical Exam Vitals and nursing note reviewed.  Constitutional:      General: She is not in acute distress.    Appearance: Normal appearance. She is not ill-appearing, toxic-appearing or diaphoretic.  HENT:     Head: Normocephalic and atraumatic.     Right Ear: Hearing and external ear normal.     Left Ear: Hearing and external ear normal.     Nose: Nose normal. No nasal deformity.     Mouth/Throat:     Lips: Pink.     Mouth: Mucous membranes are moist.     Tongue: No lesions.     Pharynx: Oropharynx is clear.  Eyes:     Extraocular Movements: Extraocular movements intact.     Pupils: Pupils are equal, round, and reactive to light.  Neck:     Vascular: No carotid bruit.  Cardiovascular:     Rate and Rhythm: Normal rate and regular rhythm.     Pulses: Normal pulses.     Heart sounds: Normal heart sounds.  Pulmonary:     Effort:  Pulmonary effort is normal.     Breath sounds: Normal breath sounds.  Abdominal:     General: Bowel sounds are normal. There is no distension.     Palpations: Abdomen is soft. There is no mass.     Tenderness: There is no abdominal tenderness. There is no guarding.     Hernia: No hernia is present.  Musculoskeletal:     Right lower leg: No edema.     Left lower leg: No edema.  Skin:    General: Skin is warm.  Neurological:     General: No focal deficit present.     Mental Status: She is alert and oriented to person, place, and time.     Cranial Nerves: Cranial nerves 2-12 are intact.     Motor: Motor function is intact.  Psychiatric:        Attention and Perception: Attention normal.        Mood and Affect: Mood normal.        Speech: Speech normal.        Behavior: Behavior normal. Behavior is cooperative.        Cognition and Memory: Cognition normal.     Assessment and Plan: * Traumatic rhabdomyolysis (HCC) Pt fell yesterday and was on the floor overnight.  She was awake she did not pass out.  This morning when her son came bringing her grocery in the house he brought her to the hospital as she was having pain all over.  In ed pt found to have rhabdo and elevated CPK and low bp. Admission requested for same we will cont with cautious hydration overnight.    COPD (chronic obstructive pulmonary disease) (HCC) Prn albuterol. Scheduled spiriva. Pt no longer smokes.   Bipolar disorder with depression (HCC) PRN xanax continued.    Fall Gait strengthening.  STR or SNF with agressive PT. Fall precaution. Orthostatic BP eval.   GERD (gastroesophageal reflux disease) IV PPI.         DVT prophylaxis:  ***  Code Status:  ***     07/04/2022    9:00 PM  Advanced Directives  Would patient like information on creating a medical advance directive? No - Patient declined  Family Communication:  *** Emergency Contact: Contact Information     Name Relation Home  Work [place] Sister [phone]  [phone]       Disposition Plan:  ***  Consults: *** Admission status: ***  Unit / Expected LOS: ***  Gertha Calkin MD [facility]  6 PM- 2 AM. [phone]( Pager )  For questions regarding this patient please use [email or web address] to contact the current [facility] MD.   Bonita Quin may also call [phone] to contact current Assigned [facility] Attending/Consulting MD for this patient.

## 2022-07-05 NOTE — Progress Notes (Signed)
       CROSS COVER NOTE  NAME: Nicole Austin MRN: [id number] DOB : [birth year]    HPI/Events of Note   Report:intertrigo, uses nystatin powder and cream at hom  On review of chart:    Assessment and  Interventions   Assessment:  Plan: Nystatin powder ordered       Donnie Mesa NP [facility] Cross Cover 7pm-7am - check amion for availability Pager [phone]

## 2022-07-05 NOTE — Hospital Course (Addendum)
62 year old female came in after a fall out of bed and was lying on the floor all night long.  She stated that her brother came over and heard her yelling and had to climb through a window to get her.  She was brought into the emergency room and found to be in rhabdomyolysis.  CK was 3039.  5/9.  Continue IV fluids.  CK down to 1873.  Will get PT and OT consultations. 5/10.  CK down to 979.  Will discontinue IV fluids.  PT and OT recommending rehab.  Patient having left knee pain today.  X-ray does not show any fracture. 5/11.  Patient still having left hip and left knee pain.  Transitional care team notified me that the patient does not want to go out to rehab.  Patient's sister will call her to discuss further. 5/12.  Patient having quite a bit of pain in the left hip and knee.  Patient agreeable to rehab. 5/13.  Slight wheeze and nebulizer treatments ordered. 5/14.  Wheeze resolved.  Patient will be discharged to rehab today

## 2022-07-05 NOTE — Plan of Care (Signed)
  Problem: Education: Goal: Knowledge of General Education information will improve Description: Including pain rating scale, medication(s)/side effects and non-pharmacologic comfort measures Outcome: Progressing   Problem: Health Behavior/Discharge Planning: Goal: Ability to manage health-related needs will improve Outcome: Progressing   Problem: Clinical Measurements: Goal: Ability to maintain clinical measurements within normal limits will improve Outcome: Progressing Goal: Diagnostic test results will improve Outcome: Progressing Goal: Respiratory complications will improve Outcome: Progressing Goal: Cardiovascular complication will be avoided Outcome: Progressing   Problem: Nutrition: Goal: Adequate nutrition will be maintained Outcome: Progressing   Problem: Coping: Goal: Level of anxiety will decrease Outcome: Progressing   Problem: Pain Managment: Goal: General experience of comfort will improve Outcome: Progressing   

## 2022-07-06 ENCOUNTER — Inpatient Hospital Stay: Payer: 59

## 2022-07-06 DIAGNOSIS — T796XXS Traumatic ischemia of muscle, sequela: Secondary | ICD-10-CM | POA: Diagnosis not present

## 2022-07-06 DIAGNOSIS — M25562 Pain in left knee: Secondary | ICD-10-CM

## 2022-07-06 DIAGNOSIS — M8700 Idiopathic aseptic necrosis of unspecified bone: Secondary | ICD-10-CM

## 2022-07-06 DIAGNOSIS — J449 Chronic obstructive pulmonary disease, unspecified: Secondary | ICD-10-CM | POA: Diagnosis not present

## 2022-07-06 HISTORY — DX: Pain in left knee: M25.562

## 2022-07-06 LAB — CK: Total CK: 979 U/L — ABNORMAL HIGH (ref 38–234)

## 2022-07-06 LAB — CBC
HCT: 28.9 % — ABNORMAL LOW (ref 36.0–46.0)
Hemoglobin: 9.6 g/dL — ABNORMAL LOW (ref 12.0–15.0)
MCH: 31.4 pg (ref 26.0–34.0)
MCHC: 33.2 g/dL (ref 30.0–36.0)
MCV: 94.4 fL (ref 80.0–100.0)
Platelets: 232 10*3/uL (ref 150–400)
RBC: 3.06 MIL/uL — ABNORMAL LOW (ref 3.87–5.11)
RDW: 14.7 % (ref 11.5–15.5)
WBC: 3.9 10*3/uL — ABNORMAL LOW (ref 4.0–10.5)
nRBC: 0 % (ref 0.0–0.2)

## 2022-07-06 NOTE — Progress Notes (Signed)
Occupational Therapy Evaluation Patient Details Name: Nicole Austin MRN: [id number] DOB: [birth year] Today's Date: 07/06/2022   History of Present Illness Nicole Austin is a 62yoF wo [facility] after fall out of bed at home, down on floor for prolonged time. Pt arrives with elevated creatinine. Extensive imaging is negative for any acute changes, however revealing of potential progression of known Left sided hip AVN and femoral head collapse. Pt has been largely immobile since January 2/2 her Left hip pathology, THA has been planned but delayed for medical reasons. Pt has largely been AMB short household distances c RW and WC for out of house mobility.   Clinical Impression   Pt was seen for OT evaluation this date. Prior to hospital admission, pt was living alone with family assistance as needed. Pt presents to acute OT demonstrating impaired ADL performance and functional mobility 2/2 left hip pathology. Pt limited by LLE pain (7/10). Pt displayed decreased activity tolerance and functional strength/ROM/balance deficits. Pt currently requires bed mobility, transfer, and ADL assistance.  Pt would benefit from skilled OT to address noted impairments and functional limitations (see below for any additional details). Upon hospital discharge, pt would benefit from follow-up therapy.    Recommendations for follow up therapy are one component of a multi-disciplinary discharge planning process, led by the attending physician.  Recommendations may be updated based on patient status, additional functional criteria and insurance authorization.   Assistance Recommended at Discharge Frequent or constant Supervision/Assistance  Patient can return home with the following A little help with walking and/or transfers;A little help with bathing/dressing/bathroom;Assistance with cooking/housework;Assist for transportation    Functional Status Assessment  Patient has had a recent decline in their functional  status and demonstrates the ability to make significant improvements in function in a reasonable and predictable amount of time.  Equipment Recommendations   (defer)    Recommendations for Other Services       Precautions / Restrictions Precautions Precautions: Fall Restrictions Weight Bearing Restrictions: No      Mobility Bed Mobility Overal bed mobility: Needs Assistance Bed Mobility: Sit to Supine, Supine to Sit     Supine to sit: Max assist, +2 for physical assistance Sit to supine: Max assist        Transfers Overall transfer level: Needs assistance Equipment used: Rolling walker (2 wheels) Transfers: Sit to/from Stand Sit to Stand: Min assist, +2 physical assistance                  Balance Overall balance assessment: Needs assistance Sitting-balance support: Feet supported Sitting balance-Leahy Scale: Good     Standing balance support: Reliant on assistive device for balance Standing balance-Leahy Scale: Fair                             ADL either performed or assessed with clinical judgement   ADL Overall ADL's : Needs assistance/impaired                                       General ADL Comments: MIN A seated grooming EOB, MIN A x2 + RW simulated bed side commode tx.     Vision         Perception     Praxis      Pertinent Vitals/Pain Pain Assessment Pain Assessment: 0-10 Pain Score: 7  Pain Location: LLE Pain Descriptors /  Indicators: Discomfort, Grimacing, Shooting Pain Intervention(s): Patient requesting pain meds-RN notified     Hand Dominance Right   Extremity/Trunk Assessment Upper Extremity Assessment Upper Extremity Assessment: Overall WFL for tasks assessed   Lower Extremity Assessment Lower Extremity Assessment: Generalized weakness       Communication Communication Communication: No difficulties   Cognition Arousal/Alertness: Awake/alert Behavior During Therapy: WFL for tasks  assessed/performed Overall Cognitive Status: Within Functional Limits for tasks assessed                                 General Comments: pt was anxious prior to movement, but tolerated activity well.     General Comments       Exercises     Shoulder Instructions      Home Living Family/patient expects to be discharged to:: Private residence Living Arrangements: Alone Available Help at Discharge: Family;Available PRN/intermittently (Brother in-law and sister) Type of Home: Apartment Home Access: Stairs to enter Secretary/administrator of Steps: 1 Entrance Stairs-Rails: None Home Layout: One level     Bathroom Shower/Tub: Chief Strategy Officer: Standard     Home Equipment: Agricultural consultant (2 wheels);Rollator (4 wheels);Cane - single point;Shower seat;Wheelchair - manual   Additional Comments: Home setup per chart.      Prior Functioning/Environment Prior Level of Function : Needs assist             Mobility Comments: Uses RW at home, [facility] for community mobility. No falls history ADLs Comments: Recently using bed side commode, family assist bathing        OT Problem List: Decreased strength;Decreased activity tolerance;Impaired balance (sitting and/or standing);Pain      OT Treatment/Interventions: Self-care/ADL training;Therapeutic exercise;DME and/or AE instruction;Therapeutic activities;Patient/family education    OT Goals(Current goals can be found in the care plan section) Acute Rehab OT Goals Patient Stated Goal: Improve pain OT Goal Formulation: With patient Time For Goal Achievement: 07/20/22 Potential to Achieve Goals: Good ADL Goals Pt Will Perform Grooming: with modified independence;standing Pt Will Perform Lower Body Dressing: with modified independence;sitting/lateral leans Pt Will Transfer to Toilet: with modified independence;ambulating;bedside commode  OT Frequency: Min 1X/week    Co-evaluation               AM-PAC OT "6 Clicks" Daily Activity     Outcome Measure Help from another person eating meals?: None Help from another person taking care of personal grooming?: A Little Help from another person toileting, which includes using toliet, bedpan, or urinal?: A Lot Help from another person bathing (including washing, rinsing, drying)?: A Lot Help from another person to put on and taking off regular upper body clothing?: A Little Help from another person to put on and taking off regular lower body clothing?: A Lot 6 Click Score: 16   End of Session Equipment Utilized During Treatment: Rolling walker (2 wheels) Nurse Communication: Patient requests pain meds;Mobility status  Activity Tolerance: Patient tolerated treatment well Patient left: with bed alarm set;with call bell/phone within reach;with family/visitor present;in bed  OT Visit Diagnosis: Unsteadiness on feet (R26.81);Muscle weakness (generalized) (M62.81)                Time: 1610-9604 OT Time Calculation (min): 16 min Charges:  OT General Charges $OT Visit: 1 Visit OT Evaluation $OT Eval Moderate Complexity: 1 Mod  [address] OTS

## 2022-07-06 NOTE — Assessment & Plan Note (Addendum)
Bilateral hips.

## 2022-07-06 NOTE — Progress Notes (Addendum)
  Progress Note   Patient: Nicole Austin ZOX:[id number] DOB: 11/29/1960 DOA: 07/04/2022     2 DOS: the patient was seen and examined on 07/06/2022   Brief hospital course: 62 year old female came in after a fall out of bed and was lying on the floor all night long.  She stated that her brother came over and heard her yelling and had to climb through a window to get her.  She was brought into the emergency room and found to be in rhabdomyolysis.  CK was 3039.  5/9.  Continue IV fluids.  CK down to 1873.  Will get PT and OT consultations. 5/10.  CK down to 979.  Will discontinue IV fluids.  PT and OT recommending rehab.  Patient having left knee pain today.  X-ray does not show any fracture.  Assessment and Plan: * Traumatic rhabdomyolysis (HCC) Fall out of bed and was lying on the floor all night long.  Continue IV fluid hydration.  CK down to 1873.  Continue IV fluids.  Hold Lipitor   Left knee pain Appreciate orthopedic evaluation.  X-ray does not show any fracture.  COPD (chronic obstructive pulmonary disease) (HCC) Respiratory status stable.  Continue Spiriva.  Avascular necrosis (HCC) Bilateral hips.  Bipolar disorder with depression (HCC) Continue Latuda and Lexapro   Fall PT and OT evaluations  Obesity, Class III, BMI 40-49.9 (morbid obesity) (HCC) BMI 43.46        Subjective: Patient complains of left knee pain and difficulty moving.  She also states that she has issues with her hips and has avascular necrosis  Physical Exam: Vitals:   07/05/22 1646 07/05/22 2341 07/06/22 0845 07/06/22 1546  BP: (!) 135/58 (!) 157/68 (!) 148/57 (!) 157/59  Pulse: 71 72 64 67  Resp: 18 20 18 19   Temp: 98.2 F (36.8 C) 99.6 F (37.6 C) 98.2 F (36.8 C) 98.2 F (36.8 C)  TempSrc:   Oral Oral  SpO2: 96% 93% 96% 93%  Weight:      Height:       Physical Exam HENT:     Head: Normocephalic.     Mouth/Throat:     Pharynx: No oropharyngeal exudate.  Eyes:     General:  Lids are normal.     Conjunctiva/sclera: Conjunctivae normal.  Cardiovascular:     Rate and Rhythm: Normal rate and regular rhythm.     Heart sounds: Normal heart sounds, S1 normal and S2 normal.  Pulmonary:     Breath sounds: No decreased breath sounds, wheezing, rhonchi or rales.  Abdominal:     Palpations: Abdomen is soft.     Tenderness: There is no abdominal tenderness.  Musculoskeletal:     Left knee: Swelling and bony tenderness present. Decreased range of motion.     Right lower leg: Swelling present.     Left lower leg: Swelling present.  Skin:    General: Skin is warm.     Findings: No rash.  Neurological:     Mental Status: She is alert and oriented to person, place, and time.     Comments: Lower extremity resting tremor     Data Reviewed: Hypoxic count 3.9, hemoglobin 9.6, CK 979  Family Communication: Left message for Sister  Disposition: Status is: Inpatient Remains inpatient appropriate because: Likely will end up needing rehab  Planned Discharge Destination: Rehab    Time spent: 28 minutes  Author: Alford Highland, MD 07/06/2022 6:00 PM  For on call review [email or web address].

## 2022-07-06 NOTE — Assessment & Plan Note (Signed)
X-ray does not show any fracture.  Orthopedics recommends outpatient follow-up.

## 2022-07-06 NOTE — [place] FL2 (Signed)
[place] MEDICAID FL2 LEVEL OF CARE FORM     IDENTIFICATION  Patient Name: Nicole Austin Birthdate: [birth year] Sex: female Admission Date (Current Location): 07/04/2022  [facility] [place] and [place] Number:  Chiropodist and Address:  [facility], [address], [place], [place] [address]      Provider Number: [id number]  Attending Physician Name and Address:  Alford Highland, MD  Relative Name and Phone Number:  Hali Marry (Sister) [phone] [facility])    Current Level of Care: Hospital Recommended Level of Care: Skilled Nursing Facility Prior Approval Number:    Date Approved/Denied:   PASRR Number: [id number]  Discharge Plan:      Current Diagnoses: Patient Active Problem List   Diagnosis Date Noted   Fall 07/04/2022   Traumatic rhabdomyolysis (HCC) 07/04/2022   History of methicillin resistant staphylococcus aureus (MRSA) 05/03/2022   Avascular necrosis of bones of both hips (HCC) 05/03/2022   Chronic prescription benzodiazepine use 05/03/2022   Polypharmacy 04/09/2022   Sinus bradycardia 04/09/2022   Depression with anxiety 04/09/2022   AVN of femur (HCC) 03/21/2022   MRSA (methicillin resistant staph aureus) culture positive 05/04/2021   Prolonged QT interval 01/01/2021   Benign neoplasm of ascending colon    Polyp of sigmoid colon    Bipolar disorder with depression (HCC) 02/25/2017   ADD (attention deficit disorder) 02/25/2017   Marijuana use 02/13/2017   Neuropathy, peripheral 05/07/2016   Vitamin B12 deficiency 10/24/2015   Fibromyalgia 08/30/2015   OP (osteoporosis) 06/30/2015   Sleep apnea 06/30/2015   HLD (hyperlipidemia) 05/13/2015   Obesity, Class III, BMI 40-49.9 (morbid obesity) (HCC) 08/16/2014   Low back pain with sciatica 08/04/2014   Polysubstance abuse (HCC) 07/04/2014   Anxiety, generalized 11/24/2013   COPD (chronic obstructive pulmonary disease) (HCC) 11/18/2013   Arthritis of knee,  degenerative 07/15/2013   Osteoarthritis of knee, unspecified 07/15/2013    Orientation RESPIRATION BLADDER Height & Weight     Self, Time, Situation, Place  Normal External catheter Weight: 230 lb (104.3 kg) Height:  5\' 1"  (154.9 cm)  BEHAVIORAL SYMPTOMS/MOOD NEUROLOGICAL BOWEL NUTRITION STATUS      Continent Diet (regular)  AMBULATORY STATUS COMMUNICATION OF NEEDS Skin   Limited Assist Verbally Other (Comment) (redness)                       Personal Care Assistance Level of Assistance  Bathing, Feeding, Dressing Bathing Assistance: Limited assistance Feeding assistance: Limited assistance Dressing Assistance: Limited assistance     Functional Limitations Info             SPECIAL CARE FACTORS FREQUENCY  PT (By licensed PT), OT (By licensed OT)     PT Frequency: 5 times per week OT Frequency: 5 times per week            Contractures      Additional Factors Info  Code Status, Allergies Code Status Info: full Allergies Info: chantix (varenicline), glycopyrrolate           Current Medications (07/06/2022):  This is the current hospital active medication list Current Facility-Administered Medications  Medication Dose Route Frequency Provider Last Rate Last Admin   acetaminophen (TYLENOL) tablet 650 mg  650 mg Oral Q6H PRN Gertha Calkin, MD   650 mg at 07/05/22 1406   Or   acetaminophen (TYLENOL) suppository 650 mg  650 mg Rectal Q6H PRN Gertha Calkin, MD       ALPRAZolam Prudy Feeler) tablet 0.5 mg  0.5 mg Oral BID Irena Cords V, MD   0.5 mg at 07/06/22 1610   cyclobenzaprine (FLEXERIL) tablet 5 mg  5 mg Oral TID PRN Alford Highland, MD   5 mg at 07/05/22 1751   divalproex (DEPAKOTE) DR tablet 250 mg  250 mg Oral BID Irena Cords V, MD   250 mg at 07/06/22 0929   escitalopram (LEXAPRO) tablet 20 mg  20 mg Oral Daily Gertha Calkin, MD   20 mg at 07/06/22 0929   heparin injection 5,000 Units  5,000 Units Subcutaneous Q8H Gertha Calkin, MD   5,000 Units at 07/06/22  9604   lactated ringers infusion   Intravenous Continuous Gertha Calkin, MD 75 mL/hr at 07/05/22 2159 New Bag at 07/05/22 2159   lurasidone (LATUDA) tablet 60 mg  60 mg Oral Q breakfast Lowella Bandy, RPH   60 mg at 07/06/22 5409   morphine (PF) 2 MG/ML injection 2 mg  2 mg Intravenous Q4H PRN Gertha Calkin, MD   2 mg at 07/06/22 8119   nystatin (MYCOSTATIN/NYSTOP) topical powder   Topical TID Manuela Schwartz, NP   Given at 07/06/22 0930   rOPINIRole (REQUIP) tablet 4 mg  4 mg Oral QHS Lowella Bandy, RPH   4 mg at 07/05/22 2149   sodium chloride 0.9 % bolus 1,000 mL  1,000 mL Intravenous Once Pilar Jarvis, MD       sodium chloride flush (NS) 0.9 % injection 3 mL  3 mL Intravenous Q12H Gertha Calkin, MD   3 mL at 07/05/22 2150     Discharge Medications: Please see discharge summary for a list of discharge medications.  Relevant Imaging Results:  Relevant Lab Results:   Additional Information SS #: [id number]  Chucky Homes E Kiearra Oyervides, LCSW

## 2022-07-06 NOTE — TOC Initial Note (Signed)
Transition of Care [facility]) - Initial/Assessment Note    Patient Details  Name: Nicole Austin MRN: [id number] Date of Birth: 1960/11/16  [facility] of [facility]) CM/SW Contact:    Liliana Cline, LCSW Phone Number: 07/06/2022, 12:09 PM  Clinical Narrative:                 CSW spoke with patient regarding SNF recommendation. Patient lives alone. Her siblings are supportive.  Patient is active with [facility] (RN) at home.  Patient is agreeable to SNF.  SNF workup started.     Expected Discharge Plan: Skilled Nursing Facility Barriers to Discharge: Continued Medical Work up   Patient Goals and CMS Choice Patient states their goals for this hospitalization and ongoing recovery are:: agreeable to SNF recommendation CMS Medicare.gov Compare Post Acute Care list provided to:: Patient Choice offered to / list presented to : Patient      Expected Discharge Plan and Services       Living arrangements for the past 2 months: Single Family Home                                      Prior Living Arrangements/Services Living arrangements for the past 2 months: Single Family Home Lives with:: Self Patient language and need for interpreter reviewed:: Yes        Need for Family Participation in Patient Care: Yes (Comment) Care giver support system in place?: Yes (comment) Current home services: Home PT, DME Criminal Activity/Legal Involvement Pertinent to Current Situation/Hospitalization: No - Comment as needed  Activities of Daily Living Home Assistive Devices/Equipment: Dan Humphreys (specify type) ADL Screening (condition at time of admission) Patient's cognitive ability adequate to safely complete daily activities?: Yes Is the patient deaf or have difficulty hearing?: No Does the patient have difficulty seeing, even when wearing glasses/contacts?: No Does the patient have difficulty concentrating, remembering, or making decisions?: No Patient able to  express need for assistance with ADLs?: Yes Does the patient have difficulty dressing or bathing?: Yes Independently performs ADLs?: No Does the patient have difficulty walking or climbing stairs?: Yes Weakness of Legs: Both Weakness of Arms/Hands: None  Permission Sought/Granted Permission sought to share information with : Facility Industrial/product designer granted to share information with : Yes, Verbal Permission Granted     Permission granted to share info w AGENCY: SNFs        Emotional Assessment       Orientation: : Oriented to Self, Oriented to Place, Oriented to  Time, Oriented to Situation Alcohol / Substance Use: Not Applicable Psych Involvement: No (comment)  Admission diagnosis:  Fall [W19.XXXA] Minor head injury, initial encounter [S09.90XA] Fall, initial encounter [W19.XXXA] Cervical strain, acute, initial encounter [S16.1XXA] Traumatic rhabdomyolysis, initial encounter (HCC) [T79.6XXA] Acute bilateral low back pain without sciatica [M54.50] Patient Active Problem List   Diagnosis Date Noted   Fall 07/04/2022   Traumatic rhabdomyolysis (HCC) 07/04/2022   History of methicillin resistant staphylococcus aureus (MRSA) 05/03/2022   Avascular necrosis of bones of both hips (HCC) 05/03/2022   Chronic prescription benzodiazepine use 05/03/2022   Polypharmacy 04/09/2022   Sinus bradycardia 04/09/2022   Depression with anxiety 04/09/2022   AVN of femur (HCC) 03/21/2022   MRSA (methicillin resistant staph aureus) culture positive 05/04/2021   Prolonged QT interval 01/01/2021   Benign neoplasm of ascending colon    Polyp of sigmoid colon    Bipolar disorder  with depression (HCC) 02/25/2017   ADD (attention deficit disorder) 02/25/2017   Marijuana use 02/13/2017   Neuropathy, peripheral 05/07/2016   Vitamin B12 deficiency 10/24/2015   Fibromyalgia 08/30/2015   OP (osteoporosis) 06/30/2015   Sleep apnea 06/30/2015   HLD (hyperlipidemia) 05/13/2015    Obesity, Class III, BMI 40-49.9 (morbid obesity) (HCC) 08/16/2014   Low back pain with sciatica 08/04/2014   Polysubstance abuse (HCC) 07/04/2014   Anxiety, generalized 11/24/2013   COPD (chronic obstructive pulmonary disease) (HCC) 11/18/2013   Arthritis of knee, degenerative 07/15/2013   Osteoarthritis of knee, unspecified 07/15/2013   PCP:  Danelle Berry, PA-C Pharmacy:   [facility] [place], [place] - [address] [place] [address] Phone: [phone] Fax: [phone]  [facility] [place], [place] - [address] [place] [address] Phone: [phone] Fax: [phone]  [facility] [address], [place] - [address] [place] [address] Phone: [phone] Fax: [phone]  [id number] - [place], [place] - [address] AT [facility] [address] [facility] [address] [place] [address] Phone: [phone] Fax: [phone]  [id number] - [place], [place] - [address] AT [facility] [address] [place] [address] Phone: [phone] Fax: [phone]     Social Determinants of Health (SDOH) Social History: SDOH Screenings   Food Insecurity: No Food Insecurity (07/04/2022)  Housing: Low Risk  (07/04/2022)  Transportation Needs: No Transportation Needs (07/04/2022)  Utilities: Not At Risk (07/04/2022)  Alcohol Screen: Low Risk  (03/21/2022)  Depression (PHQ2-9): High Risk (06/14/2022)  Financial Resource Strain: Low Risk  (04/25/2020)  Recent Concern: Financial Resource Strain - Medium Risk (03/17/2020)  Physical Activity: Inactive (03/21/2021)  Social Connections: Socially Isolated (03/21/2021)  Stress: Stress Concern Present (03/21/2021)  Tobacco Use: Medium Risk (07/04/2022)   SDOH Interventions:     Readmission Risk Interventions     No data to display

## 2022-07-06 NOTE — Care Management Important Message (Signed)
Important Message  Patient Details  Name: Nicole Austin MRN: [id number] Date of Birth: 06-22-1960   Medicare Important Message Given:  N/A - LOS <3 / Initial given by admissions     [name] 07/06/2022, 10:15 AM

## 2022-07-06 NOTE — Consult Note (Signed)
[facility] Customer service manager [facility]) Accountable Care Organization (ACO) [facility] Liaison Note  07/06/2022  Nicole Austin June 01, 1960 [id number]  Location: [facility] RN Hospital Liaison screened the patient remotely at [facility].  Insurance: [employer]   Nicole Austin is a 62 y.o. female who is a Primary Care Patient of Danelle Berry, [place]. The patient was screened for  readmission hospitalization with noteextreme risk score for unplanned readmission risk with  2 IP/ 13 ED in 6 months.  The patient was assessed for potential [facility]) Care Management service needs for post hospital transition for care coordination. Review of patient's electronic medical record reveals patient pending SNF placement. Will alert PAC-RN if pt is discharged to a facility [facility] follows.   Plan: [facility] Liaison will continue to follow progress and disposition to asess for post hospital community care coordination/management needs.  Referral request for community care coordination: pending disposition.   [facility] Care Management/Population Health does not replace or interfere with any arrangements made by the Inpatient Transition of Care team.   For questions contact:   Elliot Cousin, RN, BSN [facility] Liaison [place]   [facility]  Population Health Office Hours MTWF 8:00 am to 6 pm off on Thursday [phone] mobile [phone] [Office toll free line][facility] Office Hours are M-F 8:30 - 5 pm 24 hour nurse advise line [phone] Conceirge  [email or web address] [email or web address]

## 2022-07-06 NOTE — Consult Note (Signed)
ORTHOPAEDIC CONSULTATION  REQUESTING PHYSICIAN: Alford Highland, MD  Chief Complaint:   Left knee pain  History of Present Illness: Nicole Austin is a 62 y.o. female who had a fall out of bed and was unable to get back up overnight.  She was found to be in rhabdomyolysis and admitted for this.  She was also found to have significant left knee pain.  Per the patient, this has been present for over 1 month now.  Her knee pain is not any worse after her fall.  She states she has been ambulatory with a walker for short distances around the house.  Past Medical History:  Diagnosis Date   ADHD (attention deficit hyperactivity disorder)    Apnea, sleep 06/30/2015   Arthritis    Asthma    Asthma with acute exacerbation 06/30/2015   Bipolar 1 disorder (HCC)    Chronic pain    COPD, moderate (HCC) 11/18/2013   Depression    DOE (dyspnea on exertion)    GERD (gastroesophageal reflux disease) 08/30/2015   Gout 08/16/2014   History of acute myocardial infarction 06/30/2015   History of panic attacks    HPV (human papilloma virus) infection 07/17/2013   Hyperlipidemia    Low HDL (under 40) 08/30/2015   MI (myocardial infarction) (HCC) 2018   MRSA infection 2023   groin abscess   Nose colonized with MRSA 03/21/2022   a.) PCR (+) prior to LEFT THA   Osteoporosis    Parkinson disease    Pre-diabetes    Prediabetes 05/20/2016   PTSD (post-traumatic stress disorder)    Restless leg syndrome    Rhabdomyolysis    Sleep apnea 06/30/2015   Stage 3 severe COPD by GOLD classification (HCC) 11/18/2013   Stroke (HCC) 2018   right arm weakness   Tremors of nervous system    hands   Uncomplicated asthma 06/30/2015   Varicella 02/25/2017   Past Surgical History:  Procedure Laterality Date   BACK SURGERY     lumbar   CATARACT EXTRACTION W/PHACO Right 01/01/2019   Procedure: CATARACT EXTRACTION PHACO AND INTRAOCULAR LENS  PLACEMENT (IOC) right vision blue;  Surgeon: Elliot Cousin, MD;  Location: [facility];  Service: Ophthalmology;  Laterality: Right;  [place] 00:39.4 CDE 5.49 Fluid Pack lot # [id number]   CATARACT EXTRACTION W/PHACO Left 01/29/2019   Procedure: CATARACT EXTRACTION PHACO AND INTRAOCULAR LENS PLACEMENT (IOC) LEFT Vision Blue;  Surgeon: Elliot Cousin, MD;  Location: [facility];  Service: Ophthalmology;  Laterality: Left;  [place] 00:51.1 CDE 4.38 Fluid Pack Lot # [id number]   COLONOSCOPY WITH PROPOFOL N/A 08/07/2017   Procedure: COLONOSCOPY WITH PROPOFOL;  Surgeon: Pasty Spillers, MD;  Location: [facility];  Service: Endoscopy;  Laterality: N/A;   HAND SURGERY Left    fractured with pins   REPLACEMENT TOTAL KNEE Left 2016   SPINAL CORD STIMULATOR INSERTION  2014   SPINAL CORD STIMULATOR REMOVAL  2014   TUBAL LIGATION     Social History   Socioeconomic History   Marital status: Single    Spouse name: Not on file   Number of children: 3   Years of education: Not on file   Highest education level: 10th grade  Occupational History   Occupation: Disability  Tobacco Use   Smoking status: Former    Packs/day: 0.50    Years: 36.00    Additional pack years: 0.00    Total pack years: 18.00    Types: Cigarettes    Quit date: 03/13/2022  Years since quitting: 0.3    Passive exposure: Past   Smokeless tobacco: Never   Tobacco comments:    Pt using nicotine patches; 1/2 PPD as of 03/21/21  Vaping Use   Vaping Use: Some days   Substances: Nicotine, Flavoring  Substance and Sexual Activity   Alcohol use: No    Alcohol/week: 0.0 standard drinks of alcohol   Drug use: Yes    Types: Marijuana    Comment: occasionally   Sexual activity: Not Currently  Other Topics Concern   Not on file  Social History Narrative   Pt lives alone   Social Determinants of Health   Financial Resource Strain: Low Risk  (04/25/2020)   Overall Financial Resource Strain (CARDIA)    Difficulty of Paying Living  Expenses: Not hard at all  Recent Concern: Financial Resource Strain - Medium Risk (03/17/2020)   Overall Financial Resource Strain (CARDIA)    Difficulty of Paying Living Expenses: Somewhat hard  Food Insecurity: No Food Insecurity (07/04/2022)   Hunger Vital Sign    Worried About Running Out of Food in the Last Year: Never true    Ran Out of Food in the Last Year: Never true  Transportation Needs: No Transportation Needs (07/04/2022)   PRAPARE - Administrator, Civil Service (Medical): No    Lack of Transportation (Non-Medical): No  Physical Activity: Inactive (03/21/2021)   Exercise Vital Sign    Days of Exercise per Week: 0 days    Minutes of Exercise per Session: 0 min  Stress: Stress Concern Present (03/21/2021)   [employer] of Occupational Health - Occupational Stress Questionnaire    Feeling of Stress : Very much  Social Connections: Socially Isolated (03/21/2021)   Social Connection and Isolation Panel [NHANES]    Frequency of Communication with Friends and Family: More than three times a week    Frequency of Social Gatherings with Friends and Family: More than three times a week    Attends Religious Services: Never    Database administrator or Organizations: No    Attends Engineer, structural: Never    Marital Status: Divorced   Family History  Problem Relation Age of Onset   Hernia Mother    Heart disease Mother    OCD Mother    Diabetes Mother    Parkinson's disease Father    Bipolar disorder Sister    Schizophrenia Sister    ADD / ADHD Sister    Alcohol abuse Brother    Bipolar disorder Sister    Paranoid behavior Sister    ADD / ADHD Sister    ADD / ADHD Son    Dementia Maternal Grandmother    Emphysema Maternal Grandfather    ADD / ADHD Son    ADD / ADHD Son    Depression Son    Allergies  Allergen Reactions   Chantix [Varenicline] Nausea Only   Glycopyrrolate Rash    Oral irritation   Prior to Admission medications    Medication Sig Start Date End Date Taking? Authorizing Provider  acetaminophen (TYLENOL) 500 MG tablet Take 2 tablets (1,000 mg total) by mouth every 8 (eight) hours as needed. 02/08/22  Yes Danelle Berry, PA-C  albuterol (VENTOLIN HFA) 108 (90 Base) MCG/ACT inhaler Inhale 2 puffs into the lungs every 4 (four) hours as needed for wheezing or shortness of breath. 11/08/21  Yes Danelle Berry, PA-C  ALPRAZolam Prudy Feeler) 0.5 MG tablet Take 1 tablet (0.5 mg total) by mouth 2 (two)  times daily. 04/10/22  Yes Marrion Coy, MD  atorvastatin (LIPITOR) 40 MG tablet TAKE 1 TABLET BY MOUTH EVERY NIGHT AT BEDTIME 04/25/21  Yes Margarita Mail, DO  celecoxib (CELEBREX) 200 MG capsule Take 200 mg by mouth 2 (two) times daily. 06/27/22  Yes [provider]  divalproex (DEPAKOTE) 250 MG DR tablet Take 1 tablet (250 mg total) by mouth 2 (two) times daily. 11/19/16  Yes Brandy Hale, MD  escitalopram (LEXAPRO) 20 MG tablet Take 1 tablet (20 mg total) by mouth daily. 01/05/21 07/04/22 Yes [place], Eric J, DO  fluconazole (DIFLUCAN) 200 MG tablet Take 200 mg tablet once every 3 days for 3 doses, then take one 200 mg tablet weekly for 4 weeks. 06/20/22  Yes Margarita Mail, DO  ibuprofen (ADVIL) 200 MG tablet Take 200 mg by mouth every 6 (six) hours as needed.   Yes [provider]  lurasidone (LATUDA) 80 MG TABS tablet Take 1 tablet (80 mg total) by mouth daily with breakfast. Patient taking differently: Take 60 mg by mouth daily with breakfast. 06/06/21  Yes Rumball, Darl Householder, DO  meloxicam (MOBIC) 15 MG tablet Take 15 mg by mouth daily.   Yes [provider]  miconazole (MICOTIN) 2 % powder Apply topically 3 (three) times daily as needed for itching. 06/20/22  Yes Margarita Mail, DO  mupirocin ointment (BACTROBAN) 2 % Apply small about inside of both nostrils TWICE a day for the next 5 days. 05/07/22  Yes Verlee Monte, NP  nystatin (MYCOSTATIN/NYSTOP) powder Apply 1 Application topically 3  (three) times daily. 06/20/22  Yes Margarita Mail, DO  nystatin-triamcinolone ointment [facility]) Apply topically 2 (two) times daily as needed (to affected intact skin with yeast rash). 06/05/22  Yes Danelle Berry, PA-C  rOPINIRole (REQUIP XL) 4 MG 24 hr tablet Take 4 mg by mouth at bedtime.   Yes [provider]  senna-docusate (SENOKOT-S) 8.6-50 MG tablet Take 1 tablet by mouth 2 (two) times daily. 04/10/22  Yes Marrion Coy, MD  Tiotropium Bromide Monohydrate (SPIRIVA RESPIMAT) 2.5 MCG/ACT AERS Inhale 2 puffs into the lungs daily. 11/21/21  Yes Sharyn Creamer, MD  doxycycline (VIBRA-TABS) 100 MG tablet Take 1 tablet (100 mg total) by mouth 2 (two) times daily. Patient not taking: Reported on 07/04/2022 06/14/22   Menshew, Charlesetta Ivory, PA-C  oxyCODONE (ROXICODONE) 5 MG immediate release tablet Take 1 tablet (5 mg total) by mouth every 6 (six) hours as needed for severe pain. Patient not taking: Reported on 07/04/2022 06/20/22   Henderly, Britni A, PA-C  OXYGEN Inhale 2 L into the lungs as needed.    [provider]  Skin Protectants, Misc. (INTERDRY 19"J478") SHEE Cut piece of fabric to fit area of skin affected and apply to skin for for up to 5 days, replace sooner if soiled 06/14/22   Danelle Berry, PA-C   Recent Labs    07/04/22 1604 07/05/22 0851 07/06/22 0804  WBC 7.5  --  3.9*  HGB 11.4*  --  9.6*  HCT 35.9*  --  28.9*  PLT 252  --  232  K 3.6 3.7  --   CL 96* 101  --   CO2 29 28  --   BUN 14 15  --   CREATININE 1.07* 0.86  --   GLUCOSE 81 87  --   CALCIUM 9.2 8.4*  --    DG Knee 1-2 Views Left  Result Date: 07/06/2022 CLINICAL DATA:  Pt complains of continued knee pain after  fall on Tuesday. Pt states that she has issues with her left hip and walks with a walker normally, however, she's had more difficulty walking with the knee and says that any movement is painful at this time EXAM: LEFT KNEE - 1-2 VIEW COMPARISON:  None available FINDINGS: Left total knee prosthesis  is well seated without periprosthetic fracture or lucency. Deformity of the proximal fibula most consistent with prior healed fracture. Small knee joint effusion. IMPRESSION: 1. No acute fracture or dislocation of the left knee. 2. Small suprapatellar knee joint effusion. Electronically Signed   By: Acquanetta Belling M.D.   On: 07/06/2022 14:37   CT Thoracic Spine Wo Contrast  Result Date: 07/04/2022 CLINICAL DATA:  Back trauma, no prior imaging (Age >= 16y) EXAM: CT THORACIC AND LUMBAR SPINE WITHOUT CONTRAST TECHNIQUE: Multidetector CT imaging of the thoracic and lumbar spine was performed without contrast. Multiplanar CT image reconstructions were also generated. RADIATION DOSE REDUCTION: This exam was performed according to the departmental dose-optimization program which includes automated exposure control, adjustment of the mA and/or kV according to patient size and/or use of iterative reconstruction technique. COMPARISON:  CT lumbar spine 06/20/2022, CT chest 05/19/2016 FINDINGS: CT THORACIC SPINE FINDINGS Alignment: Normal. Vertebrae: There is unchanged chronic anterior height loss of T9 and mild anterior wedging of T8. There is a superior endplate Schmorl's node of T9 which is also unchanged. Unchanged mild superior endplate depression of T12. There is no evidence of acute thoracic spine fracture. Paraspinal and other soft tissues: Paraseptal emphysema in the lung apices. Aortic atherosclerosis. Disc levels: There is mild to moderate multilevel degenerative disc disease worst in the midthoracic spine at T7-T8 and T9-T10. Mild-to-moderate multilevel facet arthropathy worst in the upper thoracic spine. No visible impingement. CT LUMBAR SPINE FINDINGS Segmentation: 5 lumbar type vertebrae. Alignment: Slight levoconvex curvature.  No significant listhesis. Vertebrae: No evidence of acute lumbar spine fracture. Unchanged mild degenerative superior endplate irregularity of L1. Paraspinal and other soft tissues:  Aortoiliac atherosclerosis. No other significant findings. Disc levels: Mild-to-moderate degenerative disc disease at L3-L4 and L4-L5, with mild disc bulging and bilateral facet arthropathy resulting in mild spinal canal narrowing and neural foraminal narrowing. No visible impingement. Findings are similar to recent CT. IMPRESSION: No evidence of acute fracture in the thoracic or lumbar spine. Mild to moderate multilevel degenerative changes, worst at T7-T8, T9-T10, L3-L4, and L4-L5. Electronically Signed   By: Caprice Renshaw M.D.   On: 07/04/2022 17:19   CT Lumbar Spine Wo Contrast  Result Date: 07/04/2022 CLINICAL DATA:  Back trauma, no prior imaging (Age >= 16y) EXAM: CT THORACIC AND LUMBAR SPINE WITHOUT CONTRAST TECHNIQUE: Multidetector CT imaging of the thoracic and lumbar spine was performed without contrast. Multiplanar CT image reconstructions were also generated. RADIATION DOSE REDUCTION: This exam was performed according to the departmental dose-optimization program which includes automated exposure control, adjustment of the mA and/or kV according to patient size and/or use of iterative reconstruction technique. COMPARISON:  CT lumbar spine 06/20/2022, CT chest 05/19/2016 FINDINGS: CT THORACIC SPINE FINDINGS Alignment: Normal. Vertebrae: There is unchanged chronic anterior height loss of T9 and mild anterior wedging of T8. There is a superior endplate Schmorl's node of T9 which is also unchanged. Unchanged mild superior endplate depression of T12. There is no evidence of acute thoracic spine fracture. Paraspinal and other soft tissues: Paraseptal emphysema in the lung apices. Aortic atherosclerosis. Disc levels: There is mild to moderate multilevel degenerative disc disease worst in the midthoracic spine at T7-T8 and T9-T10.  Mild-to-moderate multilevel facet arthropathy worst in the upper thoracic spine. No visible impingement. CT LUMBAR SPINE FINDINGS Segmentation: 5 lumbar type vertebrae. Alignment:  Slight levoconvex curvature.  No significant listhesis. Vertebrae: No evidence of acute lumbar spine fracture. Unchanged mild degenerative superior endplate irregularity of L1. Paraspinal and other soft tissues: Aortoiliac atherosclerosis. No other significant findings. Disc levels: Mild-to-moderate degenerative disc disease at L3-L4 and L4-L5, with mild disc bulging and bilateral facet arthropathy resulting in mild spinal canal narrowing and neural foraminal narrowing. No visible impingement. Findings are similar to recent CT. IMPRESSION: No evidence of acute fracture in the thoracic or lumbar spine. Mild to moderate multilevel degenerative changes, worst at T7-T8, T9-T10, L3-L4, and L4-L5. Electronically Signed   By: Caprice Renshaw M.D.   On: 07/04/2022 17:19   CT Cervical Spine Wo Contrast  Result Date: 07/04/2022 CLINICAL DATA:  Neck trauma, intoxicated or obtunded (Age >= 16y) EXAM: CT CERVICAL SPINE WITHOUT CONTRAST TECHNIQUE: Multidetector CT imaging of the cervical spine was performed without intravenous contrast. Multiplanar CT image reconstructions were also generated. RADIATION DOSE REDUCTION: This exam was performed according to the departmental dose-optimization program which includes automated exposure control, adjustment of the mA and/or kV according to patient size and/or use of iterative reconstruction technique. COMPARISON:  CT cervical spine 07/02/2014. FINDINGS: Alignment: None Skull base and vertebrae: There is no evidence of acute cervical spine fracture. Soft tissues and spinal canal: Negative. Disc levels: There is multilevel degenerative disc disease, moderate-severe at C4-C5 and C5-C6 with posterior disc osteophyte complexes, and a suspected left paracentral disc protrusion at C5-C6. progressive multilevel facet arthropathy, worst at C6-C7 and C7-T1 on the right. Upper chest: Paraseptal emphysema in the lung apices. Other: None IMPRESSION: No evidence of acute cervical spine fracture.  Multilevel degenerative disc disease, moderate-severe at C4-C5 and C5-C6. Suspected left paracentral disc protrusion at C5-C6. Progressive multilevel facet arthropathy worst at C6-C7 and C7-T1 on the right. Electronically Signed   By: Caprice Renshaw M.D.   On: 07/04/2022 17:09   DG Shoulder Left  Result Date: 07/04/2022 CLINICAL DATA:  Fall EXAM: LEFT SHOULDER - 2+ VIEW COMPARISON:  None Available. FINDINGS: There is no evidence of acute fracture. Alignment is normal. Mild glenohumeral moderate AC joint osteoarthritis. Soft tissues appear unremarkable radiographically. IMPRESSION: No acute fracture or dislocation. Mild glenohumeral and moderate AC joint osteoarthritis. Electronically Signed   By: Caprice Renshaw M.D.   On: 07/04/2022 16:57   DG Pelvis 1-2 Views  Result Date: 07/04/2022 CLINICAL DATA:  Fall EXAM: PELVIS - 1-2 VIEW COMPARISON:  CT 06/20/2022 FINDINGS: Avascular necrosis of the femoral heads with left femoral head collapse and severe left hip osteoarthritis, similar to prior CT. Mild right hip osteoarthritis. No evidence of acute fracture. IMPRESSION: No evidence of acute fracture. 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 Electronically Signed   By: Caprice Renshaw M.D.   On: 07/04/2022 16:55   DG Chest Port 1 View  Result Date: 07/04/2022 CLINICAL DATA:  Fall out of bed with left shoulder and back pain EXAM: PORTABLE CHEST 1 VIEW COMPARISON:  Chest radiograph dated 04/09/2022 FINDINGS: Normal lung volumes. No focal consolidations. No pleural effusion or pneumothorax. The heart size and mediastinal contours are within normal limits. No acute osseous abnormality. IMPRESSION: No radiographic finding of acute displaced fracture. Electronically Signed   By: Agustin Cree M.D.   On: 07/04/2022 16:55     Positive ROS: All other systems  have been reviewed and were otherwise negative with the exception of those  mentioned in the HPI and as above.  Physical Exam: BP (!) 157/59 (BP Location: Right Arm)   Pulse 67   Temp 98.2 F (36.8 C) (Oral)   Resp 19   Ht 5\' 1"  (1.549 m)   Wt 104.3 kg   LMP  (LMP Unknown)   SpO2 93%   BMI 43.46 kg/m  General:  Alert, no acute distress Psychiatric:  Patient is competent for consent with normal mood and affect     Orthopedic Exam:  LLE: 5/5 DF/PF/EHL SILT s/s/t/sp/dp distr Foot wwp Well-healed midline incision + effusion Able to passively range knee from 0-85, although there is pain with knee flexion.  Patient able to actively flex her knee as well, again with significant pain.  X-rays:  As above: No new fracture or dislocation.  Patient has had prior total knee arthroplasty without signs of complication or failure.  There does appear to be a mild joint effusion present.  She also has known bilateral, left greater than right, hip osteonecrosis.  Assessment/Plan: 62 year old female with knee pain after undergoing left knee arthroplasty.  The pain has remained unchanged for the past 1 month.  She also has bilateral hip AVN. 1.  We discussed the clinical and imaging findings.  Given the chronicity of her issues, I recommended outpatient follow-up with a joint arthroplasty surgeon.  Patient in agreement with this plan.  2.  Recommend continued PT/OT for mobility.  3.  Will plan to follow peripherally while inpatient.  Please page with any questions.    Signa Kell   07/06/2022 4:32 PM

## 2022-07-07 DIAGNOSIS — D509 Iron deficiency anemia, unspecified: Secondary | ICD-10-CM | POA: Insufficient documentation

## 2022-07-07 DIAGNOSIS — J449 Chronic obstructive pulmonary disease, unspecified: Secondary | ICD-10-CM | POA: Diagnosis not present

## 2022-07-07 DIAGNOSIS — M8700 Idiopathic aseptic necrosis of unspecified bone: Secondary | ICD-10-CM | POA: Diagnosis not present

## 2022-07-07 DIAGNOSIS — G8929 Other chronic pain: Secondary | ICD-10-CM

## 2022-07-07 DIAGNOSIS — T796XXS Traumatic ischemia of muscle, sequela: Secondary | ICD-10-CM | POA: Diagnosis not present

## 2022-07-07 DIAGNOSIS — M25562 Pain in left knee: Secondary | ICD-10-CM | POA: Diagnosis not present

## 2022-07-07 MED ORDER — POLYETHYLENE GLYCOL 3350 17 G PO PACK
17.0000 g | PACK | Freq: Every day | ORAL | Status: DC
  Administered 2022-07-07 – 2022-07-10 (×4): 17 g via ORAL
  Filled 2022-07-07 (×4): qty 1

## 2022-07-07 NOTE — TOC Progression Note (Addendum)
Transition of Care [facility]) - Progression Note    Patient Details  Name: Nicole Austin MRN: [id number] Date of Birth: April 11, 1960  [facility] CM/SW Contact  Liliana Cline, LCSW Phone Number: 07/07/2022, 11:35 AM  Clinical Narrative:    Patient has 1 bed offer at [facility]. Provided bed offer to patient. Patient states she does not want to go to SNF. She would like to go home.  Explained the PT recommendation is for SNF and the team feels this is the safest option for patient. Patient states she feels safe returning home. She states she lives alone, but her siblings can check on her and provide assistance as needed.  Patient states she has [facility] coming out, and previously had PT as well but told them she did not want it. Patient states she wants to give the PT another try at home. Notified Barbara Cower with [facility] who states they can add PT services.  Updated MD.   12:45- MD spoke to sister who spoke to patient who is now agreeable to rehab.  CSW confirmed this with the patient.  Notified Michelle at [facility] of accepting bed offer. Marcelino Duster states the earliest they can take patient is Monday. MD updated.  Updated Barbara Cower with [facility].   Expected Discharge Plan: Skilled Nursing Facility Barriers to Discharge: Continued Medical Work up  Expected Discharge Plan and Services       Living arrangements for the past 2 months: Single Family Home                                       Social Determinants of Health (SDOH) Interventions SDOH Screenings   Food Insecurity: No Food Insecurity (07/04/2022)  Housing: Low Risk  (07/04/2022)  Transportation Needs: No Transportation Needs (07/04/2022)  Utilities: Not At Risk (07/04/2022)  Alcohol Screen: Low Risk  (03/21/2022)  Depression (PHQ2-9): High Risk (06/14/2022)  Financial Resource Strain: Low Risk  (04/25/2020)  Recent Concern: Financial Resource Strain - Medium Risk (03/17/2020)  Physical Activity:  Inactive (03/21/2021)  Social Connections: Socially Isolated (03/21/2021)  Stress: Stress Concern Present (03/21/2021)  Tobacco Use: Medium Risk (07/04/2022)    Readmission Risk Interventions     No data to display

## 2022-07-07 NOTE — Assessment & Plan Note (Signed)
Last hemoglobin 9.8.

## 2022-07-07 NOTE — Progress Notes (Addendum)
Progress Note   Patient: Nicole Austin ZOX:[id number] DOB: [birth year] DOA: 07/04/2022     3 DOS: the patient was seen and examined on 07/07/2022   Brief hospital course: 62 year old female came in after a fall out of bed and was lying on the floor all night long.  She stated that her brother came over and heard her yelling and had to climb through a window to get her.  She was brought into the emergency room and found to be in rhabdomyolysis.  CK was 3039.  5/9.  Continue IV fluids.  CK down to 1873.  Will get PT and OT consultations. 5/10.  CK down to 979.  Will discontinue IV fluids.  PT and OT recommending rehab.  Patient having left knee pain today.  X-ray does not show any fracture. 5/11.  Patient still having left hip and left knee pain.  Transitional care team notified me that the patient does not want to go out to rehab.  Patient's sister will call her to discuss further.  Assessment and Plan: * Traumatic rhabdomyolysis (HCC) Fall out of bed and was lying on the floor all night long.  CK down to 979.  Hold Lipitor   Left knee pain X-ray does not show any fracture.  Orthopedics recommends outpatient follow-up.  COPD (chronic obstructive pulmonary disease) (HCC) Respiratory status stable.  Continue Spiriva.  Avascular necrosis (HCC) Bilateral hips.  Bipolar disorder with depression (HCC) Continue Latuda and Lexapro   Iron deficiency anemia Last hemoglobin 9.6.  Fall PT and OT recommending rehab.  Notified by transitional care team that patient is wanting to go home.  Spoke with patient's sister and she will call patient to discuss further.  Obesity, Class III, BMI 40-49.9 (morbid obesity) (HCC) BMI 43.46        Subjective: Patient with left hip and knee pain.  Notified by Child psychotherapist that she does not want to go out to rehab.  Physical Exam: Vitals:   07/06/22 0845 07/06/22 1546 07/06/22 2350 07/07/22 0742  BP: (!) 148/57 (!) 157/59 (!) 151/48 (!) 165/58   Pulse: 64 67 64 (!) 59  Resp: 18 19 16 20   Temp: 98.2 F (36.8 C) 98.2 F (36.8 C) 98.5 F (36.9 C) 98.6 F (37 C)  TempSrc: Oral Oral  Oral  SpO2: 96% 93% 91% 93%  Weight:      Height:       Physical Exam HENT:     Head: Normocephalic.     Mouth/Throat:     Pharynx: No oropharyngeal exudate.  Eyes:     General: Lids are normal.     Conjunctiva/sclera: Conjunctivae normal.  Cardiovascular:     Rate and Rhythm: Normal rate and regular rhythm.     Heart sounds: Normal heart sounds, S1 normal and S2 normal.  Pulmonary:     Breath sounds: No decreased breath sounds, wheezing, rhonchi or rales.  Abdominal:     Palpations: Abdomen is soft.     Tenderness: There is no abdominal tenderness.  Musculoskeletal:     Left knee: Swelling and bony tenderness present. Decreased range of motion.     Right lower leg: Swelling present.     Left lower leg: Swelling present.  Skin:    General: Skin is warm.     Findings: No rash.  Neurological:     Mental Status: She is alert and oriented to person, place, and time.     Comments: Unable to straight leg raise with left leg.  Able to straight leg raise with right leg.     Data Reviewed: Last hemoglobin 9.6  Family Communication: Updated patient's sister on the phone  Disposition: Status is: Inpatient Remains inpatient appropriate because: Physical therapy recommending rehab but patient notified social worker today that she does not want to go and would rather go home with home health  Planned Discharge Destination: Physical therapy recommending rehab but patient likely wants to go home with home health.  Patient's sister will speak with patient about plans after the hospitalization.    Time spent: 27 minutes  Author: Alford Highland, MD 07/07/2022 12:27 PM  For on call review [email or web address].   Addendum.  Spoke with sister again and patient will go out to rehab.  The patient was worried about her cat.  The patient's sister will  take care of her cat while she is at rehab.

## 2022-07-08 DIAGNOSIS — D508 Other iron deficiency anemias: Secondary | ICD-10-CM

## 2022-07-08 DIAGNOSIS — M25562 Pain in left knee: Secondary | ICD-10-CM | POA: Diagnosis not present

## 2022-07-08 DIAGNOSIS — T796XXS Traumatic ischemia of muscle, sequela: Secondary | ICD-10-CM | POA: Diagnosis not present

## 2022-07-08 DIAGNOSIS — J449 Chronic obstructive pulmonary disease, unspecified: Secondary | ICD-10-CM | POA: Diagnosis not present

## 2022-07-08 DIAGNOSIS — M8700 Idiopathic aseptic necrosis of unspecified bone: Secondary | ICD-10-CM | POA: Diagnosis not present

## 2022-07-08 LAB — BASIC METABOLIC PANEL
Anion gap: 6 (ref 5–15)
BUN: 12 mg/dL (ref 8–23)
CO2: 28 mmol/L (ref 22–32)
Calcium: 8.6 mg/dL — ABNORMAL LOW (ref 8.9–10.3)
Chloride: 97 mmol/L — ABNORMAL LOW (ref 98–111)
Creatinine, Ser: 0.86 mg/dL (ref 0.44–1.00)
GFR, Estimated: >60 mL/min (ref >60)
Glucose, Bld: 105 mg/dL — ABNORMAL HIGH (ref 70–99)
Potassium: 3.9 mmol/L (ref 3.5–5.1)
Sodium: 131 mmol/L — ABNORMAL LOW (ref 135–145)

## 2022-07-08 LAB — CK: Total CK: 279 U/L — ABNORMAL HIGH (ref 38–234)

## 2022-07-08 LAB — CBC
HCT: 29.6 % — ABNORMAL LOW (ref 36.0–46.0)
Hemoglobin: 9.8 g/dL — ABNORMAL LOW (ref 12.0–15.0)
MCH: 31.1 pg (ref 26.0–34.0)
MCHC: 33.1 g/dL (ref 30.0–36.0)
MCV: 94 fL (ref 80.0–100.0)
Platelets: 247 10*3/uL (ref 150–400)
RBC: 3.15 MIL/uL — ABNORMAL LOW (ref 3.87–5.11)
RDW: 14.5 % (ref 11.5–15.5)
WBC: 6.1 10*3/uL (ref 4.0–10.5)
nRBC: 0 % (ref 0.0–0.2)

## 2022-07-08 LAB — FERRITIN: Ferritin: 26 ng/mL (ref 11–307)

## 2022-07-08 MED ORDER — MORPHINE SULFATE (PF) 2 MG/ML IV SOLN
2.0000 mg | INTRAVENOUS | Status: DC | PRN
  Administered 2022-07-08 – 2022-07-09 (×7): 2 mg via INTRAVENOUS
  Filled 2022-07-08 (×7): qty 1

## 2022-07-08 MED ORDER — OXYCODONE HCL 5 MG PO TABS
10.0000 mg | ORAL_TABLET | Freq: Four times a day (QID) | ORAL | Status: DC | PRN
  Administered 2022-07-08 – 2022-07-10 (×7): 10 mg via ORAL
  Filled 2022-07-08 (×7): qty 2

## 2022-07-08 NOTE — TOC Progression Note (Signed)
Transition of Care [facility]) - Progression Note    Patient Details  Name: Nicole Austin MRN: [id number] Date of Birth: [birth year]  [facility]) CM/SW Contact  Liliana Cline, LCSW Phone Number: 07/08/2022, 11:14 AM  Clinical Narrative:    [facility] and started insurance auth. Faxed clinicals. Plan for [facility] tomorrow 5/13 if Berkley Harvey is approved.    Expected Discharge Plan: Skilled Nursing Facility Barriers to Discharge: Continued Medical Work up  Expected Discharge Plan and Services       Living arrangements for the past 2 months: Single Family Home                                       Social Determinants of Health (SDOH) Interventions SDOH Screenings   Food Insecurity: No Food Insecurity (07/04/2022)  Housing: Low Risk  (07/04/2022)  Transportation Needs: No Transportation Needs (07/04/2022)  Utilities: Not At Risk (07/04/2022)  Alcohol Screen: Low Risk  (03/21/2022)  Depression (PHQ2-9): High Risk (06/14/2022)  Financial Resource Strain: Low Risk  (04/25/2020)  Recent Concern: Financial Resource Strain - Medium Risk (03/17/2020)  Physical Activity: Inactive (03/21/2021)  Social Connections: Socially Isolated (03/21/2021)  Stress: Stress Concern Present (03/21/2021)  Tobacco Use: Medium Risk (07/04/2022)    Readmission Risk Interventions     No data to display

## 2022-07-08 NOTE — Plan of Care (Signed)

## 2022-07-08 NOTE — Progress Notes (Signed)
Progress Note   Patient: Nicole Austin ZOX:[id number] DOB: [birth year] DOA: 07/04/2022     4 DOS: the patient was seen and examined on 07/08/2022   Brief hospital course: 62 year old female came in after a fall out of bed and was lying on the floor all night long.  She stated that her brother came over and heard her yelling and had to climb through a window to get her.  She was brought into the emergency room and found to be in rhabdomyolysis.  CK was 3039.  5/9.  Continue IV fluids.  CK down to 1873.  Will get PT and OT consultations. 5/10.  CK down to 979.  Will discontinue IV fluids.  PT and OT recommending rehab.  Patient having left knee pain today.  X-ray does not show any fracture. 5/11.  Patient still having left hip and left knee pain.  Transitional care team notified me that the patient does not want to go out to rehab.  Patient's sister will call her to discuss further. 5/12.  Patient having quite a bit of pain in the left hip and knee.  Patient agreeable to rehab.  Assessment and Plan: * Traumatic rhabdomyolysis (HCC) Fall out of bed and was lying on the floor all night long.  CK down to 279.  Hold Lipitor.  Awaiting insurance authorization to rehab.   Left knee pain X-ray does not show any fracture.  Orthopedics recommends outpatient follow-up.  Chronic pain medications.  COPD (chronic obstructive pulmonary disease) (HCC) Respiratory status stable.  Continue Spiriva.  Avascular necrosis (HCC) Bilateral hips.  On chronic pain medications.  Bipolar disorder with depression (HCC) Continue Latuda and Lexapro   Iron deficiency anemia Last hemoglobin 9.8.  Fall PT and OT recommending rehab.  Patient agreeable to rehab.  Obesity, Class III, BMI 40-49.9 (morbid obesity) (HCC) BMI 43.46        Subjective: Patient agreeable to rehab.  Patient this morning was having a lot of discomfort in her left hip and knee.  She states she takes chronic oxycodone at  home.  Physical Exam: Vitals:   07/07/22 0742 07/07/22 1730 07/07/22 2120 07/08/22 0838  BP: (!) 165/58 (!) 133/52 (!) 137/53 (!) 118/56  Pulse: (!) 59 63 65 (!) 59  Resp: 20 18    Temp: 98.6 F (37 C) 98.1 F (36.7 C) 98.1 F (36.7 C)   TempSrc: Oral Oral    SpO2: 93% 92% 95% 94%  Weight:      Height:       Physical Exam HENT:     Head: Normocephalic.     Mouth/Throat:     Pharynx: No oropharyngeal exudate.  Eyes:     General: Lids are normal.     Conjunctiva/sclera: Conjunctivae normal.  Cardiovascular:     Rate and Rhythm: Normal rate and regular rhythm.     Heart sounds: Normal heart sounds, S1 normal and S2 normal.  Pulmonary:     Breath sounds: No decreased breath sounds, wheezing, rhonchi or rales.  Abdominal:     Palpations: Abdomen is soft.     Tenderness: There is no abdominal tenderness.  Musculoskeletal:     Left knee: Swelling and bony tenderness present. Decreased range of motion.     Right lower leg: Swelling present.     Left lower leg: Swelling present.  Skin:    General: Skin is warm.     Findings: No rash.  Neurological:     Mental Status: She is alert and  oriented to person, place, and time.     Comments: Seen while sitting in the chair.  Able to lift bilateral knees up off the chair.  Slight weakness when trying to keep the left leg straight out.     Data Reviewed: Sodium 131, creatinine 0.86, CK2 79, hemoglobin 9.8  Family Communication: Updated patient's sister yesterday  Disposition: Status is: Inpatient Remains inpatient appropriate because: Will need insurance authorization for rehab  Planned Discharge Destination: Rehab    Time spent: 27 minutes  Author: Alford Highland, MD 07/08/2022 2:39 PM  For on call review [email or web address].

## 2022-07-09 ENCOUNTER — Encounter (INDEPENDENT_AMBULATORY_CARE_PROVIDER_SITE_OTHER): Payer: 59 | Admitting: Family Medicine

## 2022-07-09 DIAGNOSIS — F1910 Other psychoactive substance abuse, uncomplicated: Secondary | ICD-10-CM

## 2022-07-09 DIAGNOSIS — M25562 Pain in left knee: Secondary | ICD-10-CM | POA: Diagnosis not present

## 2022-07-09 DIAGNOSIS — M87052 Idiopathic aseptic necrosis of left femur: Secondary | ICD-10-CM

## 2022-07-09 DIAGNOSIS — M87051 Idiopathic aseptic necrosis of right femur: Secondary | ICD-10-CM

## 2022-07-09 DIAGNOSIS — F319 Bipolar disorder, unspecified: Secondary | ICD-10-CM

## 2022-07-09 DIAGNOSIS — E222 Syndrome of inappropriate secretion of antidiuretic hormone: Secondary | ICD-10-CM | POA: Insufficient documentation

## 2022-07-09 DIAGNOSIS — J449 Chronic obstructive pulmonary disease, unspecified: Secondary | ICD-10-CM | POA: Diagnosis not present

## 2022-07-09 DIAGNOSIS — G8929 Other chronic pain: Secondary | ICD-10-CM

## 2022-07-09 DIAGNOSIS — F1290 Cannabis use, unspecified, uncomplicated: Secondary | ICD-10-CM

## 2022-07-09 DIAGNOSIS — R269 Unspecified abnormalities of gait and mobility: Secondary | ICD-10-CM

## 2022-07-09 DIAGNOSIS — Z79899 Other long term (current) drug therapy: Secondary | ICD-10-CM

## 2022-07-09 DIAGNOSIS — M545 Low back pain, unspecified: Secondary | ICD-10-CM

## 2022-07-09 DIAGNOSIS — E871 Hypo-osmolality and hyponatremia: Secondary | ICD-10-CM

## 2022-07-09 DIAGNOSIS — T796XXS Traumatic ischemia of muscle, sequela: Secondary | ICD-10-CM | POA: Diagnosis not present

## 2022-07-09 DIAGNOSIS — M8700 Idiopathic aseptic necrosis of unspecified bone: Secondary | ICD-10-CM | POA: Diagnosis not present

## 2022-07-09 MED ORDER — ALBUTEROL SULFATE (2.5 MG/3ML) 0.083% IN NEBU
2.5000 mg | INHALATION_SOLUTION | Freq: Four times a day (QID) | RESPIRATORY_TRACT | Status: DC
  Administered 2022-07-09 – 2022-07-10 (×3): 2.5 mg via RESPIRATORY_TRACT
  Filled 2022-07-09 (×3): qty 3

## 2022-07-09 MED ORDER — MELOXICAM 7.5 MG PO TABS
15.0000 mg | ORAL_TABLET | Freq: Every day | ORAL | Status: DC
  Administered 2022-07-09 – 2022-07-10 (×2): 15 mg via ORAL
  Filled 2022-07-09 (×2): qty 2

## 2022-07-09 MED ORDER — BUDESONIDE 0.25 MG/2ML IN SUSP
0.2500 mg | Freq: Two times a day (BID) | RESPIRATORY_TRACT | Status: DC
  Administered 2022-07-09 – 2022-07-10 (×2): 0.25 mg via RESPIRATORY_TRACT
  Filled 2022-07-09 (×2): qty 2

## 2022-07-09 MED ORDER — TIOTROPIUM BROMIDE MONOHYDRATE 18 MCG IN CAPS
18.0000 ug | ORAL_CAPSULE | Freq: Every day | RESPIRATORY_TRACT | Status: DC
  Administered 2022-07-09 – 2022-07-10 (×2): 18 ug via RESPIRATORY_TRACT
  Filled 2022-07-09: qty 5

## 2022-07-09 NOTE — Assessment & Plan Note (Signed)
Sodium a few points less than the normal range.

## 2022-07-09 NOTE — Telephone Encounter (Signed)
Pt has an appt on 07/20/22

## 2022-07-09 NOTE — Progress Notes (Signed)
Physical Therapy Treatment Patient Details Name: Nicole Austin MRN: [id number] DOB: [birth year] Today's Date: 07/09/2022   History of Present Illness Marene Tanji is a 62yoF wo comes to [facility] after fall out of bed at home, down on floor for prolonged time. Pt arrives with elevated creatinine. Extensive imaging is negative for any acute changes, however revealing of potential progression of known Left sided hip AVN and femoral head collapse. Pt has been largely immobile since January 2/2 her Left hip pathology, THA has been planned but delayed for medical reasons. Pt has largely been AMB short household distances c RW and WC for out of house mobility.    PT Comments    Pt seen for PT tx with pt agreeable, premedicated for session, & pt motivated to participate. Pt is making great progress with functional mobility as she was able to transfer STS from recliner with close supervision & ambulate to door & back with RW & close supervision<>CGA. Pt continues to still be limited by pain (0/10 at beginning of session, increased to 9/10 at end) but is motivated to participate to ultimately return home after rehab.     Recommendations for follow up therapy are one component of a multi-disciplinary discharge planning process, led by the attending physician.  Recommendations may be updated based on patient status, additional functional criteria and insurance authorization.  Follow Up Recommendations  Can patient physically be transported by private vehicle: Yes    Assistance Recommended at Discharge Set up Supervision/Assistance  Patient can return home with the following A little help with walking and/or transfers;A little help with bathing/dressing/bathroom;Assistance with cooking/housework;Assist for transportation;Help with stairs or ramp for entrance   Equipment Recommendations  None recommended by PT    Recommendations for Other Services       Precautions / Restrictions  Precautions Precautions: Fall Restrictions Weight Bearing Restrictions: No     Mobility  Bed Mobility               General bed mobility comments: not tested, pt received & left sitting in recliner    Transfers Overall transfer level: Needs assistance Equipment used: Rolling walker (2 wheels) Transfers: Sit to/from Stand Sit to Stand: Supervision           General transfer comment: extra time to power up to standing but able to do so with close supervision    Ambulation/Gait Ambulation/Gait assistance: Min guard, Supervision Gait Distance (Feet): 25 Feet Assistive device: Rolling walker (2 wheels) Gait Pattern/deviations: Decreased step length - right, Decreased step length - left, Decreased stride length Gait velocity: decreased     General Gait Details: Pt ambulated to door & back with RW & CGA<>close supervision, gait distance limited by L hip pain.   Stairs             Wheelchair Mobility    Modified Rankin (Stroke Patients Only)       Balance Overall balance assessment: Needs assistance Sitting-balance support: Feet supported Sitting balance-Leahy Scale: Good     Standing balance support: Single extremity supported, Bilateral upper extremity supported, During functional activity Standing balance-Leahy Scale: Fair                              Cognition Arousal/Alertness: Awake/alert Behavior During Therapy: WFL for tasks assessed/performed Overall Cognitive Status: Within Functional Limits for tasks assessed  General Comments: Very pleasant, motivated to participate, good safety awareness.        Exercises General Exercises - Lower Extremity Long Arc Quad: AROM, Strengthening, Both, 10 reps, Seated Hip Flexion/Marching: AROM, Seated, Strengthening, Both, 5 reps    General Comments        Pertinent Vitals/Pain Pain Assessment Pain Assessment: 0-10 Pain Score: 9  Pain  Location: L hip at end of session Pain Descriptors / Indicators: Grimacing, Guarding, Discomfort Pain Intervention(s): Monitored during session, Premedicated before session, Limited activity within patient's tolerance, Repositioned    Home Living                          Prior Function            PT Goals (current goals can now be found in the care plan section) Acute Rehab PT Goals Patient Stated Goal: return to home, continue to regain strength for eventual Left hip THA PT Goal Formulation: With patient Time For Goal Achievement: 07/19/22 Potential to Achieve Goals: Good Progress towards PT goals: Progressing toward goals    Frequency    Min 3X/week      PT Plan Current plan remains appropriate    Co-evaluation              AM-PAC PT "6 Clicks" Mobility   Outcome Measure  Help needed turning from your back to your side while in a flat bed without using bedrails?: A Lot Help needed moving from lying on your back to sitting on the side of a flat bed without using bedrails?: A Lot Help needed moving to and from a bed to a chair (including a wheelchair)?: A Little Help needed standing up from a chair using your arms (e.g., wheelchair or bedside chair)?: A Little Help needed to walk in hospital room?: A Little Help needed climbing 3-5 steps with a railing? : A Lot 6 Click Score: 15    End of Session   Activity Tolerance: Patient limited by pain Patient left: in chair;with call bell/phone within reach   PT Visit Diagnosis: Difficulty in walking, not elsewhere classified (R26.2);Other abnormalities of gait and mobility (R26.89);Muscle weakness (generalized) (M62.81);Pain Pain - Right/Left: Left Pain - part of body: Hip     Time: 1056-1110 PT Time Calculation (min) (ACUTE ONLY): 14 min  Charges:  $Therapeutic Activity: 8-22 mins                     Aleda Grana, PT, DPT 07/09/22, 11:14 AM   Sandi Mariscal 07/09/2022, 11:13 AM

## 2022-07-09 NOTE — TOC Progression Note (Signed)
Transition of Care [facility]) - Progression Note    Patient Details  Name: Nicole Austin MRN: [id number] Date of Birth: August 27, 1960  Transition of Care [facility]) CM/SW Contact  Marlowe Sax, RN Phone Number: 07/09/2022, 12:48 PM  Clinical Narrative:    [facility] Health Approved Ref number [id number] 5/13-5/15 To go to [facility]   Expected Discharge Plan: Skilled Nursing Facility Barriers to Discharge: Continued Medical Work up  Expected Discharge Plan and Services       Living arrangements for the past 2 months: Single Family Home                                       Social Determinants of Health (SDOH) Interventions SDOH Screenings   Food Insecurity: No Food Insecurity (07/04/2022)  Housing: Low Risk  (07/04/2022)  Transportation Needs: No Transportation Needs (07/04/2022)  Utilities: Not At Risk (07/04/2022)  Alcohol Screen: Low Risk  (03/21/2022)  Depression (PHQ2-9): High Risk (06/14/2022)  Financial Resource Strain: Low Risk  (04/25/2020)  Recent Concern: Financial Resource Strain - Medium Risk (03/17/2020)  Physical Activity: Inactive (03/21/2021)  Social Connections: Socially Isolated (03/21/2021)  Stress: Stress Concern Present (03/21/2021)  Tobacco Use: Medium Risk (07/04/2022)    Readmission Risk Interventions     No data to display

## 2022-07-09 NOTE — Care Management Important Message (Signed)
Important Message  Patient Details  Name: Nicole Austin MRN: [id number] Date of Birth: 1960-12-09   Medicare Important Message Given:  N/A - LOS <3 / Initial given by admissions     Nicole Austin 07/09/2022, 2:24 PM

## 2022-07-09 NOTE — TOC Progression Note (Signed)
Transition of Care [facility]) - Progression Note    Patient Details  Name: Sabeen Cassiano MRN: [id number] Date of Birth: [birth year]  Transition [facility]) CM/SW Contact  Marlowe Sax, RN Phone Number: 07/09/2022, 12:23 PM  Clinical Narrative:    Uploaded the therapy notes to [facility] for Ins   Expected Discharge Plan: Skilled Nursing Facility Barriers to Discharge: Continued Medical Work up  Expected Discharge Plan and Services       Living arrangements for the past 2 months: Single Family Home                                       Social Determinants of Health (SDOH) Interventions SDOH Screenings   Food Insecurity: No Food Insecurity (07/04/2022)  Housing: Low Risk  (07/04/2022)  Transportation Needs: No Transportation Needs (07/04/2022)  Utilities: Not At Risk (07/04/2022)  Alcohol Screen: Low Risk  (03/21/2022)  Depression (PHQ2-9): High Risk (06/14/2022)  Financial Resource Strain: Low Risk  (04/25/2020)  Recent Concern: Financial Resource Strain - Medium Risk (03/17/2020)  Physical Activity: Inactive (03/21/2021)  Social Connections: Socially Isolated (03/21/2021)  Stress: Stress Concern Present (03/21/2021)  Tobacco Use: Medium Risk (07/04/2022)    Readmission Risk Interventions     No data to display

## 2022-07-09 NOTE — Telephone Encounter (Signed)
Pt needs appointment

## 2022-07-09 NOTE — TOC Progression Note (Signed)
Transition of Care [facility]) - Progression Note    Patient Details  Name: Nicole Austin MRN: [id number] Date of Birth: Feb 18, 1961  Transition of Care [facility]) CM/SW Contact  Marlowe Sax, RN Phone Number: 07/09/2022, 10:09 AM  Clinical Narrative:    [facility] and requested Updated therapy notes, I messaged the PT and the physician to make aware that these are needed today   Expected Discharge Plan: Skilled Nursing Facility Barriers to Discharge: Continued Medical Work up  Expected Discharge Plan and Services       Living arrangements for the past 2 months: Single Family Home                                       Social Determinants of Health (SDOH) Interventions SDOH Screenings   Food Insecurity: No Food Insecurity (07/04/2022)  Housing: Low Risk  (07/04/2022)  Transportation Needs: No Transportation Needs (07/04/2022)  Utilities: Not At Risk (07/04/2022)  Alcohol Screen: Low Risk  (03/21/2022)  Depression (PHQ2-9): High Risk (06/14/2022)  Financial Resource Strain: Low Risk  (04/25/2020)  Recent Concern: Financial Resource Strain - Medium Risk (03/17/2020)  Physical Activity: Inactive (03/21/2021)  Social Connections: Socially Isolated (03/21/2021)  Stress: Stress Concern Present (03/21/2021)  Tobacco Use: Medium Risk (07/04/2022)    Readmission Risk Interventions     No data to display

## 2022-07-09 NOTE — Progress Notes (Signed)
Progress Note   Patient: Nicole Austin ZOX:[id number] DOB: [birth year] DOA: 07/04/2022     5 DOS: the patient was seen and examined on 07/09/2022   Brief hospital course: 62 year old female came in after a fall out of bed and was lying on the floor all night long.  She stated that her brother came over and heard her yelling and had to climb through a window to get her.  She was brought into the emergency room and found to be in rhabdomyolysis.  CK was 3039.  5/9.  Continue IV fluids.  CK down to 1873.  Will get PT and OT consultations. 5/10.  CK down to 979.  Will discontinue IV fluids.  PT and OT recommending rehab.  Patient having left knee pain today.  X-ray does not show any fracture. 5/11.  Patient still having left hip and left knee pain.  Transitional care team notified me that the patient does not want to go out to rehab.  Patient's sister will call her to discuss further. 5/12.  Patient having quite a bit of pain in the left hip and knee.  Patient agreeable to rehab.  Assessment and Plan: * Traumatic rhabdomyolysis (HCC) Fall out of bed and was lying on the floor all night long.  CK down to 279.  Hold Lipitor.  Obtain insurance authorization but rehab unable to take until tomorrow.   Left knee pain X-ray does not show any fracture.  Orthopedics recommends outpatient follow-up.  Chronic pain medications.  COPD (chronic obstructive pulmonary disease) (HCC) Slight wheeze.  Add budesonide and albuterol nebulizers, continue Spiriva.  Avascular necrosis (HCC) Bilateral hips.  On chronic pain medications.  Bipolar disorder with depression (HCC) Continue Latuda and Lexapro   Hyponatremia Sodium a few points less than the normal range.  Iron deficiency anemia Last hemoglobin 9.8.  Fall PT and OT recommending rehab.  Patient agreeable to rehab.  Obesity, Class III, BMI 40-49.9 (morbid obesity) (HCC) BMI 43.46        Subjective: Patient again seen sitting in the  chair.  She stated she walked in the room a little bit with staff.  Still having quite a bit of pain in her hip and knee.  Admitted after a fall and found to have rhabdomyolysis  Physical Exam: Vitals:   07/08/22 0838 07/08/22 1547 07/09/22 0105 07/09/22 0823  BP: (!) 118/56 118/71 (!) 138/52 136/61  Pulse: (!) 59 73 68 71  Resp:   16 16  Temp:  98.3 F (36.8 C) 98.1 F (36.7 C) 97.8 F (36.6 C)  TempSrc:      SpO2: 94% 95% 94% 94%  Weight:      Height:       Physical Exam HENT:     Head: Normocephalic.     Mouth/Throat:     Pharynx: No oropharyngeal exudate.  Eyes:     General: Lids are normal.     Conjunctiva/sclera: Conjunctivae normal.  Cardiovascular:     Rate and Rhythm: Normal rate and regular rhythm.     Heart sounds: Normal heart sounds, S1 normal and S2 normal.  Pulmonary:     Breath sounds: No decreased breath sounds, wheezing, rhonchi or rales.  Abdominal:     Palpations: Abdomen is soft.     Tenderness: There is no abdominal tenderness.  Musculoskeletal:     Left knee: Swelling and bony tenderness present. Decreased range of motion.     Right lower leg: Swelling present.     Left lower  leg: Swelling present.  Skin:    General: Skin is warm.     Findings: No rash.  Neurological:     Mental Status: She is alert and oriented to person, place, and time.     Comments: Seen while sitting in the chair.  Able to lift bilateral knees up off the chair.  Slight weakness when trying to keep the left leg straight out.     Data Reviewed: Last sodium 131  Family Communication: Updated sister on the phone  Disposition: Status is: Inpatient Remains inpatient appropriate because: Obtained insurance authorization but the rehab facility will be unable to take until tomorrow.  Planned Discharge Destination: Rehab    Time spent: 27 minutes  Author: Alford Highland, MD 07/09/2022 1:13 PM  For on call review [email or web address].

## 2022-07-09 NOTE — Progress Notes (Unsigned)
Pt inpt currently Appt will be rescheduled  Danelle Berry, PA-C

## 2022-07-10 DIAGNOSIS — J441 Chronic obstructive pulmonary disease with (acute) exacerbation: Secondary | ICD-10-CM | POA: Diagnosis not present

## 2022-07-10 DIAGNOSIS — G8929 Other chronic pain: Secondary | ICD-10-CM | POA: Diagnosis not present

## 2022-07-10 DIAGNOSIS — Z7401 Bed confinement status: Secondary | ICD-10-CM | POA: Diagnosis not present

## 2022-07-10 DIAGNOSIS — D508 Other iron deficiency anemias: Secondary | ICD-10-CM | POA: Diagnosis not present

## 2022-07-10 DIAGNOSIS — M6282 Rhabdomyolysis: Secondary | ICD-10-CM | POA: Diagnosis not present

## 2022-07-10 DIAGNOSIS — W19XXXA Unspecified fall, initial encounter: Secondary | ICD-10-CM | POA: Diagnosis not present

## 2022-07-10 DIAGNOSIS — R2689 Other abnormalities of gait and mobility: Secondary | ICD-10-CM | POA: Diagnosis not present

## 2022-07-10 DIAGNOSIS — M8700 Idiopathic aseptic necrosis of unspecified bone: Secondary | ICD-10-CM | POA: Diagnosis not present

## 2022-07-10 DIAGNOSIS — Z743 Need for continuous supervision: Secondary | ICD-10-CM | POA: Diagnosis not present

## 2022-07-10 DIAGNOSIS — R6889 Other general symptoms and signs: Secondary | ICD-10-CM | POA: Diagnosis not present

## 2022-07-10 DIAGNOSIS — R799 Abnormal finding of blood chemistry, unspecified: Secondary | ICD-10-CM | POA: Diagnosis not present

## 2022-07-10 DIAGNOSIS — R296 Repeated falls: Secondary | ICD-10-CM | POA: Diagnosis not present

## 2022-07-10 DIAGNOSIS — R2681 Unsteadiness on feet: Secondary | ICD-10-CM | POA: Diagnosis not present

## 2022-07-10 DIAGNOSIS — E871 Hypo-osmolality and hyponatremia: Secondary | ICD-10-CM | POA: Diagnosis not present

## 2022-07-10 DIAGNOSIS — M25562 Pain in left knee: Secondary | ICD-10-CM | POA: Diagnosis not present

## 2022-07-10 DIAGNOSIS — M87059 Idiopathic aseptic necrosis of unspecified femur: Secondary | ICD-10-CM | POA: Diagnosis not present

## 2022-07-10 DIAGNOSIS — T796XXS Traumatic ischemia of muscle, sequela: Secondary | ICD-10-CM | POA: Diagnosis not present

## 2022-07-10 DIAGNOSIS — M25559 Pain in unspecified hip: Secondary | ICD-10-CM | POA: Diagnosis not present

## 2022-07-10 DIAGNOSIS — M6281 Muscle weakness (generalized): Secondary | ICD-10-CM | POA: Diagnosis not present

## 2022-07-10 DIAGNOSIS — R609 Edema, unspecified: Secondary | ICD-10-CM | POA: Diagnosis not present

## 2022-07-10 DIAGNOSIS — W19XXXS Unspecified fall, sequela: Secondary | ICD-10-CM | POA: Diagnosis not present

## 2022-07-10 DIAGNOSIS — I10 Essential (primary) hypertension: Secondary | ICD-10-CM | POA: Diagnosis not present

## 2022-07-10 DIAGNOSIS — J449 Chronic obstructive pulmonary disease, unspecified: Secondary | ICD-10-CM | POA: Diagnosis not present

## 2022-07-10 DIAGNOSIS — Z9181 History of falling: Secondary | ICD-10-CM | POA: Diagnosis not present

## 2022-07-10 LAB — BASIC METABOLIC PANEL
Anion gap: 6 (ref 5–15)
BUN: 19 mg/dL (ref 8–23)
CO2: 29 mmol/L (ref 22–32)
Calcium: 9 mg/dL (ref 8.9–10.3)
Chloride: 97 mmol/L — ABNORMAL LOW (ref 98–111)
Creatinine, Ser: 0.92 mg/dL (ref 0.44–1.00)
GFR, Estimated: >60 mL/min (ref >60)
Glucose, Bld: 92 mg/dL (ref 70–99)
Potassium: 4.5 mmol/L (ref 3.5–5.1)
Sodium: 132 mmol/L — ABNORMAL LOW (ref 135–145)

## 2022-07-10 LAB — HEMOGLOBIN: Hemoglobin: 10.1 g/dL — ABNORMAL LOW (ref 12.0–15.0)

## 2022-07-10 MED ORDER — POLYETHYLENE GLYCOL 3350 17 G PO PACK: 17.0000 g | PACK | Freq: Every day | ORAL | 0 refills | Status: DC

## 2022-07-10 MED ORDER — ALBUTEROL SULFATE (2.5 MG/3ML) 0.083% IN NEBU
2.5000 mg | INHALATION_SOLUTION | Freq: Three times a day (TID) | RESPIRATORY_TRACT | Status: DC
  Administered 2022-07-10: 2.5 mg via RESPIRATORY_TRACT
  Filled 2022-07-10: qty 3

## 2022-07-10 MED ORDER — OXYCODONE HCL 10 MG PO TABS: 10.0000 mg | ORAL_TABLET | Freq: Four times a day (QID) | ORAL | 0 refills | Status: DC | PRN

## 2022-07-10 MED ORDER — ACETAMINOPHEN 500 MG PO TABS
1000.0000 mg | ORAL_TABLET | Freq: Three times a day (TID) | ORAL | 0 refills | Status: DC | PRN
Start: 2022-07-10 — End: 2022-12-25

## 2022-07-10 MED ORDER — LURASIDONE HCL 60 MG PO TABS: 60.0000 mg | ORAL_TABLET | Freq: Every day | ORAL | 0 refills | Status: DC

## 2022-07-10 MED ORDER — ALPRAZOLAM 0.5 MG PO TABS: 0.5000 mg | ORAL_TABLET | Freq: Two times a day (BID) | ORAL | 0 refills | Status: AC

## 2022-07-10 MED ORDER — CYCLOBENZAPRINE HCL 5 MG PO TABS: 5.0000 mg | ORAL_TABLET | Freq: Three times a day (TID) | ORAL | 0 refills | Status: DC | PRN

## 2022-07-10 NOTE — TOC Progression Note (Signed)
Transition of Care [facility]) - Progression Note    Patient Details  Name: Nicole Austin MRN: [id number] Date of Birth: 02-Sep-1960  Transition of Care [facility]) CM/SW Contact  Marlowe Sax, RN Phone Number: 07/10/2022, 11:03 AM  Clinical Narrative:   I called the patients sister [name] at her request [phone]) no answer, left a VM for a return call    Expected Discharge Plan: Skilled Nursing Facility Barriers to Discharge: Continued Medical Work up  Expected Discharge Plan and Services       Living arrangements for the past 2 months: Single Family Home Expected Discharge Date: 07/10/22                                     Social Determinants of Health (SDOH) Interventions SDOH Screenings   Food Insecurity: No Food Insecurity (07/04/2022)  Housing: Low Risk  (07/04/2022)  Transportation Needs: No Transportation Needs (07/04/2022)  Utilities: Not At Risk (07/04/2022)  Alcohol Screen: Low Risk  (03/21/2022)  Depression (PHQ2-9): High Risk (06/14/2022)  Financial Resource Strain: Low Risk  (04/25/2020)  Recent Concern: Financial Resource Strain - Medium Risk (03/17/2020)  Physical Activity: Inactive (03/21/2021)  Social Connections: Socially Isolated (03/21/2021)  Stress: Stress Concern Present (03/21/2021)  Tobacco Use: Medium Risk (07/04/2022)    Readmission Risk Interventions     No data to display

## 2022-07-10 NOTE — Progress Notes (Signed)
Reviewed discharge summary  Of note -patient is not on any chronic pain management She had chronic low back pain that started to worsen and she consulted with Ortho back in Dec/Jan .  Avascular necrosis of her hips was found but did not appear acute it appears chronic and the initial orthopedist only gave her a few pain med prescriptions and then her surgery was rescheduled due to her taking anticoagulants incorrectly the day of surgery -then Dr. Odis Luster was no longer able to do her surgery.  She then established with [facility] orthopedic surgeon who just recently discharged her because she was lying to them and she had been seeking narcotics.  He never prescribed any narcotic pain medicine for her and that was never in his plan for surgery and was specifically a point of contention in his documentation.  He even called our office to confirm that we were not prescribing her narcotic pain medicine which we never have. I have been concerned and discussed this with my supervising physician I have put notes on the chart that are visible by any provider seeing the patient that she should not get any pain meds she is not on pain meds with primary care or any pain management, she has daily benzodiazepines, history of narcotic and polysubstance abuse documented in this EMR in the past.  I am concerned enough that I will be reporting to her pharmacy and the [facility] medical board that this patient is extremely high risk with history of abuse daily benzos and she continues to get multiple narcotic med refills from various ERs. She has had ample opportunity to complete her surgeries to definitely treat her new pain,  but she has found many ways to have problems, acute issues, med mistakes, and even gone to the ER instead of proceeding with surgery.  She has been discharged by 2 surgeons this year due to noncompliance and most recently with concerns related to pain meds.    State Indicators (1) !! >= 5 opioid or  sedative providers in any year in the last 2 years Overlapping Benzo - narcotics Multiple pharmacies Multiple prescribers  Benzos per psychiatry Dr. Janeece Riggers   RX Summary Summary Total Prescriptions 48  Total Private Pay 1  Total Prescribers 18  Total Pharmacies 4  Narcotics (excluding Buprenorphine) Current MME/day 0.00  30 Day Avg MME/day 6.25  Current Qty 0  Buprenorphine Current mg/day 0.00  30 Day Avg mg/day 0.00  Current Qty 0   RX Summary Expanded Narcotics (excluding Buprenorphine) Current MME/day 0.00  30 Day Avg MME/day 6.25  90 Day Avg MME/day 29.75  Rx Count/12 Months 21  Prescriber #/6 Months 12  Pharmacy #/6 Months 3  Current Qty 0  Buprenorphine Current mg/day 0.00  30 Day Avg mg/day 0.00  90 Day Avg mg/day 0.00  Rx Count/12 Months 0  Prescriber #/6 Months 0  Pharmacy #/6 Months 0  Current Qty 0  Sedatives 30 Day Avg LME/day 4.13  90 Day Avg LME/day 4.22  Rx Count/12 Months 14  Prescriber #/6 Months 1  Pharmacy #/6 Months 1  Current Qty 60  Stimulants 30 Day Avg mg/day 0.00  90 Day Avg mg/day 0.00  Rx Count/12 Months 0  Prescriber #/6 Months 0  Pharmacy #/6 Months 0  Current Qty 0   Prescriptions Total: 48  Private Pay: 1 Showing 1-48 of 48 Items View 100 Items  1 of 1  Filled  Written  Drug  QTY  Days  Prescriber  Dispenser  Refill  Daily Dose*   06/26/2022 05/16/2022 Alprazolam 0.5 Mg Tablet 120.00 30 Ha Su Gib (4800) 1/4 4.00 LME  06/20/2022 06/20/2022 Oxycodone Hcl (Ir) 5 Mg Tablet 15.00 4 Br Hen Wal (8206) 0/0 28.13 MME  06/14/2022 06/14/2022 Hydrocodone-Acetamin 5-325 Mg 15.00 5 Je Bac Wal (0202) 0/0 15.00 MME  06/05/2022 06/05/2022 Oxycodone Hcl (Ir) 10 Mg Tab 20.00 5 Da Loralee Pacas (1610) 0/0 60.00 MME  05/28/2022 05/16/2022 Alprazolam 0.5 Mg Tablet 120.00 30 Ha Su Gib (4800) 0/4 4.00 LME  05/22/2022 05/22/2022 Oxycodone Hcl (Ir) 10 Mg Tab 20.00 5 Sh Mum Wal (0202) 0/0 60.00 MME  05/17/2022 05/17/2022 Oxycodone Hcl (Ir) 10 Mg Tab 15.00 4  Su Fis Gib (4800) 0/0 56.25 MME  05/11/2022 05/11/2022 Oxycodone Hcl (Ir) 10 Mg Tab 15.00 3 Da Loralee Pacas (9604) 0/0 75.00 MME  05/01/2022 04/30/2022 Oxycodone Hcl (Ir) 10 Mg Tab 24.00 6 Si Won Wal (0202) 0/0 60.00 MME  04/30/2022 03/14/2022 Alprazolam 0.5 Mg Tablet 120.00 30 Ha Su Gib (4800) 0/2 4.00 LME  04/23/2022 04/23/2022 Oxycodone Hcl (Ir) 10 Mg Tab 30.00 5 Ma Remi Deter (0202) 0/0 90.00 MME  04/16/2022 04/16/2022 Oxycodone Hcl (Ir) 10 Mg Tab 30.00 5 Ma Remi Deter (0202) 0/0 90.00 MME  04/09/2022 04/09/2022 Oxycodone Hcl (Ir) 10 Mg Tab 30.00 5 Ma Remi Deter (0202) 0/0 90.00 MME  04/02/2022 12/20/2021 Alprazolam 0.5 Mg Tablet 120.00 30 Ha Su Gib (4800) 2/2 4.00 LME  03/26/2022 03/26/2022 Oxycodone Hcl (Ir) 5 Mg Tablet 30.00 5 Ma Jon Gib (4800) 0/0 45.00 MME  03/26/2022 03/19/2022 Tramadol Hcl 50 Mg Tablet 20.00 5 Ja Bow Gib (4800) 1/1 40.00 MME  03/25/2022 03/24/2022 Tramadol Hcl 50 Mg Tablet 4.00 1 Am Fer Wal (0202) 0/1 40.00 MME  03/19/2022 03/19/2022 Tramadol Hcl 50 Mg Tablet 20.00 5 Ja Bow Gib (4800) 0/1 40.00 MME  03/16/2022 03/16/2022 Oxycodone Hcl (Ir) 5 Mg Tablet 10.00 3 Ma Bab Gib (4800) 0/0 25.00 MME  03/14/2022 03/13/2022 Oxycodone-Acetaminophen 5-325 10.00 3 Pa Rob Gib (4800) 0/0 25.00 MME  03/09/2022 03/08/2022 Oxycodone-Acetaminophen 5-325 12.00 3 Ja Woo Gib (4800) 0/0 30.00 MME  03/02/2022 12/20/2021 Alprazolam 0.5 Mg Tablet 120.00 30 Ha Su Gib (4800) 1/2 4.00 LME  01/31/2022 12/20/2021 Alprazolam 0.5 Mg Tablet 120.00 30 Ha Su Gib (4800) 0/2 4.00 LME  01/01/2022 10/18/2021 Alprazolam 0.5 Mg Tablet 120.00 30 Ha Su Gib (4800) 2/2 4.00 LME  12/01/2021 10/18/2021 Alprazolam 0.5 Mg Tablet 120.00 30 Ha Su      Pt was on no pain meds when she started to have hip and leg pain Dec to Jan 2023 to 2024. She saw me, ortho, spine and was sent to the ED for further eval.  MRI ordered did not show any low back etiology and eventually in the hospital she got MR hip left which showed the AVN  This year  pain meds initiated in the ED 1/12 and 1/17 First ortho Rx is from [facility] on 1/19 Surgeon Dr. Odis Luster only did tramadol on 1/22 and 1/29 trying to get her to surgery which was scheduled on 03/28/2022 - but cancelled due to pt taking xarelto on 1/30 Somehow another [facility] provider also sent in oxy on 1/29 and then did weekly refills Altamese Cabal PA [facility] 1/29, 2/12, 2/19, 2/26 Meanwhile she had ED visit for polypharmacy on 2/12 - weakness, confusion, AMS drowsiness admitted   Once pt was d/c from [facility] and then established with [facility] she started to go to the  ER often, even weekly, stating she "ran out of her oxycodone when she switched orthopedics providers" however the new ortho NEVER gave rx for pain meds - after establishing 03/30/2022 with Dr. Audelia Acton  Initial consult with Dr. Audelia Acton: Audelia Acton, Barbra Sarks, MD - 03/30/2022 2:00 PM EST Formatting of this note is different from the original.  History of Present Illness: Marni Klaas is an 62 y.o. female who presents for evaluation of her left hip. The patient reports a 11-month history of pain in her left groin which came on insidiously without any known trauma or injury. She does report that she is been treated in the past with steroids for her COPD but none recently. She reports the pain was causing constant throbbing and sharp stabs which were preventing her from walking and performing activities of daily living and was evaluated by another orthopedic surgeon and found to have AVN of her left hip with severe arthritic change. The patient was scheduled to undergo a left anterior total hip replacement a few days ago and had been sent her medications preoperatively. On the day of surgery when she went to take her normal morning medications she accidentally took a dose of her postoperative Xarelto and her surgery was canceled. Her surgeon is unable to rebook her as he will no longer be operating in this county  starting at the end of the month. The patient was referred here for evaluation for possible left hip replacement. The patient denies fevers, chills, numbness, tingling, shortness of breath, chest pain, recent illness, or any trauma.  The patient reports she quit smoking for her hip replacement on 03/19/2022. The patient does have an elevated BMI of 42.9 but she has been losing weight and reports she will continue to lose weight prior to surgery. The patient's last hemoglobin A1c was 5.8 and she is not diabetic  Medical records from [facility] were reviewed as well as MRI and x-rays from [facility] health system.  Assessment:  Left hip avascular necrosis  Plan: Prachi is a 62year old female who presents with left hip avascular necrosis with subchondral collapse and focal right hip AVN which appears chronic in nature with no right hip symptoms at this time. Based upon the patient's continued symptoms and failure to respond to conservative treatments and the severe nature of her pain and AVN in the left hip, I have recommended a left total hip replacement for this patient. A long discussion took place with the patient describing what a total joint replacement is and what the procedure would entail. A hip model, similar to the implants that will be used during the operation, was utilized to demonstrate the implants. Choices of implant manufactures were discussed and reviewed. The ability to secure the implant utilizing cement or cementless (press fit) fixation was discussed. Anterior and posterior exposures were discussed. For this patient an appropriate approach will be anterior.  The hospitalization and post-operative care and rehabilitation were also discussed. The use of perioperative antibiotics and DVT prophylaxis were discussed. The risk, benefits and alternatives to a surgical intervention were discussed at length with the patient. The patient was also advised of risks related to the medical  comorbidities and elevated body mass index (BMI). A lengthy discussion took place to review the most common complications including but not limited to: deep vein thrombosis, pulmonary embolus, heart attack, stroke, infection, wound breakdown, heterotopic ossification, dislocation, numbness, leg length in-equality, intraoperative fracture, damage to nerves, tendon,muscles, arteries or other blood vessels, death and other possible complications  from anesthesia. The patient was told that we will take steps to minimize these risks by using sterile technique, antibiotics and DVT prophylaxis when appropriate and follow the patient postoperatively in the office setting to monitor progress. The possibility of recurrent pain, no improvement in pain and actual worsening of pain were also discussed with the patient. The risk of dislocation following total hip replacement was discussed and potential precautions to prevent dislocation were reviewed.   The discharge plan of care focused on the patient going home following surgery. The patient was encouraged to make the necessary arrangements to have someone stay with them when they are discharged home.   The benefits of surgery were discussed with the patient including the potential for improving the patient's current clinical condition through operative intervention. Alternatives to surgical intervention including continued conservative management were also discussed in detail. All questions were answered to the satisfaction of the patient. The patient participated and agreed to the plan of care as well as the use of the recommended implants for their total hip replacement surgery. An information packet was given to the patient to review prior to surgery.  We specifically had a conversation about her weight and her increased risks regarding her BMI she has lost weight and will continue losing weight and I advised the patient that we will only proceed if she has continued to  lose weight at her next preoperative visit prior to surgery. We also discussed that she will need to maintain her smoking cessation for at least 6 weeks prior to surgery and she will be tested for nicotine prior to proceeding with surgery. We will obtain new films with a marker ball at her presurgical visit for templating purposes. Patient will need medical clearance for the surgery. All questions answered and the patient agrees with the above plan to proceed with preparation for a left anterior total hip replacement.   04/06/2022 telephone encounter with ortho:  she called and ask the surgeon for pain management and he sent in celebrex only - NOT ON PAIN MEDS per surgeon:   Left THA- 05/17/22- [place]  Patient called in a lot of pain and is demanding oxycodone be sent to pharmacy. [facility] had been refilling medication since she was initially scheduled for hip surgery with them. They have stopped approving refills. She states Celebrex did not help.  Ordered Prescriptions - documented in this encounter Ordered Prescriptions Prescription Sig Dispensed Refills Start Date End Date  celecoxib (CELEBREX) 200 MG capsule  Indications: Avascular necrosis of bone of left hip (CMS/HHS-HCC) Take 1 capsule (200 mg total) by mouth 2 (two) times daily for 60 days 30 capsule  1 04/06/2022 06/05/2022      Telephone Encounter - Len Childs, CMA - 04/27/2022 9:42 AM EST Formatting of this note might be different from the original. Left THA- 05/17/22- [place]  Patient called in a lot of pain and is demanding oxycodone be sent to pharmacy. [facility] had been refilling medication since she was initially scheduled for hip surgery with them. They have stopped approving refills. She states Celebrex did not help.   Electronically signed by Len Childs, CMA at 04/27/2022 9:49 AM EST  Back to top of Miscellaneous Notes Telephone Encounter - Coralee Pesa, CMA - 04/06/2022 2:51 PM EST Formatting of this note might be  different from the original. Pt contacted and advised per Dr. Audelia Acton and stated she understands. Electronically signed by Coralee Pesa, CMA at 04/06/2022 2:51 PM EST  Back to top of Miscellaneous Notes  Telephone Encounter - Len Childs, CMA - 04/06/2022 1:04 PM EST Formatting of this note might be different from the original. Informed patient of message and she would like anti-inflammatory medication to be sent to pharmacy  [facility] Electronically signed by Len Childs, CMA at 04/06/2022 1:05 PM EST  Back to top of Miscellaneous Notes Telephone Encounter - Len Childs, CMA - 04/06/2022 9:53 AM EST Formatting of this note might be different from the original. Patient states she is experiencing a lot of pain radiating from her hip to her knee. Previously using a walker but for the last couple days having trouble doing that. Ran out of oxycodone and tramadol couple days ago. Has been taking ibuprofen, tylenol, and tizanidine and using ice. Electronically signed by Len Childs, CMA at 04/06/2022 9:58 AM EST  Back to top of Miscellaneous Notes Telephone Encounter - Len Childs, CMA - 04/06/2022 9:53 AM EST Formatting of this note might be different from the original. ----- Message from [facility] sent at 04/06/2022 8:29 AM EST ----- Regarding: req call Contact: [phone] P stated she is in a lot of pain and wants to know what she should do  Electronically signed by Len Childs, CMA at 04/06/2022 9:53 AM EST  Subsequent phone calls from pt in care everywhere 05/09/2022:  Telephone Encounter - Coralee Pesa, CMA - 05/09/2022 3:26 PM EDT Formatting of this note might be different from the original. Called and informed pt informed that we pulled up the [facility] Narcotic Registry and we could see that she is getting pains meds from other doctors. I informed her that with her surgery being next week we are not willing to give her any pain meds at this time  because Dr. Audelia Acton doesn't like to give Narcotics prior to surgery. Pt stated that she was in a lot of pain and she can't sleep. Pt said that she would just go to the ER. I did inform Dr. Audelia Acton he will discuss further at her appointment on Friday. Electronically signed by Coralee Pesa, CMA at 05/09/2022 3:30 PM EDT  Back to top of Miscellaneous Notes Telephone Encounter - Coralee Pesa, CMA - 05/09/2022 3:04 PM EDT Formatting of this note might be different from the original. [email or web address] Electronically signed by Coralee Pesa, CMA at 05/09/2022 3:04 PM EDT  Back to top of Miscellaneous Notes Telephone Encounter - Coralee Pesa, CMA - 05/09/2022 12:02 PM EDT Formatting of this note might be different from the original. Images from the original note were not included.  Electronically signed by Coralee Pesa, CMA at 05/09/2022 12:03 PM EDT   Telephone Encounter - Coralee Pesa, CMA - 05/09/2022 12:02 PM EDT Formatting of this note might be different from the original. ----- Message from Lenard Forth sent at 05/09/2022 9:38 AM EDT ----- Regarding: med request Contact: [phone] Patient calling and wants to know can Thayer Ohm prescribe her some pain med's?  [place] [facility]   Phone call the following day 05/10/2022 to [facility] ortho through care everywhere:  Telephone Encounter - Coralee Pesa, CMA - 05/10/2022 9:53 AM EDT Formatting of this note might be different from the original. Called pt back informed that if she felt like she was withdrawing she would need to go to the ER because we are not a pain management. When I spoke to her this time she was a lot comer was not crying or breathing hard at all. She told me that she had her brother-in-law go get her a non-nicotine vap and gave the one with  nicotine in it to her sister. I informed she would need to discuss tomorrow with Dr. Audelia Acton.  Electronically signed by Coralee Pesa, CMA at 05/10/2022 9:57 AM EDT  Back to top of Miscellaneous  Notes Telephone Encounter - Coralee Pesa, CMA - 05/10/2022 9:28 AM EDT Formatting of this note might be different from the original. Spoke with [facility] they have not been giving her any meds she said it looks like she has been getting all of them from the ER. Electronically signed by Coralee Pesa, CMA at 05/10/2022 9:37 AM EDT  Back to top of Miscellaneous Notes Telephone Encounter - Coralee Pesa, CMA - 05/10/2022 8:52 AM EDT Formatting of this note might be different from the original. Pt called crying stating she can't eat, she can't sleep she can't take the pain any longer she said she needs help. She said she is scared she has been vaping that has nicotine in it. She is crying very loudly begging for you not to push the surgery out. She said she is use to having pain meds , I asked her does she feel like she is addictive to the pain meds because she is breathing very hard, wondering if she is having withdrawals from it. Pt said she maybe withdrawing because she has never felt like this before and has been taking pain meds for a long time. Please advise what needs to be done. Electronically signed by Coralee Pesa, CMA at 05/10/2022 9:22 AM EDT   Pt was then discharged on 05/11/2022:  Telephone Encounter - Len Childs, CMA - 05/14/2022 10:27 AM EDT Formatting of this note might be different from the original. Letter with 3 alternate orthopaedic offices has been written. Patient made aware and requested letter is sent by mail. Letter placed in outgoing mail. Electronically signed by Len Childs, CMA at 05/14/2022 10:28 AM EDT  Back to top of Miscellaneous Notes Telephone Encounter - Aram Candela, MD - 05/11/2022 2:13 PM EDT Formatting of this note might be different from the original. I called and spoke with the patient today about the current plan for her care. I reviewed the chart and medical records from care everywhere and our own internal records as well as  discussed the case with our office manager, nursing staff, telephone staff, and my PA. I also reviewed the records from [facility]. Based on these conversations the patient has been calling multiple times requesting pain medications and has several times given misinformation to staff about her surgery, plans regarding her medications, and dates of surgery and about her use of pain medications. She would then request refills on pain medications during these phone calls and conversations. I discussed all of this with the patient and talked about the importance of trust before moving forward with any sort of surgical interventions for her. The patient does have a bad hip and does need a hip replacement however at this time I do not feel comfortable moving forward with her surgery or continuing to have her as a patient in our practice. I will provide the patient with a referral to pain management for further care of her ongoing urgent pain needs and we will also supply her in writing with the names of 3 alternative orthopedic surgeons who may be available to provide care to her for her hip. Electronically signed by Aram Candela, MD at 05/11/2022 2:19 PM EDT   Dr. Audelia Acton did referral to pain management on 3/15  Pt presented to the ER  on 3/15 at 17:13 for hip pain- this time she presented to [facility] and not [facility]:  Arrival date & time: 05/11/22  1650     History      Chief Complaint  Patient presents with   Hip Pain      Lizette Goldwater is a 62 y.o. female history of avascular necrosis of the femoral neck requiring hip replacement here presenting bilateral hip pain.  Patient states that her hip surgery got canceled twice.  She was supposed to happen next week and for some reason it got canceled.  She states that she was told that she needs to stop smoking before she gets the hip surgery.  She was trying to vape instead.  She states that she ran out of her pain medicine  several days ago.  Patient states that she wants another opinion from another orthopedic doctor.  She is frustrated about her pain situation.  Of note patient has several ED visits for pain related issues about her hip.  Patient uses a walker and a wheelchair at home.   She was given another Rx for oxycodone and a dose of pain meds while in the ED and she was referred to Dr. Roda Shutters for another ortho consult/"second opinion"  05/17/22 ER visit for hip pain back to [facility]  - given fentanyl and Oxy in the ER, note states she had [facility] f/up the next day and she was given another Rx for oxy  05/22/22 ER visit [facility] ED:  Pt co of left hip pain due to a chronic issue. Pt states she was supposed to have hip surgery 2 times but each have been re-scheduled. Pt states she ran out of her oxycodone 10mg  last night. Pt is A&Ox4. Pt is able to stand with staff assist x 2.     PER ER note and provider:  On chart review patient had a recent CT scan of the pelvis without contrast that showed avascular necrosis of bilateral hips.  They discussed with Dr. Joice Lofts with orthopedics who recommended outpatient follow-up.  Patient has a scheduled surgery in 1 month.   On review of PMP patient has received pain medication through the emergency department but has not been prescribed anything through primary care provider.  10 mg of oxycodone has been prescribed in the past.   Differential diagnosis including musculoskeletal, acute on chronic hip pain, avascular necrosis.  No concern for cauda equina or epidural compression syndrome.  Do not feel that repeat imaging is necessary at this time.     Given p.o. oxycodone   Do a short course of oxycodone to go home with.  Patient has not a scheduled follow-up appointment with pain clinic and orthopedics.  Encouraged to call her primary care physician discussed that we would no longer be giving any narcotic pain medication through the emergency department.  Given return precautions  for any worsening symptoms.  Pt did not come into Primary care office or ortho that I can see after that - phone call/nurse call on 4/8 with [facility] left knee pain, then 06/05/22 ED and EMS  06/05/22 ER HPI:  Hanni Aloia is a 62 y.o. female history of avascular necrosis of the hip, chronic pain, here presenting with fall and needing refill for pain meds.  Patient was seen by me several weeks ago.  She states that she was followed up with Ortho and her hip replacement was cancelled.  She states that 3 to 4 days ago she had a  fall and landed on the right hip.  She states that she ran out of her oxycodone and now is just taking Tylenol for pain   She was given more oxy on 06/05/2022 per ED  ED visit on 4/13 and 4/16 for rash/skin infection, reported pain 20 F/up in PCP office on 4/18 - sent to ED per pt request 4/24 f/up in PCP office - pt did not follow instructions and took all medications per Dr. Caralee Ates incorrectly SAME DAY 4/24 ED visit at [facility] long ED for hip pain/back pain - given more oxy when d/c from ER  07/04/2022 EMS called by pt family member, fall/on ground, taken to [facility] ED, admitted 07/04/2022-07/10/2022 for traumatic rhabdomyolysis - d/c to rehab  Per discharge summary pt has "chronic pain meds" but this is incorrect and concerning that I have documented mutlple times in the chart and so did Dr. Audelia Acton that pt was not on chronic pain meds and should not be.  She was referred to multiple ortho's but continues to give history that "surgery got cancelled for some reason"  when she knows it was due to her narcotic seeking despite recommendations and efforts of ortho surgeon and PCP.     Unfortunately this patient has known substance abuse - documented substance abuse with prescription pain meds in the past and is on high dose Xanax QID per psychiatry and she still received 320 narcotic pilsl this year.    Back to 2016 there is note of polysubstance abuse resulting in AMS, ER visits and  admissions to the hospital:  Loleta Rose, MD  Physician Emergency Medicine   ED Provider Notes Signed   Date of Service: 07/13/2014  5:54 PM   Signed     [facility] Emergency Department Provider Note         Jenee Raz is a 62 y.o. female history of avascular necrosis of the hip, chronic pain, here presenting with fall and needing refill for pain meds.  Patient was seen by me several weeks ago.  She states that she was followed up with Ortho and her hip replacement was cancelled.  She states that 3 to 4 days ago she had a fall and landed on the right hip.  She states that she ran out of her oxycodone and now is just taking Tylenol for pain    Prior PCP here refused to rx pain meds - Dr. Sherie Don:  Sherie Don, Janit Bern, MD  Physician Specialty: Family Medicine   Assessment & Plan Note Written   Encounter Date: 06/21/2015   Related Problems  Low back pain with sciatica       Written     I will be willing to prescribe naproxen 375 mg BID and continue her lyrica as already written, but will not prescribe her any narcotic pain medicine; see note re: polysubstance use; if she decides she would like to see a pain clinic for her back pain, let me know and we'll make that referral        Electronically signed by Kerman Passey, MD at 06/21/2015  5:44 PM   UDS 2/12 was neg for opioids I did UDS at preop appt in March - positive for opioids, benzos and marijuana

## 2022-07-10 NOTE — TOC Progression Note (Signed)
Transition of Care [facility]) - Progression Note    Patient Details  Name: Nicole Austin MRN: [id number] Date of Birth: Jul 18, 1960  Transition of [facility]) CM/SW Contact  Marlowe Sax, RN Phone Number: 07/10/2022, 1:24 PM  Clinical Narrative:    [name] called back and her and the patient want the patient to use EMS even if they will be charged, the patient does not ffel safe in a care   Expected Discharge Plan: Skilled Nursing Facility Barriers to Discharge: Continued Medical Work up  Expected Discharge Plan and Services       Living arrangements for the past 2 months: Single Family Home Expected Discharge Date: 07/10/22                                     Social Determinants of Health (SDOH) Interventions SDOH Screenings   Food Insecurity: No Food Insecurity (07/04/2022)  Housing: Low Risk  (07/04/2022)  Transportation Needs: No Transportation Needs (07/04/2022)  Utilities: Not At Risk (07/04/2022)  Alcohol Screen: Low Risk  (03/21/2022)  Depression (PHQ2-9): High Risk (06/14/2022)  Financial Resource Strain: Low Risk  (04/25/2020)  Recent Concern: Financial Resource Strain - Medium Risk (03/17/2020)  Physical Activity: Inactive (03/21/2021)  Social Connections: Socially Isolated (03/21/2021)  Stress: Stress Concern Present (03/21/2021)  Tobacco Use: Medium Risk (07/04/2022)    Readmission Risk Interventions     No data to display

## 2022-07-10 NOTE — TOC Progression Note (Signed)
Transition of Care [facility]) - Progression Note    Patient Details  Name: Nicole Austin MRN: [id number] Date of Birth: 1961/02/20  Transition of Care [facility]) CM/SW Contact  Marlowe Sax, RN Phone Number: 07/10/2022, 1:14 PM  Clinical Narrative:     Spoke with the patients sister [name] on the phone I explained to her that EMS is not likely to cover the EMS to go to rehab She is able to get to a chair and staff can assist in getting into a wheelchair then the facility will assist to get out off the car into a wheelchair, She stated understanding an said she has a low riding car I explained that possibly they could use a friends care etc, Ins looks at the reason they need EMS and being able to be mobile they usually do not cover EMS, she stated that she will call me back  Expected Discharge Plan: Skilled Nursing Facility Barriers to Discharge: Continued Medical Work up  Expected Discharge Plan and Services       Living arrangements for the past 2 months: Single Family Home Expected Discharge Date: 07/10/22                                     Social Determinants of Health (SDOH) Interventions SDOH Screenings   Food Insecurity: No Food Insecurity (07/04/2022)  Housing: Low Risk  (07/04/2022)  Transportation Needs: No Transportation Needs (07/04/2022)  Utilities: Not At Risk (07/04/2022)  Alcohol Screen: Low Risk  (03/21/2022)  Depression (PHQ2-9): High Risk (06/14/2022)  Financial Resource Strain: Low Risk  (04/25/2020)  Recent Concern: Financial Resource Strain - Medium Risk (03/17/2020)  Physical Activity: Inactive (03/21/2021)  Social Connections: Socially Isolated (03/21/2021)  Stress: Stress Concern Present (03/21/2021)  Tobacco Use: Medium Risk (07/04/2022)    Readmission Risk Interventions     No data to display

## 2022-07-10 NOTE — Care Management Important Message (Signed)
Important Message  Patient Details  Name: Nicole Austin MRN: [id number] Date of Birth: February 14, 1961   Medicare Important Message Given:  Yes     Olegario Messier A Bethania Schlotzhauer 07/10/2022, 12:24 PM

## 2022-07-10 NOTE — Discharge Summary (Signed)
Physician Discharge Summary   Patient: Nicole Austin MRN: [id number] DOB: [birth year]  Admit date:     07/04/2022  Discharge date: 07/10/22  Discharge Physician: Alford Highland   PCP: Danelle Berry, PA-C   Recommendations at discharge:   Follow-up team at rehab 1 day Follow-up with your medical doctor after discharge from rehab Follow-up with your orthopedic surgeon once done at rehab  Discharge Diagnoses: Principal Problem:   Traumatic rhabdomyolysis [facility]) Active Problems:   Left knee pain   COPD (chronic obstructive pulmonary disease) (HCC)   Avascular necrosis (HCC)   Bipolar disorder with depression (HCC)   Obesity, Class III, BMI 40-49.9 (morbid obesity) (HCC)   Fall   Iron deficiency anemia   Hyponatremia    Hospital Course: 62 year old female came in after a fall out of bed and was lying on the floor all night long.  She stated that her brother came over and heard her yelling and had to climb through a window to get her.  She was brought into the emergency room and found to be in rhabdomyolysis.  CK was 3039.  5/9.  Continue IV fluids.  CK down to 1873.  Will get PT and OT consultations. 5/10.  CK down to 979.  Will discontinue IV fluids.  PT and OT recommending rehab.  Patient having left knee pain today.  X-ray does not show any fracture. 5/11.  Patient still having left hip and left knee pain.  Transitional care team notified me that the patient does not want to go out to rehab.  Patient's sister will call her to discuss further. 5/12.  Patient having quite a bit of pain in the left hip and knee.  Patient agreeable to rehab.  Assessment and Plan: * Traumatic rhabdomyolysis (HCC) Fall out of bed and was lying on the floor all night long.  CK down to 279.  Hold Lipitor.  Obtain insurance authorization but rehab unable to take until tomorrow.   Left knee pain X-ray does not show any fracture.  Orthopedics recommends outpatient follow-up.  Chronic pain  medications.  COPD (chronic obstructive pulmonary disease) (HCC) Slight wheeze.  Add budesonide and albuterol nebulizers, continue Spiriva.  Avascular necrosis (HCC) Bilateral hips.  On chronic pain medications.  Bipolar disorder with depression (HCC) Continue Latuda and Lexapro   Hyponatremia Sodium a few points less than the normal range.  Iron deficiency anemia Last hemoglobin 9.8.  Fall PT and OT recommending rehab.  Patient agreeable to rehab.  Obesity, Class III, BMI 40-49.9 (morbid obesity) (HCC) BMI 43.46         Consultants: Orthopedic surgery Procedures performed: none Disposition: Rehabilitation facility Diet recommendation:  Regular diet DISCHARGE MEDICATION: Allergies as of 07/10/2022       Reactions   Chantix [varenicline] Nausea Only   Glycopyrrolate Rash   Oral irritation        Medication List     STOP taking these medications    atorvastatin 40 MG tablet Commonly known as: LIPITOR   celecoxib 200 MG capsule Commonly known as: CELEBREX   doxycycline 100 MG tablet Commonly known as: VIBRA-TABS   fluconazole 200 MG tablet Commonly known as: Diflucan   ibuprofen 200 MG tablet Commonly known as: ADVIL   InterDry 10"x144" Shee   miconazole 2 % powder Commonly known as: MICOTIN   mupirocin ointment 2 % Commonly known as: BACTROBAN   nystatin-triamcinolone ointment Commonly known as: MYCOLOG   OXYGEN       TAKE these medications  acetaminophen 500 MG tablet Commonly known as: TYLENOL Take 2 tablets (1,000 mg total) by mouth every 8 (eight) hours as needed for moderate pain, mild pain, fever or headache. What changed: reasons to take this   albuterol 108 (90 Base) MCG/ACT inhaler Commonly known as: VENTOLIN HFA Inhale 2 puffs into the lungs every 4 (four) hours as needed for wheezing or shortness of breath.   ALPRAZolam 0.5 MG tablet Commonly known as: XANAX Take 1 tablet (0.5 mg total) by mouth 2 (two) times  daily.   cyclobenzaprine 5 MG tablet Commonly known as: FLEXERIL Take 1 tablet (5 mg total) by mouth 3 (three) times daily as needed for muscle spasms.   divalproex 250 MG DR tablet Commonly known as: DEPAKOTE Take 1 tablet (250 mg total) by mouth 2 (two) times daily.   escitalopram 20 MG tablet Commonly known as: Lexapro Take 1 tablet (20 mg total) by mouth daily.   Lurasidone HCl 60 MG Tabs Commonly known as: Latuda Take 1 tablet (60 mg total) by mouth daily with breakfast. What changed:  medication strength how much to take   meloxicam 15 MG tablet Commonly known as: MOBIC Take 15 mg by mouth daily.   nystatin powder Commonly known as: MYCOSTATIN/NYSTOP Apply 1 Application topically 3 (three) times daily.   Oxycodone HCl 10 MG Tabs Take 1 tablet (10 mg total) by mouth every 6 (six) hours as needed for severe pain. What changed:  medication strength how much to take   polyethylene glycol 17 g packet Commonly known as: MIRALAX / GLYCOLAX Take 17 g by mouth daily. Start taking on: Jul 11, 2022   rOPINIRole 4 MG 24 hr tablet Commonly known as: REQUIP XL Take 4 mg by mouth at bedtime.   senna-docusate 8.6-50 MG tablet Commonly known as: Senokot-S Take 1 tablet by mouth 2 (two) times daily.   Spiriva Respimat 2.5 MCG/ACT Aers Generic drug: Tiotropium Bromide Monohydrate Inhale 2 puffs into the lungs daily.        Discharge Exam: Filed Weights   07/04/22 1523  Weight: 104.3 kg   Physical Exam HENT:     Head: Normocephalic.     Mouth/Throat:     Pharynx: No oropharyngeal exudate.  Eyes:     General: Lids are normal.     Conjunctiva/sclera: Conjunctivae normal.  Cardiovascular:     Rate and Rhythm: Normal rate and regular rhythm.     Heart sounds: Normal heart sounds, S1 normal and S2 normal.  Pulmonary:     Breath sounds: No decreased breath sounds, wheezing, rhonchi or rales.  Abdominal:     Palpations: Abdomen is soft.     Tenderness: There is  no abdominal tenderness.  Musculoskeletal:     Left knee: Swelling and bony tenderness present. Decreased range of motion.     Right lower leg: Swelling present.     Left lower leg: Swelling present.  Skin:    General: Skin is warm.     Findings: No rash.  Neurological:     Mental Status: She is alert and oriented to person, place, and time.     Comments: Seen while sitting in the chair.  Able to lift bilateral knees up off the chair.  Slight weakness when trying to keep the left leg straight out.      Condition at discharge: stable  The results of significant diagnostics from this hospitalization (including imaging, microbiology, ancillary and laboratory) are listed below for reference.   Imaging Studies: DG Knee  1-2 Views Left  Result Date: 07/06/2022 CLINICAL DATA:  Pt complains of continued knee pain after fall on Tuesday. Pt states that she has issues with her left hip and walks with a walker normally, however, she's had more difficulty walking with the knee and says that any movement is painful at this time EXAM: LEFT KNEE - 1-2 VIEW COMPARISON:  None available FINDINGS: Left total knee prosthesis is well seated without periprosthetic fracture or lucency. Deformity of the proximal fibula most consistent with prior healed fracture. Small knee joint effusion. IMPRESSION: 1. No acute fracture or dislocation of the left knee. 2. Small suprapatellar knee joint effusion. Electronically Signed   By: Acquanetta Belling M.D.   On: 07/06/2022 14:37   CT Thoracic Spine Wo Contrast  Result Date: 07/04/2022 CLINICAL DATA:  Back trauma, no prior imaging (Age >= 16y) EXAM: CT THORACIC AND LUMBAR SPINE WITHOUT CONTRAST TECHNIQUE: Multidetector CT imaging of the thoracic and lumbar spine was performed without contrast. Multiplanar CT image reconstructions were also generated. RADIATION DOSE REDUCTION: This exam was performed according to the departmental dose-optimization program which includes automated  exposure control, adjustment of the mA and/or kV according to patient size and/or use of iterative reconstruction technique. COMPARISON:  CT lumbar spine 06/20/2022, CT chest 05/19/2016 FINDINGS: CT THORACIC SPINE FINDINGS Alignment: Normal. Vertebrae: There is unchanged chronic anterior height loss of T9 and mild anterior wedging of T8. There is a superior endplate Schmorl's node of T9 which is also unchanged. Unchanged mild superior endplate depression of T12. There is no evidence of acute thoracic spine fracture. Paraspinal and other soft tissues: Paraseptal emphysema in the lung apices. Aortic atherosclerosis. Disc levels: There is mild to moderate multilevel degenerative disc disease worst in the midthoracic spine at T7-T8 and T9-T10. Mild-to-moderate multilevel facet arthropathy worst in the upper thoracic spine. No visible impingement. CT LUMBAR SPINE FINDINGS Segmentation: 5 lumbar type vertebrae. Alignment: Slight levoconvex curvature.  No significant listhesis. Vertebrae: No evidence of acute lumbar spine fracture. Unchanged mild degenerative superior endplate irregularity of L1. Paraspinal and other soft tissues: Aortoiliac atherosclerosis. No other significant findings. Disc levels: Mild-to-moderate degenerative disc disease at L3-L4 and L4-L5, with mild disc bulging and bilateral facet arthropathy resulting in mild spinal canal narrowing and neural foraminal narrowing. No visible impingement. Findings are similar to recent CT. IMPRESSION: No evidence of acute fracture in the thoracic or lumbar spine. Mild to moderate multilevel degenerative changes, worst at T7-T8, T9-T10, L3-L4, and L4-L5. Electronically Signed   By: Caprice Renshaw M.D.   On: 07/04/2022 17:19   CT Lumbar Spine Wo Contrast  Result Date: 07/04/2022 CLINICAL DATA:  Back trauma, no prior imaging (Age >= 16y) EXAM: CT THORACIC AND LUMBAR SPINE WITHOUT CONTRAST TECHNIQUE: Multidetector CT imaging of the thoracic and lumbar spine was  performed without contrast. Multiplanar CT image reconstructions were also generated. RADIATION DOSE REDUCTION: This exam was performed according to the departmental dose-optimization program which includes automated exposure control, adjustment of the mA and/or kV according to patient size and/or use of iterative reconstruction technique. COMPARISON:  CT lumbar spine 06/20/2022, CT chest 05/19/2016 FINDINGS: CT THORACIC SPINE FINDINGS Alignment: Normal. Vertebrae: There is unchanged chronic anterior height loss of T9 and mild anterior wedging of T8. There is a superior endplate Schmorl's node of T9 which is also unchanged. Unchanged mild superior endplate depression of T12. There is no evidence of acute thoracic spine fracture. Paraspinal and other soft tissues: Paraseptal emphysema in the lung apices. Aortic atherosclerosis. Disc levels: There  is mild to moderate multilevel degenerative disc disease worst in the midthoracic spine at T7-T8 and T9-T10. Mild-to-moderate multilevel facet arthropathy worst in the upper thoracic spine. No visible impingement. CT LUMBAR SPINE FINDINGS Segmentation: 5 lumbar type vertebrae. Alignment: Slight levoconvex curvature.  No significant listhesis. Vertebrae: No evidence of acute lumbar spine fracture. Unchanged mild degenerative superior endplate irregularity of L1. Paraspinal and other soft tissues: Aortoiliac atherosclerosis. No other significant findings. Disc levels: Mild-to-moderate degenerative disc disease at L3-L4 and L4-L5, with mild disc bulging and bilateral facet arthropathy resulting in mild spinal canal narrowing and neural foraminal narrowing. No visible impingement. Findings are similar to recent CT. IMPRESSION: No evidence of acute fracture in the thoracic or lumbar spine. Mild to moderate multilevel degenerative changes, worst at T7-T8, T9-T10, L3-L4, and L4-L5. Electronically Signed   By: Caprice Renshaw M.D.   On: 07/04/2022 17:19   CT Cervical Spine Wo  Contrast  Result Date: 07/04/2022 CLINICAL DATA:  Neck trauma, intoxicated or obtunded (Age >= 16y) EXAM: CT CERVICAL SPINE WITHOUT CONTRAST TECHNIQUE: Multidetector CT imaging of the cervical spine was performed without intravenous contrast. Multiplanar CT image reconstructions were also generated. RADIATION DOSE REDUCTION: This exam was performed according to the departmental dose-optimization program which includes automated exposure control, adjustment of the mA and/or kV according to patient size and/or use of iterative reconstruction technique. COMPARISON:  CT cervical spine 07/02/2014. FINDINGS: Alignment: None Skull base and vertebrae: There is no evidence of acute cervical spine fracture. Soft tissues and spinal canal: Negative. Disc levels: There is multilevel degenerative disc disease, moderate-severe at C4-C5 and C5-C6 with posterior disc osteophyte complexes, and a suspected left paracentral disc protrusion at C5-C6. progressive multilevel facet arthropathy, worst at C6-C7 and C7-T1 on the right. Upper chest: Paraseptal emphysema in the lung apices. Other: None IMPRESSION: No evidence of acute cervical spine fracture. Multilevel degenerative disc disease, moderate-severe at C4-C5 and C5-C6. Suspected left paracentral disc protrusion at C5-C6. Progressive multilevel facet arthropathy worst at C6-C7 and C7-T1 on the right. Electronically Signed   By: Caprice Renshaw M.D.   On: 07/04/2022 17:09   DG Shoulder Left  Result Date: 07/04/2022 CLINICAL DATA:  Fall EXAM: LEFT SHOULDER - 2+ VIEW COMPARISON:  None Available. FINDINGS: There is no evidence of acute fracture. Alignment is normal. Mild glenohumeral moderate AC joint osteoarthritis. Soft tissues appear unremarkable radiographically. IMPRESSION: No acute fracture or dislocation. Mild glenohumeral and moderate AC joint osteoarthritis. Electronically Signed   By: Caprice Renshaw M.D.   On: 07/04/2022 16:57   DG Pelvis 1-2 Views  Result Date:  07/04/2022 CLINICAL DATA:  Fall EXAM: PELVIS - 1-2 VIEW COMPARISON:  CT 06/20/2022 FINDINGS: Avascular necrosis of the femoral heads with left femoral head collapse and severe left hip osteoarthritis, similar to prior CT. Mild right hip osteoarthritis. No evidence of acute fracture. IMPRESSION: No evidence of acute fracture. 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 Electronically Signed   By: Caprice Renshaw M.D.   On: 07/04/2022 16:55   DG Chest Port 1 View  Result Date: 07/04/2022 CLINICAL DATA:  Fall out of bed with left shoulder and back pain EXAM: PORTABLE CHEST 1 VIEW COMPARISON:  Chest radiograph dated 04/09/2022 FINDINGS: Normal lung volumes. No focal consolidations. No pleural effusion or pneumothorax. The heart size and mediastinal contours are within normal limits. No acute osseous abnormality. IMPRESSION: No radiographic finding of acute displaced fracture. Electronically Signed   By: Benjaman Kindler  Xu M.D.   On: 07/04/2022 16:55   CT HEAD WO CONTRAST  Result Date: 07/04/2022 CLINICAL DATA:  Head trauma, abnormal mental status (Age 53-64y) EXAM: CT HEAD WITHOUT CONTRAST TECHNIQUE: Contiguous axial images were obtained from the base of the skull through the vertex without intravenous contrast. RADIATION DOSE REDUCTION: This exam was performed according to the departmental dose-optimization program which includes automated exposure control, adjustment of the mA and/or kV according to patient size and/or use of iterative reconstruction technique. COMPARISON:  Head CT 03/25/2015 brain MRI 07/17/2018 FINDINGS: Brain: No intracranial hemorrhage, mass effect, or midline shift. Mild generalized atrophy, normal for age. No hydrocephalus. The basilar cisterns are patent. No evidence of territorial infarct or acute ischemia. No extra-axial or intracranial fluid collection. Vascular: No hyperdense vessel or unexpected  calcification. Skull: No fracture or focal lesion. Sinuses/Orbits: Paranasal sinuses and mastoid air cells are clear. The visualized orbits are unremarkable. Bilateral cataract resection. Other: Right supraorbital dermal piercing. IMPRESSION: No acute intracranial abnormality. No skull fracture. Electronically Signed   By: Narda Rutherford M.D.   On: 07/04/2022 16:04   VAS [place] LOWER EXTREMITY VENOUS (DVT) (ONLY [facility] & [facility])  Result Date: 06/22/2022  Lower Venous DVT Study Patient Name:  BEUNA KENNARD  Date of Exam:   06/20/2022 Medical Rec #: [id number]             Accession #:    [id number] Date of Birth: 09-21-1960              Patient Gender: F Patient Age:   21 years Exam Location:  [facility] Procedure:      VAS [place] LOWER EXTREMITY VENOUS (DVT) Referring Phys: BRITNI HENDERLY --------------------------------------------------------------------------------  Indications: Left hip pain.  Limitations: Poor ultrasound/tissue interface. Comparison Study: Previous exam on 10/27/15 was negative for DVT Performing Technologist: Ernestene Mention RVT, RDMS  Examination Guidelines: A complete evaluation includes B-mode imaging, spectral Doppler, color Doppler, and power Doppler as needed of all accessible portions of each vessel. Bilateral testing is considered an integral part of a complete examination. Limited examinations for reoccurring indications may be performed as noted. The reflux portion of the exam is performed with the patient in reverse Trendelenburg.  +-----+---------------+---------+-----------+----------+--------------+ RIGHTCompressibilityPhasicitySpontaneityPropertiesThrombus Aging +-----+---------------+---------+-----------+----------+--------------+ CFV  Full           Yes      Yes                                 +-----+---------------+---------+-----------+----------+--------------+   +---------+---------------+---------+-----------+----------+-------------------+ LEFT      CompressibilityPhasicitySpontaneityPropertiesThrombus Aging      +---------+---------------+---------+-----------+----------+-------------------+ CFV      Full           Yes      Yes                                      +---------+---------------+---------+-----------+----------+-------------------+ SFJ      Full                                                             +---------+---------------+---------+-----------+----------+-------------------+ FV Prox  Full           Yes  Yes                                      +---------+---------------+---------+-----------+----------+-------------------+ FV Mid   Full           Yes      Yes                                      +---------+---------------+---------+-----------+----------+-------------------+ FV DistalFull           Yes      Yes                                      +---------+---------------+---------+-----------+----------+-------------------+ PFV      Full                                                             +---------+---------------+---------+-----------+----------+-------------------+ POP      Full           Yes      Yes                                      +---------+---------------+---------+-----------+----------+-------------------+ PTV      Full                                                             +---------+---------------+---------+-----------+----------+-------------------+ PERO                                                  Not well visualized +---------+---------------+---------+-----------+----------+-------------------+    Summary: RIGHT: - No evidence of common femoral vein obstruction.  LEFT: - There is no evidence of deep vein thrombosis in the lower extremity.  - No cystic structure found in the popliteal fossa.  *See table(s) above for measurements and observations. Electronically signed by Gerarda Fraction on 06/22/2022 at 1:25:52 PM.    Final    CT  Hip Left Wo Contrast  Result Date: 06/20/2022 CLINICAL DATA:  Chronic left hip pain. Stress fracture suspected. Fall. Known avascular necrosis. EXAM: CT OF THE LEFT HIP WITHOUT CONTRAST TECHNIQUE: Multidetector CT imaging of the left hip was performed according to the standard protocol. Multiplanar CT image reconstructions were also generated. RADIATION DOSE REDUCTION: This exam was performed according to the departmental dose-optimization program which includes automated exposure control, adjustment of the mA and/or kV according to patient size and/or use of iterative reconstruction technique. COMPARISON:  CT abdomen and pelvis 03/13/2022 FINDINGS: Bones/Joint/Cartilage Changes of avascular necrosis of both proximal femurs, progressing since the previous study. There is new cortical depression and deformity of the superior left femoral head likely representing acute cortical compression. Degenerative  changes in the hips. No acute fractures demonstrated in the pelvis, sacrum, or right hip. Ligaments Suboptimally assessed by CT. Muscles and Tendons No acute abnormality. Soft tissues Scarring lateral to the right hip is unchanged since prior study, possibly previous postoperative change or old trauma. IMPRESSION: 1. Bilateral avascular necrosis of the hips, progressing since prior study. 2. Acute appearing cortical depression and associated deformity of the superior left femoral head, new since prior study. 3. Degenerative changes in the hips and lumbar spine. Electronically Signed   By: Burman Nieves M.D.   On: 06/20/2022 18:54   CT ABDOMEN PELVIS W CONTRAST  Result Date: 06/20/2022 CLINICAL DATA:  Left pelvic pain and left hip pain after a fall. Low back pain started 1 week ago. EXAM: CT ABDOMEN AND PELVIS WITH CONTRAST TECHNIQUE: Multidetector CT imaging of the abdomen and pelvis was performed using the standard protocol following bolus administration of intravenous contrast. RADIATION DOSE REDUCTION: This  exam was performed according to the departmental dose-optimization program which includes automated exposure control, adjustment of the mA and/or kV according to patient size and/or use of iterative reconstruction technique. CONTRAST:  OMNIPAQUE IOHEXOL 300 MG/ML  SOLN COMPARISON:  03/13/2022 FINDINGS: Lower chest: Lung bases are clear. Hepatobiliary: No focal liver abnormality is seen. No gallstones, gallbladder wall thickening, or biliary dilatation. Pancreas: Unremarkable. No pancreatic ductal dilatation or surrounding inflammatory changes. Spleen: Normal in size without focal abnormality. Adrenals/Urinary Tract: Adrenal glands are unremarkable. Kidneys are normal, without renal calculi, focal lesion, or hydronephrosis. Bladder is unremarkable. Stomach/Bowel: Stomach is within normal limits. Appendix appears normal. No evidence of bowel wall thickening, distention, or inflammatory changes. Vascular/Lymphatic: Aortic atherosclerosis. No enlarged abdominal or pelvic lymph nodes. Reproductive: Uterus and bilateral adnexa are unremarkable. Other: No abdominal wall hernia or abnormality. No abdominopelvic ascites. Musculoskeletal: Degenerative changes in the spine. Acute appearing fracture of the anterior left ninth rib. Old rib fractures. Sclerosis, lucency, and deformities of the femoral heads bilaterally consistent with avascular necrosis, greater on the left. There may be acute depression of the left femoral head, progressing since prior study. IMPRESSION: 1. No acute process demonstrated in the abdomen or pelvis. No evidence of bowel obstruction or inflammation. 2. Aortic atherosclerosis. 3. Changes of bilateral avascular necrosis in the hips, progressing since prior study. Probable acute depression of the left femoral head superiorly. 4. Acute and chronic left rib fractures. Electronically Signed   By: Burman Nieves M.D.   On: 06/20/2022 18:51   CT L-SPINE NO CHARGE  Result Date: 06/20/2022 CLINICAL  DATA:  Chronic hip pain. Low back pain beginning 1 week ago. Hip pain is on the left. EXAM: CT LUMBAR SPINE WITHOUT CONTRAST TECHNIQUE: Multidetector CT imaging of the lumbar spine was performed without intravenous contrast administration. Multiplanar CT image reconstructions were also generated. RADIATION DOSE REDUCTION: This exam was performed according to the departmental dose-optimization program which includes automated exposure control, adjustment of the mA and/or kV according to patient size and/or use of iterative reconstruction technique. COMPARISON:  MRI 03/13/2022 FINDINGS: Segmentation: 5 lumbar type vertebral bodies as numbered previously. Alignment: Mild scoliotic curvature convex to the left. No listhesis. Vertebrae: No fracture or focal bone lesion. Paraspinal and other soft tissues: Negative Disc levels: No significant finding from T11-12 through L2-3. L3-4: Mild bulging of the disc. Mild facet osteoarthritis. No compressive stenosis. L4-5: Bulging of the disc. Bilateral facet degeneration and hypertrophy with gaping joints. Mild stenosis of both lateral recesses. This appearance could worsen with standing or flexion, when anterolisthesis  would likely occur based on the morphology of the facet arthropathy. The facet disease could be a cause of back pain or referred facet syndrome pain in addition. L5-S1: Mild bulging of the disc. Bilateral facet osteoarthritis. No canal or foraminal stenosis. The facet arthritis could be painful. IMPRESSION: 1. L4-5: Bulging of the disc. Bilateral facet degeneration and hypertrophy with gaping joints. Mild stenosis of both lateral recesses. This appearance could worsen with standing or flexion, when anterolisthesis would likely occur based on the morphology of the facet arthropathy. The facet disease could be a cause of back pain or referred facet syndrome pain in addition. 2. L5-S1: Mild bulging of the disc. Bilateral facet osteoarthritis. No canal or foraminal  stenosis. The facet arthritis could be a cause of back pain or referred facet syndrome pain in addition. Electronically Signed   By: [name] Fusi M.D.   On: 06/20/2022 18:40    Microbiology: Results for orders placed or performed in visit on 06/14/22  WOUND CULTURE     Status: None   Collection Time: 06/14/22 12:10 PM   Specimen: Wound  Result Value Ref Range Status   MICRO NUMBER: [id number]  Final   SPECIMEN QUALITY: Adequate  Final   SOURCE: WOUND (SITE NOT SPECIFIED)  Final   STATUS: FINAL  Final   GRAM STAIN:   Final    Few White blood cells seen Many epithelial cells Many Gram positive bacilli Many Gram negative bacilli Many Gram positive cocci in pairs   RESULT:   Final    Growth of skin flora (note: Growth does not include S. aureus, beta-hemolytic Streptococci or P. aeruginosa).   *Note: Due to a large number of results and/or encounters for the requested time period, some results have not been displayed. A complete set of results can be found in Results Review.    Labs: CBC: Recent Labs  Lab 07/04/22 1604 07/06/22 0804 07/08/22 0546 07/10/22 0643  WBC 7.5 3.9* 6.1  --   HGB 11.4* 9.6* 9.8* 10.1*  HCT 35.9* 28.9* 29.6*  --   MCV 96.5 94.4 94.0  --   PLT 252 232 247  --    Basic Metabolic Panel: Recent Labs  Lab 07/04/22 1604 07/05/22 0851 07/08/22 0546 07/10/22 0643  NA 135 136 131* 132*  K 3.6 3.7 3.9 4.5  CL 96* 101 97* 97*  CO2 29 28 28 29   GLUCOSE 81 87 105* 92  BUN 14 15 12 19   CREATININE 1.07* 0.86 0.86 0.92  CALCIUM 9.2 8.4* 8.6* 9.0   Liver Function Tests: Recent Labs  Lab 07/04/22 1604  AST 71*  ALT 22  ALKPHOS 59  BILITOT 0.5  PROT 6.4*  ALBUMIN 3.3*     Discharge time spent: greater than 30 minutes.  Signed: Alford Highland, MD [facility] 07/10/2022

## 2022-07-10 NOTE — TOC Progression Note (Signed)
Transition of Care [facility]) - Progression Note    Patient Details  Name: Nicole Austin MRN: [id number] Date of Birth: Jun 14, 1960  Transition of Care [facility]) CM/SW Contact  Marlowe Sax, RN Phone Number: 07/10/2022, 10:14 AM  Clinical Narrative:   The patient is going to be transported to [facility] and rehab by family and they will be here in 1 hour, she will go to room 907    Expected Discharge Plan: Skilled Nursing Facility Barriers to Discharge: Continued Medical Work up  Expected Discharge Plan and Services       Living arrangements for the past 2 months: Single Family Home Expected Discharge Date: 07/10/22                                     Social Determinants of Health (SDOH) Interventions SDOH Screenings   Food Insecurity: No Food Insecurity (07/04/2022)  Housing: Low Risk  (07/04/2022)  Transportation Needs: No Transportation Needs (07/04/2022)  Utilities: Not At Risk (07/04/2022)  Alcohol Screen: Low Risk  (03/21/2022)  Depression (PHQ2-9): High Risk (06/14/2022)  Financial Resource Strain: Low Risk  (04/25/2020)  Recent Concern: Financial Resource Strain - Medium Risk (03/17/2020)  Physical Activity: Inactive (03/21/2021)  Social Connections: Socially Isolated (03/21/2021)  Stress: Stress Concern Present (03/21/2021)  Tobacco Use: Medium Risk (07/04/2022)    Readmission Risk Interventions     No data to display

## 2022-07-10 NOTE — TOC Progression Note (Signed)
Transition of Care [facility]) - Progression Note    Patient Details  Name: Nicole Austin MRN: [id number] Date of Birth: March 13, 1960  Transition of Care [facility]) CM/SW Contact  Marlowe Sax, RN Phone Number: 07/10/2022, 1:29 PM  Clinical Narrative:   EMS called, there are 11 patient's on the list They added the patient to the list to pick up and transport to [facility] room 907    Expected Discharge Plan: Skilled Nursing Facility Barriers to Discharge: Continued Medical Work up  Expected Discharge Plan and Services       Living arrangements for the past 2 months: Single Family Home Expected Discharge Date: 07/10/22                                     Social Determinants of Health (SDOH) Interventions SDOH Screenings   Food Insecurity: No Food Insecurity (07/04/2022)  Housing: Low Risk  (07/04/2022)  Transportation Needs: No Transportation Needs (07/04/2022)  Utilities: Not At Risk (07/04/2022)  Alcohol Screen: Low Risk  (03/21/2022)  Depression (PHQ2-9): High Risk (06/14/2022)  Financial Resource Strain: Low Risk  (04/25/2020)  Recent Concern: Financial Resource Strain - Medium Risk (03/17/2020)  Physical Activity: Inactive (03/21/2021)  Social Connections: Socially Isolated (03/21/2021)  Stress: Stress Concern Present (03/21/2021)  Tobacco Use: Medium Risk (07/04/2022)    Readmission Risk Interventions     No data to display

## 2022-07-11 ENCOUNTER — Ambulatory Visit: Payer: 59 | Admitting: Family Medicine

## 2022-07-11 DIAGNOSIS — R2689 Other abnormalities of gait and mobility: Secondary | ICD-10-CM | POA: Diagnosis not present

## 2022-07-11 DIAGNOSIS — M25562 Pain in left knee: Secondary | ICD-10-CM | POA: Diagnosis not present

## 2022-07-11 DIAGNOSIS — J441 Chronic obstructive pulmonary disease with (acute) exacerbation: Secondary | ICD-10-CM | POA: Diagnosis not present

## 2022-07-11 DIAGNOSIS — M6282 Rhabdomyolysis: Secondary | ICD-10-CM | POA: Diagnosis not present

## 2022-07-11 DIAGNOSIS — D508 Other iron deficiency anemias: Secondary | ICD-10-CM | POA: Diagnosis not present

## 2022-07-11 DIAGNOSIS — E871 Hypo-osmolality and hyponatremia: Secondary | ICD-10-CM | POA: Diagnosis not present

## 2022-07-11 DIAGNOSIS — R296 Repeated falls: Secondary | ICD-10-CM | POA: Diagnosis not present

## 2022-07-11 DIAGNOSIS — R2681 Unsteadiness on feet: Secondary | ICD-10-CM | POA: Diagnosis not present

## 2022-07-11 DIAGNOSIS — Z9181 History of falling: Secondary | ICD-10-CM | POA: Diagnosis not present

## 2022-07-11 DIAGNOSIS — J449 Chronic obstructive pulmonary disease, unspecified: Secondary | ICD-10-CM | POA: Diagnosis not present

## 2022-07-11 DIAGNOSIS — M6281 Muscle weakness (generalized): Secondary | ICD-10-CM | POA: Diagnosis not present

## 2022-07-11 DIAGNOSIS — M8700 Idiopathic aseptic necrosis of unspecified bone: Secondary | ICD-10-CM | POA: Diagnosis not present

## 2022-07-12 DIAGNOSIS — M6282 Rhabdomyolysis: Secondary | ICD-10-CM | POA: Diagnosis not present

## 2022-07-12 DIAGNOSIS — D508 Other iron deficiency anemias: Secondary | ICD-10-CM | POA: Diagnosis not present

## 2022-07-12 DIAGNOSIS — M8700 Idiopathic aseptic necrosis of unspecified bone: Secondary | ICD-10-CM | POA: Diagnosis not present

## 2022-07-12 DIAGNOSIS — M6281 Muscle weakness (generalized): Secondary | ICD-10-CM | POA: Diagnosis not present

## 2022-07-12 DIAGNOSIS — E871 Hypo-osmolality and hyponatremia: Secondary | ICD-10-CM | POA: Diagnosis not present

## 2022-07-12 DIAGNOSIS — M25562 Pain in left knee: Secondary | ICD-10-CM | POA: Diagnosis not present

## 2022-07-12 DIAGNOSIS — J441 Chronic obstructive pulmonary disease with (acute) exacerbation: Secondary | ICD-10-CM | POA: Diagnosis not present

## 2022-07-12 DIAGNOSIS — R296 Repeated falls: Secondary | ICD-10-CM | POA: Diagnosis not present

## 2022-07-16 ENCOUNTER — Telehealth: Payer: Self-pay | Admitting: Family Medicine

## 2022-07-16 DIAGNOSIS — M25562 Pain in left knee: Secondary | ICD-10-CM | POA: Diagnosis not present

## 2022-07-16 DIAGNOSIS — D508 Other iron deficiency anemias: Secondary | ICD-10-CM | POA: Diagnosis not present

## 2022-07-16 DIAGNOSIS — M8700 Idiopathic aseptic necrosis of unspecified bone: Secondary | ICD-10-CM | POA: Diagnosis not present

## 2022-07-16 DIAGNOSIS — R296 Repeated falls: Secondary | ICD-10-CM | POA: Diagnosis not present

## 2022-07-16 DIAGNOSIS — J441 Chronic obstructive pulmonary disease with (acute) exacerbation: Secondary | ICD-10-CM | POA: Diagnosis not present

## 2022-07-16 DIAGNOSIS — E871 Hypo-osmolality and hyponatremia: Secondary | ICD-10-CM | POA: Diagnosis not present

## 2022-07-16 DIAGNOSIS — M6281 Muscle weakness (generalized): Secondary | ICD-10-CM | POA: Diagnosis not present

## 2022-07-16 DIAGNOSIS — M6282 Rhabdomyolysis: Secondary | ICD-10-CM | POA: Diagnosis not present

## 2022-07-16 NOTE — Telephone Encounter (Signed)
She needs to be seen after discharged

## 2022-07-16 NOTE — Telephone Encounter (Signed)
FYI- I called patient back no answer left vm to call back.

## 2022-07-16 NOTE — Telephone Encounter (Signed)
LVM for patient to call back to let us know when her discharge date is so that we can schedule a TOC appt.       Copied from CRM [phone]. Topic: Appointment Scheduling - Scheduling Inquiry for Clinic >> Jul 16, 2022  9:41 AM [name] wrote: Reason for CRM: The patient would like to speak with a member of clinical staff regarding their upcoming discharge from rehabilitation [facility])   The patient shares that they have been doing PT for roughly a week and would like to discuss their stay   Please contact further when possible

## 2022-07-18 DIAGNOSIS — M6281 Muscle weakness (generalized): Secondary | ICD-10-CM | POA: Diagnosis not present

## 2022-07-18 DIAGNOSIS — R296 Repeated falls: Secondary | ICD-10-CM | POA: Diagnosis not present

## 2022-07-18 DIAGNOSIS — M8700 Idiopathic aseptic necrosis of unspecified bone: Secondary | ICD-10-CM | POA: Diagnosis not present

## 2022-07-18 DIAGNOSIS — M6282 Rhabdomyolysis: Secondary | ICD-10-CM | POA: Diagnosis not present

## 2022-07-18 DIAGNOSIS — E871 Hypo-osmolality and hyponatremia: Secondary | ICD-10-CM | POA: Diagnosis not present

## 2022-07-18 DIAGNOSIS — D508 Other iron deficiency anemias: Secondary | ICD-10-CM | POA: Diagnosis not present

## 2022-07-18 DIAGNOSIS — J441 Chronic obstructive pulmonary disease with (acute) exacerbation: Secondary | ICD-10-CM | POA: Diagnosis not present

## 2022-07-18 DIAGNOSIS — M25562 Pain in left knee: Secondary | ICD-10-CM | POA: Diagnosis not present

## 2022-07-19 DIAGNOSIS — R296 Repeated falls: Secondary | ICD-10-CM | POA: Diagnosis not present

## 2022-07-19 DIAGNOSIS — E871 Hypo-osmolality and hyponatremia: Secondary | ICD-10-CM | POA: Diagnosis not present

## 2022-07-19 DIAGNOSIS — J449 Chronic obstructive pulmonary disease, unspecified: Secondary | ICD-10-CM | POA: Diagnosis not present

## 2022-07-19 DIAGNOSIS — M6282 Rhabdomyolysis: Secondary | ICD-10-CM | POA: Diagnosis not present

## 2022-07-19 DIAGNOSIS — M25562 Pain in left knee: Secondary | ICD-10-CM | POA: Diagnosis not present

## 2022-07-19 DIAGNOSIS — M6281 Muscle weakness (generalized): Secondary | ICD-10-CM | POA: Diagnosis not present

## 2022-07-19 DIAGNOSIS — D508 Other iron deficiency anemias: Secondary | ICD-10-CM | POA: Diagnosis not present

## 2022-07-19 DIAGNOSIS — M8700 Idiopathic aseptic necrosis of unspecified bone: Secondary | ICD-10-CM | POA: Diagnosis not present

## 2022-07-20 ENCOUNTER — Ambulatory Visit: Payer: 59 | Admitting: Family Medicine

## 2022-07-20 DIAGNOSIS — D508 Other iron deficiency anemias: Secondary | ICD-10-CM | POA: Diagnosis not present

## 2022-07-20 DIAGNOSIS — R296 Repeated falls: Secondary | ICD-10-CM | POA: Diagnosis not present

## 2022-07-20 DIAGNOSIS — M6282 Rhabdomyolysis: Secondary | ICD-10-CM | POA: Diagnosis not present

## 2022-07-20 DIAGNOSIS — M6281 Muscle weakness (generalized): Secondary | ICD-10-CM | POA: Diagnosis not present

## 2022-07-20 DIAGNOSIS — E871 Hypo-osmolality and hyponatremia: Secondary | ICD-10-CM | POA: Diagnosis not present

## 2022-07-20 DIAGNOSIS — J441 Chronic obstructive pulmonary disease with (acute) exacerbation: Secondary | ICD-10-CM | POA: Diagnosis not present

## 2022-07-20 DIAGNOSIS — M25562 Pain in left knee: Secondary | ICD-10-CM | POA: Diagnosis not present

## 2022-07-20 DIAGNOSIS — M8700 Idiopathic aseptic necrosis of unspecified bone: Secondary | ICD-10-CM | POA: Diagnosis not present

## 2022-07-24 ENCOUNTER — Ambulatory Visit: Payer: 59 | Admitting: Family Medicine

## 2022-07-24 DIAGNOSIS — R296 Repeated falls: Secondary | ICD-10-CM | POA: Diagnosis not present

## 2022-07-24 DIAGNOSIS — J441 Chronic obstructive pulmonary disease with (acute) exacerbation: Secondary | ICD-10-CM | POA: Diagnosis not present

## 2022-07-24 DIAGNOSIS — M6282 Rhabdomyolysis: Secondary | ICD-10-CM | POA: Diagnosis not present

## 2022-07-24 DIAGNOSIS — M6281 Muscle weakness (generalized): Secondary | ICD-10-CM | POA: Diagnosis not present

## 2022-07-24 DIAGNOSIS — M8700 Idiopathic aseptic necrosis of unspecified bone: Secondary | ICD-10-CM | POA: Diagnosis not present

## 2022-07-24 DIAGNOSIS — E871 Hypo-osmolality and hyponatremia: Secondary | ICD-10-CM | POA: Diagnosis not present

## 2022-07-24 DIAGNOSIS — D508 Other iron deficiency anemias: Secondary | ICD-10-CM | POA: Diagnosis not present

## 2022-07-24 DIAGNOSIS — M25562 Pain in left knee: Secondary | ICD-10-CM | POA: Diagnosis not present

## 2022-07-24 NOTE — Telephone Encounter (Signed)
Pt called in stating that she was still unable to come in the office due to being at [facility].  Pt stated she doesn't know when her discharge date will be and wanted [place] to reach to the facility where she is to see if would have any luck finding out her possible discharge date. Pt stated if we find out anything to please give her a call on her cell.     A medical record request was faxed over to [facility] @ [phone] on 07/24/22 @ 10:20 AM in order to obtain information on the status of patient's discharge date.

## 2022-07-25 DIAGNOSIS — J449 Chronic obstructive pulmonary disease, unspecified: Secondary | ICD-10-CM | POA: Diagnosis not present

## 2022-07-25 DIAGNOSIS — R2689 Other abnormalities of gait and mobility: Secondary | ICD-10-CM | POA: Diagnosis not present

## 2022-07-25 DIAGNOSIS — M6281 Muscle weakness (generalized): Secondary | ICD-10-CM | POA: Diagnosis not present

## 2022-07-25 DIAGNOSIS — R2681 Unsteadiness on feet: Secondary | ICD-10-CM | POA: Diagnosis not present

## 2022-07-25 DIAGNOSIS — M6282 Rhabdomyolysis: Secondary | ICD-10-CM | POA: Diagnosis not present

## 2022-07-25 DIAGNOSIS — Z9181 History of falling: Secondary | ICD-10-CM | POA: Diagnosis not present

## 2022-07-26 DIAGNOSIS — R609 Edema, unspecified: Secondary | ICD-10-CM | POA: Diagnosis not present

## 2022-07-26 DIAGNOSIS — E871 Hypo-osmolality and hyponatremia: Secondary | ICD-10-CM | POA: Diagnosis not present

## 2022-07-27 DIAGNOSIS — J441 Chronic obstructive pulmonary disease with (acute) exacerbation: Secondary | ICD-10-CM | POA: Diagnosis not present

## 2022-07-27 DIAGNOSIS — R296 Repeated falls: Secondary | ICD-10-CM | POA: Diagnosis not present

## 2022-07-27 DIAGNOSIS — E871 Hypo-osmolality and hyponatremia: Secondary | ICD-10-CM | POA: Diagnosis not present

## 2022-07-27 DIAGNOSIS — M6282 Rhabdomyolysis: Secondary | ICD-10-CM | POA: Diagnosis not present

## 2022-07-27 DIAGNOSIS — M8700 Idiopathic aseptic necrosis of unspecified bone: Secondary | ICD-10-CM | POA: Diagnosis not present

## 2022-07-27 DIAGNOSIS — M25562 Pain in left knee: Secondary | ICD-10-CM | POA: Diagnosis not present

## 2022-07-27 DIAGNOSIS — M6281 Muscle weakness (generalized): Secondary | ICD-10-CM | POA: Diagnosis not present

## 2022-07-27 DIAGNOSIS — D508 Other iron deficiency anemias: Secondary | ICD-10-CM | POA: Diagnosis not present

## 2022-07-28 DIAGNOSIS — M87059 Idiopathic aseptic necrosis of unspecified femur: Secondary | ICD-10-CM | POA: Diagnosis not present

## 2022-07-31 ENCOUNTER — Encounter: Payer: Self-pay | Admitting: Family Medicine

## 2022-07-31 ENCOUNTER — Ambulatory Visit (INDEPENDENT_AMBULATORY_CARE_PROVIDER_SITE_OTHER): Payer: 59 | Admitting: Family Medicine

## 2022-07-31 VITALS — BP 114/64 | HR 78 | Temp 97.9°F | Resp 16 | Ht 61.0 in | Wt 229.9 lb

## 2022-07-31 DIAGNOSIS — Z09 Encounter for follow-up examination after completed treatment for conditions other than malignant neoplasm: Secondary | ICD-10-CM | POA: Diagnosis not present

## 2022-07-31 DIAGNOSIS — R251 Tremor, unspecified: Secondary | ICD-10-CM | POA: Diagnosis not present

## 2022-07-31 DIAGNOSIS — R269 Unspecified abnormalities of gait and mobility: Secondary | ICD-10-CM

## 2022-07-31 DIAGNOSIS — Z79899 Other long term (current) drug therapy: Secondary | ICD-10-CM

## 2022-07-31 DIAGNOSIS — Z5181 Encounter for therapeutic drug level monitoring: Secondary | ICD-10-CM

## 2022-07-31 DIAGNOSIS — M87052 Idiopathic aseptic necrosis of left femur: Secondary | ICD-10-CM

## 2022-07-31 DIAGNOSIS — M87051 Idiopathic aseptic necrosis of right femur: Secondary | ICD-10-CM

## 2022-07-31 DIAGNOSIS — R600 Localized edema: Secondary | ICD-10-CM

## 2022-07-31 DIAGNOSIS — Z765 Malingerer [conscious simulation]: Secondary | ICD-10-CM | POA: Diagnosis not present

## 2022-07-31 DIAGNOSIS — F1910 Other psychoactive substance abuse, uncomplicated: Secondary | ICD-10-CM

## 2022-07-31 DIAGNOSIS — G8929 Other chronic pain: Secondary | ICD-10-CM | POA: Diagnosis not present

## 2022-07-31 DIAGNOSIS — Z01818 Encounter for other preprocedural examination: Secondary | ICD-10-CM

## 2022-07-31 DIAGNOSIS — T796XXA Traumatic ischemia of muscle, initial encounter: Secondary | ICD-10-CM

## 2022-07-31 DIAGNOSIS — G4730 Sleep apnea, unspecified: Secondary | ICD-10-CM | POA: Diagnosis not present

## 2022-07-31 DIAGNOSIS — E871 Hypo-osmolality and hyponatremia: Secondary | ICD-10-CM

## 2022-07-31 LAB — CBC WITH DIFFERENTIAL/PLATELET
Lymphs Abs: 2024 cells/uL (ref 850–3900)
Total Lymphocyte: 34.9 %
WBC: 5.8 10*3/uL (ref 3.8–10.8)

## 2022-07-31 NOTE — Progress Notes (Signed)
Name: Nicole Austin   MRN: [id number]    DOB: 02/07/1961   Date:07/31/2022       Progress Note  Chief Complaint  Patient presents with   Pre-op Exam    Pt seeing [facility] in [place], no paperwork regards her upcoming surgery     Subjective:   Nicole Austin is a 62 y.o. female, presents to clinic for HFU  Here for hospital follow up/transition of care.  Admit date: 07/04/2022 Discharge date: 07/10/2022 to SNF /rehab [facility] -then D/C from there on 07/28/2022  Transition of care was not initiated previously, pt just got out of SNF facility -   med changes, diagnosis, specialist follow ups and pts symptoms and condition were all reviewed today with pt in room  Pt was admitted for traumatic rhabdo after fall New medications started per hospitalization include mobic - swapped out for celebrex, she states she is on oxycodone 10 mg - last Rx was last month from hospitalist - I cannot see dispense or administration of meds after that or while in SNF Labs due today are - cbc, cmp, ck urine and UDS - Pt feels better except for LE edema   PDMP: 07/24/2022 ALPRAZOLAM 0.5 MG TABS   Pharmacy: [facility] - [place], [place] - [address] [phone]: 120 tabletDays Supply: 30Sig: SMARTSIG:1 Tablet(s) By Mouth 4 Times Daily PRNSource: DrFirst (Claim History, Ambulatory)Authorized by: SU,HANSENDispense note: [NO ORIGINAL SIG]     07/17/2022 alprazolam 0.5 mg tablet  Pharmacy: [facility], [place] - [address] [phone] Supply: 30Sig: SMARTSIG:1 Tablet(s) By Mouth 4 Times DailySource: DrFirst (Fill History, Ambulatory)Unit strength: 0.5 mgAuthorized by: Irven Baltimore NPDispense note: [PRESCRIBED] ORIGINAL UUV:OZDG 1 tablet by mouth four times a day     07/13/2022 ALPRAZOLAM 0.5 MG TABLET  Pharmacy: [facility], [place] - [address] [phone]: 60 eachRefills Remaining: 0Days Supply: 30Sig: SMARTSIG:By MouthSource:  DrFirst (Fill History, Ambulatory)Authorized by: PERKINS,LINDSEYDispense note: [NO ORIGINAL SIG]     07/11/2022 ALPRAZOLAM 0.5 MG TABLET  Pharmacy: [facility], [place] - [address] [phone]: 10 eachRefills Remaining: 0Days Supply: 5Sig: SMARTSIG:By MouthSource: DrFirst (Fill History, Ambulatory)Authorized by: Burt Ek note: [NO ORIGINAL SIG]     06/26/2022 ALPRAZOLAM 0.5 MG TABS  Pharmacy: [facility] - [place], [place] - [address] [phone]: 120 tabletDays Supply: 30Sig: SMARTSIG:1 Tablet(s) By Mouth 4 Times Daily PRNSource: 2 Outside SourcesAuthorized by: SU,HANSENDispense note: [NO ORIGINAL SIG]     Lab Results  Component Value Date   HGBA1C 4.1 04/14/2022   Anemia Hemoglobin  Date Value Ref Range Status  07/10/2022 10.1 (L) 12.0 - 15.0 g/dL Final    Comment:    Performed at [facility], [address] [place], [place] [address]  07/08/2022 9.8 (L) 12.0 - 15.0 g/dL Final  37/62/8315 9.6 (L) 12.0 - 15.0 g/dL Final  17/61/6073 71.0 (L) 12.0 - 15.0 g/dL Final   HGB  Date Value Ref Range Status  06/15/2014 10.9 (L) 12.0 - 16.0 g/dL Final  62/69/4854 62.7 12.0 - 16.0 g/dL Final  03/50/0938 18.2 12.0 - 16.0 g/dL Final  99/37/1696 78.9 (L) 12.0 - 16.0 g/dL Final      Current Outpatient Medications:    acetaminophen (TYLENOL) 500 MG tablet, Take 2 tablets (1,000 mg total) by mouth every 8 (eight) hours as needed for moderate pain, mild pain, fever or headache., Disp: 90 tablet, Rfl: 0   albuterol (VENTOLIN HFA) 108 (90 Base) MCG/ACT inhaler,  Inhale 2 puffs into the lungs every 4 (four) hours as needed for wheezing or shortness of breath., Disp: 18 g, Rfl: 2   ALPRAZolam (XANAX) 0.5 MG tablet, Take 1 tablet (0.5 mg total) by mouth 2 (two) times daily., Disp: 10 tablet, Rfl: 0   cyclobenzaprine (FLEXERIL) 5 MG tablet, Take 1 tablet (5 mg total) by mouth 3 (three) times daily as needed for muscle spasms., Disp:  20 tablet, Rfl: 0   divalproex (DEPAKOTE) 250 MG DR tablet, Take 1 tablet (250 mg total) by mouth 2 (two) times daily., Disp: 60 tablet, Rfl: 2   Lurasidone HCl (LATUDA) 60 MG TABS, Take 1 tablet (60 mg total) by mouth daily with breakfast., Disp: 30 tablet, Rfl: 0   meloxicam (MOBIC) 15 MG tablet, Take 15 mg by mouth daily., Disp: , Rfl:    nystatin (MYCOSTATIN/NYSTOP) powder, Apply 1 Application topically 3 (three) times daily., Disp: 15 g, Rfl: 0   oxyCODONE 10 MG TABS, Take 1 tablet (10 mg total) by mouth every 6 (six) hours as needed for severe pain., Disp: 20 tablet, Rfl: 0   polyethylene glycol (MIRALAX / GLYCOLAX) 17 g packet, Take 17 g by mouth daily., Disp: 30 each, Rfl: 0   rOPINIRole (REQUIP XL) 4 MG 24 hr tablet, Take 4 mg by mouth at bedtime., Disp: , Rfl:    senna-docusate (SENOKOT-S) 8.6-50 MG tablet, Take 1 tablet by mouth 2 (two) times daily., Disp: 100 tablet, Rfl: 0   Tiotropium Bromide Monohydrate (SPIRIVA RESPIMAT) 2.5 MCG/ACT AERS, Inhale 2 puffs into the lungs daily., Disp: 1 each, Rfl: 0   escitalopram (LEXAPRO) 20 MG tablet, Take 1 tablet (20 mg total) by mouth daily., Disp: 30 tablet, Rfl: 2  Patient Active Problem List   Diagnosis Date Noted   Hyponatremia 07/09/2022   Iron deficiency anemia 07/07/2022   Left knee pain 07/06/2022   Fall 07/04/2022   Traumatic rhabdomyolysis (HCC) 07/04/2022   History of methicillin resistant staphylococcus aureus (MRSA) 05/03/2022   Avascular necrosis of bones of both hips (HCC) 05/03/2022   Chronic prescription benzodiazepine use 05/03/2022   Polypharmacy 04/09/2022   Sinus bradycardia 04/09/2022   Depression with anxiety 04/09/2022   Avascular necrosis (HCC) 03/21/2022   MRSA (methicillin resistant staph aureus) culture positive 05/04/2021   Prolonged QT interval 01/01/2021   Benign neoplasm of ascending colon    Polyp of sigmoid colon    Bipolar disorder with depression (HCC) 02/25/2017   ADD (attention deficit disorder)  02/25/2017   Marijuana use 02/13/2017   Neuropathy, peripheral 05/07/2016   Vitamin B12 deficiency 10/24/2015   Fibromyalgia 08/30/2015   OP (osteoporosis) 06/30/2015   Sleep apnea 06/30/2015   HLD (hyperlipidemia) 05/13/2015   Obesity, Class III, BMI 40-49.9 (morbid obesity) (HCC) 08/16/2014   Low back pain with sciatica 08/04/2014   Polysubstance abuse (HCC) 07/04/2014   Anxiety, generalized 11/24/2013   COPD (chronic obstructive pulmonary disease) (HCC) 11/18/2013   Arthritis of knee, degenerative 07/15/2013   Osteoarthritis of knee, unspecified 07/15/2013    Past Surgical History:  Procedure Laterality Date   BACK SURGERY     lumbar   CATARACT EXTRACTION W/PHACO Right 01/01/2019   Procedure: CATARACT EXTRACTION PHACO AND INTRAOCULAR LENS PLACEMENT (IOC) right vision blue;  Surgeon: Elliot Cousin, MD;  Location: [facility] ORS;  Service: Ophthalmology;  Laterality: Right;  [place] 00:39.4 CDE 5.49 Fluid Pack lot # [id number]   CATARACT EXTRACTION W/PHACO Left 01/29/2019   Procedure: CATARACT EXTRACTION PHACO AND INTRAOCULAR LENS PLACEMENT (IOC) LEFT Vision  Blue;  Surgeon: Elliot Cousin, MD;  Location: [facility];  Service: Ophthalmology;  Laterality: Left;  [place] 00:51.1 CDE 4.38 Fluid Pack Lot # [id number]   COLONOSCOPY WITH PROPOFOL N/A 08/07/2017   Procedure: COLONOSCOPY WITH PROPOFOL;  Surgeon: Pasty Spillers, MD;  Location: [facility];  Service: Endoscopy;  Laterality: N/A;   HAND SURGERY Left    fractured with pins   REPLACEMENT TOTAL KNEE Left 2016   SPINAL CORD STIMULATOR INSERTION  2014   SPINAL CORD STIMULATOR REMOVAL  2014   TUBAL LIGATION      Family History  Problem Relation Age of Onset   Hernia Mother    Heart disease Mother    OCD Mother    Diabetes Mother    Parkinson's disease Father    Bipolar disorder Sister    Schizophrenia Sister    ADD / ADHD Sister    Alcohol abuse Brother    Bipolar disorder Sister    Paranoid behavior Sister    ADD / ADHD Sister     ADD / ADHD Son    Dementia Maternal Grandmother    Emphysema Maternal Grandfather    ADD / ADHD Son    ADD / ADHD Son    Depression Son     Social History   Tobacco Use   Smoking status: Former    Packs/day: 0.50    Years: 36.00    Additional pack years: 0.00    Total pack years: 18.00    Types: Cigarettes    Quit date: 03/13/2022    Years since quitting: 0.3    Passive exposure: Past   Smokeless tobacco: Never   Tobacco comments:    Pt using nicotine patches; 1/2 PPD as of 03/21/21  Vaping Use   Vaping Use: Some days   Substances: Nicotine, Flavoring  Substance Use Topics   Alcohol use: No    Alcohol/week: 0.0 standard drinks of alcohol   Drug use: Yes    Types: Marijuana    Comment: occasionally     Allergies  Allergen Reactions   Chantix [Varenicline] Nausea Only   Glycopyrrolate Rash    Oral irritation    Health Maintenance  Topic Date Due   MAMMOGRAM  Never done   Colonoscopy  08/08/2018   COVID-19 Vaccine (4 - 2023-24 season) 10/27/2021   INFLUENZA VACCINE  09/27/2022   Medicare Annual Wellness (AWV)  03/22/2023   PAP SMEAR-Modifier  05/02/2023   DTaP/Tdap/Td (2 - Td or Tdap) 12/12/2025   Hepatitis C Screening  Completed   HIV Screening  Completed   Zoster Vaccines- Shingrix  Completed   HPV VACCINES  Aged Out    Chart Review Today: I personally reviewed active problem list, medication list, allergies, family history, social history, health maintenance, notes from last encounter, lab results, imaging with the patient/caregiver today.   Review of Systems  Constitutional: Negative.   HENT: Negative.    Eyes: Negative.   Respiratory: Negative.    Cardiovascular: Negative.   Gastrointestinal: Negative.   Endocrine: Negative.   Genitourinary: Negative.   Musculoskeletal: Negative.   Skin: Negative.   Allergic/Immunologic: Negative.   Neurological: Negative.   Hematological: Negative.   Psychiatric/Behavioral: Negative.    All other systems  reviewed and are negative.    Objective:   Vitals:   07/31/22 1431  Pulse: 78  Resp: 16  Temp: 97.9 F (36.6 C)  TempSrc: Oral  SpO2: 92%  Weight: 229 lb 15 oz (104.3 kg)  Height: 5\' 1"  (1.549  m)    Body mass index is 43.45 kg/m.  Physical Exam Vitals and nursing note reviewed.  Constitutional:      General: She is not in acute distress.    Appearance: She is well-developed. She is obese. She is not toxic-appearing or diaphoretic.  HENT:     Head: Normocephalic and atraumatic.     Nose: Nose normal.  Eyes:     General:        Right eye: No discharge.        Left eye: No discharge.     Conjunctiva/sclera: Conjunctivae normal.  Neck:     Trachea: No tracheal deviation.  Cardiovascular:     Rate and Rhythm: Normal rate and regular rhythm.     Pulses: Normal pulses.     Heart sounds: Normal heart sounds. No murmur heard.    No friction rub. No gallop.     Comments: B/L LE pitting edema pedal and pretibial Pulmonary:     Effort: Pulmonary effort is normal. No respiratory distress.     Breath sounds: No stridor. Rhonchi present. No wheezing or rales.  Musculoskeletal:        General: Normal range of motion.  Skin:    General: Skin is warm and dry.     Findings: No rash.  Neurological:     Mental Status: She is alert.     Motor: Tremor present.     Comments: In [facility]  Psychiatric:        Attention and Perception: She is inattentive.        Mood and Affect: Mood and affect normal.        Behavior: Behavior is slowed.        Cognition and Memory: Cognition is impaired. Memory is impaired.     Comments: Slowed speech Aloof Overall pleasant         Assessment & Plan:     ICD-10-CM   1. Encounter for examination following treatment at hospital  Z09 CBC with Differential/Platelet    COMPLETE METABOLIC PANEL WITH GFR    Urinalysis, Routine w reflex microscopic    CK (Creatine Kinase)   note, labs,discharge all reviewed, obtaining rehab records, meds and labs  unclear form 5/14 to 6/1    2. Encounter for medication monitoring  Z51.81 CBC with Differential/Platelet    COMPLETE METABOLIC PANEL WITH GFR    Drugs of abuse screen w/o alc, rtn urine-sln    Urinalysis, Routine w reflex microscopic    3. Avascular necrosis of bones of both hips (HCC)  M87.051    M87.052    surgery now with [facility] in [place] - request record of last OV    4. Unspecified abnormalities of gait and mobility  R26.9     5. Encounter for preoperative assessment  Z01.818 CBC with Differential/Platelet    COMPLETE METABOLIC PANEL WITH GFR    Drugs of abuse screen w/o alc, rtn urine-sln    Urinalysis, Routine w reflex microscopic    6. Sleep apnea, unspecified type  G47.30     7. Long-term use of high-risk medication  Z79.899 Drugs of abuse screen w/o alc, rtn urine-sln    Urinalysis, Routine w reflex microscopic    8. Hyponatremia  E87.1 COMPLETE METABOLIC PANEL WITH GFR   last sodium 132, in SNF she was on sodium supplement, then had  LE edema and then got lasix    9. Polypharmacy  Z79.899 CBC with Differential/Platelet    COMPLETE METABOLIC PANEL WITH GFR  Drugs of abuse screen w/o alc, rtn urine-sln    Urinalysis, Routine w reflex microscopic    10. Chronic prescription benzodiazepine use  Z79.899 Drugs of abuse screen w/o alc, rtn urine-sln   in hospital and rehab she reports xanax BID, encouraged her to stay only on BID and avoid returning to QID dosing    11. Avascular necrosis of right femur (HCC)  M87.051    pending surgery    12. Polysubstance abuse (HCC)  F19.10 Drugs of abuse screen w/o alc, rtn urine-sln   not on chronic pain meds, discharged from last surgeon due to narcotic abuse, hx of abuse    13. Traumatic rhabdomyolysis, initial encounter (HCC)  T79.6XXA COMPLETE METABOLIC PANEL WITH GFR    Urinalysis, Routine w reflex microscopic    CK (Creatine Kinase)   recheck renal function - CK normalized inpt prior to discharge    14. Leg edema   R60.0    bilateral - hx of low sodium, will need to check labs to determine if she can take lasix, compression socks, elevation, ambulation as able recommended    15. Tremor  R25.1    bilateral arms and hands shaking, which pt does not always have    16. Drug-seeking behavior  Z76.5    13 ED visits this year, narcotic refilled often by ED providers despite not being on pain management or even under care of ortho surgeon and on benzos    17. Other chronic pain  G89.29    chronic back and leg pain which worsened over the last 6-8 months, dx avascular necrosis of femoral head, since Jan, referred to pain mgmt - not gone          Nonnicotine vape, flavors Breathing is good Psych Su on depakote, xanax, lexapro and latuda - she reports 3 month f/up visits with him doing video visits for some time due to pain/ambulation etc - will need to send letter to Dr. Janeece Riggers with concerns - since we are not in the same systems and he is not seeing her in person.  Concern for narcotic abuse/seeking/misuse with polypharmacy Benzo + narcotic meds daily, and hx of polysubstance abuse specifically previously with narcotics  I have reviewed the patient's medications and controlled substance database today and reviewed it with her as well I have specifically explained that for the past several months, even this entire year she has not been on chronic pain management and she should not be on narcotics at all unless prescribed by her surgeon and incoordination with planning her surgery and postop I additionally explained that there are many state laws and DEA policies which require prescribers to check the controlled substance database and she should not be allowed to or should not be getting refills at various ERs or from multiple providers for chronic pain.  If she requires chronic pain management 1 provider should be in charge of it in conjunction with oversight, office visits, drug testing and a controlled substance  contract.  I further explained that with her medical history of polysubstance abuse and prior narcotic abuse this should be avoided additionally it should be avoided because she is on high-dose daily benzodiazepines which significantly increases her risk for unintentional overdose I have explicitly explained that I will not prescribe any controlled substances to her because of her extremely high risk of unintentional overdose explained to her that she could stop breathing in her sleep and never wake up, and she was strongly urged to continue to stay  on a lower dose Xanax and cannot continue taking any pain medicines if she is able to slowly wean off and work with her Careers adviser.  Further stipulated and warned her that if she continues to seek pain meds from various providers and is not transparent and honest with her surgeon or with [place] here that she will be at risk for being discharged from her primary care (me) or other surgeons because it would be unsafe to continue to care for her she continues to get narcotics and give untrue unclear or purposely falls history to any medical providers. With her home meds she is already at risk of polypharmacy and sedation and has had an ER/admission for just her meds per psych and muscle relaxers and pain meds from ER and prior Ortho.  Again no ortho since [facility] in Jan-Feb has Rx'd her pain meds  Today patient reports that she has been seeing [facility] in [place] and Dr. Charlann Boxer for several months now, she reports she is not sure when surgery is going to be scheduled.  I reviewed with her that she is only recently discharged from [facility] clinic in April which is less than 2 months ago.  I cannot see any records with [facility] we will request records and I anticipate that she will be scheduled for preop clearance at the hospital (like prior surgery 1/24 preop clearance, 1/31 scheduled and cancelled)     No follow-ups on file.   Danelle Berry, PA-C 07/31/22  2:35 PM

## 2022-08-01 ENCOUNTER — Telehealth: Payer: Self-pay | Admitting: Family Medicine

## 2022-08-01 DIAGNOSIS — J449 Chronic obstructive pulmonary disease, unspecified: Secondary | ICD-10-CM | POA: Diagnosis not present

## 2022-08-01 DIAGNOSIS — M6282 Rhabdomyolysis: Secondary | ICD-10-CM | POA: Diagnosis not present

## 2022-08-01 DIAGNOSIS — M810 Age-related osteoporosis without current pathological fracture: Secondary | ICD-10-CM | POA: Diagnosis not present

## 2022-08-01 LAB — CBC WITH DIFFERENTIAL/PLATELET
Basophils Absolute: 29 cells/uL (ref 0–200)
Basophils Relative: 0.5 %
Eosinophils Absolute: 360 cells/uL (ref 15–500)
MCH: 30.5 pg (ref 27.0–33.0)
MPV: 10 fL (ref 7.5–12.5)
Neutro Abs: 2958 cells/uL (ref 1500–7800)
RBC: 3.67 10*6/uL — ABNORMAL LOW (ref 3.80–5.10)
RDW: 13 % (ref 11.0–15.0)

## 2022-08-01 LAB — COMPLETE METABOLIC PANEL WITH GFR
AG Ratio: 1.4 (calc) (ref 1.0–2.5)
CO2: 31 mmol/L (ref 20–32)
Potassium: 4.2 mmol/L (ref 3.5–5.3)
Sodium: 129 mmol/L — ABNORMAL LOW (ref 135–146)
Total Bilirubin: 0.4 mg/dL (ref 0.2–1.2)
Total Protein: 6.9 g/dL (ref 6.1–8.1)
eGFR: 60 mL/min/{1.73_m2} (ref >=60)

## 2022-08-01 LAB — URINALYSIS, ROUTINE W REFLEX MICROSCOPIC
Hgb urine dipstick: NEGATIVE
Ketones, ur: NEGATIVE
Nitrite: POSITIVE — AB

## 2022-08-01 LAB — CK: Total CK: 184 U/L — ABNORMAL HIGH (ref 29–143)

## 2022-08-01 NOTE — Telephone Encounter (Signed)
Copied from CRM [id number]. Topic: General - Other >> Aug 01, 2022 12:58 PM Dondra Prader E wrote: Reason for CRM: Pt has questions regarding the rehab she just completed, wants to know if PCP needs the folder she received from there   Best contact: [phone]

## 2022-08-01 NOTE — Telephone Encounter (Signed)
Called pt back and she is questioning what we need for her hip surgery clearance. I advised like I told her yesterday during her appointment we need the surgical clearance paperwork from [facility]. Pt verbalized understanding     Copied from CRM [id number]. Topic: General - Other >> Aug 01, 2022  3:38 PM Clide Dales wrote: Patient is requesting a callback from provider to discuss her hip replacement surgery. Please advise.

## 2022-08-02 DIAGNOSIS — E785 Hyperlipidemia, unspecified: Secondary | ICD-10-CM | POA: Diagnosis not present

## 2022-08-02 DIAGNOSIS — I252 Old myocardial infarction: Secondary | ICD-10-CM | POA: Diagnosis not present

## 2022-08-02 DIAGNOSIS — Z87891 Personal history of nicotine dependence: Secondary | ICD-10-CM | POA: Diagnosis not present

## 2022-08-02 DIAGNOSIS — Z791 Long term (current) use of non-steroidal anti-inflammatories (NSAID): Secondary | ICD-10-CM | POA: Diagnosis not present

## 2022-08-02 DIAGNOSIS — Z9981 Dependence on supplemental oxygen: Secondary | ICD-10-CM | POA: Diagnosis not present

## 2022-08-02 DIAGNOSIS — B372 Candidiasis of skin and nail: Secondary | ICD-10-CM | POA: Diagnosis not present

## 2022-08-02 DIAGNOSIS — I69331 Monoplegia of upper limb following cerebral infarction affecting right dominant side: Secondary | ICD-10-CM | POA: Diagnosis not present

## 2022-08-02 DIAGNOSIS — K219 Gastro-esophageal reflux disease without esophagitis: Secondary | ICD-10-CM | POA: Diagnosis not present

## 2022-08-02 DIAGNOSIS — Z556 Problems related to health literacy: Secondary | ICD-10-CM | POA: Diagnosis not present

## 2022-08-02 DIAGNOSIS — I2510 Atherosclerotic heart disease of native coronary artery without angina pectoris: Secondary | ICD-10-CM | POA: Diagnosis not present

## 2022-08-02 DIAGNOSIS — G20A1 Parkinson's disease without dyskinesia, without mention of fluctuations: Secondary | ICD-10-CM | POA: Diagnosis not present

## 2022-08-02 DIAGNOSIS — G8929 Other chronic pain: Secondary | ICD-10-CM | POA: Diagnosis not present

## 2022-08-02 DIAGNOSIS — G4730 Sleep apnea, unspecified: Secondary | ICD-10-CM | POA: Diagnosis not present

## 2022-08-02 DIAGNOSIS — R7303 Prediabetes: Secondary | ICD-10-CM | POA: Diagnosis not present

## 2022-08-02 DIAGNOSIS — M1612 Unilateral primary osteoarthritis, left hip: Secondary | ICD-10-CM | POA: Diagnosis not present

## 2022-08-02 DIAGNOSIS — M810 Age-related osteoporosis without current pathological fracture: Secondary | ICD-10-CM | POA: Diagnosis not present

## 2022-08-02 DIAGNOSIS — M797 Fibromyalgia: Secondary | ICD-10-CM | POA: Diagnosis not present

## 2022-08-02 DIAGNOSIS — G2581 Restless legs syndrome: Secondary | ICD-10-CM | POA: Diagnosis not present

## 2022-08-02 DIAGNOSIS — J4489 Other specified chronic obstructive pulmonary disease: Secondary | ICD-10-CM | POA: Diagnosis not present

## 2022-08-02 DIAGNOSIS — M109 Gout, unspecified: Secondary | ICD-10-CM | POA: Diagnosis not present

## 2022-08-02 LAB — CBC WITH DIFFERENTIAL/PLATELET
Absolute Monocytes: 429 cells/uL (ref 200–950)
Eosinophils Relative: 6.2 %
HCT: 34.5 % — ABNORMAL LOW (ref 35.0–45.0)
Hemoglobin: 11.2 g/dL — ABNORMAL LOW (ref 11.7–15.5)
MCHC: 32.5 g/dL (ref 32.0–36.0)
MCV: 94 fL (ref 80.0–100.0)
Monocytes Relative: 7.4 %
Neutrophils Relative %: 51 %
Platelets: 260 10*3/uL (ref 140–400)

## 2022-08-02 LAB — COMPLETE METABOLIC PANEL WITH GFR
ALT: 8 U/L (ref 6–29)
AST: 14 U/L (ref 10–35)
Albumin: 4 g/dL (ref 3.6–5.1)
Alkaline phosphatase (APISO): 78 U/L (ref 37–153)
BUN: 16 mg/dL (ref 7–25)
Calcium: 9.6 mg/dL (ref 8.6–10.4)
Chloride: 88 mmol/L — ABNORMAL LOW (ref 98–110)
Creat: 1.05 mg/dL (ref 0.50–1.05)
Globulin: 2.9 g/dL (calc) (ref 1.9–3.7)
Glucose, Bld: 78 mg/dL (ref 65–99)

## 2022-08-02 LAB — DRUG MONITOR, PANEL 1, W/CONF, URINE
Alphahydroxyalprazolam: 486 ng/mL — ABNORMAL HIGH (ref <25)
Alphahydroxymidazolam: NEGATIVE ng/mL (ref <50)
Alphahydroxytriazolam: NEGATIVE ng/mL (ref <50)
Aminoclonazepam: NEGATIVE ng/mL (ref <25)
Amphetamines: NEGATIVE ng/mL (ref <500)
Barbiturates: NEGATIVE ng/mL (ref <300)
Benzodiazepines: POSITIVE ng/mL — AB (ref <100)
Cocaine Metabolite: NEGATIVE ng/mL (ref <150)
Codeine: NEGATIVE ng/mL (ref <50)
Creatinine: 66.7 mg/dL (ref >=20.0)
Hydrocodone: NEGATIVE ng/mL (ref <50)
Hydromorphone: NEGATIVE ng/mL (ref <50)
Hydroxyethylflurazepam: NEGATIVE ng/mL (ref <50)
Lorazepam: NEGATIVE ng/mL (ref <50)
Marijuana Metabolite: 47 ng/mL — ABNORMAL HIGH (ref <5)
Marijuana Metabolite: POSITIVE ng/mL — AB (ref <20)
Methadone Metabolite: NEGATIVE ng/mL (ref <100)
Morphine: NEGATIVE ng/mL (ref <50)
Nordiazepam: NEGATIVE ng/mL (ref <50)
Norhydrocodone: NEGATIVE ng/mL (ref <50)
Noroxycodone: 2041 ng/mL — ABNORMAL HIGH (ref <50)
Opiates: NEGATIVE ng/mL (ref <100)
Oxazepam: NEGATIVE ng/mL (ref <50)
Oxidant: NEGATIVE ug/mL (ref <200)
Oxycodone: 1855 ng/mL — ABNORMAL HIGH (ref <50)
Oxycodone: POSITIVE ng/mL — AB (ref <100)
Oxymorphone: 1757 ng/mL — ABNORMAL HIGH (ref <50)
Phencyclidine: NEGATIVE ng/mL (ref <25)
Temazepam: NEGATIVE ng/mL (ref <50)
pH: 6 (ref 4.5–9.0)

## 2022-08-02 LAB — URINALYSIS, ROUTINE W REFLEX MICROSCOPIC
Bilirubin Urine: NEGATIVE
Glucose, UA: NEGATIVE
Hyaline Cast: NONE SEEN /LPF
Protein, ur: NEGATIVE
RBC / HPF: NONE SEEN /HPF (ref 0–2)
Specific Gravity, Urine: 1.011 (ref 1.001–1.035)
pH: 6 (ref 5.0–8.0)

## 2022-08-02 LAB — MICROSCOPIC MESSAGE

## 2022-08-02 LAB — DM TEMPLATE

## 2022-08-03 ENCOUNTER — Ambulatory Visit: Payer: Self-pay

## 2022-08-03 ENCOUNTER — Telehealth: Payer: Self-pay | Admitting: Family Medicine

## 2022-08-03 ENCOUNTER — Telehealth: Payer: 59 | Admitting: Family Medicine

## 2022-08-03 ENCOUNTER — Ambulatory Visit: Payer: Self-pay | Admitting: *Deleted

## 2022-08-03 DIAGNOSIS — E871 Hypo-osmolality and hyponatremia: Secondary | ICD-10-CM

## 2022-08-03 DIAGNOSIS — R600 Localized edema: Secondary | ICD-10-CM

## 2022-08-03 DIAGNOSIS — Z5181 Encounter for therapeutic drug level monitoring: Secondary | ICD-10-CM

## 2022-08-03 DIAGNOSIS — M7989 Other specified soft tissue disorders: Secondary | ICD-10-CM

## 2022-08-03 NOTE — Telephone Encounter (Signed)
Pt called states she did do video visit but did not give her lasix states she would have to get through PCP.  Sheliah Mends worked 1/2 and all other providers gone for the day.  Told pt if she felt bad enough to go to ER.  Otherwise it would be Monday for provider response

## 2022-08-03 NOTE — Telephone Encounter (Signed)
Message from Arther Dames sent at 08/03/2022 10:58 AM EDT  Summary: leg swelling   Patient states that the swelling in her legs is getting work and it is hard to walk with the pressure. Please advise.          Call History   Type Contact Phone/Fax User  08/03/2022 10:57 AM EDT Phone (Incoming) Basnett, Shatisha Falter (Self) [phone] (H) Mabe, Hennie Duos

## 2022-08-03 NOTE — Telephone Encounter (Signed)
Patient is calling to discuss refill for lasix. Reporting leg swelling - Spoke to NT- See NT TE. Scheduled VV with UC- due to transportation. Advised to speak to her PCP. Patient is calling back during lunch. Office is closed. Pt had OV with [name] on 07/31/22. Please advise

## 2022-08-03 NOTE — Progress Notes (Signed)
[place]   Needs in person eval- advised to follow up with PC she was just seen in office, and if needed UC   Patient acknowledged agreement and understanding of the plan.

## 2022-08-03 NOTE — Telephone Encounter (Signed)
Attempted to return pt's call.   Left a voicemail to call back to discuss symptoms with a nurse.

## 2022-08-03 NOTE — Telephone Encounter (Signed)
    Chief Complaint: Feet and legs swelling to knees. Seen in OV 07/31/22. Asking for Lasix. Symptoms: Swelling Frequency: 1 week ago Pertinent Negatives: Patient denies chest pain or SOB Disposition: [] ED /[] Urgent Care (no appt availability in office) / [] Appointment(In office/virtual)/ []  [facility]/ [] Home Care/ [] Refused Recommended Disposition /[] [facility]/ [x]  Follow-up with PCP Additional Notes: Please advise pt.  Answer Assessment - Initial Assessment Questions 1. ONSET: "When did the swelling start?" (e.g., minutes, hours, days)     1 week ago 2. LOCATION: "What part of the leg is swollen?"  "Are both legs swollen or just one leg?"     Both legs 3. SEVERITY: "How bad is the swelling?" (e.g., localized; mild, moderate, severe)   - Localized: Small area of swelling localized to one leg.   - MILD pedal edema: Swelling limited to foot and ankle, pitting edema < 1/4 inch (6 mm) deep, rest and elevation eliminate most or all swelling.   - MODERATE edema: Swelling of lower leg to knee, pitting edema > 1/4 inch (6 mm) deep, rest and elevation only partially reduce swelling.   - SEVERE edema: Swelling extends above knee, facial or hand swelling present.      To knees 4. REDNESS: "Does the swelling look red or infected?"     No 5. PAIN: "Is the swelling painful to touch?" If Yes, ask: "How painful is it?"   (Scale 1-10; mild, moderate or severe)     Moderate 6. FEVER: "Do you have a fever?" If Yes, ask: "What is it, how was it measured, and when did it start?"      No 7. CAUSE: "What do you think is causing the leg swelling?"     Unsure 8. MEDICAL HISTORY: "Do you have a history of blood clots (e.g., DVT), cancer, heart failure, kidney disease, or liver failure?"     No 9. RECURRENT SYMPTOM: "Have you had leg swelling before?" If Yes, ask: "When was the last time?" "What happened that time?"     No 10. OTHER SYMPTOMS: "Do you have any other symptoms?" (e.g.,  chest pain, difficulty breathing)       No 11. PREGNANCY: "Is there any chance you are pregnant?" "When was your last menstrual period?"       No  Protocols used: Leg Swelling and Edema-A-AH

## 2022-08-03 NOTE — Telephone Encounter (Signed)
  Chief Complaint: bilateral leg swelling Symptoms: painful swelling in feet and legs - above the knee Frequency: 1 week Pertinent Negatives: Patient denies chest pain, difficutly with breathing Disposition: [] ED /[] Urgent Care (no appt availability in office) / [] Appointment(In office/virtual)/ [x]  [place] [facility]/ [] Home Care/ [] Refused Recommended Disposition /[] [place] [facility]/ []  Follow-up with PCP Additional Notes: No appointment available in office/float- [facility] VV scheduled due to patient transportation difficulty - patient advised she needs to follow up in office   Reason for Disposition  SEVERE leg swelling (e.g., swelling extends above knee, entire leg is swollen, weeping fluid)  Answer Assessment - Initial Assessment Questions 1. ONSET: "When did the swelling start?" (e.g., minutes, hours, days)     Started 1 week ago 2. LOCATION: "What part of the leg is swollen?"  "Are both legs swollen or just one leg?"     Bilateral leg swelling- past the knee 3. SEVERITY: "How bad is the swelling?" (e.g., localized; mild, moderate, severe)   - Localized: Small area of swelling localized to one leg.   - MILD pedal edema: Swelling limited to foot and ankle, pitting edema < 1/4 inch (6 mm) deep, rest and elevation eliminate most or all swelling.   - MODERATE edema: Swelling of lower leg to knee, pitting edema > 1/4 inch (6 mm) deep, rest and elevation only partially reduce swelling.   - SEVERE edema: Swelling extends above knee, facial or hand swelling present.      severe 4. REDNESS: "Does the swelling look red or infected?"     no 5. PAIN: "Is the swelling painful to touch?" If Yes, ask: "How painful is it?"   (Scale 1-10; mild, moderate or severe)     severe 6. FEVER: "Do you have a fever?" If Yes, ask: "What is it, how was it measured, and when did it start?"      chills 7. CAUSE: "What do you think is causing the leg swelling?"     Unable to get swelling down- lasix helped  in rehab 8. MEDICAL HISTORY: "Do you have a history of blood clots (e.g., DVT), cancer, heart failure, kidney disease, or liver failure?"     no 9. RECURRENT SYMPTOM: "Have you had leg swelling before?" If Yes, ask: "When was the last time?" "What happened that time?"     Yes- lasix helped 10. OTHER SYMPTOMS: "Do you have any other symptoms?" (e.g., chest pain, difficulty breathing)       no  Protocols used: Leg Swelling and Edema-A-AH

## 2022-08-03 NOTE — Telephone Encounter (Signed)
2nd attempt to return her call.   Same voicemail left as 1st time.

## 2022-08-05 DIAGNOSIS — J441 Chronic obstructive pulmonary disease with (acute) exacerbation: Secondary | ICD-10-CM | POA: Diagnosis not present

## 2022-08-05 DIAGNOSIS — J449 Chronic obstructive pulmonary disease, unspecified: Secondary | ICD-10-CM | POA: Diagnosis not present

## 2022-08-06 ENCOUNTER — Other Ambulatory Visit: Payer: Self-pay

## 2022-08-06 ENCOUNTER — Emergency Department
Admission: EM | Admit: 2022-08-06 | Discharge: 2022-08-06 | Disposition: A | Discharge status: Home / Self Care | Payer: 59 | Attending: Emergency Medicine | Admitting: Emergency Medicine

## 2022-08-06 ENCOUNTER — Encounter: Payer: Self-pay | Admitting: Family Medicine

## 2022-08-06 ENCOUNTER — Emergency Department: Payer: 59

## 2022-08-06 DIAGNOSIS — J449 Chronic obstructive pulmonary disease, unspecified: Secondary | ICD-10-CM | POA: Diagnosis not present

## 2022-08-06 DIAGNOSIS — R600 Localized edema: Secondary | ICD-10-CM | POA: Insufficient documentation

## 2022-08-06 DIAGNOSIS — N3289 Other specified disorders of bladder: Secondary | ICD-10-CM | POA: Diagnosis not present

## 2022-08-06 DIAGNOSIS — N12 Tubulo-interstitial nephritis, not specified as acute or chronic: Secondary | ICD-10-CM

## 2022-08-06 DIAGNOSIS — M7989 Other specified soft tissue disorders: Secondary | ICD-10-CM | POA: Diagnosis present

## 2022-08-06 LAB — COMPREHENSIVE METABOLIC PANEL
ALT: 12 U/L (ref 0–44)
AST: 25 U/L (ref 15–41)
Albumin: 3.4 g/dL — ABNORMAL LOW (ref 3.5–5.0)
Alkaline Phosphatase: 70 U/L (ref 38–126)
Anion gap: 11 (ref 5–15)
BUN: 9 mg/dL (ref 8–23)
CO2: 30 mmol/L (ref 22–32)
Calcium: 8.6 mg/dL — ABNORMAL LOW (ref 8.9–10.3)
Chloride: 87 mmol/L — ABNORMAL LOW (ref 98–111)
Creatinine, Ser: 1.3 mg/dL — ABNORMAL HIGH (ref 0.44–1.00)
GFR, Estimated: 46 mL/min — ABNORMAL LOW (ref >60)
Glucose, Bld: 87 mg/dL (ref 70–99)
Potassium: 3.4 mmol/L — ABNORMAL LOW (ref 3.5–5.1)
Sodium: 128 mmol/L — ABNORMAL LOW (ref 135–145)
Total Bilirubin: 0.2 mg/dL — ABNORMAL LOW (ref 0.3–1.2)
Total Protein: 6.5 g/dL (ref 6.5–8.1)

## 2022-08-06 LAB — BRAIN NATRIURETIC PEPTIDE: B Natriuretic Peptide: 81.8 pg/mL (ref 0.0–100.0)

## 2022-08-06 LAB — URINALYSIS, ROUTINE W REFLEX MICROSCOPIC
Bilirubin Urine: NEGATIVE
Glucose, UA: NEGATIVE mg/dL
Hgb urine dipstick: NEGATIVE
Ketones, ur: NEGATIVE mg/dL
Nitrite: POSITIVE — AB
Protein, ur: NEGATIVE mg/dL
Specific Gravity, Urine: 1.014 (ref 1.005–1.030)
pH: 7 (ref 5.0–8.0)

## 2022-08-06 LAB — CBC
HCT: 33 % — ABNORMAL LOW (ref 36.0–46.0)
Hemoglobin: 11.1 g/dL — ABNORMAL LOW (ref 12.0–15.0)
MCH: 31.3 pg (ref 26.0–34.0)
MCHC: 33.6 g/dL (ref 30.0–36.0)
MCV: 93 fL (ref 80.0–100.0)
Platelets: 233 10*3/uL (ref 150–400)
RBC: 3.55 MIL/uL — ABNORMAL LOW (ref 3.87–5.11)
RDW: 12 % (ref 11.5–15.5)
WBC: 6.7 10*3/uL (ref 4.0–10.5)
nRBC: 0 % (ref 0.0–0.2)

## 2022-08-06 MED ORDER — FUROSEMIDE 20 MG PO TABS
10.0000 mg | ORAL_TABLET | Freq: Every day | ORAL | 0 refills | Status: DC | PRN
Start: 2022-08-06 — End: 2023-07-03

## 2022-08-06 MED ORDER — POTASSIUM CHLORIDE CRYS ER 20 MEQ PO TBCR
20.0000 meq | EXTENDED_RELEASE_TABLET | Freq: Every day | ORAL | 0 refills | Status: DC | PRN
Start: 2022-08-06 — End: 2022-10-10

## 2022-08-06 MED ORDER — OXYCODONE-ACETAMINOPHEN 5-325 MG PO TABS
1.0000 | ORAL_TABLET | Freq: Once | ORAL | Status: AC
  Administered 2022-08-06: 1 via ORAL
  Filled 2022-08-06: qty 1

## 2022-08-06 MED ORDER — CEFDINIR 300 MG PO CAPS
300.0000 mg | ORAL_CAPSULE | Freq: Once | ORAL | Status: AC
  Administered 2022-08-06: 300 mg via ORAL
  Filled 2022-08-06: qty 1

## 2022-08-06 MED ORDER — CEPHALEXIN 500 MG PO CAPS: 1000.0000 mg | ORAL_CAPSULE | Freq: Two times a day (BID) | ORAL | 0 refills | Status: AC

## 2022-08-06 NOTE — Discharge Instructions (Signed)
You should stop taking the Lasix as this is likely causing your kidney function and sodium levels to get worse.  You should use compression stockings and keep your legs elevated whenever possible.  Please take all antibiotics until done for your kidney infection and return to the ER for any worsening symptoms.  Please schedule follow-up with your primary care doctor for recheck of blood work including your kidney function and sodium levels.

## 2022-08-06 NOTE — ED Provider Triage Note (Signed)
Emergency Medicine Provider Triage Evaluation Note  Deberah Adolf , a 62 y.o. female  was evaluated in triage.  Pt complains of bilateral lower extremity swelling. No relief with Lasix. Denies chest pain or shortness of breath..   Physical Exam  BP 123/60   Pulse 80   Temp 98.4 F (36.9 C) (Oral)   Resp 16   Ht 5\' 1"  (1.549 m)   Wt 104 kg   LMP  (LMP Unknown)   SpO2 98%   BMI 43.32 kg/m  Gen:   Awake, no distress   Resp:  Normal effort  MSK:   Moves extremities without difficulty  Other:    Medical Decision Making  Medically screening exam initiated at 5:50 PM.  Appropriate orders placed.  Samarra Ridgely Dorris was informed that the remainder of the evaluation will be completed by another provider, this initial triage assessment does not replace that evaluation, and the importance of remaining in the ED until their evaluation is complete.    Chinita Pester, FNP 08/06/22 1751

## 2022-08-06 NOTE — Addendum Note (Signed)
Addended by: Danelle Berry on: 08/06/2022 10:28 AM   Modules accepted: Orders

## 2022-08-06 NOTE — ED Triage Notes (Signed)
Pt to ed from [facility] via P[facility] for bilateral lower extremity edema x 2 weeks. Pt was seen 6/7 virtually for same and was prescribed lasix. Pt is caox4, in no acute distress and in wheel chair in triage.

## 2022-08-06 NOTE — ED Provider Notes (Signed)
[facility] Provider Note    Event Date/Time   First MD Initiated Contact with Patient 08/06/22 1926     (approximate)   History   Chief Complaint Leg Swelling (Bilateral x 1 week)   HPI  Nicole Austin is a 62 y.o. female with past medical history of COPD, bipolar disorder, hyponatremia, and iron deficiency anemia who presents to the ED complaining of leg swelling.  Patient reports that she has been dealing with persistent swelling in both of her legs for about the past week.  She denies any associated pain and has not had any redness or drainage from the legs.  She denies any difficulty breathing and has not dealt with any fever or cough.  She states her PCP prescribed Lasix for this problem but she has not had any relief.  She also reports some pain in her left flank radiating around to the left lower quadrant of her abdomen over the past 24 hours.  She denies any associated dysuria, hematuria, nausea, vomiting, or diarrhea.     Physical Exam   Triage Vital Signs: ED Triage Vitals  Enc Vitals Group     BP 08/06/22 1746 123/60     Pulse Rate 08/06/22 1746 80     Resp 08/06/22 1746 16     Temp 08/06/22 1746 98.4 F (36.9 C)     Temp Source 08/06/22 1746 Oral     SpO2 08/06/22 1746 98 %     Weight 08/06/22 1747 229 lb 4.5 oz (104 kg)     Height 08/06/22 1747 5\' 1"  (1.549 m)     Head Circumference --      Peak Flow --      Pain Score 08/06/22 1747 0     Pain Loc --      Pain Edu? --      Excl. in GC? --     Most recent vital signs: Vitals:   08/06/22 1746  BP: 123/60  Pulse: 80  Resp: 16  Temp: 98.4 F (36.9 C)  SpO2: 98%    Constitutional: Alert and oriented. Eyes: Conjunctivae are normal. Head: Atraumatic. Nose: No congestion/rhinnorhea. Mouth/Throat: Mucous membranes are moist.  Cardiovascular: Normal rate, regular rhythm. Grossly normal heart sounds.  2+ radial and DP pulses bilaterally. Respiratory: Normal respiratory  effort.  No retractions. Lungs CTAB. Gastrointestinal: Soft and nontender.  No CVA tenderness bilaterally.  No distention. Musculoskeletal: No lower extremity tenderness, 1+ pitting edema to knees bilaterally without associated erythema or warmth. Neurologic:  Normal speech and language. No gross focal neurologic deficits are appreciated.    ED Results / Procedures / Treatments   Labs (all labs ordered are listed, but only abnormal results are displayed) Labs Reviewed  CBC - Abnormal; Notable for the following components:      Result Value   RBC 3.55 (*)    Hemoglobin 11.1 (*)    HCT 33.0 (*)    All other components within normal limits  COMPREHENSIVE METABOLIC PANEL - Abnormal; Notable for the following components:   Sodium 128 (*)    Potassium 3.4 (*)    Chloride 87 (*)    Creatinine, Ser 1.30 (*)    Calcium 8.6 (*)    Albumin 3.4 (*)    Total Bilirubin 0.2 (*)    GFR, Estimated 46 (*)    All other components within normal limits  URINALYSIS, ROUTINE W REFLEX MICROSCOPIC - Abnormal; Notable for the following components:   Color, Urine YELLOW (*)  APPearance HAZY (*)    Nitrite POSITIVE (*)    Leukocytes,Ua SMALL (*)    Bacteria, UA RARE (*)    All other components within normal limits  URINE CULTURE  BRAIN NATRIURETIC PEPTIDE   RADIOLOGY CT renal protocol reviewed and interpreted by me with no obstructing ureteral stone.  PROCEDURES:  Critical Care performed: No  Procedures   MEDICATIONS ORDERED IN ED: Medications  oxyCODONE-acetaminophen (PERCOCET/ROXICET) 5-325 MG per tablet 1 tablet (has no administration in time range)  cefdinir (OMNICEF) capsule 300 mg (has no administration in time range)     IMPRESSION / MDM / ASSESSMENT AND PLAN / ED COURSE  I reviewed the triage vital signs and the nursing notes.                              62 y.o. female with past medical history of COPD, bipolar disorder, hyponatremia, and iron deficiency anemia who presents  to the ED complaining of leg swelling for the past week associated with left flank pain.  Patient's presentation is most consistent with acute presentation with potential threat to life or bodily function.  Differential diagnosis includes, but is not limited to, CHF, DVT, cellulitis, venous insufficiency, kidney stone, pyelonephritis.  Patient well-appearing and in no acute distress, vital signs are unremarkable.  She is neurovascular intact to her bilateral lower extremities, with no signs of infection and I doubt DVT given unilateral swelling.  She has strong DP pulses bilaterally, no symptoms to suggest CHF.  She does have a mild AKI compared to previous and some acute on chronic hyponatremia.  LFTs are unremarkable, no significant anemia or leukocytosis noted.  Suspect leg swelling is due to venous insufficiency and patient was counseled on compression stockings.  We will further assess her flank pain with urinalysis and CT renal protocol, patient declines pain medication currently.  CT renal protocol negative for kidney stone but does show inflammatory changes concerning for pyelonephritis.  Urinalysis appears consistent with infection and was sent for culture.  With reassuring vital signs and otherwise reassuring workup, patient appropriate for outpatient management.  We will give initial dose of antibiotics here in the ED and she was counseled to follow-up with her PCP for recheck of blood work.  She was counseled to return to the ED for new or worsening symptoms, patient agrees with plan.      FINAL CLINICAL IMPRESSION(S) / ED DIAGNOSES   Final diagnoses:  Pyelonephritis  Peripheral edema     Rx / DC Orders   ED Discharge Orders          Ordered    cephALEXin (KEFLEX) 500 MG capsule  2 times daily        08/06/22 2130             Note:  This document was prepared using Dragon voice recognition software and may include unintentional dictation errors.   Chesley Noon,  MD 08/06/22 2132

## 2022-08-07 ENCOUNTER — Other Ambulatory Visit: Payer: Self-pay | Admitting: Family Medicine

## 2022-08-07 DIAGNOSIS — Z791 Long term (current) use of non-steroidal anti-inflammatories (NSAID): Secondary | ICD-10-CM | POA: Diagnosis not present

## 2022-08-07 DIAGNOSIS — M810 Age-related osteoporosis without current pathological fracture: Secondary | ICD-10-CM | POA: Diagnosis not present

## 2022-08-07 DIAGNOSIS — M109 Gout, unspecified: Secondary | ICD-10-CM | POA: Diagnosis not present

## 2022-08-07 DIAGNOSIS — I2510 Atherosclerotic heart disease of native coronary artery without angina pectoris: Secondary | ICD-10-CM | POA: Diagnosis not present

## 2022-08-07 DIAGNOSIS — M1612 Unilateral primary osteoarthritis, left hip: Secondary | ICD-10-CM | POA: Diagnosis not present

## 2022-08-07 DIAGNOSIS — Z87891 Personal history of nicotine dependence: Secondary | ICD-10-CM | POA: Diagnosis not present

## 2022-08-07 DIAGNOSIS — I252 Old myocardial infarction: Secondary | ICD-10-CM | POA: Diagnosis not present

## 2022-08-07 DIAGNOSIS — J441 Chronic obstructive pulmonary disease with (acute) exacerbation: Secondary | ICD-10-CM | POA: Diagnosis not present

## 2022-08-07 DIAGNOSIS — G8929 Other chronic pain: Secondary | ICD-10-CM | POA: Diagnosis not present

## 2022-08-07 DIAGNOSIS — Z9981 Dependence on supplemental oxygen: Secondary | ICD-10-CM | POA: Diagnosis not present

## 2022-08-07 DIAGNOSIS — B372 Candidiasis of skin and nail: Secondary | ICD-10-CM | POA: Diagnosis not present

## 2022-08-07 DIAGNOSIS — J4489 Other specified chronic obstructive pulmonary disease: Secondary | ICD-10-CM | POA: Diagnosis not present

## 2022-08-07 DIAGNOSIS — Z556 Problems related to health literacy: Secondary | ICD-10-CM | POA: Diagnosis not present

## 2022-08-07 DIAGNOSIS — I69331 Monoplegia of upper limb following cerebral infarction affecting right dominant side: Secondary | ICD-10-CM | POA: Diagnosis not present

## 2022-08-07 DIAGNOSIS — G4730 Sleep apnea, unspecified: Secondary | ICD-10-CM | POA: Diagnosis not present

## 2022-08-07 DIAGNOSIS — G20A1 Parkinson's disease without dyskinesia, without mention of fluctuations: Secondary | ICD-10-CM | POA: Diagnosis not present

## 2022-08-07 DIAGNOSIS — G2581 Restless legs syndrome: Secondary | ICD-10-CM | POA: Diagnosis not present

## 2022-08-07 DIAGNOSIS — J449 Chronic obstructive pulmonary disease, unspecified: Secondary | ICD-10-CM | POA: Diagnosis not present

## 2022-08-07 DIAGNOSIS — R600 Localized edema: Secondary | ICD-10-CM

## 2022-08-07 DIAGNOSIS — E785 Hyperlipidemia, unspecified: Secondary | ICD-10-CM | POA: Diagnosis not present

## 2022-08-07 DIAGNOSIS — R7303 Prediabetes: Secondary | ICD-10-CM | POA: Diagnosis not present

## 2022-08-07 DIAGNOSIS — K219 Gastro-esophageal reflux disease without esophagitis: Secondary | ICD-10-CM | POA: Diagnosis not present

## 2022-08-07 DIAGNOSIS — M797 Fibromyalgia: Secondary | ICD-10-CM | POA: Diagnosis not present

## 2022-08-07 NOTE — Telephone Encounter (Unsigned)
Copied from CRM [id number]. Topic: Quick Communication - Home Health Verbal Orders >> Aug 07, 2022 12:01 PM Clide Dales wrote: Caller/Agency: [facility] Callback Number: [phone] Requesting OT/PT/Skilled Nursing/Social Work/Speech Therapy: PT Frequency: 1W1, 0A5

## 2022-08-09 DIAGNOSIS — M109 Gout, unspecified: Secondary | ICD-10-CM | POA: Diagnosis not present

## 2022-08-09 DIAGNOSIS — Z791 Long term (current) use of non-steroidal anti-inflammatories (NSAID): Secondary | ICD-10-CM | POA: Diagnosis not present

## 2022-08-09 DIAGNOSIS — I2510 Atherosclerotic heart disease of native coronary artery without angina pectoris: Secondary | ICD-10-CM | POA: Diagnosis not present

## 2022-08-09 DIAGNOSIS — R7303 Prediabetes: Secondary | ICD-10-CM | POA: Diagnosis not present

## 2022-08-09 DIAGNOSIS — I252 Old myocardial infarction: Secondary | ICD-10-CM | POA: Diagnosis not present

## 2022-08-09 DIAGNOSIS — M797 Fibromyalgia: Secondary | ICD-10-CM | POA: Diagnosis not present

## 2022-08-09 DIAGNOSIS — Z9981 Dependence on supplemental oxygen: Secondary | ICD-10-CM | POA: Diagnosis not present

## 2022-08-09 DIAGNOSIS — G20A1 Parkinson's disease without dyskinesia, without mention of fluctuations: Secondary | ICD-10-CM | POA: Diagnosis not present

## 2022-08-09 DIAGNOSIS — G4730 Sleep apnea, unspecified: Secondary | ICD-10-CM | POA: Diagnosis not present

## 2022-08-09 DIAGNOSIS — E785 Hyperlipidemia, unspecified: Secondary | ICD-10-CM | POA: Diagnosis not present

## 2022-08-09 DIAGNOSIS — B372 Candidiasis of skin and nail: Secondary | ICD-10-CM | POA: Diagnosis not present

## 2022-08-09 DIAGNOSIS — G8929 Other chronic pain: Secondary | ICD-10-CM | POA: Diagnosis not present

## 2022-08-09 DIAGNOSIS — M810 Age-related osteoporosis without current pathological fracture: Secondary | ICD-10-CM | POA: Diagnosis not present

## 2022-08-09 DIAGNOSIS — Z87891 Personal history of nicotine dependence: Secondary | ICD-10-CM | POA: Diagnosis not present

## 2022-08-09 DIAGNOSIS — M1612 Unilateral primary osteoarthritis, left hip: Secondary | ICD-10-CM | POA: Diagnosis not present

## 2022-08-09 DIAGNOSIS — I69331 Monoplegia of upper limb following cerebral infarction affecting right dominant side: Secondary | ICD-10-CM | POA: Diagnosis not present

## 2022-08-09 DIAGNOSIS — J4489 Other specified chronic obstructive pulmonary disease: Secondary | ICD-10-CM | POA: Diagnosis not present

## 2022-08-09 DIAGNOSIS — Z556 Problems related to health literacy: Secondary | ICD-10-CM | POA: Diagnosis not present

## 2022-08-09 DIAGNOSIS — K219 Gastro-esophageal reflux disease without esophagitis: Secondary | ICD-10-CM | POA: Diagnosis not present

## 2022-08-09 DIAGNOSIS — G2581 Restless legs syndrome: Secondary | ICD-10-CM | POA: Diagnosis not present

## 2022-08-09 LAB — URINE CULTURE: Culture: >=100000 — AB

## 2022-08-10 ENCOUNTER — Telehealth: Payer: Self-pay | Admitting: *Deleted

## 2022-08-10 DIAGNOSIS — G8929 Other chronic pain: Secondary | ICD-10-CM | POA: Diagnosis not present

## 2022-08-10 DIAGNOSIS — G4730 Sleep apnea, unspecified: Secondary | ICD-10-CM | POA: Diagnosis not present

## 2022-08-10 DIAGNOSIS — I2510 Atherosclerotic heart disease of native coronary artery without angina pectoris: Secondary | ICD-10-CM | POA: Diagnosis not present

## 2022-08-10 DIAGNOSIS — M810 Age-related osteoporosis without current pathological fracture: Secondary | ICD-10-CM | POA: Diagnosis not present

## 2022-08-10 DIAGNOSIS — G2581 Restless legs syndrome: Secondary | ICD-10-CM | POA: Diagnosis not present

## 2022-08-10 DIAGNOSIS — M797 Fibromyalgia: Secondary | ICD-10-CM | POA: Diagnosis not present

## 2022-08-10 DIAGNOSIS — I252 Old myocardial infarction: Secondary | ICD-10-CM | POA: Diagnosis not present

## 2022-08-10 DIAGNOSIS — Z791 Long term (current) use of non-steroidal anti-inflammatories (NSAID): Secondary | ICD-10-CM | POA: Diagnosis not present

## 2022-08-10 DIAGNOSIS — R7303 Prediabetes: Secondary | ICD-10-CM | POA: Diagnosis not present

## 2022-08-10 DIAGNOSIS — Z87891 Personal history of nicotine dependence: Secondary | ICD-10-CM | POA: Diagnosis not present

## 2022-08-10 DIAGNOSIS — Z9981 Dependence on supplemental oxygen: Secondary | ICD-10-CM | POA: Diagnosis not present

## 2022-08-10 DIAGNOSIS — I69331 Monoplegia of upper limb following cerebral infarction affecting right dominant side: Secondary | ICD-10-CM | POA: Diagnosis not present

## 2022-08-10 DIAGNOSIS — J4489 Other specified chronic obstructive pulmonary disease: Secondary | ICD-10-CM | POA: Diagnosis not present

## 2022-08-10 DIAGNOSIS — Z556 Problems related to health literacy: Secondary | ICD-10-CM | POA: Diagnosis not present

## 2022-08-10 DIAGNOSIS — G20A1 Parkinson's disease without dyskinesia, without mention of fluctuations: Secondary | ICD-10-CM | POA: Diagnosis not present

## 2022-08-10 DIAGNOSIS — K219 Gastro-esophageal reflux disease without esophagitis: Secondary | ICD-10-CM | POA: Diagnosis not present

## 2022-08-10 DIAGNOSIS — E785 Hyperlipidemia, unspecified: Secondary | ICD-10-CM | POA: Diagnosis not present

## 2022-08-10 DIAGNOSIS — B372 Candidiasis of skin and nail: Secondary | ICD-10-CM | POA: Diagnosis not present

## 2022-08-10 DIAGNOSIS — M109 Gout, unspecified: Secondary | ICD-10-CM | POA: Diagnosis not present

## 2022-08-10 DIAGNOSIS — M1612 Unilateral primary osteoarthritis, left hip: Secondary | ICD-10-CM | POA: Diagnosis not present

## 2022-08-10 NOTE — Telephone Encounter (Signed)
Transition Care Management Follow-up Telephone Call Date of discharge and from where: [facility] 08/06/2022 How have you been since you were released from the hospital? Medicine is not working still in pain recommended she reach to PCP to followup she said no not that PCP  at least  Any questions or concerns? No  Items Reviewed: Did the pt receive and understand the discharge instructions provided? Yes  Medications obtained and verified? No  Other? No  Any new allergies since your discharge? No  Dietary orders reviewed? No Do you have support at home? No   HFollow up appointments reviewed:  PCP Hospital f/u appt confirmed? No  Medicine is not working offered to help follow up with Dr but she will find a new PCP does not think this one is working out . has transportation to [facility]rs or urgent care to see about the antibioitc as she thinks its not working   Are transportation arrangements needed? No  If their condition worsens, is the pt aware to call PCP or go to the Emergency Dept.? Yes Was the patient provided with contact information for the PCP's office or ED? Yes Was to pt encouraged to call back with questions or concerns? Yes   Alois Cliche -Berneda [facility] [place], Population [facility] [phone] [address] , [place] [address] Email : [email or web address]

## 2022-08-10 NOTE — Telephone Encounter (Signed)
Cindy calling from [facility] is calling to follow up on orders. Please advise [phone]

## 2022-08-13 ENCOUNTER — Telehealth: Payer: Self-pay | Admitting: Family Medicine

## 2022-08-13 ENCOUNTER — Telehealth: Payer: Self-pay

## 2022-08-13 DIAGNOSIS — M1612 Unilateral primary osteoarthritis, left hip: Secondary | ICD-10-CM | POA: Diagnosis not present

## 2022-08-13 DIAGNOSIS — J4489 Other specified chronic obstructive pulmonary disease: Secondary | ICD-10-CM | POA: Diagnosis not present

## 2022-08-13 DIAGNOSIS — M797 Fibromyalgia: Secondary | ICD-10-CM | POA: Diagnosis not present

## 2022-08-13 DIAGNOSIS — G4730 Sleep apnea, unspecified: Secondary | ICD-10-CM | POA: Diagnosis not present

## 2022-08-13 DIAGNOSIS — I2510 Atherosclerotic heart disease of native coronary artery without angina pectoris: Secondary | ICD-10-CM | POA: Diagnosis not present

## 2022-08-13 DIAGNOSIS — G20A1 Parkinson's disease without dyskinesia, without mention of fluctuations: Secondary | ICD-10-CM | POA: Diagnosis not present

## 2022-08-13 DIAGNOSIS — E785 Hyperlipidemia, unspecified: Secondary | ICD-10-CM | POA: Diagnosis not present

## 2022-08-13 DIAGNOSIS — M810 Age-related osteoporosis without current pathological fracture: Secondary | ICD-10-CM | POA: Diagnosis not present

## 2022-08-13 DIAGNOSIS — R7303 Prediabetes: Secondary | ICD-10-CM | POA: Diagnosis not present

## 2022-08-13 DIAGNOSIS — I69331 Monoplegia of upper limb following cerebral infarction affecting right dominant side: Secondary | ICD-10-CM | POA: Diagnosis not present

## 2022-08-13 DIAGNOSIS — Z556 Problems related to health literacy: Secondary | ICD-10-CM | POA: Diagnosis not present

## 2022-08-13 DIAGNOSIS — Z87891 Personal history of nicotine dependence: Secondary | ICD-10-CM | POA: Diagnosis not present

## 2022-08-13 DIAGNOSIS — G8929 Other chronic pain: Secondary | ICD-10-CM | POA: Diagnosis not present

## 2022-08-13 DIAGNOSIS — Z791 Long term (current) use of non-steroidal anti-inflammatories (NSAID): Secondary | ICD-10-CM | POA: Diagnosis not present

## 2022-08-13 DIAGNOSIS — G2581 Restless legs syndrome: Secondary | ICD-10-CM | POA: Diagnosis not present

## 2022-08-13 DIAGNOSIS — K219 Gastro-esophageal reflux disease without esophagitis: Secondary | ICD-10-CM | POA: Diagnosis not present

## 2022-08-13 DIAGNOSIS — M109 Gout, unspecified: Secondary | ICD-10-CM | POA: Diagnosis not present

## 2022-08-13 DIAGNOSIS — I252 Old myocardial infarction: Secondary | ICD-10-CM | POA: Diagnosis not present

## 2022-08-13 DIAGNOSIS — Z9981 Dependence on supplemental oxygen: Secondary | ICD-10-CM | POA: Diagnosis not present

## 2022-08-13 DIAGNOSIS — B372 Candidiasis of skin and nail: Secondary | ICD-10-CM | POA: Diagnosis not present

## 2022-08-13 NOTE — Telephone Encounter (Addendum)
Medication Refill - Medication: tizanidine (ZANAFLEX) 4 MG capsule   Stated has 4 capsules left.   Has the patient contacted their pharmacy? No. No, more refills.  (Agent: If no, request that the patient contact the pharmacy for the refill. If patient does not wish to contact the pharmacy document the reason why and proceed with request.)   Preferred Pharmacy (with phone number or street name):  [facility] - [place], [place] - [address]  [address] [place] [address]  Phone: [phone] Fax: [phone]  Hours: Not open 24 hours   Has the patient been seen for an appointment in the last year OR does the patient have an upcoming appointment? Yes.    Agent: Please be advised that RX refills may take up to 3 business days. We ask that you follow-up with your pharmacy.

## 2022-08-13 NOTE — Telephone Encounter (Signed)
Cindy from [facility] called to follow up on verbal order for Today. PT- last consult, already scheduled today this morning.  I gave verbal order to her due to patient already scheduled and it would be her last one per [facility] since pt just recent out of the hospital and needed to be re evaluated.  Arline Asp states she has called multiple times previously to check on verbal order. I do not see any telephone encounters related to this. FYI

## 2022-08-14 ENCOUNTER — Ambulatory Visit: Payer: 59 | Admitting: Family Medicine

## 2022-08-14 DIAGNOSIS — M87059 Idiopathic aseptic necrosis of unspecified femur: Secondary | ICD-10-CM | POA: Diagnosis not present

## 2022-08-14 NOTE — Telephone Encounter (Signed)
Pt was not aware med was d/c but verbalized understanding

## 2022-08-14 NOTE — Telephone Encounter (Unsigned)
Copied from CRM [id number]. Topic: General - Other >> Aug 14, 2022 10:48 AM Macon Large wrote: Reason for CRM: Amil Amen with [facility] reports the patient now has case management services through her insurance. Amil Amen stated for any care coordination needs please contact her at [phone] Ext. [phone]

## 2022-08-14 NOTE — Telephone Encounter (Signed)
FYI

## 2022-08-15 ENCOUNTER — Ambulatory Visit: Payer: Self-pay | Admitting: *Deleted

## 2022-08-15 DIAGNOSIS — M87852 Other osteonecrosis, left femur: Secondary | ICD-10-CM | POA: Diagnosis not present

## 2022-08-15 NOTE — Telephone Encounter (Signed)
Second attempt to reach pt, left VM to call back. 

## 2022-08-15 NOTE — Telephone Encounter (Signed)
Summary: Swelling   Pt says her feet are swollen, red, and sore. Pt says it is tightness in her feet as well.  ----- Message from Cheri Guppy sent at 08/15/2022  3:07 PM EDT ----- Pt says her feet are swollen, red, and sore. Pt says it is tightness in her feet as well.         Attempted to call patient- no answer- left message to call back on VM.

## 2022-08-15 NOTE — Telephone Encounter (Signed)
Third attempt to reach pt, routing to practice for PCPs resolution per protocol. 

## 2022-08-16 NOTE — Telephone Encounter (Signed)
Per Sheliah Mends she will need to be seen in person. Please schedule

## 2022-08-16 NOTE — Telephone Encounter (Signed)
Lvm for pt to call back. 

## 2022-08-17 ENCOUNTER — Other Ambulatory Visit: Payer: Self-pay | Admitting: Internal Medicine

## 2022-08-17 ENCOUNTER — Ambulatory Visit: Payer: Self-pay | Admitting: *Deleted

## 2022-08-17 ENCOUNTER — Telehealth: Payer: Self-pay | Admitting: Family Medicine

## 2022-08-17 ENCOUNTER — Other Ambulatory Visit: Payer: Self-pay | Admitting: Family Medicine

## 2022-08-17 DIAGNOSIS — R600 Localized edema: Secondary | ICD-10-CM

## 2022-08-17 NOTE — Telephone Encounter (Signed)
Cyndi calling from [facility] is calling to request verbal Orders OT & PT  Frequency 1 w 4, 1 every other for 4 weeks. Verbal ok on VM [phone]

## 2022-08-17 NOTE — Telephone Encounter (Signed)
Summary: Pt having swelling in her legs   Clinical Pharmacist Lauren with [facility] requests that someone contact patient regarding swelling in her legs. Lauren stated that she just spoke with the patient and she was crying because of the swelling in her legs. Cb# [phone]         Attempted to contact patient regarding symptoms-no answer- left message to call office

## 2022-08-17 NOTE — Telephone Encounter (Signed)
LVM for patient for patient to call to schedule an appt.

## 2022-08-17 NOTE — Telephone Encounter (Signed)
Left vm stating ok for VO

## 2022-08-17 NOTE — Telephone Encounter (Signed)
Summary: Pt having swelling in her legs   Clinical Pharmacist Lauren with [facility] requests that someone contact patient regarding swelling in her legs. Lauren stated that she just spoke with the patient and she was crying because of the swelling in her legs. Cb# [phone]     2nd attempt to contact patient on #[phone] to review sx of leg swelling per request of pharmacist Lauren from [facility]. No answer, LVMTCB [phone].

## 2022-08-17 NOTE — Telephone Encounter (Signed)
3rd attempt to contact patient regarding leg swelling. Called [phone] no answer, LVMTCB [phone].     Reason for Disposition  Third attempt to contact caller AND no contact made. Phone number verified.  Answer Assessment - Initial Assessment Questions N/A Pharmacist from [facility] called to report patient c /o leg swelling .  Protocols used: No Contact or Duplicate Contact Call-A-AH

## 2022-08-19 ENCOUNTER — Encounter: Payer: Self-pay | Admitting: Emergency Medicine

## 2022-08-19 ENCOUNTER — Emergency Department
Admission: EM | Admit: 2022-08-19 | Discharge: 2022-08-19 | Disposition: A | Discharge status: Home / Self Care | Payer: 59 | Attending: Emergency Medicine | Admitting: Emergency Medicine

## 2022-08-19 ENCOUNTER — Other Ambulatory Visit: Payer: Self-pay

## 2022-08-19 DIAGNOSIS — J449 Chronic obstructive pulmonary disease, unspecified: Secondary | ICD-10-CM | POA: Insufficient documentation

## 2022-08-19 DIAGNOSIS — Z760 Encounter for issue of repeat prescription: Secondary | ICD-10-CM | POA: Insufficient documentation

## 2022-08-19 DIAGNOSIS — W19XXXA Unspecified fall, initial encounter: Secondary | ICD-10-CM | POA: Diagnosis not present

## 2022-08-19 DIAGNOSIS — M25552 Pain in left hip: Secondary | ICD-10-CM | POA: Diagnosis not present

## 2022-08-19 MED ORDER — OXYCODONE HCL 5 MG PO TABS
10.0000 mg | ORAL_TABLET | Freq: Once | ORAL | Status: AC
  Administered 2022-08-19: 10 mg via ORAL
  Filled 2022-08-19: qty 2

## 2022-08-19 MED ORDER — OXYCODONE HCL 10 MG PO TABS: 10.0000 mg | ORAL_TABLET | Freq: Three times a day (TID) | ORAL | 0 refills | Status: AC | PRN

## 2022-08-19 NOTE — ED Notes (Signed)
See triage note  Presents with cont'd pain to left hip  States not a new injury  Unable to bear wt or walk d/t pain  Is currently out of pain meds

## 2022-08-19 NOTE — ED Triage Notes (Signed)
First Nurse Note:  Pt via [facility] from home. Pt c/o L hip pain, 10/10 radiates down L leg. Pt has had pain since Dec when she injuries. Pt ran out of her pain medication 2 days ago. Pt is A&Ox4 and NAD 144/70 BP  66 HR  98% on RA  18 RR 82 CBG

## 2022-08-19 NOTE — Discharge Instructions (Signed)
Call Pain management to make an appointment for further management.

## 2022-08-19 NOTE — ED Provider Notes (Signed)
[facility] Emergency Department Provider Note     Event Date/Time   First MD Initiated Contact with Patient 08/19/22 1344     (approximate)   History   Hip Pain (left) and Medication Refill   HPI  Nicole Austin is Austin 62 y.o. female with Austin history of osteoporosis, peripheral neuropathy, obesity, COPD, avascular necrosis, and chronic pain who presents to the ED for Austin medication refill of oxycodone 10 mg due to her chronic left hip pain.  Patient reports she ran out of of her pain medicine 2 days ago.  She reports her primary care physician refuses to prescribe this medication.  She notes she has been seen by multiple orthopedic surgeons who indicate Austin needed hip replacement but has discontinued her care due to her compliance with smoking cessation.  Patient denies recent injury or trauma, no recent falls.  Patient has been seen for similar pain and medication refill.  Patient reports pain is unchanged and is not getting better.  Patient reports she is unable to walk and uses Austin walker at home.  Denies chest pain, shortness of breath, fever, abdominal pain, loss of bladder and bowel dysfunction, weakness.  Physical Exam   Triage Vital Signs: ED Triage Vitals  Enc Vitals Group     BP 08/19/22 1323 (!) 118/103     Pulse Rate 08/19/22 1323 79     Resp 08/19/22 1323 20     Temp 08/19/22 1323 98 F (36.7 C)     Temp Source 08/19/22 1323 Oral     SpO2 08/19/22 1323 100 %     Weight 08/19/22 1324 229 lb (103.9 kg)     Height 08/19/22 1324 5\' 1"  (1.549 m)     Head Circumference --      Peak Flow --      Pain Score 08/19/22 1324 10     Pain Loc --      Pain Edu? --      Excl. in GC? --     Most recent vital signs: Vitals:   08/19/22 1323 08/19/22 1600  BP: (!) 118/103 133/78  Pulse: 79 71  Resp: 20 18  Temp: 98 F (36.7 C) 97.9 F (36.6 C)  SpO2: 100% 100%    General Awake, no distress.  Obvious discomfort. HEENT NCAT. PERRL. EOMI. No  rhinorrhea. Mucous membranes are moist.  CV:  Good peripheral perfusion. RRR RESP:  Normal effort.  LCTAB ABD:  No distention.  Soft, non tender. Other:  Left hip upon inspection reveals no ecchymosis, rash, lesions.  Diffuse TTP.  Absent active range of motion due to pain.  Passive range of motion with hip flexion not tolerated due to pain.  Full active range of motion of right hip.  Neurovascular status intact all throughout.  Patient is not ambulatory at this time.  Baseline is with walker per patient    ED Results / Procedures / Treatments   Labs (all labs ordered are listed, but only abnormal results are displayed) Labs Reviewed - No data to display  History and physical examination do not warrant Austin lab work up or imaging at this time.  No results found.   PROCEDURES:  Critical Care performed: No  Procedures   MEDICATIONS ORDERED IN ED: Medications  oxyCODONE (Oxy IR/ROXICODONE) immediate release tablet 10 mg (10 mg Oral Given 08/19/22 1436)     IMPRESSION / MDM / ASSESSMENT AND PLAN / ED COURSE  I reviewed the triage vital  signs and the nursing notes.                              Clinical Course as of 08/19/22 Nicole Austin Aug 19, 2022  1414 Chronic left hip pain since December. Need of hip replacement. Stabbing constant, sharp pains come and go. Radiation descending. Hx sciatica.  DrAberman referred to Dr Everlean Cherry in [place] but has to stop smoking. Pt not compliant.   Oxycodone 10mg  Nicole Austin rehabilitation.   PCP refuses to prescribe pain. Nicole Austin  [MH]    Clinical Course User Index [MH] Nicole Reap A, PA-C    61 y.o. female presents to the emergency department for evaluation and treatment of chronic left hip pain and request for medication refill. See HPI for further details.   Extensive chart review performed.  Patient history includes chronic left hip pain since December after Austin mechanical fall.  In April of this year patient had Austin another fall  and was seen in the ED for similar pain.  Unchanged pain, however pain is not resolving with medication.  Patient reports she was [facility] for PT and further management of this hip pain and was prescribed Austin package dose of oxycodone 10 mg. Patient reports she has tried to refill this medication with her primary care physician but is recommended to manage her pain under [place] pain specialists.  I discussed with patient emergency department protocol for narcotic pain management prescriptions stating that Austin low-dose can be provided to her for this visit but she is needed to find and follow-up with pain management.  Patient given at home oxycodone dosage for pain in ED of 10 mg.   Patient is in stable condition for discharge and outpatient follow up. Patient will be discharged home with prescriptions for oxycodone 10 mg for her pain.. Patient is to follow up with referral of pain management to follow-up in the next week. Patient is given ED precautions to return to the ED for any worsening or new symptoms. Patient verbalizes understanding. All questions and concerns were addressed during ED visit.    Patient's presentation is most consistent with severe exacerbation of chronic illness.  FINAL CLINICAL IMPRESSION(S) / ED DIAGNOSES   Final diagnoses:  Encounter for medication refill  Left hip pain    Rx / DC Orders   ED Discharge Orders          Ordered    oxyCODONE 10 MG TABS  Every 8 hours PRN        08/19/22 1546             Note:  This document was prepared using Dragon voice recognition software and may include unintentional dictation errors.    Romeo Apple, Nicole Pickney A, PA-C 08/19/22 1759    Georga Hacking, MD 08/19/22 1859    Georga Hacking, MD 08/21/22 [phone]

## 2022-08-19 NOTE — ED Triage Notes (Signed)
Pt states coming in with left hip pain. Pt states pain since December, but ran out of pain medications 2 days ago. Pt states she is supposed to get a hip replacement, but unable to walk due to the pain.  Medication was 10mg  of oxycodone

## 2022-08-20 ENCOUNTER — Other Ambulatory Visit: Payer: Self-pay | Admitting: Family Medicine

## 2022-08-20 NOTE — Telephone Encounter (Signed)
Corrie Dandy (a Pharm Tech) with [facility] has called to check the status of this refill for patient and to make sure the request was received for medication below, faxed on 08/17/2022. Corrie Dandy is aware this refill is being worked on.   atorvastatin (LIPITOR) tablet 40 mg   [[id number]]   [facility] - [place], [place] - [address]  Phone: [phone] Fax: [phone]    Mary's department with [facility] call back #: [phone]

## 2022-08-20 NOTE — Telephone Encounter (Signed)
Requested medications are due for refill today.  unsure  Requested medications are on the active medications list.  no  Last refill. never  Future visit scheduled.   yes  Notes to clinic.  Med is not on med list.    Requested Prescriptions  Pending Prescriptions Disp Refills   atorvastatin (LIPITOR) 40 MG tablet [Pharmacy Med Name: ATORVASTATIN CALCIUM 40MG  TABLET] 90 tablet 3    Sig: TAKE ONE TABLET BY MOUTH EVERY NIGHT AT BEDTIME     Cardiovascular:  Antilipid - Statins Failed - 08/17/2022  2:51 PM      Failed - Lipid Panel in normal range within the last 12 months    Cholesterol  Date Value Ref Range Status  12/07/2019 117 <200 mg/dL Final  46/96/2952 841 0 - 200 mg/dL Final   Ldl Cholesterol, Calc  Date Value Ref Range Status  10/21/2013 98 0 - 100 mg/dL Final   LDL Cholesterol (Calc)  Date Value Ref Range Status  12/07/2019 58 mg/dL (calc) Final    Comment:    Reference range: <100 . Desirable range <100 mg/dL for primary prevention;   <70 mg/dL for patients with CHD or diabetic patients  with > or = 2 CHD risk factors. Marland Kitchen LDL-C is now calculated using the Martin-Hopkins  calculation, which is a validated novel method providing  better accuracy than the Friedewald equation in the  estimation of LDL-C.  Horald Pollen et al. Lenox Ahr. 3244;010(27): 2061-2068  (http://education.[facility].com/faq/FAQ164)    HDL Cholesterol  Date Value Ref Range Status  10/21/2013 35 (L) 40 - 60 mg/dL Final   HDL  Date Value Ref Range Status  12/07/2019 31 (L) > OR = 50 mg/dL Final   Triglycerides  Date Value Ref Range Status  12/07/2019 216 (H) <150 mg/dL Final    Comment:    . If a non-fasting specimen was collected, consider repeat triglyceride testing on a fasting specimen if clinically indicated.  Perry Mount et al. J. of Clin. Lipidol. 2015;9:129-169. .   10/21/2013 182 0 - 200 mg/dL Final         Passed - Patient is not pregnant      Passed - Valid encounter  within last 12 months    Recent Outpatient Visits           2 weeks ago Encounter for examination following treatment at hospital   [facility] Danelle Berry, PA-C   2 months ago [facility]   [facility] Margarita Mail, DO   2 months ago [facility]   [facility] Danelle Berry, PA-C   3 months ago Encounter for preoperative assessment   [facility] Danelle Berry, PA-C   3 months ago Encounter for preoperative assessment   [facility] Danelle Berry, PA-C       Future Appointments             In 1 week Danelle Berry, PA-C [facility], [facility]

## 2022-08-20 NOTE — Telephone Encounter (Incomplete)
Medication Refill - Medication: Oxycodone 10 mg IR or something for pain  Has the patient contacted their pharmacy? Yes.   (Agent: If no, request that the patient contact the pharmacy for the refill. If patient does not wish to contact the pharmacy document the reason why and proceed with request.) (Agent: If yes, when and what did the pharmacy advise?)  Preferred Pharmacy (with phone number or street name): [facility] Has the patient been seen for an appointment in the last year OR does the patient have an upcoming appointment? {yes [id number]}  Agent: Please be advised that RX refills may take up to 3 business days. We ask that you follow-up with your pharmacy.

## 2022-08-21 ENCOUNTER — Telehealth: Payer: Self-pay | Admitting: Family Medicine

## 2022-08-21 NOTE — Telephone Encounter (Signed)
Requested medications are due for refill today.  yes  Requested medications are on the active medications list.  yes  Last refill. 07/30/2022  Future visit scheduled.   yes  Notes to clinic.  Refill not delegated. Rx signed by Kern Reap.    Requested Prescriptions  Pending Prescriptions Disp Refills   Oxycodone HCl 10 MG TABS 5 tablet 0    Sig: Take 1 tablet (10 mg total) by mouth every 8 (eight) hours as needed for up to 5 days.     Not Delegated - Analgesics:  Opioid Agonists Failed - 08/20/2022 12:16 PM      Failed - This refill cannot be delegated      Passed - Urine Drug Screen completed in last 360 days      Passed - Valid encounter within last 3 months    Recent Outpatient Visits           3 weeks ago Encounter for examination following treatment at hospital   [facility] Danelle Berry, PA-C   2 months ago [facility]   [facility] Margarita Mail, DO   2 months ago [facility]   [facility] Danelle Berry, PA-C   3 months ago Encounter for preoperative assessment   [facility] Danelle Berry, PA-C   3 months ago Encounter for preoperative assessment   [facility] Danelle Berry, PA-C       Future Appointments             In 6 days Danelle Berry, PA-C [facility], [facility]

## 2022-08-21 NOTE — Telephone Encounter (Signed)
Med request has been discontinued and was given by a provider not from this practice recently. Pt can discuss this at appointment time. Pt is aware

## 2022-08-21 NOTE — Telephone Encounter (Signed)
Medication Refill - Medication: Atorvastatin 40 MG 3 month supply with 3 refills   Pt has been out for over a week, has an appt on Monday with PCP. Medicine was prescribed last year by Dr. Caralee Ates but most recently by a different provider.   Has the patient contacted their pharmacy? Yes.   (Agent: If no, request that the patient contact the pharmacy for the refill. If patient does not wish to contact the pharmacy document the reason why and proceed with request.) (Agent: If yes, when and what did the pharmacy advise?)  Preferred Pharmacy (with phone number or street name):  [facility] - [place], [place] - [address]  [address] [place] [address]  Phone: [phone] Fax: [phone]   Has the patient been seen for an appointment in the last year OR does the patient have an upcoming appointment? Yes.    Agent: Please be advised that RX refills may take up to 3 business days. We ask that you follow-up with your pharmacy.

## 2022-08-22 ENCOUNTER — Telehealth: Payer: Self-pay | Admitting: *Deleted

## 2022-08-22 ENCOUNTER — Telehealth: Payer: Self-pay | Admitting: Family Medicine

## 2022-08-22 DIAGNOSIS — R7303 Prediabetes: Secondary | ICD-10-CM | POA: Diagnosis not present

## 2022-08-22 DIAGNOSIS — E871 Hypo-osmolality and hyponatremia: Secondary | ICD-10-CM | POA: Diagnosis not present

## 2022-08-22 DIAGNOSIS — T796XXD Traumatic ischemia of muscle, subsequent encounter: Secondary | ICD-10-CM | POA: Diagnosis not present

## 2022-08-22 DIAGNOSIS — G4730 Sleep apnea, unspecified: Secondary | ICD-10-CM | POA: Diagnosis not present

## 2022-08-22 DIAGNOSIS — M109 Gout, unspecified: Secondary | ICD-10-CM | POA: Diagnosis not present

## 2022-08-22 DIAGNOSIS — M797 Fibromyalgia: Secondary | ICD-10-CM | POA: Diagnosis not present

## 2022-08-22 DIAGNOSIS — M87851 Other osteonecrosis, right femur: Secondary | ICD-10-CM | POA: Diagnosis not present

## 2022-08-22 DIAGNOSIS — B372 Candidiasis of skin and nail: Secondary | ICD-10-CM | POA: Diagnosis not present

## 2022-08-22 DIAGNOSIS — I69331 Monoplegia of upper limb following cerebral infarction affecting right dominant side: Secondary | ICD-10-CM | POA: Diagnosis not present

## 2022-08-22 DIAGNOSIS — M810 Age-related osteoporosis without current pathological fracture: Secondary | ICD-10-CM | POA: Diagnosis not present

## 2022-08-22 DIAGNOSIS — E785 Hyperlipidemia, unspecified: Secondary | ICD-10-CM | POA: Diagnosis not present

## 2022-08-22 DIAGNOSIS — M87852 Other osteonecrosis, left femur: Secondary | ICD-10-CM | POA: Diagnosis not present

## 2022-08-22 DIAGNOSIS — G20A1 Parkinson's disease without dyskinesia, without mention of fluctuations: Secondary | ICD-10-CM | POA: Diagnosis not present

## 2022-08-22 DIAGNOSIS — I2510 Atherosclerotic heart disease of native coronary artery without angina pectoris: Secondary | ICD-10-CM | POA: Diagnosis not present

## 2022-08-22 DIAGNOSIS — G2581 Restless legs syndrome: Secondary | ICD-10-CM | POA: Diagnosis not present

## 2022-08-22 DIAGNOSIS — G8929 Other chronic pain: Secondary | ICD-10-CM | POA: Diagnosis not present

## 2022-08-22 DIAGNOSIS — K219 Gastro-esophageal reflux disease without esophagitis: Secondary | ICD-10-CM | POA: Diagnosis not present

## 2022-08-22 DIAGNOSIS — I252 Old myocardial infarction: Secondary | ICD-10-CM | POA: Diagnosis not present

## 2022-08-22 DIAGNOSIS — M1612 Unilateral primary osteoarthritis, left hip: Secondary | ICD-10-CM | POA: Diagnosis not present

## 2022-08-22 NOTE — Progress Notes (Signed)
  Care Coordination  Outreach Note  08/22/2022 Name: Nicole Austin MRN: [id number] DOB: 1960/06/02   Care Coordination Outreach Attempts: An unsuccessful telephone outreach was attempted today to offer the patient information about available care coordination services.  Follow Up Plan:  Additional outreach attempts will be made to offer the patient care coordination information and services.   Encounter Outcome:  No Answer  Burman Nieves, CCMA Care Coordination Care Guide Direct Dial: [phone]

## 2022-08-22 NOTE — Telephone Encounter (Signed)
Pt is calling in because she is requesting her pcp to send another prescription for  oxyCODONE 10 MG TABS [[id number]] or a different pain medication. Pt says she is in pain and ran out of medication. Please advise.

## 2022-08-23 ENCOUNTER — Encounter (HOSPITAL_COMMUNITY): Payer: Self-pay

## 2022-08-23 ENCOUNTER — Emergency Department (HOSPITAL_COMMUNITY)
Admission: EM | Admit: 2022-08-23 | Discharge: 2022-08-23 | Disposition: A | Discharge status: Home / Self Care | Payer: 59 | Attending: Emergency Medicine | Admitting: Emergency Medicine

## 2022-08-23 ENCOUNTER — Ambulatory Visit: Payer: 59 | Admitting: Nurse Practitioner

## 2022-08-23 DIAGNOSIS — G8929 Other chronic pain: Secondary | ICD-10-CM | POA: Insufficient documentation

## 2022-08-23 DIAGNOSIS — Z96642 Presence of left artificial hip joint: Secondary | ICD-10-CM | POA: Insufficient documentation

## 2022-08-23 DIAGNOSIS — Z743 Need for continuous supervision: Secondary | ICD-10-CM | POA: Diagnosis not present

## 2022-08-23 DIAGNOSIS — J449 Chronic obstructive pulmonary disease, unspecified: Secondary | ICD-10-CM | POA: Diagnosis not present

## 2022-08-23 DIAGNOSIS — M25559 Pain in unspecified hip: Secondary | ICD-10-CM | POA: Diagnosis not present

## 2022-08-23 DIAGNOSIS — M25552 Pain in left hip: Secondary | ICD-10-CM | POA: Insufficient documentation

## 2022-08-23 DIAGNOSIS — M810 Age-related osteoporosis without current pathological fracture: Secondary | ICD-10-CM | POA: Diagnosis not present

## 2022-08-23 MED ORDER — DICLOFENAC SODIUM 50 MG PO TBEC: 50.0000 mg | DELAYED_RELEASE_TABLET | Freq: Two times a day (BID) | ORAL | 0 refills | Status: DC

## 2022-08-23 MED ORDER — OXYCODONE-ACETAMINOPHEN 5-325 MG PO TABS
1.0000 | ORAL_TABLET | Freq: Once | ORAL | Status: AC
  Administered 2022-08-23: 1 via ORAL
  Filled 2022-08-23: qty 1

## 2022-08-23 MED ORDER — OXYCODONE HCL 5 MG PO TABS
10.0000 mg | ORAL_TABLET | Freq: Once | ORAL | Status: AC
  Administered 2022-08-23: 10 mg via ORAL
  Filled 2022-08-23: qty 2

## 2022-08-23 NOTE — Discharge Instructions (Addendum)
Thank you for allowing me to be a part of your care today.  You were evaluated in the ED for chronic hip pain.   Unfortunately, we will not be able to keep prescribing you narcotic pain medicine.  I am changing your meloxicam to a different anti-inflammatory to help with discomfort.  I also recommend using lidocaine patches and continuing to use your other topical medicines to help.   Be sure to always walk with your assistive devices so that you are more stable on your feet and to reduce the risk of a fall.    Please follow up with your primary care provider as scheduled.  I encourage you to continue trying to get in with pain management as this may be the best course of action until you are able to get surgery.    Return to the ED if you develop sudden worsening of your symptoms, sustain a fall or other acute injury, or if you have any new concerns.

## 2022-08-23 NOTE — ED Provider Triage Note (Signed)
Emergency Medicine Provider Triage Evaluation Note  Nicole Austin, a 62 y.o. female  was evaluated in triage.  Pt complains of hip pain.  Pain in the left hip.  Has been going on for several months.  Was scheduled for hip replacement December 23 but has been rescheduled multiple times.  Stated pain is much worse which prompted her evaluation today.  Denies recent trauma.  Review of Systems  Positive: See above Negative: See above  Physical Exam  BP (!) 155/68 (BP Location: Left Arm)   Pulse 65   Temp 97.8 F (36.6 C) (Oral)   Resp 18   Ht 5\' 1"  (1.549 m)   Wt 103 kg   LMP  (LMP Unknown)   SpO2 100%   BMI 42.91 kg/m  Gen:   Awake, no distress   Resp:  Normal effort  MSK:   Moves extremities without difficulty  Other:    Medical Decision Making  Medically screening exam initiated at 4:42 PM.  Appropriate orders placed.  Copper Kirtley Causer was informed that the remainder of the evaluation will be completed by another provider, this initial triage assessment does not replace that evaluation, and the importance of remaining in the ED until their evaluation is complete.  Work up started   Gareth Eagle, [place] 08/23/22 1644

## 2022-08-23 NOTE — Telephone Encounter (Signed)
Pt needs appt to discuss about her pain.

## 2022-08-23 NOTE — ED Provider Notes (Incomplete)
[place] EMERGENCY DEPARTMENT AT [facility] Provider Note   CSN: [id number] Arrival date & time: 08/23/22  1623     History {Add pertinent medical, surgical, social history, OB history to HPI:1} Chief Complaint  Patient presents with  . Hip Pain    Nicole Austin is a 62 y.o. female with past medical history significant for arthritis, chronic left hip pain, bipolar 1 disorder, depression, gout        Home Medications Prior to Admission medications   Medication Sig Start Date End Date Taking? Authorizing Provider  acetaminophen (TYLENOL) 500 MG tablet Take 2 tablets (1,000 mg total) by mouth every 8 (eight) hours as needed for moderate pain, mild pain, fever or headache. 07/10/22   Alford Highland, MD  albuterol (VENTOLIN HFA) 108 (90 Base) MCG/ACT inhaler Inhale 2 puffs into the lungs every 4 (four) hours as needed for wheezing or shortness of breath. 11/08/21   Danelle Berry, PA-C  ALPRAZolam Prudy Feeler) 0.5 MG tablet Take 1 tablet (0.5 mg total) by mouth 2 (two) times daily. 07/10/22   Alford Highland, MD  cyclobenzaprine (FLEXERIL) 5 MG tablet Take 1 tablet (5 mg total) by mouth 3 (three) times daily as needed for muscle spasms. 07/10/22   Alford Highland, MD  divalproex (DEPAKOTE) 250 MG DR tablet Take 1 tablet (250 mg total) by mouth 2 (two) times daily. 11/19/16   Brandy Hale, MD  escitalopram (LEXAPRO) 20 MG tablet Take 1 tablet (20 mg total) by mouth daily. 01/05/21 07/04/22  [place], Alvira Philips, DO  furosemide (LASIX) 20 MG tablet Take 0.5-1 tablets (10-20 mg total) by mouth daily as needed for fluid or edema. Take in am with potassium PRN 08/06/22   Danelle Berry, PA-C  Lurasidone HCl (LATUDA) 60 MG TABS Take 1 tablet (60 mg total) by mouth daily with breakfast. 07/10/22   Alford Highland, MD  meloxicam (MOBIC) 15 MG tablet Take 15 mg by mouth daily.    [provider]  nystatin (MYCOSTATIN/NYSTOP) powder Apply 1 Application topically 3 (three) times daily.  06/20/22   Margarita Mail, DO  oxyCODONE 10 MG TABS Take 1 tablet (10 mg total) by mouth every 8 (eight) hours as needed for up to 5 days for severe pain. 08/19/22 08/24/22  Romeo Apple, Myah A, PA-C  polyethylene glycol (MIRALAX / GLYCOLAX) 17 g packet Take 17 g by mouth daily. 07/11/22   Alford Highland, MD  potassium chloride SA (KLOR-CON M) 20 MEQ tablet Take 1 tablet (20 mEq total) by mouth daily as needed (take potassium supplement when taking lasix for EDEMA). 08/06/22   Danelle Berry, PA-C  rOPINIRole (REQUIP XL) 4 MG 24 hr tablet Take 4 mg by mouth at bedtime.    [provider]  senna-docusate (SENOKOT-S) 8.6-50 MG tablet Take 1 tablet by mouth 2 (two) times daily. 04/10/22   Marrion Coy, MD  Tiotropium Bromide Monohydrate (SPIRIVA RESPIMAT) 2.5 MCG/ACT AERS Inhale 2 puffs into the lungs daily. 11/21/21   Sharyn Creamer, MD      Allergies    Chantix [varenicline] and Glycopyrrolate    Review of Systems   Review of Systems  Musculoskeletal:  Positive for arthralgias and gait problem. Negative for joint swelling.    Physical Exam Updated Vital Signs BP 129/74   Pulse 76   Temp 97.8 F (36.6 C) (Oral)   Resp 20   Ht 5\' 1"  (1.549 m)   Wt 103 kg   LMP  (LMP Unknown)   SpO2 96%   BMI  42.91 kg/m  Physical Exam  ED Results / Procedures / Treatments   Labs (all labs ordered are listed, but only abnormal results are displayed) Labs Reviewed - No data to display  EKG None  Radiology No results found.  Procedures Procedures  {Document cardiac monitor, telemetry assessment procedure when appropriate:1}  Medications Ordered in ED Medications  oxyCODONE (Oxy IR/ROXICODONE) immediate release tablet 10 mg (has no administration in time range)  oxyCODONE-acetaminophen (PERCOCET/ROXICET) 5-325 MG per tablet 1 tablet (1 tablet Oral Given 08/23/22 1736)    ED Course/ Medical Decision Making/ A&P   {   Click here for ABCD2, HEART and other calculatorsREFRESH Note before  signing :1}                          Medical Decision Making Risk Prescription drug management.   ***  {Document critical care time when appropriate:1} {Document review of labs and clinical decision tools ie heart score, Chads2Vasc2 etc:1}  {Document your independent review of radiology images, and any outside records:1} {Document your discussion with family members, caretakers, and with consultants:1} {Document social determinants of health affecting pt's care:1} {Document your decision making why or why not admission, treatments were needed:1} Final Clinical Impression(s) / ED Diagnoses Final diagnoses:  None    Rx / DC Orders ED Discharge Orders     None

## 2022-08-23 NOTE — ED Provider Notes (Signed)
[place] EMERGENCY DEPARTMENT AT [facility] Provider Note   CSN: [id number] Arrival date & time: 08/23/22  1623     History  Chief Complaint  Patient presents with   Hip Pain    Nicole Austin is a 62 y.o. female with past medical history significant for arthritis, chronic left hip pain, bipolar 1 disorder, depression, gout presents to the ED complaining of left hip pain.  Patient has been evaluated in the ED prior for same symptoms and was giving pain medicine.  She states she has run out of pain medicine and can't be seen by her PCP until 09/03/22.  Patient states that she has been using topical medications and does take meloxicam daily, but this does not help with her pain.  Patient has had her left hip replacement rescheduled multiple times since December 2023.  She does have home health established.  She reports that she has had difficulty with her ADLs due to the pain, but she is able to push through.  Denies any recent falls or other injury.       Home Medications Prior to Admission medications   Medication Sig Start Date End Date Taking? Authorizing Provider  diclofenac (VOLTAREN) 50 MG EC tablet Take 1 tablet (50 mg total) by mouth 2 (two) times daily. 08/23/22  Yes Marland Reine R, PA-C  acetaminophen (TYLENOL) 500 MG tablet Take 2 tablets (1,000 mg total) by mouth every 8 (eight) hours as needed for moderate pain, mild pain, fever or headache. 07/10/22   Alford Highland, MD  albuterol (VENTOLIN HFA) 108 (90 Base) MCG/ACT inhaler Inhale 2 puffs into the lungs every 4 (four) hours as needed for wheezing or shortness of breath. 11/08/21   Danelle Berry, PA-C  ALPRAZolam Prudy Feeler) 0.5 MG tablet Take 1 tablet (0.5 mg total) by mouth 2 (two) times daily. 07/10/22   Alford Highland, MD  cyclobenzaprine (FLEXERIL) 5 MG tablet Take 1 tablet (5 mg total) by mouth 3 (three) times daily as needed for muscle spasms. 07/10/22   Alford Highland, MD  divalproex (DEPAKOTE) 250 MG  DR tablet Take 1 tablet (250 mg total) by mouth 2 (two) times daily. 11/19/16   Brandy Hale, MD  escitalopram (LEXAPRO) 20 MG tablet Take 1 tablet (20 mg total) by mouth daily. 01/05/21 07/04/22  [place], Alvira Philips, DO  furosemide (LASIX) 20 MG tablet Take 0.5-1 tablets (10-20 mg total) by mouth daily as needed for fluid or edema. Take in am with potassium PRN 08/06/22   Danelle Berry, PA-C  Lurasidone HCl (LATUDA) 60 MG TABS Take 1 tablet (60 mg total) by mouth daily with breakfast. 07/10/22   Alford Highland, MD  nystatin (MYCOSTATIN/NYSTOP) powder Apply 1 Application topically 3 (three) times daily. 06/20/22   Margarita Mail, DO  oxyCODONE 10 MG TABS Take 1 tablet (10 mg total) by mouth every 8 (eight) hours as needed for up to 5 days for severe pain. 08/19/22 08/24/22  Romeo Apple, Myah A, PA-C  polyethylene glycol (MIRALAX / GLYCOLAX) 17 g packet Take 17 g by mouth daily. 07/11/22   Alford Highland, MD  potassium chloride SA (KLOR-CON M) 20 MEQ tablet Take 1 tablet (20 mEq total) by mouth daily as needed (take potassium supplement when taking lasix for EDEMA). 08/06/22   Danelle Berry, PA-C  rOPINIRole (REQUIP XL) 4 MG 24 hr tablet Take 4 mg by mouth at bedtime.    [provider]  senna-docusate (SENOKOT-S) 8.6-50 MG tablet Take 1 tablet by mouth 2 (two) times  daily. 04/10/22   Marrion Coy, MD  Tiotropium Bromide Monohydrate (SPIRIVA RESPIMAT) 2.5 MCG/ACT AERS Inhale 2 puffs into the lungs daily. 11/21/21   Sharyn Creamer, MD      Allergies    Chantix [varenicline] and Glycopyrrolate    Review of Systems   Review of Systems  Musculoskeletal:  Positive for arthralgias and gait problem. Negative for joint swelling.    Physical Exam Updated Vital Signs BP 133/71   Pulse 79   Temp 98 F (36.7 C)   Resp 18   Ht 5\' 1"  (1.549 m)   Wt 103 kg   LMP  (LMP Unknown)   SpO2 98%   BMI 42.91 kg/m  Physical Exam Vitals and nursing note reviewed.  Constitutional:      General: She is not in acute  distress.    Appearance: Normal appearance. She is not ill-appearing or diaphoretic.  Cardiovascular:     Rate and Rhythm: Normal rate and regular rhythm.  Pulmonary:     Effort: Pulmonary effort is normal.  Musculoskeletal:     Left hip: Tenderness and bony tenderness present. No deformity or crepitus. Decreased range of motion.  Skin:    General: Skin is warm and dry.     Capillary Refill: Capillary refill takes less than 2 seconds.  Neurological:     Mental Status: She is alert. Mental status is at baseline.  Psychiatric:        Mood and Affect: Mood is anxious. Affect is tearful.        Behavior: Behavior normal.     ED Results / Procedures / Treatments   Labs (all labs ordered are listed, but only abnormal results are displayed) Labs Reviewed - No data to display  EKG None  Radiology No results found.  Procedures Procedures    Medications Ordered in ED Medications  oxyCODONE-acetaminophen (PERCOCET/ROXICET) 5-325 MG per tablet 1 tablet (1 tablet Oral Given 08/23/22 1736)  oxyCODONE (Oxy IR/ROXICODONE) immediate release tablet 10 mg (10 mg Oral Given 08/23/22 2149)    ED Course/ Medical Decision Making/ A&P                             Medical Decision Making Risk Prescription drug management.   This patient presents to the ED with chief complaint(s) of left hip pain with pertinent past medical history of chronic hip pain, avascular necrosis of bilateral hips.  The complaint involves an extensive differential diagnosis and also carries with it a high risk of complications and morbidity.    The differential diagnosis includes acute on chronic left hip pain  Initial Assessment:   Exam significant for an anxious and tearful patient who is not in acute distress.  There is no obvious injury or gross deformities to bilateral hips.  She does have generalized tenderness to palpation of the left hip with decreased ROM.    Treatment and Reassessment: Patient given oral  pain medicine while in the ED with improvement in her symptoms.    Disposition:   Had a lengthy discussion with the patient regarding use of narcotic pain medicine for her chronic left hip pain.  Informed patient that we will not be prescribing her any more narcotics for her pain.  Discussed other treatment options including changing her prescription NSAID, which will send her home on diclofenac to replace meloxicam.  Recommended to patient that she continue to attempt to get into pain management, as this may be her  best option for ongoing pain control of her chronic hip problems.  Patient does have a follow-up appointment with her primary care provider on 09/03/2022.  Stressed the importance of patient keeping this appointment and following up with her PCP.  The patient has been appropriately medically screened and/or stabilized in the ED. I have low suspicion for any other emergent medical condition which would require further screening, evaluation or treatment in the ED or require inpatient management. At time of discharge the patient is hemodynamically stable and in no acute distress. I have discussed work-up results and diagnosis with patient and answered all questions. Patient is agreeable with discharge plan. We discussed strict return precautions for returning to the emergency department and they verbalized understanding.            Final Clinical Impression(s) / ED Diagnoses Final diagnoses:  Chronic left hip pain    Rx / DC Orders ED Discharge Orders          Ordered    diclofenac (VOLTAREN) 50 MG EC tablet  2 times daily        08/23/22 2212              Lenard Simmer, PA-C 08/24/22 0056    Linwood Dibbles, MD 08/24/22 [phone]

## 2022-08-23 NOTE — ED Notes (Signed)
Verbalizes understanding of discharge instructions. Verifies pharmacy. Ride present to transport patient home.

## 2022-08-23 NOTE — Telephone Encounter (Signed)
Pt was advised to seek UC or ER due to providers in office being booked all next week. Pt verbalized understanding. Pt requested pt advocate number did not know so I gave her [facility] main number to see if they could help her out.

## 2022-08-23 NOTE — Telephone Encounter (Signed)
Pt has an appointment on 7/1 and stated she will discuss it then

## 2022-08-23 NOTE — ED Triage Notes (Signed)
Pt is supposed to have left hip replacement on Dec 2023 but has been rescheduled multiple times. Pt has been seen by PCP and was prescribed oxycodone with relief. Pt ran out of pain meds and can't be seen by PCP until July 8. Here for worsening left hip pain. Ambulates with a walker.

## 2022-08-23 NOTE — Telephone Encounter (Signed)
Christa with [employer] called to report she has talked with pt. 08/20/22 and pt. Reports "no one will refill my pain medicine." States pain management will not prescribe her pain medicine. Christa reports pt. Has "pain all over." Practice has recommended to pt. Today to go to [facility] or ED for treatment.

## 2022-08-24 ENCOUNTER — Telehealth: Payer: Self-pay

## 2022-08-24 NOTE — Telephone Encounter (Signed)
Transition Care Management Follow-up Telephone Call Date of discharge and from where:  6/23 How have you been since you were released from the hospital? Same  Any questions or concerns? No  Items Reviewed: Did the pt receive and understand the discharge instructions provided? Yes  Medications obtained and verified? Yes  Other? No  Any new allergies since your discharge? No  Dietary orders reviewed? No Do you have support at home? Yes    Follow up appointments reviewed:  PCP Hospital f/u appt confirmed? Yes  Scheduled to see PCP on 7/8 @ . Specialist Hospital f/u appt confirmed? No  Scheduled to see  on  @ . Are transportation arrangements needed? No  If their condition worsens, is the pt aware to call PCP or go to the Emergency Dept.? Yes Was the patient provided with contact information for the PCP's office or ED? Yes Was to pt encouraged to call back with questions or concerns? Yes

## 2022-08-27 ENCOUNTER — Ambulatory Visit: Payer: 59 | Admitting: Family Medicine

## 2022-08-27 DIAGNOSIS — M797 Fibromyalgia: Secondary | ICD-10-CM | POA: Diagnosis not present

## 2022-08-27 DIAGNOSIS — R7303 Prediabetes: Secondary | ICD-10-CM | POA: Diagnosis not present

## 2022-08-27 DIAGNOSIS — G8929 Other chronic pain: Secondary | ICD-10-CM | POA: Diagnosis not present

## 2022-08-27 DIAGNOSIS — M87059 Idiopathic aseptic necrosis of unspecified femur: Secondary | ICD-10-CM | POA: Diagnosis not present

## 2022-08-27 DIAGNOSIS — M1612 Unilateral primary osteoarthritis, left hip: Secondary | ICD-10-CM | POA: Diagnosis not present

## 2022-08-27 DIAGNOSIS — G2581 Restless legs syndrome: Secondary | ICD-10-CM | POA: Diagnosis not present

## 2022-08-27 DIAGNOSIS — I69331 Monoplegia of upper limb following cerebral infarction affecting right dominant side: Secondary | ICD-10-CM | POA: Diagnosis not present

## 2022-08-27 DIAGNOSIS — B372 Candidiasis of skin and nail: Secondary | ICD-10-CM | POA: Diagnosis not present

## 2022-08-27 DIAGNOSIS — G20A1 Parkinson's disease without dyskinesia, without mention of fluctuations: Secondary | ICD-10-CM | POA: Diagnosis not present

## 2022-08-27 DIAGNOSIS — T796XXD Traumatic ischemia of muscle, subsequent encounter: Secondary | ICD-10-CM | POA: Diagnosis not present

## 2022-08-27 DIAGNOSIS — G4730 Sleep apnea, unspecified: Secondary | ICD-10-CM | POA: Diagnosis not present

## 2022-08-27 DIAGNOSIS — M109 Gout, unspecified: Secondary | ICD-10-CM | POA: Diagnosis not present

## 2022-08-27 DIAGNOSIS — E785 Hyperlipidemia, unspecified: Secondary | ICD-10-CM | POA: Diagnosis not present

## 2022-08-27 DIAGNOSIS — M810 Age-related osteoporosis without current pathological fracture: Secondary | ICD-10-CM | POA: Diagnosis not present

## 2022-08-27 DIAGNOSIS — I2510 Atherosclerotic heart disease of native coronary artery without angina pectoris: Secondary | ICD-10-CM | POA: Diagnosis not present

## 2022-08-27 DIAGNOSIS — E871 Hypo-osmolality and hyponatremia: Secondary | ICD-10-CM | POA: Diagnosis not present

## 2022-08-27 DIAGNOSIS — M87851 Other osteonecrosis, right femur: Secondary | ICD-10-CM | POA: Diagnosis not present

## 2022-08-27 DIAGNOSIS — K219 Gastro-esophageal reflux disease without esophagitis: Secondary | ICD-10-CM | POA: Diagnosis not present

## 2022-08-27 DIAGNOSIS — M87852 Other osteonecrosis, left femur: Secondary | ICD-10-CM | POA: Diagnosis not present

## 2022-08-27 DIAGNOSIS — I252 Old myocardial infarction: Secondary | ICD-10-CM | POA: Diagnosis not present

## 2022-09-03 ENCOUNTER — Ambulatory Visit: Payer: 59 | Admitting: Family Medicine

## 2022-09-03 DIAGNOSIS — M1612 Unilateral primary osteoarthritis, left hip: Secondary | ICD-10-CM | POA: Diagnosis not present

## 2022-09-03 DIAGNOSIS — T796XXD Traumatic ischemia of muscle, subsequent encounter: Secondary | ICD-10-CM | POA: Diagnosis not present

## 2022-09-03 DIAGNOSIS — I252 Old myocardial infarction: Secondary | ICD-10-CM | POA: Diagnosis not present

## 2022-09-03 DIAGNOSIS — B372 Candidiasis of skin and nail: Secondary | ICD-10-CM | POA: Diagnosis not present

## 2022-09-03 DIAGNOSIS — G8929 Other chronic pain: Secondary | ICD-10-CM | POA: Diagnosis not present

## 2022-09-03 DIAGNOSIS — G4730 Sleep apnea, unspecified: Secondary | ICD-10-CM | POA: Diagnosis not present

## 2022-09-03 DIAGNOSIS — I2510 Atherosclerotic heart disease of native coronary artery without angina pectoris: Secondary | ICD-10-CM | POA: Diagnosis not present

## 2022-09-03 DIAGNOSIS — M87852 Other osteonecrosis, left femur: Secondary | ICD-10-CM | POA: Diagnosis not present

## 2022-09-03 DIAGNOSIS — M797 Fibromyalgia: Secondary | ICD-10-CM | POA: Diagnosis not present

## 2022-09-03 DIAGNOSIS — M87851 Other osteonecrosis, right femur: Secondary | ICD-10-CM | POA: Diagnosis not present

## 2022-09-03 DIAGNOSIS — I69331 Monoplegia of upper limb following cerebral infarction affecting right dominant side: Secondary | ICD-10-CM | POA: Diagnosis not present

## 2022-09-03 DIAGNOSIS — G2581 Restless legs syndrome: Secondary | ICD-10-CM | POA: Diagnosis not present

## 2022-09-03 DIAGNOSIS — K219 Gastro-esophageal reflux disease without esophagitis: Secondary | ICD-10-CM | POA: Diagnosis not present

## 2022-09-03 DIAGNOSIS — M810 Age-related osteoporosis without current pathological fracture: Secondary | ICD-10-CM | POA: Diagnosis not present

## 2022-09-03 DIAGNOSIS — E785 Hyperlipidemia, unspecified: Secondary | ICD-10-CM | POA: Diagnosis not present

## 2022-09-03 DIAGNOSIS — G20A1 Parkinson's disease without dyskinesia, without mention of fluctuations: Secondary | ICD-10-CM | POA: Diagnosis not present

## 2022-09-03 DIAGNOSIS — E871 Hypo-osmolality and hyponatremia: Secondary | ICD-10-CM | POA: Diagnosis not present

## 2022-09-03 DIAGNOSIS — R7303 Prediabetes: Secondary | ICD-10-CM | POA: Diagnosis not present

## 2022-09-03 DIAGNOSIS — M109 Gout, unspecified: Secondary | ICD-10-CM | POA: Diagnosis not present

## 2022-09-03 DIAGNOSIS — J449 Chronic obstructive pulmonary disease, unspecified: Secondary | ICD-10-CM | POA: Diagnosis not present

## 2022-09-04 ENCOUNTER — Emergency Department
Admission: EM | Admit: 2022-09-04 | Discharge: 2022-09-04 | Disposition: A | Discharge status: Home / Self Care | Payer: 59 | Attending: Emergency Medicine | Admitting: Emergency Medicine

## 2022-09-04 ENCOUNTER — Emergency Department: Payer: 59

## 2022-09-04 ENCOUNTER — Other Ambulatory Visit: Payer: Self-pay

## 2022-09-04 ENCOUNTER — Ambulatory Visit: Payer: Self-pay

## 2022-09-04 DIAGNOSIS — J441 Chronic obstructive pulmonary disease with (acute) exacerbation: Secondary | ICD-10-CM | POA: Diagnosis not present

## 2022-09-04 DIAGNOSIS — J449 Chronic obstructive pulmonary disease, unspecified: Secondary | ICD-10-CM | POA: Insufficient documentation

## 2022-09-04 DIAGNOSIS — G8929 Other chronic pain: Secondary | ICD-10-CM

## 2022-09-04 DIAGNOSIS — M25552 Pain in left hip: Secondary | ICD-10-CM | POA: Insufficient documentation

## 2022-09-04 DIAGNOSIS — I2510 Atherosclerotic heart disease of native coronary artery without angina pectoris: Secondary | ICD-10-CM | POA: Diagnosis not present

## 2022-09-04 DIAGNOSIS — Z743 Need for continuous supervision: Secondary | ICD-10-CM | POA: Diagnosis not present

## 2022-09-04 DIAGNOSIS — I878 Other specified disorders of veins: Secondary | ICD-10-CM | POA: Diagnosis not present

## 2022-09-04 DIAGNOSIS — R6889 Other general symptoms and signs: Secondary | ICD-10-CM | POA: Diagnosis not present

## 2022-09-04 DIAGNOSIS — M87052 Idiopathic aseptic necrosis of left femur: Secondary | ICD-10-CM | POA: Diagnosis not present

## 2022-09-04 DIAGNOSIS — M8700 Idiopathic aseptic necrosis of unspecified bone: Secondary | ICD-10-CM

## 2022-09-04 MED ORDER — OXYCODONE HCL 5 MG PO TABS: 5.0000 mg | ORAL_TABLET | Freq: Three times a day (TID) | ORAL | 0 refills | Status: DC | PRN

## 2022-09-04 MED ORDER — ACETAMINOPHEN 500 MG PO TABS
1000.0000 mg | ORAL_TABLET | Freq: Once | ORAL | Status: AC
  Administered 2022-09-04: 1000 mg via ORAL
  Filled 2022-09-04: qty 2

## 2022-09-04 MED ORDER — OXYCODONE HCL 5 MG PO TABS
5.0000 mg | ORAL_TABLET | Freq: Once | ORAL | Status: AC
  Administered 2022-09-04: 5 mg via ORAL
  Filled 2022-09-04: qty 1

## 2022-09-04 MED ORDER — IBUPROFEN 600 MG PO TABS
600.0000 mg | ORAL_TABLET | Freq: Once | ORAL | Status: AC
  Administered 2022-09-04: 600 mg via ORAL
  Filled 2022-09-04: qty 1

## 2022-09-04 NOTE — ED Provider Notes (Signed)
[facility] Provider Note    Event Date/Time   First MD Initiated Contact with Patient 09/04/22 1522     (approximate)   History   Hip Pain   HPI  Nicole Austin is a 62 y.o. female   Past medical history of chronic left hip pain, polysubstance use, CAD, COPD, here with left hip pain, atraumatic.  He has been able to ambulate.  She denies any swelling warmth or fever.  No other acute medical complaints.  Oxycodone has worked well in the past.   External Medical Documents Reviewed: Orthopedics note from February 2024 Dr. Audelia Acton for left hip pain for evaluation of left hip replacement      Physical Exam   Triage Vital Signs: ED Triage Vitals  Enc Vitals Group     BP 09/04/22 1453 (!) 149/78     Pulse Rate 09/04/22 1453 68     Resp 09/04/22 1453 16     Temp 09/04/22 1453 98.2 F (36.8 C)     Temp Source 09/04/22 1453 Oral     SpO2 09/04/22 1453 98 %     Weight 09/04/22 1454 226 lb (102.5 kg)     Height 09/04/22 1454 5\' 1"  (1.549 m)     Head Circumference --      Peak Flow --      Pain Score 09/04/22 1457 10     Pain Loc --      Pain Edu? --      Excl. in GC? --     Most recent vital signs: Vitals:   09/04/22 1453  BP: (!) 149/78  Pulse: 68  Resp: 16  Temp: 98.2 F (36.8 C)  SpO2: 98%    General: Awake, no distress.  CV:  Good peripheral perfusion.  Resp:  Normal effort.  Abd:  No distention.  Other:  Awake alert nontoxic normal vital signs afebrile, pain with passive motion of the left hip, no swelling or warmth of the affected joint, neurovascular intact distally.   ED Results / Procedures / Treatments   Labs (all labs ordered are listed, but only abnormal results are displayed) Labs Reviewed - No data to display    RADIOLOGY I independently reviewed and interpreted left hip x-ray and see no obvious fractures or dislocation   PROCEDURES:  Critical Care performed: No  Procedures   MEDICATIONS ORDERED IN  ED: Medications  ibuprofen (ADVIL) tablet 600 mg (has no administration in time range)  acetaminophen (TYLENOL) tablet 1,000 mg (has no administration in time range)  oxyCODONE (Oxy IR/ROXICODONE) immediate release tablet 5 mg (has no administration in time range)     IMPRESSION / MDM / ASSESSMENT AND PLAN / ED COURSE  I reviewed the triage vital signs and the nursing notes.                                Patient's presentation is most consistent with acute presentation with potential threat to life or bodily function.  Differential diagnosis includes, but is not limited to, septic joint, hip fracture dislocation, chronic hip pain due to osteoarthritis/avascular necrosis  MDM:   Chronic hip pain worsening without trauma or signs or symptoms of infection.  She has been ambulatory with walker.  Exam mostly benign no signs of infection, neurovascular intact, will offer pain medications and she has a scheduled hip replacement in August with Dr. Charlann Boxer.  X-ray pending final read, if  unremarkable plan for discharge with orthopedic follow-up and pain medications.       FINAL CLINICAL IMPRESSION(S) / ED DIAGNOSES   Final diagnoses:  Chronic hip pain, left     Rx / DC Orders   ED Discharge Orders          Ordered    oxyCODONE (ROXICODONE) 5 MG immediate release tablet  Every 8 hours PRN        09/04/22 1620             Note:  This document was prepared using Dragon voice recognition software and may include unintentional dictation errors.    Pilar Jarvis, MD 09/04/22 [phone]

## 2022-09-04 NOTE — Discharge Instructions (Signed)
Take acetaminophen 650 mg and ibuprofen 400 mg every 6 hours for pain.  Take with food. Take oxycodone as prescribed for severe/breakthrough pain only as we discussed.  Follow-up with your orthopedic doctor for an appointment and surgery as scheduled.  Thank you for choosing [place] for your health care today!  Please see your primary doctor this week for a follow up appointment.   If you have any new, worsening, or unexpected symptoms call your doctor right away or come back to the emergency department for reevaluation.  It was my pleasure to care for you today.   Daneil Dan Modesto Charon, MD

## 2022-09-04 NOTE — ED Triage Notes (Signed)
First Nurse Note;  Pt via [facility], pt has a hx of hip pain for the past year. States she keeps pushing her surgery, states her surgery is scheduled in August. EMS is able to walk with assistance.  160/64 BP  74 BP  99% on RA

## 2022-09-04 NOTE — Telephone Encounter (Signed)
    Chief Complaint: Pt. Complaining of severe left pain pain, crying, sobbing. "I don't have anyone to help me and I can't walk." Called 911 for pt. Symptoms: Pain, unable to walk Frequency: Worse today Pertinent Negatives: Patient denies  Disposition: [x] ED /[] Urgent Care (no appt availability in office) / [] Appointment(In office/virtual)/ []  [facility]/ [] Home Care/ [] Refused Recommended Disposition /[] [facility]/ []  Follow-up with PCP Additional Notes: Pt. Agrees to go to ED.  Reason for Disposition  Can't stand (bear weight) or walk  Answer Assessment - Initial Assessment Questions 1. LOCATION and RADIATION: "Where is the pain located?"      Left hip 2. QUALITY: "What does the pain feel like?"  (e.g., sharp, dull, aching, burning)     Sharp pain 3. SEVERITY: "How bad is the pain?" "What does it keep you from doing?"   (Scale 1-10; or mild, moderate, severe)   -  MILD (1-3): doesn't interfere with normal activities    -  MODERATE (4-7): interferes with normal activities (e.g., work or school) or awakens from sleep, limping    -  SEVERE (8-10): excruciating pain, unable to do any normal activities, unable to walk     Severe 4. ONSET: "When did the pain start?" "Does it come and go, or is it there all the time?"     Today 5. WORK OR EXERCISE: "Has there been any recent work or exercise that involved this part of the body?"      No 6. CAUSE: "What do you think is causing the hip pain?"      Hip arthritis 7. AGGRAVATING FACTORS: "What makes the hip pain worse?" (e.g., walking, climbing stairs, running)     Walking 8. OTHER SYMPTOMS: "Do you have any other symptoms?" (e.g., back pain, pain shooting down leg,  fever, rash)     No  Protocols used: Hip Pain-A-AH

## 2022-09-04 NOTE — ED Notes (Signed)
Pt helped to the bathroom to void.  Pt was able to pull herself up to transfer to toilet using grab bars but pt was in viaible pain and crying

## 2022-09-04 NOTE — ED Triage Notes (Signed)
Pt to ED for chronic left hip pain for over a year. States has been walking frequently. Has surgery scheduled. . Denies falls.

## 2022-09-05 ENCOUNTER — Ambulatory Visit: Payer: 59 | Admitting: Family Medicine

## 2022-09-06 DIAGNOSIS — J441 Chronic obstructive pulmonary disease with (acute) exacerbation: Secondary | ICD-10-CM | POA: Diagnosis not present

## 2022-09-06 DIAGNOSIS — J449 Chronic obstructive pulmonary disease, unspecified: Secondary | ICD-10-CM | POA: Diagnosis not present

## 2022-09-09 DIAGNOSIS — M25551 Pain in right hip: Secondary | ICD-10-CM | POA: Diagnosis not present

## 2022-09-09 DIAGNOSIS — G8929 Other chronic pain: Secondary | ICD-10-CM | POA: Insufficient documentation

## 2022-09-09 DIAGNOSIS — Z743 Need for continuous supervision: Secondary | ICD-10-CM | POA: Diagnosis not present

## 2022-09-09 DIAGNOSIS — M25552 Pain in left hip: Secondary | ICD-10-CM | POA: Diagnosis not present

## 2022-09-09 DIAGNOSIS — R6889 Other general symptoms and signs: Secondary | ICD-10-CM | POA: Diagnosis not present

## 2022-09-09 NOTE — ED Triage Notes (Signed)
First nurse note:  Patient BIB [facility] from home c/o left hip pain and unable to walk.  Patient has hip replacement schedule 8/13.  Patient has history of osteonecrosis.   20 L AC 75 mcg fentanyl  4 mg zofran CBG 99 67 HR 94% RA 155/100

## 2022-09-09 NOTE — ED Triage Notes (Signed)
Pt arrived from home BIB [facility] for continued left hip pian, pt is schedule to have hip surgery 8/13 at [facility], pain is getting worse that it is affecting her ability to get around at home. NAD noted, VSS.

## 2022-09-10 ENCOUNTER — Ambulatory Visit (INDEPENDENT_AMBULATORY_CARE_PROVIDER_SITE_OTHER): Payer: 59 | Admitting: Family Medicine

## 2022-09-10 ENCOUNTER — Emergency Department
Admission: EM | Admit: 2022-09-10 | Discharge: 2022-09-10 | Disposition: A | Discharge status: Home / Self Care | Payer: 59 | Attending: Emergency Medicine | Admitting: Emergency Medicine

## 2022-09-10 ENCOUNTER — Encounter: Payer: Self-pay | Admitting: Family Medicine

## 2022-09-10 VITALS — BP 154/84 | HR 67 | Temp 98.1°F | Resp 16 | Ht 61.0 in | Wt 222.0 lb

## 2022-09-10 DIAGNOSIS — Z0289 Encounter for other administrative examinations: Secondary | ICD-10-CM

## 2022-09-10 DIAGNOSIS — M179 Osteoarthritis of knee, unspecified: Secondary | ICD-10-CM | POA: Diagnosis not present

## 2022-09-10 DIAGNOSIS — E871 Hypo-osmolality and hyponatremia: Secondary | ICD-10-CM

## 2022-09-10 DIAGNOSIS — M797 Fibromyalgia: Secondary | ICD-10-CM | POA: Diagnosis not present

## 2022-09-10 DIAGNOSIS — G8929 Other chronic pain: Secondary | ICD-10-CM | POA: Diagnosis not present

## 2022-09-10 DIAGNOSIS — M87052 Idiopathic aseptic necrosis of left femur: Secondary | ICD-10-CM | POA: Diagnosis not present

## 2022-09-10 DIAGNOSIS — Z79899 Other long term (current) drug therapy: Secondary | ICD-10-CM

## 2022-09-10 DIAGNOSIS — Z789 Other specified health status: Secondary | ICD-10-CM

## 2022-09-10 DIAGNOSIS — R269 Unspecified abnormalities of gait and mobility: Secondary | ICD-10-CM | POA: Diagnosis not present

## 2022-09-10 DIAGNOSIS — M87051 Idiopathic aseptic necrosis of right femur: Secondary | ICD-10-CM

## 2022-09-10 DIAGNOSIS — G609 Hereditary and idiopathic neuropathy, unspecified: Secondary | ICD-10-CM

## 2022-09-10 DIAGNOSIS — J449 Chronic obstructive pulmonary disease, unspecified: Secondary | ICD-10-CM | POA: Diagnosis not present

## 2022-09-10 DIAGNOSIS — M25551 Pain in right hip: Secondary | ICD-10-CM | POA: Diagnosis not present

## 2022-09-10 DIAGNOSIS — F319 Bipolar disorder, unspecified: Secondary | ICD-10-CM

## 2022-09-10 MED ORDER — PREGABALIN 25 MG PO CAPS: 25.0000 mg | ORAL_CAPSULE | Freq: Two times a day (BID) | ORAL | 1 refills | Status: DC

## 2022-09-10 MED ORDER — OXYCODONE-ACETAMINOPHEN 5-325 MG PO TABS
2.0000 | ORAL_TABLET | Freq: Once | ORAL | Status: AC
  Administered 2022-09-10: 2 via ORAL
  Filled 2022-09-10: qty 2

## 2022-09-10 NOTE — Progress Notes (Signed)
Name: Nicole Austin   MRN: [id number]    DOB: May 28, 1960   Date:09/10/2022       Progress Note  Chief Complaint  Patient presents with   Form Completion    PCS forms   Hospitalization Follow-up     Subjective:   Nicole Austin is a 62 y.o. female, presents to clinic for form completion - she wants PCA assessment Multiple chronic conditions with recent worsening pain/mobility and decreased ability to care for herself She lives with her sister Currently using WC and rolling walker  PCA services application done today  She is scheduled for surgery and preop assessment at [place] on 8/2 and 8/13 Last labs did show continued hyponatremia  She states Dr. Charlann Austin said he only dose pain meds after surgery not before She was previously referred to pain management but never established She is using tylenol and ibuprofen now to manage pain and she states it doesn't help much and she is worried about her kidney and liver function  She continues to go to the ED due to severe pain I have again advised her to slowly wean off any narcotic pain meds, and I expect her pain to be worse - but ultimately she needs to follow the surgeons plan going into surgery - which at this time does not include any narcotics    Current Outpatient Medications:    acetaminophen (TYLENOL) 500 MG tablet, Take 2 tablets (1,000 mg total) by mouth every 8 (eight) hours as needed for moderate pain, mild pain, fever or headache., Disp: 90 tablet, Rfl: 0   albuterol (VENTOLIN HFA) 108 (90 Base) MCG/ACT inhaler, Inhale 2 puffs into the lungs every 4 (four) hours as needed for wheezing or shortness of breath., Disp: 18 g, Rfl: 2   ALPRAZolam (XANAX) 0.5 MG tablet, Take 1 tablet (0.5 mg total) by mouth 2 (two) times daily., Disp: 10 tablet, Rfl: 0   cyclobenzaprine (FLEXERIL) 5 MG tablet, Take 1 tablet (5 mg total) by mouth 3 (three) times daily as needed for muscle spasms., Disp: 20 tablet, Rfl: 0   diclofenac  (VOLTAREN) 50 MG EC tablet, Take 1 tablet (50 mg total) by mouth 2 (two) times daily., Disp: 30 tablet, Rfl: 0   divalproex (DEPAKOTE) 250 MG DR tablet, Take 1 tablet (250 mg total) by mouth 2 (two) times daily., Disp: 60 tablet, Rfl: 2   furosemide (LASIX) 20 MG tablet, Take 0.5-1 tablets (10-20 mg total) by mouth daily as needed for fluid or edema. Take in am with potassium PRN, Disp: 5 tablet, Rfl: 0   Lurasidone HCl (LATUDA) 60 MG TABS, Take 1 tablet (60 mg total) by mouth daily with breakfast., Disp: 30 tablet, Rfl: 0   nystatin (MYCOSTATIN/NYSTOP) powder, Apply 1 Application topically 3 (three) times daily., Disp: 15 g, Rfl: 0   oxyCODONE (ROXICODONE) 5 MG immediate release tablet, Take 1 tablet (5 mg total) by mouth every 8 (eight) hours as needed for up to 12 doses for severe pain or breakthrough pain., Disp: 12 tablet, Rfl: 0   polyethylene glycol (MIRALAX / GLYCOLAX) 17 g packet, Take 17 g by mouth daily., Disp: 30 each, Rfl: 0   potassium chloride SA (KLOR-CON M) 20 MEQ tablet, Take 1 tablet (20 mEq total) by mouth daily as needed (take potassium supplement when taking lasix for EDEMA)., Disp: 5 tablet, Rfl: 0   rOPINIRole (REQUIP XL) 4 MG 24 hr tablet, Take 4 mg by mouth at bedtime., Disp: , Rfl:  senna-docusate (SENOKOT-S) 8.6-50 MG tablet, Take 1 tablet by mouth 2 (two) times daily., Disp: 100 tablet, Rfl: 0   Tiotropium Bromide Monohydrate (SPIRIVA RESPIMAT) 2.5 MCG/ACT AERS, Inhale 2 puffs into the lungs daily., Disp: 1 each, Rfl: 0   escitalopram (LEXAPRO) 20 MG tablet, Take 1 tablet (20 mg total) by mouth daily., Disp: 30 tablet, Rfl: 2  Patient Active Problem List   Diagnosis Date Noted   Hyponatremia 07/09/2022   Iron deficiency anemia 07/07/2022   History of methicillin resistant staphylococcus aureus (MRSA) 05/03/2022   Avascular necrosis of bones of both hips (HCC) 05/03/2022   Chronic prescription benzodiazepine use 05/03/2022   Polypharmacy 04/09/2022   Sinus  bradycardia 04/09/2022   Depression with anxiety 04/09/2022   MRSA (methicillin resistant staph aureus) culture positive 05/04/2021   Prolonged QT interval 01/01/2021   Bipolar disorder with depression (HCC) 02/25/2017   ADD (attention deficit disorder) 02/25/2017   Marijuana use 02/13/2017   Neuropathy, peripheral 05/07/2016   Vitamin B12 deficiency 10/24/2015   Fibromyalgia 08/30/2015   OP (osteoporosis) 06/30/2015   Sleep apnea 06/30/2015   HLD (hyperlipidemia) 05/13/2015   Obesity, Class III, BMI 40-49.9 (morbid obesity) (HCC) 08/16/2014   Low back pain with sciatica 08/04/2014   Polysubstance abuse (HCC) 07/04/2014   Anxiety, generalized 11/24/2013   COPD (chronic obstructive pulmonary disease) (HCC) 11/18/2013   Osteoarthritis of knee, unspecified 07/15/2013    Past Surgical History:  Procedure Laterality Date   BACK SURGERY     lumbar   CATARACT EXTRACTION W/PHACO Right 01/01/2019   Procedure: CATARACT EXTRACTION PHACO AND INTRAOCULAR LENS PLACEMENT (IOC) right vision blue;  Surgeon: Elliot Cousin, MD;  Location: [facility] ORS;  Service: Ophthalmology;  Laterality: Right;  [place] 00:39.4 CDE 5.49 Fluid Pack lot # [id number]   CATARACT EXTRACTION W/PHACO Left 01/29/2019   Procedure: CATARACT EXTRACTION PHACO AND INTRAOCULAR LENS PLACEMENT (IOC) LEFT Vision Blue;  Surgeon: Elliot Cousin, MD;  Location: [facility] ORS;  Service: Ophthalmology;  Laterality: Left;  [place] 00:51.1 CDE 4.38 Fluid Pack Lot # [id number]   COLONOSCOPY WITH PROPOFOL N/A 08/07/2017   Procedure: COLONOSCOPY WITH PROPOFOL;  Surgeon: Pasty Spillers, MD;  Location: [facility] ENDOSCOPY;  Service: Endoscopy;  Laterality: N/A;   HAND SURGERY Left    fractured with pins   REPLACEMENT TOTAL KNEE Left 2016   SPINAL CORD STIMULATOR INSERTION  2014   SPINAL CORD STIMULATOR REMOVAL  2014   TUBAL LIGATION      Family History  Problem Relation Age of Onset   Hernia Mother    Heart disease Mother    OCD Mother    Diabetes  Mother    Parkinson's disease Father    Bipolar disorder Sister    Schizophrenia Sister    ADD / ADHD Sister    Alcohol abuse Brother    Bipolar disorder Sister    Paranoid behavior Sister    ADD / ADHD Sister    ADD / ADHD Son    Dementia Maternal Grandmother    Emphysema Maternal Grandfather    ADD / ADHD Son    ADD / ADHD Son    Depression Son     Social History   Tobacco Use   Smoking status: Former    Current packs/day: 0.00    Average packs/day: 0.5 packs/day for 36.0 years (18.0 ttl pk-yrs)    Types: Cigarettes    Start date: 03/13/1986    Quit date: 03/13/2022    Years since quitting: 0.4    Passive exposure:  Past   Smokeless tobacco: Never   Tobacco comments:    Pt using nicotine patches; 1/2 PPD as of 03/21/21  Vaping Use   Vaping status: Some Days   Substances: Nicotine, Flavoring  Substance Use Topics   Alcohol use: No    Alcohol/week: 0.0 standard drinks of alcohol   Drug use: Yes    Types: Marijuana    Comment: occasionally     Allergies  Allergen Reactions   Chantix [Varenicline] Nausea Only   Glycopyrrolate Rash    Oral irritation    Health Maintenance  Topic Date Due   MAMMOGRAM  Never done   Colonoscopy  08/08/2018   COVID-19 Vaccine (4 - 2023-24 season) 09/26/2022 (Originally 10/27/2021)   INFLUENZA VACCINE  09/27/2022   Medicare Annual Wellness (AWV)  03/22/2023   PAP SMEAR-Modifier  05/02/2023   DTaP/Tdap/Td (2 - Td or Tdap) 12/12/2025   Hepatitis C Screening  Completed   HIV Screening  Completed   Zoster Vaccines- Shingrix  Completed   HPV VACCINES  Aged Out    Chart Review Today: I personally reviewed active problem list, medication list, allergies, family history, social history, health maintenance, notes from last encounter, lab results, imaging with the patient/caregiver today.   Review of Systems  Constitutional: Negative.   HENT: Negative.    Eyes: Negative.   Respiratory: Negative.    Cardiovascular: Negative.    Gastrointestinal: Negative.   Endocrine: Negative.   Genitourinary: Negative.   Musculoskeletal: Negative.   Skin: Negative.   Allergic/Immunologic: Negative.   Neurological: Negative.   Hematological: Negative.   Psychiatric/Behavioral: Negative.    All other systems reviewed and are negative.    Objective:   Vitals:   09/10/22 1448  BP: (!) 152/88  Pulse: 67  Resp: 16  Temp: 98.1 F (36.7 C)  TempSrc: Oral  SpO2: 98%  Weight: 222 lb (100.7 kg)  Height: 5\' 1"  (1.549 m)    Body mass index is 41.95 kg/m.  Physical Exam Vitals and nursing note reviewed.  Constitutional:      General: She is not in acute distress.    Appearance: Normal appearance. She is well-developed. She is obese. She is not ill-appearing, toxic-appearing or diaphoretic.  HENT:     Head: Normocephalic and atraumatic.     Nose: Nose normal.  Eyes:     General:        Right eye: No discharge.        Left eye: No discharge.     Conjunctiva/sclera: Conjunctivae normal.  Neck:     Trachea: No tracheal deviation.  Cardiovascular:     Rate and Rhythm: Normal rate and regular rhythm.  Pulmonary:     Effort: Pulmonary effort is normal. No respiratory distress.     Breath sounds: No stridor.  Musculoskeletal:        General: Normal range of motion.  Skin:    General: Skin is warm and dry.     Findings: No rash.  Neurological:     Mental Status: She is alert. Mental status is at baseline.     Motor: No abnormal muscle tone.     Gait: Gait abnormal.  Psychiatric:        Attention and Perception: Attention normal.        Mood and Affect: Mood normal. Affect is tearful.        Speech: Speech normal.        Behavior: Behavior is cooperative.  Assessment & Plan:     ICD-10-CM   1. Unspecified abnormalities of gait and mobility  R26.9    limited by current bilateral hip pain - PCS application started    2. Avascular necrosis of bones of both hips (HCC)  M87.051    M87.052     pending surgery again on 8/13, preop assessment at the hospital on 8/2    3. Polypharmacy  Z79.899     4. Chronic obstructive pulmonary disease, unspecified COPD type (HCC)  J44.9     5. Bipolar disorder with depression (HCC)  F31.9    managed by dr. Janeece Riggers    6. Idiopathic peripheral neuropathy  G60.9     7. Osteoarthritis of knee, unspecified  M17.9     8. Fibromyalgia  M79.7    adding lyrica    9. Encounter for completion of form with patient  Z02.89    PCS application completed today - will be faxed to number on forms    10. Hyponatremia  E87.1    Dr. Janeece Riggers to adjust meds to try and help with hyponatremia, pt to limit free water, add some electrolytes, this did improve sodium before    11. Other chronic pain  G89.29    advised daily nsaid and tylenol, needs to be off narcotics for surgery, can try starting lyrica, start with 25 mg BID and increase to 50 mg BID    12. Decreased activities of daily living (ADL)  Z78.9    since Jan 2024 due to bilateral hip pain found to be avascular necrosis bilateral hips, pending surgery     BP elevated today - like from pain and pt being emotionally/visibly upset  Will recheck with her next appt, she is not on any BP meds VS earlier today and last night in ED BP was in normal range/at goal     Return in about 3 months (around 12/11/2022) for Routine follow-up.   Danelle Berry, PA-C 09/10/22 3:07 PM

## 2022-09-10 NOTE — ED Provider Notes (Signed)
[facility] Provider Note    Event Date/Time   First MD Initiated Contact with Patient 09/10/22 0215     (approximate)   History   Hip Pain   HPI Nicole Austin is a 62 y.o. female who reports chronic pain in both hips due to avascular necrosis and other bony issues.  She presents tonight for evaluation of worsening pain primarily in the left hip.  She says she is scheduled to have hip replacement surgery in about a month at [facility].  She said that she was active yesterday and doing more activities than usual out around the yard and around the house.  By tonight she was really hurting and it was making it difficult for her to walk.  She says she has not had any new fall or injury.  She states that she does not want to be placed in a facility such as for rehab or a skilled nursing facility, but the pain is bad tonight and she needs help until she can see her doctor tomorrow, as previously scheduled.     Physical Exam   Triage Vital Signs: ED Triage Vitals [09/09/22 2255]  Encounter Vitals Group     BP 110/63     Systolic BP Percentile      Diastolic BP Percentile      Pulse Rate 60     Resp 16     Temp 98.2 F (36.8 C)     Temp Source Oral     SpO2 96 %     Weight 100.7 kg (222 lb)     Height 1.549 m (5\' 1" )     Head Circumference      Peak Flow      Pain Score 6     Pain Loc      Pain Education      Exclude from Growth Chart     Most recent vital signs: Vitals:   09/09/22 2255 09/10/22 0424  BP: 110/63 108/65  Pulse: 60 (!) 58  Resp: 16 18  Temp: 98.2 F (36.8 C)   SpO2: 96% 97%    General: Patient resting comfortably and sleeping before I saw her.  No obvious distress. CV:  Good peripheral perfusion. Resp:  Normal effort. Speaking easily and comfortably, no accessory muscle usage nor intercostal retractions.   Abd:  No distention.  MSK:  Mild pain/tenderness with active and passive range of motion of both legs, most notably  at the left hip.  No palpable deformities nor instability in the pelvis.   ED Results / Procedures / Treatments   Labs (all labs ordered are listed, but only abnormal results are displayed) Labs Reviewed - No data to display   PROCEDURES:  Critical Care performed: No  Procedures    IMPRESSION / MDM / ASSESSMENT AND PLAN / ED COURSE  I reviewed the triage vital signs and the nursing notes.                              Differential diagnosis includes, but is not limited to, acute on chronic pain, worsening avascular necrosis, fracture, dislocation.  Patient's presentation is most consistent with exacerbation of chronic illness.  Interventions/Medications given:  Medications  oxyCODONE-acetaminophen (PERCOCET/ROXICET) 5-325 MG per tablet 2 tablet (2 tablets Oral Given 09/10/22 0341)    (Note:  hospital course my include additional interventions and/or labs/studies not listed above.)   I verified in the  medical record that the patient Has been seen previously with similar issues and verified in her imaging that she has the history described of avascular necrosis.  The patient reports being active around her house yesterday, and I offered her to see consultation for facility placement, but she adamantly declined.  She says she just needed something for the pain right now.  I checked the [place] controlled substance database that the patient has had multiple prescriptions in the past.  Under the circumstances, I ordered 2 Percocet as listed above but explained that we would not be able to write any prescriptions for her she said that is fine.  I will check on her again after the medication and I anticipate discharge for outpatient follow-up.     Clinical Course as of 09/10/22 0737  Mon Sep 10, 2022  1610 Patient has been sleeping soundly and comfortably.  She is appropriate for discharge for follow-up later today as previously scheduled with her doctor. [CF]    Clinical  Course User Index [CF] Loleta Rose, MD     FINAL CLINICAL IMPRESSION(S) / ED DIAGNOSES   Final diagnoses:  Chronic pain of both hips     Rx / DC Orders   ED Discharge Orders     None        Note:  This document was prepared using Dragon voice recognition software and may include unintentional dictation errors.   Loleta Rose, MD 09/10/22 [phone]

## 2022-09-10 NOTE — Discharge Instructions (Signed)
Please follow-up with your doctor today as scheduled to discuss the pain in your hips and appropriate pain management for the next month until you have your surgery.

## 2022-09-11 NOTE — Progress Notes (Signed)
  Care Coordination  Outreach Note  09/11/2022 Name: Nicole Austin MRN: [id number] DOB: 15-Jan-1961   Care Coordination Outreach Attempts: A second unsuccessful outreach was attempted today to offer the patient with information about available care coordination services.  Follow Up Plan:  Additional outreach attempts will be made to offer the patient care coordination information and services.   Encounter Outcome:  No Answer  Burman Nieves, CCMA Care Coordination Care Guide Direct Dial: [phone]

## 2022-09-13 DIAGNOSIS — M87851 Other osteonecrosis, right femur: Secondary | ICD-10-CM | POA: Diagnosis not present

## 2022-09-13 DIAGNOSIS — T796XXD Traumatic ischemia of muscle, subsequent encounter: Secondary | ICD-10-CM | POA: Diagnosis not present

## 2022-09-13 DIAGNOSIS — E785 Hyperlipidemia, unspecified: Secondary | ICD-10-CM | POA: Diagnosis not present

## 2022-09-13 DIAGNOSIS — R7303 Prediabetes: Secondary | ICD-10-CM | POA: Diagnosis not present

## 2022-09-13 DIAGNOSIS — E871 Hypo-osmolality and hyponatremia: Secondary | ICD-10-CM | POA: Diagnosis not present

## 2022-09-13 DIAGNOSIS — M109 Gout, unspecified: Secondary | ICD-10-CM | POA: Diagnosis not present

## 2022-09-13 DIAGNOSIS — M797 Fibromyalgia: Secondary | ICD-10-CM | POA: Diagnosis not present

## 2022-09-13 DIAGNOSIS — G8929 Other chronic pain: Secondary | ICD-10-CM | POA: Diagnosis not present

## 2022-09-13 DIAGNOSIS — M87059 Idiopathic aseptic necrosis of unspecified femur: Secondary | ICD-10-CM | POA: Diagnosis not present

## 2022-09-13 DIAGNOSIS — G20A1 Parkinson's disease without dyskinesia, without mention of fluctuations: Secondary | ICD-10-CM | POA: Diagnosis not present

## 2022-09-13 DIAGNOSIS — I252 Old myocardial infarction: Secondary | ICD-10-CM | POA: Diagnosis not present

## 2022-09-13 DIAGNOSIS — M1612 Unilateral primary osteoarthritis, left hip: Secondary | ICD-10-CM | POA: Diagnosis not present

## 2022-09-13 DIAGNOSIS — I2510 Atherosclerotic heart disease of native coronary artery without angina pectoris: Secondary | ICD-10-CM | POA: Diagnosis not present

## 2022-09-13 DIAGNOSIS — G2581 Restless legs syndrome: Secondary | ICD-10-CM | POA: Diagnosis not present

## 2022-09-13 DIAGNOSIS — K219 Gastro-esophageal reflux disease without esophagitis: Secondary | ICD-10-CM | POA: Diagnosis not present

## 2022-09-13 DIAGNOSIS — M87852 Other osteonecrosis, left femur: Secondary | ICD-10-CM | POA: Diagnosis not present

## 2022-09-13 DIAGNOSIS — B372 Candidiasis of skin and nail: Secondary | ICD-10-CM | POA: Diagnosis not present

## 2022-09-13 DIAGNOSIS — I69331 Monoplegia of upper limb following cerebral infarction affecting right dominant side: Secondary | ICD-10-CM | POA: Diagnosis not present

## 2022-09-13 DIAGNOSIS — G4730 Sleep apnea, unspecified: Secondary | ICD-10-CM | POA: Diagnosis not present

## 2022-09-13 DIAGNOSIS — M810 Age-related osteoporosis without current pathological fracture: Secondary | ICD-10-CM | POA: Diagnosis not present

## 2022-09-14 ENCOUNTER — Telehealth: Payer: Self-pay

## 2022-09-14 NOTE — Telephone Encounter (Signed)
Transition Care Management Unsuccessful Follow-up Telephone Call  Date of discharge and from where:  09/10/2022 [facility]  Attempts:  1st Attempt  Reason for unsuccessful TCM follow-up call:  No answer/busy  Nashayla Telleria [facility]  [facility] Community Resource Care Guide   [email or web address]  ?? [phone]   Website: [email or web address]  [email or web address]

## 2022-09-17 ENCOUNTER — Other Ambulatory Visit: Payer: Self-pay | Admitting: Internal Medicine

## 2022-09-17 ENCOUNTER — Other Ambulatory Visit: Payer: Self-pay

## 2022-09-17 ENCOUNTER — Emergency Department
Admission: EM | Admit: 2022-09-17 | Discharge: 2022-09-17 | Disposition: A | Discharge status: Home / Self Care | Payer: 59 | Source: Home / Self Care | Attending: Emergency Medicine | Admitting: Emergency Medicine

## 2022-09-17 DIAGNOSIS — J449 Chronic obstructive pulmonary disease, unspecified: Secondary | ICD-10-CM | POA: Diagnosis not present

## 2022-09-17 DIAGNOSIS — Z743 Need for continuous supervision: Secondary | ICD-10-CM | POA: Diagnosis not present

## 2022-09-17 DIAGNOSIS — M25551 Pain in right hip: Secondary | ICD-10-CM | POA: Insufficient documentation

## 2022-09-17 DIAGNOSIS — S79912A Unspecified injury of left hip, initial encounter: Secondary | ICD-10-CM | POA: Diagnosis not present

## 2022-09-17 MED ORDER — OXYCODONE HCL 5 MG PO TABS: 5.0000 mg | ORAL_TABLET | Freq: Three times a day (TID) | ORAL | 0 refills | Status: DC | PRN

## 2022-09-17 MED ORDER — MORPHINE SULFATE (PF) 4 MG/ML IV SOLN
5.0000 mg | Freq: Once | INTRAVENOUS | Status: AC
  Administered 2022-09-17: 5 mg via INTRAMUSCULAR
  Filled 2022-09-17: qty 2

## 2022-09-17 MED ORDER — OXYCODONE HCL 5 MG PO TABS
10.0000 mg | ORAL_TABLET | Freq: Once | ORAL | Status: AC
  Administered 2022-09-17: 10 mg via ORAL
  Filled 2022-09-17: qty 2

## 2022-09-17 NOTE — ED Triage Notes (Signed)
Ems report: pt with chronic left hip pain, here for same. Pt has taken tylenol for pain.

## 2022-09-17 NOTE — Discharge Instructions (Addendum)
Please follow-up with your orthopedic surgeon as you have scheduled for your hip surgery.  You may take the pain medication as needed for pain, though remember that this is highly addictive and should only be used for breakthrough pain. Also remember that you cannot drive, operate heavy machinery, or perform any tasks require concentration while taking this medication.  It was a pleasure caring for you today.

## 2022-09-17 NOTE — ED Provider Notes (Signed)
[facility] Provider Note    Event Date/Time   First MD Initiated Contact with Patient 09/17/22 1010     (approximate)   History   Hip Pain   HPI  Nicole Austin is a 62 y.o. female with a past medical history of chronic pain in both hips due to avascular necrosis who presents today for evaluation of hip pain.  She is scheduled to have surgery next month.  She reports that she was at a family member's house over the weekend and went upstairs and this caused her pain to worsen.  She reports that she did not have any falls or injuries.  No new pain or symptoms.  No fevers or chills.  No paresthesias or weakness.  Patient Active Problem List   Diagnosis Date Noted   Hyponatremia 07/09/2022   Iron deficiency anemia 07/07/2022   History of methicillin resistant staphylococcus aureus (MRSA) 05/03/2022   Avascular necrosis of bones of both hips (HCC) 05/03/2022   Chronic prescription benzodiazepine use 05/03/2022   Polypharmacy 04/09/2022   Sinus bradycardia 04/09/2022   Depression with anxiety 04/09/2022   MRSA (methicillin resistant staph aureus) culture positive 05/04/2021   Prolonged QT interval 01/01/2021   Bipolar disorder with depression (HCC) 02/25/2017   ADD (attention deficit disorder) 02/25/2017   Marijuana use 02/13/2017   Neuropathy, peripheral 05/07/2016   Vitamin B12 deficiency 10/24/2015   Fibromyalgia 08/30/2015   OP (osteoporosis) 06/30/2015   Sleep apnea 06/30/2015   HLD (hyperlipidemia) 05/13/2015   Obesity, Class III, BMI 40-49.9 (morbid obesity) (HCC) 08/16/2014   Low back pain with sciatica 08/04/2014   Polysubstance abuse (HCC) 07/04/2014   Anxiety, generalized 11/24/2013   COPD (chronic obstructive pulmonary disease) (HCC) 11/18/2013   Osteoarthritis of knee, unspecified 07/15/2013          Physical Exam   Triage Vital Signs: ED Triage Vitals  Encounter Vitals Group     BP 09/17/22 0933 (!) 185/96     Systolic  BP Percentile --      Diastolic BP Percentile --      Pulse Rate 09/17/22 0931 70     Resp 09/17/22 0931 20     Temp 09/17/22 0931 98.6 F (37 C)     Temp src --      SpO2 09/17/22 0931 100 %     Weight 09/17/22 0932 220 lb 7.4 oz (100 kg)     Height 09/17/22 0932 5\' 1"  (1.549 m)     Head Circumference --      Peak Flow --      Pain Score 09/17/22 0932 10     Pain Loc --      Pain Education --      Exclude from Growth Chart --     Most recent vital signs: Vitals:   09/17/22 0933 09/17/22 1226  BP: (!) 185/96 (!) 170/80  Pulse:  70  Resp:  18  Temp:    SpO2:  99%    Physical Exam Vitals and nursing note reviewed.  Constitutional:      General: Awake and alert. No acute distress.    Appearance: Normal appearance. The patient is obese.  HENT:     Head: Normocephalic and atraumatic.     Mouth: Mucous membranes are moist.  Eyes:     General: PERRL. Normal EOMs        Right eye: No discharge.        Left eye: No discharge.  Conjunctiva/sclera: Conjunctivae normal.  Cardiovascular:     Rate and Rhythm: Normal rate and regular rhythm.  Pulmonary:     Effort: Pulmonary effort is normal. No respiratory distress.     Breath sounds: Normal breath sounds.  Abdominal:     Abdomen is soft. There is no abdominal tenderness. No rebound or guarding. No distention. Musculoskeletal:        General: No swelling. Normal range of motion.     Cervical back: Normal range of motion and neck supple.  No warmth, erythema, or swelling noted.  No hematoma or ecchymosis noted.  Able to actively range bilateral hips, no pain with doing so.  Normal distal pulses.  Normal capillary refill.  Sensation intact light touch bilaterally distally. Skin:    General: Skin is warm and dry.     Capillary Refill: Capillary refill takes less than 2 seconds.     Findings: No rash.  Neurological:     Mental Status: The patient is awake and alert.      ED Results / Procedures / Treatments    Labs (all labs ordered are listed, but only abnormal results are displayed) Labs Reviewed - No data to display   EKG     RADIOLOGY I reviewed previous imaging and agree with the radiologist's findings.    PROCEDURES:  Critical Care performed:   Procedures   MEDICATIONS ORDERED IN ED: Medications  morphine (PF) 4 MG/ML injection 5 mg (5 mg Intramuscular Given 09/17/22 1033)  oxyCODONE (Oxy IR/ROXICODONE) immediate release tablet 10 mg (10 mg Oral Given 09/17/22 1119)     IMPRESSION / MDM / ASSESSMENT AND PLAN / ED COURSE  I reviewed the triage vital signs and the nursing notes.   Differential diagnosis includes, but is not limited to, chronic pain, avascular necrosis, osteoarthritis.  I reviewed the patient's chart.  Patient was seen in the emergency department most recently on 09/04/2022 as well as 09/10/2022 for the same complaint.  Her most recent x-ray was on 09/04/2022 which reveals known left greater than right superior femoral head avascular necrosis with progressive left femoral head cortical collapse involving 25% of the left femoral head with femoral acetabular bone-on-bone contact.  She is scheduled for total hip arthroplasty on 10/09/2022 with Dr. Durene Romans.  Patient is awake and alert, hemodynamically stable and afebrile.  She is neurovascularly intact.  She reports that her pain is exactly the same as her previous pain.  No need for repeat x-ray today given lack of any trauma and no different pain.  She was treated symptomatically with improvement of her symptoms.  She was given a short course of her pain medicine to take at home.  Patient reports that she feels significantly improved and ready for discharge.  She does not wish to be placed.  We discussed return precautions and outpatient follow-up.  She was advised that oxycodone is highly addictive and should only be used for breakthrough pain.  She is also advised that she cannot drive, operate heavy machinery, or  perform any tasks require concentration while taking this medication.  Patient understands and agrees with plan.  She was discharged in stable condition.  Patient's presentation is most consistent with severe exacerbation of chronic illness.   Clinical Course as of 09/17/22 1236  Mon Sep 17, 2022  1107 Patient reports that she still has pain.  She reports that oxycodone 10 mg is usually what works for her. [JP]  1154 Patient reports that she feels improved [JP]  Clinical Course User Index [JP] Hisayo Delossantos, Herb Grays, PA-C     FINAL CLINICAL IMPRESSION(S) / ED DIAGNOSES   Final diagnoses:  Pain of right hip     Rx / DC Orders   ED Discharge Orders          Ordered    oxyCODONE (ROXICODONE) 5 MG immediate release tablet  Every 8 hours PRN        09/17/22 1155             Note:  This document was prepared using Dragon voice recognition software and may include unintentional dictation errors.   Keturah Shavers 09/17/22 1236    Pilar Jarvis, MD 09/17/22 [phone]

## 2022-09-17 NOTE — ED Triage Notes (Signed)
Pt to ED EMS from home for chronic left hip pain. Scheduled surgery 8/13.

## 2022-09-17 NOTE — ED Notes (Signed)
See triage note Presents with pain to left hip  States this is a chronic issue  and is scheduled to have surgery in August  States the pain is unbearable

## 2022-09-18 ENCOUNTER — Telehealth: Payer: Self-pay | Admitting: Family Medicine

## 2022-09-18 NOTE — Telephone Encounter (Signed)
Pt called back and was asking about her [employer] forms sefvices I advised we had already faxed them and she would have to contact them to see where is her process I provided [employer] number and she was going to call them

## 2022-09-18 NOTE — Telephone Encounter (Signed)
Called pt no answer unable to leave vm due to being full.

## 2022-09-18 NOTE — Progress Notes (Signed)
  Care Coordination   Note   09/18/2022 Name: Nicole Austin MRN: [id number] DOB: Mar 07, 1960  Nicole Austin is a 62 y.o. year old female who sees Danelle Berry, [place] for primary care. I reached out to Vena Rua Nordby by phone today to offer care coordination services.  [name] was given information about Care Coordination services today including:   The Care Coordination services include support from the care team which includes your Nurse Coordinator, Clinical Social Worker, or Pharmacist.  The Care Coordination team is here to help remove barriers to the health concerns and goals most important to you. Care Coordination services are voluntary, and the patient may decline or stop services at any time by request to their care team member.   Care Coordination Consent Status: Patient agreed to services and verbal consent obtained.   Follow up plan:  Telephone appointment with care coordination team member scheduled for:  09/24/2022  Encounter Outcome:  Pt. Scheduled  Burman Nieves, CCMA Care Coordination Care Guide Direct Dial: [phone]

## 2022-09-18 NOTE — Telephone Encounter (Signed)
Requested medications are due for refill today.  Unsure  Requested medications are on the active medications list.  no  Last refill. 04/25/2021 #90 0 rf  Future visit scheduled.   no  Notes to clinic.  Please review for refill.    Requested Prescriptions  Pending Prescriptions Disp Refills   atorvastatin (LIPITOR) 40 MG tablet [Pharmacy Med Name: ATORVASTATIN CALCIUM 40MG  TABLET] 90 tablet 3    Sig: TAKE ONE TABLET BY MOUTH EVERY NIGHT AT BEDTIME     Cardiovascular:  Antilipid - Statins Failed - 09/17/2022  3:02 PM      Failed - Lipid Panel in normal range within the last 12 months    Cholesterol  Date Value Ref Range Status  12/07/2019 117 <200 mg/dL Final  76/16/0737 106 0 - 200 mg/dL Final   Ldl Cholesterol, Calc  Date Value Ref Range Status  10/21/2013 98 0 - 100 mg/dL Final   LDL Cholesterol (Calc)  Date Value Ref Range Status  12/07/2019 58 mg/dL (calc) Final    Comment:    Reference range: <100 . Desirable range <100 mg/dL for primary prevention;   <70 mg/dL for patients with CHD or diabetic patients  with > or = 2 CHD risk factors. Marland Kitchen LDL-C is now calculated using the Martin-Hopkins  calculation, which is a validated novel method providing  better accuracy than the Friedewald equation in the  estimation of LDL-C.  Horald Pollen et al. Lenox Ahr. 2694;854(62): 2061-2068  (http://education.[facility].com/faq/FAQ164)    HDL Cholesterol  Date Value Ref Range Status  10/21/2013 35 (L) 40 - 60 mg/dL Final   HDL  Date Value Ref Range Status  12/07/2019 31 (L) > OR = 50 mg/dL Final   Triglycerides  Date Value Ref Range Status  12/07/2019 216 (H) <150 mg/dL Final    Comment:    . If a non-fasting specimen was collected, consider repeat triglyceride testing on a fasting specimen if clinically indicated.  Perry Mount et al. J. of Clin. Lipidol. 2015;9:129-169. .   10/21/2013 182 0 - 200 mg/dL Final         Passed - Patient is not pregnant      Passed -  Valid encounter within last 12 months    Recent Outpatient Visits           1 week ago Unspecified abnormalities of gait and mobility   [facility] Danelle Berry, PA-C   1 month ago Encounter for examination following treatment at hospital   [facility] Danelle Berry, PA-C   3 months ago Intertrigo   [facility] Margarita Mail, DO   3 months ago Intertrigo   [facility] Danelle Berry, PA-C   4 months ago Encounter for preoperative assessment   [facility] Danelle Berry, [place]

## 2022-09-18 NOTE — Telephone Encounter (Signed)
Copied from CRM [id number]. Topic: Referral - Request for Referral >> Sep 18, 2022  8:26 AM Phill Myron wrote: Please call [name], she would like to discuss how she can receive home health care services. Thank you

## 2022-09-21 ENCOUNTER — Ambulatory Visit: Payer: Self-pay

## 2022-09-21 DIAGNOSIS — M79676 Pain in unspecified toe(s): Secondary | ICD-10-CM

## 2022-09-21 NOTE — Telephone Encounter (Signed)
  Chief Complaint: Painful overgrown toenails Symptoms: above Frequency: ongoing Pertinent Negatives: Patient denies ability to trim them herself Disposition: [] ED /[] Urgent Care (no appt availability in office) / [x] Appointment(In office/virtual)/ []  [place] [facility]/ [] Home Care/ [] Refused Recommended Disposition /[] [facility]/ []  Follow-up with PCP Additional Notes: Pt states that her toenails are very overgrown and painful. She in unable to trim them herself and has no one to help her with this.  Unsure if PCP would help with this or if pt needs a referral to a podiatrist. Made app for Tuesday with pcp. Please advise.  Reason for Disposition  [1] MODERATE pain (e.g., interferes with normal activities, limping) AND [2] present > 3 days  Answer Assessment - Initial Assessment Questions 1. ONSET: "When did the pain start?"      Weeks 2. LOCATION: "Where is the pain located?"   (e.g., around nail, entire toe, at foot joint)      Both feet 3. PAIN: "How bad is the pain?"    (Scale 1-10; or mild, moderate, severe)   -  MILD (1-3): doesn't interfere with normal activities    -  MODERATE (4-7): interferes with normal activities (e.g., work or school) or awakens from sleep, limping    -  SEVERE (8-10): excruciating pain, unable to do any normal activities, unable to walk     moderate 4. APPEARANCE: "What does the toe look like?" (e.g., redness, swelling, bruising, pallor)     Overgrown 5. CAUSE: "What do you think is causing the toe pain?"     Over lapping toenails  Protocols used: Toe Pain-A-AH

## 2022-09-21 NOTE — Progress Notes (Signed)
Sent message, via epic in basket, requesting orders in epic from surgeon.  

## 2022-09-24 ENCOUNTER — Ambulatory Visit: Payer: Self-pay | Admitting: *Deleted

## 2022-09-24 DIAGNOSIS — M810 Age-related osteoporosis without current pathological fracture: Secondary | ICD-10-CM | POA: Diagnosis not present

## 2022-09-24 DIAGNOSIS — G20A1 Parkinson's disease without dyskinesia, without mention of fluctuations: Secondary | ICD-10-CM | POA: Diagnosis not present

## 2022-09-24 DIAGNOSIS — G4730 Sleep apnea, unspecified: Secondary | ICD-10-CM | POA: Diagnosis not present

## 2022-09-24 DIAGNOSIS — I252 Old myocardial infarction: Secondary | ICD-10-CM | POA: Diagnosis not present

## 2022-09-24 DIAGNOSIS — M797 Fibromyalgia: Secondary | ICD-10-CM | POA: Diagnosis not present

## 2022-09-24 DIAGNOSIS — G2581 Restless legs syndrome: Secondary | ICD-10-CM | POA: Diagnosis not present

## 2022-09-24 DIAGNOSIS — R7303 Prediabetes: Secondary | ICD-10-CM | POA: Diagnosis not present

## 2022-09-24 DIAGNOSIS — M1612 Unilateral primary osteoarthritis, left hip: Secondary | ICD-10-CM | POA: Diagnosis not present

## 2022-09-24 DIAGNOSIS — B372 Candidiasis of skin and nail: Secondary | ICD-10-CM | POA: Diagnosis not present

## 2022-09-24 DIAGNOSIS — G8929 Other chronic pain: Secondary | ICD-10-CM | POA: Diagnosis not present

## 2022-09-24 DIAGNOSIS — E785 Hyperlipidemia, unspecified: Secondary | ICD-10-CM | POA: Diagnosis not present

## 2022-09-24 DIAGNOSIS — M109 Gout, unspecified: Secondary | ICD-10-CM | POA: Diagnosis not present

## 2022-09-24 DIAGNOSIS — T796XXD Traumatic ischemia of muscle, subsequent encounter: Secondary | ICD-10-CM | POA: Diagnosis not present

## 2022-09-24 DIAGNOSIS — M87852 Other osteonecrosis, left femur: Secondary | ICD-10-CM | POA: Diagnosis not present

## 2022-09-24 DIAGNOSIS — K219 Gastro-esophageal reflux disease without esophagitis: Secondary | ICD-10-CM | POA: Diagnosis not present

## 2022-09-24 DIAGNOSIS — I2510 Atherosclerotic heart disease of native coronary artery without angina pectoris: Secondary | ICD-10-CM | POA: Diagnosis not present

## 2022-09-24 DIAGNOSIS — E871 Hypo-osmolality and hyponatremia: Secondary | ICD-10-CM | POA: Diagnosis not present

## 2022-09-24 DIAGNOSIS — I69331 Monoplegia of upper limb following cerebral infarction affecting right dominant side: Secondary | ICD-10-CM | POA: Diagnosis not present

## 2022-09-24 DIAGNOSIS — M87851 Other osteonecrosis, right femur: Secondary | ICD-10-CM | POA: Diagnosis not present

## 2022-09-24 NOTE — Patient Outreach (Signed)
  Care Coordination   Initial Visit Note   09/24/2022 Name: Nicole Austin MRN: [id number] DOB: 1960-10-19  Nicole Austin is a 62 y.o. year old female who sees Nicole Austin, [place] for primary care. I spoke with  Nicole Austin by phone today.  What matters to the patients health and wellness today?  Patient report not a good time to talk, PT is currently in the home.  State she will call back.  Update @ 1525:  Voice message received back from patient requesting call back.  Call placed again, unsuccessful, unable to leave message as mailbox is full.     SDOH assessments and interventions completed:  No     Care Coordination Interventions:  No, not indicated   Follow up plan:  Will have Care Guide reschedule    Encounter Outcome:  Pt. Request to Call Back   Nicole Durie, RN, MSN, [facility] Care Management Care Management Coordinator [phone]

## 2022-09-25 ENCOUNTER — Ambulatory Visit: Payer: 59 | Admitting: Family Medicine

## 2022-09-27 ENCOUNTER — Telehealth: Payer: Self-pay | Admitting: *Deleted

## 2022-09-27 DIAGNOSIS — M87059 Idiopathic aseptic necrosis of unspecified femur: Secondary | ICD-10-CM | POA: Diagnosis not present

## 2022-09-27 NOTE — Progress Notes (Signed)
  Care Coordination Note  09/27/2022 Name: [name] MRN: [id number] DOB: 04/10/1960  Nicole Austin is a 62 y.o. year old female who is a primary care patient of Sander Radon and is actively engaged with the care management team. I reached out to Vena Rua Glad by phone today to assist with re-scheduling an initial visit with the RN Case Manager  Follow up plan: Unsuccessful telephone outreach attempt made. A HIPAA compliant phone message was left for the patient providing contact information and requesting a return call.   Burman Nieves, CCMA Care Coordination Care Guide Direct Dial: [phone]

## 2022-09-27 NOTE — Patient Instructions (Signed)
SURGICAL WAITING ROOM VISITATION Patients having surgery or a procedure may have no more than 2 support people in the waiting area - these visitors may rotate in the visitor waiting room.   Due to an increase in RSV and influenza rates and associated hospitalizations, children ages 9 and under may not visit patients in [facility] hospitals. If the patient needs to stay at the hospital during part of their recovery, the visitor guidelines for inpatient rooms apply.  PRE-OP VISITATION  Pre-op nurse will coordinate an appropriate time for 1 support person to accompany the patient in pre-op.  This support person may not rotate.  This visitor will be contacted when the time is appropriate for the visitor to come back in the pre-op area.  Please refer to the [facility] website for the visitor guidelines for Inpatients (after your surgery is over and you are in a regular room).  You are not required to quarantine at this time prior to your surgery. However, you must do this: Hand Hygiene often Do NOT share personal items Notify your provider if you are in close contact with someone who has COVID or you develop fever 100.4 or greater, new onset of sneezing, cough, sore throat, shortness of breath or body aches.  If you test positive for Covid or have been in contact with anyone that has tested positive in the last 10 days please notify you surgeon.    Your procedure is scheduled on:  Tuesday  October 09, 2022  Report to [facility] Main Entrance: [place] entrance where the [employer] is available.   Report to admitting at: 10:15    AM  Call this number if you have any questions or problems the morning of surgery [phone]  Do not eat food after Midnight the night prior to your surgery/procedure.  After Midnight you may have the following liquids until   09:45  AM  DAY OF SURGERY  Clear Liquid Diet Water Black Coffee (sugar ok, NO MILK/CREAM OR CREAMERS)  Tea (sugar ok, NO  MILK/CREAM OR CREAMERS) regular and decaf                             Plain Jell-O  with no fruit (NO RED)                                           Fruit ices (not with fruit pulp, NO RED)                                     Popsicles (NO RED)                                                                  Juice: NO CITRUS JUICES: only apple, WHITE grape, WHITE cranberry Sports drinks like Gatorade or Powerade (NO RED)                    The day of surgery:  Drink ONE (1) Pre-Surgery G2 at  09:45  AM the morning of surgery. Drink in one sitting. Do not sip.  This drink was given to you during your hospital pre-op appointment visit. Nothing else to drink after completing the Pre-SurgeryG2 : No candy, chewing gum or throat lozenges.    FOLLOW ANY ADDITIONAL PRE OP INSTRUCTIONS YOU RECEIVED FROM YOUR SURGEON'S OFFICE!!!   Oral Hygiene is also important to reduce your risk of infection.        Remember - BRUSH YOUR TEETH THE MORNING OF SURGERY WITH YOUR REGULAR TOOTHPASTE  Do NOT smoke after Midnight the night before surgery.  STOP TAKING all Vitamins, Herbs and supplements 1 week before your surgery.   Take ONLY these medicines the morning of surgery with A SIP OF WATER: Lurasidone (Latuda), escitalopram (Lexapro), pregabalin (Lyrica), divalproex (Depakote), alprazolam (Xanax) and Tramadol if needed for pain.    If You have been diagnosed with Sleep Apnea - Bring CPAP mask and tubing day of surgery. We will provide you with a CPAP machine on the day of your surgery.                   You may not have any metal on your body including hair pins, jewelry, and body piercing  Do not wear make-up, lotions, powders, perfumes  or deodorant  Do not wear nail polish including gel and S&S, artificial / acrylic nails, or any other type of covering on natural nails including finger and toenails. If you have artificial nails, gel coating, etc., that needs to be removed by a nail salon, Please  have this removed prior to surgery. Not doing so may mean that your surgery could be cancelled or delayed if the Surgeon or anesthesia staff feels like they are unable to monitor you safely.   Do not shave 48 hours prior to surgery to avoid nicks in your skin which may contribute to postoperative infections.   Contacts, Hearing Aids, dentures or bridgework may not be worn into surgery. DENTURES WILL BE REMOVED PRIOR TO SURGERY PLEASE DO NOT APPLY "Poly grip" OR ADHESIVES!!!  You may bring a small overnight bag with you on the day of surgery, only pack items that are not valuable.  IS NOT RESPONSIBLE   FOR VALUABLES THAT ARE LOST OR STOLEN.    Do not bring your home medications to the hospital. The Pharmacy will dispense medications listed on your medication list to you during your admission in the Hospital.   Please read over the following fact sheets you were given: IF YOU HAVE QUESTIONS ABOUT YOUR PRE-OP INSTRUCTIONS, PLEASE CALL [phone].         Pre-operative 5 CHG Bath Instructions   You can play a key role in reducing the risk of infection after surgery. Your skin needs to be as free of germs as possible. You can reduce the number of germs on your skin by washing with CHG (chlorhexidine gluconate) soap before surgery. CHG is an antiseptic soap that kills germs and continues to kill germs even after washing.   DO NOT use if you have an allergy to chlorhexidine/CHG or antibacterial soaps. If your skin becomes reddened or irritated, stop using the CHG and notify one of our RNs at [phone]  Please shower with the CHG soap starting 4 days before surgery using the following schedule: START SHOWERS ON FRIDAY  October 05, 2022  Please keep in mind the following:  DO NOT shave, including  legs and underarms, starting the day of your first shower.   You may shave your face at any point before/day of surgery.   Place clean sheets on your bed the day you start using CHG soap. Use a clean washcloth (not used since being washed) for each shower. DO NOT sleep with pets once you start using the CHG.   CHG Shower Instructions:  If you choose to wash your hair and private area, wash first with your normal shampoo/soap.  After you use shampoo/soap, rinse your hair and body thoroughly to remove shampoo/soap residue.  Turn the water OFF and apply about 3 tablespoons (45 ml) of CHG soap to a CLEAN washcloth.  Apply CHG soap ONLY FROM YOUR NECK DOWN TO YOUR TOES (washing for 3-5 minutes)  DO NOT use CHG soap on face, private areas, open wounds, or sores.  Pay special attention to the area where your surgery is being performed.  If you are having back surgery, having someone wash your back for you may be helpful.  Wait 2 minutes after CHG soap is applied, then you may rinse off the CHG soap.  Pat dry with a clean towel  Put on clean clothes/pajamas   If you choose to wear lotion, please use ONLY the CHG-compatible lotions on the back of this paper.     Additional instructions for the day of surgery: DO NOT APPLY any lotions, deodorants, cologne, or perfumes.   Put on clean/comfortable clothes.  Brush your teeth.  Ask your nurse before applying any prescription medications to the skin.      CHG Compatible Lotions   Aveeno Moisturizing lotion  Cetaphil Moisturizing Cream  Cetaphil Moisturizing Lotion  Clairol Herbal Essence Moisturizing Lotion, Dry Skin  Clairol Herbal Essence Moisturizing Lotion, Extra Dry Skin  Clairol Herbal Essence Moisturizing Lotion, Normal Skin  Curel Age Defying Therapeutic Moisturizing Lotion with Alpha Hydroxy  Curel Extreme Care Body Lotion  Curel Soothing Hands Moisturizing Hand Lotion  Curel Therapeutic Moisturizing Cream, Fragrance-Free  Curel  Therapeutic Moisturizing Lotion, Fragrance-Free  Curel Therapeutic Moisturizing Lotion, Original Formula  Eucerin Daily Replenishing Lotion  Eucerin Dry Skin Therapy Plus Alpha Hydroxy Crme  Eucerin Dry Skin Therapy Plus Alpha Hydroxy Lotion  Eucerin Original Crme  Eucerin Original Lotion  Eucerin Plus Crme Eucerin Plus Lotion  Eucerin TriLipid Replenishing Lotion  Keri Anti-Bacterial Hand Lotion  Keri Deep Conditioning Original Lotion Dry Skin Formula Softly Scented  Keri Deep Conditioning Original Lotion, Fragrance Free Sensitive Skin Formula  Keri Lotion Fast Absorbing Fragrance Free Sensitive Skin Formula  Keri Lotion Fast Absorbing Softly Scented Dry Skin Formula  Keri Original Lotion  Keri Skin Renewal Lotion Keri Silky Smooth Lotion  Keri Silky Smooth Sensitive Skin Lotion  Nivea Body Creamy Conditioning Oil  Nivea Body Extra Enriched Lotion  Nivea Body Original Lotion  Nivea Body Sheer Moisturizing Lotion Nivea Crme  Nivea Skin Firming Lotion  NutraDerm 30 Skin Lotion  NutraDerm Skin Lotion  NutraDerm Therapeutic Skin Cream  NutraDerm Therapeutic Skin Lotion  ProShield Protective Hand Cream  Provon moisturizing lotion   FAILURE TO FOLLOW THESE INSTRUCTIONS MAY RESULT IN THE CANCELLATION OF YOUR SURGERY  PATIENT SIGNATURE_________________________________  NURSE SIGNATURE__________________________________  ________________________________________________________________________       Nicole Austin    An incentive spirometer is a tool that can help keep your lungs clear and active. This tool measures how well you are filling your lungs with each breath. Taking  long deep breaths may help reverse or decrease the chance of developing breathing (pulmonary) problems (especially infection) following: A long period of time when you are unable to move or be active. BEFORE THE PROCEDURE  If the spirometer includes an indicator to show your best effort, your  nurse or respiratory therapist will set it to a desired goal. If possible, sit up straight or lean slightly forward. Try not to slouch. Hold the incentive spirometer in an upright position. INSTRUCTIONS FOR USE  Sit on the edge of your bed if possible, or sit up as far as you can in bed or on a chair. Hold the incentive spirometer in an upright position. Breathe out normally. Place the mouthpiece in your mouth and seal your lips tightly around it. Breathe in slowly and as deeply as possible, raising the piston or the ball toward the top of the column. Hold your breath for 3-5 seconds or for as long as possible. Allow the piston or ball to fall to the bottom of the column. Remove the mouthpiece from your mouth and breathe out normally. Rest for a few seconds and repeat Steps 1 through 7 at least 10 times every 1-2 hours when you are awake. Take your time and take a few normal breaths between deep breaths. The spirometer may include an indicator to show your best effort. Use the indicator as a goal to work toward during each repetition. After each set of 10 deep breaths, practice coughing to be sure your lungs are clear. If you have an incision (the cut made at the time of surgery), support your incision when coughing by placing a pillow or rolled up towels firmly against it. Once you are able to get out of bed, walk around indoors and cough well. You may stop using the incentive spirometer when instructed by your caregiver.  RISKS AND COMPLICATIONS Take your time so you do not get dizzy or light-headed. If you are in pain, you may need to take or ask for pain medication before doing incentive spirometry. It is harder to take a deep breath if you are having pain. AFTER USE Rest and breathe slowly and easily. It can be helpful to keep track of a log of your progress. Your caregiver can provide you with a simple table to help with this. If you are using the spirometer at home, follow these  instructions: SEEK MEDICAL CARE IF:  You are having difficultly using the spirometer. You have trouble using the spirometer as often as instructed. Your pain medication is not giving enough relief while using the spirometer. You develop fever of 100.5 F (38.1 C) or higher.                                                                                                    SEEK IMMEDIATE MEDICAL CARE IF:  You cough up bloody sputum that had not been present before. You develop fever of 102 F (38.9 C) or greater. You develop worsening pain at or near the incision site. MAKE SURE YOU:  Understand these instructions. Will watch your condition.  Will get help right away if you are not doing well or get worse. Document Released: 06/25/2006 Document Revised: 05/07/2011 Document Reviewed: 08/26/2006 [facility] Patient Information 2014 [place], [place].      WHAT IS A BLOOD TRANSFUSION? Blood Transfusion Information  A transfusion is the replacement of blood or some of its parts. Blood is made up of multiple cells which provide different functions. Red blood cells carry oxygen and are used for blood loss replacement. White blood cells fight against infection. Platelets control bleeding. Plasma helps clot blood. Other blood products are available for specialized needs, such as hemophilia or other clotting disorders. BEFORE THE TRANSFUSION  Who gives blood for transfusions?  Healthy volunteers who are fully evaluated to make sure their blood is safe. This is blood bank blood. Transfusion therapy is the safest it has ever been in the practice of medicine. Before blood is taken from a donor, a complete history is taken to make sure that person has no history of diseases nor engages in risky social behavior (examples are intravenous drug use or sexual activity with multiple partners). The donor's travel history is screened to minimize risk of transmitting infections, such as malaria. The donated  blood is tested for signs of infectious diseases, such as HIV and hepatitis. The blood is then tested to be sure it is compatible with you in order to minimize the chance of a transfusion reaction. If you or a relative donates blood, this is often done in anticipation of surgery and is not appropriate for emergency situations. It takes many days to process the donated blood. RISKS AND COMPLICATIONS Although transfusion therapy is very safe and saves many lives, the main dangers of transfusion include:  Getting an infectious disease. Developing a transfusion reaction. This is an allergic reaction to something in the blood you were given. Every precaution is taken to prevent this. The decision to have a blood transfusion has been considered carefully by your caregiver before blood is given. Blood is not given unless the benefits outweigh the risks. AFTER THE TRANSFUSION Right after receiving a blood transfusion, you will usually feel much better and more energetic. This is especially true if your red blood cells have gotten low (anemic). The transfusion raises the level of the red blood cells which carry oxygen, and this usually causes an energy increase. The nurse administering the transfusion will monitor you carefully for complications. HOME CARE INSTRUCTIONS  No special instructions are needed after a transfusion. You may find your energy is better. Speak with your caregiver about any limitations on activity for underlying diseases you may have. SEEK MEDICAL CARE IF:  Your condition is not improving after your transfusion. You develop redness or irritation at the intravenous (IV) site. SEEK IMMEDIATE MEDICAL CARE IF:  Any of the following symptoms occur over the next 12 hours: Shaking chills. You have a temperature by mouth above 102 F (38.9 C), not controlled by medicine. Chest, back, or muscle pain. People around you feel you are not acting correctly or are confused. Shortness of breath or  difficulty breathing. Dizziness and fainting. You get a rash or develop hives. You have a decrease in urine output. Your urine turns a dark color or changes to pink, red, or brown. Any of the following symptoms occur over the next 10 days: You have a temperature by mouth above 102 F (38.9 C), not controlled by medicine. Shortness of breath. Weakness after normal activity. The white part of the eye turns yellow (jaundice). You have a  decrease in the amount of urine or are urinating less often. Your urine turns a dark color or changes to pink, red, or brown. Document Released: 02/10/2000 Document Revised: 05/07/2011 Document Reviewed: 09/29/2007 [facility] Patient Information 2014 [place], [place].         _______________________________________________________________________

## 2022-09-27 NOTE — Progress Notes (Addendum)
This patient NO SHOWED for her PST appt. Today. We made several attempts to call her but there was no answer.      COVID Vaccine received:  []  No [x]  Yes Date of any COVID positive Test in last 90 days:  PCP - Danelle Berry, PA-C  [phone] (Work)  [phone] (Fax)  Cardiologist -   Chest x-ray - 07-04-2022 1v  Epic EKG -  09-20-2022  scanned to Epic from PCP Stress Test -  ECHO - 2017  Epic Cardiac Cath -   PCR screen: [x]  Ordered & Completed           []   No Order but Needs PROFEND           []   N/A for this surgery  Surgery Plan:  []  Ambulatory                            [x]  Outpatient in bed                            []  Admit  Anesthesia:    []  General  [x]  Spinal                           []   Choice []   MAC  Pacemaker / ICD device [x]  No []  Yes   Spinal Cord Stimulator:[x]  No []  Yes       History of Sleep Apnea? []  No [x]  Yes   CPAP used?- []  No []  Yes    Does the patient monitor blood sugar?          []  No []  Yes  []  N/A  Patient has: []  NO Hx DM   [x]  Pre-DM                 []  DM1  []   DM2 Does patient have a [employer] or [employer]? []  No []  Yes   Fasting Blood Sugar Ranges-  Checks Blood Sugar _____ times a day  Blood Thinner / Instructions:  none Aspirin Instructions:  none  ERAS Protocol Ordered: []  No  [x]  Yes PRE-SURGERY []  ENSURE  [x]  G2  Patient is to be NPO after: 09:45  Comments: Patient was given the 5 CHG shower / bath instructions for THA surgery along with 2 bottles of the CHG soap. Patient will start this on:  Friday 10-05-2022       All questions were asked and answered, Patient voiced understanding of this process.   Activity level: Patient is able / unable to climb a flight of stairs without difficulty; []  No CP  []  No SOB, but would have ___   Patient can / can not perform ADLs without assistance.   Anesthesia review: Polypharmacy abuse, smokes POT, COPD- Home O2, hx Rhabdomyolysis admission after a fall 06-2022. Pre-DM, GERD, Hx MI ?  CVA, OSA-    , chronic long term opiate use, avascular necrosis, Bipolar, ADD, CKD3.    Patient denies shortness of breath, fever, cough and chest pain at PAT appointment.  Patient verbalized understanding and agreement to the Pre-Surgical Instructions that were given to them at this PAT appointment. Patient was also educated of the need to review these PAT instructions again prior to her surgery.I reviewed the appropriate phone numbers to call if they have any and questions or concerns.

## 2022-09-28 ENCOUNTER — Encounter (HOSPITAL_COMMUNITY)
Admission: RE | Admit: 2022-09-28 | Discharge: 2022-09-28 | Disposition: A | Discharge status: Home / Self Care | Payer: 59 | Source: Ambulatory Visit | Attending: Orthopedic Surgery | Admitting: Orthopedic Surgery

## 2022-09-28 DIAGNOSIS — Z01818 Encounter for other preprocedural examination: Secondary | ICD-10-CM

## 2022-09-28 DIAGNOSIS — R7303 Prediabetes: Secondary | ICD-10-CM

## 2022-09-28 DIAGNOSIS — Z79891 Long term (current) use of opiate analgesic: Secondary | ICD-10-CM

## 2022-09-28 NOTE — Addendum Note (Signed)
Addended by: Forde Radon on: 09/28/2022 01:14 PM   Modules accepted: Orders

## 2022-09-28 NOTE — Telephone Encounter (Signed)
Referral placed for podiatry. Called pt to inform her no answer unable to leave vm

## 2022-10-01 ENCOUNTER — Ambulatory Visit: Payer: 59 | Admitting: Nurse Practitioner

## 2022-10-05 DIAGNOSIS — J441 Chronic obstructive pulmonary disease with (acute) exacerbation: Secondary | ICD-10-CM | POA: Diagnosis not present

## 2022-10-05 DIAGNOSIS — J449 Chronic obstructive pulmonary disease, unspecified: Secondary | ICD-10-CM | POA: Diagnosis not present

## 2022-10-05 DIAGNOSIS — M810 Age-related osteoporosis without current pathological fracture: Secondary | ICD-10-CM | POA: Diagnosis not present

## 2022-10-07 DIAGNOSIS — J441 Chronic obstructive pulmonary disease with (acute) exacerbation: Secondary | ICD-10-CM | POA: Diagnosis not present

## 2022-10-07 DIAGNOSIS — J449 Chronic obstructive pulmonary disease, unspecified: Secondary | ICD-10-CM | POA: Diagnosis not present

## 2022-10-08 ENCOUNTER — Encounter (HOSPITAL_COMMUNITY): Admission: RE | Admit: 2022-10-08 | Payer: 59 | Source: Ambulatory Visit

## 2022-10-08 NOTE — Progress Notes (Signed)
Attempted to contact pt per phone number provided; no answer and unable to leave message in regards to surgical time change for tomorrow 10/09/2022. Also attempted to contact pts emergency contact; no answer but voicemail message left for return call back. At this time have received no return call back.

## 2022-10-09 ENCOUNTER — Ambulatory Visit (HOSPITAL_COMMUNITY): Payer: 59 | Admitting: Anesthesiology

## 2022-10-09 ENCOUNTER — Ambulatory Visit (HOSPITAL_COMMUNITY): Payer: 59

## 2022-10-09 ENCOUNTER — Other Ambulatory Visit: Payer: Self-pay

## 2022-10-09 ENCOUNTER — Encounter (HOSPITAL_COMMUNITY): Payer: Self-pay | Admitting: Orthopedic Surgery

## 2022-10-09 ENCOUNTER — Ambulatory Visit (HOSPITAL_BASED_OUTPATIENT_CLINIC_OR_DEPARTMENT_OTHER): Payer: 59 | Admitting: Anesthesiology

## 2022-10-09 ENCOUNTER — Encounter (HOSPITAL_COMMUNITY)
Admission: RE | Disposition: A | Discharge status: Home / Self Care | Payer: Self-pay | Source: Home / Self Care | Attending: Orthopedic Surgery

## 2022-10-09 ENCOUNTER — Observation Stay (HOSPITAL_COMMUNITY)
Admission: RE | Admit: 2022-10-09 | Discharge: 2022-10-10 | Disposition: A | Discharge status: Home / Self Care | Payer: 59 | Attending: Orthopedic Surgery | Admitting: Orthopedic Surgery

## 2022-10-09 ENCOUNTER — Observation Stay (HOSPITAL_COMMUNITY): Payer: 59

## 2022-10-09 DIAGNOSIS — Z87891 Personal history of nicotine dependence: Secondary | ICD-10-CM | POA: Diagnosis not present

## 2022-10-09 DIAGNOSIS — Z96642 Presence of left artificial hip joint: Secondary | ICD-10-CM | POA: Diagnosis not present

## 2022-10-09 DIAGNOSIS — M1612 Unilateral primary osteoarthritis, left hip: Principal | ICD-10-CM | POA: Insufficient documentation

## 2022-10-09 DIAGNOSIS — Z8673 Personal history of transient ischemic attack (TIA), and cerebral infarction without residual deficits: Secondary | ICD-10-CM | POA: Diagnosis not present

## 2022-10-09 DIAGNOSIS — Z79891 Long term (current) use of opiate analgesic: Secondary | ICD-10-CM

## 2022-10-09 DIAGNOSIS — Z79899 Other long term (current) drug therapy: Secondary | ICD-10-CM | POA: Insufficient documentation

## 2022-10-09 DIAGNOSIS — J45909 Unspecified asthma, uncomplicated: Secondary | ICD-10-CM | POA: Insufficient documentation

## 2022-10-09 DIAGNOSIS — J449 Chronic obstructive pulmonary disease, unspecified: Secondary | ICD-10-CM

## 2022-10-09 DIAGNOSIS — G20C Parkinsonism, unspecified: Secondary | ICD-10-CM | POA: Diagnosis not present

## 2022-10-09 DIAGNOSIS — Z96652 Presence of left artificial knee joint: Secondary | ICD-10-CM | POA: Diagnosis not present

## 2022-10-09 DIAGNOSIS — M87852 Other osteonecrosis, left femur: Secondary | ICD-10-CM | POA: Diagnosis not present

## 2022-10-09 DIAGNOSIS — M87052 Idiopathic aseptic necrosis of left femur: Principal | ICD-10-CM

## 2022-10-09 DIAGNOSIS — Z01818 Encounter for other preprocedural examination: Secondary | ICD-10-CM

## 2022-10-09 DIAGNOSIS — F418 Other specified anxiety disorders: Secondary | ICD-10-CM

## 2022-10-09 DIAGNOSIS — R7303 Prediabetes: Secondary | ICD-10-CM

## 2022-10-09 DIAGNOSIS — E785 Hyperlipidemia, unspecified: Secondary | ICD-10-CM | POA: Diagnosis not present

## 2022-10-09 HISTORY — PX: TOTAL HIP ARTHROPLASTY: SHX124

## 2022-10-09 LAB — CBC
HCT: 36.1 % (ref 36.0–46.0)
Hemoglobin: 11.8 g/dL — ABNORMAL LOW (ref 12.0–15.0)
MCH: 30.7 pg (ref 26.0–34.0)
MCHC: 32.7 g/dL (ref 30.0–36.0)
MCV: 94 fL (ref 80.0–100.0)
Platelets: 254 10*3/uL (ref 150–400)
RBC: 3.84 MIL/uL — ABNORMAL LOW (ref 3.87–5.11)
RDW: 12.9 % (ref 11.5–15.5)
WBC: 6.8 10*3/uL (ref 4.0–10.5)
nRBC: 0 % (ref 0.0–0.2)

## 2022-10-09 LAB — COMPREHENSIVE METABOLIC PANEL
ALT: 12 U/L (ref 0–44)
AST: 16 U/L (ref 15–41)
Albumin: 3.8 g/dL (ref 3.5–5.0)
Alkaline Phosphatase: 94 U/L (ref 38–126)
Anion gap: 10 (ref 5–15)
BUN: 8 mg/dL (ref 8–23)
CO2: 26 mmol/L (ref 22–32)
Calcium: 9.1 mg/dL (ref 8.9–10.3)
Chloride: 93 mmol/L — ABNORMAL LOW (ref 98–111)
Creatinine, Ser: 0.87 mg/dL (ref 0.44–1.00)
GFR, Estimated: >60 mL/min (ref >60)
Glucose, Bld: 82 mg/dL (ref 70–99)
Potassium: 3.7 mmol/L (ref 3.5–5.1)
Sodium: 129 mmol/L — ABNORMAL LOW (ref 135–145)
Total Bilirubin: 0.4 mg/dL (ref 0.3–1.2)
Total Protein: 7.5 g/dL (ref 6.5–8.1)

## 2022-10-09 LAB — TYPE AND SCREEN
ABO/RH(D): O POS
Antibody Screen: NEGATIVE

## 2022-10-09 LAB — SURGICAL PCR SCREEN
MRSA, PCR: POSITIVE — AB
Staphylococcus aureus: POSITIVE — AB

## 2022-10-09 SURGERY — ARTHROPLASTY, HIP, TOTAL, ANTERIOR APPROACH
Anesthesia: Spinal | Site: Hip | Laterality: Left

## 2022-10-09 MED ORDER — TRANEXAMIC ACID-NACL 1000-0.7 MG/100ML-% IV SOLN
1000.0000 mg | Freq: Once | INTRAVENOUS | Status: AC
  Administered 2022-10-09: 1000 mg via INTRAVENOUS
  Filled 2022-10-09: qty 100

## 2022-10-09 MED ORDER — ONDANSETRON HCL 4 MG/2ML IJ SOLN
INTRAMUSCULAR | Status: AC
  Filled 2022-10-09: qty 2

## 2022-10-09 MED ORDER — DEXAMETHASONE SODIUM PHOSPHATE 10 MG/ML IJ SOLN: 8.0000 mg | Freq: Once | INTRAMUSCULAR | Status: DC

## 2022-10-09 MED ORDER — KETOROLAC TROMETHAMINE 30 MG/ML IJ SOLN
INTRAMUSCULAR | Status: DC | PRN
  Administered 2022-10-09: 30 mg via INTRA_ARTICULAR

## 2022-10-09 MED ORDER — EPINEPHRINE PF 1 MG/ML IJ SOLN
INTRAMUSCULAR | Status: AC
  Filled 2022-10-09: qty 1

## 2022-10-09 MED ORDER — 0.9 % SODIUM CHLORIDE (POUR BTL) OPTIME
TOPICAL | Status: DC | PRN
  Administered 2022-10-09: 1000 mL

## 2022-10-09 MED ORDER — AMISULPRIDE (ANTIEMETIC) 5 MG/2ML IV SOLN: 10.0000 mg | Freq: Once | INTRAVENOUS | Status: DC | PRN

## 2022-10-09 MED ORDER — POVIDONE-IODINE 10 % EX SWAB: 2.0000 | Freq: Once | CUTANEOUS | Status: DC

## 2022-10-09 MED ORDER — FUROSEMIDE 20 MG PO TABS: 10.0000 mg | ORAL_TABLET | Freq: Every day | ORAL | Status: DC | PRN

## 2022-10-09 MED ORDER — BUPIVACAINE-EPINEPHRINE (PF) 0.25% -1:200000 IJ SOLN
INTRAMUSCULAR | Status: DC | PRN
  Administered 2022-10-09: 30 mL

## 2022-10-09 MED ORDER — BUPIVACAINE HCL 0.25 % IJ SOLN
INTRAMUSCULAR | Status: AC
  Filled 2022-10-09: qty 1

## 2022-10-09 MED ORDER — VANCOMYCIN HCL 1000 MG IV SOLR
INTRAVENOUS | Status: AC
  Filled 2022-10-09: qty 20

## 2022-10-09 MED ORDER — VANCOMYCIN HCL 1500 MG/300ML IV SOLN
1500.0000 mg | INTRAVENOUS | Status: AC
  Administered 2022-10-09: 1500 mg via INTRAVENOUS
  Filled 2022-10-09 (×2): qty 300

## 2022-10-09 MED ORDER — ROPINIROLE HCL ER 4 MG PO TB24
4.0000 mg | ORAL_TABLET | Freq: Every day | ORAL | Status: DC
  Administered 2022-10-09: 4 mg via ORAL
  Filled 2022-10-09: qty 1

## 2022-10-09 MED ORDER — LACTATED RINGERS IV SOLN: INTRAVENOUS | Status: DC

## 2022-10-09 MED ORDER — CEFAZOLIN SODIUM-DEXTROSE 2-4 GM/100ML-% IV SOLN
2.0000 g | Freq: Four times a day (QID) | INTRAVENOUS | Status: AC
  Administered 2022-10-09 – 2022-10-10 (×2): 2 g via INTRAVENOUS
  Filled 2022-10-09 (×2): qty 100

## 2022-10-09 MED ORDER — TRANEXAMIC ACID-NACL 1000-0.7 MG/100ML-% IV SOLN
1000.0000 mg | INTRAVENOUS | Status: AC
  Administered 2022-10-09: 1000 mg via INTRAVENOUS
  Filled 2022-10-09: qty 100

## 2022-10-09 MED ORDER — PROPOFOL 10 MG/ML IV BOLUS
INTRAVENOUS | Status: AC
  Filled 2022-10-09: qty 20

## 2022-10-09 MED ORDER — HYDROMORPHONE HCL 1 MG/ML IJ SOLN
INTRAMUSCULAR | Status: AC
  Filled 2022-10-09: qty 1

## 2022-10-09 MED ORDER — HYDROMORPHONE HCL 1 MG/ML IJ SOLN
0.2500 mg | INTRAMUSCULAR | Status: DC | PRN
  Administered 2022-10-09: 0.5 mg via INTRAVENOUS

## 2022-10-09 MED ORDER — BISACODYL 10 MG RE SUPP: 10.0000 mg | Freq: Every day | RECTAL | Status: DC | PRN

## 2022-10-09 MED ORDER — OXYCODONE HCL 5 MG PO TABS
5.0000 mg | ORAL_TABLET | ORAL | Status: DC | PRN
  Administered 2022-10-10: 10 mg via ORAL
  Filled 2022-10-09: qty 2

## 2022-10-09 MED ORDER — DIVALPROEX SODIUM 250 MG PO DR TAB
250.0000 mg | DELAYED_RELEASE_TABLET | Freq: Two times a day (BID) | ORAL | Status: DC
  Administered 2022-10-09 – 2022-10-10 (×2): 250 mg via ORAL
  Filled 2022-10-09 (×2): qty 1

## 2022-10-09 MED ORDER — METOCLOPRAMIDE HCL 5 MG PO TABS: 5.0000 mg | ORAL_TABLET | Freq: Three times a day (TID) | ORAL | Status: DC | PRN

## 2022-10-09 MED ORDER — CHLORHEXIDINE GLUCONATE 4 % EX SOLN: 1.0000 | CUTANEOUS | 1 refills | Status: DC

## 2022-10-09 MED ORDER — PHENYLEPHRINE HCL-NACL 20-0.9 MG/250ML-% IV SOLN
INTRAVENOUS | Status: DC | PRN
  Administered 2022-10-09: 30 ug/min via INTRAVENOUS

## 2022-10-09 MED ORDER — DEXAMETHASONE SODIUM PHOSPHATE 10 MG/ML IJ SOLN
10.0000 mg | Freq: Once | INTRAMUSCULAR | Status: AC
  Administered 2022-10-10: 10 mg via INTRAVENOUS
  Filled 2022-10-09: qty 1

## 2022-10-09 MED ORDER — CHLORHEXIDINE GLUCONATE 0.12 % MT SOLN
15.0000 mL | Freq: Once | OROMUCOSAL | Status: AC
  Administered 2022-10-09: 15 mL via OROMUCOSAL

## 2022-10-09 MED ORDER — PROPOFOL 1000 MG/100ML IV EMUL
INTRAVENOUS | Status: AC
  Filled 2022-10-09: qty 100

## 2022-10-09 MED ORDER — FENTANYL CITRATE (PF) 100 MCG/2ML IJ SOLN
INTRAMUSCULAR | Status: DC | PRN
  Administered 2022-10-09: 100 ug via INTRAVENOUS

## 2022-10-09 MED ORDER — ACETAMINOPHEN 325 MG PO TABS: 325.0000 mg | ORAL_TABLET | Freq: Once | ORAL | Status: DC | PRN

## 2022-10-09 MED ORDER — DIPHENHYDRAMINE HCL 12.5 MG/5ML PO ELIX: 12.5000 mg | ORAL_SOLUTION | ORAL | Status: DC | PRN

## 2022-10-09 MED ORDER — MUPIROCIN 2 % EX OINT: 1.0000 | TOPICAL_OINTMENT | Freq: Two times a day (BID) | CUTANEOUS | 0 refills | Status: AC

## 2022-10-09 MED ORDER — ALPRAZOLAM 0.5 MG PO TABS
0.5000 mg | ORAL_TABLET | Freq: Two times a day (BID) | ORAL | Status: DC
  Administered 2022-10-09 – 2022-10-10 (×2): 0.5 mg via ORAL
  Filled 2022-10-09 (×2): qty 1

## 2022-10-09 MED ORDER — CEFAZOLIN SODIUM-DEXTROSE 2-4 GM/100ML-% IV SOLN
2.0000 g | INTRAVENOUS | Status: AC
  Administered 2022-10-09: 2 g via INTRAVENOUS
  Filled 2022-10-09: qty 100

## 2022-10-09 MED ORDER — DOCUSATE SODIUM 100 MG PO CAPS
100.0000 mg | ORAL_CAPSULE | Freq: Two times a day (BID) | ORAL | Status: DC
  Administered 2022-10-09 – 2022-10-10 (×2): 100 mg via ORAL
  Filled 2022-10-09 (×2): qty 1

## 2022-10-09 MED ORDER — MIDAZOLAM HCL 2 MG/2ML IJ SOLN
INTRAMUSCULAR | Status: AC
  Filled 2022-10-09: qty 2

## 2022-10-09 MED ORDER — ASPIRIN 81 MG PO CHEW
81.0000 mg | CHEWABLE_TABLET | Freq: Two times a day (BID) | ORAL | Status: DC
  Administered 2022-10-09 – 2022-10-10 (×2): 81 mg via ORAL
  Filled 2022-10-09 (×2): qty 1

## 2022-10-09 MED ORDER — MENTHOL 3 MG MT LOZG: 1.0000 | LOZENGE | OROMUCOSAL | Status: DC | PRN

## 2022-10-09 MED ORDER — VANCOMYCIN HCL 1000 MG IV SOLR
INTRAVENOUS | Status: DC | PRN
  Administered 2022-10-09: 1000 mg via TOPICAL

## 2022-10-09 MED ORDER — BUPIVACAINE IN DEXTROSE 0.75-8.25 % IT SOLN
INTRATHECAL | Status: DC | PRN
  Administered 2022-10-09: 1.8 mL via INTRATHECAL

## 2022-10-09 MED ORDER — METOCLOPRAMIDE HCL 5 MG/ML IJ SOLN: 5.0000 mg | Freq: Three times a day (TID) | INTRAMUSCULAR | Status: DC | PRN

## 2022-10-09 MED ORDER — PROMETHAZINE HCL 25 MG/ML IJ SOLN: 6.2500 mg | INTRAMUSCULAR | Status: DC | PRN

## 2022-10-09 MED ORDER — PROPOFOL 500 MG/50ML IV EMUL
INTRAVENOUS | Status: DC | PRN
  Administered 2022-10-09: 50 ug/kg/min via INTRAVENOUS

## 2022-10-09 MED ORDER — BUPIVACAINE-EPINEPHRINE 0.25% -1:200000 IJ SOLN
INTRAMUSCULAR | Status: AC
  Filled 2022-10-09: qty 1

## 2022-10-09 MED ORDER — ESCITALOPRAM OXALATE 20 MG PO TABS
20.0000 mg | ORAL_TABLET | Freq: Every day | ORAL | Status: DC
  Administered 2022-10-10: 20 mg via ORAL
  Filled 2022-10-09: qty 1

## 2022-10-09 MED ORDER — DEXAMETHASONE SODIUM PHOSPHATE 10 MG/ML IJ SOLN
INTRAMUSCULAR | Status: AC
  Filled 2022-10-09: qty 1

## 2022-10-09 MED ORDER — CHLORHEXIDINE GLUCONATE CLOTH 2 % EX PADS
6.0000 | MEDICATED_PAD | Freq: Every day | CUTANEOUS | Status: DC
  Administered 2022-10-10: 6 via TOPICAL

## 2022-10-09 MED ORDER — MUPIROCIN 2 % EX OINT
1.0000 | TOPICAL_OINTMENT | Freq: Two times a day (BID) | CUTANEOUS | Status: DC
  Administered 2022-10-09 – 2022-10-10 (×2): 1 via NASAL
  Filled 2022-10-09: qty 22

## 2022-10-09 MED ORDER — SODIUM CHLORIDE (PF) 0.9 % IJ SOLN
INTRAMUSCULAR | Status: AC
  Filled 2022-10-09: qty 50

## 2022-10-09 MED ORDER — LURASIDONE HCL 20 MG PO TABS
60.0000 mg | ORAL_TABLET | Freq: Every day | ORAL | Status: DC
  Filled 2022-10-09: qty 3

## 2022-10-09 MED ORDER — LIDOCAINE HCL (PF) 2 % IJ SOLN
INTRAMUSCULAR | Status: AC
  Filled 2022-10-09: qty 5

## 2022-10-09 MED ORDER — PREGABALIN 50 MG PO CAPS
50.0000 mg | ORAL_CAPSULE | Freq: Two times a day (BID) | ORAL | Status: DC
  Administered 2022-10-09 – 2022-10-10 (×2): 50 mg via ORAL
  Filled 2022-10-09 (×2): qty 1

## 2022-10-09 MED ORDER — SODIUM CHLORIDE (PF) 0.9 % IJ SOLN
INTRAMUSCULAR | Status: DC | PRN
  Administered 2022-10-09: 30 mL

## 2022-10-09 MED ORDER — SODIUM CHLORIDE 0.9 % IV SOLN: INTRAVENOUS | Status: DC

## 2022-10-09 MED ORDER — HYDROMORPHONE HCL 1 MG/ML IJ SOLN
0.5000 mg | INTRAMUSCULAR | Status: DC | PRN
  Administered 2022-10-09 – 2022-10-10 (×2): 1 mg via INTRAVENOUS
  Filled 2022-10-09 (×2): qty 1

## 2022-10-09 MED ORDER — PHENOL 1.4 % MT LIQD: 1.0000 | OROMUCOSAL | Status: DC | PRN

## 2022-10-09 MED ORDER — POLYETHYLENE GLYCOL 3350 17 G PO PACK
17.0000 g | PACK | Freq: Two times a day (BID) | ORAL | Status: DC
  Administered 2022-10-09 – 2022-10-10 (×2): 17 g via ORAL
  Filled 2022-10-09 (×2): qty 1

## 2022-10-09 MED ORDER — DEXAMETHASONE SODIUM PHOSPHATE 10 MG/ML IJ SOLN
INTRAMUSCULAR | Status: DC | PRN
  Administered 2022-10-09: 10 mg via INTRAVENOUS

## 2022-10-09 MED ORDER — ONDANSETRON HCL 4 MG/2ML IJ SOLN
INTRAMUSCULAR | Status: DC | PRN
  Administered 2022-10-09: 4 mg via INTRAVENOUS

## 2022-10-09 MED ORDER — PROPOFOL 10 MG/ML IV BOLUS
INTRAVENOUS | Status: DC | PRN
Start: 2022-10-09 — End: 2022-10-09
  Administered 2022-10-09: 20 mg via INTRAVENOUS
  Administered 2022-10-09: 30 mg via INTRAVENOUS

## 2022-10-09 MED ORDER — ACETAMINOPHEN 160 MG/5ML PO SOLN: 325.0000 mg | Freq: Once | ORAL | Status: DC | PRN

## 2022-10-09 MED ORDER — MIDAZOLAM HCL 5 MG/5ML IJ SOLN
INTRAMUSCULAR | Status: DC | PRN
  Administered 2022-10-09: 2 mg via INTRAVENOUS

## 2022-10-09 MED ORDER — FENTANYL CITRATE (PF) 100 MCG/2ML IJ SOLN
INTRAMUSCULAR | Status: AC
  Filled 2022-10-09: qty 2

## 2022-10-09 MED ORDER — KETOROLAC TROMETHAMINE 30 MG/ML IJ SOLN
INTRAMUSCULAR | Status: AC
  Filled 2022-10-09: qty 1

## 2022-10-09 MED ORDER — CYCLOBENZAPRINE HCL 5 MG PO TABS
5.0000 mg | ORAL_TABLET | Freq: Three times a day (TID) | ORAL | Status: DC | PRN
  Administered 2022-10-10: 5 mg via ORAL
  Filled 2022-10-09 (×2): qty 1

## 2022-10-09 MED ORDER — MEPERIDINE HCL 50 MG/ML IJ SOLN: 6.2500 mg | INTRAMUSCULAR | Status: DC | PRN

## 2022-10-09 MED ORDER — OXYCODONE HCL 5 MG PO TABS
10.0000 mg | ORAL_TABLET | ORAL | Status: DC | PRN
  Administered 2022-10-09 – 2022-10-10 (×3): 15 mg via ORAL
  Filled 2022-10-09 (×3): qty 3

## 2022-10-09 MED ORDER — CHLORHEXIDINE GLUCONATE CLOTH 2 % EX PADS: 6.0000 | MEDICATED_PAD | Freq: Every day | CUTANEOUS | Status: DC

## 2022-10-09 MED ORDER — ORAL CARE MOUTH RINSE: 15.0000 mL | Freq: Once | OROMUCOSAL | Status: AC

## 2022-10-09 MED ORDER — ACETAMINOPHEN 500 MG PO TABS
1000.0000 mg | ORAL_TABLET | Freq: Four times a day (QID) | ORAL | Status: DC
  Administered 2022-10-09 – 2022-10-10 (×3): 1000 mg via ORAL
  Filled 2022-10-09 (×3): qty 2

## 2022-10-09 MED ORDER — STERILE WATER FOR IRRIGATION IR SOLN
Status: DC | PRN
  Administered 2022-10-09: 2000 mL

## 2022-10-09 MED ORDER — ACETAMINOPHEN 10 MG/ML IV SOLN: 1000.0000 mg | Freq: Once | INTRAVENOUS | Status: DC | PRN

## 2022-10-09 SURGICAL SUPPLY — 43 items
ADH SKN CLS APL DERMABOND .7 (GAUZE/BANDAGES/DRESSINGS) ×1
BAG COUNTER SPONGE SURGICOUNT (BAG) IMPLANT
BAG SPEC THK2 15X12 ZIP CLS (MISCELLANEOUS)
BAG SPNG CNTER NS LX DISP (BAG)
BAG ZIPLOCK 12X15 (MISCELLANEOUS) IMPLANT
BLADE SAG 18X100X1.27 (BLADE) ×1 IMPLANT
COVER PERINEAL POST (MISCELLANEOUS) ×1 IMPLANT
COVER SURGICAL LIGHT HANDLE (MISCELLANEOUS) ×1 IMPLANT
CUP ACETBLR 52 OD PINNACLE (Hips) IMPLANT
DERMABOND ADVANCED .7 DNX12 (GAUZE/BANDAGES/DRESSINGS) ×1 IMPLANT
DRAPE FOOT SWITCH (DRAPES) ×1 IMPLANT
DRAPE STERI IOBAN 125X83 (DRAPES) ×1 IMPLANT
DRAPE U-SHAPE 47X51 STRL (DRAPES) ×2 IMPLANT
DRESSING AQUACEL AG SP 3.5X10 (GAUZE/BANDAGES/DRESSINGS) ×1 IMPLANT
DRSG AQUACEL AG ADV 3.5X10 (GAUZE/BANDAGES/DRESSINGS) IMPLANT
DRSG AQUACEL AG SP 3.5X10 (GAUZE/BANDAGES/DRESSINGS) ×1
DURAPREP 26ML APPLICATOR (WOUND CARE) ×1 IMPLANT
ELECT REM PT RETURN 15FT ADLT (MISCELLANEOUS) ×1 IMPLANT
FEM STEM 12/14 TAPER SZ 4 HIP (Orthopedic Implant) ×1 IMPLANT
FEMORAL STEM 12/14 TPR SZ4 HIP (Orthopedic Implant) IMPLANT
GLOVE BIO SURGEON STRL SZ 6 (GLOVE) ×1 IMPLANT
GLOVE BIOGEL PI IND STRL 6.5 (GLOVE) ×1 IMPLANT
GLOVE BIOGEL PI IND STRL 7.5 (GLOVE) ×1 IMPLANT
GLOVE ORTHO TXT STRL SZ7.5 (GLOVE) ×2 IMPLANT
GOWN STRL REUS W/ TWL LRG LVL3 (GOWN DISPOSABLE) ×2 IMPLANT
GOWN STRL REUS W/TWL LRG LVL3 (GOWN DISPOSABLE) ×2
HEAD CERAMIC DELTA 36 PLUS 1.5 (Hips) IMPLANT
HOLDER FOLEY CATH W/STRAP (MISCELLANEOUS) ×1 IMPLANT
KIT TURNOVER KIT A (KITS) IMPLANT
LINER NEUTRAL 52X36MM PLUS 4 (Liner) IMPLANT
NDL SAFETY ECLIP 18X1.5 (MISCELLANEOUS) IMPLANT
PACK ANTERIOR HIP CUSTOM (KITS) ×1 IMPLANT
SCREW 6.5MMX30MM (Screw) IMPLANT
SUT MNCRL AB 4-0 PS2 18 (SUTURE) ×1 IMPLANT
SUT STRATAFIX 0 PDS 27 VIOLET (SUTURE) ×1
SUT VIC AB 1 CT1 36 (SUTURE) ×3 IMPLANT
SUT VIC AB 2-0 CT1 27 (SUTURE) ×2
SUT VIC AB 2-0 CT1 TAPERPNT 27 (SUTURE) ×2 IMPLANT
SUTURE STRATFX 0 PDS 27 VIOLET (SUTURE) ×1 IMPLANT
SYR 3ML LL SCALE MARK (SYRINGE) IMPLANT
TRAY FOLEY MTR SLVR 16FR STAT (SET/KITS/TRAYS/PACK) IMPLANT
TUBE SUCTION HIGH CAP CLEAR NV (SUCTIONS) ×1 IMPLANT
WATER STERILE IRR 1000ML POUR (IV SOLUTION) ×1 IMPLANT

## 2022-10-09 NOTE — H&P (Signed)
TOTAL HIP ADMISSION H&P  Patient is admitted for left total hip arthroplasty.  Therapy Plans: HEP Disposition: Home with sister Planned DVT Prophylaxis: aspirin 81mg  BID DME needed: none PCP: York Ram (PCP) TXA: IV Allergies: NKDA Anesthesia Concerns: none BMI: 43 Last HgbA1c: Not diabetic   Other: - Takes oxycodone 10 mg TID normally but got off of this for surgery!! - No hx of VTE or cancer - flexeril, oxy, tylenol, celebrex   Subjective:  Chief Complaint: left hip pain  HPI: Nicole Austin, 62 y.o. female, has a history of pain and functional disability in the left hip(s) due to arthritis and patient has failed non-surgical conservative treatments for greater than 12 weeks to include NSAID's and/or analgesics and activity modification.  Onset of symptoms was gradual starting 2 years ago with gradually worsening course since that time.The patient noted no past surgery on the left hip(s).  Patient currently rates pain in the left hip at 8 out of 10 with activity. Patient has worsening of pain with activity and weight bearing, pain that interfers with activities of daily living, and pain with passive range of motion. Patient has evidence of joint space narrowing by imaging studies. This condition presents safety issues increasing the risk of falls. There is no current active infection.  Patient Active Problem List   Diagnosis Date Noted   Hyponatremia 07/09/2022   Iron deficiency anemia 07/07/2022   History of methicillin resistant staphylococcus aureus (MRSA) 05/03/2022   Avascular necrosis of bones of both hips (HCC) 05/03/2022   Chronic prescription benzodiazepine use 05/03/2022   Polypharmacy 04/09/2022   Sinus bradycardia 04/09/2022   Depression with anxiety 04/09/2022   MRSA (methicillin resistant staph aureus) culture positive 05/04/2021   Prolonged QT interval 01/01/2021   Bipolar disorder with depression (HCC) 02/25/2017   ADD (attention deficit disorder)  02/25/2017   Marijuana use 02/13/2017   Neuropathy, peripheral 05/07/2016   Vitamin B12 deficiency 10/24/2015   Fibromyalgia 08/30/2015   OP (osteoporosis) 06/30/2015   Sleep apnea 06/30/2015   HLD (hyperlipidemia) 05/13/2015   Obesity, Class III, BMI 40-49.9 (morbid obesity) (HCC) 08/16/2014   Low back pain with sciatica 08/04/2014   Polysubstance abuse (HCC) 07/04/2014   Anxiety, generalized 11/24/2013   COPD (chronic obstructive pulmonary disease) (HCC) 11/18/2013   Osteoarthritis of knee, unspecified 07/15/2013   Past Medical History:  Diagnosis Date   ADHD (attention deficit hyperactivity disorder)    Apnea, sleep 06/30/2015   Arthritis    Asthma    Asthma with acute exacerbation 06/30/2015   Benign neoplasm of ascending colon    Bipolar 1 disorder (HCC)    Chronic pain    COPD, moderate (HCC) 11/18/2013   Depression    DOE (dyspnea on exertion)    Fall 07/04/2022   GERD (gastroesophageal reflux disease) 08/30/2015   Gout 08/16/2014   History of acute myocardial infarction 06/30/2015   History of panic attacks    HPV (human papilloma virus) infection 07/17/2013   Hyperlipidemia    Left knee pain 07/06/2022   Low HDL (under 40) 08/30/2015   MI (myocardial infarction) (HCC) 2018   MRSA infection 2023   groin abscess   Nose colonized with MRSA 03/21/2022   a.) PCR (+) prior to LEFT THA   Osteoporosis    Parkinson disease    Polyp of sigmoid colon    Pre-diabetes    Prediabetes 05/20/2016   PTSD (post-traumatic stress disorder)    Restless leg syndrome    Rhabdomyolysis  Sleep apnea 06/30/2015   Stage 3 severe COPD by GOLD classification (HCC) 11/18/2013   Stroke (HCC) 2018   right arm weakness   Traumatic rhabdomyolysis (HCC) 07/04/2022   Tremors of nervous system    hands   Uncomplicated asthma 06/30/2015   Varicella 02/25/2017    Past Surgical History:  Procedure Laterality Date   BACK SURGERY     lumbar   CATARACT EXTRACTION W/PHACO Right  01/01/2019   Procedure: CATARACT EXTRACTION PHACO AND INTRAOCULAR LENS PLACEMENT (IOC) right vision blue;  Surgeon: Elliot Cousin, MD;  Location: [facility];  Service: Ophthalmology;  Laterality: Right;  [place] 00:39.4 CDE 5.49 Fluid Pack lot # [id number]   CATARACT EXTRACTION W/PHACO Left 01/29/2019   Procedure: CATARACT EXTRACTION PHACO AND INTRAOCULAR LENS PLACEMENT (IOC) LEFT Vision Blue;  Surgeon: Elliot Cousin, MD;  Location: [facility];  Service: Ophthalmology;  Laterality: Left;  [place] 00:51.1 CDE 4.38 Fluid Pack Lot # [id number]   COLONOSCOPY WITH PROPOFOL N/A 08/07/2017   Procedure: COLONOSCOPY WITH PROPOFOL;  Surgeon: Pasty Spillers, MD;  Location: [facility];  Service: Endoscopy;  Laterality: N/A;   HAND SURGERY Left    fractured with pins   REPLACEMENT TOTAL KNEE Left 2016   SPINAL CORD STIMULATOR INSERTION  2014   SPINAL CORD STIMULATOR REMOVAL  2014   TUBAL LIGATION      No current facility-administered medications for this encounter.   Current Outpatient Medications  Medication Sig Dispense Refill Last Dose   ALPRAZolam (XANAX) 0.5 MG tablet Take 1 tablet (0.5 mg total) by mouth 2 (two) times daily. 10 tablet 0    divalproex (DEPAKOTE) 250 MG DR tablet Take 1 tablet (250 mg total) by mouth 2 (two) times daily. 60 tablet 2    escitalopram (LEXAPRO) 20 MG tablet Take 20 mg by mouth daily.      Lurasidone HCl (LATUDA) 60 MG TABS Take 1 tablet (60 mg total) by mouth daily with breakfast. 30 tablet 0    Multiple Vitamins-Minerals (MULTIVITAMIN WITH MINERALS) tablet Take 1 tablet by mouth daily.      nystatin (MYCOSTATIN/NYSTOP) powder Apply 1 Application topically 3 (three) times daily. (Patient taking differently: Apply 1 Application topically 3 (three) times daily as needed (irritation).) 15 g 0    pregabalin (LYRICA) 25 MG capsule Take 1-2 capsules (25-50 mg total) by mouth 2 (two) times daily. (Patient taking differently: Take 50 mg by mouth 2 (two) times daily.) 180 capsule 1     rOPINIRole (REQUIP XL) 4 MG 24 hr tablet Take 4 mg by mouth at bedtime.      tiZANidine (ZANAFLEX) 4 MG tablet Take 4 mg by mouth every 6 (six) hours as needed for muscle spasms.      traMADol (ULTRAM) 50 MG tablet Take 50 mg by mouth daily as needed for severe pain.      acetaminophen (TYLENOL) 500 MG tablet Take 2 tablets (1,000 mg total) by mouth every 8 (eight) hours as needed for moderate pain, mild pain, fever or headache. (Patient not taking: Reported on 09/25/2022) 90 tablet 0 Not Taking   albuterol (VENTOLIN HFA) 108 (90 Base) MCG/ACT inhaler Inhale 2 puffs into the lungs every 4 (four) hours as needed for wheezing or shortness of breath. (Patient not taking: Reported on 09/25/2022) 18 g 2 Not Taking   cyclobenzaprine (FLEXERIL) 5 MG tablet Take 1 tablet (5 mg total) by mouth 3 (three) times daily as needed for muscle spasms. (Patient not taking: Reported on 09/25/2022) 20 tablet 0 Not  Taking   diclofenac (VOLTAREN) 50 MG EC tablet Take 1 tablet (50 mg total) by mouth 2 (two) times daily. (Patient not taking: Reported on 09/25/2022) 30 tablet 0 Not Taking   furosemide (LASIX) 20 MG tablet Take 0.5-1 tablets (10-20 mg total) by mouth daily as needed for fluid or edema. Take in am with potassium PRN 5 tablet 0    oxyCODONE (ROXICODONE) 5 MG immediate release tablet Take 1 tablet (5 mg total) by mouth every 8 (eight) hours as needed. (Patient not taking: Reported on 09/25/2022) 6 tablet 0 Not Taking   polyethylene glycol (MIRALAX / GLYCOLAX) 17 g packet Take 17 g by mouth daily. (Patient not taking: Reported on 09/25/2022) 30 each 0 Not Taking   potassium chloride SA (KLOR-CON M) 20 MEQ tablet Take 1 tablet (20 mEq total) by mouth daily as needed (take potassium supplement when taking lasix for EDEMA). (Patient not taking: Reported on 09/25/2022) 5 tablet 0 Not Taking   senna-docusate (SENOKOT-S) 8.6-50 MG tablet Take 1 tablet by mouth 2 (two) times daily. (Patient not taking: Reported on 09/25/2022) 100  tablet 0 Not Taking   Tiotropium Bromide Monohydrate (SPIRIVA RESPIMAT) 2.5 MCG/ACT AERS Inhale 2 puffs into the lungs daily. (Patient not taking: Reported on 09/25/2022) 1 each 0 Not Taking   Allergies  Allergen Reactions   Chantix [Varenicline] Nausea Only   Glycopyrrolate Rash    Oral irritation    Social History   Tobacco Use   Smoking status: Former    Current packs/day: 0.00    Average packs/day: 0.5 packs/day for 36.0 years (18.0 ttl pk-yrs)    Types: Cigarettes    Start date: 03/13/1986    Quit date: 03/13/2022    Years since quitting: 0.5    Passive exposure: Past   Smokeless tobacco: Never   Tobacco comments:    Pt using nicotine patches; 1/2 PPD as of 03/21/21  Substance Use Topics   Alcohol use: No    Alcohol/week: 0.0 standard drinks of alcohol    Family History  Problem Relation Age of Onset   Hernia Mother    Heart disease Mother    OCD Mother    Diabetes Mother    Parkinson's disease Father    Bipolar disorder Sister    Schizophrenia Sister    ADD / ADHD Sister    Alcohol abuse Brother    Bipolar disorder Sister    Paranoid behavior Sister    ADD / ADHD Sister    ADD / ADHD Son    Dementia Maternal Grandmother    Emphysema Maternal Grandfather    ADD / ADHD Son    ADD / ADHD Son    Depression Son      Review of Systems  Constitutional:  Negative for chills and fever.  Respiratory:  Negative for cough and shortness of breath.   Cardiovascular:  Negative for chest pain.  Gastrointestinal:  Negative for nausea and vomiting.  Musculoskeletal:  Positive for arthralgias.     Objective:  Physical Exam Obese and well developed. General: Alert and oriented x3, cooperative and pleasant, no acute distress. Head: normocephalic, atraumatic, neck supple. Eyes: EOMI.  Musculoskeletal: Left hip exam: No recurrent concerns for yeast infection involving her perineum Pain with hip flexion internal rotation Normal right hip exam Weakness with active hip  flexion attempt in a seated position Moderate LE edema, no erythema or calf tenderness  Calves soft and nontender. Motor function intact in LE. Strength 5/5 LE bilaterally. Neuro: Distal pulses  2+. Sensation to light touch intact in LE.  Vital signs in last 24 hours:    Labs:   Estimated body mass index is 41.66 kg/m as calculated from the following:   Height as of 09/17/22: 5\' 1"  (1.549 m).   Weight as of 09/17/22: 100 kg.   Imaging Review Plain radiographs demonstrate severe degenerative joint disease of the left hip(s). The bone quality appears to be adequate for age and reported activity level.      Assessment/Plan:  End stage arthritis, left hip(s)  The patient history, physical examination, clinical judgement of the provider and imaging studies are consistent with end stage degenerative joint disease of the left hip(s) and total hip arthroplasty is deemed medically necessary. The treatment options including medical management, injection therapy, arthroscopy and arthroplasty were discussed at length. The risks and benefits of total hip arthroplasty were presented and reviewed. The risks due to aseptic loosening, infection, stiffness, dislocation/subluxation,  thromboembolic complications and other imponderables were discussed.  The patient acknowledged the explanation, agreed to proceed with the plan and consent was signed. Patient is being admitted for inpatient treatment for surgery, pain control, PT, OT, prophylactic antibiotics, VTE prophylaxis, progressive ambulation and ADL's and discharge planning.The patient is planning to be discharged  home.   Rosalene Billings, PA-C Orthopedic Surgery [facility] [place] [phone]

## 2022-10-09 NOTE — Anesthesia Procedure Notes (Signed)
Date/Time: 10/09/2022 11:35 AM  Performed by: Florene Route, CRNAOxygen Delivery Method: Simple face mask

## 2022-10-09 NOTE — Discharge Instructions (Signed)

## 2022-10-09 NOTE — Care Plan (Signed)
Ortho Bundle Case Management Note  Patient Details  Name: Nicole Austin MRN: [id number] Date of Birth: [birth year]  L THA on 10-09-22 DCP:  Home with sister DME:  No needs, has a RW PT:  HEP                   DME Arranged:  N/A DME Agency:  NA  HH Arranged:  NA HH Agency:  NA  Additional Comments: Please contact me with any questions of if this plan should need to change.  Ennis Forts, RN,CCM [facility]  [phone] 10/09/2022, 10:40 AM

## 2022-10-09 NOTE — Anesthesia Preprocedure Evaluation (Addendum)
Anesthesia Evaluation  Patient identified by MRN, date of birth, ID band Patient awake    Reviewed: Allergy & Precautions, NPO status , Patient's Chart, lab work & pertinent test results  Airway Mallampati: I  TM Distance: >3 FB Neck ROM: Full    Dental  (+) Dental Advisory Given, Upper Dentures, Lower Dentures   Pulmonary asthma , sleep apnea , COPD, Patient abstained from smoking., former smoker   breath sounds clear to auscultation       Cardiovascular + Past MI and + DOE   Rhythm:Regular Rate:Normal  Echo 2017 - Reviewed   Neuro/Psych  PSYCHIATRIC DISORDERS Anxiety Depression Bipolar Disorder    Neuromuscular disease CVA    GI/Hepatic Neg liver ROS,GERD  ,,  Endo/Other  negative endocrine ROS    Renal/GU negative Renal ROS     Musculoskeletal  (+) Arthritis ,  Fibromyalgia -  Abdominal   Peds  Hematology  (+) Blood dyscrasia, anemia   Anesthesia Other Findings   Reproductive/Obstetrics                             Anesthesia Physical Anesthesia Plan  ASA: 3  Anesthesia Plan: Spinal   Post-op Pain Management: Tylenol PO (pre-op)*   Induction: Intravenous  PONV Risk Score and Plan: 3 and Ondansetron, Dexamethasone and Midazolam  Airway Management Planned: Natural Airway  Additional Equipment: None  Intra-op Plan:   Post-operative Plan:   Informed Consent: I have reviewed the patients History and Physical, chart, labs and discussed the procedure including the risks, benefits and alternatives for the proposed anesthesia with the patient or authorized representative who has indicated his/her understanding and acceptance.       Plan Discussed with: CRNA  Anesthesia Plan Comments: (Lab Results      Component                Value               Date                      WBC                      6.7                 08/06/2022                HGB                      11.1 (L)             08/06/2022                HCT                      33.0 (L)            08/06/2022                MCV                      93.0                08/06/2022                PLT  233                 08/06/2022           )       Anesthesia Quick Evaluation

## 2022-10-09 NOTE — Transfer of Care (Signed)
Immediate Anesthesia Transfer of Care Note  Patient: Nicole Austin  Procedure(s) Performed: TOTAL HIP ARTHROPLASTY ANTERIOR APPROACH (Left: Hip)  Patient Location: PACU  Anesthesia Type:Spinal  Level of Consciousness: awake and alert   Airway & Oxygen Therapy: Patient Spontanous Breathing and Patient connected to face mask oxygen  Post-op Assessment: Report given to RN and Post -op Vital signs reviewed and stable  Post vital signs: Reviewed and stable  Last Vitals:  Vitals Value Taken Time  BP 147/71 10/09/22 1307  Temp    Pulse 70 10/09/22 1310  Resp 21 10/09/22 1310  SpO2 100 % 10/09/22 1310  Vitals shown include unfiled device data.  Last Pain:  Vitals:   10/09/22 0947  TempSrc: Oral         Complications: No notable events documented.

## 2022-10-09 NOTE — Anesthesia Procedure Notes (Signed)
Spinal  Start time: 10/09/2022 11:38 AM End time: 10/09/2022 11:40 AM Reason for block: surgical anesthesia Staffing Performed: anesthesiologist  Anesthesiologist: Shelton Silvas, MD Performed by: Shelton Silvas, MD Authorized by: Shelton Silvas, MD   Preanesthetic Checklist Completed: patient identified, IV checked, site marked, risks and benefits discussed, surgical consent, monitors and equipment checked, pre-op evaluation and timeout performed Spinal Block Patient position: sitting Prep: DuraPrep and site prepped and draped Location: L3-4 Injection technique: single-shot Needle Needle type: Pencan  Needle gauge: 24 G Needle length: 10 cm Needle insertion depth: 10 cm Additional Notes Patient tolerated well. No immediate complications.  Functioning IV was confirmed and monitors were applied. Sterile prep and drape, including hand hygiene and sterile gloves were used. The patient was positioned and the back was prepped. The skin was anesthetized with lidocaine. Free flow of clear CSF was obtained prior to injecting local anesthetic into the CSF. The spinal needle aspirated freely following injection. The needle was carefully withdrawn. The patient tolerated the procedure well.

## 2022-10-09 NOTE — Evaluation (Signed)
Physical Therapy Evaluation Patient Details Name: Nicole Austin MRN: [id number] DOB: 1960-09-05 Today's Date: 10/09/2022  History of Present Illness  62 yo female presents to therapy s/p L THA, anterior approach on 10/09/2022 due to failure of conservative measures. Pt PMH includes but is not limited to: anemia, sinus bradycardia, GAD, bipolar 1, substance abuse, fibromyalgia, HLD, LBP, COPD, OSA, asthma, gout, DOE, PD, PTSD, RLS, CVA with residual R UE weakness, B UE tremors, varicella, lumbar surgery, and L TKA (2026).  Clinical Impression    Nicole Austin is a 62 y.o. female POD 0 s/p L THA. Patient reports mod I with use of RW for mobility at baseline. Patient is now limited by functional impairments (see PT problem list below) and requires min A for bed mobility and min A for transfers. Patient was able to ambulate 18 feet with RW and CGA level of assist. Patient instructed in exercise to facilitate ROM and circulation to manage edema. Patient will benefit from continued skilled PT interventions to address impairments and progress towards PLOF. Acute PT will follow to progress mobility and stair training in preparation for safe discharge to sister's home with HEP.       If plan is discharge home, recommend the following: A little help with walking and/or transfers;A little help with bathing/dressing/bathroom;Assistance with cooking/housework;Assist for transportation;Help with stairs or ramp for entrance   Can travel by private vehicle        Equipment Recommendations None recommended by PT  Recommendations for Other Services       Functional Status Assessment Patient has had a recent decline in their functional status and demonstrates the ability to make significant improvements in function in a reasonable and predictable amount of time.     Precautions / Restrictions Precautions Precautions: Fall Restrictions Weight Bearing Restrictions: No      Mobility  Bed  Mobility Overal bed mobility: Needs Assistance Bed Mobility: Supine to Sit     Supine to sit: Min assist, HOB elevated, Used rails     General bed mobility comments: increased time cues and assist for LLE to EOB    Transfers Overall transfer level: Needs assistance Equipment used: Rolling walker (2 wheels) Transfers: Sit to/from Stand Sit to Stand: From elevated surface, Min assist           General transfer comment: cues for proper UE placement    Ambulation/Gait Ambulation/Gait assistance: Contact guard assist Gait Distance (Feet): 18 Feet Assistive device: Rolling walker (2 wheels) Gait Pattern/deviations: Step-to pattern, Antalgic, Trunk flexed Gait velocity: decreased     General Gait Details: pt indicated it felt better than she anticipated to walk, cues for posture and safety with turns and approach to recliner  Stairs            Wheelchair Mobility     Tilt Bed    Modified Rankin (Stroke Patients Only)       Balance Overall balance assessment: Needs assistance (no reported falls) Sitting-balance support: Feet supported, Bilateral upper extremity supported Sitting balance-Leahy Scale: Fair   Postural control: Posterior lean Standing balance support: Bilateral upper extremity supported, During functional activity, Reliant on assistive device for balance Standing balance-Leahy Scale: Poor                               Pertinent Vitals/Pain Pain Assessment Pain Assessment: Faces Faces Pain Scale: Hurts even more (pt unable to rate pain on numeric scale at time  of eval) Pain Location: L hip Pain Descriptors / Indicators: Constant, Discomfort, Operative site guarding Pain Intervention(s): Limited activity within patient's tolerance, Monitored during session, Premedicated before session, Repositioned, Ice applied    Home Living Family/patient expects to be discharged to:: Private residence Living Arrangements: Alone Available Help  at Discharge: Family Type of Home: House Home Access: Ramped entrance       Home Layout: One level Home Equipment: Agricultural consultant (2 wheels);Rollator (4 wheels);Cane - single point;Shower seat;Wheelchair - manual Additional Comments: pt will d/c to sister's home described as above.    Prior Function Prior Level of Function : Independent/Modified Independent             Mobility Comments: mod I with RW for household navigation mod I for ADLs, self care tasks ADLs Comments: pt lethargic at time of eval, suspect to medication and limited insight to PLOF  ins     Extremity/Trunk Assessment        Lower Extremity Assessment Lower Extremity Assessment: LLE deficits/detail LLE Deficits / Details: ankle DF/PF 4+/5 LLE Sensation: WNL    Cervical / Trunk Assessment Cervical / Trunk Assessment: Back Surgery (head forward)  Communication   Communication Communication: Difficulty following commands/understanding Following commands: Follows one step commands with increased time Cueing Techniques: Tactile cues;Visual cues;Gestural cues;Verbal cues  Cognition Arousal: Lethargic, Suspect due to medications Behavior During Therapy: Flat affect Overall Cognitive Status: Impaired/Different from baseline Area of Impairment: Following commands, Safety/judgement, Awareness                       Following Commands: Follows one step commands with increased time Safety/Judgement: Decreased awareness of safety, Decreased awareness of deficits     General Comments: cues for attention        General Comments      Exercises Total Joint Exercises Ankle Circles/Pumps: AROM, Both, 15 reps   Assessment/Plan    PT Assessment Patient needs continued PT services  PT Problem List Decreased strength;Decreased range of motion;Decreased activity tolerance;Decreased balance;Decreased mobility;Decreased coordination;Pain       PT Treatment Interventions DME instruction;Gait  training;Functional mobility training;Therapeutic exercise;Therapeutic activities;Balance training;Neuromuscular re-education;Patient/family education;Modalities    PT Goals (Current goals can be found in the Care Plan section)  Acute Rehab PT Goals PT Goal Formulation: Patient unable to participate in goal setting Time For Goal Achievement: 10/23/22 Potential to Achieve Goals: Good    Frequency 7X/week     Co-evaluation               AM-PAC PT "6 Clicks" Mobility  Outcome Measure Help needed turning from your back to your side while in a flat bed without using bedrails?: A Little Help needed moving from lying on your back to sitting on the side of a flat bed without using bedrails?: A Little Help needed moving to and from a bed to a chair (including a wheelchair)?: A Little Help needed standing up from a chair using your arms (e.g., wheelchair or bedside chair)?: A Little Help needed to walk in hospital room?: A Little Help needed climbing 3-5 steps with a railing? : Total 6 Click Score: 16    End of Session Equipment Utilized During Treatment: Gait belt Activity Tolerance: Patient limited by lethargy;Patient limited by pain Patient left: in chair;with call bell/phone within reach;with chair alarm set Nurse Communication: Mobility status (pain medication provided prior to PT eval) PT Visit Diagnosis: Unsteadiness on feet (R26.81);Other abnormalities of gait and mobility (R26.89);Muscle weakness (generalized) (M62.81);Difficulty in  walking, not elsewhere classified (R26.2);Pain Pain - Right/Left: Left Pain - part of body: Leg;Hip    Time: 1757-1820 PT Time Calculation (min) (ACUTE ONLY): 23 min   Charges:   PT Evaluation $PT Eval Low Complexity: 1 Low PT Treatments $Therapeutic Activity: 8-22 mins PT General Charges $$ ACUTE PT VISIT: 1 Visit         Johnny Bridge, PT Acute Rehab   Jacqualyn Posey 10/09/2022, 6:29 PM

## 2022-10-09 NOTE — Interval H&P Note (Signed)
History and Physical Interval Note:  10/09/2022 10:14 AM  Nicole Austin  has presented today for surgery, with the diagnosis of Left hip avascular necrosis.  The various methods of treatment have been discussed with the patient and family. After consideration of risks, benefits and other options for treatment, the patient has consented to  Procedure(s): TOTAL HIP ARTHROPLASTY ANTERIOR APPROACH (Left) as a surgical intervention.  The patient's history has been reviewed, patient examined, no change in status, stable for surgery.  I have reviewed the patient's chart and labs.  Questions were answered to the patient's satisfaction.     Shelda Pal

## 2022-10-09 NOTE — Op Note (Signed)
NAME:  Nicole Austin                ACCOUNT NO.: [id number]      MEDICAL RECORD NO.: [id number]      FACILITY:  [facility]      PHYSICIAN:  Shelda Pal  DATE OF BIRTH:  [birth year]     DATE OF PROCEDURE:  10/09/2022                                 OPERATIVE REPORT         PREOPERATIVE DIAGNOSIS: Left  hip osteoarthritis.      POSTOPERATIVE DIAGNOSIS:  Left hip osteoarthritis.      PROCEDURE:  Left total hip replacement through an anterior approach   utilizing DePuy THR system, component size 52 mm pinnacle cup, a size 36+4 neutral   Altrex liner, a size 4 Hi Actis stem with a 36+1.5 delta ceramic   ball.      SURGEON:  Madlyn Frankel. Charlann Boxer, M.D.      ASSISTANT:  Rosalene Billings, PA-C     ANESTHESIA:  Spinal.      SPECIMENS:  None.      COMPLICATIONS:  None.      BLOOD LOSS:  450 cc     DRAINS:  None.      INDICATION OF THE PROCEDURE:  Nicole Austin is a 62 y.o. female who had   presented to office for evaluation of left hip pain.  Radiographs revealed  Advanced avascular necrosis with significant femoral head collapse.  She had some evidence of degenerative changes associated with this pathology.  The pain in her left hip was significantly affecting her quality life and function.  Upon initial presentation she had a BMI over 40.  We had stressed the importance of trying to lose weight however based on her inability to function normally or beyond this proved to be a challenge.  At this point I felt that this needed to be addressed as an urgent matter based on the significant deformity of her femoral head.  We reviewed the risk of infection particularly as pertains to her BMI.  We reviewed the risks of infection DVT dislocation neurovascular injury and the need for future surgeries.  We discussed and reviewed the postoperative course and expectations.  Consent was obtained for the benefit of pain relief.     PROCEDURE IN DETAIL:  The patient was  brought to operative theater.   Once adequate anesthesia, preoperative antibiotics, 2 gm of Ancef, 1 gm of Tranexamic Acid, and 10 mg of Decadron were administered, the patient was positioned supine on the [employer] table.  Once the patient was safely positioned with adequate padding of boney prominences we predraped out the hip, and used fluoroscopy to confirm orientation of the pelvis.      The left hip was then prepped and draped from proximal iliac crest to   mid thigh with a shower curtain technique.      Time-out was performed identifying the patient, planned procedure, and the appropriate extremity.     An incision was then made 2 cm lateral to the   anterior superior iliac spine extending over the orientation of the   tensor fascia lata muscle and sharp dissection was carried down to the   fascia of the muscle.      The fascia was then incised.  The muscle belly  was identified and swept   laterally and retractor placed along the superior neck.  Following   cauterization of the circumflex vessels and removing some pericapsular   fat, a second cobra retractor was placed on the inferior neck.  A T-capsulotomy was made along the line of the   superior neck to the trochanteric fossa, then extended proximally and   distally.  Tag sutures were placed and the retractors were then placed   intracapsular.  We then identified the trochanteric fossa and   orientation of my neck cut and then made a neck osteotomy with the femur on traction.  The femoral   head was removed without difficulty or complication.  Traction was let   off and retractors were placed posterior and anterior around the   acetabulum.      The labrum and foveal tissue were debrided.  I began reaming with a 45 mm   reamer and reamed up to 51 mm reamer with good bony bed preparation and a 52 mm  cup was chosen.  The final 52 mm Pinnacle cup was then impacted under fluoroscopy to confirm the depth of penetration and orientation  with respect to   Abduction and forward flexion.  A screw was placed into the ilium followed by the hole eliminator.  The final   36+4 neutral Altrex liner was impacted with good visualized rim fit.  The cup was positioned anatomically within the acetabular portion of the pelvis.      At this point, the femur was rolled to 100 degrees.  Further capsule was   released off the inferior aspect of the femoral neck.  I then   released the superior capsule proximally.  With the leg in a neutral position the hook was placed laterally   along the femur under the vastus lateralis origin and elevated manually and then held in position using the hook attachment on the bed.  The leg was then extended and adducted with the leg rolled to 100   degrees of external rotation.  Retractors were placed along the medial calcar and posteriorly over the greater trochanter.  Once the proximal femur was fully   exposed, I used a box osteotome to set orientation.  I then began   broaching with the starting chili pepper broach and passed this by hand and then broached up to 4.  With the 4 broach in place I chose a high offset neck and did several trial reductions.  The offset was appropriate, leg lengths   appeared to be equal best matched with the +1.5 head ball trial confirmed radiographically.   Given these findings, I went ahead and dislocated the hip, repositioned all   retractors and positioned the right hip in the extended and abducted position.  The final 4 Hi Actis stem was   chosen and it was impacted down to the level of neck cut.  Based on this   and the trial reductions, a final 36+1.5 delta ceramic ball was chosen and   impacted onto a clean and dry trunnion, and the hip was reduced.  The   hip had been irrigated throughout the case again at this point.  I did   reapproximate the superior capsular leaflet to the anterior leaflet   using #1 Vicryl.  The fascia of the   tensor fascia lata muscle was then  reapproximated using #1 Vicryl and #0 Stratafix sutures.  The   remaining wound was closed with 2-0 Vicryl and running 4-0 Monocryl.  The hip was cleaned, dried, and dressed sterilely using Dermabond and   Aquacel dressing.  The patient was then brought   to recovery room in stable condition tolerating the procedure well.    Rosalene Billings, PA-C was present for the entirety of the case involved from   preoperative positioning, perioperative retractor management, general   facilitation of the case, as well as primary wound closure as assistant.            Madlyn Frankel Charlann Boxer, M.D.        10/09/2022 10:14 AM

## 2022-10-10 ENCOUNTER — Other Ambulatory Visit: Payer: Self-pay | Admitting: Family Medicine

## 2022-10-10 ENCOUNTER — Encounter (HOSPITAL_COMMUNITY): Payer: Self-pay | Admitting: Orthopedic Surgery

## 2022-10-10 DIAGNOSIS — M1612 Unilateral primary osteoarthritis, left hip: Secondary | ICD-10-CM | POA: Diagnosis not present

## 2022-10-10 DIAGNOSIS — M87852 Other osteonecrosis, left femur: Secondary | ICD-10-CM | POA: Diagnosis not present

## 2022-10-10 DIAGNOSIS — Z79899 Other long term (current) drug therapy: Secondary | ICD-10-CM | POA: Diagnosis not present

## 2022-10-10 DIAGNOSIS — Z96652 Presence of left artificial knee joint: Secondary | ICD-10-CM | POA: Diagnosis not present

## 2022-10-10 DIAGNOSIS — J449 Chronic obstructive pulmonary disease, unspecified: Secondary | ICD-10-CM | POA: Diagnosis not present

## 2022-10-10 DIAGNOSIS — J45909 Unspecified asthma, uncomplicated: Secondary | ICD-10-CM | POA: Diagnosis not present

## 2022-10-10 DIAGNOSIS — G20C Parkinsonism, unspecified: Secondary | ICD-10-CM | POA: Diagnosis not present

## 2022-10-10 DIAGNOSIS — Z8673 Personal history of transient ischemic attack (TIA), and cerebral infarction without residual deficits: Secondary | ICD-10-CM | POA: Diagnosis not present

## 2022-10-10 DIAGNOSIS — Z87891 Personal history of nicotine dependence: Secondary | ICD-10-CM | POA: Diagnosis not present

## 2022-10-10 MED ORDER — CEFADROXIL 500 MG PO CAPS: 500.0000 mg | ORAL_CAPSULE | Freq: Two times a day (BID) | ORAL | 0 refills | Status: AC

## 2022-10-10 MED ORDER — ASPIRIN 81 MG PO CHEW: 81.0000 mg | CHEWABLE_TABLET | Freq: Two times a day (BID) | ORAL | 0 refills | Status: AC

## 2022-10-10 MED ORDER — POLYETHYLENE GLYCOL 3350 17 G PO PACK: 17.0000 g | PACK | Freq: Two times a day (BID) | ORAL | 0 refills | Status: DC

## 2022-10-10 MED ORDER — NALOXONE HCL 4 MG/0.1ML NA LIQD: NASAL | 0 refills | Status: DC

## 2022-10-10 MED ORDER — OXYCODONE HCL 5 MG PO TABS: 10.0000 mg | ORAL_TABLET | ORAL | Status: DC | PRN

## 2022-10-10 MED ORDER — OXYCODONE HCL 5 MG PO TABS: 5.0000 mg | ORAL_TABLET | ORAL | 0 refills | Status: DC | PRN

## 2022-10-10 NOTE — Progress Notes (Signed)
Subjective: 1 Day Post-Op Procedure(s) (LRB): TOTAL HIP ARTHROPLASTY ANTERIOR APPROACH (Left) Patient reports pain as mild.   Patient seen in rounds for Dr. Charlann Boxer. Patient is resting in bed on exam this morning. She was using a vape when I entered the room. No acute events overnight. Foley catheter removed. Patient ambulated 18 feet with PT yesterday.  We will start therapy today.   Objective: Vital signs in last 24 hours: Temp:  [97.5 F (36.4 C)-98.4 F (36.9 C)] 97.9 F (36.6 C) (08/14 0650) Pulse Rate:  [65-97] 97 (08/14 0650) Resp:  [11-18] 18 (08/14 0650) BP: (100-173)/(56-99) 110/56 (08/14 0650) SpO2:  [97 %-100 %] 99 % (08/14 0650) Weight:  [102.5 kg] 102.5 kg (08/13 0928)  Intake/Output from previous day:  Intake/Output Summary (Last 24 hours) at 10/10/2022 0756 Last data filed at 10/10/2022 0650 Gross per 24 hour  Intake 3321.68 ml  Output 2900 ml  Net 421.68 ml     Intake/Output this shift: No intake/output data recorded.  Labs: Recent Labs    10/09/22 0923 10/10/22 0328  HGB 11.8* 8.9*   Recent Labs    10/09/22 0923 10/10/22 0328  WBC 6.8 10.2  RBC 3.84* 2.89*  HCT 36.1 27.5*  PLT 254 242   Recent Labs    10/09/22 0958 10/10/22 0328  NA 129* 131*  K 3.7 4.7  CL 93* 96*  CO2 26 27  BUN 8 11  CREATININE 0.87 0.76  GLUCOSE 82 134*  CALCIUM 9.1 8.6*   No results for input(s): "LABPT", "INR" in the last 72 hours.  Exam: General - Patient is Alert and Oriented Extremity - Neurologically intact Sensation intact distally Intact pulses distally Dorsiflexion/Plantar flexion intact Dressing - dressing C/D/I Motor Function - intact, moving foot and toes well on exam.   Past Medical History:  Diagnosis Date   ADHD (attention deficit hyperactivity disorder)    Apnea, sleep 06/30/2015   Arthritis    Asthma    Asthma with acute exacerbation 06/30/2015   Benign neoplasm of ascending colon    Bipolar 1 disorder (HCC)    Chronic pain     COPD, moderate (HCC) 11/18/2013   Depression    DOE (dyspnea on exertion)    Fall 07/04/2022   GERD (gastroesophageal reflux disease) 08/30/2015   Gout 08/16/2014   History of acute myocardial infarction 06/30/2015   History of panic attacks    HPV (human papilloma virus) infection 07/17/2013   Hyperlipidemia    Left knee pain 07/06/2022   Low HDL (under 40) 08/30/2015   MI (myocardial infarction) (HCC) 2018   MRSA infection 2023   groin abscess   Nose colonized with MRSA 03/21/2022   a.) PCR (+) prior to LEFT THA   Osteoporosis    Parkinson disease    Polyp of sigmoid colon    Pre-diabetes    Prediabetes 05/20/2016   PTSD (post-traumatic stress disorder)    Restless leg syndrome    Rhabdomyolysis    Sleep apnea 06/30/2015   Stage 3 severe COPD by GOLD classification (HCC) 11/18/2013   Stroke (HCC) 2018   right arm weakness   Traumatic rhabdomyolysis (HCC) 07/04/2022   Tremors of nervous system    hands   Uncomplicated asthma 06/30/2015   Varicella 02/25/2017    Assessment/Plan: 1 Day Post-Op Procedure(s) (LRB): TOTAL HIP ARTHROPLASTY ANTERIOR APPROACH (Left) Principal Problem:   S/P total left hip arthroplasty  Estimated body mass index is 42.7 kg/m as calculated from the following:   Height  as of this encounter: 5\' 1"  (1.549 m).   Weight as of this encounter: 102.5 kg. Advance diet Up with therapy D/C IV fluids  DVT Prophylaxis - Aspirin Weight bearing as tolerated.  Hgb stable at 8.9 this AM.  Patient's PCP informed me that she is not in any form of pain management currently. I talked with the patient about this. We normally provide pain medication for 3-4 weeks post op, and then if she requires it afterwards she would need to establish with a pain mgmt provider. I also educated her on being careful with xanax and pain medication at the same time as this increases risk of overdose. She expressed understanding of this.   Plan is to go Home after hospital  stay. Plan for discharge today after meeting goals with therapy. Follow up in the office in 2 weeks.   Rosalene Billings, PA-C Orthopedic Surgery [phone] 10/10/2022, 7:56 AM

## 2022-10-10 NOTE — TOC Transition Note (Signed)
Transition of Care [facility]) - CM/SW Discharge Note   Patient Details  Name: Nicole Austin MRN: [id number] Date of Birth: Oct 04, 1960  [facility] CM/SW Contact:  Amada Jupiter, LCSW Phone Number: 10/10/2022, 9:29 AM   Clinical Narrative:     Met with pt who reports she does NOT have needed DME at home.  Needs youth rolling walker and no DME agency preference - referral placed with [employer] for delivery to room.  Plan for HEP. No further TOC needs.  Final next level of care: Home/Self Care Barriers to Discharge: No Barriers Identified   Patient Goals and CMS Choice      Discharge Placement                         Discharge Plan and Services Additional resources added to the After Visit Summary for                  DME Arranged: Walker rolling (youth) DME Agency: [employer] Date DME Agency Contacted: 10/10/22 Time DME Agency Contacted: [phone] Representative spoke with at DME Agency: Loraine Leriche HH Arranged: NA HH Agency: NA        Social Determinants of Health (SDOH) Interventions SDOH Screenings   Food Insecurity: No Food Insecurity (10/09/2022)  Housing: Low Risk  (10/09/2022)  Transportation Needs: No Transportation Needs (10/09/2022)  Utilities: Not At Risk (10/09/2022)  Alcohol Screen: Low Risk  (03/21/2022)  Depression (PHQ2-9): Low Risk  (09/10/2022)  Recent Concern: Depression (PHQ2-9) - Medium Risk (07/09/2022)  Financial Resource Strain: Low Risk  (04/25/2020)  Recent Concern: Financial Resource Strain - Medium Risk (03/17/2020)  Physical Activity: Inactive (03/21/2021)  Social Connections: Socially Isolated (03/21/2021)  Stress: Stress Concern Present (03/21/2021)  Tobacco Use: Medium Risk (10/09/2022)     Readmission Risk Interventions     No data to display

## 2022-10-10 NOTE — Progress Notes (Signed)
Patient discharged to home w/ family. Given all belongings, instructions, equipment. Verbalized understanding of all instructions. Escorted to pov via w/c. 

## 2022-10-10 NOTE — Progress Notes (Signed)
Physical Therapy Treatment Patient Details Name: Nicole Austin MRN: [id number] DOB: 1960-05-01 Today's Date: 10/10/2022   History of Present Illness 62 yo female presents to therapy s/p L THA, anterior approach on 10/09/2022 due to failure of conservative measures. Pt PMH includes but is not limited to: anemia, sinus bradycardia, GAD, bipolar 1, substance abuse, fibromyalgia, HLD, LBP, COPD, OSA, asthma, gout, DOE, PD, PTSD, RLS, CVA with residual R UE weakness, B UE tremors, varicella, lumbar surgery, and L TKA (2026).    PT Comments  Pt is progressing well this session, meeting goals; reviewed HEP and progression, pt verbalizes understanding, she is motivated to d/c home this morning. Pt reports her sister is having surgery and states her brother in law will be taking her home. RN updated.    If plan is discharge home, recommend the following: A little help with walking and/or transfers;A little help with bathing/dressing/bathroom;Assistance with cooking/housework;Assist for transportation;Help with stairs or ramp for entrance   Can travel by private vehicle        Equipment Recommendations  None recommended by PT    Recommendations for Other Services       Precautions / Restrictions Precautions Precautions: Fall Restrictions Weight Bearing Restrictions: No     Mobility  Bed Mobility Overal bed mobility: Needs Assistance Bed Mobility: Supine to Sit     Supine to sit: Supervision     General bed mobility comments: increased time cues to self assist for LLE to EOB    Transfers Overall transfer level: Needs assistance Equipment used: Rolling walker (2 wheels) Transfers: Sit to/from Stand Sit to Stand: Contact guard assist, Supervision           General transfer comment: cues for proper UE placement    Ambulation/Gait Ambulation/Gait assistance: Supervision, Contact guard assist Gait Distance (Feet): 120 Feet Assistive device: Rolling walker (2 wheels) Gait  Pattern/deviations: Step-through pattern       General Gait Details: cues for gait progression and RW position. good stability, no LOB   Stairs             Wheelchair Mobility     Tilt Bed    Modified Rankin (Stroke Patients Only)       Balance Overall balance assessment:  (denies falls) Sitting-balance support: Feet supported, No upper extremity supported Sitting balance-Leahy Scale: Good     Standing balance support: Reliant on assistive device for balance, During functional activity Standing balance-Leahy Scale: Fair                              Cognition Arousal: Alert Behavior During Therapy: WFL for tasks assessed/performed Overall Cognitive Status: Within Functional Limits for tasks assessed                                          Exercises      General Comments        Pertinent Vitals/Pain Pain Assessment Pain Assessment: Faces Faces Pain Scale: Hurts little more Pain Location: L hip Pain Descriptors / Indicators: Discomfort, Operative site guarding Pain Intervention(s): Limited activity within patient's tolerance, Monitored during session, Premedicated before session, Repositioned, Ice applied    Home Living                          Prior Function  PT Goals (current goals can now be found in the care plan section) Acute Rehab PT Goals PT Goal Formulation: Patient unable to participate in goal setting Time For Goal Achievement: 10/23/22 Potential to Achieve Goals: Good Progress towards PT goals: Progressing toward goals    Frequency    7X/week      PT Plan      Co-evaluation              AM-PAC PT "6 Clicks" Mobility   Outcome Measure  Help needed turning from your back to your side while in a flat bed without using bedrails?: None Help needed moving from lying on your back to sitting on the side of a flat bed without using bedrails?: A Little Help needed moving to  and from a bed to a chair (including a wheelchair)?: A Little Help needed standing up from a chair using your arms (e.g., wheelchair or bedside chair)?: A Little Help needed to walk in hospital room?: A Little Help needed climbing 3-5 steps with a railing? : A Little 6 Click Score: 19    End of Session Equipment Utilized During Treatment: Gait belt Activity Tolerance: Patient tolerated treatment well Patient left: in chair;with call bell/phone within reach;with chair alarm set Nurse Communication: Mobility status PT Visit Diagnosis: Unsteadiness on feet (R26.81);Other abnormalities of gait and mobility (R26.89);Muscle weakness (generalized) (M62.81);Difficulty in walking, not elsewhere classified (R26.2);Pain Pain - Right/Left: Left Pain - part of body: Leg;Hip     Time: 4098-1191 PT Time Calculation (min) (ACUTE ONLY): 23 min  Charges:    $Gait Training: 23-37 mins PT General Charges $$ ACUTE PT VISIT: 1 Visit                     , PT  Acute Rehab Dept [facility]) [phone]  10/10/2022    [facility] 10/10/2022, 10:40 AM

## 2022-10-10 NOTE — Anesthesia Postprocedure Evaluation (Signed)
Anesthesia Post Note  Patient: Nicole Austin  Procedure(s) Performed: TOTAL HIP ARTHROPLASTY ANTERIOR APPROACH (Left: Hip)     Patient location during evaluation: PACU Anesthesia Type: Spinal Level of consciousness: oriented and awake and alert Pain management: pain level controlled Vital Signs Assessment: post-procedure vital signs reviewed and stable Respiratory status: spontaneous breathing, respiratory function stable and patient connected to nasal cannula oxygen Cardiovascular status: blood pressure returned to baseline and stable Postop Assessment: no headache, no backache and no apparent nausea or vomiting Anesthetic complications: no   No notable events documented.            Shelton Silvas

## 2022-10-14 DIAGNOSIS — M87059 Idiopathic aseptic necrosis of unspecified femur: Secondary | ICD-10-CM | POA: Diagnosis not present

## 2022-10-19 ENCOUNTER — Telehealth: Payer: Self-pay | Admitting: Family Medicine

## 2022-10-19 NOTE — Telephone Encounter (Signed)
She has not discussed with PCP. Pt needs appt

## 2022-10-19 NOTE — Telephone Encounter (Signed)
Copied from CRM [id number]. Topic: Referral - Request for Referral >> Oct 19, 2022 10:11 AM [name] wrote: Has patient seen PCP for this complaint? Yes.   *If NO, is insurance requiring patient see PCP for this issue before PCP can refer them? Referral for which specialty: Neurology  Preferred provider/office: Patient has no preference  Reason for referral: Patient has Parkinson's Disease Concerns

## 2022-10-19 NOTE — Telephone Encounter (Signed)
Virtual appt scheduled wit Nicole Austin for Sept 3rd at 2pm

## 2022-10-20 NOTE — Discharge Summary (Signed)
Patient ID: Nicole Austin MRN: [id number] DOB/AGE: 1960/10/21 62 y.o.  Admit date: 10/09/2022 Discharge date: 10/10/2022  Admission Diagnoses:  Left hip avascular necrosis  Discharge Diagnoses:  Principal Problem:   S/P total left hip arthroplasty   Past Medical History:  Diagnosis Date   ADHD (attention deficit hyperactivity disorder)    Apnea, sleep 06/30/2015   Arthritis    Asthma    Asthma with acute exacerbation 06/30/2015   Benign neoplasm of ascending colon    Bipolar 1 disorder (HCC)    Chronic pain    COPD, moderate (HCC) 11/18/2013   Depression    DOE (dyspnea on exertion)    Fall 07/04/2022   GERD (gastroesophageal reflux disease) 08/30/2015   Gout 08/16/2014   History of acute myocardial infarction 06/30/2015   History of panic attacks    HPV (human papilloma virus) infection 07/17/2013   Hyperlipidemia    Left knee pain 07/06/2022   Low HDL (under 40) 08/30/2015   MI (myocardial infarction) (HCC) 2018   MRSA infection 2023   groin abscess   Nose colonized with MRSA 03/21/2022   a.) PCR (+) prior to LEFT THA   Osteoporosis    Parkinson disease    Polyp of sigmoid colon    Pre-diabetes    Prediabetes 05/20/2016   PTSD (post-traumatic stress disorder)    Restless leg syndrome    Rhabdomyolysis    Sleep apnea 06/30/2015   Stage 3 severe COPD by GOLD classification (HCC) 11/18/2013   Stroke (HCC) 2018   right arm weakness   Traumatic rhabdomyolysis (HCC) 07/04/2022   Tremors of nervous system    hands   Uncomplicated asthma 06/30/2015   Varicella 02/25/2017    Surgeries: Procedure(s): TOTAL HIP ARTHROPLASTY ANTERIOR APPROACH on 10/09/2022   Consultants:   Discharged Condition: Improved  Hospital Course: Nicole Austin is an 63 y.o. female who was admitted 10/09/2022 for operative treatment ofS/P total left hip arthroplasty. Patient has severe unremitting pain that affects sleep, daily activities, and work/hobbies. After pre-op  clearance the patient was taken to the operating room on 10/09/2022 and underwent  Procedure(s): TOTAL HIP ARTHROPLASTY ANTERIOR APPROACH.    Patient was given perioperative antibiotics:  Anti-infectives (From admission, onward)    Start     Dose/Rate Route Frequency Ordered Stop   10/10/22 0000  cefadroxil (DURICEF) 500 MG capsule        500 mg Oral 2 times daily 10/10/22 0803 10/17/22 2359   10/09/22 1730  ceFAZolin (ANCEF) IVPB 2g/100 mL premix        2 g 200 mL/hr over 30 Minutes Intravenous Every 6 hours 10/09/22 1633 10/10/22 0033   10/09/22 1247  vancomycin (VANCOCIN) powder  Status:  Discontinued          As needed 10/09/22 1247 10/09/22 1620   10/09/22 0930  ceFAZolin (ANCEF) IVPB 2g/100 mL premix        2 g 200 mL/hr over 30 Minutes Intravenous On call to O.R. 10/09/22 0922 10/09/22 1153   10/09/22 0930  vancomycin (VANCOREADY) IVPB 1500 mg/300 mL        1,500 mg 150 mL/hr over 120 Minutes Intravenous On call to O.R. 10/09/22 8657 10/09/22 1449        Patient was given sequential compression devices, early ambulation, and chemoprophylaxis to prevent DVT. Patient worked with PT and was meeting their goals regarding safe ambulation and transfers.  Patient benefited maximally from hospital stay and there were no complications.    Recent  vital signs: No data found.   Recent laboratory studies: No results for input(s): "WBC", "HGB", "HCT", "PLT", "NA", "K", "CL", "CO2", "BUN", "CREATININE", "GLUCOSE", "INR", "CALCIUM" in the last 72 hours.  Invalid input(s): "PT", "2"   Discharge Medications:   Allergies as of 10/10/2022       Reactions   Chantix [varenicline] Nausea Only   Glycopyrrolate Rash   Oral irritation        Medication List     STOP taking these medications    albuterol 108 (90 Base) MCG/ACT inhaler Commonly known as: VENTOLIN HFA   cyclobenzaprine 5 MG tablet Commonly known as: FLEXERIL   diclofenac 50 MG EC tablet Commonly known as: VOLTAREN    potassium chloride SA 20 MEQ tablet Commonly known as: KLOR-CON M   Spiriva Respimat 2.5 MCG/ACT Aers Generic drug: Tiotropium Bromide Monohydrate   tiZANidine 4 MG tablet Commonly known as: ZANAFLEX   traMADol 50 MG tablet Commonly known as: ULTRAM       TAKE these medications    acetaminophen 500 MG tablet Commonly known as: TYLENOL Take 2 tablets (1,000 mg total) by mouth every 8 (eight) hours as needed for moderate pain, mild pain, fever or headache.   ALPRAZolam 0.5 MG tablet Commonly known as: XANAX Take 1 tablet (0.5 mg total) by mouth 2 (two) times daily.   aspirin 81 MG chewable tablet Chew 1 tablet (81 mg total) by mouth 2 (two) times daily for 28 days.   chlorhexidine 4 % external liquid Commonly known as: HIBICLENS Apply 15 mLs (1 Application total) topically as directed for 30 doses. Use as directed daily for 5 days every other week for 6 weeks.   divalproex 250 MG DR tablet Commonly known as: DEPAKOTE Take 1 tablet (250 mg total) by mouth 2 (two) times daily.   escitalopram 20 MG tablet Commonly known as: LEXAPRO Take 20 mg by mouth daily.   furosemide 20 MG tablet Commonly known as: LASIX Take 0.5-1 tablets (10-20 mg total) by mouth daily as needed for fluid or edema. Take in am with potassium PRN   Lurasidone HCl 60 MG Tabs Commonly known as: Latuda Take 1 tablet (60 mg total) by mouth daily with breakfast.   multivitamin with minerals tablet Take 1 tablet by mouth daily.   mupirocin ointment 2 % Commonly known as: BACTROBAN Place 1 Application into the nose 2 (two) times daily for 60 doses. Use as directed 2 times daily for 5 days every other week for 6 weeks.   naloxone 4 MG/0.1ML Liqd nasal spray kit Commonly known as: NARCAN Spray into nostril with signs of opioid related oversedation or overdose   nystatin powder Commonly known as: MYCOSTATIN/NYSTOP Apply 1 Application topically 3 (three) times daily. What changed:  when to take  this reasons to take this   oxyCODONE 5 MG immediate release tablet Commonly known as: Oxy IR/ROXICODONE Take 1 tablet (5 mg total) by mouth every 4 (four) hours as needed for severe pain. What changed:  when to take this reasons to take this   polyethylene glycol 17 g packet Commonly known as: MIRALAX / GLYCOLAX Take 17 g by mouth 2 (two) times daily. What changed: when to take this   pregabalin 25 MG capsule Commonly known as: LYRICA Take 1-2 capsules (25-50 mg total) by mouth 2 (two) times daily. What changed: how much to take   rOPINIRole 4 MG 24 hr tablet Commonly known as: REQUIP XL Take 4 mg by mouth at bedtime.  senna-docusate 8.6-50 MG tablet Commonly known as: Senokot-S Take 1 tablet by mouth 2 (two) times daily.       ASK your doctor about these medications    cefadroxil 500 MG capsule Commonly known as: DURICEF Take 1 capsule (500 mg total) by mouth 2 (two) times daily for 7 days. Ask about: Should I take this medication?               Discharge Care Instructions  (From admission, onward)           Start     Ordered   10/10/22 0000  Change dressing       Comments: Maintain surgical dressing until follow up in the clinic. If the edges start to pull up, may reinforce with tape. If the dressing is no longer working, may remove and cover with gauze and tape, but must keep the area dry and clean.  Call with any questions or concerns.   10/10/22 0803            Diagnostic Studies: DG Pelvis Portable  Result Date: 10/09/2022 CLINICAL DATA:  Postop in PACU EXAM: PORTABLE PELVIS 1-2 VIEWS COMPARISON:  Preoperative imaging FINDINGS: Left hip arthroplasty in expected alignment. No periprosthetic lucency or fracture. Recent postsurgical change includes air and edema in the soft tissues. Known right hip avascular necrosis not well delineated on the current exam. IMPRESSION: Left hip arthroplasty without immediate postoperative complication.  Electronically Signed   By: Narda Rutherford M.D.   On: 10/09/2022 16:30   DG HIP UNILAT WITH PELVIS 1V LEFT  Result Date: 10/09/2022 CLINICAL DATA:  Elective surgery. EXAM: DG HIP (WITH OR WITHOUT PELVIS) 1V*L* COMPARISON:  Preoperative imaging. FINDINGS: Two fluoroscopic spot views of the pelvis and left hip obtained in the operating room. Images during hip arthroplasty. Fluoroscopy time 9 seconds. Dose 1.609 mGy. IMPRESSION: Intraoperative fluoroscopy for left hip arthroplasty. Electronically Signed   By: Narda Rutherford M.D.   On: 10/09/2022 16:29   DG C-Arm 1-60 Min-No Report  Result Date: 10/09/2022 Fluoroscopy was utilized by the requesting physician.  No radiographic interpretation.   DG C-Arm 1-60 Min-No Report  Result Date: 10/09/2022 Fluoroscopy was utilized by the requesting physician.  No radiographic interpretation.    Disposition: Discharge disposition: 01-Home or Self Care       Discharge Instructions     Call MD / Call 911   Complete by: As directed    If you experience chest pain or shortness of breath, CALL 911 and be transported to the hospital emergency room.  If you develope a fever above 101 F, pus (white drainage) or increased drainage or redness at the wound, or calf pain, call your surgeon's office.   Change dressing   Complete by: As directed    Maintain surgical dressing until follow up in the clinic. If the edges start to pull up, may reinforce with tape. If the dressing is no longer working, may remove and cover with gauze and tape, but must keep the area dry and clean.  Call with any questions or concerns.   Constipation Prevention   Complete by: As directed    Drink plenty of fluids.  Prune juice may be helpful.  You may use a stool softener, such as Colace (over the counter) 100 mg twice a day.  Use MiraLax (over the counter) for constipation as needed.   Diet - low sodium heart healthy   Complete by: As directed    Increase activity slowly as  tolerated   Complete by: As directed    Weight bearing as tolerated with assist device (walker, cane, etc) as directed, use it as long as suggested by your surgeon or therapist, typically at least 4-6 weeks.   Post-operative opioid taper instructions:   Complete by: As directed    POST-OPERATIVE OPIOID TAPER INSTRUCTIONS: It is important to wean off of your opioid medication as soon as possible. If you do not need pain medication after your surgery it is ok to stop day one. Opioids include: Codeine, Hydrocodone(Norco, Vicodin), Oxycodone(Percocet, oxycontin) and hydromorphone amongst others.  Long term and even short term use of opiods can cause: Increased pain response Dependence Constipation Depression Respiratory depression And more.  Withdrawal symptoms can include Flu like symptoms Nausea, vomiting And more Techniques to manage these symptoms Hydrate well Eat regular healthy meals Stay active Use relaxation techniques(deep breathing, meditating, yoga) Do Not substitute Alcohol to help with tapering If you have been on opioids for less than two weeks and do not have pain than it is ok to stop all together.  Plan to wean off of opioids This plan should start within one week post op of your joint replacement. Maintain the same interval or time between taking each dose and first decrease the dose.  Cut the total daily intake of opioids by one tablet each day Next start to increase the time between doses. The last dose that should be eliminated is the evening dose.      TED hose   Complete by: As directed    Use stockings (TED hose) for 2 weeks on both leg(s).  You may remove them at night for sleeping.        Follow-up Information     Cassandria Anger, PA-C. Go on 10/24/2022.   Specialty: Orthopedic Surgery Why: You are scheduled for a follow up appointment on 10-24-22 at 4:00 pm. Contact information: [address] [place] [address] [phone]                   Signed: Cassandria Anger 10/20/2022, 9:25 AM

## 2022-10-28 DIAGNOSIS — M87059 Idiopathic aseptic necrosis of unspecified femur: Secondary | ICD-10-CM | POA: Diagnosis not present

## 2022-10-30 ENCOUNTER — Telehealth (INDEPENDENT_AMBULATORY_CARE_PROVIDER_SITE_OTHER): Payer: 59 | Admitting: Family Medicine

## 2022-10-30 DIAGNOSIS — Z79899 Other long term (current) drug therapy: Secondary | ICD-10-CM

## 2022-10-30 DIAGNOSIS — R251 Tremor, unspecified: Secondary | ICD-10-CM

## 2022-10-30 DIAGNOSIS — Z820 Family history of epilepsy and other diseases of the nervous system: Secondary | ICD-10-CM | POA: Diagnosis not present

## 2022-10-30 NOTE — Progress Notes (Signed)
Name: Nicole Austin   MRN: [id number]    DOB: 1960-10-11   Date:10/30/2022       Progress Note  Subjective:    Chief Complaint  Chief Complaint  Patient presents with   Referral    Neurology- to get examined for parkinson disease    I connected with  Nicole Austin on 10/30/22 at  2:00 PM EDT by telephone and verified that I am speaking with the correct person using two identifiers.   I discussed the limitations, risks, security and privacy concerns of performing an evaluation and management service by telephone and the availability of in person appointments. Staff also discussed with the patient that there may be a patient responsible charge related to this service.  Patient verbalized understanding and agreed to proceed with encounter. Patient Location: home Provider Location: [facility] clinic office  Additional Individuals present: none  HPI Pt would like to get established with neurology again for tremors/shakes and reported hx of parkinsons - she did consult in 2019, followed with them through early 2021 with the last several OV virtual due to covid Looks like they did put her on requip and started and then stopped sinemet Extensive chart review   Last OV note 03/11/2019:  Disease Summary: (Aggregate of information from previous visits)   Tremors - multifactorial concerned for drug induced tremors in a patient long standing exposure to Seroquel +Depakote and Latuda and chronic use of benzos vs Parkinson's disease with multiple features including unilateral resting tremors + feeling of stiffness + slowness in movements + drooling + hypophonia + constipation + vivid dreams + imbalance + transient visual hallucinations + bilateral minimal bradykinesia + minimal cog wheeling right > left + family history of Parkinson's disease in father: Patient states that she has been having trouble with her balance for awhile now. She does not know exactly how long balance issues have been  occurring. She states she will be walking and all the sudden lose her balance and go to one side or the other. States she has fallen in the past and sometimes uses a cane when walking because she is afraid of losing her balance. She states that at home she uses non-slip socks or shoes when walking around her apartment. Balance problems are not constant. Feels "weaker" some days.   Patient is also experiencing tremors in her hands and legs bilaterally. Patient states tremors started in April 2019. Patient states tremors came on suddenly. They worsen with certain tasks and with increased anxiety and nervousness. Nothing seems to make her symptoms better. Patient admits to drooling, some constipation, vivid dreams at night, and stiffness and slowness of her movements. She has noticed a decrease in the volume of her speech. Tremor right > left Mostly comes at rest rather than action. Patient has a family history of Parkinson's disease in her father.   Medication: Requip (On 4 mg regular release she endorsed nausea), carbidopa-levodopa 25/100  MRI brain without contrast 07/18/18: Negative brain MRI.  Headaches: Patient has been having headaches for the past few months. Has had a headache everyday for the last week and a half. States she does have a lot of stress in her life. Headaches are bifrontal. Positive for photophobia and phonophobia. States she sometimes has nausea with these headaches. Describes it as "annoying". Makes her want to be in complete darkness. When she has a headache she will take ibuprofen and that helps the headache some. Patient states she was in the hospital recently and  was told she had suffered a heart attack. Headaches may have been present for a year, but patient is not sure. Per patient tramadol helps with knee and back pain.   Medication: gabapentin( not effective and caused weight gain)  Anxiety and Bipolar disorder: Patient was diagnosed in her late 20's/early 30's. Managed by  psychiatry. Per patient she has no history of drug abuse. Does not consume alcohol.   Medications: Flexeril, Xanax, Latuda   Vision Problems: Patient states that she got new glasses less than a year ago and has trouble seeing. She states she has an appointment coming up with her eye doctor but feels that something else may be going on. She states she will be watching TV and her eyes will go blurry. She will rub her eyes and clean her glasses but nothing helps.   Snoring concerning for obstructive sleep apnea:   Per patient history of stroke in 2016: affecting the right side of the body.  MRI brain and CT head both negative.  History of heart attack in 2016.  Patient has history of stroke per patient with negative MRI and CT: happened in 2016 affecting right side of body   Interim History:   Ms. Bastien is a 63 y.o. female here for treatment and evaluation of Tremors,.   Ms. Jovic last visit was on 10/24/18  Patient states her tremors are unchanged. She takes Sinemet 25/100 mg 0.5 tablet three times a day. She was unable to tell significant improvement with her tremors with the start of sinemet. Patient reports feet in her calves and feet.   Ms. Azbill denied decreased volume of speech, drooling, feeling of stiffness, slowness of the movements, imbalance, constipation, vivid dreams at night or acting out of the dreams etc at initial visit.  A&P: 1. Tremors - multifactorial concerned for drug induced tremors in a patient long standing exposure to Seroquel +Depakote and Latuda and chronic use of benzos vs Parkinson's disease with multiple features including unilateral resting tremors + feeling of stiffness + slowness in movements + drooling + hypophonia + constipation + vivid dreams + imbalance + transient visual hallucinations + bilateral minimal bradykinesia + minimal cog wheeling right > left: Still symptomatic   - Continue Ropinirole XL 2 mg nightly.  Patient did not see significant  improvement with her Restless Leg Syndrome after being changed to Requip XL  - Discontinued Sinemet 25/100 mg 0.5 tablet three times a day due to excessive nausea even when taking with food   -Start amantadine 100 mg two times a day (every 12 hours). You may start off with half tablet two times a day (every 12 hours) due to concerns of side effects. Amantadine (Symmetrel) is an anti-influenza medication that has benefits in Parkinson's disease . It is used as a adjunct medication in Parkinson's disease to control tremors and Levodopa-induced dyskinesia. Usually starting dose if 50 mg two times a day but can increase to 100 mg 2-3 times daily. Common side effects are nausea, dry mouth, lightheadedness, insomnia, confusion and hallucination.  Patient with Restless Leg Syndrome should get CBC, BMP, Magnesium, TSH, Vit B12, ferritin, iron panel.  2. Vitamin B12 deficiency - Patient is taking oral supplements daily   3. History of anxiety and depression, continue to follow-up with outside provider     Patient Active Problem List   Diagnosis Date Noted   S/P total left hip arthroplasty 10/09/2022   Hyponatremia 07/09/2022   Iron deficiency anemia 07/07/2022   History of methicillin resistant  staphylococcus aureus (MRSA) 05/03/2022   Avascular necrosis of bones of both hips (HCC) 05/03/2022   Chronic prescription benzodiazepine use 05/03/2022   Polypharmacy 04/09/2022   Sinus bradycardia 04/09/2022   Depression with anxiety 04/09/2022   MRSA (methicillin resistant staph aureus) culture positive 05/04/2021   Prolonged QT interval 01/01/2021   Bipolar disorder with depression (HCC) 02/25/2017   ADD (attention deficit disorder) 02/25/2017   Marijuana use 02/13/2017   Neuropathy, peripheral 05/07/2016   Vitamin B12 deficiency 10/24/2015   Fibromyalgia 08/30/2015   OP (osteoporosis) 06/30/2015   Sleep apnea 06/30/2015   HLD (hyperlipidemia) 05/13/2015   Obesity, Class III, BMI 40-49.9  (morbid obesity) (HCC) 08/16/2014   Low back pain with sciatica 08/04/2014   Polysubstance abuse (HCC) 07/04/2014   Anxiety, generalized 11/24/2013   COPD (chronic obstructive pulmonary disease) (HCC) 11/18/2013   Osteoarthritis of knee, unspecified 07/15/2013    Social History   Tobacco Use   Smoking status: Former    Current packs/day: 0.00    Average packs/day: 0.5 packs/day for 36.0 years (18.0 ttl pk-yrs)    Types: Cigarettes    Start date: 03/13/1986    Quit date: 03/13/2022    Years since quitting: 0.6    Passive exposure: Past   Smokeless tobacco: Never   Tobacco comments:    Pt using nicotine patches; 1/2 PPD as of 03/21/21  Substance Use Topics   Alcohol use: No    Alcohol/week: 0.0 standard drinks of alcohol     Current Outpatient Medications:    ALPRAZolam (XANAX) 0.5 MG tablet, Take 1 tablet (0.5 mg total) by mouth 2 (two) times daily., Disp: 10 tablet, Rfl: 0   aspirin 81 MG chewable tablet, Chew 1 tablet (81 mg total) by mouth 2 (two) times daily for 28 days., Disp: 56 tablet, Rfl: 0   cefadroxil (DURICEF) 500 MG capsule, Take 500 mg by mouth 2 (two) times daily., Disp: , Rfl:    chlorhexidine (HIBICLENS) 4 % external liquid, Apply 15 mLs (1 Application total) topically as directed for 30 doses. Use as directed daily for 5 days every other week for 6 weeks., Disp: 946 mL, Rfl: 1   divalproex (DEPAKOTE) 250 MG DR tablet, Take 1 tablet (250 mg total) by mouth 2 (two) times daily., Disp: 60 tablet, Rfl: 2   escitalopram (LEXAPRO) 20 MG tablet, Take 20 mg by mouth daily., Disp: , Rfl:    furosemide (LASIX) 20 MG tablet, Take 0.5-1 tablets (10-20 mg total) by mouth daily as needed for fluid or edema. Take in am with potassium PRN, Disp: 5 tablet, Rfl: 0   Lurasidone HCl (LATUDA) 60 MG TABS, Take 1 tablet (60 mg total) by mouth daily with breakfast., Disp: 30 tablet, Rfl: 0   Multiple Vitamins-Minerals (MULTIVITAMIN WITH MINERALS) tablet, Take 1 tablet by mouth daily.,  Disp: , Rfl:    mupirocin ointment (BACTROBAN) 2 %, Place 1 Application into the nose 2 (two) times daily for 60 doses. Use as directed 2 times daily for 5 days every other week for 6 weeks., Disp: 60 g, Rfl: 0   naloxone (NARCAN) nasal spray 4 mg/0.1 mL, Spray into nostril with signs of opioid related oversedation or overdose, Disp: 1 each, Rfl: 0   nystatin (MYCOSTATIN/NYSTOP) powder, Apply 1 Application topically 3 (three) times daily. (Patient taking differently: Apply 1 Application topically 3 (three) times daily as needed (irritation).), Disp: 15 g, Rfl: 0   oxyCODONE (OXY IR/ROXICODONE) 5 MG immediate release tablet, Take 1 tablet (5 mg total)  by mouth every 4 (four) hours as needed for severe pain., Disp: 30 tablet, Rfl: 0   polyethylene glycol (MIRALAX / GLYCOLAX) 17 g packet, Take 17 g by mouth 2 (two) times daily., Disp: 14 each, Rfl: 0   pregabalin (LYRICA) 25 MG capsule, Take 1-2 capsules (25-50 mg total) by mouth 2 (two) times daily. (Patient taking differently: Take 50 mg by mouth 2 (two) times daily.), Disp: 180 capsule, Rfl: 1   rOPINIRole (REQUIP XL) 4 MG 24 hr tablet, Take 4 mg by mouth at bedtime., Disp: , Rfl:    senna-docusate (SENOKOT-S) 8.6-50 MG tablet, Take 1 tablet by mouth 2 (two) times daily., Disp: 100 tablet, Rfl: 0   acetaminophen (TYLENOL) 500 MG tablet, Take 2 tablets (1,000 mg total) by mouth every 8 (eight) hours as needed for moderate pain, mild pain, fever or headache. (Patient not taking: Reported on 09/25/2022), Disp: 90 tablet, Rfl: 0  Allergies  Allergen Reactions   Chantix [Varenicline] Nausea Only   Glycopyrrolate Rash    Oral irritation    Chart Review: I personally reviewed active problem list, medication list, allergies, family history, social history, health maintenance, notes from last encounter, lab results, imaging with the patient/caregiver today.   Review of Systems  Constitutional: Negative.   HENT: Negative.    Eyes: Negative.    Respiratory: Negative.    Cardiovascular: Negative.   Gastrointestinal: Negative.   Endocrine: Negative.   Genitourinary: Negative.   Musculoskeletal: Negative.   Skin: Negative.   Allergic/Immunologic: Negative.   Neurological:  Positive for tremors.  Hematological: Negative.   Psychiatric/Behavioral: Negative.    All other systems reviewed and are negative.    Objective:    Virtual encounter, vitals limited, only able to obtain the following There were no vitals filed for this visit. There is no height or weight on file to calculate BMI. Nursing Note and Vital Signs reviewed.  Physical Exam Vitals and nursing note reviewed.  Neck:     Trachea: Phonation normal.  Pulmonary:     Effort: No respiratory distress.  Neurological:     Mental Status: She is alert.  Psychiatric:        Mood and Affect: Mood normal.     PE limited by telephone encounter  No results found for this or any previous visit (from the past 72 hour(s)).  Assessment and Plan:     ICD-10-CM   1. Tremor  R25.1 Ambulatory referral to Neurology    2. Polypharmacy  Z79.899 Ambulatory referral to Neurology    3. Family history of parkinsonism  Z82.0 Ambulatory referral to Neurology    Pt previously est with and seeing [facility] neuro but lost to f/up during COVID around 2021 She is having worse sx and would like to get reestablished with Dr. Sherryll Burger  Pt was advised that it may take a few weeks to referral to process and if she doesn't hear from [facility] in a few weeks she can reach out and f/up with them.  - I discussed the assessment and treatment plan with the patient. The patient was provided an opportunity to ask questions and all were answered. The patient agreed with the plan and demonstrated an understanding of the instructions.  - The patient was advised to call back or seek an in-person evaluation if the symptoms worsen or if the condition fails to improve as anticipated.  I provided 15 minutes of  non-face-to-face time during this encounter.  Danelle Berry, PA-C 10/30/22 2:42 PM

## 2022-11-02 NOTE — Progress Notes (Signed)
  Care Coordination Note  11/02/2022 Name: Nicole Austin MRN: [id number] DOB: Aug 21, 1960  Nicole Austin is a 62 y.o. year old female who is a primary care patient of Sander Radon and is actively engaged with the care management team. I reached out to Vena Rua Meskill by phone today to assist with re-scheduling an initial visit with the RN Case Manager  Follow up plan: Telephone appointment with care management team member scheduled for: 11/26/2022  Burman Nieves, [facility] Care Coordination Care Guide Direct Dial: [phone]

## 2022-11-05 DIAGNOSIS — J449 Chronic obstructive pulmonary disease, unspecified: Secondary | ICD-10-CM | POA: Diagnosis not present

## 2022-11-05 DIAGNOSIS — J441 Chronic obstructive pulmonary disease with (acute) exacerbation: Secondary | ICD-10-CM | POA: Diagnosis not present

## 2022-11-07 ENCOUNTER — Telehealth: Payer: Self-pay | Admitting: Family Medicine

## 2022-11-07 DIAGNOSIS — J441 Chronic obstructive pulmonary disease with (acute) exacerbation: Secondary | ICD-10-CM | POA: Diagnosis not present

## 2022-11-07 DIAGNOSIS — J449 Chronic obstructive pulmonary disease, unspecified: Secondary | ICD-10-CM | POA: Diagnosis not present

## 2022-11-07 NOTE — Telephone Encounter (Signed)
Referral Request - Has patient seen PCP for this complaint? Yes.  *If NO, is insurance requiring patient see PCP for this issue before PCP can refer them?  Referral for which specialty: Home Health Care  Preferred provider/office: [facility]  Reason for referral: Patient states that it has been a month since she had total replacement hip surgery and she is not able to do things she needs to do, such as take a shower, etc.   Patients callback # [phone]

## 2022-11-08 NOTE — Telephone Encounter (Signed)
Appt sch'd with Sheliah Mends for Sept 16

## 2022-11-12 ENCOUNTER — Ambulatory Visit: Payer: 59 | Admitting: Family Medicine

## 2022-11-14 DIAGNOSIS — M87059 Idiopathic aseptic necrosis of unspecified femur: Secondary | ICD-10-CM | POA: Diagnosis not present

## 2022-11-15 ENCOUNTER — Ambulatory Visit: Payer: Self-pay

## 2022-11-15 NOTE — Telephone Encounter (Signed)
Appt scheduled for 11/19/22

## 2022-11-15 NOTE — Telephone Encounter (Signed)
Reason for Disposition  [1] SEVERE headache (e.g., excruciating) AND [2] not improved after 2 hours of pain medicine  Headache  (and neurologic deficit)  Answer Assessment - Initial Assessment Questions 1. LOCATION: "Where does it hurt?"      Across forehead  2. ONSET: "When did the headache start?" (Minutes, hours or days)      Worse in the last week 3. PATTERN: "Does the pain come and go, or has it been constant since it started?"     constant 4. SEVERITY: "How bad is the pain?" and "What does it keep you from doing?"  (e.g., Scale 1-10; mild, moderate, or severe)   - MILD (1-3): doesn't interfere with normal activities    - MODERATE (4-7): interferes with normal activities or awakens from sleep    - SEVERE (8-10): excruciating pain, unable to do any normal activities        severe 5. RECURRENT SYMPTOM: "Have you ever had headaches before?" If Yes, ask: "When was the last time?" and "What happened that time?"      no 6. CAUSE: "What do you think is causing the headache?"     *No Answer* 7. MIGRAINE: "Have you been diagnosed with migraine headaches?" If Yes, ask: "Is this headache similar?"      N/a 8. HEAD INJURY: "Has there been any recent injury to the head?"      no 9. OTHER SYMPTOMS: "Do you have any other symptoms?" (fever, stiff neck, eye pain, sore throat, cold symptoms)     Exhaustion,anxious  10. PREGNANCY: "Is there any chance you are pregnant?" "When was your last menstrual period?"       N/a  Answer Assessment - Initial Assessment Questions 1. SYMPTOM: "What is the main symptom you are concerned about?" (e.g., weakness, numbness)     Headache across the forehead for a week 2. ONSET: "When did this start?" (minutes, hours, days; while sleeping)     1 week ago 4. PATTERN "Does this come and go, or has it been constant since it started?"  "Is it present now?"     Constant  5. CARDIAC SYMPTOMS: "Have you had any of the following symptoms: chest pain, difficulty  breathing, palpitations?"  no 6. NEUROLOGIC SYMPTOMS: "Have you had any of the following symptoms: headache, dizziness, vision loss, double vision, changes in speech, unsteady on your feet?"     Dizziness, off balance 7. OTHER SYMPTOMS: "Do you have any other symptoms?"     no  Protocols used: Headache-A-AH, Neurologic Deficit-A-AH

## 2022-11-16 ENCOUNTER — Telehealth: Payer: 59 | Admitting: Physician Assistant

## 2022-11-16 ENCOUNTER — Ambulatory Visit: Payer: Self-pay

## 2022-11-16 NOTE — Telephone Encounter (Signed)
    Chief Complaint: Painful rash to groin and lower abdomen Symptoms: Pain, hurts to wear underwear Frequency: 2 weeks ago, getting worse Pertinent Negatives: Patient denies fever Disposition: [] ED /[] Urgent Care (no appt availability in office) / [x] Appointment(In office/virtual)/ []  [facility]/ [] Home Care/ [] Refused Recommended Disposition /[] [facility]/ []  Follow-up with PCP Additional Notes: Pt. Agrees with appointment. Spoke with Cassandra in practice, ok for virtual visit today. Reason for Disposition  [1] Localized rash is very painful AND [2] no fever  Answer Assessment - Initial Assessment Questions 1. SYMPTOM: "What's the main symptom you're concerned about?" (e.g., pain, itching, dryness)     Rash, groin area 2. LOCATION: "Where is the   located?" (e.g., inside/outside, left/right)     Outside vagina and lower stomach 3. ONSET: "When did the    start?"     2 weeks 4. PAIN: "Is there any pain?" If Yes, ask: "How bad is it?" (Scale: 1-10; mild, moderate, severe)   -  MILD (1-3): Doesn't interfere with normal activities.    -  MODERATE (4-7): Interferes with normal activities (e.g., work or school) or awakens from sleep.     -  SEVERE (8-10): Excruciating pain, unable to do any normal activities.     Severe 5. ITCHING: "Is there any itching?" If Yes, ask: "How bad is it?" (Scale: 1-10; mild, moderate, severe)     No 6. CAUSE: "What do you think is causing the discharge?" "Have you had the same problem before? What happened then?"     Unsure 7. OTHER SYMPTOMS: "Do you have any other symptoms?" (e.g., fever, itching, vaginal bleeding, pain with urination, injury to genital area, vaginal foreign body)     No 8. PREGNANCY: "Is there any chance you are pregnant?" "When was your last menstrual period?"     No  Answer Assessment - Initial Assessment Questions 1. APPEARANCE of RASH: "Describe the rash."      Red 2. LOCATION: "Where is the rash located?"       Groin and lower abdomen 3. NUMBER: "How many spots are there?"      Red skin 4. SIZE: "How big are the spots?" (Inches, centimeters or compare to size of a coin)      N/a 5. ONSET: "When did the rash start?"      2 weeks ago, getting worse 6. ITCHING: "Does the rash itch?" If Yes, ask: "How bad is the itch?"  (Scale 0-10; or none, mild, moderate, severe)     None 7. PAIN: "Does the rash hurt?" If Yes, ask: "How bad is the pain?"  (Scale 0-10; or none, mild, moderate, severe)    - NONE (0): no pain    - MILD (1-3): doesn't interfere with normal activities     - MODERATE (4-7): interferes with normal activities or awakens from sleep     - SEVERE (8-10): excruciating pain, unable to do any normal activities     Severe, pt. Tearful, crying 8. OTHER SYMPTOMS: "Do you have any other symptoms?" (e.g., fever)     No 9. PREGNANCY: "Is there any chance you are pregnant?" "When was your last menstrual period?"     No  Protocols used: Vaginal Symptoms-A-AH, Rash or Redness - Localized-A-AH

## 2022-11-19 ENCOUNTER — Ambulatory Visit: Payer: 59 | Admitting: Physician Assistant

## 2022-11-26 ENCOUNTER — Ambulatory Visit: Payer: 59 | Admitting: Physician Assistant

## 2022-11-26 ENCOUNTER — Ambulatory Visit: Payer: Self-pay | Admitting: *Deleted

## 2022-11-26 DIAGNOSIS — J441 Chronic obstructive pulmonary disease with (acute) exacerbation: Secondary | ICD-10-CM

## 2022-11-26 NOTE — Patient Outreach (Signed)
Care Coordination   Initial Visit Note   11/26/2022 Name: Linna Holmon MRN: [id number] DOB: 09-25-1960  Farida Pentz Strickland is a 62 y.o. year old female who sees Danelle Berry, [place] for primary care. I spoke with  Vena Rua Knisley by phone today.  What matters to the patients health and wellness today?  Patient report she was having issues with rash in groin area, was to see provider today, but state she has been using cream, rash much better.  She does not want to attend appt today as it was not with her PCP, only want to see her own PCP.  Now has aide services 3 days a week, helping with ADL and light housework.  Denies any urgent concerns, encouraged to contact this care manager with questions.      Goals Addressed             This Visit's Progress    Effective management of chronic medical conditions       Interventions Today    Flowsheet Row Most Recent Value  Chronic Disease   Chronic disease during today's visit Chronic Obstructive Pulmonary Disease (COPD), Other  [s/p hip replacement on 8/13]  General Interventions   General Interventions Discussed/Reviewed General Interventions Reviewed, Doctor Visits, Communication with, [facility], Vaccines  Vaccines COVID-19, Flu  Doctor Visits Discussed/Reviewed Doctor Visits Reviewed, PCP, Specialist  [PCP on 10/7]  PCP/Specialist Visits Compliance with follow-up visit  Communication with PCP/Specialists  [Call to PCP office to cancel and reschedule today's appt]  Exercise Interventions   Exercise Discussed/Reviewed Physical Activity  Physical Activity Discussed/Reviewed Physical Activity Reviewed  Janene Madeira she didn't need PT, doing her own exercises]  Education Interventions   Education Provided Provided Education  Provided Verbal Education On Exercise, Medication, When to see the doctor, [employer] not cook, state she eat mostly sandwiches, open to meals on wheels]              SDOH  assessments and interventions completed:  Yes  SDOH Interventions Today    Flowsheet Row Most Recent Value  SDOH Interventions   Food Insecurity Interventions Intervention Not Indicated  Housing Interventions Intervention Not Indicated  Transportation Interventions Intervention Not Indicated        Care Coordination Interventions:  Yes, provided   Follow up plan: Follow up call scheduled for 10/28    Encounter Outcome:  Patient Visit Completed   Kemper Durie, RN, MSN, [facility] Care Management Care Management Coordinator [phone]

## 2022-11-27 ENCOUNTER — Other Ambulatory Visit: Payer: Self-pay | Admitting: Family Medicine

## 2022-11-27 DIAGNOSIS — M87059 Idiopathic aseptic necrosis of unspecified femur: Secondary | ICD-10-CM | POA: Diagnosis not present

## 2022-11-28 NOTE — Addendum Note (Signed)
Addended byKemper Durie on: 11/28/2022 04:27 PM   Modules accepted: Orders

## 2022-11-29 ENCOUNTER — Telehealth: Payer: Self-pay | Admitting: *Deleted

## 2022-11-29 NOTE — Progress Notes (Signed)
Care Coordination Note  11/29/2022 Name: Coreena Tassy MRN: [id number] DOB: 12/19/1960  Toshi Wion Glendenning is a 62 y.o. year old female who is a primary care patient of Sander Radon and is actively engaged with the care management team. I reached out to Vena Rua Cargile by phone today to assist with scheduling an initial visit with the BSW  Follow up plan: Telephone appointment with care management team member scheduled for: 12/07/2022  Burman Nieves, [facility] Care Coordination Care Guide Direct Dial: [phone]

## 2022-12-03 ENCOUNTER — Ambulatory Visit: Payer: 59 | Admitting: Physician Assistant

## 2022-12-05 DIAGNOSIS — J449 Chronic obstructive pulmonary disease, unspecified: Secondary | ICD-10-CM | POA: Diagnosis not present

## 2022-12-05 DIAGNOSIS — Z96642 Presence of left artificial hip joint: Secondary | ICD-10-CM | POA: Diagnosis not present

## 2022-12-05 DIAGNOSIS — Z471 Aftercare following joint replacement surgery: Secondary | ICD-10-CM | POA: Diagnosis not present

## 2022-12-05 DIAGNOSIS — J441 Chronic obstructive pulmonary disease with (acute) exacerbation: Secondary | ICD-10-CM | POA: Diagnosis not present

## 2022-12-07 ENCOUNTER — Ambulatory Visit: Payer: Self-pay

## 2022-12-07 DIAGNOSIS — J441 Chronic obstructive pulmonary disease with (acute) exacerbation: Secondary | ICD-10-CM | POA: Diagnosis not present

## 2022-12-07 DIAGNOSIS — J449 Chronic obstructive pulmonary disease, unspecified: Secondary | ICD-10-CM | POA: Diagnosis not present

## 2022-12-07 NOTE — Patient Instructions (Signed)
Visit Information  Thank you for taking time to visit with me today. Please don't hesitate to contact me if I can be of assistance to you.   Following are the goals we discussed today:  Patient will speak to aide to assist with online food order, pick up and cooking. Patient will speak to [employer] to add name to waiting list.  Our next appointment is by telephone on 12/14/22 at 10:30am  Please call the care guide team at [phone] if you need to cancel or reschedule your appointment.   If you are experiencing a Mental Health or Behavioral Health Crisis or need someone to talk to, please call 911  Patient verbalizes understanding of instructions and care plan provided today and agrees to view in MyChart. Active MyChart status and patient understanding of how to access instructions and care plan via MyChart confirmed with patient.     Telephone follow up appointment with care management team member scheduled for:12/14/22 at 10:30am   Lysle Morales, BSW Social Worker [phone]

## 2022-12-07 NOTE — Patient Outreach (Signed)
Care Coordination   Initial Visit Note   12/07/2022 Name: Nicole Austin MRN: [id number] DOB: February 05, 1961  Nicole Austin is a 62 y.o. year old female who sees Danelle Berry, [place] for primary care. I spoke with  Nicole Austin by phone today.  What matters to the patients health and wellness today?  Patient recently had hip replacement and struggles with mobility.  Patient wants help with Meals on Wheels.  Aide cleans but patient has not requested meal preparation and is unsure if the aide provides the services.    Goals Addressed             This Visit's Progress    Food and Meal Prep       Interventions Today    Flowsheet Row Most Recent Value  Chronic Disease   Chronic disease during today's visit Chronic Obstructive Pulmonary Disease (COPD)  General Interventions   General Interventions Discussed/Reviewed General Interventions Discussed, General Interventions Reviewed, [employer], Communication with  [employer] on Wheels. Pt has an aide and will ask for help w/online ordering and pick up. Pt receives Foodstamps.]  Communication with --  [SW T/c [employer] and left vm for a call back]              SDOH assessments and interventions completed:  Yes  SDOH Interventions Today    Flowsheet Row Most Recent Value  SDOH Interventions   Food Insecurity Interventions Intervention Not Indicated, Other (Comment)  [Food stamps]  Housing Interventions Intervention Not Indicated  Transportation Interventions Intervention Not Indicated, Other (Comment)  [[employer] transportation and [employer]]  Utilities Interventions Intervention Not Indicated        Care Coordination Interventions:  Yes, provided   Follow up plan: Follow up call scheduled for 12/14/22 at 10:30    Encounter Outcome:  Patient Visit Completed

## 2022-12-14 ENCOUNTER — Ambulatory Visit: Payer: Self-pay

## 2022-12-14 DIAGNOSIS — M87059 Idiopathic aseptic necrosis of unspecified femur: Secondary | ICD-10-CM | POA: Diagnosis not present

## 2022-12-14 NOTE — Patient Instructions (Signed)
Visit Information  Thank you for taking time to visit with me today. Please don't hesitate to contact me if I can be of assistance to you.   Following are the goals we discussed today:  Patient will speak to aide to assist with meal preparation. SW will communicate with provider to review hours of PCS.  Our next appointment is by telephone on 1025/24 at 11am  Please call the care guide team at [phone] if you need to cancel or reschedule your appointment.   If you are experiencing a Mental Health or Behavioral Health Crisis or need someone to talk to, please call 911  Patient verbalizes understanding of instructions and care plan provided today and agrees to view in MyChart. Active MyChart status and patient understanding of how to access instructions and care plan via MyChart confirmed with patient.     Telephone follow up appointment with care management team member scheduled for: 12/21/22 at 11am.  Lysle Morales, BSW Social Worker [phone]

## 2022-12-14 NOTE — Patient Outreach (Signed)
Care Coordination   Follow Up Visit Note   12/14/2022 Name: Nicole Austin MRN: [id number] DOB: [birth year]  Nicole Austin Orth is a 62 y.o. year old female who sees Nicole Austin, [place] for primary care. I spoke with  Nicole Austin by phone today.  What matters to the patients health and wellness today?  Patient needs assistance preparing Nicole.    Goals Addressed             This Visit's Progress    Food and Meal Prep       Interventions Today    Flowsheet Row Most Recent Value  General Interventions   General Interventions Discussed/Reviewed Communication with, General Interventions Reviewed, General Interventions Discussed  [Pt has not communicated with aide as she is only there 1 hr 3 days a wk. Pt reports cabinets are full. Pt did not get reply from Nicole Austin. Aide returns on Tuesday and pt will discuss cooking Nicole.]  Communication with --  [SW t/c Nicole Austin and provider will need to complete request to increase hours [id number]. Pt is approved through August 2025. SW t/c Nicole Austin and left vm. T/c from Nicole Austin and patient has been on the waiting list since April '24(2yr wait).]              SDOH assessments and interventions completed:  No     Care Coordination Interventions:  Yes, provided   Follow up plan: Follow up call scheduled for 12/21/22 at 11am.    Encounter Outcome:  Patient Visit Completed

## 2022-12-17 ENCOUNTER — Ambulatory Visit: Payer: Self-pay | Admitting: *Deleted

## 2022-12-17 NOTE — Telephone Encounter (Signed)
  Chief Complaint: shortness of breath with exertion  Symptoms: SOB with exertion/ esp. Walking up incline. Weight gain after hip surgery in August. Feels tired no energy. Hx COPD. Reports dull chest pain like cramp at times.  Frequency: worsening since August Pertinent Negatives: Patient denies chest pain no difficulty breathing no cough no rapid heart beat Disposition: [] ED /[] Urgent Care (no appt availability in office) / [x] Appointment(In office/virtual)/ []  [facility]/ [] Home Care/ [] Refused Recommended Disposition /[] [facility]/ []  Follow-up with PCP Additional Notes:   Offered appt today and patient reports she does not have transportation until Wednesday . Appt scheduled 12/19/22. Recommended if sx worsen go to ED / or call 911 .       Reason for Disposition  [1] Longstanding difficulty breathing (e.g., CHF, COPD, emphysema) AND [2] WORSE than normal  Answer Assessment - Initial Assessment Questions 1. RESPIRATORY STATUS: "Describe your breathing?" (e.g., wheezing, shortness of breath, unable to speak, severe coughing)      Shortness of breath with exertion  2. ONSET: "When did this breathing problem begin?"      Started after hip surgery in August  3. PATTERN "Does the difficult breathing come and go, or has it been constant since it started?"      Comes and goes with exertion  4. SEVERITY: "How bad is your breathing?" (e.g., mild, moderate, severe)    - MILD: No SOB at rest, mild SOB with walking, speaks normally in sentences, can lie down, no retractions, pulse < 100.    - MODERATE: SOB at rest, SOB with minimal exertion and prefers to sit, cannot lie down flat, speaks in phrases, mild retractions, audible wheezing, pulse 100-120.    - SEVERE: Very SOB at rest, speaks in single words, struggling to breathe, sitting hunched forward, retractions, pulse > 120      Moderate at this time after walking  5. RECURRENT SYMPTOM: "Have you had difficulty breathing  before?" If Yes, ask: "When was the last time?" and "What happened that time?"      Yes has oxygen but has not had to use oxygen  6. CARDIAC HISTORY: "Do you have any history of heart disease?" (e.g., heart attack, angina, bypass surgery, angioplasty)      See hx  7. LUNG HISTORY: "Do you have any history of lung disease?"  (e.g., pulmonary embolus, asthma, emphysema)     Hx COPD  8. CAUSE: "What do you think is causing the breathing problem?"      Weight gain no energy  9. OTHER SYMPTOMS: "Do you have any other symptoms? (e.g., dizziness, runny nose, cough, chest pain, fever)     Shortness of breath with exertion .  10. O2 SATURATION MONITOR:  "Do you use an oxygen saturation monitor (pulse oximeter) at home?" If Yes, ask: "What is your reading (oxygen level) today?" "What is your usual oxygen saturation reading?" (e.g., 95%)       na 11. PREGNANCY: "Is there any chance you are pregnant?" "When was your last menstrual period?"       na 12. TRAVEL: "Have you traveled out of the country in the last month?" (e.g., travel history, exposures)       na  Protocols used: Breathing Difficulty-A-AH

## 2022-12-18 ENCOUNTER — Emergency Department
Admission: EM | Admit: 2022-12-18 | Discharge: 2022-12-18 | Disposition: A | Discharge status: Home / Self Care | Payer: 59 | Attending: Emergency Medicine | Admitting: Emergency Medicine

## 2022-12-18 ENCOUNTER — Emergency Department: Payer: 59

## 2022-12-18 ENCOUNTER — Other Ambulatory Visit: Payer: Self-pay

## 2022-12-18 DIAGNOSIS — M1711 Unilateral primary osteoarthritis, right knee: Secondary | ICD-10-CM | POA: Diagnosis not present

## 2022-12-18 DIAGNOSIS — W1839XA Other fall on same level, initial encounter: Secondary | ICD-10-CM | POA: Insufficient documentation

## 2022-12-18 DIAGNOSIS — S92415A Nondisplaced fracture of proximal phalanx of left great toe, initial encounter for closed fracture: Secondary | ICD-10-CM | POA: Insufficient documentation

## 2022-12-18 DIAGNOSIS — Z96642 Presence of left artificial hip joint: Secondary | ICD-10-CM | POA: Insufficient documentation

## 2022-12-18 DIAGNOSIS — W19XXXA Unspecified fall, initial encounter: Secondary | ICD-10-CM | POA: Diagnosis not present

## 2022-12-18 DIAGNOSIS — J45909 Unspecified asthma, uncomplicated: Secondary | ICD-10-CM | POA: Diagnosis not present

## 2022-12-18 DIAGNOSIS — Y92009 Unspecified place in unspecified non-institutional (private) residence as the place of occurrence of the external cause: Secondary | ICD-10-CM | POA: Diagnosis not present

## 2022-12-18 DIAGNOSIS — Z743 Need for continuous supervision: Secondary | ICD-10-CM | POA: Diagnosis not present

## 2022-12-18 DIAGNOSIS — S92412A Displaced fracture of proximal phalanx of left great toe, initial encounter for closed fracture: Secondary | ICD-10-CM | POA: Diagnosis not present

## 2022-12-18 DIAGNOSIS — S99922A Unspecified injury of left foot, initial encounter: Secondary | ICD-10-CM | POA: Diagnosis present

## 2022-12-18 DIAGNOSIS — M7732 Calcaneal spur, left foot: Secondary | ICD-10-CM | POA: Diagnosis not present

## 2022-12-18 DIAGNOSIS — S92425A Nondisplaced fracture of distal phalanx of left great toe, initial encounter for closed fracture: Secondary | ICD-10-CM | POA: Diagnosis not present

## 2022-12-18 DIAGNOSIS — M25561 Pain in right knee: Secondary | ICD-10-CM | POA: Diagnosis not present

## 2022-12-18 DIAGNOSIS — M549 Dorsalgia, unspecified: Secondary | ICD-10-CM | POA: Diagnosis not present

## 2022-12-18 MED ORDER — HYDROCODONE-ACETAMINOPHEN 5-325 MG PO TABS: 1.0000 | ORAL_TABLET | Freq: Three times a day (TID) | ORAL | 0 refills | Status: AC | PRN

## 2022-12-18 NOTE — ED Provider Triage Note (Signed)
Emergency Medicine Provider Triage Evaluation Note  Sharane Deibel , a 62 y.o. female  was evaluated in triage.  Pt complains of fall last night.  Complaining of left foot, right knee pain.  No other injuries.  No head injury or LOC.  Review of Systems  Positive:  Negative:   Physical Exam  BP 139/67   Pulse 66   Temp 98.3 F (36.8 C)   Resp 20   Ht 5\' 1"  (1.549 m)   Wt 102.5 kg   LMP  (LMP Unknown)   SpO2 96%   BMI 42.70 kg/m  Gen:   Awake, no distress   Resp:  Normal effort  MSK:   Moves extremities without difficulty, right knee tender to palpation, left foot swollen and tender Other:    Medical Decision Making  Medically screening exam initiated at 1:42 PM.  Appropriate orders placed.  Wendell Rodezno Casella was informed that the remainder of the evaluation will be completed by another provider, this initial triage assessment does not replace that evaluation, and the importance of remaining in the ED until their evaluation is complete.     Faythe Ghee, PA-C 12/18/22 1343

## 2022-12-18 NOTE — ED Triage Notes (Signed)
Arrives from home via [facility].  C/O stumble and fall yesterday eveing.  C/O stiffness/aching to roe and low back.  Recent Left hip replacement 8 weeks ago, using walker to ambulate.  No LOC. No head trauma.  VSS

## 2022-12-18 NOTE — Discharge Instructions (Addendum)
Wear the postop shoe for support.  Rest with the foot elevated when seated.  Take the prescription meds as needed.  Follow-up with Dr. Logan Bores at [facility] podiatry for further fracture care.

## 2022-12-18 NOTE — ED Provider Notes (Signed)
[facility] Emergency Department Provider Note     Event Date/Time   First MD Initiated Contact with Patient 12/18/22 [phone]     (approximate)   History   Fall   HPI  Nicole Austin is a 62 y.o. female with a history of ADHD, bipolar disorder, asthma, and arthritis, presents to the ED for evaluation of injury sustained following mechanical fall.  She presents via EMS from home, for evaluation of injury after a fall yesterday evening.  Patient presents with pain and stiffness to her left great toe as well as her lower back.  She is 8 weeks status post a total hip arthroplasty on the left.  She denies any hip pain at this time.   Physical Exam   Triage Vital Signs: ED Triage Vitals  Encounter Vitals Group     BP 12/18/22 1338 139/67     Systolic BP Percentile --      Diastolic BP Percentile --      Pulse Rate 12/18/22 1338 66     Resp 12/18/22 1338 20     Temp 12/18/22 1338 98.3 F (36.8 C)     Temp src --      SpO2 12/18/22 1338 96 %     Weight 12/18/22 1336 225 lb 15.5 oz (102.5 kg)     Height 12/18/22 1336 5\' 1"  (1.549 m)     Head Circumference --      Peak Flow --      Pain Score 12/18/22 1336 5     Pain Loc --      Pain Education --      Exclude from Growth Chart --     Most recent vital signs: Vitals:   12/18/22 1338  BP: 139/67  Pulse: 66  Resp: 20  Temp: 98.3 F (36.8 C)  SpO2: 96%    General Awake, no distress. NAD HEENT NCAT. PERRL. EOMI. No rhinorrhea. Mucous membranes are moist. CV:  Good peripheral perfusion.  RESP:  Normal effort.  ABD:  No distention.  MSK:  Right knee without obvious deformity, dislocation, or sulcus sign.  Normal active range of motion on exam without evidence of internal derangement.  The left foot shows some subtle soft tissue swelling dorsally as well as some tenderness over the great toe dorsal-medially   ED Results / Procedures / Treatments   Labs (all labs ordered are listed, but  only abnormal results are displayed) Labs Reviewed - No data to display   EKG   RADIOLOGY  I personally viewed and evaluated these images as part of my medical decision making, as well as reviewing the written report by the radiologist.  ED Provider Interpretation: Left great toe middle phalanx fracture  DG Left Foot  IMPRESSION: Acute nondisplaced intra-articular fracture at the medial base of the proximal phalanx of the great toe.   DG Right Knee  IMPRESSION: 1. No acute fracture or dislocation. 2. Mild patellofemoral osteoarthritis.  PROCEDURES:  Critical Care performed: No  Procedures   MEDICATIONS ORDERED IN ED: Medications - No data to display   IMPRESSION / MDM / ASSESSMENT AND PLAN / ED COURSE  I reviewed the triage vital signs and the nursing notes.                              Differential diagnosis includes, but is not limited to, foot sprain, foot fracture, knee sprain, internal derangement  Patient's  presentation is most consistent with acute complicated illness / injury requiring diagnostic workup.  Patient's diagnosis is consistent with fall with a nondisplaced proximal phalanx fracture left great toe.  Morphology is confirmed on plain film reviewed by me.  No other radiologic evidence of fracture based on interpretation.  No complaints of any pain to the hips or pelvis bilaterally.  Patient will be placed in a postop shoe for initial fracture care.  Patient will be discharged home with prescriptions for hydrocodone. Patient is to follow up with dietary as discussed, as needed or otherwise directed. Patient is given ED precautions to return to the ED for any worsening or new symptoms.   FINAL CLINICAL IMPRESSION(S) / ED DIAGNOSES   Final diagnoses:  Fall in home, initial encounter  Nondisplaced fracture of proximal phalanx of left great toe, initial encounter for closed fracture     Rx / DC Orders   ED Discharge Orders          Ordered     HYDROcodone-acetaminophen (NORCO) 5-325 MG tablet  3 times daily PRN        12/18/22 1643             Note:  This document was prepared using Dragon voice recognition software and may include unintentional dictation errors.    Lissa Hoard, PA-C 12/22/22 0024    Minna Antis, MD 12/22/22 Jerene Bears

## 2022-12-19 ENCOUNTER — Ambulatory Visit: Payer: Self-pay | Admitting: *Deleted

## 2022-12-19 ENCOUNTER — Ambulatory Visit: Payer: 59 | Admitting: Nurse Practitioner

## 2022-12-19 NOTE — Telephone Encounter (Signed)
  Chief Complaint: Changed her appt for shortness of breath to 12/26/2022.   I called to check on her. Symptoms: She broke her toe last night and ended up in the ED.   They put her in a boot and she is sore when trying to ambulate so postponed her appt for this reason.    Frequency: Not in respiratory distress.    "I'm fine".     Pertinent Negatives: Patient denies Shortness of breath right now.  Disposition: [] ED /[] Urgent Care (no appt availability in office) / [x] Appointment(In office/virtual)/ []  [facility]/ [] Home Care/ [] Refused Recommended Disposition /[] [facility]/ []  Follow-up with PCP Additional Notes: I let her know to call us back if the shortness of breath becomes worse before her upcoming appt.   She was agreeable to this plan and thanked me for calling and checking on her.

## 2022-12-19 NOTE — Telephone Encounter (Signed)
Message from Nicole Austin sent at 12/19/2022  8:28 AM EDT  Summary: rescheduled SOB appt.   Patient called to reschedule her visit today for shortness of breath due to she took a fall & broke her toe and wound up in the hospital last night. Asked patient was she having any shortness of breath symptoms now, patient states no trouble right now, rescheduled for next week, 10.30.2024, per patient's request.  Please advise. Patients callback #  [phone]          Call History  Contact Date/Time Type Contact Phone/Fax User  12/19/2022 08:27 AM EDT Phone (Incoming) [place]heim, Taifa Zahradnik (Self) [phone] Rexene Edison) Muck, Army Melia   Reason for Disposition  [1] MILD longstanding difficulty breathing AND [2]  SAME as normal    Has an appt for 12/26/2022.   Called and checked on pt.   She is not in any distress.  Answer Assessment - Initial Assessment Questions 1. RESPIRATORY STATUS: "Describe your breathing?" (e.g., wheezing, shortness of breath, unable to speak, severe coughing)      I'm short of breath with exertion.    I  had to change my appt to 12/26/2022.   I'm overweight which contributes to me being short of breath.   I ended up going to ED last night due to a broken toe.   That's why I changed my appt.   It's hard to get around right now.   I'm getting used to this boot they put me in and I'm getting over left hip replacement.  I broke my toe on my left foot.   "So I'm having fun" and laughed.   Good natured with what is going on with her. I let her know to call us back if she becomes more short of breath or gets worse in case we need to see her sooner.   She was agreeable to this and thanked me very much for calling and checking on her. No triage needed at this point.   She denied having any worsening of short ness of breath right now.   "I'm fine".   "I'm not in any distress or anything like that".     2. ONSET: "When did this breathing problem begin?"      No triage needed 3. PATTERN "Does  the difficult breathing come and go, or has it been constant since it started?"       4. SEVERITY: "How bad is your breathing?" (e.g., mild, moderate, severe)    - MILD: No SOB at rest, mild SOB with walking, speaks normally in sentences, can lie down, no retractions, pulse < 100.    - MODERATE: SOB at rest, SOB with minimal exertion and prefers to sit, cannot lie down flat, speaks in phrases, mild retractions, audible wheezing, pulse 100-120.    - SEVERE: Very SOB at rest, speaks in single words, struggling to breathe, sitting hunched forward, retractions, pulse > 120       5. RECURRENT SYMPTOM: "Have you had difficulty breathing before?" If Yes, ask: "When was the last time?" and "What happened that time?"       6. CARDIAC HISTORY: "Do you have any history of heart disease?" (e.g., heart attack, angina, bypass surgery, angioplasty)       7. LUNG HISTORY: "Do you have any history of lung disease?"  (e.g., pulmonary embolus, asthma, emphysema)      8. CAUSE: "What do you think is causing the breathing problem?"  9. OTHER SYMPTOMS: "Do you have any other symptoms? (e.g., dizziness, runny nose, cough, chest pain, fever)      10. O2 SATURATION MONITOR:  "Do you use an oxygen saturation monitor (pulse oximeter) at home?" If Yes, ask: "What is your reading (oxygen level) today?" "What is your usual oxygen saturation reading?" (e.g., 95%)        11. PREGNANCY: "Is there any chance you are pregnant?" "When was your last menstrual period?"        12. TRAVEL: "Have you traveled out of the country in the last month?" (e.g., travel history, exposures)  Protocols used: Breathing Difficulty-A-AH

## 2022-12-21 ENCOUNTER — Ambulatory Visit: Payer: Self-pay

## 2022-12-21 NOTE — Patient Outreach (Signed)
Care Coordination   Follow Up Visit Note   12/21/2022 Name: Nicole Austin MRN: [id number] DOB: 06/22/1960  Nicole Austin is a 61 y.o. year old female who sees Nicole Austin, [place] for primary care. I spoke with  Nicole Austin by phone today.  What matters to the patients health and wellness today?  Patient needs additional PCS hours so aide can prepare meals.    Goals Addressed             This Visit's Progress    Food and Meal Prep       Interventions Today    Flowsheet Row Most Recent Value  General Interventions   General Interventions Discussed/Reviewed General Interventions Discussed, General Interventions Reviewed, Doctor Visits, Level of Care  [Pt fell and broke toe. Nicole Austin and Nicole Austin come daily to bring food. Nicole Austin apt 10/30 to address increasing PCS hrs. SW informed that she is on the Meals on Wheels waiting list since April.]              SDOH assessments and interventions completed:  No     Care Coordination Interventions:  Yes, provided   Follow up plan: Follow up call scheduled for 01/01/23 at 1pm    Encounter Outcome:  Patient Visit Completed

## 2022-12-24 ENCOUNTER — Ambulatory Visit: Payer: Self-pay | Admitting: *Deleted

## 2022-12-24 NOTE — Patient Outreach (Signed)
Care Coordination   Follow Up Visit Note   12/24/2022 Name: Nicole Austin MRN: [id number] DOB: 29-Mar-1960  Nicole Austin is a 62 y.o. year old female who sees Danelle Berry, [place] for primary care. I spoke with  Nicole Austin by phone today.  What matters to the patients health and wellness today?  Patient had fall in the last week, was seen in the ED, broken toe.  She admits not using DME prior to fall, but verbalized understanding of need.  Denies any urgent concerns, encouraged to contact this care manager with questions.      Goals Addressed             This Visit's Progress    Effective management of chronic medical conditions   On track    Interventions Today    Flowsheet Row Most Recent Value  Chronic Disease   Chronic disease during today's visit Chronic Obstructive Pulmonary Disease (COPD), Other  [recent fall resulting in broken toe and hip surgery]  General Interventions   General Interventions Discussed/Reviewed General Interventions Reviewed, Doctor Visits, Durable Medical Equipment (DME)  Doctor Visits Discussed/Reviewed Doctor Visits Reviewed, PCP, Specialist  [upcoming tomorrow with podiatry and 10/30 with PCP.  BSW will follow up with patient on 11/5]  Durable Medical Equipment (DME) Dan Humphreys  [cane]  PCP/Specialist Visits Compliance with follow-up visit  Education Interventions   Education Provided Provided Education  Provided Verbal Education On Medication, When to see the doctor  Safety Interventions   Safety Discussed/Reviewed Fall Risk, Safety Reviewed  [Reminded to use DME and proper lighting with mobility]                SDOH assessments and interventions completed:  No     Care Coordination Interventions:  Yes, provided   Follow up plan: Follow up call scheduled for 12/2    Encounter Outcome:  Patient Visit Completed   Kemper Durie RN, MSN, CCM [place]  [facility], [facility] RN Care  Coordinator Direct Dial: [phone] / Main [phone] Fax [phone] Email: [email or web address] Website: [email or web address]

## 2022-12-25 ENCOUNTER — Ambulatory Visit (INDEPENDENT_AMBULATORY_CARE_PROVIDER_SITE_OTHER): Payer: 59 | Admitting: Podiatry

## 2022-12-25 ENCOUNTER — Encounter: Payer: Self-pay | Admitting: Podiatry

## 2022-12-25 ENCOUNTER — Telehealth: Payer: Self-pay | Admitting: Podiatry

## 2022-12-25 VITALS — Ht 61.0 in | Wt 225.0 lb

## 2022-12-25 DIAGNOSIS — M79675 Pain in left toe(s): Secondary | ICD-10-CM | POA: Diagnosis not present

## 2022-12-25 DIAGNOSIS — S92405A Nondisplaced unspecified fracture of left great toe, initial encounter for closed fracture: Secondary | ICD-10-CM | POA: Diagnosis not present

## 2022-12-25 DIAGNOSIS — B351 Tinea unguium: Secondary | ICD-10-CM | POA: Diagnosis not present

## 2022-12-25 DIAGNOSIS — M79674 Pain in right toe(s): Secondary | ICD-10-CM

## 2022-12-25 NOTE — Telephone Encounter (Signed)
Patient is asking if she can have something for pain sent to [facility].

## 2022-12-25 NOTE — Progress Notes (Signed)
Chief Complaint  Patient presents with   Fracture    Patient is here for great left toe fracture 12/18/2022   Nail Problem    Patient wants her nails trimmed    SUBJECTIVE Patient presents to office today as a new patient for evaluation of a left great toe fracture sustained on 12/18/2022 when she sustained a fall injury at home.  Went to the emergency department and diagnosed with nondisplaced fracture of the great toe.  She is currently WBAT in a surgical shoe.   Patient also complaining of elongated, thickened nails that cause pain while ambulating in shoes.  Patient is unable to trim their own nails. Patient is here for further evaluation and treatment.  Past Medical History:  Diagnosis Date   ADHD (attention deficit hyperactivity disorder)    Apnea, sleep 06/30/2015   Arthritis    Asthma    Asthma with acute exacerbation 06/30/2015   Benign neoplasm of ascending colon    Bipolar 1 disorder (HCC)    Chronic pain    COPD, moderate (HCC) 11/18/2013   Depression    DOE (dyspnea on exertion)    Fall 07/04/2022   GERD (gastroesophageal reflux disease) 08/30/2015   Gout 08/16/2014   History of acute myocardial infarction 06/30/2015   History of panic attacks    HPV (human papilloma virus) infection 07/17/2013   Hyperlipidemia    Left knee pain 07/06/2022   Low HDL (under 40) 08/30/2015   MI (myocardial infarction) (HCC) 2018   MRSA infection 2023   groin abscess   Nose colonized with MRSA 03/21/2022   a.) PCR (+) prior to LEFT THA   Osteoporosis    Parkinson disease (HCC)    Polyp of sigmoid colon    Pre-diabetes    Prediabetes 05/20/2016   PTSD (post-traumatic stress disorder)    Restless leg syndrome    Rhabdomyolysis    Sleep apnea 06/30/2015   Stage 3 severe COPD by GOLD classification (HCC) 11/18/2013   Stroke (HCC) 2018   right arm weakness   Traumatic rhabdomyolysis (HCC) 07/04/2022   Tremors of nervous system    hands   Uncomplicated asthma 06/30/2015    Varicella 02/25/2017    Allergies  Allergen Reactions   Chantix [Varenicline] Nausea Only   Glycopyrrolate Rash    Oral irritation     OBJECTIVE General Patient is awake, alert, and oriented x 3 and in no acute distress. Derm Skin is dry and supple bilateral. Negative open lesions or macerations. Remaining integument unremarkable. Nails are tender, long, thickened and dystrophic with subungual debris, consistent with onychomycosis, 1-5 bilateral. No signs of infection noted. Vasc  DP and PT pedal pulses palpable bilaterally. Temperature gradient within normal limits.  Neuro Epicritic and protective threshold sensation grossly intact bilaterally.  Musculoskeletal Exam No symptomatic pedal deformities noted bilateral. Muscular strength within normal limits.  Tenderness with light touch and palpation to the LT great toe joint DG Foot Complete Left 12/18/2022 IMPRESSION: Acute nondisplaced intra-articular fracture at the medial base of the proximal phalanx of the great toe.  ASSESSMENT 1.  Pain due to onychomycosis of toenails both -Patient evaluated today.  -Instructed to maintain good pedal hygiene and foot care.  -Mechanical debridement of nails 1-5 bilaterally performed using a nail nipper. Filed with dremel without incident.   2.  Closed nondisplaced fracture left great toe -Continue WBAT surgical shoe for an additional 4-6 weeks until symptoms resolve -No surgery is warranted at this time. -Recommend rest and reduced activity -  Motrin as needed -Return to clinic as needed   Felecia Shelling, DPM [facility]  Dr. Felecia Shelling, DPM    [address] [place], [place] [address]                Office [phone]  Fax [phone]

## 2022-12-26 ENCOUNTER — Ambulatory Visit: Payer: 59 | Admitting: Family Medicine

## 2022-12-26 ENCOUNTER — Other Ambulatory Visit: Payer: Self-pay | Admitting: Internal Medicine

## 2022-12-26 LAB — HM DIABETES EYE EXAM

## 2022-12-27 ENCOUNTER — Other Ambulatory Visit: Payer: Self-pay | Admitting: Podiatry

## 2022-12-27 MED ORDER — OXYCODONE-ACETAMINOPHEN 5-325 MG PO TABS: 1.0000 | ORAL_TABLET | Freq: Three times a day (TID) | ORAL | 0 refills | Status: DC | PRN

## 2022-12-27 NOTE — Progress Notes (Signed)
PRN pain secondary to toe fracture.

## 2022-12-27 NOTE — Telephone Encounter (Signed)
Requested medication (s) are due for refill today:   Provider to review  Requested medication (s) are on the active medication list:   Yes  Future visit scheduled:   Yes 11/4 with Raynelle Fanning   Last ordered: 11/29/2022 #120, 0 refills  Non delegated refill    Requested Prescriptions  Pending Prescriptions Disp Refills   pregabalin (LYRICA) 25 MG capsule [Pharmacy Med Name: PREGABALIN 25MG  CAPSULE] 120 capsule 0    Sig: TAKE ONE (1) TO TWO (2) CAPSULES (25-50 MG TOTAL) BY MOUTH TWO TIMES DAILY.     Not Delegated - Neurology:  Anticonvulsants - Controlled - pregabalin Failed - 12/26/2022  3:53 PM      Failed - This refill cannot be delegated      Passed - Cr in normal range and within 360 days    Creat  Date Value Ref Range Status  07/31/2022 1.05 0.50 - 1.05 mg/dL Final   Creatinine, Ser  Date Value Ref Range Status  10/10/2022 0.76 0.44 - 1.00 mg/dL Final   Creatinine, Urine  Date Value Ref Range Status  10/04/2015 CANCELED 20 - 320 mg/dL     Comment:    Test not performed, no urine was received.    Result canceled by the ancillary   10/04/2015 128 20 - 320 mg/dL Final         Passed - Completed PHQ-2 or PHQ-9 in the last 360 days      Passed - Valid encounter within last 12 months    Recent Outpatient Visits           1 month ago Tremor   [facility] Danelle Berry, PA-C   3 months ago Unspecified abnormalities of gait and mobility   [facility] Danelle Berry, PA-C   4 months ago Encounter for examination following treatment at hospital   [facility] Danelle Berry, PA-C   6 months ago Intertrigo   [facility] Margarita Mail, DO   6 months ago Intertrigo   [facility] Danelle Berry, PA-C       Future Appointments             In 4 days Zane Herald, Rudolpho Sevin, FNP [facility], [facility]

## 2022-12-27 NOTE — Telephone Encounter (Signed)
Sent.  Thanks, Dr. Amalia Hailey

## 2022-12-29 ENCOUNTER — Other Ambulatory Visit: Payer: Self-pay | Admitting: Podiatry

## 2022-12-31 ENCOUNTER — Ambulatory Visit: Payer: 59 | Admitting: Nurse Practitioner

## 2022-12-31 NOTE — Progress Notes (Deleted)
   LMP  (LMP Unknown)    Subjective:    Patient ID: Nicole Austin, female    DOB: 1960-08-15, 62 y.o.   MRN: [id number]  HPI: Nicole Austin is a 62 y.o. female  No chief complaint on file.   Relevant past medical, surgical, family and social history reviewed and updated as indicated. Interim medical history since our last visit reviewed. Allergies and medications reviewed and updated.  Review of Systems  Ten systems reviewed and is negative except as mentioned in HPI ***      Objective:    LMP  (LMP Unknown)   Wt Readings from Last 3 Encounters:  12/25/22 225 lb (102.1 kg)  12/18/22 225 lb 15.5 oz (102.5 kg)  10/09/22 225 lb 15.5 oz (102.5 kg)    Physical Exam  Constitutional: Patient appears well-developed and well-nourished. Obese *** No distress.  HEENT: head atraumatic, normocephalic, pupils equal and reactive to light, ears ***, neck supple, throat within normal limits Cardiovascular: Normal rate, regular rhythm and normal heart sounds.  No murmur heard. No BLE edema. Pulmonary/Chest: Effort normal and breath sounds normal. No respiratory distress. Abdominal: Soft.  There is no tenderness. Psychiatric: Patient has a normal mood and affect. behavior is normal. Judgment and thought content normal.      Assessment & Plan:   Problem List Items Addressed This Visit   None    Follow up plan: No follow-ups on file.

## 2023-01-01 ENCOUNTER — Ambulatory Visit: Payer: Self-pay

## 2023-01-01 NOTE — Patient Outreach (Signed)
  Care Coordination   01/01/2023 Name: Nicole Austin MRN: [id number] DOB: Sep 28, 1960   Care Coordination Outreach Attempts:  An unsuccessful telephone outreach was attempted for a scheduled appointment today.  Follow Up Plan:  Additional outreach attempts will be made to offer the patient care coordination information and services.   Encounter Outcome:  No Answer   Care Coordination Interventions:  No, not indicated    SIG Lysle Morales, BSW Social Worker [phone]

## 2023-01-04 ENCOUNTER — Ambulatory Visit: Payer: 59 | Admitting: Physician Assistant

## 2023-01-05 DIAGNOSIS — J449 Chronic obstructive pulmonary disease, unspecified: Secondary | ICD-10-CM | POA: Diagnosis not present

## 2023-01-05 DIAGNOSIS — J441 Chronic obstructive pulmonary disease with (acute) exacerbation: Secondary | ICD-10-CM | POA: Diagnosis not present

## 2023-01-07 DIAGNOSIS — J449 Chronic obstructive pulmonary disease, unspecified: Secondary | ICD-10-CM | POA: Diagnosis not present

## 2023-01-07 DIAGNOSIS — J441 Chronic obstructive pulmonary disease with (acute) exacerbation: Secondary | ICD-10-CM | POA: Diagnosis not present

## 2023-01-14 DIAGNOSIS — M87059 Idiopathic aseptic necrosis of unspecified femur: Secondary | ICD-10-CM | POA: Diagnosis not present

## 2023-01-23 ENCOUNTER — Other Ambulatory Visit: Payer: Self-pay | Admitting: Family Medicine

## 2023-01-26 ENCOUNTER — Emergency Department: Payer: 59

## 2023-01-26 ENCOUNTER — Other Ambulatory Visit: Payer: Self-pay

## 2023-01-26 ENCOUNTER — Emergency Department
Admission: EM | Admit: 2023-01-26 | Discharge: 2023-01-26 | Disposition: A | Discharge status: Home / Self Care | Payer: 59 | Attending: Emergency Medicine | Admitting: Emergency Medicine

## 2023-01-26 DIAGNOSIS — J449 Chronic obstructive pulmonary disease, unspecified: Secondary | ICD-10-CM | POA: Diagnosis not present

## 2023-01-26 DIAGNOSIS — X509XXA Other and unspecified overexertion or strenuous movements or postures, initial encounter: Secondary | ICD-10-CM | POA: Diagnosis not present

## 2023-01-26 DIAGNOSIS — Z96642 Presence of left artificial hip joint: Secondary | ICD-10-CM | POA: Diagnosis not present

## 2023-01-26 DIAGNOSIS — S39012A Strain of muscle, fascia and tendon of lower back, initial encounter: Secondary | ICD-10-CM | POA: Diagnosis not present

## 2023-01-26 DIAGNOSIS — I10 Essential (primary) hypertension: Secondary | ICD-10-CM | POA: Insufficient documentation

## 2023-01-26 DIAGNOSIS — M25551 Pain in right hip: Secondary | ICD-10-CM | POA: Diagnosis not present

## 2023-01-26 DIAGNOSIS — M47816 Spondylosis without myelopathy or radiculopathy, lumbar region: Secondary | ICD-10-CM | POA: Diagnosis not present

## 2023-01-26 MED ORDER — KETOROLAC TROMETHAMINE 30 MG/ML IJ SOLN
30.0000 mg | Freq: Once | INTRAMUSCULAR | Status: AC
  Administered 2023-01-26: 30 mg via INTRAMUSCULAR
  Filled 2023-01-26: qty 1

## 2023-01-26 NOTE — Discharge Instructions (Signed)
Your exam and x-ray are normal and reassuring at this time.  No signs of a serious injury related to your hip or lower back.  No signs of any acute nerve injury or spinal cord damage.  Take your prescription medicines as prescribed.  Follow-up with your primary provider or return to the ED needed.

## 2023-01-26 NOTE — ED Triage Notes (Signed)
Pt to ED via [facility] from home  Stood earl;ier, felt "pop" in R hip 10/10 pain, no obvious deformity or shortening Hx hip replacement to L 8/24  No dizziness CP SOB EMS VS:130/67, pulse 74, 96% on RA

## 2023-01-26 NOTE — ED Provider Notes (Signed)
[facility] Emergency Department Provider Note     Event Date/Time   First MD Initiated Contact with Patient 01/26/23 1342     (approximate)   History   Hip Pain   HPI  Nicole Austin is a 62 y.o. female with a history of HTN, HLD, bipolar 1, COPD, and LEFT total hip arthroplasty, presents to the ED with acute pain to the right hip.  Patient presents via EMS from home, noting that she stood up early and felt a "pop" in her hip as she transitioned from sit to stand.  She denies any outright fall or trauma to the right hip.  No reports of deformity or foreshortening by EMS.  Patient would endorse 10 out of 10 pain at this time.  No reports of any dizziness, chest pain, shortness of breath.  Vital signs reported as stable by EMS.  During physical exam when asked to localize pain, the patient would place her hand on her right lumbar sacral junction.  She denies any pain to the lateral hip or to the groin region.  She denies any bladder or bowel incontinence, foot drop, or saddle anesthesias.  No RLE referral pain is reported.  Physical Exam   Triage Vital Signs: ED Triage Vitals  Encounter Vitals Group     BP 01/26/23 1337 135/60     Systolic BP Percentile --      Diastolic BP Percentile --      Pulse Rate 01/26/23 1337 (!) 58     Resp 01/26/23 1337 20     Temp 01/26/23 1337 98.4 F (36.9 C)     Temp Source 01/26/23 1337 Oral     SpO2 01/26/23 1337 98 %     Weight 01/26/23 1336 150 lb (68 kg)     Height 01/26/23 1336 5\' 1"  (1.549 m)     Head Circumference --      Peak Flow --      Pain Score 01/26/23 1333 10     Pain Loc --      Pain Education --      Exclude from Growth Chart --     Most recent vital signs: Vitals:   01/26/23 1337 01/26/23 1444  BP: 135/60 (!) 119/53  Pulse: (!) 58 68  Resp: 20 14  Temp: 98.4 F (36.9 C)   SpO2: 98% 95%    General Awake, no distress. NAD HEENT NCAT. PERRL. EOMI. No rhinorrhea. Mucous membranes are  moist.  CV:  Good peripheral perfusion. RRR RESP:  Normal effort. CTA ABD:  No distention.  MSK:  Normal spinal alignment without midline tenderness, spasm, deformity, or step-off.  Patient tenderness to palpation to the right lumbar sacral junction.  Normal flexion extension range to the RLE.  Normal internal/external rotation of the right hip.  Knee exam is consistent with full active range without evidence of internal derangement.  Foot and ankle distally without acute findings. NEURO: Cranial nerves II to XII grossly intact.  Normal LE DTRs bilaterally.  Normal toe dorsiflexion and foot eversion on exam.   ED Results / Procedures / Treatments   Labs (all labs ordered are listed, but only abnormal results are displayed) Labs Reviewed - No data to display   EKG   RADIOLOGY  I personally viewed and evaluated these images as part of my medical decision making, as well as reviewing the written report by the radiologist.  ED Provider Interpretation: No acute findings  DG Hip Unilat  With Pelvis 2-3 Views Right  Result Date: 01/26/2023 CLINICAL DATA:  Right hip pain. The patient felt something pop before experiencing severe pain. EXAM: DG HIP (WITH OR WITHOUT PELVIS) 2-3V RIGHT COMPARISON:  CT renal stone protocol 6/10/4. FINDINGS: The right hip is located.  No acute fracture is present. Left total hip arthroplasty is noted. Degenerative changes are present in the lower lumbar spine. IMPRESSION: 1. No acute abnormality of the right hip. 2. Left total hip arthroplasty. 3. Degenerative changes in the lower lumbar spine. Electronically Signed   By: Marin Roberts M.D.   On: 01/26/2023 14:21     PROCEDURES:  Critical Care performed: No  Procedures   MEDICATIONS ORDERED IN ED: Medications  ketorolac (TORADOL) 30 MG/ML injection 30 mg (30 mg Intramuscular Given 01/26/23 1430)     IMPRESSION / MDM / ASSESSMENT AND PLAN / ED COURSE  I reviewed the triage vital signs and the  nursing notes.                              Differential diagnosis includes, but is not limited to, hip pain, periprosthetic fracture, hip strain, hip contusion, lumbar strain, lumbar radiculopathy  Patient's presentation is most consistent with acute complicated illness / injury requiring diagnostic workup.  Patient's diagnosis is consistent with acute lumbar strain.  Patient with reassuring exam and workup without report of mechanical fall or injury.  She reports some pain that she localizes to the right lumbar sacral junction.  Hip exam is overall benign and reassuring.  Plain hip films placed in triage did not show any acute hip arthropathy but do show well-established lumbar DDD.  Patient will be discharged home with instructions to go home meds including pain medicines and muscle relaxants as prescribed. Patient is to follow up with her PCP as discussed, as needed or otherwise directed. Patient is given ED precautions to return to the ED for any worsening or new symptoms.  FINAL CLINICAL IMPRESSION(S) / ED DIAGNOSES   Final diagnoses:  Lumbar strain, initial encounter     Rx / DC Orders   ED Discharge Orders     None        Note:  This document was prepared using Dragon voice recognition software and may include unintentional dictation errors.    Lissa Hoard, PA-C 01/26/23 1525    Jene Every, MD 01/26/23 1620

## 2023-01-28 ENCOUNTER — Ambulatory Visit: Payer: Self-pay | Admitting: *Deleted

## 2023-01-28 NOTE — Patient Outreach (Signed)
  Care Coordination   Follow Up Visit Note   01/28/2023 Name: Nicole Austin MRN: [id number] DOB: 1960-09-05  Nicole Austin is a 62 y.o. year old female who sees Danelle Berry, [place] for primary care. I spoke with  Nicole Austin by phone today.  What matters to the patients health and wellness today?  Seen in the ED last week, diagnosed with lumbar strain. Denies falling, report the pain started with general movement.  Denies any urgent concerns, encouraged to contact this care manager with questions.      Goals Addressed             This Visit's Progress    Effective management of chronic medical conditions   On track    Interventions Today    Flowsheet Row Most Recent Value  Chronic Disease   Chronic disease during today's visit Chronic Obstructive Pulmonary Disease (COPD), Other  [recovery from broken toe, new lumbar strain]  General Interventions   General Interventions Discussed/Reviewed General Interventions Reviewed, Doctor Visits  [foot/broken toe now healed]  Doctor Visits Discussed/Reviewed Doctor Visits Reviewed, PCP, Specialist  [ortho scheduled for 12/5, has pulmonary on 12/3, but state she will call to reschedule as she does not have transportation.]  PCP/Specialist Visits Compliance with follow-up visit  Education Interventions   Education Provided Provided Education  Provided Verbal Education On Medication, When to see the doctor  [has new cough/congestion, feels it may be sinus infection.  Sputumis clear/white]              SDOH assessments and interventions completed:  No     Care Coordination Interventions:  Yes, provided   Follow up plan: Follow up call scheduled for 1/7    Encounter Outcome:  Patient Visit Completed   Nicole Langton, RN, Nicole Austin, Nicole Austin [place]  [facility], [facility] RN Care Coordinator Direct Dial: [phone] / Main [phone] Fax [phone] Email:  [email or web address]

## 2023-01-29 ENCOUNTER — Ambulatory Visit: Payer: Self-pay | Admitting: *Deleted

## 2023-01-29 ENCOUNTER — Ambulatory Visit: Payer: 59 | Attending: Pulmonary Disease

## 2023-01-29 ENCOUNTER — Ambulatory Visit: Payer: 59 | Admitting: Pulmonary Disease

## 2023-01-29 NOTE — Telephone Encounter (Signed)
Summary: requesting medication for a stopped up nose.   Patient called and states that her nose is stopped up, she states it will run some but she can not blow anything out. Patient states her throat is dry and she has a dry cough but more concerned about her nose and she is requesting medication to make her nose drain so she can get rid of it.  Please advise.  Patients callback #  [phone]        Called patient to review sx of nasal stuffiness, cough, dry throat and medication request. No answer. VM full , unable to leave message to call back .

## 2023-01-29 NOTE — Telephone Encounter (Signed)
Reason for Disposition  Common cold with no complications  Answer Assessment - Initial Assessment Questions 1. ONSET: "When did the nasal discharge start?"      My sinuses want to run and I have the sniffles.   When I blow nothing comes out.   It feels like it's stopped up.   It's pressure in my nose.   I have a dry cough.    I can't get nothing up.    I didn't know what I could take.    2. AMOUNT: "How much discharge is there?"      It's stopped up and then it's runny 3. COUGH: "Do you have a cough?" If Yes, ask: "Describe the color of your sputum" (clear, white, yellow, green)     Dry cough 4. RESPIRATORY DISTRESS: "Describe your breathing."      Fine 5. FEVER: "Do you have a fever?" If Yes, ask: "What is your temperature, how was it measured, and when did it start?"     fever 6. SEVERITY: "Overall, how bad are you feeling right now?" (e.g., doesn't interfere with normal activities, staying home from school/work, staying in bed)      I'm feeling fine.    7. OTHER SYMPTOMS: "Do you have any other symptoms?" (e.g., sore throat, earache, wheezing, vomiting)     Dry cough. 8. PREGNANCY: "Is there any chance you are pregnant?" "When was your last menstrual period?"     N/A due to age  Protocols used: Common Cold-A-AH  Chief Complaint: Nasal congestion and dry cough Symptoms: Nose feels stopped up then it's runny.   Having sinus pressure. Frequency: not asked Pertinent Negatives: Patient denies fever or feeling bad.   "Just my nose is stopped up and then runny sometimes".    Disposition: [] ED /[] Urgent Care (no appt availability in office) / [] Appointment(In office/virtual)/ []  [facility]/ [x] Home Care/ [] Refused Recommended Disposition /[] [facility] [place] [facility]/ []  Follow-up with PCP Additional Notes: Went over home care advice.   Suggested she get some nasal saline spray which she was agreeable to doing and using the Mucinex DM for congestion.

## 2023-02-01 ENCOUNTER — Encounter: Payer: Self-pay | Admitting: Physician Assistant

## 2023-02-01 ENCOUNTER — Ambulatory Visit: Payer: 59 | Admitting: Physician Assistant

## 2023-02-01 VITALS — BP 122/82 | HR 92 | Temp 97.9°F | Resp 16 | Ht 61.0 in | Wt 225.0 lb

## 2023-02-01 DIAGNOSIS — R6883 Chills (without fever): Secondary | ICD-10-CM

## 2023-02-01 DIAGNOSIS — R6889 Other general symptoms and signs: Secondary | ICD-10-CM

## 2023-02-01 DIAGNOSIS — R0602 Shortness of breath: Secondary | ICD-10-CM | POA: Diagnosis not present

## 2023-02-01 DIAGNOSIS — J441 Chronic obstructive pulmonary disease with (acute) exacerbation: Secondary | ICD-10-CM

## 2023-02-01 DIAGNOSIS — J029 Acute pharyngitis, unspecified: Secondary | ICD-10-CM | POA: Diagnosis not present

## 2023-02-01 LAB — POCT RAPID STREP A (OFFICE): Rapid Strep A Screen: NEGATIVE

## 2023-02-01 LAB — POCT INFLUENZA A/B
Influenza A, POC: NEGATIVE
Influenza B, POC: NEGATIVE

## 2023-02-01 MED ORDER — AMOXICILLIN-POT CLAVULANATE 875-125 MG PO TABS: 1.0000 | ORAL_TABLET | Freq: Two times a day (BID) | ORAL | 0 refills | Status: DC

## 2023-02-01 MED ORDER — PREDNISONE 20 MG PO TABS: ORAL_TABLET | ORAL | 0 refills | Status: DC

## 2023-02-01 NOTE — Patient Instructions (Signed)
I am concerned that you are experiencing a COPD exacerbation and potential pneumonia I have sent in a script for Augmentin to be taken by mouth twice per day for 10 days FINISH THE ENTIRE COURSE UNLESS YOU ARE INSTRUCTED TO STOP BY A PROVIDER OR DEVELOP AN ALLERGIC REACTION  I have sent in a script for Prednisone taper to be taken in the morning with breakfast per the instructions on the container Remember that steroids can cause sleeplessness, irritability, increased hunger and elevated glucose levels so be mindful of these side effects. They should lessen as you progress to the lower doses of the taper.  Please use your home oxygen to help with your breathing and continue to use your inhalers as directed  If you notice fever, increased chills, increased shortness of breath and trouble breathing, productive cough, chest pain, oxygen levels less than 95%  - GO TO THE ED IMMEDIATELY for evaluation.

## 2023-02-01 NOTE — Progress Notes (Unsigned)
Acute Office Visit   Patient: Nicole Austin   DOB: Mar 23, 1960   62 y.o. Female  MRN: [id number] Visit Date: 02/01/2023  Today's healthcare provider: Oswaldo Conroy Lumir Demetriou, PA-C  Introduced myself to the patient as a Secondary school teacher and provided education on APPs in clinical practice.    Chief Complaint  Patient presents with   Cough   Wheezing   Shortness of Breath   Subjective    HPI    URI -type symptoms   Onset: gradual - started with sore throat and then progressed to current symptoms  Duration: ongoing for a few days  Associated symptoms: dry cough, nasal congestion, SOB (states she feels starved for air),    Intervention: Theraflu, Mucinex DM, cough drops   Recent sick contacts: her best friend who lives with her has been sick as well  Recent travel: none  COVID testing at home: she has not tested at home   Result: NA   She has been using her Albuterol inhaler 1-2 times per day but she is sleeping a lot She states she usually does not need it much     Medications: Outpatient Medications Prior to Visit  Medication Sig   ALPRAZolam (XANAX) 0.5 MG tablet Take 1 tablet (0.5 mg total) by mouth 2 (two) times daily.   chlorhexidine (HIBICLENS) 4 % external liquid Apply 15 mLs (1 Application total) topically as directed for 30 doses. Use as directed daily for 5 days every other week for 6 weeks.   divalproex (DEPAKOTE) 250 MG DR tablet Take 1 tablet (250 mg total) by mouth 2 (two) times daily.   escitalopram (LEXAPRO) 20 MG tablet Take 20 mg by mouth daily.   furosemide (LASIX) 20 MG tablet Take 0.5-1 tablets (10-20 mg total) by mouth daily as needed for fluid or edema. Take in am with potassium PRN   Lurasidone HCl (LATUDA) 60 MG TABS Take 1 tablet (60 mg total) by mouth daily with breakfast.   Multiple Vitamins-Minerals (MULTIVITAMIN WITH MINERALS) tablet Take 1 tablet by mouth daily.   naloxone (NARCAN) nasal spray 4 mg/0.1 mL Spray into nostril with signs of opioid  related oversedation or overdose   nystatin (MYCOSTATIN/NYSTOP) powder Apply 1 Application topically 3 (three) times daily. (Patient taking differently: Apply 1 Application topically 3 (three) times daily as needed (irritation).)   oxyCODONE (OXY IR/ROXICODONE) 5 MG immediate release tablet Take 1 tablet (5 mg total) by mouth every 4 (four) hours as needed for severe pain.   oxyCODONE-acetaminophen (PERCOCET) 5-325 MG tablet Take 1 tablet by mouth every 8 (eight) hours as needed for severe pain (pain score 7-10).   polyethylene glycol (MIRALAX / GLYCOLAX) 17 g packet Take 17 g by mouth 2 (two) times daily.   pregabalin (LYRICA) 25 MG capsule Take 2 capsules (50 mg total) by mouth 2 (two) times daily.   rOPINIRole (REQUIP XL) 4 MG 24 hr tablet Take 4 mg by mouth at bedtime.   senna-docusate (SENOKOT-S) 8.6-50 MG tablet Take 1 tablet by mouth 2 (two) times daily.   [DISCONTINUED] cefadroxil (DURICEF) 500 MG capsule Take 500 mg by mouth 2 (two) times daily.   No facility-administered medications prior to visit.    Review of Systems  Constitutional:  Positive for chills and fatigue. Negative for fever.  HENT:  Positive for congestion, sinus pressure, sinus pain and sore throat. Negative for ear pain and postnasal drip.   Respiratory:  Positive for cough, shortness of breath  and wheezing.   Gastrointestinal:  Positive for diarrhea. Negative for nausea and vomiting.  Musculoskeletal:  Positive for myalgias.  Neurological:  Positive for headaches.        Objective    BP 122/82   Pulse 92   Temp 97.9 F (36.6 C) (Oral)   Resp 16   Ht 5\' 1"  (1.549 m)   Wt 225 lb (102.1 kg)   LMP  (LMP Unknown)   SpO2 92%   BMI 42.51 kg/m     Physical Exam Vitals reviewed.  Constitutional:      General: She is awake. She is in acute distress.     Appearance: She is well-developed. She is ill-appearing and diaphoretic.  HENT:     Head: Normocephalic and atraumatic.  Cardiovascular:     Rate and  Rhythm: Normal rate and regular rhythm.     Pulses:          Radial pulses are 2+ on the right side and 2+ on the left side.     Heart sounds: Heart sounds are distant.  Pulmonary:     Effort: Tachypnea and respiratory distress present.     Breath sounds: Transmitted upper airway sounds present. Wheezing and rhonchi present.  Neurological:     Mental Status: She is alert.  Psychiatric:        Behavior: Behavior is cooperative.       Results for orders placed or performed in visit on 02/01/23  POCT rapid strep A  Result Value Ref Range   Rapid Strep A Screen Negative Negative  POCT Influenza A/B  Result Value Ref Range   Influenza A, POC Negative Negative   Influenza B, POC Negative Negative    Assessment & Plan      No follow-ups on file.     Problem List Items Addressed This Visit       Respiratory   COPD (chronic obstructive pulmonary disease) (HCC) - Primary    Chronic, ongoing Suspect acute exacerbation likely secondary to viral URI Will provide Augmentin p.o. twice daily x 10 days along with prednisone taper to assist with symptoms.  Recommend she continues current inhaler regimen as directed Flu and rapid strep were both negative.  Results were discussed with patient during appointment. Will also check for COVID.  Results to dictate further management Reviewed ED and return precautions Follow-up as needed for progressing or persistent symptoms      Relevant Medications   amoxicillin-clavulanate (AUGMENTIN) 875-125 MG tablet   predniSONE (DELTASONE) 20 MG tablet   Other Visit Diagnoses     Flu-like symptoms       Relevant Orders   POCT rapid strep A (Completed)   POCT Influenza A/B (Completed)   Novel Coronavirus, NAA ([facility])   Sore throat       Relevant Orders   POCT rapid strep A (Completed)   Novel Coronavirus, NAA ([facility])        No follow-ups on file.   I, Media Pizzini E Rhodesia Stanger, PA-C, have reviewed all documentation for this visit. The  documentation on 02/05/23 for the exam, diagnosis, procedures, and orders are all accurate and complete.   Jacquelin Hawking, MHS, PA-C [facility]

## 2023-02-04 DIAGNOSIS — J441 Chronic obstructive pulmonary disease with (acute) exacerbation: Secondary | ICD-10-CM | POA: Diagnosis not present

## 2023-02-04 DIAGNOSIS — J029 Acute pharyngitis, unspecified: Secondary | ICD-10-CM | POA: Diagnosis not present

## 2023-02-04 DIAGNOSIS — J449 Chronic obstructive pulmonary disease, unspecified: Secondary | ICD-10-CM | POA: Diagnosis not present

## 2023-02-04 DIAGNOSIS — R6889 Other general symptoms and signs: Secondary | ICD-10-CM | POA: Diagnosis not present

## 2023-02-05 NOTE — Assessment & Plan Note (Signed)
Chronic, ongoing Suspect acute exacerbation likely secondary to viral URI Will provide Augmentin p.o. twice daily x 10 days along with prednisone taper to assist with symptoms.  Recommend she continues current inhaler regimen as directed Flu and rapid strep were both negative.  Results were discussed with patient during appointment. Will also check for COVID.  Results to dictate further management Reviewed ED and return precautions Follow-up as needed for progressing or persistent symptoms

## 2023-02-06 ENCOUNTER — Emergency Department
Admission: EM | Admit: 2023-02-06 | Discharge: 2023-02-06 | Disposition: A | Discharge status: Home / Self Care | Payer: 59 | Source: Home / Self Care | Attending: Emergency Medicine | Admitting: Emergency Medicine

## 2023-02-06 ENCOUNTER — Emergency Department: Payer: 59

## 2023-02-06 ENCOUNTER — Other Ambulatory Visit: Payer: Self-pay

## 2023-02-06 DIAGNOSIS — M25462 Effusion, left knee: Secondary | ICD-10-CM | POA: Diagnosis not present

## 2023-02-06 DIAGNOSIS — X501XXA Overexertion from prolonged static or awkward postures, initial encounter: Secondary | ICD-10-CM | POA: Insufficient documentation

## 2023-02-06 DIAGNOSIS — J449 Chronic obstructive pulmonary disease, unspecified: Secondary | ICD-10-CM | POA: Diagnosis not present

## 2023-02-06 DIAGNOSIS — R6889 Other general symptoms and signs: Secondary | ICD-10-CM | POA: Diagnosis not present

## 2023-02-06 DIAGNOSIS — Z96652 Presence of left artificial knee joint: Secondary | ICD-10-CM | POA: Diagnosis not present

## 2023-02-06 DIAGNOSIS — J441 Chronic obstructive pulmonary disease with (acute) exacerbation: Secondary | ICD-10-CM | POA: Diagnosis not present

## 2023-02-06 DIAGNOSIS — W19XXXA Unspecified fall, initial encounter: Secondary | ICD-10-CM | POA: Diagnosis not present

## 2023-02-06 DIAGNOSIS — G20C Parkinsonism, unspecified: Secondary | ICD-10-CM | POA: Insufficient documentation

## 2023-02-06 DIAGNOSIS — S8392XA Sprain of unspecified site of left knee, initial encounter: Secondary | ICD-10-CM | POA: Insufficient documentation

## 2023-02-06 DIAGNOSIS — Z8673 Personal history of transient ischemic attack (TIA), and cerebral infarction without residual deficits: Secondary | ICD-10-CM | POA: Insufficient documentation

## 2023-02-06 DIAGNOSIS — M25562 Pain in left knee: Secondary | ICD-10-CM | POA: Diagnosis not present

## 2023-02-06 DIAGNOSIS — J45909 Unspecified asthma, uncomplicated: Secondary | ICD-10-CM | POA: Insufficient documentation

## 2023-02-06 DIAGNOSIS — S80919A Unspecified superficial injury of unspecified knee, initial encounter: Secondary | ICD-10-CM | POA: Diagnosis not present

## 2023-02-06 LAB — NOVEL CORONAVIRUS, NAA: SARS-CoV-2, NAA: DETECTED — AB

## 2023-02-06 LAB — SPECIMEN STATUS REPORT

## 2023-02-06 MED ORDER — OXYCODONE-ACETAMINOPHEN 5-325 MG PO TABS
1.0000 | ORAL_TABLET | Freq: Once | ORAL | Status: AC
  Administered 2023-02-06: 1 via ORAL
  Filled 2023-02-06: qty 1

## 2023-02-06 NOTE — ED Provider Notes (Signed)
[facility] Provider Note    Event Date/Time   First MD Initiated Contact with Patient 02/06/23 1133     (approximate)   History   Knee Pain (Left/)   HPI  Nicole Austin is a 62 y.o. female with history of polysubstance abuse, Parkinson's, bipolar, asthma, CVA, hyperlipidemia presents emergency department after twisting her knee on her sister's porch last night.  No numbness or tingling.  No other injuries.      Physical Exam   Triage Vital Signs: ED Triage Vitals  Encounter Vitals Group     BP 02/06/23 1039 136/77     Systolic BP Percentile --      Diastolic BP Percentile --      Pulse Rate 02/06/23 1039 85     Resp 02/06/23 1039 19     Temp 02/06/23 1039 97.8 F (36.6 C)     Temp Source 02/06/23 1039 Oral     SpO2 02/06/23 1039 93 %     Weight 02/06/23 1133 224 lb 13.9 oz (102 kg)     Height 02/06/23 1133 5\' 1"  (1.549 m)     Head Circumference --      Peak Flow --      Pain Score 02/06/23 1038 10     Pain Loc --      Pain Education --      Exclude from Growth Chart --     Most recent vital signs: Vitals:   02/06/23 1039 02/06/23 1131  BP: 136/77 (!) 140/76  Pulse: 85 79  Resp: 19   Temp: 97.8 F (36.6 C) 97.8 F (36.6 C)  SpO2: 93% 97%     General: Awake, no distress.   CV:  Good peripheral perfusion. regular rate and  rhythm Resp:  Normal effort. Abd:  No distention.   Other:  Left knee tender to palpation, patient does have ability to move the knee but states it is painful.  Neurovascular intact   ED Results / Procedures / Treatments   Labs (all labs ordered are listed, but only abnormal results are displayed) Labs Reviewed - No data to display   EKG     RADIOLOGY X-ray of the left knee    PROCEDURES:   Procedures   MEDICATIONS ORDERED IN ED: Medications  oxyCODONE-acetaminophen (PERCOCET/ROXICET) 5-325 MG per tablet 1 tablet (1 tablet Oral Given 02/06/23 1201)     IMPRESSION / MDM /  ASSESSMENT AND PLAN / ED COURSE  I reviewed the triage vital signs and the nursing notes.                              Differential diagnosis includes, but is not limited to, sprain, fracture, contusion  Patient's presentation is most consistent with acute complicated illness / injury requiring diagnostic workup.   X-ray left knee was independently reviewed interpreted by me as being negative for any acute abnormality, confirmed by radiology  Did explain the findings to the patient.  She is to follow-up with her regular doctor.  Return if worsening.  We did place an Ace wrap on the patient.   Patient is in agreement treatment plan.  Discharged stable condition.   FINAL CLINICAL IMPRESSION(S) / ED DIAGNOSES   Final diagnoses:  Sprain of left knee, unspecified ligament, initial encounter     Rx / DC Orders   ED Discharge Orders     None  Note:  This document was prepared using Dragon voice recognition software and may include unintentional dictation errors.    Faythe Ghee, PA-C 02/06/23 1210    Trinna Post, MD 02/06/23 [phone]

## 2023-02-06 NOTE — Progress Notes (Signed)
Your COVID testing was positive. At this time you are outside the window for Paxlovid therapy. Please continue with the measures discussed during your apt.

## 2023-02-06 NOTE — ED Triage Notes (Signed)
Pt to ED via [facility] from home. Pt reports left sided knee pain after last pm. Pt reports slipped and fell. Pt denies LOC. Pt denies blood thinners. Pt reports pain 10/10. Pt walks with cane.   Ems VS:  BP 164/88 HR 100 96% RA

## 2023-02-06 NOTE — ED Notes (Signed)
Basics and blue top sent down with save tube labels.

## 2023-02-06 NOTE — ED Notes (Signed)
This tech, and orientee, Alexia, double wrapped pt left knee with ACE wrap.

## 2023-02-07 ENCOUNTER — Telehealth: Payer: Self-pay

## 2023-02-07 NOTE — Transitions of Care (Post Inpatient/ED Visit) (Signed)
02/07/2023  Name: Nicole Austin MRN: [id number] DOB: 11-27-1960  Today's TOC FU Call Status: Today's TOC FU Call Status:: Successful TOC FU Call Completed TOC FU Call Complete Date: 02/06/23 Patient's Name and Date of Birth confirmed.  Transition Care Management Follow-up Telephone Call Date of Discharge: 02/06/23 Discharge Facility: [facility]) Type of Discharge: Emergency Department Reason for ED Visit: Other: (sprain knee) How have you been since you were released from the hospital?: Same Any questions or concerns?: No  Items Reviewed: Did you receive and understand the discharge instructions provided?: Yes Medications obtained,verified, and reconciled?: Yes (Medications Reviewed) Any new allergies since your discharge?: No Dietary orders reviewed?: NA Do you have support at home?: No  Medications Reviewed Today: Medications Reviewed Today     Reviewed by Karena Addison, LPN (Licensed Practical Nurse) on 02/07/23 at 1110  Med List Status: <None>   Medication Order Taking? Sig Documenting Provider Last Dose Status Informant  ALPRAZolam (XANAX) 0.5 MG tablet [id number] No Take 1 tablet (0.5 mg total) by mouth 2 (two) times daily. Alford Highland, MD Taking Active Self  amoxicillin-clavulanate (AUGMENTIN) 875-125 MG tablet [id number]  Take 1 tablet by mouth 2 (two) times daily. Mecum, Erin E, PA-C  Active   chlorhexidine (HIBICLENS) 4 % external liquid [id number] No Apply 15 mLs (1 Application total) topically as directed for 30 doses. Use as directed daily for 5 days every other week for 6 weeks. Cassandria Anger, PA-C Taking Active   divalproex (DEPAKOTE) 250 MG DR tablet [id number] No Take 1 tablet (250 mg total) by mouth 2 (two) times daily. Brandy Hale, MD Taking Active Self  escitalopram (LEXAPRO) 20 MG tablet [id number] No Take 20 mg by mouth daily. [provider] Taking Active Self  furosemide (LASIX) 20 MG tablet [id number] No  Take 0.5-1 tablets (10-20 mg total) by mouth daily as needed for fluid or edema. Take in am with potassium PRN Danelle Berry, PA-C Taking Active Self  Lurasidone HCl (LATUDA) 60 MG TABS [id number] No Take 1 tablet (60 mg total) by mouth daily with breakfast. Alford Highland, MD Taking Active Self  Multiple Vitamins-Minerals (MULTIVITAMIN WITH MINERALS) tablet [id number] No Take 1 tablet by mouth daily. [provider] Taking Active Self  naloxone [facility]) nasal spray 4 mg/0.1 mL [id number] No Spray into nostril with signs of opioid related oversedation or overdose Cassandria Anger, PA-C Taking Active   nystatin (MYCOSTATIN/NYSTOP) powder [id number] No Apply 1 Application topically 3 (three) times daily.  Patient taking differently: Apply 1 Application topically 3 (three) times daily as needed (irritation).   Margarita Mail, DO Taking Active Self  oxyCODONE (OXY IR/ROXICODONE) 5 MG immediate release tablet [id number] No Take 1 tablet (5 mg total) by mouth every 4 (four) hours as needed for severe pain. Cassandria Anger, PA-C Taking Active   oxyCODONE-acetaminophen (PERCOCET) 5-325 MG tablet [id number] No Take 1 tablet by mouth every 8 (eight) hours as needed for severe pain (pain score 7-10). Felecia Shelling, DPM Taking Active   polyethylene glycol (MIRALAX / GLYCOLAX) 17 g packet [id number] No Take 17 g by mouth 2 (two) times daily. Cassandria Anger, PA-C Taking Active   predniSONE (DELTASONE) 20 MG tablet [id number]  Take 60mg  PO daily x 2 days, then40mg  PO daily x 2 days, then 20mg  PO daily x 3 days Mecum, Erin E, PA-C  Active   pregabalin (LYRICA) 25 MG capsule [id number] No Take 2 capsules (50 mg total) by mouth 2 (two) times daily.  Mecum, Erin E, PA-C Taking Active   rOPINIRole (REQUIP XL) 4 MG 24 hr tablet [id number] No Take 4 mg by mouth at bedtime. [provider] Taking Active Self  senna-docusate (SENOKOT-S) 8.6-50 MG tablet [id number] No Take 1 tablet by mouth 2 (two) times  daily. Marrion Coy, MD Taking Active Self            Home Care and Equipment/Supplies: Were Home Health Services Ordered?: NA Any new equipment or medical supplies ordered?: NA  Functional Questionnaire: Do you need assistance with bathing/showering or dressing?: No Do you need assistance with meal preparation?: No Do you need assistance with eating?: No Do you have difficulty maintaining continence: No Do you need assistance with getting out of bed/getting out of a chair/moving?: No Do you have difficulty managing or taking your medications?: No  Follow up appointments reviewed: PCP Follow-up appointment confirmed?: Yes Date of PCP follow-up appointment?: 02/08/23 Follow-up Provider: [facility] Follow-up appointment confirmed?: NA Do you need transportation to your follow-up appointment?: No Do you understand care options if your condition(s) worsen?: Yes-patient verbalized understanding    SIGNATURE Karena Addison, LPN [facility] Nurse Health Advisor Direct Dial [phone]

## 2023-02-08 ENCOUNTER — Inpatient Hospital Stay
Admission: EM | Admit: 2023-02-08 | Discharge: 2023-02-11 | DRG: 177 | Disposition: A | Discharge status: Home / Self Care | Payer: 59 | Attending: Internal Medicine | Admitting: Internal Medicine

## 2023-02-08 ENCOUNTER — Emergency Department: Payer: 59

## 2023-02-08 ENCOUNTER — Ambulatory Visit: Payer: 59 | Admitting: Physician Assistant

## 2023-02-08 ENCOUNTER — Ambulatory Visit: Payer: Self-pay | Admitting: *Deleted

## 2023-02-08 ENCOUNTER — Other Ambulatory Visit: Payer: Self-pay

## 2023-02-08 DIAGNOSIS — S8392XA Sprain of unspecified site of left knee, initial encounter: Secondary | ICD-10-CM | POA: Diagnosis not present

## 2023-02-08 DIAGNOSIS — J9601 Acute respiratory failure with hypoxia: Secondary | ICD-10-CM

## 2023-02-08 DIAGNOSIS — F1910 Other psychoactive substance abuse, uncomplicated: Secondary | ICD-10-CM | POA: Diagnosis present

## 2023-02-08 DIAGNOSIS — Z79899 Other long term (current) drug therapy: Secondary | ICD-10-CM

## 2023-02-08 DIAGNOSIS — M810 Age-related osteoporosis without current pathological fracture: Secondary | ICD-10-CM | POA: Diagnosis present

## 2023-02-08 DIAGNOSIS — Z836 Family history of other diseases of the respiratory system: Secondary | ICD-10-CM | POA: Diagnosis not present

## 2023-02-08 DIAGNOSIS — Z96652 Presence of left artificial knee joint: Secondary | ICD-10-CM | POA: Diagnosis present

## 2023-02-08 DIAGNOSIS — E871 Hypo-osmolality and hyponatremia: Secondary | ICD-10-CM | POA: Diagnosis not present

## 2023-02-08 DIAGNOSIS — R0989 Other specified symptoms and signs involving the circulatory and respiratory systems: Secondary | ICD-10-CM | POA: Diagnosis not present

## 2023-02-08 DIAGNOSIS — Z820 Family history of epilepsy and other diseases of the nervous system: Secondary | ICD-10-CM | POA: Diagnosis not present

## 2023-02-08 DIAGNOSIS — F418 Other specified anxiety disorders: Secondary | ICD-10-CM | POA: Diagnosis present

## 2023-02-08 DIAGNOSIS — E66813 Obesity, class 3: Secondary | ICD-10-CM | POA: Diagnosis present

## 2023-02-08 DIAGNOSIS — Z6841 Body Mass Index (BMI) 40.0 and over, adult: Secondary | ICD-10-CM | POA: Diagnosis not present

## 2023-02-08 DIAGNOSIS — Z720 Tobacco use: Secondary | ICD-10-CM | POA: Diagnosis not present

## 2023-02-08 DIAGNOSIS — Z23 Encounter for immunization: Secondary | ICD-10-CM

## 2023-02-08 DIAGNOSIS — F209 Schizophrenia, unspecified: Secondary | ICD-10-CM | POA: Diagnosis present

## 2023-02-08 DIAGNOSIS — E785 Hyperlipidemia, unspecified: Secondary | ICD-10-CM | POA: Diagnosis not present

## 2023-02-08 DIAGNOSIS — Z888 Allergy status to other drugs, medicaments and biological substances status: Secondary | ICD-10-CM

## 2023-02-08 DIAGNOSIS — J441 Chronic obstructive pulmonary disease with (acute) exacerbation: Principal | ICD-10-CM | POA: Diagnosis present

## 2023-02-08 DIAGNOSIS — J9621 Acute and chronic respiratory failure with hypoxia: Secondary | ICD-10-CM | POA: Diagnosis not present

## 2023-02-08 DIAGNOSIS — Z96642 Presence of left artificial hip joint: Secondary | ICD-10-CM | POA: Diagnosis present

## 2023-02-08 DIAGNOSIS — Z8616 Personal history of COVID-19: Secondary | ICD-10-CM | POA: Diagnosis not present

## 2023-02-08 DIAGNOSIS — G2581 Restless legs syndrome: Secondary | ICD-10-CM | POA: Diagnosis not present

## 2023-02-08 DIAGNOSIS — F319 Bipolar disorder, unspecified: Secondary | ICD-10-CM | POA: Diagnosis not present

## 2023-02-08 DIAGNOSIS — F17290 Nicotine dependence, other tobacco product, uncomplicated: Secondary | ICD-10-CM | POA: Diagnosis not present

## 2023-02-08 DIAGNOSIS — Z818 Family history of other mental and behavioral disorders: Secondary | ICD-10-CM | POA: Diagnosis not present

## 2023-02-08 DIAGNOSIS — Z8489 Family history of other specified conditions: Secondary | ICD-10-CM

## 2023-02-08 DIAGNOSIS — F1290 Cannabis use, unspecified, uncomplicated: Secondary | ICD-10-CM | POA: Diagnosis present

## 2023-02-08 DIAGNOSIS — Z811 Family history of alcohol abuse and dependence: Secondary | ICD-10-CM

## 2023-02-08 DIAGNOSIS — F4310 Post-traumatic stress disorder, unspecified: Secondary | ICD-10-CM | POA: Diagnosis present

## 2023-02-08 DIAGNOSIS — D72829 Elevated white blood cell count, unspecified: Secondary | ICD-10-CM | POA: Diagnosis present

## 2023-02-08 DIAGNOSIS — M797 Fibromyalgia: Secondary | ICD-10-CM | POA: Diagnosis present

## 2023-02-08 DIAGNOSIS — Z825 Family history of asthma and other chronic lower respiratory diseases: Secondary | ICD-10-CM

## 2023-02-08 DIAGNOSIS — Z8249 Family history of ischemic heart disease and other diseases of the circulatory system: Secondary | ICD-10-CM | POA: Diagnosis not present

## 2023-02-08 DIAGNOSIS — U071 COVID-19: Secondary | ICD-10-CM | POA: Diagnosis not present

## 2023-02-08 DIAGNOSIS — Z8673 Personal history of transient ischemic attack (TIA), and cerebral infarction without residual deficits: Secondary | ICD-10-CM

## 2023-02-08 DIAGNOSIS — G20A1 Parkinson's disease without dyskinesia, without mention of fluctuations: Secondary | ICD-10-CM | POA: Diagnosis not present

## 2023-02-08 DIAGNOSIS — G4489 Other headache syndrome: Secondary | ICD-10-CM | POA: Diagnosis not present

## 2023-02-08 DIAGNOSIS — G4730 Sleep apnea, unspecified: Secondary | ICD-10-CM | POA: Diagnosis present

## 2023-02-08 DIAGNOSIS — R062 Wheezing: Secondary | ICD-10-CM | POA: Diagnosis not present

## 2023-02-08 DIAGNOSIS — Z833 Family history of diabetes mellitus: Secondary | ICD-10-CM

## 2023-02-08 DIAGNOSIS — E222 Syndrome of inappropriate secretion of antidiuretic hormone: Secondary | ICD-10-CM | POA: Diagnosis present

## 2023-02-08 DIAGNOSIS — Z860100 Personal history of colon polyps, unspecified: Secondary | ICD-10-CM

## 2023-02-08 DIAGNOSIS — Z8614 Personal history of Methicillin resistant Staphylococcus aureus infection: Secondary | ICD-10-CM

## 2023-02-08 DIAGNOSIS — G629 Polyneuropathy, unspecified: Secondary | ICD-10-CM | POA: Diagnosis present

## 2023-02-08 DIAGNOSIS — I252 Old myocardial infarction: Secondary | ICD-10-CM

## 2023-02-08 LAB — CBC
HCT: 39.2 % (ref 36.0–46.0)
Hemoglobin: 12.6 g/dL (ref 12.0–15.0)
MCH: 27.5 pg (ref 26.0–34.0)
MCHC: 32.1 g/dL (ref 30.0–36.0)
MCV: 85.4 fL (ref 80.0–100.0)
Platelets: 341 10*3/uL (ref 150–400)
RBC: 4.59 MIL/uL (ref 3.87–5.11)
RDW: 16 % — ABNORMAL HIGH (ref 11.5–15.5)
WBC: 13.1 10*3/uL — ABNORMAL HIGH (ref 4.0–10.5)
nRBC: 0 % (ref 0.0–0.2)

## 2023-02-08 LAB — BASIC METABOLIC PANEL
Anion gap: 11 (ref 5–15)
BUN: 13 mg/dL (ref 8–23)
CO2: 27 mmol/L (ref 22–32)
Calcium: 8.9 mg/dL (ref 8.9–10.3)
Chloride: 94 mmol/L — ABNORMAL LOW (ref 98–111)
Creatinine, Ser: 1.01 mg/dL — ABNORMAL HIGH (ref 0.44–1.00)
GFR, Estimated: >60 mL/min (ref >60)
Glucose, Bld: 86 mg/dL (ref 70–99)
Potassium: 4.1 mmol/L (ref 3.5–5.1)
Sodium: 132 mmol/L — ABNORMAL LOW (ref 135–145)

## 2023-02-08 LAB — CBG MONITORING, ED
Glucose-Capillary: 156 mg/dL — ABNORMAL HIGH (ref 70–99)
Glucose-Capillary: 124 mg/dL — ABNORMAL HIGH (ref 70–99)

## 2023-02-08 LAB — TROPONIN I (HIGH SENSITIVITY): Troponin I (High Sensitivity): 3 ng/L (ref <18)

## 2023-02-08 LAB — PROCALCITONIN: Procalcitonin: <0.1 ng/mL

## 2023-02-08 MED ORDER — DEXAMETHASONE SODIUM PHOSPHATE 10 MG/ML IJ SOLN
6.0000 mg | Freq: Every day | INTRAMUSCULAR | Status: DC
  Administered 2023-02-09: 6 mg via INTRAVENOUS
  Filled 2023-02-08: qty 1

## 2023-02-08 MED ORDER — NICOTINE POLACRILEX 2 MG MT GUM: 2.0000 mg | CHEWING_GUM | OROMUCOSAL | Status: DC | PRN

## 2023-02-08 MED ORDER — POLYETHYLENE GLYCOL 3350 17 G PO PACK
17.0000 g | PACK | Freq: Two times a day (BID) | ORAL | Status: DC
Start: 2023-02-09 — End: 2023-02-11
  Administered 2023-02-09 – 2023-02-11 (×4): 17 g via ORAL
  Filled 2023-02-08 (×5): qty 1

## 2023-02-08 MED ORDER — IPRATROPIUM-ALBUTEROL 0.5-2.5 (3) MG/3ML IN SOLN
3.0000 mL | Freq: Four times a day (QID) | RESPIRATORY_TRACT | Status: AC
  Administered 2023-02-08 – 2023-02-09 (×3): 3 mL via RESPIRATORY_TRACT
  Filled 2023-02-08 (×3): qty 3

## 2023-02-08 MED ORDER — IPRATROPIUM-ALBUTEROL 0.5-2.5 (3) MG/3ML IN SOLN
3.0000 mL | Freq: Once | RESPIRATORY_TRACT | Status: AC
  Administered 2023-02-08: 3 mL via RESPIRATORY_TRACT
  Filled 2023-02-08: qty 3

## 2023-02-08 MED ORDER — DIVALPROEX SODIUM 500 MG PO DR TAB: 500.0000 mg | DELAYED_RELEASE_TABLET | Freq: Every day | ORAL | Status: DC

## 2023-02-08 MED ORDER — NICOTINE 21 MG/24HR TD PT24
21.0000 mg | MEDICATED_PATCH | Freq: Once | TRANSDERMAL | Status: AC
  Administered 2023-02-08: 21 mg via TRANSDERMAL
  Filled 2023-02-08: qty 1

## 2023-02-08 MED ORDER — PREGABALIN 50 MG PO CAPS
50.0000 mg | ORAL_CAPSULE | Freq: Two times a day (BID) | ORAL | Status: DC
  Administered 2023-02-09 – 2023-02-11 (×5): 50 mg via ORAL
  Filled 2023-02-08 (×5): qty 1

## 2023-02-08 MED ORDER — PREGABALIN 25 MG PO CAPS
25.0000 mg | ORAL_CAPSULE | Freq: Once | ORAL | Status: AC
  Administered 2023-02-08: 25 mg via ORAL
  Filled 2023-02-08: qty 1

## 2023-02-08 MED ORDER — DIVALPROEX SODIUM 250 MG PO DR TAB
250.0000 mg | DELAYED_RELEASE_TABLET | Freq: Every day | ORAL | Status: DC
  Administered 2023-02-09 – 2023-02-11 (×3): 250 mg via ORAL
  Filled 2023-02-08 (×3): qty 1

## 2023-02-08 MED ORDER — ONDANSETRON HCL 4 MG/2ML IJ SOLN: 4.0000 mg | Freq: Four times a day (QID) | INTRAMUSCULAR | Status: DC | PRN

## 2023-02-08 MED ORDER — ACETAMINOPHEN 650 MG RE SUPP: 650.0000 mg | Freq: Four times a day (QID) | RECTAL | Status: DC | PRN

## 2023-02-08 MED ORDER — OXYCODONE HCL 5 MG PO TABS
5.0000 mg | ORAL_TABLET | Freq: Once | ORAL | Status: AC
  Administered 2023-02-08: 5 mg via ORAL
  Filled 2023-02-08: qty 1

## 2023-02-08 MED ORDER — METHYLPREDNISOLONE SODIUM SUCC 125 MG IJ SOLR
125.0000 mg | Freq: Once | INTRAMUSCULAR | Status: AC
  Administered 2023-02-08: 125 mg via INTRAVENOUS
  Filled 2023-02-08: qty 2

## 2023-02-08 MED ORDER — INSULIN ASPART 100 UNIT/ML IJ SOLN: 0.0000 [IU] | Freq: Every day | INTRAMUSCULAR | Status: DC

## 2023-02-08 MED ORDER — DIVALPROEX SODIUM 500 MG PO DR TAB
500.0000 mg | DELAYED_RELEASE_TABLET | Freq: Every day | ORAL | Status: DC
  Administered 2023-02-08 – 2023-02-10 (×3): 500 mg via ORAL
  Filled 2023-02-08 (×3): qty 1

## 2023-02-08 MED ORDER — ALPRAZOLAM 0.5 MG PO TABS
0.5000 mg | ORAL_TABLET | Freq: Two times a day (BID) | ORAL | Status: DC | PRN
  Administered 2023-02-08 – 2023-02-11 (×5): 0.5 mg via ORAL
  Filled 2023-02-08 (×5): qty 1

## 2023-02-08 MED ORDER — ONDANSETRON HCL 4 MG PO TABS
4.0000 mg | ORAL_TABLET | Freq: Four times a day (QID) | ORAL | Status: DC | PRN
  Administered 2023-02-08: 4 mg via ORAL
  Filled 2023-02-08: qty 1

## 2023-02-08 MED ORDER — LURASIDONE HCL 20 MG PO TABS
60.0000 mg | ORAL_TABLET | Freq: Every day | ORAL | Status: DC
  Administered 2023-02-08: 60 mg via ORAL
  Filled 2023-02-08 (×2): qty 3

## 2023-02-08 MED ORDER — ENOXAPARIN SODIUM 60 MG/0.6ML IJ SOSY
0.5000 mg/kg | PREFILLED_SYRINGE | INTRAMUSCULAR | Status: DC
  Administered 2023-02-08 – 2023-02-10 (×3): 50 mg via SUBCUTANEOUS
  Filled 2023-02-08 (×3): qty 0.6

## 2023-02-08 MED ORDER — NICOTINE 21 MG/24HR TD PT24: 21.0000 mg | MEDICATED_PATCH | Freq: Every day | TRANSDERMAL | Status: DC | PRN

## 2023-02-08 MED ORDER — ENOXAPARIN SODIUM 40 MG/0.4ML IJ SOSY: 40.0000 mg | PREFILLED_SYRINGE | INTRAMUSCULAR | Status: DC

## 2023-02-08 MED ORDER — SENNOSIDES-DOCUSATE SODIUM 8.6-50 MG PO TABS: 1.0000 | ORAL_TABLET | Freq: Every evening | ORAL | Status: DC | PRN

## 2023-02-08 MED ORDER — INSULIN ASPART 100 UNIT/ML IJ SOLN
0.0000 [IU] | Freq: Three times a day (TID) | INTRAMUSCULAR | Status: DC
  Administered 2023-02-08: 3 [IU] via SUBCUTANEOUS
  Administered 2023-02-09: 2 [IU] via SUBCUTANEOUS
  Administered 2023-02-10: 3 [IU] via SUBCUTANEOUS
  Administered 2023-02-10 – 2023-02-11 (×2): 2 [IU] via SUBCUTANEOUS
  Filled 2023-02-08 (×5): qty 1

## 2023-02-08 MED ORDER — ROPINIROLE HCL 1 MG PO TABS
4.0000 mg | ORAL_TABLET | Freq: Every day | ORAL | Status: DC
  Administered 2023-02-08 – 2023-02-10 (×3): 4 mg via ORAL
  Filled 2023-02-08 (×4): qty 4

## 2023-02-08 MED ORDER — ACETAMINOPHEN 325 MG PO TABS
650.0000 mg | ORAL_TABLET | Freq: Four times a day (QID) | ORAL | Status: DC | PRN
  Administered 2023-02-08 – 2023-02-10 (×7): 650 mg via ORAL
  Filled 2023-02-08 (×7): qty 2

## 2023-02-08 MED ORDER — SENNOSIDES-DOCUSATE SODIUM 8.6-50 MG PO TABS
1.0000 | ORAL_TABLET | Freq: Two times a day (BID) | ORAL | Status: DC
  Administered 2023-02-09 – 2023-02-11 (×4): 1 via ORAL
  Filled 2023-02-08 (×6): qty 1

## 2023-02-08 MED ORDER — ESCITALOPRAM OXALATE 10 MG PO TABS
20.0000 mg | ORAL_TABLET | Freq: Every day | ORAL | Status: DC
  Administered 2023-02-09 – 2023-02-11 (×3): 20 mg via ORAL
  Filled 2023-02-08 (×3): qty 2

## 2023-02-08 NOTE — H&P (Addendum)
History and Physical   Nicole Austin ZHY:[id number] DOB: May 31, 1960 DOA: 02/08/2023  PCP: Danelle Berry, PA-C  Patient coming from: Home via EMS  I have personally briefly reviewed patient's old medical records in [facility] EMR.  Chief Concern: Shortness of breath  HPI: Nicole Austin is a 62 year old female with history of COPD with chronic 2 L nasal cannula requirements, anxiety, onychomycosis of both feet toenails, chronic back pain, bilateral lower extremity swelling and edema, anxiety, bipolar with depression, history of gout, morbid obesity, history of stroke, history of dysphagia, who presents emergency department for chief concerns of cough, wheezing and shortness of breath.  Vitals in the ED showed temperature of 97.9, respiration rate 20, heart rate 75, blood pressure 129/91, SpO2 91% on 2 L nasal cannula.  Serum sodium is 132, potassium 4.1, chloride 94, bicarb 27, BUN 13, serum creatinine 1.01, EGFR greater than 60, nonfasting blood glucose 82, WBC 13.1, hemoglobin 12.6, platelets of 341.  High sensitive troponin is 3.  ED treatment: DuoNebs one-time dose, Solu-Medrol 125 mg IV one-time dose.  Of note, patient tested positive for COVID-19 on 02/04/2023. ------------------------------- At bedside, patient able to tell me her name, age, location, current calendar year.  She reports that she has been having shortness of breath since early this month.  She reports that she tested positive for COVID-19 on 9 December.  She denies trauma to her person.  She endorses compliance with oxygen use.  She reports that she vapes every day.  She reports that she does not smoke cigarettes however she does smoke marijuana every day.  She denies EtOH and recreational drug use.  She endorses chills and nonproductive cough.  She reports she has difficulty getting sputum up.  She denies nausea, vomiting, fever, dysuria, hematuria, diarrhea, swelling of her lower extremities, syncope, loss  of consciousness.  She reports she has chest discomfort that has been ongoing for several years and is unchanged.  She reports sometimes the pain is worse especially when she coughs.  Social history: She lives at home and cares for her sister who has emphysema.  She endorses daily vaping of nicotine products and marijuana smoking.  ROS: Constitutional: no weight change, no fever ENT/Mouth: no sore throat, no rhinorrhea Eyes: no eye pain, no vision changes Cardiovascular: + chest pain, + dyspnea,  no edema, no palpitations Respiratory: no cough, no sputum, no wheezing Gastrointestinal: no nausea, no vomiting, no diarrhea, no constipation Genitourinary: no urinary incontinence, no dysuria, no hematuria Musculoskeletal: no arthralgias, no myalgias Skin: no skin lesions, no pruritus, Neuro: + weakness, no loss of consciousness, no syncope Psych: no anxiety, no depression, + decrease appetite Heme/Lymph: no bruising, no bleeding  ED Course: Discussed with EDP, patient requiring hospitalization for chief concerns of COPD exacerbation.  Assessment/Plan  Principal Problem:   COPD exacerbation (HCC) Active Problems:   Polypharmacy   Fibromyalgia   HLD (hyperlipidemia)   Bipolar disorder with depression (HCC)   Depression with anxiety   Polysubstance abuse (HCC)   OP (osteoporosis)   Neuropathy, peripheral   Sleep apnea   Tobacco abuse   Obesity, Class III, BMI 40-49.9 (morbid obesity) (HCC)   S/P total left hip arthroplasty   COVID-19 virus infection   Assessment and Plan:  * COPD exacerbation (HCC) Maintain SpO2 greater than 92% Continuous pulse symmetry Status post 1 treatment of DuoNeb's in the ED Continue with duo nebulizer 4 times daily, 3 doses ordered on admission Dispo Solu-Medrol 125 mg IV one-time dose in the  ED Continue with Decadron 6 mg IV daily, starting on 02/09/2023, 6 days ordered Admit to telemetry medical, inpatient  Fibromyalgia PDMP reviewed: Patient  currently has active prescription for alprazolam 0.5 mg tablet, 120 tablet for 30 days filled on 02/04/2023; pregabalin 25 mg, 120 tablets for 30 days filled on 01/25/2023.  Depression with anxiety Home alprazolam 0.5 mg p.o. twice daily as needed for anxiety resumed on admission  Bipolar disorder with depression (HCC) Resumed home Depakote 250 mg p.o. twice daily however after discussion with pharmacy, patient has been prescribed Depakote 250 mg tablet, 1 tablet morning and 2 tablets in the evening.  And this prescription has been unchanged for many years per Pharmacy. Depakote 250 mg, 1 tablet in the morning and 2 tablets in the evening resumed on admission  COVID-19 virus infection With acute hypoxia requiring increased oxygen supplementation from baseline Patient tested positive for COVID-19 on 02/04/2023 Continue airborne and contact precaution Decadron 6 mg IV daily, 6 days ordered; to complete 7-day course of IV steroids  Obesity, Class III, BMI 40-49.9 (morbid obesity) (HCC) This complicates overall care and prognosis.   Tobacco abuse Greater than 5 minutes spent on counseling with patient regarding tobacco cessation especially with vaping as this can cause acute life threatening danger including collapsed lung Patient endorses understanding and compliance As needed nicotine patch/nicotine gum ordered  Sleep apnea CPAP mask was recommended and offered, patient adamantly declined  Neuropathy, peripheral Home pregabalin 60 mg p.o. twice daily resumed  Chart reviewed.   DVT prophylaxis: Enoxaparin Code Status: Full code Diet: Heart healthy Family Communication: Updated sister over the phone Disposition Plan: Pending clinical course Consults called: None at this time Admission status: Telemetry medical, inpatient  Past Medical History:  Diagnosis Date   ADHD (attention deficit hyperactivity disorder)    Apnea, sleep 06/30/2015   Arthritis    Asthma    Asthma with acute  exacerbation 06/30/2015   Benign neoplasm of ascending colon    Bipolar 1 disorder (HCC)    Chronic pain    COPD, moderate (HCC) 11/18/2013   Depression    DOE (dyspnea on exertion)    Fall 07/04/2022   GERD (gastroesophageal reflux disease) 08/30/2015   Gout 08/16/2014   History of acute myocardial infarction 06/30/2015   History of panic attacks    HPV (human papilloma virus) infection 07/17/2013   Hyperlipidemia    Left knee pain 07/06/2022   Low HDL (under 40) 08/30/2015   MI (myocardial infarction) (HCC) 2018   MRSA infection 2023   groin abscess   Nose colonized with MRSA 03/21/2022   a.) PCR (+) prior to LEFT THA   Osteoporosis    Parkinson disease (HCC)    Polyp of sigmoid colon    Pre-diabetes    Prediabetes 05/20/2016   PTSD (post-traumatic stress disorder)    Restless leg syndrome    Rhabdomyolysis    Sleep apnea 06/30/2015   Stage 3 severe COPD by GOLD classification (HCC) 11/18/2013   Stroke (HCC) 2018   right arm weakness   Traumatic rhabdomyolysis (HCC) 07/04/2022   Tremors of nervous system    hands   Uncomplicated asthma 06/30/2015   Varicella 02/25/2017   Past Surgical History:  Procedure Laterality Date   BACK SURGERY     lumbar   CATARACT EXTRACTION W/PHACO Right 01/01/2019   Procedure: CATARACT EXTRACTION PHACO AND INTRAOCULAR LENS PLACEMENT (IOC) right vision blue;  Surgeon: Elliot Cousin, MD;  Location: [facility] ORS;  Service: Ophthalmology;  Laterality: Right;  [place] 00:39.4 CDE 5.49 Fluid Pack lot # [id number]   CATARACT EXTRACTION W/PHACO Left 01/29/2019   Procedure: CATARACT EXTRACTION PHACO AND INTRAOCULAR LENS PLACEMENT (IOC) LEFT Vision Blue;  Surgeon: Elliot Cousin, MD;  Location: [facility] ORS;  Service: Ophthalmology;  Laterality: Left;  [place] 00:51.1 CDE 4.38 Fluid Pack Lot # [id number]   COLONOSCOPY WITH PROPOFOL N/A 08/07/2017   Procedure: COLONOSCOPY WITH PROPOFOL;  Surgeon: Pasty Spillers, MD;  Location: [facility] ENDOSCOPY;  Service: Endoscopy;   Laterality: N/A;   HAND SURGERY Left    fractured with pins   REPLACEMENT TOTAL KNEE Left 2016   SPINAL CORD STIMULATOR INSERTION  2014   SPINAL CORD STIMULATOR REMOVAL  2014   TOTAL HIP ARTHROPLASTY Left 10/09/2022   Procedure: TOTAL HIP ARTHROPLASTY ANTERIOR APPROACH;  Surgeon: Durene Romans, MD;  Location: [facility];  Service: Orthopedics;  Laterality: Left;   TUBAL LIGATION     Social History:  reports that she quit smoking about 10 months ago. Her smoking use included cigarettes. She started smoking about 36 years ago. She has a 18 pack-year smoking history. She has been exposed to tobacco smoke. She has never used smokeless tobacco. She reports current drug use. Drug: Marijuana. She reports that she does not drink alcohol.  Allergies  Allergen Reactions   Chantix [Varenicline] Nausea Only   Glycopyrrolate Rash    Oral irritation   Family History  Problem Relation Age of Onset   Hernia Mother    Heart disease Mother    OCD Mother    Diabetes Mother    Parkinson's disease Father    Bipolar disorder Sister    Schizophrenia Sister    ADD / ADHD Sister    Alcohol abuse Brother    Bipolar disorder Sister    Paranoid behavior Sister    ADD / ADHD Sister    ADD / ADHD Son    Dementia Maternal Grandmother    Emphysema Maternal Grandfather    ADD / ADHD Son    ADD / ADHD Son    Depression Son    Family history: Family history reviewed and not pertinent.  Prior to Admission medications   Medication Sig Start Date End Date Taking? Authorizing Provider  ALPRAZolam Prudy Feeler) 0.5 MG tablet Take 1 tablet (0.5 mg total) by mouth 2 (two) times daily. 07/10/22   Alford Highland, MD  amoxicillin-clavulanate (AUGMENTIN) 875-125 MG tablet Take 1 tablet by mouth 2 (two) times daily. 02/01/23   Mecum, Erin E, PA-C  chlorhexidine (HIBICLENS) 4 % external liquid Apply 15 mLs (1 Application total) topically as directed for 30 doses. Use as directed daily for 5 days every other week for 6 weeks.  10/09/22   Cassandria Anger, PA-C  divalproex (DEPAKOTE) 250 MG DR tablet Take 1 tablet (250 mg total) by mouth 2 (two) times daily. 11/19/16   Brandy Hale, MD  escitalopram (LEXAPRO) 20 MG tablet Take 20 mg by mouth daily.    [provider]  furosemide (LASIX) 20 MG tablet Take 0.5-1 tablets (10-20 mg total) by mouth daily as needed for fluid or edema. Take in am with potassium PRN 08/06/22   Danelle Berry, PA-C  Lurasidone HCl (LATUDA) 60 MG TABS Take 1 tablet (60 mg total) by mouth daily with breakfast. 07/10/22   Alford Highland, MD  Multiple Vitamins-Minerals (MULTIVITAMIN WITH MINERALS) tablet Take 1 tablet by mouth daily.    [provider]  naloxone [facility]) nasal spray 4 mg/0.1 mL Spray into nostril with signs of opioid  related oversedation or overdose 10/10/22   Cassandria Anger, PA-C  nystatin (MYCOSTATIN/NYSTOP) powder Apply 1 Application topically 3 (three) times daily. Patient taking differently: Apply 1 Application topically 3 (three) times daily as needed (irritation). 06/20/22   Margarita Mail, DO  oxyCODONE (OXY IR/ROXICODONE) 5 MG immediate release tablet Take 1 tablet (5 mg total) by mouth every 4 (four) hours as needed for severe pain. 10/10/22   Cassandria Anger, PA-C  oxyCODONE-acetaminophen (PERCOCET) 5-325 MG tablet Take 1 tablet by mouth every 8 (eight) hours as needed for severe pain (pain score 7-10). 12/27/22   Felecia Shelling, DPM  polyethylene glycol (MIRALAX / GLYCOLAX) 17 g packet Take 17 g by mouth 2 (two) times daily. 10/10/22   Cassandria Anger, PA-C  predniSONE (DELTASONE) 20 MG tablet Take 60mg  PO daily x 2 days, then40mg  PO daily x 2 days, then 20mg  PO daily x 3 days 02/01/23   Mecum, Erin E, PA-C  pregabalin (LYRICA) 25 MG capsule Take 2 capsules (50 mg total) by mouth 2 (two) times daily. 01/23/23   Mecum, Erin E, PA-C  rOPINIRole (REQUIP XL) 4 MG 24 hr tablet Take 4 mg by mouth at bedtime.    [provider]  senna-docusate  (SENOKOT-S) 8.6-50 MG tablet Take 1 tablet by mouth 2 (two) times daily. 04/10/22   Marrion Coy, MD   Physical Exam: Vitals:   02/08/23 1057 02/08/23 1059  BP:  (!) 129/91  Pulse:  75  Resp:  20  Temp:  97.9 F (36.6 C)  SpO2:  91%  Weight: 102.1 kg   Height: 5\' 1"  (1.549 m)    Constitutional: appears older than chronological age, chronically ill, NAD, calm Eyes: PERRL, lids and conjunctivae normal ENMT: Mucous membranes are moist. Posterior pharynx clear of any exudate or lesions. Age-appropriate dentition. Hearing appropriate Neck: normal, supple, no masses, no thyromegaly Respiratory: clear to auscultation bilaterally, no wheezing, no crackles.  Increased respiratory effort.  Increased accessory muscle use.  Cardiovascular: Regular rate and rhythm, no murmurs / rubs / gallops. No extremity edema. 2+ pedal pulses. No carotid bruits.  Abdomen: Morbidly obese abdomen, no tenderness, no masses palpated, no hepatosplenomegaly. Bowel sounds positive.  Musculoskeletal: no clubbing / cyanosis. No joint deformity upper and lower extremities. Good ROM, no contractures, no atrophy. Normal muscle tone.  Skin: no rashes, lesions, ulcers. No induration Neurologic: Sensation intact. Strength 5/5 in all 4.  Psychiatric: Normal judgment and insight. Alert and oriented x 3. Normal mood.   EKG: independently reviewed, showing sinus rhythm with rate 67, QTc 450  Chest x-ray on Admission: I personally reviewed and I agree with radiologist reading as below.  DG Chest 2 View Result Date: 02/08/2023 CLINICAL DATA:  Wheezing.  Congestion. EXAM: CHEST - 2 VIEW COMPARISON:  07/04/2022. FINDINGS: Bilateral lung fields are clear. Bilateral costophrenic angles are clear. Normal cardio-mediastinal silhouette. No acute osseous abnormalities. The soft tissues are within normal limits. IMPRESSION: No active cardiopulmonary disease. Electronically Signed   By: Jules Schick M.D.   On: 02/08/2023 12:23   Labs on  Admission: I have personally reviewed following labs  CBC: Recent Labs  Lab 02/08/23 1105  WBC 13.1*  HGB 12.6  HCT 39.2  MCV 85.4  PLT 341   Basic Metabolic Panel: Recent Labs  Lab 02/08/23 1105  NA 132*  K 4.1  CL 94*  CO2 27  GLUCOSE 86  BUN 13  CREATININE 1.01*  CALCIUM 8.9   GFR: Estimated Creatinine Clearance: 62.6 mL/min (  A) (by C-G formula based on SCr of 1.01 mg/dL (H)).  Urine analysis:    Component Value Date/Time   COLORURINE YELLOW (A) 08/06/2022 2029   APPEARANCEUR HAZY (A) 08/06/2022 2029   APPEARANCEUR Clear 06/15/2014 0241   LABSPEC 1.014 08/06/2022 2029   LABSPEC 1.024 06/15/2014 0241   PHURINE 7.0 08/06/2022 2029   GLUCOSEU NEGATIVE 08/06/2022 2029   GLUCOSEU Negative 06/15/2014 0241   HGBUR NEGATIVE 08/06/2022 2029   BILIRUBINUR NEGATIVE 08/06/2022 2029   BILIRUBINUR Negative 06/15/2014 0241   KETONESUR NEGATIVE 08/06/2022 2029   PROTEINUR NEGATIVE 08/06/2022 2029   NITRITE POSITIVE (A) 08/06/2022 2029   LEUKOCYTESUR SMALL (A) 08/06/2022 2029   LEUKOCYTESUR Negative 06/15/2014 0241   This document was prepared using Dragon Voice Recognition software and may include unintentional dictation errors.  Dr. Sedalia Muta [facility]  If 7PM-7AM, please contact overnight-coverage provider If 7AM-7PM, please contact day attending provider [email or web address]  02/08/2023, 4:31 PM

## 2023-02-08 NOTE — Progress Notes (Signed)
[facility] progress note  Received message from nursing that patient is requesting for her home Flexeril as her legs are cramping.  Responded to nursing that patient: Pt is having COPD exacerbation and should not be given a muscle relaxer as this can stop her diaphram from working and stop her breathing.  # COPD exacerbation-avoid muscle relaxer on admission and in general in patients with COPD -1 additional dose of pregabalin 25 mg ordered -Continue pregabalin 50 mg p.o. twice daily - Ropinirole 4 mg nightly - Flexeril not to be ordered in setting of COPD exacerbation  Dr. Sedalia Muta

## 2023-02-08 NOTE — ED Notes (Signed)
Called dietary at this time, states the lunch tray should be here shortly.

## 2023-02-08 NOTE — Progress Notes (Signed)
PHARMACIST - PHYSICIAN COMMUNICATION  CONCERNING:  Enoxaparin (Lovenox) for DVT Prophylaxis    RECOMMENDATION: Patient was prescribed enoxaprin 40mg  q24 hours for VTE prophylaxis.   Filed Weights   02/08/23 1057  Weight: 102.1 kg (225 lb)    Body mass index is 42.51 kg/m.  Estimated Creatinine Clearance: 62.6 mL/min (A) (by C-G formula based on SCr of 1.01 mg/dL (H)).   Based on [facility] policy patient is candidate for enoxaparin 0.5mg /kg TBW SQ every 24 hours based on BMI being >30.   DESCRIPTION: Pharmacy has adjusted enoxaparin dose per [facility] policy.  Patient is now receiving enoxaparin 0.5 mg/kg every 24 hours    Lowella Bandy, PharmD Clinical Pharmacist  02/08/2023 12:56 PM

## 2023-02-08 NOTE — Assessment & Plan Note (Signed)
Patient presenting with rapidly worsening shortness of breath with associated wheezing Physical exam most consistent with COPD exacerbation with notable wheezing and prolonged expiratory phase Initiating aggressive bronchodilator therapy with intravenous Solu-Medrol initially followed by daily prednisone Supplemental oxygen initiated with target oxygen saturations of between 89 to 92%.  Patient is typically on 2 L of oxygen at baseline. Etiology is likely concurrent COVID-19 infection which is being treated with steroids and Paxlovid.

## 2023-02-08 NOTE — Telephone Encounter (Signed)
  Chief Complaint: SOB with chest discomfort wheezing Symptoms: see above. Unable to breath through nose  Frequency: today  Pertinent Negatives: Patient denies na  Disposition: [x] ED /[] Urgent Care (no appt availability in office) / [] Appointment(In office/virtual)/ []  [place] [facility]/ [] Home Care/ [] Refused Recommended Disposition /[] [place]/ []  Follow-up with PCP Additional Notes:   Worsening SOB and chest discomfort can not breath through nose. Recommended ED and if ride needed call 911. Will cancel appt for this afternoon.     Reason for Disposition  [1] MODERATE difficulty breathing (e.g., speaks in phrases, SOB even at rest, pulse 100-120) AND [2] NEW-onset or WORSE than normal  Answer Assessment - Initial Assessment Questions 1. RESPIRATORY STATUS: "Describe your breathing?" (e.g., wheezing, shortness of breath, unable to speak, severe coughing)      Shortness of breath nasal passages blocked, chest discomfort, wheezing  2. ONSET: "When did this breathing problem begin?"      Na  3. PATTERN "Does the difficult breathing come and go, or has it been constant since it started?"      Can not breath through nose and wears O2 at 2L/min [place] 4. SEVERITY: "How bad is your breathing?" (e.g., mild, moderate, severe)    - MILD: No SOB at rest, mild SOB with walking, speaks normally in sentences, can lie down, no retractions, pulse < 100.    - MODERATE: SOB at rest, SOB with minimal exertion and prefers to sit, cannot lie down flat, speaks in phrases, mild retractions, audible wheezing, pulse 100-120.    - SEVERE: Very SOB at rest, speaks in single words, struggling to breathe, sitting hunched forward, retractions, pulse > 120      SOB at rest 5. RECURRENT SYMPTOM: "Have you had difficulty breathing before?" If Yes, ask: "When was the last time?" and "What happened that time?"      Yes currently taking antibiotics 6. CARDIAC HISTORY: "Do you have any history of heart  disease?" (e.g., heart attack, angina, bypass surgery, angioplasty)      See hx  7. LUNG HISTORY: "Do you have any history of lung disease?"  (e.g., pulmonary embolus, asthma, emphysema)     Yes wears O2 8. CAUSE: "What do you think is causing the breathing problem?"      Sinus congestion 9. OTHER SYMPTOMS: "Do you have any other symptoms? (e.g., dizziness, runny nose, cough, chest pain, fever)     Stuffy nose can not breath through nose wears O2.  10. O2 SATURATION MONITOR:  "Do you use an oxygen saturation monitor (pulse oximeter) at home?" If Yes, ask: "What is your reading (oxygen level) today?" "What is your usual oxygen saturation reading?" (e.g., 95%)       na 11. PREGNANCY: "Is there any chance you are pregnant?" "When was your last menstrual period?"       na 12. TRAVEL: "Have you traveled out of the country in the last month?" (e.g., travel history, exposures)       na  Protocols used: Breathing Difficulty-A-AH

## 2023-02-08 NOTE — Hospital Course (Signed)
[name] is a 62 year old female with history of COPD with chronic 2 L nasal cannula requirements, anxiety, onychomycosis of both feet toenails, chronic back pain, bilateral lower extremity swelling and edema, anxiety, bipolar with depression, history of gout, morbid obesity, history of stroke, history of dysphagia, who presents emergency department for chief concerns of cough, wheezing and shortness of breath.  Vitals in the ED showed temperature of 97.9, respiration rate 20, heart rate 75, blood pressure 129/91, SpO2 91% on 2 L nasal cannula.  Serum sodium is 132, potassium 4.1, chloride 94, bicarb 27, BUN 13, serum creatinine 1.01, EGFR greater than 60, nonfasting blood glucose 82, WBC 13.1, hemoglobin 12.6, platelets of 341.  High sensitive troponin is 3.  ED treatment: DuoNebs one-time dose, Solu-Medrol 125 mg IV one-time dose.  Of note, patient tested positive for COVID-19 on 02/04/2023.

## 2023-02-08 NOTE — ED Notes (Signed)
Patient reports nausea.  Provided with PRN medication and crackers.  Will continue to monitor

## 2023-02-08 NOTE — Assessment & Plan Note (Addendum)
Resumed home Depakote 250 mg p.o. twice daily however after discussion with pharmacy, patient has been prescribed Depakote 250 mg tablet, 1 tablet morning and 2 tablets in the evening.  And this prescription has been unchanged for many years per Pharmacy. Depakote 250 mg, 1 tablet in the morning and 2 tablets in the evening resumed on admission

## 2023-02-08 NOTE — ED Notes (Signed)
Set up pt lunch tray

## 2023-02-08 NOTE — Assessment & Plan Note (Signed)
PDMP reviewed: Patient currently has active prescription for alprazolam 0.5 mg tablet, 120 tablet for 30 days filled on 02/04/2023; pregabalin 25 mg, 120 tablets for 30 days filled on 01/25/2023.

## 2023-02-08 NOTE — ED Triage Notes (Signed)
Pt from home via [facility] with reports of Congestion and HA x 1 week.

## 2023-02-08 NOTE — ED Triage Notes (Addendum)
Reports congestion and no energy for one week. Tested positive covid 12/9 Cough noted. Wears 2 L [place] at home Wheezing noted.  Increased to 3 LNC in triage to maintain SpO2 >92%

## 2023-02-08 NOTE — ED Provider Notes (Signed)
[facility] Provider Note    Event Date/Time   First MD Initiated Contact with Patient 02/08/23 1137     (approximate)   History   Nasal Congestion   HPI  Nicole Austin is a 62 y.o. female with history of COPD, COVID on 2 L at baseline who reports being sick for the past 7 days.  She was seen on 12/6 and I reviewed her note where her point-of-care testing was all negative and she had a send out testing that ended up being positive for COVID.  She was treated as a COPD exacerbation with steroids, antibiotics.  She reports that she took all of these and has not felt any better.  She reports significant more wheezing than baseline increasing shortness of breath with exertion.  At rest she was satting 90% on her normal 2 L of but had to be bumped up to 3 L.  She denies any swelling in her legs.   Physical Exam   Triage Vital Signs: ED Triage Vitals  Encounter Vitals Group     BP 02/08/23 1059 (!) 129/91     Systolic BP Percentile --      Diastolic BP Percentile --      Pulse Rate 02/08/23 1059 75     Resp 02/08/23 1059 20     Temp 02/08/23 1059 97.9 F (36.6 C)     Temp src --      SpO2 02/08/23 1059 91 %     Weight 02/08/23 1057 225 lb (102.1 kg)     Height 02/08/23 1057 5\' 1"  (1.549 m)     Head Circumference --      Peak Flow --      Pain Score 02/08/23 1057 5     Pain Loc --      Pain Education --      Exclude from Growth Chart --     Most recent vital signs: Vitals:   02/08/23 1059  BP: (!) 129/91  Pulse: 75  Resp: 20  Temp: 97.9 F (36.6 C)  SpO2: 91%     General: Awake, no distress.  CV:  Good peripheral perfusion.  Resp:  Normal effort.  Diffuse wheezing noted Abd:  No distention.  Other:  No swelling in legs.  No calf tenderness   ED Results / Procedures / Treatments   Labs (all labs ordered are listed, but only abnormal results are displayed) Labs Reviewed  CBC - Abnormal; Notable for the following components:       Result Value   WBC 13.1 (*)    RDW 16.0 (*)    All other components within normal limits  BASIC METABOLIC PANEL - Abnormal; Notable for the following components:   Sodium 132 (*)    Chloride 94 (*)    Creatinine, Ser 1.01 (*)    All other components within normal limits  TROPONIN I (HIGH SENSITIVITY)     EKG  My interpretation of EKG:  Normal sinus rate 67 without any ST elevation, T wave version, normal intervals  RADIOLOGY I have reviewed the xray personally and interpreted and no pneumonia  PROCEDURES:  Critical Care performed: Yes, see critical care procedure note(s)  .Critical Care  Performed by: Concha Se, MD Authorized by: Concha Se, MD   Critical care provider statement:    Critical care time (minutes):  30   Critical care was necessary to treat or prevent imminent or life-threatening deterioration of the following conditions:  Respiratory failure   Critical care was time spent personally by me on the following activities:  Development of treatment plan with patient or surrogate, discussions with consultants, evaluation of patient's response to treatment, examination of patient, ordering and review of laboratory studies, ordering and review of radiographic studies, ordering and performing treatments and interventions, pulse oximetry, re-evaluation of patient's condition and review of old charts    MEDICATIONS ORDERED IN ED: Medications  ipratropium-albuterol (DUONEB) 0.5-2.5 (3) MG/3ML nebulizer solution 3 mL (has no administration in time range)  ipratropium-albuterol (DUONEB) 0.5-2.5 (3) MG/3ML nebulizer solution 3 mL (has no administration in time range)  ipratropium-albuterol (DUONEB) 0.5-2.5 (3) MG/3ML nebulizer solution 3 mL (has no administration in time range)  methylPREDNISolone sodium succinate (SOLU-MEDROL) 125 mg/2 mL injection 125 mg (has no administration in time range)     IMPRESSION / MDM / ASSESSMENT AND PLAN / ED COURSE  I reviewed  the triage vital signs and the nursing notes.   Patient's presentation is most consistent with severe exacerbation of chronic illness.   Patient comes in with worsening shortness of breath significantly wheezing on examination need to be bumped up on her oxygen with known COVID.  Suspect this is a COPD exacerbation related to the COVID.  Out of the window for Paxlovid.  Troponin added to evaluate for any evidence of myocarditis.  BMP shows slightly elevated creatinine her white count is elevated however there are otherwise she does not meet sepsis criteria and she has known COVID.  X-ray without evidence of any pneumonia.  I will discuss with hospitalist for admission given acute on chronic respiratory failure and significant wheezing and shortness of breath with ambulation.  The patient is on the cardiac monitor to evaluate for evidence of arrhythmia and/or significant heart rate changes.      FINAL CLINICAL IMPRESSION(S) / ED DIAGNOSES   Final diagnoses:  COPD exacerbation (HCC)  COVID-19  Acute respiratory failure with hypoxia (HCC)     Rx / DC Orders   ED Discharge Orders     None        Note:  This document was prepared using Dragon voice recognition software and may include unintentional dictation errors.   Concha Se, MD 02/08/23 [phone]

## 2023-02-08 NOTE — Assessment & Plan Note (Addendum)
Patient is being counseled daily on smoking cessation. As needed nicotine patch/nicotine gum ordered

## 2023-02-08 NOTE — Assessment & Plan Note (Signed)
CPAP mask was recommended and offered, patient adamantly declined

## 2023-02-08 NOTE — Assessment & Plan Note (Signed)
-   This complicates overall care and prognosis.  

## 2023-02-08 NOTE — Assessment & Plan Note (Addendum)
With acute hypoxia requiring increased oxygen supplementation from baseline Patient tested positive for COVID-19 on 02/04/2023 Continue airborne and contact precaution Decadron 6 mg IV daily, 6 days ordered; to complete 7-day course of IV steroids

## 2023-02-08 NOTE — Assessment & Plan Note (Signed)
Home pregabalin 60 mg p.o. twice daily resumed

## 2023-02-08 NOTE — Assessment & Plan Note (Addendum)
Home alprazolam 0.5 mg p.o. twice daily as needed for anxiety resumed on admission

## 2023-02-09 DIAGNOSIS — M797 Fibromyalgia: Secondary | ICD-10-CM | POA: Diagnosis not present

## 2023-02-09 DIAGNOSIS — Z79899 Other long term (current) drug therapy: Secondary | ICD-10-CM

## 2023-02-09 DIAGNOSIS — J441 Chronic obstructive pulmonary disease with (acute) exacerbation: Secondary | ICD-10-CM | POA: Diagnosis not present

## 2023-02-09 DIAGNOSIS — F418 Other specified anxiety disorders: Secondary | ICD-10-CM

## 2023-02-09 DIAGNOSIS — F319 Bipolar disorder, unspecified: Secondary | ICD-10-CM

## 2023-02-09 DIAGNOSIS — U071 COVID-19: Secondary | ICD-10-CM

## 2023-02-09 DIAGNOSIS — E871 Hypo-osmolality and hyponatremia: Secondary | ICD-10-CM

## 2023-02-09 DIAGNOSIS — Z720 Tobacco use: Secondary | ICD-10-CM

## 2023-02-09 LAB — CBC
HCT: 35.6 % — ABNORMAL LOW (ref 36.0–46.0)
Hemoglobin: 11.8 g/dL — ABNORMAL LOW (ref 12.0–15.0)
MCH: 27.1 pg (ref 26.0–34.0)
MCHC: 33.1 g/dL (ref 30.0–36.0)
MCV: 81.8 fL (ref 80.0–100.0)
Platelets: 327 10*3/uL (ref 150–400)
RBC: 4.35 MIL/uL (ref 3.87–5.11)
RDW: 15.9 % — ABNORMAL HIGH (ref 11.5–15.5)
WBC: 10.3 10*3/uL (ref 4.0–10.5)
nRBC: 0 % (ref 0.0–0.2)

## 2023-02-09 LAB — BASIC METABOLIC PANEL
Anion gap: 12 (ref 5–15)
BUN: 21 mg/dL (ref 8–23)
CO2: 25 mmol/L (ref 22–32)
Calcium: 8.6 mg/dL — ABNORMAL LOW (ref 8.9–10.3)
Chloride: 89 mmol/L — ABNORMAL LOW (ref 98–111)
Creatinine, Ser: 0.95 mg/dL (ref 0.44–1.00)
GFR, Estimated: >60 mL/min (ref >60)
Glucose, Bld: 192 mg/dL — ABNORMAL HIGH (ref 70–99)
Potassium: 3.8 mmol/L (ref 3.5–5.1)
Sodium: 126 mmol/L — ABNORMAL LOW (ref 135–145)

## 2023-02-09 LAB — OSMOLALITY, URINE: Osmolality, Ur: 567 mosm/kg (ref 300–900)

## 2023-02-09 LAB — GLUCOSE, CAPILLARY
Glucose-Capillary: 162 mg/dL — ABNORMAL HIGH (ref 70–99)
Glucose-Capillary: 145 mg/dL — ABNORMAL HIGH (ref 70–99)

## 2023-02-09 LAB — HEMOGLOBIN A1C
Hgb A1c MFr Bld: 5.9 % — ABNORMAL HIGH (ref 4.8–5.6)
Mean Plasma Glucose: 122.63 mg/dL

## 2023-02-09 LAB — CBG MONITORING, ED
Glucose-Capillary: 108 mg/dL — ABNORMAL HIGH (ref 70–99)
Glucose-Capillary: 117 mg/dL — ABNORMAL HIGH (ref 70–99)
Glucose-Capillary: 127 mg/dL — ABNORMAL HIGH (ref 70–99)

## 2023-02-09 LAB — CORTISOL: Cortisol, Plasma: 1.9 ug/dL

## 2023-02-09 LAB — OSMOLALITY: Osmolality: 279 mosm/kg (ref 275–295)

## 2023-02-09 LAB — SODIUM, URINE, RANDOM: Sodium, Ur: 134 mmol/L

## 2023-02-09 MED ORDER — PREDNISONE 50 MG PO TABS: 50.0000 mg | ORAL_TABLET | Freq: Every day | ORAL | Status: DC

## 2023-02-09 MED ORDER — INFLUENZA VIRUS VACC SPLIT PF (FLUZONE) 0.5 ML IM SUSY
0.5000 mL | PREFILLED_SYRINGE | INTRAMUSCULAR | Status: AC
  Administered 2023-02-10: 0.5 mL via INTRAMUSCULAR
  Filled 2023-02-09: qty 0.5

## 2023-02-09 MED ORDER — PNEUMOCOCCAL 20-VAL CONJ VACC 0.5 ML IM SUSY
0.5000 mL | PREFILLED_SYRINGE | INTRAMUSCULAR | Status: AC
  Administered 2023-02-10: 0.5 mL via INTRAMUSCULAR
  Filled 2023-02-09: qty 0.5

## 2023-02-09 MED ORDER — NIRMATRELVIR/RITONAVIR (PAXLOVID)TABLET: 3.0000 | ORAL_TABLET | Freq: Two times a day (BID) | ORAL | Status: DC

## 2023-02-09 MED ORDER — MELATONIN 5 MG PO TABS
10.0000 mg | ORAL_TABLET | Freq: Every evening | ORAL | Status: DC | PRN
  Administered 2023-02-09 – 2023-02-10 (×2): 10 mg via ORAL
  Filled 2023-02-09 (×2): qty 2

## 2023-02-09 MED ORDER — METHYLPREDNISOLONE SODIUM SUCC 125 MG IJ SOLR
0.5000 mg/kg | Freq: Two times a day (BID) | INTRAMUSCULAR | Status: DC
  Administered 2023-02-09 – 2023-02-11 (×4): 51.25 mg via INTRAVENOUS
  Filled 2023-02-09 (×4): qty 2

## 2023-02-09 MED ORDER — LURASIDONE HCL 40 MG PO TABS
60.0000 mg | ORAL_TABLET | Freq: Every day | ORAL | Status: DC
  Administered 2023-02-09 – 2023-02-10 (×2): 60 mg via ORAL
  Filled 2023-02-09 (×3): qty 2

## 2023-02-09 NOTE — Assessment & Plan Note (Addendum)
Seemingly chronic with baseline sodium levels approximate 128-129 Likely SIADH Obtaining urine sodium, urine osmolality, serum osmolality, TSH, cortisol Monitoring sodium levels with serial chemistries.

## 2023-02-09 NOTE — Progress Notes (Signed)
PROGRESS NOTE   Nicole Austin  WUJ:[id number] DOB: [birth year] DOA: 02/08/2023 PCP: Danelle Berry, PA-C   Date of Service: the patient was seen and examined on 02/09/2023  Brief Narrative:  62 year old female with history of COPD with chronic 2 L nasal cannula requirements, anxiety, onychomycosis of both feet toenails, chronic back pain, anxiety, bipolar disorder with depression, gout, morbid obesity, history of stroke, history of dysphagia, who presents to [facility] emergency department for chief concerns of cough, wheezing and shortness of breath.  Of note, patient has recently been diagnosed with COVID-19 on 12/9.  Upon evaluation in the emergency department patient was found to have a leukocytosis of 13.1.  Chest x-ray was unremarkable.  Patient was felt to be suffering from COPD exacerbation secondary to ongoing COVID-19 infection.  The hospitalist group was then called to assess the patient for admission to the hospital.  Patient was initiated on aggressive bronchodilator therapy and intravenous systemic steroids with intravenous Decadron.  Patient was placed on airborne precautions.   Assessment & Plan COPD exacerbation [facility]) Patient presenting with rapidly worsening shortness of breath with associated wheezing Physical exam most consistent with COPD exacerbation with notable wheezing and prolonged expiratory phase Initiating aggressive bronchodilator therapy with intravenous Solu-Medrol initially followed by daily prednisone Supplemental oxygen initiated with target oxygen saturations of between 89 to 92%.  Patient is typically on 2 L of oxygen at baseline. Etiology is likely concurrent COVID-19 infection which is being treated with steroids and Paxlovid.  COVID-19 virus infection Airborne and contact isolation initiated. Providing patient with supplemental oxygen for bouts of hypoxia  Patient has been initiated on a 5-day course of Paxlovid Patient has been initiated  on intravenous Solumedrol followed by Prednisone As needed bronchodilator therapy for shortness of breath and wheezing  As needed antitussives for cough   Hyponatremia Seemingly chronic with baseline sodium levels approximate 128-129 Likely SIADH Obtaining urine sodium, urine osmolality, serum osmolality, TSH, cortisol Monitoring sodium levels with serial chemistries. Bipolar disorder with depression (HCC) Depakote 250 mg, 1 tablet in the morning and 2 tablets in the evening resumed on admission Depression with anxiety Home alprazolam 0.5 mg p.o. twice daily  Neuropathy, peripheral Home pregabalin 60 mg p.o. twice daily resumed Tobacco abuse Patient is being counseled daily on smoking cessation. As needed nicotine patch/nicotine gum ordered Obesity, Class III, BMI 40-49.9 (morbid obesity) (HCC) We will counsel on cessation once clinically improved.     Subjective:  Patient is complaining of shortness of breath, moderate in intensity, worse with exertion and improved with rest.  Shortness breath is associated with generalized malaise, myalgias and weakness.  Physical Exam:  Vitals:   02/09/23 1300 02/09/23 1400 02/09/23 1500 02/09/23 1722  BP: (!) 132/59 (!) 148/70 (!) 133/110 (!) 121/58  Pulse: 74 71 66 76  Resp: 18 16 (!) 21 18  Temp:    97.8 F (36.6 C)  TempSrc:      SpO2: 99% 98% 97% 93%  Weight:      Height:        Constitutional: Awake alert and oriented x3, no associated distress.   Skin: no rashes, no lesions, good skin turgor noted. Eyes: Pupils are equally reactive to light.  No evidence of scleral icterus or conjunctival pallor.  ENMT: Moist mucous membranes noted.  Posterior pharynx clear of any exudate or lesions.   Respiratory: Intermittent expiratory wheezing noted with prolonged respiratory phase.  No associated rales.  Patient is slightly tachypneic without any evidence of accessory muscle use.  Cardiovascular: Regular rate and rhythm, no murmurs /  rubs / gallops. No extremity edema. 2+ pedal pulses. No carotid bruits.  Abdomen: Abdomen is soft and nontender.  No evidence of intra-abdominal masses.  Positive bowel sounds noted in all quadrants.   Musculoskeletal: No joint deformity upper and lower extremities. Good ROM, no contractures. Normal muscle tone.    Data Reviewed:  I have personally reviewed and interpreted labs, imaging.  Significant findings are   CBC: Recent Labs  Lab 02/08/23 1105 02/09/23 0226  WBC 13.1* 10.3  HGB 12.6 11.8*  HCT 39.2 35.6*  MCV 85.4 81.8  PLT 341 327   Basic Metabolic Panel: Recent Labs  Lab 02/08/23 1105 02/09/23 0226  NA 132* 126*  K 4.1 3.8  CL 94* 89*  CO2 27 25  GLUCOSE 86 192*  BUN 13 21  CREATININE 1.01* 0.95  CALCIUM 8.9 8.6*    EKG/Telemetry: Personally reviewed.  Rhythm is normal sinus rhythm.    Code Status:  Full code.  Code status decision has been confirmed with: patient   Severity of Illness:  The appropriate patient status for this patient is INPATIENT. Inpatient status is judged to be reasonable and necessary in order to provide the required intensity of service to ensure the patient's safety. The patient's presenting symptoms, physical exam findings, and initial radiographic and laboratory data in the context of their chronic comorbidities is felt to place them at high risk for further clinical deterioration. Furthermore, it is not anticipated that the patient will be medically stable for discharge from the hospital within 2 midnights of admission.   * I certify that at the point of admission it is my clinical judgment that the patient will require inpatient hospital care spanning beyond 2 midnights from the point of admission due to high intensity of service, high risk for further deterioration and high frequency of surveillance required.*  Time spent:  56 minutes  Author:  Marinda Elk MD  02/09/2023 5:39 PM

## 2023-02-10 DIAGNOSIS — Z79899 Other long term (current) drug therapy: Secondary | ICD-10-CM | POA: Diagnosis not present

## 2023-02-10 DIAGNOSIS — M797 Fibromyalgia: Secondary | ICD-10-CM | POA: Diagnosis not present

## 2023-02-10 DIAGNOSIS — F319 Bipolar disorder, unspecified: Secondary | ICD-10-CM | POA: Diagnosis not present

## 2023-02-10 DIAGNOSIS — J441 Chronic obstructive pulmonary disease with (acute) exacerbation: Secondary | ICD-10-CM | POA: Diagnosis not present

## 2023-02-10 LAB — COMPREHENSIVE METABOLIC PANEL
ALT: 9 U/L (ref 0–44)
AST: 13 U/L — ABNORMAL LOW (ref 15–41)
Albumin: 3.1 g/dL — ABNORMAL LOW (ref 3.5–5.0)
Alkaline Phosphatase: 56 U/L (ref 38–126)
Anion gap: 11 (ref 5–15)
BUN: 21 mg/dL (ref 8–23)
CO2: 25 mmol/L (ref 22–32)
Calcium: 8.7 mg/dL — ABNORMAL LOW (ref 8.9–10.3)
Chloride: 89 mmol/L — ABNORMAL LOW (ref 98–111)
Creatinine, Ser: 0.83 mg/dL (ref 0.44–1.00)
GFR, Estimated: >60 mL/min (ref >60)
Glucose, Bld: 136 mg/dL — ABNORMAL HIGH (ref 70–99)
Potassium: 4.2 mmol/L (ref 3.5–5.1)
Sodium: 125 mmol/L — ABNORMAL LOW (ref 135–145)
Total Bilirubin: 0.3 mg/dL (ref <1.2)
Total Protein: 6.5 g/dL (ref 6.5–8.1)

## 2023-02-10 LAB — CBC WITH DIFFERENTIAL/PLATELET
Abs Immature Granulocytes: 0.13 10*3/uL — ABNORMAL HIGH (ref 0.00–0.07)
Basophils Absolute: 0 10*3/uL (ref 0.0–0.1)
Basophils Relative: 0 %
Eosinophils Absolute: 0 10*3/uL (ref 0.0–0.5)
Eosinophils Relative: 0 %
HCT: 35.5 % — ABNORMAL LOW (ref 36.0–46.0)
Hemoglobin: 11.7 g/dL — ABNORMAL LOW (ref 12.0–15.0)
Immature Granulocytes: 1 %
Lymphocytes Relative: 15 %
Lymphs Abs: 1.8 10*3/uL (ref 0.7–4.0)
MCH: 27.2 pg (ref 26.0–34.0)
MCHC: 33 g/dL (ref 30.0–36.0)
MCV: 82.6 fL (ref 80.0–100.0)
Monocytes Absolute: 0.6 10*3/uL (ref 0.1–1.0)
Monocytes Relative: 5 %
Neutro Abs: 9.6 10*3/uL — ABNORMAL HIGH (ref 1.7–7.7)
Neutrophils Relative %: 79 %
Platelets: 347 10*3/uL (ref 150–400)
RBC: 4.3 MIL/uL (ref 3.87–5.11)
RDW: 15.9 % — ABNORMAL HIGH (ref 11.5–15.5)
WBC: 12.2 10*3/uL — ABNORMAL HIGH (ref 4.0–10.5)
nRBC: 0 % (ref 0.0–0.2)

## 2023-02-10 LAB — GLUCOSE, CAPILLARY
Glucose-Capillary: 136 mg/dL — ABNORMAL HIGH (ref 70–99)
Glucose-Capillary: 182 mg/dL — ABNORMAL HIGH (ref 70–99)
Glucose-Capillary: 114 mg/dL — ABNORMAL HIGH (ref 70–99)
Glucose-Capillary: 155 mg/dL — ABNORMAL HIGH (ref 70–99)

## 2023-02-10 LAB — MAGNESIUM: Magnesium: 1.6 mg/dL — ABNORMAL LOW (ref 1.7–2.4)

## 2023-02-10 LAB — SARS CORONAVIRUS 2 BY RT PCR: SARS Coronavirus 2 by RT PCR: POSITIVE — AB

## 2023-02-10 MED ORDER — HYDROCOD POLI-CHLORPHE POLI ER 10-8 MG/5ML PO SUER
5.0000 mL | Freq: Two times a day (BID) | ORAL | Status: DC | PRN
  Administered 2023-02-10 – 2023-02-11 (×2): 5 mL via ORAL
  Filled 2023-02-10 (×3): qty 5

## 2023-02-10 MED ORDER — IPRATROPIUM-ALBUTEROL 0.5-2.5 (3) MG/3ML IN SOLN
3.0000 mL | Freq: Once | RESPIRATORY_TRACT | Status: AC
  Administered 2023-02-10: 3 mL via RESPIRATORY_TRACT
  Filled 2023-02-10: qty 3

## 2023-02-10 MED ORDER — ALBUTEROL SULFATE HFA 108 (90 BASE) MCG/ACT IN AERS
2.0000 | INHALATION_SPRAY | Freq: Four times a day (QID) | RESPIRATORY_TRACT | Status: DC
  Administered 2023-02-10 – 2023-02-11 (×5): 2 via RESPIRATORY_TRACT
  Filled 2023-02-10: qty 6.7

## 2023-02-10 MED ORDER — SODIUM CHLORIDE 1 G PO TABS: 1.0000 g | ORAL_TABLET | Freq: Two times a day (BID) | ORAL | Status: DC

## 2023-02-10 MED ORDER — MAGNESIUM SULFATE 2 GM/50ML IV SOLN
2.0000 g | Freq: Once | INTRAVENOUS | Status: AC
  Administered 2023-02-10: 2 g via INTRAVENOUS
  Filled 2023-02-10: qty 50

## 2023-02-10 MED ORDER — ALBUTEROL SULFATE HFA 108 (90 BASE) MCG/ACT IN AERS
2.0000 | INHALATION_SPRAY | RESPIRATORY_TRACT | Status: DC | PRN
  Administered 2023-02-10 – 2023-02-11 (×2): 2 via RESPIRATORY_TRACT

## 2023-02-10 NOTE — Plan of Care (Signed)
  Problem: Education: Goal: Knowledge of risk factors and measures for prevention of condition will improve Outcome: Progressing   Problem: Coping: Goal: Psychosocial and spiritual needs will be supported Outcome: Progressing   Problem: Respiratory: Goal: Will maintain a patent airway Outcome: Progressing Goal: Complications related to the disease process, condition or treatment will be avoided or minimized Outcome: Progressing   Problem: Education: Goal: Ability to describe self-care measures that may prevent or decrease complications (Diabetes Survival Skills Education) will improve Outcome: Progressing Goal: Individualized Educational Video(s) Outcome: Progressing   Problem: Coping: Goal: Ability to adjust to condition or change in health will improve Outcome: Progressing   Problem: Fluid Volume: Goal: Ability to maintain a balanced intake and output will improve Outcome: Progressing   Problem: Health Behavior/Discharge Planning: Goal: Ability to identify and utilize available resources and services will improve Outcome: Progressing Goal: Ability to manage health-related needs will improve Outcome: Progressing   Problem: Metabolic: Goal: Ability to maintain appropriate glucose levels will improve Outcome: Progressing   Problem: Nutritional: Goal: Maintenance of adequate nutrition will improve Outcome: Progressing Goal: Progress toward achieving an optimal weight will improve Outcome: Progressing   Problem: Skin Integrity: Goal: Risk for impaired skin integrity will decrease Outcome: Progressing   Problem: Tissue Perfusion: Goal: Adequacy of tissue perfusion will improve Outcome: Progressing   Problem: Education: Goal: Knowledge of General Education information will improve Description: Including pain rating scale, medication(s)/side effects and non-pharmacologic comfort measures Outcome: Progressing   Problem: Health Behavior/Discharge Planning: Goal:  Ability to manage health-related needs will improve Outcome: Progressing   Problem: Clinical Measurements: Goal: Ability to maintain clinical measurements within normal limits will improve Outcome: Progressing Goal: Will remain free from infection Outcome: Progressing Goal: Diagnostic test results will improve Outcome: Progressing Goal: Respiratory complications will improve Outcome: Progressing Goal: Cardiovascular complication will be avoided Outcome: Progressing   Problem: Activity: Goal: Risk for activity intolerance will decrease Outcome: Progressing   Problem: Nutrition: Goal: Adequate nutrition will be maintained Outcome: Progressing   Problem: Coping: Goal: Level of anxiety will decrease Outcome: Progressing   Problem: Elimination: Goal: Will not experience complications related to bowel motility Outcome: Progressing Goal: Will not experience complications related to urinary retention Outcome: Progressing   Problem: Pain Management: Goal: General experience of comfort will improve Outcome: Progressing   Problem: Safety: Goal: Ability to remain free from injury will improve Outcome: Progressing   Problem: Skin Integrity: Goal: Risk for impaired skin integrity will decrease Outcome: Progressing

## 2023-02-10 NOTE — Assessment & Plan Note (Addendum)
Airborne and contact isolation initiated. Providing patient with supplemental oxygen for bouts of hypoxia  Patient has been initiated on intravenous Solumedrol followed by Prednisone As needed bronchodilator therapy for shortness of breath and wheezing  As needed antitussives for cough

## 2023-02-10 NOTE — Assessment & Plan Note (Addendum)
Review of prior records reveals patient has been suffering from hyponatremia since at least 2022.   Reports no knowledge of being told she had hyponatremia in the past Hyponatremia workup seemingly suggestive of SIADH.   Patient denies excessive polydipsia.  Outpatient note from 05/09/2022 states that patient is drinking 3000 mL of water daily but patient reports to me that today that she drinks 3 cups of coffee and a half a gallon of iced tea daily with almost no water intake. Patient very resistant to any degree of fluid restriction, have settled with her on 1800 cc of fluid restriction daily for now that is unlikely to be adequate It is likely patient's hyponatremia is being caused by longstanding Latuda use.  Will consult psychiatry for their assistance in switching the patient to another second-generation antipsychotic or to a first generation antipsychotic. Monitoring sodium levels with serial chemistries.

## 2023-02-10 NOTE — Assessment & Plan Note (Addendum)
We will counsel on cessation once clinically improved.

## 2023-02-10 NOTE — Assessment & Plan Note (Addendum)
Home pregabalin 60 mg p.o. twice daily resumed

## 2023-02-10 NOTE — Assessment & Plan Note (Addendum)
Home alprazolam 0.5 mg p.o. twice daily

## 2023-02-10 NOTE — Assessment & Plan Note (Addendum)
Continuing Depakote  Consulting psychiatry for assistance with management of Latuda as I believe it is contributing to patient's hyponatremia.

## 2023-02-10 NOTE — Assessment & Plan Note (Addendum)
Continuing bronchodilator therapy.  Trying to rely on MDI for bronchodilator administration considering patient's active COVID-19 infection. Continuing intravenous Solu-Medrol which will then be followed by daily prednisone Supplemental oxygen initiated with target oxygen saturations of between 89 to 92%.  Patient is typically on 2 L of oxygen at baseline. Etiology is likely concurrent COVID-19 infection  Unable to initiate Paxlovid due to current use of Latuda. Will consider initiation of remdesivir or barcitinib if patient clinically worsens

## 2023-02-10 NOTE — Assessment & Plan Note (Addendum)
Patient is being counseled daily on smoking cessation. As needed nicotine patch/nicotine gum ordered

## 2023-02-10 NOTE — TOC Initial Note (Signed)
Transition of Care [facility]) - Initial/Assessment Note    Patient Details  Name: Nicole Austin MRN: [id number] Date of Birth: 08-08-1960  Transition of Care [facility]) CM/SW Contact:    Liliana Cline, LCSW Phone Number: 02/10/2023, 9:53 AM  Clinical Narrative:                 CSW spoke with patient for readmission risk assessment. Patient lives alone. She uses medical transportation through [facility] or public transport. PCP is Danelle Berry. Patient had [facility] in the past, is not currently active with Home Health. Patient went to [facility] over the summer for short term rehab. Patient states she has DME at home. Patient states she has an aide who comes out 3 times per week to assist her at home - this is through [facility] through her Medicaid.  Expected Discharge Plan: Home/Self Care Barriers to Discharge: Continued Medical Work up   Patient Goals and CMS Choice Patient states their goals for this hospitalization and ongoing recovery are:: to return home CMS Medicare.gov Compare Post Acute Care list provided to:: Patient Choice offered to / list presented to : Patient      Expected Discharge Plan and Services       Living arrangements for the past 2 months: Apartment                                      Prior Living Arrangements/Services Living arrangements for the past 2 months: Apartment Lives with:: Self Patient language and need for interpreter reviewed:: Yes Do you feel safe going back to the place where you live?: Yes      Need for Family Participation in Patient Care: Yes (Comment) Care giver support system in place?: Yes (comment) Current home services: DME Criminal Activity/Legal Involvement Pertinent to Current Situation/Hospitalization: No - Comment as needed  Activities of Daily Living   ADL Screening (condition at time of admission) Independently performs ADLs?: Yes (appropriate for developmental age) Is the patient deaf or have  difficulty hearing?: No Does the patient have difficulty seeing, even when wearing glasses/contacts?: No Does the patient have difficulty concentrating, remembering, or making decisions?: No  Permission Sought/Granted Permission sought to share information with : Facility Industrial/product designer granted to share information with : Yes, Verbal Permission Granted     Permission granted to share info w AGENCY: as needed        Emotional Assessment       Orientation: : Oriented to Self, Oriented to Place, Oriented to  Time, Oriented to Situation Alcohol / Substance Use: Not Applicable Psych Involvement: No (comment)  Admission diagnosis:  COPD exacerbation (HCC) [J44.1] Acute respiratory failure with hypoxia (HCC) [J96.01] COVID-19 [U07.1] Patient Active Problem List   Diagnosis Date Noted   COPD exacerbation (HCC) 02/08/2023   COVID-19 virus infection 02/08/2023   S/P total left hip arthroplasty 10/09/2022   Hyponatremia 07/09/2022   Iron deficiency anemia 07/07/2022   History of methicillin resistant staphylococcus aureus (MRSA) 05/03/2022   Avascular necrosis of bones of both hips (HCC) 05/03/2022   Chronic prescription benzodiazepine use 05/03/2022   Polypharmacy 04/09/2022   Sinus bradycardia 04/09/2022   Depression with anxiety 04/09/2022   MRSA (methicillin resistant staph aureus) culture positive 05/04/2021   Prolonged QT interval 01/01/2021   Bipolar disorder with depression (HCC) 02/25/2017   ADD (attention deficit disorder) 02/25/2017   Marijuana use 02/13/2017  Neuropathy, peripheral 05/07/2016   Vitamin B12 deficiency 10/24/2015   Fibromyalgia 08/30/2015   OP (osteoporosis) 06/30/2015   Sleep apnea 06/30/2015   HLD (hyperlipidemia) 05/13/2015   Obesity, Class III, BMI 40-49.9 (morbid obesity) (HCC) 08/16/2014   Low back pain with sciatica 08/04/2014   Tobacco abuse 08/04/2014   Anxiety, generalized 11/24/2013   COPD (chronic obstructive pulmonary  disease) (HCC) 11/18/2013   Osteoarthritis of knee, unspecified 07/15/2013   PCP:  Danelle Berry, PA-C Pharmacy:   [facility] - [place], [place] - [address] [place] [address] Phone: [phone] Fax: [phone]  [facility] - [place], [place] - [address] [place] [address] Phone: [phone] Fax: [phone]  [facility] [address], [place] - [address] [place] [address] Phone: [phone] Fax: [phone]  [id number] - [place], [place] - [address] AT [facility] OF [address] [facility] [address] [place] [address] Phone: [phone] Fax: [phone]  [id number] - [place], [place] - [address] AT [facility] OF [address] & [address] [place] [address] Phone: [phone] Fax: [phone]     Social Drivers of Health (SDOH) Social History: SDOH Screenings   Food Insecurity: No Food Insecurity (02/09/2023)  Housing: Unknown (02/09/2023)  Transportation Needs: No Transportation Needs (02/09/2023)  Utilities: Not At Risk (02/09/2023)  Alcohol Screen: Low Risk  (03/21/2022)  Depression (PHQ2-9): Medium Risk (02/01/2023)  Financial Resource Strain: Low Risk  (04/25/2020)  Recent Concern: Financial Resource Strain - Medium Risk (03/17/2020)  Physical Activity: Inactive (03/21/2021)  Social Connections: Socially Isolated (03/21/2021)  Stress: Stress Concern Present (03/21/2021)  Tobacco Use: Medium Risk (02/08/2023)   SDOH Interventions:     Readmission Risk Interventions    02/10/2023    9:52 AM  Readmission Risk Prevention Plan  Transportation Screening Complete  Medication Review (RN Care Manager) Complete  PCP or Specialist appointment within 3-5 days of discharge Complete  HRI or Home Care Consult Complete  SW Recovery Care/Counseling Consult Complete  Palliative Care Screening Not  Applicable  Skilled Nursing Facility Complete

## 2023-02-10 NOTE — Progress Notes (Signed)
PROGRESS NOTE   Nicole Austin  QIH:[id number] DOB: [birth year] DOA: 02/08/2023 PCP: Danelle Berry, PA-C   Date of Service: the patient was seen and examined on 02/10/2023  Brief Narrative:  62 year old female with history of COPD with chronic 2 L nasal cannula requirements, anxiety, onychomycosis of both feet toenails, chronic back pain, anxiety, bipolar disorder with depression, gout, morbid obesity, history of stroke, history of dysphagia, who presents to [facility] emergency department for chief concerns of cough, wheezing and shortness of breath.  Of note, patient has recently been diagnosed with COVID-19 on 12/9.  Upon evaluation in the emergency department patient was found to have a leukocytosis of 13.1.  Chest x-ray was unremarkable.  Patient was felt to be suffering from COPD exacerbation secondary to ongoing COVID-19 infection.  The hospitalist group was then called to assess the patient for admission to the hospital.  Patient was initiated on bronchodilator therapy and intravenous systemic steroids with intravenous Solumedrol.  Patient was placed on airborne precautions.  Hospital course was complicated by worsening hyponatremia.  Hyponatremia workup was initiated with results seemingly confirmatory of SIADH with concerns that is being caused by ongoing Latuda use.  Consultation placed with psychiatry for assistance in adjusting patient's schizophrenia treatment regimen to address the hyponatremia.   Assessment & Plan COPD exacerbation (HCC) Continuing bronchodilator therapy.  Trying to rely on MDI for bronchodilator administration considering patient's active COVID-19 infection. Continuing intravenous Solu-Medrol which will then be followed by daily prednisone Supplemental oxygen initiated with target oxygen saturations of between 89 to 92%.  Patient is typically on 2 L of oxygen at baseline. Etiology is likely concurrent COVID-19 infection  Unable to initiate Paxlovid  due to current use of Latuda. Will consider initiation of remdesivir or barcitinib if patient clinically worsens  COVID-19 virus infection Airborne and contact isolation initiated. Providing patient with supplemental oxygen for bouts of hypoxia  Patient has been initiated on intravenous Solumedrol followed by Prednisone As needed bronchodilator therapy for shortness of breath and wheezing  As needed antitussives for cough   SIADH (syndrome of inappropriate ADH production) (HCC) Review of prior records reveals patient has been suffering from hyponatremia since at least 2022.   Reports no knowledge of being told she had hyponatremia in the past Hyponatremia workup seemingly suggestive of SIADH.   Patient denies excessive polydipsia.  Outpatient note from 05/09/2022 states that patient is drinking 3000 mL of water daily but patient reports to me that today that she drinks 3 cups of coffee and a half a gallon of iced tea daily with almost no water intake. Patient very resistant to any degree of fluid restriction, have settled with her on 1800 cc of fluid restriction daily for now that is unlikely to be adequate It is likely patient's hyponatremia is being caused by longstanding Latuda use.  Will consult psychiatry for their assistance in switching the patient to another second-generation antipsychotic or to a first generation antipsychotic. Monitoring sodium levels with serial chemistries. Bipolar disorder with depression [facility]) Continuing Depakote  Consulting psychiatry for assistance with management of Latuda as I believe it is contributing to patient's hyponatremia. Depression with anxiety Home alprazolam 0.5 mg p.o. twice daily  Neuropathy, peripheral Home pregabalin 60 mg p.o. twice daily resumed Tobacco abuse Patient is being counseled daily on smoking cessation. As needed nicotine patch/nicotine gum ordered Obesity, Class III, BMI 40-49.9 (morbid obesity) (HCC) We will counsel on  cessation once clinically improved.   Subjective:  Patient stayed overall her shortness of  breath is improving however patient has had bouts of severe shortness of breath this morning.  Associated malaise and weakness are improving.  Physical Exam:  Vitals:   02/09/23 1722 02/09/23 2237 02/10/23 0753 02/10/23 1645  BP: (!) 121/58 (!) 146/65 (!) 130/51 127/84  Pulse: 76 65 (!) 57 79  Resp: 18 18 16 16   Temp: 97.8 F (36.6 C) 98 F (36.7 C) 98.6 F (37 C) (!) 97.4 F (36.3 C)  TempSrc:      SpO2: 93% 95% 95% 95%  Weight:      Height:        Constitutional: Awake alert and oriented x3, no associated distress.   Skin: no rashes, no lesions, good skin turgor noted. Eyes: Pupils are equally reactive to light.  No evidence of scleral icterus or conjunctival pallor.  ENMT: Moist mucous membranes noted.  Posterior pharynx clear of any exudate or lesions.   Respiratory: Intermittent expiratory wheezing noted with prolonged respiratory phase.  Slightly improved air movement compared to yesterday.  No associated rales.  No evidence of tachypnea and no evidence of accessory muscle use.   Cardiovascular: Regular rate and rhythm, no murmurs / rubs / gallops. No extremity edema. 2+ pedal pulses. No carotid bruits.  Abdomen: Abdomen is soft and nontender.  No evidence of intra-abdominal masses.  Positive bowel sounds noted in all quadrants.   Musculoskeletal: No joint deformity upper and lower extremities. Good ROM, no contractures. Normal muscle tone.    Data Reviewed:  I have personally reviewed and interpreted labs, imaging.  Significant findings are   CBC: Recent Labs  Lab 02/08/23 1105 02/09/23 0226 02/10/23 0620  WBC 13.1* 10.3 12.2*  NEUTROABS  --   --  9.6*  HGB 12.6 11.8* 11.7*  HCT 39.2 35.6* 35.5*  MCV 85.4 81.8 82.6  PLT 341 327 347   Basic Metabolic Panel: Recent Labs  Lab 02/08/23 1105 02/09/23 0226 02/10/23 0620  NA 132* 126* 125*  K 4.1 3.8 4.2  CL 94* 89*  89*  CO2 27 25 25   GLUCOSE 86 192* 136*  BUN 13 21 21   CREATININE 1.01* 0.95 0.83  CALCIUM 8.9 8.6* 8.7*  MG  --   --  1.6*    EKG/Telemetry: Personally reviewed.  Rhythm is normal sinus rhythm.    Code Status:  Full code.  Code status decision has been confirmed with: patient   Severity of Illness:  The appropriate patient status for this patient is INPATIENT. Inpatient status is judged to be reasonable and necessary in order to provide the required intensity of service to ensure the patient's safety. The patient's presenting symptoms, physical exam findings, and initial radiographic and laboratory data in the context of their chronic comorbidities is felt to place them at high risk for further clinical deterioration. Furthermore, it is not anticipated that the patient will be medically stable for discharge from the hospital within 2 midnights of admission.   * I certify that at the point of admission it is my clinical judgment that the patient will require inpatient hospital care spanning beyond 2 midnights from the point of admission due to high intensity of service, high risk for further deterioration and high frequency of surveillance required.*  Time spent:  60 minutes  Author:  Marinda Elk MD  02/10/2023 7:27 PM

## 2023-02-10 NOTE — Plan of Care (Signed)
  Problem: Education: Goal: Knowledge of risk factors and measures for prevention of condition will improve Outcome: Progressing   Problem: Coping: Goal: Psychosocial and spiritual needs will be supported Outcome: Progressing   Problem: Respiratory: Goal: Will maintain a patent airway Outcome: Progressing Goal: Complications related to the disease process, condition or treatment will be avoided or minimized Outcome: Progressing   Problem: Education: Goal: Ability to describe self-care measures that may prevent or decrease complications (Diabetes Survival Skills Education) will improve Outcome: Progressing Goal: Individualized Educational Video(s) Outcome: Progressing   Problem: Coping: Goal: Ability to adjust to condition or change in health will improve Outcome: Progressing   Problem: Fluid Volume: Goal: Ability to maintain a balanced intake and output will improve Outcome: Progressing   Problem: Health Behavior/Discharge Planning: Goal: Ability to identify and utilize available resources and services will improve Outcome: Progressing Goal: Ability to manage health-related needs will improve Outcome: Progressing   Problem: Metabolic: Goal: Ability to maintain appropriate glucose levels will improve Outcome: Progressing   Problem: Nutritional: Goal: Maintenance of adequate nutrition will improve Outcome: Progressing Goal: Progress toward achieving an optimal weight will improve Outcome: Progressing   Problem: Skin Integrity: Goal: Risk for impaired skin integrity will decrease Outcome: Progressing   Problem: Tissue Perfusion: Goal: Adequacy of tissue perfusion will improve Outcome: Progressing   

## 2023-02-11 DIAGNOSIS — J441 Chronic obstructive pulmonary disease with (acute) exacerbation: Secondary | ICD-10-CM | POA: Diagnosis not present

## 2023-02-11 LAB — GLUCOSE, CAPILLARY
Glucose-Capillary: 117 mg/dL — ABNORMAL HIGH (ref 70–99)
Glucose-Capillary: 132 mg/dL — ABNORMAL HIGH (ref 70–99)

## 2023-02-11 LAB — COMPREHENSIVE METABOLIC PANEL
ALT: 10 U/L (ref 0–44)
AST: 12 U/L — ABNORMAL LOW (ref 15–41)
Albumin: 3.4 g/dL — ABNORMAL LOW (ref 3.5–5.0)
Alkaline Phosphatase: 58 U/L (ref 38–126)
Anion gap: 6 (ref 5–15)
BUN: 29 mg/dL — ABNORMAL HIGH (ref 8–23)
CO2: 29 mmol/L (ref 22–32)
Calcium: 8.7 mg/dL — ABNORMAL LOW (ref 8.9–10.3)
Chloride: 88 mmol/L — ABNORMAL LOW (ref 98–111)
Creatinine, Ser: 0.9 mg/dL (ref 0.44–1.00)
GFR, Estimated: >60 mL/min (ref >60)
Glucose, Bld: 137 mg/dL — ABNORMAL HIGH (ref 70–99)
Potassium: 4.6 mmol/L (ref 3.5–5.1)
Sodium: 123 mmol/L — ABNORMAL LOW (ref 135–145)
Total Bilirubin: 0.5 mg/dL (ref <1.2)
Total Protein: 7 g/dL (ref 6.5–8.1)

## 2023-02-11 LAB — CBC WITH DIFFERENTIAL/PLATELET
Abs Immature Granulocytes: 0.13 10*3/uL — ABNORMAL HIGH (ref 0.00–0.07)
Basophils Absolute: 0 10*3/uL (ref 0.0–0.1)
Basophils Relative: 0 %
Eosinophils Absolute: 0 10*3/uL (ref 0.0–0.5)
Eosinophils Relative: 0 %
HCT: 35.2 % — ABNORMAL LOW (ref 36.0–46.0)
Hemoglobin: 11.8 g/dL — ABNORMAL LOW (ref 12.0–15.0)
Immature Granulocytes: 1 %
Lymphocytes Relative: 11 %
Lymphs Abs: 1.5 10*3/uL (ref 0.7–4.0)
MCH: 26.9 pg (ref 26.0–34.0)
MCHC: 33.5 g/dL (ref 30.0–36.0)
MCV: 80.4 fL (ref 80.0–100.0)
Monocytes Absolute: 0.8 10*3/uL (ref 0.1–1.0)
Monocytes Relative: 6 %
Neutro Abs: 11.8 10*3/uL — ABNORMAL HIGH (ref 1.7–7.7)
Neutrophils Relative %: 82 %
Platelets: 378 10*3/uL (ref 150–400)
RBC: 4.38 MIL/uL (ref 3.87–5.11)
RDW: 15.8 % — ABNORMAL HIGH (ref 11.5–15.5)
WBC: 14.3 10*3/uL — ABNORMAL HIGH (ref 4.0–10.5)
nRBC: 0 % (ref 0.0–0.2)

## 2023-02-11 LAB — MAGNESIUM: Magnesium: 2.2 mg/dL (ref 1.7–2.4)

## 2023-02-11 MED ORDER — MOMETASONE FURO-FORMOTEROL FUM 100-5 MCG/ACT IN AERO: 2.0000 | INHALATION_SPRAY | Freq: Two times a day (BID) | RESPIRATORY_TRACT | 0 refills | Status: AC

## 2023-02-11 MED ORDER — INCRUSE ELLIPTA 62.5 MCG/ACT IN AEPB: 1.0000 | INHALATION_SPRAY | Freq: Every day | RESPIRATORY_TRACT | 0 refills | Status: DC

## 2023-02-11 MED ORDER — SODIUM CHLORIDE 1 G PO TABS: 1.0000 g | ORAL_TABLET | Freq: Two times a day (BID) | ORAL | 0 refills | Status: AC

## 2023-02-11 MED ORDER — SODIUM CHLORIDE 1 G PO TABS
1.0000 g | ORAL_TABLET | Freq: Three times a day (TID) | ORAL | Status: DC
  Administered 2023-02-11 (×2): 1 g via ORAL
  Filled 2023-02-11 (×2): qty 1

## 2023-02-11 MED ORDER — PREDNISONE 10 MG PO TABS: ORAL_TABLET | ORAL | 0 refills | Status: AC

## 2023-02-11 NOTE — Plan of Care (Signed)
  Problem: Education: Goal: Knowledge of risk factors and measures for prevention of condition will improve Outcome: Progressing   Problem: Coping: Goal: Psychosocial and spiritual needs will be supported Outcome: Progressing   Problem: Respiratory: Goal: Will maintain a patent airway Outcome: Progressing Goal: Complications related to the disease process, condition or treatment will be avoided or minimized Outcome: Progressing   Problem: Coping: Goal: Ability to adjust to condition or change in health will improve Outcome: Progressing   Problem: Skin Integrity: Goal: Risk for impaired skin integrity will decrease Outcome: Progressing   Problem: Education: Goal: Knowledge of General Education information will improve Description: Including pain rating scale, medication(s)/side effects and non-pharmacologic comfort measures Outcome: Progressing   Problem: Activity: Goal: Risk for activity intolerance will decrease Outcome: Progressing   Problem: Nutrition: Goal: Adequate nutrition will be maintained Outcome: Progressing   Problem: Coping: Goal: Level of anxiety will decrease Outcome: Progressing   Problem: Pain Management: Goal: General experience of comfort will improve Outcome: Progressing

## 2023-02-11 NOTE — Care Management Important Message (Signed)
Important Message  Patient Details  Name: Nicole Austin MRN: [id number] Date of Birth: April 11, 1960   Important Message Given:  N/A - LOS <3 / Initial given by admissions     Olegario Messier A Britlee Skolnik 02/11/2023, 11:23 AM

## 2023-02-11 NOTE — Plan of Care (Signed)
Patient discharged per MD orders at this time.All dc instructions, education and medications reviewed with the patient.Pt expressed understanding and will comply with dc instructions. Follow up appointments was also communicated to the patient.no verbal c/o or any ssx of distress at this time.Pt was dc home with self-care per order.Pt was transported home by brother in a privately owned vehicle.

## 2023-02-11 NOTE — Discharge Summary (Signed)
Physician Discharge Summary  Nicole Austin ZOX:[id number] DOB: 17-Aug-1960 DOA: 02/08/2023  PCP: Danelle Berry, PA-C  Admit date: 02/08/2023 Discharge date: 02/11/2023  Admitted From: Home Disposition: Home  Recommendations for Outpatient Follow-up:  Follow up with PCP in 1-2 weeks Prednisone taper for COPD exacerbation Sodium chloride 1 g p.o. twice daily for hyponatremia likely secondary to SIADH (non-compliant with fluid restriction) Please obtain BMP in one week reassess sodium level Continue to encourage tobacco cessation  Home Health: No Equipment/Devices: None  Discharge Condition: Stable CODE STATUS: Full code Diet recommendation: Heart healthy diet  History of present illness:  Nicole Austin is a 62 year old female with past medical history significant for chronic respiratory failure/COPD on 2 L nasal cannula at baseline, anxiety/depression, bipolar disorder, history of CVA, history of dysphagia, chronic back pain who presented to [facility] ED on 12/13 with cough, wheezing and progressive shortness of breath.  Recently diagnosed with Covid-19 viral infection on 12/9.  In the ED, temperature 97.9 F, HR 75, RR 20, BP 129/91, SpO2 91% on 3 L nasal cannula.  WBC 13.1, hemoglobin 12.6, platelet count 341.  Sodium 132, potassium 4.1, chloride 94, CO2 27, glucose 86, BUN 13, creatinine 1.01.  Procalcitonin less than 0.10.  Covid-19 PCR positive.  Chest x-ray with no active cardiopulmonary disease process.  TRH was consulted for admission for further evaluation and management of COPD exacerbation  Hospital course:  COPD exacerbation Chronic hypoxic respiratory failure on 3 L nasal cannula at baseline Patient presenting to ED with progressive shortness of breath, wheezing.  Patient was afebrile without leukocytosis.  Chest x-ray with no focal consolidation.  Patient was admitted for COPD exacerbation and started on scheduled neb treatments, IV steroids.  Patient's symptoms  slowly improved during hospitalization.  Patient will discharge on prednisone taper.  Started on [place].  Outpatient follow-up with PCP.  Hyponatremia likely secondary to SIADH Chronic issue per record review, suffering from hyponatremia since 2022.  Outpatient note from 05/09/2022 notable for patient drinking 3 L of water daily, also endorses excessive use of iced tea and drinking 3 cups of coffee with almost no water intake.  Patient resistant to fluid restriction while inpatient.  Started on sodium chloride 1 g p.o. twice daily.  Recommend repeat BMP 1 week.  Covid-19 viral infection Continue Depakote, Latuda.  Outpatient follow-up with behavioral health.  Bipolar disorder Anxiety/depression Continue home alprazolam 0.5 mg p.o. twice daily  Peripheral neuropathy Continue home pregabalin 60 mg p.o. twice daily  Tobacco use disorder Counseled on need for complete cessation/abstinence.  Obesity, class III Body mass index is 42.51 kg/m.  Complicates all facets of care   Discharge Diagnoses:  Principal Problem:   COPD exacerbation (HCC) Active Problems:   Polypharmacy   Fibromyalgia   HLD (hyperlipidemia)   Bipolar disorder with depression (HCC)   Depression with anxiety   OP (osteoporosis)   Neuropathy, peripheral   Sleep apnea   Tobacco abuse   Obesity, Class III, BMI 40-49.9 (morbid obesity) (HCC)   SIADH (syndrome of inappropriate ADH production) (HCC)   S/P total left hip arthroplasty   COVID-19 virus infection    Discharge Instructions  Discharge Instructions     Call MD for:  difficulty breathing, headache or visual disturbances   Complete by: As directed    Call MD for:  extreme fatigue   Complete by: As directed    Call MD for:  persistant dizziness or light-headedness   Complete by: As directed  Call MD for:  persistant nausea and vomiting   Complete by: As directed    Call MD for:  severe uncontrolled pain   Complete by: As  directed    Call MD for:  temperature >100.4   Complete by: As directed    Diet - low sodium heart healthy   Complete by: As directed    Increase activity slowly   Complete by: As directed       Allergies as of 02/11/2023       Reactions   Chantix [varenicline] Nausea Only   Glycopyrrolate Rash   Oral irritation        Medication List     STOP taking these medications    amoxicillin-clavulanate 875-125 MG tablet Commonly known as: AUGMENTIN       TAKE these medications    ALPRAZolam 0.5 MG tablet Commonly known as: XANAX Take 1 tablet (0.5 mg total) by mouth 2 (two) times daily.   chlorhexidine 4 % external liquid Commonly known as: HIBICLENS Apply 15 mLs (1 Application total) topically as directed for 30 doses. Use as directed daily for 5 days every other week for 6 weeks.   divalproex 250 MG DR tablet Commonly known as: DEPAKOTE Take 1 tablet (250 mg total) by mouth 2 (two) times daily.   escitalopram 20 MG tablet Commonly known as: LEXAPRO Take 20 mg by mouth daily.   furosemide 20 MG tablet Commonly known as: LASIX Take 0.5-1 tablets (10-20 mg total) by mouth daily as needed for fluid or edema. Take in am with potassium PRN   Lurasidone HCl 60 MG Tabs Commonly known as: Latuda Take 1 tablet (60 mg total) by mouth daily with breakfast.   multivitamin with minerals tablet Take 1 tablet by mouth daily.   naloxone 4 MG/0.1ML Liqd nasal spray kit Commonly known as: NARCAN Spray into nostril with signs of opioid related oversedation or overdose   nystatin powder Commonly known as: MYCOSTATIN/NYSTOP Apply 1 Application topically 3 (three) times daily. What changed:  when to take this reasons to take this   oxyCODONE 5 MG immediate release tablet Commonly known as: Oxy IR/ROXICODONE Take 1 tablet (5 mg total) by mouth every 4 (four) hours as needed for severe pain.   oxyCODONE-acetaminophen 5-325 MG tablet Commonly known as: Percocet Take 1  tablet by mouth every 8 (eight) hours as needed for severe pain (pain score 7-10).   polyethylene glycol 17 g packet Commonly known as: MIRALAX / GLYCOLAX Take 17 g by mouth 2 (two) times daily.   predniSONE 10 MG tablet Commonly known as: DELTASONE Take 4 tablets (40 mg total) by mouth daily for 2 days, THEN 3 tablets (30 mg total) daily for 2 days, THEN 2 tablets (20 mg total) daily for 2 days, THEN 1 tablet (10 mg total) daily for 2 days. Start taking on: February 12, 2023 What changed:  medication strength See the new instructions.   pregabalin 25 MG capsule Commonly known as: LYRICA Take 2 capsules (50 mg total) by mouth 2 (two) times daily.   rOPINIRole 4 MG 24 hr tablet Commonly known as: REQUIP XL Take 4 mg by mouth at bedtime.   senna-docusate 8.6-50 MG tablet Commonly known as: Senokot-S Take 1 tablet by mouth 2 (two) times daily.   sodium chloride 1 g tablet Take 1 tablet (1 g total) by mouth 2 (two) times daily with a meal.        Follow-up Information     Danelle Berry,  PA-C. Schedule an appointment as soon as possible for a visit in 1 week(s).   Specialty: Family Medicine Contact information: [address] [place] [address] [phone]                Allergies  Allergen Reactions   Chantix [Varenicline] Nausea Only   Glycopyrrolate Rash    Oral irritation    Consultations: None   Procedures/Studies: DG Chest 2 View Result Date: 02/08/2023 CLINICAL DATA:  Wheezing.  Congestion. EXAM: CHEST - 2 VIEW COMPARISON:  07/04/2022. FINDINGS: Bilateral lung fields are clear. Bilateral costophrenic angles are clear. Normal cardio-mediastinal silhouette. No acute osseous abnormalities. The soft tissues are within normal limits. IMPRESSION: No active cardiopulmonary disease. Electronically Signed   By: Jules Schick M.D.   On: 02/08/2023 12:23   DG Knee Complete 4 Views Left Result Date: 02/06/2023 CLINICAL DATA:  Left knee pain  after fall last night. EXAM: LEFT KNEE - COMPLETE 4+ VIEW COMPARISON:  Left knee x-rays dated Jul 06, 2022. FINDINGS: Prior total knee arthroplasty. No evidence of hardware failure or loosening. No acute fracture or dislocation. Old healed fracture deformity of the proximal fibula. Unchanged small joint effusion. Soft tissues are unremarkable. IMPRESSION: 1. No acute osseous abnormality. Prior total knee arthroplasty without hardware complication. 2. Unchanged small joint effusion. Electronically Signed   By: Obie Dredge M.D.   On: 02/06/2023 11:48   DG Hip Unilat  With Pelvis 2-3 Views Right Result Date: 01/26/2023 CLINICAL DATA:  Right hip pain. The patient felt something pop before experiencing severe pain. EXAM: DG HIP (WITH OR WITHOUT PELVIS) 2-3V RIGHT COMPARISON:  CT renal stone protocol 6/10/4. FINDINGS: The right hip is located.  No acute fracture is present. Left total hip arthroplasty is noted. Degenerative changes are present in the lower lumbar spine. IMPRESSION: 1. No acute abnormality of the right hip. 2. Left total hip arthroplasty. 3. Degenerative changes in the lower lumbar spine. Electronically Signed   By: Marin Roberts M.D.   On: 01/26/2023 14:21     Subjective: Patient seen examined bedside, resting calmly.  Lying in bed.  Breathing is back to her typical baseline; and oxygen remains at home dose of 3 L per nasal cannula.  Patient desires discharge home today.  No other specific questions, concerns or complaints at this time.  Denies headache, no dizziness, no chest pain, no palpitations, no shortness of breath more than typical baseline, no fever/chills/night sweats, no nausea/vomiting/diarrhea, no focal weakness, no fatigue, no paresthesias.  No acute events overnight per nursing staff.  Discharge Exam: Vitals:   02/11/23 0411 02/11/23 1012  BP: 139/70 (!) 126/56  Pulse: 63 70  Resp: 16 17  Temp: 98.3 F (36.8 C) 98 F (36.7 C)  SpO2: 96% 95%   Vitals:    02/10/23 1645 02/10/23 2045 02/11/23 0411 02/11/23 1012  BP: 127/84 (!) 127/48 139/70 (!) 126/56  Pulse: 79 71 63 70  Resp: 16 18 16 17   Temp: (!) 97.4 F (36.3 C) 98 F (36.7 C) 98.3 F (36.8 C) 98 F (36.7 C)  TempSrc:      SpO2: 95% 97% 96% 95%  Weight:      Height:        Physical Exam: GEN: NAD, alert and oriented x 3, obese, elderly/chronically ill in appearance HEENT: NCAT, PERRL, EOMI, sclera clear, MMM PULM: Late expiratory wheezes bilateral upper lung fields, otherwise no crackles, normal respiratory effort without accessory muscle use, on 3 L nasal cannula which  is her baseline CV: RRR w/o M/G/R GI: abd soft, NTND, NABS, no R/G/M MSK: Trace bilateral lower extremity peripheral edema, muscle strength globally intact 5/5 bilateral upper/lower extremities NEURO: CN II-XII intact, no focal deficits, sensation to light touch intact PSYCH: normal mood/affect Integumentary: dry/intact, no rashes or wounds    The results of significant diagnostics from this hospitalization (including imaging, microbiology, ancillary and laboratory) are listed below for reference.     Microbiology: Recent Results (from the past 240 hours)  Novel Coronavirus, NAA ([facility])     Status: Abnormal   Collection Time: 02/04/23 12:00 AM   Specimen: Nasopharyngeal(NP) swabs in vial transport medium   Nasopharynge  Previous  Result Value Ref Range Status   SARS-CoV-2, NAA Detected (A) Not Detected Final    Comment: Patients who have a positive COVID-19 test result may now have treatment options. Treatment options are available for patients with mild to moderate symptoms and for hospitalized patients. Visit our website at [email or web address] for resources and information. This nucleic acid amplification test was developed and its performance characteristics determined by [employer]. Nucleic acid amplification tests include RT-PCR and TMA. This test has not been FDA cleared  or approved. This test has been authorized by FDA under an Emergency Use Authorization (EUA). This test is only authorized for the duration of time the declaration that circumstances exist justifying the authorization of the emergency use of in vitro diagnostic tests for detection of SARS-CoV-2 virus and/or diagnosis of COVID-19 infection under section 564(b)(1) of the Act, 21 U.S.C. 213YQM-5(H) (1), unless the authorization is terminated or revoked sooner. When diagnostic testing is negativ e, the possibility of a false negative result should be considered in the context of a patient's recent exposures and the presence of clinical signs and symptoms consistent with COVID-19. An individual without symptoms of COVID-19 and who is not shedding SARS-CoV-2 virus would expect to have a negative (not detected) result in this assay.   SARS Coronavirus 2 by RT PCR (hospital order, performed in [facility] hospital lab) *cepheid single result test* Anterior Nasal Swab     Status: Abnormal   Collection Time: 02/10/23  8:24 PM   Specimen: Anterior Nasal Swab  Result Value Ref Range Status   SARS Coronavirus 2 by RT PCR POSITIVE (A) NEGATIVE Final    Comment: (NOTE) SARS-CoV-2 target nucleic acids are DETECTED  SARS-CoV-2 RNA is generally detectable in upper respiratory specimens  during the acute phase of infection.  Positive results are indicative  of the presence of the identified virus, but do not rule out bacterial infection or co-infection with other pathogens not detected by the test.  Clinical correlation with patient history and  other diagnostic information is necessary to determine patient infection status.  The expected result is negative.  Fact Sheet for Patients:   RoadLapTop.co.za   Fact Sheet for Healthcare Providers:   http://kim-miller.com/    This test is not yet approved or cleared by the [place] FDA and  has been  authorized for detection and/or diagnosis of SARS-CoV-2 by FDA under an Emergency Use Authorization (EUA).  This EUA will remain in effect (meaning this test can be used) for the duration of  the COVID-19 declaration under Section 564(b)(1)  of the Act, 21 U.S.C. section 360-bbb-3(b)(1), unless the authorization is terminated or revoked sooner.   Performed at [facility], [address] [place], [place] [address]      Labs: BNP (last 3 results) Recent Labs    08/06/22  1752  BNP 81.8   Basic Metabolic Panel: Recent Labs  Lab 02/08/23 1105 02/09/23 0226 02/10/23 0620 02/11/23 0247  NA 132* 126* 125* 123*  K 4.1 3.8 4.2 4.6  CL 94* 89* 89* 88*  CO2 27 25 25 29   GLUCOSE 86 192* 136* 137*  BUN 13 21 21  29*  CREATININE 1.01* 0.95 0.83 0.90  CALCIUM 8.9 8.6* 8.7* 8.7*  MG  --   --  1.6* 2.2   Liver Function Tests: Recent Labs  Lab 02/10/23 0620 02/11/23 0247  AST 13* 12*  ALT 9 10  ALKPHOS 56 58  BILITOT 0.3 0.5  PROT 6.5 7.0  ALBUMIN 3.1* 3.4*   No results for input(s): "LIPASE", "AMYLASE" in the last 168 hours. No results for input(s): "AMMONIA" in the last 168 hours. CBC: Recent Labs  Lab 02/08/23 1105 02/09/23 0226 02/10/23 0620 02/11/23 0247  WBC 13.1* 10.3 12.2* 14.3*  NEUTROABS  --   --  9.6* 11.8*  HGB 12.6 11.8* 11.7* 11.8*  HCT 39.2 35.6* 35.5* 35.2*  MCV 85.4 81.8 82.6 80.4  PLT 341 327 347 378   Cardiac Enzymes: No results for input(s): "CKTOTAL", "CKMB", "CKMBINDEX", "TROPONINI" in the last 168 hours. BNP: Invalid input(s): "POCBNP" CBG: Recent Labs  Lab 02/10/23 0756 02/10/23 1208 02/10/23 1706 02/10/23 2154 02/11/23 0842  GLUCAP 136* 182* 114* 155* 117*   D-Dimer No results for input(s): "DDIMER" in the last 72 hours. Hgb A1c Recent Labs    02/09/23 0226  HGBA1C 5.9*   Lipid Profile No results for input(s): "CHOL", "HDL", "LDLCALC", "TRIG", "CHOLHDL", "LDLDIRECT" in the last 72 hours. Thyroid function  studies No results for input(s): "TSH", "T4TOTAL", "T3FREE", "THYROIDAB" in the last 72 hours.  Invalid input(s): "FREET3" Anemia work up No results for input(s): "VITAMINB12", "FOLATE", "FERRITIN", "TIBC", "IRON", "RETICCTPCT" in the last 72 hours. Urinalysis    Component Value Date/Time   COLORURINE YELLOW (A) 08/06/2022 2029   APPEARANCEUR HAZY (A) 08/06/2022 2029   APPEARANCEUR Clear 06/15/2014 0241   LABSPEC 1.014 08/06/2022 2029   LABSPEC 1.024 06/15/2014 0241   PHURINE 7.0 08/06/2022 2029   GLUCOSEU NEGATIVE 08/06/2022 2029   GLUCOSEU Negative 06/15/2014 0241   HGBUR NEGATIVE 08/06/2022 2029   BILIRUBINUR NEGATIVE 08/06/2022 2029   BILIRUBINUR Negative 06/15/2014 0241   KETONESUR NEGATIVE 08/06/2022 2029   PROTEINUR NEGATIVE 08/06/2022 2029   NITRITE POSITIVE (A) 08/06/2022 2029   LEUKOCYTESUR SMALL (A) 08/06/2022 2029   LEUKOCYTESUR Negative 06/15/2014 0241   Sepsis Labs Recent Labs  Lab 02/08/23 1105 02/09/23 0226 02/10/23 0620 02/11/23 0247  WBC 13.1* 10.3 12.2* 14.3*   Microbiology Recent Results (from the past 240 hours)  Novel Coronavirus, NAA ([facility])     Status: Abnormal   Collection Time: 02/04/23 12:00 AM   Specimen: Nasopharyngeal(NP) swabs in vial transport medium   Nasopharynge  Previous  Result Value Ref Range Status   SARS-CoV-2, NAA Detected (A) Not Detected Final    Comment: Patients who have a positive COVID-19 test result may now have treatment options. Treatment options are available for patients with mild to moderate symptoms and for hospitalized patients. Visit our website at [email or web address] for resources and information. This nucleic acid amplification test was developed and its performance characteristics determined by [employer]. Nucleic acid amplification tests include RT-PCR and TMA. This test has not been FDA cleared or approved. This test has been authorized by FDA under an Emergency Use Authorization  (EUA). This test is only authorized for the duration of  time the declaration that circumstances exist justifying the authorization of the emergency use of in vitro diagnostic tests for detection of SARS-CoV-2 virus and/or diagnosis of COVID-19 infection under section 564(b)(1) of the Act, 21 U.S.C. 409WJX-9(J) (1), unless the authorization is terminated or revoked sooner. When diagnostic testing is negativ e, the possibility of a false negative result should be considered in the context of a patient's recent exposures and the presence of clinical signs and symptoms consistent with COVID-19. An individual without symptoms of COVID-19 and who is not shedding SARS-CoV-2 virus would expect to have a negative (not detected) result in this assay.   SARS Coronavirus 2 by RT PCR (hospital order, performed in [facility] hospital lab) *cepheid single result test* Anterior Nasal Swab     Status: Abnormal   Collection Time: 02/10/23  8:24 PM   Specimen: Anterior Nasal Swab  Result Value Ref Range Status   SARS Coronavirus 2 by RT PCR POSITIVE (A) NEGATIVE Final    Comment: (NOTE) SARS-CoV-2 target nucleic acids are DETECTED  SARS-CoV-2 RNA is generally detectable in upper respiratory specimens  during the acute phase of infection.  Positive results are indicative  of the presence of the identified virus, but do not rule out bacterial infection or co-infection with other pathogens not detected by the test.  Clinical correlation with patient history and  other diagnostic information is necessary to determine patient infection status.  The expected result is negative.  Fact Sheet for Patients:   RoadLapTop.co.za   Fact Sheet for Healthcare Providers:   http://kim-miller.com/    This test is not yet approved or cleared by the [place] FDA and  has been authorized for detection and/or diagnosis of SARS-CoV-2 by FDA under an Emergency Use  Authorization (EUA).  This EUA will remain in effect (meaning this test can be used) for the duration of  the COVID-19 declaration under Section 564(b)(1)  of the Act, 21 U.S.C. section 360-bbb-3(b)(1), unless the authorization is terminated or revoked sooner.   Performed at [facility], [address] [place], [place] [address]      Time coordinating discharge: Over 30 minutes  SIGNED:   Alvira Philips [place], DO  [facility] 02/11/2023, 10:58 AM

## 2023-02-12 ENCOUNTER — Telehealth: Payer: Self-pay

## 2023-02-12 NOTE — Transitions of Care (Post Inpatient/ED Visit) (Signed)
02/12/2023  Name: Nicole Austin MRN: [id number] DOB: 09-10-1960  Today's TOC FU Call Status: Today's TOC FU Call Status:: Successful TOC FU Call Completed TOC FU Call Complete Date: 02/12/23 Patient's Name and Date of Birth confirmed.  Transition Care Management Follow-up Telephone Call Date of Discharge: 02/11/23 Discharge Facility: [facility]) Type of Discharge: Inpatient Admission Primary Inpatient Discharge Diagnosis:: COPD exacerbation How have you been since you were released from the hospital?: Better Any questions or concerns?: No  Items Reviewed: Did you receive and understand the discharge instructions provided?: No Medications obtained,verified, and reconciled?: Yes (Medications Reviewed) Any new allergies since your discharge?: No Dietary orders reviewed?: Yes Type of Diet Ordered:: Reg Heart healthy Do you have support at home?: No (Aide 3 x week - Jan for 1 hr)  Medications Reviewed Today: Medications Reviewed Today     Reviewed by Johnnette Barrios, RN (Registered Nurse) on 02/12/23 at 1248  Med List Status: <None>   Medication Order Taking? Sig Documenting Provider Last Dose Status Informant  ALPRAZolam (XANAX) 0.5 MG tablet [id number] Yes Take 1 tablet (0.5 mg total) by mouth 2 (two) times daily. Alford Highland, MD Taking Active Self  chlorhexidine (HIBICLENS) 4 % external liquid [id number] No Apply 15 mLs (1 Application total) topically as directed for 30 doses. Use as directed daily for 5 days every other week for 6 weeks.  Patient not taking: Reported on 02/12/2023   Cassandria Anger, PA-C Not Taking Active   divalproex (DEPAKOTE) 250 MG DR tablet [id number] Yes Take 1 tablet (250 mg total) by mouth 2 (two) times daily. Brandy Hale, MD Taking Active Self  escitalopram (LEXAPRO) 20 MG tablet [id number] Yes Take 20 mg by mouth daily. [provider] Taking Active Self  furosemide (LASIX) 20 MG tablet [id number] Yes Take  0.5-1 tablets (10-20 mg total) by mouth daily as needed for fluid or edema. Take in am with potassium PRN Danelle Berry, PA-C Taking Active Self  Lurasidone HCl (LATUDA) 60 MG TABS [id number] Yes Take 1 tablet (60 mg total) by mouth daily with breakfast. Alford Highland, MD Taking Active Self  mometasone-formoterol [facility]) 100-5 MCG/ACT Sandrea Matte [id number] Yes Inhale 2 puffs into the lungs 2 (two) times daily. [place], Alvira Philips, DO Taking Active   Multiple Vitamins-Minerals (MULTIVITAMIN WITH MINERALS) tablet [id number] Yes Take 1 tablet by mouth daily. [provider] Taking Active Self  naloxone [facility]) nasal spray 4 mg/0.1 mL [id number] Yes Spray into nostril with signs of opioid related oversedation or overdose Cassandria Anger, PA-C Taking Active   nystatin (MYCOSTATIN/NYSTOP) powder [id number] Yes Apply 1 Application topically 3 (three) times daily.  Patient taking differently: Apply 1 Application topically 3 (three) times daily as needed (irritation).   Margarita Mail, DO Taking Active Self  oxyCODONE (OXY IR/ROXICODONE) 5 MG immediate release tablet [id number] No Take 1 tablet (5 mg total) by mouth every 4 (four) hours as needed for severe pain.  Patient not taking: Reported on 02/12/2023   Cassandria Anger, PA-C Not Taking Active   oxyCODONE-acetaminophen (PERCOCET) 5-325 MG tablet [id number] No Take 1 tablet by mouth every 8 (eight) hours as needed for severe pain (pain score 7-10).  Patient not taking: Reported on 02/12/2023   Felecia Shelling, DPM Not Taking Active   polyethylene glycol (MIRALAX / GLYCOLAX) 17 g packet [id number] No Take 17 g by mouth 2 (two) times daily.  Patient not taking: Reported on 02/12/2023   Cassandria Anger, PA-C Not Taking Active  predniSONE (DELTASONE) 10 MG tablet [id number] Yes Take 4 tablets (40 mg total) by mouth daily for 2 days, THEN 3 tablets (30 mg total) daily for 2 days, THEN 2 tablets (20 mg total) daily for 2 days, THEN 1 tablet (10 mg total)  daily for 2 days. [place], Alvira Philips, DO Taking Active   pregabalin (LYRICA) 25 MG capsule [id number] Yes Take 2 capsules (50 mg total) by mouth 2 (two) times daily. Mecum, Erin E, PA-C Taking Active   rOPINIRole (REQUIP XL) 4 MG 24 hr tablet [id number] Yes Take 4 mg by mouth at bedtime. [provider] Taking Active Self  senna-docusate (SENOKOT-S) 8.6-50 MG tablet [id number] No Take 1 tablet by mouth 2 (two) times daily.  Patient not taking: Reported on 02/12/2023   Marrion Coy, MD Not Taking Active Self  sodium chloride 1 g tablet [id number] Yes Take 1 tablet (1 g total) by mouth 2 (two) times daily with a meal. [place], Alvira Philips, DO Taking Active   umeclidinium bromide (INCRUSE ELLIPTA) 62.5 MCG/ACT AEPB [id number] Yes Inhale 1 puff into the lungs daily. [place], Eric J, DO Taking Active             Home Care and Equipment/Supplies: Were Home Health Services Ordered?: No Any new equipment or medical supplies ordered?: No  Functional Questionnaire: Do you need assistance with bathing/showering or dressing?: Yes (Jan pvt aide assists) Do you need assistance with meal preparation?: No Do you need assistance with eating?: No Do you have difficulty maintaining continence: Yes (wears pad / liner occ stress inconitnence) Do you need assistance with getting out of bed/getting out of a chair/moving?: No Do you have difficulty managing or taking your medications?: No (Medication reconciliation/ review completed; confirmed patient is taking all newly prescribed medications as instructed; Aware of changes to previous medications and dosage adjustments. Patient denies questions/ concerns around medications today.)  Follow up appointments reviewed: PCP Follow-up appointment confirmed?: Yes Date of PCP follow-up appointment?: 02/15/23 Follow-up Provider: Danelle Berry PA [facility]ist [facility] Follow-up appointment confirmed?: No Do you need transportation to your follow-up appointment?: No (uses  [employer] lift or Therapist, nutritional) Do you understand care options if your condition(s) worsen?: Yes-patient verbalized understanding  SDOH Interventions Today    Flowsheet Row Most Recent Value  SDOH Interventions   Food Insecurity Interventions Intervention Not Indicated, Community Resources Provided  [employer] stamps]  Housing Interventions Intervention Not Indicated  Transportation Interventions Intervention Not Indicated  Utilities Interventions Intervention Not Indicated       Goals Addressed             This Visit's Progress    TOC Care Plan       Current Barriers:  Chronic Disease Management support and education needs related to COPD   RNCM Clinical Goal(s):  Patient will verbalize understanding of plan for management of COPD as evidenced by better control of symptoms  take all medications exactly as prescribed and will call provider for medication related questions as evidenced by no missed medications no unmanaged side effects  attend all scheduled medical appointments: with PCP, Cardiology and Pulmonology and [facility] Practitioners  as evidenced by no missed appointments   through collaboration with RN Care manager, provider, and care team.   Interventions: Evaluation of current treatment plan related to  self management and patient's adherence to plan as established by provider  Transitions of Care:  New goal. Doctor Visits  - discussed the importance of doctor visits  Heart Failure Interventions:  (  Status:  New goal.) Short Term Goal Basic overview and discussion of pathophysiology of Heart Failure reviewed Provided education on low sodium diet Assessed need for readable accurate scales in home Provided education about placing scale on hard, flat surface Advised patient to weigh each morning after emptying bladder Reviewed role of diuretics in prevention of fluid overload and management of heart failure; Discussed the importance of keeping all  appointments with provider  Patient Goals/Self-Care Activities: Participate in Transition of Care Program/Attend [facility] scheduled calls Take all medications as prescribed Attend all scheduled provider appointments Call pharmacy for medication refills 3-7 days in advance of running out of medications Perform all self care activities independently  Perform IADL's (shopping, preparing meals, housekeeping, managing finances) independently Call provider office for new concerns or questions   Follow Up Plan:  Telephone follow up appointment with care management team member scheduled for:  02/18/23 2:30 pm The patient has been provided with contact information for the care management team and has been advised to call with any health related questions or concerns.            Discussed VBCI  TOC program and weekly calls to patient to assess condition/status, medication management  and provide support/education as indicated . Patient/ Caregiver voiced understanding and is  agreeable to 30 day program    Routine follow-up and on-going assessment evaluation and education of disease processes, recommended interventions for both chronic and acute medical conditions , will occur during each weekly visit along with ongoing review of symptoms ,medication reviews and reconciliation. Any updates , inconsistencies, discrepancies or acute care concerns will be addressed and routed to the correct Practitioner if indicated   Based on current information and Insurance plan -Reviewed benefits available to patient, including details about eligibility options for care if any area of needs were identified.  Reviewed patients ability to access and / or navigating the benefits system..Amb Referral made if indicted , refer to orders section of note for details   Please refer to Care Plan for goals and interventions -Effectiveness of interventions, symptom management and outcomes will be evaluated  weekly during [facility] 30-day  Program Outreach calls  . Any necessary  changes and updates to Care Plan will be completed episodically    Reviewed goals for care Patient verbalizes understanding of instructions and care plan provided. Patient was encouraged to make informed decisions about their care, actively participate in managing their health condition, and implement lifestyle changes as needed to promote independence and self-management of health care    The patient has been provided with contact information for the care management team and has been advised to call with any health-related questions or concerns.    Susa Loffler , BSN, RN Care Management Coordinator [place]   [facility] [email or web address] Direct Dial: [phone]

## 2023-02-12 NOTE — Patient Instructions (Signed)
Visit Information  Thank you for taking time to visit with me today. Please don't hesitate to contact me if I can be of assistance to you before our next scheduled telephone appointment.  Our next appointment is by telephone on 12/23 at 2:30pm  Following is a copy of your care plan:   Goals Addressed             This Visit's Progress    TOC Care Plan       Current Barriers:  Chronic Disease Management support and education needs related to COPD   RNCM Clinical Goal(s):  Patient will verbalize understanding of plan for management of COPD as evidenced by better control of symptoms  take all medications exactly as prescribed and will call provider for medication related questions as evidenced by no missed medications no unmanaged side effects  attend all scheduled medical appointments: with PCP, Cardiology and Pulmonology and [facility] Practitioners  as evidenced by no missed appointments   through collaboration with RN Care manager, provider, and care team.   Interventions: Evaluation of current treatment plan related to  self management and patient's adherence to plan as established by provider  Transitions of Care:  New goal. Doctor Visits  - discussed the importance of doctor visits  Heart Failure Interventions:  (Status:  New goal.) Short Term Goal Basic overview and discussion of pathophysiology of Heart Failure reviewed Provided education on low sodium diet Assessed need for readable accurate scales in home Provided education about placing scale on hard, flat surface Advised patient to weigh each morning after emptying bladder Reviewed role of diuretics in prevention of fluid overload and management of heart failure; Discussed the importance of keeping all appointments with provider  Patient Goals/Self-Care Activities: Participate in Transition of Care Program/Attend [place] scheduled calls Take all medications as prescribed Attend all scheduled provider appointments Call pharmacy  for medication refills 3-7 days in advance of running out of medications Perform all self care activities independently  Perform IADL's (shopping, preparing meals, housekeeping, managing finances) independently Call provider office for new concerns or questions   Follow Up Plan:  Telephone follow up appointment with care management team member scheduled for:  02/18/23 2:30 pm The patient has been provided with contact information for the care management team and has been advised to call with any health related questions or concerns.             Reviewed goals for care Patient/ Caregiver  verbalizes understanding of instructions and care plan provided. Patient / Caregiver was encouraged to make informed decisions about care, actively participate in managing health conditions, and implement lifestyle changes as needed to promote independence and self-management of health care   VBCI Case Management Nurse will provide follow-up and on-going assessment evaluation and education of disease processes, recommended interventions for both chronic and acute medical conditions ,  along with ongoing review of symptoms ,medication reviews and reconciliation during each weekly  call .  Any updates , inconsistencies, discrepancies or acute care concerns will be addressed and routed to the correct Practitioner if indicated    Please call the care guide team at [phone] , or call me directly at the number below if you need to cancel or reschedule your appointment. Three attempts will be made to reach patient if the scheduled call is missed. If we are unable to reach the patient after 3 attempts the case will be closed and no additional outreach attempts will be made.   If you are experiencing  a medical emergency, please call 911 or report to your local emergency department or urgent care.    If you have a non-emergency medical problem during routine business hours, please contact your provider's office  and ask to speak with a nurse.    If you are experiencing a Mental Health or Behavioral Health Crisis or need someone to talk to Please call the Suicide and Crisis Lifeline: 60 You may also call the [place] National Suicide Prevention Lifeline: (213) 080-5859 or TTY: 605-091-4570 TTY 646 847 6058) to talk to a trained counselor.  Additionally you may call the Behavioral Health Crisis Line at 443-049-5444, at any time, 24 hours a day, 7 days a week- however If you are in danger or need immediate medical attention, call 911.   If you would like help to quit smoking, call 1-800-QUIT-NOW ( (984)401-6942) OR Espaol: 1-855-Djelo-Ya (4-403-474-2595) o para ms informacin haga clic aqu or Text READY to 638-756 to register via text.  Susa Loffler , BSN, RN Care Management Coordinator [place]   [facility] [email or web address] Direct Dial: [phone]

## 2023-02-13 DIAGNOSIS — M87059 Idiopathic aseptic necrosis of unspecified femur: Secondary | ICD-10-CM | POA: Diagnosis not present

## 2023-02-14 NOTE — Consult Note (Signed)
   [facility] CM Inpatient Consult   02/14/2023  Nicole Austin 01/22/1961 [id number]  Primary Care Provider:  Hartford Poli, [facility]  Patient is currently active with Care Management for chronic disease management services.  Patient has been engaged by a  Presenter, broadcasting.  Our community based plan of care has focused on disease management and community resource support.   Patient will receive a post hospital call and will be evaluated for assessments and disease process education.   Of note, Care Management services does not replace or interfere with any services that are needed or arranged by inpatient [facility] care management team.   For additional questions or referrals please contact:  Elliot Cousin, RN, [facility] Liaison [place]   [facility], Population Health Office Hours MTWF  8:00 am-6:00 pm Direct Dial: [phone] mobile [phone] [Office toll free line] Office Hours are M-F 8:30 - 5 pm [email or web address]

## 2023-02-15 ENCOUNTER — Inpatient Hospital Stay: Payer: 59 | Admitting: Physician Assistant

## 2023-02-18 ENCOUNTER — Other Ambulatory Visit: Payer: Self-pay

## 2023-02-18 NOTE — Patient Instructions (Signed)
Visit Information  Thank you for taking time to visit with me today. Please don't hesitate to contact me if I can be of assistance to you before our next scheduled telephone appointment.  Our next appointment is by telephone on 02/26/23 at 12:30pm  Following is a copy of your care plan:   Goals Addressed             This Visit's Progress    TOC Care Plan       Current Barriers:  Chronic Disease Management support and education needs related to COPD   RNCM Clinical Goal(s):  Patient will verbalize understanding of plan for management of COPD as evidenced by better control of symptoms  take all medications exactly as prescribed and will call provider for medication related questions as evidenced by no missed medications no unmanaged side effects  attend all scheduled medical appointments: with PCP- rescheduled 03/19/23, Cardiology and Pulmonology and [facility] Practitioners 1/30, 1/31   as evidenced by no missed appointments   through collaboration with RN Care manager, provider, and care team.   Interventions: Evaluation of current treatment plan related to  self management and patient's adherence to plan as established by provider  Transitions of Care:  New goal. Doctor Visits  - discussed the importance of doctor visits  Heart Failure Interventions:  (Status:  Goal on track:  Yes.) Short Term Goal Basic overview and discussion of pathophysiology of Heart Failure reviewed Provided education on low sodium diet Assessed need for readable accurate scales in home Provided education about placing scale on hard, flat surface Advised patient to weigh each morning after emptying bladder Reviewed role of diuretics in prevention of fluid overload and management of heart failure; Discussed the importance of keeping all appointments with provider  Patient Goals/Self-Care Activities: Participate in Transition of Care Program/Attend [facility] scheduled calls Take all medications as prescribed Attend all  scheduled provider appointments Call pharmacy for medication refills 3-7 days in advance of running out of medications Perform all self care activities independently  Perform IADL's (shopping, preparing meals, housekeeping, managing finances) independently Call provider office for new concerns or questions   Follow Up Plan:  Telephone follow up appointment with care management team member scheduled for:  02/26/23 12:30 pm The patient has been provided with contact information for the care management team and has been advised to call with any health related questions or concerns.            Reviewed goals for care Patient/ Caregiver  verbalizes understanding of instructions and care plan provided. Patient / Caregiver was encouraged to make informed decisions about care, actively participate in managing health conditions, and implement lifestyle changes as needed to promote independence and self-management of their medical care   VBCI Case Management Nurse will provide follow-up and on-going assessment ,evaluation and education of disease processes, recommended interventions for both chronic and acute medical conditions ,  along with ongoing review of symptoms ,medication reviews / reconciliation during each weekly call . Any updates , inconsistencies, discrepancies or acute care concerns will be addressed and routed to the correct Practitioner if indicated    Please call the care guide team at [phone]  if you need to cancel or reschedule your appointment . For scheduled calls -Three attempts will be made to reach you if the scheduled call is missed or  we are unable to reach the you  after 3 attempts the case will be closed and no additional outreach attempts will be made.  If you need to speak to a Nurse you may  call me directly at the number below or if I am unavailable,and  your need is urgent  please call the main VBCI number at [phone] and ask to speak with one of the [facility] (  Transition of Care )  Nurses  .     Additionally, If you experience worsening of your symptoms, develop shortness of breath, If you are experiencing a medical emergency,  develop suicidal or homicidal thoughts you must seek medical attention immediately by calling 911 or report to your local emergency department or urgent care.   If you have a non-emergency medical problem during routine business hours, please contact your provider's office and ask to speak with a nurse.       Please take the time to read instructions/literature along with the possible adverse reactions/side effects for all the Medicines that have been prescribed to you. Only take newly prescribed  Medications after you have completely understood and accept all the possible adverse reactions/side effects.   Do not take more than prescribed Medications for  Pain, Sleep and Anxiety. Do not drive when taking Pain medications or sleep aid/ insomnia  medications It is not advisable to combine anxiety, sleep and pain medications without talking with your primary care practitioner    If you are experiencing a Mental Health or Behavioral Health Crisis or need someone to talk to Please call the Suicide and Crisis Lifeline: 988 You may also call the [place] National Suicide Prevention Lifeline: 559-610-5834 or TTY: 414-826-6635 TTY 669-110-0439) to talk to a trained counselor.  You may call the Behavioral Health Crisis Line at 989-721-5679, at any time, 24 hours a day, 7 days a week- however If you are in danger or need immediate medical attention, call 911.   If you would like help to quit smoking, call 1-800-QUIT-NOW ( (352)580-2146) OR Espaol: 1-855-Djelo-Ya (9-563-875-6433) o para ms informacin haga clic aqu or Text READY to 295-188 to register via text.   Susa Loffler , BSN, RN Care Management Coordinator    [facility] [email or web address] Direct Dial: [phone]

## 2023-02-18 NOTE — Patient Outreach (Signed)
Care Management  Transitions of Care Program Transitions of Care Post-discharge week 2   02/18/2023 Name: Nicole Austin MRN: [id number] DOB: Jun 08, 1960  Subjective: Nicole Austin is a 62 y.o. year old female who is a primary care patient of Danelle Berry, [place]. The Care Management team Engaged with patient Engaged with patient by telephone to assess and address transitions of care needs.   Consent to Services:  Patient was given information about care management services, agreed to services, and gave verbal consent to participate.   Assessment:     Patient voices no new complaints Patient has not developed/ reported any new Medical issues / Dx or acute changes.- since last follow-up call for most recent  Hospital stay    02/08/23-12/16 / 2024  She reports she is doing much better, She feels "fine". She is almost done her tapering Prednisone and is continuing to take her Na+ tabs . Her URI s/s are resolved .She continues on 3 l/ m of O2 and uses it mostly at night. She follows up with MH counseling regularly. Her PCP appt was rescheduled to 03/19/23.  She is back to usual routine and offers no complaints  Patient educated on red flag s/s to watch for and was encouraged to report, any changes in baseline or  medication regimen,  changes in health status  /  well-being, safety concerns  or any new unmanaged side effects or symptoms not relieved with interventions  to PCP and / or the  [facility] Case Management team        SDOH Interventions    Flowsheet Row Telephone from 02/12/2023 in [place] POPULATION HEALTH DEPARTMENT Care Coordination from 12/07/2022 in [facility] Community Care Coordination Care Coordination from 11/26/2022 in [facility] Community Care Coordination Office Visit from 06/14/2022 in [facility] Video Visit from 02/08/2022 in [facility] Clinical Support from 03/21/2021 in [facility]  SDOH Interventions        Food Insecurity Interventions Intervention Not Indicated, Community Resources Provided  [employer] food stamps] Intervention Not Indicated, Other (Comment)  [Food stamps] Intervention Not Indicated -- -- --  Housing Interventions Intervention Not Indicated Intervention Not Indicated Intervention Not Indicated -- -- --  Transportation Interventions Intervention Not Indicated Intervention Not Indicated, Other (Comment)  [UHC transportation and [employer]] Intervention Not Indicated -- -- --  Utilities Interventions Intervention Not Indicated Intervention Not Indicated -- -- -- --  Depression Interventions/Treatment  -- -- -- Referral to Psychiatry Currently on Treatment Medication, Currently on Treatment  Physical Activity Interventions -- -- -- -- -- Intervention Not Indicated  Stress Interventions -- -- -- -- -- Intervention Not Indicated  Social Connections Interventions -- -- -- -- -- Intervention Not Indicated        Goals Addressed             This Visit's Progress    TOC Care Plan       Current Barriers:  Chronic Disease Management support and education needs related to COPD   RNCM Clinical Goal(s):  Patient will verbalize understanding of plan for management of COPD as evidenced by better control of symptoms  take all medications exactly as prescribed and will call provider for medication related questions as evidenced by no missed medications no unmanaged side effects  attend all scheduled medical appointments: with PCP- rescheduled 03/19/23, Cardiology and Pulmonology and [facility] Practitioners 1/30, 1/31   as evidenced by no missed appointments  through collaboration with Medical illustrator, provider, and care team.   Interventions: Evaluation of current treatment plan related to  self management and patient's adherence to plan as established by provider  Transitions of Care:  New goal. Doctor Visits  - discussed the  importance of doctor visits  Heart Failure Interventions:  (Status:  Goal on track:  Yes.) Short Term Goal Basic overview and discussion of pathophysiology of Heart Failure reviewed Provided education on low sodium diet Assessed need for readable accurate scales in home Provided education about placing scale on hard, flat surface Advised patient to weigh each morning after emptying bladder Reviewed role of diuretics in prevention of fluid overload and management of heart failure; Discussed the importance of keeping all appointments with provider  Patient Goals/Self-Care Activities: Participate in Transition of Care Program/Attend [facility] scheduled calls Take all medications as prescribed Attend all scheduled provider appointments Call pharmacy for medication refills 3-7 days in advance of running out of medications Perform all self care activities independently  Perform IADL's (shopping, preparing meals, housekeeping, managing finances) independently Call provider office for new concerns or questions   Follow Up Plan:  Telephone follow up appointment with care management team member scheduled for:  02/26/23 12:30 pm The patient has been provided with contact information for the care management team and has been advised to call with any health related questions or concerns.             Plan:  Routine follow-up and on-going assessment evaluation and education of disease processes, recommended interventions for both chronic and acute medical conditions , will occur during each weekly visit along with ongoing review of symptoms ,medication reviews and reconciliation. Any updates , inconsistencies, discrepancies or acute care concerns will be addressed and routed to the correct Practitioner if indicated   Based on current information and Insurance plan -Reviewed benefits available to patient, including details about eligibility options for care if any area of needs were identified.  Reviewed  patients ability to access and / or navigating the benefits system..Amb Referral made if indicted , refer to orders section of note for details   Please refer to Care Plan for goals and interventions -Effectiveness of interventions, symptom management and outcomes will be evaluated  weekly during [name] 30-day Program Outreach calls  . Any necessary  changes and updates to Care Plan will be completed episodically    Reviewed goals for care Patient verbalizes understanding of instructions and care plan provided. Patient was encouraged to make informed decisions about their care, actively participate in managing their health condition, and implement lifestyle changes as needed to promote independence and self-management of health care    The patient has been provided with contact information for the care management team and has been advised to call with any health-related questions or concerns.   The patient has been provided with contact information for the care management team and has been advised to call with any health related questions or concerns.   Susa Loffler , BSN, RN Care Management Coordinator [place]   [facility] [email or web address] Direct Dial: [phone]

## 2023-02-21 ENCOUNTER — Other Ambulatory Visit: Payer: Self-pay | Admitting: Physician Assistant

## 2023-02-26 ENCOUNTER — Other Ambulatory Visit: Payer: Self-pay

## 2023-02-26 NOTE — Patient Outreach (Signed)
  Care Management  Transitions of Care Program Transitions of Care Post-discharge week 3  02/26/2023 Name: Nicole Austin MRN: [id number] DOB: 11-May-1960  Subjective: Nicole Austin is a 62 y.o. year old female who is a primary care patient of Tapia, [name], PA-C. The Care Management team was unable to reach the patient by phone to assess and address transitions of care needs.   Plan: Additional outreach attempts will be made to reach the patient enrolled in the [facility] Program (Post Inpatient/ED Visit).  Bari Mayans , BSN, RN Care Management Coordinator [place]   [facility] [email or web address] Direct Dial: [phone]

## 2023-02-26 NOTE — Telephone Encounter (Signed)
 Requested medication (s) are due for refill today: yes  Requested medication (s) are on the active medication list: yes  Last refill:  01/23/23 #120  Future visit scheduled: yes  Notes to clinic:  med not delegated to NT to RF   Requested Prescriptions  Pending Prescriptions Disp Refills   pregabalin  (LYRICA ) 25 MG capsule [Pharmacy Med Name: PREGABALIN  25MG  CAPSULE] 120 capsule 0    Sig: TAKE TWO CAPSULES (50 MG TOTAL) BY MOUTH TWO (TWO) TIMES DAILY.     Not Delegated - Neurology:  Anticonvulsants - Controlled - pregabalin  Failed - 02/26/2023 12:18 PM      Failed - This refill cannot be delegated      Passed - Cr in normal range and within 360 days    Creat  Date Value Ref Range Status  07/31/2022 1.05 0.50 - 1.05 mg/dL Final   Creatinine, Ser  Date Value Ref Range Status  02/11/2023 0.90 0.44 - 1.00 mg/dL Final   Creatinine, Urine  Date Value Ref Range Status  10/04/2015 CANCELED 20 - 320 mg/dL     Comment:    Test not performed, no urine was received.    Result canceled by the ancillary   10/04/2015 128 20 - 320 mg/dL Final         Passed - Completed PHQ-2 or PHQ-9 in the last 360 days      Passed - Valid encounter within last 12 months    Recent Outpatient Visits           3 weeks ago Chronic obstructive pulmonary disease with acute exacerbation [facility])   [facility] Mecum, Rocky BRAVO, PA-C   3 months ago Tremor   [facility] [place], Michelene, PA-C   5 months ago Unspecified abnormalities of gait and mobility   [facility] Michelene, PA-C   7 months ago Encounter for examination following treatment at hospital   [facility] Michelene, PA-C   8 months ago [facility]   [facility] Bernardo Fend, DO       Future Appointments             In 3 weeks Mecum, Erin E, PA-C [facility], [facility]

## 2023-02-28 ENCOUNTER — Telehealth: Payer: Self-pay

## 2023-02-28 NOTE — Patient Instructions (Signed)
 Visit Information  Thank you for taking time to visit with me today. Please don't hesitate to contact me if I can be of assistance to you before our next scheduled telephone appointment.  Our next appointment is by telephone on 03/07/23 at 10:00am  Following is a copy of your care plan:   Goals Addressed             This Visit's Progress    TOC Care Plan       Current Barriers:  Chronic Disease Management support and education needs related to COPD   RNCM Clinical Goal(s):  Patient will verbalize understanding of plan for management of COPD as evidenced by better control of symptoms  take all medications exactly as prescribed and will call provider for medication related questions as evidenced by no missed medications no unmanaged side effects  attend all scheduled medical appointments: with PCP- rescheduled 03/19/23, Cardiology and Pulmonology and [facility] Practitioners 1/30, 1/31   as evidenced by no missed appointments   through collaboration with RN Care manager, provider, and care team.   Interventions: Evaluation of current treatment plan related to  self management and patient's adherence to plan as established by provider  Transitions of Care:  Goal on track:  Yes. Doctor Visits  - discussed the importance of doctor visits Post discharge activity limitations prescribed by provider reviewed  Heart Failure Interventions:  (Status:  Goal on track:  Yes.) Short Term Goal Basic overview and discussion of pathophysiology of Heart Failure reviewed Provided education on low sodium diet Assessed need for readable accurate scales in home Provided education about placing scale on hard, flat surface Advised patient to weigh each morning after emptying bladder Reviewed role of diuretics in prevention of fluid overload and management of heart failure; Discussed the importance of keeping all appointments with provider  Patient Goals/Self-Care Activities: Participate in Transition of Care  Program/Attend [facility] scheduled calls Take all medications as prescribed Attend all scheduled provider appointments Call pharmacy for medication refills 3-7 days in advance of running out of medications Perform all self care activities independently  Perform IADL's (shopping, preparing meals, housekeeping, managing finances) independently Call provider office for new concerns or questions   Follow Up Plan:  Telephone follow up appointment with care management team member scheduled for:  03/07/23 10:00am The patient has been provided with contact information for the care management team and has been advised to call with any health related questions or concerns.              Reviewed goals for care Patient/ Caregiver  verbalizes understanding of instructions and care plan provided. Patient / Caregiver was encouraged to make informed decisions about care, actively participate in managing health conditions, and implement lifestyle changes as needed to promote independence and self-management of their medical care   VBCI Case Management Nurse will provide follow-up and on-going assessment ,evaluation and education of disease processes, recommended interventions for both chronic and acute medical conditions ,  along with ongoing review of symptoms ,medication reviews / reconciliation during each weekly call . Any updates , inconsistencies, discrepancies or acute care concerns will be addressed and routed to the correct Practitioner if indicated    Please call the care guide team at [phone]  if you need to cancel or reschedule your appointment . For scheduled calls -Three attempts will be made to reach you if the scheduled call is missed or  we are unable to reach the you  after 3 attempts the case will  be closed and no additional outreach attempts will be made.   If you need to speak to a Nurse you may  call me directly at the number below or if I am unavailable,and  your need is urgent  please  call the main VBCI number at [phone] and ask to speak with one of the [facility] ( Transition of Care )  Nurses  .     Additionally, If you experience worsening of your symptoms, develop shortness of breath, If you are experiencing a medical emergency,  develop suicidal or homicidal thoughts you must seek medical attention immediately by calling 911 or report to your local emergency department or urgent care.   If you have a non-emergency medical problem during routine business hours, please contact your provider's office and ask to speak with a nurse.       Please take the time to read instructions/literature along with the possible adverse reactions/side effects for all the Medicines that have been prescribed to you. Only take newly prescribed  Medications after you have completely understood and accept all the possible adverse reactions/side effects.   Do not take more than prescribed Medications for  Pain, Sleep and Anxiety. Do not drive when taking Pain medications or sleep aid/ insomnia  medications It is not advisable to combine anxiety, sleep and pain medications without talking with your primary care practitioner    If you are experiencing a Mental Health or Behavioral Health Crisis or need someone to talk to Please call the Suicide and Crisis Lifeline: 14 You may also call the [place]  National Suicide Prevention Lifeline: 5157881295 or TTY: 801-784-2225 TTY (857) 456-9067) to talk to a trained counselor.  You may call the Behavioral Health Crisis Line at (810)005-3710, at any time, 24 hours a day, 7 days a week- however If you are in danger or need immediate medical attention, call 911.   If you would like help to quit smoking, call 1-800-QUIT-NOW ( (561)616-3963) OR Espaol: 1-855-Djelo-Ya (8-144-664-6430) o para ms informacin haga clic aqu or Text READY to 799-599 to register via text.   Bari Mayans , BSN, RN Care Management Coordinator [place]   [facility] [email or web address] Direct Dial: [phone]

## 2023-02-28 NOTE — Patient Outreach (Signed)
 Care Management  Transitions of Care Program Transitions of Care Post-discharge week 3   02/28/2023 Name: Nicole Austin MRN: [id number] DOB: 1960/09/20  Subjective: Nicole Austin is a 63 y.o. year old female who is a primary care patient of Tapia, [name], PA-C. The Care Management team Engaged with patient Engaged with patient by telephone to assess and address transitions of care needs.   Consent to Services:  Patient was given information about care management services, agreed to services, and gave verbal consent to participate.   Assessment:   Patient voices no new complaints Patient has not developed/ reported any new Medical issues / Dx or acute changes.- since last follow-up call for most recent  Hospital stay    12/13-12/16/ 2024  She is doing very well In great spirits. Followed up with Psychiatry and Talk therapist. She rescheduled PCP appointment for 03/19/23. She completed her Prednisone  taper. Stated Mom's meals were delivered today. She denies any SDOH Barriers. Uses [employer] for transportation. She denies any SOB or DOE. No significant edema or weight gain  Medication reconciliation/ review completed based on most recent medication list in EHR; and in review of recent Provider follow-up appointments- confirmed patient obtained / is taking all newly prescribed medications as instructed and is aware of any changes to previous medications regime including dosage adjustments. Patient self-manages medications; denies questions/ concerns or barriers to medication adherence at this time. Patient educated on red flag s/s to watch for and was encouraged to report, any changes in baseline or  medication regimen,  changes in health status  /  well-being, safety concerns  or any new unmanaged side effects or symptoms not relieved with interventions  to PCP and / or the  VBCI Case Management team          SDOH Interventions    Flowsheet Row Telephone from 02/28/2023 in [place]  [facility]PART[facility] Telephone from 02/12/2023 in [place] POPULATION HEALTH DEPARTMENT Care Coordination from 12/07/2022 in [facility] Community Care Coordination Care Coordination from 11/26/2022 in [facility] Community Care Coordination Office Visit from 06/14/2022 in [facility] Video Visit from 02/08/2022 in [facility]  SDOH Interventions        Food Insecurity Interventions -- Intervention Not Indicated, Community Resources Provided  [employer] food stamps] Intervention Not Indicated, Other (Comment)  [Food stamps] Intervention Not Indicated -- --  Housing Interventions -- Intervention Not Indicated Intervention Not Indicated Intervention Not Indicated -- --  Transportation Interventions -- Intervention Not Indicated Intervention Not Indicated, Other (Comment)  [UHC transportation and [employer]] Intervention Not Indicated -- --  Utilities Interventions -- Intervention Not Indicated Intervention Not Indicated -- -- --  Depression Interventions/Treatment  -- -- -- -- Referral to Psychiatry Currently on Treatment  Social Connections Interventions Intervention Not Indicated, Patient Declined -- -- -- -- --        Goals Addressed             This Visit's Progress    TOC Care Plan       Current Barriers:  Chronic Disease Management support and education needs related to COPD   RNCM Clinical Goal(s):  Patient will verbalize understanding of plan for management of COPD as evidenced by better control of symptoms  take all medications exactly as prescribed and will call provider for medication related questions as evidenced by no missed medications no unmanaged side effects  attend all scheduled medical appointments: with PCP- rescheduled  03/19/23, Cardiology and Pulmonology and [facility] Practitioners 1/30, 1/31   as evidenced by no missed appointments   through collaboration with RN Care  manager, provider, and care team.   Interventions: Evaluation of current treatment plan related to  self management and patient's adherence to plan as established by provider  Transitions of Care:  Goal on track:  Yes. Doctor Visits  - discussed the importance of doctor visits Post discharge activity limitations prescribed by provider reviewed  Heart Failure Interventions:  (Status:  Goal on track:  Yes.) Short Term Goal Basic overview and discussion of pathophysiology of Heart Failure reviewed Provided education on low sodium diet Assessed need for readable accurate scales in home Provided education about placing scale on hard, flat surface Advised patient to weigh each morning after emptying bladder Reviewed role of diuretics in prevention of fluid overload and management of heart failure; Discussed the importance of keeping all appointments with provider  Patient Goals/Self-Care Activities: Participate in Transition of Care Program/Attend [facility] scheduled calls Take all medications as prescribed Attend all scheduled provider appointments Call pharmacy for medication refills 3-7 days in advance of running out of medications Perform all self care activities independently  Perform IADL's (shopping, preparing meals, housekeeping, managing finances) independently Call provider office for new concerns or questions   Follow Up Plan:  Telephone follow up appointment with care management team member scheduled for:  03/07/23 10:00am The patient has been provided with contact information for the care management team and has been advised to call with any health related questions or concerns.             Plan: The patient has been provided with contact information for the care management team and has been advised to call with any health related questions or concerns.  Routine follow-up and on-going assessment evaluation and education of disease processes, recommended interventions for both  chronic and acute medical conditions , will occur during each weekly visit along with ongoing review of symptoms ,medication reviews and reconciliation. Any updates , inconsistencies, discrepancies or acute care concerns will be addressed and routed to the correct Practitioner if indicated   Based on current information and Insurance plan -Reviewed benefits available to patient, including details about eligibility options for care if any area of needs were identified.  Reviewed patients ability to access and / or navigating the benefits system..Amb Referral made if indicted , refer to orders section of note for details   Please refer to Care Plan for goals and interventions -Effectiveness of interventions, symptom management and outcomes will be evaluated  weekly during [facility] 30-day Program Outreach calls  . Any necessary  changes and updates to Care Plan will be completed episodically    Reviewed goals for care Patient verbalizes understanding of instructions and care plan provided. Patient was encouraged to make informed decisions about their care, actively participate in managing their health condition, and implement lifestyle changes as needed to promote independence and self-management of health care    The patient has been provided with contact information for the care management team and has been advised to call with any health-related questions or concerns.   Bari Mayans , BSN, RN Care Management Coordinator [place]   [facility] [email or web address] Direct Dial: [phone]

## 2023-03-05 ENCOUNTER — Ambulatory Visit: Payer: Self-pay | Admitting: *Deleted

## 2023-03-05 NOTE — Patient Outreach (Signed)
  Care Coordination   Follow Up Visit Note   03/05/2023 Name: Nicole Austin MRN: [id number] DOB: 11/25/1960  Nicole Austin is a 63 y.o. year old female who sees Tapia, [name], PA-C for primary care. I spoke with  Montie Velia Kiker by phone today.  What matters to the patients health and wellness today?  Patient was recently admitted to hospital with COPD after being diagnosed with Covid.  Report she is much better, denies any trouble breathing.  Her broken toe has healed, back pain is better.  Denies any urgent concerns, encouraged to contact this care manager with questions.      Goals Addressed             This Visit's Progress    Effective management of chronic medical conditions   On track    Interventions Today    Flowsheet Row Most Recent Value  Chronic Disease   Chronic disease during today's visit Chronic Obstructive Pulmonary Disease (COPD), Congestive Heart Failure (CHF), Other  [chronic back pain and recent Covid diagnosis]  General Interventions   General Interventions Discussed/Reviewed General Interventions Reviewed, Doctor Visits, Level of Care, [employer]  [State she is receiving moms meals]  Doctor Visits Discussed/Reviewed Doctor Visits Reviewed, PCP, Specialist  [PCP scheduled for 1/10, she will ask provider to send referral to pain specialist for chronic pain]  PCP/Specialist Visits Compliance with follow-up visit  Level of Care Personal Care Services  [Report aides have restarted, coming days a week]  Education Interventions   Education Provided Provided Education  Provided Verbal Education On Medication, When to see the doctor              SDOH assessments and interventions completed:  No     Care Coordination Interventions:  Yes, provided   Follow up plan: Follow up call scheduled for 2/5    Encounter Outcome:  Patient Visit Completed   Nicole Ku, RN, MSN, CCM [place]  [facility], [facility]  Health RN Care Coordinator Direct Dial: [phone] / Main [phone] Fax [phone] Email: [email or web address]

## 2023-03-07 ENCOUNTER — Other Ambulatory Visit: Payer: Self-pay

## 2023-03-07 DIAGNOSIS — J441 Chronic obstructive pulmonary disease with (acute) exacerbation: Secondary | ICD-10-CM | POA: Diagnosis not present

## 2023-03-07 DIAGNOSIS — J449 Chronic obstructive pulmonary disease, unspecified: Secondary | ICD-10-CM | POA: Diagnosis not present

## 2023-03-07 NOTE — Patient Instructions (Signed)
 Visit Information  Thank you for taking time to visit with me today. Please don't hesitate to contact me if I can be of assistance to you before our next scheduled telephone appointment.    Following is a copy of your care plan:   Goals Addressed             This Visit's Progress    COMPLETED: TOC Care Plan       Current Barriers:  Chronic Disease Management support and education needs related to COPD   RNCM Clinical Goal(s):  Patient will verbalize understanding of plan for management of COPD as evidenced by better control of symptoms  take all medications exactly as prescribed and will call provider for medication related questions as evidenced by no missed medications no unmanaged side effects  attend all scheduled medical appointments: with PCP- rescheduled 03/19/23, Cardiology and Pulmonology and [facility] Practitioners 1/30, 1/31   as evidenced by no missed appointments   through collaboration with RN Care manager, provider, and care team.   Interventions: Evaluation of current treatment plan related to  self management and patient's adherence to plan as established by provider  Transitions of Care:  Goal Met. Doctor Visits  - discussed the importance of doctor visits  Longitudinal Nurse Case Manager for Ongoing follow-up is currently followed  Post discharge activity limitations prescribed by provider reviewed  Heart Failure Interventions:  (Status:  Goal Met.) Short Term Goal Basic overview and discussion of pathophysiology of Heart Failure reviewed Provided education on low sodium diet Assessed need for readable accurate scales in home Provided education about placing scale on hard, flat surface Advised patient to weigh each morning after emptying bladder Reviewed role of diuretics in prevention of fluid overload and management of heart failure; Discussed the importance of keeping all appointments with provider  Patient Goals/Self-Care Activities: Participate in Transition  of Care Program/Attend TOC scheduled calls Take all medications as prescribed Attend all scheduled provider appointments Call pharmacy for medication refills 3-7 days in advance of running out of medications Perform all self care activities independently  Perform IADL's (shopping, preparing meals, housekeeping, managing finances) independently Call provider office for new concerns or questions   Follow Up Plan:  The patient has been provided with contact information for the care management team and has been advised to call with any health related questions or concerns.            Reviewed goals for care Patient/ Caregiver verbalizes understanding of instructions with the plan of care . The  Patient / Caregiver was encouraged to make informed decisions about care, actively participate in managing health conditions, and implement lifestyle changes as needed to promote independence and self-management of healthcare. SDOH screenings have been completed and addressed if indicted There are no reported barriers to care.    Follow-up Plan [facility] Case Management Nurse will provide follow-up and on-going assessment ,evaluation and education of disease processes, recommended interventions for both chronic and acute medical conditions ,  along with ongoing review of symptoms ,medication reviews / reconciliation during each weekly call . Any updates , inconsistencies, discrepancies or acute care concerns will be addressed and routed to the correct Practitioner if indicated   [facility]  Please call the care guide team at [phone]  if you need to cancel or reschedule your appointment . For scheduled calls -Three attempts will be made to reach you -if the scheduled call is missed or  we are unable to reach the you after  3 attempts no additional outreach attempts will be made and the TOC follow-up will be closed .   If you need to speak to a Nurse you may  call me directly at the number  below or if I am unavailable,and  your need is urgent  please call the main [facility] number at [phone] and ask to speak with one of the [facility] ( Transition of Care )  Nurses  .                                                                               Additionally, If you experience worsening of your symptoms, develop shortness of breath, If you are experiencing a medical emergency,  develop suicidal or homicidal thoughts you must seek medical attention immediately by calling 911 or report to your local emergency department or urgent care.   If you have a non-emergency medical problem during routine business hours, please contact your provider's office and ask to speak with a nurse.       Please take the time to read instructions/literature along with the possible adverse reactions/side effects for all the Medicines that have been prescribed to you. Only take newly prescribed  Medications after you have completely understood and accept all the possible adverse reactions/side effects.   Do not take more than prescribed Medications for  Pain, Sleep and Anxiety. Do not drive when taking Pain medications or sleep aid/ insomnia  medications It is not advisable to combine anxiety, sleep and pain medications without talking with your primary care practitioner    If you are experiencing a Mental Health or Behavioral Health Crisis or need someone to talk to Please call the Suicide and Crisis Lifeline: 44 You may also call the [place]  National Suicide Prevention Lifeline: (516) 608-8465 or TTY: 769-095-5768 TTY 757-006-2347) to talk to a trained counselor.  You may call the Behavioral Health Crisis Line at 339 466 4520, at any time, 24 hours a day, 7 days a week- however If you are in danger or need immediate medical attention, call 911.   If you would like help to quit smoking, call 1-800-QUIT-NOW ( 585-600-1001) OR Espaol: 1-855-Djelo-Ya (8-144-664-6430) o para ms informacin haga clic aqu or  Text READY to 200-400 to register via text.   Bari Mayans , BSN, RN Care Management Coordinator [place]   [facility] [email or web address] Direct Dial: [phone]

## 2023-03-07 NOTE — Patient Outreach (Signed)
 Care Management  Transitions of Care Program Transitions of Care Post-discharge week 3   03/07/2023 Name: Nicole Austin MRN: [id number] DOB: [birth year]  Subjective: Nicole Austin is a 63 y.o. year old female who is a primary care patient of Tapia, [name], PA-C. The Care Management team Engaged with patient Engaged with patient by telephone to assess and address transitions of care needs.   Consent to Services:  Patient was given information about care management services, agreed to services, and gave verbal consent to participate.   Assessment:     Patient voices no new complaints Patient has not developed/ reported any new Medical issues / Dx or acute changes.- since last follow-up call for most recent  Austin stay    12/13-12/16  / 2024  She is doing well, competed her Prednisone  taper, She continues with Na tabs. She had PCP appointment 03/08/23 the office rescheduled for 03/15/23 due to potential weather. She continue with her MH counseling sessions Is in good spirits engaging and positive   Medication reconciliation / review completed based on most recent discharge summary and EHR medication list. Confirmed patient is taking all newly prescribed medications as instructed and is aware of any changes to and / or dosage adjustments to medication regimen. Patient denies questions at this time  and reports no barriers to medication adherence  Patient educated on red flag s/s to watch for and was encouraged to report, any changes in baseline or  medication regimen,  changes in health status  /  well-being, safety concerns  or any new unmanaged side effects or symptoms not relieved with interventions  to PCP and / or the  [facility] Case Management team         SDOH Interventions    Flowsheet Row Telephone from 02/28/2023 in [place] [facility] Telephone from 02/12/2023 in [place] [facility] DEPARTMENT Care Coordination from 12/07/2022 in [facility] Care Coordination Care Coordination from 11/26/2022 in [facility] Care Coordination Office Visit from 06/14/2022 in [facility] Video Visit from 02/08/2022 in [facility]  SDOH Interventions        Food Insecurity Interventions -- Intervention Not Indicated, Community Resources Provided  [employer] food stamps] Intervention Not Indicated, Other (Comment)  [Food stamps] Intervention Not Indicated -- --  Housing Interventions -- Intervention Not Indicated Intervention Not Indicated Intervention Not Indicated -- --  Transportation Interventions -- Intervention Not Indicated Intervention Not Indicated, Other (Comment)  [[employer] transportation and [employer]] Intervention Not Indicated -- --  Utilities Interventions -- Intervention Not Indicated Intervention Not Indicated -- -- --  Depression Interventions/Treatment  -- -- -- -- Referral to Psychiatry Currently on Treatment  Social Connections Interventions Intervention Not Indicated, Patient Declined -- -- -- -- --        Goals Addressed             This Visit's Progress    COMPLETED: TOC Care Plan       Current Barriers:  Chronic Disease Management support and education needs related to COPD   RNCM Clinical Goal(s):  Patient will verbalize understanding of plan for management of COPD as evidenced by better control of symptoms  take all medications exactly as prescribed and will call provider for medication related questions as evidenced by no missed medications no unmanaged side effects  attend all scheduled medical appointments: with PCP- rescheduled 03/19/23, Cardiology and Pulmonology and [facility] Practitioners 1/30, 1/31   as  evidenced by no missed appointments   through collaboration with Nicole Care manager, provider, and care team.   Interventions: Evaluation of current treatment plan related to  self management and patient's adherence to plan as  established by provider  Transitions of Care:  Goal Met. Doctor Visits  - discussed the importance of doctor visits  Longitudinal Nurse Case Manager for Ongoing follow-up is currently followed  Post discharge activity limitations prescribed by provider reviewed  Heart Failure Interventions:  (Status:  Goal Met.) Short Term Goal Basic overview and discussion of pathophysiology of Heart Failure reviewed Provided education on low sodium diet Assessed need for readable accurate scales in home Provided education about placing scale on hard, flat surface Advised patient to weigh each morning after emptying bladder Reviewed role of diuretics in prevention of fluid overload and management of heart failure; Discussed the importance of keeping all appointments with provider  Patient Goals/Self-Care Activities: Participate in Transition of Care Program/Attend TOC scheduled calls Take all medications as prescribed Attend all scheduled provider appointments Call pharmacy for medication refills 3-7 days in advance of running out of medications Perform all self care activities independently  Perform IADL's (shopping, preparing meals, housekeeping, managing finances) independently Call provider office for new concerns or questions   Follow Up Plan:  The patient has been provided with contact information for the care management team and has been advised to call with any health related questions or concerns.             Plan:  The patient has successfully TOC Program. Condition is stable She is currently followed by [facility] CCM and appointments are scheduled. Will defer to those services to avoid duplication of services   No further acute needs identified at this time. Chronic conditions and ongoing care is  managed thru collaboration with [facility] Longitudinal Nurse  PCP,  Specialists and additional Healthcare Providers if indicated . Patient verbalized understanding of ongoing plan of care.  SDOH needs  have been screened and interventions provided if identified.  Reviewed current home medications -- provided education as needed.  Previously discussed rationale of use, how/when to take medications. Patient is aware of potential side effects, and was encouraged to notify PCP for any changes in condition or signs / symptoms not relieved  with interventions.   Patient will call 911 for Medical Emergencies or Life -Threatening or report to a local emergency department or urgent care.   Patient was encouraged to Contact PCP  with any questions or concerns regarding ongoing  medical care, any  difficulty obtaining or picking up  prescriptions, any  changes or  worsening in  condition including signs / symptoms not relieved  with interventions  Patient had no additional questions or concerns at this time. Current needs addressed.    The patient has been provided with contact information for the care management team and has been advised to call with any health related questions or concerns.   Nicole Austin , Nicole Austin, Nicole Austin   Nicole [name] Direct Dial: [phone]

## 2023-03-08 ENCOUNTER — Inpatient Hospital Stay: Payer: 59 | Admitting: Physician Assistant

## 2023-03-09 DIAGNOSIS — J449 Chronic obstructive pulmonary disease, unspecified: Secondary | ICD-10-CM | POA: Diagnosis not present

## 2023-03-09 DIAGNOSIS — J441 Chronic obstructive pulmonary disease with (acute) exacerbation: Secondary | ICD-10-CM | POA: Diagnosis not present

## 2023-03-15 ENCOUNTER — Ambulatory Visit (INDEPENDENT_AMBULATORY_CARE_PROVIDER_SITE_OTHER): Payer: 59 | Admitting: Family Medicine

## 2023-03-15 ENCOUNTER — Telehealth: Payer: Self-pay | Admitting: Family Medicine

## 2023-03-15 ENCOUNTER — Encounter: Payer: Self-pay | Admitting: Family Medicine

## 2023-03-15 VITALS — BP 122/86 | HR 98 | Temp 97.6°F | Resp 18 | Ht 61.0 in | Wt 240.1 lb

## 2023-03-15 DIAGNOSIS — J449 Chronic obstructive pulmonary disease, unspecified: Secondary | ICD-10-CM | POA: Diagnosis not present

## 2023-03-15 DIAGNOSIS — E66813 Obesity, class 3: Secondary | ICD-10-CM

## 2023-03-15 DIAGNOSIS — F3160 Bipolar disorder, current episode mixed, unspecified: Secondary | ICD-10-CM | POA: Diagnosis not present

## 2023-03-15 DIAGNOSIS — R7303 Prediabetes: Secondary | ICD-10-CM | POA: Diagnosis not present

## 2023-03-15 DIAGNOSIS — M179 Osteoarthritis of knee, unspecified: Secondary | ICD-10-CM

## 2023-03-15 DIAGNOSIS — Z6841 Body Mass Index (BMI) 40.0 and over, adult: Secondary | ICD-10-CM | POA: Diagnosis not present

## 2023-03-15 MED ORDER — SEMAGLUTIDE-WEIGHT MANAGEMENT 0.25 MG/0.5ML ~~LOC~~ SOAJ: 0.2500 mg | SUBCUTANEOUS | 0 refills | Status: DC

## 2023-03-15 MED ORDER — SEMAGLUTIDE-WEIGHT MANAGEMENT 1 MG/0.5ML ~~LOC~~ SOAJ: 1.0000 mg | SUBCUTANEOUS | 0 refills | Status: AC

## 2023-03-15 MED ORDER — SEMAGLUTIDE-WEIGHT MANAGEMENT 0.5 MG/0.5ML ~~LOC~~ SOAJ: 0.5000 mg | SUBCUTANEOUS | 0 refills | Status: DC

## 2023-03-15 NOTE — Progress Notes (Signed)
Patient ID: Paree Austin, female    DOB: December 04, 1960, 63 y.o.   MRN: [id number]  PCP: Danelle Berry, PA-C  Chief Complaint  Patient presents with   Obesity    Discuss weight loss meds    Subjective:   Nicole Austin is a 63 y.o. female, presents to clinic with CC of the following:  HPI  Weight increased Making MSK pain and breathing sx worse She has mom-meals, premade portioned meals Prediabetes, new Lab Results  Component Value Date   HGBA1C 5.9 (H) 02/09/2023  She is interested in weight loss meds No history of pancreatitis or personal or family history of medullary thyroid cancers or MEN2 MSK/ortho issues limiting physical activity and exercise       Patient Active Problem List   Diagnosis Date Noted   COPD exacerbation (HCC) 02/08/2023   COVID-19 virus infection 02/08/2023   S/P total left hip arthroplasty 10/09/2022   SIADH (syndrome of inappropriate ADH production) (HCC) 07/09/2022   Iron deficiency anemia 07/07/2022   History of methicillin resistant staphylococcus aureus (MRSA) 05/03/2022   Avascular necrosis of bones of both hips (HCC) 05/03/2022   Chronic prescription benzodiazepine use 05/03/2022   Polypharmacy 04/09/2022   Sinus bradycardia 04/09/2022   Depression with anxiety 04/09/2022   MRSA (methicillin resistant staph aureus) culture positive 05/04/2021   Prolonged QT interval 01/01/2021   Bipolar disorder with depression (HCC) 02/25/2017   ADD (attention deficit disorder) 02/25/2017   Marijuana use 02/13/2017   Neuropathy, peripheral 05/07/2016   Vitamin B12 deficiency 10/24/2015   Fibromyalgia 08/30/2015   OP (osteoporosis) 06/30/2015   Sleep apnea 06/30/2015   HLD (hyperlipidemia) 05/13/2015   Obesity, Class III, BMI 40-49.9 (morbid obesity) (HCC) 08/16/2014   Low back pain with sciatica 08/04/2014   Tobacco abuse 08/04/2014   Anxiety, generalized 11/24/2013   COPD (chronic obstructive pulmonary disease) (HCC) 11/18/2013    Osteoarthritis of knee, unspecified 07/15/2013      Current Outpatient Medications:    ALPRAZolam (XANAX) 0.5 MG tablet, Take 1 tablet (0.5 mg total) by mouth 2 (two) times daily., Disp: 10 tablet, Rfl: 0   celecoxib (CELEBREX) 200 MG capsule, Take 200 mg by mouth daily., Disp: , Rfl:    diclofenac (VOLTAREN) 75 MG EC tablet, Take 75 mg by mouth 2 (two) times daily., Disp: , Rfl:    divalproex (DEPAKOTE) 250 MG DR tablet, Take 1 tablet (250 mg total) by mouth 2 (two) times daily., Disp: 60 tablet, Rfl: 2   escitalopram (LEXAPRO) 20 MG tablet, Take 20 mg by mouth daily., Disp: , Rfl:    furosemide (LASIX) 20 MG tablet, Take 0.5-1 tablets (10-20 mg total) by mouth daily as needed for fluid or edema. Take in am with potassium PRN, Disp: 5 tablet, Rfl: 0   Lurasidone HCl (LATUDA) 60 MG TABS, Take 1 tablet (60 mg total) by mouth daily with breakfast., Disp: 30 tablet, Rfl: 0   mometasone-formoterol (DULERA) 100-5 MCG/ACT AERO, Inhale 2 puffs into the lungs 2 (two) times daily., Disp: 3 each, Rfl: 0   Multiple Vitamins-Minerals (MULTIVITAMIN WITH MINERALS) tablet, Take 1 tablet by mouth daily., Disp: , Rfl:    naloxone (NARCAN) nasal spray 4 mg/0.1 mL, Spray into nostril with signs of opioid related oversedation or overdose, Disp: 1 each, Rfl: 0   nystatin (MYCOSTATIN/NYSTOP) powder, Apply 1 Application topically 3 (three) times daily. (Patient taking differently: Apply 1 Application topically 3 (three) times daily as needed (irritation).), Disp: 15 g, Rfl: 0  pregabalin (LYRICA) 25 MG capsule, Take 2 capsules (50 mg total) by mouth 2 (two) times daily for 20 days., Disp: 80 capsule, Rfl: 0   rOPINIRole (REQUIP XL) 4 MG 24 hr tablet, Take 4 mg by mouth at bedtime., Disp: , Rfl:    Semaglutide-Weight Management 0.25 MG/0.5ML SOAJ, Inject 0.25 mg into the skin once a week for 28 days., Disp: 2 mL, Rfl: 0   [START ON 04/13/2023] Semaglutide-Weight Management 0.5 MG/0.5ML SOAJ, Inject 0.5 mg into the skin  once a week for 28 days., Disp: 2 mL, Rfl: 0   [START ON 05/12/2023] Semaglutide-Weight Management 1 MG/0.5ML SOAJ, Inject 1 mg into the skin once a week for 28 days., Disp: 2 mL, Rfl: 0   tiZANidine (ZANAFLEX) 2 MG tablet, Take 2 mg by mouth every 8 (eight) hours as needed., Disp: , Rfl:    umeclidinium bromide (INCRUSE ELLIPTA) 62.5 MCG/ACT AEPB, Inhale 1 puff into the lungs daily., Disp: 90 each, Rfl: 0   Allergies  Allergen Reactions   Chantix [Varenicline] Nausea Only   Glycopyrrolate Rash    Oral irritation     Social History   Tobacco Use   Smoking status: Former    Current packs/day: 0.00    Average packs/day: 0.5 packs/day for 36.0 years (18.0 ttl pk-yrs)    Types: Cigarettes    Start date: 03/13/1986    Quit date: 03/13/2022    Years since quitting: 1.0    Passive exposure: Past   Smokeless tobacco: Never   Tobacco comments:    Pt using nicotine patches; 1/2 PPD as of 03/21/21  Vaping Use   Vaping status: Some Days   Substances: Nicotine, Flavoring  Substance Use Topics   Alcohol use: No    Alcohol/week: 0.0 standard drinks of alcohol   Drug use: Yes    Types: Marijuana    Comment: occasionally      Chart Review Today: I personally reviewed active problem list, medication list, allergies, family history, social history, health maintenance, notes from last encounter, lab results, imaging with the patient/caregiver today.   Review of Systems  Constitutional: Negative.   HENT: Negative.    Eyes: Negative.   Respiratory: Negative.    Cardiovascular: Negative.   Gastrointestinal: Negative.   Endocrine: Negative.   Genitourinary: Negative.   Musculoskeletal: Negative.   Skin: Negative.   Allergic/Immunologic: Negative.   Neurological: Negative.   Hematological: Negative.   Psychiatric/Behavioral: Negative.    All other systems reviewed and are negative.      Objective:   Vitals:   03/15/23 1346  BP: 122/86  Pulse: 98  Resp: 18  Temp: 97.6 F (36.4  C)  SpO2: 94%  Weight: 240 lb 1.6 oz (108.9 kg)  Height: 5\' 1"  (1.549 m)    Body mass index is 45.37 kg/m.  Physical Exam Vitals and nursing note reviewed.  Constitutional:      General: She is not in acute distress.    Appearance: Normal appearance. She is well-developed. She is obese. She is not ill-appearing, toxic-appearing or diaphoretic.  HENT:     Head: Normocephalic and atraumatic.     Nose: Nose normal.  Eyes:     General:        Right eye: No discharge.        Left eye: No discharge.     Conjunctiva/sclera: Conjunctivae normal.  Neck:     Trachea: No tracheal deviation.  Cardiovascular:     Rate and Rhythm: Normal rate and regular rhythm.  Pulmonary:     Effort: Pulmonary effort is normal. No respiratory distress.     Breath sounds: No stridor.  Musculoskeletal:        General: Normal range of motion.  Skin:    General: Skin is warm and dry.     Findings: No rash.  Neurological:     Mental Status: She is alert.     Motor: No abnormal muscle tone.     Coordination: Coordination normal.     Gait: Gait abnormal.  Psychiatric:        Behavior: Behavior normal.      Lab Results  Component Value Date   HGBA1C 5.9 (H) 02/09/2023   HGBA1C 4.1 04/14/2022   HGBA1C 5.8 (H) 01/03/2021       Assessment & Plan:      ICD-10-CM   1. Class 3 severe obesity with serious comorbidity and body mass index (BMI) of 45.0 to 49.9 in adult, unspecified obesity type (HCC)  M57.846 Semaglutide-Weight Management 0.25 MG/0.5ML SOAJ   E66.01 Semaglutide-Weight Management 0.5 MG/0.5ML SOAJ   Z68.42 Semaglutide-Weight Management 1 MG/0.5ML SOAJ   with associated comorbidities OA, COPD, prediabetes, MDD/bipolar, diet/lifestyle efforts reviewed/educated pt No contraindications to GLP-1 including no MEN type II no medullary thyroid carcinoma history no pancreatitis Discussed with patient at length the dosing increments, possible side effects and I have advised her that we should do  follow-up in about 2 months to review her weights and med tolerance     2. Osteoarthritis of knee, unspecified  M17.9    seeing ortho, back, hip, knee pain limiting activity, has been advised to loose weight    3. Chronic obstructive pulmonary disease, unspecified COPD type (HCC)  J44.9     4. Bipolar affective disorder, current episode mixed, current episode severity unspecified (HCC) Chronic F31.60    managed by psychiatry with multipe medications, several high risk meds - latuda, xanax, lexapro, depakote    5. Prediabetes  R73.03    12/14 A1C 5.9, recheck in 3 months, diet/lifestyle efforts and benefits with weight loss GLP-1 meds reviewed     Return for 2 month (05/10/2023) OV for weight med check, and she needs AWV w/ nurse.       Danelle Berry, PA-C 03/15/23 3:49 PM

## 2023-03-15 NOTE — Telephone Encounter (Unsigned)
Copied from CRM [id number]. Topic: General - Other >> Mar 15, 2023  3:54 PM Phill Myron wrote: Reason for UKG:[id number] Management 0.25 MG/0.5ML SOAJ, pt Nicole Austin called her pharmacy and  they stated authorization is needed for the medication.

## 2023-03-15 NOTE — Telephone Encounter (Signed)
Per Sheliah Mends request pt is needing a AWV appt. Please contact patient.

## 2023-03-16 DIAGNOSIS — M87059 Idiopathic aseptic necrosis of unspecified femur: Secondary | ICD-10-CM | POA: Diagnosis not present

## 2023-03-18 NOTE — Telephone Encounter (Signed)
PA denied and will require an e-appeal request from patient if she'd like. Called w/no answer. VM full- unable to leave message for patient.

## 2023-03-18 NOTE — Telephone Encounter (Signed)
PA submitted.

## 2023-03-18 NOTE — Telephone Encounter (Signed)
Pt calling stating prior authorization is needed for Semaglutide-Weight Management 0.25 MG/0.5ML SOAJ

## 2023-03-19 ENCOUNTER — Inpatient Hospital Stay: Payer: 59 | Admitting: Physician Assistant

## 2023-03-19 ENCOUNTER — Other Ambulatory Visit: Payer: Self-pay | Admitting: Physician Assistant

## 2023-03-19 NOTE — Telephone Encounter (Signed)
Requested medication (s) are due for refill today: yes  Requested medication (s) are on the active medication list: yes  Last refill:  03/01/23  Future visit scheduled: yes  Notes to clinic:  Unable to refill per protocol, cannot delegate.      Requested Prescriptions  Pending Prescriptions Disp Refills   pregabalin (LYRICA) 25 MG capsule [Pharmacy Med Name: PREGABALIN 25MG  CAPSULE] 80 capsule 0    Sig: TAKE TWO CAPSULES (50 MG TOTAL) BY MOUTH TWO TIMES DAILY FOR 20 DAYS.     Not Delegated - Neurology:  Anticonvulsants - Controlled - pregabalin Failed - 03/19/2023  2:39 PM      Failed - This refill cannot be delegated      Passed - Cr in normal range and within 360 days    Creat  Date Value Ref Range Status  07/31/2022 1.05 0.50 - 1.05 mg/dL Final   Creatinine, Ser  Date Value Ref Range Status  02/11/2023 0.90 0.44 - 1.00 mg/dL Final   Creatinine, Urine  Date Value Ref Range Status  10/04/2015 CANCELED 20 - 320 mg/dL     Comment:    Test not performed, no urine was received.    Result canceled by the ancillary   10/04/2015 128 20 - 320 mg/dL Final         Passed - Completed PHQ-2 or PHQ-9 in the last 360 days      Passed - Valid encounter within last 12 months    Recent Outpatient Visits           4 days ago Class 3 severe obesity with serious comorbidity and body mass index (BMI) of 45.0 to 49.9 in adult, unspecified obesity type [facility])   [place] Danelle Berry, PA-C   1 month ago Chronic obstructive pulmonary disease with acute exacerbation [facility])   [facility] Mecum, Oswaldo Conroy, PA-C   4 months ago Tremor   [facility] Danelle Berry, PA-C   6 months ago Unspecified abnormalities of gait and mobility   [facility] Danelle Berry, PA-C   7 months ago Encounter for examination following treatment at hospital   [facility] Danelle Berry,  [place]       Future Appointments             In 3 days Danelle Berry, PA-C [facility], [facility]   In 1 month Danelle Berry, PA-C [facility], [facility]

## 2023-03-21 ENCOUNTER — Ambulatory Visit: Payer: 59

## 2023-03-21 DIAGNOSIS — Z1211 Encounter for screening for malignant neoplasm of colon: Secondary | ICD-10-CM

## 2023-03-21 DIAGNOSIS — Z0000 Encounter for general adult medical examination without abnormal findings: Secondary | ICD-10-CM

## 2023-03-21 NOTE — Patient Instructions (Addendum)
Nicole Austin , Thank you for taking time to come for your Medicare Wellness Visit. I appreciate your ongoing commitment to your health goals. Please review the following plan we discussed and let me know if I can assist you in the future.   Referrals/Orders/Follow-Ups/Clinician Recommendations: REFERRAL FOR COLONOSCOPY SENT  This is a list of the screening recommended for you and due dates:  Health Maintenance  Topic Date Due   Mammogram  Never done   Colon Cancer Screening  08/08/2018   Pap with HPV screening  05/02/2023   COVID-19 Vaccine (4 - 2024-25 season) 03/31/2023*   Medicare Annual Wellness Visit  03/20/2024   DTaP/Tdap/Td vaccine (2 - Td or Tdap) 12/12/2025   Pneumococcal Vaccination  Completed   Flu Shot  Completed   Hepatitis C Screening  Completed   HIV Screening  Completed   Zoster (Shingles) Vaccine  Completed   HPV Vaccine  Aged Out  *Topic was postponed. The date shown is not the original due date.    Advanced directives: (ACP Link)Information on Advanced Care Planning can be found at [facility] [place] Advance Health Care Directives Advance Health Care Directives ([email or web address]   Next Medicare Annual Wellness Visit scheduled for next year: Yes   03/26/24 @ 1:10 PM BY PHONE

## 2023-03-21 NOTE — Progress Notes (Signed)
Subjective:   Nicole Austin is a 63 y.o. female who presents for Medicare Annual (Subsequent) preventive examination.  Visit Complete: Virtual I connected with  Attalie Sanguinetti Reichard on 03/21/23 by a audio enabled telemedicine application and verified that I am speaking with the correct person using two identifiers.   This patient declined Interactive audio and Acupuncturist. Therefore the visit was completed with audio only.  Patient Location: Home  Provider Location: Office/Clinic  I discussed the limitations of evaluation and management by telemedicine. The patient expressed understanding and agreed to proceed.  Vital Signs: Because this visit was a virtual/telehealth visit, some criteria may be missing or patient reported. Any vitals not documented were not able to be obtained and vitals that have been documented are patient reported.  Cardiac Risk Factors include: advanced age (>65men, >26 women);dyslipidemia;sedentary lifestyle;obesity (BMI >30kg/m2)     Objective:    There were no vitals filed for this visit. There is no height or weight on file to calculate BMI.     03/21/2023   11:03 AM 02/08/2023   10:59 AM 02/06/2023   10:46 AM 01/26/2023    1:36 PM 12/18/2022    1:37 PM 10/09/2022    9:31 AM 09/17/2022    9:32 AM  Advanced Directives  Does Patient Have a Medical Advance Directive? No No No No No No No  Would patient like information on creating a medical advance directive? No - Patient declined  No - Patient declined  No - Patient declined No - Patient declined     Current Medications (verified) Outpatient Encounter Medications as of 03/21/2023  Medication Sig   ALPRAZolam (XANAX) 0.5 MG tablet Take 1 tablet (0.5 mg total) by mouth 2 (two) times daily.   celecoxib (CELEBREX) 200 MG capsule Take 200 mg by mouth daily.   divalproex (DEPAKOTE) 250 MG DR tablet Take 1 tablet (250 mg total) by mouth 2 (two) times daily.   escitalopram (LEXAPRO) 20 MG  tablet Take 20 mg by mouth daily.   Lurasidone HCl (LATUDA) 60 MG TABS Take 1 tablet (60 mg total) by mouth daily with breakfast.   mometasone-formoterol (DULERA) 100-5 MCG/ACT AERO Inhale 2 puffs into the lungs 2 (two) times daily.   Multiple Vitamins-Minerals (MULTIVITAMIN WITH MINERALS) tablet Take 1 tablet by mouth daily.   naloxone (NARCAN) nasal spray 4 mg/0.1 mL Spray into nostril with signs of opioid related oversedation or overdose   nystatin (MYCOSTATIN/NYSTOP) powder Apply 1 Application topically 3 (three) times daily. (Patient taking differently: Apply 1 Application topically 3 (three) times daily as needed (irritation).)   pregabalin (LYRICA) 50 MG capsule Take 1 capsule (50 mg total) by mouth 2 (two) times daily.   rOPINIRole (REQUIP XL) 4 MG 24 hr tablet Take 4 mg by mouth at bedtime.   tiZANidine (ZANAFLEX) 2 MG tablet Take 2 mg by mouth every 8 (eight) hours as needed.   umeclidinium bromide (INCRUSE ELLIPTA) 62.5 MCG/ACT AEPB Inhale 1 puff into the lungs daily.   diclofenac (VOLTAREN) 75 MG EC tablet Take 75 mg by mouth 2 (two) times daily. (Patient not taking: Reported on 03/21/2023)   furosemide (LASIX) 20 MG tablet Take 0.5-1 tablets (10-20 mg total) by mouth daily as needed for fluid or edema. Take in am with potassium PRN (Patient not taking: Reported on 03/21/2023)   Semaglutide-Weight Management 0.25 MG/0.5ML SOAJ Inject 0.25 mg into the skin once a week for 28 days. (Patient not taking: Reported on 03/21/2023)   [START ON 04/13/2023]  Semaglutide-Weight Management 0.5 MG/0.5ML SOAJ Inject 0.5 mg into the skin once a week for 28 days. (Patient not taking: Reported on 03/21/2023)   [START ON 05/12/2023] Semaglutide-Weight Management 1 MG/0.5ML SOAJ Inject 1 mg into the skin once a week for 28 days. (Patient not taking: Reported on 03/21/2023)   No facility-administered encounter medications on file as of 03/21/2023.    Allergies (verified) Chantix [varenicline] and Glycopyrrolate    History: Past Medical History:  Diagnosis Date   ADHD (attention deficit hyperactivity disorder)    Apnea, sleep 06/30/2015   Arthritis    Asthma    Asthma with acute exacerbation 06/30/2015   Benign neoplasm of ascending colon    Bipolar 1 disorder (HCC)    Chronic pain    COPD, moderate (HCC) 11/18/2013   Depression    DOE (dyspnea on exertion)    Fall 07/04/2022   GERD (gastroesophageal reflux disease) 08/30/2015   Gout 08/16/2014   History of acute myocardial infarction 06/30/2015   History of panic attacks    HPV (human papilloma virus) infection 07/17/2013   Hyperlipidemia    Left knee pain 07/06/2022   Low HDL (under 40) 08/30/2015   MI (myocardial infarction) (HCC) 2018   MRSA infection 2023   groin abscess   Nose colonized with MRSA 03/21/2022   a.) PCR (+) prior to LEFT THA   Osteoporosis    Parkinson disease (HCC)    Polyp of sigmoid colon    Pre-diabetes    Prediabetes 05/20/2016   PTSD (post-traumatic stress disorder)    Restless leg syndrome    Rhabdomyolysis    Sleep apnea 06/30/2015   Stage 3 severe COPD by GOLD classification (HCC) 11/18/2013   Stroke (HCC) 2018   right arm weakness   Traumatic rhabdomyolysis (HCC) 07/04/2022   Tremors of nervous system    hands   Uncomplicated asthma 06/30/2015   Varicella 02/25/2017   Past Surgical History:  Procedure Laterality Date   BACK SURGERY     lumbar   CATARACT EXTRACTION W/PHACO Right 01/01/2019   Procedure: CATARACT EXTRACTION PHACO AND INTRAOCULAR LENS PLACEMENT (IOC) right vision blue;  Surgeon: Elliot Cousin, MD;  Location: [facility] ORS;  Service: Ophthalmology;  Laterality: Right;  [place] 00:39.4 CDE 5.49 Fluid Pack lot # [id number]   CATARACT EXTRACTION W/PHACO Left 01/29/2019   Procedure: CATARACT EXTRACTION PHACO AND INTRAOCULAR LENS PLACEMENT (IOC) LEFT Vision Blue;  Surgeon: Elliot Cousin, MD;  Location: [facility] ORS;  Service: Ophthalmology;  Laterality: Left;  [place] 00:51.1 CDE 4.38 Fluid Pack Lot #  [id number]   COLONOSCOPY WITH PROPOFOL N/A 08/07/2017   Procedure: COLONOSCOPY WITH PROPOFOL;  Surgeon: Pasty Spillers, MD;  Location: [facility] ENDOSCOPY;  Service: Endoscopy;  Laterality: N/A;   HAND SURGERY Left    fractured with pins   REPLACEMENT TOTAL KNEE Left 2016   SPINAL CORD STIMULATOR INSERTION  2014   SPINAL CORD STIMULATOR REMOVAL  2014   TOTAL HIP ARTHROPLASTY Left 10/09/2022   Procedure: TOTAL HIP ARTHROPLASTY ANTERIOR APPROACH;  Surgeon: Durene Romans, MD;  Location: [facility];  Service: Orthopedics;  Laterality: Left;   TUBAL LIGATION     Family History  Problem Relation Age of Onset   Hernia Mother    Heart disease Mother    OCD Mother    Diabetes Mother    Parkinson's disease Father    Bipolar disorder Sister    Schizophrenia Sister    ADD / ADHD Sister    Alcohol abuse Brother    Bipolar  disorder Sister    Paranoid behavior Sister    ADD / ADHD Sister    ADD / ADHD Son    Dementia Maternal Grandmother    Emphysema Maternal Grandfather    ADD / ADHD Son    ADD / ADHD Son    Depression Son    Social History   Socioeconomic History   Marital status: Single    Spouse name: Not on file   Number of children: 3   Years of education: Not on file   Highest education level: 10th grade  Occupational History   Occupation: Disability  Tobacco Use   Smoking status: Former    Current packs/day: 0.00    Average packs/day: 0.5 packs/day for 36.0 years (18.0 ttl pk-yrs)    Types: Cigarettes    Start date: 03/13/1986    Quit date: 03/13/2022    Years since quitting: 1.0    Passive exposure: Past   Smokeless tobacco: Never   Tobacco comments:    Pt using nicotine patches; 1/2 PPD as of 03/21/21  Vaping Use   Vaping status: Some Days   Substances: Nicotine, Flavoring  Substance and Sexual Activity   Alcohol use: No    Alcohol/week: 0.0 standard drinks of alcohol   Drug use: Yes    Types: Marijuana    Comment: occasionally   Sexual activity: Not Currently   Other Topics Concern   Not on file  Social History Narrative   Pt lives alone   Social Drivers of Health   Financial Resource Strain: Low Risk  (03/21/2023)   Overall Financial Resource Strain (CARDIA)    Difficulty of Paying Living Expenses: Not very hard  Food Insecurity: No Food Insecurity (03/21/2023)   Hunger Vital Sign    Worried About Running Out of Food in the Last Year: Never true    Ran Out of Food in the Last Year: Never true  Transportation Needs: No Transportation Needs (03/21/2023)   PRAPARE - Administrator, Civil Service (Medical): No    Lack of Transportation (Non-Medical): No  Physical Activity: Insufficiently Active (03/21/2023)   Exercise Vital Sign    Days of Exercise per Week: 2 days    Minutes of Exercise per Session: 20 min  Stress: Stress Concern Present (03/21/2023)   [employer] of Occupational Health - Occupational Stress Questionnaire    Feeling of Stress : To some extent  Social Connections: Moderately Isolated (03/21/2023)   Social Connection and Isolation Panel [NHANES]    Frequency of Communication with Friends and Family: More than three times a week    Frequency of Social Gatherings with Friends and Family: More than three times a week    Attends Religious Services: More than 4 times per year    Active Member of [employer] or Organizations: No    Attends Banker Meetings: Never    Marital Status: Divorced    Tobacco Counseling Counseling given: Not Answered Tobacco comments: Pt using nicotine patches; 1/2 PPD as of 03/21/21   Clinical Intake:  Pre-visit preparation completed: Yes  Pain : No/denies pain     BMI - recorded: 45.3 Nutritional Status: BMI > 30  Obese Nutritional Risks: None Diabetes: No  How often do you need to have someone help you when you read instructions, pamphlets, or other written materials from your doctor or pharmacy?: 1 - Never  Interpreter Needed?: No  Information entered by ::  Kennedy Bucker, LPN   Activities of Daily Living  03/21/2023   11:04 AM 02/09/2023    5:00 PM  In your present state of health, do you have any difficulty performing the following activities:  Hearing? 0 0  Vision? 0 0  Difficulty concentrating or making decisions? 0 0  Walking or climbing stairs? 1   Dressing or bathing? 1   Doing errands, shopping? 1 0  Preparing Food and eating ? N   Using the Toilet? N   In the past six months, have you accidently leaked urine? N   Do you have problems with loss of bowel control? N   Managing your Medications? N   Managing your Finances? N   Housekeeping or managing your Housekeeping? Y     Patient Care Team: Danelle Berry, PA-C as PCP - General (Family Medicine) Lynett Fish, MD as Referring Physician (Psychiatry) Arletha Grippe Lise Auer, LCSW as Social Worker (Professional Counselor) Lyndle Herrlich, MD as Consulting Physician (Orthopedic Surgery) Salena Saner, MD as Consulting Physician (Pulmonary Disease) Lonell Face, MD as Consulting Physician (Neurology) Rodney Langton, RN as [facility] (Optometry)  Indicate any recent Medical Services you may have received from other than [facility] providers in the past year (date may be approximate).     Assessment:   This is a routine wellness examination for Danyiel.  Hearing/Vision screen Hearing Screening - Comments:: NO AIDS Vision Screening - Comments:: WEARS GLASSES ALL THE TIME- [facility]   Goals Addressed             This Visit's Progress    Cut out extra servings         Depression Screen    03/21/2023   11:01 AM 02/01/2023    2:14 PM 10/30/2022    1:18 PM 09/10/2022    2:47 PM 07/31/2022    2:31 PM 07/09/2022   10:32 AM 06/14/2022   10:32 AM  PHQ 2/9 Scores  PHQ - 2 Score 4 3 2  0 0 6 6  PHQ- 9 Score 6 7 6  0 0 10 12    Fall Risk    03/21/2023   11:03 AM 02/01/2023    2:14 PM 10/30/2022    1:17 PM 09/10/2022     2:47 PM 07/31/2022    2:30 PM  Fall Risk   Falls in the past year? 1 0 1 1 1   Number falls in past yr: 1 0 1 1 1   Injury with Fall? 1 0 1 1 1   Risk for fall due to : History of fall(s);Impaired mobility No Fall Risks Impaired balance/gait Impaired balance/gait Impaired balance/gait  Follow up Falls evaluation completed;Falls prevention discussed Falls prevention discussed Falls prevention discussed;Education provided;Falls evaluation completed Falls prevention discussed;Education provided;Falls evaluation completed Falls prevention discussed;Education provided;Falls evaluation completed    MEDICARE RISK AT HOME: Medicare Risk at Home Any stairs in or around the home?: Yes If so, are there any without handrails?: Yes Home free of loose throw rugs in walkways, pet beds, electrical cords, etc?: Yes Adequate lighting in your home to reduce risk of falls?: Yes Life alert?: No Use of a cane, walker or w/c?: Yes (CANE OCCASIONALLY) Grab bars in the bathroom?: No Shower chair or bench in shower?: Yes Elevated toilet seat or a handicapped toilet?: No  TIMED UP AND GO:  Was the test performed?  No    Cognitive Function:        03/21/2023   11:06 AM 03/21/2022   11:34 AM 03/17/2020  2:35 PM 03/17/2019    2:36 PM 02/27/2018    3:36 PM  6CIT Screen  What Year? 0 points 0 points 0 points 0 points 0 points  What month? 0 points 0 points 0 points 0 points 0 points  What time? 0 points 3 points 0 points 0 points 0 points  Count back from 20 0 points 0 points 0 points 0 points 0 points  Months in reverse 0 points 4 points 0 points 4 points 0 points  Repeat phrase 2 points 0 points 2 points 0 points 4 points  Total Score 2 points 7 points 2 points 4 points 4 points    Immunizations Immunization History  Administered Date(s) Administered   Influenza Split 12/15/2012   Influenza, Seasonal, Injecte, Preservative Fre 02/10/2023   Influenza,inj,Quad PF,6+ Mos 10/27/2013, 12/22/2014, 12/13/2015,  10/16/2016, 05/02/2018, 12/20/2020, 11/13/2021   Influenza-Unspecified 11/07/2011, 12/15/2012   Moderna Sars-Covid-2 Vaccination 06/03/2019, 07/15/2019, 03/08/2020   PNEUMOCOCCAL CONJUGATE-20 02/10/2023   Pneumococcal Polysaccharide-23 06/21/2015   Tdap 12/13/2015   Zoster Recombinant(Shingrix) 07/25/2018, 10/08/2018    TDAP status: Up to date  Flu Vaccine status: Up to date  Pneumococcal vaccine status: Up to date  Covid-19 vaccine status: Completed vaccines  Qualifies for Shingles Vaccine? Yes   Zostavax completed No   Shingrix Completed?: Yes  Screening Tests Health Maintenance  Topic Date Due   MAMMOGRAM  Never done   Colonoscopy  08/08/2018   Cervical Cancer Screening (HPV/Pap Cotest)  05/02/2023   COVID-19 Vaccine (4 - 2024-25 season) 03/31/2023 (Originally 10/28/2022)   Medicare Annual Wellness (AWV)  03/20/2024   DTaP/Tdap/Td (2 - Td or Tdap) 12/12/2025   Pneumococcal Vaccine 81-36 Years old  Completed   INFLUENZA VACCINE  Completed   Hepatitis C Screening  Completed   HIV Screening  Completed   Zoster Vaccines- Shingrix  Completed   HPV VACCINES  Aged Out    Health Maintenance  Health Maintenance Due  Topic Date Due   MAMMOGRAM  Never done   Colonoscopy  08/08/2018   Cervical Cancer Screening (HPV/Pap Cotest)  05/02/2023    Colorectal cancer screening: Referral to GI placed 03/21/23. Pt aware the office will call re: appt.  Mammogram status: Ordered 05/22/22. Pt provided with contact info and advised to call to schedule appt.    Lung Cancer Screening: (Low Dose CT Chest recommended if Age 39-80 years, 20 pack-year currently smoking OR have quit w/in 15years.) does not qualify.    Additional Screening:  Hepatitis C Screening: does qualify; Completed 10/16/16  Vision Screening: Recommended annual ophthalmology exams for early detection of glaucoma and other disorders of the eye. Is the patient up to date with their annual eye exam?  Yes  Who is the  provider or what is the name of the office in which the patient attends annual eye exams? [facility] If pt is not established with a provider, would they like to be referred to a provider to establish care? No .   Dental Screening: Recommended annual dental exams for proper oral hygiene   Community Resource Referral / Chronic Care Management: CRR required this visit?  No   CCM required this visit?  No     Plan:     I have personally reviewed and noted the following in the patient's chart:   Medical and social history Use of alcohol, tobacco or illicit drugs  Current medications and supplements including opioid prescriptions. Patient is not currently taking opioid prescriptions. Functional ability and status Nutritional status Physical  activity Advanced directives List of other physicians Hospitalizations, surgeries, and ER visits in previous 12 months Vitals Screenings to include cognitive, depression, and falls Referrals and appointments  In addition, I have reviewed and discussed with patient certain preventive protocols, quality metrics, and best practice recommendations. A written personalized care plan for preventive services as well as general preventive health recommendations were provided to patient.     Hal Hope, LPN   5/62/1308   After Visit Summary: (MyChart) Due to this being a telephonic visit, the after visit summary with patients personalized plan was offered to patient via MyChart   Nurse Notes: REFERRAL FOR COLONOSCOPY

## 2023-03-22 ENCOUNTER — Other Ambulatory Visit: Payer: Self-pay

## 2023-03-22 ENCOUNTER — Ambulatory Visit: Payer: 59 | Admitting: Family Medicine

## 2023-03-22 ENCOUNTER — Telehealth: Payer: Self-pay

## 2023-03-22 DIAGNOSIS — Z860100 Personal history of colon polyps, unspecified: Secondary | ICD-10-CM

## 2023-03-22 MED ORDER — NA SULFATE-K SULFATE-MG SULF 17.5-3.13-1.6 GM/177ML PO SOLN: 1.0000 | Freq: Once | ORAL | 0 refills | Status: AC

## 2023-03-22 NOTE — Telephone Encounter (Signed)
Gastroenterology Pre-Procedure Review  Request Date: 04/10/23 Requesting Physician: Dr. Tobi Bastos  PATIENT REVIEW QUESTIONS: The patient responded to the following health history questions as indicated:    1. Are you having any GI issues? yes (stomach ache) 2. Do you have a personal history of Polyps? Yes last colonoscopy 08/07/2017 with Dr. Maximino Greenland 3 mm polyp noted ascending colon recommended repeat with 2 day prep.  Instructed Miralax Gatorade on 02/10 and Suprep 02/11 3. Do you have a family history of Colon Cancer or Polyps? no 4. Diabetes Mellitus? no 5. Joint replacements in the past 12 months?no 6. Major health problems in the past 3 months?no 7. Any artificial heart valves, MVP, or defibrillator?no    MEDICATIONS & ALLERGIES:    Patient reports the following regarding taking any anticoagulation/antiplatelet therapy:   Plavix, Coumadin, Eliquis, Xarelto, Lovenox, Pradaxa, Brilinta, or Effient? no Aspirin? no  Patient confirms/reports the following medications:  Current Outpatient Medications  Medication Sig Dispense Refill   ALPRAZolam (XANAX) 0.5 MG tablet Take 1 tablet (0.5 mg total) by mouth 2 (two) times daily. 10 tablet 0   celecoxib (CELEBREX) 200 MG capsule Take 200 mg by mouth daily.     diclofenac (VOLTAREN) 75 MG EC tablet Take 75 mg by mouth 2 (two) times daily. (Patient not taking: Reported on 03/21/2023)     divalproex (DEPAKOTE) 250 MG DR tablet Take 1 tablet (250 mg total) by mouth 2 (two) times daily. 60 tablet 2   escitalopram (LEXAPRO) 20 MG tablet Take 20 mg by mouth daily.     furosemide (LASIX) 20 MG tablet Take 0.5-1 tablets (10-20 mg total) by mouth daily as needed for fluid or edema. Take in am with potassium PRN (Patient not taking: Reported on 03/21/2023) 5 tablet 0   Lurasidone HCl (LATUDA) 60 MG TABS Take 1 tablet (60 mg total) by mouth daily with breakfast. 30 tablet 0   mometasone-formoterol (DULERA) 100-5 MCG/ACT AERO Inhale 2 puffs into the lungs 2  (two) times daily. 3 each 0   Multiple Vitamins-Minerals (MULTIVITAMIN WITH MINERALS) tablet Take 1 tablet by mouth daily.     naloxone (NARCAN) nasal spray 4 mg/0.1 mL Spray into nostril with signs of opioid related oversedation or overdose 1 each 0   nystatin (MYCOSTATIN/NYSTOP) powder Apply 1 Application topically 3 (three) times daily. (Patient taking differently: Apply 1 Application topically 3 (three) times daily as needed (irritation).) 15 g 0   pregabalin (LYRICA) 50 MG capsule Take 1 capsule (50 mg total) by mouth 2 (two) times daily. 180 capsule 0   rOPINIRole (REQUIP XL) 4 MG 24 hr tablet Take 4 mg by mouth at bedtime.     Semaglutide-Weight Management 0.25 MG/0.5ML SOAJ Inject 0.25 mg into the skin once a week for 28 days. (Patient not taking: Reported on 03/21/2023) 2 mL 0   [START ON 04/13/2023] Semaglutide-Weight Management 0.5 MG/0.5ML SOAJ Inject 0.5 mg into the skin once a week for 28 days. (Patient not taking: Reported on 03/21/2023) 2 mL 0   [START ON 05/12/2023] Semaglutide-Weight Management 1 MG/0.5ML SOAJ Inject 1 mg into the skin once a week for 28 days. (Patient not taking: Reported on 03/21/2023) 2 mL 0   tiZANidine (ZANAFLEX) 2 MG tablet Take 2 mg by mouth every 8 (eight) hours as needed.     umeclidinium bromide (INCRUSE ELLIPTA) 62.5 MCG/ACT AEPB Inhale 1 puff into the lungs daily. 90 each 0   No current facility-administered medications for this visit.    Patient confirms/reports the following  allergies:  Allergies  Allergen Reactions   Chantix [Varenicline] Nausea Only   Glycopyrrolate Rash    Oral irritation    No orders of the defined types were placed in this encounter.   AUTHORIZATION INFORMATION Primary Insurance: 1D#: Group #:  Secondary Insurance: 1D#: Group #:  SCHEDULE INFORMATION: Date: 04/10/23 Time: Location: [facility]

## 2023-03-27 ENCOUNTER — Ambulatory Visit (INDEPENDENT_AMBULATORY_CARE_PROVIDER_SITE_OTHER): Payer: 59 | Admitting: Family Medicine

## 2023-03-27 ENCOUNTER — Encounter: Payer: Self-pay | Admitting: Family Medicine

## 2023-03-27 VITALS — BP 122/74 | HR 91 | Resp 16 | Ht 61.0 in | Wt 240.0 lb

## 2023-03-27 DIAGNOSIS — R32 Unspecified urinary incontinence: Secondary | ICD-10-CM | POA: Diagnosis not present

## 2023-03-27 DIAGNOSIS — E66813 Obesity, class 3: Secondary | ICD-10-CM | POA: Diagnosis not present

## 2023-03-27 DIAGNOSIS — Z7689 Persons encountering health services in other specified circumstances: Secondary | ICD-10-CM | POA: Diagnosis not present

## 2023-03-27 DIAGNOSIS — Z6841 Body Mass Index (BMI) 40.0 and over, adult: Secondary | ICD-10-CM

## 2023-03-27 MED ORDER — SEMAGLUTIDE-WEIGHT MANAGEMENT 0.25 MG/0.5ML ~~LOC~~ SOAJ: 0.2500 mg | SUBCUTANEOUS | 0 refills | Status: AC

## 2023-03-27 NOTE — Progress Notes (Signed)
Patient ID: Nicole Austin, female    DOB: [birth year], 63 y.o.   MRN: [id number]  PCP: Danelle Berry, PA-C  Chief Complaint  Patient presents with   Weight Loss    Would like something for weight loss. Insurance denied [employer].   Bladder Accidents    Urinating ownself, x1 yr. Getting worse    Subjective:   Nicole Austin is a 63 y.o. female, presents to clinic with CC of the following:  HPI  Urinary incontinence for 1 year no eval  Sometimes not feeling the urge, a lot of times cannot make it to the bathroom  No dysuria Denies saddle anesthesia  She does have difficulty with ambulating particularly still having trouble w LLE, but not LE weakness, numbness She does have low back pain, there is a recent Lumbar MRI last year  Weight/obesity - denies PA on wegovy - letter states not covered with her medicare D plan      Patient Active Problem List   Diagnosis Date Noted   Bipolar affective disorder, current episode mixed (HCC) 03/15/2023   COPD exacerbation (HCC) 02/08/2023   COVID-19 virus infection 02/08/2023   S/P total left hip arthroplasty 10/09/2022   SIADH (syndrome of inappropriate ADH production) (HCC) 07/09/2022   Iron deficiency anemia 07/07/2022   History of methicillin resistant staphylococcus aureus (MRSA) 05/03/2022   Avascular necrosis of bones of both hips (HCC) 05/03/2022   Chronic prescription benzodiazepine use 05/03/2022   Polypharmacy 04/09/2022   Sinus bradycardia 04/09/2022   Depression with anxiety 04/09/2022   MRSA (methicillin resistant staph aureus) culture positive 05/04/2021   Prolonged QT interval 01/01/2021   Bipolar disorder with depression (HCC) 02/25/2017   ADD (attention deficit disorder) 02/25/2017   Marijuana use 02/13/2017   Prediabetes 05/20/2016   Neuropathy, peripheral 05/07/2016   Vitamin B12 deficiency 10/24/2015   Fibromyalgia 08/30/2015   OP (osteoporosis) 06/30/2015   Sleep apnea 06/30/2015   HLD  (hyperlipidemia) 05/13/2015   Class 3 severe obesity with serious comorbidity and body mass index (BMI) of 45.0 to 49.9 in adult (HCC) 08/16/2014   Obesity, Class III, BMI 40-49.9 (morbid obesity) (HCC) 08/16/2014   Low back pain with sciatica 08/04/2014   Tobacco abuse 08/04/2014   Anxiety, generalized 11/24/2013   COPD (chronic obstructive pulmonary disease) (HCC) 11/18/2013   Osteoarthritis of knee, unspecified 07/15/2013      Current Outpatient Medications:    ALPRAZolam (XANAX) 0.5 MG tablet, Take 1 tablet (0.5 mg total) by mouth 2 (two) times daily., Disp: 10 tablet, Rfl: 0   celecoxib (CELEBREX) 200 MG capsule, Take 200 mg by mouth daily., Disp: , Rfl:    divalproex (DEPAKOTE) 250 MG DR tablet, Take 1 tablet (250 mg total) by mouth 2 (two) times daily., Disp: 60 tablet, Rfl: 2   escitalopram (LEXAPRO) 20 MG tablet, Take 20 mg by mouth daily., Disp: , Rfl:    Lurasidone HCl (LATUDA) 60 MG TABS, Take 1 tablet (60 mg total) by mouth daily with breakfast., Disp: 30 tablet, Rfl: 0   mometasone-formoterol (DULERA) 100-5 MCG/ACT AERO, Inhale 2 puffs into the lungs 2 (two) times daily., Disp: 3 each, Rfl: 0   Multiple Vitamins-Minerals (MULTIVITAMIN WITH MINERALS) tablet, Take 1 tablet by mouth daily., Disp: , Rfl:    naloxone (NARCAN) nasal spray 4 mg/0.1 mL, Spray into nostril with signs of opioid related oversedation or overdose, Disp: 1 each, Rfl: 0   nystatin (MYCOSTATIN/NYSTOP) powder, Apply 1 Application topically 3 (three) times daily. (Patient taking  differently: Apply 1 Application topically 3 (three) times daily as needed (irritation).), Disp: 15 g, Rfl: 0   pregabalin (LYRICA) 50 MG capsule, Take 1 capsule (50 mg total) by mouth 2 (two) times daily., Disp: 180 capsule, Rfl: 0   rOPINIRole (REQUIP XL) 4 MG 24 hr tablet, Take 4 mg by mouth at bedtime., Disp: , Rfl:    tiZANidine (ZANAFLEX) 2 MG tablet, Take 2 mg by mouth every 8 (eight) hours as needed., Disp: , Rfl:    umeclidinium  bromide (INCRUSE ELLIPTA) 62.5 MCG/ACT AEPB, Inhale 1 puff into the lungs daily., Disp: 90 each, Rfl: 0   diclofenac (VOLTAREN) 75 MG EC tablet, Take 75 mg by mouth 2 (two) times daily. (Patient not taking: Reported on 03/21/2023), Disp: , Rfl:    furosemide (LASIX) 20 MG tablet, Take 0.5-1 tablets (10-20 mg total) by mouth daily as needed for fluid or edema. Take in am with potassium PRN (Patient not taking: Reported on 03/21/2023), Disp: 5 tablet, Rfl: 0   Semaglutide-Weight Management 0.25 MG/0.5ML SOAJ, Inject 0.25 mg into the skin once a week for 28 days. (Patient not taking: Reported on 03/27/2023), Disp: 2 mL, Rfl: 0   [START ON 04/13/2023] Semaglutide-Weight Management 0.5 MG/0.5ML SOAJ, Inject 0.5 mg into the skin once a week for 28 days. (Patient not taking: Reported on 03/27/2023), Disp: 2 mL, Rfl: 0   [START ON 05/12/2023] Semaglutide-Weight Management 1 MG/0.5ML SOAJ, Inject 1 mg into the skin once a week for 28 days. (Patient not taking: Reported on 03/27/2023), Disp: 2 mL, Rfl: 0   Allergies  Allergen Reactions   Chantix [Varenicline] Nausea Only   Glycopyrrolate Rash    Oral irritation     Social History   Tobacco Use   Smoking status: Former    Current packs/day: 0.00    Average packs/day: 0.5 packs/day for 36.0 years (18.0 ttl pk-yrs)    Types: Cigarettes    Start date: 03/13/1986    Quit date: 03/13/2022    Years since quitting: 1.0    Passive exposure: Past   Smokeless tobacco: Never   Tobacco comments:    Pt using nicotine patches; 1/2 PPD as of 03/21/21  Vaping Use   Vaping status: Some Days   Substances: Nicotine, Flavoring  Substance Use Topics   Alcohol use: No    Alcohol/week: 0.0 standard drinks of alcohol   Drug use: Yes    Types: Marijuana    Comment: occasionally      Chart Review Today: I personally reviewed active problem list, medication list, allergies, family history, social history, health maintenance, notes from last encounter, lab results,  imaging with the patient/caregiver today.   Review of Systems  Constitutional: Negative.   HENT: Negative.    Eyes: Negative.   Respiratory: Negative.    Cardiovascular: Negative.   Gastrointestinal: Negative.   Endocrine: Negative.   Genitourinary: Negative.   Musculoskeletal: Negative.   Skin: Negative.   Allergic/Immunologic: Negative.   Neurological: Negative.   Hematological: Negative.   Psychiatric/Behavioral: Negative.    All other systems reviewed and are negative.      Objective:   Vitals:   03/27/23 1521  BP: 122/74  Pulse: 91  Resp: 16  SpO2: 96%  Weight: 240 lb (108.9 kg)  Height: 5\' 1"  (1.549 m)    Body mass index is 45.35 kg/m.  Physical Exam Vitals and nursing note reviewed.  Constitutional:      Appearance: She is well-developed. She is obese.  HENT:  Head: Normocephalic and atraumatic.     Nose: Nose normal.  Eyes:     General:        Right eye: No discharge.        Left eye: No discharge.     Conjunctiva/sclera: Conjunctivae normal.  Neck:     Trachea: No tracheal deviation.  Cardiovascular:     Rate and Rhythm: Normal rate and regular rhythm.  Pulmonary:     Effort: Pulmonary effort is normal. No respiratory distress.     Breath sounds: No stridor.  Musculoskeletal:     Comments: From seated position in chair in exam room pt able to lift legs bilaterally against gravity, normal dorsiflexion/plantar flexions b/l, grossly normal sensation to light touch  Skin:    General: Skin is warm and dry.     Findings: No rash.  Neurological:     Mental Status: She is alert.     Motor: No abnormal muscle tone.     Coordination: Coordination normal.  Psychiatric:        Behavior: Behavior normal.      Results for orders placed or performed during the hospital encounter of 02/08/23  CBC   Collection Time: 02/08/23 11:05 AM  Result Value Ref Range   WBC 13.1 (H) 4.0 - 10.5 K/uL   RBC 4.59 3.87 - 5.11 MIL/uL   Hemoglobin 12.6 12.0 - 15.0  g/dL   HCT 09.8 11.9 - 14.7 %   MCV 85.4 80.0 - 100.0 fL   MCH 27.5 26.0 - 34.0 pg   MCHC 32.1 30.0 - 36.0 g/dL   RDW 82.9 (H) 56.2 - 13.0 %   Platelets 341 150 - 400 K/uL   nRBC 0.0 0.0 - 0.2 %  Basic metabolic panel   Collection Time: 02/08/23 11:05 AM  Result Value Ref Range   Sodium 132 (L) 135 - 145 mmol/L   Potassium 4.1 3.5 - 5.1 mmol/L   Chloride 94 (L) 98 - 111 mmol/L   CO2 27 22 - 32 mmol/L   Glucose, Bld 86 70 - 99 mg/dL   BUN 13 8 - 23 mg/dL   Creatinine, Ser 8.65 (H) 0.44 - 1.00 mg/dL   Calcium 8.9 8.9 - 78.4 mg/dL   GFR, Estimated >69 >62 mL/min   Anion gap 11 5 - 15  Troponin I (High Sensitivity)   Collection Time: 02/08/23 11:05 AM  Result Value Ref Range   Troponin I (High Sensitivity) 3 <18 ng/L  Procalcitonin   Collection Time: 02/08/23 12:07 PM  Result Value Ref Range   Procalcitonin <0.10 ng/mL  CBG monitoring, ED   Collection Time: 02/08/23  4:37 PM  Result Value Ref Range   Glucose-Capillary 156 (H) 70 - 99 mg/dL  CBG monitoring, ED   Collection Time: 02/08/23  9:49 PM  Result Value Ref Range   Glucose-Capillary 124 (H) 70 - 99 mg/dL  Basic metabolic panel   Collection Time: 02/09/23  2:26 AM  Result Value Ref Range   Sodium 126 (L) 135 - 145 mmol/L   Potassium 3.8 3.5 - 5.1 mmol/L   Chloride 89 (L) 98 - 111 mmol/L   CO2 25 22 - 32 mmol/L   Glucose, Bld 192 (H) 70 - 99 mg/dL   BUN 21 8 - 23 mg/dL   Creatinine, Ser 9.52 0.44 - 1.00 mg/dL   Calcium 8.6 (L) 8.9 - 10.3 mg/dL   GFR, Estimated >84 >13 mL/min   Anion gap 12 5 - 15  CBC  Collection Time: 02/09/23  2:26 AM  Result Value Ref Range   WBC 10.3 4.0 - 10.5 K/uL   RBC 4.35 3.87 - 5.11 MIL/uL   Hemoglobin 11.8 (L) 12.0 - 15.0 g/dL   HCT 75.6 (L) 43.3 - 29.5 %   MCV 81.8 80.0 - 100.0 fL   MCH 27.1 26.0 - 34.0 pg   MCHC 33.1 30.0 - 36.0 g/dL   RDW 18.8 (H) 41.6 - 60.6 %   Platelets 327 150 - 400 K/uL   nRBC 0.0 0.0 - 0.2 %  Hemoglobin A1c   Collection Time: 02/09/23  2:26 AM   Result Value Ref Range   Hgb A1c MFr Bld 5.9 (H) 4.8 - 5.6 %   Mean Plasma Glucose 122.63 mg/dL  CBG monitoring, ED   Collection Time: 02/09/23  8:10 AM  Result Value Ref Range   Glucose-Capillary 108 (H) 70 - 99 mg/dL  Cortisol   Collection Time: 02/09/23 11:18 AM  Result Value Ref Range   Cortisol, Plasma 1.9 ug/dL  Osmolality   Collection Time: 02/09/23 11:18 AM  Result Value Ref Range   Osmolality 279 275 - 295 mOsm/kg  CBG monitoring, ED   Collection Time: 02/09/23 11:44 AM  Result Value Ref Range   Glucose-Capillary 117 (H) 70 - 99 mg/dL  Osmolality, urine   Collection Time: 02/09/23  3:36 PM  Result Value Ref Range   Osmolality, Ur 567 300 - 900 mOsm/kg  Sodium, urine, random   Collection Time: 02/09/23  3:36 PM  Result Value Ref Range   Sodium, Ur 134 mmol/L  CBG monitoring, ED   Collection Time: 02/09/23  4:04 PM  Result Value Ref Range   Glucose-Capillary 127 (H) 70 - 99 mg/dL  Glucose, capillary   Collection Time: 02/09/23  5:49 PM  Result Value Ref Range   Glucose-Capillary 162 (H) 70 - 99 mg/dL  Glucose, capillary   Collection Time: 02/09/23  9:47 PM  Result Value Ref Range   Glucose-Capillary 145 (H) 70 - 99 mg/dL  CBC with Differential/Platelet   Collection Time: 02/10/23  6:20 AM  Result Value Ref Range   WBC 12.2 (H) 4.0 - 10.5 K/uL   RBC 4.30 3.87 - 5.11 MIL/uL   Hemoglobin 11.7 (L) 12.0 - 15.0 g/dL   HCT 30.1 (L) 60.1 - 09.3 %   MCV 82.6 80.0 - 100.0 fL   MCH 27.2 26.0 - 34.0 pg   MCHC 33.0 30.0 - 36.0 g/dL   RDW 23.5 (H) 57.3 - 22.0 %   Platelets 347 150 - 400 K/uL   nRBC 0.0 0.0 - 0.2 %   Neutrophils Relative % 79 %   Neutro Abs 9.6 (H) 1.7 - 7.7 K/uL   Lymphocytes Relative 15 %   Lymphs Abs 1.8 0.7 - 4.0 K/uL   Monocytes Relative 5 %   Monocytes Absolute 0.6 0.1 - 1.0 K/uL   Eosinophils Relative 0 %   Eosinophils Absolute 0.0 0.0 - 0.5 K/uL   Basophils Relative 0 %   Basophils Absolute 0.0 0.0 - 0.1 K/uL   Immature Granulocytes 1 %    Abs Immature Granulocytes 0.13 (H) 0.00 - 0.07 K/uL  Comprehensive metabolic panel   Collection Time: 02/10/23  6:20 AM  Result Value Ref Range   Sodium 125 (L) 135 - 145 mmol/L   Potassium 4.2 3.5 - 5.1 mmol/L   Chloride 89 (L) 98 - 111 mmol/L   CO2 25 22 - 32 mmol/L   Glucose, Bld 136 (H)  70 - 99 mg/dL   BUN 21 8 - 23 mg/dL   Creatinine, Ser 1.61 0.44 - 1.00 mg/dL   Calcium 8.7 (L) 8.9 - 10.3 mg/dL   Total Protein 6.5 6.5 - 8.1 g/dL   Albumin 3.1 (L) 3.5 - 5.0 g/dL   AST 13 (L) 15 - 41 U/L   ALT 9 0 - 44 U/L   Alkaline Phosphatase 56 38 - 126 U/L   Total Bilirubin 0.3 <1.2 mg/dL   GFR, Estimated >09 >60 mL/min   Anion gap 11 5 - 15  Magnesium   Collection Time: 02/10/23  6:20 AM  Result Value Ref Range   Magnesium 1.6 (L) 1.7 - 2.4 mg/dL  Glucose, capillary   Collection Time: 02/10/23  7:56 AM  Result Value Ref Range   Glucose-Capillary 136 (H) 70 - 99 mg/dL  Glucose, capillary   Collection Time: 02/10/23 12:08 PM  Result Value Ref Range   Glucose-Capillary 182 (H) 70 - 99 mg/dL  Glucose, capillary   Collection Time: 02/10/23  5:06 PM  Result Value Ref Range   Glucose-Capillary 114 (H) 70 - 99 mg/dL  SARS Coronavirus 2 by RT PCR (hospital order, performed in [facility] hospital lab) *cepheid single result test* Anterior Nasal Swab   Collection Time: 02/10/23  8:24 PM   Specimen: Anterior Nasal Swab  Result Value Ref Range   SARS Coronavirus 2 by RT PCR POSITIVE (A) NEGATIVE  Glucose, capillary   Collection Time: 02/10/23  9:54 PM  Result Value Ref Range   Glucose-Capillary 155 (H) 70 - 99 mg/dL  CBC with Differential/Platelet   Collection Time: 02/11/23  2:47 AM  Result Value Ref Range   WBC 14.3 (H) 4.0 - 10.5 K/uL   RBC 4.38 3.87 - 5.11 MIL/uL   Hemoglobin 11.8 (L) 12.0 - 15.0 g/dL   HCT 45.4 (L) 09.8 - 11.9 %   MCV 80.4 80.0 - 100.0 fL   MCH 26.9 26.0 - 34.0 pg   MCHC 33.5 30.0 - 36.0 g/dL   RDW 14.7 (H) 82.9 - 56.2 %   Platelets 378 150 - 400 K/uL    nRBC 0.0 0.0 - 0.2 %   Neutrophils Relative % 82 %   Neutro Abs 11.8 (H) 1.7 - 7.7 K/uL   Lymphocytes Relative 11 %   Lymphs Abs 1.5 0.7 - 4.0 K/uL   Monocytes Relative 6 %   Monocytes Absolute 0.8 0.1 - 1.0 K/uL   Eosinophils Relative 0 %   Eosinophils Absolute 0.0 0.0 - 0.5 K/uL   Basophils Relative 0 %   Basophils Absolute 0.0 0.0 - 0.1 K/uL   Immature Granulocytes 1 %   Abs Immature Granulocytes 0.13 (H) 0.00 - 0.07 K/uL  Comprehensive metabolic panel   Collection Time: 02/11/23  2:47 AM  Result Value Ref Range   Sodium 123 (L) 135 - 145 mmol/L   Potassium 4.6 3.5 - 5.1 mmol/L   Chloride 88 (L) 98 - 111 mmol/L   CO2 29 22 - 32 mmol/L   Glucose, Bld 137 (H) 70 - 99 mg/dL   BUN 29 (H) 8 - 23 mg/dL   Creatinine, Ser 1.30 0.44 - 1.00 mg/dL   Calcium 8.7 (L) 8.9 - 10.3 mg/dL   Total Protein 7.0 6.5 - 8.1 g/dL   Albumin 3.4 (L) 3.5 - 5.0 g/dL   AST 12 (L) 15 - 41 U/L   ALT 10 0 - 44 U/L   Alkaline Phosphatase 58 38 - 126 U/L  Total Bilirubin 0.5 <1.2 mg/dL   GFR, Estimated >16 >10 mL/min   Anion gap 6 5 - 15  Magnesium   Collection Time: 02/11/23  2:47 AM  Result Value Ref Range   Magnesium 2.2 1.7 - 2.4 mg/dL  Glucose, capillary   Collection Time: 02/11/23  8:42 AM  Result Value Ref Range   Glucose-Capillary 117 (H) 70 - 99 mg/dL  Glucose, capillary   Collection Time: 02/11/23 11:31 AM  Result Value Ref Range   Glucose-Capillary 132 (H) 70 - 99 mg/dL   *Note: Due to a large number of results and/or encounters for the requested time period, some results have not been displayed. A complete set of results can be found in Results Review.       Assessment & Plan:     ICD-10-CM   1. Urinary incontinence, unspecified type  R32 Ambulatory referral to Urology   feel urology eval in needed - at least bladder scan, no dysuria, hx of tubal ligation, no other surgeries, incontinence for 1 year + and worsening    2. Class 3 severe obesity with serious comorbidity and body  mass index (BMI) of 45.0 to 49.9 in adult, unspecified obesity type (HCC)  R60.454 Semaglutide-Weight Management 0.25 MG/0.5ML SOAJ   E66.01    Z68.42     3. Encounter for weight management  Z76.89 Semaglutide-Weight Management 0.25 MG/0.5ML Candie Echevaria, PA-C 03/27/23 3:33 PM

## 2023-03-28 ENCOUNTER — Telehealth: Payer: Self-pay

## 2023-03-28 NOTE — Telephone Encounter (Signed)
Message from Plan  We are unable to process your request for prior authorization for [facility] INJ 0.25MG  for the above member due to [employer] has a denied request on file for WEGOVY INJ 0.25MG  for this member. Please refer to the appeals process outlined in the original denial or contact Prior Authorization Department at [phone] for further questions.

## 2023-04-03 ENCOUNTER — Ambulatory Visit: Payer: 59 | Admitting: *Deleted

## 2023-04-03 NOTE — Patient Outreach (Signed)
  Care Coordination   Follow Up Visit Note   04/03/2023 Name: Nicole Austin MRN: [id number] DOB: January 24, 1961  Nicole Austin is a 63 y.o. year old female who sees Tapia, [name], PA-C for primary care. I spoke with  Montie Velia Fano by phone today.  What matters to the patients health and wellness today?  Patient report she is having some shortness of breath, but denies needing to seek medical attention.  State she has gained weight and feels this is causing the breathing issues.  Also admit she has not been taking medications and using nebulizers as instructed, will do so starting today.    Goals Addressed             This Visit's Progress    Effective management of chronic medical conditions   On track    Interventions Today    Flowsheet Row Most Recent Value  Chronic Disease   Chronic disease during today's visit Chronic Obstructive Pulmonary Disease (COPD)  General Interventions   General Interventions Discussed/Reviewed General Interventions Reviewed, Doctor Visits, Health Screening, Durable Medical Equipment (DME)  [Report she has been short of breath for the past couple days but denies using her oxygen ]  Doctor Visits Discussed/Reviewed Doctor Visits Reviewed, PCP, Specialist  [reviewed upcoming: Neurology 3/12, PCP 3/17]  Health Screening Colonoscopy  [colonoscopy scheduled 2/12]  Durable Medical Equipment (DME) Oxygen , Other  [encouraged to use oxygen  and to purchase pulse oximeter to monitor oxygen  saturation]  PCP/Specialist Visits Compliance with follow-up visit  Exercise Interventions   Exercise Discussed/Reviewed Weight Managment  Weight Management Weight loss, Weight maintenance  [Report she has gained about 15-20 pounds since the start of December, state she has some swelling in hands and ankles, but feels the weight gain is due to overeating]  Education Interventions   Education Provided Provided Education  Provided Verbal Education On Medication,  Nutrition, When to see the doctor  [facility] reviewed, advised to use inhalers and nebulizers for shortness of breath as she has not been using. also advised to follow up on appeal for assistance for Wegovy ]              SDOH assessments and interventions completed:  No     Care Coordination Interventions:  Yes, provided   Follow up plan: Follow up call scheduled for 2/10    Encounter Outcome:  Patient Visit Completed   Odella Ku, RN, MSN, CCM [place]  [facility], [facility] RN Care Coordinator Direct Dial: [phone] / Main [phone] Fax [phone] Email: [email or web address]: [email or web address]

## 2023-04-03 NOTE — Patient Instructions (Signed)
 Visit Information  Thank you for taking time to visit with me today. Please don't hesitate to contact me if I can be of assistance to you before our next scheduled telephone appointment.  Following are the goals we discussed today:  Call MD office if you are still having shortness of breath.  Use oxygen  and nebulizers to see if breathing is better. Monitor weights and fluid status, decrease salt intake.   Our next appointment is by telephone on 2/10  Please call the care guide team at [phone] if you need to cancel or reschedule your appointment.   Please call the Suicide and Crisis Lifeline: 988 call the [place]  National Suicide Prevention Lifeline: (909)552-1780 or TTY: 219 544 9204 TTY 215-867-2477) to talk to a trained counselor call 1-800-273-TALK (toll free, 24 hour hotline) call 911 if you are experiencing a Mental Health or Behavioral Health Crisis or need someone to talk to.  Patient verbalizes understanding of instructions and care plan provided today and agrees to view in MyChart. Active MyChart status and patient understanding of how to access instructions and care plan via MyChart confirmed with patient.     The patient has been provided with contact information for the care management team and has been advised to call with any health related questions or concerns.   Odella Ku, RN, MSN, CCM [facility], [facility] RN Care Coordinator Direct Dial: [phone] / Main [phone] Fax [phone] Email: [email or web address]: [email or web address]

## 2023-04-05 NOTE — Telephone Encounter (Signed)
 Pt is calling to ask is there another medication that she can take since the wegocy was denied by her insurance? Please advise CB- [phone]

## 2023-04-05 NOTE — Telephone Encounter (Signed)
 Called w/no answer. Unable to leave voicemail due to mailbox being full. Patient will need to contact their insurance and ask what weight loss medications are covered.

## 2023-04-07 DIAGNOSIS — J441 Chronic obstructive pulmonary disease with (acute) exacerbation: Secondary | ICD-10-CM | POA: Diagnosis not present

## 2023-04-07 DIAGNOSIS — J449 Chronic obstructive pulmonary disease, unspecified: Secondary | ICD-10-CM | POA: Diagnosis not present

## 2023-04-08 ENCOUNTER — Ambulatory Visit: Payer: Self-pay | Admitting: *Deleted

## 2023-04-08 NOTE — Patient Outreach (Signed)
  Care Coordination   Follow Up Visit Note   04/08/2023 Name: Nicole Austin MRN: [id number] DOB: 02-20-1961  Nicole Austin is a 63 y.o. year old female who sees Tapia, [name], PA-C for primary care. I spoke with  Gaspar Karma Formoso by phone today.  What matters to the patients health and wellness today?  Patient report her breathing is a little better, still feel the trouble breathing is caused by her weight gain.  She will continue using her inhalers, which is providing relief.  Denies any urgent concerns, encouraged to contact this care manager with questions.      Goals Addressed             This Visit's Progress    Effective management of chronic medical conditions       Interventions Today    Flowsheet Row Most Recent Value  Chronic Disease   Chronic disease during today's visit Chronic Obstructive Pulmonary Disease (COPD), Other  [chronic pain]  General Interventions   General Interventions Discussed/Reviewed General Interventions Reviewed, Doctor Visits, Health Screening, Communication with  Doctor Visits Discussed/Reviewed Doctor Visits Reviewed  [Reviewed upcoming: Neuro 3/12 and PCP 3/17]  Health Screening Colonoscopy  [Will start prep for colonoscopy on 2/12]  Durable Medical Equipment (DME) Other  [Nebulizer - patient report using as needed.  She will use U-card to obtain pulse oximeter]  PCP/Specialist Visits Compliance with follow-up visit  Communication with PCP/Specialists  [Notified PCP that patient has stopped Lyrica  and requesting alternative]  Exercise Interventions   Exercise Discussed/Reviewed Weight Managment  Weight Management Weight loss  [Patient working on weight loss through exercise as tolerated]  Education Interventions   Education Provided Provided Education  Provided Verbal Education On Medication, When to see the doctor  [facility] reviewed, report she has stopped taking Lyrica  because she read it can cause weight gain.  She is wanting to  see if there is an alternative for pain relief]              SDOH assessments and interventions completed:  No     Care Coordination Interventions:  Yes, provided   Follow up plan: Follow up call scheduled for 3/18    Encounter Outcome:  Patient Visit Completed   Holland Lundborg, RN, MSN, CCM [place]  [facility], [facility] RN Care Coordinator Direct Dial: [phone] / Main [phone] Fax [phone] Email: [email or web address] Website: [email or web address]

## 2023-04-08 NOTE — Patient Instructions (Signed)
 Visit Information  Thank you for taking time to visit with me today. Please don't hesitate to contact me if I can be of assistance to you before our next scheduled telephone appointment.  Following are the goals we discussed today:  Monitor oxygen  saturations once you purchase pulse oximeter. Continue using inhalers and nebulizers for shortness of breath.  Start colon prep for upcoming colonoscopy. Increase exercise as tolerated to decrease weight.   Our next appointment is by telephone on 3/18  Please call the care guide team at [phone] if you need to cancel or reschedule your appointment.   Please call the Suicide and Crisis Lifeline: 988 call the [place]  National Suicide Prevention Lifeline: (704)671-3604 or TTY: 262-459-3847 TTY 234-381-5975) to talk to a trained counselor call 1-800-273-TALK (toll free, 24 hour hotline) call 911 if you are experiencing a Mental Health or Behavioral Health Crisis or need someone to talk to.  Patient verbalizes understanding of instructions and care plan provided today and agrees to view in MyChart. Active MyChart status and patient understanding of how to access instructions and care plan via MyChart confirmed with patient.     The patient has been provided with contact information for the care management team and has been advised to call with any health related questions or concerns.   Holland Lundborg, RN, MSN, CCM [facility] [place], [facility] RN Care Coordinator Direct Dial: [phone] / Main [phone] Fax [phone] Email: [email or web address] Website: [email or web address]

## 2023-04-09 ENCOUNTER — Encounter: Payer: Self-pay | Admitting: Gastroenterology

## 2023-04-09 DIAGNOSIS — J441 Chronic obstructive pulmonary disease with (acute) exacerbation: Secondary | ICD-10-CM | POA: Diagnosis not present

## 2023-04-09 DIAGNOSIS — J449 Chronic obstructive pulmonary disease, unspecified: Secondary | ICD-10-CM | POA: Diagnosis not present

## 2023-04-10 ENCOUNTER — Encounter: Payer: Self-pay | Admitting: Registered Nurse

## 2023-04-10 ENCOUNTER — Ambulatory Visit: Admission: RE | Admit: 2023-04-10 | Payer: 59 | Source: Home / Self Care | Admitting: Gastroenterology

## 2023-04-10 SURGERY — COLONOSCOPY WITH PROPOFOL
Anesthesia: General

## 2023-04-12 ENCOUNTER — Ambulatory Visit: Payer: 59 | Admitting: Internal Medicine

## 2023-04-22 ENCOUNTER — Encounter: Payer: Self-pay | Admitting: Family Medicine

## 2023-04-22 ENCOUNTER — Ambulatory Visit (INDEPENDENT_AMBULATORY_CARE_PROVIDER_SITE_OTHER): Payer: 59 | Admitting: Family Medicine

## 2023-04-22 ENCOUNTER — Other Ambulatory Visit (HOSPITAL_COMMUNITY)
Admission: RE | Admit: 2023-04-22 | Discharge: 2023-04-22 | Disposition: A | Discharge status: Home / Self Care | Payer: 59 | Source: Ambulatory Visit | Attending: Family Medicine | Admitting: Family Medicine

## 2023-04-22 ENCOUNTER — Ambulatory Visit: Payer: Self-pay | Admitting: Family Medicine

## 2023-04-22 VITALS — BP 134/76 | HR 92 | Resp 16 | Ht 61.0 in | Wt 246.0 lb

## 2023-04-22 DIAGNOSIS — R21 Rash and other nonspecific skin eruption: Secondary | ICD-10-CM

## 2023-04-22 DIAGNOSIS — N9489 Other specified conditions associated with female genital organs and menstrual cycle: Secondary | ICD-10-CM | POA: Diagnosis present

## 2023-04-22 DIAGNOSIS — Z22322 Carrier or suspected carrier of Methicillin resistant Staphylococcus aureus: Secondary | ICD-10-CM

## 2023-04-22 DIAGNOSIS — B372 Candidiasis of skin and nail: Secondary | ICD-10-CM | POA: Diagnosis not present

## 2023-04-22 DIAGNOSIS — J449 Chronic obstructive pulmonary disease, unspecified: Secondary | ICD-10-CM

## 2023-04-22 DIAGNOSIS — Z8673 Personal history of transient ischemic attack (TIA), and cerebral infarction without residual deficits: Secondary | ICD-10-CM

## 2023-04-22 DIAGNOSIS — L03314 Cellulitis of groin: Secondary | ICD-10-CM

## 2023-04-22 DIAGNOSIS — Z7689 Persons encountering health services in other specified circumstances: Secondary | ICD-10-CM

## 2023-04-22 DIAGNOSIS — Z6841 Body Mass Index (BMI) 40.0 and over, adult: Secondary | ICD-10-CM

## 2023-04-22 DIAGNOSIS — I252 Old myocardial infarction: Secondary | ICD-10-CM | POA: Diagnosis not present

## 2023-04-22 MED ORDER — MICONAZOLE NITRATE 2 % EX POWD: Freq: Two times a day (BID) | CUTANEOUS | 2 refills | Status: AC | PRN

## 2023-04-22 MED ORDER — DOXYCYCLINE HYCLATE 100 MG PO TABS: 100.0000 mg | ORAL_TABLET | Freq: Two times a day (BID) | ORAL | 0 refills | Status: DC

## 2023-04-22 MED ORDER — FLUCONAZOLE 150 MG PO TABS: ORAL_TABLET | ORAL | 0 refills | Status: DC

## 2023-04-22 MED ORDER — SEMAGLUTIDE-WEIGHT MANAGEMENT 0.5 MG/0.5ML ~~LOC~~ SOAJ: 0.5000 mg | SUBCUTANEOUS | 0 refills | Status: AC

## 2023-04-22 MED ORDER — SEMAGLUTIDE-WEIGHT MANAGEMENT 0.25 MG/0.5ML ~~LOC~~ SOAJ: 0.2500 mg | SUBCUTANEOUS | 0 refills | Status: AC

## 2023-04-22 NOTE — Telephone Encounter (Signed)
 Pt has pain and burning in groin area                 Chief Complaint: Burning outside vagina, "small bumps. Ive tried my Nystatin, but it doesn't help." Symptoms: Above Frequency: Weekend Pertinent Negatives: Patient denies itchibg Disposition: [] ED /[] Urgent Care (no appt availability in office) / [x] Appointment(In office/virtual)/ []  [facility]/ [] Home Care/ [] Refused Recommended Disposition /[] [facility]/ []  Follow-up with PCP Additional Notes: Pt. Agrees with appointment.  Reason for Disposition  [1] MILD-MODERATE pain AND [2] present > 24 hours  (Exception: Chronic pain.)  Answer Assessment - Initial Assessment Questions 1. SYMPTOM: "What's the main symptom you're concerned about?" (e.g., pain, itching, dryness)     Burning, skin is raw 2. LOCATION: "Where is the   located?" (e.g., inside/outside, left/right)     Outside 3. ONSET: "When did the    start?"     3 days 4. PAIN: "Is there any pain?" If Yes, ask: "How bad is it?" (Scale: 1-10; mild, moderate, severe)   -  MILD (1-3): Doesn't interfere with normal activities.    -  MODERATE (4-7): Interferes with normal activities (e.g., work or school) or awakens from sleep.     -  SEVERE (8-10): Excruciating pain, unable to do any normal activities.     Severe 5. ITCHING: "Is there any itching?" If Yes, ask: "How bad is it?" (Scale: 1-10; mild, moderate, severe)     No 6. CAUSE: "What do you think is causing the discharge?" "Have you had the same problem before? What happened then?"     Unsure 7. OTHER SYMPTOMS: "Do you have any other symptoms?" (e.g., fever, itching, vaginal bleeding, pain with urination, injury to genital area, vaginal foreign body)     No 8. PREGNANCY: "Is there any chance you are pregnant?" "When was your last menstrual period?"     No  Protocols used: Vaginal Symptoms-A-AH

## 2023-04-22 NOTE — Progress Notes (Signed)
 Patient ID: Nicole Austin, female    DOB: [birth year], 63 y.o.   MRN: [id number]  PCP: Danelle Berry, PA-C  Chief Complaint  Patient presents with   Vaginal Burning    X1 week, Nystatin cream not helping    Subjective:   Nicole Austin is a 63 y.o. female, presents to clinic with CC of the following:  HPI   Rash to right groin and skin fold with odor and vaginal discharge Skin red, very painful, with odor and moisture/discharge She is unable to keep the skin fold dry but she has tried Hx of same in the past that lead to skin infections and abscesses, she went to PCP and ER several times in the past, had ID consults and needed [facility] RN skin/wound care to help improve similar sx in 2023  Obesity - pt a good candidate for GLP-1 but it was denied by insurance Wt Readings from Last 5 Encounters:  04/22/23 246 lb (111.6 kg)  03/27/23 240 lb (108.9 kg)  03/15/23 240 lb 1.6 oz (108.9 kg)  02/08/23 225 lb (102.1 kg)  02/06/23 224 lb 13.9 oz (102 kg)   BMI Readings from Last 5 Encounters:  04/22/23 46.48 kg/m  03/27/23 45.35 kg/m  03/15/23 45.37 kg/m  02/08/23 42.51 kg/m  02/06/23 42.49 kg/m   Appear submitted end of January - on chart there is PMHx of stroke and MI (added to past med hx in 2018 - I cannot see who added it) there are ER encounters which also document pt reporting hx of stroke and MI "a few years ago" and then in 2016 " a few months ago"  I can see no cardiac cath or PCI, no MRI or CT brain with evidence of stroke. No currently seeing cardiology She has seen neurology in the past but not for stroke      Patient Active Problem List   Diagnosis Date Noted   Bipolar affective disorder, current episode mixed (HCC) 03/15/2023   COPD exacerbation (HCC) 02/08/2023   COVID-19 virus infection 02/08/2023   S/P total left hip arthroplasty 10/09/2022   SIADH (syndrome of inappropriate ADH production) (HCC) 07/09/2022   Iron deficiency anemia 07/07/2022    History of methicillin resistant staphylococcus aureus (MRSA) 05/03/2022   Avascular necrosis of bones of both hips (HCC) 05/03/2022   Chronic prescription benzodiazepine use 05/03/2022   Polypharmacy 04/09/2022   Sinus bradycardia 04/09/2022   Depression with anxiety 04/09/2022   MRSA (methicillin resistant staph aureus) culture positive 05/04/2021   Prolonged QT interval 01/01/2021   Bipolar disorder with depression (HCC) 02/25/2017   ADD (attention deficit disorder) 02/25/2017   Marijuana use 02/13/2017   Prediabetes 05/20/2016   Neuropathy, peripheral 05/07/2016   Vitamin B12 deficiency 10/24/2015   Fibromyalgia 08/30/2015   OP (osteoporosis) 06/30/2015   Sleep apnea 06/30/2015   HLD (hyperlipidemia) 05/13/2015   Class 3 severe obesity with serious comorbidity and body mass index (BMI) of 45.0 to 49.9 in adult (HCC) 08/16/2014   Obesity, Class III, BMI 40-49.9 (morbid obesity) (HCC) 08/16/2014   Low back pain with sciatica 08/04/2014   Tobacco abuse 08/04/2014   Anxiety, generalized 11/24/2013   COPD (chronic obstructive pulmonary disease) (HCC) 11/18/2013   Osteoarthritis of knee, unspecified 07/15/2013      Current Outpatient Medications:    ALPRAZolam (XANAX) 0.5 MG tablet, Take 1 tablet (0.5 mg total) by mouth 2 (two) times daily., Disp: 10 tablet, Rfl: 0   celecoxib (CELEBREX) 200 MG capsule, Take 200  mg by mouth daily., Disp: , Rfl:    divalproex (DEPAKOTE) 250 MG DR tablet, Take 1 tablet (250 mg total) by mouth 2 (two) times daily., Disp: 60 tablet, Rfl: 2   escitalopram (LEXAPRO) 20 MG tablet, Take 20 mg by mouth daily., Disp: , Rfl:    Lurasidone HCl (LATUDA) 60 MG TABS, Take 1 tablet (60 mg total) by mouth daily with breakfast., Disp: 30 tablet, Rfl: 0   mometasone-formoterol (DULERA) 100-5 MCG/ACT AERO, Inhale 2 puffs into the lungs 2 (two) times daily., Disp: 3 each, Rfl: 0   Multiple Vitamins-Minerals (MULTIVITAMIN WITH MINERALS) tablet, Take 1 tablet by mouth  daily., Disp: , Rfl:    naloxone (NARCAN) nasal spray 4 mg/0.1 mL, Spray into nostril with signs of opioid related oversedation or overdose, Disp: 1 each, Rfl: 0   nystatin (MYCOSTATIN/NYSTOP) powder, Apply 1 Application topically 3 (three) times daily. (Patient taking differently: Apply 1 Application topically 3 (three) times daily as needed (irritation).), Disp: 15 g, Rfl: 0   pregabalin (LYRICA) 50 MG capsule, Take 1 capsule (50 mg total) by mouth 2 (two) times daily., Disp: 180 capsule, Rfl: 0   rOPINIRole (REQUIP XL) 4 MG 24 hr tablet, Take 4 mg by mouth at bedtime., Disp: , Rfl:    Semaglutide-Weight Management 0.25 MG/0.5ML SOAJ, Inject 0.25 mg into the skin once a week for 28 days., Disp: 2 mL, Rfl: 0   tiZANidine (ZANAFLEX) 2 MG tablet, Take 2 mg by mouth every 8 (eight) hours as needed., Disp: , Rfl:    umeclidinium bromide (INCRUSE ELLIPTA) 62.5 MCG/ACT AEPB, Inhale 1 puff into the lungs daily., Disp: 90 each, Rfl: 0   diclofenac (VOLTAREN) 75 MG EC tablet, Take 75 mg by mouth 2 (two) times daily. (Patient not taking: Reported on 04/22/2023), Disp: , Rfl:    furosemide (LASIX) 20 MG tablet, Take 0.5-1 tablets (10-20 mg total) by mouth daily as needed for fluid or edema. Take in am with potassium PRN (Patient not taking: Reported on 03/21/2023), Disp: 5 tablet, Rfl: 0   Semaglutide-Weight Management 0.5 MG/0.5ML SOAJ, Inject 0.5 mg into the skin once a week for 28 days. (Patient not taking: Reported on 04/22/2023), Disp: 2 mL, Rfl: 0   [START ON 05/12/2023] Semaglutide-Weight Management 1 MG/0.5ML SOAJ, Inject 1 mg into the skin once a week for 28 days. (Patient not taking: Reported on 04/22/2023), Disp: 2 mL, Rfl: 0   Allergies  Allergen Reactions   Chantix [Varenicline] Nausea Only   Glycopyrrolate Rash    Oral irritation     Social History   Tobacco Use   Smoking status: Former    Current packs/day: 0.00    Average packs/day: 0.5 packs/day for 36.0 years (18.0 ttl pk-yrs)     Types: Cigarettes    Start date: 03/13/1986    Quit date: 03/13/2022    Years since quitting: 1.1    Passive exposure: Past   Smokeless tobacco: Never   Tobacco comments:    Pt using nicotine patches; 1/2 PPD as of 03/21/21  Vaping Use   Vaping status: Some Days   Substances: Nicotine, Flavoring  Substance Use Topics   Alcohol use: No    Alcohol/week: 0.0 standard drinks of alcohol   Drug use: Yes    Types: Marijuana    Comment: occasionally      Chart Review Today: I personally reviewed active problem list, medication list, allergies, family history, social history, health maintenance, notes from last encounter, lab results, imaging with the patient/caregiver  today.   Review of Systems  Constitutional: Negative.   HENT: Negative.    Eyes: Negative.   Respiratory: Negative.    Cardiovascular: Negative.   Gastrointestinal: Negative.   Endocrine: Negative.   Genitourinary: Negative.   Musculoskeletal: Negative.   Skin: Negative.   Allergic/Immunologic: Negative.   Neurological: Negative.   Hematological: Negative.   Psychiatric/Behavioral: Negative.    All other systems reviewed and are negative.      Objective:   Vitals:   04/22/23 1542  BP: 134/76  Pulse: 92  Resp: 16  SpO2: 96%  Weight: 246 lb (111.6 kg)  Height: 5\' 1"  (1.549 m)    Body mass index is 46.48 kg/m.  Physical Exam Vitals and nursing note reviewed.  Constitutional:      Appearance: She is well-developed. She is obese.  HENT:     Head: Normocephalic and atraumatic.     Nose: Nose normal.  Eyes:     General:        Right eye: No discharge.        Left eye: No discharge.     Conjunctiva/sclera: Conjunctivae normal.  Neck:     Trachea: No tracheal deviation.  Cardiovascular:     Rate and Rhythm: Normal rate and regular rhythm.  Pulmonary:     Effort: Pulmonary effort is normal. No respiratory distress.     Breath sounds: No stridor.  Skin:    General: Skin is warm.     Findings:  Erythema and rash present.  Neurological:     Mental Status: She is alert.     Motor: No abnormal muscle tone.     Coordination: Coordination normal.     Gait: Gait abnormal.  Psychiatric:        Behavior: Behavior normal.      Results for orders placed or performed during the hospital encounter of 02/08/23  CBC   Collection Time: 02/08/23 11:05 AM  Result Value Ref Range   WBC 13.1 (H) 4.0 - 10.5 K/uL   RBC 4.59 3.87 - 5.11 MIL/uL   Hemoglobin 12.6 12.0 - 15.0 g/dL   HCT 16.1 09.6 - 04.5 %   MCV 85.4 80.0 - 100.0 fL   MCH 27.5 26.0 - 34.0 pg   MCHC 32.1 30.0 - 36.0 g/dL   RDW 40.9 (H) 81.1 - 91.4 %   Platelets 341 150 - 400 K/uL   nRBC 0.0 0.0 - 0.2 %  Basic metabolic panel   Collection Time: 02/08/23 11:05 AM  Result Value Ref Range   Sodium 132 (L) 135 - 145 mmol/L   Potassium 4.1 3.5 - 5.1 mmol/L   Chloride 94 (L) 98 - 111 mmol/L   CO2 27 22 - 32 mmol/L   Glucose, Bld 86 70 - 99 mg/dL   BUN 13 8 - 23 mg/dL   Creatinine, Ser 7.82 (H) 0.44 - 1.00 mg/dL   Calcium 8.9 8.9 - 95.6 mg/dL   GFR, Estimated >21 >30 mL/min   Anion gap 11 5 - 15  Troponin I (High Sensitivity)   Collection Time: 02/08/23 11:05 AM  Result Value Ref Range   Troponin I (High Sensitivity) 3 <18 ng/L  Procalcitonin   Collection Time: 02/08/23 12:07 PM  Result Value Ref Range   Procalcitonin <0.10 ng/mL  CBG monitoring, ED   Collection Time: 02/08/23  4:37 PM  Result Value Ref Range   Glucose-Capillary 156 (H) 70 - 99 mg/dL  CBG monitoring, ED   Collection Time: 02/08/23  9:49  PM  Result Value Ref Range   Glucose-Capillary 124 (H) 70 - 99 mg/dL  Basic metabolic panel   Collection Time: 02/09/23  2:26 AM  Result Value Ref Range   Sodium 126 (L) 135 - 145 mmol/L   Potassium 3.8 3.5 - 5.1 mmol/L   Chloride 89 (L) 98 - 111 mmol/L   CO2 25 22 - 32 mmol/L   Glucose, Bld 192 (H) 70 - 99 mg/dL   BUN 21 8 - 23 mg/dL   Creatinine, Ser 0.10 0.44 - 1.00 mg/dL   Calcium 8.6 (L) 8.9 - 10.3 mg/dL    GFR, Estimated >27 >25 mL/min   Anion gap 12 5 - 15  CBC   Collection Time: 02/09/23  2:26 AM  Result Value Ref Range   WBC 10.3 4.0 - 10.5 K/uL   RBC 4.35 3.87 - 5.11 MIL/uL   Hemoglobin 11.8 (L) 12.0 - 15.0 g/dL   HCT 36.6 (L) 44.0 - 34.7 %   MCV 81.8 80.0 - 100.0 fL   MCH 27.1 26.0 - 34.0 pg   MCHC 33.1 30.0 - 36.0 g/dL   RDW 42.5 (H) 95.6 - 38.7 %   Platelets 327 150 - 400 K/uL   nRBC 0.0 0.0 - 0.2 %  Hemoglobin A1c   Collection Time: 02/09/23  2:26 AM  Result Value Ref Range   Hgb A1c MFr Bld 5.9 (H) 4.8 - 5.6 %   Mean Plasma Glucose 122.63 mg/dL  CBG monitoring, ED   Collection Time: 02/09/23  8:10 AM  Result Value Ref Range   Glucose-Capillary 108 (H) 70 - 99 mg/dL  Cortisol   Collection Time: 02/09/23 11:18 AM  Result Value Ref Range   Cortisol, Plasma 1.9 ug/dL  Osmolality   Collection Time: 02/09/23 11:18 AM  Result Value Ref Range   Osmolality 279 275 - 295 mOsm/kg  CBG monitoring, ED   Collection Time: 02/09/23 11:44 AM  Result Value Ref Range   Glucose-Capillary 117 (H) 70 - 99 mg/dL  Osmolality, urine   Collection Time: 02/09/23  3:36 PM  Result Value Ref Range   Osmolality, Ur 567 300 - 900 mOsm/kg  Sodium, urine, random   Collection Time: 02/09/23  3:36 PM  Result Value Ref Range   Sodium, Ur 134 mmol/L  CBG monitoring, ED   Collection Time: 02/09/23  4:04 PM  Result Value Ref Range   Glucose-Capillary 127 (H) 70 - 99 mg/dL  Glucose, capillary   Collection Time: 02/09/23  5:49 PM  Result Value Ref Range   Glucose-Capillary 162 (H) 70 - 99 mg/dL  Glucose, capillary   Collection Time: 02/09/23  9:47 PM  Result Value Ref Range   Glucose-Capillary 145 (H) 70 - 99 mg/dL  CBC with Differential/Platelet   Collection Time: 02/10/23  6:20 AM  Result Value Ref Range   WBC 12.2 (H) 4.0 - 10.5 K/uL   RBC 4.30 3.87 - 5.11 MIL/uL   Hemoglobin 11.7 (L) 12.0 - 15.0 g/dL   HCT 56.4 (L) 33.2 - 95.1 %   MCV 82.6 80.0 - 100.0 fL   MCH 27.2 26.0 - 34.0 pg    MCHC 33.0 30.0 - 36.0 g/dL   RDW 88.4 (H) 16.6 - 06.3 %   Platelets 347 150 - 400 K/uL   nRBC 0.0 0.0 - 0.2 %   Neutrophils Relative % 79 %   Neutro Abs 9.6 (H) 1.7 - 7.7 K/uL   Lymphocytes Relative 15 %   Lymphs Abs 1.8 0.7 -  4.0 K/uL   Monocytes Relative 5 %   Monocytes Absolute 0.6 0.1 - 1.0 K/uL   Eosinophils Relative 0 %   Eosinophils Absolute 0.0 0.0 - 0.5 K/uL   Basophils Relative 0 %   Basophils Absolute 0.0 0.0 - 0.1 K/uL   Immature Granulocytes 1 %   Abs Immature Granulocytes 0.13 (H) 0.00 - 0.07 K/uL  Comprehensive metabolic panel   Collection Time: 02/10/23  6:20 AM  Result Value Ref Range   Sodium 125 (L) 135 - 145 mmol/L   Potassium 4.2 3.5 - 5.1 mmol/L   Chloride 89 (L) 98 - 111 mmol/L   CO2 25 22 - 32 mmol/L   Glucose, Bld 136 (H) 70 - 99 mg/dL   BUN 21 8 - 23 mg/dL   Creatinine, Ser 2.53 0.44 - 1.00 mg/dL   Calcium 8.7 (L) 8.9 - 10.3 mg/dL   Total Protein 6.5 6.5 - 8.1 g/dL   Albumin 3.1 (L) 3.5 - 5.0 g/dL   AST 13 (L) 15 - 41 U/L   ALT 9 0 - 44 U/L   Alkaline Phosphatase 56 38 - 126 U/L   Total Bilirubin 0.3 <1.2 mg/dL   GFR, Estimated >66 >44 mL/min   Anion gap 11 5 - 15  Magnesium   Collection Time: 02/10/23  6:20 AM  Result Value Ref Range   Magnesium 1.6 (L) 1.7 - 2.4 mg/dL  Glucose, capillary   Collection Time: 02/10/23  7:56 AM  Result Value Ref Range   Glucose-Capillary 136 (H) 70 - 99 mg/dL  Glucose, capillary   Collection Time: 02/10/23 12:08 PM  Result Value Ref Range   Glucose-Capillary 182 (H) 70 - 99 mg/dL  Glucose, capillary   Collection Time: 02/10/23  5:06 PM  Result Value Ref Range   Glucose-Capillary 114 (H) 70 - 99 mg/dL  SARS Coronavirus 2 by RT PCR (hospital order, performed in [facility] hospital lab) *cepheid single result test* Anterior Nasal Swab   Collection Time: 02/10/23  8:24 PM   Specimen: Anterior Nasal Swab  Result Value Ref Range   SARS Coronavirus 2 by RT PCR POSITIVE (A) NEGATIVE  Glucose, capillary    Collection Time: 02/10/23  9:54 PM  Result Value Ref Range   Glucose-Capillary 155 (H) 70 - 99 mg/dL  CBC with Differential/Platelet   Collection Time: 02/11/23  2:47 AM  Result Value Ref Range   WBC 14.3 (H) 4.0 - 10.5 K/uL   RBC 4.38 3.87 - 5.11 MIL/uL   Hemoglobin 11.8 (L) 12.0 - 15.0 g/dL   HCT 03.4 (L) 74.2 - 59.5 %   MCV 80.4 80.0 - 100.0 fL   MCH 26.9 26.0 - 34.0 pg   MCHC 33.5 30.0 - 36.0 g/dL   RDW 63.8 (H) 75.6 - 43.3 %   Platelets 378 150 - 400 K/uL   nRBC 0.0 0.0 - 0.2 %   Neutrophils Relative % 82 %   Neutro Abs 11.8 (H) 1.7 - 7.7 K/uL   Lymphocytes Relative 11 %   Lymphs Abs 1.5 0.7 - 4.0 K/uL   Monocytes Relative 6 %   Monocytes Absolute 0.8 0.1 - 1.0 K/uL   Eosinophils Relative 0 %   Eosinophils Absolute 0.0 0.0 - 0.5 K/uL   Basophils Relative 0 %   Basophils Absolute 0.0 0.0 - 0.1 K/uL   Immature Granulocytes 1 %   Abs Immature Granulocytes 0.13 (H) 0.00 - 0.07 K/uL  Comprehensive metabolic panel   Collection Time: 02/11/23  2:47 AM  Result Value Ref Range   Sodium 123 (L) 135 - 145 mmol/L   Potassium 4.6 3.5 - 5.1 mmol/L   Chloride 88 (L) 98 - 111 mmol/L   CO2 29 22 - 32 mmol/L   Glucose, Bld 137 (H) 70 - 99 mg/dL   BUN 29 (H) 8 - 23 mg/dL   Creatinine, Ser 6.29 0.44 - 1.00 mg/dL   Calcium 8.7 (L) 8.9 - 10.3 mg/dL   Total Protein 7.0 6.5 - 8.1 g/dL   Albumin 3.4 (L) 3.5 - 5.0 g/dL   AST 12 (L) 15 - 41 U/L   ALT 10 0 - 44 U/L   Alkaline Phosphatase 58 38 - 126 U/L   Total Bilirubin 0.5 <1.2 mg/dL   GFR, Estimated >52 >84 mL/min   Anion gap 6 5 - 15  Magnesium   Collection Time: 02/11/23  2:47 AM  Result Value Ref Range   Magnesium 2.2 1.7 - 2.4 mg/dL  Glucose, capillary   Collection Time: 02/11/23  8:42 AM  Result Value Ref Range   Glucose-Capillary 117 (H) 70 - 99 mg/dL  Glucose, capillary   Collection Time: 02/11/23 11:31 AM  Result Value Ref Range   Glucose-Capillary 132 (H) 70 - 99 mg/dL   *Note: Due to a large number of results and/or  encounters for the requested time period, some results have not been displayed. A complete set of results can be found in Results Review.       Assessment & Plan:     ICD-10-CM   1. Vaginal burning  N94.89 Cervicovaginal ancillary only   cytology vaginal self swab obtained    2. Rash and nonspecific skin eruption  R21 miconazole (MICOTIN) 2 % powder    Ambulatory referral to Home Health   suspect intertrigo, hx of the same, severe infections in 2023    3. Cellulitis of groin  L03.314 doxycycline (VIBRA-TABS) 100 MG tablet    Ambulatory referral to Home Health   right groin, very inflammed, raw, tender, start doxy and diflucan with miconazole powders and HH RN for wound care    4. Candidal intertrigo  B37.2 fluconazole (DIFLUCAN) 150 MG tablet    miconazole (MICOTIN) 2 % powder    Ambulatory referral to Home Health   diflucan 2 week course with focus on keeping skin fold dry and avoiding friction/rubbing    5. MRSA (methicillin resistant staph aureus) culture positive  Z22.322 Ambulatory referral to Home Health   previously requiring ID consult    6. Class 3 severe obesity with serious comorbidity and body mass index (BMI) of 45.0 to 49.9 in adult, unspecified obesity type [facility])  X32.440 Ambulatory referral to Home Health   E66.01    Z68.42    weight increasing, no contraindication to GLP-1, will refer to medical weight management and rx wegovy again    7. Chronic obstructive pulmonary disease, unspecified COPD type (HCC)  J44.9 Ambulatory referral to Home Health    8. Encounter for weight management  Z76.89    retry rx for GLP-1    9. History of MI (myocardial infarction)  I25.2    cannot validate, no cath lab procedure or PCI, no stress tests or cardiology consults    10. History of stroke  Z86.73    can only find mention of it in neuro consult in 2021 - see below  Per [facility] care everywhere chart review 12/29/2019 OV note:  5. History of MRI negative stroke in 2016  with mild residual weakness  affecting right leg and right arm. (Her right hand dexterity has returned to near baseline) Per patient history of stroke in 2016: affecting the right side of the body.  MRI brain and CT head both negative. Start Aspirin 81 mg           Danelle Berry, PA-C 04/22/23 3:51 PM

## 2023-04-23 ENCOUNTER — Telehealth: Payer: Self-pay

## 2023-04-23 NOTE — Telephone Encounter (Signed)
 Message from Plan  We are unable to process your request for prior authorization for [facility] [employer] INJ 0.25MG  for the above member due to [employer] has a denied request on file for WEGOVY INJ 0.25MG  for this member. Please refer to the appeals process outlined in the original denial or contact Prior Authorization Department at [phone] for further questions.

## 2023-04-24 LAB — CERVICOVAGINAL ANCILLARY ONLY
Bacterial Vaginitis (gardnerella): NEGATIVE
Candida Glabrata: NEGATIVE
Candida Vaginitis: NEGATIVE
Chlamydia: NEGATIVE
Comment: NEGATIVE
Comment: NEGATIVE
Comment: NEGATIVE
Comment: NEGATIVE
Comment: NEGATIVE
Comment: NORMAL
Neisseria Gonorrhea: NEGATIVE
Trichomonas: NEGATIVE

## 2023-05-01 ENCOUNTER — Telehealth: Payer: Self-pay

## 2023-05-01 NOTE — Telephone Encounter (Signed)
 Copied from CRM [id number]. Topic: Clinical - Prescription Issue >> May 01, 2023 11:18 AM Ivette P wrote: Reason for CRM: pt called in to notify that no one has reached out to her regarding the home health orders she received from NP Tapia. Pt would like someone to follow up with her about the Home Health Help. Pt callback [phone].

## 2023-05-02 NOTE — Telephone Encounter (Signed)
 Called patient and made aware. Number given.

## 2023-05-06 ENCOUNTER — Ambulatory Visit: Payer: 59 | Admitting: Family Medicine

## 2023-05-07 DIAGNOSIS — J441 Chronic obstructive pulmonary disease with (acute) exacerbation: Secondary | ICD-10-CM | POA: Diagnosis not present

## 2023-05-07 DIAGNOSIS — J449 Chronic obstructive pulmonary disease, unspecified: Secondary | ICD-10-CM | POA: Diagnosis not present

## 2023-05-13 ENCOUNTER — Ambulatory Visit: Payer: Self-pay | Admitting: Family Medicine

## 2023-05-14 ENCOUNTER — Ambulatory Visit: Payer: Self-pay | Admitting: *Deleted

## 2023-05-14 NOTE — Patient Outreach (Signed)
 Care Coordination   Follow Up Visit Note   05/14/2023 Name: Nicole Austin MRN: [id number] DOB: Jul 09, 1960  Nicole Austin is a 63 y.o. year old female who sees Danelle Berry, [place] for primary care. I spoke with  Vena Rua Hosang by phone today.  What matters to the patients health and wellness today?  Patient report having a cough, but state it is due to weather changes.  Denies shortness of breath.  Recently diagnosed with cellulitis of the groin, state it is better.     Goals Addressed             This Visit's Progress    Effective management of chronic medical conditions   On track    Interventions Today    Flowsheet Row Most Recent Value  Chronic Disease   Chronic disease during today's visit Chronic Obstructive Pulmonary Disease (COPD), Other  [Cellulitis of the groin]  General Interventions   General Interventions Discussed/Reviewed General Interventions Reviewed, Doctor Visits, Durable Medical Equipment (DME)  [facility] health referral sent to [facility] for nursing/wound care, pt denies being contacted for services yet. Message sent to [place] with [facility] to follow up on referral and insurance approval.]  Doctor Visits Discussed/Reviewed Doctor Visits Reviewed, PCP, Specialist  [Upcoming with urology 4/7. PCP appt yesterday canceled due to improvement with cellulitis, aware of need to schedule routine follow up]  Durable Medical Equipment (DME) Other  [pulse oximeter - still does not have oximeter, advised again to purchase]  Exercise Interventions   Exercise Discussed/Reviewed Weight Managment  Weight Management Weight loss  [Wegovy was  not approved by insurance, she is on appeal]  Education Interventions   Education Provided Provided Education  Provided Verbal Education On Medication, When to see the doctor, Other  [Meds reviewed, completed course of antifungal and antibiotics. Advised of signs/symptoms of infection and when to follow up with MD. Has topical  ointment if needed]              SDOH assessments and interventions completed:  No     Care Coordination Interventions:  Yes, provided   Follow up plan: Follow up call scheduled for 3/20    Encounter Outcome:  Patient Visit Completed   Rodney Langton, RN, MSN, CCM [place]  [facility], [facility] RN Care Coordinator Direct Dial: [phone] / Main [phone] Fax [phone] Email: [email or web address]: [email or web address]

## 2023-05-14 NOTE — Patient Instructions (Signed)
 Visit Information  Thank you for taking time to visit with me today. Please don't hesitate to contact me if I can be of assistance to you before our next scheduled telephone appointment.  Following are the goals we discussed today:  Follow up with insurance company about approval of Wegovy. Purchase pulse oximeter. Keep groin clean and dry, wash with mild soap and water.  Our next appointment is by telephone on 3/20 at 1pm  Please call the care guide team at [phone] if you need to cancel or reschedule your appointment.   Please call the Suicide and Crisis Lifeline: 988 call the [place] National Suicide Prevention Lifeline: (445)759-2917 or TTY: (260)274-1648 TTY 725 544 2110) to talk to a trained counselor if you are experiencing a Mental Health or Behavioral Health Crisis or need someone to talk to.  Patient verbalizes understanding of instructions and care plan provided today and agrees to view in MyChart. Active MyChart status and patient understanding of how to access instructions and care plan via MyChart confirmed with patient.     The patient has been provided with contact information for the care management team and has been advised to call with any health related questions or concerns.   Rodney Langton, RN, MSN, CCM [facility], [facility] RN Care Coordinator Direct Dial: [phone] / Main [phone] Fax [phone] Email: [email or web address]: [email or web address]

## 2023-05-16 ENCOUNTER — Ambulatory Visit: Admitting: *Deleted

## 2023-05-16 NOTE — Patient Outreach (Signed)
 Care Coordination   Follow Up Visit Note   05/16/2023 Name: Nicole Austin MRN: [id number] DOB: September 02, 1960  Nicole Austin is a 63 y.o. year old female who sees Danelle Berry, [place] for primary care. I spoke with  Vena Rua [place] by phone today.  What matters to the patients health and wellness today?  Patient report cellulitis continues to heal, verbalized understanding of inability to secure home health services.  Call was placed to [facility], notified that case was declined. PCP agrees that home health order can be canceled as condition has improved.  Denies any urgent concerns, encouraged to contact this care manager with questions.     Goals Addressed             This Visit's Progress    Effective management of chronic medical conditions   On track    Interventions Today    Flowsheet Row Most Recent Value  Chronic Disease   Chronic disease during today's visit Chronic Obstructive Pulmonary Disease (COPD), Other  [healing cellulitis and chronic left leg pain]  General Interventions   General Interventions Discussed/Reviewed Health Screening, General Interventions Reviewed, Communication with  [Discussed referral for home health for cellulitis, advised of inability to secure services, she understands and is ok with not having since cellulitis is better]  Health Screening Colonoscopy  [canceled recently scheduled colonoscopy, requesting to have cologaurd done instead due to trouble securing person to take her for appointment]  Communication with PCP/Specialists  [PCP notified of inability to secure [facility] services.  Also inquired about home cologuard kit]  Exercise Interventions   Exercise Discussed/Reviewed Physical Activity  Physical Activity Discussed/Reviewed Physical Activity Reviewed  [Report having some pain in her leg with walking, advised to use walker to increase stability and decrease fall]              SDOH assessments and interventions completed:   No     Care Coordination Interventions:  Yes, provided   Follow up plan: Follow up call scheduled for 4/18 with D. Chilton Si, RNCM, patient aware    Encounter Outcome:  Patient Visit Completed   Rodney Langton, RN, MSN, CCM [facility], [facility] RN Care Coordinator Direct Dial: [phone] / Main [phone] Fax [phone] Email: [email or web address]

## 2023-06-03 ENCOUNTER — Ambulatory Visit: Payer: 59 | Admitting: Urology

## 2023-06-04 ENCOUNTER — Encounter: Payer: Self-pay | Admitting: Urology

## 2023-06-05 DIAGNOSIS — J449 Chronic obstructive pulmonary disease, unspecified: Secondary | ICD-10-CM | POA: Diagnosis not present

## 2023-06-05 DIAGNOSIS — J441 Chronic obstructive pulmonary disease with (acute) exacerbation: Secondary | ICD-10-CM | POA: Diagnosis not present

## 2023-06-07 DIAGNOSIS — J441 Chronic obstructive pulmonary disease with (acute) exacerbation: Secondary | ICD-10-CM | POA: Diagnosis not present

## 2023-06-07 DIAGNOSIS — J449 Chronic obstructive pulmonary disease, unspecified: Secondary | ICD-10-CM | POA: Diagnosis not present

## 2023-06-28 ENCOUNTER — Ambulatory Visit: Admitting: Family Medicine

## 2023-07-01 ENCOUNTER — Ambulatory Visit: Payer: Self-pay

## 2023-07-01 ENCOUNTER — Ambulatory Visit: Admitting: Family Medicine

## 2023-07-01 ENCOUNTER — Telehealth: Payer: Self-pay

## 2023-07-01 ENCOUNTER — Other Ambulatory Visit: Payer: Self-pay | Admitting: Family Medicine

## 2023-07-01 ENCOUNTER — Telehealth (INDEPENDENT_AMBULATORY_CARE_PROVIDER_SITE_OTHER): Admitting: Family Medicine

## 2023-07-01 DIAGNOSIS — L304 Erythema intertrigo: Secondary | ICD-10-CM

## 2023-07-01 DIAGNOSIS — M25512 Pain in left shoulder: Secondary | ICD-10-CM

## 2023-07-01 NOTE — Telephone Encounter (Signed)
  Chief Complaint: redness/odor to abd skin fold Symptoms: pain, raw, itchy Frequency: several months, worse since yesterday when our of nystatin  Pertinent Negatives: Patient denies fever, drainage, DM,  Disposition: [] ED /[] Urgent Care (no appt availability in office) / [x] Appointment(In office/virtual)/ []  [place] [place] [facility]/ [] Home Care/ [] Refused Recommended Disposition /[] [facility]/ []  Follow-up with PCP Additional Notes: Pt states that she has had hx of this rash in her skin fold. Pt states that it is red, raw, itchy, and odorous. Pt states that normally nystatin  powder helps with it but states that she ran out of the medication yesterday. Pt would like it refilled. Pt scheduled for 5/7, states that she could not come in earlier d/t  transportation issues.

## 2023-07-01 NOTE — Telephone Encounter (Signed)
 Copied from CRM [id number]. Topic: MyChart - Other >> Jul 01, 2023  1:51 PM Nicole Austin wrote: Reason for CRM: Pt called in because was scheduled a video visit. Pt called into report that does not have access to mychart. Attempted to get pt to log in to identify error she receives. Pt does not know how to navigate phone while on call. Pt asked if the link for video visit will be sent advised if pt receives regular texts from myChart she will receive video link text.   Pt does not have another device to attempt to log in.   Pt will callback if any issues.

## 2023-07-01 NOTE — Progress Notes (Signed)
 Name: Nicole Austin   MRN: [id number]    DOB: 07/17/1960   Date:07/01/2023       Progress Note  Subjective:    Chief Complaint  Chief Complaint  Patient presents with   Shoulder Pain    Left, X1 month, "aching". Elevation helps, taking OTC Tylenol . Taking old Flexeril  rx. Hasn't tried heat or ice.     I connected with  Nicole Austin  on 07/01/23 at  3:40 PM EDT by a video enabled telemedicine application  Pt audio garbled and unclear/cutting out and video did not connect at all  Attempted to reconnect with her 3x between 3:40 and 4:10  Pt will need to reschedule in person since she is unable to sustain connection that allows virtual video platform Audio cutting out - can only hear parts of words  verified that I am speaking with the correct person using two identifiers.  I discussed the limitations of evaluation and management by telemedicine and the availability of in person appointments. The patient expressed understanding and agreed to proceed. Staff also discussed with the patient that there may be a patient responsible charge related to this service. Patient Location:  Provider Location:  Additional Individuals present:   HPI   Patient Active Problem List   Diagnosis Date Noted   Bipolar affective disorder, current episode mixed (HCC) 03/15/2023   COPD exacerbation (HCC) 02/08/2023   COVID-19 virus infection 02/08/2023   S/P total left hip arthroplasty 10/09/2022   SIADH (syndrome of inappropriate ADH production) (HCC) 07/09/2022   Iron deficiency anemia 07/07/2022   History of methicillin resistant staphylococcus aureus (MRSA) 05/03/2022   Avascular necrosis of bones of both hips (HCC) 05/03/2022   Chronic prescription benzodiazepine use 05/03/2022   Polypharmacy 04/09/2022   Sinus bradycardia 04/09/2022   Depression with anxiety 04/09/2022   MRSA (methicillin resistant staph aureus) culture positive 05/04/2021   Prolonged QT interval 01/01/2021    Bipolar disorder with depression (HCC) 02/25/2017   ADD (attention deficit disorder) 02/25/2017   Marijuana use 02/13/2017   Prediabetes 05/20/2016   Neuropathy, peripheral 05/07/2016   Vitamin B12 deficiency 10/24/2015   Fibromyalgia 08/30/2015   OP (osteoporosis) 06/30/2015   Sleep apnea 06/30/2015   HLD (hyperlipidemia) 05/13/2015   Class 3 severe obesity with serious comorbidity and body mass index (BMI) of 45.0 to 49.9 in adult 08/16/2014   Obesity, Class III, BMI 40-49.9 (morbid obesity) 08/16/2014   Low back pain with sciatica 08/04/2014   Tobacco abuse 08/04/2014   Anxiety, generalized 11/24/2013   COPD (chronic obstructive pulmonary disease) (HCC) 11/18/2013   Osteoarthritis of knee, unspecified 07/15/2013    Social History   Tobacco Use   Smoking status: Former    Current packs/day: 0.00    Average packs/day: 0.5 packs/day for 36.0 years (18.0 ttl pk-yrs)    Types: Cigarettes    Start date: 03/13/1986    Quit date: 03/13/2022    Years since quitting: 1.3    Passive exposure: Past   Smokeless tobacco: Never   Tobacco comments:    Pt using nicotine  patches; 1/2 PPD as of 03/21/21  Substance Use Topics   Alcohol use: No    Alcohol/week: 0.0 standard drinks of alcohol     Current Outpatient Medications:    ALPRAZolam  (XANAX ) 0.5 MG tablet, Take 1 tablet (0.5 mg total) by mouth 2 (two) times daily., Disp: 10 tablet, Rfl: 0   celecoxib (CELEBREX) 200 MG capsule, Take 200 mg by mouth daily., Disp: , Rfl:  divalproex  (DEPAKOTE ) 250 MG DR tablet, Take 1 tablet (250 mg total) by mouth 2 (two) times daily., Disp: 60 tablet, Rfl: 2   escitalopram  (LEXAPRO ) 20 MG tablet, Take 20 mg by mouth daily., Disp: , Rfl:    fluconazole  (DIFLUCAN ) 150 MG tablet, Take 300 mg (2 tablets) by mouth x 1 day and then take 150 mg po every day x 13 days, Disp: 15 tablet, Rfl: 0   Lurasidone  HCl (LATUDA ) 60 MG TABS, Take 1 tablet (60 mg total) by mouth daily with breakfast., Disp: 30 tablet,  Rfl: 0   miconazole  (MICOTIN) 2 % powder, Apply topically 2 (two) times daily as needed for itching (Candidal intertrigo)., Disp: 85 g, Rfl: 2   Multiple Vitamins-Minerals (MULTIVITAMIN WITH MINERALS) tablet, Take 1 tablet by mouth daily., Disp: , Rfl:    naloxone  (NARCAN ) nasal spray 4 mg/0.1 mL, Spray into nostril with signs of opioid related oversedation or overdose, Disp: 1 each, Rfl: 0   nystatin  (MYCOSTATIN /NYSTOP ) powder, Apply 1 Application topically 3 (three) times daily. (Patient taking differently: Apply 1 Application topically 3 (three) times daily as needed (irritation).), Disp: 15 g, Rfl: 0   rOPINIRole  (REQUIP  XL) 4 MG 24 hr tablet, Take 4 mg by mouth at bedtime., Disp: , Rfl:    tiZANidine  (ZANAFLEX ) 2 MG tablet, Take 2 mg by mouth every 8 (eight) hours as needed., Disp: , Rfl:    umeclidinium bromide  (INCRUSE ELLIPTA ) 62.5 MCG/ACT AEPB, Inhale 1 puff into the lungs daily., Disp: 90 each, Rfl: 0   diclofenac  (VOLTAREN ) 75 MG EC tablet, Take 75 mg by mouth 2 (two) times daily. (Patient not taking: Reported on 03/21/2023), Disp: , Rfl:    furosemide  (LASIX ) 20 MG tablet, Take 0.5-1 tablets (10-20 mg total) by mouth daily as needed for fluid or edema. Take in am with potassium PRN (Patient not taking: Reported on 03/21/2023), Disp: 5 tablet, Rfl: 0   mometasone -formoterol  (DULERA ) 100-5 MCG/ACT AERO, Inhale 2 puffs into the lungs 2 (two) times daily., Disp: 3 each, Rfl: 0   pregabalin  (LYRICA ) 50 MG capsule, Take 1 capsule (50 mg total) by mouth 2 (two) times daily. (Patient not taking: Reported on 07/01/2023), Disp: 180 capsule, Rfl: 0  Allergies  Allergen Reactions   Chantix  [Varenicline ] Nausea Only   Glycopyrrolate  Rash    Oral irritation      Review of Systems    Objective:   Virtual encounter, vitals limited, only able to obtain the following There were no vitals filed for this visit. There is no height or weight on file to calculate BMI. Nursing Note and Vital Signs  reviewed.  Physical Exam  PE limited by virtual encounter  No results found. However, due to the size of the patient record, not all encounters were searched. Please check Results Review for a complete set of results.  Assessment and Plan:   Needs in person appt to assess Or can go to emergortho urgent care  -Red flags and when to present for emergency care or RTC including fever >101.61F, chest pain, shortness of breath, new/worsening/un-resolving symptoms, reviewed with patient at time of visit. Follow up and care instructions discussed and provided in AVS. - I discussed the assessment and treatment plan with the patient. The patient was provided an opportunity to ask questions and all were answered. The patient agreed with the plan and demonstrated an understanding of the instructions.  I provided  minutes of non-face-to-face time during this encounter.  Adeline Hone, PA-C 07/01/23 3:53 PM

## 2023-07-01 NOTE — Telephone Encounter (Signed)
 Reason for Disposition . [1] Localized rash is very painful AND [2] no fever  Answer Assessment - Initial Assessment Questions 1. APPEARANCE of RASH: "Describe the rash."      Red, raw, bad odor 2. LOCATION: "Where is the rash located?"      R groin, same side as last 2 times 3. NUMBER: "How many spots are there?"      Along the entire underside of the abd fold 4. SIZE: "How big are the spots?" (Inches, centimeters or compare to size of a coin)      One big spot 5. ONSET: "When did the rash start?"      Several months, pt states she ran out of her nystatin  powder 6. ITCHING: "Does the rash itch?" If Yes, ask: "How bad is the itch?"  (Scale 0-10; or none, mild, moderate, severe)     No I can't scratch it is raw 7. PAIN: "Does the rash hurt?" If Yes, ask: "How bad is the pain?"  (Scale 0-10; or none, mild, moderate, severe)    - NONE (0): no pain    - MILD (1-3): doesn't interfere with normal activities     - MODERATE (4-7): interferes with normal activities or awakens from sleep     - SEVERE (8-10): excruciating pain, unable to do any normal activities     10 8. OTHER SYMPTOMS: "Do you have any other symptoms?" (e.g., fever)     denies  Protocols used: Rash or Redness - Localized-A-AH

## 2023-07-01 NOTE — Telephone Encounter (Signed)
 Copied from CRM [id number]. Topic: Clinical - Red Word Triage >> Jul 01, 2023  4:17 PM Nicole Austin wrote: Red Word that prompted transfer to Nurse Triage: Patient is calling to report rash that is bleeding. Had telemed apt but unable to complete due to no wifi- provider advised to schedule in office appointment. Please advise

## 2023-07-02 MED ORDER — NYSTATIN 100000 UNIT/GM EX POWD: 1.0000 | Freq: Three times a day (TID) | CUTANEOUS | 2 refills | Status: DC | PRN

## 2023-07-02 NOTE — Addendum Note (Signed)
 Addended by: Jasmyn Picha on: 07/02/2023 01:03 PM   Modules accepted: Orders

## 2023-07-03 ENCOUNTER — Ambulatory Visit (INDEPENDENT_AMBULATORY_CARE_PROVIDER_SITE_OTHER): Admitting: Family Medicine

## 2023-07-03 ENCOUNTER — Encounter: Payer: Self-pay | Admitting: Family Medicine

## 2023-07-03 VITALS — BP 122/74 | HR 90 | Resp 16 | Ht 61.0 in | Wt 235.0 lb

## 2023-07-03 DIAGNOSIS — I252 Old myocardial infarction: Secondary | ICD-10-CM | POA: Diagnosis not present

## 2023-07-03 DIAGNOSIS — L03314 Cellulitis of groin: Secondary | ICD-10-CM

## 2023-07-03 DIAGNOSIS — E66813 Obesity, class 3: Secondary | ICD-10-CM | POA: Diagnosis not present

## 2023-07-03 DIAGNOSIS — B372 Candidiasis of skin and nail: Secondary | ICD-10-CM | POA: Diagnosis not present

## 2023-07-03 DIAGNOSIS — Z713 Dietary counseling and surveillance: Secondary | ICD-10-CM | POA: Diagnosis not present

## 2023-07-03 MED ORDER — DOXYCYCLINE HYCLATE 100 MG PO TABS: 100.0000 mg | ORAL_TABLET | Freq: Two times a day (BID) | ORAL | 0 refills | Status: AC

## 2023-07-03 MED ORDER — SEMAGLUTIDE-WEIGHT MANAGEMENT 0.5 MG/0.5ML ~~LOC~~ SOAJ: 0.5000 mg | SUBCUTANEOUS | 0 refills | Status: DC

## 2023-07-03 MED ORDER — SEMAGLUTIDE-WEIGHT MANAGEMENT 0.25 MG/0.5ML ~~LOC~~ SOAJ: 0.2500 mg | SUBCUTANEOUS | 0 refills | Status: AC

## 2023-07-03 MED ORDER — SEMAGLUTIDE-WEIGHT MANAGEMENT 1 MG/0.5ML ~~LOC~~ SOAJ: 1.0000 mg | SUBCUTANEOUS | 2 refills | Status: DC

## 2023-07-03 MED ORDER — FLUCONAZOLE 150 MG PO TABS: 150.0000 mg | ORAL_TABLET | Freq: Every day | ORAL | 0 refills | Status: DC

## 2023-07-03 NOTE — Progress Notes (Signed)
 Patient ID: Nicole Austin, female    DOB: December 25, 1960, 63 y.o.   MRN: [id number]  PCP: Adeline Hone, PA-C  Chief Complaint  Patient presents with   Referral    Ortho   Rash    Inner thighs    Subjective:   Nicole Austin is a 63 y.o. female, presents to clinic with CC of the following:  HPI  Skin fold/thighs/groin rash/raw burning again Hx of intertrigo and cellulitis requiring oral antifungals and antibiotics and in the past MRSA and needed ID and IVAb Not as severe as past episodes, she came in earlier this time, she got rx antifungal powder yesterday at pharmacy  Pt asks about wegovy  again - see past OV, meds denies previously with PA Will retry and asked pt to call insurance about preferred pharmacy  Patient Active Problem List   Diagnosis Date Noted   Bipolar affective disorder, current episode mixed (HCC) 03/15/2023   COPD exacerbation (HCC) 02/08/2023   COVID-19 virus infection 02/08/2023   S/P total left hip arthroplasty 10/09/2022   SIADH (syndrome of inappropriate ADH production) (HCC) 07/09/2022   Iron deficiency anemia 07/07/2022   History of methicillin resistant staphylococcus aureus (MRSA) 05/03/2022   Avascular necrosis of bones of both hips (HCC) 05/03/2022   Chronic prescription benzodiazepine use 05/03/2022   Polypharmacy 04/09/2022   Sinus bradycardia 04/09/2022   Depression with anxiety 04/09/2022   MRSA (methicillin resistant staph aureus) culture positive 05/04/2021   Prolonged QT interval 01/01/2021   Bipolar disorder with depression (HCC) 02/25/2017   ADD (attention deficit disorder) 02/25/2017   Marijuana use 02/13/2017   Prediabetes 05/20/2016   Neuropathy, peripheral 05/07/2016   Vitamin B12 deficiency 10/24/2015   Fibromyalgia 08/30/2015   OP (osteoporosis) 06/30/2015   Sleep apnea 06/30/2015   HLD (hyperlipidemia) 05/13/2015   Class 3 severe obesity with serious comorbidity and body mass index (BMI) of 45.0 to 49.9 in  adult 08/16/2014   Obesity, Class III, BMI 40-49.9 (morbid obesity) 08/16/2014   Low back pain with sciatica 08/04/2014   Tobacco abuse 08/04/2014   Anxiety, generalized 11/24/2013   COPD (chronic obstructive pulmonary disease) (HCC) 11/18/2013   Osteoarthritis of knee, unspecified 07/15/2013      Current Outpatient Medications:    ALPRAZolam  (XANAX ) 0.5 MG tablet, Take 1 tablet (0.5 mg total) by mouth 2 (two) times daily., Disp: 10 tablet, Rfl: 0   celecoxib (CELEBREX) 200 MG capsule, Take 200 mg by mouth daily., Disp: , Rfl:    divalproex  (DEPAKOTE ) 250 MG DR tablet, Take 1 tablet (250 mg total) by mouth 2 (two) times daily., Disp: 60 tablet, Rfl: 2   escitalopram  (LEXAPRO ) 20 MG tablet, Take 20 mg by mouth daily., Disp: , Rfl:    fluconazole  (DIFLUCAN ) 150 MG tablet, Take 300 mg (2 tablets) by mouth x 1 day and then take 150 mg po every day x 13 days, Disp: 15 tablet, Rfl: 0   Lurasidone  HCl (LATUDA ) 60 MG TABS, Take 1 tablet (60 mg total) by mouth daily with breakfast., Disp: 30 tablet, Rfl: 0   miconazole  (MICOTIN) 2 % powder, Apply topically 2 (two) times daily as needed for itching (Candidal intertrigo)., Disp: 85 g, Rfl: 2   Multiple Vitamins-Minerals (MULTIVITAMIN WITH MINERALS) tablet, Take 1 tablet by mouth daily., Disp: , Rfl:    naloxone  (NARCAN ) nasal spray 4 mg/0.1 mL, Spray into nostril with signs of opioid related oversedation or overdose, Disp: 1 each, Rfl: 0   nystatin  (MYCOSTATIN /NYSTOP ) powder, Apply 1  Application topically 3 (three) times daily as needed (yeast/fungal rashes or intertrigo)., Disp: 60 g, Rfl: 2   rOPINIRole  (REQUIP  XL) 4 MG 24 hr tablet, Take 4 mg by mouth at bedtime., Disp: , Rfl:    tiZANidine  (ZANAFLEX ) 2 MG tablet, Take 2 mg by mouth every 8 (eight) hours as needed., Disp: , Rfl:    umeclidinium bromide  (INCRUSE ELLIPTA ) 62.5 MCG/ACT AEPB, Inhale 1 puff into the lungs daily., Disp: 90 each, Rfl: 0   diclofenac  (VOLTAREN ) 75 MG EC tablet, Take 75 mg  by mouth 2 (two) times daily. (Patient not taking: Reported on 07/03/2023), Disp: , Rfl:    furosemide  (LASIX ) 20 MG tablet, Take 0.5-1 tablets (10-20 mg total) by mouth daily as needed for fluid or edema. Take in am with potassium PRN (Patient not taking: Reported on 03/21/2023), Disp: 5 tablet, Rfl: 0   mometasone -formoterol  (DULERA ) 100-5 MCG/ACT AERO, Inhale 2 puffs into the lungs 2 (two) times daily., Disp: 3 each, Rfl: 0   pregabalin  (LYRICA ) 50 MG capsule, Take 1 capsule (50 mg total) by mouth 2 (two) times daily. (Patient not taking: Reported on 05/14/2023), Disp: 180 capsule, Rfl: 0   Allergies  Allergen Reactions   Chantix  [Varenicline ] Nausea Only   Glycopyrrolate  Rash    Oral irritation     Social History   Tobacco Use   Smoking status: Former    Current packs/day: 0.00    Average packs/day: 0.5 packs/day for 36.0 years (18.0 ttl pk-yrs)    Types: Cigarettes    Start date: 03/13/1986    Quit date: 03/13/2022    Years since quitting: 1.3    Passive exposure: Past   Smokeless tobacco: Never   Tobacco comments:    Pt using nicotine  patches; 1/2 PPD as of 03/21/21  Vaping Use   Vaping status: Some Days   Substances: Nicotine , Flavoring  Substance Use Topics   Alcohol use: No    Alcohol/week: 0.0 standard drinks of alcohol   Drug use: Yes    Types: Marijuana    Comment: occasionally      Chart Review Today: I personally reviewed active problem list, medication list, allergies, family history, social history, health maintenance, notes from last encounter, lab results, imaging with the patient/caregiver today.   Review of Systems  Constitutional: Negative.   HENT: Negative.    Eyes: Negative.   Respiratory: Negative.    Cardiovascular: Negative.   Gastrointestinal: Negative.   Endocrine: Negative.   Genitourinary: Negative.   Musculoskeletal: Negative.   Skin: Negative.   Allergic/Immunologic: Negative.   Neurological: Negative.   Hematological: Negative.    Psychiatric/Behavioral: Negative.    All other systems reviewed and are negative.      Objective:   Vitals:   07/03/23 1335  BP: 122/74  Pulse: 90  Resp: 16  SpO2: 94%  Weight: 235 lb (106.6 kg)  Height: 5\' 1"  (1.549 m)    Body mass index is 44.4 kg/m.  Physical Exam Vitals and nursing note reviewed.  Constitutional:      General: She is not in acute distress.    Appearance: She is well-developed. She is obese. She is not ill-appearing, toxic-appearing or diaphoretic.  HENT:     Head: Normocephalic and atraumatic.     Nose: Nose normal.  Eyes:     General:        Right eye: No discharge.        Left eye: No discharge.     Conjunctiva/sclera: Conjunctivae normal.  Neck:  Trachea: No tracheal deviation.  Cardiovascular:     Rate and Rhythm: Normal rate and regular rhythm.  Pulmonary:     Effort: Pulmonary effort is normal. No respiratory distress.     Breath sounds: No stridor.  Musculoskeletal:        General: Normal range of motion.  Skin:    General: Skin is warm and dry.     Findings: Erythema and rash present.     Comments: Raw confluent area to bilateral groin and skin folds  Neurological:     Mental Status: She is alert.     Motor: No abnormal muscle tone.     Coordination: Coordination normal.  Psychiatric:        Behavior: Behavior normal.      Results for orders placed or performed in visit on 04/22/23  Cervicovaginal ancillary only   Collection Time: 04/22/23  3:45 PM  Result Value Ref Range   Neisseria Gonorrhea Negative    Chlamydia Negative    Trichomonas Negative    Bacterial Vaginitis (gardnerella) Negative    Candida Vaginitis Negative    Candida Glabrata Negative    Comment      Normal Reference Range Bacterial Vaginosis - Negative   Comment Normal Reference Ranger Chlamydia - Negative    Comment      Normal Reference Range Neisseria Gonorrhea - Negative   Comment Normal Reference Range Candida Species - Negative    Comment  Normal Reference Range Candida Galbrata - Negative    Comment Normal Reference Range Trichomonas - Negative    *Note: Due to a large number of results and/or encounters for the requested time period, some results have not been displayed. A complete set of results can be found in Results Review.       Assessment & Plan:     ICD-10-CM   1. Candidal intertrigo  B37.2 fluconazole  (DIFLUCAN ) 150 MG tablet    Ambulatory referral to Dermatology   reviewed preventative measures again, unfortunately very flared up/inflamed w/ cellulitis and pt need oral meds again    2. Cellulitis of groin  L03.314 doxycycline  (VIBRA -TABS) 100 MG tablet   bilateral groin, very inflammed, raw, tender, start doxy and diflucan  with miconazole  powders, derm consult, recurrent pt has been unable to prevent    3. Obesity, Class III, BMI 40-49.9 (morbid obesity)  Z61.096 Semaglutide -Weight Management 0.25 MG/0.5ML SOAJ    Semaglutide -Weight Management 0.5 MG/0.5ML SOAJ    Semaglutide -Weight Management 1 MG/0.5ML SOAJ   no contraindications to GLP-1 see prior OV for same 03/15/2023    4. Dietary counseling and surveillance  Z71.3 Semaglutide -Weight Management 0.25 MG/0.5ML SOAJ    Semaglutide -Weight Management 0.5 MG/0.5ML SOAJ    Semaglutide -Weight Management 1 MG/0.5ML SOAJ   reviewed recommendations again    5. History of MI (myocardial infarction)  I25.2 Semaglutide -Weight Management 0.25 MG/0.5ML SOAJ    Semaglutide -Weight Management 0.5 MG/0.5ML SOAJ    Semaglutide -Weight Management 1 MG/0.5ML SOAJ   per given hx not verified by available records          Adeline Hone, PA-C 07/03/23 1:46 PM

## 2023-07-04 ENCOUNTER — Telehealth: Payer: Self-pay | Admitting: Pharmacy Technician

## 2023-07-04 ENCOUNTER — Other Ambulatory Visit (HOSPITAL_COMMUNITY): Payer: Self-pay

## 2023-07-04 NOTE — Telephone Encounter (Signed)
 Pharmacy Patient Advocate Encounter   Received notification from Onbase that prior authorization for WEGOVY  2.5MG  is required/requested.   Insurance verification completed.   The patient is insured through [facility] .   Per test claim: PA required; PA submitted to above mentioned insurance via CoverMyMeds Key/confirmation #/EOC [id number] Status is pending

## 2023-07-04 NOTE — Telephone Encounter (Signed)
 Pharmacy Patient Advocate Encounter  Received notification from [employer] that Prior Authorization for Wegovy  0.25MG /0.5ML auto-injectors has been APPROVED from 07/04/23 to 02/26/24. Ran test claim, Copay is $0.00. This test claim was processed through [facility]- copay amounts may vary at other pharmacies due to pharmacy/plan contracts, or as the patient moves through the different stages of their insurance plan.   PA #/Case ID/Reference #: [id number]

## 2023-07-05 DIAGNOSIS — J449 Chronic obstructive pulmonary disease, unspecified: Secondary | ICD-10-CM | POA: Diagnosis not present

## 2023-07-05 DIAGNOSIS — J441 Chronic obstructive pulmonary disease with (acute) exacerbation: Secondary | ICD-10-CM | POA: Diagnosis not present

## 2023-07-05 NOTE — Telephone Encounter (Signed)
 Pt is scheduled

## 2023-07-07 DIAGNOSIS — J441 Chronic obstructive pulmonary disease with (acute) exacerbation: Secondary | ICD-10-CM | POA: Diagnosis not present

## 2023-07-07 DIAGNOSIS — J449 Chronic obstructive pulmonary disease, unspecified: Secondary | ICD-10-CM | POA: Diagnosis not present

## 2023-07-12 DIAGNOSIS — M25512 Pain in left shoulder: Secondary | ICD-10-CM | POA: Diagnosis not present

## 2023-07-15 ENCOUNTER — Ambulatory Visit: Payer: Self-pay

## 2023-07-15 NOTE — Telephone Encounter (Signed)
Called and lvm informing patient

## 2023-07-15 NOTE — Telephone Encounter (Signed)
  Chief Complaint: nausea after taking medication Wegovy . Requesting phenergan  last refill 2023.  Symptoms: requesting medication to take prior to taking Wegovy  shot on Thursdays. Reported nausea 30-45 minutes after taking injection. No vomiting.  Frequency: since starting Wegovy  Pertinent Negatives: Patient denies vomiting, no fever reported.  Disposition: [] ED /[] Urgent Care (no appt availability in office) / [x] Appointment(In office/virtual)/ []  [place] [facility]/ [] Home Care/ [] Refused Recommended Disposition /[] [place] [facility]/ []  Follow-up with PCP Additional Notes:   Please advise if appt required or if medication can be prescribed. Recommended patient schedule appt due to last phenergan  prescribed was in 2023. Patient agreed to schedule appt 07/17/23. Patient had to arrange transportation. Please advise       Reason for Disposition  Taking prescription medication that could cause nausea (e.g., narcotics/opiates, antibiotics, OCPs, many others)  Answer Assessment - Initial Assessment Questions 1. NAUSEA SEVERITY: "How bad is the nausea?" (e.g., mild, moderate, severe; dehydration, weight loss)   - MILD: loss of appetite without change in eating habits   - MODERATE: decreased oral intake without significant weight loss, dehydration, or malnutrition   - SEVERE: inadequate caloric or fluid intake, significant weight loss, symptoms of dehydration     Mild nausea 30-45 minutes after taking wegovy   2. ONSET: "When did the nausea begin?"     After starting medications  3. VOMITING: "Any vomiting?" If Yes, ask: "How many times today?"     No  4. RECURRENT SYMPTOM: "Have you had nausea before?" If Yes, ask: "When was the last time?" "What happened that time?"     Yes phenergan  worked  5. CAUSE: "What do you think is causing the nausea?"     Possible medication wegovy   6. PREGNANCY: "Is there any chance you are pregnant?" (e.g., unprotected intercourse, missed birth control  pill, broken condom)     na  Protocols used: Nausea-A-AH

## 2023-07-15 NOTE — Telephone Encounter (Signed)
 Attempted to call patient regarding s/s below. No response noted, left discrete VM appropriately.    Copied from CRM [id number]. Topic: Clinical - Medical Advice >> Jul 15, 2023  3:28 PM Crispin Dolphin wrote: Reason for CRM: Patient called states she gets a little nauseous after taking WeGovy . States she was taking promethazine  (PHENERGAN ) injection 6.25-12.5 mg  but has run out and provider that prescribed it is no longer available. Would like to know if there was something provider could suggest or send it to help with nausea when taking WeGovy . Thank you

## 2023-07-17 ENCOUNTER — Ambulatory Visit: Admitting: Family Medicine

## 2023-07-18 ENCOUNTER — Ambulatory Visit: Admitting: Internal Medicine

## 2023-07-25 ENCOUNTER — Ambulatory Visit: Payer: Self-pay

## 2023-07-25 NOTE — Telephone Encounter (Signed)
  Chief Complaint: left shoulder/arm pain Symptoms: severe left shoulder/arm pain Frequency: x 3 weeks Pertinent Negatives: Patient denies numbness, rash, fever, swelling or bruising. Disposition: [] ED /[x] Urgent Care (no appt availability in office) / [] Appointment(In office/virtual)/ []  [place] [facility]/ [] Home Care/ [] Refused Recommended Disposition /[] [place] [facility]/ []  Follow-up with PCP Additional Notes: Patient states she saw the orthopedic doctor last week and was given a shot and placed in a sling. Called [facility] and spoke with Grafton Lawrence, no available appointments at [facility] today. Patient states she has an appointment on June 6th but she can not wait that long. She states she has tried calling orthopedic doctor and is not able to get a hold of anyone. Patient offered appointments tomorrow with [facility] or the mobile medicine clinic today. Patient states she prefers to go to urgent care.  Copied from CRM [id number]. Topic: Clinical - Red Word Triage >> Jul 25, 2023  2:21 PM Nicole Austin wrote: Red Word that prompted transfer to Nurse Triage: patient calling about left arm pain, shoulder to elbow, injured arm about 3 weeks Reason for Disposition  [1] SEVERE pain AND [2] not improved 2 hours after pain medicine  Answer Assessment - Initial Assessment Questions 1. ONSET: "When did the pain start?"     X 3 weeks.  2. LOCATION: "Where is the pain located?"     Left shoulder, mid way down the shoulder and down left arm.  3. PAIN: "How bad is the pain?" (Scale 1-10; or mild, moderate, severe)   - MILD (1-3): doesn't interfere with normal activities   - MODERATE (4-7): interferes with normal activities (e.g., work or school) or awakens from sleep   - SEVERE (8-10): excruciating pain, unable to do any normal activities, unable to move arm at all due to pain     10/10. She states she took Aleve  2.5 hours ago.  4. WORK OR EXERCISE: "Has there been any recent work or exercise that involved this  part of the body?"     Injured left shoulder/arm 4 weeks ago.  5. CAUSE: "What do you think is causing the shoulder pain?"     She states the orthopedic doctor told her she had cracked the bone.  6. OTHER SYMPTOMS: "Do you have any other symptoms?" (e.g., neck pain, swelling, rash, fever, numbness, weakness)     Weakness in left arm, neck pain.  7. PREGNANCY: "Is there any chance you are pregnant?" "When was your last menstrual period?"     N/A.  Protocols used: Shoulder Pain-A-AH

## 2023-07-26 ENCOUNTER — Ambulatory Visit: Admitting: Podiatry

## 2023-07-29 ENCOUNTER — Other Ambulatory Visit: Payer: Self-pay

## 2023-07-29 NOTE — Patient Outreach (Signed)
 Complex Care Management   Visit Note  07/29/2023  Name:  Nicole Austin MRN: [id number] DOB: October 02, 1960  Situation: Referral received for Complex Care Management related to COPD I obtained verbal consent from Patient.  Visit completed with patient  on the phone  Background:   Past Medical History:  Diagnosis Date   ADHD (attention deficit hyperactivity disorder)    Apnea, sleep 06/30/2015   Arthritis    Asthma    Asthma with acute exacerbation 06/30/2015   Benign neoplasm of ascending colon    Bipolar 1 disorder (HCC)    Chronic pain    COPD, moderate (HCC) 11/18/2013   Depression    DOE (dyspnea on exertion)    Fall 07/04/2022   GERD (gastroesophageal reflux disease) 08/30/2015   Gout 08/16/2014   History of acute myocardial infarction 06/30/2015   History of panic attacks    HPV (human papilloma virus) infection 07/17/2013   Hyperlipidemia    Left knee pain 07/06/2022   Low HDL (under 40) 08/30/2015   MI (myocardial infarction) (HCC) 2018   MRSA infection 2023   groin abscess   Nose colonized with MRSA 03/21/2022   a.) PCR (+) prior to LEFT THA   Osteoporosis    Parkinson disease (HCC)    Polyp of sigmoid colon    Pre-diabetes    Prediabetes 05/20/2016   PTSD (post-traumatic stress disorder)    Restless leg syndrome    Rhabdomyolysis    Sleep apnea 06/30/2015   Stage 3 severe COPD by GOLD classification (HCC) 11/18/2013   Stroke (HCC) 2018   right arm weakness   Traumatic rhabdomyolysis (HCC) 07/04/2022   Tremors of nervous system    hands   Uncomplicated asthma 06/30/2015   Varicella 02/25/2017    Assessment: Patient Reported Symptoms:  Cognitive Cognitive Status: Alert and oriented to person, place, and time, Insightful and able to interpret abstract concepts, Normal speech and language skills Cognitive/Intellectual Conditions Management [RPT]: None reported or documented in medical history or problem list   Health Maintenance Behaviors: Annual  physical exam Healing Pattern: Average  Neurological Neurological Review of Symptoms: Other: [place] Neurological Symptoms/Conditions [RPT]: tingling Neurological Conditions:  (patient reports back/ neck issues which may  cause tingling in bilateral arm.) Neurological Management Strategies: Medication therapy, Routine screening  HEENT HEENT Symptoms Reported: Other: Other HEENT Symptoms/Conditions: difficulty with vision.  patient states her glasses are broken and hasn't had an eye appointment in a few years.  patient states she will call to scheduled follow up with eye doctor. HEENT Conditions: Vision problem(s) HEENT Management Strategies: Routine screening Vision problem(s)  Cardiovascular Cardiovascular Symptoms Reported: No symptoms reported Does patient have uncontrolled Hypertension?: No Weight: 240 lb (108.9 kg)  Respiratory Respiratory Symptoms Reported: Shortness of breath Respiratory Conditions: COPD Respiratory Self-Management Outcome: 4 (good)  Endocrine Patient reports the following symptoms related to hypoglycemia or hyperglycemia : No symptoms reported Is patient diabetic?: No    Gastrointestinal Gastrointestinal Symptoms Reported: Nausea, Constipation Additional Gastrointestinal Details: patient states she contribues her nausea and constipation to being on Wegovy .  States this is manageable. Gastrointestinal Conditions: Nausea, Constipation Gastrointestinal Management Strategies: Medication therapy (routine follow up with provider.)    Genitourinary Genitourinary Symptoms Reported: Incontinence Genitourinary Conditions: Incontinence Genitourinary Management Strategies: Incontinence garment/pad  Integumentary Integumentary Symptoms Reported: No symptoms reported    Musculoskeletal Musculoskelatal Symptoms Reviewed: Weakness, Muscle pain Musculoskeletal Conditions: Osteoporosis, Osteoarthritis, Back pain Musculoskeletal Management Strategies: Routine screening, Medical  device Musculoskeletal Comment: patient reports having osteonecrosis in right hip.  She reports having LEFT hip replacement in 09/2022. Patient reports chronic back pain.  She reports ongoing follow up with orthopedic doctor 3 months ago.  She reports next follow up visit is this month. Falls in the past year?: Yes Number of falls in past year: 2 or more Was there an injury with Fall?: No Fall Risk Category Calculator: 2 Patient Fall Risk Level: Moderate Fall Risk Patient at Risk for Falls Due to: History of fall(s), Impaired mobility Fall risk Follow up: Falls prevention discussed  Psychosocial Psychosocial Symptoms Reported: Depression - if selected complete PHQ 2-9, Anxiety - if selected complete GAD Behavioral Health Conditions: Bipolar affective disorder, Depression, Anxiety, ADHD/ADD, Post traumatic stress Behavioral Management Strategies: Medication therapy, Counseling (patient reports follow up with psychiarist and counselor regularly.) Major Change/Loss/Stressor/Fears (CP): Denies Quality of Family Relationships: supportive Do you feel physically threatened by others?: No      07/29/2023    2:54 PM  Depression screen PHQ 2/9  Decreased Interest 3  Down, Depressed, Hopeless 2  PHQ - 2 Score 5  Altered sleeping 1  Tired, decreased energy 2  Change in appetite 3  Feeling bad or failure about yourself  3  Trouble concentrating 3  Moving slowly or fidgety/restless 2  Suicidal thoughts 0  PHQ-9 Score 19  Difficult doing work/chores Somewhat difficult    There were no vitals filed for this visit.  Medications Reviewed Today     Reviewed by Jozsef Wescoat E, RN (Registered Nurse) on 07/29/23 at 1430  Med List Status: <None>   Medication Order Taking? Sig Documenting Provider Last Dose Status Informant  ALPRAZolam  (XANAX ) 0.5 MG tablet [id number] Yes Take 1 tablet (0.5 mg total) by mouth 2 (two) times daily. Verla Glaze, MD Taking Active Self  celecoxib (CELEBREX) 200 MG  capsule [id number] No Take 200 mg by mouth daily.  Patient not taking: Reported on 07/29/2023   [provider] Not Taking Active   divalproex  (DEPAKOTE ) 250 MG DR tablet [id number] Yes Take 1 tablet (250 mg total) by mouth 2 (two) times daily. Hamp Levine, MD Taking Active Self  escitalopram  (LEXAPRO ) 20 MG tablet [id number] Yes Take 20 mg by mouth daily. [provider] Taking Active Self  fluconazole  (DIFLUCAN ) 150 MG tablet [id number] Yes Take 1 tablet (150 mg total) by mouth daily. For 7 day then do every 3 days if symptoms persist (rash/yeast infection) Tapia, [name], PA-C Taking Active   Lurasidone  HCl (LATUDA ) 60 MG TABS [id number] Yes Take 1 tablet (60 mg total) by mouth daily with breakfast. Verla Glaze, MD Taking Active Self  miconazole  (MICOTIN) 2 % powder [id number] Yes Apply topically 2 (two) times daily as needed for itching (Candidal intertrigo). Tapia, [name], PA-C Taking Active   mometasone -formoterol  (DULERA ) 100-5 MCG/ACT AERO [id number]  Inhale 2 puffs into the lungs 2 (two) times daily. [place], Eric J, DO  Expired 05/12/23 2359            Med Note Marrie Sizer, Lonzo Saulter E   Mon Jul 29, 2023  2:29 PM) Patient states she is still taking.   Multiple Vitamins-Minerals (MULTIVITAMIN WITH MINERALS) tablet [id number] Yes Take 1 tablet by mouth daily. [provider] Taking Active Self  naloxone  (NARCAN ) nasal spray 4 mg/0.1 mL [id number] Yes Spray into nostril with signs of opioid related oversedation or overdose Earnie Gola, PA-C Taking Active   nystatin  (MYCOSTATIN /NYSTOP ) powder [id number] Yes Apply 1 Application topically 3 (three) times daily as needed (yeast/fungal rashes or intertrigo). Tapia, [name], PA-C Taking Active  pregabalin  (LYRICA ) 50 MG capsule [id number] No Take 1 capsule (50 mg total) by mouth 2 (two) times daily.  Patient not taking: Reported on 07/29/2023   Tapia, [name], PA-C Not Taking Active   rOPINIRole  (REQUIP  XL) 4 MG 24 hr tablet [id number] Yes  Take 4 mg by mouth at bedtime. [provider] Taking Active Self  Semaglutide -Weight Management 0.25 MG/0.5ML SOAJ [id number] Yes Inject 0.25 mg into the skin once a week for 28 days. Tapia, [name], PA-C Taking Active   Semaglutide -Weight Management 0.5 MG/0.5ML SOAJ [id number] No Inject 0.5 mg into the skin once a week for 28 days.  Patient not taking: Reported on 07/29/2023   Tapia, [name], PA-C Not Taking Active   Semaglutide -Weight Management 1 MG/0.5ML SOAJ [id number]  Inject 1 mg into the skin once a week. Tapia, [name], PA-C  Active   tiZANidine  (ZANAFLEX ) 2 MG tablet [id number] Yes Take 2 mg by mouth every 8 (eight) hours as needed. [provider] Taking Active   umeclidinium bromide  (INCRUSE ELLIPTA ) 62.5 MCG/ACT AEPB [id number] Yes Inhale 1 puff into the lungs daily. [place], Eric J, DO Taking Active             Recommendation:   PCP Follow-up  Follow Up Plan:   Telephone follow-up in 1 month.  Patient is transferring to Michele Ahle, RN case manager for ongoing case management follow up.  Patient agreeable to transfer and ongoing follow up.    Verba Girt RN, BSN, CCM [employer], Population Health Case Manager Phone: [phone]

## 2023-07-29 NOTE — Patient Instructions (Signed)
 Visit Information  Thank you for taking time to visit with me today. Please don't hesitate to contact me if I can be of assistance to you before our next scheduled appointment.  Our next appointment is by telephone on 08/28/23 at 2 pm Please call the care guide team at [phone] if you need to cancel or reschedule your appointment.   Following is a copy of your care plan:   Goals Addressed             This Visit's Progress    COMPLETED: Effective management of chronic medical conditions       Interventions Today    Flowsheet Row Most Recent Value  Chronic Disease   Chronic disease during today's visit Chronic Obstructive Pulmonary Disease (COPD), Other  [healing cellulitis and chronic left leg pain]  General Interventions   General Interventions Discussed/Reviewed Health Screening, General Interventions Reviewed, Communication with  [Discussed referral for home health for cellulitis, advised of inability to secure services, she understands and is ok with not having since cellulitis is better]  Health Screening Colonoscopy  [canceled recently scheduled colonoscopy, requesting to have cologaurd done instead due to trouble securing person to take her for appointment]  Communication with PCP/Specialists  [PCP notified of inability to secure [facility] services.  Also inquired about home cologuard kit]  Exercise Interventions   Exercise Discussed/Reviewed Physical Activity  Physical Activity Discussed/Reviewed Physical Activity Reviewed  [Report having some pain in her leg with walking, advised to use walker to increase stability and decrease fall]           VBCI RN Care Plan- COPD       Problems:  Chronic Disease Management support and education needs related to COPD  Goal: Over the next 3 months the Patient will attend all scheduled medical appointments: with providers as evidenced by patient report/ chart review.         continue to work with Medical illustrator and/or Social Worker to  address care management and care coordination needs related to COPD as evidenced by adherence to care management team scheduled appointments     demonstrate Ongoing adherence to prescribed treatment plan for COPD as evidenced by patient report /chart review.  take all medications exactly as prescribed and will call provider for medication related questions as evidenced by patient report/ chart review.      Interventions:   COPD Interventions: Advised patient to track and manage COPD triggers Advised patient to self assesses COPD action plan zone and make appointment with provider if in the yellow zone for 48 hours without improvement Assessed social determinant of health barriers Use of home oxygen  Completed PHQ2-9 and GAD 7 Advised patient to notify provider of increase in anxiety symptoms  Advised to purchase a pulse oximetry and  monitor oxygen  level daily.  Advised to call and schedule eye appointment Education article on COPD mailed to patient   Patient Self-Care Activities:  Attend all scheduled provider appointments Call pharmacy for medication refills 3-7 days in advance of running out of medications Call provider office for new concerns or questions  Take medications as prescribed   listen for public air quality announcements every day follow rescue plan if symptoms flare-up keep follow-up appointments: with providers as recommended.  Notify provider of increase in anxiety symptoms  Purchase a pulse oximetry and  monitor oxygen  level daily.  Call and schedule eye appointment Review COPD article once received. Notify your case manager if you have any questions/ concerns.   Plan:  Telephone follow up appointment with care management team member scheduled for:  08/28/23 at 2 pm             Please call the Suicide and Crisis Lifeline: 988 call 1-800-273-TALK (toll free, 24 hour hotline) if you are experiencing a Mental Health or Behavioral Health Crisis or need someone  to talk to.  The patient verbalized understanding of instructions, educational materials, and care plan provided today and agreed to receive a mailed copy of patient instructions, educational materials, and care plan.   Lanny Lipkin RN, BSN, CCM [place]  [facility], Population Health Case Manager Phone: [phone]  Living with COPD Being diagnosed with chronic obstructive pulmonary disease (COPD) changes your life physically and emotionally. Having COPD can affect your ability to work and do things you enjoy. COPD is not the same for everyone, and it may change over time. Your health care providers can help you come up with the COPD management plan that works best for you. How to manage lifestyle changes Treatment plan Work closely with your health care providers. Follow your COPD management plan. This plan includes: Instructions about activities, exercises, diet, medicines, what to do when COPD flares up, and when to call your health care provider. A pulmonary rehabilitation program. In pulmonary rehab, you will learn about COPD, do exercises for fitness and breathing, and get support from health care providers and other people who have COPD. Managing emotions and stress Living with a chronic disease means you may also struggle with stressful emotions, such as sadness, fear, and worry. Here are some ways to manage these emotions: Talk to someone about your fear, anxiety, depression, or stress. Learn strategies to avoid or reduce stress and ask for help if you are struggling with depression or anxiety. Consider joining a COPD support group, online or in person.  Adjusting to changes COPD may limit the things you can do, but you can make certain changes to help you cope with the diagnosis. Ask for help when you need it. Getting support from friends, family, and your health care team is an important part of managing the condition. Try to get regular exercise as  prescribed by a health care provider or pulmonary rehab team. Exercising can help COPD, even if you are a bit short of breath. Take steps to prevent infection and protect your lungs: Wash your hands often and avoid being in crowds. Stay away from friends and family members who are sick. Check your local air quality each day, and stay out of areas where air pollution is likely. How to recognize changes in your condition Recognizing changes in your COPD COPD is a progressive disease. It is important to let the health care team know if your COPD is getting worse. Your treatment plan may need to change. Watch for: Increased shortness of breath, wheezing, cough, or fatigue. Loss of ability to exercise or perform daily activities, like climbing stairs. More frequent symptom flares. Signs of depression or anxiety. Recognizing stress It is normal to have additional stress when you have COPD. However, prolonged stress and anxiety can make COPD worse and lead to depression. Recognize the warning signs, which include: Feeling sad or worried more often or most of the time. Having less energy and losing interest in pleasurable activities. Changes in your appetite or sleeping patterns. Being easily angered or irritated. Having unexplained aches and pains, digestive problems, or headaches. Follow these instructions at home: Eating and drinking  Eat foods that are high in  fiber, such as fresh fruits and vegetables, whole grains, and beans. Limit foods that are high in fat and processed sugars, such as fried or sweet foods. Follow a balanced diet and maintain a healthy weight. Being overweight or underweight can make COPD worse. You may work with a Data processing manager as part of your pulmonary rehab program. Drink enough fluid to keep your urine pale yellow. If you drink alcohol: Limit how much you have to: 0-1 drink a day for women who are not pregnant. 0-2 drinks a day for men. Know how much alcohol is in your  drink. In the [place] one drink equals one 12 oz bottle of beer (355 mL), one 5 oz glass of wine (148 mL), or one 1 oz glass of hard liquor (44 mL). Lifestyle If you smoke, the most important thing that you can do is to stop smoking. Continuing to smoke will cause the disease to progress faster. Do not use any products that contain nicotine  or tobacco. These products include cigarettes, chewing tobacco, and vaping devices, such as e-cigarettes. If you need help quitting, ask your health care provider. Avoid exposure to things that irritate your lungs, such as smoke, chemicals, and fumes. Activity Balance exercise and rest. Take short walks every 1-2 hours. This is important to improve blood flow and breathing. Ask for help if you feel weak or unsteady. Do exercises that include controlled breathing with body movement, such as tai chi. General instructions Take over-the-counter and prescription medicines only as told by your health care provider. Take vitamin and protein supplements as told by your health care provider or dietitian. Practice good oral hygiene and see your dental care provider regularly. An oral infection can also spread to your lungs. Make sure you receive all the vaccines that your health care provider recommends. Keep all follow-up visits. This is important. Contact a health care provider if you: Are struggling to manage your COPD. Have emotional stress that interferes with your ability to cope with COPD. Get help right away if you: Have thoughts of suicide, death, or hurting yourself or others. If you ever feel like you may hurt yourself or others, or have thoughts about taking your own life, get help right away. Go to your nearest emergency department or: Call your local emergency services (911 in the [place] Call a suicide crisis helpline, such as the National Suicide Prevention Lifeline at (442)730-2189 or 988 in the [place] This is open 24 hours a day in the [place] If you're a  Veteran: Call 988 and press 1. This is open 24 hours a day. Text the [employer] at 902-639-0860. Summary Being diagnosed with chronic obstructive pulmonary disease (COPD) changes your life physically and emotionally. Work with your health care providers and follow your COPD management plan. A pulmonary rehabilitation program is an important part of COPD management. Prolonged stress, anxiety, and depression can make COPD worse. Let your health care provider know if emotional stress interferes with your ability to cope with and manage COPD. This information is not intended to replace advice given to you by your health care provider. Make sure you discuss any questions you have with your health care provider. Document Revised: 09/27/2022 Document Reviewed: 03/02/2020 Elsevier Patient Education  2024 [employer].

## 2023-07-30 ENCOUNTER — Ambulatory Visit: Admitting: Podiatry

## 2023-08-05 ENCOUNTER — Ambulatory Visit: Admitting: Internal Medicine

## 2023-08-05 DIAGNOSIS — J441 Chronic obstructive pulmonary disease with (acute) exacerbation: Secondary | ICD-10-CM | POA: Diagnosis not present

## 2023-08-05 DIAGNOSIS — J449 Chronic obstructive pulmonary disease, unspecified: Secondary | ICD-10-CM | POA: Diagnosis not present

## 2023-08-06 ENCOUNTER — Encounter: Payer: Self-pay | Admitting: Family Medicine

## 2023-08-06 ENCOUNTER — Ambulatory Visit: Admitting: Family Medicine

## 2023-08-06 VITALS — BP 124/68 | HR 76 | Resp 16 | Ht 61.0 in | Wt 233.0 lb

## 2023-08-06 DIAGNOSIS — Z5181 Encounter for therapeutic drug level monitoring: Secondary | ICD-10-CM

## 2023-08-06 DIAGNOSIS — D509 Iron deficiency anemia, unspecified: Secondary | ICD-10-CM | POA: Diagnosis not present

## 2023-08-06 DIAGNOSIS — M791 Myalgia, unspecified site: Secondary | ICD-10-CM | POA: Diagnosis not present

## 2023-08-06 DIAGNOSIS — E538 Deficiency of other specified B group vitamins: Secondary | ICD-10-CM | POA: Diagnosis not present

## 2023-08-06 DIAGNOSIS — E66813 Obesity, class 3: Secondary | ICD-10-CM

## 2023-08-06 DIAGNOSIS — R253 Fasciculation: Secondary | ICD-10-CM | POA: Diagnosis not present

## 2023-08-06 DIAGNOSIS — R202 Paresthesia of skin: Secondary | ICD-10-CM

## 2023-08-06 DIAGNOSIS — G609 Hereditary and idiopathic neuropathy, unspecified: Secondary | ICD-10-CM

## 2023-08-06 DIAGNOSIS — Z8739 Personal history of other diseases of the musculoskeletal system and connective tissue: Secondary | ICD-10-CM

## 2023-08-06 DIAGNOSIS — R252 Cramp and spasm: Secondary | ICD-10-CM

## 2023-08-06 DIAGNOSIS — J441 Chronic obstructive pulmonary disease with (acute) exacerbation: Secondary | ICD-10-CM

## 2023-08-06 DIAGNOSIS — E222 Syndrome of inappropriate secretion of antidiuretic hormone: Secondary | ICD-10-CM

## 2023-08-06 DIAGNOSIS — N3946 Mixed incontinence: Secondary | ICD-10-CM

## 2023-08-06 DIAGNOSIS — R7303 Prediabetes: Secondary | ICD-10-CM

## 2023-08-06 DIAGNOSIS — F322 Major depressive disorder, single episode, severe without psychotic features: Secondary | ICD-10-CM

## 2023-08-06 MED ORDER — MAGNESIUM OXIDE 400 MG PO TABS: 400.0000 mg | ORAL_TABLET | Freq: Every day | ORAL | 1 refills | Status: DC

## 2023-08-06 MED ORDER — PREDNISONE 20 MG PO TABS: ORAL_TABLET | ORAL | 0 refills | Status: DC

## 2023-08-06 MED ORDER — ALBUTEROL SULFATE HFA 108 (90 BASE) MCG/ACT IN AERS: 2.0000 | INHALATION_SPRAY | Freq: Four times a day (QID) | RESPIRATORY_TRACT | 0 refills | Status: DC | PRN

## 2023-08-06 NOTE — Progress Notes (Unsigned)
 Patient ID: Nicole Austin, female    DOB: 1960-07-29, 63 y.o.   MRN: [id number]  PCP: Adeline Hone, PA-C  Chief Complaint  Patient presents with   Foot Pain    Severe cramping in both feet and ankles/calves. X2 months- excruciating pain made me scream    Subjective:   Nicole Austin is a 63 y.o. female, presents to clinic with CC of the following:  HPI  Muscle cramps/spasms severe to legs/feet, hard cramping/charlie horse waking her from sleep sore the next day also spasms and tingling with pain Hx of multiple deficiencies and hyponatremia   Lab Results  Component Value Date   VITAMINB12 281 10/04/2017   Lab Results  Component Value Date   FERRITIN 26 07/08/2022   Hemoglobin  Date Value Ref Range Status  02/11/2023 11.8 (L) 12.0 - 15.0 g/dL Final  40/98/1191 47.8 (L) 12.0 - 15.0 g/dL Final  29/56/2130 86.5 (L) 12.0 - 15.0 g/dL Final  78/46/9629 52.8 12.0 - 15.0 g/dL Final   HGB  Date Value Ref Range Status  06/15/2014 10.9 (L) 12.0 - 16.0 g/dL Final  41/32/4401 02.7 12.0 - 16.0 g/dL Final  25/36/6440 34.7 12.0 - 16.0 g/dL Final  42/59/5638 75.6 (L) 12.0 - 16.0 g/dL Final   Lab Results  Component Value Date   HGBA1C 5.9 (H) 02/09/2023   Lab Results  Component Value Date   CHOL 117 12/07/2019   HDL 31 (L) 12/07/2019   LDLCALC 58 12/07/2019   TRIG 216 (H) 12/07/2019   CHOLHDL 3.8 12/07/2019    Also wheezy and SOB with running AC and coughing more  Mixed incontinence getting really bad over the past couple months - she's had no work up for this.  Last culture confirmed UTI was about year, she does have hx of intertrigo/yeast infections.  Since I have been her PCP she has rarely mentioned urinary sx or incontinence - but states its really bad x 3 months and she's peeing all over herself         Patient Active Problem List   Diagnosis Date Noted   Bipolar affective disorder, current episode mixed (HCC) 03/15/2023   COPD exacerbation  (HCC) 02/08/2023   COVID-19 virus infection 02/08/2023   S/P total left hip arthroplasty 10/09/2022   SIADH (syndrome of inappropriate ADH production) (HCC) 07/09/2022   Iron deficiency anemia 07/07/2022   History of methicillin resistant staphylococcus aureus (MRSA) 05/03/2022   Avascular necrosis of bones of both hips (HCC) 05/03/2022   Chronic prescription benzodiazepine use 05/03/2022   Polypharmacy 04/09/2022   Sinus bradycardia 04/09/2022   Depression with anxiety 04/09/2022   MRSA (methicillin resistant staph aureus) culture positive 05/04/2021   Prolonged QT interval 01/01/2021   Bipolar disorder with depression (HCC) 02/25/2017   ADD (attention deficit disorder) 02/25/2017   Marijuana use 02/13/2017   Prediabetes 05/20/2016   Neuropathy, peripheral 05/07/2016   Vitamin B12 deficiency 10/24/2015   Fibromyalgia 08/30/2015   OP (osteoporosis) 06/30/2015   Sleep apnea 06/30/2015   HLD (hyperlipidemia) 05/13/2015   Class 3 severe obesity with serious comorbidity and body mass index (BMI) of 45.0 to 49.9 in adult 08/16/2014   Obesity, Class III, BMI 40-49.9 (morbid obesity) 08/16/2014   Low back pain with sciatica 08/04/2014   Tobacco abuse 08/04/2014   Anxiety, generalized 11/24/2013   COPD (chronic obstructive pulmonary disease) (HCC) 11/18/2013   Osteoarthritis of knee, unspecified 07/15/2013      Current Outpatient Medications:    ALPRAZolam  (XANAX ) 0.5  MG tablet, Take 1 tablet (0.5 mg total) by mouth 2 (two) times daily., Disp: 10 tablet, Rfl: 0   divalproex  (DEPAKOTE ) 250 MG DR tablet, Take 1 tablet (250 mg total) by mouth 2 (two) times daily., Disp: 60 tablet, Rfl: 2   escitalopram  (LEXAPRO ) 20 MG tablet, Take 20 mg by mouth daily., Disp: , Rfl:    fluconazole  (DIFLUCAN ) 150 MG tablet, Take 1 tablet (150 mg total) by mouth daily. For 7 day then do every 3 days if symptoms persist (rash/yeast infection), Disp: 20 tablet, Rfl: 0   Lurasidone  HCl (LATUDA ) 60 MG TABS,  Take 1 tablet (60 mg total) by mouth daily with breakfast., Disp: 30 tablet, Rfl: 0   miconazole  (MICOTIN) 2 % powder, Apply topically 2 (two) times daily as needed for itching (Candidal intertrigo)., Disp: 85 g, Rfl: 2   Multiple Vitamins-Minerals (MULTIVITAMIN WITH MINERALS) tablet, Take 1 tablet by mouth daily., Disp: , Rfl:    naloxone  (NARCAN ) nasal spray 4 mg/0.1 mL, Spray into nostril with signs of opioid related oversedation or overdose, Disp: 1 each, Rfl: 0   nystatin  (MYCOSTATIN /NYSTOP ) powder, Apply 1 Application topically 3 (three) times daily as needed (yeast/fungal rashes or intertrigo)., Disp: 60 g, Rfl: 2   rOPINIRole  (REQUIP  XL) 4 MG 24 hr tablet, Take 4 mg by mouth at bedtime., Disp: , Rfl:    [START ON 08/30/2023] Semaglutide -Weight Management 1 MG/0.5ML SOAJ, Inject 1 mg into the skin once a week. (Patient taking differently: Inject 5 mg into the skin once a week.), Disp: 2 mL, Rfl: 2   tiZANidine  (ZANAFLEX ) 2 MG tablet, Take 2 mg by mouth every 8 (eight) hours as needed., Disp: , Rfl:    umeclidinium bromide  (INCRUSE ELLIPTA ) 62.5 MCG/ACT AEPB, Inhale 1 puff into the lungs daily., Disp: 90 each, Rfl: 0   celecoxib (CELEBREX) 200 MG capsule, Take 200 mg by mouth daily. (Patient not taking: Reported on 07/29/2023), Disp: , Rfl:    mometasone -formoterol  (DULERA ) 100-5 MCG/ACT AERO, Inhale 2 puffs into the lungs 2 (two) times daily., Disp: 3 each, Rfl: 0   pregabalin  (LYRICA ) 50 MG capsule, Take 1 capsule (50 mg total) by mouth 2 (two) times daily. (Patient not taking: Reported on 05/14/2023), Disp: 180 capsule, Rfl: 0   Semaglutide -Weight Management 0.5 MG/0.5ML SOAJ, Inject 0.5 mg into the skin once a week for 28 days. (Patient not taking: Reported on 07/29/2023), Disp: 2 mL, Rfl: 0   Allergies  Allergen Reactions   Chantix  [Varenicline ] Nausea Only   Glycopyrrolate  Rash    Oral irritation     Social History   Tobacco Use   Smoking status: Former    Current packs/day: 0.00     Average packs/day: 0.5 packs/day for 36.0 years (18.0 ttl pk-yrs)    Types: Cigarettes    Start date: 03/13/1986    Quit date: 03/13/2022    Years since quitting: 1.4    Passive exposure: Past   Smokeless tobacco: Never   Tobacco comments:    Pt using nicotine  patches; 1/2 PPD as of 03/21/21  Vaping Use   Vaping status: Some Days   Substances: Nicotine , Flavoring  Substance Use Topics   Alcohol use: No    Alcohol/week: 0.0 standard drinks of alcohol   Drug use: Yes    Types: Marijuana    Comment: occasionally      Chart Review Today: I personally reviewed active problem list, medication list, allergies, family history, social history, health maintenance, notes from last encounter, lab results, imaging  with the patient/caregiver today.   Review of Systems  Constitutional: Negative.   HENT: Negative.    Eyes: Negative.   Respiratory: Negative.    Cardiovascular: Negative.   Gastrointestinal: Negative.   Endocrine: Negative.   Genitourinary: Negative.   Musculoskeletal: Negative.   Skin: Negative.   Allergic/Immunologic: Negative.   Neurological: Negative.   Hematological: Negative.   Psychiatric/Behavioral: Negative.    All other systems reviewed and are negative.      Objective:   Vitals:   08/06/23 1353  BP: 124/68  Pulse: 76  Resp: 16  SpO2: 98%  Weight: 233 lb (105.7 kg)  Height: 5' 1 (1.549 m)    Body mass index is 44.02 kg/m.  Physical Exam Vitals and nursing note reviewed.  Constitutional:      General: She is not in acute distress.    Appearance: Normal appearance. She is well-developed. She is obese. She is not ill-appearing, toxic-appearing or diaphoretic.  HENT:     Head: Normocephalic and atraumatic.     Right Ear: External ear normal.     Left Ear: External ear normal.     Nose: Nose normal.  Eyes:     General: No scleral icterus.       Right eye: No discharge.        Left eye: No discharge.     Conjunctiva/sclera: Conjunctivae normal.   Neck:     Trachea: No tracheal deviation.  Cardiovascular:     Rate and Rhythm: Normal rate.     Pulses: Normal pulses.     Heart sounds: Normal heart sounds.  Pulmonary:     Effort: Pulmonary effort is normal. No respiratory distress.     Breath sounds: No stridor. Wheezing present.  Musculoskeletal:     Comments: Lower legs, ankles feet examined, no current fasciculations or spasms, left lower leg muscle compartments soft but minimally tender on exam No LE edema BL No wounds/sores/rash Skin dry bilaterally PT pulses 2+ bilaterally   Skin:    General: Skin is warm and dry.     Findings: No rash.  Neurological:     Mental Status: She is alert.     Motor: No abnormal muscle tone.     Coordination: Coordination normal.     Gait: Gait normal.  Psychiatric:        Mood and Affect: Mood normal.        Behavior: Behavior normal.      Results for orders placed or performed in visit on 04/22/23  Cervicovaginal ancillary only   Collection Time: 04/22/23  3:45 PM  Result Value Ref Range   Neisseria Gonorrhea Negative    Chlamydia Negative    Trichomonas Negative    Bacterial Vaginitis (gardnerella) Negative    Candida Vaginitis Negative    Candida Glabrata Negative    Comment      Normal Reference Range Bacterial Vaginosis - Negative   Comment Normal Reference Ranger Chlamydia - Negative    Comment      Normal Reference Range Neisseria Gonorrhea - Negative   Comment Normal Reference Range Candida Species - Negative    Comment Normal Reference Range Candida Galbrata - Negative    Comment Normal Reference Range Trichomonas - Negative    *Note: Due to a large number of results and/or encounters for the requested time period, some results have not been displayed. A complete set of results can be found in Results Review.       Assessment & Plan:  1. Muscle cramps (Primary) Charlie horses, spasms, cramps x several days, waking from sleep causing some muscle soreness, no  current visualized spasms Will check for electrolyte abnormality (hx of) trial mag supplement and she can still use muscle relaxers if this helps CK check due to some muscle soreness and hx of rhabdo - Magnesium  - Comprehensive metabolic panel with GFR - CK (Creatine Kinase)  2. Fasciculation of lower extremity - Magnesium  - Comprehensive metabolic panel with GFR - Iron, TIBC and Ferritin Panel - Vitamin B12 - T4, free - TSH - CK (Creatine Kinase)  3. Paresthesia of both lower extremities Neuropathy and paresthesias - check deficiencies - Magnesium  - CBC with Differential/Platelet - Comprehensive metabolic panel with GFR - Iron, TIBC and Ferritin Panel - Vitamin B12 - T4, free - TSH - CK (Creatine Kinase)  4. Vitamin B12 deficiency Hx of not on supplement - CBC with Differential/Platelet - Vitamin B12   5. Idiopathic peripheral neuropathy - CBC with Differential/Platelet - Comprehensive metabolic panel with GFR - Iron, TIBC and Ferritin Panel - Vitamin B12 - T4, free - TSH  6. Iron deficiency anemia, unspecified iron deficiency anemia type Recheck blood counts and iron panel, not currently on supplements - CBC with Differential/Platelet - Iron, TIBC and Ferritin Panel  7. SIADH (syndrome of inappropriate ADH production) (HCC) Recheck electrolytes - Comprehensive metabolic panel with GFR  8. Myalgia - CK (Creatine Kinase)  9. History of rhabdomyolysis - CK (Creatine Kinase)  10. Prediabetes Recheck a1c - Hemoglobin A1c  11. Obesity, Class III, BMI 40-49.9 (morbid obesity) Weight down a few lbs Wt Readings from Last 5 Encounters:  08/06/23 233 lb (105.7 kg)  07/29/23 240 lb (108.9 kg)  07/03/23 235 lb (106.6 kg)  04/22/23 246 lb (111.6 kg)  03/27/23 240 lb (108.9 kg)   BMI Readings from Last 5 Encounters:  08/06/23 44.02 kg/m  07/29/23 45.35 kg/m  07/03/23 44.40 kg/m  04/22/23 46.48 kg/m  03/27/23 45.35 kg/m    - Lipid panel  12.  Encounter for medication monitoring CMP, CBC, lipids a1c  13. Mixed incontinence urge and stress She asks what we can do about her peeing all over herself all the time which is new States its been going on for a long time but sig worse x 3 months, she seems to have both stress and urge incontinence, currently no dysuria - Ambulatory referral to Urogynecology  14. COPD exacerbation (HCC) Diffuse wheeze on exam - tx with prednisone  and inhalers, can use mucinex  - she did not come in for this but wheeze evident on exam, she hasn't had an exacerbation in quite some time.  Encouraged her to f/up if this doesn't get better in the next week  - predniSONE  (DELTASONE ) 20 MG tablet; 2 tabs poqday 1-3, 1 tabs poqday 4-6  Dispense: 9 tablet; Refill: 0 - albuterol  (VENTOLIN  HFA) 108 (90 Base) MCG/ACT inhaler; Inhale 2 puffs into the lungs every 6 (six) hours as needed for wheezing or shortness of breath.  Dispense: 8 g; Refill: 0  15.  MDD - per psychiatry on mulitple meds, hx of mood disorder Last PHQ 9 was positive - on lexapro  latuda  xanax  (chronic benzo)   Refill for zanaflex  after reviewing meds and last ECG which showed no Qtc prolongation   Return for keep next f/up appt in July unless sx not improving.     Adeline Hone, PA-C 08/06/23 2:04 PM

## 2023-08-07 ENCOUNTER — Ambulatory Visit: Payer: Self-pay | Admitting: Family Medicine

## 2023-08-07 ENCOUNTER — Encounter: Payer: Self-pay | Admitting: Family Medicine

## 2023-08-07 DIAGNOSIS — E538 Deficiency of other specified B group vitamins: Secondary | ICD-10-CM

## 2023-08-07 DIAGNOSIS — E611 Iron deficiency: Secondary | ICD-10-CM

## 2023-08-07 DIAGNOSIS — J449 Chronic obstructive pulmonary disease, unspecified: Secondary | ICD-10-CM | POA: Diagnosis not present

## 2023-08-07 DIAGNOSIS — F322 Major depressive disorder, single episode, severe without psychotic features: Secondary | ICD-10-CM | POA: Insufficient documentation

## 2023-08-07 DIAGNOSIS — J441 Chronic obstructive pulmonary disease with (acute) exacerbation: Secondary | ICD-10-CM | POA: Diagnosis not present

## 2023-08-07 LAB — COMPREHENSIVE METABOLIC PANEL WITH GFR
AG Ratio: 1.3 (calc) (ref 1.0–2.5)
ALT: 11 U/L (ref 6–29)
AST: 12 U/L (ref 10–35)
Albumin: 3.7 g/dL (ref 3.6–5.1)
Alkaline phosphatase (APISO): 75 U/L (ref 37–153)
BUN: 14 mg/dL (ref 7–25)
CO2: 34 mmol/L — ABNORMAL HIGH (ref 20–32)
Calcium: 9.3 mg/dL (ref 8.6–10.4)
Chloride: 95 mmol/L — ABNORMAL LOW (ref 98–110)
Creat: 0.96 mg/dL (ref 0.50–1.05)
Globulin: 2.9 g/dL (ref 1.9–3.7)
Glucose, Bld: 68 mg/dL (ref 65–99)
Potassium: 4.8 mmol/L (ref 3.5–5.3)
Sodium: 133 mmol/L — ABNORMAL LOW (ref 135–146)
Total Bilirubin: 0.3 mg/dL (ref 0.2–1.2)
Total Protein: 6.6 g/dL (ref 6.1–8.1)
eGFR: 66 mL/min/{1.73_m2} (ref >=60)

## 2023-08-07 LAB — CBC WITH DIFFERENTIAL/PLATELET
Absolute Lymphocytes: 2601 {cells}/uL (ref 850–3900)
Absolute Monocytes: 720 {cells}/uL (ref 200–950)
Basophils Absolute: 54 {cells}/uL (ref 0–200)
Basophils Relative: 0.6 %
Eosinophils Absolute: 333 {cells}/uL (ref 15–500)
Eosinophils Relative: 3.7 %
HCT: 40.8 % (ref 35.0–45.0)
Hemoglobin: 12.9 g/dL (ref 11.7–15.5)
MCH: 28 pg (ref 27.0–33.0)
MCHC: 31.6 g/dL — ABNORMAL LOW (ref 32.0–36.0)
MCV: 88.5 fL (ref 80.0–100.0)
MPV: 10.1 fL (ref 7.5–12.5)
Monocytes Relative: 8 %
Neutro Abs: 5292 {cells}/uL (ref 1500–7800)
Neutrophils Relative %: 58.8 %
Platelets: 302 10*3/uL (ref 140–400)
RBC: 4.61 10*6/uL (ref 3.80–5.10)
RDW: 15.9 % — ABNORMAL HIGH (ref 11.0–15.0)
Total Lymphocyte: 28.9 %
WBC: 9 10*3/uL (ref 3.8–10.8)

## 2023-08-07 LAB — HEMOGLOBIN A1C
Hgb A1c MFr Bld: 5.9 % — ABNORMAL HIGH (ref <5.7)
Mean Plasma Glucose: 123 mg/dL
eAG (mmol/L): 6.8 mmol/L

## 2023-08-07 LAB — TSH: TSH: 0.86 m[IU]/L (ref 0.40–4.50)

## 2023-08-07 LAB — LIPID PANEL
Cholesterol: 160 mg/dL (ref <200)
HDL: 48 mg/dL — ABNORMAL LOW (ref >=50)
LDL Cholesterol (Calc): 92 mg/dL
Non-HDL Cholesterol (Calc): 112 mg/dL (ref <130)
Total CHOL/HDL Ratio: 3.3 (calc) (ref <5.0)
Triglycerides: 107 mg/dL (ref <150)

## 2023-08-07 LAB — IRON,TIBC AND FERRITIN PANEL
%SAT: 11 % — ABNORMAL LOW (ref 16–45)
Ferritin: 16 ng/mL (ref 16–288)
Iron: 41 ug/dL — ABNORMAL LOW (ref 45–160)
TIBC: 372 ug/dL (ref 250–450)

## 2023-08-07 LAB — MAGNESIUM: Magnesium: 1.6 mg/dL (ref 1.5–2.5)

## 2023-08-07 LAB — T4, FREE: Free T4: 1 ng/dL (ref 0.8–1.8)

## 2023-08-07 LAB — CK: Total CK: 120 U/L (ref 20–243)

## 2023-08-07 LAB — VITAMIN B12: Vitamin B-12: 233 pg/mL (ref 200–1100)

## 2023-08-07 MED ORDER — VITAMIN B-12 1000 MCG PO TABS: 1000.0000 ug | ORAL_TABLET | Freq: Every day | ORAL | 1 refills | Status: DC

## 2023-08-07 MED ORDER — IRON (FERROUS SULFATE) 325 (65 FE) MG PO TABS: 325.0000 mg | ORAL_TABLET | ORAL | 1 refills | Status: DC

## 2023-08-07 MED ORDER — TIZANIDINE HCL 2 MG PO TABS: 2.0000 mg | ORAL_TABLET | Freq: Three times a day (TID) | ORAL | 1 refills | Status: DC | PRN

## 2023-08-08 DIAGNOSIS — G2581 Restless legs syndrome: Secondary | ICD-10-CM | POA: Diagnosis not present

## 2023-08-08 DIAGNOSIS — G250 Essential tremor: Secondary | ICD-10-CM | POA: Diagnosis not present

## 2023-08-08 DIAGNOSIS — M539 Dorsopathy, unspecified: Secondary | ICD-10-CM | POA: Diagnosis not present

## 2023-08-15 ENCOUNTER — Telehealth: Payer: Self-pay | Admitting: Family Medicine

## 2023-08-15 NOTE — Telephone Encounter (Unsigned)
 Copied from CRM [id number]. Topic: Referral - Status >> Aug 15, 2023  9:20 AM Nicole Austin wrote: Reason for CRM:  Patient called back requesting an update on referral for urology. Per patient she would like to know if it was approved and assistance with scheduling an appointment.   May you please assist.

## 2023-08-19 ENCOUNTER — Telehealth: Payer: Self-pay | Admitting: Family Medicine

## 2023-08-19 NOTE — Telephone Encounter (Signed)
 Urology referral placed 08/06/23. They called and lvm to schedule.

## 2023-08-19 NOTE — Telephone Encounter (Signed)
 Copied from CRM [id number]. Topic: Referral - Request for Referral >> Aug 19, 2023  2:55 PM Silvana PARAS wrote: Did the patient discuss referral with their provider in the last year? No (If No - schedule appointment) (If Yes - send message)  Appointment offered? Yes  Type of order/referral and detailed reason for visit: Urologist due to incontinence  Preference of office, provider, location: [place] or [place], or [place]   If referral order, have you been seen by this specialty before? No (If Yes, this issue or another issue? When? Where?  Can we respond through MyChart? Yes

## 2023-08-20 ENCOUNTER — Ambulatory Visit: Payer: Self-pay

## 2023-08-20 NOTE — Progress Notes (Deleted)
 LMP  (LMP Unknown)    Subjective:    Patient ID: Nicole Austin, female    DOB: 15-Aug-1960, 63 y.o.   MRN: [id number]  HPI: Nicole Austin is a 63 y.o. female  No chief complaint on file.   Discussed the use of AI scribe software for clinical note transcription with the patient, who gave verbal consent to proceed.  History of Present Illness          07/29/2023    2:54 PM 03/21/2023   11:01 AM 02/01/2023    2:14 PM  Depression screen PHQ 2/9  Decreased Interest 3 2 3   Down, Depressed, Hopeless 2 2 0  PHQ - 2 Score 5 4 3   Altered sleeping 1 0 2  Tired, decreased energy 2 2 2   Change in appetite 3 0 0  Feeling bad or failure about yourself  3 0 0  Trouble concentrating 3 0 0  Moving slowly or fidgety/restless 2 0 0  Suicidal thoughts 0 0 0  PHQ-9 Score 19 6 7   Difficult doing work/chores Somewhat difficult      Relevant past medical, surgical, family and social history reviewed and updated as indicated. Interim medical history since our last visit reviewed. Allergies and medications reviewed and updated.  Review of Systems  Per HPI unless specifically indicated above     Objective:     LMP  (LMP Unknown)   {Vitals History (Optional):23777} Wt Readings from Last 3 Encounters:  08/06/23 233 lb (105.7 kg)  07/29/23 240 lb (108.9 kg)  07/03/23 235 lb (106.6 kg)    Physical Exam Physical Exam    Results for orders placed or performed in visit on 08/06/23  Magnesium    Collection Time: 08/06/23  2:24 PM  Result Value Ref Range   Magnesium  1.6 1.5 - 2.5 mg/dL  CBC with Differential/Platelet   Collection Time: 08/06/23  2:24 PM  Result Value Ref Range   WBC 9.0 3.8 - 10.8 Thousand/uL   RBC 4.61 3.80 - 5.10 Million/uL   Hemoglobin 12.9 11.7 - 15.5 g/dL   HCT 59.1 64.9 - 54.9 %   MCV 88.5 80.0 - 100.0 fL   MCH 28.0 27.0 - 33.0 pg   MCHC 31.6 (L) 32.0 - 36.0 g/dL   RDW 84.0 (H) 88.9 - 84.9 %   Platelets 302 140 - 400 Thousand/uL   MPV 10.1 7.5  - 12.5 fL   Neutro Abs 5,292 1,500 - 7,800 cells/uL   Absolute Lymphocytes 2,601 850 - 3,900 cells/uL   Absolute Monocytes 720 200 - 950 cells/uL   Eosinophils Absolute 333 15 - 500 cells/uL   Basophils Absolute 54 0 - 200 cells/uL   Neutrophils Relative % 58.8 %   Total Lymphocyte 28.9 %   Monocytes Relative 8.0 %   Eosinophils Relative 3.7 %   Basophils Relative 0.6 %  Comprehensive metabolic panel with GFR   Collection Time: 08/06/23  2:24 PM  Result Value Ref Range   Glucose, Bld 68 65 - 99 mg/dL   BUN 14 7 - 25 mg/dL   Creat 9.03 9.49 - 8.94 mg/dL   eGFR 66 > OR = 60 fO/fpw/8.26f7   BUN/Creatinine Ratio SEE NOTE: 6 - 22 (calc)   Sodium 133 (L) 135 - 146 mmol/L   Potassium 4.8 3.5 - 5.3 mmol/L   Chloride 95 (L) 98 - 110 mmol/L   CO2 34 (H) 20 - 32 mmol/L   Calcium  9.3 8.6 - 10.4 mg/dL   Total  Protein 6.6 6.1 - 8.1 g/dL   Albumin 3.7 3.6 - 5.1 g/dL   Globulin 2.9 1.9 - 3.7 g/dL (calc)   AG Ratio 1.3 1.0 - 2.5 (calc)   Total Bilirubin 0.3 0.2 - 1.2 mg/dL   Alkaline phosphatase (APISO) 75 37 - 153 U/L   AST 12 10 - 35 U/L   ALT 11 6 - 29 U/L  Iron , TIBC and Ferritin Panel   Collection Time: 08/06/23  2:24 PM  Result Value Ref Range   Iron  41 (L) 45 - 160 mcg/dL   TIBC 627 749 - 549 mcg/dL (calc)   %SAT 11 (L) 16 - 45 % (calc)   Ferritin 16 16 - 288 ng/mL  Vitamin B12   Collection Time: 08/06/23  2:24 PM  Result Value Ref Range   Vitamin B-12 233 200 - 1,100 pg/mL  T4, free   Collection Time: 08/06/23  2:24 PM  Result Value Ref Range   Free T4 1.0 0.8 - 1.8 ng/dL  TSH   Collection Time: 08/06/23  2:24 PM  Result Value Ref Range   TSH 0.86 0.40 - 4.50 mIU/L  CK (Creatine Kinase)   Collection Time: 08/06/23  2:24 PM  Result Value Ref Range   Total CK 120 20 - 243 U/L  Hemoglobin A1c   Collection Time: 08/06/23  2:24 PM  Result Value Ref Range   Hgb A1c MFr Bld 5.9 (H) <5.7 %   Mean Plasma Glucose 123 mg/dL   eAG (mmol/L) 6.8 mmol/L  Lipid panel   Collection  Time: 08/06/23  2:24 PM  Result Value Ref Range   Cholesterol 160 <200 mg/dL   HDL 48 (L) > OR = 50 mg/dL   Triglycerides 892 <849 mg/dL   LDL Cholesterol (Calc) 92 mg/dL (calc)   Total CHOL/HDL Ratio 3.3 <5.0 (calc)   Non-HDL Cholesterol (Calc) 112 <130 mg/dL (calc)   *Note: Due to a large number of results and/or encounters for the requested time period, some results have not been displayed. A complete set of results can be found in Results Review.   {Labs (Optional):23779}       Assessment & Plan:   Problem List Items Addressed This Visit   None    Assessment and Plan Assessment & Plan         Follow up plan: No follow-ups on file.

## 2023-08-20 NOTE — Telephone Encounter (Signed)
 FYI Only or Action Required?: FYI only for provider.  Patient was last seen in primary care on 08/03/2022 by Nicole Chiquita HERO, NP. Called Nurse Triage reporting Back Pain. Symptoms began several days ago. Interventions attempted: Nothing. Symptoms are: unchanged.  Triage Disposition: See HCP Within 4 Hours (Or PCP Triage)  Patient/caregiver understands and will follow disposition?: Yes      Copied from CRM [id number]. Topic: Clinical - Red Word Triage >> Aug 20, 2023  8:13 AM [name] wrote: Red Word that prompted transfer to Nurse Triage: The patient states that she is experiencing back pain on the lower left side. She states it started 2 days ago. Reason for Disposition  [1] SEVERE back pain (e.g., excruciating, unable to do any normal activities) AND [2] not improved 2 hours after pain medicine  Answer Assessment - Initial Assessment Questions 1. ONSET: When did the pain begin?      Couple days ago 2. LOCATION: Where does it hurt? (upper, mid or lower back)     Lower back on the left side 3. SEVERITY: How bad is the pain?  (e.g., Scale 1-10; mild, moderate, or severe)   - MILD (1-3): Doesn't interfere with normal activities.    - MODERATE (4-7): Interferes with normal activities or awakens from sleep.    - SEVERE (8-10): Excruciating pain, unable to do any normal activities.      severe 4. PATTERN: Is the pain constant? (e.g., yes, no; constant, intermittent)      constant 5. RADIATION: Does the pain shoot into your legs or somewhere else?     denies 6. CAUSE:  What do you think is causing the back pain?      no 7. BACK OVERUSE:  Any recent lifting of heavy objects, strenuous work or exercise?     no 8. MEDICINES: What have you taken so far for the pain? (e.g., nothing, acetaminophen , NSAIDS)     denies 9. NEUROLOGIC SYMPTOMS: Do you have any weakness, numbness, or problems with bowel/bladder control?     constipation 10. OTHER SYMPTOMS: Do you have any other  symptoms? (e.g., fever, abdomen pain, burning with urination, blood in urine)       no 11. PREGNANCY: Is there any chance you are pregnant? When was your last menstrual period?       na  Protocols used: Back Pain-A-AH

## 2023-08-21 ENCOUNTER — Ambulatory Visit: Admitting: Nurse Practitioner

## 2023-08-22 DIAGNOSIS — Z961 Presence of intraocular lens: Secondary | ICD-10-CM | POA: Diagnosis not present

## 2023-08-22 DIAGNOSIS — H26493 Other secondary cataract, bilateral: Secondary | ICD-10-CM | POA: Diagnosis not present

## 2023-08-22 DIAGNOSIS — H43813 Vitreous degeneration, bilateral: Secondary | ICD-10-CM | POA: Diagnosis not present

## 2023-08-26 ENCOUNTER — Ambulatory Visit: Payer: Self-pay

## 2023-08-26 NOTE — Telephone Encounter (Signed)
  FYI Only or Action Required?: FYI only for provider.  Patient was last seen in primary care on 08/03/2022 by Nicole Chiquita HERO, NP. Called Nurse Triage reporting Back Pain. Symptoms began several weeks ago. Interventions attempted: OTC medications: ibuprofen , icy hot, Prescription medications: tylenol  #3, and Rest, hydration, or home remedies. Symptoms are: unchanged.  Triage Disposition: See PCP When Office is Open (Within 3 Days)  Patient/caregiver understands and will follow disposition?:    Copied from CRM [id number]. Topic: Clinical - Red Word Triage >> Aug 26, 2023 11:48 AM Powell Austin wrote: Red Word that prompted transfer to Nurse Triage: Pain on her left side, was seen for it, but is getting worse, pain is near her kidney area. Having a lot of pain happen and wanting to be seen.   ----------------------------------------------------------------------- From previous Reason for Contact - Scheduling: Patient/patient representative is calling to schedule an appointment. Refer to attachments for appointment information. Reason for Disposition  [1] Pain radiates into the thigh or further down the leg AND [2] one leg  Answer Assessment - Initial Assessment Questions 1. ONSET: When did the pain begin?      Two weeks ago 2. LOCATION: Where does it hurt? (upper, mid or lower back)     Left low back 3. SEVERITY: How bad is the pain?  (e.g., Scale 1-10; mild, moderate, or severe)   - MILD (1-3): Doesn't interfere with normal activities.    - MODERATE (4-7): Interferes with normal activities or awakens from sleep.    - SEVERE (8-10): Excruciating pain, unable to do any normal activities.      9/10 4. PATTERN: Is the pain constant? (e.g., yes, no; constant, intermittent)      constant 5. RADIATION: Does the pain shoot into your legs or somewhere else?     groin 6. CAUSE:  What do you think is causing the back pain?      unknown 8. MEDICINES: What have you taken so far for the  pain? (e.g., nothing, acetaminophen , NSAIDS)     Icy hot, heating pad, ice, tylenol  #3 10. OTHER SYMPTOMS: Do you have any other symptoms? (e.g., fever, abdomen pain, burning with urination, blood in urine)       denies  Protocols used: Back Pain-A-AH

## 2023-08-27 ENCOUNTER — Ambulatory Visit (INDEPENDENT_AMBULATORY_CARE_PROVIDER_SITE_OTHER): Admitting: Podiatry

## 2023-08-27 ENCOUNTER — Encounter: Payer: Self-pay | Admitting: Podiatry

## 2023-08-27 ENCOUNTER — Ambulatory Visit (INDEPENDENT_AMBULATORY_CARE_PROVIDER_SITE_OTHER)

## 2023-08-27 VITALS — Ht 61.0 in | Wt 233.0 lb

## 2023-08-27 DIAGNOSIS — M7751 Other enthesopathy of right foot: Secondary | ICD-10-CM | POA: Diagnosis not present

## 2023-08-27 DIAGNOSIS — M79675 Pain in left toe(s): Secondary | ICD-10-CM

## 2023-08-27 DIAGNOSIS — B351 Tinea unguium: Secondary | ICD-10-CM

## 2023-08-27 DIAGNOSIS — M19071 Primary osteoarthritis, right ankle and foot: Secondary | ICD-10-CM

## 2023-08-27 DIAGNOSIS — M79674 Pain in right toe(s): Secondary | ICD-10-CM | POA: Diagnosis not present

## 2023-08-27 NOTE — Progress Notes (Signed)
 No chief complaint on file.   SUBJECTIVE Patient presents to office today for routine footcare.  Also experiencing intermittent right foot tenderness depending on activity.  No history of injury.  Past Medical History:  Diagnosis Date   ADHD (attention deficit hyperactivity disorder)    Apnea, sleep 06/30/2015   Arthritis    Asthma    Asthma with acute exacerbation 06/30/2015   Benign neoplasm of ascending colon    Bipolar 1 disorder (HCC)    Chronic pain    COPD, moderate (HCC) 11/18/2013   Depression    DOE (dyspnea on exertion)    Fall 07/04/2022   GERD (gastroesophageal reflux disease) 08/30/2015   Gout 08/16/2014   History of acute myocardial infarction 06/30/2015   History of panic attacks    HPV (human papilloma virus) infection 07/17/2013   Hyperlipidemia    Left knee pain 07/06/2022   Low HDL (under 40) 08/30/2015   MI (myocardial infarction) (HCC) 2018   MRSA infection 2023   groin abscess   Nose colonized with MRSA 03/21/2022   a.) PCR (+) prior to LEFT THA   Osteoporosis    Parkinson disease (HCC)    Polyp of sigmoid colon    Pre-diabetes    Prediabetes 05/20/2016   PTSD (post-traumatic stress disorder)    Restless leg syndrome    Rhabdomyolysis    Sleep apnea 06/30/2015   Stage 3 severe COPD by GOLD classification (HCC) 11/18/2013   Stroke (HCC) 2018   right arm weakness   Traumatic rhabdomyolysis (HCC) 07/04/2022   Tremors of nervous system    hands   Uncomplicated asthma 06/30/2015   Varicella 02/25/2017    Allergies  Allergen Reactions   Chantix  [Varenicline ] Nausea Only   Glycopyrrolate  Rash    Oral irritation     OBJECTIVE General Patient is awake, alert, and oriented x 3 and in no acute distress. Derm Skin is dry and supple bilateral. Negative open lesions or macerations. Remaining integument unremarkable. Nails are tender, long, thickened and dystrophic with subungual debris, consistent with onychomycosis, 1-5 bilateral. No signs  of infection noted. Vasc  DP and PT pedal pulses palpable bilaterally. Temperature gradient within normal limits.  Neuro Epicritic and protective threshold sensation grossly intact bilaterally.  Musculoskeletal Exam No symptomatic pedal deformities noted bilateral. Muscular strength within normal limits.  There is some tenderness diffusely throughout palpation of the right foot DG Foot Complete Left 12/18/2022 IMPRESSION: Acute nondisplaced intra-articular fracture at the medial base of the proximal phalanx of the great toe.  Radiographic exam RT foot 08/27/2023 normal osseous mineralization.  Moderate degenerative changes noted throughout the pedal joints of the right foot.  No acute fractures identified  ASSESSMENT 1.  Pain due to onychomycosis of toenails both -Patient evaluated today.  -Instructed to maintain good pedal hygiene and foot care.  -Mechanical debridement of nails 1-5 bilaterally performed using a nail nipper. Filed with dremel without incident.  -Return to clinic 3 months routine footcare  2.  General arthritis right foot - X-rays reviewed -Recommend OTC Motrin  PRN -Continue wearing good supportive tennis shoes and sneakers   Thresa EMERSON Sar, DPM [facility]  Dr. Thresa EMERSON Sar, DPM    [address], [place] [address]  Office [phone]  Fax [phone]

## 2023-08-28 ENCOUNTER — Other Ambulatory Visit: Payer: Self-pay | Admitting: Family Medicine

## 2023-08-28 ENCOUNTER — Other Ambulatory Visit: Payer: Self-pay

## 2023-08-28 ENCOUNTER — Telehealth

## 2023-08-28 ENCOUNTER — Ambulatory Visit (INDEPENDENT_AMBULATORY_CARE_PROVIDER_SITE_OTHER): Admitting: Family Medicine

## 2023-08-28 ENCOUNTER — Encounter: Payer: Self-pay | Admitting: Family Medicine

## 2023-08-28 VITALS — BP 124/72 | HR 79 | Resp 16 | Ht 61.0 in | Wt 235.0 lb

## 2023-08-28 DIAGNOSIS — M5442 Lumbago with sciatica, left side: Secondary | ICD-10-CM

## 2023-08-28 DIAGNOSIS — M25552 Pain in left hip: Secondary | ICD-10-CM

## 2023-08-28 DIAGNOSIS — J441 Chronic obstructive pulmonary disease with (acute) exacerbation: Secondary | ICD-10-CM

## 2023-08-28 MED ORDER — TIZANIDINE HCL 2 MG PO TABS: 2.0000 mg | ORAL_TABLET | Freq: Three times a day (TID) | ORAL | 1 refills | Status: DC | PRN

## 2023-08-28 MED ORDER — LIDOCAINE 5 % EX PTCH: 1.0000 | MEDICATED_PATCH | Freq: Every day | CUTANEOUS | 0 refills | Status: DC | PRN

## 2023-08-28 MED ORDER — PREDNISONE 20 MG PO TABS: ORAL_TABLET | ORAL | 0 refills | Status: DC

## 2023-08-28 NOTE — Patient Instructions (Signed)
 Call [facility] in [place] and see if you can be seen sooner by Dr. Ernie or their Urgent care for your left low back and left hip   Ask [facility] pharmacy about the zanaflex  medicine cause I prescribed it on 6/11 and refilled it today and you haven't gotten it.

## 2023-08-28 NOTE — Patient Outreach (Signed)
 Complex Care Management   Visit Note  08/28/2023  Name:  Nicole Austin MRN: [id number] DOB: 1960-04-16  Situation: Referral received for Complex Care Management related to COPD I obtained verbal consent from Patient.  Visit completed with Patient  on the phone  Background:   Past Medical History:  Diagnosis Date   ADHD (attention deficit hyperactivity disorder)    Apnea, sleep 06/30/2015   Arthritis    Asthma    Asthma with acute exacerbation 06/30/2015   Benign neoplasm of ascending colon    Bipolar 1 disorder (HCC)    Chronic pain    COPD, moderate (HCC) 11/18/2013   Depression    DOE (dyspnea on exertion)    Fall 07/04/2022   GERD (gastroesophageal reflux disease) 08/30/2015   Gout 08/16/2014   History of acute myocardial infarction 06/30/2015   History of panic attacks    HPV (human papilloma virus) infection 07/17/2013   Hyperlipidemia    Left knee pain 07/06/2022   Low HDL (under 40) 08/30/2015   MI (myocardial infarction) (HCC) 2018   MRSA infection 2023   groin abscess   Nose colonized with MRSA 03/21/2022   a.) PCR (+) prior to LEFT THA   Osteoporosis    Parkinson disease (HCC)    Polyp of sigmoid colon    Pre-diabetes    Prediabetes 05/20/2016   PTSD (post-traumatic stress disorder)    Restless leg syndrome    Rhabdomyolysis    Sleep apnea 06/30/2015   Stage 3 severe COPD by GOLD classification (HCC) 11/18/2013   Stroke (HCC) 2018   right arm weakness   Traumatic rhabdomyolysis (HCC) 07/04/2022   Tremors of nervous system    hands   Uncomplicated asthma 06/30/2015   Varicella 02/25/2017    Assessment: Patient Reported Symptoms:  Cognitive Cognitive Status: Alert and oriented to person, place, and time, Insightful and able to interpret abstract concepts, Normal speech and language skills   Health Maintenance Behaviors: Annual physical exam, Sleep adequate, Healthy diet, Social activities Healing Pattern: Average Health Facilitated by:  Healthy diet, Pain control, Rest  Neurological Neurological Review of Symptoms: Other: [place] Neurological Symptoms/Conditions [RPT]: tremors - sees neuro, Primidone started - has noticed improvement Neurological Management Strategies: Medication therapy Neurological Self-Management Outcome: 4 (good)  HEENT HEENT Symptoms Reported: No symptoms reported HEENT Management Strategies: Routine screening HEENT Self-Management Outcome: 4 (good) HEENT Comment: new glasses    Cardiovascular Cardiovascular Symptoms Reported: No symptoms reported Does patient have uncontrolled Hypertension?: No    Respiratory Respiratory Symptoms Reported: Shortness of breath Respiratory Management Strategies: Adequate rest, Medication therapy, Routine screening Respiratory Self-Management Outcome: 3 (uncertain)  Endocrine Endocrine Symptoms Reported: No symptoms reported Is patient diabetic?: No Endocrine Self-Management Outcome: 4 (good)  Gastrointestinal Gastrointestinal Symptoms Reported: No symptoms reported Additional Gastrointestinal Details: Constipated at times - Metamucil prn and increases fluids Gastrointestinal Management Strategies: Medication therapy, Fluid modification    Genitourinary Genitourinary Symptoms Reported: Incontinence Genitourinary Management Strategies: Incontinence garment/pad Genitourinary Comment: Uro-Gyn referral placed  Integumentary Integumentary Symptoms Reported: No symptoms reported Additional Integumentary Details: prior rash resolved    Musculoskeletal Musculoskelatal Symptoms Reviewed: Back pain, Muscle pain, Joint pain, Limited mobility, Unsteady gait Additional Musculoskeletal Details: back and L hip pain affecting gail - ortho 09/12/23 Musculoskeletal Management Strategies: Adequate rest, Medication therapy, Routine screening, Weight management Musculoskeletal Self-Management Outcome: 3 (uncertain) Falls in the past year?: Yes Number of falls in past year: 2 or  more Was there an injury with Fall?: No Fall Risk Category Calculator: 2  Patient Fall Risk Level: Moderate Fall Risk Patient at Risk for Falls Due to: History of fall(s), Impaired mobility, Impaired balance/gait, Orthopedic patient Fall risk Follow up: Falls evaluation completed, Falls prevention discussed  Psychosocial Psychosocial Symptoms Reported: Anxiety - if selected complete GAD Additional Psychological Details: Anxiety every day meds helping Behavioral Management Strategies: Medication therapy, Support system, Counseling (Therapist weekly for 1 hour) Behavioral Health Self-Management Outcome: 4 (good) Major Change/Loss/Stressor/Fears (CP): Denies Techniques to Cope with Loss/Stress/Change: Counseling, Medication Quality of Family Relationships: supportive Do you feel physically threatened by others?: No      08/28/2023    4:40 PM  Depression screen PHQ 2/9  Decreased Interest 2  Down, Depressed, Hopeless 1  PHQ - 2 Score 3  Altered sleeping 0  Tired, decreased energy 2  Change in appetite 0  Feeling bad or failure about yourself  0  Trouble concentrating 0  Moving slowly or fidgety/restless 0  Suicidal thoughts 0  PHQ-9 Score 5  Difficult doing work/chores Somewhat difficult    There were no vitals filed for this visit.  Medications Reviewed Today     Reviewed by Devra Lands, RN (Registered Nurse) on 08/28/23 at 1623  Med List Status: <None>   Medication Order Taking? Sig Documenting Provider Last Dose Status Informant  albuterol  (VENTOLIN  HFA) 108 (90 Base) MCG/ACT inhaler [id number] Yes Inhale 2 puffs into the lungs every 6 (six) hours as needed for wheezing or shortness of breath. Tapia, [name], PA-C  Active   ALPRAZolam  (XANAX ) 0.5 MG tablet [id number] Yes Take 1 tablet (0.5 mg total) by mouth 2 (two) times daily. Josette Ade, MD  Active Self  cyanocobalamin  (VITAMIN B12) 1000 MCG tablet [id number] Yes Take 1 tablet (1,000 mcg total) by mouth daily. Tapia,  [name], PA-C  Active   divalproex  (DEPAKOTE ) 250 MG DR tablet [id number] Yes Take 1 tablet (250 mg total) by mouth 2 (two) times daily.  Patient taking differently: Take 250 mg by mouth daily. Patient reports now just daily at bedtime and Latuda  increased by psychiatrist   Mohammed Goldstein, MD  Active Self  escitalopram  (LEXAPRO ) 20 MG tablet [id number] Yes Take 20 mg by mouth daily. [provider]  Active Self  Iron , Ferrous Sulfate , 325 (65 Fe) MG TABS [id number] Yes Take 325 mg by mouth every other day. Leavy Mole, PA-C  Active   lidocaine  (LIDODERM ) 5 % [id number] Yes Place 1 patch onto the skin daily as needed. Apply to affected area of pain, left low back/SI joint area Leavy Mole, PA-C  Active   Lurasidone  HCl (LATUDA ) 60 MG TABS [id number] Yes Take 1 tablet (60 mg total) by mouth daily with breakfast.  Patient taking differently: Take 80 mg by mouth daily. Patient reported taking latuda  80 mg daily at bedtime per Psychiatrist Emmie Erie Josette Ade, MD  Active Self  magnesium  oxide (MAG-OX) 400 MG tablet [id number] Yes Take 1 tablet (400 mg total) by mouth daily. Tapia, [name], PA-C  Active   miconazole  (MICOTIN) 2 % powder [id number] Yes Apply topically 2 (two) times daily as needed for itching (Candidal intertrigo). Tapia, [name], PA-C  Active   mometasone -formoterol  (DULERA ) 100-5 MCG/ACT AERO [id number] Yes Inhale 2 puffs into the lungs 2 (two) times daily. [place], Camellia PARAS, DO  Active            Med Note NORVEL KIRSCH   Tue Aug 06, 2023  1:53 PM)    Multiple Vitamins-Minerals (MULTIVITAMIN WITH MINERALS) tablet [id number] Yes Take 1 tablet by mouth  daily. [provider]  Active Self  naloxone  (NARCAN ) nasal spray 4 mg/0.1 mL [id number] Yes Spray into nostril with signs of opioid related oversedation or overdose Patti Rosina SAUNDERS, PA-C  Active   nystatin  (MYCOSTATIN /NYSTOP ) powder [id number] Yes Apply 1 Application topically 3 (three) times daily as needed (yeast/fungal  rashes or intertrigo). Tapia, [name], PA-C  Active   predniSONE  (DELTASONE ) 20 MG tablet [id number] Yes 2 tabs poqday 1-3, 1 tabs poqday 4-6 Tapia, [name], PA-C  Active   pregabalin  (LYRICA ) 50 MG capsule [id number]  Take 1 capsule (50 mg total) by mouth 2 (two) times daily.  Patient not taking: Reported on 08/28/2023   Tapia, [name], PA-C  Active   rOPINIRole  (REQUIP  XL) 4 MG 24 hr tablet [id number] Yes Take 4 mg by mouth at bedtime. [provider]  Active Self  Semaglutide -Weight Management 1 MG/0.5ML SOAJ [id number] Yes Inject 1 mg into the skin once a week. Tapia, [name], PA-C  Active   tiZANidine  (ZANAFLEX ) 2 MG tablet [id number] Yes Take 1-2 tablets (2-4 mg total) by mouth every 8 (eight) hours as needed for muscle spasms. Tapia, [name], PA-C  Active   umeclidinium bromide  (INCRUSE ELLIPTA ) 62.5 MCG/ACT AEPB [id number] Yes Inhale 1 puff into the lungs daily. [place], Camellia PARAS, DO  Active             Recommendation:   PCP Follow-up Continue Current Plan of Care  Follow Up Plan:   Telephone follow-up in 1 month  Nestora Duos, MSN, RN [facility]  [facility], [facility] RN Care Manager Direct Dial: [phone] Fax: [phone]

## 2023-08-28 NOTE — Patient Instructions (Signed)
 Visit Information  Thank you for taking time to visit with me today. Please don't hesitate to contact me if I can be of assistance to you before our next scheduled appointment.  Your next care management appointment is by telephone on 09/25/23 at 2:00 pm  Telephone follow-up in 1 month  Please call the care guide team at [phone] if you need to cancel, schedule, or reschedule an appointment.   Please call the Suicide and Crisis Lifeline: 988 call the [place]  National Suicide Prevention Lifeline: (470)764-3525 or TTY: 5742976520 TTY 782-512-6246) to talk to a trained counselor call 1-800-273-TALK (toll free, 24 hour hotline) go to [facility] [address] [place] [phone]) call 911 if you are experiencing a Mental Health or Behavioral Health Crisis or need someone to talk to.  Nestora Duos, MSN, RN [facility], [facility] RN Care Manager Direct Dial: [phone] Fax: [phone]

## 2023-08-28 NOTE — Progress Notes (Signed)
 Patient ID: Nicole Austin, female    DOB: 09-23-1960, 63 y.o.   MRN: [id number]  PCP: Leavy Mole, PA-C  Chief Complaint  Patient presents with   Back Pain    L side, x2 weeks. Feels like somebody has punched me from the inside Radiating into L hip. Feels pressure w/sitting    Subjective:   Nicole Austin is a 63 y.o. female, presents to clinic with CC of the following:  HPI  Patient presents with left low back pain radiating around her left hip that began about 2 weeks ago after she was packing moving and cleaning things doing more physical activity than her normal.  Pain has not improved over the past 2 weeks she has tried ice and heat, she has not tried her muscle relaxers which have been recently refilled and prescribed for her. She has no radiation of pain down her left leg, it is worse with sitting down or changing positions, she is trying to walk No saddle anesthesia or incontinence of stool or urine No falls or recent injuries  Patient Active Problem List   Diagnosis Date Noted   Moderately severe major depression (HCC) 08/07/2023   Bipolar affective disorder, current episode mixed (HCC) 03/15/2023   COPD exacerbation (HCC) 02/08/2023   COVID-19 virus infection 02/08/2023   S/P total left hip arthroplasty 10/09/2022   SIADH (syndrome of inappropriate ADH production) (HCC) 07/09/2022   Iron  deficiency anemia 07/07/2022   History of methicillin resistant staphylococcus aureus (MRSA) 05/03/2022   Avascular necrosis of bones of both hips (HCC) 05/03/2022   Chronic prescription benzodiazepine use 05/03/2022   Polypharmacy 04/09/2022   Sinus bradycardia 04/09/2022   Depression with anxiety 04/09/2022   MRSA (methicillin resistant staph aureus) culture positive 05/04/2021   Prolonged QT interval 01/01/2021   Bipolar disorder with depression (HCC) 02/25/2017   ADD (attention deficit disorder) 02/25/2017   Marijuana use 02/13/2017   Prediabetes 05/20/2016    Neuropathy, peripheral 05/07/2016   Vitamin B12 deficiency 10/24/2015   Fibromyalgia 08/30/2015   OP (osteoporosis) 06/30/2015   Sleep apnea 06/30/2015   HLD (hyperlipidemia) 05/13/2015   Class 3 severe obesity with serious comorbidity and body mass index (BMI) of 45.0 to 49.9 in adult 08/16/2014   Obesity, Class III, BMI 40-49.9 (morbid obesity) 08/16/2014   Low back pain with sciatica 08/04/2014   Tobacco abuse 08/04/2014   Anxiety, generalized 11/24/2013   COPD (chronic obstructive pulmonary disease) (HCC) 11/18/2013   Osteoarthritis of knee, unspecified 07/15/2013      Current Outpatient Medications:    albuterol  (VENTOLIN  HFA) 108 (90 Base) MCG/ACT inhaler, Inhale 2 puffs into the lungs every 6 (six) hours as needed for wheezing or shortness of breath., Disp: 8 g, Rfl: 0   ALPRAZolam  (XANAX ) 0.5 MG tablet, Take 1 tablet (0.5 mg total) by mouth 2 (two) times daily., Disp: 10 tablet, Rfl: 0   cyanocobalamin  (VITAMIN B12) 1000 MCG tablet, Take 1 tablet (1,000 mcg total) by mouth daily., Disp: 90 tablet, Rfl: 1   divalproex  (DEPAKOTE ) 250 MG DR tablet, Take 1 tablet (250 mg total) by mouth 2 (two) times daily., Disp: 60 tablet, Rfl: 2   escitalopram  (LEXAPRO ) 20 MG tablet, Take 20 mg by mouth daily., Disp: , Rfl:    Iron , Ferrous Sulfate , 325 (65 Fe) MG TABS, Take 325 mg by mouth every other day., Disp: 45 tablet, Rfl: 1   Lurasidone  HCl (LATUDA ) 60 MG TABS, Take 1 tablet (60 mg total) by mouth daily with  breakfast., Disp: 30 tablet, Rfl: 0   magnesium  oxide (MAG-OX) 400 MG tablet, Take 1 tablet (400 mg total) by mouth daily., Disp: 30 tablet, Rfl: 1   miconazole  (MICOTIN) 2 % powder, Apply topically 2 (two) times daily as needed for itching (Candidal intertrigo)., Disp: 85 g, Rfl: 2   mometasone -formoterol  (DULERA ) 100-5 MCG/ACT AERO, Inhale 2 puffs into the lungs 2 (two) times daily., Disp: 3 each, Rfl: 0   Multiple Vitamins-Minerals (MULTIVITAMIN WITH MINERALS) tablet, Take 1 tablet  by mouth daily., Disp: , Rfl:    naloxone  (NARCAN ) nasal spray 4 mg/0.1 mL, Spray into nostril with signs of opioid related oversedation or overdose, Disp: 1 each, Rfl: 0   nystatin  (MYCOSTATIN /NYSTOP ) powder, Apply 1 Application topically 3 (three) times daily as needed (yeast/fungal rashes or intertrigo)., Disp: 60 g, Rfl: 2   predniSONE  (DELTASONE ) 20 MG tablet, 2 tabs poqday 1-3, 1 tabs poqday 4-6, Disp: 9 tablet, Rfl: 0   rOPINIRole  (REQUIP  XL) 4 MG 24 hr tablet, Take 4 mg by mouth at bedtime., Disp: , Rfl:    [START ON 08/30/2023] Semaglutide -Weight Management 1 MG/0.5ML SOAJ, Inject 1 mg into the skin once a week. (Patient taking differently: Inject 5 mg into the skin once a week.), Disp: 2 mL, Rfl: 2   tiZANidine  (ZANAFLEX ) 2 MG tablet, Take 1-2 tablets (2-4 mg total) by mouth every 8 (eight) hours as needed for muscle spasms., Disp: 30 tablet, Rfl: 1   umeclidinium bromide  (INCRUSE ELLIPTA ) 62.5 MCG/ACT AEPB, Inhale 1 puff into the lungs daily., Disp: 90 each, Rfl: 0   pregabalin  (LYRICA ) 50 MG capsule, Take 1 capsule (50 mg total) by mouth 2 (two) times daily. (Patient not taking: Reported on 08/28/2023), Disp: 180 capsule, Rfl: 0   Allergies  Allergen Reactions   Chantix  [Varenicline ] Nausea Only   Glycopyrrolate  Rash    Oral irritation     Social History   Tobacco Use   Smoking status: Former    Current packs/day: 0.00    Average packs/day: 0.5 packs/day for 36.0 years (18.0 ttl pk-yrs)    Types: Cigarettes    Start date: 03/13/1986    Quit date: 03/13/2022    Years since quitting: 1.4    Passive exposure: Past   Smokeless tobacco: Never   Tobacco comments:    Pt using nicotine  patches; 1/2 PPD as of 03/21/21  Vaping Use   Vaping status: Some Days   Substances: Nicotine , Flavoring  Substance Use Topics   Alcohol use: No    Alcohol/week: 0.0 standard drinks of alcohol   Drug use: Yes    Types: Marijuana    Comment: occasionally      Chart Review Today: I personally  reviewed active problem list, medication list, allergies, family history, social history, health maintenance, notes from last encounter, lab results, imaging with the patient/caregiver today.   Review of Systems  Constitutional: Negative.   HENT: Negative.    Eyes: Negative.   Respiratory: Negative.    Cardiovascular: Negative.   Gastrointestinal: Negative.   Endocrine: Negative.   Genitourinary: Negative.   Musculoskeletal: Negative.   Skin: Negative.   Allergic/Immunologic: Negative.   Neurological: Negative.   Hematological: Negative.   Psychiatric/Behavioral: Negative.    All other systems reviewed and are negative.      Objective:   Vitals:   08/28/23 1258  BP: 124/72  Pulse: 79  Resp: 16  SpO2: 96%  Weight: 235 lb (106.6 kg)  Height: 5' 1 (1.549 m)    Body  mass index is 44.4 kg/m.  Physical Exam Vitals and nursing note reviewed.  Constitutional:      General: She is not in acute distress.    Appearance: Normal appearance. She is well-developed. She is obese. She is not ill-appearing, toxic-appearing or diaphoretic.  HENT:     Head: Normocephalic and atraumatic.     Right Ear: External ear normal.     Left Ear: External ear normal.     Nose: Nose normal.  Eyes:     General: No scleral icterus.       Right eye: No discharge.        Left eye: No discharge.     Conjunctiva/sclera: Conjunctivae normal.  Neck:     Trachea: No tracheal deviation.  Cardiovascular:     Rate and Rhythm: Normal rate.  Pulmonary:     Effort: Pulmonary effort is normal. No respiratory distress.     Breath sounds: No stridor.  Musculoskeletal:     Left hip: No tenderness or bony tenderness. Normal range of motion.     Comments: Left low lumbar paraspinal muscle to left SI joint area ttp No midline tenderness from cervical to lumbar spine no step-off 5/5 symmetrical dorsiflexion and plantarflexion  Grossly normal sensation to light touch to b/l LE   Skin:    General: Skin is  warm and dry.     Findings: No rash.  Neurological:     Mental Status: She is alert.     Motor: No abnormal muscle tone.     Coordination: Coordination normal.     Gait: Gait abnormal (antalgic).  Psychiatric:        Mood and Affect: Mood normal.        Behavior: Behavior normal.           Assessment & Plan:   1. Left-sided low back pain with left-sided sciatica, unspecified chronicity (Primary) Left low back pain with radiates around left buttocks and left hip onset about 2 weeks ago after increased physical activity with packing cleaning and lifting, no discrete injury no falls Positive straight leg raise on the left concerning for sciatica versus lumbar radiculopathy do feel treatment with steroids is a appropriate she can also use over-the-counter medications such as Tylenol , use heat or ice, trial of lidocaine  Lidoderm  patches and refilled her muscle relaxer which she was urged to use with caution with other sedating medications Follow-up with her Ortho specialist at [facility] - predniSONE  (DELTASONE ) 20 MG tablet; 2 tabs poqday 1-3, 1 tabs poqday 4-6  Dispense: 9 tablet; Refill: 0 - tiZANidine  (ZANAFLEX ) 2 MG tablet; Take 1-2 tablets (2-4 mg total) by mouth every 8 (eight) hours as needed for muscle spasms.  Dispense: 30 tablet; Refill: 1 - lidocaine  (LIDODERM ) 5 %; Place 1 patch onto the skin daily as needed. Apply to affected area of pain, left low back/SI joint area  Dispense: 14 patch; Refill: 0 - Ambulatory referral to Orthopedic Surgery  2. Left hip pain Recent left hip replacement the pain is radiating around her left hip but she does have good movement of her left hip with flexion extension and fairly normal rotation do not suspect any pathology with her hip but did encourage her to follow-up with her orthopedic specialist - lidocaine  (LIDODERM ) 5 %; Place 1 patch onto the skin daily as needed. Apply to affected area of pain, left low back/SI joint area   Dispense: 14 patch; Refill: 0 - Ambulatory referral to Orthopedic Surgery   keep next  appt for routine f/up   Michelene Cower, PA-C 08/28/23 1:28 PM

## 2023-08-29 DIAGNOSIS — H5213 Myopia, bilateral: Secondary | ICD-10-CM | POA: Diagnosis not present

## 2023-08-30 NOTE — Telephone Encounter (Signed)
 Requested Prescriptions  Pending Prescriptions Disp Refills   albuterol  (VENTOLIN  HFA) 108 (90 Base) MCG/ACT inhaler [Pharmacy Med Name: ALBUTEROL  SULFATE HFA HFA AEROSOL SOLN] 8.5 g 3    Sig: INHALE TWO PUFFS INTO THE LUNGS EVERY SIX  HOURS AS NEEDED FOR WHEEZING OR SHORTNESS OF BREATH.     Pulmonology:  Beta Agonists 2 Passed - 08/30/2023 12:33 PM      Passed - Last BP in normal range    BP Readings from Last 1 Encounters:  08/28/23 124/72         Passed - Last Heart Rate in normal range    Pulse Readings from Last 1 Encounters:  08/28/23 79         Passed - Valid encounter within last 12 months    Recent Outpatient Visits           2 days ago Left-sided low back pain with left-sided sciatica, unspecified chronicity   [facility], [facility]   3 weeks ago Muscle cramps   [facility] Leavy Mole, [facility]   1 month ago Candidal intertrigo   [facility] Leavy Mole, [facility]-C   2 months ago Left shoulder pain, unspecified chronicity   [place] [facility] Leavy Mole, PA-C   4 months ago Vaginal burning   [facility] Leavy Mole, PA-C

## 2023-09-04 DIAGNOSIS — J449 Chronic obstructive pulmonary disease, unspecified: Secondary | ICD-10-CM | POA: Diagnosis not present

## 2023-09-04 DIAGNOSIS — J441 Chronic obstructive pulmonary disease with (acute) exacerbation: Secondary | ICD-10-CM | POA: Diagnosis not present

## 2023-09-06 ENCOUNTER — Telehealth (INDEPENDENT_AMBULATORY_CARE_PROVIDER_SITE_OTHER): Admitting: Family Medicine

## 2023-09-06 ENCOUNTER — Encounter: Payer: Self-pay | Admitting: Family Medicine

## 2023-09-06 DIAGNOSIS — J441 Chronic obstructive pulmonary disease with (acute) exacerbation: Secondary | ICD-10-CM | POA: Diagnosis not present

## 2023-09-06 DIAGNOSIS — N3946 Mixed incontinence: Secondary | ICD-10-CM

## 2023-09-06 DIAGNOSIS — J449 Chronic obstructive pulmonary disease, unspecified: Secondary | ICD-10-CM | POA: Diagnosis not present

## 2023-09-06 NOTE — Progress Notes (Signed)
 F/up on the referral and its open and pt given contact info  Mixed incontinence still a problem, never tried meds, no hematuria, no dysuria, just suprapubic pressure - feels like she has to urinate all the time, but urine flow is barely a trickle, and then throughout the day if she coughs and sneezes she leaks all day.   Consider myrbetriq, if not approved oxybutinyn - but concern with her multiple psych meds that she may not tolerate anticholinergic SE of oxybutinyn Do feel pt needs the specialists consult, likely bladder testing/PVR/bladder scan etc Pt going to call the Urogyn office today  Number given and I reached out to staff at their office after reviewing the referral notes  Michelene Cower, PA-C

## 2023-09-12 ENCOUNTER — Other Ambulatory Visit: Payer: Self-pay | Admitting: Family Medicine

## 2023-09-12 DIAGNOSIS — M25551 Pain in right hip: Secondary | ICD-10-CM | POA: Diagnosis not present

## 2023-09-12 DIAGNOSIS — M25552 Pain in left hip: Secondary | ICD-10-CM | POA: Diagnosis not present

## 2023-09-12 DIAGNOSIS — E611 Iron deficiency: Secondary | ICD-10-CM

## 2023-09-12 DIAGNOSIS — M51372 Other intervertebral disc degeneration, lumbosacral region with discogenic back pain and lower extremity pain: Secondary | ICD-10-CM | POA: Diagnosis not present

## 2023-09-12 DIAGNOSIS — M5459 Other low back pain: Secondary | ICD-10-CM | POA: Diagnosis not present

## 2023-09-13 NOTE — Telephone Encounter (Signed)
 Requested medication (s) are due for refill today: yes  Requested medication (s) are on the active medication list: yes  Last refill:  08/07/23  Future visit scheduled: no  Notes to clinic:  Unable to refill per protocol, Rx expired. Not on current list.      Requested Prescriptions  Pending Prescriptions Disp Refills   FEROSUL 325 (65 Fe) MG tablet [Pharmacy Med Name: FEROSUL 325MG  TABLET] 45 tablet 1    Sig: TAKE ONE TABLET ( 325 MG) BY MOUTH EVERY OTHER DAY.     Endocrinology:  Minerals - Iron  Supplementation Failed - 09/13/2023  3:31 PM      Failed - Fe (serum) in normal range and within 360 days    Iron   Date Value Ref Range Status  08/06/2023 41 (L) 45 - 160 mcg/dL Final   %SAT  Date Value Ref Range Status  08/06/2023 11 (L) 16 - 45 % (calc) Final         Passed - HGB in normal range and within 360 days    Hemoglobin  Date Value Ref Range Status  08/06/2023 12.9 11.7 - 15.5 g/dL Final   HGB  Date Value Ref Range Status  06/15/2014 10.9 (L) 12.0 - 16.0 g/dL Final         Passed - HCT in normal range and within 360 days    HCT  Date Value Ref Range Status  08/06/2023 40.8 35.0 - 45.0 % Final  06/15/2014 32.9 (L) 35.0 - 47.0 % Final         Passed - RBC in normal range and within 360 days    RBC  Date Value Ref Range Status  08/06/2023 4.61 3.80 - 5.10 Million/uL Final         Passed - Ferritin in normal range and within 360 days    Ferritin  Date Value Ref Range Status  08/06/2023 16 16 - 288 ng/mL Final         Passed - Valid encounter within last 12 months    Recent Outpatient Visits           1 week ago Mixed incontinence urge and stress   [facility] Leavy Mole, PA-C   2 weeks ago Left-sided low back pain with left-sided sciatica, unspecified chronicity   [facility] Leavy Mole, [facility]   1 month ago Muscle cramps   [facility] Leavy M[facility], [facility]   2 months ago  Candidal intertrigo   [facility] Leavy Mole, [facility]-C   2 months ago Left shoulder pain, unspecified chronicity   [place] [facility] Leavy Mole, PA-C       Future Appointments             In 1 month Marilynne, Rosaline SAILOR, MD [facility] Urogynecology at [facility], [facility]

## 2023-09-17 ENCOUNTER — Ambulatory Visit: Admitting: Family Medicine

## 2023-09-19 ENCOUNTER — Other Ambulatory Visit: Payer: Self-pay | Admitting: Family Medicine

## 2023-09-19 DIAGNOSIS — I252 Old myocardial infarction: Secondary | ICD-10-CM

## 2023-09-19 DIAGNOSIS — Z713 Dietary counseling and surveillance: Secondary | ICD-10-CM

## 2023-09-19 DIAGNOSIS — E66813 Obesity, class 3: Secondary | ICD-10-CM

## 2023-09-20 ENCOUNTER — Telehealth: Payer: Self-pay

## 2023-09-20 DIAGNOSIS — E66813 Obesity, class 3: Secondary | ICD-10-CM

## 2023-09-20 DIAGNOSIS — Z713 Dietary counseling and surveillance: Secondary | ICD-10-CM

## 2023-09-20 DIAGNOSIS — I252 Old myocardial infarction: Secondary | ICD-10-CM

## 2023-09-20 MED ORDER — SEMAGLUTIDE-WEIGHT MANAGEMENT 1 MG/0.5ML ~~LOC~~ SOAJ: 1.0000 mg | SUBCUTANEOUS | 0 refills | Status: DC

## 2023-09-20 NOTE — Telephone Encounter (Signed)
 Requested medication (s) are due for refill today:   Provider to review  Requested medication (s) are on the active medication list:   Yes  Future visit scheduled:   No.   LOV 09/06/2023 virtual visit   Last ordered: Wegovy  0.25  2 ml, 0 refills;   Wegovy  0.5 discontinued 08/28/2023.  Then there is an order on 08/30/2023 2 ml, 2 refill Unable to refill because not sure which does she is to be on.   Provider to review.   Requested Prescriptions  Pending Prescriptions Disp Refills   WEGOVY  0.5 MG/0.5ML SOAJ [Pharmacy Med Name: WEGOVY  0.5MG /0.5ML INJ (4 PENS)] 2 mL 0    Sig: ADMINISTER 0.5 MG UNDER THE SKIN 1 TIME A WEEK     Endocrinology:  Diabetes - GLP-1 Receptor Agonists - semaglutide  Failed - 09/20/2023  2:06 PM      Failed - HBA1C in normal range and within 180 days    Hemoglobin A1C  Date Value Ref Range Status  04/14/2022 4.1  Final    Comment:    House call report   Hgb A1c MFr Bld  Date Value Ref Range Status  08/06/2023 5.9 (H) <5.7 % Final    Comment:    For someone without known diabetes, a hemoglobin  A1c value between 5.7% and 6.4% is consistent with prediabetes and should be confirmed with a  follow-up test. . For someone with known diabetes, a value <7% indicates that their diabetes is well controlled. A1c targets should be individualized based on duration of diabetes, age, comorbid conditions, and other considerations. . This assay result is consistent with an increased risk of diabetes. . Currently, no consensus exists regarding use of hemoglobin A1c for diagnosis of diabetes for children. .          Passed - Cr in normal range and within 360 days    Creat  Date Value Ref Range Status  08/06/2023 0.96 0.50 - 1.05 mg/dL Final   Creatinine, Urine  Date Value Ref Range Status  10/04/2015 CANCELED 20 - 320 mg/dL     Comment:    Test not performed, no urine was received.    Result canceled by the ancillary   10/04/2015 128 20 - 320 mg/dL Final          Passed - Valid encounter within last 6 months    Recent Outpatient Visits           2 weeks ago Mixed incontinence urge and stress   [facility] Leavy Mole, PA-C   3 weeks ago Left-sided low back pain with left-sided sciatica, unspecified chronicity   [facility] Leavy Mole, PA-C   1 month ago Muscle cramps   [facility] Leavy Mole, [facility]   2 months ago Candidal intertrigo   [place] [facility] Leavy Mole, PA-C   2 months ago Left shoulder pain, unspecified chronicity   [place] [facility] Leavy Mole, [place]       Future Appointments             In 4 weeks Marilynne Rosaline SAILOR, MD [facility] Urogynecology at [facility], [facility]             WEGOVY  0.25 MG/0.5ML EMMANUEL [place] Med Name: WEGOVY  0.25MG /0.5ML INJ ( 4 PENS)] 2 mL 0    Sig: ADMINISTER 0.25 MG UNDER THE SKIN 1 TIME A WEEK     Endocrinology:  Diabetes -  GLP-1 Receptor Agonists - semaglutide  Failed - 09/20/2023  2:06 PM      Failed - HBA1C in normal range and within 180 days    Hemoglobin A1C  Date Value Ref Range Status  04/14/2022 4.1  Final    Comment:    House call report   Hgb A1c MFr Bld  Date Value Ref Range Status  08/06/2023 5.9 (H) <5.7 % Final    Comment:    For someone without known diabetes, a hemoglobin  A1c value between 5.7% and 6.4% is consistent with prediabetes and should be confirmed with a  follow-up test. . For someone with known diabetes, a value <7% indicates that their diabetes is well controlled. A1c targets should be individualized based on duration of diabetes, age, comorbid conditions, and other considerations. . This assay result is consistent with an increased risk of diabetes. . Currently, no consensus exists regarding use of hemoglobin A1c for diagnosis of diabetes for children. .          Passed - Cr in normal range and within 360  days    Creat  Date Value Ref Range Status  08/06/2023 0.96 0.50 - 1.05 mg/dL Final   Creatinine, Urine  Date Value Ref Range Status  10/04/2015 CANCELED 20 - 320 mg/dL     Comment:    Test not performed, no urine was received.    Result canceled by the ancillary   10/04/2015 128 20 - 320 mg/dL Final         Passed - Valid encounter within last 6 months    Recent Outpatient Visits           2 weeks ago Mixed incontinence urge and stress   [facility] Leavy Mole, PA-C   3 weeks ago Left-sided low back pain with left-sided sciatica, unspecified chronicity   [facility] Leavy Mole, PA-C   1 month ago Muscle cramps   [facility] Leavy Mole, PA-C   2 months ago Candidal intertrigo   [facility] Leavy Mole, PA-C   2 months ago Left shoulder pain, unspecified chronicity   [place] [facility] Leavy Mole, [place]       Future Appointments             In 4 weeks Marilynne, Rosaline SAILOR, MD [facility] Urogynecology at [facility], [facility]

## 2023-09-20 NOTE — Telephone Encounter (Signed)
 Copied from CRM [id number]. Topic: Clinical - Medication Question >> Sep 20, 2023  3:09 PM Larissa RAMAN wrote: Reason for CRM: Patient states that she was informed by her pharmacy that her refill request for Wegovy  has been denied. Patient last visit was 09/06/23. Patient requesting a callback regarding her medication.

## 2023-09-25 ENCOUNTER — Other Ambulatory Visit: Payer: Self-pay

## 2023-09-25 NOTE — Patient Outreach (Signed)
 Complex Care Management   Visit Note  09/25/2023  Name:  Nicole Austin MRN: [id number] DOB: 1960/09/09  Situation: Referral received for Complex Care Management related to COPD I obtained verbal consent from Patient.  Visit completed with Patient  on the phone  Background:   Past Medical History:  Diagnosis Date   ADHD (attention deficit hyperactivity disorder)    Apnea, sleep 06/30/2015   Arthritis    Asthma    Asthma with acute exacerbation 06/30/2015   Benign neoplasm of ascending colon    Bipolar 1 disorder (HCC)    Chronic pain    COPD, moderate (HCC) 11/18/2013   Depression    DOE (dyspnea on exertion)    Fall 07/04/2022   GERD (gastroesophageal reflux disease) 08/30/2015   Gout 08/16/2014   History of acute myocardial infarction 06/30/2015   History of panic attacks    HPV (human papilloma virus) infection 07/17/2013   Hyperlipidemia    Left knee pain 07/06/2022   Low HDL (under 40) 08/30/2015   MI (myocardial infarction) (HCC) 2018   MRSA infection 2023   groin abscess   Nose colonized with MRSA 03/21/2022   a.) PCR (+) prior to LEFT THA   Osteoporosis    Parkinson disease (HCC)    Polyp of sigmoid colon    Pre-diabetes    Prediabetes 05/20/2016   PTSD (post-traumatic stress disorder)    Restless leg syndrome    Rhabdomyolysis    Sleep apnea 06/30/2015   Stage 3 severe COPD by GOLD classification (HCC) 11/18/2013   Stroke (HCC) 2018   right arm weakness   Traumatic rhabdomyolysis (HCC) 07/04/2022   Tremors of nervous system    hands   Uncomplicated asthma 06/30/2015   Varicella 02/25/2017    Assessment: Patient Reported Symptoms:  Cognitive Cognitive Status: No symptoms reported Cognitive/Intellectual Conditions Management [RPT]: None reported or documented in medical history or problem list   Health Maintenance Behaviors: Annual physical exam, Sleep adequate, Social activities, Healthy diet Healing Pattern: Average Health Facilitated by:  Healthy diet, Pain control  Neurological Oher Neurological Symptoms/Conditions [RPT]: tremors improved with primadone Neurological Management Strategies: Medication therapy, Routine screening  HEENT HEENT Symptoms Reported: No symptoms reported      Cardiovascular Cardiovascular Symptoms Reported: No symptoms reported Does patient have uncontrolled Hypertension?: No Cardiovascular Management Strategies: Routine screening  Respiratory Additional Respiratory Details: O2 2L prn Respiratory Management Strategies: Adequate rest, Medication therapy, Routine screening, Oxygen  therapy  Endocrine Endocrine Symptoms Reported: No symptoms reported Is patient diabetic?: No    Gastrointestinal Gastrointestinal Symptoms Reported: Nausea, Constipation Additional Gastrointestinal Details: managed with metamucil, promethazine  for nause due to wegovy  shot Gastrointestinal Management Strategies: Medication therapy    Genitourinary Genitourinary Symptoms Reported: Incontinence Additional Genitourinary Details: still issue seeing urogyn 10/18/23 Genitourinary Management Strategies: Incontinence garment/pad  Integumentary Integumentary Symptoms Reported: No symptoms reported Skin Management Strategies: Routine screening  Musculoskeletal Additional Musculoskeletal Details: back and hip pain improved after injection - cane prn, discussed to use if balance off or tired Musculoskeletal Management Strategies: Medication therapy, Routine screening, Medical device Falls in the past year?: No Number of falls in past year: 2 or more Was there an injury with Fall?: No Fall Risk Category Calculator: 1 Patient Fall Risk Level: Low Fall Risk Patient at Risk for Falls Due to: History of fall(s), Impaired balance/gait Fall risk Follow up: Falls evaluation completed, Falls prevention discussed  Psychosocial Psychosocial Symptoms Reported: No symptoms reported Additional Psychological Details: reports feeling much  better Behavioral Management Strategies:  Counseling, Medication therapy, Support group Behavioral Health Comment: Psychiatrist and therapist with good results Major Change/Loss/Stressor/Fears (CP): Denies Techniques to Cope with Loss/Stress/Change: Counseling, Medication Quality of Family Relationships: supportive Do you feel physically threatened by others?: No      09/25/2023    2:20 PM  Depression screen PHQ 2/9  Decreased Interest 2  Down, Depressed, Hopeless 1  PHQ - 2 Score 3  Altered sleeping 0  Tired, decreased energy 3  Change in appetite 0  Feeling bad or failure about yourself  0  Trouble concentrating 1  Moving slowly or fidgety/restless 0  Suicidal thoughts 0  PHQ-9 Score 7  Difficult doing work/chores Somewhat difficult    There were no vitals filed for this visit.  Medications Reviewed Today     Reviewed by Devra Lands, RN (Registered Nurse) on 09/25/23 at 1404  Med List Status: <None>   Medication Order Taking? Sig Documenting Provider Last Dose Status Informant  albuterol  (VENTOLIN  HFA) 108 (90 Base) MCG/ACT inhaler [id number] Yes INHALE TWO PUFFS INTO THE LUNGS EVERY SIX  HOURS AS NEEDED FOR WHEEZING OR SHORTNESS OF BREATH. Tapia, [name], PA-C  Active   ALPRAZolam  (XANAX ) 0.5 MG tablet [id number] Yes Take 1 tablet (0.5 mg total) by mouth 2 (two) times daily. Josette Ade, MD  Active Self  cyanocobalamin  (VITAMIN B12) 1000 MCG tablet [id number]  Take 1 tablet (1,000 mcg total) by mouth daily. Tapia, [name], PA-C  Active   divalproex  (DEPAKOTE ) 250 MG DR tablet [id number] Yes Take 1 tablet (250 mg total) by mouth 2 (two) times daily.  Patient taking differently: Take 250 mg by mouth daily. Patient reports now just daily at bedtime and Latuda  increased by psychiatrist - states will resume prior dose BID once Latuda  Reduced to 40 mg   Faheem, Uzma, MD  Active Self  escitalopram  (LEXAPRO ) 20 MG tablet [id number] Yes Take 20 mg by mouth daily. [provider]  Active Self  Iron , Ferrous Sulfate , 325 (65 Fe) MG TABS [id number] Yes Take 325 mg by mouth every other day. Leavy Mole, PA-C  Active   lidocaine  (LIDODERM ) 5 % [id number]  Place 1 patch onto the skin daily as needed. Apply to affected area of pain, left low back/SI joint area  Patient not taking: Reported on 09/25/2023   Tapia, [name], PA-C  Active   Lurasidone  HCl (LATUDA ) 60 MG TABS [id number] Yes Take 1 tablet (60 mg total) by mouth daily with breakfast.  Patient taking differently: Take 80 mg by mouth daily. Patient reported was taking latuda  80 mg daily at bedtime per Psychiatrist Emmie Chol - reducing back to 40 mg because she could not get up in mornings   Josette Ade, MD  Active Self  magnesium  oxide (MAG-OX) 400 MG tablet [id number] Yes Take 1 tablet (400 mg total) by mouth daily. Tapia, [name], PA-C  Active   miconazole  (MICOTIN) 2 % powder [id number] Yes Apply topically 2 (two) times daily as needed for itching (Candidal intertrigo). Tapia, [name], PA-C  Active   mometasone -formoterol  (DULERA ) 100-5 MCG/ACT AERO [id number] Yes Inhale 2 puffs into the lungs 2 (two) times daily. [place], Camellia PARAS, DO  Active            Med Note NORVEL KIRSCH   Tue Aug 06, 2023  1:53 PM)    Multiple Vitamins-Minerals (MULTIVITAMIN WITH MINERALS) tablet [id number] Yes Take 1 tablet by mouth daily. [provider]  Active Self  naloxone  (NARCAN ) nasal spray 4 mg/0.1 mL [id number]  Spray into nostril  with signs of opioid related oversedation or overdose  Patient not taking: Reported on 09/25/2023   Patti Rosina SAUNDERS, PA-C  Active   nystatin  (MYCOSTATIN /NYSTOP ) powder [id number] Yes Apply 1 Application topically 3 (three) times daily as needed (yeast/fungal rashes or intertrigo). Tapia, [name], PA-C  Active   predniSONE  (DELTASONE ) 20 MG tablet [id number]  2 tabs poqday 1-3, 1 tabs poqday 4-6  Patient not taking: Reported on 09/25/2023   Tapia, [name], PA-C  Consider Medication Status and Discontinue    pregabalin  (LYRICA ) 50 MG capsule [id number]  Take 1 capsule (50 mg total) by mouth 2 (two) times daily.  Patient not taking: Reported on 09/06/2023   Tapia, [name], PA-C  Active   primidone (MYSOLINE) 50 MG tablet [id number] Yes Take 50 mg by mouth in the morning and at bedtime. [provider]  Active   rOPINIRole  (REQUIP  XL) 4 MG 24 hr tablet [id number] Yes Take 4 mg by mouth at bedtime. [provider]  Active Self  Semaglutide -Weight Management 1 MG/0.5ML SOAJ [id number] Yes Inject 1 mg into the skin once a week. Tapia, [name], PA-C  Active   tiZANidine  (ZANAFLEX ) 2 MG tablet [id number] Yes Take 1-2 tablets (2-4 mg total) by mouth every 8 (eight) hours as needed for muscle spasms. Tapia, [name], PA-C  Active   umeclidinium bromide  (INCRUSE ELLIPTA ) 62.5 MCG/ACT AEPB [id number] Yes Inhale 1 puff into the lungs daily. [place], Camellia PARAS, DO  Active             Recommendation:   PCP Follow-up Continue Current Plan of Care  Follow Up Plan:   Telephone follow-up in 1 month  Nestora Duos, MSN, RN [facility]  [facility], [facility] RN Care Manager Direct Dial: [phone] Fax: [phone]

## 2023-09-25 NOTE — Patient Instructions (Signed)
 Visit Information  Thank you for taking time to visit with me today. Please don't hesitate to contact me if I can be of assistance to you before our next scheduled appointment.  Your next care management appointment is by telephone on 10/23/23 at 2:00 pm  Telephone follow-up in 1 month  Please call the care guide team at [phone] if you need to cancel, schedule, or reschedule an appointment.   Please call the Suicide and Crisis Lifeline: 988 call the [place]  National Suicide Prevention Lifeline: 657-849-9353 or TTY: 228-353-3545 TTY (786)005-7346) to talk to a trained counselor call 1-800-273-TALK (toll free, 24 hour hotline) go to [facility] [address], [place] [phone]) call 911 if you are experiencing a Mental Health or Behavioral Health Crisis or need someone to talk to.  Nestora Duos, MSN, RN [facility], [facility] RN Care Manager Direct Dial: [phone] Fax: [phone]  Urinary Incontinence in Females: How to Manage Urinary incontinence, or UI, happens if you can't always control when you pee. If you think you have UI, it's important to talk to your health care provider. How does UI affect me? UI can make it hard to enjoy daily activities. It can affect your social life. It can also affect your mental health. What actions can I take to manage UI? Talk to your provider. There're things you can do and treatments that can help. Change your lifestyle  Quit smoking. Do not smoke, vape, or use nicotine  or tobacco. Lose weight or keep a healthy weight. Do pelvic floor muscle exercises as told. You may hear these called Kegel exercises. Stay active. Eat a healthy diet. Change your behavior Try bladder training. This may include using the bathroom at set times during the day. Use the bathroom every 3-4 hours, even if you don't feel the need to pee. Try to empty your bladder of all pee  every time you go. After peeing, wait a minute. Then try to pee again. Find ways to reduce bladder urges. This can include distraction techniques or controlled breathing exercises. Make sure you're in a relaxed position while peeing. If needed, wear pads to absorb pee that leaks. Get treatment Treatment for UI depends on the type of incontinence that you have and its cause. This may include: Medicines to relax the bladder muscles. Treatments, such as: Pulses of electricity to help change bladder reflexes (electrical nerve stimulation). A shot of collagen or carbon beads into the bladder opening. This can help thicken tissue and close the bladder opening. Botox injections to relax the bladder muscles. Surgery. Products such as: A pessary. This is a device to prevent pee leaks. It's placed in your vagina to help support your pelvic floor muscles. A catheter. This is a soft tube that's put into your urethra to drain pee from the bladder. The catheter may be connected to a bag that collects pee. Portable commodes, bedpans, or urinals.  Follow these instructions at home: Eating and drinking Change your diet as told. You may be asked to: Drink fluids in small amounts throughout the day instead of large amounts at one time. Use less caffeine or alcohol. Eat foods that are high in fiber, such as beans, whole grains, or fresh fruits and vegetables. General instructions Take your medicines only as told. Keep all follow-up visits. Your provider may need to change your treatment plan if needed. Where to find more information The Office on Women's Health: TravelLesson.ca The [employer]  Gynecologists (ACOG): acog.org Contact a health care provide if: Your symptoms don't get better after treatment. Your symptoms get worse. You have new symptoms. This information is not intended to replace advice given to you by your health care provider. Make sure you discuss any  questions you have with your health care provider. Document Revised: 01/16/2023 Document Reviewed: 11/15/2022 Elsevier Patient Education  2025 [employer].

## 2023-10-05 DIAGNOSIS — J441 Chronic obstructive pulmonary disease with (acute) exacerbation: Secondary | ICD-10-CM | POA: Diagnosis not present

## 2023-10-05 DIAGNOSIS — J449 Chronic obstructive pulmonary disease, unspecified: Secondary | ICD-10-CM | POA: Diagnosis not present

## 2023-10-07 DIAGNOSIS — J449 Chronic obstructive pulmonary disease, unspecified: Secondary | ICD-10-CM | POA: Diagnosis not present

## 2023-10-07 DIAGNOSIS — J441 Chronic obstructive pulmonary disease with (acute) exacerbation: Secondary | ICD-10-CM | POA: Diagnosis not present

## 2023-10-16 ENCOUNTER — Other Ambulatory Visit: Payer: Self-pay | Admitting: Family Medicine

## 2023-10-16 DIAGNOSIS — I252 Old myocardial infarction: Secondary | ICD-10-CM

## 2023-10-16 DIAGNOSIS — Z713 Dietary counseling and surveillance: Secondary | ICD-10-CM

## 2023-10-16 DIAGNOSIS — E66813 Obesity, class 3: Secondary | ICD-10-CM

## 2023-10-17 ENCOUNTER — Ambulatory Visit: Admitting: Obstetrics and Gynecology

## 2023-10-17 NOTE — Telephone Encounter (Signed)
 Requested Prescriptions  Pending Prescriptions Disp Refills   semaglutide -weight management (WEGOVY ) 1 MG/0.5ML SOAJ SQ injection [Pharmacy Med Name: WEGOVY  1MG /0.5ML  INJ (4  PENS)] 2 mL 1    Sig: ADMINISTER 1 MG UNDER THE SKIN 1 TIME A WEEK     Endocrinology:  Diabetes - GLP-1 Receptor Agonists - semaglutide  Failed - 10/17/2023  2:07 PM      Failed - HBA1C in normal range and within 180 days    Hemoglobin A1C  Date Value Ref Range Status  04/14/2022 4.1  Final    Comment:    House call report   Hgb A1c MFr Bld  Date Value Ref Range Status  08/06/2023 5.9 (H) <5.7 % Final    Comment:    For someone without known diabetes, a hemoglobin  A1c value between 5.7% and 6.4% is consistent with prediabetes and should be confirmed with a  follow-up test. . For someone with known diabetes, a value <7% indicates that their diabetes is well controlled. A1c targets should be individualized based on duration of diabetes, age, comorbid conditions, and other considerations. . This assay result is consistent with an increased risk of diabetes. . Currently, no consensus exists regarding use of hemoglobin A1c for diagnosis of diabetes for children. .          Passed - Cr in normal range and within 360 days    Creat  Date Value Ref Range Status  08/06/2023 0.96 0.50 - 1.05 mg/dL Final   Creatinine, Urine  Date Value Ref Range Status  10/04/2015 CANCELED 20 - 320 mg/dL     Comment:    Test not performed, no urine was received.    Result canceled by the ancillary   10/04/2015 128 20 - 320 mg/dL Final         Passed - Valid encounter within last 6 months    Recent Outpatient Visits           1 month ago Mixed incontinence urge and stress   [facility] Leavy Mole, PA-C   1 month ago Left-sided low back pain with left-sided sciatica, unspecified chronicity   [facility] Leavy Mole, PA-C   2 months ago Muscle cramps   [facility] Leavy Mole, PA-C   3 months ago Candidal intertrigo   [facility] Leavy Mole, PA-C   3 months ago Left shoulder pain, unspecified chronicity   [facility] Leavy Mole, [place]       Future Appointments             Tomorrow Marilynne, Rosaline SAILOR, MD [facility] at [facility], [facility]

## 2023-10-17 NOTE — Telephone Encounter (Signed)
 Requested Prescriptions  Pending Prescriptions Disp Refills   magnesium  oxide (MAG-OX) 400 (240 Mg) MG tablet [Pharmacy Med Name: MAGNESIUM  OXIDE 400MG  TABLET] 30 tablet 1    Sig: TAKE ONE TABLET (400 MG TOTAL) BY MOUTH DAILY.     Endocrinology:  Minerals - Magnesium  Supplementation Passed - 10/17/2023  1:56 PM      Passed - Mg Level in normal range and within 360 days    Magnesium   Date Value Ref Range Status  08/06/2023 1.6 1.5 - 2.5 mg/dL Final  95/80/7983 1.7 mg/dL Final    Comment:    8.2-7.5 THERAPEUTIC RANGE: 4-7 mg/dL TOXIC: > 10 mg/dL  ----------------------- NOTE: New Reference Range  05/04/14          Passed - Cr in normal range and within 360 days    Creat  Date Value Ref Range Status  08/06/2023 0.96 0.50 - 1.05 mg/dL Final   Creatinine, Urine  Date Value Ref Range Status  10/04/2015 CANCELED 20 - 320 mg/dL     Comment:    Test not performed, no urine was received.    Result canceled by the ancillary   10/04/2015 128 20 - 320 mg/dL Final         Passed - Valid encounter within last 12 months    Recent Outpatient Visits           1 month ago Mixed incontinence urge and stress   [facility] Leavy Mole, PA-C   1 month ago Left-sided low back pain with left-sided sciatica, unspecified chronicity   [facility] Leavy Mole, PA-C   2 months ago Muscle cramps   [facility] Leavy Mole, PA-C   3 months ago Candidal intertrigo   [facility] Leavy Mole, PA-C   3 months ago Left shoulder pain, unspecified chronicity   [facility] Leavy Mole, [place]       Future Appointments             Tomorrow Marilynne, Rosaline SAILOR, MD [facility] Urogynecology at [facility], [facility]

## 2023-10-18 ENCOUNTER — Ambulatory Visit: Admitting: Obstetrics and Gynecology

## 2023-10-23 ENCOUNTER — Other Ambulatory Visit: Payer: Self-pay

## 2023-10-23 ENCOUNTER — Ambulatory Visit: Payer: Self-pay | Admitting: *Deleted

## 2023-10-23 NOTE — Telephone Encounter (Signed)
 Copied from CRM #[id number]. Topic: Clinical - Red Word Triage >> Oct 23, 2023  2:09 PM Dedra B wrote: Red Word that prompted transfer to Nurse Triage: Pt is feeling weak and severely fatigued. She takes wegovy  and has been throwing up every time she takes it. She also experiences nausea and diarrhea. Warm transfer to [facility] nurse triage. Reason for Disposition  [1] Caller has URGENT medicine question about med that primary care doctor (or NP/PA) or specialist prescribed AND [2] triager unable to answer question  Answer Assessment - Initial Assessment Questions 1. NAME of MEDICINE: What medicine(s) are you calling about?     Wegovy   I'm the case manager is on the line.   2. QUESTION: What is your question? (e.g., double dose of medicine, side ef     I'm very weak and everything I eat is coming back up since I started the wegovy  for 2 weeks.    I started the Wegovy  July 29, 2023.   I take it every Thur. No water  or juice.   Nothing stays down.   Only sweet tea is  the only thing that stays down.   All food makes me vomit.   I was nauseas a lot in the beginning and curbed my appetite a lot too.   I thought I was adjusted to it but as time went on it started getting worse and I'm vomiting every time I eat now.    I'm so tired.    3. PRESCRIBER: Who prescribed the medicine? Reason: if prescribed by specialist, call should be referred to that group.     Michelene Cower, PC-C 4. SYMPTOMS: Do you have any symptoms? If Yes, ask: What symptoms are you having?  How bad are the symptoms (e.g., mild, moderate, severe)     Fatigue and vomiting everything up.   5. PREGNANCY:  Is there any chance that you are pregnant? When was your last menstrual period?     N/A due to age  Protocols used: Medication Question Call-A-AH FYI Only or Action Required?: FYI only for provider.  Patient was last seen in primary care on 08/03/2022 by Moishe Chiquita HERO, NP.  ARNETTA 08/28/2023 with Michelene Cower, PA-C  Called Nurse  Triage reporting Medication Problem. Taking Wegovy  for weight loss.  Started June 2025  Symptoms began several weeks ago. For the last 2 weeks she has been vomiting everything she eats.   Having fatigue and weakness.   Has had issues with nausea since she started taking it.  Interventions attempted: Rest, hydration, or home remedies.  Only thing she can keep down in sweet tea.   (Does not have diabetes)  Symptoms are: rapidly worsening.Feeling very fatigued.    Triage Disposition: See Physician Within 24 Hours  Due to transportation issues I scheduled her for 10/25/2023 with Michelene Cower, PA-C.   Instructions given to go to the ED if she becomes dizzy or lightheaded.   Pt. Agreeable to this plan.    Patient/caregiver understands and will follow disposition?: Yes

## 2023-10-23 NOTE — Addendum Note (Signed)
 Addended by: Areon Cocuzza on: 10/23/2023 02:45 PM   Modules accepted: Orders

## 2023-10-23 NOTE — Telephone Encounter (Signed)
 Called and made patient aware. Patient verbalized understanding.

## 2023-10-23 NOTE — Patient Instructions (Signed)
 Visit Information  Thank you for taking time to visit with me today. Please don't hesitate to contact me if I can be of assistance to you before our next scheduled appointment.  Your next care management appointment is by telephone on 10/29/2023 at 1:30 pm  Telephone follow-up 1 week  Please call the care guide team at [phone] if you need to cancel, schedule, or reschedule an appointment.   Please call the Suicide and Crisis Lifeline: 988 call the [place]  National Suicide Prevention Lifeline: 3527815920 or TTY: 832 230 5313 TTY 952-231-7518) to talk to a trained counselor call 1-800-273-TALK (toll free, 24 hour hotline) go to [facility] [address] [place] [phone]) call 911 if you are experiencing a Mental Health or Behavioral Health Crisis or need someone to talk to.   Nestora Duos, MSN, RN [place]  [facility], [facility] RN Care Manager Direct Dial: [phone] Fax: [phone]  Dehydration, Adult Dehydration is a condition in which there is not enough water  or other fluids in the body. This happens when a person loses more fluids than they take in. Important organs cannot work right without the right amount of fluids. Any loss of fluids from the body can cause dehydration. Dehydration can be mild, worse, or very bad. It should be treated right away to keep it from getting very bad. What are the causes? Conditions that cause loss of water  in the body. They include: Watery poop (diarrhea). Vomiting. Sweating a lot. Fever. Infection. Peeing (urinating) a lot. Not drinking enough fluids. Certain medicines, such as medicines that take extra fluid out of the body (diuretics). Lack of safe drinking water . Not being able to get enough water  and food. What increases the risk? Having a long-term (chronic) illness that has not been treated the right way, such as: Diabetes. Heart disease. Kidney  disease. Being 96 years of age or older. Having a disability. Living in a place that is high above the ground or sea (high in altitude). The thinner, drier air causes more fluid loss. Doing exercises that put stress on your body for a long time. Being active when in hot places. What are the signs or symptoms? Symptoms of dehydration depend on how bad it is. Mild or worse dehydration Thirst. Dry lips or dry mouth. Feeling dizzy or light-headed. Muscle cramps. Passing little pee or dark pee. Pee may be the color of tea. Headache. Very bad dehydration Changes in skin. Skin may: Be cold to the touch (clammy). Be blotchy or pale. Not go back to normal right after you pinch it and let it go. Little or no tears, pee, or sweat. Fast breathing. Low blood pressure. Weak pulse. Pulse that is more than 100 beats a minute when you are sitting still. Other changes, such as: Feeling very thirsty. Eyes that look hollow (sunken). Cold hands and feet. Being confused. Being very tired (lethargic) or having trouble waking from sleep. Losing weight. Loss of consciousness. How is this treated? Treatment for this condition depends on how bad your dehydration is. Treatment should start right away. Do not wait until your condition gets very bad. Very bad dehydration is an emergency. You will need to go to a hospital. Mild or worse dehydration can be treated at home. You may be asked to: Drink more fluids. Drink an oral rehydration solution (ORS). This drink gives you the right amount of fluids, salts, and minerals (electrolytes). Very bad dehydration can be treated: With fluids through an IV tube. By  correcting low levels of electrolytes in the body. By treating the problem that caused your dehydration. Follow these instructions at home: Oral rehydration solution If told by your doctor, drink an ORS: Make an ORS. Use instructions on the package. Start by drinking small amounts, about  cup (120  mL) every 5-10 minutes. Slowly drink more until you have had the amount that your doctor said to have.  Eating and drinking  Drink enough clear fluid to keep your pee pale yellow. If you were told to drink an ORS, finish the ORS first. Then, start slowly drinking other clear fluids. Drink fluids such as: Water . Do not drink only water . Doing that can make the salt (sodium) level in your body get too low. Water  from ice chips you suck on. Fruit juice that you have added water  to (diluted). Low-calorie sports drinks. Eat foods that have the right amounts of salts and minerals, such as bananas, oranges, potatoes, tomatoes, or spinach. Do not drink alcohol. Avoid drinks that have caffeine or sugar. These include:: High-calorie sports drinks. Fruit juice that you did not add water  to. Soda. Coffee or energy drinks. Avoid foods that are greasy or have a lot of fat or sugar. General instructions Take over-the-counter and prescription medicines only as told by your doctor. Do not take sodium tablets. Doing that can make the salt level in your body get too high. Return to your normal activities as told by your doctor. Ask your doctor what activities are safe for you. Keep all follow-up visits. Your doctor may check and change your treatment. Contact a doctor if: You have pain in your belly (abdomen) and the pain: Gets worse. Stays in one place. You have a rash. You have a stiff neck. You get angry or annoyed more easily than normal. You are more tired or have a harder time waking than normal. You feel weak or dizzy. You feel very thirsty. Get help right away if: You have any symptoms of very bad dehydration. You vomit every time you eat or drink. Your vomiting gets worse, does not go away, or you vomit blood or green stuff. You are getting treatment, but symptoms are getting worse. You have a fever. You have a very bad headache. You have: Diarrhea that gets worse or does not go  away. Blood in your poop (stool). This may cause poop to look black and tarry. No pee in 6-8 hours. Only a small amount of pee in 6-8 hours, and the pee is very dark. You have trouble breathing. These symptoms may be an emergency. Get help right away. Call 911. Do not wait to see if the symptoms will go away. Do not drive yourself to the hospital. This information is not intended to replace advice given to you by your health care provider. Make sure you discuss any questions you have with your health care provider. Document Revised: 09/11/2021 Document Reviewed: 09/11/2021 Elsevier Patient Education  2024 [employer].

## 2023-10-23 NOTE — Patient Outreach (Signed)
 Complex Care Management   Visit Note  10/23/2023  Name:  Nicole Austin MRN: [id number] DOB: Oct 10, 1960  Situation: Referral received for Complex Care Management related to COPD I obtained verbal consent from Patient.  Visit completed with Patient  on the phone  Background:   Past Medical History:  Diagnosis Date   ADHD (attention deficit hyperactivity disorder)    Apnea, sleep 06/30/2015   Arthritis    Asthma    Asthma with acute exacerbation 06/30/2015   Benign neoplasm of ascending colon    Bipolar 1 disorder (HCC)    Chronic pain    COPD, moderate (HCC) 11/18/2013   Depression    DOE (dyspnea on exertion)    Fall 07/04/2022   GERD (gastroesophageal reflux disease) 08/30/2015   Gout 08/16/2014   History of acute myocardial infarction 06/30/2015   History of panic attacks    HPV (human papilloma virus) infection 07/17/2013   Hyperlipidemia    Left knee pain 07/06/2022   Low HDL (under 40) 08/30/2015   MI (myocardial infarction) (HCC) 2018   MRSA infection 2023   groin abscess   Nose colonized with MRSA 03/21/2022   a.) PCR (+) prior to LEFT THA   Osteoporosis    Parkinson disease (HCC)    Polyp of sigmoid colon    Pre-diabetes    Prediabetes 05/20/2016   PTSD (post-traumatic stress disorder)    Restless leg syndrome    Rhabdomyolysis    Sleep apnea 06/30/2015   Stage 3 severe COPD by GOLD classification (HCC) 11/18/2013   Stroke (HCC) 2018   right arm weakness   Traumatic rhabdomyolysis (HCC) 07/04/2022   Tremors of nervous system    hands   Uncomplicated asthma 06/30/2015   Varicella 02/25/2017    Assessment: Patient Reported Symptoms:  Cognitive Cognitive Status: Alert and oriented to person, place, and time, Insightful and able to interpret abstract concepts, Normal speech and language skills Cognitive/Intellectual Conditions Management [RPT]: None reported or documented in medical history or problem list   Health Maintenance Behaviors: Annual  physical exam Healing Pattern: Average  Neurological Neurological Review of Symptoms: No symptoms reported Neurological Management Strategies: Routine screening  HEENT HEENT Symptoms Reported: No symptoms reported HEENT Management Strategies: Routine screening    Cardiovascular Cardiovascular Symptoms Reported: No symptoms reported    Respiratory Other Respiratory Symptoms: SOB at times Additional Respiratory Details: O2 2L for DOE Respiratory Management Strategies: Oxygen  therapy, Routine screening, Medication therapy  Endocrine Endocrine Symptoms Reported: No symptoms reported Is patient diabetic?: No    Gastrointestinal Gastrointestinal Symptoms Reported: Cramping, Vomiting, Nausea, Constipation, Diarrhea Additional Gastrointestinal Details: Wegovy  - has had nausea for 2 days after, managed with phenergan , now reports vomiting after eating for last 2 Nicole Austin - can only drink sweet tea, has not notified anyone - conference call with PCP office - cramping, fatigue/weak, spoke with Triage Nurse Cindy appointment made  for Friday 8/29 at 1:00 due to transportation requirements, advised gatorade for electrolyte replacement, ED precations given, not taking Wegovy  tomorrow Gastrointestinal Management Strategies: Medication therapy Gastrointestinal Self-Management Outcome: 3 (uncertain)    Genitourinary Genitourinary Symptoms Reported: Incontinence Additional Genitourinary Details: missed urogyn, rescheduled for 01/31/2024 Genitourinary Management Strategies: Incontinence garment/pad  Integumentary Integumentary Symptoms Reported: Not assessed    Musculoskeletal Musculoskelatal Symptoms Reviewed: Back pain, Joint pain, Muscle pain, Limited mobility, Unsteady gait Additional Musculoskeletal Details: uses cane prn,   Falls in the past year?: No Number of falls in past year: 1 or less Was there an injury with Fall?:  No Fall Risk Category Calculator: 0 Patient Fall Risk Level: Low Fall  Risk Patient at Risk for Falls Due to: History of fall(s), Impaired balance/gait Fall risk Follow up: Falls evaluation completed, Falls prevention discussed  Psychosocial Additional Psychological Details: Latuda  now 20 mg Dr Conny,  fatigue hard to get up in am - not able to drink coffee which helped Behavioral Management Strategies: Counseling, Medication therapy Major Change/Loss/Stressor/Fears (CP): Denies Techniques to Cope with Loss/Stress/Change: Counseling, Medication Quality of Family Relationships: supportive Do you feel physically threatened by others?: No    10/23/2023    PHQ2-9 Depression Screening   Little interest or pleasure in doing things Nearly every day  Feeling down, depressed, or hopeless Several days  PHQ-2 - Total Score 4  Trouble falling or staying asleep, or sleeping too much More than half the days  Feeling tired or having little energy More than half the days  Poor appetite or overeating  More than half the days  Feeling bad about yourself - or that you are a failure or have let yourself or your family down Not at all  Trouble concentrating on things, such as reading the newspaper or watching television Not at all  Moving or speaking so slowly that other people could have noticed.  Or the opposite - being so fidgety or restless that you have been moving around a lot more than usual Not at all  Thoughts that you would be better off dead, or hurting yourself in some way Not at all  PHQ2-9 Total Score 10  If you checked off any problems, how difficult have these problems made it for you to do your work, take care of things at home, or get along with other people    Depression Interventions/Treatment      There were no vitals filed for this visit.  Medications Reviewed Today     Reviewed by Devra Lands, RN (Registered Nurse) on 10/23/23 at 1400  Med List Status: <None>   Medication Order Taking? Sig Documenting Provider Last Dose Status Informant   albuterol  (VENTOLIN  HFA) 108 (90 Base) MCG/ACT inhaler [id number] Yes INHALE TWO PUFFS INTO THE LUNGS EVERY SIX  HOURS AS NEEDED FOR WHEEZING OR SHORTNESS OF BREATH. Tapia, [name], PA-C  Active   ALPRAZolam  (XANAX ) 0.5 MG tablet [id number] Yes Take 1 tablet (0.5 mg total) by mouth 2 (two) times daily. Josette Ade, MD  Active Self  cyanocobalamin  (VITAMIN B12) 1000 MCG tablet [id number] Yes Take 1 tablet (1,000 mcg total) by mouth daily. Tapia, [name], PA-C  Active   divalproex  (DEPAKOTE ) 250 MG DR tablet [id number] Yes Take 1 tablet (250 mg total) by mouth 2 (two) times daily. Mohammed Goldstein, MD  Active Self  escitalopram  (LEXAPRO ) 20 MG tablet [id number] Yes Take 20 mg by mouth daily. [provider]  Active Self  Iron , Ferrous Sulfate , 325 (65 Fe) MG TABS [id number] Yes Take 325 mg by mouth every other day. Leavy Mole, PA-C  Active   lidocaine  (LIDODERM ) 5 % [id number]  Place 1 patch onto the skin daily as needed. Apply to affected area of pain, left low back/SI joint area  Patient not taking: Reported on 10/23/2023   Tapia, [name], PA-C  Active   Lurasidone  HCl (LATUDA ) 60 MG TABS [id number] Yes Take 1 tablet (60 mg total) by mouth daily with breakfast.  Patient taking differently: Take 80 mg by mouth daily. Patient reports down to 20 mg and tapering off   Josette Ade, MD  Active Self  magnesium   oxide (MAG-OX) 400 (240 Mg) MG tablet [id number] Yes TAKE ONE TABLET (400 MG TOTAL) BY MOUTH DAILY. Tapia, [name], PA-C  Active   miconazole  (MICOTIN) 2 % powder [id number] Yes Apply topically 2 (two) times daily as needed for itching (Candidal intertrigo). Tapia, [name], PA-C  Active   mometasone -formoterol  (DULERA ) 100-5 MCG/ACT AERO [id number] Yes Inhale 2 puffs into the lungs 2 (two) times daily. [place], Camellia PARAS, DO  Active            Med Note NORVEL KIRSCH   Tue Aug 06, 2023  1:53 PM)    Multiple Vitamins-Minerals (MULTIVITAMIN WITH MINERALS) tablet [id number] Yes Take 1 tablet by mouth daily.  [provider]  Active Self  naloxone  (NARCAN ) nasal spray 4 mg/0.1 mL [id number]  Spray into nostril with signs of opioid related oversedation or overdose  Patient not taking: Reported on 09/25/2023   Patti Rosina SAUNDERS, PA-C  Active   nystatin  (MYCOSTATIN /NYSTOP ) powder [id number] Yes Apply 1 Application topically 3 (three) times daily as needed (yeast/fungal rashes or intertrigo). Tapia, [name], PA-C  Active   predniSONE  (DELTASONE ) 20 MG tablet [id number]  2 tabs poqday 1-3, 1 tabs poqday 4-6  Patient not taking: Reported on 09/25/2023   Tapia, [name], PA-C  Active   pregabalin  (LYRICA ) 50 MG capsule [id number]  Take 1 capsule (50 mg total) by mouth 2 (two) times daily.  Patient not taking: Reported on 09/06/2023   Tapia, [name], PA-C  Active   primidone (MYSOLINE) 50 MG tablet [id number] Yes Take 50 mg by mouth in the morning and at bedtime. [provider]  Active   promethazine  (PHENERGAN ) 25 MG tablet [id number] Yes Take 25 mg by mouth every 6 (six) hours as needed for nausea or vomiting. [provider]  Active   psyllium (METAMUCIL) 58.6 % powder [id number] Yes Take 1 packet by mouth as needed. [provider]  Active   rOPINIRole  (REQUIP  XL) 4 MG 24 hr tablet [id number] Yes Take 4 mg by mouth at bedtime. [provider]  Active Self  semaglutide -weight management (WEGOVY ) 1 MG/0.5ML SOAJ SQ injection [id number] Yes ADMINISTER 1 MG UNDER THE SKIN 1 TIME A WEEK Leavy, [name], PA-C  Active   tiZANidine  (ZANAFLEX ) 2 MG tablet [id number] Yes Take 1-2 tablets (2-4 mg total) by mouth every 8 (eight) hours as needed for muscle spasms. Leavy Mole, PA-C  Active   umeclidinium bromide  (INCRUSE ELLIPTA ) 62.5 MCG/ACT AEPB [id number] Yes Inhale 1 puff into the lungs daily. [place], Eric J, DO  Active             Recommendation:   PCP Follow-up   Follow Up Plan:   9 /03/2023 at 1:30 pm re vomiting  Nestora Duos, MSN, RN [facility]  [facility], [facility] RN Care Manager Direct Dial: [phone] Fax: [phone]

## 2023-10-24 ENCOUNTER — Other Ambulatory Visit: Payer: Self-pay | Admitting: Family Medicine

## 2023-10-24 DIAGNOSIS — M5442 Lumbago with sciatica, left side: Secondary | ICD-10-CM

## 2023-10-25 ENCOUNTER — Ambulatory Visit: Admitting: Family Medicine

## 2023-10-25 NOTE — Progress Notes (Deleted)
 Patient ID: Nicole Austin, female    DOB: 1960/10/16, 63 y.o.   MRN: [id number]  PCP: Leavy Mole, PA-C  No chief complaint on file.   Subjective:   Nicole Austin is a 63 y.o. female, presents to clinic with CC of the following:  HPI    Patient Active Problem List   Diagnosis Date Noted   Moderately severe major depression (HCC) 08/07/2023   Bipolar affective disorder, current episode mixed (HCC) 03/15/2023   COPD exacerbation (HCC) 02/08/2023   COVID-19 virus infection 02/08/2023   S/P total left hip arthroplasty 10/09/2022   SIADH (syndrome of inappropriate ADH production) (HCC) 07/09/2022   Iron  deficiency anemia 07/07/2022   History of methicillin resistant staphylococcus aureus (MRSA) 05/03/2022   Avascular necrosis of bones of both hips (HCC) 05/03/2022   Chronic prescription benzodiazepine use 05/03/2022   Polypharmacy 04/09/2022   Sinus bradycardia 04/09/2022   Depression with anxiety 04/09/2022   MRSA (methicillin resistant staph aureus) culture positive 05/04/2021   Prolonged QT interval 01/01/2021   Bipolar disorder with depression (HCC) 02/25/2017   ADD (attention deficit disorder) 02/25/2017   Marijuana use 02/13/2017   Prediabetes 05/20/2016   Neuropathy, peripheral 05/07/2016   Vitamin B12 deficiency 10/24/2015   Fibromyalgia 08/30/2015   OP (osteoporosis) 06/30/2015   Sleep apnea 06/30/2015   HLD (hyperlipidemia) 05/13/2015   Class 3 severe obesity with serious comorbidity and body mass index (BMI) of 45.0 to 49.9 in adult 08/16/2014   Obesity, Class III, BMI 40-49.9 (morbid obesity) 08/16/2014   Low back pain with sciatica 08/04/2014   Tobacco abuse 08/04/2014   Anxiety, generalized 11/24/2013   COPD (chronic obstructive pulmonary disease) (HCC) 11/18/2013   Osteoarthritis of knee, unspecified 07/15/2013      Current Outpatient Medications:    albuterol  (VENTOLIN  HFA) 108 (90 Base) MCG/ACT inhaler, INHALE TWO PUFFS INTO THE LUNGS  EVERY SIX  HOURS AS NEEDED FOR WHEEZING OR SHORTNESS OF BREATH., Disp: 8.5 g, Rfl: 3   ALPRAZolam  (XANAX ) 0.5 MG tablet, Take 1 tablet (0.5 mg total) by mouth 2 (two) times daily., Disp: 10 tablet, Rfl: 0   cyanocobalamin  (VITAMIN B12) 1000 MCG tablet, Take 1 tablet (1,000 mcg total) by mouth daily., Disp: 90 tablet, Rfl: 1   divalproex  (DEPAKOTE ) 250 MG DR tablet, Take 1 tablet (250 mg total) by mouth 2 (two) times daily., Disp: 60 tablet, Rfl: 2   escitalopram  (LEXAPRO ) 20 MG tablet, Take 20 mg by mouth daily., Disp: , Rfl:    Iron , Ferrous Sulfate , 325 (65 Fe) MG TABS, Take 325 mg by mouth every other day., Disp: 45 tablet, Rfl: 1   lidocaine  (LIDODERM ) 5 %, Place 1 patch onto the skin daily as needed. Apply to affected area of pain, left low back/SI joint area (Patient not taking: Reported on 10/23/2023), Disp: 14 patch, Rfl: 0   Lurasidone  HCl (LATUDA ) 60 MG TABS, Take 1 tablet (60 mg total) by mouth daily with breakfast. (Patient taking differently: Take 80 mg by mouth daily. Patient reports down to 20 mg and tapering off), Disp: 30 tablet, Rfl: 0   magnesium  oxide (MAG-OX) 400 (240 Mg) MG tablet, TAKE ONE TABLET (400 MG TOTAL) BY MOUTH DAILY., Disp: 30 tablet, Rfl: 1   miconazole  (MICOTIN) 2 % powder, Apply topically 2 (two) times daily as needed for itching (Candidal intertrigo)., Disp: 85 g, Rfl: 2   mometasone -formoterol  (DULERA ) 100-5 MCG/ACT AERO, Inhale 2 puffs into the lungs 2 (two) times daily., Disp: 3 each, Rfl: 0  Multiple Vitamins-Minerals (MULTIVITAMIN WITH MINERALS) tablet, Take 1 tablet by mouth daily., Disp: , Rfl:    naloxone  (NARCAN ) nasal spray 4 mg/0.1 mL, Spray into nostril with signs of opioid related oversedation or overdose (Patient not taking: Reported on 09/25/2023), Disp: 1 each, Rfl: 0   nystatin  (MYCOSTATIN /NYSTOP ) powder, Apply 1 Application topically 3 (three) times daily as needed (yeast/fungal rashes or intertrigo)., Disp: 60 g, Rfl: 2   predniSONE  (DELTASONE ) 20  MG tablet, 2 tabs poqday 1-3, 1 tabs poqday 4-6 (Patient not taking: Reported on 09/25/2023), Disp: 9 tablet, Rfl: 0   pregabalin  (LYRICA ) 50 MG capsule, Take 1 capsule (50 mg total) by mouth 2 (two) times daily. (Patient not taking: Reported on 09/06/2023), Disp: 180 capsule, Rfl: 0   primidone (MYSOLINE) 50 MG tablet, Take 50 mg by mouth in the morning and at bedtime., Disp: , Rfl:    promethazine  (PHENERGAN ) 25 MG tablet, Take 25 mg by mouth every 6 (six) hours as needed for nausea or vomiting., Disp: , Rfl:    psyllium (METAMUCIL) 58.6 % powder, Take 1 packet by mouth as needed., Disp: , Rfl:    rOPINIRole  (REQUIP  XL) 4 MG 24 hr tablet, Take 4 mg by mouth at bedtime., Disp: , Rfl:    tiZANidine  (ZANAFLEX ) 2 MG tablet, Take 1-2 tablets (2-4 mg total) by mouth every 8 (eight) hours as needed for muscle spasms., Disp: 30 tablet, Rfl: 1   umeclidinium bromide  (INCRUSE ELLIPTA ) 62.5 MCG/ACT AEPB, Inhale 1 puff into the lungs daily., Disp: 90 each, Rfl: 0   Allergies  Allergen Reactions   Chantix  [Varenicline ] Nausea Only   Glycopyrrolate  Rash    Oral irritation     Social History   Tobacco Use   Smoking status: Former    Current packs/day: 0.00    Average packs/day: 0.5 packs/day for 36.0 years (18.0 ttl pk-yrs)    Types: Cigarettes    Start date: 03/13/1986    Quit date: 03/13/2022    Years since quitting: 1.6    Passive exposure: Past   Smokeless tobacco: Never   Tobacco comments:    Pt using nicotine  patches; 1/2 PPD as of 03/21/21  Vaping Use   Vaping status: Some Days   Substances: Nicotine , Flavoring  Substance Use Topics   Alcohol use: No    Alcohol/week: 0.0 standard drinks of alcohol   Drug use: Yes    Types: Marijuana    Comment: occasionally      Chart Review Today: ***  Review of Systems     Objective:   There were no vitals filed for this visit.  There is no height or weight on file to calculate BMI.  Physical Exam   Results for orders placed or  performed in visit on 08/06/23  Magnesium    Collection Time: 08/06/23  2:24 PM  Result Value Ref Range   Magnesium  1.6 1.5 - 2.5 mg/dL  CBC with Differential/Platelet   Collection Time: 08/06/23  2:24 PM  Result Value Ref Range   WBC 9.0 3.8 - 10.8 Thousand/uL   RBC 4.61 3.80 - 5.10 Million/uL   Hemoglobin 12.9 11.7 - 15.5 g/dL   HCT 59.1 64.9 - 54.9 %   MCV 88.5 80.0 - 100.0 fL   MCH 28.0 27.0 - 33.0 pg   MCHC 31.6 (L) 32.0 - 36.0 g/dL   RDW 84.0 (H) 88.9 - 84.9 %   Platelets 302 140 - 400 Thousand/uL   MPV 10.1 7.5 - 12.5 fL   Neutro Abs  5,292 1,500 - 7,800 cells/uL   Absolute Lymphocytes 2,601 850 - 3,900 cells/uL   Absolute Monocytes 720 200 - 950 cells/uL   Eosinophils Absolute 333 15 - 500 cells/uL   Basophils Absolute 54 0 - 200 cells/uL   Neutrophils Relative % 58.8 %   Total Lymphocyte 28.9 %   Monocytes Relative 8.0 %   Eosinophils Relative 3.7 %   Basophils Relative 0.6 %  Comprehensive metabolic panel with GFR   Collection Time: 08/06/23  2:24 PM  Result Value Ref Range   Glucose, Bld 68 65 - 99 mg/dL   BUN 14 7 - 25 mg/dL   Creat 9.03 9.49 - 8.94 mg/dL   eGFR 66 > OR = 60 fO/fpw/8.26f7   BUN/Creatinine Ratio SEE NOTE: 6 - 22 (calc)   Sodium 133 (L) 135 - 146 mmol/L   Potassium 4.8 3.5 - 5.3 mmol/L   Chloride 95 (L) 98 - 110 mmol/L   CO2 34 (H) 20 - 32 mmol/L   Calcium  9.3 8.6 - 10.4 mg/dL   Total Protein 6.6 6.1 - 8.1 g/dL   Albumin 3.7 3.6 - 5.1 g/dL   Globulin 2.9 1.9 - 3.7 g/dL (calc)   AG Ratio 1.3 1.0 - 2.5 (calc)   Total Bilirubin 0.3 0.2 - 1.2 mg/dL   Alkaline phosphatase (APISO) 75 37 - 153 U/L   AST 12 10 - 35 U/L   ALT 11 6 - 29 U/L  Iron , TIBC and Ferritin Panel   Collection Time: 08/06/23  2:24 PM  Result Value Ref Range   Iron  41 (L) 45 - 160 mcg/dL   TIBC 627 749 - 549 mcg/dL (calc)   %SAT 11 (L) 16 - 45 % (calc)   Ferritin 16 16 - 288 ng/mL  Vitamin B12   Collection Time: 08/06/23  2:24 PM  Result Value Ref Range   Vitamin B-12  233 200 - 1,100 pg/mL  T4, free   Collection Time: 08/06/23  2:24 PM  Result Value Ref Range   Free T4 1.0 0.8 - 1.8 ng/dL  TSH   Collection Time: 08/06/23  2:24 PM  Result Value Ref Range   TSH 0.86 0.40 - 4.50 mIU/L  CK (Creatine Kinase)   Collection Time: 08/06/23  2:24 PM  Result Value Ref Range   Total CK 120 20 - 243 U/L  Hemoglobin A1c   Collection Time: 08/06/23  2:24 PM  Result Value Ref Range   Hgb A1c MFr Bld 5.9 (H) <5.7 %   Mean Plasma Glucose 123 mg/dL   eAG (mmol/L) 6.8 mmol/L  Lipid panel   Collection Time: 08/06/23  2:24 PM  Result Value Ref Range   Cholesterol 160 <200 mg/dL   HDL 48 (L) > OR = 50 mg/dL   Triglycerides 892 <849 mg/dL   LDL Cholesterol (Calc) 92 mg/dL (calc)   Total CHOL/HDL Ratio 3.3 <5.0 (calc)   Non-HDL Cholesterol (Calc) 112 <130 mg/dL (calc)   *Note: Due to a large number of results and/or encounters for the requested time period, some results have not been displayed. A complete set of results can be found in Results Review.       Assessment & Plan:   ***     Michelene Cower, PA-C 10/25/23 12:32 PM

## 2023-10-25 NOTE — Telephone Encounter (Signed)
 Requested medications are due for refill today.  yes  Requested medications are on the active medications list.  yes  Last refill. 08/28/2023 #30 1 rf  Future visit scheduled.   yes  Notes to clinic.  Refill not delegated.    Requested Prescriptions  Pending Prescriptions Disp Refills   tiZANidine  (ZANAFLEX ) 2 MG tablet [Pharmacy Med Name: TIZANIDINE  HYDROCHLORIDE 2MG  TABLET] 30 tablet 1    Sig: TAKE ONE (1) TO TWO (2) TABLETS (2-4 MG TOTAL) BY MOUTH EVERY EIGHT HOURS AS NEEDED FOR MUSCLE SPASMS.     Not Delegated - Cardiovascular:  Alpha-2 Agonists - tizanidine  Failed - 10/25/2023  3:23 PM      Failed - This refill cannot be delegated      Passed - Valid encounter within last 6 months    Recent Outpatient Visits           1 month ago Mixed incontinence urge and stress   [facility] Leavy Mole, PA-C   1 month ago Left-sided low back pain with left-sided sciatica, unspecified chronicity   [facility] Leavy Mole, PA-C   2 months ago Muscle cramps   [facility] Leavy Mole, PA-C   3 months ago Candidal intertrigo   [facility] Leavy Mole, PA-C   3 months ago Left shoulder pain, unspecified chronicity   [facility] [place] [facility] Leavy Mole, PA-C       Future Appointments             In 3 months Marilynne, Rosaline SAILOR, MD [facility] Urogynecology at [facility], [facility]

## 2023-10-29 ENCOUNTER — Telehealth: Payer: Self-pay

## 2023-10-29 NOTE — Patient Instructions (Signed)
 Montie Kay Artola - I am sorry I was unable to reach you today for our scheduled appointment. I work with Tapia, [name], PA-C and am calling to support your healthcare needs. Please contact me at [phone] at your earliest convenience. I look forward to speaking with you soon.   Thank you,  Nestora Duos, MSN, RN [facility]  [facility], [facility] RN Care Manager Direct Dial: [phone] Fax: [phone]

## 2023-10-30 DIAGNOSIS — H524 Presbyopia: Secondary | ICD-10-CM | POA: Diagnosis not present

## 2023-11-04 ENCOUNTER — Ambulatory Visit: Admitting: Family Medicine

## 2023-11-05 DIAGNOSIS — G2119 Other drug induced secondary parkinsonism: Secondary | ICD-10-CM | POA: Diagnosis not present

## 2023-11-05 DIAGNOSIS — G250 Essential tremor: Secondary | ICD-10-CM | POA: Diagnosis not present

## 2023-11-05 DIAGNOSIS — J441 Chronic obstructive pulmonary disease with (acute) exacerbation: Secondary | ICD-10-CM | POA: Diagnosis not present

## 2023-11-06 ENCOUNTER — Ambulatory Visit: Admitting: Nurse Practitioner

## 2023-11-06 NOTE — Patient Outreach (Signed)
 RNCM called patient to reschedule missed visit 10/29/23. Patient reported issue with right hip, was unable to get up last night. Triaged - no stroke symptoms. Patient stated it is ongoing issue, has osteonecrosis of right hip and following up to schedule needed surgery. Appointment with [facility] scheduled.

## 2023-11-07 DIAGNOSIS — J449 Chronic obstructive pulmonary disease, unspecified: Secondary | ICD-10-CM | POA: Diagnosis not present

## 2023-11-07 DIAGNOSIS — J441 Chronic obstructive pulmonary disease with (acute) exacerbation: Secondary | ICD-10-CM | POA: Diagnosis not present

## 2023-11-11 ENCOUNTER — Other Ambulatory Visit: Payer: Self-pay | Admitting: Family Medicine

## 2023-11-11 DIAGNOSIS — M5442 Lumbago with sciatica, left side: Secondary | ICD-10-CM

## 2023-11-12 NOTE — Telephone Encounter (Signed)
 Requested medications are due for refill today.  yes  Requested medications are on the active medications list.  yes  Last refill. 10/25/2023 #30 1 rf  Future visit scheduled.   Yes - next year  Notes to clinic.  Refill not delegated.    Requested Prescriptions  Pending Prescriptions Disp Refills   tiZANidine  (ZANAFLEX ) 2 MG tablet [Pharmacy Med Name: TIZANIDINE  HYDROCHLORIDE 2MG  TABLET] 30 tablet 1    Sig: TAKE ONE (1) TO TWO (2) TABLETS (2-4 MG TOTAL) BY MOUTH EVERY EIGHT HOURS AS NEEDED FOR MUSCLE SPASMS.     Not Delegated - Cardiovascular:  Alpha-2 Agonists - tizanidine  Failed - 11/12/2023  5:26 PM      Failed - This refill cannot be delegated      Passed - Valid encounter within last 6 months    Recent Outpatient Visits           2 months ago Mixed incontinence urge and stress   [facility] Leavy Mole, PA-C   2 months ago Left-sided low back pain with left-sided sciatica, unspecified chronicity   [facility] Leavy Mole, PA-C   3 months ago Muscle cramps   [facility] Leavy Mole, PA-C   4 months ago Candidal intertrigo   [facility] Leavy Mole, PA-C   4 months ago Left shoulder pain, unspecified chronicity   [place] [facility] Leavy Mole, PA-C       Future Appointments             In 2 months Marilynne, Rosaline SAILOR, MD [facility] Urogynecology at [facility], [facility]

## 2023-11-15 ENCOUNTER — Other Ambulatory Visit: Payer: Self-pay

## 2023-11-15 NOTE — Patient Outreach (Signed)
 Complex Care Management   Visit Note  11/15/2023  Name:  Nicole Austin MRN: [id number] DOB: 1960-09-20  Situation: Referral received for Complex Care Management related to COPD I obtained verbal consent from Patient.  Visit completed with Patient  on the phone  Background:   Past Medical History:  Diagnosis Date   ADHD (attention deficit hyperactivity disorder)    Apnea, sleep 06/30/2015   Arthritis    Asthma    Asthma with acute exacerbation 06/30/2015   Benign neoplasm of ascending colon    Bipolar 1 disorder (HCC)    Chronic pain    COPD, moderate (HCC) 11/18/2013   Depression    DOE (dyspnea on exertion)    Fall 07/04/2022   GERD (gastroesophageal reflux disease) 08/30/2015   Gout 08/16/2014   History of acute myocardial infarction 06/30/2015   History of panic attacks    HPV (human papilloma virus) infection 07/17/2013   Hyperlipidemia    Left knee pain 07/06/2022   Low HDL (under 40) 08/30/2015   MI (myocardial infarction) (HCC) 2018   MRSA infection 2023   groin abscess   Nose colonized with MRSA 03/21/2022   a.) PCR (+) prior to LEFT THA   Osteoporosis    Parkinson disease (HCC)    Polyp of sigmoid colon    Pre-diabetes    Prediabetes 05/20/2016   PTSD (post-traumatic stress disorder)    Restless leg syndrome    Rhabdomyolysis    Sleep apnea 06/30/2015   Stage 3 severe COPD by GOLD classification (HCC) 11/18/2013   Stroke (HCC) 2018   right arm weakness   Traumatic rhabdomyolysis (HCC) 07/04/2022   Tremors of nervous system    hands   Uncomplicated asthma 06/30/2015   Varicella 02/25/2017    Assessment: Patient Reported Symptoms:  Cognitive Cognitive Status: No symptoms reported      Neurological      HEENT        Cardiovascular Cardiovascular Symptoms Reported: No symptoms reported    Respiratory Respiratory Symptoms Reported: Shortness of breath Other Respiratory Symptoms: unchanged O2 2L prn Respiratory Management Strategies:  Routine screening, Oxygen  therapy, Medication therapy  Endocrine Endocrine Symptoms Reported: Not assessed Is patient diabetic?: No    Gastrointestinal Gastrointestinal Symptoms Reported: No symptoms reported Additional Gastrointestinal Details: patient missed PCP appointment - stopped Wegovy  and no loger vomting, no nausea, resumed previous intake level      Genitourinary Genitourinary Symptoms Reported: Incontinence    Integumentary Additional Integumentary Details: rash in left groin again - using powder with improvement, still raw and painful but improved, aware sx requiring eval, discssed ways to manage incontenence, toileting schedule did not help, aware sx requiring eval by PCP    Musculoskeletal Musculoskelatal Symptoms Reviewed: Back pain, Joint pain, Limited mobility, Unsteady gait, Muscle pain Additional Musculoskeletal Details: unchanged, cane prn Musculoskeletal Management Strategies: Medical device, Medication therapy, Routine screening Falls in the past year?: No Number of falls in past year: 1 or less Was there an injury with Fall?: No Fall Risk Category Calculator: 0 Patient Fall Risk Level: Low Fall Risk Patient at Risk for Falls Due to: Impaired balance/gait, History of fall(s) Fall risk Follow up: Falls evaluation completed  Psychosocial Psychosocial Symptoms Reported: No symptoms reported Additional Psychological Details: weaned off Latuda  - feels great Behavioral Management Strategies: Medication therapy        11/15/2023    PHQ2-9 Depression Screening   Little interest or pleasure in doing things More than half the days  Feeling down, depressed,  or hopeless Several days  PHQ-2 - Total Score 3  Trouble falling or staying asleep, or sleeping too much    Feeling tired or having little energy    Poor appetite or overeating     Feeling bad about yourself - or that you are a failure or have let yourself or your family down    Trouble concentrating on things,  such as reading the newspaper or watching television    Moving or speaking so slowly that other people could have noticed.  Or the opposite - being so fidgety or restless that you have been moving around a lot more than usual    Thoughts that you would be better off dead, or hurting yourself in some way    PHQ2-9 Total Score    If you checked off any problems, how difficult have these problems made it for you to do your work, take care of things at home, or get along with other people    Depression Interventions/Treatment      There were no vitals filed for this visit.  Medications Reviewed Today     Reviewed by Devra Lands, RN (Registered Nurse) on 11/15/23 at 1026  Med List Status: <None>   Medication Order Taking? Sig Documenting Provider Last Dose Status Informant  albuterol  (VENTOLIN  HFA) 108 (90 Base) MCG/ACT inhaler [id number] Yes INHALE TWO PUFFS INTO THE LUNGS EVERY SIX  HOURS AS NEEDED FOR WHEEZING OR SHORTNESS OF BREATH. Tapia, [name], PA-C  Active   ALPRAZolam  (XANAX ) 0.5 MG tablet [id number] Yes Take 1 tablet (0.5 mg total) by mouth 2 (two) times daily.  Patient taking differently: Take 0.5 mg by mouth 2 (two) times daily. Patient stated psych increased to 3-4x/day   Josette Ade, MD  Active Self  cyanocobalamin  (VITAMIN B12) 1000 MCG tablet [id number] Yes Take 1 tablet (1,000 mcg total) by mouth daily. Tapia, [name], PA-C  Active   divalproex  (DEPAKOTE ) 250 MG DR tablet [id number] Yes Take 1 tablet (250 mg total) by mouth 2 (two) times daily. Mohammed Goldstein, MD  Active Self  escitalopram  (LEXAPRO ) 20 MG tablet [id number] Yes Take 20 mg by mouth daily. [provider]  Active Self  Iron , Ferrous Sulfate , 325 (65 Fe) MG TABS [id number] Yes Take 325 mg by mouth every other day. Tapia, [name], PA-C  Active   lidocaine  (LIDODERM ) 5 % [id number]  Place 1 patch onto the skin daily as needed. Apply to affected area of pain, left low back/SI joint area  Patient not taking: Reported  on 10/23/2023   Tapia, [name], PA-C  Active   Lurasidone  HCl (LATUDA ) 60 MG TABS [id number]  Take 1 tablet (60 mg total) by mouth daily with breakfast.  Patient not taking: Reported on 11/15/2023   Josette Ade, MD  Active Self  magnesium  oxide (MAG-OX) 400 (240 Mg) MG tablet [id number] Yes TAKE ONE TABLET (400 MG TOTAL) BY MOUTH DAILY. Tapia, [name], PA-C  Active   miconazole  (MICOTIN) 2 % powder [id number] Yes Apply topically 2 (two) times daily as needed for itching (Candidal intertrigo). Tapia, [name], PA-C  Active   mometasone -formoterol  (DULERA ) 100-5 MCG/ACT AERO [id number] Yes Inhale 2 puffs into the lungs 2 (two) times daily. [place], Camellia PARAS, DO  Active            Med Note NORVEL KIRSCH   Tue Aug 06, 2023  1:53 PM)    Multiple Vitamins-Minerals (MULTIVITAMIN WITH MINERALS) tablet [id number] Yes Take 1 tablet by mouth daily. [provider]  Active Self  naloxone  (NARCAN ) nasal spray 4 mg/0.1 mL [id number]  Spray into nostril with signs of opioid related oversedation or overdose  Patient not taking: Reported on 09/25/2023   Patti Rosina SAUNDERS, PA-C  Active   nystatin  (MYCOSTATIN /NYSTOP ) powder [id number] Yes Apply 1 Application topically 3 (three) times daily as needed (yeast/fungal rashes or intertrigo). Tapia, [name], PA-C  Active   predniSONE  (DELTASONE ) 20 MG tablet [id number]  2 tabs poqday 1-3, 1 tabs poqday 4-6  Patient not taking: Reported on 09/25/2023   Tapia, [name], PA-C  Active   pregabalin  (LYRICA ) 50 MG capsule [id number]  Take 1 capsule (50 mg total) by mouth 2 (two) times daily.  Patient not taking: Reported on 09/06/2023   Tapia, [name], PA-C  Active   primidone (MYSOLINE) 50 MG tablet [id number] Yes Take 50 mg by mouth in the morning and at bedtime. [provider]  Active   promethazine  (PHENERGAN ) 25 MG tablet [id number] Yes Take 25 mg by mouth every 6 (six) hours as needed for nausea or vomiting. [provider]  Active   psyllium (METAMUCIL) 58.6 %  powder [id number] Yes Take 1 packet by mouth as needed. [provider]  Active   rOPINIRole  (REQUIP  XL) 4 MG 24 hr tablet [id number] Yes Take 4 mg by mouth at bedtime. [provider]  Active Self  tiZANidine  (ZANAFLEX ) 2 MG tablet [id number] Yes TAKE ONE (1) TO TWO (2) TABLETS (2-4 MG TOTAL) BY MOUTH EVERY EIGHT HOURS AS NEEDED FOR MUSCLE SPASMS. Tapia, [name], PA-C  Active   umeclidinium bromide  (INCRUSE ELLIPTA ) 62.5 MCG/ACT AEPB [id number] Yes Inhale 1 puff into the lungs daily. [place], Eric J, DO  Active             Recommendation:   PCP Follow-up  Follow Up Plan:   Telephone follow-up in 1 month  Nestora Duos, MSN, RN [facility], [facility] RN Care Manager Direct Dial: [phone] Fax: [phone]'

## 2023-11-15 NOTE — Patient Instructions (Signed)
 Visit Information  Thank you for taking time to visit with me today. Please don't hesitate to contact me if I can be of assistance to you before our next scheduled appointment.  Your next care management appointment is by telephone on 12/13/2023 at 9:30 am  Telephone follow-up in 1 month  Please call the care guide team at [phone] if you need to cancel, schedule, or reschedule an appointment.   Please call the Suicide and Crisis Lifeline: 988 call the [place]  National Suicide Prevention Lifeline: 3096931463 or TTY: 484 078 7378 TTY (530) 429-3494) to talk to a trained counselor call 1-800-273-TALK (toll free, 24 hour hotline) go to [facility] [address], [place] [phone]) call 911 if you are experiencing a Mental Health or Behavioral Health Crisis or need someone to talk to.  Nestora Duos, MSN, RN [facility], [facility] RN Care Manager Direct Dial: [phone] Fax: [phone]

## 2023-11-22 NOTE — Progress Notes (Signed)
 Nicole Austin                                          MRN: [id number]   11/22/2023   The [facility] Quality Team Specialist reviewed this patient medical record for the purposes of chart review for care gap closure. The following were reviewed: chart review for care gap closure-kidney health evaluation for diabetes:eGFR  and uACR.    [facility]BCI Quality Team

## 2023-11-28 ENCOUNTER — Telehealth: Payer: Self-pay | Admitting: Family Medicine

## 2023-11-28 NOTE — Telephone Encounter (Signed)
 Copied from CRM [id number]. Topic: Clinical - Home Health Verbal Orders >> Nov 28, 2023  9:32 AM Leonette SQUIBB wrote: Pt called saying her health aid dropped off papers to be completed by Michelene Cower for Home Health services.  She is wanting to know when it will be completed and if she can get a call when it is done.  [phone]

## 2023-11-28 NOTE — Telephone Encounter (Signed)
 Called and notified patient it is signed and faxed.

## 2023-12-02 ENCOUNTER — Encounter: Payer: Self-pay | Admitting: Podiatry

## 2023-12-02 ENCOUNTER — Ambulatory Visit: Admitting: Podiatry

## 2023-12-02 DIAGNOSIS — M79674 Pain in right toe(s): Secondary | ICD-10-CM

## 2023-12-02 DIAGNOSIS — M79675 Pain in left toe(s): Secondary | ICD-10-CM | POA: Diagnosis not present

## 2023-12-02 DIAGNOSIS — B351 Tinea unguium: Secondary | ICD-10-CM

## 2023-12-05 DIAGNOSIS — J449 Chronic obstructive pulmonary disease, unspecified: Secondary | ICD-10-CM | POA: Diagnosis not present

## 2023-12-05 DIAGNOSIS — J441 Chronic obstructive pulmonary disease with (acute) exacerbation: Secondary | ICD-10-CM | POA: Diagnosis not present

## 2023-12-07 DIAGNOSIS — J449 Chronic obstructive pulmonary disease, unspecified: Secondary | ICD-10-CM | POA: Diagnosis not present

## 2023-12-07 DIAGNOSIS — J441 Chronic obstructive pulmonary disease with (acute) exacerbation: Secondary | ICD-10-CM | POA: Diagnosis not present

## 2023-12-08 NOTE — Progress Notes (Signed)
  Subjective:  Patient ID: Nicole Austin, female    DOB: 13-Apr-1960,  MRN: [id number]  Nicole Austin presents to clinic today for painful mycotic toenails of both feet that are difficult to trim. Pain interferes with daily activities and wearing enclosed shoe gear comfortably.  Chief Complaint  Patient presents with   Toe Pain    RFC. PA-C Leavy is her PCP. Last visit was in July 2025   New problem(s): None.   PCP is Leavy Mole, PA-C.  Allergies  Allergen Reactions   Chantix  [Varenicline ] Nausea Only   Glycopyrrolate  Rash    Oral irritation    Review of Systems: Negative except as noted in the HPI.  Objective: No changes noted in today's physical examination. There were no vitals filed for this visit. Nicole Austin is a pleasant 63 y.o. female in NAD. AAO x 3.  Vascular Examination: Capillary refill time immediate b/l. Palpable pedal pulses. Pedal hair present b/l. Pedal edema absent. No pain with calf compression b/l. Skin temperature gradient WNL b/l. No cyanosis or clubbing b/l. No ischemia or gangrene noted b/l.   Neurological Examination: Sensation grossly intact b/l with 10 gram monofilament.   Dermatological Examination: Pedal skin with normal turgor, texture and tone b/l.  No open wounds. No interdigital macerations.   Toenails 1-5 b/l thick, discolored, elongated with subungual debris and pain on dorsal palpation.   No hyperkeratotic nor porokeratotic lesions.  Musculoskeletal Examination: Muscle strength 5/5 to all lower extremity muscle groups bilaterally. No pain, crepitus or joint limitation noted with ROM bilateral LE. No gross bony deformities bilaterally.  Radiographs: None  Last A1c:      Latest Ref Rng & Units 08/06/2023    2:24 PM 02/09/2023    2:26 AM  Hemoglobin A1C  Hemoglobin-A1c <5.7 % 5.9  5.9    Assessment/Plan: 1. Pain due to onychomycosis of toenails of both feet   Patient was evaluated and treated. All patient's  and/or POA's questions/concerns addressed on today's visit. Toenails 1-5 debrided in length and girth without incident. Continue soft, supportive shoe gear daily. Report any pedal injuries to medical professional. Call office if there are any questions/concerns. -Patient/POA to call should there be question/concern in the interim.   Return in about 3 months (around 03/03/2024).  Nicole Austin, DPM      [facility] LOCATION: [address] [place] [address]                   Office [phone]   [facility] LOCATION: [address] [place], [place] [address] Office [phone]

## 2023-12-13 ENCOUNTER — Telehealth: Payer: Self-pay

## 2023-12-13 NOTE — Patient Instructions (Signed)
 Nicole Austin - I am sorry I was unable to reach you today for our scheduled appointment. I work with Tapia, [name], PA-C and am calling to support your healthcare needs. Please contact me at [phone] at your earliest convenience. I look forward to speaking with you soon.   Thank you,  Nestora Duos, MSN, RN [facility]  [facility], [facility] RN Care Manager Direct Dial: [phone] Fax: [phone]

## 2023-12-16 NOTE — Progress Notes (Signed)
 Claretta Kendra                                          MRN: [id number]   12/16/2023   The [facility] Quality Team Specialist reviewed this patient medical record for the purposes of chart review for care gap closure. The following were reviewed: chart review for care gap closure-kidney health evaluation for diabetes:eGFR  and uACR.    [facility]BCI Quality Team

## 2023-12-21 ENCOUNTER — Emergency Department

## 2023-12-21 ENCOUNTER — Emergency Department
Admission: EM | Admit: 2023-12-21 | Discharge: 2023-12-21 | Disposition: A | Discharge status: Home / Self Care | Attending: Emergency Medicine | Admitting: Emergency Medicine

## 2023-12-21 ENCOUNTER — Other Ambulatory Visit: Payer: Self-pay

## 2023-12-21 DIAGNOSIS — E871 Hypo-osmolality and hyponatremia: Secondary | ICD-10-CM | POA: Insufficient documentation

## 2023-12-21 DIAGNOSIS — R609 Edema, unspecified: Secondary | ICD-10-CM

## 2023-12-21 DIAGNOSIS — R600 Localized edema: Secondary | ICD-10-CM | POA: Insufficient documentation

## 2023-12-21 DIAGNOSIS — L03116 Cellulitis of left lower limb: Secondary | ICD-10-CM | POA: Diagnosis not present

## 2023-12-21 DIAGNOSIS — M79604 Pain in right leg: Secondary | ICD-10-CM | POA: Diagnosis present

## 2023-12-21 DIAGNOSIS — M7989 Other specified soft tissue disorders: Secondary | ICD-10-CM | POA: Diagnosis not present

## 2023-12-21 LAB — HEPATIC FUNCTION PANEL
ALT: 10 U/L (ref 0–44)
AST: 13 U/L — ABNORMAL LOW (ref 15–41)
Albumin: 3.2 g/dL — ABNORMAL LOW (ref 3.5–5.0)
Alkaline Phosphatase: 73 U/L (ref 38–126)
Bilirubin, Direct: 0.2 mg/dL (ref 0.0–0.2)
Indirect Bilirubin: 0.3 mg/dL (ref 0.3–0.9)
Total Bilirubin: 0.5 mg/dL (ref 0.0–1.2)
Total Protein: 6.8 g/dL (ref 6.5–8.1)

## 2023-12-21 LAB — BASIC METABOLIC PANEL WITH GFR
Anion gap: 9 (ref 5–15)
BUN: 5 mg/dL — ABNORMAL LOW (ref 8–23)
CO2: 29 mmol/L (ref 22–32)
Calcium: 8.8 mg/dL — ABNORMAL LOW (ref 8.9–10.3)
Chloride: 89 mmol/L — ABNORMAL LOW (ref 98–111)
Creatinine, Ser: 0.68 mg/dL (ref 0.44–1.00)
GFR, Estimated: >60 mL/min (ref >60)
Glucose, Bld: 92 mg/dL (ref 70–99)
Potassium: 4.5 mmol/L (ref 3.5–5.1)
Sodium: 127 mmol/L — ABNORMAL LOW (ref 135–145)

## 2023-12-21 LAB — CBC
HCT: 41.3 % (ref 36.0–46.0)
Hemoglobin: 13.4 g/dL (ref 12.0–15.0)
MCH: 29.4 pg (ref 26.0–34.0)
MCHC: 32.4 g/dL (ref 30.0–36.0)
MCV: 90.6 fL (ref 80.0–100.0)
Platelets: 233 K/uL (ref 150–400)
RBC: 4.56 MIL/uL (ref 3.87–5.11)
RDW: 13.7 % (ref 11.5–15.5)
WBC: 8 K/uL (ref 4.0–10.5)
nRBC: 0 % (ref 0.0–0.2)

## 2023-12-21 LAB — BRAIN NATRIURETIC PEPTIDE: B Natriuretic Peptide: 83.5 pg/mL (ref 0.0–100.0)

## 2023-12-21 MED ORDER — OXYCODONE HCL 5 MG PO TABS: 5.0000 mg | ORAL_TABLET | Freq: Four times a day (QID) | ORAL | 0 refills | Status: AC | PRN

## 2023-12-21 MED ORDER — CEPHALEXIN 500 MG PO CAPS: 500.0000 mg | ORAL_CAPSULE | Freq: Three times a day (TID) | ORAL | 0 refills | Status: AC

## 2023-12-21 MED ORDER — ACETAMINOPHEN 500 MG PO TABS
1000.0000 mg | ORAL_TABLET | Freq: Once | ORAL | Status: AC
  Administered 2023-12-21: 1000 mg via ORAL
  Filled 2023-12-21: qty 2

## 2023-12-21 MED ORDER — OXYCODONE HCL 5 MG PO TABS
5.0000 mg | ORAL_TABLET | Freq: Once | ORAL | Status: AC
  Administered 2023-12-21: 5 mg via ORAL
  Filled 2023-12-21: qty 1

## 2023-12-21 MED ORDER — FUROSEMIDE 20 MG PO TABS: 20.0000 mg | ORAL_TABLET | Freq: Every day | ORAL | 0 refills | Status: DC

## 2023-12-21 NOTE — ED Triage Notes (Signed)
 First nurse note: pt to ED [facility] from home for bilateral leg swelling and pain x1 week. States has been drinking more tea than normal.

## 2023-12-21 NOTE — ED Triage Notes (Addendum)
 Pt to ED via [facility] from home for bilateral leg swelling and pain in Left leg that started 4 days ago. Left leg does appear more red and swollen than Right. Pain 9/10 on left leg. Pt denies any SOB/CP.

## 2023-12-21 NOTE — ED Provider Notes (Signed)
 [facility] Provider Note    Event Date/Time   First MD Initiated Contact with Patient 12/21/23 1523     (approximate)   History   Leg Pain   HPI  Nicole Austin is a 63 y.o. female who comes in with concerns for bilateral leg pain.  Patient reports bilateral leg pain for the past 4 days.  She reports it is worse on her left leg and she has had some redness and worsening swelling on the left.  She is still able to ambulate.  She denies any chest pain, shortness of breath, falls or any other concerns.  She denies any history of blood clots or history of any infections on this leg.  Denies any falls.  Physical Exam   Triage Vital Signs: ED Triage Vitals  Encounter Vitals Group     BP 12/21/23 1434 (!) 129/109     Girls Systolic BP Percentile --      Girls Diastolic BP Percentile --      Boys Systolic BP Percentile --      Boys Diastolic BP Percentile --      Pulse Rate 12/21/23 1434 72     Resp 12/21/23 1434 18     Temp 12/21/23 1434 97.9 F (36.6 C)     Temp Source 12/21/23 1434 Oral     SpO2 12/21/23 1434 94 %     Weight 12/21/23 1435 235 lb (106.6 kg)     Height 12/21/23 1435 5' 1 (1.549 m)     Head Circumference --      Peak Flow --      Pain Score 12/21/23 1435 9     Pain Loc --      Pain Education --      Exclude from Growth Chart --     Most recent vital signs: Vitals:   12/21/23 1434  BP: (!) 129/109  Pulse: 72  Resp: 18  Temp: 97.9 F (36.6 C)  SpO2: 94%     General: Awake, no distress.  CV:  Good peripheral perfusion.  Resp:  Normal effort.  Abd:  No distention.  Other:  Has good distal pulse.  To flex and extend the ankle.  Able to flex and extend the knee.  Able to straight leg raise.  She does have increased swelling on the left compared to the right.  She has some redness noted. Trace edema noted bilaterally.  ED Results / Procedures / Treatments   Labs (all labs ordered are listed, but only abnormal results  are displayed) Labs Reviewed  BASIC METABOLIC PANEL WITH GFR - Abnormal; Notable for the following components:      Result Value   Sodium 127 (*)    Chloride 89 (*)    BUN 5 (*)    Calcium  8.8 (*)    All other components within normal limits  HEPATIC FUNCTION PANEL - Abnormal; Notable for the following components:   Albumin 3.2 (*)    AST 13 (*)    All other components within normal limits  CBC  BRAIN NATRIURETIC PEPTIDE  URINALYSIS, ROUTINE W REFLEX MICROSCOPIC      RADIOLOGY I have reviewed the us  personally and interpreted no evidence of DVT   PROCEDURES:  Critical Care performed: No  Procedures   MEDICATIONS ORDERED IN ED: Medications  oxyCODONE  (Oxy IR/ROXICODONE ) immediate release tablet 5 mg (5 mg Oral Given 12/21/23 1641)  acetaminophen  (TYLENOL ) tablet 1,000 mg (1,000 mg Oral Given 12/21/23 1641)  IMPRESSION / MDM / ASSESSMENT AND PLAN / ED COURSE  I reviewed the triage vital signs and the nursing notes.   Patient's presentation is most consistent with acute presentation with potential threat to life or bodily function.   Differential includes heart failure, cellulitis, DVT, liver failure.  BNP is normal no evidence of heart failure.  CBC without evidence of elevated white count.  Her BMP does show a low sodium which looks like a chronic issue for her and she is asymptomatic from it.  I reviewed patient's discharge summary when she had come in for COPD exacerbation on 12/09/2022.  They felt the low sodium was secondary to SIADH.  They recommend fluid restriction and sodium tablets.  Patient does report that she has also been on Lasix  to help with some swelling and she is not currently on any.  Given she does look a little fluid overloaded with her swelling we will trial 3 days of Lasix  but did discuss with her that this could lower the sodium level and so she developed any lightheadedness or dizziness to return to the ER for recheck.  But she does look  fluid overloaded so I suspect that some of the low sodium today could be related to extra fluid.  We also will prescribe some Keflex  as on examination she does look like she has cellulitis on her left leg.  Do not see any evidence of septic joint and she is a good distal pulse unlikely arterial issue  Did instruct patient not to use the oxycodone  at the same time as the Xanax  but she was requesting for something to help with pain at home.  The patient is on the cardiac monitor to evaluate for evidence of arrhythmia and/or significant heart rate changes.      FINAL CLINICAL IMPRESSION(S) / ED DIAGNOSES   Final diagnoses:  Cellulitis of left lower extremity  Hyponatremia  Edema, unspecified type     Rx / DC Orders   ED Discharge Orders          Ordered    furosemide  (LASIX ) 20 MG tablet  Daily        12/21/23 1746    cephALEXin  (KEFLEX ) 500 MG capsule  3 times daily        12/21/23 1746    oxyCODONE  (ROXICODONE ) 5 MG immediate release tablet  Every 6 hours PRN        12/21/23 1748             Note:  This document was prepared using Dragon voice recognition software and may include unintentional dictation errors.   Ernest Ronal BRAVO, MD 12/21/23 [phone]

## 2023-12-21 NOTE — Discharge Instructions (Addendum)
 Do not use the oxycodone  at the same time as your Xanax   Your sodium levels are low but this seems a chronic issue.  Given you do report worsening swelling we will try some Lasix  for 3 days to try to help with the swelling as well as put you on some antibiotics given I suspect you have a little bit of an infection on your left leg.  Do not see any evidence of DVT or arterial issue.  If develop chest pain, shortness of breath, weakness or any other concerns please return to the ER for repeat evaluation  You should try to follow-up with a recheck with your primary care doctor on Monday or Tuesday to recheck your potassium levels and return to the ER sooner with any other concerns  Take oxycodone  as prescribed. Do not drink alcohol, drive or participate in any other potentially dangerous activities while taking this medication as it may make you sleepy. Do not take this medication with any other sedating medications, either prescription or over-the-counter. If you were prescribed Percocet or Vicodin, do not take these with acetaminophen  (Tylenol ) as it is already contained within these medications.  This medication is an opiate (or narcotic) pain medication and can be habit forming. Use it as little as possible to achieve adequate pain control. Do not use or use it with extreme caution if you have a history of opiate abuse or dependence. If you are on a pain contract with your primary care doctor or a pain specialist, be sure to let them know you were prescribed this medication today from the Emergency Department. This medication is intended for your use only - do not give any to anyone else and keep it in a secure place where nobody else, especially children, have access to it.

## 2024-01-01 ENCOUNTER — Emergency Department
Admission: EM | Admit: 2024-01-01 | Discharge: 2024-01-01 | Disposition: A | Discharge status: Home / Self Care | Attending: Emergency Medicine | Admitting: Emergency Medicine

## 2024-01-01 ENCOUNTER — Encounter: Payer: Self-pay | Admitting: Emergency Medicine

## 2024-01-01 ENCOUNTER — Other Ambulatory Visit: Payer: Self-pay

## 2024-01-01 ENCOUNTER — Ambulatory Visit: Admitting: Family Medicine

## 2024-01-01 DIAGNOSIS — L03116 Cellulitis of left lower limb: Secondary | ICD-10-CM | POA: Insufficient documentation

## 2024-01-01 DIAGNOSIS — M79672 Pain in left foot: Secondary | ICD-10-CM | POA: Diagnosis present

## 2024-01-01 LAB — CBC WITH DIFFERENTIAL/PLATELET
Abs Immature Granulocytes: 0.02 K/uL (ref 0.00–0.07)
Basophils Absolute: 0.1 K/uL (ref 0.0–0.1)
Basophils Relative: 1 %
Eosinophils Absolute: 1.4 K/uL — ABNORMAL HIGH (ref 0.0–0.5)
Eosinophils Relative: 19 %
HCT: 38.3 % (ref 36.0–46.0)
Hemoglobin: 12.4 g/dL (ref 12.0–15.0)
Immature Granulocytes: 0 %
Lymphocytes Relative: 22 %
Lymphs Abs: 1.7 K/uL (ref 0.7–4.0)
MCH: 29.2 pg (ref 26.0–34.0)
MCHC: 32.4 g/dL (ref 30.0–36.0)
MCV: 90.3 fL (ref 80.0–100.0)
Monocytes Absolute: 0.6 K/uL (ref 0.1–1.0)
Monocytes Relative: 8 %
Neutro Abs: 3.9 K/uL (ref 1.7–7.7)
Neutrophils Relative %: 50 %
Platelets: 299 K/uL (ref 150–400)
RBC: 4.24 MIL/uL (ref 3.87–5.11)
RDW: 13.9 % (ref 11.5–15.5)
WBC: 7.7 K/uL (ref 4.0–10.5)
nRBC: 0 % (ref 0.0–0.2)

## 2024-01-01 LAB — COMPREHENSIVE METABOLIC PANEL WITH GFR
ALT: 9 U/L (ref 0–44)
AST: 14 U/L — ABNORMAL LOW (ref 15–41)
Albumin: 3.2 g/dL — ABNORMAL LOW (ref 3.5–5.0)
Alkaline Phosphatase: 65 U/L (ref 38–126)
Anion gap: 9 (ref 5–15)
BUN: 8 mg/dL (ref 8–23)
CO2: 29 mmol/L (ref 22–32)
Calcium: 8.7 mg/dL — ABNORMAL LOW (ref 8.9–10.3)
Chloride: 91 mmol/L — ABNORMAL LOW (ref 98–111)
Creatinine, Ser: 0.67 mg/dL (ref 0.44–1.00)
GFR, Estimated: >60 mL/min (ref >60)
Glucose, Bld: 93 mg/dL (ref 70–99)
Potassium: 4.1 mmol/L (ref 3.5–5.1)
Sodium: 129 mmol/L — ABNORMAL LOW (ref 135–145)
Total Bilirubin: 0.5 mg/dL (ref 0.0–1.2)
Total Protein: 6.8 g/dL (ref 6.5–8.1)

## 2024-01-01 LAB — LACTIC ACID, PLASMA: Lactic Acid, Venous: 1 mmol/L (ref 0.5–1.9)

## 2024-01-01 MED ORDER — CEPHALEXIN 500 MG PO CAPS
500.0000 mg | ORAL_CAPSULE | Freq: Three times a day (TID) | ORAL | 0 refills | Status: AC
Start: 2024-01-01 — End: 2024-01-08

## 2024-01-01 MED ORDER — NAPROXEN 500 MG PO TABS: 500.0000 mg | ORAL_TABLET | Freq: Two times a day (BID) | ORAL | 2 refills | Status: DC

## 2024-01-01 MED ORDER — SULFAMETHOXAZOLE-TRIMETHOPRIM 800-160 MG PO TABS
1.0000 | ORAL_TABLET | Freq: Once | ORAL | Status: AC
  Administered 2024-01-01: 1 via ORAL
  Filled 2024-01-01: qty 1

## 2024-01-01 MED ORDER — CEPHALEXIN 500 MG PO CAPS
500.0000 mg | ORAL_CAPSULE | Freq: Once | ORAL | Status: AC
  Administered 2024-01-01: 500 mg via ORAL
  Filled 2024-01-01: qty 1

## 2024-01-01 MED ORDER — NAPROXEN 500 MG PO TABS
500.0000 mg | ORAL_TABLET | Freq: Once | ORAL | Status: AC
  Administered 2024-01-01: 500 mg via ORAL
  Filled 2024-01-01: qty 1

## 2024-01-01 MED ORDER — SULFAMETHOXAZOLE-TRIMETHOPRIM 800-160 MG PO TABS: 1.0000 | ORAL_TABLET | Freq: Two times a day (BID) | ORAL | 0 refills | Status: DC

## 2024-01-01 NOTE — ED Triage Notes (Signed)
 Patient to ED via [facility] for left foot swelling and redness. Dx with cellulitis on 10/25- given antibiotics and was getting better until yesterday. PT also states she has a pull in right groin and painful to walk- ongoing x3 weeks. Was scheduled to have follow up at PCP today but PCP cancelled the appointment.

## 2024-01-01 NOTE — ED Provider Notes (Signed)
 [facility] Provider Note    Event Date/Time   First MD Initiated Contact with Patient 01/01/24 1103     (approximate)   History   Foot Pain   HPI  Nicole Austin is a 63 y.o. female with no history of diabetes who presents with complaints of burning to the top of her foot.  Patient was diagnosed with cellulitis on October 25 per review of records, was started on 3 times daily Keflex , she reports symptoms significantly improved on Keflex  but she finished her course 2 days ago and it seems to be worsening again.  No fevers or chills reported.     Physical Exam   Triage Vital Signs: ED Triage Vitals  Encounter Vitals Group     BP 01/01/24 1033 138/61     Girls Systolic BP Percentile --      Girls Diastolic BP Percentile --      Boys Systolic BP Percentile --      Boys Diastolic BP Percentile --      Pulse Rate 01/01/24 1033 77     Resp 01/01/24 1033 18     Temp 01/01/24 1033 98.4 F (36.9 C)     Temp Source 01/01/24 1033 Oral     SpO2 01/01/24 1033 95 %     Weight 01/01/24 1035 104.3 kg (230 lb)     Height 01/01/24 1035 1.549 m (5' 1)     Head Circumference --      Peak Flow --      Pain Score 01/01/24 1035 10     Pain Loc --      Pain Education --      Exclude from Growth Chart --     Most recent vital signs: Vitals:   01/01/24 1033  BP: 138/61  Pulse: 77  Resp: 18  Temp: 98.4 F (36.9 C)  SpO2: 95%     General: Awake, no distress.  CV:  Good peripheral perfusion.  Resp:  Normal effort.  Abd:  No distention.  Other:  Erythema to the top of the left foot consistent with cellulitis, no streaking, mild swelling noted, no skin breaks or areas of fluctuance.  Foot is warm and well-perfused   ED Results / Procedures / Treatments   Labs (all labs ordered are listed, but only abnormal results are displayed) Labs Reviewed  COMPREHENSIVE METABOLIC PANEL WITH GFR - Abnormal; Notable for the following components:      Result  Value   Sodium 129 (*)    Chloride 91 (*)    Calcium  8.7 (*)    Albumin 3.2 (*)    AST 14 (*)    All other components within normal limits  CBC WITH DIFFERENTIAL/PLATELET - Abnormal; Notable for the following components:   Eosinophils Absolute 1.4 (*)    All other components within normal limits  LACTIC ACID, PLASMA  LACTIC ACID, PLASMA     EKG     RADIOLOGY     PROCEDURES:  Critical Care performed:   Procedures   MEDICATIONS ORDERED IN ED: Medications  cephALEXin  (KEFLEX ) capsule 500 mg (has no administration in time range)  sulfamethoxazole -trimethoprim  (BACTRIM  DS) 800-160 MG per tablet 1 tablet (has no administration in time range)  naproxen  (NAPROSYN ) tablet 500 mg (has no administration in time range)     IMPRESSION / MDM / ASSESSMENT AND PLAN / ED COURSE  I reviewed the triage vital signs and the nursing notes. Patient's presentation is most consistent with acute  complicated illness / injury requiring diagnostic workup.  Patient presents with cellulitis as detailed above, did seem to improve on Keflex  but is worsening now that she is off.  Reviewing her records she does have a history of MRSA, no purulence or fluctuance to her symptoms today but will add Bactrim  coverage.  Lab work is quite reassuring, normal white blood cell count, lactic is normal.  Vitals normal.  No indication for admission  Strict return precautions close outpatient follow-up recommended, she agrees this plan        FINAL CLINICAL IMPRESSION(S) / ED DIAGNOSES   Final diagnoses:  Cellulitis of left lower extremity     Rx / DC Orders   ED Discharge Orders          Ordered    cephALEXin  (KEFLEX ) 500 MG capsule  3 times daily        01/01/24 1128    sulfamethoxazole -trimethoprim  (BACTRIM  DS) 800-160 MG tablet  2 times daily        01/01/24 1128    naproxen  (NAPROSYN ) 500 MG tablet  2 times daily with meals        01/01/24 1128             Note:  This document was  prepared using Dragon voice recognition software and may include unintentional dictation errors.   Arlander Charleston, MD 01/01/24 1139

## 2024-01-06 ENCOUNTER — Ambulatory Visit: Payer: Self-pay | Admitting: Family Medicine

## 2024-01-06 ENCOUNTER — Telehealth: Admitting: Nurse Practitioner

## 2024-01-06 DIAGNOSIS — L03115 Cellulitis of right lower limb: Secondary | ICD-10-CM

## 2024-01-06 DIAGNOSIS — L03116 Cellulitis of left lower limb: Secondary | ICD-10-CM | POA: Diagnosis not present

## 2024-01-06 NOTE — Telephone Encounter (Signed)
 FYI Only or Action Required?: FYI only for provider: appointment scheduled on 01/06/2024.  Patient was last seen in primary care on 09/06/2023 by Nicole Mole, PA-C.  Called Nurse Triage reporting Foot Swelling.  Symptoms began yesterday.  Triage Disposition: See Physician Within 24 Hours  Patient/caregiver understands and will follow disposition?: Yes            Copied from CRM #[id number]. Topic: Clinical - Red Word Triage >> Jan 06, 2024 10:55 AM Dedra B wrote: Red Word that prompted transfer to Nurse Triage: Pt was seen in ER 01/01/24 for L foot pain and swelling. Pt is now experiencing swelling in R foot that started yesterday. Warm transfer to NT. Reason for Disposition  [1] MODERATE leg swelling (e.g., swelling extends up to knees) AND [2] new-onset or getting worse  Answer Assessment - Initial Assessment Questions This RN scheduled pt for a virtual appointment today as pt does not have transportation.   Right foot swelling started yesterday  Pt states she is on 3 antibiotics Pt was in ED on 11/5 for left foot swelling (has remained the same as when pt was in hospital)  LOCATION: What part of the leg is swollen?  Are both legs swollen or just one leg?     Foot to calf  REDNESS: Is there redness or signs of infection?     Yes  PAIN: Is the swelling painful to touch? If Yes, ask: How painful is it?   (Scale 1-10; mild, moderate or severe)     9/10 pain constant  FEVER: Do you have a fever? If Yes, ask: What is it, how was it measured, and when did it start?      No  CAUSE: What do you think is causing the leg swelling?     No  OTHER SYMPTOMS: Do you have any other symptoms? (e.g., chest pain, difficulty breathing)       No  Protocols used: Leg Swelling and Edema-A-AH

## 2024-01-06 NOTE — Progress Notes (Signed)
 Name: Nicole Austin   MRN: [id number]    DOB: 1960/11/05   Date:01/06/2024       Progress Note  Subjective  Chief Complaint  Chief Complaint  Patient presents with   Edema    R foot, started yesterday    I connected with  Ilea Hilton Tippen  on 01/06/24 at 11:20 AM EST by a video enabled telemedicine application and verified that I am speaking with the correct person using two identifiers.  I discussed the limitations of evaluation and management by telemedicine and the availability of in person appointments. The patient expressed understanding and agreed to proceed with a virtual visit  Staff also discussed with the patient that there may be a patient responsible charge related to this service. Patient Location: home Provider Location: [facility] Additional Individuals present: alone  HPI   Discussed the use of AI scribe software for clinical note transcription with the patient, who gave verbal consent to proceed.  History of Present Illness Nicole Austin is a 63 year old female who presents with right foot swelling.  Right lower extremity swelling and erythema - Acute onset of significant right foot swelling beginning this morning - Swelling prevents wearing shoes or socks - Associated with redness extending up to the calf - Similar to previous episode affecting the left foot - No fever  Left lower extremity swelling and cellulitis - Recent episode of left lower extremity swelling - Evaluated in the emergency room on December 19, 2023, and January 01, 2024 - Diagnosed with cellulitis of the left lower leg - Currently taking Keflex  three times daily and Bactrim  twice daily for ongoing treatment - Course of antibiotics not yet completed - Naproxen  used for pain management - Bilateral lower extremity ultrasound on December 21, 2023, showed no evidence of blood clots  Adverse effects from diuretic therapy - Previously treated with Lasix  (furosemide ) - Discontinued  due to adverse effects described as making her feel sick    Patient Active Problem List   Diagnosis Date Noted   Moderately severe major depression (HCC) 08/07/2023   Bipolar affective disorder, current episode mixed (HCC) 03/15/2023   COPD exacerbation (HCC) 02/08/2023   COVID-19 virus infection 02/08/2023   S/P total left hip arthroplasty 10/09/2022   SIADH (syndrome of inappropriate ADH production) 07/09/2022   Iron  deficiency anemia 07/07/2022   History of methicillin resistant staphylococcus aureus (MRSA) 05/03/2022   Avascular necrosis of bones of both hips (HCC) 05/03/2022   Chronic prescription benzodiazepine use 05/03/2022   Polypharmacy 04/09/2022   Sinus bradycardia 04/09/2022   Depression with anxiety 04/09/2022   MRSA (methicillin resistant staph aureus) culture positive 05/04/2021   Prolonged QT interval 01/01/2021   Bipolar disorder with depression (HCC) 02/25/2017   ADD (attention deficit disorder) 02/25/2017   Marijuana use 02/13/2017   Prediabetes 05/20/2016   Neuropathy, peripheral 05/07/2016   Vitamin B12 deficiency 10/24/2015   Fibromyalgia 08/30/2015   OP (osteoporosis) 06/30/2015   Sleep apnea 06/30/2015   HLD (hyperlipidemia) 05/13/2015   Class 3 severe obesity with serious comorbidity and body mass index (BMI) of 45.0 to 49.9 in adult (HCC) 08/16/2014   Obesity, Class III, BMI 40-49.9 (morbid obesity) (HCC) 08/16/2014   Low back pain with sciatica 08/04/2014   Tobacco abuse 08/04/2014   Anxiety, generalized 11/24/2013   COPD (chronic obstructive pulmonary disease) (HCC) 11/18/2013   Osteoarthritis of knee, unspecified 07/15/2013    Social History   Tobacco Use   Smoking status: Former    Current  packs/day: 0.00    Average packs/day: 0.5 packs/day for 36.0 years (18.0 ttl pk-yrs)    Types: Cigarettes    Start date: 03/13/1986    Quit date: 03/13/2022    Years since quitting: 1.8    Passive exposure: Past   Smokeless tobacco: Never   Tobacco  comments:    Pt using nicotine  patches; 1/2 PPD as of 03/21/21  Substance Use Topics   Alcohol use: No    Alcohol/week: 0.0 standard drinks of alcohol     Current Outpatient Medications:    albuterol  (VENTOLIN  HFA) 108 (90 Base) MCG/ACT inhaler, INHALE TWO PUFFS INTO THE LUNGS EVERY SIX  HOURS AS NEEDED FOR WHEEZING OR SHORTNESS OF BREATH., Disp: 8.5 g, Rfl: 3   ALPRAZolam  (XANAX ) 0.5 MG tablet, Take 1 tablet (0.5 mg total) by mouth 2 (two) times daily. (Patient taking differently: Take 0.5 mg by mouth 2 (two) times daily. Patient stated psych increased to 3-4x/day), Disp: 10 tablet, Rfl: 0   cephALEXin  (KEFLEX ) 500 MG capsule, Take 1 capsule (500 mg total) by mouth 3 (three) times daily for 7 days., Disp: 21 capsule, Rfl: 0   cyanocobalamin  (VITAMIN B12) 1000 MCG tablet, Take 1 tablet (1,000 mcg total) by mouth daily., Disp: 90 tablet, Rfl: 1   divalproex  (DEPAKOTE ) 250 MG DR tablet, Take 1 tablet (250 mg total) by mouth 2 (two) times daily., Disp: 60 tablet, Rfl: 2   escitalopram  (LEXAPRO ) 20 MG tablet, Take 20 mg by mouth daily., Disp: , Rfl:    Iron , Ferrous Sulfate , 325 (65 Fe) MG TABS, Take 325 mg by mouth every other day., Disp: 45 tablet, Rfl: 1   magnesium  oxide (MAG-OX) 400 (240 Mg) MG tablet, TAKE ONE TABLET (400 MG TOTAL) BY MOUTH DAILY., Disp: 30 tablet, Rfl: 1   miconazole  (MICOTIN) 2 % powder, Apply topically 2 (two) times daily as needed for itching (Candidal intertrigo)., Disp: 85 g, Rfl: 2   mometasone -formoterol  (DULERA ) 100-5 MCG/ACT AERO, Inhale 2 puffs into the lungs 2 (two) times daily., Disp: 3 each, Rfl: 0   Multiple Vitamins-Minerals (MULTIVITAMIN WITH MINERALS) tablet, Take 1 tablet by mouth daily., Disp: , Rfl:    naproxen  (NAPROSYN ) 500 MG tablet, Take 1 tablet (500 mg total) by mouth 2 (two) times daily with a meal., Disp: 20 tablet, Rfl: 2   nystatin  (MYCOSTATIN /NYSTOP ) powder, Apply 1 Application topically 3 (three) times daily as needed (yeast/fungal rashes or  intertrigo)., Disp: 60 g, Rfl: 2   primidone (MYSOLINE) 50 MG tablet, Take 50 mg by mouth in the morning and at bedtime., Disp: , Rfl:    promethazine  (PHENERGAN ) 25 MG tablet, Take 25 mg by mouth every 6 (six) hours as needed for nausea or vomiting., Disp: , Rfl:    psyllium (METAMUCIL) 58.6 % powder, Take 1 packet by mouth as needed., Disp: , Rfl:    rOPINIRole  (REQUIP  XL) 4 MG 24 hr tablet, Take 4 mg by mouth at bedtime., Disp: , Rfl:    sulfamethoxazole -trimethoprim  (BACTRIM  DS) 800-160 MG tablet, Take 1 tablet by mouth 2 (two) times daily., Disp: 14 tablet, Rfl: 0   tiZANidine  (ZANAFLEX ) 2 MG tablet, TAKE ONE (1) TO TWO (2) TABLETS (2-4 MG TOTAL) BY MOUTH EVERY EIGHT HOURS AS NEEDED FOR MUSCLE SPASMS., Disp: 30 tablet, Rfl: 1   umeclidinium bromide  (INCRUSE ELLIPTA ) 62.5 MCG/ACT AEPB, Inhale 1 puff into the lungs daily., Disp: 90 each, Rfl: 0  Allergies  Allergen Reactions   Chantix  [Varenicline ] Nausea Only   Glycopyrrolate  Rash  Oral irritation    I personally reviewed active problem list, medication list, allergies, lab results, imaging with the patient/caregiver today.  ROS  Ten systems reviewed and is negative except as mentioned in HPI   Objective  Virtual encounter, vitals not obtained.  There is no height or weight on file to calculate BMI.  Nursing Note and Vital Signs reviewed.  Physical Exam  Awake, alert and oriented, speaking in complete sentences  Unable to visualize swelling on video  No results found. However, due to the size of the patient record, not all encounters were searched. Please check Results Review for a complete set of results.  Assessment & Plan  Assessment and Plan Assessment & Plan Cellulitis of bilateral lower extremities with outpatient antibiotic treatment failure Cellulitis initially diagnosed in the left lower extremity, now involving the right lower extremity. Despite treatment with Keflex , Bactrim , and naproxen , there is no  improvement and worsening of symptoms, indicating outpatient antibiotic treatment failure. Concern for potential progression to sepsis due to infection spreading to another limb. No fever reported, but significant swelling and redness present. - Advised return to the emergency room for evaluation and potential initiation of IV antibiotics due to outpatient treatment failure and risk of sepsis.      -Red flags and when to present for emergency care or RTC including fever >101.17F, chest pain, shortness of breath, new/worsening/un-resolving symptoms,  reviewed with patient at time of visit. Follow up and care instructions discussed and provided in AVS. - I discussed the assessment and treatment plan with the patient. The patient was provided an opportunity to ask questions and all were answered. The patient agreed with the plan and demonstrated an understanding of the instructions.  I provided 15 minutes of non-face-to-face time during this encounter.  Mliss JULIANNA Spray, FNP

## 2024-01-08 ENCOUNTER — Emergency Department

## 2024-01-08 ENCOUNTER — Other Ambulatory Visit: Payer: Self-pay

## 2024-01-08 ENCOUNTER — Inpatient Hospital Stay
Admission: EM | Admit: 2024-01-08 | Discharge: 2024-01-08 | DRG: 292 | Disposition: A | Discharge status: Home / Self Care | Attending: Internal Medicine | Admitting: Internal Medicine

## 2024-01-08 ENCOUNTER — Encounter: Payer: Self-pay | Admitting: Intensive Care

## 2024-01-08 DIAGNOSIS — Z96652 Presence of left artificial knee joint: Secondary | ICD-10-CM | POA: Diagnosis present

## 2024-01-08 DIAGNOSIS — E8770 Fluid overload, unspecified: Principal | ICD-10-CM

## 2024-01-08 DIAGNOSIS — Z87891 Personal history of nicotine dependence: Secondary | ICD-10-CM | POA: Diagnosis not present

## 2024-01-08 DIAGNOSIS — J9611 Chronic respiratory failure with hypoxia: Secondary | ICD-10-CM | POA: Diagnosis present

## 2024-01-08 DIAGNOSIS — Z6841 Body Mass Index (BMI) 40.0 and over, adult: Secondary | ICD-10-CM | POA: Diagnosis not present

## 2024-01-08 DIAGNOSIS — I5033 Acute on chronic diastolic (congestive) heart failure: Secondary | ICD-10-CM | POA: Insufficient documentation

## 2024-01-08 DIAGNOSIS — J441 Chronic obstructive pulmonary disease with (acute) exacerbation: Secondary | ICD-10-CM | POA: Diagnosis present

## 2024-01-08 DIAGNOSIS — Z8249 Family history of ischemic heart disease and other diseases of the circulatory system: Secondary | ICD-10-CM | POA: Diagnosis not present

## 2024-01-08 DIAGNOSIS — Z7951 Long term (current) use of inhaled steroids: Secondary | ICD-10-CM | POA: Diagnosis not present

## 2024-01-08 DIAGNOSIS — Z820 Family history of epilepsy and other diseases of the nervous system: Secondary | ICD-10-CM | POA: Diagnosis not present

## 2024-01-08 DIAGNOSIS — G20A1 Parkinson's disease without dyskinesia, without mention of fluctuations: Secondary | ICD-10-CM | POA: Diagnosis present

## 2024-01-08 DIAGNOSIS — Z888 Allergy status to other drugs, medicaments and biological substances status: Secondary | ICD-10-CM | POA: Diagnosis not present

## 2024-01-08 DIAGNOSIS — Z79899 Other long term (current) drug therapy: Secondary | ICD-10-CM | POA: Diagnosis not present

## 2024-01-08 DIAGNOSIS — Z5329 Procedure and treatment not carried out because of patient's decision for other reasons: Secondary | ICD-10-CM | POA: Diagnosis present

## 2024-01-08 DIAGNOSIS — E871 Hypo-osmolality and hyponatremia: Secondary | ICD-10-CM

## 2024-01-08 DIAGNOSIS — Z8673 Personal history of transient ischemic attack (TIA), and cerebral infarction without residual deficits: Secondary | ICD-10-CM | POA: Diagnosis not present

## 2024-01-08 DIAGNOSIS — F319 Bipolar disorder, unspecified: Secondary | ICD-10-CM | POA: Diagnosis present

## 2024-01-08 DIAGNOSIS — Z833 Family history of diabetes mellitus: Secondary | ICD-10-CM | POA: Diagnosis not present

## 2024-01-08 DIAGNOSIS — Z825 Family history of asthma and other chronic lower respiratory diseases: Secondary | ICD-10-CM | POA: Diagnosis not present

## 2024-01-08 DIAGNOSIS — I509 Heart failure, unspecified: Secondary | ICD-10-CM

## 2024-01-08 DIAGNOSIS — F4310 Post-traumatic stress disorder, unspecified: Secondary | ICD-10-CM | POA: Diagnosis present

## 2024-01-08 DIAGNOSIS — R600 Localized edema: Secondary | ICD-10-CM

## 2024-01-08 DIAGNOSIS — Z96642 Presence of left artificial hip joint: Secondary | ICD-10-CM | POA: Diagnosis present

## 2024-01-08 DIAGNOSIS — G2581 Restless legs syndrome: Secondary | ICD-10-CM | POA: Diagnosis present

## 2024-01-08 DIAGNOSIS — E785 Hyperlipidemia, unspecified: Secondary | ICD-10-CM | POA: Diagnosis present

## 2024-01-08 DIAGNOSIS — I252 Old myocardial infarction: Secondary | ICD-10-CM | POA: Diagnosis not present

## 2024-01-08 LAB — COMPREHENSIVE METABOLIC PANEL WITH GFR
ALT: 6 U/L (ref 0–44)
AST: 15 U/L (ref 15–41)
Albumin: 3.6 g/dL (ref 3.5–5.0)
Alkaline Phosphatase: 90 U/L (ref 38–126)
Anion gap: 7 (ref 5–15)
BUN: 10 mg/dL (ref 8–23)
CO2: 30 mmol/L (ref 22–32)
Calcium: 8.9 mg/dL (ref 8.9–10.3)
Chloride: 87 mmol/L — ABNORMAL LOW (ref 98–111)
Creatinine, Ser: 1.01 mg/dL — ABNORMAL HIGH (ref 0.44–1.00)
GFR, Estimated: >60 mL/min (ref >60)
Glucose, Bld: 93 mg/dL (ref 70–99)
Potassium: 4.9 mmol/L (ref 3.5–5.1)
Sodium: 123 mmol/L — ABNORMAL LOW (ref 135–145)
Total Bilirubin: 0.2 mg/dL (ref 0.0–1.2)
Total Protein: 6.2 g/dL — ABNORMAL LOW (ref 6.5–8.1)

## 2024-01-08 LAB — CBC WITH DIFFERENTIAL/PLATELET
Abs Immature Granulocytes: 0.01 K/uL (ref 0.00–0.07)
Basophils Absolute: 0.1 K/uL (ref 0.0–0.1)
Basophils Relative: 1 %
Eosinophils Absolute: 1 K/uL — ABNORMAL HIGH (ref 0.0–0.5)
Eosinophils Relative: 14 %
HCT: 39.3 % (ref 36.0–46.0)
Hemoglobin: 12.5 g/dL (ref 12.0–15.0)
Immature Granulocytes: 0 %
Lymphocytes Relative: 21 %
Lymphs Abs: 1.4 K/uL (ref 0.7–4.0)
MCH: 28.3 pg (ref 26.0–34.0)
MCHC: 31.8 g/dL (ref 30.0–36.0)
MCV: 89.1 fL (ref 80.0–100.0)
Monocytes Absolute: 0.5 K/uL (ref 0.1–1.0)
Monocytes Relative: 8 %
Neutro Abs: 3.8 K/uL (ref 1.7–7.7)
Neutrophils Relative %: 56 %
Platelets: 237 K/uL (ref 150–400)
RBC: 4.41 MIL/uL (ref 3.87–5.11)
RDW: 13.7 % (ref 11.5–15.5)
WBC: 6.7 K/uL (ref 4.0–10.5)
nRBC: 0 % (ref 0.0–0.2)

## 2024-01-08 LAB — URINALYSIS, ROUTINE W REFLEX MICROSCOPIC
Bilirubin Urine: NEGATIVE
Glucose, UA: NEGATIVE mg/dL
Hgb urine dipstick: NEGATIVE
Ketones, ur: NEGATIVE mg/dL
Leukocytes,Ua: NEGATIVE
Nitrite: NEGATIVE
Protein, ur: NEGATIVE mg/dL
Specific Gravity, Urine: 1.003 — ABNORMAL LOW (ref 1.005–1.030)
pH: 7 (ref 5.0–8.0)

## 2024-01-08 LAB — PRO BRAIN NATRIURETIC PEPTIDE: Pro Brain Natriuretic Peptide: 347 pg/mL — ABNORMAL HIGH (ref <300.0)

## 2024-01-08 MED ORDER — PREDNISONE 20 MG PO TABS: 40.0000 mg | ORAL_TABLET | Freq: Every day | ORAL | Status: DC

## 2024-01-08 MED ORDER — PRIMIDONE 50 MG PO TABS
50.0000 mg | ORAL_TABLET | Freq: Two times a day (BID) | ORAL | Status: DC
  Filled 2024-01-08: qty 1

## 2024-01-08 MED ORDER — PSYLLIUM 95 % PO PACK: 1.0000 | PACK | Freq: Every day | ORAL | Status: DC | PRN

## 2024-01-08 MED ORDER — HYDROCODONE-ACETAMINOPHEN 5-325 MG PO TABS
1.0000 | ORAL_TABLET | Freq: Once | ORAL | Status: AC
  Administered 2024-01-08: 1 via ORAL
  Filled 2024-01-08: qty 1

## 2024-01-08 MED ORDER — SODIUM CHLORIDE 0.9 % IV SOLN
250.0000 mL | INTRAVENOUS | Status: DC | PRN
Start: 2024-01-08 — End: 2024-01-08

## 2024-01-08 MED ORDER — ENOXAPARIN SODIUM 60 MG/0.6ML IJ SOSY: 50.0000 mg | PREFILLED_SYRINGE | INTRAMUSCULAR | Status: DC

## 2024-01-08 MED ORDER — ALPRAZOLAM 0.5 MG PO TABS
0.5000 mg | ORAL_TABLET | Freq: Two times a day (BID) | ORAL | Status: DC | PRN
Start: 2024-01-08 — End: 2024-01-08

## 2024-01-08 MED ORDER — DIVALPROEX SODIUM 250 MG PO DR TAB: 250.0000 mg | DELAYED_RELEASE_TABLET | Freq: Two times a day (BID) | ORAL | Status: DC

## 2024-01-08 MED ORDER — ESCITALOPRAM OXALATE 10 MG PO TABS: 20.0000 mg | ORAL_TABLET | Freq: Every day | ORAL | Status: DC

## 2024-01-08 MED ORDER — ALBUTEROL SULFATE (2.5 MG/3ML) 0.083% IN NEBU: 3.0000 mL | INHALATION_SOLUTION | RESPIRATORY_TRACT | Status: DC | PRN

## 2024-01-08 MED ORDER — METHYLPREDNISOLONE SODIUM SUCC 40 MG IJ SOLR
40.0000 mg | Freq: Two times a day (BID) | INTRAMUSCULAR | Status: DC
  Administered 2024-01-08: 40 mg via INTRAVENOUS
  Filled 2024-01-08: qty 1

## 2024-01-08 MED ORDER — SODIUM CHLORIDE 0.9% FLUSH
3.0000 mL | Freq: Two times a day (BID) | INTRAVENOUS | Status: DC
  Administered 2024-01-08: 3 mL via INTRAVENOUS

## 2024-01-08 MED ORDER — SODIUM CHLORIDE 0.9% FLUSH
3.0000 mL | INTRAVENOUS | Status: DC | PRN
Start: 2024-01-08 — End: 2024-01-08

## 2024-01-08 MED ORDER — FLUTICASONE FUROATE-VILANTEROL 100-25 MCG/ACT IN AEPB
1.0000 | INHALATION_SPRAY | Freq: Every day | RESPIRATORY_TRACT | Status: DC
  Filled 2024-01-08: qty 28

## 2024-01-08 MED ORDER — ACETAMINOPHEN 325 MG PO TABS: 650.0000 mg | ORAL_TABLET | ORAL | Status: DC | PRN

## 2024-01-08 MED ORDER — ROPINIROLE HCL ER 4 MG PO TB24
4.0000 mg | ORAL_TABLET | Freq: Every day | ORAL | Status: DC
  Filled 2024-01-08: qty 1

## 2024-01-08 MED ORDER — FUROSEMIDE 10 MG/ML IJ SOLN: 60.0000 mg | Freq: Two times a day (BID) | INTRAMUSCULAR | Status: DC

## 2024-01-08 MED ORDER — TIZANIDINE HCL 4 MG PO TABS
2.0000 mg | ORAL_TABLET | Freq: Three times a day (TID) | ORAL | Status: DC | PRN
Start: 2024-01-08 — End: 2024-01-08

## 2024-01-08 MED ORDER — PROMETHAZINE HCL 25 MG PO TABS: 25.0000 mg | ORAL_TABLET | Freq: Four times a day (QID) | ORAL | Status: DC | PRN

## 2024-01-08 MED ORDER — FUROSEMIDE 10 MG/ML IJ SOLN
60.0000 mg | Freq: Once | INTRAMUSCULAR | Status: AC
  Administered 2024-01-08: 60 mg via INTRAVENOUS
  Filled 2024-01-08: qty 8

## 2024-01-08 NOTE — H&P (Signed)
 History and Physical    Nicole Austin FMW:[id number] DOB: Mar 04, 1960 DOA: 01/08/2024  PCP: Leavy Mole, PA-C (Confirm with patient/family/NH records and if not entered, this has to be entered at [facility] point of entry) Patient coming from: Home  I have personally briefly reviewed patient's old medical records in [facility]  Chief Complaint: SOB, legs swelling  HPI: Nicole Austin is a 63 y.o. female with medical history significant of COPD, chronic hypoxic respiratory failure with 2 L as needed, OSA not on CPAP, bipolar disorder, morbid obesity, presented with worsening of shortness of breath wheezing and leg swelling.  Symptoms started 3 weeks ago, patient started to notice bilateral lower extremity swelling and pain, she came to ED 2 weeks ago, ED workup including a negative DVT study, and she was diagnosed with cellulitis and discharged home with p.o. antibiotic of Bactrim  and Keflex .  She took a dose of antibiotics for 7 days initially her symptoms started improving however she found the swelling and pain has become worse.  She also developed dry cough and wheezing and increasing shortness of breath in the last few days despite using more of her inhalers and home oxygen .  She denied any chest pain no fever or chills.  ED Course: Afebrile, nontachycardic blood pressure 140/80 O2 saturation 93% on room air.  Chest x-ray negative for acute infiltrates, blood work showed sodium 123 potassium 4.9 BUN 10 creatinine 1.0 WBC 6.7 hemoglobin 12.5 glucose 97.  Patient was given IV Lasix  40 mg x 1 in the ED.  Review of Systems: As per HPI otherwise 14 point review of systems negative.    Past Medical History:  Diagnosis Date   ADHD (attention deficit hyperactivity disorder)    Apnea, sleep 06/30/2015   Arthritis    Asthma    Asthma with acute exacerbation 06/30/2015   Benign neoplasm of ascending colon    Bipolar 1 disorder (HCC)    Chronic pain    COPD, moderate (HCC)  11/18/2013   Depression    DOE (dyspnea on exertion)    Fall 07/04/2022   GERD (gastroesophageal reflux disease) 08/30/2015   Gout 08/16/2014   History of acute myocardial infarction 06/30/2015   History of panic attacks    HPV (human papilloma virus) infection 07/17/2013   Hyperlipidemia    Left knee pain 07/06/2022   Low HDL (under 40) 08/30/2015   MI (myocardial infarction) (HCC) 2018   MRSA infection 2023   groin abscess   Nose colonized with MRSA 03/21/2022   a.) PCR (+) prior to LEFT THA   Osteoporosis    Parkinson disease (HCC)    Polyp of sigmoid colon    Pre-diabetes    Prediabetes 05/20/2016   PTSD (post-traumatic stress disorder)    Restless leg syndrome    Rhabdomyolysis    Sleep apnea 06/30/2015   Stage 3 severe COPD by GOLD classification (HCC) 11/18/2013   Stroke (HCC) 2018   right arm weakness   Traumatic rhabdomyolysis 07/04/2022   Tremors of nervous system    hands   Uncomplicated asthma 06/30/2015   Varicella 02/25/2017    Past Surgical History:  Procedure Laterality Date   BACK SURGERY     lumbar   CATARACT EXTRACTION W/PHACO Right 01/01/2019   Procedure: CATARACT EXTRACTION PHACO AND INTRAOCULAR LENS PLACEMENT (IOC) right vision blue;  Surgeon: Ferol Rogue, MD;  Location: [facility] ORS;  Service: Ophthalmology;  Laterality: Right;  US  00:39.4 CDE 5.49 Fluid Pack lot # [id number]   CATARACT  EXTRACTION W/PHACO Left 01/29/2019   Procedure: CATARACT EXTRACTION PHACO AND INTRAOCULAR LENS PLACEMENT (IOC) LEFT Vision Blue;  Surgeon: Ferol Rogue, MD;  Location: [facility] ORS;  Service: Ophthalmology;  Laterality: Left;  US  00:51.1 CDE 4.38 Fluid Pack Lot # [id number]   COLONOSCOPY WITH PROPOFOL  N/A 08/07/2017   Procedure: COLONOSCOPY WITH PROPOFOL ;  Surgeon: Janalyn Keene NOVAK, MD;  Location: [facility] ENDOSCOPY;  Service: Endoscopy;  Laterality: N/A;   HAND SURGERY Left    fractured with pins   REPLACEMENT TOTAL KNEE Left 2016   SPINAL CORD STIMULATOR INSERTION   2014   SPINAL CORD STIMULATOR REMOVAL  2014   TOTAL HIP ARTHROPLASTY Left 10/09/2022   Procedure: TOTAL HIP ARTHROPLASTY ANTERIOR APPROACH;  Surgeon: Ernie Cough, MD;  Location: [facility];  Service: Orthopedics;  Laterality: Left;   TUBAL LIGATION       reports that she has been smoking cigarettes. She started smoking about 37 years ago. She has a 18 pack-year smoking history. She has been exposed to tobacco smoke. She has never used smokeless tobacco. She reports current drug use. Drug: Marijuana. She reports that she does not drink alcohol.  Allergies  Allergen Reactions   Chantix  [Varenicline ] Nausea Only   Glycopyrrolate  Rash    Oral irritation    Family History  Problem Relation Age of Onset   Hernia Mother    Heart disease Mother    OCD Mother    Diabetes Mother    Parkinson's disease Father    Bipolar disorder Sister    Schizophrenia Sister    ADD / ADHD Sister    Alcohol abuse Brother    Bipolar disorder Sister    Paranoid behavior Sister    ADD / ADHD Sister    ADD / ADHD Son    Dementia Maternal Grandmother    Emphysema Maternal Grandfather    ADD / ADHD Son    ADD / ADHD Son    Depression Son      Prior to Admission medications   Medication Sig Start Date End Date Taking? Authorizing Provider  albuterol  (VENTOLIN  HFA) 108 (90 Base) MCG/ACT inhaler INHALE TWO PUFFS INTO THE LUNGS EVERY SIX  HOURS AS NEEDED FOR WHEEZING OR SHORTNESS OF BREATH. 08/30/23   Tapia, [name], PA-C  ALPRAZolam  (XANAX ) 0.5 MG tablet Take 1 tablet (0.5 mg total) by mouth 2 (two) times daily. Patient taking differently: Take 0.5 mg by mouth 2 (two) times daily. Patient stated psych increased to 3-4x/day 07/10/22   Josette Ade, MD  cephALEXin  (KEFLEX ) 500 MG capsule Take 1 capsule (500 mg total) by mouth 3 (three) times daily for 7 days. 01/01/24 01/08/24  Arlander Charleston, MD  cyanocobalamin  (VITAMIN B12) 1000 MCG tablet Take 1 tablet (1,000 mcg total) by mouth daily. 08/07/23   Tapia, [name],  PA-C  divalproex  (DEPAKOTE ) 250 MG DR tablet Take 1 tablet (250 mg total) by mouth 2 (two) times daily. 11/19/16   Mohammed Goldstein, MD  escitalopram  (LEXAPRO ) 20 MG tablet Take 20 mg by mouth daily.    [provider]  Iron , Ferrous Sulfate , 325 (65 Fe) MG TABS Take 325 mg by mouth every other day. 08/07/23   Tapia, [name], PA-C  magnesium  oxide (MAG-OX) 400 (240 Mg) MG tablet TAKE ONE TABLET (400 MG TOTAL) BY MOUTH DAILY. 10/17/23   Tapia, [name], PA-C  miconazole  (MICOTIN) 2 % powder Apply topically 2 (two) times daily as needed for itching (Candidal intertrigo). 04/22/23   Tapia, [name], PA-C  mometasone -formoterol  (DULERA ) 100-5 MCG/ACT AERO Inhale  2 puffs into the lungs 2 (two) times daily. 02/11/23 01/06/24  [place], Eric J, DO  Multiple Vitamins-Minerals (MULTIVITAMIN WITH MINERALS) tablet Take 1 tablet by mouth daily.    [provider]  naproxen  (NAPROSYN ) 500 MG tablet Take 1 tablet (500 mg total) by mouth 2 (two) times daily with a meal. 01/01/24   Arlander Charleston, MD  nystatin  (MYCOSTATIN /NYSTOP ) powder Apply 1 Application topically 3 (three) times daily as needed (yeast/fungal rashes or intertrigo). 07/02/23   Tapia, [name], PA-C  primidone (MYSOLINE) 50 MG tablet Take 50 mg by mouth in the morning and at bedtime.    [provider]  promethazine  (PHENERGAN ) 25 MG tablet Take 25 mg by mouth every 6 (six) hours as needed for nausea or vomiting.    [provider]  psyllium (METAMUCIL) 58.6 % powder Take 1 packet by mouth as needed.    [provider]  rOPINIRole  (REQUIP  XL) 4 MG 24 hr tablet Take 4 mg by mouth at bedtime.    [provider]  sulfamethoxazole -trimethoprim  (BACTRIM  DS) 800-160 MG tablet Take 1 tablet by mouth 2 (two) times daily. 01/01/24   Arlander Charleston, MD  tiZANidine  (ZANAFLEX ) 2 MG tablet TAKE ONE (1) TO TWO (2) TABLETS (2-4 MG TOTAL) BY MOUTH EVERY EIGHT HOURS AS NEEDED FOR MUSCLE SPASMS. 10/25/23   Tapia, [name], PA-C  umeclidinium  bromide (INCRUSE ELLIPTA ) 62.5 MCG/ACT AEPB Inhale 1 puff into the lungs daily. 02/11/23   [place], Eric J, DO    Physical Exam: Vitals:   01/08/24 1031  BP: (!) 141/79  Pulse: 65  Resp: 20  Temp: 98 F (36.7 C)  TempSrc: Oral  SpO2: 93%  Weight: 106.6 kg  Height: 5' 1 (1.549 m)    Constitutional: NAD, calm, comfortable Vitals:   01/08/24 1031  BP: (!) 141/79  Pulse: 65  Resp: 20  Temp: 98 F (36.7 C)  TempSrc: Oral  SpO2: 93%  Weight: 106.6 kg  Height: 5' 1 (1.549 m)   Eyes: PERRL, lids and conjunctivae normal ENMT: Mucous membranes are moist. Posterior pharynx clear of any exudate or lesions.Normal dentition.  Neck: normal, supple, no masses, no thyromegaly Respiratory: clear to auscultation bilaterally, diffused wheezing, scattered crackles bilaterally, increasing respiratory effort. No accessory muscle use.  Cardiovascular: Regular rate and rhythm, no murmurs / rubs / gallops.  Anasarca to bilateral mid thighs. 2+ pedal pulses. No carotid bruits.  Abdomen: no tenderness, no masses palpated. No hepatosplenomegaly. Bowel sounds positive.  Musculoskeletal: no clubbing / cyanosis. No joint deformity upper and lower extremities. Good ROM, no contractures. Normal muscle tone.  Skin: no rashes, lesions, ulcers. No induration Neurologic: CN 2-12 grossly intact. Sensation intact, DTR normal. Strength 5/5 in all 4.  Psychiatric: Normal judgment and insight. Alert and oriented x 3. Normal mood.     Labs on Admission: I have personally reviewed following labs and imaging studies  CBC: Recent Labs  Lab 01/08/24 1034  WBC 6.7  NEUTROABS 3.8  HGB 12.5  HCT 39.3  MCV 89.1  PLT 237   Basic Metabolic Panel: Recent Labs  Lab 01/08/24 1034  NA 123*  K 4.9  CL 87*  CO2 30  GLUCOSE 93  BUN 10  CREATININE 1.01*  CALCIUM  8.9   GFR: Estimated Creatinine Clearance: 63.3 mL/min (A) (by C-G formula based on SCr of 1.01 mg/dL (H)). Liver Function Tests: Recent Labs   Lab 01/08/24 1034  AST 15  ALT 6  ALKPHOS 90  BILITOT 0.2  PROT 6.2*  ALBUMIN 3.6   No results for input(s): LIPASE, AMYLASE in the last 168 hours. No results for input(s): AMMONIA in the last 168 hours. Coagulation Profile: No results for input(s): INR, PROTIME in the last 168 hours. Cardiac Enzymes: No results for input(s): CKTOTAL, CKMB, CKMBINDEX, TROPONINI in the last 168 hours. BNP (last 3 results) Recent Labs    01/08/24 1034  PROBNP 347.0*   HbA1C: No results for input(s): HGBA1C in the last 72 hours. CBG: No results for input(s): GLUCAP in the last 168 hours. Lipid Profile: No results for input(s): CHOL, HDL, LDLCALC, TRIG, CHOLHDL, LDLDIRECT in the last 72 hours. Thyroid  Function Tests: No results for input(s): TSH, T4TOTAL, FREET4, T3FREE, THYROIDAB in the last 72 hours. Anemia Panel: No results for input(s): VITAMINB12, FOLATE, FERRITIN, TIBC, IRON , RETICCTPCT in the last 72 hours. Urine analysis:    Component Value Date/Time   COLORURINE COLORLESS (A) 01/08/2024 1242   APPEARANCEUR CLEAR (A) 01/08/2024 1242   APPEARANCEUR Clear 06/15/2014 0241   LABSPEC 1.003 (L) 01/08/2024 1242   LABSPEC 1.024 06/15/2014 0241   PHURINE 7.0 01/08/2024 1242   GLUCOSEU NEGATIVE 01/08/2024 1242   GLUCOSEU Negative 06/15/2014 0241   HGBUR NEGATIVE 01/08/2024 1242   BILIRUBINUR NEGATIVE 01/08/2024 1242   BILIRUBINUR Negative 06/15/2014 0241   KETONESUR NEGATIVE 01/08/2024 1242   PROTEINUR NEGATIVE 01/08/2024 1242   NITRITE NEGATIVE 01/08/2024 1242   LEUKOCYTESUR NEGATIVE 01/08/2024 1242   LEUKOCYTESUR Negative 06/15/2014 0241    Radiological Exams on Admission: DG Chest Portable 1 View Result Date: 01/08/2024 CLINICAL DATA:  Shortness of breath. EXAM: PORTABLE CHEST 1 VIEW COMPARISON:  02/08/2023 FINDINGS: Lungs are adequately inflated with minimal hazy density over the lateral left base possibly due to overlying  soft tissues although atelectasis or early infection is possible. Cardiomediastinal silhouette and remainder of the exam is unchanged. IMPRESSION: Minimal hazy density over the lateral left base which may be due to overlying soft tissues, although atelectasis or early infection is possible. Consider PA and lateral chest radiograph for better evaluation. Electronically Signed   By: Toribio Agreste M.D.   On: 01/08/2024 12:45    EKG: Ordered  Assessment/Plan Principal Problem:   CHF (congestive heart failure) (HCC) Active Problems:   Acute on chronic diastolic CHF (congestive heart failure) (HCC)  (please populate well all problems here in Problem List. (For example, if patient is on BP meds at home and you resume or decide to hold them, it is a problem that needs to be her. Same for CAD, COPD, HLD and so on)  Anasarca Acute on chronic HFpEF decompensation - Clinically suspect right-sided heart failure secondary to poorly controlled COPD and/or OSA - Continue IV diuresis Lasix  60 mg twice daily - Echocardiogram - Other DDx, recent DVT study from less than 2 weeks ago reviewed.  No need to repeat lower extremity venous Doppler.  Acute COPD exacerbation - IV Solu-Medrol  - Continue ICS and LABA - DuoNebs and as needed albuterol  - Incentive spirometry and flutter valve  Hyponatremia - With hypervolemic secondary to CHF decompensation, fluid overload and dilutional changes - IV diuresis - Fluid restriction  Morbid obesity - BMI= 44 - Outpatient GLP-1 agonist therapy evaluation  Restless leg syndrome - Continue ropinirole    DVT prophylaxis: Lovenox  Code Status: Full code Family Communication: None at bedside Disposition Plan: Patient is sick with new onset of CHF decompensation requiring aggressive IV diuresis and monitor kidney function changes, expect more than 2 midnight hospital stay Consults called: None Admission status: Telemetry admission  Cort ONEIDA Mana MD [facility] Pager [phone]  01/08/2024, 2:45 PM

## 2024-01-08 NOTE — ED Provider Notes (Signed)
 [facility] Provider Note    Event Date/Time   First MD Initiated Contact with Patient 01/08/24 1116     (approximate)  History   Chief Complaint: Cellulitis  HPI  Nicole Austin is a 63 y.o. female with a past medical history of arthritis, COPD, gastric reflux, hyperlipidemia, presents to the emergency department for bilateral leg swelling and pain.  According to the patient over the past month she has had progressive worsening of swelling and pain in her bilateral lower extremities.  Patient was seen for the same approximately 2 weeks ago at that time patient had ultrasounds performed of her lower extremities showing no DVT.  Patient states the swelling has continued to worsen to the point where she is having significant pain in her legs and is having trouble ambulating.  Patient states also shortness of breath at times especially with exertion.  Patient denies any history of CHF.  Denies any diuretics.  Physical Exam   Triage Vital Signs: ED Triage Vitals [01/08/24 1031]  Encounter Vitals Group     BP (!) 141/79     Girls Systolic BP Percentile      Girls Diastolic BP Percentile      Boys Systolic BP Percentile      Boys Diastolic BP Percentile      Pulse Rate 65     Resp 20     Temp 98 F (36.7 C)     Temp Source Oral     SpO2 93 %     Weight 235 lb (106.6 kg)     Height 5' 1 (1.549 m)     Head Circumference      Peak Flow      Pain Score 9     Pain Loc      Pain Education      Exclude from Growth Chart     Most recent vital signs: Vitals:   01/08/24 1031  BP: (!) 141/79  Pulse: 65  Resp: 20  Temp: 98 F (36.7 C)  SpO2: 93%    General: Awake, no distress.  CV:  Good peripheral perfusion.  Regular rate and rhythm  Resp:  Normal effort.  Equal breath sounds bilaterally.  Mild wheezing bilaterally. Abd:  No distention.  Soft, nontender.  No rebound or guarding. Other:  Patient has 3+ lower extremity edema bilaterally with  tenderness to palpation throughout.   ED Results / Procedures / Treatments   RADIOLOGY  I reviewed interpret the chest x-ray images.  No obvious consolidation on my evaluation. Radiology is read minimal density over the lateral left base could represent atelectasis versus early infection.  MEDICATIONS ORDERED IN ED: Medications  furosemide  (LASIX ) injection 60 mg (has no administration in time range)     IMPRESSION / MDM / ASSESSMENT AND PLAN / ED COURSE  I reviewed the triage vital signs and the nursing notes.  Patient's presentation is most consistent with acute presentation with potential threat to life or bodily function.  Patient presents emergency department for worsening lower extremity pain and swelling.  Symptoms have been worsening over the past 1 month.  Patient states prior to 1 month she has never had swelling or really substantial swelling in the legs.  Patient was seen for the same recently with negative ultrasounds.  This is the patient's third visit in approximately 2 to 3 weeks for the same.  She has completed a course of antibiotics.  Given the patient's worsening edema highly suspect more of  a CHF type picture.  Patient's lab work today shows significant hyponatremia with a sodium of 123.  Patient baseline sodium appears to be closer to 130.  Given the patient's hyponatremia with significant lower extremity edema now on her third ER visit in a couple weeks we will admit for IV diuresis.  We obtain a chest x-ray to rule out pulmonary edema.  Chest x-ray negative for any significant pulmonary edema.  Patient mated to the hospital service for IV diuresis and hyponatremia.  Patient agreeable to plan.  FINAL CLINICAL IMPRESSION(S) / ED DIAGNOSES   Lower extremity edema Fluid overload/peripheral edema Hyponatremia  Note:  This document was prepared using Dragon voice recognition software and may include unintentional dictation errors.   Dorothyann Drivers, MD 01/08/24  1311

## 2024-01-08 NOTE — ED Notes (Addendum)
 Dr Laurita notified through secure chat: pt is stating she can't stay because she has a special needs cat at home and couldn't find arrangements for someone to take care of it. I told ED provider earlier and he came to talk to her and she agreed to stay but now is stating she cannot.  Dr Laurita responded: She has to sign AMA then.  Pt educated on the risks of leaving by ED provider and pt verbalized understanding and is still wanting to leave. Pt signed paper AMA form which was put in to the ED pt forms bin to be scanned into chart.

## 2024-01-08 NOTE — ED Notes (Signed)
 CCMD was called to put pt on cardiac monitoring

## 2024-01-08 NOTE — ED Notes (Signed)
 Pt wheezing at this time and PRN albuterol  treatment is ordered. Pt states she does not feel SOB and does not need a treatment because she had one this morning.

## 2024-01-08 NOTE — ED Triage Notes (Signed)
 C/o lower bilateral leg swelling. Reports diagnosed with cellulitis a few weeks ago and still having swelling and redness

## 2024-01-08 NOTE — ED Notes (Signed)
 Pt discharged with [facility], pts neighbor and friend

## 2024-01-10 ENCOUNTER — Inpatient Hospital Stay
Admission: EM | Admit: 2024-01-10 | Discharge: 2024-01-13 | DRG: 291 | Disposition: A | Discharge status: Home / Self Care | Attending: Emergency Medicine | Admitting: Emergency Medicine

## 2024-01-10 ENCOUNTER — Other Ambulatory Visit: Payer: Self-pay

## 2024-01-10 ENCOUNTER — Emergency Department

## 2024-01-10 DIAGNOSIS — Z833 Family history of diabetes mellitus: Secondary | ICD-10-CM

## 2024-01-10 DIAGNOSIS — Z8249 Family history of ischemic heart disease and other diseases of the circulatory system: Secondary | ICD-10-CM

## 2024-01-10 DIAGNOSIS — G4733 Obstructive sleep apnea (adult) (pediatric): Secondary | ICD-10-CM | POA: Diagnosis present

## 2024-01-10 DIAGNOSIS — Z8673 Personal history of transient ischemic attack (TIA), and cerebral infarction without residual deficits: Secondary | ICD-10-CM

## 2024-01-10 DIAGNOSIS — E785 Hyperlipidemia, unspecified: Secondary | ICD-10-CM | POA: Diagnosis present

## 2024-01-10 DIAGNOSIS — Z96652 Presence of left artificial knee joint: Secondary | ICD-10-CM | POA: Diagnosis present

## 2024-01-10 DIAGNOSIS — F319 Bipolar disorder, unspecified: Secondary | ICD-10-CM | POA: Diagnosis present

## 2024-01-10 DIAGNOSIS — J9621 Acute and chronic respiratory failure with hypoxia: Secondary | ICD-10-CM | POA: Diagnosis present

## 2024-01-10 DIAGNOSIS — Z1152 Encounter for screening for COVID-19: Secondary | ICD-10-CM

## 2024-01-10 DIAGNOSIS — Z825 Family history of asthma and other chronic lower respiratory diseases: Secondary | ICD-10-CM

## 2024-01-10 DIAGNOSIS — Z820 Family history of epilepsy and other diseases of the nervous system: Secondary | ICD-10-CM

## 2024-01-10 DIAGNOSIS — G20A1 Parkinson's disease without dyskinesia, without mention of fluctuations: Secondary | ICD-10-CM | POA: Diagnosis present

## 2024-01-10 DIAGNOSIS — Z9842 Cataract extraction status, left eye: Secondary | ICD-10-CM

## 2024-01-10 DIAGNOSIS — I5033 Acute on chronic diastolic (congestive) heart failure: Principal | ICD-10-CM | POA: Diagnosis present

## 2024-01-10 DIAGNOSIS — Z79899 Other long term (current) drug therapy: Secondary | ICD-10-CM

## 2024-01-10 DIAGNOSIS — R0602 Shortness of breath: Secondary | ICD-10-CM | POA: Diagnosis not present

## 2024-01-10 DIAGNOSIS — I252 Old myocardial infarction: Secondary | ICD-10-CM

## 2024-01-10 DIAGNOSIS — Z961 Presence of intraocular lens: Secondary | ICD-10-CM | POA: Diagnosis present

## 2024-01-10 DIAGNOSIS — F909 Attention-deficit hyperactivity disorder, unspecified type: Secondary | ICD-10-CM | POA: Diagnosis present

## 2024-01-10 DIAGNOSIS — Z96642 Presence of left artificial hip joint: Secondary | ICD-10-CM | POA: Diagnosis present

## 2024-01-10 DIAGNOSIS — K219 Gastro-esophageal reflux disease without esophagitis: Secondary | ICD-10-CM | POA: Diagnosis present

## 2024-01-10 DIAGNOSIS — Z7951 Long term (current) use of inhaled steroids: Secondary | ICD-10-CM

## 2024-01-10 DIAGNOSIS — Z9841 Cataract extraction status, right eye: Secondary | ICD-10-CM

## 2024-01-10 DIAGNOSIS — F17210 Nicotine dependence, cigarettes, uncomplicated: Secondary | ICD-10-CM | POA: Diagnosis present

## 2024-01-10 DIAGNOSIS — Z888 Allergy status to other drugs, medicaments and biological substances status: Secondary | ICD-10-CM

## 2024-01-10 DIAGNOSIS — F4310 Post-traumatic stress disorder, unspecified: Secondary | ICD-10-CM | POA: Diagnosis present

## 2024-01-10 DIAGNOSIS — M810 Age-related osteoporosis without current pathological fracture: Secondary | ICD-10-CM | POA: Diagnosis present

## 2024-01-10 DIAGNOSIS — J441 Chronic obstructive pulmonary disease with (acute) exacerbation: Principal | ICD-10-CM | POA: Diagnosis present

## 2024-01-10 DIAGNOSIS — G2581 Restless legs syndrome: Secondary | ICD-10-CM | POA: Diagnosis present

## 2024-01-10 DIAGNOSIS — Z716 Tobacco abuse counseling: Secondary | ICD-10-CM

## 2024-01-10 DIAGNOSIS — I5031 Acute diastolic (congestive) heart failure: Secondary | ICD-10-CM | POA: Diagnosis present

## 2024-01-10 DIAGNOSIS — Z818 Family history of other mental and behavioral disorders: Secondary | ICD-10-CM

## 2024-01-10 DIAGNOSIS — E222 Syndrome of inappropriate secretion of antidiuretic hormone: Secondary | ICD-10-CM | POA: Diagnosis present

## 2024-01-10 LAB — BASIC METABOLIC PANEL WITH GFR
Anion gap: 11 (ref 5–15)
BUN: 8 mg/dL (ref 8–23)
CO2: 27 mmol/L (ref 22–32)
Calcium: 9.4 mg/dL (ref 8.9–10.3)
Chloride: 88 mmol/L — ABNORMAL LOW (ref 98–111)
Creatinine, Ser: 0.88 mg/dL (ref 0.44–1.00)
GFR, Estimated: >60 mL/min (ref >60)
Glucose, Bld: 86 mg/dL (ref 70–99)
Potassium: 3.6 mmol/L (ref 3.5–5.1)
Sodium: 126 mmol/L — ABNORMAL LOW (ref 135–145)

## 2024-01-10 LAB — PRO BRAIN NATRIURETIC PEPTIDE: Pro Brain Natriuretic Peptide: 636 pg/mL — ABNORMAL HIGH (ref <300.0)

## 2024-01-10 LAB — CBC
HCT: 40.2 % (ref 36.0–46.0)
Hemoglobin: 13.2 g/dL (ref 12.0–15.0)
MCH: 29.1 pg (ref 26.0–34.0)
MCHC: 32.8 g/dL (ref 30.0–36.0)
MCV: 88.5 fL (ref 80.0–100.0)
Platelets: 242 K/uL (ref 150–400)
RBC: 4.54 MIL/uL (ref 3.87–5.11)
RDW: 13.8 % (ref 11.5–15.5)
WBC: 6.7 K/uL (ref 4.0–10.5)
nRBC: 0 % (ref 0.0–0.2)

## 2024-01-10 LAB — TROPONIN T, HIGH SENSITIVITY
Troponin T High Sensitivity: <15 ng/L (ref 0–19)
Troponin T High Sensitivity: <15 ng/L (ref 0–19)

## 2024-01-10 MED ORDER — ROPINIROLE HCL ER 4 MG PO TB24
4.0000 mg | ORAL_TABLET | Freq: Every day | ORAL | Status: DC
  Administered 2024-01-10 – 2024-01-12 (×3): 4 mg via ORAL
  Filled 2024-01-10 (×3): qty 1

## 2024-01-10 MED ORDER — FERROUS SULFATE 325 (65 FE) MG PO TABS
325.0000 mg | ORAL_TABLET | ORAL | Status: DC
  Administered 2024-01-11 – 2024-01-13 (×2): 325 mg via ORAL
  Filled 2024-01-10 (×2): qty 1

## 2024-01-10 MED ORDER — ALPRAZOLAM 0.5 MG PO TABS
0.5000 mg | ORAL_TABLET | Freq: Three times a day (TID) | ORAL | Status: DC
  Administered 2024-01-10 – 2024-01-13 (×8): 0.5 mg via ORAL
  Filled 2024-01-10 (×8): qty 1

## 2024-01-10 MED ORDER — ACETAMINOPHEN 325 MG PO TABS: 650.0000 mg | ORAL_TABLET | Freq: Four times a day (QID) | ORAL | Status: DC | PRN

## 2024-01-10 MED ORDER — VITAMIN B-12 1000 MCG PO TABS
1000.0000 ug | ORAL_TABLET | Freq: Every day | ORAL | Status: DC
  Administered 2024-01-11 – 2024-01-13 (×3): 1000 ug via ORAL
  Filled 2024-01-10: qty 1
  Filled 2024-01-10: qty 2

## 2024-01-10 MED ORDER — IPRATROPIUM-ALBUTEROL 0.5-2.5 (3) MG/3ML IN SOLN
6.0000 mL | Freq: Once | RESPIRATORY_TRACT | Status: AC
  Administered 2024-01-10: 6 mL via RESPIRATORY_TRACT
  Filled 2024-01-10: qty 6

## 2024-01-10 MED ORDER — MORPHINE SULFATE (PF) 2 MG/ML IV SOLN
2.0000 mg | INTRAVENOUS | Status: DC | PRN
  Administered 2024-01-10 – 2024-01-13 (×20): 2 mg via INTRAVENOUS
  Filled 2024-01-10 (×20): qty 1

## 2024-01-10 MED ORDER — ACETAMINOPHEN 650 MG RE SUPP: 650.0000 mg | Freq: Four times a day (QID) | RECTAL | Status: DC | PRN

## 2024-01-10 MED ORDER — ENOXAPARIN SODIUM 60 MG/0.6ML IJ SOSY
50.0000 mg | PREFILLED_SYRINGE | INTRAMUSCULAR | Status: DC
  Administered 2024-01-10 – 2024-01-12 (×3): 50 mg via SUBCUTANEOUS
  Filled 2024-01-10 (×3): qty 0.6

## 2024-01-10 MED ORDER — METHYLPREDNISOLONE SODIUM SUCC 40 MG IJ SOLR
40.0000 mg | Freq: Every day | INTRAMUSCULAR | Status: DC
  Administered 2024-01-11 – 2024-01-13 (×3): 40 mg via INTRAVENOUS
  Filled 2024-01-10 (×3): qty 1

## 2024-01-10 MED ORDER — FUROSEMIDE 10 MG/ML IJ SOLN
60.0000 mg | Freq: Once | INTRAMUSCULAR | Status: AC
  Administered 2024-01-10: 60 mg via INTRAVENOUS
  Filled 2024-01-10: qty 8

## 2024-01-10 MED ORDER — DIVALPROEX SODIUM 250 MG PO DR TAB
250.0000 mg | DELAYED_RELEASE_TABLET | Freq: Two times a day (BID) | ORAL | Status: DC
  Administered 2024-01-10 – 2024-01-13 (×6): 250 mg via ORAL
  Filled 2024-01-10 (×6): qty 1

## 2024-01-10 MED ORDER — FUROSEMIDE 10 MG/ML IJ SOLN: 60.0000 mg | Freq: Every day | INTRAMUSCULAR | Status: DC

## 2024-01-10 MED ORDER — IPRATROPIUM-ALBUTEROL 0.5-2.5 (3) MG/3ML IN SOLN
3.0000 mL | Freq: Four times a day (QID) | RESPIRATORY_TRACT | Status: DC
  Administered 2024-01-10 – 2024-01-12 (×8): 3 mL via RESPIRATORY_TRACT
  Filled 2024-01-10 (×9): qty 3

## 2024-01-10 MED ORDER — TRAZODONE HCL 50 MG PO TABS: 25.0000 mg | ORAL_TABLET | Freq: Every evening | ORAL | Status: DC | PRN

## 2024-01-10 MED ORDER — PRIMIDONE 50 MG PO TABS
50.0000 mg | ORAL_TABLET | Freq: Every day | ORAL | Status: DC
  Administered 2024-01-10 – 2024-01-12 (×3): 50 mg via ORAL
  Filled 2024-01-10 (×3): qty 1

## 2024-01-10 MED ORDER — PSYLLIUM 95 % PO PACK
1.0000 | PACK | Freq: Every day | ORAL | Status: DC
  Administered 2024-01-12: 1 via ORAL
  Filled 2024-01-10 (×3): qty 1

## 2024-01-10 MED ORDER — METHYLPREDNISOLONE SODIUM SUCC 40 MG IJ SOLR
40.0000 mg | Freq: Once | INTRAMUSCULAR | Status: AC
  Administered 2024-01-10: 40 mg via INTRAVENOUS
  Filled 2024-01-10: qty 1

## 2024-01-10 NOTE — ED Provider Notes (Signed)
 [facility] Provider Note    Event Date/Time   First MD Initiated Contact with Patient 01/10/24 1513     (approximate)   History   Shortness of Breath   HPI  Nicole Austin is a 63 y.o. female who presents to the ED for evaluation of Shortness of Breath   I reviewed ED visit and H&P from 2 days ago.  History of COPD, chronic 2 L, morbid obesity and OSA on CPAP.  Was to be admitted for COPD/CHF exacerbation and had to leave to arrange care for a pet.  Patient returns to the ED after arranging care for her special needs pet.  She reports continued shortness of breath   Physical Exam   Triage Vital Signs: ED Triage Vitals [01/10/24 1212]  Encounter Vitals Group     BP (!) 119/90     Girls Systolic BP Percentile      Girls Diastolic BP Percentile      Boys Systolic BP Percentile      Boys Diastolic BP Percentile      Pulse Rate 77     Resp 18     Temp 97.7 F (36.5 C)     Temp Source Oral     SpO2 96 %     Weight      Height      Head Circumference      Peak Flow      Pain Score      Pain Loc      Pain Education      Exclude from Growth Chart     Most recent vital signs: Vitals:   01/10/24 1618 01/10/24 1619  BP:    Pulse:    Resp:    Temp:    SpO2: (!) 88% 94%    General: Awake, no distress.  CV:  Good peripheral perfusion.  Resp:  Mild tachypnea to the low 20s, no distress.  Mild wheezing throughout. Abd:  No distention.  MSK:  No deformity noted.  Pitting edema to bilateral lower extremities Neuro:  No focal deficits appreciated. Other:     ED Results / Procedures / Treatments   Labs (all labs ordered are listed, but only abnormal results are displayed) Labs Reviewed  BASIC METABOLIC PANEL WITH GFR - Abnormal; Notable for the following components:      Result Value   Sodium 126 (*)    Chloride 88 (*)    All other components within normal limits  PRO BRAIN NATRIURETIC PEPTIDE - Abnormal; Notable for the  following components:   Pro Brain Natriuretic Peptide 636.0 (*)    All other components within normal limits  CBC  TROPONIN T, HIGH SENSITIVITY  TROPONIN T, HIGH SENSITIVITY    EKG Sinus rhythm with a rate of 77 bpm.  Normal axis and intervals.  No clear signs of acute ischemia.  Tremulous baseline clouds fine detail  RADIOLOGY CXR interpreted by me without evidence of acute cardiopulmonary pathology.  Official radiology report(s): DG Chest 2 View Result Date: 01/10/2024 EXAM: 2 VIEW(S) XRAY OF THE CHEST 01/10/2024 12:45:48 PM COMPARISON: 2 days ago. CLINICAL HISTORY: shortness of breath FINDINGS: LUNGS AND PLEURA: No focal pulmonary opacity. No pleural effusion. No pneumothorax. HEART AND MEDIASTINUM: No acute abnormality of the cardiac and mediastinal silhouettes. BONES AND SOFT TISSUES: No acute osseous abnormality. IMPRESSION: 1. No acute cardiopulmonary process. Electronically signed by: Lynwood Seip MD 01/10/2024 01:05 PM EST RP Workstation: [id number]    PROCEDURES and INTERVENTIONS:  .  Critical Care  Performed by: Claudene Rover, MD Authorized by: Claudene Rover, MD   Critical care provider statement:    Critical care time (minutes):  30   Critical care time was exclusive of:  Separately billable procedures and treating other patients   Critical care was necessary to treat or prevent imminent or life-threatening deterioration of the following conditions:  Respiratory failure   Critical care was time spent personally by me on the following activities:  Development of treatment plan with patient or surrogate, discussions with consultants, evaluation of patient's response to treatment, examination of patient, ordering and review of laboratory studies, ordering and review of radiographic studies, ordering and performing treatments and interventions, pulse oximetry, re-evaluation of patient's condition and review of old charts .1-3 Lead EKG Interpretation  Performed by: Claudene Rover,  MD Authorized by: Claudene Rover, MD     Interpretation: normal     ECG rate:  70   ECG rate assessment: normal     Rhythm: sinus rhythm     Ectopy: none     Conduction: normal     Medications  furosemide  (LASIX ) injection 60 mg (has no administration in time range)  ipratropium-albuterol  (DUONEB) 0.5-2.5 (3) MG/3ML nebulizer solution 6 mL (has no administration in time range)  methylPREDNISolone  sodium succinate (SOLU-MEDROL ) 40 mg/mL injection 40 mg (has no administration in time range)     IMPRESSION / MDM / ASSESSMENT AND PLAN / ED COURSE  I reviewed the triage vital signs and the nursing notes.  Differential diagnosis includes, but is not limited to, ACS, PTX, PNA, muscle strain/spasm, PE, dissection, anxiety, pleural effusion  {Patient presents with symptoms of an acute illness or injury that is potentially life-threatening.  Patient presents with evidence of a multifactorial COPD/CHF exacerbation requiring medical admission.  Hypoxic on room air and requires nasal cannula.  Elevated BNP remains, low troponins, mild hyponatremia, normal CBC.  Noted occasions for antibiotics.  Initiate diuresis, breathing treatments and steroids and consult with medicine for admission.      FINAL CLINICAL IMPRESSION(S) / ED DIAGNOSES   Final diagnoses:  COPD exacerbation (HCC)  Shortness of breath  Acute on chronic diastolic congestive heart failure (HCC)     Rx / DC Orders   ED Discharge Orders     None        Note:  This document was prepared using Dragon voice recognition software and may include unintentional dictation errors.   Claudene Rover, MD 01/10/24 [phone]

## 2024-01-10 NOTE — Patient Outreach (Signed)
 RNCM - checked chart, patient not in ED, called patient.  States getting dressed, waiting to talk to BIL re evening coverage for cat. Discussed importance of getting to ED promptly, patient agreed to finish getting dressed and will call aide to request she work out evening coverage with BIL for cat and then call 911. Aware red flags for immediate 911 call.

## 2024-01-10 NOTE — ED Notes (Signed)
 Called CCMD to initiate cardiac monitoring.

## 2024-01-10 NOTE — ED Triage Notes (Addendum)
 Patient to ED for bilateral leg swelling and shortness of breath; states she was seen here two days ago for same. Was supposed to be admitted but patient states she had to make arrangements for her special needs cat.

## 2024-01-10 NOTE — H&P (Signed)
 History and Physical  Nicole Austin FMW:[id number] DOB: 04-12-1960 DOA: 01/10/2024  PCP: Leavy Mole, PA-C   Chief Complaint: Leg swelling, shortness of breath  HPI: Nicole Austin is a 63 y.o. female with medical history significant for COPD on as needed 2 L [place], bipolar 1 disorder being admitted to the hospital with several days of bilateral lower extremity edema, cough and dyspnea on exertion suspicious for combined acute exacerbation of COPD as well as new onset heart failure.  History is provided by the patient, says over the last few weeks she has had progressive significant swelling in her feet and legs, without pain.  She has no significant cardiac history, is not on diuretics.  In that same timeframe, she has developed a nonproductive wet sounding cough, and has been wheezing.  She denies any chest pain, fevers, chills, nausea, vomiting or any other concerns.  She was admitted to the hospital 2 days ago, but had to leave AMA to make arrangements for her pets and is now back with the same complaints.  Review of Systems: Please see HPI for pertinent positives and negatives. A complete 10 system review of systems are otherwise negative.  Past Medical History:  Diagnosis Date   ADHD (attention deficit hyperactivity disorder)    Apnea, sleep 06/30/2015   Arthritis    Asthma    Asthma with acute exacerbation 06/30/2015   Benign neoplasm of ascending colon    Bipolar 1 disorder (HCC)    Chronic pain    COPD, moderate (HCC) 11/18/2013   Depression    DOE (dyspnea on exertion)    Fall 07/04/2022   GERD (gastroesophageal reflux disease) 08/30/2015   Gout 08/16/2014   History of acute myocardial infarction 06/30/2015   History of panic attacks    HPV (human papilloma virus) infection 07/17/2013   Hyperlipidemia    Left knee pain 07/06/2022   Low HDL (under 40) 08/30/2015   MI (myocardial infarction) (HCC) 2018   MRSA infection 2023   groin abscess   Nose colonized with  MRSA 03/21/2022   a.) PCR (+) prior to LEFT THA   Osteoporosis    Parkinson disease (HCC)    Polyp of sigmoid colon    Pre-diabetes    Prediabetes 05/20/2016   PTSD (post-traumatic stress disorder)    Restless leg syndrome    Rhabdomyolysis    Sleep apnea 06/30/2015   Stage 3 severe COPD by GOLD classification (HCC) 11/18/2013   Stroke (HCC) 2018   right arm weakness   Traumatic rhabdomyolysis 07/04/2022   Tremors of nervous system    hands   Uncomplicated asthma 06/30/2015   Varicella 02/25/2017   Past Surgical History:  Procedure Laterality Date   BACK SURGERY     lumbar   CATARACT EXTRACTION W/PHACO Right 01/01/2019   Procedure: CATARACT EXTRACTION PHACO AND INTRAOCULAR LENS PLACEMENT (IOC) right vision blue;  Surgeon: Ferol Rogue, MD;  Location: [facility] ORS;  Service: Ophthalmology;  Laterality: Right;  US  00:39.4 CDE 5.49 Fluid Pack lot # [id number]   CATARACT EXTRACTION W/PHACO Left 01/29/2019   Procedure: CATARACT EXTRACTION PHACO AND INTRAOCULAR LENS PLACEMENT (IOC) LEFT Vision Blue;  Surgeon: Ferol Rogue, MD;  Location: [facility] ORS;  Service: Ophthalmology;  Laterality: Left;  US  00:51.1 CDE 4.38 Fluid Pack Lot # [id number]   COLONOSCOPY WITH PROPOFOL  N/A 08/07/2017   Procedure: COLONOSCOPY WITH PROPOFOL ;  Surgeon: Janalyn Keene NOVAK, MD;  Location: [facility] ENDOSCOPY;  Service: Endoscopy;  Laterality: N/A;   HAND SURGERY Left  fractured with pins   REPLACEMENT TOTAL KNEE Left 2016   SPINAL CORD STIMULATOR INSERTION  2014   SPINAL CORD STIMULATOR REMOVAL  2014   TOTAL HIP ARTHROPLASTY Left 10/09/2022   Procedure: TOTAL HIP ARTHROPLASTY ANTERIOR APPROACH;  Surgeon: Ernie Cough, MD;  Location: [facility] ORS;  Service: Orthopedics;  Laterality: Left;   TUBAL LIGATION     Social History:  reports that she has been smoking cigarettes. She started smoking about 37 years ago. She has a 18 pack-year smoking history. She has been exposed to tobacco smoke. She has never used smokeless  tobacco. She reports current drug use. Drug: Marijuana. She reports that she does not drink alcohol.  Allergies  Allergen Reactions   Chantix  [Varenicline ] Nausea Only   Glycopyrrolate  Rash    Oral irritation    Family History  Problem Relation Age of Onset   Hernia Mother    Heart disease Mother    OCD Mother    Diabetes Mother    Parkinson's disease Father    Bipolar disorder Sister    Schizophrenia Sister    ADD / ADHD Sister    Alcohol abuse Brother    Bipolar disorder Sister    Paranoid behavior Sister    ADD / ADHD Sister    ADD / ADHD Son    Dementia Maternal Grandmother    Emphysema Maternal Grandfather    ADD / ADHD Son    ADD / ADHD Son    Depression Son      Prior to Admission medications   Medication Sig Start Date End Date Taking? Authorizing Provider  albuterol  (VENTOLIN  HFA) 108 (90 Base) MCG/ACT inhaler INHALE TWO PUFFS INTO THE LUNGS EVERY SIX  HOURS AS NEEDED FOR WHEEZING OR SHORTNESS OF BREATH. 08/30/23   Tapia, [name], PA-C  ALPRAZolam  (XANAX ) 0.5 MG tablet Take 1 tablet (0.5 mg total) by mouth 2 (two) times daily. Patient taking differently: Take 0.5 mg by mouth 2 (two) times daily. Patient stated psych increased to 3-4x/day 07/10/22   Josette Ade, MD  cyanocobalamin  (VITAMIN B12) 1000 MCG tablet Take 1 tablet (1,000 mcg total) by mouth daily. 08/07/23   Tapia, [name], PA-C  diclofenac  (VOLTAREN ) 75 MG EC tablet Take 75 mg by mouth 2 (two) times daily. 09/05/23   [provider]  divalproex  (DEPAKOTE ) 250 MG DR tablet Take 1 tablet (250 mg total) by mouth 2 (two) times daily. 11/19/16   Mohammed Goldstein, MD  escitalopram  (LEXAPRO ) 20 MG tablet Take 20 mg by mouth daily.    [provider]  Iron , Ferrous Sulfate , 325 (65 Fe) MG TABS Take 325 mg by mouth every other day. 08/07/23   Tapia, [name], PA-C  lurasidone  (LATUDA ) 20 MG TABS tablet Take 20 mg by mouth daily. Patient not taking: Reported on 01/08/2024 11/18/23   [provider]   magnesium  oxide (MAG-OX) 400 (240 Mg) MG tablet TAKE ONE TABLET (400 MG TOTAL) BY MOUTH DAILY. 10/17/23   Tapia, [name], PA-C  miconazole  (MICOTIN) 2 % powder Apply topically 2 (two) times daily as needed for itching (Candidal intertrigo). 04/22/23   Tapia, [name], PA-C  mometasone -formoterol  (DULERA ) 100-5 MCG/ACT AERO Inhale 2 puffs into the lungs 2 (two) times daily. 02/11/23 01/08/24  [place], Eric J, DO  Multiple Vitamins-Minerals (MULTIVITAMIN WITH MINERALS) tablet Take 1 tablet by mouth daily.    [provider]  naproxen  (NAPROSYN ) 500 MG tablet Take 1 tablet (500 mg total) by mouth 2 (two) times daily with a meal. 01/01/24  Arlander Charleston, MD  nystatin  (MYCOSTATIN /NYSTOP ) powder Apply 1 Application topically 3 (three) times daily as needed (yeast/fungal rashes or intertrigo). 07/02/23   Tapia, [name], PA-C  oxyCODONE  (OXY IR/ROXICODONE ) 5 MG immediate release tablet Take 5 mg by mouth 4 (four) times daily as needed. Patient not taking: Reported on 01/08/2024 12/23/23   [provider]  primidone (MYSOLINE) 50 MG tablet Take 50 mg by mouth in the morning and at bedtime.    [provider]  promethazine  (PHENERGAN ) 25 MG tablet Take 25 mg by mouth every 6 (six) hours as needed for nausea or vomiting.    [provider]  psyllium (METAMUCIL) 58.6 % powder Take 1 packet by mouth as needed. Patient not taking: Reported on 01/08/2024    [provider]  rOPINIRole  (REQUIP  XL) 4 MG 24 hr tablet Take 4 mg by mouth at bedtime.    [provider]  sulfamethoxazole -trimethoprim  (BACTRIM  DS) 800-160 MG tablet Take 1 tablet by mouth 2 (two) times daily. 01/01/24   Arlander Charleston, MD  tiZANidine  (ZANAFLEX ) 2 MG tablet TAKE ONE (1) TO TWO (2) TABLETS (2-4 MG TOTAL) BY MOUTH EVERY EIGHT HOURS AS NEEDED FOR MUSCLE SPASMS. 10/25/23   Tapia, [name], PA-C  umeclidinium bromide  (INCRUSE ELLIPTA ) 62.5 MCG/ACT AEPB Inhale 1 puff into the lungs daily. 02/11/23   [place],  Eric J, DO    Physical Exam: BP (!) 146/58   Pulse 72   Temp 97.7 F (36.5 C) (Oral)   Resp 18   LMP  (LMP Unknown)   SpO2 94%  General:  Alert, oriented, calm, in no acute distress, pleasant and cooperative resting comfortably on 2 L nasal cannula oxygen  Cardiovascular: RRR, no murmurs or rubs, 3+ already pitting bilateral lower extremity edema up to thighs Respiratory: Bilateral wheezing, rhonchi, nonproductive cough, able to speak several words at a time, very minimal tachypnea, no retractions or acute respiratory distress Abdomen: soft, nontender, nondistended, normal bowel tones heard  Skin: dry, no rashes  Musculoskeletal: no joint effusions, normal range of motion  Psychiatric: appropriate affect, normal speech  Neurologic: extraocular muscles intact, clear speech, moving all extremities with intact sensorium         Labs on Admission:  Basic Metabolic Panel: Recent Labs  Lab 01/08/24 1034 01/10/24 1321  NA 123* 126*  K 4.9 3.6  CL 87* 88*  CO2 30 27  GLUCOSE 93 86  BUN 10 8  CREATININE 1.01* 0.88  CALCIUM  8.9 9.4   Liver Function Tests: Recent Labs  Lab 01/08/24 1034  AST 15  ALT 6  ALKPHOS 90  BILITOT 0.2  PROT 6.2*  ALBUMIN 3.6   No results for input(s): LIPASE, AMYLASE in the last 168 hours. No results for input(s): AMMONIA in the last 168 hours. CBC: Recent Labs  Lab 01/08/24 1034 01/10/24 1321  WBC 6.7 6.7  NEUTROABS 3.8  --   HGB 12.5 13.2  HCT 39.3 40.2  MCV 89.1 88.5  PLT 237 242   Cardiac Enzymes: No results for input(s): CKTOTAL, CKMB, CKMBINDEX, TROPONINI in the last 168 hours. BNP (last 3 results) Recent Labs    12/21/23 1438  BNP 83.5    ProBNP (last 3 results) Recent Labs    01/08/24 1034 01/10/24 1321  PROBNP 347.0* 636.0*    CBG: No results for input(s): GLUCAP in the last 168 hours.  Radiological Exams on Admission: DG Chest 2 View Result Date: 01/10/2024 EXAM: 2 VIEW(S) XRAY OF THE CHEST  01/10/2024 12:45:48 PM COMPARISON: 2  days ago. CLINICAL HISTORY: shortness of breath FINDINGS: LUNGS AND PLEURA: No focal pulmonary opacity. No pleural effusion. No pneumothorax. HEART AND MEDIASTINUM: No acute abnormality of the cardiac and mediastinal silhouettes. BONES AND SOFT TISSUES: No acute osseous abnormality. IMPRESSION: 1. No acute cardiopulmonary process. Electronically signed by: Lynwood Seip MD 01/10/2024 01:05 PM EST RP Workstation: [id number]   Assessment/Plan Nicole Austin is a 63 y.o. female with medical history significant for COPD on as needed 2 L [place], bipolar 1 disorder being admitted to the hospital with several days of bilateral lower extremity edema, cough and dyspnea on exertion suspicious for combined acute exacerbation of COPD as well as new onset heart failure.  Suspected new onset CHF, likely diastolic-due to elevated BNP, pulmonary edema, crackles on pulmonary exam -Observation admission -Monitor on telemetry -IV Lasix  diuresis -Check 2D echo, last echo 2017 was essentially normal  Acute exacerbation of COPD-with increased cough, wheezing.  No fevers, chest pain. -Scheduled DuoNebs -As needed albuterol  -IV steroids daily -Incentive spirometer, flutter valve  Hyponatremia-suspect this is hypervolemic hyponatremia in the setting of heart failure.  It is asymptomatic. -Diurese as above -Fluid restriction -Check sodium with morning labs  Chronic hypoxic respiratory failure-due to COPD, continue baseline 2 L [place]  Lower extremity edema-note she had negative bilateral DVT study on 10/25  DVT prophylaxis: Lovenox      Code Status: Full Code  Consults called: None  Admission status: Observation  Time spent: 49 minutes  Neno Hohensee CHRISTELLA Gail MD [facility] Pager [phone]  If 7PM-7AM, please contact night-coverage [email or web address] Password [email or web address]  01/10/2024, 4:40 PM

## 2024-01-10 NOTE — Patient Outreach (Signed)
 RNCM - scheduled visit, chart review noted patient signed out of ED AMA - dx hypervolemia/anasarca, COPD. Patient tearful, crying at start of call, reports feeling terrible and scared, also reporting significant hip and groin pain limiting mobility.  Reports aides agreed to take care of cat, patient declined RNCM to call them - she will call them and then call 911 to return to ED. Aware RNCM will monitor chart and follow up. Concern for wellbeing and positive support provided for finding solution to care for cat and now to focus on self care. Agreed will call 911 asap.

## 2024-01-11 ENCOUNTER — Observation Stay: Admit: 2024-01-11 | Discharge: 2024-01-11 | Disposition: A | Attending: Internal Medicine

## 2024-01-11 DIAGNOSIS — I5031 Acute diastolic (congestive) heart failure: Secondary | ICD-10-CM | POA: Diagnosis not present

## 2024-01-11 LAB — CBC
HCT: 39.1 % (ref 36.0–46.0)
Hemoglobin: 13.2 g/dL (ref 12.0–15.0)
MCH: 28.9 pg (ref 26.0–34.0)
MCHC: 33.8 g/dL (ref 30.0–36.0)
MCV: 85.6 fL (ref 80.0–100.0)
Platelets: 260 K/uL (ref 150–400)
RBC: 4.57 MIL/uL (ref 3.87–5.11)
RDW: 14 % (ref 11.5–15.5)
WBC: 6.9 K/uL (ref 4.0–10.5)
nRBC: 0 % (ref 0.0–0.2)

## 2024-01-11 LAB — MAGNESIUM: Magnesium: 1.6 mg/dL — ABNORMAL LOW (ref 1.7–2.4)

## 2024-01-11 LAB — BASIC METABOLIC PANEL WITH GFR
Anion gap: 10 (ref 5–15)
BUN: 10 mg/dL (ref 8–23)
CO2: 29 mmol/L (ref 22–32)
Calcium: 9.3 mg/dL (ref 8.9–10.3)
Chloride: 88 mmol/L — ABNORMAL LOW (ref 98–111)
Creatinine, Ser: 0.86 mg/dL (ref 0.44–1.00)
GFR, Estimated: >60 mL/min (ref >60)
Glucose, Bld: 118 mg/dL — ABNORMAL HIGH (ref 70–99)
Potassium: 4.7 mmol/L (ref 3.5–5.1)
Sodium: 127 mmol/L — ABNORMAL LOW (ref 135–145)

## 2024-01-11 LAB — PHOSPHORUS: Phosphorus: 3.7 mg/dL (ref 2.5–4.6)

## 2024-01-11 LAB — ECHOCARDIOGRAM COMPLETE
AR max vel: 2.3 cm2
AV Peak grad: 5.6 mmHg
Ao pk vel: 1.18 m/s
Area-P 1/2: 3.54 cm2
S' Lateral: 3.3 cm

## 2024-01-11 MED ORDER — FLUTICASONE FUROATE-VILANTEROL 100-25 MCG/ACT IN AEPB
1.0000 | INHALATION_SPRAY | Freq: Every day | RESPIRATORY_TRACT | Status: DC
  Administered 2024-01-11 – 2024-01-13 (×3): 1 via RESPIRATORY_TRACT
  Filled 2024-01-11: qty 28

## 2024-01-11 MED ORDER — UMECLIDINIUM BROMIDE 62.5 MCG/ACT IN AEPB
1.0000 | INHALATION_SPRAY | Freq: Every day | RESPIRATORY_TRACT | Status: DC
  Administered 2024-01-11 – 2024-01-13 (×3): 1 via RESPIRATORY_TRACT
  Filled 2024-01-11: qty 7

## 2024-01-11 MED ORDER — GUAIFENESIN ER 600 MG PO TB12
600.0000 mg | ORAL_TABLET | Freq: Two times a day (BID) | ORAL | Status: DC
  Administered 2024-01-11 – 2024-01-13 (×5): 600 mg via ORAL
  Filled 2024-01-11 (×5): qty 1

## 2024-01-11 MED ORDER — SODIUM CHLORIDE 1 G PO TABS
2.0000 g | ORAL_TABLET | Freq: Two times a day (BID) | ORAL | Status: DC
  Administered 2024-01-11: 2 g via ORAL
  Filled 2024-01-11: qty 2

## 2024-01-11 MED ORDER — MAGNESIUM SULFATE 2 GM/50ML IV SOLN
2.0000 g | Freq: Once | INTRAVENOUS | Status: AC
  Administered 2024-01-11: 2 g via INTRAVENOUS
  Filled 2024-01-11: qty 50

## 2024-01-11 MED ORDER — FUROSEMIDE 10 MG/ML IJ SOLN
40.0000 mg | Freq: Two times a day (BID) | INTRAMUSCULAR | Status: DC
  Administered 2024-01-11 – 2024-01-13 (×6): 40 mg via INTRAVENOUS
  Filled 2024-01-11 (×6): qty 4

## 2024-01-11 MED ORDER — SODIUM CHLORIDE 1 G PO TABS
2.0000 g | ORAL_TABLET | Freq: Two times a day (BID) | ORAL | Status: DC
  Administered 2024-01-11 – 2024-01-13 (×4): 2 g via ORAL
  Filled 2024-01-11 (×4): qty 2

## 2024-01-11 MED ORDER — NITROGLYCERIN 0.4 MG SL SUBL
0.4000 mg | SUBLINGUAL_TABLET | SUBLINGUAL | Status: DC | PRN
Start: 2024-01-11 — End: 2024-01-13

## 2024-01-11 MED ORDER — HYDROCOD POLI-CHLORPHE POLI ER 10-8 MG/5ML PO SUER
5.0000 mL | Freq: Two times a day (BID) | ORAL | Status: DC | PRN
  Administered 2024-01-11 – 2024-01-12 (×3): 5 mL via ORAL
  Filled 2024-01-11 (×3): qty 5

## 2024-01-11 NOTE — Progress Notes (Signed)
 [facility] Progress Note  Patient: Nicole Austin    FMW:[id number]  DOA: 01/10/2024     Date of Service: the patient was seen and examined on 01/11/2024  Chief Complaint  Patient presents with   Shortness of Breath   Brief hospital course: Nicole Austin is a 63 y.o. female with medical history significant for COPD on as needed 2 L Yancey, bipolar 1 disorder being admitted to the hospital with several days of bilateral lower extremity edema, cough and dyspnea on exertion suspicious for combined acute exacerbation of COPD as well as new onset heart failure.  History is provided by the patient, says over the last few weeks she has had progressive significant swelling in her feet and legs, without pain.  She has no significant cardiac history, is not on diuretics.  In that same timeframe, she has developed a nonproductive wet sounding cough, and has been wheezing.  She denies any chest pain, fevers, chills, nausea, vomiting or any other concerns.  She was admitted to the hospital 2 days ago, but had to leave AMA to make arrangements for her pets and is now back with the same complaints.    Assessment and Plan:  # Acute respiratory failure due to COPD and CHF exacerbation Patient has oxygen  at home which she uses as needed Continue supplemental O2 admission and gradually wean off Treat COPD and CHF as below   # Acute COPD exacerbation Continue Breo Ellipta inhaler and Incruse Ellipta  inhaler DuoNeb every 6 hourly Solu-Medrol  40 mg IV daily Mucinex  600 twice daily Tussionex as needed for cough   # Acute on chronic diastolic CHF exacerbation Mild lower extremity edema BNP 636 elevated Continue Lasix  40 mg IV twice daily Fluid striction 1.5 L/day Monitor renal function and urine output TTE LVEF 60- 65%, no WMA, grade 1 DD   # Hypotonic hyponatremia due to volume overload/CHF Serum osmolality 264, low Na 126>127 Started salt tablets 2 g twice daily x 3 days Continue  fluid restriction 1.5 L/day Monitor sodium level daily  # Hypomagnesemia, mag repleted. Monitor electrolytes daily and replete as needed.  # Left lower extremity cellulitis, patient was seen in the ED on 11/5 and patient was discharged on Bactrim  and Keflex  and naproxen  Mild tenderness, no erythema or any sign of infection It seems cellulitis is resolving Continue to monitor off antibiotics for now  # Nicotine  dependence, patient smokes cigarettes daily.  Smoking cessation counseling done.   There is no height or weight on file to calculate BMI.  Interventions:  Diet: Heart healthy diet, fluid striction 1.5 L/day DVT Prophylaxis: Subcutaneous Lovenox    Advance goals of care discussion: Full code  Family Communication: family was not present at bedside, at the time of interview.  The pt provided permission to discuss medical plan with the family. Opportunity was given to ask question and all questions were answered satisfactorily.   Disposition:  Pt is from Home, admitted with resp failure, copd and CHF, still has SOB, which precludes a safe discharge. Discharge to Home, when stable, may need few days to improve.  Subjective: No significant events overnight, patient is still having shortness of breath, wheezes and chest tightness. Lower extremity edema is getting better, still has some tenderness in the left lower extremity, no any other complaints.  Physical Exam: General: NAD, lying comfortably Appear in no distress, affect appropriate Eyes: PERRLA ENT: Oral Mucosa Clear, moist  Neck: no JVD,  Cardiovascular: S1 and S2 Present, no Murmur,  Respiratory: good  respiratory effort, Bilateral Air entry equal and Decreased, mild bilateral crackles and mild wheezes Abdomen: Bowel Sound present, Soft and no tenderness,  Skin: no rashes Extremities: Mild edema left >Rt, mild LLE tenderness, resolving cellulitis. Neurologic: without any new focal findings Gait not checked due to  patient safety concerns  Vitals:   01/11/24 1230 01/11/24 1300 01/11/24 1321 01/11/24 1330  BP: (!) 159/135 (!) 141/85  (!) 122/55  Pulse: 76 66  82  Resp: 18 16  14   Temp:   98.2 F (36.8 C)   TempSrc:   Oral   SpO2: 97% 94%  93%    Intake/Output Summary (Last 24 hours) at 01/11/2024 1343 Last data filed at 01/11/2024 1205 Gross per 24 hour  Intake --  Output 2200 ml  Net -2200 ml   There were no vitals filed for this visit.  Data Reviewed: I have personally reviewed and interpreted daily labs, tele strips, imagings as discussed above. I reviewed all nursing notes, pharmacy notes, vitals, pertinent old records I have discussed plan of care as described above with RN and patient/family.  CBC: Recent Labs  Lab 01/08/24 1034 01/10/24 1321 01/11/24 0344  WBC 6.7 6.7 6.9  NEUTROABS 3.8  --   --   HGB 12.5 13.2 13.2  HCT 39.3 40.2 39.1  MCV 89.1 88.5 85.6  PLT 237 242 260   Basic Metabolic Panel: Recent Labs  Lab 01/08/24 1034 01/10/24 1321 01/11/24 0344  NA 123* 126* 127*  K 4.9 3.6 4.7  CL 87* 88* 88*  CO2 30 27 29   GLUCOSE 93 86 118*  BUN 10 8 10   CREATININE 1.01* 0.88 0.86  CALCIUM  8.9 9.4 9.3  MG  --   --  1.6*  PHOS  --   --  3.7    Studies: ECHOCARDIOGRAM COMPLETE Result Date: 01/11/2024    ECHOCARDIOGRAM REPORT   Patient Name:   Nicole Austin Brisbane Date of Exam: 01/11/2024 Medical Rec #:  [id number]            Height:       61.0 in Accession #:    [id number]           Weight:       235.0 lb Date of Birth:  1961-01-12             BSA:          2.023 m Patient Age:    63 years             BP:           146/98 mmHg Patient Gender: F                    HR:           75 bpm. Exam Location:  [facility] Procedure: 2D Echo, 3D Echo, Cardiac Doppler, Color Doppler and Strain Analysis            (Both Spectral and Color Flow Doppler were utilized during            procedure). Indications:     CHF-Acute Diastolic  History:         Patient has prior history of  Echocardiogram examinations, most                  recent 03/26/2015. Previous Myocardial Infarction, COPD and                  Stroke; Risk Factors:Sleep Apnea, Dyslipidemia  and Current                  Smoker.  Sonographer:     Logan Shove RDCS Referring Phys:  [id number] MIR CHRISTELLA [facility] Diagnosing Phys: Cara JONETTA Lovelace MD  Sonographer Comments: Suboptimal parasternal window. IMPRESSIONS  1. Left ventricular ejection fraction, by estimation, is 60 to 65%. The left ventricle has normal function. The left ventricle has no regional wall motion abnormalities. Left ventricular diastolic parameters are consistent with Grade I diastolic dysfunction (impaired relaxation). The average left ventricular global longitudinal strain is 19.7 %. The global longitudinal strain is normal.  2. Right ventricular systolic function is normal. The right ventricular size is normal.  3. Left atrial size was mildly dilated.  4. The mitral valve is normal in structure. Trivial mitral valve regurgitation.  5. The aortic valve is normal in structure. Aortic valve regurgitation is not visualized. FINDINGS  Left Ventricle: Left ventricular ejection fraction, by estimation, is 60 to 65%. The left ventricle has normal function. The left ventricle has no regional wall motion abnormalities. The average left ventricular global longitudinal strain is 19.7 %. Strain was performed and the global longitudinal strain is normal. The left ventricular internal cavity size was normal in size. There is no left ventricular hypertrophy. Left ventricular diastolic parameters are consistent with Grade I diastolic dysfunction (impaired relaxation). Right Ventricle: The right ventricular size is normal. No increase in right ventricular wall thickness. Right ventricular systolic function is normal. Left Atrium: Left atrial size was mildly dilated. Right Atrium: Right atrial size was normal in size. Pericardium: There is no evidence of pericardial effusion. Mitral  Valve: The mitral valve is normal in structure. Trivial mitral valve regurgitation. Tricuspid Valve: The tricuspid valve is normal in structure. Tricuspid valve regurgitation is trivial. Aortic Valve: The aortic valve is normal in structure. Aortic valve regurgitation is not visualized. Aortic valve peak gradient measures 5.6 mmHg. Pulmonic Valve: The pulmonic valve was normal in structure. Pulmonic valve regurgitation is not visualized. Aorta: The ascending aorta was not well visualized. IAS/Shunts: No atrial level shunt detected by color flow Doppler. Additional Comments: 3D was performed not requiring image post processing on an independent workstation and was normal.  LEFT VENTRICLE PLAX 2D LVIDd:         4.80 cm   Diastology LVIDs:         3.30 cm   LV e' medial:    6.53 cm/s LV PW:         1.00 cm   LV E/e' medial:  13.8 LV IVS:        1.10 cm   LV e' lateral:   7.72 cm/s LVOT diam:     1.90 cm   LV E/e' lateral: 11.7 LVOT Area:     2.84 cm                          2D Longitudinal Strain                          2D Strain GLS (A4C):   17.6 %                          2D Strain GLS (A3C):   21.3 %                          2D  Strain GLS (A2C):   20.3 %                          2D Strain GLS Avg:     19.7 %                           3D Volume EF:                          3D EF:        58 %                          LV EDV:       118 ml                          LV ESV:       50 ml                          LV SV:        68 ml RIGHT VENTRICLE             IVC RV Basal diam:  3.60 cm     IVC diam: 1.20 cm RV S prime:     11.20 cm/s TAPSE (M-mode): 2.1 cm LEFT ATRIUM             Index        RIGHT ATRIUM           Index LA diam:        4.00 cm 1.98 cm/m   RA Area:     20.20 cm LA Vol (A2C):   52.0 ml 25.71 ml/m  RA Volume:   61.00 ml  30.16 ml/m LA Vol (A4C):   47.8 ml 23.63 ml/m LA Biplane Vol: 51.0 ml 25.21 ml/m  AORTIC VALVE AV Area (Vmax): 2.30 cm AV Vmax:        118.00 cm/s AV Peak Grad:   5.6 mmHg LVOT  Vmax:      95.90 cm/s  AORTA Ao Root diam: 2.30 cm Ao Asc diam:  2.80 cm MITRAL VALVE MV Area (PHT): 3.54 cm    SHUNTS MV Decel Time: 214 msec    Systemic Diam: 1.90 cm MV E velocity: 90.30 cm/s MV A velocity: 97.00 cm/s MV E/A ratio:  0.93 Dwayne D Callwood MD Electronically signed by Cara JONETTA Lovelace MD Signature Date/Time: 01/11/2024/11:25:18 AM    Final     Scheduled Meds:  ALPRAZolam   0.5 mg Oral TID   cyanocobalamin   1,000 mcg Oral Daily   divalproex   250 mg Oral BID   enoxaparin  (LOVENOX ) injection  50 mg Subcutaneous Q24H   ferrous sulfate   325 mg Oral QODAY   furosemide   40 mg Intravenous BID   ipratropium-albuterol   3 mL Nebulization QID   methylPREDNISolone  (SOLU-MEDROL ) injection  40 mg Intravenous Daily   primidone  50 mg Oral QHS   psyllium  1 packet Oral Daily   rOPINIRole   4 mg Oral QHS   sodium chloride   2 g Oral BID WC   Continuous Infusions: PRN Meds: acetaminophen  **OR** acetaminophen , morphine  injection, traZODone   Time spent: 55 minutes  Author: ELVAN SOR. MD [facility] 01/11/2024 1:43 PM  To reach On-call, see care teams to locate the attending  and reach out to them via [email or web address]. If 7PM-7AM, please contact night-coverage If you still have difficulty reaching the attending provider, please page the [facility] (Director on Call) for [facility] on amion for assistance.

## 2024-01-11 NOTE — Progress Notes (Signed)
 Echocardiogram 2D Echocardiogram has been performed with 2D spectral doppler, strain and 3D.  Avamae Dehaan N Hardy Harcum,RDCS 01/11/2024, 10:18 AM

## 2024-01-11 NOTE — ED Notes (Signed)
 Brought pt a cup of decaf coffee at this time.

## 2024-01-12 DIAGNOSIS — Z716 Tobacco abuse counseling: Secondary | ICD-10-CM | POA: Diagnosis not present

## 2024-01-12 DIAGNOSIS — Z96642 Presence of left artificial hip joint: Secondary | ICD-10-CM | POA: Diagnosis present

## 2024-01-12 DIAGNOSIS — J9621 Acute and chronic respiratory failure with hypoxia: Secondary | ICD-10-CM | POA: Diagnosis present

## 2024-01-12 DIAGNOSIS — R0602 Shortness of breath: Secondary | ICD-10-CM

## 2024-01-12 DIAGNOSIS — I252 Old myocardial infarction: Secondary | ICD-10-CM | POA: Diagnosis not present

## 2024-01-12 DIAGNOSIS — J441 Chronic obstructive pulmonary disease with (acute) exacerbation: Secondary | ICD-10-CM | POA: Diagnosis present

## 2024-01-12 DIAGNOSIS — G2581 Restless legs syndrome: Secondary | ICD-10-CM | POA: Diagnosis present

## 2024-01-12 DIAGNOSIS — Z1152 Encounter for screening for COVID-19: Secondary | ICD-10-CM | POA: Diagnosis not present

## 2024-01-12 DIAGNOSIS — Z96652 Presence of left artificial knee joint: Secondary | ICD-10-CM | POA: Diagnosis present

## 2024-01-12 DIAGNOSIS — Z818 Family history of other mental and behavioral disorders: Secondary | ICD-10-CM | POA: Diagnosis not present

## 2024-01-12 DIAGNOSIS — E222 Syndrome of inappropriate secretion of antidiuretic hormone: Secondary | ICD-10-CM | POA: Diagnosis present

## 2024-01-12 DIAGNOSIS — Z8673 Personal history of transient ischemic attack (TIA), and cerebral infarction without residual deficits: Secondary | ICD-10-CM | POA: Diagnosis not present

## 2024-01-12 DIAGNOSIS — I5033 Acute on chronic diastolic (congestive) heart failure: Secondary | ICD-10-CM | POA: Diagnosis present

## 2024-01-12 DIAGNOSIS — Z833 Family history of diabetes mellitus: Secondary | ICD-10-CM | POA: Diagnosis not present

## 2024-01-12 DIAGNOSIS — M810 Age-related osteoporosis without current pathological fracture: Secondary | ICD-10-CM | POA: Diagnosis present

## 2024-01-12 DIAGNOSIS — I5031 Acute diastolic (congestive) heart failure: Secondary | ICD-10-CM | POA: Diagnosis not present

## 2024-01-12 DIAGNOSIS — F319 Bipolar disorder, unspecified: Secondary | ICD-10-CM | POA: Diagnosis present

## 2024-01-12 DIAGNOSIS — Z888 Allergy status to other drugs, medicaments and biological substances status: Secondary | ICD-10-CM | POA: Diagnosis not present

## 2024-01-12 DIAGNOSIS — Z7951 Long term (current) use of inhaled steroids: Secondary | ICD-10-CM | POA: Diagnosis not present

## 2024-01-12 DIAGNOSIS — G20A1 Parkinson's disease without dyskinesia, without mention of fluctuations: Secondary | ICD-10-CM | POA: Diagnosis present

## 2024-01-12 DIAGNOSIS — Z8249 Family history of ischemic heart disease and other diseases of the circulatory system: Secondary | ICD-10-CM | POA: Diagnosis not present

## 2024-01-12 DIAGNOSIS — F17210 Nicotine dependence, cigarettes, uncomplicated: Secondary | ICD-10-CM | POA: Diagnosis present

## 2024-01-12 DIAGNOSIS — Z820 Family history of epilepsy and other diseases of the nervous system: Secondary | ICD-10-CM | POA: Diagnosis not present

## 2024-01-12 DIAGNOSIS — E785 Hyperlipidemia, unspecified: Secondary | ICD-10-CM | POA: Diagnosis present

## 2024-01-12 LAB — CBC
HCT: 37.2 % (ref 36.0–46.0)
Hemoglobin: 12.6 g/dL (ref 12.0–15.0)
MCH: 29.2 pg (ref 26.0–34.0)
MCHC: 33.9 g/dL (ref 30.0–36.0)
MCV: 86.3 fL (ref 80.0–100.0)
Platelets: 263 K/uL (ref 150–400)
RBC: 4.31 MIL/uL (ref 3.87–5.11)
RDW: 14 % (ref 11.5–15.5)
WBC: 9.4 K/uL (ref 4.0–10.5)
nRBC: 0 % (ref 0.0–0.2)

## 2024-01-12 LAB — RESPIRATORY PANEL BY PCR

## 2024-01-12 LAB — BASIC METABOLIC PANEL WITH GFR
Anion gap: 11 (ref 5–15)
BUN: 13 mg/dL (ref 8–23)
CO2: 30 mmol/L (ref 22–32)
Calcium: 9.1 mg/dL (ref 8.9–10.3)
Chloride: 86 mmol/L — ABNORMAL LOW (ref 98–111)
Creatinine, Ser: 0.74 mg/dL (ref 0.44–1.00)
GFR, Estimated: >60 mL/min (ref >60)
Glucose, Bld: 98 mg/dL (ref 70–99)
Potassium: 3.6 mmol/L (ref 3.5–5.1)
Sodium: 127 mmol/L — ABNORMAL LOW (ref 135–145)

## 2024-01-12 LAB — RESP PANEL BY RT-PCR (RSV, FLU A&B, COVID)  RVPGX2
Influenza A by PCR: NEGATIVE
Influenza B by PCR: NEGATIVE
Resp Syncytial Virus by PCR: NEGATIVE
SARS Coronavirus 2 by RT PCR: NEGATIVE

## 2024-01-12 LAB — MAGNESIUM: Magnesium: 1.6 mg/dL — ABNORMAL LOW (ref 1.7–2.4)

## 2024-01-12 LAB — PHOSPHORUS: Phosphorus: 3.4 mg/dL (ref 2.5–4.6)

## 2024-01-12 MED ORDER — IPRATROPIUM-ALBUTEROL 0.5-2.5 (3) MG/3ML IN SOLN
3.0000 mL | Freq: Three times a day (TID) | RESPIRATORY_TRACT | Status: DC
  Administered 2024-01-12 – 2024-01-13 (×3): 3 mL via RESPIRATORY_TRACT
  Filled 2024-01-12 (×3): qty 3

## 2024-01-12 MED ORDER — MAGNESIUM SULFATE 4 GM/100ML IV SOLN
4.0000 g | Freq: Once | INTRAVENOUS | Status: AC
  Administered 2024-01-12: 4 g via INTRAVENOUS
  Filled 2024-01-12: qty 100

## 2024-01-12 NOTE — ED Notes (Signed)
 Pur Wick changed.

## 2024-01-12 NOTE — TOC Initial Note (Signed)
 Transition of Care [facility]) - Initial/Assessment Note    Patient Details  Name: Nicole Austin MRN: [id number] Date of Birth: April 23, 1960  [facility]) CM/SW Contact:    Kelin Nixon L Aubreana Cornacchia, LCSW Phone Number: 01/12/2024, 10:06 AM  Clinical Narrative:                    Brief assessment completed. Patient advised of SNF recommendations. Patient prefers to go home. Patient advised that she lives alone with her cat. She stated that she has family support re: sister and brother and law, who assist her. She also advised that she has aides that come into her home daily to provide home care. She stated that she has meals on wheels.   To get to medical appointments she advised that [facility] has transportation support services that she utilizes.   Patient has a BSC, RW, Rollator and a Cane in the home. She stated that she is able to ambulate and walk to the bathroom using DME.   Regarding Home Health services, patient was agreeable to receiving services from [facility] again.      Patient Goals and CMS Choice            Expected Discharge Plan and Services                                              Prior Living Arrangements/Services                       Activities of Daily Living      Permission Sought/Granted                  Emotional Assessment              Admission diagnosis:  Acute diastolic (congestive) heart failure (HCC) [I50.31] Patient Active Problem List   Diagnosis Date Noted   Acute diastolic (congestive) heart failure (HCC) 01/10/2024   Acute on chronic diastolic CHF (congestive heart failure) (HCC) 01/08/2024   CHF (congestive heart failure) (HCC) 01/08/2024   Moderately severe major depression (HCC) 08/07/2023   Bipolar affective disorder, current episode mixed (HCC) 03/15/2023   COPD exacerbation (HCC) 02/08/2023   COVID-19 virus infection 02/08/2023   S/P total left hip arthroplasty 10/09/2022    SIADH (syndrome of inappropriate ADH production) 07/09/2022   Iron  deficiency anemia 07/07/2022   History of methicillin resistant staphylococcus aureus (MRSA) 05/03/2022   Avascular necrosis of bones of both hips (HCC) 05/03/2022   Chronic prescription benzodiazepine use 05/03/2022   Polypharmacy 04/09/2022   Sinus bradycardia 04/09/2022   Depression with anxiety 04/09/2022   MRSA (methicillin resistant staph aureus) culture positive 05/04/2021   Prolonged QT interval 01/01/2021   Bipolar disorder with depression (HCC) 02/25/2017   ADD (attention deficit disorder) 02/25/2017   Marijuana use 02/13/2017   Prediabetes 05/20/2016   Neuropathy, peripheral 05/07/2016   Vitamin B12 deficiency 10/24/2015   Fibromyalgia 08/30/2015   OP (osteoporosis) 06/30/2015   Sleep apnea 06/30/2015   HLD (hyperlipidemia) 05/13/2015   Class 3 severe obesity with serious comorbidity and body mass index (BMI) of 45.0 to 49.9 in adult (HCC) 08/16/2014   Obesity, Class III, BMI 40-49.9 (morbid obesity) (HCC) 08/16/2014   Low back pain with sciatica 08/04/2014   Tobacco abuse 08/04/2014   Anxiety, generalized 11/24/2013   COPD (chronic  obstructive pulmonary disease) (HCC) 11/18/2013   Osteoarthritis of knee, unspecified 07/15/2013   PCP:  Leavy Mole, PA-C Pharmacy:   [facility] [place], [place] - [address] [place] [address] Phone: [phone] Fax: [phone]  [facility] - [place], [place] - [address] [place] [address] Phone: [phone] Fax: [phone]  [facility] [address] [place] - [address] [place] [address] Phone: [phone] Fax: [phone]  [id number] - [place], [place] - [address] AT [facility] OF [address] [facility] [address] [place] [address] Phone: [phone] Fax: [phone]  [id number] - [place], [place] - [address] AT [facility] OF [name] [place] [address] Phone: [phone] Fax: [phone]     Social Drivers of Health (SDOH) Social History: SDOH Screenings   Food Insecurity: No Food Insecurity (11/15/2023)  Housing: Low Risk  (11/15/2023)  Transportation Needs: No Transportation Needs (11/15/2023)  Utilities: Not At Risk (11/15/2023)  Alcohol Screen: Low Risk  (03/21/2023)  Depression (PHQ2-9): Low Risk  (11/15/2023)  Recent Concern: Depression (PHQ2-9) - Medium Risk (10/23/2023)  Financial Resource Strain: Low Risk  (08/08/2023)   Received from [facility]  Physical Activity: Insufficiently Active (03/21/2023)  Social Connections: Moderately Isolated (03/21/2023)  Stress: Stress Concern Present (03/21/2023)  Tobacco Use: High Risk (01/10/2024)  Health Literacy: Adequate Health Literacy (03/21/2023)   SDOH Interventions:     Readmission Risk Interventions    02/10/2023    9:52 AM  Readmission Risk Prevention Plan  Transportation Screening Complete  Medication Review (RN Care Manager) Complete  PCP or Specialist appointment within 3-5 days of discharge Complete  HRI or Home Care Consult Complete  SW Recovery Care/Counseling Consult Complete  Palliative Care Screening Not Applicable  Skilled Nursing Facility Complete

## 2024-01-12 NOTE — ED Notes (Signed)
 Called Lab to ask about Resp. Swabs resulted yet. Lab stated that NT that sent them at 1015 did not put a label on them. This RN re-swabbed and sent to lab.

## 2024-01-12 NOTE — Discharge Instructions (Addendum)
 Home Health services re: Physical and Occupational therapy to be received from [facility]. Start of care will be 24 to 48 hours after discharge.

## 2024-01-12 NOTE — Evaluation (Signed)
 Physical Therapy Evaluation Patient Details Name: Nicole Austin MRN: [id number] DOB: 30-Jan-1961 Today's Date: 01/12/2024  History of Present Illness  Nicole Austin is a 63 y.o. female with medical history significant for COPD on as needed 2 L [place], bipolar 1 disorder being admitted to the hospital with several days of bilateral lower extremity edema, cough and dyspnea on exertion suspicious for combined acute exacerbation of COPD as well as new onset heart failure.  History is provided by the patient, says over the last few weeks she has had progressive significant swelling in her feet and legs, without pain.  She has no significant cardiac history, is not on diuretics.  In that same timeframe, she has developed a nonproductive wet sounding cough, and has been wheezing.  She denies any chest pain, fevers, chills, nausea, vomiting or any other concerns.  She was admitted to the hospital 2 days ago, but had to leave AMA to make arrangements for her pets and is now back with the same complaints.  Clinical Impression  Patient noted to be in supine position at PT arrival in room, for an initial PT evaluation due to a decline in functional status, with baseline mobility reported as modI, and currently requiring modA for ambulation in room. The patient is A&O x 4, presenting with good willingness to work with PT and goals of going home with pet cat, with discharge expectations that include SNF although she was VERY upset crying in room after being told about recommendation. The patient resides in a apartment and lives alone with family/friend support. There is 1 set x 2 to enter inside the residence.  Vitals are unstable with an SpO? of 90-93% on 3 L/min. Pt. Not able to tolerate sitting at rest on RM. Gait was assessed with single point cane with distance of 25' feet limited by fatigue and de-stating O2 levels. Therapist, nutritional observations noted: very unsteady and limited tolerance for gait due to  fatigue. The overall clinical impression is that the patient presents with mild to moderate mobility limitations secondary to acute medical complication and sedentary lifestyle. Recommended skilled PT will address safety, mobility, and discharge planning. PT recommendation to d/c patient to SNF upon medical clearance with good chance of upgrading to HHPT.         If plan is discharge home, recommend the following: A little help with walking and/or transfers;A little help with bathing/dressing/bathroom;Help with stairs or ramp for entrance   Can travel by private vehicle   Yes    Equipment Recommendations None recommended by PT  Recommendations for Other Services       Functional Status Assessment Patient has had a recent decline in their functional status and/or demonstrates limited ability to make significant improvements in function in a reasonable and predictable amount of time     Precautions / Restrictions Precautions Precautions: Fall Restrictions Weight Bearing Restrictions Per Provider Order: No      Mobility  Bed Mobility Overal bed mobility: Needs Assistance Bed Mobility: Supine to Sit, Sit to Supine     Supine to sit: Contact guard, Supervision Sit to supine: Contact guard assist, Supervision        Transfers Overall transfer level: Needs assistance Equipment used: Straight cane Transfers: Sit to/from Stand Sit to Stand: Min assist, Contact guard assist                Ambulation/Gait Ambulation/Gait assistance: Min assist, Mod assist Gait Distance (Feet): 25 Feet Assistive device: Straight cane Gait Pattern/deviations: Step-to  pattern, Decreased step length - left, Decreased step length - right, Narrow base of support, Drifts right/left, Trendelenburg Gait velocity: decreased     General Gait Details: very unsteady which is not her based line per patient; pt destat to 90% with room ambulation on 3 L/min.; vc needed for proper  breatheing  Stairs            Wheelchair Mobility     Tilt Bed    Modified Rankin (Stroke Patients Only)       Balance Overall balance assessment: Needs assistance Sitting-balance support: Feet supported Sitting balance-Leahy Scale: Good     Standing balance support: During functional activity, No upper extremity supported Standing balance-Leahy Scale: Poor                               Pertinent Vitals/Pain Pain Assessment Pain Assessment: No/denies pain    Home Living Family/patient expects to be discharged to:: Private residence Living Arrangements: Alone Available Help at Discharge: Family Type of Home: Apartment Home Access: Stairs to enter   Secretary/administrator of Steps: 1   Home Layout: One level Home Equipment: Agricultural Consultant (2 wheels);Rollator (4 wheels);Cane - single point;Wheelchair - Manufacturing Systems Engineer      Prior Function Prior Level of Function : Independent/Modified Independent             Mobility Comments: use came with household navigation and modI for ADLs and self care task       Extremity/Trunk Assessment        Lower Extremity Assessment Lower Extremity Assessment: Generalized weakness    Cervical / Trunk Assessment Cervical / Trunk Assessment: Kyphotic  Communication   Communication Communication: No apparent difficulties    Cognition Arousal: Alert Behavior During Therapy: WFL for tasks assessed/performed   PT - Cognitive impairments: No apparent impairments                         Following commands: Intact       Cueing Cueing Techniques: Verbal cues     General Comments      Exercises     Assessment/Plan    PT Assessment Patient needs continued PT services  PT Problem List Decreased strength;Decreased range of motion;Decreased activity tolerance;Decreased balance;Decreased mobility;Decreased safety awareness;Obesity       PT Treatment Interventions Gait training;DME  instruction;Stair training;Functional mobility training;Therapeutic activities;Therapeutic exercise;Patient/family education;Neuromuscular re-education;Balance training    PT Goals (Current goals can be found in the Care Plan section)  Acute Rehab PT Goals Patient Stated Goal: Pt wants to go home with pet cat PT Goal Formulation: With patient Time For Goal Achievement: 02/03/24 Potential to Achieve Goals: Good    Frequency Min 2X/week     Co-evaluation               AM-PAC PT 6 Clicks Mobility  Outcome Measure Help needed turning from your back to your side while in a flat bed without using bedrails?: A Little Help needed moving from lying on your back to sitting on the side of a flat bed without using bedrails?: A Little Help needed moving to and from a bed to a chair (including a wheelchair)?: A Little Help needed standing up from a chair using your arms (e.g., wheelchair or bedside chair)?: A Little Help needed to walk in hospital room?: A Lot Help needed climbing 3-5 steps with a railing? : Total 6 Click Score: 15  End of Session Equipment Utilized During Treatment: Gait belt Activity Tolerance: Patient tolerated treatment well;Patient limited by fatigue Patient left: in bed;with call bell/phone within reach;with bed alarm set;with nursing/sitter in room Nurse Communication: Mobility status PT Visit Diagnosis: Muscle weakness (generalized) (M62.81);Other abnormalities of gait and mobility (R26.89);Difficulty in walking, not elsewhere classified (R26.2)    Time: 9163-9097 PT Time Calculation (min) (ACUTE ONLY): 26 min   Charges:   PT Evaluation $PT Eval Low Complexity: 1 Low   PT General Charges $$ ACUTE PT VISIT: 1 Visit         Sherlean Lesches DPT, PT    Sherlean A Eyvonne Burchfield 01/12/2024, 9:19 AM

## 2024-01-12 NOTE — Progress Notes (Signed)
 [facility] Progress Note  Patient: Nicole Austin    FMW:[id number]  DOA: 01/10/2024     Date of Service: the patient was seen and examined on 01/12/2024  Chief Complaint  Patient presents with   Shortness of Breath   Brief hospital course: Ebba Goll is a 63 y.o. female with medical history significant for COPD on as needed 2 L [place], bipolar 1 disorder being admitted to the hospital with several days of bilateral lower extremity edema, cough and dyspnea on exertion suspicious for combined acute exacerbation of COPD as well as new onset heart failure.  History is provided by the patient, says over the last few weeks she has had progressive significant swelling in her feet and legs, without pain.  She has no significant cardiac history, is not on diuretics.  In that same timeframe, she has developed a nonproductive wet sounding cough, and has been wheezing.  She denies any chest pain, fevers, chills, nausea, vomiting or any other concerns.  She was admitted to the hospital 2 days ago, but had to leave AMA to make arrangements for her pets and is now back with the same complaints.   11/16: Still little short of breath, on 3.5 L of oxygen .  Sodium stable at 127, creatinine stable also continuing IV diuresis. Checking respiratory panel  Assessment and Plan:  # Acute respiratory failure due to COPD and CHF exacerbation Patient has oxygen  at home which she uses as needed Continue supplemental O2 admission and gradually wean off -Ambulate with pulse ox to determine the need of continuous oxygen  for home. Treat COPD and CHF as below  # Acute COPD exacerbation Continue Breo Ellipta inhaler and Incruse Ellipta  inhaler DuoNeb every 6 hourly Solu-Medrol  40 mg IV daily Mucinex  600 twice daily Tussionex as needed for cough Checking respiratory panel to see other cause of COPD exacerbation as still having some wheeze  # Acute on chronic diastolic CHF exacerbation Mild lower  extremity edema BNP 636 elevated Continue Lasix  40 mg IV twice daily Fluid striction 1.5 L/day Monitor renal function and urine output TTE LVEF 60- 65%, no WMA, grade 1 DD  # Hypotonic hyponatremia due to volume overload/CHF Serum osmolality 264, low Na 126>127>127 Continue salt tablets 2 g twice daily x 3 days Continue fluid restriction 1.5 L/day Monitor sodium level daily  # Hypomagnesemia, magnesium  of 1.6 today. - Replete magnesium   # Left lower extremity cellulitis, patient was seen in the ED on 11/5 and patient was discharged on Bactrim  and Keflex  and naproxen  Mild tenderness, no erythema or any sign of infection It seems cellulitis is resolving Continue to monitor off antibiotics for now  # Nicotine  dependence, patient smokes cigarettes daily.  Smoking cessation counseling done.   There is no height or weight on file to calculate BMI.  Interventions:  Diet: Heart healthy diet, fluid striction 1.5 L/day DVT Prophylaxis: Subcutaneous Lovenox    Advance goals of care discussion: Full code  Family Communication: Discussed with patient  Disposition: PT recommending SNF but patient wants to go home with home health.   Subjective: Patient was seen and examined today.  Still having some shortness of breath.  She has chronic left leg lymphedema since her knee surgery.  No pain.  Physical Exam:   Vitals:   01/12/24 0730 01/12/24 1000 01/12/24 1013 01/12/24 1030  BP: 133/63 132/62  (!) 100/56  Pulse: 63 65  66  Resp: 19 13  16   Temp:   97.7 F (36.5 C)  TempSrc:   Oral   SpO2: 95% 97%  94%   General.  Morbidly obese lady, in no acute distress. Pulmonary.  Some scattered wheeze bilaterally, normal respiratory effort. CV.  Regular rate and rhythm, no JVD, rub or murmur. Abdomen.  Soft, nontender, nondistended, BS positive. CNS.  Alert and oriented .  No focal neurologic deficit. Extremities.  Trace LE edema,  pulses intact and symmetrical. LLE  lymphedema Psychiatry.  Judgment and insight appears normal.   Data Reviewed: I have personally reviewed and interpreted daily labs, tele strips, imagings as discussed above. I reviewed all nursing notes, pharmacy notes, vitals, pertinent old records I have discussed plan of care as described above with RN and patient/family.  CBC: Recent Labs  Lab 01/08/24 1034 01/10/24 1321 01/11/24 0344 01/12/24 0429  WBC 6.7 6.7 6.9 9.4  NEUTROABS 3.8  --   --   --   HGB 12.5 13.2 13.2 12.6  HCT 39.3 40.2 39.1 37.2  MCV 89.1 88.5 85.6 86.3  PLT 237 242 260 263   Basic Metabolic Panel: Recent Labs  Lab 01/08/24 1034 01/10/24 1321 01/11/24 0344 01/12/24 0949  NA 123* 126* 127* 127*  K 4.9 3.6 4.7 3.6  CL 87* 88* 88* 86*  CO2 30 27 29 30   GLUCOSE 93 86 118* 98  BUN 10 8 10 13   CREATININE 1.01* 0.88 0.86 0.74  CALCIUM  8.9 9.4 9.3 9.1  MG  --   --  1.6* 1.6*  PHOS  --   --  3.7 3.4    Studies: No results found.   Scheduled Meds:  ALPRAZolam   0.5 mg Oral TID   cyanocobalamin   1,000 mcg Oral Daily   divalproex   250 mg Oral BID   enoxaparin  (LOVENOX ) injection  50 mg Subcutaneous Q24H   ferrous sulfate   325 mg Oral QODAY   fluticasone furoate-vilanterol  1 puff Inhalation Daily   furosemide   40 mg Intravenous BID   guaiFENesin   600 mg Oral BID   ipratropium-albuterol   3 mL Nebulization QID   methylPREDNISolone  (SOLU-MEDROL ) injection  40 mg Intravenous Daily   primidone  50 mg Oral QHS   psyllium  1 packet Oral Daily   rOPINIRole   4 mg Oral QHS   sodium chloride   2 g Oral BID WC   umeclidinium bromide   1 puff Inhalation Daily   Continuous Infusions:  magnesium  sulfate bolus IVPB     PRN Meds: acetaminophen  **OR** acetaminophen , chlorpheniramine-HYDROcodone , morphine  injection, nitroGLYCERIN, traZODone   Time spent: 50 minutes  Author: Amaryllis Dare. MD [facility] 01/12/2024 12:30 PM  To reach On-call, see care teams to locate the attending and reach out to  them via [email or web address]. If 7PM-7AM, please contact night-coverage If you still have difficulty reaching the attending provider, please page the [facility] (Director on Call) for [facility] on amion for assistance.

## 2024-01-12 NOTE — Evaluation (Signed)
 Occupational Therapy Evaluation Patient Details Name: Nicole Austin MRN: [id number] DOB: August 12, 1960 Today's Date: 01/12/2024   History of Present Illness   Nicole Austin is a 63 y.o. female with medical history significant for COPD on as needed 2 L [place], bipolar 1 disorder being admitted to the hospital with several days of bilateral lower extremity edema, cough and dyspnea on exertion suspicious for combined acute exacerbation of COPD as well as new onset heart failure.  History is provided by the patient, says over the last few weeks she has had progressive significant swelling in her feet and legs, without pain.  She has no significant cardiac history, is not on diuretics.  In that same timeframe, she has developed a nonproductive wet sounding cough, and has been wheezing.  She denies any chest pain, fevers, chills, nausea, vomiting or any other concerns.  She was admitted to the hospital 2 days ago, but had to leave AMA to make arrangements for her pets and is now back with the same complaints.     Clinical Impressions Patient presenting with decreased Ind in self care,balance, functional mobility,transfers, endurance, and safety awareness.  Patient reports living at home in an apartment with use of [facility] for mobility and cares for her cat. Pt is eager to return home to her pet. Pt per forming bed mobility with supervision and no dizziness reported. No pain this session. Pt on 4 Ls O2 during this session. Pt stands with min guard and ambulates initially pushing IV pole to door and then using SPC and ambulating up and down room several times without LOB. O2 saturation at 91 % on 4Ls via [place]. Pt returning to supine at end of session. Patient will benefit from acute OT to increase overall independence in the areas of ADLs, functional mobility, and safety awareness in order to safely discharge.     If plan is discharge home, recommend the following:   A little help with walking and/or  transfers;A little help with bathing/dressing/bathroom;Assistance with cooking/housework;Assist for transportation;Help with stairs or ramp for entrance     Functional Status Assessment   Patient has had a recent decline in their functional status and demonstrates the ability to make significant improvements in function in a reasonable and predictable amount of time.       Precautions/Restrictions   Precautions Precautions: Fall     Mobility Bed Mobility Overal bed mobility: Needs Assistance Bed Mobility: Supine to Sit, Sit to Supine     Supine to sit: Supervision Sit to supine: Supervision        Transfers Overall transfer level: Needs assistance Equipment used: Straight cane Transfers: Sit to/from Stand Sit to Stand: Supervision                  Balance Overall balance assessment: Needs assistance Sitting-balance support: Feet supported Sitting balance-Leahy Scale: Good     Standing balance support: Single extremity supported Standing balance-Leahy Scale: Fair                             ADL either performed or assessed with clinical judgement   ADL Overall ADL's : Needs assistance/impaired                         Toilet Transfer: Supervision/safety;Ambulation Toilet Transfer Details (indicate cue type and reason): simulated with SPC         Functional mobility during ADLs: Supervision/safety;[employer]  Vision Patient Visual Report: No change from baseline              Pertinent Vitals/Pain Pain Assessment Pain Assessment: No/denies pain     Extremity/Trunk Assessment Upper Extremity Assessment Upper Extremity Assessment: Overall WFL for tasks assessed   Lower Extremity Assessment Lower Extremity Assessment: Overall WFL for tasks assessed       Communication Communication Communication: No apparent difficulties   Cognition Arousal: Alert Behavior During Therapy: WFL for tasks assessed/performed                                  Following commands: Intact       Cueing  General Comments   Cueing Techniques: Verbal cues              Home Living Family/patient expects to be discharged to:: Private residence Living Arrangements: Alone Available Help at Discharge: Friend(s);Available 24 hours/day Type of Home: Apartment Home Access: Stairs to enter Entrance Stairs-Number of Steps: 1   Home Layout: One level     Bathroom Shower/Tub: Chief Strategy Officer: Standard     Home Equipment: Agricultural Consultant (2 wheels);Rollator (4 wheels);Cane - single point;Wheelchair - manual;Shower seat   Additional Comments: Pt endorses best friend lives in apartment above her can stay with her for a few days when she returns home and can assist as needed      Prior Functioning/Environment Prior Level of Function : Independent/Modified Independent               ADLs Comments: Pt reports being Mod I with SPC for mobility and able to perform ADLs and IADLs    OT Problem List: Decreased strength;Decreased safety awareness;Decreased activity tolerance;Impaired balance (sitting and/or standing)   OT Treatment/Interventions: Self-care/ADL training;Therapeutic activities;Neuromuscular education;Patient/family education;Balance training      OT Goals(Current goals can be found in the care plan section)   Acute Rehab OT Goals Patient Stated Goal: to go home to cat OT Goal Formulation: With patient Time For Goal Achievement: 01/26/24 Potential to Achieve Goals: Fair ADL Goals Pt Will Perform Grooming: with modified independence;standing Pt Will Perform Lower Body Dressing: with modified independence;sit to/from stand Pt Will Transfer to Toilet: with modified independence;ambulating Pt Will Perform Toileting - Clothing Manipulation and hygiene: with modified independence;sit to/from stand   OT Frequency:  Min 2X/week       AM-PAC OT 6 Clicks Daily Activity      Outcome Measure Help from another person eating meals?: None Help from another person taking care of personal grooming?: None Help from another person toileting, which includes using toliet, bedpan, or urinal?: A Little Help from another person bathing (including washing, rinsing, drying)?: A Little Help from another person to put on and taking off regular upper body clothing?: None Help from another person to put on and taking off regular lower body clothing?: A Little 6 Click Score: 21   End of Session Equipment Utilized During Treatment: Other (comment) [facility]) Nurse Communication: Mobility status  Activity Tolerance: Patient tolerated treatment well Patient left: in bed;with call bell/phone within reach;with bed alarm set  OT Visit Diagnosis: Unsteadiness on feet (R26.81);Repeated falls (R29.6);Muscle weakness (generalized) (M62.81)                Time: 1429-1450 OT Time Calculation (min): 21 min Charges:  OT General Charges $OT Visit: 1 Visit OT Evaluation $OT Eval Moderate Complexity: 1 Mod OT Treatments $  Therapeutic Activity: 8-22 mins  Izetta Claude, MS, OTR/L , CBIS ascom [phone]  01/12/24, 3:14 PM

## 2024-01-12 NOTE — ED Notes (Signed)
 Patient walked in room with OT. Patient did require 4L [place] to maintain oxygen  saturation above 90%. Patient walked to and from the door multiple times and throughout the room.

## 2024-01-12 NOTE — ED Notes (Signed)
 Informed RN bed assigned

## 2024-01-12 NOTE — TOC Progression Note (Addendum)
 Transition of Care [facility]) - Progression Note    Patient Details  Name: Nicole Austin MRN: [id number] Date of Birth: [birth year]  Transition of Care [facility]) CM/SW Contact  Florentino Laabs L Glennette [place], [place] Phone Number: 01/12/2024, 9:17 AM  Clinical Narrative:     HHPT and OT set up with [facility] to begin 24 to 48 hours after discharge.                     Expected Discharge Plan and Services                                               Social Drivers of Health (SDOH) Interventions SDOH Screenings   Food Insecurity: No Food Insecurity (11/15/2023)  Housing: Low Risk  (11/15/2023)  Transportation Needs: No Transportation Needs (11/15/2023)  Utilities: Not At Risk (11/15/2023)  Alcohol Screen: Low Risk  (03/21/2023)  Depression (PHQ2-9): Low Risk  (11/15/2023)  Recent Concern: Depression (PHQ2-9) - Medium Risk (10/23/2023)  Financial Resource Strain: Low Risk  (08/08/2023)   Received from [facility]  Physical Activity: Insufficiently Active (03/21/2023)  Social Connections: Moderately Isolated (03/21/2023)  Stress: Stress Concern Present (03/21/2023)  Tobacco Use: High Risk (01/10/2024)  Health Literacy: Adequate Health Literacy (03/21/2023)    Readmission Risk Interventions    02/10/2023    9:52 AM  Readmission Risk Prevention Plan  Transportation Screening Complete  Medication Review (RN Care Manager) Complete  PCP or Specialist appointment within 3-5 days of discharge Complete  HRI or Home Care Consult Complete  SW Recovery Care/Counseling Consult Complete  Palliative Care Screening Not Applicable  Skilled Nursing Facility Complete

## 2024-01-13 ENCOUNTER — Telehealth (HOSPITAL_COMMUNITY): Payer: Self-pay | Admitting: Pharmacy Technician

## 2024-01-13 ENCOUNTER — Other Ambulatory Visit (HOSPITAL_COMMUNITY): Payer: Self-pay

## 2024-01-13 ENCOUNTER — Other Ambulatory Visit: Payer: Self-pay

## 2024-01-13 DIAGNOSIS — I5033 Acute on chronic diastolic (congestive) heart failure: Secondary | ICD-10-CM | POA: Diagnosis not present

## 2024-01-13 DIAGNOSIS — I5031 Acute diastolic (congestive) heart failure: Secondary | ICD-10-CM | POA: Diagnosis not present

## 2024-01-13 DIAGNOSIS — R0602 Shortness of breath: Secondary | ICD-10-CM | POA: Diagnosis not present

## 2024-01-13 DIAGNOSIS — J441 Chronic obstructive pulmonary disease with (acute) exacerbation: Secondary | ICD-10-CM | POA: Diagnosis not present

## 2024-01-13 LAB — GLUCOSE, CAPILLARY: Glucose-Capillary: 107 mg/dL — ABNORMAL HIGH (ref 70–99)

## 2024-01-13 LAB — BASIC METABOLIC PANEL WITH GFR
Anion gap: 9 (ref 5–15)
BUN: 15 mg/dL (ref 8–23)
CO2: 33 mmol/L — ABNORMAL HIGH (ref 22–32)
Calcium: 8.9 mg/dL (ref 8.9–10.3)
Chloride: 85 mmol/L — ABNORMAL LOW (ref 98–111)
Creatinine, Ser: 0.75 mg/dL (ref 0.44–1.00)
GFR, Estimated: >60 mL/min (ref >60)
Glucose, Bld: 98 mg/dL (ref 70–99)
Potassium: 3.4 mmol/L — ABNORMAL LOW (ref 3.5–5.1)
Sodium: 127 mmol/L — ABNORMAL LOW (ref 135–145)

## 2024-01-13 LAB — MAGNESIUM: Magnesium: 1.9 mg/dL (ref 1.7–2.4)

## 2024-01-13 MED ORDER — POTASSIUM CHLORIDE CRYS ER 20 MEQ PO TBCR
40.0000 meq | EXTENDED_RELEASE_TABLET | Freq: Once | ORAL | Status: AC
  Administered 2024-01-13: 40 meq via ORAL
  Filled 2024-01-13: qty 2

## 2024-01-13 MED ORDER — PREDNISONE 50 MG PO TABS
50.0000 mg | ORAL_TABLET | Freq: Every day | ORAL | 0 refills | Status: AC
  Filled 2024-01-13: qty 3, 3d supply, fill #0

## 2024-01-13 MED ORDER — TORSEMIDE 20 MG PO TABS
20.0000 mg | ORAL_TABLET | Freq: Every day | ORAL | 0 refills | Status: DC
  Filled 2024-01-13: qty 30, 30d supply, fill #0

## 2024-01-13 NOTE — Telephone Encounter (Signed)
 Patient Product/process Development Scientist completed.    The patient is insured through [facility]. Patient has Medicare and is not eligible for a copay card, but may be able to apply for patient assistance or Medicare RX Payment Plan (Patient Must reach out to their plan, if eligible for payment plan), if available.  Ran test claim for Farxiga 10 mg and the current 30 day co-pay is $0.00.  Ran test claim for Jardiance 10 mg and the current 30 day co-pay is $0.00.    This test claim was processed through [facility]- copay amounts may vary at other pharmacies due to pharmacy/plan contracts, or as the patient moves through the different stages of their insurance plan.     Reyes Sharps, CPHT Pharmacy Technician Patient Advocate Specialist Lead [facility] Patient Advocate Team Direct Number: [phone]  Fax: [phone]

## 2024-01-13 NOTE — Discharge Summary (Signed)
 Physician Discharge Summary   Patient: Nicole Austin MRN: [id number] DOB: [birth year]  Admit date:     01/10/2024  Discharge date: 01/13/24  Discharge Physician: Amaryllis Dare   PCP: Leavy Mole, PA-C   Recommendations at discharge:  Please obtain CBC and BMP on follow-up Patient is being started on torsemide 20 mg daily-Will need dose titration. Need to monitor sodium-seems like having chronic hyponatremia. Follow-up with primary care provider  Discharge Diagnoses: Principal Problem:   Acute diastolic (congestive) heart failure (HCC) Active Problems:   Shortness of breath  Hospital Course: Nicole Austin is a 63 y.o. female with medical history significant for COPD on as needed 2 L [place], bipolar 1 disorder being admitted to the hospital with several days of bilateral lower extremity edema, cough and dyspnea on exertion suspicious for combined acute exacerbation of COPD as well as new onset heart failure.  History is provided by the patient, says over the last few weeks she has had progressive significant swelling in her feet and legs, without pain.  She has no significant cardiac history, is not on diuretics.  In that same timeframe, she has developed a nonproductive wet sounding cough, and has been wheezing.  She denies any chest pain, fevers, chills, nausea, vomiting or any other concerns.  She was admitted to the hospital 2 days ago, but had to leave AMA to make arrangements for her pets and is now back with the same complaints.   On presentation patient was requiring more oxygen  than her baseline.  Admitted for acute or chronic hypoxic respiratory secondary to COPD and CHF exacerbation.  Echocardiogram with normal EF and grade 1 diastolic dysfunction.  Patient was treated with IV Lasix , renal function remained stable, recommendations were to continue IV diuresis but patient requested discharge so she was transition to p.o. torsemide and will need a close follow-up with her  physicians for further assistance as does need titration.  We had a lengthy discussion about her stay but she continued to refuse and requesting discharge.  She was also given Solu-Medrol  while in the hospital and is being discharged on 3 more days of prednisone .  She will continue using her inhalers at home.  Wheezing was much improved and she was on baseline oxygen  use of 2 L before discharge.  Patient also has some acute on chronic hypotonic hyponatremia, sodium pretty much stable at 127.  She did receive some salt tablets and IV Lasix .  She should continue with free water  restriction and her PCP can follow-up.  Might have some element of SIADH with her advanced COPD.  Patient was also found to have hypomagnesemia which was repleted and she will continue with her p.o. magnesium  supplement.  Patient recently treated for left lower extremity cellulitis, currently seems improved.  She can complete her course of antibiotic.  Patient was counseled for smoking cessation and need continuation of counseling by PCP.  She will continue on current medications and need to have a close follow-up with her providers for further assistance.   Consultants: None Procedures performed: None Disposition: Home health Diet recommendation:  Discharge Diet Orders (From admission, onward)     Start     Ordered   01/13/24 0000  Diet - low sodium heart healthy        01/13/24 1244           Regular diet DISCHARGE MEDICATION: Allergies as of 01/13/2024       Reactions   Glycopyrronium Dermatitis   glycopyrronium  Chantix  [varenicline ] Nausea Only   Glycopyrrolate  Rash   Oral irritation        Medication List     STOP taking these medications    diclofenac  75 MG EC tablet Commonly known as: VOLTAREN    lurasidone  20 MG Tabs tablet Commonly known as: LATUDA    oxyCODONE  5 MG immediate release tablet Commonly known as: Oxy IR/ROXICODONE        TAKE these medications    albuterol  108  (90 Base) MCG/ACT inhaler Commonly known as: VENTOLIN  HFA INHALE TWO PUFFS INTO THE LUNGS EVERY SIX  HOURS AS NEEDED FOR WHEEZING OR SHORTNESS OF BREATH.   ALPRAZolam  0.5 MG tablet Commonly known as: XANAX  Take 1 tablet (0.5 mg total) by mouth 2 (two) times daily.   cyanocobalamin  1000 MCG tablet Commonly known as: VITAMIN B12 Take 1 tablet (1,000 mcg total) by mouth daily.   divalproex  250 MG DR tablet Commonly known as: DEPAKOTE  Take 1 tablet (250 mg total) by mouth 2 (two) times daily.   escitalopram  20 MG tablet Commonly known as: LEXAPRO  Take 20 mg by mouth daily.   Incruse Ellipta  62.5 MCG/ACT Aepb Generic drug: umeclidinium bromide  Inhale 1 puff into the lungs daily.   Iron  (Ferrous Sulfate ) 325 (65 Fe) MG Tabs Take 325 mg by mouth every other day.   magnesium  oxide 400 (240 Mg) MG tablet Commonly known as: MAG-OX TAKE ONE TABLET (400 MG TOTAL) BY MOUTH DAILY.   miconazole  2 % powder Commonly known as: MICOTIN Apply topically 2 (two) times daily as needed for itching (Candidal intertrigo).   mometasone -formoterol  100-5 MCG/ACT Aero Commonly known as: DULERA  Inhale 2 puffs into the lungs 2 (two) times daily.   multivitamin with minerals tablet Take 1 tablet by mouth daily.   naproxen  500 MG tablet Commonly known as: Naprosyn  Take 1 tablet (500 mg total) by mouth 2 (two) times daily with a meal.   nystatin  powder Commonly known as: MYCOSTATIN /NYSTOP  Apply 1 Application topically 3 (three) times daily as needed (yeast/fungal rashes or intertrigo).   predniSONE  50 MG tablet Commonly known as: DELTASONE  Take 1 tablet (50 mg total) by mouth daily with breakfast for 3 days.   primidone 50 MG tablet Commonly known as: MYSOLINE Take 50 mg by mouth in the morning and at bedtime.   promethazine  25 MG tablet Commonly known as: PHENERGAN  Take 25 mg by mouth every 6 (six) hours as needed for nausea or vomiting.   psyllium 58.6 % powder Commonly known as:  METAMUCIL Take 1 packet by mouth as needed.   rOPINIRole  4 MG 24 hr tablet Commonly known as: REQUIP  XL Take 4 mg by mouth at bedtime.   sulfamethoxazole -trimethoprim  800-160 MG tablet Commonly known as: BACTRIM  DS Take 1 tablet by mouth 2 (two) times daily.   tiZANidine  2 MG tablet Commonly known as: ZANAFLEX  TAKE ONE (1) TO TWO (2) TABLETS (2-4 MG TOTAL) BY MOUTH EVERY EIGHT HOURS AS NEEDED FOR MUSCLE SPASMS.   torsemide 20 MG tablet Commonly known as: DEMADEX Take 1 tablet (20 mg total) by mouth daily.        Contact information for follow-up providers     Leavy Mole, PA-C. Schedule an appointment as soon as possible for a visit in 1 week(s).   Specialty: Family Medicine Contact information: [address] [place] [address] [phone]              Contact information for after-discharge care     Home Medical Care  [facility] [place] .   Service: Home Health Services Contact information: [phone] [place]  [address] [phone]                    Discharge Exam: There were no vitals filed for this visit. General. Obese lady, in no acute distress. Pulmonary.  Few basal crackles bilaterally, normal respiratory effort. CV.  Regular rate and rhythm, no JVD, rub or murmur. Abdomen.  Soft, nontender, nondistended, BS positive. CNS.  Alert and oriented .  No focal neurologic deficit. Extremities.  Trace LE edema, pulses intact and symmetrical. Psychiatry.  Judgment and insight appears normal.   Condition at discharge: stable  The results of significant diagnostics from this hospitalization (including imaging, microbiology, ancillary and laboratory) are listed below for reference.   Imaging Studies: ECHOCARDIOGRAM COMPLETE Result Date: 01/11/2024    ECHOCARDIOGRAM REPORT   Patient Name:   PAOLA ALESHIRE Feely Date of Exam: 01/11/2024 Medical Rec #:  [id number]            Height:       61.0 in  Accession #:    [id number]           Weight:       235.0 lb Date of Birth:  16-Feb-1961             BSA:          2.023 m Patient Age:    63 years             BP:           146/98 mmHg Patient Gender: F                    HR:           75 bpm. Exam Location:  [facility] Procedure: 2D Echo, 3D Echo, Cardiac Doppler, Color Doppler and Strain Analysis            (Both Spectral and Color Flow Doppler were utilized during            procedure). Indications:     CHF-Acute Diastolic  History:         Patient has prior history of Echocardiogram examinations, most                  recent 03/26/2015. Previous Myocardial Infarction, COPD and                  Stroke; Risk Factors:Sleep Apnea, Dyslipidemia and Current                  Smoker.  Sonographer:     Logan Shove RDCS Referring Phys:  [id number] MIR CHRISTELLA [facility] Diagnosing Phys: Cara JONETTA Lovelace MD  Sonographer Comments: Suboptimal parasternal window. IMPRESSIONS  1. Left ventricular ejection fraction, by estimation, is 60 to 65%. The left ventricle has normal function. The left ventricle has no regional wall motion abnormalities. Left ventricular diastolic parameters are consistent with Grade I diastolic dysfunction (impaired relaxation). The average left ventricular global longitudinal strain is 19.7 %. The global longitudinal strain is normal.  2. Right ventricular systolic function is normal. The right ventricular size is normal.  3. Left atrial size was mildly dilated.  4. The mitral valve is normal in structure. Trivial mitral valve regurgitation.  5. The aortic valve is normal in structure. Aortic valve regurgitation is not visualized. FINDINGS  Left Ventricle: Left ventricular ejection fraction, by estimation, is 60 to 65%. The  left ventricle has normal function. The left ventricle has no regional wall motion abnormalities. The average left ventricular global longitudinal strain is 19.7 %. Strain was performed and the global longitudinal strain is normal. The left  ventricular internal cavity size was normal in size. There is no left ventricular hypertrophy. Left ventricular diastolic parameters are consistent with Grade I diastolic dysfunction (impaired relaxation). Right Ventricle: The right ventricular size is normal. No increase in right ventricular wall thickness. Right ventricular systolic function is normal. Left Atrium: Left atrial size was mildly dilated. Right Atrium: Right atrial size was normal in size. Pericardium: There is no evidence of pericardial effusion. Mitral Valve: The mitral valve is normal in structure. Trivial mitral valve regurgitation. Tricuspid Valve: The tricuspid valve is normal in structure. Tricuspid valve regurgitation is trivial. Aortic Valve: The aortic valve is normal in structure. Aortic valve regurgitation is not visualized. Aortic valve peak gradient measures 5.6 mmHg. Pulmonic Valve: The pulmonic valve was normal in structure. Pulmonic valve regurgitation is not visualized. Aorta: The ascending aorta was not well visualized. IAS/Shunts: No atrial level shunt detected by color flow Doppler. Additional Comments: 3D was performed not requiring image post processing on an independent workstation and was normal.  LEFT VENTRICLE PLAX 2D LVIDd:         4.80 cm   Diastology LVIDs:         3.30 cm   LV e' medial:    6.53 cm/s LV PW:         1.00 cm   LV E/e' medial:  13.8 LV IVS:        1.10 cm   LV e' lateral:   7.72 cm/s LVOT diam:     1.90 cm   LV E/e' lateral: 11.7 LVOT Area:     2.84 cm                          2D Longitudinal Strain                          2D Strain GLS (A4C):   17.6 %                          2D Strain GLS (A3C):   21.3 %                          2D Strain GLS (A2C):   20.3 %                          2D Strain GLS Avg:     19.7 %                           3D Volume EF:                          3D EF:        58 %                          LV EDV:       118 ml                          LV ESV:  50 ml                           LV SV:        68 ml RIGHT VENTRICLE             IVC RV Basal diam:  3.60 cm     IVC diam: 1.20 cm RV S prime:     11.20 cm/s TAPSE (M-mode): 2.1 cm LEFT ATRIUM             Index        RIGHT ATRIUM           Index LA diam:        4.00 cm 1.98 cm/m   RA Area:     20.20 cm LA Vol (A2C):   52.0 ml 25.71 ml/m  RA Volume:   61.00 ml  30.16 ml/m LA Vol (A4C):   47.8 ml 23.63 ml/m LA Biplane Vol: 51.0 ml 25.21 ml/m  AORTIC VALVE AV Area (Vmax): 2.30 cm AV Vmax:        118.00 cm/s AV Peak Grad:   5.6 mmHg LVOT Vmax:      95.90 cm/s  AORTA Ao Root diam: 2.30 cm Ao Asc diam:  2.80 cm MITRAL VALVE MV Area (PHT): 3.54 cm    SHUNTS MV Decel Time: 214 msec    Systemic Diam: 1.90 cm MV E velocity: 90.30 cm/s MV A velocity: 97.00 cm/s MV E/A ratio:  0.93 Dwayne D Callwood MD Electronically signed by Cara JONETTA Lovelace MD Signature Date/Time: 01/11/2024/11:25:18 AM    Final    DG Chest 2 View Result Date: 01/10/2024 EXAM: 2 VIEW(S) XRAY OF THE CHEST 01/10/2024 12:45:48 PM COMPARISON: 2 days ago. CLINICAL HISTORY: shortness of breath FINDINGS: LUNGS AND PLEURA: No focal pulmonary opacity. No pleural effusion. No pneumothorax. HEART AND MEDIASTINUM: No acute abnormality of the cardiac and mediastinal silhouettes. BONES AND SOFT TISSUES: No acute osseous abnormality. IMPRESSION: 1. No acute cardiopulmonary process. Electronically signed by: Lynwood Seip MD 01/10/2024 01:05 PM EST RP Workstation: [id number]   DG Chest Portable 1 View Result Date: 01/08/2024 CLINICAL DATA:  Shortness of breath. EXAM: PORTABLE CHEST 1 VIEW COMPARISON:  02/08/2023 FINDINGS: Lungs are adequately inflated with minimal hazy density over the lateral left base possibly due to overlying soft tissues although atelectasis or early infection is possible. Cardiomediastinal silhouette and remainder of the exam is unchanged. IMPRESSION: Minimal hazy density over the lateral left base which may be due to overlying soft tissues, although atelectasis  or early infection is possible. Consider PA and lateral chest radiograph for better evaluation. Electronically Signed   By: Toribio Agreste M.D.   On: 01/08/2024 12:45   US  Venous Img Lower Bilateral Result Date: 12/21/2023 CLINICAL DATA:  Lower extremity swelling, left greater than right. EXAM: BILATERAL LOWER EXTREMITY VENOUS DOPPLER ULTRASOUND TECHNIQUE: Gray-scale sonography with compression, as well as color and duplex ultrasound, were performed to evaluate the deep venous system(s) from the level of the common femoral vein through the popliteal and proximal calf veins. COMPARISON:  10/27/2015. FINDINGS: VENOUS Normal compressibility of the common femoral, superficial femoral, and popliteal veins, as well as the visualized calf veins. Visualized portions of profunda femoral vein and great saphenous vein unremarkable. No filling defects to suggest DVT on grayscale or color Doppler imaging. Doppler waveforms show normal direction of venous flow, normal respiratory plasticity and response to augmentation. Limited views of the contralateral common femoral vein are  unremarkable. OTHER Subcutaneous edema is noted at the left calf. Limitations: none IMPRESSION: No evidence of deep venous thrombosis in the bilateral lower extremities. Electronically Signed   By: Leita Birmingham M.D.   On: 12/21/2023 16:29    Microbiology: Results for orders placed or performed during the hospital encounter of 01/10/24  Respiratory (~20 pathogens) panel by PCR     Status: None   Collection Time: 01/12/24 12:35 PM   Specimen: Nasopharyngeal Swab; Respiratory  Result Value Ref Range Status   Adenovirus NOT DETECTED NOT DETECTED Final   Coronavirus 229E NOT DETECTED NOT DETECTED Final    Comment: (NOTE) The Coronavirus on the Respiratory Panel, DOES NOT test for the novel  Coronavirus (2019 nCoV)    Coronavirus HKU1 NOT DETECTED NOT DETECTED Final   Coronavirus NL63 NOT DETECTED NOT DETECTED Final   Coronavirus OC43 NOT  DETECTED NOT DETECTED Final   Metapneumovirus NOT DETECTED NOT DETECTED Final   Rhinovirus / Enterovirus NOT DETECTED NOT DETECTED Final   Influenza A NOT DETECTED NOT DETECTED Final   Influenza B NOT DETECTED NOT DETECTED Final   Parainfluenza Virus 1 NOT DETECTED NOT DETECTED Final   Parainfluenza Virus 2 NOT DETECTED NOT DETECTED Final   Parainfluenza Virus 3 NOT DETECTED NOT DETECTED Final   Parainfluenza Virus 4 NOT DETECTED NOT DETECTED Final   Respiratory Syncytial Virus NOT DETECTED NOT DETECTED Final   Bordetella pertussis NOT DETECTED NOT DETECTED Final   Bordetella Parapertussis NOT DETECTED NOT DETECTED Final   Chlamydophila pneumoniae NOT DETECTED NOT DETECTED Final   Mycoplasma pneumoniae NOT DETECTED NOT DETECTED Final    Comment: Performed at [facility], [address], [place], [place] [address]  Resp panel by RT-PCR (RSV, Flu A&B, Covid) Anterior Nasal Swab     Status: None   Collection Time: 01/12/24 12:35 PM   Specimen: Anterior Nasal Swab  Result Value Ref Range Status   SARS Coronavirus 2 by RT PCR NEGATIVE NEGATIVE Final    Comment: (NOTE) SARS-CoV-2 target nucleic acids are NOT DETECTED.  The SARS-CoV-2 RNA is generally detectable in upper respiratory specimens during the acute phase of infection. The lowest concentration of SARS-CoV-2 viral copies this assay can detect is 138 copies/mL. A negative result does not preclude SARS-Cov-2 infection and should not be used as the sole basis for treatment or other patient management decisions. A negative result may occur with  improper specimen collection/handling, submission of specimen other than nasopharyngeal swab, presence of viral mutation(s) within the areas targeted by this assay, and inadequate number of viral copies(<138 copies/mL). A negative result must be combined with clinical observations, patient history, and epidemiological information. The expected result is Negative.  Fact Sheet for  Patients:  bloggercourse.com  Fact Sheet for Healthcare Providers:  seriousbroker.it  This test is no t yet approved or cleared by the [place]  FDA and  has been authorized for detection and/or diagnosis of SARS-CoV-2 by FDA under an Emergency Use Authorization (EUA). This EUA will remain  in effect (meaning this test can be used) for the duration of the COVID-19 declaration under Section 564(b)(1) of the Act, 21 U.S.C.section 360bbb-3(b)(1), unless the authorization is terminated  or revoked sooner.       Influenza A by PCR NEGATIVE NEGATIVE Final   Influenza B by PCR NEGATIVE NEGATIVE Final    Comment: (NOTE) The Xpert Xpress SARS-CoV-2/FLU/RSV plus assay is intended as an aid in the diagnosis of influenza from Nasopharyngeal swab specimens and should not be  used as a sole basis for treatment. Nasal washings and aspirates are unacceptable for Xpert Xpress SARS-CoV-2/FLU/RSV testing.  Fact Sheet for Patients: bloggercourse.com  Fact Sheet for Healthcare Providers: seriousbroker.it  This test is not yet approved or cleared by the [place]  FDA and has been authorized for detection and/or diagnosis of SARS-CoV-2 by FDA under an Emergency Use Authorization (EUA). This EUA will remain in effect (meaning this test can be used) for the duration of the COVID-19 declaration under Section 564(b)(1) of the Act, 21 U.S.C. section 360bbb-3(b)(1), unless the authorization is terminated or revoked.     Resp Syncytial Virus by PCR NEGATIVE NEGATIVE Final    Comment: (NOTE) Fact Sheet for Patients: bloggercourse.com  Fact Sheet for Healthcare Providers: seriousbroker.it  This test is not yet approved or cleared by the [place]  FDA and has been authorized for detection and/or diagnosis of SARS-CoV-2 by FDA under an Emergency  Use Authorization (EUA). This EUA will remain in effect (meaning this test can be used) for the duration of the COVID-19 declaration under Section 564(b)(1) of the Act, 21 U.S.C. section 360bbb-3(b)(1), unless the authorization is terminated or revoked.  Performed at [facility], [address] [place], [place] [address]    *Note: Due to a large number of results and/or encounters for the requested time period, some results have not been displayed. A complete set of results can be found in Results Review.    Labs: CBC: Recent Labs  Lab 01/08/24 1034 01/10/24 1321 01/11/24 0344 01/12/24 0429  WBC 6.7 6.7 6.9 9.4  NEUTROABS 3.8  --   --   --   HGB 12.5 13.2 13.2 12.6  HCT 39.3 40.2 39.1 37.2  MCV 89.1 88.5 85.6 86.3  PLT 237 242 260 263   Basic Metabolic Panel: Recent Labs  Lab 01/08/24 1034 01/10/24 1321 01/11/24 0344 01/12/24 0949 01/13/24 0940  NA 123* 126* 127* 127* 127*  K 4.9 3.6 4.7 3.6 3.4*  CL 87* 88* 88* 86* 85*  CO2 30 27 29 30  33*  GLUCOSE 93 86 118* 98 98  BUN 10 8 10 13 15   CREATININE 1.01* 0.88 0.86 0.74 0.75  CALCIUM  8.9 9.4 9.3 9.1 8.9  MG  --   --  1.6* 1.6* 1.9  PHOS  --   --  3.7 3.4  --    Liver Function Tests: Recent Labs  Lab 01/08/24 1034  AST 15  ALT 6  ALKPHOS 90  BILITOT 0.2  PROT 6.2*  ALBUMIN 3.6   CBG: Recent Labs  Lab 01/13/24 0142  GLUCAP 107*    Discharge time spent: greater than 30 minutes.  This record has been created using Conservation officer, historic buildings. Errors have been sought and corrected,but may not always be located. Such creation errors do not reflect on the standard of care.   Signed: Amaryllis Dare, MD [facility] 01/13/2024

## 2024-01-13 NOTE — Plan of Care (Signed)

## 2024-01-14 ENCOUNTER — Other Ambulatory Visit: Payer: Self-pay | Admitting: Family Medicine

## 2024-01-14 LAB — MISC LABCORP TEST (SEND OUT): Labcorp test code: 83935

## 2024-01-16 ENCOUNTER — Telehealth: Payer: Self-pay

## 2024-01-16 ENCOUNTER — Other Ambulatory Visit: Payer: Self-pay | Admitting: Family Medicine

## 2024-01-16 DIAGNOSIS — J441 Chronic obstructive pulmonary disease with (acute) exacerbation: Secondary | ICD-10-CM

## 2024-01-16 NOTE — Telephone Encounter (Signed)
 Copied from CRM #[id number]. Topic: Clinical - Medication Refill >> Jan 16, 2024 12:29 PM Amy B wrote: Medication:  albuterol  (VENTOLIN  HFA) 108 (90 Base) MCG/ACT inhaler  umeclidinium bromide  (INCRUSE ELLIPTA ) 62.5 MCG/ACT AEPB  Has the patient contacted their pharmacy? Yes (Agent: If no, request that the patient contact the pharmacy for the refill. If patient does not wish to contact the pharmacy document the reason why and proceed with request.) (Agent: If yes, when and what did the pharmacy advise?)  This is the patient's preferred pharmacy:  [facility] - [place], [place] - [address] [place] [address] Phone: [phone] Fax: [phone]  Is this the correct pharmacy for this prescription? Yes If no, delete pharmacy and type the correct one.   Has the prescription been filled recently? No  Is the patient out of the medication? Yes  Has the patient been seen for an appointment in the last year OR does the patient have an upcoming appointment? Yes  Can we respond through MyChart? No Prefers phone call  Agent: Please be advised that Rx refills may take up to 3 business days. We ask that you follow-up with your pharmacy.

## 2024-01-16 NOTE — Telephone Encounter (Signed)
 Copied from CRM #[id number]. Topic: Clinical - Medication Refill >> Jan 16, 2024  9:26 AM Tiffini S wrote: Medication:  albuterol  (VENTOLIN  HFA) 108 (90 Base) MCG/ACT inhaler, Spiriva  ACT 2.5mg  inhaler , Ipratropium B Albuterol  0.5mg  solution  Has the patient contacted their pharmacy? Yes (Agent: If no, request that the patient contact the pharmacy for the refill. If patient does not wish to contact the pharmacy document the reason why and proceed with request.) (Agent: If yes, when and what did the pharmacy advise?)  This is the patient's preferred pharmacy:  [facility] - [place], [place] - [address] [place] [address] Phone: [phone] Fax: [phone]  Is this the correct pharmacy for this prescription? Yes If no, delete pharmacy and type the correct one.   Has the prescription been filled recently? Yes  Is the patient out of the medication? Yes-  patient is out or expired   Has the patient been seen for an appointment in the last year OR does the patient have an upcoming appointment? Yes  Can we respond through MyChart? No, please call the patient at [phone]  Agent: Please be advised that Rx refills may take up to 3 business days. We ask that you follow-up with your pharmacy.

## 2024-01-16 NOTE — Telephone Encounter (Signed)
 Also requesting spiriva  and ipratropium/abuterol solution for nebulizer, not on profile

## 2024-01-16 NOTE — Patient Outreach (Signed)
 RNCM - called patient and scheduled foolow up after hospitalization. Patent reports called pharmacy and called PCP office, still in needs of neb med and inhaler. High Priority message sent to PCP and patient aware to all me if no refill by noon tomorrow.

## 2024-01-16 NOTE — Telephone Encounter (Signed)
 Copied from CRM #[id number]. Topic: Clinical - Medication Question >> Jan 16, 2024 12:35 PM Amy B wrote: Reason for CRM: Patient states she needs a refill of spiriva ; however, that is not noted on her med list.  Please advise.

## 2024-01-17 NOTE — Telephone Encounter (Signed)
 Requested medications are due for refill today.  yes  Requested medications are on the active medications list.  yes  Last refill. 08/30/2023 8.5/3 rf  Future visit scheduled.   yes  Notes to clinic.  Pt is requesting a medication not on med list. Also requesting spiriva  and ipratropium/abuterol solution for nebulizer, not on profile     Requested Prescriptions  Pending Prescriptions Disp Refills   albuterol  (VENTOLIN  HFA) 108 (90 Base) MCG/ACT inhaler 8.5 g 3     Pulmonology:  Beta Agonists 2 Passed - 01/17/2024  5:31 PM      Passed - Last BP in normal range    BP Readings from Last 1 Encounters:  01/13/24 (!) 113/55         Passed - Last Heart Rate in normal range    Pulse Readings from Last 1 Encounters:  01/13/24 73         Passed - Valid encounter within last 12 months    Recent Outpatient Visits           1 week ago    [facility] Gareth Mliss FALCON, FNP   4 months ago Mixed incontinence urge and stress   [facility] Leavy Mole, PA-C   4 months ago Left-sided low back pain with left-sided sciatica, unspecified chronicity   [facility] Leavy Mole, PA-C   5 months ago Muscle cramps   [facility] Leavy Mole, PA-C   6 months ago Candidal intertrigo   [facility] [place]avy Mole, [place]       Future Appointments             In 2 weeks Marilynne, Rosaline SAILOR, MD [facility]rogynecology at [facility], [facility]

## 2024-01-17 NOTE — Telephone Encounter (Signed)
 Requested medications are due for refill today.  Please review  Requested medications are on the active medications list.  no  Last refill. never  Future visit scheduled.   yes  Notes to clinic.  Nicole Austin is PCP. Meds not on med list    Requested Prescriptions  Pending Prescriptions Disp Refills   ipratropium-albuterol  (DUONEB) 0.5-2.5 (3) MG/3ML SOLN [Pharmacy Med Name: IPRATROPIUM BROMIDE/ALBUTEROL  SULFATE ALBUTER SOLUTION]  1    Sig: TAKE 3 MLS BY NEBULIZATION 3 TIMES DAILY AS NEEDED     Pulmonology:  Combination Products - albuterol  / ipratropium Passed - 01/17/2024 11:07 AM      Passed - Last BP in normal range    BP Readings from Last 1 Encounters:  01/13/24 (!) 113/55         Passed - Last Heart Rate in normal range    Pulse Readings from Last 1 Encounters:  01/13/24 73         Passed - Valid encounter within last 12 months    Recent Outpatient Visits           1 week ago    [facility] Gareth Mliss FALCON, FNP   4 months ago Mixed incontinence urge and stress   [facility] [name], PA-C   4 months ago Left-sided low back pain with left-sided sciatica, unspecified chronicity   [facility] [name], PA-C   5 months ago Muscle cramps   [facility] [name], PA-C   6 months ago Candidal intertrigo   [facility] [name], [place]       Future Appointments             In 2 weeks Nicole Rosaline SAILOR, MD [facility] Urogynecology at [facility], [facility]             SPIRIVA  RESPIMAT 2.5 MCG/ACT AERS [Pharmacy Med Name: SPIRIVA  RESPIMAT 2.5MCG AEROSOL SOLN]  0    Sig: INHALE 2 PUFFS INTO THE LUNGS DAILY     Pulmonology:  Anticholinergic Agents Passed - 01/17/2024 11:07 AM      Passed - Valid encounter within last 12 months    Recent Outpatient Visits           1 week ago    [facility] Gareth Mliss FALCON, FNP   4 months ago Mixed incontinence urge and stress   [facility] [name], PA-C   4 months ago Left-sided low back pain with left-sided sciatica, unspecified chronicity   [facility] [name], PA-C   5 months ago Muscle cramps   [facility] [name], PA-C   6 months ago Candidal intertrigo   [facility] [name], [place]       Future Appointments             In 2 weeks Nicole, Rosaline SAILOR, MD [facility] Urogynecology at [facility], [facility]

## 2024-01-17 NOTE — Telephone Encounter (Signed)
 Not on current med list? Has appt 11/26 with you

## 2024-01-18 NOTE — Telephone Encounter (Signed)
 Requested medication (s) are due for refill today: yes  Requested medication (s) are on the active medication list: yes  Last refill:  08/30/23 and 02/11/23  Future visit scheduled: yes  Notes to clinic:  Unable to refill per protocol, last refill by another provider.      Requested Prescriptions  Pending Prescriptions Disp Refills   albuterol  (VENTOLIN  HFA) 108 (90 Base) MCG/ACT inhaler 8.5 g 3     Pulmonology:  Beta Agonists 2 Passed - 01/18/2024  8:49 AM      Passed - Last BP in normal range    BP Readings from Last 1 Encounters:  01/13/24 (!) 113/55         Passed - Last Heart Rate in normal range    Pulse Readings from Last 1 Encounters:  01/13/24 73         Passed - Valid encounter within last 12 months    Recent Outpatient Visits           1 week ago    [facility] Gareth Mliss FALCON, FNP   4 months ago Mixed incontinence urge and stress   [facility] Leavy Mole, PA-C   4 months ago Left-sided low back pain with left-sided sciatica, unspecified chronicity   [facility] Leavy Mole, PA-C   5 months ago Muscle cramps   [facility] Leavy Mole, PA-C   6 months ago Candidal intertrigo   [facility] Leavy Mole, PA-C       Future Appointments             In 1 week Marilynne Rosaline SAILOR, MD [facility] Urogynecology at [facility], [facility]             umeclidinium bromide  (INCRUSE ELLIPTA ) 62.5 MCG/ACT AEPB 90 each 0    Sig: Inhale 1 puff into the lungs daily.     Off-Protocol Failed - 01/18/2024  8:49 AM      Failed - Medication not assigned to a protocol, review manually.      Passed - Valid encounter within last 12 months    Recent Outpatient Visits           1 week ago    [facility] Gareth Mliss FALCON, FNP   4 months ago Mixed incontinence urge and stress   [facility] Leavy Mole, PA-C   4 months ago Left-sided low back pain with left-sided sciatica, unspecified chronicity   [facility] Leavy Mole, PA-C   5 months ago Muscle cramps   [facility] Leavy Mole, PA-C   6 months ago Candidal intertrigo   [facility] Leavy Mole, PA-C       Future Appointments             In 1 week Marilynne, Rosaline SAILOR, MD [facility] Urogynecology at [facility], [facility]

## 2024-01-20 MED ORDER — INCRUSE ELLIPTA 62.5 MCG/ACT IN AEPB: 1.0000 | INHALATION_SPRAY | Freq: Every day | RESPIRATORY_TRACT | 0 refills | Status: DC

## 2024-01-20 MED ORDER — ALBUTEROL SULFATE HFA 108 (90 BASE) MCG/ACT IN AERS: 2.0000 | INHALATION_SPRAY | Freq: Four times a day (QID) | RESPIRATORY_TRACT | 0 refills | Status: AC | PRN

## 2024-01-21 ENCOUNTER — Telehealth: Payer: Self-pay

## 2024-01-21 ENCOUNTER — Inpatient Hospital Stay: Admitting: Nurse Practitioner

## 2024-01-21 NOTE — Progress Notes (Signed)
 Nicole Austin                                          MRN: [id number]   01/21/2024   The [facility] Quality Team Specialist reviewed this patient medical record for the purposes of chart review for care gap closure. The following were reviewed: abstraction for care gap closure-glycemic status assessment.    [facility] Quality Team

## 2024-01-21 NOTE — Telephone Encounter (Signed)
 VO given.

## 2024-01-21 NOTE — Telephone Encounter (Signed)
 Copied from CRM #[id number]. Topic: Clinical - Home Health Verbal Orders >> Jan 21, 2024  3:11 PM Rosaria BRAVO wrote: Caller/Agency: [facility] from [place] Number: [phone] Service Requested: Physical Therapy Frequency:   1w4 1 every 4 weeks -4 1w1  Any new concerns about the patient? Yes.  Pt was hospitalized for CHF from 11/14-11/17.

## 2024-01-22 ENCOUNTER — Encounter: Payer: Self-pay | Admitting: Nurse Practitioner

## 2024-01-22 ENCOUNTER — Telehealth: Payer: Self-pay

## 2024-01-22 ENCOUNTER — Ambulatory Visit: Admitting: Nurse Practitioner

## 2024-01-22 VITALS — BP 138/84 | HR 84 | Temp 97.6°F | Ht 61.0 in | Wt 238.0 lb

## 2024-01-22 DIAGNOSIS — R7303 Prediabetes: Secondary | ICD-10-CM

## 2024-01-22 DIAGNOSIS — J441 Chronic obstructive pulmonary disease with (acute) exacerbation: Secondary | ICD-10-CM

## 2024-01-22 DIAGNOSIS — Z09 Encounter for follow-up examination after completed treatment for conditions other than malignant neoplasm: Secondary | ICD-10-CM | POA: Diagnosis not present

## 2024-01-22 DIAGNOSIS — I509 Heart failure, unspecified: Secondary | ICD-10-CM

## 2024-01-22 DIAGNOSIS — E66813 Obesity, class 3: Secondary | ICD-10-CM

## 2024-01-22 DIAGNOSIS — E871 Hypo-osmolality and hyponatremia: Secondary | ICD-10-CM

## 2024-01-22 DIAGNOSIS — E785 Hyperlipidemia, unspecified: Secondary | ICD-10-CM

## 2024-01-22 LAB — OSMOLALITY: Osmolality: 264 mosm/kg — ABNORMAL LOW (ref 275–295)

## 2024-01-22 MED ORDER — TORSEMIDE 20 MG PO TABS: 20.0000 mg | ORAL_TABLET | Freq: Every day | ORAL | 1 refills | Status: DC

## 2024-01-22 NOTE — Telephone Encounter (Signed)
 VO given.

## 2024-01-22 NOTE — Progress Notes (Signed)
 BP 138/84   Pulse 84   Temp 97.6 F (36.4 C)   Ht 5' 1 (1.549 m)   Wt 238 lb (108 kg)   LMP  (LMP Unknown)   SpO2 (!) 89%   BMI 44.97 kg/m    Subjective:    Patient ID: Nicole Austin, female    DOB: [birth year], 63 y.o.   MRN: [id number]  HPI: Nicole Austin is a 63 y.o. female  Chief Complaint  Patient presents with   Medical Management of Chronic Issues    Pt had questions in regards to glp1. States she did not tolerate wegovy  very well.    Discussed the use of AI scribe software for clinical note transcription with the patient, who gave verbal consent to proceed.  History of Present Illness Nicole Austin is a 63 year old female with acute diastolic heart failure and COPD exacerbation who presents for a hospital follow-up.  Dyspnea and oxygen  requirement - Hospitalized from November 14 to January 13, 2024, for acute diastolic heart failure and shortness of breath - Diagnosed with COPD exacerbation during hospitalization - Received ciliated Medrol  for wheezing, with improvement to baseline respiratory status - Currently using home oxygen  at 2 liters, with improvement in breathing - No current use of water  pill due to prescription sent to wrong pharmacy  Peripheral edema and diuresis - Leg swelling present - Frequent urination reported - Torsemide  20 mg daily was started during hospitalization, but not currently taking due to pharmacy issue  Hyponatremia and electrolyte abnormalities - Chronic hyponatremia diagnosed during hospitalization - Laboratory findings: sodium 123-127, chloride 85-88, potassium 3.4 - BNP increased from 347 to 636 during hospital stay  Imaging and cardiac function - Chest x-ray on January 08, 2024, showed minimally hazy density over lateral left base, possibly due to overlying soft tissue, atelectasis, or early infection - Follow-up chest x-ray performed on January 10, 2024 - Echocardiogram on January 11, 2024, revealed  ejection fraction of 60-65%  Gastrointestinal symptoms and medication intolerance - Significant nausea and vomiting with Ozempic , discontinued after second dose  Physical deconditioning and rehabilitation - Participating in physical therapy and walking with rollator to build strength  Nutritional status and weight concerns - Receiving Meals on Wheels, described as 'heart healthy foods' - Concerned about weight, currently 238 lbs    Admit date:     01/10/2024  Discharge date: 01/13/24  Discharge Physician: Amaryllis Dare    PCP: Leavy Mole, PA-C    Recommendations at discharge:  Please obtain CBC and BMP on follow-up Patient is being started on torsemide  20 mg daily-Will need dose titration. Need to monitor sodium-seems like having chronic hyponatremia. Follow-up with primary care provider   Discharge Diagnoses: Principal Problem:   Acute diastolic (congestive) heart failure (HCC) Active Problems:   Shortness of breath   Hospital Course: Nicole Austin is a 63 y.o. female with medical history significant for COPD on as needed 2 L [place], bipolar 1 disorder being admitted to the hospital with several days of bilateral lower extremity edema, cough and dyspnea on exertion suspicious for combined acute exacerbation of COPD as well as new onset heart failure.  History is provided by the patient, says over the last few weeks she has had progressive significant swelling in her feet and legs, without pain.  She has no significant cardiac history, is not on diuretics.  In that same timeframe, she has developed a nonproductive wet sounding cough, and has been wheezing.  She denies any  chest pain, fevers, chills, nausea, vomiting or any other concerns.  She was admitted to the hospital 2 days ago, but had to leave AMA to make arrangements for her pets and is now back with the same complaints.    On presentation patient was requiring more oxygen  than her baseline.  Admitted for acute or  chronic hypoxic respiratory secondary to COPD and CHF exacerbation.  Echocardiogram with normal EF and grade 1 diastolic dysfunction.   Patient was treated with IV Lasix , renal function remained stable, recommendations were to continue IV diuresis but patient requested discharge so she was transition to p.o. torsemide  and will need a close follow-up with her physicians for further assistance as does need titration.  We had a lengthy discussion about her stay but she continued to refuse and requesting discharge.   She was also given Solu-Medrol  while in the hospital and is being discharged on 3 more days of prednisone .  She will continue using her inhalers at home.  Wheezing was much improved and she was on baseline oxygen  use of 2 L before discharge.   Patient also has some acute on chronic hypotonic hyponatremia, sodium pretty much stable at 127.  She did receive some salt tablets and IV Lasix .  She should continue with free water  restriction and her PCP can follow-up.  Might have some element of SIADH with her advanced COPD.   Patient was also found to have hypomagnesemia which was repleted and she will continue with her p.o. magnesium  supplement.   Patient recently treated for left lower extremity cellulitis, currently seems improved.  She can complete her course of antibiotic.   Patient was counseled for smoking cessation and need continuation of counseling by PCP.   She will continue on current medications and need to have a close follow-up with her providers for further assistance.   STOP taking these medications     diclofenac  75 MG EC tablet Commonly known as: VOLTAREN     lurasidone  20 MG Tabs tablet Commonly known as: LATUDA     oxyCODONE  5 MG immediate release tablet Commonly known as: Oxy IR/ROXICODONE            TAKE these medications     albuterol  108 (90 Base) MCG/ACT inhaler Commonly known as: VENTOLIN  HFA INHALE TWO PUFFS INTO THE LUNGS EVERY SIX  HOURS AS NEEDED FOR  WHEEZING OR SHORTNESS OF BREATH.    ALPRAZolam  0.5 MG tablet Commonly known as: XANAX  Take 1 tablet (0.5 mg total) by mouth 2 (two) times daily.    cyanocobalamin  1000 MCG tablet Commonly known as: VITAMIN B12 Take 1 tablet (1,000 mcg total) by mouth daily.    divalproex  250 MG DR tablet Commonly known as: DEPAKOTE  Take 1 tablet (250 mg total) by mouth 2 (two) times daily.    escitalopram  20 MG tablet Commonly known as: LEXAPRO  Take 20 mg by mouth daily.    Incruse Ellipta  62.5 MCG/ACT Aepb Generic drug: umeclidinium bromide  Inhale 1 puff into the lungs daily.    Iron  (Ferrous Sulfate ) 325 (65 Fe) MG Tabs Take 325 mg by mouth every other day.    magnesium  oxide 400 (240 Mg) MG tablet Commonly known as: MAG-OX TAKE ONE TABLET (400 MG TOTAL) BY MOUTH DAILY.    miconazole  2 % powder Commonly known as: MICOTIN Apply topically 2 (two) times daily as needed for itching (Candidal intertrigo).    mometasone -formoterol  100-5 MCG/ACT Aero Commonly known as: DULERA  Inhale 2 puffs into the lungs 2 (two) times daily.  multivitamin with minerals tablet Take 1 tablet by mouth daily.    naproxen  500 MG tablet Commonly known as: Naprosyn  Take 1 tablet (500 mg total) by mouth 2 (two) times daily with a meal.    nystatin  powder Commonly known as: MYCOSTATIN /NYSTOP  Apply 1 Application topically 3 (three) times daily as needed (yeast/fungal rashes or intertrigo).    predniSONE  50 MG tablet Commonly known as: DELTASONE  Take 1 tablet (50 mg total) by mouth daily with breakfast for 3 days.    primidone  50 MG tablet Commonly known as: MYSOLINE  Take 50 mg by mouth in the morning and at bedtime.    promethazine  25 MG tablet Commonly known as: PHENERGAN  Take 25 mg by mouth every 6 (six) hours as needed for nausea or vomiting.    psyllium 58.6 % powder Commonly known as: METAMUCIL Take 1 packet by mouth as needed.    rOPINIRole  4 MG 24 hr tablet Commonly known as: REQUIP   XL Take 4 mg by mouth at bedtime.    sulfamethoxazole -trimethoprim  800-160 MG tablet Commonly known as: BACTRIM  DS Take 1 tablet by mouth 2 (two) times daily.    tiZANidine  2 MG tablet Commonly known as: ZANAFLEX  TAKE ONE (1) TO TWO (2) TABLETS (2-4 MG TOTAL) BY MOUTH EVERY EIGHT HOURS AS NEEDED FOR MUSCLE SPASMS.    torsemide  20 MG tablet Commonly known as: DEMADEX  Take 1 tablet (20 mg total) by mouth daily.    EXAM:01/10/2024 2 VIEW(S) XRAY OF THE CHEST 01/10/2024 12:45:48 PM   COMPARISON: 2 days ago.   CLINICAL HISTORY: shortness of breath   FINDINGS:   LUNGS AND PLEURA: No focal pulmonary opacity. No pleural effusion. No pneumothorax.   HEART AND MEDIASTINUM: No acute abnormality of the cardiac and mediastinal silhouettes.   BONES AND SOFT TISSUES: No acute osseous abnormality.   IMPRESSION: 1. No acute cardiopulmonary process.   EXAM: 01/08/2024 PORTABLE CHEST 1 VIEW   COMPARISON:  02/08/2023   FINDINGS: Lungs are adequately inflated with minimal hazy density over the lateral left base possibly due to overlying soft tissues although atelectasis or early infection is possible. Cardiomediastinal silhouette and remainder of the exam is unchanged.   IMPRESSION: Minimal hazy density over the lateral left base which may be due to overlying soft tissues, although atelectasis or early infection is possible. Consider PA and lateral chest radiograph for better evaluation.  FINDINGS   Left Ventricle: Left ventricular ejection fraction, by estimation, is 60  to 65%. The left ventricle has normal function. The left ventricle has no  regional wall motion abnormalities. The average left ventricular global  longitudinal strain is 19.7 %.  Strain was performed and the global longitudinal strain is normal. The  left ventricular internal cavity size was normal in size. There is no left  ventricular hypertrophy. Left ventricular diastolic parameters are  consistent  with Grade I diastolic  dysfunction (impaired relaxation).      01/22/2024    1:23 PM 01/22/2024    1:22 PM 11/15/2023   10:30 AM  Depression screen PHQ 2/9  Decreased Interest 0 0 2  Down, Depressed, Hopeless 0 0 1  PHQ - 2 Score 0 0 3  Altered sleeping 0    Tired, decreased energy 0    Change in appetite 0    Feeling bad or failure about yourself  0    Trouble concentrating 0    Moving slowly or fidgety/restless 0    Suicidal thoughts 0    PHQ-9 Score 0  Relevant past medical, surgical, family and social history reviewed and updated as indicated. Interim medical history since our last visit reviewed. Allergies and medications reviewed and updated.  Review of Systems  Ten systems reviewed and is negative except as mentioned in HPI      Objective:      BP 138/84   Pulse 84   Temp 97.6 F (36.4 C)   Ht 5' 1 (1.549 m)   Wt 238 lb (108 kg)   LMP  (LMP Unknown)   SpO2 (!) 89%   BMI 44.97 kg/m    Wt Readings from Last 3 Encounters:  01/22/24 238 lb (108 kg)  01/08/24 235 lb (106.6 kg)  01/01/24 230 lb (104.3 kg)    Physical Exam MEASUREMENTS: Weight- 238. GENERAL: Alert, cooperative, well developed, no acute distress HEENT: Normocephalic, normal oropharynx, moist mucous membranes CHEST: basilar rales to auscultation bilaterally, no wheezes, rhonchi, or crackles CARDIOVASCULAR: Normal heart rate and rhythm, S1 and S2 normal without murmurs ABDOMEN: Soft, non-tender, non-distended, without organomegaly, normal bowel sounds EXTREMITIES: 1+ pitting edema in legs, no cyanosis NEUROLOGICAL: Cranial nerves grossly intact, moves all extremities without gross motor or sensory deficit  Results for orders placed or performed during the hospital encounter of 01/10/24  Basic metabolic panel with GFR   Collection Time: 01/10/24  1:21 PM  Result Value Ref Range   Sodium 126 (L) 135 - 145 mmol/L   Potassium 3.6 3.5 - 5.1 mmol/L   Chloride 88 (L) 98 - 111 mmol/L   CO2  27 22 - 32 mmol/L   Glucose, Bld 86 70 - 99 mg/dL   BUN 8 8 - 23 mg/dL   Creatinine, Ser 9.11 0.44 - 1.00 mg/dL   Calcium  9.4 8.9 - 10.3 mg/dL   GFR, Estimated >39 >39 mL/min   Anion gap 11 5 - 15  CBC   Collection Time: 01/10/24  1:21 PM  Result Value Ref Range   WBC 6.7 4.0 - 10.5 K/uL   RBC 4.54 3.87 - 5.11 MIL/uL   Hemoglobin 13.2 12.0 - 15.0 g/dL   HCT 59.7 63.9 - 53.9 %   MCV 88.5 80.0 - 100.0 fL   MCH 29.1 26.0 - 34.0 pg   MCHC 32.8 30.0 - 36.0 g/dL   RDW 86.1 88.4 - 84.4 %   Platelets 242 150 - 400 K/uL   nRBC 0.0 0.0 - 0.2 %  Pro Brain natriuretic peptide   Collection Time: 01/10/24  1:21 PM  Result Value Ref Range   Pro Brain Natriuretic Peptide 636.0 (H) <300.0 pg/mL  Troponin T, High Sensitivity   Collection Time: 01/10/24  1:21 PM  Result Value Ref Range   Troponin T High Sensitivity <15 0 - 19 ng/L  Troponin T, High Sensitivity   Collection Time: 01/10/24  3:37 PM  Result Value Ref Range   Troponin T High Sensitivity <15 0 - 19 ng/L  Basic metabolic panel   Collection Time: 01/11/24  3:44 AM  Result Value Ref Range   Sodium 127 (L) 135 - 145 mmol/L   Potassium 4.7 3.5 - 5.1 mmol/L   Chloride 88 (L) 98 - 111 mmol/L   CO2 29 22 - 32 mmol/L   Glucose, Bld 118 (H) 70 - 99 mg/dL   BUN 10 8 - 23 mg/dL   Creatinine, Ser 9.13 0.44 - 1.00 mg/dL   Calcium  9.3 8.9 - 10.3 mg/dL   GFR, Estimated >39 >39 mL/min   Anion gap 10 5 - 15  CBC   Collection Time: 01/11/24  3:44 AM  Result Value Ref Range   WBC 6.9 4.0 - 10.5 K/uL   RBC 4.57 3.87 - 5.11 MIL/uL   Hemoglobin 13.2 12.0 - 15.0 g/dL   HCT 60.8 63.9 - 53.9 %   MCV 85.6 80.0 - 100.0 fL   MCH 28.9 26.0 - 34.0 pg   MCHC 33.8 30.0 - 36.0 g/dL   RDW 85.9 88.4 - 84.4 %   Platelets 260 150 - 400 K/uL   nRBC 0.0 0.0 - 0.2 %  Miscellaneous [facility] test (send-out)   Collection Time: 01/11/24  3:44 AM  Result Value Ref Range   [facility] test code [id number]    [facility] test name [id number]    Source ([facility]) SERUM     Misc [facility] result COMMENT   Osmolality   Collection Time: 01/11/24  3:44 AM  Result Value Ref Range   Osmolality 264 (L) 275 - 295 mOsm/kg  Magnesium    Collection Time: 01/11/24  3:44 AM  Result Value Ref Range   Magnesium  1.6 (L) 1.7 - 2.4 mg/dL  Phosphorus   Collection Time: 01/11/24  3:44 AM  Result Value Ref Range   Phosphorus 3.7 2.5 - 4.6 mg/dL  ECHOCARDIOGRAM COMPLETE   Collection Time: 01/11/24 10:18 AM  Result Value Ref Range   BP 136/71 mmHg   Ao pk vel 1.18 m/s   AR max vel 2.30 cm2   AV Peak grad 5.6 mmHg   S' Lateral 3.30 cm   Area-P 1/2 3.54 cm2   Est EF 60 - 65%   CBC   Collection Time: 01/12/24  4:29 AM  Result Value Ref Range   WBC 9.4 4.0 - 10.5 K/uL   RBC 4.31 3.87 - 5.11 MIL/uL   Hemoglobin 12.6 12.0 - 15.0 g/dL   HCT 62.7 63.9 - 53.9 %   MCV 86.3 80.0 - 100.0 fL   MCH 29.2 26.0 - 34.0 pg   MCHC 33.9 30.0 - 36.0 g/dL   RDW 85.9 88.4 - 84.4 %   Platelets 263 150 - 400 K/uL   nRBC 0.0 0.0 - 0.2 %  Basic metabolic panel with GFR   Collection Time: 01/12/24  9:49 AM  Result Value Ref Range   Sodium 127 (L) 135 - 145 mmol/L   Potassium 3.6 3.5 - 5.1 mmol/L   Chloride 86 (L) 98 - 111 mmol/L   CO2 30 22 - 32 mmol/L   Glucose, Bld 98 70 - 99 mg/dL   BUN 13 8 - 23 mg/dL   Creatinine, Ser 9.25 0.44 - 1.00 mg/dL   Calcium  9.1 8.9 - 10.3 mg/dL   GFR, Estimated >39 >39 mL/min   Anion gap 11 5 - 15  Magnesium    Collection Time: 01/12/24  9:49 AM  Result Value Ref Range   Magnesium  1.6 (L) 1.7 - 2.4 mg/dL  Phosphorus   Collection Time: 01/12/24  9:49 AM  Result Value Ref Range   Phosphorus 3.4 2.5 - 4.6 mg/dL  Respiratory (~79 pathogens) panel by PCR   Collection Time: 01/12/24 12:35 PM   Specimen: Nasopharyngeal Swab; Respiratory  Result Value Ref Range   Adenovirus NOT DETECTED NOT DETECTED   Coronavirus 229E NOT DETECTED NOT DETECTED   Coronavirus HKU1 NOT DETECTED NOT DETECTED   Coronavirus NL63 NOT DETECTED NOT DETECTED   Coronavirus OC43  NOT DETECTED NOT DETECTED   Metapneumovirus NOT DETECTED NOT DETECTED   Rhinovirus / Enterovirus NOT DETECTED NOT DETECTED   Influenza  A NOT DETECTED NOT DETECTED   Influenza B NOT DETECTED NOT DETECTED   Parainfluenza Virus 1 NOT DETECTED NOT DETECTED   Parainfluenza Virus 2 NOT DETECTED NOT DETECTED   Parainfluenza Virus 3 NOT DETECTED NOT DETECTED   Parainfluenza Virus 4 NOT DETECTED NOT DETECTED   Respiratory Syncytial Virus NOT DETECTED NOT DETECTED   Bordetella pertussis NOT DETECTED NOT DETECTED   Bordetella Parapertussis NOT DETECTED NOT DETECTED   Chlamydophila pneumoniae NOT DETECTED NOT DETECTED   Mycoplasma pneumoniae NOT DETECTED NOT DETECTED  Resp panel by RT-PCR (RSV, Flu A&B, Covid) Anterior Nasal Swab   Collection Time: 01/12/24 12:35 PM   Specimen: Anterior Nasal Swab  Result Value Ref Range   SARS Coronavirus 2 by RT PCR NEGATIVE NEGATIVE   Influenza A by PCR NEGATIVE NEGATIVE   Influenza B by PCR NEGATIVE NEGATIVE   Resp Syncytial Virus by PCR NEGATIVE NEGATIVE  Glucose, capillary   Collection Time: 01/13/24  1:42 AM  Result Value Ref Range   Glucose-Capillary 107 (H) 70 - 99 mg/dL  Basic metabolic panel   Collection Time: 01/13/24  9:40 AM  Result Value Ref Range   Sodium 127 (L) 135 - 145 mmol/L   Potassium 3.4 (L) 3.5 - 5.1 mmol/L   Chloride 85 (L) 98 - 111 mmol/L   CO2 33 (H) 22 - 32 mmol/L   Glucose, Bld 98 70 - 99 mg/dL   BUN 15 8 - 23 mg/dL   Creatinine, Ser 9.24 0.44 - 1.00 mg/dL   Calcium  8.9 8.9 - 10.3 mg/dL   GFR, Estimated >39 >39 mL/min   Anion gap 9 5 - 15  Magnesium    Collection Time: 01/13/24  9:40 AM  Result Value Ref Range   Magnesium  1.9 1.7 - 2.4 mg/dL   *Note: Due to a large number of results and/or encounters for the requested time period, some results have not been displayed. A complete set of results can be found in Results Review.          Assessment & Plan:   Problem List Items Addressed This Visit       Respiratory    COPD exacerbation (HCC)   Relevant Orders   Ambulatory referral to Pulmonology     Other   HLD (hyperlipidemia)   Relevant Medications   torsemide  (DEMADEX ) 20 MG tablet   Other Relevant Orders   Lipid panel   Prediabetes   Relevant Orders   Hemoglobin A1c   Obesity, Class III, BMI 40-49.9 (morbid obesity) (HCC)   Relevant Orders   Referral to Nutrition and Diabetes Services   Other Visit Diagnoses       Acute on chronic heart failure, unspecified heart failure type (HCC)    -  Primary   Relevant Medications   torsemide  (DEMADEX ) 20 MG tablet   Other Relevant Orders   CBC with Differential/Platelet   Comprehensive metabolic panel with GFR   B Nat Peptide   Ambulatory referral to Cardiology     Hospital discharge follow-up       Relevant Orders   CBC with Differential/Platelet   Comprehensive metabolic panel with GFR     Hyponatremia       Relevant Orders   Comprehensive metabolic panel with GFR        Assessment and Plan Assessment & Plan Hospital Follow Up Follow-up after recent hospitalization for acute on chronic diastolic heart failure and COPD exacerbation. Discharged on November 17th, 2025. No acute cardiopulmonary process on chest x-ray. Labs  showed hyponatremia and elevated BNP. Echocardiogram showed EF 60-65%. - Ordered CBC and BMP - Referred to cardiology for follow-up - Referred to pulmonology for follow-up - Referred to nutritionist for dietary counseling  Acute on Chronic Diastolic Heart Failure with Hyponatremia Acute on chronic diastolic heart failure with hyponatremia. Hospitalized with elevated BNP and hyponatremia. Currently on torsemide  20 mg daily. Reports improvement in symptoms but still has fluid in lungs. No current diuretic prescription at home due to pharmacy error. - Resent torsemide  prescription to [facility] pharmacy - Ordered CBC and BMP to monitor electrolytes and BNP  Chronic Obstructive Pulmonary Disease with Acute  Exacerbation COPD exacerbation during recent hospitalization. Received Medrol  doses for wheezing, which improved. Baseline oxygen  requirement is 2 liters. No current pulmonologist follow-up. - Referred to pulmonology for follow-up  Class 3 Obesity Class 3 obesity with recent weight of 238 lbs. Previously tried Ozempic  and Wegovy  but experienced significant nausea and vomiting, leading to discontinuation. Engaged in physical therapy and using a rollator for mobility. Interested in non-pharmacological weight management strategies. - Referred to nutritionist for dietary counseling - Encouraged continuation of physical therapy and mobility exercises  Prediabetes Recent A1c check planned. Cholesterol levels were normal in June. - Ordered A1c with other blood work        Follow up plan: Return in about 6 months (around 07/21/2024) for follow up.

## 2024-01-22 NOTE — Telephone Encounter (Signed)
 Copied from CRM #[id number]. Topic: Clinical - Home Health Verbal Orders >> Jan 22, 2024  9:56 AM Alfonso ORN wrote: Caller/Agency: Mardeen with [facility] Callback Number: [phone] Service Requested: Occupational Therapy Frequency: one week two for OT  Any new concerns about the patient? No

## 2024-01-23 LAB — CBC WITH DIFFERENTIAL/PLATELET
Absolute Lymphocytes: 2088 {cells}/uL (ref 850–3900)
Absolute Monocytes: 1062 {cells}/uL — ABNORMAL HIGH (ref 200–950)
Basophils Absolute: 63 {cells}/uL (ref 0–200)
Basophils Relative: 0.7 %
Eosinophils Absolute: 900 {cells}/uL — ABNORMAL HIGH (ref 15–500)
Eosinophils Relative: 10 %
HCT: 37.7 % (ref 35.9–46.0)
Hemoglobin: 12.2 g/dL (ref 11.7–15.5)
MCH: 28.7 pg (ref 27.0–33.0)
MCHC: 32.4 g/dL (ref 31.6–35.4)
MCV: 88.7 fL (ref 81.4–101.7)
MPV: 10.6 fL (ref 7.5–12.5)
Monocytes Relative: 11.8 %
Neutro Abs: 4887 {cells}/uL (ref 1500–7800)
Neutrophils Relative %: 54.3 %
Platelets: 278 Thousand/uL (ref 140–400)
RBC: 4.25 Million/uL (ref 3.80–5.10)
RDW: 13.5 % (ref 11.0–15.0)
Total Lymphocyte: 23.2 %
WBC: 9 Thousand/uL (ref 3.8–10.8)

## 2024-01-23 LAB — LIPID PANEL
Cholesterol: 142 mg/dL (ref <200)
HDL: 56 mg/dL (ref >=50)
LDL Cholesterol (Calc): 70 mg/dL
Non-HDL Cholesterol (Calc): 86 mg/dL (ref <130)
Total CHOL/HDL Ratio: 2.5 (calc) (ref <5.0)
Triglycerides: 82 mg/dL (ref <150)

## 2024-01-23 LAB — COMPREHENSIVE METABOLIC PANEL WITH GFR
AG Ratio: 1.5 (calc) (ref 1.0–2.5)
ALT: 9 U/L (ref 6–29)
AST: 12 U/L (ref 10–35)
Albumin: 3.5 g/dL — ABNORMAL LOW (ref 3.6–5.1)
Alkaline phosphatase (APISO): 77 U/L (ref 37–153)
BUN/Creatinine Ratio: 9 (calc) (ref 6–22)
BUN: 17 mg/dL (ref 7–25)
CO2: 39 mmol/L — ABNORMAL HIGH (ref 20–32)
Calcium: 9 mg/dL (ref 8.6–10.4)
Chloride: 81 mmol/L — ABNORMAL LOW (ref 98–110)
Creat: 1.96 mg/dL — ABNORMAL HIGH (ref 0.50–1.05)
Globulin: 2.3 g/dL (ref 1.9–3.7)
Glucose, Bld: 90 mg/dL (ref 65–99)
Potassium: 3.4 mmol/L — ABNORMAL LOW (ref 3.5–5.3)
Sodium: 127 mmol/L — ABNORMAL LOW (ref 135–146)
Total Bilirubin: 0.3 mg/dL (ref 0.2–1.2)
Total Protein: 5.8 g/dL — ABNORMAL LOW (ref 6.1–8.1)
eGFR: 28 mL/min/1.73m2 — ABNORMAL LOW (ref >=60)

## 2024-01-23 LAB — HEMOGLOBIN A1C
Hgb A1c MFr Bld: 5.7 % — ABNORMAL HIGH (ref <5.7)
Mean Plasma Glucose: 117 mg/dL
eAG (mmol/L): 6.5 mmol/L

## 2024-01-23 LAB — BRAIN NATRIURETIC PEPTIDE: Brain Natriuretic Peptide: 22 pg/mL (ref <100)

## 2024-01-27 ENCOUNTER — Other Ambulatory Visit: Payer: Self-pay | Admitting: Nurse Practitioner

## 2024-01-27 ENCOUNTER — Ambulatory Visit: Payer: Self-pay | Admitting: Nurse Practitioner

## 2024-01-27 DIAGNOSIS — M5442 Lumbago with sciatica, left side: Secondary | ICD-10-CM

## 2024-01-27 DIAGNOSIS — E871 Hypo-osmolality and hyponatremia: Secondary | ICD-10-CM

## 2024-01-27 DIAGNOSIS — N184 Chronic kidney disease, stage 4 (severe): Secondary | ICD-10-CM

## 2024-01-28 ENCOUNTER — Telehealth: Payer: Self-pay

## 2024-01-28 NOTE — Telephone Encounter (Signed)
 Copied from CRM #[id number]. Topic: General - Other >> Jan 28, 2024 12:59 PM Avram MATSU wrote: Reason for CRM: patient is going out of town starting 12/6-27th and Annabella would like a verbal order to [facility]. Please advise [phone]

## 2024-01-28 NOTE — Telephone Encounter (Signed)
 Lvm w/VO

## 2024-01-29 ENCOUNTER — Ambulatory Visit: Payer: Self-pay

## 2024-01-29 NOTE — Telephone Encounter (Signed)
 FYI Only or Action Required?: FYI only for provider: appointment scheduled on 01/31/24. Pt unable to come in sooner due to transportation.   Patient was last seen in primary care on 01/22/2024 by Gareth Mliss FALCON, FNP.  Called Nurse Triage reporting Depression and Anxiety.  Symptoms began several days ago.  Interventions attempted: Nothing.  Symptoms are: gradually worsening.  Triage Disposition: See Physician Within 24 Hours  Patient/caregiver understands and will follow disposition?: Yes  Reason for Disposition  [1] Depression AND [2] getting worse (e.g., sleeping poorly, less able to do activities of daily living)  Answer Assessment - Initial Assessment Questions Pt unable to come in for appt tomorrow d/t transportation, appt scheduled for 01/31/24; Pt inquiring about more information regarding her labs/kidneys; Pt also states that she was told to schedule a visit for follow up labs, but unable to schedule d/t no order   1. CONCERN: What happened that made you call today?     Patient states she has been been feeling nervous, anxious, and depressed. Started feeling this way a couple days ago.   2. DEPRESSION SYMPTOM SCREENING: How are you feeling overall? (e.g., decreased energy, increased sleeping or difficulty sleeping, difficulty concentrating, feelings of sadness, guilt, hopelessness, or worthlessness)     Patient states that she isn't sleeping, has decreased appetite, and doesn't feel like doing anything  3. RISK OF HARM - SUICIDAL IDEATION:  Do you ever have thoughts of hurting or killing yourself?  (e.g., yes, no, no but preoccupation with thoughts about death)     No  4. RISK OF HARM - HOMICIDAL IDEATION:  Do you ever have thoughts of hurting or killing someone else?  (e.g., yes, no, no but preoccupation with thoughts about death)     No  5. FUNCTIONAL IMPAIRMENT: How have things been going for you overall? Have you had more difficulty than usual doing your normal  daily activities?  (e.g., better, same, worse; self-care, school, work, interactions)     Patient states that some days she isn't able to perform her daily activities; states the last 2 days she has not, but today she was able to get up, shower, and prepare for home PT  6. SUPPORT: Who is with you now? Who do you live with? Do you have family or friends who you can talk to?      Best friend is with patient at her home   7. THERAPIST: Do you have a counselor or therapist? If Yes, ask: What is their name?     Yes, Therapist-Jennifer Hambright; Psychiatrist-Dr. Conny Su  8. STRESSORS: Has there been any new stress or recent changes in your life?     Pt states she is supposed to visit her son in [facility] which triggered feelings of being nervous, scared, and anxious b/c she doesn't like to leave her comfort zone; She then received a call stating her kidneys were failing followed by a call that she needed to see a cardiologist and states these are making her feel this way  9. ALCOHOL USE OR SUBSTANCE USE (DRUG USE): Do you drink alcohol or use any illegal drugs?     No alcohol, Uses marijuana, but has not today   10. OTHER: Do you have any other physical symptoms right now? (e.g., fever)       Not related to depression/anxiety   11. PREGNANCY: Is there any chance you are pregnant? When was your last menstrual period?       NA  Protocols used:  Depression-A-AH   Copied from CRM [id number]. Topic: Clinical - Red Word Triage >> Jan 29, 2024  1:33 PM Victoria B wrote: [facility] that prompted transfer to Nurse Triage: Patient states not feelin well and depressed

## 2024-01-30 NOTE — Telephone Encounter (Signed)
 Requested medications are due for refill today.  yes  Requested medications are on the active medications list.  yes  Last refill. 10/25/2023 #30 1 rf  Future visit scheduled.   yes  Notes to clinic.  Refill not delegated.    Requested Prescriptions  Pending Prescriptions Disp Refills   tiZANidine  (ZANAFLEX ) 2 MG tablet [Pharmacy Med Name: TIZANIDINE  HYDROCHLORIDE 2MG  TABLET] 30 tablet 1    Sig: TAKE ONE (1) TO TWO (2) TABLETS (2-4 MG TOTAL) BY MOUTH EVERY EIGHT HOURS AS NEEDED FOR MUSCLE SPASMS.     Not Delegated - Cardiovascular:  Alpha-2 Agonists - tizanidine  Failed - 01/30/2024  2:51 PM      Failed - This refill cannot be delegated      Passed - Valid encounter within last 6 months    Recent Outpatient Visits           1 week ago Acute on chronic heart failure, unspecified heart failure type [facility])   [facility] Gareth Mliss FALCON, FNP   3 weeks ago    [facility] Gareth Mliss FALCON, FNP   4 months ago Mixed incontinence urge and stress   [facility] Leavy Mole, PA-C   5 months ago Left-sided low back pain with left-sided sciatica, unspecified chronicity   [facility] Leavy Mole, PA-C   5 months ago Muscle cramps   [facility] Leavy Mole, [place]       Future Appointments             Tomorrow Marilynne, Rosaline SAILOR, MD [facility]   In 1 month Gollan, Timothy J, MD [facility]

## 2024-01-31 ENCOUNTER — Ambulatory Visit: Payer: Self-pay

## 2024-01-31 ENCOUNTER — Other Ambulatory Visit: Payer: Self-pay | Admitting: Nurse Practitioner

## 2024-01-31 ENCOUNTER — Ambulatory Visit: Admitting: Obstetrics and Gynecology

## 2024-01-31 ENCOUNTER — Telehealth: Payer: Self-pay | Admitting: Nurse Practitioner

## 2024-01-31 ENCOUNTER — Ambulatory Visit: Admitting: Nurse Practitioner

## 2024-01-31 DIAGNOSIS — E538 Deficiency of other specified B group vitamins: Secondary | ICD-10-CM

## 2024-01-31 DIAGNOSIS — L304 Erythema intertrigo: Secondary | ICD-10-CM

## 2024-01-31 MED ORDER — FLUCONAZOLE 150 MG PO TABS: ORAL_TABLET | ORAL | 0 refills | Status: DC

## 2024-01-31 MED ORDER — NYSTATIN 100000 UNIT/GM EX POWD: 1.0000 | Freq: Three times a day (TID) | CUTANEOUS | 2 refills | Status: DC | PRN

## 2024-01-31 NOTE — Telephone Encounter (Signed)
 Nystatin  already refilled on 01/31/24 in a separate refill encounter. Cyanocobalamin  already requested on 01/31/24 by pharmacy in separate refill encounter.

## 2024-01-31 NOTE — Telephone Encounter (Signed)
 FYI Only or Action Required?: Action required by provider: medication refill request and update on patient condition.  Patient was last seen in primary care on 01/22/2024 by Gareth Mliss FALCON, FNP.  Called Nurse Triage reporting Medication Refill and Rash.  Symptoms began x 1 year.  Interventions attempted: Prescription medications: nystatin .  Symptoms are: gradually worsening x 2 weeks.  Triage Disposition: Call PCP Now (overriding See PCP Within 2 Weeks)  Patient/caregiver understands and will follow disposition?: Yes               Copied from CRM #[id number]. Topic: Clinical - Red Word Triage >> Jan 31, 2024 10:35 AM Tiffini S wrote: [facility] that prompted transfer to Nurse Triage: Patient states that she have gain- over wet in vaginal area- very painful and raw/ hurts Reason for Disposition  Pain in genital area is a chronic symptom (recurrent or ongoing AND present > 4 weeks)  Answer Assessment - Initial Assessment Questions Patient calling in and states she needs a refill of her nystatin  powder, ran out 2 days ago. She has had the panus/skin fold rash and moisture for over a year but recently worsening. She states Michelene Cower used to prescribe a pill for her that helped with the symptoms but unsure what the name was. She states it has worsened to the poin that she has to sit with her legs wide open to dry it out, unable to sleep with sheets on her at night, unable to wear clothes that touch the area and sitting on an incontinence pad for the rash. RN offered appointments with PCP and she states she can't come in today due to the weather and she does not have a ride either.   1. SYMPTOM: What's the main symptom you're concerned about? (e.g., rash, itching, swelling, dryness)     Moisture, foul odor, raw red rash with fluid under skin folds in genital/groin area.  2. LOCATION: Where is the  rash located? (e.g., inside/outside, left/right)     Groin/vaginal and under skin  folds/panus.  3. ONSET: When did the  symptoms  start?     Over a year and worsening x 2 weeks.  4. PAIN: Is there any pain? If Yes, ask: How bad is it? (Scale: 0-10; none, mild, moderate, severe)     Yes; severe.  5. CAUSE: What do you think is causing the symptoms?     Intertrigo from being overweight with skin folds. She states at night time, the covers cause moisture and sweating/build up and she will wake up soaked.  6. OTHER SYMPTOMS: Do you have any other symptoms? (e.g., fever, vaginal bleeding, pain with urination)     No fever, bleeding, pain with urination.  Protocols used: Vulvar Symptoms-A-AH

## 2024-01-31 NOTE — Telephone Encounter (Signed)
 Copied from CRM [id number]. Topic: Clinical - Medication Refill >> Jan 31, 2024 10:30 AM Tiffini S wrote: Medication:  nystatin  (MYCOSTATIN /NYSTOP ) powder   cyanocobalamin  (VITAMIN B12) 1000 MCG tablet    Has the patient contacted their pharmacy? Yes (Agent: If no, request that the patient contact the pharmacy for the refill. If patient does not wish to contact the pharmacy document the reason why and proceed with request.) (Agent: If yes, when and what did the pharmacy advise?)  This is the patient's preferred pharmacy:  [facility] [place], [place] - [address] [place] [address] Phone: [phone] Fax: [phone]   Is this the correct pharmacy for this prescription? Yes If no, delete pharmacy and type the correct one.   Has the prescription been filled recently? Yes  Is the patient out of the medication? Yes  Has the patient been seen for an appointment in the last year OR does the patient have an upcoming appointment? Yes  Can we respond through MyChart? No, please call the patient by phone at [phone]  Agent: Please be advised that Rx refills may take up to 3 business days. We ask that you follow-up with your pharmacy.

## 2024-02-03 NOTE — Telephone Encounter (Signed)
 Requested medication (s) are due for refill today - no  Requested medication (s) are on the active medication list -yes  Future visit scheduled -no  Last refill: 01/31/24 60g 2RF  Notes to clinic: duplicate request, off protocol- provider review   Requested Prescriptions  Pending Prescriptions Disp Refills   NYSTATIN  powder [Pharmacy Med Name: NYSTOP  100000 POWDER] 60 g 2    Sig: APPLY 1 APPLICATION TOPICALLY 3 TIMES DAILY AS NEEDED (YEAST/FUNGAL RASHES OR INTERTRIGO).     Off-Protocol Failed - 02/03/2024  3:38 PM      Failed - Medication not assigned to a protocol, review manually.      Passed - Valid encounter within last 12 months    Recent Outpatient Visits           1 week ago Acute on chronic heart failure, unspecified heart failure type [facility])   [facility] Gareth Mliss FALCON, FNP   4 weeks ago    [facility] Gareth Mliss FALCON, FNP   5 months ago Mixed incontinence urge and stress   [facility] Leavy Mole, [facility]-C   5 months ago Left-sided low back pain with left-sided sciatica, unspecified chronicity   [facility] Leavy Mole, PA-C   6 months ago Muscle cramps   [facility] Leavy Mole, PA-C       Future Appointments             In 4 weeks Perla, Timothy J, MD [place] [facility]            Signed Prescriptions Disp Refills   cyanocobalamin  (VITAMIN B12) 1000 MCG tablet 90 tablet 1    Sig: TAKE 1 TABLET (1,000 MCG TOTAL) BY MOUTH DAILY.     Endocrinology:  Vitamins - Vitamin B12 Passed - 02/03/2024  3:38 PM      Passed - HCT in normal range and within 360 days    HCT  Date Value Ref Range Status  01/22/2024 37.7 35.9 - 46.0 % Final  06/15/2014 32.9 (L) 35.0 - 47.0 % Final         Passed - HGB in normal range and within 360 days    Hemoglobin  Date Value Ref Range Status  01/22/2024 12.2 11.7 - 15.5 g/dL  Final   HGB  Date Value Ref Range Status  06/15/2014 10.9 (L) 12.0 - 16.0 g/dL Final         Passed - B12 Level in normal range and within 360 days    Vitamin B-12  Date Value Ref Range Status  08/06/2023 233 200 - 1,100 pg/mL Final    Comment:    . Please Note: Although the reference range for vitamin B12 is [phone] pg/mL, it has been reported that between 5 and 10% of patients with values between 200 and 400 pg/mL may experience neuropsychiatric and hematologic abnormalities due to occult B12 deficiency; less than 1% of patients with values above 400 pg/mL will have symptoms. SABRA Amy - Valid encounter within last 12 months    Recent Outpatient Visits           1 week ago Acute on chronic heart failure, unspecified heart failure type [facility])   [facility] Gareth Mliss FALCON, FNP   4 weeks ago    [facility] G[facility] Mliss FALCON, FNP   5  months ago Mixed incontinence urge and stress   [facility] Leavy Mole, PA-C   5 months ago Left-sided low back pain with left-sided sciatica, unspecified chronicity   [facility] Leavy Mole, PA-C   6 months ago Muscle cramps   [facility] Leavy Mole, [place]       Future Appointments             In 4 weeks Perla, Timothy J, MD [place] [facility]               Requested Prescriptions  Pending Prescriptions Disp Refills   NYSTATIN  powder [Pharmacy Med Name: NYSTOP  100000 POWDER] 60 g 2    Sig: APPLY 1 APPLICATION TOPICALLY 3 TIMES DAILY AS NEEDED (YEAST/FUNGAL RASHES OR INTERTRIGO).     Off-Protocol Failed - 02/03/2024  3:38 PM      Failed - Medication not assigned to a protocol, review manually.      Passed - Valid encounter within last 12 months    Recent Outpatient Visits           1 week ago Acute on chronic heart failure, unspecified heart failure type [facility])    [facility] Gareth Mliss FALCON, FNP   4 weeks ago    [facility] Gareth Mliss FALCON, FNP   5 months ago Mixed incontinence urge and stress   [facility] Leavy Mole, PA-C   5 months ago Left-sided low back pain with left-sided sciatica, unspecified chronicity   [facility] Leavy Mole, PA-C   6 months ago Muscle cramps   [facility] Leavy Mole, PA-C       Future Appointments             In 4 weeks Perla, Timothy J, MD [place] [facility] at [facility]            Signed Prescriptions Disp Refills   cyanocobalamin  (VITAMIN B12) 1000 MCG tablet 90 tablet 1    Sig: TAKE 1 TABLET (1,000 MCG TOTAL) BY MOUTH DAILY.     Endocrinology:  Vitamins - Vitamin B12 Passed - 02/03/2024  3:38 PM      Passed - HCT in normal range and within 360 days    HCT  Date Value Ref Range Status  01/22/2024 37.7 35.9 - 46.0 % Final  06/15/2014 32.9 (L) 35.0 - 47.0 % Final         Passed - HGB in normal range and within 360 days    Hemoglobin  Date Value Ref Range Status  01/22/2024 12.2 11.7 - 15.5 g/dL Final   HGB  Date Value Ref Range Status  06/15/2014 10.9 (L) 12.0 - 16.0 g/dL Final         Passed - B12 Level in normal range and within 360 days    Vitamin B-12  Date Value Ref Range Status  08/06/2023 233 200 - 1,100 pg/mL Final    Comment:    . Please Note: Although the reference range for vitamin B12 is [phone] pg/mL, it has been reported that between 5 and 10% of patients with values between 200 and 400 pg/mL may experience neuropsychiatric and hematologic abnormalities due to occult B12 deficiency; less than 1% of patients with values above 400 pg/mL will have symptoms. SABRA Amy - Valid encounter within last 12 months  Recent Outpatient Visits           1 week ago Acute on chronic heart failure, unspecified heart failure type  [facility])   [place] Gareth Mliss FALCON, FNP   4 weeks ago    [facility] Gareth Mliss FALCON, FNP   5 months ago Mixed incontinence urge and stress   [facility] Leavy Mole, PA-C   5 months ago Left-sided low back pain with left-sided sciatica, unspecified chronicity   [facility] Leavy Mole, PA-C   6 months ago Muscle cramps   [facility] Leavy Mole, PA-C       Future Appointments             In 4 weeks Gollan, Timothy J, MD [facility] at [facility]

## 2024-02-03 NOTE — Telephone Encounter (Signed)
 Requested Prescriptions  Pending Prescriptions Disp Refills   NYSTATIN  powder [Pharmacy Med Name: NYSTOP  100000 POWDER] 60 g 2    Sig: APPLY 1 APPLICATION TOPICALLY 3 TIMES DAILY AS NEEDED (YEAST/FUNGAL RASHES OR INTERTRIGO).     Off-Protocol Failed - 02/03/2024  3:37 PM      Failed - Medication not assigned to a protocol, review manually.      Passed - Valid encounter within last 12 months    Recent Outpatient Visits           1 week ago Acute on chronic heart failure, unspecified heart failure type [facility])   [facility] Gareth Mliss FALCON, FNP   4 weeks ago    [facility] Gareth Mliss FALCON, FNP   5 months ago Mixed incontinence urge and stress   [facility] Leavy Mole, PA-C   5 months ago Left-sided low back pain with left-sided sciatica, unspecified chronicity   [facility] Leavy Mole, PA-C   6 months ago Muscle cramps   [facility] Leavy Mole, [place]       Future Appointments             In 4 weeks Perla, Timothy J, MD [place] [facility] at [facility]             cyanocobalamin  (VITAMIN B12) 1000 MCG tablet [Pharmacy Med Name: VITAMIN B-12 1000MCG TABLET] 90 tablet 1    Sig: TAKE 1 TABLET (1,000 MCG TOTAL) BY MOUTH DAILY.     Endocrinology:  Vitamins - Vitamin B12 Passed - 02/03/2024  3:37 PM      Passed - HCT in normal range and within 360 days    HCT  Date Value Ref Range Status  01/22/2024 37.7 35.9 - 46.0 % Final  06/15/2014 32.9 (L) 35.0 - 47.0 % Final         Passed - HGB in normal range and within 360 days    Hemoglobin  Date Value Ref Range Status  01/22/2024 12.2 11.7 - 15.5 g/dL Final   HGB  Date Value Ref Range Status  06/15/2014 10.9 (L) 12.0 - 16.0 g/dL Final         Passed - B12 Level in normal range and within 360 days    Vitamin B-12  Date Value Ref Range Status  08/06/2023 233 200 - 1,100 pg/mL Final     Comment:    . Please Note: Although the reference range for vitamin B12 is [phone] pg/mL, it has been reported that between 5 and 10% of patients with values between 200 and 400 pg/mL may experience neuropsychiatric and hematologic abnormalities due to occult B12 deficiency; less than 1% of patients with values above 400 pg/mL will have symptoms. SABRA Amy - Valid encounter within last 12 months    Recent Outpatient Visits           1 week ago Acute on chronic heart failure, unspecified heart failure type [facility])   [facility] Gareth Mliss FALCON, FNP   4 weeks ago    [facility] Gareth Mliss FALCON, FNP   5 months ago Mixed incontinence urge and stress   [facility] Leavy Mole, PA-C   5 months ago Left-sided low back pain with left-sided sciatica, unspecified chronicity   [facility] Leavy Mole, PA-C  6 months ago Muscle cramps   [facility] Leavy Mole, [place]       Future Appointments             In 4 weeks Gollan, Timothy J, MD [facility] at [facility]

## 2024-02-04 ENCOUNTER — Telehealth

## 2024-02-04 ENCOUNTER — Other Ambulatory Visit (HOSPITAL_COMMUNITY): Payer: Self-pay

## 2024-02-04 NOTE — Progress Notes (Signed)
 Nicole Austin                                          MRN: [id number]   02/04/2024   The [facility] Quality Team Specialist reviewed this patient medical record for the purposes of chart review for care gap closure. The following were reviewed: chart review for care gap closure-kidney health evaluation for diabetes:eGFR  and uACR.    [facility]BCI Quality Team

## 2024-02-05 ENCOUNTER — Other Ambulatory Visit (HOSPITAL_COMMUNITY): Payer: Self-pay

## 2024-02-11 NOTE — Discharge Summary (Signed)
 Physician Discharge Summary   Patient: Nicole Austin MRN: [id number] DOB: Oct 02, 1960  Admit date:     01/08/2024  Discharge date: 01/08/2024  Discharge Physician: Cort ONEIDA Mana   PCP: Gareth Mliss FALCON, FNP   Recommendations at discharge:   PCP in 2 weeks  Discharge Diagnoses: Principal Problem:   CHF (congestive heart failure) (HCC) Active Problems:   Acute on chronic diastolic CHF (congestive heart failure) (HCC)  Resolved Problems:   * No resolved hospital problems. *  Hospital Course:  63 year old patient with history of COPD chronic hypoxic respiratory failure, OSA on CPAP presented with increasing ankle swelling.  Initially she came to ED 2 weeks ago, when DVT study was done showed no DVT and patient was diagnosed with cellulitis and discharged home on Keflex .  Despite taking 7 days worth of antibiotics, her symptoms of ankle swelling and pain have become worse.  Meantime she also developed cough and wheezing and shortness of breath and worsening of baseline dyspnea and came to ED.  ED studies showed negative for pneumonia, sodium 123 potassium 4.9 hemoglobin 12.5 WBC 6.5.  Patient was diagnosed with acute on chronic HFpEF decompensation and plan was for her to stay in the hospital for IV diuresis.  Patient however left AMA.  Patient was read about possible consequences of worsening of CHF decompensation and hypoxia despite left AMA.     Pain control - [place]  Controlled Substance Reporting System database was reviewed. and patient was instructed, not to drive, operate heavy machinery, perform activities at heights, swimming or participation in water  activities or provide baby-sitting services while on Pain, Sleep and Anxiety Medications; until their outpatient Physician has advised to do so again. Also recommended to not to take more than prescribed Pain, Sleep and Anxiety Medications.  Consultants: None Procedures performed: None Disposition: Home Diet  recommendation:  Cardiac diet DISCHARGE MEDICATION: Allergies as of 01/08/2024       Reactions   Chantix  [varenicline ] Nausea Only   Glycopyrrolate  Rash   Oral irritation        Medication List     ASK your doctor about these medications    ALPRAZolam  0.5 MG tablet Commonly known as: XANAX  Take 1 tablet (0.5 mg total) by mouth 2 (two) times daily.   cephALEXin  500 MG capsule Commonly known as: KEFLEX  Take 1 capsule (500 mg total) by mouth 3 (three) times daily for 7 days. Ask about: Should I take this medication?   divalproex  250 MG DR tablet Commonly known as: DEPAKOTE  Take 1 tablet (250 mg total) by mouth 2 (two) times daily.   escitalopram  20 MG tablet Commonly known as: LEXAPRO  Take 20 mg by mouth daily.   Iron  (Ferrous Sulfate ) 325 (65 Fe) MG Tabs Take 325 mg by mouth every other day.   magnesium  oxide 400 (240 Mg) MG tablet Commonly known as: MAG-OX TAKE ONE TABLET (400 MG TOTAL) BY MOUTH DAILY.   miconazole  2 % powder Commonly known as: MICOTIN Apply topically 2 (two) times daily as needed for itching (Candidal intertrigo).   mometasone -formoterol  100-5 MCG/ACT Aero Commonly known as: DULERA  Inhale 2 puffs into the lungs 2 (two) times daily.   multivitamin with minerals tablet Take 1 tablet by mouth daily.   naproxen  500 MG tablet Commonly known as: Naprosyn  Take 1 tablet (500 mg total) by mouth 2 (two) times daily with a meal.   primidone  50 MG tablet Commonly known as: MYSOLINE  Take 50 mg by mouth in the morning and at bedtime.  promethazine  25 MG tablet Commonly known as: PHENERGAN  Take 25 mg by mouth every 6 (six) hours as needed for nausea or vomiting.   psyllium 58.6 % powder Commonly known as: METAMUCIL Take 1 packet by mouth as needed.   rOPINIRole  4 MG 24 hr tablet Commonly known as: REQUIP  XL Take 4 mg by mouth at bedtime. The timing of this medication is very important.   sulfamethoxazole -trimethoprim  800-160 MG  tablet Commonly known as: BACTRIM  DS Take 1 tablet by mouth 2 (two) times daily.        Discharge Exam: Filed Weights   01/08/24 1031  Weight: 106.6 kg   Eyes: PERRL, lids and conjunctivae normal ENMT: Mucous membranes are moist. Posterior pharynx clear of any exudate or lesions.Normal dentition.  Neck: normal, supple, no masses, no thyromegaly Respiratory: clear to auscultation bilaterally, no wheezing, no crackles. Normal respiratory effort. No accessory muscle use.  Cardiovascular: Regular rate and rhythm, no murmurs / rubs / gallops. 2+ extremity edema. 2+ pedal pulses. No carotid bruits.  Abdomen: no tenderness, no masses palpated. No hepatosplenomegaly. Bowel sounds positive.  Musculoskeletal: no clubbing / cyanosis. No joint deformity upper and lower extremities. Good ROM, no contractures. Normal muscle tone.  Skin: no rashes, lesions, ulcers. No induration Neurologic: CN 2-12 grossly intact. Sensation intact, DTR normal.  Muscle strength 5/5 on both sides Psychiatric: Normal judgment and insight. Alert and oriented x 3. Normal mood.     Condition at discharge: fair  The results of significant diagnostics from this hospitalization (including imaging, microbiology, ancillary and laboratory) are listed below for reference.   Imaging Studies: No results found.  Microbiology: Results for orders placed or performed during the hospital encounter of 02/08/23  SARS Coronavirus 2 by RT PCR (hospital order, performed in [facility] hospital lab) *cepheid single result test* Anterior Nasal Swab     Status: Abnormal   Collection Time: 02/10/23  8:24 PM   Specimen: Anterior Nasal Swab  Result Value Ref Range Status   SARS Coronavirus 2 by RT PCR POSITIVE (A) NEGATIVE Final    Comment: (NOTE) SARS-CoV-2 target nucleic acids are DETECTED  SARS-CoV-2 RNA is generally detectable in upper respiratory specimens  during the acute phase of infection.  Positive results are indicative  of  the presence of the identified virus, but do not rule out bacterial infection or co-infection with other pathogens not detected by the test.  Clinical correlation with patient history and  other diagnostic information is necessary to determine patient infection status.  The expected result is negative.  Fact Sheet for Patients:   roadlaptop.co.za   Fact Sheet for Healthcare Providers:   http://kim-miller.com/    This test is not yet approved or cleared by the [place]  FDA and  has been authorized for detection and/or diagnosis of SARS-CoV-2 by FDA under an Emergency Use Authorization (EUA).  This EUA will remain in effect (meaning this test can be used) for the duration of  the COVID-19 declaration under Section 564(b)(1)  of the Act, 21 U.S.C. section 360-bbb-3(b)(1), unless the authorization is terminated or revoked sooner.   Performed at [facility], [address] [place], [place] [address]    *Note: Due to a large number of results and/or encounters for the requested time period, some results have not been displayed. A complete set of results can be found in Results Review.    Labs: CBC: No results for input(s): WBC, NEUTROABS, HGB, HCT, MCV, PLT in the last 168 hours. Basic Metabolic Panel:  No results for input(s): NA, K, CL, CO2, GLUCOSE, BUN, CREATININE, CALCIUM , MG, PHOS in the last 168 hours. Liver Function Tests: No results for input(s): AST, ALT, ALKPHOS, BILITOT, PROT, ALBUMIN in the last 168 hours. CBG: No results for input(s): GLUCAP in the last 168 hours.  Discharge time spent: less than 30 minutes.  Signed: Cort ONEIDA Mana, MD [facility] 02/11/2024

## 2024-02-13 ENCOUNTER — Other Ambulatory Visit: Payer: Self-pay | Admitting: Nurse Practitioner

## 2024-02-17 NOTE — Telephone Encounter (Signed)
 Requested Prescriptions  Pending Prescriptions Disp Refills   SPIRIVA  RESPIMAT 2.5 MCG/ACT AERS [Pharmacy Med Name: SPIRIVA  RESPIMAT 2.5MCG AEROSOL SOLN] 4 g 0    Sig: INHALE 2 PUFFS INTO THE LUNGS DAILY     Pulmonology:  Anticholinergic Agents Passed - 02/17/2024  2:30 PM      Passed - Valid encounter within last 12 months    Recent Outpatient Visits           3 weeks ago Acute on chronic heart failure, unspecified heart failure type [facility])    [facility] Gareth Mliss FALCON, FNP   1 month ago    [facility] Gareth Mliss FALCON, FNP   5 months ago Mixed incontinence urge and stress   [facility] Leavy Mole, PA-C   5 months ago Left-sided low back pain with left-sided sciatica, unspecified chronicity   [facility] Leavy Mole, PA-C   6 months ago Muscle cramps   [facility] Leavy Mole, [place]       Future Appointments             In 2 weeks Gollan, Timothy J, MD [facility] at [facility]

## 2024-02-18 ENCOUNTER — Telehealth: Payer: Self-pay

## 2024-02-18 ENCOUNTER — Other Ambulatory Visit: Payer: Self-pay | Admitting: Nurse Practitioner

## 2024-02-18 DIAGNOSIS — L304 Erythema intertrigo: Secondary | ICD-10-CM

## 2024-02-18 NOTE — Telephone Encounter (Signed)
 Copied from CRM #[id number]. Topic: Clinical - Medication Question >> Feb 18, 2024  1:52 PM Joesph B wrote: Reason for CRM: [facility] nurse mercy is calling to report patients medication because a warning came up in her system when she was adding the meds in.  Ph: [phone]  fluconazole  interacts with lexapro  fluconazole  interacts primidone  (MYSOLINE ) 50 MG tablet fluconazole  interacts with xanax 

## 2024-02-19 NOTE — Telephone Encounter (Signed)
 Requested medication (s) are due for refill today: yes  Requested medication (s) are on the active medication list: yes  Last refill:  01/31/24  Future visit scheduled: yes  Notes to clinic:  Medication not assigned to a protocol, review manually.      Requested Prescriptions  Pending Prescriptions Disp Refills   fluconazole  (DIFLUCAN ) 150 MG tablet [Pharmacy Med Name: FLUCONAZOLE  150MG  TABLET] 20 tablet 0    Sig: TAKE 1 TABLET (150 MG TOTAL) BY MOUTH DAILY FOR SEVEN DAY THEN TAKE EVERY 3 DAYS IF SYMPTOMS PERSIST (RASH/YEAST INFECTION)     Off-Protocol Failed - 02/19/2024  3:03 PM      Failed - Medication not assigned to a protocol, review manually.      Passed - Valid encounter within last 12 months    Recent Outpatient Visits           4 weeks ago Acute on chronic heart failure, unspecified heart failure type [facility])   [facility] Gareth Mliss FALCON, FNP   1 month ago    [facility] Gareth Mliss FALCON, FNP   5 months ago Mixed incontinence urge and stress   [facility] Leavy Mole, PA-C   5 months ago Left-sided low back pain with left-sided sciatica, unspecified chronicity   [facility] Leavy Mole, PA-C   6 months ago Muscle cramps   [facility] Leavy Mole, PA-C       Future Appointments             In 1 week Gollan, Timothy J, MD [facility] at [facility]

## 2024-02-24 ENCOUNTER — Other Ambulatory Visit: Payer: Self-pay

## 2024-02-24 NOTE — Patient Outreach (Signed)
 Complex Care Management   Visit Note  02/24/2024  Name:  Nicole Austin MRN: [id number] DOB: 31-Mar-1960  Situation: Referral received for Complex Care Management related to COPD I obtained verbal consent from Patient.  Visit completed with Patient  on the phone  Background:   Past Medical History:  Diagnosis Date   ADHD (attention deficit hyperactivity disorder)    Apnea, sleep 06/30/2015   Arthritis    Asthma    Asthma with acute exacerbation 06/30/2015   Benign neoplasm of ascending colon    Bipolar 1 disorder (HCC)    Chronic pain    COPD, moderate (HCC) 11/18/2013   Depression    DOE (dyspnea on exertion)    Fall 07/04/2022   GERD (gastroesophageal reflux disease) 08/30/2015   Gout 08/16/2014   History of acute myocardial infarction 06/30/2015   History of panic attacks    HPV (human papilloma virus) infection 07/17/2013   Hyperlipidemia    Left knee pain 07/06/2022   Low HDL (under 40) 08/30/2015   MI (myocardial infarction) (HCC) 2018   MRSA infection 2023   groin abscess   Nose colonized with MRSA 03/21/2022   a.) PCR (+) prior to LEFT THA   Osteoporosis    Parkinson disease (HCC)    Polyp of sigmoid colon    Pre-diabetes    Prediabetes 05/20/2016   PTSD (post-traumatic stress disorder)    Restless leg syndrome    Rhabdomyolysis    Sleep apnea 06/30/2015   Stage 3 severe COPD by GOLD classification (HCC) 11/18/2013   Stroke (HCC) 2018   right arm weakness   Traumatic rhabdomyolysis 07/04/2022   Tremors of nervous system    hands   Uncomplicated asthma 06/30/2015   Varicella 02/25/2017    Assessment: Patient Reported Symptoms:  Cognitive Cognitive Status: Requires Assistance Decision Making (reluctant to go to ED because of special needs cat, explained importance of going to ED due to risk to health/life, spoke with aide, reports patient confused starting late yesterday)   Health Maintenance Behaviors: Annual physical exam  Neurological  Neurological Review of Symptoms: No symptoms reported Neurological Management Strategies: Routine screening  HEENT HEENT Symptoms Reported: Nasal discharge HEENT Management Strategies: Routine screening    Cardiovascular Cardiovascular Symptoms Reported: Chest pain or discomfort, Lightheadness, Fatigue Cardiovascular Management Strategies: Routine screening Cardiovascular Comment: Advised to go to ED this morning - has not weighed self, states extremely swollen legs and feet, for a few days, reports CP, thinks from coughing/resp illness, fatigue and lightheaded, aide Jan arrived, will call 911 for patient to go to ED as discussed  Respiratory Other Respiratory Symptoms: O2 2L reports sick x3-4 days, Advised to go to ED this morning . Sx: hoarse voice, nasal congestion, SOB with wheeze, tight cough nonproductive, reports inhaler 2-3x day, neb daily and taking all meds as ordered, no additional meds for sx, cold chills, dizzy/weak/lightheaded at times, poor appetite and forcing self to eat and drink, having diarrhea since last night and nauseated, HA and sore throat from cough. Reports aide is supposed to be here, declined for [facility] to do conference call with aide. Patient calling aide and will call RNCM back - patient agreed will try to arrange for Aide to care for cat so she can go to ED this morning. Patient called back and aide arrived - Aide Jan reported patient with some confusion starting late yeaterday, agreed to care for cat and will call 911 for patient to go to ED patient in agreement Respiratory Management  Strategies: Oxygen  therapy, Medication therapy  Endocrine Endocrine Symptoms Reported: Not assessed    Gastrointestinal Gastrointestinal Symptoms Reported: Diarrhea, Nausea Additional Gastrointestinal Details: diarrhea and nausea since last night- reports forcing self to eat and drink Gastrointestinal Management Strategies: Medication therapy    Genitourinary Genitourinary Symptoms  Reported: Incontinence Additional Genitourinary Details: Aide reports off a little confused since yesterday, thought possible UTI Genitourinary Management Strategies: Incontinence garment/pad  Integumentary      Musculoskeletal          Psychosocial            02/24/2024    PHQ2-9 Depression Screening   Little interest or pleasure in doing things    Feeling down, depressed, or hopeless    PHQ-2 - Total Score    Trouble falling or staying asleep, or sleeping too much    Feeling tired or having little energy    Poor appetite or overeating     Feeling bad about yourself - or that you are a failure or have let yourself or your family down    Trouble concentrating on things, such as reading the newspaper or watching television    Moving or speaking so slowly that other people could have noticed.  Or the opposite - being so fidgety or restless that you have been moving around a lot more than usual    Thoughts that you would be better off dead, or hurting yourself in some way    PHQ2-9 Total Score    If you checked off any problems, how difficult have these problems made it for you to do your work, take care of things at home, or get along with other people    Depression Interventions/Treatment Medication, Counseling, Currently on Treatment    There were no vitals filed for this visit.    Medications Reviewed Today   Medications were not reviewed in this encounter     Recommendation:   PCP Follow-up Acute PCP follow-up after ED visit. You were advised to call 911 and go to ED this morning, schedule follow up with PCP after   Follow Up Plan:   Telephone follow-up in 1 month  Nestora Duos, MSN, RN [facility]  [facility], [facility] RN Care Manager Direct Dial: [phone] Fax: [phone]

## 2024-02-24 NOTE — Patient Instructions (Signed)
 Visit Information  Thank you for taking time to visit with me today. Please don't hesitate to contact me if I can be of assistance to you before our next scheduled appointment.  Your next care management appointment is by telephone on 03/23/2024 at 9:99 am   Telephone follow-up in 1 month  Please call the care guide team at [phone] if you need to cancel, schedule, or reschedule an appointment.   Please call the Suicide and Crisis Lifeline: 988 call the [place]  National Suicide Prevention Lifeline: (781)413-7757 or TTY: 781-801-9525 TTY 843-354-2917) to talk to a trained counselor call 1-800-273-TALK (toll free, 24 hour hotline) go to [facility] [address] [place] [phone]) call 911 if you are experiencing a Mental Health or Behavioral Health Crisis or need someone to talk to.  Nestora Duos, MSN, RN [facility], [facility] RN Care Manager Direct Dial: [phone] Fax: [phone]

## 2024-02-25 ENCOUNTER — Emergency Department

## 2024-02-25 ENCOUNTER — Observation Stay
Admission: EM | Admit: 2024-02-25 | Discharge: 2024-02-27 | Disposition: A | Discharge status: Home / Self Care | Attending: Obstetrics and Gynecology | Admitting: Obstetrics and Gynecology

## 2024-02-25 ENCOUNTER — Other Ambulatory Visit: Payer: Self-pay

## 2024-02-25 DIAGNOSIS — F419 Anxiety disorder, unspecified: Secondary | ICD-10-CM | POA: Insufficient documentation

## 2024-02-25 DIAGNOSIS — I509 Heart failure, unspecified: Secondary | ICD-10-CM

## 2024-02-25 DIAGNOSIS — Z96652 Presence of left artificial knee joint: Secondary | ICD-10-CM | POA: Insufficient documentation

## 2024-02-25 DIAGNOSIS — F17210 Nicotine dependence, cigarettes, uncomplicated: Secondary | ICD-10-CM | POA: Insufficient documentation

## 2024-02-25 DIAGNOSIS — Z79899 Other long term (current) drug therapy: Secondary | ICD-10-CM | POA: Insufficient documentation

## 2024-02-25 DIAGNOSIS — J111 Influenza due to unidentified influenza virus with other respiratory manifestations: Secondary | ICD-10-CM

## 2024-02-25 DIAGNOSIS — E871 Hypo-osmolality and hyponatremia: Secondary | ICD-10-CM | POA: Insufficient documentation

## 2024-02-25 DIAGNOSIS — R9431 Abnormal electrocardiogram [ECG] [EKG]: Secondary | ICD-10-CM | POA: Insufficient documentation

## 2024-02-25 DIAGNOSIS — G4730 Sleep apnea, unspecified: Secondary | ICD-10-CM | POA: Insufficient documentation

## 2024-02-25 DIAGNOSIS — I110 Hypertensive heart disease with heart failure: Secondary | ICD-10-CM | POA: Diagnosis not present

## 2024-02-25 DIAGNOSIS — Z6841 Body Mass Index (BMI) 40.0 and over, adult: Secondary | ICD-10-CM | POA: Diagnosis not present

## 2024-02-25 DIAGNOSIS — I5032 Chronic diastolic (congestive) heart failure: Secondary | ICD-10-CM | POA: Diagnosis not present

## 2024-02-25 DIAGNOSIS — Z8673 Personal history of transient ischemic attack (TIA), and cerebral infarction without residual deficits: Secondary | ICD-10-CM | POA: Insufficient documentation

## 2024-02-25 DIAGNOSIS — E66813 Obesity, class 3: Secondary | ICD-10-CM | POA: Insufficient documentation

## 2024-02-25 DIAGNOSIS — G20C Parkinsonism, unspecified: Secondary | ICD-10-CM | POA: Diagnosis not present

## 2024-02-25 DIAGNOSIS — J441 Chronic obstructive pulmonary disease with (acute) exacerbation: Secondary | ICD-10-CM | POA: Diagnosis not present

## 2024-02-25 DIAGNOSIS — F319 Bipolar disorder, unspecified: Secondary | ICD-10-CM | POA: Insufficient documentation

## 2024-02-25 DIAGNOSIS — J449 Chronic obstructive pulmonary disease, unspecified: Secondary | ICD-10-CM | POA: Diagnosis present

## 2024-02-25 DIAGNOSIS — Z7901 Long term (current) use of anticoagulants: Secondary | ICD-10-CM | POA: Diagnosis not present

## 2024-02-25 DIAGNOSIS — L304 Erythema intertrigo: Secondary | ICD-10-CM

## 2024-02-25 DIAGNOSIS — J9621 Acute and chronic respiratory failure with hypoxia: Secondary | ICD-10-CM | POA: Diagnosis not present

## 2024-02-25 DIAGNOSIS — Z96642 Presence of left artificial hip joint: Secondary | ICD-10-CM | POA: Insufficient documentation

## 2024-02-25 DIAGNOSIS — J45909 Unspecified asthma, uncomplicated: Secondary | ICD-10-CM | POA: Diagnosis not present

## 2024-02-25 DIAGNOSIS — R0602 Shortness of breath: Secondary | ICD-10-CM | POA: Diagnosis present

## 2024-02-25 DIAGNOSIS — J101 Influenza due to other identified influenza virus with other respiratory manifestations: Secondary | ICD-10-CM | POA: Diagnosis not present

## 2024-02-25 LAB — CBC
HCT: 40.3 % (ref 36.0–46.0)
Hemoglobin: 13.5 g/dL (ref 12.0–15.0)
MCH: 29.5 pg (ref 26.0–34.0)
MCHC: 33.5 g/dL (ref 30.0–36.0)
MCV: 88 fL (ref 80.0–100.0)
Platelets: 192 K/uL (ref 150–400)
RBC: 4.58 MIL/uL (ref 3.87–5.11)
RDW: 15.7 % — ABNORMAL HIGH (ref 11.5–15.5)
WBC: 5.5 K/uL (ref 4.0–10.5)
nRBC: 0 % (ref 0.0–0.2)

## 2024-02-25 LAB — TROPONIN T, HIGH SENSITIVITY
Troponin T High Sensitivity: <15 ng/L (ref 0–19)
Troponin T High Sensitivity: <15 ng/L (ref 0–19)

## 2024-02-25 LAB — COMPREHENSIVE METABOLIC PANEL WITH GFR
ALT: 12 U/L (ref 0–44)
AST: 21 U/L (ref 15–41)
Albumin: 4 g/dL (ref 3.5–5.0)
Alkaline Phosphatase: 80 U/L (ref 38–126)
Anion gap: 12 (ref 5–15)
BUN: 7 mg/dL — ABNORMAL LOW (ref 8–23)
CO2: 28 mmol/L (ref 22–32)
Calcium: 9.3 mg/dL (ref 8.9–10.3)
Chloride: 90 mmol/L — ABNORMAL LOW (ref 98–111)
Creatinine, Ser: 0.84 mg/dL (ref 0.44–1.00)
GFR, Estimated: >60 mL/min
Glucose, Bld: 148 mg/dL — ABNORMAL HIGH (ref 70–99)
Potassium: 3.6 mmol/L (ref 3.5–5.1)
Sodium: 130 mmol/L — ABNORMAL LOW (ref 135–145)
Total Bilirubin: 0.3 mg/dL (ref 0.0–1.2)
Total Protein: 7.1 g/dL (ref 6.5–8.1)

## 2024-02-25 LAB — RESP PANEL BY RT-PCR (RSV, FLU A&B, COVID)  RVPGX2
Influenza A by PCR: POSITIVE — AB
Influenza B by PCR: NEGATIVE
Resp Syncytial Virus by PCR: NEGATIVE
SARS Coronavirus 2 by RT PCR: NEGATIVE

## 2024-02-25 LAB — PRO BRAIN NATRIURETIC PEPTIDE: Pro Brain Natriuretic Peptide: 207 pg/mL

## 2024-02-25 LAB — BLOOD GAS, VENOUS
Acid-Base Excess: 1.6 mmol/L (ref 0.0–2.0)
Bicarbonate: 29.5 mmol/L — ABNORMAL HIGH (ref 20.0–28.0)
O2 Saturation: 50.9 %
Patient temperature: 37
pCO2, Ven: 60 mmHg (ref 44–60)
pH, Ven: 7.3 (ref 7.25–7.43)
pO2, Ven: 35 mmHg (ref 32–45)

## 2024-02-25 MED ORDER — PREDNISONE 20 MG PO TABS
40.0000 mg | ORAL_TABLET | Freq: Every day | ORAL | Status: DC
  Administered 2024-02-27: 40 mg via ORAL
  Filled 2024-02-25: qty 2

## 2024-02-25 MED ORDER — FLUTICASONE FUROATE-VILANTEROL 100-25 MCG/ACT IN AEPB
1.0000 | INHALATION_SPRAY | Freq: Every day | RESPIRATORY_TRACT | Status: DC
  Administered 2024-02-26: 1 via RESPIRATORY_TRACT
  Filled 2024-02-25: qty 28

## 2024-02-25 MED ORDER — ACETAMINOPHEN 325 MG PO TABS
650.0000 mg | ORAL_TABLET | Freq: Four times a day (QID) | ORAL | Status: DC | PRN
  Administered 2024-02-25 – 2024-02-27 (×5): 650 mg via ORAL
  Filled 2024-02-25 (×5): qty 2

## 2024-02-25 MED ORDER — IPRATROPIUM-ALBUTEROL 0.5-2.5 (3) MG/3ML IN SOLN
3.0000 mL | RESPIRATORY_TRACT | Status: DC
  Administered 2024-02-25 – 2024-02-26 (×4): 3 mL via RESPIRATORY_TRACT
  Filled 2024-02-25 (×5): qty 3

## 2024-02-25 MED ORDER — ACETAMINOPHEN 325 MG PO TABS
650.0000 mg | ORAL_TABLET | Freq: Once | ORAL | Status: AC
  Administered 2024-02-25: 650 mg via ORAL
  Filled 2024-02-25: qty 2

## 2024-02-25 MED ORDER — TORSEMIDE 20 MG PO TABS
20.0000 mg | ORAL_TABLET | Freq: Every day | ORAL | Status: DC
  Administered 2024-02-25 – 2024-02-26 (×2): 20 mg via ORAL
  Filled 2024-02-25 (×2): qty 1

## 2024-02-25 MED ORDER — ROPINIROLE HCL ER 4 MG PO TB24
4.0000 mg | ORAL_TABLET | Freq: Every day | ORAL | Status: DC
  Administered 2024-02-25 – 2024-02-26 (×2): 4 mg via ORAL
  Filled 2024-02-25 (×4): qty 1

## 2024-02-25 MED ORDER — PRIMIDONE 50 MG PO TABS
50.0000 mg | ORAL_TABLET | Freq: Two times a day (BID) | ORAL | Status: DC
  Administered 2024-02-25 – 2024-02-27 (×5): 50 mg via ORAL
  Filled 2024-02-25 (×5): qty 1

## 2024-02-25 MED ORDER — ALPRAZOLAM 0.5 MG PO TABS
0.5000 mg | ORAL_TABLET | Freq: Four times a day (QID) | ORAL | Status: DC | PRN
  Administered 2024-02-25 – 2024-02-27 (×3): 0.5 mg via ORAL
  Filled 2024-02-25 (×3): qty 1

## 2024-02-25 MED ORDER — TIOTROPIUM BROMIDE 2.5 MCG/ACT IN AERS: 2.0000 | INHALATION_SPRAY | Freq: Every day | RESPIRATORY_TRACT | Status: DC

## 2024-02-25 MED ORDER — ALPRAZOLAM 0.5 MG PO TABS
0.5000 mg | ORAL_TABLET | Freq: Once | ORAL | Status: AC
  Administered 2024-02-25: 0.5 mg via ORAL
  Filled 2024-02-25: qty 1

## 2024-02-25 MED ORDER — ENOXAPARIN SODIUM 60 MG/0.6ML IJ SOSY
0.5000 mg/kg | PREFILLED_SYRINGE | INTRAMUSCULAR | Status: DC
  Administered 2024-02-25 – 2024-02-27 (×3): 55 mg via SUBCUTANEOUS
  Filled 2024-02-25 (×3): qty 0.6

## 2024-02-25 MED ORDER — DOXYCYCLINE HYCLATE 100 MG PO TABS
100.0000 mg | ORAL_TABLET | Freq: Two times a day (BID) | ORAL | Status: DC
  Administered 2024-02-25 – 2024-02-27 (×5): 100 mg via ORAL
  Filled 2024-02-25 (×5): qty 1

## 2024-02-25 MED ORDER — IPRATROPIUM-ALBUTEROL 0.5-2.5 (3) MG/3ML IN SOLN
3.0000 mL | Freq: Once | RESPIRATORY_TRACT | Status: AC
  Administered 2024-02-25: 3 mL via RESPIRATORY_TRACT
  Filled 2024-02-25: qty 3

## 2024-02-25 MED ORDER — OLANZAPINE 10 MG IM SOLR: 2.5000 mg | Freq: Four times a day (QID) | INTRAMUSCULAR | Status: DC | PRN

## 2024-02-25 MED ORDER — TIZANIDINE HCL 2 MG PO TABS
2.0000 mg | ORAL_TABLET | Freq: Three times a day (TID) | ORAL | Status: DC | PRN
  Administered 2024-02-25 – 2024-02-27 (×3): 2 mg via ORAL
  Filled 2024-02-25 (×5): qty 1

## 2024-02-25 MED ORDER — ALPRAZOLAM 0.5 MG PO TABS: 0.5000 mg | ORAL_TABLET | Freq: Two times a day (BID) | ORAL | Status: DC

## 2024-02-25 MED ORDER — ESCITALOPRAM OXALATE 10 MG PO TABS
20.0000 mg | ORAL_TABLET | Freq: Every day | ORAL | Status: DC
  Administered 2024-02-25 – 2024-02-27 (×3): 20 mg via ORAL
  Filled 2024-02-25 (×3): qty 2

## 2024-02-25 MED ORDER — METHYLPREDNISOLONE SODIUM SUCC 40 MG IJ SOLR
40.0000 mg | Freq: Two times a day (BID) | INTRAMUSCULAR | Status: AC
  Administered 2024-02-25 – 2024-02-26 (×2): 40 mg via INTRAVENOUS
  Filled 2024-02-25 (×2): qty 1

## 2024-02-25 MED ORDER — UMECLIDINIUM BROMIDE 62.5 MCG/ACT IN AEPB
1.0000 | INHALATION_SPRAY | Freq: Every day | RESPIRATORY_TRACT | Status: DC
  Administered 2024-02-25 – 2024-02-26 (×2): 1 via RESPIRATORY_TRACT
  Filled 2024-02-25: qty 7

## 2024-02-25 MED ORDER — ALBUTEROL SULFATE (2.5 MG/3ML) 0.083% IN NEBU
3.0000 mL | INHALATION_SOLUTION | Freq: Four times a day (QID) | RESPIRATORY_TRACT | Status: DC | PRN
  Administered 2024-02-25: 3 mL via RESPIRATORY_TRACT
  Filled 2024-02-25: qty 3

## 2024-02-25 MED ORDER — ACETAMINOPHEN 650 MG RE SUPP
650.0000 mg | Freq: Four times a day (QID) | RECTAL | Status: DC | PRN
  Filled 2024-02-25: qty 2

## 2024-02-25 MED ORDER — DIVALPROEX SODIUM 250 MG PO DR TAB
250.0000 mg | DELAYED_RELEASE_TABLET | Freq: Two times a day (BID) | ORAL | Status: DC
  Administered 2024-02-25 – 2024-02-27 (×5): 250 mg via ORAL
  Filled 2024-02-25 (×5): qty 1

## 2024-02-25 NOTE — Progress Notes (Signed)
 PHARMACIST - PHYSICIAN COMMUNICATION  CONCERNING:  Enoxaparin  (Lovenox ) for DVT Prophylaxis    RECOMMENDATION: Patient was prescribed enoxaprin 40mg  q24 hours for VTE prophylaxis.   Filed Weights   02/25/24 0958  Weight: 110.9 kg (244 lb 7.8 oz)    Body mass index is 46.2 kg/m.  Estimated Creatinine Clearance: 78 mL/min (by C-G formula based on SCr of 0.84 mg/dL).   Based on [facility] policy patient is candidate for enoxaparin  0.5mg /kg TBW SQ every 24 hours based on BMI being >30.  DESCRIPTION: Pharmacy has adjusted enoxaparin  dose per [facility] policy.  Patient is now receiving enoxaparin  55 mg every 24 hours    Damien Napoleon, PharmD Clinical Pharmacist  02/25/2024 1:21 PM

## 2024-02-25 NOTE — H&P (Signed)
 " History and Physical    Nicole Austin FMW:[id number] DOB: 1960/12/22 DOA: 02/25/2024  PCP: Nicole Mliss FALCON, FNP (Confirm with patient/family/NH records and if not entered, this has to be entered at [facility] point of entry) Patient coming from: Home  I have personally briefly reviewed patient's old medical records in [facility]  Chief Complaint: Austin, wheezing, shortness of breath  HPI: Nicole Austin is a 63 y.o. female with medical history significant of COPD Gold stage III, chronic hypoxic respiratory failure on 2 L as needed, HTN/chronic HFpEF, bipolar disorder, morbid obesity, presented with worsening of Austin wheezing shortness of breath.  Symptoms started 4-5 days ago open patient started to have sore throat runny nose with dry Austin density started to have wheezing and increasing exertional dyspnea.  Denied any chest pain no fever or chills.  Symptoms became worse yesterday, despite using around-the-clock breathing treatments she continued to experience worsening shortness of breath, she felt out of breath at rest this morning and came to ED.  She has not received flu shot this year. ED Course: Afebrile, tachycardia heart rate 106, tachypneic heart rate 106 blood pressure 114/62 O2 saturation 94% on 4 L.  Chest x-ray negative for acute findings, blood work showed VBG 7.3/60/35, WBC 5.5 hemoglobin 17.5 BUN 7 creatinine 0.8 bicarb 28 sodium 130 potassium 3.6.  Nasal swab positive for influenza A.  Patient was given IV Solu-Medrol , DuoNebs in the ED.  Review of Systems: As per HPI otherwise 14 point review of systems negative.    Past Medical History:  Diagnosis Date   ADHD (attention deficit hyperactivity disorder)    Apnea, sleep 06/30/2015   Arthritis    Asthma    Asthma with acute exacerbation 06/30/2015   Benign neoplasm of ascending colon    Bipolar 1 disorder (HCC)    Chronic pain    COPD, moderate (HCC) 11/18/2013   Depression    DOE (dyspnea on exertion)     Fall 07/04/2022   GERD (gastroesophageal reflux disease) 08/30/2015   Gout 08/16/2014   History of acute myocardial infarction 06/30/2015   History of panic attacks    HPV (human papilloma virus) infection 07/17/2013   Hyperlipidemia    Left knee pain 07/06/2022   Low HDL (under 40) 08/30/2015   MI (myocardial infarction) (HCC) 2018   MRSA infection 2023   groin abscess   Nose colonized with MRSA 03/21/2022   a.) PCR (+) prior to LEFT THA   Osteoporosis    Parkinson disease (HCC)    Polyp of sigmoid colon    Pre-diabetes    Prediabetes 05/20/2016   PTSD (post-traumatic stress disorder)    Restless leg syndrome    Rhabdomyolysis    Sleep apnea 06/30/2015   Stage 3 severe COPD by GOLD classification (HCC) 11/18/2013   Stroke (HCC) 2018   right arm weakness   Traumatic rhabdomyolysis 07/04/2022   Tremors of nervous system    hands   Uncomplicated asthma 06/30/2015   Varicella 02/25/2017    Past Surgical History:  Procedure Laterality Date   BACK SURGERY     lumbar   CATARACT EXTRACTION W/PHACO Right 01/01/2019   Procedure: CATARACT EXTRACTION PHACO AND INTRAOCULAR LENS PLACEMENT (IOC) right vision blue;  Surgeon: Nicole Rogue, MD;  Location: [facility] ORS;  Service: Ophthalmology;  Laterality: Right;  US  00:39.4 CDE 5.49 Fluid Pack lot # [id number]   CATARACT EXTRACTION W/PHACO Left 01/29/2019   Procedure: CATARACT EXTRACTION PHACO AND INTRAOCULAR LENS PLACEMENT (IOC) LEFT Vision  Blue;  Surgeon: Nicole Rogue, MD;  Location: [facility] ORS;  Service: Ophthalmology;  Laterality: Left;  US  00:51.1 CDE 4.38 Fluid Pack Lot # [id number]   COLONOSCOPY WITH PROPOFOL  N/A 08/07/2017   Procedure: COLONOSCOPY WITH PROPOFOL ;  Surgeon: Nicole Keene NOVAK, MD;  Location: [facility] ENDOSCOPY;  Service: Endoscopy;  Laterality: N/A;   HAND SURGERY Left    fractured with pins   REPLACEMENT TOTAL KNEE Left 2016   SPINAL CORD STIMULATOR INSERTION  2014   SPINAL CORD STIMULATOR REMOVAL  2014   TOTAL  HIP ARTHROPLASTY Left 10/09/2022   Procedure: TOTAL HIP ARTHROPLASTY ANTERIOR APPROACH;  Surgeon: Nicole Cough, MD;  Location: [facility] ORS;  Service: Orthopedics;  Laterality: Left;   TUBAL LIGATION       reports that she has been smoking cigarettes. She started smoking about 37 years ago. She has a 18 pack-year smoking history. She has been exposed to tobacco smoke. She has never used smokeless tobacco. She reports current drug use. Drug: Marijuana. She reports that she does not drink alcohol.  Allergies[1]  Family History  Problem Relation Age of Onset   Hernia Mother    Heart disease Mother    OCD Mother    Diabetes Mother    Parkinson's disease Father    Bipolar disorder Sister    Schizophrenia Sister    ADD / ADHD Sister    Alcohol abuse Brother    Bipolar disorder Sister    Paranoid behavior Sister    ADD / ADHD Sister    ADD / ADHD Son    Dementia Maternal Grandmother    Emphysema Maternal Grandfather    ADD / ADHD Son    ADD / ADHD Son    Depression Son      Prior to Admission medications  Medication Sig Start Date End Date Taking? Authorizing Provider  albuterol  (VENTOLIN  HFA) 108 (90 Base) MCG/ACT inhaler Inhale 2 puffs into the lungs every 6 (six) hours as needed for wheezing or shortness of breath. 01/20/24   Bernardo Fend, DO  ALPRAZolam  (XANAX ) 0.5 MG tablet Take 1 tablet (0.5 mg total) by mouth 2 (two) times daily. 07/10/22   Josette Ade, MD  cyanocobalamin  (VITAMIN B12) 1000 MCG tablet TAKE 1 TABLET (1,000 MCG TOTAL) BY MOUTH DAILY. 02/03/24   Nicole Mliss FALCON, FNP  divalproex  (DEPAKOTE ) 250 MG DR tablet Take 1 tablet (250 mg total) by mouth 2 (two) times daily. 11/19/16   Mohammed Goldstein, MD  escitalopram  (LEXAPRO ) 20 MG tablet Take 20 mg by mouth daily.    [provider]  fluconazole  (DIFLUCAN ) 150 MG tablet Take 1 tablet (150 mg total) by mouth daily. For 7 day then do every 3 days if symptoms persist (rash/yeast infection) 01/31/24   Nicole Mliss FALCON,  FNP  ipratropium-albuterol  (DUONEB) 0.5-2.5 (3) MG/3ML SOLN TAKE 3 MLS BY NEBULIZATION 3 TIMES DAILY AS NEEDED 01/17/24   Pender, Julie F, FNP  Iron , Ferrous Sulfate , 325 (65 Fe) MG TABS Take 325 mg by mouth every other day. 08/07/23   Tapia, [name], PA-C  magnesium  oxide (MAG-OX) 400 (240 Mg) MG tablet TAKE ONE TABLET (400 MG TOTAL) BY MOUTH DAILY. 10/17/23   Tapia, [name], PA-C  miconazole  (MICOTIN) 2 % powder Apply topically 2 (two) times daily as needed for itching (Candidal intertrigo). 04/22/23   Tapia, [name], PA-C  mometasone -formoterol  (DULERA ) 100-5 MCG/ACT AERO Inhale 2 puffs into the lungs 2 (two) times daily. 02/11/23   [place], Camellia PARAS, DO  Multiple Vitamins-Minerals (MULTIVITAMIN WITH MINERALS) tablet  Take 1 tablet by mouth daily.    [provider]  naproxen  (NAPROSYN ) 500 MG tablet Take 1 tablet (500 mg total) by mouth 2 (two) times daily with a meal. 01/01/24   Arlander Charleston, MD  nystatin  (MYCOSTATIN /NYSTOP ) powder Apply 1 Application topically 3 (three) times daily as needed (yeast/fungal rashes or intertrigo). 01/31/24   Pender, Julie F, FNP  primidone  (MYSOLINE ) 50 MG tablet Take 50 mg by mouth in the morning and at bedtime.    [provider]  promethazine  (PHENERGAN ) 25 MG tablet Take 25 mg by mouth every 6 (six) hours as needed for nausea or vomiting.    [provider]  psyllium (METAMUCIL) 58.6 % powder Take 1 packet by mouth as needed. Patient not taking: Reported on 01/22/2024    [provider]  rOPINIRole  (REQUIP  XL) 4 MG 24 hr tablet Take 4 mg by mouth at bedtime.    [provider]  sulfamethoxazole -trimethoprim  (BACTRIM  DS) 800-160 MG tablet Take 1 tablet by mouth 2 (two) times daily. Patient not taking: Reported on 02/24/2024 01/01/24   Arlander Charleston, MD  Tiotropium Bromide  (SPIRIVA  RESPIMAT) 2.5 MCG/ACT AERS INHALE 2 PUFFS INTO THE LUNGS DAILY 02/17/24   Pender, Julie F, FNP  tiZANidine  (ZANAFLEX ) 2 MG tablet TAKE ONE (1) TO TWO  (2) TABLETS (2-4 MG TOTAL) BY MOUTH EVERY EIGHT HOURS AS NEEDED FOR MUSCLE SPASMS. 01/30/24   Pender, Julie F, FNP  torsemide  (DEMADEX ) 20 MG tablet Take 1 tablet (20 mg total) by mouth daily. 01/22/24   Pender, Julie F, FNP  umeclidinium bromide  (INCRUSE ELLIPTA ) 62.5 MCG/ACT AEPB Inhale 1 puff into the lungs daily. 01/20/24   Bernardo Fend, DO    Physical Exam: Vitals:   02/25/24 0956 02/25/24 0958 02/25/24 1106 02/25/24 1300  BP: 114/62   137/61  Pulse: (!) 106   99  Resp: (!) 24   (!) 22  Temp: 97.8 F (36.6 C)     TempSrc: Oral     SpO2: 94%  95% 96%  Weight:  110.9 kg    Height:  5' 1 (1.549 m)      Constitutional: NAD, calm, comfortable Vitals:   02/25/24 0956 02/25/24 0958 02/25/24 1106 02/25/24 1300  BP: 114/62   137/61  Pulse: (!) 106   99  Resp: (!) 24   (!) 22  Temp: 97.8 F (36.6 C)     TempSrc: Oral     SpO2: 94%  95% 96%  Weight:  110.9 kg    Height:  5' 1 (1.549 m)     Eyes: PERRL, lids and conjunctivae normal ENMT: Mucous membranes are moist. Posterior pharynx clear of any exudate or lesions.Normal dentition.  Neck: normal, supple, no masses, no thyromegaly Respiratory: Diminished breath sound bilaterally, diffused wheezing, no crackles, increasing respiratory effort. No accessory muscle use.  Cardiovascular: Regular rate and rhythm, no murmurs / rubs / gallops. No extremity edema. 2+ pedal pulses. No carotid bruits.  Abdomen: no tenderness, no masses palpated. No hepatosplenomegaly. Bowel sounds positive.  Musculoskeletal: no clubbing / cyanosis. No joint deformity upper and lower extremities. Good ROM, no contractures. Normal muscle tone.  Skin: no rashes, lesions, ulcers. No induration Neurologic: CN 2-12 grossly intact. Sensation intact, DTR normal. Strength 5/5 in all 4.  Psychiatric: Normal judgment and insight. Alert and oriented x 3. Normal mood.     Labs on Admission: I have personally reviewed following labs and imaging  studies  CBC: Recent Labs  Lab 02/25/24 1023  WBC 5.5  HGB 13.5  HCT 40.3  MCV 88.0  PLT 192   Basic Metabolic Panel: Recent Labs  Lab 02/25/24 1023  NA 130*  K 3.6  CL 90*  CO2 28  GLUCOSE 148*  BUN 7*  CREATININE 0.84  CALCIUM  9.3   GFR: Estimated Creatinine Clearance: 78 mL/min (by C-G formula based on SCr of 0.84 mg/dL). Liver Function Tests: Recent Labs  Lab 02/25/24 1023  AST 21  ALT 12  ALKPHOS 80  BILITOT 0.3  PROT 7.1  ALBUMIN 4.0   No results for input(s): LIPASE, AMYLASE in the last 168 hours. No results for input(s): AMMONIA in the last 168 hours. Coagulation Profile: No results for input(s): INR, PROTIME in the last 168 hours. Cardiac Enzymes: No results for input(s): CKTOTAL, CKMB, CKMBINDEX, TROPONINI in the last 168 hours. BNP (last 3 results) Recent Labs    01/08/24 1034 01/10/24 1321 02/25/24 1023  PROBNP 347.0* 636.0* 207.0   HbA1C: No results for input(s): HGBA1C in the last 72 hours. CBG: No results for input(s): GLUCAP in the last 168 hours. Lipid Profile: No results for input(s): CHOL, HDL, LDLCALC, TRIG, CHOLHDL, LDLDIRECT in the last 72 hours. Thyroid  Function Tests: No results for input(s): TSH, T4TOTAL, FREET4, T3FREE, THYROIDAB in the last 72 hours. Anemia Panel: No results for input(s): VITAMINB12, FOLATE, FERRITIN, TIBC, IRON , RETICCTPCT in the last 72 hours. Urine analysis:    Component Value Date/Time   COLORURINE COLORLESS (A) 01/08/2024 1242   APPEARANCEUR CLEAR (A) 01/08/2024 1242   APPEARANCEUR Clear 06/15/2014 0241   LABSPEC 1.003 (L) 01/08/2024 1242   LABSPEC 1.024 06/15/2014 0241   PHURINE 7.0 01/08/2024 1242   GLUCOSEU NEGATIVE 01/08/2024 1242   GLUCOSEU Negative 06/15/2014 0241   HGBUR NEGATIVE 01/08/2024 1242   BILIRUBINUR NEGATIVE 01/08/2024 1242   BILIRUBINUR Negative 06/15/2014 0241   KETONESUR NEGATIVE 01/08/2024 1242   PROTEINUR NEGATIVE  01/08/2024 1242   NITRITE NEGATIVE 01/08/2024 1242   LEUKOCYTESUR NEGATIVE 01/08/2024 1242   LEUKOCYTESUR Negative 06/15/2014 0241    Radiological Exams on Admission: DG Chest Portable 1 View Result Date: 02/25/2024 CLINICAL DATA:  Shortness of breath EXAM: PORTABLE CHEST 1 VIEW COMPARISON:  January 10, 2024 FINDINGS: The heart size and mediastinal contours are within normal limits. Both lungs are clear. The visualized skeletal structures are unremarkable. IMPRESSION: No active disease. Electronically Signed   By: Lynwood Landy Raddle M.D.   On: 02/25/2024 11:40    EKG: Independently reviewed.  Sinus rhythm, tachycardia, no acute ST changes.  QTc= 525  Assessment/Plan Active Problems:   COPD exacerbation (HCC)   Influenza A  (please populate well all problems here in Problem List. (For example, if patient is on BP meds at home and you resume or decide to hold them, it is a problem that needs to be her. Same for CAD, COPD, HLD and so on)  Acute COPD exacerbation Acute on chronic hypoxic respiratory failure -Likely triggered by influenza A infection -Continue IV Solu-Medrol  -Short course of antibiotics with doxycycline  -Continue ICS and LABA and Spiriva  -DuoNebs and as needed albuterol  - Incentive spirometry -I tried to talk to patient to use BiPAP to relieve breathing effort, patient however declined offer, claiming that she is extremely claustrophobic - Other DDx, patient appeared to be euvolemic, low suspicion for CHF decompensation.  Continue home dose of p.o. Lasix .  Influenza A infection - Out of window for Tamiflu  HTN Chronic HFpEF - Probably euvolemic - Continue home dose of p.o. torsemide   Hyponatremia - Chronic,  euvolemic - Probably related to her COPD  Prolonged Qtc -Probably heart rate/tachycardia related - Recheck EKG tomorrow morning  Bipolar disorder Anxiety - Mentation at baseline, continue Lexapro  - Depakote   Morbid obesity - BMI> 46 - Calorie  control recommended  DVT prophylaxis: Lovenox  Code Status: Full code Family Communication: None Disposition Plan: Expect less than 2 midnight hospital stay Consults called: \None Admission status: PCU observation  Cort ONEIDA Mana MD [facility] Pager [phone]  02/25/2024, 1:22 PM       [1]  Allergies Allergen Reactions   Glycopyrronium Dermatitis    glycopyrronium   Chantix  [Varenicline ] Nausea Only   Glycopyrrolate  Rash    Oral irritation   "

## 2024-02-25 NOTE — ED Provider Notes (Signed)
 "  [facility] Provider Note    Event Date/Time   First MD Initiated Contact with Patient 02/25/24 [phone]     (approximate)   History   Shortness of Breath   HPI  Nicole Austin is a 63 y.o. female with a past medical history of COPD, CHF, hyperlipidemia, presenting to the emergency department via EMS complaining of worsening shortness of breath over the last 4 days.  Patient also reports that she has a cough that is making her shortness of breath worse.  She does wear 2 L nasal cannula at baseline.  She states that a family member is sick with similar symptoms but she does not know what they have.  EMS treated the patient with 2 DuoNebs, 125 mg of Solu-Medrol , and 2 g of magnesium .     Physical Exam   Triage Vital Signs: ED Triage Vitals  Encounter Vitals Group     BP      Girls Systolic BP Percentile      Girls Diastolic BP Percentile      Boys Systolic BP Percentile      Boys Diastolic BP Percentile      Pulse      Resp      Temp      Temp src      SpO2      Weight      Height      Head Circumference      Peak Flow      Pain Score      Pain Loc      Pain Education      Exclude from Growth Chart     Most recent vital signs: There were no vitals filed for this visit.   General: Awake, no distress.  CV:  Good peripheral perfusion.  Mildly tachycardic Resp:  Mildly tachypneic, coarse rhonchi throughout all lung fields Abd:  No distention.  Other:  1+ pitting edema to bilateral lower extremities   ED Results / Procedures / Treatments   Labs (all labs ordered are listed, but only abnormal results are displayed) Labs Reviewed  RESP PANEL BY RT-PCR (RSV, FLU A&B, COVID)  [id number]  CBC  COMPREHENSIVE METABOLIC PANEL WITH GFR  PRO BRAIN NATRIURETIC PEPTIDE  BLOOD GAS, VENOUS  TROPONIN T, HIGH SENSITIVITY     EKG  ED ECG REPORT I, Reche CHRISTELLA Leventhal, the attending physician, personally viewed and interpreted this ECG.  Date:  02/25/2024 Rate: 110bpm Rhythm: Sinus tachycardia QRS Axis: normal Intervals: normal ST/T Wave abnormalities: normal Narrative Interpretation: no evidence of acute ischemia    RADIOLOGY Patient's chest x-ray was independently reviewed and interpreted by myself as no acute pathology    PROCEDURES:  Critical Care performed: No  Procedures   MEDICATIONS ORDERED IN ED: Medications - No data to display   IMPRESSION / MDM / ASSESSMENT AND PLAN / ED COURSE  I reviewed the triage vital signs and the nursing notes.                               Differential includes, but is not limited to, viral syndrome, bronchitis including COPD exacerbation, pneumonia, reactive airway disease including asthma, CHF including exacerbation with or without pulmonary/interstitial edema, pneumothorax, ACS, thoracic trauma, and pulmonary embolism.   Patient's presentation is most consistent with severe exacerbation of chronic illness.  Patient is a 63 year old female with a past medical history of CHF, COPD, presenting  to the emergency department via EMS for shortness of breath.  The patient has already received 2 DuoNeb treatments, 125 mg of Solu-Medrol , and 2 g of magnesium  by EMS.  On workup the patient's influenza swab is positive.  Blood work overall is unremarkable.  BNP is 207.  Troponin is less than 15.  Normal white blood cell count.  Sodium is slightly low at 130 however this does appear to be her norm.  I discussed results at length with the patient who continues to have significant wheezing on exam despite treatments.  Another DuoNeb treatment was ordered.  I discussed with her that I would like to admit her for further evaluation and treatment of her COPD exacerbation and she is agreeable.  I discussed the patient's case with the hospitalist who is in agreement with plan for admission    FINAL CLINICAL IMPRESSION(S) / ED DIAGNOSES   Final diagnoses:  COPD exacerbation (HCC)  Influenza      Rx / DC Orders   ED Discharge Orders     None        Note:  This document was prepared using Dragon voice recognition software and may include unintentional dictation errors.   Rexford Reche HERO, MD 02/25/24 1423  "

## 2024-02-25 NOTE — ED Triage Notes (Signed)
 BIBEMS from home. Pt c/o SOB and nonproductive cough that's been going on for 4 days. Hx of COPD, 2L [place] baseline. Rhonchi and wheezing heard bilaterally on auscultation. Pt received 2 DuoNebs, 125 mg of IV solumedrol, and 2 g of Mag en route by EMS. Pt also c/o R sided chest pain when they take a deep breath.  EMS VS: 137/77 100 HR 94% on 2L [place] --> 99% after DuoNebs

## 2024-02-25 NOTE — ED Notes (Signed)
Pt assisted to [facility] and had BM

## 2024-02-25 NOTE — Patient Outreach (Signed)
 RNCM - patient advised to go to ED yesterday, no ED visit/hospitalization noted. Attempted to contact  patient, unable to leave message.   RNCM spoke with DPR - sister Levada who reported she spoke with patient last night and also told her she needed to go to ED, and patient replied she would go if she felt worse. Notified sister I attempted to call patient today and no answer, advised someone check on her this morning and sister verbalized understanding.

## 2024-02-26 ENCOUNTER — Encounter: Payer: Self-pay | Admitting: Internal Medicine

## 2024-02-26 DIAGNOSIS — J441 Chronic obstructive pulmonary disease with (acute) exacerbation: Secondary | ICD-10-CM | POA: Diagnosis not present

## 2024-02-26 LAB — SODIUM, URINE, RANDOM: Sodium, Ur: 84 mmol/L

## 2024-02-26 LAB — TSH: TSH: 0.687 u[IU]/mL (ref 0.350–4.500)

## 2024-02-26 LAB — HIV ANTIBODY (ROUTINE TESTING W REFLEX): HIV Screen 4th Generation wRfx: NONREACTIVE

## 2024-02-26 MED ORDER — ALBUTEROL SULFATE (2.5 MG/3ML) 0.083% IN NEBU: 2.5000 mg | INHALATION_SOLUTION | RESPIRATORY_TRACT | Status: DC | PRN

## 2024-02-26 MED ORDER — IPRATROPIUM-ALBUTEROL 0.5-2.5 (3) MG/3ML IN SOLN
3.0000 mL | Freq: Three times a day (TID) | RESPIRATORY_TRACT | Status: DC
  Administered 2024-02-26 – 2024-02-27 (×4): 3 mL via RESPIRATORY_TRACT
  Filled 2024-02-26 (×4): qty 3

## 2024-02-26 MED ORDER — TORSEMIDE 20 MG PO TABS
20.0000 mg | ORAL_TABLET | Freq: Every day | ORAL | Status: DC
  Administered 2024-02-27: 20 mg via ORAL
  Filled 2024-02-26: qty 1

## 2024-02-26 MED ORDER — CHLORHEXIDINE GLUCONATE CLOTH 2 % EX PADS
6.0000 | MEDICATED_PAD | Freq: Every day | CUTANEOUS | Status: DC
  Administered 2024-02-26 – 2024-02-27 (×2): 6 via TOPICAL

## 2024-02-26 MED ORDER — MUPIROCIN 2 % EX OINT
1.0000 | TOPICAL_OINTMENT | Freq: Two times a day (BID) | CUTANEOUS | Status: DC
  Administered 2024-02-26 – 2024-02-27 (×3): 1 via NASAL
  Filled 2024-02-26: qty 22

## 2024-02-26 NOTE — Progress Notes (Signed)
 " PROGRESS NOTE    Nicole Austin  FMW:[id number] DOB: [birth year] DOA: 02/25/2024 PCP: Gareth Mliss FALCON, FNP     Brief Narrative:   Nicole Austin is a 63 y.o. female with medical history significant of COPD Gold stage III, chronic hypoxic respiratory failure on 2 L as needed, HTN/chronic HFpEF, bipolar disorder, morbid obesity, presented with worsening of cough wheezing shortness of breath.   Symptoms started 4-5 days ago open patient started to have sore throat runny nose with dry cough density started to have wheezing and increasing exertional dyspnea.  Denied any chest pain no fever or chills.  Symptoms became worse yesterday, despite using around-the-clock breathing treatments she continued to experience worsening shortness of breath, she felt out of breath at rest this morning and came to ED.  She has not received flu shot this year.   Assessment & Plan:   Active Problems:   COPD (chronic obstructive pulmonary disease) (HCC)   Bipolar disorder with depression (HCC)   Class 3 severe obesity with serious comorbidity and body mass index (BMI) of 45.0 to 49.9 in adult [facility])   Sleep apnea   Chronic prescription benzodiazepine use   COPD exacerbation (HCC)   CHF (congestive heart failure) (HCC)   Influenza A  # Influenza A Out of window for tamiflu - supportive care  # COPD with acute exacerbation 2/2 flu. Improving. - continue steroids, doxy, breathing treatments - is on triple therapy at baseline  # Chronic hypoxic respiratory failure - stable on home 2 liters  # OSA - cpap qhs  # Debility Reports still feeling very weak. Lives alone. - PT consult  # Hyponatremia Chronic problem, sodium of 130 actually improved from baseline. May be siadh from depakote , lexapro . As has improved after starting torsemide  may be 2/2 diastolic chf. Hypoosmolar last month - check tsh, urine sodium, urine osm  # HTN Bp low normal here - home meds on hold  # HFpEF Grade 1 dd  on recent TTE no other sig abnormalities. Normal bnp and troponin here - cont home torsemide   # Bipolar - continue home lexapro , depakote , xanax   # Obesity noted   DVT prophylaxis: lovenox  Code Status: full Family Communication: none at bedside  Level of care: Progressive Status is: Observation     Consultants:  none  Procedures: none  Antimicrobials:  doxy    Subjective: Reports interval improvement in fatigue and dyspnea but still complains of both  Objective: Vitals:   02/26/24 0516 02/26/24 0826 02/26/24 0840 02/26/24 1117  BP:   (!) 132/54 (!) 101/50  Pulse:   84 86  Resp:  17 16   Temp:   98.2 F (36.8 C) 97.8 F (36.6 C)  TempSrc:    Oral  SpO2:   91% 93%  Weight: 107.9 kg     Height:        Intake/Output Summary (Last 24 hours) at 02/26/2024 1157 Last data filed at 02/25/2024 1756 Gross per 24 hour  Intake --  Output 700 ml  Net -700 ml   Filed Weights   02/25/24 0958 02/26/24 0516  Weight: 110.9 kg 107.9 kg    Examination:  General exam: Appears calm and comfortable  Respiratory system: faint exp wheeze Cardiovascular system: S1 & S2 heard, RRR.  Gastrointestinal system: Abdomen is obese, soft and nontender. No organomegaly or masses felt.  Central nervous system: Alert and oriented. No focal neurological deficits. Extremities: Symmetric 5 x 5 power. Trace LE edema Skin: No rashes, lesions  or ulcers Psychiatry: Judgement and insight appear normal. Mood & affect appropriate.     Data Reviewed: I have personally reviewed following labs and imaging studies  CBC: Recent Labs  Lab 02/25/24 1023  WBC 5.5  HGB 13.5  HCT 40.3  MCV 88.0  PLT 192   Basic Metabolic Panel: Recent Labs  Lab 02/25/24 1023  NA 130*  K 3.6  CL 90*  CO2 28  GLUCOSE 148*  BUN 7*  CREATININE 0.84  CALCIUM  9.3   GFR: Estimated Creatinine Clearance: 76.7 mL/min (by C-G formula based on SCr of 0.84 mg/dL). Liver Function Tests: Recent Labs  Lab  02/25/24 1023  AST 21  ALT 12  ALKPHOS 80  BILITOT 0.3  PROT 7.1  ALBUMIN 4.0   No results for input(s): LIPASE, AMYLASE in the last 168 hours. No results for input(s): AMMONIA in the last 168 hours. Coagulation Profile: No results for input(s): INR, PROTIME in the last 168 hours. Cardiac Enzymes: No results for input(s): CKTOTAL, CKMB, CKMBINDEX, TROPONINI in the last 168 hours. BNP (last 3 results) Recent Labs    01/08/24 1034 01/10/24 1321 02/25/24 1023  PROBNP 347.0* 636.0* 207.0   HbA1C: No results for input(s): HGBA1C in the last 72 hours. CBG: No results for input(s): GLUCAP in the last 168 hours. Lipid Profile: No results for input(s): CHOL, HDL, LDLCALC, TRIG, CHOLHDL, LDLDIRECT in the last 72 hours. Thyroid  Function Tests: No results for input(s): TSH, T4TOTAL, FREET4, T3FREE, THYROIDAB in the last 72 hours. Anemia Panel: No results for input(s): VITAMINB12, FOLATE, FERRITIN, TIBC, IRON , RETICCTPCT in the last 72 hours. Urine analysis:    Component Value Date/Time   COLORURINE COLORLESS (A) 01/08/2024 1242   APPEARANCEUR CLEAR (A) 01/08/2024 1242   APPEARANCEUR Clear 06/15/2014 0241   LABSPEC 1.003 (L) 01/08/2024 1242   LABSPEC 1.024 06/15/2014 0241   PHURINE 7.0 01/08/2024 1242   GLUCOSEU NEGATIVE 01/08/2024 1242   GLUCOSEU Negative 06/15/2014 0241   HGBUR NEGATIVE 01/08/2024 1242   BILIRUBINUR NEGATIVE 01/08/2024 1242   BILIRUBINUR Negative 06/15/2014 0241   KETONESUR NEGATIVE 01/08/2024 1242   PROTEINUR NEGATIVE 01/08/2024 1242   NITRITE NEGATIVE 01/08/2024 1242   LEUKOCYTESUR NEGATIVE 01/08/2024 1242   LEUKOCYTESUR Negative 06/15/2014 0241   Sepsis Labs: @LABRCNTIP (procalcitonin:4,lacticidven:4)  ) Recent Results (from the past 240 hours)  Resp panel by RT-PCR (RSV, Flu A&B, Covid) Anterior Nasal Swab     Status: Abnormal   Collection Time: 02/25/24 10:23 AM   Specimen: Anterior Nasal  Swab  Result Value Ref Range Status   SARS Coronavirus 2 by RT PCR NEGATIVE NEGATIVE Final    Comment: (NOTE) SARS-CoV-2 target nucleic acids are NOT DETECTED.  The SARS-CoV-2 RNA is generally detectable in upper respiratory specimens during the acute phase of infection. The lowest concentration of SARS-CoV-2 viral copies this assay can detect is 138 copies/mL. A negative result does not preclude SARS-Cov-2 infection and should not be used as the sole basis for treatment or other patient management decisions. A negative result may occur with  improper specimen collection/handling, submission of specimen other than nasopharyngeal swab, presence of viral mutation(s) within the areas targeted by this assay, and inadequate number of viral copies(<138 copies/mL). A negative result must be combined with clinical observations, patient history, and epidemiological information. The expected result is Negative.  Fact Sheet for Patients:  bloggercourse.com  Fact Sheet for Healthcare Providers:  seriousbroker.it  This test is no t yet approved or cleared by the [place]  FDA and  has  been authorized for detection and/or diagnosis of SARS-CoV-2 by FDA under an Emergency Use Authorization (EUA). This EUA will remain  in effect (meaning this test can be used) for the duration of the COVID-19 declaration under Section 564(b)(1) of the Act, 21 U.S.C.section 360bbb-3(b)(1), unless the authorization is terminated  or revoked sooner.       Influenza A by PCR POSITIVE (A) NEGATIVE Final   Influenza B by PCR NEGATIVE NEGATIVE Final    Comment: (NOTE) The Xpert Xpress SARS-CoV-2/FLU/RSV plus assay is intended as an aid in the diagnosis of influenza from Nasopharyngeal swab specimens and should not be used as a sole basis for treatment. Nasal washings and aspirates are unacceptable for Xpert Xpress SARS-CoV-2/FLU/RSV testing.  Fact Sheet for  Patients: bloggercourse.com  Fact Sheet for Healthcare Providers: seriousbroker.it  This test is not yet approved or cleared by the [place]  FDA and has been authorized for detection and/or diagnosis of SARS-CoV-2 by FDA under an Emergency Use Authorization (EUA). This EUA will remain in effect (meaning this test can be used) for the duration of the COVID-19 declaration under Section 564(b)(1) of the Act, 21 U.S.C. section 360bbb-3(b)(1), unless the authorization is terminated or revoked.     Resp Syncytial Virus by PCR NEGATIVE NEGATIVE Final    Comment: (NOTE) Fact Sheet for Patients: bloggercourse.com  Fact Sheet for Healthcare Providers: seriousbroker.it  This test is not yet approved or cleared by the [place]  FDA and has been authorized for detection and/or diagnosis of SARS-CoV-2 by FDA under an Emergency Use Authorization (EUA). This EUA will remain in effect (meaning this test can be used) for the duration of the COVID-19 declaration under Section 564(b)(1) of the Act, 21 U.S.C. section 360bbb-3(b)(1), unless the authorization is terminated or revoked.  Performed at [facility], [address], [place], [place] [address]          Radiology Studies: DG Chest Portable 1 View Result Date: 02/25/2024 CLINICAL DATA:  Shortness of breath EXAM: PORTABLE CHEST 1 VIEW COMPARISON:  January 10, 2024 FINDINGS: The heart size and mediastinal contours are within normal limits. Both lungs are clear. The visualized skeletal structures are unremarkable. IMPRESSION: No active disease. Electronically Signed   By: Lynwood Landy Raddle M.D.   On: 02/25/2024 11:40        Scheduled Meds:  divalproex   250 mg Oral BID   doxycycline   100 mg Oral Q12H   enoxaparin  (LOVENOX ) injection  0.5 mg/kg Subcutaneous Q24H   escitalopram   20 mg Oral Daily   fluticasone   furoate-vilanterol  1 puff Inhalation Daily   ipratropium-albuterol   3 mL Nebulization TID   [START ON 02/27/2024] predniSONE   40 mg Oral Q breakfast   primidone   50 mg Oral BID   rOPINIRole   4 mg Oral QHS   umeclidinium bromide   1 puff Inhalation Daily   Continuous Infusions:   LOS: 0 days     Devaughn KATHEE Ban, MD [facility]   If 7PM-7AM, please contact night-coverage [email or web address] Password [email or web address] 02/26/2024, 11:57 AM     "

## 2024-02-26 NOTE — Care Management Obs Status (Signed)
 MEDICARE OBSERVATION STATUS NOTIFICATION   Patient Details  Name: Nicole Austin MRN: [id number] Date of Birth: 1961-02-04   Medicare Observation Status Notification Given:  Yes    [name] 02/26/2024, 1:51 PM

## 2024-02-27 DIAGNOSIS — J441 Chronic obstructive pulmonary disease with (acute) exacerbation: Secondary | ICD-10-CM | POA: Diagnosis not present

## 2024-02-27 LAB — OSMOLALITY, URINE: Osmolality, Ur: 345 mosm/kg (ref 300–900)

## 2024-02-27 MED ORDER — NYSTATIN 100000 UNIT/GM EX POWD: 1.0000 | Freq: Three times a day (TID) | CUTANEOUS | 2 refills | Status: AC | PRN

## 2024-02-27 MED ORDER — PREDNISONE 20 MG PO TABS: 40.0000 mg | ORAL_TABLET | Freq: Every day | ORAL | 0 refills | Status: AC

## 2024-02-27 MED ORDER — DOXYCYCLINE HYCLATE 100 MG PO TABS: 100.0000 mg | ORAL_TABLET | Freq: Two times a day (BID) | ORAL | 0 refills | Status: DC

## 2024-02-27 NOTE — Evaluation (Signed)
 Physical Therapy Evaluation Patient Details Name: Nicole Austin MRN: [id number] DOB: Nov 02, 1960 Today's Date: 02/27/2024  History of Present Illness  Nicole Austin is a 64 y.o. female with medical history significant of COPD Gold stage III, chronic hypoxic respiratory failure on 2 L as needed, HTN/chronic HFpEF, bipolar disorder, morbid obesity, presented with worsening of cough wheezing shortness of breath.  Clinical Impression  Patient received in bed, she is very happy to see me, she wants to get up and walk. Patient is mod I with bed mobility. Increased time and rest breaks needed during session due to fatigue and SOB. Patient is able to stand with supervision and RW and ambulated 15 feet, then after seated rest another 30 feet with supervision. Patient will continue to benefit from skilled PT to improve endurance and strength.          If plan is discharge home, recommend the following: A little help with walking and/or transfers;A little help with bathing/dressing/bathroom   Can travel by private vehicle    yes    Equipment Recommendations None recommended by PT  Recommendations for Other Services       Functional Status Assessment       Precautions / Restrictions Precautions Precautions: Fall Recall of Precautions/Restrictions: Intact Restrictions Weight Bearing Restrictions Per Provider Order: No      Mobility  Bed Mobility Overal bed mobility: Modified Independent                  Transfers Overall transfer level: Modified independent Equipment used: Rolling walker (2 wheels)                    Ambulation/Gait Ambulation/Gait assistance: Supervision Gait Distance (Feet): 45 Feet Assistive device: Rolling walker (2 wheels) Gait Pattern/deviations: Step-through pattern, Decreased step length - right, Decreased step length - left, Decreased stride length Gait velocity: decr     General Gait Details: Ambulated 15 feet then another 30  feet with RW and supervision. No lob, O2 sats remained in 90%s throughout on 4 liters.  Stairs            Wheelchair Mobility     Tilt Bed    Modified Rankin (Stroke Patients Only)       Balance Overall balance assessment: Modified Independent                                           Pertinent Vitals/Pain Pain Assessment Pain Assessment: No/denies pain    Home Living Family/patient expects to be discharged to:: Private residence Living Arrangements: Alone Available Help at Discharge: Family;Friend(s);Available PRN/intermittently;Personal care attendant (has personal care aide 7 days a week for 2 hours a day) Type of Home: Apartment Home Access: Stairs to enter   Entrance Stairs-Number of Steps: 1   Home Layout: One level Home Equipment: Agricultural Consultant (2 wheels);Rollator (4 wheels);Cane - single point;Wheelchair - manual;Shower seat Additional Comments: Pt endorses best friend lives in apartment above her can stay with her for a few days when she returns home and can assist as needed    Prior Function Prior Level of Function : Independent/Modified Independent             Mobility Comments: use came with household navigation and modI for ADLs and self care task ADLs Comments: Pt reports being Mod I with SPC for mobility and able to perform ADLs  and IADLs     Extremity/Trunk Assessment   Upper Extremity Assessment Upper Extremity Assessment: Overall WFL for tasks assessed    Lower Extremity Assessment Lower Extremity Assessment: Generalized weakness    Cervical / Trunk Assessment Cervical / Trunk Assessment: Normal  Communication   Communication Communication: No apparent difficulties    Cognition Arousal: Alert Behavior During Therapy: WFL for tasks assessed/performed   PT - Cognitive impairments: No apparent impairments                         Following commands: Intact       Cueing Cueing Techniques: Verbal  cues     General Comments      Exercises     Assessment/Plan    PT Assessment Patient needs continued PT services  PT Problem List Decreased strength;Decreased activity tolerance;Decreased mobility       PT Treatment Interventions Gait training;Functional mobility training;Therapeutic activities;Therapeutic exercise;Patient/family education    PT Goals (Current goals can be found in the Care Plan section)  Acute Rehab PT Goals Patient Stated Goal: improve, return home PT Goal Formulation: With patient Time For Goal Achievement: 03/12/24 Potential to Achieve Goals: Good    Frequency Min 2X/week     Co-evaluation               AM-PAC PT 6 Clicks Mobility  Outcome Measure Help needed turning from your back to your side while in a flat bed without using bedrails?: None Help needed moving from lying on your back to sitting on the side of a flat bed without using bedrails?: None Help needed moving to and from a bed to a chair (including a wheelchair)?: None Help needed standing up from a chair using your arms (e.g., wheelchair or bedside chair)?: A Little Help needed to walk in hospital room?: A Little Help needed climbing 3-5 steps with a railing? : A Lot 6 Click Score: 20    End of Session Equipment Utilized During Treatment: Oxygen  Activity Tolerance: Patient limited by fatigue Patient left: in chair;with call bell/phone within reach Nurse Communication: Mobility status PT Visit Diagnosis: Muscle weakness (generalized) (M62.81);Difficulty in walking, not elsewhere classified (R26.2)    Time: 8769-8750 PT Time Calculation (min) (ACUTE ONLY): 19 min   Charges:   PT Evaluation $PT Eval Low Complexity: 1 Low PT Treatments $Gait Training: 8-22 mins PT General Charges $$ ACUTE PT VISIT: 1 Visit         Aimar Borghi, PT, GCS 02/27/2024,1:00 PM

## 2024-02-27 NOTE — Plan of Care (Signed)

## 2024-02-27 NOTE — Discharge Summary (Signed)
 Nicole Austin FMW:[id number] DOB: [birth year] DOA: 02/25/2024  PCP: Gareth Mliss FALCON, FNP  Admit date: 02/25/2024 Discharge date: 02/27/2024  Time spent: 35 minutes  Recommendations for Outpatient Follow-up:  Pcp f/u 1 week     Discharge Diagnoses:  Principal Problem:   COPD exacerbation (HCC) Active Problems:   COPD (chronic obstructive pulmonary disease) (HCC)   Bipolar disorder with depression (HCC)   Class 3 severe obesity with serious comorbidity and body mass index (BMI) of 45.0 to 49.9 in adult [facility])   Sleep apnea   Chronic prescription benzodiazepine use   CHF (congestive heart failure) (HCC)   Influenza A   Discharge Condition: stable  Diet recommendation: heart healthy  Filed Weights   02/25/24 0958 02/26/24 0516  Weight: 110.9 kg 107.9 kg    History of present illness:  From admission h an dp Nicole Austin is a 64 y.o. female with medical history significant of COPD Gold stage III, chronic hypoxic respiratory failure on 2 L as needed, HTN/chronic HFpEF, bipolar disorder, morbid obesity, presented with worsening of cough wheezing shortness of breath.   Symptoms started 4-5 days ago open patient started to have sore throat runny nose with dry cough density started to have wheezing and increasing exertional dyspnea.  Denied any chest pain no fever or chills.  Symptoms became worse yesterday, despite using around-the-clock breathing treatments she continued to experience worsening shortness of breath, she felt out of breath at rest this morning and came to ED.  She has not received flu shot this year.  Hospital Course:   Patient presents with uri symptoms. Found to have influenza a leading to copd exacerbation. Out of window for tamiflu. Treated copd exacerbation with steroids and antibiotics. Symptomatically improved, breathing comfortably on home 2 liters. PT advising home health which we ordered. Other chronic problems stable. Will complete a course of  steroids and abx. Advise pcp f/u one week.  Procedures: none   Consultations: none  Discharge Exam: Vitals:   02/27/24 1217 02/27/24 1251  BP: 122/67   Pulse: 75   Resp: 17   Temp: 98.7 F (37.1 C)   SpO2: 95% 91%    General: NAD Cardiovascular: RRR Respiratory: faint exp wheeze, normal wob  Discharge Instructions   Discharge Instructions     Increase activity slowly   Complete by: As directed       Allergies as of 02/27/2024       Reactions   Glycopyrronium Dermatitis   glycopyrronium   Chantix  [varenicline ] Nausea Only   Glycopyrrolate  Rash   Oral irritation        Medication List     STOP taking these medications    fluconazole  150 MG tablet Commonly known as: DIFLUCAN    Incruse Ellipta  62.5 MCG/ACT Aepb Generic drug: umeclidinium bromide    Iron  (Ferrous Sulfate ) 325 (65 Fe) MG Tabs   naproxen  500 MG tablet Commonly known as: Naprosyn    psyllium 58.6 % powder Commonly known as: METAMUCIL   sulfamethoxazole -trimethoprim  800-160 MG tablet Commonly known as: BACTRIM  DS       TAKE these medications    rOPINIRole  4 MG 24 hr tablet Commonly known as: REQUIP  XL Take 4 mg by mouth at bedtime. The timing of this medication is very important.   albuterol  108 (90 Base) MCG/ACT inhaler Commonly known as: VENTOLIN  HFA Inhale 2 puffs into the lungs every 6 (six) hours as needed for wheezing or shortness of breath.   ALPRAZolam  0.5 MG tablet Commonly known as: XANAX  Take  1 tablet (0.5 mg total) by mouth 2 (two) times daily.   cyanocobalamin  1000 MCG tablet Commonly known as: VITAMIN B12 TAKE 1 TABLET (1,000 MCG TOTAL) BY MOUTH DAILY.   divalproex  250 MG DR tablet Commonly known as: DEPAKOTE  Take 1 tablet (250 mg total) by mouth 2 (two) times daily.   doxycycline  100 MG tablet Commonly known as: VIBRA -TABS Take 1 tablet (100 mg total) by mouth every 12 (twelve) hours.   escitalopram  20 MG tablet Commonly known as: LEXAPRO  Take 20 mg  by mouth daily.   ipratropium-albuterol  0.5-2.5 (3) MG/3ML Soln Commonly known as: DUONEB TAKE 3 MLS BY NEBULIZATION 3 TIMES DAILY AS NEEDED   magnesium  oxide 400 (240 Mg) MG tablet Commonly known as: MAG-OX TAKE ONE TABLET (400 MG TOTAL) BY MOUTH DAILY.   miconazole  2 % powder Commonly known as: MICOTIN Apply topically 2 (two) times daily as needed for itching (Candidal intertrigo).   mometasone -formoterol  100-5 MCG/ACT Aero Commonly known as: DULERA  Inhale 2 puffs into the lungs 2 (two) times daily.   multivitamin with minerals tablet Take 1 tablet by mouth daily.   nystatin  powder Commonly known as: MYCOSTATIN /NYSTOP  Apply 1 Application topically 3 (three) times daily as needed (yeast/fungal rashes or intertrigo).   predniSONE  20 MG tablet Commonly known as: DELTASONE  Take 2 tablets (40 mg total) by mouth daily with breakfast for 3 days. Start taking on: February 28, 2024   primidone  50 MG tablet Commonly known as: MYSOLINE  Take 25 mg by mouth in the morning and at bedtime.   promethazine  25 MG tablet Commonly known as: PHENERGAN  Take 25 mg by mouth every 6 (six) hours as needed for nausea or vomiting.   Spiriva  Respimat 2.5 MCG/ACT Aers Generic drug: Tiotropium Bromide  INHALE 2 PUFFS INTO THE LUNGS DAILY   tiZANidine  2 MG tablet Commonly known as: ZANAFLEX  TAKE ONE (1) TO TWO (2) TABLETS (2-4 MG TOTAL) BY MOUTH EVERY EIGHT HOURS AS NEEDED FOR MUSCLE SPASMS.   torsemide  20 MG tablet Commonly known as: DEMADEX  Take 1 tablet (20 mg total) by mouth daily.       Allergies[1]  Follow-up Information     Gareth Mliss FALCON, FNP Follow up.   Specialty: Nurse Practitioner Why: in about 1 week Contact information: [address] [place] [address] [phone]                  The results of significant diagnostics from this hospitalization (including imaging, microbiology, ancillary and laboratory) are listed below for reference.     Significant Diagnostic Studies: DG Chest Portable 1 View Result Date: 02/25/2024 CLINICAL DATA:  Shortness of breath EXAM: PORTABLE CHEST 1 VIEW COMPARISON:  January 10, 2024 FINDINGS: The heart size and mediastinal contours are within normal limits. Both lungs are clear. The visualized skeletal structures are unremarkable. IMPRESSION: No active disease. Electronically Signed   By: Lynwood Landy Raddle M.D.   On: 02/25/2024 11:40    Microbiology: Recent Results (from the past 240 hours)  Resp panel by RT-PCR (RSV, Flu A&B, Covid) Anterior Nasal Swab     Status: Abnormal   Collection Time: 02/25/24 10:23 AM   Specimen: Anterior Nasal Swab  Result Value Ref Range Status   SARS Coronavirus 2 by RT PCR NEGATIVE NEGATIVE Final    Comment: (NOTE) SARS-CoV-2 target nucleic acids are NOT DETECTED.  The SARS-CoV-2 RNA is generally detectable in upper respiratory specimens during the acute phase of infection. The lowest concentration of SARS-CoV-2 viral copies this assay  can detect is 138 copies/mL. A negative result does not preclude SARS-Cov-2 infection and should not be used as the sole basis for treatment or other patient management decisions. A negative result may occur with  improper specimen collection/handling, submission of specimen other than nasopharyngeal swab, presence of viral mutation(s) within the areas targeted by this assay, and inadequate number of viral copies(<138 copies/mL). A negative result must be combined with clinical observations, patient history, and epidemiological information. The expected result is Negative.  Fact Sheet for Patients:  bloggercourse.com  Fact Sheet for Healthcare Providers:  seriousbroker.it  This test is no t yet approved or cleared by the [place]  FDA and  has been authorized for detection and/or diagnosis of SARS-CoV-2 by FDA under an Emergency Use Authorization (EUA). This EUA will  remain  in effect (meaning this test can be used) for the duration of the COVID-19 declaration under Section 564(b)(1) of the Act, 21 U.S.C.section 360bbb-3(b)(1), unless the authorization is terminated  or revoked sooner.       Influenza A by PCR POSITIVE (A) NEGATIVE Final   Influenza B by PCR NEGATIVE NEGATIVE Final    Comment: (NOTE) The Xpert Xpress SARS-CoV-2/FLU/RSV plus assay is intended as an aid in the diagnosis of influenza from Nasopharyngeal swab specimens and should not be used as a sole basis for treatment. Nasal washings and aspirates are unacceptable for Xpert Xpress SARS-CoV-2/FLU/RSV testing.  Fact Sheet for Patients: bloggercourse.com  Fact Sheet for Healthcare Providers: seriousbroker.it  This test is not yet approved or cleared by the [place]  FDA and has been authorized for detection and/or diagnosis of SARS-CoV-2 by FDA under an Emergency Use Authorization (EUA). This EUA will remain in effect (meaning this test can be used) for the duration of the COVID-19 declaration under Section 564(b)(1) of the Act, 21 U.S.C. section 360bbb-3(b)(1), unless the authorization is terminated or revoked.     Resp Syncytial Virus by PCR NEGATIVE NEGATIVE Final    Comment: (NOTE) Fact Sheet for Patients: bloggercourse.com  Fact Sheet for Healthcare Providers: seriousbroker.it  This test is not yet approved or cleared by the [place]  FDA and has been authorized for detection and/or diagnosis of SARS-CoV-2 by FDA under an Emergency Use Authorization (EUA). This EUA will remain in effect (meaning this test can be used) for the duration of the COVID-19 declaration under Section 564(b)(1) of the Act, 21 U.S.C. section 360bbb-3(b)(1), unless the authorization is terminated or revoked.  Performed at [facility], [address] [place], [place]  [address]      Labs: Basic Metabolic Panel: Recent Labs  Lab 02/25/24 1023  NA 130*  K 3.6  CL 90*  CO2 28  GLUCOSE 148*  BUN 7*  CREATININE 0.84  CALCIUM  9.3   Liver Function Tests: Recent Labs  Lab 02/25/24 1023  AST 21  ALT 12  ALKPHOS 80  BILITOT 0.3  PROT 7.1  ALBUMIN 4.0   No results for input(s): LIPASE, AMYLASE in the last 168 hours. No results for input(s): AMMONIA in the last 168 hours. CBC: Recent Labs  Lab 02/25/24 1023  WBC 5.5  HGB 13.5  HCT 40.3  MCV 88.0  PLT 192   Cardiac Enzymes: No results for input(s): CKTOTAL, CKMB, CKMBINDEX, TROPONINI in the last 168 hours. BNP: BNP (last 3 results) Recent Labs    12/21/23 1438 01/22/24 1352  BNP 83.5 22    ProBNP (last 3 results) Recent Labs    01/08/24 1034 01/10/24 1321 02/25/24 1023  PROBNP 347.0* 636.0* 207.0    CBG: No results for input(s): GLUCAP in the last 168 hours.     Signed:  Devaughn KATHEE Ban MD.  [facility] 02/27/2024, 2:33 PM     [1]  Allergies Allergen Reactions   Glycopyrronium Dermatitis    glycopyrronium   Chantix  [Varenicline ] Nausea Only   Glycopyrrolate  Rash    Oral irritation

## 2024-02-28 ENCOUNTER — Other Ambulatory Visit: Payer: Self-pay | Admitting: Nurse Practitioner

## 2024-02-28 NOTE — Telephone Encounter (Signed)
 Requested medications are due for refill today.  yes  Requested medications are on the active medications list.  yes  Last refill. 01/17/2024 180 ml 1 rf  Future visit scheduled.   no  Notes to clinic.  New medication to this pt. Unclear if this is to be continued.    Requested Prescriptions  Pending Prescriptions Disp Refills   ipratropium-albuterol  (DUONEB) 0.5-2.5 (3) MG/3ML SOLN [Pharmacy Med Name: IPRATROPIUM BROMIDE/ALBUTEROL  SULFATE ALBUTER SOLUTION]  1    Sig: TAKE 3 MLS BY NEBULIZATION 3 TIMES DAILY AS NEEDED     Pulmonology:  Combination Products - albuterol  / ipratropium Passed - 02/28/2024  5:34 PM      Passed - Last BP in normal range    BP Readings from Last 1 Encounters:  02/27/24 129/60         Passed - Last Heart Rate in normal range    Pulse Readings from Last 1 Encounters:  02/27/24 97         Passed - Valid encounter within last 12 months    Recent Outpatient Visits           1 month ago Acute on chronic heart failure, unspecified heart failure type [facility])   [facility] Gareth Mliss FALCON, FNP   1 month ago    [facility] Gareth Mliss FALCON, FNP   5 months ago Mixed incontinence urge and stress   [facility] Leavy Mole, PA-C   6 months ago Left-sided low back pain with left-sided sciatica, unspecified chronicity   [facility] Leavy Mole, PA-C   6 months ago Muscle cramps   [facility] Leavy Mole, PA-C       Future Appointments             In 4 days Gollan, Timothy J, MD [facility]

## 2024-03-01 NOTE — Progress Notes (Deleted)
 Cardiology Office Note  Date:  03/01/2024   ID:  Nicole, Austin [birth year], MRN [id number]  PCP:  Gareth Mliss FALCON, FNP   No chief complaint on file.   HPI:  Nicole Austin is a 64 y.o. female with past medical history of: Past Medical History:  Diagnosis Date   ADHD (attention deficit hyperactivity disorder)    Apnea, sleep 06/30/2015   Arthritis    Asthma    Asthma with acute exacerbation 06/30/2015   Benign neoplasm of ascending colon    Bipolar 1 disorder (HCC)    Chronic pain    COPD, moderate (HCC) 11/18/2013   Depression    DOE (dyspnea on exertion)    Fall 07/04/2022   GERD (gastroesophageal reflux disease) 08/30/2015   Gout 08/16/2014   History of acute myocardial infarction 06/30/2015   History of panic attacks    HPV (human papilloma virus) infection 07/17/2013   Hyperlipidemia    Left knee pain 07/06/2022   Low HDL (under 40) 08/30/2015   MI (myocardial infarction) (HCC) 2018   MRSA infection 2023   groin abscess   Nose colonized with MRSA 03/21/2022   a.) PCR (+) prior to LEFT THA   Osteoporosis    Parkinson disease (HCC)    Polyp of sigmoid colon    Pre-diabetes    Prediabetes 05/20/2016   PTSD (post-traumatic stress disorder)    Restless leg syndrome    Rhabdomyolysis    Sleep apnea 06/30/2015   Stage 3 severe COPD by GOLD classification (HCC) 11/18/2013   Stroke (HCC) 2018   right arm weakness   Traumatic rhabdomyolysis 07/04/2022   Tremors of nervous system    hands   Uncomplicated asthma 06/30/2015   Varicella 02/25/2017  Chronic hyponatremia Long history of smoking, COPD, bipolar, PTSD Last seen by cardiology November 2016 for chest pain Who presents by referral from Mliss Gareth for chronic CHF  Hospitalized from November 14 to January 13, 2024, for acute on chronic respiratory failure - Diagnosed with COPD exacerbation during hospitalization At discharge started on torsemide  20 daily In the hospital end of December 2025  with discharge February 27, 2024 COPD exacerbation, influenza positive   PMH:   has a past medical history of ADHD (attention deficit hyperactivity disorder), Apnea, sleep (06/30/2015), Arthritis, Asthma, Asthma with acute exacerbation (06/30/2015), Benign neoplasm of ascending colon, Bipolar 1 disorder (HCC), Chronic pain, COPD, moderate (HCC) (11/18/2013), Depression, DOE (dyspnea on exertion), Fall (07/04/2022), GERD (gastroesophageal reflux disease) (08/30/2015), Gout (08/16/2014), History of acute myocardial infarction (06/30/2015), History of panic attacks, HPV (human papilloma virus) infection (07/17/2013), Hyperlipidemia, Left knee pain (07/06/2022), Low HDL (under 40) (08/30/2015), MI (myocardial infarction) (HCC) (2018), MRSA infection (2023), Nose colonized with MRSA (03/21/2022), Osteoporosis, Parkinson disease (HCC), Polyp of sigmoid colon, Pre-diabetes, Prediabetes (05/20/2016), PTSD (post-traumatic stress disorder), Restless leg syndrome, Rhabdomyolysis, Sleep apnea (06/30/2015), Stage 3 severe COPD by GOLD classification (HCC) (11/18/2013), Stroke (HCC) (2018), Traumatic rhabdomyolysis (07/04/2022), Tremors of nervous system, Uncomplicated asthma (06/30/2015), and Varicella (02/25/2017).   PSH:    Past Surgical History:  Procedure Laterality Date   BACK SURGERY     lumbar   CATARACT EXTRACTION W/PHACO Right 01/01/2019   Procedure: CATARACT EXTRACTION PHACO AND INTRAOCULAR LENS PLACEMENT (IOC) right vision blue;  Surgeon: Ferol Rogue, MD;  Location: [facility] ORS;  Service: Ophthalmology;  Laterality: Right;  US  00:39.4 CDE 5.49 Fluid Pack lot # [id number]   CATARACT EXTRACTION W/PHACO Left 01/29/2019   Procedure: CATARACT EXTRACTION PHACO AND INTRAOCULAR LENS PLACEMENT (  IOC) LEFT Vision Blue;  Surgeon: Ferol Rogue, MD;  Location: [facility] ORS;  Service: Ophthalmology;  Laterality: Left;  US  00:51.1 CDE 4.38 Fluid Pack Lot # [id number]   COLONOSCOPY WITH PROPOFOL  N/A 08/07/2017   Procedure:  COLONOSCOPY WITH PROPOFOL ;  Surgeon: Janalyn Keene NOVAK, MD;  Location: [facility] ENDOSCOPY;  Service: Endoscopy;  Laterality: N/A;   HAND SURGERY Left    fractured with pins   REPLACEMENT TOTAL KNEE Left 2016   SPINAL CORD STIMULATOR INSERTION  2014   SPINAL CORD STIMULATOR REMOVAL  2014   TOTAL HIP ARTHROPLASTY Left 10/09/2022   Procedure: TOTAL HIP ARTHROPLASTY ANTERIOR APPROACH;  Surgeon: Ernie Cough, MD;  Location: [facility];  Service: Orthopedics;  Laterality: Left;   TUBAL LIGATION      Current Outpatient Medications  Medication Sig Dispense Refill   albuterol  (VENTOLIN  HFA) 108 (90 Base) MCG/ACT inhaler Inhale 2 puffs into the lungs every 6 (six) hours as needed for wheezing or shortness of breath. 8.5 g 0   ALPRAZolam  (XANAX ) 0.5 MG tablet Take 1 tablet (0.5 mg total) by mouth 2 (two) times daily. 10 tablet 0   cyanocobalamin  (VITAMIN B12) 1000 MCG tablet TAKE 1 TABLET (1,000 MCG TOTAL) BY MOUTH DAILY. 90 tablet 1   divalproex  (DEPAKOTE ) 250 MG DR tablet Take 1 tablet (250 mg total) by mouth 2 (two) times daily. 60 tablet 2   doxycycline  (VIBRA -TABS) 100 MG tablet Take 1 tablet (100 mg total) by mouth every 12 (twelve) hours. 5 tablet 0   escitalopram  (LEXAPRO ) 20 MG tablet Take 20 mg by mouth daily.     ipratropium-albuterol  (DUONEB) 0.5-2.5 (3) MG/3ML SOLN TAKE 3 MLS BY NEBULIZATION 3 TIMES DAILY AS NEEDED 180 mL 1   magnesium  oxide (MAG-OX) 400 (240 Mg) MG tablet TAKE ONE TABLET (400 MG TOTAL) BY MOUTH DAILY. 30 tablet 1   miconazole  (MICOTIN) 2 % powder Apply topically 2 (two) times daily as needed for itching (Candidal intertrigo). 85 g 2   mometasone -formoterol  (DULERA ) 100-5 MCG/ACT AERO Inhale 2 puffs into the lungs 2 (two) times daily. (Patient not taking: Reported on 02/25/2024) 3 each 0   Multiple Vitamins-Minerals (MULTIVITAMIN WITH MINERALS) tablet Take 1 tablet by mouth daily.     nystatin  (MYCOSTATIN /NYSTOP ) powder Apply 1 Application topically 3 (three) times daily as  needed (yeast/fungal rashes or intertrigo). 60 g 2   predniSONE  (DELTASONE ) 20 MG tablet Take 2 tablets (40 mg total) by mouth daily with breakfast for 3 days. 3 tablet 0   primidone  (MYSOLINE ) 50 MG tablet Take 25 mg by mouth in the morning and at bedtime.     promethazine  (PHENERGAN ) 25 MG tablet Take 25 mg by mouth every 6 (six) hours as needed for nausea or vomiting.     rOPINIRole  (REQUIP  XL) 4 MG 24 hr tablet Take 4 mg by mouth at bedtime.     Tiotropium Bromide  (SPIRIVA  RESPIMAT) 2.5 MCG/ACT AERS INHALE 2 PUFFS INTO THE LUNGS DAILY 4 g 0   tiZANidine  (ZANAFLEX ) 2 MG tablet TAKE ONE (1) TO TWO (2) TABLETS (2-4 MG TOTAL) BY MOUTH EVERY EIGHT HOURS AS NEEDED FOR MUSCLE SPASMS. 30 tablet 1   torsemide  (DEMADEX ) 20 MG tablet Take 1 tablet (20 mg total) by mouth daily. 90 tablet 1   No current facility-administered medications for this visit.     Allergies:   Glycopyrronium, Chantix  [varenicline ], and Glycopyrrolate    Social History:  The patient  reports that she has been smoking cigarettes. She started smoking about 37 years  ago. She has a 18 pack-year smoking history. She has been exposed to tobacco smoke. She has never used smokeless tobacco. She reports current drug use. Drug: Marijuana. She reports that she does not drink alcohol.   Family History:   family history includes ADD / ADHD in her sister, sister, son, son, and son; Alcohol abuse in her brother; Bipolar disorder in her sister and sister; Dementia in her maternal grandmother; Depression in her son; Diabetes in her mother; Emphysema in her maternal grandfather; Heart disease in her mother; Hernia in her mother; OCD in her mother; Paranoid behavior in her sister; Parkinson's disease in her father; Schizophrenia in her sister.    Review of Systems: ROS   PHYSICAL EXAM: VS:  LMP  (LMP Unknown)  , BMI There is no height or weight on file to calculate BMI. GEN: Well nourished, well developed, in no acute distress HEENT:  normal Neck: no JVD, carotid bruits, or masses Cardiac: RRR; no murmurs, rubs, or gallops,no edema  Respiratory:  clear to auscultation bilaterally, normal work of breathing GI: soft, nontender, nondistended, + BS MS: no deformity or atrophy Skin: warm and dry, no rash Neuro:  Strength and sensation are intact Psych: euthymic mood, full affect    Recent Labs: 01/13/2024: Magnesium  1.9 01/22/2024: Brain Natriuretic Peptide 22 02/25/2024: ALT 12; BUN 7; Creatinine, Ser 0.84; Hemoglobin 13.5; Platelets 192; Potassium 3.6; Pro Brain Natriuretic Peptide 207.0; Sodium 130 02/26/2024: TSH 0.687    Lipid Panel Lab Results  Component Value Date   CHOL 142 01/22/2024   HDL 56 01/22/2024   LDLCALC 70 01/22/2024   TRIG 82 01/22/2024      Wt Readings from Last 3 Encounters:  02/26/24 237 lb 14 oz (107.9 kg)  01/22/24 238 lb (108 kg)  01/08/24 235 lb (106.6 kg)       ASSESSMENT AND PLAN:  Problem List Items Addressed This Visit   None    Disposition:   F/U  12 months   Total encounter time more than 30 minutes  Greater than 50% was spent in counseling and coordination of care with the patient    Signed, Velinda Lunger, M.D., Ph.D. [facility] [place], [place] [phone]

## 2024-03-02 ENCOUNTER — Telehealth: Payer: Self-pay

## 2024-03-02 NOTE — Transitions of Care (Post Inpatient/ED Visit) (Signed)
 "  03/02/2024  Name: Nicole Austin MRN: [id number] DOB: 11/23/1960  Today's TOC FU Call Status: Today's TOC FU Call Status:: Successful TOC FU Call Completed TOC FU Call Complete Date: 03/02/24  Patient's Name and Date of Birth confirmed. DOB, Name  Transition Care Management Follow-up Telephone Call Date of Discharge: 02/27/24 Discharge Facility: [facility]) Type of Discharge: Inpatient Admission Primary Inpatient Discharge Diagnosis:: COPD Exacerbation; Influenza A How have you been since you were released from the hospital?: Better Any questions or concerns?: Yes Patient Questions/Concerns:: Having sinus pressure; recently completed discharge medications Patient Questions/Concerns Addressed: Provided Patient Educational Materials  Items Reviewed: Did you receive and understand the discharge instructions provided?: Yes Medications obtained,verified, and reconciled?: Yes (Medications Reviewed) Any new allergies since your discharge?: No Dietary orders reviewed?: NA Do you have support at home?: Yes  Medications Reviewed Today: Medications Reviewed Today     Reviewed by Lavelle Charmaine NOVAK, LPN (Licensed Practical Nurse) on 03/02/24 at 1457  Med List Status: <None>   Medication Order Taking? Sig Documenting Provider Last Dose Status Informant  albuterol  (VENTOLIN  HFA) 108 (90 Base) MCG/ACT inhaler [id number] No Inhale 2 puffs into the lungs every 6 (six) hours as needed for wheezing or shortness of breath. Bernardo Fend, DO 02/24/2024 Active Self  ALPRAZolam  (XANAX ) 0.5 MG tablet [id number] No Take 1 tablet (0.5 mg total) by mouth 2 (two) times daily. Josette Ade, MD 02/24/2024 Active Self  cyanocobalamin  (VITAMIN B12) 1000 MCG tablet [id number] No TAKE 1 TABLET (1,000 MCG TOTAL) BY MOUTH DAILY. Gareth Mliss FALCON, FNP 02/24/2024 Active Self  divalproex  (DEPAKOTE ) 250 MG DR tablet [id number] No Take 1 tablet (250 mg total) by mouth 2 (two) times  daily. Mohammed Goldstein, MD 02/24/2024 Active Self  doxycycline  (VIBRA -TABS) 100 MG tablet [id number]  Take 1 tablet (100 mg total) by mouth every 12 (twelve) hours. Wouk, Devaughn Sayres, MD  Active   escitalopram  (LEXAPRO ) 20 MG tablet [id number] No Take 20 mg by mouth daily. [provider] 02/24/2024 Active Self  ipratropium-albuterol  (DUONEB) 0.5-2.5 (3) MG/3ML SOLN [id number] No TAKE 3 MLS BY NEBULIZATION 3 TIMES DAILY AS NEEDED Pender, Julie F, FNP 02/24/2024 Active Self  magnesium  oxide (MAG-OX) 400 (240 Mg) MG tablet [id number] No TAKE ONE TABLET (400 MG TOTAL) BY MOUTH DAILY. Leavy Mole, PA-C 02/24/2024 Active Self  miconazole  (MICOTIN) 2 % powder [id number] No Apply topically 2 (two) times daily as needed for itching (Candidal intertrigo). Leavy Mole, PA-C Unknown Active Self  mometasone -formoterol  (DULERA ) 100-5 MCG/ACT AERO [id number] No Inhale 2 puffs into the lungs 2 (two) times daily.  Patient not taking: Reported on 02/25/2024   [place], Eric J, DO Not Taking Active Self           Med Note NORVEL KIRSCH   Tue Aug 06, 2023  1:53 PM)    Multiple Vitamins-Minerals (MULTIVITAMIN WITH MINERALS) tablet [id number] No Take 1 tablet by mouth daily. [provider] 02/24/2024 Active Self  nystatin  (MYCOSTATIN /NYSTOP ) powder [id number]  Apply 1 Application topically 3 (three) times daily as needed (yeast/fungal rashes or intertrigo). Wouk, Devaughn Sayres, MD  Active   predniSONE  (DELTASONE ) 20 MG tablet [id number]  Take 2 tablets (40 mg total) by mouth daily with breakfast for 3 days. Wouk, Devaughn Sayres, MD  Active   primidone  (MYSOLINE ) 50 MG tablet [id number] No Take 25 mg by mouth in the morning and at bedtime. [provider] 02/24/2024 Active Self  promethazine  (PHENERGAN ) 25 MG tablet [id number] No Take 25 mg by  mouth every 6 (six) hours as needed for nausea or vomiting. [provider] 02/24/2024 Active Self  rOPINIRole  (REQUIP  XL) 4 MG 24 hr tablet [id number] No  Take 4 mg by mouth at bedtime. [provider] 02/24/2024 Active Self  Tiotropium Bromide  (SPIRIVA  RESPIMAT) 2.5 MCG/ACT AERS [id number] No INHALE 2 PUFFS INTO THE LUNGS DAILY Pender, Julie F, FNP 02/24/2024 Active Self  tiZANidine  (ZANAFLEX ) 2 MG tablet [id number] No TAKE ONE (1) TO TWO (2) TABLETS (2-4 MG TOTAL) BY MOUTH EVERY EIGHT HOURS AS NEEDED FOR MUSCLE SPASMS. Gareth Mliss FALCON, FNP Unknown Active Self  torsemide  (DEMADEX ) 20 MG tablet [id number] No Take 1 tablet (20 mg total) by mouth daily. Gareth Mliss FALCON, FNP Past Month Active Self            Home Care and Equipment/Supplies: Were Home Health Services Ordered?: NA Any new equipment or medical supplies ordered?: NA  Functional Questionnaire: Do you need assistance with bathing/showering or dressing?: No Do you need assistance with meal preparation?: No Do you need assistance with eating?: No Do you have difficulty maintaining continence: No Do you need assistance with getting out of bed/getting out of a chair/moving?: No Do you have difficulty managing or taking your medications?: No  Follow up appointments reviewed: PCP Follow-up appointment confirmed?: Yes Date of PCP follow-up appointment?: 03/04/24 Follow-up Provider: Mliss Gareth FNP Specialist Hospital Follow-up appointment confirmed?: NA Do you need transportation to your follow-up appointment?: No Do you understand care options if your condition(s) worsen?: Yes-patient verbalized understanding    SIGNATURE Charmaine Bloodgood, LPN [facility] Advisor [facility] You Are. We Are. [facility] Direct Dial [phone]  "

## 2024-03-03 ENCOUNTER — Ambulatory Visit: Payer: Self-pay

## 2024-03-03 ENCOUNTER — Ambulatory Visit: Attending: Cardiovascular Disease | Admitting: Cardiovascular Disease

## 2024-03-03 DIAGNOSIS — J432 Centrilobular emphysema: Secondary | ICD-10-CM

## 2024-03-03 DIAGNOSIS — I5032 Chronic diastolic (congestive) heart failure: Secondary | ICD-10-CM

## 2024-03-03 DIAGNOSIS — E782 Mixed hyperlipidemia: Secondary | ICD-10-CM

## 2024-03-03 DIAGNOSIS — Z6841 Body Mass Index (BMI) 40.0 and over, adult: Secondary | ICD-10-CM

## 2024-03-03 DIAGNOSIS — J9611 Chronic respiratory failure with hypoxia: Secondary | ICD-10-CM

## 2024-03-03 NOTE — Telephone Encounter (Signed)
"   °  °  Attempt # 3 to reach patient to triage symptoms. Unable to leave VM to call back- VM FULL      Copied from CRM [id number]. Topic: Clinical - Medical Advice >> Mar 03, 2024 12:25 PM Hadassah PARAS wrote: Reason for CRM: Pt is req medication for congestion and sinus infection. Pt is having trouble breathing through sinuses. Please advise pt on #[phone]       Reason for Disposition  Third attempt to contact caller AND no contact made. Phone number verified.  Protocols used: No Contact or Duplicate Contact Call-A-AH  "

## 2024-03-03 NOTE — Telephone Encounter (Signed)
 Attempted to contact pt but NA and VM full, unable to LM. Attempt #2  Copied from CRM [id number]. Topic: Clinical - Medical Advice >> Mar 03, 2024 12:25 PM Nicole Austin wrote: Reason for CRM: Pt is req medication for congestion and sinus infection. Pt is having trouble breathing through sinuses. Please advise pt on #[phone]

## 2024-03-03 NOTE — Telephone Encounter (Signed)
 Attempt # 1 to reach patient to triage symptoms. Unable to leave VM to call back- VM FULL    Copied from CRM [id number]. Topic: Clinical - Medical Advice >> Mar 03, 2024 12:25 PM Nicole Austin wrote: Reason for CRM: Pt is req medication for congestion and sinus infection. Pt is having trouble breathing through sinuses. Please advise pt on #[phone]

## 2024-03-04 ENCOUNTER — Inpatient Hospital Stay: Admitting: Nurse Practitioner

## 2024-03-04 ENCOUNTER — Ambulatory Visit: Payer: Self-pay

## 2024-03-04 NOTE — Telephone Encounter (Signed)
 FYI Only or Action Required?: Action required by provider: update on patient condition and Appt at 2pm today needing to be changed to Video visit for acute sinus congestion/pain/ cough.  Patient was last seen in primary care on 01/22/2024 by Gareth Mliss FALCON, FNP.  Called Nurse Triage reporting Sinusitis.  Symptoms began several days ago.  Interventions attempted: OTC medications: tylenol .  Symptoms are: gradually worsening.  Triage Disposition: See HCP Within 4 Hours (Or PCP Triage)  Patient/caregiver understands and will follow disposition?: Yes  Copied from CRM [id number]. Topic: Clinical - Red Word Triage >> Mar 04, 2024 11:39 AM Nicole Austin wrote: Red Word that prompted transfer to Nurse Triage: Patient stated she is stopped up. Sinus pressure and congestion, stuck in chest. Head feels like a ballon. Headaches  Would like to reschedule hospital follow up appt that is scheduled for today. Reason for Disposition  [1] SEVERE sinus pain (e.g., excruciating) AND [2] not improved 2 hours after pain medicine  Answer Assessment - Initial Assessment Questions Patient with recent ED visit for COPD exacerbation- supposed to have a HFU appt today but has been feeling bad for 2-3days now.   Sinus congestion, headache/sinus pain, hoarse voice, dry hacky cough.  Pain 10/10. Throat scratchy but not sore  On home O2- has to pace herself slowly- has SOB and slightly worsened due to nose stopped up. Denies chest pain, dizziness, fever,earache, chest congestion.   Patient needing to change OV to Video Visit at 2pm! HFU to acute visit for sinus congestion/pain.    1. LOCATION: Where does it hurt?      Head pressure behind her sinuses  2. ONSET: When did the sinus pain start?  (e.g., hours, days)      2 days ago  3. SEVERITY: How bad is the pain?   (Scale 0-10; or none, mild, moderate or severe)     10/10 4. RECURRENT SYMPTOM: Have you ever had sinus problems before? If Yes, ask: When  was the last time? and What happened that time?      Denies  5. NASAL CONGESTION: Is the nose blocked? If Yes, ask: Can you open it or must you breathe through your mouth?     Bilateral congestion  6. NASAL DISCHARGE: Do you have discharge from your nose? If so ask, What color?     denies 7. FEVER: Do you have a fever? If Yes, ask: What is it, how was it measured, and when did it start?      denies 8. OTHER SYMPTOMS: Do you have any other symptoms? (e.g., sore throat, cough, earache, difficulty breathing)     Scratchy throat, dry hacky cough.  Protocols used: Sinus Pain or Congestion-A-AH

## 2024-03-05 ENCOUNTER — Ambulatory Visit: Admitting: Podiatry

## 2024-03-05 DIAGNOSIS — Z538 Procedure and treatment not carried out for other reasons: Secondary | ICD-10-CM

## 2024-03-05 NOTE — Progress Notes (Signed)
 1. Appointment canceled by hospital    Patient recently discharged from hospital.

## 2024-03-05 NOTE — Patient Outreach (Signed)
 RNCM - chart review noted patient sick, cancelled Hospital follow up, called patient, reported sinus issues only, no SOB and received OTC med recommended by pharmacist. Patient will call PCP office to reschedule follow up and moved [facility] appointment sooner.

## 2024-03-09 ENCOUNTER — Encounter: Payer: Self-pay | Admitting: *Deleted

## 2024-03-11 ENCOUNTER — Other Ambulatory Visit: Payer: Self-pay | Admitting: Nurse Practitioner

## 2024-03-11 ENCOUNTER — Encounter: Payer: Self-pay | Admitting: Nurse Practitioner

## 2024-03-11 ENCOUNTER — Ambulatory Visit (INDEPENDENT_AMBULATORY_CARE_PROVIDER_SITE_OTHER): Admitting: Nurse Practitioner

## 2024-03-11 VITALS — BP 118/80 | HR 77 | Temp 97.0°F | Ht 61.0 in | Wt 257.0 lb

## 2024-03-11 DIAGNOSIS — B9789 Other viral agents as the cause of diseases classified elsewhere: Secondary | ICD-10-CM

## 2024-03-11 DIAGNOSIS — J329 Chronic sinusitis, unspecified: Secondary | ICD-10-CM

## 2024-03-11 DIAGNOSIS — J441 Chronic obstructive pulmonary disease with (acute) exacerbation: Secondary | ICD-10-CM

## 2024-03-11 DIAGNOSIS — J101 Influenza due to other identified influenza virus with other respiratory manifestations: Secondary | ICD-10-CM | POA: Diagnosis not present

## 2024-03-11 DIAGNOSIS — M5442 Lumbago with sciatica, left side: Secondary | ICD-10-CM

## 2024-03-11 DIAGNOSIS — Z09 Encounter for follow-up examination after completed treatment for conditions other than malignant neoplasm: Secondary | ICD-10-CM

## 2024-03-11 MED ORDER — DOXYCYCLINE HYCLATE 100 MG PO TABS: 100.0000 mg | ORAL_TABLET | Freq: Two times a day (BID) | ORAL | 0 refills | Status: AC

## 2024-03-11 MED ORDER — PREDNISONE 20 MG PO TABS: 20.0000 mg | ORAL_TABLET | Freq: Every day | ORAL | 0 refills | Status: AC

## 2024-03-11 MED ORDER — FLUTICASONE PROPIONATE 50 MCG/ACT NA SUSP: 2.0000 | Freq: Every day | NASAL | 6 refills | Status: AC

## 2024-03-11 MED ORDER — CETIRIZINE HCL 10 MG PO TABS: 10.0000 mg | ORAL_TABLET | Freq: Every day | ORAL | 11 refills | Status: AC

## 2024-03-11 NOTE — Progress Notes (Signed)
 "  BP 118/80   Pulse 77   Temp (!) 97 F (36.1 C)   Ht 5' 1 (1.549 m)   Wt 257 lb (116.6 kg)   LMP  (LMP Unknown)   SpO2 95%   BMI 48.56 kg/m    Subjective:    Patient ID: Nicole Austin, female    DOB: January 10, 1961, 64 y.o.   MRN: [id number]  HPI: Nicole Austin is a 64 y.o. female  Chief Complaint  Patient presents with   Medical Management of Chronic Issues   Discussed the use of AI scribe software for clinical note transcription with the patient, who gave verbal consent to proceed.  History of Present Illness Nicole Austin is a 64 year old female with COPD who presents for hospital follow-up after a recent exacerbation.  Respiratory symptoms and copd exacerbation - Hospitalized from February 25, 2024 to February 27, 2024 for COPD exacerbation triggered by influenza A (confirmed by positive respiratory panel) - Symptoms during hospitalization included shortness of breath, sore throat, runny nose, dry cough for four to five days, increased wheezing, and exertional shortness of breath - No chest pain, fever, or chills - Uses two liters of oxygen  as needed - Chest x-ray during hospitalization showed no active disease  Sinus congestion - Persistent sinus issues since hospital discharge - Describes ongoing sensation of head congestion: 'My head is still not clear' - No relief with Mucinex  or over-the-counter medications despite taking 24 continuous pills since Friday  Medication changes and recent treatment - Diflucan , Elepsia, naproxen , and Bactrim  discontinued during hospitalization - Completed prednisone  20 mg daily for three days and doxycycline  100 mg twice daily for three days  Laboratory and imaging findings - Normal thyroid  and troponin levels during hospitalization - ProBNP within normal range - Kidney function, previously worsened, returned to normal range during hospitalization - Potassium levels, previously low, normalized during  hospitalization  Weight gain and appetite changes - Significant weight gain since hospital discharge - Increased appetite after not eating for two days during hospital stay - Continued increased food intake since returning home     Admit date: 02/25/2024 Discharge date: 02/27/2024   Time spent: 35 minutes   Recommendations for Outpatient Follow-up:  Pcp f/u 1 week        Discharge Diagnoses:  Principal Problem:   COPD exacerbation (HCC) Active Problems:   COPD (chronic obstructive pulmonary disease) (HCC)   Bipolar disorder with depression (HCC)   Class 3 severe obesity with serious comorbidity and body mass index (BMI) of 45.0 to 49.9 in adult [facility])   Sleep apnea   Chronic prescription benzodiazepine use   CHF (congestive heart failure) (HCC)   Influenza A     Discharge Condition: stable   Diet recommendation: heart healthy       Filed Weights    02/25/24 0958 02/26/24 0516  Weight: 110.9 kg 107.9 kg      History of present illness:  From admission h an dp Nicole Austin is a 64 y.o. female with medical history significant of COPD Gold stage III, chronic hypoxic respiratory failure on 2 L as needed, HTN/chronic HFpEF, bipolar disorder, morbid obesity, presented with worsening of cough wheezing shortness of breath.   Symptoms started 4-5 days ago open patient started to have sore throat runny nose with dry cough density started to have wheezing and increasing exertional dyspnea.  Denied any chest pain no fever or chills.  Symptoms became worse yesterday, despite using around-the-clock breathing treatments  she continued to experience worsening shortness of breath, she felt out of breath at rest this morning and came to ED.  She has not received flu shot this year.   Hospital Course:    Patient presents with uri symptoms. Found to have influenza a leading to copd exacerbation. Out of window for tamiflu. Treated copd exacerbation with steroids and antibiotics.  Symptomatically improved, breathing comfortably on home 2 liters. PT advising home health which we ordered. Other chronic problems stable. Will complete a course of steroids and abx. Advise pcp f/u one week.   Medication List       STOP taking these medications     fluconazole  150 MG tablet Commonly known as: DIFLUCAN     Incruse Ellipta  62.5 MCG/ACT Aepb Generic drug: umeclidinium bromide     Iron  (Ferrous Sulfate ) 325 (65 Fe) MG Tabs    naproxen  500 MG tablet Commonly known as: Naprosyn     psyllium 58.6 % powder Commonly known as: METAMUCIL    sulfamethoxazole -trimethoprim  800-160 MG tablet Commonly known as: BACTRIM  DS           TAKE these medications     rOPINIRole  4 MG 24 hr tablet Commonly known as: REQUIP  XL Take 4 mg by mouth at bedtime. The timing of this medication is very important.    albuterol  108 (90 Base) MCG/ACT inhaler Commonly known as: VENTOLIN  HFA Inhale 2 puffs into the lungs every 6 (six) hours as needed for wheezing or shortness of breath.    ALPRAZolam  0.5 MG tablet Commonly known as: XANAX  Take 1 tablet (0.5 mg total) by mouth 2 (two) times daily.    cyanocobalamin  1000 MCG tablet Commonly known as: VITAMIN B12 TAKE 1 TABLET (1,000 MCG TOTAL) BY MOUTH DAILY.    divalproex  250 MG DR tablet Commonly known as: DEPAKOTE  Take 1 tablet (250 mg total) by mouth 2 (two) times daily.    doxycycline  100 MG tablet Commonly known as: VIBRA -TABS Take 1 tablet (100 mg total) by mouth every 12 (twelve) hours.    escitalopram  20 MG tablet Commonly known as: LEXAPRO  Take 20 mg by mouth daily.    ipratropium-albuterol  0.5-2.5 (3) MG/3ML Soln Commonly known as: DUONEB TAKE 3 MLS BY NEBULIZATION 3 TIMES DAILY AS NEEDED    magnesium  oxide 400 (240 Mg) MG tablet Commonly known as: MAG-OX TAKE ONE TABLET (400 MG TOTAL) BY MOUTH DAILY.    miconazole  2 % powder Commonly known as: MICOTIN Apply topically 2 (two) times daily as needed for itching  (Candidal intertrigo).    mometasone -formoterol  100-5 MCG/ACT Aero Commonly known as: DULERA  Inhale 2 puffs into the lungs 2 (two) times daily.    multivitamin with minerals tablet Take 1 tablet by mouth daily.    nystatin  powder Commonly known as: MYCOSTATIN /NYSTOP  Apply 1 Application topically 3 (three) times daily as needed (yeast/fungal rashes or intertrigo).    predniSONE  20 MG tablet Commonly known as: DELTASONE  Take 2 tablets (40 mg total) by mouth daily with breakfast for 3 days. Start taking on: February 28, 2024    primidone  50 MG tablet Commonly known as: MYSOLINE  Take 25 mg by mouth in the morning and at bedtime.    promethazine  25 MG tablet Commonly known as: PHENERGAN  Take 25 mg by mouth every 6 (six) hours as needed for nausea or vomiting.    Spiriva  Respimat 2.5 MCG/ACT Aers Generic drug: Tiotropium Bromide  INHALE 2 PUFFS INTO THE LUNGS DAILY    tiZANidine  2 MG tablet Commonly known as: ZANAFLEX  TAKE ONE (1)  TO TWO (2) TABLETS (2-4 MG TOTAL) BY MOUTH EVERY EIGHT HOURS AS NEEDED FOR MUSCLE SPASMS.    torsemide  20 MG tablet Commonly known as: DEMADEX  Take 1 tablet (20 mg total) by mouth daily.       EXAM: PORTABLE CHEST 1 VIEW   COMPARISON:  January 10, 2024   FINDINGS: The heart size and mediastinal contours are within normal limits. Both lungs are clear. The visualized skeletal structures are unremarkable.   IMPRESSION: No active disease.     01/22/2024    1:23 PM 01/22/2024    1:22 PM 11/15/2023   10:30 AM  Depression screen PHQ 2/9  Decreased Interest 0 0 2  Down, Depressed, Hopeless 0 0 1  PHQ - 2 Score 0 0 3  Altered sleeping 0    Tired, decreased energy 0    Change in appetite 0    Feeling bad or failure about yourself  0    Trouble concentrating 0    Moving slowly or fidgety/restless 0    Suicidal thoughts 0    PHQ-9 Score 0      Relevant past medical, surgical, family and social history reviewed and updated as indicated.  Interim medical history since our last visit reviewed. Allergies and medications reviewed and updated.  Review of Systems  Ten systems reviewed and is negative except as mentioned in HPI      Objective:      BP 118/80   Pulse 77   Temp (!) 97 F (36.1 C)   Ht 5' 1 (1.549 m)   Wt 257 lb (116.6 kg)   LMP  (LMP Unknown)   SpO2 95%   BMI 48.56 kg/m    Wt Readings from Last 3 Encounters:  03/11/24 257 lb (116.6 kg)  02/26/24 237 lb 14 oz (107.9 kg)  01/22/24 238 lb (108 kg)    Physical Exam GENERAL: Alert, cooperative, well developed, no acute distress. HEENT: Normocephalic, normal oropharynx, moist mucous membranes, nose and throat normal. CHEST: Mild wheezing and congestion with some rhonchi. CARDIOVASCULAR: Normal heart rate and rhythm, S1 and S2 normal without murmurs. ABDOMEN: Soft, non-tender, non-distended, without organomegaly, normal bowel sounds. EXTREMITIES: No cyanosis or edema. NEUROLOGICAL: Cranial nerves grossly intact, moves all extremities without gross motor or sensory deficit.  Results for orders placed or performed during the hospital encounter of 02/25/24  Resp panel by RT-PCR (RSV, Flu A&B, Covid) Anterior Nasal Swab   Collection Time: 02/25/24 10:23 AM   Specimen: Anterior Nasal Swab  Result Value Ref Range   SARS Coronavirus 2 by RT PCR NEGATIVE NEGATIVE   Influenza A by PCR POSITIVE (A) NEGATIVE   Influenza B by PCR NEGATIVE NEGATIVE   Resp Syncytial Virus by PCR NEGATIVE NEGATIVE  CBC   Collection Time: 02/25/24 10:23 AM  Result Value Ref Range   WBC 5.5 4.0 - 10.5 K/uL   RBC 4.58 3.87 - 5.11 MIL/uL   Hemoglobin 13.5 12.0 - 15.0 g/dL   HCT 59.6 63.9 - 53.9 %   MCV 88.0 80.0 - 100.0 fL   MCH 29.5 26.0 - 34.0 pg   MCHC 33.5 30.0 - 36.0 g/dL   RDW 84.2 (H) 88.4 - 84.4 %   Platelets 192 150 - 400 K/uL   nRBC 0.0 0.0 - 0.2 %  Comprehensive metabolic panel   Collection Time: 02/25/24 10:23 AM  Result Value Ref Range   Sodium 130 (L) 135 -  145 mmol/L   Potassium 3.6 3.5 - 5.1 mmol/L  Chloride 90 (L) 98 - 111 mmol/L   CO2 28 22 - 32 mmol/L   Glucose, Bld 148 (H) 70 - 99 mg/dL   BUN 7 (L) 8 - 23 mg/dL   Creatinine, Ser 9.15 0.44 - 1.00 mg/dL   Calcium  9.3 8.9 - 10.3 mg/dL   Total Protein 7.1 6.5 - 8.1 g/dL   Albumin 4.0 3.5 - 5.0 g/dL   AST 21 15 - 41 U/L   ALT 12 0 - 44 U/L   Alkaline Phosphatase 80 38 - 126 U/L   Total Bilirubin 0.3 0.0 - 1.2 mg/dL   GFR, Estimated >39 >39 mL/min   Anion gap 12 5 - 15  Pro Brain natriuretic peptide   Collection Time: 02/25/24 10:23 AM  Result Value Ref Range   Pro Brain Natriuretic Peptide 207.0 <300.0 pg/mL  Blood gas, venous   Collection Time: 02/25/24 10:23 AM  Result Value Ref Range   pH, Ven 7.3 7.25 - 7.43   pCO2, Ven 60 44 - 60 mmHg   pO2, Ven 35 32 - 45 mmHg   Bicarbonate 29.5 (H) 20.0 - 28.0 mmol/L   Acid-Base Excess 1.6 0.0 - 2.0 mmol/L   O2 Saturation 50.9 %   Patient temperature 37.0    Collection site VENOUS   Troponin T, High Sensitivity   Collection Time: 02/25/24 10:23 AM  Result Value Ref Range   Troponin T High Sensitivity <15 0 - 19 ng/L  Troponin T, High Sensitivity   Collection Time: 02/25/24 12:04 PM  Result Value Ref Range   Troponin T High Sensitivity <15 0 - 19 ng/L  HIV Antibody (routine testing w rflx)   Collection Time: 02/25/24 11:52 PM  Result Value Ref Range   HIV Screen 4th Generation wRfx Non Reactive Non Reactive  TSH   Collection Time: 02/26/24 12:04 PM  Result Value Ref Range   TSH 0.687 0.350 - 4.500 uIU/mL  Sodium, urine, random   Collection Time: 02/26/24  2:00 PM  Result Value Ref Range   Sodium, Ur 84 mmol/L  Osmolality, urine   Collection Time: 02/26/24  2:00 PM  Result Value Ref Range   Osmolality, Ur 345 300 - 900 mOsm/kg   *Note: Due to a large number of results and/or encounters for the requested time period, some results have not been displayed. A complete set of results can be found in Results Review.           Assessment & Plan:   Problem List Items Addressed This Visit       Respiratory   COPD exacerbation (HCC)   Relevant Medications   fluticasone  (FLONASE ) 50 MCG/ACT nasal spray   cetirizine  (ZYRTEC ) 10 MG tablet   predniSONE  (DELTASONE ) 20 MG tablet   doxycycline  (VIBRA -TABS) 100 MG tablet   Influenza A   Other Visit Diagnoses       Hospital discharge follow-up    -  Primary     Viral sinusitis       Relevant Medications   fluticasone  (FLONASE ) 50 MCG/ACT nasal spray   cetirizine  (ZYRTEC ) 10 MG tablet   predniSONE  (DELTASONE ) 20 MG tablet   doxycycline  (VIBRA -TABS) 100 MG tablet        Assessment and Plan Assessment & Plan COPD exacerbation Recent exacerbation likely triggered by influenza A infection. Hospitalized from December 30th, 2025, to January 1st, 2026. Chest x-ray showed no active disease. Mild wheezing and congestion on lung examination. Completed a 3-day course of prednisone  and doxycycline . - Prescribed prednisone   for 5 days, to start the day after today. - Prescribed doxycycline  for 4 weeks, to be taken with food to prevent stomach upset.  Influenza A Confirmed influenza A infection contributing to COPD exacerbation. Symptoms included sore throat, runny nose, dry cough, and increased wheezing. Completed a 3-day course of doxycycline .  Viral sinusitis Persistent sinus congestion likely due to viral sinusitis following influenza A infection. Sinuses appear swollen, possibly due to recent illness. No signs of bacterial infection. Reports ongoing sinus congestion despite Mucinex  use. - Prescribed Flonase  nasal spray to reduce sinus swelling. - Prescribed an allergy medication such as Zyrtec  or Claritin to help dry up congestion.        Follow up plan: Return if symptoms worsen or fail to improve. "

## 2024-03-12 ENCOUNTER — Other Ambulatory Visit: Payer: Self-pay | Admitting: Nurse Practitioner

## 2024-03-12 ENCOUNTER — Ambulatory Visit: Payer: Self-pay

## 2024-03-12 ENCOUNTER — Telehealth: Payer: Self-pay

## 2024-03-12 DIAGNOSIS — B379 Candidiasis, unspecified: Secondary | ICD-10-CM

## 2024-03-12 MED ORDER — NYSTATIN 100000 UNIT/GM EX OINT: 1.0000 | TOPICAL_OINTMENT | Freq: Two times a day (BID) | CUTANEOUS | 0 refills | Status: AC

## 2024-03-12 NOTE — Telephone Encounter (Signed)
" °  FYI Only or Action Required?: Action required by provider: clinical question for provider and update on patient condition.  Patient was last seen in primary care on 03/11/2024 by Gareth Mliss FALCON, FNP.  Called Nurse Triage reporting New Med Request.    Triage Disposition: Call PCP Now  Patient/caregiver understands and will follow disposition?: No, wishes to speak with PCP   Copied from CRM #[id number]. Topic: Clinical - Medical Advice >> Mar 12, 2024  8:57 AM Olam RAMAN wrote: Reason for CRM: pt is retaining fluid and needs an rx for water  fluid in legs Reason for Disposition  [1] Caller requests to speak ONLY to PCP AND [2] URGENT question  Answer Assessment - Initial Assessment Questions 1. REASON FOR CALL or QUESTION: What is your reason for calling today? or How can I best    Patient seen in office on 1/14 for a hospital follow-up, she states she is having fluid retention BIL legs. She is requesting a dietetic be prescribed. Please advise.  Protocols used: PCP Call - No Triage-A-AH  "

## 2024-03-12 NOTE — Telephone Encounter (Signed)
 Pt.notified

## 2024-03-12 NOTE — Telephone Encounter (Signed)
 Copied from CRM #[id number]. Topic: Clinical - Medication Question >> Mar 12, 2024 10:47 AM Nicole Austin wrote: Reason for CRM: Patient was seen on 03/11/24 and was prescribed and antibiotic, doxycycline  (VIBRA -TABS) 100 MG tablet. States anytime she takes it will get a yeast infection, doesn't have any symptoms but is concerned and requesting to see if something can be prescribed so she can be ready if symptoms occur.  Patient can be reached at [phone]

## 2024-03-12 NOTE — Telephone Encounter (Signed)
 Copied from CRM #[id number]. Topic: Clinical - Refused Triage >> Mar 12, 2024  8:58 AM Olam RAMAN wrote: Patient/caller voiced complaints of  pt is retaining fluid and needs an rx for water  fluid in legs swelling and legs feel hard. Declined transfer to triage.

## 2024-03-12 NOTE — Telephone Encounter (Signed)
 Spoke with patient. Stated she was not taking torsemide . Advised that it was available at her pharmacy that PCP prescribed and provided number for [facility] heart failure clinic.

## 2024-03-13 ENCOUNTER — Telehealth: Payer: Self-pay

## 2024-03-13 NOTE — Telephone Encounter (Signed)
 Copied from CRM [id number]. Topic: Clinical - Home Health Verbal Orders >> Mar 13, 2024  4:19 PM [name] wrote: Caller/Agency: [facility] Number: [phone] Service Requested: Physical Therapy Frequency: 1w9 Any new concerns about the patient? No

## 2024-03-16 NOTE — Patient Outreach (Signed)
 RNCM called patient to reschedule tomorrow's appointmen tearlier due to cardiology appointment in afternoon - completed.

## 2024-03-17 ENCOUNTER — Telehealth

## 2024-03-17 ENCOUNTER — Other Ambulatory Visit

## 2024-03-17 ENCOUNTER — Ambulatory Visit

## 2024-03-17 VITALS — BP 122/70 | HR 72 | Ht 61.0 in | Wt 256.0 lb

## 2024-03-17 DIAGNOSIS — I5033 Acute on chronic diastolic (congestive) heart failure: Secondary | ICD-10-CM

## 2024-03-17 DIAGNOSIS — Z79899 Other long term (current) drug therapy: Secondary | ICD-10-CM

## 2024-03-17 DIAGNOSIS — R079 Chest pain, unspecified: Secondary | ICD-10-CM

## 2024-03-17 DIAGNOSIS — Z8673 Personal history of transient ischemic attack (TIA), and cerebral infarction without residual deficits: Secondary | ICD-10-CM

## 2024-03-17 MED ORDER — DAPAGLIFLOZIN PROPANEDIOL 10 MG PO TABS: 10.0000 mg | ORAL_TABLET | Freq: Every day | ORAL | 3 refills | Status: AC

## 2024-03-17 MED ORDER — ROSUVASTATIN CALCIUM 10 MG PO TABS: 10.0000 mg | ORAL_TABLET | Freq: Every day | ORAL | 3 refills | Status: AC

## 2024-03-17 MED ORDER — TORSEMIDE 40 MG PO TABS: 40.0000 mg | ORAL_TABLET | Freq: Every day | ORAL | 3 refills | Status: AC

## 2024-03-17 MED ORDER — ASPIRIN 81 MG PO TBEC: 81.0000 mg | DELAYED_RELEASE_TABLET | Freq: Every day | ORAL | Status: AC

## 2024-03-17 NOTE — Patient Instructions (Signed)
 Visit Information  Thank you for taking time to visit with me today. Please don't hesitate to contact me if I can be of assistance to you before our next scheduled appointment.  Your next care management appointment is by telephone on 04/14/2024  at 1:00 pm  Telephone follow-up in 1 month  Please call the care guide team at [phone] if you need to cancel, schedule, or reschedule an appointment.   Please call the Suicide and Crisis Lifeline: 988 call the [place]  National Suicide Prevention Lifeline: 872-652-4693 or TTY: 704-680-3169 TTY 817-626-9909) to talk to a trained counselor call 1-800-273-TALK (toll free, 24 hour hotline) go to [facility] [address], [place] [phone]) call 911 if you are experiencing a Mental Health or Behavioral Health Crisis or need someone to talk to.  Nestora Duos, MSN, RN [place]  [facility], [facility] RN Care Manager Direct Dial: [phone] Fax: [phone]   Heart Failure Action Plan A heart failure action plan helps you know what to do when you have symptoms of heart failure. Your action plan is a color-coded plan that lists the symptoms to watch for and indicates what actions to take. If you have symptoms in the green zone, you're doing well. If you have symptoms in the yellow zone, you're having problems. If you have symptoms in the red zone, you need medical care right away. Follow the plan that was created by you and your health care provider. Review your plan each time you visit your provider. Green zone These signs mean you're doing well and can continue what you're doing: You don't have new or worsening shortness of breath. You have very little swelling or no new swelling. Your weight is stable (no gain or loss). You have a normal activity level. You don't have chest pain or any other new symptoms. Yellow zone These signs and symptoms mean your condition  may be getting worse and you should make some changes: You have trouble breathing when you're active. You have swelling in your feet or legs or have discomfort in your belly. You gain 2-3 lb (0.9-1.4 kg) in 24 hours, or 5 lb (2.3 kg) in a week. This amount may be more or less depending on your condition. You get tired easily. You have trouble sleeping. You have a dry cough. If you have any of these symptoms: Contact your provider within the next day. Your provider may adjust your medicines. Red zone These signs and symptoms mean you should get medical help right away: You have trouble breathing when resting or cannot lie flat and you need to raise your head to help you breathe. You have a dry cough that's getting worse. You have swelling or pain in your feet or legs or discomfort in your belly that's getting worse. You suddenly gain more than 2-3 lb (0.9-1.4 kg) in 24 hours, or more than 5 lb (2.3 kg) in a week. This amount may be more or less depending on your condition. You have trouble staying awake or you feel confused. You don't have an appetite. You have worsening sadness or depression. These symptoms may be an emergency. Call 911 right away. Do not wait to see if the symptoms will go away. Do not drive yourself to the hospital. Follow these instructions at home: Take medicines only as told. Eat a heart-healthy diet. Work with a dietitian to create an eating plan that's best for you. Weigh yourself each day. Your target weight is __________ lb (  __________ kg). Call your provider if you gain more than __________ lb (__________ kg) in 24 hours, or more than __________ lb (__________ kg) in a week. Health care provider name: _____________________________________________________ Health care provider phone number: _____________________________________________________ Where to find more information American Heart Association: heart.org This information is not intended to replace advice  given to you by your health care provider. Make sure you discuss any questions you have with your health care provider. Document Revised: 09/27/2022 Document Reviewed: 09/27/2022 Elsevier Patient Education  2024 Elsevier Inc.   Heart Failure: What to Know  Heart failure means that your heart isn't able to pump blood the way it should. The heart might not be able to pump enough blood and oxygen  to your body tissues. Heart failure is usually a long-term condition. Be sure to take good care of yourself and follow your treatment plan. Different stages of heart failure have different treatment plans. The stages are: Stage A: At risk for heart failure. You don't have any symptoms, but you're at risk for getting heart failure. Stage B: Pre-heart failure. You don't have any symptoms, but your heart has changes that show heart failure. Stage C: Symptomatic heart failure. You have symptoms of heart failure, and your heart has changed in ways that show heart failure. Stage D: Advanced heart failure. You have symptoms that make it hard to live your daily life, and you often need to stay in the hospital because of heart failure. What are the causes? Heart failure may be caused by: High blood pressure. Coronary artery disease. This is when cholesterol and fat build up in the arteries. Heart attack. Heart valves that don't open and close properly. Damage to the heart muscle. An infection of the heart muscle. Lung disease. What increases the risk? Getting older. The risk of heart failure goes up as a person ages. Using tobacco or nicotine  products. Being overweight. Using alcohol or drugs. Having any of these conditions: Diabetes. Abnormal heart rhythms. Thyroid  problems. Chronic kidney disease. Having a family history of heart failure. Having taken medicines that can damage the heart. What are the signs or symptoms? Symptoms of heart failure include: Shortness of breath. This may happen  when doing things like climbing stairs. A cough that doesn't go away. Swelling of the feet, ankles, legs, or belly. This is called edema. Losing or gaining weight for no reason. Trouble breathing when lying flat. A fast heartbeat. Other symptoms may include: Feeling tired and not having energy. Feeling dizzy or light-headed. You may feel like you're going to faint. Not wanting to eat as much as normal. Feeling like you may vomit. Feeling confused. How is this diagnosed? Heart failure may be diagnosed based on: Your symptoms and medical history. A physical exam. Blood tests. Other tests. These may include: Chest X-ray. Electrocardiogram (ECG). Echocardiogram. Cardiac MRI. Cardiac catheterization and angiogram. How is this treated? Heart failure may be treated with: Medicines. These can be given to: Treat blood pressure, lower heart rates, or make the heart muscle pump stronger. Cause the kidneys to remove extra salt and water  from the blood through your pee. Changes in your daily life. These may include: Eating a healthy diet. Staying at a healthy weight. Quitting tobacco or drug use. Limiting or avoiding alcohol. Getting regular exercise. Taking part in a cardiac rehab program. This program helps you improve your health through exercise, education, and counseling. Surgery. Surgery can be done to: Open blocked arteries. Repair valves. Put a device in the heart. This might  be a pacemaker, a device to treat abnormal heart rhythms, or a device to help the heart pump better. A heart transplant. This means getting a healthy heart from a donor. This is done when other treatments have not helped. Follow these instructions at home: Treat other conditions as told by your health care provider. These may include high blood pressure or lung disease. Learn as much as you can about heart failure. Keep all follow-up visits. Your provider will want to check on your condition. Where to find  more information American Heart Association: heart.org Centers for Disease Control and Prevention: riphit.se This information is not intended to replace advice given to you by your health care provider. Make sure you discuss any questions you have with your health care provider. Document Revised: 12/25/2022 Document Reviewed: 07/16/2022 Elsevier Patient Education  2024 Elsevier Inc.   Heart Failure: How to Manage Heart failure is a long-term condition where your heart can't pump enough blood through your body. When this happens, parts of your body don't get the blood and oxygen  they need. There's no cure for heart failure, so it's important to take good care of yourself and follow the treatment plan you set with your health care provider. If you're living with heart failure, there are ways to help you manage the disease. How to manage lifestyle changes Living with heart failure requires you to make changes in your daily life. Your health care team will teach you about the changes you need to make. These changes can help improve your symptoms and lower your risk of going to the hospital. Work with your provider to create a plan for you. Activity Ask your provider about cardiac rehabilitation programs. These programs include physical activity. If no cardiac rehab program is available, ask your provider what exercises are safe for you to do. Ask what things are safe for you to do at home. Ask when you can go back to work or school. Pace your daily activities and allow time for rest. Managing stress It's normal to have many emotions when you're told you have heart failure. You may have fear, sadness, and anger. If you need help coping with any of these emotions, let your provider know. Here are some ways to help yourself manage these emotions: Talk to your friends and family about your condition. They can give you support and guidance. Explain your symptoms to them. If comfortable, you  can invite them to attend appointments with you. Join a support group for people with heart failure. Talking with others who have the same symptoms may give you new ways of coping with your disease and your emotions. Accept help from others. Do not be ashamed if you need help. Use stress management techniques. Try things like meditation, breathing exercises, or listening to relaxing music. Conditions like depression and anxiety are common in people with heart failure. Pay attention to changes in your mood, emotions, and stress levels. Tell your provider if you have any of the following symptoms: Trouble sleeping or a change in your sleeping patterns. Feeling sad or depressed. Losing interest in activities you normally enjoy. Feeling irritable or crying for no reason. Often worrying about the future. Work If heart failure affects your ability to work, you may need to talk with your provider about making a plan for changes. This may include: Reducing your work hours. Finding tasks that require less effort. Planning rest periods during work hours. Travel Talk with your provider if you plan to travel. There may  be times when your provider suggests that you don't travel or you wait until your condition has improved. Bring your medicine and a list of your medicines. If you're traveling by public transportation (airplane, train, bus), keep your medicines with you in a carry-on bag. If you have special needs, contact the transportation company prior to traveling. This may include needs related to diet, oxygen , a wheelchair, a seating request, or help with luggage. Think about finding a medical facility in the area you'll be traveling to. Find out what your health insurance will cover. If you use oxygen , make sure you bring enough oxygen  with you. If you have a battery-powered oxygen  device, bring a fully charged extra battery with you. If you have an implanted device, bring a note from your provider  and tell the security screening workers that you have the device. You may need to go through special screening. Sexual activity  Ask your provider when it's safe for you to resume sexual activity. You may need to start slowly and increase intimacy over time. Regular exercise can benefit your sex life by building strength and endurance. Sleep If your condition affects your sleep, find ways to improve how well you sleep at night. These tips may help: Sleep lying on your side, or sleep with your head raised. Try: Raising the head of your bed. Using more than one pillow. Ask your provider about screening for sleep apnea. Try to go to sleep and wake up at the same times every day. Sleep in a dark, cool room. Do not exercise or eat for a few hours before bedtime. Plan rest periods during the day. But don't take long naps.  Where to find support In addition to talking with your family or friends, consider talking with: A mental health professional or therapist. A member of your church, faith, or community group. Other sources of support include: Local support groups. Ask your provider about groups near you. Online support groups, such as those found through: Heart Failure Society of America: hfsa.org American Heart Association: supportnetwork.heart.org Local community agencies or social agencies. A palliative care specialist. Palliative care can help manage symptoms, promote comfort, improve quality of life, and maintain dignity. Where to find more information To learn more, go to these websites: Centers for Disease Control and Prevention at tonerpromos.no. Then: Click Health Topics. Type heart failure in the search box. American Heart Association: heart.org National Heart, Lung, and Blood Institute: buffalodrycleaner.gl Contact a health care provider if: You have trouble breathing when you're active. You have swelling in your feet or legs. You gain 2-3 lb (0.9-1.4 kg) in 24 hours, or 5 lb (2.3  kg) in a week. You have a dry cough. You're not able to take part in your usual physical activities. You feel dizzy or light-headed when you stand up. You have trouble sleeping. You have a decrease in appetite. You have symptoms of depression or anxiety. Get help right away if: You have trouble breathing when resting. You have trouble staying awake or you feel confused. You have chest pain. You faint. You have fast or irregular heartbeats. These symptoms may be an emergency. Call 911 right away. Do not wait to see if the symptoms will go away. Do not drive yourself to the hospital. This information is not intended to replace advice given to you by your health care provider. Make sure you discuss any questions you have with your health care provider. Document Revised: 09/27/2022 Document Reviewed: 09/27/2022 Elsevier Patient Education  2024 Elsevier  Inc.   COPD Action Plan A COPD action plan is a description of what to do when you have a flare (exacerbation) of chronic obstructive pulmonary disease (COPD). Your action plan is a color-coded plan that lists the symptoms that indicate whether your condition is under control and what actions to take. If you have symptoms in the green zone, it means you are doing well that day. If you have symptoms in the yellow zone, it means you are having a bad day or an exacerbation. If you have symptoms in the red zone, you need urgent medical care. Follow the plan that you and your health care provider developed. Review your plan with your health care provider at each visit. Red zone Symptoms in this zone mean that you should get medical help right away. They include: Feeling very short of breath, even when you are resting. Not being able to do any activities because of poor breathing. Not being able to sleep because of poor breathing. Fever or shaking chills. Feeling confused or very sleepy. Chest pain. Coughing up blood. If you have any of  these symptoms, call emergency services (911 in the [place] or go to the nearest emergency room. Yellow zone Symptoms in this zone mean that your condition may be getting worse. They include: Feeling more short of breath than usual. Having less energy for daily activities than usual. Phlegm or mucus that is thicker than usual. Needing to use your rescue inhaler or nebulizer more often than usual. More ankle swelling than usual. Coughing more than usual. Feeling like you have a chest cold. Trouble sleeping due to COPD symptoms. Decreased appetite. COPD medicines not helping as much as usual. If you experience any yellow symptoms: Keep taking your daily medicines as directed. Use your quick-relief inhaler as told by your health care provider. If you were prescribed steroid medicine to take by mouth (oral medicine), start taking it as told by your health care provider. If you were prescribed an antibiotic medicine, start taking it as told by your health care provider. Do not stop taking the antibiotic even if you start to feel better. Use oxygen  as told by your health care provider. Get more rest. Do your pursed-lip breathing exercises. Do not smoke. Avoid any irritants in the air. If your signs and symptoms do not improve after taking these steps, call your health care provider right away. Green zone Symptoms in this zone mean that you are doing well. They include: Being able to do your usual activities and exercise. Having the usual amount of coughing, including the same amount of phlegm or mucus. Being able to sleep well. Having a good appetite. Where to find more information: You can find more information about COPD from: American Lung Association, My COPD Action Plan: www.lung.org COPD Foundation: www.copdfoundation.org National Heart, Lung, & Blood Institute: popsteam.is Follow these instructions at home: Continue taking your daily medicines as told by your health care  provider. Make sure you receive all the immunizations that your health care provider recommends, especially the pneumococcal and influenza vaccines. Wash your hands often with soap and water . Have family members wash their hands too. Regular hand washing can help prevent infections. Follow your usual exercise and diet plan. Avoid irritants in the air, such as smoke. Do not use any products that contain nicotine  or tobacco. These products include cigarettes, chewing tobacco, and vaping devices, such as e-cigarettes. If you need help quitting, ask your health care provider. Summary A COPD action plan tells you  what to do when you have a flare (exacerbation) of chronic obstructive pulmonary disease (COPD). Follow each action plan for your symptoms. If you have any symptoms in the red zone, call emergency services (911 in the [place] or go to the nearest emergency room. This information is not intended to replace advice given to you by your health care provider. Make sure you discuss any questions you have with your health care provider. Document Revised: 12/27/2022 Document Reviewed: 12/27/2022 Elsevier Patient Education  2024 [employer].

## 2024-03-17 NOTE — Patient Outreach (Signed)
 Complex Care Management   Visit Note  03/17/2024  Name:  Nicole Austin MRN: [id number] DOB: 02-26-1961  Situation: Referral received for Complex Care Management related to Heart Failure and COPD I obtained verbal consent from Patient.  Visit completed with Patient  on the phone  Background:   Past Medical History:  Diagnosis Date   ADHD (attention deficit hyperactivity disorder)    Apnea, sleep 06/30/2015   Arthritis    Asthma    Asthma with acute exacerbation 06/30/2015   Benign neoplasm of ascending colon    Bipolar 1 disorder (HCC)    Chronic pain    COPD, moderate (HCC) 11/18/2013   Depression    DOE (dyspnea on exertion)    Fall 07/04/2022   GERD (gastroesophageal reflux disease) 08/30/2015   Gout 08/16/2014   History of acute myocardial infarction 06/30/2015   History of panic attacks    HPV (human papilloma virus) infection 07/17/2013   Hyperlipidemia    Left knee pain 07/06/2022   Low HDL (under 40) 08/30/2015   MI (myocardial infarction) (HCC) 2018   MRSA infection 2023   groin abscess   Nose colonized with MRSA 03/21/2022   a.) PCR (+) prior to LEFT THA   Osteoporosis    Parkinson disease (HCC)    Polyp of sigmoid colon    Pre-diabetes    Prediabetes 05/20/2016   PTSD (post-traumatic stress disorder)    Restless leg syndrome    Rhabdomyolysis    Sleep apnea 06/30/2015   Stage 3 severe COPD by GOLD classification (HCC) 11/18/2013   Stroke (HCC) 2018   right arm weakness   Traumatic rhabdomyolysis 07/04/2022   Tremors of nervous system    hands   Uncomplicated asthma 06/30/2015   Varicella 02/25/2017    Assessment: Patient Reported Symptoms:  Cognitive Cognitive Status: No symptoms reported Cognitive/Intellectual Conditions Management [RPT]: None reported or documented in medical history or problem list   Health Maintenance Behaviors: Annual physical exam  Neurological Neurological Review of Symptoms: No symptoms reported Neurological  Management Strategies: Routine screening, Medication therapy  HEENT HEENT Symptoms Reported: No symptoms reported      Cardiovascular Cardiovascular Symptoms Reported: Swelling in legs or feet Does patient have uncontrolled Hypertension?: No Cardiovascular Management Strategies: Medication therapy, Routine screening Weight: 255 lb 8 oz (115.9 kg) Cardiovascular Comment: Reports legs swollen, no redness, just feel tight, Patient out of Torsemide  - called pharmacy and will deliver today - reviewed need to weigh daily and call provider following HF Action Plan red flags for ED also reviewed - resending via mail and instructed to put on refrigerator for easy review, denies other sx HF  Respiratory Other Respiratory Symptoms: reports sinuses much improved, completed prednisone  from PCP and few days of doxycycline  remaining,mild SOB and wearing O2 2L at night, no cough, reviewed sx to call provider    Endocrine Endocrine Symptoms Reported: Not assessed Is patient diabetic?: No    Gastrointestinal Gastrointestinal Symptoms Reported: Other Other Gastrointestinal Symptoms: reports stools loose but not diarrhea, occasional nausea - taking doxycycline , discussed staying hydrated Additional Gastrointestinal Details: promentazine prn, eating healthy diet Gastrointestinal Management Strategies: Medication therapy    Genitourinary Genitourinary Symptoms Reported: Incontinence Additional Genitourinary Details: no UTI sx, [facility] - need PCP sign off, RNCM sending message to PCP at patient request Genitourinary Management Strategies: Incontinence garment/pad  Integumentary Integumentary Symptoms Reported: No symptoms reported Skin Management Strategies: Medication therapy, Routine screening  Musculoskeletal Musculoskelatal Symptoms Reviewed: No symptoms reported Additional Musculoskeletal Details: denies  pain, using cane as needed, no falls Musculoskeletal Management Strategies: Medical  device, Routine screening Falls in the past year?: No Number of falls in past year: 1 or less Was there an injury with Fall?: No Fall Risk Category Calculator: 0 Patient Fall Risk Level: Low Fall Risk Patient at Risk for Falls Due to: Impaired balance/gait, History of fall(s) Fall risk Follow up: Falls evaluation completed, Falls prevention discussed  Psychosocial Psychosocial Symptoms Reported: No symptoms reported Additional Psychological Details: reports mood is excellent Behavioral Management Strategies: Medication therapy Behavioral Health Comment: Psychiatrist (appt thursday) and counselor (every tuesday) Techniques to Cope with Loss/Stress/Change: Counseling, Meditation      03/17/2024    PHQ2-9 Depression Screening   Little interest or pleasure in doing things Several days  Feeling down, depressed, or hopeless Several days  PHQ-2 - Total Score 2  Trouble falling or staying asleep, or sleeping too much Not at all  Feeling tired or having little energy Not at all  Poor appetite or overeating  Not at all  Feeling bad about yourself - or that you are a failure or have let yourself or your family down Not at all  Trouble concentrating on things, such as reading the newspaper or watching television Several days  Moving or speaking so slowly that other people could have noticed.  Or the opposite - being so fidgety or restless that you have been moving around a lot more than usual Not at all  Thoughts that you would be better off dead, or hurting yourself in some way Not at all  PHQ2-9 Total Score 3  If you checked off any problems, how difficult have these problems made it for you to do your work, take care of things at home, or get along with other people Not difficult at all  Depression Interventions/Treatment Medication, Currently on Treatment    Today's Vitals   03/17/24 1118  Weight: 255 lb 8 oz (115.9 kg)      Medications Reviewed Today     Reviewed by Devra Lands, RN (Registered Nurse) on 03/17/24 at 1110  Med List Status: <None>   Medication Order Taking? Sig Documenting Provider Last Dose Status Informant  albuterol  (VENTOLIN  HFA) 108 (90 Base) MCG/ACT inhaler [id number] Yes Inhale 2 puffs into the lungs every 6 (six) hours as needed for wheezing or shortness of breath. Bernardo Fend, DO  Active Self  ALPRAZolam  (XANAX ) 0.5 MG tablet [id number] Yes Take 1 tablet (0.5 mg total) by mouth 2 (two) times daily.  Patient taking differently: Take 0.5 mg by mouth 2 (two) times daily. Per patient current bottle states 1 tab 3-4x daily as directed Dr Conny (psychiatrist)   Josette Ade, MD  Active Self  cetirizine  (ZYRTEC ) 10 MG tablet [id number] Yes Take 1 tablet (10 mg total) by mouth daily. Gareth Mliss FALCON, FNP  Active   cyanocobalamin  (VITAMIN B12) 1000 MCG tablet [id number] Yes TAKE 1 TABLET (1,000 MCG TOTAL) BY MOUTH DAILY. Gareth Mliss FALCON, FNP  Active Self  divalproex  (DEPAKOTE ) 250 MG DR tablet [id number] Yes Take 1 tablet (250 mg total) by mouth 2 (two) times daily. Mohammed Goldstein, MD  Active Self  doxycycline  (VIBRA -TABS) 100 MG tablet [id number] Yes Take 1 tablet (100 mg total) by mouth 2 (two) times daily for 7 days. Gareth Mliss FALCON, FNP  Active   escitalopram  (LEXAPRO ) 20 MG tablet [id number] Yes Take 20 mg by mouth daily. [provider]  Active Self  fluticasone  (FLONASE ) 50 MCG/ACT nasal spray [id number] Yes  Place 2 sprays into both nostrils daily. Gareth Mliss FALCON, FNP  Active   ipratropium-albuterol  (DUONEB) 0.5-2.5 (3) MG/3ML SOLN [id number] Yes TAKE 3 MLS BY NEBULIZATION 3 TIMES DAILY AS NEEDED Pender, Julie F, FNP  Active Self  magnesium  oxide (MAG-OX) 400 (240 Mg) MG tablet [id number] Yes TAKE ONE TABLET (400 MG TOTAL) BY MOUTH DAILY. Tapia, [name], PA-C  Active Self  miconazole  (MICOTIN) 2 % powder [id number] Yes Apply topically 2 (two) times daily as needed for itching (Candidal intertrigo). Tapia, [name], PA-C  Active Self   mometasone -formoterol  (DULERA ) 100-5 MCG/ACT AERO [id number]  Inhale 2 puffs into the lungs 2 (two) times daily.  Patient not taking: Reported on 03/11/2024   [place], Eric J, DO  Active Self           Med Note NORVEL KIRSCH   Tue Aug 06, 2023  1:53 PM)    Multiple Vitamins-Minerals (MULTIVITAMIN WITH MINERALS) tablet [id number] Yes Take 1 tablet by mouth daily. [provider]  Active Self  nystatin  (MYCOSTATIN /NYSTOP ) powder [id number] Yes Apply 1 Application topically 3 (three) times daily as needed (yeast/fungal rashes or intertrigo). Wouk, Devaughn Sayres, MD  Active   nystatin  ointment (MYCOSTATIN ) [id number] Yes Apply 1 Application topically 2 (two) times daily. Gareth Mliss FALCON, FNP  Active   primidone  (MYSOLINE ) 50 MG tablet [id number] Yes Take 25 mg by mouth in the morning and at bedtime. [provider]  Active Self  promethazine  (PHENERGAN ) 25 MG tablet [id number] Yes Take 25 mg by mouth every 6 (six) hours as needed for nausea or vomiting. [provider]  Active Self  rOPINIRole  (REQUIP  XL) 4 MG 24 hr tablet [id number] Yes Take 4 mg by mouth at bedtime. [provider]  Active Self  Tiotropium Bromide  (SPIRIVA  RESPIMAT) 2.5 MCG/ACT AERS [id number] Yes INHALE 2 PUFFS INTO THE LUNGS DAILY Pender, Julie F, FNP  Active Self  tiZANidine  (ZANAFLEX ) 2 MG tablet [id number] Yes TAKE ONE (1) TO TWO (2) TABLETS (2-4 MG TOTAL) BY MOUTH EVERY EIGHT HOURS AS NEEDED FOR MUSCLE SPASMS. Gareth Mliss FALCON, FNP  Active   torsemide  (DEMADEX ) 20 MG tablet [id number]  Take 1 tablet (20 mg total) by mouth daily. Gareth Mliss FALCON, FNP  Active Self            Recommendation:   PCP Follow-up Continue Current Plan of Care  Follow Up Plan:   Telephone follow-up in 1 month  Nestora Duos, MSN, RN [facility]  [facility], [facility] RN Care Manager Direct Dial: [phone] Fax: [phone]

## 2024-03-17 NOTE — Progress Notes (Signed)
 " Cardiology Office Note   Date:  03/17/2024  ID:  Nicole Austin, Nicole Austin 1960-09-23, MRN [id number] PCP: Gareth Mliss FALCON, FNP  [facility] Providers Cardiologist:  Caron Poser, MD     History of Present Illness Nicole Austin is a 64 y.o. female PMH morbid obesity, COPD, HFpEF who presents for further evaluation management of HFpEF.  Seen by PCP for this issue 01/22/2024.  Was hospitalized twice in November for HFpEF exacerbation and once at the end of December for COPD exacerbation/URI.  Last LDL 70 in 12/2023.  proBNP in normal range during last hospitalization.  Patient reports she has persistent DOE.  She also has lower extremity edema that is persistent, but not quite as severe as back in November.  She thinks the torsemide  is partially helping.  She also notes that she has intermittent chest discomfort which is somewhat new for her.  She notes a remote history of stroke.  Relevant CVD History -TTE 12/2023 normal biventricular function, grade 1 diastolic dysfunction, no significant valvular disease   ROS: Pt denies any chest discomfort, jaw pain, arm pain, palpitations, syncope, presyncope, orthopnea, PND, or LE edema.  Studies Reviewed I have independently reviewed the patient's ECG, previous cardiac testing, previous medical records, previous blood work.  Physical Exam VS:  BP 122/70 (BP Location: Left Arm, Patient Position: Sitting, Cuff Size: Large)   Pulse 72   Ht 5' 1 (1.549 m)   Wt 256 lb (116.1 kg)   LMP  (LMP Unknown)   SpO2 96%   BMI 48.37 kg/m        Wt Readings from Last 3 Encounters:  03/17/24 256 lb (116.1 kg)  03/17/24 255 lb 8 oz (115.9 kg)  03/11/24 257 lb (116.6 kg)    GEN: No acute distress. NECK: Unable to assess given habitus; No carotid bruits. CARDIAC: RRR, no murmurs, rubs, gallops. RESPIRATORY: Distant lung sounds, difficult to auscultate clearly EXTREMITIES:  Warm and well-perfused.  +2 edema bilaterally.  ASSESSMENT AND  PLAN HFpEF Patient presents for further evaluation of HFpEF.  She is NYHA II-III.  Difficult volume exam, but she does have persistent lower extremity edema.  She does have mild orthopnea.  Favor her to still be volume overloaded at this point.  Plan: - Start Farxiga  10 mg daily - Increase torsemide  to 40 mg daily - Check BMP/BNP - Return to clinic in 2 weeks for lab recheck and ongoing diuretic titration - If we are struggling to accurately evaluate her volume status, will send for RHC  Chest discomfort Patient notes chest discomfort which is relatively new.  She does certainly have risk factors for obstructive CAD.  This needs further investigation.  Plan: - Stress PET to further stratify given habitus  Morbid obesity Complicating all aspects of care.  Encouraged continued weight loss efforts.  Should consider GLP-1s if conservative measures fail.  History of stroke Patient reports a remote history of stroke.  Last LDL 70 12/2023.  She reports taking baby aspirin , but no cholesterol medications.  LDL goal less than 55 given history of ASCVD event.  Plan: - Continue ASA 81 mg daily - Start Crestor  10 mg daily; will recheck lipids next time     Informed Consent   The risks [chest pain, shortness of breath, cardiac arrhythmias, dizziness, blood pressure fluctuations, myocardial infarction, stroke/transient ischemic attack, nausea, vomiting, allergic reaction, radiation exposure, metallic taste sensation and life-threatening complications (estimated to be 1 in 10,000)], benefits (risk stratification, diagnosing coronary artery disease, treatment  guidance) and alternatives of a cardiac PET stress test were discussed in detail with Ms. Reindl and she agrees to proceed.     Dispo: RTC 1-2 weeks to reassess diuretic response/volume status  Signed, Caron Poser, MD  "

## 2024-03-17 NOTE — Patient Instructions (Signed)
 Medication Instructions:  Your physician recommends the following medication changes.   START TAKING: Farxiga  10 mg once daily Torsemide  40 mg once daily Rosuvastatin  (CRESTOR ) 10 mg  Adding Aspirin  81 mg to your medication list  Take all other medications as prescribed  *If you need a refill on your cardiac medications before your next appointment, please call your pharmacy*  Lab Work: Your provider would like for you to have following labs drawn today BNP BMP.   If you have labs (blood work) drawn today and your tests are completely normal, you will receive your results only by: MyChart Message (if you have MyChart) OR A paper copy in the mail If you have any lab test that is abnormal or we need to change your treatment, we will call you to review the results.  Testing/Procedures:   Please report to Radiology at the [facility] Main Entrance 30 minutes early for your test.  [address] [place], [place] [address]                         OR   Please report to Radiology at [facility] Main Entrance, medical mall, 30 mins prior to your test.  [address]  [place], [place]  How to Prepare for Your Cardiac PET/CT Stress Test:  Nothing to eat or drink, except water , 3 hours prior to arrival time.  NO caffeine/decaffeinated products, or chocolate 12 hours prior to arrival. (Please note decaffeinated beverages (teas/coffees) still contain caffeine).  If you have caffeine within 12 hours prior, the test will need to be rescheduled.  Medication instructions:  Hold Torsemide  until after your Cardiac Pet Stress Scan;  You may take your remaining medications with water .  NO perfume, cologne or lotion on chest or abdomen area. FEMALES - Please avoid wearing dresses to this appointment.  Total time is 1 to 2 hours; you may want to bring reading material for the waiting time.   In preparation for your appointment, medication and supplies  will be purchased.  Appointment availability is limited, so if you need to cancel or reschedule, please call the Radiology Department Scheduler at [phone] 24 hours in advance to avoid a cancellation fee of $100.00  What to Expect When you Arrive:  Once you arrive and check in for your appointment, you will be taken to a preparation room within the Radiology Department.  A technologist or Nurse will obtain your medical history, verify that you are correctly prepped for the exam, and explain the procedure.  Afterwards, an IV will be started in your arm and electrodes will be placed on your skin for EKG monitoring during the stress portion of the exam. Then you will be escorted to the PET/CT scanner.  There, staff will get you positioned on the scanner and obtain a blood pressure and EKG.  During the exam, you will continue to be connected to the EKG and blood pressure machines.  A small, safe amount of a radioactive tracer will be injected in your IV to obtain a series of pictures of your heart along with an injection of a stress agent.    After your Exam:  It is recommended that you eat a meal and drink a caffeinated beverage to counter act any effects of the stress agent.  Drink plenty of fluids for the remainder of the day and urinate frequently for the first couple of hours after the exam.  Your doctor will  inform you of your test results within 7-10 business days.  For more information and frequently asked questions, please visit our website: [email or web address]  For questions about your test or how to prepare for your test, please call: Cardiac Imaging Nurse Navigators Office: [phone]   Follow-Up: At [facility], you and your health needs are our priority.  As part of our continuing mission to provide you with exceptional heart care, our providers are all part of one team.  This team includes your primary Cardiologist (physician) and Advanced Practice  Providers or APPs (Physician Assistants and Nurse Practitioners) who all work together to provide you with the care you need, when you need it.  Your next appointment:   1 - 2  week(s)  Provider:   Caron Poser, MD    We recommend signing up for the patient portal called MyChart.  Sign up information is provided on this After Visit Summary.  MyChart is used to connect with patients for Virtual Visits (Telemedicine).  Patients are able to view lab/test results, encounter notes, upcoming appointments, etc.  Non-urgent messages can be sent to your provider as well.   To learn more about what you can do with MyChart, go to [email or web address].

## 2024-03-18 ENCOUNTER — Ambulatory Visit: Payer: Self-pay

## 2024-03-19 LAB — BASIC METABOLIC PANEL WITH GFR
BUN/Creatinine Ratio: 12 (ref 12–28)
BUN: 12 mg/dL (ref 8–27)
CO2: 26 mmol/L (ref 20–29)
Calcium: 9.4 mg/dL (ref 8.7–10.3)
Chloride: 92 mmol/L — ABNORMAL LOW (ref 96–106)
Creatinine, Ser: 1.04 mg/dL — ABNORMAL HIGH (ref 0.57–1.00)
Glucose: 80 mg/dL (ref 70–99)
Potassium: 4.7 mmol/L (ref 3.5–5.2)
Sodium: 137 mmol/L (ref 134–144)
eGFR: 60 mL/min/1.73

## 2024-03-19 LAB — BRAIN NATRIURETIC PEPTIDE: BNP: 96.6 pg/mL (ref 0.0–100.0)

## 2024-03-20 ENCOUNTER — Telehealth: Admitting: Nurse Practitioner

## 2024-03-20 ENCOUNTER — Ambulatory Visit: Payer: Self-pay

## 2024-03-20 ENCOUNTER — Encounter: Payer: Self-pay | Admitting: Nurse Practitioner

## 2024-03-20 DIAGNOSIS — J324 Chronic pansinusitis: Secondary | ICD-10-CM | POA: Diagnosis not present

## 2024-03-20 MED ORDER — AMOXICILLIN-POT CLAVULANATE 875-125 MG PO TABS: 1.0000 | ORAL_TABLET | Freq: Two times a day (BID) | ORAL | 0 refills | Status: AC

## 2024-03-20 MED ORDER — PREDNISONE 10 MG (21) PO TBPK: ORAL_TABLET | ORAL | 0 refills | Status: AC

## 2024-03-20 MED ORDER — AZELASTINE HCL 0.1 % NA SOLN: 1.0000 | Freq: Two times a day (BID) | NASAL | 5 refills | Status: AC

## 2024-03-20 NOTE — Progress Notes (Signed)
 "  Name: Nicole Austin   MRN: [id number]    DOB: 02/19/1961   Date:03/20/2024       Progress Note  Subjective  Chief Complaint  Chief Complaint  Patient presents with   Headache    Pt c/o headache x1 week. Nausea present.     I connected with  Nicole Austin  on 03/20/24 at  2:20 PM EST by a video enabled telemedicine application and verified that I am speaking with the correct person using two identifiers.  I discussed the limitations of evaluation and management by telemedicine and the availability of in person appointments. The patient expressed understanding and agreed to proceed with a virtual visit  Staff also discussed with the patient that there may be a patient responsible charge related to this service. Patient Location: home Provider Location: [facility] Additional Individuals present: alone  HPI   Discussed the use of AI scribe software for clinical note transcription with the patient, who gave verbal consent to proceed.  History of Present Illness Nicole Austin is a 64 year old female who presents with persistent sinus pressure and headache.  Sinus pressure and headache - Persistent sensation of pressure in the sinus cavity, particularly between the eyes - Severe headache located at the top of the sinus cavity - Symptoms have continued despite recent treatments - Significant relief following a recent course of antibiotics, but pressure sensation persists - Currently taking Mucinex  - Completed a course of steroids for her lungs in the last few days - Ongoing discomfort despite these interventions    Patient Active Problem List   Diagnosis Date Noted   Influenza A 02/25/2024   Shortness of breath 01/12/2024   Acute diastolic (congestive) heart failure (HCC) 01/10/2024   Acute on chronic diastolic CHF (congestive heart failure) (HCC) 01/08/2024   CHF (congestive heart failure) (HCC) 01/08/2024   Moderately severe major depression (HCC) 08/07/2023    Bipolar affective disorder, current episode mixed (HCC) 03/15/2023   COPD exacerbation (HCC) 02/08/2023   COVID-19 virus infection 02/08/2023   S/P total left hip arthroplasty 10/09/2022   SIADH (syndrome of inappropriate ADH production) 07/09/2022   Iron  deficiency anemia 07/07/2022   History of methicillin resistant staphylococcus aureus (MRSA) 05/03/2022   Avascular necrosis of bones of both hips (HCC) 05/03/2022   Chronic prescription benzodiazepine use 05/03/2022   Polypharmacy 04/09/2022   Sinus bradycardia 04/09/2022   Depression with anxiety 04/09/2022   MRSA (methicillin resistant staph aureus) culture positive 05/04/2021   Prolonged QT interval 01/01/2021   Bipolar disorder with depression (HCC) 02/25/2017   ADD (attention deficit disorder) 02/25/2017   Marijuana use 02/13/2017   Prediabetes 05/20/2016   Neuropathy, peripheral 05/07/2016   Vitamin B12 deficiency 10/24/2015   Fibromyalgia 08/30/2015   OP (osteoporosis) 06/30/2015   Sleep apnea 06/30/2015   HLD (hyperlipidemia) 05/13/2015   Class 3 severe obesity with serious comorbidity and body mass index (BMI) of 45.0 to 49.9 in adult (HCC) 08/16/2014   Obesity, Class III, BMI 40-49.9 (morbid obesity) (HCC) 08/16/2014   Low back pain with sciatica 08/04/2014   Tobacco abuse 08/04/2014   Anxiety, generalized 11/24/2013   COPD (chronic obstructive pulmonary disease) (HCC) 11/18/2013   Osteoarthritis of knee, unspecified 07/15/2013    Social History   Tobacco Use   Smoking status: Every Day    Current packs/day: 0.00    Average packs/day: 0.5 packs/day for 36.0 years (18.0 ttl pk-yrs)    Types: Cigarettes    Start date: 03/13/1986  Last attempt to quit: 03/13/2022    Years since quitting: 2.0    Passive exposure: Past   Smokeless tobacco: Never   Tobacco comments:    Pt using nicotine  patches; 1/2 PPD as of 03/21/21  Substance Use Topics   Alcohol use: No    Alcohol/week: 0.0 standard drinks of alcohol     Current Medications[1]  Allergies[2]  I personally reviewed active problem list, medication list, allergies with the patient/caregiver today.  ROS  Ten systems reviewed and is negative except as mentioned in HPI   Objective  Virtual encounter, vitals not obtained.  There is no height or weight on file to calculate BMI.  Nursing Note and Vital Signs reviewed.  Physical Exam  Awake, alert and oriented, speaking in complete sentences   Results for orders placed or performed in visit on 03/17/24 (from the past 72 hours)  Brain natriuretic peptide     Status: None   Collection Time: 03/17/24  3:34 PM  Result Value Ref Range   BNP 96.6 0.0 - 100.0 pg/mL    Comment: Siemens ADVIA Centaur XP methodology  Basic metabolic panel with GFR     Status: Abnormal   Collection Time: 03/17/24  3:34 PM  Result Value Ref Range   Glucose 80 70 - 99 mg/dL   BUN 12 8 - 27 mg/dL   Creatinine, Ser 8.95 (H) 0.57 - 1.00 mg/dL   eGFR 60 >40 fO/fpw/8.26   BUN/Creatinine Ratio 12 12 - 28   Sodium 137 134 - 144 mmol/L   Potassium 4.7 3.5 - 5.2 mmol/L   Chloride 92 (L) 96 - 106 mmol/L   CO2 26 20 - 29 mmol/L   Calcium  9.4 8.7 - 10.3 mg/dL   *Note: Due to a large number of results and/or encounters for the requested time period, some results have not been displayed. A complete set of results can be found in Results Review.    Assessment & Plan  Assessment and Plan Assessment & Plan Chronic pansinusitis Persistent sinus pressure and headache despite recent course of doxycycline . Symptoms improved with antibiotics but not fully resolved. Current treatment includes Mucin and recent completion of steroids for lung issues. Next step is referral to ENT if symptoms do not improve. - Prescribed Augmentin  for continued antibiotic coverage. - Prescribed Astelin nasal spray to address nasal symptoms. - Prescribed Steripred to reduce inflammation. - Advised to obtain medications before weather  worsens. - Plan for ENT referral if symptoms do not improve.      -Red flags and when to present for emergency care or RTC including fever >101.44F, chest pain, shortness of breath, new/worsening/un-resolving symptoms,  reviewed with patient at time of visit. Follow up and care instructions discussed and provided in AVS. - I discussed the assessment and treatment plan with the patient. The patient was provided an opportunity to ask questions and all were answered. The patient agreed with the plan and demonstrated an understanding of the instructions.  I provided 15 minutes of non-face-to-face time during this encounter.  Katerina Zurn F Merrillyn Ackerley, FNP      [1]  Current Outpatient Medications:    albuterol  (VENTOLIN  HFA) 108 (90 Base) MCG/ACT inhaler, Inhale 2 puffs into the lungs every 6 (six) hours as needed for wheezing or shortness of breath., Disp: 8.5 g, Rfl: 0   ALPRAZolam  (XANAX ) 0.5 MG tablet, Take 1 tablet (0.5 mg total) by mouth 2 (two) times daily. (Patient taking differently: Take 0.5 mg by mouth 2 (two) times daily. Per  patient current bottle states 1 tab 3-4x daily as directed Dr Conny (psychiatrist)), Disp: 10 tablet, Rfl: 0   amoxicillin -clavulanate (AUGMENTIN ) 875-125 MG tablet, Take 1 tablet by mouth 2 (two) times daily., Disp: 20 tablet, Rfl: 0   aspirin  EC 81 MG tablet, Take 1 tablet (81 mg total) by mouth daily. Swallow whole., Disp: , Rfl:    azelastine (ASTELIN) 0.1 % nasal spray, Place 1 spray into both nostrils 2 (two) times daily. Use in each nostril as directed, Disp: 30 mL, Rfl: 5   cetirizine  (ZYRTEC ) 10 MG tablet, Take 1 tablet (10 mg total) by mouth daily., Disp: 30 tablet, Rfl: 11   cyanocobalamin  (VITAMIN B12) 1000 MCG tablet, TAKE 1 TABLET (1,000 MCG TOTAL) BY MOUTH DAILY., Disp: 90 tablet, Rfl: 1   dapagliflozin  propanediol (FARXIGA ) 10 MG TABS tablet, Take 1 tablet (10 mg total) by mouth daily before breakfast., Disp: 90 tablet, Rfl: 3   divalproex  (DEPAKOTE ) 250 MG  DR tablet, Take 1 tablet (250 mg total) by mouth 2 (two) times daily., Disp: 60 tablet, Rfl: 2   escitalopram  (LEXAPRO ) 20 MG tablet, Take 20 mg by mouth daily., Disp: , Rfl:    fluticasone  (FLONASE ) 50 MCG/ACT nasal spray, Place 2 sprays into both nostrils daily., Disp: 16 g, Rfl: 6   ipratropium-albuterol  (DUONEB) 0.5-2.5 (3) MG/3ML SOLN, TAKE 3 MLS BY NEBULIZATION 3 TIMES DAILY AS NEEDED, Disp: 180 mL, Rfl: 1   magnesium  oxide (MAG-OX) 400 (240 Mg) MG tablet, TAKE ONE TABLET (400 MG TOTAL) BY MOUTH DAILY., Disp: 30 tablet, Rfl: 1   miconazole  (MICOTIN) 2 % powder, Apply topically 2 (two) times daily as needed for itching (Candidal intertrigo)., Disp: 85 g, Rfl: 2   mometasone -formoterol  (DULERA ) 100-5 MCG/ACT AERO, Inhale 2 puffs into the lungs 2 (two) times daily., Disp: 3 each, Rfl: 0   Multiple Vitamins-Minerals (MULTIVITAMIN WITH MINERALS) tablet, Take 1 tablet by mouth daily., Disp: , Rfl:    nystatin  (MYCOSTATIN /NYSTOP ) powder, Apply 1 Application topically 3 (three) times daily as needed (yeast/fungal rashes or intertrigo)., Disp: 60 g, Rfl: 2   nystatin  ointment (MYCOSTATIN ), Apply 1 Application topically 2 (two) times daily., Disp: 30 g, Rfl: 0   predniSONE  (STERAPRED UNI-PAK 21 TAB) 10 MG (21) TBPK tablet, Use as directed., Disp: 21 each, Rfl: 0   primidone  (MYSOLINE ) 50 MG tablet, Take 25 mg by mouth in the morning and at bedtime., Disp: , Rfl:    promethazine  (PHENERGAN ) 25 MG tablet, Take 25 mg by mouth every 6 (six) hours as needed for nausea or vomiting., Disp: , Rfl:    rOPINIRole  (REQUIP  XL) 4 MG 24 hr tablet, Take 4 mg by mouth at bedtime., Disp: , Rfl:    rosuvastatin  (CRESTOR ) 10 MG tablet, Take 1 tablet (10 mg total) by mouth daily., Disp: 90 tablet, Rfl: 3   Tiotropium Bromide  (SPIRIVA  RESPIMAT) 2.5 MCG/ACT AERS, INHALE 2 PUFFS INTO THE LUNGS DAILY, Disp: 4 g, Rfl: 0   tiZANidine  (ZANAFLEX ) 2 MG tablet, TAKE ONE (1) TO TWO (2) TABLETS (2-4 MG TOTAL) BY MOUTH EVERY EIGHT HOURS  AS NEEDED FOR MUSCLE SPASMS., Disp: 30 tablet, Rfl: 1   Torsemide  40 MG TABS, Take 40 mg by mouth daily., Disp: 90 tablet, Rfl: 3 [2]  Allergies Allergen Reactions   Glycopyrronium Dermatitis    glycopyrronium   Chantix  [Varenicline ] Nausea Only   Glycopyrrolate  Rash    Oral irritation   "

## 2024-03-20 NOTE — Telephone Encounter (Signed)
" °  FYI Only or Action Required?: FYI only for provider: appointment scheduled on 03/20/24.  Patient was last seen in primary care on 03/11/2024 by Gareth Mliss FALCON, FNP.  Called Nurse Triage reporting Sinusitis/headache/feels like something is in her sinuses.  Symptoms began several weeks ago.  Interventions attempted: Prescription medications: Flonase , Cetirizine , prednisone ,doxycycline .  Symptoms are: unchanged.  Triage Disposition: See PCP When Office is Open (Within 3 Days)  Patient/caregiver understands and will follow disposition?: Yes  Pt stated she does not have transportation for an office visit. Scheduled Virtual visit.        Message from Tiffini S sent at 03/20/2024  7:38 AM EST  Reason for Triage: Taking medication prescribed by pcp for sinus, have been two weeks- still have sinus pressure with headache in between both eyes- headaches everyday and congestion- feels like something is in the sinus cavity area that is not suppose to be there   Reason for Disposition  [1] Sinus congestion (pressure, fullness) AND [2] present > 10 days  Answer Assessment - Initial Assessment Questions 1. LOCATION: Where does it hurt?      Back of sinus cavity it feels like something is back there 2. ONSET: When did the sinus pain start?  (e.g., hours, days)      Since sx started  3. SEVERITY: How bad is the pain?   (Scale 0-10; or none, mild, moderate or severe)     9/10 5. NASAL CONGESTION: Is the nose blocked? If Yes, ask: Can you open it or must you breathe through your mouth?     no 6. NASAL DISCHARGE: Do you have discharge from your nose? If so ask, What color?     Not drainage 7. FEVER: Do you have a fever? If Yes, ask: What is it, how was it measured, and when did it start?      no 8. OTHER SYMPTOMS: Do you have any other symptoms? (e.g., sore throat, cough, earache, difficulty breathing)     Headache 9/10  everyday, pressure between eyes  O2 2  LPM  Protocols used: Sinus Pain or Congestion-A-AH  "

## 2024-03-23 ENCOUNTER — Telehealth

## 2024-03-24 ENCOUNTER — Ambulatory Visit

## 2024-03-25 ENCOUNTER — Ambulatory Visit

## 2024-03-26 ENCOUNTER — Ambulatory Visit: Payer: Self-pay

## 2024-03-26 DIAGNOSIS — Z0000 Encounter for general adult medical examination without abnormal findings: Secondary | ICD-10-CM | POA: Diagnosis not present

## 2024-03-26 DIAGNOSIS — Z1231 Encounter for screening mammogram for malignant neoplasm of breast: Secondary | ICD-10-CM

## 2024-03-26 NOTE — Patient Instructions (Addendum)
 Nicole Austin,  Thank you for taking the time for your Medicare Wellness Visit. I appreciate your continued commitment to your health goals. Please review the care plan we discussed, and feel free to reach out if I can assist you further.  Please note that Annual Wellness Visits do not include a physical exam. Some assessments may be limited, especially if the visit was conducted virtually. If needed, we may recommend an in-person follow-up with your provider.  Ongoing Care Seeing your primary care provider every 3 to 6 months helps us  monitor your health and provide consistent, personalized care. 07/22/24 @ 1:40 APPT W/ JULIE PENDER  Referrals If a referral was made during today's visit and you haven't received any updates within two weeks, please contact the referred provider directly to check on the status.  REFERRAL SENT FOR MAMMOGRAM You have an order for:  []   2D Mammogram  [x]   3D Mammogram  []   Bone Density     Please call for appointment:  [facility]  [address] [place] [address] [phone] [facility] [address] [place], [place] [address] [phone] [facility] [address] [place], [place] [address] [phone]   Make sure to wear two-piece clothing.  No lotions, powders, or deodorants the day of the appointment. Make sure to bring picture ID and insurance card.  Bring list of medications you are currently taking including any supplements.   Schedule your [place] screening mammogram through MyChart!   Log into your MyChart account.  Go to 'Visit' (or 'Appointments' if on mobile App) --> Schedule an Appointment  Under 'Select a Reason for Visit' choose the Mammogram Screening option.  Complete the pre-visit questions and select the time and place that best fits your schedule.   Recommended Screenings:  Health  Maintenance  Topic Date Due   Breast Cancer Screening  Never done   Colon Cancer Screening  08/08/2018   Pap with HPV screening  05/02/2023   COVID-19 Vaccine (4 - 2025-26 season) 10/28/2023   Medicare Annual Wellness Visit  03/20/2024   Flu Shot  05/26/2024*   DTaP/Tdap/Td vaccine (2 - Td or Tdap) 12/12/2025   Pneumococcal Vaccine for age over 53  Completed   HPV Vaccine (No Doses Required) Completed   Hepatitis C Screening  Completed   HIV Screening  Completed   Zoster (Shingles) Vaccine  Completed   Hepatitis B Vaccine  Aged Out   Meningitis B Vaccine  Aged Out  *Topic was postponed. The date shown is not the original due date.     Vision: Annual vision screenings are recommended for early detection of glaucoma, cataracts, and diabetic retinopathy. These exams can also reveal signs of chronic conditions such as diabetes and high blood pressure.  Dental: Annual dental screenings help detect early signs of oral cancer, gum disease, and other conditions linked to overall health, including heart disease and diabetes.  Please see the attached documents for additional preventive care recommendations.   NEXT AWV 04/01/25 @ 11:30 AM IN PERSON

## 2024-03-26 NOTE — Progress Notes (Signed)
 "  Chief Complaint  Patient presents with   Medicare Wellness     Subjective:   Nicole Austin is a 64 y.o. female who presents for a Medicare Annual Wellness Visit.  Visit info / Clinical Intake: Medicare Wellness Visit Type:: Subsequent Annual Wellness Visit Persons participating in visit and providing information:: patient Medicare Wellness Visit Mode:: Telephone If telephone:: video declined Since this visit was completed virtually, some vitals may be partially provided or unavailable. Missing vitals are due to the limitations of the virtual format.: Unable to obtain vitals - no equipment If Telephone or Video please confirm:: I connected with patient using audio/video enable telemedicine. I verified patient identity with two identifiers, discussed telehealth limitations, and patient agreed to proceed. Patient Location:: HOME Provider Location:: OFFICE Interpreter Needed?: No Pre-visit prep was completed: yes AWV questionnaire completed by patient prior to visit?: yes Date:: 03/17/24 Living arrangements:: (!) lives alone Patient's Overall Health Status Rating: good Typical amount of pain: some Does pain affect daily life?: no Are you currently prescribed opioids?: no  Dietary Habits and Nutritional Risks How many meals a day?: 2 Eats fruit and vegetables daily?: yes Most meals are obtained by: having others provide food In the last 2 weeks, have you had any of the following?: none Diabetic:: no  Functional Status Activities of Daily Living (to include ambulation/medication): (!) Needs Assist Feeding: Independent Dressing/Grooming: Needs assistance Bathing: Needs assistance Toileting: Needs assistance Transfer: Independent Ambulation: Independent with device- listed below Home Assistive Devices/Equipment: Cane Medication Administration: Independent Home Management (perform basic housework or laundry): Needs assistance (comment) Manage your own finances?:  yes Primary transportation is: facility / other ([facility]) Concerns about vision?: no *vision screening is required for WT[facility]* ([facility]) Concerns about hearing?: no  Fall Screening Falls in the past year?: 1 Number of falls in past year: 1 Was there an injury with Fall?: 1 Fall Risk Category Calculator: 3 Patient Fall Risk Level: High Fall Risk  Fall Risk Patient at Risk for Falls Due to: Impaired balance/gait; Orthopedic patient Fall risk Follow up: Falls evaluation completed; Falls prevention discussed  Home and Transportation Safety: All rugs have non-skid backing?: yes All stairs or steps have railings?: (!) no Grab bars in the bathtub or shower?: yes Have non-skid surface in bathtub or shower?: yes Good home lighting?: yes Regular seat belt use?: yes Hospital stays in the last year:: (!) yes How many hospital stays:: 1 Reason: FLU  Cognitive Assessment Difficulty concentrating, remembering, or making decisions? : yes Will 6CIT or Mini Cog be Completed: yes What year is it?: 0 points What month is it?: 0 points Give patient an address phrase to remember (5 components): [address] [place], [place] About what time is it?: 0 points Count backwards from 20 to 1: 0 points Say the months of the year in reverse: 0 points Repeat the address phrase from earlier: 2 points 6 CIT Score: 2 points  Advance Directives (For Healthcare) Does Patient Have a Medical Advance Directive?: No Does patient want to make changes to medical advance directive?: No - Patient declined Would patient like information on creating a medical advance directive?: No - Patient declined  Reviewed/Updated  Reviewed/Updated: Reviewed All (Medical, Surgical, Family, Medications, Allergies, Care Teams, Patient Goals)    Allergies (verified) Glycopyrronium, Chantix  [varenicline ], and Glycopyrrolate    Current Medications (verified) Outpatient Encounter Medications as of  03/26/2024  Medication Sig   albuterol  (VENTOLIN  HFA) 108 (90 Base) MCG/ACT inhaler Inhale 2 puffs  into the lungs every 6 (six) hours as needed for wheezing or shortness of breath.   ALPRAZolam  (XANAX ) 0.5 MG tablet Take 1 tablet (0.5 mg total) by mouth 2 (two) times daily.   amoxicillin -clavulanate (AUGMENTIN ) 875-125 MG tablet Take 1 tablet by mouth 2 (two) times daily.   aspirin  EC 81 MG tablet Take 1 tablet (81 mg total) by mouth daily. Swallow whole.   azelastine  (ASTELIN ) 0.1 % nasal spray Place 1 spray into both nostrils 2 (two) times daily. Use in each nostril as directed   cetirizine  (ZYRTEC ) 10 MG tablet Take 1 tablet (10 mg total) by mouth daily.   cyanocobalamin  (VITAMIN B12) 1000 MCG tablet TAKE 1 TABLET (1,000 MCG TOTAL) BY MOUTH DAILY.   dapagliflozin  propanediol (FARXIGA ) 10 MG TABS tablet Take 1 tablet (10 mg total) by mouth daily before breakfast.   divalproex  (DEPAKOTE ) 250 MG DR tablet Take 1 tablet (250 mg total) by mouth 2 (two) times daily.   escitalopram  (LEXAPRO ) 20 MG tablet Take 20 mg by mouth daily.   fluticasone  (FLONASE ) 50 MCG/ACT nasal spray Place 2 sprays into both nostrils daily.   ipratropium-albuterol  (DUONEB) 0.5-2.5 (3) MG/3ML SOLN TAKE 3 MLS BY NEBULIZATION 3 TIMES DAILY AS NEEDED   magnesium  oxide (MAG-OX) 400 (240 Mg) MG tablet TAKE ONE TABLET (400 MG TOTAL) BY MOUTH DAILY.   miconazole  (MICOTIN) 2 % powder Apply topically 2 (two) times daily as needed for itching (Candidal intertrigo).   mometasone -formoterol  (DULERA ) 100-5 MCG/ACT AERO Inhale 2 puffs into the lungs 2 (two) times daily.   Multiple Vitamins-Minerals (MULTIVITAMIN WITH MINERALS) tablet Take 1 tablet by mouth daily.   nystatin  (MYCOSTATIN /NYSTOP ) powder Apply 1 Application topically 3 (three) times daily as needed (yeast/fungal rashes or intertrigo).   nystatin  ointment (MYCOSTATIN ) Apply 1 Application topically 2 (two) times daily.   primidone  (MYSOLINE ) 50 MG tablet Take 25 mg by mouth in  the morning and at bedtime. (Patient taking differently: Take 50 mg by mouth in the morning and at bedtime.)   promethazine  (PHENERGAN ) 25 MG tablet Take 25 mg by mouth every 6 (six) hours as needed for nausea or vomiting.   rOPINIRole  (REQUIP  XL) 4 MG 24 hr tablet Take 4 mg by mouth at bedtime.   rosuvastatin  (CRESTOR ) 10 MG tablet Take 1 tablet (10 mg total) by mouth daily.   Tiotropium Bromide  (SPIRIVA  RESPIMAT) 2.5 MCG/ACT AERS INHALE 2 PUFFS INTO THE LUNGS DAILY   tiZANidine  (ZANAFLEX ) 2 MG tablet TAKE ONE (1) TO TWO (2) TABLETS (2-4 MG TOTAL) BY MOUTH EVERY EIGHT HOURS AS NEEDED FOR MUSCLE SPASMS.   Torsemide  40 MG TABS Take 40 mg by mouth daily.   predniSONE  (STERAPRED UNI-PAK 21 TAB) 10 MG (21) TBPK tablet Use as directed. (Patient not taking: Reported on 03/26/2024)   No facility-administered encounter medications on file as of 03/26/2024.    History: Past Medical History:  Diagnosis Date   ADHD (attention deficit hyperactivity disorder)    Apnea, sleep 06/30/2015   Arthritis    Asthma    Asthma with acute exacerbation 06/30/2015   Benign neoplasm of ascending colon    Bipolar 1 disorder (HCC)    Chronic pain    COPD, moderate (HCC) 11/18/2013   Depression    DOE (dyspnea on exertion)    Fall 07/04/2022   GERD (gastroesophageal reflux disease) 08/30/2015   Gout 08/16/2014   History of acute myocardial infarction 06/30/2015   History of panic attacks    HPV (human papilloma virus) infection 07/17/2013  Hyperlipidemia    Left knee pain 07/06/2022   Low HDL (under 40) 08/30/2015   MI (myocardial infarction) (HCC) 2018   MRSA infection 2023   groin abscess   Nose colonized with MRSA 03/21/2022   a.) PCR (+) prior to LEFT THA   Osteoporosis    Parkinson disease (HCC)    Polyp of sigmoid colon    Pre-diabetes    Prediabetes 05/20/2016   PTSD (post-traumatic stress disorder)    Restless leg syndrome    Rhabdomyolysis    Sleep apnea 06/30/2015   Stage 3 severe COPD by  GOLD classification (HCC) 11/18/2013   Stroke (HCC) 2018   right arm weakness   Traumatic rhabdomyolysis 07/04/2022   Tremors of nervous system    hands   Uncomplicated asthma 06/30/2015   Varicella 02/25/2017   Past Surgical History:  Procedure Laterality Date   BACK SURGERY     lumbar   CATARACT EXTRACTION W/PHACO Right 01/01/2019   Procedure: CATARACT EXTRACTION PHACO AND INTRAOCULAR LENS PLACEMENT (IOC) right vision blue;  Surgeon: Ferol Rogue, MD;  Location: [facility] ORS;  Service: Ophthalmology;  Laterality: Right;  US  00:39.4 CDE 5.49 Fluid Pack lot # [id number]   CATARACT EXTRACTION W/PHACO Left 01/29/2019   Procedure: CATARACT EXTRACTION PHACO AND INTRAOCULAR LENS PLACEMENT (IOC) LEFT Vision Blue;  Surgeon: Ferol Rogue, MD;  Location: [facility];  Service: Ophthalmology;  Laterality: Left;  US  00:51.1 CDE 4.38 Fluid Pack Lot # [id number]   COLONOSCOPY WITH PROPOFOL  N/A 08/07/2017   Procedure: COLONOSCOPY WITH PROPOFOL ;  Surgeon: Janalyn Keene NOVAK, MD;  Location: [facility];  Service: Endoscopy;  Laterality: N/A;   HAND SURGERY Left    fractured with pins   REPLACEMENT TOTAL KNEE Left 2016   SPINAL CORD STIMULATOR INSERTION  2014   SPINAL CORD STIMULATOR REMOVAL  2014   TOTAL HIP ARTHROPLASTY Left 10/09/2022   Procedure: TOTAL HIP ARTHROPLASTY ANTERIOR APPROACH;  Surgeon: Ernie Cough, MD;  Location: [facility];  Service: Orthopedics;  Laterality: Left;   TUBAL LIGATION     Family History  Problem Relation Age of Onset   Hernia Mother    Heart disease Mother    OCD Mother    Diabetes Mother    Parkinson's disease Father    Bipolar disorder Sister    Schizophrenia Sister    ADD / ADHD Sister    Alcohol abuse Brother    Bipolar disorder Sister    Paranoid behavior Sister    ADD / ADHD Sister    ADD / ADHD Son    Dementia Maternal Grandmother    Emphysema Maternal Grandfather    ADD / ADHD Son    ADD / ADHD Son    Depression Son    Social History    Occupational History   Occupation: Disability  Tobacco Use   Smoking status: Every Day    Current packs/day: 0.00    Average packs/day: 0.5 packs/day for 36.0 years (18.0 ttl pk-yrs)    Types: Cigarettes    Start date: 03/13/1986    Last attempt to quit: 03/13/2022    Years since quitting: 2.0    Passive exposure: Past   Smokeless tobacco: Never   Tobacco comments:    Pt using nicotine  patches; 1/2 PPD as of 03/21/21  Vaping Use   Vaping status: Former   Substances: Nicotine , Flavoring  Substance and Sexual Activity   Alcohol use: No    Alcohol/week: 0.0 standard drinks of alcohol   Drug use: Yes  Types: Marijuana    Comment: occasionally   Sexual activity: Not Currently   Tobacco Counseling Ready to quit: Not Answered Counseling given: Not Answered Tobacco comments: Pt using nicotine  patches; 1/2 PPD as of 03/21/21  SDOH Screenings   Food Insecurity: No Food Insecurity (03/26/2024)  Housing: Unknown (03/26/2024)  Transportation Needs: No Transportation Needs (03/26/2024)  Utilities: Not At Risk (03/26/2024)  Alcohol Screen: Low Risk (03/21/2023)  Depression (PHQ2-9): Low Risk (03/26/2024)  Financial Resource Strain: Low Risk  (08/08/2023)   [facility]  Physical Activity: Insufficiently Active (03/26/2024)  Social Connections: Moderately Isolated (03/26/2024)  Stress: No Stress Concern Present (03/26/2024)  Tobacco Use: High Risk (03/26/2024)  Health Literacy: Adequate Health Literacy (03/26/2024)   See flowsheets for full screening details  Depression Screen Depression Screening Exception Documentation Depression Screening Exception:: Medical reason (Illness - Aide calling 911 for ED)  PHQ 2 & 9 Depression Scale- Over the past 2 weeks, how often have you been bothered by any of the following problems? Little interest or pleasure in doing things: 1 Feeling down, depressed, or hopeless (PHQ Adolescent also includes...irritable): 1 PHQ-2  Total Score: 2 Trouble falling or staying asleep, or sleeping too much: 0 Feeling tired or having little energy: 0 Poor appetite or overeating (PHQ Adolescent also includes...weight loss): 0 Feeling bad about yourself - or that you are a failure or have let yourself or your family down: 0 Trouble concentrating on things, such as reading the newspaper or watching television (PHQ Adolescent also includes...like school work): 0 Moving or speaking so slowly that other people could have noticed. Or the opposite - being so fidgety or restless that you have been moving around a lot more than usual: 0 Thoughts that you would be better off dead, or of hurting yourself in some way: 0 PHQ-9 Total Score: 2 If you checked off any problems, how difficult have these problems made it for you to do your work, take care of things at home, or get along with other people?: Not difficult at all  Depression Treatment Depression Interventions/Treatment : EYV7-0 Score <4 Follow-up Not Indicated     Goals Addressed             This Visit's Progress    DIET - EAT MORE FRUITS AND VEGETABLES               Objective:    There were no vitals filed for this visit. There is no height or weight on file to calculate BMI.  Hearing/Vision screen No results found. Immunizations and Health Maintenance Health Maintenance  Topic Date Due   Mammogram  Never done   Colonoscopy  08/08/2018   Cervical Cancer Screening (HPV/Pap Cotest)  05/02/2023   COVID-19 Vaccine (4 - 2025-26 season) 10/28/2023   Influenza Vaccine  05/26/2024 (Originally 09/27/2023)   Medicare Annual Wellness (AWV)  03/26/2025   DTaP/Tdap/Td (2 - Td or Tdap) 12/12/2025   Pneumococcal Vaccine: 50+ Years  Completed   HPV VACCINES (No Doses Required) Completed   Hepatitis C Screening  Completed   HIV Screening  Completed   Zoster Vaccines- Shingrix   Completed   Hepatitis B Vaccines 19-59 Average Risk  Aged Out   Meningococcal B Vaccine  Aged  Out        Assessment/Plan:  This is a routine wellness examination for Hellen.  Patient Care Team: Gareth Mliss FALCON, FNP as PCP - General (Nurse Practitioner) Argentina Clap, MD as PCP - Cardiology (Cardiology) Daniel Lauth, MD  as Referring Physician (Psychiatry) Hambright, Delon CROME, LCSW as Social Worker (Professional Counselor) Leora Lynwood SAUNDERS, MD as Consulting Physician (Orthopedic Surgery) Tamea Dedra CROME, MD as Consulting Physician (Pulmonary Disease) Maree Jannett POUR, MD as Consulting Physician (Neurology) [facility], [facility] (Optometry) Devra Lands, RN as [facility] Care Management  I have personally reviewed and noted the following in the patients chart:   Medical and social history Use of alcohol, tobacco or illicit drugs  Current medications and supplements including opioid prescriptions. Functional ability and status Nutritional status Physical activity Advanced directives List of other physicians Hospitalizations, surgeries, and ER visits in previous 12 months Vitals Screenings to include cognitive, depression, and falls Referrals and appointments  Orders Placed This Encounter  Procedures   MM 3D SCREENING MAMMOGRAM BILATERAL BREAST    Standing Status:   Future    Expiration Date:   03/26/2025    Reason for Exam (SYMPTOM  OR DIAGNOSIS REQUIRED):   SCREENING FOR BREAST CANCER    Preferred imaging location?:   [facility]    Release to patient:   Immediate   In addition, I have reviewed and discussed with patient certain preventive protocols, quality metrics, and best practice recommendations. A written personalized care plan for preventive services as well as general preventive health recommendations were provided to patient.   Jhonnie GORMAN Das, LPN   [id number]/7973   Return in about 1 year (around 03/26/2025).  After Visit Summary: (MyChart) Due to this being a telephonic visit, the after visit summary with patients personalized plan was offered to  patient via MyChart   Nurse Notes: UTD ON SHOTS EXCEPT FLU; REFERRAL SENT FOR MAMMOGRAM; DECLINES COLONOSCOPY  No voiced or noted concerns at this time  "

## 2024-03-31 ENCOUNTER — Ambulatory Visit: Payer: Self-pay

## 2024-03-31 NOTE — Telephone Encounter (Signed)
 Lvm informing patient

## 2024-04-01 ENCOUNTER — Ambulatory Visit

## 2024-04-02 ENCOUNTER — Ambulatory Visit: Payer: Self-pay

## 2024-04-02 ENCOUNTER — Emergency Department
Admission: EM | Admit: 2024-04-02 | Discharge: 2024-04-02 | Disposition: A | Discharge status: Home / Self Care | Source: Home / Self Care | Attending: Emergency Medicine | Admitting: Emergency Medicine

## 2024-04-02 ENCOUNTER — Emergency Department

## 2024-04-02 ENCOUNTER — Other Ambulatory Visit: Payer: Self-pay

## 2024-04-02 DIAGNOSIS — M545 Low back pain, unspecified: Secondary | ICD-10-CM

## 2024-04-02 MED ORDER — LIDOCAINE 5 % EX PTCH: 1.0000 | MEDICATED_PATCH | Freq: Two times a day (BID) | CUTANEOUS | 0 refills | Status: AC

## 2024-04-02 MED ORDER — LIDOCAINE 5 % EX PTCH
1.0000 | MEDICATED_PATCH | CUTANEOUS | Status: DC
  Administered 2024-04-02: 1 via TRANSDERMAL
  Filled 2024-04-02: qty 1

## 2024-04-02 MED ORDER — OXYCODONE-ACETAMINOPHEN 5-325 MG PO TABS: 1.0000 | ORAL_TABLET | ORAL | 0 refills | Status: AC | PRN

## 2024-04-02 MED ORDER — OXYCODONE-ACETAMINOPHEN 5-325 MG PO TABS
1.0000 | ORAL_TABLET | Freq: Once | ORAL | Status: AC
  Administered 2024-04-02: 1 via ORAL
  Filled 2024-04-02: qty 1

## 2024-04-02 NOTE — ED Notes (Signed)
 Pt given coffee with approval from [facility] MD

## 2024-04-02 NOTE — Telephone Encounter (Signed)
 FYI Only or Action Required?: FYI only for provider: ED advised.  Patient was last seen in primary care on 03/20/2024 by Gareth Mliss FALCON, FNP.  Called Nurse Triage reporting Leg Pain.  Symptoms began 03/28/24.  Interventions attempted: Prescription medications: tizanidine .  Symptoms are: gradually worsening.  Triage Disposition: See HCP Within 4 Hours (Or PCP Triage)- advised 911 for transport to hospital for severe pain - 911 called by coworker to transport pt to hospital  Patient/caregiver understands and will follow disposition?: Yes         Message from [facility] E sent at 04/02/2024  8:34 AM EST  Summary: severe pain around groin wraps around leg   Reason for Triage: 3 day  Unable to move right leg severe pain around groin wraps around leg  Patient has physical therapy this morning at 10am         Reason for Disposition  [1] SEVERE pain (e.g., excruciating, unable to do any normal activities) AND [2] not improved after 2 hours of pain medicine  Answer Assessment - Initial Assessment Questions 1. ONSET: When did the pain start?      Last Friday after PT  2. LOCATION: Where is the pain located?      Right thigh to groin to buttocks and back.  3. PAIN: How bad is the pain?    (Scale 1-10; or mild, moderate, severe)     10 constant pt crying 4. WORK OR EXERCISE: Has there been any recent work or exercise that involved this part of the body?      Says she pushed herself for PT and hurt after PT 5. CAUSE: What do you think is causing the leg pain?     Needs total hip for osteo necrosis per pt 6. OTHER SYMPTOMS: Do you have any other symptoms? (e.g., chest pain, back pain, breathing difficulty, swelling, rash, fever, numbness, weakness)     lower back, stabbing pain 7. PREGNANCY: Is there any chance you are pregnant? When was your last menstrual period?     N/a  Protocols used: Leg Pain-A-AH

## 2024-04-02 NOTE — ED Triage Notes (Signed)
 Pt comes via [facility] with c/o back pain. Pt states this started Friday. Pt had PT at home on Thursday and might have pulled something Friday morning. Pt sats pain dow right leg too that is stabbing. VSS  Pt wears 2L [place] chronically and hx of copd and chf. Pt denies any sob.

## 2024-04-02 NOTE — ED Notes (Signed)
 Pt to xr

## 2024-04-02 NOTE — ED Provider Notes (Signed)
 "  [facility] Provider Note    Event Date/Time   First MD Initiated Contact with Patient 04/02/24 1134     (approximate)   History   Chief Complaint Back Pain   HPI  Nicole Austin is a 64 y.o. female with past medical history of hypertension, COPD, chronic respiratory failure on 2 L, HFpEF, and bipolar disorder who presents to the ED complaining of back pain.  Patient reports that 1 week ago she had physical therapy and the next day she woke up with severe pain in her lower back as well as her right thigh.  She states it has been difficult for her to walk or bear weight on her right leg due to the pain.  She denies any numbness or weakness in her legs and has not had any numbness in her groin or incontinence.  She denies any history of similar symptoms.     Physical Exam   Triage Vital Signs: ED Triage Vitals  Encounter Vitals Group     BP 04/02/24 1045 138/76     Girls Systolic BP Percentile --      Girls Diastolic BP Percentile --      Boys Systolic BP Percentile --      Boys Diastolic BP Percentile --      Pulse Rate 04/02/24 1045 73     Resp 04/02/24 1045 18     Temp 04/02/24 1045 98 F (36.7 C)     Temp src --      SpO2 04/02/24 1045 98 %     Weight --      Height --      Head Circumference --      Peak Flow --      Pain Score 04/02/24 1029 10     Pain Loc --      Pain Education --      Exclude from Growth Chart --     Most recent vital signs: Vitals:   04/02/24 1045  BP: 138/76  Pulse: 73  Resp: 18  Temp: 98 F (36.7 C)  SpO2: 98%    Constitutional: Alert and oriented. Eyes: Conjunctivae are normal. Head: Atraumatic. Nose: No congestion/rhinnorhea. Mouth/Throat: Mucous membranes are moist.  Cardiovascular: Normal rate, regular rhythm. Grossly normal heart sounds.  2+ radial and DP pulses bilaterally. Respiratory: Normal respiratory effort.  No retractions. Lungs CTAB. Gastrointestinal: Soft and nontender. No  distention. Musculoskeletal: Midline lumbar spinal tenderness to palpation noted as well as right paraspinal tenderness.  Tenderness noted over the right medial thigh. Neurologic:  Normal speech and language. No gross focal neurologic deficits are appreciated.    ED Results / Procedures / Treatments   Labs (all labs ordered are listed, but only abnormal results are displayed) Labs Reviewed - No data to display   RADIOLOGY Lumbar x-ray reviewed and interpreted by me with no fracture or dislocation.  PROCEDURES:  Critical Care performed: No  Procedures   MEDICATIONS ORDERED IN ED: Medications  lidocaine  (LIDODERM ) 5 % 1 patch (1 patch Transdermal Patch Applied 04/02/24 1221)  oxyCODONE -acetaminophen  (PERCOCET/ROXICET) 5-325 MG per tablet 1 tablet (1 tablet Oral Given 04/02/24 1221)     IMPRESSION / MDM / ASSESSMENT AND PLAN / ED COURSE  I reviewed the triage vital signs and the nursing notes.                              64 y.o. female with  past medical history of hypertension, COPD on 2 L, HFpEF, and bipolar disorder who presents to the ED complaining of pain in the middle of her lower back radiating down her right leg for the past 6 days.  Patient's presentation is most consistent with acute presentation with potential threat to life or bodily function.  Differential diagnosis includes, but is not limited to, lumbar strain, fracture, dislocation, lumbar radiculopathy, cauda equina.  Patient well-appearing and in no acute distress, vital signs are unremarkable.  She is neuro vastly intact to her bilateral lower extremities with no features concerning for cauda equina.  She does have significant tenderness over her right medial thigh, suspect muscle strain in this area.  She also has tenderness over midline lumbar spine, will check x-ray.  Plan to treat with Percocet and Lidoderm  patch, reassess following imaging.  Lumbar x-ray negative for acute finding, patient feeling better  following dose of pain medication and has been able to ambulate with a walker.  She is appropriate for discharge home with outpatient follow-up, was counseled to return to the ED for new or worsening symptoms.  Patient agrees with plan.      FINAL CLINICAL IMPRESSION(S) / ED DIAGNOSES   Final diagnoses:  Acute midline low back pain without sciatica     Rx / DC Orders   ED Discharge Orders          Ordered    lidocaine  (LIDODERM ) 5 %  Every 12 hours        04/02/24 1303    oxyCODONE -acetaminophen  (PERCOCET) 5-325 MG tablet  Every 4 hours PRN        04/02/24 1303             Note:  This document was prepared using Dragon voice recognition software and may include unintentional dictation errors.   Willo Dunnings, MD 04/02/24 1304  "

## 2024-04-03 ENCOUNTER — Ambulatory Visit

## 2024-04-03 ENCOUNTER — Ambulatory Visit: Payer: Self-pay

## 2024-04-03 NOTE — Telephone Encounter (Signed)
 Patient called back in reschedule appointment from 2/10 due to transportation to 2/12

## 2024-04-03 NOTE — Telephone Encounter (Signed)
 FYI Only or Action Required?: FYI only for provider: appointment scheduled on 04/07/24.  Patient was last seen in primary care on 03/20/2024 by Gareth Mliss FALCON, FNP.  Called Nurse Triage reporting Groin Pain.  Symptoms began several days ago.  Interventions attempted: Prescription medications: Lidocaine  patches, oxycodone .  Symptoms are: stable.  Triage Disposition: See PCP When Office is Open (Within 3 Days)  Patient/caregiver understands and will follow disposition?: Yes   Reason for Disposition  [1] MODERATE pain (e.g., interferes with normal activities, limping) AND [2] present > 3 days  Answer Assessment - Initial Assessment Questions Pt evaluated in ED yesterday and was only given an xray for lumbar spine. Pt reports low back pain is from using a walker as she is having difficulty walking. Discharged with Lidocaine  patches and Oxycodone , reports relief with those medications. Pt states she is very upset the ED did nothing for the hip area. She has an appointment with orthopedist who did left hip replacement, not until March.   1. ONSET: When did the pain start?      A week ago  2. LOCATION: Where is the pain located?      Right groin, thigh, hip and lower back  3. PAIN: How bad is the pain?    (Scale 1-10; or mild, moderate, severe)     10/10  4. OTHER SYMPTOMS: Do you have any other symptoms? (e.g., chest pain, back pain, breathing difficulty, swelling, rash, fever, numbness, weakness)     Denies  Protocols used: Leg Pain-A-AH  Message from Tiffini S sent at 04/03/2024  1:35 PM EST  Reason for Triage: Severe pain in her leg right groin, thigh and buttock could be her hip- went to hospital yesterday and the checked for lower part of her back

## 2024-04-07 ENCOUNTER — Inpatient Hospital Stay: Admitting: Nurse Practitioner

## 2024-04-07 ENCOUNTER — Ambulatory Visit

## 2024-04-08 ENCOUNTER — Ambulatory Visit

## 2024-04-09 ENCOUNTER — Ambulatory Visit

## 2024-04-09 ENCOUNTER — Inpatient Hospital Stay: Admitting: Nurse Practitioner

## 2024-04-14 ENCOUNTER — Telehealth

## 2024-07-22 ENCOUNTER — Ambulatory Visit: Admitting: Nurse Practitioner

## 2025-04-01 ENCOUNTER — Ambulatory Visit
